# Patient Record
Sex: Male | Born: 1935 | Race: Black or African American | Hispanic: No | Marital: Married | Smoking: Former smoker
Health system: Southern US, Community
[De-identification: ages and names within clinical notes are randomized; demographics above are authoritative.]

## PROBLEM LIST (undated history)

## (undated) ENCOUNTER — Emergency Department (HOSPITAL_COMMUNITY): Admission: EM | Payer: No Typology Code available for payment source | Source: Home / Self Care

## (undated) DIAGNOSIS — A281 Cat-scratch disease: Secondary | ICD-10-CM

## (undated) DIAGNOSIS — R0600 Dyspnea, unspecified: Secondary | ICD-10-CM

## (undated) DIAGNOSIS — I4891 Unspecified atrial fibrillation: Secondary | ICD-10-CM

## (undated) DIAGNOSIS — I10 Essential (primary) hypertension: Secondary | ICD-10-CM

## (undated) DIAGNOSIS — N200 Calculus of kidney: Secondary | ICD-10-CM

## (undated) DIAGNOSIS — I480 Paroxysmal atrial fibrillation: Secondary | ICD-10-CM

## (undated) DIAGNOSIS — H269 Unspecified cataract: Secondary | ICD-10-CM

## (undated) DIAGNOSIS — L304 Erythema intertrigo: Secondary | ICD-10-CM

## (undated) DIAGNOSIS — I509 Heart failure, unspecified: Secondary | ICD-10-CM

## (undated) DIAGNOSIS — J9600 Acute respiratory failure, unspecified whether with hypoxia or hypercapnia: Secondary | ICD-10-CM

## (undated) DIAGNOSIS — E669 Obesity, unspecified: Secondary | ICD-10-CM

## (undated) DIAGNOSIS — N179 Acute kidney failure, unspecified: Secondary | ICD-10-CM

## (undated) DIAGNOSIS — L84 Corns and callosities: Secondary | ICD-10-CM

## (undated) DIAGNOSIS — N189 Chronic kidney disease, unspecified: Secondary | ICD-10-CM

## (undated) DIAGNOSIS — M1990 Unspecified osteoarthritis, unspecified site: Secondary | ICD-10-CM

## (undated) DIAGNOSIS — Z87448 Personal history of other diseases of urinary system: Secondary | ICD-10-CM

## (undated) DIAGNOSIS — E7800 Pure hypercholesterolemia, unspecified: Secondary | ICD-10-CM

## (undated) DIAGNOSIS — M2140 Flat foot [pes planus] (acquired), unspecified foot: Secondary | ICD-10-CM

## (undated) DIAGNOSIS — N289 Disorder of kidney and ureter, unspecified: Secondary | ICD-10-CM

## (undated) DIAGNOSIS — I429 Cardiomyopathy, unspecified: Secondary | ICD-10-CM

## (undated) DIAGNOSIS — M109 Gout, unspecified: Secondary | ICD-10-CM

## (undated) DIAGNOSIS — E119 Type 2 diabetes mellitus without complications: Secondary | ICD-10-CM

## (undated) DIAGNOSIS — I428 Other cardiomyopathies: Secondary | ICD-10-CM

## (undated) HISTORY — PX: CIRCUMCISION: SUR203

## (undated) HISTORY — DX: Cardiomyopathy, unspecified: I42.9

## (undated) HISTORY — DX: Erythema intertrigo: L30.4

## (undated) HISTORY — DX: Personal history of other diseases of urinary system: Z87.448

## (undated) HISTORY — DX: Obesity, unspecified: E66.9

## (undated) HISTORY — DX: Gout, unspecified: M10.9

## (undated) HISTORY — PX: INGUINAL HERNIA REPAIR: SUR1180

## (undated) HISTORY — PX: INCISION AND DRAINAGE OF WOUND: SHX1803

## (undated) HISTORY — PX: KIDNEY STONE SURGERY: SHX686

## (undated) HISTORY — PX: TONSILLECTOMY AND ADENOIDECTOMY: SUR1326

## (undated) HISTORY — DX: Unspecified atrial fibrillation: I48.91

## (undated) HISTORY — DX: Flat foot (pes planus) (acquired), unspecified foot: M21.40

## (undated) HISTORY — DX: Corns and callosities: L84

## (undated) HISTORY — PX: CARDIAC CATHETERIZATION: SHX172

## (undated) NOTE — *Deleted (*Deleted)
Transition of Care (TOC) - Progression Note    Patient Details  Name: Ryan Macdonald MRN: [id number] Date of Birth: 04/29/1934  Transition of Care (TOC) CM/SW Contact  Ryan Ditommaso Hudson, RN Phone Number: 10/06/2020, 2:22 PM  Clinical Narrative:    NCM received call from [facility] admission liaison , Allison. Allison explained to NCM pt with limited VA benefits, 40% service connected. Pt needs @ least 70% service connection in order to receive SNF benefit. Allison explained pt can used his Medicare part A for SNF placement.  Information explained to wife. Wife stated pt recently d/c to [facility] and is now owning $ 1900.00. Pt can't return unless $ 1900.00 is paid prior. Wife states can't afford. Wife give permission for [facility] admission to use Medicare for admit. NCM made [facility] aware. [facility] admission is now reviewing for potential admission under pt's Medicare    Ryan Macdonald (Spouse)      [phone]      TOC team will continue to monitor.....  10/07/2020 1400 NCM called and spoke with [facility] admission liaison, Allison , regarding pt's admission to [facility]. Allison stated business office manager to speak with wife about applying for Medicaid ... hoping pt can potentially qualify before extending acceptance to center.  10/07/2020 1637 NCM received call from [facility] admission. SNF bed will be available for pt tomorrow after 2pm. Wife is aware.  Expected Discharge Plan: Skilled Nursing Facility Barriers to Discharge: No SNF bed  Expected Discharge Plan and Services Expected Discharge Plan: Skilled Nursing Facility       Living arrangements for the past 2 months: Single Family Home                                       Social Determinants of Health (SDOH) Interventions    Readmission Risk Interventions Readmission Risk Prevention Plan 09/25/2020  Transportation Screening Complete  HRI or Home Care Consult  Complete  Social Work Consult for Recovery Care Planning/Counseling Complete  Palliative Care Screening Not Applicable  Medication Review (RN Care Manager) Complete  Some recent data might be hidden   

---

## 2013-10-11 ENCOUNTER — Encounter (HOSPITAL_COMMUNITY): Payer: Self-pay | Admitting: Pharmacy Technician

## 2013-10-17 ENCOUNTER — Encounter (HOSPITAL_COMMUNITY): Payer: Self-pay

## 2013-10-17 ENCOUNTER — Ambulatory Visit (HOSPITAL_COMMUNITY)
Admission: RE | Admit: 2013-10-17 | Discharge: 2013-10-17 | Disposition: A | Discharge status: Home / Self Care | Payer: Non-veteran care | Source: Ambulatory Visit | Attending: Orthopaedic Surgery | Admitting: Orthopaedic Surgery

## 2013-10-17 ENCOUNTER — Encounter (HOSPITAL_COMMUNITY)
Admission: RE | Admit: 2013-10-17 | Discharge: 2013-10-17 | Disposition: A | Discharge status: Home / Self Care | Payer: Non-veteran care | Source: Ambulatory Visit | Attending: Orthopaedic Surgery | Admitting: Orthopaedic Surgery

## 2013-10-17 DIAGNOSIS — Z01818 Encounter for other preprocedural examination: Secondary | ICD-10-CM | POA: Insufficient documentation

## 2013-10-17 DIAGNOSIS — I129 Hypertensive chronic kidney disease with stage 1 through stage 4 chronic kidney disease, or unspecified chronic kidney disease: Secondary | ICD-10-CM | POA: Insufficient documentation

## 2013-10-17 DIAGNOSIS — Z87891 Personal history of nicotine dependence: Secondary | ICD-10-CM | POA: Insufficient documentation

## 2013-10-17 DIAGNOSIS — N189 Chronic kidney disease, unspecified: Secondary | ICD-10-CM | POA: Insufficient documentation

## 2013-10-17 DIAGNOSIS — I509 Heart failure, unspecified: Secondary | ICD-10-CM | POA: Insufficient documentation

## 2013-10-17 HISTORY — DX: Essential (primary) hypertension: I10

## 2013-10-17 HISTORY — DX: Cat-scratch disease: A28.1

## 2013-10-17 HISTORY — DX: Chronic kidney disease, unspecified: N18.9

## 2013-10-17 HISTORY — DX: Unspecified cataract: H26.9

## 2013-10-17 HISTORY — DX: Heart failure, unspecified: I50.9

## 2013-10-17 LAB — COMPREHENSIVE METABOLIC PANEL
AST: 23 U/L (ref 0–37)
Albumin: 3.6 g/dL (ref 3.5–5.2)
Alkaline Phosphatase: 60 U/L (ref 39–117)
CO2: 27 mEq/L (ref 19–32)
Calcium: 9.7 mg/dL (ref 8.4–10.5)
Chloride: 100 mEq/L (ref 96–112)
Creatinine, Ser: 1.25 mg/dL (ref 0.50–1.35)
GFR calc non Af Amer: 54 mL/min — ABNORMAL LOW (ref >90)
Potassium: 4.1 mEq/L (ref 3.5–5.1)
Sodium: 141 mEq/L (ref 135–145)
Total Protein: 8.3 g/dL (ref 6.0–8.3)

## 2013-10-17 LAB — URINALYSIS, ROUTINE W REFLEX MICROSCOPIC
Specific Gravity, Urine: 1.014 (ref 1.005–1.030)
Urobilinogen, UA: 0.2 mg/dL (ref 0.0–1.0)

## 2013-10-17 LAB — TYPE AND SCREEN
ABO/RH(D): O POS
Antibody Screen: NEGATIVE

## 2013-10-17 LAB — CBC
MCH: 32 pg (ref 26.0–34.0)
MCV: 93.5 fL (ref 78.0–100.0)
Platelets: 264 10*3/uL (ref 150–400)
RDW: 13.4 % (ref 11.5–15.5)
WBC: 8.2 10*3/uL (ref 4.0–10.5)

## 2013-10-17 LAB — URINE MICROSCOPIC-ADD ON

## 2013-10-17 LAB — SURGICAL PCR SCREEN
MRSA, PCR: NEGATIVE
Staphylococcus aureus: NEGATIVE

## 2013-10-17 LAB — PROTIME-INR: INR: 0.99 (ref 0.00–1.49)

## 2013-10-17 LAB — ABO/RH: ABO/RH(D): O POS

## 2013-10-17 NOTE — Progress Notes (Signed)
Have requested records from the [place] in [place]...da

## 2013-10-17 NOTE — Pre-Procedure Instructions (Signed)
Ryan Macdonald  10/17/2013   Your procedure is scheduled on: Tuesday, November 25th   Report to [facility]  2 * 3 at 5:30 AM.             (FREE Valet Parking)   Call this number if you have problems the morning of surgery: [phone]   Remember:   Do not eat food or drink liquids after midnight Tuesday.   Take these medicines the morning of surgery with A SIP OF WATER: Carvedilol (Coreg), Isosorbide, Tramadol   Do not wear jewelry, (it will NOT be worn into surgery)  Do not wear lotions, powders, or colognes. You may wear deodorant.   Men may shave face and neck.   Do not bring valuables to the hospital.  [facility] is not responsible for any belongings or valuables.               Contacts, dentures or bridgework may not be worn into surgery.  Leave suitcase in the car. After surgery it may be brought to your room.  For patients admitted to the hospital, discharge time is determined by your treatment team.                 Name and phone number of your driver:    Special Instructions: Shower using CHG 2 nights before surgery and the night before surgery.  If you shower the day of surgery use CHG.  Use special wash - you have one bottle of CHG for all showers.  You should use approximately 1/3 of the bottle for each shower.   Please read over the following fact sheets that you were given: Pain Booklet, Blood Transfusion Information, MRSA Information and Surgical Site Infection Prevention

## 2013-10-17 NOTE — Progress Notes (Signed)
10/17/13 1342  OBSTRUCTIVE SLEEP APNEA  Have you ever been diagnosed with sleep apnea through a sleep study? No  Do you snore loudly (loud enough to be heard through closed doors)?  0  Do you often feel tired, fatigued, or sleepy during the daytime? 0  Has anyone observed you stop breathing during your sleep? 0  Do you have, or are you being treated for high blood pressure? 1  BMI more than 35 kg/m2? 1  Age over 77 years old? 1  Neck circumference greater than 40 cm/18 inches? 1  Gender: 1  Obstructive Sleep Apnea Score 5  Score 4 or greater  Results sent to PCP  He is at risk for OSA

## 2013-10-18 LAB — URINE CULTURE

## 2013-10-18 NOTE — Progress Notes (Signed)
Anesthesia Chart Review:  Patient is a 79 year old male scheduled for left THR on 10/23/13 by Dr. Cleophas Dunker.  History includes morbid obesity, former smoker, CHF with EF 25% by 10/2011 echo, HTN, CKD, nephrolithiasis, Cat Scratch Fever, bilateral cataracts, tonsillectomy, right inguinal hernia repair.  He denies CAD history and reports no CAD by cath > 5 years ago.  OSA screening score was 5. He receives care thru the [facility] PCP is Dr. Randa Ngo.  Patient had a preoperative cardiology evaluation at [facility] on 08/31/13 by Dr. Eliezer Lofts [place]/Dr. Lynnea Ferrier. Patient was NYHA Class II, functional status > 4 METS, and refused an AICD so no further testing was recommended.  By notes, "...he is MODERATE risk for peri-operative cardiac complications for a MODERATE risk surgery, he may proceed to surgery understanding that his risk of events is higher than average."  EKG on 10/17/13 showed NSR with first degree AVB, nonspecific T wave abnormality.  Echo on 11/01/11 showed LV function is severely reduced, EF 25%. RV systolic function is mildly reduced.  Trace AR. Mild to moderate MR. Trace PR. Mild TR.   He reports he became acutely SOB > 5 years ago and was hospitalized and diagnosed with CHF at that time.  Cardiac cath was done then and reportedly showed no CAD.  He has not had a recent stress test.  He denies any new symptoms since his preoperative cardiology visit last month.  CXR on 10/17/13 showed: Heart size is normal. The aorta is unfolded. The pulmonary vascularity is normal. The lungs are clear. No effusions. Chronic degenerative changes affect the spine. No active disease.  Preoperative labs noted.  Urine culture showed no growth.    History and labs reviewed with anesthesiologist Dr. Katrinka Blazing.  Defer any additional lab orders to surgeon.  Patient has been cleared with moderate risk by cardiology.  Further evaluation by his assigned anesthesiologist on the day of surgery, but if no acute  changes or CHF symptoms then it is anticipated that he can proceed as planned.  Of note, patient has concerns about Rehab options. He is also on ASA 81 mg.  I have left a message with Selena Batten at Dr. Hoy Register office to call him if ASA should be held and also to have someone provide follow-up regarding post-operative rehab.   Velna Ochs [facility]/Anesthesiology Phone [phone] 10/18/2013 4:55 PM

## 2013-10-18 NOTE — H&P (Signed)
CHIEF COMPLAINT:  Painful left hip.   HISTORY OF PRESENT ILLNESS:  Ryan Macdonald is a very pleasant 79 year old African American male who was seen today for evaluation of his left hip.  He has had problems with his left hip in regard to pain probably for over the last 15-18 years.  He was initially treated in the [facility] in [place] and also [place], [place].  At one point he actually suggested that he undergo a replacement, but he was not ready any of those times.  He was just having so much pain and inconvenience now that he returns for further evaluation.  He has pain in the left groin and some referred to the left knee just proximal to it.  He is not really having much in the way of numbness and tingling.  He has to walk with a cane now.  He has pain with every step.  He has markedly limited range of motion secondary to pain.  He denies any neurovascular compromise at this time.  Seen today for re-evaluation.  Health history has been reviewed.    PAST SURGICAL HISTORY:   In 1955 right inguinal herniorrhaphy, 1975 for renal calculi, 1983 renal calculi, 1989 for a cat scratch mass on the right neck with excision.   CURRENT MEDICATIONS:   Tylenol 500 mg 1 tablet twice a day, Carvedilol 25 mg 1/2 tab b.i.d., B12, 1000 mg 2 daily, furosemide 40 mg 1 b.i.d., hydralazine 10 mg 3 times a day, isosorbide 40 mg 1/2 tablet 3 times a day, tramadol 50 mg 4 times a day, tamsulosin 0.4 mg HS, spironolactone 25 mg HS, simvastatin 80 mg 1/2 tablet nightly, Niacin-Slo at 1 tablet at night.   ALLERGIES:  Lisinopril.   REVIEW OF SYSTEMS:   A 14-point review of systems is positive for bilateral cataracts without excision at this time.  He does wear glasses and has a full set of dentures.  He has had congestive heart failure for 12 years, they state 75%.  Hypertension over the past 12 years which apparently is now controlled.  Previous history of whooping cough.  He did have renal calculi x2.  Bladder infection 2 months  ago.  Nocturia about 4 times per evening.  He also has some mild depression.   FAMILY HISTORY:   Positive for a mother who died age 74 from a cerebral hemorrhage.  Father is deceased at 20+ with pneumothorax on the left.  Brother who died at 63 months of age, crib death.  He has 2 sisters, 1 who died at age 73 from lupus and renal failure and the other one died at the age of 60+ from diabetes, blindness, congestive heart failure and then bled to death apparently after amputation.   SOCIAL HISTORY:   Ryan Macdonald is a very pleasant 79 year old African American male who is married.  He drinks beer about 12 a month, but does not use tobacco.   PHYSICAL EXAMINATION:  A 79 year old [place] American male, well-developed, well-nourished, obese, alert, pleasant, cooperative in moderate distress secondary to left hip and groin pain.     Height is 65 inches, weight 266 pounds, BMI is 44.3.    Vital signs reveals a temperature of 98, pulse 80, respirations 16, blood pressure 130/80.   Head is normocephalic.   Eyes:  Pupils equal, round and reactive to light and accommodation with extraocular movements intact.   Ears, nose and throat were benign. Neck was supple, no thyromegaly.  No bruits were noted. Chest had  good expansion. Lungs had distant breath sounds, but were clear.   Cardiac had a regular rhythm and rate, distant heart sounds. Pulses were trace in the lower extremities at best. Abdomen is obese, soft, nontender, no mass is palpable.  Normal bowel sounds were present.  Genital, rectal and breast exam was not indicated for a orthopedic evaluation. CNS:  He appears to be oriented x3 and cranial nerves II-XII grossly intact.    Musculoskeletal:  At this time he has very limited internal and external rotation of the left hip.  I can flex him to about 90 degrees.  He walks with the left foot externally rotated.     CLINICAL IMPRESSION:   1.  Advanced DJD of the left hip. 2.  History of congestive  heart failure. 3.  History of hypertension. 4.  Recent bladder infection. 5.  History of depression.   RECOMMENDATIONS:  At this time it is felt that he is a candidate for a total hip replacement on the left.  Procedure, risks and benefits were fully explained to him.  He is understanding.  I spent 45 minutes with him going over the entire procedure using appropriate models to show him what would be done and the prosthesis that would be used.  All questions were answered.  He would like to proceed with this in the very near future.   Ryan Macdonald [facility] [phone]  10/18/2013 4:02 PM

## 2013-10-22 MED ORDER — SODIUM CHLORIDE 0.9 % IV SOLN
INTRAVENOUS | Status: DC
  Administered 2013-10-23: 75 mL/h via INTRAVENOUS

## 2013-10-22 MED ORDER — DEXTROSE 5 % IV SOLN
3.0000 g | INTRAVENOUS | Status: AC
  Administered 2013-10-23: 3 g via INTRAVENOUS
  Filled 2013-10-22: qty 3000

## 2013-10-22 MED ORDER — CHLORHEXIDINE GLUCONATE 4 % EX LIQD: 60.0000 mL | Freq: Once | CUTANEOUS | Status: DC

## 2013-10-22 MED ORDER — CHLORHEXIDINE GLUCONATE 4 % EX LIQD: 60.0000 mL | Freq: Every day | CUTANEOUS | Status: DC

## 2013-10-23 ENCOUNTER — Encounter (HOSPITAL_COMMUNITY)
Admission: RE | Disposition: A | Discharge status: Home Health | Payer: Self-pay | Source: Ambulatory Visit | Attending: Orthopaedic Surgery

## 2013-10-23 ENCOUNTER — Inpatient Hospital Stay (HOSPITAL_COMMUNITY)
Admission: RE | Admit: 2013-10-23 | Discharge: 2013-10-26 | DRG: 470 | Disposition: A | Discharge status: Home Health | Payer: Medicare Other | Source: Ambulatory Visit | Attending: Orthopaedic Surgery | Admitting: Orthopaedic Surgery

## 2013-10-23 ENCOUNTER — Encounter (HOSPITAL_COMMUNITY): Payer: Self-pay | Admitting: Certified Registered Nurse Anesthetist

## 2013-10-23 ENCOUNTER — Inpatient Hospital Stay (HOSPITAL_COMMUNITY): Payer: Medicare Other | Admitting: Certified Registered Nurse Anesthetist

## 2013-10-23 ENCOUNTER — Inpatient Hospital Stay (HOSPITAL_COMMUNITY): Payer: Medicare Other

## 2013-10-23 ENCOUNTER — Encounter (HOSPITAL_COMMUNITY): Payer: Medicare Other | Admitting: Vascular Surgery

## 2013-10-23 DIAGNOSIS — H269 Unspecified cataract: Secondary | ICD-10-CM | POA: Diagnosis present

## 2013-10-23 DIAGNOSIS — Z6841 Body Mass Index (BMI) 40.0 and over, adult: Secondary | ICD-10-CM

## 2013-10-23 DIAGNOSIS — Z87442 Personal history of urinary calculi: Secondary | ICD-10-CM

## 2013-10-23 DIAGNOSIS — F329 Major depressive disorder, single episode, unspecified: Secondary | ICD-10-CM | POA: Diagnosis present

## 2013-10-23 DIAGNOSIS — E669 Obesity, unspecified: Secondary | ICD-10-CM | POA: Diagnosis present

## 2013-10-23 DIAGNOSIS — I480 Paroxysmal atrial fibrillation: Secondary | ICD-10-CM | POA: Diagnosis present

## 2013-10-23 DIAGNOSIS — I509 Heart failure, unspecified: Secondary | ICD-10-CM | POA: Diagnosis present

## 2013-10-23 DIAGNOSIS — E7800 Pure hypercholesterolemia, unspecified: Secondary | ICD-10-CM | POA: Diagnosis present

## 2013-10-23 DIAGNOSIS — N401 Enlarged prostate with lower urinary tract symptoms: Secondary | ICD-10-CM | POA: Diagnosis present

## 2013-10-23 DIAGNOSIS — Z96649 Presence of unspecified artificial hip joint: Secondary | ICD-10-CM

## 2013-10-23 DIAGNOSIS — M1612 Unilateral primary osteoarthritis, left hip: Secondary | ICD-10-CM | POA: Diagnosis present

## 2013-10-23 DIAGNOSIS — I4891 Unspecified atrial fibrillation: Secondary | ICD-10-CM | POA: Diagnosis present

## 2013-10-23 DIAGNOSIS — I10 Essential (primary) hypertension: Secondary | ICD-10-CM | POA: Diagnosis present

## 2013-10-23 DIAGNOSIS — I428 Other cardiomyopathies: Secondary | ICD-10-CM | POA: Diagnosis present

## 2013-10-23 DIAGNOSIS — N138 Other obstructive and reflux uropathy: Secondary | ICD-10-CM | POA: Diagnosis present

## 2013-10-23 DIAGNOSIS — M1610 Unilateral primary osteoarthritis, unspecified hip: Principal | ICD-10-CM | POA: Diagnosis present

## 2013-10-23 DIAGNOSIS — Z79899 Other long term (current) drug therapy: Secondary | ICD-10-CM

## 2013-10-23 DIAGNOSIS — IMO0002 Reserved for concepts with insufficient information to code with codable children: Secondary | ICD-10-CM | POA: Diagnosis present

## 2013-10-23 DIAGNOSIS — F3289 Other specified depressive episodes: Secondary | ICD-10-CM | POA: Diagnosis present

## 2013-10-23 DIAGNOSIS — Z87891 Personal history of nicotine dependence: Secondary | ICD-10-CM

## 2013-10-23 DIAGNOSIS — Z7901 Long term (current) use of anticoagulants: Secondary | ICD-10-CM

## 2013-10-23 DIAGNOSIS — D62 Acute posthemorrhagic anemia: Secondary | ICD-10-CM | POA: Diagnosis not present

## 2013-10-23 DIAGNOSIS — M169 Osteoarthritis of hip, unspecified: Principal | ICD-10-CM | POA: Diagnosis present

## 2013-10-23 DIAGNOSIS — Z833 Family history of diabetes mellitus: Secondary | ICD-10-CM

## 2013-10-23 HISTORY — DX: Calculus of kidney: N20.0

## 2013-10-23 HISTORY — PX: TOTAL HIP ARTHROPLASTY: SHX124

## 2013-10-23 HISTORY — PX: CYSTOSCOPY WITH URETHRAL DILATATION: SHX5125

## 2013-10-23 HISTORY — DX: Other cardiomyopathies: I42.8

## 2013-10-23 HISTORY — DX: Unspecified osteoarthritis, unspecified site: M19.90

## 2013-10-23 HISTORY — DX: Pure hypercholesterolemia, unspecified: E78.00

## 2013-10-23 SURGERY — ARTHROPLASTY, HIP, TOTAL,POSTERIOR APPROACH
Anesthesia: General | Site: Penis

## 2013-10-23 MED ORDER — SPIRONOLACTONE 25 MG PO TABS
25.0000 mg | ORAL_TABLET | Freq: Every day | ORAL | Status: DC
  Administered 2013-10-23 – 2013-10-25 (×3): 25 mg via ORAL
  Filled 2013-10-23 (×4): qty 1

## 2013-10-23 MED ORDER — ONDANSETRON HCL 4 MG/2ML IJ SOLN
INTRAMUSCULAR | Status: DC | PRN
  Administered 2013-10-23: 4 mg via INTRAVENOUS

## 2013-10-23 MED ORDER — METHOCARBAMOL 100 MG/ML IJ SOLN
500.0000 mg | Freq: Four times a day (QID) | INTRAVENOUS | Status: DC | PRN
  Administered 2013-10-23: 500 mg via INTRAVENOUS
  Filled 2013-10-23: qty 5

## 2013-10-23 MED ORDER — PROMETHAZINE HCL 25 MG/ML IJ SOLN: 6.2500 mg | INTRAMUSCULAR | Status: DC | PRN

## 2013-10-23 MED ORDER — ALBUMIN HUMAN 5 % IV SOLN
INTRAVENOUS | Status: DC | PRN
  Administered 2013-10-23: 09:00:00 via INTRAVENOUS

## 2013-10-23 MED ORDER — BISACODYL 10 MG RE SUPP: 10.0000 mg | Freq: Every day | RECTAL | Status: DC | PRN

## 2013-10-23 MED ORDER — RIVAROXABAN 10 MG PO TABS
10.0000 mg | ORAL_TABLET | ORAL | Status: DC
  Administered 2013-10-23: 10 mg via ORAL
  Filled 2013-10-23 (×2): qty 1

## 2013-10-23 MED ORDER — OXYCODONE HCL 5 MG PO TABS
5.0000 mg | ORAL_TABLET | ORAL | Status: DC | PRN
  Administered 2013-10-23 – 2013-10-26 (×9): 10 mg via ORAL
  Filled 2013-10-23 (×4): qty 2
  Filled 2013-10-23: qty 1
  Filled 2013-10-23: qty 2
  Filled 2013-10-23: qty 1
  Filled 2013-10-23 (×3): qty 2

## 2013-10-23 MED ORDER — BUPIVACAINE-EPINEPHRINE (PF) 0.25% -1:200000 IJ SOLN
INTRAMUSCULAR | Status: AC
  Filled 2013-10-23: qty 30

## 2013-10-23 MED ORDER — ALUM & MAG HYDROXIDE-SIMETH 200-200-20 MG/5ML PO SUSP: 30.0000 mL | ORAL | Status: DC | PRN

## 2013-10-23 MED ORDER — ACETAMINOPHEN 325 MG PO TABS: 650.0000 mg | ORAL_TABLET | Freq: Four times a day (QID) | ORAL | Status: DC | PRN

## 2013-10-23 MED ORDER — ONDANSETRON HCL 4 MG/2ML IJ SOLN: 4.0000 mg | Freq: Four times a day (QID) | INTRAMUSCULAR | Status: DC | PRN

## 2013-10-23 MED ORDER — CEFAZOLIN SODIUM-DEXTROSE 2-3 GM-% IV SOLR
2.0000 g | Freq: Four times a day (QID) | INTRAVENOUS | Status: AC
  Administered 2013-10-23 (×2): 2 g via INTRAVENOUS
  Filled 2013-10-23 (×2): qty 50

## 2013-10-23 MED ORDER — CARVEDILOL 25 MG PO TABS
25.0000 mg | ORAL_TABLET | Freq: Two times a day (BID) | ORAL | Status: DC
  Administered 2013-10-23 – 2013-10-26 (×6): 25 mg via ORAL
  Filled 2013-10-23 (×8): qty 1

## 2013-10-23 MED ORDER — OXYCODONE HCL 5 MG/5ML PO SOLN: 5.0000 mg | Freq: Once | ORAL | Status: DC | PRN

## 2013-10-23 MED ORDER — DOCUSATE SODIUM 100 MG PO CAPS
100.0000 mg | ORAL_CAPSULE | Freq: Two times a day (BID) | ORAL | Status: DC
  Administered 2013-10-23 – 2013-10-26 (×7): 100 mg via ORAL
  Filled 2013-10-23 (×8): qty 1

## 2013-10-23 MED ORDER — GLYCOPYRROLATE 0.2 MG/ML IJ SOLN
INTRAMUSCULAR | Status: DC | PRN
  Administered 2013-10-23: .8 mg via INTRAVENOUS

## 2013-10-23 MED ORDER — STERILE WATER FOR IRRIGATION IR SOLN
Status: DC | PRN
  Administered 2013-10-23: 1000 mL

## 2013-10-23 MED ORDER — MENTHOL 3 MG MT LOZG: 1.0000 | LOZENGE | OROMUCOSAL | Status: DC | PRN

## 2013-10-23 MED ORDER — LIDOCAINE HCL (CARDIAC) 20 MG/ML IV SOLN
INTRAVENOUS | Status: DC | PRN
  Administered 2013-10-23: 80 mg via INTRAVENOUS

## 2013-10-23 MED ORDER — ISOSORBIDE DINITRATE ER 40 MG PO TBCR
40.0000 mg | EXTENDED_RELEASE_TABLET | Freq: Three times a day (TID) | ORAL | Status: DC
  Administered 2013-10-23: 40 mg via ORAL
  Filled 2013-10-23 (×6): qty 1

## 2013-10-23 MED ORDER — NIACIN ER (ANTIHYPERLIPIDEMIC) 500 MG PO TBCR: 500.0000 mg | EXTENDED_RELEASE_TABLET | Freq: Every day | ORAL | Status: DC

## 2013-10-23 MED ORDER — TAMSULOSIN HCL 0.4 MG PO CAPS
0.4000 mg | ORAL_CAPSULE | Freq: Every day | ORAL | Status: DC
  Administered 2013-10-23 – 2013-10-25 (×3): 0.4 mg via ORAL
  Filled 2013-10-23 (×4): qty 1

## 2013-10-23 MED ORDER — PHENYLEPHRINE HCL 10 MG/ML IJ SOLN
INTRAMUSCULAR | Status: DC | PRN
  Administered 2013-10-23 (×3): 80 ug via INTRAVENOUS
  Administered 2013-10-23 (×2): 40 ug via INTRAVENOUS
  Administered 2013-10-23: 80 ug via INTRAVENOUS

## 2013-10-23 MED ORDER — PHENYLEPHRINE HCL 10 MG/ML IJ SOLN
10.0000 mg | INTRAVENOUS | Status: DC | PRN
  Administered 2013-10-23: 25 ug/min via INTRAVENOUS

## 2013-10-23 MED ORDER — SODIUM CHLORIDE 0.9 % IV SOLN
75.0000 mL/h | INTRAVENOUS | Status: DC
  Administered 2013-10-23 – 2013-10-24 (×2): 75 mL/h via INTRAVENOUS

## 2013-10-23 MED ORDER — METOCLOPRAMIDE HCL 5 MG/ML IJ SOLN: 5.0000 mg | Freq: Three times a day (TID) | INTRAMUSCULAR | Status: DC | PRN

## 2013-10-23 MED ORDER — 0.9 % SODIUM CHLORIDE (POUR BTL) OPTIME
TOPICAL | Status: DC | PRN
  Administered 2013-10-23 (×2): 1000 mL

## 2013-10-23 MED ORDER — NIACIN ER 500 MG PO CPCR
500.0000 mg | ORAL_CAPSULE | Freq: Every day | ORAL | Status: DC
  Administered 2013-10-23 – 2013-10-25 (×3): 500 mg via ORAL
  Filled 2013-10-23 (×4): qty 1

## 2013-10-23 MED ORDER — NEOSTIGMINE METHYLSULFATE 1 MG/ML IJ SOLN
INTRAMUSCULAR | Status: DC | PRN
  Administered 2013-10-23: 5 mg via INTRAVENOUS

## 2013-10-23 MED ORDER — HYDROMORPHONE HCL PF 1 MG/ML IJ SOLN: 0.5000 mg | INTRAMUSCULAR | Status: DC | PRN

## 2013-10-23 MED ORDER — ONDANSETRON HCL 4 MG PO TABS: 4.0000 mg | ORAL_TABLET | Freq: Four times a day (QID) | ORAL | Status: DC | PRN

## 2013-10-23 MED ORDER — KETOROLAC TROMETHAMINE 15 MG/ML IJ SOLN
7.5000 mg | Freq: Four times a day (QID) | INTRAMUSCULAR | Status: AC
  Administered 2013-10-23 – 2013-10-24 (×4): 7.5 mg via INTRAVENOUS
  Filled 2013-10-23 (×3): qty 1

## 2013-10-23 MED ORDER — ACETAMINOPHEN 650 MG RE SUPP: 650.0000 mg | Freq: Four times a day (QID) | RECTAL | Status: DC | PRN

## 2013-10-23 MED ORDER — METHOCARBAMOL 500 MG PO TABS
500.0000 mg | ORAL_TABLET | Freq: Four times a day (QID) | ORAL | Status: DC | PRN
  Administered 2013-10-26: 500 mg via ORAL
  Filled 2013-10-23 (×2): qty 1

## 2013-10-23 MED ORDER — HYDROMORPHONE HCL PF 1 MG/ML IJ SOLN
0.2500 mg | INTRAMUSCULAR | Status: DC | PRN
  Administered 2013-10-23 (×2): 0.5 mg via INTRAVENOUS

## 2013-10-23 MED ORDER — KETOROLAC TROMETHAMINE 30 MG/ML IJ SOLN
INTRAMUSCULAR | Status: AC
  Filled 2013-10-23: qty 1

## 2013-10-23 MED ORDER — FLEET ENEMA 7-19 GM/118ML RE ENEM: 1.0000 | ENEMA | Freq: Once | RECTAL | Status: AC | PRN

## 2013-10-23 MED ORDER — FUROSEMIDE 40 MG PO TABS
40.0000 mg | ORAL_TABLET | Freq: Two times a day (BID) | ORAL | Status: DC
  Administered 2013-10-23 – 2013-10-26 (×6): 40 mg via ORAL
  Filled 2013-10-23 (×8): qty 1

## 2013-10-23 MED ORDER — MAGNESIUM HYDROXIDE 400 MG/5ML PO SUSP
30.0000 mL | Freq: Every day | ORAL | Status: DC | PRN
  Administered 2013-10-25: 30 mL via ORAL
  Filled 2013-10-23: qty 30

## 2013-10-23 MED ORDER — PHENOL 1.4 % MT LIQD: 1.0000 | OROMUCOSAL | Status: DC | PRN

## 2013-10-23 MED ORDER — FENTANYL CITRATE 0.05 MG/ML IJ SOLN
INTRAMUSCULAR | Status: DC | PRN
  Administered 2013-10-23 (×2): 50 ug via INTRAVENOUS
  Administered 2013-10-23: 150 ug via INTRAVENOUS
  Administered 2013-10-23 (×3): 50 ug via INTRAVENOUS

## 2013-10-23 MED ORDER — ROCURONIUM BROMIDE 100 MG/10ML IV SOLN
INTRAVENOUS | Status: DC | PRN
  Administered 2013-10-23: 10 mg via INTRAVENOUS
  Administered 2013-10-23: 20 mg via INTRAVENOUS
  Administered 2013-10-23: 50 mg via INTRAVENOUS
  Administered 2013-10-23: 20 mg via INTRAVENOUS

## 2013-10-23 MED ORDER — PROPOFOL 10 MG/ML IV BOLUS
INTRAVENOUS | Status: DC | PRN
  Administered 2013-10-23: 150 mg via INTRAVENOUS

## 2013-10-23 MED ORDER — OXYCODONE HCL 5 MG PO TABS
5.0000 mg | ORAL_TABLET | Freq: Once | ORAL | Status: DC | PRN
Start: 2013-10-23 — End: 2013-10-23

## 2013-10-23 MED ORDER — HYDROMORPHONE HCL PF 1 MG/ML IJ SOLN
INTRAMUSCULAR | Status: AC
  Filled 2013-10-23: qty 1

## 2013-10-23 MED ORDER — BUPIVACAINE-EPINEPHRINE PF 0.25-1:200000 % IJ SOLN
INTRAMUSCULAR | Status: DC | PRN
  Administered 2013-10-23: 10 mL

## 2013-10-23 MED ORDER — LACTATED RINGERS IV SOLN
INTRAVENOUS | Status: DC | PRN
  Administered 2013-10-23: 07:00:00 via INTRAVENOUS

## 2013-10-23 MED ORDER — MIDAZOLAM HCL 5 MG/5ML IJ SOLN
INTRAMUSCULAR | Status: DC | PRN
  Administered 2013-10-23: 1 mg via INTRAVENOUS

## 2013-10-23 MED ORDER — METOCLOPRAMIDE HCL 10 MG PO TABS: 5.0000 mg | ORAL_TABLET | Freq: Three times a day (TID) | ORAL | Status: DC | PRN

## 2013-10-23 MED ORDER — HYDRALAZINE HCL 10 MG PO TABS
20.0000 mg | ORAL_TABLET | Freq: Two times a day (BID) | ORAL | Status: DC
  Administered 2013-10-23 – 2013-10-26 (×7): 20 mg via ORAL
  Filled 2013-10-23 (×8): qty 2

## 2013-10-23 SURGICAL SUPPLY — 68 items
BLADE SAW SAG 73X25 THK (BLADE) ×1
BLADE SAW SGTL 73X25 THK (BLADE) ×2 IMPLANT
BNDG COHESIVE 6X5 TAN STRL LF (GAUZE/BANDAGES/DRESSINGS) ×3 IMPLANT
BRUSH FEMORAL CANAL (MISCELLANEOUS) IMPLANT
CAPT HIP PF COP ×3 IMPLANT
CATH FOLEY 2W COUNCIL 5CC 18FR (CATHETERS) ×3 IMPLANT
CLOTH BEACON ORANGE TIMEOUT ST (SAFETY) ×3 IMPLANT
COVER BACK TABLE 24X17X13 BIG (DRAPES) IMPLANT
COVER SURGICAL LIGHT HANDLE (MISCELLANEOUS) ×3 IMPLANT
DRAPE INCISE IOBAN 66X45 STRL (DRAPES) ×3 IMPLANT
DRAPE ORTHO SPLIT 77X108 STRL (DRAPES) ×2
DRAPE SURG ORHT 6 SPLT 77X108 (DRAPES) ×4 IMPLANT
DRSG MEPILEX BORDER 4X12 (GAUZE/BANDAGES/DRESSINGS) ×3 IMPLANT
DURAPREP 26ML APPLICATOR (WOUND CARE) ×6 IMPLANT
ELECT BLADE 6.5 EXT (BLADE) ×3 IMPLANT
ELECT REM PT RETURN 9FT ADLT (ELECTROSURGICAL) ×3
ELECTRODE REM PT RTRN 9FT ADLT (ELECTROSURGICAL) ×2 IMPLANT
EVACUATOR 1/8 PVC DRAIN (DRAIN) IMPLANT
FACESHIELD LNG OPTICON STERILE (SAFETY) ×9 IMPLANT
GLOVE BIO SURGEON STRL SZ 6.5 (GLOVE) ×3 IMPLANT
GLOVE BIO SURGEON STRL SZ7 (GLOVE) ×3 IMPLANT
GLOVE BIOGEL PI IND STRL 7.0 (GLOVE) ×4 IMPLANT
GLOVE BIOGEL PI IND STRL 8 (GLOVE) ×4 IMPLANT
GLOVE BIOGEL PI IND STRL 8.5 (GLOVE) ×2 IMPLANT
GLOVE BIOGEL PI INDICATOR 7.0 (GLOVE) ×2
GLOVE BIOGEL PI INDICATOR 8 (GLOVE) ×2
GLOVE BIOGEL PI INDICATOR 8.5 (GLOVE) ×1
GLOVE BIOGEL PI ORTHO PRO SZ8 (GLOVE) ×1
GLOVE ECLIPSE 8.0 STRL XLNG CF (GLOVE) ×6 IMPLANT
GLOVE PI ORTHO PRO STRL SZ8 (GLOVE) ×2 IMPLANT
GLOVE SURG ORTHO 8.5 STRL (GLOVE) ×6 IMPLANT
GOWN PREVENTION PLUS XXLARGE (GOWN DISPOSABLE) ×6 IMPLANT
GOWN STRL NON-REIN LRG LVL3 (GOWN DISPOSABLE) ×6 IMPLANT
GOWN STRL REIN XL XLG (GOWN DISPOSABLE) ×6 IMPLANT
GUIDEWIRE STR DUAL SENSOR (WIRE) ×3 IMPLANT
HANDPIECE INTERPULSE COAX TIP (DISPOSABLE)
IMMOBILIZER KNEE 20 (SOFTGOODS)
IMMOBILIZER KNEE 20 THIGH 36 (SOFTGOODS) IMPLANT
IMMOBILIZER KNEE 22 UNIV (SOFTGOODS) ×3 IMPLANT
IMMOBILIZER KNEE 24 THIGH 36 (MISCELLANEOUS) IMPLANT
IMMOBILIZER KNEE 24 UNIV (MISCELLANEOUS)
KIT BASIN OR (CUSTOM PROCEDURE TRAY) ×3 IMPLANT
KIT ROOM TURNOVER OR (KITS) ×3 IMPLANT
MANIFOLD NEPTUNE II (INSTRUMENTS) ×3 IMPLANT
NEEDLE 22X1 1/2 (OR ONLY) (NEEDLE) ×3 IMPLANT
NS IRRIG 1000ML POUR BTL (IV SOLUTION) ×3 IMPLANT
PACK TOTAL JOINT (CUSTOM PROCEDURE TRAY) ×3 IMPLANT
PAD ARMBOARD 7.5X6 YLW CONV (MISCELLANEOUS) ×9 IMPLANT
PRESSURIZER FEMORAL UNIV (MISCELLANEOUS) IMPLANT
SET HNDPC FAN SPRY TIP SCT (DISPOSABLE) IMPLANT
STAPLER VISISTAT 35W (STAPLE) ×3 IMPLANT
SUCTION FRAZIER TIP 10 FR DISP (SUCTIONS) ×3 IMPLANT
SUT BONE WAX W31G (SUTURE) ×3 IMPLANT
SUT ETHIBOND NAB CT1 #1 30IN (SUTURE) ×9 IMPLANT
SUT MNCRL AB 3-0 PS2 18 (SUTURE) ×3 IMPLANT
SUT VIC AB 0 CT1 27 (SUTURE) ×1
SUT VIC AB 0 CT1 27XBRD ANBCTR (SUTURE) ×2 IMPLANT
SUT VIC AB 1 CT1 27 (SUTURE) ×1
SUT VIC AB 1 CT1 27XBRD ANBCTR (SUTURE) ×2 IMPLANT
SUT VIC AB 2-0 CT1 27 (SUTURE) ×1
SUT VIC AB 2-0 CT1 TAPERPNT 27 (SUTURE) ×2 IMPLANT
SYR CONTROL 10ML LL (SYRINGE) ×3 IMPLANT
TOWEL OR 17X24 6PK STRL BLUE (TOWEL DISPOSABLE) ×6 IMPLANT
TOWEL OR 17X26 10 PK STRL BLUE (TOWEL DISPOSABLE) ×3 IMPLANT
TOWER CARTRIDGE SMART MIX (DISPOSABLE) IMPLANT
TRAY FOLEY CATH 14FR (SET/KITS/TRAYS/PACK) ×3 IMPLANT
TRAY FOLEY CATH 16FRSI W/METER (SET/KITS/TRAYS/PACK) ×3 IMPLANT
WATER STERILE IRR 1000ML POUR (IV SOLUTION) ×6 IMPLANT

## 2013-10-23 NOTE — Anesthesia Procedure Notes (Addendum)
Procedure Name: Intubation Date/Time: 10/23/2013 7:39 AM Performed by: Valeria Batman Pre-anesthesia Checklist: Patient identified, Emergency Drugs available, Suction available and Patient being monitored Patient Re-evaluated:Patient Re-evaluated prior to inductionOxygen Delivery Method: Circle system utilized Preoxygenation: Pre-oxygenation with 100% oxygen Intubation Type: IV induction Ventilation: Mask ventilation without difficulty and Oral airway inserted - appropriate to patient size Laryngoscope Size: Hyacinth Meeker and 2 Grade View: Grade I Tube type: Oral Tube size: 7.5 mm Number of attempts: 1 Airway Equipment and Method: Stylet and Oral airway Placement Confirmation: ETT inserted through vocal cords under direct vision,  positive ETCO2 and breath sounds checked- equal and bilateral Secured at: 23 cm Tube secured with: Tape Dental Injury: Teeth and Oropharynx as per pre-operative assessment  Comments: Intubation performed by Mayford Knife, CRNA

## 2013-10-23 NOTE — Preoperative (Signed)
Beta Blockers   Reason not to administer Beta Blockers:Not Applicable, patient took Carvedilol 10/23/13 in am.

## 2013-10-23 NOTE — Anesthesia Postprocedure Evaluation (Signed)
Anesthesia Post Note  Patient: Ryan Macdonald  Procedure(s) Performed: Procedure(s) (LRB): TOTAL HIP ARTHROPLASTY (Left) CYSTOSCOPY WITH URETHRAL DILATATION (N/A)  Anesthesia type: general  Patient location: PACU  Post pain: Pain level controlled  Post assessment: Patient's Cardiovascular Status Stable  Post vital signs: Reviewed and stable  Level of consciousness: sedated  Complications: No apparent anesthesia complications

## 2013-10-23 NOTE — H&P (Signed)
  The recent History & Physical has been reviewed. I have personally examined the patient today. There is no interval change to the documented History & Physical. The patient would like to proceed with the procedure.  Norlene Campbell W 10/23/2013,  7:19 AM

## 2013-10-23 NOTE — Consult Note (Signed)
Urology Consult  Referring physician:  Cleophas Dunker Reason for referral:   Inability to pass foley catheter  Chief Complaint: BPH  History of Present Illness:   79 yo hypertensive AA male seen as intraoperative consultation for Dr. Norlene Campbell because of inability to pass elective foley catheter prior to knee surgery. Mr Rafferty has a significant urologic hx of BPH Rx Flo45max, and currently complains of nocturia x 4, with recent UTi 2 months ago. He has had renal calculi x 2, in 1975, and 1983, both treated at the [place], [place] [facility]    In the OR this AM, multiple size foley catheters were passed, but could not be passed into the bladder, making neccessary an emergency Urologic consultation.   Past Medical History  Diagnosis Date  . CHF (congestive heart failure)     goes to [place] in [place]  . Hypertension   . Chronic kidney disease     h/o kidney stones  only  . Cat scratch fever     removed mass on right side neck  . Cataracts, bilateral    Past Surgical History  Procedure Laterality Date  . Kidney stones-- right     . Hernia repair      right inguinal  . Tonsillectomy      Medications: I have reviewed the patient's current medications. Allergies:  Allergies  Allergen Reactions  . Lisinopril Swelling    History reviewed. No pertinent family history. Social History:  reports that he has quit smoking. His smoking use included Cigarettes. He has a 40 pack-year smoking history. He quit smokeless tobacco use about 50 years ago. He reports that he does not drink alcohol or use illicit drugs.  ROS: All systems are reviewed and negative except as noted. Nocturia x 4. No hx of GC. No hx of cancer prostate.   Physical Exam:  Vital signs in last 24 hours: Temp:  [97.1 F (36.2 C)] 97.1 F (36.2 C) (11/25 0635) Pulse Rate:  [83] 83 (11/25 0635) Resp:  [18] 18 (11/25 0635) BP: (170)/(96) 170/96 mmHg (11/25 0635) SpO2:  [97 %] 97 % (11/25 1610)  Cardiovascular: Skin warm; not  flushed Respiratory: Breaths quiet; no shortness of breath Abdomen: No masses Neurological: Normal sensation to touch Musculoskeletal: Normal motor function arms and legs Lymphatics: No inguinal adenopathy Skin: No rashes Genitourinary: Normal meatus and glans.   Laboratory Data:  No results found for this or any previous visit (from the past 72 hour(s)). Recent Results (from the past 240 hour(s))  URINE CULTURE     Status: None   Collection Time    10/17/13  2:02 PM      Result Value Range Status   Specimen Description URINE, CLEAN CATCH   Final   Special Requests NONE   Final   Culture  Setup Time     Final   Value: 10/17/2013 16:18     Performed at [facility] Count     Final   Value: NO GROWTH     Performed at [facility]   Culture     Final   Value: NO GROWTH     Performed at [facility]   Report Status 10/18/2013 FINAL   Final  SURGICAL PCR SCREEN     Status: None   Collection Time    10/17/13  2:13 PM      Result Value Range Status   MRSA, PCR NEGATIVE  NEGATIVE Final   Staphylococcus aureus NEGATIVE  NEGATIVE  Final   Comment:            The Xpert SA Assay (FDA     approved for NASAL specimens     in patients over 19 years of age),     is one component of     a comprehensive surveillance     program.  Test performance has     been validated by [employer] for patients greater     than or equal to 35 year old.     It is not intended     to diagnose infection nor to     guide or monitor treatment.   Creatinine:  Recent Labs  10/17/13 1403  CREATININE 1.25    Xrays: See report/chart   Impression/Assessment:  Urethral stricture. Plan for flexible cysto and catheter placement.   Plan:  Bedside cysto and catheter placement.   Caydon Feasel I 10/23/2013, 8:50 AM

## 2013-10-23 NOTE — Op Note (Signed)
PATIENT ID:      Ryan Macdonald  MRN:     [id number] DOB/AGE:    [birth year] / 79 y.o.       OPERATIVE REPORT    DATE OF PROCEDURE:  10/23/2013       PREOPERATIVE DIAGNOSIS:   END STAGE OSTEOARTHRITIS LEFT HIP                                                       bmi 44     POSTOPERATIVE DIAGNOSIS:   LEFT HIP OSTEOARTHRITIS-SAME                                                                     There is no weight on file to calculate BMI.     PROCEDURE:  Procedure(s): TOTAL HIP ARTHROPLASTY-LEFT CYSTOSCOPY WITH URETHRAL DILATATION (Dr Patsi Sears)     SURGEON:  Norlene Campbell, MD    ASSISTANT:   Jacqualine Code, PA-C   (Present and scrubbed throughout the case, critical for assistance with exposure, retraction, instrumentation, and closure.)          ANESTHESIA: general     DRAINS: none :      TOURNIQUET TIME: * No tourniquets in log *    COMPLICATIONS:  None   CONDITION:  stable  PROCEDURE IN DETAIL: [id number]   Ryan Macdonald 10/23/2013, 10:44 AM  PATIENT ID:      Ryan Macdonald  MRN:     [id number] DOB/AGE:    1934/02/07 / 79 y.o.       OPERATIVE REPORT    DATE OF PROCEDURE:  10/23/2013       PREOPERATIVE DIAGNOSIS:   LEFT HIP OSTEOARTHRITIS-END STAGE                                                       BMI 44                                                        URETHRAL STRICTURE                                                                          POSTOPERATIVE DIAGNOSIS:   LEFT HIP OSTEOARTHRITIS-SAME  There is no weight on file to calculate BMI.     PROCEDURE:  Procedure(s): TOTAL HIP ARTHROPLASTY-LEFT CYSTOSCOPY WITH URETHRAL DILATATION (DR Patsi Sears)     SURGEON:  Norlene Campbell, MD    ASSISTANT:   Jacqualine Code, PA-C   (Present and scrubbed throughout the case, critical for assistance with exposure, retraction, instrumentation, and closure.)          ANESTHESIA: general     DRAINS: none :      TOURNIQUET TIME: * No tourniquets in log *    COMPLICATIONS:  None   CONDITION:  stable  PROCEDURE IN DETAIL: [id number]   Ryan Macdonald 10/23/2013, 10:44 AM

## 2013-10-23 NOTE — Transfer of Care (Signed)
Immediate Anesthesia Transfer of Care Note  Patient: Ryan Macdonald  Procedure(s) Performed: Procedure(s): TOTAL HIP ARTHROPLASTY (Left) CYSTOSCOPY WITH URETHRAL DILATATION (N/A)  Patient Location: PACU  Anesthesia Type:General  Level of Consciousness: awake and alert   Airway & Oxygen Therapy: Patient Spontanous Breathing and Patient connected to nasal cannula oxygen  Post-op Assessment: Report given to PACU RN, Post -op Vital signs reviewed and stable and Patient moving all extremities X 4  Post vital signs: Reviewed and stable  Complications: No apparent anesthesia complications

## 2013-10-23 NOTE — Op Note (Signed)
NAME:  BRITTEN, SEYFRIED              ACCOUNT NO.:  [id number]  MEDICAL RECORD NO.:  [id number]  LOCATION:  [facility]                         FACILITY:  [facility]  PHYSICIAN:  Claude Manges. Whitfield, M.D.DATE OF BIRTH:  03-Nov-1934  DATE OF PROCEDURE:  10/23/2013 DATE OF DISCHARGE:                              OPERATIVE REPORT   PREOPERATIVE DIAGNOSIS:  End-stage osteoarthritis, left hip.  POSTOPERATIVE DIAGNOSIS:  End-stage osteoarthritis, left hip with urethral stricture.  PROCEDURE: 1. Left total hip replacement. 2. Urethral dilatation and insertion of catheter (Dr. Patsi Sears, who     will dictate a separate note).  ANESTHESIA:  General.  COMPLICATIONS:  None.  COMPONENTS:  DePuy AML large stature 10.5 mm femoral component, 54 mm outer diameter Sector 3 Gription metallic acetabular component with a +4 10 degree Marathon polyethylene liner, a 36 mm outer diameter hip ball with a -2 neck length.  Components were press-fit.  DESCRIPTION OF PROCEDURE:  Mr. Gallery was met in the holding area, identified the left hip as the appropriate operative site and marked it accordingly.  He was then transported to room #7 and placed under general anesthesia.  Nursing staff attempted to insert a Foley catheter, but on several attempts, it was obvious that he had a significant urethral stricture.  Dr. Patsi Sears was then consulted and he performed a cystoscopy and insertion of catheter and he will follow him postoperatively.  The patient was then placed in a lateral decubitus position with the left side up and secured to the operating room table with the Innomed hip system.  The left hip was then prepped from the iliac crest below the knee with chlorhexidine prep and then DuraPrep sterile draping was performed.  Routine Southern incision was utilized and via sharp dissection, carried down to subcutaneous tissue.  The patient weighed 277 pounds with a BMI of 44 and there was abundant adipose tissue  which made the procedure technically difficult.  We used the Bovie to incise the adipose tissue to the level of the IT band.  This was then incised along the length of the skin incision.  Self-retaining retractors were inserted.  Abundant adipose tissue was then again encountered and then bluntly dissected from the short external rotators.  Tendinous structures were then tagged with 0 Ethibond suture.  Then they were incised from the posterior aspect of the greater trochanter with the Bovie.  Capsule was encountered and then carefully incised along the femoral neck and head. There was considerable overgrowth of the acetabulum with circumferential osteophyte formation that made dislocation of the hip posteriorly difficult.  I did a capsular strip and then removed some of the osteophytes posteriorly at which point, I was able to dislocate the hip. The hip was misshapen.  It was devoid of articular cartilage and lot of pitting.  Using the calcar guide, the head was osteotomized at the femoral neck and head junction.  Retractors were inserted.  The femoral canal was then prepared for the femoral component.  Starter hole was then made in the piriformis fossa. Hand reaming was then performed to 8 mm and then power reaming was performed to 10 mm to accept a 10.5 mm component.  The small stature 10.5  femoral component was then impacted on the calcar.  I used a calcar reamer to obtain the appropriate calcar angle.  The cut was approximately a cm proximal to the lesser trochanter.  Retractors were then placed above the acetabulum.  Any loose osteophytes were removed.  The labrum was also excised.  Reaming was then performed sequentially to 53 mm to accept a 54 component, but had excellent circumferential bleeding and a nice deep acetabulum.  I trialed a 52 and a 54 mm outer diameter components.  The 52 would completely seat with good rim fit, 54 could not completely seat.  Therefore, I  inserted the Gription Sector 3 acetabular metallic component 54 mm outer diameter, impacted in position using the external guide.  The trial polyethylene liner was then inserted followed by the femoral rasp.  We initially had tried a small stature and then felt that there was enough cancellous bone left in the calcar that the large stature would be more appropriate and this was carefully impacted into place.  We then trialed several head sizes and we could not reduce the head into the acetabulum.  Capsule appeared to be quite tight, so I stripped the capsule around the femur and still could not even insert the -2 hip ball.  The femoral components, trial components were then removed.  I osteotomized the neck about 3-4 mm, reinserted the hand ream and then carefully inserted the large stature femoral rasp.  I then cleaned out the acetabulum, rearranged the polyethylene liner so that the 10-degree posterior lip was placed more superiorly and then again using the -2 neck length with a 36 mm outer diameter hip ball, I was able to reduce the hip.  The +1 hip ball with would not reduce.  Through a full range of motion, I had perfect stability in flexion, extension, internal-external rotation.  There was no toggling.  The patient was above 1/2 to 3/4 of an inch short preoperatively and I felt that I had made up most of that leg length discrepancy.  The hip was then dislocated.  The trial components were removed.  The hip was copiously irrigated with saline solution.  The apex hole eliminator inserted followed by the Marathon polyethylene liner.  Wound was again irrigated with saline solution.  I checked the acetabulum.  It was nice and tight.  The femoral canal was irrigated with saline solution, then impacted the large stature femoral component, flush on the calcar.  Again cleaned the [facility] taper neck and inserted the 36 mm outer diameter hip ball with the -2 neck length, and we  carefully reduced the hip with some difficulty as the capsule was still felt to be tight, but at that point where the hip was reduced, there was no toggling.  He had excellent range of motion with no instability.  The wound was again irrigated with saline solution.  I did check the sciatic nerve on multiple occasions.  I did not visualize it, but I could palpate it deep in the soft tissue.  At that point, the deep capsule was closed with interrupted #1 Ethibond, superficial capsule closed the same material.  The wound was again irrigated.  The iliotibial band was closed with a running #1 Ethibond, Vicryl with subcu in several layers with 0 Vicryl and then #3 Monocryl.  Skin was closed with skin clips. Sterile bulky dressing was applied followed by Mepilex dressing.  The patient was then placed in the supine position and knee immobilizer applied to the left  lower extremity.  The patient tolerated the procedure well without any surgical complications and returned to the postanesthesia recovery room in satisfactory condition.     Claude Manges. Cleophas Dunker, M.D.     PWW/MEDQ  D:  10/23/2013  T:  10/23/2013  Job:  [id number]

## 2013-10-23 NOTE — Plan of Care (Signed)
Problem: Consults Goal: Diagnosis- Total Joint Replacement Primary Total Hip     

## 2013-10-23 NOTE — Op Note (Signed)
Pre-operative diagnosis :  Urethral Stricture Postoperative diagnosis: Same  Operation:  Flexible cystoscopy, Urethral dilation with Geraldo Pitter sounds, passage of 74F Councol Catheter  Surgeon:  S. Patsi Sears, MD  First assistant:  None  Anesthesia:  general  Preparation:  After appropriate pre-med, the patient was placed upon the OR table, where GET anesthesia was accomplished.       Review history:  Inability to pass foley.   Statement of  Likelihood of Success: Excellent. TIME-OUT observed.:  Procedure:Foley catheter was attempted to be passed, and, when this failed, Urologic consultation ws accomplished, Flexible cystoscope was passed into the distal urethra, and , under direct vision, the scope was passed in to the mid urethra, but resistance, and could not be passed into the proximal urethra, through a ong bulbar urethral stricture. Therefore, a 0.038 guidewire was passed through the stricture into the bladder, and,using the [facility] dilators, the stricture was dilated to a size 74F. After that, an 74F Council Catheter was passed with little difficulty, and 10cc water placed in the balloon

## 2013-10-23 NOTE — Anesthesia Preprocedure Evaluation (Addendum)
Anesthesia Evaluation  Patient identified by MRN, date of birth, ID band Patient awake    Reviewed: Allergy & Precautions, H&P , NPO status , Patient's Chart, lab work & pertinent test results, reviewed documented beta blocker date and time   Airway Mallampati: I TM Distance: >3 FB Neck ROM: Full    Dental  (+) Upper Dentures, Lower Dentures, Edentulous Upper and Edentulous Lower   Pulmonary former smoker,    Pulmonary exam normal       Cardiovascular hypertension, Pt. on medications and Pt. on home beta blockers +CHF  Echo on 11/01/11 showed LV function is severely reduced, EF 25%. RV systolic function is mildly reduced.  Trace AR. Mild to moderate MR. Trace PR. Mild TR.     Neuro/Psych    GI/Hepatic negative GI ROS,   Endo/Other  negative endocrine ROS  Renal/GU Renal Insufficiencynegative Renal ROS     Musculoskeletal   Abdominal   Peds  Hematology   Anesthesia Other Findings   Reproductive/Obstetrics                       Anesthesia Physical Anesthesia Plan  ASA: III  Anesthesia Plan: General   Post-op Pain Management:    Induction: Intravenous  Airway Management Planned: Oral ETT  Additional Equipment:   Intra-op Plan:   Post-operative Plan: Extubation in OR  Informed Consent: I have reviewed the patients History and Physical, chart, labs and discussed the procedure including the risks, benefits and alternatives for the proposed anesthesia with the patient or authorized representative who has indicated his/her understanding and acceptance.   Dental advisory given  Plan Discussed with: CRNA, Anesthesiologist and Surgeon  Anesthesia Plan Comments:         Anesthesia Quick Evaluation

## 2013-10-24 ENCOUNTER — Encounter (HOSPITAL_COMMUNITY): Payer: Self-pay | Admitting: Orthopaedic Surgery

## 2013-10-24 DIAGNOSIS — I428 Other cardiomyopathies: Secondary | ICD-10-CM | POA: Diagnosis present

## 2013-10-24 DIAGNOSIS — E669 Obesity, unspecified: Secondary | ICD-10-CM | POA: Insufficient documentation

## 2013-10-24 DIAGNOSIS — Z96649 Presence of unspecified artificial hip joint: Secondary | ICD-10-CM

## 2013-10-24 DIAGNOSIS — E785 Hyperlipidemia, unspecified: Secondary | ICD-10-CM | POA: Insufficient documentation

## 2013-10-24 DIAGNOSIS — I10 Essential (primary) hypertension: Secondary | ICD-10-CM | POA: Insufficient documentation

## 2013-10-24 LAB — BASIC METABOLIC PANEL
BUN: 16 mg/dL (ref 6–23)
Calcium: 8.3 mg/dL — ABNORMAL LOW (ref 8.4–10.5)
Creatinine, Ser: 1.27 mg/dL (ref 0.50–1.35)
GFR calc Af Amer: 61 mL/min — ABNORMAL LOW (ref >90)
GFR calc non Af Amer: 53 mL/min — ABNORMAL LOW (ref >90)
Glucose, Bld: 138 mg/dL — ABNORMAL HIGH (ref 70–99)
Potassium: 3.8 mEq/L (ref 3.5–5.1)
Sodium: 139 mEq/L (ref 135–145)

## 2013-10-24 LAB — CBC
HCT: 29.1 % — ABNORMAL LOW (ref 39.0–52.0)
Hemoglobin: 9.7 g/dL — ABNORMAL LOW (ref 13.0–17.0)
MCH: 32 pg (ref 26.0–34.0)
MCHC: 33.3 g/dL (ref 30.0–36.0)
MCV: 96 fL (ref 78.0–100.0)
RDW: 13.8 % (ref 11.5–15.5)

## 2013-10-24 MED ORDER — RIVAROXABAN 20 MG PO TABS
20.0000 mg | ORAL_TABLET | Freq: Every day | ORAL | Status: DC
  Administered 2013-10-24 – 2013-10-25 (×2): 20 mg via ORAL
  Filled 2013-10-24 (×3): qty 1

## 2013-10-24 MED ORDER — ISOSORBIDE DINITRATE ER 40 MG PO CPCR
40.0000 mg | ORAL_CAPSULE | Freq: Three times a day (TID) | ORAL | Status: DC
  Administered 2013-10-24 – 2013-10-26 (×7): 40 mg via ORAL
  Filled 2013-10-24 (×9): qty 1

## 2013-10-24 NOTE — Progress Notes (Signed)
Pt was placed on tele monitor this evening per MD order. Pt has been exhibiting a fib, irregular heart rhythm and occasional multifocal PVCs. Pt is asymptomatic with no complaints of chest pain. HR steady in the high 70s- low 80s. On call physician notified. No orders received. Will continue to monitor.

## 2013-10-24 NOTE — Progress Notes (Signed)
ANTICOAGULATION CONSULT NOTE - Initial Consult  Pharmacy Consult for Xarelto Indication: atrial fibrillation and VTE prophylaxis  Allergies  Allergen Reactions  . Lisinopril Swelling    Patient Measurements: Height: 5' 6.93" (170 cm) Weight: 261 lb 14.5 oz (118.8 kg) IBW/kg (Calculated) : 65.94  Vital Signs: Temp: 98.7 F (37.1 C) (11/26 0459) Temp src: Oral (11/26 0459) BP: 125/65 mmHg (11/26 0459) Pulse Rate: 88 (11/26 0459)  Labs:  Recent Labs  10/24/13 0515  HGB 9.7*  HCT 29.1*  PLT 188  CREATININE 1.27    Estimated Creatinine Clearance: 60 ml/min (by C-G formula based on Cr of 1.27).   Medical History: Past Medical History  Diagnosis Date  . Nonischemic cardiomyopathy     EF 25% by echo in 2012, catheterization 5 years ago in [place] reportedly showed no significant coronary disease  . Hypertension   . Cat scratch fever     removed mass on right side neck  . Cataracts, bilateral   . High cholesterol   . Osteoarthritis   . Kidney stones     Medications:  Xarelto 10 mg PO daily (started 11/25 PM)  Assessment: 79 yo M s/p L THA and was initially started on Xarelto 10mg  for post-op VTE prophylaxis.  Pt was found to have PAF post-op and cardiology would like to continue Xarelto for that indication as well.    Goal of Therapy:  Therapeutic anticoagulation Monitor platelets by anticoagulation protocol: Yes   Plan:  Increase Xarelto to 20mg  daily with dinner  Xarelto education  Follow up any signs or symptoms of bleeding and CBC trend  [employer], Pharm.D., BCPS Clinical Pharmacist Pager [phone] 10/24/2013 2:35 PM

## 2013-10-24 NOTE — Care Management Utilization Note (Signed)
Utilization review completed. Sareena Odeh, RN BSN 

## 2013-10-24 NOTE — Consult Note (Signed)
Cardiology Consult Note  Admit date: 10/23/2013 Name: Ryan Macdonald 79 y.o.  male DOB:  03/30/1934 MRN:  [id number]  Today's date:  10/24/2013  Referring Physician:    Dr. Norlene Campbell  Primary Physician:    [facility]  Reason for Consultation:    Postoperative cardiac evaluation, possible atrial fibrillation  IMPRESSIONS: 1. Paroxysmal atrial fibrillation following hip replacement 2. Nonischemic cardiomyopathy by history 3. Hypertension 4. Obesity  RECOMMENDATION: 1. Obtain echocardiogram to assess left atrial size and LV function. According to the records the last echocardiogram was in 2012 2. Continue beta blockers for rate control and on telemetry 3. Anticoagulation either with warfarin or XARELTO in light of recent hip surgery and atrial fibrillation 4. Early followup with the [facility] cardiologist for atrial fibrillation and anticoagulation.  HISTORY: This 79 year old black male had a hip replacement electively. He has a history of a nonischemic cardiomyopathy and is followed at the [facility]. He evidently had a catheterization in [place] around 5 years ago was told he did not have significant coronary disease. He was felt to be moderate risk for surgery but was in sinus rhythm on the preoperative EKG. Following surgery he has done well with this but is had episodes of what appears to be paroxysmal atrial fibrillation on monitoring. He has been asymptomatic from this. He denies angina and has no PND, orthopnea or significant edema. He evidently had an echocardiogram in 2012 showing an ejection fraction of 25-30%. He has evidently refused to have an AICD put in in the past.  Past Medical History  Diagnosis Date  . Nonischemic cardiomyopathy     EF 25% by echo in 2012, catheterization 5 years ago in [place] reportedly showed no significant coronary disease  . Hypertension   . Cat scratch fever     removed mass on right side neck  . Cataracts, bilateral   .  High cholesterol   . Osteoarthritis   . Kidney stones       Past Surgical History  Procedure Laterality Date  . Kidney stone surgery Right 1970's; 1983    "twice"  . Tonsillectomy and adenoidectomy  1940's  . Cystoscopy with urethral dilatation  10/23/2013  . Total hip arthroplasty Left 10/23/2013  . Inguinal hernia repair Right 1950's  . Circumcision  1940's  . Incision and drainage of wound Right 1980's    "cat scratch" (10/23/2013)  . Cardiac catheterization  1980's  . Total hip arthroplasty Left 10/23/2013    Procedure: TOTAL HIP ARTHROPLASTY;  Surgeon: Valeria Batman, MD;  Location: [facility] OR;  Service: Orthopedics;  Laterality: Left;  . Cystoscopy with urethral dilatation N/A 10/23/2013    Procedure: CYSTOSCOPY WITH URETHRAL DILATATION;  Surgeon: Kathi Ludwig, MD;  Location: [facility] OR;  Service: Urology;  Laterality: N/A;     Allergies:  is allergic to lisinopril.   Medications: Prior to Admission medications   Medication Sig Start Date End Date Taking? Authorizing Provider  aspirin EC 81 MG tablet Take 81 mg by mouth daily.   Yes Historical Provider, MD  carvedilol (COREG) 25 MG tablet Take 25 mg by mouth 2 (two) times daily with a meal.   Yes Historical Provider, MD  ciprofloxacin (CIPRO) 500 MG tablet Take 500 mg by mouth 2 (two) times daily.   Yes Historical Provider, MD  cyanocobalamin 1000 MCG tablet Take 2,000 mcg by mouth daily.   Yes Historical Provider, MD  furosemide (LASIX) 40 MG tablet Take 40 mg by mouth 2 (two) times daily.  Yes Historical Provider, MD  hydrALAZINE (APRESOLINE) 10 MG tablet Take 20 mg by mouth 2 (two) times daily.   Yes Historical Provider, MD  isosorbide dinitrate (ISOCHRON) 40 MG CR tablet Take 40 mg by mouth 3 (three) times daily.   Yes Historical Provider, MD  Multiple Vitamin (MULTIVITAMIN WITH MINERALS) TABS tablet Take 1 tablet by mouth daily.   Yes Historical Provider, MD  niacin (NIASPAN) 500 MG CR tablet Take 500 mg by mouth at  bedtime.   Yes Historical Provider, MD  simvastatin (ZOCOR) 80 MG tablet Take 40 mg by mouth every evening.   Yes Historical Provider, MD  spironolactone (ALDACTONE) 25 MG tablet Take 25 mg by mouth at bedtime.   Yes Historical Provider, MD  tamsulosin (FLOMAX) 0.4 MG CAPS capsule Take 0.4 mg by mouth at bedtime.   Yes Historical Provider, MD  traMADol (ULTRAM) 50 MG tablet Take 50 mg by mouth 4 (four) times daily as needed for moderate pain.   Yes Historical Provider, MD    Family History: Family Status  Relation Status Death Age  . Father Deceased 17's    pulmonary comps  . Mother Deceased 54    cerebral hemmorhage  . Brother Deceased 3 months    SIDS  . Sister Deceased 76    lupus  . Sister Deceased 61's    comps of diabetes    Social History:   reports that he has quit smoking. His smoking use included Cigarettes and Cigars. He has a 15 pack-year smoking history. He has never used smokeless tobacco. He reports that he drinks about 1.2 ounces of alcohol per week. He reports that he does not use illicit drugs.   History   Social History Narrative  . No narrative on file    Review of Systems: He has had a prior history of urethral stricture. He has been obese for many years. No significant history of angina. He does complain of nocturia as well as some urinary hesitancy at times. Significant hip pain that limits his activity. Other than as noted above the remainder of the review of systems is unremarkable.  Physical Exam: BP 125/65  Pulse 88  Temp(Src) 98.7 F (37.1 C) (Oral)  Resp 18  Ht 5' 6.93" (1.7 m)  Wt 118.8 kg (261 lb 14.5 oz)  BMI 41.11 kg/m2  SpO2 95%  General appearance: Pleasant friendly black male in no acute distress Eyes: conjunctivae/corneas clear. PERRL, EOM's intact. Fundi benign. Neck: no adenopathy, no carotid bruit, no JVD and supple, symmetrical, trachea midline Lungs: clear to auscultation bilaterally Heart: regular rate and rhythm, S1, S2  normal, no murmur, click, rub or gallop Abdomen: Obese, soft nontender Rectal: deferred Extremities: No edema noted Pulses: 2+ and symmetric Neurologic: Grossly normal  Labs: CBC  Recent Labs  10/24/13 0515  WBC 7.8  RBC 3.03*  HGB 9.7*  HCT 29.1*  PLT 188  MCV 96.0  MCH 32.0  MCHC 33.3  RDW 13.8   CMP   Recent Labs  10/24/13 0515  NA 139  K 3.8  CL 103  CO2 26  GLUCOSE 138*  BUN 16  CREATININE 1.27  CALCIUM 8.3*  GFRNONAA 53*  GFRAA 61*    Radiology: No active disease, unfolded aorta  EKG: None from this admission. Previous EKG shows normal sinus rhythm with first degree AV block and nonspecific ST changes  Signed:  W. Ashley Royalty MD [facility]   Cardiology Consultant  10/24/2013, 1:51 PM

## 2013-10-24 NOTE — Progress Notes (Signed)
Patient ID: Ryan Macdonald, male   DOB: [birth year], 79 y.o.   MRN: [id number] PATIENT ID: Ryan Macdonald        MRN:  [id number]          DOB/AGE: May 22, 1934 / 79 y.o.    Norlene Campbell, MD   Jacqualine Code, PA-C [address] [place], [place]  [address]                             [phone]   PROGRESS NOTE  Subjective:  negative for Chest Pain  negative for Shortness of Breath  negative for Nausea/Vomiting   negative for Calf Pain    Tolerating Diet: yes         Patient reports pain as mild.       Objective: Vital signs in last 24 hours:   Patient Vitals for the past 24 hrs:  BP Temp Temp src Pulse Resp SpO2 Height Weight  10/24/13 0459 125/65 mmHg 98.7 F (37.1 C) Oral 88 16 95 % - -  10/24/13 0133 121/66 mmHg 98.7 F (37.1 C) Oral 85 16 96 % - -  10/23/13 2144 131/72 mmHg - - - - - - -  10/23/13 2043 131/66 mmHg 98.4 F (36.9 C) Oral 78 16 98 % - -  10/23/13 1500 - - - - - - 5' 6.93" (1.7 m) 118.8 kg (261 lb 14.5 oz)  10/23/13 1325 125/65 mmHg 97.8 F (36.6 C) - 63 16 96 % - -  10/23/13 1247 - - - 62 20 100 % - -  10/23/13 1243 149/71 mmHg 97.7 F (36.5 C) - 59 15 99 % - -  10/23/13 1234 149/71 mmHg - - 69 21 100 % - -  10/23/13 1230 150/77 mmHg - - 66 17 99 % - -  10/23/13 1219 150/77 mmHg - - 73 17 100 % - -  10/23/13 1215 - - - 64 16 100 % - -  10/23/13 1204 151/86 mmHg - - 73 21 100 % - -  10/23/13 1200 - - - 73 20 100 % - -  10/23/13 1150 161/93 mmHg - - 68 14 100 % - -  10/23/13 1145 - - - 73 15 100 % - -  10/23/13 1134 149/84 mmHg - - 82 18 100 % - -  10/23/13 1130 - - - 83 23 99 % - -  10/23/13 1119 141/75 mmHg 97.7 F (36.5 C) - 96 16 97 % - -      Intake/Output from previous day:   11/25 0701 - 11/26 0700 In: 1877.5 [P.O.:360; I.V.:1142.5] Out: 1275 [Urine:875]   Intake/Output this shift:       Intake/Output     11/25 0701 - 11/26 0700 11/26 0701 - 11/27 0700   P.O. 360    I.V. (mL/kg) 1142.5 (9.6)    Other 75    IV Piggyback 300    Total Intake(mL/kg) 1877.5 (15.8)    Urine (mL/kg/hr) 875 (0.3)    Blood 400 (0.1)    Total Output 1275     Net +602.5             LABORATORY DATA:  Recent Labs  10/17/13 1403 10/24/13 0515  WBC 8.2 7.8  HGB 12.9* 9.7*  HCT 37.7* 29.1*  PLT 264 188    Recent Labs  10/17/13 1403 10/24/13 0515  NA 141 139  K 4.1 3.8  CL 100 103  CO2 27  26  BUN 18 16  CREATININE 1.25 1.27  GLUCOSE 145* 138*  CALCIUM 9.7 8.3*   Lab Results  Component Value Date   INR 0.99 10/17/2013    Recent Radiographic Studies :  Chest 2 View  10/17/2013   CLINICAL DATA:  Preoperative Versed prior to exam for hip surgery.  EXAM: CHEST  2 VIEW  COMPARISON:  None.  FINDINGS: Heart size is normal. The aorta is unfolded. The pulmonary vascularity is normal. The lungs are clear. No effusions. Chronic degenerative changes affect the spine.  IMPRESSION: No active disease.  Unfolded aorta .   Electronically Signed   By: Paulina Fusi M.D.   On: 10/17/2013 15:34   Dg Pelvis Portable  10/23/2013   CLINICAL DATA:  Status post left hip arthroplasty.  EXAM: PORTABLE PELVIS 1-2 VIEWS  COMPARISON:  None.  FINDINGS: Total left hip arthroplasty is identified. Hardware is grossly intact. The native osseous structures. Grossly unremarkable. Skin staples identified about the soft tissues. Foley catheter is appreciated. Degenerative changes identified within the lower lumbar spine as well as within the sacroiliac regions. Degenerative changes identified involving the right hip.  IMPRESSION: Patient is status post total left hip arthroplasty.   Electronically Signed   By: Salome Holmes M.D.   On: 10/23/2013 12:27   Dg Hip Portable 1 View Left  10/23/2013   CLINICAL DATA:  Left hip arthroplasty  EXAM: PORTABLE LEFT HIP - 1 VIEW  COMPARISON:  None.  FINDINGS: New left hip prosthesis is well-seated and aligned. There is no acute fracture or evidence of an operative complication.  IMPRESSION: Well-positioned left hip prosthesis.    Electronically Signed   By: Amie Portland M.D.   On: 10/23/2013 13:30     Examination:  General appearance: alert, cooperative and no distress  Wound Exam: clean, dry, intact   Drainage:  None: wound tissue dry  Motor Exam: EHL, FHL, Anterior Tibial and Posterior Tibial Intact  Sensory Exam: Superficial Peroneal, Deep Peroneal and Tibial normal  Vascular Exam: Normal  Assessment:    1 Day Post-Op  Procedure(s) (LRB): TOTAL HIP ARTHROPLASTY (Left) CYSTOSCOPY WITH URETHRAL DILATATION (N/A)  ADDITIONAL DIAGNOSIS:  Active Problems:   * No active hospital problems. *  Acute Blood Loss Anemia-asymptomatic Urethral stricture-per Dr Patsi Sears  Plan: Physical Therapy as ordered Weight Bearing as Tolerated (WBAT)  DVT Prophylaxis:  Xarelto  DISCHARGE PLAN: Home  DISCHARGE NEEDS: HHPT, Walker and 3-in-1 comode seat Good night,comfortable this am, OOB with PT,being followed by Dr Patsi Sears for foley care and cardiology service with hx of CHF        Valeria Batman 10/24/2013 7:57 AM

## 2013-10-24 NOTE — Progress Notes (Signed)
  Echocardiogram 2D Echocardiogram has been performed.  Cathie Beams 10/24/2013, 3:42 PM

## 2013-10-24 NOTE — Evaluation (Signed)
Physical Therapy Evaluation Patient Details Name: Ryan Macdonald MRN: [id number] DOB: 1934-08-10 Today's Date: 10/24/2013 Time: 4098-1191 PT Time Calculation (min): 39 min  PT Assessment / Plan / Recommendation History of Present Illness  Left total hip replacement and Urethral dilatation and insertion of catheter   Clinical Impression  This patient presents with acute pain and decreased functional independence following the above mentioned procedure. At the time of PT eval, pt required assist with LLE movement to EOB, and for trunk assist to come to full sit. Frequent VC's to maintain hip precautions especially during turns. This patient is appropriate for skilled PT interventions to address functional limitations, improve safety and independence with functional mobility, and return to PLOF.     PT Assessment  Patient needs continued PT services    Follow Up Recommendations  Home health PT    Does the patient have the potential to tolerate intense rehabilitation      Barriers to Discharge        Equipment Recommendations  None recommended by PT    Recommendations for Other Services     Frequency 7X/week    Precautions / Restrictions Precautions Precautions: Posterior Hip;Fall Precaution Comments: Reviewed posterior hip precautions with pt, but handout is needed for next session Restrictions Weight Bearing Restrictions: Yes LLE Weight Bearing: Weight bearing as tolerated Other Position/Activity Restrictions: WBAT   Pertinent Vitals/Pain Pt reports minimal pain at end of session.      Mobility  Bed Mobility Bed Mobility: Supine to Sit;Sitting - Scoot to Edge of Bed Supine to Sit: 3: Mod assist Supine to Sit: Patient Percentage: 60% Sitting - Scoot to Edge of Bed: 4: Min assist;With rail Details for Bed Mobility Assistance: Assist for LE support and movement. Trunk assist to come to full sit initially Transfers Transfers: Sit to Stand;Stand to Sit Sit to Stand: 4:  Min assist Sit to Stand: Patient Percentage: 80% Stand to Sit: 4: Min assist Details for Transfer Assistance: VC's for safe hand placement and extending LLE out to maintain hip precautions Ambulation/Gait Ambulation/Gait Assistance: 4: Min guard Ambulation Distance (Feet): 60 Feet Assistive device: Rolling walker Ambulation/Gait Assistance Details: VC's for sequencing and safety awareness with the RW. Cueing to maintain hip precautions during turns Gait Pattern: Step-to pattern;Decreased stride length Gait velocity: Decreased    Exercises     PT Diagnosis: Difficulty walking;Acute pain  PT Problem List: Decreased strength;Decreased range of motion;Decreased activity tolerance;Decreased balance;Decreased mobility;Decreased knowledge of use of DME;Decreased safety awareness;Decreased knowledge of precautions;Pain PT Treatment Interventions: DME instruction;Gait training;Stair training;Functional mobility training;Therapeutic activities;Therapeutic exercise;Neuromuscular re-education;Patient/family education     PT Goals(Current goals can be found in the care plan section) Acute Rehab PT Goals Patient Stated Goal: To return home with wife PT Goal Formulation: With patient Time For Goal Achievement: 10/31/13 Potential to Achieve Goals: Good  Visit Information  Last PT Received On: 10/24/13 Assistance Needed: +1 History of Present Illness: Left total hip replacement and Urethral dilatation and insertion of catheter        Prior Functioning  Home Living Family/patient expects to be discharged to:: Private residence Living Arrangements: Spouse/significant other Available Help at Discharge: Family;Available 24 hours/day Type of Home: Apartment Home Access: Stairs to enter Entrance Stairs-Number of Steps: 4 Entrance Stairs-Rails: Right;Left Home Layout: One level Home Equipment: Walker - 2 wheels;Bedside commode;Grab bars - tub/shower Prior Function Level of Independence:  Independent Communication Communication: No difficulties Dominant Hand: Right    Cognition  Cognition Arousal/Alertness: Awake/alert Behavior During Therapy: WFL for tasks  assessed/performed Overall Cognitive Status: Within Functional Limits for tasks assessed    Extremity/Trunk Assessment Upper Extremity Assessment Upper Extremity Assessment: Defer to OT evaluation Lower Extremity Assessment Lower Extremity Assessment: LLE deficits/detail LLE Deficits / Details: Decreased strength and AROM consistent with L THA LLE: Unable to fully assess due to pain Cervical / Trunk Assessment Cervical / Trunk Assessment: Normal   Balance    End of Session PT - End of Session Equipment Utilized During Treatment: Gait belt Activity Tolerance: Patient tolerated treatment well Patient left: in chair;with call bell/phone within reach Nurse Communication: Mobility status  GP     Ruthann Cancer 10/24/2013, 12:06 PM  Ruthann Cancer, PT, DPT [phone]

## 2013-10-24 NOTE — Progress Notes (Signed)
Physical Therapy Treatment Patient Details Name: Ryan Macdonald MRN: [id number] DOB: 21-Apr-1934 Today's Date: 10/24/2013 Time: 1334-1400 PT Time Calculation (min): 26 min  PT Assessment / Plan / Recommendation  History of Present Illness Left total hip replacement and Urethral dilatation and insertion of catheter    PT Comments   Pt making steady progress with mobility.  Increased ambulation distance & began stair training this session.  Cont with current POC to maximize functional mobility prior to d/c home.     Follow Up Recommendations  Home health PT     Does the patient have the potential to tolerate intense rehabilitation     Barriers to Discharge        Equipment Recommendations  None recommended by PT    Recommendations for Other Services    Frequency 7X/week   Progress towards PT Goals Progress towards PT goals: Progressing toward goals  Plan Current plan remains appropriate    Precautions / Restrictions Precautions Precautions: Posterior Hip;Fall Precaution Comments: reviewed hip precautions Restrictions Weight Bearing Restrictions: Yes LLE Weight Bearing: Weight bearing as tolerated Other Position/Activity Restrictions: WBAT   Pertinent Vitals/Pain States Lt hip sore/stiff     Mobility   Transfers Transfers: Sit to Stand;Stand to Sit Sit to Stand: With upper extremity assist;With armrests;From chair/3-in-1;3: Mod assist Sit to Stand: Patient Percentage: 80% Stand to Sit: 4: Min guard;With upper extremity assist;With armrests;To chair/3-in-1 Details for Transfer Assistance: cues for hand placement & LLE positioning.  (A) to shift weight anteriorly with sit>stand from recliner Ambulation/Gait Ambulation/Gait Assistance: 4: Min guard Ambulation Distance (Feet): 100 Feet Assistive device: Rolling walker Ambulation/Gait Assistance Details: cues for sequencing & RW advancement Gait Pattern: Step-to pattern;Decreased hip/knee flexion - left;Decreased weight  shift to left;Decreased step length - right Gait velocity: Decreased Stairs: Yes Stairs Assistance: 4: Min guard Stairs Assistance Details (indicate cue type and reason): cues for sequencing & technique.  Practiced with 2 rails vs 1 rail to simulate getting into apartment.   Stair Management Technique: One rail Right;Two rails;Step to pattern;Forwards Number of Stairs: 5 (3 + 2) Wheelchair Mobility Wheelchair Mobility: No      PT Goals (current goals can now be found in the care plan section) Acute Rehab PT Goals Patient Stated Goal: To return home with wife PT Goal Formulation: With patient Time For Goal Achievement: 10/31/13 Potential to Achieve Goals: Good  Visit Information  Last PT Received On: 10/24/13 Assistance Needed: +1 History of Present Illness: Left total hip replacement and Urethral dilatation and insertion of catheter     Subjective Data  Patient Stated Goal: To return home with wife   Cognition  Cognition Arousal/Alertness: Awake/alert Behavior During Therapy: WFL for tasks assessed/performed Overall Cognitive Status: Within Functional Limits for tasks assessed    Balance     End of Session PT - End of Session Equipment Utilized During Treatment: Gait belt Activity Tolerance: Patient tolerated treatment well Patient left: in chair;with call bell/phone within reach;with family/visitor present Nurse Communication: Mobility status   GP     Lara Mulch 10/24/2013, 2:05 PM  Verdell Face, PTA [phone] 10/24/2013

## 2013-10-24 NOTE — Evaluation (Signed)
Occupational Therapy Evaluation Patient Details Name: Ryan Macdonald MRN: [id number] DOB: 1934/10/18 Today's Date: 10/24/2013 Time: 8295-6213 OT Time Calculation (min): 40 min  OT Assessment / Plan / Recommendation History of present illness Left total hip replacement and Urethral dilatation and insertion of catheter    Clinical Impression   [name] admitted with above presents to acute OT with deficits below. Will benefit from one more session of OT to address LBADLs.    OT Assessment  Patient needs continued OT Services    Follow Up Recommendations  No OT follow up       Equipment Recommendations  3 in 1 bedside comode (if he finds out they do not already have one)       Frequency  Min 2X/week    Precautions / Restrictions Precautions Precautions: Posterior Hip;Fall Restrictions Weight Bearing Restrictions: No Other Position/Activity Restrictions: WBAT       ADL  Eating/Feeding: Independent Where Assessed - Eating/Feeding: Chair Grooming: Set up Where Assessed - Grooming: Supported sitting Upper Body Bathing: Set up Where Assessed - Upper Body Bathing: Supported sitting Lower Body Bathing: +1 Total assistance Where Assessed - Lower Body Bathing: Supported sit to stand Upper Body Dressing: Set up Where Assessed - Upper Body Dressing: Supported sitting Lower Body Dressing: +1 Total assistance Where Assessed - Lower Body Dressing: Supported sit to Pharmacist, hospital: Minimal Dentist Method: Sit to Barista: Bedside commode (over toilet) Toileting - Clothing Manipulation and Hygiene: Min guard Where Assessed - Glass blower/designer Manipulation and Hygiene: Standing Equipment Used: Gait belt;Rolling walker Transfers/Ambulation Related to ADLs: total A +2 (80%) for first sit>stand from bed (raised); min A stand>sit recliner    OT Diagnosis: Generalized weakness  OT Problem List: Impaired balance (sitting and/or  standing);Decreased range of motion;Obesity;Decreased knowledge of precautions;Decreased knowledge of use of DME or AE OT Treatment Interventions: Self-care/ADL training;Balance training;Patient/family education;DME and/or AE instruction;Therapeutic activities   OT Goals(Current goals can be found in the care plan section) Acute Rehab OT Goals OT Goal Formulation: With patient Time For Goal Achievement: 10/31/13 Potential to Achieve Goals: Good  Visit Information  Last OT Received On: 10/24/13 Assistance Needed: +2 PT/OT Co-Evaluation/Treatment: Yes History of Present Illness: Left total hip replacement and Urethral dilatation and insertion of catheter        Prior Functioning     Home Living Family/patient expects to be discharged to:: Private residence Living Arrangements: Spouse/significant other Available Help at Discharge: Family;Available 24 hours/day Type of Home: Apartment Home Access: Stairs to enter Entrance Stairs-Number of Steps: 4 Entrance Stairs-Rails: Right;Left Home Layout: One level Home Equipment: Walker - 2 wheels;Bedside commode;Grab bars - tub/shower (Reacher and sock aid, tub shower) Prior Function Level of Independence: Independent Communication Communication: No difficulties Dominant Hand: Right         Vision/Perception Vision - History Patient Visual Report: No change from baseline   Cognition  Cognition Arousal/Alertness: Awake/alert Behavior During Therapy: WFL for tasks assessed/performed Overall Cognitive Status: Within Functional Limits for tasks assessed    Extremity/Trunk Assessment Upper Extremity Assessment Upper Extremity Assessment: Overall WFL for tasks assessed Lower Extremity Assessment Lower Extremity Assessment: Defer to PT evaluation     Mobility Bed Mobility Bed Mobility: Supine to Sit;Sitting - Scoot to Edge of Bed Supine to Sit: 1: +2 Total assist;With rails;HOB elevated Supine to Sit: Patient Percentage:  60% Sitting - Scoot to Edge of Bed: 4: Min assist;With rail (LE only) Details for Bed Mobility Assistance: VCs for  sequencing Transfers Transfers: Sit to Stand;Stand to Sit Sit to Stand: 1: +2 Total assist;From elevated surface;With upper extremity assist;From bed Sit to Stand: Patient Percentage: 80% Stand to Sit: 4: Min assist;With upper extremity assist;With armrests;To chair/3-in-1 Details for Transfer Assistance: VCs for safe hand placement           End of Session OT - End of Session Equipment Utilized During Treatment: Gait belt;Rolling walker Activity Tolerance: Patient tolerated treatment well Patient left: in chair;with call bell/phone within reach Nurse Communication: Mobility status       Ryan Macdonald [phone] 10/24/2013, 11:53 AM

## 2013-10-24 NOTE — Progress Notes (Signed)
10/24/13 Set up with HHPT with Gordy Clement by MD office.Spoke with patient no chane in discharge plan. Spoke with patient about DME, he goes to Dr. Randa Ngo at the [facility].Contacted Dr. Arlyss Gandy office to get approval for DME. Spoke with Ms Cliffton Asters who instructed me to fax the orders for rolling walker and 3N1 to [phone]. They will have the equipment sent to the patient's home. Faxed orders and received confirmation. Will continue to  follow for d/c needs. Jacquelynn Cree RN, BSN, CCM

## 2013-10-24 NOTE — Progress Notes (Signed)
1 Day Post-Op Subjective: Patient reports hx of urethral stricture in the distant past, in [place]. He was employed as International aid/development worker at [employer] and has had 2 kidney stones in the remote past with instrumentation ( ? Source of stricture). He has remained on flomax. He is followed by [facility]. Now post cystoscopy and dilation of long, bulbar urethral stricture, and council catheter placement yesterday.   Objective: Vital signs in last 24 hours: Temp:  [97.7 F (36.5 C)-98.7 F (37.1 C)] 98.7 F (37.1 C) (11/26 0459) Pulse Rate:  [59-96] 88 (11/26 0459) Resp:  [14-23] 16 (11/26 0459) BP: (121-161)/(65-93) 125/65 mmHg (11/26 0459) SpO2:  [95 %-100 %] 95 % (11/26 0459) Weight:  [118.8 kg (261 lb 14.5 oz)] 118.8 kg (261 lb 14.5 oz) (11/25 1500)  Intake/Output from previous day: 11/25 0701 - 11/26 0700 In: 1877.5 [P.O.:360; I.V.:1142.5; IV Piggyback:300] Out: 1275 [Urine:875; Blood:400] Intake/Output this shift:    Physical Exam:  General:alert and cooperative GI: not done and soft, non tender, normal bowel sounds, no palpable masses, no organomegaly, no inguinal hernia Male genitalia: not done no penile lesions or discharge    Lab Results:  Recent Labs  10/24/13 0515  HGB 9.7*  HCT 29.1*   BMET  Recent Labs  10/24/13 0515  NA 139  K 3.8  CL 103  CO2 26  GLUCOSE 138*  BUN 16  CREATININE 1.27  CALCIUM 8.3*   No results found for this basename: LABPT, INR,  in the last 72 hours No results found for this basename: LABURIN,  in the last 72 hours Results for orders placed during the hospital encounter of 10/17/13  URINE CULTURE     Status: None   Collection Time    10/17/13  2:02 PM      Result Value Range Status   Specimen Description URINE, CLEAN CATCH   Final   Special Requests NONE   Final   Culture  Setup Time     Final   Value: 10/17/2013 16:18     Performed at [facility] Count     Final   Value: NO GROWTH     Performed at  [facility]   Culture     Final   Value: NO GROWTH     Performed at [facility]   Report Status 10/18/2013 FINAL   Final  SURGICAL PCR SCREEN     Status: None   Collection Time    10/17/13  2:13 PM      Result Value Range Status   MRSA, PCR NEGATIVE  NEGATIVE Final   Staphylococcus aureus NEGATIVE  NEGATIVE Final   Comment:            The Xpert SA Assay (FDA     approved for NASAL specimens     in patients over 73 years of age),     is one component of     a comprehensive surveillance     program.  Test performance has     been validated by [employer] for patients greater     than or equal to 50 year old.     It is not intended     to diagnose infection nor to     guide or monitor treatment.    Studies/Results: Dg Pelvis Portable  10/23/2013   CLINICAL DATA:  Status post left hip arthroplasty.  EXAM: PORTABLE PELVIS 1-2 VIEWS  COMPARISON:  None.  FINDINGS: Total left hip arthroplasty is identified. Hardware is grossly intact. The native osseous structures. Grossly unremarkable. Skin staples identified about the soft tissues. Foley catheter is appreciated. Degenerative changes identified within the lower lumbar spine as well as within the sacroiliac regions. Degenerative changes identified involving the right hip.  IMPRESSION: Patient is status post total left hip arthroplasty.   Electronically Signed   By: Salome Holmes M.D.   On: 10/23/2013 12:27   Dg Hip Portable 1 View Left  10/23/2013   CLINICAL DATA:  Left hip arthroplasty  EXAM: PORTABLE LEFT HIP - 1 VIEW  COMPARISON:  None.  FINDINGS: New left hip prosthesis is well-seated and aligned. There is no acute fracture or evidence of an operative complication.  IMPRESSION: Well-positioned left hip prosthesis.   Electronically Signed   By: Amie Portland M.D.   On: 10/23/2013 13:30    Assessment/Plan: Continue foley due to urethral dilation.  Plan: Leave foley catheter today, while patient begins to  mobilize. Will plan to d/c foley in Am, with plans for possible d/c. Anticipte that he will be able to void with improved stream.  Pt notes he is followed at the [facility], for all but his orthopedics, but may be followed in my office if he would prefer. [phone]).    LOS: 1 day   Jasiah Elsen I 10/24/2013, 8:24 AM

## 2013-10-25 DIAGNOSIS — I4891 Unspecified atrial fibrillation: Secondary | ICD-10-CM | POA: Diagnosis not present

## 2013-10-25 DIAGNOSIS — I10 Essential (primary) hypertension: Secondary | ICD-10-CM

## 2013-10-25 DIAGNOSIS — M1612 Unilateral primary osteoarthritis, left hip: Secondary | ICD-10-CM | POA: Diagnosis present

## 2013-10-25 DIAGNOSIS — I428 Other cardiomyopathies: Secondary | ICD-10-CM | POA: Diagnosis not present

## 2013-10-25 LAB — BASIC METABOLIC PANEL
BUN: 18 mg/dL (ref 6–23)
CO2: 26 mEq/L (ref 19–32)
Calcium: 8.4 mg/dL (ref 8.4–10.5)
Chloride: 103 mEq/L (ref 96–112)
Creatinine, Ser: 1.25 mg/dL (ref 0.50–1.35)
GFR calc Af Amer: 62 mL/min — ABNORMAL LOW (ref >90)
GFR calc non Af Amer: 54 mL/min — ABNORMAL LOW (ref >90)
Glucose, Bld: 132 mg/dL — ABNORMAL HIGH (ref 70–99)
Potassium: 3.7 mEq/L (ref 3.5–5.1)
Sodium: 138 mEq/L (ref 135–145)

## 2013-10-25 LAB — CBC
HCT: 25.9 % — ABNORMAL LOW (ref 39.0–52.0)
Hemoglobin: 8.8 g/dL — ABNORMAL LOW (ref 13.0–17.0)
MCH: 32.6 pg (ref 26.0–34.0)
MCHC: 34 g/dL (ref 30.0–36.0)
MCV: 95.9 fL (ref 78.0–100.0)
Platelets: 185 10*3/uL (ref 150–400)
RBC: 2.7 MIL/uL — ABNORMAL LOW (ref 4.22–5.81)
RDW: 13.7 % (ref 11.5–15.5)
WBC: 9.9 10*3/uL (ref 4.0–10.5)

## 2013-10-25 MED ORDER — OXYCODONE HCL 5 MG PO TABS: 5.0000 mg | ORAL_TABLET | ORAL | Status: DC | PRN

## 2013-10-25 MED ORDER — METHOCARBAMOL 500 MG PO TABS: 500.0000 mg | ORAL_TABLET | Freq: Three times a day (TID) | ORAL | Status: DC | PRN

## 2013-10-25 MED ORDER — RIVAROXABAN 20 MG PO TABS: 20.0000 mg | ORAL_TABLET | Freq: Every day | ORAL | Status: DC

## 2013-10-25 NOTE — Progress Notes (Signed)
Urology Progress Note : foley out: pt without void yet.  He has not had urge yet.   Subjective:     No acute urologic events overnight. Ambulation:   negative Flatus:    positive Bowel movement  positive  Pain: some relief  L-3  Objective:  Blood pressure 130/80, pulse 94, temperature 98.5 F (36.9 C), temperature source Oral, resp. rate 18, height 5' 6.93" (1.7 m), weight 118.8 kg (261 lb 14.5 oz), SpO2 94.00%.  Physical Exam:  General:  No acute distress, awake  Genitourinary:  wnl Foley: out    I/O last 3 completed shifts: In: 1625 [P.O.:1550; Other:75] Out: 2250 [Urine:2250]  Recent Labs     10/24/13  0515  10/25/13  0340  HGB  9.7*  8.8*  WBC  7.8  9.9  PLT  188  185    Recent Labs     10/24/13  0515  10/25/13  0340  NA  139  138  K  3.8  3.7  CL  103  103  CO2  26  26  BUN  16  18  CREATININE  1.27  1.25  CALCIUM  8.3*  8.4  GFRNONAA  53*  54*  GFRAA  61*  62*     No results found for this basename: PT, INR, APTT,  in the last 72 hours   No components found with this basename: ABG,   Assessment/Plan: Awaiting voiding trial. Flomax.

## 2013-10-25 NOTE — Progress Notes (Signed)
SUBJECTIVE:  No complaints  OBJECTIVE:   Vitals:   Filed Vitals:   10/24/13 1600 10/24/13 2219 10/25/13 0002 10/25/13 0647  BP:  124/74  130/80  Pulse:   90 94  Temp:   98.4 F (36.9 C) 98.5 F (36.9 C)  TempSrc:   Oral Oral  Resp: 18  18 18   Height:      Weight:      SpO2:   96% 94%   I&O's:   Intake/Output Summary (Last 24 hours) at 10/25/13 0901 Last data filed at 10/25/13 0700  Gross per 24 hour  Intake   1040 ml  Output   1750 ml  Net   -710 ml   TELEMETRY: Reviewed telemetry pt in afib     PHYSICAL EXAM General: Well developed, well nourished, in no acute distress Head: Eyes PERRLA, No xanthomas.   Normal cephalic and atramatic  Lungs:   Clear bilaterally to auscultation and percussion. Heart:  Irregularly irregular S1 S2 Pulses are 2+ & equal. Abdomen: Bowel sounds are positive, abdomen soft and non-tender without masses  Extremities:   No clubbing, cyanosis or edema.  DP +1 Neuro: Alert and oriented X 3. Psych:  Good affect, responds appropriately   LABS: Basic Metabolic Panel:  Recent Labs  16/10/96 0515 10/25/13 0340  NA 139 138  K 3.8 3.7  CL 103 103  CO2 26 26  GLUCOSE 138* 132*  BUN 16 18  CREATININE 1.27 1.25  CALCIUM 8.3* 8.4   Liver Function Tests: No results found for this basename: AST, ALT, ALKPHOS, BILITOT, PROT, ALBUMIN,  in the last 72 hours No results found for this basename: LIPASE, AMYLASE,  in the last 72 hours CBC:  Recent Labs  10/24/13 0515 10/25/13 0340  WBC 7.8 9.9  HGB 9.7* 8.8*  HCT 29.1* 25.9*  MCV 96.0 95.9  PLT 188 185   Cardiac Enzymes: No results found for this basename: CKTOTAL, CKMB, CKMBINDEX, TROPONINI,  in the last 72 hours BNP: No components found with this basename: POCBNP,  D-Dimer: No results found for this basename: DDIMER,  in the last 72 hours Hemoglobin A1C: No results found for this basename: HGBA1C,  in the last 72 hours Fasting Lipid Panel: No results found for this basename: CHOL,  HDL, LDLCALC, TRIG, CHOLHDL, LDLDIRECT,  in the last 72 hours Thyroid Function Tests: No results found for this basename: TSH, T4TOTAL, FREET3, T3FREE, THYROIDAB,  in the last 72 hours Anemia Panel: No results found for this basename: VITAMINB12, FOLATE, FERRITIN, TIBC, IRON, RETICCTPCT,  in the last 72 hours Coag Panel:   Lab Results  Component Value Date   INR 0.99 10/17/2013    RADIOLOGY: Chest 2 View  10/17/2013   CLINICAL DATA:  Preoperative Versed prior to exam for hip surgery.  EXAM: CHEST  2 VIEW  COMPARISON:  None.  FINDINGS: Heart size is normal. The aorta is unfolded. The pulmonary vascularity is normal. The lungs are clear. No effusions. Chronic degenerative changes affect the spine.  IMPRESSION: No active disease.  Unfolded aorta .   Electronically Signed   By: Paulina Fusi M.D.   On: 10/17/2013 15:34   Dg Pelvis Portable  10/23/2013   CLINICAL DATA:  Status post left hip arthroplasty.  EXAM: PORTABLE PELVIS 1-2 VIEWS  COMPARISON:  None.  FINDINGS: Total left hip arthroplasty is identified. Hardware is grossly intact. The native osseous structures. Grossly unremarkable. Skin staples identified about the soft tissues. Foley catheter is appreciated. Degenerative changes identified within  the lower lumbar spine as well as within the sacroiliac regions. Degenerative changes identified involving the right hip.  IMPRESSION: Patient is status post total left hip arthroplasty.   Electronically Signed   By: Salome Holmes M.D.   On: 10/23/2013 12:27   Dg Hip Portable 1 View Left  10/23/2013   CLINICAL DATA:  Left hip arthroplasty  EXAM: PORTABLE LEFT HIP - 1 VIEW  COMPARISON:  None.  FINDINGS: New left hip prosthesis is well-seated and aligned. There is no acute fracture or evidence of an operative complication.  IMPRESSION: Well-positioned left hip prosthesis.   Electronically Signed   By: Amie Portland M.D.   On: 10/23/2013 13:30    IMPRESSIONS:  1. Paroxysmal atrial fibrillation  following hip replacement - rate controlled on coreg 2. Nonischemic cardiomyopathy by history - echo with EF 25-30% (this is what his baseline has been in the past and he has refused AICD)  on beta blockers/Imdur/hydralazine/aldactone/lasix 3. Hypertension - well controlled 4. Obesity  RECOMMENDATION:  1. Continue beta blockers for rate control  2. Anticoagulation  with XARELTO in light of recent hip surgery and atrial fibrillation  3. Early followup with the [facility] cardiologist for atrial fibrillation and anticoagulation.    Quintella Reichert, MD  10/25/2013  9:01 AM

## 2013-10-25 NOTE — Progress Notes (Signed)
Physical Therapy Treatment Patient Details Name: Ryan Macdonald MRN: [id number] DOB: 06-14-1934 Today's Date: 10/25/2013 Time: 4098-1191 PT Time Calculation (min): 31 min  PT Assessment / Plan / Recommendation  History of Present Illness Left total hip replacement and Urethral dilatation and insertion of catheter    PT Comments   Pt cont's to progress with mobility.  Initiated LE exercises today.  Cont with current POC to maximize functional recovery prior to d/c home.     Follow Up Recommendations  Home health PT     Does the patient have the potential to tolerate intense rehabilitation     Barriers to Discharge        Equipment Recommendations  None recommended by PT    Recommendations for Other Services    Frequency 7X/week   Progress towards PT Goals Progress towards PT goals: Progressing toward goals  Plan Current plan remains appropriate    Precautions / Restrictions Precautions Precautions: Posterior Hip;Fall Precaution Comments: reviewed hip precautions Restrictions LLE Weight Bearing: Weight bearing as tolerated   Pertinent Vitals/Pain 4/10 Lt hip.  Repositioned for comfort.  RN notified for pain medication at end of session.       Mobility  Bed Mobility Bed Mobility: Supine to Sit;Sitting - Scoot to Edge of Bed Supine to Sit: 4: Min assist;HOB flat Sitting - Scoot to [place] of Bed: 4: Min assist Details for Bed Mobility Assistance: (A) for LLE management & prevent Internal rotation of LLE.  Cues for technique.  Incr time & effortful but did not need assist to lift shoulders/trunk to sitting upright Transfers Transfers: Sit to Stand;Stand to Sit Sit to Stand: 4: Min assist;With upper extremity assist;From bed Stand to Sit: 4: Min guard;With upper extremity assist;With armrests;To chair/3-in-1 Details for Transfer Assistance: cues to reinforce hadn placement & LLE positioning.    Ambulation/Gait Ambulation/Gait Assistance: 4: Min guard Ambulation Distance  (Feet): 130 Feet Assistive device: Rolling walker Ambulation/Gait Assistance Details: Pt relies heavily on RW with UE's.  Step through but small stride.  Standing rest breaks after ~4-5 steps due to fatigue/pain/mild dizziness.     Gait Pattern: Step-through pattern;Decreased stride length;Decreased step length - right;Decreased step length - left;Decreased weight shift to left;Antalgic Gait velocity: Decreased Stairs: No    Exercises Total Joint Exercises Ankle Circles/Pumps: AROM;Both;10 reps Gluteal Sets: AROM;Both;10 reps Heel Slides: AAROM;Strengthening;Left;10 reps Hip ABduction/ADduction: AAROM;Strengthening;Left;10 reps Straight Leg Raises: AAROM;Strengthening;Left;10 reps Marching in Standing: AROM;Strengthening;Left;10 reps;Standing     PT Goals (current goals can now be found in the care plan section) Acute Rehab PT Goals PT Goal Formulation: With patient Time For Goal Achievement: 10/31/13 Potential to Achieve Goals: Good  Visit Information  Last PT Received On: 10/25/13 Assistance Needed: +1 History of Present Illness: Left total hip replacement and Urethral dilatation and insertion of catheter     Subjective Data      Cognition  Cognition Arousal/Alertness: Awake/alert Behavior During Therapy: [facility] for tasks assessed/performed    Balance     End of Session PT - End of Session Activity Tolerance: Patient tolerated treatment well Patient left: in chair;with call bell/phone within reach Nurse Communication: Mobility status   GP     Lara Mulch 10/25/2013, 8:14 AM   Verdell Face, PTA [phone] 10/25/2013

## 2013-10-25 NOTE — Progress Notes (Signed)
Patient ID: Ryan Macdonald, male   DOB: Nov 13, 1934, 79 y.o.   MRN: [id number] Patient progressing well with therapy. Hemoglobin 8.8. Plan for discharge to home tomorrow.

## 2013-10-25 NOTE — Discharge Summary (Signed)
Norlene Campbell, MD   Jacqualine Code, PA-C [address], [place], [place]  [address]                             [phone]  PATIENT ID: Ryan Macdonald        MRN:  [id number]          DOB/AGE: 03-29-1934 / 79 y.o.    DISCHARGE SUMMARY  ADMISSION DATE:    10/23/2013 DISCHARGE DATE:   10/26/2013   ADMISSION DIAGNOSIS: LEFT HIP OSTEOARTHRITIS    DISCHARGE DIAGNOSIS:  LEFT HIP OSTEOARTHRITIS/ urethral stricture    ADDITIONAL DIAGNOSIS: Principal Problem:   Osteoarthritis of left hip Active Problems:   Nonischemic cardiomyopathy   S/P hip replacement   Atrial fibrillation  Past Medical History  Diagnosis Date  . Nonischemic cardiomyopathy     EF 25% by echo in 2012, catheterization 5 years ago in [place] reportedly showed no significant coronary disease  . Hypertension   . Cat scratch fever     removed mass on right side neck  . Cataracts, bilateral   . High cholesterol   . Osteoarthritis   . Kidney stones     PROCEDURE: Procedure(s): TOTAL HIP ARTHROPLASTY LEFT CYSTOSCOPY WITH URETHRAL DILATATION  on 10/23/2013  CONSULTS:  Treatment Team:  Kathi Ludwig, MD Othella Boyer, MD   HISTORY: [name] is a very pleasant 79 year old African American male who was seen today for evaluation of his left hip. He has had problems with his left hip in regard to pain probably for over the last 15-18 years. He was initially treated in the [facility] in [place] and also [place], [place]. At one point he actually suggested that he undergo a replacement, but he was not ready any of those times. He was just having so much pain and inconvenience now that he returns for further evaluation. He has pain in the left groin and some referred to the left knee just proximal to it. He is not really having much in the way of numbness and tingling. He has to walk with a cane now. He has pain with every step. He has markedly limited range of motion secondary to pain. He denies any  neurovascular compromise at this time. Seen today for re-evaluation.    HOSPITAL COURSE:  Ryan Macdonald is a 79 y.o. admitted on 10/23/2013 and found to have a diagnosis of LEFT HIP OSTEOARTHRITIS/ urethral stricture.  After appropriate laboratory studies were obtained  they were taken to the operating room on 10/23/2013 and underwent  Procedure(s): TOTAL HIP ARTHROPLASTY LEFT CYSTOSCOPY WITH URETHRAL DILATATION     They were given perioperative antibiotics:  Anti-infectives   Start     Dose/Rate Route Frequency Ordered Stop   10/23/13 1500  ceFAZolin (ANCEF) IVPB 2 g/50 mL premix     2 g 100 mL/hr over 30 Minutes Intravenous Every 6 hours 10/23/13 1328 10/23/13 2214   10/23/13 0600  ceFAZolin (ANCEF) 3 g in dextrose 5 % 50 mL IVPB     3 g 160 mL/hr over 30 Minutes Intravenous On call to O.R. 10/22/13 1406 10/23/13 0806    .  Tolerated the procedure well.  Placed with a foley intraoperatively after dilatation/cystoscopy by Dr Patsi Sears.    Toradol was given post op.  POD #1, allowed out of bed to a chair.  PT for ambulation and exercise program.  IV saline locked.  O2 discontionued. Patient consulted and evaluated  by Dr Donnie Aho and Echocardiogram was performed revealing EF 25-30% and unchanged.  POD #2, continued PT and ambulation with some mild lightheadedness. BP & pulse stable.  Foley D/C'd in morning per Dr Imelda Pillow request.  POD #3, continued PT and ambulation.  The remainder of the hospital course was dedicated to ambulation and strengthening.   The patient was discharged on 3 Days Post-Op in  Stable condition.  Blood products given:none  DIAGNOSTIC STUDIES: Recent vital signs: Patient Vitals for the past 24 hrs:  BP Temp Temp src Pulse Resp SpO2  10/25/13 0647 130/80 mmHg 98.5 F (36.9 C) Oral 94 18 94 %  10/25/13 0002 - 98.4 F (36.9 C) Oral 90 18 96 %  10/24/13 2219 124/74 mmHg - - - - -  10/24/13 1600 - - - - 18 -  10/24/13 1400 108/63 mmHg 98.8 F (37.1 C) - 88  17 95 %  10/24/13 1151 - - - - 18 -       Recent laboratory studies:  Recent Labs  10/24/13 0515 10/25/13 0340  WBC 7.8 9.9  HGB 9.7* 8.8*  HCT 29.1* 25.9*  PLT 188 185    Recent Labs  10/24/13 0515 10/25/13 0340  NA 139 138  K 3.8 3.7  CL 103 103  CO2 26 26  BUN 16 18  CREATININE 1.27 1.25  GLUCOSE 138* 132*  CALCIUM 8.3* 8.4   Lab Results  Component Value Date   INR 0.99 10/17/2013     Recent Radiographic Studies :  Chest 2 View  10/17/2013   CLINICAL DATA:  Preoperative Versed prior to exam for hip surgery.  EXAM: CHEST  2 VIEW  COMPARISON:  None.  FINDINGS: Heart size is normal. The aorta is unfolded. The pulmonary vascularity is normal. The lungs are clear. No effusions. Chronic degenerative changes affect the spine.  IMPRESSION: No active disease.  Unfolded aorta .   Electronically Signed   By: Paulina Fusi M.D.   On: 10/17/2013 15:34   Dg Pelvis Portable  10/23/2013   CLINICAL DATA:  Status post left hip arthroplasty.  EXAM: PORTABLE PELVIS 1-2 VIEWS  COMPARISON:  None.  FINDINGS: Total left hip arthroplasty is identified. Hardware is grossly intact. The native osseous structures. Grossly unremarkable. Skin staples identified about the soft tissues. Foley catheter is appreciated. Degenerative changes identified within the lower lumbar spine as well as within the sacroiliac regions. Degenerative changes identified involving the right hip.  IMPRESSION: Patient is status post total left hip arthroplasty.   Electronically Signed   By: Salome Holmes M.D.   On: 10/23/2013 12:27   Dg Hip Portable 1 View Left  10/23/2013   CLINICAL DATA:  Left hip arthroplasty  EXAM: PORTABLE LEFT HIP - 1 VIEW  COMPARISON:  None.  FINDINGS: New left hip prosthesis is well-seated and aligned. There is no acute fracture or evidence of an operative complication.  IMPRESSION: Well-positioned left hip prosthesis.   Electronically Signed   By: Amie Portland M.D.   On: 10/23/2013 13:30     DISCHARGE INSTRUCTIONS: Discharge Orders   Future Orders Complete By Expires   Call MD / Call 911  As directed    Comments:     If you experience chest pain or shortness of breath, CALL 911 and be transported to the hospital emergency room.  If you develope a fever above 101 F, pus (white drainage) or increased drainage or redness at the wound, or calf pain, call your surgeon's office.  Change dressing  As directed    Comments:     You may change your dressing on Sunday, then change the dressing daily with sterile 4 x 4 inch gauze dressing and paper tape.  You may clean the incision with alcohol prior to redressing   Constipation Prevention  As directed    Comments:     Drink plenty of fluids.  Prune juice and/or coffee may be helpful.  You may use a stool softener, such as Colace (over the counter) 100 mg twice a day.  Use MiraLax (over the counter) for constipation as needed but this may take several days to work.  Mag Citrate --OR-- Milk of Magnesia may also be used but follow directions on the label.   Diet - low sodium heart healthy  As directed    Driving restrictions  As directed    Comments:     No driving for 6 weeks   Follow the hip precautions as taught in Physical Therapy  As directed    Increase activity slowly as tolerated  As directed    Lifting restrictions  As directed    Comments:     No lifting for 6 weeks   Patient may shower  As directed    Comments:     You may shower over the brown dressing.  Once the dressing is removed you may shower without a dressing once there is no drainage.  Do not wash over the wound.  If drainage remains, cover wound with plastic wrap and then shower.   TED hose  As directed    Comments:     Use stockings (TED hose) for 1-2 weeks on operative leg(s).  You may remove them at night for sleeping. May stop the NON-operative leg stocking when you go home.   Weight bearing as tolerated  As directed    Questions:     Laterality:      Extremity:        DISCHARGE MEDICATIONS:     Medication List    STOP taking these medications       aspirin EC 81 MG tablet     ciprofloxacin 500 MG tablet  Commonly known as:  CIPRO     traMADol 50 MG tablet  Commonly known as:  ULTRAM      TAKE these medications       carvedilol 25 MG tablet  Commonly known as:  COREG  Take 25 mg by mouth 2 (two) times daily with a meal.     cyanocobalamin 1000 MCG tablet  Take 2,000 mcg by mouth daily.     furosemide 40 MG tablet  Commonly known as:  LASIX  Take 40 mg by mouth 2 (two) times daily.     hydrALAZINE 10 MG tablet  Commonly known as:  APRESOLINE  Take 20 mg by mouth 2 (two) times daily.     isosorbide dinitrate 40 MG CR tablet  Commonly known as:  ISOCHRON  Take 40 mg by mouth 3 (three) times daily.     methocarbamol 500 MG tablet  Commonly known as:  ROBAXIN  Take 1 tablet (500 mg total) by mouth every 8 (eight) hours as needed for muscle spasms.     multivitamin with minerals Tabs tablet  Take 1 tablet by mouth daily.     niacin 500 MG CR tablet  Commonly known as:  NIASPAN  Take 500 mg by mouth at bedtime.     oxyCODONE 5 MG immediate release tablet  Commonly known as:  Oxy IR/ROXICODONE  Take 1-2 tablets (5-10 mg total) by mouth every 4 (four) hours as needed for moderate pain, severe pain or breakthrough pain.     Rivaroxaban 20 MG Tabs tablet  Commonly known as:  XARELTO  Take 1 tablet (20 mg total) by mouth daily with supper.     simvastatin 80 MG tablet  Commonly known as:  ZOCOR  Take 40 mg by mouth every evening.     spironolactone 25 MG tablet  Commonly known as:  ALDACTONE  Take 25 mg by mouth at bedtime.     tamsulosin 0.4 MG Caps capsule  Commonly known as:  FLOMAX  Take 0.4 mg by mouth at bedtime.        FOLLOW UP VISIT:       Follow-up Information   Follow up with Ryan Fanguy, PA-C. Schedule an appointment as soon as possible for a visit on 11/07/2013.   Specialty:   Orthopedic Surgery   Contact information:   [address] [place] [address] [phone]       DISPOSITION:   Home  CONDITION:  Stable   Ryan Macdonald 10/25/2013, 9:13 AM

## 2013-10-25 NOTE — Care Management Note (Signed)
Cm spoke with patient concerning discharge disposition home on Xarelto. [facility]. Pt provided with Xarelto 30 day free trial card. Cm activated card online. Pt informed pt card accepted at any pharmacy.   Roxy Manns Veleta Yamamoto,RN,MSN [phone]

## 2013-10-26 LAB — CBC
HCT: 25.9 % — ABNORMAL LOW (ref 39.0–52.0)
Hemoglobin: 8.8 g/dL — ABNORMAL LOW (ref 13.0–17.0)
MCV: 94.9 fL (ref 78.0–100.0)
RDW: 13.6 % (ref 11.5–15.5)
WBC: 8.6 10*3/uL (ref 4.0–10.5)

## 2013-10-26 LAB — BASIC METABOLIC PANEL
BUN: 22 mg/dL (ref 6–23)
CO2: 27 mEq/L (ref 19–32)
Chloride: 100 mEq/L (ref 96–112)
Creatinine, Ser: 1.17 mg/dL (ref 0.50–1.35)
GFR calc Af Amer: 68 mL/min — ABNORMAL LOW (ref >90)
Sodium: 137 mEq/L (ref 135–145)

## 2013-10-26 NOTE — Progress Notes (Signed)
10/26/13 OT recommending HHOT. Contacted Debbie with [facility] and set up HHOT. Patient states that he has an 80lb package at home so he thinks that his equipment has been delivered to his home. Jacquelynn Cree RN, BSN, CCM   Sharmon Leyden, RN Registered Nurse Signed CASE MANAGEMENT Care Management Note Service date: 10/25/2013 1:45 PM Cm spoke with patient concerning discharge disposition home on Xarelto. [facility]. Pt provided with Xarelto 30 day free trial card. Cm activated card online. Pt informed pt card accepted at any pharmacy.    10/24/13 Set up with HHPT with [facility] Clement by MD office.Spoke with patient no change in discharge plan. Spoke with patient about DME, he [facility] to [facility].Contacted Dr. Arlyss Gandy office to get approval for DME. Spoke with Ms Cliffton Asters who instructed me to fax the orders for rolling walker and 3N1 to [phone]. They will have the equipment sent to the patient's home. Faxed orders and received confirmation. Will continue to follow for d/c needs. Jacquelynn Cree RN, BSN, CCM

## 2013-10-26 NOTE — Progress Notes (Signed)
Occupational Therapy Treatment Patient Details Name: Ryan Macdonald MRN: [id number] DOB: 1934-07-22 Today's Date: 10/26/2013 Time: 6213-0865 OT Time Calculation (min): 9 min  OT Assessment / Plan / Recommendation  History of present illness Left total hip replacement and Urethral dilatation and insertion of catheter    OT comments  [name] presenting to acute OT slowly so will now recommend HHOT. Pt did report that wife will A him with LBADLs until he can do them for himself without hip precautions.  Follow Up Recommendations  Home health OT       Equipment Recommendations  3 in 1 bedside comode       Frequency Min 2X/week   Progress towards OT Goals Progress towards OT goals: Progressing toward goals  Plan Discharge plan needs to be updated    Precautions / Restrictions Precautions Precautions: Posterior Hip;Fall Precaution Comments: VCs to remember not to turn left knee in as he stands up Restrictions LLE Weight Bearing: Weight bearing as tolerated       ADL  Transfers/Ambulation Related to ADLs: Only partially performed tub transfer with tub bench (as this is what pt thinks he has at home). He sat down on tub bench with min guard A with therapist steadying tub bench as he reached back with his RUE for back rest of tub bench and kept LUE up on RW, he then turned to the point where he would start to bring legs up and over the tub when he said, I probably won't be doing this right away at home; however he did actually then "walk himself through the next steps to the transfer verbally"      OT Goals(current goals can now be found in the care plan section) ADL Goals Pt Will Perform Tub/Shower Transfer: with min assist;ambulating;tub bench  Visit Information  Last OT Received On: 10/26/13 Assistance Needed: +1 History of Present Illness: Left total hip replacement and Urethral dilatation and insertion of catheter           Cognition  Cognition Arousal/Alertness:  Awake/alert Behavior During Therapy: [facility] for tasks assessed/performed Memory: Decreased recall of precautions    Mobility  Transfers Transfers: Sit to Stand;Stand to Sit Sit to Stand: 4: Min assist;With upper extremity assist (from tub bench) Stand to Sit: 4: Min guard;With upper extremity assist (to tub bench) Details for Transfer Assistance: VCs for safe hand placement and positioning of LLE          End of Session OT - End of Session Equipment Utilized During Treatment: Rolling walker Activity Tolerance: Patient tolerated treatment well Patient left:  (walking with PT back to room) Nurse Communication:  (need to add HHOT for pt)       Evette Georges [phone] 10/26/2013, 9:05 AM

## 2013-10-26 NOTE — Discharge Summary (Signed)
Physician Discharge Summary  Patient ID: Ryan Macdonald MRN: [id number] DOB/AGE: 1934-05-07 79 y.o.  Admit date: 10/23/2013 Discharge date: 10/26/2013  Admission Diagnoses: Osteoarthritis left hip  Discharge Diagnoses:  Principal Problem:   Osteoarthritis of left hip Active Problems:   Nonischemic cardiomyopathy   S/P hip replacement   Atrial fibrillation   Discharged Condition: stable  Hospital Course: Patient's hospital course was essentially unremarkable. He underwent total hip arthroplasty. Postoperatively he progressed well with physical therapy and was discharged to home in stable condition  Consults: None  Significant Diagnostic Studies: labs: Routine labs  Treatments: surgery: See operative note  Discharge Exam: Blood pressure 112/61, pulse 78, temperature 98.3 F (36.8 C), temperature source Oral, resp. rate 16, height 5' 6.93" (1.7 m), weight 118.8 kg (261 lb 14.5 oz), SpO2 93.00%. Incision/Wound: dressing clean dry and intact  Disposition: Final discharge disposition not confirmed  Discharge Orders   Future Orders Complete By Expires   Call MD / Call 911  As directed    Comments:     If you experience chest pain or shortness of breath, CALL 911 and be transported to the hospital emergency room.  If you develope a fever above 101 F, pus (white drainage) or increased drainage or redness at the wound, or calf pain, call your surgeon's office.   Change dressing  As directed    Comments:     You may change your dressing on Sunday, then change the dressing daily with sterile 4 x 4 inch gauze dressing and paper tape.  You may clean the incision with alcohol prior to redressing   Constipation Prevention  As directed    Comments:     Drink plenty of fluids.  Prune juice and/or coffee may be helpful.  You may use a stool softener, such as Colace (over the counter) 100 mg twice a day.  Use MiraLax (over the counter) for constipation as needed but this may take several  days to work.  Mag Citrate --OR-- Milk of Magnesia may also be used but follow directions on the label.   Diet - low sodium heart healthy  As directed    Driving restrictions  As directed    Comments:     No driving for 6 weeks   Follow the hip precautions as taught in Physical Therapy  As directed    Increase activity slowly as tolerated  As directed    Lifting restrictions  As directed    Comments:     No lifting for 6 weeks   Patient may shower  As directed    Comments:     You may shower over the brown dressing.  Once the dressing is removed you may shower without a dressing once there is no drainage.  Do not wash over the wound.  If drainage remains, cover wound with plastic wrap and then shower.   TED hose  As directed    Comments:     Use stockings (TED hose) for 1-2 weeks on operative leg(s).  You may remove them at night for sleeping. May stop the NON-operative leg stocking when you go home.   Weight bearing as tolerated  As directed    Questions:     Laterality:     Extremity:         Medication List    STOP taking these medications       aspirin EC 81 MG tablet     ciprofloxacin 500 MG tablet  Commonly known as:  CIPRO  traMADol 50 MG tablet  Commonly known as:  ULTRAM      TAKE these medications       carvedilol 25 MG tablet  Commonly known as:  COREG  Take 25 mg by mouth 2 (two) times daily with a meal.     cyanocobalamin 1000 MCG tablet  Take 2,000 mcg by mouth daily.     furosemide 40 MG tablet  Commonly known as:  LASIX  Take 40 mg by mouth 2 (two) times daily.     hydrALAZINE 10 MG tablet  Commonly known as:  APRESOLINE  Take 20 mg by mouth 2 (two) times daily.     isosorbide dinitrate 40 MG CR tablet  Commonly known as:  ISOCHRON  Take 40 mg by mouth 3 (three) times daily.     methocarbamol 500 MG tablet  Commonly known as:  ROBAXIN  Take 1 tablet (500 mg total) by mouth every 8 (eight) hours as needed for muscle spasms.      multivitamin with minerals Tabs tablet  Take 1 tablet by mouth daily.     niacin 500 MG CR tablet  Commonly known as:  NIASPAN  Take 500 mg by mouth at bedtime.     oxyCODONE 5 MG immediate release tablet  Commonly known as:  Oxy IR/ROXICODONE  Take 1-2 tablets (5-10 mg total) by mouth every 4 (four) hours as needed for moderate pain, severe pain or breakthrough pain.     Rivaroxaban 20 MG Tabs tablet  Commonly known as:  XARELTO  Take 1 tablet (20 mg total) by mouth daily with supper.     simvastatin 80 MG tablet  Commonly known as:  ZOCOR  Take 40 mg by mouth every evening.     spironolactone 25 MG tablet  Commonly known as:  ALDACTONE  Take 25 mg by mouth at bedtime.     tamsulosin 0.4 MG Caps capsule  Commonly known as:  FLOMAX  Take 0.4 mg by mouth at bedtime.           Follow-up Information   Follow up with [facility], PA-C. Schedule an appointment as soon as possible for a visit on 11/07/2013.   Specialty:  Orthopedic Surgery   Contact information:   [address] [place] [address] [phone]       Signed: Nadara Mustard 10/26/2013, 6:47 AM

## 2013-10-26 NOTE — Progress Notes (Signed)
Physical Therapy Treatment Patient Details Name: Ryan Macdonald MRN: [id number] DOB: 10/16/1934 Today's Date: 10/26/2013 Time: 1308-6578 PT Time Calculation (min): 44 min  PT Assessment / Plan / Recommendation  History of Present Illness Left total hip replacement and Urethral dilatation and insertion of catheter    PT Comments   Pt cont's to struggle with bed mobility & sit<>stand transfers but moves well once he is up.  Requires verbal & tactile cues to reinforce "no internal rotation" with functional mobility.     Follow Up Recommendations  Home health PT     Does the patient have the potential to tolerate intense rehabilitation     Barriers to Discharge        Equipment Recommendations  None recommended by PT    Recommendations for Other Services    Frequency 7X/week   Progress towards PT Goals Progress towards PT goals: Progressing toward goals  Plan Current plan remains appropriate    Precautions / Restrictions Precautions Precautions: Posterior Hip;Fall Precaution Comments: VCs to remember not to turn left knee in as he stands up (Cues for no "Internal rotation" with mobility) Restrictions LLE Weight Bearing: Weight bearing as tolerated       Mobility  Bed Mobility Bed Mobility: Supine to Sit;Sitting - Scoot to Edge of Bed Supine to Sit: 4: Min guard;With rails;HOB flat Sitting - Scoot to Edge of Bed: 4: Min guard Details for Bed Mobility Assistance: Cues to not rotate LLE inwards.   Incr time & efforful Transfers Transfers: Sit to Stand;Stand to Sit Sit to Stand: 4: Min assist;With upper extremity assist;From bed;With armrests;From chair/3-in-1 Stand to Sit: With upper extremity assist;With armrests;To chair/3-in-1;4: Min guard Details for Transfer Assistance: cues for hand placement & compliance with no internal rotation of LLE.   Ambulation/Gait Ambulation/Gait Assistance: 4: Min guard Ambulation Distance (Feet): 100 Feet Assistive device: Rolling  walker Ambulation/Gait Assistance Details: cues to decrease reliance of UE's on RW & increase WBing LLE, cues to roll RW rather than pick it up when advancing it.   Gait Pattern: Step-through pattern;Decreased stride length;Decreased weight shift to left;Antalgic Gait velocity: Decreased General Gait Details: Requires multiple short standing rest breaks Stairs: Yes Stairs Assistance: 4: Min guard Stairs Assistance Details (indicate cue type and reason): Pt able to return demonstration of proper sequencing.  Encouragement for increase hip/.knee fleixon LLE with stepping up Stair Management Technique: Two rails;Step to pattern;Forwards Number of Stairs: 3 Wheelchair Mobility Wheelchair Mobility: No      PT Goals (current goals can now be found in the care plan section) Acute Rehab PT Goals PT Goal Formulation: With patient Time For Goal Achievement: 10/31/13 Potential to Achieve Goals: Good  Visit Information  Last PT Received On: 10/26/13 Assistance Needed: +1 History of Present Illness: Left total hip replacement and Urethral dilatation and insertion of catheter     Subjective Data      Cognition  Cognition Arousal/Alertness: Awake/alert Behavior During Therapy: [facility] for tasks assessed/performed Memory: Decreased recall of precautions    Balance     End of Session PT - End of Session Activity Tolerance: Patient tolerated treatment well Patient left: in chair;with call bell/phone within reach Nurse Communication: Mobility status   GP     Lara Mulch 10/26/2013, 2:51 PM   Verdell Face, PTA [phone] 10/26/2013

## 2013-10-30 ENCOUNTER — Encounter (HOSPITAL_COMMUNITY): Payer: Self-pay | Admitting: Emergency Medicine

## 2013-10-30 ENCOUNTER — Emergency Department (HOSPITAL_COMMUNITY)
Admission: EM | Admit: 2013-10-30 | Discharge: 2013-10-30 | Disposition: A | Discharge status: Home / Self Care | Payer: Non-veteran care | Attending: Emergency Medicine | Admitting: Emergency Medicine

## 2013-10-30 DIAGNOSIS — Z8619 Personal history of other infectious and parasitic diseases: Secondary | ICD-10-CM | POA: Insufficient documentation

## 2013-10-30 DIAGNOSIS — Z87891 Personal history of nicotine dependence: Secondary | ICD-10-CM | POA: Insufficient documentation

## 2013-10-30 DIAGNOSIS — I480 Paroxysmal atrial fibrillation: Secondary | ICD-10-CM | POA: Diagnosis present

## 2013-10-30 DIAGNOSIS — Z87442 Personal history of urinary calculi: Secondary | ICD-10-CM | POA: Insufficient documentation

## 2013-10-30 DIAGNOSIS — Z8739 Personal history of other diseases of the musculoskeletal system and connective tissue: Secondary | ICD-10-CM | POA: Insufficient documentation

## 2013-10-30 DIAGNOSIS — I4891 Unspecified atrial fibrillation: Secondary | ICD-10-CM | POA: Diagnosis present

## 2013-10-30 DIAGNOSIS — E7800 Pure hypercholesterolemia, unspecified: Secondary | ICD-10-CM | POA: Insufficient documentation

## 2013-10-30 DIAGNOSIS — Z79899 Other long term (current) drug therapy: Secondary | ICD-10-CM | POA: Insufficient documentation

## 2013-10-30 DIAGNOSIS — Z9861 Coronary angioplasty status: Secondary | ICD-10-CM | POA: Insufficient documentation

## 2013-10-30 DIAGNOSIS — Z8669 Personal history of other diseases of the nervous system and sense organs: Secondary | ICD-10-CM | POA: Insufficient documentation

## 2013-10-30 DIAGNOSIS — I10 Essential (primary) hypertension: Secondary | ICD-10-CM | POA: Diagnosis present

## 2013-10-30 DIAGNOSIS — I428 Other cardiomyopathies: Secondary | ICD-10-CM

## 2013-10-30 DIAGNOSIS — R319 Hematuria, unspecified: Secondary | ICD-10-CM | POA: Insufficient documentation

## 2013-10-30 LAB — BASIC METABOLIC PANEL
BUN: 20 mg/dL (ref 6–23)
Calcium: 9 mg/dL (ref 8.4–10.5)
Chloride: 100 mEq/L (ref 96–112)
Creatinine, Ser: 1.16 mg/dL (ref 0.50–1.35)
GFR calc Af Amer: 68 mL/min — ABNORMAL LOW (ref >90)
GFR calc non Af Amer: 59 mL/min — ABNORMAL LOW (ref >90)
Potassium: 4.5 mEq/L (ref 3.5–5.1)
Sodium: 138 mEq/L (ref 135–145)

## 2013-10-30 LAB — CBC
HCT: 28.2 % — ABNORMAL LOW (ref 39.0–52.0)
MCH: 32.3 pg (ref 26.0–34.0)
MCHC: 33.7 g/dL (ref 30.0–36.0)
MCV: 95.9 fL (ref 78.0–100.0)
RDW: 13.6 % (ref 11.5–15.5)

## 2013-10-30 LAB — PROTIME-INR: Prothrombin Time: 20.2 seconds — ABNORMAL HIGH (ref 11.6–15.2)

## 2013-10-30 NOTE — Discharge Instructions (Signed)
Your hemoglobin (measure a red blood cells) is rising since it was last taken. You may continue to bleed, however you should not stop taking your xarelto. [facility] up with [facility] and your orthopedic surgeon as soon as possible.  Return immediately to the nearest ED if you develop chest pain, shortness of breath, dizziness or fainting, severe bleeding, or other concerns. Or if your bleeding does not resolve after 20 minutes of firm, constant pressure.   Hematuria, Adult Hematuria (blood in your urine) can be caused by a bladder infection (cystitis), kidney infection (pyelonephritis), prostate infection (prostatitis), or kidney stone. Infections will usually respond to antibiotics (medications which kill germs), and a kidney stone will usually pass through your urine without further treatment. If you were put on antibiotics, take all the medicine until gone. You may feel better in a few days, but take all of your medicine or the infection may not respond and become more difficult to treat. If antibiotics were not given, an infection did not cause the blood in the urine. A further work up to find out the reason may be needed. HOME CARE INSTRUCTIONS   Drink lots of fluid, 3 to 4 quarts a day. If you have been diagnosed with an infection, cranberry juice is especially recommended, in addition to large amounts of water.  Avoid caffeine, tea, and carbonated beverages, because they tend to irritate the bladder.  Avoid alcohol as it may irritate the prostate.  Only take over-the-counter or prescription medicines for pain, discomfort, or fever as directed by your caregiver.  If you have been diagnosed with a kidney stone follow your caregivers instructions regarding straining your urine to catch the stone. TO PREVENT FURTHER INFECTIONS:  Empty the bladder often. Avoid holding urine for long periods of time.  After a bowel movement, women should cleanse front to back. Use each tissue only  once.  Empty the bladder before and after sexual intercourse if you are a male.  Return to your caregiver if you develop back pain, fever, nausea (feeling sick to your stomach), vomiting, or your symptoms (problems) are not better in 3 days. Return sooner if you are getting worse. If you have been requested to return for further testing make sure to keep your appointments. If an infection is not the cause of blood in your urine, X-rays may be required. Your caregiver will discuss this with you. SEEK IMMEDIATE MEDICAL CARE IF:   You have a persistent fever over 102 F (38.9 C).  You develop severe vomiting and are unable to keep the medication down.  You develop severe back or abdominal pain despite taking your medications.  You begin passing a large amount of blood or clots in your urine.  You feel extremely weak or faint, or pass out. MAKE SURE YOU:   Understand these instructions.  Will watch your condition.  Will get help right away if you are not doing well or get worse. Document Released: 11/15/2005 Document Revised: 02/07/2012 Document Reviewed: 07/04/2008 [facility] Patient Information 2014 [place], [place].

## 2013-10-30 NOTE — ED Notes (Signed)
Pt from home, c/o light bleeding from utrethral opening. Pt states trickling of blood started around 0230 am. Pt recently but on xaralto post hip surgery. Pt states hx of kidney stones and recent urinary cath insertion by urologist prior to surgery. Pt denies pain. Bleeding controled pta. NSD VS stable

## 2013-10-30 NOTE — ED Provider Notes (Signed)
CSN: [id number]     Arrival date & time 10/30/13  0919 History   First MD Initiated Contact with Patient 10/30/13 [phone]     Chief Complaint  Patient presents with  . Bleeding/Bruising   (Consider location/radiation/quality/duration/timing/severity/associated sxs/prior Treatment) HPI Male presents emergency chief complaint of hematuria.  He is status post left total hip replacement on 10/23/2013.  Patient was discharged on 10/26/2013.  He's been taking several times since his discharge.  Patient states that last night while he was sleeping he felt something warm over his groin area he looked down and saw blood all over his sheets.  Patient went to the restroom to urinate and had a stream of bright red blood with urination.  He denies any pain.  Patient states that during his hospitalization, had great difficulty inserting his catheter.  Finally urology was called in.  Patient had catheter until he was discharged.  He states he did not notice any blood on the catheter when it was removed.  Patient has had no difficulty urinating, flank pain, dysuria, frequency or urgency.  He denies any other symptoms of anemia.  Past Medical History  Diagnosis Date  . Nonischemic cardiomyopathy     EF 25% by echo in 2012, catheterization 5 years ago in [place] reportedly showed no significant coronary disease  . Hypertension   . Cat scratch fever     removed mass on right side neck  . Cataracts, bilateral   . High cholesterol   . Osteoarthritis   . Kidney stones    Past Surgical History  Procedure Laterality Date  . Kidney stone surgery Right 1970's; 1983    "twice"  . Tonsillectomy and adenoidectomy  1940's  . Cystoscopy with urethral dilatation  10/23/2013  . Total hip arthroplasty Left 10/23/2013  . Inguinal hernia repair Right 1950's  . Circumcision  1940's  . Incision and drainage of wound Right 1980's    "cat scratch" (10/23/2013)  . Cardiac catheterization  1980's  . Total hip arthroplasty  Left 10/23/2013    Procedure: TOTAL HIP ARTHROPLASTY;  Surgeon: Valeria Batman, MD;  Location: [facility] OR;  Service: Orthopedics;  Laterality: Left;  . Cystoscopy with urethral dilatation N/A 10/23/2013    Procedure: CYSTOSCOPY WITH URETHRAL DILATATION;  Surgeon: Kathi Ludwig, MD;  Location: [facility] OR;  Service: Urology;  Laterality: N/A;   History reviewed. No pertinent family history. History  Substance Use Topics  . Smoking status: Former Smoker -- 1.00 packs/day for 15 years    Types: Cigarettes, Cigars  . Smokeless tobacco: Never Used     Comment: 10/23/2013 "quit smoking ~ 40 yr ago; have a cigar maybe q 4-5 months"  . Alcohol Use: 1.2 oz/week    2 Cans of beer per week     Comment: 10/23/2013 "maybe a 12 pack in 1 1/2 months"    Review of Systems Ten systems reviewed and are negative for acute change, except as noted in the HPI.   Allergies  Lisinopril  Home Medications   Current Outpatient Rx  Name  Route  Sig  Dispense  Refill  . carvedilol (COREG) 25 MG tablet   Oral   Take 25 mg by mouth 2 (two) times daily with a meal.         . cyanocobalamin 1000 MCG tablet   Oral   Take 2,000 mcg by mouth daily.         . furosemide (LASIX) 40 MG tablet   Oral   Take 40  mg by mouth 2 (two) times daily.         . hydrALAZINE (APRESOLINE) 10 MG tablet   Oral   Take 20 mg by mouth 2 (two) times daily.         . isosorbide dinitrate (ISOCHRON) 40 MG CR tablet   Oral   Take 40 mg by mouth 3 (three) times daily.         . methocarbamol (ROBAXIN) 500 MG tablet   Oral   Take 1 tablet (500 mg total) by mouth every 8 (eight) hours as needed for muscle spasms.   30 tablet   0   . Multiple Vitamin (MULTIVITAMIN WITH MINERALS) TABS tablet   Oral   Take 1 tablet by mouth daily.         . niacin (NIASPAN) 500 MG CR tablet   Oral   Take 500 mg by mouth at bedtime.         Marland Kitchen oxyCODONE (OXY IR/ROXICODONE) 5 MG immediate release tablet   Oral   Take 1-2  tablets (5-10 mg total) by mouth every 4 (four) hours as needed for moderate pain, severe pain or breakthrough pain.   90 tablet   0   . Rivaroxaban (XARELTO) 20 MG TABS tablet   Oral   Take 1 tablet (20 mg total) by mouth daily with supper.   30 tablet   0   . simvastatin (ZOCOR) 80 MG tablet   Oral   Take 40 mg by mouth every evening.         Marland Kitchen spironolactone (ALDACTONE) 25 MG tablet   Oral   Take 25 mg by mouth at bedtime.         . tamsulosin (FLOMAX) 0.4 MG CAPS capsule   Oral   Take 0.4 mg by mouth at bedtime.          BP 148/63  Pulse 90  Temp(Src) 98.8 F (37.1 C) (Oral)  Resp 18  SpO2 99% Physical Exam  Nursing note and vitals reviewed. Constitutional: He appears well-developed and well-nourished. No distress.  HENT:  Head: Normocephalic and atraumatic.  Eyes: Conjunctivae are normal. No scleral icterus.  Neck: Normal range of motion. Neck supple.  Cardiovascular: Normal rate, regular rhythm and normal heart sounds.   Pulmonary/Chest: Effort normal and breath sounds normal. No respiratory distress.  Abdominal: Soft. There is no tenderness.  Genitourinary:  Penis retracted with Larger clot at the meatus. NO active bleeding at this time.   Musculoskeletal: He exhibits no edema.  Neurological: He is alert.  Skin: Skin is warm and dry. He is not diaphoretic.  Psychiatric: His behavior is normal.    ED Course  Procedures (including critical care time) Labs Review Labs Reviewed  CBC - Abnormal; Notable for the following:    RBC 2.94 (*)    Hemoglobin 9.5 (*)    HCT 28.2 (*)    All other components within normal limits  BASIC METABOLIC PANEL - Abnormal; Notable for the following:    Glucose, Bld 140 (*)    GFR calc non Af Amer 59 (*)    GFR calc Af Amer 68 (*)    All other components within normal limits  PROTIME-INR - Abnormal; Notable for the following:    Prothrombin Time 20.2 (*)    INR 1.78 (*)    All other components within normal limits   URINALYSIS, ROUTINE W REFLEX MICROSCOPIC   Imaging Review No results found.  EKG Interpretation   None  MDM   1. Hematuria   . 11:36 AM Patient with gross, painless hematuria. Likely secondary to urethral trauma. His pt INR is slightly elevated. Hgb rising form previous by 1 gm Patient will be discharged home to follow up with Urology.     Arthor Captain, PA-C 10/30/13 [phone]

## 2013-10-30 NOTE — ED Notes (Signed)
ABD pad Changes. Est blood loss 10 cc,bright red, blood is clotting when exiting urethra. Pt denies pain.

## 2013-10-31 NOTE — ED Provider Notes (Signed)
Medical screening examination/treatment/procedure(s) were conducted as a shared visit with non-physician practitioner(s) or resident and myself. I personally evaluated the patient during the encounter and agree with the findings and plan unless otherwise indicated.  I have personally reviewed any xrays and/ or EKG's with the provider and I agree with interpretation.  Mild hematuria, pt on Xarelto, no lightheaded or syncope or cp. Pt feels okay otherwise. Mild blood at meatus. No retention. ABd soft/ NT, non distended. Strict fup with urology and pcp discussed.   Hematuria  Enid Skeens, MD 10/31/13 [phone]

## 2015-01-22 ENCOUNTER — Emergency Department (HOSPITAL_COMMUNITY): Payer: Non-veteran care

## 2015-01-22 ENCOUNTER — Other Ambulatory Visit: Payer: Self-pay

## 2015-01-22 ENCOUNTER — Emergency Department (HOSPITAL_COMMUNITY)
Admission: EM | Admit: 2015-01-22 | Discharge: 2015-01-22 | Disposition: A | Discharge status: Home / Self Care | Payer: Non-veteran care | Attending: Emergency Medicine | Admitting: Emergency Medicine

## 2015-01-22 ENCOUNTER — Encounter (HOSPITAL_COMMUNITY): Payer: Self-pay | Admitting: Family Medicine

## 2015-01-22 DIAGNOSIS — R0682 Tachypnea, not elsewhere classified: Secondary | ICD-10-CM | POA: Insufficient documentation

## 2015-01-22 DIAGNOSIS — M1990 Unspecified osteoarthritis, unspecified site: Secondary | ICD-10-CM | POA: Diagnosis not present

## 2015-01-22 DIAGNOSIS — Z87442 Personal history of urinary calculi: Secondary | ICD-10-CM | POA: Insufficient documentation

## 2015-01-22 DIAGNOSIS — Z87891 Personal history of nicotine dependence: Secondary | ICD-10-CM | POA: Insufficient documentation

## 2015-01-22 DIAGNOSIS — Z8669 Personal history of other diseases of the nervous system and sense organs: Secondary | ICD-10-CM | POA: Insufficient documentation

## 2015-01-22 DIAGNOSIS — Z79899 Other long term (current) drug therapy: Secondary | ICD-10-CM | POA: Diagnosis not present

## 2015-01-22 DIAGNOSIS — E119 Type 2 diabetes mellitus without complications: Secondary | ICD-10-CM | POA: Insufficient documentation

## 2015-01-22 DIAGNOSIS — Z8619 Personal history of other infectious and parasitic diseases: Secondary | ICD-10-CM | POA: Insufficient documentation

## 2015-01-22 DIAGNOSIS — Z7902 Long term (current) use of antithrombotics/antiplatelets: Secondary | ICD-10-CM | POA: Diagnosis not present

## 2015-01-22 DIAGNOSIS — E780 Pure hypercholesterolemia: Secondary | ICD-10-CM | POA: Diagnosis not present

## 2015-01-22 DIAGNOSIS — I10 Essential (primary) hypertension: Secondary | ICD-10-CM | POA: Diagnosis not present

## 2015-01-22 DIAGNOSIS — Z7982 Long term (current) use of aspirin: Secondary | ICD-10-CM | POA: Diagnosis not present

## 2015-01-22 DIAGNOSIS — Z9889 Other specified postprocedural states: Secondary | ICD-10-CM | POA: Diagnosis not present

## 2015-01-22 DIAGNOSIS — I5033 Acute on chronic diastolic (congestive) heart failure: Secondary | ICD-10-CM | POA: Insufficient documentation

## 2015-01-22 DIAGNOSIS — I509 Heart failure, unspecified: Secondary | ICD-10-CM

## 2015-01-22 DIAGNOSIS — R0602 Shortness of breath: Secondary | ICD-10-CM

## 2015-01-22 DIAGNOSIS — I5023 Acute on chronic systolic (congestive) heart failure: Secondary | ICD-10-CM

## 2015-01-22 HISTORY — DX: Paroxysmal atrial fibrillation: I48.0

## 2015-01-22 HISTORY — DX: Morbid (severe) obesity due to excess calories: E66.01

## 2015-01-22 HISTORY — DX: Type 2 diabetes mellitus without complications: E11.9

## 2015-01-22 LAB — BASIC METABOLIC PANEL
Anion gap: 9 (ref 5–15)
BUN: 12 mg/dL (ref 6–23)
CALCIUM: 9.1 mg/dL (ref 8.4–10.5)
CO2: 27 mmol/L (ref 19–32)
Chloride: 102 mmol/L (ref 96–112)
Creatinine, Ser: 1.18 mg/dL (ref 0.50–1.35)
GFR calc Af Amer: 66 mL/min — ABNORMAL LOW (ref >90)
GFR, EST NON AFRICAN AMERICAN: 57 mL/min — AB (ref >90)
GLUCOSE: 168 mg/dL — AB (ref 70–99)
Potassium: 3.9 mmol/L (ref 3.5–5.1)
Sodium: 138 mmol/L (ref 135–145)

## 2015-01-22 LAB — HEPATIC FUNCTION PANEL
ALBUMIN: 3.2 g/dL — AB (ref 3.5–5.2)
ALT: 17 U/L (ref 0–53)
AST: 24 U/L (ref 0–37)
Alkaline Phosphatase: 61 U/L (ref 39–117)
Bilirubin, Direct: <0.1 mg/dL (ref 0.0–0.5)
TOTAL PROTEIN: 7.4 g/dL (ref 6.0–8.3)
Total Bilirubin: 0.8 mg/dL (ref 0.3–1.2)

## 2015-01-22 LAB — CBC
HEMATOCRIT: 35.7 % — AB (ref 39.0–52.0)
HEMOGLOBIN: 11.7 g/dL — AB (ref 13.0–17.0)
MCH: 30.6 pg (ref 26.0–34.0)
MCHC: 32.8 g/dL (ref 30.0–36.0)
MCV: 93.5 fL (ref 78.0–100.0)
Platelets: 230 10*3/uL (ref 150–400)
RBC: 3.82 MIL/uL — ABNORMAL LOW (ref 4.22–5.81)
RDW: 13.9 % (ref 11.5–15.5)
WBC: 8 10*3/uL (ref 4.0–10.5)

## 2015-01-22 LAB — I-STAT TROPONIN, ED: Troponin i, poc: 0.03 ng/mL (ref 0.00–0.08)

## 2015-01-22 LAB — BRAIN NATRIURETIC PEPTIDE: B Natriuretic Peptide: 206.4 pg/mL — ABNORMAL HIGH (ref 0.0–100.0)

## 2015-01-22 MED ORDER — FUROSEMIDE 10 MG/ML IJ SOLN
40.0000 mg | Freq: Once | INTRAMUSCULAR | Status: AC
  Administered 2015-01-22: 40 mg via INTRAVENOUS
  Filled 2015-01-22: qty 4

## 2015-01-22 NOTE — ED Provider Notes (Signed)
6:03 PM cardiology has seen patient, they recommend discharge and f/u with primary cardiologist.  Recommend decreasing coreg dose until followup with cardiology.    Ethelda Chick, MD 01/22/15 [phone]

## 2015-01-22 NOTE — Discharge Instructions (Signed)
Return to the ED with any concerns including chest pain, difficulty breathing, fainting, or any other alarming symptoms  Cardiology recommends reducing your coreg dose to 12.5 mg bid for the time being- until you followup with your cardiologist

## 2015-01-22 NOTE — ED Notes (Signed)
Patient transported to X-ray 

## 2015-01-22 NOTE — Consult Note (Signed)
Patient ID: Ryan Macdonald MRN: [id number], DOB/AGE: [birth year]   Admit date: 01/22/2015  Primary Physician: [facility] Primary Cardiologist: [facility]  Pt. Profile:  81 year old male with known prior cardiac history, including NICM with an EF of 25%, who presented to ED with SOB on exertion.   Problem List  Past Medical History  Diagnosis Date  . Nonischemic cardiomyopathy     a. ? 2009 Cath in [place] - nl cors per pt;  b. 09/2013 Echo: EF 25-30%, sev diff HK.  Marland Kitchen Hypertension   . Cat scratch fever     removed mass on right side neck  . Cataracts, bilateral   . High cholesterol   . Osteoarthritis   . Kidney stones   . Diabetes mellitus without complication        PAF - post-op afib in 2014 - prev on xarelto  Past Surgical History  Procedure Laterality Date  . Kidney stone surgery Right 1970's; 1983    "twice"  . Tonsillectomy and adenoidectomy  1940's  . Cystoscopy with urethral dilatation  10/23/2013  . Total hip arthroplasty Left 10/23/2013  . Inguinal hernia repair Right 1950's  . Circumcision  1940's  . Incision and drainage of wound Right 1980's    "cat scratch" (10/23/2013)  . Cardiac catheterization  1980's  . Total hip arthroplasty Left 10/23/2013    Procedure: TOTAL HIP ARTHROPLASTY;  Surgeon: Valeria Batman, MD;  Location: [facility] OR;  Service: Orthopedics;  Laterality: Left;  . Cystoscopy with urethral dilatation N/A 10/23/2013    Procedure: CYSTOSCOPY WITH URETHRAL DILATATION;  Surgeon: Kathi Ludwig, MD;  Location: [facility] OR;  Service: Urology;  Laterality: N/A;     Allergies  Allergies  Allergen Reactions  . Lisinopril Swelling    HPI  81 year old male with the above past medical history, with known prior cardiac history of hypertension, DM, NICM,  EF of 25-30% shown in echo in 09/2013, and high cholesterol. He is a retired Public house manager and stays on top of his daily weights, monitoring his BP at home, and taking his medications. His normal  weight is between 258 to 261 lbs with his normal BP of 120s/80s. A few days ago he decided to stop taking his meds - doesn't give a reason.  He was in his usual state of health until yesterday morning when he started noting some DOE.  No orthopnea, edema, change in wt or abd girth.  This AM after he ate breakfast and took his morning medications, he felt SOB while walking in his house, felt like he was carrying some extra fluid in his legs. He called the [facility] to see if he could be seen there but they advised him to come to the local ED d/t his EF being so low, so his wife brought him in.    He denies any CP. When he presented to the ED his EKG revealed sinus with 1st deg avb and pvc's/pac's.  Troponin 0.03, BNP 206.4. Chest x-ray without cardiomegaly or chf. On admission, BP was stable at 133/86 with a heart rate of 95 and O2 sats 96% on room air.   He received IV lasix and has diuresed well thus far.  Home Medications  Prior to Admission medications   Medication Sig Start Date End Date Taking? Authorizing Provider  aspirin 81 MG tablet Take 81 mg by mouth daily.   Yes Historical Provider, MD  carvedilol (COREG) 25 MG tablet Take 25 mg  by mouth 2 (two) times daily with a meal.   Yes Historical Provider, MD  cyanocobalamin 1000 MCG tablet Take 2,000 mcg by mouth daily.   Yes Historical Provider, MD  furosemide (LASIX) 40 MG tablet Take 40 mg by mouth 2 (two) times daily.   Yes Historical Provider, MD  hydrALAZINE (APRESOLINE) 10 MG tablet Take 20 mg by mouth 2 (two) times daily.   Yes Historical Provider, MD  isosorbide dinitrate (ISOCHRON) 40 MG CR tablet Take 40 mg by mouth 3 (three) times daily.   Yes Historical Provider, MD  niacin 500 MG tablet Take 500 mg by mouth at bedtime as needed (Per dr orders).   Yes Historical Provider, MD  simvastatin (ZOCOR) 80 MG tablet Take 40 mg by mouth daily as needed (Per Dr orders).    Yes Historical Provider, MD  spironolactone (ALDACTONE) 25 MG tablet  Take 25 mg by mouth daily.    Yes Historical Provider, MD  tamsulosin (FLOMAX) 0.4 MG CAPS capsule Take 0.4 mg by mouth at bedtime.   Yes Historical Provider, MD  traMADol (ULTRAM) 50 MG tablet Take 50 mg by mouth every 6 (six) hours as needed.   Yes Historical Provider, MD  methocarbamol (ROBAXIN) 500 MG tablet Take 1 tablet (500 mg total) by mouth every 8 (eight) hours as needed for muscle spasms. Patient not taking: Reported on 01/22/2015 10/25/13   Jacqualine Code, PA-C  oxyCODONE (OXY IR/ROXICODONE) 5 MG immediate release tablet Take 1-2 tablets (5-10 mg total) by mouth every 4 (four) hours as needed for moderate pain, severe pain or breakthrough pain. Patient not taking: Reported on 01/22/2015 10/25/13   Jacqualine Code, PA-C  Rivaroxaban (XARELTO) 20 MG TABS tablet Take 1 tablet (20 mg total) by mouth daily with supper. Patient not taking: Reported on 01/22/2015 10/25/13   Jacqualine Code, PA-C   Family History  Family History  Problem Relation Age of Onset  . Other      negative for premature CAD   Social History  History   Social History  . Marital Status: Married    Spouse Name: N/A  . Number of Children: N/A  . Years of Education: N/A   Occupational History  . Not on file.   Social History Main Topics  . Smoking status: Former Smoker -- 1.00 packs/day for 15 years    Types: Cigarettes, Cigars  . Smokeless tobacco: Never Used     Comment: quit smoking ~ 40 yr ago  . Alcohol Use: No     Comment: rare beer.  . Drug Use: No  . Sexual Activity: Not on file   Other Topics Concern  . Not on file   Social History Narrative   Lives in [place] with wife.  Does not routinely exercise.     Review of Systems General:  No chills, fever, night sweats or weight changes.  Cardiovascular:  +++ dyspnea on exertion, edema. No chest pain, orthopnea, palpitations, paroxysmal nocturnal dyspnea. Dermatological: No rash, lesions/masses Respiratory: +++dyspnea on exertion. No  cough. Urologic: No hematuria, dysuria Abdominal:   No nausea, vomiting, diarrhea, bright red blood per rectum, melena, or hematemesis Neurologic:  No visual changes, wkns, changes in mental status. All other systems reviewed and are otherwise negative except as noted above.  Physical Exam  Blood pressure 135/104, pulse 91, temperature 98.4 F (36.9 C), resp. rate 20, weight 260 lb (117.935 kg), SpO2 97 %.  General: Pleasant, NAD Psych: Normal affect. Neuro: Alert and oriented X 3. Moves all extremities  spontaneously. HEENT: Normal  Neck: Supple without bruits or difficult to assess JVD 2/2 neck girth. Lungs:  Resp regular and unlabored, diminished breath sounds bilat bases. Heart: Irregular, no s3, s4, or murmurs. Abdomen: Soft, non-tender, non-distended, BS + x 4.  Extremities: +1 pitting to bilat lower extremities, nontender. No clubbing or cyanosis. DP/PT/Radials 2+ and equal bilaterally.   Labs  Troponin [facility] Care Test)  Recent Labs  01/22/15 1329  TROPIPOC 0.03    Lab Results  Component Value Date   WBC 8.0 01/22/2015   HGB 11.7* 01/22/2015   HCT 35.7* 01/22/2015   MCV 93.5 01/22/2015   PLT 230 01/22/2015    Recent Labs Lab 01/22/15 1244  NA 138  K 3.9  CL 102  CO2 27  BUN 12  CREATININE 1.18  CALCIUM 9.1  PROT 7.4  BILITOT 0.8  ALKPHOS 61  ALT 17  AST 24  GLUCOSE 168*    Radiology/Studies  Dg Chest 2 View  01/22/2015   CLINICAL DATA:  Shortness of breath for 2 days  EXAM: CHEST  2 VIEW  COMPARISON:  10/17/2013  FINDINGS: The heart size and mediastinal contours are within normal limits. Both lungs are clear. The visualized skeletal structures are unremarkable.  IMPRESSION: No active cardiopulmonary disease.   Electronically Signed   By: Alcide Clever M.D.   On: 01/22/2015 14:35   ECG  Rsr, 1st deg avb, pac's, pvc's, inflat twi - similar to older ecg's.  ASSESSMENT AND PLAN  1. Acute on chronic systolic chf/NICM:  Pt presented with a 2 day  h/o of DOE in the setting of medication noncompliance.  Wt has not changed @ home and he has not been having edema, orthopnea, pnd, or change in abd girth.  He's received IV lasix here in the ED with good output and clinical improvement.  Labs relatively unrevealing and in sinus on ecg.  Recommend d/c from ED with plan for early f/u with primary cardiologist in [place].  As BP's have been soft following IV diuresis, rec reducing coreg to 12.5 mg bid for the time being (f/u with primary cards).  Otw resume all previous home doses of meds including hydralazine, nitrate, spironolactone, and lasix.  2.  HTN:  Stable.  BP soft.  Reduce coreg to 12.5 bid as above.   Signed, Nicolasa Ducking, NP 01/22/2015, 3:33 PM

## 2015-01-22 NOTE — ED Notes (Signed)
Pt having SOB since last night. sts worsening over night. Pt hx of CHF. Pt having chest pain.

## 2015-01-22 NOTE — ED Notes (Signed)
Pt ambulated down 75-100 ft oxygen sats noted to be between 89-91% pt denies any abnormal sob or weakness. Pt returned to room and placed on monitor sats noted to increase to 96% RA at rest.

## 2015-01-22 NOTE — ED Provider Notes (Signed)
CSN: [id number]     Arrival date & time 01/22/15  1232 History   First MD Initiated Contact with Patient 01/22/15 1320     Chief Complaint  Patient presents with  . Shortness of Breath     (Consider location/radiation/quality/duration/timing/severity/associated sxs/prior Treatment) The history is provided by the patient.  Ryan Macdonald is a 81 y.o. male hx of HTN, nonischemic cardiomyopathy with EF 25%, A. fib on xarelto here presenting with shortness of breath. Shortness breath since last night. Also some leg swelling this morning. [facility] and has no stents in the heart. Has some vague chest pain that resolved.    Past Medical History  Diagnosis Date  . Nonischemic cardiomyopathy     EF 25% by echo in 2012, catheterization 5 years ago in [place] reportedly showed no significant coronary disease  . Hypertension   . Cat scratch fever     removed mass on right side neck  . Cataracts, bilateral   . High cholesterol   . Osteoarthritis   . Kidney stones   . Diabetes mellitus without complication    Past Surgical History  Procedure Laterality Date  . Kidney stone surgery Right 1970's; 1983    "twice"  . Tonsillectomy and adenoidectomy  1940's  . Cystoscopy with urethral dilatation  10/23/2013  . Total hip arthroplasty Left 10/23/2013  . Inguinal hernia repair Right 1950's  . Circumcision  1940's  . Incision and drainage of wound Right 1980's    "cat scratch" (10/23/2013)  . Cardiac catheterization  1980's  . Total hip arthroplasty Left 10/23/2013    Procedure: TOTAL HIP ARTHROPLASTY;  Surgeon: Valeria Batman, MD;  Location: [facility] OR;  Service: Orthopedics;  Laterality: Left;  . Cystoscopy with urethral dilatation N/A 10/23/2013    Procedure: CYSTOSCOPY WITH URETHRAL DILATATION;  Surgeon: Kathi Ludwig, MD;  Location: [facility] OR;  Service: Urology;  Laterality: N/A;   History reviewed. No pertinent family history. History   Substance Use Topics  . Smoking status: Former Smoker -- 1.00 packs/day for 15 years    Types: Cigarettes, Cigars  . Smokeless tobacco: Never Used     Comment: 10/23/2013 "quit smoking ~ 40 yr ago; have a cigar maybe q 4-5 months"  . Alcohol Use: 1.2 oz/week    2 Cans of beer per week     Comment: 10/23/2013 "maybe a 12 pack in 1 1/2 months"    Review of Systems  Respiratory: Positive for shortness of breath.   All other systems reviewed and are negative.     Allergies  Lisinopril  Home Medications   Prior to Admission medications   Medication Sig Start Date End Date Taking? Authorizing Provider  aspirin 81 MG tablet Take 81 mg by mouth daily.   Yes Historical Provider, MD  carvedilol (COREG) 25 MG tablet Take 25 mg by mouth 2 (two) times daily with a meal.   Yes Historical Provider, MD  cyanocobalamin 1000 MCG tablet Take 2,000 mcg by mouth daily.   Yes Historical Provider, MD  furosemide (LASIX) 40 MG tablet Take 40 mg by mouth 2 (two) times daily.   Yes Historical Provider, MD  hydrALAZINE (APRESOLINE) 10 MG tablet Take 20 mg by mouth 2 (two) times daily.   Yes Historical Provider, MD  isosorbide dinitrate (ISOCHRON) 40 MG CR tablet Take 40 mg by mouth 3 (three) times daily.   Yes Historical Provider, MD  niacin 500 MG tablet Take 500 mg  by mouth at bedtime as needed (Per dr orders).   Yes Historical Provider, MD  simvastatin (ZOCOR) 80 MG tablet Take 40 mg by mouth daily as needed (Per Dr orders).    Yes Historical Provider, MD  spironolactone (ALDACTONE) 25 MG tablet Take 25 mg by mouth daily.    Yes Historical Provider, MD  tamsulosin (FLOMAX) 0.4 MG CAPS capsule Take 0.4 mg by mouth at bedtime.   Yes Historical Provider, MD  traMADol (ULTRAM) 50 MG tablet Take 50 mg by mouth every 6 (six) hours as needed.   Yes Historical Provider, MD  methocarbamol (ROBAXIN) 500 MG tablet Take 1 tablet (500 mg total) by mouth every 8 (eight) hours as needed for muscle spasms. Patient not  taking: Reported on 01/22/2015 10/25/13   Jacqualine Code, PA-C  oxyCODONE (OXY IR/ROXICODONE) 5 MG immediate release tablet Take 1-2 tablets (5-10 mg total) by mouth every 4 (four) hours as needed for moderate pain, severe pain or breakthrough pain. Patient not taking: Reported on 01/22/2015 10/25/13   Jacqualine Code, PA-C  Rivaroxaban (XARELTO) 20 MG TABS tablet Take 1 tablet (20 mg total) by mouth daily with supper. Patient not taking: Reported on 01/22/2015 10/25/13   Jacqualine Code, PA-C   BP 133/86 mmHg  Pulse 95  Temp(Src) 98.4 F (36.9 C)  Resp 18  Wt 260 lb (117.935 kg)  SpO2 96% Physical Exam  Constitutional: He is oriented to person, place, and time.  Slightly tachypneic,   HENT:  Head: Normocephalic.  Mouth/Throat: Oropharynx is clear and moist.  Eyes: Conjunctivae are normal. Pupils are equal, round, and reactive to light.  Neck: Normal range of motion. Neck supple.  Cardiovascular: Normal rate, regular rhythm and normal heart sounds.   Pulmonary/Chest: Effort normal.  Crackles bilateral bases   Abdominal: Soft. Bowel sounds are normal. He exhibits no distension. There is no tenderness. There is no rebound and no guarding.  Musculoskeletal:  1+ edema bilaterally   Neurological: He is alert and oriented to person, place, and time. No cranial nerve deficit. Coordination normal.  Skin: Skin is warm and dry.  Psychiatric: He has a normal mood and affect. His behavior is normal. Judgment and thought content normal.  Nursing note and vitals reviewed.   ED Course  Procedures (including critical care time) Labs Review Labs Reviewed  CBC - Abnormal; Notable for the following:    RBC 3.82 (*)    Hemoglobin 11.7 (*)    HCT 35.7 (*)    All other components within normal limits  BASIC METABOLIC PANEL - Abnormal; Notable for the following:    Glucose, Bld 168 (*)    GFR calc non Af Amer 57 (*)    GFR calc Af Amer 66 (*)    All other components within normal limits  BRAIN  NATRIURETIC PEPTIDE  HEPATIC FUNCTION PANEL  I-STAT TROPOININ, ED    Imaging Review No results found.   EKG Interpretation   Date/Time:  Wednesday January 22 2015 13:37:34 EST Ventricular Rate:  87 PR Interval:    QRS Duration: 98 QT Interval:  353 QTC Calculation: 425 R Axis:   39 Text Interpretation:  Ectopic atrial rhythm Low voltage, precordial leads  Borderline repolarization abnormality No significant change since last  tracing Reconfirmed by Alli Jasmer  MD, Lavoris Canizales ([id number]) on 01/22/2015 1:47:24 PM      MDM   Final diagnoses:  SOB (shortness of breath)    Ryan Macdonald is a 81 y.o. male here with SOB. Likely CHF exacerbation. On  xarelto for afib so I doubt PE. Will get labs, BNP, CXR.   3:36 PM BNP slightly elevated. O2 89-91% on RA. Ordered lasix 40 mg IV. Will consult cardiology for eval.      Richardean Canal, MD 01/22/15 1537

## 2015-01-22 NOTE — ED Notes (Signed)
Cardiology at bedside.

## 2015-05-22 ENCOUNTER — Inpatient Hospital Stay (HOSPITAL_COMMUNITY)
Admission: EM | Admit: 2015-05-22 | Discharge: 2015-05-27 | DRG: 291 | Disposition: A | Discharge status: Skilled Nursing Facility | Payer: Medicare Other | Attending: Internal Medicine | Admitting: Internal Medicine

## 2015-05-22 ENCOUNTER — Emergency Department (HOSPITAL_COMMUNITY): Payer: Medicare Other

## 2015-05-22 ENCOUNTER — Encounter (HOSPITAL_COMMUNITY): Payer: Self-pay | Admitting: Emergency Medicine

## 2015-05-22 DIAGNOSIS — Z7901 Long term (current) use of anticoagulants: Secondary | ICD-10-CM | POA: Diagnosis not present

## 2015-05-22 DIAGNOSIS — T41295A Adverse effect of other general anesthetics, initial encounter: Secondary | ICD-10-CM | POA: Diagnosis present

## 2015-05-22 DIAGNOSIS — G9340 Encephalopathy, unspecified: Secondary | ICD-10-CM | POA: Diagnosis present

## 2015-05-22 DIAGNOSIS — Z7982 Long term (current) use of aspirin: Secondary | ICD-10-CM | POA: Diagnosis not present

## 2015-05-22 DIAGNOSIS — R0902 Hypoxemia: Secondary | ICD-10-CM | POA: Diagnosis not present

## 2015-05-22 DIAGNOSIS — E785 Hyperlipidemia, unspecified: Secondary | ICD-10-CM | POA: Diagnosis present

## 2015-05-22 DIAGNOSIS — E1165 Type 2 diabetes mellitus with hyperglycemia: Secondary | ICD-10-CM | POA: Diagnosis present

## 2015-05-22 DIAGNOSIS — M10479 Other secondary gout, unspecified ankle and foot: Secondary | ICD-10-CM | POA: Diagnosis not present

## 2015-05-22 DIAGNOSIS — Z87891 Personal history of nicotine dependence: Secondary | ICD-10-CM

## 2015-05-22 DIAGNOSIS — I10 Essential (primary) hypertension: Secondary | ICD-10-CM | POA: Diagnosis present

## 2015-05-22 DIAGNOSIS — J9600 Acute respiratory failure, unspecified whether with hypoxia or hypercapnia: Secondary | ICD-10-CM | POA: Insufficient documentation

## 2015-05-22 DIAGNOSIS — N179 Acute kidney failure, unspecified: Secondary | ICD-10-CM | POA: Diagnosis present

## 2015-05-22 DIAGNOSIS — E780 Pure hypercholesterolemia: Secondary | ICD-10-CM | POA: Diagnosis present

## 2015-05-22 DIAGNOSIS — I959 Hypotension, unspecified: Secondary | ICD-10-CM | POA: Diagnosis not present

## 2015-05-22 DIAGNOSIS — J9601 Acute respiratory failure with hypoxia: Secondary | ICD-10-CM | POA: Diagnosis present

## 2015-05-22 DIAGNOSIS — M1990 Unspecified osteoarthritis, unspecified site: Secondary | ICD-10-CM | POA: Diagnosis present

## 2015-05-22 DIAGNOSIS — I429 Cardiomyopathy, unspecified: Secondary | ICD-10-CM | POA: Diagnosis present

## 2015-05-22 DIAGNOSIS — I2699 Other pulmonary embolism without acute cor pulmonale: Secondary | ICD-10-CM | POA: Diagnosis not present

## 2015-05-22 DIAGNOSIS — Z6841 Body Mass Index (BMI) 40.0 and over, adult: Secondary | ICD-10-CM

## 2015-05-22 DIAGNOSIS — E669 Obesity, unspecified: Secondary | ICD-10-CM | POA: Diagnosis not present

## 2015-05-22 DIAGNOSIS — I5023 Acute on chronic systolic (congestive) heart failure: Principal | ICD-10-CM | POA: Diagnosis present

## 2015-05-22 DIAGNOSIS — J9602 Acute respiratory failure with hypercapnia: Secondary | ICD-10-CM | POA: Diagnosis present

## 2015-05-22 DIAGNOSIS — D649 Anemia, unspecified: Secondary | ICD-10-CM | POA: Diagnosis present

## 2015-05-22 DIAGNOSIS — N289 Disorder of kidney and ureter, unspecified: Secondary | ICD-10-CM | POA: Diagnosis not present

## 2015-05-22 DIAGNOSIS — Z96642 Presence of left artificial hip joint: Secondary | ICD-10-CM | POA: Diagnosis present

## 2015-05-22 DIAGNOSIS — I509 Heart failure, unspecified: Secondary | ICD-10-CM | POA: Diagnosis not present

## 2015-05-22 DIAGNOSIS — R092 Respiratory arrest: Secondary | ICD-10-CM | POA: Diagnosis present

## 2015-05-22 LAB — I-STAT CHEM 8, ED
BUN: 26 mg/dL — ABNORMAL HIGH (ref 6–20)
Calcium, Ion: 1.2 mmol/L (ref 1.13–1.30)
Chloride: 106 mmol/L (ref 101–111)
Creatinine, Ser: 1.3 mg/dL — ABNORMAL HIGH (ref 0.61–1.24)
Glucose, Bld: 252 mg/dL — ABNORMAL HIGH (ref 65–99)
HEMATOCRIT: 42 % (ref 39.0–52.0)
Hemoglobin: 14.3 g/dL (ref 13.0–17.0)
Potassium: 4.5 mmol/L (ref 3.5–5.1)
SODIUM: 140 mmol/L (ref 135–145)
TCO2: 21 mmol/L (ref 0–100)

## 2015-05-22 LAB — I-STAT ARTERIAL BLOOD GAS, ED
Acid-base deficit: 3 mmol/L — ABNORMAL HIGH (ref 0.0–2.0)
Bicarbonate: 24.7 mEq/L — ABNORMAL HIGH (ref 20.0–24.0)
O2 SAT: 100 %
PCO2 ART: 54.9 mmHg — AB (ref 35.0–45.0)
PH ART: 7.261 — AB (ref 7.350–7.450)
PO2 ART: 237 mmHg — AB (ref 80.0–100.0)
TCO2: 26 mmol/L (ref 0–100)

## 2015-05-22 LAB — CBC
HCT: 33.8 % — ABNORMAL LOW (ref 39.0–52.0)
HEMOGLOBIN: 11.2 g/dL — AB (ref 13.0–17.0)
MCH: 31.7 pg (ref 26.0–34.0)
MCHC: 33.1 g/dL (ref 30.0–36.0)
MCV: 95.8 fL (ref 78.0–100.0)
PLATELETS: 215 10*3/uL (ref 150–400)
RBC: 3.53 MIL/uL — ABNORMAL LOW (ref 4.22–5.81)
RDW: 13.8 % (ref 11.5–15.5)
WBC: 9.6 10*3/uL (ref 4.0–10.5)

## 2015-05-22 LAB — CBC WITH DIFFERENTIAL/PLATELET
BASOS ABS: 0 10*3/uL (ref 0.0–0.1)
Basophils Relative: 0 % (ref 0–1)
Eosinophils Absolute: 0.3 10*3/uL (ref 0.0–0.7)
Eosinophils Relative: 3 % (ref 0–5)
HCT: 38.3 % — ABNORMAL LOW (ref 39.0–52.0)
Hemoglobin: 12.5 g/dL — ABNORMAL LOW (ref 13.0–17.0)
LYMPHS PCT: 39 % (ref 12–46)
Lymphs Abs: 4.4 10*3/uL — ABNORMAL HIGH (ref 0.7–4.0)
MCH: 31.6 pg (ref 26.0–34.0)
MCHC: 32.6 g/dL (ref 30.0–36.0)
MCV: 97 fL (ref 78.0–100.0)
MONOS PCT: 8 % (ref 3–12)
Monocytes Absolute: 0.9 10*3/uL (ref 0.1–1.0)
NEUTROS ABS: 5.6 10*3/uL (ref 1.7–7.7)
Neutrophils Relative %: 50 % (ref 43–77)
Platelets: 251 10*3/uL (ref 150–400)
RBC: 3.95 MIL/uL — ABNORMAL LOW (ref 4.22–5.81)
RDW: 13.6 % (ref 11.5–15.5)
WBC: 11.1 10*3/uL — AB (ref 4.0–10.5)

## 2015-05-22 LAB — TYPE AND SCREEN
ABO/RH(D): O POS
Antibody Screen: NEGATIVE

## 2015-05-22 LAB — CREATININE, SERUM
Creatinine, Ser: 1.43 mg/dL — ABNORMAL HIGH (ref 0.61–1.24)
GFR calc Af Amer: 53 mL/min — ABNORMAL LOW (ref >60)
GFR calc non Af Amer: 45 mL/min — ABNORMAL LOW (ref >60)

## 2015-05-22 LAB — MRSA PCR SCREENING: MRSA by PCR: NEGATIVE

## 2015-05-22 LAB — COMPREHENSIVE METABOLIC PANEL
ALBUMIN: 3.2 g/dL — AB (ref 3.5–5.0)
ALK PHOS: 55 U/L (ref 38–126)
ALT: 24 U/L (ref 17–63)
ANION GAP: 11 (ref 5–15)
AST: 33 U/L (ref 15–41)
BUN: 19 mg/dL (ref 6–20)
CALCIUM: 9.2 mg/dL (ref 8.9–10.3)
CO2: 23 mmol/L (ref 22–32)
CREATININE: 1.5 mg/dL — AB (ref 0.61–1.24)
Chloride: 106 mmol/L (ref 101–111)
GFR calc non Af Amer: 43 mL/min — ABNORMAL LOW (ref >60)
GFR, EST AFRICAN AMERICAN: 50 mL/min — AB (ref >60)
GLUCOSE: 254 mg/dL — AB (ref 65–99)
Potassium: 4.4 mmol/L (ref 3.5–5.1)
Sodium: 140 mmol/L (ref 135–145)
TOTAL PROTEIN: 7.6 g/dL (ref 6.5–8.1)
Total Bilirubin: 0.6 mg/dL (ref 0.3–1.2)

## 2015-05-22 LAB — GLUCOSE, CAPILLARY: GLUCOSE-CAPILLARY: 125 mg/dL — AB (ref 65–99)

## 2015-05-22 LAB — TROPONIN I
TROPONIN I: 0.08 ng/mL — AB (ref <0.031)
Troponin I: 0.09 ng/mL — ABNORMAL HIGH (ref <0.031)

## 2015-05-22 LAB — I-STAT TROPONIN, ED: Troponin i, poc: 0.03 ng/mL (ref 0.00–0.08)

## 2015-05-22 LAB — PROTIME-INR
INR: 1.01 (ref 0.00–1.49)
PROTHROMBIN TIME: 13.5 s (ref 11.6–15.2)

## 2015-05-22 MED ORDER — SIMVASTATIN 40 MG PO TABS
40.0000 mg | ORAL_TABLET | Freq: Every day | ORAL | Status: DC
  Administered 2015-05-22 – 2015-05-23 (×2): 40 mg
  Filled 2015-05-22 (×3): qty 1

## 2015-05-22 MED ORDER — SODIUM CHLORIDE 0.9 % IV SOLN
25.0000 ug/h | INTRAVENOUS | Status: DC
  Administered 2015-05-22: 50 ug/h via INTRAVENOUS
  Administered 2015-05-24: 100 ug/h via INTRAVENOUS
  Filled 2015-05-22 (×2): qty 50

## 2015-05-22 MED ORDER — MIDAZOLAM HCL 5 MG/ML IJ SOLN
1.0000 mg/h | INTRAMUSCULAR | Status: DC
  Administered 2015-05-22 – 2015-05-24 (×3): 2 mg/h via INTRAVENOUS
  Filled 2015-05-22 (×3): qty 10

## 2015-05-22 MED ORDER — RIVAROXABAN 20 MG PO TABS
20.0000 mg | ORAL_TABLET | Freq: Every day | ORAL | Status: DC
  Administered 2015-05-22 – 2015-05-23 (×2): 20 mg
  Filled 2015-05-22 (×4): qty 1

## 2015-05-22 MED ORDER — FUROSEMIDE 10 MG/ML IJ SOLN
40.0000 mg | Freq: Three times a day (TID) | INTRAMUSCULAR | Status: AC
  Administered 2015-05-22 – 2015-05-23 (×3): 40 mg via INTRAVENOUS
  Filled 2015-05-22 (×2): qty 4

## 2015-05-22 MED ORDER — FUROSEMIDE 10 MG/ML IJ SOLN: 40.0000 mg | Freq: Once | INTRAMUSCULAR | Status: DC

## 2015-05-22 MED ORDER — CHLORHEXIDINE GLUCONATE 0.12 % MT SOLN
15.0000 mL | Freq: Two times a day (BID) | OROMUCOSAL | Status: DC
  Administered 2015-05-22 – 2015-05-23 (×3): 15 mL via OROMUCOSAL
  Filled 2015-05-22 (×3): qty 15

## 2015-05-22 MED ORDER — SODIUM CHLORIDE 0.9 % IV SOLN: 250.0000 mL | INTRAVENOUS | Status: DC | PRN

## 2015-05-22 MED ORDER — MIDAZOLAM HCL 2 MG/2ML IJ SOLN
2.0000 mg | Freq: Once | INTRAMUSCULAR | Status: AC
  Administered 2015-05-22: 2 mg via INTRAVENOUS

## 2015-05-22 MED ORDER — ASPIRIN 81 MG PO CHEW
81.0000 mg | CHEWABLE_TABLET | Freq: Every day | ORAL | Status: DC
  Administered 2015-05-22 – 2015-05-24 (×3): 81 mg
  Filled 2015-05-22 (×3): qty 1

## 2015-05-22 MED ORDER — PROPOFOL 1000 MG/100ML IV EMUL
5.0000 ug/kg/min | Freq: Once | INTRAVENOUS | Status: AC
  Administered 2015-05-22: 10 ug/kg/min via INTRAVENOUS

## 2015-05-22 MED ORDER — MIDAZOLAM HCL 2 MG/2ML IJ SOLN
INTRAMUSCULAR | Status: AC
Start: 2015-05-22 — End: 2015-05-22
  Filled 2015-05-22: qty 2

## 2015-05-22 MED ORDER — VITAL HIGH PROTEIN PO LIQD
1000.0000 mL | ORAL | Status: DC
  Administered 2015-05-22 (×3): 1000 mL
  Administered 2015-05-23 (×2)
  Administered 2015-05-23: 1000 mL
  Administered 2015-05-23: 10:00:00
  Filled 2015-05-22 (×3): qty 1000

## 2015-05-22 MED ORDER — FENTANYL CITRATE (PF) 100 MCG/2ML IJ SOLN
25.0000 ug | Freq: Once | INTRAMUSCULAR | Status: DC
  Filled 2015-05-22: qty 2

## 2015-05-22 MED ORDER — INSULIN ASPART 100 UNIT/ML ~~LOC~~ SOLN
2.0000 [IU] | SUBCUTANEOUS | Status: DC
  Administered 2015-05-22: 2 [IU] via SUBCUTANEOUS
  Administered 2015-05-23: 4 [IU] via SUBCUTANEOUS
  Administered 2015-05-23 (×2): 2 [IU] via SUBCUTANEOUS
  Administered 2015-05-23 – 2015-05-24 (×2): 4 [IU] via SUBCUTANEOUS
  Administered 2015-05-24: 2 [IU] via SUBCUTANEOUS
  Administered 2015-05-24 (×2): 4 [IU] via SUBCUTANEOUS
  Administered 2015-05-25: 2 [IU] via SUBCUTANEOUS

## 2015-05-22 MED ORDER — RIVAROXABAN 20 MG PO TABS: 20.0000 mg | ORAL_TABLET | Freq: Every day | ORAL | Status: DC

## 2015-05-22 MED ORDER — MIDAZOLAM HCL 2 MG/2ML IJ SOLN
1.0000 mg | Freq: Once | INTRAMUSCULAR | Status: DC
Start: 2015-05-22 — End: 2015-05-24
  Filled 2015-05-22: qty 2

## 2015-05-22 MED ORDER — FENTANYL CITRATE (PF) 100 MCG/2ML IJ SOLN
INTRAMUSCULAR | Status: AC
  Administered 2015-05-22: 100 ug
  Filled 2015-05-22: qty 2

## 2015-05-22 MED ORDER — FENTANYL CITRATE (PF) 100 MCG/2ML IJ SOLN
INTRAMUSCULAR | Status: AC
  Filled 2015-05-22: qty 2

## 2015-05-22 MED ORDER — HEPARIN SODIUM (PORCINE) 5000 UNIT/ML IJ SOLN: 5000.0000 [IU] | Freq: Three times a day (TID) | INTRAMUSCULAR | Status: DC

## 2015-05-22 MED ORDER — ASPIRIN 300 MG RE SUPP: 300.0000 mg | RECTAL | Status: DC

## 2015-05-22 MED ORDER — FAMOTIDINE 40 MG/5ML PO SUSR
20.0000 mg | Freq: Two times a day (BID) | ORAL | Status: DC
  Administered 2015-05-22 – 2015-05-24 (×4): 20 mg
  Filled 2015-05-22 (×7): qty 2.5

## 2015-05-22 MED ORDER — ASPIRIN 81 MG PO CHEW: 324.0000 mg | CHEWABLE_TABLET | ORAL | Status: DC

## 2015-05-22 MED ORDER — FENTANYL CITRATE (PF) 100 MCG/2ML IJ SOLN
100.0000 ug | Freq: Once | INTRAMUSCULAR | Status: AC
  Administered 2015-05-22: 100 ug via INTRAVENOUS

## 2015-05-22 MED ORDER — CETYLPYRIDINIUM CHLORIDE 0.05 % MT LIQD
7.0000 mL | Freq: Four times a day (QID) | OROMUCOSAL | Status: DC
  Administered 2015-05-23 – 2015-05-27 (×18): 7 mL via OROMUCOSAL

## 2015-05-22 MED ORDER — PROPOFOL 1000 MG/100ML IV EMUL
INTRAVENOUS | Status: AC
  Filled 2015-05-22: qty 100

## 2015-05-22 NOTE — H&P (Signed)
PULMONARY / CRITICAL CARE MEDICINE   Name: Ryan Macdonald MRN: [id number] DOB: Mar 31, 1934    ADMISSION DATE:  05/22/2015 CONSULTATION DATE:  6/23  REFERRING MD :  Madilyn Hook  CHIEF COMPLAINT:  Unresponsive/hypoxia  INITIAL PRESENTATION: Ryan Macdonald is a 81 yo male admitted to ED on 6/23. He was apparently home alone when he became SOB and called EMS. He became hypoxic (sats in 60s), bradycardic, and unresponsive in route to ED and upon arrival was immediately orotracheally intubated. He has a history of HFrEF noted to be 25-30% on 2014 echo. Chest radiograph concerning for pulmonary edema. Admitted to PCCM svc.   STUDIES:    SIGNIFICANT EVENTS: 6/23: Unresponsive and acute respiratory failure requiring intubation  HISTORY OF PRESENT ILLNESS:  Ryan Macdonald is a 81 yo male admitted to the ED via EMS 6/23 with a previous medical history significant for HFrEF with ejection fraction of 25-30% estimated in 2014. No family is available and pt is mechanically ventilated what we know at this point is Ryan Macdonald became SOB and notified EMS. In route to [facility] ED patient became increasingly hypoxic, bradycardic and unresponsive. He was immediately intubated on arrival to ED. PCCM was consulted to manage this patients care. On arrival propofol drip was turned off and patient woke up within 5 minutes and was able to follow commands. However, due to vent dyssynchrony he was sedated for comfort. His chest radiograph reveals diffuse patchy infiltrates concerning for pulmonary edema. We continue to work up patient for myocardial ischemia and treat apparent volume overload in the setting of his reduced cardiac function.   PAST MEDICAL HISTORY :   has a past medical history of Nonischemic cardiomyopathy; Hypertension; Cat scratch fever; Cataracts, bilateral; High cholesterol; Osteoarthritis; Kidney stones; Diabetes mellitus without complication; Morbid obesity; and PAF (paroxysmal atrial fibrillation).  has past surgical history that includes Kidney stone surgery (Right, 1970's; 1983); Tonsillectomy and adenoidectomy (1940's); Cystoscopy with urethral dilatation (10/23/2013); Total hip arthroplasty (Left, 10/23/2013); Inguinal hernia repair (Right, 1950's); Circumcision (1940's); Incision and drainage of wound (Right, 1980's); Cardiac catheterization (1980's); Total hip arthroplasty (Left, 10/23/2013); and Cystoscopy with urethral dilatation (N/A, 10/23/2013). Prior to Admission medications   Medication Sig Start Date End Date Taking? Authorizing Provider  aspirin 81 MG tablet Take 81 mg by mouth daily.    Historical Provider, MD  carvedilol (COREG) 25 MG tablet Take 25 mg by mouth 2 (two) times daily with a meal.    Historical Provider, MD  cyanocobalamin 1000 MCG tablet Take 2,000 mcg by mouth daily.    Historical Provider, MD  furosemide (LASIX) 40 MG tablet Take 40 mg by mouth 2 (two) times daily.    Historical Provider, MD  hydrALAZINE (APRESOLINE) 10 MG tablet Take 20 mg by mouth 2 (two) times daily.    Historical Provider, MD  isosorbide dinitrate (ISOCHRON) 40 MG CR tablet Take 40 mg by mouth 3 (three) times daily.    Historical Provider, MD  methocarbamol (ROBAXIN) 500 MG tablet Take 1 tablet (500 mg total) by mouth every 8 (eight) hours as needed for muscle spasms. Patient not taking: Reported on 01/22/2015 10/25/13   Jetty Peeks, PA-C  niacin 500 MG tablet Take 500 mg by mouth at bedtime as needed (Per dr orders).    Historical Provider, MD  oxyCODONE (OXY IR/ROXICODONE) 5 MG immediate release tablet Take 1-2 tablets (5-10 mg total) by mouth every 4 (four) hours as needed for moderate pain, severe pain or breakthrough pain. Patient not taking: Reported on  01/22/2015 10/25/13   Jetty Peeks, PA-C  Rivaroxaban (XARELTO) 20 MG TABS tablet Take 1 tablet (20 mg total) by mouth daily with supper. Patient not taking: Reported on 01/22/2015 10/25/13   Oris Drone Petrarca, PA-C  simvastatin  (ZOCOR) 80 MG tablet Take 40 mg by mouth daily as needed (Per Dr orders).     Historical Provider, MD  spironolactone (ALDACTONE) 25 MG tablet Take 25 mg by mouth daily.     Historical Provider, MD  tamsulosin (FLOMAX) 0.4 MG CAPS capsule Take 0.4 mg by mouth at bedtime.    Historical Provider, MD  traMADol (ULTRAM) 50 MG tablet Take 50 mg by mouth every 6 (six) hours as needed.    Historical Provider, MD   Allergies  Allergen Reactions  . Lisinopril Swelling    FAMILY HISTORY:  indicated that his mother is deceased. He indicated that his father is deceased. He indicated that both of his sisters are deceased. He indicated that his brother is deceased.  SOCIAL HISTORY:  reports that he has quit smoking. His smoking use included Cigarettes and Cigars. He has a 15 pack-year smoking history. He has never used smokeless tobacco. He reports that he does not drink alcohol or use illicit drugs.  REVIEW OF SYSTEMS:  Unable to obtain.    VITAL SIGNS: Pulse Rate:  [83-107] 85 (06/23 1345) Resp:  [16-31] 17 (06/23 1345) BP: (127-160)/(81-102) 127/81 mmHg (06/23 1336) SpO2:  [88 %-98 %] 98 % (06/23 1345) FiO2 (%):  [100 %] 100 % (06/23 1322) Weight:  [121.5 kg (267 lb 13.7 oz)] 121.5 kg (267 lb 13.7 oz) (06/23 1300) HEMODYNAMICS:   VENTILATOR SETTINGS: Vent Mode:  [-] PRVC FiO2 (%):  [100 %] 100 % Set Rate:  [16 bmp] 16 bmp Vt Set:  [600 mL] 600 mL PEEP:  [5 cmH20] 5 cmH20 Plateau Pressure:  [27 cmH20] 27 cmH20 INTAKE / OUTPUT: No intake or output data in the 24 hours ending 05/22/15 1407  PHYSICAL EXAMINATION: General: African american male lying in intubated on stretcher Neuro:  Off sedation will wake up and follow commands. Moves all ex HEENT:  PERRL Ryan Macdonald Cardiovascular: RRR, no rubs gallops or murmurs Lungs:  Course BS bilaterally anteriorly Abdomen:  Soft, nontender, nondistened Musculoskeletal:  intact Skin:  Warm and dry  LABS:  CBC  Recent Labs Lab 05/22/15 1327  05/22/15 1354  WBC 11.1*  --   HGB 12.5* 14.3  HCT 38.3* 42.0  PLT 251  --    Coag's No results for input(s): APTT, INR in the last 168 hours. BMET  Recent Labs Lab 05/22/15 1327 05/22/15 1354  NA 140 140  K 4.4 4.5  CL 106 106  CO2 23  --   BUN 19 26*  CREATININE 1.50* 1.30*  GLUCOSE 254* 252*   Electrolytes  Recent Labs Lab 05/22/15 1327  CALCIUM 9.2   Sepsis Markers No results for input(s): LATICACIDVEN, PROCALCITON, O2SATVEN in the last 168 hours. ABG No results for input(s): PHART, PCO2ART, PO2ART in the last 168 hours. Liver Enzymes  Recent Labs Lab 05/22/15 1327  AST 33  ALT 24  ALKPHOS 55  BILITOT 0.6  ALBUMIN 3.2*   Cardiac Enzymes No results for input(s): TROPONINI, PROBNP in the last 168 hours. Glucose No results for input(s): GLUCAP in the last 168 hours.  Imaging No results found.   ASSESSMENT / PLAN:  PULMONARY OETT>>>6/23 A: Acute hypoxic and hypercarbic respiratory failure in setting of pulmonary edema/decomepsated HF P:   -Full vent  support -ABG now -CXR in AM -diuresis -PAD protocol   CARDIOVASCULAR CVL A: Acute decompensated systolic heart failure R/O MI  H/o afib   P:  -Tele -12 lead now- completed -Cycle CE -Consider echo;  will defer for now - cont xarelto   RENAL A:   AKI P:   -Diuresis as BP and SCr tolerate -Strict I&O -KVO fluids -monitor bmet  GASTROINTESTINAL A:  No acute issue P:   -NPO for now -Place OG -PPI for SUP  HEMATOLOGIC A:   Anemia  P:  -CBC in am -restart home xerelto  INFECTIOUS A:  No acute issue P:   -Monitor fever trend  ENDOCRINE A:  Hyperglycemia P:   -SSI per protocol -Q 4 hour blood glucose checks  NEUROLOGIC A:   Acute encephalopathy- likely due to hypoxia in route- now follows commands P:   -PAD protocol RASS goal: -2    FAMILY  - Updates: Family called to find patient location. On [facility].  - Inter-disciplinary family meet or  Palliative Care meeting due by:      TODAY'S SUMMARY: Ryan Macdonald is a 81 yo male who was delivered to ED via EMS and en route became increasingly hypoxic, bradycardic, and unresponsive. He was intubated for hypoxic respiratory failure in the ED. He is now responsive on the vent and able to follow commands. Sedated for comfort on vent. He has a history for HFrEF and cxr is concerning for pulmonary edema. We will treat with diuresis and r/o ischemia.    Simonne Martinet ACNP-BC [facility] Pulmonary/Critical Care Pager # [phone] OR # [phone] if no answer  05/22/2015, 2:07 PM   STAFF NOTE: I, Rory Percy, MD FACP have personally reviewed patient's available data, including medical history, events of note, physical examination and test results as part of my evaluation. I have discussed with resident/NP and other care providers such as pharmacist, RN and RRT. In addition, I personally evaluated patient and elicited key findings of: Now waking up agitated frmo vent in ED, re sedate with fent, crackles on examination, lasix start to neg balance goals, ABg reviewed, increase MV , repeat abg in am, r/o with trop, thus far neg, ecg unchanged frmo baseline, chem in am The patient is critically ill with multiple organ systems failure and requires high complexity decision making for assessment and support, frequent evaluation and titration of therapies, application of advanced monitoring technologies and extensive interpretation of multiple databases.   Critical Care Time devoted to patient care services described in this note is 30 Minutes. This time reflects time of care of this signee: Rory Percy, MD FACP. This critical care time does not reflect procedure time, or teaching time or supervisory time of PA/NP/Med student/Med Resident etc but could involve care discussion time. Rest per NP/medical resident whose note is outlined above and that I agree with   Mcarthur Rossetti. Tyson Alias, MD, FACP Pgr:  [phone] [facility] 05/22/2015 5:41 PM

## 2015-05-22 NOTE — ED Notes (Signed)
intensivist at bedside.

## 2015-05-22 NOTE — ED Notes (Signed)
Attempted temp foley size 16 fr. Attempt unsuccessful.

## 2015-05-22 NOTE — ED Notes (Signed)
Home, sob, wheezing, underlying rales, rhonchs, unable to tolerate albuterol, diaphoretic.  Went to CPAP, pt kept getting anxious, bagged after brady, desatted 60s, broguht up to 80s with bagging. Hx of chf.

## 2015-05-22 NOTE — ED Provider Notes (Signed)
CSN: [id number]     Arrival date & time 05/22/15  1317 History   First MD Initiated Contact with Patient 05/22/15 1320     Chief Complaint  Patient presents with  . Respiratory Arrest     The history is provided by the EMS personnel. No language interpreter was used.   Ryan Macdonald presents for evaluation of shortness of breath. Level V caveat due to respiratory distress. Hx provided by EMS. Patient called EMS for shortness of breath, at home alone. Per report he had increased shortness of breath, wheezing and rales bilaterally. Albuterol was provided and he became diaphoretic with increased anxiety. CPAP was then attempted but the patient became bradycardic and less responsive and oxygen sats dropped to the 60s.  Sats improved to 80s with BVM.    Past Medical History  Diagnosis Date  . Nonischemic cardiomyopathy     a. ? 2009 Cath in [place] - nl cors per pt;  b. 09/2013 Echo: EF 25-30%, sev diff HK.  Marland Kitchen Hypertension   . Cat scratch fever     removed mass on right side neck  . Cataracts, bilateral   . High cholesterol   . Osteoarthritis   . Kidney stones   . Diabetes mellitus without complication   . Morbid obesity   . PAF (paroxysmal atrial fibrillation)     a. post-op hip in 2014 - prev on xarelto.   Past Surgical History  Procedure Laterality Date  . Kidney stone surgery Right 1970's; 1983    "twice"  . Tonsillectomy and adenoidectomy  1940's  . Cystoscopy with urethral dilatation  10/23/2013  . Total hip arthroplasty Left 10/23/2013  . Inguinal hernia repair Right 1950's  . Circumcision  1940's  . Incision and drainage of wound Right 1980's    "cat scratch" (10/23/2013)  . Cardiac catheterization  1980's  . Total hip arthroplasty Left 10/23/2013    Procedure: TOTAL HIP ARTHROPLASTY;  Surgeon: Valeria Batman, MD;  Location: [facility] OR;  Service: Orthopedics;  Laterality: Left;  . Cystoscopy with urethral dilatation N/A 10/23/2013    Procedure: CYSTOSCOPY WITH URETHRAL  DILATATION;  Surgeon: Kathi Ludwig, MD;  Location[facility] OR;  Service: Urology;  Laterality: N/A;   Family History  Problem Relation Age of Onset  . Other      negative for premature CAD   History  Substance Use Topics  . Smoking status: Former Smoker -- 1.00 packs/day for 15 years    Types: Cigarettes, Cigars  . Smokeless tobacco: Never Used     Comment: quit smoking ~ 40 yr ago  . Alcohol Use: No     Comment: rare beer.    Review of Systems  Unable to perform ROS     Allergies  Lisinopril  Home Medications   Prior to Admission medications   Medication Sig Start Date End Date Taking? Authorizing Provider  aspirin 81 MG tablet Take 81 mg by mouth daily.    Historical Provider, MD  carvedilol (COREG) 25 MG tablet Take 25 mg by mouth 2 (two) times daily with a meal.    Historical Provider, MD  cyanocobalamin 1000 MCG tablet Take 2,000 mcg by mouth daily.    Historical Provider, MD  furosemide (LASIX) 40 MG tablet Take 40 mg by mouth 2 (two) times daily.    Historical Provider, MD  hydrALAZINE (APRESOLINE) 10 MG tablet Take 20 mg by mouth 2 (two) times daily.    Historical Provider, MD  isosorbide dinitrate (ISOCHRON) 40 MG  CR tablet Take 40 mg by mouth 3 (three) times daily.    Historical Provider, MD  methocarbamol (ROBAXIN) 500 MG tablet Take 1 tablet (500 mg total) by mouth every 8 (eight) hours as needed for muscle spasms. Patient not taking: Reported on 01/22/2015 10/25/13   Jetty Peeks, PA-C  niacin 500 MG tablet Take 500 mg by mouth at bedtime as needed (Per dr orders).    Historical Provider, MD  oxyCODONE (OXY IR/ROXICODONE) 5 MG immediate release tablet Take 1-2 tablets (5-10 mg total) by mouth every 4 (four) hours as needed for moderate pain, severe pain or breakthrough pain. Patient not taking: Reported on 01/22/2015 10/25/13   Jetty Peeks, PA-C  Rivaroxaban (XARELTO) 20 MG TABS tablet Take 1 tablet (20 mg total) by mouth daily with supper. Patient not  taking: Reported on 01/22/2015 10/25/13   Oris Drone Petrarca, PA-C  simvastatin (ZOCOR) 80 MG tablet Take 40 mg by mouth daily as needed (Per Dr orders).     Historical Provider, MD  spironolactone (ALDACTONE) 25 MG tablet Take 25 mg by mouth daily.     Historical Provider, MD  tamsulosin (FLOMAX) 0.4 MG CAPS capsule Take 0.4 mg by mouth at bedtime.    Historical Provider, MD  traMADol (ULTRAM) 50 MG tablet Take 50 mg by mouth every 6 (six) hours as needed.    Historical Provider, MD   BP 107/77 mmHg  Pulse 73  Temp(Src) 97.5 F (36.4 C) (Axillary)  Resp 18  Ht  (1.803 m)  Wt 267 lb 13.7 oz (121.5 kg)  BMI 37.38 kg/m2  SpO2 97% Physical Exam  Constitutional: He appears well-developed and well-nourished. He appears distressed.  HENT:  Head: Normocephalic and atraumatic.  Cardiovascular: Normal rate and regular rhythm.   No murmur heard. Pulmonary/Chest: He is in respiratory distress.  Decreased air movement bilaterally with bibasilar rales  Abdominal: Soft. There is no tenderness. There is no rebound and no guarding.  Musculoskeletal: He exhibits no tenderness.  Neurological:  Lethargic and minimally responsive, opens eyes spontaneously, moves all extremities  Skin: Skin is warm. He is diaphoretic.  Psychiatric:  Unable to assess  Nursing note and vitals reviewed.   ED Course  Procedures (including critical care time) INTUBATION Performed by: Tilden Fossa  Required items: required blood products, implants, devices, and special equipment available Patient identity confirmed: provided demographic data and hospital-assigned identification number Time out: Immediately prior to procedure a "time out" was called to verify the correct patient, procedure, equipment, support staff and site/side marked as required.  Indications: respiratory failure  Intubation method: Glidescope Laryngoscopy   Preoxygenation: BVM  Sedatives: Etomidate Paralytic: Succinylcholine  Tube  Size: 7-5 cuffed  Post-procedure assessment: chest rise and ETCO2 monitor Breath sounds: equal and absent over the epigastrium Tube secured with: ETT holder Chest x-ray interpreted by radiologist and me.  Chest x-ray findings: ETT 1.5 cm above carina, recommend retraction.   Patient tolerated the procedure well with no immediate complications.  CRITICAL CARE Performed by: Tilden Fossa   Total critical care time: 30 minutes  Critical care time was exclusive of separately billable procedures and treating other patients.  Critical care was necessary to treat or prevent imminent or life-threatening deterioration.  Critical care was time spent personally by me on the following activities: development of treatment plan with patient and/or surrogate as well as nursing, discussions with consultants, evaluation of patient's response to treatment, examination of patient, obtaining history from patient or surrogate, ordering and performing treatments  and interventions, ordering and review of laboratory studies, ordering and review of radiographic studies, pulse oximetry and re-evaluation of patient's condition.   Labs Review Labs Reviewed  COMPREHENSIVE METABOLIC PANEL - Abnormal; Notable for the following:    Glucose, Bld 254 (*)    Creatinine, Ser 1.50 (*)    Albumin 3.2 (*)    GFR calc non Af Amer 43 (*)    GFR calc Af Amer 50 (*)    All other components within normal limits  CBC WITH DIFFERENTIAL/PLATELET - Abnormal; Notable for the following:    WBC 11.1 (*)    RBC 3.95 (*)    Hemoglobin 12.5 (*)    HCT 38.3 (*)    Lymphs Abs 4.4 (*)    All other components within normal limits  I-STAT ARTERIAL BLOOD GAS, ED - Abnormal; Notable for the following:    pH, Arterial 7.261 (*)    pCO2 arterial 54.9 (*)    pO2, Arterial 237.0 (*)    Bicarbonate 24.7 (*)    Acid-base deficit 3.0 (*)    All other components within normal limits  I-STAT CHEM 8, ED - Abnormal; Notable for the  following:    BUN 26 (*)    Creatinine, Ser 1.30 (*)    Glucose, Bld 252 (*)    All other components within normal limits  MRSA PCR SCREENING  PROTIME-INR  CBC  CREATININE, SERUM  BLOOD GAS, ARTERIAL  TROPONIN I  TROPONIN I  TROPONIN I  I-STAT TROPOININ, ED  TYPE AND SCREEN    Imaging Review Dg Chest Portable 1 View  05/22/2015   CLINICAL DATA:  Intubation.  Shortness of breath.  EXAM: PORTABLE CHEST - 1 VIEW  COMPARISON:  01/22/2015  FINDINGS: An endotracheal tube terminates approximately 1.5 cm above the carina. Cardiac silhouette appears mildly enlarged. There is mild pulmonary vascular congestion. There is abnormal airspace opacity throughout much of the left lung. No pleural effusion or pneumothorax is identified. No acute osseous abnormality is seen.  IMPRESSION: 1. Endotracheal tube approximately 1.5 cm above the carina. Consider slight retraction. 2. Left lung airspace opacities, possibly representing pneumonia.   Electronically Signed   By: Sebastian Ache   On: 05/22/2015 14:09     EKG Interpretation   Date/Time:  Thursday May 22 2015 13:26:46 EDT Ventricular Rate:  112 PR Interval:    QRS Duration: 99 QT Interval:  437 QTC Calculation: 597 R Axis:   67 Text Interpretation:  Junctional tachycardia Borderline T abnormalities,  lateral leads Prolonged QT interval Confirmed by Lincoln Brigham [phone]) on  05/22/2015 1:45:31 PM      MDM   Final diagnoses:  Acute respiratory failure, unspecified whether with hypoxia or hypercapnia    Patient with history of COPD and CHF here for acute respiratory failure. Patient in respiratory distress and respiratory failure on initial emergency Department evaluation. RSI was performed per procedure night. History and presentation is consistent with acute respiratory failure with CHF exacerbation. Discussed with intensivist regarding admission for further resuscitation. Lasix ordered for volume overload but the patient ddeveloped hypotension  with propofol. Lasix held and propofol held until patient's blood pressure improved.  Tilden Fossa, MD 05/22/15 [phone]

## 2015-05-22 NOTE — Care Management Note (Signed)
Case Management Note  Patient Details  Name: Alvis Luba MRN: [id number] Date of Birth: 08-01-1934  Subjective/Objective:     Adm w resp distress, vent               Action/Plan:lives at home , pcp listed as [facility]   Expected Discharge Date:                  Expected Discharge Plan:     In-House Referral:     Discharge planning Services     Post Acute Care Choice:    Choice offered to:     DME Arranged:    DME Agency:     HH Arranged:    HH Agency:     Status of Service:     Medicare Important Message Given:    Date Medicare IM Given:    Medicare IM give by:    Date Additional Medicare IM Given:    Additional Medicare Important Message give by:     If discussed at Long Length of Stay Meetings, dates discussed:    Additional Comments: ur review done.  Hanley Hays, RN 05/22/2015, 3:57 PM

## 2015-05-22 NOTE — ED Notes (Signed)
Attempted temp foley size 14 fr. Attempt unsuccessful

## 2015-05-23 ENCOUNTER — Inpatient Hospital Stay (HOSPITAL_COMMUNITY): Payer: Medicare Other

## 2015-05-23 LAB — CBC
HEMATOCRIT: 34.2 % — AB (ref 39.0–52.0)
Hemoglobin: 11.4 g/dL — ABNORMAL LOW (ref 13.0–17.0)
MCH: 31.8 pg (ref 26.0–34.0)
MCHC: 33.3 g/dL (ref 30.0–36.0)
MCV: 95.5 fL (ref 78.0–100.0)
Platelets: 229 10*3/uL (ref 150–400)
RBC: 3.58 MIL/uL — AB (ref 4.22–5.81)
RDW: 13.5 % (ref 11.5–15.5)
WBC: 9.7 10*3/uL (ref 4.0–10.5)

## 2015-05-23 LAB — BASIC METABOLIC PANEL
ANION GAP: 13 (ref 5–15)
BUN: 19 mg/dL (ref 6–20)
CALCIUM: 9.5 mg/dL (ref 8.9–10.3)
CHLORIDE: 103 mmol/L (ref 101–111)
CO2: 25 mmol/L (ref 22–32)
CREATININE: 1.23 mg/dL (ref 0.61–1.24)
GFR calc non Af Amer: 54 mL/min — ABNORMAL LOW (ref >60)
Glucose, Bld: 127 mg/dL — ABNORMAL HIGH (ref 65–99)
POTASSIUM: 3.5 mmol/L (ref 3.5–5.1)
Sodium: 141 mmol/L (ref 135–145)

## 2015-05-23 LAB — TROPONIN I: TROPONIN I: 0.09 ng/mL — AB (ref <0.031)

## 2015-05-23 LAB — GLUCOSE, CAPILLARY
GLUCOSE-CAPILLARY: 102 mg/dL — AB (ref 65–99)
GLUCOSE-CAPILLARY: 146 mg/dL — AB (ref 65–99)
GLUCOSE-CAPILLARY: 87 mg/dL (ref 65–99)
Glucose-Capillary: 138 mg/dL — ABNORMAL HIGH (ref 65–99)
Glucose-Capillary: 164 mg/dL — ABNORMAL HIGH (ref 65–99)
Glucose-Capillary: 158 mg/dL — ABNORMAL HIGH (ref 65–99)
Glucose-Capillary: 108 mg/dL — ABNORMAL HIGH (ref 65–99)

## 2015-05-23 LAB — PHOSPHORUS: PHOSPHORUS: 2.2 mg/dL — AB (ref 2.5–4.6)

## 2015-05-23 LAB — MAGNESIUM: Magnesium: 1.6 mg/dL — ABNORMAL LOW (ref 1.7–2.4)

## 2015-05-23 LAB — PROCALCITONIN: Procalcitonin: 0.14 ng/mL

## 2015-05-23 MED ORDER — FUROSEMIDE 10 MG/ML IJ SOLN
40.0000 mg | Freq: Three times a day (TID) | INTRAMUSCULAR | Status: AC
Start: 2015-05-23 — End: 2015-05-24
  Administered 2015-05-23 – 2015-05-24 (×3): 40 mg via INTRAVENOUS
  Filled 2015-05-23 (×3): qty 4

## 2015-05-23 MED ORDER — AMPICILLIN-SULBACTAM SODIUM 3 (2-1) G IJ SOLR
3.0000 g | Freq: Four times a day (QID) | INTRAMUSCULAR | Status: DC
  Administered 2015-05-23 – 2015-05-24 (×3): 3 g via INTRAVENOUS
  Filled 2015-05-23 (×5): qty 3

## 2015-05-23 MED ORDER — MAGNESIUM SULFATE 2 GM/50ML IV SOLN
2.0000 g | Freq: Once | INTRAVENOUS | Status: AC
  Administered 2015-05-23: 2 g via INTRAVENOUS
  Filled 2015-05-23: qty 50

## 2015-05-23 MED ORDER — POTASSIUM CHLORIDE 20 MEQ/15ML (10%) PO SOLN
20.0000 meq | ORAL | Status: AC
  Administered 2015-05-23 (×2): 20 meq
  Filled 2015-05-23 (×2): qty 15

## 2015-05-23 MED ORDER — VITAL HIGH PROTEIN PO LIQD
1000.0000 mL | ORAL | Status: DC
  Administered 2015-05-23: 1000 mL
  Filled 2015-05-23 (×4): qty 1000

## 2015-05-23 MED ORDER — SODIUM CHLORIDE 0.9 % IV SOLN
10.0000 mmol | Freq: Once | INTRAVENOUS | Status: AC
Start: 2015-05-23 — End: 2015-05-23
  Administered 2015-05-23: 10 mmol via INTRAVENOUS
  Filled 2015-05-23: qty 3.33

## 2015-05-23 NOTE — Progress Notes (Signed)
Pt temp 1600 was 100.3 axillary. Temperature has trended upwards since admission. Elink notified. No new orders at this time. Will continue to monitor.

## 2015-05-23 NOTE — Progress Notes (Signed)
PULMONARY / CRITICAL CARE MEDICINE   Name: Ryan Macdonald MRN: [id number] DOB: 1934/10/25    ADMISSION DATE:  05/22/2015 CONSULTATION DATE:  6/23  REFERRING MD :  Madilyn Hook  CHIEF COMPLAINT:  Unresponsive/hypoxia  INITIAL PRESENTATION: Ryan Macdonald is a 81 yo male admitted to ED on 6/23. He was apparently home alone when he became SOB and called EMS. He became hypoxic (sats in 60s), bradycardic, and unresponsive in route to ED and upon arrival was immediately orotracheally intubated. He has a history of HFrEF noted to be 25-30% on 2014 echo. Chest radiograph concerning for pulmonary edema. Admitted to PCCM svc.   STUDIES:   SIGNIFICANT EVENTS: 6/23: Unresponsive and acute respiratory failure requiring intubation  HISTORY OF PRESENT ILLNESS:  Ryan Macdonald is a 81 yo male admitted to the ED via EMS 6/23 with a previous medical history significant for HFrEF with ejection fraction of 25-30% estimated in 2014. No family is available and pt is mechanically ventilated what we know at this point is Mr. Vermeer became SOB and notified EMS. In route to [facility] ED patient became increasingly hypoxic, bradycardic and unresponsive. He was immediately intubated on arrival to ED. PCCM was consulted to manage this patients care. On arrival propofol drip was turned off and patient woke up within 5 minutes and was able to follow commands. However, due to vent dyssynchrony he was sedated for comfort. His chest radiograph reveals diffuse patchy infiltrates concerning for pulmonary edema. We continue to work up patient for myocardial ischemia and treat apparent volume overload in the setting of his reduced cardiac function.   INTERVAL EVENTS:  Diuresing  Awake on low dose sedation   VITAL SIGNS: Temp:  [97.5 F (36.4 C)-98.2 F (36.8 C)] 98.2 F (36.8 C) (06/24 0748) Pulse Rate:  [66-107] 89 (06/24 1112) Resp:  [15-31] 24 (06/24 1100) BP: (71-160)/(20-102) 96/62 mmHg (06/24 1100) SpO2:  [88 %-100 %]  96 % (06/24 1112) FiO2 (%):  [50 %-100 %] 50 % (06/24 1112) Weight:  [119.3 kg (263 lb 0.1 oz)-122.2 kg (269 lb 6.4 oz)] 119.3 kg (263 lb 0.1 oz) (06/24 0500) HEMODYNAMICS:   VENTILATOR SETTINGS: Vent Mode:  [-] PSV;CPAP FiO2 (%):  [50 %-100 %] 50 % Set Rate:  [16 bmp-18 bmp] 18 bmp Vt Set:  [600 mL] 600 mL PEEP:  [5 cmH20] 5 cmH20 Pressure Support:  [5 cmH20] 5 cmH20 Plateau Pressure:  [24 cmH20-29 cmH20] 24 cmH20 INTAKE / OUTPUT:  Intake/Output Summary (Last 24 hours) at 05/23/15 1243 Last data filed at 05/23/15 1030  Gross per 24 hour  Intake 1031.39 ml  Output   3165 ml  Net -2133.61 ml    PHYSICAL EXAMINATION: General: awake, intubated, NAD Neuro:  will wake up and follow commands. Moves all ex HEENT:  PERRL [place]/AT Cardiovascular: RRR, no rubs gallops or murmurs Lungs:  Course BS bilaterally anteriorly Abdomen:  Soft, nontender, nondistened Musculoskeletal:  intact Skin:  Warm and dry  LABS:  CBC  Recent Labs Lab 05/22/15 1327 05/22/15 1354 05/22/15 1640 05/23/15 0240  WBC 11.1*  --  9.6 9.7  HGB 12.5* 14.3 11.2* 11.4*  HCT 38.3* 42.0 33.8* 34.2*  PLT 251  --  215 229   Coag's  Recent Labs Lab 05/22/15 1401  INR 1.01   BMET  Recent Labs Lab 05/22/15 1327 05/22/15 1354 05/22/15 1640 05/23/15 0240  NA 140 140  --  141  K 4.4 4.5  --  3.5  CL 106 106  --  103  CO2 23  --   --  25  BUN 19 26*  --  19  CREATININE 1.50* 1.30* 1.43* 1.23  GLUCOSE 254* 252*  --  127*   Electrolytes  Recent Labs Lab 05/22/15 1327 05/23/15 0240  CALCIUM 9.2 9.5  MG  --  1.6*  PHOS  --  2.2*   Sepsis Markers No results for input(s): LATICACIDVEN, PROCALCITON, O2SATVEN in the last 168 hours. ABG  Recent Labs Lab 05/22/15 1456  PHART 7.261*  PCO2ART 54.9*  PO2ART 237.0*   Liver Enzymes  Recent Labs Lab 05/22/15 1327  AST 33  ALT 24  ALKPHOS 55  BILITOT 0.6  ALBUMIN 3.2*   Cardiac Enzymes  Recent Labs Lab 05/22/15 1640 05/22/15 2055  05/23/15 0240  TROPONINI 0.08* 0.09* 0.09*   Glucose  Recent Labs Lab 05/22/15 1646 05/22/15 2001 05/23/15 0048 05/23/15 0409 05/23/15 0750  GLUCAP 125* 87 102* 138* 164*    Imaging Dg Chest Port 1 View  05/23/2015   CLINICAL DATA:  Intubation.  EXAM: PORTABLE CHEST - 1 VIEW  COMPARISON:  None.  FINDINGS: Endotracheal tube tip 2.3 cm above the carina. Stable cardiomegaly with normal pulmonary vascularity . Left lower lobe infiltrate consistent pneumonia again noted. No pleural effusion or pneumothorax.  IMPRESSION: 1. Endotracheal tube and NG tube in good anatomic position. 2. Persistent left lung infiltrate consistent pneumonia. 3. Stable cardiomegaly   Electronically Signed   By: Maisie Fus  Register   On: 05/23/2015 07:25   Dg Chest Portable 1 View  05/22/2015   CLINICAL DATA:  Intubation.  Shortness of breath.  EXAM: PORTABLE CHEST - 1 VIEW  COMPARISON:  01/22/2015  FINDINGS: An endotracheal tube terminates approximately 1.5 cm above the carina. Cardiac silhouette appears mildly enlarged. There is mild pulmonary vascular congestion. There is abnormal airspace opacity throughout much of the left lung. No pleural effusion or pneumothorax is identified. No acute osseous abnormality is seen.  IMPRESSION: 1. Endotracheal tube approximately 1.5 cm above the carina. Consider slight retraction. 2. Left lung airspace opacities, possibly representing pneumonia.   Electronically Signed   By: Sebastian Ache   On: 05/22/2015 14:09     ASSESSMENT / PLAN:  PULMONARY OETT 6/23 >>  A: Acute hypoxic and hypercarbic respiratory failure in setting of pulmonary edema/decomepsated HF P:   -Full vent support -follow CXR -diuresis -PAD protocol   CARDIOVASCULAR CVL A: Acute decompensated systolic heart failure R/O MI  H/o afib   P:  -Tele -Cycle CE -Consider echo;  will defer for now -cont xarelto   RENAL A:   AKI P:   -Diuresis as BP and SCr tolerate -Strict I&O -KVO fluids -monitor  bmet  GASTROINTESTINAL A:  No acute issue P:   -NPO for now -Place OG -PPI for SUP  HEMATOLOGIC A:   Anemia  P:  -CBC in am -restart home xerelto  INFECTIOUS A:  Low suspicion for CAP, suspect CHF  P:   -Monitor fever trend, clinical changes, CXR -off abx for now  ENDOCRINE A:  Hyperglycemia P:   -SSI per protocol -Q 4 hour blood glucose checks  NEUROLOGIC A:   Acute encephalopathy- likely due to hypoxia in route- now follows commands P:   -PAD protocol RASS goal: -1 to -2    FAMILY  - Updates:  Spoke with pt's wife and son 6/24  - Inter-disciplinary family meet or Palliative Care meeting due by:  6/30   TODAY'S SUMMARY: Ryan Macdonald is a 81 yo male who was  delivered to ED via EMS and en route became increasingly hypoxic, bradycardic, and unresponsive. He was intubated for hypoxic respiratory failure in the ED.  He has a history for HFrEF and cxr is shows pulmonary edema. We will treat with diuresis, goal extubate as soon as 6/25   Independent CC time 40 minutes  Levy Pupa, MD, PhD 05/23/2015, 12:56 PM [facility] [phone] or if no answer [phone]

## 2015-05-23 NOTE — Progress Notes (Signed)
eLink Physician-Brief Progress Note Patient Name: Ryan Macdonald DOB: 1934/04/03 MRN: [id number]   Date of Service  05/23/2015  HPI/Events of Note  Pt admitted with respiratory failure, altered mental status, and required intubation.  ?of CHF vs PNA on CXR.  He has LLL infiltrate and now has fever >> more concerning for aspiration PNA.   eICU Interventions  Will add unasyn, check blood culture and procalcitonin.      Intervention Category Major Interventions: Other:  Saladin Petrelli 05/23/2015, 6:02 PM

## 2015-05-23 NOTE — Progress Notes (Signed)
[facility] ADULT ICU REPLACEMENT PROTOCOL FOR AM LAB REPLACEMENT ONLY  The patient does apply for the [facility] Adult ICU Electrolyte Replacment Protocol based on the criteria listed below:   1. Is GFR >/= 40 ml/min? Yes.    Patient's GFR today is 54 2. Is urine output >/= 0.5 ml/kg/hr for the last 6 hours? Yes.   Patient's UOP is 1.5 ml/kg/hr 3. Is BUN < 60 mg/dL? Yes.    Patient's BUN today is 19 4. Abnormal electrolyte(s): K3.5,Phos2.2,Mg1.6 5. Ordered repletion with: per protocol 6. If a panic level lab has been reported, has the CCM MD in charge been notified? Yes.  .   Physician:  E Deterding,MD  Melrose Nakayama 05/23/2015 6:24 AM

## 2015-05-23 NOTE — Progress Notes (Signed)
ANTIBIOTIC CONSULT NOTE - INITIAL  Pharmacy Consult for unasyn Indication: pneumonia  Allergies  Allergen Reactions  . Lisinopril Swelling    Patient Measurements: Height: 5\' 7"  (170.2 cm) Weight: 263 lb 0.1 oz (119.3 kg) IBW/kg (Calculated) : 66.1 Adjusted Body Weight:   Vital Signs: Temp: 100.3 F (37.9 C) (06/24 1600) Temp Source: Axillary (06/24 1600) BP: 108/63 mmHg (06/24 1600) Pulse Rate: 95 (06/24 1600) Intake/Output from previous day: 06/23 0701 - 06/24 0700 In: 558.4 [I.V.:119.9; NG/GT:438.5] Out: 2490 [Urine:2490] Intake/Output from this shift: Total I/O In: 950.8 [I.V.:420.8; NG/GT:530] Out: 1725 [Urine:1725]  Labs:  Recent Labs  05/22/15 1327 05/22/15 1354 05/22/15 1640 05/23/15 0240  WBC 11.1*  --  9.6 9.7  HGB 12.5* 14.3 11.2* 11.4*  PLT 251  --  215 229  CREATININE 1.50* 1.30* 1.43* 1.23   Estimated Creatinine Clearance: 61.2 mL/min (by C-G formula based on Cr of 1.23). No results for input(s): VANCOTROUGH, VANCOPEAK, VANCORANDOM, GENTTROUGH, GENTPEAK, GENTRANDOM, TOBRATROUGH, TOBRAPEAK, TOBRARND, AMIKACINPEAK, AMIKACINTROU, AMIKACIN in the last 72 hours.   Microbiology: Recent Results (from the past 720 hour(s))  MRSA PCR Screening     Status: None   Collection Time: 05/22/15  4:02 PM  Result Value Ref Range Status   MRSA by PCR NEGATIVE NEGATIVE Final    Comment:        The GeneXpert MRSA Assay (FDA approved for NASAL specimens only), is one component of a comprehensive MRSA colonization surveillance program. It is not intended to diagnose MRSA infection nor to guide or monitor treatment for MRSA infections.     Medical History: Past Medical History  Diagnosis Date  . Nonischemic cardiomyopathy     a. ? 2009 Cath in [place] - nl cors per pt;  b. 09/2013 Echo: EF 25-30%, sev diff HK.  Marland Kitchen Hypertension   . Cat scratch fever     removed mass on right side neck  . Cataracts, bilateral   . High cholesterol   . Osteoarthritis   .  Kidney stones   . Diabetes mellitus without complication   . Morbid obesity   . PAF (paroxysmal atrial fibrillation)     a. post-op hip in 2014 - prev on xarelto.    Medications:  Scheduled:  . antiseptic oral rinse  7 mL Mouth Rinse QID  . aspirin  81 mg Per Tube Daily  . chlorhexidine  15 mL Mouth Rinse BID  . famotidine  20 mg Per Tube BID  . fentaNYL (SUBLIMAZE) injection  25 mcg Intravenous Once  . furosemide  40 mg Intravenous 3 times per day  . insulin aspart  2-6 Units Subcutaneous 6 times per day  . midazolam  1 mg Intravenous Once  . rivaroxaban  20 mg Per Tube Q supper  . simvastatin  40 mg Per Tube q1800   Infusions:  . feeding supplement (VITAL HIGH PROTEIN) 1,000 mL (05/23/15 1700)  . fentaNYL infusion INTRAVENOUS 25 mcg/hr (05/23/15 1700)  . midazolam (VERSED) infusion 2 mg/hr (05/23/15 1700)   Assessment: 81 yo who was admitted for resp failure. Currently on full vent support now. Worrying for asp PNA so adding unasyn tonight. Scr has trended down to 1.23.   Plan:   Unasyn 3g IV q6 F/u with scr  Ulyses Southward, PharmD Pager: [phone] 05/23/2015 6:07 PM

## 2015-05-23 NOTE — Progress Notes (Signed)
Initial Nutrition Assessment  DOCUMENTATION CODES:  Morbid obesity  INTERVENTION:  Tube feeding: Increase rate of Vital High protein via OGT by 10 ml/hr until goal of 65 ml/hr is met. Vital High Protein@ 65 ml/hr will provide 1560 kcal (13 kcal/kg), 137 grams of protein and 1310 ml of water.   NUTRITION DIAGNOSIS:  Inadequate oral intake related to inability to eat as evidenced by NPO status.  GOAL:  Provide needs based on ASPEN/SCCM guidelines  MONITOR:  Vent status, TF tolerance, Weight trends, Labs  REASON FOR ASSESSMENT:  Consult Enteral/tube feeding initiation and management  ASSESSMENT: 81 yo male admitted to ED on 6/23. He was apparently home alone when he became SOB. He became hypoxic (sats in 60s), bradycardic, and unresponsive in route to ED and upon arrival was immediately orotracheally intubated.   Per wife at bedside, pt was eating well PTA and has been maintaining his weight. Pt appears well-nourished. Vital high protein infusing at 40 ml/hr via OGT at time of visit. Per nursing notes, pt is tolerating with 0 ml residual this AM.  Labs: low hemoglobin, low magnesium, low phosphorus  Patient is currently intubated on ventilator support MV: 9.4 L/min Temp (24hrs), Avg:97.7 F (36.5 C), Min:97.5 F (36.4 C), Max:98.2 F (36.8 C)  Propofol: none   Height:  Ht Readings from Last 1 Encounters:  05/22/15 5' 7" (1.702 m)    Weight:  Wt Readings from Last 1 Encounters:  05/23/15 263 lb 0.1 oz (119.3 kg)    Ideal Body Weight:  67.3 kg  Wt Readings from Last 10 Encounters:  05/23/15 263 lb 0.1 oz (119.3 kg)  01/22/15 267 lb 12.8 oz (121.473 kg)  10/23/13 261 lb 14.5 oz (118.8 kg)  10/17/13 262 lb (118.842 kg)    BMI:  Body mass index is 41.18 kg/(m^2).  Estimated Nutritional Needs:  Kcal:  9528-4132  Protein:  135-150 grams  Fluid:  2.8-3 L/day  Skin:  Reviewed, no issues  Diet Order:  Diet NPO time specified  EDUCATION NEEDS:  No  education needs identified at this time   Intake/Output Summary (Last 24 hours) at 05/23/15 1035 Last data filed at 05/23/15 1030  Gross per 24 hour  Intake 1031.39 ml  Output   3165 ml  Net -2133.61 ml    Last BM:  PTA  Pryor Ochoa RD, LDN Inpatient Clinical Dietitian Pager: [phone] After Hours Pager: [phone]

## 2015-05-24 ENCOUNTER — Inpatient Hospital Stay (HOSPITAL_COMMUNITY): Payer: Medicare Other

## 2015-05-24 DIAGNOSIS — J9600 Acute respiratory failure, unspecified whether with hypoxia or hypercapnia: Secondary | ICD-10-CM | POA: Insufficient documentation

## 2015-05-24 LAB — GLUCOSE, CAPILLARY
GLUCOSE-CAPILLARY: 166 mg/dL — AB (ref 65–99)
GLUCOSE-CAPILLARY: 142 mg/dL — AB (ref 65–99)
Glucose-Capillary: 153 mg/dL — ABNORMAL HIGH (ref 65–99)
Glucose-Capillary: 157 mg/dL — ABNORMAL HIGH (ref 65–99)

## 2015-05-24 LAB — BASIC METABOLIC PANEL
ANION GAP: 13 (ref 5–15)
BUN: 26 mg/dL — AB (ref 6–20)
CO2: 27 mmol/L (ref 22–32)
CREATININE: 1.5 mg/dL — AB (ref 0.61–1.24)
Calcium: 9.6 mg/dL (ref 8.9–10.3)
Chloride: 102 mmol/L (ref 101–111)
GFR calc non Af Amer: 43 mL/min — ABNORMAL LOW (ref >60)
GFR, EST AFRICAN AMERICAN: 50 mL/min — AB (ref >60)
Glucose, Bld: 131 mg/dL — ABNORMAL HIGH (ref 65–99)
Potassium: 3.7 mmol/L (ref 3.5–5.1)
Sodium: 142 mmol/L (ref 135–145)

## 2015-05-24 LAB — PHOSPHORUS: PHOSPHORUS: 2.5 mg/dL (ref 2.5–4.6)

## 2015-05-24 LAB — PROCALCITONIN: PROCALCITONIN: 0.16 ng/mL

## 2015-05-24 LAB — MAGNESIUM: Magnesium: 1.7 mg/dL (ref 1.7–2.4)

## 2015-05-24 MED ORDER — POTASSIUM CHLORIDE 20 MEQ/15ML (10%) PO SOLN
20.0000 meq | ORAL | Status: AC
  Administered 2015-05-24 (×2): 20 meq
  Filled 2015-05-24 (×2): qty 15

## 2015-05-24 MED ORDER — SIMVASTATIN 40 MG PO TABS
40.0000 mg | ORAL_TABLET | Freq: Every day | ORAL | Status: DC
  Administered 2015-05-24 – 2015-05-26 (×3): 40 mg via ORAL
  Filled 2015-05-24 (×4): qty 1

## 2015-05-24 MED ORDER — MAGNESIUM SULFATE 2 GM/50ML IV SOLN
2.0000 g | Freq: Once | INTRAVENOUS | Status: AC
  Administered 2015-05-24: 2 g via INTRAVENOUS
  Filled 2015-05-24: qty 50

## 2015-05-24 MED ORDER — FAMOTIDINE 40 MG/5ML PO SUSR
20.0000 mg | Freq: Two times a day (BID) | ORAL | Status: DC
  Administered 2015-05-24: 20 mg via ORAL
  Filled 2015-05-24 (×3): qty 2.5

## 2015-05-24 MED ORDER — ASPIRIN 81 MG PO CHEW
81.0000 mg | CHEWABLE_TABLET | Freq: Every day | ORAL | Status: DC
  Filled 2015-05-24: qty 1

## 2015-05-24 NOTE — Progress Notes (Signed)
[facility] ADULT ICU REPLACEMENT PROTOCOL FOR AM LAB REPLACEMENT ONLY  The patient does apply for the [facility] Adult ICU Electrolyte Replacment Protocol based on the criteria listed below:   1. Is GFR >/= 40 ml/min? Yes.    Patient's GFR today is 43 2. Is urine output >/= 0.5 ml/kg/hr for the last 6 hours? Yes.   Patient's UOP is 1.5 ml/kg/hr 3. Is BUN < 60 mg/dL? Yes.    Patient's BUN today is 26 4. Abnormal electrolyte(s): K3.7,Mg1.7 5. Ordered repletion with: per protocol 6. If a panic level lab has been reported, has the CCM MD in charge been notified? Yes.  .   Physician:  Thea Gist 05/24/2015 5:49 AM

## 2015-05-24 NOTE — Procedures (Signed)
Extubation Procedure Note  Patient Details:   Name: Ryan Macdonald DOB: January 12, 1934 MRN: [id number]   Airway Documentation:  Airway 7.5 mm (Active)  Secured at (cm) 24 cm 05/24/2015  7:14 AM  Measured From Lips 05/24/2015  7:14 AM  Secured Location Left 05/24/2015  7:14 AM  Secured By [employer] 05/24/2015  7:14 AM  Tube Holder Repositioned Yes 05/24/2015  7:14 AM  Cuff Pressure (cm H2O) 26 cm H2O 05/23/2015 11:20 PM  Site Condition Dry 05/24/2015  7:14 AM    Evaluation  O2 sats: stable throughout Complications: No apparent complications Patient did tolerate procedure well. Bilateral Breath Sounds: Clear Suctioning: Airway Yes. Pt placed on 5L [place].  Sats remaining around 96%  Jacquelyn Antony, [place] 05/24/2015, 10:19 AM 2

## 2015-05-24 NOTE — Progress Notes (Addendum)
PULMONARY / CRITICAL CARE MEDICINE   Name: Harce Volden MRN: [id number] DOB: 1933/12/03    ADMISSION DATE:  05/22/2015 CONSULTATION DATE:  6/23  REFERRING MD :  Madilyn Hook  CHIEF COMPLAINT:  Unresponsive/hypoxia  INITIAL PRESENTATION: Ryan Macdonald is a 81 yo male admitted to ED on 6/23. He was apparently home alone when he became SOB and called EMS. He became hypoxic (sats in 60s), bradycardic, and unresponsive in route to ED and upon arrival was immediately orotracheally intubated. He has a history of HFrEF noted to be 25-30% on 2014 echo. Chest radiograph concerning for pulmonary edema. Admitted to PCCM svc.   STUDIES:   SIGNIFICANT EVENTS: 6/23: Unresponsive and acute respiratory failure requiring intubation 6/24 febrile abx started   INTERVAL EVENTS: weaning well   VITAL SIGNS: Temp:  [98 F (36.7 C)-100.3 F (37.9 C)] 98.2 F (36.8 C) (06/25 0714) Pulse Rate:  [42-100] 85 (06/25 0900) Resp:  [17-27] 18 (06/25 0900) BP: (86-119)/(44-94) 110/79 mmHg (06/25 0900) SpO2:  [88 %-100 %] 92 % (06/25 0900) FiO2 (%):  [50 %] 50 % (06/25 0900) Weight:  [117.5 kg (259 lb 0.7 oz)] 117.5 kg (259 lb 0.7 oz) (06/25 0349) HEMODYNAMICS:   VENTILATOR SETTINGS: Vent Mode:  [-] PSV;CPAP FiO2 (%):  [50 %] 50 % Set Rate:  [18 bmp] 18 bmp Vt Set:  [600 mL] 600 mL PEEP:  [5 cmH20] 5 cmH20 Pressure Support:  [5 cmH20-10 cmH20] 10 cmH20 Plateau Pressure:  [20 cmH20-23 cmH20] 22 cmH20 INTAKE / OUTPUT:  Intake/Output Summary (Last 24 hours) at 05/24/15 0944 Last data filed at 05/24/15 0800  Gross per 24 hour  Intake   2104 ml  Output   3775 ml  Net  -1671 ml    PHYSICAL EXAMINATION: General: awake, intubated, NAD Neuro:  will wake up and follow commands. Moves all ex HEENT:  PERRL [place]/AT Cardiovascular: RRR, no rubs gallops or murmurs Lungs:  Course BS bilaterally anteriorly Abdomen:  Soft, nontender, nondistened Musculoskeletal:  intact Skin:  Warm and  dry  LABS:  CBC  Recent Labs Lab 05/22/15 1327 05/22/15 1354 05/22/15 1640 05/23/15 0240  WBC 11.1*  --  9.6 9.7  HGB 12.5* 14.3 11.2* 11.4*  HCT 38.3* 42.0 33.8* 34.2*  PLT 251  --  215 229   Coag's  Recent Labs Lab 05/22/15 1401  INR 1.01   BMET  Recent Labs Lab 05/22/15 1327 05/22/15 1354 05/22/15 1640 05/23/15 0240 05/24/15 0235  NA 140 140  --  141 142  K 4.4 4.5  --  3.5 3.7  CL 106 106  --  103 102  CO2 23  --   --  25 27  BUN 19 26*  --  19 26*  CREATININE 1.50* 1.30* 1.43* 1.23 1.50*  GLUCOSE 254* 252*  --  127* 131*   Electrolytes  Recent Labs Lab 05/22/15 1327 05/23/15 0240 05/24/15 0235  CALCIUM 9.2 9.5 9.6  MG  --  1.6* 1.7  PHOS  --  2.2* 2.5   Sepsis Markers  Recent Labs Lab 05/23/15 1944 05/24/15 0235  PROCALCITON 0.14 0.16   ABG  Recent Labs Lab 05/22/15 1456  PHART 7.261*  PCO2ART 54.9*  PO2ART 237.0*   Liver Enzymes  Recent Labs Lab 05/22/15 1327  AST 33  ALT 24  ALKPHOS 55  BILITOT 0.6  ALBUMIN 3.2*   Cardiac Enzymes  Recent Labs Lab 05/22/15 1640 05/22/15 2055 05/23/15 0240  TROPONINI 0.08* 0.09* 0.09*   Glucose  Recent Labs Lab  05/23/15 0750 05/23/15 1158 05/23/15 1719 05/23/15 1934 05/24/15 0020 05/24/15 0348  GLUCAP 164* 146* 158* 108* 157* 166*    Imaging Dg Chest Port 1 View  05/24/2015   CLINICAL DATA:  Acute respiratory failure  EXAM: PORTABLE CHEST - 1 VIEW  COMPARISON:  05/23/2015  FINDINGS: Endotracheal tube and nasogastric catheter are again identified and stable in appearance. There is improved aeration in the left lung when compare with the prior study. No acute bony abnormality is noted. No new focal infiltrate is seen.  IMPRESSION: Improved aeration the left lung when compared with previous exam.  Tubes and lines as described.   Electronically Signed   By: Alcide Clever M.D.   On: 05/24/2015 08:16  CXR w/ marked improvement specifically LLL    ASSESSMENT /  PLAN:  PULMONARY OETT 6/23 >> 6/25 A: Acute hypoxic and hypercarbic respiratory failure in setting of pulmonary edema/decomepsated HF Possible aspiration PNA (unimpressed) LLL airspace disease improved.  1.6L neg volume status over last 24hrs.  P:   Wean SBT done Extubate Wean FIO2,mobilize  Re-assess for diuresis 6/26  CARDIOVASCULAR CVL A: Acute decompensated systolic heart failure R/O MI  H/o afib  Mild trop rise c/w decomp HF.  P:  -Tele -cont xarelto  Cont tele; even vol status goal 6/25  RENAL A:   AKI P:   -Diuresis as BP and SCr tolerate (will hold 6/25 w/ scr rise) -Strict I&O -KVO fluids -monitor bmet -repeat Mg and K in am  GASTROINTESTINAL A:  No acute issue P:   -tubefeeding protocol -PPI for SUP  HEMATOLOGIC A:   Anemia  P:  -CBC in am -cont xerelto  INFECTIOUS A:  Possible aspiration PNA (unimpressed) P:   unasyn 6/24>>> BCX2 6/24>>  ENDOCRINE A:  Hyperglycemia P:   -SSI per protocol -Q 4 hour blood glucose checks  NEUROLOGIC A:   Acute encephalopathy- likely due to hypoxia in route- now follows commands P:   -PAD protocol RASS goal: -1 to -2    FAMILY  - Updates:  Spoke with pt's wife and son 6/24  - Inter-disciplinary family meet or Palliative Care meeting due by:  6/30   TODAY'S SUMMARY: Ryan Macdonald is a 81 yo male who was delivered to ED via EMS and en route became increasingly hypoxic, bradycardic, and unresponsive. He was intubated for hypoxic respiratory failure in setting of decompensated HF +/- PNA. Passed SBT, ready for extubation. Hold lasix for today. Reassess 6/26.   Simonne Martinet ACNP-BC [facility] Pulmonary/Critical Care Pager # [phone] OR # [phone] if no answer     STAFF NOTE: I, Rory Percy, MD FACP have personally reviewed patient's available data, including medical history, events of note, physical examination and test results as part of my evaluation. I have discussed with resident/NP  and other care providers such as pharmacist, RN and RRT. In addition, I personally evaluated patient and elicited key findings of: weaning well, more awake, neg 700 cc, lung improved examination, pcxr resolving edema, PCR neg and low clinical suspsion infection, dc abx, consider extubation, assessed RSBI, may need lasix hold with crt trend and pcxr drying out, tele to remain The patient is critically ill with multiple organ systems failure and requires high complexity decision making for assessment and support, frequent evaluation and titration of therapies, application of advanced monitoring technologies and extensive interpretation of multiple databases.   Critical Care Time devoted to patient care services described in this note is30 Minutes. This time reflects time of care  of this signee: Rory Percy, MD FACP. This critical care time does not reflect procedure time, or teaching time or supervisory time of PA/NP/Med student/Med Resident etc but could involve care discussion time. Rest per NP/medical resident whose note is outlined above and that I agree with   Mcarthur Rossetti. Tyson Alias, MD, FACP Pgr: [phone] [facility] 05/24/2015 11:51 AM

## 2015-05-25 ENCOUNTER — Inpatient Hospital Stay (HOSPITAL_COMMUNITY): Payer: Medicare Other

## 2015-05-25 ENCOUNTER — Encounter (HOSPITAL_COMMUNITY): Payer: Self-pay

## 2015-05-25 DIAGNOSIS — E669 Obesity, unspecified: Secondary | ICD-10-CM

## 2015-05-25 DIAGNOSIS — N289 Disorder of kidney and ureter, unspecified: Secondary | ICD-10-CM

## 2015-05-25 DIAGNOSIS — I5023 Acute on chronic systolic (congestive) heart failure: Principal | ICD-10-CM

## 2015-05-25 LAB — GLUCOSE, CAPILLARY
GLUCOSE-CAPILLARY: 120 mg/dL — AB (ref 65–99)
GLUCOSE-CAPILLARY: 132 mg/dL — AB (ref 65–99)
GLUCOSE-CAPILLARY: 143 mg/dL — AB (ref 65–99)
Glucose-Capillary: 111 mg/dL — ABNORMAL HIGH (ref 65–99)
Glucose-Capillary: 114 mg/dL — ABNORMAL HIGH (ref 65–99)
Glucose-Capillary: 119 mg/dL — ABNORMAL HIGH (ref 65–99)
Glucose-Capillary: 149 mg/dL — ABNORMAL HIGH (ref 65–99)
Glucose-Capillary: 153 mg/dL — ABNORMAL HIGH (ref 65–99)

## 2015-05-25 LAB — BASIC METABOLIC PANEL
Anion gap: 11 (ref 5–15)
BUN: 27 mg/dL — ABNORMAL HIGH (ref 6–20)
CO2: 29 mmol/L (ref 22–32)
CREATININE: 1.33 mg/dL — AB (ref 0.61–1.24)
Calcium: 9 mg/dL (ref 8.9–10.3)
Chloride: 99 mmol/L — ABNORMAL LOW (ref 101–111)
GFR calc Af Amer: 57 mL/min — ABNORMAL LOW (ref >60)
GFR calc non Af Amer: 50 mL/min — ABNORMAL LOW (ref >60)
Glucose, Bld: 118 mg/dL — ABNORMAL HIGH (ref 65–99)
Potassium: 4.1 mmol/L (ref 3.5–5.1)
SODIUM: 139 mmol/L (ref 135–145)

## 2015-05-25 LAB — PROCALCITONIN: Procalcitonin: <0.1 ng/mL

## 2015-05-25 LAB — D-DIMER, QUANTITATIVE (NOT AT ARMC): D DIMER QUANT: 1.05 ug{FEU}/mL — AB (ref 0.00–0.48)

## 2015-05-25 LAB — MAGNESIUM: Magnesium: 1.9 mg/dL (ref 1.7–2.4)

## 2015-05-25 MED ORDER — NIACIN 500 MG PO TABS: 500.0000 mg | ORAL_TABLET | Freq: Every evening | ORAL | Status: DC | PRN

## 2015-05-25 MED ORDER — INSULIN ASPART 100 UNIT/ML ~~LOC~~ SOLN
0.0000 [IU] | Freq: Three times a day (TID) | SUBCUTANEOUS | Status: DC
  Administered 2015-05-25: 1 [IU] via SUBCUTANEOUS
  Administered 2015-05-25: 2 [IU] via SUBCUTANEOUS
  Administered 2015-05-26 (×2): 1 [IU] via SUBCUTANEOUS
  Administered 2015-05-26: 2 [IU] via SUBCUTANEOUS
  Administered 2015-05-27 (×2): 1 [IU] via SUBCUTANEOUS

## 2015-05-25 MED ORDER — INSULIN ASPART 100 UNIT/ML ~~LOC~~ SOLN: 0.0000 [IU] | Freq: Every day | SUBCUTANEOUS | Status: DC

## 2015-05-25 MED ORDER — ISOSORBIDE DINITRATE ER 40 MG PO CPCR
40.0000 mg | ORAL_CAPSULE | Freq: Three times a day (TID) | ORAL | Status: DC
  Administered 2015-05-25 – 2015-05-27 (×7): 40 mg via ORAL
  Filled 2015-05-25 (×9): qty 1

## 2015-05-25 MED ORDER — INSULIN ASPART 100 UNIT/ML ~~LOC~~ SOLN: 2.0000 [IU] | Freq: Three times a day (TID) | SUBCUTANEOUS | Status: DC

## 2015-05-25 MED ORDER — ASPIRIN 81 MG PO CHEW
81.0000 mg | CHEWABLE_TABLET | Freq: Every day | ORAL | Status: DC
  Administered 2015-05-25 – 2015-05-27 (×3): 81 mg via ORAL
  Filled 2015-05-25 (×2): qty 1

## 2015-05-25 MED ORDER — ISOSORBIDE DINITRATE ER 40 MG PO TBCR
40.0000 mg | EXTENDED_RELEASE_TABLET | Freq: Three times a day (TID) | ORAL | Status: DC
  Filled 2015-05-25 (×3): qty 1

## 2015-05-25 MED ORDER — HYDROCODONE-ACETAMINOPHEN 5-325 MG PO TABS
1.0000 | ORAL_TABLET | Freq: Three times a day (TID) | ORAL | Status: DC | PRN
  Administered 2015-05-25 – 2015-05-26 (×3): 1 via ORAL
  Filled 2015-05-25 (×3): qty 1

## 2015-05-25 MED ORDER — NIACIN ER 500 MG PO CPCR
500.0000 mg | ORAL_CAPSULE | Freq: Every evening | ORAL | Status: DC | PRN
  Filled 2015-05-25: qty 1

## 2015-05-25 MED ORDER — ATORVASTATIN CALCIUM 40 MG PO TABS: 40.0000 mg | ORAL_TABLET | Freq: Every day | ORAL | Status: DC

## 2015-05-25 MED ORDER — TAMSULOSIN HCL 0.4 MG PO CAPS
0.4000 mg | ORAL_CAPSULE | Freq: Every day | ORAL | Status: DC
  Administered 2015-05-25 – 2015-05-26 (×2): 0.4 mg via ORAL
  Filled 2015-05-25 (×3): qty 1

## 2015-05-25 MED ORDER — IOHEXOL 350 MG/ML SOLN
80.0000 mL | Freq: Once | INTRAVENOUS | Status: AC | PRN
  Administered 2015-05-25: 80 mL via INTRAVENOUS

## 2015-05-25 MED ORDER — HYDROCODONE-ACETAMINOPHEN 5-325 MG PO TABS: 1.0000 | ORAL_TABLET | Freq: Three times a day (TID) | ORAL | Status: DC | PRN

## 2015-05-25 MED ORDER — ACETAMINOPHEN 325 MG PO TABS
650.0000 mg | ORAL_TABLET | Freq: Four times a day (QID) | ORAL | Status: DC | PRN
  Administered 2015-05-25: 650 mg via ORAL
  Filled 2015-05-25: qty 2

## 2015-05-25 MED ORDER — FUROSEMIDE 40 MG PO TABS
40.0000 mg | ORAL_TABLET | Freq: Two times a day (BID) | ORAL | Status: DC
  Administered 2015-05-25 – 2015-05-27 (×5): 40 mg via ORAL
  Filled 2015-05-25 (×7): qty 1

## 2015-05-25 MED ORDER — PREDNISOLONE ACETATE 1 % OP SUSP
1.0000 [drp] | Freq: Two times a day (BID) | OPHTHALMIC | Status: DC
  Administered 2015-05-25 – 2015-05-27 (×5): 1 [drp] via OPHTHALMIC
  Filled 2015-05-25: qty 1

## 2015-05-25 MED ORDER — SPIRONOLACTONE 25 MG PO TABS
25.0000 mg | ORAL_TABLET | Freq: Every day | ORAL | Status: DC
  Administered 2015-05-25 – 2015-05-27 (×3): 25 mg via ORAL
  Filled 2015-05-25 (×3): qty 1

## 2015-05-25 MED ORDER — CARVEDILOL 25 MG PO TABS: 25.0000 mg | ORAL_TABLET | Freq: Two times a day (BID) | ORAL | Status: DC

## 2015-05-25 MED ORDER — PREDNISOLONE ACETATE 0.12 % OP SUSP
1.0000 [drp] | Freq: Two times a day (BID) | OPHTHALMIC | Status: DC
  Filled 2015-05-25: qty 5

## 2015-05-25 MED ORDER — CARVEDILOL 6.25 MG PO TABS
6.2500 mg | ORAL_TABLET | Freq: Two times a day (BID) | ORAL | Status: DC
  Administered 2015-05-25 – 2015-05-27 (×5): 6.25 mg via ORAL
  Filled 2015-05-25 (×7): qty 1

## 2015-05-25 MED ORDER — TRAMADOL HCL 50 MG PO TABS
50.0000 mg | ORAL_TABLET | Freq: Four times a day (QID) | ORAL | Status: DC | PRN
  Administered 2015-05-25: 50 mg via ORAL
  Filled 2015-05-25: qty 1

## 2015-05-25 MED ORDER — VITAMIN B-12 1000 MCG PO TABS
2000.0000 ug | ORAL_TABLET | Freq: Every day | ORAL | Status: DC
  Administered 2015-05-25 – 2015-05-27 (×3): 2000 ug via ORAL
  Filled 2015-05-25 (×3): qty 2

## 2015-05-25 NOTE — Progress Notes (Signed)
Pt arrived to the unit via bed. Oriented to the room, VSS on O2 2L.

## 2015-05-25 NOTE — Progress Notes (Signed)
[place] TEAM 1 - Stepdown/ICU TEAM Progress Note  Ryan Macdonald ZOX:[id number] DOB: 1934-02-29 DOA: 05/22/2015 PCP: [facility]  Admit HPI / Brief Narrative: 81 yo male presented to ED on 6/23. He was home alone when he became SOB and called EMS. He was hypoxic (sats in 60s), bradycardic, and unresponsive en route to ED, and upon arrival was immediately intubated. He has a history of systolic CHF w/ EF noted to be 25-30% on a 2014 TTE. Chest radiograph concerning for pulmonary edema.  Significant Events: 6/23 - unresponsive - acute respiratory failure requiring intubation 6/24 - febrile - abx started  HPI/Subjective: The patient states he feels much better this morning.  He denies current shortness of breath nausea vomiting chest pain or abdominal pain.  He confirms that he had been feeling fine and that his shortness of breath occurred very suddenly.  He states he has not had an echocardiogram since 2014.  He also informs me he has been off rivaroxaban since 2014 due to an episode of gross hematuria.  Assessment/Plan:  Acute hypoxic and hypercarbic respiratory failure Felt to have been due to pulmonary edema - essentially resolved - wean to room air and follow  Pulmonary edema - acute decompensated systolic CHF Baseline wgt per Cards records ~117kg - current wgt 119 kg (same as at admission) though reportedly net negative approximately 4 L since admission - no severe edema on exam - TTE ordered - resume home meds and follow   LLL airspace disease - ?aspiration  Pro calcitonin and clinical exam not convincing - now off antibiotics - follow clinically  Hx Afib postop 2014 (THA) patient refuses anticoagulation other than aspirin at this time due to a prior episode of gross hematuria - recheck EKG as I'm presently unsure of his rhythm  Acute kidney injury  Baseline creatinine appears to be approximately 1.2 - follow with resumption of home medical therapy   Normocytic  Anemia  Baseline actually appears to be approximately 10 - hemoglobin presently higher than baseline - check anemia panel - no obvious blood loss - follow  DM Check A1c - is not on diabetes medicine at home  HLD Check lipid panel in am - cont home dose of Zocor   Obesity - Body mass index is 41.11 kg/(m^2).  Code Status: FULL Family Communication: no family present at time of exam Disposition Plan: stable for transfer to tele bed - investigate acute resp failure further - PT/OT - wean O2  Consultants: PCCM > TRH  Procedures: none  Antibiotics: Unasyn 6/24  DVT prophylaxis: lovenox  Objective: Blood pressure 117/83, pulse 87, temperature 97.4 F (36.3 C), temperature source Oral, resp. rate 22, height  (1.702 m), weight 119.1 kg (262 lb 9.1 oz), SpO2 97 %.  Intake/Output Summary (Last 24 hours) at 05/25/15 0912 Last data filed at 05/25/15 0800  Gross per 24 hour  Intake    720 ml  Output   1675 ml  Net   -955 ml   Exam: General: No acute respiratory distress up in chair  Lungs: Clear to auscultation bilaterally without wheezes or crackles Cardiovascular: Regular rate and rhythm without murmur gallop or rub normal S1 and S2 Abdomen: Nontender, nondistended, soft, bowel sounds positive, no rebound, no ascites, no appreciable mass Extremities: No significant cyanosis, clubbing, trace edema bilateral lower extremities  Data Reviewed: Basic Metabolic Panel:  Recent Labs Lab 05/22/15 1327 05/22/15 1354 05/22/15 1640 05/23/15 0240 05/24/15 0235 05/25/15 0228  NA 140 140  --  141 142 139  K 4.4 4.5  --  3.5 3.7 4.1  CL 106 106  --  103 102 99*  CO2 23  --   --  25 27 29   GLUCOSE 254* 252*  --  127* 131* 118*  BUN 19 26*  --  19 26* 27*  CREATININE 1.50* 1.30* 1.43* 1.23 1.50* 1.33*  CALCIUM 9.2  --   --  9.5 9.6 9.0  MG  --   --   --  1.6* 1.7 1.9  PHOS  --   --   --  2.2* 2.5  --     CBC:  Recent Labs Lab 05/22/15 1327 05/22/15 1354 05/22/15 1640  05/23/15 0240  WBC 11.1*  --  9.6 9.7  NEUTROABS 5.6  --   --   --   HGB 12.5* 14.3 11.2* 11.4*  HCT 38.3* 42.0 33.8* 34.2*  MCV 97.0  --  95.8 95.5  PLT 251  --  215 229    Liver Function Tests:  Recent Labs Lab 05/22/15 1327  AST 33  ALT 24  ALKPHOS 55  BILITOT 0.6  PROT 7.6  ALBUMIN 3.2*   Coags:  Recent Labs Lab 05/22/15 1401  INR 1.01   Cardiac Enzymes:  Recent Labs Lab 05/22/15 1640 05/22/15 2055 05/23/15 0240  TROPONINI 0.08* 0.09* 0.09*    CBG:  Recent Labs Lab 05/24/15 1600 05/24/15 1943 05/24/15 2348 05/25/15 0406 05/25/15 0728  GLUCAP 142* 120* 111* 119* 132*    Recent Results (from the past 240 hour(s))  MRSA PCR Screening     Status: None   Collection Time: 05/22/15  4:02 PM  Result Value Ref Range Status   MRSA by PCR NEGATIVE NEGATIVE Final    Comment:        The GeneXpert MRSA Assay (FDA approved for NASAL specimens only), is one component of a comprehensive MRSA colonization surveillance program. It is not intended to diagnose MRSA infection nor to guide or monitor treatment for MRSA infections.   Culture, blood (routine x 2)     Status: None (Preliminary result)   Collection Time: 05/23/15  7:44 PM  Result Value Ref Range Status   Specimen Description BLOOD RIGHT HAND  Final   Special Requests   Final    BOTTLES DRAWN AEROBIC AND ANAEROBIC BLUE 10CC RED 9CC   Culture NO GROWTH < 24 HOURS  Final   Report Status PENDING  Incomplete  Culture, blood (routine x 2)     Status: None (Preliminary result)   Collection Time: 05/23/15  7:50 PM  Result Value Ref Range Status   Specimen Description BLOOD LEFT HAND  Final   Special Requests BOTTLES DRAWN AEROBIC AND ANAEROBIC 10CC  Final   Culture NO GROWTH < 24 HOURS  Final   Report Status PENDING  Incomplete     Studies:   Recent x-ray studies have been reviewed in detail by the Attending Physician  Scheduled Meds:  Scheduled Meds: . antiseptic oral rinse  7 mL Mouth  Rinse QID  . aspirin  81 mg Oral Daily  . famotidine  20 mg Oral BID  . insulin aspart  2-6 Units Subcutaneous 6 times per day  . rivaroxaban  20 mg Per Tube Q supper  . simvastatin  40 mg Oral q1800    Time spent on care of this patient: 35 mins   Kiandre Spagnolo T , MD   [facility] Office  [phone] Pager - Text Page per Loretha Stapler as  per below:  On-Call/Text Page:      [email or web address]      password [email or web address]  If 7PM-7AM, please contact night-coverage [email or web address] Password [email or web address] 05/25/2015, 9:12 AM   LOS: 3 days

## 2015-05-25 NOTE — Evaluation (Signed)
Physical Therapy Evaluation Patient Details Name: Ryan Macdonald MRN: [id number] DOB: 30-Sep-1934 Today's Date: 05/25/2015   History of Present Illness  Patient is a 81 yo male admitted 05/22/15 with acute respiratory failure with hypoxia, unresponsive and intubated in ED.  Patient extubated 05/24/15.  PMH:  CHF, NICM, EF 25-30%, PAF, HTN, DM, obesity    Clinical Impression  Patient presents with problems listed below.  Will benefit from acute PT to maximize functional mobility prior to discharge.  Patient will need to function at supervision level to return home with wife.  If unable to achieve this level, may need to consider SNF.    Follow Up Recommendations Home health PT;Supervision/Assistance - 24 hour (may need to consider SNF if unable to reach supervision level)    Equipment Recommendations  None recommended by PT    Recommendations for Other Services       Precautions / Restrictions Precautions Precautions: Fall Restrictions Weight Bearing Restrictions: No      Mobility  Bed Mobility Overal bed mobility: Needs Assistance;+2 for physical assistance Bed Mobility: Supine to Sit;Sit to Supine     Supine to sit: Mod assist;+2 for physical assistance Sit to supine: Mod assist;+2 for physical assistance   General bed mobility comments: Verbal cues for technique.  Assist to raise trunk to upright position.  Once sitting EOB, patient able to maintain balance with min guard assist.  Assist to lower trunk and bring LE's onto bed to return to sidelying>supine.  Required +2 assist to move to [facility].  Increased time required for all bed mobility.  Transfers                 General transfer comment: Patient declined to attempt standing today due to Rt ankle pain and fatigue.  Ambulation/Gait                Stairs            Wheelchair Mobility    Modified Rankin (Stroke Patients Only)       Balance Overall balance assessment: Needs  assistance Sitting-balance support: Single extremity supported;Feet supported Sitting balance-Leahy Scale: Poor Sitting balance - Comments: Required UE support to maintain balance. Postural control: Posterior lean;Right lateral lean                                   Pertinent Vitals/Pain Pain Assessment: 0-10 Pain Score: 7  Pain Location: Lt shoulder and Rt ankle Pain Descriptors / Indicators: Aching;Sore Pain Intervention(s): Limited activity within patient's tolerance;Repositioned;Patient requesting pain meds-RN notified    Home Living Family/patient expects to be discharged to:: Private residence Living Arrangements: Spouse/significant other Available Help at Discharge: Family;Available 24 hours/day Type of Home: House Home Access: Stairs to enter   [employer] of Steps: 3 Home Layout: One level (3 steps inside house between rooms) Home Equipment: Dan Humphreys - 2 wheels;Cane - single point      Prior Function Level of Independence: Independent         Comments: Drives     Hand Dominance        Extremity/Trunk Assessment   Upper Extremity Assessment: Generalized weakness (Decreased shoulder ROM bil.)           Lower Extremity Assessment: Generalized weakness (Strength grossly 3/5)         Communication   Communication: No difficulties  Cognition Arousal/Alertness: Awake/alert Behavior During Therapy: WFL for tasks assessed/performed Overall Cognitive Status: Within  Functional Limits for tasks assessed                      General Comments      Exercises        Assessment/Plan    PT Assessment Patient needs continued PT services  PT Diagnosis Difficulty walking;Generalized weakness;Acute pain   PT Problem List Decreased strength;Decreased range of motion;Decreased activity tolerance;Decreased balance;Decreased mobility;Decreased knowledge of use of DME;Cardiopulmonary status limiting activity;Obesity;Pain  PT  Treatment Interventions DME instruction;Gait training;Functional mobility training;Therapeutic activities;Therapeutic exercise;Balance training;Patient/family education   PT Goals (Current goals can be found in the Care Plan section) Acute Rehab PT Goals Patient Stated Goal: To get stronger PT Goal Formulation: With patient/family Time For Goal Achievement: 06/01/15 Potential to Achieve Goals: Good    Frequency Min 3X/week   Barriers to discharge        Co-evaluation               End of Session Equipment Utilized During Treatment: Oxygen Activity Tolerance: Patient limited by fatigue;Patient limited by pain Patient left: in bed;with call bell/phone within reach;with family/visitor present Nurse Communication: Mobility status;Patient requests pain meds         Time: 1419-1435 PT Time Calculation (min) (ACUTE ONLY): 16 min   Charges:   PT Evaluation $Initial PT Evaluation Tier I: 1 Procedure     PT G CodesVena Austria 06-03-15, 3:55 PM Durenda Hurt. Renaldo Fiddler, [facility] Acute Rehab Services Pager [phone]

## 2015-05-26 ENCOUNTER — Inpatient Hospital Stay (HOSPITAL_COMMUNITY): Payer: Medicare Other

## 2015-05-26 DIAGNOSIS — J9600 Acute respiratory failure, unspecified whether with hypoxia or hypercapnia: Secondary | ICD-10-CM

## 2015-05-26 DIAGNOSIS — I509 Heart failure, unspecified: Secondary | ICD-10-CM

## 2015-05-26 DIAGNOSIS — J9601 Acute respiratory failure with hypoxia: Secondary | ICD-10-CM

## 2015-05-26 DIAGNOSIS — M10479 Other secondary gout, unspecified ankle and foot: Secondary | ICD-10-CM

## 2015-05-26 LAB — COMPREHENSIVE METABOLIC PANEL WITH GFR
ALT: 18 U/L (ref 17–63)
AST: 23 U/L (ref 15–41)
Albumin: 3.1 g/dL — ABNORMAL LOW (ref 3.5–5.0)
Alkaline Phosphatase: 56 U/L (ref 38–126)
Anion gap: 10 (ref 5–15)
BUN: 32 mg/dL — ABNORMAL HIGH (ref 6–20)
CO2: 31 mmol/L (ref 22–32)
Calcium: 9.5 mg/dL (ref 8.9–10.3)
Chloride: 96 mmol/L — ABNORMAL LOW (ref 101–111)
Creatinine, Ser: 1.58 mg/dL — ABNORMAL HIGH (ref 0.61–1.24)
GFR calc Af Amer: 47 mL/min — ABNORMAL LOW
GFR calc non Af Amer: 40 mL/min — ABNORMAL LOW
Glucose, Bld: 136 mg/dL — ABNORMAL HIGH (ref 65–99)
Potassium: 4.5 mmol/L (ref 3.5–5.1)
Sodium: 137 mmol/L (ref 135–145)
Total Bilirubin: 0.9 mg/dL (ref 0.3–1.2)
Total Protein: 7.9 g/dL (ref 6.5–8.1)

## 2015-05-26 LAB — URINALYSIS, ROUTINE W REFLEX MICROSCOPIC
Bilirubin Urine: NEGATIVE
GLUCOSE, UA: NEGATIVE mg/dL
KETONES UR: NEGATIVE mg/dL
NITRITE: NEGATIVE
Protein, ur: NEGATIVE mg/dL
Specific Gravity, Urine: 1.013 (ref 1.005–1.030)
Urobilinogen, UA: 0.2 mg/dL (ref 0.0–1.0)
pH: 5 (ref 5.0–8.0)

## 2015-05-26 LAB — GLUCOSE, CAPILLARY
GLUCOSE-CAPILLARY: 160 mg/dL — AB (ref 65–99)
Glucose-Capillary: 142 mg/dL — ABNORMAL HIGH (ref 65–99)
Glucose-Capillary: 154 mg/dL — ABNORMAL HIGH (ref 65–99)
Glucose-Capillary: 143 mg/dL — ABNORMAL HIGH (ref 65–99)
Glucose-Capillary: 129 mg/dL — ABNORMAL HIGH (ref 65–99)

## 2015-05-26 LAB — IRON AND TIBC
Iron: 21 ug/dL — ABNORMAL LOW (ref 45–182)
Saturation Ratios: 7 % — ABNORMAL LOW (ref 17.9–39.5)
TIBC: 297 ug/dL (ref 250–450)
UIBC: 276 ug/dL

## 2015-05-26 LAB — LIPID PANEL
CHOLESTEROL: 159 mg/dL (ref 0–200)
HDL: 35 mg/dL — ABNORMAL LOW (ref >40)
LDL Cholesterol: 111 mg/dL — ABNORMAL HIGH (ref 0–99)
TRIGLYCERIDES: 66 mg/dL (ref <150)
Total CHOL/HDL Ratio: 4.5 RATIO
VLDL: 13 mg/dL (ref 0–40)

## 2015-05-26 LAB — RETICULOCYTES
RBC.: 3.59 MIL/uL — ABNORMAL LOW (ref 4.22–5.81)
RETIC CT PCT: 2.1 % (ref 0.4–3.1)
Retic Count, Absolute: 75.4 10*3/uL (ref 19.0–186.0)

## 2015-05-26 LAB — CBC
HCT: 34.2 % — ABNORMAL LOW (ref 39.0–52.0)
Hemoglobin: 11.1 g/dL — ABNORMAL LOW (ref 13.0–17.0)
MCH: 30.9 pg (ref 26.0–34.0)
MCHC: 32.5 g/dL (ref 30.0–36.0)
MCV: 95.3 fL (ref 78.0–100.0)
Platelets: 269 K/uL (ref 150–400)
RBC: 3.59 MIL/uL — ABNORMAL LOW (ref 4.22–5.81)
RDW: 13.5 % (ref 11.5–15.5)
WBC: 11.8 K/uL — ABNORMAL HIGH (ref 4.0–10.5)

## 2015-05-26 LAB — URINE MICROSCOPIC-ADD ON

## 2015-05-26 LAB — FOLATE: Folate: 8.8 ng/mL

## 2015-05-26 LAB — FERRITIN: Ferritin: 197 ng/mL (ref 24–336)

## 2015-05-26 LAB — VITAMIN B12: VITAMIN B 12: 1096 pg/mL — AB (ref 180–914)

## 2015-05-26 MED ORDER — GUAIFENESIN-DM 100-10 MG/5ML PO SYRP
5.0000 mL | ORAL_SOLUTION | ORAL | Status: DC | PRN
  Administered 2015-05-26 – 2015-05-27 (×3): 5 mL via ORAL
  Filled 2015-05-26 (×3): qty 5

## 2015-05-26 NOTE — Progress Notes (Signed)
Progress Note  Ryan Macdonald QQI:[id number] DOB: 05-28-1934 DOA: 05/22/2015 PCP: [facility]  Admit HPI / Brief Narrative: 81 yo male presented to ED on 6/23. He was home alone when he became SOB and called EMS. He was hypoxic (sats in 60s), bradycardic, and unresponsive en route to ED, and upon arrival was immediately intubated. He has a history of systolic CHF w/ EF noted to be 25-30% on a 2014 TTE. Chest radiograph concerning for pulmonary edema.  Significant Events: 6/23 - unresponsive - acute respiratory failure requiring intubation 6/24 - febrile - abx started  HPI/Subjective: The patient states he feels much better this morning.  He denies current shortness of breath nausea vomiting chest pain or abdominal pain.  He confirms that he had been feeling fine and that his shortness of breath occurred very suddenly.  He states he has not had an echocardiogram since 2014.  He also informs me he has been off rivaroxaban since 2014 due to an episode of gross hematuria.  Assessment/Plan:  Acute hypoxic and hypercarbic respiratory failure Felt to have been due to pulmonary edema - essentially resolved - wean to room air and follow. Still on 2 L of oxygen, does not use oxygen at home.  Pulmonary edema - acute decompensated systolic CHF Baseline wgt per Cards records weight is 117.8 Kg, down from 121.5 Kg on admission.  approximately 4 L since admission - no severe edema on exam - TTE ordered - resume home meds and follow   LLL airspace disease - ?aspiration  Pro calcitonin and clinical exam not convincing, antibiotics discontinued, continues to be afebrile.  Hx Afib postop 2014 (THA) patient refuses anticoagulation other than aspirin at this time due to a prior episode of gross hematuria This patients CHA2DS2-VASc Score and unadjusted Ischemic Stroke Rate (% per year) is equal to 9.7 % stroke rate/year from a score of 6 for CHF, HTN, DM, vascular and age >82. Above score  calculated as 1 point each if present [CHF, HTN, DM, Vascular=MI/PAD/Aortic Plaque, Age if 65-74, or Male] Above score calculated as 2 points each if present [Age > 75, or Stroke/TIA/TE]  Acute kidney injury  Baseline creatinine appears to be approximately 1.2 - follow with resumption of home medical therapy   Normocytic Anemia  Baseline actually appears to be approximately 10 - hemoglobin presently higher than baseline - check anemia panel - no obvious blood loss - follow  DM Check A1c - is not on diabetes medicine at home  HLD Check lipid panel in am - cont home dose of Zocor   Obesity - Body mass index is 40.69 kg/(m^2).  Code Status: FULL Family Communication: no family present at time of exam Disposition Plan: stable for transfer to tele bed - investigate acute resp failure further - PT/OT - wean O2  Consultants: PCCM > TRH  Procedures: none  Antibiotics: Unasyn 6/24  DVT prophylaxis: lovenox  Objective: Blood pressure 110/64, pulse 80, temperature 98 F (36.7 C), temperature source Oral, resp. rate 16, height 5\' 7"  (1.702 m), weight 117.885 kg (259 lb 14.2 oz), SpO2 100 %.  Intake/Output Summary (Last 24 hours) at 05/26/15 1253 Last data filed at 05/26/15 0902  Gross per 24 hour  Intake    840 ml  Output   1475 ml  Net   -635 ml   Exam: General: No acute respiratory distress up in chair  Lungs: Clear to auscultation bilaterally without wheezes or crackles Cardiovascular: Regular rate and rhythm without murmur gallop  or rub normal S1 and S2 Abdomen: Nontender, nondistended, soft, bowel sounds positive, no rebound, no ascites, no appreciable mass Extremities: No significant cyanosis, clubbing, trace edema bilateral lower extremities  Data Reviewed: Basic Metabolic Panel:  Recent Labs Lab 05/22/15 1327 05/22/15 1354 05/22/15 1640 05/23/15 0240 05/24/15 0235 05/25/15 0228 05/26/15 0409  NA 140 140  --  141 142 139 137  K 4.4 4.5  --  3.5 3.7 4.1 4.5   CL 106 106  --  103 102 99* 96*  CO2 23  --   --  25 27 29 31   GLUCOSE 254* 252*  --  127* 131* 118* 136*  BUN 19 26*  --  19 26* 27* 32*  CREATININE 1.50* 1.30* 1.43* 1.23 1.50* 1.33* 1.58*  CALCIUM 9.2  --   --  9.5 9.6 9.0 9.5  MG  --   --   --  1.6* 1.7 1.9  --   PHOS  --   --   --  2.2* 2.5  --   --     CBC:  Recent Labs Lab 05/22/15 1327 05/22/15 1354 05/22/15 1640 05/23/15 0240 05/26/15 0409  WBC 11.1*  --  9.6 9.7 11.8*  NEUTROABS 5.6  --   --   --   --   HGB 12.5* 14.3 11.2* 11.4* 11.1*  HCT 38.3* 42.0 33.8* 34.2* 34.2*  MCV 97.0  --  95.8 95.5 95.3  PLT 251  --  215 229 269    Liver Function Tests:  Recent Labs Lab 05/22/15 1327 05/26/15 0409  AST 33 23  ALT 24 18  ALKPHOS 55 56  BILITOT 0.6 0.9  PROT 7.6 7.9  ALBUMIN 3.2* 3.1*   Coags:  Recent Labs Lab 05/22/15 1401  INR 1.01   Cardiac Enzymes:  Recent Labs Lab 05/22/15 1640 05/22/15 2055 05/23/15 0240  TROPONINI 0.08* 0.09* 0.09*    CBG:  Recent Labs Lab 05/25/15 1612 05/25/15 2108 05/26/15 0531 05/26/15 1100 05/26/15 1127  GLUCAP 153* 143* 142* 160* 154*    Recent Results (from the past 240 hour(s))  MRSA PCR Screening     Status: None   Collection Time: 05/22/15  4:02 PM  Result Value Ref Range Status   MRSA by PCR NEGATIVE NEGATIVE Final    Comment:        The GeneXpert MRSA Assay (FDA approved for NASAL specimens only), is one component of a comprehensive MRSA colonization surveillance program. It is not intended to diagnose MRSA infection nor to guide or monitor treatment for MRSA infections.   Culture, blood (routine x 2)     Status: None (Preliminary result)   Collection Time: 05/23/15  7:44 PM  Result Value Ref Range Status   Specimen Description BLOOD RIGHT HAND  Final   Special Requests   Final    BOTTLES DRAWN AEROBIC AND ANAEROBIC BLUE 10CC RED 9CC   Culture NO GROWTH 2 DAYS  Final   Report Status PENDING  Incomplete  Culture, blood (routine x 2)      Status: None (Preliminary result)   Collection Time: 05/23/15  7:50 PM  Result Value Ref Range Status   Specimen Description BLOOD LEFT HAND  Final   Special Requests BOTTLES DRAWN AEROBIC AND ANAEROBIC 10CC  Final   Culture NO GROWTH 2 DAYS  Final   Report Status PENDING  Incomplete     Studies:   Recent x-ray studies have been reviewed in detail by the Attending Physician  Scheduled Meds:  Scheduled Meds: . antiseptic oral rinse  7 mL Mouth Rinse QID  . aspirin  81 mg Oral Daily  . carvedilol  6.25 mg Oral BID WC  . furosemide  40 mg Oral BID  . insulin aspart  0-5 Units Subcutaneous QHS  . insulin aspart  0-9 Units Subcutaneous TID WC  . isosorbide dinitrate  40 mg Oral 3 times per day  . prednisoLONE acetate  1 drop Right Eye BID  . simvastatin  40 mg Oral q1800  . spironolactone  25 mg Oral Daily  . tamsulosin  0.4 mg Oral QHS  . cyanocobalamin  2,000 mcg Oral Daily    Time spent on care of this patient: 35 mins   Clint Lipps , MD   [facility] Office  [phone] Pager - Text Page per Loretha Stapler as per below:  On-Call/Text Page:      [email or web address]      password [email or web address]  If 7PM-7AM, please contact night-coverage [email or web address] Password [facility] 05/26/2015, 12:53 PM   LOS: 4 days

## 2015-05-26 NOTE — Progress Notes (Signed)
*  PRELIMINARY RESULTS* Echocardiogram 2D Echocardiogram has been performed.  Ryan Macdonald 05/26/2015, 12:17 PM

## 2015-05-26 NOTE — Progress Notes (Signed)
I met with pt at bedside. We discussed the possibility of an inpt rehab admission. He has Medicare part A only and is [facility] 30% connected. He would like to receive rehab where the [place] would cover it 100%. I will verify VA coverage as secondary of what Medicare part A only does not cover and follow up in the morning. [phone]

## 2015-05-26 NOTE — Consult Note (Signed)
Physical Medicine and Rehabilitation Consult  Reason for Consult: Deconditioning in setting of CHF exacerbation with respiratory failure, NICM and obesity.  Referring Physician: Dr. Arthor Captain.    HPI: Ryan Macdonald is a 81 y.o. male with history of DM type 2, NICM, OA, morbid obesity who was admitted on 05/22/15 with SOB progressing to bradycardia and unresponsiveness in ED. He was intubated for acute respiratory failure due to fluid overload and started on IV diuresis. He developed fever after admission on 06/24 with LLL infiltrate concerning for aspiration PNA. Unasyn added with defervescence.  He tolerated extubation on 06/25 and respiratiory status stable. 2D echo done today.  PT/OT evaluations done and patient limited by bilateral ankle pain and left shoulder pain as well as weakness.  CIR recommended by MD and Rehab team.    Review of Systems  HENT: Negative for hearing loss.   Eyes: Negative for blurred vision.  Respiratory: Negative for cough and shortness of breath.   Cardiovascular: Negative for chest pain and palpitations.  Musculoskeletal: Positive for back pain (chronic).       Bilateral ankle pain  Neurological: Negative for sensory change and focal weakness.  Psychiatric/Behavioral: The patient is not nervous/anxious.       Past Medical History  Diagnosis Date  . Nonischemic cardiomyopathy     a. ? 2009 Cath in [place] - nl cors per pt;  b. 09/2013 Echo: EF 25-30%, sev diff HK.  Marland Kitchen Hypertension   . Cat scratch fever     removed mass on right side neck  . Cataracts, bilateral   . High cholesterol   . Osteoarthritis   . Kidney stones   . Diabetes mellitus without complication   . Morbid obesity   . PAF (paroxysmal atrial fibrillation)     a. post-op hip in 2014 - prev on xarelto.  . CHF (congestive heart failure)     Past Surgical History  Procedure Laterality Date  . Kidney stone surgery Right 1970's; 1983    "twice"  . Tonsillectomy and adenoidectomy   1940's  . Cystoscopy with urethral dilatation  10/23/2013  . Total hip arthroplasty Left 10/23/2013  . Inguinal hernia repair Right 1950's  . Circumcision  1940's  . Incision and drainage of wound Right 1980's    "cat scratch" (10/23/2013)  . Cardiac catheterization  1980's  . Total hip arthroplasty Left 10/23/2013    Procedure: TOTAL HIP ARTHROPLASTY;  Surgeon: Valeria Batman, MD;  Location: [facility] OR;  Service: Orthopedics;  Laterality: Left;  . Cystoscopy with urethral dilatation N/A 10/23/2013    Procedure: CYSTOSCOPY WITH URETHRAL DILATATION;  Surgeon: Kathi Ludwig, MD;  Location: [facility] OR;  Service: Urology;  Laterality: N/A;    Family History  Problem Relation Age of Onset  . Other      negative for premature CAD    Social History:  Married. Retired from [employer]. He reports that he has quit smoking. His smoking use included Cigarettes and Cigars. He has a 15 pack-year smoking history. He has never used smokeless tobacco. He reports that he does not drink alcohol or use illicit drugs.    Allergies  Allergen Reactions  . Lisinopril Swelling    Medications Prior to Admission  Medication Sig Dispense Refill  . HYDROcodone-acetaminophen (NORCO/VICODIN) 5-325 MG per tablet Take 1 tablet by mouth every 8 (eight) hours as needed for moderate pain.    . prednisoLONE acetate (PRED MILD) 0.12 % ophthalmic suspension Place 1 drop into the  right eye 2 (two) times daily.    Marland Kitchen PRESCRIPTION MEDICATION Place 1 drop into the right eye 2 (two) times daily.    . simvastatin (ZOCOR) 80 MG tablet Take 40 mg by mouth daily as needed (Per Dr orders).     . traMADol (ULTRAM) 50 MG tablet Take 50 mg by mouth every 6 (six) hours as needed for moderate pain.     Marland Kitchen aspirin 81 MG tablet Take 81 mg by mouth daily.    . carvedilol (COREG) 25 MG tablet Take 25 mg by mouth 2 (two) times daily with a meal.    . cyanocobalamin 1000 MCG tablet Take 2,000 mcg by mouth daily.    . furosemide  (LASIX) 40 MG tablet Take 40 mg by mouth 2 (two) times daily.    . hydrALAZINE (APRESOLINE) 10 MG tablet Take 20 mg by mouth 2 (two) times daily.    . isosorbide dinitrate (ISOCHRON) 40 MG CR tablet Take 40 mg by mouth 3 (three) times daily.    . niacin 500 MG tablet Take 500 mg by mouth at bedtime as needed (Per dr orders).    Marland Kitchen spironolactone (ALDACTONE) 25 MG tablet Take 25 mg by mouth daily.     . tamsulosin (FLOMAX) 0.4 MG CAPS capsule Take 0.4 mg by mouth at bedtime.    . [DISCONTINUED] methocarbamol (ROBAXIN) 500 MG tablet Take 1 tablet (500 mg total) by mouth every 8 (eight) hours as needed for muscle spasms. (Patient not taking: Reported on 01/22/2015) 30 tablet 0  . [DISCONTINUED] oxyCODONE (OXY IR/ROXICODONE) 5 MG immediate release tablet Take 1-2 tablets (5-10 mg total) by mouth every 4 (four) hours as needed for moderate pain, severe pain or breakthrough pain. (Patient not taking: Reported on 01/22/2015) 90 tablet 0  . [DISCONTINUED] Rivaroxaban (XARELTO) 20 MG TABS tablet Take 1 tablet (20 mg total) by mouth daily with supper. (Patient not taking: Reported on 01/22/2015) 30 tablet 0    Home: Home Living Family/patient expects to be discharged to:: Private residence Living Arrangements: Spouse/significant other Available Help at Discharge: Family, Available 24 hours/day Type of Home: Apartment Home Access: Stairs to enter [employer] of Steps: 4+4 Entrance Stairs-Rails: Right Home Layout: One level Home Equipment: Environmental consultant - 2 wheels, Cane - single point, Tub bench  Functional History: Prior Function Level of Independence: Independent Comments: Drives Functional Status:  Mobility: Bed Mobility Overal bed mobility: Needs Assistance Bed Mobility: Supine to Sit Supine to sit: Min assist Sit to supine: Mod assist, +2 for physical assistance General bed mobility comments: pt required rail and HOB elevated as well as min A to trunk to elevate self from bed. Increased  time needed as well Transfers Overall transfer level: Needs assistance Equipment used: Rolling walker (2 wheeled) Transfers: Sit to/from Stand, Stand Pivot Transfers Sit to Stand: +2 physical assistance, Mod assist Stand pivot transfers: +2 physical assistance, Min assist General transfer comment: Verbal cues for hand placement, assist to rise and to control descent to chair. Pt required vc's for stepping feet within RW, had great difficulty lifting right foot due to increased left ankle pain. Heavy reliance through UE's on RW Ambulation/Gait General Gait Details: pt unable to due to bilateral ankle pain, L>R    ADL: ADL Overall ADL's : Needs assistance/impaired Eating/Feeding: Independent, Sitting Grooming: Set up, Sitting Upper Body Bathing: Minimal assitance, Sitting Lower Body Bathing: Total assistance, Sit to/from stand, +2 for physical assistance Upper Body Dressing : Set up, Sitting Lower Body Dressing: +2 for  physical assistance, Total assistance, Sit to/from stand Toilet Transfer: +2 for safety/equipment, Minimal assistance, Stand-pivot, RW Toilet Transfer Details (indicate cue type and reason): simulated bed to chair with RW Toileting- Clothing Manipulation and Hygiene: +2 for physical assistance, Total assistance, Sit to/from stand  Cognition: Cognition Overall Cognitive Status: Within Functional Limits for tasks assessed Orientation Level: Oriented X4 Cognition Arousal/Alertness: Awake/alert Behavior During Therapy: WFL for tasks assessed/performed Overall Cognitive Status: Within Functional Limits for tasks assessed   Blood pressure 94/49, pulse 86, temperature 98.2 F (36.8 C), temperature source Oral, resp. rate 18, height 5\' 7"  (1.702 m), weight 117.885 kg (259 lb 14.2 oz), SpO2 96 %. Physical Exam  Nursing note and vitals reviewed. Constitutional: He is oriented to person, place, and time. He appears well-developed and well-nourished.  HENT:  Head:  Normocephalic.  Eyes: Conjunctivae are normal. Pupils are equal, round, and reactive to light.  Neck: Normal range of motion. Neck supple.  Cardiovascular: Normal rate and regular rhythm.   Respiratory: Effort normal. He has wheezes (audible).  GI: Soft. Bowel sounds are normal. He exhibits no distension. There is no tenderness.  Musculoskeletal: He exhibits edema (1+ left pedal edema.) and tenderness.  Right foot warm to touch--no erythema but tenderness dorsal area. Bilateral ankle pain Left > right.   Neurological: He is alert and oriented to person, place, and time.  Speech clear.  Tangential but able to follow basic commands. Needs redirection at times. STM deficits? UE 4/5 delt,bic,tricep, wrist, hand. LE: 2+HF, 3+KE and 3-4/5 ankles (pain at both ankles). Sensed gross pain/LT in both LE's.    Skin: Skin is warm and dry. No rash noted. No erythema.  Psychiatric: He has a normal mood and affect. His speech is normal and behavior is normal.    Results for orders placed or performed during the hospital encounter of 05/22/15 (from the past 24 hour(s))  Glucose, capillary     Status: Abnormal   Collection Time: 05/25/15  4:12 PM  Result Value Ref Range   Glucose-Capillary 153 (H) 65 - 99 mg/dL   Comment 1 Notify RN   Glucose, capillary     Status: Abnormal   Collection Time: 05/25/15  9:08 PM  Result Value Ref Range   Glucose-Capillary 143 (H) 65 - 99 mg/dL  Comprehensive metabolic panel     Status: Abnormal   Collection Time: 05/26/15  4:09 AM  Result Value Ref Range   Sodium 137 135 - 145 mmol/L   Potassium 4.5 3.5 - 5.1 mmol/L   Chloride 96 (L) 101 - 111 mmol/L   CO2 31 22 - 32 mmol/L   Glucose, Bld 136 (H) 65 - 99 mg/dL   BUN 32 (H) 6 - 20 mg/dL   Creatinine, Ser 2.44 (H) 0.61 - 1.24 mg/dL   Calcium 9.5 8.9 - 01.0 mg/dL   Total Protein 7.9 6.5 - 8.1 g/dL   Albumin 3.1 (L) 3.5 - 5.0 g/dL   AST 23 15 - 41 U/L   ALT 18 17 - 63 U/L   Alkaline Phosphatase 56 38 - 126 U/L    Total Bilirubin 0.9 0.3 - 1.2 mg/dL   GFR calc non Af Amer 40 (L) >60 mL/min   GFR calc Af Amer 47 (L) >60 mL/min   Anion gap 10 5 - 15  Vitamin B12     Status: Abnormal   Collection Time: 05/26/15  4:09 AM  Result Value Ref Range   Vitamin B-12 1096 (H) 180 - 914 pg/mL  Folate  Status: None   Collection Time: 05/26/15  4:09 AM  Result Value Ref Range   Folate 8.8 >5.9 ng/mL  Iron and TIBC     Status: Abnormal   Collection Time: 05/26/15  4:09 AM  Result Value Ref Range   Iron 21 (L) 45 - 182 ug/dL   TIBC 161 096 - 045 ug/dL   Saturation Ratios 7 (L) 17.9 - 39.5 %   UIBC 276 ug/dL  Ferritin     Status: None   Collection Time: 05/26/15  4:09 AM  Result Value Ref Range   Ferritin 197 24 - 336 ng/mL  Reticulocytes     Status: Abnormal   Collection Time: 05/26/15  4:09 AM  Result Value Ref Range   Retic Ct Pct 2.1 0.4 - 3.1 %   RBC. 3.59 (L) 4.22 - 5.81 MIL/uL   Retic Count, Manual 75.4 19.0 - 186.0 K/uL  CBC     Status: Abnormal   Collection Time: 05/26/15  4:09 AM  Result Value Ref Range   WBC 11.8 (H) 4.0 - 10.5 K/uL   RBC 3.59 (L) 4.22 - 5.81 MIL/uL   Hemoglobin 11.1 (L) 13.0 - 17.0 g/dL   HCT 40.9 (L) 81.1 - 91.4 %   MCV 95.3 78.0 - 100.0 fL   MCH 30.9 26.0 - 34.0 pg   MCHC 32.5 30.0 - 36.0 g/dL   RDW 78.2 95.6 - 21.3 %   Platelets 269 150 - 400 K/uL  Lipid panel     Status: Abnormal   Collection Time: 05/26/15  4:09 AM  Result Value Ref Range   Cholesterol 159 0 - 200 mg/dL   Triglycerides 66 <086 mg/dL   HDL 35 (L) >57 mg/dL   Total CHOL/HDL Ratio 4.5 RATIO   VLDL 13 0 - 40 mg/dL   LDL Cholesterol 846 (H) 0 - 99 mg/dL  Glucose, capillary     Status: Abnormal   Collection Time: 05/26/15  5:31 AM  Result Value Ref Range   Glucose-Capillary 142 (H) 65 - 99 mg/dL  Glucose, capillary     Status: Abnormal   Collection Time: 05/26/15 11:00 AM  Result Value Ref Range   Glucose-Capillary 160 (H) 65 - 99 mg/dL  Glucose, capillary     Status: Abnormal    Collection Time: 05/26/15 11:27 AM  Result Value Ref Range   Glucose-Capillary 154 (H) 65 - 99 mg/dL   Ct Angio Chest Pe W/cm &/or Wo Cm  05/25/2015   CLINICAL DATA:  Known shortness of breath with hypoxia, known history of CHF  EXAM: CT ANGIOGRAPHY CHEST WITH CONTRAST  TECHNIQUE: Multidetector CT imaging of the chest was performed using the standard protocol during bolus administration of intravenous contrast. Multiplanar CT image reconstructions and MIPs were obtained to evaluate the vascular anatomy.  CONTRAST:  80mL OMNIPAQUE IOHEXOL 350 MG/ML SOLN  COMPARISON:  None.  FINDINGS: The lungs are well aerated with some very minimal atelectatic changes in the left lower lobe. No focal confluent infiltrate or sizable effusion is noted.  The thoracic inlet is within normal limits. The thoracic aorta demonstrates calcific changes without aneurysmal dilatation or dissection. The pulmonary artery is well visualized and demonstrates a normal branching pattern. No intraluminal filling defect to suggest pulmonary embolism is identified. No hilar or mediastinal adenopathy is seen. Heavy coronary calcifications are noted. The larger of left ventricle is noted with diffuse ventricular wall thinning consistent with the patient's given clinical history of reduced ejection fraction and chronic CHF.  The  upper abdomen shows evidence of right-sided staghorn calculus. No other focal abnormality is noted in the upper abdomen. The osseous structures show degenerative change of the thoracic spine.  Review of the MIP images confirms the above findings.  IMPRESSION: No evidence of pulmonary emboli.  Changes consistent with the patient's given clinical history of cardiomyopathy  Minimal left basilar atelectasis.  Staghorn calculus within the right kidney.  Atherosclerotic changes within the thoracic aorta.   Electronically Signed   By: Alcide Clever M.D.   On: 05/25/2015 18:26    Assessment/Plan: Diagnosis: debility after acute  respiratory failure, ?gouty arthritis in ankles? 1. Does the need for close, 24 hr/day medical supervision in concert with the patient's rehab needs make it unreasonable for this patient to be served in a less intensive setting? Yes 2. Co-Morbidities requiring supervision/potential complications: htn, afib, morbid obesity 3. Due to bladder management, bowel management, safety, skin/wound care, disease management, medication administration, pain management and patient education, does the patient require 24 hr/day rehab nursing? Yes 4. Does the patient require coordinated care of a physician, rehab nurse, PT (1-2 hrs/day, 5 days/week) and OT (1-2 hrs/day, 5 days/week) to address physical and functional deficits in the context of the above medical diagnosis(es)? Yes Addressing deficits in the following areas: balance, endurance, locomotion, strength, transferring, bowel/bladder control, bathing, dressing, feeding, grooming, toileting, cognition and psychosocial support 5. Can the patient actively participate in an intensive therapy program of at least 3 hrs of therapy per day at least 5 days per week? Yes 6. The potential for patient to make measurable gains while on inpatient rehab is good 7. Anticipated functional outcomes upon discharge from inpatient rehab are supervision  with PT, supervision and min assist with OT, n/a with SLP. 8. Estimated rehab length of stay to reach the above functional goals is: 7-10 days 9. Does the patient have adequate social supports and living environment to accommodate these discharge functional goals? Yes 10. Anticipated D/C setting: Home 11. Anticipated post D/C treatments: HH therapy and Outpatient therapy 12. Overall Rehab/Functional Prognosis: good  RECOMMENDATIONS: This patient's condition is appropriate for continued rehabilitative care in the following setting: CIR Patient has agreed to participate in recommended program. Yes Note that insurance prior  authorization may be required for reimbursement for recommended care.  Comment: Rehab Admissions Coordinator to follow up.  Thanks,  Ranelle Oyster, MD, [place] [facility]     05/26/2015

## 2015-05-26 NOTE — Progress Notes (Signed)
Physical Therapy Treatment Patient Details Name: Ryan Macdonald MRN: [id number] DOB: [birth year] Today's Date: 05/26/2015    History of Present Illness Patient is a 81 yo male admitted 05/22/15 with acute respiratory failure with hypoxia, unresponsive and intubated in ED.  Patient extubated 05/24/15.  PMH:  CHF, NICM, EF 25-30%, PAF, HTN, DM, obesity    PT Comments    Pt progressing with mobility but still significantly limited by pain in ankles. Of note, pt reports that he takes daily med for gout that he has not been taking while hospitalized. Required +2 mod A for sit to stand and +2 min to transfer to chair, unable to ambulate due to pain. Pt very motivated and would likely progress well in inpt rehab setting, request screen.     Follow Up Recommendations  CIR;Supervision/Assistance - 24 hour     Equipment Recommendations  None recommended by PT    Recommendations for Other Services Rehab consult     Precautions / Restrictions Precautions Precautions: Fall Restrictions Weight Bearing Restrictions: No    Mobility  Bed Mobility Overal bed mobility: Needs Assistance Bed Mobility: Supine to Sit     Supine to sit: Min assist     General bed mobility comments: pt required rail and HOB elevated as well as min A to trunk to elevate self from bed. Increased time needed as well  Transfers Overall transfer level: Needs assistance Equipment used: Rolling walker (2 wheeled) Transfers: Sit to/from [employer] Sit to Stand: +2 physical assistance;Mod assist Stand pivot transfers: +2 physical assistance;Min assist       General transfer comment: Verbal cues for hand placement, assist to rise and to control descent to chair. Pt required vc's for stepping feet within RW, had great difficulty lifting right foot due to increased left ankle pain. Heavy reliance through UE's on RW  Ambulation/Gait             General Gait Details: pt unable to due to bilateral  ankle pain, L>R   Stairs            Wheelchair Mobility    Modified Rankin (Stroke Patients Only)       Balance Overall balance assessment: Needs assistance Sitting-balance support: Single extremity supported;Feet supported Sitting balance-Leahy Scale: Fair Sitting balance - Comments: able to sit without support   Standing balance support: Bilateral upper extremity supported Standing balance-Leahy Scale: Poor Standing balance comment: unable to maintain standing without UE assist                    Cognition Arousal/Alertness: Awake/alert Behavior During Therapy: WFL for tasks assessed/performed Overall Cognitive Status: Within Functional Limits for tasks assessed                      Exercises General Exercises - Lower Extremity Ankle Circles/Pumps: AROM;Both;10 reps;Supine;Seated    General Comments General comments (skin integrity, edema, etc.): HR and O2 sats stable throughout.       Pertinent Vitals/Pain Pain Assessment: Faces Pain Score: 6  Faces Pain Scale: Hurts even more Pain Location: bilateral ankles and left shoulder Pain Descriptors / Indicators: Aching Pain Intervention(s): Patient requesting pain meds-RN notified;Limited activity within patient's tolerance;Monitored during session  HR 95 bpm  O2 sats 95% on RA    Home Living Family/patient expects to be discharged to:: Private residence Living Arrangements: Spouse/significant other Available Help at Discharge: Family;Available 24 hours/day Type of Home: Apartment Home Access: Stairs to enter Entrance Stairs-Rails: Right Home  Layout: One level Home Equipment: Walker - 2 wheels;Cane - single point;Tub bench      Prior Function Level of Independence: Independent      Comments: Drives   PT Goals (current goals can now be found in the care plan section) Acute Rehab PT Goals Patient Stated Goal: To get stronger PT Goal Formulation: With patient/family Time For Goal  Achievement: 06/01/15 Potential to Achieve Goals: Good Progress towards PT goals: Progressing toward goals    Frequency  Min 3X/week    PT Plan Discharge plan needs to be updated    Co-evaluation PT/OT/SLP Co-Evaluation/Treatment: Yes Reason for Co-Treatment: For patient/therapist safety;Complexity of the patient's impairments (multi-system involvement) PT goals addressed during session: Mobility/safety with mobility;Proper use of DME;Strengthening/ROM;Balance OT goals addressed during session: ADL's and self-care     End of Session Equipment Utilized During Treatment: Gait belt Activity Tolerance: Patient limited by pain Patient left: in chair;with call bell/phone within reach;with family/visitor present     Time: 1022-1058 PT Time Calculation (min) (ACUTE ONLY): 36 min  Charges:  $Therapeutic Activity: 8-22 mins                    G Codes:      Lyanne Co, PT  Acute Rehab Services  [phone]  Lyanne Co 05/26/2015, 12:07 PM

## 2015-05-26 NOTE — Progress Notes (Signed)
*  PRELIMINARY RESULTS* Echocardiogram 2D Echocardiogram has been performed.  Jeryl Columbia 05/26/2015, 12:21 PM

## 2015-05-26 NOTE — Evaluation (Signed)
Occupational Therapy Evaluation Patient Details Name: Ryan Macdonald MRN: [id number] DOB: 03-Feb-1934 Today's Date: 05/26/2015    History of Present Illness Patient is a 81 yo male admitted 05/22/15 with acute respiratory failure with hypoxia, unresponsive and intubated in ED.  Patient extubated 05/24/15.  PMH:  CHF, NICM, EF 25-30%, PAF, HTN, DM, obesity   Clinical Impression   Pt was functioning independently prior to admission. Presents with painful feet and L shoulder.  Pt with a hx of gout per report and regularly takes medication when at home.  Pt currently requires +2 assist for OOB and was only able to transfer to chair, unable to ambulate due to pain and weakness.  He is dependent in all standing ADL. 02 sats were 95% on RA. Will follow acutely.   Follow Up Recommendations  CIR;Supervision/Assistance - 24 hour    Equipment Recommendations  3 in 1 bedside comode    Recommendations for Other Services Rehab consult     Precautions / Restrictions Precautions Precautions: Fall Restrictions Weight Bearing Restrictions: No      Mobility Bed Mobility Overal bed mobility: Needs Assistance Bed Mobility: Supine to Sit     Supine to sit: Min assist     General bed mobility comments: heavy reliance on rail, painful R hand interfering, assist to raise trunk  Transfers Overall transfer level: Needs assistance Equipment used: Rolling walker (2 wheeled) Transfers: Sit to/from [employer] Sit to Stand: +2 physical assistance;Mod assist Stand pivot transfers: +2 physical assistance;Min assist       General transfer comment: Verbal cues for hand placement, assist to rise and to control descent to chair    Balance Overall balance assessment: Needs assistance   Sitting balance-Leahy Scale: Fair Sitting balance - Comments: able to sit without support   Standing balance support: Bilateral upper extremity supported Standing balance-Leahy Scale: Poor Standing  balance comment: unable to release walker                            ADL Overall ADL's : Needs assistance/impaired Eating/Feeding: Independent;Sitting   Grooming: Set up;Sitting   Upper Body Bathing: Minimal assitance;Sitting   Lower Body Bathing: Total assistance;Sit to/from stand;+2 for physical assistance   Upper Body Dressing : Set up;Sitting   Lower Body Dressing: +2 for physical assistance;Total assistance;Sit to/from stand   Toilet Transfer: +2 for safety/equipment;Minimal assistance;Stand-pivot;RW Toilet Transfer Details (indicate cue type and reason): simulated bed to chair with RW Toileting- Clothing Manipulation and Hygiene: +2 for physical assistance;Total assistance;Sit to/from stand               Vision     Perception     Praxis      Pertinent Vitals/Pain Pain Assessment: 0-10 Pain Score: 6  Pain Location: B feet Pain Descriptors / Indicators: Aching Pain Intervention(s): Limited activity within patient's tolerance;Monitored during session;Repositioned;RN gave pain meds during session;Patient requesting pain meds-RN notified     Hand Dominance Right   Extremity/Trunk Assessment Upper Extremity Assessment Upper Extremity Assessment: RUE deficits/detail;LUE deficits/detail RUE Deficits / Details: 100 degrees shoulder FF, painful hand due to IV  LUE Deficits / Details: c/o pain in shoulder, but ROM WFL   Lower Extremity Assessment Lower Extremity Assessment: Defer to PT evaluation       Communication Communication Communication: No difficulties   Cognition Arousal/Alertness: Awake/alert Behavior During Therapy: WFL for tasks assessed/performed Overall Cognitive Status: Within Functional Limits for tasks assessed  General Comments       Exercises       Shoulder Instructions      Home Living Family/patient expects to be discharged to:: Private residence Living Arrangements: Spouse/significant  other Available Help at Discharge: Family;Available 24 hours/day Type of Home: Apartment Home Access: Stairs to enter Entrance Stairs-Number of Steps: 4+4 Entrance Stairs-Rails: Right Home Layout: One level     Bathroom Shower/Tub: Chief Strategy Officer: Standard     Home Equipment: Environmental consultant - 2 wheels;Cane - single point;Tub bench          Prior Functioning/Environment Level of Independence: Independent        Comments: Drives    OT Diagnosis: Generalized weakness;Acute pain   OT Problem List: Decreased strength;Decreased activity tolerance;Impaired balance (sitting and/or standing);Decreased knowledge of use of DME or AE;Obesity;Pain   OT Treatment/Interventions: Self-care/ADL training;Therapeutic activities;Patient/family education;Balance training;DME and/or AE instruction    OT Goals(Current goals can be found in the care plan section) Acute Rehab OT Goals Patient Stated Goal: To get stronger OT Goal Formulation: With patient Time For Goal Achievement: 06/09/15 Potential to Achieve Goals: Good ADL Goals Pt Will Perform Grooming: with supervision;standing Pt Will Perform Lower Body Bathing: with supervision;sit to/from stand Pt Will Perform Lower Body Dressing: with supervision;sit to/from stand Pt Will Transfer to Toilet: with supervision;ambulating;bedside commode Pt Will Perform Toileting - Clothing Manipulation and hygiene: with supervision;sit to/from stand Pt Will Perform Tub/Shower Transfer: Tub transfer;with supervision;tub bench;rolling walker  OT Frequency: Min 2X/week   Barriers to D/C:            Co-evaluation PT/OT/SLP Co-Evaluation/Treatment: Yes Reason for Co-Treatment: For patient/therapist safety   OT goals addressed during session: ADL's and self-care      End of Session Equipment Utilized During Treatment: Gait belt;Rolling walker Nurse Communication: Mobility status  Activity Tolerance: Patient limited by pain Patient  left: in chair;with call bell/phone within reach   Time: 1021-1056 OT Time Calculation (min): 35 min Charges:  OT General Charges $OT Visit: 1 Procedure OT Evaluation $Initial OT Evaluation Tier I: 1 Procedure G-Codes:    Evern Bio 05/26/2015, 11:22 AM  [phone]

## 2015-05-26 NOTE — Progress Notes (Signed)
Rehab Admissions Coordinator Note:  Patient was screened by Clyde Upshaw L for appropriateness for an Inpatient Acute Rehab Consult.  At this time, we are recommending Inpatient Rehab consult.  Ady Heimann L 05/26/2015, 11:38 AM  I can be reached at [phone].

## 2015-05-26 NOTE — Progress Notes (Signed)
Received message to contact the [facility] Care in the [facility] tele # [phone] ask for KG; number called, information given as requested. [facility] called, message left with Selinda Michaels tele # [phone] ext [phone] of patient's hospitalization stay. [name] [facility] [phone]

## 2015-05-27 DIAGNOSIS — I2699 Other pulmonary embolism without acute cor pulmonale: Secondary | ICD-10-CM

## 2015-05-27 LAB — BASIC METABOLIC PANEL
Anion gap: 9 (ref 5–15)
BUN: 42 mg/dL — ABNORMAL HIGH (ref 6–20)
CHLORIDE: 95 mmol/L — AB (ref 101–111)
CO2: 30 mmol/L (ref 22–32)
CREATININE: 1.63 mg/dL — AB (ref 0.61–1.24)
Calcium: 9.2 mg/dL (ref 8.9–10.3)
GFR calc Af Amer: 45 mL/min — ABNORMAL LOW (ref >60)
GFR calc non Af Amer: 39 mL/min — ABNORMAL LOW (ref >60)
Glucose, Bld: 131 mg/dL — ABNORMAL HIGH (ref 65–99)
Potassium: 4.2 mmol/L (ref 3.5–5.1)
Sodium: 134 mmol/L — ABNORMAL LOW (ref 135–145)

## 2015-05-27 LAB — HEMOGLOBIN A1C
Hgb A1c MFr Bld: 6.9 % — ABNORMAL HIGH (ref 4.8–5.6)
MEAN PLASMA GLUCOSE: 151 mg/dL

## 2015-05-27 LAB — GLUCOSE, CAPILLARY
GLUCOSE-CAPILLARY: 150 mg/dL — AB (ref 65–99)
Glucose-Capillary: 123 mg/dL — ABNORMAL HIGH (ref 65–99)

## 2015-05-27 MED ORDER — FUROSEMIDE 40 MG PO TABS: 40.0000 mg | ORAL_TABLET | Freq: Every day | ORAL | Status: DC

## 2015-05-27 MED ORDER — SPIRONOLACTONE 25 MG PO TABS: 12.5000 mg | ORAL_TABLET | Freq: Every day | ORAL | Status: DC

## 2015-05-27 NOTE — Progress Notes (Signed)
Pt. Pulling at IV tubing with IV abx. Running. Pt. D/c'd IV. IV RN notified to have IV reinserted. Pt. Also removing telemetry leads and refusing to let staff replace them. Pt. Educated and encouraged to have leads replaced and still refusing. CCMD notified. RN will attempt to replace leads at later time.

## 2015-05-27 NOTE — Clinical Social Work Note (Signed)
Clinical Social Work Assessment  Patient Details  Name: Ryan Macdonald MRN: [id number] Date of Birth: 10/23/1934  Date of referral:  05/27/15               Reason for consult:  Facility Placement              Housing/Transportation Living arrangements for the past 2 months:  Apartment Source of Information:  Patient, Spouse Patient Interpreter Needed:  None Criminal Activity/Legal Involvement Pertinent to Current Situation/Hospitalization:  No - Comment as needed Significant Relationships:  Adult Children, Spouse Lives with:  Spouse Do you feel safe going back to the place where you live?  Yes Need for family participation in patient care:  Yes (Comment)  Care giving concerns:  Pt's wife Florian Buff reported that she can not provided the care in which the pt will need.    Social Worker assessment / plan:  CSW met the pt and the pt's wife [name] at bedside. CSW introduced self and purpose of the visit. CSW discussed SNF rehab. CSW explained the SNF process. CSW explained insurance and its relation to SNF placement. CSW provided the pt with a SNF list. CSW and pt discussed geographic location in which the she would like to receive rehab. The pt reported that he will like to remain in [facility]. Pt inquired about [place] SNFs. CSW also provided the pt with a list for [facility] SNF. Pt and [name] reviewed the list. [name] explained to that she would like him to go to [facility]. The pt agreed. CSW answered all questions in which the pt inquired about. CSW will continue to follow this pt and assist with discharge as needed.   Employment status:  Retired Forensic scientist:  Medicare PT Recommendations:  Inpatient [place] / Referral to community resources:  [place]  Patient/Family's Response to care:  Pt reported that the staff has been professional and nice.   Patient/Family's Understanding of and Emotional Response to Diagnosis, Current Treatment, and Prognosis:   Pt acknowledged his current condition. Pt reported that he would like to give thanks to God for keeping him alive. Pt reported that he was scared because he did not know if he was going to die.    Emotional Assessment Appearance:  Appears stated age Attitude/Demeanor/Rapport:   (Pleasant ) Affect (typically observed):  Appropriate, Accepting Orientation:  Oriented to Self, Oriented to Place, Oriented to  Time, Oriented to Situation Alcohol / Substance use:  Not Applicable Psych involvement (Current and /or in the community):  No (Comment)  Discharge Needs  Concerns to be addressed:  Denies Needs/Concerns at this time Readmission within the last 30 days:  No Current discharge risk:  None Barriers to Discharge:  No Barriers Identified   Takeru Bose, LCSW 05/27/2015, 12:57 PM

## 2015-05-27 NOTE — Progress Notes (Signed)
Able to get pt's wife Obada Sheils on the phone , informing her that her husband had left his personal items in security, that need to be picked and had tried earlier no one had answered.  Instructed that she will pick them up tomorrow and notified tonite charge nurse and gave him personal items belongings paper.  Amanda Pea, Charity fundraiser.

## 2015-05-27 NOTE — Care Management (Signed)
Important Message  Patient Details  Name: Ryan Macdonald MRN: [id number] Date of Birth: 1934/10/02   Medicare Important Message Given:  Yes-second notification given    Yvonna Alanis 05/27/2015, 1:24 PM

## 2015-05-27 NOTE — Progress Notes (Signed)
I met with pt to discuss his Medicare and VA coverage for CIR vs SNF. I recommend SNF rehab due to [facility] coverage issues. Pt is in agreement. RN CM and SW are aware. We will sign off. [phone]

## 2015-05-27 NOTE — Progress Notes (Signed)
Pt. Refusing to go for CXR. Time rescheduled.

## 2015-05-27 NOTE — Clinical Social Work Placement (Signed)
   CLINICAL SOCIAL WORK PLACEMENT  NOTE  Date:  05/27/2015  Patient Details  Name: Ryan Macdonald MRN: [id number] Date of Birth: [birth year]  Clinical Social Work is seeking post-discharge placement for this patient at the Skilled  Nursing Facility level of care (*CSW will initial, date and re-position this form in  chart as items are completed):  Yes   Patient/family provided with [place] Clinical Social Work Department's list of facilities offering this level of care within the geographic area requested by the patient (or if unable, by the patient's family).  Yes   Patient/family informed of their freedom to choose among providers that offer the needed level of care, that participate in Medicare, Medicaid or managed care program needed by the patient, have an available bed and are willing to accept the patient.  Yes   Patient/family informed of [facility]'s ownership interest in [facility] and [facility], as well as of the fact that they are under no obligation to receive care at these facilities.  PASRR submitted to EDS on 05/27/15     PASRR number received on 05/27/15     Existing PASRR number confirmed on       FL2 transmitted to all facilities in geographic area requested by pt/family on 05/27/15     FL2 transmitted to all facilities within larger geographic area on       Patient informed that his/her managed care company has contracts with or will negotiate with certain facilities, including the following:        Yes   Patient/family informed of bed offers received.  Patient chooses bed at [facility]     Physician recommends and patient chooses bed at      Patient to be transferred to [facility] on 05/27/15.  Patient to be transferred to facility by Gwynneth Macleod, wife      Patient family notified on 05/27/15 of transfer.  Name of family member notified:  Ryan Macdonald, wife      PHYSICIAN       Additional Comment:     _______________________________________________ Gwynne Edinger, LCSW 05/27/2015, 1:01 PM

## 2015-05-27 NOTE — Progress Notes (Signed)
Report called to [facility] place. Spoke with Alinda Money.

## 2015-05-27 NOTE — Clinical Social Work Note (Signed)
CSW informed by bedside RN that the pt's is now requesting [facility] transport. Transport has been arranged. The pt, family and bedside RN is aware.   Ryan Macdonald, MSW, LCSWA [phone]

## 2015-05-27 NOTE — Progress Notes (Signed)
Pt d/c off floor via stretcher to ambulance to [facility]. Pt's wife stated she will meet them there.  Amanda Pea, Charity fundraiser.

## 2015-05-27 NOTE — Discharge Summary (Addendum)
Physician Discharge Summary  Tullio Chausse ION:[id number] DOB: 1934-10-17 DOA: 05/22/2015  PCP: [facility]  Admit date: 05/22/2015 Discharge date: 05/27/2015  Time spent: 40 minutes  Recommendations for Outpatient Follow-up:  1. Follow-up with nursing home M.D. 2. CBC and BMP in 3-4 days, follow creatinine. 3. Lasix and Aldactone dose decreased in half, please follow closely if patient developed fluid overload, restart previous home dose of Lasix 40 mg twice a day and Aldactone 25 mg.  Discharge Diagnoses:  Active Problems:   Hypoxia   Acute respiratory failure, unspecified whether with hypoxia or hypercapnia   Acute respiratory failure   Discharge Condition: Stable  Diet recommendation: Heart healthy  Filed Weights   05/25/15 1134 05/26/15 0526 05/27/15 0547  Weight: 118.5 kg (261 lb 3.9 oz) 117.885 kg (259 lb 14.2 oz) 116.5 kg (256 lb 13.4 oz)    History of present illness:  Ryan Macdonald is a 81 yo male admitted to the ED via EMS 6/23 with a previous medical history significant for HFrEF with ejection fraction of 25-30% estimated in 2014. No family is available and pt is mechanically ventilated what we know at this point is Mr. Nicholson became SOB and notified EMS. In route to [facility] ED patient became increasingly hypoxic, bradycardic and unresponsive. He was immediately intubated on arrival to ED. PCCM was consulted to manage this patients care. On arrival propofol drip was turned off and patient woke up within 5 minutes and was able to follow commands. However, due to vent dyssynchrony he was sedated for comfort. His chest radiograph reveals diffuse patchy infiltrates concerning for pulmonary edema. We continue to work up patient for myocardial ischemia and treat apparent volume overload in the setting of his reduced cardiac function  Hospital Course:    Acute hypoxic and hypercarbic respiratory failure Felt to have been due to pulmonary edema - essentially  resolved - wean to room air and follow. Still on 2 L of oxygen, does not use oxygen at home.  Pulmonary edema - acute decompensated systolic CHF Improvement of the LVEF of 35-40% with diffuse hypokinesis. Previous LVEF was 25-30% in 2014. Patient presented to the hospital with shortness of breath, hypoxia intubated. Successful diuresis with IV Lasix, successful removal of 5.4 L. Weight was 267 on admission, on discharge is 256. Discharged on same home medications, instructed to avoid salt and restrict fluids. Patient is on Coreg, Lasix, Aldactone, hydralazine/Isochron. No ACEI or ARB because of acute kidney injury.  LLL airspace disease - ?aspiration  Pro calcitonin and clinical exam not convincing, antibiotics discontinued, continues to be afebrile.  Hx Afib postop 2014 (THA) patient refuses anticoagulation other than aspirin at this time due to a prior episode of gross hematuria This patients CHA2DS2-VASc Score and unadjusted Ischemic Stroke Rate (% per year) is equal to 9.7 % stroke rate/year from a score of 6 for CHF, HTN, DM, vascular and age >64. Above score calculated as 1 point each if present [CHF, HTN, DM, Vascular=MI/PAD/Aortic Plaque, Age if 65-74, or Male] Above score calculated as 2 points each if present [Age > 75, or Stroke/TIA/TE]  Acute kidney injury Baseline creatinine appears to be approximately 1.2 from February 2016. This is could be secondary to diuresis, Lasix was 40 mg twice a day decreased to 40 mg once a day. Aldactone decrease to 12.5 mg daily, follow renal function closely.   Normocytic Anemia  Baseline actually appears to be approximately 10 - hemoglobin presently higher than baseline - check anemia panel -  no obvious blood loss - follow  DM Hemoglobin A1c 6.9, patient was controlling his diabetes with diet. Continue carbohydrate modified diet.  HLD Check lipid panel in am - cont home dose of Zocor   Obesity - Body mass index is 40.69  kg/(m^2).    Procedures:  Intubation on fields and mechanical ventilation.  Next patient on 05/24/15.  Consultations:  None  Discharge Exam: Filed Vitals:   05/27/15 1032  BP: 116/88  Pulse: 88  Temp: 97.7 F (36.5 C)  Resp: 18   General: Alert and awake, oriented x3, not in any acute distress. HEENT: anicteric sclera, pupils reactive to light and accommodation, EOMI CVS: S1-S2 clear, no murmur rubs or gallops Chest: clear to auscultation bilaterally, no wheezing, rales or rhonchi Abdomen: soft nontender, nondistended, normal bowel sounds, no organomegaly Extremities: no cyanosis, clubbing or edema noted bilaterally Neuro: Cranial nerves II-XII intact, no focal neurological deficits  Discharge Instructions   Discharge Instructions    Diet - low sodium heart healthy    Complete by:  As directed      Increase activity slowly    Complete by:  As directed           Current Discharge Medication List    CONTINUE these medications which have CHANGED   Details  furosemide (LASIX) 40 MG tablet Take 1 tablet (40 mg total) by mouth daily.    spironolactone (ALDACTONE) 25 MG tablet Take 0.5 tablets (12.5 mg total) by mouth daily.      CONTINUE these medications which have NOT CHANGED   Details  HYDROcodone-acetaminophen (NORCO/VICODIN) 5-325 MG per tablet Take 1 tablet by mouth every 8 (eight) hours as needed for moderate pain.    prednisoLONE acetate (PRED MILD) 0.12 % ophthalmic suspension Place 1 drop into the right eye 2 (two) times daily.    PRESCRIPTION MEDICATION Place 1 drop into the right eye 2 (two) times daily.    simvastatin (ZOCOR) 80 MG tablet Take 40 mg by mouth daily as needed (Per Dr orders).     traMADol (ULTRAM) 50 MG tablet Take 50 mg by mouth every 6 (six) hours as needed for moderate pain.     aspirin 81 MG tablet Take 81 mg by mouth daily.    carvedilol (COREG) 25 MG tablet Take 25 mg by mouth 2 (two) times daily with a meal.     cyanocobalamin 1000 MCG tablet Take 2,000 mcg by mouth daily.    hydrALAZINE (APRESOLINE) 10 MG tablet Take 20 mg by mouth 2 (two) times daily.    isosorbide dinitrate (ISOCHRON) 40 MG CR tablet Take 40 mg by mouth 3 (three) times daily.    niacin 500 MG tablet Take 500 mg by mouth at bedtime as needed (Per dr orders).    tamsulosin (FLOMAX) 0.4 MG CAPS capsule Take 0.4 mg by mouth at bedtime.       Allergies  Allergen Reactions  . Lisinopril Swelling      The results of significant diagnostics from this hospitalization (including imaging, microbiology, ancillary and laboratory) are listed below for reference.    Significant Diagnostic Studies: Ct Angio Chest Pe W/cm &/or Wo Cm  05/25/2015   CLINICAL DATA:  Known shortness of breath with hypoxia, known history of CHF  EXAM: CT ANGIOGRAPHY CHEST WITH CONTRAST  TECHNIQUE: Multidetector CT imaging of the chest was performed using the standard protocol during bolus administration of intravenous contrast. Multiplanar CT image reconstructions and MIPs were obtained to evaluate the vascular anatomy.  CONTRAST:  80mL OMNIPAQUE IOHEXOL 350 MG/ML SOLN  COMPARISON:  None.  FINDINGS: The lungs are well aerated with some very minimal atelectatic changes in the left lower lobe. No focal confluent infiltrate or sizable effusion is noted.  The thoracic inlet is within normal limits. The thoracic aorta demonstrates calcific changes without aneurysmal dilatation or dissection. The pulmonary artery is well visualized and demonstrates a normal branching pattern. No intraluminal filling defect to suggest pulmonary embolism is identified. No hilar or mediastinal adenopathy is seen. Heavy coronary calcifications are noted. The larger of left ventricle is noted with diffuse ventricular wall thinning consistent with the patient's given clinical history of reduced ejection fraction and chronic CHF.  The upper abdomen shows evidence of right-sided staghorn calculus.  No other focal abnormality is noted in the upper abdomen. The osseous structures show degenerative change of the thoracic spine.  Review of the MIP images confirms the above findings.  IMPRESSION: No evidence of pulmonary emboli.  Changes consistent with the patient's given clinical history of cardiomyopathy  Minimal left basilar atelectasis.  Staghorn calculus within the right kidney.  Atherosclerotic changes within the thoracic aorta.   Electronically Signed   By: Alcide Clever M.D.   On: 05/25/2015 18:26   Dg Chest Port 1 View  05/24/2015   CLINICAL DATA:  Acute respiratory failure  EXAM: PORTABLE CHEST - 1 VIEW  COMPARISON:  05/23/2015  FINDINGS: Endotracheal tube and nasogastric catheter are again identified and stable in appearance. There is improved aeration in the left lung when compare with the prior study. No acute bony abnormality is noted. No new focal infiltrate is seen.  IMPRESSION: Improved aeration the left lung when compared with previous exam.  Tubes and lines as described.   Electronically Signed   By: Alcide Clever M.D.   On: 05/24/2015 08:16   Dg Chest Port 1 View  05/23/2015   CLINICAL DATA:  Intubation.  EXAM: PORTABLE CHEST - 1 VIEW  COMPARISON:  None.  FINDINGS: Endotracheal tube tip 2.3 cm above the carina. Stable cardiomegaly with normal pulmonary vascularity . Left lower lobe infiltrate consistent pneumonia again noted. No pleural effusion or pneumothorax.  IMPRESSION: 1. Endotracheal tube and NG tube in good anatomic position. 2. Persistent left lung infiltrate consistent pneumonia. 3. Stable cardiomegaly   Electronically Signed   By: Maisie Fus  Register   On: 05/23/2015 07:25   Dg Chest Portable 1 View  05/22/2015   CLINICAL DATA:  Intubation.  Shortness of breath.  EXAM: PORTABLE CHEST - 1 VIEW  COMPARISON:  01/22/2015  FINDINGS: An endotracheal tube terminates approximately 1.5 cm above the carina. Cardiac silhouette appears mildly enlarged. There is mild pulmonary vascular  congestion. There is abnormal airspace opacity throughout much of the left lung. No pleural effusion or pneumothorax is identified. No acute osseous abnormality is seen.  IMPRESSION: 1. Endotracheal tube approximately 1.5 cm above the carina. Consider slight retraction. 2. Left lung airspace opacities, possibly representing pneumonia.   Electronically Signed   By: Sebastian Ache   On: 05/22/2015 14:09    Microbiology: Recent Results (from the past 240 hour(s))  MRSA PCR Screening     Status: None   Collection Time: 05/22/15  4:02 PM  Result Value Ref Range Status   MRSA by PCR NEGATIVE NEGATIVE Final    Comment:        The GeneXpert MRSA Assay (FDA approved for NASAL specimens only), is one component of a comprehensive MRSA colonization surveillance program. It is not intended  to diagnose MRSA infection nor to guide or monitor treatment for MRSA infections.   Culture, blood (routine x 2)     Status: None (Preliminary result)   Collection Time: 05/23/15  7:44 PM  Result Value Ref Range Status   Specimen Description BLOOD RIGHT HAND  Final   Special Requests   Final    BOTTLES DRAWN AEROBIC AND ANAEROBIC BLUE 10CC RED 9CC   Culture NO GROWTH 4 DAYS  Final   Report Status PENDING  Incomplete  Culture, blood (routine x 2)     Status: None (Preliminary result)   Collection Time: 05/23/15  7:50 PM  Result Value Ref Range Status   Specimen Description BLOOD LEFT HAND  Final   Special Requests BOTTLES DRAWN AEROBIC AND ANAEROBIC 10CC  Final   Culture NO GROWTH 4 DAYS  Final   Report Status PENDING  Incomplete     Labs: Basic Metabolic Panel:  Recent Labs Lab 05/23/15 0240 05/24/15 0235 05/25/15 0228 05/26/15 0409 05/27/15 0548  NA 141 142 139 137 134*  K 3.5 3.7 4.1 4.5 4.2  CL 103 102 99* 96* 95*  CO2 GLUCOSE 127* 131* 118* 136* 131*  BUN 19 26* 27* 32* 42*  CREATININE 1.23 1.50* 1.33* 1.58* 1.63*  CALCIUM 9.5 9.6 9.0 9.5 9.2  MG 1.6* 1.7 1.9  --   --    PHOS 2.2* 2.5  --   --   --    Liver Function Tests:  Recent Labs Lab 05/22/15 1327 05/26/15 0409  AST 33 23  ALT 24 18  ALKPHOS 55 56  BILITOT 0.6 0.9  PROT 7.6 7.9  ALBUMIN 3.2* 3.1*   No results for input(s): LIPASE, AMYLASE in the last 168 hours. No results for input(s): AMMONIA in the last 168 hours. CBC:  Recent Labs Lab 05/22/15 1327 05/22/15 1354 05/22/15 1640 05/23/15 0240 05/26/15 0409  WBC 11.1*  --  9.6 9.7 11.8*  NEUTROABS 5.6  --   --   --   --   HGB 12.5* 14.3 11.2* 11.4* 11.1*  HCT 38.3* 42.0 33.8* 34.2* 34.2*  MCV 97.0  --  95.8 95.5 95.3  PLT 251  --  215 229 269   Cardiac Enzymes:  Recent Labs Lab 05/22/15 1640 05/22/15 2055 05/23/15 0240  TROPONINI 0.08* 0.09* 0.09*   BNP: BNP (last 3 results)  Recent Labs  01/22/15 1244  BNP 206.4*    ProBNP (last 3 results) No results for input(s): PROBNP in the last 8760 hours.  CBG:  Recent Labs Lab 05/26/15 1127 05/26/15 1612 05/26/15 2125 05/27/15 0610 05/27/15 1132  GLUCAP 154* 143* 129* 123* 150*       Signed:  Makael Stein A  [facility] 05/27/2015, 1:12 PM

## 2015-05-28 LAB — CULTURE, BLOOD (ROUTINE X 2)
CULTURE: NO GROWTH
Culture: NO GROWTH

## 2015-05-29 ENCOUNTER — Non-Acute Institutional Stay (SKILLED_NURSING_FACILITY): Payer: Self-pay | Admitting: Internal Medicine

## 2015-05-29 DIAGNOSIS — R059 Cough, unspecified: Secondary | ICD-10-CM

## 2015-05-29 DIAGNOSIS — M159 Polyosteoarthritis, unspecified: Secondary | ICD-10-CM

## 2015-05-29 DIAGNOSIS — I5023 Acute on chronic systolic (congestive) heart failure: Secondary | ICD-10-CM

## 2015-05-29 DIAGNOSIS — E785 Hyperlipidemia, unspecified: Secondary | ICD-10-CM

## 2015-05-29 DIAGNOSIS — R5381 Other malaise: Secondary | ICD-10-CM

## 2015-05-29 DIAGNOSIS — E669 Obesity, unspecified: Secondary | ICD-10-CM

## 2015-05-29 DIAGNOSIS — I10 Essential (primary) hypertension: Secondary | ICD-10-CM

## 2015-05-29 DIAGNOSIS — J029 Acute pharyngitis, unspecified: Secondary | ICD-10-CM

## 2015-05-29 DIAGNOSIS — I480 Paroxysmal atrial fibrillation: Secondary | ICD-10-CM

## 2015-05-29 DIAGNOSIS — N400 Enlarged prostate without lower urinary tract symptoms: Secondary | ICD-10-CM

## 2015-05-29 DIAGNOSIS — K5900 Constipation, unspecified: Secondary | ICD-10-CM

## 2015-05-29 DIAGNOSIS — R05 Cough: Secondary | ICD-10-CM

## 2015-05-29 NOTE — Progress Notes (Signed)
Patient ID: Ryan Macdonald, male   DOB: [birth year], 81 y.o.   MRN: [id number]     Facility: [facility]    PCP: [facility]  Code Status: full code  Allergies  Allergen Reactions  . Lisinopril Swelling    Chief Complaint  Patient presents with  . New Admit To SNF     HPI:  81 year old patient is here for short term rehabilitation post hospital admission from 05/22/15-05/27/15 with acute respiratory failure in setting of pulmonary edema. He required intubation and iv diuresis. He has PMH of afib, HTN, chronic systolic heart failure. He is seen in his room today. He complaints of some discomfort in his throat. He has been constipated and has some cough with stuffiness in his nose. Denies any other concerns. Has been working with therapy team and getting around the facility with a walker.  Review of Systems:  Constitutional: Negative for fever, chills, diaphoresis.  HENT: Negative for headache, congestion, nasal discharge Eyes: Negative for eye pain, blurred vision, double vision and discharge.  Respiratory: Negative for shortness of breath and wheezing.   Cardiovascular: Negative for chest pain, palpitations, leg swelling.  Gastrointestinal: Negative for heartburn, nausea, vomiting, abdominal pain Genitourinary: Negative for dysuria Musculoskeletal: Negative for back pain, falls. Uses walker Skin: Negative for itching, rash.  Neurological: Negative for dizziness, tingling, focal weakness Psychiatric/Behavioral: Negative for depression   Past Medical History  Diagnosis Date  . Nonischemic cardiomyopathy     a. ? 2009 Cath in [place] - nl cors per pt;  b. 09/2013 Echo: EF 25-30%, sev diff HK.  Marland Kitchen Hypertension   . Cat scratch fever     removed mass on right side neck  . Cataracts, bilateral   . High cholesterol   . Osteoarthritis   . Kidney stones   . Diabetes mellitus without complication   . Morbid obesity   . PAF (paroxysmal atrial  fibrillation)     a. post-op hip in 2014 - prev on xarelto.  . CHF (congestive heart failure)    Past Surgical History  Procedure Laterality Date  . Kidney stone surgery Right 1970's; 1983    "twice"  . Tonsillectomy and adenoidectomy  1940's  . Cystoscopy with urethral dilatation  10/23/2013  . Total hip arthroplasty Left 10/23/2013  . Inguinal hernia repair Right 1950's  . Circumcision  1940's  . Incision and drainage of wound Right 1980's    "cat scratch" (10/23/2013)  . Cardiac catheterization  1980's  . Total hip arthroplasty Left 10/23/2013    Procedure: TOTAL HIP ARTHROPLASTY;  Surgeon: Valeria Batman, MD;  Location: [facility] OR;  Service: Orthopedics;  Laterality: Left;  . Cystoscopy with urethral dilatation N/A 10/23/2013    Procedure: CYSTOSCOPY WITH URETHRAL DILATATION;  Surgeon: Kathi Ludwig, MD;  Location: [facility] OR;  Service: Urology;  Laterality: N/A;   Social History:   reports that he has quit smoking. His smoking use included Cigarettes and Cigars. He has a 15 pack-year smoking history. He has never used smokeless tobacco. He reports that he does not drink alcohol or use illicit drugs.  Family History  Problem Relation Age of Onset  . Other      negative for premature CAD    Medications: Patient's Medications  New Prescriptions   No medications on file  Previous Medications   ASPIRIN 81 MG TABLET    Take 81 mg by mouth daily.   CARVEDILOL (COREG) 25 MG TABLET  Take 25 mg by mouth 2 (two) times daily with a meal.   CYANOCOBALAMIN 1000 MCG TABLET    Take 2,000 mcg by mouth daily.   FUROSEMIDE (LASIX) 40 MG TABLET    Take 1 tablet (40 mg total) by mouth daily.   HYDRALAZINE (APRESOLINE) 10 MG TABLET    Take 20 mg by mouth 2 (two) times daily.   HYDROCODONE-ACETAMINOPHEN (NORCO/VICODIN) 5-325 MG PER TABLET    Take 1 tablet by mouth every 8 (eight) hours as needed for moderate pain.   ISOSORBIDE DINITRATE (ISOCHRON) 40 MG CR TABLET    Take 40 mg by mouth 3  (three) times daily.   NIACIN 500 MG TABLET    Take 500 mg by mouth at bedtime as needed (Per dr orders).   PREDNISOLONE ACETATE (PRED MILD) 0.12 % OPHTHALMIC SUSPENSION    Place 1 drop into the right eye 2 (two) times daily.   PRESCRIPTION MEDICATION    Place 1 drop into the right eye 2 (two) times daily.   SIMVASTATIN (ZOCOR) 80 MG TABLET    Take 40 mg by mouth daily as needed (Per Dr orders).    SPIRONOLACTONE (ALDACTONE) 25 MG TABLET    Take 0.5 tablets (12.5 mg total) by mouth daily.   TAMSULOSIN (FLOMAX) 0.4 MG CAPS CAPSULE    Take 0.4 mg by mouth at bedtime.   TRAMADOL (ULTRAM) 50 MG TABLET    Take 50 mg by mouth every 6 (six) hours as needed for moderate pain.   Modified Medications   No medications on file  Discontinued Medications   No medications on file     Physical Exam: Filed Vitals:   05/29/15 1804  BP: 136/76  Pulse: 87  Temp: 97.5 F (36.4 C)  Resp: 18  SpO2: 98%    General- elderly male, obese, in no acute distress Head- normocephalic, atraumatic Nose- no maxillary or frontal sinus tenderness, no nasal discharge Throat- moist mucus membrane, no oropharyngeal erythema Eyes- PERRLA, EOMI, no pallor, no icterus, no discharge, normal conjunctiva, normal sclera Neck- no cervical lymphadenopathy, no jugular vein distension Cardiovascular- normal s1,s2, no murmurs, palpable dorsalis pedis, trace ankle edema Respiratory- bilateral clear to auscultation, no wheeze, no rhonchi, no crackles, no use of accessory muscles Abdomen- bowel sounds present, soft, non tender Musculoskeletal- able to move all 4 extremities, generalized weakness, using walker Neurological- no focal deficit Skin- warm and dry Psychiatry- alert and oriented to person, place and time, normal mood and affect    Labs reviewed: Basic Metabolic Panel:  Recent Labs  45/40/98 0240 05/24/15 0235 05/25/15 0228 05/26/15 0409 05/27/15 0548  NA 141 142 139 137 134*  K 3.5 3.7 4.1 4.5 4.2  CL 103  102 99* 96* 95*  CO2 25 27 29 31 30   GLUCOSE 127* 131* 118* 136* 131*  BUN 19 26* 27* 32* 42*  CREATININE 1.23 1.50* 1.33* 1.58* 1.63*  CALCIUM 9.5 9.6 9.0 9.5 9.2  MG 1.6* 1.7 1.9  --   --   PHOS 2.2* 2.5  --   --   --    Liver Function Tests:  Recent Labs  01/22/15 1244 05/22/15 1327 05/26/15 0409  AST 24 33 23  ALT 17 24 18   ALKPHOS 61 55 56  BILITOT 0.8 0.6 0.9  PROT 7.4 7.6 7.9  ALBUMIN 3.2* 3.2* 3.1*   No results for input(s): LIPASE, AMYLASE in the last 8760 hours. No results for input(s): AMMONIA in the last 8760 hours. CBC:  Recent Labs  05/22/15 1327  05/22/15 1640 05/23/15 0240 05/26/15 0409  WBC 11.1*  --  9.6 9.7 11.8*  NEUTROABS 5.6  --   --   --   --   HGB 12.5*  < > 11.2* 11.4* 11.1*  HCT 38.3*  < > 33.8* 34.2* 34.2*  MCV 97.0  --  95.8 95.5 95.3  PLT 251  --  215 229 269  < > = values in this interval not displayed. Cardiac Enzymes:  Recent Labs  05/22/15 1640 05/22/15 2055 05/23/15 0240  TROPONINI 0.08* 0.09* 0.09*   BNP: Invalid input(s): POCBNP CBG:  Recent Labs  05/26/15 2125 05/27/15 0610 05/27/15 1132  GLUCAP 129* 123* 150*     Assessment/Plan  Physical deconditioning Will have him work with physical therapy and occupational therapy team to help with gait training and muscle strengthening exercises.fall precautions. Skin care. Encourage to be out of bed.   Acute on chronic systolic heart failure euvolemic on exam, will need daily weight monitoring. Breathing currently stable. Continue Coreg 25 mg bid, hydralazine 20 mg bid, isosorbide dinitrate 40 mg tid, aldactone 12.5 mg daily and lasix 40 mg daily. Check bmp  Cough With nasal stuffiness, no nasal drainage, start robitussin dm 10 cc tid for 5 days and reassess  Throat soreness Post extubation, start chlorspetic spray tid prn for now and reassess.   Constipation Start colace 100 mg bid with daily miralax for now and reassess  Leukocytosis Afebrile, no signs of  infection, monitor temperature and wbc curve  afib Rate controlled. Continue coreg 25 mg bid and aspirin  HTN Controlled bp reading, continue coreg, hydralazine and isosorbide dinitrate for now, monitor bp  HLD Continue zocor 80 mg daily  BPH Stable, continue flomax 0.4 mg daily  OA Stable pain, continue norco 5-325 1 tab q8h prn with tramadol 50 mg q6h prn for pain and monitor   Goals of care: short term rehabilitation   Labs/tests ordered: cbc, bmp  Family/ staff Communication: reviewed care plan with patient and nursing supervisor    Oneal Grout, MD  [facility] [phone] (Monday-Friday 8 am - 5 pm) [phone] (afterhours)

## 2015-05-30 LAB — CBC AND DIFFERENTIAL
HCT: 33 % — AB (ref 41–53)
HEMOGLOBIN: 11 g/dL — AB (ref 13.5–17.5)
Platelets: 332 10*3/uL (ref 150–399)
WBC: 8.3 10^3/mL

## 2015-05-30 LAB — BASIC METABOLIC PANEL
BUN: 52 mg/dL — AB (ref 4–21)
CREATININE: 1.4 mg/dL — AB (ref 0.6–1.3)
Glucose: 100 mg/dL
POTASSIUM: 4.6 mmol/L (ref 3.4–5.3)
Sodium: 138 mmol/L (ref 137–147)

## 2015-06-06 ENCOUNTER — Other Ambulatory Visit: Payer: Self-pay

## 2015-06-06 MED ORDER — HYDROCODONE-ACETAMINOPHEN 5-325 MG PO TABS: 1.0000 | ORAL_TABLET | Freq: Three times a day (TID) | ORAL | Status: DC | PRN

## 2015-06-06 NOTE — Telephone Encounter (Signed)
Rx faxed to [facility] @ [phone], phone number [phone]  

## 2015-06-09 ENCOUNTER — Encounter: Payer: Self-pay | Admitting: Nurse Practitioner

## 2015-06-09 ENCOUNTER — Non-Acute Institutional Stay (SKILLED_NURSING_FACILITY): Payer: Self-pay | Admitting: Nurse Practitioner

## 2015-06-09 DIAGNOSIS — K5900 Constipation, unspecified: Secondary | ICD-10-CM

## 2015-06-09 DIAGNOSIS — M159 Polyosteoarthritis, unspecified: Secondary | ICD-10-CM

## 2015-06-09 DIAGNOSIS — E785 Hyperlipidemia, unspecified: Secondary | ICD-10-CM

## 2015-06-09 DIAGNOSIS — I480 Paroxysmal atrial fibrillation: Secondary | ICD-10-CM

## 2015-06-09 DIAGNOSIS — N400 Enlarged prostate without lower urinary tract symptoms: Secondary | ICD-10-CM

## 2015-06-09 DIAGNOSIS — R5381 Other malaise: Secondary | ICD-10-CM

## 2015-06-09 DIAGNOSIS — I5023 Acute on chronic systolic (congestive) heart failure: Secondary | ICD-10-CM

## 2015-06-09 DIAGNOSIS — I10 Essential (primary) hypertension: Secondary | ICD-10-CM

## 2015-06-09 NOTE — Progress Notes (Signed)
Patient ID: Ryan Macdonald, male   DOB: 1934-07-18, 81 y.o.   MRN: [id number]    Nursing Home Location:  [facility]   Place of Service: SNF (31)  PCP: [facility]  Allergies  Allergen Reactions  . Lisinopril Swelling    Chief Complaint  Patient presents with  . Discharge Note    Discharge from SNF     HPI:  Patient is a 81 y.o. male seen today at [facility] for discharge home. Pt with a pmh of a fib, CHF, HTN, DM, constipation. Pt is at [facility] place for short term rehabilitation post hospital admission from 05/22/15-05/27/15 with acute respiratory failure in setting of pulmonary edema and required intubation and IV diuresis. Pt reports he is back to baseline. Patient currently doing well with therapy, now stable to discharge home with home health. No shortness of breath, chest pain, or edema.   Review of Systems:  Review of Systems  Constitutional: Negative for activity change, appetite change, fatigue and unexpected weight change.  HENT: Negative for congestion and hearing loss.   Eyes: Negative.   Respiratory: Negative for cough and shortness of breath.   Cardiovascular: Negative for chest pain, palpitations and leg swelling.  Gastrointestinal: Negative for abdominal pain, diarrhea and constipation.  Genitourinary: Negative for dysuria and difficulty urinating.  Musculoskeletal: Negative for myalgias and arthralgias.  Skin: Negative for color change and wound.  Neurological: Negative for dizziness and weakness.  Psychiatric/Behavioral: Negative for behavioral problems, confusion and agitation.    Past Medical History  Diagnosis Date  . Nonischemic cardiomyopathy     a. ? 2009 Cath in [place] - nl cors per pt;  b. 09/2013 Echo: EF 25-30%, sev diff HK.  Marland Kitchen Hypertension   . Cat scratch fever     removed mass on right side neck  . Cataracts, bilateral   . High cholesterol   . Osteoarthritis   . Kidney stones   . Diabetes  mellitus without complication   . Morbid obesity   . PAF (paroxysmal atrial fibrillation)     a. post-op hip in 2014 - prev on xarelto.  . CHF (congestive heart failure)    Past Surgical History  Procedure Laterality Date  . Kidney stone surgery Right 1970's; 1983    "twice"  . Tonsillectomy and adenoidectomy  1940's  . Cystoscopy with urethral dilatation  10/23/2013  . Total hip arthroplasty Left 10/23/2013  . Inguinal hernia repair Right 1950's  . Circumcision  1940's  . Incision and drainage of wound Right 1980's    "cat scratch" (10/23/2013)  . Cardiac catheterization  1980's  . Total hip arthroplasty Left 10/23/2013    Procedure: TOTAL HIP ARTHROPLASTY;  Surgeon: Valeria Batman, MD;  Location: [facility] OR;  Service: Orthopedics;  Laterality: Left;  . Cystoscopy with urethral dilatation N/A 10/23/2013    Procedure: CYSTOSCOPY WITH URETHRAL DILATATION;  Surgeon: Kathi Ludwig, MD;  Location: [facility] OR;  Service: Urology;  Laterality: N/A;   Social History:   reports that he has quit smoking. His smoking use included Cigarettes and Cigars. He has a 15 pack-year smoking history. He has never used smokeless tobacco. He reports that he does not drink alcohol or use illicit drugs.  Family History  Problem Relation Age of Onset  . Other      negative for premature CAD    Medications: Patient's Medications  New Prescriptions   No medications on file  Previous Medications  ACETAMINOPHEN (CHLORASEPTIC SORE THROAT) 167 MG/5ML LIQD    1 spray every 8 hours as needed for sore throat   ASPIRIN 81 MG TABLET    Take 81 mg by mouth daily.   CARVEDILOL (COREG) 25 MG TABLET    Take 25 mg by mouth 2 (two) times daily with a meal.   CYANOCOBALAMIN 1000 MCG TABLET    Take 2,000 mcg by mouth daily.   DOCUSATE SODIUM (COLACE) 100 MG CAPSULE    Take 100 mg by mouth 2 (two) times daily. For constipation   FUROSEMIDE (LASIX) 40 MG TABLET    Take 1 tablet (40 mg total) by mouth daily.    HYDRALAZINE (APRESOLINE) 10 MG TABLET    Take 20 mg by mouth 2 (two) times daily.   HYDROCODONE-ACETAMINOPHEN (NORCO/VICODIN) 5-325 MG PER TABLET    Take 1 tablet by mouth every 8 (eight) hours as needed for moderate pain (do not exceed 4 gm of tylenol in 24 ours).   ISOSORBIDE DINITRATE (ISOCHRON) 40 MG CR TABLET    Take 40 mg by mouth 3 (three) times daily.   NIACIN 500 MG TABLET    Take 500 mg by mouth at bedtime as needed (Per dr orders).   POLYETHYLENE GLYCOL (MIRALAX / GLYCOLAX) PACKET    Take 17 g by mouth daily. For constipation   PREDNISOLONE ACETATE (PRED MILD) 0.12 % OPHTHALMIC SUSPENSION    Place 1 drop into the right eye 2 (two) times daily.   SIMVASTATIN (ZOCOR) 40 MG TABLET    Take 40 mg by mouth daily. For high cholesterol   SPIRONOLACTONE (ALDACTONE) 25 MG TABLET    Take 0.5 tablets (12.5 mg total) by mouth daily.   TAMSULOSIN (FLOMAX) 0.4 MG CAPS CAPSULE    Take 0.4 mg by mouth at bedtime.   TRAMADOL (ULTRAM) 50 MG TABLET    Take 50 mg by mouth every 6 (six) hours as needed for moderate pain.   Modified Medications   No medications on file  Discontinued Medications   PRESCRIPTION MEDICATION    Place 1 drop into the right eye 2 (two) times daily.   SIMVASTATIN (ZOCOR) 80 MG TABLET    Take 40 mg by mouth daily as needed (Per Dr orders).      Physical Exam: Filed Vitals:   06/09/15 1115  BP: 108/51  Pulse: 80  Temp: 98.1 F (36.7 C)  TempSrc: Oral  Resp: 20  Height:  (1.702 m)  Weight: 256 lb (116.121 kg)    Physical Exam  Constitutional: He is oriented to person, place, and time. He appears well-developed and well-nourished. No distress.  HENT:  Head: Normocephalic and atraumatic.  Mouth/Throat: Oropharynx is clear and moist. No oropharyngeal exudate.  Eyes: Conjunctivae and EOM are normal. Pupils are equal, round, and reactive to light.  Neck: Normal range of motion. Neck supple.  Cardiovascular: Normal rate, regular rhythm and normal heart sounds.     Pulmonary/Chest: Effort normal and breath sounds normal.  Abdominal: Soft. Bowel sounds are normal.  Musculoskeletal: He exhibits no edema or tenderness.  Neurological: He is alert and oriented to person, place, and time.  Skin: Skin is warm and dry. He is not diaphoretic.  Psychiatric: He has a normal mood and affect.    Labs reviewed: Basic Metabolic Panel:  Recent Labs  16/10/96 0240 05/24/15 0235 05/25/15 0228 05/26/15 0409 05/27/15 0548 05/30/15  NA 141 142 139 137 134* 138  K 3.5 3.7 4.1 4.5 4.2 4.6  CL 103  102 99* 96* 95*  --   CO2 25 27 29 31 30   --   GLUCOSE 127* 131* 118* 136* 131*  --   BUN 19 26* 27* 32* 42* 52*  CREATININE 1.23 1.50* 1.33* 1.58* 1.63* 1.4*  CALCIUM 9.5 9.6 9.0 9.5 9.2  --   MG 1.6* 1.7 1.9  --   --   --   PHOS 2.2* 2.5  --   --   --   --    Liver Function Tests:  Recent Labs  01/22/15 1244 05/22/15 1327 05/26/15 0409  AST 24 33 23  ALT 17 24 18   ALKPHOS 61 55 56  BILITOT 0.8 0.6 0.9  PROT 7.4 7.6 7.9  ALBUMIN 3.2* 3.2* 3.1*   No results for input(s): LIPASE, AMYLASE in the last 8760 hours. No results for input(s): AMMONIA in the last 8760 hours. CBC:  Recent Labs  05/22/15 1327  05/22/15 1640 05/23/15 0240 05/26/15 0409 05/30/15  WBC 11.1*  --  9.6 9.7 11.8* 8.3  NEUTROABS 5.6  --   --   --   --   --   HGB 12.5*  < > 11.2* 11.4* 11.1* 11.0*  HCT 38.3*  < > 33.8* 34.2* 34.2* 33*  MCV 97.0  --  95.8 95.5 95.3  --   PLT 251  --  215 229 269 332  < > = values in this interval not displayed. TSH: No results for input(s): TSH in the last 8760 hours. A1C: Lab Results  Component Value Date   HGBA1C 6.9* 05/26/2015   Lipid Panel:  Recent Labs  05/26/15 0409  CHOL 159  HDL 35*  LDLCALC 111*  TRIG 66  CHOLHDL 4.5     Assessment/Plan 1. Acute on chronic systolic CHF (congestive heart failure) Acute exacerbation has improved, current stable. euvolemic on exam, conts on Coreg 25 mg bid, hydralazine 20 mg bid,  isosorbide dinitrate 40 mg tid, aldactone 12.5 mg daily and lasix 40 mg daily. Discussed dietary compliance and lifestyle modifications. Pt reports he is aware however states he does not want diet ruling his life.  -home health nursing for ongoing education   2. Paroxysmal atrial fibrillation -rate controlled, conts on coreg 35 mg BID  3. Essential hypertension -blood pressure controlled on current regimen, to continue   4. Hyperlipidemia conts on zocor 80 mg daily   5. Osteoarthritis, generalized Pain is well controlled on norco 5-325 1 tab q8h prn with tramadol 50 mg q6h prn   6. BPH (benign prostatic hyperplasia) Stable, conts on flomax 0.4 mg daily   7. Constipation, unspecified constipation type Improved, conts on colace 100 mg and miralax  8. Physical deconditioning - has improved with therapy. pt is stable for discharge-will need PT/OT/Nursing per home health. No DME needed. Rx written.  will need to follow up with PCP within 2 weeks.      Janene Harvey. Biagio Borg  [facility] [phone] 8 am - 5 pm) [phone] (after hours)

## 2015-06-11 DIAGNOSIS — I10 Essential (primary) hypertension: Secondary | ICD-10-CM

## 2015-06-11 DIAGNOSIS — I509 Heart failure, unspecified: Secondary | ICD-10-CM

## 2015-06-11 DIAGNOSIS — Z7982 Long term (current) use of aspirin: Secondary | ICD-10-CM

## 2015-06-11 DIAGNOSIS — M6281 Muscle weakness (generalized): Secondary | ICD-10-CM

## 2015-06-11 DIAGNOSIS — I4891 Unspecified atrial fibrillation: Secondary | ICD-10-CM

## 2015-06-11 DIAGNOSIS — I2510 Atherosclerotic heart disease of native coronary artery without angina pectoris: Secondary | ICD-10-CM

## 2015-07-17 ENCOUNTER — Other Ambulatory Visit (HOSPITAL_COMMUNITY): Payer: Self-pay

## 2015-07-17 DIAGNOSIS — I509 Heart failure, unspecified: Secondary | ICD-10-CM

## 2015-07-23 ENCOUNTER — Ambulatory Visit (HOSPITAL_COMMUNITY)
Admission: RE | Admit: 2015-07-23 | Discharge: 2015-07-23 | Disposition: A | Discharge status: Home / Self Care | Payer: No Typology Code available for payment source | Source: Ambulatory Visit | Attending: Psychiatry | Admitting: Psychiatry

## 2015-07-23 DIAGNOSIS — Z6841 Body Mass Index (BMI) 40.0 and over, adult: Secondary | ICD-10-CM | POA: Diagnosis not present

## 2015-07-23 DIAGNOSIS — I351 Nonrheumatic aortic (valve) insufficiency: Secondary | ICD-10-CM | POA: Diagnosis not present

## 2015-07-23 DIAGNOSIS — E785 Hyperlipidemia, unspecified: Secondary | ICD-10-CM | POA: Insufficient documentation

## 2015-07-23 DIAGNOSIS — I10 Essential (primary) hypertension: Secondary | ICD-10-CM | POA: Diagnosis not present

## 2015-07-23 DIAGNOSIS — Z87891 Personal history of nicotine dependence: Secondary | ICD-10-CM | POA: Insufficient documentation

## 2015-07-23 DIAGNOSIS — I340 Nonrheumatic mitral (valve) insufficiency: Secondary | ICD-10-CM | POA: Insufficient documentation

## 2015-07-23 DIAGNOSIS — I429 Cardiomyopathy, unspecified: Secondary | ICD-10-CM | POA: Insufficient documentation

## 2015-07-23 DIAGNOSIS — I509 Heart failure, unspecified: Secondary | ICD-10-CM | POA: Insufficient documentation

## 2015-07-23 DIAGNOSIS — I77819 Aortic ectasia, unspecified site: Secondary | ICD-10-CM | POA: Insufficient documentation

## 2015-07-23 NOTE — Progress Notes (Signed)
  Echocardiogram 2D Echocardiogram has been performed.  Doristine Section 07/23/2015, 10:02 AM

## 2015-08-21 ENCOUNTER — Inpatient Hospital Stay (HOSPITAL_COMMUNITY)
Admission: EM | Admit: 2015-08-21 | Discharge: 2015-08-23 | DRG: 291 | Disposition: A | Discharge status: Home / Self Care | Payer: Medicare Other | Attending: Internal Medicine | Admitting: Internal Medicine

## 2015-08-21 ENCOUNTER — Encounter (HOSPITAL_COMMUNITY): Payer: Self-pay | Admitting: Neurology

## 2015-08-21 ENCOUNTER — Emergency Department (HOSPITAL_COMMUNITY): Payer: Medicare Other

## 2015-08-21 ENCOUNTER — Inpatient Hospital Stay (HOSPITAL_COMMUNITY): Payer: Medicare Other

## 2015-08-21 DIAGNOSIS — Y95 Nosocomial condition: Secondary | ICD-10-CM | POA: Diagnosis present

## 2015-08-21 DIAGNOSIS — E780 Pure hypercholesterolemia: Secondary | ICD-10-CM | POA: Diagnosis present

## 2015-08-21 DIAGNOSIS — Z9114 Patient's other noncompliance with medication regimen: Secondary | ICD-10-CM | POA: Diagnosis present

## 2015-08-21 DIAGNOSIS — J189 Pneumonia, unspecified organism: Secondary | ICD-10-CM | POA: Diagnosis present

## 2015-08-21 DIAGNOSIS — N183 Chronic kidney disease, stage 3 unspecified: Secondary | ICD-10-CM | POA: Insufficient documentation

## 2015-08-21 DIAGNOSIS — Z9111 Patient's noncompliance with dietary regimen: Secondary | ICD-10-CM | POA: Diagnosis present

## 2015-08-21 DIAGNOSIS — Z87891 Personal history of nicotine dependence: Secondary | ICD-10-CM

## 2015-08-21 DIAGNOSIS — M1990 Unspecified osteoarthritis, unspecified site: Secondary | ICD-10-CM | POA: Diagnosis present

## 2015-08-21 DIAGNOSIS — Z96642 Presence of left artificial hip joint: Secondary | ICD-10-CM | POA: Diagnosis present

## 2015-08-21 DIAGNOSIS — Z7982 Long term (current) use of aspirin: Secondary | ICD-10-CM | POA: Diagnosis not present

## 2015-08-21 DIAGNOSIS — I5023 Acute on chronic systolic (congestive) heart failure: Secondary | ICD-10-CM | POA: Diagnosis present

## 2015-08-21 DIAGNOSIS — Z6841 Body Mass Index (BMI) 40.0 and over, adult: Secondary | ICD-10-CM

## 2015-08-21 DIAGNOSIS — Z888 Allergy status to other drugs, medicaments and biological substances status: Secondary | ICD-10-CM

## 2015-08-21 DIAGNOSIS — I129 Hypertensive chronic kidney disease with stage 1 through stage 4 chronic kidney disease, or unspecified chronic kidney disease: Secondary | ICD-10-CM | POA: Diagnosis present

## 2015-08-21 DIAGNOSIS — I509 Heart failure, unspecified: Secondary | ICD-10-CM | POA: Diagnosis not present

## 2015-08-21 DIAGNOSIS — Z79891 Long term (current) use of opiate analgesic: Secondary | ICD-10-CM | POA: Diagnosis not present

## 2015-08-21 DIAGNOSIS — I472 Ventricular tachycardia: Secondary | ICD-10-CM | POA: Diagnosis present

## 2015-08-21 DIAGNOSIS — E875 Hyperkalemia: Secondary | ICD-10-CM | POA: Diagnosis present

## 2015-08-21 DIAGNOSIS — J9600 Acute respiratory failure, unspecified whether with hypoxia or hypercapnia: Secondary | ICD-10-CM | POA: Diagnosis present

## 2015-08-21 DIAGNOSIS — Z87442 Personal history of urinary calculi: Secondary | ICD-10-CM | POA: Diagnosis not present

## 2015-08-21 DIAGNOSIS — I480 Paroxysmal atrial fibrillation: Secondary | ICD-10-CM | POA: Diagnosis present

## 2015-08-21 DIAGNOSIS — I4891 Unspecified atrial fibrillation: Secondary | ICD-10-CM | POA: Diagnosis present

## 2015-08-21 DIAGNOSIS — Z79899 Other long term (current) drug therapy: Secondary | ICD-10-CM | POA: Diagnosis not present

## 2015-08-21 DIAGNOSIS — I4729 Other ventricular tachycardia: Secondary | ICD-10-CM | POA: Insufficient documentation

## 2015-08-21 DIAGNOSIS — I428 Other cardiomyopathies: Secondary | ICD-10-CM | POA: Diagnosis present

## 2015-08-21 LAB — COMPREHENSIVE METABOLIC PANEL
ALBUMIN: 3 g/dL — AB (ref 3.5–5.0)
ALBUMIN: 3 g/dL — AB (ref 3.5–5.0)
ALT: 25 U/L (ref 17–63)
ALT: 15 U/L — AB (ref 17–63)
ANION GAP: 9 (ref 5–15)
AST: 54 U/L — ABNORMAL HIGH (ref 15–41)
AST: 24 U/L (ref 15–41)
Alkaline Phosphatase: 47 U/L (ref 38–126)
Alkaline Phosphatase: 44 U/L (ref 38–126)
Anion gap: 7 (ref 5–15)
BILIRUBIN TOTAL: 1.7 mg/dL — AB (ref 0.3–1.2)
BUN: 25 mg/dL — ABNORMAL HIGH (ref 6–20)
BUN: 25 mg/dL — AB (ref 6–20)
CHLORIDE: 105 mmol/L (ref 101–111)
CO2: 23 mmol/L (ref 22–32)
CO2: 29 mmol/L (ref 22–32)
CREATININE: 1.25 mg/dL — AB (ref 0.61–1.24)
Calcium: 8.3 mg/dL — ABNORMAL LOW (ref 8.9–10.3)
Calcium: 9 mg/dL (ref 8.9–10.3)
Chloride: 100 mmol/L — ABNORMAL LOW (ref 101–111)
Creatinine, Ser: 1.24 mg/dL (ref 0.61–1.24)
GFR calc Af Amer: >60 mL/min (ref >60)
GFR calc non Af Amer: 53 mL/min — ABNORMAL LOW (ref >60)
GFR, EST NON AFRICAN AMERICAN: 54 mL/min — AB (ref >60)
Glucose, Bld: 145 mg/dL — ABNORMAL HIGH (ref 65–99)
Glucose, Bld: 163 mg/dL — ABNORMAL HIGH (ref 65–99)
Potassium: 5.1 mmol/L (ref 3.5–5.1)
SODIUM: 141 mmol/L (ref 135–145)
Sodium: 132 mmol/L — ABNORMAL LOW (ref 135–145)
TOTAL PROTEIN: 6.6 g/dL (ref 6.5–8.1)
Total Bilirubin: 0.4 mg/dL (ref 0.3–1.2)
Total Protein: 6.5 g/dL (ref 6.5–8.1)

## 2015-08-21 LAB — I-STAT ARTERIAL BLOOD GAS, ED
Acid-Base Excess: 3 mmol/L — ABNORMAL HIGH (ref 0.0–2.0)
Bicarbonate: 29.1 mEq/L — ABNORMAL HIGH (ref 20.0–24.0)
O2 Saturation: 100 %
PCO2 ART: 51.4 mmHg — AB (ref 35.0–45.0)
PH ART: 7.361 (ref 7.350–7.450)
TCO2: 31 mmol/L (ref 0–100)
pO2, Arterial: 409 mmHg — ABNORMAL HIGH (ref 80.0–100.0)

## 2015-08-21 LAB — STREP PNEUMONIAE URINARY ANTIGEN: Strep Pneumo Urinary Antigen: NEGATIVE

## 2015-08-21 LAB — CBC WITH DIFFERENTIAL/PLATELET
Basophils Absolute: 0 10*3/uL (ref 0.0–0.1)
Basophils Relative: 0 %
EOS ABS: 0.4 10*3/uL (ref 0.0–0.7)
Eosinophils Relative: 5 %
HCT: 36 % — ABNORMAL LOW (ref 39.0–52.0)
HEMOGLOBIN: 12 g/dL — AB (ref 13.0–17.0)
LYMPHS ABS: 2.2 10*3/uL (ref 0.7–4.0)
Lymphocytes Relative: 28 %
MCH: 32 pg (ref 26.0–34.0)
MCHC: 33.3 g/dL (ref 30.0–36.0)
MCV: 96 fL (ref 78.0–100.0)
Monocytes Absolute: 0.8 10*3/uL (ref 0.1–1.0)
Monocytes Relative: 11 %
NEUTROS PCT: 56 %
Neutro Abs: 4.5 10*3/uL (ref 1.7–7.7)
Platelets: 245 10*3/uL (ref 150–400)
RBC: 3.75 MIL/uL — AB (ref 4.22–5.81)
RDW: 14.5 % (ref 11.5–15.5)
WBC: 7.8 10*3/uL (ref 4.0–10.5)

## 2015-08-21 LAB — GLUCOSE, CAPILLARY
GLUCOSE-CAPILLARY: 97 mg/dL (ref 65–99)
GLUCOSE-CAPILLARY: 98 mg/dL (ref 65–99)

## 2015-08-21 LAB — TROPONIN I
TROPONIN I: 0.06 ng/mL — AB (ref <0.031)
Troponin I: 0.05 ng/mL — ABNORMAL HIGH (ref <0.031)
Troponin I: 0.05 ng/mL — ABNORMAL HIGH (ref <0.031)

## 2015-08-21 LAB — BRAIN NATRIURETIC PEPTIDE: B NATRIURETIC PEPTIDE 5: 219.6 pg/mL — AB (ref 0.0–100.0)

## 2015-08-21 LAB — PROCALCITONIN

## 2015-08-21 LAB — I-STAT TROPONIN, ED: Troponin i, poc: 0.03 ng/mL (ref 0.00–0.08)

## 2015-08-21 MED ORDER — SODIUM CHLORIDE 0.9 % IJ SOLN: 3.0000 mL | INTRAMUSCULAR | Status: DC | PRN

## 2015-08-21 MED ORDER — PREDNISOLONE ACETATE 0.12 % OP SUSP: 1.0000 [drp] | Freq: Two times a day (BID) | OPHTHALMIC | Status: DC

## 2015-08-21 MED ORDER — FUROSEMIDE 10 MG/ML IJ SOLN
40.0000 mg | Freq: Once | INTRAMUSCULAR | Status: AC
  Administered 2015-08-21: 40 mg via INTRAVENOUS
  Filled 2015-08-21: qty 4

## 2015-08-21 MED ORDER — ENOXAPARIN SODIUM 40 MG/0.4ML ~~LOC~~ SOLN
40.0000 mg | SUBCUTANEOUS | Status: DC
  Administered 2015-08-21 – 2015-08-22 (×2): 40 mg via SUBCUTANEOUS
  Filled 2015-08-21 (×3): qty 0.4

## 2015-08-21 MED ORDER — FUROSEMIDE 10 MG/ML IJ SOLN: 40.0000 mg | Freq: Two times a day (BID) | INTRAMUSCULAR | Status: DC

## 2015-08-21 MED ORDER — HYDRALAZINE HCL 10 MG PO TABS
20.0000 mg | ORAL_TABLET | Freq: Two times a day (BID) | ORAL | Status: DC
  Administered 2015-08-21 – 2015-08-23 (×4): 20 mg via ORAL
  Filled 2015-08-21 (×4): qty 2

## 2015-08-21 MED ORDER — CARVEDILOL 12.5 MG PO TABS
12.5000 mg | ORAL_TABLET | Freq: Two times a day (BID) | ORAL | Status: DC
  Administered 2015-08-21 – 2015-08-23 (×4): 12.5 mg via ORAL
  Filled 2015-08-21 (×4): qty 1

## 2015-08-21 MED ORDER — NIACIN 500 MG PO TABS
500.0000 mg | ORAL_TABLET | Freq: Every evening | ORAL | Status: DC | PRN
  Filled 2015-08-21: qty 1

## 2015-08-21 MED ORDER — SODIUM CHLORIDE 0.9 % IV SOLN: 250.0000 mL | INTRAVENOUS | Status: DC | PRN

## 2015-08-21 MED ORDER — SODIUM CHLORIDE 0.9 % IJ SOLN
3.0000 mL | Freq: Two times a day (BID) | INTRAMUSCULAR | Status: DC
  Administered 2015-08-21 – 2015-08-23 (×4): 3 mL via INTRAVENOUS

## 2015-08-21 MED ORDER — DOCUSATE SODIUM 100 MG PO CAPS
100.0000 mg | ORAL_CAPSULE | Freq: Two times a day (BID) | ORAL | Status: DC
  Administered 2015-08-21 – 2015-08-23 (×3): 100 mg via ORAL
  Filled 2015-08-21 (×4): qty 1

## 2015-08-21 MED ORDER — TAMSULOSIN HCL 0.4 MG PO CAPS
0.4000 mg | ORAL_CAPSULE | Freq: Every day | ORAL | Status: DC
  Administered 2015-08-22: 0.4 mg via ORAL
  Filled 2015-08-21: qty 1

## 2015-08-21 MED ORDER — ALLOPURINOL 100 MG PO TABS
200.0000 mg | ORAL_TABLET | Freq: Every day | ORAL | Status: DC
  Administered 2015-08-22 – 2015-08-23 (×2): 200 mg via ORAL
  Filled 2015-08-21 (×3): qty 2

## 2015-08-21 MED ORDER — PIPERACILLIN-TAZOBACTAM 3.375 G IVPB 30 MIN
3.3750 g | Freq: Once | INTRAVENOUS | Status: AC
  Administered 2015-08-21: 3.375 g via INTRAVENOUS
  Filled 2015-08-21: qty 50

## 2015-08-21 MED ORDER — ACETAMINOPHEN 325 MG PO TABS
650.0000 mg | ORAL_TABLET | ORAL | Status: DC | PRN
  Administered 2015-08-22: 650 mg via ORAL
  Filled 2015-08-21: qty 2

## 2015-08-21 MED ORDER — SPIRONOLACTONE 25 MG PO TABS
12.5000 mg | ORAL_TABLET | Freq: Every day | ORAL | Status: DC
  Administered 2015-08-22 – 2015-08-23 (×2): 12.5 mg via ORAL
  Filled 2015-08-21 (×2): qty 1

## 2015-08-21 MED ORDER — ASPIRIN EC 81 MG PO TBEC
81.0000 mg | DELAYED_RELEASE_TABLET | Freq: Every day | ORAL | Status: DC
  Administered 2015-08-22 – 2015-08-23 (×2): 81 mg via ORAL
  Filled 2015-08-21 (×3): qty 1

## 2015-08-21 MED ORDER — SIMVASTATIN 40 MG PO TABS
40.0000 mg | ORAL_TABLET | Freq: Every day | ORAL | Status: DC
  Administered 2015-08-22 – 2015-08-23 (×2): 40 mg via ORAL
  Filled 2015-08-21 (×2): qty 1

## 2015-08-21 MED ORDER — PIPERACILLIN-TAZOBACTAM 3.375 G IVPB
3.3750 g | Freq: Three times a day (TID) | INTRAVENOUS | Status: DC
  Administered 2015-08-21 – 2015-08-23 (×6): 3.375 g via INTRAVENOUS
  Filled 2015-08-21 (×10): qty 50

## 2015-08-21 MED ORDER — VANCOMYCIN HCL IN DEXTROSE 750-5 MG/150ML-% IV SOLN
750.0000 mg | Freq: Two times a day (BID) | INTRAVENOUS | Status: DC
  Administered 2015-08-21: 750 mg via INTRAVENOUS
  Filled 2015-08-21 (×3): qty 150

## 2015-08-21 MED ORDER — POLYETHYLENE GLYCOL 3350 17 G PO PACK
17.0000 g | PACK | Freq: Every day | ORAL | Status: DC
  Filled 2015-08-21 (×2): qty 1

## 2015-08-21 MED ORDER — TRAMADOL HCL 50 MG PO TABS
50.0000 mg | ORAL_TABLET | Freq: Three times a day (TID) | ORAL | Status: DC | PRN
  Administered 2015-08-22: 50 mg via ORAL
  Filled 2015-08-21: qty 1

## 2015-08-21 MED ORDER — VANCOMYCIN HCL 10 G IV SOLR
2000.0000 mg | Freq: Once | INTRAVENOUS | Status: AC
  Administered 2015-08-21: 2000 mg via INTRAVENOUS
  Filled 2015-08-21: qty 2000

## 2015-08-21 MED ORDER — ONDANSETRON HCL 4 MG/2ML IJ SOLN: 4.0000 mg | Freq: Four times a day (QID) | INTRAMUSCULAR | Status: DC | PRN

## 2015-08-21 MED ORDER — ISOSORBIDE DINITRATE ER 40 MG PO TBCR
40.0000 mg | EXTENDED_RELEASE_TABLET | Freq: Three times a day (TID) | ORAL | Status: DC
  Administered 2015-08-21: 40 mg via ORAL
  Filled 2015-08-21 (×7): qty 1

## 2015-08-21 MED ORDER — FUROSEMIDE 10 MG/ML IJ SOLN
40.0000 mg | Freq: Two times a day (BID) | INTRAMUSCULAR | Status: DC
  Administered 2015-08-21 – 2015-08-23 (×4): 40 mg via INTRAVENOUS
  Filled 2015-08-21 (×4): qty 4

## 2015-08-21 MED ORDER — TAMSULOSIN HCL 0.4 MG PO CAPS
0.4000 mg | ORAL_CAPSULE | Freq: Every day | ORAL | Status: DC
Start: 2015-08-21 — End: 2015-08-21

## 2015-08-21 NOTE — ED Notes (Signed)
Per ems- pt comes from home, c/o sob since 0645 this morning. Pt met EMS at the door, was able to walk to the truck. Diminished lung sounds, rales heard with hx of chf. Pt was placed on CPAP, given 5 mg neb treatment. EKG unremarkable, question AFIB? Hx chf, copd.

## 2015-08-21 NOTE — H&P (Signed)
History and Physical  Ryan Macdonald ZOX:[id number] DOB: 1934/01/20 DOA: 08/21/2015  Referring physician: Dr Corlis Leak, ED physician PCP: [facility]   Chief Complaint: Shortness of breath  HPI: Ryan Macdonald is a 81 y.o. male  With a history of nonischemic cardiomyopathy with a left ventricular ejection fraction of 15% on echocardiogram done 07/23/15, hypertension, hyperlipidemia, paroxysmal atrial fibrillation postoperatively in 2014 - currently not on anticoagulation. The patient was hospitalized in June due to acute on chronic heart failure - he was briefly intubated, and has since recuperated. He had no difficulties since that time. He became acutely short of breath this 81 year old 6 AM, worsened with ambulation and improved with rest. EMS was called and patient was placed on C Pap while transported here to the hospital. He rapidly stabilized and was taken off and is currently on 2 L nasal cannula. His symptoms are improving slightly. He does report eating [place] food a few days ago. He has missed his evening blood pressure medications (hydralazine) a couple days in a row.  He denies fevers, chills, cough, sputum production.  The patient had been on Xarelto in the past, but had hematuria. To his knowledge, he has not had itchy fibrillation since being postoperative in 2014.   Review of Systems:   Pt complains of mild peripheral edema.  Pt denies any fevers, chills, nausea, vomiting, diarrhea, constipation, abdominal pain, orthopnea, cough, wheezing, palpitations, headache, vision changes, lightheadedness, dizziness, melena, rectal bleeding.  Review of systems are otherwise negative  Past Medical History  Diagnosis Date  . Nonischemic cardiomyopathy     a. ? 2009 Cath in [place] - nl cors per pt;  b. 09/2013 Echo: EF 25-30%, sev diff HK.  Marland Kitchen Hypertension   . Cat scratch fever     removed mass on right side neck  . Cataracts, bilateral   . High cholesterol   . Osteoarthritis    . Kidney stones   . Diabetes mellitus without complication   . Morbid obesity   . PAF (paroxysmal atrial fibrillation)     a. post-op hip in 2014 - prev on xarelto.  . CHF (congestive heart failure)    Past Surgical History  Procedure Laterality Date  . Kidney stone surgery Right 1970's; 1983    "twice"  . Tonsillectomy and adenoidectomy  1940's  . Cystoscopy with urethral dilatation  10/23/2013  . Total hip arthroplasty Left 10/23/2013  . Inguinal hernia repair Right 1950's  . Circumcision  1940's  . Incision and drainage of wound Right 1980's    "cat scratch" (10/23/2013)  . Cardiac catheterization  1980's  . Total hip arthroplasty Left 10/23/2013    Procedure: TOTAL HIP ARTHROPLASTY;  Surgeon: Valeria Batman, MD;  Location: [facility] OR;  Service: Orthopedics;  Laterality: Left;  . Cystoscopy with urethral dilatation N/A 10/23/2013    Procedure: CYSTOSCOPY WITH URETHRAL DILATATION;  Surgeon: Kathi Ludwig, MD;  Location: [facility] OR;  Service: Urology;  Laterality: N/A;   Social History:  reports that he has quit smoking. His smoking use included Cigarettes and Cigars. He has a 15 pack-year smoking history. He has never used smokeless tobacco. He reports that he does not drink alcohol or use illicit drugs. Patient lives at home & is able to participate in activities of daily living  Allergies  Allergen Reactions  . Lisinopril Swelling    Family History  Problem Relation Age of Onset  . Other      negative for premature CAD  Prior to Admission medications   Medication Sig Start Date End Date Taking? Authorizing Provider  allopurinol (ZYLOPRIM) 100 MG tablet Take 200 mg by mouth daily.   Yes Historical Provider, MD  aspirin EC 81 MG tablet Take 81 mg by mouth daily.   Yes Historical Provider, MD  carvedilol (COREG) 12.5 MG tablet Take 12.5 mg by mouth 2 (two) times daily with a meal.   Yes Historical Provider, MD  cyanocobalamin 1000 MCG tablet Take 2,000 mcg by mouth  daily.   Yes Historical Provider, MD  docusate sodium (COLACE) 100 MG capsule Take 100 mg by mouth 2 (two) times daily. For constipation   Yes Historical Provider, MD  furosemide (LASIX) 40 MG tablet Take 1 tablet (40 mg total) by mouth daily. 05/27/15  Yes Clydia Llano, MD  hydrALAZINE (APRESOLINE) 10 MG tablet Take 20 mg by mouth 2 (two) times daily.   Yes Historical Provider, MD  HYDROcodone-acetaminophen (NORCO/VICODIN) 5-325 MG per tablet Take 1 tablet by mouth every 8 (eight) hours as needed for moderate pain (do not exceed 4 gm of tylenol in 24 ours). 06/06/15  Yes Kirt Boys, DO  isosorbide dinitrate (ISOCHRON) 40 MG CR tablet Take 40 mg by mouth 3 (three) times daily.   Yes Historical Provider, MD  lactobacillus acidophilus (BACID) TABS tablet Take 1 tablet by mouth 2 (two) times daily.   Yes Historical Provider, MD  niacin 500 MG tablet Take 500 mg by mouth at bedtime as needed (Per dr orders).   Yes Historical Provider, MD  polyethylene glycol (MIRALAX / GLYCOLAX) packet Take 17 g by mouth daily. For constipation   Yes Historical Provider, MD  prednisoLONE acetate (PRED MILD) 0.12 % ophthalmic suspension Place 1 drop into the right eye 2 (two) times daily.   Yes Historical Provider, MD  simvastatin (ZOCOR) 40 MG tablet Take 40 mg by mouth daily. For high cholesterol   Yes Historical Provider, MD  spironolactone (ALDACTONE) 25 MG tablet Take 0.5 tablets (12.5 mg total) by mouth daily. 05/27/15  Yes Clydia Llano, MD  tamsulosin (FLOMAX) 0.4 MG CAPS capsule Take 0.4 mg by mouth at bedtime.   Yes Historical Provider, MD  traMADol (ULTRAM) 50 MG tablet Take 50 mg by mouth 3 (three) times daily as needed for moderate pain.    Yes Historical Provider, MD    Physical Exam: BP 124/75 mmHg  Pulse 75  Temp(Src) 98.2 F (36.8 C) (Oral)  Resp 18  SpO2 98%  General: Older black male. Awake and alert and oriented x3. No acute cardiopulmonary distress.  Eyes: Pupils equal, round, reactive to  light. Extraocular muscles are intact. Sclerae anicteric and noninjected.  ENT: Moist mucosal membranes. No mucosal lesions. Teeth in moderate repair  Neck: Neck supple without lymphadenopathy. No carotid bruits. No masses palpated.  Cardiovascular: Regular rate with normal S1-S2 sounds. No murmurs, rubs, gallops auscultated. JVD to 8 cm. 1+ pitting edema in ankles bilaterally. Respiratory: Diminished breath sounds. No wheezes. Rales in bases bilaterally to mid lung field. No rhonchi  Abdomen: Soft, nontender, nondistended. Active bowel sounds. No masses or hepatosplenomegaly  Skin: Dry, warm to touch. 2+ dorsalis pedis and radial pulses. Musculoskeletal: No calf or leg pain. All major joints not erythematous nontender.  Psychiatric: Intact judgment and insight.  Neurologic: No focal neurological deficits. Cranial nerves II through XII are grossly intact.           Labs on Admission:  Basic Metabolic Panel:  Recent Labs Lab 08/21/15 0722  NA 132*  K >7.5*  CL 100*  CO2 23  GLUCOSE 145*  BUN 25*  CREATININE 1.24  CALCIUM 8.3*   Liver Function Tests:  Recent Labs Lab 08/21/15 0722  AST 54*  ALT 25  ALKPHOS 47  BILITOT 1.7*  PROT 6.6  ALBUMIN 3.0*   No results for input(s): LIPASE, AMYLASE in the last 168 hours. No results for input(s): AMMONIA in the last 168 hours. CBC:  Recent Labs Lab 08/21/15 0722  WBC 7.8  NEUTROABS 4.5  HGB 12.0*  HCT 36.0*  MCV 96.0  PLT 245   Cardiac Enzymes: No results for input(s): CKTOTAL, CKMB, CKMBINDEX, TROPONINI in the last 168 hours.  BNP (last 3 results)  Recent Labs  01/22/15 1244 08/21/15 0722  BNP 206.4* 219.6*    ProBNP (last 3 results) No results for input(s): PROBNP in the last 8760 hours.  CBG: No results for input(s): GLUCAP in the last 168 hours.  Radiological Exams on Admission: Dg Chest Portable 1 View  08/21/2015   CLINICAL DATA:  Short of breath  EXAM: PORTABLE CHEST - 1 VIEW  COMPARISON:  Chest CT  05/25/2015  FINDINGS: Normal cardiac silhouette. There is increased opacity projecting over the LEFT lower hemi thorax concerning for airspace disease. No pleural fluid. No pneumothorax. Ectatic aorta.  IMPRESSION: Concern for LEFT lower lobe pneumonia versus pulmonary edema.   Electronically Signed   By: Genevive Bi M.D.   On: 08/21/2015 07:52    EKG: Independently reviewed. Sinus rhythm with a rate of 75. Normal PR and QTC intervals. PR interval slightly prolonged at 0.220.  No acute ST elevation or depression. No acute STEMI  Assessment/Plan Present on Admission:  . HCAP (healthcare-associated pneumonia) . Acute respiratory failure, unspecified whether with hypoxia or hypercapnia . Atrial fibrillation . Acute on chronic systolic heart failure . Hyperkalemia  This patient was discussed with the ED physician, including pertinent vitals, physical exam findings, labs, and imaging.  We also discussed care given by the ED provider.  #1 acute respiratory failure  Admit to telemetry  Continue nasal cannula #2 acute on chronic systolic heart failure  Decompensated  Possibly due to this blood pressure doses and increase salt intake  Continue Lasix 40 mg twice a day  Fluid restrict  Strict I's and O's  Check metabolic panel in the morning  Continue spironolactone  Although the patient had an echocardiogram 3 weeks ago, due to poor EF and acute decompensation will obtain Echocardiogram  No ACE inhibitor secondary to angioedema #3 possible healthcare associated pneumonia  Continue Vanco and Zosyn  Blood cultures obtained in the emergency department  Pro-calcitonin - different calcitonin is normal, will discontinue antibiotics as chest x-ray and symptomatology is more consistent with acute on chronic heart failure  Strep antigen and Legionella antigen by urine #4 question of atrial fibrillation  Patient's EKGs do not show atrial fibrillation  Continue  telemetry  Patient not currently on anticoagulation - had systematic bleeding on Xarelto  Chads 2 Vasc score - 6 #5 hyperkalemia   Sample was hemolyzed, although patient is on spironolactone which may be increasing his potassium  Recheck potassium  Hold potassium replacement #6 hypertension  Continue home medications  DVT prophylaxis: Lovenox  Consultants: None  Code Status: Full code  Family Communication: Wife in the room   Disposition Plan: Admit to telemetry   Levie Heritage, DO [facility] Pager [phone]

## 2015-08-21 NOTE — ED Notes (Addendum)
Respiratory at bedside taking patient off bipap. Patient placed on 4L [place].

## 2015-08-21 NOTE — Progress Notes (Signed)
ANTIBIOTIC CONSULT NOTE - INITIAL  Pharmacy Consult for vancomycin + zosyn Indication: rule out pneumonia  Allergies  Allergen Reactions  . Lisinopril Swelling    Patient Measurements:   Adjusted Body Weight:   Vital Signs: BP: 135/80 mmHg (09/22 0830) Pulse Rate: 78 (09/22 0830) Intake/Output from previous day:   Intake/Output from this shift: Total I/O In: -  Out: 200 [Urine:200]  Labs:  Recent Labs  08/21/15 0722  WBC 7.8  HGB 12.0*  PLT 245  CREATININE 1.24   CrCl cannot be calculated (Unknown ideal weight.). No results for input(s): VANCOTROUGH, VANCOPEAK, VANCORANDOM, GENTTROUGH, GENTPEAK, GENTRANDOM, TOBRATROUGH, TOBRAPEAK, TOBRARND, AMIKACINPEAK, AMIKACINTROU, AMIKACIN in the last 72 hours.   Microbiology: No results found for this or any previous visit (from the past 720 hour(s)).  Medical History: Past Medical History  Diagnosis Date  . Nonischemic cardiomyopathy     a. ? 2009 Cath in [place] - nl cors per pt;  b. 09/2013 Echo: EF 25-30%, sev diff HK.  Marland Kitchen Hypertension   . Cat scratch fever     removed mass on right side neck  . Cataracts, bilateral   . High cholesterol   . Osteoarthritis   . Kidney stones   . Diabetes mellitus without complication   . Morbid obesity   . PAF (paroxysmal atrial fibrillation)     a. post-op hip in 2014 - prev on xarelto.  . CHF (congestive heart failure)     Medications:  Anti-infectives    Start     Dose/Rate Route Frequency Ordered Stop   08/21/15 2200  vancomycin (VANCOCIN) IVPB 750 mg/150 ml premix     750 mg 150 mL/hr over 60 Minutes Intravenous Every 12 hours 08/21/15 0855     08/21/15 1500  piperacillin-tazobactam (ZOSYN) IVPB 3.375 g     3.375 g 12.5 mL/hr over 240 Minutes Intravenous Every 8 hours 08/21/15 0855     08/21/15 0845  piperacillin-tazobactam (ZOSYN) IVPB 3.375 g     3.375 g 100 mL/hr over 30 Minutes Intravenous  Once 08/21/15 0842     08/21/15 0845  vancomycin (VANCOCIN) 2,000 mg in sodium  chloride 0.9 % 500 mL IVPB     2,000 mg 250 mL/hr over 120 Minutes Intravenous  Once 08/21/15 0842       Assessment: 81 yom presented to the ED with SOB. To start empiric vancomycin + zosyn for possible pneumonia. Pt is afebrile and WBC is WNL. Scr is 1.24.   Vanc 9/22>> Zosyn 9/22>>  Goal of Therapy:  Vancomycin trough level 15-20 mcg/ml  Plan:  - Vancomycin 2gm IV x 1 then 750mg  IV Q12H - Zosyn 3.375gm IV Q8H (4 hr inf) - F/u renal fxn, C&S, clinical status and trough at [facility]  Rumbarger, Drake Leach 08/21/2015,8:56 AM

## 2015-08-21 NOTE — ED Provider Notes (Signed)
CSN: [id number]     Arrival date & time 08/21/15  4142 History   First MD Initiated Contact with Patient 08/21/15 [phone]     Chief Complaint  Patient presents with  . Shortness of Breath     (Consider location/radiation/quality/duration/timing/severity/associated sxs/prior Treatment) HPI   Patient is a 81 year old male presenting with shortness of breath. Patient developed shortness breath when he woke this morning. Patient was placed on CPAP by EMS. On arrival he is using accessory muscles but satting well and in no acute distress on CPAP.  Patient is extensive history of both COPD and CHF. He was admitted and intubated in June for fluid overload.  He denies chest pain. Denies any change in his medications. Denies any dietary indiscretions.  Past Medical History  Diagnosis Date  . Nonischemic cardiomyopathy     a. ? 2009 Cath in [place] - nl cors per pt;  b. 09/2013 Echo: EF 25-30%, sev diff HK.  Marland Kitchen Hypertension   . Cat scratch fever     removed mass on right side neck  . Cataracts, bilateral   . High cholesterol   . Osteoarthritis   . Kidney stones   . Diabetes mellitus without complication   . Morbid obesity   . PAF (paroxysmal atrial fibrillation)     a. post-op hip in 2014 - prev on xarelto.  . CHF (congestive heart failure)    Past Surgical History  Procedure Laterality Date  . Kidney stone surgery Right 1970's; 1983    "twice"  . Tonsillectomy and adenoidectomy  1940's  . Cystoscopy with urethral dilatation  10/23/2013  . Total hip arthroplasty Left 10/23/2013  . Inguinal hernia repair Right 1950's  . Circumcision  1940's  . Incision and drainage of wound Right 1980's    "cat scratch" (10/23/2013)  . Cardiac catheterization  1980's  . Total hip arthroplasty Left 10/23/2013    Procedure: TOTAL HIP ARTHROPLASTY;  Surgeon: Valeria Batman, MD;  Location: [facility] OR;  Service: Orthopedics;  Laterality: Left;  . Cystoscopy with urethral dilatation N/A 10/23/2013    Procedure:  CYSTOSCOPY WITH URETHRAL DILATATION;  Surgeon: Kathi Ludwig, MD;  Location: [facility] OR;  Service: Urology;  Laterality: N/A;   Family History  Problem Relation Age of Onset  . Other      negative for premature CAD   Social History  Substance Use Topics  . Smoking status: Former Smoker -- 1.00 packs/day for 15 years    Types: Cigarettes, Cigars  . Smokeless tobacco: Never Used     Comment: quit smoking ~ 40 yr ago  . Alcohol Use: No     Comment: rare beer.    Review of Systems  Constitutional: Negative for fever and activity change.  HENT: Negative for drooling and hearing loss.   Eyes: Negative for discharge and redness.  Respiratory: Positive for cough and shortness of breath.   Cardiovascular: Negative for chest pain.  Gastrointestinal: Negative for abdominal pain.  Genitourinary: Negative for dysuria and urgency.  Musculoskeletal: Negative for arthralgias.  Allergic/Immunologic: Negative for immunocompromised state.  Neurological: Negative for seizures and speech difficulty.  Psychiatric/Behavioral: Negative for behavioral problems and agitation.  All other systems reviewed and are negative.     Allergies  Lisinopril  Home Medications   Prior to Admission medications   Medication Sig Start Date End Date Taking? Authorizing Walida Cajas  Acetaminophen (CHLORASEPTIC SORE THROAT) 167 MG/5ML LIQD 1 spray every 8 hours as needed for sore throat    Historical Harvel Meskill, MD  aspirin 81 MG tablet Take 81 mg by mouth daily.    Historical Deaysia Grigoryan, MD  carvedilol (COREG) 25 MG tablet Take 25 mg by mouth 2 (two) times daily with a meal.    Historical Tiyah Zelenak, MD  cyanocobalamin 1000 MCG tablet Take 2,000 mcg by mouth daily.    Historical Kyron Schlitt, MD  docusate sodium (COLACE) 100 MG capsule Take 100 mg by mouth 2 (two) times daily. For constipation    Historical Frankey Botting, MD  furosemide (LASIX) 40 MG tablet Take 1 tablet (40 mg total) by mouth daily. 05/27/15   Clydia Llano, MD   hydrALAZINE (APRESOLINE) 10 MG tablet Take 20 mg by mouth 2 (two) times daily.    Historical Timiyah Romito, MD  HYDROcodone-acetaminophen (NORCO/VICODIN) 5-325 MG per tablet Take 1 tablet by mouth every 8 (eight) hours as needed for moderate pain (do not exceed 4 gm of tylenol in 24 ours). 06/06/15   Kirt Boys, DO  isosorbide dinitrate (ISOCHRON) 40 MG CR tablet Take 40 mg by mouth 3 (three) times daily.    Historical Ebelyn Bohnet, MD  niacin 500 MG tablet Take 500 mg by mouth at bedtime as needed (Per dr orders).    Historical Kalib Bhagat, MD  polyethylene glycol (MIRALAX / GLYCOLAX) packet Take 17 g by mouth daily. For constipation    Historical Hosey Burmester, MD  prednisoLONE acetate (PRED MILD) 0.12 % ophthalmic suspension Place 1 drop into the right eye 2 (two) times daily.    Historical Carmino Ocain, MD  simvastatin (ZOCOR) 40 MG tablet Take 40 mg by mouth daily. For high cholesterol    Historical Omesha Bowerman, MD  spironolactone (ALDACTONE) 25 MG tablet Take 0.5 tablets (12.5 mg total) by mouth daily. 05/27/15   Clydia Llano, MD  tamsulosin (FLOMAX) 0.4 MG CAPS capsule Take 0.4 mg by mouth at bedtime.    Historical Rosi Secrist, MD  traMADol (ULTRAM) 50 MG tablet Take 50 mg by mouth every 6 (six) hours as needed for moderate pain.     Historical Laquan Beier, MD   BP 104/56 mmHg  Pulse 74  Resp 22  SpO2 100% Physical Exam  Constitutional: He is oriented to person, place, and time. He appears well-nourished.  Obese 81 year old male  HENT:  Head: Normocephalic.  Mouth/Throat: Oropharynx is clear and moist.  Eyes: Conjunctivae are normal.  Neck: No tracheal deviation present.  Cardiovascular: Normal rate.   Pulmonary/Chest: No stridor. No respiratory distress. He has no wheezes. He has rales.  Using accessory muscles to breathe.  Abdominal: Soft. There is no tenderness. There is no guarding.  Musculoskeletal: Normal range of motion. He exhibits no edema.  Neurological: He is oriented to person, place, and time. No  cranial nerve deficit.  Skin: Skin is warm and dry. No rash noted. He is not diaphoretic.  Psychiatric: He has a normal mood and affect. His behavior is normal.  Nursing note and vitals reviewed.   ED Course  Procedures (including critical care time) Labs Review Labs Reviewed  CBC WITH DIFFERENTIAL/PLATELET  COMPREHENSIVE METABOLIC PANEL  BRAIN NATRIURETIC PEPTIDE  I-STAT TROPOININ, ED    Imaging Review No results found. I have personally reviewed and evaluated these images and lab results as part of my medical decision-making.   EKG Interpretation   Date/Time:  Thursday August 21 2015 07:16:32 EDT Ventricular Rate:  75 PR Interval:  220 QRS Duration: 96 QT Interval:  350 QTC Calculation: 391 R Axis:   54 Text Interpretation:  Sinus rhythm Prolonged PR interval Low voltage,  precordial leads Borderline repolarization  abnormality no acute ischemia  No significant change since last tracing Confirmed by Corlis Leak, COURTNEY  [phone]) on 08/21/2015 8:03:28 AM      MDM   Final diagnoses:  None    Patient is a 81 year old male presented with shortness of breath. Patient has a sensitive history of CHF and COPD. Patient has rales on exam. We'll get chest x-ray, continue BiPAP. Get blood gas. Patient may require admission for diuresis.  12:39 PM Pna vs fluid overload, will treat both and trial off bipap as tolerated.   CRITICAL CARE Performed by: Arlana Hove   Total critical care time: 15  Critical care time was exclusive of separately billable procedures and treating other patients.  Critical care was necessary to treat or prevent imminent or life-threatening deterioration.  Critical care was time spent personally by me on the following activities: development of treatment plan with patient and/or surrogate as well as nursing, discussions with consultants, evaluation of patient's response to treatment, examination of patient, obtaining history from patient or  surrogate, ordering and performing treatments and interventions, ordering and review of laboratory studies, ordering and review of radiographic studies, pulse oximetry and re-evaluation of patient's condition.   Courteney Randall An, MD 08/21/15 1239

## 2015-08-21 NOTE — ED Notes (Signed)
Spoke with lab, CMP was hemolyzed but reading high potassium. MD made aware, will order recollect.

## 2015-08-22 ENCOUNTER — Inpatient Hospital Stay (HOSPITAL_COMMUNITY): Payer: Medicare Other

## 2015-08-22 DIAGNOSIS — I509 Heart failure, unspecified: Secondary | ICD-10-CM

## 2015-08-22 DIAGNOSIS — I480 Paroxysmal atrial fibrillation: Secondary | ICD-10-CM

## 2015-08-22 DIAGNOSIS — I5023 Acute on chronic systolic (congestive) heart failure: Principal | ICD-10-CM

## 2015-08-22 DIAGNOSIS — J9600 Acute respiratory failure, unspecified whether with hypoxia or hypercapnia: Secondary | ICD-10-CM

## 2015-08-22 DIAGNOSIS — J189 Pneumonia, unspecified organism: Secondary | ICD-10-CM

## 2015-08-22 DIAGNOSIS — E875 Hyperkalemia: Secondary | ICD-10-CM

## 2015-08-22 LAB — BASIC METABOLIC PANEL
Anion gap: 9 (ref 5–15)
BUN: 23 mg/dL — AB (ref 6–20)
CALCIUM: 9.2 mg/dL (ref 8.9–10.3)
CO2: 31 mmol/L (ref 22–32)
CREATININE: 1.4 mg/dL — AB (ref 0.61–1.24)
Chloride: 100 mmol/L — ABNORMAL LOW (ref 101–111)
GFR calc Af Amer: 54 mL/min — ABNORMAL LOW (ref >60)
GFR, EST NON AFRICAN AMERICAN: 46 mL/min — AB (ref >60)
GLUCOSE: 111 mg/dL — AB (ref 65–99)
Potassium: 3.8 mmol/L (ref 3.5–5.1)
SODIUM: 140 mmol/L (ref 135–145)

## 2015-08-22 LAB — GLUCOSE, CAPILLARY
GLUCOSE-CAPILLARY: 168 mg/dL — AB (ref 65–99)
GLUCOSE-CAPILLARY: 138 mg/dL — AB (ref 65–99)
Glucose-Capillary: 138 mg/dL — ABNORMAL HIGH (ref 65–99)
Glucose-Capillary: 106 mg/dL — ABNORMAL HIGH (ref 65–99)

## 2015-08-22 LAB — MAGNESIUM: Magnesium: 1.4 mg/dL — ABNORMAL LOW (ref 1.7–2.4)

## 2015-08-22 MED ORDER — ISOSORBIDE DINITRATE ER 40 MG PO CPCR
40.0000 mg | ORAL_CAPSULE | Freq: Two times a day (BID) | ORAL | Status: DC
  Administered 2015-08-22 – 2015-08-23 (×3): 40 mg via ORAL
  Filled 2015-08-22 (×4): qty 1

## 2015-08-22 MED ORDER — SODIUM CHLORIDE 0.9 % IV SOLN
1500.0000 mg | INTRAVENOUS | Status: DC
  Administered 2015-08-22: 1500 mg via INTRAVENOUS
  Filled 2015-08-22 (×2): qty 1500

## 2015-08-22 MED ORDER — ISOSORBIDE DINITRATE ER 40 MG PO TBCR
40.0000 mg | EXTENDED_RELEASE_TABLET | Freq: Two times a day (BID) | ORAL | Status: DC
  Filled 2015-08-22 (×2): qty 1

## 2015-08-22 MED ORDER — PERFLUTREN LIPID MICROSPHERE
1.0000 mL | INTRAVENOUS | Status: AC | PRN
  Administered 2015-08-22: 2 mL via INTRAVENOUS
  Filled 2015-08-22: qty 10

## 2015-08-22 NOTE — Progress Notes (Signed)
[facility]                                                                              Patient Demographics  Ryan Macdonald, is a 81 y.o. male, DOB - 1934-08-03, ZOX:[id number]  Admit date - 08/21/2015   Admitting Physician Levie Heritage, DO  Outpatient Primary MD for the patient is [facility]  LOS - 1   Chief Complaint  Patient presents with  . Shortness of Breath      HPI on 08/21/2015 by Dr. Candelaria Celeste Ryan Macdonald is a 82 y.o. male  With a history of nonischemic cardiomyopathy with a left ventricular ejection fraction of 15% on echocardiogram done 07/23/15, hypertension, hyperlipidemia, paroxysmal atrial fibrillation postoperatively in 2014 - currently not on anticoagulation. The patient was hospitalized in June due to acute on chronic heart failure - he was briefly intubated, and has since recuperated. He had no difficulties since that time. He became acutely short of breath this 81 year old 6 AM, worsened with ambulation and improved with rest. EMS was called and patient was placed on C Pap while transported here to the hospital. He rapidly stabilized and was taken off and is currently on 2 L nasal cannula. His symptoms are improving slightly. He does report eating [place] food a few days ago. He has missed his evening blood pressure medications (hydralazine) a couple days in a row. He denies fevers, chills, cough, sputum production. The patient had been on Xarelto in the past, but had hematuria. To his knowledge, he has not had itchy fibrillation since being postoperative in 2014.  Assessment & Plan   Acute respiratory failure -Secondary to CHF exacerbation versus pneumonia -Improving, will attempt to wean off of supplemental oxygen  Acute systolic heart failure exacerbation -Likely secondary to noncompliance with medications as well as diet -Appears to be improving -Continue fluid restriction, daily weights, monitor strict intake and  output -Continue IV Lasix, spironolactone -Echocardiogram 07/23/2015 shows an EF of 15% -Patient currently not on ACE inhibitor or arm secondary to angioedema -Continue aspirin, Coreg, spironolactone, statin  ?HCAP -Chest x-ray show concern for left lower lobe pneumonia versus pulmonary edema -Will continue to diuresis and obtain repeat chest x-ray -Continue vancomycin and Zosyn -Strep pneumonia urine antigen negative, Legionella urine antigen pending -Blood cultures pending  ? Atrial fibrillation  -Patient currently appears to be in sinus rhythm, telemetry strips show sinus rhythm however EKG questionable atrial fibrillation  -Patient was on Xarelto in the past however had systemic bleeding  -CHADSVASC 6  Hyperkalemia  -Upon admission potassium 7.5, this has resolved currently 3.8  -Continue to monitor BMP  -Suspect sample was hemolyzed  Hypertension -Continue Coreg, Lasix, hydralazine, isosorbide dinitrate, spironolactone  Noncompliance -Had long discussion with patient regarding compliance of medications as well as diet and fluid.  Code Status: Full  Family Communication: None at bedside  Disposition Plan: Admitted, continue diuresis.  Expect discharge within 24-48 hours  Time Spent in minutes   30 minutes  Procedures  None  Consults   None  DVT Prophylaxis  Lovenox  Lab Results  Component Value Date   PLT 245 08/21/2015    Medications  Scheduled Meds: . allopurinol  200 mg Oral Daily  . aspirin EC  81 mg Oral Daily  . carvedilol  12.5 mg Oral BID WC  . docusate sodium  100 mg Oral BID  . enoxaparin (LOVENOX) injection  40 mg Subcutaneous Q24H  . furosemide  40 mg Intravenous BID  . hydrALAZINE  20 mg Oral BID  . isosorbide dinitrate  40 mg Oral Q12H  . piperacillin-tazobactam (ZOSYN)  IV  3.375 g Intravenous Q8H  . polyethylene glycol  17 g Oral Daily  . simvastatin  40 mg Oral Daily  . sodium chloride  3 mL Intravenous Q12H  . spironolactone  12.5  mg Oral Daily  . tamsulosin  0.4 mg Oral QHS  . vancomycin  1,500 mg Intravenous Q24H   Continuous Infusions:  PRN Meds:.sodium chloride, acetaminophen, niacin, ondansetron (ZOFRAN) IV, sodium chloride, traMADol  Antibiotics    Anti-infectives    Start     Dose/Rate Route Frequency Ordered Stop   08/22/15 1400  vancomycin (VANCOCIN) 1,500 mg in sodium chloride 0.9 % 500 mL IVPB     1,500 mg 250 mL/hr over 120 Minutes Intravenous Every 24 hours 08/22/15 0839     08/21/15 2200  vancomycin (VANCOCIN) IVPB 750 mg/150 ml premix  Status:  Discontinued     750 mg 150 mL/hr over 60 Minutes Intravenous Every 12 hours 08/21/15 0855 08/22/15 0838   08/21/15 1500  piperacillin-tazobactam (ZOSYN) IVPB 3.375 g     3.375 g 12.5 mL/hr over 240 Minutes Intravenous Every 8 hours 08/21/15 0855     08/21/15 0845  piperacillin-tazobactam (ZOSYN) IVPB 3.375 g     3.375 g 100 mL/hr over 30 Minutes Intravenous  Once 08/21/15 0842 08/21/15 0926   08/21/15 0845  vancomycin (VANCOCIN) 2,000 mg in sodium chloride 0.9 % 500 mL IVPB     2,000 mg 250 mL/hr over 120 Minutes Intravenous  Once 08/21/15 1610 08/21/15 1158      Subjective:   Ryan Macdonald seen and examined today.  Patient states he is feeling better as compared to yesterday. He does admit to being noncompliant with his medications as well as diet. Patient denies any chest pain, abdominal pain, cough, dizziness, headache at this time.  Objective:   Filed Vitals:   08/21/15 1342 08/21/15 1948 08/22/15 0436 08/22/15 0925  BP: 144/80 93/48 92/40  112/52  Pulse: 80 88 73 78  Temp: 98.3 F (36.8 C) 98.8 F (37.1 C) 98 F (36.7 C) 97.6 F (36.4 C)  TempSrc: Oral Oral Oral Oral  Resp: Height:  (1.702 m)     Weight: 122.879 kg (270 lb 14.4 oz)  121.745 kg (268 lb 6.4 oz)   SpO2: 100% 98% 98% 95%    Wt Readings from Last 3 Encounters:  08/22/15 121.745 kg (268 lb 6.4 oz)  06/09/15 116.121 kg (256 lb)  05/27/15 116.5 kg (256  lb 13.4 oz)     Intake/Output Summary (Last 24 hours) at 08/22/15 1046 Last data filed at 08/22/15 0925  Gross per 24 hour  Intake    460 ml  Output   4100 ml  Net  -3640 ml    Exam  General: Well developed, well nourished, NAD, appears stated age  HEENT: NCAT, mucous membranes moist.   Cardiovascular: S1 S2 auscultated, no rubs, murmurs or gallops. Regular rate and rhythm.  Respiratory: Clear to auscultation   Abdomen: Soft, obese, nontender, nondistended, + bowel sounds  Extremities: warm dry without cyanosis clubbing. Trace edema LE B/L  Neuro: AAOx3, nonfocal  Psych: Normal affect and demeanor with intact judgement and insight  Data Review   Micro Results No results found for this or any previous visit (from the past 240 hour(s)).  Radiology Reports Dg Chest Portable 1 View  08/21/2015   CLINICAL DATA:  Short of breath  EXAM: PORTABLE CHEST - 1 VIEW  COMPARISON:  Chest CT 05/25/2015  FINDINGS: Normal cardiac silhouette. There is increased opacity projecting over the LEFT lower hemi thorax concerning for airspace disease. No pleural fluid. No pneumothorax. Ectatic aorta.  IMPRESSION: Concern for LEFT lower lobe pneumonia versus pulmonary edema.   Electronically Signed   By: Genevive Bi M.D.   On: 08/21/2015 07:52    CBC  Recent Labs Lab 08/21/15 0722  WBC 7.8  HGB 12.0*  HCT 36.0*  PLT 245  MCV 96.0  MCH 32.0  MCHC 33.3  RDW 14.5  LYMPHSABS 2.2  MONOABS 0.8  EOSABS 0.4  BASOSABS 0.0    Chemistries   Recent Labs Lab 08/21/15 0722 08/21/15 0909 08/22/15 0335  NA 132* 141 140  K >7.5* 5.1 3.8  CL 100* 105 100*  CO2 23 29 31   GLUCOSE 145* 163* 111*  BUN 25* 25* 23*  CREATININE 1.24 1.25* 1.40*  CALCIUM 8.3* 9.0 9.2  AST 54* 24  --   ALT 25 15*  --   ALKPHOS 47 44  --   BILITOT 1.7* 0.4  --    ------------------------------------------------------------------------------------------------------------------ estimated creatinine  clearance is 53.4 mL/min (by C-G formula based on Cr of 1.4). ------------------------------------------------------------------------------------------------------------------ No results for input(s): HGBA1C in the last 72 hours. ------------------------------------------------------------------------------------------------------------------ No results for input(s): CHOL, HDL, LDLCALC, TRIG, CHOLHDL, LDLDIRECT in the last 72 hours. ------------------------------------------------------------------------------------------------------------------ No results for input(s): TSH, T4TOTAL, T3FREE, THYROIDAB in the last 72 hours.  Invalid input(s): FREET3 ------------------------------------------------------------------------------------------------------------------ No results for input(s): VITAMINB12, FOLATE, FERRITIN, TIBC, IRON, RETICCTPCT in the last 72 hours.  Coagulation profile No results for input(s): INR, PROTIME in the last 168 hours.  No results for input(s): DDIMER in the last 72 hours.  Cardiac Enzymes  Recent Labs Lab 08/21/15 1100 08/21/15 1628 08/21/15 2137  TROPONINI 0.06* 0.05* 0.05*   ------------------------------------------------------------------------------------------------------------------ Invalid input(s): POCBNP    Ryan Macdonald D.O. on 08/22/2015 at 10:46 AM  Between 7am to 7pm - Pager - [phone]  After 7pm go to [email or web address] - password [email or web address]  And look for the night coverage person covering for me after hours  [facility] Office  [phone]

## 2015-08-22 NOTE — Progress Notes (Signed)
ANTIBIOTIC CONSULT NOTE - INITIAL  Pharmacy Consult for vancomycin + zosyn Indication: rule out pneumonia  Allergies  Allergen Reactions  . Lisinopril Swelling    Patient Measurements: Height: 5\' 7"  (170.2 cm) Weight: 268 lb 6.4 oz (121.745 kg) (scale a) IBW/kg (Calculated) : 66.1 Adjusted Body Weight:   Vital Signs: Temp: 98 F (36.7 C) (09/23 0436) Temp Source: Oral (09/23 0436) BP: 92/40 mmHg (09/23 0436) Pulse Rate: 73 (09/23 0436) Intake/Output from previous day: 09/22 0701 - 09/23 0700 In: -  Out: 4425 [Urine:4425] Intake/Output from this shift: Total I/O In: -  Out: 150 [Urine:150]  Labs:  Recent Labs  08/21/15 0722 08/21/15 0909 08/22/15 0335  WBC 7.8  --   --   HGB 12.0*  --   --   PLT 245  --   --   CREATININE 1.24 1.25* 1.40*   Estimated Creatinine Clearance: 53.4 mL/min (by C-G formula based on Cr of 1.4). No results for input(s): VANCOTROUGH, VANCOPEAK, VANCORANDOM, GENTTROUGH, GENTPEAK, GENTRANDOM, TOBRATROUGH, TOBRAPEAK, TOBRARND, AMIKACINPEAK, AMIKACINTROU, AMIKACIN in the last 72 hours.   Microbiology: No results found for this or any previous visit (from the past 720 hour(s)).  Medical History: Past Medical History  Diagnosis Date  . Nonischemic cardiomyopathy     a. ? 2009 Cath in [place] - nl cors per pt;  b. 09/2013 Echo: EF 25-30%, sev diff HK.  Marland Kitchen Hypertension   . Cat scratch fever     removed mass on right side neck  . Cataracts, bilateral   . High cholesterol   . Osteoarthritis   . Kidney stones   . Morbid obesity   . PAF (paroxysmal atrial fibrillation)     a. post-op hip in 2014 - prev on xarelto.  . CHF (congestive heart failure)   . Shortness of breath dyspnea   . Diabetes mellitus without complication     TYPE 2    Medications:  Anti-infectives    Start     Dose/Rate Route Frequency Ordered Stop   08/22/15 1400  vancomycin (VANCOCIN) 1,500 mg in sodium chloride 0.9 % 500 mL IVPB     1,500 mg 250 mL/hr over 120 Minutes  Intravenous Every 24 hours 08/22/15 0839     08/21/15 2200  vancomycin (VANCOCIN) IVPB 750 mg/150 ml premix  Status:  Discontinued     750 mg 150 mL/hr over 60 Minutes Intravenous Every 12 hours 08/21/15 0855 08/22/15 0838   08/21/15 1500  piperacillin-tazobactam (ZOSYN) IVPB 3.375 g     3.375 g 12.5 mL/hr over 240 Minutes Intravenous Every 8 hours 08/21/15 0855     08/21/15 0845  piperacillin-tazobactam (ZOSYN) IVPB 3.375 g     3.375 g 100 mL/hr over 30 Minutes Intravenous  Once 08/21/15 0842 08/21/15 0926   08/21/15 0845  vancomycin (VANCOCIN) 2,000 mg in sodium chloride 0.9 % 500 mL IVPB     2,000 mg 250 mL/hr over 120 Minutes Intravenous  Once 08/21/15 0842 08/21/15 1158     Assessment: 81 yom presented to the ED with SOB. To start empiric vancomycin + zosyn for possible pneumonia. Scr scr has trended up a little bit. Going to adjust dose slightly.   Vanc 9/22>> Zosyn 9/22>>  Goal of Therapy:  Vancomycin trough level 15-20 mcg/ml  Plan:   - Change vanc to 1.5g IV q24 - Zosyn 3.375gm IV Q8H (4 hr inf) - Level if needed  Ulyses Southward, PharmD Pager: [phone] 08/22/2015 8:40 AM

## 2015-08-22 NOTE — Progress Notes (Signed)
  Echocardiogram 2D Echocardiogram has been performed.  Cathie Beams 08/22/2015, 12:04 PM

## 2015-08-22 NOTE — Progress Notes (Signed)
Cm

## 2015-08-22 NOTE — Progress Notes (Signed)
Dr. Catha Gosselin notified of pt 11 bt vtach run at 1723. Pt stable and laying in bed, no complaints. Will continue to monitor.

## 2015-08-23 ENCOUNTER — Inpatient Hospital Stay (HOSPITAL_COMMUNITY): Payer: Medicare Other

## 2015-08-23 DIAGNOSIS — J189 Pneumonia, unspecified organism: Secondary | ICD-10-CM | POA: Insufficient documentation

## 2015-08-23 DIAGNOSIS — I4729 Other ventricular tachycardia: Secondary | ICD-10-CM | POA: Insufficient documentation

## 2015-08-23 DIAGNOSIS — N183 Chronic kidney disease, stage 3 unspecified: Secondary | ICD-10-CM | POA: Insufficient documentation

## 2015-08-23 DIAGNOSIS — I472 Ventricular tachycardia: Secondary | ICD-10-CM

## 2015-08-23 LAB — CBC
HCT: 34.6 % — ABNORMAL LOW (ref 39.0–52.0)
HEMOGLOBIN: 11.4 g/dL — AB (ref 13.0–17.0)
MCH: 31.6 pg (ref 26.0–34.0)
MCHC: 32.9 g/dL (ref 30.0–36.0)
MCV: 95.8 fL (ref 78.0–100.0)
Platelets: 218 10*3/uL (ref 150–400)
RBC: 3.61 MIL/uL — AB (ref 4.22–5.81)
RDW: 14.1 % (ref 11.5–15.5)
WBC: 8.1 10*3/uL (ref 4.0–10.5)

## 2015-08-23 LAB — BASIC METABOLIC PANEL
ANION GAP: 11 (ref 5–15)
BUN: 27 mg/dL — AB (ref 6–20)
CO2: 31 mmol/L (ref 22–32)
Calcium: 9.1 mg/dL (ref 8.9–10.3)
Chloride: 96 mmol/L — ABNORMAL LOW (ref 101–111)
Creatinine, Ser: 1.61 mg/dL — ABNORMAL HIGH (ref 0.61–1.24)
GFR, EST AFRICAN AMERICAN: 45 mL/min — AB (ref >60)
GFR, EST NON AFRICAN AMERICAN: 39 mL/min — AB (ref >60)
Glucose, Bld: 131 mg/dL — ABNORMAL HIGH (ref 65–99)
POTASSIUM: 3.8 mmol/L (ref 3.5–5.1)
SODIUM: 138 mmol/L (ref 135–145)

## 2015-08-23 LAB — GLUCOSE, CAPILLARY
Glucose-Capillary: 122 mg/dL — ABNORMAL HIGH (ref 65–99)
Glucose-Capillary: 184 mg/dL — ABNORMAL HIGH (ref 65–99)

## 2015-08-23 LAB — MAGNESIUM: MAGNESIUM: 1.7 mg/dL (ref 1.7–2.4)

## 2015-08-23 LAB — PROCALCITONIN

## 2015-08-23 MED ORDER — ISOSORBIDE DINITRATE ER 40 MG PO CPCR: 40.0000 mg | ORAL_CAPSULE | Freq: Two times a day (BID) | ORAL | Status: DC

## 2015-08-23 MED ORDER — MAGNESIUM SULFATE 2 GM/50ML IV SOLN
2.0000 g | Freq: Once | INTRAVENOUS | Status: AC
  Administered 2015-08-23: 2 g via INTRAVENOUS
  Filled 2015-08-23: qty 50

## 2015-08-23 MED ORDER — POTASSIUM CHLORIDE CRYS ER 20 MEQ PO TBCR
40.0000 meq | EXTENDED_RELEASE_TABLET | Freq: Once | ORAL | Status: AC
  Administered 2015-08-23: 40 meq via ORAL
  Filled 2015-08-23: qty 2

## 2015-08-23 NOTE — Discharge Instructions (Signed)

## 2015-08-23 NOTE — Discharge Summary (Addendum)
Physician Discharge Summary  Ryan Macdonald HKV:[id number] DOB: [birth year] DOA: 08/21/2015  PCP: [facility]  Admit date: 08/21/2015 Discharge date: 08/23/2015  Time spent: 45 minutes  Recommendations for Outpatient Follow-up:  Patient will be discharged to home.  Patient will need to follow up with primary care provider within one week of discharge.  Follow up with cardiology one week, repeat BMP.  Patient should continue medications as prescribed.  Patient should follow a heart healthy diet with fluid restriction per day.  Resume activity as tolerated.  Discharge Diagnoses:  Acute risk for a failure Acute systolic heart failure exacerbation Questionable pneumonia Question mitral for ablation NSVT Chronic kidney disease, stage III Hyperkalemia Hypertension Noncompliance  Discharge Condition: Stable  Diet recommendation: heart healthy diet with fluid restriction per day  Filed Weights   08/21/15 1342 08/22/15 0436 08/23/15 0400  Weight: 122.879 kg (270 lb 14.4 oz) 121.745 kg (268 lb 6.4 oz) 121.065 kg (266 lb 14.4 oz)    History of present illness:  on 08/21/2015 by Dr. Candelaria Celeste Brilyn Macdonald is a 81 y.o. male  With a history of nonischemic cardiomyopathy with a left ventricular ejection fraction of 15% on echocardiogram done 07/23/15, hypertension, hyperlipidemia, paroxysmal atrial fibrillation postoperatively in 2014 - currently not on anticoagulation. The patient was hospitalized in June due to acute on chronic heart failure - he was briefly intubated, and has since recuperated. He had no difficulties since that time. He became acutely short of breath this 81 year old 6 AM, worsened with ambulation and improved with rest. EMS was called and patient was placed on C Pap while transported here to the hospital. He rapidly stabilized and was taken off and is currently on 2 L nasal cannula. His symptoms are improving slightly. He does report eating  [place] food a few days ago. He has missed his evening blood pressure medications (hydralazine) a couple days in a row. He denies fevers, chills, cough, sputum production. The patient had been on Xarelto in the past, but had hematuria. To his knowledge, he has not had itchy fibrillation since being postoperative in 2014.  Hospital Course:  Acute respiratory failure -Secondary to CHF exacerbation versus pneumonia -Resolved, weaned off of supplemental oxygen  Acute systolic heart failure exacerbation -Likely secondary to noncompliance with medications as well as diet -Continue fluid restriction, daily weights, monitor strict intake and output -Was placed on IV lasix -Echocardiogram 07/23/2015 shows an EF of 15% -Repeat echo 9/23:  EF 15-20% -Patient currently not on ACE inhibitor or arm secondary to angioedema -Continue aspirin, Coreg, spironolactone, statin, lasix -UOP over past 24 hours 3L (weight down 4lbs since admission) -Follow up with CHF clinic  ?HCAP -patient currently afebrile, no leukocytosis  -Chest x-ray 9/22 show concern for left lower lobe pneumonia versus pulmonary edema -Initially placed on vancomycin and Zosyn -Strep pneumonia urine antigen negative, Legionella urine antigen pending -Blood cultures pending -Repeat CXR: improved aeration of the LLL -Given that patient is afebrile and has no leukocytosis, will not discharge patient with antibiotics  ? Atrial fibrillation  -Patient currently appears to be in sinus rhythm, telemetry strips show sinus rhythm however EKG questionable atrial fibrillation  -Patient was on Xarelto in the past however had systemic bleeding  -CHADSVASC 6  NSVT -Patient had an 11 beat run of VT yesterday -Magnesium checked, 1.4, replaced -Goal: Mg>2, K>4  Chronic kidney disease, stage 3 -Bump in Cr 1.6 (baseline ~1.3), GFR stable- still in Stage 3 -Repeat BMP in one week  Hyperkalemia  -  Upon admission potassium 7.5, this has resolved  currently 3.8  -Potassium replaced. -Repeat BMP in one week  Hypertension -Continue Coreg, Lasix, hydralazine, isosorbide dinitrate, spironolactone  Noncompliance -Had long discussion with patient regarding compliance of medications as well as diet and fluid.  Procedures: Echocardiogram  Consultations: None  Discharge Exam: Filed Vitals:   08/23/15 0400  BP: 103/71  Pulse: 71  Temp: 98.2 F (36.8 C)  Resp: 17   Exam  General: Well developed, well nourished, NAD  HEENT: NCAT, mucous membranes moist.   Cardiovascular: S1 S2 auscultated, RRR, no murmurs  Respiratory: Clear to auscultation bilaterally  Abdomen: Soft, obese, nontender, nondistended, + bowel sounds  Extremities: warm dry without cyanosis clubbing, or edema  Neuro: AAOx3, nonfocal  Psych: Appropriate mood and affect, pleasant  Discharge Instructions     Medication List    ASK your doctor about these medications        allopurinol 100 MG tablet  Commonly known as:  ZYLOPRIM  Take 200 mg by mouth daily.     aspirin EC 81 MG tablet  Take 81 mg by mouth daily.     carvedilol 12.5 MG tablet  Commonly known as:  COREG  Take 12.5 mg by mouth 2 (two) times daily with a meal.     cyanocobalamin 1000 MCG tablet  Take 2,000 mcg by mouth daily.     docusate sodium 100 MG capsule  Commonly known as:  COLACE  Take 100 mg by mouth 2 (two) times daily. For constipation     furosemide 40 MG tablet  Commonly known as:  LASIX  Take 1 tablet (40 mg total) by mouth daily.     glipiZIDE 5 MG tablet  Commonly known as:  GLUCOTROL  Take 5 mg by mouth daily before breakfast.     hydrALAZINE 10 MG tablet  Commonly known as:  APRESOLINE  Take 20 mg by mouth 2 (two) times daily.     HYDROcodone-acetaminophen 5-325 MG per tablet  Commonly known as:  NORCO/VICODIN  Take 1 tablet by mouth every 8 (eight) hours as needed for moderate pain (do not exceed 4 gm of tylenol in 24 ours).     isosorbide  dinitrate 40 MG CR tablet  Commonly known as:  ISOCHRON  Take 40 mg by mouth 3 (three) times daily.     lactobacillus acidophilus Tabs tablet  Take 1 tablet by mouth 2 (two) times daily.     niacin 500 MG tablet  Take 500 mg by mouth at bedtime as needed (Per dr orders).     polyethylene glycol packet  Commonly known as:  MIRALAX / GLYCOLAX  Take 17 g by mouth daily. For constipation     prednisoLONE acetate 0.12 % ophthalmic suspension  Commonly known as:  PRED MILD  Place 1 drop into the right eye 2 (two) times daily.     simvastatin 40 MG tablet  Commonly known as:  ZOCOR  Take 40 mg by mouth daily. For high cholesterol     spironolactone 25 MG tablet  Commonly known as:  ALDACTONE  Take 0.5 tablets (12.5 mg total) by mouth daily.     tamsulosin 0.4 MG Caps capsule  Commonly known as:  FLOMAX  Take 0.4 mg by mouth at bedtime.     traMADol 50 MG tablet  Commonly known as:  ULTRAM  Take 50 mg by mouth 3 (three) times daily as needed for moderate pain.       Allergies  Allergen  Reactions  . Lisinopril Swelling      The results of significant diagnostics from this hospitalization (including imaging, microbiology, ancillary and laboratory) are listed below for reference.    Significant Diagnostic Studies: Dg Chest Portable 1 View  08/21/2015   CLINICAL DATA:  Short of breath  EXAM: PORTABLE CHEST - 1 VIEW  COMPARISON:  Chest CT 05/25/2015  FINDINGS: Normal cardiac silhouette. There is increased opacity projecting over the LEFT lower hemi thorax concerning for airspace disease. No pleural fluid. No pneumothorax. Ectatic aorta.  IMPRESSION: Concern for LEFT lower lobe pneumonia versus pulmonary edema.   Electronically Signed   By: Genevive Bi M.D.   On: 08/21/2015 07:52    Microbiology: No results found for this or any previous visit (from the past 240 hour(s)).   Labs: Basic Metabolic Panel:  Recent Labs Lab 08/21/15 0722 08/21/15 0909 08/22/15 0335  08/22/15 2053 08/23/15 0421  NA 132* 141 140  --  138  K >7.5* 5.1 3.8  --  3.8  CL 100* 105 100*  --  96*  CO2 --  31  GLUCOSE 145* 163* 111*  --  131*  BUN 25* 25* 23*  --  27*  CREATININE 1.24 1.25* 1.40*  --  1.61*  CALCIUM 8.3* 9.0 9.2  --  9.1  MG  --   --   --  1.4* 1.7   Liver Function Tests:  Recent Labs Lab 08/21/15 0722 08/21/15 0909  AST 54* 24  ALT 25 15*  ALKPHOS 47 44  BILITOT 1.7* 0.4  PROT 6.6 6.5  ALBUMIN 3.0* 3.0*   No results for input(s): LIPASE, AMYLASE in the last 168 hours. No results for input(s): AMMONIA in the last 168 hours. CBC:  Recent Labs Lab 08/21/15 0722 08/23/15 0421  WBC 7.8 8.1  NEUTROABS 4.5  --   HGB 12.0* 11.4*  HCT 36.0* 34.6*  MCV 96.0 95.8  PLT 245 218   Cardiac Enzymes:  Recent Labs Lab 08/21/15 1100 08/21/15 1628 08/21/15 2137  TROPONINI 0.06* 0.05* 0.05*   BNP: BNP (last 3 results)  Recent Labs  01/22/15 1244 08/21/15 0722  BNP 206.4* 219.6*    ProBNP (last 3 results) No results for input(s): PROBNP in the last 8760 hours.  CBG:  Recent Labs Lab 08/22/15 0603 08/22/15 1145 08/22/15 1653 08/22/15 2117 08/23/15 0600  GLUCAP 168* 138* 106* 138* 122*       Signed:  MIKHAIL, MARYANN  [facility] 08/23/2015, 9:43 AM  Addendum Patient was discharged to home 08/24/2015.  Legionella urine antigen finally resulted today 08/25/2015, and was found to be positive.  [facility] I was not able to call in a prescription for him.  Contacted patient's PCP, Dr. Tereasa Coop, who graciously agreed to call in Azithromycin  for 10 days.  Will fax discharge summary [phone].    MIKHAIL, MARYANN D.O. [facility] Pager [phone]  If 7PM-7AM, please contact night-coverage [email or web address] Password [facility] 08/25/2015, 12:55 PM

## 2015-08-23 NOTE — Progress Notes (Signed)
Discharge instructions given. Pt verbalized understanding and all questions were answered.  

## 2015-08-25 LAB — LEGIONELLA PNEUMOPHILA SEROGP 1 UR AG: L. PNEUMOPHILA SEROGP 1 UR AG: POSITIVE — AB

## 2015-08-25 NOTE — Progress Notes (Signed)
Follow up call from attending, Dr Catha Gosselin. Requested pt dc summary and H&P faxed to [facility] PCP. Faxed discharge summary and H&P to [facility], Dr. Tereasa Coop fax [phone] , phone [phone]. Isidoro Donning RN CCM Case Mgmt phone [phone]

## 2015-08-25 NOTE — Progress Notes (Signed)
Lab called with critical: urine legionella positive.  Dr. Catha Gosselin made aware.

## 2015-08-26 NOTE — Progress Notes (Signed)
Cardiology Office Note   Date:  08/27/2015   ID:  Ryan Macdonald, DOB February 10, 1934, MRN [id number]  PCP:  [facility]  Cardiologist:   Madilyn Hook, MD   Chief Complaint  Patient presents with  . New Evaluation    Post hospital. Patient has no compliants.      History of Present Illness: Ryan Macdonald is a 81 y.o. male with chronic systolic heart failure (LVEF 15%, nonischemic), NSVT, paroxysmal atrial fibrillation, prior PE, hypertension, hyperlipidemia, and CKD III who presents for follow up after a recent hospitalization for decompensated heart failure.  [name] was admitted 9/22-9/24.  He had a heart failure exacerbation in the setting of eating [place] food and missing his PM.  During that hospitalization he had an 11 beat run of NSVT.  At the time his magnesium level was 1.4.  Discharge weight was 266 lb.  He was also hospitalized for heart failure in June, at which time he was also intubated.  Since discharge [name] is been doing well. He denies any chest pain or shortness of breath. He is sleeping on one pillow and denies any PND. His lower extremity edema has been under good control. He weighs himself daily. On the day of discharge she weighed 263. Yesterday he was 261.4 and today he was 267. He has been limiting his fluid intake and watching how much sodium he takes in. His white counts his sodium intake daily and does not cook with added salt.  Mr. Lovern had an episode of atrial fibrillation postoperatively in 2014.  He was initially on Xarelto but had hematuria, so it was discontinued.  To his knowledge, he has not had any recurrent atrial fibrillation.   Past Medical History  Diagnosis Date  . Nonischemic cardiomyopathy     a. ? 2009 Cath in [place] - nl cors per pt;  b. 09/2013 Echo: EF 25-30%, sev diff HK.  Marland Kitchen Hypertension   . Cat scratch fever     removed mass on right side neck  . Cataracts, bilateral   . High cholesterol   .  Osteoarthritis   . Kidney stones   . Morbid obesity   . PAF (paroxysmal atrial fibrillation)     a. post-op hip in 2014 - prev on xarelto.  . CHF (congestive heart failure)   . Shortness of breath dyspnea   . Diabetes mellitus without complication     TYPE 2    Past Surgical History  Procedure Laterality Date  . Kidney stone surgery Right 1970's; 1983    "twice"  . Tonsillectomy and adenoidectomy  1940's  . Cystoscopy with urethral dilatation  10/23/2013  . Total hip arthroplasty Left 10/23/2013  . Inguinal hernia repair Right 1950's  . Circumcision  1940's  . Incision and drainage of wound Right 1980's    "cat scratch" (10/23/2013)  . Cardiac catheterization  1980's  . Total hip arthroplasty Left 10/23/2013    Procedure: TOTAL HIP ARTHROPLASTY;  Surgeon: Valeria Batman, MD;  Location: [facility] OR;  Service: Orthopedics;  Laterality: Left;  . Cystoscopy with urethral dilatation N/A 10/23/2013    Procedure: CYSTOSCOPY WITH URETHRAL DILATATION;  Surgeon: Kathi Ludwig, MD;  Location: [facility] OR;  Service: Urology;  Laterality: N/A;     Current Outpatient Prescriptions  Medication Sig Dispense Refill  . allopurinol (ZYLOPRIM) 100 MG tablet Take 200 mg by mouth daily.    Marland Kitchen aspirin EC 81 MG tablet Take 81 mg by mouth daily.    Marland Kitchen  carvedilol (COREG) 12.5 MG tablet Take 12.5 mg by mouth 2 (two) times daily with a meal.    . cyanocobalamin 1000 MCG tablet Take 2,000 mcg by mouth daily.    Marland Kitchen docusate sodium (COLACE) 100 MG capsule Take 100 mg by mouth 2 (two) times daily. For constipation    . furosemide (LASIX) 40 MG tablet Take 1 tablet (40 mg total) by mouth daily.    Marland Kitchen glipiZIDE (GLUCOTROL) 5 MG tablet Take 5 mg by mouth daily before breakfast.    . hydrALAZINE (APRESOLINE) 10 MG tablet Take 20 mg by mouth 2 (two) times daily.    Marland Kitchen HYDROcodone-acetaminophen (NORCO/VICODIN) 5-325 MG per tablet Take 1 tablet by mouth every 8 (eight) hours as needed for moderate pain (do not exceed 4 gm  of tylenol in 24 ours). 90 tablet 0  . isosorbide dinitrate (DILATRATE-SR) 40 MG CR capsule Take 1 capsule (40 mg total) by mouth every 12 (twelve) hours. 60 capsule 11  . lactobacillus acidophilus (BACID) TABS tablet Take 1 tablet by mouth 2 (two) times daily.    . niacin 500 MG tablet Take 500 mg by mouth at bedtime as needed (Per dr orders).    . polyethylene glycol (MIRALAX / GLYCOLAX) packet Take 17 g by mouth daily. For constipation    . simvastatin (ZOCOR) 40 MG tablet Take 40 mg by mouth daily. For high cholesterol    . spironolactone (ALDACTONE) 25 MG tablet Take 0.5 tablets (12.5 mg total) by mouth daily.    . tamsulosin (FLOMAX) 0.4 MG CAPS capsule Take 0.4 mg by mouth at bedtime.    . traMADol (ULTRAM) 50 MG tablet Take 50 mg by mouth 3 (three) times daily as needed for moderate pain.      No current facility-administered medications for this visit.    Allergies:   Lisinopril    Social History:  The patient  reports that he has quit smoking. His smoking use included Cigarettes and Cigars. He has a 15 pack-year smoking history. He has never used smokeless tobacco. He reports that he does not drink alcohol or use illicit drugs.   Family History:  The patient's family history includes Other in an other family member.    ROS:  Please see the history of present illness.   Otherwise, review of systems are positive for none.   All other systems are reviewed and negative.    PHYSICAL EXAM: VS:  BP 144/82 mmHg  Pulse 82  Ht  (1.702 m)  Wt 120.203 kg (265 lb)  BMI 41.50 kg/m2 , BMI Body mass index is 41.5 kg/(m^2). GENERAL:  Well appearing HEENT:  Pupils equal round and reactive, fundi not visualized, oral mucosa unremarkable NECK:  JVP 1cm above clavicle at 45 degrees, waveform within normal limits, carotid upstroke brisk and symmetric, no bruits, no thyromegaly LYMPHATICS:  No cervical adenopathy LUNGS:  Clear to auscultation bilaterally HEART:  RRR.  PMI not displaced or  sustained,S1 and S2 within normal limits, no S3, no S4, no clicks, no rubs, no murmurs ABD:  Flat, positive bowel sounds normal in frequency in pitch, no bruits, no rebound, no guarding, no midline pulsatile mass, no hepatomegaly, no splenomegaly EXT:  2 plus pulses throughout, no edema, no cyanosis no clubbing SKIN:  No rashes no nodules NEURO:  Cranial nerves II through XII grossly intact, motor grossly intact throughout PSYCH:  Cognitively intact, oriented to person place and time    EKG:  EKG is ordered today. The ekg ordered  today demonstrates sinus rhythm with sinus arrhythmia.  Non-specific t wave abnormalities.   Recent Labs: 08/21/2015: ALT 15*; B Natriuretic Peptide 219.6* 08/23/2015: BUN 27*; Creatinine, Ser 1.61*; Hemoglobin 11.4*; Magnesium 1.7; Platelets 218; Potassium 3.8; Sodium 138    Lipid Panel    Component Value Date/Time   CHOL 159 05/26/2015 0409   TRIG 66 05/26/2015 0409   HDL 35* 05/26/2015 0409   CHOLHDL 4.5 05/26/2015 0409   VLDL 13 05/26/2015 0409   LDLCALC 111* 05/26/2015 0409      Wt Readings from Last 3 Encounters:  08/27/15 120.203 kg (265 lb)  08/23/15 121.065 kg (266 lb 14.4 oz)  06/09/15 116.121 kg (256 lb)    Echo 08/22/15: Study Conclusions  - Left ventricle: The cavity size was severely dilated. Systolic function was severely reduced. The estimated ejection fraction was in the range of 15-20%. Diffuse hypokinesis. - Aortic valve: There was trivial regurgitation. - Mitral valve: There was mild regurgitation. - Right ventricle: Systolic function was normal. - Right atrium: The atrium was normal in size. - Tricuspid valve: There was trivial regurgitation.   Other studies Reviewed: Additional studies/ records that were reviewed today include: . Review of the above records demonstrates:  Please see elsewhere in the note.     ASSESSMENT AND PLAN:  # Chronic systolic heart failure: Mr. Kiss has been hospitalized multiple times  for decompensated heart failure. His ejection fraction continues to decline. Unfortunately he has not a candidate for ace inhibitor, ARB, or Entresto given that he had angioedema with lisinopril. We discussed the fact that he is a candidate for ICD placement. He has declined this in the past because several of his friends died despite having a defibrillator placed. We discussed the fact that a defibrillator will not prevent all types of death, it only works for malignant ventricular arrhythmias. He will continue to think about this and we will discuss it at his follow-up appointment. Given that his weight has increased we recommend that he double his Lasix dose for today and recheck his weight tomorrow. He understands that should his weight increases by more than 2 pounds in one day or 5 pounds in a week, he should increase his Lasix. If his weight is not down within the next day or 2 he should give [place] a call for additional recommendations or to be seen in clinic.  He does not have any new symptoms that warrant a new ischemia evaluation. - Continue carvedilol 12.5mg  q12h, spironolactone 12.5mg , hydralazine  bid and isosorbide dinitrate  bid - Consider ICD at follow up appointment - Continue lasix  po bid.  Increase to  bid when weight is up more than 2 pounds in a day or 5 pounds in a week - Check BMP and Mg  # Hyperlipidemia: Continue simvastatin.  Will likely switch to high dose atorvastatin or rosuvastatin at next appointment given ASCVD 10 year risk of 30.9%  # Obesity: Patient wonders whether he can start an exercise regimen by walking.  This was encouraged.  Current medicines are reviewed at length with the patient today.  The patient does not have concerns regarding medicines.  The following changes have been made:  no change  Labs/ tests ordered today include:   Orders Placed This Encounter  Procedures  . Basic metabolic panel  . Magnesium  . EKG 12-Lead      Disposition:   FU with Tiffany C. Duke Salvia, MD in 3 months.    Signed, Madilyn Hook, MD  08/27/2015 9:41 AM    [facility]

## 2015-08-27 ENCOUNTER — Ambulatory Visit (INDEPENDENT_AMBULATORY_CARE_PROVIDER_SITE_OTHER): Payer: Non-veteran care | Admitting: Cardiovascular Disease

## 2015-08-27 ENCOUNTER — Encounter: Payer: Self-pay | Admitting: Cardiovascular Disease

## 2015-08-27 ENCOUNTER — Telehealth: Payer: Self-pay | Admitting: Cardiovascular Disease

## 2015-08-27 VITALS — BP 144/82 | HR 82 | Ht 67.0 in | Wt 265.0 lb

## 2015-08-27 DIAGNOSIS — Z79899 Other long term (current) drug therapy: Secondary | ICD-10-CM | POA: Diagnosis not present

## 2015-08-27 DIAGNOSIS — I5022 Chronic systolic (congestive) heart failure: Secondary | ICD-10-CM | POA: Diagnosis not present

## 2015-08-27 DIAGNOSIS — I10 Essential (primary) hypertension: Secondary | ICD-10-CM

## 2015-08-27 LAB — CULTURE, BLOOD (ROUTINE X 2)
Culture: NO GROWTH
Culture: NO GROWTH

## 2015-08-27 LAB — BASIC METABOLIC PANEL
BUN: 33 mg/dL — ABNORMAL HIGH (ref 7–25)
CALCIUM: 9.8 mg/dL (ref 8.6–10.3)
CO2: 29 mmol/L (ref 20–31)
CREATININE: 1.41 mg/dL — AB (ref 0.70–1.18)
Chloride: 98 mmol/L (ref 98–110)
Glucose, Bld: 138 mg/dL — ABNORMAL HIGH (ref 65–99)
Potassium: 4.6 mmol/L (ref 3.5–5.3)
Sodium: 138 mmol/L (ref 135–146)

## 2015-08-27 LAB — MAGNESIUM: Magnesium: 1.9 mg/dL (ref 1.5–2.5)

## 2015-08-27 MED ORDER — ISOSORBIDE DINITRATE ER 40 MG PO CPCR: 40.0000 mg | ORAL_CAPSULE | Freq: Two times a day (BID) | ORAL | Status: DC

## 2015-08-27 NOTE — Telephone Encounter (Signed)
Returned patient's call.   Told patient (during office visit and over the phone) that I could not fax his prescription to the [place] - the fax would be void. As far as an explanation, I consulted with Dr Duke Salvia.  Per Dr Duke Salvia, patient increased Isosorbide to 40 mg BID on his own. Dr Duke Salvia was okay with this increase due to his uncontrolled hypertension and heart failure.   Patient states he has a visit at the [facility] on 09/15/2015. Patient has enough medication to last him until this appointment and that if Dr Cleon Dew had any questions or need for explanation, he'd have Dr Cleon Dew call our office at that particular time.

## 2015-08-27 NOTE — Patient Instructions (Signed)
Your physician recommends that you return for lab work TODAY.  Dr Duke Salvia recommends that you schedule a follow-up appointment in 3 months.

## 2015-08-27 NOTE — Telephone Encounter (Signed)
Pt called in requesting that his prescription for Isosorbide be faxed to the [facility] in [place]([phone]). He also said to include an explanation for Dr. Cleon Dew( at the [facility]) as to why she is increasing the prescription from 20mg  to 40mg . A call back number at the [place] is [phone]  Thanks

## 2015-08-28 ENCOUNTER — Telehealth: Payer: Self-pay | Admitting: *Deleted

## 2015-08-28 NOTE — Telephone Encounter (Signed)
-----   Message from [name], MD sent at 08/28/2015  9:16 AM EDT ----- Labs stable.  Kidney function improving.  Continue current plan.

## 2015-08-28 NOTE — Telephone Encounter (Signed)
Spoke to patient. Result given . Verbalized understanding Patient states  He has loss 3 lbs . Patient wanted Dr Ryan Macdonald to know, and appreciate her care towards him

## 2015-10-20 ENCOUNTER — Encounter (HOSPITAL_COMMUNITY): Payer: Self-pay | Admitting: *Deleted

## 2015-10-20 ENCOUNTER — Emergency Department (HOSPITAL_COMMUNITY)
Admission: EM | Admit: 2015-10-20 | Discharge: 2015-10-20 | Disposition: A | Discharge status: Home / Self Care | Payer: Non-veteran care | Attending: Emergency Medicine | Admitting: Emergency Medicine

## 2015-10-20 DIAGNOSIS — M1990 Unspecified osteoarthritis, unspecified site: Secondary | ICD-10-CM | POA: Diagnosis not present

## 2015-10-20 DIAGNOSIS — Z7982 Long term (current) use of aspirin: Secondary | ICD-10-CM | POA: Diagnosis not present

## 2015-10-20 DIAGNOSIS — Z87442 Personal history of urinary calculi: Secondary | ICD-10-CM | POA: Insufficient documentation

## 2015-10-20 DIAGNOSIS — Z87891 Personal history of nicotine dependence: Secondary | ICD-10-CM | POA: Insufficient documentation

## 2015-10-20 DIAGNOSIS — I480 Paroxysmal atrial fibrillation: Secondary | ICD-10-CM | POA: Insufficient documentation

## 2015-10-20 DIAGNOSIS — I509 Heart failure, unspecified: Secondary | ICD-10-CM | POA: Diagnosis not present

## 2015-10-20 DIAGNOSIS — E119 Type 2 diabetes mellitus without complications: Secondary | ICD-10-CM | POA: Diagnosis not present

## 2015-10-20 DIAGNOSIS — R040 Epistaxis: Secondary | ICD-10-CM | POA: Insufficient documentation

## 2015-10-20 DIAGNOSIS — Z79899 Other long term (current) drug therapy: Secondary | ICD-10-CM | POA: Insufficient documentation

## 2015-10-20 DIAGNOSIS — I10 Essential (primary) hypertension: Secondary | ICD-10-CM | POA: Diagnosis not present

## 2015-10-20 DIAGNOSIS — E7800 Pure hypercholesterolemia, unspecified: Secondary | ICD-10-CM | POA: Diagnosis not present

## 2015-10-20 DIAGNOSIS — Z8669 Personal history of other diseases of the nervous system and sense organs: Secondary | ICD-10-CM | POA: Diagnosis not present

## 2015-10-20 LAB — CBG MONITORING, ED: Glucose-Capillary: 121 mg/dL — ABNORMAL HIGH (ref 65–99)

## 2015-10-20 MED ORDER — OXYMETAZOLINE HCL 0.05 % NA SOLN
1.0000 | Freq: Once | NASAL | Status: AC
  Administered 2015-10-20: 1 via NASAL
  Filled 2015-10-20: qty 15

## 2015-10-20 NOTE — ED Provider Notes (Signed)
CSN: [id number]     Arrival date & time 10/20/15  1032 History   First MD Initiated Contact with Patient 10/20/15 1218     Chief Complaint  Patient presents with  . Epistaxis     (Consider location/radiation/quality/duration/timing/severity/associated sxs/prior Treatment) Patient is a 81 y.o. male presenting with nosebleeds. The history is provided by the patient.  Epistaxis Location:  R nare Severity:  Mild Duration:  3 hours Timing:  Constant Progression:  Resolved Chronicity:  New Context: aspirin use (81 mg daily)   Context: not anticoagulants and not home oxygen   Relieved by:  Applying pressure Associated symptoms: no congestion, no cough, no fever and no headaches   Risk factors: no alcohol use and no allergies     Past Medical History  Diagnosis Date  . Nonischemic cardiomyopathy (HCC)     a. ? 2009 Cath in [place] - nl cors per pt;  b. 09/2013 Echo: EF 25-30%, sev diff HK.  Marland Kitchen Hypertension   . Cat scratch fever     removed mass on right side neck  . Cataracts, bilateral   . High cholesterol   . Osteoarthritis   . Kidney stones   . Morbid obesity (HCC)   . PAF (paroxysmal atrial fibrillation) (HCC)     a. post-op hip in 2014 - prev on xarelto.  . CHF (congestive heart failure) (HCC)   . Shortness of breath dyspnea   . Diabetes mellitus without complication (HCC)     TYPE 2   Past Surgical History  Procedure Laterality Date  . Kidney stone surgery Right 1970's; 1983    "twice"  . Tonsillectomy and adenoidectomy  1940's  . Cystoscopy with urethral dilatation  10/23/2013  . Total hip arthroplasty Left 10/23/2013  . Inguinal hernia repair Right 1950's  . Circumcision  1940's  . Incision and drainage of wound Right 1980's    "cat scratch" (10/23/2013)  . Cardiac catheterization  1980's  . Total hip arthroplasty Left 10/23/2013    Procedure: TOTAL HIP ARTHROPLASTY;  Surgeon: Valeria Batman, MD;  Location: [facility] OR;  Service: Orthopedics;  Laterality: Left;  .  Cystoscopy with urethral dilatation N/A 10/23/2013    Procedure: CYSTOSCOPY WITH URETHRAL DILATATION;  Surgeon: Kathi Ludwig, MD;  Location: [facility] OR;  Service: Urology;  Laterality: N/A;   Family History  Problem Relation Age of Onset  . Other      negative for premature CAD  . Heart Problems Maternal Grandmother    Social History  Substance Use Topics  . Smoking status: Former Smoker -- 1.00 packs/day for 15 years    Types: Cigarettes, Cigars  . Smokeless tobacco: Never Used     Comment: quit smoking ~ 40 yr ago  . Alcohol Use: No     Comment: rare beer.    Review of Systems  Constitutional: Negative for fever.  HENT: Positive for nosebleeds. Negative for congestion.   Respiratory: Negative for cough.   Neurological: Negative for headaches.  All other systems reviewed and are negative.     Allergies  Lisinopril  Home Medications   Prior to Admission medications   Medication Sig Start Date End Date Taking? Authorizing Provider  allopurinol (ZYLOPRIM) 100 MG tablet Take 200 mg by mouth daily.   Yes Historical Provider, MD  aspirin EC 81 MG tablet Take 81 mg by mouth daily.   Yes Historical Provider, MD  carvedilol (COREG) 12.5 MG tablet Take 12.5 mg by mouth 2 (two) times daily with a meal.  Yes Historical Provider, MD  cyanocobalamin 1000 MCG tablet Take 2,000 mcg by mouth daily.   Yes Historical Provider, MD  docusate sodium (COLACE) 100 MG capsule Take 100 mg by mouth 2 (two) times daily. For constipation   Yes Historical Provider, MD  furosemide (LASIX) 40 MG tablet Take 1 tablet (40 mg total) by mouth daily. 05/27/15  Yes Clydia Llano, MD  glipiZIDE (GLUCOTROL) 5 MG tablet Take 5 mg by mouth daily before breakfast.   Yes Historical Provider, MD  hydrALAZINE (APRESOLINE) 10 MG tablet Take 20 mg by mouth 2 (two) times daily.   Yes Historical Provider, MD  isosorbide dinitrate (DILATRATE-SR) 40 MG CR capsule Take 1 capsule (40 mg total) by mouth every 12 (twelve)  hours. 08/27/15  Yes [name], MD  lactobacillus acidophilus (BACID) TABS tablet Take 1 tablet by mouth 2 (two) times daily.   Yes Historical Provider, MD  niacin 500 MG tablet Take 500 mg by mouth at bedtime as needed. For pain   Yes Historical Provider, MD  polyethylene glycol (MIRALAX / GLYCOLAX) packet Take 17 g by mouth daily. For constipation   Yes Historical Provider, MD  simvastatin (ZOCOR) 40 MG tablet Take 40 mg by mouth daily. For high cholesterol   Yes Historical Provider, MD  spironolactone (ALDACTONE) 25 MG tablet Take 0.5 tablets (12.5 mg total) by mouth daily. 05/27/15  Yes Clydia Llano, MD  tamsulosin (FLOMAX) 0.4 MG CAPS capsule Take 0.4 mg by mouth at bedtime.   Yes Historical Provider, MD  traMADol (ULTRAM) 50 MG tablet Take 50 mg by mouth 3 (three) times daily as needed for moderate pain.    Yes Historical Provider, MD  HYDROcodone-acetaminophen (NORCO/VICODIN) 5-325 MG per tablet Take 1 tablet by mouth every 8 (eight) hours as needed for moderate pain (do not exceed 4 gm of tylenol in 24 ours). 06/06/15   Monica Carter, DO   BP 135/119 mmHg  Pulse 75  Temp(Src) 97.7 F (36.5 C) (Oral)  Resp 18  SpO2 95% Physical Exam  Constitutional: He is oriented to person, place, and time. He appears well-developed and well-nourished. No distress.  HENT:  Head: Normocephalic and atraumatic.  Nose: No nasal septal hematoma. Epistaxis: dried blood in right nare.  Eyes: Conjunctivae are normal.  Neck: Neck supple. No tracheal deviation present.  Cardiovascular: Normal rate and regular rhythm.   Pulmonary/Chest: Effort normal. No respiratory distress.  Abdominal: Soft. He exhibits no distension.  Neurological: He is alert and oriented to person, place, and time.  Skin: Skin is warm and dry.  Psychiatric: He has a normal mood and affect.    ED Course  Procedures (including critical care time) Labs Review Labs Reviewed  CBG MONITORING, ED - Abnormal; Notable for the  following:    Glucose-Capillary 121 (*)    All other components within normal limits    Imaging Review No results found. I have personally reviewed and evaluated these images and lab results as part of my medical decision-making.   EKG Interpretation None      MDM   Final diagnoses:  Anterior epistaxis    81 y.o. male presents with right nare epistaxis. Self limited with direct pressure. No evidence of coagulopathy or complication of epistaxis. Recommended supportive therapy and monitoring. Plan to follow up with PCP as needed and return precautions discussed for worsening or new concerning symptoms.     Lyndal Pulley, MD 10/22/15 [phone]

## 2015-10-20 NOTE — Discharge Instructions (Signed)

## 2015-10-20 NOTE — ED Notes (Signed)
Pt reports onset approx 30 mins ago of right side nosebleed. Bleeding controlled at this time.

## 2015-11-17 NOTE — Progress Notes (Signed)
This encounter was created in error - please disregard.

## 2015-11-18 ENCOUNTER — Encounter: Payer: Medicare Other | Admitting: Cardiovascular Disease

## 2015-11-18 DIAGNOSIS — R0989 Other specified symptoms and signs involving the circulatory and respiratory systems: Secondary | ICD-10-CM

## 2015-11-19 ENCOUNTER — Ambulatory Visit: Payer: Medicare Other | Admitting: Podiatry

## 2015-11-26 ENCOUNTER — Encounter: Payer: Self-pay | Admitting: *Deleted

## 2015-11-28 ENCOUNTER — Encounter: Payer: Self-pay | Admitting: Podiatry

## 2015-11-28 ENCOUNTER — Ambulatory Visit (INDEPENDENT_AMBULATORY_CARE_PROVIDER_SITE_OTHER): Payer: No Typology Code available for payment source | Admitting: Podiatry

## 2015-11-28 VITALS — BP 107/60 | HR 77 | Resp 12

## 2015-11-28 DIAGNOSIS — M79675 Pain in left toe(s): Secondary | ICD-10-CM | POA: Diagnosis not present

## 2015-11-28 DIAGNOSIS — B351 Tinea unguium: Secondary | ICD-10-CM | POA: Diagnosis not present

## 2015-11-28 DIAGNOSIS — M79674 Pain in right toe(s): Secondary | ICD-10-CM | POA: Diagnosis not present

## 2015-11-28 DIAGNOSIS — L84 Corns and callosities: Secondary | ICD-10-CM | POA: Diagnosis not present

## 2015-11-28 DIAGNOSIS — B353 Tinea pedis: Secondary | ICD-10-CM | POA: Diagnosis not present

## 2015-11-28 NOTE — Progress Notes (Signed)
   Subjective:    Patient ID: Ryan Macdonald, male    DOB: 1934-11-12, 81 y.o.   MRN: [id number]  HPI 81 year old male presents the office today for concerns of pain between his fourth and fifth toes on the right foot which has been ongoing for several years. He has a states that he has a fungus infection between his toes which is not resolving. He is using prescribed nystatin and urea cream without any relief. He is also asking for his nails be trimmed today as they are thickened elongated he cannot trim them himself. His nails are painful particular shoe gear pressure. No redness or drainage. No other complaints at this time.   Review of Systems  All other systems reviewed and are negative.      Objective:   Physical Exam General: AAO x3, NAD  Dermatological: Nails appear to be hypertrophic, dystrophic, brittle, discolored, elongated 10. No surrounding erythema or drainage. Tenderness in nails 1-5 bilaterally. On the fourth interspace of the right foot there is a hyperkeratotic lesion present. There does appear to be dry, scaling, erythematous skin the interspaces although mild. No drainage or pus. Upon debridement no underlying ulceration, drainage or other signs of infection.  Vascular: Dorsalis Pedis artery and Posterior Tibial artery pedal pulses are 2/4 bilateral with immedate capillary fill time. Pedal hair growth present. No varicosities and no lower extremity edema present bilateral. There is no pain with calf compression, swelling, warmth, erythema.   Neruologic: Grossly intact via light touch bilateral. Vibratory intact via tuning fork bilateral. Protective threshold with Semmes Wienstein monofilament intact to all pedal sites bilateral. Patellar and Achilles deep tendon reflexes 2+ bilateral. No Babinski or clonus noted bilateral.   Musculoskeletal: Hammertoes are present No gross boney pedal deformities bilateral. No pain, crepitus, or limitation noted with foot and ankle range  of motion bilateral. Muscular strength 5/5 in all groups tested bilateral.  Gait: Unassisted, Nonantalgic.      Assessment & Plan:  81 year old male with symptomatic onychomycosis, heloma molle, tinea pedis -Treatment options discussed including all alternatives, risks, and complications -Etiology of symptoms were discussed -Nail sharply debrided 10 without complication/bleeding -Prescribed ketoconazole cream -Debrided hyperkeratotic lesion right fourth interspace without complication/bleeding -Follow-up in 3 months or sooner if any problems arise. In the meantime, encouraged to call the office with any questions, concerns, change in symptoms.   Ovid Curd, DPM

## 2015-12-02 ENCOUNTER — Encounter: Payer: Self-pay | Admitting: Podiatry

## 2015-12-17 ENCOUNTER — Encounter: Payer: Self-pay | Admitting: Cardiovascular Disease

## 2015-12-17 ENCOUNTER — Ambulatory Visit (INDEPENDENT_AMBULATORY_CARE_PROVIDER_SITE_OTHER): Payer: No Typology Code available for payment source | Admitting: Cardiovascular Disease

## 2015-12-17 VITALS — BP 106/76 | HR 88 | Ht 67.0 in | Wt 269.4 lb

## 2015-12-17 DIAGNOSIS — Z95810 Presence of automatic (implantable) cardiac defibrillator: Secondary | ICD-10-CM | POA: Insufficient documentation

## 2015-12-17 DIAGNOSIS — I5022 Chronic systolic (congestive) heart failure: Secondary | ICD-10-CM

## 2015-12-17 DIAGNOSIS — I480 Paroxysmal atrial fibrillation: Secondary | ICD-10-CM | POA: Diagnosis not present

## 2015-12-17 DIAGNOSIS — E785 Hyperlipidemia, unspecified: Secondary | ICD-10-CM

## 2015-12-17 NOTE — Patient Instructions (Signed)
Dr Duke Salvia has made no changes today in your current medications or treatment plan.  Your physician recommends that you schedule a follow-up appointment in 6 months. You will receive a reminder letter in the mail two months in advance. If you don't receive a letter, please call our office to schedule the follow-up appointment.  If you need a refill on your cardiac medications before your next appointment, please call your pharmacy.

## 2015-12-17 NOTE — Progress Notes (Signed)
Cardiology Office Note   Date:  12/17/2015   ID:  Ryan Macdonald, DOB Nov 04, 1934, MRN [id number]  PCP:  [facility]  Cardiologist:   Madilyn Hook, MD   Chief Complaint  Patient presents with  . 3 month visit    defibrillator//pt states he is having trouble sleeping at night--sleeps during the day  . Dizziness    occasionally when he gets up too fast--pt states just this morning      Patient ID: Ryan Macdonald is a 82 y.o. male with chronic systolic heart failure (LVEF 15%, nonischemic), NSVT, paroxysmal atrial fibrillation, prior PE, hypertension, hyperlipidemia, and CKD III who presents for follow on chronic systolic heart failure.    Interval History 12/17/15: Since his last appointment [name] had a Biotronik ICD implanted at the [facility] on 12/21.  There were no complications and he tolerated the procedure well. Since that time he denies any ICD firing. He is scheduled to follow-up at the [place] next month. He would like to have his device checks here in our clinic once he is cleared by them.  [name] has been doing well. He denies any chest pain or shortness of breath. He has not noted any lower extremity edema, orthopnea, or PND.  He weighs himself daily and his weight has been stable.  At his last appointment [name] was encouraged to increase his exercise.   He has been doing exercises while sitting in a chair.  He also does calf raises and other calisthenics.  He does this 5 days per week.  [name]  Noted some dizziness when he first got up this morning.  It is worse first thing in the morning and improves after eating and taking his medications.    History of Present Illness 08/26/15: [name] was admitted 9/22-9/24.  He had a heart failure exacerbation in the setting of eating Chinese food and missing his PM.  During that hospitalization he had an 11 beat run of NSVT.  At the time his magnesium level was 1.4.   Discharge weight was 266 lb.  He was also hospitalized for heart failure in June, at which time he was also intubated.  Since discharge [name] is been doing well. He denies any chest pain or shortness of breath. He is sleeping on one pillow and denies any PND. His lower extremity edema has been under good control. He weighs himself daily. On the day of discharge she weighed 263. Yesterday he was 261.4 and today he was 267. He has been limiting his fluid intake and watching how much sodium he takes in. His white counts his sodium intake daily and does not cook with added salt.  [name] had an episode of atrial fibrillation postoperatively in 2014.  He was initially on Xarelto but had hematuria, so it was discontinued.  To his knowledge, he has not had any recurrent atrial fibrillation.   Past Medical History  Diagnosis Date  . Nonischemic cardiomyopathy (HCC)     a. ? 2009 Cath in [place] - nl cors per pt;  b. 09/2013 Echo: EF 25-30%, sev diff HK.  Marland Kitchen Hypertension   . Cat scratch fever     removed mass on right side neck  . Cataracts, bilateral   . High cholesterol   . Osteoarthritis   . Kidney stones   . Morbid obesity (HCC)   . PAF (paroxysmal atrial fibrillation) (HCC)     a. post-op hip  in 2014 - prev on xarelto.  . CHF (congestive heart failure) (HCC)   . Shortness of breath dyspnea   . Diabetes mellitus without complication (HCC)     TYPE 2    Past Surgical History  Procedure Laterality Date  . Kidney stone surgery Right 1970's; 1983    "twice"  . Tonsillectomy and adenoidectomy  1940's  . Cystoscopy with urethral dilatation  10/23/2013  . Total hip arthroplasty Left 10/23/2013  . Inguinal hernia repair Right 1950's  . Circumcision  1940's  . Incision and drainage of wound Right 1980's    "cat scratch" (10/23/2013)  . Cardiac catheterization  1980's  . Total hip arthroplasty Left 10/23/2013    Procedure: TOTAL HIP ARTHROPLASTY;  Surgeon: Valeria Batman, MD;   Location: [facility] OR;  Service: Orthopedics;  Laterality: Left;  . Cystoscopy with urethral dilatation N/A 10/23/2013    Procedure: CYSTOSCOPY WITH URETHRAL DILATATION;  Surgeon: Kathi Ludwig, MD;  Location: [facility] OR;  Service: Urology;  Laterality: N/A;     Current Outpatient Prescriptions  Medication Sig Dispense Refill  . allopurinol (ZYLOPRIM) 100 MG tablet Take 200 mg by mouth daily.    Marland Kitchen aspirin EC 81 MG tablet Take 81 mg by mouth daily.    . carvedilol (COREG) 12.5 MG tablet Take 12.5 mg by mouth 2 (two) times daily with a meal.    . cyanocobalamin 1000 MCG tablet Take 2,000 mcg by mouth daily.    Marland Kitchen docusate sodium (COLACE) 100 MG capsule Take 100 mg by mouth 2 (two) times daily. For constipation    . furosemide (LASIX) 40 MG tablet Take 1 tablet (40 mg total) by mouth daily.    Marland Kitchen glipiZIDE (GLUCOTROL) 5 MG tablet Take 5 mg by mouth daily before breakfast.    . hydrALAZINE (APRESOLINE) 10 MG tablet Take 20 mg by mouth 2 (two) times daily.    Marland Kitchen HYDROcodone-acetaminophen (NORCO/VICODIN) 5-325 MG per tablet Take 1 tablet by mouth every 8 (eight) hours as needed for moderate pain (do not exceed 4 gm of tylenol in 24 ours). 90 tablet 0  . isosorbide dinitrate (DILATRATE-SR) 40 MG CR capsule Take 1 capsule (40 mg total) by mouth every 12 (twelve) hours. 60 capsule 11  . lactobacillus acidophilus (BACID) TABS tablet Take 1 tablet by mouth 2 (two) times daily.    . niacin 500 MG tablet Take 500 mg by mouth at bedtime as needed. For pain    . polyethylene glycol (MIRALAX / GLYCOLAX) packet Take 17 g by mouth daily. For constipation    . simvastatin (ZOCOR) 40 MG tablet Take 40 mg by mouth daily. For high cholesterol    . spironolactone (ALDACTONE) 25 MG tablet Take 0.5 tablets (12.5 mg total) by mouth daily.    . tamsulosin (FLOMAX) 0.4 MG CAPS capsule Take 0.4 mg by mouth at bedtime.    . traMADol (ULTRAM) 50 MG tablet Take 50 mg by mouth 3 (three) times daily as needed for moderate pain.        No current facility-administered medications for this visit.    Allergies:   Lisinopril    Social History:  The patient  reports that he has quit smoking. His smoking use included Cigarettes and Cigars. He has a 15 pack-year smoking history. He has never used smokeless tobacco. He reports that he does not drink alcohol or use illicit drugs.   Family History:  The patient's family history includes Heart Problems in his maternal grandmother.  ROS:  Please see the history of present illness.   Otherwise, review of systems are positive for none.   All other systems are reviewed and negative.    PHYSICAL EXAM: VS:  BP 106/76 mmHg  Pulse 88  Ht 5\' 7"  (1.702 m)  Wt 122.199 kg (269 lb 6.4 oz)  BMI 42.18 kg/m2 , BMI Body mass index is 42.18 kg/(m^2). GENERAL:  Well appearing HEENT:  Pupils equal round and reactive, fundi not visualized, oral mucosa unremarkable NECK:  No JVD, waveform within normal limits, carotid upstroke brisk and symmetric, no bruits, no thyromegaly CHEST: Healing ICD incision in the L infraclavicular fossa.  Two steri-strips remain.  Incision clean, dry and in tact. LYMPHATICS:  No cervical adenopathy LUNGS:  Clear to auscultation bilaterally HEART:  RRR.  PMI not displaced or sustained,S1 and S2 within normal limits, no S3, no S4, no clicks, no rubs, no murmurs ABD:  Flat, positive bowel sounds normal in frequency in pitch, no bruits, no rebound, no guarding, no midline pulsatile mass, no hepatomegaly, no splenomegaly EXT:  2 plus pulses throughout, no edema, no cyanosis no clubbing SKIN:  No rashes no nodules NEURO:  Cranial nerves II through XII grossly intact, motor grossly intact throughout PSYCH:  Cognitively intact, oriented to person place and time    EKG:  EKG is not ordered today.   Recent Labs: 08/21/2015: ALT 15*; B Natriuretic Peptide 219.6* 08/23/2015: Hemoglobin 11.4*; Platelets 218 08/27/2015: BUN 33*; Creat 1.41*; Magnesium 1.9; Potassium 4.6;  Sodium 138    Lipid Panel    Component Value Date/Time   CHOL 159 05/26/2015 0409   TRIG 66 05/26/2015 0409   HDL 35* 05/26/2015 0409   CHOLHDL 4.5 05/26/2015 0409   VLDL 13 05/26/2015 0409   LDLCALC 111* 05/26/2015 0409      Wt Readings from Last 3 Encounters:  12/17/15 122.199 kg (269 lb 6.4 oz)  08/27/15 120.203 kg (265 lb)  08/23/15 121.065 kg (266 lb 14.4 oz)    Echo 08/22/15: Study Conclusions  - Left ventricle: The cavity size was severely dilated. Systolic function was severely reduced. The estimated ejection fraction was in the range of 15-20%. Diffuse hypokinesis. - Aortic valve: There was trivial regurgitation. - Mitral valve: There was mild regurgitation. - Right ventricle: Systolic function was normal. - Right atrium: The atrium was normal in size. - Tricuspid valve: There was trivial regurgitation.   Other studies Reviewed: Additional studies/ records that were reviewed today include: . Review of the above records demonstrates:  Please see elsewhere in the note.     ASSESSMENT AND PLAN:  # Chronic systolic heart failure: Mr. Gaillard is doing well since his last appointment.  Since I last saw him he had a single-chamber ICD implanted. He has follow-up scheduled with the [facility] next month and would like to have this monitored here after that appointment.  He is euvolemic on exam and has not heart failure symptoms at this time.  Continue carvedilol, prolactin, hydralazine,and isosorbide dinitrate.If he continues to have dizziness we will reduce his isosorbide and hydralazine.  # Hyperlipidemia: Continue simvastatin. Consider switching to high dose atorvastatin or rosuvastatin at next appointment given ASCVD 10 year risk of 30.9%.  # Obesity: Mr. Eimers was encouraged to increase his physical activity to include more walking and aerobic activity.  # ICD: No ICD firing.  He would like to have his device management her clinic once he is cleared by the  [place].  # Paroxysmal atrial fibrillation: Hematuria  on Xarelto.  No recurrent atrial fibrillation has been noted since his initial episode (post-op, 2014).  Given that this only occurred post-operatively, it is reasonable to remain off anticoagulation.  This patients CHA2DS2-VASc Score and unadjusted Ischemic Stroke Rate (% per year) is equal to 7.2 % stroke rate/year from a score of 5  Above score calculated as 1 point each if present [CHF, HTN, DM, Vascular=MI/PAD/Aortic Plaque, Age if 65-74, or Male] Above score calculated as 2 points each if present [Age > 75, or Stroke/TIA/TE]    Current medicines are reviewed at length with the patient today.  The patient does not have concerns regarding medicines.  The following changes have been made:  no change  Labs/ tests ordered today include:   No orders of the defined types were placed in this encounter.     Disposition:   FU with Joanette Silveria C. Duke Salvia, MD in 6 months.    Signed, Madilyn Hook, MD  12/17/2015 2:51 PM    [facility]

## 2016-01-22 ENCOUNTER — Emergency Department (HOSPITAL_COMMUNITY)
Admission: EM | Admit: 2016-01-22 | Discharge: 2016-01-23 | Disposition: A | Discharge status: Home / Self Care | Payer: Medicare Other | Attending: Emergency Medicine | Admitting: Emergency Medicine

## 2016-01-22 ENCOUNTER — Emergency Department (HOSPITAL_COMMUNITY): Payer: Medicare Other

## 2016-01-22 ENCOUNTER — Encounter (HOSPITAL_COMMUNITY): Payer: Self-pay | Admitting: Emergency Medicine

## 2016-01-22 DIAGNOSIS — Z87891 Personal history of nicotine dependence: Secondary | ICD-10-CM | POA: Diagnosis not present

## 2016-01-22 DIAGNOSIS — E7800 Pure hypercholesterolemia, unspecified: Secondary | ICD-10-CM | POA: Insufficient documentation

## 2016-01-22 DIAGNOSIS — Z7982 Long term (current) use of aspirin: Secondary | ICD-10-CM | POA: Diagnosis not present

## 2016-01-22 DIAGNOSIS — Z7984 Long term (current) use of oral hypoglycemic drugs: Secondary | ICD-10-CM | POA: Diagnosis not present

## 2016-01-22 DIAGNOSIS — M1990 Unspecified osteoarthritis, unspecified site: Secondary | ICD-10-CM | POA: Diagnosis not present

## 2016-01-22 DIAGNOSIS — I480 Paroxysmal atrial fibrillation: Secondary | ICD-10-CM | POA: Diagnosis not present

## 2016-01-22 DIAGNOSIS — Z79899 Other long term (current) drug therapy: Secondary | ICD-10-CM | POA: Diagnosis not present

## 2016-01-22 DIAGNOSIS — I10 Essential (primary) hypertension: Secondary | ICD-10-CM | POA: Diagnosis not present

## 2016-01-22 DIAGNOSIS — Z8619 Personal history of other infectious and parasitic diseases: Secondary | ICD-10-CM | POA: Diagnosis not present

## 2016-01-22 DIAGNOSIS — H269 Unspecified cataract: Secondary | ICD-10-CM | POA: Insufficient documentation

## 2016-01-22 DIAGNOSIS — E119 Type 2 diabetes mellitus without complications: Secondary | ICD-10-CM | POA: Insufficient documentation

## 2016-01-22 DIAGNOSIS — Z87442 Personal history of urinary calculi: Secondary | ICD-10-CM | POA: Insufficient documentation

## 2016-01-22 DIAGNOSIS — R600 Localized edema: Secondary | ICD-10-CM | POA: Diagnosis not present

## 2016-01-22 DIAGNOSIS — Z9889 Other specified postprocedural states: Secondary | ICD-10-CM | POA: Insufficient documentation

## 2016-01-22 DIAGNOSIS — R0602 Shortness of breath: Secondary | ICD-10-CM | POA: Diagnosis present

## 2016-01-22 DIAGNOSIS — I509 Heart failure, unspecified: Secondary | ICD-10-CM | POA: Insufficient documentation

## 2016-01-22 NOTE — ED Notes (Signed)
Pt brought to ED by [facility] from home for SOB, pt has a hx  CHF, SOB increasing for 2 hours getting worse when he is lying down. Pt denies any CP, nausea or vomiting at this time. VSS BP 120/86, HR 95, R 25, pt is 99% on 2L [place].

## 2016-01-22 NOTE — ED Provider Notes (Signed)
CSN: [id number]     Arrival date & time 01/22/16  2243 History  By signing my name below, I, Iona Beard, attest that this documentation has been prepared under the direction and in the presence of Gwyneth Sprout, MD.   Electronically Signed: Iona Beard, ED Scribe. 01/23/2016. 1:22 AM     Chief Complaint  Patient presents with  . Shortness of Breath     The history is provided by the patient. No language interpreter was used.   HPI Comments: Ryan Macdonald is a 82 y.o. male who presents to the Emergency Department complaining of gradual onset, constant, difficulty breathing, onset earlier tonight. Pt states that he has had a non-productive cough and cold-like symptoms ongoing for three days, but felt especially uncomfortable tonight, prompting him to come to the ED. No worsening or alleviating factors noted. Pt denies rhinorrhea, fever, generalized swelling, weight fluctuations, chest tightness, chest pain, back pain, or palpitations. Pt's wife states that he gets worried about his health due to his defibrillator and he reports he has occasional bouts with A-fib.    Past Medical History  Diagnosis Date  . Nonischemic cardiomyopathy (HCC)     a. ? 2009 Cath in [place] - nl cors per pt;  b. 09/2013 Echo: EF 25-30%, sev diff HK.  Marland Kitchen Hypertension   . Cat scratch fever     removed mass on right side neck  . Cataracts, bilateral   . High cholesterol   . Osteoarthritis   . Kidney stones   . Morbid obesity (HCC)   . PAF (paroxysmal atrial fibrillation) (HCC)     a. post-op hip in 2014 - prev on xarelto.  . CHF (congestive heart failure) (HCC)   . Shortness of breath dyspnea   . Diabetes mellitus without complication (HCC)     TYPE 2   Past Surgical History  Procedure Laterality Date  . Kidney stone surgery Right 1970's; 1983    "twice"  . Tonsillectomy and adenoidectomy  1940's  . Cystoscopy with urethral dilatation  10/23/2013  . Total hip arthroplasty Left 10/23/2013  .  Inguinal hernia repair Right 1950's  . Circumcision  1940's  . Incision and drainage of wound Right 1980's    "cat scratch" (10/23/2013)  . Cardiac catheterization  1980's  . Total hip arthroplasty Left 10/23/2013    Procedure: TOTAL HIP ARTHROPLASTY;  Surgeon: Valeria Batman, MD;  Location: [facility] OR;  Service: Orthopedics;  Laterality: Left;  . Cystoscopy with urethral dilatation N/A 10/23/2013    Procedure: CYSTOSCOPY WITH URETHRAL DILATATION;  Surgeon: Kathi Ludwig, MD;  Location: [facility] OR;  Service: Urology;  Laterality: N/A;   Family History  Problem Relation Age of Onset  . Other      negative for premature CAD  . Heart Problems Maternal Grandmother    Social History  Substance Use Topics  . Smoking status: Former Smoker -- 1.00 packs/day for 15 years    Types: Cigarettes, Cigars  . Smokeless tobacco: Former Neurosurgeon     Comment: quit smoking ~ 40 yr ago  . Alcohol Use: No     Comment: rare beer.    Review of Systems A complete 10 system review of systems was obtained and all systems are negative except as noted in the HPI and PMH.     Allergies  Lisinopril  Home Medications   Prior to Admission medications   Medication Sig Start Date End Date Taking? Authorizing Provider  allopurinol (ZYLOPRIM) 100 MG tablet Take 200 mg  by mouth daily.    Historical Provider, MD  aspirin EC 81 MG tablet Take 81 mg by mouth daily.    Historical Provider, MD  carvedilol (COREG) 12.5 MG tablet Take 12.5 mg by mouth 2 (two) times daily with a meal.    Historical Provider, MD  cyanocobalamin 1000 MCG tablet Take 2,000 mcg by mouth daily.    Historical Provider, MD  docusate sodium (COLACE) 100 MG capsule Take 100 mg by mouth 2 (two) times daily. For constipation    Historical Provider, MD  furosemide (LASIX) 40 MG tablet Take 1 tablet (40 mg total) by mouth daily. 05/27/15   Clydia Llano, MD  glipiZIDE (GLUCOTROL) 5 MG tablet Take 5 mg by mouth daily before breakfast.    Historical  Provider, MD  hydrALAZINE (APRESOLINE) 10 MG tablet Take 20 mg by mouth 2 (two) times daily.    Historical Provider, MD  HYDROcodone-acetaminophen (NORCO/VICODIN) 5-325 MG per tablet Take 1 tablet by mouth every 8 (eight) hours as needed for moderate pain (do not exceed 4 gm of tylenol in 24 ours). 06/06/15   Kirt Boys, DO  isosorbide dinitrate (DILATRATE-SR) 40 MG CR capsule Take 1 capsule (40 mg total) by mouth every 12 (twelve) hours. 08/27/15   [name], MD  lactobacillus acidophilus (BACID) TABS tablet Take 1 tablet by mouth 2 (two) times daily.    Historical Provider, MD  niacin 500 MG tablet Take 500 mg by mouth at bedtime as needed. For pain    Historical Provider, MD  polyethylene glycol (MIRALAX / GLYCOLAX) packet Take 17 g by mouth daily. For constipation    Historical Provider, MD  simvastatin (ZOCOR) 40 MG tablet Take 40 mg by mouth daily. For high cholesterol    Historical Provider, MD  spironolactone (ALDACTONE) 25 MG tablet Take 0.5 tablets (12.5 mg total) by mouth daily. 05/27/15   Clydia Llano, MD  tamsulosin (FLOMAX) 0.4 MG CAPS capsule Take 0.4 mg by mouth at bedtime.    Historical Provider, MD  traMADol (ULTRAM) 50 MG tablet Take 50 mg by mouth 3 (three) times daily as needed for moderate pain.     Historical Provider, MD   BP 108/91 mmHg  Pulse 87  Temp(Src) 97.7 F (36.5 C) (Oral)  Resp 23  Ht  (1.702 m)  Wt 260 lb (117.935 kg)  BMI 40.71 kg/m2  SpO2 97% Physical Exam  Constitutional: He is oriented to person, place, and time. He appears well-developed and well-nourished.  HENT:  Head: Normocephalic and atraumatic.  Eyes: EOM are normal.  Neck: Normal range of motion.  Cardiovascular: Normal rate, regular rhythm, normal heart sounds and intact distal pulses.   Pulmonary/Chest: Effort normal and breath sounds normal. No respiratory distress. He has no wheezes.  Abdominal: Soft. Bowel sounds are normal. He exhibits no distension. There is no tenderness.  There is no guarding.  Musculoskeletal: Normal range of motion. He exhibits edema.  Trace edema in bilateral lower extremities.   Neurological: He is alert and oriented to person, place, and time.  Skin: Skin is warm and dry.  Psychiatric: He has a normal mood and affect. Judgment normal.  Nursing note and vitals reviewed.   ED Course  Procedures (including critical care time) DIAGNOSTIC STUDIES: Oxygen Saturation is 97% on RA, normal by my interpretation.    COORDINATION OF CARE: 11:17 PM-Discussed treatment plan which includes EKG, CXR, CBC with differential, CMP, and troponin with pt at bedside and pt agreed to plan.    Labs  Review Labs Reviewed  CBC WITH DIFFERENTIAL/PLATELET  COMPREHENSIVE METABOLIC PANEL  BRAIN NATRIURETIC PEPTIDE  I-STAT TROPOININ, ED    Imaging Review Dg Chest 2 View  01/22/2016  CLINICAL DATA:  Shortness of breath. Symptoms for 2 hours, worse when lying down. EXAM: CHEST  2 VIEW COMPARISON:  08/23/2015 FINDINGS: Single lead left-sided pacemaker, tip projecting over the right ventricle. Mild cardiomegaly and tortuous thoracic aorta are unchanged. Minimal atelectasis at the left lung base. There is no pulmonary edema, consolidation, pleural effusion or pneumothorax. Degenerative change in the spine. IMPRESSION: Stable mild cardiomegaly. No evidence of congestive failure or acute process Electronically Signed   By: Rubye Oaks M.D.   On: 01/22/2016 23:45   I have personally reviewed and evaluated these images and lab results as part of my medical decision-making.   EKG Interpretation   Date/Time:  Thursday January 22 2016 22:53:03 EST Ventricular Rate:  85 PR Interval:    QRS Duration: 98 QT Interval:  383 QTC Calculation: 455 R Axis:   35 Text Interpretation:  recurrent Atrial fibrillation Nonspecific T  abnormalities, lateral leads Baseline wander in lead(s) II III aVF  Confirmed by Anitra Lauth  MD, Alphonzo Lemmings ([id number]) on 01/22/2016 10:58:18 PM       MDM   Final diagnoses:  Paroxysmal atrial fibrillation [facility])   Patient is a 82 year old male with a history of nonischemic cardiomyopathy status post a fibular knee replacement, paroxysmal atrial fibrillation, CHF, diabetes who presents today for 2 hours of feeling uncomfortable at home. He had mild shortness of breath during that time. He states now he is starting to feel better. He denies any history of chest tightness, discomfort or pain during this episode. He has noted a dry cough for the last 3-4 days which he attributes to getting a cold that his wife had last week. He has been taking plain Mucinex for this. He denies fever, sputum, rhinorrhea. He takes his medications daily and has not missed any doses. He denies any evidence of new edema or weight gain.  On exam patient is well-appearing without acute findings at this time. Initial EKG showed atrial fibrillation without other changes. Currently when I am in the room his pulse is regular in feels more like he went back into sinus rhythm however repeat EKG confirms that patient is still in atrial fibrillation. This may be the cause of his discomfort. He denied any palpitations or irregular feelings of his heart during his episode.  Patient only takes an 81 mg aspirin at this time and is not on no other anticoagulation. He denies any strokelike symptoms.  Possibility that patient's event tonight was ACS versus CHF versus atrial fibrillation. Lower suspicion for pneumonia.  Pt has not been in a.fib since 2014 and was on xarelto but was d/ced for hematuria and since he had no further episodes initially cardiology did not think he needed anticoagulation.  CBC, CMP, BMP, troponin, chest x-ray pending. Will repeat EKG.  7:16 AM CBC, CMP without acute findings.  BNP improved.  CXR wnl and trop neg.  patient cannot take blood thinners because of severe hematuria requiring hospitalization. He is not interested in being on blood thinners despite being  in atrial fibrillation. Spoke with Dr. Shirlee Latch who recommended that patient just continue his 81 mg aspirin at this time. Patient and his wife are comfortable with discharge home and they will call her cardiologist for follow-up. He also has an appointment to be seen at the [place] on Tuesday. I personally performed the  services described in this documentation, which was scribed in my presence.  The recorded information has been reviewed and considered.      Gwyneth Sprout, MD 01/23/16 [phone]

## 2016-01-23 ENCOUNTER — Ambulatory Visit: Payer: Medicare Other | Admitting: Podiatry

## 2016-01-23 LAB — CBC WITH DIFFERENTIAL/PLATELET
BASOS ABS: 0 10*3/uL (ref 0.0–0.1)
Basophils Relative: 0 %
EOS ABS: 0.1 10*3/uL (ref 0.0–0.7)
EOS PCT: 1 %
HCT: 37.1 % — ABNORMAL LOW (ref 39.0–52.0)
Hemoglobin: 12.4 g/dL — ABNORMAL LOW (ref 13.0–17.0)
LYMPHS PCT: 24 %
Lymphs Abs: 1.9 10*3/uL (ref 0.7–4.0)
MCH: 31.3 pg (ref 26.0–34.0)
MCHC: 33.4 g/dL (ref 30.0–36.0)
MCV: 93.7 fL (ref 78.0–100.0)
MONO ABS: 0.4 10*3/uL (ref 0.1–1.0)
Monocytes Relative: 6 %
Neutro Abs: 5.3 10*3/uL (ref 1.7–7.7)
Neutrophils Relative %: 69 %
PLATELETS: 238 10*3/uL (ref 150–400)
RBC: 3.96 MIL/uL — ABNORMAL LOW (ref 4.22–5.81)
RDW: 13.6 % (ref 11.5–15.5)
WBC: 7.7 10*3/uL (ref 4.0–10.5)

## 2016-01-23 LAB — BRAIN NATRIURETIC PEPTIDE: B Natriuretic Peptide: 119.2 pg/mL — ABNORMAL HIGH (ref 0.0–100.0)

## 2016-01-23 LAB — I-STAT TROPONIN, ED: Troponin i, poc: 0.03 ng/mL (ref 0.00–0.08)

## 2016-01-23 LAB — COMPREHENSIVE METABOLIC PANEL
ALT: 14 U/L — ABNORMAL LOW (ref 17–63)
AST: 26 U/L (ref 15–41)
Albumin: 3.3 g/dL — ABNORMAL LOW (ref 3.5–5.0)
Alkaline Phosphatase: 51 U/L (ref 38–126)
Anion gap: 13 (ref 5–15)
BUN: 23 mg/dL — ABNORMAL HIGH (ref 6–20)
CHLORIDE: 102 mmol/L (ref 101–111)
CO2: 24 mmol/L (ref 22–32)
Calcium: 9.3 mg/dL (ref 8.9–10.3)
Creatinine, Ser: 1.36 mg/dL — ABNORMAL HIGH (ref 0.61–1.24)
GFR, EST AFRICAN AMERICAN: 55 mL/min — AB (ref >60)
GFR, EST NON AFRICAN AMERICAN: 48 mL/min — AB (ref >60)
GLUCOSE: 144 mg/dL — AB (ref 65–99)
POTASSIUM: 4.4 mmol/L (ref 3.5–5.1)
SODIUM: 139 mmol/L (ref 135–145)
TOTAL PROTEIN: 7.2 g/dL (ref 6.5–8.1)
Total Bilirubin: 0.4 mg/dL (ref 0.3–1.2)

## 2016-01-23 NOTE — Discharge Instructions (Signed)
Atrial Fibrillation °Atrial fibrillation is a type of heartbeat that is irregular or fast (rapid). If you have this condition, your heart keeps quivering in a weird (chaotic) way. This condition can make it so your heart cannot pump blood normally. Having this condition gives a person more risk for stroke, heart failure, and other heart problems. There are different types of atrial fibrillation. Talk with your doctor to learn about the type that you have. °HOME CARE °· Take over-the-counter and prescription medicines only as told by your doctor. °· If your doctor prescribed a blood-thinning medicine, take it exactly as told. Taking too much of it can cause bleeding. If you do not take enough of it, you will not have the protection that you need against stroke and other problems. °· Do not use any tobacco products. These include cigarettes, chewing tobacco, and e-cigarettes. If you need help quitting, ask your doctor. °· If you have apnea (obstructive sleep apnea), manage it as told by your doctor. °· Do not drink alcohol. °· Do not drink beverages that have caffeine. These include coffee, soda, and tea. °· Maintain a healthy weight. Do not use diet pills unless your doctor says they are safe for you. Diet pills may make heart problems worse. °· Follow diet instructions as told by your doctor. °· Exercise regularly as told by your doctor. °· Keep all follow-up visits as told by your doctor. This is important. °GET HELP IF: °· You notice a change in the speed, rhythm, or strength of your heartbeat. °· You are taking a blood-thinning medicine and you notice more bruising. °· You get tired more easily when you move or exercise. °GET HELP RIGHT AWAY IF: °· You have pain in your chest or your belly (abdomen). °· You have sweating or weakness. °· You feel sick to your stomach (nauseous). °· You notice blood in your throw up (vomit), poop (stool), or pee (urine). °· You are short of breath. °· You suddenly have swollen feet  and ankles. °· You feel dizzy. °· Your suddenly get weak or numb in your face, arms, or legs, especially if it happens on one side of your body. °· You have trouble talking, trouble understanding, or both. °· Your face or your eyelid droops on one side. °These symptoms may be an emergency. Do not wait to see if the symptoms will go away. Get medical help right away. Call your local emergency services (911 in the [place] Do not drive yourself to the hospital. °  °This information is not intended to replace advice given to you by your health care provider. Make sure you discuss any questions you have with your health care provider. °  °Document Released: 08/24/2008 Document Revised: 08/06/2015 Document Reviewed: 03/12/2015 °Elsevier Interactive Patient Education ©2016 [employer]. ° °

## 2016-04-09 ENCOUNTER — Encounter (HOSPITAL_COMMUNITY): Payer: Self-pay

## 2016-04-09 ENCOUNTER — Emergency Department (HOSPITAL_COMMUNITY)
Admission: EM | Admit: 2016-04-09 | Discharge: 2016-04-10 | Disposition: A | Discharge status: Home / Self Care | Payer: Non-veteran care | Attending: Emergency Medicine | Admitting: Emergency Medicine

## 2016-04-09 ENCOUNTER — Other Ambulatory Visit: Payer: Self-pay

## 2016-04-09 ENCOUNTER — Emergency Department (HOSPITAL_COMMUNITY): Payer: Non-veteran care

## 2016-04-09 DIAGNOSIS — I10 Essential (primary) hypertension: Secondary | ICD-10-CM | POA: Diagnosis not present

## 2016-04-09 DIAGNOSIS — I480 Paroxysmal atrial fibrillation: Secondary | ICD-10-CM | POA: Diagnosis not present

## 2016-04-09 DIAGNOSIS — Z79899 Other long term (current) drug therapy: Secondary | ICD-10-CM | POA: Insufficient documentation

## 2016-04-09 DIAGNOSIS — Z87891 Personal history of nicotine dependence: Secondary | ICD-10-CM | POA: Insufficient documentation

## 2016-04-09 DIAGNOSIS — Z87442 Personal history of urinary calculi: Secondary | ICD-10-CM | POA: Diagnosis not present

## 2016-04-09 DIAGNOSIS — Z9889 Other specified postprocedural states: Secondary | ICD-10-CM | POA: Diagnosis not present

## 2016-04-09 DIAGNOSIS — E119 Type 2 diabetes mellitus without complications: Secondary | ICD-10-CM | POA: Insufficient documentation

## 2016-04-09 DIAGNOSIS — M1990 Unspecified osteoarthritis, unspecified site: Secondary | ICD-10-CM | POA: Insufficient documentation

## 2016-04-09 DIAGNOSIS — Z8619 Personal history of other infectious and parasitic diseases: Secondary | ICD-10-CM | POA: Diagnosis not present

## 2016-04-09 DIAGNOSIS — M25531 Pain in right wrist: Secondary | ICD-10-CM | POA: Diagnosis present

## 2016-04-09 DIAGNOSIS — E7800 Pure hypercholesterolemia, unspecified: Secondary | ICD-10-CM | POA: Diagnosis not present

## 2016-04-09 DIAGNOSIS — R05 Cough: Secondary | ICD-10-CM | POA: Insufficient documentation

## 2016-04-09 DIAGNOSIS — M10031 Idiopathic gout, right wrist: Secondary | ICD-10-CM | POA: Insufficient documentation

## 2016-04-09 DIAGNOSIS — Z7982 Long term (current) use of aspirin: Secondary | ICD-10-CM | POA: Diagnosis not present

## 2016-04-09 DIAGNOSIS — M109 Gout, unspecified: Secondary | ICD-10-CM

## 2016-04-09 DIAGNOSIS — I509 Heart failure, unspecified: Secondary | ICD-10-CM | POA: Insufficient documentation

## 2016-04-09 MED ORDER — PREDNISONE 10 MG PO TABS: 10.0000 mg | ORAL_TABLET | Freq: Every day | ORAL | Status: DC

## 2016-04-09 MED ORDER — COLCHICINE 0.6 MG PO TABS
1.2000 mg | ORAL_TABLET | Freq: Once | ORAL | Status: AC
  Administered 2016-04-09: 1.2 mg via ORAL
  Filled 2016-04-09: qty 2

## 2016-04-09 MED ORDER — LIDOCAINE HCL (PF) 1 % IJ SOLN
5.0000 mL | Freq: Once | INTRAMUSCULAR | Status: AC
  Administered 2016-04-09: 5 mL via INTRADERMAL
  Filled 2016-04-09: qty 5

## 2016-04-09 MED ORDER — COLCHICINE 0.6 MG PO TABS: 0.6000 mg | ORAL_TABLET | Freq: Every day | ORAL | Status: DC

## 2016-04-09 MED ORDER — MORPHINE SULFATE (PF) 4 MG/ML IV SOLN
4.0000 mg | Freq: Once | INTRAVENOUS | Status: AC
  Administered 2016-04-09: 4 mg via INTRAMUSCULAR
  Filled 2016-04-09: qty 1

## 2016-04-09 MED ORDER — HYDROCODONE-ACETAMINOPHEN 5-325 MG PO TABS: 2.0000 | ORAL_TABLET | ORAL | Status: DC | PRN

## 2016-04-09 MED ORDER — PREDNISONE 20 MG PO TABS
50.0000 mg | ORAL_TABLET | Freq: Once | ORAL | Status: AC
  Administered 2016-04-09: 50 mg via ORAL
  Filled 2016-04-09: qty 3

## 2016-04-09 MED ORDER — COLCHICINE 0.6 MG PO TABS
0.6000 mg | ORAL_TABLET | Freq: Once | ORAL | Status: AC
  Administered 2016-04-09: 0.6 mg via ORAL
  Filled 2016-04-09: qty 1

## 2016-04-09 NOTE — Discharge Instructions (Signed)
Return to the emergency department for fevers, chills, worsening pain, swelling or spreading redness up your right arm.

## 2016-04-09 NOTE — ED Notes (Signed)
Pt complaining of R hand swelling. States he also has productive cough, white sputum. Pt hx: gout.

## 2016-04-09 NOTE — ED Notes (Signed)
Pt denies any SOB or chest pain.  

## 2016-04-09 NOTE — ED Notes (Signed)
Pt unable to remove his ring

## 2016-04-09 NOTE — ED Provider Notes (Signed)
CSN: [id number]     Arrival date & time 04/09/16  1821 History   First MD Initiated Contact with Patient 04/09/16 2143     Chief Complaint  Patient presents with  . Hand Injury  . Cough   The history is provided by the patient, the spouse and medical records. No language interpreter was used.   Patient is a 82 year old male with past medical history of heart failure, diabetes, gout who presents with right wrist redness swelling and pain. Patient reports he normally gets his gout has bilateral great toes but has not had it in his wrist previously. Pain is 10 out of 10 at worst and is exacerbated by movement of the wrist or palpation of the wrist. He denies fevers or chills. Denies recent falls or injury to the wrist. Reports he took Norco at home without any relief. He has not tried taking any NSAIDs.   Past Medical History  Diagnosis Date  . Nonischemic cardiomyopathy (HCC)     a. ? 2009 Cath in [place] - nl cors per pt;  b. 09/2013 Echo: EF 25-30%, sev diff HK.  Marland Kitchen Hypertension   . Cat scratch fever     removed mass on right side neck  . Cataracts, bilateral   . High cholesterol   . Osteoarthritis   . Kidney stones   . Morbid obesity (HCC)   . PAF (paroxysmal atrial fibrillation) (HCC)     a. post-op hip in 2014 - prev on xarelto.  . CHF (congestive heart failure) (HCC)   . Shortness of breath dyspnea   . Diabetes mellitus without complication (HCC)     TYPE 2   Past Surgical History  Procedure Laterality Date  . Kidney stone surgery Right 1970's; 1983    "twice"  . Tonsillectomy and adenoidectomy  1940's  . Cystoscopy with urethral dilatation  10/23/2013  . Total hip arthroplasty Left 10/23/2013  . Inguinal hernia repair Right 1950's  . Circumcision  1940's  . Incision and drainage of wound Right 1980's    "cat scratch" (10/23/2013)  . Cardiac catheterization  1980's  . Total hip arthroplasty Left 10/23/2013    Procedure: TOTAL HIP ARTHROPLASTY;  Surgeon: Valeria Batman, MD;   Location: [facility] OR;  Service: Orthopedics;  Laterality: Left;  . Cystoscopy with urethral dilatation N/A 10/23/2013    Procedure: CYSTOSCOPY WITH URETHRAL DILATATION;  Surgeon: Kathi Ludwig, MD;  Location: [facility] OR;  Service: Urology;  Laterality: N/A;   Family History  Problem Relation Age of Onset  . Other      negative for premature CAD  . Heart Problems Maternal Grandmother    Social History  Substance Use Topics  . Smoking status: Former Smoker -- 1.00 packs/day for 15 years    Types: Cigarettes, Cigars  . Smokeless tobacco: Former Neurosurgeon     Comment: quit smoking ~ 40 yr ago  . Alcohol Use: No     Comment: rare beer.    Review of Systems  Constitutional: Negative for fever and chills.  HENT: Negative for congestion and rhinorrhea.   Respiratory: Negative for cough and shortness of breath.   Cardiovascular: Negative for chest pain and palpitations.  Gastrointestinal: Negative for nausea, vomiting, abdominal pain and diarrhea.  Genitourinary: Negative for dysuria and difficulty urinating.  Musculoskeletal: Positive for joint swelling and arthralgias. Negative for back pain and neck pain.  Neurological: Negative for dizziness and headaches.  All other systems reviewed and are negative.     Allergies  Lisinopril  Home Medications   Prior to Admission medications   Medication Sig Start Date End Date Taking? Authorizing Provider  allopurinol (ZYLOPRIM) 100 MG tablet Take 200 mg by mouth daily.    Historical Provider, MD  aspirin EC 81 MG tablet Take 81 mg by mouth daily.    Historical Provider, MD  carvedilol (COREG) 12.5 MG tablet Take 12.5 mg by mouth 2 (two) times daily with a meal.    Historical Provider, MD  cyanocobalamin 1000 MCG tablet Take 1,000 mcg by mouth daily.     Historical Provider, MD  furosemide (LASIX) 40 MG tablet Take 1 tablet (40 mg total) by mouth daily. Patient not taking: Reported on 01/23/2016 05/27/15   Clydia Llano, MD  hydrALAZINE  (APRESOLINE) 25 MG tablet Take 25 mg by mouth 3 (three) times daily.    Historical Provider, MD  HYDROcodone-acetaminophen (NORCO/VICODIN) 5-325 MG per tablet Take 1 tablet by mouth every 8 (eight) hours as needed for moderate pain (do not exceed 4 gm of tylenol in 24 ours). Patient not taking: Reported on 01/23/2016 06/06/15   Kirt Boys, DO  isosorbide dinitrate (DILATRATE-SR) 40 MG CR capsule Take 1 capsule (40 mg total) by mouth every 12 (twelve) hours. 08/27/15   [name], MD  simvastatin (ZOCOR) 80 MG tablet Take 40 mg by mouth daily.    Historical Provider, MD  spironolactone (ALDACTONE) 25 MG tablet Take 0.5 tablets (12.5 mg total) by mouth daily. 05/27/15   Clydia Llano, MD  tamsulosin (FLOMAX) 0.4 MG CAPS capsule Take 0.4 mg by mouth at bedtime.    Historical Provider, MD   BP 117/80 mmHg  Pulse 72  Temp(Src) 98.3 F (36.8 C) (Oral)  Resp 14  SpO2 99% Physical Exam  Constitutional: He is oriented to person, place, and time. He appears well-developed and well-nourished.  HENT:  Head: Normocephalic and atraumatic.  Eyes: EOM are normal. Pupils are equal, round, and reactive to light.  Neck: Normal range of motion. Neck supple.  Cardiovascular: Normal rate, regular rhythm and intact distal pulses.   Pulmonary/Chest: Effort normal and breath sounds normal. No respiratory distress.  Abdominal: Soft. He exhibits no distension. There is no tenderness.  Musculoskeletal: Normal range of motion. He exhibits no edema or tenderness.  Right wrist mildly swollen, diffusely TTP and erythematous over dorsal surface. ROM of R wrist limited 2/2 pain.   Neurological: He is alert and oriented to person, place, and time.  Skin: Skin is warm and dry.  Psychiatric: He has a normal mood and affect.  Nursing note and vitals reviewed.   ED Course  .Joint Aspiration/Arthrocentesis Date/Time: 04/10/2016 1:50 AM Performed by: Katrinka Blazing, Konstance Happel B Authorized by: Lake Bells B Consent: Verbal consent  obtained. Written consent obtained. Risks and benefits: risks, benefits and alternatives were discussed Consent given by: patient Required items: required blood products, implants, devices, and special equipment available Patient identity confirmed: verbally with patient Time out: Immediately prior to procedure a "time out" was called to verify the correct patient, procedure, equipment, support staff and site/side marked as required. Indications: joint swelling,  pain and diagnostic evaluation  Body area: wrist Joint: right wrist Local anesthesia used: yes Local anesthetic: lidocaine 1% with epinephrine Patient sedated: no Preparation: Patient was prepped and draped in the usual sterile fashion. Needle gauge: 22 G Ultrasound guidance: no Patient tolerance: Patient tolerated the procedure well with no immediate complications Comments: Unable to obtain any fluid sample.    (including critical care time) Labs Review Labs Reviewed -  No data to display  Imaging Review Dg Wrist Complete Right  04/09/2016  CLINICAL DATA:  82 year old male with pain and swelling in the right wrist for the past 3 days. No known injury. Possible gout. EXAM: RIGHT WRIST - COMPLETE 3+ VIEW COMPARISON:  No priors. FINDINGS: Four views of the right wrist demonstrate no acute displaced fracture, subluxation or dislocation. Old ulnar styloid avulsion fracture incidentally noted. No definite osseous erosions or unusual soft tissue calcifications are identified. Mild degenerative changes of osteoarthritis. IMPRESSION: 1. No acute abnormality of the right wrist. 2. Old ulnar styloid avulsion fracture. Electronically Signed   By: Trudie Reed M.D.   On: 04/09/2016 19:24   I have personally reviewed and evaluated these images and lab results as part of my medical decision-making.   EKG Interpretation None      MDM   Final diagnoses:  Acute gout of right wrist, unspecified cause    82 year old male presents with  right wrist pain swelling and erythema concerning for acute gout flare. Patient is afebrile, other infectious signs or symptoms. On exam his right wrist is tender mildly swollen and erythematous. He is unable to perform full range of motion secondary to pain. Imaging of the right wrist shows no signs of injury, effusion or osteomyelitis. Does have some mild arthritic changes. No other infectious s/s. Doubt septic joint. Likely gout flare. Treated with prednisone and colchicine as patient has chronic kidney disease. As the patient has not had gout flares in his wrist prior prior to this, he was consented and right wrist arthrocentesis is performed. See procedure note above. Unfortunately was a dry tap so new samples were obtained. Patient was discharged with prescriptions for colchicine, prednisone taper, and small amount of Norco. Advised to follow-up with his primary doctor Monday for reevaluation. Strict return precautions discussed. Patient and wife in agreement with plan.  Discussed with Dr. Adela Lank.     Isa Rankin, MD 04/10/16 0153  Melene Plan, DO 04/10/16 [phone]

## 2016-05-19 ENCOUNTER — Inpatient Hospital Stay (HOSPITAL_COMMUNITY)
Admission: EM | Admit: 2016-05-19 | Discharge: 2016-05-21 | DRG: 291 | Disposition: A | Discharge status: Home / Self Care | Payer: Medicare Other | Attending: Internal Medicine | Admitting: Internal Medicine

## 2016-05-19 ENCOUNTER — Emergency Department (HOSPITAL_COMMUNITY): Payer: Medicare Other

## 2016-05-19 ENCOUNTER — Other Ambulatory Visit: Payer: Self-pay

## 2016-05-19 DIAGNOSIS — Z6841 Body Mass Index (BMI) 40.0 and over, adult: Secondary | ICD-10-CM

## 2016-05-19 DIAGNOSIS — I5023 Acute on chronic systolic (congestive) heart failure: Secondary | ICD-10-CM | POA: Diagnosis present

## 2016-05-19 DIAGNOSIS — J9621 Acute and chronic respiratory failure with hypoxia: Secondary | ICD-10-CM | POA: Diagnosis present

## 2016-05-19 DIAGNOSIS — F32A Depression, unspecified: Secondary | ICD-10-CM

## 2016-05-19 DIAGNOSIS — Z96642 Presence of left artificial hip joint: Secondary | ICD-10-CM | POA: Diagnosis present

## 2016-05-19 DIAGNOSIS — Z7984 Long term (current) use of oral hypoglycemic drugs: Secondary | ICD-10-CM

## 2016-05-19 DIAGNOSIS — I509 Heart failure, unspecified: Secondary | ICD-10-CM

## 2016-05-19 DIAGNOSIS — M109 Gout, unspecified: Secondary | ICD-10-CM | POA: Diagnosis present

## 2016-05-19 DIAGNOSIS — E1122 Type 2 diabetes mellitus with diabetic chronic kidney disease: Secondary | ICD-10-CM | POA: Diagnosis present

## 2016-05-19 DIAGNOSIS — N183 Chronic kidney disease, stage 3 unspecified: Secondary | ICD-10-CM | POA: Diagnosis present

## 2016-05-19 DIAGNOSIS — R9431 Abnormal electrocardiogram [ECG] [EKG]: Secondary | ICD-10-CM

## 2016-05-19 DIAGNOSIS — E785 Hyperlipidemia, unspecified: Secondary | ICD-10-CM

## 2016-05-19 DIAGNOSIS — R0603 Acute respiratory distress: Secondary | ICD-10-CM

## 2016-05-19 DIAGNOSIS — I5043 Acute on chronic combined systolic (congestive) and diastolic (congestive) heart failure: Secondary | ICD-10-CM

## 2016-05-19 DIAGNOSIS — E7800 Pure hypercholesterolemia, unspecified: Secondary | ICD-10-CM | POA: Diagnosis present

## 2016-05-19 DIAGNOSIS — I130 Hypertensive heart and chronic kidney disease with heart failure and stage 1 through stage 4 chronic kidney disease, or unspecified chronic kidney disease: Principal | ICD-10-CM | POA: Diagnosis present

## 2016-05-19 DIAGNOSIS — I248 Other forms of acute ischemic heart disease: Secondary | ICD-10-CM | POA: Diagnosis present

## 2016-05-19 DIAGNOSIS — F329 Major depressive disorder, single episode, unspecified: Secondary | ICD-10-CM | POA: Diagnosis present

## 2016-05-19 DIAGNOSIS — I428 Other cardiomyopathies: Secondary | ICD-10-CM | POA: Diagnosis present

## 2016-05-19 DIAGNOSIS — R0600 Dyspnea, unspecified: Secondary | ICD-10-CM | POA: Diagnosis not present

## 2016-05-19 DIAGNOSIS — Z95810 Presence of automatic (implantable) cardiac defibrillator: Secondary | ICD-10-CM

## 2016-05-19 DIAGNOSIS — Z7982 Long term (current) use of aspirin: Secondary | ICD-10-CM

## 2016-05-19 DIAGNOSIS — Z86711 Personal history of pulmonary embolism: Secondary | ICD-10-CM

## 2016-05-19 DIAGNOSIS — E1159 Type 2 diabetes mellitus with other circulatory complications: Secondary | ICD-10-CM | POA: Diagnosis present

## 2016-05-19 DIAGNOSIS — I480 Paroxysmal atrial fibrillation: Secondary | ICD-10-CM | POA: Diagnosis present

## 2016-05-19 DIAGNOSIS — Z87891 Personal history of nicotine dependence: Secondary | ICD-10-CM

## 2016-05-19 DIAGNOSIS — I4581 Long QT syndrome: Secondary | ICD-10-CM | POA: Diagnosis present

## 2016-05-19 DIAGNOSIS — Z888 Allergy status to other drugs, medicaments and biological substances status: Secondary | ICD-10-CM

## 2016-05-19 LAB — CBC WITH DIFFERENTIAL/PLATELET
BASOS ABS: 0 10*3/uL (ref 0.0–0.1)
Basophils Relative: 0 %
EOS ABS: 0.4 10*3/uL (ref 0.0–0.7)
EOS PCT: 2 %
HEMATOCRIT: 41.3 % (ref 39.0–52.0)
Hemoglobin: 13.3 g/dL (ref 13.0–17.0)
Lymphocytes Relative: 33 %
Lymphs Abs: 4.9 10*3/uL — ABNORMAL HIGH (ref 0.7–4.0)
MCH: 31.9 pg (ref 26.0–34.0)
MCHC: 32.2 g/dL (ref 30.0–36.0)
MCV: 99 fL (ref 78.0–100.0)
Monocytes Absolute: 0.8 10*3/uL (ref 0.1–1.0)
Monocytes Relative: 5 %
Neutro Abs: 8.9 10*3/uL — ABNORMAL HIGH (ref 1.7–7.7)
Neutrophils Relative %: 60 %
Platelets: 214 10*3/uL (ref 150–400)
RBC: 4.17 MIL/uL — AB (ref 4.22–5.81)
RDW: 15.7 % — AB (ref 11.5–15.5)
WBC: 15.3 10*3/uL — AB (ref 4.0–10.5)

## 2016-05-19 LAB — BRAIN NATRIURETIC PEPTIDE: B Natriuretic Peptide: 243.9 pg/mL — ABNORMAL HIGH (ref 0.0–100.0)

## 2016-05-19 LAB — COMPREHENSIVE METABOLIC PANEL WITH GFR
ALT: 54 U/L (ref 17–63)
AST: 53 U/L — ABNORMAL HIGH (ref 15–41)
Albumin: 3.5 g/dL (ref 3.5–5.0)
Alkaline Phosphatase: 52 U/L (ref 38–126)
Anion gap: 9 (ref 5–15)
BUN: 18 mg/dL (ref 6–20)
CO2: 25 mmol/L (ref 22–32)
Calcium: 9.3 mg/dL (ref 8.9–10.3)
Chloride: 106 mmol/L (ref 101–111)
Creatinine, Ser: 1.21 mg/dL (ref 0.61–1.24)
GFR calc Af Amer: >60 mL/min
GFR calc non Af Amer: 55 mL/min — ABNORMAL LOW
Glucose, Bld: 158 mg/dL — ABNORMAL HIGH (ref 65–99)
Potassium: 4.4 mmol/L (ref 3.5–5.1)
Sodium: 140 mmol/L (ref 135–145)
Total Bilirubin: 1.1 mg/dL (ref 0.3–1.2)
Total Protein: 7.5 g/dL (ref 6.5–8.1)

## 2016-05-19 LAB — GLUCOSE, CAPILLARY
GLUCOSE-CAPILLARY: 239 mg/dL — AB (ref 65–99)
GLUCOSE-CAPILLARY: 270 mg/dL — AB (ref 65–99)

## 2016-05-19 LAB — TROPONIN I
TROPONIN I: 0.12 ng/mL — AB (ref <0.031)
Troponin I: 0.08 ng/mL — ABNORMAL HIGH
Troponin I: 0.11 ng/mL — ABNORMAL HIGH (ref <0.031)

## 2016-05-19 LAB — TSH: TSH: 1.508 u[IU]/mL (ref 0.350–4.500)

## 2016-05-19 LAB — MAGNESIUM: MAGNESIUM: 1.5 mg/dL — AB (ref 1.7–2.4)

## 2016-05-19 MED ORDER — HYDROCODONE-ACETAMINOPHEN 5-325 MG PO TABS
1.0000 | ORAL_TABLET | Freq: Four times a day (QID) | ORAL | Status: DC | PRN
  Administered 2016-05-19 – 2016-05-20 (×3): 1 via ORAL
  Filled 2016-05-19 (×3): qty 1

## 2016-05-19 MED ORDER — ACETAMINOPHEN 325 MG PO TABS: 650.0000 mg | ORAL_TABLET | Freq: Four times a day (QID) | ORAL | Status: DC | PRN

## 2016-05-19 MED ORDER — BISACODYL 5 MG PO TBEC: 5.0000 mg | DELAYED_RELEASE_TABLET | Freq: Every day | ORAL | Status: DC | PRN

## 2016-05-19 MED ORDER — ACETAMINOPHEN 650 MG RE SUPP: 650.0000 mg | Freq: Four times a day (QID) | RECTAL | Status: DC | PRN

## 2016-05-19 MED ORDER — COLCHICINE 0.6 MG PO TABS
0.6000 mg | ORAL_TABLET | Freq: Every day | ORAL | Status: DC
  Administered 2016-05-20 – 2016-05-21 (×2): 0.6 mg via ORAL
  Filled 2016-05-19 (×2): qty 1

## 2016-05-19 MED ORDER — SPIRONOLACTONE 25 MG PO TABS
25.0000 mg | ORAL_TABLET | Freq: Every day | ORAL | Status: DC
  Administered 2016-05-20 – 2016-05-21 (×2): 25 mg via ORAL
  Filled 2016-05-19 (×2): qty 1

## 2016-05-19 MED ORDER — HYDRALAZINE HCL 20 MG/ML IJ SOLN: 10.0000 mg | Freq: Four times a day (QID) | INTRAMUSCULAR | Status: DC | PRN

## 2016-05-19 MED ORDER — ENOXAPARIN SODIUM 40 MG/0.4ML ~~LOC~~ SOLN
40.0000 mg | SUBCUTANEOUS | Status: DC
  Administered 2016-05-19 – 2016-05-20 (×2): 40 mg via SUBCUTANEOUS
  Filled 2016-05-19 (×2): qty 0.4

## 2016-05-19 MED ORDER — DIPHENHYDRAMINE HCL 25 MG PO TABS: 25.0000 mg | ORAL_TABLET | Freq: Three times a day (TID) | ORAL | Status: DC | PRN

## 2016-05-19 MED ORDER — ASPIRIN EC 81 MG PO TBEC
81.0000 mg | DELAYED_RELEASE_TABLET | Freq: Every day | ORAL | Status: DC
  Administered 2016-05-20 – 2016-05-21 (×2): 81 mg via ORAL
  Filled 2016-05-19 (×2): qty 1

## 2016-05-19 MED ORDER — FINASTERIDE 5 MG PO TABS
5.0000 mg | ORAL_TABLET | Freq: Every day | ORAL | Status: DC
  Administered 2016-05-20 – 2016-05-21 (×2): 5 mg via ORAL
  Filled 2016-05-19 (×2): qty 1

## 2016-05-19 MED ORDER — ZOLPIDEM TARTRATE 5 MG PO TABS
5.0000 mg | ORAL_TABLET | Freq: Every evening | ORAL | Status: DC | PRN
  Filled 2016-05-19: qty 1

## 2016-05-19 MED ORDER — NITROGLYCERIN 2 % TD OINT
1.0000 [in_us] | TOPICAL_OINTMENT | Freq: Once | TRANSDERMAL | Status: AC
  Administered 2016-05-19: 1 [in_us] via TOPICAL
  Filled 2016-05-19: qty 1

## 2016-05-19 MED ORDER — HYDRALAZINE HCL 20 MG/ML IJ SOLN: 10.0000 mg | Freq: Once | INTRAMUSCULAR | Status: DC

## 2016-05-19 MED ORDER — FUROSEMIDE 10 MG/ML IJ SOLN
80.0000 mg | Freq: Two times a day (BID) | INTRAMUSCULAR | Status: DC
  Administered 2016-05-19 – 2016-05-21 (×4): 80 mg via INTRAVENOUS
  Filled 2016-05-19 (×4): qty 8

## 2016-05-19 MED ORDER — SERTRALINE HCL 50 MG PO TABS
50.0000 mg | ORAL_TABLET | Freq: Every day | ORAL | Status: DC
  Administered 2016-05-21: 50 mg via ORAL
  Filled 2016-05-19 (×2): qty 1

## 2016-05-19 MED ORDER — ATORVASTATIN CALCIUM 40 MG PO TABS
40.0000 mg | ORAL_TABLET | Freq: Every day | ORAL | Status: DC
  Administered 2016-05-20: 40 mg via ORAL
  Filled 2016-05-19: qty 1

## 2016-05-19 MED ORDER — CARVEDILOL 12.5 MG PO TABS
12.5000 mg | ORAL_TABLET | Freq: Two times a day (BID) | ORAL | Status: DC
  Administered 2016-05-20 – 2016-05-21 (×3): 12.5 mg via ORAL
  Filled 2016-05-19 (×3): qty 1

## 2016-05-19 MED ORDER — ALBUTEROL SULFATE (2.5 MG/3ML) 0.083% IN NEBU
5.0000 mg | INHALATION_SOLUTION | Freq: Once | RESPIRATORY_TRACT | Status: AC
  Administered 2016-05-19: 5 mg via RESPIRATORY_TRACT
  Filled 2016-05-19: qty 6

## 2016-05-19 MED ORDER — INSULIN ASPART 100 UNIT/ML ~~LOC~~ SOLN
0.0000 [IU] | Freq: Three times a day (TID) | SUBCUTANEOUS | Status: DC
  Administered 2016-05-20: 3 [IU] via SUBCUTANEOUS
  Administered 2016-05-20: 5 [IU] via SUBCUTANEOUS
  Administered 2016-05-21 (×2): 2 [IU] via SUBCUTANEOUS

## 2016-05-19 MED ORDER — FUROSEMIDE 10 MG/ML IJ SOLN
60.0000 mg | Freq: Once | INTRAMUSCULAR | Status: AC
  Administered 2016-05-19: 60 mg via INTRAVENOUS
  Filled 2016-05-19: qty 6

## 2016-05-19 MED ORDER — HYDRALAZINE HCL 25 MG PO TABS
25.0000 mg | ORAL_TABLET | Freq: Three times a day (TID) | ORAL | Status: DC
  Administered 2016-05-19 – 2016-05-21 (×5): 25 mg via ORAL
  Filled 2016-05-19 (×5): qty 1

## 2016-05-19 MED ORDER — POLYETHYLENE GLYCOL 3350 17 G PO PACK: 17.0000 g | PACK | Freq: Every day | ORAL | Status: DC | PRN

## 2016-05-19 MED ORDER — ALLOPURINOL 100 MG PO TABS
200.0000 mg | ORAL_TABLET | Freq: Every day | ORAL | Status: DC
  Administered 2016-05-20 – 2016-05-21 (×2): 200 mg via ORAL
  Filled 2016-05-19 (×2): qty 2

## 2016-05-19 MED ORDER — SODIUM CHLORIDE 0.9% FLUSH
3.0000 mL | Freq: Two times a day (BID) | INTRAVENOUS | Status: DC
  Administered 2016-05-19 – 2016-05-21 (×4): 3 mL via INTRAVENOUS

## 2016-05-19 MED ORDER — METHYLPREDNISOLONE SODIUM SUCC 125 MG IJ SOLR
125.0000 mg | Freq: Once | INTRAMUSCULAR | Status: AC
  Administered 2016-05-19: 125 mg via INTRAVENOUS
  Filled 2016-05-19: qty 2

## 2016-05-19 MED ORDER — TAMSULOSIN HCL 0.4 MG PO CAPS
0.4000 mg | ORAL_CAPSULE | Freq: Every day | ORAL | Status: DC
  Administered 2016-05-19 – 2016-05-20 (×2): 0.4 mg via ORAL
  Filled 2016-05-19 (×2): qty 1

## 2016-05-19 NOTE — ED Notes (Signed)
Pt in via [facility], per report pt had increased SOB while sitting on sofa today, hx of the same, pt denies CP, pt takes Lasix daily, denies CP, pt speaks in short sentences, tachypneic

## 2016-05-19 NOTE — H&P (Signed)
History and Physical    Ryan Macdonald ZOX:[id number] DOB: [birth year] DOA: 05/19/2016  PCP: [facility]  Patient coming from:   home    Chief Complaint: Shortness of breath  HPI: Ryan Macdonald is a 82 y.o. male with medical history significant for  limited to,  congestive heart failure, diabetes, and hypertension. Patient presented to the emergency department for shortness of breath which started this a.m. while watching television. Patient was tachypneic on arrival, he was tachycardic.  Symptoms improved on BiPAP, breathing comfortably on O2 per nasal cannula now.  Approximately one week ago patient noticed lower extremity edema, he added an extra Lasix with his morning dose x3 days and swelling improved. No weight gain, weight actually down from 250-247 pounds. He has been urinating normal amounts . Patient denies chest pain. No cough. He has been compliant with diuretics and low salt diet. Patient was last hospitalized for CHF in September 2016. Since then he has led a fairly active life with daily finishing . He can walk around without limitations Followed at the [facility]. Patient has a defibrillator.    ED Course:  60 mg Lasix IV Solu-Medrol  Review of Systems: As per HPI, otherwise 10 point review of systems negative.    Past Medical History  Diagnosis Date  . Nonischemic cardiomyopathy (HCC)     a. ? 2009 Cath in [place] - nl cors per pt;  b. 09/2013 Echo: EF 25-30%, sev diff HK.  Marland Kitchen Hypertension   . Cat scratch fever     removed mass on right side neck  . Cataracts, bilateral   . High cholesterol   . Osteoarthritis   . Kidney stones   . Morbid obesity (HCC)   . PAF (paroxysmal atrial fibrillation) (HCC)     a. post-op hip in 2014 - prev on xarelto.  . CHF (congestive heart failure) (HCC)   . Shortness of breath dyspnea   . Diabetes mellitus without complication (HCC)     TYPE 2    Past Surgical History  Procedure Laterality Date  . Kidney stone  surgery Right 1970's; 1983    "twice"  . Tonsillectomy and adenoidectomy  1940's  . Cystoscopy with urethral dilatation  10/23/2013  . Total hip arthroplasty Left 10/23/2013  . Inguinal hernia repair Right 1950's  . Circumcision  1940's  . Incision and drainage of wound Right 1980's    "cat scratch" (10/23/2013)  . Cardiac catheterization  1980's  . Total hip arthroplasty Left 10/23/2013    Procedure: TOTAL HIP ARTHROPLASTY;  Surgeon: Valeria Batman, MD;  Location: [facility] OR;  Service: Orthopedics;  Laterality: Left;  . Cystoscopy with urethral dilatation N/A 10/23/2013    Procedure: CYSTOSCOPY WITH URETHRAL DILATATION;  Surgeon: Kathi Ludwig, MD;  Location: [facility] OR;  Service: Urology;  Laterality: N/A;    Social History   Social History  . Marital Status: Married    Spouse Name: N/A  . Number of Children: N/A  . Years of Education: N/A   Occupational History  . Not on file.   Social History Main Topics  . Smoking status: Former Smoker -- 1.00 packs/day for 15 years    Types: Cigarettes, Cigars  . Smokeless tobacco: Former Neurosurgeon     Comment: quit smoking ~ 40 yr ago  . Alcohol Use: No     Comment: rare beer.  . Drug Use: No  . Sexual Activity: Not on file   Other Topics Concern  . Not on  file   Social History Narrative   Lives in [place] with wife.  Does not routinely exercise.    Allergies  Allergen Reactions  . Lisinopril Swelling    Family History  Problem Relation Age of Onset  . Other      negative for premature CAD  . Heart Problems Maternal Grandmother     Prior to Admission medications   Medication Sig Start Date End Date Taking? Authorizing Provider  allopurinol (ZYLOPRIM) 100 MG tablet Take 200 mg by mouth daily.   Yes Historical Provider, MD  aspirin EC 81 MG tablet Take 81 mg by mouth daily.   Yes Historical Provider, MD  carvedilol (COREG) 12.5 MG tablet Take 12.5 mg by mouth 2 (two) times daily with a meal.   Yes Historical Provider, MD    colchicine 0.6 MG tablet Take 1 tablet (0.6 mg total) by mouth daily. 04/09/16 05/19/16 Yes Isa Rankin, MD  cyanocobalamin 1000 MCG tablet Take 1,000 mcg by mouth daily.    Yes Historical Provider, MD  diphenhydrAMINE (BENADRYL) 25 MG tablet Take 25 mg by mouth every 8 (eight) hours as needed for sleep.   Yes Historical Provider, MD  finasteride (PROSCAR) 5 MG tablet Take 5 mg by mouth daily.   Yes Historical Provider, MD  furosemide (LASIX) 40 MG tablet Take 1 tablet (40 mg total) by mouth daily. Patient taking differently: Take 40 mg by mouth 2 (two) times daily as needed for fluid.  05/27/15  Yes Clydia Llano, MD  glipiZIDE (GLUCOTROL) 5 MG tablet Take 5 mg by mouth daily before breakfast.   Yes Historical Provider, MD  hydrALAZINE (APRESOLINE) 25 MG tablet Take 25 mg by mouth 3 (three) times daily.   Yes Historical Provider, MD  HYDROcodone-acetaminophen (NORCO/VICODIN) 5-325 MG tablet Take 2 tablets by mouth every 4 (four) hours as needed. 04/09/16  Yes Isa Rankin, MD  isosorbide dinitrate (DILATRATE-SR) 40 MG CR capsule Take 1 capsule (40 mg total) by mouth every 12 (twelve) hours. Patient taking differently: Take 40 mg by mouth 3 (three) times daily.  08/27/15  Yes [name], MD  ISOSORBIDE DINITRATE PO Take 40 mg by mouth 3 (three) times daily.   Yes Historical Provider, MD  ketoconazole (NIZORAL) 2 % shampoo Apply 1 application topically 2 (two) times a week.   Yes Historical Provider, MD  sertraline (ZOLOFT) 25 MG tablet Take 50 mg by mouth daily.   Yes Historical Provider, MD  simvastatin (ZOCOR) 80 MG tablet Take 40 mg by mouth daily.   Yes Historical Provider, MD  spironolactone (ALDACTONE) 25 MG tablet Take 0.5 tablets (12.5 mg total) by mouth daily. Patient taking differently: Take 25 mg by mouth daily.  05/27/15  Yes Clydia Llano, MD  tamsulosin (FLOMAX) 0.4 MG CAPS capsule Take 0.4 mg by mouth at bedtime.   Yes Historical Provider, MD  predniSONE (DELTASONE) 10 MG tablet Take  1 tablet (10 mg total) by mouth daily with breakfast. Take 40mg  daily for the next 3 days, then 20mg  daily for 3 days, then 10mg  daily for 4 days. 04/09/16   Isa Rankin, MD    Physical Exam: Filed Vitals:   05/19/16 1600 05/19/16 1630 05/19/16 1700 05/19/16 1730  BP: 127/91 138/87 142/80 147/95  Pulse: 86 91 84 88  Temp:      TempSrc:      Resp: 25 25 20 20   Height:      Weight:      SpO2: 96% 97% 98% 99%  Constitutional:  Pleasant, obese black male in NAD, calm, comfortable Filed Vitals:   05/19/16 1600 05/19/16 1630 05/19/16 1700 05/19/16 1730  BP: 127/91 138/87 142/80 147/95  Pulse: 86 91 84 88  Temp:      TempSrc:      Resp: Height:      Weight:      SpO2: 96% 97% 98% 99%   Eyes: PER, lids and conjunctivae normal ENMT: Mucous membranes are moist. Posterior pharynx clear of any exudate or lesions.Normal dentition.  Neck: normal, supple, no masses Respiratory: Decreased breath sounds at bilateral bases , no wheezing, no crackles. Normal respiratory effort. No accessory muscle use.  Cardiovascular: sinus tachy, 2+ BLE edema. 1+ pedal pulses.   Abdomen: soft, obese, no tenderness, no masses palpated. Bowel sounds positive.  Musculoskeletal: no clubbing / cyanosis. No joint deformity upper and lower extremities. Good ROM, no contractures. Normal muscle tone.  Skin: no rashes, lesions, ulcers.  Neurologic: CN 2-12 grossly intact. Sensation intact, Strength 5/5 in all 4.  Psychiatric: Normal judgment and insight. Alert and oriented x 3. Normal mood.   Labs on Admission: I have personally reviewed following labs and imaging studies  Urine analysis:    Component Value Date/Time   COLORURINE YELLOW 05/26/2015 1521   APPEARANCEUR CLEAR 05/26/2015 1521   LABSPEC 1.013 05/26/2015 1521   PHURINE 5.0 05/26/2015 1521   GLUCOSEU NEGATIVE 05/26/2015 1521   HGBUR MODERATE* 05/26/2015 1521   BILIRUBINUR NEGATIVE 05/26/2015 1521   KETONESUR NEGATIVE 05/26/2015 1521    PROTEINUR NEGATIVE 05/26/2015 1521   UROBILINOGEN 0.2 05/26/2015 1521   NITRITE NEGATIVE 05/26/2015 1521   LEUKOCYTESUR MODERATE* 05/26/2015 1521    Radiological Exams on Admission: Dg Chest Portable 1 View  05/19/2016  CLINICAL DATA:  Shortness of breath EXAM: PORTABLE CHEST 1 VIEW COMPARISON:  01/22/2016 FINDINGS: Left AICD remains in place, unchanged. There is cardiomegaly. Vascular congestion noted diffuse interstitial prominence may reflect interstitial edema. No effusions or acute bony abnormality. IMPRESSION: Cardiomegaly with interstitial opacities, likely mild edema. Electronically Signed   By: Charlett Nose M.D.   On: 05/19/2016 11:55    EKG: Independently reviewed.   EKG Interpretation  Date/Time:  Wednesday May 19 2016 11:30:29 EDT Ventricular Rate:  120 PR Interval:    QRS Duration: 94 QT Interval:  406 QTC Calculation: 574 R Axis:   61 Text Interpretation:  Sinus tachycardia Low voltage, precordial leads Prolonged QT interval Artifact Non-specific intra-ventricular conduction delay Abnormal ekg Confirmed by Gerhard Munch  MD ([id number]) on 05/19/2016 11:40:53 AM      Assessment/Plan   Acute on chronic systolic heart failure (HCC) with respiratory distress. Followed at [facility].  Echo Sept 2017 admission at [facility] - EF of 20-25%. BNP 243,  volume overload on cxr. -Place in observation-telemetry -Heart failure order set utilized -follow up on echocardiogram -IV Lasix 80 mg twice a day -continue home aldactone and coreg -Monitor weight, intake and output -When necessary oxygen per nasal cannula  Elevated troponin. No acute EKG changes -Monitor on telemetry -Cycle troponins, if continues to rise will need cardiology evaluation.   Prolonged Q-T interval on ECG -Avoid QT prolonging medications -K+ ok, check Mg+ level -A.m. EKG  Hx of PAF, presented today with sinus tachycardia. Resolving -monitor on telemetry  Hyperlipidemia -Continue home statin  CKD (chronic  kidney disease) stage III. Renal function actually normal today -Avoid nephrotoxic medications -Follow renal function  Depression, stable. -continue home zoloft    Gout, stable.  -  continue home gout medications    DVT prophylaxis:   Lovenox Code Status:   Full code  Family Communication:   Spoke with wife in room. She understands and agrees with plan of care.  Disposition Plan:  Discharge home in 24-48 hours              Consults called:   none Admission status:  Observation - telemetry    Willette Cluster NP [facility] Pager [phone]  If 7PM-7AM, please contact night-coverage [email or web address] Password [facility]  05/19/2016, 5:54 PM

## 2016-05-19 NOTE — ED Notes (Signed)
Per Jeraldine Loots, MD pt to have Nitro paste taken from 1 inch to 0.5 inch nitro paste, this RN removed paste per MD request

## 2016-05-19 NOTE — Progress Notes (Signed)
Pt refuse PRN NIV for the night. Pt is stable at this time, family at the bedside.

## 2016-05-19 NOTE — ED Notes (Signed)
Ok for pt to eat per Dr. Konrad Dolores.

## 2016-05-19 NOTE — ED Provider Notes (Signed)
CSN: [id number]     Arrival date & time    History   First MD Initiated Contact with Patient 05/19/16 1123     Chief Complaint  Patient presents with  . Shortness of Breath     (Consider location/radiation/quality/duration/timing/severity/associated sxs/prior Treatment) HPI Patient presents in respiratory distress via EMS. Level V caveat secondary to condition. Per report the patient was well until about one hour prior to calling EMS. About that time he developed dyspnea. EMS reports that the patient was diaphoretic, tachypneic, tachycardic, incapable of sustaining CPAP in route.  Patient himself can only speak normal amount states that he feels like he is"going down"but denies pain.  Past Medical History  Diagnosis Date  . Nonischemic cardiomyopathy (HCC)     a. ? 2009 Cath in [place] - nl cors per pt;  b. 09/2013 Echo: EF 25-30%, sev diff HK.  Marland Kitchen Hypertension   . Cat scratch fever     removed mass on right side neck  . Cataracts, bilateral   . High cholesterol   . Osteoarthritis   . Kidney stones   . Morbid obesity (HCC)   . PAF (paroxysmal atrial fibrillation) (HCC)     a. post-op hip in 2014 - prev on xarelto.  . CHF (congestive heart failure) (HCC)   . Shortness of breath dyspnea   . Diabetes mellitus without complication (HCC)     TYPE 2   Past Surgical History  Procedure Laterality Date  . Kidney stone surgery Right 1970's; 1983    "twice"  . Tonsillectomy and adenoidectomy  1940's  . Cystoscopy with urethral dilatation  10/23/2013  . Total hip arthroplasty Left 10/23/2013  . Inguinal hernia repair Right 1950's  . Circumcision  1940's  . Incision and drainage of wound Right 1980's    "cat scratch" (10/23/2013)  . Cardiac catheterization  1980's  . Total hip arthroplasty Left 10/23/2013    Procedure: TOTAL HIP ARTHROPLASTY;  Surgeon: Valeria Batman, MD;  Location: [facility] OR;  Service: Orthopedics;  Laterality: Left;  . Cystoscopy with urethral dilatation N/A  10/23/2013    Procedure: CYSTOSCOPY WITH URETHRAL DILATATION;  Surgeon: Kathi Ludwig, MD;  Location: [facility] OR;  Service: Urology;  Laterality: N/A;   Family History  Problem Relation Age of Onset  . Other      negative for premature CAD  . Heart Problems Maternal Grandmother    Social History  Substance Use Topics  . Smoking status: Former Smoker -- 1.00 packs/day for 15 years    Types: Cigarettes, Cigars  . Smokeless tobacco: Former Neurosurgeon     Comment: quit smoking ~ 40 yr ago  . Alcohol Use: No     Comment: rare beer.    Review of Systems  Unable to perform ROS: Acuity of condition      Allergies  Lisinopril  Home Medications   Prior to Admission medications   Medication Sig Start Date End Date Taking? Authorizing Provider  allopurinol (ZYLOPRIM) 100 MG tablet Take 200 mg by mouth daily.   Yes Historical Provider, MD  aspirin EC 81 MG tablet Take 81 mg by mouth daily.   Yes Historical Provider, MD  carvedilol (COREG) 12.5 MG tablet Take 12.5 mg by mouth 2 (two) times daily with a meal.   Yes Historical Provider, MD  colchicine 0.6 MG tablet Take 1 tablet (0.6 mg total) by mouth daily. 04/09/16 05/19/16 Yes Isa Rankin, MD  cyanocobalamin 1000 MCG tablet Take 1,000 mcg by mouth daily.  Yes Historical Provider, MD  diphenhydrAMINE (BENADRYL) 25 MG tablet Take 25 mg by mouth every 8 (eight) hours as needed for sleep.   Yes Historical Provider, MD  finasteride (PROSCAR) 5 MG tablet Take 5 mg by mouth daily.   Yes Historical Provider, MD  furosemide (LASIX) 40 MG tablet Take 1 tablet (40 mg total) by mouth daily. Patient taking differently: Take 40 mg by mouth 2 (two) times daily as needed for fluid.  05/27/15  Yes Clydia Llano, MD  glipiZIDE (GLUCOTROL) 5 MG tablet Take 5 mg by mouth daily before breakfast.   Yes Historical Provider, MD  hydrALAZINE (APRESOLINE) 25 MG tablet Take 25 mg by mouth 3 (three) times daily.   Yes Historical Provider, MD  HYDROcodone-acetaminophen  (NORCO/VICODIN) 5-325 MG tablet Take 2 tablets by mouth every 4 (four) hours as needed. 04/09/16  Yes Isa Rankin, MD  isosorbide dinitrate (DILATRATE-SR) 40 MG CR capsule Take 1 capsule (40 mg total) by mouth every 12 (twelve) hours. Patient taking differently: Take 40 mg by mouth 3 (three) times daily.  08/27/15  Yes [name], MD  ISOSORBIDE DINITRATE PO Take 40 mg by mouth 3 (three) times daily.   Yes Historical Provider, MD  ketoconazole (NIZORAL) 2 % shampoo Apply 1 application topically 2 (two) times a week.   Yes Historical Provider, MD  sertraline (ZOLOFT) 25 MG tablet Take 50 mg by mouth daily.   Yes Historical Provider, MD  simvastatin (ZOCOR) 80 MG tablet Take 40 mg by mouth daily.   Yes Historical Provider, MD  spironolactone (ALDACTONE) 25 MG tablet Take 0.5 tablets (12.5 mg total) by mouth daily. Patient taking differently: Take 25 mg by mouth daily.  05/27/15  Yes Clydia Llano, MD  tamsulosin (FLOMAX) 0.4 MG CAPS capsule Take 0.4 mg by mouth at bedtime.   Yes Historical Provider, MD  predniSONE (DELTASONE) 10 MG tablet Take 1 tablet (10 mg total) by mouth daily with breakfast. Take  daily for the next 3 days, then  daily for 3 days, then  daily for 4 days. 04/09/16   Isa Rankin, MD   BP 123/84 mmHg  Pulse 96  Temp(Src) 98.4 F (36.9 C) (Axillary)  Resp 22  Ht  (1.702 m)  Wt 247 lb (112.038 kg)  BMI 38.68 kg/m2  SpO2 94% Physical Exam  Constitutional: He is oriented to person, place, and time. He appears well-developed. He appears distressed.  Obese elderly appearing male respiratory distress  HENT:  Head: Normocephalic and atraumatic.  Eyes: Conjunctivae and EOM are normal.  Cardiovascular: Regular rhythm.  Tachycardia present.   Pulmonary/Chest: Accessory muscle usage present. No stridor. He is in respiratory distress. He has decreased breath sounds. He has wheezes.  Abdominal: He exhibits no distension.  Musculoskeletal: He exhibits no edema.   Neurological: He is alert and oriented to person, place, and time.  Skin: Skin is warm. He is diaphoretic.  Psychiatric: He has a normal mood and affect.  Nursing note and vitals reviewed.   ED Course  Procedures (including critical care time)   Immediately after the initial interventions the patient received noninvasive ventilatory support via B IPAP. Patient had nitroglycerin paste applied to his chest, hydralazine, Lasix administered IV. Patient's initial oxygen saturation was 94% with nonrebreather mask, this improved to 100% with noninvasive ventilatory support.   Update: Now, 10 minutes after BiPAP started, the patient has decreased tachypnea.    Labs Review Labs Reviewed  COMPREHENSIVE METABOLIC PANEL - Abnormal; Notable for the following:  Glucose, Bld 158 (*)    AST 53 (*)    GFR calc non Af Amer 55 (*)    All other components within normal limits  CBC WITH DIFFERENTIAL/PLATELET - Abnormal; Notable for the following:    WBC 15.3 (*)    RBC 4.17 (*)    RDW 15.7 (*)    Neutro Abs 8.9 (*)    Lymphs Abs 4.9 (*)    All other components within normal limits  TROPONIN I - Abnormal; Notable for the following:    Troponin I 0.08 (*)    All other components within normal limits  BRAIN NATRIURETIC PEPTIDE - Abnormal; Notable for the following:    B Natriuretic Peptide 243.9 (*)    All other components within normal limits  TROPONIN I - Abnormal; Notable for the following:    Troponin I 0.11 (*)    All other components within normal limits  TROPONIN I  TROPONIN I  TSH  URINALYSIS, ROUTINE W REFLEX MICROSCOPIC (NOT AT [facility])  HEMOGLOBIN A1C    Imaging Review Dg Chest Portable 1 View  05/19/2016  CLINICAL DATA:  Shortness of breath EXAM: PORTABLE CHEST 1 VIEW COMPARISON:  01/22/2016 FINDINGS: Left AICD remains in place, unchanged. There is cardiomegaly. Vascular congestion noted diffuse interstitial prominence may reflect interstitial edema. No effusions or acute bony  abnormality. IMPRESSION: Cardiomegaly with interstitial opacities, likely mild edema. Electronically Signed   By: Charlett Nose M.D.   On: 05/19/2016 11:55   I have personally reviewed and evaluated these images and lab results as part of my medical decision-making.   EKG Interpretation   Date/Time:  Wednesday May 19 2016 11:30:29 EDT Ventricular Rate:  120 PR Interval:    QRS Duration: 94 QT Interval:  406 QTC Calculation: 574 R Axis:   61 Text Interpretation:  Sinus tachycardia Low voltage, precordial leads  Prolonged QT interval Artifact Non-specific intra-ventricular conduction  delay Abnormal ekg Confirmed by Gerhard Munch  MD [id number]) on 05/19/2016  11:40:53 AM     Update: Now approximately 20 minutes after initiation of BiPAP patient is substantially better.  Update:, Patient appropriate for removal of BiPAP. Patient tolerating nasal cannula well, states that he feels generally better.   MDM   Final diagnoses:  Respiratory distress  Acute on chronic congestive heart failure, unspecified congestive heart failure type [facility])   Patient with multiple medical issues including heart failure presents in respiratory distress. Given the patient's elevated blood pressure, his tachypnea, tachycardia, restlessness, there is concern for flash pulmonary edema. Patient improved substantially with noninvasive ventilatory support, topical nitroglycerin, IV Lasix. Following this improvement the patient still required admission given his multiple medical issues, respiratory distress.  CRITICAL CARE Performed by: Gerhard Munch Total critical care time: 45 minutes Critical care time was exclusive of separately billable procedures and treating other patients. Critical care was necessary to treat or prevent imminent or life-threatening deterioration. Critical care was time spent personally by me on the following activities: development of treatment plan with patient and/or surrogate as  well as nursing, discussions with consultants, evaluation of patient's response to treatment, examination of patient, obtaining history from patient or surrogate, ordering and performing treatments and interventions, ordering and review of laboratory studies, ordering and review of radiographic studies, pulse oximetry and re-evaluation of patient's condition.   Gerhard Munch, MD 05/19/16 [phone]

## 2016-05-20 ENCOUNTER — Encounter (HOSPITAL_COMMUNITY): Payer: Self-pay

## 2016-05-20 ENCOUNTER — Observation Stay (HOSPITAL_BASED_OUTPATIENT_CLINIC_OR_DEPARTMENT_OTHER): Payer: Medicare Other

## 2016-05-20 DIAGNOSIS — E7800 Pure hypercholesterolemia, unspecified: Secondary | ICD-10-CM | POA: Diagnosis present

## 2016-05-20 DIAGNOSIS — I4581 Long QT syndrome: Secondary | ICD-10-CM | POA: Diagnosis present

## 2016-05-20 DIAGNOSIS — I130 Hypertensive heart and chronic kidney disease with heart failure and stage 1 through stage 4 chronic kidney disease, or unspecified chronic kidney disease: Secondary | ICD-10-CM | POA: Diagnosis present

## 2016-05-20 DIAGNOSIS — I5043 Acute on chronic combined systolic (congestive) and diastolic (congestive) heart failure: Secondary | ICD-10-CM | POA: Diagnosis present

## 2016-05-20 DIAGNOSIS — Z7982 Long term (current) use of aspirin: Secondary | ICD-10-CM | POA: Diagnosis not present

## 2016-05-20 DIAGNOSIS — M109 Gout, unspecified: Secondary | ICD-10-CM | POA: Diagnosis present

## 2016-05-20 DIAGNOSIS — Z87891 Personal history of nicotine dependence: Secondary | ICD-10-CM | POA: Diagnosis not present

## 2016-05-20 DIAGNOSIS — Z7984 Long term (current) use of oral hypoglycemic drugs: Secondary | ICD-10-CM | POA: Diagnosis not present

## 2016-05-20 DIAGNOSIS — I480 Paroxysmal atrial fibrillation: Secondary | ICD-10-CM

## 2016-05-20 DIAGNOSIS — R7989 Other specified abnormal findings of blood chemistry: Secondary | ICD-10-CM | POA: Diagnosis not present

## 2016-05-20 DIAGNOSIS — E1159 Type 2 diabetes mellitus with other circulatory complications: Secondary | ICD-10-CM | POA: Diagnosis present

## 2016-05-20 DIAGNOSIS — N183 Chronic kidney disease, stage 3 (moderate): Secondary | ICD-10-CM | POA: Diagnosis present

## 2016-05-20 DIAGNOSIS — J9621 Acute and chronic respiratory failure with hypoxia: Secondary | ICD-10-CM | POA: Insufficient documentation

## 2016-05-20 DIAGNOSIS — Z95810 Presence of automatic (implantable) cardiac defibrillator: Secondary | ICD-10-CM | POA: Diagnosis not present

## 2016-05-20 DIAGNOSIS — I428 Other cardiomyopathies: Secondary | ICD-10-CM | POA: Diagnosis present

## 2016-05-20 DIAGNOSIS — I248 Other forms of acute ischemic heart disease: Secondary | ICD-10-CM | POA: Diagnosis present

## 2016-05-20 DIAGNOSIS — I5023 Acute on chronic systolic (congestive) heart failure: Secondary | ICD-10-CM

## 2016-05-20 DIAGNOSIS — E1122 Type 2 diabetes mellitus with diabetic chronic kidney disease: Secondary | ICD-10-CM | POA: Diagnosis present

## 2016-05-20 DIAGNOSIS — R0600 Dyspnea, unspecified: Secondary | ICD-10-CM

## 2016-05-20 DIAGNOSIS — Z888 Allergy status to other drugs, medicaments and biological substances status: Secondary | ICD-10-CM | POA: Diagnosis not present

## 2016-05-20 DIAGNOSIS — Z6841 Body Mass Index (BMI) 40.0 and over, adult: Secondary | ICD-10-CM | POA: Diagnosis not present

## 2016-05-20 DIAGNOSIS — E785 Hyperlipidemia, unspecified: Secondary | ICD-10-CM | POA: Diagnosis not present

## 2016-05-20 DIAGNOSIS — Z96642 Presence of left artificial hip joint: Secondary | ICD-10-CM | POA: Diagnosis present

## 2016-05-20 DIAGNOSIS — F329 Major depressive disorder, single episode, unspecified: Secondary | ICD-10-CM

## 2016-05-20 DIAGNOSIS — I10 Essential (primary) hypertension: Secondary | ICD-10-CM

## 2016-05-20 DIAGNOSIS — Z86711 Personal history of pulmonary embolism: Secondary | ICD-10-CM | POA: Diagnosis not present

## 2016-05-20 LAB — CBC
HEMATOCRIT: 36.7 % — AB (ref 39.0–52.0)
HEMOGLOBIN: 11.8 g/dL — AB (ref 13.0–17.0)
MCH: 30.3 pg (ref 26.0–34.0)
MCHC: 32.2 g/dL (ref 30.0–36.0)
MCV: 94.3 fL (ref 78.0–100.0)
Platelets: 206 10*3/uL (ref 150–400)
RBC: 3.89 MIL/uL — AB (ref 4.22–5.81)
RDW: 15.2 % (ref 11.5–15.5)
WBC: 10.4 10*3/uL (ref 4.0–10.5)

## 2016-05-20 LAB — ECHOCARDIOGRAM COMPLETE
CHL CUP MV DEC (S): 198
E/e' ratio: 15.59
EWDT: 198 ms
FS: 12 % — AB (ref 28–44)
HEIGHTINCHES: 67 in
IVS/LV PW RATIO, ED: 1.08
LA ID, A-P, ES: 39 mm
LA diam end sys: 39 mm
LA vol index: 30.4 mL/m2
LADIAMINDEX: 1.72 cm/m2
LAVOL: 69 mL
LAVOLA4C: 60 mL
LDCA: 4.52 cm2
LV E/e' medial: 15.59
LV E/e'average: 15.59
LV PW d: 10.4 mm — AB (ref 0.6–1.1)
LVELAT: 5.92 cm/s
LVOTD: 24 mm
MV Peak grad: 3 mmHg
MV pk A vel: 66.1 m/s
MVPKEVEL: 92.3 m/s
RV LATERAL S' VELOCITY: 12.1 cm/s
TDI e' lateral: 5.92
TDI e' medial: 6.69
WEIGHTICAEL: 4200 [oz_av]

## 2016-05-20 LAB — URINALYSIS, ROUTINE W REFLEX MICROSCOPIC
Bilirubin Urine: NEGATIVE
GLUCOSE, UA: NEGATIVE mg/dL
Ketones, ur: NEGATIVE mg/dL
Nitrite: POSITIVE — AB
PH: 5 (ref 5.0–8.0)
PROTEIN: NEGATIVE mg/dL
SPECIFIC GRAVITY, URINE: 1.012 (ref 1.005–1.030)

## 2016-05-20 LAB — HEMOGLOBIN A1C
Hgb A1c MFr Bld: 6.4 % — ABNORMAL HIGH (ref 4.8–5.6)
Mean Plasma Glucose: 137 mg/dL

## 2016-05-20 LAB — BASIC METABOLIC PANEL
ANION GAP: 10 (ref 5–15)
BUN: 24 mg/dL — ABNORMAL HIGH (ref 6–20)
CHLORIDE: 102 mmol/L (ref 101–111)
CO2: 26 mmol/L (ref 22–32)
Calcium: 9.2 mg/dL (ref 8.9–10.3)
Creatinine, Ser: 1.24 mg/dL (ref 0.61–1.24)
GFR calc non Af Amer: 54 mL/min — ABNORMAL LOW (ref >60)
Glucose, Bld: 146 mg/dL — ABNORMAL HIGH (ref 65–99)
POTASSIUM: 4.2 mmol/L (ref 3.5–5.1)
Sodium: 138 mmol/L (ref 135–145)

## 2016-05-20 LAB — GLUCOSE, CAPILLARY
GLUCOSE-CAPILLARY: 225 mg/dL — AB (ref 65–99)
GLUCOSE-CAPILLARY: 129 mg/dL — AB (ref 65–99)
Glucose-Capillary: 118 mg/dL — ABNORMAL HIGH (ref 65–99)
Glucose-Capillary: 153 mg/dL — ABNORMAL HIGH (ref 65–99)

## 2016-05-20 LAB — URINE MICROSCOPIC-ADD ON

## 2016-05-20 LAB — MAGNESIUM: Magnesium: 1.5 mg/dL — ABNORMAL LOW (ref 1.7–2.4)

## 2016-05-20 LAB — TROPONIN I: TROPONIN I: 0.14 ng/mL — AB (ref <0.031)

## 2016-05-20 MED ORDER — ISOSORBIDE DINITRATE ER 40 MG PO TBCR
40.0000 mg | EXTENDED_RELEASE_TABLET | Freq: Three times a day (TID) | ORAL | Status: DC
  Filled 2016-05-20 (×2): qty 1

## 2016-05-20 MED ORDER — MAGNESIUM SULFATE IN D5W 1-5 GM/100ML-% IV SOLN
1.0000 g | Freq: Once | INTRAVENOUS | Status: AC
  Administered 2016-05-20: 1 g via INTRAVENOUS
  Filled 2016-05-20: qty 100

## 2016-05-20 MED ORDER — ISOSORBIDE DINITRATE 10 MG PO TABS
40.0000 mg | ORAL_TABLET | Freq: Three times a day (TID) | ORAL | Status: DC
  Administered 2016-05-20 – 2016-05-21 (×3): 40 mg via ORAL
  Filled 2016-05-20 (×3): qty 4

## 2016-05-20 MED ORDER — VITAMIN B-12 1000 MCG PO TABS
1000.0000 ug | ORAL_TABLET | Freq: Every day | ORAL | Status: DC
  Administered 2016-05-20 – 2016-05-21 (×2): 1000 ug via ORAL
  Filled 2016-05-20 (×2): qty 1

## 2016-05-20 NOTE — Progress Notes (Addendum)
Patient ID: Ryan Macdonald, male   DOB: 08-15-1934, 82 y.o.   MRN: [id number]  PROGRESS NOTE    Ryan Macdonald  WGN:[id number] DOB: [birth year] DOA: 05/19/2016  PCP: [facility]   Brief Narrative:  82 y.o. male with past medical history significant for chronic systolic and diastolic congestive heart failure with last 2-D echo in 2016 with ejection fraction of 20%, defibrillator placement, further history of essential hypertension, diabetes on glipizide, depression, dyslipidemia who presented to [facility] with worsening shortness of breath started the morning prior to this admission, at rest. Patient reports associated lower extremity swelling about week prior to this admission and he took extra dose of Lasix but no significant symptomatic relief. He doesn't think he gained any weight. He follows in [facility].  Patient was hemodynamically stable on the admission. His BNP was slightly elevated, 243. Blood work showed slight leukocytosis of 15.3. Troponin level was 0.11, 0.12 and 0.14. Chest x-ray showed cardiomegaly with interstitial opacities likely mild edema. He was started on IV Lasix.  Assessment & Plan:   Principal Problem:   Acute on chronic systolic and diastolic heart failure, NYHA IV (HCC) / ICD (implantable cardioverter-defibrillator) in place - Based on severity of the symptoms he is acutely decompensated CHF is in NYHA IV category - Last 2-D echo in 2016 with ejection fraction of 20% - 2-D echo on this admission is pending - Continue Lasix 80 mg IV every 12 hours - Continue carvedilol and spironolactone - Continue daily weight and strict intake and output - Appreciate cardiology consult and recommendations  Active Problems:   Dyslipidemia associated with Dr. diabetes mellitus - Continue Lipitor 40 mg at bedtime    Elevated troponin - Likely demand ischemia from acute decompensated CHF - No reports of chest pain - Follow-up on 2-D echo results       Controlled type 2 diabetes mellitus without complications without long-term insulin use - Currently on sliding scale insulin but at home he takes glipizide - A1c on this admission at goal    Essential hypertension - Continue carvedilol, hydralazine     Depression - Continue Zoloft - Patient is stable, not depressed  DVT prophylaxis: Lovenox subcutaneous Code Status: full code  Family Communication: No family at the bedside Disposition Plan: 05/21/2016   Consultants:   Cardiology  Procedures:   None  Antimicrobials:   None   Subjective: No respiratory distress.  Objective: Filed Vitals:   05/19/16 2128 05/20/16 0052 05/20/16 0514 05/20/16 0943  BP: 142/78 127/77 152/84 126/64  Pulse: 96 91 93 72  Temp: 98.6 F (37 C) 98.5 F (36.9 C) 97.8 F (36.6 C)   TempSrc: Oral Oral Oral   Resp: Height:      Weight:   119.069 kg (262 lb 8 oz)   SpO2: 97% 94% 97% 98%    Intake/Output Summary (Last 24 hours) at 05/20/16 1112 Last data filed at 05/20/16 0944  Gross per 24 hour  Intake    480 ml  Output   5100 ml  Net  -4620 ml   Filed Weights   05/19/16 1139 05/19/16 1856 05/20/16 0514  Weight: 112.038 kg (247 lb) 120.249 kg (265 lb 1.6 oz) 119.069 kg (262 lb 8 oz)    Examination:  General exam: Appears calm and comfortable  Respiratory system: Slight wheezing in upper lung lobes, no rhonchi Cardiovascular system: S1 & S2 heard, RRR.  Gastrointestinal system: Abdomen is nondistended, soft and nontender.  No organomegaly or masses felt. Normal bowel sounds heard. Central nervous system: Alert and oriented. No focal neurological deficits. Extremities: Symmetric 5 x 5 power. Skin: No rashes, lesions or ulcers Psychiatry: Judgement and insight appear normal. Mood & affect appropriate.   Data Reviewed: I have personally reviewed following labs and imaging studies  CBC:  Recent Labs Lab 05/19/16 1131 05/20/16 0308  WBC 15.3* 10.4  NEUTROABS  8.9*  --   HGB 13.3 11.8*  HCT 41.3 36.7*  MCV 99.0 94.3  PLT 214 206   Basic Metabolic Panel:  Recent Labs Lab 05/19/16 1131 05/19/16 2037 05/20/16 0308 05/20/16 0842  NA 140  --  138  --   K 4.4  --  4.2  --   CL 106  --  102  --   CO2 25  --  26  --   GLUCOSE 158*  --  146*  --   BUN 18  --  24*  --   CREATININE 1.21  --  1.24  --   CALCIUM 9.3  --  9.2  --   MG  --  1.5*  --  1.5*   GFR: Estimated Creatinine Clearance: 59.6 mL/min (by C-G formula based on Cr of 1.24). Liver Function Tests:  Recent Labs Lab 05/19/16 1131  AST 53*  ALT 54  ALKPHOS 52  BILITOT 1.1  PROT 7.5  ALBUMIN 3.5   No results for input(s): LIPASE, AMYLASE in the last 168 hours. No results for input(s): AMMONIA in the last 168 hours. Coagulation Profile: No results for input(s): INR, PROTIME in the last 168 hours. Cardiac Enzymes:  Recent Labs Lab 05/19/16 1131 05/19/16 1502 05/19/16 2037 05/20/16 0308  TROPONINI 0.08* 0.11* 0.12* 0.14*   BNP (last 3 results) No results for input(s): PROBNP in the last 8760 hours. HbA1C:  Recent Labs  05/19/16 1618  HGBA1C 6.4*   CBG:  Recent Labs Lab 05/19/16 1846 05/19/16 2127 05/20/16 0608  GLUCAP 239* 270* 225*   Lipid Profile: No results for input(s): CHOL, HDL, LDLCALC, TRIG, CHOLHDL, LDLDIRECT in the last 72 hours. Thyroid Function Tests:  Recent Labs  05/19/16 1618  TSH 1.508   Anemia Panel: No results for input(s): VITAMINB12, FOLATE, FERRITIN, TIBC, IRON, RETICCTPCT in the last 72 hours. Urine analysis:    Component Value Date/Time   COLORURINE YELLOW 05/20/2016 0301   APPEARANCEUR CLEAR 05/20/2016 0301   LABSPEC 1.012 05/20/2016 0301   PHURINE 5.0 05/20/2016 0301   GLUCOSEU NEGATIVE 05/20/2016 0301   HGBUR TRACE* 05/20/2016 0301   BILIRUBINUR NEGATIVE 05/20/2016 0301   KETONESUR NEGATIVE 05/20/2016 0301   PROTEINUR NEGATIVE 05/20/2016 0301   UROBILINOGEN 0.2 05/26/2015 1521   NITRITE POSITIVE* 05/20/2016  0301   LEUKOCYTESUR MODERATE* 05/20/2016 0301   Sepsis Labs: @LABRCNTIP (procalcitonin:4,lacticidven:4)   )No results found for this or any previous visit (from the past 240 hour(s)).    Radiology Studies: Dg Chest Portable 1 View 05/19/2016  Cardiomegaly with interstitial opacities, likely mild edema.    Scheduled Meds: . allopurinol  200 mg Oral Daily  . aspirin EC  81 mg Oral Daily  . atorvastatin  40 mg Oral q1800  . carvedilol  12.5 mg Oral BID WC  . colchicine  0.6 mg Oral Daily  . enoxaparin (LOVENOX) injection  40 mg Subcutaneous Q24H  . finasteride  5 mg Oral Daily  . furosemide  80 mg Intravenous Q12H  . hydrALAZINE  10 mg Intravenous Once  . hydrALAZINE  25 mg Oral TID  .  insulin aspart  0-15 Units Subcutaneous TID WC  . sertraline  50 mg Oral Daily  . sodium chloride flush  3 mL Intravenous Q12H  . spironolactone  25 mg Oral Daily  . tamsulosin  0.4 mg Oral QHS   Continuous Infusions:      Time spent: 25 minutes  Greater than 50% of the time spent on counseling and coordinating the care.   Manson Passey, MD [facility] Pager [phone]  If 7PM-7AM, please contact night-coverage [email or web address] Password [facility] 05/20/2016, 11:12 AM

## 2016-05-20 NOTE — Progress Notes (Signed)
Notified Dr.K.Schorr that Mag level 1.5 and Troponin 0.12 and now 0.14. Awaiting any orders.

## 2016-05-20 NOTE — Consult Note (Signed)
CARDIOLOGY CONSULT NOTE   Patient ID: Ryan Macdonald MRN: [id number], DOB/AGE: [birth year]   Admit date: 05/19/2016 Date of Consult: 05/20/2016   Primary Physician: [facility] Primary Cardiologist: Dr. Duke Salvia  Referred by Dr. Elisabeth Pigeon  Pt. Profile  Ryan Macdonald is a pleasant 82 year old African-American male with past medical history of chronic systolic and diastolic heart failure with baseline EF 15-20%, NICM s/p Biotronik ICD 10/2015, h/o PAF no longer on Xarelto due to severe hematuria, h/o PE, HTN, HLD, CKD stage III and NSVT presented with SOB and treated for acute on chronic combined HF and found to have elevated trop  Problem List  Past Medical History  Diagnosis Date  . Nonischemic cardiomyopathy (HCC)     a. ? 2009 Cath in [place] - nl cors per pt;  b. 09/2013 Echo: EF 25-30%, sev diff HK.  Marland Kitchen Hypertension   . Cat scratch fever     removed mass on right side neck  . Cataracts, bilateral   . High cholesterol   . Osteoarthritis   . Kidney stones   . Morbid obesity (HCC)   . PAF (paroxysmal atrial fibrillation) (HCC)     a. post-op hip in 2014 - prev on xarelto.  . CHF (congestive heart failure) (HCC)   . Shortness of breath dyspnea   . Diabetes mellitus without complication (HCC)     TYPE 2    Past Surgical History  Procedure Laterality Date  . Kidney stone surgery Right 1970's; 1983    "twice"  . Tonsillectomy and adenoidectomy  1940's  . Cystoscopy with urethral dilatation  10/23/2013  . Total hip arthroplasty Left 10/23/2013  . Inguinal hernia repair Right 1950's  . Circumcision  1940's  . Incision and drainage of wound Right 1980's    "cat scratch" (10/23/2013)  . Cardiac catheterization  1980's  . Total hip arthroplasty Left 10/23/2013    Procedure: TOTAL HIP ARTHROPLASTY;  Surgeon: Valeria Batman, MD;  Location: [facility] OR;  Service: Orthopedics;  Laterality: Left;  . Cystoscopy with urethral dilatation N/A 10/23/2013    Procedure: CYSTOSCOPY  WITH URETHRAL DILATATION;  Surgeon: Kathi Ludwig, MD;  Location: [facility] OR;  Service: Urology;  Laterality: N/A;     Allergies  Allergies  Allergen Reactions  . Lisinopril Swelling    HPI   Ryan Macdonald is a pleasant 82 year old African-American male with past medical history of chronic systolic and diastolic heart failure with baseline EF 15-20%, NICM s/p Biotronik ICD 10/2015, h/o PAF no longer on Xarelto due to severe hematuria, h/o PE, HTN, HLD, CKD stage III and NSVT. Patient had history of cardiac catheterization in 2009 in [place] which showed clean coronaries. Record is not available at this time, and the patient cannot recall which hospital it was but states it was not [facility] [place]. He had episodes of paroxysmal atrial fibrillation in the past, however due to severe severe hematuria, he is no longer taking Xarelto. He is currently on aspirin only. CTA of the chest on 05/25/2015 showed no evidence of PE, staghorn calculus in the right kidney. His last echocardiogram obtained in December 2016 showed EF 15-20%. [facility] Since his Biotronik ICD implantation, he has been doing well. He denies any recent fever, chill, cough. He does have occasional lower extremity edema and a taken additional dose of Lasix for it. He is baseline dry weight is between 247 pounds - 255 pounds. He says he measured his  weight daily, avoid salt, and limit to less than 2 L fluid intake per day.   Patient also enjoys fishing and goes out frequently to finish. He makes his own meals with deferring ingredients but very little salt due to his history of heart failure. He denies any recent exertional chest discomfort. He was in his usual state of health until 05/19/2016, when he started having increasing shortness of breath. He was eventually admitted by hospitalist service. Initial chest x-ray shows mild edema with interstitial opacitiy. Urinalysis shows rare  bacteria and positive nitrite.   Inpatient Medications  . allopurinol  200 mg Oral Daily  . aspirin EC  81 mg Oral Daily  . atorvastatin  40 mg Oral q1800  . carvedilol  12.5 mg Oral BID WC  . colchicine  0.6 mg Oral Daily  . enoxaparin (LOVENOX) injection  40 mg Subcutaneous Q24H  . finasteride  5 mg Oral Daily  . furosemide  80 mg Intravenous Q12H  . hydrALAZINE  10 mg Intravenous Once  . hydrALAZINE  25 mg Oral TID  . insulin aspart  0-15 Units Subcutaneous TID WC  . sertraline  50 mg Oral Daily  . sodium chloride flush  3 mL Intravenous Q12H  . spironolactone  25 mg Oral Daily  . tamsulosin  0.4 mg Oral QHS  . vitamin B-12  1,000 mcg Oral Daily    Family History Family History  Problem Relation Age of Onset  . Other      negative for premature CAD  . Heart Problems Maternal Grandmother      Social History Social History   Social History  . Marital Status: Married    Spouse Name: N/A  . Number of Children: N/A  . Years of Education: N/A   Occupational History  . Not on file.   Social History Main Topics  . Smoking status: Former Smoker -- 1.00 packs/day for 15 years    Types: Cigarettes, Cigars  . Smokeless tobacco: Former Neurosurgeon     Comment: quit smoking ~ 40 yr ago  . Alcohol Use: No     Comment: rare beer.  . Drug Use: No  . Sexual Activity: Yes   Other Topics Concern  . Not on file   Social History Narrative   Lives in [place] with wife.  Does not routinely exercise.     Review of Systems  General:  No chills, fever, night sweats or weight changes.  Cardiovascular:  No chest pain, orthopnea, palpitations, paroxysmal nocturnal dyspnea. +dyspnea on exertion, edema Dermatological: No rash, lesions/masses Respiratory: No cough, dyspnea Urologic: No hematuria, dysuria Abdominal:   No nausea, vomiting, diarrhea, bright red blood per rectum, melena, or hematemesis Neurologic:  No visual changes, wkns, changes in mental status. All other systems reviewed  and are otherwise negative except as noted above.  Physical Exam  Blood pressure 120/79, pulse 78, temperature 97.5 F (36.4 C), temperature source Oral, resp. rate 20, height 5\' 7"  (1.702 m), weight 262 lb 8 oz (119.069 kg), SpO2 98 %.  General: Pleasant, NAD Psych: Normal affect. Neuro: Alert and oriented X 3. Moves all extremities spontaneously. HEENT: Normal  Neck: Supple without bruits or JVD. Lungs:  Resp regular and unlabored, CTA. Heart: RRR no s3, s4, or murmurs. Abdomen: Soft, non-tender, non-distended, BS + x 4.  Extremities: No clubbing, cyanosis. DP/PT/Radials 2+ and equal bilaterally. 2+ pitting edema  Labs   Recent Labs  05/19/16 1131 05/19/16 1502 05/19/16 2037 05/20/16 0308  TROPONINI 0.08* 0.11*  0.12* 0.14*   Lab Results  Component Value Date   WBC 10.4 05/20/2016   HGB 11.8* 05/20/2016   HCT 36.7* 05/20/2016   MCV 94.3 05/20/2016   PLT 206 05/20/2016    Recent Labs Lab 05/19/16 1131 05/20/16 0308  NA 140 138  K 4.4 4.2  CL 106 102  CO2 25 26  BUN 18 24*  CREATININE 1.21 1.24  CALCIUM 9.3 9.2  PROT 7.5  --   BILITOT 1.1  --   ALKPHOS 52  --   ALT 54  --   AST 53*  --   GLUCOSE 158* 146*   Lab Results  Component Value Date   CHOL 159 05/26/2015   HDL 35* 05/26/2015   LDLCALC 111* 05/26/2015   TRIG 66 05/26/2015   Lab Results  Component Value Date   DDIMER 1.05* 05/25/2015    Radiology/Studies  Dg Chest Portable 1 View  05/19/2016  CLINICAL DATA:  Shortness of breath EXAM: PORTABLE CHEST 1 VIEW COMPARISON:  01/22/2016 FINDINGS: Left AICD remains in place, unchanged. There is cardiomegaly. Vascular congestion noted diffuse interstitial prominence may reflect interstitial edema. No effusions or acute bony abnormality. IMPRESSION: Cardiomegaly with interstitial opacities, likely mild edema. Electronically Signed   By: Charlett Nose M.D.   On: 05/19/2016 11:55    ECG  Sinus tach with   ASSESSMENT AND PLAN  1. Acute on chronic  combined systolic and diastolic heart failure  - I/O -4.6L Weight down from 265 to 262. His lung is clear, he still has 2+ pitting edema on exam.  - he is only mildly fluid overloaded at this point, will consider IV lasix for another 24-48 hours, expect discharge after that  - despite mildly elevated trop, he has no CP, given his h/o NICM and lack of symptom, I do not feel strongly about myoview  - pending echo. Restart home long acting nitrate  2. Recurrent atrial fibrillation on ASA  - had severe hematuria on Xarelto.   - This patients CHA2DS2-VASc Score and unadjusted Ischemic Stroke Rate (% per year) is equal to 7.2 % stroke rate/year from a score of 5  Above score calculated as 1 point each if present [CHF, HTN, DM, Vascular=MI/PAD/Aortic Plaque, Age if 65-74, or Male] Above score calculated as 2 points each if present [Age > 75, or Stroke/TIA/TE]  - recurrence of afib in Feb 2017  3. ICM s/p single lead Biotronik ICD at [facility] 10/2015  4. h/o PE: none on repeat CTA in 2016 5. HTN: not on ACEI due to angioedema 6. HLD 7. CKD stage III 8. Positive nitrite on UA: per primary team  Signed, Azalee Course, PA-C 05/20/2016, 2:12 PM As above, patient seen and examined. Briefly he is a 82 year old male with past medical history of nonischemic cardiomyopathy, paroxysmal atrial fibrillation, prior ICD, hypertension, hyperlipidemia for evaluation of acute on chronic systolic congestive heart failure. Patient developed pedal edema 3 days prior to admission. He developed dyspnea on the morning of admission. He denies chest pain or syncope. Chest x-ray shows mild edema. Troponin 0.14. Electrocardiogram shows sinus tachycardia with nonspecific ST changes. 1 acute on chronic systolic congestive heart failure-agree with Lasix 80 mg IV twice a day and follow renal function. Continue spironolactone. Continue carvedilol. Continue hydralazine and add nitrates. He has had angioedema with ACE inhibitor  previously and we will avoid.  2 paroxysmal atrial fibrillation-noted in the past and an electrocardiogram from May appears to show this as well. Presently in sinus  rhythm. Continue beta blocker. We discussed anticoagulation but he declined. He apparently had severe hematuria with xarelto in the past. He understands the higher risk of stroke off of anticoagulation. Continue aspirin. CHADSvasc - 5 3 elevated troponin- minimally elevated but no clear trend and not consistent with an acute coronary syndrome. No further ischemia evaluation. Olga Millers

## 2016-05-20 NOTE — Progress Notes (Signed)
  Echocardiogram 2D Echocardiogram has been performed.  Ryan Macdonald 05/20/2016, 3:50 PM

## 2016-05-21 ENCOUNTER — Other Ambulatory Visit: Payer: Self-pay

## 2016-05-21 ENCOUNTER — Other Ambulatory Visit: Payer: Self-pay | Admitting: Physician Assistant

## 2016-05-21 DIAGNOSIS — N183 Chronic kidney disease, stage 3 unspecified: Secondary | ICD-10-CM

## 2016-05-21 LAB — GLUCOSE, CAPILLARY
GLUCOSE-CAPILLARY: 123 mg/dL — AB (ref 65–99)
Glucose-Capillary: 121 mg/dL — ABNORMAL HIGH (ref 65–99)

## 2016-05-21 LAB — BASIC METABOLIC PANEL
ANION GAP: 8 (ref 5–15)
BUN: 27 mg/dL — ABNORMAL HIGH (ref 6–20)
CALCIUM: 9.3 mg/dL (ref 8.9–10.3)
CHLORIDE: 97 mmol/L — AB (ref 101–111)
CO2: 33 mmol/L — AB (ref 22–32)
Creatinine, Ser: 1.37 mg/dL — ABNORMAL HIGH (ref 0.61–1.24)
GFR calc non Af Amer: 47 mL/min — ABNORMAL LOW (ref >60)
GFR, EST AFRICAN AMERICAN: 55 mL/min — AB (ref >60)
Glucose, Bld: 136 mg/dL — ABNORMAL HIGH (ref 65–99)
Potassium: 3.5 mmol/L (ref 3.5–5.1)
SODIUM: 138 mmol/L (ref 135–145)

## 2016-05-21 LAB — MAGNESIUM: MAGNESIUM: 1.9 mg/dL (ref 1.7–2.4)

## 2016-05-21 MED ORDER — FUROSEMIDE 40 MG PO TABS: 60.0000 mg | ORAL_TABLET | Freq: Two times a day (BID) | ORAL | Status: DC

## 2016-05-21 MED ORDER — MAGNESIUM SULFATE IN D5W 1-5 GM/100ML-% IV SOLN
1.0000 g | Freq: Once | INTRAVENOUS | Status: DC
  Filled 2016-05-21: qty 100

## 2016-05-21 MED ORDER — FUROSEMIDE 80 MG PO TABS
80.0000 mg | ORAL_TABLET | Freq: Two times a day (BID) | ORAL | Status: DC
Start: 2016-05-22 — End: 2016-05-21

## 2016-05-21 NOTE — Progress Notes (Signed)
Patient Name: Ryan Macdonald Date of Encounter: 05/21/2016  Primary Cardiologist: Dr. Duke Salvia   Pt. Profile  Mr. Grewe is a pleasant 82 year old African-American male with past medical history of chronic systolic and diastolic heart failure with baseline EF 15-20%, NICM s/p Biotronik ICD 10/2015, h/o PAF no longer on Xarelto due to severe hematuria, h/o PE, HTN, HLD, CKD stage III and NSVT presented with SOB and treated for acute on chronic combined HF and found to have elevated trop   Principal Problem:   Acute on chronic systolic heart failure (HCC) Active Problems:   Hyperlipidemia   CKD (chronic kidney disease)   ICD (implantable cardioverter-defibrillator) in place   Respiratory distress   Heart failure (HCC)   Depression   Prolonged Q-T interval on ECG   Gout   Acute on chronic respiratory failure with hypoxia (HCC)    SUBJECTIVE  Feeling good overnight. Denies any CP or SOB.    CURRENT MEDS . allopurinol  200 mg Oral Daily  . aspirin EC  81 mg Oral Daily  . atorvastatin  40 mg Oral q1800  . carvedilol  12.5 mg Oral BID WC  . colchicine  0.6 mg Oral Daily  . enoxaparin (LOVENOX) injection  40 mg Subcutaneous Q24H  . finasteride  5 mg Oral Daily  . furosemide  80 mg Intravenous Q12H  . hydrALAZINE  10 mg Intravenous Once  . hydrALAZINE  25 mg Oral TID  . insulin aspart  0-15 Units Subcutaneous TID WC  . isosorbide dinitrate  40 mg Oral TID  . sertraline  50 mg Oral Daily  . sodium chloride flush  3 mL Intravenous Q12H  . spironolactone  25 mg Oral Daily  . tamsulosin  0.4 mg Oral QHS  . vitamin B-12  1,000 mcg Oral Daily    OBJECTIVE  Filed Vitals:   05/21/16 0107 05/21/16 0550 05/21/16 0555 05/21/16 0636  BP: 109/54 100/58  116/61  Pulse: 89 85  79  Temp:  98.6 F (37 C)    TempSrc:  Oral    Resp: 16 18    Height:      Weight:   256 lb 12.8 oz (116.484 kg)   SpO2: 95% 95%      Intake/Output Summary (Last 24 hours) at 05/21/16 0946 Last  data filed at 05/21/16 0841  Gross per 24 hour  Intake    640 ml  Output   3225 ml  Net  -2585 ml   Filed Weights   05/19/16 1856 05/20/16 0514 05/21/16 0555  Weight: 265 lb 1.6 oz (120.249 kg) 262 lb 8 oz (119.069 kg) 256 lb 12.8 oz (116.484 kg)    PHYSICAL EXAM  General: Pleasant, NAD. Neuro: Alert and oriented X 3. Moves all extremities spontaneously. Psych: Normal affect. HEENT:  Normal  Neck: Supple without bruits or JVD. Lungs:  Resp regular and unlabored, CTA. Heart: no s3, s4, or murmurs. Difficult to hear heart beat, irregular.  Abdomen: Soft, non-tender, non-distended, BS + x 4.  Extremities: No clubbing, cyanosis. DP/PT/Radials 2+ and equal bilaterally. 1-2+ pitting edema in LLE  Accessory Clinical Findings  CBC  Recent Labs  05/19/16 1131 05/20/16 0308  WBC 15.3* 10.4  NEUTROABS 8.9*  --   HGB 13.3 11.8*  HCT 41.3 36.7*  MCV 99.0 94.3  PLT 214 206   Basic Metabolic Panel  Recent Labs  05/20/16 0308 05/20/16 0842 05/21/16 0148  NA 138  --  138  K 4.2  --  3.5  CL 102  --  97*  CO2 26  --  33*  GLUCOSE 146*  --  136*  BUN 24*  --  27*  CREATININE 1.24  --  1.37*  CALCIUM 9.2  --  9.3  MG  --  1.5* 1.9   Liver Function Tests  Recent Labs  05/19/16 1131  AST 53*  ALT 54  ALKPHOS 52  BILITOT 1.1  PROT 7.5  ALBUMIN 3.5   Cardiac Enzymes  Recent Labs  05/19/16 1502 05/19/16 2037 05/20/16 0308  TROPONINI 0.11* 0.12* 0.14*   Hemoglobin A1C  Recent Labs  05/19/16 1618  HGBA1C 6.4*   Thyroid Function Tests  Recent Labs  05/19/16 1618  TSH 1.508    TELE Unclear if current in afib, does have irregular RR interval    ECG  pending  Echocardiogram 05/20/2016  LV EF: 25% - 30%  ------------------------------------------------------------------- Indications: Dyspnea 786.09.  ------------------------------------------------------------------- History: Risk factors: Paroxysmal A-fib. Chronic kidney  disease. ICD in place. Hypertension. Dyslipidemia.  ------------------------------------------------------------------- Study Conclusions  - Left ventricle: The cavity size was moderately dilated. Wall  thickness was increased in a pattern of mild LVH. Systolic  function was severely reduced. The estimated ejection fraction  was in the range of 25% to 30%. Diffuse hypokinesis. Doppler  parameters are consistent with both elevated ventricular  end-diastolic filling pressure and elevated left atrial filling  pressure. - Left atrium: The atrium was mildly dilated. - Atrial septum: No defect or patent foramen ovale was identified.    Radiology/Studies  Dg Chest Portable 1 View  05/19/2016  CLINICAL DATA:  Shortness of breath EXAM: PORTABLE CHEST 1 VIEW COMPARISON:  01/22/2016 FINDINGS: Left AICD remains in place, unchanged. There is cardiomegaly. Vascular congestion noted diffuse interstitial prominence may reflect interstitial edema. No effusions or acute bony abnormality. IMPRESSION: Cardiomegaly with interstitial opacities, likely mild edema. Electronically Signed   By: Charlett Nose M.D.   On: 05/19/2016 11:55    ASSESSMENT AND PLAN  1. Acute on chronic combined systolic and diastolic heart failure - continue nitrate/hydralazine, coreg, and spironoloactone. Had h/o angioedema on ACEI  - Echo 05/20/2016 EF 25-30%. MIldly improved compare to previous Echo showing EF 15-20%  2. Recurrent atrial fibrillation on ASA - had severe hematuria on Xarelto.  - This patients CHA2DS2-VASc Score and unadjusted Ischemic Stroke Rate (% per year) is equal to 7.2 % stroke rate/year from a score of 5  Above score calculated as 1 point each if present [CHF, HTN, DM, Vascular=MI/PAD/Aortic Plaque, Age if 65-74, or Male] Above score calculated as 2 points each if present [Age > 75, or Stroke/TIA/TE] - recurrence of afib in Feb 2017  - telemetry  looks like afib, will recheck EKG  3. ICM s/p single lead Biotronik ICD at [facility] hospital 10/2015  4. h/o PE: none on repeat CTA in 2016 5. HTN: not on ACEI due to angioedema 6. HLD 7. CKD stage III: Cr trended up slightly 8. Positive nitrite on UA: per primary team  Signed, Azalee Course PA-C Pager: [phone] As above, patient seen and examined. He denies dyspnea and no chest pain. His edema has resolved. Will change Lasix to 60 mg twice a day. There is question atrial fibrillation intermittently on telemetry. Continue beta blocker. His rate is controlled and no symptoms. He refuses anticoagulation because of history of hematuria.He understands the higher risk of stroke.Continue aspirin. He can be discharged from a cardiac standpoint. Check potassium and renal function early next week. Schedule transition of care  appointment with assistant in 1 week. Follow-up with Dr. Duke Salvia 4-6 weeks. Olga Millers

## 2016-05-21 NOTE — Discharge Instructions (Signed)
Furosemide tablets °What is this medicine? °FUROSEMIDE (fyoor [place] se mide) is a diuretic. It helps you make more urine and to lose salt and excess water from your body. This medicine is used to treat high blood pressure, and edema or swelling from heart, kidney, or liver disease. °This medicine may be used for other purposes; ask your health care provider or pharmacist if you have questions. °What should I tell my health care provider before I take this medicine? °They need to know if you have any of these conditions: °-abnormal blood electrolytes °-diarrhea or vomiting °-gout °-heart disease °-kidney disease, small amounts of urine, or difficulty passing urine °-liver disease °-thyroid disease °-an unusual or allergic reaction to furosemide, sulfa drugs, other medicines, foods, dyes, or preservatives °-pregnant or trying to get pregnant °-breast-feeding °How should I use this medicine? °Take this medicine by mouth with a glass of water. Follow the directions on the prescription label. You may take this medicine with or without food. If it upsets your stomach, take it with food or milk. Do not take your medicine more often than directed. Remember that you will need to pass more urine after taking this medicine. Do not take your medicine at a time of day that will cause you problems. Do not take at bedtime. °Talk to your pediatrician regarding the use of this medicine in children. While this drug may be prescribed for selected conditions, precautions do apply. °Overdosage: If you think you have taken too much of this medicine contact a poison control center or emergency room at once. °NOTE: This medicine is only for you. Do not share this medicine with others. °What if I miss a dose? °If you miss a dose, take it as soon as you can. If it is almost time for your next dose, take only that dose. Do not take double or extra doses. °What may interact with this medicine? °-aspirin and aspirin-like medicines °-certain  antibiotics °-chloral hydrate °-cisplatin °-cyclosporine °-digoxin °-diuretics °-laxatives °-lithium °-medicines for blood pressure °-medicines that relax muscles for surgery °-methotrexate °-NSAIDs, medicines for pain and inflammation like ibuprofen, naproxen, or indomethacin °-phenytoin °-steroid medicines like prednisone or cortisone °-sucralfate °-thyroid hormones °This list may not describe all possible interactions. Give your health care provider a list of all the medicines, herbs, non-prescription drugs, or dietary supplements you use. Also tell them if you smoke, drink alcohol, or use illegal drugs. Some items may interact with your medicine. °What should I watch for while using this medicine? °Visit your doctor or health care professional for regular checks on your progress. Check your blood pressure regularly. Ask your doctor or health care professional what your blood pressure should be, and when you should contact him or her. If you are a diabetic, check your blood sugar as directed. °You may need to be on a special diet while taking this medicine. Check with your doctor. Also, ask how many glasses of fluid you need to drink a day. You must not get dehydrated. °You may get drowsy or dizzy. Do not drive, use machinery, or do anything that needs mental alertness until you know how this drug affects you. Do not stand or sit up quickly, especially if you are an older patient. This reduces the risk of dizzy or fainting spells. Alcohol can make you more drowsy and dizzy. Avoid alcoholic drinks. °This medicine can make you more sensitive to the sun. Keep out of the sun. If you cannot avoid being in the sun, wear protective clothing and   use sunscreen. Do not use sun lamps or tanning beds/booths. °What side effects may I notice from receiving this medicine? °Side effects that you should report to your doctor or health care professional as soon as possible: °-blood in urine or stools °-dry mouth °-fever or  chills °-hearing loss or ringing in the ears °-irregular heartbeat °-muscle pain or weakness, cramps °-skin rash °-stomach upset, pain, or nausea °-tingling or numbness in the hands or feet °-unusually weak or tired °-vomiting or diarrhea °-yellowing of the eyes or skin °Side effects that usually do not require medical attention (report to your doctor or health care professional if they continue or are bothersome): °-headache °-loss of appetite °-unusual bleeding or bruising °This list may not describe all possible side effects. Call your doctor for medical advice about side effects. You may report side effects to FDA at 1-800-FDA-1088. °Where should I keep my medicine? °Keep out of the reach of children. °Store at room temperature between 15 and 30 degrees C (59 and 86 degrees F). Protect from light. Throw away any unused medicine after the expiration date. °NOTE: This sheet is a summary. It may not cover all possible information. If you have questions about this medicine, talk to your doctor, pharmacist, or health care provider. °  °© 2016, [employer]. (2015-02-05 13:49:50) ° °

## 2016-05-21 NOTE — Care Management Note (Signed)
Case Management Note  Patient Details  Name: Ryan Macdonald MRN: [id number] Date of Birth: 02-14-1934  Subjective/Objective:   Admitted with CHF                 Action/Plan: Patient is independent prior to admission, lives with spouse. Has private insurance with the [place] in [place]; at discharge he request that his medication be faxed to the [place] and they will mail order his medication to him the next day. PCP is Dr Loma Sender at the [facility]; No DME at this time; No needs identified at this time.  Expected Discharge Date:    05/21/2016              Expected Discharge Plan:  Home/Self Care  Discharge planning Services  CM Consult Choice offered to:  Patient    Status of Service:  Completed, signed off  [name] [phone] 05/21/2016, 10:19 AM

## 2016-05-21 NOTE — Progress Notes (Signed)
Pt has orders to be discharged. Discharge instructions given and pt has no additional questions at this time. Medication regimen reviewed and pt educated. Pt verbalized understanding and has no additional questions. Discharge summary and prescription faxed to [facility]. Telemetry box removed. IV removed and site in good condition. Pt stable and waiting for transportation.

## 2016-05-21 NOTE — Discharge Summary (Addendum)
Physician Discharge Summary  Ryan Macdonald ZOX:[id number] DOB: 07-23-1934 DOA: 05/19/2016  PCP: [facility]  Admit date: 05/19/2016 Discharge date: 05/21/2016  Recommendations for Outpatient Follow-up:  1. Please recheck kidney function early next week to make sure it is stable. Creatinine is 1.37 prior to discharge.  D/C faxed to [facility] at [phone] , pt PCP at [facility]  Discharge Diagnoses:  Principal Problem:   Acute on chronic systolic heart failure (HCC) Active Problems:   Hyperlipidemia   CKD (chronic kidney disease)   ICD (implantable cardioverter-defibrillator) in place   Respiratory distress   Heart failure (HCC)   Depression   Prolonged Q-T interval on ECG   Gout   Acute on chronic respiratory failure with hypoxia (HCC)    Discharge Condition: stable   Diet recommendation: as tolerated   History of present illness:  82 y.o. male with past medical history significant for chronic systolic and diastolic congestive heart failure with last 2-D echo in 2016 with ejection fraction of 20%, defibrillator placement, further history of essential hypertension, diabetes on glipizide, depression, dyslipidemia who presented to [facility] with worsening shortness of breath started the morning prior to this admission, at rest. Patient reports associated lower extremity swelling about week prior to this admission and he took extra dose of Lasix but no significant symptomatic relief. He doesn't think he gained any weight. He follows in [facility].  Patient was hemodynamically stable on the admission. His BNP was slightly elevated, 243. Blood work showed slight leukocytosis of 15.3. Troponin level was 0.11, 0.12 and 0.14. Chest x-ray showed cardiomegaly with interstitial opacities likely mild edema. He was started on IV Lasix.   Hospital Course:    Assessment & Plan:  Principal Problem:  Acute on chronic systolic and diastolic heart failure,  NYHA IV (HCC) / ICD (implantable cardioverter-defibrillator) in place - Based on severity of the symptoms he is acutely decompensated CHF is in NYHA IV category - Last 2-D echo in 2016 with ejection fraction of 20%. 2-D echo on this admission with ejection fraction 25-30%, overall stable - Patient was seen by cardiology, recommend continuing Lasix on discharge - Patient declined to be on anticoagulation because of history of hematuria - Continue carvedilol and spironolactone  Active Problems:  Dyslipidemia associated with Dr. diabetes mellitus - Continue Lipitor 40 mg at bedtime   Elevated troponin - Likely demand ischemia from acute decompensated CHF - No reports of chest pain - As noted above, patient declined to be on anticoagulation because of previous history of hematuria    Controlled type 2 diabetes mellitus without complications without long-term insulin use - Continue glipizide - A1c on this admission at goal   Essential hypertension - Continue carvedilol, hydralazine    Depression - Continue Zoloft   DVT prophylaxis: Lovenox subcutaneous Code Status: full code  Family Communication: No family at the bedside    Consultants:   Cardiology  Procedures:   2  D ECHO 05/20/2016 - EF 25%  Antimicrobials:   None   Signed:  Manson Passey, MD  [facility] 05/21/2016, 11:42 AM  Pager #: [phone]  Time spent in minutes: less than 30 minutes    Discharge Exam: Filed Vitals:   05/21/16 0550 05/21/16 0636  BP: 100/58 116/61  Pulse: 85 79  Temp: 98.6 F (37 C)   Resp: 18    Filed Vitals:   05/21/16 0107 05/21/16 0550 05/21/16 0555 05/21/16 0636  BP: 109/54 100/58  116/61  Pulse: 89 85  79  Temp:  98.6 F (37 C)    TempSrc:  Oral    Resp: 16 18    Height:      Weight:   116.484 kg (256 lb 12.8 oz)   SpO2: 95% 95%      General: Pt is alert, follows commands appropriately, not in acute distress Cardiovascular: Regular rate and  rhythm, S1/S2 +, no murmurs Respiratory: Clear to auscultation bilaterally, no wheezing, no crackles, no rhonchi Abdominal: Soft, non tender, non distended, bowel sounds +, no guarding Extremities: no edema, no cyanosis, pulses palpable bilaterally DP and PT Neuro: Grossly nonfocal  Discharge Instructions  Discharge Instructions    Call MD for:  difficulty breathing, headache or visual disturbances    Complete by:  As directed      Call MD for:  persistant dizziness or light-headedness    Complete by:  As directed      Call MD for:  persistant nausea and vomiting    Complete by:  As directed      Call MD for:  severe uncontrolled pain    Complete by:  As directed      Diet - low sodium heart healthy    Complete by:  As directed      Increase activity slowly    Complete by:  As directed             Medication List    STOP taking these medications        predniSONE 10 MG tablet  Commonly known as:  DELTASONE      TAKE these medications        allopurinol 100 MG tablet  Commonly known as:  ZYLOPRIM  Take 200 mg by mouth daily.     aspirin EC 81 MG tablet  Take 81 mg by mouth daily.     carvedilol 12.5 MG tablet  Commonly known as:  COREG  Take 12.5 mg by mouth 2 (two) times daily with a meal.     colchicine 0.6 MG tablet  Take 1 tablet (0.6 mg total) by mouth daily.     cyanocobalamin 1000 MCG tablet  Take 1,000 mcg by mouth daily.     diphenhydrAMINE 25 MG tablet  Commonly known as:  BENADRYL  Take 25 mg by mouth every 8 (eight) hours as needed for sleep.     finasteride 5 MG tablet  Commonly known as:  PROSCAR  Take 5 mg by mouth daily.     furosemide 40 MG tablet  Commonly known as:  LASIX  Take 1 tablet (40 mg total) by mouth daily.     glipiZIDE 5 MG tablet  Commonly known as:  GLUCOTROL  Take 5 mg by mouth daily before breakfast.     hydrALAZINE 25 MG tablet  Commonly known as:  APRESOLINE  Take 25 mg by mouth 3 (three) times daily.      HYDROcodone-acetaminophen 5-325 MG tablet  Commonly known as:  NORCO/VICODIN  Take 2 tablets by mouth every 4 (four) hours as needed.     isosorbide dinitrate 40 MG CR capsule  Commonly known as:  DILATRATE-SR  Take 1 capsule (40 mg total) by mouth every 12 (twelve) hours.     ketoconazole 2 % shampoo  Commonly known as:  NIZORAL  Apply 1 application topically 2 (two) times a week.     sertraline 25 MG tablet  Commonly known as:  ZOLOFT  Take 50 mg by mouth daily.  simvastatin 80 MG tablet  Commonly known as:  ZOCOR  Take 40 mg by mouth daily.     spironolactone 25 MG tablet  Commonly known as:  ALDACTONE  Take 0.5 tablets (12.5 mg total) by mouth daily.     tamsulosin 0.4 MG Caps capsule  Commonly known as:  FLOMAX  Take 0.4 mg by mouth at bedtime.           Follow-up Information    Follow up with [facility]. Schedule an appointment as soon as possible for a visit in 2 weeks.   Specialty:  General Practice   Why:  Follow up appt after recent hospitalization   Contact information:   [address]  [facility] [address] [phone]        The results of significant diagnostics from this hospitalization (including imaging, microbiology, ancillary and laboratory) are listed below for reference.    Significant Diagnostic Studies: Dg Chest Portable 1 View  05/19/2016  CLINICAL DATA:  Shortness of breath EXAM: PORTABLE CHEST 1 VIEW COMPARISON:  01/22/2016 FINDINGS: Left AICD remains in place, unchanged. There is cardiomegaly. Vascular congestion noted diffuse interstitial prominence may reflect interstitial edema. No effusions or acute bony abnormality. IMPRESSION: Cardiomegaly with interstitial opacities, likely mild edema. Electronically Signed   By: Charlett Nose M.D.   On: 05/19/2016 11:55    Microbiology: No results found for this or any previous visit (from the past 240 hour(s)).   Labs: Basic Metabolic Panel:  Recent Labs Lab 05/19/16 1131  05/19/16 2037 05/20/16 0308 05/20/16 0842 05/21/16 0148  NA 140  --  138  --  138  K 4.4  --  4.2  --  3.5  CL 106  --  102  --  97*  CO2 25  --  26  --  33*  GLUCOSE 158*  --  146*  --  136*  BUN 18  --  24*  --  27*  CREATININE 1.21  --  1.24  --  1.37*  CALCIUM 9.3  --  9.2  --  9.3  MG  --  1.5*  --  1.5* 1.9   Liver Function Tests:  Recent Labs Lab 05/19/16 1131  AST 53*  ALT 54  ALKPHOS 52  BILITOT 1.1  PROT 7.5  ALBUMIN 3.5   No results for input(s): LIPASE, AMYLASE in the last 168 hours. No results for input(s): AMMONIA in the last 168 hours. CBC:  Recent Labs Lab 05/19/16 1131 05/20/16 0308  WBC 15.3* 10.4  NEUTROABS 8.9*  --   HGB 13.3 11.8*  HCT 41.3 36.7*  MCV 99.0 94.3  PLT 214 206   Cardiac Enzymes:  Recent Labs Lab 05/19/16 1131 05/19/16 1502 05/19/16 2037 05/20/16 0308  TROPONINI 0.08* 0.11* 0.12* 0.14*   BNP: BNP (last 3 results)  Recent Labs  08/21/15 0722 01/22/16 2358 05/19/16 1131  BNP 219.6* 119.2* 243.9*    ProBNP (last 3 results) No results for input(s): PROBNP in the last 8760 hours.  CBG:  Recent Labs Lab 05/20/16 0608 05/20/16 1204 05/20/16 1651 05/20/16 2130 05/21/16 0552  GLUCAP 225* 118* 153* 129* 123*

## 2016-05-27 NOTE — Progress Notes (Signed)
Cardiology Office Note    Date:  05/28/2016   ID:  Ryan Macdonald, DOB 1934/11/21, MRN [id number]  PCP:  [facility]  Cardiologist:  Dr. Duke Salvia  CC: post hosp f/u  History of Present Illness:  Ryan Macdonald is a 82 y.o. male with a history of chronic systolic and diastolic heart failure with baseline EF 15-20%, NICM s/p Biotronik ICD 10/2015, h/o PAF no longer on Xarelto due to severe hematuria, h/o PE, HTN, HLD, CKD stage III and NSVT who presents to clinic for post hospital f/u after a recent admission for A/C CHF.   Patient had history of cardiac catheterization in 2009 in [place] which showed clean coronaries. Record is not available at this time, and the patient cannot recall which hospital it was but states it was not [facility] [place]. He had episodes of paroxysmal atrial fibrillation in the past, however due to severe severe hematuria, he is no longer taking Xarelto. He is currently on aspirin only. CTA of the chest on 05/25/2015 showed no evidence of PE, staghorn calculus in the right kidney. His last echocardiogram obtained in December 2016 showed EF 15-20%. [facility] Since his Biotronik ICD implantation, he has been doing well. He denies any recent fever, chill, cough. He does have occasional lower extremity edema and a taken additional dose of Lasix for it. He is baseline dry weight is between 247 pounds - 255 pounds. He says he measured his weight daily, avoid salt, and limit to less than 2 L fluid intake per day.   Recently admitted from 6/21-6/23/17 for acute on chronic CHF and UTI. His troponin as mildly elevated with flat trend which was felt to be c/w demand ischemia. Echo 05/20/2016 EF 25-30% which was mIldly improved compare to previous Echo showing EF 15-20%  He was diuresed with IV lasix and then transitioned to PO lasix 60mg  BID (increased from home lasix 40mg  BID). Discharge weight 256 lbs.   Today he  presents to clinic for follow up. He has been feeling great since leaving the hospital. No CP or SOB. No LE edema, orthopnea or PND. No dizziness or syncope. No blood in his stool or urine. Ready to start fishing again.     Past Medical History  Diagnosis Date  . Nonischemic cardiomyopathy (HCC)     a. ? 2009 Cath in [place] - nl cors per pt;  b. 09/2013 Echo: EF 25-30%, sev diff HK.  Marland Kitchen Hypertension   . Cat scratch fever     removed mass on right side neck  . Cataracts, bilateral   . High cholesterol   . Osteoarthritis   . Kidney stones   . Morbid obesity (HCC)   . PAF (paroxysmal atrial fibrillation) (HCC)     a. post-op hip in 2014 - prev on xarelto.  . CHF (congestive heart failure) (HCC)   . Shortness of breath dyspnea   . Diabetes mellitus without complication (HCC)     TYPE 2    Past Surgical History  Procedure Laterality Date  . Kidney stone surgery Right 1970's; 1983    "twice"  . Tonsillectomy and adenoidectomy  1940's  . Cystoscopy with urethral dilatation  10/23/2013  . Total hip arthroplasty Left 10/23/2013  . Inguinal hernia repair Right 1950's  . Circumcision  1940's  . Incision and drainage of wound Right 1980's    "cat scratch" (10/23/2013)  . Cardiac catheterization  1980's  . Total  hip arthroplasty Left 10/23/2013    Procedure: TOTAL HIP ARTHROPLASTY;  Surgeon: Ryan Batman, MD;  Location: [facility] OR;  Service: Orthopedics;  Laterality: Left;  . Cystoscopy with urethral dilatation N/A 10/23/2013    Procedure: CYSTOSCOPY WITH URETHRAL DILATATION;  Surgeon: Kathi Ludwig, MD;  Location: [facility] OR;  Service: Urology;  Laterality: N/A;    Current Medications: Outpatient Prescriptions Prior to Visit  Medication Sig Dispense Refill  . allopurinol (ZYLOPRIM) 100 MG tablet Take 200 mg by mouth daily.    Marland Kitchen aspirin EC 81 MG tablet Take 81 mg by mouth daily.    . carvedilol (COREG) 12.5 MG tablet Take 12.5 mg by mouth 2 (two) times daily with a meal.    .  cyanocobalamin 1000 MCG tablet Take 1,000 mcg by mouth daily.     . diphenhydrAMINE (BENADRYL) 25 mg capsule Take 25 mg by mouth at bedtime as needed for sleep.    . finasteride (PROSCAR) 5 MG tablet Take 5 mg by mouth daily.    . furosemide (LASIX) 40 MG tablet Take 1.5 tablets (60 mg total) by mouth 2 (two) times daily. 90 tablet 5  . glipiZIDE (GLUCOTROL) 5 MG tablet Take 5 mg by mouth daily before breakfast.    . hydrALAZINE (APRESOLINE) 25 MG tablet Take 25 mg by mouth 3 (three) times daily.    Marland Kitchen HYDROcodone-acetaminophen (NORCO/VICODIN) 5-325 MG tablet Take 2 tablets by mouth every 4 (four) hours as needed. 6 tablet 0  . ketoconazole (NIZORAL) 2 % shampoo Apply 1 application topically 2 (two) times a week.    . sertraline (ZOLOFT) 25 MG tablet Take 50 mg by mouth daily.    . simvastatin (ZOCOR) 80 MG tablet Take 40 mg by mouth daily.    . tamsulosin (FLOMAX) 0.4 MG CAPS capsule Take 0.4 mg by mouth at bedtime.    . diphenhydrAMINE (BENADRYL) 25 MG tablet Take 25 mg by mouth every 8 (eight) hours as needed for sleep.    . colchicine 0.6 MG tablet Take 1 tablet (0.6 mg total) by mouth daily. 10 tablet 0  . isosorbide dinitrate (DILATRATE-SR) 40 MG CR capsule Take 1 capsule (40 mg total) by mouth every 12 (twelve) hours. (Patient not taking: Reported on 05/28/2016) 60 capsule 11  . spironolactone (ALDACTONE) 25 MG tablet Take 0.5 tablets (12.5 mg total) by mouth daily. (Patient not taking: Reported on 05/28/2016)     No facility-administered medications prior to visit.     Allergies:   Lisinopril   Social History   Social History  . Marital Status: Married    Spouse Name: N/A  . Number of Children: N/A  . Years of Education: N/A   Social History Main Topics  . Smoking status: Former Smoker -- 1.00 packs/day for 15 years    Types: Cigarettes, Cigars  . Smokeless tobacco: Former Neurosurgeon     Comment: quit smoking ~ 40 yr ago  . Alcohol Use: No     Comment: rare beer.  . Drug Use: No    . Sexual Activity: Yes   Other Topics Concern  . None   Social History Narrative   Lives in [place] with wife.  Does not routinely exercise.     Family History:  The patient's family history includes Heart Problems in his maternal grandmother.     ROS:   Please see the history of present illness.    ROS All other systems reviewed and are negative.   PHYSICAL EXAM:   VS:  BP 98/60 mmHg  Pulse 94  Ht  (1.702 m)  Wt 258 lb 6.4 oz (117.209 kg)  BMI 40.46 kg/m2  SpO2 98%   GEN: Well nourished, well developed, in no acute distressobese HEENT: normal Neck: no JVD, carotid bruits, or masses Cardiac: RRR; no murmurs, rubs, or gallops,no edema  Respiratory:  clear to auscultation bilaterally, normal work of breathing GI: soft, nontender, nondistended, + BS MS: no deformity or atrophy Skin: warm and dry, no rash Neuro:  Alert and Oriented x 3, Strength and sensation are intact Psych: euthymic mood, full affect  Wt Readings from Last 3 Encounters:  05/28/16 258 lb 6.4 oz (117.209 kg)  05/21/16 256 lb 12.8 oz (116.484 kg)  01/22/16 260 lb (117.935 kg)      Studies/Labs Reviewed:   EKG:  EKG is ordered today.  The ekg ordered today demonstrates sinus with PAC vs afib? HR 95  Recent Labs: 05/19/2016: ALT 54; B Natriuretic Peptide 243.9*; TSH 1.508 05/20/2016: Hemoglobin 11.8*; Platelets 206 05/21/2016: BUN 27*; Creatinine, Ser 1.37*; Magnesium 1.9; Potassium 3.5; Sodium 138   Lipid Panel    Component Value Date/Time   CHOL 159 05/26/2015 0409   TRIG 66 05/26/2015 0409   HDL 35* 05/26/2015 0409   CHOLHDL 4.5 05/26/2015 0409   VLDL 13 05/26/2015 0409   LDLCALC 111* 05/26/2015 0409    Additional studies/ records that were reviewed today include:  Echocardiogram 05/20/2016  LV EF: 25% - 30%   Study Conclusions - Left ventricle: The cavity size was moderately dilated. Wall  thickness was increased in a pattern of mild LVH. Systolic  function was severely  reduced. The estimated ejection fraction  was in the range of 25% to 30%. Diffuse hypokinesis. Doppler  parameters are consistent with both elevated ventricular  end-diastolic filling pressure and elevated left atrial filling  pressure. - Left atrium: The atrium was mildly dilated. - Atrial septum: No defect or patent foramen ovale was identified.          ASSESSMENT & PLAN:   Chronic combined systolic and diastolic heart failure - continue nitrate/hydralazine, coreg, and spironoloactone. Had h/o angioedema on ACEI - Echo 05/20/2016 EF 25-30%. MIldly improved compare to previous Echo showing EF 15-20% - discharged on lasix  BID. Will repeat BMET today, if creat elevated with decrease lasix back down to  BID  Recurrent atrial fibrillation on ASA - patient refuses OAC because he had severe hematuria on Xarelto.  - This patients CHA2DS2-VASc Score of 5. He understands his stoke risk and is willing to accept this.  - recurrence of afib in Feb 2017 and on most recent admission. ECG with possible Afib vs sinus with PACs today. Review with Dr. Sanjuana Kava. HR 95 and patient refuses Xarelto.   Hx of PE: none on repeat CTA in 2016   HTN: not on ACEI due to angioedema. BP well controlled on current regiemn  HLD: continue statin   CKD stage III: Cr at discharge 1.37. Will repeat BMET today, if creat elevated with decrease lasix back down to  BID    Medication Adjustments/Labs and Tests Ordered: Current medicines are reviewed at length with the patient today.  Concerns regarding medicines are outlined above.  Medication changes, Labs and Tests ordered today are listed in the Patient Instructions below. Patient Instructions  Medication Instructions:  Your physician recommends that you continue on your current medications as directed. Please refer to the Current Medication list given to you today.   Labwork: TODAY:  BMET  Testing/Procedures: None  ordered  Follow-Up: Your physician recommends that you schedule a follow-up appointment in: 3 MONTHS WITH DR.     Any Other Special Instructions Will Be Listed Below (If Applicable).   If you need a refill on your cardiac medications before your next appointment, please call your pharmacy.       Signed, Cline Crock, PA-C  05/28/2016 10:12 AM    [facility] [address] [place], [place], [place]  [address] Phone: [phone]; Fax: [phone]

## 2016-05-28 ENCOUNTER — Ambulatory Visit (INDEPENDENT_AMBULATORY_CARE_PROVIDER_SITE_OTHER): Payer: Non-veteran care | Admitting: Pharmacist

## 2016-05-28 ENCOUNTER — Ambulatory Visit (INDEPENDENT_AMBULATORY_CARE_PROVIDER_SITE_OTHER): Payer: No Typology Code available for payment source | Admitting: Physician Assistant

## 2016-05-28 ENCOUNTER — Encounter: Payer: Self-pay | Admitting: Physician Assistant

## 2016-05-28 VITALS — BP 98/60 | HR 94

## 2016-05-28 VITALS — BP 98/60 | HR 94 | Ht 67.0 in | Wt 258.4 lb

## 2016-05-28 DIAGNOSIS — I429 Cardiomyopathy, unspecified: Secondary | ICD-10-CM

## 2016-05-28 DIAGNOSIS — I428 Other cardiomyopathies: Secondary | ICD-10-CM

## 2016-05-28 DIAGNOSIS — I5023 Acute on chronic systolic (congestive) heart failure: Secondary | ICD-10-CM

## 2016-05-28 DIAGNOSIS — I4891 Unspecified atrial fibrillation: Secondary | ICD-10-CM

## 2016-05-28 DIAGNOSIS — I10 Essential (primary) hypertension: Secondary | ICD-10-CM

## 2016-05-28 DIAGNOSIS — I5043 Acute on chronic combined systolic (congestive) and diastolic (congestive) heart failure: Secondary | ICD-10-CM | POA: Diagnosis not present

## 2016-05-28 LAB — BASIC METABOLIC PANEL
BUN: 31 mg/dL — ABNORMAL HIGH (ref 7–25)
CALCIUM: 9.3 mg/dL (ref 8.6–10.3)
CO2: 25 mmol/L (ref 20–31)
Chloride: 101 mmol/L (ref 98–110)
Creat: 1.68 mg/dL — ABNORMAL HIGH (ref 0.70–1.18)
Glucose, Bld: 127 mg/dL — ABNORMAL HIGH (ref 65–99)
POTASSIUM: 3.5 mmol/L (ref 3.5–5.3)
SODIUM: 138 mmol/L (ref 135–146)

## 2016-05-28 NOTE — Progress Notes (Signed)
Patient ID: [name]                 DOB: Mar 31, 1934                      MRN: [id number]    Pharmacy Transitions of Care Visit  HPI: Ryan Macdonald is a 82 y.o. male admitted to [facility] from 6/21-6/23 with primary diagnosis of CHF exacerbation. PMH is significant for chronic systolic and diastolic CHF (EF 10%), HTN, DM, HLD, hx of PAF (no anticoagulation due to hematuria) and depression.  He presented to the hospital with increased SOB at rest.  He took extra lasix at home with no relief.  He was admitted for IV diuresis.  Patient presents to clinic for pharmacy transitions of care medication reconciliation after hospital discharge.  Pt has been doing well since discharge.  He is doing his normal activities such as fishing and running errands.  He does not have any SOB at rest.  Weight stable.  He is a retired Public house manager.    All medications have been reviewed with patient.  Labs reviewed.  SCr- 1.37 at discharge (1.21 on admission), K-3.5, A1c- 6.4  Issues/concerns noted are as follows: 1.  Pt has a history of Afib and has refused anticoagulation due to history of severe hematuria on Xarelto.  2.  Pt not on ACE-I or ARB due to angioedema with lisinopril   Past Medical History  Diagnosis Date  . Nonischemic cardiomyopathy (HCC)     a. ? 2009 Cath in [place] - nl cors per pt;  b. 09/2013 Echo: EF 25-30%, sev diff HK.  Marland Kitchen Hypertension   . Cat scratch fever     removed mass on right side neck  . Cataracts, bilateral   . High cholesterol   . Osteoarthritis   . Kidney stones   . Morbid obesity (HCC)   . PAF (paroxysmal atrial fibrillation) (HCC)     a. post-op hip in 2014 - prev on xarelto.  . CHF (congestive heart failure) (HCC)   . Shortness of breath dyspnea   . Diabetes mellitus without complication (HCC)     TYPE 2    Current Outpatient Prescriptions on File Prior to Visit  Medication Sig Dispense Refill  . allopurinol (ZYLOPRIM) 100 MG tablet Take 200 mg by mouth daily.      Marland Kitchen aspirin EC 81 MG tablet Take 81 mg by mouth daily.    . carvedilol (COREG) 12.5 MG tablet Take 12.5 mg by mouth 2 (two) times daily with a meal.    . colchicine 0.6 MG tablet Take 1 tablet (0.6 mg total) by mouth daily. 10 tablet 0  . cyanocobalamin 1000 MCG tablet Take 1,000 mcg by mouth daily.     . diphenhydrAMINE (BENADRYL) 25 MG tablet Take 25 mg by mouth every 8 (eight) hours as needed for sleep.    . finasteride (PROSCAR) 5 MG tablet Take 5 mg by mouth daily.    . furosemide (LASIX) 40 MG tablet Take 1.5 tablets (60 mg total) by mouth 2 (two) times daily. 90 tablet 5  . glipiZIDE (GLUCOTROL) 5 MG tablet Take 5 mg by mouth daily before breakfast.    . hydrALAZINE (APRESOLINE) 25 MG tablet Take 25 mg by mouth 3 (three) times daily.    Marland Kitchen HYDROcodone-acetaminophen (NORCO/VICODIN) 5-325 MG tablet Take 2 tablets by mouth every 4 (four) hours as needed. 6 tablet 0  . isosorbide dinitrate (DILATRATE-SR) 40 MG CR capsule  Take 1 capsule (40 mg total) by mouth every 12 (twelve) hours. (Patient taking differently: Take 40 mg by mouth 3 (three) times daily. ) 60 capsule 11  . ketoconazole (NIZORAL) 2 % shampoo Apply 1 application topically 2 (two) times a week.    . sertraline (ZOLOFT) 25 MG tablet Take 50 mg by mouth daily.    . simvastatin (ZOCOR) 80 MG tablet Take 40 mg by mouth daily.    Marland Kitchen spironolactone (ALDACTONE) 25 MG tablet Take 0.5 tablets (12.5 mg total) by mouth daily. (Patient taking differently: Take 25 mg by mouth daily. )    . tamsulosin (FLOMAX) 0.4 MG CAPS capsule Take 0.4 mg by mouth at bedtime.     No current facility-administered medications on file prior to visit.    Allergies  Allergen Reactions  . Lisinopril Swelling    Assessment/Plan:  1. CHF- Pt doing well since discharge.  He is on guideline recommended therapy of beta-blocker, hydralazine/isosorbide combo (angioedema with ACE-I), spironolactone, and furosemide.  His symptoms have improved.  Needs BMET today to  recheck SCr but otherwise stable with medications.

## 2016-05-28 NOTE — Patient Instructions (Signed)
Medication Instructions:  Your physician recommends that you continue on your current medications as directed. Please refer to the Current Medication list given to you today.   Labwork: TODAY:  BMET  Testing/Procedures: None ordered  Follow-Up: Your physician recommends that you schedule a follow-up appointment in: 3 MONTHS WITH DR. [place]    Any Other Special Instructions Will Be Listed Below (If Applicable).   If you need a refill on your cardiac medications before your next appointment, please call your pharmacy.

## 2016-05-31 ENCOUNTER — Other Ambulatory Visit: Payer: Self-pay | Admitting: *Deleted

## 2016-05-31 MED ORDER — FUROSEMIDE 40 MG PO TABS: 40.0000 mg | ORAL_TABLET | Freq: Two times a day (BID) | ORAL | Status: DC

## 2016-08-05 ENCOUNTER — Emergency Department (HOSPITAL_COMMUNITY): Payer: Non-veteran care

## 2016-08-05 ENCOUNTER — Encounter (HOSPITAL_COMMUNITY): Payer: Self-pay

## 2016-08-05 ENCOUNTER — Emergency Department (HOSPITAL_COMMUNITY)
Admission: EM | Admit: 2016-08-05 | Discharge: 2016-08-05 | Disposition: A | Discharge status: Home / Self Care | Payer: Non-veteran care | Attending: Emergency Medicine | Admitting: Emergency Medicine

## 2016-08-05 DIAGNOSIS — Z95810 Presence of automatic (implantable) cardiac defibrillator: Secondary | ICD-10-CM | POA: Insufficient documentation

## 2016-08-05 DIAGNOSIS — Z7984 Long term (current) use of oral hypoglycemic drugs: Secondary | ICD-10-CM | POA: Diagnosis not present

## 2016-08-05 DIAGNOSIS — I130 Hypertensive heart and chronic kidney disease with heart failure and stage 1 through stage 4 chronic kidney disease, or unspecified chronic kidney disease: Secondary | ICD-10-CM | POA: Insufficient documentation

## 2016-08-05 DIAGNOSIS — R0600 Dyspnea, unspecified: Secondary | ICD-10-CM | POA: Diagnosis not present

## 2016-08-05 DIAGNOSIS — Z87891 Personal history of nicotine dependence: Secondary | ICD-10-CM | POA: Diagnosis not present

## 2016-08-05 DIAGNOSIS — I5023 Acute on chronic systolic (congestive) heart failure: Secondary | ICD-10-CM | POA: Insufficient documentation

## 2016-08-05 DIAGNOSIS — Z7982 Long term (current) use of aspirin: Secondary | ICD-10-CM | POA: Diagnosis not present

## 2016-08-05 DIAGNOSIS — E1122 Type 2 diabetes mellitus with diabetic chronic kidney disease: Secondary | ICD-10-CM | POA: Insufficient documentation

## 2016-08-05 DIAGNOSIS — R0602 Shortness of breath: Secondary | ICD-10-CM | POA: Diagnosis present

## 2016-08-05 DIAGNOSIS — N189 Chronic kidney disease, unspecified: Secondary | ICD-10-CM | POA: Diagnosis not present

## 2016-08-05 DIAGNOSIS — Z96642 Presence of left artificial hip joint: Secondary | ICD-10-CM | POA: Diagnosis not present

## 2016-08-05 LAB — COMPREHENSIVE METABOLIC PANEL
ALT: 32 U/L (ref 17–63)
AST: 36 U/L (ref 15–41)
Albumin: 3.4 g/dL — ABNORMAL LOW (ref 3.5–5.0)
Alkaline Phosphatase: 50 U/L (ref 38–126)
Anion gap: 11 (ref 5–15)
BUN: 26 mg/dL — ABNORMAL HIGH (ref 6–20)
CHLORIDE: 101 mmol/L (ref 101–111)
CO2: 26 mmol/L (ref 22–32)
Calcium: 10.3 mg/dL (ref 8.9–10.3)
Creatinine, Ser: 1.69 mg/dL — ABNORMAL HIGH (ref 0.61–1.24)
GFR, EST AFRICAN AMERICAN: 42 mL/min — AB (ref >60)
GFR, EST NON AFRICAN AMERICAN: 37 mL/min — AB (ref >60)
Glucose, Bld: 180 mg/dL — ABNORMAL HIGH (ref 65–99)
POTASSIUM: 4.4 mmol/L (ref 3.5–5.1)
SODIUM: 138 mmol/L (ref 135–145)
Total Bilirubin: 0.7 mg/dL (ref 0.3–1.2)
Total Protein: 7.3 g/dL (ref 6.5–8.1)

## 2016-08-05 LAB — CBC
HCT: 40.4 % (ref 39.0–52.0)
HEMOGLOBIN: 13 g/dL (ref 13.0–17.0)
MCH: 31.4 pg (ref 26.0–34.0)
MCHC: 32.2 g/dL (ref 30.0–36.0)
MCV: 97.6 fL (ref 78.0–100.0)
Platelets: 250 10*3/uL (ref 150–400)
RBC: 4.14 MIL/uL — ABNORMAL LOW (ref 4.22–5.81)
RDW: 14.1 % (ref 11.5–15.5)
WBC: 8.1 10*3/uL (ref 4.0–10.5)

## 2016-08-05 LAB — I-STAT TROPONIN, ED
TROPONIN I, POC: 0.06 ng/mL (ref 0.00–0.08)
Troponin i, poc: 0.06 ng/mL (ref 0.00–0.08)

## 2016-08-05 LAB — BRAIN NATRIURETIC PEPTIDE: B NATRIURETIC PEPTIDE 5: 84.3 pg/mL (ref 0.0–100.0)

## 2016-08-05 LAB — D-DIMER, QUANTITATIVE (NOT AT ARMC): D DIMER QUANT: 0.78 ug{FEU}/mL — AB (ref 0.00–0.50)

## 2016-08-05 MED ORDER — IOPAMIDOL (ISOVUE-370) INJECTION 76%
INTRAVENOUS | Status: AC
  Administered 2016-08-05: 100 mL
  Filled 2016-08-05: qty 100

## 2016-08-05 MED ORDER — HYDROCODONE-ACETAMINOPHEN 5-325 MG PO TABS
1.0000 | ORAL_TABLET | ORAL | Status: AC
  Administered 2016-08-05: 1 via ORAL
  Filled 2016-08-05: qty 1

## 2016-08-05 NOTE — ED Provider Notes (Signed)
[facility]-EMERGENCY DEPT Provider Note   CSN: [id number] Arrival date & time: 08/05/16  0907     History   Chief Complaint Chief Complaint  Patient presents with  . Shortness of Breath    HPI Ryan Macdonald is a 82 y.o. male.  The history is provided by the patient.  Shortness of Breath  This is a new problem. Episode onset: this am. Progression since onset: Pt states just a "little bit" Associated symptoms include cough (mild). Pertinent negatives include no fever, no rhinorrhea, no PND, no orthopnea, no chest pain, no vomiting and no leg swelling. Associated symptoms comments: He also noticed that his BP was low this am when he took it . He has tried nothing for the symptoms. The treatment provided no relief. Associated medical issues include heart failure. Associated medical issues do not include COPD or PE.  No recent medication changes  Past Medical History:  Diagnosis Date  . Cat scratch fever    removed mass on right side neck  . Cataracts, bilateral   . CHF (congestive heart failure) (HCC)   . Diabetes mellitus without complication (HCC)    TYPE 2  . High cholesterol   . Hypertension   . Kidney stones   . Morbid obesity (HCC)   . Nonischemic cardiomyopathy (HCC)    a. ? 2009 Cath in [place] - nl cors per pt;  b. 09/2013 Echo: EF 25-30%, sev diff HK.  . Osteoarthritis   . PAF (paroxysmal atrial fibrillation) (HCC)    a. post-op hip in 2014 - prev on xarelto.  . Shortness of breath dyspnea     Patient Active Problem List   Diagnosis Date Noted  . Acute on chronic respiratory failure with hypoxia (HCC)   . Respiratory distress 05/19/2016  . Heart failure (HCC) 05/19/2016  . Depression 05/19/2016  . Prolonged Q-T interval on ECG 05/19/2016  . Gout 05/19/2016  . 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 Pneumonia   . NSVT (nonsustained ventricular tachycardia) (HCC)   . CKD (chronic kidney disease)   . HCAP (healthcare-associated pneumonia)  08/21/2015  . Acute on chronic systolic heart failure (HCC) 08/21/2015  . Hyperkalemia 08/21/2015  . PE (pulmonary embolism)   . Acute respiratory failure (HCC)   . Hypoxia 05/22/2015  . Acute respiratory failure, unspecified whether with hypoxia or hypercapnia (HCC)   . Osteoarthritis of left hip 10/25/2013  . Atrial fibrillation (HCC) 10/25/2013  . S/P hip replacement 10/24/2013  . Nonischemic cardiomyopathy (HCC)   . Obesity (BMI 30-39.9)   . Essential hypertension   . Hyperlipidemia     Past Surgical History:  Procedure Laterality Date  . CARDIAC CATHETERIZATION  1980's  . CIRCUMCISION  1940's  . CYSTOSCOPY WITH URETHRAL DILATATION  10/23/2013  . CYSTOSCOPY WITH URETHRAL DILATATION N/A 10/23/2013   Procedure: CYSTOSCOPY WITH URETHRAL DILATATION;  Surgeon: Kathi Ludwig, MD;  Location: [facility] OR;  Service: Urology;  Laterality: N/A;  . INCISION AND DRAINAGE OF WOUND Right 1980's   "cat scratch" (10/23/2013)  . INGUINAL HERNIA REPAIR Right 1950's  . KIDNEY STONE SURGERY Right 1970's; 1983   "twice"  . TONSILLECTOMY AND ADENOIDECTOMY  1940's  . TOTAL HIP ARTHROPLASTY Left 10/23/2013  . TOTAL HIP ARTHROPLASTY Left 10/23/2013   Procedure: TOTAL HIP ARTHROPLASTY;  Surgeon: Valeria Batman, MD;  Location[facility] OR;  Service: Orthopedics;  Laterality: Left;       Home Medications    Prior to Admission medications   Medication Sig Start Date  End Date Taking? Authorizing Provider  allopurinol (ZYLOPRIM) 100 MG tablet Take 200 mg by mouth daily.   Yes Historical Provider, MD  aspirin EC 81 MG tablet Take 81 mg by mouth daily.   Yes Historical Provider, MD  carvedilol (COREG) 12.5 MG tablet Take 12.5 mg by mouth 2 (two) times daily with a meal.   Yes Historical Provider, MD  cyanocobalamin 1000 MCG tablet Take 1,000 mcg by mouth daily.    Yes Historical Provider, MD  diphenhydrAMINE (BENADRYL) 25 mg capsule Take 25 mg by mouth at bedtime as needed for sleep.   Yes Historical  Provider, MD  finasteride (PROSCAR) 5 MG tablet Take 5 mg by mouth daily.   Yes Historical Provider, MD  furosemide (LASIX) 40 MG tablet Take 1 tablet (40 mg total) by mouth 2 (two) times daily. 05/31/16  Yes Janetta Hora, PA-C  glipiZIDE (GLUCOTROL) 5 MG tablet Take 5 mg by mouth daily before breakfast.   Yes Historical Provider, MD  hydrALAZINE (APRESOLINE) 25 MG tablet Take 25 mg by mouth 3 (three) times daily.   Yes Historical Provider, MD  HYDROcodone-acetaminophen (NORCO/VICODIN) 5-325 MG tablet Take 2 tablets by mouth every 4 (four) hours as needed. 04/09/16  Yes Isa Rankin, MD  isosorbide dinitrate (DILATRATE-SR) 40 MG CR capsule Take 40 mg by mouth 3 (three) times daily.   Yes Historical Provider, MD  ketoconazole (NIZORAL) 2 % shampoo Apply 1 application topically 2 (two) times a week.   Yes Historical Provider, MD  sertraline (ZOLOFT) 25 MG tablet Take 50 mg by mouth daily.   Yes Historical Provider, MD  simvastatin (ZOCOR) 80 MG tablet Take 40 mg by mouth daily.   Yes Historical Provider, MD  spironolactone (ALDACTONE) 25 MG tablet Take 25 mg by mouth daily.   Yes Historical Provider, MD  tamsulosin (FLOMAX) 0.4 MG CAPS capsule Take 0.4 mg by mouth at bedtime.   Yes Historical Provider, MD  colchicine 0.6 MG tablet Take 1 tablet (0.6 mg total) by mouth daily. 04/09/16 05/19/16  Isa Rankin, MD    Family History Family History  Problem Relation Age of Onset  . Heart Problems Maternal Grandmother   . Other      negative for premature CAD    Social History Social History  Substance Use Topics  . Smoking status: Former Smoker    Packs/day: 1.00    Years: 15.00    Types: Cigarettes, Cigars  . Smokeless tobacco: Former Neurosurgeon     Comment: quit smoking ~ 40 yr ago  . Alcohol use No     Comment: rare beer.     Allergies   Lisinopril   Review of Systems Review of Systems  Constitutional: Negative for fever.  HENT: Negative for rhinorrhea.   Respiratory: Positive for  cough (mild) and shortness of breath.   Cardiovascular: Negative for chest pain, orthopnea, leg swelling and PND.  Gastrointestinal: Negative for blood in stool, diarrhea and vomiting.     Physical Exam Updated Vital Signs BP 101/72   Pulse 80   Temp 98 F (36.7 C) (Oral)   Resp 23   SpO2 94%   Physical Exam  Constitutional: No distress.  HENT:  Head: Normocephalic and atraumatic.  Right Ear: External ear normal.  Left Ear: External ear normal.  Eyes: Conjunctivae are normal. Right eye exhibits no discharge. Left eye exhibits no discharge. No scleral icterus.  Neck: Neck supple. No tracheal deviation present.  Cardiovascular: Normal rate, regular rhythm and intact  distal pulses.   Pulmonary/Chest: Effort normal and breath sounds normal. No stridor. No respiratory distress. He has no wheezes. He has no rales.  Abdominal: Soft. Bowel sounds are normal. He exhibits no distension. There is no tenderness. There is no rebound and no guarding.  Musculoskeletal: He exhibits edema (mild bilateral lower extremity). He exhibits no tenderness.  Neurological: He is alert. He has normal strength. No cranial nerve deficit (no facial droop, extraocular movements intact, no slurred speech) or sensory deficit. He exhibits normal muscle tone. He displays no seizure activity. Coordination normal.  Skin: Skin is warm and dry. No rash noted. He is not diaphoretic.  Psychiatric: He has a normal mood and affect.  Nursing note and vitals reviewed.    ED Treatments / Results  Labs (all labs ordered are listed, but only abnormal results are displayed) Labs Reviewed  CBC - Abnormal; Notable for the following:       Result Value   RBC 4.14 (*)    All other components within normal limits  COMPREHENSIVE METABOLIC PANEL - Abnormal; Notable for the following:    Glucose, Bld 180 (*)    BUN 26 (*)    Creatinine, Ser 1.69 (*)    Albumin 3.4 (*)    GFR calc non Af Amer 37 (*)    GFR calc Af Amer 42 (*)     All other components within normal limits  D-DIMER, QUANTITATIVE (NOT AT [facility]) - Abnormal; Notable for the following:    D-Dimer, Quant 0.78 (*)    All other components within normal limits  BRAIN NATRIURETIC PEPTIDE  I-STAT TROPOININ, ED  I-STAT TROPOININ, ED    EKG  EKG Interpretation  Date/Time:  Thursday August 05 2016 09:16:07 EDT Ventricular Rate:  89 PR Interval:    QRS Duration: 94 QT Interval:  372 QTC Calculation: 452 R Axis:   13 Text Interpretation:  Sinus rhythm with sinus arrhythmia with 1st degree A-V block T wave abnormality, consider lateral ischemia Abnormal ECG anterolateral t wave changes on prior ECG not present today Confirmed by Layna Roeper  MD-J, Falicia Lizotte ([id number]) on 08/05/2016 11:06:22 AM       Radiology Dg Chest 2 View  Result Date: 08/05/2016 CLINICAL DATA:  Short of breath EXAM: CHEST  2 VIEW COMPARISON:  05/19/2016 FINDINGS: AICD lead in the right ventricle unchanged. Negative for heart failure. Lungs are clear without infiltrate or effusion. IMPRESSION: No active cardiopulmonary disease. Electronically Signed   By: Marlan Palauharles  Clark M.D.   On: 08/05/2016 10:48   Ct Angio Chest Pe W And/or Wo Contrast  Result Date: 08/05/2016 CLINICAL DATA:  Pt. Reports having sob developed this am. Denies any chest pain. Skin is warm and dry. No distress noted in Triage. Pt. Has a cardiac hx. H/o HTN, diabetes, CHF EXAM: CT ANGIOGRAPHY CHEST WITH CONTRAST TECHNIQUE: Multidetector CT imaging of the chest was performed using the standard protocol during bolus administration of intravenous contrast. Multiplanar CT image reconstructions and MIPs were obtained to evaluate the vascular anatomy. CONTRAST:  100 mL of Isovue 370 intravenous contrast COMPARISON:  Current chest radiographs.  Chest CTA, 05/25/2015 FINDINGS: Angiographic study: No evidence of a pulmonary embolus. The great vessels are normal in caliber. No aortic dissection. Mild partly calcified atherosclerotic plaque noted along  the thoracic aorta. Neck base and axilla:  No mass or adenopathy. Mediastinum and hila: Heart is normal in size and configuration. There are moderate coronary artery calcifications. No mediastinal or hilar masses. Stable 12 mm short axis right  subcarinal lymph node. No other enlarged lymph nodes. Lungs and pleura: Clear lungs. No pleural effusion. No pneumothorax. Limited upper abdomen: No acute findings. Partly imaged calcification in the upper pole the right kidney. Musculoskeletal: Degenerative changes noted throughout the visualized spine. No osteoblastic or osteolytic lesions. Review of the MIP images confirms the above findings. IMPRESSION: 1. No evidence of a pulmonary embolism. 2. No acute findings.  Lungs are clear. Electronically Signed   By: Amie Portland M.D.   On: 08/05/2016 14:44    Procedures Procedures (including critical care time)  Medications Ordered in ED Medications  HYDROcodone-acetaminophen (NORCO/VICODIN) 5-325 MG per tablet 1 tablet (1 tablet Oral Given 08/05/16 1319)  iopamidol (ISOVUE-370) 76 % injection (100 mLs  Contrast Given 08/05/16 1417)     Initial Impression / Assessment and Plan / ED Course  I have reviewed the triage vital signs and the nursing notes.  Pertinent labs & imaging results that were available during my care of the patient were reviewed by me and considered in my medical decision making (see chart for details).  Clinical Course    Patient presents to the emergency room with complaints of dyspnea earlier. The symptoms have improved without any intervention. Patient does not have any history of coronary artery disease but does have history of nonischemic cardiomyopathy. The patient's evaluation in the emergency room is reassuring. There is no evidence of pulmonary edema. Chest x-ray does not show pneumonia. Cardiac enzymes are reassuring. His symptoms may be related to his chronic medical conditions however do not see any signs of an acute exacerbation.  Plan on discharge home with close outpatient follow-up.  Final Clinical Impressions(s) / ED Diagnoses   Final diagnoses:  Dyspnea    New Prescriptions New Prescriptions   No medications on file     Linwood Dibbles, MD 08/05/16 1517

## 2016-08-05 NOTE — ED Notes (Signed)
Pt given sprite zero and crackers.  

## 2016-08-05 NOTE — ED Notes (Signed)
Pt pulled up in bed and repositioned/ co of back pain

## 2016-08-05 NOTE — ED Notes (Signed)
Back from ct angio placed back on monitor

## 2016-08-05 NOTE — Discharge Instructions (Signed)
Please follow up with year doctor at the [place]. Return as needed for worsening symptoms.

## 2016-08-05 NOTE — ED Notes (Signed)
Wife back in room, explained we are wainting on 3 more labs that dr ordered and that I had already drawn them and he did not have to be restuck

## 2016-08-05 NOTE — ED Notes (Signed)
To x-ray

## 2016-08-05 NOTE — ED Triage Notes (Signed)
Pt. Reports having sob developed this am.   Denies any chest pain.  Skin is warm and dry.  No distress noted in Triage.   Pt. Has a cardiac hx.

## 2016-08-10 ENCOUNTER — Ambulatory Visit (INDEPENDENT_AMBULATORY_CARE_PROVIDER_SITE_OTHER): Payer: No Typology Code available for payment source | Admitting: Podiatry

## 2016-08-10 ENCOUNTER — Encounter: Payer: Self-pay | Admitting: Podiatry

## 2016-08-10 DIAGNOSIS — L84 Corns and callosities: Secondary | ICD-10-CM

## 2016-08-10 DIAGNOSIS — B351 Tinea unguium: Secondary | ICD-10-CM | POA: Diagnosis not present

## 2016-08-10 DIAGNOSIS — M79675 Pain in left toe(s): Secondary | ICD-10-CM | POA: Diagnosis not present

## 2016-08-10 DIAGNOSIS — M79674 Pain in right toe(s): Secondary | ICD-10-CM | POA: Diagnosis not present

## 2016-08-10 NOTE — Patient Instructions (Signed)
Diabetes and Foot Care Diabetes may cause you to have problems because of poor blood supply (circulation) to your feet and legs. This may cause the skin on your feet to become thinner, break easier, and heal more slowly. Your skin may become dry, and the skin may peel and crack. You may also have nerve damage in your legs and feet causing decreased feeling in them. You may not notice minor injuries to your feet that could lead to infections or more serious problems. Taking care of your feet is one of the most important things you can do for yourself.  HOME CARE INSTRUCTIONS  Wear shoes at all times, even in the house. Do not go barefoot. Bare feet are easily injured.  Check your feet daily for blisters, cuts, and redness. If you cannot see the bottom of your feet, use a mirror or ask someone for help.  Wash your feet with warm water (do not use hot water) and mild soap. Then pat your feet and the areas between your toes until they are completely dry. Do not soak your feet as this can dry your skin.  Apply a moisturizing lotion or petroleum jelly (that does not contain alcohol and is unscented) to the skin on your feet and to dry, brittle toenails. Do not apply lotion between your toes.  Trim your toenails straight across. Do not dig under them or around the cuticle. File the edges of your nails with an emery board or nail file.  Do not cut corns or calluses or try to remove them with medicine.  Wear clean socks or stockings every day. Make sure they are not too tight. Do not wear knee-high stockings since they may decrease blood flow to your legs.  Wear shoes that fit properly and have enough cushioning. To break in new shoes, wear them for just a few hours a day. This prevents you from injuring your feet. Always look in your shoes before you put them on to be sure there are no objects inside.  Do not cross your legs. This may decrease the blood flow to your feet.  If you find a minor scrape,  cut, or break in the skin on your feet, keep it and the skin around it clean and dry. These areas may be cleansed with mild soap and water. Do not cleanse the area with peroxide, alcohol, or iodine.  When you remove an adhesive bandage, be sure not to damage the skin around it.  If you have a wound, look at it several times a day to make sure it is healing.  Do not use heating pads or hot water bottles. They may burn your skin. If you have lost feeling in your feet or legs, you may not know it is happening until it is too late.  Make sure your health care provider performs a complete foot exam at least annually or more often if you have foot problems. Report any cuts, sores, or bruises to your health care provider immediately. SEEK MEDICAL CARE IF:   You have an injury that is not healing.  You have cuts or breaks in the skin.  You have an ingrown nail.  You notice redness on your legs or feet.  You feel burning or tingling in your legs or feet.  You have pain or cramps in your legs and feet.  Your legs or feet are numb.  Your feet always feel cold. SEEK IMMEDIATE MEDICAL CARE IF:   There is increasing redness,   swelling, or pain in or around a wound.  There is a red line that goes up your leg.  Pus is coming from a wound.  You develop a fever or as directed by your health care provider.  You notice a bad smell coming from an ulcer or wound.   This information is not intended to replace advice given to you by your health care provider. Make sure you discuss any questions you have with your health care provider.   Document Released: 11/12/2000 Document Revised: 07/18/2013 Document Reviewed: 04/24/2013 Elsevier Interactive Patient Education 2016 [employer]  

## 2016-08-10 NOTE — Progress Notes (Signed)
Patient ID: Ryan Macdonald, male   DOB: [birth year], 82 y.o.   MRN: [id number]   Subjective: This patient presents today for follow-up visit of 11/28/2015 complaining of nose and cough walking wearing shoes and a painful corn on the fifth right toe and requests treatment for both these areas. Patient was to return for follow-up visit of 11/28/2015, however, does not present today  Objective: DP and PT pulses 2/4 bilaterally Capillary reflex immediate bilaterally Vibratory sensation 24 intact bilaterally No foot skin lesions bilaterally Keratoses fourth right webspace Keratoses lateral fifth right toe The toenails are elongated, brittle, deformed 6-10 No deformities noted bilaterally  Assessment: Diabetic with satisfactory neurovascular status Symptomatic onychomycoses 6-10 Keratoses 1  Plan: Debrided toenails 6-10 mechanically and legs without anybleeding Debrided keratoses 2 without any bleeding Apply protective pad lateral fifth right toe  Reappoint 3 months

## 2016-09-10 ENCOUNTER — Encounter (HOSPITAL_COMMUNITY): Payer: Self-pay

## 2016-09-10 ENCOUNTER — Emergency Department (HOSPITAL_COMMUNITY)
Admission: EM | Admit: 2016-09-10 | Discharge: 2016-09-10 | Disposition: A | Discharge status: Home / Self Care | Payer: Non-veteran care | Attending: Emergency Medicine | Admitting: Emergency Medicine

## 2016-09-10 DIAGNOSIS — I130 Hypertensive heart and chronic kidney disease with heart failure and stage 1 through stage 4 chronic kidney disease, or unspecified chronic kidney disease: Secondary | ICD-10-CM | POA: Diagnosis not present

## 2016-09-10 DIAGNOSIS — N189 Chronic kidney disease, unspecified: Secondary | ICD-10-CM | POA: Insufficient documentation

## 2016-09-10 DIAGNOSIS — Z87891 Personal history of nicotine dependence: Secondary | ICD-10-CM | POA: Diagnosis not present

## 2016-09-10 DIAGNOSIS — Z7982 Long term (current) use of aspirin: Secondary | ICD-10-CM | POA: Insufficient documentation

## 2016-09-10 DIAGNOSIS — Z7984 Long term (current) use of oral hypoglycemic drugs: Secondary | ICD-10-CM | POA: Insufficient documentation

## 2016-09-10 DIAGNOSIS — Z96642 Presence of left artificial hip joint: Secondary | ICD-10-CM | POA: Diagnosis not present

## 2016-09-10 DIAGNOSIS — Z95810 Presence of automatic (implantable) cardiac defibrillator: Secondary | ICD-10-CM | POA: Insufficient documentation

## 2016-09-10 DIAGNOSIS — M545 Low back pain, unspecified: Secondary | ICD-10-CM

## 2016-09-10 DIAGNOSIS — I5023 Acute on chronic systolic (congestive) heart failure: Secondary | ICD-10-CM | POA: Diagnosis not present

## 2016-09-10 DIAGNOSIS — E1122 Type 2 diabetes mellitus with diabetic chronic kidney disease: Secondary | ICD-10-CM | POA: Diagnosis not present

## 2016-09-10 MED ORDER — BUPIVACAINE HCL (PF) 0.5 % IJ SOLN
10.0000 mL | Freq: Once | INTRAMUSCULAR | Status: AC
  Administered 2016-09-10: 10 mL
  Filled 2016-09-10: qty 10

## 2016-09-10 MED ORDER — OXYCODONE HCL 5 MG PO TABS: 5.0000 mg | ORAL_TABLET | Freq: Four times a day (QID) | ORAL | 0 refills | Status: DC | PRN

## 2016-09-10 NOTE — ED Notes (Signed)
Patient Alert and oriented X4. Stable and ambulatory. Patient verbalized understanding of the discharge instructions.  Patient belongings were taken by the patient.  

## 2016-09-10 NOTE — ED Triage Notes (Signed)
Pt brought in by EMS due to having back pain after bending over in the shower. Per EMS did ambulate to stretcher. Pt a&ox4.

## 2016-09-10 NOTE — ED Notes (Signed)
Hx chronic back pain. Reports worsening back pain yesterday after bending over in shower. Took prescribed percocet at home w/o relief of symptoms.

## 2016-09-10 NOTE — ED Provider Notes (Signed)
[facility]ENCY DEPT Provider Note   CSN: [id number] Arrival date & time: 09/10/16  1132     History   Chief Complaint Chief Complaint  Patient presents with  . Back Pain    HPI Ryan Macdonald is a 82 y.o. male.  The history is provided by the patient.  Back Pain   Chronicity: acute on chronic. The current episode started yesterday. The problem occurs constantly. The problem has not changed since onset.Associated with: sitting up from bent over position. The pain is present in the lumbar spine and gluteal region. The quality of the pain is described as aching. The pain does not radiate. The pain is moderate. The symptoms are aggravated by bending. Pertinent negatives include no fever, no numbness, no bowel incontinence, no bladder incontinence and no weakness. Treatments tried: hydrocodone. The treatment provided no relief. Risk factors include obesity and lack of exercise (multiple joint replacements and arthritis).    Past Medical History:  Diagnosis Date  . Cat scratch fever    removed mass on right side neck  . Cataracts, bilateral   . CHF (congestive heart failure) (HCC)   . Diabetes mellitus without complication (HCC)    TYPE 2  . High cholesterol   . Hypertension   . Kidney stones   . Morbid obesity (HCC)   . Nonischemic cardiomyopathy (HCC)    a. ? 2009 Cath in [place] - nl cors per pt;  b. 09/2013 Echo: EF 25-30%, sev diff HK.  . Osteoarthritis   . PAF (paroxysmal atrial fibrillation) (HCC)    a. post-op hip in 2014 - prev on xarelto.  . Shortness of breath dyspnea     Patient Active Problem List   Diagnosis Date Noted  . Acute on chronic respiratory failure with hypoxia (HCC)   . Respiratory distress 05/19/2016  . Heart failure (HCC) 05/19/2016  . Depression 05/19/2016  . Prolonged Q-T interval on ECG 05/19/2016  . Gout 05/19/2016  . 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 Pneumonia   . NSVT (nonsustained ventricular tachycardia)  (HCC)   . CKD (chronic kidney disease)   . HCAP (healthcare-associated pneumonia) 08/21/2015  . Acute on chronic systolic heart failure (HCC) 08/21/2015  . Hyperkalemia 08/21/2015  . PE (pulmonary embolism)   . Acute respiratory failure (HCC)   . Hypoxia 05/22/2015  . Acute respiratory failure, unspecified whether with hypoxia or hypercapnia (HCC)   . Osteoarthritis of left hip 10/25/2013  . Atrial fibrillation (HCC) 10/25/2013  . S/P hip replacement 10/24/2013  . Nonischemic cardiomyopathy (HCC)   . Obesity (BMI 30-39.9)   . Essential hypertension   . Hyperlipidemia     Past Surgical History:  Procedure Laterality Date  . CARDIAC CATHETERIZATION  1980's  . CIRCUMCISION  1940's  . CYSTOSCOPY WITH URETHRAL DILATATION  10/23/2013  . CYSTOSCOPY WITH URETHRAL DILATATION N/A 10/23/2013   Procedure: CYSTOSCOPY WITH URETHRAL DILATATION;  Surgeon: Kathi Ludwig, MD;  Location: [facility] OR;  Service: Urology;  Laterality: N/A;  . INCISION AND DRAINAGE OF WOUND Right 1980's   "cat scratch" (10/23/2013)  . INGUINAL HERNIA REPAIR Right 1950's  . KIDNEY STONE SURGERY Right 1970's; 1983   "twice"  . TONSILLECTOMY AND ADENOIDECTOMY  1940's  . TOTAL HIP ARTHROPLASTY Left 10/23/2013  . TOTAL HIP ARTHROPLASTY Left 10/23/2013   Procedure: TOTAL HIP ARTHROPLASTY;  Surgeon: Valeria Batman, MD;  Location: [facility] OR;  Service: Orthopedics;  Laterality: Left;       Home Medications    Prior to Admission  medications   Medication Sig Start Date End Date Taking? Authorizing Provider  allopurinol (ZYLOPRIM) 100 MG tablet Take 200 mg by mouth daily.   Yes Historical Provider, MD  aspirin EC 81 MG tablet Take 81 mg by mouth daily.   Yes Historical Provider, MD  carvedilol (COREG) 12.5 MG tablet Take 12.5 mg by mouth 2 (two) times daily with a meal.   Yes Historical Provider, MD  colchicine 0.6 MG tablet Take 1 tablet (0.6 mg total) by mouth daily. 04/09/16 09/10/16 Yes Isa RankinAnn B Smith, MD  cyanocobalamin  1000 MCG tablet Take 1,000 mcg by mouth daily.    Yes Historical Provider, MD  diphenhydrAMINE (BENADRYL) 25 mg capsule Take 25 mg by mouth at bedtime as needed for sleep.   Yes Historical Provider, MD  finasteride (PROSCAR) 5 MG tablet Take 5 mg by mouth daily.   Yes Historical Provider, MD  furosemide (LASIX) 40 MG tablet Take 1 tablet (40 mg total) by mouth 2 (two) times daily. 05/31/16  Yes Janetta HoraKathryn R Thompson, PA-C  glipiZIDE (GLUCOTROL) 5 MG tablet Take 5 mg by mouth daily before breakfast.   Yes Historical Provider, MD  hydrALAZINE (APRESOLINE) 25 MG tablet Take 25 mg by mouth 3 (three) times daily.   Yes Historical Provider, MD  HYDROcodone-acetaminophen (NORCO/VICODIN) 5-325 MG tablet Take 2 tablets by mouth every 4 (four) hours as needed. 04/09/16  Yes Isa RankinAnn B Smith, MD  isosorbide dinitrate (DILATRATE-SR) 40 MG CR capsule Take 40 mg by mouth 3 (three) times daily.   Yes Historical Provider, MD  ketoconazole (NIZORAL) 2 % shampoo Apply 1 application topically 2 (two) times a week.   Yes Historical Provider, MD  sertraline (ZOLOFT) 25 MG tablet Take 50 mg by mouth daily.   Yes Historical Provider, MD  simvastatin (ZOCOR) 80 MG tablet Take 40 mg by mouth daily.   Yes Historical Provider, MD  spironolactone (ALDACTONE) 25 MG tablet Take 25 mg by mouth daily.   Yes Historical Provider, MD  tamsulosin (FLOMAX) 0.4 MG CAPS capsule Take 0.4 mg by mouth at bedtime.   Yes Historical Provider, MD    Family History Family History  Problem Relation Age of Onset  . Heart Problems Maternal Grandmother   . Other      negative for premature CAD    Social History Social History  Substance Use Topics  . Smoking status: Former Smoker    Packs/day: 1.00    Years: 15.00    Types: Cigarettes, Cigars  . Smokeless tobacco: Former NeurosurgeonUser     Comment: quit smoking ~ 40 yr ago  . Alcohol use No     Comment: rare beer.     Allergies   Lisinopril   Review of Systems Review of Systems  Constitutional:  Negative for fever.  Gastrointestinal: Negative for bowel incontinence.  Genitourinary: Negative for bladder incontinence.  Musculoskeletal: Positive for back pain.  Neurological: Negative for weakness and numbness.  All other systems reviewed and are negative.    Physical Exam Updated Vital Signs BP 111/86   Pulse 84   Temp 98.1 F (36.7 C) (Oral)   Resp 16   SpO2 91%   Physical Exam  Constitutional: He is oriented to person, place, and time. He appears well-developed and well-nourished. No distress.  HENT:  Head: Normocephalic and atraumatic.  Nose: Nose normal.  Eyes: Conjunctivae are normal.  Neck: Neck supple. No tracheal deviation present.  Cardiovascular: Normal rate and regular rhythm.   Pulmonary/Chest: Effort normal. No respiratory distress.  Abdominal: Soft. He exhibits no distension.  Musculoskeletal:       Lumbar back: He exhibits tenderness and pain. He exhibits no deformity.       Back:  Neurological: He is alert and oriented to person, place, and time. He has normal strength. No sensory deficit. He exhibits normal muscle tone. Gait (antalgic gait) abnormal. Coordination normal.  Skin: Skin is warm and dry.  Psychiatric: He has a normal mood and affect.     ED Treatments / Results  Labs (all labs ordered are listed, but only abnormal results are displayed) Labs Reviewed - No data to display  EKG  EKG Interpretation None       Radiology No results found.  Procedures Procedures (including critical care time)  Procedure Note: Trigger Point Injection for Myofascial pain  Performed by Dr. Clydene Pugh Indication: muscle/myofascial pain Muscle body and tendon sheath of the left lumbar paraspinal muscle(s) were injected with 0.5% bupivacaine under sterile technique for release of muscle spasm/pain. Patient tolerated well with immediate improvement of symptoms and no immediate complications following procedure.  CPT Code:   1 or 2 muscle bodies: 20552    Medications Ordered in ED Medications  bupivacaine (MARCAINE) 0.5 % injection 10 mL (10 mLs Infiltration Given by Other 09/10/16 1451)     Initial Impression / Assessment and Plan / ED Course  I have reviewed the triage vital signs and the nursing notes.  Pertinent labs & imaging results that were available during my care of the patient were reviewed by me and considered in my medical decision making (see chart for details).  Clinical Course    82 y.o. male presents with left low back pain that flared when he went from bending forward to standing up straight. No radicular symptoms. No acute traumatic onset. No red flag symptoms of fever, weight loss, saddle anesthesia, weakness, fecal/urinary incontinence or urinary retention. Offered local trigger point injection for symptomatic treatment. Suspect MSK etiology. No indication for imaging emergently. Patient was recommended to engage in early mobility as definitive treatment. Provided short course of oxycodone fror breakthrough pain to help with mobility until he can obtain PCP f/u on Monday at the [place]. Return precautions discussed for worsening or new concerning symptoms.    Final Clinical Impressions(s) / ED Diagnoses   Final diagnoses:  Acute left-sided low back pain without sciatica    New Prescriptions Discharge Medication List as of 09/10/2016  3:23 PM    START taking these medications   Details  oxyCODONE (ROXICODONE) 5 MG immediate release tablet Take 1 tablet (5 mg total) by mouth every 6 (six) hours as needed for breakthrough pain., Starting Fri 09/10/2016, Print         Lyndal Pulley, MD 09/10/16 1739

## 2016-10-06 ENCOUNTER — Ambulatory Visit (INDEPENDENT_AMBULATORY_CARE_PROVIDER_SITE_OTHER): Payer: Non-veteran care | Admitting: Podiatry

## 2016-10-06 ENCOUNTER — Encounter: Payer: Self-pay | Admitting: Podiatry

## 2016-10-06 VITALS — BP 123/76 | HR 94 | Resp 18

## 2016-10-06 DIAGNOSIS — L84 Corns and callosities: Secondary | ICD-10-CM | POA: Diagnosis not present

## 2016-10-06 DIAGNOSIS — M79674 Pain in right toe(s): Secondary | ICD-10-CM | POA: Diagnosis not present

## 2016-10-06 DIAGNOSIS — M79675 Pain in left toe(s): Secondary | ICD-10-CM

## 2016-10-06 DIAGNOSIS — B351 Tinea unguium: Secondary | ICD-10-CM

## 2016-10-06 NOTE — Progress Notes (Signed)
Patient ID: Ryan Macdonald, male   DOB: February 05, 1934, 82 y.o.   MRSubjective: This patient presents today for follow-up visit of 11/28/2015 complaining of nose and cough walking wearing shoes and a painful corn on the fifth right toe and requests treatment for both these areas. Patient was to return for follow-up visit of 11/28/2015, however, does not present today  Objective: DP and PT pulses 2/4 bilaterally Capillary reflex immediate bilaterally Vibratory sensation 24 intact bilaterally Sensation to 10 g monofilament wire intact 5/5 bilaterally Ankle reflex equal and reactive bilaterally No open skin lesions bilaterally Keratoses lateral fifth right toe The toenails are elongated, brittle, deformed 6-10 HAV left  Assessment: Diabetic with satisfactory neurovascular status Symptomatic onychomycoses 6-10 Keratoses 1  Plan: Debrided toenails 6-10 mechanically and legs without anybleeding Debrided keratoses 1 without any bleeding   Reappoint 3 months

## 2016-10-06 NOTE — Patient Instructions (Signed)
Diabetes and Foot Care Diabetes may cause you to have problems because of poor blood supply (circulation) to your feet and legs. This may cause the skin on your feet to become thinner, break easier, and heal more slowly. Your skin may become dry, and the skin may peel and crack. You may also have nerve damage in your legs and feet causing decreased feeling in them. You may not notice minor injuries to your feet that could lead to infections or more serious problems. Taking care of your feet is one of the most important things you can do for yourself.  HOME CARE INSTRUCTIONS  Wear shoes at all times, even in the house. Do not go barefoot. Bare feet are easily injured.  Check your feet daily for blisters, cuts, and redness. If you cannot see the bottom of your feet, use a mirror or ask someone for help.  Wash your feet with warm water (do not use hot water) and mild soap. Then pat your feet and the areas between your toes until they are completely dry. Do not soak your feet as this can dry your skin.  Apply a moisturizing lotion or petroleum jelly (that does not contain alcohol and is unscented) to the skin on your feet and to dry, brittle toenails. Do not apply lotion between your toes.  Trim your toenails straight across. Do not dig under them or around the cuticle. File the edges of your nails with an emery board or nail file.  Do not cut corns or calluses or try to remove them with medicine.  Wear clean socks or stockings every day. Make sure they are not too tight. Do not wear knee-high stockings since they may decrease blood flow to your legs.  Wear shoes that fit properly and have enough cushioning. To break in new shoes, wear them for just a few hours a day. This prevents you from injuring your feet. Always look in your shoes before you put them on to be sure there are no objects inside.  Do not cross your legs. This may decrease the blood flow to your feet.  If you find a minor scrape,  cut, or break in the skin on your feet, keep it and the skin around it clean and dry. These areas may be cleansed with mild soap and water. Do not cleanse the area with peroxide, alcohol, or iodine.  When you remove an adhesive bandage, be sure not to damage the skin around it.  If you have a wound, look at it several times a day to make sure it is healing.  Do not use heating pads or hot water bottles. They may burn your skin. If you have lost feeling in your feet or legs, you may not know it is happening until it is too late.  Make sure your health care provider performs a complete foot exam at least annually or more often if you have foot problems. Report any cuts, sores, or bruises to your health care provider immediately. SEEK MEDICAL CARE IF:   You have an injury that is not healing.  You have cuts or breaks in the skin.  You have an ingrown nail.  You notice redness on your legs or feet.  You feel burning or tingling in your legs or feet.  You have pain or cramps in your legs and feet.  Your legs or feet are numb.  Your feet always feel cold. SEEK IMMEDIATE MEDICAL CARE IF:   There is increasing redness,   swelling, or pain in or around a wound.  There is a red line that goes up your leg.  Pus is coming from a wound.  You develop a fever or as directed by your health care provider.  You notice a bad smell coming from an ulcer or wound.   This information is not intended to replace advice given to you by your health care provider. Make sure you discuss any questions you have with your health care provider.   Document Released: 11/12/2000 Document Revised: 07/18/2013 Document Reviewed: 04/24/2013 Elsevier Interactive Patient Education 2016 [employer]  

## 2016-11-30 ENCOUNTER — Emergency Department (HOSPITAL_COMMUNITY)
Admission: EM | Admit: 2016-11-30 | Discharge: 2016-11-30 | Disposition: A | Discharge status: Home / Self Care | Payer: Non-veteran care | Attending: Emergency Medicine | Admitting: Emergency Medicine

## 2016-11-30 ENCOUNTER — Encounter (HOSPITAL_COMMUNITY): Payer: Self-pay

## 2016-11-30 ENCOUNTER — Emergency Department (HOSPITAL_COMMUNITY): Payer: Non-veteran care

## 2016-11-30 DIAGNOSIS — I5023 Acute on chronic systolic (congestive) heart failure: Secondary | ICD-10-CM | POA: Diagnosis not present

## 2016-11-30 DIAGNOSIS — Z7982 Long term (current) use of aspirin: Secondary | ICD-10-CM | POA: Insufficient documentation

## 2016-11-30 DIAGNOSIS — N189 Chronic kidney disease, unspecified: Secondary | ICD-10-CM | POA: Insufficient documentation

## 2016-11-30 DIAGNOSIS — E1122 Type 2 diabetes mellitus with diabetic chronic kidney disease: Secondary | ICD-10-CM | POA: Insufficient documentation

## 2016-11-30 DIAGNOSIS — Z87891 Personal history of nicotine dependence: Secondary | ICD-10-CM | POA: Diagnosis not present

## 2016-11-30 DIAGNOSIS — Z96642 Presence of left artificial hip joint: Secondary | ICD-10-CM | POA: Insufficient documentation

## 2016-11-30 DIAGNOSIS — I130 Hypertensive heart and chronic kidney disease with heart failure and stage 1 through stage 4 chronic kidney disease, or unspecified chronic kidney disease: Secondary | ICD-10-CM | POA: Diagnosis not present

## 2016-11-30 DIAGNOSIS — Z79899 Other long term (current) drug therapy: Secondary | ICD-10-CM | POA: Diagnosis not present

## 2016-11-30 DIAGNOSIS — R0602 Shortness of breath: Secondary | ICD-10-CM

## 2016-11-30 DIAGNOSIS — Z7984 Long term (current) use of oral hypoglycemic drugs: Secondary | ICD-10-CM | POA: Insufficient documentation

## 2016-11-30 LAB — PROTIME-INR
INR: 0.96
Prothrombin Time: 12.8 seconds (ref 11.4–15.2)

## 2016-11-30 LAB — CBC WITH DIFFERENTIAL/PLATELET
BASOS ABS: 0 10*3/uL (ref 0.0–0.1)
Basophils Relative: 0 %
EOS PCT: 3 %
Eosinophils Absolute: 0.3 10*3/uL (ref 0.0–0.7)
HEMATOCRIT: 34.6 % — AB (ref 39.0–52.0)
Hemoglobin: 11.4 g/dL — ABNORMAL LOW (ref 13.0–17.0)
LYMPHS PCT: 18 %
Lymphs Abs: 1.4 10*3/uL (ref 0.7–4.0)
MCH: 30.8 pg (ref 26.0–34.0)
MCHC: 32.9 g/dL (ref 30.0–36.0)
MCV: 93.5 fL (ref 78.0–100.0)
MONO ABS: 0.5 10*3/uL (ref 0.1–1.0)
MONOS PCT: 7 %
Neutro Abs: 5.4 10*3/uL (ref 1.7–7.7)
Neutrophils Relative %: 72 %
PLATELETS: 235 10*3/uL (ref 150–400)
RBC: 3.7 MIL/uL — ABNORMAL LOW (ref 4.22–5.81)
RDW: 13.6 % (ref 11.5–15.5)
WBC: 7.6 10*3/uL (ref 4.0–10.5)

## 2016-11-30 LAB — D-DIMER, QUANTITATIVE: D-Dimer, Quant: 1.12 ug/mL-FEU — ABNORMAL HIGH (ref 0.00–0.50)

## 2016-11-30 LAB — COMPREHENSIVE METABOLIC PANEL
ALK PHOS: 50 U/L (ref 38–126)
ALT: 16 U/L — ABNORMAL LOW (ref 17–63)
AST: 25 U/L (ref 15–41)
Albumin: 3 g/dL — ABNORMAL LOW (ref 3.5–5.0)
Anion gap: 9 (ref 5–15)
BILIRUBIN TOTAL: 0.6 mg/dL (ref 0.3–1.2)
BUN: 19 mg/dL (ref 6–20)
CALCIUM: 9.1 mg/dL (ref 8.9–10.3)
CO2: 24 mmol/L (ref 22–32)
Chloride: 108 mmol/L (ref 101–111)
Creatinine, Ser: 1.13 mg/dL (ref 0.61–1.24)
GFR calc Af Amer: >60 mL/min (ref >60)
GFR calc non Af Amer: 59 mL/min — ABNORMAL LOW (ref >60)
GLUCOSE: 129 mg/dL — AB (ref 65–99)
POTASSIUM: 4 mmol/L (ref 3.5–5.1)
SODIUM: 141 mmol/L (ref 135–145)
TOTAL PROTEIN: 7.4 g/dL (ref 6.5–8.1)

## 2016-11-30 LAB — I-STAT CHEM 8, ED
BUN: 24 mg/dL — AB (ref 6–20)
CHLORIDE: 108 mmol/L (ref 101–111)
CREATININE: 1.1 mg/dL (ref 0.61–1.24)
Calcium, Ion: 1.08 mmol/L — ABNORMAL LOW (ref 1.15–1.40)
GLUCOSE: 124 mg/dL — AB (ref 65–99)
HEMATOCRIT: 35 % — AB (ref 39.0–52.0)
Hemoglobin: 11.9 g/dL — ABNORMAL LOW (ref 13.0–17.0)
POTASSIUM: 4 mmol/L (ref 3.5–5.1)
Sodium: 142 mmol/L (ref 135–145)
TCO2: 24 mmol/L (ref 0–100)

## 2016-11-30 LAB — BRAIN NATRIURETIC PEPTIDE: B Natriuretic Peptide: 196.7 pg/mL — ABNORMAL HIGH (ref 0.0–100.0)

## 2016-11-30 LAB — I-STAT TROPONIN, ED: Troponin i, poc: 0.08 ng/mL (ref 0.00–0.08)

## 2016-11-30 MED ORDER — IOPAMIDOL (ISOVUE-370) INJECTION 76%
INTRAVENOUS | Status: AC
  Administered 2016-11-30: 100 mL
  Filled 2016-11-30: qty 100

## 2016-11-30 MED ORDER — SODIUM CHLORIDE 0.9 % IV SOLN
INTRAVENOUS | Status: DC
  Administered 2016-11-30: 18:00:00 via INTRAVENOUS

## 2016-11-30 NOTE — ED Triage Notes (Signed)
Pt brought in by [facility]. Pt c/o sudden onset shortness of breath while at rest for about an hour. Pt hx of CHF, Afib, and a defibrillator. Pt states he is having a little trouble breathing denies any pain or any other symptoms.

## 2016-11-30 NOTE — ED Notes (Signed)
Pt stable, understands discharge instructions, and reasons for return.   

## 2016-11-30 NOTE — Discharge Instructions (Signed)
Workup to include a CT angiogram of the chest without any snigficant findings other than bilateral small pleural effusions. No evidence of any pulmonary embolus no evidence of any significant congestive heart failure. No evidence of pneumonia. Again pointed to follow-up with your cardiology folks in [place]. Return for any new or worse symptoms.
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
## 2017-01-05 ENCOUNTER — Encounter: Payer: Self-pay | Admitting: Podiatry

## 2017-01-05 ENCOUNTER — Ambulatory Visit (INDEPENDENT_AMBULATORY_CARE_PROVIDER_SITE_OTHER): Payer: Non-veteran care | Admitting: Podiatry

## 2017-01-05 VITALS — BP 112/50 | HR 83 | Resp 18

## 2017-01-05 DIAGNOSIS — L84 Corns and callosities: Secondary | ICD-10-CM | POA: Diagnosis not present

## 2017-01-05 DIAGNOSIS — B351 Tinea unguium: Secondary | ICD-10-CM | POA: Diagnosis not present

## 2017-01-05 DIAGNOSIS — M79675 Pain in left toe(s): Secondary | ICD-10-CM | POA: Diagnosis not present

## 2017-01-05 DIAGNOSIS — M79674 Pain in right toe(s): Secondary | ICD-10-CM | POA: Diagnosis not present

## 2017-01-05 DIAGNOSIS — E119 Type 2 diabetes mellitus without complications: Secondary | ICD-10-CM

## 2017-01-05 NOTE — Progress Notes (Signed)
Patient ID: Ryan Macdonald, male   DOB: July 08, 1934, 83 y.o.   MRN: [id number]   This patient presents today for follow-up visit of 11/28/2015 complaining of nose and cough walking wearing shoes and a painful corn on the fifth right toe and requests treatment for both these areas. Patient was to return for follow-up visit of 11/28/2015, however, does not present today  Objective: DP and PT pulses 2/4 bilaterally Capillary reflex immediate bilaterally Vibratory sensation 24 intact bilaterally Sensation to 10 g monofilament wire intact 5/5 bilaterally Ankle reflex equal and reactive bilaterally No open skin lesions bilaterally Keratoses lateral fifth right toe The toenails are elongated, brittle, deformed 6-10 HAV left  Assessment: Diabetic with satisfactory neurovascular status Symptomatic onychomycoses 6-10 Keratoses 1  Plan: Debrided toenails 6-10 mechanically and legs without anybleeding Debrided keratoses 1 without anybleeding   Reappoint 3 months

## 2017-01-05 NOTE — Patient Instructions (Signed)

## 2017-01-15 ENCOUNTER — Encounter (HOSPITAL_COMMUNITY): Payer: Self-pay

## 2017-01-15 ENCOUNTER — Emergency Department (HOSPITAL_COMMUNITY): Payer: Non-veteran care

## 2017-01-15 ENCOUNTER — Emergency Department (HOSPITAL_COMMUNITY)
Admission: EM | Admit: 2017-01-15 | Discharge: 2017-01-15 | Disposition: A | Discharge status: Home / Self Care | Payer: Non-veteran care | Attending: Emergency Medicine | Admitting: Emergency Medicine

## 2017-01-15 DIAGNOSIS — Z7982 Long term (current) use of aspirin: Secondary | ICD-10-CM | POA: Diagnosis not present

## 2017-01-15 DIAGNOSIS — I130 Hypertensive heart and chronic kidney disease with heart failure and stage 1 through stage 4 chronic kidney disease, or unspecified chronic kidney disease: Secondary | ICD-10-CM | POA: Diagnosis not present

## 2017-01-15 DIAGNOSIS — E1122 Type 2 diabetes mellitus with diabetic chronic kidney disease: Secondary | ICD-10-CM | POA: Diagnosis not present

## 2017-01-15 DIAGNOSIS — I5023 Acute on chronic systolic (congestive) heart failure: Secondary | ICD-10-CM | POA: Insufficient documentation

## 2017-01-15 DIAGNOSIS — N189 Chronic kidney disease, unspecified: Secondary | ICD-10-CM | POA: Diagnosis not present

## 2017-01-15 DIAGNOSIS — Z87891 Personal history of nicotine dependence: Secondary | ICD-10-CM | POA: Diagnosis not present

## 2017-01-15 DIAGNOSIS — Z79899 Other long term (current) drug therapy: Secondary | ICD-10-CM | POA: Insufficient documentation

## 2017-01-15 DIAGNOSIS — Z7984 Long term (current) use of oral hypoglycemic drugs: Secondary | ICD-10-CM | POA: Diagnosis not present

## 2017-01-15 DIAGNOSIS — Z95810 Presence of automatic (implantable) cardiac defibrillator: Secondary | ICD-10-CM | POA: Diagnosis not present

## 2017-01-15 DIAGNOSIS — Z96642 Presence of left artificial hip joint: Secondary | ICD-10-CM | POA: Insufficient documentation

## 2017-01-15 DIAGNOSIS — R0602 Shortness of breath: Secondary | ICD-10-CM | POA: Diagnosis not present

## 2017-01-15 LAB — CBC WITH DIFFERENTIAL/PLATELET
BASOS PCT: 0 %
Basophils Absolute: 0 10*3/uL (ref 0.0–0.1)
Eosinophils Absolute: 0.2 10*3/uL (ref 0.0–0.7)
Eosinophils Relative: 3 %
HEMATOCRIT: 36.5 % — AB (ref 39.0–52.0)
Hemoglobin: 12.1 g/dL — ABNORMAL LOW (ref 13.0–17.0)
Lymphocytes Relative: 20 %
Lymphs Abs: 1.3 10*3/uL (ref 0.7–4.0)
MCH: 30.9 pg (ref 26.0–34.0)
MCHC: 33.2 g/dL (ref 30.0–36.0)
MCV: 93.1 fL (ref 78.0–100.0)
MONOS PCT: 6 %
Monocytes Absolute: 0.4 10*3/uL (ref 0.1–1.0)
Neutro Abs: 4.9 10*3/uL (ref 1.7–7.7)
Neutrophils Relative %: 71 %
Platelets: 192 10*3/uL (ref 150–400)
RBC: 3.92 MIL/uL — ABNORMAL LOW (ref 4.22–5.81)
RDW: 14.1 % (ref 11.5–15.5)
WBC: 6.8 10*3/uL (ref 4.0–10.5)

## 2017-01-15 LAB — BASIC METABOLIC PANEL
Anion gap: 9 (ref 5–15)
BUN: 16 mg/dL (ref 6–20)
CALCIUM: 9 mg/dL (ref 8.9–10.3)
CO2: 25 mmol/L (ref 22–32)
CREATININE: 1.06 mg/dL (ref 0.61–1.24)
Chloride: 105 mmol/L (ref 101–111)
GFR calc non Af Amer: >60 mL/min (ref >60)
Glucose, Bld: 118 mg/dL — ABNORMAL HIGH (ref 65–99)
Potassium: 3.9 mmol/L (ref 3.5–5.1)
SODIUM: 139 mmol/L (ref 135–145)

## 2017-01-15 LAB — I-STAT TROPONIN, ED: Troponin i, poc: 0.02 ng/mL (ref 0.00–0.08)

## 2017-01-15 NOTE — Discharge Instructions (Signed)
Follow up with your PCP, call them on Monday, return for worsening symptoms.  Continue your lasix as prescribed.

## 2017-01-15 NOTE — ED Provider Notes (Signed)
[facility]-EMER[facility]Y DEPT Provider Note   CSN: [id number] Arrival date & time: 01/15/17  1306     History   Chief Complaint Chief Complaint  Patient presents with  . Shortness of Breath    HPI Ryan Macdonald is a 83 y.o. male.  83 yo M with a chief complaint of shortness of breath. This occurred spontaneously while the patient was at rest. He also has resolved spontaneously. Patient did take an extra Lasix which he is told to by his cardiologist. He thinks it is significantly better after urinating about a liter and a half. He denied any worsening lower extremity edema. Denied orthopnea or PND. Denied chest pain diaphoresis nausea or vomiting.   The history is provided by the patient.  Shortness of Breath  This is a new problem. The average episode lasts 2 days. The problem occurs continuously.The current episode started yesterday. The problem has not changed since onset.Pertinent negatives include no fever, no headaches, no chest pain, no vomiting, no abdominal pain and no rash. He has tried nothing for the symptoms. The treatment provided no relief. Associated medical issues include heart failure.    Past Medical History:  Diagnosis Date  . Cat scratch fever    removed mass on right side neck  . Cataracts, bilateral   . CHF (congestive heart failure) (HCC)   . Diabetes mellitus without complication (HCC)    TYPE 2  . High cholesterol   . Hypertension   . Kidney stones   . Morbid obesity (HCC)   . Nonischemic cardiomyopathy (HCC)    a. ? 2009 Cath in [place] - nl cors per pt;  b. 09/2013 Echo: EF 25-30%, sev diff HK.  . Osteoarthritis   . PAF (paroxysmal atrial fibrillation) (HCC)    a. post-op hip in 2014 - prev on xarelto.  . Shortness of breath dyspnea     Patient Active Problem List   Diagnosis Date Noted  . Acute on chronic respiratory failure with hypoxia (HCC)   . Respiratory distress 05/19/2016  . Heart failure (HCC) 05/19/2016  . Depression 05/19/2016  . Prolonged  Q-T interval on ECG 05/19/2016  . Gout 05/19/2016  . 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 Pneumonia   . NSVT (nonsustained ventricular tachycardia) (HCC)   . CKD (chronic kidney disease)   . HCAP (healthcare-associated pneumonia) 08/21/2015  . Acute on chronic systolic heart failure (HCC) 08/21/2015  . Hyperkalemia 08/21/2015  . PE (pulmonary embolism)   . Acute respiratory failure (HCC)   . Hypoxia 05/22/2015  . Acute respiratory failure, unspecified whether with hypoxia or hypercapnia (HCC)   . Osteoarthritis of left hip 10/25/2013  . Atrial fibrillation (HCC) 10/25/2013  . S/P hip replacement 10/24/2013  . Nonischemic cardiomyopathy (HCC)   . Obesity (BMI 30-39.9)   . Essential hypertension   . Hyperlipidemia     Past Surgical History:  Procedure Laterality Date  . CARDIAC CATHETERIZATION  1980's  . CIRCUMCISION  1940's  . CYSTOSCOPY WITH URETHRAL DILATATION  10/23/2013  . CYSTOSCOPY WITH URETHRAL DILATATION N/A 10/23/2013   Procedure: CYSTOSCOPY WITH URETHRAL DILATATION;  Surgeon: Kathi Ludwig, MD;  Location: [facility] OR;  Service: Urology;  Laterality: N/A;  . INCISION AND DRAINAGE OF WOUND Right 1980's   "cat scratch" (10/23/2013)  . INGUINAL HERNIA REPAIR Right 1950's  . KIDNEY STONE SURGERY Right 1970's; 1983   "twice"  . TONSILLECTOMY AND ADENOIDECTOMY  1940's  . TOTAL HIP ARTHROPLASTY Left 10/23/2013  . TOTAL HIP ARTHROPLASTY Left 10/23/2013  Procedure: TOTAL HIP ARTHROPLASTY;  Surgeon: Valeria Batman, MD;  Location: [facility] OR;  Service: Orthopedics;  Laterality: Left;       Home Medications    Prior to Admission medications   Medication Sig Start Date End Date Taking? Authorizing Provider  allopurinol (ZYLOPRIM) 100 MG tablet Take 200 mg by mouth daily.   Yes Historical Provider, MD  aspirin EC 81 MG tablet Take 81 mg by mouth daily.   Yes Historical Provider, MD  carvedilol (COREG) 12.5 MG tablet Take 12.5 mg by mouth 2  (two) times daily with a meal.   Yes Historical Provider, MD  cyanocobalamin 1000 MCG tablet Take 1,000 mcg by mouth daily.    Yes Historical Provider, MD  diphenhydrAMINE (BENADRYL) 25 mg capsule Take 25 mg by mouth at bedtime as needed for sleep.   Yes Historical Provider, MD  finasteride (PROSCAR) 5 MG tablet Take 5 mg by mouth daily.   Yes Historical Provider, MD  furosemide (LASIX) 40 MG tablet Take 1 tablet (40 mg total) by mouth 2 (two) times daily. 05/31/16  Yes Janetta Hora, PA-C  glipiZIDE (GLUCOTROL) 5 MG tablet Take 5 mg by mouth daily before breakfast.   Yes Historical Provider, MD  hydrALAZINE (APRESOLINE) 25 MG tablet Take 25 mg by mouth 3 (three) times daily.   Yes Historical Provider, MD  HYDROcodone-acetaminophen (NORCO/VICODIN) 5-325 MG tablet Take 2 tablets by mouth every 4 (four) hours as needed. Patient taking differently: Take 1 tablet by mouth 2 (two) times daily as needed (for back pain).  04/09/16  Yes Isa Rankin, MD  isosorbide dinitrate (DILATRATE-SR) 40 MG CR capsule Take 40 mg by mouth 3 (three) times daily.   Yes Historical Provider, MD  ketoconazole (NIZORAL) 2 % shampoo Apply 1 application topically 2 (two) times a week.   Yes Historical Provider, MD  sertraline (ZOLOFT) 25 MG tablet Take 50 mg by mouth daily as needed (for anxiety).    Yes Historical Provider, MD  simvastatin (ZOCOR) 80 MG tablet Take 40 mg by mouth daily.   Yes Historical Provider, MD  spironolactone (ALDACTONE) 25 MG tablet Take 25 mg by mouth daily.   Yes Historical Provider, MD  tamsulosin (FLOMAX) 0.4 MG CAPS capsule Take 0.4 mg by mouth at bedtime.   Yes Historical Provider, MD  colchicine 0.6 MG tablet Take 1 tablet (0.6 mg total) by mouth daily. Patient not taking: Reported on 11/30/2016 04/09/16 11/30/16  Isa Rankin, MD  oxyCODONE (ROXICODONE) 5 MG immediate release tablet Take 1 tablet (5 mg total) by mouth every 6 (six) hours as needed for breakthrough pain. Patient not taking: Reported  on 11/30/2016 09/10/16   Lyndal Pulley, MD    Family History Family History  Problem Relation Age of Onset  . Heart Problems Maternal Grandmother   . Other      negative for premature CAD    Social History Social History  Substance Use Topics  . Smoking status: Former Smoker    Packs/day: 1.00    Years: 15.00    Types: Cigarettes, Cigars  . Smokeless tobacco: Former Neurosurgeon     Comment: quit smoking ~ 40 yr ago  . Alcohol use No     Comment: rare beer.     Allergies   Lisinopril   Review of Systems Review of Systems  Constitutional: Negative for chills and fever.  HENT: Negative for congestion and facial swelling.   Eyes: Negative for discharge and visual disturbance.  Respiratory: Positive for  shortness of breath.   Cardiovascular: Negative for chest pain and palpitations.  Gastrointestinal: Negative for abdominal pain, diarrhea and vomiting.  Musculoskeletal: Negative for arthralgias and myalgias.  Skin: Negative for color change and rash.  Neurological: Negative for tremors, syncope and headaches.  Psychiatric/Behavioral: Negative for confusion and dysphoric mood.     Physical Exam Updated Vital Signs BP 105/84 (BP Location: Right Arm)   Pulse 76   Temp 97.9 F (36.6 C) (Oral)   Resp 18   Ht 5\' 8"  (1.727 m)   Wt 251 lb (113.9 kg)   SpO2 97%   BMI 38.16 kg/m   Physical Exam  Constitutional: He is oriented to person, place, and time. He appears well-developed and well-nourished.  HENT:  Head: Normocephalic and atraumatic.  Eyes: EOM are normal. Pupils are equal, round, and reactive to light.  Neck: Normal range of motion. Neck supple. No JVD present.  Cardiovascular: Normal rate and regular rhythm.  Exam reveals no gallop and no friction rub.   No murmur heard. Pulmonary/Chest: No respiratory distress. He has no wheezes.  Abdominal: He exhibits no distension and no mass. There is no tenderness. There is no rebound and no guarding.  Musculoskeletal: Normal  range of motion.  Neurological: He is alert and oriented to person, place, and time.  Skin: No rash noted. No pallor.  Psychiatric: He has a normal mood and affect. His behavior is normal.  Nursing note and vitals reviewed.    ED Treatments / Results  Labs (all labs ordered are listed, but only abnormal results are displayed) Labs Reviewed  CBC WITH DIFFERENTIAL/PLATELET - Abnormal; Notable for the following:       Result Value   RBC 3.92 (*)    Hemoglobin 12.1 (*)    HCT 36.5 (*)    All other components within normal limits  BASIC METABOLIC PANEL - Abnormal; Notable for the following:    Glucose, Bld 118 (*)    All other components within normal limits  I-STAT TROPOININ, ED    EKG  EKG Interpretation  Date/Time:  Saturday January 15 2017 13:13:56 EST Ventricular Rate:  84 PR Interval:    QRS Duration: 101 QT Interval:  377 QTC Calculation: 446 R Axis:   49 Text Interpretation:  Atrial fibrillation Low voltage, precordial leads Nonspecific T abnormalities, lateral leads No significant change since last tracing Confirmed by Texie Tupou MD, DANIEL [phone]) on 01/15/2017 1:17:25 PM       Radiology Dg Chest 2 View  Result Date: 01/15/2017 CLINICAL DATA:  Shortness of breath.  Atrial fibrillation EXAM: CHEST  2 VIEW COMPARISON:  November 30, 2016 chest radiograph and chest CT November 30, 2016 FINDINGS: There is no edema or consolidation. Heart is upper normal in size with pulmonary vascularity within normal limits. Pacemaker lead tip is attached to the right ventricle. There is aortic atherosclerosis. No adenopathy. There is degenerative change in the thoracic spine. IMPRESSION: No edema or consolidation.  Aortic atherosclerosis. Electronically Signed   By: Bretta Bang III M.D.   On: 01/15/2017 14:31    Procedures Procedures (including critical care time)  Medications Ordered in ED Medications - No data to display   Initial Impression / Assessment and Plan / ED Course  I  have reviewed the triage vital signs and the nursing notes.  Pertinent labs & imaging results that were available during my care of the patient were reviewed by me and considered in my medical decision making (see chart for details).  83 yo M with cc of sob.  I'm unsure of etiology of his symptoms. Patient has had similar events in the past that he had negative workups 4. Patient has had a prior PE though I doubt this is the cause of his acute onset and resolution of symptoms. This may be worsening heart failure that improved with his increased diuretic use at home.  Evaluation here with no significant finding. Troponin is negative. Do not feel like I need to do a delta as this was completely atypical. We'll have the patient follow with his family physician and cardiologist. Have given him a call on Monday. Strict return precautions.  3:11 PM:  I have discussed the diagnosis/risks/treatment options with the patient and family and believe the pt to be eligible for discharge home to follow-up with PCP, cards. We also discussed returning to the ED immediately if new or worsening sx occur. We discussed the sx which are most concerning (e.g., sudden worsening pain, fever, inability to tolerate by mouth) that necessitate immediate return. Medications administered to the patient during their visit and any new prescriptions provided to the patient are listed below.  Medications given during this visit Medications - No data to display   The patient appears reasonably screen and/or stabilized for discharge and I doubt any other medical condition or other [facility] requiring further screening, evaluation, or treatment in the ED at this time prior to discharge.    Final Clinical Impressions(s) / ED Diagnoses   Final diagnoses:  SOB (shortness of breath)    New Prescriptions New Prescriptions   No medications on file     Melene Plan, DO 01/15/17 1511

## 2017-01-15 NOTE — ED Notes (Signed)
ED Provider at bedside. 

## 2017-01-15 NOTE — ED Triage Notes (Signed)
Pt presents to the ed with complaints of shortness of breath, he was just diagnosed with atrial fib and is not on blood thinners.  He took an extra lasix this morning per his doctor if he felt short of breath and has not noticed a difference, denies any pain

## 2017-01-15 NOTE — ED Notes (Signed)
Patient transported to X-ray 

## 2017-02-03 ENCOUNTER — Inpatient Hospital Stay (HOSPITAL_COMMUNITY)
Admission: EM | Admit: 2017-02-03 | Discharge: 2017-02-06 | DRG: 291 | Disposition: A | Discharge status: Home / Self Care | Payer: Medicare Other | Attending: Internal Medicine | Admitting: Internal Medicine

## 2017-02-03 ENCOUNTER — Encounter (HOSPITAL_COMMUNITY): Payer: Self-pay | Admitting: *Deleted

## 2017-02-03 ENCOUNTER — Emergency Department (HOSPITAL_COMMUNITY): Payer: Medicare Other

## 2017-02-03 DIAGNOSIS — E669 Obesity, unspecified: Secondary | ICD-10-CM | POA: Diagnosis present

## 2017-02-03 DIAGNOSIS — Z888 Allergy status to other drugs, medicaments and biological substances status: Secondary | ICD-10-CM | POA: Diagnosis not present

## 2017-02-03 DIAGNOSIS — I480 Paroxysmal atrial fibrillation: Secondary | ICD-10-CM | POA: Diagnosis present

## 2017-02-03 DIAGNOSIS — Z87442 Personal history of urinary calculi: Secondary | ICD-10-CM | POA: Diagnosis not present

## 2017-02-03 DIAGNOSIS — J181 Lobar pneumonia, unspecified organism: Secondary | ICD-10-CM | POA: Insufficient documentation

## 2017-02-03 DIAGNOSIS — M109 Gout, unspecified: Secondary | ICD-10-CM | POA: Diagnosis present

## 2017-02-03 DIAGNOSIS — E1169 Type 2 diabetes mellitus with other specified complication: Secondary | ICD-10-CM | POA: Diagnosis present

## 2017-02-03 DIAGNOSIS — I110 Hypertensive heart disease with heart failure: Secondary | ICD-10-CM | POA: Diagnosis present

## 2017-02-03 DIAGNOSIS — E118 Type 2 diabetes mellitus with unspecified complications: Secondary | ICD-10-CM | POA: Diagnosis not present

## 2017-02-03 DIAGNOSIS — E785 Hyperlipidemia, unspecified: Secondary | ICD-10-CM | POA: Diagnosis present

## 2017-02-03 DIAGNOSIS — F329 Major depressive disorder, single episode, unspecified: Secondary | ICD-10-CM | POA: Diagnosis present

## 2017-02-03 DIAGNOSIS — Z79899 Other long term (current) drug therapy: Secondary | ICD-10-CM | POA: Diagnosis not present

## 2017-02-03 DIAGNOSIS — Z96642 Presence of left artificial hip joint: Secondary | ICD-10-CM | POA: Diagnosis present

## 2017-02-03 DIAGNOSIS — Z8249 Family history of ischemic heart disease and other diseases of the circulatory system: Secondary | ICD-10-CM

## 2017-02-03 DIAGNOSIS — M1990 Unspecified osteoarthritis, unspecified site: Secondary | ICD-10-CM | POA: Diagnosis present

## 2017-02-03 DIAGNOSIS — I420 Dilated cardiomyopathy: Secondary | ICD-10-CM | POA: Diagnosis present

## 2017-02-03 DIAGNOSIS — J189 Pneumonia, unspecified organism: Secondary | ICD-10-CM | POA: Diagnosis present

## 2017-02-03 DIAGNOSIS — Z95810 Presence of automatic (implantable) cardiac defibrillator: Secondary | ICD-10-CM | POA: Diagnosis present

## 2017-02-03 DIAGNOSIS — Z87891 Personal history of nicotine dependence: Secondary | ICD-10-CM | POA: Diagnosis not present

## 2017-02-03 DIAGNOSIS — E119 Type 2 diabetes mellitus without complications: Secondary | ICD-10-CM | POA: Diagnosis present

## 2017-02-03 DIAGNOSIS — I5023 Acute on chronic systolic (congestive) heart failure: Secondary | ICD-10-CM | POA: Diagnosis present

## 2017-02-03 DIAGNOSIS — Z9889 Other specified postprocedural states: Secondary | ICD-10-CM | POA: Diagnosis not present

## 2017-02-03 DIAGNOSIS — J9601 Acute respiratory failure with hypoxia: Secondary | ICD-10-CM | POA: Diagnosis not present

## 2017-02-03 DIAGNOSIS — I10 Essential (primary) hypertension: Secondary | ICD-10-CM | POA: Diagnosis present

## 2017-02-03 DIAGNOSIS — F32A Depression, unspecified: Secondary | ICD-10-CM | POA: Diagnosis present

## 2017-02-03 DIAGNOSIS — J9600 Acute respiratory failure, unspecified whether with hypoxia or hypercapnia: Secondary | ICD-10-CM | POA: Diagnosis present

## 2017-02-03 DIAGNOSIS — Z6839 Body mass index (BMI) 39.0-39.9, adult: Secondary | ICD-10-CM | POA: Diagnosis not present

## 2017-02-03 DIAGNOSIS — I4891 Unspecified atrial fibrillation: Secondary | ICD-10-CM | POA: Diagnosis present

## 2017-02-03 DIAGNOSIS — I509 Heart failure, unspecified: Secondary | ICD-10-CM | POA: Diagnosis not present

## 2017-02-03 DIAGNOSIS — Z7982 Long term (current) use of aspirin: Secondary | ICD-10-CM | POA: Diagnosis not present

## 2017-02-03 DIAGNOSIS — I428 Other cardiomyopathies: Secondary | ICD-10-CM

## 2017-02-03 HISTORY — DX: Pneumonia, unspecified organism: J18.9

## 2017-02-03 LAB — CBG MONITORING, ED: GLUCOSE-CAPILLARY: 110 mg/dL — AB (ref 65–99)

## 2017-02-03 LAB — I-STAT CG4 LACTIC ACID, ED
LACTIC ACID, VENOUS: 2.8 mmol/L — AB (ref 0.5–1.9)
Lactic Acid, Venous: 1.64 mmol/L (ref 0.5–1.9)

## 2017-02-03 LAB — I-STAT CHEM 8, ED
BUN: 29 mg/dL — ABNORMAL HIGH (ref 6–20)
CALCIUM ION: 1.07 mmol/L — AB (ref 1.15–1.40)
CREATININE: 1.2 mg/dL (ref 0.61–1.24)
Chloride: 105 mmol/L (ref 101–111)
GLUCOSE: 243 mg/dL — AB (ref 65–99)
HCT: 41 % (ref 39.0–52.0)
HEMOGLOBIN: 13.9 g/dL (ref 13.0–17.0)
Potassium: 4.7 mmol/L (ref 3.5–5.1)
Sodium: 140 mmol/L (ref 135–145)
TCO2: 29 mmol/L (ref 0–100)

## 2017-02-03 LAB — I-STAT ARTERIAL BLOOD GAS, ED
Acid-base deficit: 3 mmol/L — ABNORMAL HIGH (ref 0.0–2.0)
BICARBONATE: 23 mmol/L (ref 20.0–28.0)
O2 SAT: 89 %
TCO2: 24 mmol/L (ref 0–100)
pCO2 arterial: 44.7 mmHg (ref 32.0–48.0)
pH, Arterial: 7.319 — ABNORMAL LOW (ref 7.350–7.450)
pO2, Arterial: 60 mmHg — ABNORMAL LOW (ref 83.0–108.0)

## 2017-02-03 LAB — I-STAT TROPONIN, ED
TROPONIN I, POC: 0.05 ng/mL (ref 0.00–0.08)
Troponin i, poc: 0.04 ng/mL (ref 0.00–0.08)

## 2017-02-03 LAB — CBC WITH DIFFERENTIAL/PLATELET
Basophils Absolute: 0 10*3/uL (ref 0.0–0.1)
Basophils Relative: 0 %
Eosinophils Absolute: 0.2 10*3/uL (ref 0.0–0.7)
Eosinophils Relative: 3 %
HEMATOCRIT: 40.1 % (ref 39.0–52.0)
Hemoglobin: 13.3 g/dL (ref 13.0–17.0)
LYMPHS ABS: 2.9 10*3/uL (ref 0.7–4.0)
LYMPHS PCT: 30 %
MCH: 31.4 pg (ref 26.0–34.0)
MCHC: 33.2 g/dL (ref 30.0–36.0)
MCV: 94.6 fL (ref 78.0–100.0)
MONO ABS: 0.8 10*3/uL (ref 0.1–1.0)
MONOS PCT: 8 %
NEUTROS ABS: 5.7 10*3/uL (ref 1.7–7.7)
Neutrophils Relative %: 59 %
PLATELETS: 248 10*3/uL (ref 150–400)
RBC: 4.24 MIL/uL (ref 4.22–5.81)
RDW: 14.3 % (ref 11.5–15.5)
WBC: 9.7 10*3/uL (ref 4.0–10.5)

## 2017-02-03 LAB — BRAIN NATRIURETIC PEPTIDE: B NATRIURETIC PEPTIDE 5: 375.3 pg/mL — AB (ref 0.0–100.0)

## 2017-02-03 LAB — MRSA PCR SCREENING: MRSA BY PCR: NEGATIVE

## 2017-02-03 LAB — PROCALCITONIN: PROCALCITONIN: 0.12 ng/mL

## 2017-02-03 LAB — INFLUENZA PANEL BY PCR (TYPE A & B)
INFLBPCR: NEGATIVE
Influenza A By PCR: NEGATIVE

## 2017-02-03 LAB — STREP PNEUMONIAE URINARY ANTIGEN: Strep Pneumo Urinary Antigen: NEGATIVE

## 2017-02-03 LAB — GLUCOSE, CAPILLARY: Glucose-Capillary: 116 mg/dL — ABNORMAL HIGH (ref 65–99)

## 2017-02-03 MED ORDER — INSULIN ASPART 100 UNIT/ML ~~LOC~~ SOLN
0.0000 [IU] | Freq: Three times a day (TID) | SUBCUTANEOUS | Status: DC
  Administered 2017-02-05: 3 [IU] via SUBCUTANEOUS

## 2017-02-03 MED ORDER — APIXABAN 5 MG PO TABS
5.0000 mg | ORAL_TABLET | Freq: Two times a day (BID) | ORAL | Status: DC
  Administered 2017-02-03 – 2017-02-06 (×7): 5 mg via ORAL
  Filled 2017-02-03 (×9): qty 1

## 2017-02-03 MED ORDER — SODIUM CHLORIDE 0.9% FLUSH: 3.0000 mL | INTRAVENOUS | Status: DC | PRN

## 2017-02-03 MED ORDER — HYDROCODONE-ACETAMINOPHEN 5-325 MG PO TABS
1.0000 | ORAL_TABLET | Freq: Two times a day (BID) | ORAL | Status: DC | PRN
  Administered 2017-02-03 – 2017-02-05 (×4): 1 via ORAL
  Filled 2017-02-03 (×5): qty 1

## 2017-02-03 MED ORDER — DEXTROSE 5 % IV SOLN
500.0000 mg | INTRAVENOUS | Status: DC
  Administered 2017-02-04 – 2017-02-06 (×3): 500 mg via INTRAVENOUS
  Filled 2017-02-03 (×3): qty 500

## 2017-02-03 MED ORDER — DEXTROSE 5 % IV SOLN
1.0000 g | Freq: Once | INTRAVENOUS | Status: DC
  Filled 2017-02-03: qty 10

## 2017-02-03 MED ORDER — ONDANSETRON HCL 4 MG/2ML IJ SOLN
4.0000 mg | Freq: Four times a day (QID) | INTRAMUSCULAR | Status: DC | PRN
  Administered 2017-02-05: 4 mg via INTRAVENOUS
  Filled 2017-02-03: qty 2

## 2017-02-03 MED ORDER — FUROSEMIDE 10 MG/ML IJ SOLN
80.0000 mg | Freq: Two times a day (BID) | INTRAMUSCULAR | Status: DC
  Administered 2017-02-03 – 2017-02-05 (×4): 80 mg via INTRAVENOUS
  Filled 2017-02-03 (×4): qty 8

## 2017-02-03 MED ORDER — FUROSEMIDE 10 MG/ML IJ SOLN
80.0000 mg | Freq: Once | INTRAMUSCULAR | Status: AC
  Administered 2017-02-03: 80 mg via INTRAVENOUS
  Filled 2017-02-03: qty 8

## 2017-02-03 MED ORDER — ISOSORBIDE DINITRATE ER 40 MG PO CPCR
40.0000 mg | ORAL_CAPSULE | Freq: Three times a day (TID) | ORAL | Status: DC
  Administered 2017-02-03 – 2017-02-06 (×7): 40 mg via ORAL
  Filled 2017-02-03 (×12): qty 1

## 2017-02-03 MED ORDER — ALLOPURINOL 100 MG PO TABS
200.0000 mg | ORAL_TABLET | Freq: Every day | ORAL | Status: DC
  Administered 2017-02-04 – 2017-02-06 (×3): 200 mg via ORAL
  Filled 2017-02-03 (×3): qty 2

## 2017-02-03 MED ORDER — DEXTROSE 5 % IV SOLN
500.0000 mg | Freq: Once | INTRAVENOUS | Status: AC
  Administered 2017-02-03: 500 mg via INTRAVENOUS
  Filled 2017-02-03: qty 500

## 2017-02-03 MED ORDER — POTASSIUM CHLORIDE CRYS ER 20 MEQ PO TBCR
20.0000 meq | EXTENDED_RELEASE_TABLET | Freq: Two times a day (BID) | ORAL | Status: DC
  Administered 2017-02-03 – 2017-02-06 (×6): 20 meq via ORAL
  Filled 2017-02-03 (×6): qty 1

## 2017-02-03 MED ORDER — CEFTRIAXONE SODIUM 1 G IJ SOLR
1.0000 g | INTRAMUSCULAR | Status: DC
  Administered 2017-02-04 – 2017-02-05 (×2): 1 g via INTRAVENOUS
  Filled 2017-02-03 (×3): qty 10

## 2017-02-03 MED ORDER — ACETAMINOPHEN 325 MG PO TABS: 650.0000 mg | ORAL_TABLET | ORAL | Status: DC | PRN

## 2017-02-03 MED ORDER — ASPIRIN 81 MG PO CHEW
324.0000 mg | CHEWABLE_TABLET | Freq: Once | ORAL | Status: AC
  Administered 2017-02-03: 324 mg via ORAL
  Filled 2017-02-03: qty 4

## 2017-02-03 MED ORDER — SODIUM CHLORIDE 0.9 % IV SOLN: 250.0000 mL | INTRAVENOUS | Status: DC | PRN

## 2017-02-03 MED ORDER — FINASTERIDE 5 MG PO TABS
5.0000 mg | ORAL_TABLET | Freq: Every day | ORAL | Status: DC
  Administered 2017-02-04 – 2017-02-06 (×3): 5 mg via ORAL
  Filled 2017-02-03 (×3): qty 1

## 2017-02-03 MED ORDER — TAMSULOSIN HCL 0.4 MG PO CAPS
0.4000 mg | ORAL_CAPSULE | Freq: Every day | ORAL | Status: DC
  Administered 2017-02-03 – 2017-02-05 (×3): 0.4 mg via ORAL
  Filled 2017-02-03 (×3): qty 1

## 2017-02-03 MED ORDER — INSULIN ASPART 100 UNIT/ML ~~LOC~~ SOLN: 0.0000 [IU] | SUBCUTANEOUS | Status: DC

## 2017-02-03 MED ORDER — VITAMIN B-12 1000 MCG PO TABS
1000.0000 ug | ORAL_TABLET | Freq: Every day | ORAL | Status: DC
  Administered 2017-02-04 – 2017-02-06 (×3): 1000 ug via ORAL
  Filled 2017-02-03 (×3): qty 1

## 2017-02-03 MED ORDER — ORAL CARE MOUTH RINSE
15.0000 mL | Freq: Two times a day (BID) | OROMUCOSAL | Status: DC
  Administered 2017-02-03 – 2017-02-06 (×5): 15 mL via OROMUCOSAL

## 2017-02-03 MED ORDER — SODIUM CHLORIDE 0.9% FLUSH
3.0000 mL | Freq: Two times a day (BID) | INTRAVENOUS | Status: DC
  Administered 2017-02-03 – 2017-02-06 (×6): 3 mL via INTRAVENOUS

## 2017-02-03 MED ORDER — SERTRALINE HCL 50 MG PO TABS
50.0000 mg | ORAL_TABLET | Freq: Every day | ORAL | Status: DC | PRN
  Administered 2017-02-05: 50 mg via ORAL
  Filled 2017-02-03: qty 1

## 2017-02-03 MED ORDER — CARVEDILOL 12.5 MG PO TABS
12.5000 mg | ORAL_TABLET | Freq: Two times a day (BID) | ORAL | Status: DC
  Administered 2017-02-03 – 2017-02-06 (×6): 12.5 mg via ORAL
  Filled 2017-02-03 (×6): qty 1

## 2017-02-03 MED ORDER — ATORVASTATIN CALCIUM 40 MG PO TABS
40.0000 mg | ORAL_TABLET | Freq: Every day | ORAL | Status: DC
  Administered 2017-02-03 – 2017-02-05 (×3): 40 mg via ORAL
  Filled 2017-02-03 (×3): qty 1

## 2017-02-03 MED ORDER — ASPIRIN EC 81 MG PO TBEC
81.0000 mg | DELAYED_RELEASE_TABLET | Freq: Every day | ORAL | Status: DC
  Administered 2017-02-04 – 2017-02-06 (×3): 81 mg via ORAL
  Filled 2017-02-03 (×3): qty 1

## 2017-02-03 MED ORDER — HYDRALAZINE HCL 25 MG PO TABS
25.0000 mg | ORAL_TABLET | Freq: Three times a day (TID) | ORAL | Status: DC
  Administered 2017-02-03 – 2017-02-05 (×7): 25 mg via ORAL
  Filled 2017-02-03 (×9): qty 1

## 2017-02-03 NOTE — Progress Notes (Signed)
Patient is on 3L [place] with SP02 96% at this time. No respiratory distress noted. BIPAP is not needed at this time. RT will monitor as needed.

## 2017-02-03 NOTE — ED Notes (Signed)
Pt placed on venturi mask, O2 sats 97% and pt tolerating well at this time. Will reassess.

## 2017-02-03 NOTE — ED Provider Notes (Signed)
[facility] DEPT Provider Note   CSN: [id number] Arrival date & time:        History   Chief Complaint Chief Complaint  Patient presents with  . Shortness of Breath    HPI Ryan Macdonald is a 83 y.o. male.  HPI Patient arrives EMS secondary to acute onset of shortness of breath over one day When EMS arrived, patient was satting in the 60s patient is known to have significant CHF and takes Lasix, spironolactone, with no new medication adjustments He states that he has not had any infectious symptoms including a cough, fever He denies any chest pain, back pain He states he feels short of breath but has never had a clot and denies using any blood thinners He does have atrial fibrillation but is not anticoagulated Patient was placed on the BiPAP in route and transported without event His initial blood pressure was in the 150 systolic upon EMS arrival He was given one breathing treatment in route Patient denies a history of COPD  Past Medical History:  Diagnosis Date  . Cat scratch fever    removed mass on right side neck  . Cataracts, bilateral   . CHF (congestive heart failure) (HCC)   . Diabetes mellitus without complication (HCC)    TYPE 2  . High cholesterol   . Hypertension   . Kidney stones   . Morbid obesity (HCC)   . Nonischemic cardiomyopathy (HCC)    a. ? 2009 Cath in [place] - nl cors per pt;  b. 09/2013 Echo: EF 25-30%, sev diff HK.  . Osteoarthritis   . PAF (paroxysmal atrial fibrillation) (HCC)    a. post-op hip in 2014 - prev on xarelto.  . Shortness of breath dyspnea     Patient Active Problem List   Diagnosis Date Noted  . Acute on chronic respiratory failure with hypoxia (HCC)   . Respiratory distress 05/19/2016  . Heart failure (HCC) 05/19/2016  . Depression 05/19/2016  . Prolonged Q-T interval on ECG 05/19/2016  . Gout 05/19/2016  . 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 Pneumonia   . NSVT (nonsustained  ventricular tachycardia) (HCC)   . CKD (chronic kidney disease)   . HCAP (healthcare-associated pneumonia) 08/21/2015  . Acute on chronic systolic heart failure (HCC) 08/21/2015  . Hyperkalemia 08/21/2015  . PE (pulmonary embolism)   . Acute respiratory failure (HCC)   . Hypoxia 05/22/2015  . Acute respiratory failure, unspecified whether with hypoxia or hypercapnia (HCC)   . Osteoarthritis of left hip 10/25/2013  . Atrial fibrillation (HCC) 10/25/2013  . S/P hip replacement 10/24/2013  . Nonischemic cardiomyopathy (HCC)   . Obesity (BMI 30-39.9)   . Essential hypertension   . Hyperlipidemia     Past Surgical History:  Procedure Laterality Date  . CARDIAC CATHETERIZATION  1980's  . CIRCUMCISION  1940's  . CYSTOSCOPY WITH URETHRAL DILATATION  10/23/2013  . CYSTOSCOPY WITH URETHRAL DILATATION N/A 10/23/2013   Procedure: CYSTOSCOPY WITH URETHRAL DILATATION;  Surgeon: Kathi Ludwig, MD;  Location: [facility] OR;  Service: Urology;  Laterality: N/A;  . INCISION AND DRAINAGE OF WOUND Right 1980's   "cat scratch" (10/23/2013)  . INGUINAL HERNIA REPAIR Right 1950's  . KIDNEY STONE SURGERY Right 1970's; 1983   "twice"  . TONSILLECTOMY AND ADENOIDECTOMY  1940's  . TOTAL HIP ARTHROPLASTY Left 10/23/2013  . TOTAL HIP ARTHROPLASTY Left 10/23/2013   Procedure: TOTAL HIP ARTHROPLASTY;  Surgeon: Valeria Batman, MD;  Location: [facility] OR;  Service: Orthopedics;  Laterality:  Left;       Home Medications    Prior to Admission medications   Medication Sig Start Date End Date Taking? Authorizing Provider  allopurinol (ZYLOPRIM) 100 MG tablet Take 200 mg by mouth daily.   Yes Historical Provider, MD  aspirin EC 81 MG tablet Take 81 mg by mouth daily.   Yes Historical Provider, MD  carvedilol (COREG) 12.5 MG tablet Take 12.5 mg by mouth 2 (two) times daily with a meal.   Yes Historical Provider, MD  cyanocobalamin 1000 MCG tablet Take 1,000 mcg by mouth daily.    Yes Historical Provider, MD    diphenhydrAMINE (BENADRYL) 25 mg capsule Take 25 mg by mouth at bedtime as needed for sleep.   Yes Historical Provider, MD  finasteride (PROSCAR) 5 MG tablet Take 5 mg by mouth daily.   Yes Historical Provider, MD  furosemide (LASIX) 40 MG tablet Take 1 tablet (40 mg total) by mouth 2 (two) times daily. 05/31/16  Yes Janetta Hora, PA-C  glipiZIDE (GLUCOTROL) 5 MG tablet Take 5 mg by mouth daily before breakfast.   Yes Historical Provider, MD  hydrALAZINE (APRESOLINE) 25 MG tablet Take 25 mg by mouth 3 (three) times daily.   Yes Historical Provider, MD  HYDROcodone-acetaminophen (NORCO/VICODIN) 5-325 MG tablet Take 2 tablets by mouth every 4 (four) hours as needed. Patient taking differently: Take 1 tablet by mouth 2 (two) times daily as needed (for back pain).  04/09/16  Yes Isa Rankin, MD  isosorbide dinitrate (DILATRATE-SR) 40 MG CR capsule Take 40 mg by mouth 3 (three) times daily.   Yes Historical Provider, MD  ketoconazole (NIZORAL) 2 % shampoo Apply 1 application topically 2 (two) times a week.   Yes Historical Provider, MD  sertraline (ZOLOFT) 25 MG tablet Take 50 mg by mouth daily as needed (for anxiety).    Yes Historical Provider, MD  simvastatin (ZOCOR) 80 MG tablet Take 40 mg by mouth daily.   Yes Historical Provider, MD  spironolactone (ALDACTONE) 25 MG tablet Take 25 mg by mouth daily.   Yes Historical Provider, MD  tamsulosin (FLOMAX) 0.4 MG CAPS capsule Take 0.4 mg by mouth at bedtime.   Yes Historical Provider, MD  colchicine 0.6 MG tablet Take 1 tablet (0.6 mg total) by mouth daily. Patient not taking: Reported on 11/30/2016 04/09/16 11/30/16  Isa Rankin, MD  oxyCODONE (ROXICODONE) 5 MG immediate release tablet Take 1 tablet (5 mg total) by mouth every 6 (six) hours as needed for breakthrough pain. Patient not taking: Reported on 11/30/2016 09/10/16   Lyndal Pulley, MD    Family History Family History  Problem Relation Age of Onset  . Heart Problems Maternal Grandmother   .  Other      negative for premature CAD    Social History Social History  Substance Use Topics  . Smoking status: Former Smoker    Packs/day: 1.00    Years: 15.00    Types: Cigarettes, Cigars  . Smokeless tobacco: Former Neurosurgeon     Comment: quit smoking ~ 40 yr ago  . Alcohol use No     Comment: rare beer.     Allergies   Lisinopril   Review of Systems Review of Systems  Constitutional: Negative for fever.  Respiratory: Positive for shortness of breath. Negative for cough.   Cardiovascular: Positive for leg swelling. Negative for chest pain.  Allergic/Immunologic: Negative for immunocompromised state.  All other systems reviewed and are negative.  Physical Exam Updated Vital Signs  BP 110/67   Pulse 86   Resp 21   SpO2 98%   Physical Exam  Constitutional: He appears well-developed and well-nourished. No distress.  HENT:  Head: Normocephalic and atraumatic.  Eyes: Conjunctivae are normal. Pupils are equal, round, and reactive to light. Right eye exhibits no discharge. Left eye exhibits no discharge.  Neck: Normal range of motion. Neck supple.  Cardiovascular: Normal rate and regular rhythm.   No murmur heard. ICD scar without tenderness, device feels in place  Pulmonary/Chest: No respiratory distress. He has no wheezes. He has rales. He exhibits no tenderness.  tachypnic On BiPAP  Abdominal: Soft. Bowel sounds are normal. He exhibits no distension and no mass. There is no tenderness. There is no rebound and no guarding. No hernia.  Musculoskeletal: He exhibits edema (1+ pitting bilaterally, no asymmetry, no calf tenderness).  Neurological: He is alert.  Skin: Skin is warm. He is not diaphoretic.  Psychiatric: He has a normal mood and affect.  Nursing note and vitals reviewed.  ED Treatments / Results  Labs (all labs ordered are listed, but only abnormal results are displayed) Labs Reviewed  BRAIN NATRIURETIC PEPTIDE - Abnormal; Notable for the following:        Result Value   B Natriuretic Peptide 375.3 (*)    All other components within normal limits  I-STAT ARTERIAL BLOOD GAS, ED - Abnormal; Notable for the following:    pH, Arterial 7.319 (*)    pO2, Arterial 60.0 (*)    Acid-base deficit 3.0 (*)    All other components within normal limits  I-STAT CHEM 8, ED - Abnormal; Notable for the following:    BUN 29 (*)    Glucose, Bld 243 (*)    Calcium, Ion 1.07 (*)    All other components within normal limits  CULTURE, BLOOD (ROUTINE X 2)  CULTURE, BLOOD (ROUTINE X 2)  CBC WITH DIFFERENTIAL/PLATELET  INFLUENZA PANEL BY PCR (TYPE A & B)  I-STAT TROPOININ, ED  I-STAT CG4 LACTIC ACID, ED    EKG  EKG Interpretation  Date/Time:  Thursday February 03 2017 10:17:38 EST Ventricular Rate:  108 PR Interval:    QRS Duration: 93 QT Interval:  335 QTC Calculation: 449 R Axis:   53 Text Interpretation:  Ectopic atrial tachycardia, unifocal Repol abnrm, severe global ischemia (LM/MVD) No significant change since last tracing Confirmed by YAO  MD, DAVID ([id number]) on 02/03/2017 10:36:39 AM       Radiology Dg Chest Portable 1 View  Result Date: 02/03/2017 CLINICAL DATA:  Shortness of breath for the past 30 minutes. EXAM: PORTABLE CHEST 1 VIEW COMPARISON:  PA and lateral chest 01/15/2017.  CT chest 11/30/2016. FINDINGS: Dense airspace opacity is seen in the left mid lung zone. Right lung is clear. There is cardiomegaly without edema. No pneumothorax. Atherosclerosis is seen. AICD is in place. IMPRESSION: Dense airspace opacity left mid lung zone worrisome for pneumonia. Cardiomegaly. Atherosclerosis. Electronically Signed   By: Drusilla Kanner M.D.   On: 02/03/2017 10:51    Procedures Procedures (including critical care time)  Medications Ordered in ED Medications  cefTRIAXone (ROCEPHIN) 1 g in dextrose 5 % 50 mL IVPB (1 g Intravenous Restarted 02/03/17 1209)  azithromycin (ZITHROMAX) 500 mg in dextrose 5 % 250 mL IVPB (not administered)  aspirin chewable  tablet 324 mg (324 mg Oral Given 02/03/17 1145)  furosemide (LASIX) injection 80 mg (80 mg Intravenous Given 02/03/17 1054)     Initial Impression / Assessment and Plan / ED  Course  I have reviewed the triage vital signs and the nursing notes.  Pertinent labs & imaging results that were available during my care of the patient were reviewed by me and considered in my medical decision making (see chart for details).     Patient appears to have a CHF exacerbation given elevated BNP, hypertension at the scene, chest x-ray However, also possible component of pneumonia given chest x-ray If pneumonia, early and community-acquired; treated with azithromycin and Rocephin Lasix given EKG reassuring, trop negative Continues on BiPAP - will admit  Final Clinical Impressions(s) / ED Diagnoses   Final diagnoses:  Acute on chronic systolic congestive heart failure [facility])    New Prescriptions New Prescriptions   No medications on file     Sidney Ace, MD 02/03/17 1221    Charlynne Pander, MD 02/05/17 2032

## 2017-02-03 NOTE — ED Notes (Signed)
Patient denies pain and is resting comfortably.  

## 2017-02-03 NOTE — Progress Notes (Signed)
ANTICOAGULATION CONSULT NOTE  Pharmacy Consult for apixaban Indication: atrial fibrillation   Assessment: 82 yom presents with SOB. Patient was to be apixaban new start outpatient. Per [place] notes, [facility] ordered but pt not to start until after education at their anticoagulation clinic (Eliquis 5 mg PO BID per clinic report). Pharmacy consulted to start medication here. SCr 1.2 on admit, CBC wnl. No bleed documented.  Goal of Therapy:  Stroke prevention Monitor platelets by anticoagulation protocol: Yes   Plan:  Apixaban 5mg  PO BID Monitor CBC, s/sx bleeding  Babs Bertin, PharmD, BCPS Clinical Pharmacist 02/03/2017 2:17 PM

## 2017-02-03 NOTE — ED Notes (Signed)
Pt tolerating venturi mask well. Venturi mask changed from 6L to 4L; sats 95%

## 2017-02-03 NOTE — ED Notes (Signed)
5mg  Albuterol given enroute

## 2017-02-03 NOTE — ED Triage Notes (Signed)
To ED via [facility] for eval of sob for past 30 min. RA sat was 80% up to 88% on oxygen. Pt placed on CPAP and breathing better enroute. BiPAP placed on arrival to ED trauma room and able to given hx to staff. States he feels better on BiPap

## 2017-02-03 NOTE — H&P (Signed)
History and Physical    Ryan Macdonald KGM:[id number] DOB: 04-21-1934 DOA: 02/03/2017   PCP: [facility]   Patient coming from/Resides with: Private residence/wife  Admission status: Inpatient/SDU-medically necessary to stay a minimum 2 midnights to rule out impending and/or unexpected changes in physiologic status that may differ from initial evaluation performed in the ER and/or at time of admission. Patient presents with acute hypoxemic respiratory failure in setting of acute systolic heart failure exacerbation with possible superimposed pneumonia. Patient critically ill at time of presentation and requiring BiPAP, telemetry monitoring, IV Lasix to promote aggressive diuresis, frequent monitoring of hemodynamic status, strict intake/output and frequent monitoring of laboratory data while diuresing.  Chief Complaint: Shortness of breath  HPI: Ryan Macdonald is a 83 y.o. male with medical history significant for dilated nonischemic cardiomyopathy with chronic systolic heart failure EF 5-30% based on echocardiogram June 2017, diabetes on oral agents, hypertension, obesity, ICD, dyslipidemia, gout, depression, new diagnosis of atrial fibrillation with outpatient initiation of anticoagulation pending patient was evaluated in this ER on 2/17 after similar presentation. He had taken an extra dose of Lasix as instructed by his [place] cardiologist about a time he arrived to the ER his symptoms had improved and he noted increased urine output. Patient had stabilized enough and was able to be discharged from the ER at that time. Today after awakening patient developed abrupt onset of shortness of breath with associated hypertension. Symptoms persisted for greater than 30 minutes so EMS was called to the home. Room-air saturations were 80% with improvement up to 88% on oxygen but with increased work of breathing so was placed on CPAP by EMS as transitioned to BiPAP after arrival to the ER. Has been given  Lasix IV with patient reporting subjective improvement in symptoms.  ED Course:  Vital Signs: BP 102/66   Pulse 81   Resp 20   SpO2 100%  PCXR: Diffuse bilateral markings consistent with edema as well as a dense airspace opacity in the left mid lung zone worrisome for pneumonia. Lab data: Sodium 140, potassium 4.7, chloride 105, glucose 243, BUN 29, creatinine 1.2, BNP 375, POC troponin within normal limits 2 collections, lactic acid 2.8, white count 9700 with normal differential, hemoglobin 13.3, platelets 248,000, blood cultures obtained in the ER Medications and treatments: Aspirin 324 mg by mouth 1, Lasix 80 mg IV 1, Rocephin 1 g IV 1, Zithromax 500 mg IV 1 ordered but not yet infused  Review of Systems:  In addition to the HPI above,  No Fever-chills, myalgias or other constitutional symptoms No Headache, changes with Vision or hearing, new weakness, tingling, numbness in any extremity, dizziness, dysarthria or word finding difficulty, gait disturbance or imbalance, tremors or seizure activity No problems swallowing food or Liquids, indigestion/reflux, choking or coughing while eating, abdominal pain with or after eating No Chest pain, Cough, palpitations No Abdominal pain, N/V, melena,hematochezia, dark tarry stools, constipation No dysuria, malodorous urine, hematuria or flank pain No new skin rashes, lesions, masses or bruises, No new joint pains, aches, swelling or redness No recent unintentional weight gain or loss No polyuria, polydypsia or polyphagia   Past Medical History:  Diagnosis Date  . Cat scratch fever    removed mass on right side neck  . Cataracts, bilateral   . CHF (congestive heart failure) (HCC)   . Diabetes mellitus without complication (HCC)    TYPE 2  . High cholesterol   . Hypertension   . Kidney stones   . Morbid obesity (  HCC)   . Nonischemic cardiomyopathy (HCC)    a. ? 2009 Cath in [place] - nl cors per pt;  b. 09/2013 Echo: EF 25-30%, sev diff  HK.  . Osteoarthritis   . PAF (paroxysmal atrial fibrillation) (HCC)    a. post-op hip in 2014 - prev on xarelto.  . Shortness of breath dyspnea     Past Surgical History:  Procedure Laterality Date  . CARDIAC CATHETERIZATION  1980's  . CIRCUMCISION  1940's  . CYSTOSCOPY WITH URETHRAL DILATATION  10/23/2013  . CYSTOSCOPY WITH URETHRAL DILATATION N/A 10/23/2013   Procedure: CYSTOSCOPY WITH URETHRAL DILATATION;  Surgeon: Kathi Ludwig, MD;  Location: [facility] OR;  Service: Urology;  Laterality: N/A;  . INCISION AND DRAINAGE OF WOUND Right 1980's   "cat scratch" (10/23/2013)  . INGUINAL HERNIA REPAIR Right 1950's  . KIDNEY STONE SURGERY Right 1970's; 1983   "twice"  . TONSILLECTOMY AND ADENOIDECTOMY  1940's  . TOTAL HIP ARTHROPLASTY Left 10/23/2013  . TOTAL HIP ARTHROPLASTY Left 10/23/2013   Procedure: TOTAL HIP ARTHROPLASTY;  Surgeon: Valeria Batman, MD;  Location: [facility] OR;  Service: Orthopedics;  Laterality: Left;    Social History   Social History  . Marital status: Married    Spouse name: N/A  . Number of children: N/A  . Years of education: N/A   Occupational History  . Not on file.   Social History Main Topics  . Smoking status: Former Smoker    Packs/day: 1.00    Years: 15.00    Types: Cigarettes, Cigars  . Smokeless tobacco: Former Neurosurgeon     Comment: quit smoking ~ 40 yr ago  . Alcohol use No     Comment: rare beer.  . Drug use: No  . Sexual activity: Yes   Other Topics Concern  . Not on file   Social History Narrative   Lives in [place] with wife.  Does not routinely exercise.    Mobility: Without assistive devices Work history: Not obtained   Allergies  Allergen Reactions  . Lisinopril Swelling    Family History  Problem Relation Age of Onset  . Heart Problems Maternal Grandmother   . Other      negative for premature CAD     Prior to Admission medications   Medication Sig Start Date End Date Taking? Authorizing Provider  allopurinol  (ZYLOPRIM) 100 MG tablet Take 200 mg by mouth daily.   Yes Historical Provider, MD  aspirin EC 81 MG tablet Take 81 mg by mouth daily.   Yes Historical Provider, MD  carvedilol (COREG) 12.5 MG tablet Take 12.5 mg by mouth 2 (two) times daily with a meal.   Yes Historical Provider, MD  cyanocobalamin 1000 MCG tablet Take 1,000 mcg by mouth daily.    Yes Historical Provider, MD  diphenhydrAMINE (BENADRYL) 25 mg capsule Take 25 mg by mouth at bedtime as needed for sleep.   Yes Historical Provider, MD  finasteride (PROSCAR) 5 MG tablet Take 5 mg by mouth daily.   Yes Historical Provider, MD  furosemide (LASIX) 40 MG tablet Take 1 tablet (40 mg total) by mouth 2 (two) times daily. 05/31/16  Yes Janetta Hora, PA-C  glipiZIDE (GLUCOTROL) 5 MG tablet Take 5 mg by mouth daily before breakfast.   Yes Historical Provider, MD  hydrALAZINE (APRESOLINE) 25 MG tablet Take 25 mg by mouth 3 (three) times daily.   Yes Historical Provider, MD  HYDROcodone-acetaminophen (NORCO/VICODIN) 5-325 MG tablet Take 2 tablets by mouth every 4 (  four) hours as needed. Patient taking differently: Take 1 tablet by mouth 2 (two) times daily as needed (for back pain).  04/09/16  Yes Isa Rankin, MD  isosorbide dinitrate (DILATRATE-SR) 40 MG CR capsule Take 40 mg by mouth 3 (three) times daily.   Yes Historical Provider, MD  ketoconazole (NIZORAL) 2 % shampoo Apply 1 application topically 2 (two) times a week.   Yes Historical Provider, MD  sertraline (ZOLOFT) 25 MG tablet Take 50 mg by mouth daily as needed (for anxiety).    Yes Historical Provider, MD  simvastatin (ZOCOR) 80 MG tablet Take 40 mg by mouth daily.   Yes Historical Provider, MD  spironolactone (ALDACTONE) 25 MG tablet Take 25 mg by mouth daily.   Yes Historical Provider, MD  tamsulosin (FLOMAX) 0.4 MG CAPS capsule Take 0.4 mg by mouth at bedtime.   Yes Historical Provider, MD  colchicine 0.6 MG tablet Take 1 tablet (0.6 mg total) by mouth daily. Patient not taking:  Reported on 11/30/2016 04/09/16 11/30/16  Isa Rankin, MD  oxyCODONE (ROXICODONE) 5 MG immediate release tablet Take 1 tablet (5 mg total) by mouth every 6 (six) hours as needed for breakthrough pain. Patient not taking: Reported on 11/30/2016 09/10/16   Lyndal Pulley, MD    Physical Exam: Vitals:   02/03/17 1046 02/03/17 1100 02/03/17 1115 02/03/17 1200  BP: 158/75 105/63 110/67 102/66  Pulse: 103 85 86 81  Resp: (!) 35 21 21 20   SpO2: 97% 97% 98% 100%      Constitutional: NAD, calm, comfortable Eyes: PERRL, lids and conjunctivae normal ENMT: Mucous membranes are moist. Posterior pharynx clear of any exudate or lesions. Neck: normal, supple, no masses, no thyromegaly Respiratory: Diminished throughout with bilateral expiratory crackles, Normal respiratory effort without accessory muscle use at rest. BiPAP: FiO2 70%  Cardiovascular: Regular rate and rhythm, no murmurs / rubs / gallops. No extremity edema. 2+ pedal pulses. No carotid bruits. 6-7 cm bilateral JVD Abdomen: no tenderness, no masses palpated. No hepatosplenomegaly. Bowel sounds positive.  Musculoskeletal: no clubbing / cyanosis. No joint deformity upper and lower extremities. Good ROM, no contractures. Normal muscle tone.  Skin: no rashes, lesions, ulcers. No induration Neurologic: CN 2-12 grossly intact. Sensation intact, DTR normal. Strength 5/5 x all 4 extremities.  Psychiatric: Normal judgment and insight. Alert and oriented x 3. Normal mood.    Labs on Admission: I have personally reviewed following labs and imaging studies  CBC:  Recent Labs Lab 02/03/17 1024 02/03/17 1044  WBC 9.7  --   NEUTROABS 5.7  --   HGB 13.3 13.9  HCT 40.1 41.0  MCV 94.6  --   PLT 248  --    Basic Metabolic Panel:  Recent Labs Lab 02/03/17 1044  NA 140  K 4.7  CL 105  GLUCOSE 243*  BUN 29*  CREATININE 1.20   GFR: CrCl cannot be calculated (Unknown ideal weight.). Liver Function Tests: No results for input(s): AST, ALT,  ALKPHOS, BILITOT, PROT, ALBUMIN in the last 168 hours. No results for input(s): LIPASE, AMYLASE in the last 168 hours. No results for input(s): AMMONIA in the last 168 hours. Coagulation Profile: No results for input(s): INR, PROTIME in the last 168 hours. Cardiac Enzymes: No results for input(s): CKTOTAL, CKMB, CKMBINDEX, TROPONINI in the last 168 hours. BNP (last 3 results) No results for input(s): PROBNP in the last 8760 hours. HbA1C: No results for input(s): HGBA1C in the last 72 hours. CBG: No results for input(s): GLUCAP in  the last 168 hours. Lipid Profile: No results for input(s): CHOL, HDL, LDLCALC, TRIG, CHOLHDL, LDLDIRECT in the last 72 hours. Thyroid Function Tests: No results for input(s): TSH, T4TOTAL, FREET4, T3FREE, THYROIDAB in the last 72 hours. Anemia Panel: No results for input(s): VITAMINB12, FOLATE, FERRITIN, TIBC, IRON, RETICCTPCT in the last 72 hours. Urine analysis:    Component Value Date/Time   COLORURINE YELLOW 05/20/2016 0301   APPEARANCEUR CLEAR 05/20/2016 0301   LABSPEC 1.012 05/20/2016 0301   PHURINE 5.0 05/20/2016 0301   GLUCOSEU NEGATIVE 05/20/2016 0301   HGBUR TRACE (A) 05/20/2016 0301   BILIRUBINUR NEGATIVE 05/20/2016 0301   KETONESUR NEGATIVE 05/20/2016 0301   PROTEINUR NEGATIVE 05/20/2016 0301   UROBILINOGEN 0.2 05/26/2015 1521   NITRITE POSITIVE (A) 05/20/2016 0301   LEUKOCYTESUR MODERATE (A) 05/20/2016 0301   Sepsis Labs: @LABRCNTIP (procalcitonin:4,lacticidven:4) )No results found for this or any previous visit (from the past 240 hour(s)).   Radiological Exams on Admission: Dg Chest Portable 1 View  Result Date: 02/03/2017 CLINICAL DATA:  Shortness of breath for the past 30 minutes. EXAM: PORTABLE CHEST 1 VIEW COMPARISON:  PA and lateral chest 01/15/2017.  CT chest 11/30/2016. FINDINGS: Dense airspace opacity is seen in the left mid lung zone. Right lung is clear. There is cardiomegaly without edema. No pneumothorax. Atherosclerosis is  seen. AICD is in place. IMPRESSION: Dense airspace opacity left mid lung zone worrisome for pneumonia. Cardiomegaly. Atherosclerosis. Electronically Signed   By: Drusilla Kanner M.D.   On: 02/03/2017 10:51    EKG: (Independently reviewed) Atrial fibrillation with nonspecific IVCD, ventricular rate 108 bpm, QTC 449 ms, no definitive ischemic changes  Assessment/Plan Principal Problem:   Acute on chronic systolic heart failure, NYHA class 2/ Nonischemic cardiomyopathy  -Patient presents with abrupt onset of shortness of breath with hypoxemia at home room air saturations 80% and mild hypertension demonstrated chest x-ray findings consistent with heart failure as well as possible superimposed pneumonia and elevated BNP 375 -Denies dietary indiscretion or recent changes in cardiac medications- last evaluated by [facility]ologist at [facility] 1 week ago -Lasix 80 mg IV q 12hrs -Not on ARB readmission secondary to allergy -Continue carvedilol -Continue hydralazine and isosorbide dinitrate -Echocardiogram June 2017: EF 25-30% with increased LVEDP -Daily weights, strict I/O  Active Problems:   Acute respiratory failure with hypoxia  -Appears to be multifactorial: Heart failure and possible pneumonia -Treat underlying causes -Continue supportive care -Wean oxygen as tolerated    CAP (community acquired pneumonia) -Dense opacity mid left lung zone concerning for possible pneumonia -Empiric Zithromax/Rocephin -Repeat x-ray in a.m.-if demonstrates clearing less likely airspace opacities related to pneumonia and more likely related to asymmetric edema -Blood cultures and sputum culture -Urinary strep -HIV antibody per protocol    Essential hypertension -Moderate control -Attending medications as listed above     Atrial fibrillation  -New diagnosis according to patient -[facility] planned on starting a twice a day blood thinner (not Xarelto-bled into knee?) but patient has not started yet since he  has not attended appropriate educational classes through the [place] -Plan is to start anticoagulation with Eliquis with pharmacy dosing and performing initial education -Currently rate controlled-denies palpitations    Diabetes mellitus type 2 in obese  -Holding home OHA while NPO on BiPAP -Follow CBGs -SSRI -HgbA1c    Obesity (BMI 30-39.9)    Hyperlipidemia -Continue Lipitor    ICD (implantable cardioverter-defibrillator) in place    Depression -Zoloft    Gout -Allopurinol      DVT prophylaxis: Eliquis Code  Status: Full Family Communication: Wife Disposition Plan: Home Consults called: None    Martena Emanuele L. ANP-BC [facility] Pager [phone]   If 7PM-7AM, please contact night-coverage [email or web address] Password [email or web address]  02/03/2017, 1:11 PM

## 2017-02-03 NOTE — Progress Notes (Signed)
Pt taken off bipap and placed on venti mask at 45%, SAT 97%, breathing comfortably at this time. RT will continue to monitor.

## 2017-02-04 ENCOUNTER — Inpatient Hospital Stay (HOSPITAL_COMMUNITY): Payer: Medicare Other

## 2017-02-04 LAB — BLOOD CULTURE ID PANEL (REFLEXED)
Acinetobacter baumannii: NOT DETECTED
CANDIDA GLABRATA: NOT DETECTED
CANDIDA TROPICALIS: NOT DETECTED
Candida albicans: NOT DETECTED
Candida krusei: NOT DETECTED
Candida parapsilosis: NOT DETECTED
ENTEROBACTER CLOACAE COMPLEX: NOT DETECTED
ENTEROBACTERIACEAE SPECIES: NOT DETECTED
Enterococcus species: NOT DETECTED
Escherichia coli: NOT DETECTED
HAEMOPHILUS INFLUENZAE: NOT DETECTED
KLEBSIELLA PNEUMONIAE: NOT DETECTED
Klebsiella oxytoca: NOT DETECTED
LISTERIA MONOCYTOGENES: NOT DETECTED
METHICILLIN RESISTANCE: NOT DETECTED
NEISSERIA MENINGITIDIS: NOT DETECTED
PROTEUS SPECIES: NOT DETECTED
Pseudomonas aeruginosa: NOT DETECTED
STAPHYLOCOCCUS SPECIES: DETECTED — AB
STREPTOCOCCUS SPECIES: NOT DETECTED
Serratia marcescens: NOT DETECTED
Staphylococcus aureus (BCID): NOT DETECTED
Streptococcus agalactiae: NOT DETECTED
Streptococcus pneumoniae: NOT DETECTED
Streptococcus pyogenes: NOT DETECTED

## 2017-02-04 LAB — BASIC METABOLIC PANEL
Anion gap: 10 (ref 5–15)
BUN: 19 mg/dL (ref 6–20)
CALCIUM: 8.8 mg/dL — AB (ref 8.9–10.3)
CO2: 29 mmol/L (ref 22–32)
CREATININE: 1.22 mg/dL (ref 0.61–1.24)
Chloride: 100 mmol/L — ABNORMAL LOW (ref 101–111)
GFR calc Af Amer: >60 mL/min (ref >60)
GFR, EST NON AFRICAN AMERICAN: 54 mL/min — AB (ref >60)
GLUCOSE: 124 mg/dL — AB (ref 65–99)
Potassium: 3.4 mmol/L — ABNORMAL LOW (ref 3.5–5.1)
Sodium: 139 mmol/L (ref 135–145)

## 2017-02-04 LAB — GLUCOSE, CAPILLARY
GLUCOSE-CAPILLARY: 117 mg/dL — AB (ref 65–99)
Glucose-Capillary: 110 mg/dL — ABNORMAL HIGH (ref 65–99)
Glucose-Capillary: 140 mg/dL — ABNORMAL HIGH (ref 65–99)
Glucose-Capillary: 180 mg/dL — ABNORMAL HIGH (ref 65–99)
Glucose-Capillary: 110 mg/dL — ABNORMAL HIGH (ref 65–99)

## 2017-02-04 LAB — HIV ANTIBODY (ROUTINE TESTING W REFLEX): HIV Screen 4th Generation wRfx: NONREACTIVE

## 2017-02-04 LAB — HEMOGLOBIN A1C
HEMOGLOBIN A1C: 6.3 % — AB (ref 4.8–5.6)
Mean Plasma Glucose: 134 mg/dL

## 2017-02-04 NOTE — Progress Notes (Signed)
PROGRESS NOTE    Ryan Macdonald  ZOX:[id number] DOB: [birth year] DOA: 02/03/2017 PCP: [facility]   Brief Narrative:  83 year old African-American male with history of dilated nonischemic cardiomyopathy with congestive heart failure ejection fraction 25-30% came to the ED with complaints of shortness of breath. He was found to be in acute hypoxic respiratory failure likely due to acute systolic CHF with superimposed pneumonia. He was started on IV diuretics, placed on BiPAP and started on IV antibiotics.   Assessment & Plan:   Principal Problem:   Acute on chronic systolic heart failure, NYHA class 2 (HCC) Active Problems:   Nonischemic cardiomyopathy (HCC)   Obesity (BMI 30-39.9)   Essential hypertension   Hyperlipidemia   Atrial fibrillation (HCC)   Acute respiratory failure with hypoxia (HCC)   ICD (implantable cardioverter-defibrillator) in place   Depression   Gout   Diabetes mellitus type 2 in obese (HCC)   CAP (community acquired pneumonia)      Acute respiratory failure with hypoxia -improved -Appears to be multifactorial: Heart failure and possible pneumonia -Treat underlying causes -Continue supportive care -Wean oxygen as tolerated -Continue diuretics and IV antibiotics as stated below.   Acute on chronic systolic heart failure, NYHA class 2/ Nonischemic cardiomyopathy  -Patient presents with abrupt onset of shortness of breath with hypoxemia at home room air saturations 80% and mild hypertension demonstrated chest x-ray findings consistent with heart failure as well as possible superimposed pneumonia and elevated BNP 375 -Denies dietary indiscretion or recent changes in cardiac medications- last evaluated by cardiologist at [facility] 1 week ago -Lasix 80 mg IV q 12hrs -Not on ARB readmission secondary to allergy -Continue carvedilol -Continue hydralazine and isosorbide dinitrate -Echocardiogram June 2017: EF 25-30% with increased LVEDP. Repeat echocardiogram  ordered -Daily weights, strict I/O     CAP (community acquired pneumonia) -Dense opacity mid left lung zone concerning for possible pneumonia -Empiric Zithromax/Rocephin -Blood cultures and sputum culture -Urinary strep-negative -HIV antibody per protocol-negative    Essential hypertension -Moderate control -Attending medications as listed above     Atrial fibrillation -on pelvic was -New diagnosis according to patient -[facility] planned on starting a twice a day blood thinner (not Xarelto-bled into knee?) but patient has not started yet since he has not attended appropriate educational classes through the [place] -Eliquis started, case management to given him samples. -Currently rate controlled-denies palpitations    Diabetes mellitus type 2 in obese  -Currently his A1c is 6.3 -Continue sliding scale    Obesity (BMI 30-39.9)    Hyperlipidemia -Continue Lipitor    ICD (implantable cardioverter-defibrillator) in place    Depression -Zoloft    Gout -Allopurinol      DVT prophylaxis: Eliquis Code Status: Full Family Communication: Wife Disposition Plan:  continue to wean off oxygen at this time. If his oxygen requirements decreased tomorrow he'll be transferred to telemetry floor for further care. Likely discharge in next 48 hours. Consults called: None   Antimicrobials:   Rocephin and azithromycin 3/9   Subjective: Patient states he feels a whole lot better than yesterday but still little short of breath with movement. Denies any chest pain, abdominal pain, nausea and vomiting. No other complaints.  Objective: Vitals:   02/04/17 1129 02/04/17 1130 02/04/17 1140 02/04/17 1200  BP: 125/81 125/81 125/81   Pulse: 89  84 89  Resp:   (!) 25 15  Temp:   97.7 F (36.5 C)   TempSrc:   Oral   SpO2:   96% 95%  Weight:      Height:        Intake/Output Summary (Last 24 hours) at 02/04/17 1312 Last data filed at 02/04/17 1134  Gross per 24 hour    Intake              590 ml  Output             3575 ml  Net            -2985 ml   Filed Weights   02/03/17 1700  Weight: 72.6 kg (160 lb)    Examination:  General exam: Appears calm and comfortable  Respiratory system: Bibasilar crackles.  Cardiovascular system: S1 & S2 heard, RRR. No JVD, murmurs, rubs, gallops or clicks. No pedal edema. Gastrointestinal system: Abdomen is nondistended, soft and nontender. No organomegaly or masses felt. Normal bowel sounds heard. Central nervous system: Alert and oriented. No focal neurological deficits. Extremities: Symmetric 5 x 5 power. Skin: No rashes, lesions or ulcers Psychiatry: Judgement and insight appear normal. Mood & affect appropriate.     Data Reviewed:   CBC:  Recent Labs Lab 02/03/17 1024 02/03/17 1044  WBC 9.7  --   NEUTROABS 5.7  --   HGB 13.3 13.9  HCT 40.1 41.0  MCV 94.6  --   PLT 248  --    Basic Metabolic Panel:  Recent Labs Lab 02/03/17 1044 02/04/17 0229  NA 140 139  K 4.7 3.4*  CL 105 100*  CO2  --  29  GLUCOSE 243* 124*  BUN 29* 19  CREATININE 1.20 1.22  CALCIUM  --  8.8*   GFR: Estimated Creatinine Clearance: 46.7 mL/min (by C-G formula based on SCr of 1.22 mg/dL). Liver Function Tests: No results for input(s): AST, ALT, ALKPHOS, BILITOT, PROT, ALBUMIN in the last 168 hours. No results for input(s): LIPASE, AMYLASE in the last 168 hours. No results for input(s): AMMONIA in the last 168 hours. Coagulation Profile: No results for input(s): INR, PROTIME in the last 168 hours. Cardiac Enzymes: No results for input(s): CKTOTAL, CKMB, CKMBINDEX, TROPONINI in the last 168 hours. BNP (last 3 results) No results for input(s): PROBNP in the last 8760 hours. HbA1C:  Recent Labs  02/03/17 1509  HGBA1C 6.3*   CBG:  Recent Labs Lab 02/03/17 1610 02/03/17 1949 02/04/17 0351  GLUCAP 110* 116* 117*   Lipid Profile: No results for input(s): CHOL, HDL, LDLCALC, TRIG, CHOLHDL, LDLDIRECT in  the last 72 hours. Thyroid Function Tests: No results for input(s): TSH, T4TOTAL, FREET4, T3FREE, THYROIDAB in the last 72 hours. Anemia Panel: No results for input(s): VITAMINB12, FOLATE, FERRITIN, TIBC, IRON, RETICCTPCT in the last 72 hours. Sepsis Labs:  Recent Labs Lab 02/03/17 1238 02/03/17 1509 02/03/17 1549  PROCALCITON  --  0.12  --   LATICACIDVEN 2.80*  --  1.64    Recent Results (from the past 240 hour(s))  Blood culture (routine x 2)     Status: None (Preliminary result)   Collection Time: 02/03/17 11:55 AM  Result Value Ref Range Status   Specimen Description BLOOD LEFT ANTECUBITAL  Final   Special Requests BOTTLES DRAWN AEROBIC AND ANAEROBIC  3CC  Final   Culture  Setup Time   Final    GRAM POSITIVE COCCI IN CLUSTERS AEROBIC BOTTLE ONLY Organism ID to follow CRITICAL RESULT CALLED TO, READ BACK BY AND VERIFIED WITH: MMayford Knife Pharm.D. 10:45 02/04/17 ([email or web address])    Culture GRAM POSITIVE COCCI  Final   Report Status PENDING  Incomplete  Blood Culture ID Panel (Reflexed)     Status: Abnormal   Collection Time: 02/03/17 11:55 AM  Result Value Ref Range Status   Enterococcus species NOT DETECTED NOT DETECTED Final   Listeria monocytogenes NOT DETECTED NOT DETECTED Final   Staphylococcus species DETECTED (A) NOT DETECTED Final    Comment: Methicillin (oxacillin) susceptible coagulase negative staphylococcus. Possible blood culture contaminant (unless isolated from more than one blood culture draw or clinical case suggests pathogenicity). No antibiotic treatment is indicated for blood  culture contaminants. CRITICAL RESULT CALLED TO, READ BACK BY AND VERIFIED WITH: M. Mayford Knife Pharm.D. 10:45 02/04/17 ([email or web address])    Staphylococcus aureus NOT DETECTED NOT DETECTED Final   Methicillin resistance NOT DETECTED NOT DETECTED Final   Streptococcus species NOT DETECTED NOT DETECTED Final   Streptococcus agalactiae NOT DETECTED NOT DETECTED Final   Streptococcus pneumoniae NOT  DETECTED NOT DETECTED Final   Streptococcus pyogenes NOT DETECTED NOT DETECTED Final   Acinetobacter baumannii NOT DETECTED NOT DETECTED Final   Enterobacteriaceae species NOT DETECTED NOT DETECTED Final   Enterobacter cloacae complex NOT DETECTED NOT DETECTED Final   Escherichia coli NOT DETECTED NOT DETECTED Final   Klebsiella oxytoca NOT DETECTED NOT DETECTED Final   Klebsiella pneumoniae NOT DETECTED NOT DETECTED Final   Proteus species NOT DETECTED NOT DETECTED Final   Serratia marcescens NOT DETECTED NOT DETECTED Final   Haemophilus influenzae NOT DETECTED NOT DETECTED Final   Neisseria meningitidis NOT DETECTED NOT DETECTED Final   Pseudomonas aeruginosa NOT DETECTED NOT DETECTED Final   Candida albicans NOT DETECTED NOT DETECTED Final   Candida glabrata NOT DETECTED NOT DETECTED Final   Candida krusei NOT DETECTED NOT DETECTED Final   Candida parapsilosis NOT DETECTED NOT DETECTED Final   Candida tropicalis NOT DETECTED NOT DETECTED Final  Blood culture (routine x 2)     Status: None (Preliminary result)   Collection Time: 02/03/17 12:00 PM  Result Value Ref Range Status   Specimen Description BLOOD LEFT WRIST  Final   Special Requests BOTTLES DRAWN AEROBIC AND ANAEROBIC  3CC  Final   Culture NO GROWTH < 24 HOURS  Final   Report Status PENDING  Incomplete  MRSA PCR Screening     Status: None   Collection Time: 02/03/17  5:22 PM  Result Value Ref Range Status   MRSA by PCR NEGATIVE NEGATIVE Final    Comment:        The GeneXpert MRSA Assay (FDA approved for NASAL specimens only), is one component of a comprehensive MRSA colonization surveillance program. It is not intended to diagnose MRSA infection nor to guide or monitor treatment for MRSA infections.          Radiology Studies: Dg Chest 2 View  Result Date: 02/04/2017 CLINICAL DATA:  Acute respiratory failure EXAM: CHEST  2 VIEW COMPARISON:  02/03/2017 FINDINGS: Cardiac shadow is enlarged. Defibrillator is  again noted and stable. The lungs are well aerated bilaterally. The previously seen density on the left has resolved and was likely related to overlying soft tissue density. No new focal abnormality is seen. IMPRESSION: No active cardiopulmonary disease. Electronically Signed   By: Alcide Clever M.D.   On: 02/04/2017 09:39   Dg Chest Portable 1 View  Result Date: 02/03/2017 CLINICAL DATA:  Shortness of breath for the past 30 minutes. EXAM: PORTABLE CHEST 1 VIEW COMPARISON:  PA and lateral chest 01/15/2017.  CT chest 11/30/2016. FINDINGS: Dense airspace opacity is seen in the left mid lung zone. Right lung  is clear. There is cardiomegaly without edema. No pneumothorax. Atherosclerosis is seen. AICD is in place. IMPRESSION: Dense airspace opacity left mid lung zone worrisome for pneumonia. Cardiomegaly. Atherosclerosis. Electronically Signed   By: Drusilla Kanner M.D.   On: 02/03/2017 10:51        Scheduled Meds: . allopurinol  200 mg Oral Daily  . apixaban  5 mg Oral BID  . aspirin EC  81 mg Oral Daily  . atorvastatin  40 mg Oral q1800  . azithromycin  500 mg Intravenous Q24H  . carvedilol  12.5 mg Oral BID WC  . cefTRIAXone (ROCEPHIN)  IV  1 g Intravenous Once  . cefTRIAXone (ROCEPHIN)  IV  1 g Intravenous Q24H  . finasteride  5 mg Oral Daily  . furosemide  80 mg Intravenous Q12H  . hydrALAZINE  25 mg Oral TID  . insulin aspart  0-20 Units Subcutaneous TID AC & HS  . isosorbide dinitrate  40 mg Oral TID  . mouth rinse  15 mL Mouth Rinse BID  . potassium chloride  20 mEq Oral BID  . sodium chloride flush  3 mL Intravenous Q12H  . tamsulosin  0.4 mg Oral QHS  . cyanocobalamin  1,000 mcg Oral Daily   Continuous Infusions:   LOS: 1 day    Time spent: 35 mins     Nargis Abrams Joline Maxcy, MD [facility] Pager [phone]   If 7PM-7AM, please contact night-coverage [email or web address] Password [email or web address] 02/04/2017, 1:12 PM

## 2017-02-04 NOTE — Discharge Instructions (Signed)

## 2017-02-04 NOTE — Progress Notes (Signed)
Notified Infection Prevention regarding Blood Culture ID Panel (Staphyloccus species detected).  Per Tammy, IP there is no need to place patient on precautions.

## 2017-02-04 NOTE — Care Management Note (Signed)
Case Management Note  Patient Details  Name: Ryan Macdonald MRN: [id number] Date of Birth: Feb 15, 1934  Subjective/Objective:           Pt admitted with resp failure and HCAP         Action/Plan:   PTA from home independent with wife.  [facility] [place] - [address] [place].  CM provided free 30 day card to pt - Pt stated his preferred pharmacy outside of the [place] is [facility] on [address] - CM verified pharmacy can fill prescription.   and informed pt that he is to call the [place] upon discharge and inform of admit and request follow up appt ASAP so he can be seen prior to free 30 day card supply being depleted.  CM will continue to follow for discharge   Expected Discharge Date:                  Expected Discharge Plan:  Home/Self Care  In-House Referral:     Discharge planning Services  CM Consult, Medication Assistance  Post Acute Care Choice:    Choice offered to:     DME Arranged:    DME Agency:     HH Arranged:    HH Agency:     Status of Service:  In process, will continue to follow  If discussed at Long Length of Stay Meetings, dates discussed:    Additional Comments:  Cherylann Parr, RN 02/04/2017, 11:06 AM

## 2017-02-04 NOTE — Progress Notes (Signed)
PHARMACY - PHYSICIAN COMMUNICATION CRITICAL VALUE ALERT - BLOOD CULTURE IDENTIFICATION (BCID)  Results for orders placed or performed during the hospital encounter of 02/03/17  Blood Culture ID Panel (Reflexed) (Collected: 02/03/2017 11:55 AM)  Result Value Ref Range   Enterococcus species NOT DETECTED NOT DETECTED   Listeria monocytogenes NOT DETECTED NOT DETECTED   Staphylococcus species DETECTED (A) NOT DETECTED   Staphylococcus aureus NOT DETECTED NOT DETECTED   Methicillin resistance NOT DETECTED NOT DETECTED   Streptococcus species NOT DETECTED NOT DETECTED   Streptococcus agalactiae NOT DETECTED NOT DETECTED   Streptococcus pneumoniae NOT DETECTED NOT DETECTED   Streptococcus pyogenes NOT DETECTED NOT DETECTED   Acinetobacter baumannii NOT DETECTED NOT DETECTED   Enterobacteriaceae species NOT DETECTED NOT DETECTED   Enterobacter cloacae complex NOT DETECTED NOT DETECTED   Escherichia coli NOT DETECTED NOT DETECTED   Klebsiella oxytoca NOT DETECTED NOT DETECTED   Klebsiella pneumoniae NOT DETECTED NOT DETECTED   Proteus species NOT DETECTED NOT DETECTED   Serratia marcescens NOT DETECTED NOT DETECTED   Haemophilus influenzae NOT DETECTED NOT DETECTED   Neisseria meningitidis NOT DETECTED NOT DETECTED   Pseudomonas aeruginosa NOT DETECTED NOT DETECTED   Candida albicans NOT DETECTED NOT DETECTED   Candida glabrata NOT DETECTED NOT DETECTED   Candida krusei NOT DETECTED NOT DETECTED   Candida parapsilosis NOT DETECTED NOT DETECTED   Candida tropicalis NOT DETECTED NOT DETECTED    83 year old male admitted with CHF exacerbation and possible CAP. He is on empiric therapy with Ceftriaxone and Azithromycin. He has 1/2 Blood cultures with gram positive cocci in clusters and Staphylococcus species is detected on BCID. This is likely a contaminant.  It is not methicillin-resistant, so it would be covered by Ceftriaxone if it happens to be a true infection.  Name of physician (or  Provider) Contacted: Dr. Nelson Chimes via [facility] text page  Changes to prescribed antibiotics required: Continue Ceftriaxone and Azithromycin for pneumonia.  Sallee Provencal 02/04/2017  10:48 AM

## 2017-02-05 ENCOUNTER — Other Ambulatory Visit (HOSPITAL_COMMUNITY): Payer: Medicare Other

## 2017-02-05 ENCOUNTER — Inpatient Hospital Stay (HOSPITAL_COMMUNITY): Payer: Medicare Other

## 2017-02-05 DIAGNOSIS — I509 Heart failure, unspecified: Secondary | ICD-10-CM

## 2017-02-05 LAB — BASIC METABOLIC PANEL
Anion gap: 9 (ref 5–15)
BUN: 23 mg/dL — AB (ref 6–20)
CHLORIDE: 101 mmol/L (ref 101–111)
CO2: 30 mmol/L (ref 22–32)
Calcium: 9.3 mg/dL (ref 8.9–10.3)
Creatinine, Ser: 1.34 mg/dL — ABNORMAL HIGH (ref 0.61–1.24)
GFR calc Af Amer: 56 mL/min — ABNORMAL LOW (ref >60)
GFR calc non Af Amer: 48 mL/min — ABNORMAL LOW (ref >60)
GLUCOSE: 116 mg/dL — AB (ref 65–99)
POTASSIUM: 3.8 mmol/L (ref 3.5–5.1)
Sodium: 140 mmol/L (ref 135–145)

## 2017-02-05 LAB — GLUCOSE, CAPILLARY
GLUCOSE-CAPILLARY: 109 mg/dL — AB (ref 65–99)
GLUCOSE-CAPILLARY: 143 mg/dL — AB (ref 65–99)
GLUCOSE-CAPILLARY: 110 mg/dL — AB (ref 65–99)
GLUCOSE-CAPILLARY: 159 mg/dL — AB (ref 65–99)
GLUCOSE-CAPILLARY: 125 mg/dL — AB (ref 65–99)
Glucose-Capillary: 113 mg/dL — ABNORMAL HIGH (ref 65–99)
Glucose-Capillary: 126 mg/dL — ABNORMAL HIGH (ref 65–99)

## 2017-02-05 LAB — ECHOCARDIOGRAM COMPLETE
Height: 68 in
WEIGHTICAEL: 4131.2 [oz_av]

## 2017-02-05 LAB — PROCALCITONIN: Procalcitonin: 0.15 ng/mL

## 2017-02-05 MED ORDER — SENNA 8.6 MG PO TABS
1.0000 | ORAL_TABLET | Freq: Every day | ORAL | Status: DC | PRN
  Administered 2017-02-05: 8.6 mg via ORAL
  Filled 2017-02-05: qty 1

## 2017-02-05 MED ORDER — MAGNESIUM HYDROXIDE 400 MG/5ML PO SUSP: 30.0000 mL | Freq: Every day | ORAL | Status: DC | PRN

## 2017-02-05 MED ORDER — FUROSEMIDE 40 MG PO TABS
40.0000 mg | ORAL_TABLET | Freq: Two times a day (BID) | ORAL | Status: DC
  Administered 2017-02-05 – 2017-02-06 (×2): 40 mg via ORAL
  Filled 2017-02-05 (×2): qty 1

## 2017-02-05 MED ORDER — DOCUSATE SODIUM 100 MG PO CAPS: 100.0000 mg | ORAL_CAPSULE | Freq: Every day | ORAL | Status: DC | PRN

## 2017-02-05 NOTE — Progress Notes (Signed)
PROGRESS NOTE    Ryan Macdonald  ZOX:[id number] DOB: [birth year] DOA: 02/03/2017 PCP: [facility]   Brief Narrative:  83 year old African-American male with history of dilated nonischemic cardiomyopathy with congestive heart failure ejection fraction 25-30% came to the ED with complaints of shortness of breath. He was found to be in acute hypoxic respiratory failure likely due to acute systolic CHF with superimposed pneumonia. He was started on IV diuretics, placed on BiPAP and started on IV antibiotics.   Assessment & Plan:   Principal Problem:   Acute on chronic systolic heart failure, NYHA class 2 (HCC) Active Problems:   Nonischemic cardiomyopathy (HCC)   Obesity (BMI 30-39.9)   Essential hypertension   Hyperlipidemia   Atrial fibrillation (HCC)   Acute respiratory failure with hypoxia (HCC)   ICD (implantable cardioverter-defibrillator) in place   Depression   Gout   Diabetes mellitus type 2 in obese (HCC)   CAP (community acquired pneumonia)      Acute respiratory failure with hypoxia -improved -Appears to be multifactorial: Heart failure and possible pneumonia -Treat underlying causes -Continue supportive care -Wean oxygen as tolerated -Continue diuretics and IV antibiotics as stated below.   Acute on chronic systolic heart failure, NYHA class 2/ Nonischemic cardiomyopathy  -Patient presents with abrupt onset of shortness of breath with hypoxemia at home room air saturations 80% and mild hypertension demonstrated chest x-ray findings consistent with heart failure as well as possible superimposed pneumonia and elevated BNP 375 -Denies dietary indiscretion or recent changes in cardiac medications- last evaluated by cardiologist at [facility] 1 week ago -Lasix 80 mg IV q 12hrs discontinued. Started back on home Po regimen.  -Not on ARB readmission secondary to allergy -Continue carvedilol -Continue hydralazine and isosorbide dinitrate -Echocardiogram June 2017: EF  25-30% with increased LVEDP. Repeat echocardiogram ordered - still pending.  -Daily weights, strict I/O    CAP (community acquired pneumonia) -Dense opacity mid left lung zone concerning for possible pneumonia -Empiric Zithromax/Rocephin -Blood cultures and sputum culture -Urinary strep-negative -HIV antibody per protocol-negative    Essential hypertension -Moderate control -Attending medications as listed above    Atrial fibrillation -on pelvic was -New diagnosis according to patient -[facility] planned on starting a twice a day blood thinner (not Xarelto-bled into knee?) but patient has not started yet since he has not attended appropriate educational classes through the [place] -Eliquis started, case management to given him samples. -Currently rate controlled-denies palpitations    Diabetes mellitus type 2 in obese  -Currently his A1c is 6.3 -Continue sliding scale    Obesity (BMI 30-39.9)    Hyperlipidemia -Continue Lipitor    ICD (implantable cardioverter-defibrillator) in place    Depression -Zoloft    Gout -Allopurinol   DVT prophylaxis: Eliquis Code Status: Full Family Communication: Wife Disposition Plan:  echocardiogram and physical therapy eval is pending at this time. If continues to do well tomorrow he could possibly be discharged. Transfer to telemetry floor. Consults called: None   Antimicrobials:   Rocephin and azithromycin 3/9   Subjective: Patient doesn't have any complaints this morning. He's tolerating oral diet. He has not walked around much therefore does not know if he will get short of breath with exertion. Waiting on physical therapy see the patient.  Objective: Vitals:   02/04/17 2355 02/05/17 0353 02/05/17 0700 02/05/17 1100  BP: 121/76 (!) 157/110 (!) 96/57 100/73  Pulse: 91 89 91 89  Resp:  18 20 (!) 28  Temp: 98.4 F (36.9 C) 97.8 F (36.6  C) 98.2 F (36.8 C) 98.2 F (36.8 C)  TempSrc: Oral Oral Oral Oral  SpO2:  93% 97% 96% 95%  Weight:  117.1 kg (258 lb 3.2 oz)    Height:        Intake/Output Summary (Last 24 hours) at 02/05/17 1245 Last data filed at 02/05/17 1100  Gross per 24 hour  Intake              410 ml  Output             3151 ml  Net            -2741 ml   Filed Weights   02/03/17 1700 02/05/17 0353  Weight: 72.6 kg (160 lb) 117.1 kg (258 lb 3.2 oz)    Examination:  General exam: Appears calm and comfortable  Respiratory system: Bibasilar crackles.  Cardiovascular system: S1 & S2 heard, RRR. No JVD, murmurs, rubs, gallops or clicks. No pedal edema. Gastrointestinal system: Abdomen is nondistended, soft and nontender. No organomegaly or masses felt. Normal bowel sounds heard. Central nervous system: Alert and oriented. No focal neurological deficits. Extremities: Symmetric 5 x 5 power. Skin: No rashes, lesions or ulcers Psychiatry: Judgement and insight appear normal. Mood & affect appropriate.     Data Reviewed:   CBC:  Recent Labs Lab 02/03/17 1024 02/03/17 1044  WBC 9.7  --   NEUTROABS 5.7  --   HGB 13.3 13.9  HCT 40.1 41.0  MCV 94.6  --   PLT 248  --    Basic Metabolic Panel:  Recent Labs Lab 02/03/17 1044 02/04/17 0229 02/05/17 0702  NA 140 139 140  K 4.7 3.4* 3.8  CL 105 100* 101  CO2  --  29 30  GLUCOSE 243* 124* 116*  BUN 29* 19 23*  CREATININE 1.20 1.22 1.34*  CALCIUM  --  8.8* 9.3   GFR: Estimated Creatinine Clearance: 54.7 mL/min (by C-G formula based on SCr of 1.34 mg/dL (H)). Liver Function Tests: No results for input(s): AST, ALT, ALKPHOS, BILITOT, PROT, ALBUMIN in the last 168 hours. No results for input(s): LIPASE, AMYLASE in the last 168 hours. No results for input(s): AMMONIA in the last 168 hours. Coagulation Profile: No results for input(s): INR, PROTIME in the last 168 hours. Cardiac Enzymes: No results for input(s): CKTOTAL, CKMB, CKMBINDEX, TROPONINI in the last 168 hours. BNP (last 3 results) No results for input(s):  PROBNP in the last 8760 hours. HbA1C:  Recent Labs  02/03/17 1509  HGBA1C 6.3*   CBG:  Recent Labs Lab 02/04/17 2101 02/05/17 0006 02/05/17 0356 02/05/17 0752 02/05/17 1133  GLUCAP 110* 126* 113* 109* 143*   Lipid Profile: No results for input(s): CHOL, HDL, LDLCALC, TRIG, CHOLHDL, LDLDIRECT in the last 72 hours. Thyroid Function Tests: No results for input(s): TSH, T4TOTAL, FREET4, T3FREE, THYROIDAB in the last 72 hours. Anemia Panel: No results for input(s): VITAMINB12, FOLATE, FERRITIN, TIBC, IRON, RETICCTPCT in the last 72 hours. Sepsis Labs:  Recent Labs Lab 02/03/17 1238 02/03/17 1509 02/03/17 1549 02/05/17 0702  PROCALCITON  --  0.12  --  0.15  LATICACIDVEN 2.80*  --  1.64  --     Recent Results (from the past 240 hour(s))  Blood culture (routine x 2)     Status: Abnormal (Preliminary result)   Collection Time: 02/03/17 11:55 AM  Result Value Ref Range Status   Specimen Description BLOOD LEFT ANTECUBITAL  Final   Special Requests BOTTLES DRAWN AEROBIC AND ANAEROBIC  3CC  Final   Culture  Setup Time   Final    GRAM POSITIVE COCCI IN CLUSTERS AEROBIC BOTTLE ONLY CRITICAL RESULT CALLED TO, READ BACK BY AND VERIFIED WITH: M. Mayford Knife Pharm.D. 10:45 02/04/17 ([email or web address])    Culture (A)  Final    STAPHYLOCOCCUS SPECIES (COAGULASE NEGATIVE) THE SIGNIFICANCE OF ISOLATING THIS ORGANISM FROM A SINGLE SET OF BLOOD CULTURES WHEN MULTIPLE SETS ARE DRAWN IS UNCERTAIN. PLEASE NOTIFY THE MICROBIOLOGY DEPARTMENT WITHIN ONE WEEK IF SPECIATION AND SENSITIVITIES ARE REQUIRED.    Report Status PENDING  Incomplete  Blood Culture ID Panel (Reflexed)     Status: Abnormal   Collection Time: 02/03/17 11:55 AM  Result Value Ref Range Status   Enterococcus species NOT DETECTED NOT DETECTED Final   Listeria monocytogenes NOT DETECTED NOT DETECTED Final   Staphylococcus species DETECTED (A) NOT DETECTED Final    Comment: Methicillin (oxacillin) susceptible coagulase negative  staphylococcus. Possible blood culture contaminant (unless isolated from more than one blood culture draw or clinical case suggests pathogenicity). No antibiotic treatment is indicated for blood  culture contaminants. CRITICAL RESULT CALLED TO, READ BACK BY AND VERIFIED WITH: M. Mayford Knife Pharm.D. 10:45 02/04/17 ([email or web address])    Staphylococcus aureus NOT DETECTED NOT DETECTED Final   Methicillin resistance NOT DETECTED NOT DETECTED Final   Streptococcus species NOT DETECTED NOT DETECTED Final   Streptococcus agalactiae NOT DETECTED NOT DETECTED Final   Streptococcus pneumoniae NOT DETECTED NOT DETECTED Final   Streptococcus pyogenes NOT DETECTED NOT DETECTED Final   Acinetobacter baumannii NOT DETECTED NOT DETECTED Final   Enterobacteriaceae species NOT DETECTED NOT DETECTED Final   Enterobacter cloacae complex NOT DETECTED NOT DETECTED Final   Escherichia coli NOT DETECTED NOT DETECTED Final   Klebsiella oxytoca NOT DETECTED NOT DETECTED Final   Klebsiella pneumoniae NOT DETECTED NOT DETECTED Final   Proteus species NOT DETECTED NOT DETECTED Final   Serratia marcescens NOT DETECTED NOT DETECTED Final   Haemophilus influenzae NOT DETECTED NOT DETECTED Final   Neisseria meningitidis NOT DETECTED NOT DETECTED Final   Pseudomonas aeruginosa NOT DETECTED NOT DETECTED Final   Candida albicans NOT DETECTED NOT DETECTED Final   Candida glabrata NOT DETECTED NOT DETECTED Final   Candida krusei NOT DETECTED NOT DETECTED Final   Candida parapsilosis NOT DETECTED NOT DETECTED Final   Candida tropicalis NOT DETECTED NOT DETECTED Final  Blood culture (routine x 2)     Status: None (Preliminary result)   Collection Time: 02/03/17 12:00 PM  Result Value Ref Range Status   Specimen Description BLOOD LEFT WRIST  Final   Special Requests BOTTLES DRAWN AEROBIC AND ANAEROBIC  3CC  Final   Culture NO GROWTH 2 DAYS  Final   Report Status PENDING  Incomplete  MRSA PCR Screening     Status: None   Collection Time:  02/03/17  5:22 PM  Result Value Ref Range Status   MRSA by PCR NEGATIVE NEGATIVE Final    Comment:        The GeneXpert MRSA Assay (FDA approved for NASAL specimens only), is one component of a comprehensive MRSA colonization surveillance program. It is not intended to diagnose MRSA infection nor to guide or monitor treatment for MRSA infections.          Radiology Studies: Dg Chest 2 View  Result Date: 02/04/2017 CLINICAL DATA:  Acute respiratory failure EXAM: CHEST  2 VIEW COMPARISON:  02/03/2017 FINDINGS: Cardiac shadow is enlarged. Defibrillator is again noted and stable. The lungs are well aerated bilaterally. The previously seen density  on the left has resolved and was likely related to overlying soft tissue density. No new focal abnormality is seen. IMPRESSION: No active cardiopulmonary disease. Electronically Signed   By: Alcide Clever M.D.   On: 02/04/2017 09:39        Scheduled Meds: . allopurinol  200 mg Oral Daily  . apixaban  5 mg Oral BID  . aspirin EC  81 mg Oral Daily  . atorvastatin  40 mg Oral q1800  . azithromycin  500 mg Intravenous Q24H  . carvedilol  12.5 mg Oral BID WC  . cefTRIAXone (ROCEPHIN)  IV  1 g Intravenous Once  . cefTRIAXone (ROCEPHIN)  IV  1 g Intravenous Q24H  . finasteride  5 mg Oral Daily  . furosemide  80 mg Intravenous Q12H  . hydrALAZINE  25 mg Oral TID  . insulin aspart  0-20 Units Subcutaneous TID AC & HS  . isosorbide dinitrate  40 mg Oral TID  . mouth rinse  15 mL Mouth Rinse BID  . potassium chloride  20 mEq Oral BID  . sodium chloride flush  3 mL Intravenous Q12H  . tamsulosin  0.4 mg Oral QHS  . cyanocobalamin  1,000 mcg Oral Daily   Continuous Infusions:   LOS: 2 days    Time spent: 35 mins     Annslee Tercero Joline Maxcy, MD [facility] Pager [phone]   If 7PM-7AM, please contact night-coverage [email or web address] Password [email or web address] 02/05/2017, 12:45 PM

## 2017-02-05 NOTE — Progress Notes (Signed)
*  PRELIMINARY RESULTS* Echocardiogram 2D Echocardiogram has been performed.  Ryan Macdonald 02/05/2017, 4:10 PM

## 2017-02-05 NOTE — Progress Notes (Signed)
Patient arrived to [facility] at 1630 today. Pt has settled in nicely, no complaints or concerns expressed.  Patient's tray did not arrive with floor delivery, a call was placed to dietary requesting pt's tray be delivered. They stated they would bring the tray.

## 2017-02-06 LAB — BASIC METABOLIC PANEL
ANION GAP: 12 (ref 5–15)
BUN: 27 mg/dL — AB (ref 6–20)
CALCIUM: 9.5 mg/dL (ref 8.9–10.3)
CO2: 29 mmol/L (ref 22–32)
Chloride: 98 mmol/L — ABNORMAL LOW (ref 101–111)
Creatinine, Ser: 1.56 mg/dL — ABNORMAL HIGH (ref 0.61–1.24)
GFR calc Af Amer: 47 mL/min — ABNORMAL LOW (ref >60)
GFR calc non Af Amer: 40 mL/min — ABNORMAL LOW (ref >60)
GLUCOSE: 109 mg/dL — AB (ref 65–99)
Potassium: 4.2 mmol/L (ref 3.5–5.1)
Sodium: 139 mmol/L (ref 135–145)

## 2017-02-06 LAB — GLUCOSE, CAPILLARY
Glucose-Capillary: 125 mg/dL — ABNORMAL HIGH (ref 65–99)
Glucose-Capillary: 127 mg/dL — ABNORMAL HIGH (ref 65–99)

## 2017-02-06 LAB — CULTURE, BLOOD (ROUTINE X 2)

## 2017-02-06 LAB — MAGNESIUM: Magnesium: 1.6 mg/dL — ABNORMAL LOW (ref 1.7–2.4)

## 2017-02-06 MED ORDER — AZITHROMYCIN 500 MG PO TABS: 500.0000 mg | ORAL_TABLET | Freq: Every day | ORAL | 0 refills | Status: AC

## 2017-02-06 MED ORDER — AMOXICILLIN-POT CLAVULANATE 875-125 MG PO TABS: 1.0000 | ORAL_TABLET | Freq: Two times a day (BID) | ORAL | 0 refills | Status: AC

## 2017-02-06 MED ORDER — APIXABAN 5 MG PO TABS: 5.0000 mg | ORAL_TABLET | Freq: Two times a day (BID) | ORAL | 0 refills | Status: DC

## 2017-02-06 NOTE — Progress Notes (Signed)
Patient was sleeping during report.  

## 2017-02-06 NOTE — Progress Notes (Signed)
Pt called back to [facility] stating that his pharmacy was requesting further information regarding the eliquis discount card he was provided during this stay.  Upon calling the pharmacy [phone] the pharmacist states that the card he handed them is expired, and upon verifying if the card they have was received today, the pt stated that he did leave the new card in the car.  Pharmacist stated that he would have the patient call back if there were any further issues.   Suzan Slick Tobias-Diakun,RN  02/06/17 4:46 PM

## 2017-02-06 NOTE — Discharge Summary (Signed)
Physician Discharge Summary  Diontay Rosencrans ZOX:[id number] DOB: 09-16-1934 DOA: 02/03/2017  PCP: [facility]  Admit date: 02/03/2017 Discharge date: 02/06/2017  Admitted From: Home Disposition:  Home  Recommendations for Outpatient Follow-up:  1. Follow up with PCP in 1-2 weeks; at the [place] 2. Take Eliquis as prescribed  3. Take Azithromycin orally for 5 days   Home Health: No Equipment/Devices: None Discharge Condition:stable CODE STATUS: Full Diet recommendation: Heart Healthy / Carb Modified   Brief/Interim Summary: 83 year old African-American male past medical history of nonischemic cardiomyopathy, obesity, essential hypertension, hyperlipidemia, atrial fibrillation, congestive systolic CHF with ICD in place, diabetes type 2 came to the hospital with complaints of severe shortness of breath. Initially was thought to be in acute systolic CHF with signs of fluid overload and hypoxia with possible super imposed pneumonia. He was started on IV diuretics and placed on BiPAP. Overnight he diuresed well and his symptoms significantly improved. His antibiotics were continued and he did fairly well. He was transferred out of the stepdown unit and monitored on telemetry floor. Was tolerating his diet well and was ambulating well as well in the hallway. During this time he was also noted to be in atrial fibrillation which she has been recently evaluated outpatient by his cardiologist. We have provided them with Eliquis and his rate is fairly well controlled echocardiogram was done which showed ejection fraction of 15-20% which is a slight drop from 25-30% from last year he remains asymptomatic. No further medication changes as he is on cardiac appropriate medicine and he's advised to follow-up with outpatient cardiologist for further management. Today he has reached maximum benefit from in hospital stay and will be discharged in stable condition. I've advised him to follow-up with [facility] in next 2 weeks tender.  Discharge Diagnoses:  Principal Problem:   Acute on chronic systolic heart failure, NYHA class 2 (HCC) Active Problems:   Nonischemic cardiomyopathy (HCC)   Obesity (BMI 30-39.9)   Essential hypertension   Hyperlipidemia   Atrial fibrillation (HCC)   Acute respiratory failure with hypoxia (HCC)   ICD (implantable cardioverter-defibrillator) in place   Depression   Gout   Diabetes mellitus type 2 in obese (HCC)   CAP (community acquired pneumonia)  Acute respiratory failure with hypoxia -improved -Appears to be multifactorial: Heart failure and possible pneumonia -Treat underlying causes -Continue supportive care -Wean oxygen as tolerated -Continue diuretics, will change his Abx to PO for 3 more days   Acute on chronic systolic heart failure, NYHA class 2/Nonischemic cardiomyopathy - resolved  -Patient presents with abrupt onset of shortness of breath with hypoxemia at home room air saturations 80% and mild hypertension demonstrated chest x-ray findings consistent with heart failure as well as possible superimposed pneumonia and elevated BNP 375 -Denies dietary indiscretion or recent changes in cardiac medications-last evaluated by [facility]ologist at [facility] 1 week ago -Lasix 80 mg IV q 12hrs discontinued. Started back on home Po regimen.  -Not on ARB readmission secondary to allergy -Continue carvedilol -Continue hydralazine and isosorbide dinitrate -Echocardiogram June 2017: EF 25-30% with increased LVEDP. Repeat echocardiogram  shows ef 15-20% but patient doesn't have chest pain. [facility], he is already on cardiac appropriate meds  -Daily weights, strict I/O  CAP (community acquired pneumonia) -Dense opacity mid left lung zone concerning for possible pneumonia -Empiric Zithromax/Rocephin -Blood cultures - shows staph but its likely a contaminate. and sputum culture -Urinary  strep-negative -HIV antibody per  protocol-negative  Essential hypertension -Moderate control -Attending medications as listed above  Atrial fibrillation  -New diagnosis according to patient -Eliquis started, case management to given him samples. -Currently rate controlled-denies palpitations  Diabetes mellitus type 2 in obese  -Currently his A1c is 6.3 -Continue sliding scale -resume home medications.   Obesity (BMI 30-39.9)  Hyperlipidemia -Continue Lipitor  ICD (implantable cardioverter-defibrillator) in place  Depression -Zoloft  Gout -Allopurinol  Discharge Instructions   Allergies as of 02/06/2017      Reactions   Lisinopril Swelling      Medication List    TAKE these medications   allopurinol 100 MG tablet Commonly known as:  ZYLOPRIM Take 200 mg by mouth daily.   amoxicillin-clavulanate 875-125 MG tablet Commonly known as:  AUGMENTIN Take 1 tablet by mouth 2 (two) times daily.   apixaban 5 MG Tabs tablet Commonly known as:  ELIQUIS Take 1 tablet (5 mg total) by mouth 2 (two) times daily.   aspirin EC 81 MG tablet Take 81 mg by mouth daily.   azithromycin 500 MG tablet Commonly known as:  ZITHROMAX Take 1 tablet (500 mg total) by mouth daily.   carvedilol 12.5 MG tablet Commonly known as:  COREG Take 12.5 mg by mouth 2 (two) times daily with a meal.   colchicine 0.6 MG tablet Take 1 tablet (0.6 mg total) by mouth daily.   cyanocobalamin 1000 MCG tablet Take 1,000 mcg by mouth daily.   diphenhydrAMINE 25 mg capsule Commonly known as:  BENADRYL Take 25 mg by mouth at bedtime as needed for sleep.   finasteride 5 MG tablet Commonly known as:  PROSCAR Take 5 mg by mouth daily.   furosemide 40 MG tablet Commonly known as:  LASIX Take 1 tablet (40 mg total) by mouth 2 (two) times daily.   glipiZIDE 5 MG tablet Commonly known as:  GLUCOTROL Take 5 mg by mouth daily before breakfast.   hydrALAZINE 25 MG  tablet Commonly known as:  APRESOLINE Take 25 mg by mouth 3 (three) times daily.   HYDROcodone-acetaminophen 5-325 MG tablet Commonly known as:  NORCO/VICODIN Take 2 tablets by mouth every 4 (four) hours as needed. What changed:  how much to take  when to take this  reasons to take this   isosorbide dinitrate 40 MG CR capsule Commonly known as:  DILATRATE-SR Take 40 mg by mouth 3 (three) times daily.   ketoconazole 2 % shampoo Commonly known as:  NIZORAL Apply 1 application topically 2 (two) times a week.   oxyCODONE 5 MG immediate release tablet Commonly known as:  ROXICODONE Take 1 tablet (5 mg total) by mouth every 6 (six) hours as needed for breakthrough pain.   sertraline 25 MG tablet Commonly known as:  ZOLOFT Take 50 mg by mouth daily as needed (for anxiety).   simvastatin 80 MG tablet Commonly known as:  ZOCOR Take 40 mg by mouth daily.   spironolactone 25 MG tablet Commonly known as:  ALDACTONE Take 25 mg by mouth daily.   tamsulosin 0.4 MG Caps capsule Commonly known as:  FLOMAX Take 0.4 mg by mouth at bedtime.       Allergies  Allergen Reactions  . Lisinopril Swelling    Consultations:  None   Procedures/Studies: Dg Chest 2 View  Result Date: 02/04/2017 CLINICAL DATA:  Acute respiratory failure EXAM: CHEST  2 VIEW COMPARISON:  02/03/2017 FINDINGS: Cardiac shadow is enlarged. Defibrillator is again noted and stable. The lungs are well aerated bilaterally. The previously seen  density on the left has resolved and was likely related to overlying soft tissue density. No new focal abnormality is seen. IMPRESSION: No active cardiopulmonary disease. Electronically Signed   By: Alcide Clever M.D.   On: 02/04/2017 09:39   Dg Chest 2 View  Result Date: 01/15/2017 CLINICAL DATA:  Shortness of breath.  Atrial fibrillation EXAM: CHEST  2 VIEW COMPARISON:  November 30, 2016 chest radiograph and chest CT November 30, 2016 FINDINGS: There is no edema or  consolidation. Heart is upper normal in size with pulmonary vascularity within normal limits. Pacemaker lead tip is attached to the right ventricle. There is aortic atherosclerosis. No adenopathy. There is degenerative change in the thoracic spine. IMPRESSION: No edema or consolidation.  Aortic atherosclerosis. Electronically Signed   By: Bretta Bang III M.D.   On: 01/15/2017 14:31   Dg Chest Portable 1 View  Result Date: 02/03/2017 CLINICAL DATA:  Shortness of breath for the past 30 minutes. EXAM: PORTABLE CHEST 1 VIEW COMPARISON:  PA and lateral chest 01/15/2017.  CT chest 11/30/2016. FINDINGS: Dense airspace opacity is seen in the left mid lung zone. Right lung is clear. There is cardiomegaly without edema. No pneumothorax. Atherosclerosis is seen. AICD is in place. IMPRESSION: Dense airspace opacity left mid lung zone worrisome for pneumonia. Cardiomegaly. Atherosclerosis. Electronically Signed   By: Drusilla Kanner M.D.   On: 02/03/2017 10:51       Subjective:   Discharge Exam: Vitals:   02/06/17 0558 02/06/17 0900  BP: 121/62 114/64  Pulse: 91 84  Resp: 16   Temp: 98.4 F (36.9 C)    Vitals:   02/05/17 2207 02/05/17 2300 02/06/17 0558 02/06/17 0900  BP: (!) 97/58 108/88 121/62 114/64  Pulse:  88 91 84  Resp:  20 16   Temp:  97.1 F (36.2 C) 98.4 F (36.9 C)   TempSrc:  Oral Oral   SpO2:  96% 93%   Weight:   113.7 kg (250 lb 9.6 oz)   Height:        General: Pt is alert, awake, not in acute distress Cardiovascular: RRR, S1/S2 +, no rubs, no gallops Respiratory: CTA bilaterally, no wheezing, no rhonchi Abdominal: Soft, NT, ND, bowel sounds + Extremities: no edema, no cyanosis    The results of significant diagnostics from this hospitalization (including imaging, microbiology, ancillary and laboratory) are listed below for reference.     Microbiology: Recent Results (from the past 240 hour(s))  Blood culture (routine x 2)     Status: Abnormal   Collection  Time: 02/03/17 11:55 AM  Result Value Ref Range Status   Specimen Description BLOOD LEFT ANTECUBITAL  Final   Special Requests BOTTLES DRAWN AEROBIC AND ANAEROBIC  3CC  Final   Culture  Setup Time   Final    GRAM POSITIVE COCCI IN CLUSTERS AEROBIC BOTTLE ONLY CRITICAL RESULT CALLED TO, READ BACK BY AND VERIFIED WITH: Artelia Laroche Pharm.D. 10:45 02/04/17 ([email or web address])    Culture (A)  Final    STAPHYLOCOCCUS SPECIES (COAGULASE NEGATIVE) THE SIGNIFICANCE OF ISOLATING THIS ORGANISM FROM A SINGLE SET OF BLOOD CULTURES WHEN MULTIPLE SETS ARE DRAWN IS UNCERTAIN. PLEASE NOTIFY THE MICROBIOLOGY DEPARTMENT WITHIN ONE WEEK IF SPECIATION AND SENSITIVITIES ARE REQUIRED.    Report Status 02/06/2017 FINAL  Final  Blood Culture ID Panel (Reflexed)     Status: Abnormal   Collection Time: 02/03/17 11:55 AM  Result Value Ref Range Status   Enterococcus species NOT DETECTED NOT DETECTED Final   Listeria monocytogenes  NOT DETECTED NOT DETECTED Final   Staphylococcus species DETECTED (A) NOT DETECTED Final    Comment: Methicillin (oxacillin) susceptible coagulase negative staphylococcus. Possible blood culture contaminant (unless isolated from more than one blood culture draw or clinical case suggests pathogenicity). No antibiotic treatment is indicated for blood  culture contaminants. CRITICAL RESULT CALLED TO, READ BACK BY AND VERIFIED WITH: M. Mayford Knife Pharm.D. 10:45 02/04/17 ([email or web address])    Staphylococcus aureus NOT DETECTED NOT DETECTED Final   Methicillin resistance NOT DETECTED NOT DETECTED Final   Streptococcus species NOT DETECTED NOT DETECTED Final   Streptococcus agalactiae NOT DETECTED NOT DETECTED Final   Streptococcus pneumoniae NOT DETECTED NOT DETECTED Final   Streptococcus pyogenes NOT DETECTED NOT DETECTED Final   Acinetobacter baumannii NOT DETECTED NOT DETECTED Final   Enterobacteriaceae species NOT DETECTED NOT DETECTED Final   Enterobacter cloacae complex NOT DETECTED NOT DETECTED Final    Escherichia coli NOT DETECTED NOT DETECTED Final   Klebsiella oxytoca NOT DETECTED NOT DETECTED Final   Klebsiella pneumoniae NOT DETECTED NOT DETECTED Final   Proteus species NOT DETECTED NOT DETECTED Final   Serratia marcescens NOT DETECTED NOT DETECTED Final   Haemophilus influenzae NOT DETECTED NOT DETECTED Final   Neisseria meningitidis NOT DETECTED NOT DETECTED Final   Pseudomonas aeruginosa NOT DETECTED NOT DETECTED Final   Candida albicans NOT DETECTED NOT DETECTED Final   Candida glabrata NOT DETECTED NOT DETECTED Final   Candida krusei NOT DETECTED NOT DETECTED Final   Candida parapsilosis NOT DETECTED NOT DETECTED Final   Candida tropicalis NOT DETECTED NOT DETECTED Final  Blood culture (routine x 2)     Status: None (Preliminary result)   Collection Time: 02/03/17 12:00 PM  Result Value Ref Range Status   Specimen Description BLOOD LEFT WRIST  Final   Special Requests BOTTLES DRAWN AEROBIC AND ANAEROBIC  3CC  Final   Culture NO GROWTH 2 DAYS  Final   Report Status PENDING  Incomplete  MRSA PCR Screening     Status: None   Collection Time: 02/03/17  5:22 PM  Result Value Ref Range Status   MRSA by PCR NEGATIVE NEGATIVE Final    Comment:        The GeneXpert MRSA Assay (FDA approved for NASAL specimens only), is one component of a comprehensive MRSA colonization surveillance program. It is not intended to diagnose MRSA infection nor to guide or monitor treatment for MRSA infections.      Labs: BNP (last 3 results)  Recent Labs  08/05/16 1006 11/30/16 1719 02/03/17 1024  BNP 84.3 196.7* 375.3*   Basic Metabolic Panel:  Recent Labs Lab 02/03/17 1044 02/04/17 0229 02/05/17 0702 02/06/17 0548  NA 140 139 140 139  K 4.7 3.4* 3.8 4.2  CL 105 100* 101 98*  CO2  --  29 30 29   GLUCOSE 243* 124* 116* 109*  BUN 29* 19 23* 27*  CREATININE 1.20 1.22 1.34* 1.56*  CALCIUM  --  8.8* 9.3 9.5  MG  --   --   --  1.6*   Liver Function Tests: No results for  input(s): AST, ALT, ALKPHOS, BILITOT, PROT, ALBUMIN in the last 168 hours. No results for input(s): LIPASE, AMYLASE in the last 168 hours. No results for input(s): AMMONIA in the last 168 hours. CBC:  Recent Labs Lab 02/03/17 1024 02/03/17 1044  WBC 9.7  --   NEUTROABS 5.7  --   HGB 13.3 13.9  HCT 40.1 41.0  MCV 94.6  --   PLT 248  --  Cardiac Enzymes: No results for input(s): CKTOTAL, CKMB, CKMBINDEX, TROPONINI in the last 168 hours. BNP: Invalid input(s): POCBNP CBG:  Recent Labs Lab 02/05/17 1133 02/05/17 1608 02/05/17 2030 02/05/17 2251 02/06/17 0738  GLUCAP 143* 110* 159* 125* 125*   D-Dimer No results for input(s): DDIMER in the last 72 hours. Hgb A1c  Recent Labs  02/03/17 1509  HGBA1C 6.3*   Lipid Profile No results for input(s): CHOL, HDL, LDLCALC, TRIG, CHOLHDL, LDLDIRECT in the last 72 hours. Thyroid function studies No results for input(s): TSH, T4TOTAL, T3FREE, THYROIDAB in the last 72 hours.  Invalid input(s): FREET3 Anemia work up No results for input(s): VITAMINB12, FOLATE, FERRITIN, TIBC, IRON, RETICCTPCT in the last 72 hours. Urinalysis    Component Value Date/Time   COLORURINE YELLOW 05/20/2016 0301   APPEARANCEUR CLEAR 05/20/2016 0301   LABSPEC 1.012 05/20/2016 0301   PHURINE 5.0 05/20/2016 0301   GLUCOSEU NEGATIVE 05/20/2016 0301   HGBUR TRACE (A) 05/20/2016 0301   BILIRUBINUR NEGATIVE 05/20/2016 0301   KETONESUR NEGATIVE 05/20/2016 0301   PROTEINUR NEGATIVE 05/20/2016 0301   UROBILINOGEN 0.2 05/26/2015 1521   NITRITE POSITIVE (A) 05/20/2016 0301   LEUKOCYTESUR MODERATE (A) 05/20/2016 0301   Sepsis Labs Invalid input(s): PROCALCITONIN,  WBC,  LACTICIDVEN Microbiology Recent Results (from the past 240 hour(s))  Blood culture (routine x 2)     Status: Abnormal   Collection Time: 02/03/17 11:55 AM  Result Value Ref Range Status   Specimen Description BLOOD LEFT ANTECUBITAL  Final   Special Requests BOTTLES DRAWN AEROBIC AND  ANAEROBIC  3CC  Final   Culture  Setup Time   Final    GRAM POSITIVE COCCI IN CLUSTERS AEROBIC BOTTLE ONLY CRITICAL RESULT CALLED TO, READ BACK BY AND VERIFIED WITH: Artelia Laroche Pharm.D. 10:45 02/04/17 ([email or web address])    Culture (A)  Final    STAPHYLOCOCCUS SPECIES (COAGULASE NEGATIVE) THE SIGNIFICANCE OF ISOLATING THIS ORGANISM FROM A SINGLE SET OF BLOOD CULTURES WHEN MULTIPLE SETS ARE DRAWN IS UNCERTAIN. PLEASE NOTIFY THE MICROBIOLOGY DEPARTMENT WITHIN ONE WEEK IF SPECIATION AND SENSITIVITIES ARE REQUIRED.    Report Status 02/06/2017 FINAL  Final  Blood Culture ID Panel (Reflexed)     Status: Abnormal   Collection Time: 02/03/17 11:55 AM  Result Value Ref Range Status   Enterococcus species NOT DETECTED NOT DETECTED Final   Listeria monocytogenes NOT DETECTED NOT DETECTED Final   Staphylococcus species DETECTED (A) NOT DETECTED Final    Comment: Methicillin (oxacillin) susceptible coagulase negative staphylococcus. Possible blood culture contaminant (unless isolated from more than one blood culture draw or clinical case suggests pathogenicity). No antibiotic treatment is indicated for blood  culture contaminants. CRITICAL RESULT CALLED TO, READ BACK BY AND VERIFIED WITH: M. Mayford Knife Pharm.D. 10:45 02/04/17 ([email or web address])    Staphylococcus aureus NOT DETECTED NOT DETECTED Final   Methicillin resistance NOT DETECTED NOT DETECTED Final   Streptococcus species NOT DETECTED NOT DETECTED Final   Streptococcus agalactiae NOT DETECTED NOT DETECTED Final   Streptococcus pneumoniae NOT DETECTED NOT DETECTED Final   Streptococcus pyogenes NOT DETECTED NOT DETECTED Final   Acinetobacter baumannii NOT DETECTED NOT DETECTED Final   Enterobacteriaceae species NOT DETECTED NOT DETECTED Final   Enterobacter cloacae complex NOT DETECTED NOT DETECTED Final   Escherichia coli NOT DETECTED NOT DETECTED Final   Klebsiella oxytoca NOT DETECTED NOT DETECTED Final   Klebsiella pneumoniae NOT DETECTED NOT DETECTED Final    Proteus species NOT DETECTED NOT DETECTED Final   Serratia marcescens NOT DETECTED NOT DETECTED Final  Haemophilus influenzae NOT DETECTED NOT DETECTED Final   Neisseria meningitidis NOT DETECTED NOT DETECTED Final   Pseudomonas aeruginosa NOT DETECTED NOT DETECTED Final   Candida albicans NOT DETECTED NOT DETECTED Final   Candida glabrata NOT DETECTED NOT DETECTED Final   Candida krusei NOT DETECTED NOT DETECTED Final   Candida parapsilosis NOT DETECTED NOT DETECTED Final   Candida tropicalis NOT DETECTED NOT DETECTED Final  Blood culture (routine x 2)     Status: None (Preliminary result)   Collection Time: 02/03/17 12:00 PM  Result Value Ref Range Status   Specimen Description BLOOD LEFT WRIST  Final   Special Requests BOTTLES DRAWN AEROBIC AND ANAEROBIC  3CC  Final   Culture NO GROWTH 2 DAYS  Final   Report Status PENDING  Incomplete  MRSA PCR Screening     Status: None   Collection Time: 02/03/17  5:22 PM  Result Value Ref Range Status   MRSA by PCR NEGATIVE NEGATIVE Final    Comment:        The GeneXpert MRSA Assay (FDA approved for NASAL specimens only), is one component of a comprehensive MRSA colonization surveillance program. It is not intended to diagnose MRSA infection nor to guide or monitor treatment for MRSA infections.      Time coordinating discharge: Over 30 minutes  SIGNED:   Dimple Nanas, MD  [facility] 02/06/2017, 11:08 AM Pager   If 7PM-7AM, please contact night-coverage [email or web address] Password [email or web address]

## 2017-02-06 NOTE — Progress Notes (Signed)
PROGRESS NOTE    Ryan Macdonald  RUE:[id number] DOB: August 18, 1934 DOA: 02/03/2017 PCP: [facility]   Brief Narrative:  83 year old African-American male with history of dilated nonischemic cardiomyopathy with congestive heart failure ejection fraction 25-30% came to the ED with complaints of shortness of breath. He was found to be in acute hypoxic respiratory failure likely due to acute systolic CHF with superimposed pneumonia. He was started on IV diuretics, placed on BiPAP and started on IV antibiotics.   Assessment & Plan:   Principal Problem:   Acute on chronic systolic heart failure, NYHA class 2 (HCC) Active Problems:   Nonischemic cardiomyopathy (HCC)   Obesity (BMI 30-39.9)   Essential hypertension   Hyperlipidemia   Atrial fibrillation (HCC)   Acute respiratory failure with hypoxia (HCC)   ICD (implantable cardioverter-defibrillator) in place   Depression   Gout   Diabetes mellitus type 2 in obese (HCC)   CAP (community acquired pneumonia)      Acute respiratory failure with hypoxia -improved -Appears to be multifactorial: Heart failure and possible pneumonia -Treat underlying causes -Continue supportive care -Wean oxygen as tolerated -Continue diuretics, will change his Abx to PO for 3 more days    Acute on chronic systolic heart failure, NYHA class 2/ Nonischemic cardiomyopathy  -Patient presents with abrupt onset of shortness of breath with hypoxemia at home room air saturations 80% and mild hypertension demonstrated chest x-ray findings consistent with heart failure as well as possible superimposed pneumonia and elevated BNP 375 -Denies dietary indiscretion or recent changes in cardiac medications- last evaluated by cardiologist at [facility] 1 week ago -Lasix 80 mg IV q 12hrs discontinued. Started back on home Po regimen.  -Not on ARB readmission secondary to allergy -Continue carvedilol -Continue hydralazine and isosorbide dinitrate -Echocardiogram June  2017: EF 25-30% with increased LVEDP. Repeat echocardiogram ordered - shows ef 15-20% but patient doesn't have chest pain. I [facility], he is already on cardiac appropriate meds  -Daily weights, strict I/O    CAP (community acquired pneumonia) -Dense opacity mid left lung zone concerning for possible pneumonia -Empiric Zithromax/Rocephin -Blood cultures - shows staph but its likely a contaminate. and sputum culture -Urinary strep-negative -HIV antibody per protocol-negative    Essential hypertension -Moderate control -Attending medications as listed above    Atrial fibrillation  -New diagnosis according to patient -Eliquis started, case management to given him samples. -Currently rate controlled-denies palpitations    Diabetes mellitus type 2 in obese  -Currently his A1c is 6.3 -Continue sliding scale    Obesity (BMI 30-39.9)    Hyperlipidemia -Continue Lipitor    ICD (implantable cardioverter-defibrillator) in place    Depression -Zoloft    Gout -Allopurinol   DVT prophylaxis: Eliquis Code Status: Full Family Communication: Patient comprehends well  Disposition Plan:  Discharge today.  Consults called: None   Antimicrobials:   Rocephin and azithromycin 3/9     Subjective: Patient doesn't have any complaints this morning. He states he feels great and wants to go home.   Objective: Vitals:   02/05/17 2207 02/05/17 2300 02/06/17 0558 02/06/17 0900  BP: (!) 97/58 108/88 121/62 114/64  Pulse:  88 91 84  Resp:  20 16   Temp:  97.1 F (36.2 C) 98.4 F (36.9 C)   TempSrc:  Oral Oral   SpO2:  96% 93%   Weight:   113.7 kg (250 lb 9.6 oz)   Height:  Intake/Output Summary (Last 24 hours) at 02/06/17 1054 Last data filed at 02/06/17 1000  Gross per 24 hour  Intake             1833 ml  Output              970 ml  Net              863 ml   Filed Weights   02/05/17 0353 02/05/17 1658 02/06/17  0558  Weight: 117.1 kg (258 lb 3.2 oz) 104.3 kg (230 lb) 113.7 kg (250 lb 9.6 oz)    Examination:  General exam: Appears calm and comfortable  Respiratory system: very mild Bibasilar crackles.  Cardiovascular system: S1 & S2 heard, RRR. No JVD, murmurs, rubs, gallops or clicks. No pedal edema. Gastrointestinal system: Abdomen is nondistended, soft and nontender. No organomegaly or masses felt. Normal bowel sounds heard. Central nervous system: Alert and oriented. No focal neurological deficits. Extremities: Symmetric 5 x 5 power. Skin: No rashes, lesions or ulcers Psychiatry: Judgement and insight appear normal. Mood & affect appropriate.     Data Reviewed:   CBC:  Recent Labs Lab 02/03/17 1024 02/03/17 1044  WBC 9.7  --   NEUTROABS 5.7  --   HGB 13.3 13.9  HCT 40.1 41.0  MCV 94.6  --   PLT 248  --    Basic Metabolic Panel:  Recent Labs Lab 02/03/17 1044 02/04/17 0229 02/05/17 0702 02/06/17 0548  NA 140 139 140 139  K 4.7 3.4* 3.8 4.2  CL 105 100* 101 98*  CO2  --  29 30 29   GLUCOSE 243* 124* 116* 109*  BUN 29* 19 23* 27*  CREATININE 1.20 1.22 1.34* 1.56*  CALCIUM  --  8.8* 9.3 9.5  MG  --   --   --  1.6*   GFR: Estimated Creatinine Clearance: 46.2 mL/min (by C-G formula based on SCr of 1.56 mg/dL (H)). Liver Function Tests: No results for input(s): AST, ALT, ALKPHOS, BILITOT, PROT, ALBUMIN in the last 168 hours. No results for input(s): LIPASE, AMYLASE in the last 168 hours. No results for input(s): AMMONIA in the last 168 hours. Coagulation Profile: No results for input(s): INR, PROTIME in the last 168 hours. Cardiac Enzymes: No results for input(s): CKTOTAL, CKMB, CKMBINDEX, TROPONINI in the last 168 hours. BNP (last 3 results) No results for input(s): PROBNP in the last 8760 hours. HbA1C:  Recent Labs  02/03/17 1509  HGBA1C 6.3*   CBG:  Recent Labs Lab 02/05/17 1133 02/05/17 1608 02/05/17 2030 02/05/17 2251 02/06/17 0738  GLUCAP 143*  110* 159* 125* 125*   Lipid Profile: No results for input(s): CHOL, HDL, LDLCALC, TRIG, CHOLHDL, LDLDIRECT in the last 72 hours. Thyroid Function Tests: No results for input(s): TSH, T4TOTAL, FREET4, T3FREE, THYROIDAB in the last 72 hours. Anemia Panel: No results for input(s): VITAMINB12, FOLATE, FERRITIN, TIBC, IRON, RETICCTPCT in the last 72 hours. Sepsis Labs:  Recent Labs Lab 02/03/17 1238 02/03/17 1509 02/03/17 1549 02/05/17 0702  PROCALCITON  --  0.12  --  0.15  LATICACIDVEN 2.80*  --  1.64  --     Recent Results (from the past 240 hour(s))  Blood culture (routine x 2)     Status: Abnormal   Collection Time: 02/03/17 11:55 AM  Result Value Ref Range Status   Specimen Description BLOOD LEFT ANTECUBITAL  Final   Special Requests BOTTLES DRAWN AEROBIC AND ANAEROBIC  3CC  Final   Culture  Setup Time  Final    GRAM POSITIVE COCCI IN CLUSTERS AEROBIC BOTTLE ONLY CRITICAL RESULT CALLED TO, READ BACK BY AND VERIFIED WITH: M. Mayford Knife Pharm.D. 10:45 02/04/17 ([email or web address])    Culture (A)  Final    STAPHYLOCOCCUS SPECIES (COAGULASE NEGATIVE) THE SIGNIFICANCE OF ISOLATING THIS ORGANISM FROM A SINGLE SET OF BLOOD CULTURES WHEN MULTIPLE SETS ARE DRAWN IS UNCERTAIN. PLEASE NOTIFY THE MICROBIOLOGY DEPARTMENT WITHIN ONE WEEK IF SPECIATION AND SENSITIVITIES ARE REQUIRED.    Report Status 02/06/2017 FINAL  Final  Blood Culture ID Panel (Reflexed)     Status: Abnormal   Collection Time: 02/03/17 11:55 AM  Result Value Ref Range Status   Enterococcus species NOT DETECTED NOT DETECTED Final   Listeria monocytogenes NOT DETECTED NOT DETECTED Final   Staphylococcus species DETECTED (A) NOT DETECTED Final    Comment: Methicillin (oxacillin) susceptible coagulase negative staphylococcus. Possible blood culture contaminant (unless isolated from more than one blood culture draw or clinical case suggests pathogenicity). No antibiotic treatment is indicated for blood  culture contaminants. CRITICAL  RESULT CALLED TO, READ BACK BY AND VERIFIED WITH: M. Mayford Knife Pharm.D. 10:45 02/04/17 ([email or web address])    Staphylococcus aureus NOT DETECTED NOT DETECTED Final   Methicillin resistance NOT DETECTED NOT DETECTED Final   Streptococcus species NOT DETECTED NOT DETECTED Final   Streptococcus agalactiae NOT DETECTED NOT DETECTED Final   Streptococcus pneumoniae NOT DETECTED NOT DETECTED Final   Streptococcus pyogenes NOT DETECTED NOT DETECTED Final   Acinetobacter baumannii NOT DETECTED NOT DETECTED Final   Enterobacteriaceae species NOT DETECTED NOT DETECTED Final   Enterobacter cloacae complex NOT DETECTED NOT DETECTED Final   Escherichia coli NOT DETECTED NOT DETECTED Final   Klebsiella oxytoca NOT DETECTED NOT DETECTED Final   Klebsiella pneumoniae NOT DETECTED NOT DETECTED Final   Proteus species NOT DETECTED NOT DETECTED Final   Serratia marcescens NOT DETECTED NOT DETECTED Final   Haemophilus influenzae NOT DETECTED NOT DETECTED Final   Neisseria meningitidis NOT DETECTED NOT DETECTED Final   Pseudomonas aeruginosa NOT DETECTED NOT DETECTED Final   Candida albicans NOT DETECTED NOT DETECTED Final   Candida glabrata NOT DETECTED NOT DETECTED Final   Candida krusei NOT DETECTED NOT DETECTED Final   Candida parapsilosis NOT DETECTED NOT DETECTED Final   Candida tropicalis NOT DETECTED NOT DETECTED Final  Blood culture (routine x 2)     Status: None (Preliminary result)   Collection Time: 02/03/17 12:00 PM  Result Value Ref Range Status   Specimen Description BLOOD LEFT WRIST  Final   Special Requests BOTTLES DRAWN AEROBIC AND ANAEROBIC  3CC  Final   Culture NO GROWTH 2 DAYS  Final   Report Status PENDING  Incomplete  MRSA PCR Screening     Status: None   Collection Time: 02/03/17  5:22 PM  Result Value Ref Range Status   MRSA by PCR NEGATIVE NEGATIVE Final    Comment:        The GeneXpert MRSA Assay (FDA approved for NASAL specimens only), is one component of a comprehensive MRSA  colonization surveillance program. It is not intended to diagnose MRSA infection nor to guide or monitor treatment for MRSA infections.          Radiology Studies: No results found.      Scheduled Meds: . allopurinol  200 mg Oral Daily  . apixaban  5 mg Oral BID  . aspirin EC  81 mg Oral Daily  . atorvastatin  40 mg Oral q1800  . azithromycin  500 mg Intravenous Q24H  .  carvedilol  12.5 mg Oral BID WC  . cefTRIAXone (ROCEPHIN)  IV  1 g Intravenous Once  . cefTRIAXone (ROCEPHIN)  IV  1 g Intravenous Q24H  . finasteride  5 mg Oral Daily  . furosemide  40 mg Oral BID  . hydrALAZINE  25 mg Oral TID  . insulin aspart  0-20 Units Subcutaneous TID AC & HS  . isosorbide dinitrate  40 mg Oral TID  . mouth rinse  15 mL Mouth Rinse BID  . potassium chloride  20 mEq Oral BID  . sodium chloride flush  3 mL Intravenous Q12H  . tamsulosin  0.4 mg Oral QHS  . cyanocobalamin  1,000 mcg Oral Daily   Continuous Infusions:   LOS: 3 days    Time spent: 35 mins     Ankit Joline Maxcy, MD [facility] Pager [phone]   If 7PM-7AM, please contact night-coverage [email or web address] Password [facility] 02/06/2017, 10:54 AM

## 2017-02-08 LAB — CULTURE, BLOOD (ROUTINE X 2): CULTURE: NO GROWTH

## 2017-03-23 ENCOUNTER — Encounter (HOSPITAL_COMMUNITY): Payer: Self-pay | Admitting: Family Medicine

## 2017-03-23 ENCOUNTER — Emergency Department (HOSPITAL_COMMUNITY): Payer: Medicare Other

## 2017-03-23 ENCOUNTER — Inpatient Hospital Stay (HOSPITAL_COMMUNITY)
Admission: EM | Admit: 2017-03-23 | Discharge: 2017-03-25 | DRG: 291 | Disposition: A | Discharge status: Home / Self Care | Payer: Medicare Other | Attending: Family Medicine | Admitting: Family Medicine

## 2017-03-23 DIAGNOSIS — Z6841 Body Mass Index (BMI) 40.0 and over, adult: Secondary | ICD-10-CM | POA: Diagnosis not present

## 2017-03-23 DIAGNOSIS — E785 Hyperlipidemia, unspecified: Secondary | ICD-10-CM | POA: Diagnosis present

## 2017-03-23 DIAGNOSIS — E872 Acidosis, unspecified: Secondary | ICD-10-CM

## 2017-03-23 DIAGNOSIS — I160 Hypertensive urgency: Secondary | ICD-10-CM | POA: Diagnosis present

## 2017-03-23 DIAGNOSIS — J9601 Acute respiratory failure with hypoxia: Secondary | ICD-10-CM | POA: Diagnosis present

## 2017-03-23 DIAGNOSIS — Z7901 Long term (current) use of anticoagulants: Secondary | ICD-10-CM | POA: Diagnosis not present

## 2017-03-23 DIAGNOSIS — E1169 Type 2 diabetes mellitus with other specified complication: Secondary | ICD-10-CM | POA: Diagnosis not present

## 2017-03-23 DIAGNOSIS — I429 Cardiomyopathy, unspecified: Secondary | ICD-10-CM | POA: Diagnosis present

## 2017-03-23 DIAGNOSIS — E1122 Type 2 diabetes mellitus with diabetic chronic kidney disease: Secondary | ICD-10-CM | POA: Diagnosis present

## 2017-03-23 DIAGNOSIS — Z96642 Presence of left artificial hip joint: Secondary | ICD-10-CM | POA: Diagnosis present

## 2017-03-23 DIAGNOSIS — F32A Depression, unspecified: Secondary | ICD-10-CM | POA: Diagnosis present

## 2017-03-23 DIAGNOSIS — Z95810 Presence of automatic (implantable) cardiac defibrillator: Secondary | ICD-10-CM | POA: Diagnosis not present

## 2017-03-23 DIAGNOSIS — Z7982 Long term (current) use of aspirin: Secondary | ICD-10-CM | POA: Diagnosis not present

## 2017-03-23 DIAGNOSIS — I472 Ventricular tachycardia: Secondary | ICD-10-CM | POA: Diagnosis present

## 2017-03-23 DIAGNOSIS — I428 Other cardiomyopathies: Secondary | ICD-10-CM | POA: Diagnosis not present

## 2017-03-23 DIAGNOSIS — Z7984 Long term (current) use of oral hypoglycemic drugs: Secondary | ICD-10-CM | POA: Diagnosis not present

## 2017-03-23 DIAGNOSIS — Z79899 Other long term (current) drug therapy: Secondary | ICD-10-CM | POA: Diagnosis not present

## 2017-03-23 DIAGNOSIS — I161 Hypertensive emergency: Secondary | ICD-10-CM | POA: Diagnosis present

## 2017-03-23 DIAGNOSIS — I10 Essential (primary) hypertension: Secondary | ICD-10-CM | POA: Diagnosis not present

## 2017-03-23 DIAGNOSIS — M109 Gout, unspecified: Secondary | ICD-10-CM | POA: Diagnosis present

## 2017-03-23 DIAGNOSIS — I480 Paroxysmal atrial fibrillation: Secondary | ICD-10-CM | POA: Diagnosis present

## 2017-03-23 DIAGNOSIS — F329 Major depressive disorder, single episode, unspecified: Secondary | ICD-10-CM | POA: Diagnosis present

## 2017-03-23 DIAGNOSIS — Z888 Allergy status to other drugs, medicaments and biological substances status: Secondary | ICD-10-CM

## 2017-03-23 DIAGNOSIS — N183 Chronic kidney disease, stage 3 (moderate): Secondary | ICD-10-CM | POA: Diagnosis present

## 2017-03-23 DIAGNOSIS — Z87891 Personal history of nicotine dependence: Secondary | ICD-10-CM

## 2017-03-23 DIAGNOSIS — J9600 Acute respiratory failure, unspecified whether with hypoxia or hypercapnia: Secondary | ICD-10-CM | POA: Diagnosis present

## 2017-03-23 DIAGNOSIS — I5043 Acute on chronic combined systolic (congestive) and diastolic (congestive) heart failure: Secondary | ICD-10-CM | POA: Diagnosis present

## 2017-03-23 DIAGNOSIS — E669 Obesity, unspecified: Secondary | ICD-10-CM | POA: Diagnosis not present

## 2017-03-23 DIAGNOSIS — I130 Hypertensive heart and chronic kidney disease with heart failure and stage 1 through stage 4 chronic kidney disease, or unspecified chronic kidney disease: Principal | ICD-10-CM | POA: Diagnosis present

## 2017-03-23 DIAGNOSIS — E784 Other hyperlipidemia: Secondary | ICD-10-CM | POA: Diagnosis not present

## 2017-03-23 LAB — COMPREHENSIVE METABOLIC PANEL
ALK PHOS: 50 U/L (ref 38–126)
ALT: 23 U/L (ref 17–63)
ANION GAP: 10 (ref 5–15)
AST: 32 U/L (ref 15–41)
Albumin: 3.3 g/dL — ABNORMAL LOW (ref 3.5–5.0)
BUN: 19 mg/dL (ref 6–20)
CALCIUM: 8.9 mg/dL (ref 8.9–10.3)
CO2: 25 mmol/L (ref 22–32)
Chloride: 107 mmol/L (ref 101–111)
Creatinine, Ser: 1.24 mg/dL (ref 0.61–1.24)
GFR calc non Af Amer: 53 mL/min — ABNORMAL LOW (ref >60)
Glucose, Bld: 204 mg/dL — ABNORMAL HIGH (ref 65–99)
Potassium: 3.4 mmol/L — ABNORMAL LOW (ref 3.5–5.1)
SODIUM: 142 mmol/L (ref 135–145)
TOTAL PROTEIN: 7.2 g/dL (ref 6.5–8.1)
Total Bilirubin: 0.6 mg/dL (ref 0.3–1.2)

## 2017-03-23 LAB — CBC
HCT: 39.7 % (ref 39.0–52.0)
Hemoglobin: 13.2 g/dL (ref 13.0–17.0)
MCH: 31.1 pg (ref 26.0–34.0)
MCHC: 33.2 g/dL (ref 30.0–36.0)
MCV: 93.4 fL (ref 78.0–100.0)
PLATELETS: 274 10*3/uL (ref 150–400)
RBC: 4.25 MIL/uL (ref 4.22–5.81)
RDW: 14.2 % (ref 11.5–15.5)
WBC: 11.9 10*3/uL — AB (ref 4.0–10.5)

## 2017-03-23 LAB — I-STAT TROPONIN, ED: Troponin i, poc: 0.04 ng/mL (ref 0.00–0.08)

## 2017-03-23 LAB — I-STAT ARTERIAL BLOOD GAS, ED
ACID-BASE DEFICIT: 2 mmol/L (ref 0.0–2.0)
BICARBONATE: 24.8 mmol/L (ref 20.0–28.0)
O2 SAT: 98 %
PO2 ART: 118 mmHg — AB (ref 83.0–108.0)
Patient temperature: 98.1
TCO2: 26 mmol/L (ref 0–100)
pCO2 arterial: 46.6 mmHg (ref 32.0–48.0)
pH, Arterial: 7.332 — ABNORMAL LOW (ref 7.350–7.450)

## 2017-03-23 LAB — MAGNESIUM: MAGNESIUM: 1.5 mg/dL — AB (ref 1.7–2.4)

## 2017-03-23 LAB — CBG MONITORING, ED: GLUCOSE-CAPILLARY: 165 mg/dL — AB (ref 65–99)

## 2017-03-23 LAB — I-STAT CG4 LACTIC ACID, ED
LACTIC ACID, VENOUS: 2.79 mmol/L — AB (ref 0.5–1.9)
Lactic Acid, Venous: 1.96 mmol/L (ref 0.5–1.9)

## 2017-03-23 LAB — PROCALCITONIN: Procalcitonin: <0.1 ng/mL

## 2017-03-23 LAB — GLUCOSE, CAPILLARY: GLUCOSE-CAPILLARY: 304 mg/dL — AB (ref 65–99)

## 2017-03-23 LAB — BRAIN NATRIURETIC PEPTIDE: B NATRIURETIC PEPTIDE 5: 377.3 pg/mL — AB (ref 0.0–100.0)

## 2017-03-23 MED ORDER — OXYCODONE HCL 5 MG PO TABS
5.0000 mg | ORAL_TABLET | Freq: Four times a day (QID) | ORAL | Status: DC | PRN
  Administered 2017-03-23 – 2017-03-24 (×2): 5 mg via ORAL
  Filled 2017-03-23 (×2): qty 1

## 2017-03-23 MED ORDER — MAGNESIUM SULFATE 2 GM/50ML IV SOLN
2.0000 g | Freq: Once | INTRAVENOUS | Status: AC
  Administered 2017-03-23: 2 g via INTRAVENOUS
  Filled 2017-03-23: qty 50

## 2017-03-23 MED ORDER — METHYLPREDNISOLONE SODIUM SUCC 125 MG IJ SOLR
125.0000 mg | Freq: Once | INTRAMUSCULAR | Status: AC
  Administered 2017-03-23: 125 mg via INTRAVENOUS
  Filled 2017-03-23: qty 2

## 2017-03-23 MED ORDER — INSULIN ASPART 100 UNIT/ML ~~LOC~~ SOLN
0.0000 [IU] | Freq: Every day | SUBCUTANEOUS | Status: DC
  Administered 2017-03-23: 4 [IU] via SUBCUTANEOUS

## 2017-03-23 MED ORDER — FUROSEMIDE 10 MG/ML IJ SOLN: 60.0000 mg | Freq: Two times a day (BID) | INTRAMUSCULAR | Status: DC

## 2017-03-23 MED ORDER — ALBUTEROL SULFATE (2.5 MG/3ML) 0.083% IN NEBU
INHALATION_SOLUTION | RESPIRATORY_TRACT | Status: AC
  Administered 2017-03-23: 5 mg
  Filled 2017-03-23: qty 6

## 2017-03-23 MED ORDER — CARVEDILOL 12.5 MG PO TABS
12.5000 mg | ORAL_TABLET | Freq: Two times a day (BID) | ORAL | Status: DC
  Administered 2017-03-23 – 2017-03-25 (×4): 12.5 mg via ORAL
  Filled 2017-03-23 (×4): qty 1

## 2017-03-23 MED ORDER — FINASTERIDE 5 MG PO TABS
5.0000 mg | ORAL_TABLET | Freq: Every day | ORAL | Status: DC
  Administered 2017-03-23 – 2017-03-25 (×3): 5 mg via ORAL
  Filled 2017-03-23 (×3): qty 1

## 2017-03-23 MED ORDER — ACETAMINOPHEN 650 MG RE SUPP: 650.0000 mg | Freq: Four times a day (QID) | RECTAL | Status: DC | PRN

## 2017-03-23 MED ORDER — APIXABAN 5 MG PO TABS
5.0000 mg | ORAL_TABLET | Freq: Two times a day (BID) | ORAL | Status: DC
Start: 2017-03-23 — End: 2017-03-25
  Administered 2017-03-23 – 2017-03-25 (×4): 5 mg via ORAL
  Filled 2017-03-23 (×4): qty 1

## 2017-03-23 MED ORDER — NITROGLYCERIN IN D5W 200-5 MCG/ML-% IV SOLN
INTRAVENOUS | Status: AC
  Filled 2017-03-23: qty 250

## 2017-03-23 MED ORDER — SPIRONOLACTONE 25 MG PO TABS
25.0000 mg | ORAL_TABLET | Freq: Every day | ORAL | Status: DC
  Administered 2017-03-23 – 2017-03-25 (×3): 25 mg via ORAL
  Filled 2017-03-23 (×3): qty 1

## 2017-03-23 MED ORDER — FENTANYL CITRATE (PF) 100 MCG/2ML IJ SOLN
25.0000 ug | Freq: Once | INTRAMUSCULAR | Status: AC
  Administered 2017-03-23: 25 ug via INTRAVENOUS
  Filled 2017-03-23: qty 2

## 2017-03-23 MED ORDER — FUROSEMIDE 10 MG/ML IJ SOLN
40.0000 mg | INTRAMUSCULAR | Status: AC
  Administered 2017-03-23: 40 mg via INTRAVENOUS
  Filled 2017-03-23: qty 4

## 2017-03-23 MED ORDER — FUROSEMIDE 10 MG/ML IJ SOLN
60.0000 mg | Freq: Two times a day (BID) | INTRAMUSCULAR | Status: DC
  Administered 2017-03-24: 60 mg via INTRAVENOUS
  Filled 2017-03-23: qty 6

## 2017-03-23 MED ORDER — NITROGLYCERIN IN D5W 200-5 MCG/ML-% IV SOLN
0.0000 ug/min | Freq: Once | INTRAVENOUS | Status: AC
  Administered 2017-03-23: 20 ug/min via INTRAVENOUS

## 2017-03-23 MED ORDER — NITROGLYCERIN 2 % TD OINT
1.0000 [in_us] | TOPICAL_OINTMENT | Freq: Once | TRANSDERMAL | Status: AC
  Administered 2017-03-23: 1 [in_us] via TOPICAL
  Filled 2017-03-23: qty 1

## 2017-03-23 MED ORDER — POLYETHYLENE GLYCOL 3350 17 G PO PACK: 17.0000 g | PACK | Freq: Every day | ORAL | Status: DC | PRN

## 2017-03-23 MED ORDER — SERTRALINE HCL 50 MG PO TABS
50.0000 mg | ORAL_TABLET | Freq: Every day | ORAL | Status: DC | PRN
  Filled 2017-03-23: qty 1

## 2017-03-23 MED ORDER — ISOSORBIDE DINITRATE ER 40 MG PO CPCR
40.0000 mg | ORAL_CAPSULE | Freq: Three times a day (TID) | ORAL | Status: DC
  Administered 2017-03-23 – 2017-03-25 (×6): 40 mg via ORAL
  Filled 2017-03-23 (×8): qty 1

## 2017-03-23 MED ORDER — HYDRALAZINE HCL 25 MG PO TABS
25.0000 mg | ORAL_TABLET | Freq: Three times a day (TID) | ORAL | Status: DC
  Administered 2017-03-23 – 2017-03-25 (×6): 25 mg via ORAL
  Filled 2017-03-23 (×6): qty 1

## 2017-03-23 MED ORDER — INSULIN ASPART 100 UNIT/ML ~~LOC~~ SOLN
0.0000 [IU] | Freq: Three times a day (TID) | SUBCUTANEOUS | Status: DC
  Administered 2017-03-23 – 2017-03-24 (×2): 2 [IU] via SUBCUTANEOUS
  Administered 2017-03-24: 1 [IU] via SUBCUTANEOUS
  Administered 2017-03-24: 5 [IU] via SUBCUTANEOUS
  Administered 2017-03-25: 1 [IU] via SUBCUTANEOUS
  Filled 2017-03-23: qty 1

## 2017-03-23 MED ORDER — ACETAMINOPHEN 325 MG PO TABS: 650.0000 mg | ORAL_TABLET | Freq: Four times a day (QID) | ORAL | Status: DC | PRN

## 2017-03-23 MED ORDER — ATORVASTATIN CALCIUM 20 MG PO TABS
20.0000 mg | ORAL_TABLET | Freq: Every day | ORAL | Status: DC
  Administered 2017-03-23 – 2017-03-24 (×2): 20 mg via ORAL
  Filled 2017-03-23 (×2): qty 1

## 2017-03-23 MED ORDER — FUROSEMIDE 10 MG/ML IJ SOLN
20.0000 mg | Freq: Once | INTRAMUSCULAR | Status: AC
  Administered 2017-03-23: 20 mg via INTRAVENOUS
  Filled 2017-03-23: qty 2

## 2017-03-23 MED ORDER — TRAZODONE HCL 50 MG PO TABS: 25.0000 mg | ORAL_TABLET | Freq: Every evening | ORAL | Status: DC | PRN

## 2017-03-23 NOTE — ED Notes (Signed)
Pt received dinner tray. Will check CBG

## 2017-03-23 NOTE — ED Provider Notes (Signed)
[facility]-EMERGENCY DEPT Provider Note   CSN: [id number] Arrival date & time: 03/23/17  1245     History   Chief Complaint Chief Complaint  Patient presents with  . Shortness of Breath    HPI Ryan Macdonald is a 83 y.o. male.  83yo M w/ extensive PMH including HTN, CHF w/ EF 15-20%, T2DM, A fib who p/w respiratory distress. Patient called EMS for sudden onset of shortness of breath around 11 AM today. EMS found him to be diaphoretic, tachypneic, and in respiratory distress. He was given nitroglycerin and placed on a nonrebreather. He reported a history of COPD and CHF. He reports mild cough but no fever. No chest pain.  LEVEL 5 CAVEAT DUE TO RESPIRATORY DISTRESS   The history is provided by the EMS personnel.    Past Medical History:  Diagnosis Date  . Cat scratch fever    removed mass on right side neck  . Cataracts, bilateral   . CHF (congestive heart failure) (HCC)   . Diabetes mellitus without complication (HCC)    TYPE 2  . High cholesterol   . Hypertension   . Kidney stones   . Morbid obesity (HCC)   . Nonischemic cardiomyopathy (HCC)    a. ? 2009 Cath in [place] - nl cors per pt;  b. 09/2013 Echo: EF 25-30%, sev diff HK.  . Osteoarthritis   . PAF (paroxysmal atrial fibrillation) (HCC)    a. post-op hip in 2014 - prev on xarelto.  . Shortness of breath dyspnea     Patient Active Problem List   Diagnosis Date Noted  . Acute on chronic systolic heart failure, NYHA class 2 (HCC) 02/03/2017  . Diabetes mellitus type 2 in obese (HCC) 02/03/2017  . CAP (community acquired pneumonia) 02/03/2017  . Lobar pneumonia (HCC)   . Diabetes mellitus with complication (HCC)   . Acute on chronic respiratory failure with hypoxia (HCC)   . Depression 05/19/2016  . Gout 05/19/2016  . 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 NSVT (nonsustained ventricular tachycardia) (HCC)   . PE (pulmonary embolism)   . Acute respiratory failure with hypoxia (HCC)   .  Osteoarthritis of left hip 10/25/2013  . Atrial fibrillation (HCC) 10/25/2013  . S/P hip replacement 10/24/2013  . Nonischemic cardiomyopathy (HCC)   . Obesity (BMI 30-39.9)   . Essential hypertension   . Hyperlipidemia     Past Surgical History:  Procedure Laterality Date  . CARDIAC CATHETERIZATION  1980's  . CIRCUMCISION  1940's  . CYSTOSCOPY WITH URETHRAL DILATATION  10/23/2013  . CYSTOSCOPY WITH URETHRAL DILATATION N/A 10/23/2013   Procedure: CYSTOSCOPY WITH URETHRAL DILATATION;  Surgeon: Kathi Ludwig, MD;  Location: [facility] OR;  Service: Urology;  Laterality: N/A;  . INCISION AND DRAINAGE OF WOUND Right 1980's   "cat scratch" (10/23/2013)  . INGUINAL HERNIA REPAIR Right 1950's  . KIDNEY STONE SURGERY Right 1970's; 1983   "twice"  . TONSILLECTOMY AND ADENOIDECTOMY  1940's  . TOTAL HIP ARTHROPLASTY Left 10/23/2013  . TOTAL HIP ARTHROPLASTY Left 10/23/2013   Procedure: TOTAL HIP ARTHROPLASTY;  Surgeon: Valeria Batman, MD;  Location: [facility] OR;  Service: Orthopedics;  Laterality: Left;       Home Medications    Prior to Admission medications   Medication Sig Start Date End Date Taking? Authorizing Provider  allopurinol (ZYLOPRIM) 100 MG tablet Take 200 mg by mouth daily.    Historical Provider, MD  apixaban (ELIQUIS) 5 MG TABS tablet Take 1 tablet (5 mg total)  by mouth 2 (two) times daily. 02/06/17   Ankit Joline Maxcy, MD  aspirin EC 81 MG tablet Take 81 mg by mouth daily.    Historical Provider, MD  carvedilol (COREG) 12.5 MG tablet Take 12.5 mg by mouth 2 (two) times daily with a meal.    Historical Provider, MD  colchicine 0.6 MG tablet Take 1 tablet (0.6 mg total) by mouth daily. Patient not taking: Reported on 11/30/2016 04/09/16 11/30/16  Isa Rankin, MD  cyanocobalamin 1000 MCG tablet Take 1,000 mcg by mouth daily.     Historical Provider, MD  diphenhydrAMINE (BENADRYL) 25 mg capsule Take 25 mg by mouth at bedtime as needed for sleep.    Historical Provider, MD    finasteride (PROSCAR) 5 MG tablet Take 5 mg by mouth daily.    Historical Provider, MD  furosemide (LASIX) 40 MG tablet Take 1 tablet (40 mg total) by mouth 2 (two) times daily. 05/31/16   Janetta Hora, PA-C  glipiZIDE (GLUCOTROL) 5 MG tablet Take 5 mg by mouth daily before breakfast.    Historical Provider, MD  hydrALAZINE (APRESOLINE) 25 MG tablet Take 25 mg by mouth 3 (three) times daily.    Historical Provider, MD  HYDROcodone-acetaminophen (NORCO/VICODIN) 5-325 MG tablet Take 2 tablets by mouth every 4 (four) hours as needed. Patient taking differently: Take 1 tablet by mouth 2 (two) times daily as needed (for back pain).  04/09/16   Isa Rankin, MD  isosorbide dinitrate (DILATRATE-SR) 40 MG CR capsule Take 40 mg by mouth 3 (three) times daily.    Historical Provider, MD  ketoconazole (NIZORAL) 2 % shampoo Apply 1 application topically 2 (two) times a week.    Historical Provider, MD  oxyCODONE (ROXICODONE) 5 MG immediate release tablet Take 1 tablet (5 mg total) by mouth every 6 (six) hours as needed for breakthrough pain. Patient not taking: Reported on 11/30/2016 09/10/16   Lyndal Pulley, MD  sertraline (ZOLOFT) 25 MG tablet Take 50 mg by mouth daily as needed (for anxiety).     Historical Provider, MD  simvastatin (ZOCOR) 80 MG tablet Take 40 mg by mouth daily.    Historical Provider, MD  spironolactone (ALDACTONE) 25 MG tablet Take 25 mg by mouth daily.    Historical Provider, MD  tamsulosin (FLOMAX) 0.4 MG CAPS capsule Take 0.4 mg by mouth at bedtime.    Historical Provider, MD    Family History Family History  Problem Relation Age of Onset  . Heart Problems Maternal Grandmother   . Other      negative for premature CAD    Social History Social History  Substance Use Topics  . Smoking status: Former Smoker    Packs/day: 1.00    Years: 15.00    Types: Cigarettes, Cigars  . Smokeless tobacco: Former Neurosurgeon     Comment: quit smoking ~ 40 yr ago  . Alcohol use No     Comment:  rare beer.     Allergies   Lisinopril   Review of Systems Review of Systems Unable to obtain ROS 2/2 respiratory distress  Physical Exam Updated Vital Signs BP 134/66   Pulse 85   Temp 98.1 F (36.7 C) (Axillary)   Resp (!) 21   Wt 254 lb (115.2 kg)   SpO2 98%   BMI 38.62 kg/m   Physical Exam  Constitutional: He is oriented to person, place, and time. He appears well-developed and well-nourished. He appears distressed.  Severely dyspneic  HENT:  Head: Normocephalic  and atraumatic.  Eyes: Conjunctivae are normal. Pupils are equal, round, and reactive to light.  Neck: Neck supple.  Cardiovascular: Normal heart sounds.  An irregularly irregular rhythm present. Tachycardia present.   No murmur heard. Pulmonary/Chest: He is in respiratory distress. He has rales.  Crackles in bases, accessory muscle use  Abdominal: Soft. Bowel sounds are normal. He exhibits no distension. There is no tenderness.  Musculoskeletal: He exhibits edema (mild BLE).  Neurological: He is alert and oriented to person, place, and time.  Fluent speech  Skin: Skin is warm. He is diaphoretic.  Nursing note and vitals reviewed.    ED Treatments / Results  Labs (all labs ordered are listed, but only abnormal results are displayed) Labs Reviewed  CBC - Abnormal; Notable for the following:       Result Value   WBC 11.9 (*)    All other components within normal limits  BRAIN NATRIURETIC PEPTIDE - Abnormal; Notable for the following:    B Natriuretic Peptide 377.3 (*)    All other components within normal limits  COMPREHENSIVE METABOLIC PANEL - Abnormal; Notable for the following:    Potassium 3.4 (*)    Glucose, Bld 204 (*)    Albumin 3.3 (*)    GFR calc non Af Amer 53 (*)    All other components within normal limits  I-STAT CG4 LACTIC ACID, ED - Abnormal; Notable for the following:    Lactic Acid, Venous 2.79 (*)    All other components within normal limits  I-STAT ARTERIAL BLOOD GAS, ED -  Abnormal; Notable for the following:    pH, Arterial 7.332 (*)    pO2, Arterial 118.0 (*)    All other components within normal limits  CULTURE, BLOOD (ROUTINE X 2)  CULTURE, BLOOD (ROUTINE X 2)  I-STAT TROPOININ, ED    EKG  EKG Interpretation  Date/Time:  Wednesday March 23 2017 13:13:11 EDT Ventricular Rate:  94 PR Interval:    QRS Duration: 103 QT Interval:  354 QTC Calculation: 443 R Axis:   42 Text Interpretation:  Sinus tachycardia Ventricular bigeminy Short PR interval Low voltage, precordial leads Abnormal R-wave progression, early transition Nonspecific T abnrm, anterolateral leads tachycardia with diffuse T wave flattening compared to old EKG Confirmed by Yohana Bartha MD, Hoyt Leanos [phone]) on 03/23/2017 1:46:28 PM       Radiology Dg Chest Portable 1 View  Result Date: 03/23/2017 CLINICAL DATA:  Severe shortness of breath.  History of CHF. EXAM: PORTABLE CHEST 1 VIEW COMPARISON:  02/04/2017. FINDINGS: Single lead pacemaker appears unchanged, LEFT subclavian approach, tip in RIGHT ventricle. Moderate vascular congestion, early CHF, with cardiomegaly, increased from priors. Increased cardiomediastinal silhouette. Negative osseous structures. IMPRESSION: Early CHF. Electronically Signed   By: Elsie Stain M.D.   On: 03/23/2017 13:14    Procedures .Critical Care Performed by: Laurence Spates Authorized by: Laurence Spates   Critical care provider statement:    Critical care time (minutes):  40   Critical care time was exclusive of:  Separately billable procedures and treating other patients   Critical care was necessary to treat or prevent imminent or life-threatening deterioration of the following conditions:  Respiratory failure   Critical care was time spent personally by me on the following activities:  Development of treatment plan with patient or surrogate, evaluation of patient's response to treatment, examination of patient, obtaining history from patient or  surrogate, ordering and performing treatments and interventions, ordering and review of laboratory studies, ordering and review of  radiographic studies, re-evaluation of patient's condition and review of old charts    (including critical care time)  Medications Ordered in ED Medications  nitroGLYCERIN 0.2 mg/mL in dextrose 5 % infusion (not administered)  nitroGLYCERIN (NITROGLYN) 2 % ointment 1 inch (1 inch Topical Given 03/23/17 1300)  methylPREDNISolone sodium succinate (SOLU-MEDROL) 125 mg/2 mL injection 125 mg (125 mg Intravenous Given 03/23/17 1306)  nitroGLYCERIN 50 mg in dextrose 5 % 250 mL (0.2 mg/mL) infusion (5 mcg/min Intravenous Rate/Dose Change 03/23/17 1345)  albuterol (PROVENTIL) (2.5 MG/3ML) 0.083% nebulizer solution (5 mg  Given 03/23/17 1250)  fentaNYL (SUBLIMAZE) injection 25 mcg (25 mcg Intravenous Given 03/23/17 1426)     Initial Impression / Assessment and Plan / ED Course  I have reviewed the triage vital signs and the nursing notes.  Pertinent labs & imaging results that were available during my care of the patient were reviewed by me and considered in my medical decision making (see chart for details).     PT w/ h/o COPD and CHF EF 15-20% p/w Sudden onset of shortness of breath this morning. He was diaphoretic, severely dyspneic, and in respiratory distress on arrival. He had crackles in bilateral bases. Immediately placed on BiPAP and gave nitroglycerin paste. Also gave albuterol and DuoNeb. Initiated a nitro drip for suspicion of CHF given severe hypertension on initial exam. Patient quickly improved on BiPAP and nitroglycerin.  CXR shows CHF changes. Initial lactate 2.8, initial troponin negative, ABG reassuring at 7.33 with CO2 of 47. Normal creatinine. BNP is 377 although clinically and based on chest x-ray he appears to be having pulmonary edema secondary to CHF. The patient has improved since arriving to the ED. I feel PE is unlikely explanation for symptoms given  that he is anticoagulated with eliquis. His chest admission with [facility], PA [facility], and pt admitted to step down for further care.  Final Clinical Impressions(s) / ED Diagnoses   Final diagnoses:  None    New Prescriptions New Prescriptions   No medications on file     Laurence Spates, MD 03/23/17 1635

## 2017-03-23 NOTE — H&P (Signed)
History and Physical    Ryan Macdonald:[id number] DOB: 02-Dec-1933 DOA: 03/23/2017  PCP: [facility]  Patient coming from: home  Chief Complaint:Progressive SOB  HPI: Ryan Macdonald is a 83 y.o. 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 Patient said that today around 11:00 in the morning And was started feeling short of breath. Dyspnea was worsening fast despite him taking extra dose of Lasix. Patient called EMS and was delivered to she is a [facility] ED. In the Route he was ordered to be placed on BiPAP but refused, nevertheless on arrival to the ED he needed BiPAP that significantly improved the work of breathing  ED Course: Upon arrival he was tachycardic and tachypnea, hypertension initially was 208/156 mmHg, but improved after he was given nitroglycerin IV. Patient was given 40 mg Lasix  IV, but didn't have significant urinary output yet. Vital signs demonstrated white blood cells count 11,900, potassium 3.4 glucose 204, lactic acid 2.7-9 BNP was elevated at 377.3 and first troponin was normal 0.04. EKG revealed unclear rhythm A. fib versus small with some ischemia, no acute ST-T wave changes.  X-ray demonstrated pacemaker lead in place, moderate vascular congestion consistent with early CHF,  cardiomegaly; comparison with the prior x-ray demonstrated increased cardiomediastinal silhouette from priors  Review of Systems: As per HPI otherwise 10 point review of systems negative.   Ambulatory Status: Independent  Past Medical History:  Diagnosis Date  . Cat scratch fever    removed mass on right side neck  . Cataracts, bilateral   . CHF (congestive heart  failure) (HCC)   . Diabetes mellitus without complication (HCC)    TYPE 2  . High cholesterol   . Hypertension   . Kidney stones   . Morbid obesity (HCC)   . Nonischemic cardiomyopathy (HCC)    a. ? 2009 Cath in [place] - nl cors per pt;  b. 09/2013 Echo: EF 25-30%, sev diff HK.  . Osteoarthritis   . PAF (paroxysmal atrial fibrillation) (HCC)    a. post-op hip in 2014 - prev on xarelto.  . Shortness of breath dyspnea     Past Surgical History:  Procedure Laterality Date  . CARDIAC CATHETERIZATION  1980's  . CIRCUMCISION  1940's  . CYSTOSCOPY WITH URETHRAL DILATATION  10/23/2013  . CYSTOSCOPY WITH URETHRAL DILATATION N/A 10/23/2013   Procedure: CYSTOSCOPY WITH URETHRAL DILATATION;  Surgeon: Kathi Ludwig, MD;  Location: [facility] OR;  Service: Urology;  Laterality: N/A;  . INCISION AND DRAINAGE OF WOUND Right 1980's   "cat scratch" (10/23/2013)  . INGUINAL HERNIA REPAIR Right 1950's  . KIDNEY STONE SURGERY Right 1970's; 1983   "twice"  . TONSILLECTOMY AND ADENOIDECTOMY  1940's  . TOTAL HIP ARTHROPLASTY Left 10/23/2013  . TOTAL HIP ARTHROPLASTY Left 10/23/2013   Procedure: TOTAL HIP ARTHROPLASTY;  Surgeon: Valeria Batman, MD;  Location: [facility] OR;  Service: Orthopedics;  Laterality: Left;    Social History   Social History  . Marital status: Married    Spouse name: N/A  . Number of children: N/A  . Years of education: N/A   Occupational History  . Not on file.   Social History Main Topics  . Smoking status: Former Smoker    Packs/day: 1.00  Years: 15.00    Types: Cigarettes, Cigars  . Smokeless tobacco: Former Neurosurgeon     Comment: quit smoking ~ 40 yr ago  . Alcohol use No     Comment: rare beer.  . Drug use: No  . Sexual activity: Yes   Other Topics Concern  . Not on file   Social History Narrative   Lives in [place] with wife.  Does not routinely exercise.    Allergies  Allergen Reactions  . Lisinopril Swelling    Family History  Problem Relation Age of Onset   . Heart Problems Maternal Grandmother   . Other      negative for premature CAD    Prior to Admission medications   Medication Sig Start Date End Date Taking? Authorizing Provider  allopurinol (ZYLOPRIM) 100 MG tablet Take 200 mg by mouth daily.    Historical Provider, MD  apixaban (ELIQUIS) 5 MG TABS tablet Take 1 tablet (5 mg total) by mouth 2 (two) times daily. 02/06/17   Ankit Joline Maxcy, MD  aspirin EC 81 MG tablet Take 81 mg by mouth daily.    Historical Provider, MD  carvedilol (COREG) 12.5 MG tablet Take 12.5 mg by mouth 2 (two) times daily with a meal.    Historical Provider, MD  colchicine 0.6 MG tablet Take 1 tablet (0.6 mg total) by mouth daily. Patient not taking: Reported on 11/30/2016 04/09/16 11/30/16  Isa Rankin, MD  cyanocobalamin 1000 MCG tablet Take 1,000 mcg by mouth daily.     Historical Provider, MD  diphenhydrAMINE (BENADRYL) 25 mg capsule Take 25 mg by mouth at bedtime as needed for sleep.    Historical Provider, MD  finasteride (PROSCAR) 5 MG tablet Take 5 mg by mouth daily.    Historical Provider, MD  furosemide (LASIX) 40 MG tablet Take 1 tablet (40 mg total) by mouth 2 (two) times daily. 05/31/16   Janetta Hora, PA-C  glipiZIDE (GLUCOTROL) 5 MG tablet Take 5 mg by mouth daily before breakfast.    Historical Provider, MD  hydrALAZINE (APRESOLINE) 25 MG tablet Take 25 mg by mouth 3 (three) times daily.    Historical Provider, MD  HYDROcodone-acetaminophen (NORCO/VICODIN) 5-325 MG tablet Take 2 tablets by mouth every 4 (four) hours as needed. Patient taking differently: Take 1 tablet by mouth 2 (two) times daily as needed (for back pain).  04/09/16   Isa Rankin, MD  isosorbide dinitrate (DILATRATE-SR) 40 MG CR capsule Take 40 mg by mouth 3 (three) times daily.    Historical Provider, MD  ketoconazole (NIZORAL) 2 % shampoo Apply 1 application topically 2 (two) times a week.    Historical Provider, MD  oxyCODONE (ROXICODONE) 5 MG immediate release tablet Take 1 tablet  (5 mg total) by mouth every 6 (six) hours as needed for breakthrough pain. Patient not taking: Reported on 11/30/2016 09/10/16   Lyndal Pulley, MD  sertraline (ZOLOFT) 25 MG tablet Take 50 mg by mouth daily as needed (for anxiety).     Historical Provider, MD  simvastatin (ZOCOR) 80 MG tablet Take 40 mg by mouth daily.    Historical Provider, MD  spironolactone (ALDACTONE) 25 MG tablet Take 25 mg by mouth daily.    Historical Provider, MD  tamsulosin (FLOMAX) 0.4 MG CAPS capsule Take 0.4 mg by mouth at bedtime.    Historical Provider, MD    Physical Exam: Vitals:   03/23/17 1445 03/23/17 1515 03/23/17 1530 03/23/17 1545  BP: 129/84 126/76 123/80 116/70  Pulse: 87  85 76 83  Resp: 16 19 18  (!) 21  Temp:      TempSrc:      SpO2: 98% 99% 99% 100%  Weight:         General: Appears calm and comfortable Eyes: PERRLA, EOMI, normal lids, iris ENT:  grossly normal hearing, lips & tongue, mucous membranes moist and intact Neck: no lymphoadenopathy, masses or thyromegaly Cardiovascular: RRR, no m/r/g. No JVD, carotid bruits. 2+ large LE edema from pretibial region extending to thighs.  Respiratory: bilateral wheezes, rales, rhonchi. Normal respiratory effort. No accessory muscle use observed Abdomen: soft, non-tender, non-distended, no organomegaly or masses appreciated. BS present in all quadrants Skin: no rash, ulcers or induration seen on limited exam Musculoskeletal: grossly normal tone BUE/BLE, good ROM, no bony abnormality or joint deformities observed Psychiatric: grossly normal mood and affect, speech fluent and appropriate, alert and oriented x3 Neurologic: CN II-XII grossly intact, moves all extremities in coordinated fashion, sensation intact  Labs on Admission: I have personally reviewed following labs and imaging studies  CBC, BMP  GFR: Estimated Creatinine Clearance: 58.5 mL/min (by C-G formula based on SCr of 1.24 mg/dL).   Creatinine Clearance: Estimated Creatinine  Clearance: 58.5 mL/min (by C-G formula based on SCr of 1.24 mg/dL).    Radiological Exams on Admission: Dg Chest Portable 1 View  Result Date: 03/23/2017 CLINICAL DATA:  Severe shortness of breath.  History of CHF. EXAM: PORTABLE CHEST 1 VIEW COMPARISON:  02/04/2017. FINDINGS: Single lead pacemaker appears unchanged, LEFT subclavian approach, tip in RIGHT ventricle. Moderate vascular congestion, early CHF, with cardiomegaly, increased from priors. Increased cardiomediastinal silhouette. Negative osseous structures. IMPRESSION: Early CHF. Electronically Signed   By: Elsie Stain M.D.   On: 03/23/2017 13:14    EKG: Independently reviewed -  unclear rhythm A. fib versus small with some ischemia, no acute ST-T wave changes  Assessment/Plan Principal Problem:   Acute respiratory failure with hypoxia (HCC) Active Problems:   Nonischemic cardiomyopathy (HCC)   Essential hypertension   Hyperlipidemia   ICD (implantable cardioverter-defibrillator) in place   Acute on chronic combined systolic and diastolic CHF (congestive heart failure) (HCC)   Depression   Gout   Diabetes mellitus type 2 in obese (HCC)    Acute respiratory failure with hypoxia - on arrival patient was in respiratory distress and required placement on BiPAP After Solu-Medrol was given and wheezing subsided patient condition improved. We'll try to wean off BiPAP as tolerated.  Acute on chronic combined systolic and diastolic CHF We'll continue IV diuresis and increased doses-Lasix 60 mg twice a day Monitor urinary output and daily weight Continue fluid restrictions and low salt diet, beta blocker, hydralazine and oral nitrate, but Aldactone will be on hold for now while he is on IV Lasix His Lactic acid was elevated, but it is doubtful that patient has sepsis. Will check Procalcitonin level  Nonischemic cardiomyopathy with low EF - status post AICD placement Patient is currently pain free. Continue to monitory  Diabetes  mellitus type II - glipizide will be on hold and will start sliding scale insulin, continue carb controlled diet and monitor CBGs every before meals and at bedtime  Hypertension - currently stable Continue home medication and adjust the doses if needed depending on the BP readings  Hyperlipidemia  Continue statin therapy  Depression - continue Zoloft  Gout - continue Zyloprim   DVT prophylaxis: Eliquis Code Status: full Family Communication: at bedside Disposition Plan: stepdown Consults called: none Admission status: inpatient   [facility]  Kinnie Feil Pager: [phone] [facility]  If 7PM-7AM, please contact night-coverage [email or web address] Password [email or web address]  03/23/2017, 4:11 PM

## 2017-03-23 NOTE — ED Triage Notes (Signed)
Per [facility], pt from home, called out for sudden onset SOB at 11, pt is diaphoretic, tachypnic, refused to be placed on CPAP. Pt on NRB, rales, audible wheezing. Pt given 1 nitro. Hx of COPD, CHF. BP 188/112.

## 2017-03-23 NOTE — ED Notes (Signed)
Attempted report 

## 2017-03-23 NOTE — ED Notes (Signed)
Repeat EKG given to Dr. Clarene Duke, Dr. Clarene Duke notified of lactic acid.

## 2017-03-23 NOTE — ED Notes (Signed)
Pt breathing much better at this time.

## 2017-03-23 NOTE — Progress Notes (Signed)
Patient arrived via EMS on non-rebreather mask.  Patient was noted to be in respiratory distress with audible wheezing.  Patient also noted to be diaphoretic.  Placed patient on bipap and gave patient 5mg  dose of Albuterol.  Sats were reading at 100% on bipap and patient work of breathing began to improve once placed on bipap.  ABG was obtained and results were given to MD.  Will continue to monitor.   Ref. Range 03/23/2017 13:08  Sample type Unknown ARTERIAL  pH, Arterial Latest Ref Range: 7.350 - 7.450  7.332 (L)  pCO2 arterial Latest Ref Range: 32.0 - 48.0 mmHg 46.6  pO2, Arterial Latest Ref Range: 83.0 - 108.0 mmHg 118.0 (H)  TCO2 Latest Ref Range: 0 - 100 mmol/L 26  Acid-base deficit Latest Ref Range: 0.0 - 2.0 mmol/L 2.0  Bicarbonate Latest Ref Range: 20.0 - 28.0 mmol/L 24.8  O2 Saturation Latest Units: % 98.0  Patient temperature Unknown 98.1 F  Collection site Unknown RADIAL, ALLEN'S T.Marland KitchenMarland Kitchen

## 2017-03-23 NOTE — Progress Notes (Signed)
Patient taken off of bipap and placed on 4L nasal cannula.  Currently tolerating well.  Will continue to monitor.  

## 2017-03-23 NOTE — ED Notes (Signed)
CBG-165 

## 2017-03-23 NOTE — ED Notes (Signed)
Pt breathing not as labored

## 2017-03-23 NOTE — ED Notes (Signed)
Pt eating diet tray

## 2017-03-23 NOTE — ED Notes (Signed)
Pt's CBG 165.  Informed Inetta Fermo, RN.

## 2017-03-23 NOTE — ED Notes (Signed)
Per Dr. Konrad Dolores, to stop the nitro drip

## 2017-03-24 DIAGNOSIS — E669 Obesity, unspecified: Secondary | ICD-10-CM

## 2017-03-24 DIAGNOSIS — I428 Other cardiomyopathies: Secondary | ICD-10-CM

## 2017-03-24 DIAGNOSIS — J9601 Acute respiratory failure with hypoxia: Secondary | ICD-10-CM

## 2017-03-24 DIAGNOSIS — E784 Other hyperlipidemia: Secondary | ICD-10-CM

## 2017-03-24 DIAGNOSIS — Z95810 Presence of automatic (implantable) cardiac defibrillator: Secondary | ICD-10-CM

## 2017-03-24 DIAGNOSIS — N183 Chronic kidney disease, stage 3 (moderate): Secondary | ICD-10-CM

## 2017-03-24 DIAGNOSIS — E1122 Type 2 diabetes mellitus with diabetic chronic kidney disease: Secondary | ICD-10-CM

## 2017-03-24 DIAGNOSIS — E1169 Type 2 diabetes mellitus with other specified complication: Secondary | ICD-10-CM

## 2017-03-24 DIAGNOSIS — E872 Acidosis: Secondary | ICD-10-CM

## 2017-03-24 DIAGNOSIS — I10 Essential (primary) hypertension: Secondary | ICD-10-CM

## 2017-03-24 DIAGNOSIS — I5043 Acute on chronic combined systolic (congestive) and diastolic (congestive) heart failure: Secondary | ICD-10-CM

## 2017-03-24 LAB — COMPREHENSIVE METABOLIC PANEL
ALK PHOS: 54 U/L (ref 38–126)
ALT: 20 U/L (ref 17–63)
ANION GAP: 9 (ref 5–15)
AST: 29 U/L (ref 15–41)
Albumin: 3.2 g/dL — ABNORMAL LOW (ref 3.5–5.0)
BUN: 25 mg/dL — ABNORMAL HIGH (ref 6–20)
CALCIUM: 9 mg/dL (ref 8.9–10.3)
CO2: 26 mmol/L (ref 22–32)
Chloride: 105 mmol/L (ref 101–111)
Creatinine, Ser: 1.37 mg/dL — ABNORMAL HIGH (ref 0.61–1.24)
GFR calc non Af Amer: 47 mL/min — ABNORMAL LOW (ref >60)
GFR, EST AFRICAN AMERICAN: 55 mL/min — AB (ref >60)
Glucose, Bld: 177 mg/dL — ABNORMAL HIGH (ref 65–99)
POTASSIUM: 3.5 mmol/L (ref 3.5–5.1)
SODIUM: 140 mmol/L (ref 135–145)
TOTAL PROTEIN: 6.9 g/dL (ref 6.5–8.1)
Total Bilirubin: 0.7 mg/dL (ref 0.3–1.2)

## 2017-03-24 LAB — CBC
HEMATOCRIT: 36.2 % — AB (ref 39.0–52.0)
HEMOGLOBIN: 11.9 g/dL — AB (ref 13.0–17.0)
MCH: 30.4 pg (ref 26.0–34.0)
MCHC: 32.9 g/dL (ref 30.0–36.0)
MCV: 92.6 fL (ref 78.0–100.0)
Platelets: 263 10*3/uL (ref 150–400)
RBC: 3.91 MIL/uL — AB (ref 4.22–5.81)
RDW: 14.4 % (ref 11.5–15.5)
WBC: 10.8 10*3/uL — ABNORMAL HIGH (ref 4.0–10.5)

## 2017-03-24 LAB — MRSA PCR SCREENING: MRSA BY PCR: NEGATIVE

## 2017-03-24 LAB — GLUCOSE, CAPILLARY
GLUCOSE-CAPILLARY: 252 mg/dL — AB (ref 65–99)
GLUCOSE-CAPILLARY: 149 mg/dL — AB (ref 65–99)
Glucose-Capillary: 167 mg/dL — ABNORMAL HIGH (ref 65–99)
Glucose-Capillary: 162 mg/dL — ABNORMAL HIGH (ref 65–99)

## 2017-03-24 LAB — MAGNESIUM: MAGNESIUM: 1.9 mg/dL (ref 1.7–2.4)

## 2017-03-24 MED ORDER — FUROSEMIDE 40 MG PO TABS
40.0000 mg | ORAL_TABLET | Freq: Every day | ORAL | Status: DC
  Administered 2017-03-24: 40 mg via ORAL
  Filled 2017-03-24: qty 1

## 2017-03-24 MED ORDER — FUROSEMIDE 40 MG PO TABS: 40.0000 mg | ORAL_TABLET | Freq: Two times a day (BID) | ORAL | Status: DC

## 2017-03-24 MED ORDER — FUROSEMIDE 40 MG PO TABS
60.0000 mg | ORAL_TABLET | Freq: Every day | ORAL | Status: DC
  Administered 2017-03-25: 09:00:00 60 mg via ORAL
  Filled 2017-03-24: qty 1

## 2017-03-24 MED ORDER — SENNOSIDES-DOCUSATE SODIUM 8.6-50 MG PO TABS
1.0000 | ORAL_TABLET | Freq: Two times a day (BID) | ORAL | Status: DC
  Administered 2017-03-24 – 2017-03-25 (×3): 1 via ORAL
  Filled 2017-03-24 (×3): qty 1

## 2017-03-24 MED ORDER — INSULIN ASPART 100 UNIT/ML ~~LOC~~ SOLN
3.0000 [IU] | Freq: Three times a day (TID) | SUBCUTANEOUS | Status: DC
Start: 2017-03-24 — End: 2017-03-25
  Administered 2017-03-24 – 2017-03-25 (×3): 3 [IU] via SUBCUTANEOUS

## 2017-03-24 NOTE — Evaluation (Signed)
Physical Therapy Evaluation Patient Details Name: Ryan Macdonald MRN: [id number] DOB: [birth year] Today's Date: 03/24/2017   History of Present Illness  Patient is a 83 y/o male with hx of CHF, NICM, EF 25-35%, PAF, HTN, DM, NSVT with defibrillator in place, PE, Obestiy presents with progressive SOB. Found to have hypertensive emergency and acute hypoxic respiratory failure secondary to fluid overload from CHF w/ elevated lactic acid likely from hypoxia.  Clinical Impression  Patient presents with dyspnea on exertion, decreased endurance, decreased activity tolerance and impaired mobility s/p above. Tolerated gait training with supervision for safety and to manage 02 tank and lines. Sp02 ranged from 87-100% on 2-3 L/min 02. Resolved quickly with standing rest breaks. Pt independent PTA and uses SPC PRN. Has support of wife. Plan for stair training tomorrow as tolerated. Will follow acutely to maximize independence and mobility prior to return home. Will continue to wean as tolerated.    Follow Up Recommendations No PT follow up;Supervision - Intermittent    Equipment Recommendations  None recommended by PT    Recommendations for Other Services       Precautions / Restrictions Precautions Precaution Comments: watch 02 Restrictions Weight Bearing Restrictions: No      Mobility  Bed Mobility Overal bed mobility: Modified Independent             General bed mobility comments: Increased time to get to EOB with heavy use of rail for support.  Transfers Overall transfer level: Needs assistance Equipment used: None Transfers: Sit to/from Stand Sit to Stand: Supervision         General transfer comment: Supervision for safety. Transferred to chair post ambulation bout.  Ambulation/Gait Ambulation/Gait assistance: Supervision Ambulation Distance (Feet): 150 Feet Assistive device: None Gait Pattern/deviations: Step-through pattern;Decreased stride length;Wide base of  support Gait velocity: decreased   General Gait Details: Slow, mostly steady gait with 2-3/4 DOE. Sp02 started on 3L- down to 2L/min 02 and ranged from 87-100%. Resolved quickly with cues for pursed lip breating. 2 standing rest breaks.  Stairs            Wheelchair Mobility    Modified Rankin (Stroke Patients Only)       Balance Overall balance assessment: Needs assistance Sitting-balance support: Feet supported;No upper extremity supported Sitting balance-Leahy Scale: Good     Standing balance support: During functional activity Standing balance-Leahy Scale: Fair Standing balance comment: Able to stand without UE support.                             Pertinent Vitals/Pain Pain Assessment: Faces Faces Pain Scale: Hurts little more Pain Location: chronic back pain Pain Descriptors / Indicators: Sore;Aching Pain Intervention(s): Monitored during session;Repositioned    Home Living Family/patient expects to be discharged to:: Private residence Living Arrangements: Spouse/significant other Available Help at Discharge: Family;Available 24 hours/day Type of Home: Apartment Home Access: Stairs to enter Entrance Stairs-Rails: Right Entrance Stairs-Number of Steps: 4 + 4 Home Layout: One level Home Equipment: Walker - 2 wheels;Cane - single point;Tub bench      Prior Function Level of Independence: Independent with assistive device(s)         Comments: Uses SPC PRN. Drives, cooks, cleans. No falls reported.      Hand Dominance   Dominant Hand: Right    Extremity/Trunk Assessment   Upper Extremity Assessment Upper Extremity Assessment: Defer to OT evaluation    Lower Extremity Assessment Lower Extremity Assessment: Overall WFL for  tasks assessed    Cervical / Trunk Assessment Cervical / Trunk Assessment: Normal  Communication   Communication: No difficulties  Cognition Arousal/Alertness: Awake/alert Behavior During Therapy: WFL for tasks  assessed/performed Overall Cognitive Status: Within Functional Limits for tasks assessed                                        General Comments General comments (skin integrity, edema, etc.): No family present during session.    Exercises     Assessment/Plan    PT Assessment Patient needs continued PT services  PT Problem List Decreased mobility;Cardiopulmonary status limiting activity;Pain;Decreased balance;Decreased activity tolerance       PT Treatment Interventions Therapeutic activities;Gait training;Therapeutic exercise;Patient/family education;Balance training;Functional mobility training    PT Goals (Current goals can be found in the Care Plan section)  Acute Rehab PT Goals Patient Stated Goal: to feel better and go home PT Goal Formulation: With patient Time For Goal Achievement: 04/07/17 Potential to Achieve Goals: Good    Frequency Min 3X/week   Barriers to discharge Inaccessible home environment stairs to enter home    Co-evaluation               End of Session Equipment Utilized During Treatment: Oxygen Activity Tolerance: Treatment limited secondary to medical complications (Comment) (drop in Sp02 with mobility.) Patient left: in chair;with call bell/phone within reach Nurse Communication: Mobility status PT Visit Diagnosis: Other (comment);Unsteadiness on feet (R26.81) (dyspnea)    Time: 1610-9604 PT Time Calculation (min) (ACUTE ONLY): 32 min   Charges:   PT Evaluation $PT Eval Moderate Complexity: 1 Procedure PT Treatments $Gait Training: 8-22 mins   PT G Codes:        Mylo Red, PT, DPT [phone]    Blake Divine A Ryan Macdonald 03/24/2017, 1:33 PM

## 2017-03-24 NOTE — Care Management Note (Addendum)
Case Management Note  Patient Details  Name: Ryan Macdonald MRN: [id number] Date of Birth: [birth year]  Subjective/Objective: Pt presented for Acute Respiratory Failure. Initiated on BIPAP, IV solumedrol and IV Lasix. Pt is from home with the support of wife. Wife at bedside during the time of conversation. Pt has PCP Dr. Cleon Dew at the [facility]. Fax number to office is [phone]. Pt does not use a local pharmacy-gets all medications from the [place]. CM did make [facility] April Lyn [place] [phone] Ext [phone]  transfer coordinator aware that pt is hospitalized. Voicemail left. Pt has DME cane and Rollator at home.                     Action/Plan: No needs identified at the time of visit. CM will continue to monitor. 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   Expected Discharge Date:                  Expected Discharge Plan:  Home/Self Care  In-House Referral:  NA  Discharge planning Services  CM Consult  Post Acute Care Choice:    Choice offered to:     DME Arranged:    DME Agency:     HH Arranged:    HH Agency:     Status of Service:  In process, will continue to follow  If discussed at Long Length of Stay Meetings, dates discussed:    Additional Comments:  Gala Lewandowsky, RN 03/24/2017, 3:40 PM

## 2017-03-24 NOTE — Consult Note (Signed)
Reason for Consult:   CHF  Requesting Physician: Dr Laural Benes Primary Cardiologist Dr Duke Salvia  HPI:   Ryan Macdonald is a 83 y.o. male who is being seen today for the evaluation of CHF at the request of Dr Laural Benes.  82 y.o.morbidly obese AA male with a history of chronic systolic and diastolic heart failure. He is followed at the [facility] and has seen Dr Duke Salvia in the office, LOV June 2017.  His last EF March 2018 was 15-20% by echo. He is s/p Biotronik ICD on 10/2015. He has h/o PAF, CHA2Ds2 VASc=5, pt declined anticoagulation secondary to a history of severe hematuria in the past but was placed on Eliquis in March 2018. Other problems include h/o HTN, HLD, CKD stage III and NSVT. Patient had history of cardiac catheterization in 2009 in [place] which showed clean coronaries. He tells me he had a sleep study in the past and doesn't have sleep apnea.   He is admitted now with acute respiratory failure secondary to acute on chronic systolic CHF. His B/P was elevated and the pt was in significant respiratory distress but declined Bi pap. The pt states he developed SOB rather suddenly 48 hours prior to admission. He denies LE edema or wgt gain. He is symptomatically improved after 700 cc diuresis. He denies any problems with his medications and states he had been doing well until 48 hrs ago.    PMHx:  Past Medical History:  Diagnosis Date  . Cat scratch fever    removed mass on right side neck  . Cataracts, bilateral   . CHF (congestive heart failure) (HCC)   . Diabetes mellitus without complication (HCC)    TYPE 2  . High cholesterol   . Hypertension   . Kidney stones   . Morbid obesity (HCC)   . Nonischemic cardiomyopathy (HCC)    a. ? 2009 Cath in [place] - nl cors per pt;  b. 09/2013 Echo: EF 25-30%, sev diff HK.  . Osteoarthritis   . PAF (paroxysmal atrial fibrillation) (HCC)    a. post-op hip in 2014 - prev on xarelto.  . Shortness of breath dyspnea     Past Surgical  History:  Procedure Laterality Date  . CARDIAC CATHETERIZATION  1980's  . CIRCUMCISION  1940's  . CYSTOSCOPY WITH URETHRAL DILATATION  10/23/2013  . CYSTOSCOPY WITH URETHRAL DILATATION N/A 10/23/2013   Procedure: CYSTOSCOPY WITH URETHRAL DILATATION;  Surgeon: Kathi Ludwig, MD;  Location: [facility] OR;  Service: Urology;  Laterality: N/A;  . INCISION AND DRAINAGE OF WOUND Right 1980's   "cat scratch" (10/23/2013)  . INGUINAL HERNIA REPAIR Right 1950's  . KIDNEY STONE SURGERY Right 1970's; 1983   "twice"  . TONSILLECTOMY AND ADENOIDECTOMY  1940's  . TOTAL HIP ARTHROPLASTY Left 10/23/2013  . TOTAL HIP ARTHROPLASTY Left 10/23/2013   Procedure: TOTAL HIP ARTHROPLASTY;  Surgeon: Valeria Batman, MD;  Location: [facility] OR;  Service: Orthopedics;  Laterality: Left;    SOCHx:  reports that he has quit smoking. His smoking use included Cigarettes and Cigars. He has a 15.00 pack-year smoking history. He has quit using smokeless tobacco. He reports that he does not drink alcohol or use drugs.  FAMHx: Family History  Problem Relation Age of Onset  . Heart Problems Maternal Grandmother   . Other      negative for premature CAD    ALLERGIES: Allergies  Allergen Reactions  . Lisinopril Swelling    ROS: Review of Systems:  General: negative for chills, fever, night sweats or weight changes.  Cardiovascular: negative for chest pain, edema, palpitations, paroxysmal nocturnal dyspnea  HEENT: negative for any visual disturbances, blindness, glaucoma Dermatological: negative for rash Respiratory: negative for cough, hemoptysis, or wheezing Urologic: negative for hematuria or dysuria Abdominal: negative for nausea, vomiting, diarrhea, bright red blood per rectum, melena, or hematemesis Neurologic: negative for visual changes, syncope, or dizziness Musculoskeletal: negative for back pain, joint pain, or swelling Psych: cooperative and appropriate All other systems reviewed and are otherwise  negative except as noted above.   HOME MEDICATIONS: Prior to Admission medications   Medication Sig Start Date End Date Taking? Authorizing Provider  allopurinol (ZYLOPRIM) 100 MG tablet Take 200 mg by mouth daily.   Yes Historical Provider, MD  apixaban (ELIQUIS) 5 MG TABS tablet Take 1 tablet (5 mg total) by mouth 2 (two) times daily. 02/06/17  Yes Ankit Joline Maxcy, MD  carvedilol (COREG) 12.5 MG tablet Take 12.5 mg by mouth 2 (two) times daily with a meal.   Yes Historical Provider, MD  cyanocobalamin 1000 MCG tablet Take 1,000 mcg by mouth daily.    Yes Historical Provider, MD  diphenhydrAMINE (BENADRYL) 25 mg capsule Take 25 mg by mouth at bedtime as needed for sleep.   Yes Historical Provider, MD  finasteride (PROSCAR) 5 MG tablet Take 5 mg by mouth daily.   Yes Historical Provider, MD  furosemide (LASIX) 40 MG tablet Take 1 tablet (40 mg total) by mouth 2 (two) times daily. 05/31/16  Yes Janetta Hora, PA-C  glipiZIDE (GLUCOTROL) 5 MG tablet Take 5 mg by mouth daily before breakfast.   Yes Historical Provider, MD  hydrALAZINE (APRESOLINE) 25 MG tablet Take 25 mg by mouth 3 (three) times daily.   Yes Historical Provider, MD  HYDROcodone-acetaminophen (NORCO/VICODIN) 5-325 MG tablet Take 2 tablets by mouth every 4 (four) hours as needed. Patient taking differently: Take 1 tablet by mouth 2 (two) times daily as needed (for back pain).  04/09/16  Yes Isa Rankin, MD  isosorbide dinitrate (DILATRATE-SR) 40 MG CR capsule Take 40 mg by mouth 3 (three) times daily.   Yes Historical Provider, MD  ketoconazole (NIZORAL) 2 % shampoo Apply 1 application topically 2 (two) times a week.   Yes Historical Provider, MD  oxyCODONE (ROXICODONE) 5 MG immediate release tablet Take 1 tablet (5 mg total) by mouth every 6 (six) hours as needed for breakthrough pain. 09/10/16  Yes Lyndal Pulley, MD  sertraline (ZOLOFT) 25 MG tablet Take 50 mg by mouth daily as needed (for anxiety).    Yes Historical Provider, MD    simvastatin (ZOCOR) 80 MG tablet Take 40 mg by mouth daily.   Yes Historical Provider, MD  spironolactone (ALDACTONE) 25 MG tablet Take 25 mg by mouth daily.   Yes Historical Provider, MD  tamsulosin (FLOMAX) 0.4 MG CAPS capsule Take 0.4 mg by mouth at bedtime.   Yes Historical Provider, MD  colchicine 0.6 MG tablet Take 1 tablet (0.6 mg total) by mouth daily. Patient not taking: Reported on 11/30/2016 04/09/16 11/30/16  Isa Rankin, MD    HOSPITAL MEDICATIONS: I have reviewed the patient's current medications.  VITALS: Blood pressure (!) 105/53, pulse 80, temperature 98 F (36.7 C), temperature source Oral, resp. rate 20, height 5\' 7"  (1.702 m), weight 257 lb 3.2 oz (116.7 kg), SpO2 96 %.  PHYSICAL EXAM: General appearance: alert, cooperative, no distress and morbidly obese Neck: no carotid bruit and no JVD Lungs: clear to  auscultation bilaterally Heart: irregularly irregular rhythm Abdomen: obese, non tender Extremities: no edema Pulses: 2+ and symmetric Skin: Skin color, texture, turgor normal. No rashes or lesions Neurologic: Grossly normal  LABS: Results for orders placed or performed during the hospital encounter of 03/23/17 (from the past 24 hour(s))  Magnesium     Status: Abnormal   Collection Time: 03/23/17  3:56 PM  Result Value Ref Range   Magnesium 1.5 (L) 1.7 - 2.4 mg/dL  Procalcitonin - Baseline     Status: None   Collection Time: 03/23/17  3:56 PM  Result Value Ref Range   Procalcitonin <0.10 ng/mL  I-Stat CG4 Lactic Acid, ED     Status: Abnormal   Collection Time: 03/23/17  4:04 PM  Result Value Ref Range   Lactic Acid, Venous 1.96 (HH) 0.5 - 1.9 mmol/L  CBG monitoring, ED     Status: Abnormal   Collection Time: 03/23/17  5:43 PM  Result Value Ref Range   Glucose-Capillary 165 (H) 65 - 99 mg/dL   Comment 1 Notify RN    Comment 2 Document in Chart   Glucose, capillary     Status: Abnormal   Collection Time: 03/23/17  9:32 PM  Result Value Ref Range    Glucose-Capillary 304 (H) 65 - 99 mg/dL  MRSA PCR Screening     Status: None   Collection Time: 03/23/17 11:01 PM  Result Value Ref Range   MRSA by PCR NEGATIVE NEGATIVE  Comprehensive metabolic panel     Status: Abnormal   Collection Time: 03/24/17  5:00 AM  Result Value Ref Range   Sodium 140 135 - 145 mmol/L   Potassium 3.5 3.5 - 5.1 mmol/L   Chloride 105 101 - 111 mmol/L   CO2 26 22 - 32 mmol/L   Glucose, Bld 177 (H) 65 - 99 mg/dL   BUN 25 (H) 6 - 20 mg/dL   Creatinine, Ser 1.61 (H) 0.61 - 1.24 mg/dL   Calcium 9.0 8.9 - 09.6 mg/dL   Total Protein 6.9 6.5 - 8.1 g/dL   Albumin 3.2 (L) 3.5 - 5.0 g/dL   AST 29 15 - 41 U/L   ALT 20 17 - 63 U/L   Alkaline Phosphatase 54 38 - 126 U/L   Total Bilirubin 0.7 0.3 - 1.2 mg/dL   GFR calc non Af Amer 47 (L) >60 mL/min   GFR calc Af Amer 55 (L) >60 mL/min   Anion gap 9 5 - 15  CBC     Status: Abnormal   Collection Time: 03/24/17  5:00 AM  Result Value Ref Range   WBC 10.8 (H) 4.0 - 10.5 K/uL   RBC 3.91 (L) 4.22 - 5.81 MIL/uL   Hemoglobin 11.9 (L) 13.0 - 17.0 g/dL   HCT 04.5 (L) 40.9 - 81.1 %   MCV 92.6 78.0 - 100.0 fL   MCH 30.4 26.0 - 34.0 pg   MCHC 32.9 30.0 - 36.0 g/dL   RDW 91.4 78.2 - 95.6 %   Platelets 263 150 - 400 K/uL  Magnesium     Status: None   Collection Time: 03/24/17  5:00 AM  Result Value Ref Range   Magnesium 1.9 1.7 - 2.4 mg/dL  Glucose, capillary     Status: Abnormal   Collection Time: 03/24/17  7:32 AM  Result Value Ref Range   Glucose-Capillary 167 (H) 65 - 99 mg/dL  Glucose, capillary     Status: Abnormal   Collection Time: 03/24/17 11:10 AM  Result Value  Ref Range   Glucose-Capillary 252 (H) 65 - 99 mg/dL    EKG: AF with PVCs  IMAGING: Dg Chest Portable 1 View  Result Date: 03/23/2017 CLINICAL DATA:  Severe shortness of breath.  History of CHF. EXAM: PORTABLE CHEST 1 VIEW COMPARISON:  02/04/2017. FINDINGS: Single lead pacemaker appears unchanged, LEFT subclavian approach, tip in RIGHT ventricle.  Moderate vascular congestion, early CHF, with cardiomegaly, increased from priors. Increased cardiomediastinal silhouette. Negative osseous structures. IMPRESSION: Early CHF. Electronically Signed   By: Elsie Stain M.D.   On: 03/23/2017 13:14    IMPRESSION:  1. Acute on chronic respiratory distress- by description of his admission symptoms the pt was close to being intubated  2. NICM- Last EF March 2018 was 15-20% by echo  3. HTN- markedly elevated B/P on admission but that was in the setting of acute respiratory distress, B/P stable now  4. CRI- SCr appears to be at bas[place]  5. Morbid obesity- BMI 40, pt denies sleep apnea, his wgt in the office June 2017 was 258 lbs.   6. DM-per primary team  7. CAF- he is now on Eliquis as of March 2018.     RECOMMENDATION: BNP only 377. Interestingly no history of wgt change. Pulmonary embolism not likely.... pt is now on Eliquis and recent CTA for an elevated D-dimer in Jan 2018 was negative for PE. Contniue diuresis, will follow  Time Spent Directly with Patient:  50 minutes  Corine Shelter, [place]  [phone] beeper 03/24/2017, 3:16 PM

## 2017-03-24 NOTE — Progress Notes (Signed)
Inpatient Diabetes Program Recommendations  AACE/ADA: New Consensus Statement on Inpatient Glycemic Control (2015)  Target Ranges:  Prepandial:   less than 140 mg/dL      Peak postprandial:   less than 180 mg/dL (1-2 hours)      Critically ill patients:  140 - 180 mg/dL   Results for KALEIL, HANSELL (MRN [id number]) as of 03/24/2017 11:27  Ref. Range 03/23/2017 17:43 03/23/2017 21:32 03/24/2017 07:32 03/24/2017 11:10  Glucose-Capillary Latest Ref Range: 65 - 99 mg/dL 128 (H) 786 (H) 767 (H) 252 (H)  Results for DELMUS, SCHLENDER (MRN [id number]) as of 03/24/2017 11:27  Ref. Range 02/03/2017 15:09  Hemoglobin A1C Latest Ref Range: 4.8 - 5.6 % 6.3 (H)   Review of Glycemic Control  Diabetes history: DM2 Outpatient Diabetes medications: Glipizide 5 mg QAM Current orders for Inpatient glycemic control: Novolog 0-9 units TID with meals, Novolog 0-5 units QHS  Inpatient Diabetes Program Recommendations:  Insulin-Correction: May want to consider increasing Novolog correction to moderate scale. Diet: Added Carb Modified to low sodium diet.  A1C: A1C 6.3% on 02/03/17 indicating good DM control.  NOTE: Noted patient received one dose of Solumedrol 125 mg on 03/23/17 which is contributing to hyperglycemia. No other steroids ordered.  Thanks, Orlando Penner, RN, MSN, CDE Diabetes Coordinator Inpatient Diabetes Program [phone] (Team Pager from 8am to 5pm)

## 2017-03-24 NOTE — Progress Notes (Signed)
PROGRESS NOTE    Ryan Macdonald  ZOX:[id number]  DOB: [birth year]  DOA: 03/23/2017 PCP: [facility] Cardiology:  [facility] course: Ryan Macdonald is a 83 y.o. male with medical history significant of combined (systolic and diastolic) CHF, nonischemic cardiomyopathy with with EF 20%, PAF- on anticoagulation therapy - Eliquis, NSVT with defibrillator in place, PE, diabetes mellitus type 2, hypertension, hyperlipidemia, morbid obesity who presented to the emergency department in acute respiratory distress and hypertensive urgency.    Assessment & Plan:   Acute respiratory failure with hypoxia - on arrival patient was in respiratory distress and required placement on BiPAP After Solu-Medrol was given and wheezing subsided patient condition improved. We'll try to wean off BiPAP as tolerated.  Acute on chronic combined systolic and diastolic CHF Resume home oral lasix today.   Monitor urinary output and daily weight Continue fluid restrictions and low salt diet, beta blocker, hydralazine and oral nitrate, but Aldactone will be on hold for now while he is on IV Lasix His Lactic acid was elevated, but it is doubtful that patient has sepsis. Procalcitonin level <0.10.    Nonischemic cardiomyopathy with low EF - status post AICD placement Patient is currently pain free. Continue to monitor, I asked for cardiology consult.    Diabetes mellitus type II - glipizide will be on hold and will start sliding scale insulin, continue carb controlled diet and monitor CBGs every before meals and at bedtime  CBG (last 3)   Recent Labs  03/23/17 2132 03/24/17 0732 03/24/17 1110  GLUCAP 304* 167* 252*   Hypertension - currently stable and much better controlled.  Continue home medication and adjust the doses if needed depending on the BP readings  Hyperlipidemia  Continue statin therapy  Depression - continue Zoloft  Gout - continue Zyloprim   DVT  prophylaxis: Eliquis Code Status: full Family Communication: at bedside Disposition Plan: stepdown Consults called: none Admission status: inpatient  Consultants:  cardiology  Subjective: Pt says that he is slowly starting to feel better but still has been SOB.   Objective: Vitals:   03/23/17 2028 03/23/17 2030 03/24/17 0013 03/24/17 0439  BP: 118/79  120/60 113/78  Pulse: 84 88 86 87  Resp: (!) 23 (!) 22 (!) 23 20  Temp: 98.1 F (36.7 C)  98.5 F (36.9 C) 98.3 F (36.8 C)  TempSrc: Oral  Oral Oral  SpO2: 95% 96% 95% 93%  Weight:  115.2 kg (254 lb)  116.7 kg (257 lb 3.2 oz)  Height:  5\' 7"  (1.702 m)  5\' 7"  (1.702 m)    Intake/Output Summary (Last 24 hours) at 03/24/17 0755 Last data filed at 03/24/17 0542  Gross per 24 hour  Intake              360 ml  Output             1000 ml  Net             -640 ml   Filed Weights   03/23/17 1251 03/23/17 2030 03/24/17 0439  Weight: 115.2 kg (254 lb) 115.2 kg (254 lb) 116.7 kg (257 lb 3.2 oz)    Exam:  General exam: awake, alert, NAD.  Respiratory system:  No increased work of breathing. Cardiovascular system: S1 & S2 heard.  Gastrointestinal system: Abdomen is nondistended, soft and nontender. Normal bowel sounds heard. Central nervous system: Alert and oriented. No focal neurological deficits. Extremities: no cyanosis.  Data Reviewed: Basic Metabolic Panel:  Recent  Labs Lab 03/23/17 1355 03/23/17 1556 03/24/17 0500  NA 142  --  140  K 3.4*  --  3.5  CL 107  --  105  CO2 25  --  26  GLUCOSE 204*  --  177*  BUN 19  --  25*  CREATININE 1.24  --  1.37*  CALCIUM 8.9  --  9.0  MG  --  1.5* 1.9   Liver Function Tests:  Recent Labs Lab 03/23/17 1355 03/24/17 0500  AST 32 29  ALT 23 20  ALKPHOS 50 54  BILITOT 0.6 0.7  PROT 7.2 6.9  ALBUMIN 3.3* 3.2*   No results for input(s): LIPASE, AMYLASE in the last 168 hours. No results for input(s): AMMONIA in the last 168 hours. CBC:  Recent Labs Lab  03/23/17 1300 03/24/17 0500  WBC 11.9* 10.8*  HGB 13.2 11.9*  HCT 39.7 36.2*  MCV 93.4 92.6  PLT 274 263   Cardiac Enzymes: No results for input(s): CKTOTAL, CKMB, CKMBINDEX, TROPONINI in the last 168 hours. CBG (last 3)   Recent Labs  03/23/17 1743 03/23/17 2132 03/24/17 0732  GLUCAP 165* 304* 167*   Recent Results (from the past 240 hour(s))  MRSA PCR Screening     Status: None   Collection Time: 03/23/17 11:01 PM  Result Value Ref Range Status   MRSA by PCR NEGATIVE NEGATIVE Final    Comment:        The GeneXpert MRSA Assay (FDA approved for NASAL specimens only), is one component of a comprehensive MRSA colonization surveillance program. It is not intended to diagnose MRSA infection nor to guide or monitor treatment for MRSA infections.      Studies: Dg Chest Portable 1 View  Result Date: 03/23/2017 CLINICAL DATA:  Severe shortness of breath.  History of CHF. EXAM: PORTABLE CHEST 1 VIEW COMPARISON:  02/04/2017. FINDINGS: Single lead pacemaker appears unchanged, LEFT subclavian approach, tip in RIGHT ventricle. Moderate vascular congestion, early CHF, with cardiomegaly, increased from priors. Increased cardiomediastinal silhouette. Negative osseous structures. IMPRESSION: Early CHF. Electronically Signed   By: Elsie Stain M.D.   On: 03/23/2017 13:14     Scheduled Meds: . apixaban  5 mg Oral BID  . atorvastatin  20 mg Oral q1800  . carvedilol  12.5 mg Oral BID WC  . finasteride  5 mg Oral Daily  . furosemide  40 mg Oral BID  . hydrALAZINE  25 mg Oral TID  . insulin aspart  0-5 Units Subcutaneous QHS  . insulin aspart  0-9 Units Subcutaneous TID WC  . isosorbide dinitrate  40 mg Oral TID  . spironolactone  25 mg Oral Daily   Continuous Infusions:  Principal Problem:   Acute respiratory failure with hypoxia (HCC) Active Problems:   Nonischemic cardiomyopathy (HCC)   Essential hypertension   Hyperlipidemia   ICD (implantable  cardioverter-defibrillator) in place   Acute on chronic combined systolic and diastolic CHF (congestive heart failure) (HCC)   Depression   Gout   Diabetes mellitus type 2 in obese (HCC)   Lactic acidosis   Time spent:   Standley Dakins, MD, FAAFP [facility] Pager [phone]  If 7PM-7AM, please contact night-coverage [email or web address] Password [email or web address] 03/24/2017, 7:55 AM    LOS: 1 day

## 2017-03-25 ENCOUNTER — Encounter (HOSPITAL_COMMUNITY): Payer: Self-pay | Admitting: *Deleted

## 2017-03-25 DIAGNOSIS — M109 Gout, unspecified: Secondary | ICD-10-CM

## 2017-03-25 LAB — BASIC METABOLIC PANEL
Anion gap: 10 (ref 5–15)
BUN: 31 mg/dL — AB (ref 6–20)
CO2: 28 mmol/L (ref 22–32)
CREATININE: 1.37 mg/dL — AB (ref 0.61–1.24)
Calcium: 9.2 mg/dL (ref 8.9–10.3)
Chloride: 103 mmol/L (ref 101–111)
GFR calc Af Amer: 55 mL/min — ABNORMAL LOW (ref >60)
GFR, EST NON AFRICAN AMERICAN: 47 mL/min — AB (ref >60)
Glucose, Bld: 123 mg/dL — ABNORMAL HIGH (ref 65–99)
POTASSIUM: 3.7 mmol/L (ref 3.5–5.1)
Sodium: 141 mmol/L (ref 135–145)

## 2017-03-25 LAB — GLUCOSE, CAPILLARY
Glucose-Capillary: 111 mg/dL — ABNORMAL HIGH (ref 65–99)
Glucose-Capillary: 145 mg/dL — ABNORMAL HIGH (ref 65–99)

## 2017-03-25 MED ORDER — FUROSEMIDE 20 MG PO TABS: ORAL_TABLET | ORAL | 0 refills | Status: DC

## 2017-03-25 NOTE — Progress Notes (Signed)
PT Note  Pt to DC home today. Pt feels he is mobilizing at baseline and amb earlier today with nursing. Offered to practice stairs but pt feels comfortable doing stairs at home as he has always done. Pt ready for dc home.  [employer] PT [phone]

## 2017-03-25 NOTE — Progress Notes (Signed)
Patient ambulated up and down the hall. On room air. Oxygen saturation dropped to 89% but quickly came up to 92-94% and has maintained. Denied any chest pain or shortness of breath while ambulating. Currently in bed, room air, sats 91-95%. Will continue to monitor.

## 2017-03-25 NOTE — Discharge Summary (Signed)
Physician Discharge Summary  Ryan Macdonald MSX:[id number] DOB: [birth year] DOA: 03/23/2017  PCP: [facility]   Dr. Keturah Barre [facility] Fax number to office is [phone]  Admit date: 03/23/2017 Discharge date: 03/25/2017  Admitted From: Home  Disposition:  Home   Recommendations for Outpatient Follow-up:  1. Follow up with PCP in 1 weeks 2. Please see cardiologist in 2 weeks 3. Please obtain BMP in 1 week  Discharge Condition: STABLE  CODE STATUS: FULL  Diet recommendation: Heart Healthy / Carb Modified   Brief/Interim Summary: HPI: Ryan Macdonald is a 83 y.o. 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 Patient said that today around 11:00 in the morning And was started feeling short of breath. Dyspnea was worsening fast despite him taking extra dose of Lasix. Patient called EMS and was delivered to she is a [facility]. In the Route he was ordered to be placed on BiPAP but refused, nevertheless on arrival to the ED he needed BiPAP that significantly improved the work of breathing  ED Course: Upon arrival he was tachycardic and tachypnea, hypertension initially was 208/156 mmHg, but improved after he was given nitroglycerin IV. Patient was given 40 mg Lasix  IV, but didn't have significant urinary output yet. Vital signs demonstrated white blood cells count 11,900, potassium 3.4 glucose 204, lactic acid 2.7-9 BNP was elevated at 377.3 and first troponin was normal 0.04. EKG revealed unclear rhythm A. fib versus small with some ischemia, no acute ST-T wave changes.  X-ray demonstrated pacemaker lead in place, moderate vascular congestion  consistent with early CHF,  cardiomegaly; comparison with the prior x-ray demonstrated increased cardiomediastinal silhouette from priors  Hospital course: Ryan Singletonis a 83 y.o.malewith medical history significant of combined (systolic and diastolic) CHF, nonischemic cardiomyopathy with with EF 20%, PAF- on anticoagulation therapy - Eliquis, NSVT withdefibrillator inplace, PE, diabetes mellitus type 2, hypertension, hyperlipidemia, morbid obesity who presented to the emergency department in acute respiratory distress and hypertensive urgency.    Assessment & Plan:   Acute respiratory failure with hypoxia - on arrival patient was in respiratory distress and required placement on BiPAP After Solu-Medrol was given and wheezing subsided patient condition improved. He was successfully weaned off bipap rapidly  Acute on chronic combined systolic and diastolic CHF Cardiology was consulted and recommended changing lasix dose to 60 mg AM and 40 mg PM   Pt did diurese about 1 liter and felt much better His Lactic acid was elevated, but it is doubtful that patient has sepsis. Procalcitonin level <0.10.    Nonischemic cardiomyopathy with low EF - status post AICD placement Patient is currently pain free. Continue to monitor, I asked for cardiology consult.  They recommended above changes and signed off.  Recommend patient follow up with outpatient cardiologist and PCP after discharge.   Diabetes mellitus type II -glipizide on hold in hospital but can resume at discharge   CBG (last 3)   Recent Labs  03/24/17 2110 03/25/17 0722 03/25/17 1113  GLUCAP 162* 111* 145*    Hypertension - currently stable and much better controlled.  Continue home medication and adjust the doses if needed depending on the BP readings  Hyperlipidemia  Continue statin therapy  Depression - continue Zoloft  Gout -  continue Zyloprim   DVT prophylaxis:Eliquis Code Status:full Family  Communication:at bedside Disposition Plan:Home  Consults called:cardiology Admission status:inpatient  Consultants:  cardiology  Discharge Diagnoses:  Principal Problem:   Acute respiratory failure with hypoxia (HCC) Active Problems:   Nonischemic cardiomyopathy (HCC)   Essential hypertension   Hyperlipidemia   ICD (implantable cardioverter-defibrillator) in place   Acute on chronic combined systolic and diastolic CHF (congestive heart failure) (HCC)   Depression   Gout   Diabetes mellitus type 2 in obese (HCC)   Lactic acidosis  Discharge Instructions  Discharge Instructions    Diet - low sodium heart healthy    Complete by:  As directed    Increase activity slowly    Complete by:  As directed      Allergies as of 03/25/2017      Reactions   Lisinopril Swelling      Medication List    STOP taking these medications   colchicine 0.6 MG tablet   oxyCODONE 5 MG immediate release tablet Commonly known as:  ROXICODONE     TAKE these medications   allopurinol 100 MG tablet Commonly known as:  ZYLOPRIM Take 200 mg by mouth daily.   apixaban 5 MG Tabs tablet Commonly known as:  ELIQUIS Take 1 tablet (5 mg total) by mouth 2 (two) times daily.   carvedilol 12.5 MG tablet Commonly known as:  COREG Take 12.5 mg by mouth 2 (two) times daily with a meal.   cyanocobalamin 1000 MCG tablet Take 1,000 mcg by mouth daily.   diphenhydrAMINE 25 mg capsule Commonly known as:  BENADRYL Take 25 mg by mouth at bedtime as needed for sleep.   finasteride 5 MG tablet Commonly known as:  PROSCAR Take 5 mg by mouth daily.   furosemide 20 MG tablet Commonly known as:  LASIX Take 3 tabs every morning and take 2 tabs every evening What changed:  medication strength  how much to take  how to take this  when to take this  additional instructions   glipiZIDE 5 MG tablet Commonly known as:  GLUCOTROL Take 5 mg by mouth daily before breakfast.   hydrALAZINE 25  MG tablet Commonly known as:  APRESOLINE Take 25 mg by mouth 3 (three) times daily.   HYDROcodone-acetaminophen 5-325 MG tablet Commonly known as:  NORCO/VICODIN Take 2 tablets by mouth every 4 (four) hours as needed. What changed:  how much to take  when to take this  reasons to take this   isosorbide dinitrate 40 MG CR capsule Commonly known as:  DILATRATE-SR Take 40 mg by mouth 3 (three) times daily.   ketoconazole 2 % shampoo Commonly known as:  NIZORAL Apply 1 application topically 2 (two) times a week.   sertraline 25 MG tablet Commonly known as:  ZOLOFT Take 50 mg by mouth daily as needed (for anxiety).   simvastatin 80 MG tablet Commonly known as:  ZOCOR Take 40 mg by mouth daily.   spironolactone 25 MG tablet Commonly known as:  ALDACTONE Take 25 mg by mouth daily.   tamsulosin 0.4 MG Caps capsule Commonly known as:  FLOMAX Take 0.4 mg by mouth at bedtime.      Follow-up Information    [facility]. Schedule an appointment as soon as possible for a visit in 1 week(s).   Specialty:  General Practice Contact information: [address] [place] [address] [phone]          Allergies  Allergen Reactions  . Lisinopril  Swelling   Procedures/Studies: Dg Chest Portable 1 View  Result Date: 03/23/2017 CLINICAL DATA:  Severe shortness of breath.  History of CHF. EXAM: PORTABLE CHEST 1 VIEW COMPARISON:  02/04/2017. FINDINGS: Single lead pacemaker appears unchanged, LEFT subclavian approach, tip in RIGHT ventricle. Moderate vascular congestion, early CHF, with cardiomegaly, increased from priors. Increased cardiomediastinal silhouette. Negative osseous structures. IMPRESSION: Early CHF. Electronically Signed   By: Elsie Stain M.D.   On: 03/23/2017 13:14    (Echo, Carotid, EGD, Colonoscopy, ERCP)    Subjective: Pt has been feeling much better and wanting to go home.  BPs much better. No CP, No SOB.   Discharge Exam: Vitals:   03/25/17  0446 03/25/17 0847  BP: (!) 122/101 124/75  Pulse: 77   Resp: 14   Temp: 97.9 F (36.6 C) 97.7 F (36.5 C)   Vitals:   03/25/17 0102 03/25/17 0152 03/25/17 0446 03/25/17 0847  BP:  107/68 (!) 122/101 124/75  Pulse: 73  77   Resp: 17  14   Temp:   97.9 F (36.6 C) 97.7 F (36.5 C)  TempSrc:   Oral Oral  SpO2:   94% 97%  Weight:   117.5 kg (259 lb)   Height:   5\' 7"  (1.702 m)     General: Pt is alert, awake, not in acute distress Cardiovascular: RRR, S1/S2 +, no rubs, no gallops Respiratory: CTA bilaterally, no wheezing, no rhonchi Abdominal: Soft, NT, ND, bowel sounds + Extremities: no edema, no cyanosis    The results of significant diagnostics from this hospitalization (including imaging, microbiology, ancillary and laboratory) are listed below for reference.     Microbiology: Recent Results (from the past 240 hour(s))  Blood culture (routine x 2)     Status: Abnormal (Preliminary result)   Collection Time: 03/23/17  1:06 PM  Result Value Ref Range Status   Specimen Description BLOOD BLOOD LEFT FOREARM  Final   Special Requests AEROCOCCUS SPECIES Blood Culture adequate volume (A)  Final   Culture NO GROWTH 2 DAYS  Final   Report Status PENDING  Incomplete  Blood culture (routine x 2)     Status: None (Preliminary result)   Collection Time: 03/23/17  2:00 PM  Result Value Ref Range Status   Specimen Description BLOOD RIGHT HAND  Final   Special Requests   Final    BOTTLES DRAWN AEROBIC AND ANAEROBIC Blood Culture adequate volume   Culture NO GROWTH 2 DAYS  Final   Report Status PENDING  Incomplete  MRSA PCR Screening     Status: None   Collection Time: 03/23/17 11:01 PM  Result Value Ref Range Status   MRSA by PCR NEGATIVE NEGATIVE Final    Comment:        The GeneXpert MRSA Assay (FDA approved for NASAL specimens only), is one component of a comprehensive MRSA colonization surveillance program. It is not intended to diagnose MRSA infection nor to guide  or monitor treatment for MRSA infections.      Labs: BNP (last 3 results)  Recent Labs  11/30/16 1719 02/03/17 1024 03/23/17 1300  BNP 196.7* 375.3* 377.3*   Basic Metabolic Panel:  Recent Labs Lab 03/23/17 1355 03/23/17 1556 03/24/17 0500 03/25/17 0527  NA 142  --  140 141  K 3.4*  --  3.5 3.7  CL 107  --  105 103  CO2 25  --  26 28  GLUCOSE 204*  --  177* 123*  BUN 19  --  25* 31*  CREATININE 1.24  --  1.37* 1.37*  CALCIUM 8.9  --  9.0 9.2  MG  --  1.5* 1.9  --    Liver Function Tests:  Recent Labs Lab 03/23/17 1355 03/24/17 0500  AST 32 29  ALT 23 20  ALKPHOS 50 54  BILITOT 0.6 0.7  PROT 7.2 6.9  ALBUMIN 3.3* 3.2*   No results for input(s): LIPASE, AMYLASE in the last 168 hours. No results for input(s): AMMONIA in the last 168 hours. CBC:  Recent Labs Lab 03/23/17 1300 03/24/17 0500  WBC 11.9* 10.8*  HGB 13.2 11.9*  HCT 39.7 36.2*  MCV 93.4 92.6  PLT 274 263   Cardiac Enzymes: No results for input(s): CKTOTAL, CKMB, CKMBINDEX, TROPONINI in the last 168 hours. BNP: Invalid input(s): POCBNP CBG:  Recent Labs Lab 03/24/17 1110 03/24/17 1616 03/24/17 2110 03/25/17 0722 03/25/17 1113  GLUCAP 252* 149* 162* 111* 145*   D-Dimer No results for input(s): DDIMER in the last 72 hours. Hgb A1c No results for input(s): HGBA1C in the last 72 hours. Lipid Profile No results for input(s): CHOL, HDL, LDLCALC, TRIG, CHOLHDL, LDLDIRECT in the last 72 hours. Thyroid function studies No results for input(s): TSH, T4TOTAL, T3FREE, THYROIDAB in the last 72 hours.  Invalid input(s): FREET3 Anemia work up No results for input(s): VITAMINB12, FOLATE, FERRITIN, TIBC, IRON, RETICCTPCT in the last 72 hours. Urinalysis    Component Value Date/Time   COLORURINE YELLOW 05/20/2016 0301   APPEARANCEUR CLEAR 05/20/2016 0301   LABSPEC 1.012 05/20/2016 0301   PHURINE 5.0 05/20/2016 0301   GLUCOSEU NEGATIVE 05/20/2016 0301   HGBUR TRACE (A) 05/20/2016 0301    BILIRUBINUR NEGATIVE 05/20/2016 0301   KETONESUR NEGATIVE 05/20/2016 0301   PROTEINUR NEGATIVE 05/20/2016 0301   UROBILINOGEN 0.2 05/26/2015 1521   NITRITE POSITIVE (A) 05/20/2016 0301   LEUKOCYTESUR MODERATE (A) 05/20/2016 0301   Sepsis Labs Invalid input(s): PROCALCITONIN,  WBC,  LACTICIDVEN Microbiology Recent Results (from the past 240 hour(s))  Blood culture (routine x 2)     Status: Abnormal (Preliminary result)   Collection Time: 03/23/17  1:06 PM  Result Value Ref Range Status   Specimen Description BLOOD BLOOD LEFT FOREARM  Final   Special Requests AEROCOCCUS SPECIES Blood Culture adequate volume (A)  Final   Culture NO GROWTH 2 DAYS  Final   Report Status PENDING  Incomplete  Blood culture (routine x 2)     Status: None (Preliminary result)   Collection Time: 03/23/17  2:00 PM  Result Value Ref Range Status   Specimen Description BLOOD RIGHT HAND  Final   Special Requests   Final    BOTTLES DRAWN AEROBIC AND ANAEROBIC Blood Culture adequate volume   Culture NO GROWTH 2 DAYS  Final   Report Status PENDING  Incomplete  MRSA PCR Screening     Status: None   Collection Time: 03/23/17 11:01 PM  Result Value Ref Range Status   MRSA by PCR NEGATIVE NEGATIVE Final    Comment:        The GeneXpert MRSA Assay (FDA approved for NASAL specimens only), is one component of a comprehensive MRSA colonization surveillance program. It is not intended to diagnose MRSA infection nor to guide or monitor treatment for MRSA infections.    Time coordinating discharge: 32 minutes  SIGNED:  Standley Dakins, MD  [facility] 03/25/2017, 11:45 AM Pager [phone]  If 7PM-7AM, please contact night-coverage [email or web address] Password [email or web address]

## 2017-03-25 NOTE — Progress Notes (Signed)
Faxed Rx for Lasix to [place] office at [phone]. Spoke w Ms Ryan Macdonald at [facility] [phone] she stated she would get provider to sign off on them today and get them out to patient. Spoke w patient. He states he has 40 mg scored tablets at home. Discussed how his Rx changed from 40 BID to 60 in the am and 40 in the pm. Patient who is a retired LPN verbalized understanding that if he does not get his medications today, he can take 1.5 tablets of his lasix he has at home in the morning, and 1 tablet in the evening. [facility]. However, he states he has never had a problem getting his medication from them same day.

## 2017-03-28 LAB — CULTURE, BLOOD (ROUTINE X 2)
Culture: NO GROWTH
Culture: NO GROWTH
Special Requests: ADEQUATE — AB
Special Requests: ADEQUATE

## 2017-04-05 ENCOUNTER — Ambulatory Visit (INDEPENDENT_AMBULATORY_CARE_PROVIDER_SITE_OTHER): Payer: Non-veteran care | Admitting: Podiatry

## 2017-04-05 ENCOUNTER — Encounter: Payer: Self-pay | Admitting: Podiatry

## 2017-04-05 DIAGNOSIS — E119 Type 2 diabetes mellitus without complications: Secondary | ICD-10-CM

## 2017-04-05 DIAGNOSIS — B351 Tinea unguium: Secondary | ICD-10-CM

## 2017-04-05 DIAGNOSIS — L84 Corns and callosities: Secondary | ICD-10-CM | POA: Diagnosis not present

## 2017-04-05 DIAGNOSIS — E118 Type 2 diabetes mellitus with unspecified complications: Secondary | ICD-10-CM

## 2017-04-05 NOTE — Patient Instructions (Signed)

## 2017-04-05 NOTE — Progress Notes (Signed)
Patient ID: Ryan Macdonald, male   DOB: Oct 03, 1934, 83 y.o.   MRN: [id number]    This patient presents today for follow-up visit of 11/28/2015 complaining of nose and cough walking wearing shoes and a painful corn on the fifth right toe and requests treatment for both these areas. Patient was to return for follow-up visit of 11/28/2015, however, does not present today  Objective: DP and PT pulses 2/4 bilaterally Capillary reflex immediate bilaterally Vibratory sensation intact bilaterally Sensation to 10 g monofilament wire intact 5/5 bilaterally Ankle reflex equal and reactive bilaterally No openskin lesions bilaterally Keratoses lateral fifth right toe The toenails are elongated, brittle, deformed 6-10 HAV left  Assessment: Diabetic with satisfactory neurovascular status Symptomatic onychomycoses 6-10 Keratoses 1  Plan: Debrided toenails 6-10 mechanically and legs without anybleeding Debrided keratoses 1without anybleeding   Reappoint 3 months

## 2017-04-14 ENCOUNTER — Inpatient Hospital Stay (HOSPITAL_COMMUNITY)
Admission: EM | Admit: 2017-04-14 | Discharge: 2017-04-15 | DRG: 291 | Disposition: A | Discharge status: Home / Self Care | Payer: Medicare Other | Attending: Internal Medicine | Admitting: Internal Medicine

## 2017-04-14 ENCOUNTER — Encounter (HOSPITAL_COMMUNITY): Payer: Self-pay | Admitting: Emergency Medicine

## 2017-04-14 ENCOUNTER — Emergency Department (HOSPITAL_COMMUNITY): Payer: Medicare Other

## 2017-04-14 DIAGNOSIS — I2699 Other pulmonary embolism without acute cor pulmonale: Secondary | ICD-10-CM | POA: Diagnosis present

## 2017-04-14 DIAGNOSIS — R0902 Hypoxemia: Secondary | ICD-10-CM | POA: Diagnosis not present

## 2017-04-14 DIAGNOSIS — J9600 Acute respiratory failure, unspecified whether with hypoxia or hypercapnia: Secondary | ICD-10-CM | POA: Diagnosis present

## 2017-04-14 DIAGNOSIS — N183 Chronic kidney disease, stage 3 unspecified: Secondary | ICD-10-CM | POA: Diagnosis present

## 2017-04-14 DIAGNOSIS — K7460 Unspecified cirrhosis of liver: Secondary | ICD-10-CM | POA: Diagnosis present

## 2017-04-14 DIAGNOSIS — Z95810 Presence of automatic (implantable) cardiac defibrillator: Secondary | ICD-10-CM | POA: Diagnosis not present

## 2017-04-14 DIAGNOSIS — I4891 Unspecified atrial fibrillation: Secondary | ICD-10-CM | POA: Diagnosis present

## 2017-04-14 DIAGNOSIS — M109 Gout, unspecified: Secondary | ICD-10-CM | POA: Diagnosis present

## 2017-04-14 DIAGNOSIS — J9601 Acute respiratory failure with hypoxia: Secondary | ICD-10-CM | POA: Diagnosis not present

## 2017-04-14 DIAGNOSIS — Z86711 Personal history of pulmonary embolism: Secondary | ICD-10-CM

## 2017-04-14 DIAGNOSIS — E118 Type 2 diabetes mellitus with unspecified complications: Secondary | ICD-10-CM | POA: Diagnosis not present

## 2017-04-14 DIAGNOSIS — E785 Hyperlipidemia, unspecified: Secondary | ICD-10-CM | POA: Diagnosis present

## 2017-04-14 DIAGNOSIS — N400 Enlarged prostate without lower urinary tract symptoms: Secondary | ICD-10-CM | POA: Diagnosis present

## 2017-04-14 DIAGNOSIS — F329 Major depressive disorder, single episode, unspecified: Secondary | ICD-10-CM | POA: Diagnosis present

## 2017-04-14 DIAGNOSIS — I130 Hypertensive heart and chronic kidney disease with heart failure and stage 1 through stage 4 chronic kidney disease, or unspecified chronic kidney disease: Principal | ICD-10-CM | POA: Diagnosis present

## 2017-04-14 DIAGNOSIS — R7989 Other specified abnormal findings of blood chemistry: Secondary | ICD-10-CM | POA: Diagnosis present

## 2017-04-14 DIAGNOSIS — I5043 Acute on chronic combined systolic (congestive) and diastolic (congestive) heart failure: Secondary | ICD-10-CM | POA: Diagnosis present

## 2017-04-14 DIAGNOSIS — E669 Obesity, unspecified: Secondary | ICD-10-CM | POA: Diagnosis present

## 2017-04-14 DIAGNOSIS — I480 Paroxysmal atrial fibrillation: Secondary | ICD-10-CM | POA: Diagnosis present

## 2017-04-14 DIAGNOSIS — R748 Abnormal levels of other serum enzymes: Secondary | ICD-10-CM | POA: Diagnosis not present

## 2017-04-14 DIAGNOSIS — I482 Chronic atrial fibrillation: Secondary | ICD-10-CM | POA: Diagnosis not present

## 2017-04-14 DIAGNOSIS — N2889 Other specified disorders of kidney and ureter: Secondary | ICD-10-CM | POA: Diagnosis present

## 2017-04-14 DIAGNOSIS — Z7901 Long term (current) use of anticoagulants: Secondary | ICD-10-CM

## 2017-04-14 DIAGNOSIS — F32A Depression, unspecified: Secondary | ICD-10-CM | POA: Diagnosis present

## 2017-04-14 DIAGNOSIS — R778 Other specified abnormalities of plasma proteins: Secondary | ICD-10-CM | POA: Diagnosis present

## 2017-04-14 DIAGNOSIS — I10 Essential (primary) hypertension: Secondary | ICD-10-CM

## 2017-04-14 DIAGNOSIS — I509 Heart failure, unspecified: Secondary | ICD-10-CM

## 2017-04-14 LAB — CBC WITH DIFFERENTIAL/PLATELET
BASOS ABS: 0 10*3/uL (ref 0.0–0.1)
Basophils Relative: 0 %
EOS PCT: 2 %
Eosinophils Absolute: 0.1 10*3/uL (ref 0.0–0.7)
HCT: 34.7 % — ABNORMAL LOW (ref 39.0–52.0)
Hemoglobin: 11.1 g/dL — ABNORMAL LOW (ref 13.0–17.0)
LYMPHS PCT: 18 %
Lymphs Abs: 1.6 10*3/uL (ref 0.7–4.0)
MCH: 30.2 pg (ref 26.0–34.0)
MCHC: 32 g/dL (ref 30.0–36.0)
MCV: 94.3 fL (ref 78.0–100.0)
MONO ABS: 0.7 10*3/uL (ref 0.1–1.0)
MONOS PCT: 7 %
Neutro Abs: 6.6 10*3/uL (ref 1.7–7.7)
Neutrophils Relative %: 73 %
PLATELETS: 196 10*3/uL (ref 150–400)
RBC: 3.68 MIL/uL — ABNORMAL LOW (ref 4.22–5.81)
RDW: 14.2 % (ref 11.5–15.5)
WBC: 8.9 10*3/uL (ref 4.0–10.5)

## 2017-04-14 LAB — COMPREHENSIVE METABOLIC PANEL
ALK PHOS: 48 U/L (ref 38–126)
ALT: 18 U/L (ref 17–63)
AST: 26 U/L (ref 15–41)
Albumin: 3.2 g/dL — ABNORMAL LOW (ref 3.5–5.0)
Anion gap: 12 (ref 5–15)
BUN: 22 mg/dL — ABNORMAL HIGH (ref 6–20)
CALCIUM: 8.6 mg/dL — AB (ref 8.9–10.3)
CO2: 24 mmol/L (ref 22–32)
CREATININE: 1.28 mg/dL — AB (ref 0.61–1.24)
Chloride: 105 mmol/L (ref 101–111)
GFR, EST AFRICAN AMERICAN: 59 mL/min — AB (ref >60)
GFR, EST NON AFRICAN AMERICAN: 51 mL/min — AB (ref >60)
Glucose, Bld: 131 mg/dL — ABNORMAL HIGH (ref 65–99)
Potassium: 3.7 mmol/L (ref 3.5–5.1)
Sodium: 141 mmol/L (ref 135–145)
Total Bilirubin: 1 mg/dL (ref 0.3–1.2)
Total Protein: 6.7 g/dL (ref 6.5–8.1)

## 2017-04-14 LAB — TROPONIN I
TROPONIN I: 0.05 ng/mL — AB (ref <0.03)
TROPONIN I: 0.05 ng/mL — AB (ref <0.03)
Troponin I: 0.06 ng/mL (ref <0.03)
Troponin I: 0.05 ng/mL (ref <0.03)
Troponin I: 0.05 ng/mL (ref <0.03)

## 2017-04-14 LAB — GLUCOSE, CAPILLARY
GLUCOSE-CAPILLARY: 130 mg/dL — AB (ref 65–99)
GLUCOSE-CAPILLARY: 113 mg/dL — AB (ref 65–99)
GLUCOSE-CAPILLARY: 123 mg/dL — AB (ref 65–99)

## 2017-04-14 LAB — BRAIN NATRIURETIC PEPTIDE: B Natriuretic Peptide: 243 pg/mL — ABNORMAL HIGH (ref 0.0–100.0)

## 2017-04-14 MED ORDER — FUROSEMIDE 10 MG/ML IJ SOLN
60.0000 mg | Freq: Once | INTRAMUSCULAR | Status: AC
  Administered 2017-04-14: 60 mg via INTRAVENOUS
  Filled 2017-04-14: qty 6

## 2017-04-14 MED ORDER — SODIUM CHLORIDE 0.9 % IV SOLN: 250.0000 mL | INTRAVENOUS | Status: DC | PRN

## 2017-04-14 MED ORDER — SODIUM CHLORIDE 0.9% FLUSH
3.0000 mL | Freq: Two times a day (BID) | INTRAVENOUS | Status: DC
  Administered 2017-04-14 (×2): 3 mL via INTRAVENOUS

## 2017-04-14 MED ORDER — TAMSULOSIN HCL 0.4 MG PO CAPS
0.4000 mg | ORAL_CAPSULE | Freq: Every day | ORAL | Status: DC
  Administered 2017-04-14: 0.4 mg via ORAL
  Filled 2017-04-14: qty 1

## 2017-04-14 MED ORDER — DIPHENHYDRAMINE HCL 25 MG PO CAPS: 25.0000 mg | ORAL_CAPSULE | Freq: Every evening | ORAL | Status: DC | PRN

## 2017-04-14 MED ORDER — ONDANSETRON HCL 4 MG/2ML IJ SOLN: 4.0000 mg | Freq: Four times a day (QID) | INTRAMUSCULAR | Status: DC | PRN

## 2017-04-14 MED ORDER — VITAMIN B-12 1000 MCG PO TABS
1000.0000 ug | ORAL_TABLET | Freq: Every day | ORAL | Status: DC
Start: 2017-04-14 — End: 2017-04-15
  Administered 2017-04-14 – 2017-04-15 (×2): 1000 ug via ORAL
  Filled 2017-04-14 (×2): qty 1

## 2017-04-14 MED ORDER — CARVEDILOL 12.5 MG PO TABS: 12.5000 mg | ORAL_TABLET | Freq: Two times a day (BID) | ORAL | Status: DC

## 2017-04-14 MED ORDER — FUROSEMIDE 10 MG/ML IJ SOLN
60.0000 mg | Freq: Two times a day (BID) | INTRAMUSCULAR | Status: DC
  Administered 2017-04-14 – 2017-04-15 (×2): 60 mg via INTRAVENOUS
  Filled 2017-04-14 (×2): qty 6

## 2017-04-14 MED ORDER — SPIRONOLACTONE 25 MG PO TABS
25.0000 mg | ORAL_TABLET | Freq: Every day | ORAL | Status: DC
  Administered 2017-04-14 – 2017-04-15 (×2): 25 mg via ORAL
  Filled 2017-04-14 (×2): qty 1

## 2017-04-14 MED ORDER — FINASTERIDE 5 MG PO TABS
5.0000 mg | ORAL_TABLET | Freq: Every day | ORAL | Status: DC
  Administered 2017-04-14 – 2017-04-15 (×2): 5 mg via ORAL
  Filled 2017-04-14 (×2): qty 1

## 2017-04-14 MED ORDER — INSULIN ASPART 100 UNIT/ML ~~LOC~~ SOLN
0.0000 [IU] | Freq: Three times a day (TID) | SUBCUTANEOUS | Status: DC
  Administered 2017-04-14 (×2): 1 [IU] via SUBCUTANEOUS
  Administered 2017-04-15: 2 [IU] via SUBCUTANEOUS

## 2017-04-14 MED ORDER — APIXABAN 5 MG PO TABS
5.0000 mg | ORAL_TABLET | Freq: Two times a day (BID) | ORAL | Status: DC
Start: 2017-04-14 — End: 2017-04-15
  Administered 2017-04-14 – 2017-04-15 (×3): 5 mg via ORAL
  Filled 2017-04-14 (×3): qty 1

## 2017-04-14 MED ORDER — INSULIN ASPART 100 UNIT/ML ~~LOC~~ SOLN: 0.0000 [IU] | Freq: Every day | SUBCUTANEOUS | Status: DC

## 2017-04-14 MED ORDER — ALLOPURINOL 100 MG PO TABS
200.0000 mg | ORAL_TABLET | Freq: Every day | ORAL | Status: DC
  Administered 2017-04-14 – 2017-04-15 (×2): 200 mg via ORAL
  Filled 2017-04-14 (×2): qty 2

## 2017-04-14 MED ORDER — HYDROCODONE-ACETAMINOPHEN 5-325 MG PO TABS
1.0000 | ORAL_TABLET | Freq: Two times a day (BID) | ORAL | Status: DC | PRN
  Administered 2017-04-14: 1 via ORAL
  Filled 2017-04-14: qty 1

## 2017-04-14 MED ORDER — CARVEDILOL 12.5 MG PO TABS
6.2500 mg | ORAL_TABLET | Freq: Two times a day (BID) | ORAL | Status: DC
  Administered 2017-04-14 – 2017-04-15 (×4): 6.25 mg via ORAL
  Filled 2017-04-14 (×4): qty 1

## 2017-04-14 MED ORDER — ATORVASTATIN CALCIUM 40 MG PO TABS
40.0000 mg | ORAL_TABLET | Freq: Every day | ORAL | Status: DC
  Administered 2017-04-14 – 2017-04-15 (×2): 40 mg via ORAL
  Filled 2017-04-14 (×2): qty 1

## 2017-04-14 MED ORDER — ACETAMINOPHEN 325 MG PO TABS: 650.0000 mg | ORAL_TABLET | ORAL | Status: DC | PRN

## 2017-04-14 MED ORDER — KETOCONAZOLE 2 % EX SHAM
1.0000 "application " | MEDICATED_SHAMPOO | CUTANEOUS | Status: DC
  Filled 2017-04-14: qty 120

## 2017-04-14 MED ORDER — GUAIFENESIN 100 MG/5ML PO SYRP
100.0000 mg | ORAL_SOLUTION | Freq: Three times a day (TID) | ORAL | Status: DC | PRN
  Filled 2017-04-14: qty 5

## 2017-04-14 MED ORDER — SODIUM CHLORIDE 0.9% FLUSH: 3.0000 mL | INTRAVENOUS | Status: DC | PRN

## 2017-04-14 MED ORDER — ZOLPIDEM TARTRATE 5 MG PO TABS: 5.0000 mg | ORAL_TABLET | Freq: Every evening | ORAL | Status: DC | PRN

## 2017-04-14 MED ORDER — HYDROCODONE-ACETAMINOPHEN 5-325 MG PO TABS
1.0000 | ORAL_TABLET | Freq: Once | ORAL | Status: AC
  Administered 2017-04-14: 1 via ORAL
  Filled 2017-04-14: qty 1

## 2017-04-14 MED ORDER — SERTRALINE HCL 50 MG PO TABS: 50.0000 mg | ORAL_TABLET | Freq: Every day | ORAL | Status: DC | PRN

## 2017-04-14 MED ORDER — ISOSORBIDE DINITRATE ER 40 MG PO CPCR
40.0000 mg | ORAL_CAPSULE | Freq: Three times a day (TID) | ORAL | Status: DC
  Administered 2017-04-14 – 2017-04-15 (×5): 40 mg via ORAL
  Filled 2017-04-14 (×6): qty 1

## 2017-04-14 MED ORDER — ALBUTEROL SULFATE (2.5 MG/3ML) 0.083% IN NEBU: 2.5000 mg | INHALATION_SOLUTION | RESPIRATORY_TRACT | Status: DC | PRN

## 2017-04-14 MED ORDER — HYDRALAZINE HCL 25 MG PO TABS
25.0000 mg | ORAL_TABLET | Freq: Three times a day (TID) | ORAL | Status: DC
  Administered 2017-04-14 – 2017-04-15 (×4): 25 mg via ORAL
  Filled 2017-04-14 (×5): qty 1

## 2017-04-14 NOTE — Progress Notes (Signed)
Patient admitted earlier this morning. H&P reviewed. Patient seen and examined.  Patient is feeling much better after he has received Lasix. Denies any chest pain, nausea, vomiting. Denies noncompliance with medications. Denies noncompliance with diet.  Vital signs reviewed. Only a few crackles identified at the bases. Mostly clear to auscultation. S1, S2 normal, regular. Systolic murmur appreciated over the precordium. No S3, S4. Pedal edema noted. Abdomen soft, nontender, nondistended. Bowel sounds are present. No masses or organomegaly. He is awake, alert. Oriented 3. No focal neurologic deficits.  EKG reviewed and no changes compared to previous EKG. Troponin noted to be mildly elevated, but has plateaued.  This is a 83 year old African-American male with a past medical history of systolic congestive heart failure, status post ICD placement with ejection fraction of 15-20% based echocardiogram done in March of this year. This is his third hospitalization in the last 3 months. Patient was seen by cardiology when he was last hospitalized in April. Patient is followed by cardiology at the [place] in [place]. He was seen by their office on Tuesday. Patient has been compliant with his medications. Considering that he has improved after he was given IV Lasix, his presentation is most likely due to CHF. He is noted to be anticoagulated hence a venous thromboembolism is less likely. Continue IV Lasix for today. Renal function is stable. He's on beta blocker. Not on ACE inhibitor or ARB due to history of angioedema. Elevated troponin, most likely due to CHF. Denies any chest pain. No further workup anticipated. Considering rapid improvement, it is possible he might be ready for discharge tomorrow.  Ryan Macdonald 04/14/2017

## 2017-04-14 NOTE — Discharge Instructions (Signed)

## 2017-04-14 NOTE — Progress Notes (Signed)
Patient arrived to the unit via stretcher. Patient able to transfer from stretcher to bedside unassisted. Patient alert and oriented with no acute distress noted. Patient's plan of care and orders reviewed at bedside. Patient provide heart failure booklet and teaching reinforced.

## 2017-04-14 NOTE — ED Triage Notes (Signed)
Per EMS, pt from home with shortness of breath x 2 hours. Hx of CHF, pt on furosemide, states he has not missed any doses. Wheezing noted in bilateral upper lobes, 10 mg albuterol, and 0.5 mg atrovent given PTA. EMS vitals: BP-150/80, P-72, RR-18, SpO2-95% room air, CBG_145

## 2017-04-14 NOTE — Progress Notes (Signed)
Patient had 9 Beats of vtach, patient asymptomatic, has pacemaker. Will continue to monitor.

## 2017-04-14 NOTE — Progress Notes (Signed)
Mr Ryan Macdonald admitted to room 3E12 from ED via stretcher transport.  Patient oriented to room and equipment and call bell within reach.  Patient updated on orders and plan of care by [facility], RN who welcomed patient to room.  Patient has no voiced complaints.

## 2017-04-14 NOTE — Care Management Note (Addendum)
Case Management Note  Patient Details  Name: Ryan Macdonald MRN: [id number] Date of Birth: 20-Oct-1934  Subjective/Objective:    Admitted with  Acute Respiratory Failure with Hypoxia               Action/Plan: Patient recently discharged home; CM will continue to follow for DCP. Ryan Macdonald with [facility] and [facility] with the [place] [phone];Ryan Derrick RN,MHA,BSN  03/24/2017 - Pt presented for Acute Respiratory Failure. Initiated on BIPAP, IV solumedrol and IV Lasix. Pt is from home with the support of wife. Wife at bedside during the time of conversation. Pt has PCP Dr. Cleon Macdonald at the [facility]. Fax number to office is [phone]. Pt does not use a local pharmacy-gets all medications from the [place]. CM did make Ryan Macdonald [phone] Ext [phone]  transfer coordinator aware that pt is hospitalized. Voicemail left. Pt has DME cane and Rollator at home. Ryan Payment RN CM  Expected Discharge Date:    possibly 04/17/2017             Expected Discharge Plan:  Home/Self Care  Discharge planning Services  CM Consult    Status of Service:  In process, will continue to follow  Ryan Macdonald [phone] 04/14/2017, 10:59 AM

## 2017-04-14 NOTE — ED Provider Notes (Signed)
[facility]-EMERGENCY DEPT Provider Note   CSN: [id number] Arrival date & time: 04/14/17  0149  By signing my name below, I, Ryan Macdonald, attest that this documentation has been prepared under the direction and in the presence of [name], MD. Electronically Signed: Karren Macdonald, ED Scribe. 04/14/17. 3:38 AM.  Time seen 01:59 AM  History   Chief Complaint Chief Complaint  Patient presents with  . Shortness of Breath   The history is provided by the patient and the EMS personnel. No language interpreter was used.    HPI Comments: Ryan Macdonald is a 83 y.o. male  with a PMHx of CHF, DMII, HLD, HTN, and PAF, brought in by ambulance, who presents to the Emergency Department complaining of sudden onset, gradually worseneing of shortness of breath tthat started tonight at 12:22AM. Pt notes having a cough with no phlegm production. He states he was unable to sleep when this episode occurred. EMS was called and pt was transported to the ED. At this time pt notes his breathing has improved with the breathing treatment that was administered by EMS. Denies wheezing, chest pain, or bilaterally lower extremity edema.  He states he's had this before with a flareup of his congestive heart failure.  PCP Center, [facility]   Past Medical History:  Diagnosis Date  . Cat scratch fever    removed mass on right side neck  . Cataracts, bilateral   . CHF (congestive heart failure) (HCC)   . Diabetes mellitus without complication (HCC)    TYPE 2  . High cholesterol   . Hypertension   . Kidney stones   . Morbid obesity (HCC)   . Nonischemic cardiomyopathy (HCC)    a. ? 2009 Cath in [place] - nl cors per pt;  b. 09/2013 Echo: EF 25-30%, sev diff HK.  . Osteoarthritis   . PAF (paroxysmal atrial fibrillation) (HCC)    a. post-op hip in 2014 - prev on xarelto.  . Shortness of breath dyspnea     Patient Active Problem List   Diagnosis Date Noted  . Lactic acidosis   . Acute on chronic systolic heart  failure, NYHA class 2 (HCC) 02/03/2017  . Diabetes mellitus type 2 in obese (HCC) 02/03/2017  . CAP (community acquired pneumonia) 02/03/2017  . Lobar pneumonia (HCC)   . Diabetes mellitus with complication (HCC)   . Acute on chronic respiratory failure with hypoxia (HCC)   . Acute on chronic combined systolic and diastolic CHF (congestive heart failure) (HCC) 05/19/2016  . Depression 05/19/2016  . Gout 05/19/2016  . 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 NSVT (nonsustained ventricular tachycardia) (HCC)   . PE (pulmonary embolism)   . Acute respiratory failure with hypoxia (HCC)   . Osteoarthritis of left hip 10/25/2013  . Atrial fibrillation (HCC) 10/25/2013  . S/P hip replacement 10/24/2013  . Nonischemic cardiomyopathy (HCC)   . Obesity (BMI 30-39.9)   . Essential hypertension   . Hyperlipidemia     Past Surgical History:  Procedure Laterality Date  . CARDIAC CATHETERIZATION  1980's  . CIRCUMCISION  1940's  . CYSTOSCOPY WITH URETHRAL DILATATION  10/23/2013  . CYSTOSCOPY WITH URETHRAL DILATATION N/A 10/23/2013   Procedure: CYSTOSCOPY WITH URETHRAL DILATATION;  Surgeon: Kathi Ludwig, MD;  Location: [facility] OR;  Service: Urology;  Laterality: N/A;  . INCISION AND DRAINAGE OF WOUND Right 1980's   "cat scratch" (10/23/2013)  . INGUINAL HERNIA REPAIR Right 1950's  . KIDNEY STONE SURGERY Right 1970's; 1983   "  twice"  . TONSILLECTOMY AND ADENOIDECTOMY  1940's  . TOTAL HIP ARTHROPLASTY Left 10/23/2013  . TOTAL HIP ARTHROPLASTY Left 10/23/2013   Procedure: TOTAL HIP ARTHROPLASTY;  Surgeon: Valeria Batman, MD;  Location: [facility] OR;  Service: Orthopedics;  Laterality: Left;       Home Medications    Prior to Admission medications   Medication Sig Start Date End Date Taking? Authorizing Provider  allopurinol (ZYLOPRIM) 100 MG tablet Take 200 mg by mouth daily.   Yes [provider]  apixaban (ELIQUIS) 5 MG TABS tablet Take 1 tablet (5 mg  total) by mouth 2 (two) times daily. 02/06/17  Yes Amin, Ankit Chirag, MD  carvedilol (COREG) 12.5 MG tablet Take 12.5 mg by mouth 2 (two) times daily with a meal.   Yes [provider]  cyanocobalamin 1000 MCG tablet Take 1,000 mcg by mouth daily.    Yes [provider]  diphenhydrAMINE (BENADRYL) 25 mg capsule Take 25 mg by mouth at bedtime as needed for sleep.   Yes [provider]  finasteride (PROSCAR) 5 MG tablet Take 5 mg by mouth daily.   Yes [provider]  furosemide (LASIX) 20 MG tablet Take 3 tabs every morning and take 2 tabs every evening Patient taking differently: Take 40-60 mg by mouth See admin instructions. Take 3 tabs every morning and take 2 tabs every evening 03/25/17  Yes Johnson, Clanford L, MD  glipiZIDE (GLUCOTROL) 5 MG tablet Take 5 mg by mouth daily before breakfast.   Yes [provider]  guaifenesin (ROBITUSSIN) 100 MG/5ML syrup Take 100 mg by mouth 3 (three) times daily as needed for cough.   Yes [provider]  hydrALAZINE (APRESOLINE) 25 MG tablet Take 25 mg by mouth 3 (three) times daily.   Yes [provider]  HYDROcodone-acetaminophen (NORCO/VICODIN) 5-325 MG tablet Take 2 tablets by mouth every 4 (four) hours as needed. Patient taking differently: Take 1 tablet by mouth 2 (two) times daily as needed (for back pain).  04/09/16  Yes Isa Rankin, MD  isosorbide dinitrate (DILATRATE-SR) 40 MG CR capsule Take 40 mg by mouth 3 (three) times daily.   Yes [provider]  ketoconazole (NIZORAL) 2 % shampoo Apply 1 application topically 2 (two) times a week.   Yes [provider]  sertraline (ZOLOFT) 25 MG tablet Take 50 mg by mouth daily as needed (for anxiety).    Yes [provider]  simvastatin (ZOCOR) 80 MG tablet Take 40 mg by mouth daily.   Yes [provider]  spironolactone (ALDACTONE) 25 MG tablet Take 25 mg by mouth daily.   Yes [provider]    tamsulosin (FLOMAX) 0.4 MG CAPS capsule Take 0.4 mg by mouth at bedtime.   Yes [provider]    Family History Family History  Problem Relation Age of Onset  . Heart Problems Maternal Grandmother   . Other Unknown        negative for premature CAD    Social History Social History  Substance Use Topics  . Smoking status: Former Smoker    Packs/day: 1.00    Years: 15.00    Types: Cigarettes, Cigars  . Smokeless tobacco: Former Neurosurgeon     Comment: quit smoking ~ 40 yr ago  . Alcohol use No     Comment: rare beer.  lives at home Lives with spouse   Allergies   Lisinopril   Review of Systems Review of Systems  Respiratory: Positive for cough  and shortness of breath. Negative for wheezing.   Cardiovascular: Negative for leg swelling.  All other systems reviewed and are negative.    Physical Exam Updated Vital Signs Ht 5\' 7"  (1.702 m)   Wt 254 lb (115.2 kg)   BMI 39.78 kg/m   Physical Exam  Constitutional: He is oriented to person, place, and time. He appears well-developed and well-nourished.  Non-toxic appearance. He does not appear ill. No distress.  HENT:  Head: Normocephalic and atraumatic.  Right Ear: External ear normal.  Left Ear: External ear normal.  Nose: Nose normal. No mucosal edema or rhinorrhea.  Mouth/Throat: Oropharynx is clear and moist and mucous membranes are normal. No dental abscesses or uvula swelling.  Eyes: Conjunctivae and EOM are normal. Pupils are equal, round, and reactive to light.  Neck: Normal range of motion and full passive range of motion without pain. Neck supple.  Cardiovascular: Normal rate, regular rhythm and normal heart sounds.  Exam reveals no gallop and no friction rub.   No murmur heard. 1+ edema up to the knees to the BLE.   Pulmonary/Chest: Effort normal. No respiratory distress. He has wheezes. He has no rhonchi. He has no rales. He exhibits no tenderness and no crepitus.  Abdominal: Soft. Normal appearance  and bowel sounds are normal. He exhibits no distension. There is no tenderness. There is no rebound and no guarding.  Musculoskeletal: Normal range of motion. He exhibits edema. He exhibits no tenderness.  Moves all extremities well. 1+ edema up to the knees to the BLE.   Neurological: He is alert and oriented to person, place, and time. He has normal strength. No cranial nerve deficit.  Skin: Skin is warm, dry and intact. No rash noted. No erythema. No pallor.  Psychiatric: He has a normal mood and affect. His speech is normal and behavior is normal. His mood appears not anxious.  Nursing note and vitals reviewed.   ED Treatments / Results  Labs (all labs ordered are listed, but only abnormal results are displayed)  Results for orders placed or performed during the hospital encounter of 04/14/17  Comprehensive metabolic panel  Result Value Ref Range   Sodium 141 135 - 145 mmol/L   Potassium 3.7 3.5 - 5.1 mmol/L   Chloride 105 101 - 111 mmol/L   CO2 24 22 - 32 mmol/L   Glucose, Bld 131 (H) 65 - 99 mg/dL   BUN 22 (H) 6 - 20 mg/dL   Creatinine, Ser 1.61 (H) 0.61 - 1.24 mg/dL   Calcium 8.6 (L) 8.9 - 10.3 mg/dL   Total Protein 6.7 6.5 - 8.1 g/dL   Albumin 3.2 (L) 3.5 - 5.0 g/dL   AST 26 15 - 41 U/L   ALT 18 17 - 63 U/L   Alkaline Phosphatase 48 38 - 126 U/L   Total Bilirubin 1.0 0.3 - 1.2 mg/dL   GFR calc non Af Amer 51 (L) >60 mL/min   GFR calc Af Amer 59 (L) >60 mL/min   Anion gap 12 5 - 15  Brain natriuretic peptide  Result Value Ref Range   B Natriuretic Peptide 243.0 (H) 0.0 - 100.0 pg/mL  Troponin I  Result Value Ref Range   Troponin I 0.05 (HH) <0.03 ng/mL  CBC with Differential  Result Value Ref Range   WBC 8.9 4.0 - 10.5 K/uL   RBC 3.68 (L) 4.22 - 5.81 MIL/uL   Hemoglobin 11.1 (L) 13.0 - 17.0 g/dL   HCT 09.6 (L) 04.5 - 40.9 %  MCV 94.3 78.0 - 100.0 fL   MCH 30.2 26.0 - 34.0 pg   MCHC 32.0 30.0 - 36.0 g/dL   RDW 83.6 62.9 - 47.6 %   Platelets 196 150 - 400 K/uL     Neutrophils Relative % 73 %   Neutro Abs 6.6 1.7 - 7.7 K/uL   Lymphocytes Relative 18 %   Lymphs Abs 1.6 0.7 - 4.0 K/uL   Monocytes Relative 7 %   Monocytes Absolute 0.7 0.1 - 1.0 K/uL   Eosinophils Relative 2 %   Eosinophils Absolute 0.1 0.0 - 0.7 K/uL   Basophils Relative 0 %   Basophils Absolute 0.0 0.0 - 0.1 K/uL  Troponin I  Result Value Ref Range   Troponin I 0.06 (HH) <0.03 ng/mL   Laboratory interpretation all normal except Mildly positive troponin however the delta troponin is unchanged, renal insufficiency, hyperglycemia, Mild elevation of BNP, stable anemia   EKG  EKG Interpretation  Date/Time:  Thursday Apr 14 2017 01:54:59 EDT Ventricular Rate:  73 PR Interval:    QRS Duration: 99 QT Interval:  456 QTC Calculation: 503 R Axis:   48 Text Interpretation:  Sinus rhythm Prolonged PR interval Low voltage, precordial leads Nonspecific T abnormalities, lateral leads Prolonged QT interval Since last tracing rate slower 23 Mar 2017 Confirmed by Balthazar Dooly  MD-I, Amiliah Campisi ([id number]) on 04/14/2017 3:36:10 AM       Radiology Dg Chest Port 1 View  Result Date: 04/14/2017 CLINICAL DATA:  Shortness of breath EXAM: PORTABLE CHEST 1 VIEW COMPARISON:  None. FINDINGS: Cardiomediastinal silhouette is enlarged. There is a left chest wall single lead AICD with tip overlying the left ventricle. There is pulmonary vascular congestion without overt edema. No focal airspace consolidation. No pneumothorax or pleural effusion. IMPRESSION: Cardiomegaly and pulmonary vascular congestion without overt pulmonary edema. Electronically Signed   By: Deatra Robinson M.D.   On: 04/14/2017 02:15    Procedures Procedures (including critical care time)  Medications Ordered in ED Medications  furosemide (LASIX) injection 60 mg (60 mg Intravenous Given 04/14/17 0228)  HYDROcodone-acetaminophen (NORCO/VICODIN) 5-325 MG per tablet 1 tablet (1 tablet Oral Given 04/14/17 0228)     Initial Impression / Assessment and  Plan / ED Course  I have reviewed the triage vital signs and the nursing notes.  Pertinent labs & imaging results that were available during my care of the patient were reviewed by me and considered in my medical decision making (see chart for details).     DIAGNOSTIC STUDIES: Oxygen Saturation is 97% on RA, normal by my interpretation.   COORDINATION OF CARE: 2:04 AM-Discussed next steps with pt. Pt verbalized understanding and is agreeable with the plan. Chest x-ray laboratory testing was ordered. Patient was given Lasix IV.  4:14 AM  Pt notes he is feeling tremendously better at this time. His lung exam is now clear. He reports good urinary output. I am going to have nursing staff emulate patient and see how he does with ambulation. Also his troponin was mildly positive which in retrospect he's had before. A delta troponin was ordered.  Stark Bray, EMT  04/14/2017 04:56    Pt ambulated well pt saturations started out at 91% and decreased the further he walked to 82% no complaints of SOB while ambulating no other complaint noted at this time       5:35 AM I talked to patient about his ambulation results. He said he felt fine got ambulating however we discussed that his oxygen becomes  very low. His delta troponin has returned and is basically the same so is probably his baseline elevation due to his congestive heart failure. We discussed today he becomes very hypoxic with ambulation. At this point it feel like he should be admitted to the hospital and get some more of the fluid diuresed out of his body and he may need home oxygen.  We discussed admission and he is agreeable.  05:57 AM Dr Clyde Lundborg, hospitalist will admit.    Final Clinical Impressions(s) / ED Diagnoses   Final diagnoses:  Acute on chronic combined systolic and diastolic congestive heart failure (HCC)  Hypoxia    Plan admission  [name], MD, FACEP   I personally performed the services described in this  documentation, which was scribed in my presence. The recorded information has been reviewed and considered.  [name], MD, Concha Pyo, MD 04/14/17 [phone]

## 2017-04-14 NOTE — H&P (Signed)
History and Physical    Ryan Macdonald ZOX:[id number] DOB: [birth year] DOA: 04/14/2017  Referring MD/NP/PA:   PCP: [facility]   Patient coming from:  The patient is coming from home.  At baseline, pt is independent for most of ADL.  Chief Complaint: SOB  HPI: Ryan Macdonald is a 83 y.o. 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  Patient states that she started having shortness of breath since last night, which has been progressively getting worse. He denies chest pain, cough, fever or chills. He is currently taking Lasix 60 mg in the morning and 40 mg in the evening. He states that his body weight has been stable. No significant leg edema. He speaks in full sentence. Patient does not have nausea, vomiting, diarrhea or abdominal pain. No symptoms of UTI or unilateral weakness.  ED Course: pt was found to have troponin 0.05, 0.06, BNP 243, stable renal function, temperature normal, oxygen saturation 93% at rest, but decreased to 83% on ambulation. Chest x-ray showed vascular congestion. Patient is admitted to telemetry bed as inpatient.  Review of Systems:   General: no fevers, chills, no changes in body weight, has fatigue HEENT: no blurry vision, hearing changes or sore throat Respiratory: has dyspnea, no coughing, wheezing CV: no chest pain, no palpitations GI: no nausea, vomiting, abdominal pain, diarrhea, constipation GU: no dysuria, burning on urination, increased urinary frequency, hematuria  Ext: mild leg edema Neuro: no unilateral weakness, numbness, or tingling, no vision change or hearing loss Skin: no rash, no skin tear. MSK: No muscle spasm, no deformity, no limitation of range of movement in spin Heme: No easy bruising.  Travel history:  No recent long distant travel.  Allergy:  Allergies  Allergen Reactions  . Lisinopril Swelling    Past Medical History:  Diagnosis Date  . Cat scratch fever    removed mass on right side neck  . Cataracts, bilateral   . CHF (congestive heart failure) (HCC)   . Diabetes mellitus without complication (HCC)    TYPE 2  . High cholesterol   . Hypertension   . Kidney stones   . Morbid obesity (HCC)   . Nonischemic cardiomyopathy (HCC)    a. ? 2009 Cath in [place] - nl cors per pt;  b. 09/2013 Echo: EF 25-30%, sev diff HK.  . Osteoarthritis   . PAF (paroxysmal atrial fibrillation) (HCC)    a. post-op hip in 2014 - prev on xarelto.  . Shortness of breath dyspnea     Past Surgical History:  Procedure Laterality Date  . CARDIAC CATHETERIZATION  1980's  . CIRCUMCISION  1940's  . CYSTOSCOPY WITH URETHRAL DILATATION  10/23/2013  . CYSTOSCOPY WITH URETHRAL DILATATION N/A 10/23/2013   Procedure: CYSTOSCOPY WITH URETHRAL DILATATION;  Surgeon: Kathi Ludwig, MD;  Location: [facility] OR;  Service: Urology;  Laterality: N/A;  . INCISION AND DRAINAGE OF WOUND Right 1980's   "cat scratch" (10/23/2013)  . INGUINAL HERNIA REPAIR Right 1950's  . KIDNEY STONE SURGERY Right 1970's; 1983   "twice"  . TONSILLECTOMY AND ADENOIDECTOMY  1940's  . TOTAL HIP ARTHROPLASTY Left 10/23/2013  . TOTAL HIP ARTHROPLASTY Left 10/23/2013   Procedure: TOTAL HIP ARTHROPLASTY;  Surgeon: Valeria Batman, MD;  Location: [facility] OR;  Service: Orthopedics;  Laterality: Left;    Social History:  reports that  he has quit smoking. His smoking use included Cigarettes and Cigars. He has a 15.00 pack-year smoking history. He has quit using smokeless tobacco. He reports that he does not drink alcohol or use drugs.  Family History:  Family History  Problem Relation Age of Onset  . Heart Problems Maternal Grandmother   . Other Unknown        negative for premature CAD     Prior to Admission medications   Medication Sig Start  Date End Date Taking? Authorizing Provider  allopurinol (ZYLOPRIM) 100 MG tablet Take 200 mg by mouth daily.   Yes [provider]  apixaban (ELIQUIS) 5 MG TABS tablet Take 1 tablet (5 mg total) by mouth 2 (two) times daily. 02/06/17  Yes Amin, Ankit Chirag, MD  carvedilol (COREG) 12.5 MG tablet Take 12.5 mg by mouth 2 (two) times daily with a meal.   Yes [provider]  cyanocobalamin 1000 MCG tablet Take 1,000 mcg by mouth daily.    Yes [provider]  diphenhydrAMINE (BENADRYL) 25 mg capsule Take 25 mg by mouth at bedtime as needed for sleep.   Yes [provider]  finasteride (PROSCAR) 5 MG tablet Take 5 mg by mouth daily.   Yes [provider]  furosemide (LASIX) 20 MG tablet Take 3 tabs every morning and take 2 tabs every evening Patient taking differently: Take 40-60 mg by mouth See admin instructions. Take 3 tabs every morning and take 2 tabs every evening 03/25/17  Yes Johnson, Clanford L, MD  glipiZIDE (GLUCOTROL) 5 MG tablet Take 5 mg by mouth daily before breakfast.   Yes [provider]  guaifenesin (ROBITUSSIN) 100 MG/5ML syrup Take 100 mg by mouth 3 (three) times daily as needed for cough.   Yes [provider]  hydrALAZINE (APRESOLINE) 25 MG tablet Take 25 mg by mouth 3 (three) times daily.   Yes [provider]  HYDROcodone-acetaminophen (NORCO/VICODIN) 5-325 MG tablet Take 2 tablets by mouth every 4 (four) hours as needed. Patient taking differently: Take 1 tablet by mouth 2 (two) times daily as needed (for back pain).  04/09/16  Yes Isa Rankin, MD  isosorbide dinitrate (DILATRATE-SR) 40 MG CR capsule Take 40 mg by mouth 3 (three) times daily.   Yes [provider]  ketoconazole (NIZORAL) 2 % shampoo Apply 1 application topically 2 (two) times a week.   Yes [provider]  sertraline (ZOLOFT) 25 MG tablet Take 50 mg by mouth daily as needed (for anxiety).    Yes [provider]    simvastatin (ZOCOR) 80 MG tablet Take 40 mg by mouth daily.   Yes [provider]  spironolactone (ALDACTONE) 25 MG tablet Take 25 mg by mouth daily.   Yes [provider]  tamsulosin (FLOMAX) 0.4 MG CAPS capsule Take 0.4 mg by mouth at bedtime.   Yes [provider]    Physical Exam: Vitals:   04/14/17 0330 04/14/17 0345 04/14/17 0400 04/14/17 0415  BP: 124/74 117/76 125/88 122/74  Pulse: 70 70 71 71  Resp: (!) 25 18 (!) 26 16  Temp:      TempSrc:      SpO2: 94% 93% 93% 98%  Weight:      Height:       General: Not in acute distress HEENT:       Eyes: PERRL, EOMI, no scleral icterus.       ENT: No discharge from the ears and nose, no pharynx injection, no tonsillar  enlargement.        Neck: No JVD, no bruit, no mass felt. Heme: No neck lymph node enlargement. Cardiac: S1/S2, RRR, No murmurs, No gallops or rubs. Respiratory:  No rales, wheezing, rhonchi or rubs. GI: Soft, nondistended, nontender, no rebound pain, no organomegaly, BS present. GU: No hematuria Ext: mild pitting leg edema bilaterally. 2+DP/PT pulse bilaterally. Musculoskeletal: No joint deformities, No joint redness or warmth, no limitation of ROM in spin. Skin: No rashes.  Neuro: Alert, oriented X3, cranial nerves II-XII grossly intact, moves all extremities normally.  Psych: Patient is not psychotic, no suicidal or hemocidal ideation.  Labs on Admission: I have personally reviewed following labs and imaging studies  CBC:  Recent Labs Lab 04/14/17 0217  WBC 8.9  NEUTROABS 6.6  HGB 11.1*  HCT 34.7*  MCV 94.3  PLT 196   Basic Metabolic Panel:  Recent Labs Lab 04/14/17 0217  NA 141  K 3.7  CL 105  CO2 24  GLUCOSE 131*  BUN 22*  CREATININE 1.28*  CALCIUM 8.6*   GFR: Estimated Creatinine Clearance: 55.8 mL/min (A) (by C-G formula based on SCr of 1.28 mg/dL (H)). Liver Function Tests:  Recent Labs Lab 04/14/17 0217  AST 26  ALT 18  ALKPHOS 48  BILITOT 1.0   PROT 6.7  ALBUMIN 3.2*   No results for input(s): LIPASE, AMYLASE in the last 168 hours. No results for input(s): AMMONIA in the last 168 hours. Coagulation Profile: No results for input(s): INR, PROTIME in the last 168 hours. Cardiac Enzymes:  Recent Labs Lab 04/14/17 0217 04/14/17 0425  TROPONINI 0.05* 0.06*   BNP (last 3 results) No results for input(s): PROBNP in the last 8760 hours. HbA1C: No results for input(s): HGBA1C in the last 72 hours. CBG: No results for input(s): GLUCAP in the last 168 hours. Lipid Profile: No results for input(s): CHOL, HDL, LDLCALC, TRIG, CHOLHDL, LDLDIRECT in the last 72 hours. Thyroid Function Tests: No results for input(s): TSH, T4TOTAL, FREET4, T3FREE, THYROIDAB in the last 72 hours. Anemia Panel: No results for input(s): VITAMINB12, FOLATE, FERRITIN, TIBC, IRON, RETICCTPCT in the last 72 hours. Urine analysis:    Component Value Date/Time   COLORURINE YELLOW 05/20/2016 0301   APPEARANCEUR CLEAR 05/20/2016 0301   LABSPEC 1.012 05/20/2016 0301   PHURINE 5.0 05/20/2016 0301   GLUCOSEU NEGATIVE 05/20/2016 0301   HGBUR TRACE (A) 05/20/2016 0301   BILIRUBINUR NEGATIVE 05/20/2016 0301   KETONESUR NEGATIVE 05/20/2016 0301   PROTEINUR NEGATIVE 05/20/2016 0301   UROBILINOGEN 0.2 05/26/2015 1521   NITRITE POSITIVE (A) 05/20/2016 0301   LEUKOCYTESUR MODERATE (A) 05/20/2016 0301   Sepsis Labs: @LABRCNTIP (procalcitonin:4,lacticidven:4) )No results found for this or any previous visit (from the past 240 hour(s)).   Radiological Exams on Admission: Dg Chest Port 1 View  Result Date: 04/14/2017 CLINICAL DATA:  Shortness of breath EXAM: PORTABLE CHEST 1 VIEW COMPARISON:  None. FINDINGS: Cardiomediastinal silhouette is enlarged. There is a left chest wall single lead AICD with tip overlying the left ventricle. There is pulmonary vascular congestion without overt edema. No focal airspace consolidation. No pneumothorax or pleural effusion.  IMPRESSION: Cardiomegaly and pulmonary vascular congestion without overt pulmonary edema. Electronically Signed   By: Deatra Robinson M.D.   On: 04/14/2017 02:15     EKG: Independently reviewed. Regular rhythm, QTC 503, T-wave flattening, ? Second-degree AV block.  Assessment/Plan Principal Problem:   Acute respiratory failure with hypoxia (HCC) Active Problems:   Obesity (BMI 30-39.9)   Essential hypertension  Hyperlipidemia   Atrial fibrillation (HCC)   Pulmonary embolism (HCC)   CKD (chronic kidney disease), stage III   ICD (implantable cardioverter-defibrillator) in place   Acute on chronic combined systolic and diastolic CHF (congestive heart failure) (HCC)   Depression   Diabetes mellitus with complication (HCC)   CHF exacerbation (HCC)   Elevated troponin   Acute respiratory failure with hypoxia: most likely due to acute on chronic combined systolic and diastolic CHF. No fever or leukocytosis, no infiltration on chest x-ray for pneumonia. Patient is on Eliquis, less likely to have new PE. 2-D echo on 02/05/17 showed EF of 15-20 percent with grade 1 diastolic dysfunction.Though patient does not have significant leg edema and weight gain, his BNP is elevated at 243,  consistent with CHF exacerbation. He has very low EF, it is likely that the patient cannot tolerate even mild fluid overload.   -will admit to tele bed as inpt -Lasix 60 mg bid by IV -continue isosorbide dinitrate -Continue spironolactone -will continue home coreg, but will decrease Coreg dose from 12.5 to 6.125 mg twice a day -Daily weights -strict I/O's -Low salt diet -will not repeat 2d echo since pt had one on 02/05/17  Acute on chronic combined systolic and diastolic CHF: s/p of ICD -see above  HTN: -continue Coreg, hydralazine -On IV Lasix and  Chronically elevated troponin: Baseline troponin 0.05-0.14. His troponin is 0.05, 0.06 on admission, no chest pain  -trend trop x 3 -continue lipitor and  coreg  HLD: -lipitor  Atrial Fibrillation: CHA2DS2-VASc Score is 5, needs oral anticoagulation. Patient is on Eliquis at home. Heart rate is well controlled. -continue Eliquis and coreg  Hx of PE; -on Eliquis   CKD-III: stable. Baseline creatinine 1.2-1.3. His creatinine is 1.28, BUN 22. -Follow-up renal function by BMP  Depression: Stable, no suicidal or homicidal ideations. -Continue home medications: Zoloft,  Gout: -continue home allopurinol  BPH: stable - Continue Flomax and Proscar  DM-II: Last A1c 6.3 on 02/03/18, well controled. Patient is taking glipizide at home -SSI   DVT ppx: on eliquis Code Status: Full code Family Communication: Yes, patient's wife at bed side Disposition Plan:  Anticipate discharge back to previous home environment Consults called:  none Admission status: Inpatient/tele    Date of Service 04/14/2017    Lorretta Harp [facility] Pager [phone]  If 7PM-7AM, please contact night-coverage [email or web address] Password [email or web address] 04/14/2017, 6:20 AM

## 2017-04-14 NOTE — ED Notes (Signed)
Pt ambulated well pt saturations started out at 91% and decreased the further he walked to 82% no complaints of SOB while ambulating no other complaint noted at this time

## 2017-04-15 LAB — BASIC METABOLIC PANEL
Anion gap: 10 (ref 5–15)
Anion gap: 9 (ref 5–15)
BUN: 18 mg/dL (ref 6–20)
BUN: 20 mg/dL (ref 6–20)
CO2: 28 mmol/L (ref 22–32)
CO2: 27 mmol/L (ref 22–32)
CREATININE: 1.31 mg/dL — AB (ref 0.61–1.24)
CREATININE: 1.21 mg/dL (ref 0.61–1.24)
Calcium: 8.8 mg/dL — ABNORMAL LOW (ref 8.9–10.3)
Calcium: 9 mg/dL (ref 8.9–10.3)
Chloride: 102 mmol/L (ref 101–111)
Chloride: 102 mmol/L (ref 101–111)
GFR calc Af Amer: 58 mL/min — ABNORMAL LOW (ref >60)
GFR calc Af Amer: >60 mL/min (ref >60)
GFR, EST NON AFRICAN AMERICAN: 50 mL/min — AB (ref >60)
GFR, EST NON AFRICAN AMERICAN: 55 mL/min — AB (ref >60)
Glucose, Bld: 140 mg/dL — ABNORMAL HIGH (ref 65–99)
Glucose, Bld: 143 mg/dL — ABNORMAL HIGH (ref 65–99)
Potassium: 2.9 mmol/L — ABNORMAL LOW (ref 3.5–5.1)
Potassium: 3.6 mmol/L (ref 3.5–5.1)
SODIUM: 140 mmol/L (ref 135–145)
SODIUM: 138 mmol/L (ref 135–145)

## 2017-04-15 LAB — GLUCOSE, CAPILLARY
GLUCOSE-CAPILLARY: 119 mg/dL — AB (ref 65–99)
GLUCOSE-CAPILLARY: 135 mg/dL — AB (ref 65–99)
Glucose-Capillary: 147 mg/dL — ABNORMAL HIGH (ref 65–99)
Glucose-Capillary: 119 mg/dL — ABNORMAL HIGH (ref 65–99)

## 2017-04-15 MED ORDER — FUROSEMIDE 20 MG PO TABS
60.0000 mg | ORAL_TABLET | Freq: Two times a day (BID) | ORAL | Status: DC
  Administered 2017-04-15: 60 mg via ORAL
  Filled 2017-04-15: qty 1

## 2017-04-15 MED ORDER — POTASSIUM CHLORIDE CRYS ER 20 MEQ PO TBCR
40.0000 meq | EXTENDED_RELEASE_TABLET | ORAL | Status: AC
  Administered 2017-04-15 (×2): 40 meq via ORAL
  Filled 2017-04-15 (×2): qty 2

## 2017-04-15 MED ORDER — FUROSEMIDE 20 MG PO TABS: 60.0000 mg | ORAL_TABLET | Freq: Two times a day (BID) | ORAL | 0 refills | Status: DC

## 2017-04-15 MED ORDER — POTASSIUM CHLORIDE ER 10 MEQ PO TBCR: 20.0000 meq | EXTENDED_RELEASE_TABLET | Freq: Every day | ORAL | 0 refills | Status: DC

## 2017-04-15 MED ORDER — CARVEDILOL 12.5 MG PO TABS: 6.2500 mg | ORAL_TABLET | Freq: Two times a day (BID) | ORAL | 0 refills | Status: DC

## 2017-04-15 NOTE — Progress Notes (Signed)
Patient progressing in care, no concerns or complaints from patient. Uses oxygen via nasal cannula PRN. Did have runs of vtach throughout the night (MD aware).

## 2017-04-15 NOTE — Discharge Summary (Signed)
[facility]  Physician Discharge Summary   Patient ID: Ryan Macdonald MRN: [id number] DOB/AGE: December 26, 1933 83 y.o.  Admit date: 04/14/2017 Discharge date: 04/15/2017  PCP: [facility]  DISCHARGE DIAGNOSES:  Principal Problem:   Acute respiratory failure with hypoxia (HCC) Active Problems:   Obesity (BMI 30-39.9)   Essential hypertension   Hyperlipidemia   Atrial fibrillation (HCC)   Pulmonary embolism (HCC)   CKD (chronic kidney disease), stage III   ICD (implantable cardioverter-defibrillator) in place   Acute on chronic combined systolic and diastolic CHF (congestive heart failure) (HCC)   Depression   Diabetes mellitus with complication (HCC)   CHF exacerbation (HCC)   Elevated troponin   RECOMMENDATIONS FOR OUTPATIENT FOLLOW UP: 1. Close outpatient follow-up with physicians at the [facility] 2. His providers at the [facility] to consider referral to nephrology   DISCHARGE CONDITION: fair  Diet recommendation: Heart healthy  Filed Weights   04/14/17 0155 04/14/17 0804 04/15/17 0348  Weight: 115.2 kg (254 lb) 115.7 kg (255 lb 1.6 oz) 114.4 kg (252 lb 3.2 oz)    INITIAL HISTORY: 83 year old male with a past medical history of hypertension, hyperlipidemia, diabetes, history of paroxysmal atrial fibrillation on anticoagulation, history of combined systolic and diastolic CHF with an EF of 15-20% status post ICD placement, presented with shortness of breath. Found to be in acute CHF. He was hospitalized.  Consultations:  None  Procedures:  None   HOSPITAL COURSE:   Acute respiratory failure with hypoxia: Cirrhosis, most likely secondary to acute on chronic. My systolic and asked CHF. Patient was diuresed. He has lost weight. Oxygenation has improved.   Acute on chronic combined systolic and diastolic CHF: s/p of ICD Patient was aggressively diuresed. He has lost weight. He will be discharged on a higher dose of diuretics. He has been asked to talk to his  physicians about referring him to a nephrologist. This is patient's third admission this year for fluid overload. He tells me that he has been compliant with his medications. Lasix dose was increased to 60mg  in am and 40mg  in pm during his previous hospitalization. Will be increased again to 60mg  twice daily this time. He is on a beta blocker. Carvedilol dose will be reduced due to borderline low blood pressures. Cannot do Ace inhibitors or ARB due to history of angioedema. Patient noted to be hypokalemic this morning. Potassium was aggressively repeated. Repeat blood work shows improved levels.  Essential hypertension  As mentioned above. Blood pressure is noted to be of somewhat on the lower side. Dose of beta blocker reduced.  Chronically elevated troponin Baseline troponin 0.05-0.14. His troponin is 0.05, 0.06 on admission, no chest pain. No significant rise in troponin. No further workup at this time.  Hyperlipidemia  Continue lipitor  Atrial Fibrillation CHA2DS2-VASc Score is 5. Patient is on Eliquis at home. Heart rate is well controlled. Continue Eliquis and coreg.  Hx of PE On Eliquis   CKD-III Stable. Baseline creatinine 1.2-1.3. Even though his creatinine is at baseline is quite possible that his GFR has reduced further. This could be another reason for his episodes of fluid overload. He has been asked to talk to his primary providers regarding referral to nephrology.  Depression Continue home medications: Zoloft,  Gout: Continue home allopurinol  BPH Continue Flomax and Proscar  DM-II: Last A1c 6.3 on 02/03/18, well controled. Patient is taking glipizide at home.   Overall, stable. Much improved. Okay for discharge   PERTINENT LABS:  The results of significant  diagnostics from this hospitalization (including imaging, microbiology, ancillary and laboratory) are listed below for reference.      Labs: Basic Metabolic Panel:  Recent Labs Lab  04/14/17 0217 04/15/17 0544 04/15/17 1522  NA 141 140 138  K 3.7 2.9* 3.6  CL 105 102 102  CO2 24 28 27   GLUCOSE 131* 140* 143*  BUN 22* 18 20  CREATININE 1.28* 1.31* 1.21  CALCIUM 8.6* 8.8* 9.0   Liver Function Tests:  Recent Labs Lab 04/14/17 0217  AST 26  ALT 18  ALKPHOS 48  BILITOT 1.0  PROT 6.7  ALBUMIN 3.2*   CBC:  Recent Labs Lab 04/14/17 0217  WBC 8.9  NEUTROABS 6.6  HGB 11.1*  HCT 34.7*  MCV 94.3  PLT 196   Cardiac Enzymes:  Recent Labs Lab 04/14/17 0217 04/14/17 0425 04/14/17 0629 04/14/17 1147 04/14/17 1808  TROPONINI 0.05* 0.06* 0.05* 0.05* 0.05*   BNP: BNP (last 3 results)  Recent Labs  02/03/17 1024 03/23/17 1300 04/14/17 0217  BNP 375.3* 377.3* 243.0*    CBG:  Recent Labs Lab 04/14/17 1655 04/14/17 2045 04/15/17 0713 04/15/17 1149 04/15/17 1556  GLUCAP 147* 123* 119* 119* 135*     IMAGING STUDIES Dg Chest Port 1 View  Result Date: 04/14/2017 CLINICAL DATA:  Shortness of breath EXAM: PORTABLE CHEST 1 VIEW COMPARISON:  None. FINDINGS: Cardiomediastinal silhouette is enlarged. There is a left chest wall single lead AICD with tip overlying the left ventricle. There is pulmonary vascular congestion without overt edema. No focal airspace consolidation. No pneumothorax or pleural effusion. IMPRESSION: Cardiomegaly and pulmonary vascular congestion without overt pulmonary edema. Electronically Signed   By: Deatra Robinson M.D.   On: 04/14/2017 02:15   Dg Chest Portable 1 View  Result Date: 03/23/2017 CLINICAL DATA:  Severe shortness of breath.  History of CHF. EXAM: PORTABLE CHEST 1 VIEW COMPARISON:  02/04/2017. FINDINGS: Single lead pacemaker appears unchanged, LEFT subclavian approach, tip in RIGHT ventricle. Moderate vascular congestion, early CHF, with cardiomegaly, increased from priors. Increased cardiomediastinal silhouette. Negative osseous structures. IMPRESSION: Early CHF. Electronically Signed   By: Elsie Stain M.D.    On: 03/23/2017 13:14    DISCHARGE EXAMINATION: Vitals:   04/15/17 0847 04/15/17 0900 04/15/17 1100 04/15/17 1200  BP: (!) 99/58 (!) 113/58 98/66 109/64  Pulse:  80  72  Resp:  18  18  Temp:  98.8 F (37.1 C)  97.7 F (36.5 C)  TempSrc:  Oral  Oral  SpO2:  96%  99%  Weight:      Height:       General appearance: alert, cooperative, appears stated age and no distress Resp: clear to auscultation bilaterally Cardio: regular rate and rhythm, S1, S2 normal, no murmur, click, rub or gallop GI: soft, non-tender; bowel sounds normal; no masses,  no organomegaly   DISPOSITION: home  Discharge Instructions    (HEART FAILURE PATIENTS) Call MD:  Anytime you have any of the following symptoms: 1) 3 pound weight gain in 24 hours or 5 pounds in 1 week 2) shortness of breath, with or without a dry hacking cough 3) swelling in the hands, feet or stomach 4) if you have to sleep on extra pillows at night in order to breathe.    Complete by:  As directed    Call MD for:  extreme fatigue    Complete by:  As directed    Call MD for:  persistant dizziness or light-headedness    Complete by:  As directed  Call MD for:  persistant nausea and vomiting    Complete by:  As directed    Call MD for:  severe uncontrolled pain    Complete by:  As directed    Call MD for:  temperature >100.4    Complete by:  As directed    Diet - low sodium heart healthy    Complete by:  As directed    Discharge instructions    Complete by:  As directed    Please be sure to follow-up with her providers at the [place]. Take your medications as prescribed. Check your weight daily. Talk to your doctors to see if you needs to be seen by a kidney doctor.  You were cared for by a hospitalist during your hospital stay. If you have any questions about your discharge medications or the care you received while you were in the hospital after you are discharged, you can call the unit and asked to speak with the hospitalist on call if the  hospitalist that took care of you is not available. Once you are discharged, your primary care physician will handle any further medical issues. Please note that NO REFILLS for any discharge medications will be authorized once you are discharged, as it is imperative that you return to your primary care physician (or establish a relationship with a primary care physician if you do not have one) for your aftercare needs so that they can reassess your need for medications and monitor your lab values. If you do not have a primary care physician, you can call [phone] for a physician referral.   Increase activity slowly    Complete by:  As directed       ALLERGIES:  Allergies  Allergen Reactions  . Lisinopril Swelling     Current Discharge Medication List    START taking these medications   Details  potassium chloride (K-DUR) 10 MEQ tablet Take 2 tablets (20 mEq total) by mouth daily. Qty: 10 tablet, Refills: 0      CONTINUE these medications which have CHANGED   Details  carvedilol (COREG) 12.5 MG tablet Take 0.5 tablets (6.25 mg total) by mouth 2 (two) times daily with a meal. Qty: 30 tablet, Refills: 0    furosemide (LASIX) 20 MG tablet Take 3 tablets (60 mg total) by mouth 2 (two) times daily. Qty: 180 tablet, Refills: 0      CONTINUE these medications which have NOT CHANGED   Details  allopurinol (ZYLOPRIM) 100 MG tablet Take 200 mg by mouth daily.    apixaban (ELIQUIS) 5 MG TABS tablet Take 1 tablet (5 mg total) by mouth 2 (two) times daily. Qty: 60 tablet, Refills: 0    cyanocobalamin 1000 MCG tablet Take 1,000 mcg by mouth daily.     diphenhydrAMINE (BENADRYL) 25 mg capsule Take 25 mg by mouth at bedtime as needed for sleep.    finasteride (PROSCAR) 5 MG tablet Take 5 mg by mouth daily.    glipiZIDE (GLUCOTROL) 5 MG tablet Take 5 mg by mouth daily before breakfast.    guaifenesin (ROBITUSSIN) 100 MG/5ML syrup Take 100 mg by mouth 3 (three) times daily as needed for  cough.    hydrALAZINE (APRESOLINE) 25 MG tablet Take 25 mg by mouth 3 (three) times daily.    HYDROcodone-acetaminophen (NORCO/VICODIN) 5-325 MG tablet Take 2 tablets by mouth every 4 (four) hours as needed. Qty: 6 tablet, Refills: 0    isosorbide dinitrate (DILATRATE-SR) 40 MG CR capsule Take 40 mg by  mouth 3 (three) times daily.    ketoconazole (NIZORAL) 2 % shampoo Apply 1 application topically 2 (two) times a week.    sertraline (ZOLOFT) 25 MG tablet Take 50 mg by mouth daily as needed (for anxiety).     simvastatin (ZOCOR) 80 MG tablet Take 40 mg by mouth daily.    spironolactone (ALDACTONE) 25 MG tablet Take 25 mg by mouth daily.    tamsulosin (FLOMAX) 0.4 MG CAPS capsule Take 0.4 mg by mouth at bedtime.        Follow-up Information    [facility].   Specialty:  General Practice Contact information: [address] [place] [address] [phone]           TOTAL DISCHARGE TIME: 35 mins  [facility]  [facility] Pager [phone]  04/15/2017, 4:55 PM

## 2017-04-15 NOTE — Progress Notes (Signed)
Phlebotomy at bedside.

## 2017-04-15 NOTE — Progress Notes (Signed)
Pt is c/a/ox4, VS are WNL. Pt is accompanied by family; who will be driving him home.   Pt d/c from tele, IV d/c, no complications/complaints prior to departure.

## 2017-04-15 NOTE — Progress Notes (Signed)
Per Dr Rito Ehrlich call placed to phlebotomy to request BMP re-draw for K check. Phlebotomist not available.   Upon callback, lab staff reported phlebotomist is on lunch, should return to floor in about 15 minutes.

## 2017-04-15 NOTE — Progress Notes (Signed)
Call placed to [facility] to notify of telemetry monitoring d/c.   

## 2017-04-15 NOTE — Progress Notes (Signed)
Pharmacist Heart Failure Core Measure Documentation  Assessment: Antares Loy has an EF documented as 15-20% on 02/05/17 by ECHO.  Rationale: Heart failure patients with left ventricular systolic dysfunction (LVSD) and an EF < 40% should be prescribed an angiotensin converting enzyme inhibitor (ACEI) or angiotensin receptor blocker (ARB) at discharge unless a contraindication is documented in the medical record.  This patient is not currently on an ACEI or ARB for HF.  This note is being placed in the record in order to provide documentation that a contraindication to the use of these agents is present for this encounter.  ACE Inhibitor or Angiotensin Receptor Blocker is contraindicated (specify all that apply)  [x]   Angioedema  Fayne Norrie 04/15/2017 10:27 AM

## 2017-05-10 ENCOUNTER — Inpatient Hospital Stay (HOSPITAL_COMMUNITY)
Admission: EM | Admit: 2017-05-10 | Discharge: 2017-05-14 | DRG: 291 | Disposition: A | Discharge status: Home / Self Care | Payer: Medicare Other | Attending: Internal Medicine | Admitting: Internal Medicine

## 2017-05-10 ENCOUNTER — Emergency Department (HOSPITAL_COMMUNITY): Payer: Medicare Other

## 2017-05-10 ENCOUNTER — Encounter (HOSPITAL_COMMUNITY): Payer: Self-pay | Admitting: Emergency Medicine

## 2017-05-10 DIAGNOSIS — N183 Chronic kidney disease, stage 3 unspecified: Secondary | ICD-10-CM | POA: Diagnosis present

## 2017-05-10 DIAGNOSIS — Z79899 Other long term (current) drug therapy: Secondary | ICD-10-CM

## 2017-05-10 DIAGNOSIS — Z86711 Personal history of pulmonary embolism: Secondary | ICD-10-CM

## 2017-05-10 DIAGNOSIS — J9601 Acute respiratory failure with hypoxia: Secondary | ICD-10-CM | POA: Diagnosis present

## 2017-05-10 DIAGNOSIS — N400 Enlarged prostate without lower urinary tract symptoms: Secondary | ICD-10-CM | POA: Diagnosis present

## 2017-05-10 DIAGNOSIS — Z6839 Body mass index (BMI) 39.0-39.9, adult: Secondary | ICD-10-CM

## 2017-05-10 DIAGNOSIS — I130 Hypertensive heart and chronic kidney disease with heart failure and stage 1 through stage 4 chronic kidney disease, or unspecified chronic kidney disease: Principal | ICD-10-CM | POA: Diagnosis present

## 2017-05-10 DIAGNOSIS — R0602 Shortness of breath: Secondary | ICD-10-CM | POA: Diagnosis not present

## 2017-05-10 DIAGNOSIS — I4891 Unspecified atrial fibrillation: Secondary | ICD-10-CM | POA: Diagnosis present

## 2017-05-10 DIAGNOSIS — I5043 Acute on chronic combined systolic (congestive) and diastolic (congestive) heart failure: Secondary | ICD-10-CM

## 2017-05-10 DIAGNOSIS — Z87891 Personal history of nicotine dependence: Secondary | ICD-10-CM

## 2017-05-10 DIAGNOSIS — Z7901 Long term (current) use of anticoagulants: Secondary | ICD-10-CM

## 2017-05-10 DIAGNOSIS — Z95810 Presence of automatic (implantable) cardiac defibrillator: Secondary | ICD-10-CM

## 2017-05-10 DIAGNOSIS — I10 Essential (primary) hypertension: Secondary | ICD-10-CM

## 2017-05-10 DIAGNOSIS — E118 Type 2 diabetes mellitus with unspecified complications: Secondary | ICD-10-CM | POA: Diagnosis present

## 2017-05-10 DIAGNOSIS — E785 Hyperlipidemia, unspecified: Secondary | ICD-10-CM | POA: Diagnosis present

## 2017-05-10 DIAGNOSIS — Z7984 Long term (current) use of oral hypoglycemic drugs: Secondary | ICD-10-CM

## 2017-05-10 DIAGNOSIS — J209 Acute bronchitis, unspecified: Secondary | ICD-10-CM | POA: Diagnosis present

## 2017-05-10 DIAGNOSIS — I509 Heart failure, unspecified: Secondary | ICD-10-CM

## 2017-05-10 DIAGNOSIS — Z888 Allergy status to other drugs, medicaments and biological substances status: Secondary | ICD-10-CM

## 2017-05-10 DIAGNOSIS — M109 Gout, unspecified: Secondary | ICD-10-CM | POA: Diagnosis present

## 2017-05-10 DIAGNOSIS — E876 Hypokalemia: Secondary | ICD-10-CM | POA: Diagnosis not present

## 2017-05-10 DIAGNOSIS — I428 Other cardiomyopathies: Secondary | ICD-10-CM | POA: Diagnosis present

## 2017-05-10 DIAGNOSIS — I472 Ventricular tachycardia: Secondary | ICD-10-CM | POA: Diagnosis not present

## 2017-05-10 DIAGNOSIS — E7800 Pure hypercholesterolemia, unspecified: Secondary | ICD-10-CM | POA: Diagnosis present

## 2017-05-10 DIAGNOSIS — I482 Chronic atrial fibrillation: Secondary | ICD-10-CM

## 2017-05-10 DIAGNOSIS — E669 Obesity, unspecified: Secondary | ICD-10-CM | POA: Diagnosis present

## 2017-05-10 DIAGNOSIS — Z96642 Presence of left artificial hip joint: Secondary | ICD-10-CM | POA: Diagnosis present

## 2017-05-10 DIAGNOSIS — E1122 Type 2 diabetes mellitus with diabetic chronic kidney disease: Secondary | ICD-10-CM | POA: Diagnosis present

## 2017-05-10 DIAGNOSIS — J9600 Acute respiratory failure, unspecified whether with hypoxia or hypercapnia: Secondary | ICD-10-CM | POA: Diagnosis present

## 2017-05-10 DIAGNOSIS — I480 Paroxysmal atrial fibrillation: Secondary | ICD-10-CM | POA: Diagnosis present

## 2017-05-10 DIAGNOSIS — R062 Wheezing: Secondary | ICD-10-CM

## 2017-05-10 HISTORY — DX: Acute bronchitis, unspecified: J20.9

## 2017-05-10 LAB — GLUCOSE, CAPILLARY: GLUCOSE-CAPILLARY: 163 mg/dL — AB (ref 65–99)

## 2017-05-10 LAB — CBC WITH DIFFERENTIAL/PLATELET
BASOS ABS: 0 10*3/uL (ref 0.0–0.1)
Basophils Relative: 0 %
Eosinophils Absolute: 0.2 10*3/uL (ref 0.0–0.7)
Eosinophils Relative: 2 %
HEMATOCRIT: 38 % — AB (ref 39.0–52.0)
HEMOGLOBIN: 12.2 g/dL — AB (ref 13.0–17.0)
LYMPHS PCT: 10 %
Lymphs Abs: 1.1 10*3/uL (ref 0.7–4.0)
MCH: 30.3 pg (ref 26.0–34.0)
MCHC: 32.1 g/dL (ref 30.0–36.0)
MCV: 94.3 fL (ref 78.0–100.0)
MONOS PCT: 6 %
Monocytes Absolute: 0.7 10*3/uL (ref 0.1–1.0)
NEUTROS PCT: 82 %
Neutro Abs: 9 10*3/uL — ABNORMAL HIGH (ref 1.7–7.7)
Platelets: 223 10*3/uL (ref 150–400)
RBC: 4.03 MIL/uL — ABNORMAL LOW (ref 4.22–5.81)
RDW: 13.8 % (ref 11.5–15.5)
WBC: 11 10*3/uL — ABNORMAL HIGH (ref 4.0–10.5)

## 2017-05-10 LAB — BASIC METABOLIC PANEL
ANION GAP: 8 (ref 5–15)
BUN: 19 mg/dL (ref 6–20)
CALCIUM: 9 mg/dL (ref 8.9–10.3)
CHLORIDE: 105 mmol/L (ref 101–111)
CO2: 29 mmol/L (ref 22–32)
Creatinine, Ser: 1.19 mg/dL (ref 0.61–1.24)
GFR calc Af Amer: >60 mL/min (ref >60)
GFR calc non Af Amer: 56 mL/min — ABNORMAL LOW (ref >60)
GLUCOSE: 192 mg/dL — AB (ref 65–99)
Potassium: 4.1 mmol/L (ref 3.5–5.1)
Sodium: 142 mmol/L (ref 135–145)

## 2017-05-10 LAB — I-STAT ARTERIAL BLOOD GAS, ED
ACID-BASE EXCESS: 1 mmol/L (ref 0.0–2.0)
BICARBONATE: 26.7 mmol/L (ref 20.0–28.0)
O2 Saturation: 92 %
PH ART: 7.397 (ref 7.350–7.450)
PO2 ART: 64 mmHg — AB (ref 83.0–108.0)
TCO2: 28 mmol/L (ref 0–100)
pCO2 arterial: 43.3 mmHg (ref 32.0–48.0)

## 2017-05-10 LAB — I-STAT TROPONIN, ED: Troponin i, poc: 0.04 ng/mL (ref 0.00–0.08)

## 2017-05-10 LAB — BRAIN NATRIURETIC PEPTIDE: B Natriuretic Peptide: 282.1 pg/mL — ABNORMAL HIGH (ref 0.0–100.0)

## 2017-05-10 MED ORDER — FINASTERIDE 5 MG PO TABS
5.0000 mg | ORAL_TABLET | Freq: Every day | ORAL | Status: DC
  Administered 2017-05-11 – 2017-05-14 (×4): 5 mg via ORAL
  Filled 2017-05-10 (×4): qty 1

## 2017-05-10 MED ORDER — INSULIN ASPART 100 UNIT/ML ~~LOC~~ SOLN
0.0000 [IU] | Freq: Every day | SUBCUTANEOUS | Status: DC
  Administered 2017-05-13: 2 [IU] via SUBCUTANEOUS

## 2017-05-10 MED ORDER — IPRATROPIUM-ALBUTEROL 0.5-2.5 (3) MG/3ML IN SOLN
3.0000 mL | Freq: Three times a day (TID) | RESPIRATORY_TRACT | Status: DC
  Administered 2017-05-10: 3 mL via RESPIRATORY_TRACT
  Filled 2017-05-10: qty 3

## 2017-05-10 MED ORDER — PREDNISONE 20 MG PO TABS
20.0000 mg | ORAL_TABLET | Freq: Every day | ORAL | Status: DC
  Administered 2017-05-11 – 2017-05-14 (×4): 20 mg via ORAL
  Filled 2017-05-10 (×4): qty 1

## 2017-05-10 MED ORDER — TAMSULOSIN HCL 0.4 MG PO CAPS
0.4000 mg | ORAL_CAPSULE | Freq: Every day | ORAL | Status: DC
  Administered 2017-05-10 – 2017-05-13 (×4): 0.4 mg via ORAL
  Filled 2017-05-10 (×4): qty 1

## 2017-05-10 MED ORDER — ACETAMINOPHEN 650 MG RE SUPP: 650.0000 mg | Freq: Four times a day (QID) | RECTAL | Status: DC | PRN

## 2017-05-10 MED ORDER — DIPHENHYDRAMINE HCL 25 MG PO CAPS: 25.0000 mg | ORAL_CAPSULE | Freq: Every evening | ORAL | Status: DC | PRN

## 2017-05-10 MED ORDER — IPRATROPIUM-ALBUTEROL 0.5-2.5 (3) MG/3ML IN SOLN
3.0000 mL | Freq: Once | RESPIRATORY_TRACT | Status: AC
  Administered 2017-05-10: 3 mL via RESPIRATORY_TRACT
  Filled 2017-05-10: qty 3

## 2017-05-10 MED ORDER — NITROGLYCERIN IN D5W 200-5 MCG/ML-% IV SOLN
0.0000 ug/min | Freq: Once | INTRAVENOUS | Status: DC
  Filled 2017-05-10: qty 250

## 2017-05-10 MED ORDER — GLIPIZIDE 5 MG PO TABS: 5.0000 mg | ORAL_TABLET | Freq: Every day | ORAL | Status: DC

## 2017-05-10 MED ORDER — GUAIFENESIN 100 MG/5ML PO SYRP
100.0000 mg | ORAL_SOLUTION | Freq: Three times a day (TID) | ORAL | Status: DC | PRN
  Administered 2017-05-13 (×2): 100 mg via ORAL
  Filled 2017-05-10 (×3): qty 5

## 2017-05-10 MED ORDER — METHYLPREDNISOLONE SODIUM SUCC 125 MG IJ SOLR
125.0000 mg | Freq: Once | INTRAMUSCULAR | Status: AC
  Administered 2017-05-10: 125 mg via INTRAVENOUS
  Filled 2017-05-10: qty 2

## 2017-05-10 MED ORDER — ONDANSETRON HCL 4 MG PO TABS: 4.0000 mg | ORAL_TABLET | Freq: Four times a day (QID) | ORAL | Status: DC | PRN

## 2017-05-10 MED ORDER — SPIRONOLACTONE 25 MG PO TABS
25.0000 mg | ORAL_TABLET | Freq: Every day | ORAL | Status: DC
  Administered 2017-05-11 – 2017-05-14 (×4): 25 mg via ORAL
  Filled 2017-05-10 (×4): qty 1

## 2017-05-10 MED ORDER — HYDRALAZINE HCL 25 MG PO TABS
25.0000 mg | ORAL_TABLET | Freq: Three times a day (TID) | ORAL | Status: DC
  Administered 2017-05-10 – 2017-05-14 (×11): 25 mg via ORAL
  Filled 2017-05-10 (×11): qty 1

## 2017-05-10 MED ORDER — APIXABAN 5 MG PO TABS
5.0000 mg | ORAL_TABLET | Freq: Two times a day (BID) | ORAL | Status: DC
  Administered 2017-05-10 – 2017-05-14 (×8): 5 mg via ORAL
  Filled 2017-05-10 (×8): qty 1

## 2017-05-10 MED ORDER — FUROSEMIDE 10 MG/ML IJ SOLN
40.0000 mg | Freq: Two times a day (BID) | INTRAMUSCULAR | Status: DC
  Administered 2017-05-10 – 2017-05-11 (×2): 40 mg via INTRAVENOUS
  Filled 2017-05-10 (×2): qty 4

## 2017-05-10 MED ORDER — ISOSORBIDE DINITRATE 20 MG PO TABS
40.0000 mg | ORAL_TABLET | Freq: Three times a day (TID) | ORAL | Status: DC
  Administered 2017-05-10 – 2017-05-14 (×11): 40 mg via ORAL
  Filled 2017-05-10: qty 4
  Filled 2017-05-10 (×3): qty 2
  Filled 2017-05-10: qty 4
  Filled 2017-05-10: qty 2
  Filled 2017-05-10 (×2): qty 4
  Filled 2017-05-10 (×4): qty 2
  Filled 2017-05-10: qty 4
  Filled 2017-05-10 (×4): qty 2

## 2017-05-10 MED ORDER — HYDROCODONE-ACETAMINOPHEN 5-325 MG PO TABS
1.0000 | ORAL_TABLET | Freq: Two times a day (BID) | ORAL | Status: DC | PRN
  Administered 2017-05-10 – 2017-05-11 (×2): 1 via ORAL
  Filled 2017-05-10 (×2): qty 1

## 2017-05-10 MED ORDER — POTASSIUM CHLORIDE CRYS ER 20 MEQ PO TBCR
20.0000 meq | EXTENDED_RELEASE_TABLET | Freq: Every day | ORAL | Status: DC
  Administered 2017-05-10 – 2017-05-14 (×5): 20 meq via ORAL
  Filled 2017-05-10 (×5): qty 1

## 2017-05-10 MED ORDER — IPRATROPIUM-ALBUTEROL 0.5-2.5 (3) MG/3ML IN SOLN
3.0000 mL | Freq: Three times a day (TID) | RESPIRATORY_TRACT | Status: DC
  Administered 2017-05-11: 3 mL via RESPIRATORY_TRACT
  Filled 2017-05-10 (×2): qty 3

## 2017-05-10 MED ORDER — SODIUM CHLORIDE 0.9% FLUSH
3.0000 mL | Freq: Two times a day (BID) | INTRAVENOUS | Status: DC
  Administered 2017-05-10 – 2017-05-14 (×8): 3 mL via INTRAVENOUS

## 2017-05-10 MED ORDER — FUROSEMIDE 10 MG/ML IJ SOLN
60.0000 mg | Freq: Once | INTRAMUSCULAR | Status: AC
  Administered 2017-05-10: 60 mg via INTRAVENOUS
  Filled 2017-05-10: qty 6

## 2017-05-10 MED ORDER — ACETAMINOPHEN 325 MG PO TABS: 650.0000 mg | ORAL_TABLET | Freq: Four times a day (QID) | ORAL | Status: DC | PRN

## 2017-05-10 MED ORDER — INSULIN ASPART 100 UNIT/ML ~~LOC~~ SOLN
0.0000 [IU] | Freq: Three times a day (TID) | SUBCUTANEOUS | Status: DC
  Administered 2017-05-11 (×2): 4 [IU] via SUBCUTANEOUS
  Administered 2017-05-11: 7 [IU] via SUBCUTANEOUS
  Administered 2017-05-12 – 2017-05-13 (×3): 4 [IU] via SUBCUTANEOUS
  Administered 2017-05-13: 3 [IU] via SUBCUTANEOUS
  Administered 2017-05-14: 4 [IU] via SUBCUTANEOUS

## 2017-05-10 MED ORDER — ALLOPURINOL 100 MG PO TABS
200.0000 mg | ORAL_TABLET | Freq: Every day | ORAL | Status: DC
  Administered 2017-05-10 – 2017-05-14 (×5): 200 mg via ORAL
  Filled 2017-05-10 (×5): qty 2

## 2017-05-10 MED ORDER — SERTRALINE HCL 50 MG PO TABS: 50.0000 mg | ORAL_TABLET | Freq: Every day | ORAL | Status: DC | PRN

## 2017-05-10 MED ORDER — ONDANSETRON HCL 4 MG/2ML IJ SOLN: 4.0000 mg | Freq: Four times a day (QID) | INTRAMUSCULAR | Status: DC | PRN

## 2017-05-10 MED ORDER — CARVEDILOL 6.25 MG PO TABS
6.2500 mg | ORAL_TABLET | Freq: Two times a day (BID) | ORAL | Status: DC
  Administered 2017-05-11 – 2017-05-14 (×7): 6.25 mg via ORAL
  Filled 2017-05-10 (×7): qty 1

## 2017-05-10 MED ORDER — ATORVASTATIN CALCIUM 40 MG PO TABS
40.0000 mg | ORAL_TABLET | Freq: Every day | ORAL | Status: DC
  Administered 2017-05-11 – 2017-05-13 (×3): 40 mg via ORAL
  Filled 2017-05-10 (×3): qty 1

## 2017-05-10 NOTE — ED Provider Notes (Signed)
.   I saw and evaluated the patient, reviewed the resident's note and I agree with the findings and plan.   EKG Interpretation None     83 year old male presents immersed 3 distress. Does have a history of CHF on exam he has rales bilateral bases. Was placed on c-pap by EMS. Patient also hypertensive and placed on nitroglycerin drip. Limited to the hospital pending labs   Lorre Nick, MD 05/10/17 [phone]

## 2017-05-10 NOTE — ED Provider Notes (Signed)
[facility]-EMERGENCY DEPT Provider Note   CSN: [id number] Arrival date & time: 05/10/17  1601     History   Chief Complaint Chief Complaint  Patient presents with  . Shortness of Breath  . Respiratory Distress    HPI Ryan Macdonald is a 83 y.o. male.  The history is provided by the EMS personnel and medical records.  Shortness of Breath  This is a recurrent problem. The average episode lasts 1 hour. The problem occurs continuously.The current episode started 1 to 2 hours ago. The problem has not changed since onset.Treatments tried: CPAP by EMS. The treatment provided mild relief. He has had prior hospitalizations. He has had prior ED visits. Associated medical issues include heart failure.    Past Medical History:  Diagnosis Date  . Cat scratch fever    removed mass on right side neck  . Cataracts, bilateral   . CHF (congestive heart failure) (HCC)   . Diabetes mellitus without complication (HCC)    TYPE 2  . High cholesterol   . Hypertension   . Kidney stones   . Morbid obesity (HCC)   . Nonischemic cardiomyopathy (HCC)    a. ? 2009 Cath in [place] - nl cors per pt;  b. 09/2013 Echo: EF 25-30%, sev diff HK.  . Osteoarthritis   . PAF (paroxysmal atrial fibrillation) (HCC)    a. post-op hip in 2014 - prev on xarelto.  . Shortness of breath dyspnea     Patient Active Problem List   Diagnosis Date Noted  . Acute bronchitis 05/10/2017  . CHF exacerbation (HCC) 04/14/2017  . Elevated troponin 04/14/2017  . Lactic acidosis   . Acute on chronic systolic heart failure, NYHA class 2 (HCC) 02/03/2017  . Diabetes mellitus type 2 in obese (HCC) 02/03/2017  . CAP (community acquired pneumonia) 02/03/2017  . Lobar pneumonia (HCC)   . Diabetes mellitus with complication (HCC)   . Acute on chronic respiratory failure with hypoxia (HCC)   . Acute on chronic combined systolic and diastolic CHF (congestive heart failure) (HCC) 05/19/2016  . Depression 05/19/2016  . Gout 05/19/2016  .  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 NSVT (nonsustained ventricular tachycardia) (HCC)   . CKD (chronic kidney disease), stage III   . Pulmonary embolism (HCC)   . Acute respiratory failure with hypoxia (HCC)   . Osteoarthritis of left hip 10/25/2013  . Atrial fibrillation (HCC) 10/25/2013  . S/P hip replacement 10/24/2013  . Nonischemic cardiomyopathy (HCC)   . Obesity (BMI 30-39.9)   . Essential hypertension   . Hyperlipidemia     Past Surgical History:  Procedure Laterality Date  . CARDIAC CATHETERIZATION  1980's  . CIRCUMCISION  1940's  . CYSTOSCOPY WITH URETHRAL DILATATION  10/23/2013  . CYSTOSCOPY WITH URETHRAL DILATATION N/A 10/23/2013   Procedure: CYSTOSCOPY WITH URETHRAL DILATATION;  Surgeon: Kathi Ludwig, MD;  Location: [facility] OR;  Service: Urology;  Laterality: N/A;  . INCISION AND DRAINAGE OF WOUND Right 1980's   "cat scratch" (10/23/2013)  . INGUINAL HERNIA REPAIR Right 1950's  . KIDNEY STONE SURGERY Right 1970's; 1983   "twice"  . TONSILLECTOMY AND ADENOIDECTOMY  1940's  . TOTAL HIP ARTHROPLASTY Left 10/23/2013  . TOTAL HIP ARTHROPLASTY Left 10/23/2013   Procedure: TOTAL HIP ARTHROPLASTY;  Surgeon: Valeria Batman, MD;  Location: [facility] OR;  Service: Orthopedics;  Laterality: Left;       Home Medications    Prior to Admission medications   Medication Sig Start Date End Date Taking?  Authorizing Provider  allopurinol (ZYLOPRIM) 100 MG tablet Take 200 mg by mouth daily.   Yes [provider]  apixaban (ELIQUIS) 5 MG TABS tablet Take 1 tablet (5 mg total) by mouth 2 (two) times daily. 02/06/17  Yes Amin, Loura Halt, MD  carvedilol (COREG) 12.5 MG tablet Take 0.5 tablets (6.25 mg total) by mouth 2 (two) times daily with a meal. 04/15/17  Yes Osvaldo Shipper, MD  cyanocobalamin 1000 MCG tablet Take 1,000 mcg by mouth daily.    Yes [provider]  diphenhydrAMINE (BENADRYL) 25 mg capsule Take 25 mg by mouth at  bedtime as needed for sleep.   Yes [provider]  finasteride (PROSCAR) 5 MG tablet Take 5 mg by mouth daily.   Yes [provider]  furosemide (LASIX) 20 MG tablet Take 3 tablets (60 mg total) by mouth 2 (two) times daily. 04/15/17  Yes Osvaldo Shipper, MD  glipiZIDE (GLUCOTROL) 5 MG tablet Take 5 mg by mouth daily before breakfast.   Yes [provider]  guaifenesin (ROBITUSSIN) 100 MG/5ML syrup Take 100 mg by mouth 3 (three) times daily as needed for cough.   Yes [provider]  hydrALAZINE (APRESOLINE) 25 MG tablet Take 25 mg by mouth 3 (three) times daily.   Yes [provider]  HYDROcodone-acetaminophen (NORCO/VICODIN) 5-325 MG tablet Take 2 tablets by mouth every 4 (four) hours as needed. Patient taking differently: Take 1 tablet by mouth 2 (two) times daily.  04/09/16  Yes Isa Rankin, MD  isosorbide dinitrate (DILATRATE-SR) 40 MG CR capsule Take 40 mg by mouth 3 (three) times daily.   Yes [provider]  ketoconazole (NIZORAL) 2 % shampoo Apply 1 application topically 2 (two) times a week.   Yes [provider]  MAGNESIUM PO Take 0.5 tablets by mouth 2 (two) times daily.   Yes [provider]  potassium chloride (K-DUR) 10 MEQ tablet Take 2 tablets (20 mEq total) by mouth daily. 04/15/17 05/10/17 Yes Osvaldo Shipper, MD  sertraline (ZOLOFT) 25 MG tablet Take 50 mg by mouth daily as needed (for anxiety).    Yes [provider]  simvastatin (ZOCOR) 80 MG tablet Take 40 mg by mouth daily.   Yes [provider]  spironolactone (ALDACTONE) 25 MG tablet Take 25 mg by mouth daily.   Yes [provider]  tamsulosin (FLOMAX) 0.4 MG CAPS capsule Take 0.4 mg by mouth at bedtime.   Yes [provider]    Family History Family History  Problem Relation Age of Onset  . Heart Problems Maternal Grandmother   . Other Unknown        negative for premature CAD    Social History Social History   Substance Use Topics  . Smoking status: Former Smoker    Packs/day: 1.00    Years: 15.00    Types: Cigarettes, Cigars  . Smokeless tobacco: Former Neurosurgeon     Comment: quit smoking ~ 40 yr ago  . Alcohol use No     Comment: rare beer.     Allergies   Lisinopril   Review of Systems Review of Systems  Unable to perform ROS: Severe respiratory distress  Respiratory: Positive for shortness of breath.      Physical Exam Updated Vital Signs BP 110/66 (BP Location: Left Arm)   Pulse 94   Temp 98.9 F (37.2 C) (Oral)   Resp 20   Ht 5\' 7"  (1.702 m)   Wt 114.3 kg (252 lb)  SpO2 96%   BMI 39.47 kg/m   Physical Exam  Constitutional: He appears well-developed. He appears ill.  HENT:  Head: Normocephalic and atraumatic.  Eyes: Conjunctivae are normal.  Neck: Neck supple.  Cardiovascular: Normal rate and regular rhythm.   No murmur heard. Pulmonary/Chest: Accessory muscle usage present. Tachypnea noted. He is in respiratory distress. He has rales (diffuse crackles).  Abdominal: Soft. He exhibits no distension. There is no tenderness. There is no guarding.  Musculoskeletal: He exhibits edema (1+ BLE pitting edema).  Neurological: He is alert. No cranial nerve deficit. Coordination normal.  5/5 motor strength and intact sensation in all extremities. Intact bilateral finger-to-nose coordination  Skin: Skin is warm and dry.  Nursing note and vitals reviewed.    ED Treatments / Results  Labs (all labs ordered are listed, but only abnormal results are displayed) Labs Reviewed  CBC WITH DIFFERENTIAL/PLATELET - Abnormal; Notable for the following:       Result Value   WBC 11.0 (*)    RBC 4.03 (*)    Hemoglobin 12.2 (*)    HCT 38.0 (*)    Neutro Abs 9.0 (*)    All other components within normal limits  BASIC METABOLIC PANEL - Abnormal; Notable for the following:    Glucose, Bld 192 (*)    GFR calc non Af Amer 56 (*)    All other components within normal limits  BRAIN  NATRIURETIC PEPTIDE - Abnormal; Notable for the following:    B Natriuretic Peptide 282.1 (*)    All other components within normal limits  BASIC METABOLIC PANEL - Abnormal; Notable for the following:    Potassium 3.0 (*)    Glucose, Bld 339 (*)    BUN 22 (*)    Creatinine, Ser 1.52 (*)    Calcium 8.4 (*)    GFR calc non Af Amer 42 (*)    GFR calc Af Amer 48 (*)    All other components within normal limits  GLUCOSE, CAPILLARY - Abnormal; Notable for the following:    Glucose-Capillary 163 (*)    All other components within normal limits  GLUCOSE, CAPILLARY - Abnormal; Notable for the following:    Glucose-Capillary 212 (*)    All other components within normal limits  I-STAT ARTERIAL BLOOD GAS, ED - Abnormal; Notable for the following:    pO2, Arterial 64.0 (*)    All other components within normal limits  I-STAT TROPOININ, ED    EKG  EKG Interpretation  Date/Time:  Tuesday May 10 2017 16:07:38 EDT Ventricular Rate:  106 PR Interval:    QRS Duration: 102 QT Interval:  355 QTC Calculation: 444 R Axis:   55 Text Interpretation:  Sinus rhythm Artifact Paired ventricular premature complexes Baseline wander Poor data quality in current ECG precludes serial comparison Confirmed by Freida Busman  MD, ANTHONY ([id number]) on 05/10/2017 4:16:43 PM       Radiology Dg Chest Portable 1 View  Result Date: 05/10/2017 CLINICAL DATA:  Acute onset shortness of breath. Currently undergoing CPAP. Current history of CHF, diabetes and atrial fibrillation. Former smoker. EXAM: PORTABLE CHEST 1 VIEW COMPARISON:  04/14/2017, 03/23/2017 and earlier, including CTA chest 11/30/2016 and earlier. FINDINGS: Suboptimal inspiration accounts for crowded bronchovascular markings, especially in the bases, and accentuates the cardiac silhouette. Taking this into account, cardiac silhouette moderately enlarged, unchanged. Left subclavian single lead transvenous pacemaker intact in its visualized portion. Prominent  bronchovascular markings diffusely and moderate central peribronchial thickening, more so than on the examination 04/14/2017. Pulmonary  vascularity normal without evidence pulmonary edema. No visible pleural effusions. No confluent airspace consolidation. IMPRESSION: Moderate changes of acute bronchitis and/or asthma without focal airspace pneumonia. Stable cardiomegaly without pulmonary edema. Electronically Signed   By: Hulan Saas M.D.   On: 05/10/2017 16:34    Procedures Procedures (including critical care time)  Medications Ordered in ED Medications  carvedilol (COREG) tablet 6.25 mg (6.25 mg Oral Given 05/11/17 0817)  guaifenesin (ROBITUSSIN) 100 MG/5ML syrup 100 mg (not administered)  apixaban (ELIQUIS) tablet 5 mg (5 mg Oral Given 05/10/17 2207)  diphenhydrAMINE (BENADRYL) capsule 25 mg (not administered)  isosorbide dinitrate (ISORDIL) tablet 40 mg (40 mg Oral Given 05/10/17 2312)  spironolactone (ALDACTONE) tablet 25 mg (not administered)  finasteride (PROSCAR) tablet 5 mg (not administered)  HYDROcodone-acetaminophen (NORCO/VICODIN) 5-325 MG per tablet 1 tablet (1 tablet Oral Given 05/10/17 2312)  hydrALAZINE (APRESOLINE) tablet 25 mg (25 mg Oral Given 05/10/17 2312)  atorvastatin (LIPITOR) tablet 40 mg (not administered)  allopurinol (ZYLOPRIM) tablet 200 mg (200 mg Oral Given 05/10/17 2312)  tamsulosin (FLOMAX) capsule 0.4 mg (0.4 mg Oral Given 05/10/17 2311)  sodium chloride flush (NS) 0.9 % injection 3 mL (3 mLs Intravenous Given 05/10/17 2313)  acetaminophen (TYLENOL) tablet 650 mg (not administered)    Or  acetaminophen (TYLENOL) suppository 650 mg (not administered)  ondansetron (ZOFRAN) tablet 4 mg (not administered)    Or  ondansetron (ZOFRAN) injection 4 mg (not administered)  insulin aspart (novoLOG) injection 0-20 Units (7 Units Subcutaneous Given 05/11/17 0817)  insulin aspart (novoLOG) injection 0-5 Units (0 Units Subcutaneous Not Given 05/10/17 2248)  furosemide  (LASIX) injection 40 mg (40 mg Intravenous Given 05/10/17 2312)  potassium chloride SA (K-DUR,KLOR-CON) CR tablet 20 mEq (20 mEq Oral Given 05/10/17 2317)  predniSONE (DELTASONE) tablet 20 mg (20 mg Oral Given 05/11/17 0817)  ipratropium-albuterol (DUONEB) 0.5-2.5 (3) MG/3ML nebulizer solution 3 mL (3 mLs Nebulization Given 05/11/17 0832)  furosemide (LASIX) injection 60 mg (60 mg Intravenous Given 05/10/17 1711)  methylPREDNISolone sodium succinate (SOLU-MEDROL) 125 mg/2 mL injection 125 mg (125 mg Intravenous Given 05/10/17 1755)  ipratropium-albuterol (DUONEB) 0.5-2.5 (3) MG/3ML nebulizer solution 3 mL (3 mLs Nebulization Given 05/10/17 1859)  potassium chloride (K-DUR,KLOR-CON) CR tablet 30 mEq (30 mEq Oral Given 05/11/17 0502)     Initial Impression / Assessment and Plan / ED Course  I have reviewed the triage vital signs and the nursing notes.  Pertinent labs & imaging results that were available during my care of the patient were reviewed by me and considered in my medical decision making (see chart for details).     83 year old male history of recurrent admissions for acute on chronic CHF exacerbation patient, PE, A. fib on Eliquis, obesity, diabetes, hypertension who presents EMS for acute onset dyspnea several hours before arrival.  Patient reports being compliant with home medications of Lasix, spironolactone, and hydralazine.  He reports symptoms are similar to previous admissions for CHF exacerbation. Initially on CPAP by EMS, tachypneic and hypoxic when transferred to ED BiPAP machine. Denies other new changes. Hypertensive by EMS to 150s/90s.  AF, VSS. Lungs with diffuse crackles. Abdomen soft, benign throughout. Pt desats to low 80% when transferred from EMS CPAP to ED BiPAP machine.   BP on arrival 180s/100s. Initial clinical picture consistent with recurrent CHF exacerbation. Nitro gtt started.  CXR showing moderate changes of acute bronchitis and/or asthma without focal airspace  opacity. Pt denies history of COPD/asthma, not current tobacco user. Solumedrol and duonebs given.  Labs showing  WBC 11k, BNP 282 (consistent with previous admissions for CHF exacerbation). ABG showing pO2 of 64.   Suspect respiratory failure 2/2 mixed etiology of bronchitis and CHF exacerbation. Pt admitted for further management and evaluation. Pt stable at time of transfer.  Final Clinical Impressions(s) / ED Diagnoses   Final diagnoses:  Shortness of breath  Acute congestive heart failure, unspecified heart failure type (HCC)  Acute bronchitis, unspecified organism    New Prescriptions Current Discharge Medication List       Hebert Soho, MD 05/11/17 [phone]

## 2017-05-10 NOTE — ED Notes (Signed)
Pt taken off Bi pap by resp. Tech per MD order.  Pt tolerating well at this time

## 2017-05-10 NOTE — ED Notes (Signed)
Admitting doctor into assess pt for admission

## 2017-05-10 NOTE — ED Triage Notes (Signed)
Pt to ED via [facility] from home with c/o resp distress.  Pt resp rate on EMS arrival was 48.  Pt with rales and crackles through out.  Pt placed on C-pap and tolerated well.

## 2017-05-10 NOTE — H&P (Signed)
History and Physical    Ryan Macdonald MBT:[id number] DOB: [birth year] DOA: 05/10/2017  PCP: [facility]   Patient coming from: Home via EMS  Chief Complaint: Shortness of breath  HPI: Ryan Macdonald is a 83 y.o. 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  The patient reports progressive shortness of breat over a period of 30 minutes or so while at rest (he was watching TV).  He denies associated chest pain.  He has a cough, but it is nonproductive.  He denies sick contacts.  No fever.  No nausea and vomiting.  No new swelling. He checks his weights daily.  He was admitted in May for CHF exacerbation, and he feels that this presentation is similar.  ED Course: Patient was brought in by EMS.  He required CPAP for O2 sas in the low 80's en route  He was transitioned to BiPAP upon arrival here.  He was on a nitroglycerin drip briefly.  He received lasix 60mg  IV x one, duoneb, and solumerol 125mg  IV x one.  Chest xray shows moderate changes consistent with bronchitis and/or asthma; no pneumonia; stable cardiomegaly without pulmonary edema.  ABG showed pH 7.397, pCO2 43, pO2 64.  BNP 282.  Troponin 0.04.  Respiratory failure thought to be multifactorial.  Patient weaned from BiPAP in the ED.  Hospitalist asked to admit.  Review of Systems: As per HPI otherwise 10 systems reviewed and negative.   Past Medical History:  Diagnosis Date  . Cat scratch fever    removed mass on right side neck  . Cataracts, bilateral   . CHF (congestive heart failure) (HCC)   . Diabetes mellitus without complication (HCC)    TYPE 2  . High cholesterol   . Hypertension   . Kidney stones   . Morbid obesity (HCC)   . Nonischemic cardiomyopathy (HCC)    a. ? 2009 Cath in [place] - nl cors per  pt;  b. 09/2013 Echo: EF 25-30%, sev diff HK.  . Osteoarthritis   . PAF (paroxysmal atrial fibrillation) (HCC)    a. post-op hip in 2014 - prev on xarelto.  . Shortness of breath dyspnea     Past Surgical History:  Procedure Laterality Date  . CARDIAC CATHETERIZATION  1980's  . CIRCUMCISION  1940's  . CYSTOSCOPY WITH URETHRAL DILATATION  10/23/2013  . CYSTOSCOPY WITH URETHRAL DILATATION N/A 10/23/2013   Procedure: CYSTOSCOPY WITH URETHRAL DILATATION;  Surgeon: Kathi Ludwig, MD;  Location: [facility] OR;  Service: Urology;  Laterality: N/A;  . INCISION AND DRAINAGE OF WOUND Right 1980's   "cat scratch" (10/23/2013)  . INGUINAL HERNIA REPAIR Right 1950's  . KIDNEY STONE SURGERY Right 1970's; 1983   "twice"  . TONSILLECTOMY AND ADENOIDECTOMY  1940's  . TOTAL HIP ARTHROPLASTY Left 10/23/2013  . TOTAL HIP ARTHROPLASTY Left 10/23/2013   Procedure: TOTAL HIP ARTHROPLASTY;  Surgeon: Valeria Batman, MD;  Location: [facility] OR;  Service: Orthopedics;  Laterality: Left;     reports that he has quit smoking. His smoking use included Cigarettes and Cigars. He has a 15.00 pack-year smoking history. He has quit using smokeless tobacco. He reports that he does not drink alcohol or use drugs.  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  Allergies  Allergen Reactions  .  Lisinopril Swelling    Family History  Problem Relation Age of Onset  . Heart Problems Maternal Grandmother   . Other Unknown        negative for premature CAD     Prior to Admission medications   Medication Sig Start Date End Date Taking? Authorizing Provider  allopurinol (ZYLOPRIM) 100 MG tablet Take 200 mg by mouth daily.    [provider]  apixaban (ELIQUIS) 5 MG TABS tablet Take 1 tablet (5 mg total) by mouth 2 (two) times daily. 02/06/17   Amin, Loura Halt, MD  carvedilol (COREG) 12.5 MG tablet Take 0.5 tablets (6.25 mg total) by mouth 2 (two) times daily with a meal. 04/15/17   Osvaldo Shipper, MD    cyanocobalamin 1000 MCG tablet Take 1,000 mcg by mouth daily.     [provider]  diphenhydrAMINE (BENADRYL) 25 mg capsule Take 25 mg by mouth at bedtime as needed for sleep.    [provider]  finasteride (PROSCAR) 5 MG tablet Take 5 mg by mouth daily.    [provider]  furosemide (LASIX) 20 MG tablet Take 3 tablets (60 mg total) by mouth 2 (two) times daily. 04/15/17   Osvaldo Shipper, MD  glipiZIDE (GLUCOTROL) 5 MG tablet Take 5 mg by mouth daily before breakfast.    [provider]  guaifenesin (ROBITUSSIN) 100 MG/5ML syrup Take 100 mg by mouth 3 (three) times daily as needed for cough.    [provider]  hydrALAZINE (APRESOLINE) 25 MG tablet Take 25 mg by mouth 3 (three) times daily.    [provider]  HYDROcodone-acetaminophen (NORCO/VICODIN) 5-325 MG tablet Take 2 tablets by mouth every 4 (four) hours as needed. Patient taking differently: Take 1 tablet by mouth 2 (two) times daily as needed (for back pain).  04/09/16   Isa Rankin, MD  isosorbide dinitrate (DILATRATE-SR) 40 MG CR capsule Take 40 mg by mouth 3 (three) times daily.    [provider]  ketoconazole (NIZORAL) 2 % shampoo Apply 1 application topically 2 (two) times a week.    [provider]  potassium chloride (K-DUR) 10 MEQ tablet Take 2 tablets (20 mEq total) by mouth daily. 04/15/17 04/20/17  Osvaldo Shipper, MD  sertraline (ZOLOFT) 25 MG tablet Take 50 mg by mouth daily as needed (for anxiety).     [provider]  simvastatin (ZOCOR) 80 MG tablet Take 40 mg by mouth daily.    [provider]  spironolactone (ALDACTONE) 25 MG tablet Take 25 mg by mouth daily.    [provider]  tamsulosin (FLOMAX) 0.4 MG CAPS capsule Take 0.4 mg by mouth at bedtime.    [provider]    Physical Exam: Vitals:   05/10/17 2015 05/10/17 2030 05/10/17 2045 05/10/17 2052  BP: 126/78 124/72 130/69   Pulse: (!) 58 68 71   Resp:  20 18 20    Temp:      TempSrc:      SpO2: 96% 99% 98% 98%  Weight:      Height:          Constitutional: NAD, calm, comfortable on BiPAP.  Asking for something to drink. Vitals:   05/10/17 2015 05/10/17 2030 05/10/17 2045 05/10/17 2052  BP: 126/78 124/72 130/69   Pulse: (!) 58 68 71   Resp: 20 18 20    Temp:      TempSrc:      SpO2: 96% 99% 98% 98%  Weight:  Height:       Eyes: PERRL, lids and conjunctivae normal ENMT: Deferred because the patient is on BiPAP. Neck: normal appearance, supple, no masses Respiratory: Diminished bilaterally.  I do not hear wheezing or ronchi.  Normal respiratory effort. No accessory muscle use.  Cardiovascular: Normal rate, regular rhythm, no murmurs / rubs / gallops. Trace ankle edema. 2+ pedal pulses. GI: abdomen is super obese but soft and compressible.  No tenderness.  Bowel sounds are present. Musculoskeletal:  No joint deformity in upper and lower extremities. Good ROM, no contractures. Normal muscle tone.  Skin: no rashes, warm and dry Neurologic: No apparent focal deficits.   Psychiatric: Normal judgment and insight. Alert and oriented x 3. Normal mood.     Labs on Admission: I have personally reviewed following labs and imaging studies  CBC:  Recent Labs Lab 05/10/17 1653  WBC 11.0*  NEUTROABS 9.0*  HGB 12.2*  HCT 38.0*  MCV 94.3  PLT 223   Basic Metabolic Panel:  Recent Labs Lab 05/10/17 1653  NA 142  K 4.1  CL 105  CO2 29  GLUCOSE 192*  BUN 19  CREATININE 1.19  CALCIUM 9.0   GFR: Estimated Creatinine Clearance: 59.8 mL/min (by C-G formula based on SCr of 1.19 mg/dL).  Urine analysis:    Component Value Date/Time   COLORURINE YELLOW 05/20/2016 0301   APPEARANCEUR CLEAR 05/20/2016 0301   LABSPEC 1.012 05/20/2016 0301   PHURINE 5.0 05/20/2016 0301   GLUCOSEU NEGATIVE 05/20/2016 0301   HGBUR TRACE (A) 05/20/2016 0301   BILIRUBINUR NEGATIVE 05/20/2016 0301   KETONESUR NEGATIVE 05/20/2016 0301    PROTEINUR NEGATIVE 05/20/2016 0301   UROBILINOGEN 0.2 05/26/2015 1521   NITRITE POSITIVE (A) 05/20/2016 0301   LEUKOCYTESUR MODERATE (A) 05/20/2016 0301    Radiological Exams on Admission: Dg Chest Portable 1 View  Result Date: 05/10/2017 CLINICAL DATA:  Acute onset shortness of breath. Currently undergoing CPAP. Current history of CHF, diabetes and atrial fibrillation. Former smoker. EXAM: PORTABLE CHEST 1 VIEW COMPARISON:  04/14/2017, 03/23/2017 and earlier, including CTA chest 11/30/2016 and earlier. FINDINGS: Suboptimal inspiration accounts for crowded bronchovascular markings, especially in the bases, and accentuates the cardiac silhouette. Taking this into account, cardiac silhouette moderately enlarged, unchanged. Left subclavian single lead transvenous pacemaker intact in its visualized portion. Prominent bronchovascular markings diffusely and moderate central peribronchial thickening, more so than on the examination 04/14/2017. Pulmonary vascularity normal without evidence pulmonary edema. No visible pleural effusions. No confluent airspace consolidation. IMPRESSION: Moderate changes of acute bronchitis and/or asthma without focal airspace pneumonia. Stable cardiomegaly without pulmonary edema. Electronically Signed   By: Hulan Saas M.D.   On: 05/10/2017 16:34    EKG: Independently reviewed. NSR.  No acute ST segment changes.  Assessment/Plan Principal Problem:   Acute respiratory failure with hypoxia (HCC) Active Problems:   Nonischemic cardiomyopathy (HCC)   Obesity (BMI 30-39.9)   Essential hypertension   Hyperlipidemia   Atrial fibrillation (HCC)   CKD (chronic kidney disease), stage III   ICD (implantable cardioverter-defibrillator) in place   Acute on chronic combined systolic and diastolic CHF (congestive heart failure) (HCC)   Diabetes mellitus with complication (HCC)   Acute bronchitis      Acute hypoxic respiratory failure secondary to CHF exacerbation and  acute bronchitis --Wean oxygen as tolerated --lasix 40mg  IV q12h --Prednisone 20mg  daily --duoneb TID --Incentive spirometry  Acute on chronic combined CHF --Continue coreg, isordil, hydralazine, spironolactone  PAF --Xarelto  BPH --Proscar, flomax  DM --SSI AC/HS (hold  oral meds)     DVT prophylaxis: Anticoagulated with Xarelto Code Status: FULL Family Communication: Wife present in the ED at time of admission. Disposition Plan: Expect he will go home at discharge. Consults called: NONE Admission status: Place in observation with telemetry monitoring.   TIME SPENT: 60 minutes   Jerene Bears MD [facility] Pager [phone]  If 7PM-7AM, please contact night-coverage [email or web address] Password [facility]  05/10/2017, 9:34 PM

## 2017-05-10 NOTE — ED Notes (Signed)
Wife at bedside.  2 yellow colored necklaces one with a charm and 1 watch, 1 denture given to pt's wife.

## 2017-05-10 NOTE — ED Notes (Signed)
Pt resting with no complaints,. Tolerating Bi-pap without any problems

## 2017-05-11 DIAGNOSIS — N183 Chronic kidney disease, stage 3 (moderate): Secondary | ICD-10-CM

## 2017-05-11 DIAGNOSIS — Z96642 Presence of left artificial hip joint: Secondary | ICD-10-CM | POA: Diagnosis present

## 2017-05-11 DIAGNOSIS — E118 Type 2 diabetes mellitus with unspecified complications: Secondary | ICD-10-CM

## 2017-05-11 DIAGNOSIS — I428 Other cardiomyopathies: Secondary | ICD-10-CM | POA: Diagnosis present

## 2017-05-11 DIAGNOSIS — J209 Acute bronchitis, unspecified: Secondary | ICD-10-CM | POA: Diagnosis present

## 2017-05-11 DIAGNOSIS — Z95810 Presence of automatic (implantable) cardiac defibrillator: Secondary | ICD-10-CM | POA: Diagnosis not present

## 2017-05-11 DIAGNOSIS — I480 Paroxysmal atrial fibrillation: Secondary | ICD-10-CM | POA: Diagnosis present

## 2017-05-11 DIAGNOSIS — I472 Ventricular tachycardia: Secondary | ICD-10-CM | POA: Diagnosis not present

## 2017-05-11 DIAGNOSIS — E785 Hyperlipidemia, unspecified: Secondary | ICD-10-CM | POA: Diagnosis present

## 2017-05-11 DIAGNOSIS — I509 Heart failure, unspecified: Secondary | ICD-10-CM | POA: Diagnosis not present

## 2017-05-11 DIAGNOSIS — Z7984 Long term (current) use of oral hypoglycemic drugs: Secondary | ICD-10-CM | POA: Diagnosis not present

## 2017-05-11 DIAGNOSIS — Z87891 Personal history of nicotine dependence: Secondary | ICD-10-CM | POA: Diagnosis not present

## 2017-05-11 DIAGNOSIS — I5043 Acute on chronic combined systolic (congestive) and diastolic (congestive) heart failure: Secondary | ICD-10-CM | POA: Diagnosis present

## 2017-05-11 DIAGNOSIS — E876 Hypokalemia: Secondary | ICD-10-CM | POA: Diagnosis not present

## 2017-05-11 DIAGNOSIS — J9601 Acute respiratory failure with hypoxia: Secondary | ICD-10-CM | POA: Diagnosis present

## 2017-05-11 DIAGNOSIS — I130 Hypertensive heart and chronic kidney disease with heart failure and stage 1 through stage 4 chronic kidney disease, or unspecified chronic kidney disease: Secondary | ICD-10-CM | POA: Diagnosis present

## 2017-05-11 DIAGNOSIS — R0602 Shortness of breath: Secondary | ICD-10-CM | POA: Diagnosis present

## 2017-05-11 DIAGNOSIS — Z7901 Long term (current) use of anticoagulants: Secondary | ICD-10-CM | POA: Diagnosis not present

## 2017-05-11 DIAGNOSIS — Z79899 Other long term (current) drug therapy: Secondary | ICD-10-CM | POA: Diagnosis not present

## 2017-05-11 DIAGNOSIS — Z86711 Personal history of pulmonary embolism: Secondary | ICD-10-CM | POA: Diagnosis not present

## 2017-05-11 DIAGNOSIS — N400 Enlarged prostate without lower urinary tract symptoms: Secondary | ICD-10-CM | POA: Diagnosis present

## 2017-05-11 DIAGNOSIS — M109 Gout, unspecified: Secondary | ICD-10-CM | POA: Diagnosis present

## 2017-05-11 DIAGNOSIS — Z6839 Body mass index (BMI) 39.0-39.9, adult: Secondary | ICD-10-CM | POA: Diagnosis not present

## 2017-05-11 DIAGNOSIS — E7800 Pure hypercholesterolemia, unspecified: Secondary | ICD-10-CM | POA: Diagnosis present

## 2017-05-11 DIAGNOSIS — E1122 Type 2 diabetes mellitus with diabetic chronic kidney disease: Secondary | ICD-10-CM | POA: Diagnosis present

## 2017-05-11 LAB — GLUCOSE, CAPILLARY
GLUCOSE-CAPILLARY: 212 mg/dL — AB (ref 65–99)
GLUCOSE-CAPILLARY: 133 mg/dL — AB (ref 65–99)
Glucose-Capillary: 198 mg/dL — ABNORMAL HIGH (ref 65–99)
Glucose-Capillary: 180 mg/dL — ABNORMAL HIGH (ref 65–99)

## 2017-05-11 LAB — BASIC METABOLIC PANEL
Anion gap: 13 (ref 5–15)
BUN: 22 mg/dL — AB (ref 6–20)
CO2: 25 mmol/L (ref 22–32)
Calcium: 8.4 mg/dL — ABNORMAL LOW (ref 8.9–10.3)
Chloride: 101 mmol/L (ref 101–111)
Creatinine, Ser: 1.52 mg/dL — ABNORMAL HIGH (ref 0.61–1.24)
GFR calc Af Amer: 48 mL/min — ABNORMAL LOW (ref >60)
GFR, EST NON AFRICAN AMERICAN: 42 mL/min — AB (ref >60)
GLUCOSE: 339 mg/dL — AB (ref 65–99)
POTASSIUM: 3 mmol/L — AB (ref 3.5–5.1)
SODIUM: 139 mmol/L (ref 135–145)

## 2017-05-11 LAB — MAGNESIUM: Magnesium: 1.4 mg/dL — ABNORMAL LOW (ref 1.7–2.4)

## 2017-05-11 MED ORDER — FUROSEMIDE 40 MG PO TABS
60.0000 mg | ORAL_TABLET | Freq: Two times a day (BID) | ORAL | Status: DC
  Administered 2017-05-11 – 2017-05-14 (×6): 60 mg via ORAL
  Filled 2017-05-11 (×6): qty 1

## 2017-05-11 MED ORDER — POTASSIUM CHLORIDE CRYS ER 20 MEQ PO TBCR
30.0000 meq | EXTENDED_RELEASE_TABLET | Freq: Once | ORAL | Status: AC
  Administered 2017-05-11: 30 meq via ORAL
  Filled 2017-05-11: qty 1

## 2017-05-11 MED ORDER — INSULIN GLARGINE 100 UNIT/ML ~~LOC~~ SOLN
5.0000 [IU] | Freq: Every day | SUBCUTANEOUS | Status: DC
  Administered 2017-05-11 – 2017-05-13 (×3): 5 [IU] via SUBCUTANEOUS
  Filled 2017-05-11 (×4): qty 0.05

## 2017-05-11 MED ORDER — IPRATROPIUM-ALBUTEROL 0.5-2.5 (3) MG/3ML IN SOLN
3.0000 mL | Freq: Two times a day (BID) | RESPIRATORY_TRACT | Status: DC
  Administered 2017-05-11 – 2017-05-12 (×3): 3 mL via RESPIRATORY_TRACT
  Filled 2017-05-11 (×3): qty 3

## 2017-05-11 MED ORDER — POTASSIUM CHLORIDE ER 10 MEQ PO TBCR: 20.0000 meq | EXTENDED_RELEASE_TABLET | Freq: Every day | ORAL | Status: DC

## 2017-05-11 NOTE — Progress Notes (Signed)
Pt's K=3.0 this am, Schorr, NP notified and ordered additional K this morning. When in the room administering K, pt had a 8 beat run of VTACH. Pt denied any CP, and VSS. Schorr notified. Will continue to monitor pt.

## 2017-05-11 NOTE — Progress Notes (Signed)
Pt arrived to unit via stretcher escorted by ED, RN. Pt transferred himself from stretcher to bed. Pt arrived with 2 intact PIV, [place] with 4L of O2 and paper chart that was given to front desk. Pt denied any CP, HA, nausea or dizziness. Pt did have some DOE but O2 sat was >96%. Pt was informed of the safety policy and verbalized understanding to call for any assistance and correctly exhibited how to use the call light system. Pt's VSS and will continue to monitor.

## 2017-05-11 NOTE — Progress Notes (Addendum)
PROGRESS NOTE    Ryan Macdonald  WUJ:[id number] DOB: 1934-09-27 DOA: 05/10/2017 PCP: [facility]   Outpatient Specialists:     Brief Narrative:  Ryan Macdonald is a 83 y.o. 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  The patient reports progressive shortness of breat over a period of 30 minutes or so while at rest (he was watching TV).  He denies associated chest pain.  He has a cough, but it is nonproductive.  He denies sick contacts.  No fever.  No nausea and vomiting.  No new swelling. He checks his weights daily.  He was admitted in May for CHF exacerbation, and he feels that this presentation is similar.   Assessment & Plan:   Principal Problem:   Acute respiratory failure with hypoxia (HCC) Active Problems:   Nonischemic cardiomyopathy (HCC)   Obesity (BMI 30-39.9)   Essential hypertension   Hyperlipidemia   Atrial fibrillation (HCC)   CKD (chronic kidney disease), stage III   ICD (implantable cardioverter-defibrillator) in place   Acute on chronic combined systolic and diastolic CHF (congestive heart failure) (HCC)   Diabetes mellitus with complication (HCC)   Acute bronchitis   Acute hypoxic respiratory failure secondary to acute bronchitis --still on 2L-- wean O2 and check while ambulating --lasix 40mg  IV q12h-- d/c as Cr increasing --Prednisone 20mg  daily --duoneb TID --Incentive spirometry  Hypokalemia --repleat and check Mg -BMP in AM  Acute on chronic combined CHF --Continue coreg, isordil, hydralazine, spironolactone  PAF --eliquis  BPH --Proscar, flomax  CKD stage III -baseline  Is 1.3  DM --SSI AC/HS (hold oral meds)   DVT prophylaxis:  Fully anticoagulated  Code Status: Full Code   Family  Communication:   Disposition Plan:     Consultants:      Subjective: Still with SOB and on 2L O2  Objective: Vitals:   05/10/17 2242 05/10/17 2257 05/11/17 0500 05/11/17 0833  BP: (!) 146/93  110/66   Pulse: 92  94   Resp:      Temp: 98.6 F (37 C)  98.9 F (37.2 C)   TempSrc: Oral  Oral   SpO2: 95% 95% 97% 96%  Weight:      Height:        Intake/Output Summary (Last 24 hours) at 05/11/17 1411 Last data filed at 05/11/17 1327  Gross per 24 hour  Intake             1110 ml  Output             1750 ml  Net             -640 ml   Filed Weights   05/10/17 1610  Weight: 114.3 kg (252 lb)    Examination:  General exam: Appears calm and comfortable  Respiratory system: diminished, no wheezing Cardiovascular system: S1 & S2 heard, RRR. No JVD, murmurs, rubs, gallops or clicks. No pedal edema. Gastrointestinal system: Abdomen is nondistended, soft and nontender. No organomegaly or masses felt. Normal bowel sounds heard. Central nervous system: Alert and oriented. No focal neurological deficits. Extremities: Symmetric 5 x 5 power. Skin: No rashes, lesions or ulcers Psychiatry: Judgement and insight appear normal. Mood & affect appropriate.     Data Reviewed: I have personally reviewed following labs and imaging  studies  CBC:  Recent Labs Lab 05/10/17 1653  WBC 11.0*  NEUTROABS 9.0*  HGB 12.2*  HCT 38.0*  MCV 94.3  PLT 223   Basic Metabolic Panel:  Recent Labs Lab 05/10/17 1653 05/11/17 0257  NA 142 139  K 4.1 3.0*  CL 105 101  CO2 29 25  GLUCOSE 192* 339*  BUN 19 22*  CREATININE 1.19 1.52*  CALCIUM 9.0 8.4*   GFR: Estimated Creatinine Clearance: 46.8 mL/min (A) (by C-G formula based on SCr of 1.52 mg/dL (H)). Liver Function Tests: No results for input(s): AST, ALT, ALKPHOS, BILITOT, PROT, ALBUMIN in the last 168 hours. No results for input(s): LIPASE, AMYLASE in the last 168 hours. No results for input(s): AMMONIA in the last 168  hours. Coagulation Profile: No results for input(s): INR, PROTIME in the last 168 hours. Cardiac Enzymes: No results for input(s): CKTOTAL, CKMB, CKMBINDEX, TROPONINI in the last 168 hours. BNP (last 3 results) No results for input(s): PROBNP in the last 8760 hours. HbA1C: No results for input(s): HGBA1C in the last 72 hours. CBG:  Recent Labs Lab 05/10/17 2236 05/11/17 0730 05/11/17 1130  GLUCAP 163* 212* 198*   Lipid Profile: No results for input(s): CHOL, HDL, LDLCALC, TRIG, CHOLHDL, LDLDIRECT in the last 72 hours. Thyroid Function Tests: No results for input(s): TSH, T4TOTAL, FREET4, T3FREE, THYROIDAB in the last 72 hours. Anemia Panel: No results for input(s): VITAMINB12, FOLATE, FERRITIN, TIBC, IRON, RETICCTPCT in the last 72 hours. Urine analysis:    Component Value Date/Time   COLORURINE YELLOW 05/20/2016 0301   APPEARANCEUR CLEAR 05/20/2016 0301   LABSPEC 1.012 05/20/2016 0301   PHURINE 5.0 05/20/2016 0301   GLUCOSEU NEGATIVE 05/20/2016 0301   HGBUR TRACE (A) 05/20/2016 0301   BILIRUBINUR NEGATIVE 05/20/2016 0301   KETONESUR NEGATIVE 05/20/2016 0301   PROTEINUR NEGATIVE 05/20/2016 0301   UROBILINOGEN 0.2 05/26/2015 1521   NITRITE POSITIVE (A) 05/20/2016 0301   LEUKOCYTESUR MODERATE (A) 05/20/2016 0301     )No results found for this or any previous visit (from the past 240 hour(s)).    Anti-infectives    None       Radiology Studies: Dg Chest Portable 1 View  Result Date: 05/10/2017 CLINICAL DATA:  Acute onset shortness of breath. Currently undergoing CPAP. Current history of CHF, diabetes and atrial fibrillation. Former smoker. EXAM: PORTABLE CHEST 1 VIEW COMPARISON:  04/14/2017, 03/23/2017 and earlier, including CTA chest 11/30/2016 and earlier. FINDINGS: Suboptimal inspiration accounts for crowded bronchovascular markings, especially in the bases, and accentuates the cardiac silhouette. Taking this into account, cardiac silhouette moderately enlarged,  unchanged. Left subclavian single lead transvenous pacemaker intact in its visualized portion. Prominent bronchovascular markings diffusely and moderate central peribronchial thickening, more so than on the examination 04/14/2017. Pulmonary vascularity normal without evidence pulmonary edema. No visible pleural effusions. No confluent airspace consolidation. IMPRESSION: Moderate changes of acute bronchitis and/or asthma without focal airspace pneumonia. Stable cardiomegaly without pulmonary edema. Electronically Signed   By: Hulan Saas M.D.   On: 05/10/2017 16:34        Scheduled Meds: . allopurinol  200 mg Oral Daily  . apixaban  5 mg Oral BID  . atorvastatin  40 mg Oral q1800  . carvedilol  6.25 mg Oral BID WC  . finasteride  5 mg Oral Daily  . furosemide  40 mg Intravenous Q12H  . hydrALAZINE  25 mg Oral TID  . insulin aspart  0-20 Units Subcutaneous TID WC  . insulin aspart  0-5 Units Subcutaneous  QHS  . ipratropium-albuterol  3 mL Nebulization BID  . isosorbide dinitrate  40 mg Oral TID  . potassium chloride  20 mEq Oral Daily  . predniSONE  20 mg Oral Q breakfast  . sodium chloride flush  3 mL Intravenous Q12H  . spironolactone  25 mg Oral Daily  . tamsulosin  0.4 mg Oral QHS   Continuous Infusions:   LOS: 0 days    Time spent: 25 min    Ryan Didion U Raylen Tangonan, DO [facility] Pager [phone]  If 7PM-7AM, please contact night-coverage [email or web address] Password [facility] 05/11/2017, 2:11 PM

## 2017-05-11 NOTE — Plan of Care (Signed)
Problem: Education: Goal: Knowledge of [place] [facility] Education information/materials will improve Outcome: Progressing Reviewed medications with pt today.

## 2017-05-11 NOTE — Care Management Note (Signed)
Case Management Note  Patient Details  Name: Isabella Leathem MRN: [id number] Date of Birth: 1933/12/19  Subjective/Objective: Pt presented for Acute Respiratory Failure. Pt is from home with support of wife. CM will assess to see if need home 02.  Pt has PCP Dr. Cleon Dew at the [facility]. Fax number to office is [phone]. Pt does not use a local pharmacy-gets all medications from the [place].                    Action/Plan: Rx's will need to be faxed once pt is stable for d/c. CM will continue to monitor for additional needs.   Expected Discharge Date:                  Expected Discharge Plan:  Home/Self Care  In-House Referral:  NA  Discharge planning Services  CM Consult  Post Acute Care Choice:  NA Choice offered to:  NA  DME Arranged:  N/A DME Agency:  NA  HH Arranged:  NA HH Agency:  NA  Status of Service:  Completed, signed off  If discussed at Long Length of Stay Meetings, dates discussed:    Additional Comments:  Gala Lewandowsky, RN 05/11/2017, 5:01 PM

## 2017-05-11 NOTE — Progress Notes (Signed)
SATURATION QUALIFICATIONS: (This note is used to comply with regulatory documentation for home oxygen)  Patient Saturations on Room Air at Rest = 78%  Patient Saturations on Room Air while Ambulating = %  Patient Saturations on 1 Liters of oxygen while Ambulating = 92%  Please briefly explain why patient needs home oxygen:  Mobility tech, Ryan Macdonald ambulated pt. He removed pt's O2 before walking, he reported his sats dropped into the 70's, 78%, he reapplied O2 at 1L before going to walk to get sats back up so pt could tolerate ambulating.

## 2017-05-11 NOTE — Care Management Obs Status (Signed)
MEDICARE OBSERVATION STATUS NOTIFICATION   Patient Details  Name: Ryan Macdonald MRN: [id number] Date of Birth: [birth year]   Medicare Observation Status Notification Given:  Yes    Gala Lewandowsky, RN 05/11/2017, 4:58 PM

## 2017-05-12 ENCOUNTER — Inpatient Hospital Stay (HOSPITAL_COMMUNITY): Payer: Medicare Other

## 2017-05-12 LAB — CBC
HEMATOCRIT: 32.5 % — AB (ref 39.0–52.0)
Hemoglobin: 10.3 g/dL — ABNORMAL LOW (ref 13.0–17.0)
MCH: 30 pg (ref 26.0–34.0)
MCHC: 31.7 g/dL (ref 30.0–36.0)
MCV: 94.8 fL (ref 78.0–100.0)
PLATELETS: 225 10*3/uL (ref 150–400)
RBC: 3.43 MIL/uL — ABNORMAL LOW (ref 4.22–5.81)
RDW: 14.2 % (ref 11.5–15.5)
WBC: 12.4 10*3/uL — AB (ref 4.0–10.5)

## 2017-05-12 LAB — BASIC METABOLIC PANEL
Anion gap: 8 (ref 5–15)
BUN: 27 mg/dL — AB (ref 6–20)
CALCIUM: 8.9 mg/dL (ref 8.9–10.3)
CO2: 29 mmol/L (ref 22–32)
CREATININE: 1.36 mg/dL — AB (ref 0.61–1.24)
Chloride: 102 mmol/L (ref 101–111)
GFR calc Af Amer: 55 mL/min — ABNORMAL LOW (ref >60)
GFR, EST NON AFRICAN AMERICAN: 48 mL/min — AB (ref >60)
Glucose, Bld: 132 mg/dL — ABNORMAL HIGH (ref 65–99)
POTASSIUM: 3.5 mmol/L (ref 3.5–5.1)
SODIUM: 139 mmol/L (ref 135–145)

## 2017-05-12 LAB — GLUCOSE, CAPILLARY
GLUCOSE-CAPILLARY: 112 mg/dL — AB (ref 65–99)
Glucose-Capillary: 154 mg/dL — ABNORMAL HIGH (ref 65–99)
Glucose-Capillary: 193 mg/dL — ABNORMAL HIGH (ref 65–99)
Glucose-Capillary: 172 mg/dL — ABNORMAL HIGH (ref 65–99)

## 2017-05-12 MED ORDER — MAGNESIUM SULFATE 2 GM/50ML IV SOLN
2.0000 g | Freq: Once | INTRAVENOUS | Status: AC
  Administered 2017-05-12: 2 g via INTRAVENOUS
  Filled 2017-05-12: qty 50

## 2017-05-12 NOTE — Discharge Instructions (Signed)

## 2017-05-12 NOTE — Progress Notes (Addendum)
PROGRESS NOTE    Ryan Macdonald  ZOX:[id number] DOB: 1934/07/01 DOA: 05/10/2017 PCP: [facility]   Outpatient Specialists:     Brief Narrative:  Ryan Macdonald is a 83 y.o. 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  The patient reports progressive shortness of breat over a period of 30 minutes or so while at rest (he was watching TV).  He denies associated chest pain.  He has a cough, but it is nonproductive.  He denies sick contacts.  No fever.  No nausea and vomiting.  No new swelling. He checks his weights daily.  He was admitted in May for CHF exacerbation, and he feels that this presentation is similar.   Assessment & Plan:   Principal Problem:   Acute respiratory failure with hypoxia (HCC) Active Problems:   Nonischemic cardiomyopathy (HCC)   Obesity (BMI 30-39.9)   Essential hypertension   Hyperlipidemia   Atrial fibrillation (HCC)   CKD (chronic kidney disease), stage III   ICD (implantable cardioverter-defibrillator) in place   Diabetes mellitus with complication (HCC)   Acute bronchitis   Acute hypoxic respiratory failure secondary to acute bronchitis +/- acute CHF exacerbation --02 weaned off at rest but still drops while ambulating --lasix changed to PO as CR was increasing on IV --Prednisone 20mg  daily --duoneb BID --Incentive spirometry -will check dg chest as more right sided wheezing this AM  Hypokalemia --repleated -Mg low and this too has been replaced -BMP in AM  Acute on chronic combined CHF --Continue coreg, isordil, hydralazine, spironolactone  PAF --eliquis  BPH --Proscar, flomax  CKD stage III -baseline  Is 1.3  DM --SSI AC/HS (hold oral meds)  Short run of v tach -Mg low-- replace -on BB  Patient  gets meds/DME supplies through [place]-- fax number for d/c summary and PCP is [phone] (Dr. Cleon Dew at [facility])  DVT prophylaxis:  Fully anticoagulated  Code Status: Full Code   Family Communication:   Disposition Plan:     Consultants:      Subjective: Breathing continues to improve but not back to baseline but able to talk with out interruptions   Objective: Vitals:   05/11/17 2150 05/12/17 0427 05/12/17 0753 05/12/17 0835  BP: 124/76 112/71 108/75   Pulse:  64 (!) 54   Resp:  19    Temp:  97.9 F (36.6 C)    TempSrc:  Oral    SpO2:   92% 93%  Weight:  117.5 kg (259 lb 0.7 oz)    Height:        Intake/Output Summary (Last 24 hours) at 05/12/17 1221 Last data filed at 05/12/17 0842  Gross per 24 hour  Intake              890 ml  Output             1650 ml  Net             -760 ml   Filed Weights   05/10/17 1610 05/11/17 2100 05/12/17 0427  Weight: 114.3 kg (252 lb) 118.9 kg (262 lb 1.6 oz) 117.5 kg (259 lb 0.7 oz)    Examination:  General exam: off O2, in bed, No increased work of breathing Respiratory system: right sided wheezing Cardiovascular system: rrr, min LE edema Gastrointestinal  system: +BS, soft Central nervous system: A+Ox3, NAD Extremities: Symmetric 5 x 5 power. Skin: No rashes, lesions or ulcers Psychiatry: Judgement and insight appear normal. Mood & affect appropriate.     Data Reviewed: I have personally reviewed following labs and imaging studies  CBC:  Recent Labs Lab 05/10/17 1653 05/12/17 0408  WBC 11.0* 12.4*  NEUTROABS 9.0*  --   HGB 12.2* 10.3*  HCT 38.0* 32.5*  MCV 94.3 94.8  PLT 223 225   Basic Metabolic Panel:  Recent Labs Lab 05/10/17 1653 05/11/17 0257 05/11/17 1444 05/12/17 0408  NA 142 139  --  139  K 4.1 3.0*  --  3.5  CL 105 101  --  102  CO2 29 25  --  29  GLUCOSE 192* 339*  --  132*  BUN 19 22*  --  27*  CREATININE 1.19 1.52*  --  1.36*  CALCIUM 9.0 8.4*  --  8.9  MG  --   --   1.4*  --    GFR: Estimated Creatinine Clearance: 53.1 mL/min (A) (by C-G formula based on SCr of 1.36 mg/dL (H)). Liver Function Tests: No results for input(s): AST, ALT, ALKPHOS, BILITOT, PROT, ALBUMIN in the last 168 hours. No results for input(s): LIPASE, AMYLASE in the last 168 hours. No results for input(s): AMMONIA in the last 168 hours. Coagulation Profile: No results for input(s): INR, PROTIME in the last 168 hours. Cardiac Enzymes: No results for input(s): CKTOTAL, CKMB, CKMBINDEX, TROPONINI in the last 168 hours. BNP (last 3 results) No results for input(s): PROBNP in the last 8760 hours. HbA1C: No results for input(s): HGBA1C in the last 72 hours. CBG:  Recent Labs Lab 05/11/17 1130 05/11/17 1628 05/11/17 2140 05/12/17 0739 05/12/17 1122  GLUCAP 198* 180* 133* 112* 154*   Lipid Profile: No results for input(s): CHOL, HDL, LDLCALC, TRIG, CHOLHDL, LDLDIRECT in the last 72 hours. Thyroid Function Tests: No results for input(s): TSH, T4TOTAL, FREET4, T3FREE, THYROIDAB in the last 72 hours. Anemia Panel: No results for input(s): VITAMINB12, FOLATE, FERRITIN, TIBC, IRON, RETICCTPCT in the last 72 hours. Urine analysis:    Component Value Date/Time   COLORURINE YELLOW 05/20/2016 0301   APPEARANCEUR CLEAR 05/20/2016 0301   LABSPEC 1.012 05/20/2016 0301   PHURINE 5.0 05/20/2016 0301   GLUCOSEU NEGATIVE 05/20/2016 0301   HGBUR TRACE (A) 05/20/2016 0301   BILIRUBINUR NEGATIVE 05/20/2016 0301   KETONESUR NEGATIVE 05/20/2016 0301   PROTEINUR NEGATIVE 05/20/2016 0301   UROBILINOGEN 0.2 05/26/2015 1521   NITRITE POSITIVE (A) 05/20/2016 0301   LEUKOCYTESUR MODERATE (A) 05/20/2016 0301     )No results found for this or any previous visit (from the past 240 hour(s)).    Anti-infectives    None       Radiology Studies: Dg Chest Portable 1 View  Result Date: 05/10/2017 CLINICAL DATA:  Acute onset shortness of breath. Currently undergoing CPAP. Current history of  CHF, diabetes and atrial fibrillation. Former smoker. EXAM: PORTABLE CHEST 1 VIEW COMPARISON:  04/14/2017, 03/23/2017 and earlier, including CTA chest 11/30/2016 and earlier. FINDINGS: Suboptimal inspiration accounts for crowded bronchovascular markings, especially in the bases, and accentuates the cardiac silhouette. Taking this into account, cardiac silhouette moderately enlarged, unchanged. Left subclavian single lead transvenous pacemaker intact in its visualized portion. Prominent bronchovascular markings diffusely and moderate central peribronchial thickening, more so than on the examination 04/14/2017. Pulmonary vascularity normal without evidence pulmonary edema. No visible pleural effusions. No confluent airspace consolidation. IMPRESSION: Moderate changes of acute bronchitis  and/or asthma without focal airspace pneumonia. Stable cardiomegaly without pulmonary edema. Electronically Signed   By: Hulan Saas M.D.   On: 05/10/2017 16:34        Scheduled Meds: . allopurinol  200 mg Oral Daily  . apixaban  5 mg Oral BID  . atorvastatin  40 mg Oral q1800  . carvedilol  6.25 mg Oral BID WC  . finasteride  5 mg Oral Daily  . furosemide  60 mg Oral BID  . hydrALAZINE  25 mg Oral TID  . insulin aspart  0-20 Units Subcutaneous TID WC  . insulin aspart  0-5 Units Subcutaneous QHS  . insulin glargine  5 Units Subcutaneous QHS  . ipratropium-albuterol  3 mL Nebulization BID  . isosorbide dinitrate  40 mg Oral TID  . potassium chloride  20 mEq Oral Daily  . predniSONE  20 mg Oral Q breakfast  . sodium chloride flush  3 mL Intravenous Q12H  . spironolactone  25 mg Oral Daily  . tamsulosin  0.4 mg Oral QHS   Continuous Infusions:   LOS: 1 day    Time spent: 25 min    JESSICA U VANN, DO [facility] Pager [phone]  If 7PM-7AM, please contact night-coverage [email or web address] Password [facility] 05/12/2017, 12:21 PM

## 2017-05-13 DIAGNOSIS — I509 Heart failure, unspecified: Secondary | ICD-10-CM

## 2017-05-13 LAB — CBC
HEMATOCRIT: 31.2 % — AB (ref 39.0–52.0)
HEMOGLOBIN: 10.4 g/dL — AB (ref 13.0–17.0)
MCH: 30.7 pg (ref 26.0–34.0)
MCHC: 33.3 g/dL (ref 30.0–36.0)
MCV: 92 fL (ref 78.0–100.0)
Platelets: 212 10*3/uL (ref 150–400)
RBC: 3.39 MIL/uL — ABNORMAL LOW (ref 4.22–5.81)
RDW: 13.7 % (ref 11.5–15.5)
WBC: 11.1 10*3/uL — AB (ref 4.0–10.5)

## 2017-05-13 LAB — BASIC METABOLIC PANEL
Anion gap: 7 (ref 5–15)
BUN: 29 mg/dL — ABNORMAL HIGH (ref 6–20)
CO2: 30 mmol/L (ref 22–32)
Calcium: 8.9 mg/dL (ref 8.9–10.3)
Chloride: 101 mmol/L (ref 101–111)
Creatinine, Ser: 1.29 mg/dL — ABNORMAL HIGH (ref 0.61–1.24)
GFR calc Af Amer: 59 mL/min — ABNORMAL LOW (ref >60)
GFR calc non Af Amer: 51 mL/min — ABNORMAL LOW (ref >60)
Glucose, Bld: 146 mg/dL — ABNORMAL HIGH (ref 65–99)
Potassium: 3.6 mmol/L (ref 3.5–5.1)
Sodium: 138 mmol/L (ref 135–145)

## 2017-05-13 LAB — GLUCOSE, CAPILLARY
GLUCOSE-CAPILLARY: 103 mg/dL — AB (ref 65–99)
GLUCOSE-CAPILLARY: 146 mg/dL — AB (ref 65–99)
GLUCOSE-CAPILLARY: 205 mg/dL — AB (ref 65–99)
Glucose-Capillary: 187 mg/dL — ABNORMAL HIGH (ref 65–99)

## 2017-05-13 LAB — MAGNESIUM: Magnesium: 1.9 mg/dL (ref 1.7–2.4)

## 2017-05-13 MED ORDER — IPRATROPIUM-ALBUTEROL 0.5-2.5 (3) MG/3ML IN SOLN: 3.0000 mL | RESPIRATORY_TRACT | Status: DC | PRN

## 2017-05-13 MED ORDER — HYDROCOD POLST-CPM POLST ER 10-8 MG/5ML PO SUER
5.0000 mL | Freq: Two times a day (BID) | ORAL | Status: DC | PRN
  Administered 2017-05-14: 5 mL via ORAL
  Filled 2017-05-13: qty 5

## 2017-05-13 NOTE — Care Management Note (Signed)
Case Management Note Original note created by Mable Fill   Patient Details  Name: Farhan Lehn MRN: [id number] Date of Birth: 1934-04-10  Subjective/Objective: Pt presented for Acute Respiratory Failure. Pt is from home with support of wife. CM will assess to see if need home 02.  Pt has PCP Dr. Cleon Dew at the [facility]. Fax number to office is [phone]. Pt does not use a local pharmacy-gets all medications from the [place].                    Action/Plan: Rx's will need to be faxed once pt is stable for d/c. CM will continue to monitor for additional needs.   Expected Discharge Date:                  Expected Discharge Plan:  Home/Self Care  In-House Referral:  NA  Discharge planning Services  CM Consult  Post Acute Care Choice:  NA Choice offered to:  NA  DME Arranged:  N/A DME Agency:  NA  HH Arranged:  NA HH Agency:  NA  Status of Service:  Completed, signed off  If discussed at Long Length of Stay Meetings, dates discussed:    Additional Comments: 05/13/2017 Pt from home with wife. Pt is completely independent - no assistance needed.  Pt weighs daily and states he adheres to low salt diet.   Cherylann Parr, RN 05/13/2017, 9:31 AM

## 2017-05-13 NOTE — Progress Notes (Signed)
Pt having frequent ectopy: 14 beat and 23 beat run of Vtach. Pt asymptomatic. Order requested for BMET with Mag. Lab results WNL. Will continue to monitor.

## 2017-05-13 NOTE — Progress Notes (Signed)
Patient ID: Ryan Macdonald, male   DOB: 12-25-1933, 83 y.o.   MRN: [id number]                                                                PROGRESS NOTE                                                                                                                                                                                                             Patient Demographics:    Ryan Macdonald, is a 83 y.o. male, DOB - 1934-04-09, VHQ:[id number]  Admit date - 05/10/2017   Admitting Physician Michael Litter, MD  Outpatient Primary MD for the patient is [facility]  LOS - 2  Outpatient Specialists:    Chief Complaint  Patient presents with  . Shortness of Breath  . 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 The patient reports progressive shortness of breat over a period of 30 minutes or so while at rest (he was watching TV). He denies associated chest pain. He has a cough, but it is nonproductive. He denies sick contacts. No fever. No nausea and vomiting. No new swelling. He checks his weights daily. He was admitted in May for CHF exacerbation, and he feels that this presentation is similar.   Subjective:    Ryan Macdonald today states that dyspnea improved,  Only about 75% normal.   No headache, No chest pain, No abdominal pain - No Nausea, No new weakness tingling or numbness, No Cough -   Assessment  & Plan :    Principal Problem:   Acute respiratory failure with hypoxia (HCC) Active Problems:   Nonischemic cardiomyopathy (HCC)   Obesity (BMI 30-39.9)   Essential hypertension   Hyperlipidemia   Atrial fibrillation (HCC)   CKD (chronic kidney disease), stage III   ICD (implantable  cardioverter-defibrillator) in place   Diabetes mellitus with complication (HCC)   Acute bronchitis    Acute hypoxic respiratory failure secondary to acute bronchitis +/- acute CHF exacerbation --02 weaned off  at rest but still drops while ambulating --lasix changed to PO as CR was increasing on IV --Prednisone 20mg  daily --duoneb BID --Incentive spirometry Monitor today, and if breathing stable, home tomorrow.    Cough tussionex prn  Hypokalemia --repleated -Mg low and this too has been replaced -BMP in AM  Acute on chronic combined CHF --Continue coreg, isordil, hydralazine, spironolactone  PAF --eliquis  BPH --Proscar, flomax  CKD stage III -baseline  Is 1.3  DM --SSI AC/HS (hold oral meds)  Short run of v tach -Mg low-- replace -on BB  Patient gets meds/DME supplies through [place]-- fax number for d/c summary and PCP is [phone] (Dr. Cleon Dew at [facility])  DVT prophylaxis:  Fully anticoagulated  Code Status: Full Code    Lab Results  Component Value Date   PLT 212 05/13/2017    Antibiotics  :  none  Anti-infectives    None        Objective:   Vitals:   05/12/17 1635 05/12/17 1900 05/12/17 2031 05/13/17 0425  BP: (!) 131/91 (!) 106/54  112/78  Pulse:  83  85  Resp: 14 17  16   Temp:    98.6 F (37 C)  TempSrc:    Oral  SpO2: 98% 100% 97% 98%  Weight:    116.4 kg (256 lb 11.2 oz)  Height:        Wt Readings from Last 3 Encounters:  05/13/17 116.4 kg (256 lb 11.2 oz)  04/15/17 114.4 kg (252 lb 3.2 oz)  03/25/17 117.5 kg (259 lb)     Intake/Output Summary (Last 24 hours) at 05/13/17 0819 Last data filed at 05/13/17 0721  Gross per 24 hour  Intake              320 ml  Output             1150 ml  Net             -830 ml     Physical Exam  Awake Alert, Oriented X 3, No new F.N deficits, Normal affect [place].AT,PERRAL Supple Neck,No JVD, No cervical lymphadenopathy appriciated.  Symmetrical Chest wall  movement, Good air movement bilaterally, CTAB RRR,No Gallops,Rubs or new Murmurs, No Parasternal Heave +ve B.Sounds, Abd Soft, No tenderness, No organomegaly appriciated, No rebound - guarding or rigidity. No Cyanosis, Clubbing or edema, No new Rash or bruise     Data Review:    CBC  Recent Labs Lab 05/10/17 1653 05/12/17 0408 05/13/17 0042  WBC 11.0* 12.4* 11.1*  HGB 12.2* 10.3* 10.4*  HCT 38.0* 32.5* 31.2*  PLT 223 225 212  MCV 94.3 94.8 92.0  MCH 30.3 30.0 30.7  MCHC 32.1 31.7 33.3  RDW 13.8 14.2 13.7  LYMPHSABS 1.1  --   --   MONOABS 0.7  --   --   EOSABS 0.2  --   --   BASOSABS 0.0  --   --     Chemistries   Recent Labs Lab 05/10/17 1653 05/11/17 0257 05/11/17 1444 05/12/17 0408 05/13/17 0042  NA 142 139  --  139 138  K 4.1 3.0*  --  3.5 3.6  CL 105 101  --  102 101  CO2 29 25  --  29 30  GLUCOSE 192* 339*  --  132* 146*  BUN 19 22*  --  27* 29*  CREATININE 1.19 1.52*  --  1.36* 1.29*  CALCIUM 9.0 8.4*  --  8.9 8.9  MG  --   --  1.4*  --  1.9   ------------------------------------------------------------------------------------------------------------------ No results for input(s): CHOL, HDL, LDLCALC, TRIG, CHOLHDL, LDLDIRECT in the last 72 hours.  Lab Results  Component Value Date   HGBA1C 6.3 (H) 02/03/2017   ------------------------------------------------------------------------------------------------------------------ No results for input(s): TSH, T4TOTAL, T3FREE, THYROIDAB in the last 72 hours.  Invalid input(s): FREET3 ------------------------------------------------------------------------------------------------------------------ No results for input(s): VITAMINB12, FOLATE, FERRITIN, TIBC, IRON, RETICCTPCT in the last 72 hours.  Coagulation profile No results for input(s): INR, PROTIME in the last 168 hours.  No results for input(s): DDIMER in the last 72 hours.  Cardiac Enzymes No results for input(s): CKMB, TROPONINI, MYOGLOBIN in  the last 168 hours.  Invalid input(s): CK ------------------------------------------------------------------------------------------------------------------    Component Value Date/Time   BNP 282.1 (H) 05/10/2017 1653    Inpatient Medications  Scheduled Meds: . allopurinol  200 mg Oral Daily  . apixaban  5 mg Oral BID  . atorvastatin  40 mg Oral q1800  . carvedilol  6.25 mg Oral BID WC  . finasteride  5 mg Oral Daily  . furosemide  60 mg Oral BID  . hydrALAZINE  25 mg Oral TID  . insulin aspart  0-20 Units Subcutaneous TID WC  . insulin aspart  0-5 Units Subcutaneous QHS  . insulin glargine  5 Units Subcutaneous QHS  . isosorbide dinitrate  40 mg Oral TID  . potassium chloride  20 mEq Oral Daily  . predniSONE  20 mg Oral Q breakfast  . sodium chloride flush  3 mL Intravenous Q12H  . spironolactone  25 mg Oral Daily  . tamsulosin  0.4 mg Oral QHS   Continuous Infusions: PRN Meds:.acetaminophen **OR** acetaminophen, diphenhydrAMINE, guaifenesin, HYDROcodone-acetaminophen, ipratropium-albuterol, ondansetron **OR** ondansetron (ZOFRAN) IV  Micro Results No results found for this or any previous visit (from the past 240 hour(s)).  Radiology Reports Dg Chest 2 View  Result Date: 05/12/2017 CLINICAL DATA:  Patient with history of wheezing. EXAM: CHEST  2 VIEW COMPARISON:  Chest radiograph 05/10/2017. FINDINGS: Single lead AICD device overlies the left hemithorax. Monitoring leads overlie the patient. Stable cardiomegaly. No consolidative pulmonary opacities. No pleural effusion or pneumothorax. Thoracic spine degenerative changes. IMPRESSION: Cardiomegaly.  No acute cardiopulmonary process. Electronically Signed   By: Annia Belt M.D.   On: 05/12/2017 12:24   Dg Chest Portable 1 View  Result Date: 05/10/2017 CLINICAL DATA:  Acute onset shortness of breath. Currently undergoing CPAP. Current history of CHF, diabetes and atrial fibrillation. Former smoker. EXAM: PORTABLE CHEST 1 VIEW  COMPARISON:  04/14/2017, 03/23/2017 and earlier, including CTA chest 11/30/2016 and earlier. FINDINGS: Suboptimal inspiration accounts for crowded bronchovascular markings, especially in the bases, and accentuates the cardiac silhouette. Taking this into account, cardiac silhouette moderately enlarged, unchanged. Left subclavian single lead transvenous pacemaker intact in its visualized portion. Prominent bronchovascular markings diffusely and moderate central peribronchial thickening, more so than on the examination 04/14/2017. Pulmonary vascularity normal without evidence pulmonary edema. No visible pleural effusions. No confluent airspace consolidation. IMPRESSION: Moderate changes of acute bronchitis and/or asthma without focal airspace pneumonia. Stable cardiomegaly without pulmonary edema. Electronically Signed   By: Hulan Saas M.D.   On: 05/10/2017 16:34   Dg Chest Port 1 View  Result Date: 04/14/2017 CLINICAL DATA:  Shortness of breath EXAM: PORTABLE CHEST 1 VIEW COMPARISON:  None. FINDINGS: Cardiomediastinal silhouette is enlarged. There is a left chest wall single lead AICD with tip overlying the left ventricle. There is pulmonary vascular congestion without overt edema. No focal airspace consolidation. No pneumothorax or pleural effusion.  IMPRESSION: Cardiomegaly and pulmonary vascular congestion without overt pulmonary edema. Electronically Signed   By: Deatra Robinson M.D.   On: 04/14/2017 02:15    Time Spent in minutes  30   Pearson Grippe M.D on 05/13/2017 at 8:19 AM  Between 7am to 7pm - Pager - [phone]  After 7pm go to [email or web address] - password [facility]  [facility] -  Office  [phone]

## 2017-05-14 LAB — CBC
HEMATOCRIT: 34.1 % — AB (ref 39.0–52.0)
HEMOGLOBIN: 11 g/dL — AB (ref 13.0–17.0)
MCH: 30 pg (ref 26.0–34.0)
MCHC: 32.3 g/dL (ref 30.0–36.0)
MCV: 92.9 fL (ref 78.0–100.0)
Platelets: 216 10*3/uL (ref 150–400)
RBC: 3.67 MIL/uL — AB (ref 4.22–5.81)
RDW: 13.6 % (ref 11.5–15.5)
WBC: 10.3 10*3/uL (ref 4.0–10.5)

## 2017-05-14 LAB — BASIC METABOLIC PANEL
Anion gap: 11 (ref 5–15)
BUN: 29 mg/dL — ABNORMAL HIGH (ref 6–20)
CO2: 31 mmol/L (ref 22–32)
Calcium: 9 mg/dL (ref 8.9–10.3)
Chloride: 97 mmol/L — ABNORMAL LOW (ref 101–111)
Creatinine, Ser: 1.31 mg/dL — ABNORMAL HIGH (ref 0.61–1.24)
GFR calc Af Amer: 58 mL/min — ABNORMAL LOW (ref >60)
GFR calc non Af Amer: 50 mL/min — ABNORMAL LOW (ref >60)
Glucose, Bld: 116 mg/dL — ABNORMAL HIGH (ref 65–99)
Potassium: 3.2 mmol/L — ABNORMAL LOW (ref 3.5–5.1)
Sodium: 139 mmol/L (ref 135–145)

## 2017-05-14 LAB — GLUCOSE, CAPILLARY
GLUCOSE-CAPILLARY: 119 mg/dL — AB (ref 65–99)
GLUCOSE-CAPILLARY: 166 mg/dL — AB (ref 65–99)

## 2017-05-14 MED ORDER — POTASSIUM CHLORIDE CRYS ER 20 MEQ PO TBCR
30.0000 meq | EXTENDED_RELEASE_TABLET | Freq: Once | ORAL | Status: AC
  Administered 2017-05-14: 30 meq via ORAL
  Filled 2017-05-14: qty 1

## 2017-05-14 MED ORDER — PREDNISONE 10 MG PO TABS: ORAL_TABLET | ORAL | 0 refills | Status: DC

## 2017-05-14 MED ORDER — IPRATROPIUM-ALBUTEROL 0.5-2.5 (3) MG/3ML IN SOLN: 3.0000 mL | RESPIRATORY_TRACT | 0 refills | Status: DC | PRN

## 2017-05-14 NOTE — Discharge Summary (Signed)
Ryan Macdonald, is a 83 y.o. male  DOB [birth year]  MRN [id number].  Admission date:  05/10/2017  Admitting Physician  Michael Litter, MD  Discharge Date:  05/14/2017   Primary MD  Center, [facility]  Recommendations for primary care physician for things to follow:      Acute hypoxic respiratory failure secondary to acute bronchitis +/- acute CHF exacerbation Cont lasix Prednisone 20mg  po qday x1 day then 10mg  po qday x 2 days then stop Duoneb prn  Cough Resume robitussin  Hypokalemia repleted Check cmp in 2 weeks  Hypomagnesemia  repleted Check magnesium in 2 weeks  Acute on chronic combined CHF Continue coreg, isordil, hydralazine, spironolactone, lasix 60mg  po bid Check daily weight  PAF Cont eliquis  BPH Cont Proscar, flomax  CKD stage III baseline Is 1.3  DM Resume home medication  Short run of v tach -Mg low-- replaced Continue carvedilol  Patient gets meds/DME supplies through [place]-- fax number for d/c summary and PCP is [phone] (Dr. Cleon Dew at [facility])   Admission Diagnosis  Acute respiratory failure with hypoxia (HCC) [J96.01]   Discharge Diagnosis  Acute respiratory failure with hypoxia (HCC) [J96.01]    Principal Problem:   Acute respiratory failure with hypoxia (HCC) Active Problems:   Nonischemic cardiomyopathy (HCC)   Obesity (BMI 30-39.9)   Essential hypertension   Hyperlipidemia   Atrial fibrillation (HCC)   CKD (chronic kidney disease), stage III   ICD (implantable cardioverter-defibrillator) in place   Diabetes mellitus with complication (HCC)   Acute bronchitis      Past Medical History:  Diagnosis Date  . Cat scratch fever    removed mass on right side neck  . Cataracts, bilateral   . CHF (congestive heart failure) (HCC)   . Diabetes mellitus without complication (HCC)    TYPE 2  . High cholesterol   .  Hypertension   . Kidney stones   . Morbid obesity (HCC)   . Nonischemic cardiomyopathy (HCC)    a. ? 2009 Cath in [place] - nl cors per pt;  b. 09/2013 Echo: EF 25-30%, sev diff HK.  . Osteoarthritis   . PAF (paroxysmal atrial fibrillation) (HCC)    a. post-op hip in 2014 - prev on xarelto.  . Shortness of breath dyspnea     Past Surgical History:  Procedure Laterality Date  . CARDIAC CATHETERIZATION  1980's  . CIRCUMCISION  1940's  . CYSTOSCOPY WITH URETHRAL DILATATION  10/23/2013  . CYSTOSCOPY WITH URETHRAL DILATATION N/A 10/23/2013   Procedure: CYSTOSCOPY WITH URETHRAL DILATATION;  Surgeon: Kathi Ludwig, MD;  Location: [facility] OR;  Service: Urology;  Laterality: N/A;  . INCISION AND DRAINAGE OF WOUND Right 1980's   "cat scratch" (10/23/2013)  . INGUINAL HERNIA REPAIR Right 1950's  . KIDNEY STONE SURGERY Right 1970's; 1983   "twice"  . TONSILLECTOMY AND ADENOIDECTOMY  1940's  . TOTAL HIP ARTHROPLASTY Left 10/23/2013  . TOTAL HIP ARTHROPLASTY Left 10/23/2013   Procedure: TOTAL HIP ARTHROPLASTY;  Surgeon: Valeria Batman, MD;  Location: [facility] OR;  Service: Orthopedics;  Laterality: Left;       HPI  from the history and physical done on the day of admission:      83 y.o. 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  The patient reports progressive shortness of breat over a period of 30 minutes or so while at rest (he was watching TV).  He denies associated chest pain.  He has a cough, but it is nonproductive.  He denies sick contacts.  No fever.  No nausea and vomiting.  No new swelling. He checks his weights daily.  He was admitted in May for CHF exacerbation, and he feels that this presentation is similar.  ED Course: Patient was brought in by EMS.  He required CPAP  for O2 sas in the low 80's en route  He was transitioned to BiPAP upon arrival here.  He was on a nitroglycerin drip briefly.  He received lasix 60mg  IV x one, duoneb, and solumerol 125mg  IV x one.  Chest xray shows moderate changes consistent with bronchitis and/or asthma; no pneumonia; stable cardiomegaly without pulmonary edema.  ABG showed pH 7.397, pCO2 43, pO2 64.  BNP 282.  Troponin 0.04.  Respiratory failure thought to be multifactorial.  Patient weaned from BiPAP in the ED.  Hospitalist asked to admit.    Hospital Course:        Pt was admitted for Acute respiratory failure secondary to bronchitis and CHF.  Pt was diuresed with iv lasix and then transitioned to po lasix.  -2.9L during stay.  Pt started on duoneb and  low dose prednisone and this is being tapered to off.  Pt feels like he is at his baseline after diuresis and will be discharge to home    Follow UP  Follow-up Information    Center, [facility] Follow up in 1 week(s).   Specialty:  General Practice Contact information: [address] [place] [address] [phone]            Consults obtained -  none Discharge Condition: stable  Diet and Activity recommendation: See Discharge Instructions below  Discharge Instructions         Discharge Medications     Allergies as of 05/14/2017      Reactions   Lisinopril Swelling      Medication List    TAKE these medications   allopurinol 100 MG tablet Commonly known as:  ZYLOPRIM Take 200 mg by mouth daily.   apixaban 5 MG Tabs tablet Commonly known as:  ELIQUIS Take 1 tablet (5 mg total) by mouth 2 (two) times daily.   carvedilol 12.5 MG tablet Commonly known as:  COREG Take 0.5 tablets (6.25 mg total) by mouth 2 (two) times daily with a meal.   cyanocobalamin 1000 MCG tablet Take 1,000 mcg by mouth daily.   diphenhydrAMINE 25 mg capsule Commonly known as:  BENADRYL Take 25 mg by mouth at bedtime as needed for sleep.   finasteride 5  MG tablet Commonly known as:  PROSCAR Take 5 mg by mouth daily.   furosemide 20 MG tablet Commonly known as:  LASIX Take 3 tablets (60 mg total) by mouth 2 (two) times daily.   glipiZIDE 5 MG tablet Commonly known as:  GLUCOTROL Take 5  mg by mouth daily before breakfast.   guaifenesin 100 MG/5ML syrup Commonly known as:  ROBITUSSIN Take 100 mg by mouth 3 (three) times daily as needed for cough.   hydrALAZINE 25 MG tablet Commonly known as:  APRESOLINE Take 25 mg by mouth 3 (three) times daily.   HYDROcodone-acetaminophen 5-325 MG tablet Commonly known as:  NORCO/VICODIN Take 2 tablets by mouth every 4 (four) hours as needed. What changed:  how much to take  when to take this   ipratropium-albuterol 0.5-2.5 (3) MG/3ML Soln Commonly known as:  DUONEB Take 3 mLs by nebulization every 4 (four) hours as needed.   isosorbide dinitrate 40 MG CR capsule Commonly known as:  DILATRATE-SR Take 40 mg by mouth 3 (three) times daily.   ketoconazole 2 % shampoo Commonly known as:  NIZORAL Apply 1 application topically 2 (two) times a week.   MAGNESIUM PO Take 0.5 tablets by mouth 2 (two) times daily.   potassium chloride 10 MEQ tablet Commonly known as:  K-DUR Take 2 tablets (20 mEq total) by mouth daily.   predniSONE 10 MG tablet Commonly known as:  DELTASONE 20mg  po qday x1 day then 10mg  po qday x 2 days   sertraline 25 MG tablet Commonly known as:  ZOLOFT Take 50 mg by mouth daily as needed (for anxiety).   simvastatin 80 MG tablet Commonly known as:  ZOCOR Take 40 mg by mouth daily.   spironolactone 25 MG tablet Commonly known as:  ALDACTONE Take 25 mg by mouth daily.   tamsulosin 0.4 MG Caps capsule Commonly known as:  FLOMAX Take 0.4 mg by mouth at bedtime.       Major procedures and Radiology Reports - PLEASE review detailed and final reports for all details, in brief -      Dg Chest 2 View  Result Date: 05/12/2017 CLINICAL DATA:  Patient with  history of wheezing. EXAM: CHEST  2 VIEW COMPARISON:  Chest radiograph 05/10/2017. FINDINGS: Single lead AICD device overlies the left hemithorax. Monitoring leads overlie the patient. Stable cardiomegaly. No consolidative pulmonary opacities. No pleural effusion or pneumothorax. Thoracic spine degenerative changes. IMPRESSION: Cardiomegaly.  No acute cardiopulmonary process. Electronically Signed   By: Annia Belt M.D.   On: 05/12/2017 12:24   Dg Chest Portable 1 View  Result Date: 05/10/2017 CLINICAL DATA:  Acute onset shortness of breath. Currently undergoing CPAP. Current history of CHF, diabetes and atrial fibrillation. Former smoker. EXAM: PORTABLE CHEST 1 VIEW COMPARISON:  04/14/2017, 03/23/2017 and earlier, including CTA chest 11/30/2016 and earlier. FINDINGS: Suboptimal inspiration accounts for crowded bronchovascular markings, especially in the bases, and accentuates the cardiac silhouette. Taking this into account, cardiac silhouette moderately enlarged, unchanged. Left subclavian single lead transvenous pacemaker intact in its visualized portion. Prominent bronchovascular markings diffusely and moderate central peribronchial thickening, more so than on the examination 04/14/2017. Pulmonary vascularity normal without evidence pulmonary edema. No visible pleural effusions. No confluent airspace consolidation. IMPRESSION: Moderate changes of acute bronchitis and/or asthma without focal airspace pneumonia. Stable cardiomegaly without pulmonary edema. Electronically Signed   By: Hulan Saas M.D.   On: 05/10/2017 16:34    Micro Results     No results found for this or any previous visit (from the past 240 hour(s)).     Today   Subjective    [name] today feels like his breathing is at baseline.   no headache,no chest abdominal pain,no new weakness tingling or numbness, feels much better wants to go home today. *  Objective   Blood pressure 103/78, pulse (!) 104,  temperature 98.5 F (36.9 C), temperature source Oral, resp. rate 16, height 5\' 7"  (1.702 m), weight 104.8 kg (231 lb 1.6 oz), SpO2 95 %.   Intake/Output Summary (Last 24 hours) at 05/14/17 1105 Last data filed at 05/14/17 0415  Gross per 24 hour  Intake               30 ml  Output             2665 ml  Net            -2635 ml    Exam Awake Alert, Oriented x 3, No new F.N deficits, Normal affect [place].AT,PERRAL Supple Neck,No JVD, No cervical lymphadenopathy appriciated.  Symmetrical Chest wall movement, Good air movement bilaterally, CTAB RRR,No Gallops,Rubs or new Murmurs, No Parasternal Heave +ve B.Sounds, Abd Soft, Non tender, No organomegaly appriciated, No rebound -guarding or rigidity. No Cyanosis, Clubbing or edema, No new Rash or bruise   Data Review   CBC w Diff: Lab Results  Component Value Date   WBC 10.3 05/14/2017   HGB 11.0 (L) 05/14/2017   HCT 34.1 (L) 05/14/2017   PLT 216 05/14/2017   LYMPHOPCT 10 05/10/2017   MONOPCT 6 05/10/2017   EOSPCT 2 05/10/2017   BASOPCT 0 05/10/2017    CMP: Lab Results  Component Value Date   NA 139 05/14/2017   NA 138 05/30/2015   K 3.2 (L) 05/14/2017   CL 97 (L) 05/14/2017   CO2 31 05/14/2017   BUN 29 (H) 05/14/2017   BUN 52 (A) 05/30/2015   CREATININE 1.31 (H) 05/14/2017   CREATININE 1.68 (H) 05/28/2016   GLU 100 05/30/2015   PROT 6.7 04/14/2017   ALBUMIN 3.2 (L) 04/14/2017   BILITOT 1.0 04/14/2017   ALKPHOS 48 04/14/2017   AST 26 04/14/2017   ALT 18 04/14/2017  .   Total Time in preparing paper work, data evaluation and todays exam - 35 minutes  Pearson Grippe M.D on 05/14/2017 at 11:05 AM  [facility]   Office  [phone]

## 2017-06-13 ENCOUNTER — Emergency Department (HOSPITAL_COMMUNITY): Payer: Non-veteran care

## 2017-06-13 ENCOUNTER — Emergency Department (HOSPITAL_COMMUNITY)
Admission: EM | Admit: 2017-06-13 | Discharge: 2017-06-13 | Disposition: A | Discharge status: Home / Self Care | Payer: Non-veteran care | Attending: Physician Assistant | Admitting: Physician Assistant

## 2017-06-13 ENCOUNTER — Encounter (HOSPITAL_COMMUNITY): Payer: Self-pay

## 2017-06-13 DIAGNOSIS — N183 Chronic kidney disease, stage 3 (moderate): Secondary | ICD-10-CM | POA: Diagnosis not present

## 2017-06-13 DIAGNOSIS — I5032 Chronic diastolic (congestive) heart failure: Secondary | ICD-10-CM | POA: Insufficient documentation

## 2017-06-13 DIAGNOSIS — Z7984 Long term (current) use of oral hypoglycemic drugs: Secondary | ICD-10-CM | POA: Diagnosis not present

## 2017-06-13 DIAGNOSIS — Z79899 Other long term (current) drug therapy: Secondary | ICD-10-CM | POA: Insufficient documentation

## 2017-06-13 DIAGNOSIS — E119 Type 2 diabetes mellitus without complications: Secondary | ICD-10-CM | POA: Insufficient documentation

## 2017-06-13 DIAGNOSIS — Z7901 Long term (current) use of anticoagulants: Secondary | ICD-10-CM | POA: Insufficient documentation

## 2017-06-13 DIAGNOSIS — I130 Hypertensive heart and chronic kidney disease with heart failure and stage 1 through stage 4 chronic kidney disease, or unspecified chronic kidney disease: Secondary | ICD-10-CM | POA: Insufficient documentation

## 2017-06-13 DIAGNOSIS — R0602 Shortness of breath: Secondary | ICD-10-CM | POA: Diagnosis present

## 2017-06-13 LAB — I-STAT TROPONIN, ED: TROPONIN I, POC: 0.04 ng/mL (ref 0.00–0.08)

## 2017-06-13 LAB — COMPREHENSIVE METABOLIC PANEL
ALBUMIN: 3.4 g/dL — AB (ref 3.5–5.0)
ALK PHOS: 59 U/L (ref 38–126)
ALT: 15 U/L — AB (ref 17–63)
ANION GAP: 10 (ref 5–15)
AST: 22 U/L (ref 15–41)
BILIRUBIN TOTAL: 0.4 mg/dL (ref 0.3–1.2)
BUN: 18 mg/dL (ref 6–20)
CALCIUM: 9.1 mg/dL (ref 8.9–10.3)
CO2: 29 mmol/L (ref 22–32)
Chloride: 102 mmol/L (ref 101–111)
Creatinine, Ser: 1.22 mg/dL (ref 0.61–1.24)
GFR calc Af Amer: >60 mL/min (ref >60)
GFR calc non Af Amer: 54 mL/min — ABNORMAL LOW (ref >60)
GLUCOSE: 102 mg/dL — AB (ref 65–99)
Potassium: 3.5 mmol/L (ref 3.5–5.1)
SODIUM: 141 mmol/L (ref 135–145)
Total Protein: 7.3 g/dL (ref 6.5–8.1)

## 2017-06-13 LAB — CBC WITH DIFFERENTIAL/PLATELET
BASOS PCT: 0 %
Basophils Absolute: 0 10*3/uL (ref 0.0–0.1)
EOS PCT: 3 %
Eosinophils Absolute: 0.2 10*3/uL (ref 0.0–0.7)
HEMATOCRIT: 38 % — AB (ref 39.0–52.0)
Hemoglobin: 12.4 g/dL — ABNORMAL LOW (ref 13.0–17.0)
Lymphocytes Relative: 20 %
Lymphs Abs: 1.8 10*3/uL (ref 0.7–4.0)
MCH: 30.5 pg (ref 26.0–34.0)
MCHC: 32.6 g/dL (ref 30.0–36.0)
MCV: 93.4 fL (ref 78.0–100.0)
MONO ABS: 0.9 10*3/uL (ref 0.1–1.0)
Monocytes Relative: 10 %
NEUTROS ABS: 6 10*3/uL (ref 1.7–7.7)
Neutrophils Relative %: 67 %
Platelets: 258 10*3/uL (ref 150–400)
RBC: 4.07 MIL/uL — ABNORMAL LOW (ref 4.22–5.81)
RDW: 13.8 % (ref 11.5–15.5)
WBC: 8.9 10*3/uL (ref 4.0–10.5)

## 2017-06-13 LAB — BRAIN NATRIURETIC PEPTIDE: B Natriuretic Peptide: 338.5 pg/mL — ABNORMAL HIGH (ref 0.0–100.0)

## 2017-06-13 MED ORDER — FUROSEMIDE 10 MG/ML IJ SOLN
40.0000 mg | Freq: Once | INTRAMUSCULAR | Status: AC
  Administered 2017-06-13: 40 mg via INTRAVENOUS
  Filled 2017-06-13: qty 4

## 2017-06-13 NOTE — ED Notes (Signed)
Patient transported to X-ray 

## 2017-06-13 NOTE — ED Notes (Signed)
Patient returned from X-ray 

## 2017-06-13 NOTE — ED Notes (Signed)
Pt O2 sat decreased to approx 91-92% on RA. Pt placed on 1L O2 for comfort, O2 increased to 95%.

## 2017-06-13 NOTE — ED Notes (Signed)
ED Provider at bedside. 

## 2017-06-13 NOTE — ED Provider Notes (Signed)
[facility]-EMERGENCY DEPT Provider Note   CSN: [id number] Arrival date & time: 06/13/17  1743     History   Chief Complaint Chief Complaint  Patient presents with  . Shortness of Breath  . Congestive Heart Failure    HPI Ryan Macdonald is a 83 y.o. male.  HPI   Patient is an 83 year old male with past medical history significant for A. Fib, CHF with recent admission a couple weeks ago.  Here today because he was worried that he was getting SOB. HE has no SOB now, weighs himself daily, no weight increase. No swelling. 98% on RA. No chest pain    Past Medical History:  Diagnosis Date  . Cat scratch fever    removed mass on right side neck  . Cataracts, bilateral   . CHF (congestive heart failure) (HCC)   . Diabetes mellitus without complication (HCC)    TYPE 2  . High cholesterol   . Hypertension   . Kidney stones   . Morbid obesity (HCC)   . Nonischemic cardiomyopathy (HCC)    a. ? 2009 Cath in [place] - nl cors per pt;  b. 09/2013 Echo: EF 25-30%, sev diff HK.  . Osteoarthritis   . PAF (paroxysmal atrial fibrillation) (HCC)    a. post-op hip in 2014 - prev on xarelto.  . Shortness of breath dyspnea     Patient Active Problem List   Diagnosis Date Noted  . Acute bronchitis 05/10/2017  . CHF exacerbation (HCC) 04/14/2017  . Elevated troponin 04/14/2017  . Lactic acidosis   . Acute on chronic systolic heart failure, NYHA class 2 (HCC) 02/03/2017  . Diabetes mellitus type 2 in obese (HCC) 02/03/2017  . CAP (community acquired pneumonia) 02/03/2017  . Lobar pneumonia (HCC)   . Diabetes mellitus with complication (HCC)   . Acute on chronic respiratory failure with hypoxia (HCC)   . Acute on chronic combined systolic and diastolic CHF (congestive heart failure) (HCC) 05/19/2016  . Depression 05/19/2016  . Gout 05/19/2016  . 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 NSVT (nonsustained ventricular tachycardia) (HCC)   . CKD (chronic kidney disease),  stage III   . Pulmonary embolism (HCC)   . Acute respiratory failure with hypoxia (HCC)   . Osteoarthritis of left hip 10/25/2013  . Atrial fibrillation (HCC) 10/25/2013  . S/P hip replacement 10/24/2013  . Nonischemic cardiomyopathy (HCC)   . Obesity (BMI 30-39.9)   . Essential hypertension   . Hyperlipidemia     Past Surgical History:  Procedure Laterality Date  . CARDIAC CATHETERIZATION  1980's  . CIRCUMCISION  1940's  . CYSTOSCOPY WITH URETHRAL DILATATION  10/23/2013  . CYSTOSCOPY WITH URETHRAL DILATATION N/A 10/23/2013   Procedure: CYSTOSCOPY WITH URETHRAL DILATATION;  Surgeon: Kathi Ludwig, MD;  Location: [facility] OR;  Service: Urology;  Laterality: N/A;  . INCISION AND DRAINAGE OF WOUND Right 1980's   "cat scratch" (10/23/2013)  . INGUINAL HERNIA REPAIR Right 1950's  . KIDNEY STONE SURGERY Right 1970's; 1983   "twice"  . TONSILLECTOMY AND ADENOIDECTOMY  1940's  . TOTAL HIP ARTHROPLASTY Left 10/23/2013  . TOTAL HIP ARTHROPLASTY Left 10/23/2013   Procedure: TOTAL HIP ARTHROPLASTY;  Surgeon: Valeria Batman, MD;  Location: [facility] OR;  Service: Orthopedics;  Laterality: Left;       Home Medications    Prior to Admission medications   Medication Sig Start Date End Date Taking? Authorizing Provider  allopurinol (ZYLOPRIM) 100 MG tablet Take 200 mg by mouth daily.  [provider]  apixaban (ELIQUIS) 5 MG TABS tablet Take 1 tablet (5 mg total) by mouth 2 (two) times daily. 02/06/17   Amin, Loura Halt, MD  carvedilol (COREG) 12.5 MG tablet Take 0.5 tablets (6.25 mg total) by mouth 2 (two) times daily with a meal. 04/15/17   Osvaldo Shipper, MD  cyanocobalamin 1000 MCG tablet Take 1,000 mcg by mouth daily.     [provider]  diphenhydrAMINE (BENADRYL) 25 mg capsule Take 25 mg by mouth at bedtime as needed for sleep.    [provider]  finasteride (PROSCAR) 5 MG tablet Take 5 mg by mouth daily.    [provider]  furosemide (LASIX) 20  MG tablet Take 3 tablets (60 mg total) by mouth 2 (two) times daily. 04/15/17   Osvaldo Shipper, MD  glipiZIDE (GLUCOTROL) 5 MG tablet Take 5 mg by mouth daily before breakfast.    [provider]  guaifenesin (ROBITUSSIN) 100 MG/5ML syrup Take 100 mg by mouth 3 (three) times daily as needed for cough.    [provider]  hydrALAZINE (APRESOLINE) 25 MG tablet Take 25 mg by mouth 3 (three) times daily.    [provider]  HYDROcodone-acetaminophen (NORCO/VICODIN) 5-325 MG tablet Take 2 tablets by mouth every 4 (four) hours as needed. Patient taking differently: Take 1 tablet by mouth 2 (two) times daily.  04/09/16   Isa Rankin, MD  ipratropium-albuterol (DUONEB) 0.5-2.5 (3) MG/3ML SOLN Take 3 mLs by nebulization every 4 (four) hours as needed. 05/14/17   Pearson Grippe, MD  isosorbide dinitrate (DILATRATE-SR) 40 MG CR capsule Take 40 mg by mouth 3 (three) times daily.    [provider]  ketoconazole (NIZORAL) 2 % shampoo Apply 1 application topically 2 (two) times a week.    [provider]  MAGNESIUM PO Take 0.5 tablets by mouth 2 (two) times daily.    [provider]  potassium chloride (K-DUR) 10 MEQ tablet Take 2 tablets (20 mEq total) by mouth daily. 04/15/17 05/10/17  Osvaldo Shipper, MD  predniSONE (DELTASONE) 10 MG tablet 20mg  po qday x1 day then 10mg  po qday x 2 days 05/14/17   Pearson Grippe, MD  sertraline (ZOLOFT) 25 MG tablet Take 50 mg by mouth daily as needed (for anxiety).     [provider]  simvastatin (ZOCOR) 80 MG tablet Take 40 mg by mouth daily.    [provider]  spironolactone (ALDACTONE) 25 MG tablet Take 25 mg by mouth daily.    [provider]  tamsulosin (FLOMAX) 0.4 MG CAPS capsule Take 0.4 mg by mouth at bedtime.    [provider]    Family History Family History  Problem Relation Age of Onset  . Heart Problems Maternal Grandmother   . Other Unknown        negative for premature CAD      Social History Social History  Substance Use Topics  . Smoking status: Former Smoker    Packs/day: 1.00    Years: 15.00    Types: Cigarettes, Cigars  . Smokeless tobacco: Former Neurosurgeon     Comment: quit smoking ~ 40 yr ago  . Alcohol use No     Comment: rare beer.     Allergies   Lisinopril   Review of Systems Review of Systems  Constitutional: Negative for activity change.  Respiratory: Negative for cough, chest tightness and shortness of breath.   Cardiovascular: Negative for chest pain.  Gastrointestinal: Negative for abdominal pain.  Physical Exam Updated Vital Signs BP 126/86   Pulse (!) 103   Temp 99.3 F (37.4 C) (Oral)   Resp (!) 21   Ht 5\' 7"  (1.702 m)   Wt 108.9 kg (240 lb)   SpO2 93%   BMI 37.59 kg/m   Physical Exam  Constitutional: He is oriented to person, place, and time. He appears well-nourished.  HENT:  Head: Normocephalic.  Eyes: Conjunctivae are normal.  Cardiovascular: Normal rate and regular rhythm.   Pulmonary/Chest: Effort normal and breath sounds normal. No respiratory distress.  Abdominal: Soft. He exhibits no distension. There is no tenderness.  Neurological: He is oriented to person, place, and time.  Skin: Skin is warm and dry. He is not diaphoretic.  Psychiatric: He has a normal mood and affect. His behavior is normal.     ED Treatments / Results  Labs (all labs ordered are listed, but only abnormal results are displayed) Labs Reviewed  COMPREHENSIVE METABOLIC PANEL  CBC WITH DIFFERENTIAL/PLATELET  BRAIN NATRIURETIC PEPTIDE  I-STAT TROPOININ, ED    EKG  EKG Interpretation None       Radiology No results found.  Procedures Procedures (including critical care time)  Medications Ordered in ED Medications - No data to display   Initial Impression / Assessment and Plan / ED Course  I have reviewed the triage vital signs and the nursing notes.  Pertinent labs & imaging results that were available during  my care of the patient were reviewed by me and considered in my medical decision making (see chart for details).     Well-appearing 83 year old male presenting with shortness of breath. Patient does not actually have shortness breath, he is worried that he might develop shortness of breath soon. Patient's had no weight gain no oxygen requirements no swelling. Patient is 98% on room air. We will check for signs of fluid overload. However physical exam is very reassuring as well as vital signs. Patient did not have any chest pain.  8:41 PM Labs, vitals signs nad xray reassuring. Gave one dose of lasix IV and will have him follow up as outpatient. Feels baseline.   Final Clinical Impressions(s) / ED Diagnoses   Final diagnoses:  None    New Prescriptions New Prescriptions   No medications on file     [name], MD 06/13/17 2042

## 2017-06-13 NOTE — ED Triage Notes (Signed)
Pt with hx of CHF and A fib from home via EMS with SOB. Pt reports he was watching TV when he felt as though "his breathing was 10-15% worse than normal". Pt reports he has been to the hospital multiple times for similar and "usually ends up on O2". 140/90, 96% on RA, 90-105 bpm A fib. Denies N/V, dipahoresis, CP. A&Ox4, ambulatory to bed. Diminished all fields, tachypneic. NAD noted.

## 2017-06-13 NOTE — Discharge Instructions (Signed)
Follow up with your providers as planned as an outaptient.

## 2017-06-21 ENCOUNTER — Emergency Department (HOSPITAL_COMMUNITY)
Admission: EM | Admit: 2017-06-21 | Discharge: 2017-06-21 | Disposition: A | Discharge status: Home / Self Care | Payer: Non-veteran care | Attending: Emergency Medicine | Admitting: Emergency Medicine

## 2017-06-21 ENCOUNTER — Encounter (HOSPITAL_COMMUNITY): Payer: Self-pay | Admitting: Emergency Medicine

## 2017-06-21 ENCOUNTER — Emergency Department (HOSPITAL_COMMUNITY): Payer: Non-veteran care

## 2017-06-21 DIAGNOSIS — I110 Hypertensive heart disease with heart failure: Secondary | ICD-10-CM | POA: Diagnosis not present

## 2017-06-21 DIAGNOSIS — N183 Chronic kidney disease, stage 3 (moderate): Secondary | ICD-10-CM | POA: Diagnosis not present

## 2017-06-21 DIAGNOSIS — R0602 Shortness of breath: Secondary | ICD-10-CM | POA: Diagnosis not present

## 2017-06-21 DIAGNOSIS — E7800 Pure hypercholesterolemia, unspecified: Secondary | ICD-10-CM | POA: Diagnosis not present

## 2017-06-21 DIAGNOSIS — I129 Hypertensive chronic kidney disease with stage 1 through stage 4 chronic kidney disease, or unspecified chronic kidney disease: Secondary | ICD-10-CM | POA: Insufficient documentation

## 2017-06-21 DIAGNOSIS — E119 Type 2 diabetes mellitus without complications: Secondary | ICD-10-CM | POA: Insufficient documentation

## 2017-06-21 DIAGNOSIS — Z79899 Other long term (current) drug therapy: Secondary | ICD-10-CM | POA: Diagnosis not present

## 2017-06-21 DIAGNOSIS — Z87891 Personal history of nicotine dependence: Secondary | ICD-10-CM | POA: Insufficient documentation

## 2017-06-21 DIAGNOSIS — I5042 Chronic combined systolic (congestive) and diastolic (congestive) heart failure: Secondary | ICD-10-CM | POA: Insufficient documentation

## 2017-06-21 DIAGNOSIS — I480 Paroxysmal atrial fibrillation: Secondary | ICD-10-CM | POA: Insufficient documentation

## 2017-06-21 LAB — CBC WITH DIFFERENTIAL/PLATELET
BASOS ABS: 0 10*3/uL (ref 0.0–0.1)
BASOS PCT: 0 %
Eosinophils Absolute: 0.2 10*3/uL (ref 0.0–0.7)
Eosinophils Relative: 2 %
HCT: 37 % — ABNORMAL LOW (ref 39.0–52.0)
Hemoglobin: 12.1 g/dL — ABNORMAL LOW (ref 13.0–17.0)
LYMPHS PCT: 21 %
Lymphs Abs: 1.6 10*3/uL (ref 0.7–4.0)
MCH: 30 pg (ref 26.0–34.0)
MCHC: 32.7 g/dL (ref 30.0–36.0)
MCV: 91.6 fL (ref 78.0–100.0)
MONO ABS: 0.8 10*3/uL (ref 0.1–1.0)
Monocytes Relative: 10 %
NEUTROS ABS: 5.2 10*3/uL (ref 1.7–7.7)
Neutrophils Relative %: 67 %
Platelets: 251 10*3/uL (ref 150–400)
RBC: 4.04 MIL/uL — ABNORMAL LOW (ref 4.22–5.81)
RDW: 14 % (ref 11.5–15.5)
WBC: 7.8 10*3/uL (ref 4.0–10.5)

## 2017-06-21 LAB — COMPREHENSIVE METABOLIC PANEL
ALBUMIN: 3.2 g/dL — AB (ref 3.5–5.0)
ALT: 17 U/L (ref 17–63)
AST: 28 U/L (ref 15–41)
Alkaline Phosphatase: 55 U/L (ref 38–126)
Anion gap: 8 (ref 5–15)
BILIRUBIN TOTAL: 0.8 mg/dL (ref 0.3–1.2)
BUN: 25 mg/dL — AB (ref 6–20)
CO2: 28 mmol/L (ref 22–32)
CREATININE: 1.51 mg/dL — AB (ref 0.61–1.24)
Calcium: 9.3 mg/dL (ref 8.9–10.3)
Chloride: 105 mmol/L (ref 101–111)
GFR calc Af Amer: 49 mL/min — ABNORMAL LOW (ref >60)
GFR, EST NON AFRICAN AMERICAN: 42 mL/min — AB (ref >60)
GLUCOSE: 104 mg/dL — AB (ref 65–99)
Potassium: 4.1 mmol/L (ref 3.5–5.1)
Sodium: 141 mmol/L (ref 135–145)
TOTAL PROTEIN: 6.9 g/dL (ref 6.5–8.1)

## 2017-06-21 LAB — TROPONIN I
Troponin I: 0.06 ng/mL (ref <0.03)
Troponin I: 0.06 ng/mL (ref <0.03)

## 2017-06-21 LAB — BRAIN NATRIURETIC PEPTIDE: B NATRIURETIC PEPTIDE 5: 301.9 pg/mL — AB (ref 0.0–100.0)

## 2017-06-21 MED ORDER — HYDROCODONE-ACETAMINOPHEN 5-325 MG PO TABS
1.0000 | ORAL_TABLET | Freq: Once | ORAL | Status: AC
  Administered 2017-06-21: 1 via ORAL
  Filled 2017-06-21: qty 1

## 2017-06-21 MED ORDER — ASPIRIN 81 MG PO CHEW
324.0000 mg | CHEWABLE_TABLET | Freq: Once | ORAL | Status: DC
  Filled 2017-06-21: qty 4

## 2017-06-21 NOTE — ED Provider Notes (Signed)
[facility]EMER[facility]ENCY DEPT Provider Note   CSN: [id number] Arrival date & time: 06/21/17  1548     History   Chief Complaint Chief Complaint  Patient presents with  . Shortness of Breath   HPI   Temperature 98.1 F (36.7 C), temperature source Oral, SpO2 98 %.  Ryan Macdonald is a 83 y.o. male with past medical history significant for diabetes, A. fib (chronically takes eloquence), CHF complaining of shortness of breath onset this afternoon at 1:30 PM while he was watching television. He can't really describe his shortness of breath but he states that his breathing doesn't feel right. There is no associated chest pain, peripheral edema, it's not exacerbated by walking or climbing steps. He states that he called 911 himself of the annulus without issue. He states he feels much better after 2 L of oxygen were administered by EMS. He is not chronically on oxygen, he doesn't have a history of COPD, there's been no wheezing. He denies any palpitations, syncope, abdominal pain. Patient states he weighs himself readily. Take 60 mg of Lasix daily. He took 80 mg today and has been urinating significantly. Last weight was 241 pounds. [facility]  Past Medical History:  Diagnosis Date  . Cat scratch fever    removed mass on right side neck  . Cataracts, bilateral   . CHF (congestive heart failure) (HCC)   . Diabetes mellitus without complication (HCC)    TYPE 2  . High cholesterol   . Hypertension   . Kidney stones   . Morbid obesity (HCC)   . Nonischemic cardiomyopathy (HCC)    a. ? 2009 Cath in [place] - nl cors per pt;  b. 09/2013 Echo: EF 25-30%, sev diff HK.  . Osteoarthritis   . PAF (paroxysmal atrial fibrillation) (HCC)    a. post-op hip in 2014 - prev on xarelto.  . Shortness of breath dyspnea     Patient Active Problem List   Diagnosis Date Noted  . Acute bronchitis 05/10/2017  . Acute congestive heart failure (HCC) 04/14/2017  . Elevated troponin  04/14/2017  . Lactic acidosis   . Acute on chronic systolic heart failure, NYHA class 2 (HCC) 02/03/2017  . Diabetes mellitus type 2 in obese (HCC) 02/03/2017  . CAP (community acquired pneumonia) 02/03/2017  . Lobar pneumonia (HCC)   . Diabetes mellitus with complication (HCC)   . Acute on chronic respiratory failure with hypoxia (HCC)   . Acute on chronic combined systolic and diastolic CHF (congestive heart failure) (HCC) 05/19/2016  . Depression 05/19/2016  . Gout 05/19/2016  . 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 NSVT (nonsustained ventricular tachycardia) (HCC)   . CKD (chronic kidney disease), stage III   . Pulmonary embolism (HCC)   . Acute respiratory failure with hypoxia (HCC)   . Osteoarthritis of left hip 10/25/2013  . Atrial fibrillation (HCC) 10/25/2013  . S/P hip replacement 10/24/2013  . Nonischemic cardiomyopathy (HCC)   . Obesity (BMI 30-39.9)   . Essential hypertension   . Hyperlipidemia     Past Surgical History:  Procedure Laterality Date  . CARDIAC CATHETERIZATION  1980's  . CIRCUMCISION  1940's  . CYSTOSCOPY WITH URETHRAL DILATATION  10/23/2013  . CYSTOSCOPY WITH URETHRAL DILATATION N/A 10/23/2013   Procedure: CYSTOSCOPY WITH URETHRAL DILATATION;  Surgeon: Kathi Ludwig, MD;  Location: [facility] OR;  Service: Urology;  Laterality: N/A;  . INCISION AND DRAINAGE OF WOUND Right 1980's   "cat scratch" (10/23/2013)  .  INGUINAL HERNIA REPAIR Right 1950's  . KIDNEY STONE SURGERY Right 1970's; 1983   "twice"  . TONSILLECTOMY AND ADENOIDECTOMY  1940's  . TOTAL HIP ARTHROPLASTY Left 10/23/2013  . TOTAL HIP ARTHROPLASTY Left 10/23/2013   Procedure: TOTAL HIP ARTHROPLASTY;  Surgeon: Valeria Batman, MD;  Location: [facility] OR;  Service: Orthopedics;  Laterality: Left;       Home Medications    Prior to Admission medications   Medication Sig Start Date End Date Taking? Authorizing Provider  allopurinol (ZYLOPRIM) 100 MG tablet  Take 200 mg by mouth daily.   Yes [provider]  apixaban (ELIQUIS) 5 MG TABS tablet Take 1 tablet (5 mg total) by mouth 2 (two) times daily. 02/06/17  Yes Amin, Loura Halt, MD  carvedilol (COREG) 12.5 MG tablet Take 0.5 tablets (6.25 mg total) by mouth 2 (two) times daily with a meal. 04/15/17  Yes Osvaldo Shipper, MD  cyanocobalamin 1000 MCG tablet Take 1,000 mcg by mouth daily.    Yes [provider]  diphenhydrAMINE (BENADRYL) 25 mg capsule Take 25 mg by mouth at bedtime as needed for sleep.   Yes [provider]  finasteride (PROSCAR) 5 MG tablet Take 5 mg by mouth daily.   Yes [provider]  furosemide (LASIX) 20 MG tablet Take 3 tablets (60 mg total) by mouth 2 (two) times daily. 04/15/17  Yes Osvaldo Shipper, MD  glipiZIDE (GLUCOTROL) 5 MG tablet Take 5 mg by mouth daily before breakfast.   Yes [provider]  guaifenesin (ROBITUSSIN) 100 MG/5ML syrup Take 100 mg by mouth 3 (three) times daily as needed for cough.   Yes [provider]  hydrALAZINE (APRESOLINE) 25 MG tablet Take 25 mg by mouth 3 (three) times daily.   Yes [provider]  HYDROcodone-acetaminophen (NORCO/VICODIN) 5-325 MG tablet Take 2 tablets by mouth every 4 (four) hours as needed. Patient taking differently: Take 1 tablet by mouth 2 (two) times daily.  04/09/16  Yes Isa Rankin, MD  ipratropium-albuterol (DUONEB) 0.5-2.5 (3) MG/3ML SOLN Take 3 mLs by nebulization every 4 (four) hours as needed. 05/14/17  Yes Pearson Grippe, MD  isosorbide dinitrate (DILATRATE-SR) 40 MG CR capsule Take 40 mg by mouth 3 (three) times daily.   Yes [provider]  Isosorbide Mononitrate (IMDUR PO) Take 0.5 mg by mouth daily. Doesn't know the dose of medication. [facility]   Yes [provider]  ketoconazole (NIZORAL) 2 % shampoo Apply 1 application topically 2 (two) times a week.   Yes [provider]  MAGNESIUM PO Take 0.5 tablets by mouth 2 (two) times  daily.   Yes [provider]  potassium chloride (K-DUR) 10 MEQ tablet Take 2 tablets (20 mEq total) by mouth daily. 04/15/17 06/21/17 Yes Osvaldo Shipper, MD  sertraline (ZOLOFT) 25 MG tablet Take 50 mg by mouth daily as needed (for anxiety).    Yes [provider]  simvastatin (ZOCOR) 80 MG tablet Take 40 mg by mouth daily.   Yes [provider]  spironolactone (ALDACTONE) 25 MG tablet Take 25 mg by mouth daily.   Yes [provider]  tamsulosin (FLOMAX) 0.4 MG CAPS capsule Take 0.4 mg by mouth at bedtime.   Yes [provider]  predniSONE (DELTASONE) 10 MG tablet 20mg  po qday x1 day then 10mg  po qday x 2 days Patient not taking: Reported on 06/21/2017 05/14/17   Pearson Grippe, MD    Family History Family History  Problem Relation Age of Onset  .  Heart Problems Maternal Grandmother   . Other Unknown        negative for premature CAD    Social History Social History  Substance Use Topics  . Smoking status: Former Smoker    Packs/day: 1.00    Years: 15.00    Types: Cigarettes, Cigars  . Smokeless tobacco: Former Neurosurgeon     Comment: quit smoking ~ 40 yr ago  . Alcohol use No     Comment: rare beer.     Allergies   Lisinopril   Review of Systems Review of Systems  A complete review of systems was obtained and all systems are negative except as noted in the HPI and PMH.    Physical Exam Updated Vital Signs BP 131/70 (BP Location: Right Arm)   Pulse 79   Temp 98.1 F (36.7 C) (Oral)   Resp 16   SpO2 97%   Physical Exam  Constitutional: He is oriented to person, place, and time. He appears well-developed and well-nourished. No distress.  Obese  HENT:  Head: Normocephalic.  Mouth/Throat: Oropharynx is clear and moist.  Eyes: Conjunctivae are normal.  Neck: Normal range of motion. No JVD present. No tracheal deviation present.  Cardiovascular: Normal rate, regular rhythm and intact distal pulses.   Radial pulse equal bilaterally   Pulmonary/Chest: Effort normal and breath sounds normal. No stridor. No respiratory distress. He has no wheezes. He has no rales. He exhibits no tenderness.  On 2 L via nasal cannula, patient reclining comfortably and speaking in complete sentences.  Abdominal: Soft. He exhibits no distension and no mass. There is no tenderness. There is no rebound and no guarding.  Musculoskeletal: Normal range of motion. He exhibits no edema or tenderness.  No calf asymmetry, superficial collaterals, palpable cords, edema, Homans sign negative bilaterally.    Neurological: He is alert and oriented to person, place, and time.  Skin: Skin is warm. He is not diaphoretic.  Psychiatric: He has a normal mood and affect.  Nursing note and vitals reviewed.    ED Treatments / Results  Labs (all labs ordered are listed, but only abnormal results are displayed) Labs Reviewed  CBC WITH DIFFERENTIAL/PLATELET - Abnormal; Notable for the following:       Result Value   RBC 4.04 (*)    Hemoglobin 12.1 (*)    HCT 37.0 (*)    All other components within normal limits  COMPREHENSIVE METABOLIC PANEL - Abnormal; Notable for the following:    Glucose, Bld 104 (*)    BUN 25 (*)    Creatinine, Ser 1.51 (*)    Albumin 3.2 (*)    GFR calc non Af Amer 42 (*)    GFR calc Af Amer 49 (*)    All other components within normal limits  BRAIN NATRIURETIC PEPTIDE - Abnormal; Notable for the following:    B Natriuretic Peptide 301.9 (*)    All other components within normal limits  TROPONIN I - Abnormal; Notable for the following:    Troponin I 0.06 (*)    All other components within normal limits  TROPONIN I - Abnormal; Notable for the following:    Troponin I 0.06 (*)    All other components within normal limits    EKG  EKG Interpretation  Date/Time:  Tuesday June 21 2017 16:06:04 EDT Ventricular Rate:  85 PR Interval:    QRS Duration: 100 QT Interval:  369 QTC Calculation: 439 R Axis:   28 Text  Interpretation:  Atrial fibrillation  Ventricular premature complex Nonspecific T abnormalities, lateral leads No significant change since last tracing Confirmed by Drema Pry [phone]) on 06/21/2017 4:20:24 PM       Radiology Dg Chest 2 View  Result Date: 06/21/2017 CLINICAL DATA:  Short of breath EXAM: CHEST  2 VIEW COMPARISON:  Chest x-ray of 06/13/2017 FINDINGS: No active infiltrate or effusion is seen. Mediastinal and hilar contours are unremarkable. Mild cardiomegaly is stable. AICD lead remains. There are degenerative changes throughout the mid to lower thoracic spine. IMPRESSION: Stable mild cardiomegaly with AICD lead.  No active process. Electronically Signed   By: Dwyane Dee M.D.   On: 06/21/2017 16:59    Procedures Procedures (including critical care time)  Medications Ordered in ED Medications  HYDROcodone-acetaminophen (NORCO/VICODIN) 5-325 MG per tablet 1 tablet (1 tablet Oral Given 06/21/17 1837)     Initial Impression / Assessment and Plan / ED Course  I have reviewed the triage vital signs and the nursing notes.  Pertinent labs & imaging results that were available during my care of the patient were reviewed by me and considered in my medical decision making (see chart for details).     Vitals:   06/21/17 1900 06/21/17 2035 06/21/17 2045 06/21/17 2140  BP: 114/79 105/79 130/69 131/70  Pulse: 79  79 79  Resp: (!) 22 18 (!) 21 16  Temp:      TempSrc:      SpO2: 100% 96% 99% 97%    Medications  HYDROcodone-acetaminophen (NORCO/VICODIN) 5-325 MG per tablet 1 tablet (1 tablet Oral Given 06/21/17 1837)    Ryan Macdonald is 83 y.o. male presenting with Alteration in breathing pattern which she can best describe as shortness of breath, no dyspnea on exertion, increasing peripheral edema. Lung sounds clear, patient with no peripheral edema. Will check chest x-ray, EKG, Pro BNP and troponin in addition to basic blood work.  EKG with atrial fibrillation and  nonspecific T-wave abnormalities, unchanged from prior.  Lab troponin 0.06, had similar levels in the past, patient is not dialysis but he does have chronic renal insufficiency with a creatinine of 1.5. Patient seen and evaluated in the room, given full dose aspirin. He is complaining about his low back pain which she takes Vicodin for. Patient given his chronic dose. He reports no chest pain he also reports complete resolution of his shortness of breath. He denies any nausea, diaphoresis.  Patient ambulated on room air with oxygen saturation remaining in the high 90s throughout, he felt comfortable and awaiting independently. Heart rate increased from 90s to 110 without discomfort or palpitations.  Delta troponin with no changes. He reports he feels improved from initial intake. Shared visit with the attending physician who agrees he is stable for discharge to home. He has an appointment with his cardiology some primary care physician at [facility] in the next several weeks. We've had an extensive discussion of return precautions and patient verbalizes understanding and teach back technique.  Evaluation does not show pathology that would require ongoing emergent intervention or inpatient treatment. Pt is hemodynamically stable and mentating appropriately. Discussed findings and plan with patient/guardian, who agrees with care plan. All questions answered. Return precautions discussed and outpatient follow up given.      Final Clinical Impressions(s) / ED Diagnoses   Final diagnoses:  SOB (shortness of breath)    New Prescriptions Discharge Medication List as of 06/21/2017  9:19 PM       Reid Nawrot, [name], PA-C 06/21/17 2320

## 2017-06-21 NOTE — ED Notes (Signed)
Pt ambulated with no assistance. SPO2 remained in high 90s throughout. HR increased to 110 from high 90s but pt maintained. Pt had no complaints. Tolerated well.

## 2017-06-21 NOTE — ED Provider Notes (Signed)
Medical screening examination/treatment/procedure(s) were conducted as a shared visit with non-physician practitioner(s) and myself.  I personally evaluated the patient during the encounter. Briefly, the patient is a 83 y.o. male with past medical history diabetes, A. fib on Eliquis, CHF who presents with change in his breathing. Patient states that he notices that he is "breathing differently." Denies any overt shortness of breath, chest pain, fatigue, recent fevers, cough, URI symptoms. Reports improving peripheral edema. Breathing is not exacerbated or relieved by anything. No other associated symptoms. Denies any other physical complaints. Workup not concerning for ACS or CHF exacerbation. Low suspicion for pulmonary embolism. Chest x-ray without acute process. He remained hemodynamically stable, without hypoxia on room air. No acute/emergent processes identified. So he is safe for discharge with strict return precautions.   EKG Interpretation  Date/Time:  Tuesday June 21 2017 16:06:04 EDT Ventricular Rate:  85 PR Interval:    QRS Duration: 100 QT Interval:  369 QTC Calculation: 439 R Axis:   28 Text Interpretation:  Atrial fibrillation Ventricular premature complex Nonspecific T abnormalities, lateral leads No significant change since last tracing Confirmed by Drema Pry [id number]) on 06/21/2017 4:20:24 PM           Eudelia Bunch Amadeo Garnet, MD 06/22/17 0100

## 2017-06-21 NOTE — ED Notes (Signed)
Patient verbalized understanding of discharge instructions and denies any further needs or questions at this time. VS stable. Patient ambulatory with steady gait. Escorted to ED entrance in wheelchair.   

## 2017-06-21 NOTE — ED Notes (Signed)
Patient transported to x-ray. ?

## 2017-06-21 NOTE — ED Triage Notes (Signed)
Per [facility]: Pt to ED from home c/o SOB - pt states that he feels like it's his CHF acting up again, has been here twice in the last couple months for same. Pt is not on O2 at home, reports that at 1:20pm, he felt like his breathing was a little off. He states he usually just needs extra Lasix and some oxygen. Pt denies CP/dizziness/N/V. EMS VS: 133/76, HR 80, R 22, 98% on 2L, CBG 125. 20g. LFA inserted PTA. Patient A&Ox4, resp e/u, skin warm/dry.

## 2017-06-21 NOTE — Discharge Instructions (Signed)
Please follow with your primary care doctor in the next 2 days for a check-up. They must obtain records for further management.  ° °Do not hesitate to return to the Emergency Department for any new, worsening or concerning symptoms.  ° °

## 2017-07-06 ENCOUNTER — Ambulatory Visit (INDEPENDENT_AMBULATORY_CARE_PROVIDER_SITE_OTHER): Payer: Non-veteran care | Admitting: Podiatry

## 2017-07-06 ENCOUNTER — Encounter: Payer: Self-pay | Admitting: Podiatry

## 2017-07-06 DIAGNOSIS — M79674 Pain in right toe(s): Secondary | ICD-10-CM

## 2017-07-06 DIAGNOSIS — E119 Type 2 diabetes mellitus without complications: Secondary | ICD-10-CM | POA: Diagnosis not present

## 2017-07-06 DIAGNOSIS — B351 Tinea unguium: Secondary | ICD-10-CM | POA: Diagnosis not present

## 2017-07-06 DIAGNOSIS — L84 Corns and callosities: Secondary | ICD-10-CM | POA: Diagnosis not present

## 2017-07-06 DIAGNOSIS — M79675 Pain in left toe(s): Secondary | ICD-10-CM | POA: Diagnosis not present

## 2017-07-06 DIAGNOSIS — E118 Type 2 diabetes mellitus with unspecified complications: Secondary | ICD-10-CM | POA: Diagnosis not present

## 2017-07-06 NOTE — Progress Notes (Signed)
Patient ID: Ryan Macdonald, male   DOB: 1934-03-04, 83 y.o.   MRN: [id number]   Subjective This patient presents today for follow-up visit  complaining of uncomfortable toenails when walking and wearing shoes, and a painful corn on the fifth right toe and requests treatment for both these areas.    Objective: Orientated 3 DP and PT pulses 2/4 bilaterally Capillary reflex immediate bilaterally Vibratory sensation intact bilaterally Sensation to 10 g monofilament wire intact 5/5 bilaterally Ankle reflex equal and reactive bilaterally No openskin lesions bilaterally Keratoses lateral fifth right toe The toenails are elongated, brittle, deformed 6-10 HAV left Manual motor testing dorsi flexion, plantar flexion 5/5 bilaterally  Assessment: Diabetic with satisfactory neurovascular status Symptomatic onychomycoses 6-10 Keratoses 1  Plan: Debrided toenails 6-10 mechanically and legs without anybleeding Debrided keratoses 1without anybleeding   Reappoint 3 months

## 2017-07-06 NOTE — Patient Instructions (Signed)

## 2017-07-08 ENCOUNTER — Emergency Department (HOSPITAL_COMMUNITY): Payer: Medicare Other

## 2017-07-08 ENCOUNTER — Inpatient Hospital Stay (HOSPITAL_COMMUNITY)
Admission: EM | Admit: 2017-07-08 | Discharge: 2017-07-10 | DRG: 291 | Disposition: A | Discharge status: Home / Self Care | Payer: Medicare Other | Attending: Internal Medicine | Admitting: Internal Medicine

## 2017-07-08 ENCOUNTER — Encounter (HOSPITAL_COMMUNITY): Payer: Self-pay | Admitting: Emergency Medicine

## 2017-07-08 DIAGNOSIS — R748 Abnormal levels of other serum enzymes: Secondary | ICD-10-CM | POA: Diagnosis not present

## 2017-07-08 DIAGNOSIS — Z79899 Other long term (current) drug therapy: Secondary | ICD-10-CM | POA: Diagnosis not present

## 2017-07-08 DIAGNOSIS — I429 Cardiomyopathy, unspecified: Secondary | ICD-10-CM | POA: Diagnosis present

## 2017-07-08 DIAGNOSIS — I428 Other cardiomyopathies: Secondary | ICD-10-CM

## 2017-07-08 DIAGNOSIS — I5023 Acute on chronic systolic (congestive) heart failure: Secondary | ICD-10-CM | POA: Diagnosis present

## 2017-07-08 DIAGNOSIS — Z888 Allergy status to other drugs, medicaments and biological substances status: Secondary | ICD-10-CM

## 2017-07-08 DIAGNOSIS — E1122 Type 2 diabetes mellitus with diabetic chronic kidney disease: Secondary | ICD-10-CM | POA: Diagnosis present

## 2017-07-08 DIAGNOSIS — I130 Hypertensive heart and chronic kidney disease with heart failure and stage 1 through stage 4 chronic kidney disease, or unspecified chronic kidney disease: Principal | ICD-10-CM | POA: Diagnosis present

## 2017-07-08 DIAGNOSIS — E669 Obesity, unspecified: Secondary | ICD-10-CM | POA: Diagnosis present

## 2017-07-08 DIAGNOSIS — I10 Essential (primary) hypertension: Secondary | ICD-10-CM | POA: Diagnosis present

## 2017-07-08 DIAGNOSIS — I4891 Unspecified atrial fibrillation: Secondary | ICD-10-CM | POA: Diagnosis present

## 2017-07-08 DIAGNOSIS — H269 Unspecified cataract: Secondary | ICD-10-CM | POA: Diagnosis present

## 2017-07-08 DIAGNOSIS — I480 Paroxysmal atrial fibrillation: Secondary | ICD-10-CM | POA: Diagnosis present

## 2017-07-08 DIAGNOSIS — I447 Left bundle-branch block, unspecified: Secondary | ICD-10-CM | POA: Diagnosis not present

## 2017-07-08 DIAGNOSIS — E119 Type 2 diabetes mellitus without complications: Secondary | ICD-10-CM | POA: Diagnosis present

## 2017-07-08 DIAGNOSIS — Z87891 Personal history of nicotine dependence: Secondary | ICD-10-CM | POA: Diagnosis not present

## 2017-07-08 DIAGNOSIS — M109 Gout, unspecified: Secondary | ICD-10-CM | POA: Diagnosis present

## 2017-07-08 DIAGNOSIS — E038 Other specified hypothyroidism: Secondary | ICD-10-CM | POA: Diagnosis not present

## 2017-07-08 DIAGNOSIS — I472 Ventricular tachycardia: Secondary | ICD-10-CM | POA: Diagnosis not present

## 2017-07-08 DIAGNOSIS — N183 Chronic kidney disease, stage 3 unspecified: Secondary | ICD-10-CM | POA: Diagnosis present

## 2017-07-08 DIAGNOSIS — Z6839 Body mass index (BMI) 39.0-39.9, adult: Secondary | ICD-10-CM | POA: Diagnosis not present

## 2017-07-08 DIAGNOSIS — Z7901 Long term (current) use of anticoagulants: Secondary | ICD-10-CM | POA: Diagnosis not present

## 2017-07-08 DIAGNOSIS — E039 Hypothyroidism, unspecified: Secondary | ICD-10-CM | POA: Diagnosis present

## 2017-07-08 DIAGNOSIS — M25562 Pain in left knee: Secondary | ICD-10-CM | POA: Diagnosis present

## 2017-07-08 DIAGNOSIS — J9601 Acute respiratory failure with hypoxia: Secondary | ICD-10-CM | POA: Diagnosis present

## 2017-07-08 DIAGNOSIS — D539 Nutritional anemia, unspecified: Secondary | ICD-10-CM | POA: Diagnosis present

## 2017-07-08 DIAGNOSIS — E785 Hyperlipidemia, unspecified: Secondary | ICD-10-CM | POA: Diagnosis present

## 2017-07-08 DIAGNOSIS — I5043 Acute on chronic combined systolic (congestive) and diastolic (congestive) heart failure: Secondary | ICD-10-CM | POA: Diagnosis present

## 2017-07-08 DIAGNOSIS — E1169 Type 2 diabetes mellitus with other specified complication: Secondary | ICD-10-CM | POA: Diagnosis present

## 2017-07-08 DIAGNOSIS — Z95810 Presence of automatic (implantable) cardiac defibrillator: Secondary | ICD-10-CM | POA: Diagnosis not present

## 2017-07-08 DIAGNOSIS — M25552 Pain in left hip: Secondary | ICD-10-CM | POA: Diagnosis present

## 2017-07-08 DIAGNOSIS — G8929 Other chronic pain: Secondary | ICD-10-CM | POA: Diagnosis present

## 2017-07-08 DIAGNOSIS — D649 Anemia, unspecified: Secondary | ICD-10-CM | POA: Diagnosis present

## 2017-07-08 DIAGNOSIS — N2889 Other specified disorders of kidney and ureter: Secondary | ICD-10-CM | POA: Diagnosis present

## 2017-07-08 DIAGNOSIS — N289 Disorder of kidney and ureter, unspecified: Secondary | ICD-10-CM

## 2017-07-08 DIAGNOSIS — M1991 Primary osteoarthritis, unspecified site: Secondary | ICD-10-CM | POA: Diagnosis present

## 2017-07-08 DIAGNOSIS — Z96642 Presence of left artificial hip joint: Secondary | ICD-10-CM | POA: Diagnosis present

## 2017-07-08 HISTORY — DX: Disorder of kidney and ureter, unspecified: N28.9

## 2017-07-08 LAB — I-STAT ARTERIAL BLOOD GAS, ED
ACID-BASE EXCESS: 2 mmol/L (ref 0.0–2.0)
BICARBONATE: 27 mmol/L (ref 20.0–28.0)
O2 SAT: 95 %
PCO2 ART: 40.8 mmHg (ref 32.0–48.0)
PO2 ART: 73 mmHg — AB (ref 83.0–108.0)
Patient temperature: 98.1
TCO2: 28 mmol/L (ref 0–100)
pH, Arterial: 7.427 (ref 7.350–7.450)

## 2017-07-08 LAB — COMPREHENSIVE METABOLIC PANEL
ALT: 24 U/L (ref 17–63)
AST: 36 U/L (ref 15–41)
Albumin: 3.4 g/dL — ABNORMAL LOW (ref 3.5–5.0)
Alkaline Phosphatase: 59 U/L (ref 38–126)
Anion gap: 12 (ref 5–15)
BUN: 20 mg/dL (ref 6–20)
CHLORIDE: 101 mmol/L (ref 101–111)
CO2: 27 mmol/L (ref 22–32)
CREATININE: 1.44 mg/dL — AB (ref 0.61–1.24)
Calcium: 9.2 mg/dL (ref 8.9–10.3)
GFR calc non Af Amer: 44 mL/min — ABNORMAL LOW (ref >60)
GFR, EST AFRICAN AMERICAN: 51 mL/min — AB (ref >60)
Glucose, Bld: 223 mg/dL — ABNORMAL HIGH (ref 65–99)
POTASSIUM: 3.6 mmol/L (ref 3.5–5.1)
SODIUM: 140 mmol/L (ref 135–145)
Total Bilirubin: 0.6 mg/dL (ref 0.3–1.2)
Total Protein: 7.5 g/dL (ref 6.5–8.1)

## 2017-07-08 LAB — CBC WITH DIFFERENTIAL/PLATELET
Basophils Absolute: 0 10*3/uL (ref 0.0–0.1)
Basophils Relative: 0 %
EOS ABS: 0.3 10*3/uL (ref 0.0–0.7)
Eosinophils Relative: 3 %
HEMATOCRIT: 37.6 % — AB (ref 39.0–52.0)
HEMOGLOBIN: 12.5 g/dL — AB (ref 13.0–17.0)
LYMPHS ABS: 2.2 10*3/uL (ref 0.7–4.0)
LYMPHS PCT: 28 %
MCH: 30.9 pg (ref 26.0–34.0)
MCHC: 33.2 g/dL (ref 30.0–36.0)
MCV: 93.1 fL (ref 78.0–100.0)
MONOS PCT: 6 %
Monocytes Absolute: 0.5 10*3/uL (ref 0.1–1.0)
NEUTROS ABS: 5.1 10*3/uL (ref 1.7–7.7)
NEUTROS PCT: 63 %
Platelets: 254 10*3/uL (ref 150–400)
RBC: 4.04 MIL/uL — ABNORMAL LOW (ref 4.22–5.81)
RDW: 14.6 % (ref 11.5–15.5)
WBC: 8.1 10*3/uL (ref 4.0–10.5)

## 2017-07-08 LAB — I-STAT TROPONIN, ED: Troponin i, poc: 0.05 ng/mL (ref 0.00–0.08)

## 2017-07-08 LAB — BRAIN NATRIURETIC PEPTIDE: B NATRIURETIC PEPTIDE 5: 416.3 pg/mL — AB (ref 0.0–100.0)

## 2017-07-08 LAB — TROPONIN I
Troponin I: 0.06 ng/mL (ref <0.03)
Troponin I: 0.06 ng/mL (ref <0.03)
Troponin I: 0.06 ng/mL (ref <0.03)

## 2017-07-08 LAB — TSH: TSH: 6.935 u[IU]/mL — ABNORMAL HIGH (ref 0.350–4.500)

## 2017-07-08 LAB — CBG MONITORING, ED: Glucose-Capillary: 110 mg/dL — ABNORMAL HIGH (ref 65–99)

## 2017-07-08 LAB — GLUCOSE, CAPILLARY
GLUCOSE-CAPILLARY: 92 mg/dL (ref 65–99)
Glucose-Capillary: 139 mg/dL — ABNORMAL HIGH (ref 65–99)

## 2017-07-08 MED ORDER — POTASSIUM CHLORIDE CRYS ER 20 MEQ PO TBCR
40.0000 meq | EXTENDED_RELEASE_TABLET | Freq: Every day | ORAL | Status: DC
  Administered 2017-07-08 – 2017-07-10 (×3): 40 meq via ORAL
  Filled 2017-07-08 (×3): qty 2

## 2017-07-08 MED ORDER — ALLOPURINOL 100 MG PO TABS
200.0000 mg | ORAL_TABLET | Freq: Every day | ORAL | Status: DC
  Administered 2017-07-08 – 2017-07-10 (×3): 200 mg via ORAL
  Filled 2017-07-08 (×4): qty 2

## 2017-07-08 MED ORDER — INSULIN ASPART 100 UNIT/ML ~~LOC~~ SOLN
0.0000 [IU] | Freq: Three times a day (TID) | SUBCUTANEOUS | Status: DC
  Administered 2017-07-09: 1 [IU] via SUBCUTANEOUS

## 2017-07-08 MED ORDER — NITROGLYCERIN 2 % TD OINT
1.0000 [in_us] | TOPICAL_OINTMENT | Freq: Once | TRANSDERMAL | Status: AC
  Administered 2017-07-08: 1 [in_us] via TOPICAL
  Filled 2017-07-08: qty 1

## 2017-07-08 MED ORDER — FINASTERIDE 5 MG PO TABS
5.0000 mg | ORAL_TABLET | Freq: Every day | ORAL | Status: DC
  Administered 2017-07-08 – 2017-07-10 (×3): 5 mg via ORAL
  Filled 2017-07-08 (×3): qty 1

## 2017-07-08 MED ORDER — ACETAMINOPHEN 325 MG PO TABS
650.0000 mg | ORAL_TABLET | Freq: Four times a day (QID) | ORAL | Status: DC | PRN
  Administered 2017-07-08 – 2017-07-09 (×2): 650 mg via ORAL
  Filled 2017-07-08 (×2): qty 2

## 2017-07-08 MED ORDER — ACETAMINOPHEN 650 MG RE SUPP: 650.0000 mg | Freq: Four times a day (QID) | RECTAL | Status: DC | PRN

## 2017-07-08 MED ORDER — SPIRONOLACTONE 25 MG PO TABS
25.0000 mg | ORAL_TABLET | Freq: Every day | ORAL | Status: DC
  Administered 2017-07-08 – 2017-07-10 (×3): 25 mg via ORAL
  Filled 2017-07-08 (×3): qty 1

## 2017-07-08 MED ORDER — MORPHINE SULFATE (PF) 4 MG/ML IV SOLN
4.0000 mg | Freq: Once | INTRAVENOUS | Status: AC
  Administered 2017-07-08: 4 mg via INTRAVENOUS
  Filled 2017-07-08: qty 1

## 2017-07-08 MED ORDER — FUROSEMIDE 10 MG/ML IJ SOLN
40.0000 mg | Freq: Once | INTRAMUSCULAR | Status: AC
  Administered 2017-07-08: 40 mg via INTRAVENOUS
  Filled 2017-07-08: qty 4

## 2017-07-08 MED ORDER — FUROSEMIDE 10 MG/ML IJ SOLN
40.0000 mg | Freq: Once | INTRAMUSCULAR | Status: AC
  Administered 2017-07-08: 40 mg via INTRAVENOUS

## 2017-07-08 MED ORDER — FUROSEMIDE 10 MG/ML IJ SOLN
80.0000 mg | Freq: Two times a day (BID) | INTRAMUSCULAR | Status: DC
  Administered 2017-07-08 – 2017-07-09 (×3): 80 mg via INTRAVENOUS
  Filled 2017-07-08 (×3): qty 8

## 2017-07-08 MED ORDER — APIXABAN 5 MG PO TABS
5.0000 mg | ORAL_TABLET | Freq: Two times a day (BID) | ORAL | Status: DC
  Administered 2017-07-08 – 2017-07-10 (×5): 5 mg via ORAL
  Filled 2017-07-08 (×5): qty 1

## 2017-07-08 MED ORDER — FUROSEMIDE 10 MG/ML IJ SOLN: 40.0000 mg | Freq: Two times a day (BID) | INTRAMUSCULAR | Status: DC

## 2017-07-08 MED ORDER — ASPIRIN EC 81 MG PO TBEC
81.0000 mg | DELAYED_RELEASE_TABLET | Freq: Every day | ORAL | Status: DC
  Administered 2017-07-08 – 2017-07-10 (×3): 81 mg via ORAL
  Filled 2017-07-08 (×3): qty 1

## 2017-07-08 MED ORDER — DICLOFENAC SODIUM 1 % TD GEL
4.0000 g | Freq: Four times a day (QID) | TRANSDERMAL | Status: DC
  Administered 2017-07-08 – 2017-07-10 (×7): 4 g via TOPICAL
  Filled 2017-07-08: qty 100

## 2017-07-08 MED ORDER — FUROSEMIDE 10 MG/ML IJ SOLN
40.0000 mg | Freq: Once | INTRAMUSCULAR | Status: DC
  Filled 2017-07-08: qty 4

## 2017-07-08 MED ORDER — HYDRALAZINE HCL 25 MG PO TABS
25.0000 mg | ORAL_TABLET | Freq: Three times a day (TID) | ORAL | Status: DC
  Administered 2017-07-08 – 2017-07-10 (×7): 25 mg via ORAL
  Filled 2017-07-08 (×7): qty 1

## 2017-07-08 MED ORDER — ATORVASTATIN CALCIUM 40 MG PO TABS
40.0000 mg | ORAL_TABLET | Freq: Every day | ORAL | Status: DC
  Administered 2017-07-08 – 2017-07-09 (×2): 40 mg via ORAL
  Filled 2017-07-08 (×3): qty 1

## 2017-07-08 MED ORDER — TAMSULOSIN HCL 0.4 MG PO CAPS
0.4000 mg | ORAL_CAPSULE | Freq: Every day | ORAL | Status: DC
  Administered 2017-07-08 – 2017-07-09 (×2): 0.4 mg via ORAL
  Filled 2017-07-08 (×2): qty 1

## 2017-07-08 MED ORDER — CARVEDILOL 6.25 MG PO TABS
6.2500 mg | ORAL_TABLET | Freq: Two times a day (BID) | ORAL | Status: DC
  Administered 2017-07-08 – 2017-07-10 (×5): 6.25 mg via ORAL
  Filled 2017-07-08: qty 1
  Filled 2017-07-08: qty 2
  Filled 2017-07-08 (×2): qty 1
  Filled 2017-07-08: qty 2

## 2017-07-08 MED ORDER — SODIUM CHLORIDE 0.9% FLUSH
3.0000 mL | Freq: Two times a day (BID) | INTRAVENOUS | Status: DC
  Administered 2017-07-08 – 2017-07-10 (×5): 3 mL via INTRAVENOUS

## 2017-07-08 NOTE — H&P (Signed)
Date: 07/08/2017               Patient Name:  Ryan Macdonald MRN: [id number]  DOB: 1934/11/03 Age / Sex: 83 y.o., male   PCP: [facility]         Medical Service: Internal Medicine Teaching Service         Attending Physician: Dr. Cephas Darby MD    First Contact: Dr. Renaldo Reel Pager: [phone]  Second Contact: Dr. Karma Greaser Pager: [phone]       After Hours (After 5p/  First Contact Pager: [phone]  weekends / holidays): Second Contact Pager: [phone]   Chief Complaint: Heart failure exacerbation  History of Present Illness:  [name] is a 83 year old gentleman with PMH of NICM, HFrEF (EF 15-20% by TTE 01/2017), ICD placement, Paroxysmal Atrial Fibrillation on Eliquis, HTN, CKD Stage 2/3, HLD, Osteoarthritis s/p Left Total Hip Arthroplasty, Gout, and Obesity who presents to the ED with complaint of dyspnea. He is followed by the [facility] for his primary care and cardiology management. When seen in the ED, patient is on Bipap however provides full history on his own.  Patient states he was in his usual state of health until this morning around 5:30 am when he felt short of breath while awake and resting in bed. He normally sleeps with his head elevated on 2 pillows, but does not report any change in his orthopnea. He quantifies his ability to walk before becoming short of breath as "to the back of [employer]." He says he does not think his ability to ambulate has changed from his baseline and reports being able to walk without any assistive devices. He checks his weights daily, with usual weights between 249-252 lbs. His weight at home this morning was 255 lbs per patient report. He reports normally taking Lasix 20 mg tablets (3.5 tabs AM and 2 tabs PM) without any missed doses. He took an extra Lasix without significant relief and decided to present to the ED due to concern for exacerbation of his heart failure. He reports adherence to his other medications. He has had  multiple admissions and ED visits for the same. He has an ICD in place and says this has not gone off.  He also reports left knee pain that feels different than his usual gout flare ups (which usually occur in his feet). He reports adherence to his Eliquis twice daily and denies any known history of blood clots. He otherwise denies any fevers, chills, diaphoresis, headache, lightheadedness, dizziness, change in vision, chest pain, palpitations, cough, abdominal pain, nausea, vomiting, diarrhea, constipation, dysuria, myalgias, falls, injuries, epistaxis, hemoptysis, hematemesis, hematochezia, melena, hematuria, or recent illness.  Prior to arrival, he was noted to be in respiratory distress. SpO2 was 81% by EMS on RA and he was placed on CPAP (per documentation) and given 2 sublingual nitroglycerin en route. In the ED, initial vitals were: BP 154/94, Pulse 104, Temp 98.3 F, RR 34, SpO2 95% (on Bipap).  Initial lab work was notable for: BNP 416 (prior values 240 - 370) Point-of-care Troponin 0.05 WBC 8.1, Hgb 12.5 (baseline 11-12), MCV 93, Platelets 254 Creatinine 1.44 (baseline ~ 1.3) ABG pH 7.427; pCO2 40.8; pO2 73.   CXR was consistent with interstitial pulmonary edema. He was given 40 mg IV lasix, topical NTG ointment, and 4 mg IV Morphine. IMTS were then called for admission.   Meds:  Current Meds  Medication Sig  . allopurinol (ZYLOPRIM) 100 MG  tablet Take 200 mg by mouth daily.  Marland Kitchen apixaban (ELIQUIS) 5 MG TABS tablet Take 1 tablet (5 mg total) by mouth 2 (two) times daily.  . carvedilol (COREG) 12.5 MG tablet Take 0.5 tablets (6.25 mg total) by mouth 2 (two) times daily with a meal.     Allergies: Allergies as of 07/08/2017 - Review Complete 07/08/2017  Allergen Reaction Noted  . Lisinopril Swelling 10/11/2013   Past Medical History:  Diagnosis Date  . Cat scratch fever    removed mass on right side neck  . Cataracts, bilateral   . CHF (congestive heart failure) (HCC)   .  Diabetes mellitus without complication (HCC)    TYPE 2  . High cholesterol   . Hypertension   . Kidney stones   . Morbid obesity (HCC)   . Nonischemic cardiomyopathy (HCC)    a. ? 2009 Cath in [place] - nl cors per pt;  b. 09/2013 Echo: EF 25-30%, sev diff HK.  . Osteoarthritis   . PAF (paroxysmal atrial fibrillation) (HCC)    a. post-op hip in 2014 - prev on xarelto.  . Shortness of breath dyspnea     Family History:  Family History  Problem Relation Age of Onset  . Heart disease Mother   . Heart Problems Maternal Grandmother   . Other Unknown        negative for premature CAD    Social History:  Lives in [place] with wife. Moved from [place] about 5 years ago. Social History   Social History  . Marital status: Married    Spouse name: N/A  . Number of children: N/A  . Years of education: N/A   Occupational History  . Retired     Former Public house manager   Social History Main Topics  . Smoking status: Former Smoker    Packs/day: 1.00    Years: 15.00    Types: Cigarettes, Cigars  . Smokeless tobacco: Former Neurosurgeon     Comment: quit smoking ~ 50 yr ago  . Alcohol use No     Comment: rare beer.  . Drug use: No  . Sexual activity: Yes   Other Topics Concern  . None   Social History Narrative   Lives in [place] with wife. Moved from [place] (2013). Does not routinely exercise.     Review of Systems: A complete ROS was negative except as per HPI.   Physical Exam: Blood pressure 115/79, pulse 75, temperature 98.3 F (36.8 C), temperature source Axillary, resp. rate 18, height 5\' 7"  (1.702 m), weight 240 lb (108.9 kg), SpO2 97 %. Physical Exam  Constitutional: He is oriented to person, place, and time. He appears well-developed and well-nourished. No distress.  Obese male  HENT:  Head: Normocephalic and atraumatic.  Wearing Bipap on my exam.  Eyes: EOM are normal.  Neck: Neck supple.  JVD not adequately assessed due to body habitus.  Cardiovascular: Normal rate  and regular rhythm.  Exam reveals no friction rub.   No murmur heard. Distant heart sounds  Pulmonary/Chest: Effort normal. No respiratory distress. He has no wheezes.  Distant breath sounds, no obvious rales on auscultation. Speaking clearly in complete sentences without issue.  Abdominal: Soft. Bowel sounds are normal. He exhibits no distension. There is no tenderness. There is no guarding.  Obese abdomen, no ascites.  Musculoskeletal:  +1 pitting edema up to tibial tuberosities bilaterally. Lower extremities non-tender. Left knee with mild soft tissue swelling, no erythema, tenderness, or rash. RLE strength 5/5  with hip flexion and knee extension. LLE 4/5 with hip flexion and knee extension.  Neurological: He is alert and oriented to person, place, and time.  Skin: Skin is warm. He is not diaphoretic. No erythema.  Psychiatric: He has a normal mood and affect.     EKG: personally reviewed my interpretation is Sinus rhythm, rate 90 bpm, low voltage, prolonged QT, PVCs.   CXR: personally reviewed my interpretation is cardiomegaly, interstitial pulmonary edema, ICD left chest wall, no consolidation.  Assessment & Plan by Problem: Principal Problem:   Acute on chronic systolic CHF (congestive heart failure) (HCC) Active Problems:   Nonischemic cardiomyopathy (HCC)   Obesity (BMI 30-39.9)   Essential hypertension   Hyperlipidemia   Atrial fibrillation (HCC)   CKD (chronic kidney disease), stage III   ICD (implantable cardioverter-defibrillator) in place   Diabetes mellitus type 2 in obese [facility])   Left knee pain  Ryan Macdonald is a 83 year old gentleman with PMH of NICM, HFrEF (EF 15-20% by TTE 01/2017), ICD placement, Paroxysmal Atrial Fibrillation on Eliquis, HTN, CKD Stage 2/3, HLD, Osteoarthritis, Gout, and Obesity who presents to the ED with acute on chronic HFrEF exacerbation.  Acute on Chronic Systolic CHF Exacerbation NICM S/p ICD Placement: Patient with acute onset of dyspnea and 3  lb weight gain in 1 day with associated hypoxia. He initially required Bipap which did provide relief. His presentation is consistent with and similar to his prior heart failure exacerbations, although he does not describe change in orthopnea or dyspnea on exertion. His BNP was mildly elevated at 416 which is higher than his prior values. It is unclear what the inciting factor for his symptoms are as he reports adherence to his medications, no change in diet, or recent illness. PE is also in the differential, although he is already on anticoagulation with Eliquis and reports adherence. He follows with [facility] cardiology; we are not able to view those records in [facility], however he reportedly has a non-ischemic cardiomyopaty. Would consider amyloidosis in this patient with NICM although his hypertension and obesity may explain his cardiomyopathy. When seen in the ED, he reported symptomatic improvement and was in no respiratory distress. He has subsequently been taken off Bipap and is saturating well on 1 L O2 via . Will admit to telemetry for further management. - Admit to telemetry - Repeat Lasix 40 mg IV now, then IV 80 mg beginning this evening - Supplement Potassium 40 mEq daily - monitor electrolytes, check Mag in AM - Strict I/Os, Daily standing weights - Repeat EKG in AM - Trend Troponins - Continue home Carvedilol 6.25 mg BID, Spironolactone 25 mg qd - No ACE-I due to history of facial swelling - ASA 81 mg daily  OA s/p Left Total Hip Arthroplasty with Left Knee Pain: Patient reports left knee pain which seems to be occurring over several days. Exam and history does not seem consistent with previous gouty arthritis flare ups. This may be secondary from gait change associated with his chronic left hip pain. Will continue to monitor and manage pain. - Tylenol prn - Voltaren gel  Paroxysmal Atrial Fibrillation: Currently in sinus rhythm. He reports adherence to home Eliquis without missed  doses or obvious bleeding. His CHA2DS2-VASc score is at least 5 with a yearly stroke risk of 6.7%. - Continue Eliquis 5 mg BID  T2DM: Last A1c on file 6.3% on 02/03/17 - SSI-S  HTN: BP stable. - Continue home Carvedilol 6.25 mg BID, Hydralazine 25 mg TID, Spironolactone  25 mg daily, Lasix as above  HLD: - Continue home Atorvastatin 40 mg daily  Gout: Left knee exam not consistent with acute gout flare.  - Continue home Allopurinol 200 mg daily  Code Status: FULL CODE confirmed with patient   Dispo: Admit patient to Inpatient with expected length of stay greater than 2 midnights.  Signed: Darreld Mclean, MD 07/08/2017, 11:48 AM

## 2017-07-08 NOTE — ED Notes (Signed)
Pt taken off bipap , placed on 2 liters O2 n/c

## 2017-07-08 NOTE — ED Triage Notes (Signed)
Pt brought to ED by [facility] from home for respiratory distress since he woke up today, hx of CHF. SPO2 81 on RA, placed on CPAP by EMS SPO2 up to 90% 2 Nitro sl given by ems pta, BP 176/96, HR 110, R-30.

## 2017-07-08 NOTE — ED Notes (Signed)
Dr. Vicente Masson paged with critical troponin result.

## 2017-07-08 NOTE — ED Notes (Signed)
Pt tolerating 2L [place] well. Able to speak in complete sentences and in NAD. VSS

## 2017-07-08 NOTE — ED Provider Notes (Signed)
[facility]-EMERGENCY DEPT Provider Note   CSN: [id number] Arrival date & time: 07/08/17  4098     History   Chief Complaint Chief Complaint  Patient presents with  . Shortness of Breath    HPI Ryan Macdonald is a 83 y.o. male.  The history is provided by the patient and the EMS personnel. The history is limited by the condition of the patient (Severity of condition, currently on BiPAP).  He woke up at 0530 complaining of severe respiratory distress. He denies chest pain, heaviness, tightness, pressure. He denies cough. Denies fever or chills. He has had similar difficulties before. He does have a history of combined systolic and diastolic heart failure. He states he had no difficulty with breathing yesterday or last night. He was brought in by ambulance. EMS noted oxygen saturation of 81% on room air. He was placed on CPAP which brought his oxygen saturation up to 90%. He also received 2 sublingual nitroglycerin tablets.  Past Medical History:  Diagnosis Date  . Cat scratch fever    removed mass on right side neck  . Cataracts, bilateral   . CHF (congestive heart failure) (HCC)   . Diabetes mellitus without complication (HCC)    TYPE 2  . High cholesterol   . Hypertension   . Kidney stones   . Morbid obesity (HCC)   . Nonischemic cardiomyopathy (HCC)    a. ? 2009 Cath in [place] - nl cors per pt;  b. 09/2013 Echo: EF 25-30%, sev diff HK.  . Osteoarthritis   . PAF (paroxysmal atrial fibrillation) (HCC)    a. post-op hip in 2014 - prev on xarelto.  . Shortness of breath dyspnea     Patient Active Problem List   Diagnosis Date Noted  . Acute bronchitis 05/10/2017  . Acute congestive heart failure (HCC) 04/14/2017  . Elevated troponin 04/14/2017  . Lactic acidosis   . Acute on chronic systolic heart failure, NYHA class 2 (HCC) 02/03/2017  . Diabetes mellitus type 2 in obese (HCC) 02/03/2017  . CAP (community acquired pneumonia) 02/03/2017  . Lobar pneumonia (HCC)   . Diabetes  mellitus with complication (HCC)   . Acute on chronic respiratory failure with hypoxia (HCC)   . Acute on chronic combined systolic and diastolic CHF (congestive heart failure) (HCC) 05/19/2016  . Depression 05/19/2016  . Gout 05/19/2016  . 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 NSVT (nonsustained ventricular tachycardia) (HCC)   . CKD (chronic kidney disease), stage III   . Pulmonary embolism (HCC)   . Acute respiratory failure with hypoxia (HCC)   . Osteoarthritis of left hip 10/25/2013  . Atrial fibrillation (HCC) 10/25/2013  . S/P hip replacement 10/24/2013  . Nonischemic cardiomyopathy (HCC)   . Obesity (BMI 30-39.9)   . Essential hypertension   . Hyperlipidemia     Past Surgical History:  Procedure Laterality Date  . CARDIAC CATHETERIZATION  1980's  . CIRCUMCISION  1940's  . CYSTOSCOPY WITH URETHRAL DILATATION  10/23/2013  . CYSTOSCOPY WITH URETHRAL DILATATION N/A 10/23/2013   Procedure: CYSTOSCOPY WITH URETHRAL DILATATION;  Surgeon: Kathi Ludwig, MD;  Location: [facility] OR;  Service: Urology;  Laterality: N/A;  . INCISION AND DRAINAGE OF WOUND Right 1980's   "cat scratch" (10/23/2013)  . INGUINAL HERNIA REPAIR Right 1950's  . KIDNEY STONE SURGERY Right 1970's; 1983   "twice"  . TONSILLECTOMY AND ADENOIDECTOMY  1940's  . TOTAL HIP ARTHROPLASTY Left 10/23/2013  . TOTAL HIP ARTHROPLASTY Left 10/23/2013   Procedure: TOTAL HIP  ARTHROPLASTY;  Surgeon: Valeria Batman, MD;  Location: [facility] OR;  Service: Orthopedics;  Laterality: Left;       Home Medications    Prior to Admission medications   Medication Sig Start Date End Date Taking? Authorizing Provider  allopurinol (ZYLOPRIM) 100 MG tablet Take 200 mg by mouth daily.    [provider]  apixaban (ELIQUIS) 5 MG TABS tablet Take 1 tablet (5 mg total) by mouth 2 (two) times daily. 02/06/17   Amin, Loura Halt, MD  carvedilol (COREG) 12.5 MG tablet Take 0.5 tablets (6.25 mg total) by  mouth 2 (two) times daily with a meal. 04/15/17   Osvaldo Shipper, MD  cyanocobalamin 1000 MCG tablet Take 1,000 mcg by mouth daily.     [provider]  diphenhydrAMINE (BENADRYL) 25 mg capsule Take 25 mg by mouth at bedtime as needed for sleep.    [provider]  finasteride (PROSCAR) 5 MG tablet Take 5 mg by mouth daily.    [provider]  furosemide (LASIX) 20 MG tablet Take 3 tablets (60 mg total) by mouth 2 (two) times daily. 04/15/17   Osvaldo Shipper, MD  glipiZIDE (GLUCOTROL) 5 MG tablet Take 5 mg by mouth daily before breakfast.    [provider]  guaifenesin (ROBITUSSIN) 100 MG/5ML syrup Take 100 mg by mouth 3 (three) times daily as needed for cough.    [provider]  hydrALAZINE (APRESOLINE) 25 MG tablet Take 25 mg by mouth 3 (three) times daily.    [provider]  HYDROcodone-acetaminophen (NORCO/VICODIN) 5-325 MG tablet Take 2 tablets by mouth every 4 (four) hours as needed. Patient taking differently: Take 1 tablet by mouth 2 (two) times daily.  04/09/16   Isa Rankin, MD  ipratropium-albuterol (DUONEB) 0.5-2.5 (3) MG/3ML SOLN Take 3 mLs by nebulization every 4 (four) hours as needed. 05/14/17   Pearson Grippe, MD  isosorbide dinitrate (DILATRATE-SR) 40 MG CR capsule Take 40 mg by mouth 3 (three) times daily.    [provider]  Isosorbide Mononitrate (IMDUR PO) Take 0.5 mg by mouth daily. Doesn't know the dose of medication. [facility].    [provider]  ketoconazole (NIZORAL) 2 % shampoo Apply 1 application topically 2 (two) times a week.    [provider]  MAGNESIUM PO Take 0.5 tablets by mouth 2 (two) times daily.    [provider]  potassium chloride (K-DUR) 10 MEQ tablet Take 2 tablets (20 mEq total) by mouth daily. 04/15/17 06/21/17  Osvaldo Shipper, MD  predniSONE (DELTASONE) 10 MG tablet 20mg  po qday x1 day then 10mg  po qday x 2 days Patient not taking: Reported on 06/21/2017 05/14/17    Pearson Grippe, MD  sertraline (ZOLOFT) 25 MG tablet Take 50 mg by mouth daily as needed (for anxiety).     [provider]  simvastatin (ZOCOR) 80 MG tablet Take 40 mg by mouth daily.    [provider]  spironolactone (ALDACTONE) 25 MG tablet Take 25 mg by mouth daily.    [provider]  tamsulosin (FLOMAX) 0.4 MG CAPS capsule Take 0.4 mg by mouth at bedtime.    [provider]    Family History Family History  Problem Relation Age of Onset  . Heart Problems Maternal Grandmother   . Other Unknown        negative for premature CAD    Social History Social History  Substance Use Topics  . Smoking status: Former Smoker  Packs/day: 1.00    Years: 15.00    Types: Cigarettes, Cigars  . Smokeless tobacco: Former Neurosurgeon     Comment: quit smoking ~ 40 yr ago  . Alcohol use No     Comment: rare beer.     Allergies   Lisinopril   Review of Systems Review of Systems  Unable to perform ROS: Severe respiratory distress     Physical Exam Updated Vital Signs BP (!) 154/94 (BP Location: Right Arm)   Pulse 98   Temp 98.3 F (36.8 C) (Axillary)   Resp (!) 34   Ht 5\' 7"  (1.702 m)   Wt 108.9 kg (240 lb)   SpO2 95%   BMI 37.59 kg/m   Physical Exam  Nursing note and vitals reviewed.  83 year old male on BiPAP. Vital signs are significant for tachypnea and hypertension. Oxygen saturation is 95%, which is normal, but only because he is maintained on BiPAP. Head is normocephalic and atraumatic. PERRLA, EOMI. Oropharynx is clear. Neck is nontender and supple without adenopathy or JVD. Back is nontender and there is no CVA tenderness. Lungs have coarse rhonchi diffusely, and bibasilar rales. Chest is nontender. Heart has regular rate and rhythm without murmur. Abdomen is soft, flat, nontender without masses or hepatosplenomegaly and peristalsis is normoactive. Extremities have 2+ edema, full range of motion is present. Skin is warm and dry  without rash. Neurologic: Mental status is normal, cranial nerves are intact, there are no motor or sensory deficits.  ED Treatments / Results  Labs (all labs ordered are listed, but only abnormal results are displayed) Labs Reviewed  BRAIN NATRIURETIC PEPTIDE - Abnormal; Notable for the following:       Result Value   B Natriuretic Peptide 416.3 (*)    All other components within normal limits  COMPREHENSIVE METABOLIC PANEL - Abnormal; Notable for the following:    Glucose, Bld 223 (*)    Creatinine, Ser 1.44 (*)    Albumin 3.4 (*)    GFR calc non Af Amer 44 (*)    GFR calc Af Amer 51 (*)    All other components within normal limits  CBC WITH DIFFERENTIAL/PLATELET - Abnormal; Notable for the following:    RBC 4.04 (*)    Hemoglobin 12.5 (*)    HCT 37.6 (*)    All other components within normal limits  I-STAT ARTERIAL BLOOD GAS, ED - Abnormal; Notable for the following:    pO2, Arterial 73.0 (*)    All other components within normal limits  I-STAT TROPONIN, ED  I-STAT TROPONIN, ED    EKG  EKG Interpretation  Date/Time:  Friday July 08 2017 06:33:07 EDT Ventricular Rate:  92 PR Interval:    QRS Duration: 101 QT Interval:  525 QTC Calculation: 643 R Axis:   48 Text Interpretation:  Sinus rhythm Multiform ventricular premature complexes Short PR interval Low voltage, precordial leads Borderline repolarization abnormality Prolonged QT interval When compared with ECG of 06/21/2017, Sinus rhythm has replaced Atrial fibrillation QT has lengthened Confirmed by Dione Booze ([id number]) on 07/08/2017 6:44:01 AM Also confirmed by Dione Booze ([id number]), editor Elita Quick [phone])  on 07/08/2017 7:28:30 AM       Radiology Dg Chest Port 1 View  Result Date: 07/08/2017 CLINICAL DATA:  Acute onset of shortness of breath. Initial encounter. EXAM: PORTABLE CHEST 1 VIEW COMPARISON:  Chest radiograph performed 06/21/2017 FINDINGS: The lungs are well-aerated. Mild vascular congestion is  noted. Mildly increased interstitial markings raise concern for mild pulmonary  edema. There is no evidence of pleural effusion or pneumothorax. The cardiomediastinal silhouette is mildly enlarged. An AICD is noted overlying the left chest wall, with a single lead ending overlying the right ventricle. No acute osseous abnormalities are seen. IMPRESSION: Mild vascular congestion and mild cardiomegaly. Increased interstitial markings raise concern for mild interstitial edema. Electronically Signed   By: Roanna Raider M.D.   On: 07/08/2017 06:45    Procedures Procedures (including critical care time) CRITICAL CARE Performed by: BJYNW,GNFAO Total critical care time: 60 minutes Critical care time was exclusive of separately billable procedures and treating other patients. Critical care was necessary to treat or prevent imminent or life-threatening deterioration. Critical care was time spent personally by me on the following activities: development of treatment plan with patient and/or surrogate as well as nursing, discussions with consultants, evaluation of patient's response to treatment, examination of patient, obtaining history from patient or surrogate, ordering and performing treatments and interventions, ordering and review of laboratory studies, ordering and review of radiographic studies, pulse oximetry and re-evaluation of patient's condition.   Medications Ordered in ED Medications  morphine 4 MG/ML injection 4 mg (not administered)  furosemide (LASIX) injection 40 mg (40 mg Intravenous Given 07/08/17 0631)  nitroGLYCERIN (NITROGLYN) 2 % ointment 1 inch (1 inch Topical Given 07/08/17 0631)     Initial Impression / Assessment and Plan / ED Course  I have reviewed the triage vital signs and the nursing notes.  Pertinent labs & imaging results that were available during my care of the patient were reviewed by me and considered in my medical decision making (see chart for details).  Acute  dyspnea which appears to be exacerbation of combined systolic and diastolic heart failure. He is given a dose of furosemide and placed on topical nitrates. Old records are reviewed, and he did have several hospital admissions for heart failure-most recently in June.  He has had modest diuresis. However, he is clearly breathing much better. Is complaining of some joint pains and is given morphine which did help his pulmonary edema and his pain. Laboratory workup shows modest elevation of BNP over baseline. He has a chronic anemia which is unchanged from baseline, chronic renal insufficiency which is also unchanged from baseline. Case is discussed with Dr. Karma Greaser of internal medicine teaching service, who agrees to admit the patient.  Final Clinical Impressions(s) / ED Diagnoses   Final diagnoses:  Acute on chronic combined systolic (congestive) and diastolic (congestive) heart failure (HCC)  Renal insufficiency  Normochromic normocytic anemia    New Prescriptions New Prescriptions   No medications on file     Dione Booze, MD 07/08/17 [phone]

## 2017-07-09 ENCOUNTER — Encounter (HOSPITAL_COMMUNITY): Payer: Self-pay | Admitting: Oncology

## 2017-07-09 DIAGNOSIS — M109 Gout, unspecified: Secondary | ICD-10-CM

## 2017-07-09 DIAGNOSIS — I480 Paroxysmal atrial fibrillation: Secondary | ICD-10-CM

## 2017-07-09 DIAGNOSIS — Z7901 Long term (current) use of anticoagulants: Secondary | ICD-10-CM

## 2017-07-09 DIAGNOSIS — E1122 Type 2 diabetes mellitus with diabetic chronic kidney disease: Secondary | ICD-10-CM

## 2017-07-09 DIAGNOSIS — Z95810 Presence of automatic (implantable) cardiac defibrillator: Secondary | ICD-10-CM

## 2017-07-09 DIAGNOSIS — I130 Hypertensive heart and chronic kidney disease with heart failure and stage 1 through stage 4 chronic kidney disease, or unspecified chronic kidney disease: Principal | ICD-10-CM

## 2017-07-09 DIAGNOSIS — Z87891 Personal history of nicotine dependence: Secondary | ICD-10-CM

## 2017-07-09 DIAGNOSIS — M25562 Pain in left knee: Secondary | ICD-10-CM

## 2017-07-09 DIAGNOSIS — N289 Disorder of kidney and ureter, unspecified: Secondary | ICD-10-CM

## 2017-07-09 DIAGNOSIS — Z888 Allergy status to other drugs, medicaments and biological substances status: Secondary | ICD-10-CM

## 2017-07-09 DIAGNOSIS — J9601 Acute respiratory failure with hypoxia: Secondary | ICD-10-CM

## 2017-07-09 DIAGNOSIS — Z8249 Family history of ischemic heart disease and other diseases of the circulatory system: Secondary | ICD-10-CM

## 2017-07-09 DIAGNOSIS — I5023 Acute on chronic systolic (congestive) heart failure: Secondary | ICD-10-CM

## 2017-07-09 DIAGNOSIS — N183 Chronic kidney disease, stage 3 (moderate): Secondary | ICD-10-CM

## 2017-07-09 DIAGNOSIS — I428 Other cardiomyopathies: Secondary | ICD-10-CM

## 2017-07-09 DIAGNOSIS — Z79899 Other long term (current) drug therapy: Secondary | ICD-10-CM

## 2017-07-09 DIAGNOSIS — I447 Left bundle-branch block, unspecified: Secondary | ICD-10-CM

## 2017-07-09 DIAGNOSIS — Z96642 Presence of left artificial hip joint: Secondary | ICD-10-CM

## 2017-07-09 DIAGNOSIS — E785 Hyperlipidemia, unspecified: Secondary | ICD-10-CM

## 2017-07-09 DIAGNOSIS — Z7982 Long term (current) use of aspirin: Secondary | ICD-10-CM

## 2017-07-09 DIAGNOSIS — R748 Abnormal levels of other serum enzymes: Secondary | ICD-10-CM

## 2017-07-09 LAB — BASIC METABOLIC PANEL
ANION GAP: 10 (ref 5–15)
BUN: 19 mg/dL (ref 6–20)
CALCIUM: 9.1 mg/dL (ref 8.9–10.3)
CO2: 28 mmol/L (ref 22–32)
CREATININE: 1.32 mg/dL — AB (ref 0.61–1.24)
Chloride: 100 mmol/L — ABNORMAL LOW (ref 101–111)
GFR calc Af Amer: 57 mL/min — ABNORMAL LOW (ref >60)
GFR, EST NON AFRICAN AMERICAN: 49 mL/min — AB (ref >60)
GLUCOSE: 148 mg/dL — AB (ref 65–99)
Potassium: 3.6 mmol/L (ref 3.5–5.1)
SODIUM: 138 mmol/L (ref 135–145)

## 2017-07-09 LAB — T4, FREE: Free T4: 0.85 ng/dL (ref 0.61–1.12)

## 2017-07-09 LAB — GLUCOSE, CAPILLARY
GLUCOSE-CAPILLARY: 121 mg/dL — AB (ref 65–99)
GLUCOSE-CAPILLARY: 119 mg/dL — AB (ref 65–99)
GLUCOSE-CAPILLARY: 134 mg/dL — AB (ref 65–99)
Glucose-Capillary: 95 mg/dL (ref 65–99)

## 2017-07-09 LAB — MAGNESIUM: Magnesium: 1.6 mg/dL — ABNORMAL LOW (ref 1.7–2.4)

## 2017-07-09 MED ORDER — MAGNESIUM SULFATE 2 GM/50ML IV SOLN
2.0000 g | Freq: Once | INTRAVENOUS | Status: AC
  Administered 2017-07-09: 2 g via INTRAVENOUS
  Filled 2017-07-09 (×2): qty 50

## 2017-07-09 MED ORDER — MAGNESIUM OXIDE 400 (241.3 MG) MG PO TABS
200.0000 mg | ORAL_TABLET | Freq: Every day | ORAL | Status: DC
  Administered 2017-07-10: 200 mg via ORAL
  Filled 2017-07-09: qty 1

## 2017-07-09 MED ORDER — HYDROCODONE-ACETAMINOPHEN 5-325 MG PO TABS
1.0000 | ORAL_TABLET | ORAL | Status: DC | PRN
  Administered 2017-07-09 (×2): 1 via ORAL
  Filled 2017-07-09 (×2): qty 1

## 2017-07-09 MED ORDER — DOCUSATE SODIUM 100 MG PO CAPS: 100.0000 mg | ORAL_CAPSULE | Freq: Every day | ORAL | Status: DC | PRN

## 2017-07-09 NOTE — Evaluation (Signed)
Physical Therapy Evaluation Patient Details Name: Ryan Macdonald MRN: [id number] DOB: 1934/01/14 Today's Date: 07/09/2017   History of Present Illness  Pt is an 83 y/o male admitted secondary to SOB due to an acute CHF exacerbation. PMH including but not limited to CKD, NICM, ICD placement, PAF and HTN.  Clinical Impression  Pt presented supine in bed with HOB elevated, awake and willing to participate in therapy session. Prior to admission, pt reported that he was independent with functional mobility and ADLs. He stated that he uses a SPC PRN. Pt ambulated within his room with supervision, moderately antalgic gait pattern secondary to chronic L knee pain. Pt with no SOB or complaints throughout session. PT will continue to f/u with pt acutely to ensure a safe d/c home.    Follow Up Recommendations No PT follow up    Equipment Recommendations  None recommended by PT    Recommendations for Other Services       Precautions / Restrictions Restrictions Weight Bearing Restrictions: No      Mobility  Bed Mobility Overal bed mobility: Modified Independent                Transfers Overall transfer level: Modified independent Equipment used: None                Ambulation/Gait Ambulation/Gait assistance: Supervision Ambulation Distance (Feet): 25 Feet Assistive device: None Gait Pattern/deviations: Step-to pattern;Step-through pattern;Decreased step length - right;Decreased step length - left;Decreased stride length;Antalgic Gait velocity: decreased   General Gait Details: did not use an AD, however, reaching for support surfaces frequently. No instability, just slightly antalgic secondary to L knee and hip pain  Stairs            Wheelchair Mobility    Modified Rankin (Stroke Patients Only)       Balance Overall balance assessment: Needs assistance Sitting-balance support: Feet supported Sitting balance-Leahy Scale: Good     Standing balance support:  During functional activity;No upper extremity supported Standing balance-Leahy Scale: Fair                               Pertinent Vitals/Pain Pain Assessment: No/denies pain    Home Living Family/patient expects to be discharged to:: Private residence Living Arrangements: Spouse/significant other Available Help at Discharge: Family;Available 24 hours/day Type of Home: Apartment Home Access: Stairs to enter Entrance Stairs-Rails: Right Entrance Stairs-Number of Steps: 4 + 4 Home Layout: One level Home Equipment: Walker - 2 wheels;Cane - single point      Prior Function Level of Independence: Independent with assistive device(s)         Comments: Uses SPC PRN. Drives, cooks, cleans. No falls reported.      Hand Dominance        Extremity/Trunk Assessment   Upper Extremity Assessment Upper Extremity Assessment: Overall WFL for tasks assessed    Lower Extremity Assessment Lower Extremity Assessment: LLE deficits/detail LLE Deficits / Details: pt with reports of chronic, intermittent L knee and hip pain       Communication   Communication: No difficulties  Cognition Arousal/Alertness: Awake/alert Behavior During Therapy: WFL for tasks assessed/performed Overall Cognitive Status: Within Functional Limits for tasks assessed                                        General Comments  Exercises     Assessment/Plan    PT Assessment Patient needs continued PT services  PT Problem List Decreased strength;Decreased balance;Decreased mobility;Decreased coordination;Decreased knowledge of use of DME;Decreased safety awareness;Pain       PT Treatment Interventions DME instruction;Gait training;Stair training;Therapeutic activities;Functional mobility training;Therapeutic exercise;Balance training;Neuromuscular re-education;Patient/family education    PT Goals (Current goals can be found in the Care Plan section)  Acute Rehab PT  Goals Patient Stated Goal: to get out of the hospital and do something he enjoys such as fishing PT Goal Formulation: With patient Time For Goal Achievement: 07/23/17 Potential to Achieve Goals: Good    Frequency Min 3X/week   Barriers to discharge        Co-evaluation               AM-PAC PT "6 Clicks" Daily Activity  Outcome Measure Difficulty turning over in bed (including adjusting bedclothes, sheets and blankets)?: None Difficulty moving from lying on back to sitting on the side of the bed? : None Difficulty sitting down on and standing up from a chair with arms (e.g., wheelchair, bedside commode, etc,.)?: None Help needed moving to and from a bed to chair (including a wheelchair)?: A Little Help needed walking in hospital room?: A Little Help needed climbing 3-5 steps with a railing? : A Little 6 Click Score: 21    End of Session   Activity Tolerance: Patient tolerated treatment well Patient left: in chair;with call bell/phone within reach;Other (comment) (RN in room) Nurse Communication: Mobility status PT Visit Diagnosis: Other abnormalities of gait and mobility (R26.89)    Time: 7672-0947 PT Time Calculation (min) (ACUTE ONLY): 51 min   Charges:   PT Evaluation $PT Eval Moderate Complexity: 1 Mod PT Treatments $Gait Training: 8-22 mins $Therapeutic Activity: 8-22 mins   PT G Codes:        Elsie, PT, DPT [phone]   Alessandra Bevels Savier Trickett 07/09/2017, 1:02 PM

## 2017-07-09 NOTE — Progress Notes (Addendum)
Subjective:  No acute events overnight. Complaining of some L knee pain. Restarted home Norco/Vicodin. Net negative 2.8L since yesterday. Weight 257 yesterday -> 254 lbs today. On IV lasix 80mg  BID. 8 beats of vtach overnight. Has an ICD.  Objective:  Vital signs in last 24 hours: Vitals:   07/08/17 1949 07/09/17 0031 07/09/17 0411 07/09/17 0754  BP: (!) 117/57 (!) 108/55 112/60 109/65  Pulse: 84 77 89 78  Resp: 20 20 20 18   Temp: 98.5 F (36.9 C) 98.3 F (36.8 C) 99 F (37.2 C) (!) 97.5 F (36.4 C)  TempSrc: Oral Oral Oral Oral  SpO2: 97% 97% 97% 100%  Weight:   254 lb 4.8 oz (115.3 kg)   Height:       GEN: Well-appearing, lying in bed comfortably in NAD.  RESP: Clear to auscultation bilaterally. No wheezes, rales, or rhonchi. No increased work of breathing or respiratory distress on RA. CV: Distant heart sounds. Normal rate and regular rhythm. No murmurs, gallops, or rubs. No JVD elevation appreciated. 1+ LE edema. ABD: Soft. Non-tender. Non-distended. Normoactive bowel sounds. EXT: 1+ LE edema to the knees. Warm and well perfused. NEURO: Cranial nerves II-XII grossly intact. Able to lift all four extremities against gravity. No apparent audiovisual hallucinations. Speech fluent and appropriate. PSYCH: Patient is calm and pleasant. Appropriate affect. Well-groomed; speech is appropriate and on-subject.  Assessment/Plan:  Principal Problem:   Acute on chronic systolic CHF (congestive heart failure) (HCC) Active Problems:   Nonischemic cardiomyopathy (HCC)   Obesity (BMI 30-39.9)   Essential hypertension   Hyperlipidemia   Atrial fibrillation (HCC)   CKD (chronic kidney disease), stage III   ICD (implantable cardioverter-defibrillator) in place   Diabetes mellitus type 2 in obese [facility])   Left knee pain  This is an 83 year old gentleman with PMH of NICM, HFrEF (EF 15-20% by TTE 01/2017), ICD placement, Paroxysmal Atrial Fibrillation on Eliquis, HTN, CKD Stage 2/3,  HLD, Osteoarthritis, Gout, and Obesity who presents to the ED with acute on chronic HFrEF exacerbation, diuresing well on 80mg  IV lasix BID.  Acute on Chronic Systolic CHF Exacerbation NICM (EF 15-20%) s/p ICD Placement: Saturating well on 1 L O2 via [place]. No crackles on lung exam. Turned O2 down to RA and he was still able to speak to [place] in full sentences. Weight down 3lbs from yesterday. Net negative 2.8L on IV Lasix 80mg  BID. Diuresing well. - D/c telemetry - Continue IV Lasix 80mg  BID - Potassium 40 mEq daily - Mg sulfate - Strict I/Os, Daily standing weights - Continue home Carvedilol 6.25 mg BID, Spironolactone 25 mg qd - No ACE-I due to history of facial swelling - ASA 81 mg daily - up and out of bed as able - PT eval - Will likely discharge home on PO Lasix 60mg  BID. (Patient was taking 60 qAM, 40 qPM)  OA s/p Left Total Hip Arthroplasty with Left Knee Pain: Exam and history does not seem consistent with previous gouty arthritis flare ups. This may be secondary from gait change associated with his chronic left hip pain. Continues to have pain despite Tylenol & Voltaren gel. - Tylenol prn - Voltaren gel - Restarted home Norco/Vicodin - Continue to monitor  Paroxysmal Atrial Fibrillation: CHA2DS2-VASc score is at least 5 with a yearly stroke risk of 6.7%. - Continue Eliquis 5 mg BID  T2DM: Last A1c on file 6.3% on 02/03/17 - SSI-S  HTN: BP stable. - Continue home Carvedilol 6.25 mg BID, Hydralazine 25 mg  TID, Spironolactone 25 mg daily, Lasix as above  HLD: - Continue home Atorvastatin 40 mg daily  Gout: Left knee exam not consistent with acute gout flare.  - Continue home Allopurinol 200 mg daily  Subclinical hypothyroidism: TSH 6.9 (slightly elevated). Free T4 normal. - continue to monitor  Code Status: FULL CODE confirmed with patient  Dispo: Anticipated discharge in approximately 1 day(s).   Scherrie Gerlach, MD  Internal Medicine, PGY-1 07/09/2017,  7:57 AM P [phone]

## 2017-07-09 NOTE — Discharge Summary (Signed)
Name: Ryan Macdonald MRN: [id number] DOB: 1934/02/29 83 y.o. PCP: [facility]  Date of Admission: 07/08/2017  6:16 AM Date of Discharge: 07/10/2017 Attending Physician: Levert Feinstein, MD  Discharge Diagnosis: 1. Acute exacerbation on chronic systolic CHF  Principal Problem:   Acute on chronic systolic CHF (congestive heart failure) (HCC) Active Problems:   Nonischemic cardiomyopathy (HCC)   Obesity (BMI 30-39.9)   Essential hypertension   Hyperlipidemia   Atrial fibrillation (HCC)   Chronic renal insufficiency, stage III (moderate)   ICD (implantable cardioverter-defibrillator) in place   Diabetes mellitus type 2 in obese (HCC)   Left knee pain   Renal insufficiency   Discharge Medications: Allergies as of 07/10/2017      Reactions   Lisinopril Swelling   Facial swelling      Medication List    TAKE these medications   allopurinol 100 MG tablet Commonly known as:  ZYLOPRIM Take 200 mg by mouth daily.   apixaban 5 MG Tabs tablet Commonly known as:  ELIQUIS Take 1 tablet (5 mg total) by mouth 2 (two) times daily.   carvedilol 12.5 MG tablet Commonly known as:  COREG Take 0.5 tablets (6.25 mg total) by mouth 2 (two) times daily with a meal.   cyanocobalamin 1000 MCG tablet Take 1,000 mcg by mouth daily.   diclofenac sodium 1 % Gel Commonly known as:  VOLTAREN Apply 4 g topically 4 (four) times daily.   diphenhydrAMINE 25 mg capsule Commonly known as:  BENADRYL Take 25 mg by mouth at bedtime as needed for sleep.   finasteride 5 MG tablet Commonly known as:  PROSCAR Take 5 mg by mouth daily.   furosemide 20 MG tablet Commonly known as:  LASIX Take 3 tablets (60 mg total) by mouth 2 (two) times daily.   glipiZIDE 5 MG tablet Commonly known as:  GLUCOTROL Take 5 mg by mouth daily before breakfast.   hydrALAZINE 25 MG tablet Commonly known as:  APRESOLINE Take 25 mg by mouth 3 (three) times daily.   HYDROcodone-acetaminophen  5-325 MG tablet Commonly known as:  NORCO/VICODIN Take 2 tablets by mouth every 4 (four) hours as needed. What changed:  how much to take  when to take this   ipratropium-albuterol 0.5-2.5 (3) MG/3ML Soln Commonly known as:  DUONEB Take 3 mLs by nebulization every 4 (four) hours as needed.   isosorbide dinitrate 40 MG CR capsule Commonly known as:  DILATRATE-SR Take 40 mg by mouth 3 (three) times daily.   ketoconazole 2 % shampoo Commonly known as:  NIZORAL Apply 1 application topically 2 (two) times a week.   MAGNESIUM PO Take 0.5 tablets by mouth 2 (two) times daily.   potassium chloride 10 MEQ tablet Commonly known as:  K-DUR Take 2 tablets (20 mEq total) by mouth daily.   sertraline 25 MG tablet Commonly known as:  ZOLOFT Take 50 mg by mouth daily as needed (for anxiety). Takes 1-2 times per week   simvastatin 80 MG tablet Commonly known as:  ZOCOR Take 40 mg by mouth daily.   spironolactone 25 MG tablet Commonly known as:  ALDACTONE Take 25 mg by mouth daily.   tamsulosin 0.4 MG Caps capsule Commonly known as:  FLOMAX Take 0.4 mg by mouth at bedtime.       Disposition and follow-up:   Ryan Macdonald was discharged from [facility] in Good condition.  At the hospital follow up visit please address:  1. HFrEF exacerbation: Increased his  lasix to 60 mg bid. Is he adhering to Lasix 60mg  BID? Have his weights been stable? How is his breathing? Please adjust Lasix dose as needed.   2.  Labs / imaging needed at time of follow-up: BMET  3.  Pending labs/ test needing follow-up: None  Follow-up Appointments: Follow-up Information    [facility]. Schedule an appointment as soon as possible for a visit in 1 week(s).   Specialty:  General Practice Contact information: [address] [place] [address] [phone]           Hospital Course by problem list:   1. Acute exacerbation on chronic systolic CHF Patient presented  on 8/10 with acute episode of dyspnea. Has been seen frequently in ED for acute exacerbations. Most recently was discharged on Lasix 60mg  BID. He was recently told to decrease his evening Lasix dose to 40mg . Upon arrival to the hospital, O2 sats dropped as low as 81% on RA. He was placed on CPAP, then BiPAP with improvement in O2 saturations to >95%. BNP 416, recent baseline was 302 on July 24. Troponin 0.06 x3 with CKD stage 3 (baseline Cr 1.4). ABG with 7.427/40.8/73. EKG with sinus rhythm. CXR with cardiomegaly and mild vascular congestion. He was started on IV Lasix 80mg  BID and diuresed well. Net negative 3.2L  throughout his admission and weight 257 lbs > 252 lbs. Discharged on Lasix 60 mg bid; to follow up with PCP in 1 week. Instructed to continue checking daily weights.   2. Subclinical hypothyroidism TSH 6.935 (minimally elevated), free T4 0.85 (normal) in the hospital. No treatment, continue to monitor.  3. Paroxysmal Atrial Fibrillation, on Eliquis In sinus rhythm in the hospital. Reports adherence to Eliquis. CHA2DS2-VASc score at least 5 with yearly stroke risk of 6.7% Discharged on home Eliquis 5mg  BID.  4. OA s/p left total hip arthroplasty with left knee pain Left knee pain that was not consistent with previous gouty arthritis flare-ups. Provided tylenol and Voltaren gel as needed, as well as home Norco/Vicodin.  Discharge Vitals:   BP 105/67 (BP Location: Left Arm)   Pulse 79   Temp (!) 97.5 F (36.4 C) (Oral)   Resp 17   Ht 5\' 7"  (1.702 m)   Wt 252 lb 8 oz (114.5 kg) Comment: b scale  SpO2 98%   BMI 39.55 kg/m   Pertinent Labs, Studies, and Procedures:  CBC    Component Value Date/Time   WBC 8.1 07/08/2017 0640   RBC 4.04 (L) 07/08/2017 0640   HGB 12.5 (L) 07/08/2017 0640   HCT 37.6 (L) 07/08/2017 0640   PLT 254 07/08/2017 0640   MCV 93.1 07/08/2017 0640   MCH 30.9 07/08/2017 0640   MCHC 33.2 07/08/2017 0640   RDW 14.6 07/08/2017 0640   LYMPHSABS 2.2 07/08/2017  0640   MONOABS 0.5 07/08/2017 0640   EOSABS 0.3 07/08/2017 0640   BASOSABS 0.0 07/08/2017 0640   CMP Latest Ref Rng & Units 07/10/2017 07/09/2017 07/08/2017  Glucose 65 - 99 mg/dL 914(N) 829(F) 621(H)  BUN 6 - 20 mg/dL 08(M) 19 20  Creatinine 0.61 - 1.24 mg/dL 5.78(I) 6.96(E) 9.52(W)  Sodium 135 - 145 mmol/L 139 138 140  Potassium 3.5 - 5.1 mmol/L 3.6 3.6 3.6  Chloride 101 - 111 mmol/L 99(L) 100(L) 101  CO2 22 - 32 mmol/L 31 28 27   Calcium 8.9 - 10.3 mg/dL 9.3 9.1 9.2  Total Protein 6.5 - 8.1 g/dL - - 7.5  Total Bilirubin 0.3 - 1.2  mg/dL - - 0.6  Alkaline Phos 38 - 126 U/L - - 59  AST 15 - 41 U/L - - 36  ALT 17 - 63 U/L - - 24   ABG 7.427/40.8/73 BNP 416 Troponin 0.05 -> 0.06 -> 0.06 -> 0.06 TSH 6.935, free T4 0.85 Mg 1.6  CXR 8/10 Mild vascular congestion and mild cardiomegaly. Increased interstitial markings raise concern for mild interstitial edema.  Signed: Valentino Nose, MD 07/10/2017, 9:54 AM   Pager: Demetrius Charity [phone]

## 2017-07-09 NOTE — Progress Notes (Signed)
Medicine attending: I examined this patient today together with resident physician Dr. Jennifer Huang and I concur with her evaluation and management plan which we discussed together.  Please see separate attending admission note for complete details. 

## 2017-07-09 NOTE — Discharge Instructions (Addendum)
Ryan Macdonald, Please go up on your lasix dose to 60 mg (3 pills) twice a day. Continue to weigh yourself daily. If you see an increase in your weight increases by 2-3 pounds in a day or 5 pounds in a week or you experience worsened shortness of breath, please take an additional dose of lasix and contact your primary care provider. I would like for you to follow up with your primary care doctor in the next week.    Heart Failure Heart failure means your heart has trouble pumping blood. This makes it hard for your body to work well. Heart failure is usually a long-term (chronic) condition. You must take good care of yourself and follow your doctor's treatment plan. Follow these instructions at home:  Take your heart medicine as told by your doctor. ? Do not stop taking medicine unless your doctor tells you to. ? Do not skip any dose of medicine. ? Refill your medicines before they run out. ? Take other medicines only as told by your doctor or pharmacist.  Stay active if told by your doctor. The elderly and people with severe heart failure should talk with a doctor about physical activity.  Eat heart-healthy foods. Choose foods that are without trans fat and are low in saturated fat, cholesterol, and salt (sodium). This includes fresh or frozen fruits and vegetables, fish, lean meats, fat-free or low-fat dairy foods, whole grains, and high-fiber foods. Lentils and dried peas and beans (legumes) are also good choices.  Limit salt if told by your doctor.  Cook in a healthy way. Roast, grill, broil, bake, poach, steam, or stir-fry foods.  Limit fluids as told by your doctor.  Weigh yourself every morning. Do this after you pee (urinate) and before you eat breakfast. Write down your weight to give to your doctor.  Take your blood pressure and write it down if your doctor tells you to.  Ask your doctor how to check your pulse. Check your pulse as told.  Lose weight if told by your  doctor.  Stop smoking or chewing tobacco. Do not use gum or patches that help you quit without your doctor's approval.  Schedule and go to doctor visits as told.  Nonpregnant women should have no more than 1 drink a day. Men should have no more than 2 drinks a day. Talk to your doctor about drinking alcohol.  Stop illegal drug use.  Stay current with shots (immunizations).  Manage your health conditions as told by your doctor.  Learn to manage your stress.  Rest when you are tired.  If it is really hot outside: ? Avoid intense activities. ? Use air conditioning or fans, or get in a cooler place. ? Avoid caffeine and alcohol. ? Wear loose-fitting, lightweight, and light-colored clothing.  If it is really cold outside: ? Avoid intense activities. ? Layer your clothing. ? Wear mittens or gloves, a hat, and a scarf when going outside. ? Avoid alcohol.  Learn about heart failure and get support as needed.  Get help to maintain or improve your quality of life and your ability to care for yourself as needed. Contact a doctor if:  You gain weight quickly.  You are more short of breath than usual.  You cannot do your normal activities.  You tire easily.  You cough more than normal, especially with activity.  You have any or more puffiness (swelling) in areas such as your hands, feet, ankles, or belly (abdomen).  You cannot sleep because  it is hard to breathe.  You feel like your heart is beating fast (palpitations).  You get dizzy or light-headed when you stand up. Get help right away if:  You have trouble breathing.  There is a change in mental status, such as becoming less alert or not being able to focus.  You have chest pain or discomfort.  You faint. This information is not intended to replace advice given to you by your health care provider. Make sure you discuss any questions you have with your health care provider. Document Released: 08/24/2008 Document  Revised: 04/22/2016 Document Reviewed: 01/01/2013 Elsevier Interactive Patient Education  2017 [employer].    Information on my medicine - ELIQUIS (apixaban)  Why was Eliquis prescribed for you? Eliquis was prescribed for you to reduce the risk of a blood clot forming that can cause a stroke if you have a medical condition called atrial fibrillation (a type of irregular heartbeat).  What do You need to know about Eliquis ? Take your Eliquis TWICE DAILY - one tablet in the morning and one tablet in the evening with or without food. If you have difficulty swallowing the tablet whole please discuss with your pharmacist how to take the medication safely.  Take Eliquis exactly as prescribed by your doctor and DO NOT stop taking Eliquis without talking to the doctor who prescribed the medication.  Stopping may increase your risk of developing a stroke.  Refill your prescription before you run out.  After discharge, you should have regular check-up appointments with your healthcare provider that is prescribing your Eliquis.  In the future your dose may need to be changed if your kidney function or weight changes by a significant amount or as you get older.  What do you do if you miss a dose? If you miss a dose, take it as soon as you remember on the same day and resume taking twice daily.  Do not take more than one dose of ELIQUIS at the same time to make up a missed dose.  Important Safety Information A possible side effect of Eliquis is bleeding. You should call your healthcare provider right away if you experience any of the following: ? Bleeding from an injury or your nose that does not stop. ? Unusual colored urine (red or dark brown) or unusual colored stools (red or black). ? Unusual bruising for unknown reasons. ? A serious fall or if you hit your head (even if there is no bleeding).  Some medicines may interact with Eliquis and might increase your risk of bleeding or  clotting while on Eliquis. To help avoid this, consult your healthcare provider or pharmacist prior to using any new prescription or non-prescription medications, including herbals, vitamins, non-steroidal anti-inflammatory drugs (NSAIDs) and supplements.  This website has more information on Eliquis (apixaban): http://www.eliquis.com/eliquis/home

## 2017-07-09 NOTE — Progress Notes (Signed)
Patient alarmed 9 beats v-tach.  Patient asymptomatic.  MD rounding on patient at time and made aware.  Will continue to monitor.

## 2017-07-09 NOTE — Progress Notes (Signed)
Pt c/o Left knee pain.  Voltaren applied and tylenol given, but did not fully relieve pain.  Pt requested addition medication.  Paged MD.  Call returned and awaiting new orders.

## 2017-07-10 DIAGNOSIS — E038 Other specified hypothyroidism: Secondary | ICD-10-CM

## 2017-07-10 LAB — BASIC METABOLIC PANEL
ANION GAP: 9 (ref 5–15)
BUN: 23 mg/dL — ABNORMAL HIGH (ref 6–20)
CHLORIDE: 99 mmol/L — AB (ref 101–111)
CO2: 31 mmol/L (ref 22–32)
Calcium: 9.3 mg/dL (ref 8.9–10.3)
Creatinine, Ser: 1.43 mg/dL — ABNORMAL HIGH (ref 0.61–1.24)
GFR calc non Af Amer: 44 mL/min — ABNORMAL LOW (ref >60)
GFR, EST AFRICAN AMERICAN: 51 mL/min — AB (ref >60)
Glucose, Bld: 135 mg/dL — ABNORMAL HIGH (ref 65–99)
POTASSIUM: 3.6 mmol/L (ref 3.5–5.1)
SODIUM: 139 mmol/L (ref 135–145)

## 2017-07-10 LAB — GLUCOSE, CAPILLARY
GLUCOSE-CAPILLARY: 113 mg/dL — AB (ref 65–99)
GLUCOSE-CAPILLARY: 113 mg/dL — AB (ref 65–99)

## 2017-07-10 MED ORDER — POTASSIUM CHLORIDE ER 10 MEQ PO TBCR: 20.0000 meq | EXTENDED_RELEASE_TABLET | Freq: Every day | ORAL | 0 refills | Status: DC

## 2017-07-10 MED ORDER — FUROSEMIDE 20 MG PO TABS: 60.0000 mg | ORAL_TABLET | Freq: Two times a day (BID) | ORAL | 0 refills | Status: DC

## 2017-07-10 MED ORDER — FUROSEMIDE 40 MG PO TABS
60.0000 mg | ORAL_TABLET | Freq: Two times a day (BID) | ORAL | Status: DC
Start: 2017-07-10 — End: 2017-07-10
  Administered 2017-07-10: 10:00:00 60 mg via ORAL
  Filled 2017-07-10: qty 1

## 2017-07-10 MED ORDER — DICLOFENAC SODIUM 1 % TD GEL: 4.0000 g | Freq: Four times a day (QID) | TRANSDERMAL | 0 refills | Status: DC

## 2017-07-10 NOTE — Progress Notes (Signed)
Pt has orders to be discharged. Discharge instructions given and pt has no additional questions at this time. Medication regimen reviewed and pt educated. Pt verbalized understanding and has no additional questions. IV removed and site in good condition. Pt stable and waiting for transportation. 

## 2017-07-10 NOTE — Progress Notes (Signed)
   Subjective:  [name] is doing well this morning. He denies any shortness of breath or chest pain. He believes he is back at his baseline. He was able to work with PT yesterday with no SOB during session. Continues to diurese well. Reports he follows at the [place] and can arrange a follow up appointment in the next 1-2 weeks.   Objective:  Vital signs in last 24 hours: Vitals:   07/09/17 1700 07/09/17 2101 07/10/17 0049 07/10/17 0529  BP: 112/62 114/69 115/62 105/67  Pulse: 72 88 73 79  Resp: 17 18  17   Temp: 98.6 F (37 C) 97.8 F (36.6 C)  (!) 97.5 F (36.4 C)  TempSrc: Oral Oral  Oral  SpO2: 100% 98% 95% 98%  Weight:    252 lb 8 oz (114.5 kg)  Height:       GENERAL- alert, co-operative, appears as stated age, not in any distress. CARDIAC- RRR, no murmurs, rubs or gallops. No JVD. Trace peripheral edema.  RESP- CTAB, no rales or crackles present  ABDOMEN- Soft, nontender, bowel sounds present.  Assessment/Plan:  Acute on Chronic Systolic CHF Exacerbation NICM (EF 15-20%) s/p ICD Placement: Only net -427 mL yesterday. Net -3.2 L since admission. Weight 257 lbs on admission to 252 lbs today. Has been 96+% on room air though appears to keep being put back on O2 (no flow documented) despite not being hypoxic. Cr bumped from 1.32 to 1.43 today. Likely at his dry weight. He reports taking Lasix 60 mg qam and 40 mg qhs at home. This was recently changed from 60 bid. Suspect this may have contributed to his exacerbation. Will resume his previous 60 mg bid at discharge.  - Potassium 40 mEq daily - Continue home Carvedilol 6.25 mg BID, Spironolactone 25 mg qd - No ACE-I due to history of facial swelling - ASA 81 mg daily - Will discharge home on PO Lasix 60mg  BID. (Patient was taking 60 qAM, 40 qPM)  OA s/p Left Total Hip Arthroplasty with Left Knee Pain: Continue home Norco as well as tylenol prn and voltaren gel.   Paroxysmal Atrial Fibrillation: CHA2DS2-VASc score is at  least 5 with a yearly stroke risk of 6.7%. - Continue Eliquis 5 mg BID  T2DM: Last A1c on file 6.3% on 02/03/17. CBGs stable 119-134. - SSI-S  HTN: BP stable. - Continue home Carvedilol 6.25 mg BID, Hydralazine 25 mg TID, Spironolactone 25 mg daily, Lasix as above  Dispo: Anticipated discharge today  Ryan Nose, MD 07/10/2017, 7:02 AM Pager: [phone]

## 2017-07-28 ENCOUNTER — Emergency Department (HOSPITAL_COMMUNITY): Payer: Non-veteran care

## 2017-07-28 ENCOUNTER — Emergency Department (HOSPITAL_COMMUNITY)
Admission: EM | Admit: 2017-07-28 | Discharge: 2017-07-28 | Disposition: A | Discharge status: Home / Self Care | Payer: Non-veteran care | Attending: Emergency Medicine | Admitting: Emergency Medicine

## 2017-07-28 ENCOUNTER — Encounter (HOSPITAL_COMMUNITY): Payer: Self-pay

## 2017-07-28 DIAGNOSIS — Z96642 Presence of left artificial hip joint: Secondary | ICD-10-CM | POA: Diagnosis not present

## 2017-07-28 DIAGNOSIS — R5383 Other fatigue: Secondary | ICD-10-CM

## 2017-07-28 DIAGNOSIS — Z87891 Personal history of nicotine dependence: Secondary | ICD-10-CM | POA: Insufficient documentation

## 2017-07-28 DIAGNOSIS — Z7901 Long term (current) use of anticoagulants: Secondary | ICD-10-CM | POA: Insufficient documentation

## 2017-07-28 DIAGNOSIS — I5023 Acute on chronic systolic (congestive) heart failure: Secondary | ICD-10-CM | POA: Insufficient documentation

## 2017-07-28 DIAGNOSIS — M109 Gout, unspecified: Secondary | ICD-10-CM | POA: Insufficient documentation

## 2017-07-28 DIAGNOSIS — N390 Urinary tract infection, site not specified: Secondary | ICD-10-CM | POA: Insufficient documentation

## 2017-07-28 DIAGNOSIS — E119 Type 2 diabetes mellitus without complications: Secondary | ICD-10-CM | POA: Diagnosis not present

## 2017-07-28 DIAGNOSIS — Z79899 Other long term (current) drug therapy: Secondary | ICD-10-CM | POA: Insufficient documentation

## 2017-07-28 DIAGNOSIS — Z7984 Long term (current) use of oral hypoglycemic drugs: Secondary | ICD-10-CM | POA: Diagnosis not present

## 2017-07-28 DIAGNOSIS — I110 Hypertensive heart disease with heart failure: Secondary | ICD-10-CM | POA: Diagnosis not present

## 2017-07-28 LAB — COMPREHENSIVE METABOLIC PANEL
ALBUMIN: 3.5 g/dL (ref 3.5–5.0)
ALT: 18 U/L (ref 17–63)
AST: 27 U/L (ref 15–41)
Alkaline Phosphatase: 58 U/L (ref 38–126)
Anion gap: 11 (ref 5–15)
BILIRUBIN TOTAL: 0.8 mg/dL (ref 0.3–1.2)
BUN: 17 mg/dL (ref 6–20)
CO2: 30 mmol/L (ref 22–32)
Calcium: 9.5 mg/dL (ref 8.9–10.3)
Chloride: 101 mmol/L (ref 101–111)
Creatinine, Ser: 1.34 mg/dL — ABNORMAL HIGH (ref 0.61–1.24)
GFR calc Af Amer: 56 mL/min — ABNORMAL LOW (ref >60)
GFR calc non Af Amer: 48 mL/min — ABNORMAL LOW (ref >60)
GLUCOSE: 126 mg/dL — AB (ref 65–99)
POTASSIUM: 4.2 mmol/L (ref 3.5–5.1)
SODIUM: 142 mmol/L (ref 135–145)
TOTAL PROTEIN: 7.6 g/dL (ref 6.5–8.1)

## 2017-07-28 LAB — CBC WITH DIFFERENTIAL/PLATELET
BASOS ABS: 0 10*3/uL (ref 0.0–0.1)
BASOS PCT: 0 %
EOS ABS: 0.2 10*3/uL (ref 0.0–0.7)
Eosinophils Relative: 2 %
HEMATOCRIT: 38.6 % — AB (ref 39.0–52.0)
HEMOGLOBIN: 12.6 g/dL — AB (ref 13.0–17.0)
Lymphocytes Relative: 26 %
Lymphs Abs: 2.4 10*3/uL (ref 0.7–4.0)
MCH: 30.4 pg (ref 26.0–34.0)
MCHC: 32.6 g/dL (ref 30.0–36.0)
MCV: 93 fL (ref 78.0–100.0)
MONO ABS: 0.8 10*3/uL (ref 0.1–1.0)
Monocytes Relative: 9 %
NEUTROS ABS: 5.9 10*3/uL (ref 1.7–7.7)
NEUTROS PCT: 63 %
Platelets: 260 10*3/uL (ref 150–400)
RBC: 4.15 MIL/uL — ABNORMAL LOW (ref 4.22–5.81)
RDW: 14.8 % (ref 11.5–15.5)
WBC: 9.3 10*3/uL (ref 4.0–10.5)

## 2017-07-28 LAB — URINALYSIS, ROUTINE W REFLEX MICROSCOPIC
BILIRUBIN URINE: NEGATIVE
GLUCOSE, UA: NEGATIVE mg/dL
KETONES UR: NEGATIVE mg/dL
NITRITE: POSITIVE — AB
PH: 5 (ref 5.0–8.0)
PROTEIN: 30 mg/dL — AB
SPECIFIC GRAVITY, URINE: 1.012 (ref 1.005–1.030)

## 2017-07-28 LAB — I-STAT TROPONIN, ED: Troponin i, poc: 0.03 ng/mL (ref 0.00–0.08)

## 2017-07-28 MED ORDER — ONDANSETRON 4 MG PO TBDP
4.0000 mg | ORAL_TABLET | Freq: Once | ORAL | Status: AC
  Administered 2017-07-28: 4 mg via ORAL
  Filled 2017-07-28: qty 1

## 2017-07-28 MED ORDER — CEPHALEXIN 500 MG PO CAPS: ORAL_CAPSULE | ORAL | 0 refills | Status: DC

## 2017-07-28 MED ORDER — CEFTRIAXONE SODIUM 1 G IJ SOLR
1.0000 g | Freq: Once | INTRAMUSCULAR | Status: AC
  Administered 2017-07-28: 1 g via INTRAMUSCULAR
  Filled 2017-07-28: qty 10

## 2017-07-28 MED ORDER — ONDANSETRON HCL 4 MG/2ML IJ SOLN: 4.0000 mg | Freq: Once | INTRAMUSCULAR | Status: DC

## 2017-07-28 MED ORDER — OXYCODONE-ACETAMINOPHEN 5-325 MG PO TABS
2.0000 | ORAL_TABLET | Freq: Once | ORAL | Status: AC
  Administered 2017-07-28: 2 via ORAL
  Filled 2017-07-28: qty 2

## 2017-07-28 NOTE — Discharge Instructions (Signed)
Please seek immediate care if you develop the following: ?There is back pain.  ?Your symptoms are no better or worse in 3 days. ?There is severe back pain or lower abdominal pain.  ?You develop chills.  ?You have a fever.  ?There is nausea or vomiting.  ?There is continued burning or discomfort with urination.  ? ?

## 2017-07-28 NOTE — ED Provider Notes (Signed)
[facility] DEPT Provider Note   CSN: [id number] Arrival date & time: 07/28/17  0105     History   Chief Complaint Chief Complaint  Patient presents with  . Fatigue    HPI Ryan Macdonald is a 83 y.o. malepresents emergency Department with chief complaint of fatigue. He has a past medical history of cardiac disease, CHF, gout, chronic A. Fib on anticoagulation. Patient states that over the past several days he has felt malaise and fatigue. He is unsure of the cause. He has noticed more frequent urination. He denies dysuria or hematuria. patient denies chest pain, shortness of breath, exertional dyspnea or pain. HPI  Past Medical History:  Diagnosis Date  . Cat scratch fever    removed mass on right side neck  . Cataracts, bilateral   . CHF (congestive heart failure) (HCC)   . Diabetes mellitus without complication (HCC)    TYPE 2  . High cholesterol   . Hypertension   . Kidney stones   . Morbid obesity (HCC)   . Nonischemic cardiomyopathy (HCC)    a. ? 2009 Cath in [place] - nl cors per pt;  b. 09/2013 Echo: EF 25-30%, sev diff HK.  . Osteoarthritis   . PAF (paroxysmal atrial fibrillation) (HCC)    a. post-op hip in 2014 - prev on xarelto.  . Renal insufficiency   . Shortness of breath dyspnea     Patient Active Problem List   Diagnosis Date Noted  . Renal insufficiency   . Acute on chronic systolic CHF (congestive heart failure) (HCC) 07/08/2017  . Left knee pain 07/08/2017  . Acute bronchitis 05/10/2017  . Acute congestive heart failure (HCC) 04/14/2017  . Elevated troponin 04/14/2017  . Lactic acidosis   . Acute on chronic systolic heart failure, NYHA class 2 (HCC) 02/03/2017  . Diabetes mellitus type 2 in obese (HCC) 02/03/2017  . CAP (community acquired pneumonia) 02/03/2017  . Lobar pneumonia (HCC)   . Diabetes mellitus with complication (HCC)   . Acute on chronic respiratory failure with hypoxia (HCC)   . Acute on chronic combined systolic (congestive) and  diastolic (congestive) heart failure (HCC) 05/19/2016  . Depression 05/19/2016  . Gout 05/19/2016  . 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 NSVT (nonsustained ventricular tachycardia) (HCC)   . Chronic renal insufficiency, stage III (moderate)   . Pulmonary embolism (HCC)   . Acute respiratory failure with hypoxia (HCC)   . Osteoarthritis of left hip 10/25/2013  . Atrial fibrillation (HCC) 10/25/2013  . S/P hip replacement 10/24/2013  . Nonischemic cardiomyopathy (HCC)   . Obesity (BMI 30-39.9)   . Essential hypertension   . Hyperlipidemia     Past Surgical History:  Procedure Laterality Date  . CARDIAC CATHETERIZATION  1980's  . CIRCUMCISION  1940's  . CYSTOSCOPY WITH URETHRAL DILATATION  10/23/2013  . CYSTOSCOPY WITH URETHRAL DILATATION N/A 10/23/2013   Procedure: CYSTOSCOPY WITH URETHRAL DILATATION;  Surgeon: Kathi Ludwig, MD;  Location: [facility] OR;  Service: Urology;  Laterality: N/A;  . INCISION AND DRAINAGE OF WOUND Right 1980's   "cat scratch" (10/23/2013)  . INGUINAL HERNIA REPAIR Right 1950's  . KIDNEY STONE SURGERY Right 1970's; 1983   "twice"  . TONSILLECTOMY AND ADENOIDECTOMY  1940's  . TOTAL HIP ARTHROPLASTY Left 10/23/2013  . TOTAL HIP ARTHROPLASTY Left 10/23/2013   Procedure: TOTAL HIP ARTHROPLASTY;  Surgeon: Valeria Batman, MD;  Location: [facility] OR;  Service: Orthopedics;  Laterality: Left;       Home Medications  Prior to Admission medications   Medication Sig Start Date End Date Taking? Authorizing Provider  allopurinol (ZYLOPRIM) 100 MG tablet Take 200 mg by mouth daily.    [provider]  apixaban (ELIQUIS) 5 MG TABS tablet Take 1 tablet (5 mg total) by mouth 2 (two) times daily. 02/06/17   Amin, Loura Halt, MD  carvedilol (COREG) 12.5 MG tablet Take 0.5 tablets (6.25 mg total) by mouth 2 (two) times daily with a meal. 04/15/17   Osvaldo Shipper, MD  cyanocobalamin 1000 MCG tablet Take 1,000 mcg by mouth  daily.     [provider]  diclofenac sodium (VOLTAREN) 1 % GEL Apply 4 g topically 4 (four) times daily. 07/10/17   Valentino Nose, MD  diphenhydrAMINE (BENADRYL) 25 mg capsule Take 25 mg by mouth at bedtime as needed for sleep.    [provider]  finasteride (PROSCAR) 5 MG tablet Take 5 mg by mouth daily.    [provider]  furosemide (LASIX) 20 MG tablet Take 3 tablets (60 mg total) by mouth 2 (two) times daily. 07/10/17   Valentino Nose, MD  glipiZIDE (GLUCOTROL) 5 MG tablet Take 5 mg by mouth daily before breakfast.    [provider]  hydrALAZINE (APRESOLINE) 25 MG tablet Take 25 mg by mouth 3 (three) times daily.    [provider]  HYDROcodone-acetaminophen (NORCO/VICODIN) 5-325 MG tablet Take 2 tablets by mouth every 4 (four) hours as needed. Patient taking differently: Take 1 tablet by mouth 2 (two) times daily.  04/09/16   Isa Rankin, MD  ipratropium-albuterol (DUONEB) 0.5-2.5 (3) MG/3ML SOLN Take 3 mLs by nebulization every 4 (four) hours as needed. Patient not taking: Reported on 07/08/2017 05/14/17   Pearson Grippe, MD  isosorbide dinitrate (DILATRATE-SR) 40 MG CR capsule Take 40 mg by mouth 3 (three) times daily.    [provider]  ketoconazole (NIZORAL) 2 % shampoo Apply 1 application topically 2 (two) times a week.    [provider]  MAGNESIUM PO Take 0.5 tablets by mouth 2 (two) times daily.    [provider]  potassium chloride (K-DUR) 10 MEQ tablet Take 2 tablets (20 mEq total) by mouth daily. 07/10/17 07/15/17  Valentino Nose, MD  sertraline (ZOLOFT) 25 MG tablet Take 50 mg by mouth daily as needed (for anxiety). Takes 1-2 times per week    [provider]  simvastatin (ZOCOR) 80 MG tablet Take 40 mg by mouth daily.    [provider]  spironolactone (ALDACTONE) 25 MG tablet Take 25 mg by mouth daily.    [provider]  tamsulosin (FLOMAX) 0.4 MG CAPS capsule Take 0.4 mg by mouth  at bedtime.    [provider]    Family History Family History  Problem Relation Age of Onset  . Heart disease Mother   . Heart Problems Maternal Grandmother   . Other Unknown        negative for premature CAD    Social History Social History  Substance Use Topics  . Smoking status: Former Smoker    Packs/day: 1.00    Years: 15.00    Types: Cigarettes, Cigars  . Smokeless tobacco: Former Neurosurgeon     Comment: quit smoking ~ 50 yr ago  . Alcohol use No     Comment: rare beer.     Allergies   Lisinopril   Review of Systems Review of Systems  Ten systems reviewed and are negative for acute change, except as noted in  the HPI.   Physical Exam Updated Vital Signs BP 121/78   Pulse 83   Temp 97.8 F (36.6 C) (Oral)   Resp (!) 23   SpO2 91%   Physical Exam  Constitutional: He is oriented to person, place, and time. He appears well-developed and well-nourished. No distress.  HENT:  Head: Normocephalic and atraumatic.  Eyes: Conjunctivae are normal. No scleral icterus.  Neck: Normal range of motion. Neck supple.  Cardiovascular: Normal rate, regular rhythm and normal heart sounds.   Pulmonary/Chest: Effort normal and breath sounds normal. No respiratory distress.  Abdominal: Soft. There is no tenderness.  Musculoskeletal: He exhibits no edema.  Right Foot warm, red, tender over the proximal metatarsals.Consistent with gout flare full range of motion of toes and ankle  Neurological: He is alert and oriented to person, place, and time.  Skin: Skin is warm and dry. He is not diaphoretic.  Psychiatric: His behavior is normal.  Nursing note and vitals reviewed.    ED Treatments / Results  Labs (all labs ordered are listed, but only abnormal results are displayed) Labs Reviewed  CBC WITH DIFFERENTIAL/PLATELET - Abnormal; Notable for the following:       Result Value   RBC 4.15 (*)    Hemoglobin 12.6 (*)    HCT 38.6 (*)    All other components within normal  limits  COMPREHENSIVE METABOLIC PANEL - Abnormal; Notable for the following:    Glucose, Bld 126 (*)    Creatinine, Ser 1.34 (*)    GFR calc non Af Amer 48 (*)    GFR calc Af Amer 56 (*)    All other components within normal limits  URINALYSIS, ROUTINE W REFLEX MICROSCOPIC - Abnormal; Notable for the following:    APPearance CLOUDY (*)    Hgb urine dipstick SMALL (*)    Protein, ur 30 (*)    Nitrite POSITIVE (*)    Leukocytes, UA LARGE (*)    Bacteria, UA FEW (*)    Squamous Epithelial / LPF 0-5 (*)    Non Squamous Epithelial 0-5 (*)    All other components within normal limits  URINE CULTURE  I-STAT TROPONIN, ED    EKG  EKG Interpretation None       Radiology Dg Chest 2 View  Result Date: 07/28/2017 CLINICAL DATA:  Melissa of several hours duration. EXAM: CHEST  2 VIEW COMPARISON:  07/08/2017 FINDINGS: The lungs are clear. Unchanged cardiomegaly, mild. Grossly intact transvenous cardiac lead. Pulmonary vasculature is normal. No large effusion. IMPRESSION: Stable mild cardiomegaly.  No acute cardiopulmonary finding. Electronically Signed   By: Ellery Plunk M.D.   On: 07/28/2017 05:02    Procedures Procedures (including critical care time)  Medications Ordered in ED Medications  oxyCODONE-acetaminophen (PERCOCET/ROXICET) 5-325 MG per tablet 2 tablet (not administered)  cefTRIAXone (ROCEPHIN) injection 1 g (not administered)  ondansetron (ZOFRAN-ODT) disintegrating tablet 4 mg (not administered)     Initial Impression / Assessment and Plan / ED Course  I have reviewed the triage vital signs and the nursing notes.  Pertinent labs & imaging results that were available during my care of the patient were reviewed by me and considered in my medical decision making (see chart for details).     Patient with urinary tract infection. He also has gout. This does not appear to be a septic joint. No evidence of trauma. Patient will be treated with IM Rocephin, pain  medications. He has Norco at home. Urine sent for culture and patient will  be discharged on Keflex to follow up with his primary care physician. No evidence of pyelonephritis or systemic infection. He appears well and safe for discharge at this time.  Final Clinical Impressions(s) / ED Diagnoses   Final diagnoses:  None    New Prescriptions New Prescriptions   No medications on file     Arthor Captain, PA-C 07/28/17 9811    Raeford Razor, MD 08/03/17 [phone]

## 2017-07-28 NOTE — ED Triage Notes (Signed)
Pt comes via [facility] c/o of malaise for the past several house. No other complaints. afib on 12 lead

## 2017-07-29 LAB — URINE CULTURE

## 2017-08-25 ENCOUNTER — Encounter (HOSPITAL_COMMUNITY): Payer: Self-pay | Admitting: Emergency Medicine

## 2017-08-25 ENCOUNTER — Emergency Department (HOSPITAL_COMMUNITY)
Admission: EM | Admit: 2017-08-25 | Discharge: 2017-08-25 | Disposition: A | Discharge status: Home / Self Care | Payer: Non-veteran care | Attending: Emergency Medicine | Admitting: Emergency Medicine

## 2017-08-25 ENCOUNTER — Emergency Department (HOSPITAL_COMMUNITY): Payer: Non-veteran care

## 2017-08-25 DIAGNOSIS — Z7901 Long term (current) use of anticoagulants: Secondary | ICD-10-CM | POA: Insufficient documentation

## 2017-08-25 DIAGNOSIS — E119 Type 2 diabetes mellitus without complications: Secondary | ICD-10-CM | POA: Diagnosis not present

## 2017-08-25 DIAGNOSIS — I110 Hypertensive heart disease with heart failure: Secondary | ICD-10-CM | POA: Insufficient documentation

## 2017-08-25 DIAGNOSIS — R0602 Shortness of breath: Secondary | ICD-10-CM | POA: Diagnosis present

## 2017-08-25 DIAGNOSIS — Z87891 Personal history of nicotine dependence: Secondary | ICD-10-CM | POA: Diagnosis not present

## 2017-08-25 DIAGNOSIS — Z96642 Presence of left artificial hip joint: Secondary | ICD-10-CM | POA: Insufficient documentation

## 2017-08-25 DIAGNOSIS — I5023 Acute on chronic systolic (congestive) heart failure: Secondary | ICD-10-CM | POA: Diagnosis not present

## 2017-08-25 DIAGNOSIS — Z79899 Other long term (current) drug therapy: Secondary | ICD-10-CM | POA: Diagnosis not present

## 2017-08-25 DIAGNOSIS — Z7984 Long term (current) use of oral hypoglycemic drugs: Secondary | ICD-10-CM | POA: Diagnosis not present

## 2017-08-25 LAB — URINALYSIS, ROUTINE W REFLEX MICROSCOPIC
Bilirubin Urine: NEGATIVE
Glucose, UA: NEGATIVE mg/dL
KETONES UR: NEGATIVE mg/dL
Nitrite: POSITIVE — AB
PH: 6 (ref 5.0–8.0)
PROTEIN: NEGATIVE mg/dL
SPECIFIC GRAVITY, URINE: 1.006 (ref 1.005–1.030)

## 2017-08-25 LAB — CBC WITH DIFFERENTIAL/PLATELET
BASOS PCT: 0 %
Basophils Absolute: 0 10*3/uL (ref 0.0–0.1)
EOS PCT: 2 %
Eosinophils Absolute: 0.2 10*3/uL (ref 0.0–0.7)
HEMATOCRIT: 35.8 % — AB (ref 39.0–52.0)
Hemoglobin: 12.2 g/dL — ABNORMAL LOW (ref 13.0–17.0)
LYMPHS PCT: 20 %
Lymphs Abs: 1.7 10*3/uL (ref 0.7–4.0)
MCH: 31.3 pg (ref 26.0–34.0)
MCHC: 34.1 g/dL (ref 30.0–36.0)
MCV: 91.8 fL (ref 78.0–100.0)
MONO ABS: 0.6 10*3/uL (ref 0.1–1.0)
MONOS PCT: 7 %
NEUTROS ABS: 6.1 10*3/uL (ref 1.7–7.7)
Neutrophils Relative %: 71 %
Platelets: 247 10*3/uL (ref 150–400)
RBC: 3.9 MIL/uL — ABNORMAL LOW (ref 4.22–5.81)
RDW: 15.1 % (ref 11.5–15.5)
WBC: 8.5 10*3/uL (ref 4.0–10.5)

## 2017-08-25 LAB — I-STAT TROPONIN, ED
TROPONIN I, POC: 0.04 ng/mL (ref 0.00–0.08)
Troponin i, poc: 0.05 ng/mL (ref 0.00–0.08)

## 2017-08-25 LAB — COMPREHENSIVE METABOLIC PANEL
ALT: 13 U/L — ABNORMAL LOW (ref 17–63)
ANION GAP: 11 (ref 5–15)
AST: 23 U/L (ref 15–41)
Albumin: 3.2 g/dL — ABNORMAL LOW (ref 3.5–5.0)
Alkaline Phosphatase: 60 U/L (ref 38–126)
BILIRUBIN TOTAL: 0.6 mg/dL (ref 0.3–1.2)
BUN: 18 mg/dL (ref 6–20)
CO2: 28 mmol/L (ref 22–32)
Calcium: 8.9 mg/dL (ref 8.9–10.3)
Chloride: 102 mmol/L (ref 101–111)
Creatinine, Ser: 1.32 mg/dL — ABNORMAL HIGH (ref 0.61–1.24)
GFR calc Af Amer: 57 mL/min — ABNORMAL LOW (ref >60)
GFR, EST NON AFRICAN AMERICAN: 49 mL/min — AB (ref >60)
Glucose, Bld: 139 mg/dL — ABNORMAL HIGH (ref 65–99)
POTASSIUM: 3.7 mmol/L (ref 3.5–5.1)
Sodium: 141 mmol/L (ref 135–145)
TOTAL PROTEIN: 7.1 g/dL (ref 6.5–8.1)

## 2017-08-25 LAB — BRAIN NATRIURETIC PEPTIDE: B Natriuretic Peptide: 313.8 pg/mL — ABNORMAL HIGH (ref 0.0–100.0)

## 2017-08-25 MED ORDER — FUROSEMIDE 10 MG/ML IJ SOLN
40.0000 mg | Freq: Once | INTRAMUSCULAR | Status: AC
  Administered 2017-08-25: 40 mg via INTRAVENOUS
  Filled 2017-08-25: qty 4

## 2017-08-25 NOTE — Discharge Instructions (Addendum)
Increase your lasix to 80 mg twice daily for 3 days.    See your cardiologist at the [facility] for follow up   Return to ER if you have worse shortness of breath, chest pain, leg swelling, trouble breathing

## 2017-08-25 NOTE — ED Notes (Signed)
Pt with a 15 run of VTACH, denies cp or sob

## 2017-08-25 NOTE — ED Notes (Signed)
Ambulated Pt in Hallway without O2. Pt started at 96% on room air. Pt walked from D35 to the bathroom on Pod D. Pt stayed around 96% while walking.

## 2017-08-25 NOTE — ED Provider Notes (Addendum)
[facility] DEPT Provider Note   CSN: [id number] Arrival date & time: 08/25/17  1630     History   Chief Complaint Chief Complaint  Patient presents with  . Shortness of Breath    HPI Ryan Macdonald is a 83 y.o. male history of diabetes, CHF, hypertension, A. fib on eliquis, here presenting with shortness of breath. Patient sates that about 2 hours prior to arrival, he started developing some shortness of breath. Patient has some chest pressure that improved. Patient states that he has some leg swelling that is chronic. He has been taking his weight daily and has been stable. Patient arrived by EMS. His oxygen was about 94% on room air per EMS. He has been taking liquids as prescribed   The history is provided by the patient.    Past Medical History:  Diagnosis Date  . Cat scratch fever    removed mass on right side neck  . Cataracts, bilateral   . CHF (congestive heart failure) (HCC)   . Diabetes mellitus without complication (HCC)    TYPE 2  . High cholesterol   . Hypertension   . Kidney stones   . Morbid obesity (HCC)   . Nonischemic cardiomyopathy (HCC)    a. ? 2009 Cath in [place] - nl cors per pt;  b. 09/2013 Echo: EF 25-30%, sev diff HK.  . Osteoarthritis   . PAF (paroxysmal atrial fibrillation) (HCC)    a. post-op hip in 2014 - prev on xarelto.  . Renal insufficiency   . Shortness of breath dyspnea     Patient Active Problem List   Diagnosis Date Noted  . Renal insufficiency   . Acute on chronic systolic CHF (congestive heart failure) (HCC) 07/08/2017  . Left knee pain 07/08/2017  . Acute bronchitis 05/10/2017  . Acute congestive heart failure (HCC) 04/14/2017  . Elevated troponin 04/14/2017  . Lactic acidosis   . Acute on chronic systolic heart failure, NYHA class 2 (HCC) 02/03/2017  . Diabetes mellitus type 2 in obese (HCC) 02/03/2017  . CAP (community acquired pneumonia) 02/03/2017  . Lobar pneumonia (HCC)   . Diabetes mellitus with complication (HCC)     . Acute on chronic respiratory failure with hypoxia (HCC)   . Acute on chronic combined systolic (congestive) and diastolic (congestive) heart failure (HCC) 05/19/2016  . Depression 05/19/2016  . Gout 05/19/2016  . 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 NSVT (nonsustained ventricular tachycardia) (HCC)   . Chronic renal insufficiency, stage III (moderate)   . Pulmonary embolism (HCC)   . Acute respiratory failure with hypoxia (HCC)   . Osteoarthritis of left hip 10/25/2013  . Atrial fibrillation (HCC) 10/25/2013  . S/P hip replacement 10/24/2013  . Nonischemic cardiomyopathy (HCC)   . Obesity (BMI 30-39.9)   . Essential hypertension   . Hyperlipidemia     Past Surgical History:  Procedure Laterality Date  . CARDIAC CATHETERIZATION  1980's  . CIRCUMCISION  1940's  . CYSTOSCOPY WITH URETHRAL DILATATION  10/23/2013  . CYSTOSCOPY WITH URETHRAL DILATATION N/A 10/23/2013   Procedure: CYSTOSCOPY WITH URETHRAL DILATATION;  Surgeon: Kathi Ludwig, MD;  Location: [facility] OR;  Service: Urology;  Laterality: N/A;  . INCISION AND DRAINAGE OF WOUND Right 1980's   "cat scratch" (10/23/2013)  . INGUINAL HERNIA REPAIR Right 1950's  . KIDNEY STONE SURGERY Right 1970's; 1983   "twice"  . TONSILLECTOMY AND ADENOIDECTOMY  1940's  . TOTAL HIP ARTHROPLASTY Left 10/23/2013  . TOTAL HIP ARTHROPLASTY Left 10/23/2013  Procedure: TOTAL HIP ARTHROPLASTY;  Surgeon: Valeria Batman, MD;  Location: [facility] OR;  Service: Orthopedics;  Laterality: Left;       Home Medications    Prior to Admission medications   Medication Sig Start Date End Date Taking? Authorizing Provider  allopurinol (ZYLOPRIM) 100 MG tablet Take 200 mg by mouth daily.   Yes [provider]  apixaban (ELIQUIS) 5 MG TABS tablet Take 1 tablet (5 mg total) by mouth 2 (two) times daily. 02/06/17  Yes Amin, Loura Halt, MD  carvedilol (COREG) 12.5 MG tablet Take 0.5 tablets (6.25 mg total) by mouth 2  (two) times daily with a meal. 04/15/17  Yes Osvaldo Shipper, MD  cyanocobalamin 1000 MCG tablet Take 1,000 mcg by mouth daily.    Yes [provider]  diclofenac sodium (VOLTAREN) 1 % GEL Apply 4 g topically 4 (four) times daily. 07/10/17  Yes Valentino Nose, MD  diphenhydrAMINE (BENADRYL) 25 mg capsule Take 25 mg by mouth at bedtime as needed for sleep.   Yes [provider]  finasteride (PROSCAR) 5 MG tablet Take 5 mg by mouth daily.   Yes [provider]  furosemide (LASIX) 20 MG tablet Take 3 tablets (60 mg total) by mouth 2 (two) times daily. 07/10/17  Yes Valentino Nose, MD  glipiZIDE (GLUCOTROL) 5 MG tablet Take 5 mg by mouth daily before breakfast.   Yes [provider]  hydrALAZINE (APRESOLINE) 25 MG tablet Take 25 mg by mouth 3 (three) times daily.   Yes [provider]  HYDROcodone-acetaminophen (NORCO/VICODIN) 5-325 MG tablet Take 2 tablets by mouth every 4 (four) hours as needed. Patient taking differently: Take 1 tablet by mouth 2 (two) times daily.  04/09/16  Yes Isa Rankin, MD  ipratropium-albuterol (DUONEB) 0.5-2.5 (3) MG/3ML SOLN Take 3 mLs by nebulization every 4 (four) hours as needed. 05/14/17  Yes Pearson Grippe, MD  isosorbide dinitrate (DILATRATE-SR) 40 MG CR capsule Take 40 mg by mouth 3 (three) times daily.   Yes [provider]  ketoconazole (NIZORAL) 2 % shampoo Apply 1 application topically 2 (two) times a week.   Yes [provider]  MAGNESIUM PO Take 0.5 tablets by mouth 2 (two) times daily.   Yes [provider]  potassium chloride (K-DUR) 10 MEQ tablet Take 2 tablets (20 mEq total) by mouth daily. 07/10/17 08/25/17 Yes Valentino Nose, MD  sertraline (ZOLOFT) 25 MG tablet Take 50 mg by mouth daily as needed (for anxiety). Takes 1-2 times per week   Yes [provider]  simvastatin (ZOCOR) 80 MG tablet Take 40 mg by mouth daily.   Yes [provider]  spironolactone (ALDACTONE) 25 MG  tablet Take 25 mg by mouth daily.   Yes [provider]  tamsulosin (FLOMAX) 0.4 MG CAPS capsule Take 0.4 mg by mouth at bedtime.   Yes [provider]  cephALEXin (KEFLEX) 500 MG capsule 2 caps po bid x 7 days Patient not taking: Reported on 08/25/2017 07/28/17   Arthor Captain, PA-C    Family History Family History  Problem Relation Age of Onset  . Heart disease Mother   . Heart Problems Maternal Grandmother   . Other Unknown        negative for premature CAD    Social History Social History  Substance Use Topics  . Smoking status: Former Smoker    Packs/day: 1.00    Years: 15.00    Types: Cigarettes, Cigars  . Smokeless tobacco: Former Neurosurgeon  Comment: quit smoking ~ 50 yr ago  . Alcohol use No     Comment: rare beer.     Allergies   Lisinopril   Review of Systems Review of Systems  Respiratory: Positive for shortness of breath.   All other systems reviewed and are negative.    Physical Exam Updated Vital Signs BP 130/84   Pulse 85   Temp 98 F (36.7 C) (Oral)   Resp (!) 21   SpO2 100%   Physical Exam  Constitutional: He is oriented to person, place, and time. He appears well-developed.  HENT:  Head: Normocephalic.  Mouth/Throat: Oropharynx is clear and moist.  Eyes: Pupils are equal, round, and reactive to light. Conjunctivae and EOM are normal.  Neck: Normal range of motion. Neck supple.  Cardiovascular: Normal rate, regular rhythm and normal heart sounds.   Pulmonary/Chest: Effort normal.  Diminished bilateral bases   Abdominal: Soft. Bowel sounds are normal. He exhibits no distension. There is no tenderness. There is no guarding.  Musculoskeletal: Normal range of motion.  Trace edema bilaterally   Neurological: He is alert and oriented to person, place, and time. No cranial nerve deficit. Coordination normal.  Skin: Skin is warm.  Psychiatric: He has a normal mood and affect.  Nursing note and vitals reviewed.    ED  Treatments / Results  Labs (all labs ordered are listed, but only abnormal results are displayed) Labs Reviewed  CBC WITH DIFFERENTIAL/PLATELET - Abnormal; Notable for the following:       Result Value   RBC 3.90 (*)    Hemoglobin 12.2 (*)    HCT 35.8 (*)    All other components within normal limits  COMPREHENSIVE METABOLIC PANEL - Abnormal; Notable for the following:    Glucose, Bld 139 (*)    Creatinine, Ser 1.32 (*)    Albumin 3.2 (*)    ALT 13 (*)    GFR calc non Af Amer 49 (*)    GFR calc Af Amer 57 (*)    All other components within normal limits  BRAIN NATRIURETIC PEPTIDE - Abnormal; Notable for the following:    B Natriuretic Peptide 313.8 (*)    All other components within normal limits  URINALYSIS, ROUTINE W REFLEX MICROSCOPIC  I-STAT TROPONIN, ED  I-STAT TROPONIN, ED    EKG  EKG Interpretation  Date/Time:  Thursday August 25 2017 16:34:46 EDT Ventricular Rate:  89 PR Interval:    QRS Duration: 100 QT Interval:  374 QTC Calculation: 456 R Axis:   19 Text Interpretation:  Atrial fibrillation Borderline low voltage, extremity leads RSR' in V1 or V2, probably normal variant Nonspecific T abnormalities, lateral leads No significant change since last tracing Confirmed by Richardean Canal [phone]) on 08/25/2017 4:41:14 PM       Radiology Dg Chest 2 View  Result Date: 08/25/2017 CLINICAL DATA:  Shortness of breath EXAM: CHEST  2 VIEW COMPARISON:  07/29/2007 FINDINGS: Left-sided single lead pacing device as before. Mild cardiomegaly. No acute consolidation or pleural effusion. Negative for a pneumothorax. Degenerative changes of the spine. IMPRESSION: Stable cardiomegaly.  Negative for edema or infiltrate. Electronically Signed   By: Jasmine Pang M.D.   On: 08/25/2017 17:34    Procedures Procedures (including critical care time)  Medications Ordered in ED Medications  furosemide (LASIX) injection 40 mg (40 mg Intravenous Given 08/25/17 1908)     Initial  Impression / Assessment and Plan / ED Course  I have reviewed the triage vital signs and  the nursing notes.  Pertinent labs & imaging results that were available during my care of the patient were reviewed by me and considered in my medical decision making (see chart for details).    Madden Garron is a 83 y.o. male here with shortness of breath. Has hx of CHF. Consider CHF exacerbation vs ACS. Will get labs, BNP, CXR, trop x 2.   9 pm Trop neg x 2, BNP 300. However, he had some palpitations and chest pressure and the monitor showed Vtach about 4-5 beats. Upon review of his tracing in the ED, he has nonsustained Vtach. I called Dr. Antoine Poche.  10:59 PM Dr. Antoine Poche reviewed tracing. He states that patient has EF 15% at baseline and is expected to have ectopy on EKG. He has defibrillator as well that didn't go off. He recommend continue medical management. He is not hypoxic, felt better with lasix. Will increase lasix to 80 mg BID x 3 days, encourage him to follow up with his cardiologist at [facility].    Final Clinical Impressions(s) / ED Diagnoses   Final diagnoses:  None    New Prescriptions New Prescriptions   No medications on file     Charlynne Pander, MD 08/25/17 2302    Charlynne Pander, MD 08/25/17 [phone]

## 2017-08-25 NOTE — ED Triage Notes (Signed)
Patient arrived to ED from home via [facility]. EMS reports:   Patient called EMS for increased shortness of breath this afternoon. Patient has hx of CHF & Atrial fibrillation.  Clear lungs. 94% on room air, 99% on 2 LPM via nasal cannula.  BP 130/96, Pulse 84. CBG 124.  Patient takes Eliquis.  Defibrillator.

## 2017-08-25 NOTE — ED Notes (Signed)
ED Provider at bedside. 

## 2017-09-17 ENCOUNTER — Emergency Department (HOSPITAL_COMMUNITY)
Admission: EM | Admit: 2017-09-17 | Discharge: 2017-09-17 | Disposition: A | Discharge status: Home / Self Care | Payer: Non-veteran care | Attending: Emergency Medicine | Admitting: Emergency Medicine

## 2017-09-17 ENCOUNTER — Encounter (HOSPITAL_COMMUNITY): Payer: Self-pay | Admitting: *Deleted

## 2017-09-17 ENCOUNTER — Emergency Department (HOSPITAL_COMMUNITY): Payer: Non-veteran care

## 2017-09-17 DIAGNOSIS — E119 Type 2 diabetes mellitus without complications: Secondary | ICD-10-CM | POA: Insufficient documentation

## 2017-09-17 DIAGNOSIS — I5033 Acute on chronic diastolic (congestive) heart failure: Secondary | ICD-10-CM | POA: Insufficient documentation

## 2017-09-17 DIAGNOSIS — Z79899 Other long term (current) drug therapy: Secondary | ICD-10-CM | POA: Diagnosis not present

## 2017-09-17 DIAGNOSIS — I110 Hypertensive heart disease with heart failure: Secondary | ICD-10-CM | POA: Insufficient documentation

## 2017-09-17 DIAGNOSIS — Z87891 Personal history of nicotine dependence: Secondary | ICD-10-CM | POA: Insufficient documentation

## 2017-09-17 DIAGNOSIS — Z96642 Presence of left artificial hip joint: Secondary | ICD-10-CM | POA: Insufficient documentation

## 2017-09-17 DIAGNOSIS — Z7901 Long term (current) use of anticoagulants: Secondary | ICD-10-CM | POA: Insufficient documentation

## 2017-09-17 DIAGNOSIS — R0602 Shortness of breath: Secondary | ICD-10-CM | POA: Diagnosis not present

## 2017-09-17 LAB — I-STAT TROPONIN, ED
TROPONIN I, POC: 0.06 ng/mL (ref 0.00–0.08)
Troponin i, poc: 0.05 ng/mL (ref 0.00–0.08)

## 2017-09-17 LAB — BASIC METABOLIC PANEL
ANION GAP: 12 (ref 5–15)
BUN: 21 mg/dL — ABNORMAL HIGH (ref 6–20)
CALCIUM: 8.5 mg/dL — AB (ref 8.9–10.3)
CO2: 26 mmol/L (ref 22–32)
Chloride: 101 mmol/L (ref 101–111)
Creatinine, Ser: 1.19 mg/dL (ref 0.61–1.24)
GFR, EST NON AFRICAN AMERICAN: 55 mL/min — AB (ref >60)
GLUCOSE: 115 mg/dL — AB (ref 65–99)
Potassium: 3.3 mmol/L — ABNORMAL LOW (ref 3.5–5.1)
Sodium: 139 mmol/L (ref 135–145)

## 2017-09-17 LAB — CBC WITH DIFFERENTIAL/PLATELET
BASOS ABS: 0 10*3/uL (ref 0.0–0.1)
BASOS PCT: 0 %
EOS ABS: 0.2 10*3/uL (ref 0.0–0.7)
Eosinophils Relative: 2 %
HCT: 34.8 % — ABNORMAL LOW (ref 39.0–52.0)
Hemoglobin: 11.4 g/dL — ABNORMAL LOW (ref 13.0–17.0)
Lymphocytes Relative: 15 %
Lymphs Abs: 1.4 10*3/uL (ref 0.7–4.0)
MCH: 30.2 pg (ref 26.0–34.0)
MCHC: 32.8 g/dL (ref 30.0–36.0)
MCV: 92.3 fL (ref 78.0–100.0)
MONO ABS: 0.8 10*3/uL (ref 0.1–1.0)
MONOS PCT: 9 %
NEUTROS PCT: 74 %
Neutro Abs: 6.7 10*3/uL (ref 1.7–7.7)
Platelets: 235 10*3/uL (ref 150–400)
RBC: 3.77 MIL/uL — ABNORMAL LOW (ref 4.22–5.81)
RDW: 14.5 % (ref 11.5–15.5)
WBC: 9.1 10*3/uL (ref 4.0–10.5)

## 2017-09-17 LAB — BRAIN NATRIURETIC PEPTIDE: B NATRIURETIC PEPTIDE 5: 251.4 pg/mL — AB (ref 0.0–100.0)

## 2017-09-17 MED ORDER — HYDROCODONE-ACETAMINOPHEN 5-325 MG PO TABS
2.0000 | ORAL_TABLET | Freq: Once | ORAL | Status: AC
  Administered 2017-09-17: 2 via ORAL
  Filled 2017-09-17: qty 2

## 2017-09-17 MED ORDER — POTASSIUM CHLORIDE CRYS ER 20 MEQ PO TBCR
40.0000 meq | EXTENDED_RELEASE_TABLET | Freq: Once | ORAL | Status: AC
  Administered 2017-09-17: 40 meq via ORAL
  Filled 2017-09-17: qty 2

## 2017-09-17 NOTE — ED Provider Notes (Signed)
[name] EMERGENCY DEPARTMENT Provider Note   CSN: [id number] Arrival date & time: 09/17/17  1632     History   Chief Complaint Chief Complaint  Patient presents with  . Shortness of Breath    HPI Collan Schoenfeld is a 83 y.o. male.  83 year old male history of CHF (EF 15-20% in 01/2017) s/p AICD, A. fib on Eliquis, PE, T2DM who presents with generalized malaise and intermittent shortness of breath.  He describes onset of symptoms this evening while watching TV.  Difficulty elaboratin on symptoms but he felt off with malaise. He was seen 3 weeks ago for similar symptoms.  Has been compliant with his medications including Lasix.  Has noted several pound weight gain over the past several weeks.  Denies any fevers or cough.  Currently without chest pain or shortness of breath.  The history is provided by the patient. No language interpreter was used.    Past Medical History:  Diagnosis Date  . Cat scratch fever    removed mass on right side neck  . Cataracts, bilateral   . CHF (congestive heart failure) (HCC)   . Diabetes mellitus without complication (HCC)    TYPE 2  . High cholesterol   . Hypertension   . Kidney stones   . Morbid obesity (HCC)   . Nonischemic cardiomyopathy (HCC)    a. ? 2009 Cath in [place] - nl cors per pt;  b. 09/2013 Echo: EF 25-30%, sev diff HK.  . Osteoarthritis   . PAF (paroxysmal atrial fibrillation) (HCC)    a. post-op hip in 2014 - prev on xarelto.  . Renal insufficiency     Patient Active Problem List   Diagnosis Date Noted  . Renal insufficiency   . Acute on chronic systolic CHF (congestive heart failure) (HCC) 07/08/2017  . Left knee pain 07/08/2017  . Acute bronchitis 05/10/2017  . Acute congestive heart failure (HCC) 04/14/2017  . Elevated troponin 04/14/2017  . Lactic acidosis   . Acute on chronic systolic heart failure, NYHA class 2 (HCC) 02/03/2017  . Diabetes mellitus type 2 in obese (HCC) 02/03/2017  . CAP  (community acquired pneumonia) 02/03/2017  . Lobar pneumonia (HCC)   . Diabetes mellitus with complication (HCC)   . Acute on chronic respiratory failure with hypoxia (HCC)   . Acute on chronic combined systolic (congestive) and diastolic (congestive) heart failure (HCC) 05/19/2016  . Depression 05/19/2016  . Gout 05/19/2016  . 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 NSVT (nonsustained ventricular tachycardia) (HCC)   . Chronic renal insufficiency, stage III (moderate) (HCC)   . Pulmonary embolism (HCC)   . Acute respiratory failure with hypoxia (HCC)   . Osteoarthritis of left hip 10/25/2013  . Atrial fibrillation (HCC) 10/25/2013  . S/P hip replacement 10/24/2013  . Nonischemic cardiomyopathy (HCC)   . Obesity (BMI 30-39.9)   . Essential hypertension   . Hyperlipidemia     Past Surgical History:  Procedure Laterality Date  . CARDIAC CATHETERIZATION  1980's  . CIRCUMCISION  1940's  . CYSTOSCOPY WITH URETHRAL DILATATION  10/23/2013  . CYSTOSCOPY WITH URETHRAL DILATATION N/A 10/23/2013   Procedure: CYSTOSCOPY WITH URETHRAL DILATATION;  Surgeon: Kathi Ludwig, MD;  Location: [facility] OR;  Service: Urology;  Laterality: N/A;  . INCISION AND DRAINAGE OF WOUND Right 1980's   "cat scratch" (10/23/2013)  . INGUINAL HERNIA REPAIR Right 1950's  . KIDNEY STONE SURGERY Right 1970's; 1983   "twice"  . TONSILLECTOMY AND ADENOIDECTOMY  1940's  .  TOTAL HIP ARTHROPLASTY Left 10/23/2013  . TOTAL HIP ARTHROPLASTY Left 10/23/2013   Procedure: TOTAL HIP ARTHROPLASTY;  Surgeon: Valeria Batman, MD;  Location: [facility] OR;  Service: Orthopedics;  Laterality: Left;       Home Medications    Prior to Admission medications   Medication Sig Start Date End Date Taking? Authorizing Provider  allopurinol (ZYLOPRIM) 100 MG tablet Take 200 mg by mouth daily.   Yes [provider]  apixaban (ELIQUIS) 5 MG TABS tablet Take 1 tablet (5 mg total) by mouth 2 (two) times  daily. 02/06/17  Yes Amin, Loura Halt, MD  carvedilol (COREG) 12.5 MG tablet Take 0.5 tablets (6.25 mg total) by mouth 2 (two) times daily with a meal. 04/15/17  Yes Osvaldo Shipper, MD  cyanocobalamin 1000 MCG tablet Take 1,000 mcg by mouth daily.    Yes [provider]  diclofenac sodium (VOLTAREN) 1 % GEL Apply 4 g topically 4 (four) times daily. 07/10/17  Yes Valentino Nose, MD  diphenhydrAMINE (BENADRYL) 25 mg capsule Take 25 mg by mouth at bedtime as needed for sleep.   Yes [provider]  finasteride (PROSCAR) 5 MG tablet Take 5 mg by mouth daily.   Yes [provider]  furosemide (LASIX) 20 MG tablet Take 3 tablets (60 mg total) by mouth 2 (two) times daily. 07/10/17  Yes Valentino Nose, MD  glipiZIDE (GLUCOTROL) 5 MG tablet Take 5 mg by mouth daily before breakfast.   Yes [provider]  hydrALAZINE (APRESOLINE) 25 MG tablet Take 25 mg by mouth 3 (three) times daily.   Yes [provider]  HYDROcodone-acetaminophen (NORCO/VICODIN) 5-325 MG tablet Take 2 tablets by mouth every 4 (four) hours as needed. Patient taking differently: Take 1 tablet by mouth 2 (two) times daily.  04/09/16  Yes Isa Rankin, MD  ipratropium-albuterol (DUONEB) 0.5-2.5 (3) MG/3ML SOLN Take 3 mLs by nebulization every 4 (four) hours as needed. 05/14/17  Yes Pearson Grippe, MD  isosorbide dinitrate (DILATRATE-SR) 40 MG CR capsule Take 40 mg by mouth 3 (three) times daily.   Yes [provider]  ketoconazole (NIZORAL) 2 % shampoo Apply 1 application topically 2 (two) times a week.   Yes [provider]  MAGNESIUM PO Take 0.5 tablets by mouth 2 (two) times daily.   Yes [provider]  potassium chloride (K-DUR) 10 MEQ tablet Take 2 tablets (20 mEq total) by mouth daily. 07/10/17 09/17/17 Yes Valentino Nose, MD  sertraline (ZOLOFT) 25 MG tablet Take 50 mg by mouth daily as needed (for anxiety). Takes 1-2 times per week   Yes [provider]    simvastatin (ZOCOR) 80 MG tablet Take 40 mg by mouth daily.   Yes [provider]  spironolactone (ALDACTONE) 25 MG tablet Take 25 mg by mouth daily.   Yes [provider]  tamsulosin (FLOMAX) 0.4 MG CAPS capsule Take 0.4 mg by mouth at bedtime.   Yes [provider]  cephALEXin (KEFLEX) 500 MG capsule 2 caps po bid x 7 days Patient not taking: Reported on 08/25/2017 07/28/17   Arthor Captain, PA-C    Family History Family History  Problem Relation Age of Onset  . Heart disease Mother   . Heart Problems Maternal Grandmother   . Other Unknown        negative for premature CAD    Social History Social History  Substance Use Topics  . Smoking status: Former Smoker    Packs/day: 1.00    Years: 15.00  Types: Cigarettes, Cigars  . Smokeless tobacco: Former Neurosurgeon     Comment: quit smoking ~ 50 yr ago  . Alcohol use No     Comment: rare beer.     Allergies   Lisinopril   Review of Systems Review of Systems  Constitutional: Negative for chills and fever.  HENT: Negative for ear pain and sore throat.   Eyes: Negative for pain and visual disturbance.  Respiratory: Positive for shortness of breath. Negative for cough.   Cardiovascular: Negative for chest pain and palpitations.  Gastrointestinal: Negative for abdominal pain and vomiting.  Genitourinary: Negative for dysuria and hematuria.  Musculoskeletal: Negative for arthralgias and back pain.  Skin: Negative for color change and rash.  Neurological: Negative for seizures and syncope.  All other systems reviewed and are negative.    Physical Exam Updated Vital Signs BP 114/79   Pulse 84   Temp 98.1 F (36.7 C) (Oral)   Resp 20   SpO2 98%   Physical Exam  Constitutional: He appears well-developed and well-nourished.  HENT:  Head: Normocephalic and atraumatic.  Eyes: Conjunctivae are normal.  Neck: Neck supple.  Cardiovascular: Normal rate and regular rhythm.   No murmur  heard. Pulmonary/Chest: Effort normal and breath sounds normal. No respiratory distress.  Abdominal: Soft. There is no tenderness.  Musculoskeletal: He exhibits no edema.  Neurological: He is alert. No cranial nerve deficit. Coordination normal.  5/5 motor strength and intact sensation in all extremities. Intact bilateral finger-to-nose coordination  Skin: Skin is warm and dry.  Nursing note and vitals reviewed.    ED Treatments / Results  Labs (all labs ordered are listed, but only abnormal results are displayed) Labs Reviewed  BASIC METABOLIC PANEL - Abnormal; Notable for the following:       Result Value   Potassium 3.3 (*)    Glucose, Bld 115 (*)    BUN 21 (*)    Calcium 8.5 (*)    GFR calc non Af Amer 55 (*)    All other components within normal limits  CBC WITH DIFFERENTIAL/PLATELET - Abnormal; Notable for the following:    RBC 3.77 (*)    Hemoglobin 11.4 (*)    HCT 34.8 (*)    All other components within normal limits  BRAIN NATRIURETIC PEPTIDE - Abnormal; Notable for the following:    B Natriuretic Peptide 251.4 (*)    All other components within normal limits  I-STAT TROPONIN, ED  I-STAT TROPONIN, ED    EKG  EKG Interpretation None       Radiology Dg Chest 2 View  Result Date: 09/17/2017 CLINICAL DATA:  Shortness of breath.  Atrial fibrillation. EXAM: CHEST  2 VIEW COMPARISON:  08/25/2017 FINDINGS: Heart size remains stable. Transvenous pacemaker remains in appropriate position. Aortic atherosclerosis. Both lungs are clear. IMPRESSION: No active cardiopulmonary disease. Electronically Signed   By: Myles Rosenthal M.D.   On: 09/17/2017 17:45    Procedures Procedures (including critical care time)  Medications Ordered in ED Medications  HYDROcodone-acetaminophen (NORCO/VICODIN) 5-325 MG per tablet 2 tablet (2 tablets Oral Given 09/17/17 1917)  potassium chloride SA (K-DUR,KLOR-CON) CR tablet 40 mEq (40 mEq Oral Given 09/17/17 2208)     Initial Impression  / Assessment and Plan / ED Course  I have reviewed the triage vital signs and the nursing notes.  Pertinent labs & imaging results that were available during my care of the patient were reviewed by me and considered in my medical decision making (see chart for  details).     [age] yoM h/o CHF (EF 15-20% in 01/2017) s/p AICD, A. fib on Eliquis, PE, T2DM who p/w vague shortness of breath. Resolved on arrival. Lungs CTAB. Abdomen soft, benign throughout. Appears euvolemic.  Potassium replaced. Labs otherwise unremarkable. CXR showing NACPA. Serial troponins downtrending and wnl. Pt remains asymptomatic. No evidence of CHF exacerbation. Doubt PE on eliquis. Pt stable for continued outpatient followup.  Return precautions provided for worsening symptoms. Pt will f/u with PCP at first availability. Pt verbalized agreement with plan.  Pt care d/w Dr. Erma HeritageIsaacs  Final Clinical Impressions(s) / ED Diagnoses   Final diagnoses:  Shortness of breath    New Prescriptions Discharge Medication List as of 09/17/2017  9:50 PM       Jettie Lazare, Homero FellersFrank, MD 09/18/17 [id number]    Shaune PollackIsaacs, Cameron, MD 09/19/17 [phone]

## 2017-09-17 NOTE — ED Notes (Signed)
Pt back from x-ray.

## 2017-09-17 NOTE — ED Triage Notes (Signed)
Pt with acute onset sob while 137/90, hr 89,rr20, O2 100% 2L cbg 124, EKG unremarkable per [facility].  Placed on 2L [place] for sob and pt stated some relief of sob with O2.

## 2017-09-17 NOTE — ED Notes (Signed)
Pt ambulated with steady gate independently, 02 sat between 91-95 while walking.

## 2017-09-17 NOTE — ED Notes (Signed)
EDP explained tests results and plan of care to pt. and family . 

## 2017-09-17 NOTE — Discharge Instructions (Signed)
Return to ED for worsening symptoms. Please continue fluid hydration and medications as scheduled. Return to ED for worsening symptoms.

## 2017-09-18 NOTE — ED Provider Notes (Signed)
I saw and evaluated the patient, reviewed the resident's note and I agree with the findings and plan. I was present and provided direct supervision for all procedures. Please see associated encounter note. Briefly, the patient is a 83 yo M with extensive PMHx including known AFib s/p AICD, severe CHF here with transient feeling of mild SOB. This occurs frequently per his report, but he is always concerned re: his fluid status so he presented to the ED. No CP. EKG non-ischemic and trop neg x 2. CXR without edema. He is satting well on RA. He is not overtly hypervolemic and BNP is below/at baseline. He has mild hypokalemia which is repleted. Pt has h/o chronic sx similar to this 2/2 his CHF and he feels improved in ED, requesting to go home. He is a retired Engineer, civil (consulting) and states he is well aware of sx of ACS/CHF, states he would like to return home as he just wanted to make sure his CXR, BNP were at baseline. I feel this is reasonable. Discussed reasons for return. No CP, trop neg x 2.    EKG Interpretation  Date/Time:  Saturday September 17 2017 16:42:52 EDT Ventricular Rate:  85 PR Interval:    QRS Duration: 102 QT Interval:  387 QTC Calculation: 461 R Axis:   36 Text Interpretation:  Atrial fibrillation Borderline repolarization abnormality Baseline wander in lead(s) V6 No significant change since last tracing Confirmed by Shaune Pollack [phone]) on 09/18/2017 12:06:11 PM         Shaune Pollack, MD 09/18/17 1208

## 2017-09-27 ENCOUNTER — Emergency Department (HOSPITAL_COMMUNITY): Payer: Non-veteran care

## 2017-09-27 ENCOUNTER — Encounter (HOSPITAL_COMMUNITY): Payer: Self-pay | Admitting: Emergency Medicine

## 2017-09-27 ENCOUNTER — Observation Stay (HOSPITAL_COMMUNITY)
Admission: EM | Admit: 2017-09-27 | Discharge: 2017-09-28 | Disposition: A | Discharge status: Home / Self Care | Payer: Non-veteran care | Attending: Student in an Organized Health Care Education/Training Program | Admitting: Student in an Organized Health Care Education/Training Program

## 2017-09-27 DIAGNOSIS — Z8249 Family history of ischemic heart disease and other diseases of the circulatory system: Secondary | ICD-10-CM | POA: Diagnosis not present

## 2017-09-27 DIAGNOSIS — Z833 Family history of diabetes mellitus: Secondary | ICD-10-CM | POA: Diagnosis not present

## 2017-09-27 DIAGNOSIS — N400 Enlarged prostate without lower urinary tract symptoms: Secondary | ICD-10-CM | POA: Insufficient documentation

## 2017-09-27 DIAGNOSIS — I480 Paroxysmal atrial fibrillation: Secondary | ICD-10-CM | POA: Diagnosis present

## 2017-09-27 DIAGNOSIS — M109 Gout, unspecified: Secondary | ICD-10-CM | POA: Diagnosis not present

## 2017-09-27 DIAGNOSIS — R0603 Acute respiratory distress: Secondary | ICD-10-CM

## 2017-09-27 DIAGNOSIS — F419 Anxiety disorder, unspecified: Secondary | ICD-10-CM | POA: Insufficient documentation

## 2017-09-27 DIAGNOSIS — D631 Anemia in chronic kidney disease: Secondary | ICD-10-CM | POA: Insufficient documentation

## 2017-09-27 DIAGNOSIS — J189 Pneumonia, unspecified organism: Secondary | ICD-10-CM | POA: Diagnosis present

## 2017-09-27 DIAGNOSIS — Z87442 Personal history of urinary calculi: Secondary | ICD-10-CM | POA: Diagnosis not present

## 2017-09-27 DIAGNOSIS — E7800 Pure hypercholesterolemia, unspecified: Secondary | ICD-10-CM | POA: Insufficient documentation

## 2017-09-27 DIAGNOSIS — I130 Hypertensive heart and chronic kidney disease with heart failure and stage 1 through stage 4 chronic kidney disease, or unspecified chronic kidney disease: Secondary | ICD-10-CM | POA: Diagnosis not present

## 2017-09-27 DIAGNOSIS — Z79899 Other long term (current) drug therapy: Secondary | ICD-10-CM | POA: Insufficient documentation

## 2017-09-27 DIAGNOSIS — Z9841 Cataract extraction status, right eye: Secondary | ICD-10-CM | POA: Insufficient documentation

## 2017-09-27 DIAGNOSIS — Z23 Encounter for immunization: Secondary | ICD-10-CM | POA: Insufficient documentation

## 2017-09-27 DIAGNOSIS — I509 Heart failure, unspecified: Secondary | ICD-10-CM

## 2017-09-27 DIAGNOSIS — J9601 Acute respiratory failure with hypoxia: Secondary | ICD-10-CM

## 2017-09-27 DIAGNOSIS — Z8489 Family history of other specified conditions: Secondary | ICD-10-CM | POA: Diagnosis not present

## 2017-09-27 DIAGNOSIS — Z888 Allergy status to other drugs, medicaments and biological substances status: Secondary | ICD-10-CM | POA: Diagnosis not present

## 2017-09-27 DIAGNOSIS — N183 Chronic kidney disease, stage 3 (moderate): Secondary | ICD-10-CM | POA: Insufficient documentation

## 2017-09-27 DIAGNOSIS — Z9842 Cataract extraction status, left eye: Secondary | ICD-10-CM | POA: Insufficient documentation

## 2017-09-27 DIAGNOSIS — Z87891 Personal history of nicotine dependence: Secondary | ICD-10-CM | POA: Diagnosis not present

## 2017-09-27 DIAGNOSIS — E1122 Type 2 diabetes mellitus with diabetic chronic kidney disease: Secondary | ICD-10-CM | POA: Insufficient documentation

## 2017-09-27 DIAGNOSIS — Z6841 Body Mass Index (BMI) 40.0 and over, adult: Secondary | ICD-10-CM | POA: Diagnosis not present

## 2017-09-27 DIAGNOSIS — M1990 Unspecified osteoarthritis, unspecified site: Secondary | ICD-10-CM | POA: Insufficient documentation

## 2017-09-27 DIAGNOSIS — I428 Other cardiomyopathies: Secondary | ICD-10-CM | POA: Insufficient documentation

## 2017-09-27 DIAGNOSIS — Z95810 Presence of automatic (implantable) cardiac defibrillator: Secondary | ICD-10-CM | POA: Insufficient documentation

## 2017-09-27 DIAGNOSIS — E785 Hyperlipidemia, unspecified: Secondary | ICD-10-CM | POA: Insufficient documentation

## 2017-09-27 DIAGNOSIS — Z7901 Long term (current) use of anticoagulants: Secondary | ICD-10-CM | POA: Insufficient documentation

## 2017-09-27 DIAGNOSIS — I4891 Unspecified atrial fibrillation: Secondary | ICD-10-CM | POA: Diagnosis present

## 2017-09-27 HISTORY — DX: Acute respiratory failure with hypoxia: J96.01

## 2017-09-27 HISTORY — DX: Heart failure, unspecified: I50.9

## 2017-09-27 LAB — CBC WITH DIFFERENTIAL/PLATELET
BASOS PCT: 0 %
Basophils Absolute: 0 10*3/uL (ref 0.0–0.1)
Eosinophils Absolute: 0.3 10*3/uL (ref 0.0–0.7)
Eosinophils Relative: 2 %
HEMATOCRIT: 38.2 % — AB (ref 39.0–52.0)
HEMOGLOBIN: 12.4 g/dL — AB (ref 13.0–17.0)
Lymphocytes Relative: 25 %
Lymphs Abs: 2.6 10*3/uL (ref 0.7–4.0)
MCH: 30.5 pg (ref 26.0–34.0)
MCHC: 32.5 g/dL (ref 30.0–36.0)
MCV: 94.1 fL (ref 78.0–100.0)
Monocytes Absolute: 0.7 10*3/uL (ref 0.1–1.0)
Monocytes Relative: 6 %
NEUTROS ABS: 6.7 10*3/uL (ref 1.7–7.7)
NEUTROS PCT: 67 %
Platelets: 312 10*3/uL (ref 150–400)
RBC: 4.06 MIL/uL — ABNORMAL LOW (ref 4.22–5.81)
RDW: 14.4 % (ref 11.5–15.5)
WBC: 10.2 10*3/uL (ref 4.0–10.5)

## 2017-09-27 LAB — CBG MONITORING, ED: GLUCOSE-CAPILLARY: 161 mg/dL — AB (ref 65–99)

## 2017-09-27 LAB — COMPREHENSIVE METABOLIC PANEL
ALT: 15 U/L — ABNORMAL LOW (ref 17–63)
ANION GAP: 12 (ref 5–15)
AST: 24 U/L (ref 15–41)
Albumin: 3.2 g/dL — ABNORMAL LOW (ref 3.5–5.0)
Alkaline Phosphatase: 71 U/L (ref 38–126)
BILIRUBIN TOTAL: 0.6 mg/dL (ref 0.3–1.2)
BUN: 20 mg/dL (ref 6–20)
CALCIUM: 8.8 mg/dL — AB (ref 8.9–10.3)
CHLORIDE: 100 mmol/L — AB (ref 101–111)
CO2: 26 mmol/L (ref 22–32)
Creatinine, Ser: 1.4 mg/dL — ABNORMAL HIGH (ref 0.61–1.24)
GFR calc Af Amer: 53 mL/min — ABNORMAL LOW (ref >60)
GFR calc non Af Amer: 46 mL/min — ABNORMAL LOW (ref >60)
Glucose, Bld: 263 mg/dL — ABNORMAL HIGH (ref 65–99)
POTASSIUM: 3.5 mmol/L (ref 3.5–5.1)
Sodium: 138 mmol/L (ref 135–145)
Total Protein: 7.4 g/dL (ref 6.5–8.1)

## 2017-09-27 LAB — I-STAT CHEM 8, ED
BUN: 24 mg/dL — ABNORMAL HIGH (ref 6–20)
CHLORIDE: 100 mmol/L — AB (ref 101–111)
Calcium, Ion: 1.08 mmol/L — ABNORMAL LOW (ref 1.15–1.40)
Creatinine, Ser: 1.3 mg/dL — ABNORMAL HIGH (ref 0.61–1.24)
Glucose, Bld: 266 mg/dL — ABNORMAL HIGH (ref 65–99)
HEMATOCRIT: 40 % (ref 39.0–52.0)
Hemoglobin: 13.6 g/dL (ref 13.0–17.0)
POTASSIUM: 3.6 mmol/L (ref 3.5–5.1)
SODIUM: 141 mmol/L (ref 135–145)
TCO2: 28 mmol/L (ref 22–32)

## 2017-09-27 LAB — MRSA PCR SCREENING: MRSA BY PCR: NEGATIVE

## 2017-09-27 LAB — I-STAT TROPONIN, ED: TROPONIN I, POC: 0.03 ng/mL (ref 0.00–0.08)

## 2017-09-27 LAB — TROPONIN I: Troponin I: 0.06 ng/mL (ref <0.03)

## 2017-09-27 LAB — GLUCOSE, CAPILLARY: GLUCOSE-CAPILLARY: 113 mg/dL — AB (ref 65–99)

## 2017-09-27 LAB — BRAIN NATRIURETIC PEPTIDE: B NATRIURETIC PEPTIDE 5: 192.7 pg/mL — AB (ref 0.0–100.0)

## 2017-09-27 LAB — PROCALCITONIN

## 2017-09-27 MED ORDER — DEXTROSE 5 % IV SOLN: 500.0000 mg | INTRAVENOUS | Status: DC

## 2017-09-27 MED ORDER — FUROSEMIDE 10 MG/ML IJ SOLN
30.0000 mg | Freq: Once | INTRAMUSCULAR | Status: AC
  Administered 2017-09-27: 30 mg via INTRAVENOUS
  Filled 2017-09-27: qty 4

## 2017-09-27 MED ORDER — HYDROCODONE-ACETAMINOPHEN 5-325 MG PO TABS
1.0000 | ORAL_TABLET | Freq: Two times a day (BID) | ORAL | Status: DC | PRN
  Administered 2017-09-27 – 2017-09-28 (×2): 1 via ORAL
  Filled 2017-09-27 (×2): qty 1

## 2017-09-27 MED ORDER — TAMSULOSIN HCL 0.4 MG PO CAPS
0.4000 mg | ORAL_CAPSULE | Freq: Every day | ORAL | Status: DC
  Administered 2017-09-27: 0.4 mg via ORAL
  Filled 2017-09-27: qty 1

## 2017-09-27 MED ORDER — SERTRALINE HCL 50 MG PO TABS
50.0000 mg | ORAL_TABLET | Freq: Every day | ORAL | Status: DC | PRN
  Filled 2017-09-27: qty 1

## 2017-09-27 MED ORDER — ORAL CARE MOUTH RINSE
15.0000 mL | Freq: Two times a day (BID) | OROMUCOSAL | Status: DC
  Administered 2017-09-28: 15 mL via OROMUCOSAL

## 2017-09-27 MED ORDER — CEFEPIME HCL 2 G IJ SOLR
2.0000 g | Freq: Once | INTRAMUSCULAR | Status: AC
  Administered 2017-09-27: 2 g via INTRAVENOUS
  Filled 2017-09-27: qty 2

## 2017-09-27 MED ORDER — VITAMIN B-12 1000 MCG PO TABS
1000.0000 ug | ORAL_TABLET | Freq: Every day | ORAL | Status: DC
  Administered 2017-09-27 – 2017-09-28 (×2): 1000 ug via ORAL
  Filled 2017-09-27 (×2): qty 1

## 2017-09-27 MED ORDER — FUROSEMIDE 10 MG/ML IJ SOLN
60.0000 mg | Freq: Once | INTRAMUSCULAR | Status: AC
  Administered 2017-09-27: 60 mg via INTRAVENOUS
  Filled 2017-09-27: qty 6

## 2017-09-27 MED ORDER — DIPHENHYDRAMINE HCL 25 MG PO CAPS: 25.0000 mg | ORAL_CAPSULE | Freq: Every evening | ORAL | Status: DC | PRN

## 2017-09-27 MED ORDER — ALLOPURINOL 100 MG PO TABS
200.0000 mg | ORAL_TABLET | Freq: Every day | ORAL | Status: DC
  Administered 2017-09-27 – 2017-09-28 (×2): 200 mg via ORAL
  Filled 2017-09-27 (×2): qty 2

## 2017-09-27 MED ORDER — CARVEDILOL 6.25 MG PO TABS
6.2500 mg | ORAL_TABLET | Freq: Two times a day (BID) | ORAL | Status: DC
  Administered 2017-09-27 – 2017-09-28 (×2): 6.25 mg via ORAL
  Filled 2017-09-27 (×2): qty 1

## 2017-09-27 MED ORDER — ATORVASTATIN CALCIUM 40 MG PO TABS
40.0000 mg | ORAL_TABLET | Freq: Every day | ORAL | Status: DC
  Administered 2017-09-27: 40 mg via ORAL
  Filled 2017-09-27: qty 1

## 2017-09-27 MED ORDER — INSULIN ASPART 100 UNIT/ML ~~LOC~~ SOLN
0.0000 [IU] | Freq: Three times a day (TID) | SUBCUTANEOUS | Status: DC
  Administered 2017-09-27 – 2017-09-28 (×2): 2 [IU] via SUBCUTANEOUS
  Administered 2017-09-28: 1 [IU] via SUBCUTANEOUS
  Filled 2017-09-27: qty 1

## 2017-09-27 MED ORDER — NITROGLYCERIN 2 % TD OINT: 0.5000 [in_us] | TOPICAL_OINTMENT | Freq: Once | TRANSDERMAL | Status: DC

## 2017-09-27 MED ORDER — FINASTERIDE 5 MG PO TABS
5.0000 mg | ORAL_TABLET | Freq: Every day | ORAL | Status: DC
  Administered 2017-09-27 – 2017-09-28 (×2): 5 mg via ORAL
  Filled 2017-09-27 (×2): qty 1

## 2017-09-27 MED ORDER — HYDROCODONE-ACETAMINOPHEN 5-325 MG PO TABS
1.0000 | ORAL_TABLET | Freq: Four times a day (QID) | ORAL | Status: DC | PRN
  Administered 2017-09-27: 1 via ORAL
  Filled 2017-09-27 (×2): qty 1

## 2017-09-27 MED ORDER — APIXABAN 5 MG PO TABS
5.0000 mg | ORAL_TABLET | Freq: Two times a day (BID) | ORAL | Status: DC
  Administered 2017-09-27 – 2017-09-28 (×2): 5 mg via ORAL
  Filled 2017-09-27 (×3): qty 1

## 2017-09-27 MED ORDER — INFLUENZA VAC SPLIT HIGH-DOSE 0.5 ML IM SUSY
0.5000 mL | PREFILLED_SYRINGE | INTRAMUSCULAR | Status: AC | PRN
  Administered 2017-09-28: 0.5 mL via INTRAMUSCULAR
  Filled 2017-09-27: qty 0.5

## 2017-09-27 MED ORDER — DEXTROSE 5 % IV SOLN: 1.0000 g | INTRAVENOUS | Status: DC

## 2017-09-27 MED ORDER — HYDRALAZINE HCL 50 MG PO TABS
25.0000 mg | ORAL_TABLET | Freq: Three times a day (TID) | ORAL | Status: DC
  Administered 2017-09-27 – 2017-09-28 (×3): 25 mg via ORAL
  Filled 2017-09-27 (×3): qty 1

## 2017-09-27 MED ORDER — ISOSORBIDE DINITRATE ER 40 MG PO CPCR
40.0000 mg | ORAL_CAPSULE | Freq: Three times a day (TID) | ORAL | Status: DC
  Administered 2017-09-27 – 2017-09-28 (×3): 40 mg via ORAL
  Filled 2017-09-27 (×6): qty 1

## 2017-09-27 MED ORDER — DICLOFENAC SODIUM 1 % TD GEL
4.0000 g | Freq: Four times a day (QID) | TRANSDERMAL | Status: DC
  Administered 2017-09-27 – 2017-09-28 (×3): 4 g via TOPICAL
  Filled 2017-09-27: qty 100

## 2017-09-27 MED ORDER — ALBUTEROL SULFATE (2.5 MG/3ML) 0.083% IN NEBU
5.0000 mg | INHALATION_SOLUTION | Freq: Once | RESPIRATORY_TRACT | Status: AC
  Administered 2017-09-27: 5 mg via RESPIRATORY_TRACT
  Filled 2017-09-27: qty 6

## 2017-09-27 MED ORDER — VANCOMYCIN HCL 10 G IV SOLR: 1250.0000 mg | INTRAVENOUS | Status: DC

## 2017-09-27 MED ORDER — VANCOMYCIN HCL 10 G IV SOLR
2000.0000 mg | Freq: Once | INTRAVENOUS | Status: AC
  Administered 2017-09-27: 2000 mg via INTRAVENOUS
  Filled 2017-09-27: qty 2000

## 2017-09-27 NOTE — ED Notes (Signed)
Admitting MD at bedside.

## 2017-09-27 NOTE — ED Notes (Signed)
Pt received a meal trey and is eating

## 2017-09-27 NOTE — ED Notes (Signed)
Prior to my arrival the nurse before this nurse had not gotten medication verified by pharmacy and had not gotten any of the medication released. Called pharmacy to release medication.

## 2017-09-27 NOTE — H&P (Signed)
Date: 09/27/2017               Patient Name:  Ryan Macdonald MRN: [id number]  DOB: [birth year] Age / Sex: 83 y.o., male   [facility]P: [facility]         Medical Service: Internal Medicine Teaching Service         Attending Physician: Dr. Oswaldo Done, Marquita Palms, *    First Contact: Dr. Caron Presume Pager: [phone]  Second Contact: Dr. Mikey Bussing Pager: [phone]       After Hours (After 5p/  First Contact Pager: [phone]  weekends / holidays): Second Contact Pager: [phone]   Chief Complaint: SOB  History of Present Illness: Patient is an 83 year old male with a past medical history of hypertension, hyperlipidemia, type 2 diabetes, nonischemic cardiomyopathy (EF 15-20% status post Biotronik ICD), paroxysmal atrial fibrillation, CKD 3 presenting to the hospital with a chief complaint of shortness of breath. States he experienced acute onset shortness of breath at 5 AM this morning he was already awake at that time. Denies having any cough weight change, orthopnea, paroxysmal nocturnal dyspnea, or lower extremity edema. States he is compliant with his home Lasix and watches his fluid and salt intake. Denies having any chest pain.denies having any fevers or chills. Denies any recent travel or calf pain/swelling. He reports feeling well otherwise and has no other complaints. He is a patient at the [facility].  Per EMS, initial SPO2 92% on room air, improved to 96% on 2 L oxygen via nasal cannula. Patient received 5 mg albuterol and reported increased work of breathing plus noted to have a drop in his SPO2. Upon arrival, patient was tachycardic, tachypneic, and SPO2 79% on 8 L oxygen. He was initially placed on a nonrebreather in the ED and then transition to BiPAP. He received 30 mg IV Lasix, vancomycin, and cefepime in the ED.   Meds:  Current Meds  Medication Sig  . allopurinol (ZYLOPRIM) 100 MG tablet Take 200 mg by mouth daily.  Marland Kitchen apixaban (ELIQUIS) 5 MG TABS tablet Take 1 tablet (5 mg  total) by mouth 2 (two) times daily.  . carvedilol (COREG) 12.5 MG tablet Take 0.5 tablets (6.25 mg total) by mouth 2 (two) times daily with a meal.  . cyanocobalamin 1000 MCG tablet Take 1,000 mcg by mouth daily.   . diclofenac sodium (VOLTAREN) 1 % GEL Apply 4 g topically 4 (four) times daily.  . diphenhydrAMINE (BENADRYL) 25 mg capsule Take 25 mg by mouth at bedtime as needed for sleep.  . finasteride (PROSCAR) 5 MG tablet Take 5 mg by mouth daily.  . hydrALAZINE (APRESOLINE) 25 MG tablet Take 25 mg by mouth 3 (three) times daily.  Marland Kitchen HYDROcodone-acetaminophen (NORCO/VICODIN) 5-325 MG tablet Take 2 tablets by mouth every 4 (four) hours as needed. (Patient taking differently: Take 1 tablet by mouth 2 (two) times daily. )  . isosorbide dinitrate (DILATRATE-SR) 40 MG CR capsule Take 40 mg by mouth 3 (three) times daily.  Marland Kitchen ketoconazole (NIZORAL) 2 % shampoo Apply 1 application topically 2 (two) times a week.  Marland Kitchen MAGNESIUM PO Take 0.5 tablets by mouth 2 (two) times daily.  . sertraline (ZOLOFT) 25 MG tablet Take 50 mg by mouth daily as needed (for anxiety). Takes 1-2 times per week  . simvastatin (ZOCOR) 80 MG tablet Take 40 mg by mouth daily.  Marland Kitchen spironolactone (ALDACTONE) 25 MG tablet Take 25 mg by mouth daily.  . tamsulosin (FLOMAX) 0.4 MG CAPS capsule Take  0.4 mg by mouth at bedtime.     Allergies: Allergies as of 09/27/2017 - Review Complete 09/27/2017  Allergen Reaction Noted  . Lisinopril Swelling 10/11/2013   Past Medical History:  Diagnosis Date  . Cat scratch fever    removed mass on right side neck  . Cataracts, bilateral   . CHF (congestive heart failure) (HCC)   . Diabetes mellitus without complication (HCC)    TYPE 2  . High cholesterol   . Hypertension   . Kidney stones   . Morbid obesity (HCC)   . Nonischemic cardiomyopathy (HCC)    a. ? 2009 Cath in [place] - nl cors per pt;  b. 09/2013 Echo: EF 25-30%, sev diff HK.  . Osteoarthritis   . PAF (paroxysmal atrial  fibrillation) (HCC)    a. post-op hip in 2014 - prev on xarelto.  . Renal insufficiency     Family History: Mother had cerebral hemorrhage; now deceased. Sister has lupus. Another sister has diabetes.  Social History: Former smoker; quit 50 years ago. Drinks beer on occasion; once every 1-2 months. No current illicit drug use; used marijuana in the 1950s.  Review of Systems: A complete ROS was negative except as per HPI.  Physical Exam: Blood pressure (!) 151/96, pulse 89, resp. rate 19, height 5\' 7"  (1.702 m), weight 251 lb (113.9 kg), SpO2 91 %. Physical Exam  Constitutional: He is oriented to person, place, and time. He appears well-developed and well-nourished. No distress.  HENT:  Head: Normocephalic and atraumatic.  Mouth/Throat: Oropharynx is clear and moist.  Eyes: Right eye exhibits no discharge. Left eye exhibits no discharge.  Neck: No JVD present.  Cardiovascular: Normal rate and intact distal pulses.  Exam reveals no gallop and no friction rub.   Irregularly irregular rhythm  Pulmonary/Chest: Effort normal and breath sounds normal. He has no wheezes.  On supplemental oxygen via nasal cannula Rales appreciated at the left lung base.  Abdominal: Soft. Bowel sounds are normal. He exhibits no distension. There is no tenderness.  Musculoskeletal:  +1 pretibial edema  Neurological: He is alert and oriented to person, place, and time.  Skin: Skin is warm and dry.    EKG: personally reviewed my interpretation is atrial fibrillation (heart rate 99), PVCs, and nonspecific T wave changes.  CXR: personally reviewed my interpretation is pulmonary vascular congestion and questionable left upper lobe consolidation.  Assessment & Plan by Problem: Active Problems:   Acute respiratory failure with hypoxia [facility](HCC)  83 year old male with a past medical history of hypertension, hyperlipidemia, type 2 diabetes, nonischemic cardiomyopathy (EF 15-20% status post Biotronik ICD), paroxysmal  atrial fibrillation, CKD 3 presenting to the hospital with a chief complaint of shortness of breath.  Acute hypoxic respiratory failure: Likely a combination of acute decompensated heart failure and community-acquired pneumonia. He has a history of nonischemic cardiomyopathy with EF 15-20%. Although he does not endorse any symptoms of heart failure and BNP 192.7 (at baseline), chest x-ray showing pulmonary vascular congestion and he had rales on exam. Chest x-also showing a questionable left upper lobe consolidation. However, patient is afebrile and does not have leukocytosis. He received a dose of IV Lasix 30 mg along with Vanc and cefepime in the ED. Currently off of BiPAP and satting in the low 90s on 7L O2 via [place].  No concern for ACS at this time. EKG with nonspecific T-wave changes, however, patient has no CP and troponins flat (0.06>0.03). Wells score low risk - 1.5 based on tachycardia only.  -  Admit to stepdown -Supplemental oxygen as needed -BiPAP as needed -Give another dose of IV Lasix 30 mg once now, followed by IV Lasix 60 mg once at 5 PM -Discontinue Vanco and cefepime -Start ceftriaxone and azithromycin for coverage of possible CAP -Consider CTA to r/o PE if no improvement with IV diuresis and antibiotics. D-dimer may be falsely elevated in the setting of chronic kidney disease. -Daily weights -Intake and output  -BMP in am  -Repeat EKG in am   Nonischemic cardiomyopathy: Status post Biotronik AICD in [place] in December 2016. Echo done in March 2018 showing left ventricular ejection fraction 15-20% and grade 1 diastolic dysfunction. Per cardiology documentation, patient had cardiac catheterization in 2009 in [place] which showed clean coronaries. He is not on an ACE inhibitor, ARB, or Entresto due to history of lisinopril-induced angioedema. Chest x-ray showing pulmonary vascular congestion. -Continue IV diuresis as mentioned above -continue home Coreg 6.25 mg twice daily -Hold  home spironolactone 25 mg by mouth daily -Daily weights -Intake and output   Paroxysmal A. Fib: CHA2DS2VASc 5. Currently in A. Fib but rate controlled.  -Continue home Coreg 6.25 mg twice daily -Continue home Eliquis 5 mg twice daily  Hypertension  -continue home Coreg 6.25 mg twice daily -continue home hydralazine 25 mg 3 times a day -continue home isosorbide dinitrate 40 mg 3 times a day -Hold home spironolactone 25 mg by mouth daily  CKD 3: Creatinine 1.3, at baseline. BUN 24. GFR 53.   -BMP in a.m. to assess renal function in the setting of IV diuresis  Chronic normocytic anemia: likely in the setting of CKD. Hemoglobin stable at 12.4. -No acute intervention needed at this time  Hyperlipidemia -Home Zocor not on formulary. Give Lipitor 40 mg daily.   Gout: No acute flare.  -Continue home allopurinol 200 mg daily  BPH -Continue home finasteride 5 mg daily -continue home tamsulosin 0.4 mg at bedtime  Type 2 diabetes: Hgb A1c 6.3 in 01/2017. CBG 263.  -Hold home glipizide -Sensitive sliding scale -A1c pending   Osteoarthritis -Continue home hydrocodone-acetaminophen 5-325 mg 1 tablets twice daily as needed -Continue home Voltaren gel 4 times a day  Anxiety -Continue home sertraline  Diet: NPO/ sips with meds DVT ppx: Eliquis  Code: Full (confirmed with patient)  Dispo: Admit patient to Inpatient with expected length of stay greater than 2 midnights.  Signed: John Giovanni, MD 09/27/2017, 11:10 AM  Pager: [phone]

## 2017-09-27 NOTE — Progress Notes (Signed)
Pharmacy Antibiotic Note  Ryan Macdonald is a 83 y.o. male admitted on 09/27/2017 with pneumonia.  Pharmacy has been consulted for vancomycin dosing. Temperature not yet documented. WBC is WNL and SCr is 1.4.   Plan: Vanc 2gm IV x 1 then 1250mg  IV Q24H F/u renal fxn, C&S, clinical status and trough at [facility] F/u continuation of cefepime or other gram neg coverage     No data recorded.   Recent Labs Lab 09/27/17 0756 09/27/17 0812  WBC 10.2  --   CREATININE  --  1.30*    CrCl cannot be calculated (Unknown ideal weight.).    Allergies  Allergen Reactions  . Lisinopril Swelling    Facial swelling    Antimicrobials this admission: Vanc 10/30>> Cefepime x 1 10/30  Dose adjustments this admission: N/A  Microbiology results: Pending  Thank you for allowing pharmacy to be a part of this patient's care.  Ryan Macdonald, Drake Leach 09/27/2017 8:47 AM

## 2017-09-27 NOTE — ED Triage Notes (Signed)
Pt arrives from hom via [facility] c/o SOB, denies CP.  EMS reports initial O2 sats 92%$, improved to 96% on 2L O2 [place]. EMS reports giving 5 mg albuterol, reports pt WOB increased, O2 sats dropped.  On arrival pt labored resp, audible rhonchi, initial sats on 8L O2 79%.  Dr. Jeraldine Loots at bedside. Pt placed on NRB, RT called.

## 2017-09-27 NOTE — ED Provider Notes (Signed)
[name] EMERGENCY DEPARTMENT Provider Note   CSN: [id number] Arrival date & time: 09/27/17  4010     History   Chief Complaint No chief complaint on file.   HPI Ryan Macdonald is a 83 y.o. male.  HPI  Patient presents in respiratory distress via EMS. The and he notes that he has become short of breath over the past few hours. He has a history of congestive heart failure, states that he has been taking all medication as directed, including Lasix. Patient also has history of atrial fibrillation. Patient denies any pain, cannot identify a clear precipitant, but notes that since onset he has had especially difficulty breathing. EMS notes that after provision of breathing treatment in route he had some improvement, but not resolution. Patient was hypoxic in route. Patient does not wear home oxygen.   Past Medical History:  Diagnosis Date  . Cat scratch fever    removed mass on right side neck  . Cataracts, bilateral   . CHF (congestive heart failure) (HCC)   . Diabetes mellitus without complication (HCC)    TYPE 2  . High cholesterol   . Hypertension   . Kidney stones   . Morbid obesity (HCC)   . Nonischemic cardiomyopathy (HCC)    a. ? 2009 Cath in [place] - nl cors per pt;  b. 09/2013 Echo: EF 25-30%, sev diff HK.  . Osteoarthritis   . PAF (paroxysmal atrial fibrillation) (HCC)    a. post-op hip in 2014 - prev on xarelto.  . Renal insufficiency     Patient Active Problem List   Diagnosis Date Noted  . Renal insufficiency   . Acute on chronic systolic CHF (congestive heart failure) (HCC) 07/08/2017  . Left knee pain 07/08/2017  . Acute bronchitis 05/10/2017  . Acute congestive heart failure (HCC) 04/14/2017  . Elevated troponin 04/14/2017  . Lactic acidosis   . Acute on chronic systolic heart failure, NYHA class 2 (HCC) 02/03/2017  . Diabetes mellitus type 2 in obese (HCC) 02/03/2017  . CAP (community acquired pneumonia) 02/03/2017  . Lobar  pneumonia (HCC)   . Diabetes mellitus with complication (HCC)   . Acute on chronic respiratory failure with hypoxia (HCC)   . Acute on chronic combined systolic (congestive) and diastolic (congestive) heart failure (HCC) 05/19/2016  . Depression 05/19/2016  . Gout 05/19/2016  . 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 NSVT (nonsustained ventricular tachycardia) (HCC)   . Chronic renal insufficiency, stage III (moderate) (HCC)   . Pulmonary embolism (HCC)   . Acute respiratory failure with hypoxia (HCC)   . Osteoarthritis of left hip 10/25/2013  . Atrial fibrillation (HCC) 10/25/2013  . S/P hip replacement 10/24/2013  . Nonischemic cardiomyopathy (HCC)   . Obesity (BMI 30-39.9)   . Essential hypertension   . Hyperlipidemia     Past Surgical History:  Procedure Laterality Date  . CARDIAC CATHETERIZATION  1980's  . CIRCUMCISION  1940's  . CYSTOSCOPY WITH URETHRAL DILATATION  10/23/2013  . CYSTOSCOPY WITH URETHRAL DILATATION N/A 10/23/2013   Procedure: CYSTOSCOPY WITH URETHRAL DILATATION;  Surgeon: Kathi Ludwig, MD;  Location[facility] OR;  Service: Urology;  Laterality: N/A;  . INCISION AND DRAINAGE OF WOUND Right 1980's   "cat scratch" (10/23/2013)  . INGUINAL HERNIA REPAIR Right 1950's  . KIDNEY STONE SURGERY Right 1970's; 1983   "twice"  . TONSILLECTOMY AND ADENOIDECTOMY  1940's  . TOTAL HIP ARTHROPLASTY Left 10/23/2013  . TOTAL HIP ARTHROPLASTY Left 10/23/2013  Procedure: TOTAL HIP ARTHROPLASTY;  Surgeon: Valeria BatmanPeter W Whitfield, MD;  Location[facility] OR;  Service: Orthopedics;  Laterality: Left;       Home Medications    Prior to Admission medications   Medication Sig Start Date End Date Taking? Authorizing Provider  allopurinol (ZYLOPRIM) 100 MG tablet Take 200 mg by mouth daily.    [provider]  apixaban (ELIQUIS) 5 MG TABS tablet Take 1 tablet (5 mg total) by mouth 2 (two) times daily. 02/06/17   Amin, Loura HaltAnkit Chirag, MD  carvedilol  (COREG) 12.5 MG tablet Take 0.5 tablets (6.25 mg total) by mouth 2 (two) times daily with a meal. 04/15/17   Osvaldo ShipperKrishnan, Gokul, MD  cephALEXin (KEFLEX) 500 MG capsule 2 caps po bid x 7 days Patient not taking: Reported on 08/25/2017 07/28/17   Arthor CaptainHarris, Abigail, PA-C  cyanocobalamin 1000 MCG tablet Take 1,000 mcg by mouth daily.     [provider]  diclofenac sodium (VOLTAREN) 1 % GEL Apply 4 g topically 4 (four) times daily. 07/10/17   Valentino NoseBoswell, Nathan, MD  diphenhydrAMINE (BENADRYL) 25 mg capsule Take 25 mg by mouth at bedtime as needed for sleep.    [provider]  finasteride (PROSCAR) 5 MG tablet Take 5 mg by mouth daily.    [provider]  furosemide (LASIX) 20 MG tablet Take 3 tablets (60 mg total) by mouth 2 (two) times daily. 07/10/17   Valentino NoseBoswell, Nathan, MD  glipiZIDE (GLUCOTROL) 5 MG tablet Take 5 mg by mouth daily before breakfast.    [provider]  hydrALAZINE (APRESOLINE) 25 MG tablet Take 25 mg by mouth 3 (three) times daily.    [provider]  HYDROcodone-acetaminophen (NORCO/VICODIN) 5-325 MG tablet Take 2 tablets by mouth every 4 (four) hours as needed. Patient taking differently: Take 1 tablet by mouth 2 (two) times daily.  04/09/16   Isa RankinSmith, Ann B, MD  ipratropium-albuterol (DUONEB) 0.5-2.5 (3) MG/3ML SOLN Take 3 mLs by nebulization every 4 (four) hours as needed. 05/14/17   Pearson GrippeKim, James, MD  isosorbide dinitrate (DILATRATE-SR) 40 MG CR capsule Take 40 mg by mouth 3 (three) times daily.    [provider]  ketoconazole (NIZORAL) 2 % shampoo Apply 1 application topically 2 (two) times a week.    [provider]  MAGNESIUM PO Take 0.5 tablets by mouth 2 (two) times daily.    [provider]  potassium chloride (K-DUR) 10 MEQ tablet Take 2 tablets (20 mEq total) by mouth daily. 07/10/17 09/17/17  Valentino NoseBoswell, Nathan, MD  sertraline (ZOLOFT) 25 MG tablet Take 50 mg by mouth daily as needed (for anxiety). Takes 1-2 times per week     [provider]  simvastatin (ZOCOR) 80 MG tablet Take 40 mg by mouth daily.    [provider]  spironolactone (ALDACTONE) 25 MG tablet Take 25 mg by mouth daily.    [provider]  tamsulosin (FLOMAX) 0.4 MG CAPS capsule Take 0.4 mg by mouth at bedtime.    [provider]    Family History Family History  Problem Relation Age of Onset  . Heart disease Mother   . Heart Problems Maternal Grandmother   . Other Unknown        negative for premature CAD    Social History Social History  Substance Use Topics  . Smoking status: Former Smoker    Packs/day: 1.00    Years: 15.00    Types: Cigarettes, Cigars  . Smokeless tobacco: Former NeurosurgeonUser  Comment: quit smoking ~ 50 yr ago  . Alcohol use No     Comment: rare beer.     Allergies   Lisinopril   Review of Systems Review of Systems  Constitutional:       Per HPI, otherwise negative  HENT:       Per HPI, otherwise negative  Respiratory:       Per HPI, otherwise negative  Cardiovascular:       Per HPI, otherwise negative  Gastrointestinal: Negative for vomiting.  Endocrine:       Negative aside from HPI  Genitourinary:       Neg aside from HPI   Musculoskeletal:       Per HPI, otherwise negative  Skin: Negative.   Neurological: Negative for syncope.     Physical Exam Updated Vital Signs BP (!) 154/91   Pulse (!) 106   Resp (!) 31   SpO2 97%   Physical Exam  Constitutional: He is oriented to person, place, and time. He appears well-developed. He appears distressed.  HENT:  Head: Normocephalic and atraumatic.  Eyes: Conjunctivae and EOM are normal.  Cardiovascular: An irregularly irregular rhythm present. Tachycardia present.   Pulmonary/Chest: No stridor. He is in respiratory distress. He has wheezes.  Abdominal: He exhibits no distension.  Musculoskeletal: He exhibits no edema.  Neurological: He is alert and oriented to person, place, and time.  Skin: Skin is warm.  He is diaphoretic.  Psychiatric: He has a normal mood and affect.  Nursing note and vitals reviewed.  Patient is hypoxic, 80% on nasal cannula, 2 L, abnormal   ED Treatments / Results  Labs (all labs ordered are listed, but only abnormal results are displayed) Labs Reviewed  COMPREHENSIVE METABOLIC PANEL - Abnormal; Notable for the following:       Result Value   Chloride 100 (*)    Glucose, Bld 263 (*)    Creatinine, Ser 1.40 (*)    Calcium 8.8 (*)    Albumin 3.2 (*)    ALT 15 (*)    GFR calc non Af Amer 46 (*)    GFR calc Af Amer 53 (*)    All other components within normal limits  CBC WITH DIFFERENTIAL/PLATELET - Abnormal; Notable for the following:    RBC 4.06 (*)    Hemoglobin 12.4 (*)    HCT 38.2 (*)    All other components within normal limits  TROPONIN I - Abnormal; Notable for the following:    Troponin I 0.06 (*)    All other components within normal limits  BRAIN NATRIURETIC PEPTIDE - Abnormal; Notable for the following:    B Natriuretic Peptide 192.7 (*)    All other components within normal limits  I-STAT CHEM 8, ED - Abnormal; Notable for the following:    Chloride 100 (*)    BUN 24 (*)    Creatinine, Ser 1.30 (*)    Glucose, Bld 266 (*)    Calcium, Ion 1.08 (*)    All other components within normal limits  I-STAT TROPONIN, ED    EKG  EKG Interpretation  Date/Time:  Tuesday September 27 2017 07:47:21 EDT Ventricular Rate:  99 PR Interval:    QRS Duration: 103 QT Interval:  338 QTC Calculation: 434 R Axis:   64 Text Interpretation:  Atrial fibrillation Premature ventricular complexes Artifact T wave abnormality Abnormal ekg Confirmed by Gerhard Munch [phone]) on 09/27/2017 7:50:18 AM       Radiology Dg Chest [facility]  Result Date: 09/27/2017 CLINICAL DATA:  Respiratory distress EXAM: PORTABLE CHEST 1 VIEW COMPARISON:  September 17, 2017 FINDINGS: There is airspace consolidation throughout much of the left upper lobe. Right lung is clear.  There is cardiomegaly pulmonary venous hypertension. There is aortic atherosclerosis. Pacemaker lead is attached to the right ventricle. No bone lesions evident. IMPRESSION: Airspace consolidation throughout much of left upper lobe, felt to represent pneumonia. Lungs elsewhere clear. Underlying pulmonary vascular congestion. There is aortic atherosclerosis. Aortic Atherosclerosis (ICD10-I70.0). Followup PA and lateral chest radiographs recommended in 3-4 weeks following trial of antibiotic therapy to ensure resolution and exclude underlying malignancy. Electronically Signed   By: Bretta Bang III M.D.   On: 09/27/2017 08:21    Procedures Procedures (including critical care time)  Medications Ordered in ED Medications  vancomycin (VANCOCIN) 2,000 mg in sodium chloride 0.9 % 500 mL IVPB (not administered)  HYDROcodone-acetaminophen (NORCO/VICODIN) 5-325 MG per tablet 1 tablet (1 tablet Oral Given 09/27/17 1006)  vancomycin (VANCOCIN) 1,250 mg in sodium chloride 0.9 % 250 mL IVPB (not administered)  albuterol (PROVENTIL) (2.5 MG/3ML) 0.083% nebulizer solution 5 mg (5 mg Nebulization Given 09/27/17 0811)  furosemide (LASIX) injection 30 mg (30 mg Intravenous Given 09/27/17 0941)  ceFEPIme (MAXIPIME) 2 g in dextrose 5 % 50 mL IVPB (0 g Intravenous Stopped 09/27/17 1017)     Initial Impression / Assessment and Plan / ED Course  I have reviewed the triage vital signs and the nursing notes.  Pertinent labs & imaging results that were available during my care of the patient were reviewed by me and considered in my medical decision making (see chart for details).    After the initial evaluation with concern for hypoxia, fluid overloaded status the patient was started on noninvasive ventilatory support, BiPAP.  Chart notable for 8 prior ED visits in the past 6 months, typically for respiratory difficulty.  Update: After initiation of BiPAP patient has substantial improvement after only 20  minutes.  8:43 AM Patient ready to transition from BiPAP. Initial findings consistent with pneumonia given left-sided opacification. Patient aware.  Given the patient's hypoxia on arrival, evidence for both congestive heart failure exacerbation and pneumonia, the patient has received antibiotics, IV diuretics, and will require admission for further evaluation and management.  10:38 AM Patient now on nasal cannula, oxygen saturation 90/91%. Final Clinical Impressions(s) / ED Diagnoses  Respiratory distress Healthcare acquired pneumonia  CRITICAL CARE Performed by: Gerhard Munch  ?  Total critical care time: 35 minutes  Critical care time was exclusive of separately billable procedures and treating other patients.  Critical care was necessary to treat or prevent imminent or life-threatening deterioration.  Critical care was time spent personally by me on the following activities: development of treatment plan with patient and/or surrogate as well as nursing, discussions with consultants, evaluation of patient's response to treatment, examination of patient, obtaining history from patient or surrogate, ordering and performing treatments and interventions, ordering and review of laboratory studies, ordering and review of radiographic studies, pulse oximetry and re-evaluation of patient's condition.       Gerhard Munch, MD 09/27/17 [phone]

## 2017-09-28 DIAGNOSIS — I480 Paroxysmal atrial fibrillation: Secondary | ICD-10-CM | POA: Diagnosis not present

## 2017-09-28 DIAGNOSIS — Z888 Allergy status to other drugs, medicaments and biological substances status: Secondary | ICD-10-CM

## 2017-09-28 DIAGNOSIS — Z23 Encounter for immunization: Secondary | ICD-10-CM | POA: Diagnosis not present

## 2017-09-28 DIAGNOSIS — J9601 Acute respiratory failure with hypoxia: Secondary | ICD-10-CM

## 2017-09-28 DIAGNOSIS — E1122 Type 2 diabetes mellitus with diabetic chronic kidney disease: Secondary | ICD-10-CM | POA: Diagnosis not present

## 2017-09-28 DIAGNOSIS — N183 Chronic kidney disease, stage 3 (moderate): Secondary | ICD-10-CM

## 2017-09-28 DIAGNOSIS — Z95810 Presence of automatic (implantable) cardiac defibrillator: Secondary | ICD-10-CM

## 2017-09-28 DIAGNOSIS — I5023 Acute on chronic systolic (congestive) heart failure: Secondary | ICD-10-CM

## 2017-09-28 LAB — BASIC METABOLIC PANEL
Anion gap: 8 (ref 5–15)
BUN: 17 mg/dL (ref 6–20)
CALCIUM: 8.6 mg/dL — AB (ref 8.9–10.3)
CO2: 31 mmol/L (ref 22–32)
CREATININE: 1.32 mg/dL — AB (ref 0.61–1.24)
Chloride: 101 mmol/L (ref 101–111)
GFR calc Af Amer: 57 mL/min — ABNORMAL LOW (ref >60)
GFR, EST NON AFRICAN AMERICAN: 49 mL/min — AB (ref >60)
GLUCOSE: 121 mg/dL — AB (ref 65–99)
Potassium: 3.5 mmol/L (ref 3.5–5.1)
SODIUM: 140 mmol/L (ref 135–145)

## 2017-09-28 LAB — PROCALCITONIN: PROCALCITONIN: 0.15 ng/mL

## 2017-09-28 LAB — GLUCOSE, CAPILLARY
Glucose-Capillary: 126 mg/dL — ABNORMAL HIGH (ref 65–99)
Glucose-Capillary: 181 mg/dL — ABNORMAL HIGH (ref 65–99)

## 2017-09-28 MED ORDER — FUROSEMIDE 40 MG PO TABS
60.0000 mg | ORAL_TABLET | Freq: Two times a day (BID) | ORAL | Status: DC
  Administered 2017-09-28: 60 mg via ORAL
  Filled 2017-09-28: qty 1

## 2017-09-28 NOTE — Care Management CC44 (Signed)
Condition Code 44 Documentation Completed  Patient Details  Name: Ryan Macdonald MRN: [id number] Date of Birth: 05-17-1934   Condition Code 44 given:  Yes Patient signature on Condition Code 44 notice:  Yes Documentation of 2 MD's agreement:  Yes Code 44 added to claim:  Yes    Glennon Mac, RN 09/28/2017, 3:28 PM

## 2017-09-28 NOTE — Progress Notes (Signed)
Patient discharged home with wife- given discharge instructions and belongings, IV removed, and taken to car in wheelchair. Discharge summary will be faxed to [facility] clinic by [facility] following discharge.

## 2017-09-28 NOTE — Discharge Instructions (Signed)

## 2017-09-28 NOTE — Care Management Note (Addendum)
Case Management Note  Patient Details  Name: Kedron Nor MRN: [id number] Date of Birth: 1933/12/18  Subjective/Objective:    Pt admitted with SOB                Action/Plan:   PTA independent from home with wife.  Pt goes to the [place] clinic at the [facility] - PCP Dr Cleon Dew, he gets his prescriptions filled at clinic  Pt states he does not have DME nor HH in the home. Pt is not interested in transferring to [place] facility - form signed and faxed to [place].  [place] clinic informed of admit   Expected Discharge Date:                  Expected Discharge Plan:  Home/Self Care  In-House Referral:     Discharge planning Services  CM Consult  Post Acute Care Choice:    Choice offered to:     DME Arranged:    DME Agency:     HH Arranged:    HH Agency:     Status of Service:  In process, will continue to follow  If discussed at Long Length of Stay Meetings, dates discussed:    Additional Comments:  Cherylann Parr, RN 09/28/2017, 9:56 AM

## 2017-09-28 NOTE — Progress Notes (Signed)
   Subjective: Doing better this AM. Would like [place] to coordinate his care with his doctors at the [place]. Came off BiPAP last night and able to tolerate PO intake. States that this is a recurrent issue for him and two months ago they increased his furosemide from 40 mg BID to 60 mg BID which helped. He reports following a strict fluid and salt restriction, and taking all his medications. Denies any fevers, chills, cough, sputum production, sick contact, chest pain, palpitations. All questions/concerns addressed.   Objective: Vital signs in last 24 hours: Vitals:   09/27/17 1845 09/27/17 2047 09/27/17 2247 09/28/17 0329  BP: (!) 152/84 122/77 128/77 124/81  Pulse: 86     Resp: (!) 25 20 (!) 25 16  Temp:  98.2 F (36.8 C) 98.2 F (36.8 C) 98.5 F (36.9 C)  TempSrc:  Oral Oral Oral  SpO2: 95% 92% 97% 97%  Weight:  261 lb 7.5 oz (118.6 kg)  262 lb 2 oz (118.9 kg)  Height:  5\' 7"  (1.702 m)     General: Obese male resting comfortably Pulm: Currently on 3L/min [place], good air movement, no wheezing or crackles CV: RRR, no murmurs, no rubs Abdomen: Active bowel sounds, soft, no tenderness to palpation  Extremities: Trace pitting edema of the RLE  POCUS: Difficult due to body habitus. Minimal B lines when looking at the lung parenchyma. IVC and cardiac images difficult to visualize.   Assessment/Plan:  1. Acute Hypoxic Respiratory Failure - Acute onset hypoxia that initially required BiPAP. Now off BiPAP - CXR was read as airspace consolidation of the left upper lobe felt to represent pneumonia, and increased pulmonary vascular congestion.  - No signs of ischemia with normal EKG and flat troponins  - Treated with a extra dose of furosemide in the ED. Net output - Started on Ceftriaxone and Azythromycin for possible pneumonia, will stop antibiotic therapy today  - Based on the acute onset of his episodes it is possible the patient is flipping from sinus to atrial fibrillation causing acute  decompensation. He may benefit from rhythm control rather than rate control. Unlikely to be 2/2 an MI with unchanged EKG and negative troponins or flash pulmonary edema although labs are consistent with hyperaldo picture (borderline hypokalemia and metabolic alkalosis) - Patient would like [place] to coordinate his care with the [place] in [place]. Awaiting call back.   2. Nonischemic Cardiomyopathy  - Echocardiogram in March 2018 illustrating EF of 15-20% with diffuse hypokinesis and G1DD, Also valvular abnormalities including sclerotic aortic, calcified mitral, and tricuspid regurgitation.  - S/p Biotronik ICD - Unable to tolerate ACE-I or ARB - Currently diuresising with PO furosemide 60 mg BID  - Continue home Coreg and Isosorbide dinitrate. Hold home spironolactone   3. Atrial Fibrillation  - Rate controlled with Coreg 6.25 mg BID  - Anticoagulation with Eliquis 5 mg QD - May have better symptom control with rhythm vs rate.  4. Hypertension - Continue Coreg, Hydralazine, and Isosorbide dinitrate  - Currently holding spironolactone  5. CKD Stage 3 - Creatinine at baseline of 1.3  - Pertinent labs: K 3.5, CO 2 31, Ca 8.6, Hgb 13.6  6. Type 2 DM, controlled - SSI  Dispo: Anticipated discharge in approximately 0-1 day(s).   Levora Dredge, MD 09/28/2017, 4:54 AM My Pager: [phone]

## 2017-09-28 NOTE — Care Management Obs Status (Signed)
MEDICARE OBSERVATION STATUS NOTIFICATION   Patient Details  Name: Ryan Macdonald MRN: [id number] Date of Birth: 1934/06/25   Medicare Observation Status Notification Given:  Yes    Glennon Mac, RN 09/28/2017, 3:28 PM

## 2017-09-28 NOTE — Discharge Summary (Signed)
Name: Ryan Macdonald MRN: [id number] DOB: [birth year] 83 y.o. PCP: [facility]  Date of Admission: 09/27/2017  7:42 AM Date of Discharge: 09/28/2017 Attending Physician: No att. providers found  Discharge Diagnosis: 1. Acute Hypoxic Respiratory Failure 2. Paroxysmal Atrial Fibrillation   Active Problems:   Atrial fibrillation (HCC)   Acute respiratory failure with hypoxia (HCC)   CHF exacerbation (HCC)  Discharge Medications: Allergies as of 09/28/2017      Reactions   Lisinopril Swelling   Facial swelling      Medication List    TAKE these medications   allopurinol 100 MG tablet Commonly known as:  ZYLOPRIM Take 200 mg by mouth daily.   apixaban 5 MG Tabs tablet Commonly known as:  ELIQUIS Take 1 tablet (5 mg total) by mouth 2 (two) times daily.   carvedilol 12.5 MG tablet Commonly known as:  COREG Take 0.5 tablets (6.25 mg total) by mouth 2 (two) times daily with a meal.   cyanocobalamin 1000 MCG tablet Take 1,000 mcg by mouth daily.   diclofenac sodium 1 % Gel Commonly known as:  VOLTAREN Apply 4 g topically 4 (four) times daily.   diphenhydrAMINE 25 mg capsule Commonly known as:  BENADRYL Take 25 mg by mouth at bedtime as needed for sleep.   finasteride 5 MG tablet Commonly known as:  PROSCAR Take 5 mg by mouth daily.   furosemide 20 MG tablet Commonly known as:  LASIX Take 3 tablets (60 mg total) by mouth 2 (two) times daily.   glipiZIDE 5 MG tablet Commonly known as:  GLUCOTROL Take 5 mg by mouth daily before breakfast.   hydrALAZINE 25 MG tablet Commonly known as:  APRESOLINE Take 25 mg by mouth 3 (three) times daily.   HYDROcodone-acetaminophen 5-325 MG tablet Commonly known as:  NORCO/VICODIN Take 2 tablets by mouth every 4 (four) hours as needed. What changed:  how much to take  when to take this   ipratropium-albuterol 0.5-2.5 (3) MG/3ML Soln Commonly known as:  DUONEB Take 3 mLs by nebulization every 4 (four)  hours as needed.   isosorbide dinitrate 40 MG CR capsule Commonly known as:  DILATRATE-SR Take 40 mg by mouth 3 (three) times daily.   ketoconazole 2 % shampoo Commonly known as:  NIZORAL Apply 1 application topically 2 (two) times a week. Notes to patient:  Two x weekly   MAGNESIUM PO Take 0.5 tablets by mouth 2 (two) times daily.   potassium chloride 10 MEQ tablet Commonly known as:  K-DUR Take 2 tablets (20 mEq total) by mouth daily.   sertraline 25 MG tablet Commonly known as:  ZOLOFT Take 50 mg by mouth daily as needed (for anxiety). Takes 1-2 times per week   simvastatin 80 MG tablet Commonly known as:  ZOCOR Take 40 mg by mouth daily.   spironolactone 25 MG tablet Commonly known as:  ALDACTONE Take 25 mg by mouth daily.   tamsulosin 0.4 MG Caps capsule Commonly known as:  FLOMAX Take 0.4 mg by mouth at bedtime.      Disposition and follow-up:   [name] was discharged from [facility] in Stable condition.  At the hospital follow up visit please address:  1.  PAF. Consider whether he would be an appropriate candidate for rhythm control of his A-fib versus rate control. It appears these recurrent ED visits/hospitalizations are a result of him converting into A-fib.   2.  Labs / imaging needed at time of follow-up: None  3.  Pending labs/ test needing follow-up: None  Hospital Course by problem list: Active Problems:   Atrial fibrillation (HCC)   Acute respiratory failure with hypoxia (HCC)   CHF exacerbation (HCC)   1. Acute Hypoxic Respiratory Failure. [name] is a 83 y.o. Male with a PMHx significant for Non-ischemic cardiomyopathy with a EF of 15-20% s/p Biotronik ICD placement, CKD Stage 3, and paroxysmal atrial fibrillation who presented to the ED with acute hypoxic respiratory failure secondary to conversion into atrial fibrillation. He has had multiple ED visits for similar presentations. Exam and labs were not indicative  of volume overload. Initially he was in A-fib however, converted back into sinus rhythm overnight. We discussed with him getting cardiology involved while here in the hospital but he declined. He would like to follow-up with his cardiologist at the [facility]. We recommend considering rhythm control in this patient who has paroxysmal atrial fibrillation and is frequently brought to the hospital for acute hypoxic respiratory failure when he converts from sinus to atrial fibrillation. No changes were made to his medical management.   Discharge Vitals:   BP 116/77 (BP Location: Right Arm)   Pulse 86   Temp 98.1 F (36.7 C) (Oral)   Resp 16   Ht 5\' 7"  (1.702 m)   Wt 262 lb 2 oz (118.9 kg)   SpO2 98%   BMI 41.05 kg/m   Discharge Instructions: Discharge Instructions    (HEART FAILURE PATIENTS) Call MD:  Anytime you have any of the following symptoms: 1) 3 pound weight gain in 24 hours or 5 pounds in 1 week 2) shortness of breath, with or without a dry hacking cough 3) swelling in the hands, feet or stomach 4) if you have to sleep on extra pillows at night in order to breathe.    Complete by:  As directed    Call MD for:  difficulty breathing, headache or visual disturbances    Complete by:  As directed    Call MD for:  persistant dizziness or light-headedness    Complete by:  As directed    Diet - low sodium heart healthy    Complete by:  As directed    Discharge instructions    Complete by:  As directed    [name],  It was a pleasure to meet you. I am glad you are feeling better. Please continue to take all of your medications as previously prescribed. Please follow up with your primary care doctor and your cardiologist in the next 2-4 weeks. Thank you!   Increase activity slowly    Complete by:  As directed      Signed: Levora DredgeHelberg, Felissa Blouch, MD 09/28/2017, 6:10 PM   Pager: [phone]

## 2017-10-04 ENCOUNTER — Encounter: Payer: Self-pay | Admitting: Podiatry

## 2017-10-04 ENCOUNTER — Ambulatory Visit (INDEPENDENT_AMBULATORY_CARE_PROVIDER_SITE_OTHER): Payer: Non-veteran care | Admitting: Podiatry

## 2017-10-04 DIAGNOSIS — B351 Tinea unguium: Secondary | ICD-10-CM

## 2017-10-04 DIAGNOSIS — M79674 Pain in right toe(s): Secondary | ICD-10-CM

## 2017-10-04 DIAGNOSIS — E119 Type 2 diabetes mellitus without complications: Secondary | ICD-10-CM | POA: Diagnosis not present

## 2017-10-04 DIAGNOSIS — M79675 Pain in left toe(s): Secondary | ICD-10-CM

## 2017-10-04 NOTE — Patient Instructions (Signed)

## 2017-10-04 NOTE — Progress Notes (Signed)
Patient ID: Ryan Macdonald, male   DOB: 05-Oct-1934, 83 y.o.   MRN: [id number]    Subjective This patient presents today for follow-up visit  complaining of uncomfortable toenails when walking and wearing shoes, and a painful corn on the fifth right toe and requests treatment for both these areas.    Objective: Orientated 3 DP and PT pulses 2/4 bilaterally Capillary reflex immediate bilaterally Vibratory sensation intact bilaterally Sensation to 10 g monofilament wire intact 5/5 bilaterally Ankle reflex equal and reactive bilaterally No openskin lesions bilaterally Keratoses lateral fifth right toe The toenails are elongated, brittle, deformed 6-10 HAV left Manual motor testing dorsi flexion, plantar flexion 5/5 bilaterally  Assessment: Diabetic with satisfactory neurovascular status Symptomatic onychomycoses 6-10 Keratoses 1  Plan: Debrided toenails 6-10 mechanically and legs without anybleeding Debrided keratoses 1 (minimal)without anybleeding   Reappoint 3 months

## 2017-11-06 ENCOUNTER — Emergency Department (HOSPITAL_COMMUNITY): Payer: Non-veteran care

## 2017-11-06 ENCOUNTER — Emergency Department (HOSPITAL_COMMUNITY)
Admission: EM | Admit: 2017-11-06 | Discharge: 2017-11-06 | Disposition: A | Discharge status: Home / Self Care | Payer: Non-veteran care | Attending: Emergency Medicine | Admitting: Emergency Medicine

## 2017-11-06 ENCOUNTER — Other Ambulatory Visit: Payer: Self-pay

## 2017-11-06 DIAGNOSIS — Z79899 Other long term (current) drug therapy: Secondary | ICD-10-CM | POA: Insufficient documentation

## 2017-11-06 DIAGNOSIS — Z87891 Personal history of nicotine dependence: Secondary | ICD-10-CM | POA: Insufficient documentation

## 2017-11-06 DIAGNOSIS — Z7984 Long term (current) use of oral hypoglycemic drugs: Secondary | ICD-10-CM | POA: Diagnosis not present

## 2017-11-06 DIAGNOSIS — I110 Hypertensive heart disease with heart failure: Secondary | ICD-10-CM | POA: Diagnosis not present

## 2017-11-06 DIAGNOSIS — E119 Type 2 diabetes mellitus without complications: Secondary | ICD-10-CM | POA: Diagnosis not present

## 2017-11-06 DIAGNOSIS — R0600 Dyspnea, unspecified: Secondary | ICD-10-CM | POA: Insufficient documentation

## 2017-11-06 DIAGNOSIS — Z96642 Presence of left artificial hip joint: Secondary | ICD-10-CM | POA: Diagnosis not present

## 2017-11-06 DIAGNOSIS — Z7901 Long term (current) use of anticoagulants: Secondary | ICD-10-CM | POA: Diagnosis not present

## 2017-11-06 DIAGNOSIS — I5022 Chronic systolic (congestive) heart failure: Secondary | ICD-10-CM | POA: Diagnosis not present

## 2017-11-06 LAB — COMPREHENSIVE METABOLIC PANEL
ALK PHOS: 57 U/L (ref 38–126)
ALT: 17 U/L (ref 17–63)
AST: 27 U/L (ref 15–41)
Albumin: 3.4 g/dL — ABNORMAL LOW (ref 3.5–5.0)
Anion gap: 11 (ref 5–15)
BUN: 24 mg/dL — ABNORMAL HIGH (ref 6–20)
CALCIUM: 8.9 mg/dL (ref 8.9–10.3)
CO2: 26 mmol/L (ref 22–32)
CREATININE: 1.39 mg/dL — AB (ref 0.61–1.24)
Chloride: 103 mmol/L (ref 101–111)
GFR, EST AFRICAN AMERICAN: 53 mL/min — AB (ref >60)
GFR, EST NON AFRICAN AMERICAN: 46 mL/min — AB (ref >60)
Glucose, Bld: 149 mg/dL — ABNORMAL HIGH (ref 65–99)
Potassium: 3.6 mmol/L (ref 3.5–5.1)
SODIUM: 140 mmol/L (ref 135–145)
Total Bilirubin: 0.5 mg/dL (ref 0.3–1.2)
Total Protein: 7.2 g/dL (ref 6.5–8.1)

## 2017-11-06 LAB — CBC WITH DIFFERENTIAL/PLATELET
Basophils Absolute: 0 10*3/uL (ref 0.0–0.1)
Basophils Relative: 0 %
Eosinophils Absolute: 0.1 10*3/uL (ref 0.0–0.7)
Eosinophils Relative: 2 %
HCT: 35.8 % — ABNORMAL LOW (ref 39.0–52.0)
HEMOGLOBIN: 11.7 g/dL — AB (ref 13.0–17.0)
Lymphocytes Relative: 25 %
Lymphs Abs: 2.2 10*3/uL (ref 0.7–4.0)
MCH: 30.3 pg (ref 26.0–34.0)
MCHC: 32.7 g/dL (ref 30.0–36.0)
MCV: 92.7 fL (ref 78.0–100.0)
Monocytes Absolute: 0.4 10*3/uL (ref 0.1–1.0)
Monocytes Relative: 5 %
NEUTROS ABS: 5.8 10*3/uL (ref 1.7–7.7)
NEUTROS PCT: 68 %
Platelets: 258 10*3/uL (ref 150–400)
RBC: 3.86 MIL/uL — AB (ref 4.22–5.81)
RDW: 14.3 % (ref 11.5–15.5)
WBC: 8.5 10*3/uL (ref 4.0–10.5)

## 2017-11-06 LAB — TROPONIN I: Troponin I: 0.06 ng/mL (ref <0.03)

## 2017-11-06 LAB — CBG MONITORING, ED: GLUCOSE-CAPILLARY: 151 mg/dL — AB (ref 65–99)

## 2017-11-06 MED ORDER — DOXYCYCLINE HYCLATE 100 MG PO TABS
100.0000 mg | ORAL_TABLET | Freq: Once | ORAL | Status: AC
  Administered 2017-11-06: 100 mg via ORAL
  Filled 2017-11-06: qty 1

## 2017-11-06 MED ORDER — ALBUTEROL SULFATE (2.5 MG/3ML) 0.083% IN NEBU
2.5000 mg | INHALATION_SOLUTION | Freq: Once | RESPIRATORY_TRACT | Status: AC
  Administered 2017-11-06: 2.5 mg via RESPIRATORY_TRACT
  Filled 2017-11-06: qty 3

## 2017-11-06 MED ORDER — DOXYCYCLINE HYCLATE 100 MG PO CAPS
100.0000 mg | ORAL_CAPSULE | Freq: Two times a day (BID) | ORAL | 0 refills | Status: DC
Start: 2017-11-06 — End: 2018-02-13

## 2017-11-06 MED ORDER — FUROSEMIDE 10 MG/ML IJ SOLN
40.0000 mg | Freq: Once | INTRAMUSCULAR | Status: AC
  Administered 2017-11-06: 40 mg via INTRAVENOUS
  Filled 2017-11-06: qty 4

## 2017-11-06 NOTE — ED Triage Notes (Signed)
Pt reports he used his Nebulizer treatment at home and he felt [facility] after the treatment. Pt reports  he has CHF and takes Lasix daily. Pt reports he took his does this AM . Pt reports his weight this AM to be 260 lbs. Pt does not use O2 at home but was trans ported on 4 liters of O2 by EMS because O2 % was 90 on room air.

## 2017-11-06 NOTE — Discharge Instructions (Signed)
Your x-ray shows the possibility of a pneumonia. Do not have fluid in your lungs today from your congestive heart failure. New prescription for doxycycline, antibiotic, for pneumonia. Continue your other medications including Lasix at your current dosage. Routine follow-up with her primary care physicians. Return to the ER with worsening

## 2017-11-06 NOTE — ED Notes (Signed)
CBG: 151; RN notified.

## 2017-11-06 NOTE — ED Provider Notes (Signed)
[name] EMERGENCY DEPARTMENT Provider Note   CSN: [id number] Arrival date & time: 11/06/17  4098     History   Chief Complaint Chief Complaint  Patient presents with  . Shortness of Breath    HPI Ryan Macdonald is a 83 y.o. male. Chief complaint is shortness of breath.  HPI: 83 year old male. Frequent ER visits with dyspnea. History of CHF with EF of 15-20%. His AICD. Dry weight is 253. He is to 61.8 this morning. He took 3 Lasix tablets. He urinated several 100 mL. His weight was 260. He tried a nebulizer. States he still felt dyspneic. Called 911. Transferred here. Not hypoxemic.  History of flash pulmonary edema with A. fib in the past per no palpitations today. No chest pain no diaphoresis. Edema in his legs per his report is "the same".  Past Medical History:  Diagnosis Date  . Cat scratch fever    removed mass on right side neck  . Cataracts, bilateral   . CHF (congestive heart failure) (HCC)   . Diabetes mellitus without complication (HCC)    TYPE 2  . High cholesterol   . Hypertension   . Kidney stones   . Morbid obesity (HCC)   . Nonischemic cardiomyopathy (HCC)    a. ? 2009 Cath in [place] - nl cors per pt;  b. 09/2013 Echo: EF 25-30%, sev diff HK.  . Osteoarthritis   . PAF (paroxysmal atrial fibrillation) (HCC)    a. post-op hip in 2014 - prev on xarelto.  . Renal insufficiency     Patient Active Problem List   Diagnosis Date Noted  . Acute respiratory failure with hypoxia (HCC) 09/27/2017  . CHF exacerbation (HCC) 09/27/2017  . Renal insufficiency   . Acute on chronic systolic CHF (congestive heart failure) (HCC) 07/08/2017  . Left knee pain 07/08/2017  . Acute bronchitis 05/10/2017  . Acute congestive heart failure (HCC) 04/14/2017  . Elevated troponin 04/14/2017  . Lactic acidosis   . Acute on chronic systolic heart failure, NYHA class 2 (HCC) 02/03/2017  . Diabetes mellitus type 2 in obese (HCC) 02/03/2017  . CAP (community  acquired pneumonia) 02/03/2017  . Lobar pneumonia (HCC)   . Diabetes mellitus with complication (HCC)   . Acute on chronic respiratory failure with hypoxia (HCC)   . Acute on chronic combined systolic (congestive) and diastolic (congestive) heart failure (HCC) 05/19/2016  . Depression 05/19/2016  . Gout 05/19/2016  . 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 NSVT (nonsustained ventricular tachycardia) (HCC)   . Chronic renal insufficiency, stage III (moderate) (HCC)   . Pulmonary embolism (HCC)   . Acute respiratory failure (HCC)   . Osteoarthritis of left hip 10/25/2013  . Atrial fibrillation (HCC) 10/25/2013  . S/P hip replacement 10/24/2013  . Nonischemic cardiomyopathy (HCC)   . Obesity (BMI 30-39.9)   . Essential hypertension   . Hyperlipidemia     Past Surgical History:  Procedure Laterality Date  . CARDIAC CATHETERIZATION  1980's  . CIRCUMCISION  1940's  . CYSTOSCOPY WITH URETHRAL DILATATION  10/23/2013  . CYSTOSCOPY WITH URETHRAL DILATATION N/A 10/23/2013   Procedure: CYSTOSCOPY WITH URETHRAL DILATATION;  Surgeon: Kathi Ludwig, MD;  Location: [facility] OR;  Service: Urology;  Laterality: N/A;  . INCISION AND DRAINAGE OF WOUND Right 1980's   "cat scratch" (10/23/2013)  . INGUINAL HERNIA REPAIR Right 1950's  . KIDNEY STONE SURGERY Right 1970's; 1983   "twice"  . TONSILLECTOMY AND ADENOIDECTOMY  1940's  . TOTAL HIP  ARTHROPLASTY Left 10/23/2013  . TOTAL HIP ARTHROPLASTY Left 10/23/2013   Procedure: TOTAL HIP ARTHROPLASTY;  Surgeon: Valeria Batman, MD;  Location: [facility] OR;  Service: Orthopedics;  Laterality: Left;       Home Medications    Prior to Admission medications   Medication Sig Start Date End Date Taking? Authorizing Provider  allopurinol (ZYLOPRIM) 100 MG tablet Take 200 mg by mouth daily.    [provider]  apixaban (ELIQUIS) 5 MG TABS tablet Take 1 tablet (5 mg total) by mouth 2 (two) times daily. 02/06/17   Amin, Loura Halt, MD  carvedilol (COREG) 12.5 MG tablet Take 0.5 tablets (6.25 mg total) by mouth 2 (two) times daily with a meal. 04/15/17   Osvaldo Shipper, MD  cyanocobalamin 1000 MCG tablet Take 1,000 mcg by mouth daily.     [provider]  diclofenac sodium (VOLTAREN) 1 % GEL Apply 4 g topically 4 (four) times daily. 07/10/17   Valentino Nose, MD  diphenhydrAMINE (BENADRYL) 25 mg capsule Take 25 mg by mouth at bedtime as needed for sleep.    [provider]  doxycycline (VIBRAMYCIN) 100 MG capsule Take 1 capsule (100 mg total) by mouth 2 (two) times daily. 11/06/17   Rolland Porter, MD  finasteride (PROSCAR) 5 MG tablet Take 5 mg by mouth daily.    [provider]  furosemide (LASIX) 20 MG tablet Take 3 tablets (60 mg total) by mouth 2 (two) times daily. 07/10/17   Valentino Nose, MD  glipiZIDE (GLUCOTROL) 5 MG tablet Take 5 mg by mouth daily before breakfast.    [provider]  hydrALAZINE (APRESOLINE) 25 MG tablet Take 25 mg by mouth 3 (three) times daily.    [provider]  HYDROcodone-acetaminophen (NORCO/VICODIN) 5-325 MG tablet Take 2 tablets by mouth every 4 (four) hours as needed. Patient taking differently: Take 1 tablet by mouth 2 (two) times daily.  04/09/16   Isa Rankin, MD  ipratropium-albuterol (DUONEB) 0.5-2.5 (3) MG/3ML SOLN Take 3 mLs by nebulization every 4 (four) hours as needed. 05/14/17   Pearson Grippe, MD  isosorbide dinitrate (DILATRATE-SR) 40 MG CR capsule Take 40 mg by mouth 3 (three) times daily.    [provider]  ketoconazole (NIZORAL) 2 % shampoo Apply 1 application topically 2 (two) times a week.    [provider]  MAGNESIUM PO Take 0.5 tablets by mouth 2 (two) times daily.    [provider]  potassium chloride (K-DUR) 10 MEQ tablet Take 2 tablets (20 mEq total) by mouth daily. 07/10/17 09/17/17  Valentino Nose, MD  sertraline (ZOLOFT) 25 MG tablet Take 50 mg by mouth daily as needed (for anxiety). Takes  1-2 times per week    [provider]  simvastatin (ZOCOR) 80 MG tablet Take 40 mg by mouth daily.    [provider]  spironolactone (ALDACTONE) 25 MG tablet Take 25 mg by mouth daily.    [provider]  tamsulosin (FLOMAX) 0.4 MG CAPS capsule Take 0.4 mg by mouth at bedtime.    [provider]    Family History Family History  Problem Relation Age of Onset  . Heart disease Mother   . Heart Problems Maternal Grandmother   . Other Unknown        negative for premature CAD    Social History Social History   Tobacco Use  . Smoking status: Former Smoker    Packs/day: 1.00    Years: 15.00    Pack  years: 15.00    Types: Cigarettes, Cigars  . Smokeless tobacco: Former Neurosurgeon  . Tobacco comment: quit smoking ~ 50 yr ago  Substance Use Topics  . Alcohol use: No    Alcohol/week: 1.2 oz    Types: 2 Cans of beer per week    Comment: rare beer.  . Drug use: No     Allergies   Lisinopril   Review of Systems Review of Systems  Constitutional: Negative for appetite change, chills, diaphoresis, fatigue and fever.  HENT: Negative for mouth sores, sore throat and trouble swallowing.   Eyes: Negative for visual disturbance.  Respiratory: Positive for shortness of breath. Negative for cough, chest tightness and wheezing.   Cardiovascular: Negative for chest pain.  Gastrointestinal: Negative for abdominal distention, abdominal pain, diarrhea, nausea and vomiting.  Endocrine: Negative for polydipsia, polyphagia and polyuria.  Genitourinary: Negative for dysuria, frequency and hematuria.  Musculoskeletal: Negative for gait problem.  Skin: Negative for color change, pallor and rash.  Neurological: Negative for dizziness, syncope, light-headedness and headaches.  Hematological: Does not bruise/bleed easily.  Psychiatric/Behavioral: Negative for behavioral problems and confusion.     Physical Exam Updated Vital Signs BP (!) 118/104   Pulse 84    Temp 98.1 F (36.7 C)   Resp (!) 23   Ht 5\' 7"  (1.702 m)   Wt 117.9 kg (260 lb)   SpO2 98%   BMI 40.72 kg/m   Physical Exam  Constitutional: He is oriented to person, place, and time. He appears well-developed and well-nourished. No distress.  HENT:  Head: Normocephalic.  Eyes: Conjunctivae are normal. Pupils are equal, round, and reactive to light. No scleral icterus.  Neck: Normal range of motion. Neck supple. No thyromegaly present.  Cardiovascular: Normal rate and regular rhythm. Exam reveals no gallop and no friction rub.  No murmur heard. No gallop. Symmetric 1-2+ bilateral lower extremity edema  Pulmonary/Chest: Effort normal and breath sounds normal. No respiratory distress. He has no wheezes. He has no rales.  Globally diminished breath sounds. No prolongation. No crackles.  Abdominal: Soft. Bowel sounds are normal. He exhibits no distension. There is no tenderness. There is no rebound.  Musculoskeletal: Normal range of motion.  Neurological: He is alert and oriented to person, place, and time.  Skin: Skin is warm and dry. No rash noted.  Psychiatric: He has a normal mood and affect. His behavior is normal.     ED Treatments / Results  Labs (all labs ordered are listed, but only abnormal results are displayed) Labs Reviewed  CBC WITH DIFFERENTIAL/PLATELET - Abnormal; Notable for the following components:      Result Value   RBC 3.86 (*)    Hemoglobin 11.7 (*)    HCT 35.8 (*)    All other components within normal limits  COMPREHENSIVE METABOLIC PANEL - Abnormal; Notable for the following components:   Glucose, Bld 149 (*)    BUN 24 (*)    Creatinine, Ser 1.39 (*)    Albumin 3.4 (*)    GFR calc non Af Amer 46 (*)    GFR calc Af Amer 53 (*)    All other components within normal limits  TROPONIN I - Abnormal; Notable for the following components:   Troponin I 0.06 (*)    All other components within normal limits  CBG MONITORING, ED - Abnormal; Notable for the  following components:   Glucose-Capillary 151 (*)    All other components within normal limits  BRAIN NATRIURETIC PEPTIDE  EKG  EKG Interpretation  Date/Time:  /09/18 1329       Rolland Porter, MD 11/06/17 1331

## 2017-11-16 ENCOUNTER — Emergency Department (HOSPITAL_COMMUNITY): Payer: Non-veteran care

## 2017-11-16 ENCOUNTER — Emergency Department (HOSPITAL_COMMUNITY)
Admission: EM | Admit: 2017-11-16 | Discharge: 2017-11-16 | Disposition: A | Discharge status: Home / Self Care | Payer: Non-veteran care | Attending: Emergency Medicine | Admitting: Emergency Medicine

## 2017-11-16 ENCOUNTER — Encounter (HOSPITAL_COMMUNITY): Payer: Self-pay

## 2017-11-16 DIAGNOSIS — N3001 Acute cystitis with hematuria: Secondary | ICD-10-CM | POA: Diagnosis not present

## 2017-11-16 DIAGNOSIS — Z96642 Presence of left artificial hip joint: Secondary | ICD-10-CM | POA: Diagnosis not present

## 2017-11-16 DIAGNOSIS — Z87891 Personal history of nicotine dependence: Secondary | ICD-10-CM | POA: Diagnosis not present

## 2017-11-16 DIAGNOSIS — E119 Type 2 diabetes mellitus without complications: Secondary | ICD-10-CM | POA: Diagnosis not present

## 2017-11-16 DIAGNOSIS — R0602 Shortness of breath: Secondary | ICD-10-CM | POA: Diagnosis not present

## 2017-11-16 DIAGNOSIS — Z7901 Long term (current) use of anticoagulants: Secondary | ICD-10-CM | POA: Diagnosis not present

## 2017-11-16 DIAGNOSIS — I5042 Chronic combined systolic (congestive) and diastolic (congestive) heart failure: Secondary | ICD-10-CM | POA: Insufficient documentation

## 2017-11-16 DIAGNOSIS — Z79899 Other long term (current) drug therapy: Secondary | ICD-10-CM | POA: Insufficient documentation

## 2017-11-16 DIAGNOSIS — Z7984 Long term (current) use of oral hypoglycemic drugs: Secondary | ICD-10-CM | POA: Insufficient documentation

## 2017-11-16 DIAGNOSIS — I110 Hypertensive heart disease with heart failure: Secondary | ICD-10-CM | POA: Diagnosis not present

## 2017-11-16 LAB — COMPREHENSIVE METABOLIC PANEL
ALK PHOS: 57 U/L (ref 38–126)
ALT: 28 U/L (ref 17–63)
ANION GAP: 10 (ref 5–15)
AST: 34 U/L (ref 15–41)
Albumin: 3.4 g/dL — ABNORMAL LOW (ref 3.5–5.0)
BILIRUBIN TOTAL: 0.8 mg/dL (ref 0.3–1.2)
BUN: 24 mg/dL — ABNORMAL HIGH (ref 6–20)
CALCIUM: 9.1 mg/dL (ref 8.9–10.3)
CO2: 28 mmol/L (ref 22–32)
Chloride: 103 mmol/L (ref 101–111)
Creatinine, Ser: 1.23 mg/dL (ref 0.61–1.24)
GFR calc non Af Amer: 53 mL/min — ABNORMAL LOW (ref >60)
Glucose, Bld: 93 mg/dL (ref 65–99)
Potassium: 3.7 mmol/L (ref 3.5–5.1)
SODIUM: 141 mmol/L (ref 135–145)
TOTAL PROTEIN: 7.3 g/dL (ref 6.5–8.1)

## 2017-11-16 LAB — CBC WITH DIFFERENTIAL/PLATELET
BASOS ABS: 0 10*3/uL (ref 0.0–0.1)
BASOS PCT: 0 %
Eosinophils Absolute: 0.2 10*3/uL (ref 0.0–0.7)
Eosinophils Relative: 2 %
HEMATOCRIT: 34.5 % — AB (ref 39.0–52.0)
HEMOGLOBIN: 11.2 g/dL — AB (ref 13.0–17.0)
Lymphocytes Relative: 19 %
Lymphs Abs: 1.6 10*3/uL (ref 0.7–4.0)
MCH: 30.4 pg (ref 26.0–34.0)
MCHC: 32.5 g/dL (ref 30.0–36.0)
MCV: 93.5 fL (ref 78.0–100.0)
Monocytes Absolute: 0.5 10*3/uL (ref 0.1–1.0)
Monocytes Relative: 5 %
NEUTROS ABS: 6.3 10*3/uL (ref 1.7–7.7)
NEUTROS PCT: 74 %
Platelets: 241 10*3/uL (ref 150–400)
RBC: 3.69 MIL/uL — AB (ref 4.22–5.81)
RDW: 14.5 % (ref 11.5–15.5)
WBC: 8.5 10*3/uL (ref 4.0–10.5)

## 2017-11-16 LAB — URINALYSIS, ROUTINE W REFLEX MICROSCOPIC
Bilirubin Urine: NEGATIVE
Glucose, UA: NEGATIVE mg/dL
Ketones, ur: NEGATIVE mg/dL
NITRITE: POSITIVE — AB
Protein, ur: NEGATIVE mg/dL
SPECIFIC GRAVITY, URINE: 1.01 (ref 1.005–1.030)
pH: 5 (ref 5.0–8.0)

## 2017-11-16 LAB — I-STAT TROPONIN, ED
TROPONIN I, POC: 0.03 ng/mL (ref 0.00–0.08)
TROPONIN I, POC: 0.05 ng/mL (ref 0.00–0.08)

## 2017-11-16 LAB — BRAIN NATRIURETIC PEPTIDE: B Natriuretic Peptide: 345.6 pg/mL — ABNORMAL HIGH (ref 0.0–100.0)

## 2017-11-16 MED ORDER — DEXTROSE 5 % IV SOLN
1.0000 g | Freq: Once | INTRAVENOUS | Status: AC
  Administered 2017-11-16: 1 g via INTRAVENOUS
  Filled 2017-11-16: qty 10

## 2017-11-16 MED ORDER — IOPAMIDOL (ISOVUE-370) INJECTION 76%
INTRAVENOUS | Status: AC
  Administered 2017-11-16: 100 mL
  Filled 2017-11-16: qty 100

## 2017-11-16 MED ORDER — SODIUM CHLORIDE 0.9 % IV SOLN
Freq: Once | INTRAVENOUS | Status: AC
  Administered 2017-11-16: 18:00:00 via INTRAVENOUS

## 2017-11-16 MED ORDER — CEPHALEXIN 250 MG PO CAPS: 500.0000 mg | ORAL_CAPSULE | Freq: Once | ORAL | Status: DC

## 2017-11-16 NOTE — ED Notes (Signed)
ED Provider at bedside. 

## 2017-11-16 NOTE — ED Triage Notes (Signed)
Pt from home with EMS for follow up of penumonia dx. Pt was here about 2 weeks ago and was dx with pneumonia and discharged with prednisone and doxycycline. Pt has not finished his course of abx yet but feels like his pneumonia is not getting any better. Pt a.o, VSS, nad

## 2017-11-16 NOTE — ED Provider Notes (Signed)
[name] EMERGENCY DEPARTMENT Provider Note   CSN: [id number] Arrival date & time: 11/16/17  1605     History   Chief Complaint Chief Complaint  Patient presents with  . Pneumonia    HPI Ryan Macdonald is a 83 y.o. male.  HPI   Ryan Macdonald is a 83 y.o. male, with a history of CHF, DM, PAF obesity, HTN, nonischemic cardiomyopathy, presenting to the ED with shortness of breath and generalized weakness beginning today around 7AM. States he "felt restless" while lying down.  He is afraid his pneumonia has recurred. He took a dose of his PRN lasix (around 9AM this morning) and an albuterol nebulizer (around 7AM this morning). He improved following these medications and his shortness of breath has not recurred.   Was seen 12/9 with cough and SOB, dx with PNA, Rx doxy and prednisone. His cough has resolved.  Denies fever/chills, diaphoresis, dizziness, N/V/D, chest pain, abdominal pain, peripheral edema, cough, or any other complaints.   Past Medical History:  Diagnosis Date  . Cat scratch fever    removed mass on right side neck  . Cataracts, bilateral   . CHF (congestive heart failure) (HCC)   . Diabetes mellitus without complication (HCC)    TYPE 2  . High cholesterol   . Hypertension   . Kidney stones   . Morbid obesity (HCC)   . Nonischemic cardiomyopathy (HCC)    a. ? 2009 Cath in [place] - nl cors per pt;  b. 09/2013 Echo: EF 25-30%, sev diff HK.  . Osteoarthritis   . PAF (paroxysmal atrial fibrillation) (HCC)    a. post-op hip in 2014 - prev on xarelto.  . Renal insufficiency     Patient Active Problem List   Diagnosis Date Noted  . Acute respiratory failure with hypoxia (HCC) 09/27/2017  . CHF exacerbation (HCC) 09/27/2017  . Renal insufficiency   . Acute on chronic systolic CHF (congestive heart failure) (HCC) 07/08/2017  . Left knee pain 07/08/2017  . Acute bronchitis 05/10/2017  . Acute congestive heart failure (HCC) 04/14/2017  .  Elevated troponin 04/14/2017  . Lactic acidosis   . Acute on chronic systolic heart failure, NYHA class 2 (HCC) 02/03/2017  . Diabetes mellitus type 2 in obese (HCC) 02/03/2017  . CAP (community acquired pneumonia) 02/03/2017  . Lobar pneumonia (HCC)   . Diabetes mellitus with complication (HCC)   . Acute on chronic respiratory failure with hypoxia (HCC)   . Acute on chronic combined systolic (congestive) and diastolic (congestive) heart failure (HCC) 05/19/2016  . Depression 05/19/2016  . Gout 05/19/2016  . 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 NSVT (nonsustained ventricular tachycardia) (HCC)   . Chronic renal insufficiency, stage III (moderate) (HCC)   . Pulmonary embolism (HCC)   . Acute respiratory failure (HCC)   . Osteoarthritis of left hip 10/25/2013  . Atrial fibrillation (HCC) 10/25/2013  . S/P hip replacement 10/24/2013  . Nonischemic cardiomyopathy (HCC)   . Obesity (BMI 30-39.9)   . Essential hypertension   . Hyperlipidemia     Past Surgical History:  Procedure Laterality Date  . CARDIAC CATHETERIZATION  1980's  . CIRCUMCISION  1940's  . CYSTOSCOPY WITH URETHRAL DILATATION  10/23/2013  . CYSTOSCOPY WITH URETHRAL DILATATION N/A 10/23/2013   Procedure: CYSTOSCOPY WITH URETHRAL DILATATION;  Surgeon: Kathi Ludwig, MD;  Location: [facility] OR;  Service: Urology;  Laterality: N/A;  . INCISION AND DRAINAGE OF WOUND Right 1980's   "cat scratch" (10/23/2013)  .  INGUINAL HERNIA REPAIR Right 1950's  . KIDNEY STONE SURGERY Right 1970's; 1983   "twice"  . TONSILLECTOMY AND ADENOIDECTOMY  1940's  . TOTAL HIP ARTHROPLASTY Left 10/23/2013  . TOTAL HIP ARTHROPLASTY Left 10/23/2013   Procedure: TOTAL HIP ARTHROPLASTY;  Surgeon: Valeria Batman, MD;  Location: [facility] OR;  Service: Orthopedics;  Laterality: Left;       Home Medications    Prior to Admission medications   Medication Sig Start Date End Date Taking? Authorizing Provider    allopurinol (ZYLOPRIM) 100 MG tablet Take 200 mg by mouth daily.    [provider]  apixaban (ELIQUIS) 5 MG TABS tablet Take 1 tablet (5 mg total) by mouth 2 (two) times daily. 02/06/17   Amin, Loura Halt, MD  carvedilol (COREG) 12.5 MG tablet Take 0.5 tablets (6.25 mg total) by mouth 2 (two) times daily with a meal. 04/15/17   Osvaldo Shipper, MD  cephALEXin (KEFLEX) 500 MG capsule Take 1 capsule (500 mg total) by mouth 2 (two) times daily for 5 days. 11/17/17 11/22/17  Yohana Bartha, Hillard Danker, PA-C  cyanocobalamin 1000 MCG tablet Take 1,000 mcg by mouth daily.     [provider]  diclofenac sodium (VOLTAREN) 1 % GEL Apply 4 g topically 4 (four) times daily. 07/10/17   Valentino Nose, MD  diphenhydrAMINE (BENADRYL) 25 mg capsule Take 25 mg by mouth at bedtime as needed for sleep.    [provider]  doxycycline (VIBRAMYCIN) 100 MG capsule Take 1 capsule (100 mg total) by mouth 2 (two) times daily. 11/06/17   Rolland Porter, MD  finasteride (PROSCAR) 5 MG tablet Take 5 mg by mouth daily.    [provider]  furosemide (LASIX) 20 MG tablet Take 3 tablets (60 mg total) by mouth 2 (two) times daily. 07/10/17   Valentino Nose, MD  glipiZIDE (GLUCOTROL) 5 MG tablet Take 5 mg by mouth daily before breakfast.    [provider]  hydrALAZINE (APRESOLINE) 25 MG tablet Take 25 mg by mouth 3 (three) times daily.    [provider]  HYDROcodone-acetaminophen (NORCO/VICODIN) 5-325 MG tablet Take 2 tablets by mouth every 4 (four) hours as needed. Patient taking differently: Take 1 tablet by mouth 2 (two) times daily.  04/09/16   Isa Rankin, MD  ipratropium-albuterol (DUONEB) 0.5-2.5 (3) MG/3ML SOLN Take 3 mLs by nebulization every 4 (four) hours as needed. 05/14/17   Pearson Grippe, MD  isosorbide dinitrate (DILATRATE-SR) 40 MG CR capsule Take 40 mg by mouth 3 (three) times daily.    [provider]  ketoconazole (NIZORAL) 2 % shampoo Apply 1 application topically 2  (two) times a week.    [provider]  MAGNESIUM PO Take 0.5 tablets by mouth 2 (two) times daily.    [provider]  potassium chloride (K-DUR) 10 MEQ tablet Take 2 tablets (20 mEq total) by mouth daily. 07/10/17 09/17/17  Valentino Nose, MD  sertraline (ZOLOFT) 25 MG tablet Take 50 mg by mouth daily as needed (for anxiety). Takes 1-2 times per week    [provider]  simvastatin (ZOCOR) 80 MG tablet Take 40 mg by mouth daily.    [provider]  spironolactone (ALDACTONE) 25 MG tablet Take 25 mg by mouth daily.    [provider]  tamsulosin (FLOMAX) 0.4 MG CAPS capsule Take 0.4 mg by mouth at bedtime.    [provider]    Family History Family History  Problem Relation Age of Onset  . Heart  disease Mother   . Heart Problems Maternal Grandmother   . Other Unknown        negative for premature CAD    Social History Social History   Tobacco Use  . Smoking status: Former Smoker    Packs/day: 1.00    Years: 15.00    Pack years: 15.00    Types: Cigarettes, Cigars  . Smokeless tobacco: Former NeurosurgeonUser  . Tobacco comment: quit smoking ~ 50 yr ago  Substance Use Topics  . Alcohol use: No    Alcohol/week: 1.2 oz    Types: 2 Cans of beer per week    Comment: rare beer.  . Drug use: No     Allergies   Lisinopril   Review of Systems Review of Systems  Constitutional: Negative for chills, diaphoresis and fever.  Respiratory: Positive for shortness of breath. Negative for cough.   Cardiovascular: Negative for chest pain.  Gastrointestinal: Negative for abdominal pain, diarrhea, nausea and vomiting.  Genitourinary: Negative for flank pain.  Neurological: Positive for weakness (generalized). Negative for dizziness, syncope, light-headedness and headaches.  All other systems reviewed and are negative.    Physical Exam Updated Vital Signs BP 126/78 (BP Location: Right Arm)   Pulse 93   Temp 98.3 F (36.8 C) (Oral)    Resp 18   Ht 5\' 7"  (1.702 m)   Wt 117.9 kg (260 lb)   SpO2 97%   BMI 40.72 kg/m   Physical Exam  Constitutional: He appears well-developed and well-nourished. No distress.  HENT:  Head: Normocephalic and atraumatic.  Mouth/Throat: Mucous membranes are dry.  Eyes: Conjunctivae are normal.  Neck: Neck supple.  Cardiovascular: Normal rate, regular rhythm, normal heart sounds and intact distal pulses.  Pulmonary/Chest: Breath sounds normal.  Speaks in full sentences without difficulty. Initially, showed signs of increased work of breathing with change in position, however, this was noted to have resolved on subsequent evaluations.  Abdominal: Soft. There is no tenderness. There is no guarding.  Musculoskeletal: He exhibits no edema.  Lymphadenopathy:    He has no cervical adenopathy.  Neurological: He is alert.  Skin: Skin is warm and dry. Capillary refill takes less than 2 seconds. He is not diaphoretic.  Psychiatric: He has a normal mood and affect. His behavior is normal.  Nursing note and vitals reviewed.    ED Treatments / Results  Labs (all labs ordered are listed, but only abnormal results are displayed) Labs Reviewed  COMPREHENSIVE METABOLIC PANEL - Abnormal; Notable for the following components:      Result Value   BUN 24 (*)    Albumin 3.4 (*)    GFR calc non Af Amer 53 (*)    All other components within normal limits  CBC WITH DIFFERENTIAL/PLATELET - Abnormal; Notable for the following components:   RBC 3.69 (*)    Hemoglobin 11.2 (*)    HCT 34.5 (*)    All other components within normal limits  BRAIN NATRIURETIC PEPTIDE - Abnormal; Notable for the following components:   B Natriuretic Peptide 345.6 (*)    All other components within normal limits  URINALYSIS, ROUTINE W REFLEX MICROSCOPIC - Abnormal; Notable for the following components:   APPearance CLOUDY (*)    Hgb urine dipstick MODERATE (*)    Nitrite POSITIVE (*)    Leukocytes, UA LARGE (*)    Bacteria,  UA FEW (*)    Squamous Epithelial / LPF 0-5 (*)    All other components within normal limits  URINE  CULTURE  I-STAT TROPONIN, ED  I-STAT TROPONIN, ED    EKG  EKG Interpretation  Date/Time:  Wednesday November 16 2017 17:45:11 EST Ventricular Rate:  78 PR Interval:    QRS Duration: 94 QT Interval:  365 QTC Calculation: 416 R Axis:   54 Text Interpretation:  Atrial fibrillation Low voltage, precordial leads Nonspecific T abnormalities, lateral leads When compared to prior, no significant changes seen.  No STEMI Confirmed by Theda Belfast ([id number]) on 11/16/2017 8:06:42 PM       Radiology Dg Chest 2 View  Result Date: 11/16/2017 CLINICAL DATA:  83 year old male EXAM: CHEST  2 VIEW COMPARISON:  11/06/2017, 09/27/2017 FINDINGS: Cardiomediastinal silhouette unchanged in size and contour. No evidence of central vascular congestion. No interlobular septal thickening. Unchanged position of left chest wall cardiac pacing device/defibrillator. No confluent airspace disease.  No pleural effusion or pneumothorax. IMPRESSION: Similar appearance of the lungs with likely chronic changes and no evidence of superimposed acute cardiopulmonary disease. Unchanged position of cardiac pacing device/ defibrillator. Electronically Signed   By: Gilmer Mor D.O.   On: 11/16/2017 17:12   Ct Angio Chest Pe W And/or Wo Contrast  Result Date: 11/16/2017 CLINICAL DATA:  83 year old male with a history of recent diagnosis pneumonia and shortness of breath. EXAM: CT ANGIOGRAPHY CHEST WITH CONTRAST TECHNIQUE: Multidetector CT imaging of the chest was performed using the standard protocol during bolus administration of intravenous contrast. Multiplanar CT image reconstructions and MIPs were obtained to evaluate the vascular anatomy. CONTRAST:  ISOVUE-370 IOPAMIDOL (ISOVUE-370) INJECTION 76% COMPARISON:  CT chest 11/30/2016, plain film 11/16/2017 FINDINGS: Cardiovascular: Heart: Mild cardiomegaly. Single pacing  lead terminates at the apex of the right ventricle. No pericardial fluid/ thickening. Calcifications of left main, left anterior descending, circumflex, right coronary arteries. Minimal calcifications of the aortic valve Aorta: Unremarkable course, caliber, contour of the thoracic aorta. No aneurysm or dissection flap. No periaortic fluid. Mild calcifications of the aortic arch. Pulmonary arteries: No filling defects of the main pulmonary artery, lobar arteries, segmental, or proximal subsegmental pulmonary arteries. Respiratory motion somewhat limits evaluation of the subsegmental arteries. Mediastinum/Nodes: Unremarkable thoracic inlet. No adenopathy. Unremarkable course of the thoracic esophagus. Lungs/Pleura: No endotracheal or endobronchial debris. No pleural effusions. No interlobular septal thickening or thickening of the fissures. Minimal atelectasis at the dependent lungs. No confluent airspace disease. No significant bronchial wall thickening. No pneumothorax. Upper Abdomen: Diffusely decreased attenuation of liver parenchyma. Incomplete imaging of right-sided nephrolithiasis Musculoskeletal: No displaced fracture. Degenerative changes of the spine. Review of the MIP images confirms the above findings. IMPRESSION: Study is negative for pulmonary emboli. No acute CT finding. Minimal aortic atherosclerosis with left main and 3 vessel coronary artery disease. Aortic Atherosclerosis (ICD10-I70.0). Unchanged single lead cardiac pacing device, with the lead terminating at the apex of the right ventricle. Liver steatosis. Incomplete imaging of right-sided nephrolithiasis, present on the comparison study. Electronically Signed   By: Gilmer Mor D.O.   On: 11/16/2017 19:36    Procedures Procedures (including critical care time)  Medications Ordered in ED Medications  0.9 %  sodium chloride infusion ( Intravenous Stopped 11/16/17 2217)  iopamidol (ISOVUE-370) 76 % injection (100 mLs  Contrast Given  11/16/17 1901)  cefTRIAXone (ROCEPHIN) 1 g in dextrose 5 % 50 mL IVPB (0 g Intravenous Stopped 11/16/17 2217)     Initial Impression / Assessment and Plan / ED Course  I have reviewed the triage vital signs and the nursing notes.  Pertinent labs & imaging results that were available  during my care of the patient were reviewed by me and considered in my medical decision making (see chart for details).  Clinical Course as of Nov 17 118  Wed Nov 16, 2017  1856 Appears to be consistent with previous findings.  BUN: (!) 24 [SJ]  2051 Patient denies shortness of breath or any other symptoms.   [SJ]  2207 Patient looks well, not tachypneic, and symptom-free at discharge. Resp: (!) 31 [SJ]    Clinical Course User Index [SJ] Arasely Akkerman C, PA-C    Patient presents with shortness of breath and generalized weakness this morning. Initial working diagnoses of CHF, persistent pneumonia, PE.  Wells score is 4.5 due to recent immobilization from patient's pneumonia and clinical suspicion. No evidence of PE on CT.  Delta troponins negative.  Other lab results reassuring. No shortness of breath or chest pain during ED course. Patient ambulated without onset of shortness of breath, chest pain, or any other complaints, while maintaining SPO2 of 99% on room air.  PCP and cardiology follow up. The patient was given instructions for home care as well as return precautions. Patient voices understanding of these instructions, accepts the plan, and is comfortable with discharge.    Echo on February 05, 2017: EF 15-20%.  Diastolic dysfunction.  Reduced right ventricular contraction. Chest x-ray on 11/06/17: Slightly increased opacity at the left lung base.  Improvement of patchy airspace consolidation in left upper lobe.  Findings and plan of care discussed with Theda Belfast, MD. Dr. Rush Landmark personally evaluated and examined this patient.  Vitals:   11/16/17 1800 11/16/17 1830 11/16/17 1900 11/16/17 1930  BP:  (!) 142/76 (!) 137/93  135/79  Pulse: 66 74 82 90  Resp: 16 18 16    Temp:      TempSrc:      SpO2: 97% 97% 97% 95%  Weight:      Height:         Final Clinical Impressions(s) / ED Diagnoses   Final diagnoses:  Shortness of breath  Acute cystitis with hematuria    ED Discharge Orders        Ordered    cephALEXin (KEFLEX) 500 MG capsule  2 times daily     11/17/17 0118       Anselm Pancoast, PA-C 11/17/17 0119    Tegeler, Canary Brim, MD 11/17/17 1622

## 2017-11-16 NOTE — ED Notes (Signed)
Patient transported to CT 

## 2017-11-16 NOTE — ED Notes (Signed)
Pt provided with urinal

## 2017-11-16 NOTE — ED Notes (Signed)
Pt. Maintained 99% o2 sats, while ambulating down hallway.  No issues noted.

## 2017-11-16 NOTE — Discharge Instructions (Addendum)
There was some evidence of a urinary tract infection.  The other labs were reassuring.  There is no signs of pneumonia, fluid in the lungs Please take all of your antibiotics until finished!   You may develop abdominal discomfort or diarrhea from the antibiotic.  You may help offset this with probiotics which you can buy or get in yogurt. Do not eat or take the probiotics until 2 hours after your antibiotic.   Follow-up with your primary care provider for repeat assessment on this matter.  Return to the ED for any worsening symptoms.

## 2017-11-17 MED ORDER — CEPHALEXIN 500 MG PO CAPS: 500.0000 mg | ORAL_CAPSULE | Freq: Two times a day (BID) | ORAL | 0 refills | Status: AC

## 2017-12-05 LAB — URINE CULTURE: Culture: >=100000 — AB

## 2017-12-05 LAB — SUSCEPTIBILITY, AER + ANAEROB

## 2017-12-23 LAB — SUSCEPTIBILITY RESULT

## 2017-12-24 ENCOUNTER — Encounter (HOSPITAL_COMMUNITY): Payer: Self-pay | Admitting: Emergency Medicine

## 2017-12-24 ENCOUNTER — Other Ambulatory Visit: Payer: Self-pay

## 2017-12-24 ENCOUNTER — Emergency Department (HOSPITAL_COMMUNITY)
Admission: EM | Admit: 2017-12-24 | Discharge: 2017-12-24 | Disposition: A | Discharge status: Home / Self Care | Payer: Non-veteran care | Attending: Emergency Medicine | Admitting: Emergency Medicine

## 2017-12-24 ENCOUNTER — Emergency Department (HOSPITAL_COMMUNITY): Payer: Non-veteran care

## 2017-12-24 DIAGNOSIS — R531 Weakness: Secondary | ICD-10-CM | POA: Diagnosis present

## 2017-12-24 DIAGNOSIS — E119 Type 2 diabetes mellitus without complications: Secondary | ICD-10-CM | POA: Insufficient documentation

## 2017-12-24 DIAGNOSIS — Z79899 Other long term (current) drug therapy: Secondary | ICD-10-CM | POA: Insufficient documentation

## 2017-12-24 DIAGNOSIS — I499 Cardiac arrhythmia, unspecified: Secondary | ICD-10-CM | POA: Insufficient documentation

## 2017-12-24 DIAGNOSIS — I110 Hypertensive heart disease with heart failure: Secondary | ICD-10-CM | POA: Diagnosis not present

## 2017-12-24 DIAGNOSIS — R0602 Shortness of breath: Secondary | ICD-10-CM | POA: Insufficient documentation

## 2017-12-24 DIAGNOSIS — Z7984 Long term (current) use of oral hypoglycemic drugs: Secondary | ICD-10-CM | POA: Diagnosis not present

## 2017-12-24 DIAGNOSIS — Z8679 Personal history of other diseases of the circulatory system: Secondary | ICD-10-CM | POA: Insufficient documentation

## 2017-12-24 DIAGNOSIS — Z87891 Personal history of nicotine dependence: Secondary | ICD-10-CM | POA: Insufficient documentation

## 2017-12-24 DIAGNOSIS — Z7901 Long term (current) use of anticoagulants: Secondary | ICD-10-CM | POA: Diagnosis not present

## 2017-12-24 DIAGNOSIS — I5022 Chronic systolic (congestive) heart failure: Secondary | ICD-10-CM | POA: Insufficient documentation

## 2017-12-24 LAB — COMPREHENSIVE METABOLIC PANEL
ALT: 18 U/L (ref 17–63)
AST: 31 U/L (ref 15–41)
Albumin: 3.3 g/dL — ABNORMAL LOW (ref 3.5–5.0)
Alkaline Phosphatase: 58 U/L (ref 38–126)
Anion gap: 13 (ref 5–15)
BUN: 19 mg/dL (ref 6–20)
CHLORIDE: 103 mmol/L (ref 101–111)
CO2: 25 mmol/L (ref 22–32)
Calcium: 8.9 mg/dL (ref 8.9–10.3)
Creatinine, Ser: 1.24 mg/dL (ref 0.61–1.24)
GFR, EST NON AFRICAN AMERICAN: 53 mL/min — AB (ref >60)
Glucose, Bld: 115 mg/dL — ABNORMAL HIGH (ref 65–99)
POTASSIUM: 3.8 mmol/L (ref 3.5–5.1)
Sodium: 141 mmol/L (ref 135–145)
Total Bilirubin: 0.5 mg/dL (ref 0.3–1.2)
Total Protein: 7.2 g/dL (ref 6.5–8.1)

## 2017-12-24 LAB — CBC WITH DIFFERENTIAL/PLATELET
Basophils Absolute: 0 10*3/uL (ref 0.0–0.1)
Basophils Relative: 0 %
Eosinophils Absolute: 0.2 10*3/uL (ref 0.0–0.7)
Eosinophils Relative: 3 %
HCT: 36 % — ABNORMAL LOW (ref 39.0–52.0)
HEMOGLOBIN: 11.7 g/dL — AB (ref 13.0–17.0)
LYMPHS ABS: 1.9 10*3/uL (ref 0.7–4.0)
LYMPHS PCT: 25 %
MCH: 30 pg (ref 26.0–34.0)
MCHC: 32.5 g/dL (ref 30.0–36.0)
MCV: 92.3 fL (ref 78.0–100.0)
Monocytes Absolute: 0.6 10*3/uL (ref 0.1–1.0)
Monocytes Relative: 8 %
NEUTROS ABS: 4.9 10*3/uL (ref 1.7–7.7)
NEUTROS PCT: 64 %
Platelets: 242 10*3/uL (ref 150–400)
RBC: 3.9 MIL/uL — AB (ref 4.22–5.81)
RDW: 14.4 % (ref 11.5–15.5)
WBC: 7.6 10*3/uL (ref 4.0–10.5)

## 2017-12-24 LAB — TROPONIN I: Troponin I: 0.05 ng/mL (ref <0.03)

## 2017-12-24 LAB — BRAIN NATRIURETIC PEPTIDE: B Natriuretic Peptide: 266.5 pg/mL — ABNORMAL HIGH (ref 0.0–100.0)

## 2017-12-24 MED ORDER — FUROSEMIDE 10 MG/ML IJ SOLN
60.0000 mg | Freq: Once | INTRAMUSCULAR | Status: AC
  Administered 2017-12-24: 60 mg via INTRAVENOUS
  Filled 2017-12-24: qty 6

## 2017-12-24 NOTE — ED Triage Notes (Signed)
Pt BIB EMS from home for weakness the past couple hours. Pt Sob, denies CP, or fevers. No neuro deficits. Resp e/u.Pt with PVCs pta. EDP at bedside.

## 2017-12-24 NOTE — ED Notes (Signed)
Patient transported to X-ray 

## 2017-12-24 NOTE — ED Provider Notes (Signed)
[name] EMERGENCY DEPARTMENT Provider Note   CSN: [id number] Arrival date & time:        History   Chief Complaint Chief Complaint  Patient presents with  . Weakness    HPI Ryan Macdonald is a 84 y.o. male.  HPI  Patient presents from home with concern of weakness, dyspnea. He has multiple medical issues, but states that he was in his usual state of health until soon after awakening, about 4 hours ago. Since that time he has had persistent generalized weakness, as well as persistent dyspnea. He has had some improvement with albuterol provided by EMS.  No chest pain, no headache, no fever, chills.  He notes that he was recently seen in the cardiology clinic at the [place].  There was an unremarkable evaluation, and he has had no changes in his medication regimen. Acknowledges a history of congestive heart failure, with implanted defibrillator, chronic diuretic use. He denies any recent weight changes.  Past Medical History:  Diagnosis Date  . Cat scratch fever    removed mass on right side neck  . Cataracts, bilateral   . CHF (congestive heart failure) (HCC)   . Diabetes mellitus without complication (HCC)    TYPE 2  . High cholesterol   . Hypertension   . Kidney stones   . Morbid obesity (HCC)   . Nonischemic cardiomyopathy (HCC)    a. ? 2009 Cath in [place] - nl cors per pt;  b. 09/2013 Echo: EF 25-30%, sev diff HK.  . Osteoarthritis   . PAF (paroxysmal atrial fibrillation) (HCC)    a. post-op hip in 2014 - prev on xarelto.  . Renal insufficiency     Patient Active Problem List   Diagnosis Date Noted  . Acute respiratory failure with hypoxia (HCC) 09/27/2017  . CHF exacerbation (HCC) 09/27/2017  . Renal insufficiency   . Acute on chronic systolic CHF (congestive heart failure) (HCC) 07/08/2017  . Left knee pain 07/08/2017  . Acute bronchitis 05/10/2017  . Acute congestive heart failure (HCC) 04/14/2017  . Elevated troponin 04/14/2017  . Lactic  acidosis   . Acute on chronic systolic heart failure, NYHA class 2 (HCC) 02/03/2017  . Diabetes mellitus type 2 in obese (HCC) 02/03/2017  . CAP (community acquired pneumonia) 02/03/2017  . Lobar pneumonia (HCC)   . Diabetes mellitus with complication (HCC)   . Acute on chronic respiratory failure with hypoxia (HCC)   . Acute on chronic combined systolic (congestive) and diastolic (congestive) heart failure (HCC) 05/19/2016  . Depression 05/19/2016  . Gout 05/19/2016  . 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 NSVT (nonsustained ventricular tachycardia) (HCC)   . Chronic renal insufficiency, stage III (moderate) (HCC)   . Pulmonary embolism (HCC)   . Acute respiratory failure (HCC)   . Osteoarthritis of left hip 10/25/2013  . Atrial fibrillation (HCC) 10/25/2013  . S/P hip replacement 10/24/2013  . Nonischemic cardiomyopathy (HCC)   . Obesity (BMI 30-39.9)   . Essential hypertension   . Hyperlipidemia     Past Surgical History:  Procedure Laterality Date  . CARDIAC CATHETERIZATION  1980's  . CIRCUMCISION  1940's  . CYSTOSCOPY WITH URETHRAL DILATATION  10/23/2013  . CYSTOSCOPY WITH URETHRAL DILATATION N/A 10/23/2013   Procedure: CYSTOSCOPY WITH URETHRAL DILATATION;  Surgeon: Kathi Ludwig, MD;  Location: [facility] OR;  Service: Urology;  Laterality: N/A;  . INCISION AND DRAINAGE OF WOUND Right 1980's   "cat scratch" (10/23/2013)  . INGUINAL HERNIA REPAIR Right  1950's  . KIDNEY STONE SURGERY Right 1970's; 1983   "twice"  . TONSILLECTOMY AND ADENOIDECTOMY  1940's  . TOTAL HIP ARTHROPLASTY Left 10/23/2013  . TOTAL HIP ARTHROPLASTY Left 10/23/2013   Procedure: TOTAL HIP ARTHROPLASTY;  Surgeon: Valeria Batman, MD;  Location: [facility] OR;  Service: Orthopedics;  Laterality: Left;       Home Medications    Prior to Admission medications   Medication Sig Start Date End Date Taking? Authorizing Provider  allopurinol (ZYLOPRIM) 100 MG tablet Take 200 mg  by mouth daily.    [provider]  apixaban (ELIQUIS) 5 MG TABS tablet Take 1 tablet (5 mg total) by mouth 2 (two) times daily. 02/06/17   Amin, Loura Halt, MD  carvedilol (COREG) 12.5 MG tablet Take 0.5 tablets (6.25 mg total) by mouth 2 (two) times daily with a meal. 04/15/17   Osvaldo Shipper, MD  cyanocobalamin 1000 MCG tablet Take 1,000 mcg by mouth daily.     [provider]  diclofenac sodium (VOLTAREN) 1 % GEL Apply 4 g topically 4 (four) times daily. 07/10/17   Valentino Nose, MD  diphenhydrAMINE (BENADRYL) 25 mg capsule Take 25 mg by mouth at bedtime as needed for sleep.    [provider]  doxycycline (VIBRAMYCIN) 100 MG capsule Take 1 capsule (100 mg total) by mouth 2 (two) times daily. 11/06/17   Rolland Porter, MD  finasteride (PROSCAR) 5 MG tablet Take 5 mg by mouth daily.    [provider]  furosemide (LASIX) 20 MG tablet Take 3 tablets (60 mg total) by mouth 2 (two) times daily. 07/10/17   Valentino Nose, MD  glipiZIDE (GLUCOTROL) 5 MG tablet Take 5 mg by mouth daily before breakfast.    [provider]  hydrALAZINE (APRESOLINE) 25 MG tablet Take 25 mg by mouth 3 (three) times daily.    [provider]  HYDROcodone-acetaminophen (NORCO/VICODIN) 5-325 MG tablet Take 2 tablets by mouth every 4 (four) hours as needed. Patient taking differently: Take 1 tablet by mouth 2 (two) times daily.  04/09/16   Isa Rankin, MD  ipratropium-albuterol (DUONEB) 0.5-2.5 (3) MG/3ML SOLN Take 3 mLs by nebulization every 4 (four) hours as needed. 05/14/17   Pearson Grippe, MD  isosorbide dinitrate (DILATRATE-SR) 40 MG CR capsule Take 40 mg by mouth 3 (three) times daily.    [provider]  ketoconazole (NIZORAL) 2 % shampoo Apply 1 application topically 2 (two) times a week.    [provider]  MAGNESIUM PO Take 0.5 tablets by mouth 2 (two) times daily.    [provider]  potassium chloride (K-DUR) 10 MEQ tablet Take 2 tablets  (20 mEq total) by mouth daily. 07/10/17 09/17/17  Valentino Nose, MD  sertraline (ZOLOFT) 25 MG tablet Take 50 mg by mouth daily as needed (for anxiety). Takes 1-2 times per week    [provider]  simvastatin (ZOCOR) 80 MG tablet Take 40 mg by mouth daily.    [provider]  spironolactone (ALDACTONE) 25 MG tablet Take 25 mg by mouth daily.    [provider]  tamsulosin (FLOMAX) 0.4 MG CAPS capsule Take 0.4 mg by mouth at bedtime.    [provider]    Family History Family History  Problem Relation Age of Onset  . Heart disease Mother   . Heart Problems Maternal Grandmother   . Other Unknown        negative for premature CAD    Social History Social History  Tobacco Use  . Smoking status: Former Smoker    Packs/day: 1.00    Years: 15.00    Pack years: 15.00    Types: Cigarettes, Cigars  . Smokeless tobacco: Former Neurosurgeon  . Tobacco comment: quit smoking ~ 50 yr ago  Substance Use Topics  . Alcohol use: No    Alcohol/week: 1.2 oz    Types: 2 Cans of beer per week    Comment: rare beer.  . Drug use: No     Allergies   Lisinopril   Review of Systems Review of Systems  Constitutional:       Per HPI, otherwise negative  HENT:       Per HPI, otherwise negative  Respiratory:       Per HPI, otherwise negative  Cardiovascular:       Per HPI, otherwise negative  Gastrointestinal: Negative for vomiting.  Endocrine:       Negative aside from HPI  Genitourinary:       Neg aside from HPI   Musculoskeletal:       Per HPI, otherwise negative  Skin: Negative.   Neurological: Negative for syncope.     Physical Exam Updated Vital Signs BP 137/89   Pulse 82   Temp 98.1 F (36.7 C) (Oral)   Resp 17   Ht 5\' 7"  (1.702 m)   Wt 114.8 kg (253 lb)   SpO2 97%   BMI 39.63 kg/m   Physical Exam  Constitutional: He is oriented to person, place, and time. He appears well-developed. No distress.  HENT:  Head: Normocephalic and  atraumatic.  Eyes: Conjunctivae and EOM are normal.  Cardiovascular: An irregularly irregular rhythm present.  Pulmonary/Chest: No stridor. He has decreased breath sounds.  Abdominal: He exhibits no distension.  Musculoskeletal: He exhibits no edema.  Neurological: He is alert and oriented to person, place, and time.  Skin: Skin is warm and dry.  Psychiatric: He has a normal mood and affect.  Nursing note and vitals reviewed.    ED Treatments / Results  Labs (all labs ordered are listed, but only abnormal results are displayed) Labs Reviewed  COMPREHENSIVE METABOLIC PANEL - Abnormal; Notable for the following components:      Result Value   Glucose, Bld 115 (*)    Albumin 3.3 (*)    GFR calc non Af Amer 53 (*)    All other components within normal limits  CBC WITH DIFFERENTIAL/PLATELET - Abnormal; Notable for the following components:   RBC 3.90 (*)    Hemoglobin 11.7 (*)    HCT 36.0 (*)    All other components within normal limits  TROPONIN I - Abnormal; Notable for the following components:   Troponin I 0.05 (*)    All other components within normal limits  BRAIN NATRIURETIC PEPTIDE - Abnormal; Notable for the following components:   B Natriuretic Peptide 266.5 (*)    All other components within normal limits    EKG  EKG Interpretation  Date/Time:  Saturday December 24 2017 11:58:39 EST Ventricular Rate:  87 PR Interval:    QRS Duration: 105 QT Interval:  397 QTC Calculation: 478 R Axis:   45 Text Interpretation:  Atrial fibrillation Nonspecific T abnrm, anterolateral leads Borderline prolonged QT interval Baseline wander in lead(s) II III aVL aVF No significant change since last tracing Abnormal ekg Confirmed by Gerhard Munch [phone]) on 12/24/2017 12:26:44 PM       Radiology Dg Chest 2 View  Result Date: 12/24/2017 CLINICAL DATA:  Cough.  Possible pneumonia. EXAM: CHEST  2 VIEW COMPARISON:  11/16/2017 FINDINGS: Chronic cardiomegaly. Chronic aortic  atherosclerosis. Pacemaker/AICD in place, unchanged. The pulmonary vascularity is normal. I think there is mild atelectasis at the right lung base which could indicate mild pneumonia. No dense consolidation or lobar collapse. No effusions. Bony structures are unremarkable. IMPRESSION: Mild patchy volume loss/pneumonia at the right base. Chronic cardiomegaly, aortic atherosclerosis and pacemaker/AIC Electronically Signed   By: Paulina Fusi M.D.   On: 12/24/2017 12:53    Procedures Procedures (including critical care time)  Medications Ordered in ED Medications  furosemide (LASIX) injection 60 mg (60 mg Intravenous Given 12/24/17 1407)     Initial Impression / Assessment and Plan / ED Course  I have reviewed the triage vital signs and the nursing notes.  Pertinent labs & imaging results that were available during my care of the patient were reviewed by me and considered in my medical decision making (see chart for details).   Notable for 7 prior ED visits in 6 months, including one recently with similar presentation. Patient has multiple medical issues including CHF, defibrillator.  Update: Patient appears calm, vital signs unremarkable.    3:34 PM Patient producing substantial urine. Results similar to multiple prior studies, and his complaints are gone. We discussed findings thus far, including reassuring, similar labs, though with persistent mild borderline troponin elevation, persistent heart failure symptoms, with no evidence for decompensation. We discussed his x-ray, EKG, need for increased Lasix after IV bolus dose here. Patient will follow up with congestive heart clinic in 4 days as a schedule and in the interim will use increased dose of Lasix. Absent ongoing pain, respiratory compromise, fever, complaints, patient appropriate for discharge with close outpatient follow-up.  Final Clinical Impressions(s) / ED Diagnoses   Final diagnoses:  SOB (shortness of breath)       Gerhard Munch, MD 12/24/17 1536

## 2017-12-24 NOTE — Discharge Instructions (Signed)
As discussed, it is important to take all of your medication as directed, with the exception of your Lasix, which it is important that you take one additional tablet for each of the next 3 days, prior to following up with your physician.  If you develop new, or concerning changes return here.

## 2018-01-03 ENCOUNTER — Ambulatory Visit: Payer: Non-veteran care | Admitting: Podiatry

## 2018-01-16 ENCOUNTER — Ambulatory Visit (INDEPENDENT_AMBULATORY_CARE_PROVIDER_SITE_OTHER): Payer: Non-veteran care | Admitting: Podiatry

## 2018-01-16 DIAGNOSIS — M79676 Pain in unspecified toe(s): Secondary | ICD-10-CM | POA: Diagnosis not present

## 2018-01-16 DIAGNOSIS — B351 Tinea unguium: Secondary | ICD-10-CM

## 2018-01-16 DIAGNOSIS — E0843 Diabetes mellitus due to underlying condition with diabetic autonomic (poly)neuropathy: Secondary | ICD-10-CM

## 2018-01-18 NOTE — Progress Notes (Signed)
   SUBJECTIVE Patient with a history of diabetes mellitus presents to office today complaining of elongated, thickened nails. Pain while ambulating in shoes. Patient is unable to trim their own nails.   Past Medical History:  Diagnosis Date  . Cat scratch fever    removed mass on right side neck  . Cataracts, bilateral   . CHF (congestive heart failure) (HCC)   . Diabetes mellitus without complication (HCC)    TYPE 2  . High cholesterol   . Hypertension   . Kidney stones   . Morbid obesity (HCC)   . Nonischemic cardiomyopathy (HCC)    a. ? 2009 Cath in [place] - nl cors per pt;  b. 09/2013 Echo: EF 25-30%, sev diff HK.  . Osteoarthritis   . PAF (paroxysmal atrial fibrillation) (HCC)    a. post-op hip in 2014 - prev on xarelto.  . Renal insufficiency     OBJECTIVE General Patient is awake, alert, and oriented x 3 and in no acute distress. Derm Skin is dry and supple bilateral. Negative open lesions or macerations. Remaining integument unremarkable. Nails are tender, long, thickened and dystrophic with subungual debris, consistent with onychomycosis, 1-5 bilateral. No signs of infection noted. Vasc  DP and PT pedal pulses palpable bilaterally. Temperature gradient within normal limits.  Neuro Epicritic and protective threshold sensation diminished bilaterally.  Musculoskeletal Exam No symptomatic pedal deformities noted bilateral. Muscular strength within normal limits.  ASSESSMENT 1. Diabetes Mellitus w/ peripheral neuropathy 2. Onychomycosis of nail due to dermatophyte bilateral 3. Pain in foot bilateral  PLAN OF CARE 1. Patient evaluated today. 2. Instructed to maintain good pedal hygiene and foot care. Stressed importance of controlling blood sugar.  3. Mechanical debridement of nails 1-5 bilaterally performed using a nail nipper. Filed with dremel without incident.  4. Return to clinic in 3 mos.     Felecia Shelling, DPM [facility]  Dr. Felecia Shelling, DPM    [address]                                        [place], [place] [address]                Office [phone]  Fax [phone]

## 2018-02-04 ENCOUNTER — Emergency Department (HOSPITAL_COMMUNITY)
Admission: EM | Admit: 2018-02-04 | Discharge: 2018-02-04 | Disposition: A | Discharge status: Home / Self Care | Payer: Non-veteran care | Attending: Emergency Medicine | Admitting: Emergency Medicine

## 2018-02-04 ENCOUNTER — Emergency Department (HOSPITAL_COMMUNITY): Payer: Non-veteran care

## 2018-02-04 ENCOUNTER — Encounter (HOSPITAL_COMMUNITY): Payer: Self-pay | Admitting: *Deleted

## 2018-02-04 DIAGNOSIS — Z96642 Presence of left artificial hip joint: Secondary | ICD-10-CM | POA: Insufficient documentation

## 2018-02-04 DIAGNOSIS — J449 Chronic obstructive pulmonary disease, unspecified: Secondary | ICD-10-CM | POA: Insufficient documentation

## 2018-02-04 DIAGNOSIS — N183 Chronic kidney disease, stage 3 (moderate): Secondary | ICD-10-CM | POA: Diagnosis not present

## 2018-02-04 DIAGNOSIS — Z79899 Other long term (current) drug therapy: Secondary | ICD-10-CM | POA: Insufficient documentation

## 2018-02-04 DIAGNOSIS — I480 Paroxysmal atrial fibrillation: Secondary | ICD-10-CM | POA: Insufficient documentation

## 2018-02-04 DIAGNOSIS — Z87891 Personal history of nicotine dependence: Secondary | ICD-10-CM | POA: Diagnosis not present

## 2018-02-04 DIAGNOSIS — R0602 Shortness of breath: Secondary | ICD-10-CM | POA: Diagnosis present

## 2018-02-04 DIAGNOSIS — E1122 Type 2 diabetes mellitus with diabetic chronic kidney disease: Secondary | ICD-10-CM | POA: Insufficient documentation

## 2018-02-04 DIAGNOSIS — Z7901 Long term (current) use of anticoagulants: Secondary | ICD-10-CM | POA: Diagnosis not present

## 2018-02-04 DIAGNOSIS — Z7984 Long term (current) use of oral hypoglycemic drugs: Secondary | ICD-10-CM | POA: Diagnosis not present

## 2018-02-04 DIAGNOSIS — I509 Heart failure, unspecified: Secondary | ICD-10-CM

## 2018-02-04 DIAGNOSIS — I130 Hypertensive heart and chronic kidney disease with heart failure and stage 1 through stage 4 chronic kidney disease, or unspecified chronic kidney disease: Secondary | ICD-10-CM | POA: Insufficient documentation

## 2018-02-04 DIAGNOSIS — I5043 Acute on chronic combined systolic (congestive) and diastolic (congestive) heart failure: Secondary | ICD-10-CM | POA: Insufficient documentation

## 2018-02-04 LAB — COMPREHENSIVE METABOLIC PANEL
ALK PHOS: 56 U/L (ref 38–126)
ALT: 19 U/L (ref 17–63)
ANION GAP: 12 (ref 5–15)
AST: 23 U/L (ref 15–41)
Albumin: 3.2 g/dL — ABNORMAL LOW (ref 3.5–5.0)
BUN: 19 mg/dL (ref 6–20)
CALCIUM: 9 mg/dL (ref 8.9–10.3)
CO2: 25 mmol/L (ref 22–32)
CREATININE: 1.22 mg/dL (ref 0.61–1.24)
Chloride: 103 mmol/L (ref 101–111)
GFR calc Af Amer: >60 mL/min (ref >60)
GFR, EST NON AFRICAN AMERICAN: 54 mL/min — AB (ref >60)
Glucose, Bld: 140 mg/dL — ABNORMAL HIGH (ref 65–99)
Potassium: 3.4 mmol/L — ABNORMAL LOW (ref 3.5–5.1)
SODIUM: 140 mmol/L (ref 135–145)
Total Bilirubin: 0.8 mg/dL (ref 0.3–1.2)
Total Protein: 7 g/dL (ref 6.5–8.1)

## 2018-02-04 LAB — URINALYSIS, ROUTINE W REFLEX MICROSCOPIC
BILIRUBIN URINE: NEGATIVE
Glucose, UA: NEGATIVE mg/dL
Ketones, ur: NEGATIVE mg/dL
Nitrite: POSITIVE — AB
PH: 5 (ref 5.0–8.0)
Protein, ur: NEGATIVE mg/dL
SPECIFIC GRAVITY, URINE: 1.013 (ref 1.005–1.030)

## 2018-02-04 LAB — PROTIME-INR
INR: 1.21
PROTHROMBIN TIME: 15.2 s (ref 11.4–15.2)

## 2018-02-04 LAB — CBC WITH DIFFERENTIAL/PLATELET
BASOS ABS: 0 10*3/uL (ref 0.0–0.1)
BASOS PCT: 0 %
EOS ABS: 0.2 10*3/uL (ref 0.0–0.7)
Eosinophils Relative: 2 %
HEMATOCRIT: 35.5 % — AB (ref 39.0–52.0)
HEMOGLOBIN: 11.4 g/dL — AB (ref 13.0–17.0)
Lymphocytes Relative: 19 %
Lymphs Abs: 1.5 10*3/uL (ref 0.7–4.0)
MCH: 29.4 pg (ref 26.0–34.0)
MCHC: 32.1 g/dL (ref 30.0–36.0)
MCV: 91.5 fL (ref 78.0–100.0)
MONOS PCT: 7 %
Monocytes Absolute: 0.6 10*3/uL (ref 0.1–1.0)
NEUTROS ABS: 5.9 10*3/uL (ref 1.7–7.7)
NEUTROS PCT: 72 %
Platelets: 240 10*3/uL (ref 150–400)
RBC: 3.88 MIL/uL — AB (ref 4.22–5.81)
RDW: 14.4 % (ref 11.5–15.5)
WBC: 8.1 10*3/uL (ref 4.0–10.5)

## 2018-02-04 LAB — I-STAT TROPONIN, ED: TROPONIN I, POC: 0.04 ng/mL (ref 0.00–0.08)

## 2018-02-04 LAB — BRAIN NATRIURETIC PEPTIDE: B Natriuretic Peptide: 368.6 pg/mL — ABNORMAL HIGH (ref 0.0–100.0)

## 2018-02-04 MED ORDER — APIXABAN 5 MG PO TABS
5.0000 mg | ORAL_TABLET | Freq: Two times a day (BID) | ORAL | Status: DC
  Administered 2018-02-04: 5 mg via ORAL
  Filled 2018-02-04: qty 1

## 2018-02-04 MED ORDER — HYDROCODONE-ACETAMINOPHEN 5-325 MG PO TABS
1.0000 | ORAL_TABLET | Freq: Once | ORAL | Status: AC
  Administered 2018-02-04: 1 via ORAL
  Filled 2018-02-04: qty 1

## 2018-02-04 MED ORDER — CARVEDILOL 12.5 MG PO TABS
12.5000 mg | ORAL_TABLET | Freq: Two times a day (BID) | ORAL | Status: DC
  Administered 2018-02-04: 12.5 mg via ORAL
  Filled 2018-02-04: qty 1

## 2018-02-04 NOTE — ED Notes (Signed)
Ambulated pt on SpO2 monitor, pt was at 93-95%. Notified Dr. Donnald Garre.

## 2018-02-04 NOTE — ED Triage Notes (Signed)
To ED from home [facility] for eval of SOB. Pt states he used his treatment last pm and just didn't feel as well this am so come to the hospital before sob increased. Denies fevers. Min cough over the past few days. Min to no lower extremity swelling. RA sats 93% - placed on 2L [place] at up to 97%

## 2018-02-04 NOTE — ED Provider Notes (Signed)
[name] EMERGENCY DEPARTMENT Provider Note   CSN: [id number] Arrival date & time: 02/04/18  4098     History   Chief Complaint Chief Complaint  Patient presents with  . Shortness of Breath    HPI Ryan Macdonald is a 84 y.o. male.  HPI Past medical history significant for CHF, diabetes, paroxysmal atrial fibrillation, hypertension, nonischemic cardiomyopathy.  Patient reports last night he started to feel moderately short of breath.  He tried a nebulizer treatment which seemed to improve things somewhat.  This morning however in the early morning hours he felt short of breath and had to get up and down from bed.  He went outside to try to improve his breathing.  He continued to feel short of breath.  He called EMS.  Symptoms improved significantly on supplemental oxygen.  No chest pain, no nausea, no diaphoresis.  Patient reports he is vigilant in monitoring his weight and has not had weight gain.  He has been staying at baseline of approximately 252.  No increased lower extremity edema.  Patient reports he is compliant with his medications.  He reports he was having more problems with shortness of breath a couple months ago but his Lasix dose was increased which helped that problem.  Patient reports he feels like he needs supplemental home oxygen but his provider at the [place] has not approved and arranged for him.  Patient reports he has diagnosis of COPD but rarely needs his nebulizer.  He reports he does not use it more than a few times a month if that.  He reports he only has it, per his instructions, for shortness of breath as needed. Past Medical History:  Diagnosis Date  . Cat scratch fever    removed mass on right side neck  . Cataracts, bilateral   . CHF (congestive heart failure) (HCC)   . Diabetes mellitus without complication (HCC)    TYPE 2  . High cholesterol   . Hypertension   . Kidney stones   . Morbid obesity (HCC)   . Nonischemic cardiomyopathy  (HCC)    a. ? 2009 Cath in [place] - nl cors per pt;  b. 09/2013 Echo: EF 25-30%, sev diff HK.  . Osteoarthritis   . PAF (paroxysmal atrial fibrillation) (HCC)    a. post-op hip in 2014 - prev on xarelto.  . Renal insufficiency     Patient Active Problem List   Diagnosis Date Noted  . Acute respiratory failure with hypoxia (HCC) 09/27/2017  . CHF exacerbation (HCC) 09/27/2017  . Renal insufficiency   . Acute on chronic systolic CHF (congestive heart failure) (HCC) 07/08/2017  . Left knee pain 07/08/2017  . Acute bronchitis 05/10/2017  . Acute congestive heart failure (HCC) 04/14/2017  . Elevated troponin 04/14/2017  . Lactic acidosis   . Acute on chronic systolic heart failure, NYHA class 2 (HCC) 02/03/2017  . Diabetes mellitus type 2 in obese (HCC) 02/03/2017  . CAP (community acquired pneumonia) 02/03/2017  . Lobar pneumonia (HCC)   . Diabetes mellitus with complication (HCC)   . Acute on chronic respiratory failure with hypoxia (HCC)   . Acute on chronic combined systolic (congestive) and diastolic (congestive) heart failure (HCC) 05/19/2016  . Depression 05/19/2016  . Gout 05/19/2016  . 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 NSVT (nonsustained ventricular tachycardia) (HCC)   . Chronic renal insufficiency, stage III (moderate) (HCC)   . Pulmonary embolism (HCC)   . Acute respiratory failure (HCC)   .  Osteoarthritis of left hip 10/25/2013  . Atrial fibrillation (HCC) 10/25/2013  . S/P hip replacement 10/24/2013  . Nonischemic cardiomyopathy (HCC)   . Obesity (BMI 30-39.9)   . Essential hypertension   . Hyperlipidemia     Past Surgical History:  Procedure Laterality Date  . CARDIAC CATHETERIZATION  1980's  . CIRCUMCISION  1940's  . CYSTOSCOPY WITH URETHRAL DILATATION  10/23/2013  . CYSTOSCOPY WITH URETHRAL DILATATION N/A 10/23/2013   Procedure: CYSTOSCOPY WITH URETHRAL DILATATION;  Surgeon: Kathi Ludwig, MD;  Location: [facility] OR;  Service:  Urology;  Laterality: N/A;  . INCISION AND DRAINAGE OF WOUND Right 1980's   "cat scratch" (10/23/2013)  . INGUINAL HERNIA REPAIR Right 1950's  . KIDNEY STONE SURGERY Right 1970's; 1983   "twice"  . TONSILLECTOMY AND ADENOIDECTOMY  1940's  . TOTAL HIP ARTHROPLASTY Left 10/23/2013  . TOTAL HIP ARTHROPLASTY Left 10/23/2013   Procedure: TOTAL HIP ARTHROPLASTY;  Surgeon: Valeria Batman, MD;  Location: [facility] OR;  Service: Orthopedics;  Laterality: Left;       Home Medications    Prior to Admission medications   Medication Sig Start Date End Date Taking? Authorizing Provider  allopurinol (ZYLOPRIM) 100 MG tablet Take 200 mg by mouth daily.    [provider]  apixaban (ELIQUIS) 5 MG TABS tablet Take 1 tablet (5 mg total) by mouth 2 (two) times daily. 02/06/17   Amin, Loura Halt, MD  carvedilol (COREG) 12.5 MG tablet Take 0.5 tablets (6.25 mg total) by mouth 2 (two) times daily with a meal. 04/15/17   Osvaldo Shipper, MD  cyanocobalamin 1000 MCG tablet Take 1,000 mcg by mouth daily.     [provider]  diclofenac sodium (VOLTAREN) 1 % GEL Apply 4 g topically 4 (four) times daily. 07/10/17   Valentino Nose, MD  diphenhydrAMINE (BENADRYL) 25 mg capsule Take 25 mg by mouth at bedtime as needed for sleep.    [provider]  doxycycline (VIBRAMYCIN) 100 MG capsule Take 1 capsule (100 mg total) by mouth 2 (two) times daily. Patient not taking: Reported on 12/24/2017 11/06/17   Rolland Porter, MD  finasteride (PROSCAR) 5 MG tablet Take 5 mg by mouth daily.    [provider]  furosemide (LASIX) 20 MG tablet Take 3 tablets (60 mg total) by mouth 2 (two) times daily. 07/10/17   Valentino Nose, MD  glipiZIDE (GLUCOTROL) 5 MG tablet Take 5 mg by mouth daily before breakfast.    [provider]  hydrALAZINE (APRESOLINE) 25 MG tablet Take 25 mg by mouth 3 (three) times daily.    [provider]  HYDROcodone-acetaminophen (NORCO/VICODIN) 5-325 MG tablet  Take 2 tablets by mouth every 4 (four) hours as needed. Patient taking differently: Take 1 tablet by mouth 2 (two) times daily.  04/09/16   Isa Rankin, MD  ipratropium-albuterol (DUONEB) 0.5-2.5 (3) MG/3ML SOLN Take 3 mLs by nebulization every 4 (four) hours as needed. 05/14/17   Pearson Grippe, MD  isosorbide dinitrate (DILATRATE-SR) 40 MG CR capsule Take 40 mg by mouth 3 (three) times daily.    [provider]  ketoconazole (NIZORAL) 2 % shampoo Apply 1 application topically 2 (two) times a week.    [provider]  MAGNESIUM PO Take 0.5 tablets by mouth 2 (two) times daily.    [provider]  potassium chloride (K-DUR) 10 MEQ tablet Take 2 tablets (20 mEq total) by mouth daily. 07/10/17 12/24/17  Valentino Nose, MD  sertraline (ZOLOFT) 25 MG tablet Take 50  mg by mouth daily as needed (for anxiety). Takes 1-2 times per week    [provider]  spironolactone (ALDACTONE) 25 MG tablet Take 25 mg by mouth daily.    [provider]  tamsulosin (FLOMAX) 0.4 MG CAPS capsule Take 0.4 mg by mouth at bedtime.    [provider]    Family History Family History  Problem Relation Age of Onset  . Heart disease Mother   . Heart Problems Maternal Grandmother   . Other Unknown        negative for premature CAD    Social History Social History   Tobacco Use  . Smoking status: Former Smoker    Packs/day: 1.00    Years: 15.00    Pack years: 15.00    Types: Cigarettes, Cigars  . Smokeless tobacco: Former Neurosurgeon  . Tobacco comment: quit smoking ~ 50 yr ago  Substance Use Topics  . Alcohol use: No    Alcohol/week: 1.2 oz    Types: 2 Cans of beer per week    Comment: rare beer.  . Drug use: No     Allergies   Lisinopril   Review of Systems Review of Systems 10 Systems reviewed and are negative for acute change except as noted in the HPI.   Physical Exam Updated Vital Signs BP 117/78   Pulse 89   Temp 98.5 F (36.9 C) (Oral)   Resp  (!) 21   SpO2 92%   Physical Exam  Constitutional:  Patient is alert and nontoxic.  Mental status clear.  No respite tori distress at rest.  He is on supplemental nasal cannula oxygen.  Moderate obesity.  HENT:  Head: Normocephalic and atraumatic.  Mouth/Throat: Oropharynx is clear and moist.  Eyes: EOM are normal.  Neck: Neck supple.  Cardiovascular:  Irregularly irregular.  Rate controlled in the 90s on the monitor atrial fibrillation.  No gross rub murmur gallop.  Pulmonary/Chest:  No significant increased work of breathing.  Breath sounds are soft but no gross wheeze rhonchi or rale.  Abdominal: Soft. He exhibits no distension. There is no tenderness. There is no guarding.  Musculoskeletal: Normal range of motion. He exhibits no tenderness.  Trace pitting edema.  Feet and legs are in excellent condition.  No wounds, no cellulitis  Skin: Skin is warm and dry.  Psychiatric: He has a normal mood and affect.     ED Treatments / Results  Labs (all labs ordered are listed, but only abnormal results are displayed) Labs Reviewed  COMPREHENSIVE METABOLIC PANEL - Abnormal; Notable for the following components:      Result Value   Potassium 3.4 (*)    Glucose, Bld 140 (*)    Albumin 3.2 (*)    GFR calc non Af Amer 54 (*)    All other components within normal limits  CBC WITH DIFFERENTIAL/PLATELET - Abnormal; Notable for the following components:   RBC 3.88 (*)    Hemoglobin 11.4 (*)    HCT 35.5 (*)    All other components within normal limits  BRAIN NATRIURETIC PEPTIDE - Abnormal; Notable for the following components:   B Natriuretic Peptide 368.6 (*)    All other components within normal limits  URINALYSIS, ROUTINE W REFLEX MICROSCOPIC - Abnormal; Notable for the following components:   APPearance CLOUDY (*)    Hgb urine dipstick MODERATE (*)    Nitrite POSITIVE (*)    Leukocytes, UA LARGE (*)    Bacteria, UA FEW (*)  Squamous Epithelial / LPF 0-5 (*)    All other  components within normal limits  PROTIME-INR  I-STAT TROPONIN, ED    EKG  EKG Interpretation  Date/Time:  Saturday February 04 2018 08:48:56 EST Ventricular Rate:  97 PR Interval:    QRS Duration: 103 QT Interval:  368 QTC Calculation: 473 R Axis:   41 Text Interpretation:  Borderline low voltage, extremity leads Abnormal T, consider ischemia, lateral leads probable a fib. no sig change from previous Confirmed by Arby Barrette [phone]) on 02/04/2018 8:59:35 AM       Radiology Dg Chest 2 View  Result Date: 02/04/2018 CLINICAL DATA:  Shortness of breath. EXAM: CHEST - 2 VIEW COMPARISON:  December 24, 2017 FINDINGS: Cardiomegaly. Tortuous thoracic aorta. Single lead AICD device, unchanged. No pneumothorax. No nodules or masses. No focal infiltrates or edema. IMPRESSION: No active cardiopulmonary disease. Electronically Signed   By: Gerome Sam III M.D   On: 02/04/2018 09:52    Procedures Procedures (including critical care time)  Medications Ordered in ED Medications  HYDROcodone-acetaminophen (NORCO/VICODIN) 5-325 MG per tablet 1 tablet (1 tablet Oral Given 02/04/18 1027)     Initial Impression / Assessment and Plan / ED Course  I have reviewed the triage vital signs and the nursing notes.  Pertinent labs & imaging results that were available during my care of the patient were reviewed by me and considered in my medical decision making (see chart for details).      Final Clinical Impressions(s) / ED Diagnoses   Final diagnoses:  Shortness of breath  Acute on chronic congestive heart failure, unspecified heart failure type (HCC)  Paroxysmal atrial fibrillation (HCC)  Chronic obstructive pulmonary disease, unspecified COPD type (HCC)  Patient has known history of paroxysmal atrial fibrillation.  He is in atrial fibrillation at this time with rate control.  Very mild symptoms of congestive heart failure.  Diagnostic evaluation does not suggest acute ischemic event nor  decompensated CHF.  Patient ambulated pulse ox without desaturation and heart rate remaining less than 100.  Patient's mental status is very clear and he is compliant and vigilant with his medical management.  At this time feel patient is stable to increase his Coreg based on blood pressure parameters.  Ideally keeping heart rate less than 80.  Also patient will increase his Lasix dose for the next day and a half.  He is aware of signs and symptoms worsen to return.  He will [facility] this [facility].  ED Discharge Orders    None       Arby Barrette, MD 02/04/18 [phone]

## 2018-02-04 NOTE — ED Notes (Signed)
Dr. Donnald Garre in to speak with patient.  Instructions are to take Lasix 80 mg when he gets home today and 80 mg tonight and 80 mg in the morning and 80 mg tomorrow night.  Explained Coreg dosing.  Pt verbalized understanding.

## 2018-02-04 NOTE — ED Notes (Signed)
Returned from xray

## 2018-02-04 NOTE — Discharge Instructions (Signed)
1.  Take an additional dose of Coreg 12.5 mg when you get home, take Coreg your regular 12.5 mg tonight.  Tomorrow morning take Coreg 25 mg (2 tablets) and your usual 12.5 mg evening dose.  Monitor your blood pressures 3 times a day.  If your blood pressures are remaining at 120 systolic (upper number or greater) and heart rate is remaining 60 or greater, continue taking 25 mg in the morning and 12.5 mg at night.  If blood pressures or heart rate are lower, then resume your 12.5 mg twice daily. 2.  Take Lasix 40 mg when you get home.  Then take her normal nighttime dose.  Tomorrow, again take an additional 40 mg at lunchtime.  Then return to your normal schedule dosing. 3.  Follow-up with your cardiologist or [facility] provider this week for re-evaluation of paroxysmal atrial fibrillation and potential criteria for HOME OXYGEN therapy.

## 2018-02-13 ENCOUNTER — Inpatient Hospital Stay (HOSPITAL_COMMUNITY)
Admission: EM | Admit: 2018-02-13 | Discharge: 2018-02-16 | DRG: 292 | Disposition: A | Discharge status: Home / Self Care | Payer: Medicare Other | Attending: Internal Medicine | Admitting: Internal Medicine

## 2018-02-13 ENCOUNTER — Emergency Department (HOSPITAL_COMMUNITY): Payer: Medicare Other

## 2018-02-13 ENCOUNTER — Encounter (HOSPITAL_COMMUNITY): Payer: Self-pay | Admitting: Emergency Medicine

## 2018-02-13 DIAGNOSIS — I481 Persistent atrial fibrillation: Secondary | ICD-10-CM | POA: Diagnosis present

## 2018-02-13 DIAGNOSIS — Z7901 Long term (current) use of anticoagulants: Secondary | ICD-10-CM | POA: Diagnosis not present

## 2018-02-13 DIAGNOSIS — I110 Hypertensive heart disease with heart failure: Secondary | ICD-10-CM | POA: Diagnosis present

## 2018-02-13 DIAGNOSIS — F32A Depression, unspecified: Secondary | ICD-10-CM | POA: Diagnosis present

## 2018-02-13 DIAGNOSIS — Z87891 Personal history of nicotine dependence: Secondary | ICD-10-CM | POA: Diagnosis not present

## 2018-02-13 DIAGNOSIS — Z95810 Presence of automatic (implantable) cardiac defibrillator: Secondary | ICD-10-CM

## 2018-02-13 DIAGNOSIS — I5043 Acute on chronic combined systolic (congestive) and diastolic (congestive) heart failure: Secondary | ICD-10-CM | POA: Diagnosis present

## 2018-02-13 DIAGNOSIS — Z7984 Long term (current) use of oral hypoglycemic drugs: Secondary | ICD-10-CM

## 2018-02-13 DIAGNOSIS — E118 Type 2 diabetes mellitus with unspecified complications: Secondary | ICD-10-CM | POA: Diagnosis present

## 2018-02-13 DIAGNOSIS — Z79899 Other long term (current) drug therapy: Secondary | ICD-10-CM | POA: Diagnosis not present

## 2018-02-13 DIAGNOSIS — I428 Other cardiomyopathies: Secondary | ICD-10-CM | POA: Diagnosis present

## 2018-02-13 DIAGNOSIS — E119 Type 2 diabetes mellitus without complications: Secondary | ICD-10-CM | POA: Diagnosis present

## 2018-02-13 DIAGNOSIS — I4891 Unspecified atrial fibrillation: Secondary | ICD-10-CM | POA: Diagnosis present

## 2018-02-13 DIAGNOSIS — I509 Heart failure, unspecified: Secondary | ICD-10-CM | POA: Diagnosis present

## 2018-02-13 DIAGNOSIS — F339 Major depressive disorder, recurrent, unspecified: Secondary | ICD-10-CM

## 2018-02-13 DIAGNOSIS — I340 Nonrheumatic mitral (valve) insufficiency: Secondary | ICD-10-CM | POA: Diagnosis not present

## 2018-02-13 DIAGNOSIS — I480 Paroxysmal atrial fibrillation: Secondary | ICD-10-CM | POA: Diagnosis not present

## 2018-02-13 DIAGNOSIS — I10 Essential (primary) hypertension: Secondary | ICD-10-CM | POA: Diagnosis present

## 2018-02-13 DIAGNOSIS — I878 Other specified disorders of veins: Secondary | ICD-10-CM

## 2018-02-13 DIAGNOSIS — Z9111 Patient's noncompliance with dietary regimen: Secondary | ICD-10-CM | POA: Diagnosis not present

## 2018-02-13 DIAGNOSIS — Z6839 Body mass index (BMI) 39.0-39.9, adult: Secondary | ICD-10-CM | POA: Diagnosis not present

## 2018-02-13 DIAGNOSIS — Z8249 Family history of ischemic heart disease and other diseases of the circulatory system: Secondary | ICD-10-CM

## 2018-02-13 DIAGNOSIS — E876 Hypokalemia: Secondary | ICD-10-CM | POA: Diagnosis present

## 2018-02-13 DIAGNOSIS — E7800 Pure hypercholesterolemia, unspecified: Secondary | ICD-10-CM | POA: Diagnosis present

## 2018-02-13 DIAGNOSIS — Z888 Allergy status to other drugs, medicaments and biological substances status: Secondary | ICD-10-CM

## 2018-02-13 DIAGNOSIS — I5023 Acute on chronic systolic (congestive) heart failure: Secondary | ICD-10-CM

## 2018-02-13 DIAGNOSIS — Z8679 Personal history of other diseases of the circulatory system: Secondary | ICD-10-CM

## 2018-02-13 DIAGNOSIS — F329 Major depressive disorder, single episode, unspecified: Secondary | ICD-10-CM | POA: Diagnosis present

## 2018-02-13 LAB — I-STAT CHEM 8, ED
BUN: 22 mg/dL — ABNORMAL HIGH (ref 6–20)
CHLORIDE: 105 mmol/L (ref 101–111)
CREATININE: 1.2 mg/dL (ref 0.61–1.24)
Calcium, Ion: 1.05 mmol/L — ABNORMAL LOW (ref 1.15–1.40)
Glucose, Bld: 141 mg/dL — ABNORMAL HIGH (ref 65–99)
HEMATOCRIT: 38 % — AB (ref 39.0–52.0)
HEMOGLOBIN: 12.9 g/dL — AB (ref 13.0–17.0)
POTASSIUM: 5.4 mmol/L — AB (ref 3.5–5.1)
Sodium: 142 mmol/L (ref 135–145)
TCO2: 30 mmol/L (ref 22–32)

## 2018-02-13 LAB — I-STAT ARTERIAL BLOOD GAS, ED
Acid-Base Excess: 1 mmol/L (ref 0.0–2.0)
Bicarbonate: 25.6 mmol/L (ref 20.0–28.0)
O2 Saturation: 98 %
PH ART: 7.397 (ref 7.350–7.450)
PO2 ART: 103 mmHg (ref 83.0–108.0)
TCO2: 27 mmol/L (ref 22–32)
pCO2 arterial: 41.5 mmHg (ref 32.0–48.0)

## 2018-02-13 LAB — CBC WITH DIFFERENTIAL/PLATELET
Basophils Absolute: 0 10*3/uL (ref 0.0–0.1)
Basophils Relative: 0 %
EOS ABS: 0.2 10*3/uL (ref 0.0–0.7)
Eosinophils Relative: 2 %
HEMATOCRIT: 37.4 % — AB (ref 39.0–52.0)
HEMOGLOBIN: 12 g/dL — AB (ref 13.0–17.0)
LYMPHS ABS: 2.2 10*3/uL (ref 0.7–4.0)
Lymphocytes Relative: 24 %
MCH: 29.8 pg (ref 26.0–34.0)
MCHC: 32.1 g/dL (ref 30.0–36.0)
MCV: 92.8 fL (ref 78.0–100.0)
Monocytes Absolute: 0.8 10*3/uL (ref 0.1–1.0)
Monocytes Relative: 8 %
NEUTROS ABS: 6.2 10*3/uL (ref 1.7–7.7)
NEUTROS PCT: 66 %
Platelets: 261 10*3/uL (ref 150–400)
RBC: 4.03 MIL/uL — AB (ref 4.22–5.81)
RDW: 14.8 % (ref 11.5–15.5)
WBC: 9.4 10*3/uL (ref 4.0–10.5)

## 2018-02-13 LAB — GLUCOSE, CAPILLARY: Glucose-Capillary: 150 mg/dL — ABNORMAL HIGH (ref 65–99)

## 2018-02-13 LAB — I-STAT TROPONIN, ED: TROPONIN I, POC: 0.03 ng/mL (ref 0.00–0.08)

## 2018-02-13 LAB — BRAIN NATRIURETIC PEPTIDE: B Natriuretic Peptide: 381.7 pg/mL — ABNORMAL HIGH (ref 0.0–100.0)

## 2018-02-13 LAB — D-DIMER, QUANTITATIVE: D-Dimer, Quant: 0.98 ug/mL-FEU — ABNORMAL HIGH (ref 0.00–0.50)

## 2018-02-13 MED ORDER — IPRATROPIUM-ALBUTEROL 0.5-2.5 (3) MG/3ML IN SOLN: 3.0000 mL | RESPIRATORY_TRACT | Status: DC | PRN

## 2018-02-13 MED ORDER — HYDROCODONE-ACETAMINOPHEN 5-325 MG PO TABS
1.0000 | ORAL_TABLET | Freq: Once | ORAL | Status: AC
  Administered 2018-02-13: 1 via ORAL
  Filled 2018-02-13: qty 1

## 2018-02-13 MED ORDER — APIXABAN 5 MG PO TABS
5.0000 mg | ORAL_TABLET | Freq: Two times a day (BID) | ORAL | Status: DC
  Administered 2018-02-13 – 2018-02-16 (×6): 5 mg via ORAL
  Filled 2018-02-13 (×6): qty 1

## 2018-02-13 MED ORDER — INSULIN ASPART 100 UNIT/ML ~~LOC~~ SOLN
0.0000 [IU] | Freq: Three times a day (TID) | SUBCUTANEOUS | Status: DC
  Administered 2018-02-14 – 2018-02-16 (×6): 2 [IU] via SUBCUTANEOUS

## 2018-02-13 MED ORDER — FINASTERIDE 5 MG PO TABS
5.0000 mg | ORAL_TABLET | Freq: Every day | ORAL | Status: DC
  Administered 2018-02-15 – 2018-02-16 (×2): 5 mg via ORAL
  Filled 2018-02-13 (×3): qty 1

## 2018-02-13 MED ORDER — ACETAMINOPHEN 650 MG RE SUPP: 650.0000 mg | Freq: Four times a day (QID) | RECTAL | Status: DC | PRN

## 2018-02-13 MED ORDER — HYDROCODONE-ACETAMINOPHEN 5-325 MG PO TABS
1.0000 | ORAL_TABLET | Freq: Two times a day (BID) | ORAL | Status: DC | PRN
  Administered 2018-02-13 – 2018-02-15 (×3): 1 via ORAL
  Filled 2018-02-13 (×3): qty 1

## 2018-02-13 MED ORDER — ISOSORBIDE DINITRATE ER 40 MG PO CPCR
40.0000 mg | ORAL_CAPSULE | Freq: Three times a day (TID) | ORAL | Status: DC
  Administered 2018-02-13 – 2018-02-16 (×9): 40 mg via ORAL
  Filled 2018-02-13 (×10): qty 1

## 2018-02-13 MED ORDER — VITAMIN B-12 1000 MCG PO TABS
1000.0000 ug | ORAL_TABLET | Freq: Every day | ORAL | Status: DC
  Administered 2018-02-14 – 2018-02-16 (×3): 1000 ug via ORAL
  Filled 2018-02-13 (×2): qty 1

## 2018-02-13 MED ORDER — SPIRONOLACTONE 25 MG PO TABS
25.0000 mg | ORAL_TABLET | Freq: Every day | ORAL | Status: DC
  Administered 2018-02-14 – 2018-02-16 (×3): 25 mg via ORAL
  Filled 2018-02-13 (×4): qty 1

## 2018-02-13 MED ORDER — CARVEDILOL 6.25 MG PO TABS
6.2500 mg | ORAL_TABLET | Freq: Two times a day (BID) | ORAL | Status: DC
  Administered 2018-02-14 – 2018-02-16 (×6): 6.25 mg via ORAL
  Filled 2018-02-13 (×6): qty 1

## 2018-02-13 MED ORDER — ALLOPURINOL 100 MG PO TABS
200.0000 mg | ORAL_TABLET | Freq: Every day | ORAL | Status: DC
  Administered 2018-02-14 – 2018-02-16 (×3): 200 mg via ORAL
  Filled 2018-02-13 (×3): qty 2

## 2018-02-13 MED ORDER — ACETAMINOPHEN 325 MG PO TABS: 650.0000 mg | ORAL_TABLET | Freq: Four times a day (QID) | ORAL | Status: DC | PRN

## 2018-02-13 MED ORDER — SERTRALINE HCL 50 MG PO TABS
50.0000 mg | ORAL_TABLET | Freq: Every day | ORAL | Status: DC
  Administered 2018-02-13 – 2018-02-15 (×3): 50 mg via ORAL
  Filled 2018-02-13 (×3): qty 1

## 2018-02-13 MED ORDER — TAMSULOSIN HCL 0.4 MG PO CAPS
0.4000 mg | ORAL_CAPSULE | Freq: Every day | ORAL | Status: DC
  Administered 2018-02-13 – 2018-02-15 (×3): 0.4 mg via ORAL
  Filled 2018-02-13 (×3): qty 1

## 2018-02-13 MED ORDER — FUROSEMIDE 10 MG/ML IJ SOLN
80.0000 mg | Freq: Once | INTRAMUSCULAR | Status: AC
  Administered 2018-02-13: 80 mg via INTRAVENOUS
  Filled 2018-02-13: qty 8

## 2018-02-13 MED ORDER — FUROSEMIDE 10 MG/ML IJ SOLN
60.0000 mg | Freq: Two times a day (BID) | INTRAMUSCULAR | Status: DC
  Administered 2018-02-13 – 2018-02-16 (×6): 60 mg via INTRAVENOUS
  Filled 2018-02-13 (×6): qty 6

## 2018-02-13 MED ORDER — IOPAMIDOL (ISOVUE-370) INJECTION 76%
INTRAVENOUS | Status: AC
  Administered 2018-02-13: 100 mL
  Filled 2018-02-13: qty 100

## 2018-02-13 NOTE — ED Notes (Signed)
Lab called D-dimmer hemolyzed needs to reorder and recollected.

## 2018-02-13 NOTE — Plan of Care (Signed)
  Progressing Activity: Risk for activity intolerance will decrease 02/13/2018 2335 - Progressing by Carolanne Grumbling, RN 02/13/2018 2334 - Progressing by Carolanne Grumbling, RN Elimination: Will not experience complications related to bowel motility 02/13/2018 2334 - Progressing by Carolanne Grumbling, RN Pain Managment: General experience of comfort will improve 02/13/2018 2335 - Progressing by Carolanne Grumbling, RN Safety: Ability to remain free from injury will improve 02/13/2018 2335 - Progressing by Carolanne Grumbling, RN 02/13/2018 2334 - Progressing by Carolanne Grumbling, RN

## 2018-02-13 NOTE — ED Notes (Signed)
Attempted report. Floor nurse unable to take report.

## 2018-02-13 NOTE — Progress Notes (Signed)
Pt placed on Bipap at this time per MD order, pt tolerating well. RT will monitor  

## 2018-02-13 NOTE — Progress Notes (Signed)
Pt transported to CT and back to E46 on Bipap, no complications

## 2018-02-13 NOTE — ED Notes (Signed)
Got patient on the monitor into a gown patient is resting with call bell in reach  

## 2018-02-13 NOTE — Progress Notes (Signed)
Pt taken off Bipap at this time, tolerating well.  No distress noted at this time.  RT will monitor

## 2018-02-13 NOTE — ED Triage Notes (Signed)
Per [facility]: Pt to ED from home c/o worsening SOB - has hx of CHF, A-Fib. Reports his breathing became worse around 10:15 this morning. Takes 60mg  Lasix bid. Denies chest pain or dizziness. Respirations equal, but shallow and labored on arrival. Breath sounds diminished lower lobes, some expiratory wheezing and rales auscultated.

## 2018-02-13 NOTE — ED Notes (Signed)
Patient complains of pain related to chronic back issue. EDP aware.

## 2018-02-13 NOTE — ED Notes (Signed)
Respiratory at bedside. Patient placed on BiPAP.

## 2018-02-13 NOTE — ED Notes (Signed)
MD at bedside. 

## 2018-02-13 NOTE — ED Notes (Signed)
Patient weaned off BIPAP by respiratory. No distress or shortness of breath observed.

## 2018-02-13 NOTE — ED Provider Notes (Signed)
[name] EMERGENCY DEPARTMENT Provider Note   CSN: [id number] Arrival date & time: 02/13/18  1212     History   Chief Complaint Chief Complaint  Patient presents with  . Shortness of Breath    HPI Ryan Macdonald is a 84 y.o. male.  Patient presents by EMS for evaluation of shortness of breath.  Initially during transport he was being maintained on nasal cannula oxygen, for a slightly low oxygen saturation at 93% on room air.  Report from paramedics is that he suddenly worsen as he was being wheeled into the examination room.  He was recently treated for atrial fibrillation and congestive heart failure.  Patient states he took 60 mg of Lasix this morning.  He is unable to give additional history because of significant shortness of breath.  Level 5 caveat-unable to give history  HPI  Past Medical History:  Diagnosis Date  . Cat scratch fever    removed mass on right side neck  . Cataracts, bilateral   . CHF (congestive heart failure) (HCC)   . Diabetes mellitus without complication (HCC)    TYPE 2  . High cholesterol   . Hypertension   . Kidney stones   . Morbid obesity (HCC)   . Nonischemic cardiomyopathy (HCC)    a. ? 2009 Cath in [place] - nl cors per pt;  b. 09/2013 Echo: EF 25-30%, sev diff HK.  . Osteoarthritis   . PAF (paroxysmal atrial fibrillation) (HCC)    a. post-op hip in 2014 - prev on xarelto.  . Renal insufficiency     Patient Active Problem List   Diagnosis Date Noted  . Acute respiratory failure with hypoxia (HCC) 09/27/2017  . CHF exacerbation (HCC) 09/27/2017  . Renal insufficiency   . Acute on chronic systolic CHF (congestive heart failure) (HCC) 07/08/2017  . Left knee pain 07/08/2017  . Acute bronchitis 05/10/2017  . Acute congestive heart failure (HCC) 04/14/2017  . Elevated troponin 04/14/2017  . Lactic acidosis   . Acute on chronic systolic heart failure, NYHA class 2 (HCC) 02/03/2017  . Diabetes mellitus type 2 in obese  (HCC) 02/03/2017  . CAP (community acquired pneumonia) 02/03/2017  . Lobar pneumonia (HCC)   . Diabetes mellitus with complication (HCC)   . Acute on chronic respiratory failure with hypoxia (HCC)   . Acute on chronic combined systolic (congestive) and diastolic (congestive) heart failure (HCC) 05/19/2016  . Depression 05/19/2016  . Gout 05/19/2016  . 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 NSVT (nonsustained ventricular tachycardia) (HCC)   . Chronic renal insufficiency, stage III (moderate) (HCC)   . Pulmonary embolism (HCC)   . Acute respiratory failure (HCC)   . Osteoarthritis of left hip 10/25/2013  . Atrial fibrillation (HCC) 10/25/2013  . S/P hip replacement 10/24/2013  . Nonischemic cardiomyopathy (HCC)   . Obesity (BMI 30-39.9)   . Essential hypertension   . Hyperlipidemia     Past Surgical History:  Procedure Laterality Date  . CARDIAC CATHETERIZATION  1980's  . CIRCUMCISION  1940's  . CYSTOSCOPY WITH URETHRAL DILATATION  10/23/2013  . CYSTOSCOPY WITH URETHRAL DILATATION N/A 10/23/2013   Procedure: CYSTOSCOPY WITH URETHRAL DILATATION;  Surgeon: Kathi Ludwig, MD;  Location: [facility] OR;  Service: Urology;  Laterality: N/A;  . INCISION AND DRAINAGE OF WOUND Right 1980's   "cat scratch" (10/23/2013)  . INGUINAL HERNIA REPAIR Right 1950's  . KIDNEY STONE SURGERY Right 1970's; 1983   "twice"  . TONSILLECTOMY AND ADENOIDECTOMY  1940's  .  TOTAL HIP ARTHROPLASTY Left 10/23/2013  . TOTAL HIP ARTHROPLASTY Left 10/23/2013   Procedure: TOTAL HIP ARTHROPLASTY;  Surgeon: Valeria Batman, MD;  Location: [facility] OR;  Service: Orthopedics;  Laterality: Left;       Home Medications    Prior to Admission medications   Medication Sig Start Date End Date Taking? Authorizing Provider  allopurinol (ZYLOPRIM) 100 MG tablet Take 200 mg by mouth daily.   Yes [provider]  apixaban (ELIQUIS) 5 MG TABS tablet Take 1 tablet (5 mg total) by mouth 2  (two) times daily. 02/06/17  Yes Amin, Loura Halt, MD  carvedilol (COREG) 12.5 MG tablet Take 0.5 tablets (6.25 mg total) by mouth 2 (two) times daily with a meal. 04/15/17  Yes Osvaldo Shipper, MD  cyanocobalamin 1000 MCG tablet Take 1,000 mcg by mouth daily.    Yes [provider]  diclofenac sodium (VOLTAREN) 1 % GEL Apply 4 g topically 4 (four) times daily. 07/10/17  Yes Valentino Nose, MD  finasteride (PROSCAR) 5 MG tablet Take 5 mg by mouth daily.   Yes [provider]  furosemide (LASIX) 20 MG tablet Take 3 tablets (60 mg total) by mouth 2 (two) times daily. 07/10/17  Yes Valentino Nose, MD  glipiZIDE (GLUCOTROL) 5 MG tablet Take 5 mg by mouth daily before breakfast.   Yes [provider]  hydrALAZINE (APRESOLINE) 25 MG tablet Take 25 mg by mouth 3 (three) times daily.   Yes [provider]  HYDROcodone-acetaminophen (NORCO/VICODIN) 5-325 MG tablet Take 2 tablets by mouth every 4 (four) hours as needed. Patient taking differently: Take 1 tablet by mouth 2 (two) times daily.  04/09/16  Yes Isa Rankin, MD  ipratropium-albuterol (DUONEB) 0.5-2.5 (3) MG/3ML SOLN Take 3 mLs by nebulization every 4 (four) hours as needed. 05/14/17  Yes Pearson Grippe, MD  isosorbide dinitrate (DILATRATE-SR) 40 MG CR capsule Take 40 mg by mouth 3 (three) times daily.   Yes [provider]  ketoconazole (NIZORAL) 2 % shampoo Apply 1 application topically 2 (two) times a week.   Yes [provider]  MAGNESIUM PO Take 0.5 tablets by mouth 2 (two) times daily.   Yes [provider]  potassium chloride (K-DUR) 10 MEQ tablet Take 2 tablets (20 mEq total) by mouth daily. 07/10/17 02/13/18 Yes Valentino Nose, MD  sertraline (ZOLOFT) 25 MG tablet Take 50 mg by mouth daily as needed (for anxiety). Takes 1-2 times per week   Yes [provider]  spironolactone (ALDACTONE) 25 MG tablet Take 25 mg by mouth daily.   Yes [provider]  tamsulosin  (FLOMAX) 0.4 MG CAPS capsule Take 0.4 mg by mouth at bedtime.   Yes [provider]    Family History Family History  Problem Relation Age of Onset  . Heart disease Mother   . Heart Problems Maternal Grandmother   . Other Unknown        negative for premature CAD    Social History Social History   Tobacco Use  . Smoking status: Former Smoker    Packs/day: 1.00    Years: 15.00    Pack years: 15.00    Types: Cigarettes, Cigars  . Smokeless tobacco: Former Neurosurgeon  . Tobacco comment: quit smoking ~ 50 yr ago  Substance Use Topics  . Alcohol use: No    Alcohol/week: 1.2 oz    Types: 2 Cans of beer per week    Comment: rare beer.  . Drug use: No  Allergies   Lisinopril and Xarelto [rivaroxaban]   Review of Systems Review of Systems  Unable to perform ROS: Acuity of condition     Physical Exam Updated Vital Signs BP 134/74   Pulse 71   Temp 97.7 F (36.5 C) (Axillary)   Resp 19   SpO2 97%   Physical Exam  Constitutional: He is oriented to person, place, and time. He appears well-developed. He appears distressed (Uncomfortable).  Overweight elderly male.  HENT:  Head: Normocephalic and atraumatic.  Right Ear: External ear normal.  Left Ear: External ear normal.  Eyes: Conjunctivae and EOM are normal. Pupils are equal, round, and reactive to light.  Neck: Normal range of motion and phonation normal. Neck supple.  Cardiovascular: Normal rate, regular rhythm and normal heart sounds.  Pulmonary/Chest: No stridor. He is in respiratory distress. He exhibits no bony tenderness.  Increased work of breathing with tachypnea.  Poor air movement bilaterally.  Respirations are shallow.  Abdominal: Soft. There is no tenderness.  Musculoskeletal: Normal range of motion. He exhibits edema (To 3+ lower extremity, bilaterally.).  Neurological: He is alert and oriented to person, place, and time. No cranial nerve deficit or sensory deficit. He exhibits normal muscle  tone. Coordination normal.  Skin: Skin is warm, dry and intact.  Psychiatric: He has a normal mood and affect. His behavior is normal.  Nursing note and vitals reviewed.    ED Treatments / Results  Labs (all labs ordered are listed, but only abnormal results are displayed) Labs Reviewed  BRAIN NATRIURETIC PEPTIDE - Abnormal; Notable for the following components:      Result Value   B Natriuretic Peptide 381.7 (*)    All other components within normal limits  CBC WITH DIFFERENTIAL/PLATELET - Abnormal; Notable for the following components:   RBC 4.03 (*)    Hemoglobin 12.0 (*)    HCT 37.4 (*)    All other components within normal limits  D-DIMER, QUANTITATIVE (NOT AT [facility]) - Abnormal; Notable for the following components:   D-Dimer, Quant 0.98 (*)    All other components within normal limits  I-STAT CHEM 8, ED - Abnormal; Notable for the following components:   Potassium 5.4 (*)    BUN 22 (*)    Glucose, Bld 141 (*)    Calcium, Ion 1.05 (*)    Hemoglobin 12.9 (*)    HCT 38.0 (*)    All other components within normal limits  I-STAT TROPONIN, ED  I-STAT ARTERIAL BLOOD GAS, ED    EKG  EKG Interpretation None        Radiology Ct Angio Chest Pe W/cm &/or Wo Cm  Result Date: 02/13/2018 CLINICAL DATA:  Shortness of breath. Positive D-dimer. PE suspected with intermediate probability. New interstitial edema. EXAM: CT ANGIOGRAPHY CHEST WITH CONTRAST TECHNIQUE: Multidetector CT imaging of the chest was performed using the standard protocol during bolus administration of intravenous contrast. Multiplanar CT image reconstructions and MIPs were obtained to evaluate the vascular anatomy. CONTRAST:  ISOVUE-370 IOPAMIDOL (ISOVUE-370) INJECTION 76% COMPARISON:  One-view chest x-ray 02/13/2018 FINDINGS: Cardiovascular: Heart is enlarged. AICD extends into the right ventricle. Coronary artery calcifications are present in the LAD and left circumflex. No pericardial effusion is present.  Atherosclerotic calcifications are present in the aorta without aneurysm. Great vessel origins are unremarkable. Pulmonary artery opacification is excellent. No focal filling defects are present to suggest pulmonary emboli. Mediastinum/Nodes: No significant mediastinal, hilar, or axillary adenopathy is present. The thoracic inlet is within normal limits. The esophagus  is mildly dilated throughout the mediastinum without a mass lesion or obstruction. Lungs/Pleura: Diffuse ground-glass attenuation is compatible with edema. Small effusions are present, right greater than left. No significant airspace consolidation is present. There is no nodule or mass lesion. There is no pneumothorax. Upper Abdomen: Limited imaging of the abdomen is unremarkable. Musculoskeletal: The 12 rib-bearing thoracic type vertebral bodies are present. A vacuum disc is present at T9-10. The vertebral body heights are maintained. No focal lytic or blastic lesions are present. The ribs are unremarkable. Review of the MIP images confirms the above findings. IMPRESSION: 1. Cardiomegaly with mild edema and bilateral pleural effusions compatible with congestive heart failure. 2. No pulmonary embolus. 3.  Aortic Atherosclerosis (ICD10-I70.0). 4. Coronary artery disease. Electronically Signed   By: Marin Roberts M.D.   On: 02/13/2018 16:46   Dg Chest Portable 1 View  Result Date: 02/13/2018 CLINICAL DATA:  Respiratory distress. EXAM: PORTABLE CHEST 1 VIEW COMPARISON:  Chest x-ray dated February 04, 2018. FINDINGS: Unchanged single lead left chest wall pacemaker. Stable cardiomegaly. New pulmonary vascular congestion and diffuse interstitial thickening. Hazy opacity over the left mid to lower lung. No focal consolidation, pleural effusion, or pneumothorax. No acute osseous abnormality. IMPRESSION: New pulmonary interstitial edema. Hazy opacity of the left mid to lower lung may reflect asymmetric alveolar edema. Electronically Signed   By: Obie Dredge M.D.   On: 02/13/2018 12:42    Procedures .Critical Care Performed by: Mancel Bale, MD Authorized by: Mancel Bale, MD   Critical care provider statement:    Critical care time (minutes):  50   Critical care start time:  02/13/2018 12:20 PM   Critical care end time:  02/13/2018 5:15 PM   Critical care time was exclusive of:  Separately billable procedures and treating other patients   Critical care was time spent personally by me on the following activities:  Blood draw for specimens, development of treatment plan with patient or surrogate, discussions with consultants, evaluation of patient's response to treatment, examination of patient, obtaining history from patient or surrogate, ordering and performing treatments and interventions, ordering and review of laboratory studies, pulse oximetry, re-evaluation of patient's condition, review of old charts and ordering and review of radiographic studies   (including critical care time)  Medications Ordered in ED Medications  furosemide (LASIX) injection 80 mg (80 mg Intravenous Given 02/13/18 1333)  iopamidol (ISOVUE-370) 76 % injection (100 mLs  Contrast Given 02/13/18 1553)  HYDROcodone-acetaminophen (NORCO/VICODIN) 5-325 MG per tablet 1 tablet (1 tablet Oral Given 02/13/18 1629)     Initial Impression / Assessment and Plan / ED Course  I have reviewed the triage vital signs and the nursing notes.  Pertinent labs & imaging results that were available during my care of the patient were reviewed by me and considered in my medical decision making (see chart for details).  Clinical Course as of Feb 13 1718  Mon Feb 13, 2018  1227 BiPAP ordered for respiratory distress.  [EW]  1309 He is able to speak in full sentences.On BiPAP, now, he states he feels much better.  [EW]  1310 Consistent with pulmonary edema. IV Lasix ordered. DG Chest Portable 1 View [EW]  1426 At this time the patient feels much better since starting on BiPAP.   D-dimer elevated, CT angiogram ordered to evaluate for pulmonary embolus.  [EW]  1718 This time, he has voided 500 cc of urine, and states he feels much better.  Will try off BiPAP, to see if he  can be maintained that way.  [EW]    Clinical Course User Index [EW] Mancel Bale, MD     Patient Vitals for the past 24 hrs:  BP Temp Temp src Pulse Resp SpO2  02/13/18 1700 134/74 - - 71 19 97 %  02/13/18 1630 (!) 121/94 - - 74 19 99 %  02/13/18 1615 126/83 - - 72 - 97 %  02/13/18 1610 122/80 - - 80 20 100 %  02/13/18 1545 123/79 - - 75 - 98 %  02/13/18 1515 (!) 145/78 - - 84 - 99 %  02/13/18 1502 124/80 - - 82 - 100 %  02/13/18 1400 118/79 - - - 20 100 %  02/13/18 1345 123/65 - - 78 (!) 21 100 %  02/13/18 1330 111/61 - - 79 (!) 25 100 %  02/13/18 1315 103/71 - - 65 18 98 %  02/13/18 1245 128/77 - - - (!) 26 98 %  02/13/18 1230 (!) 145/89 - - 89 (!) 27 100 %  02/13/18 1218 (!) 151/90 97.7 F (36.5 C) Axillary 93 (!) 28 100 %     MDM-shortness of breath with clinical findings consistent with acute congestive heart failure.  CT angiogram of the chest ordered to evaluate for PE with concern for tachypnea and elevated d-dimer.  Potassium elevated on i-STAT testing.  Will likely need to be repeated.  Patient is stabilizing and improving on BiPAP, and weaning from BiPAP accomplished in the emergency department.  Doubt PE, pneumonia, ACS, serious bacterial infection or impending vascular collapse.  Patient will require admission for further management and stabilization.   Nursing Notes Reviewed/ Care Coordinated Applicable Imaging Reviewed Interpretation of Laboratory Data incorporated into ED treatment   Case discussed with hospitalist to arrange admission     Final Clinical Impressions(s) / ED Diagnoses   Final diagnoses:  Acute on chronic congestive heart failure, unspecified heart failure type [facility])    ED Discharge Orders    None       Mancel Bale, MD 02/16/18 1146

## 2018-02-13 NOTE — H&P (Signed)
Date: 02/13/2018               Patient Name:  Ryan Macdonald MRN: [id number]  DOB: 23-Oct-1934 Age / Sex: 85 y.o., male   [facility]P: [facility]         Medical Service: Internal Medicine Teaching Service         Attending Physician: Dr. Mikey Bussing, Marthenia Rolling, DO    First Contact: Dr. Evelene Croon  Pager: [phone]  Second Contact: Dr. Samuella Cota  Pager: [phone]       After Hours (After 5p/  First Contact Pager: [phone]  weekends / holidays): Second Contact Pager: [phone]   Chief Complaint: Not feeling normal   History of Present Illness:  Ryan Macdonald is an 84 yo male with history of NICM with EF 15% s/p PPM, Afib on Eliquis, non-insulin dependent T2DM, HTN, and depression who presented to the ED due to not feeling normal. Wife present at bedside. Patient states he was in his usual state of health until this morning when he started feeling not like his usual self. He is unable to describe this sensation and denies shortness of breath, fatigue, or tiredness. He took his home Lasix 60 mg and called EMS. He weights himself every morning and states his dry weight is 248-251 lbs. He weight 249 this morning and noticed his weight continues to increase up to 252 lbs later in the morning. He reports compliance with a low sodium diet, but wife states he eats high amounts of pork rinds. He follows up with cardiology at the [facility]. He denies recent illness, HA, fever, chills, SOB, cough, abdominal pain, N/V, orthopnea, PND, urinary symptoms, and changes in bowel movements. He does have bilateral extremity edema that is chronic and unchanged. At baseline, he is able to perform ADLs and go to the grocery store without difficulties.   ED course: Patient was afebrile and hemodynamically stable on presentation. He was on NRB on presentation and was escalated to BiPAP which he was on for ~ 2 hours. There is no documented hypoxia. Workup remarkable for BNP 381.7, Hgb 12, K 5.4, Cr 1.05, and D-dimer 0.98. ABG  7.39/41.5/103/25.6 (unclear if patient on BiPAP when this was obtained). EKG showed rate controlled Afib and no signs of acute ischemia. I-stat troponin negative. CXR consistent with pulmonary edema. CTA chest negative for PE. He received IV Lasix 80 mg x1, and Norco 5 mg x1.    Review of Systems: A complete ROS was negative except as per HPI.   Meds:  Current Meds  Medication Sig  . allopurinol (ZYLOPRIM) 100 MG tablet Take 200 mg by mouth daily.  Marland Kitchen apixaban (ELIQUIS) 5 MG TABS tablet Take 1 tablet (5 mg total) by mouth 2 (two) times daily.  . carvedilol (COREG) 12.5 MG tablet Take 0.5 tablets (6.25 mg total) by mouth 2 (two) times daily with a meal.  . cyanocobalamin 1000 MCG tablet Take 1,000 mcg by mouth daily.   . diclofenac sodium (VOLTAREN) 1 % GEL Apply 4 g topically 4 (four) times daily.  . finasteride (PROSCAR) 5 MG tablet Take 5 mg by mouth daily.  . furosemide (LASIX) 20 MG tablet Take 3 tablets (60 mg total) by mouth 2 (two) times daily.  Marland Kitchen glipiZIDE (GLUCOTROL) 5 MG tablet Take 5 mg by mouth daily before breakfast.  . hydrALAZINE (APRESOLINE) 25 MG tablet Take 25 mg by mouth 3 (three) times daily.  Marland Kitchen HYDROcodone-acetaminophen (NORCO/VICODIN) 5-325 MG tablet Take 2 tablets by mouth  every 4 (four) hours as needed. (Patient taking differently: Take 1 tablet by mouth 2 (two) times daily. )  . ipratropium-albuterol (DUONEB) 0.5-2.5 (3) MG/3ML SOLN Take 3 mLs by nebulization every 4 (four) hours as needed.  . isosorbide dinitrate (DILATRATE-SR) 40 MG CR capsule Take 40 mg by mouth 3 (three) times daily.  Marland Kitchen ketoconazole (NIZORAL) 2 % shampoo Apply 1 application topically 2 (two) times a week.  Marland Kitchen MAGNESIUM PO Take 0.5 tablets by mouth 2 (two) times daily.  . potassium chloride (K-DUR) 10 MEQ tablet Take 2 tablets (20 mEq total) by mouth daily.  . sertraline (ZOLOFT) 25 MG tablet Take 50 mg by mouth daily as needed (for anxiety). Takes 1-2 times per week  . spironolactone (ALDACTONE) 25  MG tablet Take 25 mg by mouth daily.  . tamsulosin (FLOMAX) 0.4 MG CAPS capsule Take 0.4 mg by mouth at bedtime.    Allergies: Allergies as of 02/13/2018 - Review Complete 02/13/2018  Allergen Reaction Noted  . Lisinopril Swelling 10/11/2013  . Xarelto [rivaroxaban]  02/13/2018   Past Medical History:  Diagnosis Date  . Cat scratch fever    removed mass on right side neck  . Cataracts, bilateral   . CHF (congestive heart failure) (HCC)   . Diabetes mellitus without complication (HCC)    TYPE 2  . High cholesterol   . Hypertension   . Kidney stones   . Morbid obesity (HCC)   . Nonischemic cardiomyopathy (HCC)    a. ? 2009 Cath in [place] - nl cors per pt;  b. 09/2013 Echo: EF 25-30%, sev diff HK.  . Osteoarthritis   . PAF (paroxysmal atrial fibrillation) (HCC)    a. post-op hip in 2014 - prev on xarelto.  . Renal insufficiency     Family History:  Family History  Problem Relation Age of Onset  . Heart disease Mother   . Heart Problems Maternal Grandmother   . Other Unknown        negative for premature CAD    Social History:  Social History   Tobacco Use  . Smoking status: Former Smoker    Packs/day: 1.00    Years: 15.00    Pack years: 15.00    Types: Cigarettes, Cigars  . Smokeless tobacco: Former Neurosurgeon  . Tobacco comment: quit smoking ~ 50 yr ago  Substance Use Topics  . Alcohol use: No    Alcohol/week: 1.2 oz    Types: 2 Cans of beer per week    Comment: rare beer.  . Drug use: No    Physical Exam: Blood pressure (!) 164/92, pulse 82, temperature 97.7 F (36.5 C), temperature source Axillary, resp. rate 19, SpO2 92 %.  Physical Exam  Constitutional: He is oriented to person, place, and time. He appears well-developed. No distress.  Patient is obese and appears younger than stated age  Neck: Normal range of motion. Neck supple. JVD (JVD up to jaw) present.  Cardiovascular: Normal rate, regular rhythm, normal heart sounds and intact distal pulses. Exam  reveals no gallop and no friction rub.  No murmur heard. Pulmonary/Chest: Breath sounds normal. No respiratory distress. He has no wheezes. He has no rales.  Appears comfortable on room air and able to speak in full sentences   Abdominal: Soft. Bowel sounds are normal. He exhibits no distension. There is no tenderness.  Musculoskeletal: He exhibits edema (Trace bilateral LE edema ).  Neurological: He is alert and oriented to person, place, and time.  Skin: Skin is  warm. No rash noted. No erythema.    EKG: personally reviewed my interpretation is Afib, rate controlled, no signs of acute ischemia   CXR: personally reviewed my interpretation is diffuse interstitial edema, no pleural effusions   Assessment & Plan by Problem: Principal Problem:   CHF exacerbation (HCC) Active Problems:   Essential hypertension   Atrial fibrillation (HCC)   Depression   Diabetes mellitus with complication (HCC)  Ryan Macdonald is an 84 yo male with history of NICM with EF 15% s/p PPM, Afib on Eliquis, non-insulin dependent T2DM, HTN, and depression who presented to the ED due to not feeling normal.   # Acute on chronic HFrEF: Likely secondary to dietary indiscretion. Patient presents with complaint of not feeling normal but denies dyspnea, orthopnea, and worsening LE swelling. BNP 300s. EKG without signs of ischemia and I-stat troponin negative. Initially presented on NRB and transitioned to BiPAP x 2 hours. On room air and comfortable whe- Continue home Coreg as above n seen. JVD and trace bilateral LE edema (chronic and unchanged) on exam. Lung CTAB. Last TTE on 3/10 with worsening EF of 15%, diffuse hypokinesis, G1DD, and moderately reduced RV contraction. Will continue IV diuresis as below.  - IV Lasix 60 mg BID  - Strict I/Os and daily weights  - Continue home Coreg 6.25 mg BID  - Continue home isosorbide dinitrate 40 mg TID  - Continue home spironolactone 25 mg BID   # Non-insulin dependent T2DM: A1c  6.3 on 02/03/2017. On glipizide at home.  - SSI-M + CBG monitoring   # Afib: Currently rate controlled.  - HF medications as above  - Continue home Eliquis 5 mg BID   # HTN: Currently normotensive  - Holding home hydralazine  - Continue HF meds as above   # Depression   - Continue home Zoloft 50 mg QD   F: none  E: monitoring  N: HH/CM diet   VTE ppx: home Eliquis   Code status: Full code, confirmed on admission   Dispo: Admit patient to Observation with expected length of stay less than 2 midnights.  SignedBurna Cash, MD 02/13/2018, 9:33 PM  Pager: [phone]

## 2018-02-14 ENCOUNTER — Other Ambulatory Visit: Payer: Self-pay

## 2018-02-14 ENCOUNTER — Encounter (HOSPITAL_COMMUNITY): Payer: Self-pay | Admitting: General Practice

## 2018-02-14 LAB — CBC
HEMATOCRIT: 33.3 % — AB (ref 39.0–52.0)
HEMOGLOBIN: 10.6 g/dL — AB (ref 13.0–17.0)
MCH: 29.4 pg (ref 26.0–34.0)
MCHC: 31.8 g/dL (ref 30.0–36.0)
MCV: 92.5 fL (ref 78.0–100.0)
PLATELETS: 228 10*3/uL (ref 150–400)
RBC: 3.6 MIL/uL — AB (ref 4.22–5.81)
RDW: 15.1 % (ref 11.5–15.5)
WBC: 7.7 10*3/uL (ref 4.0–10.5)

## 2018-02-14 LAB — BASIC METABOLIC PANEL
ANION GAP: 13 (ref 5–15)
BUN: 17 mg/dL (ref 6–20)
CALCIUM: 8.8 mg/dL — AB (ref 8.9–10.3)
CHLORIDE: 101 mmol/L (ref 101–111)
CO2: 27 mmol/L (ref 22–32)
Creatinine, Ser: 1.2 mg/dL (ref 0.61–1.24)
GFR calc Af Amer: >60 mL/min (ref >60)
GFR calc non Af Amer: 55 mL/min — ABNORMAL LOW (ref >60)
GLUCOSE: 106 mg/dL — AB (ref 65–99)
POTASSIUM: 3.1 mmol/L — AB (ref 3.5–5.1)
Sodium: 141 mmol/L (ref 135–145)

## 2018-02-14 LAB — GLUCOSE, CAPILLARY
Glucose-Capillary: 114 mg/dL — ABNORMAL HIGH (ref 65–99)
Glucose-Capillary: 143 mg/dL — ABNORMAL HIGH (ref 65–99)
Glucose-Capillary: 117 mg/dL — ABNORMAL HIGH (ref 65–99)
Glucose-Capillary: 132 mg/dL — ABNORMAL HIGH (ref 65–99)

## 2018-02-14 LAB — MAGNESIUM: Magnesium: 1.6 mg/dL — ABNORMAL LOW (ref 1.7–2.4)

## 2018-02-14 MED ORDER — POTASSIUM CHLORIDE CRYS ER 20 MEQ PO TBCR
40.0000 meq | EXTENDED_RELEASE_TABLET | Freq: Two times a day (BID) | ORAL | Status: AC
  Administered 2018-02-14 (×2): 40 meq via ORAL
  Filled 2018-02-14 (×2): qty 2

## 2018-02-14 MED ORDER — MAGNESIUM SULFATE 2 GM/50ML IV SOLN
2.0000 g | Freq: Once | INTRAVENOUS | Status: AC
  Administered 2018-02-14: 2 g via INTRAVENOUS
  Filled 2018-02-14: qty 50

## 2018-02-14 NOTE — Evaluation (Signed)
Physical Therapy Evaluation Patient Details Name: Ryan Macdonald MRN: [id number] DOB: 1934/04/03 Today's Date: 02/14/2018   History of Present Illness  84yo male presenting with general malaise, weight gain; imaging negative for PE. Diagnosed with acute on chronic HFrEF. PMH CHF, DM, HTN, obesity, cardiomyopathy, OA, PAF  Clinical Impression   Patient received in bed, very pleasant and cheerful, willing to participate in PT this afternoon. He is generally Mod(I) to I in functional bed mobility, functional transfers, and gait; able to ambulate approximately 119ft with no device, "toddling" gait pattern, no significant unsteadiness noted and patient reports he really is generally at his baseline and does not feel in need of skilled PT services. SpO2 ranged from 90-93% on room air during gait. RN aware of patient status and PT recommendation for mobility tech. He is not currently in need of skilled PT services at this time or moving forward due to being at his baseline level of function, but will benefit from participating in activity with mobility tech. Please feel free to reorder PT if patient status or mobility changes; PT signing off for now, thank you for the referral.     Follow Up Recommendations No PT follow up;Other (comment)(mobility tech program )    Equipment Recommendations  None recommended by PT    Recommendations for Other Services       Precautions / Restrictions Precautions Precautions: None Restrictions Weight Bearing Restrictions: No      Mobility  Bed Mobility Overal bed mobility: Modified Independent             General bed mobility comments: extended time   Transfers Overall transfer level: Modified independent                  Ambulation/Gait Ambulation/Gait assistance: Modified independent (Device/Increase time) Ambulation Distance (Feet): 100 Feet Assistive device: None Gait Pattern/deviations: Step-through pattern;Decreased step length -  right;Decreased step length - left;Decreased stride length     General Gait Details: gait generally appears WFL, has mild "toddling" pattern but is steady with no significant loss of balance noted   Stairs            Wheelchair Mobility    Modified Rankin (Stroke Patients Only)       Balance Overall balance assessment: No apparent balance deficits (not formally assessed)                                           Pertinent Vitals/Pain Pain Assessment: No/denies pain    Home Living Family/patient expects to be discharged to:: Private residence Living Arrangements: Spouse/significant other Available Help at Discharge: Family;Available 24 hours/day Type of Home: House Home Access: Stairs to enter Entrance Stairs-Rails: Can reach both Entrance Stairs-Number of Steps: 3 in garage, 4 in front  Home Layout: One level Home Equipment: Walker - 2 wheels;Cane - single point      Prior Function Level of Independence: Independent with assistive device(s)         Comments: Uses [employer] PRN. Drives, cooks, cleans. No falls reported.      Hand Dominance        Extremity/Trunk Assessment        Lower Extremity Assessment Lower Extremity Assessment: Overall WFL for tasks assessed    Cervical / Trunk Assessment Cervical / Trunk Assessment: Normal  Communication   Communication: No difficulties  Cognition Arousal/Alertness: Awake/alert Behavior During Therapy: WFL for  tasks assessed/performed Overall Cognitive Status: Within Functional Limits for tasks assessed                                        General Comments      Exercises     Assessment/Plan    PT Assessment Patent does not need any further PT services  PT Problem List         PT Treatment Interventions      PT Goals (Current goals can be found in the Care Plan section)  Acute Rehab PT Goals Patient Stated Goal: to go home  PT Goal Formulation: With  patient Time For Goal Achievement: 02/28/18 Potential to Achieve Goals: Good    Frequency     Barriers to discharge        Co-evaluation               AM-PAC PT "6 Clicks" Daily Activity  Outcome Measure Difficulty turning over in bed (including adjusting bedclothes, sheets and blankets)?: None Difficulty moving from lying on back to sitting on the side of the bed? : None Difficulty sitting down on and standing up from a chair with arms (e.g., wheelchair, bedside commode, etc,.)?: None Help needed moving to and from a bed to chair (including a wheelchair)?: None Help needed walking in hospital room?: None Help needed climbing 3-5 steps with a railing? : A Little 6 Click Score: 23    End of Session Equipment Utilized During Treatment: Gait belt Activity Tolerance: Patient tolerated treatment well Patient left: in bed;with call bell/phone within reach;with family/visitor present Nurse Communication: Mobility status;Other (comment)(mobility tech recommendation ) PT Visit Diagnosis: Muscle weakness (generalized) (M62.81)    Time: 1610-9604 PT Time Calculation (min) (ACUTE ONLY): 15 min   Charges:   PT Evaluation $PT Eval Low Complexity: 1 Low     PT G Codes:        Nedra Hai PT, DPT, CBIS  Supplemental Physical Therapist [facility]   Pager [phone]

## 2018-02-14 NOTE — Progress Notes (Signed)
   Subjective:  Ryan Macdonald was seen resting comfortably in bed this morning. He reports feeling better overall. Feels his shortness of breath and lower extremity swelling have improved. Discussed avoidance of dietary intake of salt, particularly pork rinds. He reports that he really does not consume that much of the pork rinds. Discussed will continue to diurese.  Patient verbalized understanding and is in agreement with plan. All questions answered.   Objective:  Vital signs in last 24 hours: Vitals:   02/13/18 2110 02/14/18 0532 02/14/18 0842 02/14/18 1125  BP: 113/68 (!) 99/49 (!) 105/56 101/73  Pulse: 83 91 82 83  Resp: (!) 22 20 18    Temp: 98 F (36.7 C) 98.4 F (36.9 C) 97.7 F (36.5 C) 97.7 F (36.5 C)  TempSrc: Oral Oral Oral Oral  SpO2: 91% 94% 94% 97%  Weight: 254 lb 6.4 oz (115.4 kg) 253 lb (114.8 kg)     Physical Exam  Constitutional: He is oriented to person, place, and time.  Elderly male, obese, appears younger than stated age.  Lying in bed in no acute distress.  Neck: JVD (JVD 5 cm above clavicle) present.  Cardiovascular: Exam reveals no gallop and no friction rub.  No murmur heard. Irregularly irregular rhythm  Pulmonary/Chest: Effort normal and breath sounds normal. No respiratory distress. He has no wheezes. He has no rales.  Abdominal: Soft. Bowel sounds are normal. He exhibits no distension. There is no tenderness.  Musculoskeletal: He exhibits edema (Trace pitting edema on LLE and 1+ pitting edema in RLE).  Neurological: He is alert and oriented to person, place, and time.    Assessment/Plan:  Principal Problem:   CHF exacerbation (HCC) Active Problems:   Essential hypertension   Atrial fibrillation (HCC)   Depression   Diabetes mellitus with complication (HCC)  # Acute on chronic HFrEF: Likely secondary to dietary indiscretion. Diuresing well with IV Lasix 60 mg BID.  JVD and bilateral lower extremity edema improving.  Continues to oxygenate  well on room air.  UOP 1.9L and net -1.5L. Weight is down 1 lbs.  We will continue diuresis as below with close monitoring of vital signs as soft blood pressures throughout the day today. - IV Lasix 60 mg BID  - Strict I/Os and daily weights  - Continue home Coreg 6.25 mg BID  - Continue home isosorbide dinitrate 40 mg TID  - Continue home spironolactone 25 mg BID   # Non-insulin dependent T2DM: A1c 6.3 on 02/03/2017. On glipizide at home. CBGs at goal.  - SSI-M + CBG monitoring   # Afib: Currently rate controlled.  - HF medications as above  - Continue home Eliquis 5 mg BID   # HTN: Soft BP in the setting of diuresis. Will continue to monitor.  - Holding home hydralazine  - Continue HF meds as above   # Depression   - Continue home Zoloft 50 mg QD     Dispo: Anticipated discharge 1-2 days.    Burna Cash, MD 02/14/2018, 3:52 PM Pager: Demetrius Charity [phone]

## 2018-02-14 NOTE — Plan of Care (Signed)
  Nutrition: Adequate nutrition will be maintained 02/14/2018 1101 - Progressing by Leonette Monarch, RN   Education: Ability to verbalize understanding of medication therapies will improve 02/14/2018 1101 - Progressing by Leonette Monarch, RN   Education: Ability to demonstrate management of disease process will improve 02/14/2018 1101 - Progressing by Leonette Monarch, RN

## 2018-02-15 LAB — GLUCOSE, CAPILLARY
GLUCOSE-CAPILLARY: 121 mg/dL — AB (ref 65–99)
GLUCOSE-CAPILLARY: 147 mg/dL — AB (ref 65–99)
Glucose-Capillary: 129 mg/dL — ABNORMAL HIGH (ref 65–99)
Glucose-Capillary: 120 mg/dL — ABNORMAL HIGH (ref 65–99)

## 2018-02-15 LAB — BASIC METABOLIC PANEL
Anion gap: 8 (ref 5–15)
BUN: 18 mg/dL (ref 6–20)
CALCIUM: 9.1 mg/dL (ref 8.9–10.3)
CO2: 30 mmol/L (ref 22–32)
CREATININE: 1.19 mg/dL (ref 0.61–1.24)
Chloride: 102 mmol/L (ref 101–111)
GFR calc Af Amer: >60 mL/min (ref >60)
GFR calc non Af Amer: 55 mL/min — ABNORMAL LOW (ref >60)
Glucose, Bld: 114 mg/dL — ABNORMAL HIGH (ref 65–99)
Potassium: 4.1 mmol/L (ref 3.5–5.1)
SODIUM: 140 mmol/L (ref 135–145)

## 2018-02-15 LAB — MAGNESIUM: MAGNESIUM: 1.8 mg/dL (ref 1.7–2.4)

## 2018-02-15 MED ORDER — MAGNESIUM SULFATE 2 GM/50ML IV SOLN
2.0000 g | Freq: Once | INTRAVENOUS | Status: AC
  Administered 2018-02-15: 2 g via INTRAVENOUS
  Filled 2018-02-15: qty 50

## 2018-02-15 NOTE — Progress Notes (Addendum)
   Subjective:  Patient reports he was told his ICD fired overnight, but states he was sleeping and did not feel anything. This morning patient reports he is doing well and his breathing is at baseline. Discussed with patient will consult the heart failure team for further recommendations and to establish care with HF here in [place]. Patient verbalized understanding and is in agreement with plan. All questions answered.   Objective:  Vital signs in last 24 hours: Vitals:   02/14/18 1125 02/14/18 2003 02/15/18 0022 02/15/18 0617  BP: 101/73 (!) 102/57 (!) 116/52 110/65  Pulse: 83 81 77 89  Resp:  18 18 18   Temp: 97.7 F (36.5 C) 97.7 F (36.5 C) 98.7 F (37.1 C) 98.6 F (37 C)  TempSrc: Oral Oral Oral Oral  SpO2: 97% 96% 96% 97%  Weight:    251 lb 4.8 oz (114 kg)   Physical Exam  Constitutional: He is oriented to person, place, and time.  Obese male lying in bed in no acute distress   Neck: No JVD present.  Cardiovascular: Normal rate. Exam reveals no gallop.  No murmur heard. Irregularly irregular rhythm   Pulmonary/Chest: Effort normal and breath sounds normal. No respiratory distress. He has no wheezes. He has no rales.  Abdominal: Soft. Bowel sounds are normal. He exhibits no distension. There is no tenderness.  Musculoskeletal: He exhibits edema (1+ LE pitting edema bilaterally unchanged from yesterday ).  Neurological: He is alert and oriented to person, place, and time.   Assessment/Plan:  Principal Problem:   CHF exacerbation (HCC) Active Problems:   Essential hypertension   Atrial fibrillation (HCC)   Depression   Diabetes mellitus with complication (HCC)  # Acute on chronic HFrEF: Likely secondary to dietary indiscretion. Diuresing well with IV Lasix 60 mg BID. Respiratory status remains stable. UOP 2L with net -1L. Weight down 2 lbs. Will continue IV diuresis today as below. Will also consult HF for further recommendations and to establish care. He follows up  with cardiology at [facility], but would prefer to establish with a HF provider in [place].  - IV Lasix 60 mg BID  - Strict I/Os and daily weights  - Continue home Coreg 6.25 mg BID  - Continue home isosorbide dinitrate 40 mg TID  - Continue home spironolactone 25 mg BID   # Non-insulin dependent T2DM: A1c 6.3 on 02/03/2017. On glipizide at home. CBGs at goal.  - SSI-M + CBG monitoring   # Afib: Currently rate controlled.  - HF medications as above  - Continue home Eliquis 5 mg BID   # HTN: normotensive  - Holding home hydralazine  - Continue HF meds as above   # Depression  - Continue home Zoloft 50 mg QD    Dispo: Anticipated discharge in approximately 1-2 day(s).   Burna Cash, MD 02/15/2018, 9:26 AM Pager: [phone]

## 2018-02-15 NOTE — Progress Notes (Signed)
Pt is alert oriented very talkative, and pleasant, Education officer, environmental. Sitting up and eating with no distress. Magnesium running in left forarm and left hand flushed with lasix. Belongs in reach

## 2018-02-15 NOTE — Plan of Care (Signed)
  Progressing Activity: Risk for activity intolerance will decrease 02/15/2018 2122 - Progressing by Carolanne Grumbling, RN Elimination: Will not experience complications related to urinary retention 02/15/2018 2122 - Progressing by Carolanne Grumbling, RN Safety: Ability to remain free from injury will improve 02/15/2018 2122 - Progressing by Carolanne Grumbling, RN Activity: Capacity to carry out activities will improve 02/15/2018 2122 - Progressing by Carolanne Grumbling, RN

## 2018-02-16 ENCOUNTER — Inpatient Hospital Stay (HOSPITAL_COMMUNITY): Payer: Medicare Other

## 2018-02-16 DIAGNOSIS — I480 Paroxysmal atrial fibrillation: Secondary | ICD-10-CM

## 2018-02-16 DIAGNOSIS — I5043 Acute on chronic combined systolic (congestive) and diastolic (congestive) heart failure: Secondary | ICD-10-CM

## 2018-02-16 DIAGNOSIS — I481 Persistent atrial fibrillation: Secondary | ICD-10-CM

## 2018-02-16 DIAGNOSIS — I340 Nonrheumatic mitral (valve) insufficiency: Secondary | ICD-10-CM

## 2018-02-16 LAB — ECHOCARDIOGRAM COMPLETE: Weight: 3996.8 oz

## 2018-02-16 LAB — BASIC METABOLIC PANEL
Anion gap: 12 (ref 5–15)
BUN: 20 mg/dL (ref 6–20)
CALCIUM: 9.1 mg/dL (ref 8.9–10.3)
CO2: 28 mmol/L (ref 22–32)
Chloride: 98 mmol/L — ABNORMAL LOW (ref 101–111)
Creatinine, Ser: 1.29 mg/dL — ABNORMAL HIGH (ref 0.61–1.24)
GFR calc non Af Amer: 50 mL/min — ABNORMAL LOW (ref >60)
GFR, EST AFRICAN AMERICAN: 58 mL/min — AB (ref >60)
Glucose, Bld: 148 mg/dL — ABNORMAL HIGH (ref 65–99)
Potassium: 3.5 mmol/L (ref 3.5–5.1)
SODIUM: 138 mmol/L (ref 135–145)

## 2018-02-16 LAB — MAGNESIUM: MAGNESIUM: 1.8 mg/dL (ref 1.7–2.4)

## 2018-02-16 LAB — GLUCOSE, CAPILLARY
GLUCOSE-CAPILLARY: 131 mg/dL — AB (ref 65–99)
Glucose-Capillary: 136 mg/dL — ABNORMAL HIGH (ref 65–99)

## 2018-02-16 MED ORDER — DIGOXIN 125 MCG PO TABS: 0.1250 mg | ORAL_TABLET | Freq: Every day | ORAL | 0 refills | Status: DC

## 2018-02-16 MED ORDER — AMIODARONE HCL 200 MG PO TABS: 200.0000 mg | ORAL_TABLET | Freq: Two times a day (BID) | ORAL | 0 refills | Status: DC

## 2018-02-16 MED ORDER — TORSEMIDE 20 MG PO TABS: 40.0000 mg | ORAL_TABLET | Freq: Two times a day (BID) | ORAL | 0 refills | Status: DC

## 2018-02-16 MED ORDER — DIGOXIN 125 MCG PO TABS
0.1250 mg | ORAL_TABLET | Freq: Every day | ORAL | Status: DC
  Administered 2018-02-16: 0.125 mg via ORAL
  Filled 2018-02-16: qty 1

## 2018-02-16 MED ORDER — HYDRALAZINE HCL 25 MG PO TABS: 12.5000 mg | ORAL_TABLET | Freq: Three times a day (TID) | ORAL | 0 refills | Status: DC

## 2018-02-16 MED ORDER — PERFLUTREN LIPID MICROSPHERE
1.0000 mL | INTRAVENOUS | Status: AC | PRN
  Administered 2018-02-16: 2 mL via INTRAVENOUS
  Filled 2018-02-16: qty 10

## 2018-02-16 MED ORDER — AMIODARONE HCL 200 MG PO TABS
200.0000 mg | ORAL_TABLET | Freq: Two times a day (BID) | ORAL | Status: DC
  Administered 2018-02-16: 200 mg via ORAL
  Filled 2018-02-16: qty 1

## 2018-02-16 MED ORDER — HYDRALAZINE HCL 25 MG PO TABS
12.5000 mg | ORAL_TABLET | Freq: Three times a day (TID) | ORAL | Status: DC
  Administered 2018-02-16: 12.5 mg via ORAL
  Filled 2018-02-16: qty 1

## 2018-02-16 MED ORDER — POTASSIUM CHLORIDE CRYS ER 20 MEQ PO TBCR
40.0000 meq | EXTENDED_RELEASE_TABLET | Freq: Once | ORAL | Status: AC
  Administered 2018-02-16: 40 meq via ORAL
  Filled 2018-02-16: qty 2

## 2018-02-16 MED ORDER — MAGNESIUM SULFATE 2 GM/50ML IV SOLN
2.0000 g | Freq: Once | INTRAVENOUS | Status: AC
  Administered 2018-02-16: 2 g via INTRAVENOUS
  Filled 2018-02-16: qty 50

## 2018-02-16 MED ORDER — TORSEMIDE 20 MG PO TABS
40.0000 mg | ORAL_TABLET | Freq: Two times a day (BID) | ORAL | Status: DC
  Administered 2018-02-16: 40 mg via ORAL
  Filled 2018-02-16: qty 2

## 2018-02-16 NOTE — Discharge Summary (Signed)
Name: Ryan Macdonald MRN: [id number] DOB: February 14, 1934 84 y.o. PCP: [facility]  Date of Admission: 02/13/2018 12:12 PM Date of Discharge: 02/16/2018 Attending Physician: Gust Rung, DO  Discharge Diagnosis: 1. CHF exacerbation  2. Atrial fibrillation  3. Essential HTN  4. T2DM   Principal Problem:   CHF exacerbation (HCC) Active Problems:   Essential hypertension   Atrial fibrillation (HCC)   Acute on chronic combined systolic and diastolic CHF (congestive heart failure) (HCC)   Depression   Diabetes mellitus with complication [facility])   Discharge Medications:  Allergies as of 02/16/2018      Reactions   Lisinopril Swelling   Facial swelling   Xarelto [rivaroxaban]    bleeding      Medication List    STOP taking these medications   furosemide 20 MG tablet Commonly known as:  LASIX     TAKE these medications   allopurinol 100 MG tablet Commonly known as:  ZYLOPRIM Take 200 mg by mouth daily.   amiodarone 200 MG tablet Commonly known as:  PACERONE Take 1 tablet (200 mg total) by mouth 2 (two) times daily.   apixaban 5 MG Tabs tablet Commonly known as:  ELIQUIS Take 1 tablet (5 mg total) by mouth 2 (two) times daily.   carvedilol 12.5 MG tablet Commonly known as:  COREG Take 0.5 tablets (6.25 mg total) by mouth 2 (two) times daily with a meal.   cyanocobalamin 1000 MCG tablet Take 1,000 mcg by mouth daily.   diclofenac sodium 1 % Gel Commonly known as:  VOLTAREN Apply 4 g topically 4 (four) times daily.   digoxin 0.125 MG tablet Commonly known as:  LANOXIN Take 1 tablet (0.125 mg total) by mouth daily.   finasteride 5 MG tablet Commonly known as:  PROSCAR Take 5 mg by mouth daily.   glipiZIDE 5 MG tablet Commonly known as:  GLUCOTROL Take 5 mg by mouth daily before breakfast.   hydrALAZINE 25 MG tablet Commonly known as:  APRESOLINE Take 0.5 tablets (12.5 mg total) by mouth every 8 (eight) hours. What changed:    how much  to take  when to take this   HYDROcodone-acetaminophen 5-325 MG tablet Commonly known as:  NORCO/VICODIN Take 2 tablets by mouth every 4 (four) hours as needed. What changed:    how much to take  when to take this   ipratropium-albuterol 0.5-2.5 (3) MG/3ML Soln Commonly known as:  DUONEB Take 3 mLs by nebulization every 4 (four) hours as needed.   isosorbide dinitrate 40 MG CR capsule Commonly known as:  DILATRATE-SR Take 40 mg by mouth 3 (three) times daily.   ketoconazole 2 % shampoo Commonly known as:  NIZORAL Apply 1 application topically 2 (two) times a week.   MAGNESIUM PO Take 0.5 tablets by mouth 2 (two) times daily.   potassium chloride 10 MEQ tablet Commonly known as:  K-DUR Take 2 tablets (20 mEq total) by mouth daily.   sertraline 25 MG tablet Commonly known as:  ZOLOFT Take 50 mg by mouth daily as needed (for anxiety). Takes 1-2 times per week   spironolactone 25 MG tablet Commonly known as:  ALDACTONE Take 25 mg by mouth daily.   tamsulosin 0.4 MG Caps capsule Commonly known as:  FLOMAX Take 0.4 mg by mouth at bedtime.   torsemide 20 MG tablet Commonly known as:  DEMADEX Take 2 tablets (40 mg total) by mouth 2 (two) times daily.        Disposition  and follow-up:   [name] was discharged from [facility] in Stable condition.  At the hospital follow up visit please address:  1.  Please assess for ongoing respiratory symptoms as well as signs of volume overload on exam.  Please review medication list with patient and ensure he is currently taking torsemide 40 mg daily not Lasix. Please assess compliance with digoxin and amiodarone.  Please ensure patient has been seen or has a follow-up appointment scheduled with Dr. Shirlee Latch in the heart failure clinic.  Continue to counsel patient on importance of low-sodium diet. Please assess BP patient may need increase in hydralazine dose to home dose of 25 mg TID.   2.  Labs / imaging  needed at time of follow-up: CBC to check Hgb and BMP to check potassium and renal function   3.  Pending labs/ test needing follow-up: None   Follow-up Appointments: Follow-up Information    [facility] Follow up on 02/27/2018.   Specialty:  Cardiology Why:  12 Noon.  Located in [place]. Parking in ER parking lot (blue awning "[employer]ed forces training and education officer"), and underneath [facility] on [facility]. (Come through construction entrance, Garage Code: 1100, elevator to 1st floor).  Check-in glass lobby. Contact information: [address] [id number] mc [place] [address] [phone]          Hospital Course by problem list: Principal Problem:   CHF exacerbation [facility]) Active Problems:   Essential hypertension   Atrial fibrillation (HCC)   Acute on chronic combined systolic and diastolic CHF (congestive heart failure) (HCC)   Depression   Diabetes mellitus with complication (HCC)   1. CHF exacerbation: Patient presented with signs and symptoms of heart failure including dyspnea, JVD, and lower extremity edema.  He required BiPAP on presentation.  His respiratory status markedly improved after 1 dose of IV Lasix in the ED and he was admitted for further diuresis with IV Lasix.  This episode was thought to be secondary to dietary indiscretion as his wife reports he constantly eats pork rind (patient denies this).  Troponin negative and EKG without signs of acute ischemia.  [facility] and he is on appropriate medication for his heart failure.  However he has had 5 recent admissions for HF exacerbations in the past 3 months and HF consulted in order to maximize resources as it would be easier for patient to follow-up with a HF specialist in the area.  He diuresed well with IV Lasix and was transitioned to torsemide 40 mg daily per HF recommendations. He was discharged on home dose of Coreg,  hydralazine, isordil, and spironolactone listed above. Digoxin 0.125 mg QD was added to his regimen. His weight was 249 lbs on day of discharge.  Repeat TTE on day of discharge with improved EF of 30% (report below).  He will be followed closely in heart failure clinic with Dr. Shirlee Latch.  Of note, patient reported he was told he is ICD fire during HD#2.  His eyes was interrogated and did not show firing of ICD.  2. Atrial fibrillation: Patient reported a history of persistent atrial fibrillation that is rate controlled with Coreg.  He is on Eliquis 5 mg BID for anticoagulation therapy.  However, [employer] ICD device interrogation by HF team reveal his last episode of atrial fibrillation was in 11/2017.  He has never been cardioverted in the past.  Heart failure team recommended adding  amiodarone to maintain normal sinus rhythm.  He was discharged with amiodarone 200 mg BID x 10 days followed by amiodarone 200 mg QD. He will continue to follow closely with Dr. Shirlee Latch at heart failure clinic.  3. Essential HTN: Patient's antihypertensive regimen was held during his admission in the setting of aggressive IV diuresis and soft blood pressures.  Home antihypertensive medications were resumed on day of discharge.  Hydralazine was started at a dose of 12.5 mg TID.  May increase to home dose of 25 mg TID at follow up if indicated.   4. T2DM: Patient has a history of well-controlled diabetes on oral hypoglycemic agents.  He was managed with sliding scale insulin and CBGs remained at goal during this admission.  Home medications were resumed on day of discharge.   Discharge Vitals:   BP 109/66 (BP Location: Right Arm)   Pulse 72   Temp 98 F (36.7 C) (Oral)   Resp 18   Wt 249 lb 12.8 oz (113.3 kg)   SpO2 98%   BMI 39.12 kg/m   Pertinent Labs, Studies, and Procedures:  CBC Latest Ref Rng & Units 02/14/2018 02/13/2018 02/13/2018  WBC 4.0 - 10.5 K/uL 7.7 - 9.4  Hemoglobin 13.0 - 17.0 g/dL 10.6(L) 12.9(L) 12.0(L)    Hematocrit 39.0 - 52.0 % 33.3(L) 38.0(L) 37.4(L)  Platelets 150 - 400 K/uL 228 - 261   BMP Latest Ref Rng & Units 02/16/2018 02/15/2018 02/14/2018  Glucose 65 - 99 mg/dL 161(W) 960(A) 540(J)  BUN 6 - 20 mg/dL 20 18 17   Creatinine 0.61 - 1.24 mg/dL 8.11(B) 1.47 8.29  Sodium 135 - 145 mmol/L 138 140 141  Potassium 3.5 - 5.1 mmol/L 3.5 4.1 3.1(L)  Chloride 101 - 111 mmol/L 98(L) 102 101  CO2 22 - 32 mmol/L 28 30 27   Calcium 8.9 - 10.3 mg/dL 9.1 9.1 5.6(O)   BNP 130.8 D-dimer 0.98  CXR 3/18: FINDINGS: Unchanged single lead left chest wall pacemaker. Stable cardiomegaly. New pulmonary vascular congestion and diffuse interstitial thickening. Hazy opacity over the left mid to lower lung. No focal consolidation, pleural effusion, or pneumothorax. No acute osseous abnormality.  CTA chest 3/18: IMPRESSION: 1. Cardiomegaly with mild edema and bilateral pleural effusions compatible with congestive heart failure. 2. No pulmonary embolus. 3.  Aortic Atherosclerosis (ICD10-I70.0). 4. Coronary artery disease.  TTE 3/21: Study Conclusions - Left ventricle: The cavity size was severely dilated. Wall   thickness was increased in a pattern of mild LVH. Systolic   function was moderately to severely reduced. The estimated   ejection fraction was in the range of 30% to 35%. - Aortic valve: There was trivial regurgitation. - Mitral valve: There was mild to moderate regurgitation. - Right ventricle: The cavity size was mildly dilated. - Poor acoustic windows limit study.   Discharge Instructions: Discharge Instructions    (HEART FAILURE PATIENTS) Call MD:  Anytime you have any of the following symptoms: 1) 3 pound weight gain in 24 hours or 5 pounds in 1 week 2) shortness of breath, with or without a dry hacking cough 3) swelling in the hands, feet or stomach 4) if you have to sleep on extra pillows at night in order to breathe.   Complete by:  As directed    Diet - low sodium heart healthy    Complete by:  As directed    Discharge instructions   Complete by:  As directed    [name], Ryan Macdonald were admitted to the hospital due  to a heart failure exacerbation. We made several changes to your medications.   1- You will stop taking the fluid pill Lasix.  2- You will start taking a new fluid pill called Torsemide. You will take 40 mg (2 tablets) two times a day.  3- You will also start taking Digoxin 0.125 mg (1 tablet) every day. This will make your heart beat stronger.  4- You will also start taking Amiodarone 200 mg (1 tablet) two times a day.  5- We changed the dose of your home Hydralazine (blood pressure medication). You were taking 25 mg three times a day. Stop this dose.  You will now start taking hydralazine 12.5 mg (half a tablet) three times a day.   [facility] I also gave you a copy of the prescription for you to take to your nearest pharmacy as you will need to start taking all of these medications tomorrow. Please let us know if you have any issues getting these medications.   It will very important that you follow up with the heart failure doctors in the heart failure clinic here at [facility]. They have scheduled an appointment for you on 4/1 at noon.   Continue to weigh yourself at home every morning and keep track of your weight. If you notice a 3lbs increase in your weight in 1 day or a 5lbs increase in 1 week, call your heart failure doctor as you might need adjustments in your fluid pills.   Make sure to schedule a follow up appointment with your primary care doctor as well.   Increase activity slowly   Complete by:  As directed       Signed: Burna Cash, MD 02/18/2018, 5:54 PM   Pager: [phone]

## 2018-02-16 NOTE — Progress Notes (Signed)
Reviewed all discharge instructions with patient and his wife.  Both stated understanding.  No voiced complaints.

## 2018-02-16 NOTE — Consult Note (Addendum)
Advanced Heart Failure Team Consult Note   Primary Physician:  Dr Ryan Macdonald [facility] PCP-Cardiologist:  Dr Ryan Macdonald [facility]  Reason for Consultation: A/ C Systolic Heart Failure   HPI:    Ryan Macdonald is seen today for evaluation of heart failure at the request of Dr Ryan Macdonald.   Ryan Macdonald is an 84 year old with a history of NICM, [employer] ICD, A Fib, DMII, HTN, and depression admitted with increased dyspnea and fatigue. He has had a sleep study in the past and this was negative.   Prior to admit his weight went up from 249 to 251 pounds.  He is compliant for with his medications and has regular follow up at the [facility]. He has not had chest pain. Says he follows a low salt diet. Tries to limit fluid intake. Rarely drinks alcohol.   He was evaluated in the ED 11/06/18, 11/16/2018, 12/24/2017, 02/04/2018 with mild volume overload. He was given IV lasix and discharged. Says he   Presented to [facility] with increased dyspnea. CXR with pulmonary edema. CT of chest was negative for PE. Pertinent admission labs included: K 5.4 , creatinine 1.2, hgb 12.9, BNP 381, troponin 0.0.3. He has been diuresing with IV lasix. Weight has gone down 5 pounds.   He was told his ICD fired last night. Denies CP/SOB.  .   Echo  01/2017  Mildly dilated left ventricle with mild LVH and LVEF   approximately 15-20%. There is diffuse hypokinesis with akinesis   of the mid to basal inferior wall. Probable grade 1 diastolic   dysfunction. Mildly calcified mitral annulus with mild mitral   regurgitation. Sclerotic aortic valve without stenosis.   Moderately reduced right ventricular contraction, device wire   present. Trivial tricuspid regurgitation.  ECHO 04/2016  Left ventricle: The cavity size was moderately dilated. Wall   thickness was increased in a pattern of mild LVH. Systolic   function was severely reduced. The estimated ejection fraction   was in the range of 25% to 30%. Diffuse hypokinesis.  Doppler   parameters are consistent with both elevated ventricular   end-diastolic filling pressure and elevated left atrial filling   pressure. - Left atrium: The atrium was mildly dilated. - Atrial septum: No defect or patent foramen ovale was identified.   Review of Systems: [y] = yes, [ ]  = no   General: Weight gain [Y ]; Weight loss [ ] ; Anorexia [ ] ; Fatigue [ ] ; Fever [ ] ; Chills [ ] ; Weakness [ ]   Cardiac: Chest pain/pressure [ ] ; Resting SOB [ ] ; Exertional SOB [ ] ; Orthopnea [ ] ; Pedal Edema [ ] ; Palpitations [ ] ; Syncope [ ] ; Presyncope [ ] ; Paroxysmal nocturnal dyspnea[ ]   Pulmonary: Cough [ ] ; Wheezing[ ] ; Hemoptysis[ ] ; Sputum [ ] ; Snoring [ ]   GI: Vomiting[ ] ; Dysphagia[ ] ; Melena[ ] ; Hematochezia [ ] ; Heartburn[ ] ; Abdominal pain [ ] ; Constipation [ ] ; Diarrhea [ ] ; BRBPR [ ]   GU: Hematuria[ ] ; Dysuria [ ] ; Nocturia[ ]   Vascular: Pain in legs with walking [ ] ; Pain in feet with lying flat [ ] ; Non-healing sores [ ] ; Stroke [ ] ; TIA [ ] ; Slurred speech [ ] ;  Neuro: Headaches[ ] ; Vertigo[ ] ; Seizures[ ] ; Paresthesias[ ] ;Blurred vision [ ] ; Diplopia [ ] ; Vision changes [ ]   Ortho/Skin: Arthritis [ ] ; Joint pain [Y ]; Muscle pain [ ] ; Joint swelling [ ] ; Back Pain [ ] ; Rash [ ]   Psych: Depression[ ] ; Anxiety[ ]   Heme:  Bleeding problems [ ] ; Clotting disorders [ ] ; Anemia [ ]   Endocrine: Diabetes [Y ]; Thyroid dysfunction[ ]   Home Medications Prior to Admission medications   Medication Sig Start Date End Date Taking? Authorizing Provider  allopurinol (ZYLOPRIM) 100 MG tablet Take 200 mg by mouth daily.   Yes [provider]  apixaban (ELIQUIS) 5 MG TABS tablet Take 1 tablet (5 mg total) by mouth 2 (two) times daily. 02/06/17  Yes Amin, Loura Halt, MD  carvedilol (COREG) 12.5 MG tablet Take 0.5 tablets (6.25 mg total) by mouth 2 (two) times daily with a meal. 04/15/17  Yes Osvaldo Shipper, MD  cyanocobalamin 1000 MCG tablet Take 1,000 mcg by mouth daily.    Yes  [provider]  diclofenac sodium (VOLTAREN) 1 % GEL Apply 4 g topically 4 (four) times daily. 07/10/17  Yes Valentino Nose, MD  finasteride (PROSCAR) 5 MG tablet Take 5 mg by mouth daily.   Yes [provider]  furosemide (LASIX) 20 MG tablet Take 3 tablets (60 mg total) by mouth 2 (two) times daily. 07/10/17  Yes Valentino Nose, MD  glipiZIDE (GLUCOTROL) 5 MG tablet Take 5 mg by mouth daily before breakfast.   Yes [provider]  hydrALAZINE (APRESOLINE) 25 MG tablet Take 25 mg by mouth 3 (three) times daily.   Yes [provider]  HYDROcodone-acetaminophen (NORCO/VICODIN) 5-325 MG tablet Take 2 tablets by mouth every 4 (four) hours as needed. Patient taking differently: Take 1 tablet by mouth 2 (two) times daily.  04/09/16  Yes Isa Rankin, MD  ipratropium-albuterol (DUONEB) 0.5-2.5 (3) MG/3ML SOLN Take 3 mLs by nebulization every 4 (four) hours as needed. 05/14/17  Yes Pearson Grippe, MD  isosorbide dinitrate (DILATRATE-SR) 40 MG CR capsule Take 40 mg by mouth 3 (three) times daily.   Yes [provider]  ketoconazole (NIZORAL) 2 % shampoo Apply 1 application topically 2 (two) times a week.   Yes [provider]  MAGNESIUM PO Take 0.5 tablets by mouth 2 (two) times daily.   Yes [provider]  potassium chloride (K-DUR) 10 MEQ tablet Take 2 tablets (20 mEq total) by mouth daily. 07/10/17 02/13/18 Yes Valentino Nose, MD  sertraline (ZOLOFT) 25 MG tablet Take 50 mg by mouth daily as needed (for anxiety). Takes 1-2 times per week   Yes [provider]  spironolactone (ALDACTONE) 25 MG tablet Take 25 mg by mouth daily.   Yes [provider]  tamsulosin (FLOMAX) 0.4 MG CAPS capsule Take 0.4 mg by mouth at bedtime.   Yes [provider]    Past Medical History: Past Medical History:  Diagnosis Date  . Cat scratch fever    removed mass on right side neck  . Cataracts, bilateral   . CHF (congestive heart  failure) (HCC)   . Diabetes mellitus without complication (HCC)    TYPE 2  . High cholesterol   . Hypertension   . Kidney stones   . Morbid obesity (HCC)   . Nonischemic cardiomyopathy (HCC)    a. ? 2009 Cath in [place] - nl cors per pt;  b. 09/2013 Echo: EF 25-30%, sev diff HK.  . Osteoarthritis   . PAF (paroxysmal atrial fibrillation) (HCC)    a. post-op hip in 2014 - prev on xarelto.  . Renal insufficiency     Past Surgical History: Past Surgical History:  Procedure Laterality Date  . CARDIAC CATHETERIZATION  1980's  . CIRCUMCISION  1940's  . CYSTOSCOPY WITH URETHRAL DILATATION  10/23/2013  . CYSTOSCOPY WITH URETHRAL DILATATION N/A 10/23/2013   Procedure: CYSTOSCOPY WITH URETHRAL DILATATION;  Surgeon: Kathi Ludwig, MD;  Location: [facility] OR;  Service: Urology;  Laterality: N/A;  . INCISION AND DRAINAGE OF WOUND Right 1980's   "cat scratch" (10/23/2013)  . INGUINAL HERNIA REPAIR Right 1950's  . KIDNEY STONE SURGERY Right 1970's; 1983   "twice"  . TONSILLECTOMY AND ADENOIDECTOMY  1940's  . TOTAL HIP ARTHROPLASTY Left 10/23/2013   Procedure: TOTAL HIP ARTHROPLASTY;  Surgeon: Valeria Batman, MD;  Location: [facility] OR;  Service: Orthopedics;  Laterality: Left;    Family History: Family History  Problem Relation Age of Onset  . Heart disease Mother   . Heart Problems Maternal Grandmother   . Other Unknown        negative for premature CAD    Social History: Social History   Socioeconomic History  . Marital status: Married    Spouse name: Not on file  . Number of children: Not on file  . Years of education: Not on file  . Highest education level: Not on file  Occupational History  . Occupation: Retired    Comment: Former Patent examiner  . Financial resource strain: Not on file  . Food insecurity:    Worry: Not on file    Inability: Not on file  . Transportation needs:    Medical: Not on file    Non-medical: Not on file  Tobacco Use  . Smoking status: Former  Smoker    Packs/day: 1.00    Years: 15.00    Pack years: 15.00    Types: Cigarettes, Cigars  . Smokeless tobacco: Former Neurosurgeon  . Tobacco comment: quit smoking ~ 50 yr ago  Substance and Sexual Activity  . Alcohol use: No    Alcohol/week: 1.2 oz    Types: 2 Cans of beer per week    Comment: rare beer.  . Drug use: No  . Sexual activity: Yes  Lifestyle  . Physical activity:    Days per week: Not on file    Minutes per session: Not on file  . Stress: Not on file  Relationships  . Social connections:    Talks on phone: Not on file    Gets together: Not on file    Attends religious service: Not on file    Active member of club or organization: Not on file    Attends meetings of clubs or organizations: Not on file    Relationship status: Not on file  Other Topics Concern  . Not on file  Social History Narrative   Lives in [place] with wife. Moved from [place] (2013). Does not routinely exercise.    Allergies:  Allergies  Allergen Reactions  . Lisinopril Swelling    Facial swelling  . Xarelto [Rivaroxaban]     bleeding    Objective:    Vital Signs:   Temp:  [97.8 F (36.6 C)-98 F (36.7 C)] 98 F (36.7 C) (03/21 0546) Pulse Rate:  [72-89] 72 (03/21 0546) Resp:  [18] 18 (03/21 0546) BP: (104-109)/(57-75) 106/59 (03/21 0546) SpO2:  [93 %-98 %] 98 % (03/21 0546) Weight:  [249 lb 12.8 oz (113.3 kg)] 249 lb 12.8 oz (113.3 kg) (03/21 0546) Last BM Date: 02/15/18  Weight change: Filed Weights   02/14/18 0532 02/15/18 0617 02/16/18 0546  Weight: 253 lb (114.8 kg) 251 lb 4.8 oz (114 kg) 249 lb 12.8 oz (113.3 kg)    Intake/Output:   Intake/Output  Summary (Last 24 hours) at 02/16/2018 0935 Last data filed at 02/16/2018 0900 Gross per 24 hour  Intake 1190 ml  Output 1950 ml  Net -760 ml      Physical Exam    General:  Well appearing. No resp difficulty. In bed.  HEENT: normal Neck: supple. JVP 7-8 . Carotids 2+ bilat; no bruits. No lymphadenopathy or  thyromegaly appreciated. Cor: PMI nondisplaced. Irregular rate & rhythm. No rubs, gallops or murmurs. Lungs: clear Abdomen: soft, nontender, nondistended. No hepatosplenomegaly. No bruits or masses. Good bowel sounds. Extremities: no cyanosis, clubbing, rash, edema Neuro: alert & orientedx3, cranial nerves grossly intact. moves all 4 extremities w/o difficulty. Affect pleasant   Telemetry  Af igb with PVCs and one episode of NSVT.  EKG    A fib 78 personally reviewed   Labs   Basic Metabolic Panel: Recent Labs  Lab 02/13/18 1235 02/14/18 0431 02/15/18 0516 02/16/18 0347  NA 142 141 140 138  K 5.4* 3.1* 4.1 3.5  CL 105 101 102 98*  CO2  --  27 30 28   GLUCOSE 141* 106* 114* 148*  BUN 22* 17 18 20   CREATININE 1.20 1.20 1.19 1.29*  CALCIUM  --  8.8* 9.1 9.1  MG  --  1.6* 1.8 1.8    Liver Function Tests: No results for input(s): AST, ALT, ALKPHOS, BILITOT, PROT, ALBUMIN in the last 168 hours. No results for input(s): LIPASE, AMYLASE in the last 168 hours. No results for input(s): AMMONIA in the last 168 hours.  CBC: Recent Labs  Lab 02/13/18 1118 02/13/18 1235 02/14/18 0431  WBC 9.4  --  7.7  NEUTROABS 6.2  --   --   HGB 12.0* 12.9* 10.6*  HCT 37.4* 38.0* 33.3*  MCV 92.8  --  92.5  PLT 261  --  228    Cardiac Enzymes: No results for input(s): CKTOTAL, CKMB, CKMBINDEX, TROPONINI in the last 168 hours.  BNP: BNP (last 3 results) Recent Labs    12/24/17 1211 02/04/18 0927 02/13/18 1224  BNP 266.5* 368.6* 381.7*    ProBNP (last 3 results) No results for input(s): PROBNP in the last 8760 hours.   CBG: Recent Labs  Lab 02/15/18 0729 02/15/18 1147 02/15/18 1635 02/15/18 2105 02/16/18 0741  GLUCAP 129* 121* 147* 120* 131*    Coagulation Studies: No results for input(s): LABPROT, INR in the last 72 hours.   Imaging    No results found.   Medications:     Current Medications: . allopurinol  200 mg Oral Daily  . apixaban  5 mg Oral BID    . carvedilol  6.25 mg Oral BID WC  . finasteride  5 mg Oral Daily  . furosemide  60 mg Intravenous BID  . insulin aspart  0-15 Units Subcutaneous TID WC  . isosorbide dinitrate  40 mg Oral TID  . sertraline  50 mg Oral QHS  . spironolactone  25 mg Oral Daily  . tamsulosin  0.4 mg Oral QHS  . cyanocobalamin  1,000 mcg Oral Daily     Infusions:     Patient Profile   Ryan Fonder is an 84 year old with a history of NICM, Biotronic ICD, PAF, DMII, HTN, and depression admitted with increased dyspnea and fatigue. He has had a sleep study in the past and this was negative.   He had 5 visits to [facility] ED since December for volume overload.   Admitted with A/C Systolic Heart Failure    Assessment/Plan  1. A/C Systolic Heart Failure Has had a cath years ago. NICM.  ECHO 2018 EF 15%.  He has single chamber [employer] ICD. Narrow QRS.  Admitted with class IV symptoms but now improved.  -Mild volume overload. Give one more dose of IV lasix. I will place him on torsemide 40 mg twice a day. Prior to admit he was taking lasix 60 mg twice a day. Renal function stable.  -Continue dose of carvedilol 6.25 mg twice a day. Add digoxin 0.125 mg daily.  -Will not use ace or arni with history of angioedema.  - Restart hydralazine 12.5 mg and continue isordil dinitrate 40 mg three times a day.  -Repeat ECHO  -Consider cardiomems. We will be happy to see him in the HF clinic. He needs to be seen frequently for the next month.   2.  A fib  Looks like he was in A fib on admit.  Device interrogated by [employer] today. Back in A fib today. Last episiode was in January. He has never had cardioversion.  Continue eliquis 5 mg twice a day.   3. DMII On sliding scale per primary team  4. HTN Stable.   5. NSVT Mag 1.8. Give 2 grams mag. Supp K.   Over the last 4 months he has had been to [facility] ED 5 times. I am not sure what has triggered recent decompensation. We will plug him into the clinic and follow  weekly for the next few weeks. He will also benefit from HF Paramedicine.     Device interrogation today did not show ICD fire. Back in Afib around 0200 today.   Medication concerns reviewed with patient and pharmacy team. Barriers identified: None   Length of Stay: 3  Amy Clegg, NP  02/16/2018, 9:35 AM  Advanced Heart Failure Team Pager [phone] (M-F; 7a - 4p)  Please contact [facility] for night-coverage after hours (4p -7a ) and weekends on [email or web address]  Patient seen with NP, agree with the above note.  84 yo with history of nonischemic cardiomyopathy and paroxysmal atrial fibrillation has been seen in the ER multiple times over the last few months with dyspnea/volume overload.  He was admitted 3/18 with acute/chronic systolic CHF and has been diuresed with IV Lasix.  He is feeling better.    He was concerned that his ICD had fired, but interrogation showed runs of atrial fibrillation but no discharge.  He went into atrial fibrillation again early this morning, but actually appears to be in NSR currently (telemetry difficult).   On exam, JVP 8 cm.  Reg S1S2 w/o murmur. Trace ankle edema.   1. Acute on chronic systolic CHF: Nonischemic cardiomyopathy x years, last echo with EF 15-20% in 3/18.  He has a [employer] ICD. He has diuresed well this admission and no longer appears significantly volume overloaded.  - No ACEI or ARNI given history of angioedema on ACEI.  Probably safe to start on ARB at some point in the future.  - Continue current hydralazine and isordil, can probably increase back to 25 mg tid hydralazine at discharge (home dose).  - Agree with adding digoxin 0.125 daily.  - Continue Coreg 6.25 mg bid and spironolactone 25 daily.  - Stop IV lasix, transition to torsemide 40 mg po bid for home.  - Echo today.  2. Atrial fibrillation: Paroxysmal.  Has been in and out of atrial fibrillation this admission.  Would like to keep him in NSR. - Add amiodarone 200 mg bid x 10 days  then 200 mg daily to try to maintain NSR.  - Difficult to tell current rhythm, may be back in NSR.  Will get ECG.  - Continue apixaban.   He should be close to discharge.  Can followup in CHF clinic with me.   [name] 02/16/2018 1:01 PM

## 2018-02-16 NOTE — Progress Notes (Signed)
Patient's fall score is 11, so bed and chair alarm part of protocol.  This was explained to patient and he declined.  He is alert and oriented.  Denies dizziness.  Encouraged to call for assistance if he feels dizzy when he stands and to sit back down and wait. Stated understanding and said he will call if needed.

## 2018-02-16 NOTE — Progress Notes (Signed)
Representative from [employer] performed a interrogation of Ryan Macdonald ICD and confirmed he has not had ICD fire since he was admitted to hospital.  Patient updated.

## 2018-02-16 NOTE — Progress Notes (Signed)
Heart Failure Navigator Consult Note  Presentation: Per Tonye Becket NP: [name] is an 84 year old with a history of NICM, Biotronic ICD, A Fib, DMII, HTN, and depression admitted with increased dyspnea and fatigue. He has had a sleep study in the past and this was negative.   Prior to admit his weight went up from 249 to 251 pounds.  He is compliant for with his medications and has regular follow up at the [facility]. He has not had chest pain. Says he follows a low salt diet. Tries to limit fluid intake. Rarely drinks alcohol.   He was evaluated in the ED 11/06/18, 11/16/2018, 12/24/2017, 02/04/2018 with mild volume overload. He was given IV lasix and discharged.   Presented to [facility] with increased dyspnea. CXR with pulmonary edema. CT of chest was negative for PE. Pertinent admission labs included: K 5.4 , creatinine 1.2, hgb 12.9, BNP 381, troponin 0.0.3. He has been diuresing with IV lasix. Weight has gone down 5 pounds.   He was told his ICD fired last night. Denies CP/SOB.  .  Past Medical History:  Diagnosis Date  . Cat scratch fever    removed mass on right side neck  . Cataracts, bilateral   . CHF (congestive heart failure) (HCC)   . Diabetes mellitus without complication (HCC)    TYPE 2  . High cholesterol   . Hypertension   . Kidney stones   . Morbid obesity (HCC)   . Nonischemic cardiomyopathy (HCC)    a. ? 2009 Cath in [place] - nl cors per pt;  b. 09/2013 Echo: EF 25-30%, sev diff HK.  . Osteoarthritis   . PAF (paroxysmal atrial fibrillation) (HCC)    a. post-op hip in 2014 - prev on xarelto.  . Renal insufficiency     Social History   Socioeconomic History  . Marital status: Married    Spouse name: Not on file  . Number of children: Not on file  . Years of education: Not on file  . Highest education level: Not on file  Occupational History  . Occupation: Retired    Comment: Former Patent examiner  . Financial resource strain: Not on file  . Food  insecurity:    Worry: Not on file    Inability: Not on file  . Transportation needs:    Medical: Not on file    Non-medical: Not on file  Tobacco Use  . Smoking status: Former Smoker    Packs/day: 1.00    Years: 15.00    Pack years: 15.00    Types: Cigarettes, Cigars  . Smokeless tobacco: Former Neurosurgeon  . Tobacco comment: quit smoking ~ 50 yr ago  Substance and Sexual Activity  . Alcohol use: No    Alcohol/week: 1.2 oz    Types: 2 Cans of beer per week    Comment: rare beer.  . Drug use: No  . Sexual activity: Yes  Lifestyle  . Physical activity:    Days per week: Not on file    Minutes per session: Not on file  . Stress: Not on file  Relationships  . Social connections:    Talks on phone: Not on file    Gets together: Not on file    Attends religious service: Not on file    Active member of club or organization: Not on file    Attends meetings of clubs or organizations: Not on file    Relationship status: Not on file  Other  Topics Concern  . Not on file  Social History Narrative   Lives in [place] with wife. Moved from [place] (2013). Does not routinely exercise.    ECHO:Study Conclusions-02/05/18  - Left ventricle: The cavity size was mildly dilated. Wall   thickness was increased in a pattern of mild LVH. Systolic   function was severely reduced. The estimated ejection fraction   was in the range of 15% to 20%. Diffuse hypokinesis. There is   akinesis of the basal-midinferior myocardium. Doppler parameters   are consistent with abnormal left ventricular relaxation (grade 1   diastolic dysfunction). - Aortic valve: Mildly calcified annulus. Trileaflet; mildly   calcified leaflets. Moderate calcification involving the right   coronary and noncoronary cusp. - Mitral valve: Calcified annulus. There was mild regurgitation. - Right ventricle: Pacer wire or catheter noted in right ventricle.   Systolic function was moderately reduced. - Right atrium: Central venous  pressure (est): 8 mm Hg. - Tricuspid valve: There was trivial regurgitation. - Pulmonary arteries: Systolic pressure could not be accurately   estimated. - Pericardium, extracardiac: There was no pericardial effusion.  Impressions:  - Mildly dilated left ventricle with mild LVH and LVEF   approximately 15-20%. There is diffuse hypokinesis with akinesis   of the mid to basal inferior wall. Probable grade 1 diastolic   dysfunction. Mildly calcified mitral annulus with mild mitral   regurgitation. Sclerotic aortic valve without stenosis.   Moderately reduced right ventricular contraction, device wire   present. Trivial tricuspid regurgitation.  BNP    Component Value Date/Time   BNP 381.7 (H) 02/13/2018 1224    ProBNP No results found for: PROBNP   Education Assessment and Provision:  Detailed education and instructions provided on heart failure disease management including the following:  Signs and symptoms of Heart Failure When to call the physician Importance of daily weights Low sodium diet Fluid restriction Medication management Anticipated future follow-up appointments  Patient education given on each of the above topics.  Patient acknowledges understanding and acceptance of all instructions.  I spoke with [name] regarding his current admission. He tells me that he sees cardiologist at the [place], yet comes to [facility] often when in distress.  He says that he has a scale and weighs daily.  I reviewed the importance of daily weights and when to contact the physician.  He also says that he eats low sodium and has for "quite some time".  I reviewed high sodium foods to avoid.  He denies any issues with getting or taking prescribed medications--says he gets them all through the [place].  He will follow in the AHF clinic after discharge.    Education Materials:  "Living Better With Heart Failure" Booklet, Daily Weight Tracker Tool    High Risk Criteria for Readmission and/or Poor  Patient Outcomes:  (Recommend Follow-up with Advanced Heart Failure Clinic)--yes   EF <30%- yes 15-20%   2 or more admissions in 6 months-6/62mo  Difficult social situation- denies  Demonstrates medication noncompliance- denies   Barriers of Care:  Knowledge and compliance  Discharge Planning:   Patient plans to return to home with wife in [place].  He is open to and will benefit from Paramedicine referral.  I have sent all appropriate paperwork via secure email to Paramedic team.

## 2018-02-16 NOTE — Progress Notes (Signed)
Patient goes to the [facility] for primary care and for his medication.  [phone]- follow the prompts #1 [facility], Dr Tereasa Coop; fax # [phone].  Patient stated that if his prescriptions are sent today, they will send him the medication the next day; Attending MD, please put the patient last 4 numbers of his social security number on the prescription for the [facility] to identify the patient; ( [id number]). [name] [facility] [phone]

## 2018-02-16 NOTE — Progress Notes (Signed)
   Subjective:  No acute events overnight.  Patient continues to do well and has no complaints this morning.  Discussed with patient we will wait heart failure recommendations.  Patient verbalized understanding and is in agreement with plan.  All questions answered.  Objective:  Vital signs in last 24 hours: Vitals:   02/15/18 1120 02/15/18 1415 02/15/18 2026 02/16/18 0546  BP: (!) 104/57  109/75 (!) 106/59  Pulse: 78  85 72  Resp: 18  18 18   Temp: 97.8 F (36.6 C)  97.8 F (36.6 C) 98 F (36.7 C)  TempSrc: Oral  Oral Oral  SpO2: 96% 93% 98% 98%  Weight:    249 lb 12.8 oz (113.3 kg)   Physical Exam  Constitutional: He is oriented to person, place, and time. He appears well-developed and well-nourished. No distress.  Obese male that appears younger than stated age resting in bed in no acute distress  Neck: JVD (JVD 5 cm above clavicle ) present.  Cardiovascular: Normal rate. An irregularly irregular rhythm present. Exam reveals no gallop.  No murmur heard. Pulmonary/Chest: Effort normal. No respiratory distress.  Abdominal: Soft. Bowel sounds are normal. He exhibits no distension. There is no tenderness.  Musculoskeletal: He exhibits edema (Trace pitting edema bilaterally).  Neurological: He is alert and oriented to person, place, and time.    Assessment/Plan:  Principal Problem:   CHF exacerbation (HCC) Active Problems:   Essential hypertension   Atrial fibrillation (HCC)   Depression   Diabetes mellitus with complication (HCC)  # Acute on chronic HFrEF: Likely secondary to dietary indiscretion despite his report of compliance with low sodium diet.Diuresing well with IV Lasix 60 mg BID. Respiratory status remains stable.UOP 1.7L and net -500cc. Weight down 2 lbs. Heart failure team consulted given multiple visits to the ED for heart failure exacerbation in a high risk patient with an EF of 15%.  - Heart failure following, appreciate assistance and recommendations -  Strict I/Os and daily weights  - Continue home Coreg 6.25 mg BID + isosorbide dinitrate 40 mg TID + spironolactone 25 mg BID  - Resume home hydralazine 12.5 mg TID  - Start digoxin 0.125 mg daily - IV Lasix 60 IV x1 followed by torsemide 40 mg BID   # Non-insulin dependent T2DM: A1c 6.3 on 02/03/2017. On glipizide at home.CBGs at goal. - SSI-M + CBG mo nitoring   # Afib: Currently rate controlled.  - HF medications as above  - Continue home Eliquis 5 mg BID   # NBV:APOLIDCVUDTH - Resume home hydralazine 12.5 mg TID  - Continue HF meds as above   # Depression  - Continue home Zoloft 50 mg QD   Dispo: Anticipated discharge in approximately 1-2 day(s) pending improvement in volume status and heart failure recommendations.   Burna Cash, MD 02/16/2018, 6:04 AM Pager: [phone]

## 2018-02-16 NOTE — Progress Notes (Signed)
  Echocardiogram 2D Echocardiogram has been performed.  Ryan Macdonald 02/16/2018, 12:37 PM

## 2018-02-23 ENCOUNTER — Telehealth (HOSPITAL_COMMUNITY): Payer: Self-pay

## 2018-02-23 NOTE — Telephone Encounter (Signed)
I called Ryan Macdonald to schedule an appointment. He did not answer his home phone number so I called his mobile phone. He did not answer that number either so I left a voicemail stating my name, purpose for calling and requesting he call me back.

## 2018-02-24 ENCOUNTER — Other Ambulatory Visit (HOSPITAL_COMMUNITY): Payer: Self-pay

## 2018-02-24 NOTE — Progress Notes (Signed)
Paramedicine Encounter    Patient ID: Ryan Macdonald, male    DOB: [birth year], 84 y.o.   MRN: [id number]   Patient Care Team: [facility], [facility] - General (General Practice)  Patient Active Problem List   Diagnosis Date Noted  . Acute respiratory failure with hypoxia (HCC) 09/27/2017  . CHF exacerbation (HCC) 09/27/2017  . Renal insufficiency   . Acute on chronic systolic CHF (congestive heart failure) (HCC) 07/08/2017  . Left knee pain 07/08/2017  . Acute bronchitis 05/10/2017  . Acute congestive heart failure (HCC) 04/14/2017  . Elevated troponin 04/14/2017  . Lactic acidosis   . Acute on chronic systolic heart failure, NYHA class 2 (HCC) 02/03/2017  . Diabetes mellitus type 2 in obese (HCC) 02/03/2017  . CAP (community acquired pneumonia) 02/03/2017  . Lobar pneumonia (HCC)   . Diabetes mellitus with complication (HCC)   . Acute on chronic respiratory failure with hypoxia (HCC)   . Acute on chronic combined systolic and diastolic CHF (congestive heart failure) (HCC) 05/19/2016  . Depression 05/19/2016  . Gout 05/19/2016  . 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 NSVT (nonsustained ventricular tachycardia) (HCC)   . Chronic renal insufficiency, stage III (moderate) (HCC)   . Pulmonary embolism (HCC)   . Acute respiratory failure (HCC)   . Osteoarthritis of left hip 10/25/2013  . Atrial fibrillation (HCC) 10/25/2013  . S/P hip replacement 10/24/2013  . Nonischemic cardiomyopathy (HCC)   . Obesity (BMI 30-39.9)   . Essential hypertension   . Hyperlipidemia     Current Outpatient Medications:  .  allopurinol (ZYLOPRIM) 100 MG tablet, Take 200 mg by mouth daily., Disp: , Rfl:  .  amiodarone (PACERONE) 200 MG tablet, Take 1 tablet (200 mg total) by mouth 2 (two) times daily., Disp: 60 tablet, Rfl: 0 .  apixaban (ELIQUIS) 5 MG TABS tablet, Take 1 tablet (5 mg total) by mouth 2 (two) times daily., Disp: 60 tablet, Rfl: 0 .  carvedilol  (COREG) 12.5 MG tablet, Take 0.5 tablets (6.25 mg total) by mouth 2 (two) times daily with a meal., Disp: 30 tablet, Rfl: 0 .  cyanocobalamin 1000 MCG tablet, Take 1,000 mcg by mouth daily. , Disp: , Rfl:  .  diclofenac sodium (VOLTAREN) 1 % GEL, Apply 4 g topically 4 (four) times daily., Disp: 1 Tube, Rfl: 0 .  glipiZIDE (GLUCOTROL) 5 MG tablet, Take 5 mg by mouth daily before breakfast., Disp: , Rfl:  .  hydrALAZINE (APRESOLINE) 25 MG tablet, Take 0.5 tablets (12.5 mg total) by mouth every 8 (eight) hours., Disp: 45 tablet, Rfl: 0 .  HYDROcodone-acetaminophen (NORCO/VICODIN) 5-325 MG tablet, Take 2 tablets by mouth every 4 (four) hours as needed. (Patient taking differently: Take 1 tablet by mouth 2 (two) times daily. ), Disp: 6 tablet, Rfl: 0 .  ipratropium-albuterol (DUONEB) 0.5-2.5 (3) MG/3ML SOLN, Take 3 mLs by nebulization every 4 (four) hours as needed., Disp: 360 mL, Rfl: 0 .  isosorbide dinitrate (DILATRATE-SR) 40 MG CR capsule, Take 40 mg by mouth 3 (three) times daily., Disp: , Rfl:  .  ketoconazole (NIZORAL) 2 % shampoo, Apply 1 application topically 2 (two) times a week., Disp: , Rfl:  .  MAGNESIUM PO, Take 0.5 tablets by mouth 2 (two) times daily., Disp: , Rfl:  .  potassium chloride (K-DUR) 10 MEQ tablet, Take 2 tablets (20 mEq total) by mouth daily., Disp: 20 tablet, Rfl: 0 .  sertraline (ZOLOFT) 25 MG tablet, Take 50 mg by mouth daily  as needed (for anxiety). Takes 1-2 times per week, Disp: , Rfl:  .  spironolactone (ALDACTONE) 25 MG tablet, Take 25 mg by mouth daily., Disp: , Rfl:  .  torsemide (DEMADEX) 20 MG tablet, Take 2 tablets (40 mg total) by mouth 2 (two) times daily., Disp: 120 tablet, Rfl: 0 .  digoxin (LANOXIN) 0.125 MG tablet, Take 1 tablet (0.125 mg total) by mouth daily. (Patient not taking: Reported on 02/24/2018), Disp: 30 tablet, Rfl: 0 .  finasteride (PROSCAR) 5 MG tablet, Take 5 mg by mouth daily., Disp: , Rfl:  .  tamsulosin (FLOMAX) 0.4 MG CAPS capsule, Take 0.4  mg by mouth at bedtime., Disp: , Rfl:  Allergies  Allergen Reactions  . Lisinopril Swelling    Facial swelling  . Xarelto [Rivaroxaban]     bleeding      Social History   Socioeconomic History  . Marital status: Married    Spouse name: Not on file  . Number of children: Not on file  . Years of education: Not on file  . Highest education level: Not on file  Occupational History  . Occupation: Retired    Comment: Former Patent examiner  . Financial resource strain: Not on file  . Food insecurity:    Worry: Not on file    Inability: Not on file  . Transportation needs:    Medical: Not on file    Non-medical: Not on file  Tobacco Use  . Smoking status: Former Smoker    Packs/day: 1.00    Years: 15.00    Pack years: 15.00    Types: Cigarettes, Cigars  . Smokeless tobacco: Former Neurosurgeon  . Tobacco comment: quit smoking ~ 50 yr ago  Substance and Sexual Activity  . Alcohol use: No    Alcohol/week: 1.2 oz    Types: 2 Cans of beer per week    Comment: rare beer.  . Drug use: No  . Sexual activity: Yes  Lifestyle  . Physical activity:    Days per week: Not on file    Minutes per session: Not on file  . Stress: Not on file  Relationships  . Social connections:    Talks on phone: Not on file    Gets together: Not on file    Attends religious service: Not on file    Active member of club or organization: Not on file    Attends meetings of clubs or organizations: Not on file    Relationship status: Not on file  . Intimate partner violence:    Fear of current or ex partner: Not on file    Emotionally abused: Not on file    Physically abused: Not on file    Forced sexual activity: Not on file  Other Topics Concern  . Not on file  Social History Narrative   Lives in [place] with wife. Moved from [place] (2013). Does not routinely exercise.    Physical Exam  Constitutional: He is oriented to person, place, and time.  Cardiovascular: Normal rate and regular  rhythm.  Pulmonary/Chest: Effort normal and breath sounds normal. No respiratory distress. He has no wheezes. He has no rales.  Abdominal: Soft.  Musculoskeletal: Normal range of motion. He exhibits no edema.  Neurological: He is alert and oriented to person, place, and time.  Skin: Skin is warm and dry.  Psychiatric: He has a normal mood and affect.    SAFE - 02/24/18 1400      Situation  Admitting diagnosis  CHF    Heart failure history  Exisiting    Comorbidities  Atrial fibillation    Readmitted within 30 days  No      Assessment   Lives alone  No    Primary support person  Wife    Mode of transportation  personal car    Home equipement  Cane;Scale      Weight   Weighs self daily  Yes    Scale provided  No    Records on weight chart  No      Resources   Has "Living better w/heart failure" book  Yes    Has HF Zone tool  Yes    Able to identify yellow zone signs/when to call MD  Yes    Records zone daily  Yes      Medications   Uses a pill box  No    Difficulty obtaining medications  No    Missed one or more doses of medications per week  No      Nutrition   Patient receives meals on wheels  No    Patient follows low sodium diet  Yes    Has foods at home that meet the current recommended diet  Yes    Nutritional concerns/issues  no      Activity Level   ADL's/Mobility  Independent    How many feet can patient ambulate  200      Urine   Difficulty urinating  No    Changes in urine  None      Community Paramedic Living Environment - 02/24/18 1400      Outside of House   Sidewalk and pathway to house is level and free from any hazards  Yes    Driveway is free from debris/snow/ice  Yes    Outside stairs are stable and have sturdy handrail  Yes    Porch lights are working and provide adequate lighting  Yes      Living Room   Furniture is of adequate height and offers arm rests that assist in getting up and down  Yes    Floor is free from any clutter that  would create tripping hazards  Yes    All cords are either behind furniture or secured in a manner that does not cause trip hazards  Yes    All rugs are secured to floor with double-sided tape  Yes    Lighting is adequate to light room  Yes    All lighting has an easily accessible on/off switch  Yes    Phone is readily accessible near favorite seating areas  Yes    Emergency numbers are printed near all phones in house  Yes      Kitchen   Items used most often are within easy reach on low shelves  Yes    Floor mats are non-slip tread and secured to floor  N/A    Oven controls are within easy reach  Yes    Kitchen lighting is adequate and easy to reach switches  Yes      Stairs   Carpet is properly secured to stairs and/or all wood is properly secured  Yes    Handrail is present and sturdy  Yes    Stairs are free from any clutter  Yes    Stairway is adequately lit  Yes      Bedroom   Floor is free from clutter  Yes    Light  is near bed and is easy to turn on  Yes    Phone is next to bed and within easy reach  Yes    Flashlight is near bed in case of emergency  Yes      General   Smoke detectors in all areas of the house (each floor) and tested  Yes    CO detectors on each floor of house and tested  Yes    Flashlights are handy throughout the home  Yes    Resident has all medical information readily available and in an area emergency providers will easily find  Yes    All heaters are away from any type of flammable material  Yes      Overall Tips   Homeowner ha good non-skid shoes to move around house  Yes    All assisted walking devices are readily accessible and in good condition  Yes    There is a phone near the floor for ease of reach in case of a fall  YES    All O2 tubing is less than 50 ft. and is not a trip hazard  N/A    Resident has had an annual hearing and vision check by a physician  Yes    Resident has the proper hearing and visual aids prescribed and are in good  working order  Yes    All medications are properly stored and labeled to avoid confusion on dosage, time to take, and avoidance of missed doses  Yes        Future Appointments  Date Time Provider Department Center  04/17/2018 11:30 AM Felecia Shelling, DPM [facility]    BP 110/70 (BP Location: Left Arm, Patient Position: Sitting, Cuff Size: Large)   Pulse 65   Resp 16   Wt 248 lb (112.5 kg)   SpO2 94%   BMI 38.84 kg/m   Weight yesterday- 246 lb Last visit weight- n/a  Mr Poehler was seen at home today and reported feeling well. He stated he has been losing weight since leaving the hospital and is happy with the torsemide which he has been started on. He is not taking digoxin because he reported that his cardiologist at the [facility] advised him to to until he is seen on May 8. Mr Mayabb also told me that he does not intend on going to the AHF clinic unless the [place] agrees to pay for the visits because he does not have Medicare Part B and cannot afford the cost of a visit. He gave me the information regarding his PCP and cardiologist at the [facility] and said there is a "[employer]" program that would allow him to be seen at the clinic and the [place] would pay. He said that in order for this to be approved, the clinic would have to make the request to the [place] directly. I will provide this information to [facility] and see if she is able to figure out what the next move should be. He does not use a pillbox at present and does not feel like it is necessary at this time but said if he feels like he is "slipping up" he is not opposed to using one. His medications were verified and notes were made in his chart.   Time spent with patient: 54 minutes  Jacqualine Code, EMT 02/24/18  ACTION: Home visit completed Next visit planned for 1 week

## 2018-02-27 ENCOUNTER — Encounter (HOSPITAL_COMMUNITY): Payer: Non-veteran care

## 2018-03-03 ENCOUNTER — Other Ambulatory Visit (HOSPITAL_COMMUNITY): Payer: Self-pay

## 2018-03-03 NOTE — Progress Notes (Signed)
Paramedicine Encounter    Patient ID: Ryan Macdonald, male    DOB: 04-Oct-1934, 84 y.o.   MRN: [id number]   Patient Care Team: [facility], [facility] - General (General Practice)  Patient Active Problem List   Diagnosis Date Noted  . Acute respiratory failure with hypoxia (HCC) 09/27/2017  . CHF exacerbation (HCC) 09/27/2017  . Renal insufficiency   . Acute on chronic systolic CHF (congestive heart failure) (HCC) 07/08/2017  . Left knee pain 07/08/2017  . Acute bronchitis 05/10/2017  . Acute congestive heart failure (HCC) 04/14/2017  . Elevated troponin 04/14/2017  . Lactic acidosis   . Acute on chronic systolic heart failure, NYHA class 2 (HCC) 02/03/2017  . Diabetes mellitus type 2 in obese (HCC) 02/03/2017  . CAP (community acquired pneumonia) 02/03/2017  . Lobar pneumonia (HCC)   . Diabetes mellitus with complication (HCC)   . Acute on chronic respiratory failure with hypoxia (HCC)   . Acute on chronic combined systolic and diastolic CHF (congestive heart failure) (HCC) 05/19/2016  . Depression 05/19/2016  . Gout 05/19/2016  . 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 NSVT (nonsustained ventricular tachycardia) (HCC)   . Chronic renal insufficiency, stage III (moderate) (HCC)   . Pulmonary embolism (HCC)   . Acute respiratory failure (HCC)   . Osteoarthritis of left hip 10/25/2013  . Atrial fibrillation (HCC) 10/25/2013  . S/P hip replacement 10/24/2013  . Nonischemic cardiomyopathy (HCC)   . Obesity (BMI 30-39.9)   . Essential hypertension   . Hyperlipidemia     Current Outpatient Medications:  .  allopurinol (ZYLOPRIM) 100 MG tablet, Take 200 mg by mouth daily., Disp: , Rfl:  .  amiodarone (PACERONE) 200 MG tablet, Take 1 tablet (200 mg total) by mouth 2 (two) times daily., Disp: 60 tablet, Rfl: 0 .  apixaban (ELIQUIS) 5 MG TABS tablet, Take 1 tablet (5 mg total) by mouth 2 (two) times daily., Disp: 60 tablet, Rfl: 0 .  carvedilol  (COREG) 12.5 MG tablet, Take 0.5 tablets (6.25 mg total) by mouth 2 (two) times daily with a meal., Disp: 30 tablet, Rfl: 0 .  cyanocobalamin 1000 MCG tablet, Take 1,000 mcg by mouth daily. , Disp: , Rfl:  .  diclofenac sodium (VOLTAREN) 1 % GEL, Apply 4 g topically 4 (four) times daily., Disp: 1 Tube, Rfl: 0 .  finasteride (PROSCAR) 5 MG tablet, Take 5 mg by mouth daily., Disp: , Rfl:  .  glipiZIDE (GLUCOTROL) 5 MG tablet, Take 5 mg by mouth daily before breakfast., Disp: , Rfl:  .  hydrALAZINE (APRESOLINE) 25 MG tablet, Take 0.5 tablets (12.5 mg total) by mouth every 8 (eight) hours., Disp: 45 tablet, Rfl: 0 .  HYDROcodone-acetaminophen (NORCO/VICODIN) 5-325 MG tablet, Take 2 tablets by mouth every 4 (four) hours as needed. (Patient taking differently: Take 1 tablet by mouth 2 (two) times daily. ), Disp: 6 tablet, Rfl: 0 .  ipratropium-albuterol (DUONEB) 0.5-2.5 (3) MG/3ML SOLN, Take 3 mLs by nebulization every 4 (four) hours as needed., Disp: 360 mL, Rfl: 0 .  isosorbide dinitrate (DILATRATE-SR) 40 MG CR capsule, Take 40 mg by mouth 3 (three) times daily., Disp: , Rfl:  .  ketoconazole (NIZORAL) 2 % shampoo, Apply 1 application topically 2 (two) times a week., Disp: , Rfl:  .  MAGNESIUM PO, Take 0.5 tablets by mouth 2 (two) times daily., Disp: , Rfl:  .  potassium chloride (K-DUR) 10 MEQ tablet, Take 2 tablets (20 mEq total) by mouth daily., Disp:  20 tablet, Rfl: 0 .  sertraline (ZOLOFT) 25 MG tablet, Take 50 mg by mouth daily as needed (for anxiety). Takes 1-2 times per week, Disp: , Rfl:  .  spironolactone (ALDACTONE) 25 MG tablet, Take 25 mg by mouth daily., Disp: , Rfl:  .  tamsulosin (FLOMAX) 0.4 MG CAPS capsule, Take 0.4 mg by mouth at bedtime., Disp: , Rfl:  .  torsemide (DEMADEX) 20 MG tablet, Take 2 tablets (40 mg total) by mouth 2 (two) times daily., Disp: 120 tablet, Rfl: 0 .  digoxin (LANOXIN) 0.125 MG tablet, Take 1 tablet (0.125 mg total) by mouth daily. (Patient not taking: Reported  on 03/03/2018), Disp: 30 tablet, Rfl: 0 Allergies  Allergen Reactions  . Lisinopril Swelling    Facial swelling  . Xarelto [Rivaroxaban]     bleeding      Social History   Socioeconomic History  . Marital status: Married    Spouse name: Not on file  . Number of children: Not on file  . Years of education: Not on file  . Highest education level: Not on file  Occupational History  . Occupation: Retired    Comment: Former Patent examiner  . Financial resource strain: Not on file  . Food insecurity:    Worry: Not on file    Inability: Not on file  . Transportation needs:    Medical: Not on file    Non-medical: Not on file  Tobacco Use  . Smoking status: Former Smoker    Packs/day: 1.00    Years: 15.00    Pack years: 15.00    Types: Cigarettes, Cigars  . Smokeless tobacco: Former Neurosurgeon  . Tobacco comment: quit smoking ~ 50 yr ago  Substance and Sexual Activity  . Alcohol use: No    Alcohol/week: 1.2 oz    Types: 2 Cans of beer per week    Comment: rare beer.  . Drug use: No  . Sexual activity: Yes  Lifestyle  . Physical activity:    Days per week: Not on file    Minutes per session: Not on file  . Stress: Not on file  Relationships  . Social connections:    Talks on phone: Not on file    Gets together: Not on file    Attends religious service: Not on file    Active member of club or organization: Not on file    Attends meetings of clubs or organizations: Not on file    Relationship status: Not on file  . Intimate partner violence:    Fear of current or ex partner: Not on file    Emotionally abused: Not on file    Physically abused: Not on file    Forced sexual activity: Not on file  Other Topics Concern  . Not on file  Social History Narrative   Lives in [place] with wife. Moved from [place] (2013). Does not routinely exercise.    Physical Exam  Constitutional: He is oriented to person, place, and time.  Cardiovascular: Normal rate and regular  rhythm.  Pulmonary/Chest: Effort normal and breath sounds normal. No respiratory distress. He has no wheezes. He has no rales.  Abdominal: Soft. He exhibits no distension.  Musculoskeletal: Normal range of motion. He exhibits no edema.  Neurological: He is alert and oriented to person, place, and time.  Skin: Skin is warm and dry.  Psychiatric: He has a normal mood and affect.    SAFE - 02/24/18 1400  Situation   Admitting diagnosis  CHF    Heart failure history  Exisiting    Comorbidities  Atrial fibillation    Readmitted within 30 days  No      Assessment   Lives alone  No    Primary support person  Wife    Mode of transportation  personal car    Home equipement  Cane;Scale      Weight   Weighs self daily  Yes    Scale provided  No    Records on weight chart  No      Resources   Has "Living better w/heart failure" book  Yes    Has HF Zone tool  Yes    Able to identify yellow zone signs/when to call MD  Yes    Records zone daily  Yes      Medications   Uses a pill box  No    Difficulty obtaining medications  No    Missed one or more doses of medications per week  No      Nutrition   Patient receives meals on wheels  No    Patient follows low sodium diet  Yes    Has foods at home that meet the current recommended diet  Yes    Nutritional concerns/issues  no      Activity Level   ADL's/Mobility  Independent    How many feet can patient ambulate  200      Urine   Difficulty urinating  No    Changes in urine  None         Future Appointments  Date Time Provider Department Center  04/17/2018 11:30 AM Felecia Shelling, DPM [facility]    BP 112/64 (BP Location: Left Arm, Patient Position: Sitting, Cuff Size: Large)   Pulse 68   Resp 16   Wt 244 lb (110.7 kg)   SpO2 96%   BMI 38.22 kg/m   Weight yesterday- 243 lb  Last visit weight- 248 lb  Ryan Macdonald was seen at home today and reported feeling well. He stated that he has been taking his  medications at prescribed and does not miss any doses. He denied SOB, headache, dizziness or orthopnea. He reported having some constipation earlier this week so I suggested getting OTC stool softeners and he agreed. He is still not taking Digoxin because his cardiologist at the [facility] has advised him against it until his next appointment there. I still need to find out more about the "[employer]" program so that he can be see at the AHF clinic. No other issues at this time.  Time spent with patient: 29 minutes  Jacqualine Code, EMT 03/03/18  ACTION: Home visit completed Next visit planned for 1 week

## 2018-03-08 ENCOUNTER — Telehealth (HOSPITAL_COMMUNITY): Payer: Self-pay

## 2018-03-08 NOTE — Telephone Encounter (Signed)
I called Mr Ryan Macdonald to advised him of the information I obtained from the [place]. I called the [facility] on [facility] of [facility] Ryan Macdonald to inquire about the "[employer]" program which he said was required for him to be seen at the HF clinic. The [facility] representative advised that he would need his PCP to give him a "community care consult" in order for the [facility] to pay the bill. Upon giving him this information, Mr Ryan Macdonald said he understood and would call his PCP as soon as he hung up with me.

## 2018-03-10 ENCOUNTER — Telehealth (HOSPITAL_COMMUNITY): Payer: Self-pay

## 2018-03-10 NOTE — Telephone Encounter (Signed)
Ryan Macdonald called me to let me know he had spoken to the [place] and they would be calling me regarding his community care consult. Before I hung up with him, he said he had put a battery in his pulse oximeter but it wasn't working and wanted to know what to do. I asked if the battery was in the correct was and he answered in the affirmative. I asked if he was sure the battery was good and he said he didn't know. I advised him to get a new battery try that. He was agreeable to this solution.

## 2018-03-16 ENCOUNTER — Telehealth (HOSPITAL_COMMUNITY): Payer: Self-pay

## 2018-03-16 NOTE — Telephone Encounter (Signed)
I called Mr Douds to ask if he had received any information from the [place] regarding being seen at the HF clinic. He did nit answer so I left a voicemail requesting he call me back.

## 2018-03-22 ENCOUNTER — Telehealth (HOSPITAL_COMMUNITY): Payer: Self-pay

## 2018-03-22 NOTE — Telephone Encounter (Signed)
I contacted Mr Kleinfeld to see if he had spoken to the [place] to determine if they would pay for his visit at the HF clinic. He stated that he called them but they would not tell him anything and requested to speak to me directly. He gave me the number and I [facility] however the social worker was not in the office so I left a message to have her call me.

## 2018-03-23 ENCOUNTER — Telehealth (HOSPITAL_COMMUNITY): Payer: Self-pay

## 2018-03-24 NOTE — Telephone Encounter (Signed)
I called Mr Jamerson to inform him of the conversation I had with the [place]. He did not answer so I left a message requesting he call me back.

## 2018-03-29 ENCOUNTER — Telehealth (HOSPITAL_COMMUNITY): Payer: Self-pay | Admitting: Surgery

## 2018-03-29 NOTE — Telephone Encounter (Signed)
Mr Drinkard will be discharged at this time from the HF Community Paramedicine program due to the fact that he is unable to be seen in the HF Clinic.  He has contacted the [place] in order to get approval for his clinic visits and has yet to get those approved.  He does not want to come and incur additional costs that may apply.  He will contact us back if he is able to get approval and possibly become re-enrolled in the HF Paramedicine program.

## 2018-04-17 ENCOUNTER — Ambulatory Visit: Payer: Non-veteran care | Admitting: Podiatry

## 2018-04-17 ENCOUNTER — Ambulatory Visit (INDEPENDENT_AMBULATORY_CARE_PROVIDER_SITE_OTHER): Payer: Non-veteran care | Admitting: Podiatry

## 2018-04-17 DIAGNOSIS — B351 Tinea unguium: Secondary | ICD-10-CM

## 2018-04-17 DIAGNOSIS — E0843 Diabetes mellitus due to underlying condition with diabetic autonomic (poly)neuropathy: Secondary | ICD-10-CM

## 2018-04-17 DIAGNOSIS — M79676 Pain in unspecified toe(s): Secondary | ICD-10-CM | POA: Diagnosis not present

## 2018-04-19 NOTE — Progress Notes (Signed)
   SUBJECTIVE Patient with a history of diabetes mellitus presents to office today complaining of elongated, thickened nails that cause pain while ambulating in shoes. He is unable to trim his own nails. Patient is here for further evaluation and treatment.   Past Medical History:  Diagnosis Date  . Cat scratch fever    removed mass on right side neck  . Cataracts, bilateral   . CHF (congestive heart failure) (HCC)   . Diabetes mellitus without complication (HCC)    TYPE 2  . High cholesterol   . Hypertension   . Kidney stones   . Morbid obesity (HCC)   . Nonischemic cardiomyopathy (HCC)    a. ? 2009 Cath in [place] - nl cors per pt;  b. 09/2013 Echo: EF 25-30%, sev diff HK.  . Osteoarthritis   . PAF (paroxysmal atrial fibrillation) (HCC)    a. post-op hip in 2014 - prev on xarelto.  . Renal insufficiency     OBJECTIVE General Patient is awake, alert, and oriented x 3 and in no acute distress. Derm Skin is dry and supple bilateral. Negative open lesions or macerations. Remaining integument unremarkable. Nails are tender, long, thickened and dystrophic with subungual debris, consistent with onychomycosis, 1-5 bilateral. No signs of infection noted. Vasc  DP and PT pedal pulses palpable bilaterally. Temperature gradient within normal limits.  Neuro Epicritic and protective threshold sensation diminished bilaterally.  Musculoskeletal Exam No symptomatic pedal deformities noted bilateral. Muscular strength within normal limits.  ASSESSMENT 1. Diabetes Mellitus w/ peripheral neuropathy 2. Onychomycosis of nail due to dermatophyte bilateral 3. Pain in foot bilateral  PLAN OF CARE 1. Patient evaluated today. 2. Instructed to maintain good pedal hygiene and foot care. Stressed importance of controlling blood sugar.  3. Mechanical debridement of nails 1-5 bilaterally performed using a nail nipper. Filed with dremel without incident.  4. Return to clinic in 3 mos.     Felecia Shelling,  DPM [facility]  Dr. Felecia Shelling, DPM    [address]                                        [place], [place] [address]                Office [phone]  Fax [phone]

## 2018-04-25 IMAGING — CT CT ANGIO CHEST
1 of 8 series · 17 of 36 positions shown · IV contrast (Iohexol (Omnipaque 350))
Comparison: Current chest radiographs.  Chest CTA, 05/25/2015

CLINICAL DATA: Pt. Reports having sob developed this am. Denies any
chest pain. Skin is warm and dry. No distress noted in Triage. Pt.
Has a cardiac hx. H/o HTN, diabetes, CHF

EXAM:
CT ANGIOGRAPHY CHEST WITH CONTRAST
TECHNIQUE: Multidetector CT imaging of the chest was performed using the
standard protocol during bolus administration of intravenous
contrast. Multiplanar CT image reconstructions and MIPs were
obtained to evaluate the vascular anatomy.
CONTRAST:  100 mL of Isovue 370 intravenous contrast

[Series 406: thins pacs · axial · 0.69mm/px · z∈[+678,+915]mm · 17 of 267 slices shown]
[im 15/267  lung]
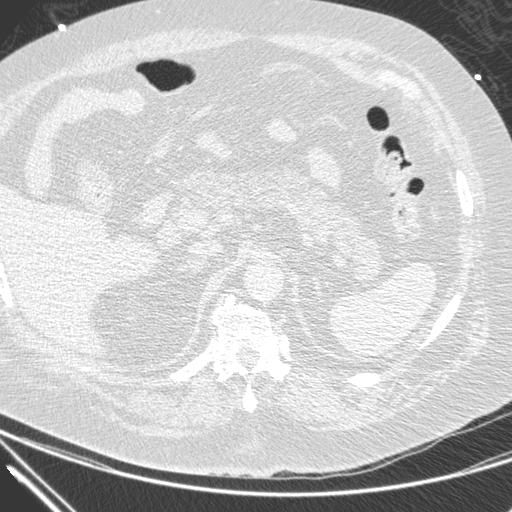
[im 30/267  mediastinal]
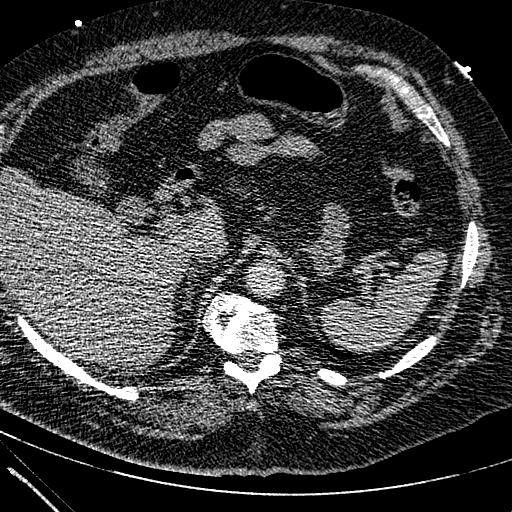
[im 45/267  lung]
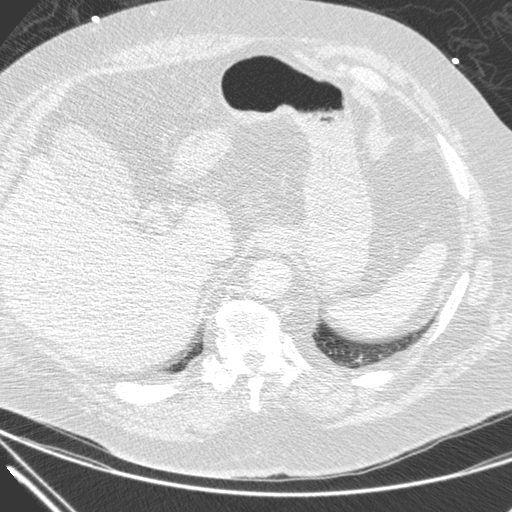
[im 60/267  mediastinal]
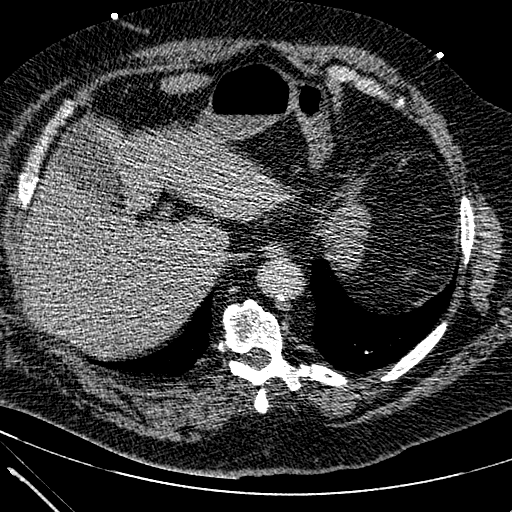
[im 74/267  lung]
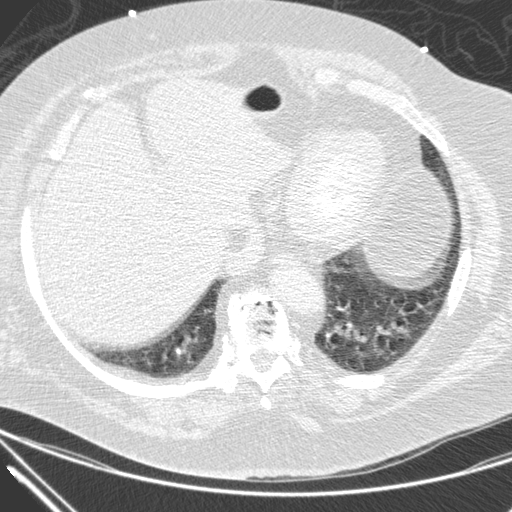
[im 89/267  mediastinal]
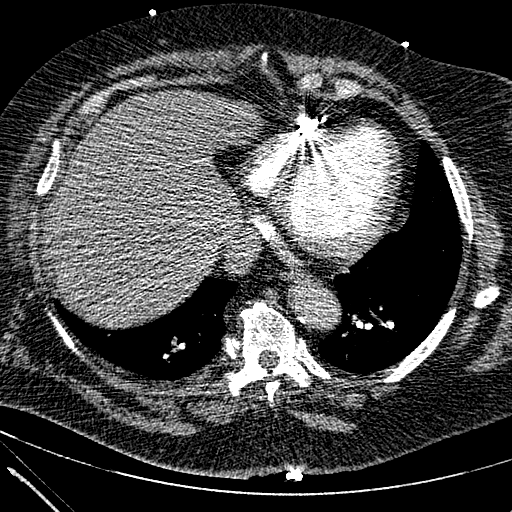
[im 104/267  lung]
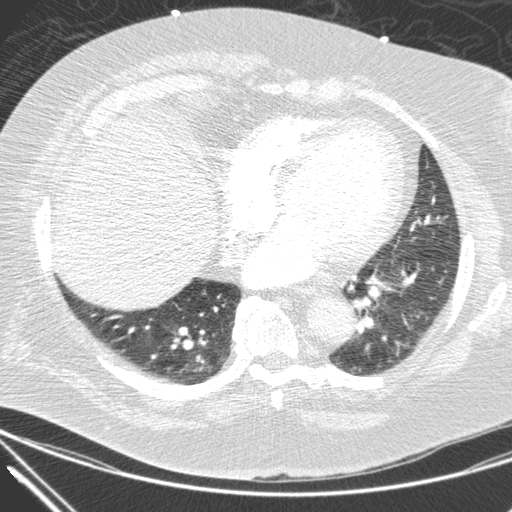
[im 119/267  mediastinal]
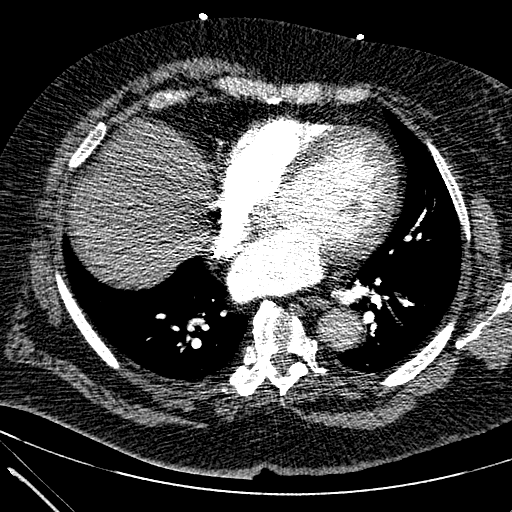
[im 134/267  lung]
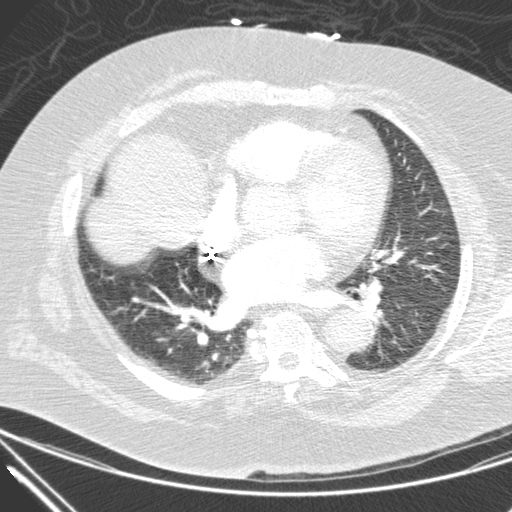
[im 148/267  mediastinal]
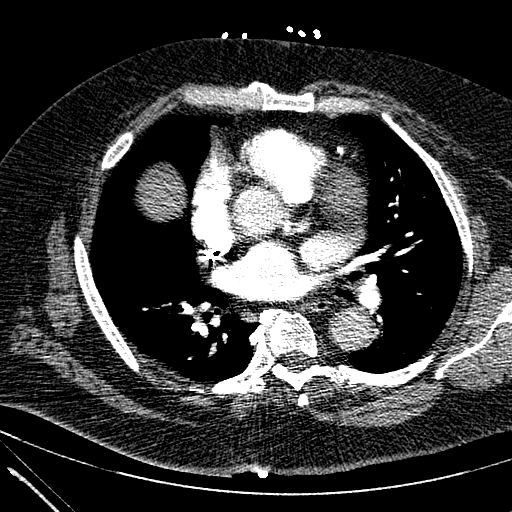
[im 163/267  lung]
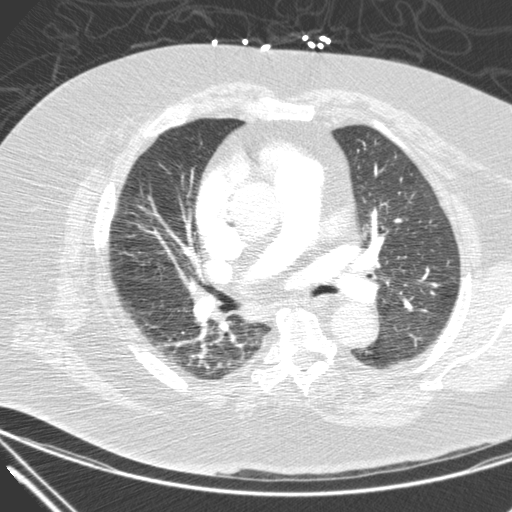
[im 178/267  mediastinal]
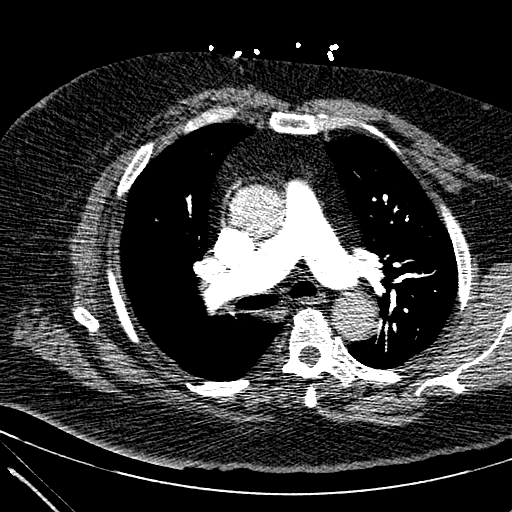
[im 193/267  lung]
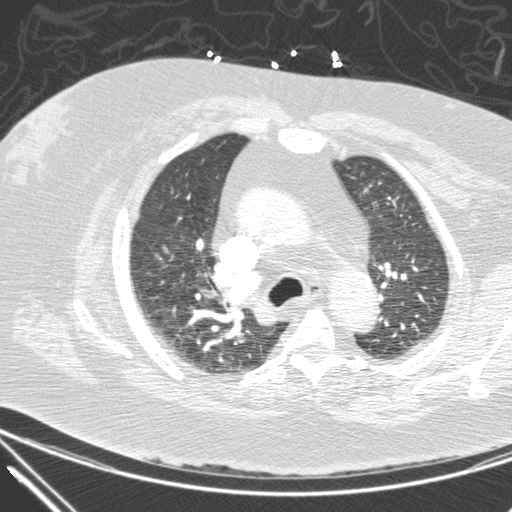
[im 207/267  mediastinal]
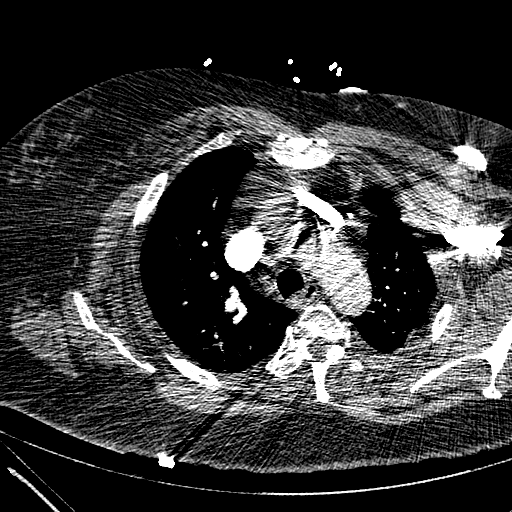
[im 222/267  lung]
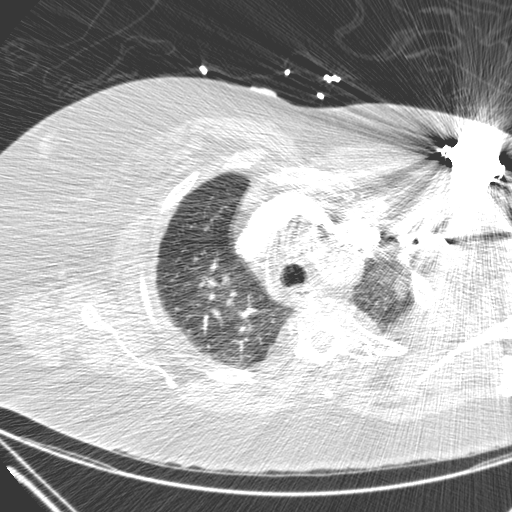
[im 237/267  mediastinal]
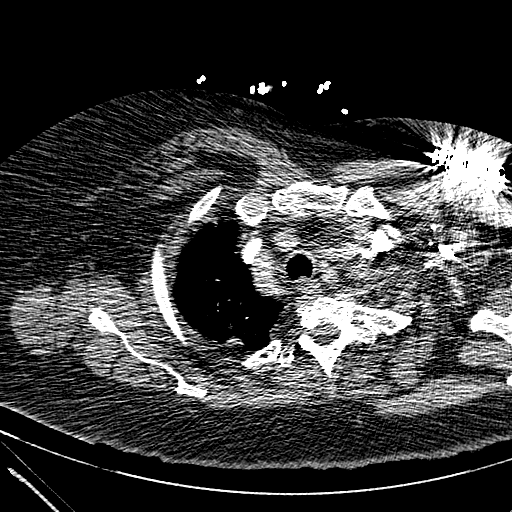
[im 252/267  lung]
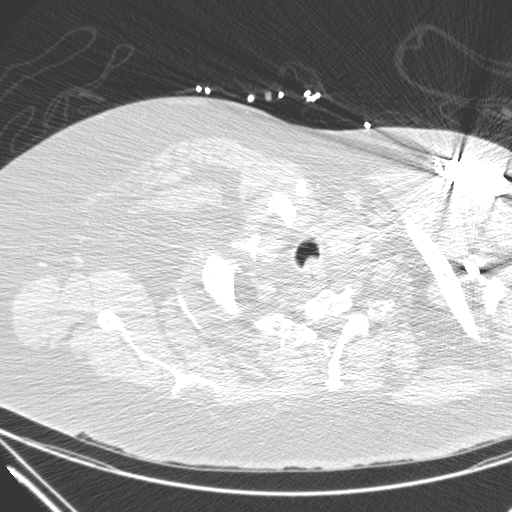

[17 of 36 positions shown; findings below may reference images not displayed]

FINDINGS: Angiographic study: No evidence of a pulmonary embolus. The great
vessels are normal in caliber. No aortic dissection. Mild partly
calcified atherosclerotic plaque noted along the thoracic aorta.

Neck base and axilla:  No mass or adenopathy.

Mediastinum and hila: Heart is normal in size and configuration.
There are moderate coronary artery calcifications. No mediastinal or
hilar masses. Stable 12 mm short axis right subcarinal lymph node.
No other enlarged lymph nodes.

Lungs and pleura: Clear lungs. No pleural effusion. No pneumothorax.

Limited upper abdomen: No acute findings. Partly imaged
calcification in the upper pole the right kidney.

Musculoskeletal: Degenerative changes noted throughout the
visualized spine. No osteoblastic or osteolytic lesions.

Review of the MIP images confirms the above findings.
IMPRESSION: 1. No evidence of a pulmonary embolism.
2. No acute findings.  Lungs are clear.

## 2018-04-29 ENCOUNTER — Encounter (HOSPITAL_COMMUNITY): Payer: Self-pay | Admitting: Emergency Medicine

## 2018-04-29 ENCOUNTER — Inpatient Hospital Stay (HOSPITAL_COMMUNITY)
Admission: EM | Admit: 2018-04-29 | Discharge: 2018-05-04 | DRG: 291 | Disposition: A | Discharge status: Home / Self Care | Payer: Non-veteran care | Attending: Oncology | Admitting: Oncology

## 2018-04-29 ENCOUNTER — Emergency Department (HOSPITAL_COMMUNITY): Payer: Non-veteran care

## 2018-04-29 DIAGNOSIS — I5021 Acute systolic (congestive) heart failure: Secondary | ICD-10-CM | POA: Diagnosis present

## 2018-04-29 DIAGNOSIS — I480 Paroxysmal atrial fibrillation: Secondary | ICD-10-CM | POA: Diagnosis present

## 2018-04-29 DIAGNOSIS — E872 Acidosis: Secondary | ICD-10-CM | POA: Diagnosis present

## 2018-04-29 DIAGNOSIS — R0989 Other specified symptoms and signs involving the circulatory and respiratory systems: Secondary | ICD-10-CM | POA: Diagnosis not present

## 2018-04-29 DIAGNOSIS — I428 Other cardiomyopathies: Secondary | ICD-10-CM | POA: Diagnosis present

## 2018-04-29 DIAGNOSIS — R0689 Other abnormalities of breathing: Secondary | ICD-10-CM | POA: Diagnosis not present

## 2018-04-29 DIAGNOSIS — R0600 Dyspnea, unspecified: Secondary | ICD-10-CM | POA: Diagnosis not present

## 2018-04-29 DIAGNOSIS — Z86711 Personal history of pulmonary embolism: Secondary | ICD-10-CM

## 2018-04-29 DIAGNOSIS — I440 Atrioventricular block, first degree: Secondary | ICD-10-CM

## 2018-04-29 DIAGNOSIS — N183 Chronic kidney disease, stage 3 unspecified: Secondary | ICD-10-CM

## 2018-04-29 DIAGNOSIS — M109 Gout, unspecified: Secondary | ICD-10-CM | POA: Diagnosis present

## 2018-04-29 DIAGNOSIS — Z87891 Personal history of nicotine dependence: Secondary | ICD-10-CM | POA: Diagnosis not present

## 2018-04-29 DIAGNOSIS — Z8249 Family history of ischemic heart disease and other diseases of the circulatory system: Secondary | ICD-10-CM | POA: Diagnosis not present

## 2018-04-29 DIAGNOSIS — I340 Nonrheumatic mitral (valve) insufficiency: Secondary | ICD-10-CM | POA: Diagnosis not present

## 2018-04-29 DIAGNOSIS — I5043 Acute on chronic combined systolic (congestive) and diastolic (congestive) heart failure: Secondary | ICD-10-CM | POA: Diagnosis present

## 2018-04-29 DIAGNOSIS — R0902 Hypoxemia: Secondary | ICD-10-CM | POA: Diagnosis present

## 2018-04-29 DIAGNOSIS — Z79899 Other long term (current) drug therapy: Secondary | ICD-10-CM | POA: Diagnosis not present

## 2018-04-29 DIAGNOSIS — J9602 Acute respiratory failure with hypercapnia: Secondary | ICD-10-CM

## 2018-04-29 DIAGNOSIS — M10072 Idiopathic gout, left ankle and foot: Secondary | ICD-10-CM | POA: Diagnosis not present

## 2018-04-29 DIAGNOSIS — N400 Enlarged prostate without lower urinary tract symptoms: Secondary | ICD-10-CM | POA: Diagnosis present

## 2018-04-29 DIAGNOSIS — Z7984 Long term (current) use of oral hypoglycemic drugs: Secondary | ICD-10-CM

## 2018-04-29 DIAGNOSIS — M549 Dorsalgia, unspecified: Secondary | ICD-10-CM | POA: Diagnosis not present

## 2018-04-29 DIAGNOSIS — I130 Hypertensive heart and chronic kidney disease with heart failure and stage 1 through stage 4 chronic kidney disease, or unspecified chronic kidney disease: Secondary | ICD-10-CM | POA: Diagnosis present

## 2018-04-29 DIAGNOSIS — R0602 Shortness of breath: Secondary | ICD-10-CM

## 2018-04-29 DIAGNOSIS — J9601 Acute respiratory failure with hypoxia: Secondary | ICD-10-CM | POA: Diagnosis present

## 2018-04-29 DIAGNOSIS — Z6838 Body mass index (BMI) 38.0-38.9, adult: Secondary | ICD-10-CM | POA: Diagnosis not present

## 2018-04-29 DIAGNOSIS — E1122 Type 2 diabetes mellitus with diabetic chronic kidney disease: Secondary | ICD-10-CM | POA: Diagnosis present

## 2018-04-29 DIAGNOSIS — G4733 Obstructive sleep apnea (adult) (pediatric): Secondary | ICD-10-CM | POA: Diagnosis present

## 2018-04-29 DIAGNOSIS — J988 Other specified respiratory disorders: Secondary | ICD-10-CM | POA: Diagnosis not present

## 2018-04-29 DIAGNOSIS — Z888 Allergy status to other drugs, medicaments and biological substances status: Secondary | ICD-10-CM | POA: Diagnosis not present

## 2018-04-29 DIAGNOSIS — E7800 Pure hypercholesterolemia, unspecified: Secondary | ICD-10-CM | POA: Diagnosis present

## 2018-04-29 DIAGNOSIS — M10061 Idiopathic gout, right knee: Secondary | ICD-10-CM | POA: Diagnosis not present

## 2018-04-29 DIAGNOSIS — I5023 Acute on chronic systolic (congestive) heart failure: Secondary | ICD-10-CM | POA: Diagnosis not present

## 2018-04-29 DIAGNOSIS — Z7901 Long term (current) use of anticoagulants: Secondary | ICD-10-CM | POA: Diagnosis not present

## 2018-04-29 DIAGNOSIS — Z9111 Patient's noncompliance with dietary regimen: Secondary | ICD-10-CM | POA: Diagnosis not present

## 2018-04-29 LAB — I-STAT VENOUS BLOOD GAS, ED
Acid-base deficit: 9 mmol/L — ABNORMAL HIGH (ref 0.0–2.0)
Bicarbonate: 21.7 mmol/L (ref 20.0–28.0)
O2 Saturation: 77 %
PH VEN: 7.156 — AB (ref 7.250–7.430)
TCO2: 24 mmol/L (ref 22–32)
pCO2, Ven: 61.4 mmHg — ABNORMAL HIGH (ref 44.0–60.0)
pO2, Ven: 54 mmHg — ABNORMAL HIGH (ref 32.0–45.0)

## 2018-04-29 LAB — BASIC METABOLIC PANEL
Anion gap: 11 (ref 5–15)
BUN: 24 mg/dL — ABNORMAL HIGH (ref 6–20)
CO2: 25 mmol/L (ref 22–32)
Calcium: 9 mg/dL (ref 8.9–10.3)
Chloride: 105 mmol/L (ref 101–111)
Creatinine, Ser: 1.6 mg/dL — ABNORMAL HIGH (ref 0.61–1.24)
GFR calc non Af Amer: 39 mL/min — ABNORMAL LOW (ref >60)
GFR, EST AFRICAN AMERICAN: 45 mL/min — AB (ref >60)
Glucose, Bld: 203 mg/dL — ABNORMAL HIGH (ref 65–99)
POTASSIUM: 3.8 mmol/L (ref 3.5–5.1)
SODIUM: 141 mmol/L (ref 135–145)

## 2018-04-29 LAB — CBC WITH DIFFERENTIAL/PLATELET
Abs Immature Granulocytes: 0 10*3/uL (ref 0.0–0.1)
Basophils Absolute: 0.1 10*3/uL (ref 0.0–0.1)
Basophils Relative: 1 %
EOS PCT: 3 %
Eosinophils Absolute: 0.3 10*3/uL (ref 0.0–0.7)
HEMATOCRIT: 45.2 % (ref 39.0–52.0)
HEMOGLOBIN: 14.3 g/dL (ref 13.0–17.0)
IMMATURE GRANULOCYTES: 0 %
LYMPHS ABS: 4.4 10*3/uL — AB (ref 0.7–4.0)
LYMPHS PCT: 41 %
MCH: 30 pg (ref 26.0–34.0)
MCHC: 31.6 g/dL (ref 30.0–36.0)
MCV: 94.8 fL (ref 78.0–100.0)
MONOS PCT: 7 %
Monocytes Absolute: 0.7 10*3/uL (ref 0.1–1.0)
Neutro Abs: 5.4 10*3/uL (ref 1.7–7.7)
Neutrophils Relative %: 48 %
Platelets: 265 10*3/uL (ref 150–400)
RBC: 4.77 MIL/uL (ref 4.22–5.81)
RDW: 15 % (ref 11.5–15.5)
WBC: 11 10*3/uL — ABNORMAL HIGH (ref 4.0–10.5)

## 2018-04-29 LAB — I-STAT TROPONIN, ED: TROPONIN I, POC: 0.06 ng/mL (ref 0.00–0.08)

## 2018-04-29 LAB — CBG MONITORING, ED
Glucose-Capillary: 96 mg/dL (ref 65–99)
Glucose-Capillary: 141 mg/dL — ABNORMAL HIGH (ref 65–99)

## 2018-04-29 LAB — DIGOXIN LEVEL

## 2018-04-29 LAB — TROPONIN I: Troponin I: 0.18 ng/mL (ref <0.03)

## 2018-04-29 LAB — BRAIN NATRIURETIC PEPTIDE: B Natriuretic Peptide: 447.2 pg/mL — ABNORMAL HIGH (ref 0.0–100.0)

## 2018-04-29 MED ORDER — BISACODYL 5 MG PO TBEC
5.0000 mg | DELAYED_RELEASE_TABLET | Freq: Every day | ORAL | Status: DC | PRN
  Administered 2018-05-04: 5 mg via ORAL
  Filled 2018-04-29: qty 1

## 2018-04-29 MED ORDER — AMIODARONE HCL 200 MG PO TABS
200.0000 mg | ORAL_TABLET | Freq: Two times a day (BID) | ORAL | Status: DC
  Administered 2018-04-29: 200 mg via ORAL
  Filled 2018-04-29: qty 1

## 2018-04-29 MED ORDER — ALLOPURINOL 100 MG PO TABS
200.0000 mg | ORAL_TABLET | Freq: Every day | ORAL | Status: DC
  Administered 2018-04-30 – 2018-05-04 (×5): 200 mg via ORAL
  Filled 2018-04-29 (×6): qty 2

## 2018-04-29 MED ORDER — CARVEDILOL 6.25 MG PO TABS
6.2500 mg | ORAL_TABLET | Freq: Two times a day (BID) | ORAL | Status: DC
  Administered 2018-04-29 – 2018-05-04 (×11): 6.25 mg via ORAL
  Filled 2018-04-29 (×11): qty 1

## 2018-04-29 MED ORDER — APIXABAN 5 MG PO TABS
5.0000 mg | ORAL_TABLET | Freq: Two times a day (BID) | ORAL | Status: DC
  Administered 2018-04-30 – 2018-05-02 (×5): 5 mg via ORAL
  Filled 2018-04-29 (×5): qty 1

## 2018-04-29 MED ORDER — ISOSORBIDE DINITRATE ER 40 MG PO CPCR
40.0000 mg | ORAL_CAPSULE | Freq: Three times a day (TID) | ORAL | Status: DC
  Administered 2018-04-30 – 2018-05-04 (×14): 40 mg via ORAL
  Filled 2018-04-29 (×15): qty 1

## 2018-04-29 MED ORDER — HYDRALAZINE HCL 25 MG PO TABS
12.5000 mg | ORAL_TABLET | Freq: Three times a day (TID) | ORAL | Status: DC
  Administered 2018-04-30 – 2018-05-04 (×12): 12.5 mg via ORAL
  Filled 2018-04-29 (×14): qty 1

## 2018-04-29 MED ORDER — HYDRALAZINE HCL 25 MG PO TABS: 12.5000 mg | ORAL_TABLET | Freq: Three times a day (TID) | ORAL | Status: DC

## 2018-04-29 MED ORDER — NITROGLYCERIN IN D5W 200-5 MCG/ML-% IV SOLN
INTRAVENOUS | Status: AC
  Administered 2018-04-29: 10 ug/min via INTRAVENOUS
  Filled 2018-04-29: qty 250

## 2018-04-29 MED ORDER — AMIODARONE HCL 200 MG PO TABS
200.0000 mg | ORAL_TABLET | Freq: Two times a day (BID) | ORAL | Status: DC
  Administered 2018-04-30 – 2018-05-04 (×9): 200 mg via ORAL
  Filled 2018-04-29 (×9): qty 1

## 2018-04-29 MED ORDER — APIXABAN 5 MG PO TABS
5.0000 mg | ORAL_TABLET | Freq: Two times a day (BID) | ORAL | Status: DC
  Administered 2018-04-29: 5 mg via ORAL
  Filled 2018-04-29 (×2): qty 1

## 2018-04-29 MED ORDER — POTASSIUM CHLORIDE CRYS ER 10 MEQ PO TBCR
20.0000 meq | EXTENDED_RELEASE_TABLET | Freq: Every day | ORAL | Status: DC
  Administered 2018-04-29 – 2018-05-04 (×6): 20 meq via ORAL
  Filled 2018-04-29 (×8): qty 2

## 2018-04-29 MED ORDER — IPRATROPIUM-ALBUTEROL 0.5-2.5 (3) MG/3ML IN SOLN: 3.0000 mL | RESPIRATORY_TRACT | Status: DC | PRN

## 2018-04-29 MED ORDER — TAMSULOSIN HCL 0.4 MG PO CAPS
0.4000 mg | ORAL_CAPSULE | Freq: Every day | ORAL | Status: DC
  Administered 2018-04-29 – 2018-05-03 (×5): 0.4 mg via ORAL
  Filled 2018-04-29 (×6): qty 1

## 2018-04-29 MED ORDER — FINASTERIDE 5 MG PO TABS
5.0000 mg | ORAL_TABLET | Freq: Every day | ORAL | Status: DC
  Administered 2018-04-29 – 2018-05-04 (×6): 5 mg via ORAL
  Filled 2018-04-29 (×6): qty 1

## 2018-04-29 MED ORDER — HYDROCODONE-ACETAMINOPHEN 5-325 MG PO TABS
1.0000 | ORAL_TABLET | Freq: Two times a day (BID) | ORAL | Status: DC | PRN
  Administered 2018-04-29 – 2018-05-02 (×5): 1 via ORAL
  Filled 2018-04-29 (×5): qty 1

## 2018-04-29 MED ORDER — ENOXAPARIN SODIUM 40 MG/0.4ML ~~LOC~~ SOLN: 40.0000 mg | SUBCUTANEOUS | Status: DC

## 2018-04-29 MED ORDER — INSULIN ASPART 100 UNIT/ML ~~LOC~~ SOLN
0.0000 [IU] | Freq: Three times a day (TID) | SUBCUTANEOUS | Status: DC
  Administered 2018-05-01 – 2018-05-02 (×2): 2 [IU] via SUBCUTANEOUS
  Administered 2018-05-02: 3 [IU] via SUBCUTANEOUS
  Administered 2018-05-02 – 2018-05-03 (×2): 2 [IU] via SUBCUTANEOUS
  Administered 2018-05-03 (×2): 3 [IU] via SUBCUTANEOUS
  Administered 2018-05-04 (×2): 2 [IU] via SUBCUTANEOUS
  Administered 2018-05-04: 5 [IU] via SUBCUTANEOUS

## 2018-04-29 MED ORDER — ISOSORBIDE DINITRATE ER 40 MG PO CPCR: 40.0000 mg | ORAL_CAPSULE | Freq: Three times a day (TID) | ORAL | Status: DC

## 2018-04-29 MED ORDER — FUROSEMIDE 10 MG/ML IJ SOLN
80.0000 mg | Freq: Two times a day (BID) | INTRAMUSCULAR | Status: DC
  Administered 2018-04-29 – 2018-05-01 (×5): 80 mg via INTRAVENOUS
  Filled 2018-04-29 (×6): qty 8

## 2018-04-29 MED ORDER — INSULIN ASPART 100 UNIT/ML ~~LOC~~ SOLN
0.0000 [IU] | Freq: Every day | SUBCUTANEOUS | Status: DC
  Administered 2018-05-02 – 2018-05-03 (×2): 2 [IU] via SUBCUTANEOUS

## 2018-04-29 MED ORDER — NITROGLYCERIN IN D5W 200-5 MCG/ML-% IV SOLN
0.0000 ug/min | INTRAVENOUS | Status: DC
Start: 2018-04-29 — End: 2018-04-29
  Administered 2018-04-29 (×2): 10 ug/min via INTRAVENOUS

## 2018-04-29 MED ORDER — FUROSEMIDE 10 MG/ML IJ SOLN
80.0000 mg | Freq: Once | INTRAMUSCULAR | Status: AC
  Administered 2018-04-29: 80 mg via INTRAVENOUS
  Filled 2018-04-29: qty 8

## 2018-04-29 MED ORDER — POLYETHYLENE GLYCOL 3350 17 G PO PACK
17.0000 g | PACK | Freq: Every day | ORAL | Status: DC | PRN
  Administered 2018-05-03: 17 g via ORAL
  Filled 2018-04-29: qty 1

## 2018-04-29 NOTE — ED Provider Notes (Signed)
Seen on arrival. Patient complains of shortness of breath onset upon awakening this morning. He states he went to bed feeling well last night. EMS treated patient with supplemental oxygen. EMS reports his pulse oximetry on room air was 96%.symptoms feel like congestive heart failure that he's had in the past On exam patient is inwrist or distress. Speaks in short phrases. Lungs with rales all the way up bilaterally heart tachycardic regular rhythm abdomen obese, nontender extremities with 1+ pretibial pitting edema bilaterally.skin diaphoretic BiPAP placed on patient. Lasix and intravenous nitroglycerin ordered by me.   8:55 a.m. Patient states his breathing is improved since treatment with BiPAP ED ECG REPORT   Date: 04/29/2018  Rate: 95  Rhythm: normal sinus rhythm  QRS Axis: normal  Intervals: PR prolonged  ST/T Wave abnormalities: nonspecific T wave changes  Conduction Disutrbances:nonspecific intraventricular conduction delay  Narrative Interpretation:   Old EKG Reviewed: unchanged No significant change from 02/16/2018 I have personally reviewed the EKG tracing and agree with the computerized printout as noted.   Doug Sou, MD 04/29/18 [phone]

## 2018-04-29 NOTE — ED Notes (Signed)
Admitting at bedside 

## 2018-04-29 NOTE — ED Provider Notes (Signed)
[name] EMERGENCY DEPARTMENT Provider Note   CSN: [id number] Arrival date & time: 04/29/18  4098     History   Chief Complaint Chief Complaint  Patient presents with  . Respiratory Distress    HPI Ryan Macdonald is a 84 y.o. male with history of A. fib on Coreg, Eliquis, amiodarone, CHF with EF 30% on hydralazine, spironolactone, digoxin, torsemide, HTN, DM is brought to the ER by EMS for sudden onset shortness of breath.  Onset this morning soon after he woke up.  Associated with cough, dry.  States it feels like his heart failure.  States he has been compliant with his medications.  He has been watching his diet closely and denies recent large amounts of salt intake.  States his weight has been around 248.  He denies any fevers, chills, chest pain, palpitations, nausea, vomiting, abdominal pain.  He has lower extremity edema that is chronic, right greater than left.  Denies calf tenderness.    HPI  Past Medical History:  Diagnosis Date  . Cat scratch fever    removed mass on right side neck  . Cataracts, bilateral   . CHF (congestive heart failure) (HCC)   . Diabetes mellitus without complication (HCC)    TYPE 2  . High cholesterol   . Hypertension   . Kidney stones   . Morbid obesity (HCC)   . Nonischemic cardiomyopathy (HCC)    a. ? 2009 Cath in [place] - nl cors per pt;  b. 09/2013 Echo: EF 25-30%, sev diff HK.  . Osteoarthritis   . PAF (paroxysmal atrial fibrillation) (HCC)    a. post-op hip in 2014 - prev on xarelto.  . Renal insufficiency     Patient Active Problem List   Diagnosis Date Noted  . Shortness of breath 04/29/2018  . Acute HFrEF (heart failure with reduced ejection fraction) (HCC) 04/29/2018  . Acute respiratory failure with hypoxia (HCC) 09/27/2017  . CHF exacerbation (HCC) 09/27/2017  . Renal insufficiency   . Acute on chronic systolic CHF (congestive heart failure) (HCC) 07/08/2017  . Left knee pain 07/08/2017  . Acute  bronchitis 05/10/2017  . Acute congestive heart failure (HCC) 04/14/2017  . Elevated troponin 04/14/2017  . Lactic acidosis   . Acute on chronic systolic heart failure, NYHA class 2 (HCC) 02/03/2017  . Diabetes mellitus type 2 in obese (HCC) 02/03/2017  . CAP (community acquired pneumonia) 02/03/2017  . Lobar pneumonia (HCC)   . Diabetes mellitus with complication (HCC)   . Acute on chronic respiratory failure with hypoxia (HCC)   . Acute on chronic combined systolic and diastolic CHF (congestive heart failure) (HCC) 05/19/2016  . Depression 05/19/2016  . Gout 05/19/2016  . 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 NSVT (nonsustained ventricular tachycardia) (HCC)   . Chronic renal insufficiency, stage III (moderate) (HCC)   . Pulmonary embolism (HCC)   . Acute respiratory failure (HCC)   . Osteoarthritis of left hip 10/25/2013  . Atrial fibrillation (HCC) 10/25/2013  . S/P hip replacement 10/24/2013  . Nonischemic cardiomyopathy (HCC)   . Obesity (BMI 30-39.9)   . Essential hypertension   . Hyperlipidemia     Past Surgical History:  Procedure Laterality Date  . CARDIAC CATHETERIZATION  1980's  . CIRCUMCISION  1940's  . CYSTOSCOPY WITH URETHRAL DILATATION  10/23/2013  . CYSTOSCOPY WITH URETHRAL DILATATION N/A 10/23/2013   Procedure: CYSTOSCOPY WITH URETHRAL DILATATION;  Surgeon: Kathi Ludwig, MD;  Location: [facility] OR;  Service: Urology;  Laterality: N/A;  . INCISION AND DRAINAGE OF WOUND Right 1980's   "cat scratch" (10/23/2013)  . INGUINAL HERNIA REPAIR Right 1950's  . KIDNEY STONE SURGERY Right 1970's; 1983   "twice"  . TONSILLECTOMY AND ADENOIDECTOMY  1940's  . TOTAL HIP ARTHROPLASTY Left 10/23/2013   Procedure: TOTAL HIP ARTHROPLASTY;  Surgeon: Valeria Batman, MD;  Location: [facility] OR;  Service: Orthopedics;  Laterality: Left;        Home Medications    Prior to Admission medications   Medication Sig Start Date End Date Taking?  Authorizing Provider  allopurinol (ZYLOPRIM) 100 MG tablet Take 200 mg by mouth daily.   Yes [provider]  amiodarone (PACERONE) 200 MG tablet Take 1 tablet (200 mg total) by mouth 2 (two) times daily. 02/16/18  Yes Santos-Sanchez, Chelsea Primus, MD  apixaban (ELIQUIS) 5 MG TABS tablet Take 1 tablet (5 mg total) by mouth 2 (two) times daily. 02/06/17  Yes Amin, Loura Halt, MD  carvedilol (COREG) 12.5 MG tablet Take 0.5 tablets (6.25 mg total) by mouth 2 (two) times daily with a meal. 04/15/17  Yes Osvaldo Shipper, MD  cyanocobalamin 1000 MCG tablet Take 1,000 mcg by mouth daily.    Yes [provider]  diclofenac sodium (VOLTAREN) 1 % GEL Apply 4 g topically 4 (four) times daily. 07/10/17  Yes Valentino Nose, MD  finasteride (PROSCAR) 5 MG tablet Take 5 mg by mouth daily.   Yes [provider]  glipiZIDE (GLUCOTROL) 5 MG tablet Take 5 mg by mouth daily before breakfast.   Yes [provider]  hydrALAZINE (APRESOLINE) 25 MG tablet Take 0.5 tablets (12.5 mg total) by mouth every 8 (eight) hours. 02/16/18  Yes Burna Cash, MD  HYDROcodone-acetaminophen (NORCO/VICODIN) 5-325 MG tablet Take 2 tablets by mouth every 4 (four) hours as needed. Patient taking differently: Take 1 tablet by mouth 2 (two) times daily as needed for moderate pain.  04/09/16  Yes Isa Rankin, MD  ipratropium-albuterol (DUONEB) 0.5-2.5 (3) MG/3ML SOLN Take 3 mLs by nebulization every 4 (four) hours as needed. 05/14/17  Yes Pearson Grippe, MD  isosorbide dinitrate (DILATRATE-SR) 40 MG CR capsule Take 40 mg by mouth 3 (three) times daily.   Yes [provider]  ketoconazole (NIZORAL) 2 % shampoo Apply 1 application topically 2 (two) times a week.   Yes [provider]  MAGNESIUM PO Take 0.5 tablets by mouth 2 (two) times daily.   Yes [provider]  potassium chloride (K-DUR) 10 MEQ tablet Take 2 tablets (20 mEq total) by mouth daily. 07/10/17 04/29/18 Yes Valentino Nose,  MD  sertraline (ZOLOFT) 25 MG tablet Take 50 mg by mouth daily as needed (for anxiety). Takes 1-2 times per week   Yes [provider]  tamsulosin (FLOMAX) 0.4 MG CAPS capsule Take 0.4 mg by mouth at bedtime.   Yes [provider]  torsemide (DEMADEX) 20 MG tablet Take 2 tablets (40 mg total) by mouth 2 (two) times daily. 02/16/18  Yes Santos-Sanchez, Chelsea Primus, MD  digoxin (LANOXIN) 0.125 MG tablet Take 1 tablet (0.125 mg total) by mouth daily. Patient not taking: Reported on 03/03/2018 02/17/18   Burna Cash, MD    Family History Family History  Problem Relation Age of Onset  . Heart disease Mother   . Heart Problems Maternal Grandmother   . Other Unknown        negative for premature CAD    Social History Social History   Tobacco Use  . Smoking status: Former  Smoker    Packs/day: 1.00    Years: 15.00    Pack years: 15.00    Types: Cigarettes, Cigars  . Smokeless tobacco: Former Neurosurgeon  . Tobacco comment: quit smoking ~ 50 yr ago  Substance Use Topics  . Alcohol use: No    Alcohol/week: 1.2 oz    Types: 2 Cans of beer per week    Comment: rare beer.  . Drug use: No     Allergies   Lisinopril and Xarelto [rivaroxaban]   Review of Systems Review of Systems  Constitutional: Positive for diaphoresis.  Respiratory: Positive for cough and shortness of breath.   Cardiovascular: Positive for leg swelling.  All other systems reviewed and are negative.    Physical Exam Updated Vital Signs BP 116/77   Pulse 73   Temp (S) 99.9 F (37.7 C) (Rectal)   Resp (!) 21   Ht 5\' 7"  (1.702 m)   Wt 111.1 kg (245 lb)   SpO2 98%   BMI 38.37 kg/m   Physical Exam  Constitutional: He is oriented to person, place, and time. He appears well-developed and well-nourished. No distress.  Uncomfortable. Sitting up on bed, diaphoretic, on NRB.   HENT:  Head: Normocephalic and atraumatic.  Nose: Nose normal.  Moist mucous membranes   Eyes: Pupils are equal,  round, and reactive to light. Conjunctivae and EOM are normal.  Neck: Normal range of motion.  Cardiovascular: Normal rate, regular rhythm, normal heart sounds and intact distal pulses.  2+ pitting edema up to knee on RLE. 1+ pitting edema up to knee on LLE. No calf tenderness.   Pulmonary/Chest: He is in respiratory distress. He has wheezes. He has rales.  Moderate respiratory distress on NRB, speaking in 2-3 word sentences. Tachypnic, hypoxic to 85-88% on NRB. Diffuse inspiratory and expiratory crackles and wheezing throughout, decreased breath sounds in lower lobes.   Abdominal: Soft. Bowel sounds are normal. There is no tenderness.  No G/R/R. No suprapubic or CVA tenderness. Negative Murphy's and McBurney's  Musculoskeletal: Normal range of motion.  Neurological: He is alert and oriented to person, place, and time.  Moving all four extremities in coordinated fashion. Speech normal without dysarthria or aphasia.   Skin: Skin is warm and dry. Capillary refill takes less than 2 seconds.  Psychiatric: He has a normal mood and affect. His behavior is normal. Judgment and thought content normal.  Nursing note and vitals reviewed.    ED Treatments / Results  Labs (all labs ordered are listed, but only abnormal results are displayed) Labs Reviewed  CBC WITH DIFFERENTIAL/PLATELET - Abnormal; Notable for the following components:      Result Value   WBC 11.0 (*)    Lymphs Abs 4.4 (*)    All other components within normal limits  BRAIN NATRIURETIC PEPTIDE - Abnormal; Notable for the following components:   B Natriuretic Peptide 447.2 (*)    All other components within normal limits  BASIC METABOLIC PANEL - Abnormal; Notable for the following components:   Glucose, Bld 203 (*)    BUN 24 (*)    Creatinine, Ser 1.60 (*)    GFR calc non Af Amer 39 (*)    GFR calc Af Amer 45 (*)    All other components within normal limits  DIGOXIN LEVEL - Abnormal; Notable for the following components:    Digoxin Level <0.2 (*)    All other components within normal limits  TROPONIN I - Abnormal; Notable for the following components:  Troponin I 0.18 (*)    All other components within normal limits  I-STAT VENOUS BLOOD GAS, ED - Abnormal; Notable for the following components:   pH, Ven 7.156 (*)    pCO2, Ven 61.4 (*)    pO2, Ven 54.0 (*)    Acid-base deficit 9.0 (*)    All other components within normal limits  COMPREHENSIVE METABOLIC PANEL  CBC  PROCALCITONIN  LIPID PANEL  TROPONIN I  I-STAT TROPONIN, ED  CBG MONITORING, ED    EKG None  Radiology Dg Chest Portable 1 View  Result Date: 04/29/2018 CLINICAL DATA:  Respiratory distress and cough for 3 days. EXAM: PORTABLE CHEST 1 VIEW COMPARISON:  02/13/2018. FINDINGS: Airspace opacity is noted throughout most of the left lung, most confluent in the base, increased when compared the prior chest radiograph. There are prominent bronchovascular markings in the right lung base. No convincing pleural effusion.  No pneumothorax. Cardiac silhouette is mildly enlarged. No mediastinal or hilar masses. Left anterior chest wall pacemaker is stable. IMPRESSION: 1. Hazy increased opacity noted throughout most of the left lung, increased from the most recent prior chest radiograph. This may reflect asymmetric edema or pneumonia. Electronically Signed   By: Amie Portland M.D.   On: 04/29/2018 09:11    Procedures .Critical Care Performed by: Liberty Handy, PA-C Authorized by: Liberty Handy, PA-C   Critical care provider statement:    Critical care time (minutes):  30   Critical care was necessary to treat or prevent imminent or life-threatening deterioration of the following conditions:  Respiratory failure   Critical care was time spent personally by me on the following activities:  Examination of patient, evaluation of patient's response to treatment, discussions with consultants, development of treatment plan with patient or surrogate,  obtaining history from patient or surrogate, review of old charts, re-evaluation of patient's condition, pulse oximetry, ordering and review of radiographic studies and ordering and review of laboratory studies   I assumed direction of critical care for this patient from another provider in my specialty: no     (including critical care time)  Medications Ordered in ED Medications  nitroGLYCERIN 50 mg in dextrose 5 % 250 mL (0.2 mg/mL) infusion (10 mcg/min Intravenous Rate/Dose Verify 04/29/18 1818)  polyethylene glycol (MIRALAX / GLYCOLAX) packet 17 g (has no administration in time range)  bisacodyl (DULCOLAX) EC tablet 5 mg (has no administration in time range)  furosemide (LASIX) injection 80 mg (80 mg Intravenous Given 04/29/18 1818)  allopurinol (ZYLOPRIM) tablet 200 mg (has no administration in time range)  amiodarone (PACERONE) tablet 200 mg (has no administration in time range)  apixaban (ELIQUIS) tablet 5 mg (has no administration in time range)  carvedilol (COREG) tablet 6.25 mg (has no administration in time range)  finasteride (PROSCAR) tablet 5 mg (has no administration in time range)  HYDROcodone-acetaminophen (NORCO/VICODIN) 5-325 MG per tablet 1 tablet (has no administration in time range)  ipratropium-albuterol (DUONEB) 0.5-2.5 (3) MG/3ML nebulizer solution 3 mL (has no administration in time range)  tamsulosin (FLOMAX) capsule 0.4 mg (has no administration in time range)  potassium chloride (K-DUR) CR tablet 20 mEq (has no administration in time range)  hydrALAZINE (APRESOLINE) tablet 12.5 mg (has no administration in time range)  isosorbide dinitrate (DILATRATE-SR) capsule 40 mg (has no administration in time range)  insulin aspart (novoLOG) injection 0-15 Units (0 Units Subcutaneous Not Given 04/29/18 1812)  insulin aspart (novoLOG) injection 0-5 Units (has no administration in time range)  furosemide (LASIX) injection  80 mg (80 mg Intravenous Given 04/29/18 0852)     Initial  Impression / Assessment and Plan / ED Course  I have reviewed the triage vital signs and the nursing notes.  Pertinent labs & imaging results that were available during my care of the patient were reviewed by me and considered in my medical decision making (see chart for details).  Clinical Course as of Apr 29 1956  Sat Apr 29, 2018  0916 IMPRESSION: 1. Hazy increased opacity noted throughout most of the left lung, increased from the most recent prior chest radiograph. This may reflect asymmetric edema or pneumonia.    DG Chest Portable 1 View [CG]  0943 WBC(!): 11.0 [CG]  0943 Creatinine(!): 1.60 [CG]  1016 pH, Ven(!!): 7.156 [CG]  1016 pCO2, Ven(!): 61.4 [CG]  1016 pO2, Ven(!): 54.0 [CG]    Clinical Course User Index [CG] Jerrell Mylar    3119: 84 year old here in respiratory distress.  History of heart failure, A. fib, diabetes.  He has had frequent CHF exacerbations in the last 3 months.  Required BiPAP upon arrival.  Reevaluated patient and work of breathing has improved.  Highest on differential is CHF exacerbation versus COPD.  He looks fluid overloaded on exam.  He has no chest pain.  PE lower on differential, he is on Eliquis. No infectious symptoms to suggest PNA.  Final Clinical Impressions(s) / ED Diagnoses   1015: Work-up remarkable for creatinine 1.60, GFR 45, WBC 11.0, venous pH 7.15, PCO2 61.4, PO2 54.0.  Chest x-ray shows increased opacity to left lung increased from previous, this is favored to be edema as he has no infectious symptoms.  Will consult medicine service for admission for acute CHF exacerbation requiring BiPAP.  BNP is pending but I anticipate this will be elevated.  Patient shared with Dr. Ethelda Chick.  Final diagnoses:  Acute respiratory failure with hypercapnia [facility])  Hypoxia    ED Discharge Orders    None       Jerrell Mylar 04/29/18 Brooke Pace    Doug Sou, MD 04/30/18 [phone]

## 2018-04-29 NOTE — ED Notes (Signed)
Respiratory at bedside placing pt on bipap

## 2018-04-29 NOTE — ED Notes (Signed)
Admitting MD made aware of trop of .18. Was told to keep  Monitoring.

## 2018-04-29 NOTE — Progress Notes (Signed)
Called STAT for bipap, noted pt in severe respiratory distress.  Placed pt on bipap and noted immediate relief.  Pt WOB improving. VSS, ED RT aware.

## 2018-04-29 NOTE — H&P (Addendum)
Date: 04/29/2018               Patient Name:  Ryan Macdonald MRN: [id number]  DOB: 08/07/1934 Age / Sex: 84 y.o., male   PCP: [facility]         Medical Service: Internal Medicine Teaching Service         Attending Physician: Dr. Inez Catalina, MD    First Contact: Dr. Frances Furbish Pager: [phone]  Second Contact: Dr. Obie Dredge Pager: [phone]       After Hours (After 5p/  First Contact Pager: [phone]  weekends / holidays): Second Contact Pager: [phone]       Chief Complaint: dyspnea  History of Present Illness: 84 year old male with past medical history of PAF, HFrEF, type 2 diabetes.  Patient arrived in acute heart failure exacerbation, short of breath and hypoxemic.  According to patient he is taking all meds as prescribed.  He denies any dietary indiscretion, but admits to not keeping track of his fluid intake.  He claims that he weighs himself daily and is only up 1 pound.  He denies any orthopnea he says he has the usual amount of swelling in his lower extremities.  Says his blood pressure has been excellent and so is his diabetes.  He does mention a new cough for the last 3 days and feeling congested.  He does not endorse any fever or chills or sick contacts. No nausea, vomiting or diarrhea.  He denies any chest pain.  He never followed up with the advanced heart failure team after his last discharge as he was directed.     ED course: pt given lasix IV 80mg , was placed on bipap,   Pertinent studies:  VBG pH 7.156, PCO2 61.4 Troponin neg Dig level <0.2 BNP 447.2 CXR and ECG interpretation below   Meds:  Current Meds  Medication Sig  . allopurinol (ZYLOPRIM) 100 MG tablet Take 200 mg by mouth daily.  Marland Kitchen amiodarone (PACERONE) 200 MG tablet Take 1 tablet (200 mg total) by mouth 2 (two) times daily.  Marland Kitchen apixaban (ELIQUIS) 5 MG TABS tablet Take 1 tablet (5 mg total) by mouth 2 (two) times daily.  . carvedilol (COREG) 12.5 MG tablet Take 0.5 tablets (6.25 mg total)  by mouth 2 (two) times daily with a meal.  . cyanocobalamin 1000 MCG tablet Take 1,000 mcg by mouth daily.   . diclofenac sodium (VOLTAREN) 1 % GEL Apply 4 g topically 4 (four) times daily.  . finasteride (PROSCAR) 5 MG tablet Take 5 mg by mouth daily.  Marland Kitchen glipiZIDE (GLUCOTROL) 5 MG tablet Take 5 mg by mouth daily before breakfast.  . hydrALAZINE (APRESOLINE) 25 MG tablet Take 0.5 tablets (12.5 mg total) by mouth every 8 (eight) hours.  Marland Kitchen HYDROcodone-acetaminophen (NORCO/VICODIN) 5-325 MG tablet Take 2 tablets by mouth every 4 (four) hours as needed. (Patient taking differently: Take 1 tablet by mouth 2 (two) times daily as needed for moderate pain. )  . ipratropium-albuterol (DUONEB) 0.5-2.5 (3) MG/3ML SOLN Take 3 mLs by nebulization every 4 (four) hours as needed.  . isosorbide dinitrate (DILATRATE-SR) 40 MG CR capsule Take 40 mg by mouth 3 (three) times daily.  Marland Kitchen ketoconazole (NIZORAL) 2 % shampoo Apply 1 application topically 2 (two) times a week.  Marland Kitchen MAGNESIUM PO Take 0.5 tablets by mouth 2 (two) times daily.  . potassium chloride (K-DUR) 10 MEQ tablet Take 2 tablets (20 mEq total) by mouth daily.  . sertraline (ZOLOFT) 25 MG  tablet Take 50 mg by mouth daily as needed (for anxiety). Takes 1-2 times per week  . tamsulosin (FLOMAX) 0.4 MG CAPS capsule Take 0.4 mg by mouth at bedtime.  . torsemide (DEMADEX) 20 MG tablet Take 2 tablets (40 mg total) by mouth 2 (two) times daily.     Allergies: Allergies as of 04/29/2018 - Review Complete 04/29/2018  Allergen Reaction Noted  . Lisinopril Swelling 10/11/2013  . Xarelto [rivaroxaban]  02/13/2018   Past Medical History:  Diagnosis Date  . Cat scratch fever    removed mass on right side neck  . Cataracts, bilateral   . CHF (congestive heart failure) (HCC)   . Diabetes mellitus without complication (HCC)    TYPE 2  . High cholesterol   . Hypertension   . Kidney stones   . Morbid obesity (HCC)   . Nonischemic cardiomyopathy (HCC)    a. ?  2009 Cath in [place] - nl cors per pt;  b. 09/2013 Echo: EF 25-30%, sev diff HK.  . Osteoarthritis   . PAF (paroxysmal atrial fibrillation) (HCC)    a. post-op hip in 2014 - prev on xarelto.  . Renal insufficiency     Family History:  Family History  Problem Relation Age of Onset  . Heart disease Mother   . Heart Problems Maternal Grandmother   . Other Unknown        negative for premature CAD    Social History:  Social History   Socioeconomic History  . Marital status: Married    Spouse name: Not on file  . Number of children: Not on file  . Years of education: Not on file  . Highest education level: Not on file  Occupational History  . Occupation: Retired    Comment: Former Patent examiner  . Financial resource strain: Not on file  . Food insecurity:    Worry: Not on file    Inability: Not on file  . Transportation needs:    Medical: Not on file    Non-medical: Not on file  Tobacco Use  . Smoking status: Former Smoker    Packs/day: 1.00    Years: 15.00    Pack years: 15.00    Types: Cigarettes, Cigars  . Smokeless tobacco: Former Neurosurgeon  . Tobacco comment: quit smoking ~ 50 yr ago  Substance and Sexual Activity  . Alcohol use: No    Alcohol/week: 1.2 oz    Types: 2 Cans of beer per week    Comment: rare beer.  . Drug use: No  . Sexual activity: Yes  Lifestyle  . Physical activity:    Days per week: Not on file    Minutes per session: Not on file  . Stress: Not on file  Relationships  . Social connections:    Talks on phone: Not on file    Gets together: Not on file    Attends religious service: Not on file    Active member of club or organization: Not on file    Attends meetings of clubs or organizations: Not on file    Relationship status: Not on file  . Intimate partner violence:    Fear of current or ex partner: Not on file    Emotionally abused: Not on file    Physically abused: Not on file    Forced sexual activity: Not on file  Other Topics  Concern  . Not on file  Social History Narrative   Lives in [place] with wife. Moved  from [place] (2013). Does not routinely exercise.    Review of Systems: A complete ROS was negative except as per HPI.   Physical Exam: Blood pressure 123/79, pulse 73, temperature (S) 99.9 F (37.7 C), temperature source (S) Rectal, resp. rate (!) 22, height 5\' 7"  (1.702 m), weight 245 lb (111.1 kg), SpO2 100 %. Physical Exam  Constitutional: He appears well-developed and well-nourished.  HENT:  Head: Normocephalic and atraumatic.  Eyes: Right eye exhibits no discharge. Left eye exhibits no discharge. No scleral icterus.  Cardiovascular: Normal rate, regular rhythm and intact distal pulses. Exam reveals distant heart sounds. Exam reveals no gallop and no friction rub.  No murmur heard. Pulmonary/Chest: Effort normal. No respiratory distress. He has no wheezes. He has rales (RLL).  Pt on bpap, coarse breath sounds heard in LLF, LMF an RMF  Abdominal: Soft. Bowel sounds are normal. He exhibits no distension and no mass. There is no tenderness. There is no guarding.  Musculoskeletal: He exhibits edema (2+ pittinge LE edema bilaterally).  Neurological: He is alert.  Skin: Skin is warm and dry.  Psychiatric: He has a normal mood and affect.    EKG: personally reviewed my interpretation is none available  CXR: personally reviewed my interpretation is left lung opacification > in Lower lobe  Assessment & Plan by Problem: Active Problems:   Shortness of breath  Acute on chronic HFrEF: pt denies any changes, however per chart review  wife corroborated last admission that there is indeed dietary indiscretion and non compliance.  Weight does appear to be stable on review.  Chest x-ray and exam concerning for pulm edema however breath sounds suggest possibility of lung disease and recent productive cough.    -lasix 80mg  BID -bpap PRN -initial cardiac monitoring -procalcitonin to rule out a L sided  pneumonia -continue hydral 12.5mg  TID and isosorbide dinitrate 40mg  TID -ECHO  PAF: pt on amiodarone 200mg  BID, carvedilol 6.25mg  BID, digoxin was d/ced  -continue home meds  T2DM: pt only on glipizide 5mg  daily  -SSI moderate  CKD 3: pts creatinine mildly elevated from baseline  -continue to monitor  BPH: on finasteride and tamsulosin at home  -continue finasteride and tamsulosin Dispo: Admit patient to Inpatient with expected length of stay greater than 2 midnights.  Signed: Angelita Ingles, MD 04/29/2018, 11:11 AM  Thornell Mule MD PGY-1 Internal Medicine Pager # [phone]

## 2018-04-29 NOTE — ED Triage Notes (Signed)
Per [facility] pt from home with respiratory distress and cough x3 days. Hx of CHF and HTN. No meds taken today. Pt labored, cold, clammy and on nonrebreather. MD notified and brought to bedside. Respiratory called for bipap.

## 2018-04-30 ENCOUNTER — Other Ambulatory Visit: Payer: Self-pay

## 2018-04-30 ENCOUNTER — Inpatient Hospital Stay (HOSPITAL_COMMUNITY): Payer: Non-veteran care

## 2018-04-30 DIAGNOSIS — Z87891 Personal history of nicotine dependence: Secondary | ICD-10-CM

## 2018-04-30 DIAGNOSIS — I340 Nonrheumatic mitral (valve) insufficiency: Secondary | ICD-10-CM

## 2018-04-30 DIAGNOSIS — I480 Paroxysmal atrial fibrillation: Secondary | ICD-10-CM

## 2018-04-30 DIAGNOSIS — I5023 Acute on chronic systolic (congestive) heart failure: Secondary | ICD-10-CM

## 2018-04-30 DIAGNOSIS — E1122 Type 2 diabetes mellitus with diabetic chronic kidney disease: Secondary | ICD-10-CM

## 2018-04-30 DIAGNOSIS — Z8249 Family history of ischemic heart disease and other diseases of the circulatory system: Secondary | ICD-10-CM

## 2018-04-30 DIAGNOSIS — N400 Enlarged prostate without lower urinary tract symptoms: Secondary | ICD-10-CM

## 2018-04-30 DIAGNOSIS — R0600 Dyspnea, unspecified: Secondary | ICD-10-CM

## 2018-04-30 DIAGNOSIS — Z888 Allergy status to other drugs, medicaments and biological substances status: Secondary | ICD-10-CM

## 2018-04-30 DIAGNOSIS — Z79899 Other long term (current) drug therapy: Secondary | ICD-10-CM

## 2018-04-30 DIAGNOSIS — Z7984 Long term (current) use of oral hypoglycemic drugs: Secondary | ICD-10-CM

## 2018-04-30 DIAGNOSIS — N183 Chronic kidney disease, stage 3 (moderate): Secondary | ICD-10-CM

## 2018-04-30 DIAGNOSIS — Z9111 Patient's noncompliance with dietary regimen: Secondary | ICD-10-CM

## 2018-04-30 LAB — CBC
HCT: 39.7 % (ref 39.0–52.0)
Hemoglobin: 12.4 g/dL — ABNORMAL LOW (ref 13.0–17.0)
MCH: 29.5 pg (ref 26.0–34.0)
MCHC: 31.2 g/dL (ref 30.0–36.0)
MCV: 94.5 fL (ref 78.0–100.0)
Platelets: 221 10*3/uL (ref 150–400)
RBC: 4.2 MIL/uL — ABNORMAL LOW (ref 4.22–5.81)
RDW: 14.9 % (ref 11.5–15.5)
WBC: 9.9 10*3/uL (ref 4.0–10.5)

## 2018-04-30 LAB — LIPID PANEL
CHOLESTEROL: 196 mg/dL (ref 0–200)
HDL: 32 mg/dL — ABNORMAL LOW (ref >40)
LDL Cholesterol: 141 mg/dL — ABNORMAL HIGH (ref 0–99)
TRIGLYCERIDES: 116 mg/dL (ref <150)
Total CHOL/HDL Ratio: 6.1 RATIO
VLDL: 23 mg/dL (ref 0–40)

## 2018-04-30 LAB — COMPREHENSIVE METABOLIC PANEL
ALBUMIN: 3 g/dL — AB (ref 3.5–5.0)
ALK PHOS: 44 U/L (ref 38–126)
ALT: 25 U/L (ref 17–63)
AST: 33 U/L (ref 15–41)
Anion gap: 9 (ref 5–15)
BILIRUBIN TOTAL: 0.7 mg/dL (ref 0.3–1.2)
BUN: 21 mg/dL — ABNORMAL HIGH (ref 6–20)
CALCIUM: 8.8 mg/dL — AB (ref 8.9–10.3)
CO2: 31 mmol/L (ref 22–32)
Chloride: 103 mmol/L (ref 101–111)
Creatinine, Ser: 1.55 mg/dL — ABNORMAL HIGH (ref 0.61–1.24)
GFR calc Af Amer: 47 mL/min — ABNORMAL LOW (ref >60)
GFR, EST NON AFRICAN AMERICAN: 40 mL/min — AB (ref >60)
GLUCOSE: 159 mg/dL — AB (ref 65–99)
Potassium: 3.7 mmol/L (ref 3.5–5.1)
Sodium: 143 mmol/L (ref 135–145)
TOTAL PROTEIN: 6.9 g/dL (ref 6.5–8.1)

## 2018-04-30 LAB — ECHOCARDIOGRAM LIMITED
HEIGHTINCHES: 67 in
WEIGHTICAEL: 3968 [oz_av]

## 2018-04-30 LAB — GLUCOSE, CAPILLARY
GLUCOSE-CAPILLARY: 121 mg/dL — AB (ref 65–99)
GLUCOSE-CAPILLARY: 112 mg/dL — AB (ref 65–99)
GLUCOSE-CAPILLARY: 116 mg/dL — AB (ref 65–99)
Glucose-Capillary: 124 mg/dL — ABNORMAL HIGH (ref 65–99)

## 2018-04-30 LAB — TROPONIN I: Troponin I: 0.17 ng/mL (ref <0.03)

## 2018-04-30 LAB — PROCALCITONIN: PROCALCITONIN: 0.32 ng/mL

## 2018-04-30 MED ORDER — LIDOCAINE 5 % EX PTCH
1.0000 | MEDICATED_PATCH | CUTANEOUS | Status: DC
  Administered 2018-04-30 – 2018-05-03 (×4): 1 via TRANSDERMAL
  Filled 2018-04-30 (×4): qty 1

## 2018-04-30 MED ORDER — OXYCODONE HCL 5 MG PO TABS
5.0000 mg | ORAL_TABLET | Freq: Once | ORAL | Status: AC
  Administered 2018-04-30: 5 mg via ORAL
  Filled 2018-04-30: qty 1

## 2018-04-30 MED ORDER — DICLOFENAC SODIUM 1 % TD GEL
2.0000 g | Freq: Two times a day (BID) | TRANSDERMAL | Status: DC | PRN
  Administered 2018-04-30 – 2018-05-03 (×5): 2 g via TOPICAL
  Filled 2018-04-30: qty 100

## 2018-04-30 MED ORDER — DOXYCYCLINE HYCLATE 100 MG PO TABS
200.0000 mg | ORAL_TABLET | Freq: Every day | ORAL | Status: AC
  Administered 2018-04-30 – 2018-05-04 (×5): 200 mg via ORAL
  Filled 2018-04-30 (×5): qty 2

## 2018-04-30 MED ORDER — ACETAMINOPHEN 325 MG PO TABS: 650.0000 mg | ORAL_TABLET | Freq: Four times a day (QID) | ORAL | Status: DC | PRN

## 2018-04-30 MED ORDER — ROSUVASTATIN CALCIUM 20 MG PO TABS
20.0000 mg | ORAL_TABLET | Freq: Every day | ORAL | Status: DC
  Administered 2018-04-30 – 2018-05-04 (×5): 20 mg via ORAL
  Filled 2018-04-30 (×5): qty 1

## 2018-04-30 NOTE — Progress Notes (Signed)
  Echocardiogram  has been performed.  Belva Chimes 04/30/2018, 3:15 PM

## 2018-04-30 NOTE — Discharge Summary (Signed)
Name: Ryan Macdonald MRN: [id number] DOB: 1934/04/16 84 y.o. PCP: [facility]  Date of Admission: 04/29/2018  8:10 AM Date of Discharge:  Attending Physician: Levert Feinstein, MD  Discharge Diagnosis: 1.  Active Problems:   Chronic renal insufficiency, stage 3 (moderate) (HCC)   Acute on chronic combined systolic and diastolic heart failure (HCC)   Shortness of breath   Acute HFrEF (heart failure with reduced ejection fraction) (HCC)   First degree AV block   Discharge Medications: Allergies as of 05/04/2018      Reactions   Lisinopril Swelling   Facial swelling   Xarelto [rivaroxaban]    bleeding      Medication List    STOP taking these medications   digoxin 0.125 MG tablet Commonly known as:  LANOXIN   potassium chloride 10 MEQ tablet Commonly known as:  K-DUR     TAKE these medications   allopurinol 100 MG tablet Commonly known as:  ZYLOPRIM Take 200 mg by mouth daily.   amiodarone 200 MG tablet Commonly known as:  PACERONE Take 1 tablet (200 mg total) by mouth 2 (two) times daily.   apixaban 2.5 MG Tabs tablet Commonly known as:  ELIQUIS Take 1 tablet (2.5 mg total) by mouth 2 (two) times daily. What changed:    medication strength  how much to take   carvedilol 12.5 MG tablet Commonly known as:  COREG Take 0.5 tablets (6.25 mg total) by mouth 2 (two) times daily with a meal.   cyanocobalamin 1000 MCG tablet Take 1,000 mcg by mouth daily.   diclofenac sodium 1 % Gel Commonly known as:  VOLTAREN Apply 4 g topically 4 (four) times daily.   finasteride 5 MG tablet Commonly known as:  PROSCAR Take 5 mg by mouth daily.   fluticasone furoate-vilanterol 100-25 MCG/INH Aepb Commonly known as:  BREO ELLIPTA Inhale 1 puff into the lungs daily. Start taking on:  05/05/2018   glipiZIDE 5 MG tablet Commonly known as:  GLUCOTROL Take 5 mg by mouth daily before breakfast.   hydrALAZINE 25 MG tablet Commonly known as:   APRESOLINE Take 0.5 tablets (12.5 mg total) by mouth every 8 (eight) hours.   HYDROcodone-acetaminophen 5-325 MG tablet Commonly known as:  NORCO/VICODIN Take 2 tablets by mouth every 4 (four) hours as needed. What changed:    how much to take  when to take this  reasons to take this   ipratropium-albuterol 0.5-2.5 (3) MG/3ML Soln Commonly known as:  DUONEB Take 3 mLs by nebulization every 4 (four) hours as needed.   isosorbide dinitrate 40 MG CR capsule Commonly known as:  DILATRATE-SR Take 40 mg by mouth 3 (three) times daily.   ketoconazole 2 % shampoo Commonly known as:  NIZORAL Apply 1 application topically 2 (two) times a week.   MAGNESIUM PO Take 0.5 tablets by mouth 2 (two) times daily.   rosuvastatin 20 MG tablet Commonly known as:  CRESTOR Take 1 tablet (20 mg total) by mouth daily at 6 PM.   sertraline 25 MG tablet Commonly known as:  ZOLOFT Take 50 mg by mouth daily as needed (for anxiety). Takes 1-2 times per week   tamsulosin 0.4 MG Caps capsule Commonly known as:  FLOMAX Take 0.4 mg by mouth at bedtime.   torsemide 20 MG tablet Commonly known as:  DEMADEX Take one 20mg  tablet daily.  However, if weight up 3lbs in 24 hours take an extra dose that day.  If weight is below your  dry weight skip your dose that day. What changed:    how much to take  how to take this  when to take this  additional instructions       Disposition and follow-up:   Ryan Macdonald was discharged from [facility] in stable condition.  At the hospital follow up visit please address:  HFrEF  -Lisinopril allergy unsure about ARB tolerance pt was not certain would prefer arb vs bidil if able -continue to adjust diuretic dose -digoxin d/ced before his arrival consider restarting -EF now lower, G2DD, see ECHO below  Obstructive vs mixed Lung disease  -wheezing and hypoxemia appreciated during stay reveresed with bronchodilator therapy -majority of  hypoxemia was from HFrEF but also an element of OLD -consider pfts and if indicated HRCT for amiodarone toxicity  ?OSA  -could benefit from sleep study and cpap  CKD  -monitor renal function seems to be declining with new decline in EF  Gout  -pt benefited from steroid injection could consider additional in left ankle  2.  Labs / imaging needed at time of follow-up: bmp, uric acid level  3.  Pending labs/ test needing follow-up: none  Follow-up Appointments: Follow-up Information    [facility] Follow up in 1 week(s).   Specialty:  General Practice Why:  Please follow up with Dr. Tereasa Coop first thing Monday morning Contact information: [address] [place] [address] [phone]        Laurey Morale, MD. Schedule an appointment as soon as possible for a visit.   Specialty:  Cardiology Why:  make appointment as soon as approval from [place] granted Contact information: [address] [place] [address] [phone]           Hospital Course by problem list: Active Problems:   Chronic renal insufficiency, stage 3 (moderate) (HCC)   Acute on chronic combined systolic and diastolic heart failure (HCC)   Shortness of breath   Acute HFrEF (heart failure with reduced ejection fraction) (HCC)   First degree AV block   Patient arrived in acute heart failure exacerbation his weight to be about 8 pounds up from his baseline around 242.  He initially had an increased oxygen requirement as well as some respiratory acidosis with mild hypercarbia.  He required BiPAP initially his chest x-ray showed pulmonary edema asymmetrically worse on the left side.Marland Kitchen  He was started on IV Lasix 80 mg twice daily he diuresed very well.  His chest x-ray repeat was clear he was able to wean first to nasal cannula oxygen and then to room air successfully.  He was switched to oral torsemide initially 40 mg daily but he was diuresing too well on this dose which was only half of his  home dose.  Therefore he was given a diuretic holiday the following day and he was discharged on 20 mg of torsemide daily close follow-up for repeat labs.  In addition he received benefit from bronchodilator therapy while in the hospital he initially had wheezing and his VBG was concerning for CO2 retention.  His gout flared with diuresis and initially was unable to work with physical therapy.  We tested the inflamed right knee with arthrocentesis and confirmed it to be gouty arthritis, we simultaneously gave a Kenalog lidocaine injection into the knee with good result.  The following day he was able to work with physical therapy and no longer had an oxygen requirement.  At this point the patient was stable for discharge  to follow-up with his primary care provider.   Discharge Vitals:   BP 126/83 (BP Location: Right Arm)   Pulse 63   Temp 98.6 F (37 C) (Oral)   Resp 12   Ht 5\' 7"  (1.702 m)   Wt 246 lb 0.5 oz (111.6 kg)   SpO2 94%   BMI 38.53 kg/m   Pertinent Labs, Studies, and Procedures:  CBC Latest Ref Rng & Units 04/30/2018 04/29/2018 02/14/2018  WBC 4.0 - 10.5 K/uL 9.9 11.0(H) 7.7  Hemoglobin 13.0 - 17.0 g/dL 12.4(L) 14.3 10.6(L)  Hematocrit 39.0 - 52.0 % 39.7 45.2 33.3(L)  Platelets 150 - 400 K/uL 221 265 228   BMP Latest Ref Rng & Units 05/04/2018 05/03/2018 05/02/2018  Glucose 65 - 99 mg/dL 161(W) 960(A) 540(J)  BUN 6 - 20 mg/dL 81(X) 91(Y) 78(G)  Creatinine 0.61 - 1.24 mg/dL 9.56(O) 1.30(Q) 6.57(Q)  Sodium 135 - 145 mmol/L 136 136 139  Potassium 3.5 - 5.1 mmol/L 4.6 4.2 4.0  Chloride 101 - 111 mmol/L 100(L) 98(L) 97(L)  CO2 22 - 32 mmol/L 28 27 30   Calcium 8.9 - 10.3 mg/dL 9.0 4.6(N) 8.9   Study Result   Result status: Final result                               *[place]*                   *[name]*                         [address]                        [place], [place] [address]                            [phone]    ------------------------------------------------------------------- Transthoracic Echocardiography   Patient:    Ryan Macdonald, Ryan Macdonald MR #:       [id number] Study Date: 04/30/2018 Gender:     M Age:        83 Height:     170.2 cm Weight:     112.5 kg BSA:        2.36 m^2 Pt. Status: Room:       3E13C    ATTENDING    Doug Sou [id number]  ADMITTING    Ellan Lambert  PERFORMING   [facility], Inpatient  REFERRING    Chana Bode  SONOGRAPHER  Toniann Fail Stanford   cc:   ------------------------------------------------------------------- LV EF: 20% -   25%   ------------------------------------------------------------------- History:   PMH:  Acute Respiratory Failure with hypercapnea, Hypoxia, Shortness of Breath, Non-Ischemic Cardiomyopathy, Obesity, Pulmonary Embolism, NSVT, Chronic Renal Insufficiency, Diabetes II, Increased Troponins. Congestive Heart Failure.   ------------------------------------------------------------------- Study Conclusions   - Left ventricle: The cavity size was moderately dilated. Wall   thickness was normal. Systolic function was severely reduced. The   estimated ejection fraction was in the range of 20% to 25%.   Diffuse hypokinesis. Features are consistent with a pseudonormal   left ventricular filling pattern, with concomitant abnormal   relaxation and increased filling pressure (grade 2 diastolic   dysfunction). Doppler parameters are consistent with high   ventricular filling pressure. -  Aortic valve: There was trivial regurgitation. - Mitral valve: There was mild regurgitation. Valve area by   pressure half-time: 1.09 cm^2. - Left atrium: The atrium was mildly dilated. - Right ventricle: Pacer wire or catheter noted in right ventricle.   Systolic function was reduced.   ------------------------------------------------------------------- Study data:  Comparison was made to the study  of 02/16/2018. Procedure:  Transthoracic echocardiography. Image quality was adequate.  Study completion:  There were no complications. Transthoracic echocardiography.  M-mode, limited 2D, limited spectral Doppler, and color Doppler.  Birthdate:  Patient birthdate: 17-Aug-1934.  Age:  Patient is 84 yr old.  Sex:  Gender: male.    BMI: 38.8 kg/m^2.  Blood pressure:     117/69  Patient status:  Inpatient.  Study date:  Study date: 04/30/2018. Study time: 03:00 PM.  Location:  Bedside.   -------------------------------------------------------------------   ------------------------------------------------------------------- Left ventricle:  The cavity size was moderately dilated. Wall thickness was normal. Systolic function was severely reduced. The estimated ejection fraction was in the range of 20% to 25%. Diffuse hypokinesis. Features are consistent with a pseudonormal left ventricular filling pattern, with concomitant abnormal relaxation and increased filling pressure (grade 2 diastolic dysfunction). Doppler parameters are consistent with high ventricular filling pressure.   ------------------------------------------------------------------- Aortic valve:  Not visualized.  Doppler:  There was trivial regurgitation.   ------------------------------------------------------------------- Aorta:  Aortic root: The aortic root was normal in size.   ------------------------------------------------------------------- Mitral valve:   Normal thickness leaflets .  Doppler:  There was mild regurgitation.    Valve area by pressure half-time: 1.09 cm^2. Indexed valve area by pressure half-time: 0.46 cm^2/m^2.    Peak gradient (D): 4 mm Hg.   ------------------------------------------------------------------- Left atrium:  The atrium was mildly dilated.   ------------------------------------------------------------------- Right ventricle:  The cavity size was normal. Pacer wire or catheter  noted in right ventricle. Systolic function was reduced.   ------------------------------------------------------------------- Pulmonic valve:   Not visualized.   ------------------------------------------------------------------- Tricuspid valve:   The valve appears to be grossly normal.   ------------------------------------------------------------------- Right atrium:  The atrium was normal in size.   ------------------------------------------------------------------- Pericardium:  There was no pericardial effusion.   ------------------------------------------------------------------- Measurements    Left ventricle                            Value          Reference  LV ID, ED, PLAX chordal         (H)       64    mm       43 - 52  LV ID, ES, PLAX chordal         (H)       55    mm       23 - 38  LV fx shortening, PLAX chordal  (L)       14    %        >=29  LV PW thickness, ED                       8     mm       ---------  IVS/LV PW ratio, ED                       1.25           <=1.3  LV ejection fraction, 1-p A4C  23    %        ---------  LV end-diastolic volume, 2-p              222   ml       ---------  LV end-systolic volume, 2-p               161   ml       ---------  LV ejection fraction, 2-p                 28    %        ---------  Stroke volume, 2-p                        61    ml       ---------  LV end-diastolic volume/bsa,              94    ml/m^2   ---------  2-p  LV end-systolic volume/bsa, 2-p           68    ml/m^2   ---------  Stroke volume/bsa, 2-p                    25.9  ml/m^2   ---------  LV e&', lateral                            6.2   cm/s     ---------  LV E/e&', lateral                          15.26          ---------    Ventricular septum                        Value          Reference  IVS thickness, ED                         10    mm       ---------    LVOT                                      Value          Reference  LVOT ID,  S                                23    mm       ---------  LVOT area                                 4.15  cm^2     ---------    Aorta                                     Value          Reference  Aortic root ID, ED  34    mm       ---------    Left atrium                               Value          Reference  LA ID, A-P, ES                            32    mm       ---------  LA ID/bsa, A-P                            1.36  cm/m^2   <=2.2    Mitral valve                              Value          Reference  Mitral E-wave peak velocity               94.6  cm/s     ---------  Mitral A-wave peak velocity               47.1  cm/s     ---------  Mitral deceleration time        (L)       141   ms       150 - 230  Mitral pressure half-time                 201   ms       ---------  Mitral peak gradient, D                   4     mm Hg    ---------  Mitral E/A ratio, peak                    2              ---------  Mitral valve area, PHT, DP                1.09  cm^2     ---------  Mitral valve area/bsa, PHT, DP            0.46  cm^2/m^2 ---------   Legend: (L)  and  (H)  mark values outside specified reference range.   ------------------------------------------------------------------- Prepared and Electronically Authenticated by   Prentice Docker, MD 2019-06-02T17:21:51    Discharge Instructions: Discharge Instructions    (HEART FAILURE PATIENTS) Call MD:  Anytime you have any of the following symptoms: 1) 3 pound weight gain in 24 hours or 5 pounds in 1 week 2) shortness of breath, with or without a dry hacking cough 3) swelling in the hands, feet or stomach 4) if you have to sleep on extra pillows at night in order to breathe.   Complete by:  As directed    Call MD for:   Complete by:  As directed    Call MD for:  difficulty breathing, headache or visual disturbances   Complete by:  As directed    Call MD for:  extreme fatigue   Complete by:  As directed      Call MD for:  hives   Complete by:  As directed  Call MD for:  persistant dizziness or light-headedness   Complete by:  As directed    Call MD for:  persistant nausea and vomiting   Complete by:  As directed    Call MD for:  redness, tenderness, or signs of infection (pain, swelling, redness, odor or green/yellow discharge around incision site)   Complete by:  As directed    Call MD for:  severe uncontrolled pain   Complete by:  As directed    Call MD for:  temperature >100.4   Complete by:  As directed    Diet - low sodium heart healthy   Complete by:  As directed    Discharge instructions   Complete by:  As directed    Ryan Macdonald, when you wake up tomorrow take your weight on your scale and write this number down this will be your "dry weight".  Tomorrow you can start the lower dose 20mg  of torsemide daily.  If you see your weight go up by 3lbs the following day take an extra dose of your torsemide as written on your Rx.  Please continue to use your duonebs for wheezing until you get your symbicort inhaler.  You have an appointment with Dr. Tereasa Coop on the 14th that will be important to make.  You will need to have your renal function checked at that time and discuss with her the results of your Echocardiogram, your EF is now 20-25% with grade 2 diastolic dysfunction.  Please discuss this with your doctor.  We have also decreased your dose of eliquis to 2.5mg  twice daily due to your decline in renal function so take it this way from now on.  I will not put instructions for your diet and fluid intake as we have been over this extensively.  Your doctor will need to evaluate your lungs for possible obstructive airway disease and you will need an evaluation for obstructive sleep apnea.  Also discuss whether your doctor would like you to restart digoxin or not.   Increase activity slowly   Complete by:  As directed       Signed: Angelita Ingles, MD 05/04/2018, 4:19 PM

## 2018-04-30 NOTE — Progress Notes (Signed)
   Subjective: pt feeling much better today, not on bipap, not feeling SOB.  No chest pain or discomfort.  Objective:  Vital signs in last 24 hours: Vitals:   04/29/18 2234 04/29/18 2300 04/29/18 2354 04/30/18 0340  BP: 138/76  110/67 (!) 99/52  Pulse: 74  70 70  Resp:   (!) 21 18  Temp: 98.2 F (36.8 C)  98.1 F (36.7 C) 98 F (36.7 C)  TempSrc: Oral  Oral Oral  SpO2: 98% 98% 95% 98%  Weight:      Height:       Physical Exam  Constitutional: He appears well-developed and well-nourished.  HENT:  Head: Normocephalic and atraumatic.  Eyes: Right eye exhibits no discharge. Left eye exhibits no discharge. No scleral icterus.  Cardiovascular: Normal rate, regular rhythm and intact distal pulses. Exam reveals distant heart sounds. Exam reveals no gallop and no friction rub.  No murmur heard. Pulmonary/Chest: Effort normal. No respiratory distress. He has no wheezes. He no longer has rales (RLL have now resolved).  coarse breath sounds and expiratory wheeze heard in LLF, LMF an RMF  Abdominal: Soft. Bowel sounds are normal. He exhibits no distension and no mass. There is no tenderness. There is no guarding.  Musculoskeletal: He exhibits edema (1+ pitting LE edema bilaterally improved from day prior).  Neurological: He is alert.  Skin: Skin is warm and dry.  Psychiatric: He has a normal mood and affect   Assessment/Plan:  Active Problems:   Shortness of breath   Acute HFrEF (heart failure with reduced ejection fraction) (HCC)  Acute on chronic HFrEF: pt denies any changes, however per chart review  wife corroborated last admission that there is indeed dietary indiscretion and non compliance.  Weight does appear to be stable on review.  Chest x-ray and exam concerning for pulm edema however breath sounds suggest possibility of lung disease and recent productive cough.  Procalcitonin returned with possibility of infection exacerbating CHF   -lasix 80mg  BID -transitioned to nasal  cannula, will transfer to cardiac floor,  -continue hydral 12.5mg  TID and isosorbide dinitrate 40mg  TID -ECHO pending  ?COPD: pts exam and studies are concerning for underlying obstructive lung disease, pt will deny but has Duoneb on home med list.  I suspect that there may be exacerbation contributing to his dyspnea  -duoneb Q4PRN ordered -will cover pt with doxycyline in lieu of azithro to avoid QT prolongation   PAF: pt on amiodarone 200mg  BID, carvedilol 6.25mg  BID, digoxin was d/ced   -continue home meds   T2DM: pt only on glipizide 5mg  daily   -SSI moderate  CKD 3: pts creatinine mildly elevated from baseline   -creatinine improving with diuresis   BPH: on finasteride and tamsulosin at home   -continue finasteride and tamsulosin   Dispo: Anticipated discharge in approximately 1-2 day(s).   Angelita Ingles, MD 04/30/2018, 4:47 AM Thornell Mule MD PGY-1 Internal Medicine Pager # [phone]

## 2018-04-30 NOTE — Progress Notes (Addendum)
New Admission Note:   Assessment: Completed Skin: Intact Pain:0/10 Tubes: None Safety Measures: Safety Fall Prevention Plan has been discussed  Admission:  [facility] Orientation: Patient has been oriented to the room, unit and staff.  Family: none at bedside  Orders to be reviewed and implemented. Will continue to monitor the patient. Call light has been placed within reach and bed alarm has been activated.   Patient has home medications with him(not narcotic). Educated about P&P however patient refused sending medications to the pharmacy, will send medications home with wife.   Vasiliy Mccarry, RN Phone: [phone]

## 2018-05-01 DIAGNOSIS — I130 Hypertensive heart and chronic kidney disease with heart failure and stage 1 through stage 4 chronic kidney disease, or unspecified chronic kidney disease: Principal | ICD-10-CM

## 2018-05-01 DIAGNOSIS — I440 Atrioventricular block, first degree: Secondary | ICD-10-CM

## 2018-05-01 DIAGNOSIS — R0989 Other specified symptoms and signs involving the circulatory and respiratory systems: Secondary | ICD-10-CM

## 2018-05-01 DIAGNOSIS — M109 Gout, unspecified: Secondary | ICD-10-CM

## 2018-05-01 DIAGNOSIS — Z9119 Patient's noncompliance with other medical treatment and regimen: Secondary | ICD-10-CM

## 2018-05-01 DIAGNOSIS — I428 Other cardiomyopathies: Secondary | ICD-10-CM

## 2018-05-01 DIAGNOSIS — J988 Other specified respiratory disorders: Secondary | ICD-10-CM

## 2018-05-01 DIAGNOSIS — M549 Dorsalgia, unspecified: Secondary | ICD-10-CM

## 2018-05-01 LAB — BASIC METABOLIC PANEL
Anion gap: 7 (ref 5–15)
BUN: 21 mg/dL — AB (ref 6–20)
CALCIUM: 8.5 mg/dL — AB (ref 8.9–10.3)
CO2: 30 mmol/L (ref 22–32)
Chloride: 102 mmol/L (ref 101–111)
Creatinine, Ser: 1.45 mg/dL — ABNORMAL HIGH (ref 0.61–1.24)
GFR calc Af Amer: 51 mL/min — ABNORMAL LOW (ref >60)
GFR, EST NON AFRICAN AMERICAN: 44 mL/min — AB (ref >60)
GLUCOSE: 128 mg/dL — AB (ref 65–99)
Potassium: 4 mmol/L (ref 3.5–5.1)
Sodium: 139 mmol/L (ref 135–145)

## 2018-05-01 LAB — GLUCOSE, CAPILLARY
GLUCOSE-CAPILLARY: 130 mg/dL — AB (ref 65–99)
GLUCOSE-CAPILLARY: 117 mg/dL — AB (ref 65–99)
GLUCOSE-CAPILLARY: 143 mg/dL — AB (ref 65–99)
Glucose-Capillary: 120 mg/dL — ABNORMAL HIGH (ref 65–99)

## 2018-05-01 MED ORDER — IPRATROPIUM-ALBUTEROL 0.5-2.5 (3) MG/3ML IN SOLN
3.0000 mL | Freq: Three times a day (TID) | RESPIRATORY_TRACT | Status: DC
  Filled 2018-05-01: qty 3

## 2018-05-01 MED ORDER — IPRATROPIUM-ALBUTEROL 0.5-2.5 (3) MG/3ML IN SOLN
3.0000 mL | Freq: Three times a day (TID) | RESPIRATORY_TRACT | Status: DC
  Administered 2018-05-01 – 2018-05-02 (×2): 3 mL via RESPIRATORY_TRACT
  Filled 2018-05-01: qty 3

## 2018-05-01 MED ORDER — IPRATROPIUM-ALBUTEROL 0.5-2.5 (3) MG/3ML IN SOLN
3.0000 mL | Freq: Three times a day (TID) | RESPIRATORY_TRACT | Status: DC
  Administered 2018-05-01: 3 mL via RESPIRATORY_TRACT
  Filled 2018-05-01: qty 3

## 2018-05-01 MED ORDER — OXYCODONE HCL 5 MG PO TABS
5.0000 mg | ORAL_TABLET | Freq: Every day | ORAL | Status: DC | PRN
  Administered 2018-05-01: 5 mg via ORAL
  Filled 2018-05-01: qty 1

## 2018-05-01 NOTE — Progress Notes (Signed)
Pharmacist Heart Failure Core Measure Documentation  Assessment: Ryan Macdonald has an EF documented as 20-25% on 04/30/18 by ECHO.  Rationale: Heart failure patients with left ventricular systolic dysfunction (LVSD) and an EF < 40% should be prescribed an angiotensin converting enzyme inhibitor (ACEI) or angiotensin receptor blocker (ARB) at discharge unless a contraindication is documented in the medical record.  This patient is not currently on an ACEI or ARB for HF.  This note is being placed in the record in order to provide documentation that a contraindication to the use of these agents is present for this encounter.  ACE Inhibitor or Angiotensin Receptor Blocker is contraindicated (specify all that apply)  [x]   ACEI allergy AND ARB allergy []   Angioedema []   Moderate or severe aortic stenosis []   Hyperkalemia []   Hypotension []   Renal artery stenosis [x]   Worsening renal function, preexisting renal disease or dysfunction  Pt reports facial swelling with ACEi.  Has not been challenged with ARB.  Brandon Melnick 05/01/2018 10:35 AM

## 2018-05-01 NOTE — Progress Notes (Signed)
Medicine attending: I personally examined this patient today together with resident physician Dr. Chana Bode and reviewed all pertinent clinical, laboratory, and imaging data, and I concur with his evaluation and management plan which we discussed together.  84 year old man with known chronic systolic heart failure ejection fraction 30-35% on 8 February 16, 2018 echocardiogram, paroxysmal atrial fibrillation, nonischemic cardiomyopathy, hypertension, diabetes, and stage III chronic renal insufficiency admitted with an acute exacerbation of systolic heart failure on June 1.  He stated he was compliant with his medications.  Only known recent change was discontinuation of digoxin by his cardiologist.  There may have been some dietary indiscretions.  He did not appear to be actively infected.  He did have an elevation of his troponins above his recent baseline with peak value 0.18.  EKG with sinus rhythm, first-degree AV block, nonspecific intraventricular conduction delay, no acute ST-T wave changes.  He is responding nicely to parenteral diuretic therapy.  Repeat echocardiogram showing further reduction in systolic function down to 20-25% and grade 2 diastolic dysfunction.  These changes are progressive compared with the March study. There is a clinical suspicion for additional underlying obstructive airway disease.  We will initiate a trial of bronchodilators. Patient subjectively improved.  Minimal rales left base on exam.  Weight 247 pounds.  No significant change compared with admission weight.  However, he is diuresing well.  -2.5 L.  Renal function improving with diuresis creatinine down from 1.6 to 1.45. We will continue current management.  Taper him off oxygen.  Transition to oral diuretics.

## 2018-05-01 NOTE — Progress Notes (Signed)
   Subjective: pt reports not feeling short of breath today, continued improvement.  No chest pain or discomfort.  Feels that his gout is flaring up.  Objective:  Vital signs in last 24 hours: Vitals:   04/30/18 2318 05/01/18 0414 05/01/18 0418 05/01/18 0842  BP: 120/74  109/62 (!) 100/59  Pulse: 68  73 77  Resp: 20  18   Temp: 98.1 F (36.7 C)  98 F (36.7 C)   TempSrc: Oral  Oral   SpO2: 97%  96% 94%  Weight:  247 lb 5.7 oz (112.2 kg)    Height:       Physical Exam  Constitutional: He appears well-developed and well-nourished.  HENT:  Head: Normocephalic and atraumatic.  Eyes: Right eye exhibits no discharge. Left eye exhibits no discharge. No scleral icterus.  Cardiovascular: Normal rate, regular rhythm and intact distal pulses. Exam reveals distant heart sounds. Exam reveals no gallop and no friction rub.  No murmur heard. Pulmonary/Chest: Effort normal. No respiratory distress. He has no wheezes. He no longer has rales (RLL have now resolved). Rales in LLL heard  coarse breath sounds and expiratory wheeze heard in LLF, LMF an RMF  Abdominal: Soft. Bowel sounds are normal. He exhibits no distension and no mass. There is no tenderness. There is no guarding.  Musculoskeletal: He exhibits edema (LE edema resolved).  Neurological: He is alert.  Skin: Skin is warm and dry.  Psychiatric: He has a normal mood and affect   Assessment/Plan:  Active Problems:   Shortness of breath   Acute HFrEF (heart failure with reduced ejection fraction) (HCC)  Acute on chronic HFrEF: pt denies any changes, however per chart review  wife corroborated last admission that there is indeed dietary indiscretion and non compliance.  Weight does appear to be stable on review.  Chest x-ray and exam concerning for pulm edema however breath sounds suggest possibility of lung disease and recent productive cough.  Procalcitonin returned with possibility of infection exacerbating CHF   -lasix 80mg   BID -wean O2 as tolerated as pt did not require O2 before admission -continue hydral 12.5mg  TID and isosorbide dinitrate 40mg  TID -ECHO shows worsened EF 20-25%, G2DD was taken off digoxin in April -will get repeat chest x-ray in am -PT  ?COPD: pts exam and studies are concerning for underlying obstructive lung disease, pt will deny but has Duoneb on home med list.  I suspect that there may be exacerbation contributing to his dyspnea  -duoneb will schedule for today see response -will cover pt with doxycyline in lieu of azithro to avoid QT prolongation   PAF: pt on amiodarone 200mg  BID, carvedilol 6.25mg  BID, digoxin was d/ced   -continue home meds   T2DM: pt only on glipizide 5mg  daily   -SSI moderate  CKD 3: pts creatinine mildly elevated from baseline   -creatinine improving with diuresis   BPH: on finasteride and tamsulosin at home   -continue finasteride and tamsulosin  Gout: on allopurinol 200mg  daily, seems to be flaring with diuresis  -continue voltaren gel -add on oxy 5mg  for breakthrough on top of regular home pain regimen for back pain   Dispo: Anticipated discharge in approximately 1-2 day(s).   Angelita Ingles, MD 05/01/2018, 11:15 AM Thornell Mule MD PGY-1 Internal Medicine Pager # [phone]

## 2018-05-01 NOTE — Progress Notes (Signed)
Pt refused to stand to get his weight c/o 8/10 right knee pain. Bed weight done.

## 2018-05-02 ENCOUNTER — Inpatient Hospital Stay (HOSPITAL_COMMUNITY): Payer: Non-veteran care

## 2018-05-02 DIAGNOSIS — M10072 Idiopathic gout, left ankle and foot: Secondary | ICD-10-CM

## 2018-05-02 DIAGNOSIS — Z7901 Long term (current) use of anticoagulants: Secondary | ICD-10-CM

## 2018-05-02 DIAGNOSIS — M10061 Idiopathic gout, right knee: Secondary | ICD-10-CM

## 2018-05-02 LAB — BASIC METABOLIC PANEL
Anion gap: 12 (ref 5–15)
BUN: 27 mg/dL — AB (ref 6–20)
CALCIUM: 8.9 mg/dL (ref 8.9–10.3)
CO2: 30 mmol/L (ref 22–32)
CREATININE: 1.56 mg/dL — AB (ref 0.61–1.24)
Chloride: 97 mmol/L — ABNORMAL LOW (ref 101–111)
GFR calc Af Amer: 46 mL/min — ABNORMAL LOW (ref >60)
GFR, EST NON AFRICAN AMERICAN: 40 mL/min — AB (ref >60)
GLUCOSE: 141 mg/dL — AB (ref 65–99)
POTASSIUM: 4 mmol/L (ref 3.5–5.1)
SODIUM: 139 mmol/L (ref 135–145)

## 2018-05-02 LAB — GLUCOSE, CAPILLARY
Glucose-Capillary: 132 mg/dL — ABNORMAL HIGH (ref 65–99)
Glucose-Capillary: 128 mg/dL — ABNORMAL HIGH (ref 65–99)
Glucose-Capillary: 181 mg/dL — ABNORMAL HIGH (ref 65–99)
Glucose-Capillary: 215 mg/dL — ABNORMAL HIGH (ref 65–99)

## 2018-05-02 LAB — SYNOVIAL CELL COUNT + DIFF, W/ CRYSTALS
Eosinophils-Synovial: 0 % (ref 0–1)
Lymphocytes-Synovial Fld: 0 % (ref 0–20)
Monocyte-Macrophage-Synovial Fluid: 5 % — ABNORMAL LOW (ref 50–90)
Neutrophil, Synovial: 95 % — ABNORMAL HIGH (ref 0–25)
WBC, Synovial: 10725 /mm3 — ABNORMAL HIGH (ref 0–200)

## 2018-05-02 MED ORDER — LIDOCAINE HCL 1 % IJ SOLN
5.0000 mL | Freq: Once | INTRAMUSCULAR | Status: DC
  Administered 2018-05-02: 5 mL
  Filled 2018-05-02 (×2): qty 5

## 2018-05-02 MED ORDER — LIDOCAINE HCL (PF) 1 % IJ SOLN
5.0000 mL | Freq: Once | INTRAMUSCULAR | Status: DC
  Filled 2018-05-02 (×2): qty 5

## 2018-05-02 MED ORDER — IPRATROPIUM-ALBUTEROL 0.5-2.5 (3) MG/3ML IN SOLN
3.0000 mL | RESPIRATORY_TRACT | Status: DC | PRN
  Administered 2018-05-03: 3 mL via RESPIRATORY_TRACT
  Filled 2018-05-02: qty 3

## 2018-05-02 MED ORDER — TRIAMCINOLONE ACETONIDE 40 MG/ML IJ SUSP
40.0000 mg | Freq: Once | INTRAMUSCULAR | Status: DC
  Filled 2018-05-02: qty 1

## 2018-05-02 MED ORDER — TRIAMCINOLONE ACETONIDE 40 MG/ML IJ SUSP
40.0000 mg | Freq: Once | INTRAMUSCULAR | Status: AC
  Administered 2018-05-02: 40 mg via INTRA_ARTICULAR
  Filled 2018-05-02: qty 1

## 2018-05-02 MED ORDER — MENTHOL 3 MG MT LOZG
1.0000 | LOZENGE | OROMUCOSAL | Status: DC | PRN
  Administered 2018-05-02: 3 mg via ORAL
  Filled 2018-05-02 (×2): qty 9

## 2018-05-02 MED ORDER — APIXABAN 2.5 MG PO TABS
2.5000 mg | ORAL_TABLET | Freq: Two times a day (BID) | ORAL | Status: DC
  Administered 2018-05-02 – 2018-05-04 (×4): 2.5 mg via ORAL
  Filled 2018-05-02 (×4): qty 1

## 2018-05-02 MED ORDER — TORSEMIDE 20 MG PO TABS
40.0000 mg | ORAL_TABLET | Freq: Every day | ORAL | Status: DC
  Administered 2018-05-02 – 2018-05-03 (×2): 40 mg via ORAL
  Filled 2018-05-02 (×2): qty 2

## 2018-05-02 NOTE — Progress Notes (Signed)
Subjective: pt reports not feeling short of breath today, continued improvement.  No chest pain or discomfort.  Gout is continuing to bother him, seems refractory to our current limited options for therapy, discussed option of articular injections with kenolog/lidocaine for relief and patient is agreeable.  Objective:  Vital signs in last 24 hours: Vitals:   05/02/18 0610 05/02/18 0737 05/02/18 0751 05/02/18 1042  BP:   117/74   Pulse:   81   Resp:   18   Temp:   98.5 F (36.9 C)   TempSrc:   Oral   SpO2: 95% 94% 95% 94%  Weight:      Height:       Physical Exam  Constitutional: He appears well-developed and well-nourished.  HENT:  Head: Normocephalic and atraumatic.  Eyes: Right eye exhibits no discharge. Left eye exhibits no discharge. No scleral icterus.  Cardiovascular: Normal rate, regular rhythm and intact distal pulses. Exam reveals distant heart sounds. Exam reveals no gallop and no friction rub.  No murmur heard. Pulmonary/Chest: Effort normal. No respiratory distress. He has no wheezes. He no longer has rales have resolved bilaterally Coarse breath sounds have improved from day prior, no wheezing today  Abdominal: Soft. Bowel sounds are normal. He exhibits no distension and no mass. There is no tenderness. There is no guarding.  Musculoskeletal: He exhibits edema (LE edema resolved).  Neurological: He is alert.  Skin: Skin is warm and dry.  Psychiatric: He has a normal mood and affect  BMP Latest Ref Rng & Units 05/02/2018 05/01/2018 04/30/2018  Glucose 65 - 99 mg/dL 443(X) 540(G) 867(Y)  BUN 6 - 20 mg/dL 19(J) 09(T) 26(Z)  Creatinine 0.61 - 1.24 mg/dL 1.24(P) 8.09(X) 8.33(A)  Sodium 135 - 145 mmol/L 139 139 143  Potassium 3.5 - 5.1 mmol/L 4.0 4.0 3.7  Chloride 101 - 111 mmol/L 97(L) 102 103  CO2 22 - 32 mmol/L 30 30 31   Calcium 8.9 - 10.3 mg/dL 8.9 2.5(K) 5.3(Z)    Assessment/Plan:  Active Problems:   Acute on chronic combined systolic and diastolic heart  failure (HCC)   Shortness of breath   Acute HFrEF (heart failure with reduced ejection fraction) (HCC)   First degree AV block  Acute on chronic HFrEF: pt denies any changes, however per chart review  wife corroborated last admission that there is indeed dietary indiscretion and non compliance.  Weight does appear to be stable on review.  Chest x-ray and exam concerning for pulm edema however breath sounds suggest possibility of lung disease and recent productive cough.  Procalcitonin returned with possibility of infection exacerbating CHF   -chest x-ray reviewed and shows resolution of pulm edema consistent with physical exam -will give 40mg  torsemide this am and reassess this afternoon may add 20mg  vs give a break.  Goal is to keep pt even. -wean O2 as tolerated pt was off O2 this morning at 95%  -continue hydral 12.5mg  TID and isosorbide dinitrate 40mg  TID -ECHO shows worsened EF 20-25%, G2DD was taken off digoxin in April -PT  ?COPD: pts exam and studies are concerning for underlying obstructive lung disease, pt will deny but has Duoneb on home med list.  I suspect that there may be exacerbation contributing to his dyspnea  -duoneb therapy given and wheezes resolved today -will continue doxycyline in lieu of azithro to avoid QT prolongation   PAF: pt on eliquis 5mg  BID, amiodarone 200mg  BID, carvedilol 6.25mg  BID, digoxin was d/ced   -Pt with increase in creatinine  now meets criteria for lower dose of eliquis will discuss risk vs benefit and alter if necessary.     T2DM: pt only on glipizide 5mg  daily   -SSI moderate  CKD 3: pts creatinine mildly elevated from baseline   -creatinine bumped up some today see above will shoot for even or very slightly net positive for the day   BPH: on finasteride and tamsulosin at home   -continue finasteride and tamsulosin  Gout: on allopurinol 200mg  daily, seems to be flaring with diuresis  -will give pt steroid injections for relief as our  oral therapeutic options are limited given cardiac and renal disease. -continue voltaren gel -additional oxy 5mg  available for breakthrough on top of regular home pain regimen for back pain   Dispo: Anticipated discharge in approximately later today vs tomorrow.   Angelita Ingles, MD 05/02/2018, 10:54 AM Thornell Mule MD PGY-1 Internal Medicine Pager # [phone]

## 2018-05-02 NOTE — Procedures (Addendum)
PROCEDURE NOTE  PROCEDURE: right knee joint steroid injection.  PREOPERATIVE DIAGNOSIS: gout of the right knee  POSTOPERATIVE DIAGNOSIS: gout of the right knee  PROCEDURE: The patient was apprised of the risks and the benefits of the procedure and informed consent was obtained. Time-out procedure was performed, with confirmation of the patient's name, date of birth, and correct identification of the Right knee to be injected. The patient's knee was then marked at the appropriate site for injection placement. The knee was sterilely prepped with Betadine. A 23 gauge needle was inserted into the joint space by ultrasound guidance and a total of about 12cc of straw colored cloudy fluid was drawn off with 5cc being sent to the lab for analysis.  Immediately after a solution of Kenalog was drawn up into a 5 mL syringe with a 2 mL of 1% lidocaine. The patient was injected with a 23-gauge needle at the medial aspect of his right flexed knee. There were no complications.   The procedure was supervised by attending physician, Dr. Harmon Dun.

## 2018-05-02 NOTE — Progress Notes (Signed)
Medicine attending: I examined this patient today together with resident physician Dr. Chana Bode.  I concur with his evaluation and management plan which we discussed together. With respect to his heart failure he has made continued progress.  Chest x-ray with resolved pulmonary edema.  We are resuming oral diuretics. He has active gout currently affecting his right knee and left ankle.  He is agreeable to steroid injections to relieve pain.  He has had minimal relief with topical nonsteroidals.  Not a candidate for oral nonsteroidals in view of his renal dysfunction. We will attempt to mobilize the patient.  He has been nonambulatory.  Anticipate this will be a slow recovery in view of pain in both lower extremities from gouty arthritis.  We will get physical therapy involved. His main health care resources the [facility]  We need to have our care managers research possibilities for short-term rehabilitation.

## 2018-05-02 NOTE — Evaluation (Signed)
Physical Therapy Evaluation Patient Details Name: Ryan Macdonald MRN: [id number] DOB: 01/06/1934 Today's Date: 05/02/2018   History of Present Illness     84 year old male presenting with SOB and hypoxemia with PMHx: gout, PAF, HFrEF, type 2 diabetes, HTN, HLD, Renal Insufficiency. clinical suspicion for additional underlying obstructive airway disease     Clinical Impression  Pt c/o 9/10 pain in R knee and L ankle secondary to gout. Pt reluctantly participated with PT to attempt to sit EOB. Pt demonstrated difficulty an increased pain with bed mobility as documented below and was unable to sit EOB. Pt has strong BUE and was able to pull himself towards HOB in supine with railing. Pt states that without gout flare up he would be able to ambulate and transfer easily. Pt would benefit from acute PT to improve functional mobility. Increased time required due to pain for all mobility.    Follow Up Recommendations SNF    Equipment Recommendations  None recommended by PT    Recommendations for Other Services       Precautions / Restrictions Precautions Precautions: Fall Precaution Comments: gout with pain and limited mobility Restrictions Weight Bearing Restrictions: No      Mobility  Bed Mobility Overal bed mobility: Needs Assistance Bed Mobility: Supine to Sit;Sit to Supine     Supine to sit: Mod assist;+2 for physical assistance Sit to supine: Mod assist;+2 for physical assistance   General bed mobility comments: Pt c/o 9/10 pain due to gout and requires mod A to manage BLE. Once BLE were off bed, pt asked PT to support RLE to avoid bending the knee. Pt attempted to bring trunk upright using bilateral bed rails but unable secondary to pain. Significantly increased time required for pt to perform all bed mobility  Transfers                 General transfer comment: unable due to pain  Ambulation/Gait                Stairs            Wheelchair Mobility     Modified Rankin (Stroke Patients Only)       Balance Overall balance assessment: Mild deficits observed, not formally tested     Sitting balance - Comments: Difficult to assess due to pt not sitting fully EOB but pt heavily reliant on UE on bedrails                                     Pertinent Vitals/Pain Pain Assessment: 0-10 Pain Score: 9  Pain Location: R knee and L ankle Pain Intervention(s): Monitored during session;Limited activity within patient's tolerance;Patient requesting pain meds-RN notified;Heat applied    Home Living Family/patient expects to be discharged to:: Private residence Living Arrangements: Spouse/significant other Available Help at Discharge: Family;Available 24 hours/day Type of Home: Apartment Home Access: Stairs to enter Entrance Stairs-Rails: Can reach both Entrance Stairs-Number of Steps: 7 Home Layout: One level Home Equipment: Bedside commode;Shower seat;Cane - single point;Other (comment);Grab bars - toilet(Rollator)      Prior Function Level of Independence: Independent with assistive device(s)         Comments: Uses SPC and RW as needed; Pt drives, cooks, Conservation officer, historic buildings Dominance        Extremity/Trunk Assessment   Upper Extremity Assessment Upper Extremity Assessment: Overall WFL for tasks assessed    Lower  Extremity Assessment Lower Extremity Assessment: LLE deficits/detail;RLE deficits/detail RLE Deficits / Details: limited by pain with assist to move legs onto and off of bed with very limited ROM RLE: Unable to fully assess due to pain LLE Deficits / Details: limited by pain with assist to move legs onto and off of bed with very limited ROM LLE: Unable to fully assess due to pain       Communication   Communication: No difficulties  Cognition Arousal/Alertness: Awake/alert Behavior During Therapy: WFL for tasks assessed/performed Overall Cognitive Status: Within Functional Limits for tasks  assessed                                 General Comments: Pt tangential in conversation      General Comments      Exercises     Assessment/Plan    PT Assessment Patient needs continued PT services  PT Problem List Decreased strength;Decreased activity tolerance;Decreased mobility;Decreased knowledge of use of DME;Cardiopulmonary status limiting activity;Obesity;Pain       PT Treatment Interventions DME instruction;Gait training;Stair training;Functional mobility training;Therapeutic activities;Therapeutic exercise;Balance training;Neuromuscular re-education;Patient/family education    PT Goals (Current goals can be found in the Care Plan section)  Acute Rehab PT Goals Patient Stated Goal: To decrease pain  PT Goal Formulation: With patient Time For Goal Achievement: 05/16/18 Potential to Achieve Goals: Fair    Frequency Min 2X/week   Barriers to discharge Other (comment) Current mobility level secondary to pain. Pt reports that without gout flare up, he has no difficulty ambulating or transferring    Co-evaluation               AM-PAC PT "6 Clicks" Daily Activity  Outcome Measure Difficulty turning over in bed (including adjusting bedclothes, sheets and blankets)?: A Lot Difficulty moving from lying on back to sitting on the side of the bed? : Unable Difficulty sitting down on and standing up from a chair with arms (e.g., wheelchair, bedside commode, etc,.)?: Unable Help needed moving to and from a bed to chair (including a wheelchair)?: Total Help needed walking in hospital room?: Total Help needed climbing 3-5 steps with a railing? : Total 6 Click Score: 7    End of Session Equipment Utilized During Treatment: Oxygen Activity Tolerance: Patient limited by pain Patient left: in bed;with call bell/phone within reach Nurse Communication: Mobility status;Patient requests pain meds PT Visit Diagnosis: Pain;Other abnormalities of gait and mobility  (R26.89) Pain - Right/Left: Right Pain - part of body: Knee    Time: 1004-1036 PT Time Calculation (min) (ACUTE ONLY): 32 min   Charges:   PT Evaluation $PT Eval Moderate Complexity: 1 Mod PT Treatments $Therapeutic Activity: 8-22 mins   PT G Codes:        Gabe Luticia Tadros, SPT  [employer] 05/02/2018, 12:23 PM

## 2018-05-02 NOTE — Care Management Note (Addendum)
Case Management Note  Patient Details  Name: Castor Press MRN: [id number] Date of Birth: 06-18-1934  Subjective/Objective:    SOB               Action/Plan: Patient is known to me from previous admission; lives at home with his spouse; goes to the [place] for medical Care.  Patient goes to the [facility] for primary care and for his medication. [phone]- follow the prompts #1 [facility], Dr Tereasa Coop; fax # [phone]. Patient stated that if his prescriptions are sent today, they will send him the medication the next day; Attending MD, please put the patient last 4 numbers of his social security number on the prescription for the [facility] to identify the patient; ( [id number]). [name] [phone]  Expected Discharge Date:   possibly 05/06/2018               Expected Discharge Plan:  Home/Self Care  In-House Referral:    Spiritual Consult, recent death of grandchild  Discharge planning Services  CM Consult  Status of Service:  In process, will continue to follow  [name] [phone] 05/02/2018, 3:08 PM

## 2018-05-03 LAB — BASIC METABOLIC PANEL
ANION GAP: 11 (ref 5–15)
BUN: 31 mg/dL — ABNORMAL HIGH (ref 6–20)
CALCIUM: 8.8 mg/dL — AB (ref 8.9–10.3)
CO2: 27 mmol/L (ref 22–32)
Chloride: 98 mmol/L — ABNORMAL LOW (ref 101–111)
Creatinine, Ser: 1.61 mg/dL — ABNORMAL HIGH (ref 0.61–1.24)
GFR, EST AFRICAN AMERICAN: 45 mL/min — AB (ref >60)
GFR, EST NON AFRICAN AMERICAN: 38 mL/min — AB (ref >60)
GLUCOSE: 162 mg/dL — AB (ref 65–99)
Potassium: 4.2 mmol/L (ref 3.5–5.1)
SODIUM: 136 mmol/L (ref 135–145)

## 2018-05-03 LAB — GLUCOSE, CAPILLARY
GLUCOSE-CAPILLARY: 184 mg/dL — AB (ref 65–99)
GLUCOSE-CAPILLARY: 189 mg/dL — AB (ref 65–99)
Glucose-Capillary: 133 mg/dL — ABNORMAL HIGH (ref 65–99)
Glucose-Capillary: 227 mg/dL — ABNORMAL HIGH (ref 65–99)

## 2018-05-03 MED ORDER — FLUTICASONE FUROATE-VILANTEROL 100-25 MCG/INH IN AEPB
1.0000 | INHALATION_SPRAY | Freq: Every day | RESPIRATORY_TRACT | Status: DC
  Administered 2018-05-04: 1 via RESPIRATORY_TRACT
  Filled 2018-05-03: qty 28

## 2018-05-03 MED ORDER — TIOTROPIUM BROMIDE MONOHYDRATE 18 MCG IN CAPS: 18.0000 ug | ORAL_CAPSULE | Freq: Every day | RESPIRATORY_TRACT | Status: DC

## 2018-05-03 NOTE — Progress Notes (Signed)
Requesting something for dry cough. Paged MD on call, per MD on call try breathing treatment first. Will continue to monitor.  Kashtyn Jankowski, RN

## 2018-05-03 NOTE — Progress Notes (Addendum)
Subjective: pt reports not feeling short of breath today, gout in knee is much improved.  Anxious to try and work with physical therapy.    Objective:  Vital signs in last 24 hours: Vitals:   05/02/18 2140 05/03/18 0516 05/03/18 0537 05/03/18 1224  BP: 97/68 115/66  121/66  Pulse:  76  74  Resp:  16  18  Temp:  99.3 F (37.4 C)    TempSrc:  Oral    SpO2:  94%  97%  Weight:   240 lb 8.4 oz (109.1 kg)   Height:       Physical Exam  Constitutional: He appears well-developed and well-nourished.  HENT:  Head: Normocephalic and atraumatic.  Eyes: Right eye exhibits no discharge. Left eye exhibits no discharge. No scleral icterus.  Cardiovascular: Normal rate, regular rhythm and intact distal pulses. Exam reveals distant heart sounds. Exam reveals no gallop and no friction rub.  No murmur heard. Pulmonary/Chest: Effort normal. No respiratory distress. He has no wheezes. He no longer has rales have resolved bilaterally Mild wheezing appreciated  Abdominal: Soft. Bowel sounds are normal. He exhibits no distension and no mass. There is no tenderness. There is no guarding.  Musculoskeletal: He exhibits edema (LE edema resolved).  Neurological: He is alert.  Skin: Skin is warm and dry.  Psychiatric: He has a normal mood and affect  BMP Latest Ref Rng & Units 05/03/2018 05/02/2018 05/01/2018  Glucose 65 - 99 mg/dL 956(O) 130(Q) 657(Q)  BUN 6 - 20 mg/dL 46(N) 62(X) 52(W)  Creatinine 0.61 - 1.24 mg/dL 4.13(K) 4.40(N) 0.27(O)  Sodium 135 - 145 mmol/L 136 139 139  Potassium 3.5 - 5.1 mmol/L 4.2 4.0 4.0  Chloride 101 - 111 mmol/L 98(L) 97(L) 102  CO2 22 - 32 mmol/L 27 30 30   Calcium 8.9 - 10.3 mg/dL 5.3(G) 8.9 6.4(Q)    Assessment/Plan:  Active Problems:   Chronic renal insufficiency, stage 3 (moderate) (HCC)   Acute on chronic combined systolic and diastolic heart failure (HCC)   Shortness of breath   Acute HFrEF (heart failure with reduced ejection fraction) (HCC)   First degree AV  block  Acute on chronic HFrEF: pt denies any changes, however per chart review  wife corroborated last admission that there is indeed dietary indiscretion and non compliance.  Weight does appear to be stable on review.  Chest x-ray and exam concerning for pulm edema however breath sounds suggest possibility of lung disease and recent productive cough.  Procalcitonin returned with possibility of infection exacerbating CHF. ECHO shows worsened EF 20-25%, G2DD was taken off digoxin in April    -pt given another 40mg  of torsemide today this is still half his home dose will hold for tomorrow seemed to keep him slightly negative yesterday likely needs just a small amount of fluid back asked pt to drink about 500cc more than day prior -wean O2 as tolerated  -continue hydral 12.5mg  TID and isosorbide dinitrate 40mg  TID -PT  ?COPD: pts exam and studies are concerning for underlying obstructive lung disease, pt will deny but has Duoneb on home med list.  I suspect that there may be exacerbation contributing to his dyspnea  -will start breo today as pt benefited from duoneb therapy -will continue doxycyline in lieu of azithro to avoid QT prolongation 5 day course   PAF: pt on eliquis 5mg  BID, amiodarone 200mg  BID, carvedilol 6.25mg  BID, digoxin was d/ced   -eliquis decreased to 2.5mg  BID    T2DM: pt only on glipizide  5mg  daily   -SSI moderate  CKD 3: pts creatinine mildly elevated from baseline   -creatinine bumped up some more today will  Adjusting diureses   BPH: on finasteride and tamsulosin at home   -continue finasteride and tamsulosin  Gout: on allopurinol 200mg  daily, seems to be flaring with diuresis  -steroid injection of right knee very helpful for pt, labs from arthrocentesis positive for gout no signs of infection,  -continue voltaren gel for left ankle -additional oxy 5mg  available for breakthrough on top of regular home pain regimen for back pain PRN   Dispo: Anticipated  discharge home with home health as pt is refusing SNF will see how he does with PT today and check renal function in am, possible discharge tomorrow.   Angelita Ingles, MD 05/03/2018, 2:28 PM Thornell Mule MD PGY-1 Internal Medicine Pager # [phone]

## 2018-05-03 NOTE — Clinical Social Work Note (Signed)
Clinical Social Work Assessment  Patient Details  Name: Ryan Macdonald MRN: [id number] Date of Birth: [birth year]  Date of referral:  05/03/18               Reason for consult:  Facility Placement, Discharge Planning                Permission sought to share information with:    Permission granted to share information::  No  Name::        Agency::     Relationship::     Contact Information:     Housing/Transportation Living arrangements for the past 2 months:  Apartment Source of Information:  Patient, Medical Team Patient Interpreter Needed:  None Criminal Activity/Legal Involvement Pertinent to Current Situation/Hospitalization:  No - Comment as needed Significant Relationships:  Spouse Lives with:  Spouse Do you feel safe going back to the place where you live?  Yes Need for family participation in patient care:  Yes (Comment)  Care giving concerns:  PT recommending SNF once medically stable for discharge.   Social Worker assessment / plan:  CSW met with patient. No supports at bedside. CSW introduced role and explained that PT recommendations would be discussed. Patient does not want SNF placement but is agreeable to home health. He is not currently set up with a home health agency. Will notify RNCM. No further concerns. CSW signing off as social work intervention is no longer needed.  Employment status:  Retired Forensic scientist:  [facility] [employer] PT Recommendations:  [place] / Referral to community resources:  [place]  Patient/Family's Response to care:  Patient does not want SNF placement. Patient's wife supportive and involved in patient's care. Patient appreciated social work intervention.  Patient/Family's Understanding of and Emotional Response to Diagnosis, Current Treatment, and Prognosis:  Patient has a good understanding of the reason for admission and his need for therapy after discharge. Patient appears pleased with  hospital care.  Emotional Assessment Appearance:  Appears stated age Attitude/Demeanor/Rapport:  Engaged Affect (typically observed):  Appropriate, Calm, Pleasant Orientation:  Oriented to Self, Oriented to Place, Oriented to  Time, Oriented to Situation Alcohol / Substance use:  Never Used Psych involvement (Current and /or in the community):  No (Comment)  Discharge Needs  Concerns to be addressed:  Care Coordination Readmission within the last 30 days:  No Current discharge risk:  Dependent with Mobility Barriers to Discharge:  Continued Medical Work up   Candie Chroman, LCSW 05/03/2018, 11:44 AM

## 2018-05-03 NOTE — Progress Notes (Signed)
Medicine attending: I personally examined this patient today together with resident physician Dr. Chana Bode and I concur with his evaluation and management plan which we discussed together. He remains clinically stable from cardiac standpoint.  Lungs are clear except for scattered expiratory wheezes.  No rales.  Regular cardiac rhythm.  Resolved peripheral edema. Creatinine now trending up.  He has probably reached maximum benefit from parenteral diuretics.  He was just transition to oral torsemide. Significant improvement following a steroid injection to the right knee joint.  Fluid analysis confirms crystals consistent with uric acid and compatible with his history of gout.  He is still using topical nonsteroidals on his left ankle. Discharge planning in progress.  He initially agreed to short-term skilled nursing facility but he changed his mind and would prefer home health services.  He is a Cytogeneticist.

## 2018-05-03 NOTE — Progress Notes (Signed)
   05/03/18 1059  Clinical Encounter Type  Visited With Patient;Health care provider  Visit Type Initial  Referral From Patient;Nurse  Consult/Referral To Chaplain  Spiritual Encounters  Spiritual Needs Grief support;Prayer  Stress Factors  Patient Stress Factors Loss (recent loss of grandson)   Responded to a [facility] for prayer and support.  Patient stated his grandson (48) died 2 days ago and had a wife and 2 daughters.  Patient shared much about his life story and was seeking ways to help his granddaughter in law.  Stated he really wants to be at the funeral in [place] on the 10th of this month.  We prayed together.  Will follow and support as needed. Chaplain Agustin Cree

## 2018-05-04 DIAGNOSIS — E872 Acidosis: Secondary | ICD-10-CM

## 2018-05-04 DIAGNOSIS — Z8619 Personal history of other infectious and parasitic diseases: Secondary | ICD-10-CM

## 2018-05-04 DIAGNOSIS — R0689 Other abnormalities of breathing: Secondary | ICD-10-CM

## 2018-05-04 DIAGNOSIS — L905 Scar conditions and fibrosis of skin: Secondary | ICD-10-CM

## 2018-05-04 LAB — BASIC METABOLIC PANEL
Anion gap: 8 (ref 5–15)
BUN: 49 mg/dL — ABNORMAL HIGH (ref 6–20)
CHLORIDE: 100 mmol/L — AB (ref 101–111)
CO2: 28 mmol/L (ref 22–32)
Calcium: 9 mg/dL (ref 8.9–10.3)
Creatinine, Ser: 1.62 mg/dL — ABNORMAL HIGH (ref 0.61–1.24)
GFR calc non Af Amer: 38 mL/min — ABNORMAL LOW (ref >60)
GFR, EST AFRICAN AMERICAN: 44 mL/min — AB (ref >60)
Glucose, Bld: 154 mg/dL — ABNORMAL HIGH (ref 65–99)
POTASSIUM: 4.6 mmol/L (ref 3.5–5.1)
SODIUM: 136 mmol/L (ref 135–145)

## 2018-05-04 LAB — GLUCOSE, CAPILLARY
GLUCOSE-CAPILLARY: 217 mg/dL — AB (ref 65–99)
GLUCOSE-CAPILLARY: 134 mg/dL — AB (ref 65–99)
Glucose-Capillary: 144 mg/dL — ABNORMAL HIGH (ref 65–99)

## 2018-05-04 MED ORDER — APIXABAN 2.5 MG PO TABS: 2.5000 mg | ORAL_TABLET | Freq: Two times a day (BID) | ORAL | 0 refills | Status: DC

## 2018-05-04 MED ORDER — FLUTICASONE FUROATE-VILANTEROL 100-25 MCG/INH IN AEPB: 1.0000 | INHALATION_SPRAY | Freq: Every day | RESPIRATORY_TRACT | 3 refills | Status: DC

## 2018-05-04 MED ORDER — ROSUVASTATIN CALCIUM 20 MG PO TABS: 20.0000 mg | ORAL_TABLET | Freq: Every day | ORAL | 2 refills | Status: DC

## 2018-05-04 MED ORDER — TORSEMIDE 20 MG PO TABS
ORAL_TABLET | ORAL | 0 refills | Status: DC
Start: 2018-05-04 — End: 2018-07-20

## 2018-05-04 NOTE — Progress Notes (Addendum)
Physical Therapy Treatment Patient Details Name: Ryan Macdonald MRN: [id number] DOB: Apr 11, 1934 Today's Date: 05/04/2018    History of Present Illness 84 year old male presenting with SOB and hypoxemia with PMHx: gout, PAF, HFrEF, type 2 diabetes, HTN, HLD, Renal Insufficiency. clinical suspicion for additional underlying obstructive airway disease    PT Comments    PT with markedly improved function with decreased gout pain. Pt able to stand, walk and perform stairs today with increased time. Pt reports continued ankle pain but able to perform mobility. Pt encouraged to get up and move with nursing to improve strength and function. Pt required assist to don briefs in bed prior to gait and maintains wide BOS with flexed trunk which pt reports is baseline and even with cues unable to achieve trunk extension in standing. Will continue to follow with pt declining SNF, demonstrating improved function and wife home to assist.   HR 79 with activity   Follow Up Recommendations  Home health PT;Supervision/Assistance - 24 hour     Equipment Recommendations  None recommended by PT    Recommendations for Other Services       Precautions / Restrictions Precautions Precautions: Fall Restrictions Weight Bearing Restrictions: No    Mobility  Bed Mobility Overal bed mobility: Needs Assistance Bed Mobility: Supine to Sit     Supine to sit: Supervision;HOB elevated     General bed mobility comments: HOB 25degrees with use of rail and increased time to pivot to EOB  Transfers Overall transfer level: Needs assistance   Transfers: Sit to/from Stand Sit to Stand: Min guard         General transfer comment: pt pulling up on RW despite cues with increased time to achieve standing, cues to control descent  Ambulation/Gait Ambulation/Gait assistance: Min guard Ambulation Distance (Feet): 160 Feet Assistive device: Rolling walker (2 wheeled) Gait Pattern/deviations: Step-through  pattern;Decreased stride length;Trunk flexed;Wide base of support   Gait velocity interpretation: <1.8 ft/sec, indicate of risk for recurrent falls General Gait Details: pt remains flexed over RW with wide BOS and reliant on bil UE for support in standing attempted gait without RW 10' with increased postural sway and unsteadiness with education to maintain RW with gait  Stairs Stairs: Yes Stairs assistance: Min guard Stair Management: Step to pattern;Forwards;Two rails Number of Stairs: 6 General stair comments: up with RLE and down with LLE. Pt with slow cautious performance on stairs with guarding for safety and reliant on bil rails even though he states he can use only one rail at home   Wheelchair Mobility    Modified Rankin (Stroke Patients Only)       Balance Overall balance assessment: Needs assistance   Sitting balance-Leahy Scale: Good Sitting balance - Comments: able to sit EOB without UE support     Standing balance-Leahy Scale: Fair Standing balance comment: able to stand without use of bil UE but reaching out for RW and more stable with bil UE support                            Cognition Arousal/Alertness: Awake/alert Behavior During Therapy: WFL for tasks assessed/performed Overall Cognitive Status: No family/caregiver present to determine baseline cognitive functioning                                 General Comments: Pt tangential in conversation, joking and at time hard to discern whether confused  or joking      Exercises      General Comments        Pertinent Vitals/Pain Pain Assessment: 0-10 Pain Score: 4  Pain Location: left ankle Pain Descriptors / Indicators: Aching;Sore Pain Intervention(s): Limited activity within patient's tolerance;Repositioned;Monitored during session    Home Living                      Prior Function            PT Goals (current goals can now be found in the care plan section)  Progress towards PT goals: Progressing toward goals    Frequency    Min 3X/week      PT Plan Discharge plan needs to be updated    Co-evaluation              AM-PAC PT "6 Clicks" Daily Activity  Outcome Measure  Difficulty turning over in bed (including adjusting bedclothes, sheets and blankets)?: A Little Difficulty moving from lying on back to sitting on the side of the bed? : A Little Difficulty sitting down on and standing up from a chair with arms (e.g., wheelchair, bedside commode, etc,.)?: A Little Help needed moving to and from a bed to chair (including a wheelchair)?: A Little Help needed walking in hospital room?: A Little Help needed climbing 3-5 steps with a railing? : A Little 6 Click Score: 18    End of Session Equipment Utilized During Treatment: Gait belt Activity Tolerance: Patient tolerated treatment well Patient left: in chair;with call bell/phone within reach;with chair alarm set Nurse Communication: Mobility status PT Visit Diagnosis: Other abnormalities of gait and mobility (R26.89);Muscle weakness (generalized) (M62.81);Pain Pain - Right/Left: Left Pain - part of body: Ankle and joints of foot     Time: 1031-1110 PT Time Calculation (min) (ACUTE ONLY): 39 min  Charges:  $Gait Training: 23-37 mins $Therapeutic Activity: 8-22 mins                    G Codes:       Delaney Meigs, PT [phone]    Olufemi Mofield B Lenyx Boody 05/04/2018, 11:23 AM

## 2018-05-04 NOTE — Progress Notes (Signed)
Message left with [name] CHF Navigator concerning follow up by the Paramedicine program as requested. [name] [phone]

## 2018-05-04 NOTE — Progress Notes (Signed)
Medicine attending: I examined this patient today together with resident physician Dr. Chana Bode and I concur with his evaluation and management plan. Respiratory status is stable.  Lungs remain clear.  Regular cardiac rhythm.  No S3 gallop.  No JVD.  No peripheral edema.  Scar in the right neck from previous infection and drainage of a lymph node related to cat scratch fever.  Weight has plateaued at 246 pounds little change from admission and likely reflects bed scale variability. BUN and creatinine inching up and we will hold diuretics today. With control of his gout pain status post steroid injection to the right knee joint, he is now ambulatory with assistance and made significant progress with physical therapy. Anticipate discharge later today if home health arrangements are in place.

## 2018-05-04 NOTE — Progress Notes (Signed)
Subjective: pt reports not feeling short of breath today, he is off supplemental O2, gout in knee is much improved, he was able to stand today beside the bed.  Discussed getting PT to work with him today.    Objective:  Vital signs in last 24 hours: Vitals:   05/04/18 0517 05/04/18 0624 05/04/18 0805 05/04/18 0842  BP: 126/72  128/77   Pulse: 66  69   Resp:      Temp:      TempSrc:      SpO2: 94%  95% 100%  Weight:  246 lb 0.5 oz (111.6 kg)    Height:       Physical Exam  Constitutional: He appears well-developed and well-nourished.  HENT:  Head: Normocephalic and atraumatic.  Eyes: Right eye exhibits no discharge. Left eye exhibits no discharge. No scleral icterus.  Cardiovascular: Normal rate, regular rhythm and intact distal pulses. Exam reveals distant heart sounds. Exam reveals no gallop and no friction rub.  No murmur heard. Pulmonary/Chest: Effort normal. No respiratory distress. He has no wheezes. He no longer has rales have resolved bilaterally Mild wheezing appreciated  Abdominal: Soft. Bowel sounds are normal. He exhibits no distension and no mass. There is no tenderness. There is no guarding.  Musculoskeletal: He exhibits edema (LE edema resolved).  Neurological: He is alert.  Skin: Skin is warm and dry.  Psychiatric: He has a normal mood and affect  BMP Latest Ref Rng & Units 05/04/2018 05/03/2018 05/02/2018  Glucose 65 - 99 mg/dL 388(E) 280(K) 349(Z)  BUN 6 - 20 mg/dL 79(X) 50(V) 69(V)  Creatinine 0.61 - 1.24 mg/dL 9.48(A) 1.65(V) 3.74(M)  Sodium 135 - 145 mmol/L 136 136 139  Potassium 3.5 - 5.1 mmol/L 4.6 4.2 4.0  Chloride 101 - 111 mmol/L 100(L) 98(L) 97(L)  CO2 22 - 32 mmol/L 28 27 30   Calcium 8.9 - 10.3 mg/dL 9.0 2.7(M) 8.9    Assessment/Plan:  Active Problems:   Chronic renal insufficiency, stage 3 (moderate) (HCC)   Acute on chronic combined systolic and diastolic heart failure (HCC)   Shortness of breath   Acute HFrEF (heart failure with reduced  ejection fraction) (HCC)   First degree AV block  Acute on chronic HFrEF: pt denies any changes, however per chart review  wife corroborated last admission that there is indeed dietary indiscretion and non compliance.  Weight does appear to be stable on review.  Chest x-ray and exam concerning for pulm edema however breath sounds suggest possibility of lung disease and recent productive cough.  Procalcitonin returned with possibility of infection exacerbating CHF. ECHO shows worsened EF 20-25%, G2DD was taken off digoxin in April    -will give pt a diuretic holiday today.  Would opt to start him at a lower torsemide dosage on discharge with plans for him to keep a close eye on his weight and adjust diuresis further outpatient. -95% on room air today -continue hydral 12.5mg  TID and isosorbide dinitrate 40mg  TID -PT, ambualte with pulse ox  ?COPD: pts exam and studies are concerning for underlying obstructive lung disease, pt will deny but has Duone[facility] on home med list.  I suspect that there may be exacerbation contributing to his dyspnea  -benefiting from breo will continue -will continue doxycyline in lieu of azithro to avoid QT prolongation 5 day course   PAF: pt on eliquis 5mg  BID, amiodarone 200mg  BID, carvedilol 6.25mg  BID, digoxin was d/ced   -eliquis decreased to 2.5mg  BID    T2DM: pt  only on glipizide 5mg  daily   -SSI moderate  CKD 3: pts creatinine mildly elevated from baseline   -creatinine stable   BPH: on finasteride and tamsulosin at home   -continue finasteride and tamsulosin  Gout: on allopurinol 200mg  daily, seems to be flaring with diuresis steroid injection of right knee 6/4 very helpful for pt, labs from arthrocentesis positive for gout no signs of infection,   -continue voltaren gel for left ankle -additional oxy 5mg  available for breakthrough on top of regular home pain regimen for back pain PRN   Dispo: Anticipated discharge home with home health after working  with physical therapy today vs tomorrow  Angelita Ingles, MD 05/04/2018, 10:09 AM Thornell Mule MD PGY-1 Internal Medicine Pager # [phone]

## 2018-05-05 ENCOUNTER — Telehealth (HOSPITAL_COMMUNITY): Payer: Self-pay | Admitting: Surgery

## 2018-05-05 NOTE — Telephone Encounter (Signed)
Call placed to request referral to the [facility] and [facility] from Dr. Tereasa Coop at [place].  Patient has refused appts in the past due to the fact that he has been told it was not approved and [facility] would not [facility].  Nurse will return call on Monday to advise if referral is approved.

## 2018-05-06 LAB — BODY FLUID CULTURE
CULTURE: NO GROWTH
Special Requests: NORMAL

## 2018-05-16 ENCOUNTER — Telehealth (HOSPITAL_COMMUNITY): Payer: Self-pay | Admitting: Surgery

## 2018-05-16 NOTE — Telephone Encounter (Signed)
I attempted to call Mr. Hangartner back to let him know that after I spoke with the [facility] they indicated that he would need to call and request a referral to the [facility] himself.  I was unable to leave a message and will try to call back later.

## 2018-07-13 ENCOUNTER — Encounter: Payer: Self-pay | Admitting: Cardiology

## 2018-07-17 ENCOUNTER — Ambulatory Visit (INDEPENDENT_AMBULATORY_CARE_PROVIDER_SITE_OTHER): Payer: Non-veteran care | Admitting: Podiatry

## 2018-07-17 DIAGNOSIS — M79676 Pain in unspecified toe(s): Secondary | ICD-10-CM | POA: Diagnosis not present

## 2018-07-17 DIAGNOSIS — E0843 Diabetes mellitus due to underlying condition with diabetic autonomic (poly)neuropathy: Secondary | ICD-10-CM

## 2018-07-17 DIAGNOSIS — B351 Tinea unguium: Secondary | ICD-10-CM

## 2018-07-18 ENCOUNTER — Encounter (HOSPITAL_COMMUNITY): Payer: Self-pay | Admitting: Emergency Medicine

## 2018-07-18 ENCOUNTER — Other Ambulatory Visit: Payer: Self-pay

## 2018-07-18 ENCOUNTER — Emergency Department (HOSPITAL_COMMUNITY): Payer: No Typology Code available for payment source

## 2018-07-18 ENCOUNTER — Inpatient Hospital Stay (HOSPITAL_COMMUNITY)
Admission: EM | Admit: 2018-07-18 | Discharge: 2018-07-20 | DRG: 291 | Disposition: A | Discharge status: Home / Self Care | Payer: No Typology Code available for payment source | Attending: Cardiology | Admitting: Cardiology

## 2018-07-18 DIAGNOSIS — R7989 Other specified abnormal findings of blood chemistry: Secondary | ICD-10-CM

## 2018-07-18 DIAGNOSIS — Z888 Allergy status to other drugs, medicaments and biological substances status: Secondary | ICD-10-CM

## 2018-07-18 DIAGNOSIS — I480 Paroxysmal atrial fibrillation: Secondary | ICD-10-CM | POA: Diagnosis present

## 2018-07-18 DIAGNOSIS — E785 Hyperlipidemia, unspecified: Secondary | ICD-10-CM | POA: Diagnosis present

## 2018-07-18 DIAGNOSIS — R778 Other specified abnormalities of plasma proteins: Secondary | ICD-10-CM

## 2018-07-18 DIAGNOSIS — N183 Chronic kidney disease, stage 3 (moderate): Secondary | ICD-10-CM | POA: Diagnosis present

## 2018-07-18 DIAGNOSIS — Z96641 Presence of right artificial hip joint: Secondary | ICD-10-CM | POA: Diagnosis present

## 2018-07-18 DIAGNOSIS — I130 Hypertensive heart and chronic kidney disease with heart failure and stage 1 through stage 4 chronic kidney disease, or unspecified chronic kidney disease: Principal | ICD-10-CM | POA: Diagnosis present

## 2018-07-18 DIAGNOSIS — Z6839 Body mass index (BMI) 39.0-39.9, adult: Secondary | ICD-10-CM

## 2018-07-18 DIAGNOSIS — Z86711 Personal history of pulmonary embolism: Secondary | ICD-10-CM

## 2018-07-18 DIAGNOSIS — Z7901 Long term (current) use of anticoagulants: Secondary | ICD-10-CM | POA: Diagnosis not present

## 2018-07-18 DIAGNOSIS — Z7984 Long term (current) use of oral hypoglycemic drugs: Secondary | ICD-10-CM | POA: Diagnosis not present

## 2018-07-18 DIAGNOSIS — I5043 Acute on chronic combined systolic (congestive) and diastolic (congestive) heart failure: Secondary | ICD-10-CM | POA: Diagnosis present

## 2018-07-18 DIAGNOSIS — Z87891 Personal history of nicotine dependence: Secondary | ICD-10-CM | POA: Diagnosis not present

## 2018-07-18 DIAGNOSIS — Z8249 Family history of ischemic heart disease and other diseases of the circulatory system: Secondary | ICD-10-CM | POA: Diagnosis not present

## 2018-07-18 DIAGNOSIS — I509 Heart failure, unspecified: Secondary | ICD-10-CM

## 2018-07-18 DIAGNOSIS — Z87442 Personal history of urinary calculi: Secondary | ICD-10-CM | POA: Diagnosis not present

## 2018-07-18 DIAGNOSIS — R748 Abnormal levels of other serum enzymes: Secondary | ICD-10-CM | POA: Diagnosis present

## 2018-07-18 DIAGNOSIS — I429 Cardiomyopathy, unspecified: Secondary | ICD-10-CM | POA: Diagnosis present

## 2018-07-18 DIAGNOSIS — Z79899 Other long term (current) drug therapy: Secondary | ICD-10-CM

## 2018-07-18 DIAGNOSIS — Z95810 Presence of automatic (implantable) cardiac defibrillator: Secondary | ICD-10-CM | POA: Diagnosis not present

## 2018-07-18 HISTORY — DX: Dyspnea, unspecified: R06.00

## 2018-07-18 LAB — BASIC METABOLIC PANEL
ANION GAP: 8 (ref 5–15)
ANION GAP: 11 (ref 5–15)
BUN: 21 mg/dL (ref 8–23)
BUN: 20 mg/dL (ref 8–23)
CHLORIDE: 110 mmol/L (ref 98–111)
CHLORIDE: 106 mmol/L (ref 98–111)
CO2: 21 mmol/L — ABNORMAL LOW (ref 22–32)
CO2: 24 mmol/L (ref 22–32)
Calcium: 8.5 mg/dL — ABNORMAL LOW (ref 8.9–10.3)
Calcium: 9.1 mg/dL (ref 8.9–10.3)
Creatinine, Ser: 1.64 mg/dL — ABNORMAL HIGH (ref 0.61–1.24)
Creatinine, Ser: 1.46 mg/dL — ABNORMAL HIGH (ref 0.61–1.24)
GFR calc Af Amer: 43 mL/min — ABNORMAL LOW (ref >60)
GFR calc Af Amer: 50 mL/min — ABNORMAL LOW (ref >60)
GFR, EST NON AFRICAN AMERICAN: 37 mL/min — AB (ref >60)
GFR, EST NON AFRICAN AMERICAN: 43 mL/min — AB (ref >60)
Glucose, Bld: 172 mg/dL — ABNORMAL HIGH (ref 70–99)
Glucose, Bld: 114 mg/dL — ABNORMAL HIGH (ref 70–99)
POTASSIUM: 4 mmol/L (ref 3.5–5.1)
POTASSIUM: 4.6 mmol/L (ref 3.5–5.1)
SODIUM: 139 mmol/L (ref 135–145)
SODIUM: 141 mmol/L (ref 135–145)

## 2018-07-18 LAB — BRAIN NATRIURETIC PEPTIDE: B NATRIURETIC PEPTIDE 5: 295.5 pg/mL — AB (ref 0.0–100.0)

## 2018-07-18 LAB — CBC
HEMATOCRIT: 36.9 % — AB (ref 39.0–52.0)
Hemoglobin: 11.4 g/dL — ABNORMAL LOW (ref 13.0–17.0)
MCH: 30.3 pg (ref 26.0–34.0)
MCHC: 30.9 g/dL (ref 30.0–36.0)
MCV: 98.1 fL (ref 78.0–100.0)
Platelets: 248 10*3/uL (ref 150–400)
RBC: 3.76 MIL/uL — ABNORMAL LOW (ref 4.22–5.81)
RDW: 14.8 % (ref 11.5–15.5)
WBC: 9.3 10*3/uL (ref 4.0–10.5)

## 2018-07-18 LAB — TROPONIN I
TROPONIN I: 0.09 ng/mL — AB (ref <0.03)
TROPONIN I: 0.1 ng/mL — AB (ref <0.03)

## 2018-07-18 LAB — GLUCOSE, CAPILLARY
GLUCOSE-CAPILLARY: 172 mg/dL — AB (ref 70–99)
Glucose-Capillary: 151 mg/dL — ABNORMAL HIGH (ref 70–99)

## 2018-07-18 LAB — I-STAT TROPONIN, ED: Troponin i, poc: 0.07 ng/mL (ref 0.00–0.08)

## 2018-07-18 MED ORDER — SERTRALINE HCL 50 MG PO TABS
50.0000 mg | ORAL_TABLET | ORAL | Status: DC
  Administered 2018-07-20: 50 mg via ORAL
  Filled 2018-07-18: qty 1

## 2018-07-18 MED ORDER — ALLOPURINOL 100 MG PO TABS
200.0000 mg | ORAL_TABLET | Freq: Every day | ORAL | Status: DC
  Administered 2018-07-19 – 2018-07-20 (×2): 200 mg via ORAL
  Filled 2018-07-18 (×2): qty 2

## 2018-07-18 MED ORDER — FUROSEMIDE 10 MG/ML IJ SOLN
80.0000 mg | Freq: Once | INTRAMUSCULAR | Status: AC
  Administered 2018-07-18: 80 mg via INTRAVENOUS
  Filled 2018-07-18: qty 8

## 2018-07-18 MED ORDER — SODIUM CHLORIDE 0.9 % IV SOLN: 250.0000 mL | INTRAVENOUS | Status: DC | PRN

## 2018-07-18 MED ORDER — CARVEDILOL 6.25 MG PO TABS
6.2500 mg | ORAL_TABLET | Freq: Two times a day (BID) | ORAL | Status: DC
  Administered 2018-07-18: 6.25 mg via ORAL
  Filled 2018-07-18: qty 1

## 2018-07-18 MED ORDER — ISOSORBIDE DINITRATE ER 40 MG PO CPCR: 40.0000 mg | ORAL_CAPSULE | Freq: Three times a day (TID) | ORAL | Status: DC

## 2018-07-18 MED ORDER — SODIUM CHLORIDE 0.9% FLUSH: 3.0000 mL | INTRAVENOUS | Status: DC | PRN

## 2018-07-18 MED ORDER — ACETAMINOPHEN 325 MG PO TABS: 650.0000 mg | ORAL_TABLET | ORAL | Status: DC | PRN

## 2018-07-18 MED ORDER — HYDROCODONE-ACETAMINOPHEN 5-325 MG PO TABS
2.0000 | ORAL_TABLET | ORAL | Status: DC | PRN
  Administered 2018-07-18: 2 via ORAL
  Filled 2018-07-18: qty 2

## 2018-07-18 MED ORDER — SODIUM CHLORIDE 0.9% FLUSH
3.0000 mL | Freq: Two times a day (BID) | INTRAVENOUS | Status: DC
  Administered 2018-07-18 – 2018-07-20 (×3): 3 mL via INTRAVENOUS

## 2018-07-18 MED ORDER — GLIPIZIDE 5 MG PO TABS
5.0000 mg | ORAL_TABLET | Freq: Every day | ORAL | Status: DC
  Filled 2018-07-18: qty 1

## 2018-07-18 MED ORDER — FINASTERIDE 5 MG PO TABS
5.0000 mg | ORAL_TABLET | Freq: Every day | ORAL | Status: DC
  Administered 2018-07-19 – 2018-07-20 (×2): 5 mg via ORAL
  Filled 2018-07-18 (×2): qty 1

## 2018-07-18 MED ORDER — TAMSULOSIN HCL 0.4 MG PO CAPS
0.4000 mg | ORAL_CAPSULE | Freq: Every day | ORAL | Status: DC
  Administered 2018-07-18 – 2018-07-19 (×2): 0.4 mg via ORAL
  Filled 2018-07-18 (×2): qty 1

## 2018-07-18 MED ORDER — AMIODARONE HCL 200 MG PO TABS
200.0000 mg | ORAL_TABLET | Freq: Two times a day (BID) | ORAL | Status: DC
  Administered 2018-07-18 – 2018-07-20 (×4): 200 mg via ORAL
  Filled 2018-07-18 (×4): qty 1

## 2018-07-18 MED ORDER — APIXABAN 2.5 MG PO TABS
2.5000 mg | ORAL_TABLET | Freq: Two times a day (BID) | ORAL | Status: DC
  Administered 2018-07-18 – 2018-07-20 (×4): 2.5 mg via ORAL
  Filled 2018-07-18 (×4): qty 1

## 2018-07-18 MED ORDER — FUROSEMIDE 10 MG/ML IJ SOLN
40.0000 mg | Freq: Two times a day (BID) | INTRAMUSCULAR | Status: DC
  Administered 2018-07-18: 40 mg via INTRAVENOUS
  Filled 2018-07-18: qty 4

## 2018-07-18 MED ORDER — ISOSORBIDE DINITRATE ER 40 MG PO CPCR
40.0000 mg | ORAL_CAPSULE | Freq: Three times a day (TID) | ORAL | Status: DC
  Administered 2018-07-18 – 2018-07-20 (×5): 40 mg via ORAL
  Filled 2018-07-18 (×7): qty 1

## 2018-07-18 MED ORDER — ROSUVASTATIN CALCIUM 20 MG PO TABS
20.0000 mg | ORAL_TABLET | Freq: Every day | ORAL | Status: DC
  Administered 2018-07-18 – 2018-07-19 (×2): 20 mg via ORAL
  Filled 2018-07-18 (×2): qty 1

## 2018-07-18 MED ORDER — HYDRALAZINE HCL 25 MG PO TABS
12.5000 mg | ORAL_TABLET | Freq: Three times a day (TID) | ORAL | Status: DC
  Administered 2018-07-18 – 2018-07-19 (×3): 12.5 mg via ORAL
  Filled 2018-07-18 (×3): qty 1

## 2018-07-18 NOTE — ED Triage Notes (Signed)
Patient presents to the ED with complaints of sudden onset of Shortness of breathe. Patient reports he has been taking his medications and has not missed a dose. EMS states they gave 1 nitro due to initial BP 190/110. Patient was placed on bipap  With EMS due to RA stats 88% and rales. Patient alert and oriented.

## 2018-07-18 NOTE — H&P (Addendum)
Cardiology Admission History and Physical:   Patient ID: Ryan Macdonald; MRN: [id number]; DOB: May 23, 1934   Admission date: 07/18/2018  Primary Care Provider: [facility] Primary Cardiologist: [name], MD  Primary Electrophysiologist:  [facility]  Chief Complaint:  Dyspnea   Patient Profile:   Ryan Macdonald is a 84 y.o. male with a history of chronic systolic and diastolic HF, NICM, ICD, PAF on chronic anticoagulation w/ Eliquis, h/o PE, HTN, HLD and CKD, presenting to the ED with dyspnea with w/u c/w acute on chronic systolic HF. Pt also with mildly elevated troponin at 0.09.   History of Present Illness:    Ryan Macdonald is a 84 y.o. male with a history of chronic systolic and diastolic heart failure with baseline EF of 15-20%, NICM s/p Biotronik ICD 10/2015 (implanted at [facility]), h/o PAF on chronic anticoagulation w/ Eliquis, h/o PE, HTN, HLD, CKD stage III and NSVT. Per records, he apparently had a LHC done in [place] in 2009 that showed normal coronaries.  [facility] [place] but has been seen by [facility], but not since 2017. He was being followed, around that time, by Dr. Duke Salvia, but lost to f/u. He was admitted to [facility] in June for acute on chronic CHF but managed by IM. Cardiology not consulted. Per notes, he responded well to IV diuretics and was transitioned back to PO diuretics with discharge weight of 246 lb. He had mild troponin elevation that admission but no chest pain and no ischemic w/u pursued. Troponin trend that admit was 0.18>>0.17. Over the years, he has had chronically elevated troponins, ranging from 0.05>>0.18.   He now presents to the [facility] ED with CC of sudden onset of dyspnea. He initially called EMS. Per report, he was noted by EMS to be markedly hypertensive with BP of 190/110 and was dyspneic and hypoxic with O2 sats at 88% on RA. He was started on BiPap and given SL NTG  and transported to the ED.   W/u in the ED suggest acute on chronic CHF. BNP elevated at 295. CXR shows mild cardiomegaly with vascular congestion. BP on arrival to the ED was 149/90. Initial SCr was 1.64. Repeat BMP showed improved SCr at 1.46. K WNL at 4.6. WBC WNL at 9.3. Hgb mildly low at 11.4. Initial troponin abnormal at 0.09. Cyclic troponin's pending. EKG shows NSR vs ectopic rhythm (some p wave morphology variation). Cardiology asked to evaluate.    Pt notes he is feeling better after dose of IV lasix. He reports good UOP in the ED. Breathing improved. No longer on bipap. He is now on [place] @ 2L/min. His only recent symptoms were dyspnea, orthopnea and PND. He denies chest pain, palpitations, dizziness, syncope, near syncope. He has noticed some weight gain, but no significant edema. His abdomen is obese, but he denies any increased abdominal distension. He does however note some weight gain. He is compliant with daily weight and notes that his usual dry weight is ~250 lb. Today he weighed 254 lb at home. He reports full compliance with home diuretics and claims that he has avoided high sodium foods. Hasn't eaten out recently. Does not cook with salt and he rinses all of his canned vegetables. He denies any recent use of ibuprofen. He has been compliant with all of his meds, except for today. BP is elevated in the ED with systolic pressures in the 110s.  Past Medical History:  Diagnosis Date  . Atrial fibrillation (HCC)   . Cardiomyopathy (HCC)   . Cat scratch fever    removed mass on right side neck  . Cataracts, bilateral   . CHF (congestive heart failure) (HCC)   . Chronic kidney disease   . Corns and callosities   . Diabetes mellitus without complication (HCC)    TYPE 2  . Erythema intertrigo   . Flat foot   . Gout   . High cholesterol   . Hx of urethral stricture   . Hypertension   . Kidney stones   . Morbid obesity (HCC)   . Nonischemic cardiomyopathy (HCC)    a. ? 2009  Cath in [place] - nl cors per pt;  b. 09/2013 Echo: EF 25-30%, sev diff HK.  . Obesity   . Osteoarthritis   . PAF (paroxysmal atrial fibrillation) (HCC)    a. post-op hip in 2014 - prev on xarelto.  . Renal insufficiency     Past Surgical History:  Procedure Laterality Date  . CARDIAC CATHETERIZATION  1980's  . CIRCUMCISION  1940's  . CYSTOSCOPY WITH URETHRAL DILATATION  10/23/2013  . CYSTOSCOPY WITH URETHRAL DILATATION N/A 10/23/2013   Procedure: CYSTOSCOPY WITH URETHRAL DILATATION;  Surgeon: Kathi Ludwig, MD;  Location: [facility] OR;  Service: Urology;  Laterality: N/A;  . INCISION AND DRAINAGE OF WOUND Right 1980's   "cat scratch" (10/23/2013)  . INGUINAL HERNIA REPAIR Right 1950's  . KIDNEY STONE SURGERY Right 1970's; 1983   "twice"  . TONSILLECTOMY AND ADENOIDECTOMY  1940's  . TOTAL HIP ARTHROPLASTY Left 10/23/2013   Procedure: TOTAL HIP ARTHROPLASTY;  Surgeon: Valeria Batman, MD;  Location: [facility] OR;  Service: Orthopedics;  Laterality: Left;     Medications Prior to Admission: Prior to Admission medications   Medication Sig Start Date End Date Taking? Authorizing Provider  allopurinol (ZYLOPRIM) 100 MG tablet Take 200 mg by mouth daily.   Yes [provider]  amiodarone (PACERONE) 200 MG tablet Take 1 tablet (200 mg total) by mouth 2 (two) times daily. 02/16/18  Yes Santos-Sanchez, Chelsea Primus, MD  apixaban (ELIQUIS) 2.5 MG TABS tablet Take 1 tablet (2.5 mg total) by mouth 2 (two) times daily. 05/04/18  Yes Angelita Ingles, MD  carvedilol (COREG) 12.5 MG tablet Take 0.5 tablets (6.25 mg total) by mouth 2 (two) times daily with a meal. 04/15/17  Yes Osvaldo Shipper, MD  cyanocobalamin 1000 MCG tablet Take 1,000 mcg by mouth daily.    Yes [provider]  diclofenac sodium (VOLTAREN) 1 % GEL Apply 4 g topically 4 (four) times daily. 07/10/17  Yes Valentino Nose, MD  finasteride (PROSCAR) 5 MG tablet Take 5 mg by mouth daily.   Yes [provider]  fluticasone  furoate-vilanterol (BREO ELLIPTA) 100-25 MCG/INH AEPB Inhale 1 puff into the lungs daily. 05/05/18  Yes Angelita Ingles, MD  glipiZIDE (GLUCOTROL) 5 MG tablet Take 5 mg by mouth daily before breakfast.   Yes [provider]  hydrALAZINE (APRESOLINE) 25 MG tablet Take 0.5 tablets (12.5 mg total) by mouth every 8 (eight) hours. 02/16/18  Yes Burna Cash, MD  HYDROcodone-acetaminophen (NORCO/VICODIN) 5-325 MG tablet Take 2 tablets by mouth every 4 (four) hours as needed. Patient taking differently: Take 1 tablet by mouth 2 (two) times daily as needed for moderate pain.  04/09/16  Yes Isa Rankin, MD  ipratropium-albuterol (DUONEB) 0.5-2.5 (3) MG/3ML SOLN Take 3 mLs by nebulization every 4 (four) hours as needed.  Patient taking differently: Take 3 mLs by nebulization every 4 (four) hours as needed (shortness of breath).  05/14/17  Yes Pearson Grippe, MD  isosorbide dinitrate (DILATRATE-SR) 40 MG CR capsule Take 40 mg by mouth 3 (three) times daily.   Yes [provider]  ketoconazole (NIZORAL) 2 % shampoo Apply 1 application topically 2 (two) times a week.   Yes [provider]  MAGNESIUM PO Take 0.5 tablets by mouth 2 (two) times daily.   Yes [provider]  rosuvastatin (CRESTOR) 20 MG tablet Take 1 tablet (20 mg total) by mouth daily at 6 PM. 05/04/18  Yes Winfrey, Kimberlee Nearing, MD  sertraline (ZOLOFT) 25 MG tablet Take 50 mg by mouth 2 (two) times a week.    Yes [provider]  tamsulosin (FLOMAX) 0.4 MG CAPS capsule Take 0.4 mg by mouth at bedtime.   Yes [provider]  torsemide (DEMADEX) 20 MG tablet Take one 20mg  tablet daily.  However, if weight up 3lbs in 24 hours take an extra dose that day.  If weight is below your dry weight skip your dose that day. 05/04/18  Yes Angelita Ingles, MD     Allergies:    Allergies  Allergen Reactions  . Lisinopril Swelling    Facial swelling  . Xarelto [Rivaroxaban]     bleeding    Social  History:   Social History   Socioeconomic History  . Marital status: Married    Spouse name: Not on file  . Number of children: Not on file  . Years of education: Not on file  . Highest education level: Not on file  Occupational History  . Occupation: Retired    Comment: Former Patent examiner  . Financial resource strain: Not on file  . Food insecurity:    Worry: Not on file    Inability: Not on file  . Transportation needs:    Medical: Not on file    Non-medical: Not on file  Tobacco Use  . Smoking status: Former Smoker    Packs/day: 1.00    Years: 15.00    Pack years: 15.00    Types: Cigarettes, Cigars  . Smokeless tobacco: Former Neurosurgeon  . Tobacco comment: quit smoking ~ 50 yr ago  Substance and Sexual Activity  . Alcohol use: No    Alcohol/week: 2.0 standard drinks    Types: 2 Cans of beer per week    Comment: rare beer.  . Drug use: No  . Sexual activity: Yes  Lifestyle  . Physical activity:    Days per week: Not on file    Minutes per session: Not on file  . Stress: Not on file  Relationships  . Social connections:    Talks on phone: Not on file    Gets together: Not on file    Attends religious service: Not on file    Active member of club or organization: Not on file    Attends meetings of clubs or organizations: Not on file    Relationship status: Not on file  . Intimate partner violence:    Fear of current or ex partner: Not on file    Emotionally abused: Not on file    Physically abused: Not on file    Forced sexual activity: Not on file  Other Topics Concern  . Not on file  Social History Narrative   Lives in [place] with wife. Moved from [place] (2013). Does not routinely exercise.    Family  History:   The patient's family history includes Heart Problems in his maternal grandmother; Heart disease in his mother; Other in his unknown relative.    ROS:  Please see the history of present illness.  All other ROS reviewed and negative.      Physical Exam/Data:   Vitals:   07/18/18 1130 07/18/18 1145 07/18/18 1200 07/18/18 1215  BP: 137/82 132/81 (!) 147/77 138/78  Pulse: 72 71 74 72  Resp: (!) 22 (!) 24 18 (!) 23  SpO2: 96% 97% 98% 97%  Weight:      Height:        Intake/Output Summary (Last 24 hours) at 07/18/2018 1334 Last data filed at 07/18/2018 1232 Gross per 24 hour  Intake -  Output 1750 ml  Net -1750 ml   Filed Weights   07/18/18 0732  Weight: 113.4 kg   Body mass index is 39.16 kg/m.  General:  Obese AAM, in NAD HEENT: normal Lymph: no adenopathy Neck: mild JVD Endocrine:  No thryomegaly Vascular: No carotid bruits; FA pulses 2+ bilaterally without bruits  Cardiac:  normal S1, S2; RRR; no murmur  Lungs:  clear to auscultation bilaterally, no wheezing, rhonchi or rales  Abd: obese, mildly distended,soft, nontender, no hepatomegaly  Ext: 1 + pretibial edema Musculoskeletal:  No deformities, BUE and BLE strength normal and equal Skin: warm and dry  Neuro:  CNs 2-12 intact, no focal abnormalities noted Psych:  Normal affect    EKG:  The ECG that was done 07/18/2018 was personally reviewed and demonstrates SR ? Ectopic rhythm (variable P wave morphology). 1st degree AVB  Relevant CV Studies: 2D Echo 04/30/18 Study Conclusions  - Left ventricle: The cavity size was moderately dilated. Wall   thickness was normal. Systolic function was severely reduced. The   estimated ejection fraction was in the range of 20% to 25%.   Diffuse hypokinesis. Features are consistent with a pseudonormal   left ventricular filling pattern, with concomitant abnormal   relaxation and increased filling pressure (grade 2 diastolic   dysfunction). Doppler parameters are consistent with high   ventricular filling pressure. - Aortic valve: There was trivial regurgitation. - Mitral valve: There was mild regurgitation. Valve area by   pressure half-time: 1.09 cm^2. - Left atrium: The atrium was mildly dilated. - Right  ventricle: Pacer wire or catheter noted in right ventricle.   Systolic function was reduced.  Laboratory Data:  Chemistry Recent Labs  Lab 07/18/18 0824 07/18/18 0832  NA 139 141  K 4.0 4.6  CL 110 106  CO2 21* 24  GLUCOSE 172* 114*  BUN 21 20  CREATININE 1.64* 1.46*  CALCIUM 8.5* 9.1  GFRNONAA 37* 43*  GFRAA 43* 50*  ANIONGAP 8 11    No results for input(s): PROT, ALBUMIN, AST, ALT, ALKPHOS, BILITOT in the last 168 hours. Hematology Recent Labs  Lab 07/18/18 0743  WBC 9.3  RBC 3.76*  HGB 11.4*  HCT 36.9*  MCV 98.1  MCH 30.3  MCHC 30.9  RDW 14.8  PLT 248   Cardiac Enzymes Recent Labs  Lab 07/18/18 1025  TROPONINI 0.09*    Recent Labs  Lab 07/18/18 0745  TROPIPOC 0.07    BNP Recent Labs  Lab 07/18/18 0744  BNP 295.5*    DDimer No results for input(s): DDIMER in the last 168 hours.  Radiology/Studies:  Dg Chest Port 1 View  Result Date: 07/18/2018 CLINICAL DATA:  Shortness of breath EXAM: PORTABLE CHEST 1 VIEW COMPARISON:  05/02/2018 FINDINGS: Left  AICD remains in place, unchanged. Mild cardiomegaly. No confluent airspace opacities or effusions. Mild vascular congestion. No acute bony abnormality. IMPRESSION: Mild cardiomegaly, vascular congestion. Electronically Signed   By: Charlett Nose M.D.   On: 07/18/2018 07:56    Assessment and Plan:   Byrd Rushlow is a 84 y.o. male with a history of chronic systolic and diastolic HF, NICM, ICD, PAF chronic anticoagulation w/ Eliquis, h/o PE, HTN, HLD and CKD, presenting to the ED with dyspnea with w/u c/w acute on chronic systolic HF. Pt also with mildly elevated troponin at 0.09.   1. Acute on Chronic CHF: home weight this morning was up 4 lb above normal dry weight (250 lb). He weighed 254 lb today. BNP elevated at 295 lb. CXR with vascular congestion. Respiratory status and symptoms improved with IV Lasix. Bipap has been discontinued and now on [place] @2L /min. He is diuresing well with IV Lasix. Good UOP in the  ED. No crackles on exam but 1+ bilateral pretibial edema noted. BP is elevated and needs better control. We will admit to telemetry and will continue IV Lasix. Strict I/Os, daily weights and f/u BMP in the morning to monitor renal function and electrolytes.   2. NICM: baseline EF of 20-25%. Per records, he apparently had a LHC done in [place] in 2009 that showed normal coronaries. He has an ICD for primary prevention. Continue BB, hydralazine and Imdur. No ACE/ARB due to h/o angioedema.   3. ICD: s/p Biotronik ICD 10/2015 (implanted at [facility]).   4.  PAF:  NSR on EKG. Continue amiodarone and Eliquis.   5. CKD: Baseline SCr since June has been in the 1.4-1.6 range. SCr today 1.46. Will monitor closely while diuresing.   6. Elevated Troponin: 0.09 today. He denies CP.  He had mild troponin elevation previous admission in June for CHF but no chest pain and no ischemic w/u pursued. Troponin trend that admit was 0.18>>0.17. Over the years, he has had chronically elevated troponins, ranging from 0.05>>0.18. This is likely demand ischemia from acute CHF and HTN.  7. HTN: elevated in the ED currently. Pt missed his morning meds due to acute condition. Will restart meds. Can push hydralazine dose for better control. No ACE/ARB due to h/o angioedema. Continue Coreg and Imdur.   Severity of Illness: The appropriate patient status for this patient is INPATIENT. Inpatient status is judged to be reasonable and necessary in order to provide the required intensity of service to ensure the patient's safety. The patient's presenting symptoms, physical exam findings, and initial radiographic and laboratory data in the context of their chronic comorbidities is felt to place them at high risk for further clinical deterioration. Furthermore, it is not anticipated that the patient will be medically stable for discharge from the hospital within 2 midnights of admission. The following factors support the patient  status of inpatient.   " The patient's presenting symptoms include dyspnea. " The worrisome physical exam findings include elevated BP, edema " The initial radiographic and laboratory data are worrisome because of abnormal CXR and elevated BNP. " The chronic co-morbidities include Systolic HF, HTN, CKD, HLD.   * I certify that at the point of admission it is my clinical judgment that the patient will require inpatient hospital care spanning beyond 2 midnights from the point of admission due to high intensity of service, high risk for further deterioration and high frequency of surveillance required.*    For questions or updates, please contact [facility] Please  consult [email or web address] for contact info under Cardiology/STEMI.    Signed, Robbie Lis, PA-C  07/18/2018 1:34 PM  As above, patient seen and examined.  Briefly he is an 84 year old male with past medical history of nonischemic cardiomyopathy, chronic combined systolic/diastolic congestive heart failure, paroxysmal atrial fibrillation, prior ICD, hypertension, hyperlipidemia, chronic stage III kidney disease with acute on chronic combined systolic/diastolic congestive heart failure.  Patient states he typically does not have dyspnea on exertion, orthopnea, chest pain or syncope.  He does have chronic mild pedal edema.  He began developing increased dyspnea last evening which worsened today.  He presented to the emergency room and cardiology asked to evaluate.  He denies fevers, chills or productive cough.  Creatinine 1.46.  BNP 295.  Troponin 0 0.09.  Electrocardiogram shows sinus rhythm, first 3 AV block, intermittent ventricular pacing, nonspecific ST changes.  1 acute on chronic combined systolic/diastolic congestive heart failure-patient is volume overloaded.  We will treat with Lasix 40 mg IV twice daily.  Follow renal function.  Continue fluid restriction, low-sodium diet.  Patient will weigh daily at home and take additional  Demadex for weight gain of 2 to 3 pounds.  2 paroxysmal atrial fibrillation-patient is in sinus rhythm.  Continue amiodarone. CHADSvasc 5.  Continue apixaban.  3 nonischemic cardiomyopathy-continue beta-blocker, hydralazine and nitrates.  Increase as tolerated.  Patient has a history of angioedema to ACE inhibitors.  4 hypertension-follow blood pressure and increase medications as needed.  5 chronic stage III kidney disease-follow renal function closely with diuresis.  6 minimally elevated troponin-no chest pain.  No plans for further ischemia evaluation.  Olga Millers, MD

## 2018-07-18 NOTE — ED Notes (Signed)
EKG given to Dr Belfi 

## 2018-07-18 NOTE — Progress Notes (Signed)
Patient is off of bipap and currently on a 2L [place].

## 2018-07-18 NOTE — ED Provider Notes (Addendum)
[name] EMERGENCY DEPARTMENT Provider Note   CSN: [id number] Arrival date & time: 07/18/18  4132     History   Chief Complaint Chief Complaint  Patient presents with  . Shortness of Breath    HPI Ryan Macdonald is a 84 y.o. male.  Patient is a 84 year old male with a history of diabetes, hypertension, hyperlipidemia, chronic kidney disease, CHF and atrial fibrillation on Eliquis who presents with shortness of breath.  He states that started last night and he was unable to sleep much last night due to shortness of breath.  It got worse this morning.  He reports about a 2 to 3 pound weight gain and feels that he is fluid overloaded.  He denies any increase in lower extremity swelling.  No fevers.  No productive cough.  No associated chest pain.  No vomiting.  He was started on BiPAP by EMS and given 1 sublingual nitro and he is feeling better following this treatment.     Past Medical History:  Diagnosis Date  . Atrial fibrillation (HCC)   . Cardiomyopathy (HCC)   . Cat scratch fever    removed mass on right side neck  . Cataracts, bilateral   . CHF (congestive heart failure) (HCC)   . Chronic kidney disease   . Corns and callosities   . Diabetes mellitus without complication (HCC)    TYPE 2  . Erythema intertrigo   . Flat foot   . Gout   . High cholesterol   . Hx of urethral stricture   . Hypertension   . Kidney stones   . Morbid obesity (HCC)   . Nonischemic cardiomyopathy (HCC)    a. ? 2009 Cath in [place] - nl cors per pt;  b. 09/2013 Echo: EF 25-30%, sev diff HK.  . Obesity   . Osteoarthritis   . PAF (paroxysmal atrial fibrillation) (HCC)    a. post-op hip in 2014 - prev on xarelto.  . Renal insufficiency     Patient Active Problem List   Diagnosis Date Noted  . Acute on chronic combined systolic and diastolic congestive heart failure (HCC) 07/18/2018  . First degree AV block   . Shortness of breath 04/29/2018  . Acute HFrEF (heart failure  with reduced ejection fraction) (HCC) 04/29/2018  . Acute respiratory failure with hypoxia (HCC) 09/27/2017  . CHF exacerbation (HCC) 09/27/2017  . Renal insufficiency   . Acute on chronic systolic CHF (congestive heart failure) (HCC) 07/08/2017  . Left knee pain 07/08/2017  . Acute bronchitis 05/10/2017  . Acute congestive heart failure (HCC) 04/14/2017  . Elevated troponin 04/14/2017  . Lactic acidosis   . Acute on chronic systolic heart failure, NYHA class 2 (HCC) 02/03/2017  . Diabetes mellitus type 2 in obese (HCC) 02/03/2017  . CAP (community acquired pneumonia) 02/03/2017  . Lobar pneumonia (HCC)   . Diabetes mellitus with complication (HCC)   . Acute on chronic respiratory failure with hypoxia (HCC)   . Acute on chronic combined systolic and diastolic heart failure (HCC) 05/19/2016  . Depression 05/19/2016  . Gout 05/19/2016  . 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 NSVT (nonsustained ventricular tachycardia) (HCC)   . Chronic renal insufficiency, stage 3 (moderate) (HCC)   . Pulmonary embolism (HCC)   . Acute respiratory failure (HCC)   . Osteoarthritis of left hip 10/25/2013  . Atrial fibrillation (HCC) 10/25/2013  . S/P hip replacement 10/24/2013  . Nonischemic cardiomyopathy (HCC)   . Obesity (BMI 30-39.9)   .  Essential hypertension   . Hyperlipidemia     Past Surgical History:  Procedure Laterality Date  . CARDIAC CATHETERIZATION  1980's  . CIRCUMCISION  1940's  . CYSTOSCOPY WITH URETHRAL DILATATION  10/23/2013  . CYSTOSCOPY WITH URETHRAL DILATATION N/A 10/23/2013   Procedure: CYSTOSCOPY WITH URETHRAL DILATATION;  Surgeon: Kathi Ludwig, MD;  Location: [facility] OR;  Service: Urology;  Laterality: N/A;  . INCISION AND DRAINAGE OF WOUND Right 1980's   "cat scratch" (10/23/2013)  . INGUINAL HERNIA REPAIR Right 1950's  . KIDNEY STONE SURGERY Right 1970's; 1983   "twice"  . TONSILLECTOMY AND ADENOIDECTOMY  1940's  . TOTAL HIP  ARTHROPLASTY Left 10/23/2013   Procedure: TOTAL HIP ARTHROPLASTY;  Surgeon: Valeria Batman, MD;  Location: [facility] OR;  Service: Orthopedics;  Laterality: Left;        Home Medications    Prior to Admission medications   Medication Sig Start Date End Date Taking? Authorizing Provider  allopurinol (ZYLOPRIM) 100 MG tablet Take 200 mg by mouth daily.   Yes [provider]  amiodarone (PACERONE) 200 MG tablet Take 1 tablet (200 mg total) by mouth 2 (two) times daily. 02/16/18  Yes Santos-Sanchez, Chelsea Primus, MD  apixaban (ELIQUIS) 2.5 MG TABS tablet Take 1 tablet (2.5 mg total) by mouth 2 (two) times daily. 05/04/18  Yes Angelita Ingles, MD  carvedilol (COREG) 12.5 MG tablet Take 0.5 tablets (6.25 mg total) by mouth 2 (two) times daily with a meal. 04/15/17  Yes Osvaldo Shipper, MD  cyanocobalamin 1000 MCG tablet Take 1,000 mcg by mouth daily.    Yes [provider]  diclofenac sodium (VOLTAREN) 1 % GEL Apply 4 g topically 4 (four) times daily. 07/10/17  Yes Valentino Nose, MD  finasteride (PROSCAR) 5 MG tablet Take 5 mg by mouth daily.   Yes [provider]  fluticasone furoate-vilanterol (BREO ELLIPTA) 100-25 MCG/INH AEPB Inhale 1 puff into the lungs daily. 05/05/18  Yes Angelita Ingles, MD  glipiZIDE (GLUCOTROL) 5 MG tablet Take 5 mg by mouth daily before breakfast.   Yes [provider]  hydrALAZINE (APRESOLINE) 25 MG tablet Take 0.5 tablets (12.5 mg total) by mouth every 8 (eight) hours. 02/16/18  Yes Burna Cash, MD  HYDROcodone-acetaminophen (NORCO/VICODIN) 5-325 MG tablet Take 2 tablets by mouth every 4 (four) hours as needed. Patient taking differently: Take 1 tablet by mouth 2 (two) times daily as needed for moderate pain.  04/09/16  Yes Isa Rankin, MD  ipratropium-albuterol (DUONEB) 0.5-2.5 (3) MG/3ML SOLN Take 3 mLs by nebulization every 4 (four) hours as needed. Patient taking differently: Take 3 mLs by nebulization every 4 (four) hours as  needed (shortness of breath).  05/14/17  Yes Pearson Grippe, MD  isosorbide dinitrate (DILATRATE-SR) 40 MG CR capsule Take 40 mg by mouth 3 (three) times daily.   Yes [provider]  ketoconazole (NIZORAL) 2 % shampoo Apply 1 application topically 2 (two) times a week.   Yes [provider]  MAGNESIUM PO Take 0.5 tablets by mouth 2 (two) times daily.   Yes [provider]  rosuvastatin (CRESTOR) 20 MG tablet Take 1 tablet (20 mg total) by mouth daily at 6 PM. 05/04/18  Yes Winfrey, Kimberlee Nearing, MD  sertraline (ZOLOFT) 25 MG tablet Take 50 mg by mouth 2 (two) times a week.    Yes [provider]  tamsulosin (FLOMAX) 0.4 MG CAPS capsule Take 0.4 mg by mouth at bedtime.   Yes [provider]  torsemide (DEMADEX) 20  MG tablet Take one 20mg  tablet daily.  However, if weight up 3lbs in 24 hours take an extra dose that day.  If weight is below your dry weight skip your dose that day. 05/04/18  Yes Angelita Ingles, MD    Family History Family History  Problem Relation Age of Onset  . Heart disease Mother   . Heart Problems Maternal Grandmother   . Other Unknown        negative for premature CAD    Social History Social History   Tobacco Use  . Smoking status: Former Smoker    Packs/day: 1.00    Years: 15.00    Pack years: 15.00    Types: Cigarettes, Cigars  . Smokeless tobacco: Former Neurosurgeon  . Tobacco comment: quit smoking ~ 50 yr ago  Substance Use Topics  . Alcohol use: No    Alcohol/week: 2.0 standard drinks    Types: 2 Cans of beer per week    Comment: rare beer.  . Drug use: No     Allergies   Lisinopril and Xarelto [rivaroxaban]   Review of Systems Review of Systems  Constitutional: Positive for unexpected weight change. Negative for chills, diaphoresis, fatigue and fever.  HENT: Negative for congestion, rhinorrhea and sneezing.   Eyes: Negative.   Respiratory: Positive for shortness of breath. Negative for cough and chest tightness.    Cardiovascular: Negative for chest pain and leg swelling.  Gastrointestinal: Negative for abdominal pain, blood in stool, diarrhea, nausea and vomiting.  Genitourinary: Negative for difficulty urinating, flank pain, frequency and hematuria.  Musculoskeletal: Negative for arthralgias and back pain.  Skin: Negative for rash.  Neurological: Negative for dizziness, speech difficulty, weakness, numbness and headaches.     Physical Exam Updated Vital Signs BP 120/70   Pulse 78   Resp (!) 23   Ht 5\' 7"  (1.702 m)   Wt 113.4 kg   SpO2 97%   BMI 39.16 kg/m   Physical Exam  Constitutional: He is oriented to person, place, and time. He appears well-developed and well-nourished.  HENT:  Head: Normocephalic and atraumatic.  Eyes: Pupils are equal, round, and reactive to light.  Neck: Normal range of motion. Neck supple.  Cardiovascular: Normal rate, regular rhythm and normal heart sounds.  Pulmonary/Chest: Effort normal. Tachypnea noted. No respiratory distress. He has no wheezes. He has rales. He exhibits no tenderness.  Abdominal: Soft. Bowel sounds are normal. There is no tenderness. There is no rebound and no guarding.  Musculoskeletal: Normal range of motion.       Right lower leg: He exhibits edema.       Left lower leg: He exhibits edema.  2+ edema bilaterally  Lymphadenopathy:    He has no cervical adenopathy.  Neurological: He is alert and oriented to person, place, and time.  Skin: Skin is warm and dry. No rash noted.  Psychiatric: He has a normal mood and affect.     ED Treatments / Results  Labs (all labs ordered are listed, but only abnormal results are displayed) Labs Reviewed  CBC - Abnormal; Notable for the following components:      Result Value   RBC 3.76 (*)    Hemoglobin 11.4 (*)    HCT 36.9 (*)    All other components within normal limits  BRAIN NATRIURETIC PEPTIDE - Abnormal; Notable for the following components:   B Natriuretic Peptide 295.5 (*)    All  other components within normal limits  BASIC METABOLIC PANEL - Abnormal;  Notable for the following components:   Glucose, Bld 114 (*)    Creatinine, Ser 1.46 (*)    GFR calc non Af Amer 43 (*)    GFR calc Af Amer 50 (*)    All other components within normal limits  BASIC METABOLIC PANEL - Abnormal; Notable for the following components:   CO2 21 (*)    Glucose, Bld 172 (*)    Creatinine, Ser 1.64 (*)    Calcium 8.5 (*)    GFR calc non Af Amer 37 (*)    GFR calc Af Amer 43 (*)    All other components within normal limits  TROPONIN I - Abnormal; Notable for the following components:   Troponin I 0.09 (*)    All other components within normal limits  I-STAT TROPONIN, ED    EKG EKG Interpretation  Date/Time:  Tuesday July 18 2018 12:35:58 EDT Ventricular Rate:  73 PR Interval:    QRS Duration: 111 QT Interval:  408 QTC Calculation: 450 R Axis:   42 Text Interpretation:  Sinus or ectopic atrial rhythm Prolonged PR interval Low voltage, precordial leads Borderline repolarization abnormality since last tracing no significant change Confirmed by Rolan Bucco [phone]) on 07/18/2018 3:34:04 PM   Radiology Dg Chest Port 1 View  Result Date: 07/18/2018 CLINICAL DATA:  Shortness of breath EXAM: PORTABLE CHEST 1 VIEW COMPARISON:  05/02/2018 FINDINGS: Left AICD remains in place, unchanged. Mild cardiomegaly. No confluent airspace opacities or effusions. Mild vascular congestion. No acute bony abnormality. IMPRESSION: Mild cardiomegaly, vascular congestion. Electronically Signed   By: Charlett Nose M.D.   On: 07/18/2018 07:56    Procedures Procedures (including critical care time)  Medications Ordered in ED Medications  furosemide (LASIX) injection 80 mg (80 mg Intravenous Given 07/18/18 0953)     Initial Impression / Assessment and Plan / ED Course  I have reviewed the triage vital signs and the nursing notes.  Pertinent labs & imaging results that were available during my care of  the patient were reviewed by me and considered in my medical decision making (see chart for details).     Patient is a 84 year old male who presents with shortness of breath.  He has evidence of vascular congestion.  He was given a dose of Lasix in the ED.  He was initially on BiPAP but has been transitioned to a nasal cannula.  He is doing well on nasal cannula.  His EKG does not show any ischemic changes.  His troponin is mildly elevated on the second value.  I spoke with cardiology who will admit the patient for further treatment.  CRITICAL CARE Performed by: Rolan Bucco Total critical care time: 60 minutes Critical care time was exclusive of separately billable procedures and treating other patients. Critical care was necessary to treat or prevent imminent or life-threatening deterioration. Critical care was time spent personally by me on the following activities: development of treatment plan with patient and/or surrogate as well as nursing, discussions with consultants, evaluation of patient's response to treatment, examination of patient, obtaining history from patient or surrogate, ordering and performing treatments and interventions, ordering and review of laboratory studies, ordering and review of radiographic studies, pulse oximetry and re-evaluation of patient's condition.   Final Clinical Impressions(s) / ED Diagnoses   Final diagnoses:  Acute on chronic congestive heart failure, unspecified heart failure type (HCC)  Elevated troponin    ED Discharge Orders    None       Rolan Bucco, MD 07/18/18  1537    Rolan Bucco, MD 08/02/18 1158

## 2018-07-19 LAB — BASIC METABOLIC PANEL
Anion gap: 7 (ref 5–15)
BUN: 24 mg/dL — ABNORMAL HIGH (ref 8–23)
CHLORIDE: 106 mmol/L (ref 98–111)
CO2: 26 mmol/L (ref 22–32)
Calcium: 8.9 mg/dL (ref 8.9–10.3)
Creatinine, Ser: 1.6 mg/dL — ABNORMAL HIGH (ref 0.61–1.24)
GFR calc non Af Amer: 38 mL/min — ABNORMAL LOW (ref >60)
GFR, EST AFRICAN AMERICAN: 45 mL/min — AB (ref >60)
GLUCOSE: 126 mg/dL — AB (ref 70–99)
POTASSIUM: 4.8 mmol/L (ref 3.5–5.1)
Sodium: 139 mmol/L (ref 135–145)

## 2018-07-19 LAB — GLUCOSE, CAPILLARY
GLUCOSE-CAPILLARY: 82 mg/dL (ref 70–99)
GLUCOSE-CAPILLARY: 107 mg/dL — AB (ref 70–99)
GLUCOSE-CAPILLARY: 132 mg/dL — AB (ref 70–99)
Glucose-Capillary: 110 mg/dL — ABNORMAL HIGH (ref 70–99)

## 2018-07-19 LAB — TROPONIN I
TROPONIN I: 0.11 ng/mL — AB (ref <0.03)
Troponin I: 0.1 ng/mL (ref <0.03)

## 2018-07-19 MED ORDER — FUROSEMIDE 10 MG/ML IJ SOLN
40.0000 mg | Freq: Two times a day (BID) | INTRAMUSCULAR | Status: DC
  Administered 2018-07-19 – 2018-07-20 (×3): 40 mg via INTRAVENOUS
  Filled 2018-07-19 (×3): qty 4

## 2018-07-19 MED ORDER — GLIPIZIDE 5 MG PO TABS
5.0000 mg | ORAL_TABLET | Freq: Every day | ORAL | Status: DC
  Administered 2018-07-19 – 2018-07-20 (×2): 5 mg via ORAL
  Filled 2018-07-19: qty 1

## 2018-07-19 MED ORDER — DOCUSATE SODIUM 100 MG PO CAPS
100.0000 mg | ORAL_CAPSULE | Freq: Two times a day (BID) | ORAL | Status: DC | PRN
  Administered 2018-07-19 (×3): 100 mg via ORAL
  Filled 2018-07-19 (×3): qty 1

## 2018-07-19 MED ORDER — CARVEDILOL 6.25 MG PO TABS
6.2500 mg | ORAL_TABLET | Freq: Two times a day (BID) | ORAL | Status: DC
  Administered 2018-07-19 – 2018-07-20 (×3): 6.25 mg via ORAL
  Filled 2018-07-19 (×3): qty 1

## 2018-07-19 NOTE — Progress Notes (Signed)
Pt reports he was told not to take Torsemide anymore prior to admission.

## 2018-07-19 NOTE — Progress Notes (Signed)
   SUBJECTIVE Patient with a history of diabetes mellitus presents to office today complaining of elongated, thickened nails that cause pain while ambulating in shoes. He is unable to trim his own nails. Patient is here for further evaluation and treatment.   Past Medical History:  Diagnosis Date  . Atrial fibrillation (HCC)   . Cardiomyopathy (HCC)   . Cat scratch fever    removed mass on right side neck  . Cataracts, bilateral   . CHF (congestive heart failure) (HCC)   . Chronic kidney disease   . Corns and callosities   . Diabetes mellitus without complication (HCC)    TYPE 2  . Dyspnea   . Erythema intertrigo   . Flat foot   . Gout   . High cholesterol   . Hx of urethral stricture   . Hypertension   . Kidney stones   . Morbid obesity (HCC)   . Nonischemic cardiomyopathy (HCC)    a. ? 2009 Cath in [place] - nl cors per pt;  b. 09/2013 Echo: EF 25-30%, sev diff HK.  . Obesity   . Osteoarthritis   . PAF (paroxysmal atrial fibrillation) (HCC)    a. post-op hip in 2014 - prev on xarelto.  . Renal insufficiency     OBJECTIVE General Patient is awake, alert, and oriented x 3 and in no acute distress. Derm Skin is dry and supple bilateral. Negative open lesions or macerations. Remaining integument unremarkable. Nails are tender, long, thickened and dystrophic with subungual debris, consistent with onychomycosis, 1-5 bilateral. No signs of infection noted. Vasc  DP and PT pedal pulses palpable bilaterally. Temperature gradient within normal limits.  Neuro Epicritic and protective threshold sensation diminished bilaterally.  Musculoskeletal Exam No symptomatic pedal deformities noted bilateral. Muscular strength within normal limits.  ASSESSMENT 1. Diabetes Mellitus w/ peripheral neuropathy 2. Onychomycosis of nail due to dermatophyte bilateral 3. Pain in foot bilateral  PLAN OF CARE 1. Patient evaluated today. 2. Instructed to maintain good pedal hygiene and foot care. Stressed  importance of controlling blood sugar.  3. Mechanical debridement of nails 1-5 bilaterally performed using a nail nipper. Filed with dremel without incident.  4. Return to clinic in 3 mos.     Rosangela Fehrenbach M. Jaheim Canino, DPM [facility]  Dr. Anaiya Wisinski M. Taneia Mealor, DPM    [address]                                        [place], [place] [address]                Office [phone]  Fax [phone]      

## 2018-07-19 NOTE — Care Management Note (Addendum)
Case Management Note  Patient Details  Name: Ryan Macdonald MRN: [id number] Date of Birth: November 19, 1934  Subjective/Objective:  CHF                 Action/Plan: 07/19/2018- Pt known to me from previous admission, noted admitted 3 times in 6 months;Patient asked CM to call his PCP at the [facility] to make him aware of his admission ( tel # [phone]); message left as requested. CM will continue to follow for progression of care. Ryan Marthann Abshier RN,MHA,BSN  2:15 pm - Received call from Dr Tereasa Coop at the [facility]; she wants to be called at discharge and fax dc summary. Ryan Derrick RN,MHA,BSN   05/02/2018 -Llves at home with his spouse; goes to the [facility] for medical Care. Patient goes to the [facility] for primary care and for his medication. [phone]- follow the prompts #1 [facility], Dr Harrie Jeans # [phone]. Patient stated that if his prescriptions are sent today, they will send him the medication the next day; Attending MD, please put the patient last 4 numbers of his social security number on the prescription for the [facility] to identify the patient; ( [id number]).Ryan Macdonald [facility] [phone]  Expected Discharge Date:  Possibly 07/23/2018             Expected Discharge Plan:   Home  Status of Service:   In progress  Ryan Macdonald [phone] 07/19/2018, 10:41 AM

## 2018-07-19 NOTE — Progress Notes (Signed)
The documentation by T. Corliss Blacker, nursing student at [employer], is accurate to the best of my  Knowledge.

## 2018-07-19 NOTE — Discharge Instructions (Signed)

## 2018-07-19 NOTE — Progress Notes (Signed)
Progress Note  Patient Name: Ryan Macdonald Date of Encounter: 07/19/2018  Primary Cardiologist: [name], MD   Subjective   No CP; dyspnea improving.  Inpatient Medications    Scheduled Meds: . allopurinol  200 mg Oral Daily  . amiodarone  200 mg Oral BID  . apixaban  2.5 mg Oral BID  . carvedilol  6.25 mg Oral BID WC  . finasteride  5 mg Oral Daily  . furosemide  40 mg Intravenous BID  . glipiZIDE  5 mg Oral QAC breakfast  . hydrALAZINE  12.5 mg Oral Q8H  . isosorbide dinitrate  40 mg Oral TID  . rosuvastatin  20 mg Oral q1800  . [START ON 07/20/2018] sertraline  50 mg Oral Once per day on Mon Thu  . sodium chloride flush  3 mL Intravenous Q12H  . tamsulosin  0.4 mg Oral QHS   Continuous Infusions: . sodium chloride     PRN Meds: sodium chloride, acetaminophen, docusate sodium, HYDROcodone-acetaminophen, sodium chloride flush   Vital Signs    Vitals:   07/19/18 0005 07/19/18 0358 07/19/18 0800 07/19/18 0908  BP: 123/73 94/63 117/82 120/67  Pulse: 70 68  69  Resp: 18 20 20    Temp: 97.9 F (36.6 C) 97.9 F (36.6 C) 97.8 F (36.6 C) 98.3 F (36.8 C)  TempSrc: Oral Oral Oral Oral  SpO2: 100% 97% 99% 93%  Weight:  115.2 kg    Height:        Intake/Output Summary (Last 24 hours) at 07/19/2018 1012 Last data filed at 07/19/2018 0800 Gross per 24 hour  Intake 560 ml  Output 3235 ml  Net -2675 ml   Filed Weights   07/18/18 0732 07/18/18 1626 07/19/18 0358  Weight: 113.4 kg 115.3 kg 115.2 kg    Telemetry    Sinus with occasional Vpacing- Personally Reviewed  Physical Exam   GEN: No acute distress.   Neck: supple Cardiac: RRR, no murmurs, rubs, or gallops.  Respiratory: Clear to auscultation bilaterally. GI: Soft, nontender, non-distended  MS: Trace edema Neuro:  Nonfocal  Psych: Normal affect   Labs    Chemistry Recent Labs  Lab 07/18/18 0824 07/18/18 0832 07/19/18 0427  NA 139 141 139  K 4.0 4.6 4.8  CL 110 106 106  CO2 21* 24  26  GLUCOSE 172* 114* 126*  BUN 21 20 24*  CREATININE 1.64* 1.46* 1.60*  CALCIUM 8.5* 9.1 8.9  GFRNONAA 37* 43* 38*  GFRAA 43* 50* 45*  ANIONGAP 8 11 7      Hematology Recent Labs  Lab 07/18/18 0743  WBC 9.3  RBC 3.76*  HGB 11.4*  HCT 36.9*  MCV 98.1  MCH 30.3  MCHC 30.9  RDW 14.8  PLT 248    Cardiac Enzymes Recent Labs  Lab 07/18/18 1025 07/18/18 1630 07/18/18 2241 07/19/18 0427  TROPONINI 0.09* 0.10* 0.11* 0.10*    Recent Labs  Lab 07/18/18 0745  TROPIPOC 0.07     BNP Recent Labs  Lab 07/18/18 0744  BNP 295.5*       Radiology    Dg Chest Port 1 View  Result Date: 07/18/2018 CLINICAL DATA:  Shortness of breath EXAM: PORTABLE CHEST 1 VIEW COMPARISON:  05/02/2018 FINDINGS: Left AICD remains in place, unchanged. Mild cardiomegaly. No confluent airspace opacities or effusions. Mild vascular congestion. No acute bony abnormality. IMPRESSION: Mild cardiomegaly, vascular congestion. Electronically Signed   By: Charlett Nose M.D.   On: 07/18/2018 07:56    Patient Profile  84 year old male with past medical history of nonischemic cardiomyopathy, chronic combined systolic/diastolic congestive heart failure, paroxysmal atrial fibrillation, prior ICD, hypertension, hyperlipidemia, chronic stage III kidney disease with acute on chronic combined systolic/diastolic congestive heart failure.    Assessment & Plan    1 acute on chronic combined systolic/diastolic congestive heart failure-I/O - 2715.  Patient's symptoms are improving but not back to baseline.  Continue Lasix 40 mg IV twice daily.  Follow renal function.  Continue fluid restriction, low-sodium diet.  Patient will weigh daily at home and take additional Demadex for weight gain of 2 to 3 pounds.  2 paroxysmal atrial fibrillation-patient is in sinus rhythm.  Continue amiodarone. CHADSvasc 5.  Continue apixaban.  3 nonischemic cardiomyopathy-continue beta-blocker, hydralazine and nitrates.  Increase as  tolerated.  Patient has a history of angioedema to ACE inhibitors.  4 hypertension-blood pressure is improved today.  Continue present medications and follow.  5 chronic stage III kidney disease-repeat laboratories tomorrow morning.  6 minimally elevated troponin-no chest pain.  No plans for further ischemia evaluation.  7 normocytic anemia-likely related to renal insufficiency.  For questions or updates, please contact [facility] Please consult [email or web address] for contact info under Cardiology/STEMI.      Signed, Olga Millers, MD  07/19/2018, 10:12 AM

## 2018-07-20 LAB — GLUCOSE, CAPILLARY
GLUCOSE-CAPILLARY: 154 mg/dL — AB (ref 70–99)
Glucose-Capillary: 91 mg/dL (ref 70–99)

## 2018-07-20 LAB — BASIC METABOLIC PANEL
Anion gap: 6 (ref 5–15)
BUN: 30 mg/dL — ABNORMAL HIGH (ref 8–23)
CALCIUM: 8.8 mg/dL — AB (ref 8.9–10.3)
CO2: 29 mmol/L (ref 22–32)
Chloride: 104 mmol/L (ref 98–111)
Creatinine, Ser: 1.63 mg/dL — ABNORMAL HIGH (ref 0.61–1.24)
GFR, EST AFRICAN AMERICAN: 44 mL/min — AB (ref >60)
GFR, EST NON AFRICAN AMERICAN: 38 mL/min — AB (ref >60)
Glucose, Bld: 148 mg/dL — ABNORMAL HIGH (ref 70–99)
Potassium: 4 mmol/L (ref 3.5–5.1)
SODIUM: 139 mmol/L (ref 135–145)

## 2018-07-20 MED ORDER — DOCUSATE SODIUM 100 MG PO CAPS: 100.0000 mg | ORAL_CAPSULE | Freq: Two times a day (BID) | ORAL | 0 refills | Status: DC | PRN

## 2018-07-20 MED ORDER — TORSEMIDE 20 MG PO TABS: 20.0000 mg | ORAL_TABLET | Freq: Every day | ORAL | Status: DC

## 2018-07-20 MED ORDER — TORSEMIDE 20 MG PO TABS: ORAL_TABLET | ORAL | 3 refills | Status: DC

## 2018-07-20 NOTE — Discharge Summary (Signed)
Discharge Summary    Patient ID: Ryan Macdonald,  MRN: [id number], DOB/AGE: [birth year] 84 y.o.  Admit date: 07/18/2018 Discharge date: 07/20/2018  Primary Care Provider: [facility]  Dr Janine Ores, [facility] [phone] Primary Cardiologist: Dr Duke Salvia  Discharge Diagnoses    Active Problems:   Acute on chronic combined systolic and diastolic congestive heart failure (HCC)   Allergies Allergies  Allergen Reactions  . Lisinopril Swelling    Facial swelling  . Xarelto [Rivaroxaban]     bleeding    Diagnostic Studies/Procedures    Dg Chest Port 1 View  Result Date: 07/18/2018 CLINICAL DATA:  Shortness of breath EXAM: PORTABLE CHEST 1 VIEW COMPARISON:  05/02/2018 FINDINGS: Left AICD remains in place, unchanged. Mild cardiomegaly. No confluent airspace opacities or effusions. Mild vascular congestion. No acute bony abnormality. IMPRESSION: Mild cardiomegaly, vascular congestion. Electronically Signed   By: Charlett Nose M.D.   On: 07/18/2018 07:56    _____________   History of Present Illness     84 year old male with past medical history of nonischemic cardiomyopathy, chronic combined systolic/diastolic congestive heart failure, paroxysmal atrial fibrillation, prior ICD, hypertension, hyperlipidemia, chronic stage III kidney disease with acute on chronic combined systolic/diastolic congestive heart failure with admitted 8/20 with acute on chronic CHF.   Hospital Course     Consultants: None  He was diuresed with IV Lasix and transitioned to p.o. Demadex once his volume status improved.  He is encouraged to continue daily weights and low-sodium diet. His weight at d/c is 253.7 pounds.  His blood pressure was soft, Imdur is discontinued for now.  Continue beta-blocker and hydralazine.  No ACE inhibitor or ARB secondary to angioedema with ACE inhibitors.  He has chronic kidney disease, and is to recheck on 8/26.  However, no significant  change in his BUN or creatinine with diuresis.  His troponin was mildly elevated, but he had no chest pain.  No ischemic evaluation is indicated.  He was anemic, but this is felt likely related to renal insufficiency.  On 8/22, he was seen by Dr. Jens Som and all data were reviewed.  No further inpatient work-up is indicated and he is considered stable for discharge, to follow-up as an outpatient. _____________  Discharge Vitals Blood pressure (!) 107/59, pulse 70, temperature 98.3 F (36.8 C), temperature source Oral, resp. rate 20, height 5\' 7"  (1.702 m), weight 115.1 kg, SpO2 96 %.  Filed Weights   07/18/18 1626 07/19/18 0358 07/20/18 0430  Weight: 115.3 kg 115.2 kg 115.1 kg    Labs & Radiologic Studies    CBC Recent Labs    07/18/18 0743  WBC 9.3  HGB 11.4*  HCT 36.9*  MCV 98.1  PLT 248   Basic Metabolic Panel Recent Labs    29/02/11 0427 07/20/18 0500  NA 139 139  K 4.8 4.0  CL 106 104  CO2 26 29  GLUCOSE 126* 148*  BUN 24* 30*  CREATININE 1.60* 1.63*  CALCIUM 8.9 8.8*   Cardiac Enzymes  Recent Labs    07/18/18 1630 07/18/18 2241 07/19/18 0427  TROPONINI 0.10* 0.11* 0.10*    BNP    Component Value Date/Time   BNP 295.5 (H) 07/18/2018 0744  _____________  Dg Chest Port 1 View  Result Date: 07/18/2018 CLINICAL DATA:  Shortness of breath EXAM: PORTABLE CHEST 1 VIEW COMPARISON:  05/02/2018 FINDINGS: Left AICD remains in place, unchanged. Mild cardiomegaly. No confluent airspace opacities or effusions. Mild vascular congestion. No acute bony abnormality.  IMPRESSION: Mild cardiomegaly, vascular congestion. Electronically Signed   By: Charlett Nose M.D.   On: 07/18/2018 07:56   Disposition   Pt is being discharged home today in good condition.  Follow-up Plans & Appointments    Follow-up Information    [facility].   Specialty:  General Practice Why:  Please call for appointment Contact information: [address] [place]  [address] [phone]        [name], MD Follow up on 07/24/2018.   Specialty:  Cardiology Why:  Come by the office any time Monday morning for blood work. Do not have to be fasting, take all usual morning medications.  Contact information: [address] [place] [address] [phone]          Discharge Instructions    Diet - low sodium heart healthy   Complete by:  As directed    Increase activity slowly   Complete by:  As directed       Discharge Medications   Allergies as of 07/20/2018      Reactions   Lisinopril Swelling   Facial swelling   Xarelto [rivaroxaban]    bleeding      Medication List    STOP taking these medications   hydrALAZINE 25 MG tablet Commonly known as:  APRESOLINE   isosorbide dinitrate 40 MG CR capsule Commonly known as:  DILATRATE-SR   MAGNESIUM PO     TAKE these medications   allopurinol 100 MG tablet Commonly known as:  ZYLOPRIM Take 200 mg by mouth daily.   amiodarone 200 MG tablet Commonly known as:  PACERONE Take 1 tablet (200 mg total) by mouth 2 (two) times daily.   apixaban 2.5 MG Tabs tablet Commonly known as:  ELIQUIS Take 1 tablet (2.5 mg total) by mouth 2 (two) times daily.   carvedilol 12.5 MG tablet Commonly known as:  COREG Take 0.5 tablets (6.25 mg total) by mouth 2 (two) times daily with a meal.   cyanocobalamin 1000 MCG tablet Take 1,000 mcg by mouth daily.   diclofenac sodium 1 % Gel Commonly known as:  VOLTAREN Apply 4 g topically 4 (four) times daily.   docusate sodium 100 MG capsule Commonly known as:  COLACE Take 1 capsule (100 mg total) by mouth 2 (two) times daily as needed for mild constipation.   finasteride 5 MG tablet Commonly known as:  PROSCAR Take 5 mg by mouth daily.   fluticasone furoate-vilanterol 100-25 MCG/INH Aepb Commonly known as:  BREO ELLIPTA Inhale 1 puff into the lungs daily.   glipiZIDE 5 MG tablet Commonly known as:  GLUCOTROL Take 5 mg by  mouth daily before breakfast.   HYDROcodone-acetaminophen 5-325 MG tablet Commonly known as:  NORCO/VICODIN Take 2 tablets by mouth every 4 (four) hours as needed. What changed:    how much to take  when to take this  reasons to take this   ipratropium-albuterol 0.5-2.5 (3) MG/3ML Soln Commonly known as:  DUONEB Take 3 mLs by nebulization every 4 (four) hours as needed. What changed:  reasons to take this   ketoconazole 2 % shampoo Commonly known as:  NIZORAL Apply 1 application topically 2 (two) times a week.   rosuvastatin 20 MG tablet Commonly known as:  CRESTOR Take 1 tablet (20 mg total) by mouth daily at 6 PM.   sertraline 25 MG tablet Commonly known as:  ZOLOFT Take 50 mg by mouth 2 (two) times a week.   tamsulosin 0.4 MG  Caps capsule Commonly known as:  FLOMAX Take 0.4 mg by mouth at bedtime.   torsemide 20 MG tablet Commonly known as:  DEMADEX Take one 20mg  tablet daily.  However, if weight up 3lbs in 24 hours take an extra dose that day.  If weight is below your dry weight skip your dose that day.         Outstanding Labs/Studies   None  Duration of Discharge Encounter   Greater than 30 minutes including physician time.  Bobbye Riggs Latona Krichbaum NP 07/20/2018, 2:58 PM

## 2018-07-20 NOTE — Progress Notes (Signed)
Pt and wife inquiring about discharge, paged PA on call to advise

## 2018-07-20 NOTE — Progress Notes (Signed)
Pt and wife given AVS, reviewed all meds and reinforced his meds to stop due to soft bp , pt also thought he was to take torsemide as needed per wt. Pt was instructed and reinforced on daily daily with as needed if wt gain 3lbs in 24 hours and to hold only for that day if he is at dry weight 254lbs this am, pt verbvalized understanding, dc home via wheelchair in stable condition, pt was given written rx

## 2018-07-20 NOTE — Progress Notes (Signed)
Progress Note  Patient Name: Ryan Macdonald Date of Encounter: 07/20/2018  Primary Cardiologist: [name], MD   Subjective   No CP or dyspnea  Inpatient Medications    Scheduled Meds: . allopurinol  200 mg Oral Daily  . amiodarone  200 mg Oral BID  . apixaban  2.5 mg Oral BID  . carvedilol  6.25 mg Oral BID WC  . finasteride  5 mg Oral Daily  . furosemide  40 mg Intravenous BID  . glipiZIDE  5 mg Oral QAC breakfast  . hydrALAZINE  12.5 mg Oral Q8H  . isosorbide dinitrate  40 mg Oral TID  . rosuvastatin  20 mg Oral q1800  . sertraline  50 mg Oral Once per day on Mon Thu  . sodium chloride flush  3 mL Intravenous Q12H  . tamsulosin  0.4 mg Oral QHS   Continuous Infusions: . sodium chloride     PRN Meds: sodium chloride, acetaminophen, docusate sodium, HYDROcodone-acetaminophen, sodium chloride flush   Vital Signs    Vitals:   07/19/18 2258 07/20/18 0055 07/20/18 0430 07/20/18 0600  BP: 118/74 (!) 100/55 (!) 90/45 105/63  Pulse: 73 68 70   Resp:  17 16   Temp:  97.8 F (36.6 C) 97.8 F (36.6 C)   TempSrc:  Oral Oral   SpO2:  99% 99%   Weight:   115.1 kg   Height:        Intake/Output Summary (Last 24 hours) at 07/20/2018 0825 Last data filed at 07/20/2018 7322 Gross per 24 hour  Intake 733 ml  Output 2550 ml  Net -1817 ml   Filed Weights   07/18/18 1626 07/19/18 0358 07/20/18 0430  Weight: 115.3 kg 115.2 kg 115.1 kg    Telemetry    Sinus with occasional Vpacing- Personally Reviewed  Physical Exam   GEN: WD obese No acute distress.   Neck: no JVD Cardiac: RRR Respiratory: Clear to auscultation bilaterally; no wheeze. GI: Soft, nontender, non-distended, no masses MS: No edema Neuro:  Grossly intact  Labs    Chemistry Recent Labs  Lab 07/18/18 0832 07/19/18 0427 07/20/18 0500  NA 141 139 139  K 4.6 4.8 4.0  CL 106 106 104  CO2 24 26 29   GLUCOSE 114* 126* 148*  BUN 20 24* 30*  CREATININE 1.46* 1.60* 1.63*  CALCIUM 9.1 8.9  8.8*  GFRNONAA 43* 38* 38*  GFRAA 50* 45* 44*  ANIONGAP 11 7 6      Hematology Recent Labs  Lab 07/18/18 0743  WBC 9.3  RBC 3.76*  HGB 11.4*  HCT 36.9*  MCV 98.1  MCH 30.3  MCHC 30.9  RDW 14.8  PLT 248    Cardiac Enzymes Recent Labs  Lab 07/18/18 1025 07/18/18 1630 07/18/18 2241 07/19/18 0427  TROPONINI 0.09* 0.10* 0.11* 0.10*    Recent Labs  Lab 07/18/18 0745  TROPIPOC 0.07     BNP Recent Labs  Lab 07/18/18 0744  BNP 295.5*       Patient Profile     84 year old male with past medical history of nonischemic cardiomyopathy, chronic combined systolic/diastolic congestive heart failure, paroxysmal atrial fibrillation, prior ICD, hypertension, hyperlipidemia, chronic stage III kidney disease with acute on chronic combined systolic/diastolic congestive heart failure.    Assessment & Plan    1 acute on chronic combined systolic/diastolic congestive heart failure-I/O - 4395.  Patient symptoms have improved.  Discontinue IV Lasix.  Treat with Demadex 20 mg daily.  He will weigh daily and take an  additional 20 mg for weight gain of 2 to 3 pounds.  Continue fluid restriction, low-sodium diet.    2 paroxysmal atrial fibrillation-patient remains in sinus rhythm.  Continue amiodarone. CHADSvasc 5.  Continue apixaban.  3 nonischemic cardiomyopathy-continue beta-blocker and hydralazine.  Blood pressure borderline.  Discontinue nitrates for now.  Can resume as an outpatient if blood pressure improves. Patient has a history of angioedema to ACE inhibitors.  4 hypertension-blood pressure is borderline.  Discontinue nitrates as outlined above.  5 chronic stage III kidney disease-recheck potassium and renal function on Monday, August 26.  6 minimally elevated troponin-no chest pain.  No plans for further ischemia evaluation.  7 normocytic anemia-likely related to renal insufficiency.  Discharge today and schedule transition of care appointment in 1 week.  He will be  followed at the [place] as well.  Follow-up Dr. Duke Salvia 3 months.  As outlined above check potassium and renal function on Monday, August 26.  Greater than 30 minutes PA and physician time. D2  For questions or updates, please contact [facility] Please consult [email or web address] for contact info under Cardiology/STEMI.      Signed, Olga Millers, MD  07/20/2018, 8:25 AM

## 2018-07-24 ENCOUNTER — Other Ambulatory Visit: Payer: Self-pay

## 2018-07-24 DIAGNOSIS — I10 Essential (primary) hypertension: Secondary | ICD-10-CM

## 2018-07-25 LAB — BASIC METABOLIC PANEL
BUN/Creatinine Ratio: 16 (ref 10–24)
BUN: 29 mg/dL — ABNORMAL HIGH (ref 8–27)
CHLORIDE: 98 mmol/L (ref 96–106)
CO2: 26 mmol/L (ref 20–29)
Calcium: 9.5 mg/dL (ref 8.6–10.2)
Creatinine, Ser: 1.8 mg/dL — ABNORMAL HIGH (ref 0.76–1.27)
GFR, EST AFRICAN AMERICAN: 40 mL/min/{1.73_m2} — AB (ref >59)
GFR, EST NON AFRICAN AMERICAN: 34 mL/min/{1.73_m2} — AB (ref >59)
Glucose: 116 mg/dL — ABNORMAL HIGH (ref 65–99)
POTASSIUM: 4.6 mmol/L (ref 3.5–5.2)
SODIUM: 141 mmol/L (ref 134–144)

## 2018-07-27 ENCOUNTER — Telehealth: Payer: Self-pay | Admitting: *Deleted

## 2018-07-27 DIAGNOSIS — N289 Disorder of kidney and ureter, unspecified: Secondary | ICD-10-CM

## 2018-07-27 NOTE — Telephone Encounter (Signed)
Follow up ° °Pt returning call for nurse °

## 2018-07-27 NOTE — Telephone Encounter (Signed)
Left message to call back  

## 2018-07-27 NOTE — Telephone Encounter (Signed)
-----   Message from [name], MD sent at 07/27/2018  6:09 AM EDT ----- Kidney function is worsening which may mean he is getting too dry.  Hold torsemide for 1 day and repeat BMP in 1 week.

## 2018-07-27 NOTE — Telephone Encounter (Signed)
Advised patient, verbalized understanding. Will get labs next week Orders in Epic

## 2018-07-27 NOTE — Telephone Encounter (Signed)
-----   Message from Tiffany Metzger, MD sent at 07/27/2018  6:09 AM EDT ----- Kidney function is worsening which may mean he is getting too dry.  Hold torsemide for 1 day and repeat BMP in 1 week. 

## 2018-07-28 ENCOUNTER — Emergency Department (HOSPITAL_COMMUNITY)
Admission: EM | Admit: 2018-07-28 | Discharge: 2018-07-28 | Disposition: A | Discharge status: Home / Self Care | Payer: Non-veteran care | Attending: Emergency Medicine | Admitting: Emergency Medicine

## 2018-07-28 ENCOUNTER — Encounter (HOSPITAL_COMMUNITY): Payer: Self-pay

## 2018-07-28 ENCOUNTER — Emergency Department (HOSPITAL_COMMUNITY): Payer: Non-veteran care

## 2018-07-28 DIAGNOSIS — I5023 Acute on chronic systolic (congestive) heart failure: Secondary | ICD-10-CM | POA: Diagnosis not present

## 2018-07-28 DIAGNOSIS — Z7901 Long term (current) use of anticoagulants: Secondary | ICD-10-CM | POA: Insufficient documentation

## 2018-07-28 DIAGNOSIS — Z7984 Long term (current) use of oral hypoglycemic drugs: Secondary | ICD-10-CM | POA: Diagnosis not present

## 2018-07-28 DIAGNOSIS — N183 Chronic kidney disease, stage 3 (moderate): Secondary | ICD-10-CM | POA: Diagnosis not present

## 2018-07-28 DIAGNOSIS — Z87891 Personal history of nicotine dependence: Secondary | ICD-10-CM | POA: Insufficient documentation

## 2018-07-28 DIAGNOSIS — I130 Hypertensive heart and chronic kidney disease with heart failure and stage 1 through stage 4 chronic kidney disease, or unspecified chronic kidney disease: Secondary | ICD-10-CM | POA: Insufficient documentation

## 2018-07-28 DIAGNOSIS — E1122 Type 2 diabetes mellitus with diabetic chronic kidney disease: Secondary | ICD-10-CM | POA: Diagnosis not present

## 2018-07-28 DIAGNOSIS — Z79899 Other long term (current) drug therapy: Secondary | ICD-10-CM | POA: Insufficient documentation

## 2018-07-28 DIAGNOSIS — R0603 Acute respiratory distress: Secondary | ICD-10-CM | POA: Diagnosis present

## 2018-07-28 LAB — CBC
HCT: 38 % — ABNORMAL LOW (ref 39.0–52.0)
Hemoglobin: 12.1 g/dL — ABNORMAL LOW (ref 13.0–17.0)
MCH: 30.9 pg (ref 26.0–34.0)
MCHC: 31.8 g/dL (ref 30.0–36.0)
MCV: 96.9 fL (ref 78.0–100.0)
PLATELETS: 245 10*3/uL (ref 150–400)
RBC: 3.92 MIL/uL — AB (ref 4.22–5.81)
RDW: 14.6 % (ref 11.5–15.5)
WBC: 9 10*3/uL (ref 4.0–10.5)

## 2018-07-28 LAB — BASIC METABOLIC PANEL
Anion gap: 9 (ref 5–15)
BUN: 29 mg/dL — AB (ref 8–23)
CO2: 25 mmol/L (ref 22–32)
Calcium: 9.2 mg/dL (ref 8.9–10.3)
Chloride: 103 mmol/L (ref 98–111)
Creatinine, Ser: 1.7 mg/dL — ABNORMAL HIGH (ref 0.61–1.24)
GFR, EST AFRICAN AMERICAN: 41 mL/min — AB (ref >60)
GFR, EST NON AFRICAN AMERICAN: 36 mL/min — AB (ref >60)
Glucose, Bld: 197 mg/dL — ABNORMAL HIGH (ref 70–99)
POTASSIUM: 4.5 mmol/L (ref 3.5–5.1)
SODIUM: 137 mmol/L (ref 135–145)

## 2018-07-28 LAB — I-STAT TROPONIN, ED: Troponin i, poc: 0.03 ng/mL (ref 0.00–0.08)

## 2018-07-28 LAB — BRAIN NATRIURETIC PEPTIDE: B NATRIURETIC PEPTIDE 5: 683.1 pg/mL — AB (ref 0.0–100.0)

## 2018-07-28 MED ORDER — FUROSEMIDE 10 MG/ML IJ SOLN
80.0000 mg | Freq: Once | INTRAMUSCULAR | Status: AC
  Administered 2018-07-28: 80 mg via INTRAVENOUS
  Filled 2018-07-28: qty 8

## 2018-07-28 NOTE — Progress Notes (Signed)
Patient appeared to be breathing better at this time. Removed patient from bipap and placed him on 2lpm nasal cannula. Will continue to monitor patient.

## 2018-07-28 NOTE — Progress Notes (Signed)
Patient arrived on CPAP via EMS with c/o being short of breath. Placed patient on BIPAP with IPAP set at 16cm and EPAP set at 8cm. Will continue to monitor patient.

## 2018-07-28 NOTE — ED Notes (Signed)
Pt ambulated in hallway, O2 stat remained above 92% on RA

## 2018-07-28 NOTE — ED Triage Notes (Signed)
Pt comes via [facility] for resp distress that started 3 hours ago, hx of CHF, rales all fields, stats in the low 90's, on CPAP, given 1 nitro PTA, initial pressure 220 systolic

## 2018-07-28 NOTE — ED Provider Notes (Signed)
[name] EMERGENCY DEPARTMENT Provider Note   CSN: [id number] Arrival date & time: 07/28/18  4098     History   Chief Complaint Chief Complaint  Patient presents with  . Respiratory Distress   Level 5 caveat due to acuity of condition HPI Ryan Macdonald is a 84 y.o. male.  The history is provided by the patient.  Shortness of Breath  This is a new problem. The problem occurs frequently.The problem has been rapidly worsening. Pertinent negatives include no chest pain. Associated medical issues include heart failure.  Patient presents for acute shortness of breath.  He reports just prior to arrival he began feeling very short of breath.  EMS was called he was noted to have crackles bilaterally and was hypoxic.  He was placed on CPAP with improvement.  He is also given nitroglycerin.  No other details known on arrival.  Past Medical History:  Diagnosis Date  . Atrial fibrillation (HCC)   . Cardiomyopathy (HCC)   . Cat scratch fever    removed mass on right side neck  . Cataracts, bilateral   . CHF (congestive heart failure) (HCC)   . Chronic kidney disease   . Corns and callosities   . Diabetes mellitus without complication (HCC)    TYPE 2  . Dyspnea   . Erythema intertrigo   . Flat foot   . Gout   . High cholesterol   . Hx of urethral stricture   . Hypertension   . Kidney stones   . Morbid obesity (HCC)   . Nonischemic cardiomyopathy (HCC)    a. ? 2009 Cath in [place] - nl cors per pt;  b. 09/2013 Echo: EF 25-30%, sev diff HK.  . Obesity   . Osteoarthritis   . PAF (paroxysmal atrial fibrillation) (HCC)    a. post-op hip in 2014 - prev on xarelto.  . Renal insufficiency     Patient Active Problem List   Diagnosis Date Noted  . Acute on chronic combined systolic and diastolic congestive heart failure (HCC) 07/18/2018  . First degree AV block   . Shortness of breath 04/29/2018  . Acute HFrEF (heart failure with reduced ejection fraction) (HCC)  04/29/2018  . Acute respiratory failure with hypoxia (HCC) 09/27/2017  . CHF exacerbation (HCC) 09/27/2017  . Renal insufficiency   . Acute on chronic systolic CHF (congestive heart failure) (HCC) 07/08/2017  . Left knee pain 07/08/2017  . Acute bronchitis 05/10/2017  . Acute congestive heart failure (HCC) 04/14/2017  . Elevated troponin 04/14/2017  . Lactic acidosis   . Acute on chronic systolic heart failure, NYHA class 2 (HCC) 02/03/2017  . Diabetes mellitus type 2 in obese (HCC) 02/03/2017  . CAP (community acquired pneumonia) 02/03/2017  . Lobar pneumonia (HCC)   . Diabetes mellitus with complication (HCC)   . Acute on chronic respiratory failure with hypoxia (HCC)   . Acute on chronic combined systolic and diastolic heart failure (HCC) 05/19/2016  . Depression 05/19/2016  . Gout 05/19/2016  . 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 NSVT (nonsustained ventricular tachycardia) (HCC)   . Chronic renal insufficiency, stage 3 (moderate) (HCC)   . Pulmonary embolism (HCC)   . Acute respiratory failure (HCC)   . Osteoarthritis of left hip 10/25/2013  . Atrial fibrillation (HCC) 10/25/2013  . S/P hip replacement 10/24/2013  . Nonischemic cardiomyopathy (HCC)   . Obesity (BMI 30-39.9)   . Essential hypertension   . Hyperlipidemia     Past Surgical History:  Procedure Laterality Date  . CARDIAC CATHETERIZATION  1980's  . CIRCUMCISION  1940's  . CYSTOSCOPY WITH URETHRAL DILATATION  10/23/2013  . CYSTOSCOPY WITH URETHRAL DILATATION N/A 10/23/2013   Procedure: CYSTOSCOPY WITH URETHRAL DILATATION;  Surgeon: Kathi Ludwig, MD;  Location: [facility] OR;  Service: Urology;  Laterality: N/A;  . INCISION AND DRAINAGE OF WOUND Right 1980's   "cat scratch" (10/23/2013)  . INGUINAL HERNIA REPAIR Right 1950's  . KIDNEY STONE SURGERY Right 1970's; 1983   "twice"  . TONSILLECTOMY AND ADENOIDECTOMY  1940's  . TOTAL HIP ARTHROPLASTY Left 10/23/2013   Procedure:  TOTAL HIP ARTHROPLASTY;  Surgeon: Valeria Batman, MD;  Location: [facility] OR;  Service: Orthopedics;  Laterality: Left;        Home Medications    Prior to Admission medications   Medication Sig Start Date End Date Taking? Authorizing Provider  allopurinol (ZYLOPRIM) 100 MG tablet Take 200 mg by mouth daily.    [provider]  amiodarone (PACERONE) 200 MG tablet Take 1 tablet (200 mg total) by mouth 2 (two) times daily. 02/16/18   Santos-Sanchez, Chelsea Primus, MD  apixaban (ELIQUIS) 2.5 MG TABS tablet Take 1 tablet (2.5 mg total) by mouth 2 (two) times daily. 05/04/18   Angelita Ingles, MD  carvedilol (COREG) 12.5 MG tablet Take 0.5 tablets (6.25 mg total) by mouth 2 (two) times daily with a meal. 04/15/17   Osvaldo Shipper, MD  cyanocobalamin 1000 MCG tablet Take 1,000 mcg by mouth daily.     [provider]  diclofenac sodium (VOLTAREN) 1 % GEL Apply 4 g topically 4 (four) times daily. 07/10/17   Valentino Nose, MD  docusate sodium (COLACE) 100 MG capsule Take 1 capsule (100 mg total) by mouth 2 (two) times daily as needed for mild constipation. 07/20/18   Barrett, Joline Salt, PA-C  finasteride (PROSCAR) 5 MG tablet Take 5 mg by mouth daily.    [provider]  fluticasone furoate-vilanterol (BREO ELLIPTA) 100-25 MCG/INH AEPB Inhale 1 puff into the lungs daily. 05/05/18   Angelita Ingles, MD  glipiZIDE (GLUCOTROL) 5 MG tablet Take 5 mg by mouth daily before breakfast.    [provider]  HYDROcodone-acetaminophen (NORCO/VICODIN) 5-325 MG tablet Take 2 tablets by mouth every 4 (four) hours as needed. Patient taking differently: Take 1 tablet by mouth 2 (two) times daily as needed for moderate pain.  04/09/16   Isa Rankin, MD  ipratropium-albuterol (DUONEB) 0.5-2.5 (3) MG/3ML SOLN Take 3 mLs by nebulization every 4 (four) hours as needed. Patient taking differently: Take 3 mLs by nebulization every 4 (four) hours as needed (shortness of breath).  05/14/17   Pearson Grippe,  MD  ketoconazole (NIZORAL) 2 % shampoo Apply 1 application topically 2 (two) times a week.    [provider]  rosuvastatin (CRESTOR) 20 MG tablet Take 1 tablet (20 mg total) by mouth daily at 6 PM. 05/04/18   Angelita Ingles, MD  sertraline (ZOLOFT) 25 MG tablet Take 50 mg by mouth 2 (two) times a week.     [provider]  tamsulosin (FLOMAX) 0.4 MG CAPS capsule Take 0.4 mg by mouth at bedtime.    [provider]  torsemide (DEMADEX) 20 MG tablet Take one 20mg  tablet daily.  However, if weight up 3lbs in 24 hours take an extra dose that day.  If weight is below your dry weight skip your dose that day. 07/20/18   Barrett, Joline Salt, PA-C    Family History Family  History  Problem Relation Age of Onset  . Heart disease Mother   . Heart Problems Maternal Grandmother   . Other Unknown        negative for premature CAD    Social History Social History   Tobacco Use  . Smoking status: Former Smoker    Packs/day: 1.00    Years: 15.00    Pack years: 15.00    Types: Cigarettes, Cigars  . Smokeless tobacco: Former Neurosurgeon  . Tobacco comment: quit smoking ~ 50 yr ago  Substance Use Topics  . Alcohol use: No    Alcohol/week: 2.0 standard drinks    Types: 2 Cans of beer per week    Comment: rare beer.  . Drug use: No     Allergies   Lisinopril and Xarelto [rivaroxaban]   Review of Systems Review of Systems  Unable to perform ROS: Acuity of condition  Respiratory: Positive for shortness of breath.   Cardiovascular: Negative for chest pain.     Physical Exam Updated Vital Signs BP 133/81   Pulse 70   Resp (!) 21   SpO2 100%   Physical Exam  CONSTITUTIONAL: Elderly, mild distress noted HEAD: Normocephalic/atraumatic EYES: EOMI/PERRL ENMT: Mucous membranes moist, CPAP mask in place NECK: supple no meningeal signs SPINE/BACK:entire spine nontender CV: S1/S2 noted, no murmurs/rubs/gallops noted LUNGS: Crackles bilaterally ABDOMEN: soft,  nontender, no rebound or guarding, bowel sounds noted throughout abdomen GU:no cva tenderness NEURO: Pt is awake/alert/appropriate, moves all extremitiesx4.  No facial droop.   EXTREMITIES: pulses normal/equal, full ROM SKIN: warm, color normal PSYCH: Mildly anxious  ED Treatments / Results  Labs (all labs ordered are listed, but only abnormal results are displayed) Labs Reviewed  CBC - Abnormal; Notable for the following components:      Result Value   RBC 3.92 (*)    Hemoglobin 12.1 (*)    HCT 38.0 (*)    All other components within normal limits  BASIC METABOLIC PANEL - Abnormal; Notable for the following components:   Glucose, Bld 197 (*)    BUN 29 (*)    Creatinine, Ser 1.70 (*)    GFR calc non Af Amer 36 (*)    GFR calc Af Amer 41 (*)    All other components within normal limits  BRAIN NATRIURETIC PEPTIDE - Abnormal; Notable for the following components:   B Natriuretic Peptide 683.1 (*)    All other components within normal limits  I-STAT TROPONIN, ED    EKG EKG Interpretation  Date/Time:  Friday July 28 2018 03:53:21 EDT Ventricular Rate:  70 PR Interval:    QRS Duration: 188 QT Interval:  508 QTC Calculation: 548 R Axis:   -60 Text Interpretation:  Ventricular-paced rhythm Abnormal ECG Confirmed by Zadie Rhine ([id number]) on 07/28/2018 4:35:22 AM   Radiology Dg Chest Portable 1 View  Result Date: 07/28/2018 CLINICAL DATA:  84 y/o  M; respiratory distress 3 hours ago. EXAM: PORTABLE CHEST 1 VIEW COMPARISON:  07/18/2018 chest radiograph FINDINGS: Stable cardiomegaly given projection and technique. Single lead AICD. Aortic atherosclerosis with calcification. Diffuse reticular and hazy opacities of the lungs. No pleural effusion or pneumothorax. No acute osseous abnormality is evident. IMPRESSION: Stable mild cardiomegaly.  Interstitial pulmonary edema. Electronically Signed   By: Mitzi Hansen M.D.   On: 07/28/2018 04:13    Procedures Procedures    CRITICAL CARE Performed by: Joya Gaskins Total critical care time: 31 minutes Critical care time was exclusive of separately billable procedures and treating other  patients. Critical care was necessary to treat or prevent imminent or life-threatening deterioration. Critical care was time spent personally by me on the following activities: development of treatment plan with patient and/or surrogate as well as nursing, discussions with consultants, evaluation of patient's response to treatment, examination of patient, obtaining history from patient or surrogate, ordering and performing treatments and interventions, ordering and review of laboratory studies, ordering and review of radiographic studies, pulse oximetry and re-evaluation of patient's condition. Patient arrived on CPAP with concerns for acute CHF.  Medications Ordered in ED Medications  furosemide (LASIX) injection 80 mg (80 mg Intravenous Given 07/28/18 0452)     Initial Impression / Assessment and Plan / ED Course  I have reviewed the triage vital signs and the nursing notes.  Pertinent labs & imaging results that were available during my care of the patient were reviewed by me and considered in my medical decision making (see chart for details).     Patient seen on arrival for acute shortness of breath, probable pulmonary edema.  He was transitioned to BiPAP with good improvement.  X-ray labs reviewed.  He was just recently admitted for these issues.  He already received nitro glycerin, he was also given Lasix in the emergency department  Denies chest pain, denies ICD shocks 7:31 AM He has been monitored for several hours with improvement.  He is afebrile.  His blood pressure has normalized.  There is no hypoxia on room air.  When he ambulated his O2 sats stayed in the 90s Reports feeling at baseline But he recently skipped his torsemide.  I encouraged him to restart this. I gave the patient the option of observation  versus discharge home.  After aggressive diuresis in the emergency department he feels much improved.  He would prefer to go home since he was just in the hospital.  He has follow-up on September 10 with his cardiologist. No signs of PE/ACS. Patient feels comfortable for discharge.  We discussed strict return precautions Final Clinical Impressions(s) / ED Diagnoses   Final diagnoses:  Acute on chronic systolic congestive heart failure [facility])    ED Discharge Orders    None       Zadie Rhine, MD 07/28/18 [phone]

## 2018-07-31 ENCOUNTER — Emergency Department (HOSPITAL_COMMUNITY): Payer: Non-veteran care

## 2018-07-31 ENCOUNTER — Other Ambulatory Visit: Payer: Self-pay

## 2018-07-31 ENCOUNTER — Emergency Department (HOSPITAL_COMMUNITY)
Admission: EM | Admit: 2018-07-31 | Discharge: 2018-08-01 | Disposition: A | Discharge status: Home / Self Care | Payer: Non-veteran care | Attending: Emergency Medicine | Admitting: Emergency Medicine

## 2018-07-31 ENCOUNTER — Encounter (HOSPITAL_COMMUNITY): Payer: Self-pay | Admitting: Emergency Medicine

## 2018-07-31 DIAGNOSIS — I130 Hypertensive heart and chronic kidney disease with heart failure and stage 1 through stage 4 chronic kidney disease, or unspecified chronic kidney disease: Secondary | ICD-10-CM | POA: Insufficient documentation

## 2018-07-31 DIAGNOSIS — Z96642 Presence of left artificial hip joint: Secondary | ICD-10-CM | POA: Insufficient documentation

## 2018-07-31 DIAGNOSIS — Z87891 Personal history of nicotine dependence: Secondary | ICD-10-CM | POA: Insufficient documentation

## 2018-07-31 DIAGNOSIS — R0602 Shortness of breath: Secondary | ICD-10-CM | POA: Diagnosis present

## 2018-07-31 DIAGNOSIS — N183 Chronic kidney disease, stage 3 (moderate): Secondary | ICD-10-CM | POA: Insufficient documentation

## 2018-07-31 DIAGNOSIS — Z79899 Other long term (current) drug therapy: Secondary | ICD-10-CM | POA: Insufficient documentation

## 2018-07-31 DIAGNOSIS — E119 Type 2 diabetes mellitus without complications: Secondary | ICD-10-CM | POA: Diagnosis not present

## 2018-07-31 DIAGNOSIS — I5023 Acute on chronic systolic (congestive) heart failure: Secondary | ICD-10-CM | POA: Diagnosis not present

## 2018-07-31 LAB — CBC WITH DIFFERENTIAL/PLATELET
Abs Immature Granulocytes: 0 10*3/uL (ref 0.0–0.1)
BASOS ABS: 0 10*3/uL (ref 0.0–0.1)
Basophils Relative: 1 %
Eosinophils Absolute: 0.2 10*3/uL (ref 0.0–0.7)
Eosinophils Relative: 2 %
HCT: 35.9 % — ABNORMAL LOW (ref 39.0–52.0)
HEMOGLOBIN: 11.2 g/dL — AB (ref 13.0–17.0)
Immature Granulocytes: 0 %
LYMPHS ABS: 1.3 10*3/uL (ref 0.7–4.0)
LYMPHS PCT: 17 %
MCH: 30.7 pg (ref 26.0–34.0)
MCHC: 31.2 g/dL (ref 30.0–36.0)
MCV: 98.4 fL (ref 78.0–100.0)
MONO ABS: 0.7 10*3/uL (ref 0.1–1.0)
MONOS PCT: 9 %
NEUTROS ABS: 5.5 10*3/uL (ref 1.7–7.7)
Neutrophils Relative %: 71 %
Platelets: 215 10*3/uL (ref 150–400)
RBC: 3.65 MIL/uL — AB (ref 4.22–5.81)
RDW: 14.6 % (ref 11.5–15.5)
WBC: 7.7 10*3/uL (ref 4.0–10.5)

## 2018-07-31 LAB — I-STAT TROPONIN, ED: TROPONIN I, POC: 0.07 ng/mL (ref 0.00–0.08)

## 2018-07-31 MED ORDER — BENZONATATE 100 MG PO CAPS
100.0000 mg | ORAL_CAPSULE | Freq: Once | ORAL | Status: AC
  Administered 2018-08-01: 100 mg via ORAL
  Filled 2018-07-31: qty 1

## 2018-07-31 NOTE — ED Triage Notes (Signed)
Pt to ED via [facility] with c/o shortness of breath, onset approx 2-3 hours ago.  Pt denies any chest pain or other complaints

## 2018-07-31 NOTE — ED Provider Notes (Signed)
[name] EMERGENCY DEPARTMENT Provider Note   CSN: [id number] Arrival date & time: 07/31/18  2232     History   Chief Complaint Chief Complaint  Patient presents with  . Shortness of Breath    HPI Kinkade Jaco is a 84 y.o. male.  HPI  This is an 84 year old male with a history of atrial fibrillation, cardiomyopathy, systolic heart failure, hypertension who presents with shortness of breath.  Patient reports 3 to 4-hour history of shortness of breath.  He states he has had a cough that has been productive at times with some clear sputum.  He denies any fevers or infectious symptoms.  He denies any changes in weight or lower extremity swelling.  No recent changes in medication and he reports compliance with medications.  He is unable to tell me whether shortness of breath is worse laying flat or sitting up.  Denies any asymmetric lower extremity swelling or history of blood clots.  He states he feels much better after given oxygen.  He does not wear oxygen at home.  Denies chest pain.  Past Medical History:  Diagnosis Date  . Atrial fibrillation (HCC)   . Cardiomyopathy (HCC)   . Cat scratch fever    removed mass on right side neck  . Cataracts, bilateral   . CHF (congestive heart failure) (HCC)   . Chronic kidney disease   . Corns and callosities   . Diabetes mellitus without complication (HCC)    TYPE 2  . Dyspnea   . Erythema intertrigo   . Flat foot   . Gout   . High cholesterol   . Hx of urethral stricture   . Hypertension   . Kidney stones   . Morbid obesity (HCC)   . Nonischemic cardiomyopathy (HCC)    a. ? 2009 Cath in [place] - nl cors per pt;  b. 09/2013 Echo: EF 25-30%, sev diff HK.  . Obesity   . Osteoarthritis   . PAF (paroxysmal atrial fibrillation) (HCC)    a. post-op hip in 2014 - prev on xarelto.  . Renal insufficiency     Patient Active Problem List   Diagnosis Date Noted  . Acute on chronic combined systolic and diastolic  congestive heart failure (HCC) 07/18/2018  . First degree AV block   . Shortness of breath 04/29/2018  . Acute HFrEF (heart failure with reduced ejection fraction) (HCC) 04/29/2018  . Acute respiratory failure with hypoxia (HCC) 09/27/2017  . CHF exacerbation (HCC) 09/27/2017  . Renal insufficiency   . Acute on chronic systolic CHF (congestive heart failure) (HCC) 07/08/2017  . Left knee pain 07/08/2017  . Acute bronchitis 05/10/2017  . Acute congestive heart failure (HCC) 04/14/2017  . Elevated troponin 04/14/2017  . Lactic acidosis   . Acute on chronic systolic heart failure, NYHA class 2 (HCC) 02/03/2017  . Diabetes mellitus type 2 in obese (HCC) 02/03/2017  . CAP (community acquired pneumonia) 02/03/2017  . Lobar pneumonia (HCC)   . Diabetes mellitus with complication (HCC)   . Acute on chronic respiratory failure with hypoxia (HCC)   . Acute on chronic combined systolic and diastolic heart failure (HCC) 05/19/2016  . Depression 05/19/2016  . Gout 05/19/2016  . 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 NSVT (nonsustained ventricular tachycardia) (HCC)   . Chronic renal insufficiency, stage 3 (moderate) (HCC)   . Pulmonary embolism (HCC)   . Acute respiratory failure (HCC)   . Osteoarthritis of left hip 10/25/2013  . Atrial fibrillation (  HCC) 10/25/2013  . S/P hip replacement 10/24/2013  . Nonischemic cardiomyopathy (HCC)   . Obesity (BMI 30-39.9)   . Essential hypertension   . Hyperlipidemia     Past Surgical History:  Procedure Laterality Date  . CARDIAC CATHETERIZATION  1980's  . CIRCUMCISION  1940's  . CYSTOSCOPY WITH URETHRAL DILATATION  10/23/2013  . CYSTOSCOPY WITH URETHRAL DILATATION N/A 10/23/2013   Procedure: CYSTOSCOPY WITH URETHRAL DILATATION;  Surgeon: Kathi Ludwig, MD;  Location: [facility] OR;  Service: Urology;  Laterality: N/A;  . INCISION AND DRAINAGE OF WOUND Right 1980's   "cat scratch" (10/23/2013)  . INGUINAL HERNIA  REPAIR Right 1950's  . KIDNEY STONE SURGERY Right 1970's; 1983   "twice"  . TONSILLECTOMY AND ADENOIDECTOMY  1940's  . TOTAL HIP ARTHROPLASTY Left 10/23/2013   Procedure: TOTAL HIP ARTHROPLASTY;  Surgeon: Valeria Batman, MD;  Location: [facility] OR;  Service: Orthopedics;  Laterality: Left;        Home Medications    Prior to Admission medications   Medication Sig Start Date End Date Taking? Authorizing Provider  allopurinol (ZYLOPRIM) 100 MG tablet Take 200 mg by mouth daily.    [provider]  amiodarone (PACERONE) 200 MG tablet Take 1 tablet (200 mg total) by mouth 2 (two) times daily. 02/16/18   Santos-Sanchez, Chelsea Primus, MD  apixaban (ELIQUIS) 2.5 MG TABS tablet Take 1 tablet (2.5 mg total) by mouth 2 (two) times daily. 05/04/18   Angelita Ingles, MD  carvedilol (COREG) 12.5 MG tablet Take 0.5 tablets (6.25 mg total) by mouth 2 (two) times daily with a meal. 04/15/17   Osvaldo Shipper, MD  cyanocobalamin 1000 MCG tablet Take 1,000 mcg by mouth daily.     [provider]  diclofenac sodium (VOLTAREN) 1 % GEL Apply 4 g topically 4 (four) times daily. Patient taking differently: Apply 4 g topically 4 (four) times daily as needed (pain).  07/10/17   Valentino Nose, MD  docusate sodium (COLACE) 100 MG capsule Take 1 capsule (100 mg total) by mouth 2 (two) times daily as needed for mild constipation. 07/20/18   Barrett, Joline Salt, PA-C  finasteride (PROSCAR) 5 MG tablet Take 5 mg by mouth daily.    [provider]  fluticasone furoate-vilanterol (BREO ELLIPTA) 100-25 MCG/INH AEPB Inhale 1 puff into the lungs daily. 05/05/18   Angelita Ingles, MD  glipiZIDE (GLUCOTROL) 5 MG tablet Take 5 mg by mouth daily before breakfast.    [provider]  HYDROcodone-acetaminophen (NORCO/VICODIN) 5-325 MG tablet Take 2 tablets by mouth every 4 (four) hours as needed. Patient taking differently: Take 1 tablet by mouth 2 (two) times daily as needed for moderate pain.  04/09/16    Isa Rankin, MD  HYDROXYZINE HCL PO Take 1 tablet by mouth at bedtime as needed (sleep).    [provider]  ipratropium-albuterol (DUONEB) 0.5-2.5 (3) MG/3ML SOLN Take 3 mLs by nebulization every 4 (four) hours as needed. Patient taking differently: Take 3 mLs by nebulization every 4 (four) hours as needed (shortness of breath).  05/14/17   Pearson Grippe, MD  ketoconazole (NIZORAL) 2 % shampoo Apply 1 application topically 2 (two) times a week.    [provider]  PRESCRIPTION MEDICATION Apply 1 application topically daily as needed (pain). ThermaGel    [provider]  rosuvastatin (CRESTOR) 20 MG tablet Take 1 tablet (20 mg total) by mouth daily at 6 PM. 05/04/18   Angelita Ingles, MD  sertraline (ZOLOFT) 25 MG tablet  Take 25-50 mg by mouth daily as needed (for anxiety).     [provider]  tamsulosin (FLOMAX) 0.4 MG CAPS capsule Take 0.4 mg by mouth at bedtime.    [provider]  torsemide (DEMADEX) 20 MG tablet Take one 20mg  tablet daily.  However, if weight up 3lbs in 24 hours take an extra dose that day.  If weight is below your dry weight skip your dose that day. 07/20/18   Barrett, Joline Salt, PA-C    Family History Family History  Problem Relation Age of Onset  . Heart disease Mother   . Heart Problems Maternal Grandmother   . Other Unknown        negative for premature CAD    Social History Social History   Tobacco Use  . Smoking status: Former Smoker    Packs/day: 1.00    Years: 15.00    Pack years: 15.00    Types: Cigarettes, Cigars  . Smokeless tobacco: Former Neurosurgeon  . Tobacco comment: quit smoking ~ 50 yr ago  Substance Use Topics  . Alcohol use: No    Alcohol/week: 2.0 standard drinks    Types: 2 Cans of beer per week    Comment: rare beer.  . Drug use: No     Allergies   Lisinopril and Xarelto [rivaroxaban]   Review of Systems Review of Systems  Constitutional: Negative for fever.  Respiratory: Positive for  cough and shortness of breath.   Cardiovascular: Negative for chest pain and leg swelling.  Gastrointestinal: Negative for abdominal distention, nausea and vomiting.  Genitourinary: Negative for dysuria.  All other systems reviewed and are negative.    Physical Exam Updated Vital Signs BP 125/72   Pulse 70   Ht 5\' 7"  (1.702 m)   Wt 113.9 kg   SpO2 100%   BMI 39.31 kg/m   Physical Exam  Constitutional: He is oriented to person, place, and time. He appears well-developed and well-nourished.  Overweight  HENT:  Head: Normocephalic and atraumatic.  Eyes: Pupils are equal, round, and reactive to light.  Cardiovascular: Normal rate, regular rhythm and normal heart sounds.  No murmur heard. Pulmonary/Chest: Effort normal. No accessory muscle usage. No tachypnea. No respiratory distress. He has wheezes. He has rales.  Rales and wheezing most prominently in the right middle and lower lobes  Abdominal: Soft. Bowel sounds are normal. There is no tenderness. There is no rebound.  Musculoskeletal:       Right lower leg: He exhibits edema.       Left lower leg: He exhibits edema.  Trace bilateral lower extremity edema  Lymphadenopathy:    He has no cervical adenopathy.  Neurological: He is alert and oriented to person, place, and time.  Skin: Skin is warm and dry.  Psychiatric: He has a normal mood and affect.  Nursing note and vitals reviewed.    ED Treatments / Results  Labs (all labs ordered are listed, but only abnormal results are displayed) Labs Reviewed  CBC WITH DIFFERENTIAL/PLATELET - Abnormal; Notable for the following components:      Result Value   RBC 3.65 (*)    Hemoglobin 11.2 (*)    HCT 35.9 (*)    All other components within normal limits  BASIC METABOLIC PANEL - Abnormal; Notable for the following components:   Glucose, Bld 123 (*)    Creatinine, Ser 1.77 (*)    GFR calc non Af Amer 34 (*)    GFR calc Af Amer 39 (*)  All other components within normal  limits  BRAIN NATRIURETIC PEPTIDE - Abnormal; Notable for the following components:   B Natriuretic Peptide 612.3 (*)    All other components within normal limits  I-STAT TROPONIN, ED    EKG EKG Interpretation  Date/Time:  Tuesday August 01 2018 00:10:49 EDT Ventricular Rate:  70 PR Interval:    QRS Duration: 189 QT Interval:  511 QTC Calculation: 552 R Axis:   -69 Text Interpretation:  VENTRICULAR PACED RHYTHM Confirmed by Ross Marcus ([id number]) on 08/01/2018 12:23:18 AM   Radiology Dg Chest 2 View  Result Date: 07/31/2018 CLINICAL DATA:  Shortness of breath.  History of CHF. EXAM: CHEST - 2 VIEW COMPARISON:  July 28, 2018 FINDINGS: Stable cardiomegaly and AICD device. The hila and mediastinum are normal. No pneumothorax. No pulmonary nodules, masses, focal infiltrates, or overt edema. IMPRESSION: Cardiomegaly.  No overt pulmonary edema identified. Electronically Signed   By: Gerome Sam III M.D   On: 07/31/2018 23:59    Procedures Procedures (including critical care time)  Medications Ordered in ED Medications  benzonatate (TESSALON) capsule 100 mg (100 mg Oral Given 08/01/18 0038)  furosemide (LASIX) injection 40 mg (40 mg Intravenous Given 08/01/18 0113)     Initial Impression / Assessment and Plan / ED Course  I have reviewed the triage vital signs and the nursing notes.  Pertinent labs & imaging results that were available during my care of the patient were reviewed by me and considered in my medical decision making (see chart for details).     Patient presents with shortness of breath.  He is overall nontoxic-appearing.  Reports improvement with O2.  No respiratory distress.  He does have some wheezing and rales most prominently right lung.  He is afebrile.  He has several recent visits for similar symptoms.  He has not required hospitalization.  Patient reports that he has not had any weight gain and has thus not taken any increased dose of torsemide.  He does  not overtly appear volume overloaded.  EKG shows ventricularly paced rhythm.  Lab work-up is largely reassuring.  BNP is stable.  Chest x-ray shows stable cardiomegaly.  Given his clinical exam, patient may have mild pulmonary edema with the wheezing and rales on the right side.  Lower suspicion for pneumonia as patient denies infectious etiology.  He is able to ambulate and maintain his pulse ox greater than 94%.  He was given 1 dose of IV Lasix.  I discussed with him that he may need to follow-up closely with his primary physician and/or cardiologist regarding adjustment in his torsemide.  Given his recent visits, he may benefit from slight increase in diuretic.  Patient stated understanding.  After history, exam, and medical workup I feel the patient has been appropriately medically screened and is safe for discharge home. Pertinent diagnoses were discussed with the patient. Patient was given return precautions.   Final Clinical Impressions(s) / ED Diagnoses   Final diagnoses:  Shortness of breath  Acute on chronic systolic congestive heart failure [facility])    ED Discharge Orders    None       Wilkie Aye, Mayer Masker, MD 08/01/18 [phone]

## 2018-08-01 ENCOUNTER — Ambulatory Visit: Payer: Non-veteran care | Admitting: Adult Health

## 2018-08-01 LAB — BASIC METABOLIC PANEL
ANION GAP: 9 (ref 5–15)
BUN: 22 mg/dL (ref 8–23)
CHLORIDE: 106 mmol/L (ref 98–111)
CO2: 27 mmol/L (ref 22–32)
CREATININE: 1.77 mg/dL — AB (ref 0.61–1.24)
Calcium: 8.9 mg/dL (ref 8.9–10.3)
GFR calc non Af Amer: 34 mL/min — ABNORMAL LOW (ref >60)
GFR, EST AFRICAN AMERICAN: 39 mL/min — AB (ref >60)
Glucose, Bld: 123 mg/dL — ABNORMAL HIGH (ref 70–99)
Potassium: 4 mmol/L (ref 3.5–5.1)
SODIUM: 142 mmol/L (ref 135–145)

## 2018-08-01 LAB — BRAIN NATRIURETIC PEPTIDE: B NATRIURETIC PEPTIDE 5: 612.3 pg/mL — AB (ref 0.0–100.0)

## 2018-08-01 MED ORDER — FUROSEMIDE 10 MG/ML IJ SOLN
40.0000 mg | Freq: Once | INTRAMUSCULAR | Status: AC
  Administered 2018-08-01: 40 mg via INTRAVENOUS
  Filled 2018-08-01: qty 4

## 2018-08-01 NOTE — ED Notes (Signed)
Pt. accompanied by tech for walk throughout hallway. Sats were between 95-98% during duration of walk. No complaints of dizziness/SOB, gait was normal. O2 therapy in room discontinued at this time.

## 2018-08-01 NOTE — Discharge Instructions (Addendum)
He was seen today for shortness of breath.  Your work-up is largely reassuring.  You may have some mild volume overload.  Follow-up closely with your primary doctor and/or your cardiologist.  You may need to adjust her torsemide given several recent visits for shortness of breath.

## 2018-08-02 LAB — BASIC METABOLIC PANEL
BUN/Creatinine Ratio: 14 (ref 10–24)
BUN: 24 mg/dL (ref 8–27)
CALCIUM: 9.3 mg/dL (ref 8.6–10.2)
CHLORIDE: 102 mmol/L (ref 96–106)
CO2: 22 mmol/L (ref 20–29)
Creatinine, Ser: 1.76 mg/dL — ABNORMAL HIGH (ref 0.76–1.27)
GFR, EST AFRICAN AMERICAN: 41 mL/min/{1.73_m2} — AB (ref >59)
GFR, EST NON AFRICAN AMERICAN: 35 mL/min/{1.73_m2} — AB (ref >59)
Glucose: 153 mg/dL — ABNORMAL HIGH (ref 65–99)
POTASSIUM: 4 mmol/L (ref 3.5–5.2)
Sodium: 142 mmol/L (ref 134–144)

## 2018-08-03 ENCOUNTER — Telehealth: Payer: Self-pay | Admitting: *Deleted

## 2018-08-03 NOTE — Telephone Encounter (Signed)
NOTES FAXED TO [facility] FROM [facility].

## 2018-08-04 ENCOUNTER — Ambulatory Visit: Payer: Non-veteran care | Admitting: Cardiology

## 2018-08-07 NOTE — Progress Notes (Signed)
Cardiology Office Note   Date:  08/08/2018   ID:  Ryan Macdonald, DOB 1933-12-13, MRN [id number]  PCP:  [facility]  Cardiologist:  Dr.[place]  Chief Complaint  Patient presents with  . Follow-up  . Congestive Heart Failure  . Cardiomyopathy  . Hospitalization Follow-up     History of Present Illness: Ryan Macdonald is a 84 y.o. male who presents for post hospital follow up after admission for acute on chronic combined systolic and diastolic heart failure on 07/20/2018, with history of NICM, PAF, prior ICD, HTN, hyperlipidemia, and CKD Stage III. During admission he was diuresed on IV lasix and transitioned to po torsemide with dry weight of 253.7 lbs. Isosorbide was discontinued due to soft BP, and continued on BB and hydralazine. No ACE or ARB secondary to angioedema.   He comes today without further cardiac complaints. He has been changed to torsemide from lasix and is finding it easier to keep his weight and fluid under control.   [facility] needs. Medications are provided via the [place]. His ICD is checked remotely.   Past Medical History:  Diagnosis Date  . Atrial fibrillation (HCC)   . Cardiomyopathy (HCC)   . Cat scratch fever    removed mass on right side neck  . Cataracts, bilateral   . CHF (congestive heart failure) (HCC)   . Chronic kidney disease   . Corns and callosities   . Diabetes mellitus without complication (HCC)    TYPE 2  . Dyspnea   . Erythema intertrigo   . Flat foot   . Gout   . High cholesterol   . Hx of urethral stricture   . Hypertension   . Kidney stones   . Morbid obesity (HCC)   . Nonischemic cardiomyopathy (HCC)    a. ? 2009 Cath in [place] - nl cors per pt;  b. 09/2013 Echo: EF 25-30%, sev diff HK.  . Obesity   . Osteoarthritis   . PAF (paroxysmal atrial fibrillation) (HCC)    a. post-op hip in 2014 - prev on xarelto.  . Renal insufficiency     Past Surgical History:  Procedure  Laterality Date  . CARDIAC CATHETERIZATION  1980's  . CIRCUMCISION  1940's  . CYSTOSCOPY WITH URETHRAL DILATATION  10/23/2013  . CYSTOSCOPY WITH URETHRAL DILATATION N/A 10/23/2013   Procedure: CYSTOSCOPY WITH URETHRAL DILATATION;  Surgeon: Kathi Ludwig, MD;  Location[facility] OR;  Service: Urology;  Laterality: N/A;  . INCISION AND DRAINAGE OF WOUND Right 1980's   "cat scratch" (10/23/2013)  . INGUINAL HERNIA REPAIR Right 1950's  . KIDNEY STONE SURGERY Right 1970's; 1983   "twice"  . TONSILLECTOMY AND ADENOIDECTOMY  1940's  . TOTAL HIP ARTHROPLASTY Left 10/23/2013   Procedure: TOTAL HIP ARTHROPLASTY;  Surgeon: Valeria Batman, MD;  Location: [facility] OR;  Service: Orthopedics;  Laterality: Left;     Current Outpatient Medications  Medication Sig Dispense Refill  . allopurinol (ZYLOPRIM) 100 MG tablet Take 200 mg by mouth daily.    Marland Kitchen amiodarone (PACERONE) 200 MG tablet Take 1 tablet (200 mg total) by mouth 2 (two) times daily. 60 tablet 0  . apixaban (ELIQUIS) 2.5 MG TABS tablet Take 1 tablet (2.5 mg total) by mouth 2 (two) times daily. 90 tablet 0  . carvedilol (COREG) 12.5 MG tablet Take 0.5 tablets (6.25 mg total) by mouth 2 (two) times daily with a meal. 30 tablet 0  . cyanocobalamin 1000  MCG tablet Take 1,000 mcg by mouth daily.     . diclofenac sodium (VOLTAREN) 1 % GEL Apply 4 g topically 4 (four) times daily. (Patient taking differently: Apply 4 g topically 4 (four) times daily as needed (pain). ) 1 Tube 0  . docusate sodium (COLACE) 100 MG capsule Take 1 capsule (100 mg total) by mouth 2 (two) times daily as needed for mild constipation. 10 capsule 0  . finasteride (PROSCAR) 5 MG tablet Take 5 mg by mouth daily.    . fluticasone furoate-vilanterol (BREO ELLIPTA) 100-25 MCG/INH AEPB Inhale 1 puff into the lungs daily. 60 each 3  . glipiZIDE (GLUCOTROL) 5 MG tablet Take 5 mg by mouth daily before breakfast.    . HYDROcodone-acetaminophen (NORCO/VICODIN) 5-325 MG tablet Take 2  tablets by mouth every 4 (four) hours as needed. (Patient taking differently: Take 1 tablet by mouth 2 (two) times daily as needed for moderate pain. ) 6 tablet 0  . HYDROXYZINE HCL PO Take 1 tablet by mouth at bedtime as needed (sleep).    Marland Kitchen ipratropium-albuterol (DUONEB) 0.5-2.5 (3) MG/3ML SOLN Take 3 mLs by nebulization every 4 (four) hours as needed. (Patient taking differently: Take 3 mLs by nebulization every 4 (four) hours as needed (shortness of breath). ) 360 mL 0  . ketoconazole (NIZORAL) 2 % shampoo Apply 1 application topically 2 (two) times a week.    Marland Kitchen PRESCRIPTION MEDICATION Apply 1 application topically daily as needed (pain). ThermaGel    . rosuvastatin (CRESTOR) 20 MG tablet Take 1 tablet (20 mg total) by mouth daily at 6 PM. 90 tablet 2  . sertraline (ZOLOFT) 25 MG tablet Take 25-50 mg by mouth daily as needed (for anxiety).     . tamsulosin (FLOMAX) 0.4 MG CAPS capsule Take 0.4 mg by mouth at bedtime.    . torsemide (DEMADEX) 20 MG tablet Take one 20mg  tablet daily.  However, if weight up 3lbs in 24 hours take an extra dose that day.  If weight is below your dry weight skip your dose that day. 120 tablet 3   No current facility-administered medications for this visit.     Allergies:   Lisinopril and Xarelto [rivaroxaban]    Social History:  The patient  reports that he has quit smoking. His smoking use included cigarettes and cigars. He has a 15.00 pack-year smoking history. He has quit using smokeless tobacco. He reports that he does not drink alcohol or use drugs.   Family History:  The patient's family history includes Heart Problems in his maternal grandmother; Heart disease in his mother; Other in his unknown relative.    ROS: All other systems are reviewed and negative. Unless otherwise mentioned in H&P    PHYSICAL EXAM: VS:  BP 136/77   Pulse 71   Ht 5\' 7"  (1.702 m)   Wt 249 lb 12.8 oz (113.3 kg)   BMI 39.12 kg/m  , BMI Body mass index is 39.12 kg/m. GEN:  Well nourished, well developed, in no acute distress  Morbidly obese  HEENT: normal  Neck: no JVD, carotid bruits, or masses Cardiac: RRR; no murmurs, rubs, or gallops,no edema  Respiratory:  clear to auscultation bilaterally, normal work of breathing GI: soft, nontender, nondistended, + BS MS: no deformity or atrophy  Skin: warm and dry, no rash Neuro:  Strength and sensation are intact Psych: euthymic mood, full affect   EKG:  Demand pacing 65 bpm.   Recent Labs: 02/16/2018: Magnesium 1.8 04/30/2018: ALT 25 07/31/2018: B  Natriuretic Peptide 612.3; Hemoglobin 11.2; Platelets 215 08/01/2018: BUN 24; Creatinine, Ser 1.76; Potassium 4.0; Sodium 142    Lipid Panel    Component Value Date/Time   CHOL 196 05/19/2018 0013   TRIG 116 May 19, 2018 0013   HDL 32 (L) 05/19/2018 0013   CHOLHDL 6.1 May 19, 2018 0013   VLDL 23 05-19-18 0013   LDLCALC 141 (H) 05-19-2018 0013      Wt Readings from Last 3 Encounters:  08/08/18 249 lb 12.8 oz (113.3 kg)  07/31/18 251 lb (113.9 kg)  07/20/18 253 lb 11.2 oz (115.1 kg)      Other studies Reviewed: Echocardiogram 2018/05/19  Left ventricle: The cavity size was moderately dilated. Wall   thickness was normal. Systolic function was severely reduced. The   estimated ejection fraction was in the range of 20% to 25%.   Diffuse hypokinesis. Features are consistent with a pseudonormal   left ventricular filling pattern, with concomitant abnormal   relaxation and increased filling pressure (grade 2 diastolic   dysfunction). Doppler parameters are consistent with high   ventricular filling pressure. - Aortic valve: There was trivial regurgitation. - Mitral valve: There was mild regurgitation. Valve area by   pressure half-time: 1.09 cm^2. - Left atrium: The atrium was mildly dilated. - Right ventricle: Pacer wire or catheter noted in right ventricle.   Systolic function was reduced.  ASSESSMENT AND PLAN:  1. Chronic combined CHF: Recent admission  07/20/2018. He was diuresed with IV lasix and transitioned to torsemide 20 mg daily. He has been weighing daily and not always avoiding salt, but doing his best. Weight is down to 249 lbs from discharge weight of 253 lbs. He is without complaints of dyspnea and is without fluid overload on exam. He will continue current medication regimen. [facility]   2 NICM: Most recent echocardiogram 2018/05/19 revealed EF of 20%-25%. He is continued on coreg 12.5 mg BID. He is not a candidate for ARB or ACE due to CKD.   3. Hypertension: BP is elevated today. He came very early and had to wait for his appointment time. This caused significant aggravation to him. Will follow up on BPs. He may need to be placed on hydralazine on next appointment if BP is not adequately controlled for decreased EF.   4. Hx of Atrial fib: He remains on amiodarone and renal dose of Eliquis.    Current medicines are reviewed at length with the patient today.    Labs/ tests ordered today include: None. Labs being done by [place] this week.   Bettey Mare. Liborio Nixon, ANP, AACC   08/08/2018 5:19 PM    [facility] [address], [place], [place] [address] Phone: [phone]; Fax: [phone]

## 2018-08-08 ENCOUNTER — Encounter: Payer: Self-pay | Admitting: Adult Health

## 2018-08-08 ENCOUNTER — Ambulatory Visit (INDEPENDENT_AMBULATORY_CARE_PROVIDER_SITE_OTHER): Payer: No Typology Code available for payment source | Admitting: Adult Health

## 2018-08-08 VITALS — BP 136/77 | HR 71 | Ht 67.0 in | Wt 249.8 lb

## 2018-08-08 DIAGNOSIS — I10 Essential (primary) hypertension: Secondary | ICD-10-CM | POA: Diagnosis not present

## 2018-08-08 DIAGNOSIS — I428 Other cardiomyopathies: Secondary | ICD-10-CM | POA: Diagnosis not present

## 2018-08-08 DIAGNOSIS — I480 Paroxysmal atrial fibrillation: Secondary | ICD-10-CM

## 2018-08-08 DIAGNOSIS — I5042 Chronic combined systolic (congestive) and diastolic (congestive) heart failure: Secondary | ICD-10-CM | POA: Diagnosis not present

## 2018-08-08 NOTE — Patient Instructions (Signed)
Samara Deist NP recommends that you take torsemide on empty stomach   Please schedule a follow-up appointment in THREE MONTHS with Dr. Duke Salvia

## 2018-08-14 ENCOUNTER — Encounter (HOSPITAL_COMMUNITY): Payer: Self-pay | Admitting: Emergency Medicine

## 2018-08-14 ENCOUNTER — Emergency Department (HOSPITAL_COMMUNITY): Payer: No Typology Code available for payment source

## 2018-08-14 ENCOUNTER — Inpatient Hospital Stay (HOSPITAL_COMMUNITY)
Admission: EM | Admit: 2018-08-14 | Discharge: 2018-08-19 | DRG: 246 | Disposition: A | Discharge status: Home / Self Care | Payer: No Typology Code available for payment source | Attending: Internal Medicine | Admitting: Internal Medicine

## 2018-08-14 ENCOUNTER — Other Ambulatory Visit: Payer: Self-pay

## 2018-08-14 DIAGNOSIS — Z888 Allergy status to other drugs, medicaments and biological substances status: Secondary | ICD-10-CM

## 2018-08-14 DIAGNOSIS — I130 Hypertensive heart and chronic kidney disease with heart failure and stage 1 through stage 4 chronic kidney disease, or unspecified chronic kidney disease: Principal | ICD-10-CM | POA: Diagnosis present

## 2018-08-14 DIAGNOSIS — M1990 Unspecified osteoarthritis, unspecified site: Secondary | ICD-10-CM | POA: Diagnosis present

## 2018-08-14 DIAGNOSIS — I4891 Unspecified atrial fibrillation: Secondary | ICD-10-CM | POA: Diagnosis present

## 2018-08-14 DIAGNOSIS — M109 Gout, unspecified: Secondary | ICD-10-CM | POA: Diagnosis present

## 2018-08-14 DIAGNOSIS — Z6839 Body mass index (BMI) 39.0-39.9, adult: Secondary | ICD-10-CM

## 2018-08-14 DIAGNOSIS — R0602 Shortness of breath: Secondary | ICD-10-CM | POA: Diagnosis not present

## 2018-08-14 DIAGNOSIS — I480 Paroxysmal atrial fibrillation: Secondary | ICD-10-CM | POA: Diagnosis present

## 2018-08-14 DIAGNOSIS — M2140 Flat foot [pes planus] (acquired), unspecified foot: Secondary | ICD-10-CM | POA: Diagnosis present

## 2018-08-14 DIAGNOSIS — E785 Hyperlipidemia, unspecified: Secondary | ICD-10-CM | POA: Diagnosis present

## 2018-08-14 DIAGNOSIS — I080 Rheumatic disorders of both mitral and aortic valves: Secondary | ICD-10-CM | POA: Diagnosis present

## 2018-08-14 DIAGNOSIS — Z7901 Long term (current) use of anticoagulants: Secondary | ICD-10-CM

## 2018-08-14 DIAGNOSIS — Z95810 Presence of automatic (implantable) cardiac defibrillator: Secondary | ICD-10-CM | POA: Diagnosis present

## 2018-08-14 DIAGNOSIS — Z87442 Personal history of urinary calculi: Secondary | ICD-10-CM

## 2018-08-14 DIAGNOSIS — I10 Essential (primary) hypertension: Secondary | ICD-10-CM | POA: Diagnosis present

## 2018-08-14 DIAGNOSIS — I2119 ST elevation (STEMI) myocardial infarction involving other coronary artery of inferior wall: Secondary | ICD-10-CM | POA: Diagnosis present

## 2018-08-14 DIAGNOSIS — I2510 Atherosclerotic heart disease of native coronary artery without angina pectoris: Secondary | ICD-10-CM | POA: Diagnosis present

## 2018-08-14 DIAGNOSIS — E669 Obesity, unspecified: Secondary | ICD-10-CM | POA: Diagnosis present

## 2018-08-14 DIAGNOSIS — N183 Chronic kidney disease, stage 3 unspecified: Secondary | ICD-10-CM | POA: Diagnosis present

## 2018-08-14 DIAGNOSIS — I428 Other cardiomyopathies: Secondary | ICD-10-CM

## 2018-08-14 DIAGNOSIS — I255 Ischemic cardiomyopathy: Secondary | ICD-10-CM | POA: Diagnosis present

## 2018-08-14 DIAGNOSIS — Z8249 Family history of ischemic heart disease and other diseases of the circulatory system: Secondary | ICD-10-CM

## 2018-08-14 DIAGNOSIS — J810 Acute pulmonary edema: Secondary | ICD-10-CM

## 2018-08-14 DIAGNOSIS — Z7984 Long term (current) use of oral hypoglycemic drugs: Secondary | ICD-10-CM

## 2018-08-14 DIAGNOSIS — I509 Heart failure, unspecified: Secondary | ICD-10-CM

## 2018-08-14 DIAGNOSIS — R0603 Acute respiratory distress: Secondary | ICD-10-CM

## 2018-08-14 DIAGNOSIS — Z87891 Personal history of nicotine dependence: Secondary | ICD-10-CM

## 2018-08-14 DIAGNOSIS — Z79899 Other long term (current) drug therapy: Secondary | ICD-10-CM

## 2018-08-14 DIAGNOSIS — I5043 Acute on chronic combined systolic (congestive) and diastolic (congestive) heart failure: Secondary | ICD-10-CM | POA: Diagnosis present

## 2018-08-14 DIAGNOSIS — I5023 Acute on chronic systolic (congestive) heart failure: Secondary | ICD-10-CM | POA: Diagnosis not present

## 2018-08-14 DIAGNOSIS — Z833 Family history of diabetes mellitus: Secondary | ICD-10-CM

## 2018-08-14 DIAGNOSIS — Z955 Presence of coronary angioplasty implant and graft: Secondary | ICD-10-CM

## 2018-08-14 DIAGNOSIS — I2129 ST elevation (STEMI) myocardial infarction involving other sites: Secondary | ICD-10-CM | POA: Diagnosis present

## 2018-08-14 DIAGNOSIS — E1122 Type 2 diabetes mellitus with diabetic chronic kidney disease: Secondary | ICD-10-CM | POA: Diagnosis present

## 2018-08-14 DIAGNOSIS — Z23 Encounter for immunization: Secondary | ICD-10-CM

## 2018-08-14 DIAGNOSIS — N2889 Other specified disorders of kidney and ureter: Secondary | ICD-10-CM | POA: Diagnosis present

## 2018-08-14 LAB — CBC WITH DIFFERENTIAL/PLATELET
ABS IMMATURE GRANULOCYTES: 0 10*3/uL (ref 0.0–0.1)
BASOS PCT: 0 %
Basophils Absolute: 0 10*3/uL (ref 0.0–0.1)
Eosinophils Absolute: 0.1 10*3/uL (ref 0.0–0.7)
Eosinophils Relative: 1 %
HEMATOCRIT: 39.3 % (ref 39.0–52.0)
HEMOGLOBIN: 12.3 g/dL — AB (ref 13.0–17.0)
IMMATURE GRANULOCYTES: 0 %
LYMPHS ABS: 2.4 10*3/uL (ref 0.7–4.0)
LYMPHS PCT: 26 %
MCH: 30.8 pg (ref 26.0–34.0)
MCHC: 31.3 g/dL (ref 30.0–36.0)
MCV: 98.3 fL (ref 78.0–100.0)
MONOS PCT: 6 %
Monocytes Absolute: 0.6 10*3/uL (ref 0.1–1.0)
NEUTROS ABS: 6.2 10*3/uL (ref 1.7–7.7)
NEUTROS PCT: 67 %
PLATELETS: 250 10*3/uL (ref 150–400)
RBC: 4 MIL/uL — ABNORMAL LOW (ref 4.22–5.81)
RDW: 14.7 % (ref 11.5–15.5)
WBC: 9.3 10*3/uL (ref 4.0–10.5)

## 2018-08-14 LAB — GLUCOSE, CAPILLARY
GLUCOSE-CAPILLARY: 147 mg/dL — AB (ref 70–99)
GLUCOSE-CAPILLARY: 120 mg/dL — AB (ref 70–99)

## 2018-08-14 LAB — TROPONIN I: Troponin I: 0.07 ng/mL (ref <0.03)

## 2018-08-14 LAB — BASIC METABOLIC PANEL
ANION GAP: 13 (ref 5–15)
BUN: 19 mg/dL (ref 8–23)
CHLORIDE: 102 mmol/L (ref 98–111)
CO2: 25 mmol/L (ref 22–32)
Calcium: 9.1 mg/dL (ref 8.9–10.3)
Creatinine, Ser: 1.69 mg/dL — ABNORMAL HIGH (ref 0.61–1.24)
GFR calc non Af Amer: 36 mL/min — ABNORMAL LOW (ref >60)
GFR, EST AFRICAN AMERICAN: 42 mL/min — AB (ref >60)
Glucose, Bld: 253 mg/dL — ABNORMAL HIGH (ref 70–99)
POTASSIUM: 4 mmol/L (ref 3.5–5.1)
Sodium: 140 mmol/L (ref 135–145)

## 2018-08-14 LAB — I-STAT CHEM 8, ED
BUN: 24 mg/dL — ABNORMAL HIGH (ref 8–23)
CALCIUM ION: 1.15 mmol/L (ref 1.15–1.40)
CHLORIDE: 104 mmol/L (ref 98–111)
Creatinine, Ser: 1.7 mg/dL — ABNORMAL HIGH (ref 0.61–1.24)
Glucose, Bld: 253 mg/dL — ABNORMAL HIGH (ref 70–99)
HCT: 38 % — ABNORMAL LOW (ref 39.0–52.0)
Hemoglobin: 12.9 g/dL — ABNORMAL LOW (ref 13.0–17.0)
Potassium: 4 mmol/L (ref 3.5–5.1)
SODIUM: 142 mmol/L (ref 135–145)
TCO2: 29 mmol/L (ref 22–32)

## 2018-08-14 LAB — BRAIN NATRIURETIC PEPTIDE: B NATRIURETIC PEPTIDE 5: 497 pg/mL — AB (ref 0.0–100.0)

## 2018-08-14 LAB — I-STAT TROPONIN, ED: Troponin i, poc: 0.04 ng/mL (ref 0.00–0.08)

## 2018-08-14 MED ORDER — FUROSEMIDE 10 MG/ML IJ SOLN: 40.0000 mg | Freq: Once | INTRAMUSCULAR | Status: DC

## 2018-08-14 MED ORDER — ROSUVASTATIN CALCIUM 20 MG PO TABS
20.0000 mg | ORAL_TABLET | Freq: Every day | ORAL | Status: DC
  Administered 2018-08-14 – 2018-08-19 (×6): 20 mg via ORAL
  Filled 2018-08-14 (×6): qty 1

## 2018-08-14 MED ORDER — INFLUENZA VAC SPLIT HIGH-DOSE 0.5 ML IM SUSY
0.5000 mL | PREFILLED_SYRINGE | INTRAMUSCULAR | Status: AC
  Administered 2018-08-15: 0.5 mL via INTRAMUSCULAR
  Filled 2018-08-14: qty 0.5

## 2018-08-14 MED ORDER — APIXABAN 2.5 MG PO TABS
2.5000 mg | ORAL_TABLET | Freq: Two times a day (BID) | ORAL | Status: DC
  Administered 2018-08-14 – 2018-08-16 (×5): 2.5 mg via ORAL
  Filled 2018-08-14 (×5): qty 1

## 2018-08-14 MED ORDER — GUAIFENESIN ER 600 MG PO TB12
600.0000 mg | ORAL_TABLET | Freq: Two times a day (BID) | ORAL | Status: DC | PRN
  Administered 2018-08-14 – 2018-08-19 (×9): 600 mg via ORAL
  Filled 2018-08-14 (×10): qty 1

## 2018-08-14 MED ORDER — NITROGLYCERIN 0.4 MG SL SUBL: 0.4000 mg | SUBLINGUAL_TABLET | SUBLINGUAL | Status: DC | PRN

## 2018-08-14 MED ORDER — VITAMIN B-12 1000 MCG PO TABS
1000.0000 ug | ORAL_TABLET | Freq: Every day | ORAL | Status: DC
  Administered 2018-08-15 – 2018-08-19 (×4): 1000 ug via ORAL
  Filled 2018-08-14 (×4): qty 1

## 2018-08-14 MED ORDER — POTASSIUM CHLORIDE CRYS ER 20 MEQ PO TBCR: 20.0000 meq | EXTENDED_RELEASE_TABLET | Freq: Once | ORAL | Status: DC

## 2018-08-14 MED ORDER — PREDNISOLONE ACETATE 1 % OP SUSP: 1.0000 [drp] | Freq: Four times a day (QID) | OPHTHALMIC | Status: DC

## 2018-08-14 MED ORDER — CARVEDILOL 6.25 MG PO TABS
6.2500 mg | ORAL_TABLET | Freq: Two times a day (BID) | ORAL | Status: DC
  Administered 2018-08-14 – 2018-08-19 (×11): 6.25 mg via ORAL
  Filled 2018-08-14 (×8): qty 1
  Filled 2018-08-14: qty 2
  Filled 2018-08-14 (×2): qty 1

## 2018-08-14 MED ORDER — PREDNISOLONE ACETATE 1 % OP SUSP
1.0000 [drp] | Freq: Four times a day (QID) | OPHTHALMIC | Status: DC
  Administered 2018-08-14 – 2018-08-19 (×20): 1 [drp] via OPHTHALMIC
  Filled 2018-08-14: qty 5

## 2018-08-14 MED ORDER — ALLOPURINOL 100 MG PO TABS
200.0000 mg | ORAL_TABLET | Freq: Every day | ORAL | Status: DC
  Administered 2018-08-15 – 2018-08-19 (×4): 200 mg via ORAL
  Filled 2018-08-14 (×4): qty 2

## 2018-08-14 MED ORDER — AMIODARONE HCL 200 MG PO TABS
200.0000 mg | ORAL_TABLET | Freq: Two times a day (BID) | ORAL | Status: DC
  Administered 2018-08-14 – 2018-08-19 (×9): 200 mg via ORAL
  Filled 2018-08-14 (×9): qty 1

## 2018-08-14 MED ORDER — FUROSEMIDE 10 MG/ML IJ SOLN
40.0000 mg | Freq: Two times a day (BID) | INTRAMUSCULAR | Status: DC
  Administered 2018-08-14 – 2018-08-17 (×6): 40 mg via INTRAVENOUS
  Filled 2018-08-14 (×6): qty 4

## 2018-08-14 MED ORDER — INSULIN ASPART 100 UNIT/ML ~~LOC~~ SOLN
0.0000 [IU] | Freq: Three times a day (TID) | SUBCUTANEOUS | Status: DC
  Administered 2018-08-14 – 2018-08-19 (×9): 2 [IU] via SUBCUTANEOUS

## 2018-08-14 MED ORDER — ASPIRIN 81 MG PO CHEW: 324.0000 mg | CHEWABLE_TABLET | ORAL | Status: AC

## 2018-08-14 MED ORDER — HEPARIN SODIUM (PORCINE) 5000 UNIT/ML IJ SOLN: 5000.0000 [IU] | Freq: Three times a day (TID) | INTRAMUSCULAR | Status: DC

## 2018-08-14 MED ORDER — CIPROFLOXACIN HCL 0.3 % OP SOLN
1.0000 [drp] | Freq: Four times a day (QID) | OPHTHALMIC | Status: DC
  Administered 2018-08-14 – 2018-08-19 (×19): 1 [drp] via OPHTHALMIC
  Filled 2018-08-14: qty 2.5

## 2018-08-14 MED ORDER — CIPROFLOXACIN HCL 0.3 % OP SOLN: 1.0000 [drp] | Freq: Four times a day (QID) | OPHTHALMIC | Status: DC

## 2018-08-14 MED ORDER — NITROGLYCERIN IN D5W 200-5 MCG/ML-% IV SOLN: 0.0000 ug/min | Freq: Once | INTRAVENOUS | Status: DC

## 2018-08-14 MED ORDER — SERTRALINE HCL 25 MG PO TABS
25.0000 mg | ORAL_TABLET | Freq: Every day | ORAL | Status: DC | PRN
  Administered 2018-08-19: 25 mg via ORAL
  Filled 2018-08-14 (×3): qty 2

## 2018-08-14 MED ORDER — DOCUSATE SODIUM 100 MG PO CAPS
100.0000 mg | ORAL_CAPSULE | Freq: Two times a day (BID) | ORAL | Status: DC | PRN
  Administered 2018-08-15 – 2018-08-19 (×2): 100 mg via ORAL
  Filled 2018-08-14 (×2): qty 1

## 2018-08-14 MED ORDER — ACETAMINOPHEN 325 MG PO TABS
650.0000 mg | ORAL_TABLET | ORAL | Status: DC | PRN
  Administered 2018-08-17 – 2018-08-19 (×4): 650 mg via ORAL
  Filled 2018-08-14 (×4): qty 2

## 2018-08-14 MED ORDER — ONDANSETRON HCL 4 MG/2ML IJ SOLN: 4.0000 mg | Freq: Four times a day (QID) | INTRAMUSCULAR | Status: DC | PRN

## 2018-08-14 MED ORDER — ASPIRIN 300 MG RE SUPP: 300.0000 mg | RECTAL | Status: AC

## 2018-08-14 NOTE — ED Triage Notes (Signed)
To ED via [facility] from home, with initial c/o shortness of breath- arrived on cpap- pt is alert/oriented, w/d, hx CHF

## 2018-08-14 NOTE — ED Notes (Signed)
Report called to [facility]- pt received Guiafenisen for cough- states that he normally takes it at home.,

## 2018-08-14 NOTE — H&P (Addendum)
Cardiology Consultation:   Patient ID: Ryan Macdonald; [id number]; December 13, 1933   Admit date: 08/14/2018 Date of Consult: 08/14/2018  Primary Care Provider: [facility], [facility] Primary Cardiologist: Dr. [name], MD    Patient Profile:   Ryan Macdonald is a 84 y.o. male with a hx of an nonischemic cardiomyopathy (LVEF 20 to 25% per echocardiogram 04/2018) s/p AICD, paroxysmal atrial fibrillation on Eliquis, hypertension, DM2,  hyperlipidemia and CKD stage III who is being seen today for the evaluation of acute on chronic CHF exacerbation.   History of Present Illness:   Ryan Macdonald is an 84 year old male with a history stated above who presented to [facility] on 08/14/2018 after waking this morning "not feeling well" and was mildly SOB. Pt states that he is well in-tuned to his health and can tell when he needs to go to the emergency department. He states that he woke this morning and took his BP and weight (which were noted to be normal per pt) and took his medications as usual, however became mildly short of breath and felt that he would need to be seen in the ED as this was his exact presentation as his last hospitalization several weeks ago. Upon EMS arrival, pt was severely SOB per chart review and required Bipap for transport to [facility]. His BP was markedly elevated with a SBP in the 200's. He was given SL NTG and his BP normalized by arrival. He was also noted to be in atrial fibrillation in the ED with rate control.   During our encounter patient is comfortable on nasal cannula without respiratory distress. He reports complete diet and medication compliance since his last follow up appointment on 08/08/18.  Denies chest pain, palpitations, dizziness, orthopnea, change in LE swelling or weight, PND, presyncope or syncopal episodes. He denies recent illness however, reports ongoing cough for approximately 2 weeks in duration.  In the ED, EKG revealed NSR with rate control and no acute  ischemic changes. BNP was elevated at 497. I-STAT troponin mildly elevated at 0.04 with no anginal symptoms. Creatinine elevated at 1.70. CXR with cardiomegaly and vascular congestion.  Of note patient was last seen in the office on 08/08/2018 for post hospital follow-up after an admission for acute on chronic combined systolic and diastolic heart failure on 07/20/2018.  During that admission, he was diuresed on IV Lasix and transition to PO torsemide with a dry weight of 253.7lbs.  His isosorbide was discontinued secondary to soft BP however, he was continued on his beta-blocker and hydralazine.  He has a known allergy to ACE inhibitors.  During this visit, he had no further cardiac complaints and found it much easier to keep his weight and fluid balance under control with the transition to torsemide from Lasix.  It was noted that he is followed at the [facility] by Dr. Len Childs for PCP needs.  His medications are provided by the [place].  Past Medical History:  Diagnosis Date  . Atrial fibrillation (HCC)   . Cardiomyopathy (HCC)   . Cat scratch fever    removed mass on right side neck  . Cataracts, bilateral   . CHF (congestive heart failure) (HCC)   . Chronic kidney disease   . Corns and callosities   . Diabetes mellitus without complication (HCC)    TYPE 2  . Dyspnea   . Erythema intertrigo   . Flat foot   . Gout   . High cholesterol   . Hx of urethral stricture   .  Hypertension   . Kidney stones   . Morbid obesity (HCC)   . Nonischemic cardiomyopathy (HCC)    a. ? 2009 Cath in [place] - nl cors per pt;  b. 09/2013 Echo: EF 25-30%, sev diff HK.  . Obesity   . Osteoarthritis   . PAF (paroxysmal atrial fibrillation) (HCC)    a. post-op hip in 2014 - prev on xarelto.  . Renal insufficiency     Past Surgical History:  Procedure Laterality Date  . CARDIAC CATHETERIZATION  1980's  . CIRCUMCISION  1940's  . CYSTOSCOPY WITH URETHRAL DILATATION  10/23/2013  . CYSTOSCOPY WITH URETHRAL DILATATION N/A  10/23/2013   Procedure: CYSTOSCOPY WITH URETHRAL DILATATION;  Surgeon: Kathi Ludwig, MD;  Location: [facility] OR;  Service: Urology;  Laterality: N/A;  . INCISION AND DRAINAGE OF WOUND Right 1980's   "cat scratch" (10/23/2013)  . INGUINAL HERNIA REPAIR Right 1950's  . KIDNEY STONE SURGERY Right 1970's; 1983   "twice"  . TONSILLECTOMY AND ADENOIDECTOMY  1940's  . TOTAL HIP ARTHROPLASTY Left 10/23/2013   Procedure: TOTAL HIP ARTHROPLASTY;  Surgeon: Valeria Batman, MD;  Location: [facility] OR;  Service: Orthopedics;  Laterality: Left;     Prior to Admission medications   Medication Sig Start Date End Date Taking? Authorizing Provider  allopurinol (ZYLOPRIM) 100 MG tablet Take 200 mg by mouth daily.   Yes [provider]  amiodarone (PACERONE) 200 MG tablet Take 1 tablet (200 mg total) by mouth 2 (two) times daily. 02/16/18  Yes Santos-Sanchez, Chelsea Primus, MD  apixaban (ELIQUIS) 2.5 MG TABS tablet Take 1 tablet (2.5 mg total) by mouth 2 (two) times daily. 05/04/18  Yes Angelita Ingles, MD  carvedilol (COREG) 12.5 MG tablet Take 0.5 tablets (6.25 mg total) by mouth 2 (two) times daily with a meal. 04/15/17  Yes Osvaldo Shipper, MD  cyanocobalamin 1000 MCG tablet Take 1,000 mcg by mouth daily.    Yes [provider]  diclofenac sodium (VOLTAREN) 1 % GEL Apply 4 g topically 4 (four) times daily. Patient taking differently: Apply 4 g topically 4 (four) times daily as needed (pain).  07/10/17  Yes Valentino Nose, MD  docusate sodium (COLACE) 100 MG capsule Take 1 capsule (100 mg total) by mouth 2 (two) times daily as needed for mild constipation. 07/20/18  Yes Barrett, Joline Salt, PA-C  finasteride (PROSCAR) 5 MG tablet Take 5 mg by mouth daily as needed (unk).    Yes [provider]  glipiZIDE (GLUCOTROL) 5 MG tablet Take 5 mg by mouth daily before breakfast.   Yes [provider]  HYDROcodone-acetaminophen (NORCO/VICODIN) 5-325 MG tablet Take 2 tablets by mouth every 4  (four) hours as needed. Patient taking differently: Take 1 tablet by mouth 2 (two) times daily as needed for moderate pain.  04/09/16  Yes Isa Rankin, MD  HYDROXYZINE HCL PO Take 1 tablet by mouth at bedtime as needed (sleep).   Yes [provider]  ipratropium-albuterol (DUONEB) 0.5-2.5 (3) MG/3ML SOLN Take 3 mLs by nebulization every 4 (four) hours as needed. Patient taking differently: Take 3 mLs by nebulization every 4 (four) hours as needed (shortness of breath).  05/14/17  Yes Pearson Grippe, MD  ketoconazole (NIZORAL) 2 % shampoo Apply 1 application topically 2 (two) times a week.   Yes [provider]  PRESCRIPTION MEDICATION Apply 1 application topically daily as needed (pain). ThermaGel   Yes [provider]  sertraline (ZOLOFT) 25 MG tablet Take 25-50 mg by mouth daily as  needed (for anxiety).    Yes [provider]  tamsulosin (FLOMAX) 0.4 MG CAPS capsule Take 0.4 mg by mouth at bedtime.   Yes [provider]  torsemide (DEMADEX) 20 MG tablet Take one 20mg  tablet daily.  However, if weight up 3lbs in 24 hours take an extra dose that day.  If weight is below your dry weight skip your dose that day. 07/20/18  Yes Barrett, Joline Salt, PA-C  fluticasone furoate-vilanterol (BREO ELLIPTA) 100-25 MCG/INH AEPB Inhale 1 puff into the lungs daily. Patient not taking: Reported on 08/14/2018 05/05/18   Angelita Ingles, MD  rosuvastatin (CRESTOR) 20 MG tablet Take 1 tablet (20 mg total) by mouth daily at 6 PM. Patient not taking: Reported on 08/14/2018 05/04/18   Angelita Ingles, MD    Inpatient Medications: Scheduled Meds: . furosemide  40 mg Intravenous Once  . potassium chloride  20 mEq Oral Once   Continuous Infusions: . nitroGLYCERIN     PRN Meds:   Allergies:    Allergies  Allergen Reactions  . Lisinopril Swelling    Facial swelling  . Xarelto [Rivaroxaban]     bleeding    Social History:   Social History   Socioeconomic History  .  Marital status: Married    Spouse name: Not on file  . Number of children: Not on file  . Years of education: Not on file  . Highest education level: Not on file  Occupational History  . Occupation: Retired    Comment: Former Patent examiner  . Financial resource strain: Not on file  . Food insecurity:    Worry: Not on file    Inability: Not on file  . Transportation needs:    Medical: Not on file    Non-medical: Not on file  Tobacco Use  . Smoking status: Former Smoker    Packs/day: 1.00    Years: 15.00    Pack years: 15.00    Types: Cigarettes, Cigars  . Smokeless tobacco: Former Neurosurgeon  . Tobacco comment: quit smoking ~ 50 yr ago  Substance and Sexual Activity  . Alcohol use: No    Alcohol/week: 2.0 standard drinks    Types: 2 Cans of beer per week    Comment: rare beer.  . Drug use: No  . Sexual activity: Yes  Lifestyle  . Physical activity:    Days per week: Not on file    Minutes per session: Not on file  . Stress: Not on file  Relationships  . Social connections:    Talks on phone: Not on file    Gets together: Not on file    Attends religious service: Not on file    Active member of club or organization: Not on file    Attends meetings of clubs or organizations: Not on file    Relationship status: Not on file  . Intimate partner violence:    Fear of current or ex partner: Not on file    Emotionally abused: Not on file    Physically abused: Not on file    Forced sexual activity: Not on file  Other Topics Concern  . Not on file  Social History Narrative   Lives in [place] with wife. Moved from [place] (2013). Does not routinely exercise.    Family History:   Family History  Problem Relation Age of Onset  . Heart disease Mother   . Heart Problems Maternal Grandmother   . Other Unknown  negative for premature CAD   Family Status:  Family Status  Relation Name Status  . Father  Deceased at age 65's       pulmonary comps  . Mother  Deceased  at age 8       cerebral hemmorhage  . Brother  Deceased at age 23 months       SIDS  . Sister  Deceased at age 76       lupus  . Sister  Deceased at age 82's       comps of diabetes  . MGM  Deceased at age 63  . MGF  Deceased at age 58  . Unknown  (Not Specified)  . PGM  Deceased  . PGF  Deceased    ROS:  Please see the history of present illness.  All other ROS reviewed and negative.     Physical Exam/Data:   Vitals:   08/14/18 0730 08/14/18 0827 08/14/18 0930 08/14/18 1000  BP: 117/74  (!) 125/92 116/75  Pulse: 79 80 73 80  Resp: (!) 27 (!) 22 (!) 22 20  Temp:      TempSrc:      SpO2: 96% 99% 98% 98%  Weight:      Height:        Intake/Output Summary (Last 24 hours) at 08/14/2018 1137 Last data filed at 08/14/2018 1006 Gross per 24 hour  Intake -  Output 700 ml  Net -700 ml   Filed Weights   08/14/18 0720  Weight: 112.9 kg   Body mass index is 39 kg/m.   General: Obese, NAD Skin: Warm, dry, intact  Head: Normocephalic, atraumatic, clear, moist mucus membranes. Neck: Negative for carotid bruits. No JVD Lungs: Diminished in bilateral bases. No wheezes, rales, or rhonchi. Breathing is unlabored. Cardiovascular: RRR with S1 S2. No murmurs, rubs, gallops, or LV heave appreciated. Abdomen: Soft, non-tender, non-distended with normoactive bowel sounds. No obvious abdominal masses. MSK: Strength and tone appear normal for age. 5/5 in all extremities Extremities: Mild 1+ BLE edema, unchanged. No clubbing or cyanosis. DP/PT pulses 1+ bilaterally Neuro: Alert and oriented. No focal deficits. No facial asymmetry. MAE spontaneously. Psych: Responds to questions appropriately with normal affect.     EKG: The EKG was personally reviewed and demonstrates: 08/14/2018 NSR with rate control, there is artifact present  Telemetry:  Telemetry was personally reviewed and demonstrates: V-paced, HR 70  Relevant CV Studies:  Echocardiogram 04/30/2018  Left ventricle: The cavity  size was moderately dilated. Wall thickness was normal. Systolic function was severely reduced. The estimated ejection fraction was in the range of 20% to 25%. Diffuse hypokinesis. Features are consistent with a pseudonormal left ventricular filling pattern, with concomitant abnormal relaxation and increased filling pressure (grade 2 diastolic dysfunction). Doppler parameters are consistent with high ventricular filling pressure. - Aortic valve: There was trivial regurgitation. - Mitral valve: There was mild regurgitation. Valve area by pressure half-time: 1.09 cm^2. - Left atrium: The atrium was mildly dilated. - Right ventricle: Pacer wire or catheter noted in right ventricle. Systolic function was reduced.  CATH: None   Laboratory Data:  Chemistry Recent Labs  Lab 08/14/18 0723 08/14/18 0740  NA 140 142  K 4.0 4.0  CL 102 104  CO2 25  --   GLUCOSE 253* 253*  BUN 19 24*  CREATININE 1.69* 1.70*  CALCIUM 9.1  --   GFRNONAA 36*  --   GFRAA 42*  --   ANIONGAP 13  --  Total Protein  Date Value Ref Range Status  04/30/2018 6.9 6.5 - 8.1 g/dL Final   Albumin  Date Value Ref Range Status  04/30/2018 3.0 (L) 3.5 - 5.0 g/dL Final   AST  Date Value Ref Range Status  04/30/2018 33 15 - 41 U/L Final   ALT  Date Value Ref Range Status  04/30/2018 25 17 - 63 U/L Final   Alkaline Phosphatase  Date Value Ref Range Status  04/30/2018 44 38 - 126 U/L Final   Total Bilirubin  Date Value Ref Range Status  04/30/2018 0.7 0.3 - 1.2 mg/dL Final   Hematology Recent Labs  Lab 08/14/18 0723 08/14/18 0740  WBC 9.3  --   RBC 4.00*  --   HGB 12.3* 12.9*  HCT 39.3 38.0*  MCV 98.3  --   MCH 30.8  --   MCHC 31.3  --   RDW 14.7  --   PLT 250  --    Cardiac EnzymesNo results for input(s): TROPONINI in the last 168 hours.  Recent Labs  Lab 08/14/18 0732  TROPIPOC 0.04    BNP Recent Labs  Lab 08/14/18 0723  BNP 497.0*    DDimer No results for  input(s): DDIMER in the last 168 hours. TSH:  Lab Results  Component Value Date   TSH 6.935 (H) 07/08/2017   Lipids: Lab Results  Component Value Date   CHOL 196 04/30/2018   HDL 32 (L) 04/30/2018   LDLCALC 141 (H) 04/30/2018   TRIG 116 04/30/2018   CHOLHDL 6.1 04/30/2018   HgbA1c: Lab Results  Component Value Date   HGBA1C 6.3 (H) 02/03/2017    Radiology/Studies:  Dg Chest Port 1 View  Result Date: 08/14/2018 CLINICAL DATA:  Respiratory distress EXAM: PORTABLE CHEST 1 VIEW COMPARISON:  07/31/2018 FINDINGS: Single chamber ICD lead into the right ventricle. There is cardiomegaly and aortic tortuosity. Cardiomediastinal contours are distorted by rightward rotation. Vascular congestion without Kerley lines or air bronchogram. No pneumothorax. IMPRESSION: Cardiomegaly and vascular congestion. Electronically Signed   By: Marnee Spring M.D.   On: 08/14/2018 07:18   Assessment and Plan:   1.  Acute on chronic combined systolic and diastolic CHF exacerbation: -Recently hospitalized 07/20/2018 and was last seen in follow-up on 08/08/2018 and doing well with no complaints. During that hospital admission his Lasix was transitioned to torsemide 20 mg daily. Pt woke this AM not feeling well, found to be in respiratory distress with s/s of acute on chronic CHF exacerbation requiring Bipap ventilation -BNP 497 today>>was in the 600's last admission  -EKG with NSR with artifact present  -Troponin, istat 0.04 with no anginal symptoms  -BP 136/78>116/75>125/92 -Weight, 249lb today, consistent with OP weight on 08/08/2018 249lbs -I&O, net negative today  -Creatinine, 1.70 today>>>baseline appears to be in the 1.5 range  -It is possible that the pt is paroxysmally going into AF which is acutely  exacerbating his CHF versus true volume overload -We will admit him and diurese him further with IV Lasix and follow renal function and diuresis closely  -Strict I&O, daily weights -Daily BMET    2.  Nonischemic cardiomyopathy: -Last echocardiogram 04/30/2018 with LVEF of 20 to 25% with diffuse hypokinesis and G2 DD -Continue carvedilol 12.5 mg twice daily, statin -Not a candidate for ACE or ARB secondary to allergy  3.  Hypertension: -Stable, 136/78>116/75>125/92 -Consider renal doppler to r/o renal artery stenosis given abrupt elevations in BP  -Continue low dose BB   3.  History of  paroxysmal atrial fibrillation: -EKG with NSR with artifact today, rate controlled mid 90s -Continue amiodarone 200 mg twice daily and renal dose Eliquis 2.5 mg twice daily  -Monitor telemetry closely for s/s of AF  -CHA2DS2VASc =5 ( age, CHF, HTN, DM)  4. DM2: -Last Hb A1c, 6.3 -On home glipizide -Will start SSI for glucose control while inpatient    For questions or updates, please contact [facility] Please consult [email or web address] for contact info under Cardiology/STEMI.   SignedGeorgie Chard NP-C [facility] Pager: [phone] 08/14/2018 11:37 AM As above, patient seen and examined.  Briefly he is an 85 year old male with past medical history of nonischemic cardiomyopathy, prior ICD, diabetes mellitus, hypertension, paroxysmal atrial fibrillation, chronic stage III kidney disease with acute on chronic systolic congestive heart failure. Pt recently discharged following admission for acute heart failure.  Improved with diuretic.  Echocardiogram showed ejection fraction 20 to 25%; trace aortic insufficiency, mild mitral regurgitation and mild left atrial enlargement..  Patient states that he awoke this morning acutely short of breath.  No symptoms at bedtime last evening.  He denies chest pain, palpitations or syncope.  No recent weight gain or pedal edema.  Denies increased sodium or increased fluid intake.  Compliant with diuretic.  Cardiology now asked to evaluate. Electrocardiogram shows probable sinus rhythm at a rate of 98.  Nonspecific ST changes. Chest x-ray shows cardiac enlargement  and vascular congestion.  BUN 24 and creatinine 1.70.  BNP 497.  Hemoglobin 12.9.  1 acute on chronic systolic congestive heart failure-patient presents with dyspnea and worsening congestive heart failure.  We will treat with Lasix 40 mg IV twice daily.  He needs to continue with fluid restriction and low-sodium diet.  He has not gained weight and there is no pedal edema.  Question contribution from ischemia patient not diabetic.  We will arrange to scan nuclear study for risk stratification prior to discharge.  We will also arrange renal Dopplers to exclude renal artery stenosis as episodes occur abruptly.  Given abrupt onset also cycle enzymes.  2 paroxysmal atrial fibrillation-plan to continue amiodarone.  He is in sinus rhythm today. CHADSvasc 5.  Continue apixaban.    3 nonischemic cardiomyopathy-Continue beta-blocker, hydralazine and nitrates.  He is not on an ACE inhibitor or ARB given history of angioedema.  4 hypertension-continue preadmission medications and follow.  5 chronic stage III kidney disease-follow renal function closely with diuresis.  Olga Millers, MD

## 2018-08-14 NOTE — Progress Notes (Signed)
Switched patient to 2lpm [place]

## 2018-08-14 NOTE — Progress Notes (Addendum)
CRITICAL VALUE ALERT  Critical Value:  Troponin 0.07  Date & Time Notied:  08/14/2018 2152  Provider Notified: Dr Shirlee Latch Cardiologist on Call  Orders Received/Actions taken: Awaiting callback from provider No new orders at this time.

## 2018-08-14 NOTE — ED Notes (Signed)
Cardiology at bedside.

## 2018-08-14 NOTE — ED Notes (Signed)
Pt has [employer] pacemaker -

## 2018-08-14 NOTE — ED Provider Notes (Signed)
[name] EMERGENCY DEPARTMENT Provider Note   CSN: [id number] Arrival date & time: 08/14/18  4098     History   Chief Complaint Chief Complaint  Patient presents with  . Shortness of Breath    HPI Ryan Macdonald is a 84 y.o. male who presents in respiratory distress.   has a past medical history of Atrial fibrillation,Nonischemic Cardiomyopathy , CHF, Chronic kidney disease, Diabetes mellitus without complication, Gout, High cholesterol, urethral stricture, Hypertension, Kidney stones, Morbid obesity,Nonischemic cardiomyopathy Obesity, Osteoarthritis, PAF, PE and Renal insufficiency.There is a level 5 caveat due to acuity of condition.  History is gathered by EMS report and review of EMR.  According to the paramedic the patient called out for respiratory distress this morning around 6 AM.  He was in obvious distress upon their arrival with labored breathing however this appeared to rapidly worsen requiring him to be started on BiPAP.  His initial systolic pressure was 200.  He was given nitroglycerin prior to arrival.  EMS reports that the patient's pulse ox was 100%.  They auscultated rales. Review of EMR shows that the patient's last echocardiogram showed an EF of 20 to 25%.  He has an AICD in place.  EMS reports that they ran him on a previous call and he had nearly the same presentation as today. HPI  Past Medical History:  Diagnosis Date  . Atrial fibrillation (HCC)   . Cardiomyopathy (HCC)   . Cat scratch fever    removed mass on right side neck  . Cataracts, bilateral   . CHF (congestive heart failure) (HCC)   . Chronic kidney disease   . Corns and callosities   . Diabetes mellitus without complication (HCC)    TYPE 2  . Dyspnea   . Erythema intertrigo   . Flat foot   . Gout   . High cholesterol   . Hx of urethral stricture   . Hypertension   . Kidney stones   . Morbid obesity (HCC)   . Nonischemic cardiomyopathy (HCC)    a. ? 2009 Cath in [place] - nl  cors per pt;  b. 09/2013 Echo: EF 25-30%, sev diff HK.  . Obesity   . Osteoarthritis   . PAF (paroxysmal atrial fibrillation) (HCC)    a. post-op hip in 2014 - prev on xarelto.  . Renal insufficiency     Patient Active Problem List   Diagnosis Date Noted  . CHF (congestive heart failure) (HCC) 08/14/2018  . Acute on chronic combined systolic and diastolic congestive heart failure (HCC) 07/18/2018  . First degree AV block   . Shortness of breath 04/29/2018  . Acute HFrEF (heart failure with reduced ejection fraction) (HCC) 04/29/2018  . Acute respiratory failure with hypoxia (HCC) 09/27/2017  . CHF exacerbation (HCC) 09/27/2017  . Renal insufficiency   . Acute on chronic systolic CHF (congestive heart failure) (HCC) 07/08/2017  . Left knee pain 07/08/2017  . Acute bronchitis 05/10/2017  . Acute congestive heart failure (HCC) 04/14/2017  . Elevated troponin 04/14/2017  . Lactic acidosis   . Acute on chronic systolic heart failure, NYHA class 2 (HCC) 02/03/2017  . Diabetes mellitus type 2 in obese (HCC) 02/03/2017  . CAP (community acquired pneumonia) 02/03/2017  . Lobar pneumonia (HCC)   . Diabetes mellitus with complication (HCC)   . Acute on chronic respiratory failure with hypoxia (HCC)   . Acute on chronic combined systolic and diastolic heart failure (HCC) 05/19/2016  . Depression 05/19/2016  . Gout 05/19/2016  .  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 NSVT (nonsustained ventricular tachycardia) (HCC)   . Chronic renal insufficiency, stage 3 (moderate) (HCC)   . Pulmonary embolism (HCC)   . Acute respiratory failure (HCC)   . Osteoarthritis of left hip 10/25/2013  . Atrial fibrillation (HCC) 10/25/2013  . S/P hip replacement 10/24/2013  . Nonischemic cardiomyopathy (HCC)   . Obesity (BMI 30-39.9)   . Essential hypertension   . Hyperlipidemia     Past Surgical History:  Procedure Laterality Date  . CARDIAC CATHETERIZATION  1980's  .  CIRCUMCISION  1940's  . CYSTOSCOPY WITH URETHRAL DILATATION  10/23/2013  . CYSTOSCOPY WITH URETHRAL DILATATION N/A 10/23/2013   Procedure: CYSTOSCOPY WITH URETHRAL DILATATION;  Surgeon: Kathi Ludwig, MD;  Location: [facility] OR;  Service: Urology;  Laterality: N/A;  . INCISION AND DRAINAGE OF WOUND Right 1980's   "cat scratch" (10/23/2013)  . INGUINAL HERNIA REPAIR Right 1950's  . KIDNEY STONE SURGERY Right 1970's; 1983   "twice"  . TONSILLECTOMY AND ADENOIDECTOMY  1940's  . TOTAL HIP ARTHROPLASTY Left 10/23/2013   Procedure: TOTAL HIP ARTHROPLASTY;  Surgeon: Valeria Batman, MD;  Location: [facility] OR;  Service: Orthopedics;  Laterality: Left;        Home Medications    Prior to Admission medications   Medication Sig Start Date End Date Taking? Authorizing Provider  allopurinol (ZYLOPRIM) 100 MG tablet Take 200 mg by mouth daily.   Yes [provider]  amiodarone (PACERONE) 200 MG tablet Take 1 tablet (200 mg total) by mouth 2 (two) times daily. 02/16/18  Yes Santos-Sanchez, Chelsea Primus, MD  apixaban (ELIQUIS) 2.5 MG TABS tablet Take 1 tablet (2.5 mg total) by mouth 2 (two) times daily. 05/04/18  Yes Angelita Ingles, MD  carvedilol (COREG) 12.5 MG tablet Take 0.5 tablets (6.25 mg total) by mouth 2 (two) times daily with a meal. 04/15/17  Yes Osvaldo Shipper, MD  ciprofloxacin (CILOXAN) 0.3 % ophthalmic solution Place 1 drop into the left eye 4 (four) times daily. Administer 1 drop, every 2 hours, while awake, for 2 days. Then 1 drop, every 4 hours, while awake, for the next 5 days.   Yes [provider]  cyanocobalamin 1000 MCG tablet Take 1,000 mcg by mouth daily.    Yes [provider]  diclofenac sodium (VOLTAREN) 1 % GEL Apply 4 g topically 4 (four) times daily. Patient taking differently: Apply 4 g topically 4 (four) times daily as needed (pain).  07/10/17  Yes Valentino Nose, MD  docusate sodium (COLACE) 100 MG capsule Take 1 capsule (100 mg total) by mouth 2  (two) times daily as needed for mild constipation. 07/20/18  Yes Barrett, Joline Salt, PA-C  finasteride (PROSCAR) 5 MG tablet Take 5 mg by mouth daily as needed (unk).    Yes [provider]  glipiZIDE (GLUCOTROL) 5 MG tablet Take 5 mg by mouth daily before breakfast.   Yes [provider]  HYDROcodone-acetaminophen (NORCO/VICODIN) 5-325 MG tablet Take 2 tablets by mouth every 4 (four) hours as needed. Patient taking differently: Take 1 tablet by mouth 2 (two) times daily as needed for moderate pain.  04/09/16  Yes Isa Rankin, MD  HYDROXYZINE HCL PO Take 1 tablet by mouth at bedtime as needed (sleep).   Yes [provider]  ipratropium-albuterol (DUONEB) 0.5-2.5 (3) MG/3ML SOLN Take 3 mLs by nebulization every 4 (four) hours as needed. Patient taking differently: Take 3 mLs by nebulization every 4 (four) hours as needed (shortness of  breath).  05/14/17  Yes Pearson Grippe, MD  ketoconazole (NIZORAL) 2 % shampoo Apply 1 application topically 2 (two) times a week.   Yes [provider]  prednisoLONE acetate (PRED FORTE) 1 % ophthalmic suspension Place 1 drop into the left eye 4 (four) times daily.   Yes [provider]  PRESCRIPTION MEDICATION Apply 1 application topically daily as needed (pain). ThermaGel   Yes [provider]  sertraline (ZOLOFT) 25 MG tablet Take 25-50 mg by mouth daily as needed (for anxiety).    Yes [provider]  tamsulosin (FLOMAX) 0.4 MG CAPS capsule Take 0.4 mg by mouth at bedtime.   Yes [provider]  torsemide (DEMADEX) 20 MG tablet Take one 20mg  tablet daily.  However, if weight up 3lbs in 24 hours take an extra dose that day.  If weight is below your dry weight skip your dose that day. 07/20/18  Yes Barrett, Joline Salt, PA-C  fluticasone furoate-vilanterol (BREO ELLIPTA) 100-25 MCG/INH AEPB Inhale 1 puff into the lungs daily. Patient not taking: Reported on 08/14/2018 05/05/18   Angelita Ingles, MD    rosuvastatin (CRESTOR) 20 MG tablet Take 1 tablet (20 mg total) by mouth daily at 6 PM. Patient not taking: Reported on 08/14/2018 05/04/18   Angelita Ingles, MD    Family History Family History  Problem Relation Age of Onset  . Heart disease Mother   . Heart Problems Maternal Grandmother   . Other Unknown        negative for premature CAD    Social History Social History   Tobacco Use  . Smoking status: Former Smoker    Packs/day: 1.00    Years: 15.00    Pack years: 15.00    Types: Cigarettes, Cigars  . Smokeless tobacco: Former Neurosurgeon  . Tobacco comment: quit smoking ~ 50 yr ago  Substance Use Topics  . Alcohol use: No    Alcohol/week: 2.0 standard drinks    Types: 2 Cans of beer per week    Comment: rare beer.  . Drug use: No     Allergies   Lisinopril and Xarelto [rivaroxaban]   Review of Systems Review of Systems  Ten systems reviewed and are negative for acute change, except as noted in the HPI.   Physical Exam Updated Vital Signs BP (!) 142/91   Pulse 75   Temp 97.8 F (36.6 C) (Oral)   Resp 18   Ht 5\' 7"  (1.702 m)   Wt 112.9 kg   SpO2 96%   BMI 39.00 kg/m   Physical Exam  Constitutional: He appears well-developed and well-nourished. He appears distressed.  Arrives on Bipap  HENT:  Head: Normocephalic and atraumatic.  Eyes: Pupils are equal, round, and reactive to light. EOM are normal.  Neck: No tracheal deviation present.  Unable to assess JVD secondary to body habitus  Cardiovascular: Normal rate and regular rhythm.  Distant heart sounds, difficult to hear over rales  Pulmonary/Chest: He is in respiratory distress. He has no wheezes. He has rales.  Abdominal: Soft. He exhibits distension.  Musculoskeletal:       Right lower leg: He exhibits edema.       Left lower leg: He exhibits edema.  Equal trace edema BL LEs  Neurological: He is alert.  Skin: Skin is warm and dry.  Nursing note and vitals reviewed.    ED Treatments / Results   Labs (all labs ordered are listed, but only abnormal results are displayed) Labs Reviewed  BASIC METABOLIC PANEL - Abnormal; Notable for the following components:      Result Value   Glucose, Bld 253 (*)    Creatinine, Ser 1.69 (*)    GFR calc non Af Amer 36 (*)    GFR calc Af Amer 42 (*)    All other components within normal limits  CBC WITH DIFFERENTIAL/PLATELET - Abnormal; Notable for the following components:   RBC 4.00 (*)    Hemoglobin 12.3 (*)    All other components within normal limits  BRAIN NATRIURETIC PEPTIDE - Abnormal; Notable for the following components:   B Natriuretic Peptide 497.0 (*)    All other components within normal limits  GLUCOSE, CAPILLARY - Abnormal; Notable for the following components:   Glucose-Capillary 147 (*)    All other components within normal limits  I-STAT CHEM 8, ED - Abnormal; Notable for the following components:   BUN 24 (*)    Creatinine, Ser 1.70 (*)    Glucose, Bld 253 (*)    Hemoglobin 12.9 (*)    HCT 38.0 (*)    All other components within normal limits  TROPONIN I  TROPONIN I  HEMOGLOBIN A1C  I-STAT TROPONIN, ED    EKG EKG Interpretation  Date/Time:  Monday August 14 2018 07:01:46 EDT Ventricular Rate:  98 PR Interval:    QRS Duration: 116 QT Interval:  372 QTC Calculation: 473 R Axis:   64 Text Interpretation:  Atrial fibrillation Nonspecific intraventricular conduction delay Abnormal T, consider ischemia, diffuse leads Confirmed by Benjiman Core [phone]) on 08/14/2018 7:07:21 AM   Radiology Dg Chest Port 1 View  Result Date: 08/14/2018 CLINICAL DATA:  Respiratory distress EXAM: PORTABLE CHEST 1 VIEW COMPARISON:  07/31/2018 FINDINGS: Single chamber ICD lead into the right ventricle. There is cardiomegaly and aortic tortuosity. Cardiomediastinal contours are distorted by rightward rotation. Vascular congestion without Kerley lines or air bronchogram. No pneumothorax. IMPRESSION: Cardiomegaly and vascular  congestion. Electronically Signed   By: Marnee Spring M.D.   On: 08/14/2018 07:18    Procedures .Critical Care Performed by: Arthor Captain, PA-C Authorized by: Arthor Captain, PA-C   Critical care provider statement:    Critical care time (minutes):  45   Critical care was necessary to treat or prevent imminent or life-threatening deterioration of the following conditions:  Respiratory failure   Critical care was time spent personally by me on the following activities:  Discussions with consultants, evaluation of patient's response to treatment, examination of patient, ordering and performing treatments and interventions, ordering and review of laboratory studies, ordering and review of radiographic studies, pulse oximetry, re-evaluation of patient's condition, obtaining history from patient or surrogate and review of old charts   (including critical care time)  Medications Ordered in ED Medications  allopurinol (ZYLOPRIM) tablet 200 mg (has no administration in time range)  amiodarone (PACERONE) tablet 200 mg (has no administration in time range)  carvedilol (COREG) tablet 6.25 mg (has no administration in time range)  sertraline (ZOLOFT) tablet 25-50 mg (has no administration in time range)  docusate sodium (COLACE) capsule 100 mg (has no administration in time range)  apixaban (ELIQUIS) tablet 2.5 mg (has no administration in time range)  vitamin B-12 (CYANOCOBALAMIN) tablet 1,000 mcg (has no administration in time range)  rosuvastatin (CRESTOR) tablet 20 mg (has no administration in time range)  aspirin chewable tablet 324 mg (has no administration in time range)    Or  aspirin suppository 300 mg (has no administration in time range)  nitroGLYCERIN (NITROSTAT) SL  tablet 0.4 mg (has no administration in time range)  acetaminophen (TYLENOL) tablet 650 mg (has no administration in time range)  ondansetron (ZOFRAN) injection 4 mg (has no administration in time range)  insulin  aspart (novoLOG) injection 0-15 Units (has no administration in time range)  furosemide (LASIX) injection 40 mg (has no administration in time range)  guaiFENesin (MUCINEX) 12 hr tablet 600 mg (600 mg Oral Given 08/14/18 1410)  Influenza vac split quadrivalent PF (FLUZONE HIGH-DOSE) injection 0.5 mL (has no administration in time range)  prednisoLONE acetate (PRED FORTE) 1 % ophthalmic suspension 1 drop (has no administration in time range)  ciprofloxacin (CILOXAN) 0.3 % ophthalmic solution 1 drop (has no administration in time range)     Initial Impression / Assessment and Plan / ED Course  I have reviewed the triage vital signs and the nursing notes.  Pertinent labs & imaging results that were available during my care of the patient were reviewed by me and considered in my medical decision making (see chart for details).  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 He is still on Bipap. Patient Has not received NTG, Potassium. He was given NTG PTA by EMS . His pressures are not elevated currently.   [AH]  0804 Troponin i, poc: 0.04 [AH]  0805 Patient CXR shows Pulmonary edema   DG Chest Port 1 View [AH]  0805 EKG shows rate controlled AFib at 98 BPM.    [AH]    Clinical Course User Index [AH] Arthor Captain, PA-C    Patient seen by cardiology who will admit the patient.  He is improved significantly with out being given the Lasix or nitroglycerin drip.  Final Clinical Impressions(s) / ED Diagnoses   Final diagnoses:  Respiratory distress  Acute pulmonary edema [facility])    ED Discharge Orders    None       Arthor Captain, PA-C 08/14/18 1656    Benjiman Core, MD 08/15/18 2025

## 2018-08-15 DIAGNOSIS — R079 Chest pain, unspecified: Secondary | ICD-10-CM | POA: Diagnosis not present

## 2018-08-15 DIAGNOSIS — I255 Ischemic cardiomyopathy: Secondary | ICD-10-CM | POA: Diagnosis present

## 2018-08-15 DIAGNOSIS — Z87442 Personal history of urinary calculi: Secondary | ICD-10-CM | POA: Diagnosis not present

## 2018-08-15 DIAGNOSIS — E669 Obesity, unspecified: Secondary | ICD-10-CM | POA: Diagnosis present

## 2018-08-15 DIAGNOSIS — I2129 ST elevation (STEMI) myocardial infarction involving other sites: Secondary | ICD-10-CM | POA: Diagnosis present

## 2018-08-15 DIAGNOSIS — I480 Paroxysmal atrial fibrillation: Secondary | ICD-10-CM | POA: Diagnosis present

## 2018-08-15 DIAGNOSIS — Z6839 Body mass index (BMI) 39.0-39.9, adult: Secondary | ICD-10-CM | POA: Diagnosis not present

## 2018-08-15 DIAGNOSIS — I2119 ST elevation (STEMI) myocardial infarction involving other coronary artery of inferior wall: Secondary | ICD-10-CM | POA: Diagnosis present

## 2018-08-15 DIAGNOSIS — I5023 Acute on chronic systolic (congestive) heart failure: Secondary | ICD-10-CM | POA: Diagnosis not present

## 2018-08-15 DIAGNOSIS — R0602 Shortness of breath: Secondary | ICD-10-CM | POA: Diagnosis present

## 2018-08-15 DIAGNOSIS — Z8249 Family history of ischemic heart disease and other diseases of the circulatory system: Secondary | ICD-10-CM | POA: Diagnosis not present

## 2018-08-15 DIAGNOSIS — M2140 Flat foot [pes planus] (acquired), unspecified foot: Secondary | ICD-10-CM | POA: Diagnosis present

## 2018-08-15 DIAGNOSIS — Z95810 Presence of automatic (implantable) cardiac defibrillator: Secondary | ICD-10-CM | POA: Diagnosis not present

## 2018-08-15 DIAGNOSIS — Z888 Allergy status to other drugs, medicaments and biological substances status: Secondary | ICD-10-CM | POA: Diagnosis not present

## 2018-08-15 DIAGNOSIS — I080 Rheumatic disorders of both mitral and aortic valves: Secondary | ICD-10-CM | POA: Diagnosis present

## 2018-08-15 DIAGNOSIS — Z87891 Personal history of nicotine dependence: Secondary | ICD-10-CM | POA: Diagnosis not present

## 2018-08-15 DIAGNOSIS — E1122 Type 2 diabetes mellitus with diabetic chronic kidney disease: Secondary | ICD-10-CM | POA: Diagnosis present

## 2018-08-15 DIAGNOSIS — Z833 Family history of diabetes mellitus: Secondary | ICD-10-CM | POA: Diagnosis not present

## 2018-08-15 DIAGNOSIS — I160 Hypertensive urgency: Secondary | ICD-10-CM | POA: Diagnosis not present

## 2018-08-15 DIAGNOSIS — E785 Hyperlipidemia, unspecified: Secondary | ICD-10-CM | POA: Diagnosis present

## 2018-08-15 DIAGNOSIS — I130 Hypertensive heart and chronic kidney disease with heart failure and stage 1 through stage 4 chronic kidney disease, or unspecified chronic kidney disease: Secondary | ICD-10-CM | POA: Diagnosis present

## 2018-08-15 DIAGNOSIS — M109 Gout, unspecified: Secondary | ICD-10-CM | POA: Diagnosis present

## 2018-08-15 DIAGNOSIS — M1990 Unspecified osteoarthritis, unspecified site: Secondary | ICD-10-CM | POA: Diagnosis present

## 2018-08-15 DIAGNOSIS — I5043 Acute on chronic combined systolic (congestive) and diastolic (congestive) heart failure: Secondary | ICD-10-CM | POA: Diagnosis present

## 2018-08-15 DIAGNOSIS — Z23 Encounter for immunization: Secondary | ICD-10-CM | POA: Diagnosis not present

## 2018-08-15 DIAGNOSIS — Z7901 Long term (current) use of anticoagulants: Secondary | ICD-10-CM | POA: Diagnosis not present

## 2018-08-15 DIAGNOSIS — I2510 Atherosclerotic heart disease of native coronary artery without angina pectoris: Secondary | ICD-10-CM | POA: Diagnosis present

## 2018-08-15 DIAGNOSIS — N183 Chronic kidney disease, stage 3 (moderate): Secondary | ICD-10-CM | POA: Diagnosis present

## 2018-08-15 LAB — BASIC METABOLIC PANEL
ANION GAP: 12 (ref 5–15)
BUN: 21 mg/dL (ref 8–23)
CHLORIDE: 104 mmol/L (ref 98–111)
CO2: 27 mmol/L (ref 22–32)
Calcium: 8.8 mg/dL — ABNORMAL LOW (ref 8.9–10.3)
Creatinine, Ser: 1.63 mg/dL — ABNORMAL HIGH (ref 0.61–1.24)
GFR calc non Af Amer: 38 mL/min — ABNORMAL LOW (ref >60)
GFR, EST AFRICAN AMERICAN: 44 mL/min — AB (ref >60)
GLUCOSE: 121 mg/dL — AB (ref 70–99)
POTASSIUM: 3.7 mmol/L (ref 3.5–5.1)
Sodium: 143 mmol/L (ref 135–145)

## 2018-08-15 LAB — LIPID PANEL
CHOL/HDL RATIO: 5 ratio
Cholesterol: 159 mg/dL (ref 0–200)
HDL: 32 mg/dL — ABNORMAL LOW (ref >40)
LDL CALC: 109 mg/dL — AB (ref 0–99)
Triglycerides: 89 mg/dL (ref <150)
VLDL: 18 mg/dL (ref 0–40)

## 2018-08-15 LAB — GLUCOSE, CAPILLARY
GLUCOSE-CAPILLARY: 111 mg/dL — AB (ref 70–99)
GLUCOSE-CAPILLARY: 110 mg/dL — AB (ref 70–99)
Glucose-Capillary: 131 mg/dL — ABNORMAL HIGH (ref 70–99)
Glucose-Capillary: 129 mg/dL — ABNORMAL HIGH (ref 70–99)

## 2018-08-15 LAB — CBC
HEMATOCRIT: 33.3 % — AB (ref 39.0–52.0)
HEMOGLOBIN: 10.5 g/dL — AB (ref 13.0–17.0)
MCH: 30.9 pg (ref 26.0–34.0)
MCHC: 31.5 g/dL (ref 30.0–36.0)
MCV: 97.9 fL (ref 78.0–100.0)
Platelets: 211 10*3/uL (ref 150–400)
RBC: 3.4 MIL/uL — AB (ref 4.22–5.81)
RDW: 14.7 % (ref 11.5–15.5)
WBC: 6.9 10*3/uL (ref 4.0–10.5)

## 2018-08-15 LAB — TROPONIN I: TROPONIN I: 0.06 ng/mL — AB (ref <0.03)

## 2018-08-15 MED ORDER — ISOSORBIDE MONONITRATE ER 30 MG PO TB24
30.0000 mg | ORAL_TABLET | Freq: Every day | ORAL | Status: DC
  Administered 2018-08-15 – 2018-08-19 (×4): 30 mg via ORAL
  Filled 2018-08-15 (×4): qty 1

## 2018-08-15 MED ORDER — HYDRALAZINE HCL 10 MG PO TABS
10.0000 mg | ORAL_TABLET | Freq: Three times a day (TID) | ORAL | Status: DC
  Administered 2018-08-15 – 2018-08-19 (×12): 10 mg via ORAL
  Filled 2018-08-15 (×14): qty 1

## 2018-08-15 NOTE — Progress Notes (Signed)
Progress Note  Patient Name: Ryan Macdonald Date of Encounter: 08/15/2018  Primary Cardiologist: [name], MD   Subjective   No chest pain; dyspnea resolved  Inpatient Medications    Scheduled Meds: . allopurinol  200 mg Oral Daily  . amiodarone  200 mg Oral BID  . apixaban  2.5 mg Oral BID  . aspirin  324 mg Oral NOW   Or  . aspirin  300 mg Rectal NOW  . carvedilol  6.25 mg Oral BID WC  . ciprofloxacin  1 drop Left Eye QID  . furosemide  40 mg Intravenous BID  . Influenza vac split quadrivalent PF  0.5 mL Intramuscular Tomorrow-1000  . insulin aspart  0-15 Units Subcutaneous TID WC  . prednisoLONE acetate  1 drop Left Eye QID  . rosuvastatin  20 mg Oral q1800  . cyanocobalamin  1,000 mcg Oral Daily   Continuous Infusions:  PRN Meds: acetaminophen, docusate sodium, guaiFENesin, nitroGLYCERIN, ondansetron (ZOFRAN) IV, sertraline   Vital Signs    Vitals:   08/14/18 2112 08/15/18 0022 08/15/18 0532 08/15/18 0906  BP: 106/74 (!) 105/54 108/74 113/76  Pulse: 69 69 70 70  Resp:  20 19 20   Temp:  98 F (36.7 C) 98.9 F (37.2 C) 98.3 F (36.8 C)  TempSrc:  Oral Oral Oral  SpO2:  96% 98% 98%  Weight:   114.7 kg   Height:        Intake/Output Summary (Last 24 hours) at 08/15/2018 1019 Last data filed at 08/15/2018 0907 Gross per 24 hour  Intake 720 ml  Output 1500 ml  Net -780 ml   Filed Weights   08/14/18 0720 08/15/18 0532  Weight: 112.9 kg 114.7 kg    Telemetry    Sinus with intermittent V pacing- Personally Reviewed  Physical Exam   GEN: No acute distress.   Neck: No JVD Cardiac: RRR, no murmurs, rubs, or gallops.  Respiratory: Clear to auscultation bilaterally. GI: Soft, nontender, non-distended  MS: trace edema Neuro:  Nonfocal  Psych: Normal affect   Labs    Chemistry Recent Labs  Lab 08/14/18 0723 08/14/18 0740 08/15/18 0207  NA 140 142 143  K 4.0 4.0 3.7  CL 102 104 104  CO2 25  --  27  GLUCOSE 253* 253* 121*  BUN 19  24* 21  CREATININE 1.69* 1.70* 1.63*  CALCIUM 9.1  --  8.8*  GFRNONAA 36*  --  38*  GFRAA 42*  --  44*  ANIONGAP 13  --  12     Hematology Recent Labs  Lab 08/14/18 0723 08/14/18 0740 08/15/18 0207  WBC 9.3  --  6.9  RBC 4.00*  --  3.40*  HGB 12.3* 12.9* 10.5*  HCT 39.3 38.0* 33.3*  MCV 98.3  --  97.9  MCH 30.8  --  30.9  MCHC 31.3  --  31.5  RDW 14.7  --  14.7  PLT 250  --  211    Cardiac Enzymes Recent Labs  Lab 08/14/18 1937 08/15/18 0207  TROPONINI 0.07* 0.06*    Recent Labs  Lab 08/14/18 0732  TROPIPOC 0.04     BNP Recent Labs  Lab 08/14/18 0723  BNP 497.0*     Radiology    Dg Chest Port 1 View  Result Date: 08/14/2018 CLINICAL DATA:  Respiratory distress EXAM: PORTABLE CHEST 1 VIEW COMPARISON:  07/31/2018 FINDINGS: Single chamber ICD lead into the right ventricle. There is cardiomegaly and aortic tortuosity. Cardiomediastinal contours are distorted by  rightward rotation. Vascular congestion without Kerley lines or air bronchogram. No pneumothorax. IMPRESSION: Cardiomegaly and vascular congestion. Electronically Signed   By: Marnee Spring M.D.   On: 08/14/2018 07:18    Patient Profile     84 year old male with past medical history of nonischemic cardiomyopathy, prior ICD, diabetes mellitus, hypertension, paroxysmal atrial fibrillation, chronic stage III kidney disease with acute on chronic systolic congestive heart failure. Echocardiogram 6/19 showed ejection fraction 20 to 25%; trace aortic insufficiency, mild mitral regurgitation and mild left atrial enlargement..    Assessment & Plan    1 acute on chronic systolic congestive heart failure-failure status improving.  I/O -1520.  Continue present dose of Lasix.  Continue fluid restriction and low-sodium diet.  He will need to continue to weigh daily at home and if he gains 2 to 3 pounds take an additional 40 mg of Lasix.  We are awaiting renal Dopplers to exclude renal artery stenosis.  Given acute  nature of edema I will arrange a stress nuclear study tomorrow to exclude ischemia.  Enzymes are not consistent with acute coronary syndrome.   2 paroxysmal atrial fibrillation-continue amiodarone and apixaban.  3 nonischemic cardiomyopathy-Add hydralazine 10 mg po TID and imdur 30 mg daily; titrate as tolerated. No ACE inhibitor or ARB given history of angioedema.  4 hypertension-blood pressure controlled.  Continue present medications.  5 chronic stage III kidney disease-follow renal function closely with diuresis.  For questions or updates, please contact [facility] Please consult [email or web address] for contact info under        Signed, Olga Millers, MD  08/15/2018, 10:19 AM

## 2018-08-15 NOTE — Discharge Instructions (Signed)

## 2018-08-16 ENCOUNTER — Inpatient Hospital Stay (HOSPITAL_COMMUNITY): Payer: No Typology Code available for payment source

## 2018-08-16 DIAGNOSIS — I160 Hypertensive urgency: Secondary | ICD-10-CM

## 2018-08-16 DIAGNOSIS — R079 Chest pain, unspecified: Secondary | ICD-10-CM

## 2018-08-16 LAB — BASIC METABOLIC PANEL
ANION GAP: 9 (ref 5–15)
BUN: 21 mg/dL (ref 8–23)
CALCIUM: 8.8 mg/dL — AB (ref 8.9–10.3)
CO2: 31 mmol/L (ref 22–32)
Chloride: 102 mmol/L (ref 98–111)
Creatinine, Ser: 1.51 mg/dL — ABNORMAL HIGH (ref 0.61–1.24)
GFR calc Af Amer: 48 mL/min — ABNORMAL LOW (ref >60)
GFR, EST NON AFRICAN AMERICAN: 41 mL/min — AB (ref >60)
GLUCOSE: 113 mg/dL — AB (ref 70–99)
Potassium: 3.6 mmol/L (ref 3.5–5.1)
Sodium: 142 mmol/L (ref 135–145)

## 2018-08-16 LAB — HEMOGLOBIN A1C
HEMOGLOBIN A1C: 6 % — AB (ref 4.8–5.6)
MEAN PLASMA GLUCOSE: 126 mg/dL

## 2018-08-16 LAB — GLUCOSE, CAPILLARY
GLUCOSE-CAPILLARY: 117 mg/dL — AB (ref 70–99)
GLUCOSE-CAPILLARY: 114 mg/dL — AB (ref 70–99)
Glucose-Capillary: 136 mg/dL — ABNORMAL HIGH (ref 70–99)
Glucose-Capillary: 129 mg/dL — ABNORMAL HIGH (ref 70–99)

## 2018-08-16 LAB — NM MYOCAR MULTI W/SPECT W/WALL MOTION / EF
Peak HR: 82 {beats}/min
Rest HR: 70 {beats}/min

## 2018-08-16 MED ORDER — REGADENOSON 0.4 MG/5ML IV SOLN
0.4000 mg | Freq: Once | INTRAVENOUS | Status: AC
  Administered 2018-08-16: 0.4 mg via INTRAVENOUS

## 2018-08-16 MED ORDER — REGADENOSON 0.4 MG/5ML IV SOLN
INTRAVENOUS | Status: AC
  Filled 2018-08-16: qty 5

## 2018-08-16 MED ORDER — TECHNETIUM TC 99M TETROFOSMIN IV KIT
30.0000 | PACK | Freq: Once | INTRAVENOUS | Status: AC | PRN
  Administered 2018-08-16: 30 via INTRAVENOUS

## 2018-08-16 MED ORDER — TECHNETIUM TC 99M TETROFOSMIN IV KIT
10.0000 | PACK | Freq: Once | INTRAVENOUS | Status: AC | PRN
  Administered 2018-08-16: 10 via INTRAVENOUS

## 2018-08-16 NOTE — Progress Notes (Signed)
    Patient presented for Lexiscan nuclear stress test. Tolerated procedure well. Pending final stress imaging result.  Berton Bon, AGNP-C 08/16/2018  12:19 PM Pager: [phone]

## 2018-08-16 NOTE — Progress Notes (Signed)
    Nuclear stress test is abnormal but may be consistent with his prior cardiac disease. He is not having any chest pain at all. I will make him NPO after midnight and have Dr. Jens Som review that study in the morning.   Berton Bon, AGNP-C [facility] 08/16/2018  4:41 PM Pager: [phone]

## 2018-08-16 NOTE — Progress Notes (Signed)
*  PRELIMINARY RESULTS* Vascular Ultrasound Renal Artery Duplex has been completed. Study was technically limited due to patient body habitus. Due to technical limitations, unable to adequately evaluate for right renal artery stenosis. There is no obvious evidence of renal artery stenosis involving visualized segments of the left renal artery.  08/16/2018 10:23 AM Gertie Fey, MHA, RVT, RDCS, RDMS

## 2018-08-16 NOTE — Progress Notes (Signed)
To the best of my knowledge, documentation by G Wilson [employer] nursing student is correct. 

## 2018-08-16 NOTE — Progress Notes (Signed)
Progress Note  Patient Name: Ryan Macdonald Date of Encounter: 08/16/2018  Primary Cardiologist: [name], MD   Subjective   Denies CP or dyspnea  Inpatient Medications    Scheduled Meds: . allopurinol  200 mg Oral Daily  . amiodarone  200 mg Oral BID  . apixaban  2.5 mg Oral BID  . carvedilol  6.25 mg Oral BID WC  . ciprofloxacin  1 drop Left Eye QID  . furosemide  40 mg Intravenous BID  . hydrALAZINE  10 mg Oral Q8H  . insulin aspart  0-15 Units Subcutaneous TID WC  . isosorbide mononitrate  30 mg Oral Daily  . prednisoLONE acetate  1 drop Left Eye QID  . rosuvastatin  20 mg Oral q1800  . cyanocobalamin  1,000 mcg Oral Daily   Continuous Infusions:  PRN Meds: acetaminophen, docusate sodium, guaiFENesin, nitroGLYCERIN, ondansetron (ZOFRAN) IV, sertraline   Vital Signs    Vitals:   08/15/18 0906 08/15/18 1542 08/15/18 1958 08/16/18 0638  BP: 113/76 109/75 109/62 (!) 100/59  Pulse: 70 71 70 70  Resp: 20 20 18 17   Temp: 98.3 F (36.8 C) 98.3 F (36.8 C) 98.2 F (36.8 C) 98.6 F (37 C)  TempSrc: Oral Oral Oral   SpO2: 98% 98% 95% 96%  Weight:    112.2 kg  Height:        Intake/Output Summary (Last 24 hours) at 08/16/2018 0728 Last data filed at 08/16/2018 0641 Gross per 24 hour  Intake 900 ml  Output 2375 ml  Net -1475 ml   Filed Weights   08/14/18 0720 08/15/18 0532 08/16/18 5300  Weight: 112.9 kg 114.7 kg 112.2 kg    Telemetry    Sinus with intermittent V pacing- Personally Reviewed  Physical Exam   GEN: WD obese NAD Neck: No JVD, supple Cardiac: RRR Respiratory: CTA GI: Soft, NT/ND MS: Trace edema Neuro:  Grossly intact   Labs    Chemistry Recent Labs  Lab 08/14/18 0723 08/14/18 0740 08/15/18 0207  NA 140 142 143  K 4.0 4.0 3.7  CL 102 104 104  CO2 25  --  27  GLUCOSE 253* 253* 121*  BUN 19 24* 21  CREATININE 1.69* 1.70* 1.63*  CALCIUM 9.1  --  8.8*  GFRNONAA 36*  --  38*  GFRAA 42*  --  44*  ANIONGAP 13  --  12       Hematology Recent Labs  Lab 08/14/18 0723 08/14/18 0740 08/15/18 0207  WBC 9.3  --  6.9  RBC 4.00*  --  3.40*  HGB 12.3* 12.9* 10.5*  HCT 39.3 38.0* 33.3*  MCV 98.3  --  97.9  MCH 30.8  --  30.9  MCHC 31.3  --  31.5  RDW 14.7  --  14.7  PLT 250  --  211    Cardiac Enzymes Recent Labs  Lab 08/14/18 1937 08/15/18 0207  TROPONINI 0.07* 0.06*    Recent Labs  Lab 08/14/18 0732  TROPIPOC 0.04     BNP Recent Labs  Lab 08/14/18 0723  BNP 497.0*     Patient Profile     84 year old male with past medical history of nonischemic cardiomyopathy, prior ICD, diabetes mellitus, hypertension, paroxysmal atrial fibrillation, chronic stage III kidney disease with acute on chronic systolic congestive heart failure. Echocardiogram 6/19 showed ejection fraction 20 to 25%; trace aortic insufficiency, mild mitral regurgitation and mild left atrial enlargement..    Assessment & Plan    1 acute on chronic  systolic congestive heart failure-wt 247.4. I/O -[id number].  Continue present dose of Lasix.  Continue fluid restriction and low-sodium diet.  He will need to continue to weigh daily at home and if he gains 2 to 3 pounds take an additional 40 mg of Lasix.  BMET pending this AM.  Patient is for nuclear study this a.m. to screen for ischemia as episode was relatively sudden in onset.  We have also ordered renal Dopplers to exclude RAS that are pending.  2 paroxysmal atrial fibrillation-continue amiodarone and apixaban.  3 nonischemic cardiomyopathy-blood pressure is borderline.  Will continue present dose of carvedilol, low-dose hydralazine and nitrates.  No ACE inhibitor, ARB or Entresto given history of angioedema.  4 hypertension-as outlined above blood pressure is controlled.  Continue present medications.  5 chronic stage III kidney disease-follow renal function closely with diuresis.  For questions or updates, please contact [facility] Please consult [email or web address] for  contact info under        Signed, Olga Millers, MD  08/16/2018, 7:28 AM

## 2018-08-17 LAB — GLUCOSE, CAPILLARY
GLUCOSE-CAPILLARY: 139 mg/dL — AB (ref 70–99)
Glucose-Capillary: 127 mg/dL — ABNORMAL HIGH (ref 70–99)
Glucose-Capillary: 128 mg/dL — ABNORMAL HIGH (ref 70–99)
Glucose-Capillary: 94 mg/dL (ref 70–99)

## 2018-08-17 LAB — BASIC METABOLIC PANEL
ANION GAP: 10 (ref 5–15)
BUN: 22 mg/dL (ref 8–23)
CALCIUM: 8.9 mg/dL (ref 8.9–10.3)
CO2: 30 mmol/L (ref 22–32)
Chloride: 102 mmol/L (ref 98–111)
Creatinine, Ser: 1.6 mg/dL — ABNORMAL HIGH (ref 0.61–1.24)
GFR, EST AFRICAN AMERICAN: 45 mL/min — AB (ref >60)
GFR, EST NON AFRICAN AMERICAN: 38 mL/min — AB (ref >60)
Glucose, Bld: 105 mg/dL — ABNORMAL HIGH (ref 70–99)
Potassium: 3.7 mmol/L (ref 3.5–5.1)
Sodium: 142 mmol/L (ref 135–145)

## 2018-08-17 MED ORDER — ASPIRIN EC 81 MG PO TBEC
81.0000 mg | DELAYED_RELEASE_TABLET | Freq: Every day | ORAL | Status: DC
  Administered 2018-08-17 – 2018-08-19 (×3): 81 mg via ORAL
  Filled 2018-08-17 (×3): qty 1

## 2018-08-17 NOTE — Progress Notes (Signed)
Patient's BP was soft this morning 96/71. MD was paged and gave orders to hold Hydralazine this morning. Will continue to monitor.   Elsie Lincoln, RN

## 2018-08-17 NOTE — Progress Notes (Signed)
Progress Note  Patient Name: Ryan Macdonald Date of Encounter: 08/17/2018  Primary Cardiologist: [name], MD   Subjective   No CP or dyspnea  Inpatient Medications    Scheduled Meds: . allopurinol  200 mg Oral Daily  . amiodarone  200 mg Oral BID  . apixaban  2.5 mg Oral BID  . carvedilol  6.25 mg Oral BID WC  . ciprofloxacin  1 drop Left Eye QID  . furosemide  40 mg Intravenous BID  . hydrALAZINE  10 mg Oral Q8H  . insulin aspart  0-15 Units Subcutaneous TID WC  . isosorbide mononitrate  30 mg Oral Daily  . prednisoLONE acetate  1 drop Left Eye QID  . rosuvastatin  20 mg Oral q1800  . cyanocobalamin  1,000 mcg Oral Daily   Continuous Infusions:  PRN Meds: acetaminophen, docusate sodium, guaiFENesin, nitroGLYCERIN, ondansetron (ZOFRAN) IV, sertraline   Vital Signs    Vitals:   08/16/18 1956 08/17/18 0552 08/17/18 0616 08/17/18 0800  BP: 111/72 96/71 (!) 110/59 117/64  Pulse: 70 70 70 67  Resp: 18 16 16 18   Temp: 98.1 F (36.7 C) 98.1 F (36.7 C) 97.9 F (36.6 C) 98.6 F (37 C)  TempSrc: Oral Oral Oral Oral  SpO2: 97% 98% 99% 98%  Weight:   110.9 kg   Height:        Intake/Output Summary (Last 24 hours) at 08/17/2018 0924 Last data filed at 08/17/2018 0800 Gross per 24 hour  Intake 520 ml  Output 1725 ml  Net -1205 ml   Filed Weights   08/15/18 0532 08/16/18 0638 08/17/18 0616  Weight: 114.7 kg 112.2 kg 110.9 kg    Telemetry    Sinus with intermittent V pacing- Personally Reviewed  Physical Exam   GEN: WD NAD Neck: No JVD Cardiac: RRR Respiratory: CTA; no wheeze GI: Soft, NT/ND, no masses MS: Noe edema Neuro:  No focal findings   Labs    Chemistry Recent Labs  Lab 08/15/18 0207 08/16/18 0611 08/17/18 0525  NA 143 142 142  K 3.7 3.6 3.7  CL 104 102 102  CO2 27 31 30   GLUCOSE 121* 113* 105*  BUN 21 21 22   CREATININE 1.63* 1.51* 1.60*  CALCIUM 8.8* 8.8* 8.9  GFRNONAA 38* 41* 38*  GFRAA 44* 48* 45*  ANIONGAP 12 9 10        Hematology Recent Labs  Lab 08/14/18 0723 08/14/18 0740 08/15/18 0207  WBC 9.3  --  6.9  RBC 4.00*  --  3.40*  HGB 12.3* 12.9* 10.5*  HCT 39.3 38.0* 33.3*  MCV 98.3  --  97.9  MCH 30.8  --  30.9  MCHC 31.3  --  31.5  RDW 14.7  --  14.7  PLT 250  --  211    Cardiac Enzymes Recent Labs  Lab 08/14/18 1937 08/15/18 0207  TROPONINI 0.07* 0.06*    Recent Labs  Lab 08/14/18 0732  TROPIPOC 0.04     BNP Recent Labs  Lab 08/14/18 0723  BNP 497.0*     Patient Profile     84 year old male with past medical history of nonischemic cardiomyopathy, prior ICD, diabetes mellitus, hypertension, paroxysmal atrial fibrillation, chronic stage III kidney disease with acute on chronic systolic congestive heart failure. Echocardiogram 6/19 showed ejection fraction 20 to 25%; trace aortic insufficiency, mild mitral regurgitation and mild left atrial enlargement..    Assessment & Plan    1 acute on chronic systolic congestive heart failure-wt 244. I/O -1355.  Patient is improving.  Nuclear study is abnormal with ejection fraction 23%, prior inferior lateral infarct and ischemia in the distal anteroseptal and apical walls.  I am concerned that ischemia could be contributing to his presentations and also to his ischemic cardiomyopathy.  We will hold Lasix and proceed with cardiac catheterization tomorrow.  The risks and benefits including myocardial infarction, CVA and death discussed and he agrees to proceed.  We also discussed risk of contrast nephropathy.  No ventriculogram and limit dye.  Also note renal Dopplers do not suggest renal artery stenosis.  Add aspirin 81 mg daily prior to catheterization.  2 paroxysmal atrial fibrillation-continue amiodarone; hold apixaban in anticipation of catheterization.  3 nonischemic cardiomyopathy-as outlined above nuclear study significantly abnormal.  Question of coronary disease has contributed to his cardiomyopathy.  Plan catheterization to  further assess. Will continue present dose of carvedilol, low-dose hydralazine and nitrates.  No ACE inhibitor, ARB or Entresto given history of angioedema.  4 hypertension-blood pressure controlled.  Continue present medications.  5 chronic stage III kidney disease-hold diuretics prior to catheterization.  Follow renal function afterwards.  For questions or updates, please contact [facility] Please consult [email or web address] for contact info under        Signed, Olga Millers, MD  08/17/2018, 9:24 AM

## 2018-08-18 ENCOUNTER — Encounter (HOSPITAL_COMMUNITY): Payer: Self-pay | Admitting: Interventional Cardiology

## 2018-08-18 ENCOUNTER — Inpatient Hospital Stay (HOSPITAL_COMMUNITY)
Admission: EM | Disposition: A | Discharge status: Home / Self Care | Payer: Self-pay | Source: Home / Self Care | Attending: Cardiology

## 2018-08-18 DIAGNOSIS — I2510 Atherosclerotic heart disease of native coronary artery without angina pectoris: Secondary | ICD-10-CM

## 2018-08-18 DIAGNOSIS — I5023 Acute on chronic systolic (congestive) heart failure: Secondary | ICD-10-CM

## 2018-08-18 HISTORY — PX: CORONARY STENT INTERVENTION: CATH118234

## 2018-08-18 HISTORY — PX: INTRAVASCULAR ULTRASOUND/IVUS: CATH118244

## 2018-08-18 HISTORY — PX: LEFT HEART CATH AND CORONARY ANGIOGRAPHY: CATH118249

## 2018-08-18 LAB — BASIC METABOLIC PANEL
Anion gap: 14 (ref 5–15)
BUN: 24 mg/dL — ABNORMAL HIGH (ref 8–23)
CHLORIDE: 98 mmol/L (ref 98–111)
CO2: 30 mmol/L (ref 22–32)
CREATININE: 1.55 mg/dL — AB (ref 0.61–1.24)
Calcium: 9.2 mg/dL (ref 8.9–10.3)
GFR calc non Af Amer: 40 mL/min — ABNORMAL LOW (ref >60)
GFR, EST AFRICAN AMERICAN: 46 mL/min — AB (ref >60)
Glucose, Bld: 128 mg/dL — ABNORMAL HIGH (ref 70–99)
POTASSIUM: 3.9 mmol/L (ref 3.5–5.1)
Sodium: 142 mmol/L (ref 135–145)

## 2018-08-18 LAB — GLUCOSE, CAPILLARY
GLUCOSE-CAPILLARY: 100 mg/dL — AB (ref 70–99)
Glucose-Capillary: 122 mg/dL — ABNORMAL HIGH (ref 70–99)
Glucose-Capillary: 122 mg/dL — ABNORMAL HIGH (ref 70–99)

## 2018-08-18 LAB — POCT ACTIVATED CLOTTING TIME
ACTIVATED CLOTTING TIME: 290 s
ACTIVATED CLOTTING TIME: 428 s
Activated Clotting Time: 290 seconds
Activated Clotting Time: 312 seconds
Activated Clotting Time: 351 seconds

## 2018-08-18 SURGERY — LEFT HEART CATH AND CORONARY ANGIOGRAPHY
Anesthesia: LOCAL

## 2018-08-18 MED ORDER — TIROFIBAN HCL IV 12.5 MG/250 ML
INTRAVENOUS | Status: AC
  Filled 2018-08-18: qty 250

## 2018-08-18 MED ORDER — ACETAMINOPHEN 325 MG PO TABS: 650.0000 mg | ORAL_TABLET | ORAL | Status: DC | PRN

## 2018-08-18 MED ORDER — HYDRALAZINE HCL 20 MG/ML IJ SOLN: 5.0000 mg | INTRAMUSCULAR | Status: AC | PRN

## 2018-08-18 MED ORDER — ASPIRIN 81 MG PO CHEW
81.0000 mg | CHEWABLE_TABLET | ORAL | Status: AC
  Administered 2018-08-18: 81 mg via ORAL
  Filled 2018-08-18: qty 1

## 2018-08-18 MED ORDER — TICAGRELOR 90 MG PO TABS
ORAL_TABLET | ORAL | Status: DC | PRN
  Administered 2018-08-18: 180 mg via ORAL

## 2018-08-18 MED ORDER — SODIUM CHLORIDE 0.9% FLUSH: 3.0000 mL | INTRAVENOUS | Status: DC | PRN

## 2018-08-18 MED ORDER — FENTANYL CITRATE (PF) 100 MCG/2ML IJ SOLN
INTRAMUSCULAR | Status: AC
  Filled 2018-08-18: qty 2

## 2018-08-18 MED ORDER — CLOPIDOGREL BISULFATE 75 MG PO TABS
300.0000 mg | ORAL_TABLET | Freq: Once | ORAL | Status: AC
  Administered 2018-08-18: 300 mg via ORAL
  Filled 2018-08-18: qty 4

## 2018-08-18 MED ORDER — CLOPIDOGREL BISULFATE 75 MG PO TABS
75.0000 mg | ORAL_TABLET | Freq: Every day | ORAL | Status: DC
  Administered 2018-08-19: 75 mg via ORAL
  Filled 2018-08-18: qty 1

## 2018-08-18 MED ORDER — SODIUM CHLORIDE 0.9 % IV SOLN
INTRAVENOUS | Status: DC
  Administered 2018-08-18: 14:00:00 via INTRAVENOUS

## 2018-08-18 MED ORDER — DICLOFENAC SODIUM 1 % TD GEL
4.0000 g | Freq: Four times a day (QID) | TRANSDERMAL | Status: DC
  Administered 2018-08-18 – 2018-08-19 (×4): 4 g via TOPICAL
  Filled 2018-08-18 (×2): qty 100

## 2018-08-18 MED ORDER — LIDOCAINE HCL (PF) 1 % IJ SOLN
INTRAMUSCULAR | Status: DC | PRN
  Administered 2018-08-18: 2 mL via INTRADERMAL

## 2018-08-18 MED ORDER — SODIUM CHLORIDE 0.9 % IV SOLN: INTRAVENOUS | Status: AC

## 2018-08-18 MED ORDER — TIROFIBAN (AGGRASTAT) BOLUS VIA INFUSION
INTRAVENOUS | Status: DC | PRN
  Administered 2018-08-18: 2765 ug via INTRAVENOUS

## 2018-08-18 MED ORDER — ASPIRIN 81 MG PO CHEW: 81.0000 mg | CHEWABLE_TABLET | Freq: Every day | ORAL | Status: DC

## 2018-08-18 MED ORDER — IOHEXOL 350 MG/ML SOLN
INTRAVENOUS | Status: DC | PRN
  Administered 2018-08-18: 220 mL via INTRAVENOUS

## 2018-08-18 MED ORDER — SODIUM CHLORIDE 0.9 % IV SOLN: 250.0000 mL | INTRAVENOUS | Status: DC | PRN

## 2018-08-18 MED ORDER — FENTANYL CITRATE (PF) 100 MCG/2ML IJ SOLN
INTRAMUSCULAR | Status: DC | PRN
  Administered 2018-08-18: 25 ug via INTRAVENOUS

## 2018-08-18 MED ORDER — ONDANSETRON HCL 4 MG/2ML IJ SOLN: 4.0000 mg | Freq: Four times a day (QID) | INTRAMUSCULAR | Status: DC | PRN

## 2018-08-18 MED ORDER — SODIUM CHLORIDE 0.9% FLUSH
3.0000 mL | Freq: Two times a day (BID) | INTRAVENOUS | Status: DC
  Administered 2018-08-18 – 2018-08-19 (×2): 3 mL via INTRAVENOUS

## 2018-08-18 MED ORDER — MIDAZOLAM HCL 2 MG/2ML IJ SOLN
INTRAMUSCULAR | Status: AC
  Filled 2018-08-18: qty 2

## 2018-08-18 MED ORDER — HEPARIN SODIUM (PORCINE) 1000 UNIT/ML IJ SOLN
INTRAMUSCULAR | Status: AC
  Filled 2018-08-18: qty 1

## 2018-08-18 MED ORDER — TICAGRELOR 90 MG PO TABS
ORAL_TABLET | ORAL | Status: AC
  Filled 2018-08-18: qty 2

## 2018-08-18 MED ORDER — LABETALOL HCL 5 MG/ML IV SOLN: 10.0000 mg | INTRAVENOUS | Status: AC | PRN

## 2018-08-18 MED ORDER — VERAPAMIL HCL 2.5 MG/ML IV SOLN
INTRAVENOUS | Status: DC | PRN
  Administered 2018-08-18: 10 mL via INTRA_ARTERIAL

## 2018-08-18 MED ORDER — ASPIRIN 81 MG PO CHEW: 81.0000 mg | CHEWABLE_TABLET | ORAL | Status: DC

## 2018-08-18 MED ORDER — MIDAZOLAM HCL 2 MG/2ML IJ SOLN
INTRAMUSCULAR | Status: DC | PRN
  Administered 2018-08-18: 1 mg via INTRAVENOUS

## 2018-08-18 MED ORDER — SODIUM CHLORIDE 0.9 % IV SOLN: INTRAVENOUS | Status: DC

## 2018-08-18 MED ORDER — LIDOCAINE HCL (PF) 1 % IJ SOLN
INTRAMUSCULAR | Status: AC
  Filled 2018-08-18: qty 30

## 2018-08-18 MED ORDER — HEPARIN SODIUM (PORCINE) 1000 UNIT/ML IJ SOLN
INTRAMUSCULAR | Status: DC | PRN
  Administered 2018-08-18: 5500 [IU] via INTRAVENOUS
  Administered 2018-08-18: 4000 [IU] via INTRAVENOUS
  Administered 2018-08-18: 3000 [IU] via INTRAVENOUS
  Administered 2018-08-18: 6000 [IU] via INTRAVENOUS
  Administered 2018-08-18: 3000 [IU] via INTRAVENOUS
  Administered 2018-08-18: 4000 [IU] via INTRAVENOUS

## 2018-08-18 MED ORDER — VERAPAMIL HCL 2.5 MG/ML IV SOLN
INTRAVENOUS | Status: AC
  Filled 2018-08-18: qty 2

## 2018-08-18 MED ORDER — SODIUM CHLORIDE 0.9% FLUSH
3.0000 mL | Freq: Two times a day (BID) | INTRAVENOUS | Status: DC
  Administered 2018-08-18: 3 mL via INTRAVENOUS

## 2018-08-18 MED ORDER — HEPARIN (PORCINE) IN NACL 1000-0.9 UT/500ML-% IV SOLN
INTRAVENOUS | Status: DC | PRN
  Administered 2018-08-18 (×2): 500 mL

## 2018-08-18 MED ORDER — HEPARIN (PORCINE) IN NACL 1000-0.9 UT/500ML-% IV SOLN
INTRAVENOUS | Status: AC
  Filled 2018-08-18: qty 1000

## 2018-08-18 SURGICAL SUPPLY — 27 items
BALLN EMERGE MR 2.5X15 (BALLOONS) ×2
BALLN WOLVERINE 2.00X10 (BALLOONS) ×2
BALLN WOLVERINE 2.75X6 (BALLOONS) ×2
BALLN ~~LOC~~ EMERGE MR 3.25X8 (BALLOONS) ×2
BALLOON EMERGE MR 2.5X15 (BALLOONS) ×1 IMPLANT
BALLOON WOLVERINE 2.00X10 (BALLOONS) ×1 IMPLANT
BALLOON WOLVERINE 2.75X6 (BALLOONS) ×1 IMPLANT
BALLOON ~~LOC~~ EMERGE MR 3.25X8 (BALLOONS) ×1 IMPLANT
CATH INFINITI 5 FR JL3.5 (CATHETERS) ×2 IMPLANT
CATH INFINITI JR4 5F (CATHETERS) ×2 IMPLANT
CATH LAUNCHER 6FR EBU 3 (CATHETERS) ×4 IMPLANT
CATH OPTICROSS 40MHZ (CATHETERS) ×2 IMPLANT
DEVICE RAD COMP TR BAND LRG (VASCULAR PRODUCTS) ×2 IMPLANT
GLIDESHEATH SLEND SS 6F .021 (SHEATH) ×2 IMPLANT
GUIDEWIRE INQWIRE 1.5J.035X260 (WIRE) ×1 IMPLANT
INQWIRE 1.5J .035X260CM (WIRE) ×2
KIT ENCORE 26 ADVANTAGE (KITS) ×2 IMPLANT
KIT HEART LEFT (KITS) ×2 IMPLANT
KIT HEMO VALVE WATCHDOG (MISCELLANEOUS) ×2 IMPLANT
PACK CARDIAC CATHETERIZATION (CUSTOM PROCEDURE TRAY) ×2 IMPLANT
SHEATH PROBE COVER 6X72 (BAG) ×2 IMPLANT
SLED PULL BACK IVUS (MISCELLANEOUS) ×2 IMPLANT
STENT SYNERGY DES 3X12 (Permanent Stent) ×2 IMPLANT
TRANSDUCER W/STOPCOCK (MISCELLANEOUS) ×2 IMPLANT
TUBING CIL FLEX 10 FLL-RA (TUBING) ×2 IMPLANT
WIRE ASAHI PROWATER 180CM (WIRE) ×2 IMPLANT
WIRE MARVEL STR TIP 190CM (WIRE) ×2 IMPLANT

## 2018-08-18 NOTE — Progress Notes (Signed)
Pt having hematoma above radial site. Pressure held, hematoma softened. Wrapped with coban- Cont to monitor. Emelda Brothers RN

## 2018-08-18 NOTE — Progress Notes (Addendum)
Pt reports sob getting worse. He reports that he is in ED often with needs for lasix and Bipap. Pt requested bipap. Lung sounds clear other than crackles in L lower base. Bhagat notified. No new orders given. Will continue to monitor.

## 2018-08-18 NOTE — Progress Notes (Signed)
Patient reports mild SOB after catheterization. Patient placed on 2L O2 [place], Dr. Eldridge Dace notified.

## 2018-08-18 NOTE — Care Management Note (Addendum)
Case Management Note  Patient Details  Name: Ryan Macdonald MRN: [id number] Date of Birth: 01/03/1934  Subjective/Objective:     CHF              Action/Plan: Noted admitted 4 times in 6 months. 08/18/2018 - CM talked to patient at the bedside about the benefit of HHC/ RN for Disease Management and HHPT; patient is in agreement; [name] with the [facility] called [phone] ext [phone]); CM talked to [facility] for [facility], she is in agreement and plans to have the [place] [facility] contact the CM; clinical information/ orders faxed to [phone] as requested; CM will continue to follow for progression of care; B Zeniah Briney RN,MHA,BSN   Action/Plan: 07/19/2018- Pt known to me from previous admission, noted admitted 3 times in 6 months;Patient asked CM to call his PCP at the [facility] to make him aware of his admission ( tel # [phone]); message left as requested. CM will continue to follow for progression of care. B Adeana Grilliot RN,MHA,BSN  2:15 pm - Received call from Dr Tereasa Coop at the [facility]; she wants to be called at discharge and fax dc summary. [name] RN,MHA,BSN   05/02/2018 -Llves at home with his spouse; goes to the [facility] for medical Care. Patient goes to the [facility] for primary care and for his medication. [phone]- follow the prompts #1 [facility], Dr Harrie Jeans # [phone]. Patient stated that if his prescriptions are sent today, they will send him the medication the next day; Attending MD, please put the patient last 4 numbers of his social security number on the prescription for the [facility] to identify the patient; ( [id number]).[name] [facility] [phone]  Expected Discharge Date:   possibly 08/22/2018               Expected Discharge Plan:   Home  Status of Service:   In progress  [name] [phone] 08/18/2018, 11:25 AM

## 2018-08-18 NOTE — Interval H&P Note (Signed)
Cath Lab Visit (complete for each Cath Lab visit)  Clinical Evaluation Leading to the Procedure:   ACS: Yes.    Non-ACS:    Anginal Classification: CCS IV  Anti-ischemic medical therapy: Maximal Therapy (2 or more classes of medications)  Non-Invasive Test Results: High-risk stress test findings: cardiac mortality >3%/year  Prior CABG: No previous CABG      History and Physical Interval Note:  08/18/2018 2:03 PM  Ryan Macdonald  has presented today for surgery, with the diagnosis of abnormal stress test  The various methods of treatment have been discussed with the patient and family. After consideration of risks, benefits and other options for treatment, the patient has consented to  Procedure(s): LEFT HEART CATH AND CORONARY ANGIOGRAPHY (N/A) as a surgical intervention .  The patient's history has been reviewed, patient examined, no change in status, stable for surgery.  I have reviewed the patient's chart and labs.  Questions were answered to the patient's satisfaction.     Ryan Macdonald

## 2018-08-18 NOTE — H&P (View-Only) (Signed)
Progress Note  Patient Name: Ryan Macdonald Date of Encounter: 08/18/2018  Primary Cardiologist: [name], MD   Subjective   No CP or dyspnea; complains of knee pain  Inpatient Medications    Scheduled Meds: . allopurinol  200 mg Oral Daily  . amiodarone  200 mg Oral BID  . aspirin  81 mg Oral Pre-Cath  . aspirin EC  81 mg Oral Daily  . carvedilol  6.25 mg Oral BID WC  . ciprofloxacin  1 drop Left Eye QID  . diclofenac sodium  4 g Topical QID  . hydrALAZINE  10 mg Oral Q8H  . insulin aspart  0-15 Units Subcutaneous TID WC  . isosorbide mononitrate  30 mg Oral Daily  . prednisoLONE acetate  1 drop Left Eye QID  . rosuvastatin  20 mg Oral q1800  . sodium chloride flush  3 mL Intravenous Q12H  . cyanocobalamin  1,000 mcg Oral Daily   Continuous Infusions: . sodium chloride    . sodium chloride     PRN Meds: sodium chloride, acetaminophen, docusate sodium, guaiFENesin, nitroGLYCERIN, ondansetron (ZOFRAN) IV, sertraline, sodium chloride flush   Vital Signs    Vitals:   08/17/18 1600 08/17/18 1951 08/18/18 0501 08/18/18 0802  BP: 114/65 112/75 113/75 104/65  Pulse: 70 70 70 69  Resp: 18 18 18    Temp: 98 F (36.7 C) 98.6 F (37 C) 97.9 F (36.6 C)   TempSrc: Oral Oral Oral   SpO2: 98% 97% 98% 99%  Weight:   110.6 kg   Height:        Intake/Output Summary (Last 24 hours) at 08/18/2018 1012 Last data filed at 08/18/2018 2395 Gross per 24 hour  Intake 760 ml  Output 800 ml  Net -40 ml   Filed Weights   08/16/18 0638 08/17/18 0616 08/18/18 0501  Weight: 112.2 kg 110.9 kg 110.6 kg    Telemetry    Sinus with intermittent V pacing- Personally Reviewed  Physical Exam   GEN: WD NAD Neck: supple Cardiac: RRR, no murmur Respiratory: CTA; no wheeze GI: Soft, NT/ND, no masses MS: No edema Neuro:  No focal findings   Labs    Chemistry Recent Labs  Lab 08/16/18 0611 08/17/18 0525 08/18/18 0534  NA 142 142 142  K 3.6 3.7 3.9  CL 102 102 98    CO2 31 30 30   GLUCOSE 113* 105* 128*  BUN 21 22 24*  CREATININE 1.51* 1.60* 1.55*  CALCIUM 8.8* 8.9 9.2  GFRNONAA 41* 38* 40*  GFRAA 48* 45* 46*  ANIONGAP 9 10 14      Hematology Recent Labs  Lab 08/14/18 0723 08/14/18 0740 08/15/18 0207  WBC 9.3  --  6.9  RBC 4.00*  --  3.40*  HGB 12.3* 12.9* 10.5*  HCT 39.3 38.0* 33.3*  MCV 98.3  --  97.9  MCH 30.8  --  30.9  MCHC 31.3  --  31.5  RDW 14.7  --  14.7  PLT 250  --  211    Cardiac Enzymes Recent Labs  Lab 08/14/18 1937 08/15/18 0207  TROPONINI 0.07* 0.06*    Recent Labs  Lab 08/14/18 0732  TROPIPOC 0.04     BNP Recent Labs  Lab 08/14/18 0723  BNP 497.0*     Patient Profile     84 year old male with past medical history of nonischemic cardiomyopathy, prior ICD, diabetes mellitus, hypertension, paroxysmal atrial fibrillation, chronic stage III kidney disease with acute on chronic systolic congestive heart failure. Echocardiogram  6/19 showed ejection fraction 20 to 25%; trace aortic insufficiency, mild mitral regurgitation and mild left atrial enlargement. Nuclear study is abnormal with ejection fraction 23%, prior inferior lateral infarct and ischemia in the distal anteroseptal and apical walls.    Assessment & Plan    1 acute on chronic systolic congestive heart failure-symptoms have improved since admission.  Diuretics are on hold prior to catheterization.  Will resume following procedure.  Nuclear study is abnormal with ejection fraction 23%, prior inferior lateral infarct and ischemia in the distal anteroseptal and apical walls.  For cardiac catheterization today to see if coronary artery disease is contributing to his CHF/cardiomyopathy.  The risks and benefits including myocardial infarction, CVA and death discussed were previously discussed and he agreed to proceed.  We also discussed risk of contrast nephropathy.  No ventriculogram and limit dye.  Also note renal Dopplers do not suggest renal artery stenosis.   Continue aspirin.  2 paroxysmal atrial fibrillation-continue amiodarone; apixaban on hold for cardiac catheterization.  Will resume following procedure.  3 nonischemic cardiomyopathy-as outlined above nuclear study significantly abnormal.  Question of coronary disease contributing to his cardiomyopathy.  Plan catheterization today to further assess. Will continue present dose of carvedilol, low-dose hydralazine and nitrates.  No ACE inhibitor, ARB or Entresto given history of angioedema.  4 hypertension-blood pressure controlled.  Continue present medications.  5 chronic stage III kidney disease-hold diuretics prior to catheterization.  Follow renal function afterwards.  For questions or updates, please contact [facility] Please consult [email or web address] for contact info under        Signed, Olga Millers, MD  08/18/2018, 10:12 AM

## 2018-08-18 NOTE — Progress Notes (Signed)
Progress Note  Patient Name: Ryan Macdonald Date of Encounter: 08/18/2018  Primary Cardiologist: [name], MD   Subjective   No CP or dyspnea; complains of knee pain  Inpatient Medications    Scheduled Meds: . allopurinol  200 mg Oral Daily  . amiodarone  200 mg Oral BID  . aspirin  81 mg Oral Pre-Cath  . aspirin EC  81 mg Oral Daily  . carvedilol  6.25 mg Oral BID WC  . ciprofloxacin  1 drop Left Eye QID  . diclofenac sodium  4 g Topical QID  . hydrALAZINE  10 mg Oral Q8H  . insulin aspart  0-15 Units Subcutaneous TID WC  . isosorbide mononitrate  30 mg Oral Daily  . prednisoLONE acetate  1 drop Left Eye QID  . rosuvastatin  20 mg Oral q1800  . sodium chloride flush  3 mL Intravenous Q12H  . cyanocobalamin  1,000 mcg Oral Daily   Continuous Infusions: . sodium chloride    . sodium chloride     PRN Meds: sodium chloride, acetaminophen, docusate sodium, guaiFENesin, nitroGLYCERIN, ondansetron (ZOFRAN) IV, sertraline, sodium chloride flush   Vital Signs    Vitals:   08/17/18 1600 08/17/18 1951 08/18/18 0501 08/18/18 0802  BP: 114/65 112/75 113/75 104/65  Pulse: 70 70 70 69  Resp: 18 18 18    Temp: 98 F (36.7 C) 98.6 F (37 C) 97.9 F (36.6 C)   TempSrc: Oral Oral Oral   SpO2: 98% 97% 98% 99%  Weight:   110.6 kg   Height:        Intake/Output Summary (Last 24 hours) at 08/18/2018 1012 Last data filed at 08/18/2018 2395 Gross per 24 hour  Intake 760 ml  Output 800 ml  Net -40 ml   Filed Weights   08/16/18 0638 08/17/18 0616 08/18/18 0501  Weight: 112.2 kg 110.9 kg 110.6 kg    Telemetry    Sinus with intermittent V pacing- Personally Reviewed  Physical Exam   GEN: WD NAD Neck: supple Cardiac: RRR, no murmur Respiratory: CTA; no wheeze GI: Soft, NT/ND, no masses MS: No edema Neuro:  No focal findings   Labs    Chemistry Recent Labs  Lab 08/16/18 0611 08/17/18 0525 08/18/18 0534  NA 142 142 142  K 3.6 3.7 3.9  CL 102 102 98    CO2 31 30 30   GLUCOSE 113* 105* 128*  BUN 21 22 24*  CREATININE 1.51* 1.60* 1.55*  CALCIUM 8.8* 8.9 9.2  GFRNONAA 41* 38* 40*  GFRAA 48* 45* 46*  ANIONGAP 9 10 14      Hematology Recent Labs  Lab 08/14/18 0723 08/14/18 0740 08/15/18 0207  WBC 9.3  --  6.9  RBC 4.00*  --  3.40*  HGB 12.3* 12.9* 10.5*  HCT 39.3 38.0* 33.3*  MCV 98.3  --  97.9  MCH 30.8  --  30.9  MCHC 31.3  --  31.5  RDW 14.7  --  14.7  PLT 250  --  211    Cardiac Enzymes Recent Labs  Lab 08/14/18 1937 08/15/18 0207  TROPONINI 0.07* 0.06*    Recent Labs  Lab 08/14/18 0732  TROPIPOC 0.04     BNP Recent Labs  Lab 08/14/18 0723  BNP 497.0*     Patient Profile     84 year old male with past medical history of nonischemic cardiomyopathy, prior ICD, diabetes mellitus, hypertension, paroxysmal atrial fibrillation, chronic stage III kidney disease with acute on chronic systolic congestive heart failure. Echocardiogram  6/19 showed ejection fraction 20 to 25%; trace aortic insufficiency, mild mitral regurgitation and mild left atrial enlargement. Nuclear study is abnormal with ejection fraction 23%, prior inferior lateral infarct and ischemia in the distal anteroseptal and apical walls.    Assessment & Plan    1 acute on chronic systolic congestive heart failure-symptoms have improved since admission.  Diuretics are on hold prior to catheterization.  Will resume following procedure.  Nuclear study is abnormal with ejection fraction 23%, prior inferior lateral infarct and ischemia in the distal anteroseptal and apical walls.  For cardiac catheterization today to see if coronary artery disease is contributing to his CHF/cardiomyopathy.  The risks and benefits including myocardial infarction, CVA and death discussed were previously discussed and he agreed to proceed.  We also discussed risk of contrast nephropathy.  No ventriculogram and limit dye.  Also note renal Dopplers do not suggest renal artery stenosis.   Continue aspirin.  2 paroxysmal atrial fibrillation-continue amiodarone; apixaban on hold for cardiac catheterization.  Will resume following procedure.  3 nonischemic cardiomyopathy-as outlined above nuclear study significantly abnormal.  Question of coronary disease contributing to his cardiomyopathy.  Plan catheterization today to further assess. Will continue present dose of carvedilol, low-dose hydralazine and nitrates.  No ACE inhibitor, ARB or Entresto given history of angioedema.  4 hypertension-blood pressure controlled.  Continue present medications.  5 chronic stage III kidney disease-hold diuretics prior to catheterization.  Follow renal function afterwards.  For questions or updates, please contact [facility] Please consult [email or web address] for contact info under        Signed, Olga Millers, MD  08/18/2018, 10:12 AM

## 2018-08-19 DIAGNOSIS — I5043 Acute on chronic combined systolic (congestive) and diastolic (congestive) heart failure: Secondary | ICD-10-CM

## 2018-08-19 LAB — URINALYSIS, ROUTINE W REFLEX MICROSCOPIC
BILIRUBIN URINE: NEGATIVE
Glucose, UA: NEGATIVE mg/dL
Ketones, ur: NEGATIVE mg/dL
NITRITE: NEGATIVE
PH: 5 (ref 5.0–8.0)
Protein, ur: 30 mg/dL — AB
RBC / HPF: >50 RBC/hpf — ABNORMAL HIGH (ref 0–5)
SPECIFIC GRAVITY, URINE: 1.033 — AB (ref 1.005–1.030)

## 2018-08-19 LAB — BASIC METABOLIC PANEL
ANION GAP: 10 (ref 5–15)
BUN: 22 mg/dL (ref 8–23)
CALCIUM: 8.9 mg/dL (ref 8.9–10.3)
CO2: 29 mmol/L (ref 22–32)
Chloride: 100 mmol/L (ref 98–111)
Creatinine, Ser: 1.43 mg/dL — ABNORMAL HIGH (ref 0.61–1.24)
GFR calc non Af Amer: 44 mL/min — ABNORMAL LOW (ref >60)
GFR, EST AFRICAN AMERICAN: 51 mL/min — AB (ref >60)
Glucose, Bld: 127 mg/dL — ABNORMAL HIGH (ref 70–99)
Potassium: 4 mmol/L (ref 3.5–5.1)
SODIUM: 139 mmol/L (ref 135–145)

## 2018-08-19 LAB — CBC
HCT: 34 % — ABNORMAL LOW (ref 39.0–52.0)
HEMOGLOBIN: 10.9 g/dL — AB (ref 13.0–17.0)
MCH: 30.9 pg (ref 26.0–34.0)
MCHC: 32.1 g/dL (ref 30.0–36.0)
MCV: 96.3 fL (ref 78.0–100.0)
Platelets: 236 10*3/uL (ref 150–400)
RBC: 3.53 MIL/uL — ABNORMAL LOW (ref 4.22–5.81)
RDW: 14.3 % (ref 11.5–15.5)
WBC: 8.5 10*3/uL (ref 4.0–10.5)

## 2018-08-19 LAB — GLUCOSE, CAPILLARY
GLUCOSE-CAPILLARY: 134 mg/dL — AB (ref 70–99)
GLUCOSE-CAPILLARY: 124 mg/dL — AB (ref 70–99)
Glucose-Capillary: 124 mg/dL — ABNORMAL HIGH (ref 70–99)

## 2018-08-19 MED ORDER — NITROGLYCERIN 0.4 MG SL SUBL: 0.4000 mg | SUBLINGUAL_TABLET | SUBLINGUAL | 0 refills | Status: DC | PRN

## 2018-08-19 MED ORDER — HYDRALAZINE HCL 10 MG PO TABS: 10.0000 mg | ORAL_TABLET | Freq: Three times a day (TID) | ORAL | 0 refills | Status: DC

## 2018-08-19 MED ORDER — ANGIOPLASTY BOOK
Freq: Once | Status: AC
  Administered 2018-08-19: 06:00:00
  Filled 2018-08-19: qty 1

## 2018-08-19 MED ORDER — ISOSORBIDE MONONITRATE ER 30 MG PO TB24: 30.0000 mg | ORAL_TABLET | Freq: Every day | ORAL | 0 refills | Status: DC

## 2018-08-19 MED ORDER — CLOPIDOGREL BISULFATE 75 MG PO TABS: 75.0000 mg | ORAL_TABLET | Freq: Every day | ORAL | 0 refills | Status: DC

## 2018-08-19 MED ORDER — ASPIRIN 81 MG PO TBEC: 81.0000 mg | DELAYED_RELEASE_TABLET | Freq: Every day | ORAL | Status: DC

## 2018-08-19 MED ORDER — GUAIFENESIN-DM 100-10 MG/5ML PO SYRP
5.0000 mL | ORAL_SOLUTION | ORAL | Status: DC | PRN
  Administered 2018-08-19: 5 mL via ORAL
  Filled 2018-08-19: qty 5

## 2018-08-19 NOTE — Progress Notes (Signed)
Progress Note  Patient Name: Ryan Macdonald Date of Encounter: 08/19/2018  Primary Cardiologist: [name], MD   Subjective   No CP or dyspnea; complains of knee pain  Inpatient Medications    Scheduled Meds: . allopurinol  200 mg Oral Daily  . amiodarone  200 mg Oral BID  . aspirin EC  81 mg Oral Daily  . carvedilol  6.25 mg Oral BID WC  . ciprofloxacin  1 drop Left Eye QID  . clopidogrel  75 mg Oral Q breakfast  . diclofenac sodium  4 g Topical QID  . hydrALAZINE  10 mg Oral Q8H  . insulin aspart  0-15 Units Subcutaneous TID WC  . isosorbide mononitrate  30 mg Oral Daily  . prednisoLONE acetate  1 drop Left Eye QID  . rosuvastatin  20 mg Oral q1800  . sodium chloride flush  3 mL Intravenous Q12H  . cyanocobalamin  1,000 mcg Oral Daily   Continuous Infusions: . sodium chloride     PRN Meds: sodium chloride, acetaminophen, docusate sodium, guaiFENesin, nitroGLYCERIN, ondansetron (ZOFRAN) IV, sertraline, sodium chloride flush   Vital Signs    Vitals:   08/18/18 2036 08/18/18 2138 08/18/18 2354 08/19/18 0609  BP:  105/78 106/83 125/70  Pulse: 69  71 70  Resp:   (!) 22 20  Temp: 99.6 F (37.6 C)  98.1 F (36.7 C) 98 F (36.7 C)  TempSrc: Oral  Oral Oral  SpO2: 95%  96% 100%  Weight:    113.9 kg  Height:        Intake/Output Summary (Last 24 hours) at 08/19/2018 0800 Last data filed at 08/19/2018 0427 Gross per 24 hour  Intake 255.3 ml  Output 500 ml  Net -244.7 ml   Filed Weights   08/17/18 0616 08/18/18 0501 08/19/18 0609  Weight: 110.9 kg 110.6 kg 113.9 kg    Telemetry    SR   V pacing  Personally Reviewed  Physical Exam   GEN: WD NAD Neck: supple Cardiac: RRR, no murmur Respiratory: CTA; no wheeze GI: Soft, NT/ND, no masses MS: No LE  edema Neuro:  No focal findings   Labs    Chemistry Recent Labs  Lab 08/17/18 0525 08/18/18 0534 08/19/18 0451  NA 142 142 139  K 3.7 3.9 4.0  CL 102 98 100  CO2 30 30 29   GLUCOSE 105*  128* 127*  BUN 22 24* 22  CREATININE 1.60* 1.55* 1.43*  CALCIUM 8.9 9.2 8.9  GFRNONAA 38* 40* 44*  GFRAA 45* 46* 51*  ANIONGAP 10 14 10      Hematology Recent Labs  Lab 08/14/18 0723 08/14/18 0740 08/15/18 0207 08/19/18 0451  WBC 9.3  --  6.9 8.5  RBC 4.00*  --  3.40* 3.53*  HGB 12.3* 12.9* 10.5* 10.9*  HCT 39.3 38.0* 33.3* 34.0*  MCV 98.3  --  97.9 96.3  MCH 30.8  --  30.9 30.9  MCHC 31.3  --  31.5 32.1  RDW 14.7  --  14.7 14.3  PLT 250  --  211 236    Cardiac Enzymes Recent Labs  Lab 08/14/18 1937 08/15/18 0207  TROPONINI 0.07* 0.06*    Recent Labs  Lab 08/14/18 0732  TROPIPOC 0.04     BNP Recent Labs  Lab 08/14/18 0723  BNP 497.0*     Cath    08/18/18   Mid LM to Dist LM lesion is 40% stenosed. Cross sectional area 6.7 cm2.  Ost Cx lesion is 50% stenosed.  Cross sectional area 5.5 cm2.  Mid LAD lesion is 50% stenosed.  LV end diastolic pressure is normal. LVEDP 12 mm Hg.  There is no aortic valve stenosis.  Prox LAD lesion is 75% stenosed. Severely calcified. Cross sectional area 3.2 and heavily calcified.  A drug-eluting stent was successfully placed using a STENT SYNERGY DES 3X12, postdilated to 3.3 mm.  Post intervention, there is a 0% residual stenosis.     Recommend to resume Apixaban, at currently prescribed dose and frequency, on 08/19/18.  Recommend concurrent antiplatelet therapy of Aspirin 81mg  daily for 1 month and Clopidogrel 75mg  daily for 6 months.    Initially given Brilinta for quick onset but he had some nausea and vomiting post cath.  IV tirofiban given.  Will give clopidogrel 300 mg x1 followed by 75 mg daily.    Patient Profile     84 year old male with past medical history of nonischemic cardiomyopathy, prior ICD, diabetes mellitus, hypertension, paroxysmal atrial fibrillation, chronic stage III kidney disease with acute on chronic systolic congestive heart failure. Echocardiogram 6/19 showed ejection fraction 20 to  25%; trace aortic insufficiency, mild mitral regurgitation and mild left atrial enlargement. Nuclear study is abnormal with ejection fraction 23%, prior inferior lateral infarct and ischemia in the distal anteroseptal and apical walls.    Assessment & Plan    1  CAD   Pt had abnormal myovue   LVEF 23%   Wetn on to have cath yesterday   Findings as above Plan for ASA and Plavix for 71m moth then plavix       2  Chronic systolic CHF   Continue coreg, hydralzine and NTG  3 paroxysmal atrial fibrillation-continue amiodarone and apixiban     4 hypertension-blood pressure controlled.  Continue present medications.  5 chronic stage III kidney disease-hold diuretics prior to catheterization.  Follow renal function afterwards.  6   PT/OT   Will have pt evaluated   He is right now a 2 person assist to move around   This is limiting him going home.      For questions or updates, please contact [facility] Please consult [email or web address] for contact info under        Signed, Dietrich Pates, MD  08/19/2018, 8:00 AM

## 2018-08-19 NOTE — Progress Notes (Signed)
[facility] BAND REMOVAL  LOCATION:    right radial  DEFLATED PER PROTOCOL:    Yes.    TIME BAND OFF / DRESSING APPLIED:    2140   SITE UPON ARRIVAL:    Level 1  SITE AFTER BAND REMOVAL:    Level 1  CIRCULATION SENSATION AND MOVEMENT:    Within Normal Limits   Yes.    COMMENTS:   Pt.tolerated well

## 2018-08-19 NOTE — Discharge Summary (Addendum)
Discharge Summary    Patient ID: Ryan Macdonald MRN: [id number]; DOB: [birth year]  Admit date: 08/14/2018 Discharge date: 08/19/2018  Primary Care Provider: [facility]  Primary Cardiologist: [name], MD  Primary Electrophysiologist:  None   Discharge Diagnoses    Principal Problem:   Acute on chronic combined systolic and diastolic heart failure [facility]) Active Problems:   Nonischemic cardiomyopathy (HCC)   Obesity (BMI 30-39.9)   Essential hypertension   Hyperlipidemia   Atrial fibrillation (HCC)   Chronic renal insufficiency, stage 3 (moderate) (HCC)   ICD (implantable cardioverter-defibrillator) in place   CHF (congestive heart failure) (HCC)   Allergies Allergies  Allergen Reactions  . Lisinopril Swelling    Facial swelling  . Xarelto [Rivaroxaban]     bleeding    Diagnostic Studies/Procedures    Left heart cath 08/19/18:  Mid LM to Dist LM lesion is 40% stenosed. Cross sectional area 6.7 cm2.  Ost Cx lesion is 50% stenosed. Cross sectional area 5.5 cm2.  Mid LAD lesion is 50% stenosed.  LV end diastolic pressure is normal. LVEDP 12 mm Hg.  There is no aortic valve stenosis.  Prox LAD lesion is 75% stenosed. Severely calcified. Cross sectional area 3.2 and heavily calcified.  A drug-eluting stent was successfully placed using a STENT SYNERGY DES 3X12, postdilated to 3.3 mm.  Post intervention, there is a 0% residual stenosis.   Recommend to resume Apixaban, at currently prescribed dose and frequency, on 08/19/18.  Recommend concurrent antiplatelet therapy of Aspirin 81mg  daily for 1 month and Clopidogrel 75mg  daily for 6 months.    Initially given Brilinta for quick onset but he had some nausea and vomiting post cath.  IV tirofiban given.  Will give clopidogrel 300 mg x1 followed by 75 mg daily.   Hydration due to baseline renal insufficiency.     Echo 04/30/18: Study Conclusions - Left ventricle: The cavity size was moderately  dilated. Wall   thickness was normal. Systolic function was severely reduced. The   estimated ejection fraction was in the range of 20% to 25%.   Diffuse hypokinesis. Features are consistent with a pseudonormal   left ventricular filling pattern, with concomitant abnormal   relaxation and increased filling pressure (grade 2 diastolic   dysfunction). Doppler parameters are consistent with high   ventricular filling pressure. - Aortic valve: There was trivial regurgitation. - Mitral valve: There was mild regurgitation. Valve area by   pressure half-time: 1.09 cm^2. - Left atrium: The atrium was mildly dilated. - Right ventricle: Pacer wire or catheter noted in right ventricle.   Systolic function was reduced.   History of Present Illness     [name] is an 84 year old male with a history stated above who presented to [facility] on 08/14/2018 after waking this morning "not feeling well" and was mildly SOB. Pt states that he is well in-tuned to his health and can tell when he needs to go to the emergency department. He states that he woke this morning and took his BP and weight (which were noted to be normal per pt) and took his medications as usual, however became mildly short of breath and felt that he would need to be seen in the ED as this was his exact presentation as his last hospitalization several weeks ago. Upon EMS arrival, pt was severely SOB per chart review and required Bipap for transport to [facility]. His BP was markedly elevated with a SBP in the 200's. He was given SL NTG and his  BP normalized by arrival. He was also noted to be in atrial fibrillation in the ED with rate control.   During initial interview, patient is comfortable on nasal cannula without respiratory distress. He reports complete diet and medication compliance since his last follow up appointment on 08/08/18.  Denies chest pain, palpitations, dizziness, orthopnea, change in LE swelling or weight, PND, presyncope or syncopal  episodes. He denies recent illness however, reports ongoing cough for approximately 2 weeks in duration.  In the ED, EKG revealed NSR with rate control and no acute ischemic changes. BNP was elevated at 497. I-STAT troponin mildly elevated at 0.04 with no anginal symptoms. Creatinine elevated at 1.70. CXR with cardiomegaly and vascular congestion.  Of note patient was last seen in the office on 08/08/2018 for post hospital follow-up after an admission for acute on chronic combined systolic and diastolic heart failure on 07/20/2018.  During that admission, he was diuresed on IV Lasix and transition to PO torsemide with a dry weight of 253.7lbs.  His isosorbide was discontinued secondary to soft BP however, he was continued on his beta-blocker and hydralazine.  He has a known allergy to ACE inhibitors.  During this visit, he had no further cardiac complaints and found it much easier to keep his weight and fluid balance under control with the transition to torsemide from Lasix.  It was noted that he is followed at the [facility] by Dr. Len Childs for PCP needs.  His medications are provided by the [place].  Hospital Course     Consultants: none  Acute on chronic combined systolic and diastolic heart failure Nonischemic cardiomyopathy Recently hospitalized 07/20/18 for the same, seen in follow up on 08/08/18 without complaints. Lasix had been transitioned to 20 mg torsemide. He presented back to [facility] 08/14/18 with "not feeling well," mild shortness of breath, and hypertension. He did not have pedal edema or weight gain. BNP was mildly elevated.    CAD s/p DES to proximal LAD Myoview stress test was performed and revealed moderate reversible perfusion defect in the distal anteroseptal and apical walls with large myocardial infarct involving the lateral and inferior walls. Results were discussed with patient and the decision was made to proceed with heart cath. Heart cath revealed 75% stenosis in the proximal LAD treated  with DES. There is residual 40% stenosis in the LM and 50% stenosis in the ostial Cx. He tolerated the procedure well. Right radial access site C/D/I without hematoma. ASA x 1 month and plavix x 6 months in the setting of eliquis.    HTN Renal duplex was negative for stenosis, but limited views on the right.  Continue coreg, hydralazine, and imdur   Paroxysmal atrial fibrillation Continue amiodarone 200 mg BID and eliquis 2.5 mg BID for age and renal dosing. This patients CHA2DS2-VASc Score and unadjusted Ischemic Stroke Rate (% per year) is equal to 9.7 % stroke rate/year from a score of 6 (CHF, age, male, CAD, DM).     _____________  Discharge Vitals Blood pressure 113/86, pulse 69, temperature 98.6 F (37 C), temperature source Oral, resp. rate 20, height 5\' 7"  (1.702 m), weight 113.9 kg, SpO2 91 %.  Filed Weights   08/17/18 0616 08/18/18 0501 08/19/18 0609  Weight: 110.9 kg 110.6 kg 113.9 kg    Labs & Radiologic Studies    CBC Recent Labs    08/19/18 0451  WBC 8.5  HGB 10.9*  HCT 34.0*  MCV 96.3  PLT 236   Basic Metabolic Panel Recent Labs  08/18/18 0534 08/19/18 0451  NA 142 139  K 3.9 4.0  CL 98 100  CO2 30 29  GLUCOSE 128* 127*  BUN 24* 22  CREATININE 1.55* 1.43*  CALCIUM 9.2 8.9   Liver Function Tests No results for input(s): AST, ALT, ALKPHOS, BILITOT, PROT, ALBUMIN in the last 72 hours. No results for input(s): LIPASE, AMYLASE in the last 72 hours. Cardiac Enzymes No results for input(s): CKTOTAL, CKMB, CKMBINDEX, TROPONINI in the last 72 hours. BNP Invalid input(s): POCBNP D-Dimer No results for input(s): DDIMER in the last 72 hours. Hemoglobin A1C No results for input(s): HGBA1C in the last 72 hours. Fasting Lipid Panel No results for input(s): CHOL, HDL, LDLCALC, TRIG, CHOLHDL, LDLDIRECT in the last 72 hours. Thyroid Function Tests No results for input(s): TSH, T4TOTAL, T3FREE, THYROIDAB in the last 72 hours.  Invalid input(s):  FREET3 _____________  Dg Chest 2 View  Result Date: 07/31/2018 CLINICAL DATA:  Shortness of breath.  History of CHF. EXAM: CHEST - 2 VIEW COMPARISON:  July 28, 2018 FINDINGS: Stable cardiomegaly and AICD device. The hila and mediastinum are normal. No pneumothorax. No pulmonary nodules, masses, focal infiltrates, or overt edema. IMPRESSION: Cardiomegaly.  No overt pulmonary edema identified. Electronically Signed   By: Gerome Sam III M.D   On: 07/31/2018 23:59   Nm Myocar Multi W/spect Izetta Dakin Motion / Ef  Result Date: 08/16/2018 CLINICAL DATA:  Shortness of breath. Congestive heart failure. Atrial fibrillation. Diabetes, hypertension, and hypercholesterolemia. EXAM: MYOCARDIAL IMAGING WITH SPECT (REST AND PHARMACOLOGIC-STRESS) GATED LEFT VENTRICULAR WALL MOTION STUDY LEFT VENTRICULAR EJECTION FRACTION TECHNIQUE: Standard myocardial SPECT imaging was performed after resting intravenous injection of 10 mCi Tc-33m tetrofosmin. Subsequently, intravenous infusion of Lexiscan was performed under the supervision of the Cardiology staff. At peak effect of the drug, 30 mCi Tc-60m tetrofosmin was injected intravenously and standard myocardial SPECT imaging was performed. Quantitative gated imaging was also performed to evaluate left ventricular wall motion, and estimate left ventricular ejection fraction. COMPARISON:  None. FINDINGS: Perfusion: Large fixed myocardial perfusion defect is seen on both the stress and resting images involving the lateral and inferior walls of the left ventricle. A moderate reversible myocardial perfusion defect is seen involving the distal anteroseptal and apical walls, suspicious for inducible myocardial ischemia. Wall Motion: Mild left ventricular dilatation. Hypokinesis involving the inferior and lateral walls. Left Ventricular Ejection Fraction: 23 % End diastolic volume 185 ml End systolic volume 54 ml IMPRESSION: 1. Moderate reversible perfusion defect involving the distal  anteroseptal and apical walls, suspicious for inducible myocardial ischemia. Large myocardial infarct involving the lateral an inferior walls. 2. Mild left ventricular dilatation, with inferior and lateral wall hypokinesis. 3. Left ventricular ejection fraction 23% 4. Non invasive risk stratification*: High *2012 Appropriate Use Criteria for Coronary Revascularization Focused Update: J Am Coll Cardiol. 2012;59(9):857-881. http://content.[email or web address].aspx?articleid=[email or web address] Electronically Signed   By: Myles Rosenthal M.D.   On: 08/16/2018 15:17   Dg Chest Port 1 View  Result Date: 08/14/2018 CLINICAL DATA:  Respiratory distress EXAM: PORTABLE CHEST 1 VIEW COMPARISON:  07/31/2018 FINDINGS: Single chamber ICD lead into the right ventricle. There is cardiomegaly and aortic tortuosity. Cardiomediastinal contours are distorted by rightward rotation. Vascular congestion without Kerley lines or air bronchogram. No pneumothorax. IMPRESSION: Cardiomegaly and vascular congestion. Electronically Signed   By: Marnee Spring M.D.   On: 08/14/2018 07:18   Dg Chest Portable 1 View  Result Date: 07/28/2018 CLINICAL DATA:  84 y/o  M; respiratory distress 3 hours ago. EXAM: PORTABLE CHEST 1 VIEW  COMPARISON:  07/18/2018 chest radiograph FINDINGS: Stable cardiomegaly given projection and technique. Single lead AICD. Aortic atherosclerosis with calcification. Diffuse reticular and hazy opacities of the lungs. No pleural effusion or pneumothorax. No acute osseous abnormality is evident. IMPRESSION: Stable mild cardiomegaly.  Interstitial pulmonary edema. Electronically Signed   By: Mitzi Hansen M.D.   On: 07/28/2018 04:13   Disposition   Pt is being discharged home today in good condition.  Follow-up Plans & Appointments    Follow-up Information    [name], MD Follow up.   Specialty:  Cardiology Why:  Office will call you with a follow up appt.  Contact information: [address] [place] [address] [phone]          Discharge Instructions    Amb Referral to Cardiac Rehabilitation   Complete by:  As directed    Diagnosis:  Coronary Stents   Call MD for:  redness, tenderness, or signs of infection (pain, swelling, redness, odor or green/yellow discharge around incision site)   Complete by:  As directed    Diet - low sodium heart healthy   Complete by:  As directed    Discharge instructions   Complete by:  As directed    Radial Site Care Refer to this sheet in the next few weeks. These instructions provide you with information on caring for yourself after your procedure. Your caregiver may also give you more specific instructions. Your treatment has been planned according to current medical practices, but problems sometimes occur. Call your caregiver if you have any problems or questions after your procedure. HOME CARE INSTRUCTIONS You may shower the day after the procedure.Remove the bandage (dressing) and gently wash the site with plain soap and water.Gently pat the site dry.  Do not apply powder or lotion to the site.  Do not submerge the affected site in water for 3 to 5 days.  Inspect the site at least twice daily.  Do not flex or bend the affected arm for 24 hours.  No lifting over 5 pounds (2.3 kg) for 5 days after your procedure.  Do not drive home if you are discharged the same day of the procedure. Have someone else drive you.  You may drive 24 hours after the procedure unless otherwise instructed by your caregiver.  What to expect: Any bruising will usually fade within 1 to 2 weeks.  Blood that collects in the tissue (hematoma) may be painful to the touch. It should usually decrease in size and tenderness within 1 to 2 weeks.  SEEK IMMEDIATE MEDICAL CARE IF: You have unusual pain at the radial site.  You have redness, warmth, swelling, or pain at the radial site.  You have drainage (other than a small amount of blood on the dressing).    You have chills.  You have a fever or persistent symptoms for more than 72 hours.  You have a fever and your symptoms suddenly get worse.  Your arm becomes pale, cool, tingly, or numb.  You have heavy bleeding from the site. Hold pressure on the site.   You will be on triple therapy with aspirin, plavix and eliquis for one month, then plan to stop aspirin. Please monitor for signs/symptoms of bleeding while on these medications. Please call with any questions.   Increase activity slowly   Complete by:  As directed       Discharge Medications   Allergies as of 08/19/2018      Reactions   Lisinopril  Swelling   Facial swelling   Xarelto [rivaroxaban]    bleeding      Medication List    TAKE these medications   allopurinol 100 MG tablet Commonly known as:  ZYLOPRIM Take 200 mg by mouth daily.   amiodarone 200 MG tablet Commonly known as:  PACERONE Take 1 tablet (200 mg total) by mouth 2 (two) times daily.   apixaban 2.5 MG Tabs tablet Commonly known as:  ELIQUIS Take 1 tablet (2.5 mg total) by mouth 2 (two) times daily.   aspirin 81 MG EC tablet Take 1 tablet (81 mg total) by mouth daily. Start taking on:  08/20/2018   carvedilol 12.5 MG tablet Commonly known as:  COREG Take 0.5 tablets (6.25 mg total) by mouth 2 (two) times daily with a meal.   ciprofloxacin 0.3 % ophthalmic solution Commonly known as:  CILOXAN Place 1 drop into the left eye 4 (four) times daily. Administer 1 drop, every 2 hours, while awake, for 2 days. Then 1 drop, every 4 hours, while awake, for the next 5 days.   clopidogrel 75 MG tablet Commonly known as:  PLAVIX Take 1 tablet (75 mg total) by mouth daily with breakfast. Start taking on:  08/20/2018   cyanocobalamin 1000 MCG tablet Take 1,000 mcg by mouth daily.   diclofenac sodium 1 % Gel Commonly known as:  VOLTAREN Apply 4 g topically 4 (four) times daily. What changed:    when to take this  reasons to take this   docusate sodium  100 MG capsule Commonly known as:  COLACE Take 1 capsule (100 mg total) by mouth 2 (two) times daily as needed for mild constipation.   finasteride 5 MG tablet Commonly known as:  PROSCAR Take 5 mg by mouth daily as needed (unk).   fluticasone furoate-vilanterol 100-25 MCG/INH Aepb Commonly known as:  BREO ELLIPTA Inhale 1 puff into the lungs daily.   glipiZIDE 5 MG tablet Commonly known as:  GLUCOTROL Take 5 mg by mouth daily before breakfast.   hydrALAZINE 10 MG tablet Commonly known as:  APRESOLINE Take 1 tablet (10 mg total) by mouth every 8 (eight) hours.   HYDROcodone-acetaminophen 5-325 MG tablet Commonly known as:  NORCO/VICODIN Take 2 tablets by mouth every 4 (four) hours as needed. What changed:    how much to take  when to take this  reasons to take this   HYDROXYZINE HCL PO Take 1 tablet by mouth at bedtime as needed (sleep).   ipratropium-albuterol 0.5-2.5 (3) MG/3ML Soln Commonly known as:  DUONEB Take 3 mLs by nebulization every 4 (four) hours as needed. What changed:  reasons to take this   isosorbide mononitrate 30 MG 24 hr tablet Commonly known as:  IMDUR Take 1 tablet (30 mg total) by mouth daily. Start taking on:  08/20/2018   ketoconazole 2 % shampoo Commonly known as:  NIZORAL Apply 1 application topically 2 (two) times a week.   nitroGLYCERIN 0.4 MG SL tablet Commonly known as:  NITROSTAT Place 1 tablet (0.4 mg total) under the tongue every 5 (five) minutes x 3 doses as needed for chest pain.   prednisoLONE acetate 1 % ophthalmic suspension Commonly known as:  PRED FORTE Place 1 drop into the left eye 4 (four) times daily.   PRESCRIPTION MEDICATION Apply 1 application topically daily as needed (pain). ThermaGel   rosuvastatin 20 MG tablet Commonly known as:  CRESTOR Take 1 tablet (20 mg total) by mouth daily at 6 PM.   sertraline  25 MG tablet Commonly known as:  ZOLOFT Take 25-50 mg by mouth daily as needed (for anxiety).     tamsulosin 0.4 MG Caps capsule Commonly known as:  FLOMAX Take 0.4 mg by mouth at bedtime.   torsemide 20 MG tablet Commonly known as:  DEMADEX Take one 20mg  tablet daily.  However, if weight up 3lbs in 24 hours take an extra dose that day.  If weight is below your dry weight skip your dose that day.       Acute coronary syndrome (MI, NSTEMI, STEMI, etc) this admission?: Yes.     AHA/ACC Clinical Performance & Quality Measures: 1. Aspirin prescribed? - Yes 2. ADP Receptor Inhibitor (Plavix/Clopidogrel, Brilinta/Ticagrelor or Effient/Prasugrel) prescribed (includes medically managed patients)? - Yes 3. Beta Blocker prescribed? - Yes 4. High Intensity Statin (Lipitor 40-80mg  or Crestor 20-40mg ) prescribed? - Yes 5. EF assessed during THIS hospitalization? - Yes 6. For EF <40%, was ACEI/ARB prescribed? - No - Reason:  allergic 7. For EF <40%, Aldosterone Antagonist (Spironolactone or Eplerenone) prescribed? - No - Reason:  consider as an outpatient 8. Cardiac Rehab Phase II ordered (Included Medically managed Patients)? - Yes     Outstanding Labs/Studies   N/a  Duration of Discharge Encounter   Greater than 30 minutes including physician time.  Signed, Laverda Page, NP 08/19/2018, 6:09 PM  Patient seen and examined earlier.   Agree with plans as noted   Stable for discharge  Dietrich Pates

## 2018-08-19 NOTE — Evaluation (Signed)
Physical Therapy Evaluation/ Discharge Patient Details Name: Ryan Macdonald MRN: [id number] DOB: 04/06/1934 Today's Date: 08/19/2018   History of Present Illness  84 y/o male presenting with worsening heart failure. Underwent cardiac cath 9/20. PMHx: gout, PAF, HFrEF, type 2 DM, HTN, HLD, Renal insufficiency.   Clinical Impression  Pt pleasant and familiar from prior admissions. Pt able to perform all transfers with increased time without assist, walked with walker in hall with self posterior to walker and flexed trunk which is baseline for pt. He even ascended 4 stairs with rails with cues despite right knee pain. Pt at baseline functional mobility, encouraged continued gait and educated for sequence of stairs with knee pain. No further therapy needs at this time with pt reporting he feels better after having walked.     Follow Up Recommendations No PT follow up    Equipment Recommendations  None recommended by PT    Recommendations for Other Services       Precautions / Restrictions Restrictions RUE Weight Bearing: (RUE )      Mobility  Bed Mobility Overal bed mobility: Modified Independent             General bed mobility comments: use of rail, increased time.   Transfers Overall transfer level: Modified independent Equipment used: Rolling walker (2 wheeled)             General transfer comment: increased time, momentum to power up.   Ambulation/Gait Ambulation/Gait assistance: Modified independent (Device/Increase time) Gait Distance (Feet): 200 Feet Assistive device: Rolling walker (2 wheeled) Gait Pattern/deviations: Step-through pattern;Decreased stride length;Antalgic;Trunk flexed Gait velocity: decreased  Gait velocity interpretation: 1.31 - 2.62 ft/sec, indicative of limited community ambulator General Gait Details: walks with RW anterior, trunk flexed. Slow, no walk and talk, needs to stop. overall steady, complaining of knee pain secondary to chronic  gout.   Stairs Stairs: Yes Stairs assistance: Supervision Stair Management: Two rails Number of Stairs: 4 General stair comments: overall steady, heavy reliance on bil UE to ascend. difficulty with mobility of R knee for step clearance. Cues to go up with the left and down with the right. Pt reports this is opposite of what he does at home, denied further attempts.   Wheelchair Mobility    Modified Rankin (Stroke Patients Only)       Balance Overall balance assessment: Modified Independent(sitting balance UE support, use of RW for standing balance, able to remove hands. )                                           Pertinent Vitals/Pain Pain Assessment: Faces Faces Pain Scale: Hurts even more Pain Location: R knee  Pain Descriptors / Indicators: Constant;Discomfort;Guarding;Grimacing Pain Intervention(s): Patient requesting pain meds-RN notified;Limited activity within patient's tolerance;Monitored during session;Repositioned    Home Living Family/patient expects to be discharged to:: Private residence Living Arrangements: Spouse/significant other Available Help at Discharge: Family;Available 24 hours/day Type of Home: Apartment Home Access: Stairs to enter Entrance Stairs-Rails: Can reach both Entrance Stairs-Number of Steps: 5 Home Layout: One level Home Equipment: Bedside commode;Shower seat;Cane - single point;Grab bars - toilet;Walker - 2 wheels;Wheelchair - manual      Prior Function Level of Independence: Independent with assistive device(s)         Comments: Uses SPC and RW as needed; Pt drives, cooks, Best boy  Extremity/Trunk Assessment   Upper Extremity Assessment Upper Extremity Assessment: Overall WFL for tasks assessed    Lower Extremity Assessment Lower Extremity Assessment: Generalized weakness    Cervical / Trunk Assessment Cervical / Trunk Assessment: Kyphotic  Communication   Communication: No  difficulties  Cognition Arousal/Alertness: Awake/alert Behavior During Therapy: WFL for tasks assessed/performed Overall Cognitive Status: Within Functional Limits for tasks assessed                                        General Comments      Exercises     Assessment/Plan    PT Assessment Patent does not need any further PT services  PT Problem List         PT Treatment Interventions      PT Goals (Current goals can be found in the Care Plan section)  Acute Rehab PT Goals PT Goal Formulation: All assessment and education complete, DC therapy    Frequency     Barriers to discharge        Co-evaluation               AM-PAC PT "6 Clicks" Daily Activity  Outcome Measure Difficulty turning over in bed (including adjusting bedclothes, sheets and blankets)?: A Little Difficulty moving from lying on back to sitting on the side of the bed? : A Little Difficulty sitting down on and standing up from a chair with arms (e.g., wheelchair, bedside commode, etc,.)?: A Little Help needed moving to and from a bed to chair (including a wheelchair)?: None Help needed walking in hospital room?: None Help needed climbing 3-5 steps with a railing? : A Little 6 Click Score: 20    End of Session Equipment Utilized During Treatment: Gait belt Activity Tolerance: Patient tolerated treatment well Patient left: in bed;with call bell/phone within reach;with family/visitor present Nurse Communication: Mobility status PT Visit Diagnosis: Other abnormalities of gait and mobility (R26.89);Muscle weakness (generalized) (M62.81)    Time: 5409-8119 PT Time Calculation (min) (ACUTE ONLY): 37 min   Charges:   PT Evaluation $PT Eval Moderate Complexity: 1 Mod PT Treatments $Gait Training: 8-22 mins        Mccormick Macon Abner Greenspan, PT Acute Rehabilitation Services Pager: [phone] Office: [phone]   Alylah Blakney B Starlyn Droge 08/19/2018, 3:15 PM

## 2018-08-19 NOTE — Progress Notes (Signed)
Pt has been  Seen by PT   They feel he is OK to d/c home   WIll plan on this PM Pt to pick up plavix at pharmacy   Will get future supplies from [facility]

## 2018-08-19 NOTE — Progress Notes (Signed)
CARDIAC REHAB PHASE I   PRE:  Rate/Rhythm: V paced 75  BP:    Sitting: 95/71  Standing: 113/75   SaO2: 98%  MODE:  Ambulation: 15 ft   POST:  Rate/Rhythem: 70  BP:  Supine: 119/71     SaO2: 100% 2lmi  0935-1100 Patient sitting in chair complaining of foot pain, right knee pain and chronic back pain. Patient assisted to the bathroom with rolling walker to the bathroom to have a bowel movement. Patient had dark colored urine upon voiding, possibly blood tinged. Dr Tenny Craw and [facility] RN notified. Naheem was unable to walk any further and was assisted back to bed with call bell within reach. Patient would benefit from home physical therapy due to limited mobility. Reviewed discharge education with patient. Dicussed low na diabetic diet education with the patient. Patient has stent card. Dicussed the importance of taking his clopidogrel daily use of sublingual nitroglycerin and when to call 911.   Thayer Headings RN BSN

## 2018-08-21 ENCOUNTER — Telehealth (HOSPITAL_COMMUNITY): Payer: Self-pay

## 2018-08-21 LAB — GLUCOSE, CAPILLARY: Glucose-Capillary: 104 mg/dL — ABNORMAL HIGH (ref 70–99)

## 2018-08-21 MED FILL — Tirofiban HCl in NaCl 0.9% IV Soln 12.5 MG/250ML (Base Eq): INTRAVENOUS | Qty: 250 | Status: AC

## 2018-08-21 NOTE — Telephone Encounter (Signed)
Referral received and verified for md signature. Patient attends f/u appts through [place]. Attempted to contact patient in regards to interest in participating - lm on vm

## 2018-08-22 ENCOUNTER — Telehealth: Payer: Self-pay | Admitting: Cardiovascular Disease

## 2018-08-22 NOTE — Telephone Encounter (Signed)
Returned call to patient he stated his urine is dark,no burning.Stated he was discharged from [facility] hospital on Friday 9/20.Stated he had a urinalysis but has not received results.Stated if [facility] needs [phone][facility] press [facility] for [facility].Stated he gets medication free from [place].Advised I will send message to Dr.[place]'s nurse.

## 2018-08-22 NOTE — Telephone Encounter (Signed)
Discussed with Dr Duke Salvia and will Rx Cipro 500 mg every 12 hours for 7 days Advised patient, verbalized understanding.  Ask to send overnight

## 2018-08-22 NOTE — Telephone Encounter (Signed)
New Message         Patient is calling today to let Dr Duke Salvia know that he had some procedures at [facility]. He also stated that his urine is dark. Patient would like a call back concerning this matter.

## 2018-08-23 ENCOUNTER — Telehealth: Payer: Self-pay | Admitting: Cardiovascular Disease

## 2018-08-23 MED ORDER — CIPROFLOXACIN HCL 500 MG PO TABS: 500.0000 mg | ORAL_TABLET | Freq: Two times a day (BID) | ORAL | 0 refills | Status: DC

## 2018-08-23 NOTE — Telephone Encounter (Signed)
Follow Up:    Patient is requesting the Rx Cipro 500 mg be faxed to the [place]    Patient stated he has a cough and the mucous is clear

## 2018-08-23 NOTE — Telephone Encounter (Signed)
I will provide him with a prescription for Ciprofloxacin 500mg  bid for the UTI that was discovered while he was in the hospital on our service.  You can use the signature stamp on my cart if you need to.  Coincidentally, this should also work if he has an upper respiratory infection that is bacterial (sounds like it is not).  [facility] without his [place] doctor writing the same prescription.  Any follow up for either his upper respiratory infection or UTI should occur with his PCP, as these are not illness we typically treat in cardiology clinic.  He may want to look into getting a PCP at the [place] [facility] or other nearby [place] if [facility] is not convenient for him to follow up for primary care issues.

## 2018-08-23 NOTE — Telephone Encounter (Signed)
Advised patient, verbalized understanding  

## 2018-08-23 NOTE — Telephone Encounter (Signed)
[facility] and got [facility], faxed to [phone] confirmation received

## 2018-08-23 NOTE — Telephone Encounter (Signed)
[facility] and got [facility], faxed to [phone] confirmation received  

## 2018-08-23 NOTE — Telephone Encounter (Signed)
Pt sts that he has a productive cough with clear mucous. He denies sob, weight gain, orthopnea and reports cold like symptoms. Pt denies any cardiac symptoms. He is wanting Dr.Randoplh to write a script for Cipro and fax it to [facility] [phone]. ([place] clinic #1)  Pt sees Dr.Lambordor @ the [place].  Adv pt several times that he needs to see his pcp for non-cardiac symptoms. He doesn't want to drive to [facility].  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 Adv pt that Dr.Loris is not in the office, I will fwd the message to Dr.Randolp as he requested and we will call back with her response.

## 2018-08-24 ENCOUNTER — Telehealth: Payer: Self-pay | Admitting: Cardiovascular Disease

## 2018-08-24 NOTE — Telephone Encounter (Signed)
Follow up  Pt c/o medication issue:  1. Name of Medication: Ciprofloxacin  2. How are you currently taking this medication (dosage and times per day)?  500 mg  3. Are you having a reaction (difficulty breathing--STAT)? no  4. What is your medication issue? [facility] and he wants it sent before they close today

## 2018-08-24 NOTE — Telephone Encounter (Signed)
Spoke with patient and advised refaxed but did get confirmation yesterday and today, call back if any further issues

## 2018-08-28 ENCOUNTER — Inpatient Hospital Stay (HOSPITAL_COMMUNITY)
Admission: EM | Admit: 2018-08-28 | Discharge: 2018-08-31 | DRG: 291 | Disposition: A | Discharge status: Home Health | Payer: Medicare Other | Attending: Internal Medicine | Admitting: Internal Medicine

## 2018-08-28 ENCOUNTER — Emergency Department (HOSPITAL_COMMUNITY): Payer: Medicare Other

## 2018-08-28 DIAGNOSIS — Z87442 Personal history of urinary calculi: Secondary | ICD-10-CM | POA: Diagnosis not present

## 2018-08-28 DIAGNOSIS — I509 Heart failure, unspecified: Secondary | ICD-10-CM

## 2018-08-28 DIAGNOSIS — I480 Paroxysmal atrial fibrillation: Secondary | ICD-10-CM | POA: Diagnosis present

## 2018-08-28 DIAGNOSIS — Z79899 Other long term (current) drug therapy: Secondary | ICD-10-CM

## 2018-08-28 DIAGNOSIS — Z6838 Body mass index (BMI) 38.0-38.9, adult: Secondary | ICD-10-CM | POA: Diagnosis not present

## 2018-08-28 DIAGNOSIS — I2510 Atherosclerotic heart disease of native coronary artery without angina pectoris: Secondary | ICD-10-CM | POA: Diagnosis present

## 2018-08-28 DIAGNOSIS — N390 Urinary tract infection, site not specified: Secondary | ICD-10-CM | POA: Diagnosis present

## 2018-08-28 DIAGNOSIS — Z8249 Family history of ischemic heart disease and other diseases of the circulatory system: Secondary | ICD-10-CM

## 2018-08-28 DIAGNOSIS — N189 Chronic kidney disease, unspecified: Secondary | ICD-10-CM | POA: Diagnosis present

## 2018-08-28 DIAGNOSIS — N179 Acute kidney failure, unspecified: Secondary | ICD-10-CM | POA: Diagnosis present

## 2018-08-28 DIAGNOSIS — I4891 Unspecified atrial fibrillation: Secondary | ICD-10-CM | POA: Diagnosis present

## 2018-08-28 DIAGNOSIS — E7800 Pure hypercholesterolemia, unspecified: Secondary | ICD-10-CM | POA: Diagnosis present

## 2018-08-28 DIAGNOSIS — Z9861 Coronary angioplasty status: Secondary | ICD-10-CM

## 2018-08-28 DIAGNOSIS — Z95810 Presence of automatic (implantable) cardiac defibrillator: Secondary | ICD-10-CM | POA: Diagnosis not present

## 2018-08-28 DIAGNOSIS — J069 Acute upper respiratory infection, unspecified: Secondary | ICD-10-CM | POA: Diagnosis present

## 2018-08-28 DIAGNOSIS — I5023 Acute on chronic systolic (congestive) heart failure: Secondary | ICD-10-CM | POA: Diagnosis present

## 2018-08-28 DIAGNOSIS — I428 Other cardiomyopathies: Secondary | ICD-10-CM | POA: Diagnosis present

## 2018-08-28 DIAGNOSIS — Z7902 Long term (current) use of antithrombotics/antiplatelets: Secondary | ICD-10-CM | POA: Diagnosis not present

## 2018-08-28 DIAGNOSIS — E1122 Type 2 diabetes mellitus with diabetic chronic kidney disease: Secondary | ICD-10-CM | POA: Diagnosis present

## 2018-08-28 DIAGNOSIS — I130 Hypertensive heart and chronic kidney disease with heart failure and stage 1 through stage 4 chronic kidney disease, or unspecified chronic kidney disease: Principal | ICD-10-CM | POA: Diagnosis present

## 2018-08-28 DIAGNOSIS — Z7982 Long term (current) use of aspirin: Secondary | ICD-10-CM | POA: Diagnosis not present

## 2018-08-28 DIAGNOSIS — I255 Ischemic cardiomyopathy: Secondary | ICD-10-CM | POA: Diagnosis present

## 2018-08-28 DIAGNOSIS — E785 Hyperlipidemia, unspecified: Secondary | ICD-10-CM | POA: Diagnosis present

## 2018-08-28 DIAGNOSIS — R531 Weakness: Secondary | ICD-10-CM | POA: Diagnosis not present

## 2018-08-28 DIAGNOSIS — I5022 Chronic systolic (congestive) heart failure: Secondary | ICD-10-CM | POA: Diagnosis present

## 2018-08-28 DIAGNOSIS — Z7901 Long term (current) use of anticoagulants: Secondary | ICD-10-CM

## 2018-08-28 DIAGNOSIS — E118 Type 2 diabetes mellitus with unspecified complications: Secondary | ICD-10-CM | POA: Diagnosis present

## 2018-08-28 LAB — URINALYSIS, ROUTINE W REFLEX MICROSCOPIC
BILIRUBIN URINE: NEGATIVE
Glucose, UA: NEGATIVE mg/dL
KETONES UR: NEGATIVE mg/dL
NITRITE: NEGATIVE
PH: 5 (ref 5.0–8.0)
Protein, ur: NEGATIVE mg/dL
SPECIFIC GRAVITY, URINE: 1.009 (ref 1.005–1.030)

## 2018-08-28 LAB — BRAIN NATRIURETIC PEPTIDE: B NATRIURETIC PEPTIDE 5: 823 pg/mL — AB (ref 0.0–100.0)

## 2018-08-28 LAB — TROPONIN I
Troponin I: 0.05 ng/mL (ref <0.03)
Troponin I: 0.04 ng/mL (ref <0.03)

## 2018-08-28 LAB — CBC
HEMATOCRIT: 37.7 % — AB (ref 39.0–52.0)
HEMOGLOBIN: 11.6 g/dL — AB (ref 13.0–17.0)
MCH: 30.4 pg (ref 26.0–34.0)
MCHC: 30.8 g/dL (ref 30.0–36.0)
MCV: 99 fL (ref 78.0–100.0)
Platelets: 300 10*3/uL (ref 150–400)
RBC: 3.81 MIL/uL — AB (ref 4.22–5.81)
RDW: 14.6 % (ref 11.5–15.5)
WBC: 9.6 10*3/uL (ref 4.0–10.5)

## 2018-08-28 LAB — BASIC METABOLIC PANEL
Anion gap: 12 (ref 5–15)
BUN: 23 mg/dL (ref 8–23)
CALCIUM: 8.9 mg/dL (ref 8.9–10.3)
CO2: 23 mmol/L (ref 22–32)
Chloride: 105 mmol/L (ref 98–111)
Creatinine, Ser: 1.77 mg/dL — ABNORMAL HIGH (ref 0.61–1.24)
GFR calc Af Amer: 39 mL/min — ABNORMAL LOW (ref >60)
GFR calc non Af Amer: 34 mL/min — ABNORMAL LOW (ref >60)
Glucose, Bld: 136 mg/dL — ABNORMAL HIGH (ref 70–99)
POTASSIUM: 4.5 mmol/L (ref 3.5–5.1)
SODIUM: 140 mmol/L (ref 135–145)

## 2018-08-28 LAB — GLUCOSE, CAPILLARY: Glucose-Capillary: 119 mg/dL — ABNORMAL HIGH (ref 70–99)

## 2018-08-28 LAB — CBG MONITORING, ED: Glucose-Capillary: 126 mg/dL — ABNORMAL HIGH (ref 70–99)

## 2018-08-28 MED ORDER — SODIUM CHLORIDE 0.9% FLUSH: 3.0000 mL | INTRAVENOUS | Status: DC | PRN

## 2018-08-28 MED ORDER — SODIUM CHLORIDE 0.9% FLUSH
3.0000 mL | Freq: Two times a day (BID) | INTRAVENOUS | Status: DC
  Administered 2018-08-29 – 2018-08-31 (×6): 3 mL via INTRAVENOUS

## 2018-08-28 MED ORDER — ACETAMINOPHEN 325 MG PO TABS: 650.0000 mg | ORAL_TABLET | ORAL | Status: DC | PRN

## 2018-08-28 MED ORDER — HYDRALAZINE HCL 10 MG PO TABS
10.0000 mg | ORAL_TABLET | Freq: Three times a day (TID) | ORAL | Status: DC
  Administered 2018-08-29 – 2018-08-31 (×9): 10 mg via ORAL
  Filled 2018-08-28 (×9): qty 1

## 2018-08-28 MED ORDER — CARVEDILOL 12.5 MG PO TABS
6.2500 mg | ORAL_TABLET | Freq: Two times a day (BID) | ORAL | Status: DC
  Administered 2018-08-29 – 2018-08-31 (×5): 6.25 mg via ORAL
  Filled 2018-08-28 (×5): qty 1

## 2018-08-28 MED ORDER — ISOSORBIDE DINITRATE 20 MG PO TABS
20.0000 mg | ORAL_TABLET | Freq: Three times a day (TID) | ORAL | Status: DC
  Administered 2018-08-29 – 2018-08-31 (×8): 20 mg via ORAL
  Filled 2018-08-28: qty 2
  Filled 2018-08-28: qty 1
  Filled 2018-08-28 (×3): qty 2
  Filled 2018-08-28 (×2): qty 1
  Filled 2018-08-28: qty 2
  Filled 2018-08-28: qty 1
  Filled 2018-08-28: qty 2
  Filled 2018-08-28 (×2): qty 1
  Filled 2018-08-28: qty 2
  Filled 2018-08-28: qty 1
  Filled 2018-08-28: qty 2
  Filled 2018-08-28 (×2): qty 1

## 2018-08-28 MED ORDER — AMIODARONE HCL 200 MG PO TABS
200.0000 mg | ORAL_TABLET | Freq: Two times a day (BID) | ORAL | Status: DC
  Administered 2018-08-29 – 2018-08-31 (×6): 200 mg via ORAL
  Filled 2018-08-28 (×6): qty 1

## 2018-08-28 MED ORDER — CIPROFLOXACIN HCL 500 MG PO TABS
500.0000 mg | ORAL_TABLET | Freq: Two times a day (BID) | ORAL | Status: DC
  Administered 2018-08-29 – 2018-08-30 (×5): 500 mg via ORAL
  Filled 2018-08-28 (×5): qty 1

## 2018-08-28 MED ORDER — FUROSEMIDE 10 MG/ML IJ SOLN
40.0000 mg | Freq: Once | INTRAMUSCULAR | Status: AC
  Administered 2018-08-28: 40 mg via INTRAVENOUS
  Filled 2018-08-28: qty 4

## 2018-08-28 MED ORDER — ASPIRIN EC 81 MG PO TBEC
81.0000 mg | DELAYED_RELEASE_TABLET | Freq: Every day | ORAL | Status: DC
  Administered 2018-08-29 – 2018-08-31 (×3): 81 mg via ORAL
  Filled 2018-08-28 (×3): qty 1

## 2018-08-28 MED ORDER — HYDROCODONE-ACETAMINOPHEN 5-325 MG PO TABS: 1.0000 | ORAL_TABLET | Freq: Two times a day (BID) | ORAL | Status: DC | PRN

## 2018-08-28 MED ORDER — SODIUM CHLORIDE 0.9 % IV SOLN: 250.0000 mL | INTRAVENOUS | Status: DC | PRN

## 2018-08-28 MED ORDER — APIXABAN 2.5 MG PO TABS
2.5000 mg | ORAL_TABLET | Freq: Two times a day (BID) | ORAL | Status: DC
  Administered 2018-08-29 (×2): 2.5 mg via ORAL
  Filled 2018-08-28 (×2): qty 1

## 2018-08-28 MED ORDER — ONDANSETRON HCL 4 MG/2ML IJ SOLN: 4.0000 mg | Freq: Four times a day (QID) | INTRAMUSCULAR | Status: DC | PRN

## 2018-08-28 MED ORDER — ALLOPURINOL 100 MG PO TABS
200.0000 mg | ORAL_TABLET | Freq: Every day | ORAL | Status: DC
  Administered 2018-08-29 – 2018-08-31 (×3): 200 mg via ORAL
  Filled 2018-08-28 (×3): qty 2

## 2018-08-28 MED ORDER — FUROSEMIDE 10 MG/ML IJ SOLN
40.0000 mg | Freq: Two times a day (BID) | INTRAMUSCULAR | Status: DC
  Administered 2018-08-29 – 2018-08-30 (×3): 40 mg via INTRAVENOUS
  Filled 2018-08-28 (×3): qty 4

## 2018-08-28 NOTE — ED Notes (Signed)
Admitting MD in to assess pt for admission 

## 2018-08-28 NOTE — Progress Notes (Signed)
ANTICOAGULATION CONSULT NOTE - Initial Consult  Pharmacy Consult for eliquis Indication: atrial fibrillation  Allergies  Allergen Reactions  . Lisinopril Swelling    Facial swelling  . Xarelto [Rivaroxaban] Other (See Comments)    bleeding    Patient Measurements: Height: 5\' 7"  (170.2 cm) Weight: 252 lb 14.4 oz (114.7 kg) IBW/kg (Calculated) : 66.1   Vital Signs: Temp: 98.2 F (36.8 C) (09/30 1440) Temp Source: Oral (09/30 1440) BP: 138/83 (09/30 2300) Pulse Rate: 73 (09/30 2300)  Labs: Recent Labs    08/28/18 1434 08/28/18 2004  HGB 11.6*  --   HCT 37.7*  --   PLT 300  --   CREATININE 1.77*  --   TROPONINI 0.05* 0.04*    Estimated Creatinine Clearance: 38.9 mL/min (A) (by C-G formula based on SCr of 1.77 mg/dL (H)).   Medical History: Past Medical History:  Diagnosis Date  . Atrial fibrillation (HCC)   . Cardiomyopathy (HCC)   . Cat scratch fever    removed mass on right side neck  . Cataracts, bilateral   . CHF (congestive heart failure) (HCC)   . Chronic kidney disease   . Corns and callosities   . Diabetes mellitus without complication (HCC)    TYPE 2  . Dyspnea   . Erythema intertrigo   . Flat foot   . Gout   . High cholesterol   . Hx of urethral stricture   . Hypertension   . Kidney stones   . Morbid obesity (HCC)   . Nonischemic cardiomyopathy (HCC)    a. ? 2009 Cath in [place] - nl cors per pt;  b. 09/2013 Echo: EF 25-30%, sev diff HK.  . Obesity   . Osteoarthritis   . PAF (paroxysmal atrial fibrillation) (HCC)    a. post-op hip in 2014 - prev on xarelto.  . Renal insufficiency     Medications:  Medications Prior to Admission  Medication Sig Dispense Refill Last Dose  . allopurinol (ZYLOPRIM) 100 MG tablet Take 200 mg by mouth daily. To prevent gout   08/28/2018 at am  . amiodarone (PACERONE) 200 MG tablet Take 1 tablet (200 mg total) by mouth 2 (two) times daily. (Patient taking differently: Take 200 mg by mouth 2 (two) times daily. For  heart rhythm control) 60 tablet 0 08/28/2018 at am  . apixaban (ELIQUIS) 2.5 MG TABS tablet Take 1 tablet (2.5 mg total) by mouth 2 (two) times daily. (Patient taking differently: Take 2.5 mg by mouth 2 (two) times daily. Caution - blood thinner) 90 tablet 0 08/28/2018 at 700  . aspirin EC 81 MG EC tablet Take 1 tablet (81 mg total) by mouth daily.   08/28/2018 at am  . carvedilol (COREG) 12.5 MG tablet Take 0.5 tablets (6.25 mg total) by mouth 2 (two) times daily with a meal. (Patient taking differently: Take 12.5 mg by mouth 2 (two) times daily with a meal. ) 30 tablet 0 08/28/2018 at 700  . ciprofloxacin (CILOXAN) 0.3 % ophthalmic solution Place 1 drop into the left eye 4 (four) times daily. Antibiotic eye drops   08/28/2018 at noon  . ciprofloxacin (CIPRO) 500 MG tablet Take 1 tablet (500 mg total) by mouth every 12 (twelve) hours. 14 tablet 0 08/28/2018 at am  . clopidogrel (PLAVIX) 75 MG tablet Take 1 tablet (75 mg total) by mouth daily with breakfast. 30 tablet 0 08/28/2018 at am  . cyanocobalamin 1000 MCG tablet Take 1,000 mcg by mouth daily.    08/28/2018 at  am  . diclofenac sodium (VOLTAREN) 1 % GEL Apply 4 g topically 4 (four) times daily. (Patient taking differently: Apply 4 g topically 4 (four) times daily as needed (pain). ) 1 Tube 0 week ago  . docusate sodium (COLACE) 100 MG capsule Take 1 capsule (100 mg total) by mouth 2 (two) times daily as needed for mild constipation. (Patient taking differently: Take 100 mg by mouth daily. ) 10 capsule 0 08/28/2018 at am  . finasteride (PROSCAR) 5 MG tablet Take 5 mg by mouth daily.    9/30? at am  . glipiZIDE (GLUCOTROL) 5 MG tablet Take 5 mg by mouth daily before breakfast.   08/28/2018 at am  . hydrALAZINE (APRESOLINE) 10 MG tablet Take 1 tablet (10 mg total) by mouth every 8 (eight) hours. (Patient taking differently: Take 10 mg by mouth every 8 (eight) hours. For blood pressure) 90 tablet 0 08/28/2018 at am  . HYDROcodone-acetaminophen (NORCO/VICODIN)  5-325 MG tablet Take 2 tablets by mouth every 4 (four) hours as needed. (Patient taking differently: Take 1 tablet by mouth 2 (two) times daily as needed for moderate pain. ) 6 tablet 0 week ago  . ipratropium-albuterol (DUONEB) 0.5-2.5 (3) MG/3ML SOLN Take 3 mLs by nebulization every 4 (four) hours as needed. (Patient taking differently: Take 3 mLs by nebulization every 4 (four) hours as needed (shortness of breath). ) 360 mL 0 08/27/2018 at Unknown time  . isosorbide dinitrate (ISORDIL) 20 MG tablet Take 20 mg by mouth 3 (three) times daily.   08/28/2018 at am  . ketoconazole (NIZORAL) 2 % shampoo Apply 1 application topically 2 (two) times a week.   few days ago  . magnesium oxide (MAG-OX) 400 MG tablet Take 400 mg by mouth daily.   08/28/2018 at am  . nitroGLYCERIN (NITROSTAT) 0.4 MG SL tablet Place 1 tablet (0.4 mg total) under the tongue every 5 (five) minutes x 3 doses as needed for chest pain. 25 tablet 0 never taken  . potassium chloride SA (K-DUR,KLOR-CON) 20 MEQ tablet Take 20 mEq by mouth daily.   08/28/2018 at am  . prednisoLONE acetate (PRED FORTE) 1 % ophthalmic suspension Place 1 drop into the left eye 4 (four) times daily.   08/28/2018 at noon  . PRESCRIPTION MEDICATION Apply 1 application topically daily as needed (pain). ThermaGel   week ago  . sertraline (ZOLOFT) 25 MG tablet Take 25-50 mg by mouth daily as needed (for anxiety).    week ago?  . tamsulosin (FLOMAX) 0.4 MG CAPS capsule Take 0.4 mg by mouth at bedtime.   9/29? at pm  . torsemide (DEMADEX) 20 MG tablet Take one 20mg  tablet daily.  However, if weight up 3lbs in 24 hours take an extra dose that day.  If weight is below your dry weight skip your dose that day. (Patient taking differently: Take 20 mg by mouth See admin instructions. Take one 20mg  tablet daily.  However, if weight up 3lbs in 24 hours take an extra dose that day.  If weight is below your dry weight skip your dose that day.) 120 tablet 3 08/28/2018 at am  .  fluticasone furoate-vilanterol (BREO ELLIPTA) 100-25 MCG/INH AEPB Inhale 1 puff into the lungs daily. (Patient not taking: Reported on 08/14/2018) 60 each 3 Not Taking at Unknown time  . isosorbide mononitrate (IMDUR) 30 MG 24 hr tablet Take 1 tablet (30 mg total) by mouth daily. (Patient not taking: Reported on 08/28/2018) 30 tablet 0 Not Taking at Unknown time  .  rosuvastatin (CRESTOR) 20 MG tablet Take 1 tablet (20 mg total) by mouth daily at 6 PM. (Patient not taking: Reported on 08/14/2018) 90 tablet 2 Not Taking at Unknown time    Assessment: 84 yo man to continue eliquis treatment for afib.  Last dose on eliquis 2.5 mg this morning. Goal of Therapy:  Therapeutic anticoagulation Monitor platelets by anticoagulation protocol: Yes   Plan:  Eliquis 2.5 mg po bid Monitor for bleeding complications  Talbert Cage Poteet 08/28/2018,11:44 PM

## 2018-08-28 NOTE — H&P (Addendum)
Cardiology History & Physical    Patient ID: Ryan Macdonald MRN: [id number], DOB: [birth year] Date of Encounter: 08/28/2018, 10:00 PM Primary Physician: [facility] Primary Cardiologist: [name], MD Primary Electrophysiologist:  None  Chief Complaint: Cough Reason for Admission: Acute on chronic systolic heart failure  Requesting MD: Virgina Norfolk MD   HPI: Ryan Macdonald is a 84 y.o. male with history of non-ischemic cardiomyopathy (LVEF 20%) status-post Biotronik single-chamber ICD (with floating atrial electrodes on Linox Smart DX ventricular lead), paroxysmal atrial fibrillation on apixiban, type 2 diabetes mellitus, hypertension, hyperlipidemia, who presents with acute on chronic systolic heart failure. He presented to the emergency department with approximately 24 hours of significant cough productive of clear sputum and generalized malaise in addition to lower extremity edema. No recent dietary indiscretion. He continues to take torsemide in the morning after weighing himself and has not increased his dose due to stable weight at 248 pounds. No fevers, chills, nausea, vomiting, diarrhea, constipation.  The cough is intermittent. It is accompanied by sneezing after he coughs. There are no significant alleviating factors. He is currently on ciprofloxacin for urinary tract infection. Serum creatinine is 1.77 from 1.43.   Past Medical History:  Diagnosis Date  . Atrial fibrillation (HCC)   . Cardiomyopathy (HCC)   . Cat scratch fever    removed mass on right side neck  . Cataracts, bilateral   . CHF (congestive heart failure) (HCC)   . Chronic kidney disease   . Corns and callosities   . Diabetes mellitus without complication (HCC)    TYPE 2  . Dyspnea   . Erythema intertrigo   . Flat foot   . Gout   . High cholesterol   . Hx of urethral stricture   . Hypertension   . Kidney stones   . Morbid obesity (HCC)   . Nonischemic cardiomyopathy (HCC)    a. ?  2009 Cath in [place] - nl cors per pt;  b. 09/2013 Echo: EF 25-30%, sev diff HK.  . Obesity   . Osteoarthritis   . PAF (paroxysmal atrial fibrillation) (HCC)    a. post-op hip in 2014 - prev on xarelto.  . Renal insufficiency      Surgical History:  Past Surgical History:  Procedure Laterality Date  . CARDIAC CATHETERIZATION  1980's  . CIRCUMCISION  1940's  . CORONARY STENT INTERVENTION N/A 08/18/2018   Procedure: CORONARY STENT INTERVENTION;  Surgeon: Corky Crafts, MD;  [facility] INVASIVE CV LAB;  Service: Cardiovascular;  Laterality: N/A;  . CYSTOSCOPY WITH URETHRAL DILATATION  10/23/2013  . CYSTOSCOPY WITH URETHRAL DILATATION N/A 10/23/2013   Procedure: CYSTOSCOPY WITH URETHRAL DILATATION;  Surgeon: Kathi Ludwig, MD;  Location: [facility] OR;  Service: Urology;  Laterality: N/A;  . INCISION AND DRAINAGE OF WOUND Right 1980's   "cat scratch" (10/23/2013)  . INGUINAL HERNIA REPAIR Right 1950's  . INTRAVASCULAR ULTRASOUND/IVUS N/A 08/18/2018   Procedure: Intravascular Ultrasound/IVUS;  Surgeon: Corky Crafts, MD;  Location: [facility] INVASIVE CV LAB;  Service: Cardiovascular;  Laterality: N/A;  . KIDNEY STONE SURGERY Right 1970's; 1983   "twice"  . LEFT HEART CATH AND CORONARY ANGIOGRAPHY N/A 08/18/2018   Procedure: LEFT HEART CATH AND CORONARY ANGIOGRAPHY;  Surgeon: Corky Crafts, MD;  Location: [facility] INVASIVE CV LAB;  Service: Cardiovascular;  Laterality: N/A;  . TONSILLECTOMY AND ADENOIDECTOMY  1940's  . TOTAL HIP ARTHROPLASTY Left 10/23/2013   Procedure: TOTAL HIP ARTHROPLASTY;  Surgeon: Valeria Batman, MD;  Location:  [facility] OR;  Service: Orthopedics;  Laterality: Left;     Home Meds: Prior to Admission medications   Medication Sig Start Date End Date Taking? Authorizing Provider  allopurinol (ZYLOPRIM) 100 MG tablet Take 200 mg by mouth daily.   Yes [provider]  amiodarone (PACERONE) 200 MG tablet Take 1 tablet (200 mg total) by mouth 2 (two) times daily.  02/16/18  Yes Santos-Sanchez, Chelsea Primus, MD  apixaban (ELIQUIS) 2.5 MG TABS tablet Take 1 tablet (2.5 mg total) by mouth 2 (two) times daily. 05/04/18  Yes Angelita Ingles, MD  aspirin EC 81 MG EC tablet Take 1 tablet (81 mg total) by mouth daily. 08/20/18  Yes Arty Baumgartner, NP  carvedilol (COREG) 12.5 MG tablet Take 0.5 tablets (6.25 mg total) by mouth 2 (two) times daily with a meal. 04/15/17  Yes Osvaldo Shipper, MD  ciprofloxacin (CILOXAN) 0.3 % ophthalmic solution Place 1 drop into the left eye 4 (four) times daily.    Yes [provider]  ciprofloxacin (CIPRO) 500 MG tablet Take 1 tablet (500 mg total) by mouth every 12 (twelve) hours. 08/23/18  Yes [name], MD  clopidogrel (PLAVIX) 75 MG tablet Take 1 tablet (75 mg total) by mouth daily with breakfast. 08/20/18  Yes Laverda Page B, NP  cyanocobalamin 1000 MCG tablet Take 1,000 mcg by mouth daily.    Yes [provider]  diclofenac sodium (VOLTAREN) 1 % GEL Apply 4 g topically 4 (four) times daily. Patient taking differently: Apply 4 g topically 4 (four) times daily as needed (pain).  07/10/17  Yes Valentino Nose, MD  docusate sodium (COLACE) 100 MG capsule Take 1 capsule (100 mg total) by mouth 2 (two) times daily as needed for mild constipation. Patient taking differently: Take 100 mg by mouth daily.  07/20/18  Yes Barrett, Joline Salt, PA-C  finasteride (PROSCAR) 5 MG tablet Take 5 mg by mouth daily.    Yes [provider]  glipiZIDE (GLUCOTROL) 5 MG tablet Take 5 mg by mouth daily before breakfast.   Yes [provider]  fluticasone furoate-vilanterol (BREO ELLIPTA) 100-25 MCG/INH AEPB Inhale 1 puff into the lungs daily. Patient not taking: Reported on 08/14/2018 05/05/18   Angelita Ingles, MD  hydrALAZINE (APRESOLINE) 10 MG tablet Take 1 tablet (10 mg total) by mouth every 8 (eight) hours. 08/19/18   Arty Baumgartner, NP  HYDROcodone-acetaminophen (NORCO/VICODIN) 5-325 MG tablet Take 2 tablets  by mouth every 4 (four) hours as needed. Patient taking differently: Take 1 tablet by mouth 2 (two) times daily as needed for moderate pain.  04/09/16   Isa Rankin, MD  HYDROXYZINE HCL PO Take 1 tablet by mouth at bedtime as needed (sleep).    [provider]  ipratropium-albuterol (DUONEB) 0.5-2.5 (3) MG/3ML SOLN Take 3 mLs by nebulization every 4 (four) hours as needed. Patient taking differently: Take 3 mLs by nebulization every 4 (four) hours as needed (shortness of breath).  05/14/17   Pearson Grippe, MD  isosorbide mononitrate (IMDUR) 30 MG 24 hr tablet Take 1 tablet (30 mg total) by mouth daily. 08/20/18   Arty Baumgartner, NP  ketoconazole (NIZORAL) 2 % shampoo Apply 1 application topically 2 (two) times a week.    [provider]  nitroGLYCERIN (NITROSTAT) 0.4 MG SL tablet Place 1 tablet (0.4 mg total) under the tongue every 5 (five) minutes x 3 doses as needed for chest pain. 08/19/18   Arty Baumgartner, NP  prednisoLONE acetate (PRED FORTE) 1 %  ophthalmic suspension Place 1 drop into the left eye 4 (four) times daily.    [provider]  PRESCRIPTION MEDICATION Apply 1 application topically daily as needed (pain). ThermaGel    [provider]  rosuvastatin (CRESTOR) 20 MG tablet Take 1 tablet (20 mg total) by mouth daily at 6 PM. Patient not taking: Reported on 08/14/2018 05/04/18   Angelita Ingles, MD  sertraline (ZOLOFT) 25 MG tablet Take 25-50 mg by mouth daily as needed (for anxiety).     [provider]  tamsulosin (FLOMAX) 0.4 MG CAPS capsule Take 0.4 mg by mouth at bedtime.    [provider]  torsemide (DEMADEX) 20 MG tablet Take one 20mg  tablet daily.  However, if weight up 3lbs in 24 hours take an extra dose that day.  If weight is below your dry weight skip your dose that day. 07/20/18   Barrett, Joline Salt, PA-C    Allergies:  Allergies  Allergen Reactions  . Lisinopril Swelling    Facial swelling  . Xarelto [Rivaroxaban]  Other (See Comments)    bleeding    Social History   Socioeconomic History  . Marital status: Married    Spouse name: Not on file  . Number of children: Not on file  . Years of education: Not on file  . Highest education level: Not on file  Occupational History  . Occupation: Retired    Comment: Former Patent examiner  . Financial resource strain: Not on file  . Food insecurity:    Worry: Not on file    Inability: Not on file  . Transportation needs:    Medical: Not on file    Non-medical: Not on file  Tobacco Use  . Smoking status: Former Smoker    Packs/day: 1.00    Years: 15.00    Pack years: 15.00    Types: Cigarettes, Cigars  . Smokeless tobacco: Former Neurosurgeon  . Tobacco comment: quit smoking ~ 50 yr ago  Substance and Sexual Activity  . Alcohol use: No    Alcohol/week: 2.0 standard drinks    Types: 2 Cans of beer per week    Comment: rare beer.  . Drug use: No  . Sexual activity: Yes  Lifestyle  . Physical activity:    Days per week: Not on file    Minutes per session: Not on file  . Stress: Not on file  Relationships  . Social connections:    Talks on phone: Not on file    Gets together: Not on file    Attends religious service: Not on file    Active member of club or organization: Not on file    Attends meetings of clubs or organizations: Not on file    Relationship status: Not on file  . Intimate partner violence:    Fear of current or ex partner: Not on file    Emotionally abused: Not on file    Physically abused: Not on file    Forced sexual activity: Not on file  Other Topics Concern  . Not on file  Social History Narrative   Lives in [place] with wife. Moved from [place] (2013). Does not routinely exercise.     Family History  Problem Relation Age of Onset  . Heart disease Mother   . Heart Problems Maternal Grandmother   . Other Unknown        negative for premature CAD    Review of Systems: General: negative for chills, fever,  night sweats or weight changes.  Cardiovascular: negative for chest pain, edema, orthopnea, palpitations, paroxysmal nocturnal dyspnea, shortness of breath or dyspnea on exertion Dermatological: negative for rash Respiratory: negative for cough or wheezing Urologic: negative for hematuria, dysuria, frequency Abdominal: negative for nausea, vomiting, diarrhea, bright red blood per rectum, melena, or hematemesis Neurologic: negative for visual changes, syncope, or dizziness All other systems reviewed and are otherwise negative except as noted above.  Labs:   Lab Results  Component Value Date   WBC 9.6 08/28/2018   HGB 11.6 (L) 08/28/2018   HCT 37.7 (L) 08/28/2018   MCV 99.0 08/28/2018   PLT 300 08/28/2018    Recent Labs  Lab 08/28/18 1434  NA 140  K 4.5  CL 105  CO2 23  BUN 23  CREATININE 1.77*  CALCIUM 8.9  GLUCOSE 136*   Recent Labs    08/28/18 1434 08/28/18 2004  TROPONINI 0.05* 0.04*   Lab Results  Component Value Date   CHOL 159 08/15/2018   HDL 32 (L) 08/15/2018   LDLCALC 109 (H) 08/15/2018   TRIG 89 08/15/2018   Lab Results  Component Value Date   DDIMER 0.98 (H) 02/13/2018    Radiology/Studies:  Dg Chest 2 View  Result Date: 07/31/2018 CLINICAL DATA:  Shortness of breath.  History of CHF. EXAM: CHEST - 2 VIEW COMPARISON:  July 28, 2018 FINDINGS: Stable cardiomegaly and AICD device. The hila and mediastinum are normal. No pneumothorax. No pulmonary nodules, masses, focal infiltrates, or overt edema. IMPRESSION: Cardiomegaly.  No overt pulmonary edema identified. Electronically Signed   By: Gerome Sam III M.D   On: 07/31/2018 23:59   Nm Myocar Multi W/spect Izetta Dakin Motion / Ef  Result Date: 08/16/2018 CLINICAL DATA:  Shortness of breath. Congestive heart failure. Atrial fibrillation. Diabetes, hypertension, and hypercholesterolemia. EXAM: MYOCARDIAL IMAGING WITH SPECT (REST AND PHARMACOLOGIC-STRESS) GATED LEFT VENTRICULAR WALL MOTION STUDY LEFT  VENTRICULAR EJECTION FRACTION TECHNIQUE: Standard myocardial SPECT imaging was performed after resting intravenous injection of 10 mCi Tc-67m tetrofosmin. Subsequently, intravenous infusion of Lexiscan was performed under the supervision of the Cardiology staff. At peak effect of the drug, 30 mCi Tc-67m tetrofosmin was injected intravenously and standard myocardial SPECT imaging was performed. Quantitative gated imaging was also performed to evaluate left ventricular wall motion, and estimate left ventricular ejection fraction. COMPARISON:  None. FINDINGS: Perfusion: Large fixed myocardial perfusion defect is seen on both the stress and resting images involving the lateral and inferior walls of the left ventricle. A moderate reversible myocardial perfusion defect is seen involving the distal anteroseptal and apical walls, suspicious for inducible myocardial ischemia. Wall Motion: Mild left ventricular dilatation. Hypokinesis involving the inferior and lateral walls. Left Ventricular Ejection Fraction: 23 % End diastolic volume 185 ml End systolic volume 54 ml IMPRESSION: 1. Moderate reversible perfusion defect involving the distal anteroseptal and apical walls, suspicious for inducible myocardial ischemia. Large myocardial infarct involving the lateral an inferior walls. 2. Mild left ventricular dilatation, with inferior and lateral wall hypokinesis. 3. Left ventricular ejection fraction 23% 4. Non invasive risk stratification*: High *2012 Appropriate Use Criteria for Coronary Revascularization Focused Update: J Am Coll Cardiol. 2012;59(9):857-881. http://content.dementiazones.com.aspx?articleid=[email or web address] Electronically Signed   By: Myles Rosenthal M.D.   On: 08/16/2018 15:17   Dg Chest Port 1 View  Result Date: 08/28/2018 CLINICAL DATA:  Patient which generalized weakness. EXAM: PORTABLE CHEST 1 VIEW COMPARISON:  Chest radiograph 08/14/2018 FINDINGS: Single lead AICD device overlies the left hemithorax. Stable  cardiomegaly. Bilateral interstitial pulmonary opacities, left-greater-than-right.  No pleural effusion. Thoracic spine degenerative changes. IMPRESSION: Cardiomegaly with mild interstitial edema. Electronically Signed   By: Annia Belt M.D.   On: 08/28/2018 15:03   Dg Chest Port 1 View  Result Date: 08/14/2018 CLINICAL DATA:  Respiratory distress EXAM: PORTABLE CHEST 1 VIEW COMPARISON:  07/31/2018 FINDINGS: Single chamber ICD lead into the right ventricle. There is cardiomegaly and aortic tortuosity. Cardiomediastinal contours are distorted by rightward rotation. Vascular congestion without Kerley lines or air bronchogram. No pneumothorax. IMPRESSION: Cardiomegaly and vascular congestion. Electronically Signed   By: Marnee Spring M.D.   On: 08/14/2018 07:18   Wt Readings from Last 3 Encounters:  08/19/18 113.9 kg  08/08/18 113.3 kg  07/31/18 113.9 kg    EKG: A-sensed RV paced at 70 bpm with wide QRSd  Physical Exam: Blood pressure 116/83, pulse 68, temperature 98.2 F (36.8 C), temperature source Oral, resp. rate (!) 23, SpO2 100 %. There is no height or weight on file to calculate BMI. General: Well developed, well nourished, in no acute distress. Head: Normocephalic, atraumatic, sclera non-icteric, no xanthomas, nares are without discharge.  Neck: Negative for carotid bruits. JVD not visible on right side due to scar. JVP 8 cm on left side.  Lungs: Bibasilar rales. Breathing is mildly labored. The patient is on supplemental oxygen.  Heart: RRR with S1 S2. Grade 3/6 holosysiolic apical murmur.  Abdomen: Soft, non-tender, non-distended with normoactive bowel sounds. No hepatomegaly. No rebound/guarding. No obvious abdominal masses. Msk:  Strength and tone appear normal for age. Extremities: No clubbing or cyanosis. 2+ symmetric edema.  Distal pedal pulses are 2+ and equal bilaterally. Neuro: Alert and oriented X 3. No focal deficit. No facial asymmetry. Moves all extremities  spontaneously. Psych:  Responds to questions appropriately with a normal affect.    Assessment and Plan  Ryan Macdonald is a 84 y.o. male with history of non-ischemic cardiomyopathy (LVEF 20%) status-post Biotronik single-chamber ICD (with floating atrial electrodes on Linox Smart DX ventricular lead), paroxysmal atrial fibrillation on apixiban, type 2 diabetes mellitus, hypertension, hyperlipidemia, who presents with acute on chronic systolic heart failure.  1. Acute on chronic systolic heart failure due to non-ischemic cardiomyopathy. Symptoms include worsening fatigue, cough, and dyspnea. He presents with brain natriuretic peptic, elevated from prior baseline, chest x-ray with pulmonary edema. JVP and crackles on examination. This all seems consistent with an exacerbation of his chronic systolic heart failure.   - Continue furosemide 40 mg IV BID, goal net negative 1-2L -  Considering his CHF I would interrogate his device tomorrow; if he is RV pacing quite frequently (>20%) with a wide QRSd, he definitely should be considered for a CRT upgrade. His device [facility].  -  Continue carvedilol 6.25 mg twice daily -  Continue isosorbide dinitrate/hydralazine -  Not a candidate for ACE or ARB secondary to allergy  2. Atrial fibrillation, paroxysmal. CHA2DS2VASC = 5. - Continue apixiban  - Continue amiodarone 200 mg po bid   3. DM2 - Hold home glipizide - SSI + accuchecks  4. UTI - Continue ciprofloxacin   5. Acute on chronic kidney injury - sCr 1.7 from baseline 1.4-1.6, renal venous congestion is suspected and will monitor response to diuresis    Severity of Illness: The appropriate patient status for this patient is INPATIENT. Inpatient status is judged to be reasonable and necessary in order to provide the required intensity of service to ensure the patient's safety. The patient's presenting symptoms, physical exam findings, and initial radiographic  and laboratory data  in the context of their chronic comorbidities is felt to place them at high risk for further clinical deterioration. Furthermore, it is not anticipated that the patient will be medically stable for discharge from the hospital within 2 midnights of admission. The following factors support the patient status of inpatient.   " The patient's presenting symptoms include lower extremity swelling, cough. " The worrisome physical exam findings include bibasilar rales. " The initial radiographic and laboratory data are worrisome because of elevated brain natriuretic peptide. " The chronic co-morbidities include atrial fibrillation, non-ischemic cardiomyopathy, type 2 diabetes mellitus.   * I certify that at the point of admission it is my clinical judgment that the patient will require inpatient hospital care spanning beyond 2 midnights from the point of admission due to high intensity of service, high risk for further deterioration and high frequency of surveillance required.*    For questions or updates, please contact [facility] Please consult [email or web address] for contact info under Cardiology/STEMI.  Signed, Laverda Page, MD 08/28/2018, 10:00 PM

## 2018-08-28 NOTE — ED Provider Notes (Signed)
[name] EMERGENCY DEPARTMENT Provider Note   CSN: [id number] Arrival date & time: 08/28/18  1424     History   Chief Complaint Chief Complaint  Patient presents with  . Weakness    HPI Ryan Macdonald is a 84 y.o. male.  84 year old male brought in by EMS from home with complaint of cough.  Patient states that he has had a cough for about 3 weeks, states he has coughing and sneezing spells.  Patient reports feeling slightly short of breath, denies difficulty breathing.  Patient has a history of congestive heart failure, states that he weighs himself daily and denies any changes in his weight recently, manages with additional fluid pills as needed.  Also reports history of diabetes, A. fib, takes Plavix, Eliquis, aspirin.  Patient states he was in the hospital on September 21 for a heart cath with stent placement, while in the hospital was told he had a urinary tract infection has been taking antibiotics.  Cough is productive- clear/white mucous. Denies chest pain, fevers, chills, sweats, denies sinus congestion.  No other complaints or concerns.     Past Medical History:  Diagnosis Date  . Atrial fibrillation (HCC)   . Cardiomyopathy (HCC)   . Cat scratch fever    removed mass on right side neck  . Cataracts, bilateral   . CHF (congestive heart failure) (HCC)   . Chronic kidney disease   . Corns and callosities   . Diabetes mellitus without complication (HCC)    TYPE 2  . Dyspnea   . Erythema intertrigo   . Flat foot   . Gout   . High cholesterol   . Hx of urethral stricture   . Hypertension   . Kidney stones   . Morbid obesity (HCC)   . Nonischemic cardiomyopathy (HCC)    a. ? 2009 Cath in [place] - nl cors per pt;  b. 09/2013 Echo: EF 25-30%, sev diff HK.  . Obesity   . Osteoarthritis   . PAF (paroxysmal atrial fibrillation) (HCC)    a. post-op hip in 2014 - prev on xarelto.  . Renal insufficiency     Patient Active Problem List   Diagnosis  Date Noted  . CHF (congestive heart failure) (HCC) 08/14/2018  . Acute on chronic combined systolic and diastolic congestive heart failure (HCC) 07/18/2018  . First degree AV block   . Shortness of breath 04/29/2018  . Acute HFrEF (heart failure with reduced ejection fraction) (HCC) 04/29/2018  . Acute respiratory failure with hypoxia (HCC) 09/27/2017  . CHF exacerbation (HCC) 09/27/2017  . Renal insufficiency   . Acute on chronic systolic CHF (congestive heart failure) (HCC) 07/08/2017  . Left knee pain 07/08/2017  . Acute bronchitis 05/10/2017  . Acute congestive heart failure (HCC) 04/14/2017  . Elevated troponin 04/14/2017  . Lactic acidosis   . Acute on chronic systolic heart failure, NYHA class 2 (HCC) 02/03/2017  . Diabetes mellitus type 2 in obese (HCC) 02/03/2017  . CAP (community acquired pneumonia) 02/03/2017  . Lobar pneumonia (HCC)   . Diabetes mellitus with complication (HCC)   . Acute on chronic respiratory failure with hypoxia (HCC)   . Acute on chronic combined systolic and diastolic heart failure (HCC) 05/19/2016  . Depression 05/19/2016  . Gout 05/19/2016  . 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 NSVT (nonsustained ventricular tachycardia) (HCC)   . Chronic renal insufficiency, stage 3 (moderate) (HCC)   . Pulmonary embolism (HCC)   . Acute respiratory failure (  HCC)   . Osteoarthritis of left hip 10/25/2013  . Atrial fibrillation (HCC) 10/25/2013  . S/P hip replacement 10/24/2013  . Nonischemic cardiomyopathy (HCC)   . Obesity (BMI 30-39.9)   . Essential hypertension   . Hyperlipidemia     Past Surgical History:  Procedure Laterality Date  . CARDIAC CATHETERIZATION  1980's  . CIRCUMCISION  1940's  . CORONARY STENT INTERVENTION N/A 08/18/2018   Procedure: CORONARY STENT INTERVENTION;  Surgeon: Corky Crafts, MD;  Location: [facility] INVASIVE CV LAB;  Service: Cardiovascular;  Laterality: N/A;  . CYSTOSCOPY WITH URETHRAL  DILATATION  10/23/2013  . CYSTOSCOPY WITH URETHRAL DILATATION N/A 10/23/2013   Procedure: CYSTOSCOPY WITH URETHRAL DILATATION;  Surgeon: Kathi Ludwig, MD;  Location: [facility] OR;  Service: Urology;  Laterality: N/A;  . INCISION AND DRAINAGE OF WOUND Right 1980's   "cat scratch" (10/23/2013)  . INGUINAL HERNIA REPAIR Right 1950's  . INTRAVASCULAR ULTRASOUND/IVUS N/A 08/18/2018   Procedure: Intravascular Ultrasound/IVUS;  Surgeon: Corky Crafts, MD;  Location: [facility] INVASIVE CV LAB;  Service: Cardiovascular;  Laterality: N/A;  . KIDNEY STONE SURGERY Right 1970's; 1983   "twice"  . LEFT HEART CATH AND CORONARY ANGIOGRAPHY N/A 08/18/2018   Procedure: LEFT HEART CATH AND CORONARY ANGIOGRAPHY;  Surgeon: Corky Crafts, MD;  Location: [facility] INVASIVE CV LAB;  Service: Cardiovascular;  Laterality: N/A;  . TONSILLECTOMY AND ADENOIDECTOMY  1940's  . TOTAL HIP ARTHROPLASTY Left 10/23/2013   Procedure: TOTAL HIP ARTHROPLASTY;  Surgeon: Valeria Batman, MD;  Location: [facility] OR;  Service: Orthopedics;  Laterality: Left;        Home Medications    Prior to Admission medications   Medication Sig Start Date End Date Taking? Authorizing Provider  allopurinol (ZYLOPRIM) 100 MG tablet Take 200 mg by mouth daily.    [provider]  amiodarone (PACERONE) 200 MG tablet Take 1 tablet (200 mg total) by mouth 2 (two) times daily. 02/16/18   Santos-Sanchez, Chelsea Primus, MD  apixaban (ELIQUIS) 2.5 MG TABS tablet Take 1 tablet (2.5 mg total) by mouth 2 (two) times daily. 05/04/18   Angelita Ingles, MD  aspirin EC 81 MG EC tablet Take 1 tablet (81 mg total) by mouth daily. 08/20/18   Arty Baumgartner, NP  carvedilol (COREG) 12.5 MG tablet Take 0.5 tablets (6.25 mg total) by mouth 2 (two) times daily with a meal. 04/15/17   Osvaldo Shipper, MD  ciprofloxacin (CILOXAN) 0.3 % ophthalmic solution Place 1 drop into the left eye 4 (four) times daily. Administer 1 drop, every 2 hours, while awake, for 2 days. Then  1 drop, every 4 hours, while awake, for the next 5 days.    [provider]  ciprofloxacin (CIPRO) 500 MG tablet Take 1 tablet (500 mg total) by mouth every 12 (twelve) hours. 08/23/18   [name], MD  clopidogrel (PLAVIX) 75 MG tablet Take 1 tablet (75 mg total) by mouth daily with breakfast. 08/20/18   Laverda Page B, NP  cyanocobalamin 1000 MCG tablet Take 1,000 mcg by mouth daily.     [provider]  diclofenac sodium (VOLTAREN) 1 % GEL Apply 4 g topically 4 (four) times daily. Patient taking differently: Apply 4 g topically 4 (four) times daily as needed (pain).  07/10/17   Valentino Nose, MD  docusate sodium (COLACE) 100 MG capsule Take 1 capsule (100 mg total) by mouth 2 (two) times daily as needed for mild constipation. 07/20/18   Barrett, Joline Salt, PA-C  finasteride (PROSCAR) 5 MG  tablet Take 5 mg by mouth daily as needed (unk).     [provider]  fluticasone furoate-vilanterol (BREO ELLIPTA) 100-25 MCG/INH AEPB Inhale 1 puff into the lungs daily. Patient not taking: Reported on 08/14/2018 05/05/18   Angelita Ingles, MD  glipiZIDE (GLUCOTROL) 5 MG tablet Take 5 mg by mouth daily before breakfast.    [provider]  hydrALAZINE (APRESOLINE) 10 MG tablet Take 1 tablet (10 mg total) by mouth every 8 (eight) hours. 08/19/18   Arty Baumgartner, NP  HYDROcodone-acetaminophen (NORCO/VICODIN) 5-325 MG tablet Take 2 tablets by mouth every 4 (four) hours as needed. Patient taking differently: Take 1 tablet by mouth 2 (two) times daily as needed for moderate pain.  04/09/16   Isa Rankin, MD  HYDROXYZINE HCL PO Take 1 tablet by mouth at bedtime as needed (sleep).    [provider]  ipratropium-albuterol (DUONEB) 0.5-2.5 (3) MG/3ML SOLN Take 3 mLs by nebulization every 4 (four) hours as needed. Patient taking differently: Take 3 mLs by nebulization every 4 (four) hours as needed (shortness of breath).  05/14/17   Pearson Grippe, MD  isosorbide  mononitrate (IMDUR) 30 MG 24 hr tablet Take 1 tablet (30 mg total) by mouth daily. 08/20/18   Arty Baumgartner, NP  ketoconazole (NIZORAL) 2 % shampoo Apply 1 application topically 2 (two) times a week.    [provider]  nitroGLYCERIN (NITROSTAT) 0.4 MG SL tablet Place 1 tablet (0.4 mg total) under the tongue every 5 (five) minutes x 3 doses as needed for chest pain. 08/19/18   Arty Baumgartner, NP  prednisoLONE acetate (PRED FORTE) 1 % ophthalmic suspension Place 1 drop into the left eye 4 (four) times daily.    [provider]  PRESCRIPTION MEDICATION Apply 1 application topically daily as needed (pain). ThermaGel    [provider]  rosuvastatin (CRESTOR) 20 MG tablet Take 1 tablet (20 mg total) by mouth daily at 6 PM. Patient not taking: Reported on 08/14/2018 05/04/18   Angelita Ingles, MD  sertraline (ZOLOFT) 25 MG tablet Take 25-50 mg by mouth daily as needed (for anxiety).     [provider]  tamsulosin (FLOMAX) 0.4 MG CAPS capsule Take 0.4 mg by mouth at bedtime.    [provider]  torsemide (DEMADEX) 20 MG tablet Take one 20mg  tablet daily.  However, if weight up 3lbs in 24 hours take an extra dose that day.  If weight is below your dry weight skip your dose that day. 07/20/18   Barrett, Joline Salt, PA-C    Family History Family History  Problem Relation Age of Onset  . Heart disease Mother   . Heart Problems Maternal Grandmother   . Other Unknown        negative for premature CAD    Social History Social History   Tobacco Use  . Smoking status: Former Smoker    Packs/day: 1.00    Years: 15.00    Pack years: 15.00    Types: Cigarettes, Cigars  . Smokeless tobacco: Former Neurosurgeon  . Tobacco comment: quit smoking ~ 50 yr ago  Substance Use Topics  . Alcohol use: No    Alcohol/week: 2.0 standard drinks    Types: 2 Cans of beer per week    Comment: rare beer.  . Drug use: No     Allergies   Lisinopril and Xarelto  [rivaroxaban]   Review of Systems Review of Systems  Constitutional: Negative for chills, diaphoresis, fever  and unexpected weight change.  Respiratory: Positive for cough, shortness of breath and wheezing. Negative for chest tightness.   Cardiovascular: Positive for leg swelling. Negative for chest pain.       States leg swelling is baseline   Genitourinary: Negative for decreased urine volume, difficulty urinating, dysuria and frequency.  Musculoskeletal: Negative for arthralgias and myalgias.  Skin: Negative for rash and wound.  Allergic/Immunologic: Positive for immunocompromised state.  Neurological: Negative for dizziness and headaches.  Hematological: Bruises/bleeds easily.  Psychiatric/Behavioral: Negative for confusion.  All other systems reviewed and are negative.    Physical Exam Updated Vital Signs BP 116/83   Pulse 68   Temp 98.2 F (36.8 C) (Oral)   Resp (!) 23   SpO2 100%   Physical Exam  Constitutional: He is oriented to person, place, and time. He appears well-developed and well-nourished. No distress.  HENT:  Head: Normocephalic and atraumatic.  Cardiovascular: Normal rate, regular rhythm, normal heart sounds and intact distal pulses.  No murmur heard. Mild edema bilateral lower extremities   Pulmonary/Chest: Effort normal. No respiratory distress. He has decreased breath sounds in the right lower field and the left lower field. He has wheezes in the right upper field and the left upper field. He has rales. He exhibits no tenderness.  Abdominal: Soft. He exhibits no distension. There is no tenderness.  Neurological: He is alert and oriented to person, place, and time.  Skin: Skin is warm and dry. No rash noted. He is not diaphoretic.  Psychiatric: He has a normal mood and affect. His behavior is normal.  Nursing note and vitals reviewed.    ED Treatments / Results  Labs (all labs ordered are listed, but only abnormal results are displayed) Labs  Reviewed  BASIC METABOLIC PANEL - Abnormal; Notable for the following components:      Result Value   Glucose, Bld 136 (*)    Creatinine, Ser 1.77 (*)    GFR calc non Af Amer 34 (*)    GFR calc Af Amer 39 (*)    All other components within normal limits  CBC - Abnormal; Notable for the following components:   RBC 3.81 (*)    Hemoglobin 11.6 (*)    HCT 37.7 (*)    All other components within normal limits  URINALYSIS, ROUTINE W REFLEX MICROSCOPIC - Abnormal; Notable for the following components:   APPearance HAZY (*)    Hgb urine dipstick MODERATE (*)    Leukocytes, UA LARGE (*)    WBC, UA >50 (*)    Bacteria, UA FEW (*)    All other components within normal limits  TROPONIN I - Abnormal; Notable for the following components:   Troponin I 0.05 (*)    All other components within normal limits  BRAIN NATRIURETIC PEPTIDE - Abnormal; Notable for the following components:   B Natriuretic Peptide 823.0 (*)    All other components within normal limits  CBG MONITORING, ED - Abnormal; Notable for the following components:   Glucose-Capillary 126 (*)    All other components within normal limits  URINE CULTURE  TROPONIN I    EKG EKG Interpretation  Date/Time:  Monday August 28 2018 14:58:24 EDT Ventricular Rate:  70 PR Interval:    QRS Duration: 191 QT Interval:  513 QTC Calculation: 554 R Axis:   -78 Text Interpretation:  Accelerated junctional rhythm Left bundle branch block No significant change since Confirmed by Virgina Norfolk [phone]) on 08/28/2018 3:06:51 PM   Radiology Dg Chest  Port 1 View  Result Date: 08/28/2018 CLINICAL DATA:  Patient which generalized weakness. EXAM: PORTABLE CHEST 1 VIEW COMPARISON:  Chest radiograph 08/14/2018 FINDINGS: Single lead AICD device overlies the left hemithorax. Stable cardiomegaly. Bilateral interstitial pulmonary opacities, left-greater-than-right. No pleural effusion. Thoracic spine degenerative changes. IMPRESSION: Cardiomegaly with  mild interstitial edema. Electronically Signed   By: Annia Belt M.D.   On: 08/28/2018 15:03    Procedures Procedures (including critical care time)  Medications Ordered in ED Medications  furosemide (LASIX) injection 40 mg (has no administration in time range)     Initial Impression / Assessment and Plan / ED Course  I have reviewed the triage vital signs and the nursing notes.  Pertinent labs & imaging results that were available during my care of the patient were reviewed by me and considered in my medical decision making (see chart for details).  Clinical Course as of Aug 28 2118  Mon Aug 28, 2018  2049 84yo male presents with complaint of cough x 3 weeks, productive- white/clear sputum. Denies Cp, fevers, chills. On exam, mild lower extremity edema. Patient has a history of CHF, states he weighs himself daily and takes an extra fluid pill as needed, states his weight has not changed. Recently had cardiac cath with stent placed on 08/19/18. Also taking Cipro for UTI, delay in starting his abx, started 4 days ago and reports improvement in the color of his urine.  Had albuterol neb just PTA which he felt helped with his cough. Denies difficulty breathing. On exam, diminished in the bases, rales in bases, mild exp wheezes in upper fields.    [LM]  2051 Trop increased, increased previously as well, likely due to CKD. CBC without significant changes, BMP with Cr 1.77. BNP elevated at 832, increased from previous. UA with UTI although appears to be improving compared to previous. Discussed with Dr. Lockie Mola, ER attending, has seen the patient, recommends consult with cardiology for possible admission.   [LM]  2119 Case discussed with Cardiology who will come and see the patient.    [LM]    Clinical Course User Index [LM] Jeannie Fend, PA-C   Final Clinical Impressions(s) / ED Diagnoses   Final diagnoses:  Acute on chronic congestive heart failure, unspecified heart failure type  [facility])    ED Discharge Orders    None       Alden Hipp 08/28/18 2119    Virgina Norfolk, DO 08/28/18 2342

## 2018-08-28 NOTE — ED Triage Notes (Signed)
Pt reports generalized weakness since the discharged on 9/21 s/p cardiac stent placement.  Pt reports increased cough and sputum production for 2 weeks.  Denies pain, increased SOB or dyspnea.  Pt is Alert and Oriented x4 from home with wife.

## 2018-08-29 ENCOUNTER — Other Ambulatory Visit: Payer: Self-pay

## 2018-08-29 ENCOUNTER — Encounter (HOSPITAL_COMMUNITY): Payer: Self-pay

## 2018-08-29 DIAGNOSIS — I4891 Unspecified atrial fibrillation: Secondary | ICD-10-CM

## 2018-08-29 LAB — BASIC METABOLIC PANEL
ANION GAP: 8 (ref 5–15)
BUN: 25 mg/dL — ABNORMAL HIGH (ref 8–23)
CALCIUM: 8.4 mg/dL — AB (ref 8.9–10.3)
CO2: 24 mmol/L (ref 22–32)
CREATININE: 1.6 mg/dL — AB (ref 0.61–1.24)
Chloride: 107 mmol/L (ref 98–111)
GFR calc Af Amer: 45 mL/min — ABNORMAL LOW (ref >60)
GFR, EST NON AFRICAN AMERICAN: 38 mL/min — AB (ref >60)
GLUCOSE: 111 mg/dL — AB (ref 70–99)
Potassium: 3.9 mmol/L (ref 3.5–5.1)
Sodium: 139 mmol/L (ref 135–145)

## 2018-08-29 LAB — GLUCOSE, CAPILLARY
GLUCOSE-CAPILLARY: 114 mg/dL — AB (ref 70–99)
Glucose-Capillary: 77 mg/dL (ref 70–99)
Glucose-Capillary: 129 mg/dL — ABNORMAL HIGH (ref 70–99)
Glucose-Capillary: 146 mg/dL — ABNORMAL HIGH (ref 70–99)

## 2018-08-29 MED ORDER — GUAIFENESIN ER 600 MG PO TB12
600.0000 mg | ORAL_TABLET | Freq: Two times a day (BID) | ORAL | Status: DC | PRN
  Administered 2018-08-29 – 2018-08-30 (×3): 600 mg via ORAL
  Filled 2018-08-29 (×3): qty 1

## 2018-08-29 MED ORDER — DOCUSATE SODIUM 100 MG PO CAPS
100.0000 mg | ORAL_CAPSULE | Freq: Every day | ORAL | Status: DC
  Administered 2018-08-29 – 2018-08-31 (×3): 100 mg via ORAL
  Filled 2018-08-29 (×3): qty 1

## 2018-08-29 MED ORDER — CLOPIDOGREL BISULFATE 75 MG PO TABS
75.0000 mg | ORAL_TABLET | Freq: Every day | ORAL | Status: DC
  Administered 2018-08-29 – 2018-08-31 (×3): 75 mg via ORAL
  Filled 2018-08-29 (×3): qty 1

## 2018-08-29 MED ORDER — CLOPIDOGREL BISULFATE 75 MG PO TABS: 75.0000 mg | ORAL_TABLET | Freq: Every day | ORAL | Status: DC

## 2018-08-29 MED ORDER — APIXABAN 2.5 MG PO TABS
2.5000 mg | ORAL_TABLET | Freq: Two times a day (BID) | ORAL | Status: DC
  Administered 2018-08-29 – 2018-08-31 (×4): 2.5 mg via ORAL
  Filled 2018-08-29 (×4): qty 1

## 2018-08-29 NOTE — Plan of Care (Signed)

## 2018-08-29 NOTE — Progress Notes (Addendum)
Progress Note  Patient Name: Ryan Macdonald Date of Encounter: 08/29/2018  Primary Cardiologist: Dr. [name], MD   Subjective   Pt feeling great today. Biggest complaint seems to be an ongoing URI which has been lingering for the last 3 weeks   Inpatient Medications    Scheduled Meds: . allopurinol  200 mg Oral Daily  . amiodarone  200 mg Oral BID  . apixaban  2.5 mg Oral BID  . aspirin EC  81 mg Oral Daily  . carvedilol  6.25 mg Oral BID WC  . ciprofloxacin  500 mg Oral Q12H  . furosemide  40 mg Intravenous BID  . hydrALAZINE  10 mg Oral Q8H  . isosorbide dinitrate  20 mg Oral TID  . sodium chloride flush  3 mL Intravenous Q12H   Continuous Infusions: . sodium chloride     PRN Meds: sodium chloride, acetaminophen, HYDROcodone-acetaminophen, ondansetron (ZOFRAN) IV, sodium chloride flush   Vital Signs    Vitals:   08/29/18 0554 08/29/18 0601 08/29/18 0747 08/29/18 0845  BP:  105/71 (!) 91/50 113/70  Pulse:  69 71 76  Resp:   20   Temp:  98.8 F (37.1 C) 98 F (36.7 C)   TempSrc:  Oral Oral   SpO2:  95% 99%   Weight: 112.7 kg     Height:        Intake/Output Summary (Last 24 hours) at 08/29/2018 0956 Last data filed at 08/29/2018 0945 Gross per 24 hour  Intake 470 ml  Output 1300 ml  Net -830 ml   Filed Weights   08/28/18 2340 08/29/18 0554  Weight: 114.7 kg 112.7 kg    Physical Exam   General: Obese, NAD Skin: Warm, dry, intact  Head: Normocephalic, atraumatic, clear, moist mucus membranes. Neck: Negative for carotid bruits. No JVD Lungs:Clear to ausculation bilaterally. No wheezes, rales, or rhonchi. Breathing is unlabored. Cardiovascular: RRR with S1 S2. No murmurs, rubs, gallops, or LV heave appreciated. Abdomen: Soft, non-tender, non-distended with normoactive bowel sounds. No obvious abdominal masses. MSK: Strength and tone appear normal for age. 5/5 in all extremities Extremities: Mild 2+ edema, baseline. No clubbing or  cyanosis. DP/PT pulses 2+ bilaterally Neuro: Alert and oriented. No focal deficits. No facial asymmetry. MAE spontaneously. Psych: Responds to questions appropriately with normal affect.    Labs    Chemistry Recent Labs  Lab 08/28/18 1434 08/29/18 0358  NA 140 139  K 4.5 3.9  CL 105 107  CO2 23 24  GLUCOSE 136* 111*  BUN 23 25*  CREATININE 1.77* 1.60*  CALCIUM 8.9 8.4*  GFRNONAA 34* 38*  GFRAA 39* 45*  ANIONGAP 12 8     Hematology Recent Labs  Lab 08/28/18 1434  WBC 9.6  RBC 3.81*  HGB 11.6*  HCT 37.7*  MCV 99.0  MCH 30.4  MCHC 30.8  RDW 14.6  PLT 300    Cardiac Enzymes Recent Labs  Lab 08/28/18 1434 08/28/18 2004  TROPONINI 0.05* 0.04*   No results for input(s): TROPIPOC in the last 168 hours.   BNP Recent Labs  Lab 08/28/18 1443  BNP 823.0*    DDimer No results for input(s): DDIMER in the last 168 hours.   Radiology    Dg Chest Port 1 View  Result Date: 08/28/2018 CLINICAL DATA:  Patient which generalized weakness. EXAM: PORTABLE CHEST 1 VIEW COMPARISON:  Chest radiograph 08/14/2018 FINDINGS: Single lead AICD device overlies the left hemithorax. Stable cardiomegaly. Bilateral interstitial pulmonary opacities, left-greater-than-right. No  pleural effusion. Thoracic spine degenerative changes. IMPRESSION: Cardiomegaly with mild interstitial edema. Electronically Signed   By: Annia Belt M.D.   On: 08/28/2018 15:03   Telemetry    08/29/18 V paced  - Personally Reviewed  ECG    No new tracing as of 08/29/18 - Personally Reviewed  Cardiac Studies   Cardiac catheterization 08/18/18:  Mid LM to Dist LM lesion is 40% stenosed. Cross sectional area 6.7 cm2.  Ost Cx lesion is 50% stenosed. Cross sectional area 5.5 cm2.  Mid LAD lesion is 50% stenosed.  LV end diastolic pressure is normal. LVEDP 12 mm Hg.  There is no aortic valve stenosis.  Prox LAD lesion is 75% stenosed. Severely calcified. Cross sectional area 3.2 and heavily  calcified.  A drug-eluting stent was successfully placed using a STENT SYNERGY DES 3X12, postdilated to 3.3 mm.  Post intervention, there is a 0% residual stenosis.     Recommend to resume Apixaban, at currently prescribed dose and frequency, on 08/19/18.  Recommend concurrent antiplatelet therapy of Aspirin 81mg  daily for 1 month and Clopidogrel 75mg  daily for 6 months.    Initially given Brilinta for quick onset but he had some nausea and vomiting post cath.  IV tirofiban given.  Will give clopidogrel 300 mg x1 followed by 75 mg daily.   Hydration due to baseline renal insufficiency.    Echocardiogram 04/30/18: Study Conclusions  - Left ventricle: The cavity size was moderately dilated. Wall   thickness was normal. Systolic function was severely reduced. The   estimated ejection fraction was in the range of 20% to 25%.   Diffuse hypokinesis. Features are consistent with a pseudonormal   left ventricular filling pattern, with concomitant abnormal   relaxation and increased filling pressure (grade 2 diastolic   dysfunction). Doppler parameters are consistent with high   ventricular filling pressure. - Aortic valve: There was trivial regurgitation. - Mitral valve: There was mild regurgitation. Valve area by   pressure half-time: 1.09 cm^2. - Left atrium: The atrium was mildly dilated. - Right ventricle: Pacer wire or catheter noted in right ventricle.   Systolic function was reduced.  Patient Profile     84 y.o. male with history of non-ischemic cardiomyopathy (LVEF 20%) status-post Biotronik single-chamber ICD (with floating atrial electrodes on Linox Smart DX ventricular lead), recent PCI to paroxysmal atrial fibrillation on apixiban, type 2 diabetes mellitus, hypertension, hyperlipidemia, who presented to [facility] on 08/28/18 with acute on chronic systolic heart failure. Followed at [facility]   Assessment & Plan    1. Acute on chronic systolic heart failure s/p single chamber ICD  secondary to ischemic cardiomyopathy with EF 20-25%: -Ischemic cardiomyopathy with LVEF 20-25% on 04/30/18 -BNP elevated at 823  -Continue IV Lasix 40mg  BID -Trop mildly elevated but flat at 0.05>0.04 with no ACS symptoms  -Weight, 248lb today>>252lb yesterday   -I&O, net negative sicne admission  -Continue carvedilol, isosorbide dinitrate/hydralazine -Not a candidate for ACE/ARB secondary to known allergy  2. Atrial fibrillation, paroxysmal : -HR currently controlled, V paced  -Continue Eliquis, amiodarone 200mg  BID  -CHA2DS2VASc =5  3. DM2: -Stable, HbA1c, 6.0 on 08/14/18 -SSI for glucose control while inpatient status -Glipizide on hold   4. UTI/URI: -Continue Cipro -Consider adding antitussive   5. Acute on chronic kidney injury: -Creatinine, 1.60 today, down from 1.77 yesterday  -Baseline appears to be in the 1.4-1.6 range -Monitor response with daily BMET and avioid nephrotoxic medications   6. Recent PCI/DES to pLAD: -On 08/18/18 PCI to  pLAD with recommendations for DAPT with ASA and Plavix  -Denies chest pain or other ACS symptoms   Signed, Georgie Chard NP-C [facility] Pager: [phone] 08/29/2018, 9:56 AM     For questions or updates, please contact   Please consult [email or web address] for contact info under Cardiology/STEMI. -------------------------------------------------------------------------------------   History and all data above reviewed.  Patient examined.  I agree with the findings as above.  Ryan Macdonald is doing well, coughing but feels better than admission.  Constitutional: No acute distress Eyes: pupils equally round and reactive to light,  ENMT: moist mucous membranes Cardiovascular: regular rhythm, normal rate, no significant  murmurs. S1 and S2 normal.  Respiratory: decreased in the lung bases bilaterally GI : normal bowel sounds, soft and nontender. No distention.   MSK: extremities warm, well perfused. 1+ pitting edema  bilaterally.  NEURO: grossly nonfocal exam, moves all extremities. PSYCH: alert and oriented x 3, normal mood and affect.   All available labs, radiology testing, previous records reviewed. Agree with documented assessment and plan of my colleague as stated above with the following additions or changes:  Active Problems:   Atrial fibrillation (HCC)   Diabetes mellitus with complication (HCC)   Acute on chronic systolic (congestive) heart failure (HCC)   URI (upper respiratory infection)   UTI (urinary tract infection)   Plan: We will continue IV diuresis with Lasix.  We will also continue ciprofloxacin for treatment of UTI/URI.  Patient is coughing and requests antitussive and expectorant, we are happy to provide this as well.  Remainder per APP note above.  Parke Poisson, MD [facility] 6:19 PM  08/29/2018

## 2018-08-30 LAB — URINE CULTURE: Culture: NO GROWTH

## 2018-08-30 LAB — GLUCOSE, CAPILLARY
GLUCOSE-CAPILLARY: 108 mg/dL — AB (ref 70–99)
GLUCOSE-CAPILLARY: 126 mg/dL — AB (ref 70–99)
Glucose-Capillary: 108 mg/dL — ABNORMAL HIGH (ref 70–99)
Glucose-Capillary: 111 mg/dL — ABNORMAL HIGH (ref 70–99)

## 2018-08-30 LAB — BASIC METABOLIC PANEL
Anion gap: 6 (ref 5–15)
BUN: 27 mg/dL — AB (ref 8–23)
CALCIUM: 8.4 mg/dL — AB (ref 8.9–10.3)
CHLORIDE: 104 mmol/L (ref 98–111)
CO2: 27 mmol/L (ref 22–32)
CREATININE: 1.77 mg/dL — AB (ref 0.61–1.24)
GFR calc Af Amer: 39 mL/min — ABNORMAL LOW (ref >60)
GFR calc non Af Amer: 34 mL/min — ABNORMAL LOW (ref >60)
Glucose, Bld: 108 mg/dL — ABNORMAL HIGH (ref 70–99)
Potassium: 4 mmol/L (ref 3.5–5.1)
Sodium: 137 mmol/L (ref 135–145)

## 2018-08-30 MED ORDER — TORSEMIDE 20 MG PO TABS
10.0000 mg | ORAL_TABLET | Freq: Once | ORAL | Status: AC
  Administered 2018-08-30: 10 mg via ORAL
  Filled 2018-08-30: qty 1

## 2018-08-30 MED ORDER — TORSEMIDE 20 MG PO TABS
20.0000 mg | ORAL_TABLET | Freq: Every day | ORAL | Status: DC
  Administered 2018-08-31: 20 mg via ORAL
  Filled 2018-08-30: qty 1

## 2018-08-30 NOTE — Progress Notes (Addendum)
Progress Note  Patient Name: Ryan Macdonald Date of Encounter: 08/30/2018  Primary Cardiologist: Dr. [name], MD   Subjective   Pt sleeping this morning. RN reports that cough has improved overnight with no additional complaints.   Inpatient Medications    Scheduled Meds: . allopurinol  200 mg Oral Daily  . amiodarone  200 mg Oral BID  . apixaban  2.5 mg Oral BID  . aspirin EC  81 mg Oral Daily  . carvedilol  6.25 mg Oral BID WC  . ciprofloxacin  500 mg Oral Q12H  . clopidogrel  75 mg Oral Q breakfast  . docusate sodium  100 mg Oral Daily  . furosemide  40 mg Intravenous BID  . hydrALAZINE  10 mg Oral Q8H  . isosorbide dinitrate  20 mg Oral TID  . sodium chloride flush  3 mL Intravenous Q12H   Continuous Infusions: . sodium chloride     PRN Meds: sodium chloride, acetaminophen, guaiFENesin, HYDROcodone-acetaminophen, ondansetron (ZOFRAN) IV, sodium chloride flush   Vital Signs    Vitals:   08/30/18 0046 08/30/18 0422 08/30/18 0543 08/30/18 0808  BP:  126/70 122/84   Pulse: 70   68  Resp: 18 18 19  (!) 22  Temp: 98 F (36.7 C) 98.8 F (37.1 C)  97.8 F (36.6 C)  TempSrc: Oral Oral  Oral  SpO2: 98% 100%  97%  Weight:  112.3 kg    Height:        Intake/Output Summary (Last 24 hours) at 08/30/2018 1108 Last data filed at 08/30/2018 1000 Gross per 24 hour  Intake 360 ml  Output 1425 ml  Net -1065 ml   Filed Weights   08/28/18 2340 08/29/18 0554 08/30/18 0422  Weight: 114.7 kg 112.7 kg 112.3 kg    Physical Exam   General: Obese, NAD Skin: Warm, dry, intact  Head: Normocephalic, atraumatic, clear, moist mucus membranes. Neck: Negative for carotid bruits. No JVD Lungs:Clear to ausculation bilaterally. No wheezes, rales, or rhonchi. Breathing is unlabored. Cardiovascular: RRR with S1 S2. No murmurs, rubs, gallops, or LV heave appreciated. Abdomen: Soft, non-tender, non-distended with normoactive bowel sounds. No obvious abdominal masses. MSK:  Strength and tone appear normal for age. 5/5 in all extremities Extremities: 2+ BLE edema. No clubbing or cyanosis. DP/PT pulses 2+ bilaterally Neuro: Alert and oriented. No focal deficits. No facial asymmetry. MAE spontaneously. Psych: Responds to questions appropriately with normal affect.    Labs    Chemistry Recent Labs  Lab 08/28/18 1434 08/29/18 0358 08/30/18 0410  NA 140 139 137  K 4.5 3.9 4.0  CL 105 107 104  CO2 23 24 27   GLUCOSE 136* 111* 108*  BUN 23 25* 27*  CREATININE 1.77* 1.60* 1.77*  CALCIUM 8.9 8.4* 8.4*  GFRNONAA 34* 38* 34*  GFRAA 39* 45* 39*  ANIONGAP 12 8 6      Hematology Recent Labs  Lab 08/28/18 1434  WBC 9.6  RBC 3.81*  HGB 11.6*  HCT 37.7*  MCV 99.0  MCH 30.4  MCHC 30.8  RDW 14.6  PLT 300    Cardiac Enzymes Recent Labs  Lab 08/28/18 1434 08/28/18 2004  TROPONINI 0.05* 0.04*   No results for input(s): TROPIPOC in the last 168 hours.   BNP Recent Labs  Lab 08/28/18 1443  BNP 823.0*     DDimer No results for input(s): DDIMER in the last 168 hours.   Radiology    Dg Chest Port 1 View  Result Date: 08/28/2018 CLINICAL  DATA:  Patient which generalized weakness. EXAM: PORTABLE CHEST 1 VIEW COMPARISON:  Chest radiograph 08/14/2018 FINDINGS: Single lead AICD device overlies the left hemithorax. Stable cardiomegaly. Bilateral interstitial pulmonary opacities, left-greater-than-right. No pleural effusion. Thoracic spine degenerative changes. IMPRESSION: Cardiomegaly with mild interstitial edema. Electronically Signed   By: Annia Belt M.D.   On: 08/28/2018 15:03    Telemetry    08/30/18 V paced - Personally Reviewed  ECG    No new tracing as of 08/30/18- Personally Reviewed  Cardiac Studies   Cardiac catheterization 08/18/18:  Mid LM to Dist LM lesion is 40% stenosed. Cross sectional area 6.7 cm2.  Ost Cx lesion is 50% stenosed. Cross sectional area 5.5 cm2.  Mid LAD lesion is 50% stenosed.  LV end diastolic pressure is  normal. LVEDP 12 mm Hg.  There is no aortic valve stenosis.  Prox LAD lesion is 75% stenosed. Severely calcified. Cross sectional area 3.2 and heavily calcified.  A drug-eluting stent was successfully placed using a STENT SYNERGY DES 3X12, postdilated to 3.3 mm.  Post intervention, there is a 0% residual stenosis.    Recommend to resume Apixaban, at currently prescribed dose and frequency, on 08/19/18. Recommend concurrent antiplatelet therapy of Aspirin 81mg  daily for 1 monthand Clopidogrel 75mg  daily for 6 months.  Initially given Brilinta for quick onset but he had some nausea and vomiting post cath. IV tirofiban given. Will give clopidogrel 300 mg x1 followed by 75 mg daily.   Hydration due to baseline renal insufficiency.    Echocardiogram 04/30/18: Study Conclusions  - Left ventricle: The cavity size was moderately dilated. Wall thickness was normal. Systolic function was severely reduced. The estimated ejection fraction was in the range of 20% to 25%. Diffuse hypokinesis. Features are consistent with a pseudonormal left ventricular filling pattern, with concomitant abnormal relaxation and increased filling pressure (grade 2 diastolic dysfunction). Doppler parameters are consistent with high ventricular filling pressure. - Aortic valve: There was trivial regurgitation. - Mitral valve: There was mild regurgitation. Valve area by pressure half-time: 1.09 cm^2. - Left atrium: The atrium was mildly dilated. - Right ventricle: Pacer wire or catheter noted in right ventricle. Systolic function was reduced.  Patient Profile     84 y.o. male with history ofnon-ischemic cardiomyopathy (LVEF 20%) status-post Biotronik single-chamber ICD (with floating atrial electrodes onLinox Smart DXventricular lead), recent PCI to paroxysmal atrial fibrillation on apixiban, type 2 diabetes mellitus, hypertension, hyperlipidemia, who presented to [facility] on 08/28/18  with acute on chronic systolic heart failure. Followed at [facility]   Assessment & Plan    1. Acute on chronic systolic heart failure s/p single chamber ICD secondary to ischemic cardiomyopathy with EF 20-25%: -Ischemic cardiomyopathy with LVEF 20-25% on 04/30/18 -BNP elevated at 823  -Transition Lasix to 40mg  PO twice daily? >>Pt was on home Torsemide 20mg  daily with PRN instructions for acute weight gain -Trop mildly elevated but flat at 0.05>0.04 with no ACS symptoms  -Weight, 247lb today>>252lb yesterday   -I&O, net negative 1.8L since admission  -Continue carvedilol, isosorbide dinitrate/hydralazine -Not a candidate for ACE/ARB secondary to known allergy  2. Atrial fibrillation, paroxysmal : -HR currently controlled, V paced  -Continue Eliquis, amiodarone 200mg  BID  -CHA2DS2VASc =5  3. DM2: -Stable, HbA1c, 6.0 on 08/14/18 -SSI for glucose control while inpatient status -Glipizide on hold   4. UTI/URI: -Continue Cipro -Consider adding antitussive   5. Acute on chronic kidney injury: -Creatinine, 1.77 today, up from 1.60 yesterday  -Baseline appears to be in the 1.4-1.6 range -  Monitor response with daily BMET and avioid nephrotoxic medications   6. Recent PCI/DES to pLAD: -On 08/18/18 PCI to pLAD with recommendations for DAPT with ASA and Plavix  -Denies chest pain or other ACS symptoms    Signed, Georgie Chard NP-C [facility] Pager: [phone] 08/30/2018, 11:08 AM     For questions or updates, please contact   Please consult [email or web address] for contact info under Cardiology/STEMI.  --------------------------------------------------------------------------------------   History and all data above reviewed.  Patient examined.  I agree with the findings as above.  Ryan Macdonald is doing well today, cough is improved, and feels that he is ready to go home.  Nursing has expressed a concern about his strength, requiring further assessment while in hospital with  physical therapy.  Constitutional: No acute distress Eyes: pupils equally round and reactive to light,  ENMT: moist mucous membranes Cardiovascular: regular rhythm, normal rate, no significant  murmurs. S1 and S2 normal.  Respiratory: decreased in the lung bases bilaterally GI : normal bowel sounds, soft and nontender. No distention.   MSK: extremities warm, well perfused. 1+ pitting edema bilaterally.  NEURO: grossly nonfocal exam, moves all extremities. PSYCH: alert and oriented x 3, normal mood and affect.  All available labs, radiology testing, previous records reviewed. Agree with documented assessment and plan of my colleague as stated above with the following additions or changes:  Active Problems:   Atrial fibrillation (HCC)   ICD (implantable cardioverter-defibrillator) in place   Diabetes mellitus with complication (HCC)   Acute on chronic systolic (congestive) heart failure (HCC)   URI (upper respiratory infection)   UTI (urinary tract infection)    Plan: The bulk of our conversation today involved medication reconciliation and clarification of which medicines he should be taking after this hospital dismissal.  First and foremost his last dose of ciprofloxacin will be today for treatment of UTI/URI.  We will continue the remainder of his home medications, and will continue isosorbide dinitrate as well as hydralazine.  He was instructed not to take these medications at the end of his last hospital stay verbally, however in his dismissal instructions that are documented in the medical record, these medications are to be continued.  I do not see a contraindication at this time to continuing these heart failure/antihypertensive medications.  This evening we will transition from intravenous Lasix to his home torsemide.  I will give half dose of his home torsemide this evening and resume full home-going dose tomorrow.  We discussed that he is welcome to take an expectorant and  antitussives so long as it does not include a stimulant decongestant.  He demonstrated understanding.  His wife was present for the entirety of our discussion.  Pending physical therapy evaluation, we can likely dismiss home tomorrow.  Parke Poisson, MD [facility] 6:31 PM  08/30/2018

## 2018-08-30 NOTE — Evaluation (Signed)
Physical Therapy Evaluation Patient Details Name: Ryan Macdonald MRN: [id number] DOB: July 17, 1934 Today's Date: 08/30/2018   History of Present Illness  Pt is an 84 y/o male admitted secondary to progressive weakness and cough, likely secondary to CHF exacerbation. PMH includes a fib, L THA, DM, CHF, nonischemic cardiomyopathy s/p ICD.   Clinical Impression  Pt admitted secondary to problem above with deficits below. Mild unsteadiness noted, and "waddle" type gait secondary to pain. Min guard for mobility without use of AD. Pt reports wife will be able to assist at d/c and has all necessary equipment at home. Will continue to follow acutely to maximize functional mobility independence and safety.     Follow Up Recommendations Home health PT    Equipment Recommendations  None recommended by PT    Recommendations for Other Services       Precautions / Restrictions Precautions Precautions: None Restrictions Weight Bearing Restrictions: No      Mobility  Bed Mobility               General bed mobility comments: Sitting EOB upon entry.   Transfers Overall transfer level: Needs assistance Equipment used: None Transfers: Sit to/from Stand Sit to Stand: Min guard         General transfer comment: Min guard for safety. Increased time required to perform.   Ambulation/Gait Ambulation/Gait assistance: Supervision;Min guard Gait Distance (Feet): 100 Feet Assistive device: None Gait Pattern/deviations: Step-through pattern;Decreased stride length;Antalgic;Trunk flexed Gait velocity: decreased    General Gait Details: Slow, antalgic gait, however, pt reports this is baseline. Mild unsteadiness, however, no overt LOB noted. Pt reaching for rail in hall towards end of gait for support.   Stairs Stairs: Yes       General stair comments: Verbally reviewed safe stair navigation and sequencing with pt.   Wheelchair Mobility    Modified Rankin (Stroke Patients Only)        Balance Overall balance assessment: Needs assistance Sitting-balance support: Feet supported;No upper extremity supported Sitting balance-Leahy Scale: Good     Standing balance support: No upper extremity supported;During functional activity;Single extremity supported Standing balance-Leahy Scale: Fair                               Pertinent Vitals/Pain Pain Assessment: Faces Faces Pain Scale: Hurts even more Pain Location: LLE  Pain Descriptors / Indicators: Constant;Discomfort;Guarding;Grimacing Pain Intervention(s): Limited activity within patient's tolerance;Monitored during session;Repositioned    Home Living Family/patient expects to be discharged to:: Private residence Living Arrangements: Spouse/significant other Available Help at Discharge: Family;Available 24 hours/day Type of Home: Apartment Home Access: Stairs to enter Entrance Stairs-Rails: Can reach both Entrance Stairs-Number of Steps: 4 Home Layout: One level Home Equipment: Bedside commode;Shower seat;Cane - single point;Grab bars - toilet;Walker - 2 wheels;Wheelchair - manual      Prior Function Level of Independence: Independent with assistive device(s)         Comments: Uses SPC and RW as needed; Pt drives, cooks, Best boy        Extremity/Trunk Assessment   Upper Extremity Assessment Upper Extremity Assessment: Overall WFL for tasks assessed    Lower Extremity Assessment Lower Extremity Assessment: LLE deficits/detail LLE Deficits / Details: LLE pain at baseline     Cervical / Trunk Assessment Cervical / Trunk Assessment: Kyphotic  Communication   Communication: No difficulties  Cognition Arousal/Alertness: Awake/alert Behavior During Therapy: WFL for tasks assessed/performed Overall Cognitive  Status: Within Functional Limits for tasks assessed                                        General Comments General comments (skin  integrity, edema, etc.): Pt's wife present throughout session     Exercises     Assessment/Plan    PT Assessment Patient needs continued PT services  PT Problem List Decreased strength;Decreased mobility;Decreased knowledge of use of DME;Decreased knowledge of precautions;Pain       PT Treatment Interventions DME instruction;Gait training;Functional mobility training;Stair training;Therapeutic activities;Therapeutic exercise;Balance training;Patient/family education    PT Goals (Current goals can be found in the Care Plan section)  Acute Rehab PT Goals Patient Stated Goal: to go home  PT Goal Formulation: With patient Time For Goal Achievement: 09/13/18 Potential to Achieve Goals: Good    Frequency Min 3X/week   Barriers to discharge        Co-evaluation               AM-PAC PT "6 Clicks" Daily Activity  Outcome Measure Difficulty turning over in bed (including adjusting bedclothes, sheets and blankets)?: A Little Difficulty moving from lying on back to sitting on the side of the bed? : A Little Difficulty sitting down on and standing up from a chair with arms (e.g., wheelchair, bedside commode, etc,.)?: Unable Help needed moving to and from a bed to chair (including a wheelchair)?: A Little Help needed walking in hospital room?: A Little Help needed climbing 3-5 steps with a railing? : A Lot 6 Click Score: 15    End of Session Equipment Utilized During Treatment: Gait belt Activity Tolerance: Patient limited by pain Patient left: in bed;with call bell/phone within reach;with family/visitor present(sitting EOB ) Nurse Communication: Mobility status PT Visit Diagnosis: Muscle weakness (generalized) (M62.81);Other abnormalities of gait and mobility (R26.89)    Time: 9494-4739 PT Time Calculation (min) (ACUTE ONLY): 16 min   Charges:   PT Evaluation $PT Eval Low Complexity: 1 Low          Gladys Damme, PT, DPT  Acute Rehabilitation Services   Pager: [phone] Office: [phone]   Lehman Prom 08/30/2018, 5:50 PM

## 2018-08-30 NOTE — Plan of Care (Signed)
  Problem: Education: Goal: Knowledge of General Education information will improve Description Including pain rating scale, medication(s)/side effects and non-pharmacologic comfort measures Outcome: Progressing Note:  POC reviewed with pt.   

## 2018-08-30 NOTE — Discharge Instructions (Addendum)
Heart Failure °Heart failure means your heart has trouble pumping blood. This makes it hard for your body to work well. Heart failure is usually a long-term (chronic) condition. You must take good care of yourself and follow your doctor's treatment plan. °Follow these instructions at home: °· Take your heart medicine as told by your doctor. °? Do not stop taking medicine unless your doctor tells you to. °? Do not skip any dose of medicine. °? Refill your medicines before they run out. °? Take other medicines only as told by your doctor or pharmacist. °· Stay active if told by your doctor. The elderly and people with severe heart failure should talk with a doctor about physical activity. °· Eat heart-healthy foods. Choose foods that are without trans fat and are low in saturated fat, cholesterol, and salt (sodium). This includes fresh or frozen fruits and vegetables, fish, lean meats, fat-free or low-fat dairy foods, whole grains, and high-fiber foods. Lentils and dried peas and beans (legumes) are also good choices. °· Limit salt if told by your doctor. °· Cook in a healthy way. Roast, grill, broil, bake, poach, steam, or stir-fry foods. °· Limit fluids as told by your doctor. °· Weigh yourself every morning. Do this after you pee (urinate) and before you eat breakfast. Write down your weight to give to your doctor. °· Take your blood pressure and write it down if your doctor tells you to. °· Ask your doctor how to check your pulse. Check your pulse as told. °· Lose weight if told by your doctor. °· Stop smoking or chewing tobacco. Do not use gum or patches that help you quit without your doctor's approval. °· Schedule and go to doctor visits as told. °· Nonpregnant women should have no more than 1 drink a day. Men should have no more than 2 drinks a day. Talk to your doctor about drinking alcohol. °· Stop illegal drug use. °· Stay current with shots (immunizations). °· Manage your health conditions as told by your  doctor. °· Learn to manage your stress. °· Rest when you are tired. °· If it is really hot outside: °? Avoid intense activities. °? Use air conditioning or fans, or get in a cooler place. °? Avoid caffeine and alcohol. °? Wear loose-fitting, lightweight, and light-colored clothing. °· If it is really cold outside: °? Avoid intense activities. °? Layer your clothing. °? Wear mittens or gloves, a hat, and a scarf when going outside. °? Avoid alcohol. °· Learn about heart failure and get support as needed. °· Get help to maintain or improve your quality of life and your ability to care for yourself as needed. °Contact a doctor if: °· You gain weight quickly. °· You are more short of breath than usual. °· You cannot do your normal activities. °· You tire easily. °· You cough more than normal, especially with activity. °· You have any or more puffiness (swelling) in areas such as your hands, feet, ankles, or belly (abdomen). °· You cannot sleep because it is hard to breathe. °· You feel like your heart is beating fast (palpitations). °· You get dizzy or light-headed when you stand up. °Get help right away if: °· You have trouble breathing. °· There is a change in mental status, such as becoming less alert or not being able to focus. °· You have chest pain or discomfort. °· You faint. °This information is not intended to replace advice given to you by your health care provider. Make sure you   discuss any questions you have with your health care provider. °Document Released: 08/24/2008 Document Revised: 04/22/2016 Document Reviewed: 01/01/2013 °Elsevier Interactive Patient Education © 2017 [employer] ° ° ° °Cooking With Less Salt °Cooking with less salt is one way to reduce the amount of sodium you get from food. Depending on your condition and overall health, your health care provider or diet and nutrition specialist (dietitian) may recommend that you reduce your sodium intake. Most people should have less than 2,300  milligrams (mg) of sodium each day. If you have high blood pressure (hypertension), you may need to limit your sodium to 1,500 mg each day. Follow the tips below to help reduce your sodium intake. °What do I need to know about cooking with less salt? °Shopping °· Buy sodium-free or low-sodium products. Look for the following words on food labels: °? Low-sodium. °? Sodium-free. °? Reduced-sodium. °? No salt added. °? Unsalted. °· Buy fresh or frozen vegetables. Avoid canned vegetables. °· Avoid buying meats or protein foods that have been injected with broth or saline solution. °· Avoid cured or smoked meats, such as hot dogs, bacon, salami, ham, and bologna. °Reading food labels °· Check the food label before buying or using packaged ingredients. °· Look for products with no more than 140 mg of sodium in one serving. °· Do not choose foods with salt as one of the first three ingredients on the ingredients list. If salt is one of the first three ingredients, it usually means the item is high in sodium, because ingredients are listed in order of amount in the food item. °Cooking °· Use herbs, seasonings without salt, and spices as substitutes for salt in foods. °· Use sodium-free baking soda when baking. °· Grill, braise, or roast foods to add flavor with less salt. °· Avoid adding salt to pasta, rice, or hot cereals while cooking. °· Drain and rinse canned vegetables before use. °· Avoid adding salt when cooking sweets and desserts. °· Cook with low-sodium ingredients. °What are some salt alternatives? °The following are herbs, seasonings, and spices that can be used instead of salt to give taste to your food. Herbs should be fresh or dried. Do not choose packaged mixes. Next to the name of the herb, spice, or seasoning are some examples of foods you can pair it with. °Herbs °· Bay leaves - Soups, meat and vegetable dishes, and spaghetti sauce. °· Basil - Italian dishes, soups, pasta, and fish dishes. °· Cilantro -  Meat, poultry, and vegetable dishes. °· Chili powder - Marinades and Mexican dishes. °· Chives - Salad dressings and potato dishes. °· Cumin - Mexican dishes, couscous, and meat dishes. °· Dill - Fish dishes, sauces, and salads. °· Fennel - Meat and vegetable dishes, breads, and cookies. °· Garlic (do not use garlic salt) - Italian dishes, meat dishes, salad dressings, and sauces. °· Marjoram - Soups, potato dishes, and meat dishes. °· Oregano - Pizza and spaghetti sauce. °· Parsley - Salads, soups, pasta, and meat dishes. °· Rosemary - Italian dishes, salad dressings, soups, and red meats. °· Saffron - Fish dishes, pasta, and some poultry dishes. °· Sage - Stuffings and sauces. °· Tarragon - Fish and poultry dishes. °· Thyme - Stuffing, meat, and fish dishes. °Seasonings °· Lemon juice - Fish dishes, poultry dishes, vegetables, and salads. °· Vinegar - Salad dressings, vegetables, and fish dishes. °Spices °· Cinnamon - Sweet dishes, such as cakes, cookies, and puddings. °· Cloves - Gingerbread, puddings, and marinades for meats. °· Curry -   Vegetable dishes, fish and poultry dishes, and stir-fry dishes.  Ginger - Vegetables dishes, fish dishes, and stir-fry dishes.  Nutmeg - Pasta, vegetables, poultry, fish dishes, and custard. What are some low-sodium ingredients and foods?  Fresh or frozen fruits and vegetables with no sauce added.  Fresh or frozen whole meats, poultry, and fish with no sauce added.  Eggs.  Noodles, pasta, quinoa, rice.  Shredded or puffed wheat or puffed rice.  Regular or quick oats.  Milk, yogurt, hard cheeses, and low-sodium cheeses. Good cheese choices include Swiss, [employer], and [address]. Always check the label for the serving size and sodium content.  Unsalted butter or margarine.  Unsalted nuts.  Sherbet or ice cream (keep to  cup per serving).  Homemade pudding.  Sodium-free baking soda and baking powder. This is not a complete list of low-sodium  ingredients and foods. Contact your dietitian for more options. Summary  Cooking with less salt is one way to reduce the amount of sodium that you get from food.  Buy sodium-free or low-sodium products.  Check the food label before using or buying packaged ingredients.  Use herbs, seasonings without salt, and spices as substitutes for salt in foods. This information is not intended to replace advice given to you by your health care provider. Make sure you discuss any questions you have with your health care provider. Document Released: 11/15/2005 Document Revised: 11/23/2016 Document Reviewed: 11/23/2016 Elsevier Interactive Patient Education  2017 [employer].    Information on my medicine - ELIQUIS (apixaban)  This medication education was reviewed with me or my healthcare representative as part of my discharge preparation.    Why was Eliquis prescribed for you? Eliquis was prescribed for you to reduce the risk of a blood clot forming that can cause a stroke if you have a medical condition called atrial fibrillation (a type of irregular heartbeat).  What do You need to know about Eliquis ? Take your Eliquis TWICE DAILY - one tablet in the morning and one tablet in the evening with or without food. If you have difficulty swallowing the tablet whole please discuss with your pharmacist how to take the medication safely.  Take Eliquis exactly as prescribed by your doctor and DO NOT stop taking Eliquis without talking to the doctor who prescribed the medication.  Stopping may increase your risk of developing a stroke.  Refill your prescription before you run out.  After discharge, you should have regular check-up appointments with your healthcare provider that is prescribing your Eliquis.  In the future your dose may need to be changed if your kidney function or weight changes by a significant amount or as you get older.  What do you do if you miss a dose? If you miss a dose,  take it as soon as you remember on the same day and resume taking twice daily.  Do not take more than one dose of ELIQUIS at the same time to make up a missed dose.  Important Safety Information A possible side effect of Eliquis is bleeding. You should call your healthcare provider right away if you experience any of the following: ? Bleeding from an injury or your nose that does not stop. ? Unusual colored urine (red or dark brown) or unusual colored stools (red or black). ? Unusual bruising for unknown reasons. ? A serious fall or if you hit your head (even if there is no bleeding).  Some medicines may interact with Eliquis and might increase your risk of  bleeding or clotting while on Eliquis. To help avoid this, consult your healthcare provider or pharmacist prior to using any new prescription or non-prescription medications, including herbals, vitamins, non-steroidal anti-inflammatory drugs (NSAIDs) and supplements.  This website has more information on Eliquis (apixaban): [email or web address].[email or web address]

## 2018-08-31 ENCOUNTER — Telehealth: Payer: Self-pay | Admitting: Adult Health

## 2018-08-31 DIAGNOSIS — J069 Acute upper respiratory infection, unspecified: Secondary | ICD-10-CM | POA: Diagnosis present

## 2018-08-31 DIAGNOSIS — N390 Urinary tract infection, site not specified: Secondary | ICD-10-CM | POA: Diagnosis present

## 2018-08-31 LAB — BASIC METABOLIC PANEL
Anion gap: 6 (ref 5–15)
BUN: 29 mg/dL — ABNORMAL HIGH (ref 8–23)
CALCIUM: 8.5 mg/dL — AB (ref 8.9–10.3)
CHLORIDE: 103 mmol/L (ref 98–111)
CO2: 27 mmol/L (ref 22–32)
Creatinine, Ser: 1.79 mg/dL — ABNORMAL HIGH (ref 0.61–1.24)
GFR, EST AFRICAN AMERICAN: 39 mL/min — AB (ref >60)
GFR, EST NON AFRICAN AMERICAN: 34 mL/min — AB (ref >60)
GLUCOSE: 93 mg/dL (ref 70–99)
POTASSIUM: 3.6 mmol/L (ref 3.5–5.1)
SODIUM: 136 mmol/L (ref 135–145)

## 2018-08-31 LAB — GLUCOSE, CAPILLARY
GLUCOSE-CAPILLARY: 103 mg/dL — AB (ref 70–99)
GLUCOSE-CAPILLARY: 146 mg/dL — AB (ref 70–99)

## 2018-08-31 MED ORDER — SERTRALINE HCL 50 MG PO TABS
25.0000 mg | ORAL_TABLET | Freq: Every day | ORAL | Status: DC
  Administered 2018-08-31: 25 mg via ORAL
  Filled 2018-08-31 (×2): qty 1

## 2018-08-31 NOTE — Discharge Summary (Addendum)
Discharge Summary    Patient ID: Ryan Macdonald MRN: [id number]; DOB: [birth year]  Admit date: 08/28/2018 Discharge date: 08/31/2018  Primary Care Provider: [facility]  Primary Cardiologist: [name], MD  Primary Electrophysiologist:  None   Discharge Diagnoses    Active Problems:   Atrial fibrillation [facility])   ICD (implantable cardioverter-defibrillator) in place   Diabetes mellitus with complication (HCC)   Acute on chronic systolic (congestive) heart failure (HCC)   URI (upper respiratory infection)   UTI (urinary tract infection)   Allergies Allergies  Allergen Reactions  . Lisinopril Swelling    Facial swelling  . Xarelto [Rivaroxaban] Other (See Comments)    bleeding    Diagnostic Studies/Procedures    No diagnostics done on this admission _____________   History of Present Illness     Ryan Macdonald is a 84 y.o. male with history of non-ischemic cardiomyopathy (LVEF 20%) status-post Biotronik single-chamber ICD (with floating atrial electrodes on Linox Smart DX ventricular lead), paroxysmal atrial fibrillation on apixiban, type 2 diabetes mellitus, hypertension, hyperlipidemia, who presented on 08/28/18 with acute on chronic systolic heart failure.  He had been recently admitted for acute on chronic combined heart failure and a Myoview revealed moderate reversible perfusion defect.  Was taken for heart cath on 08/18/2018 that revealed 75% stenosis in the proximal LAD treated with DES. There is residual 40% stenosis in the LM and 50% stenosis in the ostial Cx.  Cardiogram on 04/30/2018 showed EF 20-25% with diffuse hypokinesis and grade 2 diastolic dysfunction.  He presented to the emergency department with approximately 24 hours of significant cough productive of clear sputum and generalized malaise in addition to lower extremity edema. No recent dietary indiscretion. He continues to take torsemide in the morning after weighing himself and has not  increased his dose due to stable weight at 248 pounds. No fevers, chills, nausea, vomiting, diarrhea, constipation.  The cough is intermittent. It is accompanied by sneezing after he coughs. There are no significant alleviating factors. He is currently on ciprofloxacin for urinary tract infection. Serum creatinine is 1.77 from 1.43.   Hospital Course     Consultants: None  BNP was elevated at 823.  Patient with prior known ischemic cardiomyopathy with EF 20-25% on 04/30/2018.  He has been diuresed Lasix 40 mg IV twice daily.  Weight is down from 114.7kg to 111.6 kg.  Discharge weight from 08/19/2018 had been 113.9 kg.  He was net -2 L fluid balance for this admission.  Serum creatinine on admission was 1.77 and was 1.79 today - day of discharge.  Avoiding nephrotoxic occasions.  We will follow-up renal function in a week in the office.  He was continued on carvedilol, hydralazine, Isordil.  Not on ACE/ARB secondary to known allergy.  He was transitioned to his home torsemide 20 mg.  His breathing is much better and his lower extremity very mild non pitting edema is at baseline per pt. He feels that he is back to his baseline and ready for discharge.  He was treated for UTI/URI with Cipro which is now complete.  He was advised that he can continue his expectorants and antitussives but avoid stimulant decongestants.  He continues on statin and Plavix for CAD.  No aspirin due to need for anticoagulation.  For his A. fib he continues on amiodarone and apixaban CHA2DS2VASc 5, decreased dose for renal function and age.  He is currently ventricular paced.  Diabetes is well controlled with hemoglobin A1c of 6 on 08/14/2018.  Was seen by physical therapy yesterday who recommended home physical therapy. Arrangements made.  _____________  Discharge Vitals Blood pressure (!) 116/54, pulse 78, temperature 98.1 F (36.7 C), temperature source Oral, resp. rate (!) 27, height 5\' 7"  (1.702 m), weight 111.6 kg,  SpO2 98 %.  Filed Weights   08/29/18 0554 08/30/18 0422 08/31/18 0540  Weight: 112.7 kg 112.3 kg 111.6 kg   Physical Exam  Constitutional: He is oriented to person, place, and time. He appears well-developed and well-nourished. No distress.  HENT:  Head: Normocephalic and atraumatic.  Neck: Normal range of motion. Neck supple. No JVD present.  Cardiovascular: Normal rate, regular rhythm, normal heart sounds and intact distal pulses. Exam reveals no gallop and no friction rub.  No murmur heard. Pulmonary/Chest: Effort normal and breath sounds normal.  Abdominal: Soft. Bowel sounds are normal.  Musculoskeletal: Normal range of motion. He exhibits edema.  Trace lower leg non pitting, chronic edema  Neurological: He is alert and oriented to person, place, and time.  Skin: Skin is warm and dry.  Psychiatric: He has a normal mood and affect. His behavior is normal. Judgment and thought content normal.  Vitals reviewed.   Labs & Radiologic Studies    CBC Recent Labs    08/28/18 1434  WBC 9.6  HGB 11.6*  HCT 37.7*  MCV 99.0  PLT 300   Basic Metabolic Panel Recent Labs    16/10/96 0410 08/31/18 0451  NA 137 136  K 4.0 3.6  CL 104 103  CO2 27 27  GLUCOSE 108* 93  BUN 27* 29*  CREATININE 1.77* 1.79*  CALCIUM 8.4* 8.5*   Liver Function Tests No results for input(s): AST, ALT, ALKPHOS, BILITOT, PROT, ALBUMIN in the last 72 hours. No results for input(s): LIPASE, AMYLASE in the last 72 hours. Cardiac Enzymes Recent Labs    08/28/18 1434 08/28/18 2004  TROPONINI 0.05* 0.04*   BNP Invalid input(s): POCBNP D-Dimer No results for input(s): DDIMER in the last 72 hours. Hemoglobin A1C No results for input(s): HGBA1C in the last 72 hours. Fasting Lipid Panel No results for input(s): CHOL, HDL, LDLCALC, TRIG, CHOLHDL, LDLDIRECT in the last 72 hours. Thyroid Function Tests No results for input(s): TSH, T4TOTAL, T3FREE, THYROIDAB in the last 72 hours.  Invalid input(s):  FREET3 _____________  Nm Myocar Multi W/spect W/wall Motion / Ef  Result Date: 08/16/2018 CLINICAL DATA:  Shortness of breath. Congestive heart failure. Atrial fibrillation. Diabetes, hypertension, and hypercholesterolemia. EXAM: MYOCARDIAL IMAGING WITH SPECT (REST AND PHARMACOLOGIC-STRESS) GATED LEFT VENTRICULAR WALL MOTION STUDY LEFT VENTRICULAR EJECTION FRACTION TECHNIQUE: Standard myocardial SPECT imaging was performed after resting intravenous injection of 10 mCi Tc-49m tetrofosmin. Subsequently, intravenous infusion of Lexiscan was performed under the supervision of the Cardiology staff. At peak effect of the drug, 30 mCi Tc-56m tetrofosmin was injected intravenously and standard myocardial SPECT imaging was performed. Quantitative gated imaging was also performed to evaluate left ventricular wall motion, and estimate left ventricular ejection fraction. COMPARISON:  None. FINDINGS: Perfusion: Large fixed myocardial perfusion defect is seen on both the stress and resting images involving the lateral and inferior walls of the left ventricle. A moderate reversible myocardial perfusion defect is seen involving the distal anteroseptal and apical walls, suspicious for inducible myocardial ischemia. Wall Motion: Mild left ventricular dilatation. Hypokinesis involving the inferior and lateral walls. Left Ventricular Ejection Fraction: 23 % End diastolic volume 185 ml End systolic volume 54 ml IMPRESSION: 1. Moderate reversible perfusion defect involving the distal anteroseptal and apical walls,  suspicious for inducible myocardial ischemia. Large myocardial infarct involving the lateral an inferior walls. 2. Mild left ventricular dilatation, with inferior and lateral wall hypokinesis. 3. Left ventricular ejection fraction 23% 4. Non invasive risk stratification*: High *2012 Appropriate Use Criteria for Coronary Revascularization Focused Update: J Am Coll Cardiol. 2012;59(9):857-881.  http://content.dementiazones.com.aspx?articleid=[email or web address] Electronically Signed   By: Myles Rosenthal M.D.   On: 08/16/2018 15:17   Dg Chest Port 1 View  Result Date: 08/28/2018 CLINICAL DATA:  Patient which generalized weakness. EXAM: PORTABLE CHEST 1 VIEW COMPARISON:  Chest radiograph 08/14/2018 FINDINGS: Single lead AICD device overlies the left hemithorax. Stable cardiomegaly. Bilateral interstitial pulmonary opacities, left-greater-than-right. No pleural effusion. Thoracic spine degenerative changes. IMPRESSION: Cardiomegaly with mild interstitial edema. Electronically Signed   By: Annia Belt M.D.   On: 08/28/2018 15:03   Dg Chest Port 1 View  Result Date: 08/14/2018 CLINICAL DATA:  Respiratory distress EXAM: PORTABLE CHEST 1 VIEW COMPARISON:  07/31/2018 FINDINGS: Single chamber ICD lead into the right ventricle. There is cardiomegaly and aortic tortuosity. Cardiomediastinal contours are distorted by rightward rotation. Vascular congestion without Kerley lines or air bronchogram. No pneumothorax. IMPRESSION: Cardiomegaly and vascular congestion. Electronically Signed   By: Marnee Spring M.D.   On: 08/14/2018 07:18   Disposition   Pt is being discharged home today in good condition.  Follow-up Plans & Appointments    Follow-up Information    Jodelle Gross, NP Follow up.   Specialties:  Nurse Practitioner, Radiology, Cardiology Why:  Cardiology hospital follow-up on Wednesday, October 9 at 9:30 AM.  Please arrive 15 minutes early for check-in.  He will have labs done on this day. Contact information: [address] [place] [address] [phone]        Jobe Gibbon, [facility] Follow up.   Specialty:  Home Health Services Why:  Physical Therapy Contact information: [address] [place] [address] [phone]          Discharge Instructions    (HEART FAILURE PATIENTS) Call MD:  Anytime you have any of the following  symptoms: 1) 3 pound weight gain in 24 hours or 5 pounds in 1 week 2) shortness of breath, with or without a dry hacking cough 3) swelling in the hands, feet or stomach 4) if you have to sleep on extra pillows at night in order to breathe.   Complete by:  As directed    Diet - low sodium heart healthy   Complete by:  As directed    Increase activity slowly   Complete by:  As directed       Discharge Medications   Allergies as of 08/31/2018      Reactions   Lisinopril Swelling   Facial swelling   Xarelto [rivaroxaban] Other (See Comments)   bleeding      Medication List    STOP taking these medications   ciprofloxacin 500 MG tablet Commonly known as:  CIPRO   isosorbide mononitrate 30 MG 24 hr tablet Commonly known as:  IMDUR     TAKE these medications   allopurinol 100 MG tablet Commonly known as:  ZYLOPRIM Take 200 mg by mouth daily. To prevent gout   amiodarone 200 MG tablet Commonly known as:  PACERONE Take 1 tablet (200 mg total) by mouth 2 (two) times daily. What changed:  additional instructions   apixaban 2.5 MG Tabs tablet Commonly known as:  ELIQUIS Take 1 tablet (2.5 mg total) by mouth 2 (two) times  daily. What changed:  additional instructions   aspirin 81 MG EC tablet Take 1 tablet (81 mg total) by mouth daily.   carvedilol 12.5 MG tablet Commonly known as:  COREG Take 0.5 tablets (6.25 mg total) by mouth 2 (two) times daily with a meal. What changed:  how much to take   ciprofloxacin 0.3 % ophthalmic solution Commonly known as:  CILOXAN Place 1 drop into the left eye 4 (four) times daily. Antibiotic eye drops   clopidogrel 75 MG tablet Commonly known as:  PLAVIX Take 1 tablet (75 mg total) by mouth daily with breakfast.   cyanocobalamin 1000 MCG tablet Take 1,000 mcg by mouth daily.   diclofenac sodium 1 % Gel Commonly known as:  VOLTAREN Apply 4 g topically 4 (four) times daily. What changed:    when to take this  reasons to take  this   docusate sodium 100 MG capsule Commonly known as:  COLACE Take 1 capsule (100 mg total) by mouth 2 (two) times daily as needed for mild constipation. What changed:  when to take this   finasteride 5 MG tablet Commonly known as:  PROSCAR Take 5 mg by mouth daily.   fluticasone furoate-vilanterol 100-25 MCG/INH Aepb Commonly known as:  BREO ELLIPTA Inhale 1 puff into the lungs daily.   glipiZIDE 5 MG tablet Commonly known as:  GLUCOTROL Take 5 mg by mouth daily before breakfast.   hydrALAZINE 10 MG tablet Commonly known as:  APRESOLINE Take 1 tablet (10 mg total) by mouth every 8 (eight) hours. What changed:  additional instructions   HYDROcodone-acetaminophen 5-325 MG tablet Commonly known as:  NORCO/VICODIN Take 2 tablets by mouth every 4 (four) hours as needed. What changed:    how much to take  when to take this  reasons to take this   ipratropium-albuterol 0.5-2.5 (3) MG/3ML Soln Commonly known as:  DUONEB Take 3 mLs by nebulization every 4 (four) hours as needed. What changed:  reasons to take this   isosorbide dinitrate 20 MG tablet Commonly known as:  ISORDIL Take 20 mg by mouth 3 (three) times daily.   ketoconazole 2 % shampoo Commonly known as:  NIZORAL Apply 1 application topically 2 (two) times a week.   magnesium oxide 400 MG tablet Commonly known as:  MAG-OX Take 400 mg by mouth daily.   nitroGLYCERIN 0.4 MG SL tablet Commonly known as:  NITROSTAT Place 1 tablet (0.4 mg total) under the tongue every 5 (five) minutes x 3 doses as needed for chest pain.   potassium chloride SA 20 MEQ tablet Commonly known as:  K-DUR,KLOR-CON Take 20 mEq by mouth daily.   prednisoLONE acetate 1 % ophthalmic suspension Commonly known as:  PRED FORTE Place 1 drop into the left eye 4 (four) times daily.   PRESCRIPTION MEDICATION Apply 1 application topically daily as needed (pain). ThermaGel   rosuvastatin 20 MG tablet Commonly known as:  CRESTOR Take  1 tablet (20 mg total) by mouth daily at 6 PM.   sertraline 25 MG tablet Commonly known as:  ZOLOFT Take 25-50 mg by mouth daily as needed (for anxiety).   tamsulosin 0.4 MG Caps capsule Commonly known as:  FLOMAX Take 0.4 mg by mouth at bedtime.   torsemide 20 MG tablet Commonly known as:  DEMADEX Take one 20mg  tablet daily.  However, if weight up 3lbs in 24 hours take an extra dose that day.  If weight is below your dry weight skip your dose that day. What changed:  how much to take  how to take this  when to take this        Acute coronary syndrome (MI, NSTEMI, STEMI, etc) this admission?: No.    Outstanding Labs/Studies   Bmet at follow up  Duration of Discharge Encounter   Greater than 30 minutes including physician time.  Signed, Berton Bon, NP 08/31/2018, 11:54 AM --------------------------------------------   History and all data above reviewed.  Patient examined.  I agree with the findings as above.  Ryan Macdonald is doing well, and ready to be home with his wife.  Exam:  Gen: NAD CV: regular rhythm, normal rate.  Lungs: clear bilaterally MSK: mild bilateral edema Neuro: seen ambulating well without gait aid, nonfocal Psych: normal mood and affect  All available labs, radiology testing, previous records reviewed. Agree with documented assessment and plan of my colleague as stated above with the following additions or changes:  Active Problems:   Atrial fibrillation (HCC)   ICD (implantable cardioverter-defibrillator) in place   Diabetes mellitus with complication (HCC)   Acute on chronic systolic (congestive) heart failure (HCC)   URI (upper respiratory infection)   UTI (urinary tract infection)  Plan: Patient has completed Cipro for UTI/URI, and is feeling better. He will go home on his home medication regimen, and it is ok if he takes an expectorant with antitussive, so long as there is no stimulant decongestant in it.   F/u with  cardiology as an outpatient.  Parke Poisson, MD [facility] 12:44 PM  08/31/2018

## 2018-08-31 NOTE — Telephone Encounter (Signed)
TOC Patient-  Please call Patient-  Pt has ana appointment on 09-07-18 with [name].

## 2018-08-31 NOTE — Care Management Note (Signed)
Case Management Note  Patient Details  Name: Ryan Macdonald MRN: [id number] Date of Birth: [birth year]  Subjective/Objective: Pt presented for CHF- PTA from home with wife. Plan will be transition back home with [facility] PT once stable. 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 Pt has PCP Dr. Tommy Rainwater @ the [facility]. (t) [phone]; (f) [phone]. Agency List offered to patient and he chose [facility].                     Action/Plan: Referral for [facility] made to Eugenio Hoes with [facility] to begin within 24-48 hours post transition home. If prescriptions are new for home and faxed on day of d/c, the [facility] will [facility] him [facility] the next day. CM did call the [facility] fees Coordinator Peyton Najjar to make them aware that patient has been hospitalized. CM will fax Medications to [place]- Per pt he has transportation home. No further needs from CM at this time.   Expected Discharge Date:                  Expected Discharge Plan:  Home w Home Health Services  In-House Referral:  NA  Discharge planning Services  CM Consult  Post Acute Care Choice:  Home Health Choice offered to:  Patient  DME Arranged:  N/A DME Agency:  NA  HH Arranged:  PT HH Agency:  [facility]  Status of Service:  Completed, signed off  If discussed at Long Length of Stay Meetings, dates discussed:    Additional Comments:  Gala Lewandowsky, RN 08/31/2018, 10:58 AM

## 2018-08-31 NOTE — Care Management Important Message (Signed)
Important Message  Patient Details  Name: Ryan Macdonald MRN: [id number] Date of Birth: [birth year]   Medicare Important Message Given:  Yes    Katherine Syme P [name] 08/31/2018, 1:30 PM

## 2018-08-31 NOTE — Progress Notes (Signed)
Physical Therapy Treatment Patient Details Name: Ryan Macdonald MRN: [id number] DOB: Apr 05, 1934 Today's Date: 08/31/2018    History of Present Illness Pt is an 84 y/o male admitted secondary to progressive weakness and cough, likely secondary to CHF exacerbation. PMH includes a fib, L THA, DM, CHF, nonischemic cardiomyopathy s/p ICD.     PT Comments    Mr. Cost very pleasant and motivated to return home today. Ambulating hallway distances ~250 feet without AD at general supervision level assist with no LOB or overt instability. Will continue to recommend HHPT at discharge to continue to progress safe and independent functional mobility.     Follow Up Recommendations  Home health PT     Equipment Recommendations  None recommended by PT    Recommendations for Other Services       Precautions / Restrictions Precautions Precautions: None Restrictions Weight Bearing Restrictions: No    Mobility  Bed Mobility Overal bed mobility: Modified Independent                Transfers Overall transfer level: Needs assistance Equipment used: None Transfers: Sit to/from Stand Sit to Stand: Min guard;Supervision         General transfer comment: for safety and immediate standing balance  Ambulation/Gait Ambulation/Gait assistance: Supervision;Min guard Gait Distance (Feet): 250 Feet Assistive device: None Gait Pattern/deviations: Step-through pattern;Decreased stride length;Trunk flexed;Shuffle Gait velocity: decreased    General Gait Details: some shuffle steps; states "I'm 83 this is just how I walk"   Stairs             Wheelchair Mobility    Modified Rankin (Stroke Patients Only)       Balance Overall balance assessment: Needs assistance Sitting-balance support: Feet supported;No upper extremity supported Sitting balance-Leahy Scale: Good     Standing balance support: No upper extremity supported;During functional activity;Single extremity  supported Standing balance-Leahy Scale: Fair                              Cognition Arousal/Alertness: Awake/alert Behavior During Therapy: WFL for tasks assessed/performed Overall Cognitive Status: Within Functional Limits for tasks assessed                                        Exercises      General Comments        Pertinent Vitals/Pain Pain Assessment: No/denies pain    Home Living                      Prior Function            PT Goals (current goals can now be found in the care plan section) Acute Rehab PT Goals Patient Stated Goal: to go home  PT Goal Formulation: With patient Time For Goal Achievement: 09/13/18 Potential to Achieve Goals: Good Progress towards PT goals: Progressing toward goals    Frequency    Min 3X/week      PT Plan Current plan remains appropriate    Co-evaluation              AM-PAC PT "6 Clicks" Daily Activity  Outcome Measure  Difficulty turning over in bed (including adjusting bedclothes, sheets and blankets)?: A Little Difficulty moving from lying on back to sitting on the side of the bed? : A Little Difficulty sitting down on and standing up  from a chair with arms (e.g., wheelchair, bedside commode, etc,.)?: Unable Help needed moving to and from a bed to chair (including a wheelchair)?: A Little Help needed walking in hospital room?: A Little Help needed climbing 3-5 steps with a railing? : A Little 6 Click Score: 16    End of Session Equipment Utilized During Treatment: Gait belt Activity Tolerance: Patient tolerated treatment well Patient left: in bed;with call bell/phone within reach Nurse Communication: Mobility status PT Visit Diagnosis: Muscle weakness (generalized) (M62.81);Other abnormalities of gait and mobility (R26.89)     Time: 4098-1191 PT Time Calculation (min) (ACUTE ONLY): 28 min  Charges:  $Gait Training: 8-22 mins $Therapeutic Activity: 8-22 mins                      Kipp Laurence, PT, DPT Supplemental Physical Therapist 08/31/18 12:07 PM Pager: [phone] Office: [phone]

## 2018-09-01 ENCOUNTER — Other Ambulatory Visit: Payer: Self-pay

## 2018-09-01 ENCOUNTER — Telehealth (HOSPITAL_COMMUNITY): Payer: Self-pay

## 2018-09-01 NOTE — Telephone Encounter (Signed)
Called patient to see if he is interested in the Cardiac Rehab Program. Patient expressed interest. Explained scheduling process, pt has [facility] for insurance. I did adv pt he would have to contact the [place] for authorization, pt stated he understood. And will give them a call Monday.

## 2018-09-04 ENCOUNTER — Telehealth: Payer: Self-pay | Admitting: Cardiovascular Disease

## 2018-09-04 NOTE — Telephone Encounter (Signed)
Patient contacted regarding discharge from [facility] on Thursday, 08/31/18.  Patient understands to follow up with provider [name], NP on 09/06/18 at 9:30 at [facility]. Patient understands discharge instructions? yes Patient understands medications and regiment? yes Patient understands to bring all medications to this visit? yes

## 2018-09-04 NOTE — Telephone Encounter (Signed)
New Message:      [name] with [facility] is requesting a verbal order for Physical Therapy   [place]: [phone]

## 2018-09-05 NOTE — Progress Notes (Signed)
Cardiology Office Note   Date:  09/06/2018   ID:  Ryan Macdonald, DOB 20-Oct-1934, MRN [id number]  PCP:  [facility]-Dr. Tereasa Coop   Cardiologist: Dr. Duke Salvia  Chief Complaint  Patient presents with  . Hemoptysis  . Coronary Artery Disease  . Atrial Fibrillation     History of Present Illness: Ryan Macdonald is a 84 y.o. male who presents for post hospitalization after admission for coughing, malaise, and LEE. He has a history of CAD, with cardiac cath 07/2018 revealing 75% stenosis of the proximal LAD, treated with DES. There was residual stenosis in the LM and 50% stenosis in the ostial Cx. Echo revealed EF of 20%-25% in 04/2018.   Other history includes Type II diabetes, ICD in situ, atrial fib, and chronic systolic CHF.  He was treated with IV diuretics with 6 lbs weight loss. He was found to have serum creatinine of 1.79 on discharge. He will need repeat BMET today, He was continued on carvedilol, torsemide, hydralizine, and isordil. He is allergic to ACE/ARB.    He was placed on antibiotic therapy for URI and UTI. He continues a hacking cough, unable to sleep at night, due to worsening symptoms. He has not seen his PCP on follow up yet.   Past Medical History:  Diagnosis Date  . Atrial fibrillation (HCC)   . Cardiomyopathy (HCC)   . Cat scratch fever    removed mass on right side neck  . Cataracts, bilateral   . CHF (congestive heart failure) (HCC)   . Chronic kidney disease   . Corns and callosities   . Diabetes mellitus without complication (HCC)    TYPE 2  . Dyspnea   . Erythema intertrigo   . Flat foot   . Gout   . High cholesterol   . Hx of urethral stricture   . Hypertension   . Kidney stones   . Morbid obesity (HCC)   . Nonischemic cardiomyopathy (HCC)    a. ? 2009 Cath in [place] - nl cors per pt;  b. 09/2013 Echo: EF 25-30%, sev diff HK.  . Obesity   . Osteoarthritis   . PAF (paroxysmal atrial fibrillation) (HCC)    a. post-op hip in 2014 - prev on  xarelto.  . Renal insufficiency     Past Surgical History:  Procedure Laterality Date  . CARDIAC CATHETERIZATION  1980's  . CIRCUMCISION  1940's  . CORONARY STENT INTERVENTION N/A 08/18/2018   Procedure: CORONARY STENT INTERVENTION;  Surgeon: Corky Crafts, MD;  Location: [facility] INVASIVE CV LAB;  Service: Cardiovascular;  Laterality: N/A;  . CYSTOSCOPY WITH URETHRAL DILATATION  10/23/2013  . CYSTOSCOPY WITH URETHRAL DILATATION N/A 10/23/2013   Procedure: CYSTOSCOPY WITH URETHRAL DILATATION;  Surgeon: Kathi Ludwig, MD;  Location: [facility] OR;  Service: Urology;  Laterality: N/A;  . INCISION AND DRAINAGE OF WOUND Right 1980's   "cat scratch" (10/23/2013)  . INGUINAL HERNIA REPAIR Right 1950's  . INTRAVASCULAR ULTRASOUND/IVUS N/A 08/18/2018   Procedure: Intravascular Ultrasound/IVUS;  Surgeon: Corky Crafts, MD;  Location: [facility] INVASIVE CV LAB;  Service: Cardiovascular;  Laterality: N/A;  . KIDNEY STONE SURGERY Right 1970's; 1983   "twice"  . LEFT HEART CATH AND CORONARY ANGIOGRAPHY N/A 08/18/2018   Procedure: LEFT HEART CATH AND CORONARY ANGIOGRAPHY;  Surgeon: Corky Crafts, MD;  Location: [facility] INVASIVE CV LAB;  Service: Cardiovascular;  Laterality: N/A;  . TONSILLECTOMY AND ADENOIDECTOMY  1940's  . TOTAL HIP ARTHROPLASTY Left 10/23/2013   Procedure: TOTAL HIP ARTHROPLASTY;  Surgeon: Valeria Batman, MD;  Location: [facility] OR;  Service: Orthopedics;  Laterality: Left;     Current Outpatient Medications  Medication Sig Dispense Refill  . allopurinol (ZYLOPRIM) 100 MG tablet Take 200 mg by mouth daily. To prevent gout    . amiodarone (PACERONE) 200 MG tablet Take 1 tablet (200 mg total) by mouth 2 (two) times daily. (Patient taking differently: Take 200 mg by mouth 2 (two) times daily. For heart rhythm control) 60 tablet 0  . apixaban (ELIQUIS) 2.5 MG TABS tablet Take 1 tablet (2.5 mg total) by mouth 2 (two) times daily. (Patient taking differently: Take 2.5 mg by mouth 2 (two)  times daily. Caution - blood thinner) 90 tablet 0  . aspirin EC 81 MG EC tablet Take 1 tablet (81 mg total) by mouth daily.    . carvedilol (COREG) 12.5 MG tablet Take 0.5 tablets (6.25 mg total) by mouth 2 (two) times daily with a meal. (Patient taking differently: Take 12.5 mg by mouth 2 (two) times daily with a meal. ) 30 tablet 0  . ciprofloxacin (CILOXAN) 0.3 % ophthalmic solution Place 1 drop into the left eye 4 (four) times daily. Antibiotic eye drops    . clopidogrel (PLAVIX) 75 MG tablet Take 1 tablet (75 mg total) by mouth daily with breakfast. 30 tablet 0  . cyanocobalamin 1000 MCG tablet Take 1,000 mcg by mouth daily.     . diclofenac sodium (VOLTAREN) 1 % GEL Apply 4 g topically 4 (four) times daily. (Patient taking differently: Apply 4 g topically 4 (four) times daily as needed (pain). ) 1 Tube 0  . docusate sodium (COLACE) 100 MG capsule Take 1 capsule (100 mg total) by mouth 2 (two) times daily as needed for mild constipation. (Patient taking differently: Take 100 mg by mouth daily. ) 10 capsule 0  . finasteride (PROSCAR) 5 MG tablet Take 5 mg by mouth daily.     . fluticasone furoate-vilanterol (BREO ELLIPTA) 100-25 MCG/INH AEPB Inhale 1 puff into the lungs daily. 60 each 3  . glipiZIDE (GLUCOTROL) 5 MG tablet Take 5 mg by mouth daily before breakfast.    . hydrALAZINE (APRESOLINE) 10 MG tablet Take 1 tablet (10 mg total) by mouth every 8 (eight) hours. (Patient taking differently: Take 10 mg by mouth every 8 (eight) hours. For blood pressure) 90 tablet 0  . HYDROcodone-acetaminophen (NORCO/VICODIN) 5-325 MG tablet Take 2 tablets by mouth every 4 (four) hours as needed. (Patient taking differently: Take 1 tablet by mouth 2 (two) times daily as needed for moderate pain. ) 6 tablet 0  . ipratropium-albuterol (DUONEB) 0.5-2.5 (3) MG/3ML SOLN Take 3 mLs by nebulization every 4 (four) hours as needed. (Patient taking differently: Take 3 mLs by nebulization every 4 (four) hours as needed  (shortness of breath). ) 360 mL 0  . isosorbide dinitrate (ISORDIL) 20 MG tablet Take 20 mg by mouth 3 (three) times daily.    Marland Kitchen ketoconazole (NIZORAL) 2 % shampoo Apply 1 application topically 2 (two) times a week.    . magnesium oxide (MAG-OX) 400 MG tablet Take 400 mg by mouth daily.    . nitroGLYCERIN (NITROSTAT) 0.4 MG SL tablet Place 1 tablet (0.4 mg total) under the tongue every 5 (five) minutes x 3 doses as needed for chest pain. 25 tablet 0  . potassium chloride SA (K-DUR,KLOR-CON) 20 MEQ tablet Take 20 mEq by mouth daily.    . prednisoLONE acetate (PRED FORTE) 1 % ophthalmic suspension Place 1 drop into  the left eye 4 (four) times daily.    Marland Kitchen PRESCRIPTION MEDICATION Apply 1 application topically daily as needed (pain). ThermaGel    . rosuvastatin (CRESTOR) 20 MG tablet Take 1 tablet (20 mg total) by mouth daily at 6 PM. 90 tablet 2  . sertraline (ZOLOFT) 25 MG tablet Take 25-50 mg by mouth daily as needed (for anxiety).     . tamsulosin (FLOMAX) 0.4 MG CAPS capsule Take 0.4 mg by mouth at bedtime.    . torsemide (DEMADEX) 20 MG tablet Take one 20mg  tablet daily.  However, if weight up 3lbs in 24 hours take an extra dose that day.  If weight is below your dry weight skip your dose that day. (Patient taking differently: Take 20 mg by mouth See admin instructions. Take one 20mg  tablet daily.  However, if weight up 3lbs in 24 hours take an extra dose that day.  If weight is below your dry weight skip your dose that day.) 120 tablet 3  . benzonatate (TESSALON) 100 MG capsule Take 1 capsule (100 mg total) by mouth 3 (three) times daily as needed for cough. 60 capsule 0  . chlorpheniramine-HYDROcodone (TUSSIONEX PENNKINETIC ER) 10-8 MG/5ML SUER Take 5 mLs by mouth every 12 (twelve) hours as needed for cough. 140 mL 0   No current facility-administered medications for this visit.     Allergies:   Lisinopril and Xarelto [rivaroxaban]    Social History:  The patient  reports that he has quit  smoking. His smoking use included cigarettes and cigars. He has a 15.00 pack-year smoking history. He has quit using smokeless tobacco. He reports that he does not drink alcohol or use drugs.   Family History:  The patient's family history includes Heart Problems in his maternal grandmother; Heart disease in his mother; Other in his unknown relative.    ROS: All other systems are reviewed and negative. Unless otherwise mentioned in H&P    PHYSICAL EXAM: VS:  BP 115/70   Pulse 69   Ht 5\' 7"  (1.702 m)   Wt 253 lb 9.6 oz (115 kg)   BMI 39.72 kg/m  , BMI Body mass index is 39.72 kg/m. GEN: Well nourished, well developed, in no acute distress HEENT: normal Neck: no JVD, carotid bruits, or masses Cardiac: RRR; no murmurs, rubs, or gallops,no edema  Respiratory:  Clear to auscultation bilaterally, normal work of breathing,  Frequent coughing. Some upper airway wheezing. Cleared with coughing.  GI: soft, nontender, nondistended, + BS MS: no deformity or atrophy Skin: warm and dry, no rash Neuro:  Strength and sensation are intact Psych: euthymic mood, full affect   EKG:  Not completed this office visit.   Recent Labs: 02/16/2018: Magnesium 1.8 2018/05/10: ALT 25 08/28/2018: B Natriuretic Peptide 823.0; Hemoglobin 11.6; Platelets 300 08/31/2018: BUN 29; Creatinine, Ser 1.79; Potassium 3.6; Sodium 136    Lipid Panel    Component Value Date/Time   CHOL 159 08/15/2018 0207   TRIG 89 08/15/2018 0207   HDL 32 (L) 08/15/2018 0207   CHOLHDL 5.0 08/15/2018 0207   VLDL 18 08/15/2018 0207   LDLCALC 109 (H) 08/15/2018 0207      Wt Readings from Last 3 Encounters:  09/06/18 253 lb 9.6 oz (115 kg)  08/31/18 246 lb 1.6 oz (111.6 kg)  08/19/18 251 lb 1.7 oz (113.9 kg)      Other studies Reviewed: Echocardiogram 2018/05/10 - Left ventricle: The cavity size was moderately dilated. Wall   thickness was normal. Systolic function was  severely reduced. The   estimated ejection fraction was in  the range of 20% to 25%.   Diffuse hypokinesis. Features are consistent with a pseudonormal   left ventricular filling pattern, with concomitant abnormal   relaxation and increased filling pressure (grade 2 diastolic   dysfunction). Doppler parameters are consistent with high   ventricular filling pressure. - Aortic valve: There was trivial regurgitation. - Mitral valve: There was mild regurgitation. Valve area by   pressure half-time: 1.09 cm^2. - Left atrium: The atrium was mildly dilated. - Right ventricle: Pacer wire or catheter noted in right ventricle.   Systolic function was reduced.  Cardiac Cath 08/18/2018 Conclusion     Mid LM to Dist LM lesion is 40% stenosed. Cross sectional area 6.7 cm2.  Ost Cx lesion is 50% stenosed. Cross sectional area 5.5 cm2.  Mid LAD lesion is 50% stenosed.  LV end diastolic pressure is normal. LVEDP 12 mm Hg.  There is no aortic valve stenosis.  Prox LAD lesion is 75% stenosed. Severely calcified. Cross sectional area 3.2 and heavily calcified.  A drug-eluting stent was successfully placed using a STENT SYNERGY DES 3X12, postdilated to 3.3 mm.  Post intervention, there is a 0% residual stenosis.     Recommend to resume Apixaban, at currently prescribed dose and frequency, on 08/19/18.  Recommend concurrent antiplatelet therapy of Aspirin 81mg  daily for 1 month and Clopidogrel 75mg  daily for 6 months.       ASSESSMENT AND PLAN:  1. CAD: S/P cardiac cath on 08/18/2018 demonstrating proximal LAD stenosis of 75%, with DES placed reducing this to 0%. He has had no further symptoms of chest pressure. He is to continue ASA for one month then take only Plavix and Eliquis. Plavix for 6 months, Continue carvedilol.  2. ICM: EF of 25%. He has ICD in situ. He remains on hydralazine for BP control, torsemide for volume stability. He continues to weigh daily, but is still struggling with salt. His weight is up 4 lbs since discharge, but scales are  different. He is to continue his home weights as directed.   3. Upper Respiratory Infection: He continues to have a cough, which is causing him to lose sleep and be fatigued during the day due to frequency. I have given him Tessalon Perles 100 mg TID prn, and Tussinex Q 12 hours for symptomatic relief. . No refills on these medications as he will see his PCP on follow up for ongoing management and pulmonary referral if necessary. He is advised NOT to take pain medication with Tussinex. He verbalizes understanding. 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   4.Atrial fib: Heart rate is controlled. Continue apixaban at renal dose along with amiodarone. Can consider stopping amiodarone if coughing persists.    Current medicines are reviewed at length with the patient today.    Labs/ tests ordered today include: none  Bettey Mare. Liborio Nixon, ANP, AACC   09/06/2018 10:09 AM    [facility] [address] Office [phone] Fax [phone]

## 2018-09-06 ENCOUNTER — Ambulatory Visit (INDEPENDENT_AMBULATORY_CARE_PROVIDER_SITE_OTHER): Payer: Non-veteran care | Admitting: Adult Health

## 2018-09-06 ENCOUNTER — Encounter: Payer: Self-pay | Admitting: Adult Health

## 2018-09-06 VITALS — BP 115/70 | HR 69 | Ht 67.0 in | Wt 253.6 lb

## 2018-09-06 DIAGNOSIS — I480 Paroxysmal atrial fibrillation: Secondary | ICD-10-CM

## 2018-09-06 DIAGNOSIS — I43 Cardiomyopathy in diseases classified elsewhere: Secondary | ICD-10-CM

## 2018-09-06 DIAGNOSIS — R05 Cough: Secondary | ICD-10-CM | POA: Diagnosis not present

## 2018-09-06 DIAGNOSIS — R059 Cough, unspecified: Secondary | ICD-10-CM

## 2018-09-06 DIAGNOSIS — I2510 Atherosclerotic heart disease of native coronary artery without angina pectoris: Secondary | ICD-10-CM

## 2018-09-06 MED ORDER — HYDROCOD POLST-CPM POLST ER 10-8 MG/5ML PO SUER: 5.0000 mL | Freq: Two times a day (BID) | ORAL | 0 refills | Status: DC | PRN

## 2018-09-06 MED ORDER — BENZONATATE 100 MG PO CAPS: 100.0000 mg | ORAL_CAPSULE | Freq: Three times a day (TID) | ORAL | 0 refills | Status: DC | PRN

## 2018-09-06 NOTE — Patient Instructions (Signed)
Medication Instructions:  TAKE TESSALON THREE TIMES DAILY AS NEEDED ONLY  TAKE TUSSIONEX EVERY 12 HOURS AS NEEDED ONLY  If you need a refill on your cardiac medications before your next appointment, please call your pharmacy.  Labwork: If you have labs (blood work) drawn today and your tests are completely normal, you will receive your results only by: Marland Kitchen MyChart Message (if you have MyChart) OR . A paper copy in the mail If you have any lab test that is abnormal or we need to change your treatment, we will call you to review the results.  Follow-Up: [facility]  At [facility], you and your health needs are our priority.  As part of our continuing mission to provide you with exceptional heart care, we have created designated Provider Care Teams.  These Care Teams include your primary Cardiologist (physician) and Advanced Practice Providers (APPs -  Physician Assistants and Nurse Practitioners) who all work together to provide you with the care you need, when you need it. You will need a follow up appointment in KEEP SCHEDULED APPOINTMENT.  You may see  [facility] or one of the following Advanced Practice Providers on your designated Care Team.  Thank you for choosing [facility] at [facility]!!

## 2018-09-06 NOTE — Telephone Encounter (Signed)
Spoke with [name] and ok to evaluate and treat. She stated she thought patient could benefit from [facility] as well and PT. Per Dr Duke Salvia ok for PT and nursing, verbal ok given

## 2018-09-08 ENCOUNTER — Encounter (HOSPITAL_COMMUNITY): Payer: Self-pay | Admitting: *Deleted

## 2018-09-08 ENCOUNTER — Other Ambulatory Visit: Payer: Self-pay

## 2018-09-08 ENCOUNTER — Emergency Department (HOSPITAL_COMMUNITY): Payer: No Typology Code available for payment source

## 2018-09-08 ENCOUNTER — Encounter (HOSPITAL_COMMUNITY): Payer: Self-pay | Admitting: Emergency Medicine

## 2018-09-08 ENCOUNTER — Emergency Department (HOSPITAL_COMMUNITY)
Admission: EM | Admit: 2018-09-08 | Discharge: 2018-09-08 | Disposition: A | Discharge status: Home / Self Care | Payer: No Typology Code available for payment source | Attending: Emergency Medicine | Admitting: Emergency Medicine

## 2018-09-08 ENCOUNTER — Observation Stay (HOSPITAL_COMMUNITY)
Admission: EM | Admit: 2018-09-08 | Discharge: 2018-09-10 | Disposition: A | Discharge status: Home / Self Care | Payer: No Typology Code available for payment source | Attending: Internal Medicine | Admitting: Internal Medicine

## 2018-09-08 DIAGNOSIS — G8929 Other chronic pain: Secondary | ICD-10-CM | POA: Insufficient documentation

## 2018-09-08 DIAGNOSIS — I509 Heart failure, unspecified: Secondary | ICD-10-CM

## 2018-09-08 DIAGNOSIS — D649 Anemia, unspecified: Secondary | ICD-10-CM | POA: Diagnosis not present

## 2018-09-08 DIAGNOSIS — I472 Ventricular tachycardia: Secondary | ICD-10-CM | POA: Diagnosis not present

## 2018-09-08 DIAGNOSIS — Z6838 Body mass index (BMI) 38.0-38.9, adult: Secondary | ICD-10-CM | POA: Diagnosis not present

## 2018-09-08 DIAGNOSIS — I429 Cardiomyopathy, unspecified: Secondary | ICD-10-CM | POA: Diagnosis not present

## 2018-09-08 DIAGNOSIS — M549 Dorsalgia, unspecified: Secondary | ICD-10-CM | POA: Insufficient documentation

## 2018-09-08 DIAGNOSIS — I5043 Acute on chronic combined systolic (congestive) and diastolic (congestive) heart failure: Secondary | ICD-10-CM | POA: Diagnosis present

## 2018-09-08 DIAGNOSIS — Z794 Long term (current) use of insulin: Secondary | ICD-10-CM

## 2018-09-08 DIAGNOSIS — E8770 Fluid overload, unspecified: Secondary | ICD-10-CM | POA: Diagnosis present

## 2018-09-08 DIAGNOSIS — R8271 Bacteriuria: Secondary | ICD-10-CM

## 2018-09-08 DIAGNOSIS — M109 Gout, unspecified: Secondary | ICD-10-CM | POA: Diagnosis not present

## 2018-09-08 DIAGNOSIS — I2510 Atherosclerotic heart disease of native coronary artery without angina pectoris: Secondary | ICD-10-CM | POA: Insufficient documentation

## 2018-09-08 DIAGNOSIS — E119 Type 2 diabetes mellitus without complications: Secondary | ICD-10-CM | POA: Insufficient documentation

## 2018-09-08 DIAGNOSIS — N400 Enlarged prostate without lower urinary tract symptoms: Secondary | ICD-10-CM | POA: Insufficient documentation

## 2018-09-08 DIAGNOSIS — Z79899 Other long term (current) drug therapy: Secondary | ICD-10-CM | POA: Insufficient documentation

## 2018-09-08 DIAGNOSIS — M1612 Unilateral primary osteoarthritis, left hip: Secondary | ICD-10-CM | POA: Insufficient documentation

## 2018-09-08 DIAGNOSIS — I480 Paroxysmal atrial fibrillation: Secondary | ICD-10-CM | POA: Diagnosis present

## 2018-09-08 DIAGNOSIS — E1122 Type 2 diabetes mellitus with diabetic chronic kidney disease: Principal | ICD-10-CM | POA: Insufficient documentation

## 2018-09-08 DIAGNOSIS — Z87442 Personal history of urinary calculi: Secondary | ICD-10-CM | POA: Insufficient documentation

## 2018-09-08 DIAGNOSIS — Z7982 Long term (current) use of aspirin: Secondary | ICD-10-CM | POA: Diagnosis not present

## 2018-09-08 DIAGNOSIS — I5023 Acute on chronic systolic (congestive) heart failure: Secondary | ICD-10-CM | POA: Diagnosis not present

## 2018-09-08 DIAGNOSIS — F329 Major depressive disorder, single episode, unspecified: Secondary | ICD-10-CM | POA: Insufficient documentation

## 2018-09-08 DIAGNOSIS — Z87440 Personal history of urinary (tract) infections: Secondary | ICD-10-CM | POA: Diagnosis not present

## 2018-09-08 DIAGNOSIS — Z87891 Personal history of nicotine dependence: Secondary | ICD-10-CM | POA: Diagnosis not present

## 2018-09-08 DIAGNOSIS — R531 Weakness: Secondary | ICD-10-CM | POA: Diagnosis present

## 2018-09-08 DIAGNOSIS — Z79891 Long term (current) use of opiate analgesic: Secondary | ICD-10-CM

## 2018-09-08 DIAGNOSIS — I130 Hypertensive heart and chronic kidney disease with heart failure and stage 1 through stage 4 chronic kidney disease, or unspecified chronic kidney disease: Secondary | ICD-10-CM | POA: Insufficient documentation

## 2018-09-08 DIAGNOSIS — N183 Chronic kidney disease, stage 3 (moderate): Secondary | ICD-10-CM | POA: Diagnosis not present

## 2018-09-08 DIAGNOSIS — E7800 Pure hypercholesterolemia, unspecified: Secondary | ICD-10-CM | POA: Diagnosis not present

## 2018-09-08 DIAGNOSIS — Z7951 Long term (current) use of inhaled steroids: Secondary | ICD-10-CM

## 2018-09-08 DIAGNOSIS — Z7902 Long term (current) use of antithrombotics/antiplatelets: Secondary | ICD-10-CM | POA: Insufficient documentation

## 2018-09-08 DIAGNOSIS — I110 Hypertensive heart disease with heart failure: Secondary | ICD-10-CM | POA: Diagnosis not present

## 2018-09-08 DIAGNOSIS — J449 Chronic obstructive pulmonary disease, unspecified: Secondary | ICD-10-CM | POA: Insufficient documentation

## 2018-09-08 DIAGNOSIS — E872 Acidosis: Secondary | ICD-10-CM | POA: Insufficient documentation

## 2018-09-08 DIAGNOSIS — Z7901 Long term (current) use of anticoagulants: Secondary | ICD-10-CM | POA: Insufficient documentation

## 2018-09-08 DIAGNOSIS — N35919 Unspecified urethral stricture, male, unspecified site: Secondary | ICD-10-CM | POA: Insufficient documentation

## 2018-09-08 DIAGNOSIS — Z955 Presence of coronary angioplasty implant and graft: Secondary | ICD-10-CM | POA: Insufficient documentation

## 2018-09-08 DIAGNOSIS — J069 Acute upper respiratory infection, unspecified: Secondary | ICD-10-CM | POA: Insufficient documentation

## 2018-09-08 DIAGNOSIS — Z8249 Family history of ischemic heart disease and other diseases of the circulatory system: Secondary | ICD-10-CM | POA: Insufficient documentation

## 2018-09-08 DIAGNOSIS — F419 Anxiety disorder, unspecified: Secondary | ICD-10-CM

## 2018-09-08 DIAGNOSIS — Z950 Presence of cardiac pacemaker: Secondary | ICD-10-CM

## 2018-09-08 DIAGNOSIS — Z95810 Presence of automatic (implantable) cardiac defibrillator: Secondary | ICD-10-CM | POA: Insufficient documentation

## 2018-09-08 DIAGNOSIS — Z888 Allergy status to other drugs, medicaments and biological substances status: Secondary | ICD-10-CM | POA: Insufficient documentation

## 2018-09-08 DIAGNOSIS — I4891 Unspecified atrial fibrillation: Secondary | ICD-10-CM | POA: Diagnosis present

## 2018-09-08 DIAGNOSIS — E785 Hyperlipidemia, unspecified: Secondary | ICD-10-CM

## 2018-09-08 DIAGNOSIS — Z7984 Long term (current) use of oral hypoglycemic drugs: Secondary | ICD-10-CM | POA: Insufficient documentation

## 2018-09-08 LAB — BASIC METABOLIC PANEL
ANION GAP: 10 (ref 5–15)
Anion gap: 11 (ref 5–15)
BUN: 31 mg/dL — ABNORMAL HIGH (ref 8–23)
BUN: 26 mg/dL — AB (ref 8–23)
CALCIUM: 8.6 mg/dL — AB (ref 8.9–10.3)
CALCIUM: 8.7 mg/dL — AB (ref 8.9–10.3)
CHLORIDE: 106 mmol/L (ref 98–111)
CHLORIDE: 103 mmol/L (ref 98–111)
CO2: 23 mmol/L (ref 22–32)
CO2: 25 mmol/L (ref 22–32)
CREATININE: 1.59 mg/dL — AB (ref 0.61–1.24)
Creatinine, Ser: 1.77 mg/dL — ABNORMAL HIGH (ref 0.61–1.24)
GFR calc non Af Amer: 34 mL/min — ABNORMAL LOW (ref >60)
GFR calc non Af Amer: 39 mL/min — ABNORMAL LOW (ref >60)
GFR, EST AFRICAN AMERICAN: 39 mL/min — AB (ref >60)
GFR, EST AFRICAN AMERICAN: 45 mL/min — AB (ref >60)
GLUCOSE: 100 mg/dL — AB (ref 70–99)
Glucose, Bld: 170 mg/dL — ABNORMAL HIGH (ref 70–99)
Potassium: 4.7 mmol/L (ref 3.5–5.1)
Potassium: 3.9 mmol/L (ref 3.5–5.1)
SODIUM: 139 mmol/L (ref 135–145)
Sodium: 139 mmol/L (ref 135–145)

## 2018-09-08 LAB — URINALYSIS, ROUTINE W REFLEX MICROSCOPIC
Bilirubin Urine: NEGATIVE
Glucose, UA: NEGATIVE mg/dL
Ketones, ur: NEGATIVE mg/dL
NITRITE: POSITIVE — AB
PROTEIN: NEGATIVE mg/dL
Specific Gravity, Urine: 1.009 (ref 1.005–1.030)
WBC, UA: >50 WBC/hpf — ABNORMAL HIGH (ref 0–5)
pH: 5 (ref 5.0–8.0)

## 2018-09-08 LAB — CBC
HEMATOCRIT: 35.3 % — AB (ref 39.0–52.0)
Hemoglobin: 10.7 g/dL — ABNORMAL LOW (ref 13.0–17.0)
MCH: 29.7 pg (ref 26.0–34.0)
MCHC: 30.3 g/dL (ref 30.0–36.0)
MCV: 98.1 fL (ref 80.0–100.0)
NRBC: 0 % (ref 0.0–0.2)
PLATELETS: 228 10*3/uL (ref 150–400)
RBC: 3.6 MIL/uL — AB (ref 4.22–5.81)
RDW: 15.1 % (ref 11.5–15.5)
WBC: 7.9 10*3/uL (ref 4.0–10.5)

## 2018-09-08 LAB — CBC WITH DIFFERENTIAL/PLATELET
Abs Immature Granulocytes: 0.02 10*3/uL (ref 0.00–0.07)
BASOS ABS: 0 10*3/uL (ref 0.0–0.1)
Basophils Relative: 1 %
EOS ABS: 0.2 10*3/uL (ref 0.0–0.5)
EOS PCT: 2 %
HEMATOCRIT: 32.8 % — AB (ref 39.0–52.0)
Hemoglobin: 10.3 g/dL — ABNORMAL LOW (ref 13.0–17.0)
Immature Granulocytes: 0 %
LYMPHS ABS: 1 10*3/uL (ref 0.7–4.0)
Lymphocytes Relative: 14 %
MCH: 30.3 pg (ref 26.0–34.0)
MCHC: 31.4 g/dL (ref 30.0–36.0)
MCV: 96.5 fL (ref 80.0–100.0)
MONO ABS: 0.6 10*3/uL (ref 0.1–1.0)
Monocytes Relative: 8 %
NRBC: 0 % (ref 0.0–0.2)
Neutro Abs: 4.9 10*3/uL (ref 1.7–7.7)
Neutrophils Relative %: 75 %
Platelets: 247 10*3/uL (ref 150–400)
RBC: 3.4 MIL/uL — ABNORMAL LOW (ref 4.22–5.81)
RDW: 15.2 % (ref 11.5–15.5)
WBC: 6.6 10*3/uL (ref 4.0–10.5)

## 2018-09-08 LAB — I-STAT TROPONIN, ED
TROPONIN I, POC: 0.02 ng/mL (ref 0.00–0.08)
Troponin i, poc: 0.03 ng/mL (ref 0.00–0.08)
Troponin i, poc: 0.03 ng/mL (ref 0.00–0.08)

## 2018-09-08 LAB — BRAIN NATRIURETIC PEPTIDE: B NATRIURETIC PEPTIDE 5: 627.6 pg/mL — AB (ref 0.0–100.0)

## 2018-09-08 MED ORDER — ACETAMINOPHEN 325 MG PO TABS: 650.0000 mg | ORAL_TABLET | Freq: Four times a day (QID) | ORAL | Status: DC | PRN

## 2018-09-08 MED ORDER — TAMSULOSIN HCL 0.4 MG PO CAPS
0.4000 mg | ORAL_CAPSULE | Freq: Every day | ORAL | Status: DC
  Administered 2018-09-08 – 2018-09-09 (×2): 0.4 mg via ORAL
  Filled 2018-09-08 (×2): qty 1

## 2018-09-08 MED ORDER — ALLOPURINOL 100 MG PO TABS
200.0000 mg | ORAL_TABLET | Freq: Every day | ORAL | Status: DC
  Administered 2018-09-09 – 2018-09-10 (×2): 200 mg via ORAL
  Filled 2018-09-08 (×2): qty 2

## 2018-09-08 MED ORDER — FUROSEMIDE 10 MG/ML IJ SOLN
40.0000 mg | Freq: Once | INTRAMUSCULAR | Status: AC
  Administered 2018-09-08: 40 mg via INTRAVENOUS
  Filled 2018-09-08: qty 4

## 2018-09-08 MED ORDER — AMIODARONE HCL 200 MG PO TABS
200.0000 mg | ORAL_TABLET | Freq: Two times a day (BID) | ORAL | Status: DC
  Administered 2018-09-08 – 2018-09-10 (×4): 200 mg via ORAL
  Filled 2018-09-08 (×4): qty 1

## 2018-09-08 MED ORDER — CLOPIDOGREL BISULFATE 75 MG PO TABS
75.0000 mg | ORAL_TABLET | Freq: Every day | ORAL | Status: DC
  Administered 2018-09-09 – 2018-09-10 (×2): 75 mg via ORAL
  Filled 2018-09-08 (×2): qty 1

## 2018-09-08 MED ORDER — FLUTICASONE FUROATE-VILANTEROL 100-25 MCG/INH IN AEPB
1.0000 | INHALATION_SPRAY | Freq: Every day | RESPIRATORY_TRACT | Status: DC
  Filled 2018-09-08 (×2): qty 28

## 2018-09-08 MED ORDER — ASPIRIN EC 81 MG PO TBEC
81.0000 mg | DELAYED_RELEASE_TABLET | Freq: Every day | ORAL | Status: DC
  Administered 2018-09-09 – 2018-09-10 (×2): 81 mg via ORAL
  Filled 2018-09-08 (×2): qty 1

## 2018-09-08 MED ORDER — BENZONATATE 100 MG PO CAPS
100.0000 mg | ORAL_CAPSULE | Freq: Three times a day (TID) | ORAL | Status: DC | PRN
  Administered 2018-09-08: 100 mg via ORAL
  Filled 2018-09-08: qty 1

## 2018-09-08 MED ORDER — DOCUSATE SODIUM 100 MG PO CAPS
100.0000 mg | ORAL_CAPSULE | Freq: Two times a day (BID) | ORAL | Status: DC
  Administered 2018-09-08 – 2018-09-10 (×4): 100 mg via ORAL
  Filled 2018-09-08 (×4): qty 1

## 2018-09-08 MED ORDER — ISOSORBIDE DINITRATE 20 MG PO TABS
20.0000 mg | ORAL_TABLET | Freq: Three times a day (TID) | ORAL | Status: DC
  Administered 2018-09-09 – 2018-09-10 (×3): 20 mg via ORAL
  Filled 2018-09-08 (×5): qty 1

## 2018-09-08 MED ORDER — APIXABAN 2.5 MG PO TABS
2.5000 mg | ORAL_TABLET | Freq: Two times a day (BID) | ORAL | Status: DC
  Administered 2018-09-08 – 2018-09-10 (×4): 2.5 mg via ORAL
  Filled 2018-09-08 (×4): qty 1

## 2018-09-08 MED ORDER — ACETAMINOPHEN 650 MG RE SUPP: 650.0000 mg | Freq: Four times a day (QID) | RECTAL | Status: DC | PRN

## 2018-09-08 MED ORDER — CARVEDILOL 12.5 MG PO TABS
6.2500 mg | ORAL_TABLET | Freq: Two times a day (BID) | ORAL | Status: DC
  Administered 2018-09-09 – 2018-09-10 (×3): 6.25 mg via ORAL
  Filled 2018-09-08 (×3): qty 1

## 2018-09-08 MED ORDER — IPRATROPIUM-ALBUTEROL 0.5-2.5 (3) MG/3ML IN SOLN: 3.0000 mL | RESPIRATORY_TRACT | Status: DC | PRN

## 2018-09-08 MED ORDER — HYDRALAZINE HCL 10 MG PO TABS: 10.0000 mg | ORAL_TABLET | Freq: Three times a day (TID) | ORAL | Status: DC

## 2018-09-08 MED ORDER — HYDROCOD POLST-CPM POLST ER 10-8 MG/5ML PO SUER
5.0000 mL | Freq: Two times a day (BID) | ORAL | Status: DC | PRN
  Administered 2018-09-09 (×2): 5 mL via ORAL
  Filled 2018-09-08 (×3): qty 5

## 2018-09-08 MED ORDER — ROSUVASTATIN CALCIUM 20 MG PO TABS
20.0000 mg | ORAL_TABLET | Freq: Every day | ORAL | Status: DC
  Administered 2018-09-09: 20 mg via ORAL
  Filled 2018-09-08: qty 1

## 2018-09-08 MED ORDER — HYDROCODONE-ACETAMINOPHEN 5-325 MG PO TABS
1.0000 | ORAL_TABLET | Freq: Two times a day (BID) | ORAL | Status: DC | PRN
  Administered 2018-09-09: 1 via ORAL
  Filled 2018-09-08: qty 1

## 2018-09-08 MED ORDER — POLYETHYLENE GLYCOL 3350 17 G PO PACK
17.0000 g | PACK | Freq: Every day | ORAL | Status: DC | PRN
  Administered 2018-09-10: 17 g via ORAL
  Filled 2018-09-08: qty 1

## 2018-09-08 NOTE — ED Notes (Signed)
Admitting doctor at the bedside 

## 2018-09-08 NOTE — ED Notes (Signed)
Report given to [facility] on 5c

## 2018-09-08 NOTE — ED Notes (Signed)
Patient verbalizes understanding of discharge instructions. Opportunity for questioning and answers were provided. Armband removed by staff, pt discharged from ED via wheelchair with wife.  

## 2018-09-08 NOTE — ED Triage Notes (Signed)
  Patient BIB [facility] for weakness that has been getting progressively worse for the last few weeks.  Patient had a stent placed around a month ago and was diagnosed with a URI.  Patient has had a productive cough for the last few weeks and has been taking hydrocodone syrup for the cough.  Patient felt more weak and SOB tonight and called EMS.  Patient has hx of CHF and MI.  Patient is A&O x4.  Pain 5/10.

## 2018-09-08 NOTE — H&P (Addendum)
Date: 09/08/2018               Patient Name:  Ryan Macdonald MRN: [id number]  DOB: [birth year] Age / Sex: 84 y.o., male   PCP: [facility]         Medical Service: Internal Medicine Teaching Service         Attending Physician: Dr. Carlynn Purl    First Contact: Dr. Teola Bradley Pager: [phone]  Second Contact: Dr. Levora Dredge Pager: [phone]       After Hours (After 5p/  First Contact Pager: [phone]  weekends / holidays): Second Contact Pager: [phone]   Chief Complaint: shortness of breath  History of Present Illness: [name] is a 84 yo M w/ PMH of HFrEF (LVEF 20-25% 04/2018) s/p pacemaker, Paroxysmal A.fib on Eliquis, T2DM, HTN, HLD who presents with shortness of breath. He was discharged from [facility] yesterday morning after being found to be fluid overloaded and receiving 1 dose of IV Lasix 40mg  in the ED. When he returned home, he was feeling fine and he took all of his home medications as prescribed. In the early afternoon he began to feel that 'something was off.' He states that he started to experience dyspnea but he has a pulse ox at home and was saturating WNL on RA. He took his anxiety medication to see if he was having a panic attack but he did not have any significant improvement. He also used his nebulizer machine but had no improvement as well so he returned to the ED. He states his usual dry weight is 248 lbs and he weighed himself today and found his weigh to be 251lbs. He states he keeps his diet low in sodium (last meal was unspecified noodles) and ensure his fluid intake is limited. He states that he took all of his medications including his torsemide 20mg . He denies any F/N/V/DC. Denies any chest pain, palpitations, headache, dizziness, cough, significant worsening of edema.. Denies any dysuria, incontinence, frequency.  Of note, he was admitted in from 9/30 - 10/3 for CHF exacerbation with malaise, edema, and cough. He was diuresed  with Lasix 40mg  IV BID and discharged once back to dry weight with close follow up with cardiology.  He also had a cath on 08/18/2018 and underwent a left heart cath that showed 75% stenosis of proximal LAD and was treated with DES. He was instructed to continue aspirin for 1 month (end date 09/18/18) and clopidogrel for 6 months (end date 02/17/19) and continue his eliquis  In the ED, he was found to have his weight increased back up to 253lb. No acute pulmonary edema on Chest X-ray. Internal medicine was consulted for admission for CHF exacerbation.  Meds:  Allopurinol 200mg  daily Amiodraone 200mg  BID Eliquis 2.5mg  BID Aspirin 81mg  daily Carvedilol 6.25mg  BID  Clopidogrel 75mg  daily Cyanocobalamin daily Voltaren gel 1% Colace 100mg  BID Finasteride 5mg  daily Breo Ellipta 1 puff daily Glipizide 5mg  daily Hydralazine 10mg  q8hrs Norco/Vicodin 10-650 q4hr PRN Duoneb q4hr PRN Isosorbide dinitrate 20mg  BID Magnesium oxide 500mg  daily K-dur daily Rosuvastatin 20mg  daily Sertraline 25mg  daily Tamsulosin 0.4mg  qhs Torsemide 20mg  daily  Allergies: Allergies as of 09/08/2018 - Review Complete 09/08/2018  Allergen Reaction Noted  . Lisinopril Swelling 10/11/2013  . Xarelto [rivaroxaban] Other (See Comments) 02/13/2018   Past Medical History:  Diagnosis Date  . Atrial fibrillation (HCC)   . Cardiomyopathy (HCC)   . Cat scratch fever    removed mass  on right side neck  . Cataracts, bilateral   . CHF (congestive heart failure) (HCC)   . Chronic kidney disease   . Corns and callosities   . Diabetes mellitus without complication (HCC)    TYPE 2  . Dyspnea   . Erythema intertrigo   . Flat foot   . Gout   . High cholesterol   . Hx of urethral stricture   . Hypertension   . Kidney stones   . Morbid obesity (HCC)   . Nonischemic cardiomyopathy (HCC)    a. ? 2009 Cath in [place] - nl cors per pt;  b. 09/2013 Echo: EF 25-30%, sev diff HK.  . Obesity   .  Osteoarthritis   . PAF (paroxysmal atrial fibrillation) (HCC)    a. post-op hip in 2014 - prev on xarelto.  . Renal insufficiency     Family History:  Family History  Problem Relation Age of Onset  . Heart disease Mother   . Heart Problems Maternal Grandmother   . Other Unknown        negative for premature CAD    Social History:  Lives at home with his wife. Currently retired. Used to work as Emergency planning/management officer. Denies any EtOH, tobacco, IVDU.  Review of Systems: A complete ROS was negative except as per HPI.   Physical Exam: Blood pressure 98/65, pulse 70, temperature 97.6 F (36.4 C), temperature source Oral, resp. rate 16, weight 115 kg, SpO2 99 %. Physical Exam  Constitutional: He is oriented to person, place, and time and well-developed, well-nourished, and in no distress. No distress.  Morbidly obese  HENT:  Head: Normocephalic and atraumatic.  Mouth/Throat: Oropharynx is clear and moist. No oropharyngeal exudate.  Eyes: Pupils are equal, round, and reactive to light. Conjunctivae and EOM are normal. No scleral icterus.  Neck: Normal range of motion. Neck supple. JVD present.  Cardiovascular: Normal rate, regular rhythm, normal heart sounds and intact distal pulses.  Pulmonary/Chest: Effort normal. He has no wheezes. He has rales (faint bibasilar rales).  Decreased breath sounds  Abdominal: Soft. Bowel sounds are normal. He exhibits distension. There is no tenderness. There is no guarding.  Musculoskeletal: Normal range of motion. He exhibits edema (2+ pitting edema up to 2/3 upper shin).  Neurological: He is alert and oriented to person, place, and time. No cranial nerve deficit. GCS score is 15.  Skin: Skin is warm and dry. He is not diaphoretic.  Psychiatric: Mood, memory, affect and judgment normal.   EKG: personally reviewed my interpretation is pacemaker rhythm, left axis deviation, ?significant artifacts, wide QRS.  CXR: none from this admission. ED visit  yesterday X-ray was performed: poor penetration, enlarged cardiac silhouette, no lobar consolidation, no obvious pleural effusion. +pulmonary edema. Charyl Dancer B lines  Assessment & Plan by Problem: Mr.Plude is a 84 yo M w/ complicated cardiac history presenting with dyspnea. Increased weight and edema indicative of acute on chronic systolic heart failure. He has had recent admissions for similar events in the past. Part of his dyspnea may be complicated by COPD as he does not appear to be using his Breo daily as instructed. However denies any improvement with Duonebs. Will admit to observation for IV diuretic therapy.  Acute on Chronic Systolic&Diastolic Heart Failure BNP 627.6,  hypervolemic on exam. Weight (253.5lb) increased from baseline (248lb) Known hx of recurrent CHF exacerbations. Last Echo in 04/2018 show EF of 20-25% & grade 2 diastolic dysfunction. 1 time IV Lasix 40mg  received during ED visit  yesterday. Creatinine 1.59 at baseline (1.6) - Start IV Furosemide 40mg  BID - Trend BMP - Mag level - Strict I&Os - Daily Weights - Fluid restriction - Keep O2 sat >88 - Replenish K as needed >4.0 - C/w home meds: amiodarone 200mg  BID, carvedilol 6.25mg  BID, hydralazine 10mg  q8hr, isosorbide dinitrate 20mg  q8hrs, rosuvastatin 20mg  daily  CAD s/p DES I-stat trop negative, No ST elevation on EKG, no complaints of chest pain. LAD stent placed on 08/18/18 - No need to trend trops - C/w home meds: Eliquis 2.5mg  bid, asa 81mg  daily, clopidogrel 75mg  daily  COPD Satting 95 on room air. Distant breath sounds on exam - c/w home meds: Breo Ellipta 1 puff daily, Duoneb 3mL q4hr PRN, Tessalon 100mg  TID PRN  Bacteuria  UA: + nitrite, large leukocytes, +WBC. UTI during hospitalization in 9/20 s/p ciprofloxacin for 2 weeks. Urine culture collected during ED visit yesterday. No urinary sxs at this time. - No abx treatment at this time - F/u urine culture  T2DM Last a1c on 08/14/18 6.0 BG on admit  100 - C/w home med: Novolog 3units TID qc - SSI - Glucose checks  Chronic Back pain - C/w home meds: Hydrocodone-acetaminophen 5-325mg  BID PRN  Hx of Gout - C/w home med: allopurinol 200mg  daily  Hx of BPH - c/w home med: tamsulosin 0.4mg  daily  DVT prophx: Eliquis Diet: Cardiac/diabetic w/ fluid restriction Bowel: Miralax Code: Full  Dispo: Admit patient to Observation with expected length of stay less than 2 midnights.  Signed: Theotis Barrio, MD 09/08/2018, 8:47 PM  Pager: [phone]

## 2018-09-08 NOTE — Plan of Care (Signed)
Pt admitted and oriented to room/unit. Pt in NAD and progressing positively towards discharge.

## 2018-09-08 NOTE — ED Notes (Signed)
Patient transported to X-ray 

## 2018-09-08 NOTE — ED Triage Notes (Signed)
THE PT IS C/O SOB  HE JUST LEFT THIS HOSPITAL YESTERDAY FOR THE SAME HE STARTED HAVING SOB AROUND 1530  WHEN EMS ARRIVED TO HIS HOUSE HIS P0 WAS 90% ON ROOM AIR  ON 2 LITERS OF NASAL 02 HIS SATS ARE 96-99 ON 3 LITERS.  AT PRESENT THE PT IS NOT SOB  HE REPORTS THAT HE HAS ANXIETY AND HE TOOK HIS MEDS

## 2018-09-08 NOTE — ED Provider Notes (Signed)
[name] EMERGENCY DEPARTMENT Provider Note   CSN: [id number] Arrival date & time: 09/08/18  0205     History   Chief Complaint Chief Complaint  Patient presents with  . Weakness  . Shortness of Breath    HPI Ryan Macdonald is a 84 y.o. male.  Patient presents for evaluation of generalized weakness.  Patient reports that he has had the symptoms ongoing for a couple of days.  He was recently hospitalized for congestive heart failure and had a stent placed, a little more than a month ago.  Around that time he developed a cough and chest congestion and reports that this has been persistent since then.  He has felt some increased shortness of breath.  He is not experiencing any chest pain.     Past Medical History:  Diagnosis Date  . Atrial fibrillation (HCC)   . Cardiomyopathy (HCC)   . Cat scratch fever    removed mass on right side neck  . Cataracts, bilateral   . CHF (congestive heart failure) (HCC)   . Chronic kidney disease   . Corns and callosities   . Diabetes mellitus without complication (HCC)    TYPE 2  . Dyspnea   . Erythema intertrigo   . Flat foot   . Gout   . High cholesterol   . Hx of urethral stricture   . Hypertension   . Kidney stones   . Morbid obesity (HCC)   . Nonischemic cardiomyopathy (HCC)    a. ? 2009 Cath in [place] - nl cors per pt;  b. 09/2013 Echo: EF 25-30%, sev diff HK.  . Obesity   . Osteoarthritis   . PAF (paroxysmal atrial fibrillation) (HCC)    a. post-op hip in 2014 - prev on xarelto.  . Renal insufficiency     Patient Active Problem List   Diagnosis Date Noted  . URI (upper respiratory infection) 08/31/2018  . UTI (urinary tract infection) 08/31/2018  . Acute on chronic systolic (congestive) heart failure (HCC) 08/28/2018  . CHF (congestive heart failure) (HCC) 08/14/2018  . Acute on chronic combined systolic and diastolic congestive heart failure (HCC) 07/18/2018  . First degree AV block   . Shortness  of breath 04/29/2018  . Acute HFrEF (heart failure with reduced ejection fraction) (HCC) 04/29/2018  . Acute respiratory failure with hypoxia (HCC) 09/27/2017  . CHF exacerbation (HCC) 09/27/2017  . Renal insufficiency   . Acute on chronic systolic CHF (congestive heart failure) (HCC) 07/08/2017  . Left knee pain 07/08/2017  . Acute bronchitis 05/10/2017  . Acute congestive heart failure (HCC) 04/14/2017  . Elevated troponin 04/14/2017  . Lactic acidosis   . Acute on chronic systolic heart failure, NYHA class 2 (HCC) 02/03/2017  . Diabetes mellitus type 2 in obese (HCC) 02/03/2017  . CAP (community acquired pneumonia) 02/03/2017  . Lobar pneumonia (HCC)   . Diabetes mellitus with complication (HCC)   . Acute on chronic respiratory failure with hypoxia (HCC)   . Acute on chronic combined systolic and diastolic heart failure (HCC) 05/19/2016  . Depression 05/19/2016  . Gout 05/19/2016  . 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 NSVT (nonsustained ventricular tachycardia) (HCC)   . Chronic renal insufficiency, stage 3 (moderate) (HCC)   . Pulmonary embolism (HCC)   . Acute respiratory failure (HCC)   . Osteoarthritis of left hip 10/25/2013  . Atrial fibrillation (HCC) 10/25/2013  . S/P hip replacement 10/24/2013  . Nonischemic cardiomyopathy (HCC)   . Obesity (  BMI 30-39.9)   . Essential hypertension   . Hyperlipidemia     Past Surgical History:  Procedure Laterality Date  . CARDIAC CATHETERIZATION  1980's  . CIRCUMCISION  1940's  . CORONARY STENT INTERVENTION N/A 08/18/2018   Procedure: CORONARY STENT INTERVENTION;  Surgeon: Corky Crafts, MD;  Location: [facility] INVASIVE CV LAB;  Service: Cardiovascular;  Laterality: N/A;  . CYSTOSCOPY WITH URETHRAL DILATATION  10/23/2013  . CYSTOSCOPY WITH URETHRAL DILATATION N/A 10/23/2013   Procedure: CYSTOSCOPY WITH URETHRAL DILATATION;  Surgeon: Kathi Ludwig, MD;  Location: [facility] OR;  Service: Urology;   Laterality: N/A;  . INCISION AND DRAINAGE OF WOUND Right 1980's   "cat scratch" (10/23/2013)  . INGUINAL HERNIA REPAIR Right 1950's  . INTRAVASCULAR ULTRASOUND/IVUS N/A 08/18/2018   Procedure: Intravascular Ultrasound/IVUS;  Surgeon: Corky Crafts, MD;  Location: [facility] INVASIVE CV LAB;  Service: Cardiovascular;  Laterality: N/A;  . KIDNEY STONE SURGERY Right 1970's; 1983   "twice"  . LEFT HEART CATH AND CORONARY ANGIOGRAPHY N/A 08/18/2018   Procedure: LEFT HEART CATH AND CORONARY ANGIOGRAPHY;  Surgeon: Corky Crafts, MD;  Location: [facility] INVASIVE CV LAB;  Service: Cardiovascular;  Laterality: N/A;  . TONSILLECTOMY AND ADENOIDECTOMY  1940's  . TOTAL HIP ARTHROPLASTY Left 10/23/2013   Procedure: TOTAL HIP ARTHROPLASTY;  Surgeon: Valeria Batman, MD;  Location: [facility] OR;  Service: Orthopedics;  Laterality: Left;        Home Medications    Prior to Admission medications   Medication Sig Start Date End Date Taking? Authorizing Provider  allopurinol (ZYLOPRIM) 100 MG tablet Take 200 mg by mouth daily. To prevent gout    [provider]  amiodarone (PACERONE) 200 MG tablet Take 1 tablet (200 mg total) by mouth 2 (two) times daily. Patient taking differently: Take 200 mg by mouth 2 (two) times daily. For heart rhythm control 02/16/18   Burna Cash, MD  apixaban (ELIQUIS) 2.5 MG TABS tablet Take 1 tablet (2.5 mg total) by mouth 2 (two) times daily. Patient taking differently: Take 2.5 mg by mouth 2 (two) times daily. Caution - blood thinner 05/04/18   Angelita Ingles, MD  aspirin EC 81 MG EC tablet Take 1 tablet (81 mg total) by mouth daily. 08/20/18   Arty Baumgartner, NP  benzonatate (TESSALON) 100 MG capsule Take 1 capsule (100 mg total) by mouth 3 (three) times daily as needed for cough. 09/06/18   Jodelle Gross, NP  carvedilol (COREG) 12.5 MG tablet Take 0.5 tablets (6.25 mg total) by mouth 2 (two) times daily with a meal. Patient taking differently: Take 12.5  mg by mouth 2 (two) times daily with a meal.  04/15/17   Osvaldo Shipper, MD  chlorpheniramine-HYDROcodone [facility] PENNKINETIC ER) 10-8 MG/5ML SUER Take 5 mLs by mouth every 12 (twelve) hours as needed for cough. 09/06/18   Jodelle Gross, NP  ciprofloxacin (CILOXAN) 0.3 % ophthalmic solution Place 1 drop into the left eye 4 (four) times daily. Antibiotic eye drops    [provider]  clopidogrel (PLAVIX) 75 MG tablet Take 1 tablet (75 mg total) by mouth daily with breakfast. 08/20/18   Laverda Page B, NP  cyanocobalamin 1000 MCG tablet Take 1,000 mcg by mouth daily.     [provider]  diclofenac sodium (VOLTAREN) 1 % GEL Apply 4 g topically 4 (four) times daily. Patient taking differently: Apply 4 g topically 4 (four) times daily as needed (pain).  07/10/17   Valentino Nose, MD  docusate sodium (  COLACE) 100 MG capsule Take 1 capsule (100 mg total) by mouth 2 (two) times daily as needed for mild constipation. Patient taking differently: Take 100 mg by mouth daily.  07/20/18   Barrett, Joline Salt, PA-C  finasteride (PROSCAR) 5 MG tablet Take 5 mg by mouth daily.     [provider]  fluticasone furoate-vilanterol (BREO ELLIPTA) 100-25 MCG/INH AEPB Inhale 1 puff into the lungs daily. 05/05/18   Angelita Ingles, MD  glipiZIDE (GLUCOTROL) 5 MG tablet Take 5 mg by mouth daily before breakfast.    [provider]  hydrALAZINE (APRESOLINE) 10 MG tablet Take 1 tablet (10 mg total) by mouth every 8 (eight) hours. Patient taking differently: Take 10 mg by mouth every 8 (eight) hours. For blood pressure 08/19/18   Laverda Page B, NP  HYDROcodone-acetaminophen (NORCO/VICODIN) 5-325 MG tablet Take 2 tablets by mouth every 4 (four) hours as needed. Patient taking differently: Take 1 tablet by mouth 2 (two) times daily as needed for moderate pain.  04/09/16   Isa Rankin, MD  ipratropium-albuterol (DUONEB) 0.5-2.5 (3) MG/3ML SOLN Take 3 mLs by nebulization every 4  (four) hours as needed. Patient taking differently: Take 3 mLs by nebulization every 4 (four) hours as needed (shortness of breath).  05/14/17   Pearson Grippe, MD  isosorbide dinitrate (ISORDIL) 20 MG tablet Take 20 mg by mouth 3 (three) times daily.    [provider]  ketoconazole (NIZORAL) 2 % shampoo Apply 1 application topically 2 (two) times a week.    [provider]  magnesium oxide (MAG-OX) 400 MG tablet Take 400 mg by mouth daily.    [provider]  nitroGLYCERIN (NITROSTAT) 0.4 MG SL tablet Place 1 tablet (0.4 mg total) under the tongue every 5 (five) minutes x 3 doses as needed for chest pain. 08/19/18   Arty Baumgartner, NP  potassium chloride SA (K-DUR,KLOR-CON) 20 MEQ tablet Take 20 mEq by mouth daily.    [provider]  prednisoLONE acetate (PRED FORTE) 1 % ophthalmic suspension Place 1 drop into the left eye 4 (four) times daily.    [provider]  PRESCRIPTION MEDICATION Apply 1 application topically daily as needed (pain). ThermaGel    [provider]  rosuvastatin (CRESTOR) 20 MG tablet Take 1 tablet (20 mg total) by mouth daily at 6 PM. 05/04/18   Angelita Ingles, MD  sertraline (ZOLOFT) 25 MG tablet Take 25-50 mg by mouth daily as needed (for anxiety).     [provider]  tamsulosin (FLOMAX) 0.4 MG CAPS capsule Take 0.4 mg by mouth at bedtime.    [provider]  torsemide (DEMADEX) 20 MG tablet Take one 20mg  tablet daily.  However, if weight up 3lbs in 24 hours take an extra dose that day.  If weight is below your dry weight skip your dose that day. Patient taking differently: Take 20 mg by mouth See admin instructions. Take one 20mg  tablet daily.  However, if weight up 3lbs in 24 hours take an extra dose that day.  If weight is below your dry weight skip your dose that day. 07/20/18   Barrett, Joline Salt, PA-C    Family History Family History  Problem Relation Age of Onset  . Heart disease Mother   .  Heart Problems Maternal Grandmother   . Other Unknown        negative for premature CAD    Social History Social History   Tobacco Use  . Smoking status:  Former Smoker    Packs/day: 1.00    Years: 15.00    Pack years: 15.00    Types: Cigarettes, Cigars  . Smokeless tobacco: Former Neurosurgeon  . Tobacco comment: quit smoking ~ 50 yr ago  Substance Use Topics  . Alcohol use: No    Alcohol/week: 2.0 standard drinks    Types: 2 Cans of beer per week    Comment: rare beer.  . Drug use: No     Allergies   Lisinopril and Xarelto [rivaroxaban]   Review of Systems Review of Systems  Constitutional: Positive for fatigue.  Respiratory: Positive for cough and shortness of breath.   All other systems reviewed and are negative.    Physical Exam Updated Vital Signs BP 126/83   Pulse 67   Temp 98.1 F (36.7 C) (Oral)   Resp (!) 24   Ht 5\' 7"  (1.702 m)   Wt 115 kg   SpO2 95%   BMI 39.71 kg/m   Physical Exam  Constitutional: He is oriented to person, place, and time. He appears well-developed and well-nourished. No distress.  HENT:  Head: Normocephalic and atraumatic.  Right Ear: Hearing normal.  Left Ear: Hearing normal.  Nose: Nose normal.  Mouth/Throat: Oropharynx is clear and moist and mucous membranes are normal.  Eyes: Pupils are equal, round, and reactive to light. Conjunctivae and EOM are normal.  Neck: Normal range of motion. Neck supple.  Cardiovascular: Regular rhythm, S1 normal and S2 normal. Exam reveals no gallop and no friction rub.  No murmur heard. Pulmonary/Chest: Effort normal and breath sounds normal. No respiratory distress. He exhibits no tenderness.  Abdominal: Soft. Normal appearance and bowel sounds are normal. There is no hepatosplenomegaly. There is no tenderness. There is no rebound, no guarding, no tenderness at McBurney's point and negative Murphy's sign. No hernia.  Musculoskeletal: Normal range of motion.       Right lower leg: He exhibits  edema.       Left lower leg: He exhibits edema.  Neurological: He is alert and oriented to person, place, and time. He has normal strength. No cranial nerve deficit or sensory deficit. Coordination normal. GCS eye subscore is 4. GCS verbal subscore is 5. GCS motor subscore is 6.  Skin: Skin is warm, dry and intact. No rash noted. No cyanosis.  Psychiatric: He has a normal mood and affect. His speech is normal and behavior is normal. Thought content normal.  Nursing note and vitals reviewed.    ED Treatments / Results  Labs (all labs ordered are listed, but only abnormal results are displayed) Labs Reviewed  BASIC METABOLIC PANEL - Abnormal; Notable for the following components:      Result Value   Glucose, Bld 170 (*)    BUN 31 (*)    Creatinine, Ser 1.77 (*)    Calcium 8.6 (*)    GFR calc non Af Amer 34 (*)    GFR calc Af Amer 39 (*)    All other components within normal limits  CBC - Abnormal; Notable for the following components:   RBC 3.60 (*)    Hemoglobin 10.7 (*)    HCT 35.3 (*)    All other components within normal limits  BRAIN NATRIURETIC PEPTIDE - Abnormal; Notable for the following components:   B Natriuretic Peptide 627.6 (*)    All other components within normal limits  URINE CULTURE  URINALYSIS, ROUTINE W REFLEX MICROSCOPIC  CBG MONITORING, ED  I-STAT TROPONIN, ED    EKG  EKG Interpretation  Date/Time:  Friday September 08 2018 02:27:12 EDT Ventricular Rate:  70 PR Interval:    QRS Duration: 194 QT Interval:  502 QTC Calculation: 542 R Axis:   -51 Text Interpretation:  Ventricular-paced rhythm with occasional supraventricular complexes Abnormal ECG Confirmed by Gilda Crease [phone]) on 09/08/2018 2:59:28 AM   Radiology Dg Chest 2 View  Result Date: 09/08/2018 CLINICAL DATA:  Progressive weakness over the last few weeks. Stent placement 1 month ago. Upper respiratory infection. Productive cough. Shortness of breath tonight. EXAM: CHEST - 2 VIEW  COMPARISON:  08/28/2018 FINDINGS: Shallow inspiration. Cardiac pacemaker. Cardiac enlargement without vascular congestion, edema, or consolidation. No blunting of costophrenic angles. No pneumothorax. Mediastinal contours appear intact. Calcification of the aorta. IMPRESSION: Cardiac enlargement. No evidence of active pulmonary disease. Electronically Signed   By: Burman Nieves M.D.   On: 09/08/2018 03:27    Procedures Procedures (including critical care time)  Medications Ordered in ED Medications  furosemide (LASIX) injection 40 mg (40 mg Intravenous Given 09/08/18 0530)     Initial Impression / Assessment and Plan / ED Course  I have reviewed the triage vital signs and the nursing notes.  Pertinent labs & imaging results that were available during my care of the patient were reviewed by me and considered in my medical decision making (see chart for details).     Patient presents to the emergency department for evaluation of generalized weakness and shortness of breath.  Generalized weakness has occurred over the last couple of days.  He reports that for at least a month he has had cough, chest congestion.  He was placed on antibiotics for upper respiratory infection and urinary tract infection 4 weeks ago.  His cough never improved.  He has moderate swelling of both lower extremities which he feels is at his baseline.  Patient does have a history of congestive heart failure, had recent hospitalization for diuresis.  His work-up today is looks like everything is at about his normal baseline.  He has been ambulatory here in the ER.  He is not hypoxic off oxygen with exertion.  He likely has somewhat mild volume overload.  Patient administered IV Lasix here.  He does not wish to be admitted.  As he is not hypoxic, not having chest pain, it is reasonable for him to diurese here in the ER and then increase his diuresis at home for 3 days, follow-up with his primary doctor.  Final Clinical  Impressions(s) / ED Diagnoses   Final diagnoses:  Acute on chronic congestive heart failure, unspecified heart failure type [facility])    ED Discharge Orders    None       Gilda Crease, MD 09/08/18 [phone]

## 2018-09-08 NOTE — ED Provider Notes (Signed)
[name] EMERGENCY DEPARTMENT Provider Note   CSN: [id number] Arrival date & time: 09/08/18  1924     History   Chief Complaint Chief Complaint  Patient presents with  . Shortness of Breath    HPI Ryan Macdonald is a 84 y.o. male.  The history is provided by the patient.  Shortness of Breath  This is a recurrent problem. The problem occurs continuously.The current episode started more than 2 days ago. The problem has been gradually worsening. Associated symptoms include orthopnea and leg swelling. Pertinent negatives include no fever, no headaches, no coryza, no rhinorrhea, no sore throat, no swollen glands, no ear pain, no neck pain, no cough, no sputum production, no hemoptysis, no wheezing, no chest pain, no vomiting, no abdominal pain and no rash. It is unknown what precipitated the problem. Treatments tried: lasix  The treatment provided no relief. He has had prior hospitalizations. Associated medical issues include CAD and heart failure.    Past Medical History:  Diagnosis Date  . Atrial fibrillation (HCC)   . Cardiomyopathy (HCC)   . Cat scratch fever    removed mass on right side neck  . Cataracts, bilateral   . CHF (congestive heart failure) (HCC)   . Chronic kidney disease   . Corns and callosities   . Diabetes mellitus without complication (HCC)    TYPE 2  . Dyspnea   . Erythema intertrigo   . Flat foot   . Gout   . High cholesterol   . Hx of urethral stricture   . Hypertension   . Kidney stones   . Morbid obesity (HCC)   . Nonischemic cardiomyopathy (HCC)    a. ? 2009 Cath in [place] - nl cors per pt;  b. 09/2013 Echo: EF 25-30%, sev diff HK.  . Obesity   . Osteoarthritis   . PAF (paroxysmal atrial fibrillation) (HCC)    a. post-op hip in 2014 - prev on xarelto.  . Renal insufficiency     Patient Active Problem List   Diagnosis Date Noted  . Volume overload 09/08/2018  . URI (upper respiratory infection) 08/31/2018  . UTI (urinary  tract infection) 08/31/2018  . Acute on chronic systolic (congestive) heart failure (HCC) 08/28/2018  . CHF (congestive heart failure) (HCC) 08/14/2018  . Acute on chronic combined systolic and diastolic congestive heart failure (HCC) 07/18/2018  . First degree AV block   . Shortness of breath 04/29/2018  . Acute HFrEF (heart failure with reduced ejection fraction) (HCC) 04/29/2018  . Acute respiratory failure with hypoxia (HCC) 09/27/2017  . CHF exacerbation (HCC) 09/27/2017  . Renal insufficiency   . Acute on chronic systolic CHF (congestive heart failure) (HCC) 07/08/2017  . Left knee pain 07/08/2017  . Acute bronchitis 05/10/2017  . Acute congestive heart failure (HCC) 04/14/2017  . Elevated troponin 04/14/2017  . Lactic acidosis   . Acute on chronic systolic heart failure, NYHA class 2 (HCC) 02/03/2017  . Diabetes mellitus type 2 in obese (HCC) 02/03/2017  . CAP (community acquired pneumonia) 02/03/2017  . Lobar pneumonia (HCC)   . Diabetes mellitus with complication (HCC)   . Acute on chronic respiratory failure with hypoxia (HCC)   . Acute on chronic combined systolic and diastolic heart failure (HCC) 05/19/2016  . Depression 05/19/2016  . Gout 05/19/2016  . 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 NSVT (nonsustained ventricular tachycardia) (HCC)   . Chronic renal insufficiency, stage 3 (moderate) (HCC)   . Pulmonary embolism (HCC)   .  Acute respiratory failure (HCC)   . Osteoarthritis of left hip 10/25/2013  . Atrial fibrillation (HCC) 10/25/2013  . S/P hip replacement 10/24/2013  . Nonischemic cardiomyopathy (HCC)   . Obesity (BMI 30-39.9)   . Essential hypertension   . Hyperlipidemia     Past Surgical History:  Procedure Laterality Date  . CARDIAC CATHETERIZATION  1980's  . CIRCUMCISION  1940's  . CORONARY STENT INTERVENTION N/A 08/18/2018   Procedure: CORONARY STENT INTERVENTION;  Surgeon: Corky Crafts, MD;  Location: [facility]  INVASIVE CV LAB;  Service: Cardiovascular;  Laterality: N/A;  . CYSTOSCOPY WITH URETHRAL DILATATION  10/23/2013  . CYSTOSCOPY WITH URETHRAL DILATATION N/A 10/23/2013   Procedure: CYSTOSCOPY WITH URETHRAL DILATATION;  Surgeon: Kathi Ludwig, MD;  Location: [facility] OR;  Service: Urology;  Laterality: N/A;  . INCISION AND DRAINAGE OF WOUND Right 1980's   "cat scratch" (10/23/2013)  . INGUINAL HERNIA REPAIR Right 1950's  . INTRAVASCULAR ULTRASOUND/IVUS N/A 08/18/2018   Procedure: Intravascular Ultrasound/IVUS;  Surgeon: Corky Crafts, MD;  Location: [facility] INVASIVE CV LAB;  Service: Cardiovascular;  Laterality: N/A;  . KIDNEY STONE SURGERY Right 1970's; 1983   "twice"  . LEFT HEART CATH AND CORONARY ANGIOGRAPHY N/A 08/18/2018   Procedure: LEFT HEART CATH AND CORONARY ANGIOGRAPHY;  Surgeon: Corky Crafts, MD;  Location: [facility] INVASIVE CV LAB;  Service: Cardiovascular;  Laterality: N/A;  . TONSILLECTOMY AND ADENOIDECTOMY  1940's  . TOTAL HIP ARTHROPLASTY Left 10/23/2013   Procedure: TOTAL HIP ARTHROPLASTY;  Surgeon: Valeria Batman, MD;  Location: [facility] OR;  Service: Orthopedics;  Laterality: Left;        Home Medications    Prior to Admission medications   Medication Sig Start Date End Date Taking? Authorizing Provider  allopurinol (ZYLOPRIM) 100 MG tablet Take 200 mg by mouth daily. To prevent gout    [provider]  amiodarone (PACERONE) 200 MG tablet Take 1 tablet (200 mg total) by mouth 2 (two) times daily. Patient taking differently: Take 200 mg by mouth 2 (two) times daily. For heart rhythm control 02/16/18   Burna Cash, MD  apixaban (ELIQUIS) 2.5 MG TABS tablet Take 1 tablet (2.5 mg total) by mouth 2 (two) times daily. Patient taking differently: Take 2.5 mg by mouth 2 (two) times daily. Caution - blood thinner 05/04/18   Angelita Ingles, MD  aspirin EC 81 MG EC tablet Take 1 tablet (81 mg total) by mouth daily. 08/20/18   Arty Baumgartner, NP    benzonatate (TESSALON) 100 MG capsule Take 1 capsule (100 mg total) by mouth 3 (three) times daily as needed for cough. 09/06/18   Jodelle Gross, NP  carvedilol (COREG) 12.5 MG tablet Take 0.5 tablets (6.25 mg total) by mouth 2 (two) times daily with a meal. Patient taking differently: Take 12.5 mg by mouth 2 (two) times daily with a meal.  04/15/17   Osvaldo Shipper, MD  chlorpheniramine-HYDROcodone [facility] PENNKINETIC ER) 10-8 MG/5ML SUER Take 5 mLs by mouth every 12 (twelve) hours as needed for cough. 09/06/18   Jodelle Gross, NP  clopidogrel (PLAVIX) 75 MG tablet Take 1 tablet (75 mg total) by mouth daily with breakfast. 08/20/18   Laverda Page B, NP  cyanocobalamin 1000 MCG tablet Take 1,000 mcg by mouth daily.     [provider]  diclofenac sodium (VOLTAREN) 1 % GEL Apply 4 g topically 4 (four) times daily. Patient taking differently: Apply 4 g topically 4 (four) times daily as needed (pain).  07/10/17  Valentino Nose, MD  docusate sodium (COLACE) 100 MG capsule Take 1 capsule (100 mg total) by mouth 2 (two) times daily as needed for mild constipation. Patient taking differently: Take 100 mg by mouth daily.  07/20/18   Barrett, Joline Salt, PA-C  finasteride (PROSCAR) 5 MG tablet Take 5 mg by mouth daily.     [provider]  fluticasone furoate-vilanterol (BREO ELLIPTA) 100-25 MCG/INH AEPB Inhale 1 puff into the lungs daily. Patient not taking: Reported on 09/08/2018 05/05/18   Angelita Ingles, MD  glipiZIDE (GLUCOTROL) 5 MG tablet Take 5 mg by mouth daily before breakfast.    [provider]  hydrALAZINE (APRESOLINE) 10 MG tablet Take 1 tablet (10 mg total) by mouth every 8 (eight) hours. Patient taking differently: Take 10 mg by mouth every 8 (eight) hours. For blood pressure 08/19/18   Laverda Page B, NP  HYDROcodone-acetaminophen (NORCO/VICODIN) 5-325 MG tablet Take 2 tablets by mouth every 4 (four) hours as needed. Patient taking differently:  Take 1 tablet by mouth 2 (two) times daily as needed for moderate pain.  04/09/16   Isa Rankin, MD  ipratropium-albuterol (DUONEB) 0.5-2.5 (3) MG/3ML SOLN Take 3 mLs by nebulization every 4 (four) hours as needed. Patient taking differently: Take 3 mLs by nebulization every 4 (four) hours as needed (shortness of breath).  05/14/17   Pearson Grippe, MD  isosorbide dinitrate (ISORDIL) 20 MG tablet Take 20 mg by mouth 3 (three) times daily.    [provider]  ketoconazole (NIZORAL) 2 % shampoo Apply 1 application topically 2 (two) times a week.    [provider]  nitroGLYCERIN (NITROSTAT) 0.4 MG SL tablet Place 1 tablet (0.4 mg total) under the tongue every 5 (five) minutes x 3 doses as needed for chest pain. 08/19/18   Arty Baumgartner, NP  PRESCRIPTION MEDICATION Apply 1 application topically daily as needed (pain). ThermaGel    [provider]  rosuvastatin (CRESTOR) 20 MG tablet Take 1 tablet (20 mg total) by mouth daily at 6 PM. Patient not taking: Reported on 09/08/2018 05/04/18   Angelita Ingles, MD  sertraline (ZOLOFT) 25 MG tablet Take 25-50 mg by mouth daily as needed (for anxiety).     [provider]  tamsulosin (FLOMAX) 0.4 MG CAPS capsule Take 0.4 mg by mouth at bedtime.    [provider]  torsemide (DEMADEX) 20 MG tablet Take one 20mg  tablet daily.  However, if weight up 3lbs in 24 hours take an extra dose that day.  If weight is below your dry weight skip your dose that day. Patient taking differently: Take 20 mg by mouth See admin instructions. Take one 20mg  tablet daily.  However, if weight up 3lbs in 24 hours take an extra dose that day.  If weight is below your dry weight skip your dose that day. 07/20/18   Barrett, Joline Salt, PA-C    Family History Family History  Problem Relation Age of Onset  . Heart disease Mother   . Heart Problems Maternal Grandmother   . Other Unknown        negative for premature CAD    Social  History Social History   Tobacco Use  . Smoking status: Former Smoker    Packs/day: 1.00    Years: 15.00    Pack years: 15.00    Types: Cigarettes, Cigars  . Smokeless tobacco: Former Neurosurgeon  . Tobacco comment: quit smoking ~ 50 yr ago  Substance Use Topics  . Alcohol  use: No    Alcohol/week: 2.0 standard drinks    Types: 2 Cans of beer per week    Comment: rare beer.  . Drug use: No     Allergies   Lisinopril and Xarelto [rivaroxaban]   Review of Systems Review of Systems  Constitutional: Negative for chills and fever.  HENT: Negative for ear pain, rhinorrhea and sore throat.   Eyes: Negative for pain and visual disturbance.  Respiratory: Positive for shortness of breath. Negative for cough, hemoptysis, sputum production and wheezing.   Cardiovascular: Positive for orthopnea and leg swelling. Negative for chest pain and palpitations.  Gastrointestinal: Negative for abdominal pain and vomiting.  Genitourinary: Negative for dysuria and hematuria.  Musculoskeletal: Negative for arthralgias, back pain and neck pain.  Skin: Negative for color change and rash.  Neurological: Negative for seizures, syncope and headaches.  All other systems reviewed and are negative.    Physical Exam Updated Vital Signs  ED Triage Vitals  Enc Vitals Group     BP 09/08/18 1926 98/65     Pulse Rate 09/08/18 1926 70     Resp 09/08/18 1926 16     Temp 09/08/18 1926 97.6 F (36.4 C)     Temp Source 09/08/18 1926 Oral     SpO2 09/08/18 1926 99 %     Weight 09/08/18 1928 253 lb 8.5 oz (115 kg)     Height --      Head Circumference --      Peak Flow --      Pain Score 09/08/18 1928 0     Pain Loc --      Pain Edu? --      Excl. in GC? --     Physical Exam  Constitutional: He appears well-developed and well-nourished.  HENT:  Head: Normocephalic and atraumatic.  Mouth/Throat: Oropharynx is clear and moist. No oropharyngeal exudate.  Eyes: Pupils are equal, round, and reactive to  light. Conjunctivae and EOM are normal.  Neck: Normal range of motion. Neck supple.  Cardiovascular: Normal rate, regular rhythm, normal heart sounds and intact distal pulses.  No murmur heard. Pulmonary/Chest: No respiratory distress. He has decreased breath sounds. He has no wheezes. He has rales.  Abdominal: Soft. Bowel sounds are normal. There is no tenderness.  Musculoskeletal: Normal range of motion.       Right lower leg: He exhibits edema.       Left lower leg: He exhibits edema.  Neurological: He is alert.  Skin: Skin is warm and dry. Capillary refill takes less than 2 seconds.  Psychiatric: He has a normal mood and affect.  Nursing note and vitals reviewed.    ED Treatments / Results  Labs (all labs ordered are listed, but only abnormal results are displayed) Labs Reviewed  CBC WITH DIFFERENTIAL/PLATELET - Abnormal; Notable for the following components:      Result Value   RBC 3.40 (*)    Hemoglobin 10.3 (*)    HCT 32.8 (*)    All other components within normal limits  BASIC METABOLIC PANEL - Abnormal; Notable for the following components:   Glucose, Bld 100 (*)    BUN 26 (*)    Creatinine, Ser 1.59 (*)    Calcium 8.7 (*)    GFR calc non Af Amer 39 (*)    GFR calc Af Amer 45 (*)    All other components within normal limits  BASIC METABOLIC PANEL - Abnormal; Notable for the following components:   Glucose, Bld  101 (*)    BUN 24 (*)    Creatinine, Ser 1.50 (*)    Calcium 8.6 (*)    GFR calc non Af Amer 42 (*)    GFR calc Af Amer 48 (*)    All other components within normal limits  MAGNESIUM  GLUCOSE, CAPILLARY  I-STAT TROPONIN, ED    EKG EKG Interpretation  Date/Time:  Friday September 08 2018 19:25:04 EDT Ventricular Rate:  70 PR Interval:    QRS Duration: 193 QT Interval:  528 QTC Calculation: 570 R Axis:   -78 Text Interpretation:  Ventricular-paced rhythm No further analysis attempted due to paced rhythm Confirmed by Virgina Norfolk [phone]) on  09/08/2018 8:29:43 PM   Radiology Dg Chest 2 View  Result Date: 09/08/2018 CLINICAL DATA:  Progressive weakness over the last few weeks. Stent placement 1 month ago. Upper respiratory infection. Productive cough. Shortness of breath tonight. EXAM: CHEST - 2 VIEW COMPARISON:  08/28/2018 FINDINGS: Shallow inspiration. Cardiac pacemaker. Cardiac enlargement without vascular congestion, edema, or consolidation. No blunting of costophrenic angles. No pneumothorax. Mediastinal contours appear intact. Calcification of the aorta. IMPRESSION: Cardiac enlargement. No evidence of active pulmonary disease. Electronically Signed   By: Burman Nieves M.D.   On: 09/08/2018 03:27    Procedures Procedures (including critical care time)  Medications Ordered in ED Medications  allopurinol (ZYLOPRIM) tablet 200 mg (has no administration in time range)  aspirin EC tablet 81 mg (has no administration in time range)  HYDROcodone-acetaminophen (NORCO/VICODIN) 5-325 MG per tablet 1 tablet (has no administration in time range)  amiodarone (PACERONE) tablet 200 mg (200 mg Oral Given 09/08/18 2334)  carvedilol (COREG) tablet 6.25 mg (6.25 mg Oral Given 09/09/18 0840)  hydrALAZINE (APRESOLINE) tablet 10 mg (has no administration in time range)  isosorbide dinitrate (ISORDIL) tablet 20 mg (has no administration in time range)  rosuvastatin (CRESTOR) tablet 20 mg (has no administration in time range)  tamsulosin (FLOMAX) capsule 0.4 mg (0.4 mg Oral Given 09/08/18 2334)  apixaban (ELIQUIS) tablet 2.5 mg (2.5 mg Oral Given 09/08/18 2334)  clopidogrel (PLAVIX) tablet 75 mg (75 mg Oral Given 09/09/18 0840)  benzonatate (TESSALON) capsule 100 mg (100 mg Oral Given 09/08/18 2334)  chlorpheniramine-HYDROcodone (TUSSIONEX) 10-8 MG/5ML suspension 5 mL (5 mLs Oral Given 09/09/18 0046)  fluticasone furoate-vilanterol (BREO ELLIPTA) 100-25 MCG/INH 1 puff (1 puff Inhalation Given 09/09/18 0842)  ipratropium-albuterol (DUONEB)  0.5-2.5 (3) MG/3ML nebulizer solution 3 mL (has no administration in time range)  acetaminophen (TYLENOL) tablet 650 mg (has no administration in time range)    Or  acetaminophen (TYLENOL) suppository 650 mg (has no administration in time range)  docusate sodium (COLACE) capsule 100 mg (100 mg Oral Given 09/08/18 2334)  polyethylene glycol (MIRALAX / GLYCOLAX) packet 17 g (has no administration in time range)  furosemide (LASIX) injection 40 mg (40 mg Intravenous Given 09/09/18 0841)  insulin aspart (novoLOG) injection 0-9 Units (0 Units Subcutaneous Not Given 09/09/18 0726)  insulin aspart (novoLOG) injection 3 Units (3 Units Subcutaneous Not Given 09/09/18 0747)     Initial Impression / Assessment and Plan / ED Course  I have reviewed the triage vital signs and the nursing notes.  Pertinent labs & imaging results that were available during my care of the patient were reviewed by me and considered in my medical decision making (see chart for details).     Dejay Kronk is an 84 year old male with history of cardiomyopathy, heart failure, CKD, atrial fibrillation on blood thinners who presents to the  ED with shortness of breath.  Patient with unremarkable vitals.  No fever.  However does need 2 L of oxygen to maintain O2 sats greater than 92%.  Does not typically wear oxygen at baseline.  Patient was here earlier this morning and had work-up that was consistent with some volume overload likely from heart failure exacerbation.  At that time he was given IV Lasix.  Patient states that he wanted to go home this morning and try to increase his home Lasix to see if he would feel improved.  He did not want to be admitted.  He continues to state progressive shortness of breath and volume overload including worsening shortness of breath with exertion, difficulty lying down flat.  Has rales on exam, peripheral edema.  BNP and chest x-ray earlier in the day was consistent with volume overload.  EKG remains  unremarkable with no ischemic changes.  Troponin within normal limits.  Basic labs were reordered the patient to be admitted to medicine service for further diuresis.  No concern for PE given that likely volume overload from heart failure.  He is already anticoagulated.  Hemodynamically stable throughout my care and admitted.  This chart was dictated using voice recognition software.  Despite best efforts to proofread,  errors can occur which can change the documentation meaning.   Final Clinical Impressions(s) / ED Diagnoses   Final diagnoses:  Acute on chronic congestive heart failure, unspecified heart failure type [facility])    ED Discharge Orders    None       Virgina Norfolk, DO 09/09/18 1610

## 2018-09-08 NOTE — Discharge Instructions (Addendum)
Double your torsemide dose for the next 3 days, then go back to your normal dosing.  Return to the ER for chest pain or worsening difficulty breathing.

## 2018-09-09 ENCOUNTER — Encounter (HOSPITAL_COMMUNITY): Payer: Self-pay | Admitting: Emergency Medicine

## 2018-09-09 DIAGNOSIS — Z95810 Presence of automatic (implantable) cardiac defibrillator: Secondary | ICD-10-CM

## 2018-09-09 DIAGNOSIS — I480 Paroxysmal atrial fibrillation: Secondary | ICD-10-CM | POA: Diagnosis not present

## 2018-09-09 DIAGNOSIS — I2510 Atherosclerotic heart disease of native coronary artery without angina pectoris: Secondary | ICD-10-CM | POA: Diagnosis not present

## 2018-09-09 DIAGNOSIS — D649 Anemia, unspecified: Secondary | ICD-10-CM

## 2018-09-09 DIAGNOSIS — I110 Hypertensive heart disease with heart failure: Secondary | ICD-10-CM | POA: Diagnosis not present

## 2018-09-09 DIAGNOSIS — I5043 Acute on chronic combined systolic (congestive) and diastolic (congestive) heart failure: Secondary | ICD-10-CM | POA: Diagnosis not present

## 2018-09-09 LAB — URINE CULTURE: Culture: <10000 — AB

## 2018-09-09 LAB — MAGNESIUM: Magnesium: 1.8 mg/dL (ref 1.7–2.4)

## 2018-09-09 LAB — GLUCOSE, CAPILLARY
GLUCOSE-CAPILLARY: 93 mg/dL (ref 70–99)
GLUCOSE-CAPILLARY: 116 mg/dL — AB (ref 70–99)
Glucose-Capillary: 117 mg/dL — ABNORMAL HIGH (ref 70–99)

## 2018-09-09 LAB — BASIC METABOLIC PANEL
ANION GAP: 9 (ref 5–15)
BUN: 24 mg/dL — ABNORMAL HIGH (ref 8–23)
CO2: 26 mmol/L (ref 22–32)
Calcium: 8.6 mg/dL — ABNORMAL LOW (ref 8.9–10.3)
Chloride: 105 mmol/L (ref 98–111)
Creatinine, Ser: 1.5 mg/dL — ABNORMAL HIGH (ref 0.61–1.24)
GFR calc Af Amer: 48 mL/min — ABNORMAL LOW (ref >60)
GFR, EST NON AFRICAN AMERICAN: 42 mL/min — AB (ref >60)
GLUCOSE: 101 mg/dL — AB (ref 70–99)
POTASSIUM: 4 mmol/L (ref 3.5–5.1)
Sodium: 140 mmol/L (ref 135–145)

## 2018-09-09 MED ORDER — INSULIN ASPART 100 UNIT/ML ~~LOC~~ SOLN
3.0000 [IU] | Freq: Three times a day (TID) | SUBCUTANEOUS | Status: DC
  Administered 2018-09-10 (×2): 3 [IU] via SUBCUTANEOUS
  Filled 2018-09-09 (×2): qty 1

## 2018-09-09 MED ORDER — FUROSEMIDE 10 MG/ML IJ SOLN
40.0000 mg | Freq: Two times a day (BID) | INTRAMUSCULAR | Status: DC
  Administered 2018-09-09 (×3): 40 mg via INTRAVENOUS
  Filled 2018-09-09 (×3): qty 4

## 2018-09-09 MED ORDER — INSULIN ASPART 100 UNIT/ML ~~LOC~~ SOLN
0.0000 [IU] | Freq: Three times a day (TID) | SUBCUTANEOUS | Status: DC
  Administered 2018-09-10: 1 [IU] via SUBCUTANEOUS
  Administered 2018-09-10: 2 [IU] via SUBCUTANEOUS
  Filled 2018-09-09 (×2): qty 1

## 2018-09-09 MED ORDER — SERTRALINE HCL 50 MG PO TABS
25.0000 mg | ORAL_TABLET | Freq: Every day | ORAL | Status: DC | PRN
  Administered 2018-09-09 – 2018-09-10 (×2): 25 mg via ORAL
  Filled 2018-09-09 (×2): qty 1

## 2018-09-09 NOTE — Progress Notes (Signed)
   Subjective: Ryan Macdonald is doing well today.  He mentions that his shortness of breath is better after Lasix. Denies any chest pain, does not report any overnight events and has no complaints.  Objective:  Vital signs in last 24 hours: Vitals:   09/09/18 0449 09/09/18 0645 09/09/18 0838 09/09/18 1034  BP:  (!) 128/51 (!) 117/58 (!) 94/54  Pulse:  70 69 72  Resp:  17    Temp:  (!) 97.4 F (36.3 C)    TempSrc:  Oral    SpO2:  96% 99%   Weight: 115 kg      Physical Exam  Constitutional: He is oriented to person, place, and time and well-developed, well-nourished, and in no distress. No distress.  HENT:  Head: Normocephalic and atraumatic.  Cardiovascular: Normal rate, regular rhythm and normal heart sounds.  No murmur heard. Pulmonary/Chest: Effort normal. No respiratory distress. Mild bibasilar rales.  Musculoskeletal: He exhibits 1+ bilateral pitting edema.  Neurological: He is alert and oriented to person, place, and time.    Mg: Nl I stat Trop: 0.03, 0.03, 0.03 BNP 627  CBC: No leucocytosis U/A: Bacteria. ---> U/c:<10,000 insignificant growth    Assessment/Plan:  Active Problems:   Volume overload  Ryan Macdonald, is an 84 year old male with past medical  CAD status post stent in LAD 9/19, COPD, A. fib, type 2 diabetes, hypertension presented with SOB, orthopnea.  CHF exacerbation: With history of HFrEF (EF 20-25%) with ICD/pacer. With recent hospitalization for volume overload. SOB, worsening of chronic cough, orthopnea and  3 pounds weight gain in 1 day.on 20 mg torsemide daily at home, not compliant to diet.  BNP 627 (at baseline) has been diuresed with 40 mg Lasix twice daily since admission. Is not significantly volume overload on exam, has Hx of but will target to reach his baseline weight I/O: Net - 650 since admission yesterday.  K: 4 this AM.    -Continue IV lasix 40 mg BID -Continue home dose of Isosorbide dinitrate 20 mg every 8 hours, Coreg 6.25 mg  BID and Hydralazine 10 mg TID -Strict I/O -Weight daily -BMP QD  Afib: Been on Eliquis, amiodarone at home.  EKG shows paced rhythm. -Continue home dose of Amiodarone 200 twice daily, Coreg 6.25 twice daily  CAD s/p LAD stent 9/19: Currently no chest pain.  Shortness of breath seems to be secondary to CHF and not ischemic. Trop trend -. EKG with no acute ST-T changes.  -Continue home dose of aspirin 80 mg daily and Plavix 75 mg daily, -Continue rosuvastatin 20 mg daily  DM: BG this AM: 93 and 101 -Continue sensitive SSI -Continue 3 units NovoLog 3 times daily with meals -Blood glucose  COPD:  Is not on O2 sat at home and now. Nl O2sat at room air.  -Continue home meds  Gout: Stable -C/w home dose of Allopurinol BPH: -C/w home dose of Tamsulosin   Bacteriuria: U/c--> no significant growth (has ,10.000 colonies) -No management required unless symptomatic   Anemia: normocytic.  Hb 10.7 yesterday. has been stable at baseline. Has been on Cyanocobalamin at home. Likely 2/2 chronic disease. -CBC daily and further study as out patient   Diet: Low carb and Cardiac IVF: none (fluid restriction) Code: Full VTE ppx: Eliquies  Dispo: Anticipated discharge in approximately 2-3 days when clinically improved   Ryan Pretty, MD 09/09/2018, 11:08 AM Pager: [phone]

## 2018-09-10 DIAGNOSIS — I2510 Atherosclerotic heart disease of native coronary artery without angina pectoris: Secondary | ICD-10-CM | POA: Diagnosis not present

## 2018-09-10 DIAGNOSIS — I110 Hypertensive heart disease with heart failure: Secondary | ICD-10-CM | POA: Diagnosis not present

## 2018-09-10 DIAGNOSIS — I5043 Acute on chronic combined systolic (congestive) and diastolic (congestive) heart failure: Secondary | ICD-10-CM | POA: Diagnosis not present

## 2018-09-10 DIAGNOSIS — I480 Paroxysmal atrial fibrillation: Secondary | ICD-10-CM | POA: Diagnosis not present

## 2018-09-10 LAB — BASIC METABOLIC PANEL
Anion gap: 12 (ref 5–15)
BUN: 23 mg/dL (ref 8–23)
CO2: 24 mmol/L (ref 22–32)
CREATININE: 1.49 mg/dL — AB (ref 0.61–1.24)
Calcium: 8.8 mg/dL — ABNORMAL LOW (ref 8.9–10.3)
Chloride: 102 mmol/L (ref 98–111)
GFR, EST AFRICAN AMERICAN: 49 mL/min — AB (ref >60)
GFR, EST NON AFRICAN AMERICAN: 42 mL/min — AB (ref >60)
Glucose, Bld: 95 mg/dL (ref 70–99)
Potassium: 3.9 mmol/L (ref 3.5–5.1)
SODIUM: 138 mmol/L (ref 135–145)

## 2018-09-10 LAB — IRON AND TIBC
Iron: 50 ug/dL (ref 45–182)
SATURATION RATIOS: 15 % — AB (ref 17.9–39.5)
TIBC: 336 ug/dL (ref 250–450)
UIBC: 286 ug/dL

## 2018-09-10 LAB — GLUCOSE, CAPILLARY
GLUCOSE-CAPILLARY: 135 mg/dL — AB (ref 70–99)
GLUCOSE-CAPILLARY: 135 mg/dL — AB (ref 70–99)
Glucose-Capillary: 157 mg/dL — ABNORMAL HIGH (ref 70–99)

## 2018-09-10 LAB — FERRITIN: Ferritin: 61 ng/mL (ref 24–336)

## 2018-09-10 MED ORDER — SODIUM CHLORIDE 0.9 % IV SOLN
510.0000 mg | Freq: Once | INTRAVENOUS | Status: AC
  Administered 2018-09-10: 510 mg via INTRAVENOUS
  Filled 2018-09-10: qty 17

## 2018-09-10 MED ORDER — FUROSEMIDE 10 MG/ML IJ SOLN
40.0000 mg | Freq: Two times a day (BID) | INTRAMUSCULAR | Status: DC
  Administered 2018-09-10 (×2): 40 mg via INTRAVENOUS
  Filled 2018-09-10 (×2): qty 4

## 2018-09-10 MED ORDER — SENNOSIDES-DOCUSATE SODIUM 8.6-50 MG PO TABS: 1.0000 | ORAL_TABLET | Freq: Two times a day (BID) | ORAL | 0 refills | Status: DC

## 2018-09-10 NOTE — Discharge Instructions (Signed)
Thank you for allowing [place] to provide your care. Please continue to take all your medications as prescribed. It is important that you continue to have close follow-up with your primary doctor and cardiologist. Weigh yourself daily and if you notice your weight increases by 3lbs in 1 day or >5 lbs in 2 days take an extra dose of your torsemide. Continue to limit your fluid intake to <2L per day and salt intake to <2g per day.   Heart Failure Action Plan A heart failure action plan helps you understand what to do when you have symptoms of heart failure. Follow the plan that was created by you and your health care provider. Review your plan each time you visit your health care provider. Red zone These signs and symptoms mean you should get medical help right away:  You have trouble breathing when resting.  You have a dry cough that is getting worse.  You have swelling or pain in your legs or abdomen that is getting worse.  You suddenly gain more than 2-3 lb (0.9-1.4 kg) in a day, or more than 5 lb (2.3 kg) in one week. This amount may be more or less depending on your condition.  You have trouble staying awake or you feel confused.  You have chest pain.  You do not have an appetite.  You pass out.  If you experience any of these symptoms:  Call your local emergency services (911 in the [place] right away or seek help at the emergency department of the nearest hospital.  Yellow zone These signs and symptoms mean your condition may be getting worse and you should make some changes:  You have trouble breathing when you are active or you need to sleep with extra pillows.  You have swelling in your legs or abdomen.  You gain 2-3 lb (0.9-1.4 kg) in one day, or 5 lb (2.3 kg) in one week. This amount may be more or less depending on your condition.  You get tired easily.  You have trouble sleeping.  You have a dry cough.  If you experience any of these symptoms:  Contact your health  care provider within the next day.  Your health care provider may adjust your medicines.  Green zone These signs mean you are doing well and can continue what you are doing:  You do not have shortness of breath.  You have very little swelling or no new swelling.  Your weight is stable (no gain or loss).  You have a normal activity level.  You do not have chest pain or any other new symptoms.  Follow these instructions at home:  Take over-the-counter and prescription medicines only as told by your health care provider.  Weigh yourself daily. Your target weight is __________ lb (__________ kg). ? Call your health care provider if you gain more than __________ lb (__________ kg) in a day, or more than __________ lb (__________ kg) in one week.  Eat a heart-healthy diet. Work with a diet and nutrition specialist (dietitian) to create an eating plan that is best for you.  Keep all follow-up visits as told by your health care provider. This is important. Where to find more information:  American Heart Association: www.heart.org Summary  Follow the action plan that was created by you and your health care provider.  Get help right away if you have any symptoms in the Red zone. This information is not intended to replace advice given to you by your health care provider.  Make sure you discuss any questions you have with your health care provider. Document Released: 12/25/2016 Document Revised: 12/25/2016 Document Reviewed: 12/25/2016 Elsevier Interactive Patient Education  [employer].

## 2018-09-10 NOTE — Progress Notes (Signed)
PT CBG 157. Pt sitting in chair and reported he wanted to talk with His DR. Pt reported he does not feel good but will not give exact problems. Pt A/o . Pt reoted his wife is sitting out side the hospital in her car because the parking lot is to full and it is raining.

## 2018-09-10 NOTE — Progress Notes (Signed)
   Subjective: Patient is feeling well this AM. He is back to his "dry" weight. He did not sleep well last night but states that is normal. Tolerating PO intake. No issues with ambulation. We discussed general heart failure educations and he again re-iterates that he does everything the proper way. We will plan for discharge this evening.   Objective: Vital signs in last 24 hours: Vitals:   09/09/18 1500 09/09/18 1730 09/09/18 2123 09/10/18 0608  BP: 106/60 (!) 104/57 (!) 116/53 106/62  Pulse: 70 70 69 69  Resp:   15 16  Temp:      TempSrc:      SpO2:   97% 94%  Weight:    112.8 kg   General: Obese male in no acute distress Pulm: Good air movement with no wheezing or crackles  CV: RRR, no murmurs, no rubs  Abdomen: Soft, non-distended, no tenderness to palpation  Extremities: Moderate LE edema   Assessment/Plan:  Mr. Dibert is a 84 y.o male with combined systolic and diastolic heart failure, CAD s/p PCI, DM, HTN, and PAF who presented to the ED with acute onset dyspnea. He was subsequently admitted for an acute heart failure exacerbation. Of note the patient has had frequent admissions for heart failure exacerbations and been admitted 5 times in the last 6 months. Further management as outlined below.  Acute on Chronic Combined Systolic and Diastolic Heart Failure  - Patient's dyspnea has significantly improved - Net negative 1.5L since admission - Weight is currently 246lbs which is his reported dry weight  - Continue 1 more day of IV furosemdie 40 mg BID with plans to discharge this afternoon - Will follow-up iron studies to determine if patient would benefit from IV iron  - Will need close outpatient follow-up to prevent readmission  - Heart failure education provided  - Continue carvedilol, hydralazine, isosorbide dinitrate, and rosuvastatin.  - Restart Torsemide on discharge   Paroxysmal Atrial Fibrillation  - Continue Eliquis  - Continue Amiodarone and Carvedilol    CAD s/p LAD stent  - Continue ASA and Plavix  - Continue rosuvastatin   Dispo: Anticipated discharge today after afternoon dose of IV furosemide.   Levora Dredge, MD 09/10/2018, 7:45 AM

## 2018-09-10 NOTE — Discharge Summary (Signed)
Name: Ryan Macdonald MRN: [id number] DOB: Feb 17, 1934 84 y.o. PCP: [facility]  Date of Admission: 09/08/2018  7:24 PM Date of Discharge:  Attending Physician: Gust Rung, DO  Discharge Diagnosis: 1. Acute on Chronic Combined Systolic and Diastolic Heart Failure.  Discharge Medications: Allergies as of 09/10/2018      Reactions   Lisinopril Swelling   Facial swelling   Xarelto [rivaroxaban] Other (See Comments)   "Blood came out of my penis"      Medication List    STOP taking these medications   docusate sodium 100 MG capsule Commonly known as:  COLACE     TAKE these medications   allopurinol 100 MG tablet Commonly known as:  ZYLOPRIM Take 200 mg by mouth daily. To prevent gout   amiodarone 200 MG tablet Commonly known as:  PACERONE Take 1 tablet (200 mg total) by mouth 2 (two) times daily. What changed:  additional instructions   apixaban 2.5 MG Tabs tablet Commonly known as:  ELIQUIS Take 1 tablet (2.5 mg total) by mouth 2 (two) times daily. What changed:  additional instructions   aspirin 81 MG EC tablet Take 1 tablet (81 mg total) by mouth daily.   benzonatate 100 MG capsule Commonly known as:  TESSALON Take 1 capsule (100 mg total) by mouth 3 (three) times daily as needed for cough.   carvedilol 12.5 MG tablet Commonly known as:  COREG Take 0.5 tablets (6.25 mg total) by mouth 2 (two) times daily with a meal. What changed:  how much to take   chlorpheniramine-HYDROcodone 10-8 MG/5ML Suer Commonly known as:  TUSSIONEX Take 5 mLs by mouth every 12 (twelve) hours as needed for cough.   clopidogrel 75 MG tablet Commonly known as:  PLAVIX Take 1 tablet (75 mg total) by mouth daily with breakfast.   cyanocobalamin 1000 MCG tablet Take 1,000 mcg by mouth daily.   diclofenac sodium 1 % Gel Commonly known as:  VOLTAREN Apply 4 g topically 4 (four) times daily. What changed:    when to take this  reasons to take this     finasteride 5 MG tablet Commonly known as:  PROSCAR Take 5 mg by mouth daily.   fluticasone furoate-vilanterol 100-25 MCG/INH Aepb Commonly known as:  BREO ELLIPTA Inhale 1 puff into the lungs daily.   glipiZIDE 5 MG tablet Commonly known as:  GLUCOTROL Take 5 mg by mouth daily before breakfast.   hydrALAZINE 10 MG tablet Commonly known as:  APRESOLINE Take 1 tablet (10 mg total) by mouth every 8 (eight) hours. What changed:  additional instructions   HYDROcodone-acetaminophen 5-325 MG tablet Commonly known as:  NORCO/VICODIN Take 2 tablets by mouth every 4 (four) hours as needed. What changed:    how much to take  when to take this  reasons to take this   ipratropium-albuterol 0.5-2.5 (3) MG/3ML Soln Commonly known as:  DUONEB Take 3 mLs by nebulization every 4 (four) hours as needed. What changed:  reasons to take this   isosorbide dinitrate 20 MG tablet Commonly known as:  ISORDIL Take 20 mg by mouth 3 (three) times daily.   ketoconazole 2 % shampoo Commonly known as:  NIZORAL Apply 1 application topically 2 (two) times a week.   nitroGLYCERIN 0.4 MG SL tablet Commonly known as:  NITROSTAT Place 1 tablet (0.4 mg total) under the tongue every 5 (five) minutes x 3 doses as needed for chest pain.   PRESCRIPTION MEDICATION Apply 1 application topically daily  as needed (pain). ThermaGel   rosuvastatin 20 MG tablet Commonly known as:  CRESTOR Take 1 tablet (20 mg total) by mouth daily at 6 PM.   senna-docusate 8.6-50 MG tablet Commonly known as:  Senokot-S Take 1 tablet by mouth 2 (two) times daily.   sertraline 25 MG tablet Commonly known as:  ZOLOFT Take 25-50 mg by mouth daily as needed (for anxiety).   tamsulosin 0.4 MG Caps capsule Commonly known as:  FLOMAX Take 0.4 mg by mouth at bedtime.   torsemide 20 MG tablet Commonly known as:  DEMADEX Take one 20mg  tablet daily.  However, if weight up 3lbs in 24 hours take an extra dose that day.  If  weight is below your dry weight skip your dose that day. What changed:    how much to take  how to take this  when to take this      Disposition and follow-up:   RyanRyan Macdonald was discharged from [facility] in Stable condition.  At the hospital follow up visit please address:  1.  HFrEF. Patient has had 5 admissions in the past 6 months for HF. Discuss and action plan with the patient and ensure he understands how to execute the plan. Ensure close follow-up with cardiology.   2.  Labs / imaging needed at time of follow-up: None  3.  Pending labs/ test needing follow-up: None  Follow-up Appointments: Follow-up Information    [facility]. Schedule an appointment as soon as possible for a visit.   Specialty:  General Practice Contact information: [address] [place] [address] [phone]        [name], MD .   Specialty:  Cardiology Contact information: [address] [place] [address] [phone]          Hospital Course by problem list:  Acute on Chronic Combined Systolic and Diastolic Heart Failure. Ryan Macdonald is a 84 y.o male with combined systolic and diastolic heart failure, CAD s/p PCI, DM, HTN, and PAF who presented to the ED with acute onset dyspnea. He was subsequently admitted for an acute heart failure exacerbation. Of note the patient has had frequent admissions for heart failure exacerbations and been admitted 5 times in the last 6 months. He was aggressively diuresis with IV furosemide 40 mg BID. He achieved adequate diuresis with over 2 L negative urine output. His weight decreased from 115 kg on admission 112 kg on discharge. We discussed a heart failure action plan and I encouraged him to maintain close follow-up with his cardiologist and his primary care physician. Iron studies were checked and he was found to have an iron saturation of 15% and ferritin of 68. He was given a one-time dose  of IV iron. He was discharged in stable condition and no changes were made to his medications.  Discharge Vitals:   BP 106/62 (BP Location: Right Arm)   Pulse 69   Temp 97.8 F (36.6 C) (Oral)   Resp 16   Wt 112.8 kg   SpO2 94%   BMI 38.95 kg/m   Discharge Instructions: Discharge Instructions    (HEART FAILURE PATIENTS) Call MD:  Anytime you have any of the following symptoms: 1) 3 pound weight gain in 24 hours or 5 pounds in 1 week 2) shortness of breath, with or without a dry hacking cough 3) swelling in the hands, feet or stomach 4) if you have to sleep on extra pillows at night in order  to breathe.   Complete by:  As directed    Diet - low sodium heart healthy   Complete by:  As directed    Increase activity slowly   Complete by:  As directed      Signed: Levora Dredge, MD 09/10/2018, 9:16 AM

## 2018-09-11 ENCOUNTER — Telehealth: Payer: Self-pay | Admitting: Cardiovascular Disease

## 2018-09-11 LAB — GLUCOSE, CAPILLARY: Glucose-Capillary: 137 mg/dL — ABNORMAL HIGH (ref 70–99)

## 2018-09-11 NOTE — Telephone Encounter (Signed)
Left message, verbal order given.

## 2018-09-11 NOTE — Telephone Encounter (Signed)
New message:      Ryan Macdonald is requesting for some verbal orders to continue seeing the pt 1x a week and 2 prn visits for medication/education/CHF.

## 2018-09-14 ENCOUNTER — Telehealth: Payer: Self-pay | Admitting: Cardiovascular Disease

## 2018-09-14 ENCOUNTER — Encounter (HOSPITAL_COMMUNITY): Payer: Self-pay

## 2018-09-14 ENCOUNTER — Emergency Department (HOSPITAL_COMMUNITY)
Admission: EM | Admit: 2018-09-14 | Discharge: 2018-09-14 | Disposition: A | Discharge status: Home / Self Care | Payer: No Typology Code available for payment source | Attending: Emergency Medicine | Admitting: Emergency Medicine

## 2018-09-14 ENCOUNTER — Emergency Department (HOSPITAL_COMMUNITY): Payer: No Typology Code available for payment source

## 2018-09-14 DIAGNOSIS — Z7982 Long term (current) use of aspirin: Secondary | ICD-10-CM | POA: Insufficient documentation

## 2018-09-14 DIAGNOSIS — E1122 Type 2 diabetes mellitus with diabetic chronic kidney disease: Secondary | ICD-10-CM | POA: Insufficient documentation

## 2018-09-14 DIAGNOSIS — N189 Chronic kidney disease, unspecified: Secondary | ICD-10-CM | POA: Diagnosis not present

## 2018-09-14 DIAGNOSIS — I130 Hypertensive heart and chronic kidney disease with heart failure and stage 1 through stage 4 chronic kidney disease, or unspecified chronic kidney disease: Secondary | ICD-10-CM | POA: Insufficient documentation

## 2018-09-14 DIAGNOSIS — Z87891 Personal history of nicotine dependence: Secondary | ICD-10-CM | POA: Insufficient documentation

## 2018-09-14 DIAGNOSIS — Z7902 Long term (current) use of antithrombotics/antiplatelets: Secondary | ICD-10-CM | POA: Insufficient documentation

## 2018-09-14 DIAGNOSIS — Z79899 Other long term (current) drug therapy: Secondary | ICD-10-CM | POA: Insufficient documentation

## 2018-09-14 DIAGNOSIS — Z96642 Presence of left artificial hip joint: Secondary | ICD-10-CM | POA: Diagnosis not present

## 2018-09-14 DIAGNOSIS — I509 Heart failure, unspecified: Secondary | ICD-10-CM | POA: Diagnosis not present

## 2018-09-14 DIAGNOSIS — R0602 Shortness of breath: Secondary | ICD-10-CM | POA: Insufficient documentation

## 2018-09-14 LAB — COMPREHENSIVE METABOLIC PANEL
ALBUMIN: 3.2 g/dL — AB (ref 3.5–5.0)
ALK PHOS: 68 U/L (ref 38–126)
ALT: 45 U/L — AB (ref 0–44)
ANION GAP: 9 (ref 5–15)
AST: 47 U/L — ABNORMAL HIGH (ref 15–41)
BUN: 19 mg/dL (ref 8–23)
CHLORIDE: 102 mmol/L (ref 98–111)
CO2: 30 mmol/L (ref 22–32)
Calcium: 9.1 mg/dL (ref 8.9–10.3)
Creatinine, Ser: 1.83 mg/dL — ABNORMAL HIGH (ref 0.61–1.24)
GFR calc Af Amer: 38 mL/min — ABNORMAL LOW (ref >60)
GFR calc non Af Amer: 33 mL/min — ABNORMAL LOW (ref >60)
GLUCOSE: 136 mg/dL — AB (ref 70–99)
Potassium: 4.4 mmol/L (ref 3.5–5.1)
SODIUM: 141 mmol/L (ref 135–145)
Total Bilirubin: 0.7 mg/dL (ref 0.3–1.2)
Total Protein: 7.1 g/dL (ref 6.5–8.1)

## 2018-09-14 LAB — CBC WITH DIFFERENTIAL/PLATELET
ABS IMMATURE GRANULOCYTES: 0.03 10*3/uL (ref 0.00–0.07)
BASOS ABS: 0 10*3/uL (ref 0.0–0.1)
BASOS PCT: 0 %
Eosinophils Absolute: 0.2 10*3/uL (ref 0.0–0.5)
Eosinophils Relative: 3 %
HCT: 35.1 % — ABNORMAL LOW (ref 39.0–52.0)
HEMOGLOBIN: 10.5 g/dL — AB (ref 13.0–17.0)
Immature Granulocytes: 1 %
LYMPHS ABS: 0.9 10*3/uL (ref 0.7–4.0)
Lymphocytes Relative: 14 %
MCH: 29.5 pg (ref 26.0–34.0)
MCHC: 29.9 g/dL — AB (ref 30.0–36.0)
MCV: 98.6 fL (ref 80.0–100.0)
MONOS PCT: 10 %
Monocytes Absolute: 0.6 10*3/uL (ref 0.1–1.0)
NRBC: 0.3 % — AB (ref 0.0–0.2)
Neutro Abs: 4.6 10*3/uL (ref 1.7–7.7)
Neutrophils Relative %: 72 %
PLATELETS: 233 10*3/uL (ref 150–400)
RBC: 3.56 MIL/uL — ABNORMAL LOW (ref 4.22–5.81)
RDW: 15.8 % — ABNORMAL HIGH (ref 11.5–15.5)
WBC: 6.4 10*3/uL (ref 4.0–10.5)

## 2018-09-14 LAB — BRAIN NATRIURETIC PEPTIDE: B Natriuretic Peptide: 706.6 pg/mL — ABNORMAL HIGH (ref 0.0–100.0)

## 2018-09-14 NOTE — ED Notes (Signed)
Pt. Ambulated throughout the halls. Sats stayed between 99-100%. No SOB stated. Pt. Felt slightly dizzy upon arrival back in room.

## 2018-09-14 NOTE — Telephone Encounter (Signed)
New Message          [facility] is needing the verbal fax order signed copy by Dr Sharyne Richters faxed to [phone]/ [phone] use if other # is busy Office # [phone].

## 2018-09-14 NOTE — ED Triage Notes (Signed)
Pt comes via [facility] for anxiety and unable to sleep, denies pain and SOB. Pt states that he slept all day and now he cant sleep

## 2018-09-14 NOTE — Telephone Encounter (Signed)
Spoke with pt. Pt sts that he was seen yesterday in the ED for sob. He was told to contact our office to update Dr.[place]. Pt sts that he was told that his SOB was related to anxiety. He was was given Sertraline and will need to be seen at the [place] in [place] to have that continued. He is not scheduled to see Dr.Randolp until December but was told he needs to be seen soon. He sts that the last couple of times he was seen by an APP, he would like to see Dr.Stafford this time. He also needs help coordinating his [facility], he sts that Juliette Alcide will know how to help him.  Adv pt that I will send a message to Ardelle Balls f/u with him about an earlier appt with Dr.Vincent. He voiced appreciation.

## 2018-09-14 NOTE — Telephone Encounter (Signed)
Left message to call back, appointments available tomorrow and next

## 2018-09-14 NOTE — Discharge Instructions (Addendum)
Call your cardiologist for an appointment in office for recheck of your shortness of breath. Return here with any worsening symptoms or new concerns.

## 2018-09-14 NOTE — Telephone Encounter (Signed)
Spoke with [employer] and she faxed over 2 orders needing signatures. Advised received one today and will have it signed tomorrow. One she signed today and will fax back before 5

## 2018-09-14 NOTE — Telephone Encounter (Signed)
New Message   Patient is calling because he was at the ER last night and was advised to call and let the Doctor know.

## 2018-09-14 NOTE — Telephone Encounter (Signed)
Spoke to patient and offered appointment for tomorrow, he will be at [place]. First available that worked for patient was Wednesday 10/23, scheduled visit with Dr Duke Salvia

## 2018-09-14 NOTE — ED Provider Notes (Signed)
[name] EMERGENCY DEPARTMENT Provider Note   CSN: [id number] Arrival date & time: 09/14/18  0303     History   Chief Complaint Chief Complaint  Patient presents with  . Anxiety    HPI Ryan Macdonald is a 85 y.o. male.  Patient with a history of CHF, HTN, AV block s/p pacemaker, DM, CAP, nonischemic cardiomyopathy, HLD presents via EMS after waking this evening and developing mild SOB. He was unable to go back to sleep and became concerned that his SOB represented a CHF exacerbation. He took an extra Lasix as he has been instructed to do without significant relief. No chest pain. He reports LE edema but not significantly worse than usual. He was seen here a couple of days ago and told he had "some fluid" in his lungs and felt he would not be comfortable until he had it checked tonight, prompting ED visit. No cough, fever, nausea, vomiting, abdominal pain.  The history is provided by the patient and the EMS personnel. No language interpreter was used.  Anxiety  Associated symptoms include shortness of breath. Pertinent negatives include no chest pain.    Past Medical History:  Diagnosis Date  . Atrial fibrillation (HCC)   . Cardiomyopathy (HCC)   . Cat scratch fever    removed mass on right side neck  . Cataracts, bilateral   . CHF (congestive heart failure) (HCC)   . Chronic kidney disease   . Corns and callosities   . Diabetes mellitus without complication (HCC)    TYPE 2  . Dyspnea   . Erythema intertrigo   . Flat foot   . Gout   . High cholesterol   . Hx of urethral stricture   . Hypertension   . Kidney stones   . Morbid obesity (HCC)   . Nonischemic cardiomyopathy (HCC)    a. ? 2009 Cath in [place] - nl cors per pt;  b. 09/2013 Echo: EF 25-30%, sev diff HK.  . Obesity   . Osteoarthritis   . PAF (paroxysmal atrial fibrillation) (HCC)    a. post-op hip in 2014 - prev on xarelto.  . Renal insufficiency     Patient Active Problem List   Diagnosis Date Noted  . Morbid obesity (HCC) 09/09/2018  . URI (upper respiratory infection) 08/31/2018  . UTI (urinary tract infection) 08/31/2018  . Acute on chronic systolic (congestive) heart failure (HCC) 08/28/2018  . CHF (congestive heart failure) (HCC) 08/14/2018  . Acute on chronic combined systolic and diastolic congestive heart failure (HCC) 07/18/2018  . First degree AV block   . Shortness of breath 04/29/2018  . Acute HFrEF (heart failure with reduced ejection fraction) (HCC) 04/29/2018  . Acute respiratory failure with hypoxia (HCC) 09/27/2017  . CHF exacerbation (HCC) 09/27/2017  . Renal insufficiency   . Acute on chronic systolic CHF (congestive heart failure) (HCC) 07/08/2017  . Left knee pain 07/08/2017  . Acute bronchitis 05/10/2017  . Acute congestive heart failure (HCC) 04/14/2017  . Elevated troponin 04/14/2017  . Lactic acidosis   . Acute on chronic systolic heart failure, NYHA class 2 (HCC) 02/03/2017  . Diabetes mellitus type 2 in obese (HCC) 02/03/2017  . CAP (community acquired pneumonia) 02/03/2017  . Lobar pneumonia (HCC)   . Diabetes mellitus with complication (HCC)   . Acute on chronic respiratory failure with hypoxia (HCC)   . Acute on chronic combined systolic and diastolic heart failure (HCC) 05/19/2016  . Depression 05/19/2016  . Gout 05/19/2016  .  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 NSVT (nonsustained ventricular tachycardia) (HCC)   . Chronic renal insufficiency, stage 3 (moderate) (HCC)   . Pulmonary embolism (HCC)   . Acute respiratory failure (HCC)   . Osteoarthritis of left hip 10/25/2013  . Atrial fibrillation (HCC) 10/25/2013  . S/P hip replacement 10/24/2013  . Nonischemic cardiomyopathy (HCC)   . Obesity (BMI 30-39.9)   . Essential hypertension   . Hyperlipidemia     Past Surgical History:  Procedure Laterality Date  . CARDIAC CATHETERIZATION  1980's  . CIRCUMCISION  1940's  . CORONARY STENT  INTERVENTION N/A 08/18/2018   Procedure: CORONARY STENT INTERVENTION;  Surgeon: Corky Crafts, MD;  Location: [facility] INVASIVE CV LAB;  Service: Cardiovascular;  Laterality: N/A;  . CYSTOSCOPY WITH URETHRAL DILATATION  10/23/2013  . CYSTOSCOPY WITH URETHRAL DILATATION N/A 10/23/2013   Procedure: CYSTOSCOPY WITH URETHRAL DILATATION;  Surgeon: Kathi Ludwig, MD;  Location: [facility] OR;  Service: Urology;  Laterality: N/A;  . INCISION AND DRAINAGE OF WOUND Right 1980's   "cat scratch" (10/23/2013)  . INGUINAL HERNIA REPAIR Right 1950's  . INTRAVASCULAR ULTRASOUND/IVUS N/A 08/18/2018   Procedure: Intravascular Ultrasound/IVUS;  Surgeon: Corky Crafts, MD;  Location: [facility] INVASIVE CV LAB;  Service: Cardiovascular;  Laterality: N/A;  . KIDNEY STONE SURGERY Right 1970's; 1983   "twice"  . LEFT HEART CATH AND CORONARY ANGIOGRAPHY N/A 08/18/2018   Procedure: LEFT HEART CATH AND CORONARY ANGIOGRAPHY;  Surgeon: Corky Crafts, MD;  Location: [facility] INVASIVE CV LAB;  Service: Cardiovascular;  Laterality: N/A;  . TONSILLECTOMY AND ADENOIDECTOMY  1940's  . TOTAL HIP ARTHROPLASTY Left 10/23/2013   Procedure: TOTAL HIP ARTHROPLASTY;  Surgeon: Valeria Batman, MD;  Location: [facility] OR;  Service: Orthopedics;  Laterality: Left;        Home Medications    Prior to Admission medications   Medication Sig Start Date End Date Taking? Authorizing Provider  allopurinol (ZYLOPRIM) 100 MG tablet Take 200 mg by mouth daily. To prevent gout    [provider]  amiodarone (PACERONE) 200 MG tablet Take 1 tablet (200 mg total) by mouth 2 (two) times daily. Patient taking differently: Take 200 mg by mouth 2 (two) times daily. For heart rhythm control 02/16/18   Burna Cash, MD  apixaban (ELIQUIS) 2.5 MG TABS tablet Take 1 tablet (2.5 mg total) by mouth 2 (two) times daily. Patient taking differently: Take 2.5 mg by mouth 2 (two) times daily. Caution - blood thinner 05/04/18   Angelita Ingles,  MD  aspirin EC 81 MG EC tablet Take 1 tablet (81 mg total) by mouth daily. 08/20/18   Arty Baumgartner, NP  benzonatate (TESSALON) 100 MG capsule Take 1 capsule (100 mg total) by mouth 3 (three) times daily as needed for cough. 09/06/18   Jodelle Gross, NP  carvedilol (COREG) 12.5 MG tablet Take 0.5 tablets (6.25 mg total) by mouth 2 (two) times daily with a meal. Patient taking differently: Take 12.5 mg by mouth 2 (two) times daily with a meal.  04/15/17   Osvaldo Shipper, MD  chlorpheniramine-HYDROcodone [facility] PENNKINETIC ER) 10-8 MG/5ML SUER Take 5 mLs by mouth every 12 (twelve) hours as needed for cough. 09/06/18   Jodelle Gross, NP  clopidogrel (PLAVIX) 75 MG tablet Take 1 tablet (75 mg total) by mouth daily with breakfast. 08/20/18   Laverda Page B, NP  cyanocobalamin 1000 MCG tablet Take 1,000 mcg by mouth daily.     [provider]  diclofenac sodium (VOLTAREN) 1 % GEL Apply 4 g topically 4 (four) times daily. Patient taking differently: Apply 4 g topically 4 (four) times daily as needed (pain).  07/10/17   Valentino Nose, MD  finasteride (PROSCAR) 5 MG tablet Take 5 mg by mouth daily.     [provider]  fluticasone furoate-vilanterol (BREO ELLIPTA) 100-25 MCG/INH AEPB Inhale 1 puff into the lungs daily. Patient not taking: Reported on 09/08/2018 05/05/18   Angelita Ingles, MD  glipiZIDE (GLUCOTROL) 5 MG tablet Take 5 mg by mouth daily before breakfast.    [provider]  hydrALAZINE (APRESOLINE) 10 MG tablet Take 1 tablet (10 mg total) by mouth every 8 (eight) hours. Patient taking differently: Take 10 mg by mouth every 8 (eight) hours. For blood pressure 08/19/18   Laverda Page B, NP  HYDROcodone-acetaminophen (NORCO/VICODIN) 5-325 MG tablet Take 2 tablets by mouth every 4 (four) hours as needed. Patient taking differently: Take 1 tablet by mouth 2 (two) times daily as needed for moderate pain.  04/09/16   Isa Rankin, MD    ipratropium-albuterol (DUONEB) 0.5-2.5 (3) MG/3ML SOLN Take 3 mLs by nebulization every 4 (four) hours as needed. Patient taking differently: Take 3 mLs by nebulization every 4 (four) hours as needed (shortness of breath).  05/14/17   Pearson Grippe, MD  isosorbide dinitrate (ISORDIL) 20 MG tablet Take 20 mg by mouth 3 (three) times daily.    [provider]  ketoconazole (NIZORAL) 2 % shampoo Apply 1 application topically 2 (two) times a week.    [provider]  nitroGLYCERIN (NITROSTAT) 0.4 MG SL tablet Place 1 tablet (0.4 mg total) under the tongue every 5 (five) minutes x 3 doses as needed for chest pain. 08/19/18   Arty Baumgartner, NP  PRESCRIPTION MEDICATION Apply 1 application topically daily as needed (pain). ThermaGel    [provider]  rosuvastatin (CRESTOR) 20 MG tablet Take 1 tablet (20 mg total) by mouth daily at 6 PM. Patient not taking: Reported on 09/08/2018 05/04/18   Angelita Ingles, MD  senna-docusate (SENOKOT S) 8.6-50 MG tablet Take 1 tablet by mouth 2 (two) times daily. 09/10/18   Levora Dredge, MD  sertraline (ZOLOFT) 25 MG tablet Take 25-50 mg by mouth daily as needed (for anxiety).     [provider]  tamsulosin (FLOMAX) 0.4 MG CAPS capsule Take 0.4 mg by mouth at bedtime.    [provider]  torsemide (DEMADEX) 20 MG tablet Take one 20mg  tablet daily.  However, if weight up 3lbs in 24 hours take an extra dose that day.  If weight is below your dry weight skip your dose that day. Patient taking differently: Take 20 mg by mouth See admin instructions. Take one 20mg  tablet daily.  However, if weight up 3lbs in 24 hours take an extra dose that day.  If weight is below your dry weight skip your dose that day. 07/20/18   Barrett, Joline Salt, PA-C    Family History Family History  Problem Relation Age of Onset  . Heart disease Mother   . Heart Problems Maternal Grandmother   . Other Unknown        negative for premature CAD     Social History Social History   Tobacco Use  . Smoking status: Former Smoker    Packs/day: 1.00    Years: 15.00    Pack years: 15.00    Types: Cigarettes, Cigars  . Smokeless tobacco: Former Neurosurgeon  . Tobacco  comment: quit smoking ~ 50 yr ago  Substance Use Topics  . Alcohol use: No    Alcohol/week: 2.0 standard drinks    Types: 2 Cans of beer per week    Comment: rare beer.  . Drug use: No     Allergies   Lisinopril and Xarelto [rivaroxaban]   Review of Systems Review of Systems  Constitutional: Negative for chills and fever.  HENT: Negative.   Respiratory: Positive for shortness of breath. Negative for cough.   Cardiovascular: Negative.  Negative for chest pain.  Gastrointestinal: Negative.  Negative for nausea and vomiting.  Musculoskeletal: Negative.   Skin: Negative.   Neurological: Negative.      Physical Exam Updated Vital Signs BP 110/87   Pulse 70   Temp 98.7 F (37.1 C) (Oral)   Resp 20   SpO2 97%   Physical Exam  Constitutional: He is oriented to person, place, and time. He appears well-developed and well-nourished.  HENT:  Head: Normocephalic.  Neck: Normal range of motion. Neck supple.  Cardiovascular: Normal rate and regular rhythm.  No murmur heard. Pulmonary/Chest: Effort normal and breath sounds normal. He exhibits no tenderness.  Decreased air movement. Diffuse lower lobe rales bilaterally. No wheezing.  Abdominal: Soft. Bowel sounds are normal. There is no tenderness. There is no rebound and no guarding.  Musculoskeletal: Normal range of motion. He exhibits edema. He exhibits no tenderness.  Neurological: He is alert and oriented to person, place, and time.  Skin: Skin is warm and dry. No rash noted.  Psychiatric: He has a normal mood and affect.     ED Treatments / Results  Labs (all labs ordered are listed, but only abnormal results are displayed) Labs Reviewed  CBC WITH DIFFERENTIAL/PLATELET  COMPREHENSIVE METABOLIC PANEL   BRAIN NATRIURETIC PEPTIDE    EKG None  Radiology No results found.  Procedures Procedures (including critical care time)  Medications Ordered in ED Medications - No data to display   Initial Impression / Assessment and Plan / ED Course  I have reviewed the triage vital signs and the nursing notes.  Pertinent labs & imaging results that were available during my care of the patient were reviewed by me and considered in my medical decision making (see chart for details).     Patient woke this evening from sleep and became concerned that he felt short of breath. No fever, cough, pain.  He has a history of CHF and was recently told he had accumulated fluid in his lungs and was afraid it was worsening.   CXR shows CM with central congestion, no pleural effusions. BNP is essentially c/w previous. No hypoxia. He is ambulated and does not drop O2 saturations. He is in NAD.  He is felt appropriate for discharge home. Recommended he follow up with Cardiology this week for recheck.   Final Clinical Impressions(s) / ED Diagnoses   Final diagnoses:  None   1. Shortness of breath  ED Discharge Orders    None       Danne Harbor 09/14/18 2956    Dione Booze, MD 09/14/18 0700

## 2018-09-15 ENCOUNTER — Telehealth: Payer: Self-pay | Admitting: Cardiovascular Disease

## 2018-09-15 NOTE — Telephone Encounter (Signed)
Patient had questions regarding his Torsemide and when he should take an extra, I explained to patient to take one pill daily, and to weigh himself, if he gains 3lbs in 24 hours he would take an extra. Patient verbalized understanding, he does have appointment with Dr. on 09/20/18, next week. Patient had no other questions at this time.

## 2018-09-15 NOTE — Telephone Encounter (Signed)
New Message:     Please call, been having some anxiety spells, his [facility] told him to call Dr Duke Salvia. He also have some question about his Torsemide,

## 2018-09-17 ENCOUNTER — Encounter (HOSPITAL_COMMUNITY): Payer: Self-pay

## 2018-09-17 ENCOUNTER — Emergency Department (HOSPITAL_COMMUNITY)
Admission: EM | Admit: 2018-09-17 | Discharge: 2018-09-17 | Disposition: A | Discharge status: Home / Self Care | Payer: No Typology Code available for payment source | Attending: Emergency Medicine | Admitting: Emergency Medicine

## 2018-09-17 ENCOUNTER — Other Ambulatory Visit: Payer: Self-pay

## 2018-09-17 ENCOUNTER — Emergency Department (HOSPITAL_COMMUNITY): Payer: No Typology Code available for payment source

## 2018-09-17 DIAGNOSIS — I130 Hypertensive heart and chronic kidney disease with heart failure and stage 1 through stage 4 chronic kidney disease, or unspecified chronic kidney disease: Secondary | ICD-10-CM | POA: Diagnosis not present

## 2018-09-17 DIAGNOSIS — Z87891 Personal history of nicotine dependence: Secondary | ICD-10-CM | POA: Insufficient documentation

## 2018-09-17 DIAGNOSIS — Z79899 Other long term (current) drug therapy: Secondary | ICD-10-CM | POA: Diagnosis not present

## 2018-09-17 DIAGNOSIS — N183 Chronic kidney disease, stage 3 (moderate): Secondary | ICD-10-CM | POA: Diagnosis not present

## 2018-09-17 DIAGNOSIS — Z7901 Long term (current) use of anticoagulants: Secondary | ICD-10-CM | POA: Diagnosis not present

## 2018-09-17 DIAGNOSIS — R0602 Shortness of breath: Secondary | ICD-10-CM | POA: Insufficient documentation

## 2018-09-17 DIAGNOSIS — I5040 Unspecified combined systolic (congestive) and diastolic (congestive) heart failure: Secondary | ICD-10-CM | POA: Diagnosis not present

## 2018-09-17 DIAGNOSIS — Z95810 Presence of automatic (implantable) cardiac defibrillator: Secondary | ICD-10-CM | POA: Insufficient documentation

## 2018-09-17 DIAGNOSIS — Z7984 Long term (current) use of oral hypoglycemic drugs: Secondary | ICD-10-CM | POA: Insufficient documentation

## 2018-09-17 DIAGNOSIS — E1122 Type 2 diabetes mellitus with diabetic chronic kidney disease: Secondary | ICD-10-CM | POA: Diagnosis not present

## 2018-09-17 DIAGNOSIS — Z7982 Long term (current) use of aspirin: Secondary | ICD-10-CM | POA: Insufficient documentation

## 2018-09-17 LAB — CBC WITH DIFFERENTIAL/PLATELET
ABS IMMATURE GRANULOCYTES: 0.06 10*3/uL (ref 0.00–0.07)
BASOS ABS: 0 10*3/uL (ref 0.0–0.1)
BASOS PCT: 0 %
Eosinophils Absolute: 0.3 10*3/uL (ref 0.0–0.5)
Eosinophils Relative: 4 %
HCT: 36.7 % — ABNORMAL LOW (ref 39.0–52.0)
Hemoglobin: 11 g/dL — ABNORMAL LOW (ref 13.0–17.0)
IMMATURE GRANULOCYTES: 1 %
Lymphocytes Relative: 14 %
Lymphs Abs: 1.1 10*3/uL (ref 0.7–4.0)
MCH: 29.7 pg (ref 26.0–34.0)
MCHC: 30 g/dL (ref 30.0–36.0)
MCV: 99.2 fL (ref 80.0–100.0)
Monocytes Absolute: 0.8 10*3/uL (ref 0.1–1.0)
Monocytes Relative: 10 %
NEUTROS ABS: 5.9 10*3/uL (ref 1.7–7.7)
NEUTROS PCT: 71 %
PLATELETS: 229 10*3/uL (ref 150–400)
RBC: 3.7 MIL/uL — AB (ref 4.22–5.81)
RDW: 16.7 % — ABNORMAL HIGH (ref 11.5–15.5)
WBC: 8.2 10*3/uL (ref 4.0–10.5)
nRBC: 0 % (ref 0.0–0.2)

## 2018-09-17 LAB — COMPREHENSIVE METABOLIC PANEL
ALBUMIN: 3.3 g/dL — AB (ref 3.5–5.0)
ALT: 38 U/L (ref 0–44)
AST: 41 U/L (ref 15–41)
Alkaline Phosphatase: 67 U/L (ref 38–126)
Anion gap: 7 (ref 5–15)
BUN: 21 mg/dL (ref 8–23)
CHLORIDE: 103 mmol/L (ref 98–111)
CO2: 28 mmol/L (ref 22–32)
CREATININE: 1.77 mg/dL — AB (ref 0.61–1.24)
Calcium: 9 mg/dL (ref 8.9–10.3)
GFR calc non Af Amer: 34 mL/min — ABNORMAL LOW (ref >60)
GFR, EST AFRICAN AMERICAN: 39 mL/min — AB (ref >60)
Glucose, Bld: 99 mg/dL (ref 70–99)
Potassium: 3.9 mmol/L (ref 3.5–5.1)
SODIUM: 138 mmol/L (ref 135–145)
Total Bilirubin: 0.8 mg/dL (ref 0.3–1.2)
Total Protein: 8 g/dL (ref 6.5–8.1)

## 2018-09-17 LAB — I-STAT TROPONIN, ED: Troponin i, poc: 0.02 ng/mL (ref 0.00–0.08)

## 2018-09-17 LAB — BRAIN NATRIURETIC PEPTIDE: B NATRIURETIC PEPTIDE 5: 673 pg/mL — AB (ref 0.0–100.0)

## 2018-09-17 MED ORDER — FUROSEMIDE 10 MG/ML IJ SOLN
40.0000 mg | Freq: Once | INTRAMUSCULAR | Status: AC
  Administered 2018-09-17: 40 mg via INTRAVENOUS
  Filled 2018-09-17: qty 4

## 2018-09-17 NOTE — ED Provider Notes (Signed)
[name] EMERGENCY DEPARTMENT Provider Note   CSN: [id number] Arrival date & time: 09/17/18  1736     History   Chief Complaint Chief Complaint  Patient presents with  . Shortness of Breath    HPI Ryan Macdonald is a 84 y.o. male.  Patient is an 84 year old male with a history of cardiomyopathy, CHF, diabetes, recent stent within the last month and discharge 3 to 4 days ago after CHF exacerbation presenting today with shortness of breath.  Patient states while he was just sitting on the couch watching football today he started feeling more short of breath.  He states that shortness of breath has persisted and he was concerned.  He has not had any change in his weight and has been taking his medications as prescribed.  He denies eating anything salty.  He has had no chest pain, cough, fever, abdominal pain, nausea or vomiting.  He does not wear oxygen at home.  He denies any recent medication changes.  Patient was seen in the emergency room 3 days ago for shortness of breath and at that time CHF appeared to be unchanged and felt that his issues were due to anxiety.  Patient states he has been okay at home until today  The history is provided by the patient.  Shortness of Breath  This is a recurrent problem. The average episode lasts 6 hours. The problem occurs continuously.The current episode started 6 to 12 hours ago. The problem has been gradually worsening. Pertinent negatives include no fever, no headaches, no neck pain, no cough and no chest pain. It is unknown what precipitated the problem. He has tried nothing for the symptoms. The treatment provided no relief. He has had prior hospitalizations. He has had prior ED visits. Associated medical issues include CAD and heart failure.    Past Medical History:  Diagnosis Date  . Atrial fibrillation (HCC)   . Cardiomyopathy (HCC)   . Cat scratch fever    removed mass on right side neck  . Cataracts, bilateral   .  CHF (congestive heart failure) (HCC)   . Chronic kidney disease   . Corns and callosities   . Diabetes mellitus without complication (HCC)    TYPE 2  . Dyspnea   . Erythema intertrigo   . Flat foot   . Gout   . High cholesterol   . Hx of urethral stricture   . Hypertension   . Kidney stones   . Morbid obesity (HCC)   . Nonischemic cardiomyopathy (HCC)    a. ? 2009 Cath in [place] - nl cors per pt;  b. 09/2013 Echo: EF 25-30%, sev diff HK.  . Obesity   . Osteoarthritis   . PAF (paroxysmal atrial fibrillation) (HCC)    a. post-op hip in 2014 - prev on xarelto.  . Renal insufficiency     Patient Active Problem List   Diagnosis Date Noted  . Morbid obesity (HCC) 09/09/2018  . URI (upper respiratory infection) 08/31/2018  . UTI (urinary tract infection) 08/31/2018  . Acute on chronic systolic (congestive) heart failure (HCC) 08/28/2018  . CHF (congestive heart failure) (HCC) 08/14/2018  . Acute on chronic combined systolic and diastolic congestive heart failure (HCC) 07/18/2018  . First degree AV block   . Shortness of breath 04/29/2018  . Acute HFrEF (heart failure with reduced ejection fraction) (HCC) 04/29/2018  . Acute respiratory failure with hypoxia (HCC) 09/27/2017  . CHF exacerbation (HCC) 09/27/2017  . Renal insufficiency   .  Acute on chronic systolic CHF (congestive heart failure) (HCC) 07/08/2017  . Left knee pain 07/08/2017  . Acute bronchitis 05/10/2017  . Acute congestive heart failure (HCC) 04/14/2017  . Elevated troponin 04/14/2017  . Lactic acidosis   . Acute on chronic systolic heart failure, NYHA class 2 (HCC) 02/03/2017  . Diabetes mellitus type 2 in obese (HCC) 02/03/2017  . CAP (community acquired pneumonia) 02/03/2017  . Lobar pneumonia (HCC)   . Diabetes mellitus with complication (HCC)   . Acute on chronic respiratory failure with hypoxia (HCC)   . Acute on chronic combined systolic and diastolic heart failure (HCC) 05/19/2016  . Depression 05/19/2016    . Gout 05/19/2016  . 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 NSVT (nonsustained ventricular tachycardia) (HCC)   . Chronic renal insufficiency, stage 3 (moderate) (HCC)   . Pulmonary embolism (HCC)   . Acute respiratory failure (HCC)   . Osteoarthritis of left hip 10/25/2013  . Atrial fibrillation (HCC) 10/25/2013  . S/P hip replacement 10/24/2013  . Nonischemic cardiomyopathy (HCC)   . Obesity (BMI 30-39.9)   . Essential hypertension   . Hyperlipidemia     Past Surgical History:  Procedure Laterality Date  . CARDIAC CATHETERIZATION  1980's  . CIRCUMCISION  1940's  . CORONARY STENT INTERVENTION N/A 08/18/2018   Procedure: CORONARY STENT INTERVENTION;  Surgeon: Corky Crafts, MD;  Location: [facility] INVASIVE CV LAB;  Service: Cardiovascular;  Laterality: N/A;  . CYSTOSCOPY WITH URETHRAL DILATATION  10/23/2013  . CYSTOSCOPY WITH URETHRAL DILATATION N/A 10/23/2013   Procedure: CYSTOSCOPY WITH URETHRAL DILATATION;  Surgeon: Kathi Ludwig, MD;  Location: [facility] OR;  Service: Urology;  Laterality: N/A;  . INCISION AND DRAINAGE OF WOUND Right 1980's   "cat scratch" (10/23/2013)  . INGUINAL HERNIA REPAIR Right 1950's  . INTRAVASCULAR ULTRASOUND/IVUS N/A 08/18/2018   Procedure: Intravascular Ultrasound/IVUS;  Surgeon: Corky Crafts, MD;  Location: [facility] INVASIVE CV LAB;  Service: Cardiovascular;  Laterality: N/A;  . KIDNEY STONE SURGERY Right 1970's; 1983   "twice"  . LEFT HEART CATH AND CORONARY ANGIOGRAPHY N/A 08/18/2018   Procedure: LEFT HEART CATH AND CORONARY ANGIOGRAPHY;  Surgeon: Corky Crafts, MD;  Location: [facility] INVASIVE CV LAB;  Service: Cardiovascular;  Laterality: N/A;  . TONSILLECTOMY AND ADENOIDECTOMY  1940's  . TOTAL HIP ARTHROPLASTY Left 10/23/2013   Procedure: TOTAL HIP ARTHROPLASTY;  Surgeon: Valeria Batman, MD;  Location: [facility] OR;  Service: Orthopedics;  Laterality: Left;        Home Medications    Prior to Admission  medications   Medication Sig Start Date End Date Taking? Authorizing Provider  allopurinol (ZYLOPRIM) 100 MG tablet Take 200 mg by mouth daily. To prevent gout    [provider]  amiodarone (PACERONE) 200 MG tablet Take 1 tablet (200 mg total) by mouth 2 (two) times daily. Patient taking differently: Take 200 mg by mouth 2 (two) times daily. For heart rhythm control 02/16/18   Burna Cash, MD  apixaban (ELIQUIS) 2.5 MG TABS tablet Take 1 tablet (2.5 mg total) by mouth 2 (two) times daily. Patient taking differently: Take 2.5 mg by mouth 2 (two) times daily. Caution - blood thinner 05/04/18   Angelita Ingles, MD  aspirin EC 81 MG EC tablet Take 1 tablet (81 mg total) by mouth daily. 08/20/18   Arty Baumgartner, NP  benzonatate (TESSALON) 100 MG capsule Take 1 capsule (100 mg total) by mouth 3 (three) times daily as needed for cough. 09/06/18  Jodelle Gross, NP  carvedilol (COREG) 12.5 MG tablet Take 0.5 tablets (6.25 mg total) by mouth 2 (two) times daily with a meal. Patient taking differently: Take 12.5 mg by mouth 2 (two) times daily with a meal.  04/15/17   Osvaldo Shipper, MD  chlorpheniramine-HYDROcodone [facility] PENNKINETIC ER) 10-8 MG/5ML SUER Take 5 mLs by mouth every 12 (twelve) hours as needed for cough. 09/06/18   Jodelle Gross, NP  clopidogrel (PLAVIX) 75 MG tablet Take 1 tablet (75 mg total) by mouth daily with breakfast. 08/20/18   Laverda Page B, NP  cyanocobalamin 1000 MCG tablet Take 1,000 mcg by mouth daily.     [provider]  diclofenac sodium (VOLTAREN) 1 % GEL Apply 4 g topically 4 (four) times daily. Patient taking differently: Apply 4 g topically 4 (four) times daily as needed (pain).  07/10/17   Valentino Nose, MD  finasteride (PROSCAR) 5 MG tablet Take 5 mg by mouth daily.     [provider]  fluticasone furoate-vilanterol (BREO ELLIPTA) 100-25 MCG/INH AEPB Inhale 1 puff into the lungs daily. Patient not taking:  Reported on 09/08/2018 05/05/18   Angelita Ingles, MD  glipiZIDE (GLUCOTROL) 5 MG tablet Take 5 mg by mouth daily before breakfast.    [provider]  hydrALAZINE (APRESOLINE) 10 MG tablet Take 1 tablet (10 mg total) by mouth every 8 (eight) hours. Patient taking differently: Take 10 mg by mouth every 8 (eight) hours. For blood pressure 08/19/18   Laverda Page B, NP  HYDROcodone-acetaminophen (NORCO/VICODIN) 5-325 MG tablet Take 2 tablets by mouth every 4 (four) hours as needed. Patient taking differently: Take 1 tablet by mouth 2 (two) times daily as needed for moderate pain.  04/09/16   Isa Rankin, MD  ipratropium-albuterol (DUONEB) 0.5-2.5 (3) MG/3ML SOLN Take 3 mLs by nebulization every 4 (four) hours as needed. Patient taking differently: Take 3 mLs by nebulization every 4 (four) hours as needed (shortness of breath).  05/14/17   Pearson Grippe, MD  isosorbide dinitrate (ISORDIL) 20 MG tablet Take 20 mg by mouth 3 (three) times daily.    [provider]  ketoconazole (NIZORAL) 2 % shampoo Apply 1 application topically 2 (two) times a week.    [provider]  nitroGLYCERIN (NITROSTAT) 0.4 MG SL tablet Place 1 tablet (0.4 mg total) under the tongue every 5 (five) minutes x 3 doses as needed for chest pain. 08/19/18   Arty Baumgartner, NP  PRESCRIPTION MEDICATION Apply 1 application topically daily as needed (pain). ThermaGel    [provider]  rosuvastatin (CRESTOR) 20 MG tablet Take 1 tablet (20 mg total) by mouth daily at 6 PM. Patient not taking: Reported on 09/08/2018 05/04/18   Angelita Ingles, MD  senna-docusate (SENOKOT S) 8.6-50 MG tablet Take 1 tablet by mouth 2 (two) times daily. 09/10/18   Levora Dredge, MD  sertraline (ZOLOFT) 25 MG tablet Take 25-50 mg by mouth daily as needed (for anxiety).     [provider]  tamsulosin (FLOMAX) 0.4 MG CAPS capsule Take 0.4 mg by mouth at bedtime.    [provider]  torsemide (DEMADEX)  20 MG tablet Take one 20mg  tablet daily.  However, if weight up 3lbs in 24 hours take an extra dose that day.  If weight is below your dry weight skip your dose that day. Patient taking differently: Take 20 mg by mouth See admin instructions. Take one 20mg  tablet daily.  However, if weight up 3lbs in  24 hours take an extra dose that day.  If weight is below your dry weight skip your dose that day. 07/20/18   Barrett, Joline Salt, PA-C    Family History Family History  Problem Relation Age of Onset  . Heart disease Mother   . Heart Problems Maternal Grandmother   . Other Unknown        negative for premature CAD    Social History Social History   Tobacco Use  . Smoking status: Former Smoker    Packs/day: 1.00    Years: 15.00    Pack years: 15.00    Types: Cigarettes, Cigars  . Smokeless tobacco: Former Neurosurgeon  . Tobacco comment: quit smoking ~ 50 yr ago  Substance Use Topics  . Alcohol use: No    Alcohol/week: 2.0 standard drinks    Types: 2 Cans of beer per week    Comment: rare beer.  . Drug use: No     Allergies   Lisinopril and Xarelto [rivaroxaban]   Review of Systems Review of Systems  Constitutional: Negative for fever.  Respiratory: Positive for shortness of breath. Negative for cough.   Cardiovascular: Negative for chest pain.  Musculoskeletal: Negative for neck pain.  Neurological: Negative for headaches.  All other systems reviewed and are negative.    Physical Exam Updated Vital Signs BP 119/81 (BP Location: Left Arm)   Pulse 70   Temp 97.8 F (36.6 C) (Oral)   Resp (!) 24   Ht 5\' 7"  (1.702 m)   SpO2 91%   BMI 38.95 kg/m   Physical Exam  Constitutional: He is oriented to person, place, and time. He appears well-developed and well-nourished. No distress.  HENT:  Head: Normocephalic and atraumatic.  Mouth/Throat: Oropharynx is clear and moist.  Eyes: Pupils are equal, round, and reactive to light. Conjunctivae and EOM are normal.  Neck: Normal  range of motion. Neck supple.  Cardiovascular: Normal rate, regular rhythm and intact distal pulses.  No murmur heard. Pulmonary/Chest: Effort normal. Tachypnea noted. No respiratory distress. He has no wheezes. He has rales in the right lower field and the left lower field.  Abdominal: Soft. He exhibits distension. There is no tenderness. There is no rebound and no guarding.  Musculoskeletal: Normal range of motion. He exhibits no tenderness.       Right lower leg: He exhibits edema.       Left lower leg: He exhibits edema.  1+ pitting edema bilaterally  Neurological: He is alert and oriented to person, place, and time.  Skin: Skin is warm and dry. No rash noted. No erythema.  Psychiatric: He has a normal mood and affect. His behavior is normal.  Nursing note and vitals reviewed.    ED Treatments / Results  Labs (all labs ordered are listed, but only abnormal results are displayed) Labs Reviewed  CBC WITH DIFFERENTIAL/PLATELET - Abnormal; Notable for the following components:      Result Value   RBC 3.70 (*)    Hemoglobin 11.0 (*)    HCT 36.7 (*)    RDW 16.7 (*)    All other components within normal limits  COMPREHENSIVE METABOLIC PANEL - Abnormal; Notable for the following components:   Creatinine, Ser 1.77 (*)    Albumin 3.3 (*)    GFR calc non Af Amer 34 (*)    GFR calc Af Amer 39 (*)    All other components within normal limits  BRAIN NATRIURETIC PEPTIDE - Abnormal; Notable for the following components:  B Natriuretic Peptide 673.0 (*)    All other components within normal limits  I-STAT TROPONIN, ED    EKG EKG Interpretation  Date/Time:  Sunday September 17 2018 17:55:51 EDT Ventricular Rate:  70 PR Interval:    QRS Duration: 204 QT Interval:  522 QTC Calculation: 564 R Axis:   -78 Text Interpretation:  VENTRICULAR PACED RHYTHM Nonspecific IVCD with LAD LVH with secondary repolarization abnormality No significant change since last tracing Confirmed by Gwyneth Sprout ([id number]) on 09/17/2018 6:00:01 PM   Radiology Dg Chest Port 1 View  Result Date: 09/17/2018 CLINICAL DATA:  Shortness of breath. EXAM: PORTABLE CHEST 1 VIEW COMPARISON:  09/14/2018 FINDINGS: Stable cardiac pacemaker. Enlarged cardiac silhouette.  Mediastinal contours appear intact. There is no evidence of focal airspace consolidation, pleural effusion or pneumothorax. Osseous structures are without acute abnormality. Soft tissues are grossly normal. IMPRESSION: Enlarged cardiac silhouette. No evidence of pulmonary edema or consolidation. Electronically Signed   By: Ted Mcalpine M.D.   On: 09/17/2018 18:33    Procedures Procedures (including critical care time)  Medications Ordered in ED Medications - No data to display   Initial Impression / Assessment and Plan / ED Course  I have reviewed the triage vital signs and the nursing notes.  Pertinent labs & imaging results that were available during my care of the patient were reviewed by me and considered in my medical decision making (see chart for details).     Elderly gentleman with a significant history of heart disease with an EF between 25 and 30% presenting today with shortness of breath.  Patient does have rales distally in both lung fields and tachypnea on exam.  Oxygen saturation is 91% which improves with 2 L of oxygen.  Patient denies any weight changes and is still taking his medications.  He denies any infectious symptoms or chest pain concerning for ACS or pneumonia.  Concern for CHF exacerbation.  Low suspicion for PE.  However patient was also recently seen and diagnosed with anxiety. CBC, CMP, troponin, BNP,chest x-ray pending  7:51 PM Labs are all at baseline with a BNP in the 600s which seems to be where he normally is.  Troponin is negative.  EKG is unchanged with a paced rhythm.  Chest x-ray without signs of overt failure.  Renal function at baseline at 1.77.  Will give patient 1 dose of IV Lasix.   Discussed the findings with the patient.  Will ambulate to ensure no hypoxia but feel he will most likely be able to go home tonight.  9:31 PM Pt ambulated without difficulty.  He is ready for discharge home.  No desaturation noted with ambulating.  He will continue to take his normal medication and follow-up with his doctor as planned  Final Clinical Impressions(s) / ED Diagnoses   Final diagnoses:  Shortness of breath    ED Discharge Orders    None       Gwyneth Sprout, MD 09/17/18 2132

## 2018-09-17 NOTE — Discharge Instructions (Signed)
Continue taking your fluid pill as prescribed.  Avoid drinking excessive amounts of fluid or eating salt.

## 2018-09-17 NOTE — ED Notes (Signed)
ED Provider at bedside. 

## 2018-09-17 NOTE — ED Notes (Signed)
Pt ambulated with this RN and maintained at 93-94% on RA. Pt denied any increased difficulty breathing. Anitra Lauth, MD notified.

## 2018-09-17 NOTE — ED Triage Notes (Addendum)
Per [facility], pt c/o SOB and anxiety with hx of, but worsening today. Pt denies pain. Pt was 93% on RA  And 90% on RA after ambulating to EMS truck. Pt has hx of stents and heart failure. Pt is alert and oriented x4. Pt 91% on RA, pt placed on 2L [place]

## 2018-09-17 NOTE — ED Notes (Signed)
Patient verbalizes understanding of discharge instructions. Opportunity for questioning and answers were provided. Armband removed by staff, pt discharged from ED. Pt wheeled to lobby. 

## 2018-09-20 ENCOUNTER — Encounter: Payer: Self-pay | Admitting: Cardiovascular Disease

## 2018-09-20 ENCOUNTER — Ambulatory Visit (INDEPENDENT_AMBULATORY_CARE_PROVIDER_SITE_OTHER): Payer: No Typology Code available for payment source | Admitting: Cardiovascular Disease

## 2018-09-20 ENCOUNTER — Other Ambulatory Visit: Payer: Self-pay | Admitting: *Deleted

## 2018-09-20 VITALS — BP 111/61 | HR 72 | Ht 67.0 in | Wt 253.2 lb

## 2018-09-20 DIAGNOSIS — E7800 Pure hypercholesterolemia, unspecified: Secondary | ICD-10-CM

## 2018-09-20 DIAGNOSIS — Z5181 Encounter for therapeutic drug level monitoring: Secondary | ICD-10-CM | POA: Diagnosis not present

## 2018-09-20 DIAGNOSIS — R05 Cough: Secondary | ICD-10-CM

## 2018-09-20 DIAGNOSIS — R059 Cough, unspecified: Secondary | ICD-10-CM

## 2018-09-20 DIAGNOSIS — I10 Essential (primary) hypertension: Secondary | ICD-10-CM

## 2018-09-20 DIAGNOSIS — I2510 Atherosclerotic heart disease of native coronary artery without angina pectoris: Secondary | ICD-10-CM

## 2018-09-20 DIAGNOSIS — I480 Paroxysmal atrial fibrillation: Secondary | ICD-10-CM

## 2018-09-20 MED ORDER — TORSEMIDE 20 MG PO TABS: 20.0000 mg | ORAL_TABLET | ORAL | 3 refills | Status: DC

## 2018-09-20 MED ORDER — HYDROCOD POLST-CPM POLST ER 10-8 MG/5ML PO SUER: 5.0000 mL | Freq: Two times a day (BID) | ORAL | 0 refills | Status: DC | PRN

## 2018-09-20 MED ORDER — TORSEMIDE 20 MG PO TABS: ORAL_TABLET | ORAL | 3 refills | Status: DC

## 2018-09-20 NOTE — Patient Instructions (Addendum)
Medication Instructions:  INCREASE YOUR TORSEMIDE TO 40 MG DAILY   If you need a refill on your cardiac medications before your next appointment, please call your pharmacy.   Lab work: BMET IN 1 WEEK  If you have labs (blood work) drawn today and your tests are completely normal, you will receive your results only by: Marland Kitchen MyChart Message (if you have MyChart) OR . A paper copy in the mail If you have any lab test that is abnormal or we need to change your treatment, we will call you to review the results.  Testing/Procedures: NONE  Follow-Up: At [facility], you and your health needs are our priority.  As part of our continuing mission to provide you with exceptional heart care, we have created designated Provider Care Teams.  These Care Teams include your primary Cardiologist (physician) and Advanced Practice Providers (APPs -  Physician Assistants and Nurse Practitioners) who all work together to provide you with the care you need, when you need it. You will need a follow up appointment in 2 months.You may see [name], MD or one of the following Advanced Practice Providers on your designated Care Team:   Corine Shelter, PA-C Judy Pimple, [place] . Marjie Skiff, PA-C   WILL GET FASTING LABS WHEN YOU RETURN IN 2 MONTHS OR OK TO COME FEW DAYS PRIOR TO APPOINTMENT

## 2018-09-20 NOTE — Addendum Note (Signed)
Addended by: Regis Bill B on: 09/20/2018 11:32 AM   Modules accepted: Orders

## 2018-09-20 NOTE — Progress Notes (Signed)
Cardiology Office Note   Date:  09/20/2018   ID:  Ryan Macdonald, DOB [birth year], MRN [id number]  PCP:  [facility]  Cardiologist:   [name], MD   Chief Complaint  Patient presents with  . Follow-up  . Shortness of Breath    constantly      Patient ID: Ryan Macdonald is a 84 y.o. male with chronic systolic heart failure (LVEF 15%, nonischemic), NSVT, paroxysmal atrial fibrillation, prior PE, hypertension, hyperlipidemia, and CKD III who presents for follow on chronic systolic heart failure.  Ryan Macdonald was admitted to the hospital 07/2015 with a heart failure exacerbation in the setting of eating Chinese food and missing his evening dose of Lasix.   During that hospitalization he had an 11 beat run of NSVT.  At the time his magnesium level was 1.4.  Discharge weight was 266 lb.  He was also hospitalized for heart failure earlier that year at which time he was also intubated.  Ryan Macdonald had an episode of atrial fibrillation postoperatively in 2014.  He was initially on Xarelto but had hematuria, so it was discontinued.  Ryan Macdonald had a Biotronik ICD implanted at the [facility] on 10/2017.  Since I last saw him 11/2015 he was admitted to the hospital 07/2018 with CAD.  He had a catheter revealed 75% proximal LAD stenosis that was treated with a drug-eluting stent.  He had residual 50% left main and ostial left circumflex lesions.  He followed up with [name], DNP on 09/06/2018.  Ryan Macdonald has been struggling with volume overload.   He is avoiding [place] food and limiting his fluid intake.  He was seen in the ED 10/20 with volume overload.  He was feeling anxious and short of breath.  BNP was 673 and troponin was negative.  When this happens he takes an extra torsemide.  Sometimes it helps and sometimes it does not.  He reports that he has been seen in the emergency department approximately once per month with volume overload.  Since  having his PCI his breathing has been much better.  He has not had any chest pain or pressure.  He has no lower extremity edema, orthopnea, or PND.  His ICD is monitored at the [place].  He reports that his ICD fired once but it was an inappropriate therapy for atrial fibrillation.  He continues to have a cough from his recent upper respiratory infection.   Past Medical History:  Diagnosis Date  . Atrial fibrillation (HCC)   . Cardiomyopathy (HCC)   . Cat scratch fever    removed mass on right side neck  . Cataracts, bilateral   . CHF (congestive heart failure) (HCC)   . Chronic kidney disease   . Corns and callosities   . Diabetes mellitus without complication (HCC)    TYPE 2  . Dyspnea   . Erythema intertrigo   . Flat foot   . Gout   . High cholesterol   . Hx of urethral stricture   . Hypertension   . Kidney stones   . Morbid obesity (HCC)   . Nonischemic cardiomyopathy (HCC)    a. ? 2009 Cath in [place] - nl cors per pt;  b. 09/2013 Echo: EF 25-30%, sev diff HK.  . Obesity   . Osteoarthritis   . PAF (paroxysmal atrial fibrillation) (HCC)    a. post-op hip in 2014 - prev on xarelto.  . Renal insufficiency  Past Surgical History:  Procedure Laterality Date  . CARDIAC CATHETERIZATION  1980's  . CIRCUMCISION  1940's  . CORONARY STENT INTERVENTION N/A 08/18/2018   Procedure: CORONARY STENT INTERVENTION;  Surgeon: Corky Crafts, MD;  Location: [facility] INVASIVE CV LAB;  Service: Cardiovascular;  Laterality: N/A;  . CYSTOSCOPY WITH URETHRAL DILATATION  10/23/2013  . CYSTOSCOPY WITH URETHRAL DILATATION N/A 10/23/2013   Procedure: CYSTOSCOPY WITH URETHRAL DILATATION;  Surgeon: Kathi Ludwig, MD;  Location: [facility] OR;  Service: Urology;  Laterality: N/A;  . INCISION AND DRAINAGE OF WOUND Right 1980's   "cat scratch" (10/23/2013)  . INGUINAL HERNIA REPAIR Right 1950's  . INTRAVASCULAR ULTRASOUND/IVUS N/A 08/18/2018   Procedure: Intravascular Ultrasound/IVUS;  Surgeon: Corky Crafts, MD;  Location: [facility] INVASIVE CV LAB;  Service: Cardiovascular;  Laterality: N/A;  . KIDNEY STONE SURGERY Right 1970's; 1983   "twice"  . LEFT HEART CATH AND CORONARY ANGIOGRAPHY N/A 08/18/2018   Procedure: LEFT HEART CATH AND CORONARY ANGIOGRAPHY;  Surgeon: Corky Crafts, MD;  Location: [facility] INVASIVE CV LAB;  Service: Cardiovascular;  Laterality: N/A;  . TONSILLECTOMY AND ADENOIDECTOMY  1940's  . TOTAL HIP ARTHROPLASTY Left 10/23/2013   Procedure: TOTAL HIP ARTHROPLASTY;  Surgeon: Valeria Batman, MD;  Location: [facility] OR;  Service: Orthopedics;  Laterality: Left;     Current Outpatient Medications  Medication Sig Dispense Refill  . allopurinol (ZYLOPRIM) 100 MG tablet Take 200 mg by mouth daily. To prevent gout    . amiodarone (PACERONE) 200 MG tablet Take 1 tablet (200 mg total) by mouth 2 (two) times daily. (Patient taking differently: Take 200 mg by mouth 2 (two) times daily. For heart rhythm control) 60 tablet 0  . apixaban (ELIQUIS) 2.5 MG TABS tablet Take 1 tablet (2.5 mg total) by mouth 2 (two) times daily. (Patient taking differently: Take 2.5 mg by mouth 2 (two) times daily. Caution - blood thinner) 90 tablet 0  . benzonatate (TESSALON) 100 MG capsule Take 1 capsule (100 mg total) by mouth 3 (three) times daily as needed for cough. 60 capsule 0  . carvedilol (COREG) 12.5 MG tablet Take 0.5 tablets (6.25 mg total) by mouth 2 (two) times daily with a meal. (Patient taking differently: Take 12.5 mg by mouth 2 (two) times daily with a meal. ) 30 tablet 0  . chlorpheniramine-HYDROcodone (TUSSIONEX PENNKINETIC ER) 10-8 MG/5ML SUER Take 5 mLs by mouth every 12 (twelve) hours as needed for cough. 140 mL 0  . clopidogrel (PLAVIX) 75 MG tablet Take 1 tablet (75 mg total) by mouth daily with breakfast. 30 tablet 0  . cyanocobalamin 1000 MCG tablet Take 1,000 mcg by mouth daily.     . diclofenac sodium (VOLTAREN) 1 % GEL Apply 4 g topically 4 (four) times daily. (Patient taking  differently: Apply 4 g topically 4 (four) times daily as needed (pain). ) 1 Tube 0  . finasteride (PROSCAR) 5 MG tablet Take 5 mg by mouth daily.     . fluticasone furoate-vilanterol (BREO ELLIPTA) 100-25 MCG/INH AEPB Inhale 1 puff into the lungs daily. 60 each 3  . glipiZIDE (GLUCOTROL) 5 MG tablet Take 5 mg by mouth daily before breakfast.    . hydrALAZINE (APRESOLINE) 10 MG tablet Take 1 tablet (10 mg total) by mouth every 8 (eight) hours. (Patient taking differently: Take 10 mg by mouth every 8 (eight) hours. For blood pressure) 90 tablet 0  . HYDROcodone-acetaminophen (NORCO/VICODIN) 5-325 MG tablet Take 2 tablets by mouth every 4 (four) hours as needed. (  Patient taking differently: Take 1 tablet by mouth 2 (two) times daily as needed for moderate pain. ) 6 tablet 0  . ipratropium-albuterol (DUONEB) 0.5-2.5 (3) MG/3ML SOLN Take 3 mLs by nebulization every 4 (four) hours as needed. (Patient taking differently: Take 3 mLs by nebulization every 4 (four) hours as needed (shortness of breath). ) 360 mL 0  . isosorbide dinitrate (ISORDIL) 20 MG tablet Take 20 mg by mouth 3 (three) times daily.    Marland Kitchen ketoconazole (NIZORAL) 2 % shampoo Apply 1 application topically 2 (two) times a week.    . nitroGLYCERIN (NITROSTAT) 0.4 MG SL tablet Place 1 tablet (0.4 mg total) under the tongue every 5 (five) minutes x 3 doses as needed for chest pain. 25 tablet 0  . PRESCRIPTION MEDICATION Apply 1 application topically daily as needed (pain). ThermaGel    . rosuvastatin (CRESTOR) 20 MG tablet Take 1 tablet (20 mg total) by mouth daily at 6 PM. 90 tablet 2  . senna-docusate (SENOKOT S) 8.6-50 MG tablet Take 1 tablet by mouth 2 (two) times daily. 60 tablet 0  . sertraline (ZOLOFT) 25 MG tablet Take 25-50 mg by mouth daily as needed (for anxiety).     . tamsulosin (FLOMAX) 0.4 MG CAPS capsule Take 0.4 mg by mouth at bedtime.    . torsemide (DEMADEX) 20 MG tablet Take 20 mg by mouth as directed. Take 2 tablets by mouth  daily     No current facility-administered medications for this visit.     Allergies:   Lisinopril and Xarelto [rivaroxaban]    Social History:  The patient  reports that he has quit smoking. His smoking use included cigarettes and cigars. He has a 15.00 pack-year smoking history. He has quit using smokeless tobacco. He reports that he does not drink alcohol or use drugs.   Family History:  The patient's family history includes Heart Problems in his maternal grandmother; Heart disease in his mother; Other in his unknown relative.    ROS:  Please see the history of present illness.   Otherwise, review of systems are positive for none.   All other systems are reviewed and negative.    PHYSICAL EXAM: VS:  BP 111/61   Pulse 72   Ht 5\' 7"  (1.702 m)   Wt 253 lb 3.2 oz (114.9 kg)   BMI 39.66 kg/m  , BMI Body mass index is 39.66 kg/m. GENERAL:  Well appearing HEENT: Pupils equal round and reactive, fundi not visualized, oral mucosa unremarkable NECK:  No jugular venous distention, waveform within normal limits, carotid upstroke brisk and symmetric, no bruits LUNGS:  Clear to auscultation bilaterally HEART:  RRR.  PMI not displaced or sustained,S1 and S2 within normal limits, no S3, no S4, no clicks, no rubs, no murmurs ABD:  Flat, positive bowel sounds normal in frequency in pitch, no bruits, no rebound, no guarding, no midline pulsatile mass, no hepatomegaly, no splenomegaly EXT:  2 plus pulses throughout, no edema, no cyanosis no clubbing SKIN:  No rashes no nodules NEURO:  Cranial nerves II through XII grossly intact, motor grossly intact throughout PSYCH:  Cognitively intact, oriented to person place and time   EKG:  EKG is not ordered today.  Echocardiogram 04/30/2018 - Left ventricle: The cavity size was moderately dilated. Wall thickness was normal. Systolic function was severely reduced. The estimated ejection fraction was in the range of 20% to 25%. Diffuse hypokinesis.  Features are consistent with a pseudonormal left ventricular filling pattern, with concomitant abnormal  relaxation and increased filling pressure (grade 2 diastolic dysfunction). Doppler parameters are consistent with high ventricular filling pressure. - Aortic valve: There was trivial regurgitation. - Mitral valve: There was mild regurgitation. Valve area by pressure half-time: 1.09 cm^2. - Left atrium: The atrium was mildly dilated. - Right ventricle: Pacer wire or catheter noted in right ventricle. Systolic function was reduced.  Cardiac Cath 08/18/2018 Conclusion     Mid LM to Dist LM lesion is 40% stenosed. Cross sectional area 6.7 cm2.  Ost Cx lesion is 50% stenosed. Cross sectional area 5.5 cm2.  Mid LAD lesion is 50% stenosed.  LV end diastolic pressure is normal. LVEDP 12 mm Hg.  There is no aortic valve stenosis.  Prox LAD lesion is 75% stenosed. Severely calcified. Cross sectional area 3.2 and heavily calcified.  A drug-eluting stent was successfully placed using a STENT SYNERGY DES 3X12, postdilated to 3.3 mm.  Post intervention, there is a 0% residual stenosis.    Recommend to resume Apixaban, at currently prescribed dose and frequency, on 08/19/18. Recommend concurrent antiplatelet therapy of Aspirin 81mg  daily for 1 monthand Clopidogrel 75mg  daily for 6 months.    Recent Labs: 09/08/2018: Magnesium 1.8 09/17/2018: ALT 38; B Natriuretic Peptide 673.0; BUN 21; Creatinine, Ser 1.77; Hemoglobin 11.0; Platelets 229; Potassium 3.9; Sodium 138    Lipid Panel    Component Value Date/Time   CHOL 159 08/15/2018 0207   TRIG 89 08/15/2018 0207   HDL 32 (L) 08/15/2018 0207   CHOLHDL 5.0 08/15/2018 0207   VLDL 18 08/15/2018 0207   LDLCALC 109 (H) 08/15/2018 0207      Wt Readings from Last 3 Encounters:  09/20/18 253 lb 3.2 oz (114.9 kg)  09/10/18 248 lb 10.9 oz (112.8 kg)  09/08/18 253 lb 8.5 oz (115 kg)    Echo 08/22/15: Study  Conclusions  - Left ventricle: The cavity size was severely dilated. Systolic function was severely reduced. The estimated ejection fraction was in the range of 15-20%. Diffuse hypokinesis. - Aortic valve: There was trivial regurgitation. - Mitral valve: There was mild regurgitation. - Right ventricle: Systolic function was normal. - Right atrium: The atrium was normal in size. - Tricuspid valve: There was trivial regurgitation.   Other studies Reviewed: Additional studies/ records that were reviewed today include: . Review of the above records demonstrates:  Please see elsewhere in the note.     ASSESSMENT AND PLAN:  # Chronic systolic and diastolic heart failure: Ryan Macdonald is doing well now.  However he has had recurrent hospitalizations and ED visits for volume overload.  We discussed the possibility of either taking torsemide 40 mg when he has an exacerbation or just switching to 40 mill grams daily.  We will try 40 mg daily.  Check a basic metabolic panel in 1 week.  ICD is followed by the [place].  Continue carvedilol, hydralazine  # CAD s/p PCI: Ryan Macdonald had a drug-eluting stent placed 07/2018.  He is no longer on aspirin.  He will continue clopidogrel for 5 more months.  Continue carvedilol and rosuvastatin.    # Hyperlipidemia: Resume statin was started in the hospital.  He will get lipids and a CMP in 1 week.  Goal LDL is less than 70.  # ICD: No ICD firing.  He would like to have his device management her clinic once he is cleared by the [place].  # Paroxysmal atrial fibrillation: Hematuria on Xarelto.  He is doing well on Eliquis.  He has had recurrent  atrial fibrillation since his probe operative episode.  Continue carvedilol and amiodarone. This patients CHA2DS2-VASc Score and unadjusted Ischemic Stroke Rate (% per year) is equal to 7.2 % stroke rate/year from a score of 5  Above score calculated as 1 point each if present [CHF, HTN, DM, Vascular=MI/PAD/Aortic Plaque,  Age if 65-74, or Male] Above score calculated as 2 points each if present [Age > 75, or Stroke/TIA/TE]    Current medicines are reviewed at length with the patient today.  The patient does not have concerns regarding medicines.  The following changes have been made:  no change  Labs/ tests ordered today include:   Orders Placed This Encounter  Procedures  . Lipid panel  . Comprehensive metabolic panel  . Basic metabolic panel     Disposition:   FU with Lasean Rahming C. Duke Salvia, MD in 6 months.    Signed, [name], MD  09/20/2018 8:42 AM    [facility]

## 2018-09-22 ENCOUNTER — Other Ambulatory Visit: Payer: Self-pay

## 2018-09-22 ENCOUNTER — Encounter (HOSPITAL_COMMUNITY): Payer: Self-pay

## 2018-09-22 ENCOUNTER — Inpatient Hospital Stay (HOSPITAL_COMMUNITY)
Admission: EM | Admit: 2018-09-22 | Discharge: 2018-09-27 | DRG: 291 | Disposition: A | Discharge status: Home Health | Payer: Medicare Other | Attending: Internal Medicine | Admitting: Internal Medicine

## 2018-09-22 ENCOUNTER — Emergency Department (HOSPITAL_COMMUNITY): Payer: Medicare Other

## 2018-09-22 DIAGNOSIS — Z86711 Personal history of pulmonary embolism: Secondary | ICD-10-CM

## 2018-09-22 DIAGNOSIS — N183 Chronic kidney disease, stage 3 (moderate): Secondary | ICD-10-CM | POA: Diagnosis present

## 2018-09-22 DIAGNOSIS — I5043 Acute on chronic combined systolic (congestive) and diastolic (congestive) heart failure: Secondary | ICD-10-CM | POA: Diagnosis not present

## 2018-09-22 DIAGNOSIS — E1122 Type 2 diabetes mellitus with diabetic chronic kidney disease: Secondary | ICD-10-CM | POA: Diagnosis present

## 2018-09-22 DIAGNOSIS — Z7984 Long term (current) use of oral hypoglycemic drugs: Secondary | ICD-10-CM

## 2018-09-22 DIAGNOSIS — Z955 Presence of coronary angioplasty implant and graft: Secondary | ICD-10-CM

## 2018-09-22 DIAGNOSIS — I4891 Unspecified atrial fibrillation: Secondary | ICD-10-CM | POA: Diagnosis present

## 2018-09-22 DIAGNOSIS — Z95810 Presence of automatic (implantable) cardiac defibrillator: Secondary | ICD-10-CM

## 2018-09-22 DIAGNOSIS — E669 Obesity, unspecified: Secondary | ICD-10-CM

## 2018-09-22 DIAGNOSIS — E11649 Type 2 diabetes mellitus with hypoglycemia without coma: Secondary | ICD-10-CM | POA: Diagnosis present

## 2018-09-22 DIAGNOSIS — E785 Hyperlipidemia, unspecified: Secondary | ICD-10-CM | POA: Diagnosis present

## 2018-09-22 DIAGNOSIS — Z79899 Other long term (current) drug therapy: Secondary | ICD-10-CM

## 2018-09-22 DIAGNOSIS — I509 Heart failure, unspecified: Secondary | ICD-10-CM

## 2018-09-22 DIAGNOSIS — I428 Other cardiomyopathies: Secondary | ICD-10-CM | POA: Diagnosis present

## 2018-09-22 DIAGNOSIS — E7800 Pure hypercholesterolemia, unspecified: Secondary | ICD-10-CM | POA: Diagnosis present

## 2018-09-22 DIAGNOSIS — Z7902 Long term (current) use of antithrombotics/antiplatelets: Secondary | ICD-10-CM

## 2018-09-22 DIAGNOSIS — M109 Gout, unspecified: Secondary | ICD-10-CM | POA: Diagnosis present

## 2018-09-22 DIAGNOSIS — J439 Emphysema, unspecified: Secondary | ICD-10-CM | POA: Diagnosis present

## 2018-09-22 DIAGNOSIS — Z7901 Long term (current) use of anticoagulants: Secondary | ICD-10-CM

## 2018-09-22 DIAGNOSIS — Z6838 Body mass index (BMI) 38.0-38.9, adult: Secondary | ICD-10-CM

## 2018-09-22 DIAGNOSIS — I5023 Acute on chronic systolic (congestive) heart failure: Secondary | ICD-10-CM

## 2018-09-22 DIAGNOSIS — I130 Hypertensive heart and chronic kidney disease with heart failure and stage 1 through stage 4 chronic kidney disease, or unspecified chronic kidney disease: Principal | ICD-10-CM | POA: Diagnosis present

## 2018-09-22 DIAGNOSIS — J9611 Chronic respiratory failure with hypoxia: Secondary | ICD-10-CM

## 2018-09-22 DIAGNOSIS — I472 Ventricular tachycardia: Secondary | ICD-10-CM | POA: Diagnosis present

## 2018-09-22 DIAGNOSIS — I4729 Other ventricular tachycardia: Secondary | ICD-10-CM

## 2018-09-22 DIAGNOSIS — J441 Chronic obstructive pulmonary disease with (acute) exacerbation: Secondary | ICD-10-CM

## 2018-09-22 DIAGNOSIS — J9600 Acute respiratory failure, unspecified whether with hypoxia or hypercapnia: Secondary | ICD-10-CM | POA: Diagnosis present

## 2018-09-22 DIAGNOSIS — I10 Essential (primary) hypertension: Secondary | ICD-10-CM | POA: Diagnosis present

## 2018-09-22 DIAGNOSIS — I480 Paroxysmal atrial fibrillation: Secondary | ICD-10-CM | POA: Diagnosis present

## 2018-09-22 DIAGNOSIS — J9601 Acute respiratory failure with hypoxia: Secondary | ICD-10-CM | POA: Diagnosis present

## 2018-09-22 DIAGNOSIS — Z888 Allergy status to other drugs, medicaments and biological substances status: Secondary | ICD-10-CM

## 2018-09-22 DIAGNOSIS — I2510 Atherosclerotic heart disease of native coronary artery without angina pectoris: Secondary | ICD-10-CM | POA: Diagnosis present

## 2018-09-22 DIAGNOSIS — E1169 Type 2 diabetes mellitus with other specified complication: Secondary | ICD-10-CM | POA: Diagnosis present

## 2018-09-22 DIAGNOSIS — Z87891 Personal history of nicotine dependence: Secondary | ICD-10-CM

## 2018-09-22 DIAGNOSIS — Z96642 Presence of left artificial hip joint: Secondary | ICD-10-CM | POA: Diagnosis present

## 2018-09-22 DIAGNOSIS — I959 Hypotension, unspecified: Secondary | ICD-10-CM | POA: Diagnosis present

## 2018-09-22 LAB — I-STAT TROPONIN, ED: Troponin i, poc: 0.05 ng/mL (ref 0.00–0.08)

## 2018-09-22 LAB — BASIC METABOLIC PANEL
Anion gap: 9 (ref 5–15)
BUN: 16 mg/dL (ref 8–23)
CHLORIDE: 101 mmol/L (ref 98–111)
CO2: 29 mmol/L (ref 22–32)
CREATININE: 1.61 mg/dL — AB (ref 0.61–1.24)
Calcium: 8.8 mg/dL — ABNORMAL LOW (ref 8.9–10.3)
GFR calc non Af Amer: 38 mL/min — ABNORMAL LOW (ref >60)
GFR, EST AFRICAN AMERICAN: 44 mL/min — AB (ref >60)
GLUCOSE: 101 mg/dL — AB (ref 70–99)
Potassium: 4.3 mmol/L (ref 3.5–5.1)
Sodium: 139 mmol/L (ref 135–145)

## 2018-09-22 LAB — CBC WITH DIFFERENTIAL/PLATELET
Abs Immature Granulocytes: 0.03 10*3/uL (ref 0.00–0.07)
BASOS ABS: 0 10*3/uL (ref 0.0–0.1)
Basophils Relative: 0 %
Eosinophils Absolute: 0.1 10*3/uL (ref 0.0–0.5)
Eosinophils Relative: 2 %
HEMATOCRIT: 34 % — AB (ref 39.0–52.0)
HEMOGLOBIN: 10.2 g/dL — AB (ref 13.0–17.0)
IMMATURE GRANULOCYTES: 0 %
LYMPHS ABS: 0.9 10*3/uL (ref 0.7–4.0)
LYMPHS PCT: 12 %
MCH: 29.3 pg (ref 26.0–34.0)
MCHC: 30 g/dL (ref 30.0–36.0)
MCV: 97.7 fL (ref 80.0–100.0)
Monocytes Absolute: 0.7 10*3/uL (ref 0.1–1.0)
Monocytes Relative: 10 %
NRBC: 0 % (ref 0.0–0.2)
Neutro Abs: 5.2 10*3/uL (ref 1.7–7.7)
Neutrophils Relative %: 76 %
Platelets: 222 10*3/uL (ref 150–400)
RBC: 3.48 MIL/uL — ABNORMAL LOW (ref 4.22–5.81)
RDW: 16.2 % — ABNORMAL HIGH (ref 11.5–15.5)
WBC: 6.9 10*3/uL (ref 4.0–10.5)

## 2018-09-22 LAB — I-STAT ARTERIAL BLOOD GAS, ED
Acid-Base Excess: 2 mmol/L (ref 0.0–2.0)
Bicarbonate: 26.7 mmol/L (ref 20.0–28.0)
O2 Saturation: 97 %
PH ART: 7.407 (ref 7.350–7.450)
TCO2: 28 mmol/L (ref 22–32)
pCO2 arterial: 42.2 mmHg (ref 32.0–48.0)
pO2, Arterial: 90 mmHg (ref 83.0–108.0)

## 2018-09-22 LAB — BRAIN NATRIURETIC PEPTIDE: B NATRIURETIC PEPTIDE 5: 1184.7 pg/mL — AB (ref 0.0–100.0)

## 2018-09-22 LAB — GLUCOSE, CAPILLARY: Glucose-Capillary: 84 mg/dL (ref 70–99)

## 2018-09-22 LAB — TROPONIN I: TROPONIN I: 0.03 ng/mL — AB (ref <0.03)

## 2018-09-22 MED ORDER — DICLOFENAC SODIUM 1 % TD GEL
4.0000 g | Freq: Four times a day (QID) | TRANSDERMAL | Status: DC | PRN
  Filled 2018-09-22: qty 100

## 2018-09-22 MED ORDER — ONDANSETRON HCL 4 MG/2ML IJ SOLN: 4.0000 mg | Freq: Four times a day (QID) | INTRAMUSCULAR | Status: DC | PRN

## 2018-09-22 MED ORDER — ACETAMINOPHEN 325 MG PO TABS
650.0000 mg | ORAL_TABLET | Freq: Four times a day (QID) | ORAL | Status: DC | PRN
  Administered 2018-09-23: 650 mg via ORAL
  Filled 2018-09-22: qty 2

## 2018-09-22 MED ORDER — TAMSULOSIN HCL 0.4 MG PO CAPS
0.4000 mg | ORAL_CAPSULE | Freq: Every day | ORAL | Status: DC
  Administered 2018-09-23 – 2018-09-27 (×5): 0.4 mg via ORAL
  Filled 2018-09-22 (×5): qty 1

## 2018-09-22 MED ORDER — BENZONATATE 100 MG PO CAPS
100.0000 mg | ORAL_CAPSULE | Freq: Three times a day (TID) | ORAL | Status: DC | PRN
  Administered 2018-09-22 – 2018-09-27 (×7): 100 mg via ORAL
  Filled 2018-09-22 (×7): qty 1

## 2018-09-22 MED ORDER — KETOCONAZOLE 2 % EX SHAM
1.0000 "application " | MEDICATED_SHAMPOO | CUTANEOUS | Status: DC
  Administered 2018-09-25: 1 via TOPICAL
  Filled 2018-09-22: qty 120

## 2018-09-22 MED ORDER — VITAMIN B-12 1000 MCG PO TABS
1000.0000 ug | ORAL_TABLET | Freq: Every day | ORAL | Status: DC
  Administered 2018-09-23 – 2018-09-27 (×5): 1000 ug via ORAL
  Filled 2018-09-22 (×5): qty 1

## 2018-09-22 MED ORDER — ACETAMINOPHEN 650 MG RE SUPP: 650.0000 mg | Freq: Four times a day (QID) | RECTAL | Status: DC | PRN

## 2018-09-22 MED ORDER — AMIODARONE HCL 200 MG PO TABS
200.0000 mg | ORAL_TABLET | Freq: Two times a day (BID) | ORAL | Status: DC
  Administered 2018-09-22 – 2018-09-27 (×10): 200 mg via ORAL
  Filled 2018-09-22 (×10): qty 1

## 2018-09-22 MED ORDER — APIXABAN 2.5 MG PO TABS
2.5000 mg | ORAL_TABLET | Freq: Two times a day (BID) | ORAL | Status: DC
  Administered 2018-09-22 – 2018-09-27 (×10): 2.5 mg via ORAL
  Filled 2018-09-22 (×10): qty 1

## 2018-09-22 MED ORDER — HYDROCODONE-ACETAMINOPHEN 5-325 MG PO TABS
1.0000 | ORAL_TABLET | Freq: Two times a day (BID) | ORAL | Status: DC | PRN
  Administered 2018-09-24 – 2018-09-26 (×6): 1 via ORAL
  Filled 2018-09-22 (×7): qty 1

## 2018-09-22 MED ORDER — IPRATROPIUM-ALBUTEROL 0.5-2.5 (3) MG/3ML IN SOLN
3.0000 mL | RESPIRATORY_TRACT | Status: DC | PRN
  Administered 2018-09-25: 3 mL via RESPIRATORY_TRACT
  Filled 2018-09-22: qty 3

## 2018-09-22 MED ORDER — GLIPIZIDE 5 MG PO TABS
5.0000 mg | ORAL_TABLET | Freq: Every day | ORAL | Status: DC
  Administered 2018-09-23 – 2018-09-24 (×2): 5 mg via ORAL
  Filled 2018-09-22 (×2): qty 1

## 2018-09-22 MED ORDER — ISOSORBIDE DINITRATE 20 MG PO TABS
20.0000 mg | ORAL_TABLET | Freq: Three times a day (TID) | ORAL | Status: DC
  Administered 2018-09-22 – 2018-09-23 (×4): 20 mg via ORAL
  Filled 2018-09-22 (×5): qty 1

## 2018-09-22 MED ORDER — ONDANSETRON HCL 4 MG PO TABS: 4.0000 mg | ORAL_TABLET | Freq: Four times a day (QID) | ORAL | Status: DC | PRN

## 2018-09-22 MED ORDER — ALLOPURINOL 100 MG PO TABS
200.0000 mg | ORAL_TABLET | Freq: Every day | ORAL | Status: DC
  Administered 2018-09-23 – 2018-09-27 (×5): 200 mg via ORAL
  Filled 2018-09-22 (×5): qty 2

## 2018-09-22 MED ORDER — FUROSEMIDE 10 MG/ML IJ SOLN
40.0000 mg | Freq: Two times a day (BID) | INTRAMUSCULAR | Status: DC
  Administered 2018-09-22 – 2018-09-23 (×3): 40 mg via INTRAVENOUS
  Filled 2018-09-22 (×3): qty 4

## 2018-09-22 MED ORDER — FUROSEMIDE 10 MG/ML IJ SOLN
40.0000 mg | Freq: Once | INTRAMUSCULAR | Status: AC
  Administered 2018-09-22: 40 mg via INTRAVENOUS
  Filled 2018-09-22: qty 4

## 2018-09-22 MED ORDER — CLOPIDOGREL BISULFATE 75 MG PO TABS
75.0000 mg | ORAL_TABLET | Freq: Every day | ORAL | Status: DC
  Administered 2018-09-23 – 2018-09-27 (×5): 75 mg via ORAL
  Filled 2018-09-22 (×5): qty 1

## 2018-09-22 MED ORDER — HYDROCOD POLST-CPM POLST ER 10-8 MG/5ML PO SUER
5.0000 mL | ORAL | Status: AC
  Administered 2018-09-22: 5 mL via ORAL
  Filled 2018-09-22: qty 5

## 2018-09-22 MED ORDER — FINASTERIDE 5 MG PO TABS
5.0000 mg | ORAL_TABLET | Freq: Every day | ORAL | Status: DC
  Administered 2018-09-23 – 2018-09-27 (×5): 5 mg via ORAL
  Filled 2018-09-22 (×5): qty 1

## 2018-09-22 MED ORDER — CARVEDILOL 12.5 MG PO TABS
12.5000 mg | ORAL_TABLET | Freq: Two times a day (BID) | ORAL | Status: DC
  Administered 2018-09-23 – 2018-09-27 (×9): 12.5 mg via ORAL
  Filled 2018-09-22 (×9): qty 1

## 2018-09-22 MED ORDER — HYDRALAZINE HCL 10 MG PO TABS
10.0000 mg | ORAL_TABLET | Freq: Three times a day (TID) | ORAL | Status: DC
  Administered 2018-09-22 – 2018-09-24 (×5): 10 mg via ORAL
  Filled 2018-09-22 (×5): qty 1

## 2018-09-22 MED ORDER — SENNOSIDES-DOCUSATE SODIUM 8.6-50 MG PO TABS
1.0000 | ORAL_TABLET | Freq: Two times a day (BID) | ORAL | Status: DC
  Administered 2018-09-22 – 2018-09-27 (×10): 1 via ORAL
  Filled 2018-09-22 (×10): qty 1

## 2018-09-22 MED ORDER — HYDROCOD POLST-CPM POLST ER 10-8 MG/5ML PO SUER
5.0000 mL | Freq: Two times a day (BID) | ORAL | Status: DC | PRN
  Administered 2018-09-22 – 2018-09-23 (×3): 5 mL via ORAL
  Filled 2018-09-22 (×3): qty 5

## 2018-09-22 NOTE — ED Triage Notes (Signed)
Pt brought in by EMS due to having SOB. Pt was told by [facility] cardiologist to come to hospital due oxygen level being 86% on room air and SBP was in the 80's. Pt a&ox4.

## 2018-09-22 NOTE — H&P (Signed)
History and Physical    Ryan Macdonald ZOX:[id number] DOB: Mar 10, 1934 DOA: 09/22/2018  PCP: [facility]   Patient coming from: Home.  I have personally briefly reviewed patient's old medical records in [facility]  Chief Complaint: Shortness of breath.  HPI: Ryan Macdonald is a 84 y.o. male with medical history significant of atrial fibrillation, unspecified cardiomyopathy, cat scratch fever, bilateral cataracts, chronic kidney disease, type 2 diabetes, gout, hyperlipidemia, history of ureteral stricture, hypertension, urolithiasis, morbid obesity, nonischemic cardiomyopathy, osteoarthritis, paroxysmal atrial fibrillation, chronic combined systolic and diastolic heart failure who is coming to the emergency department due to being called by the [facility] after they remotely read his SBP as being 80 mm per mercury.  He was told to call 911 immediately.  However, he states that his blood pressure readings at home were normal.  When EMS arrived though they found his O2 sats in the 80s.  He denies worsening dyspnea or lower extremity edema, since he states this is his baseline for the past 5 years.  However he states that his torsemide was increased recently due to persistent edema.  He denies fever, chills, but feels frequently fatigued.  Denies chest pain, palpitations, dizziness, diaphoresis, PND, abdominal pain, nausea, emesis, diarrhea, constipation, melena or hematochezia.  No dysuria frequency or hematuria.  Denies polyuria, polydipsia, polyphagia or blurred vision.  ED Course: Initial vital signs temperature 97.8 F, pulse 70, respirations 24, blood pressure 116/71 mmHg and O2 sat 100% on room air.  Patient received 40 mg of furosemide in the emergency department.  ABG was normal.  Troponin was normal.  BNP was at dialysis and 184.7 pg/mL.  EKG showed ventricular paced rhythm.  White count 6.9, hemoglobin 10.2 g platelets 222.  Electrolytes were normal, except for calcium of  8.8 mg/dL, no albumin yet available.  Glucose 101, BUN 16 creatinine 1.61 mg/dL.  His chest radiograph showed stable cardiomegaly with low lung volumes, but no acute pulmonary disease.  Review of Systems: As per HPI otherwise 10 point review of systems negative.   Past Medical History:  Diagnosis Date  . Atrial fibrillation (HCC)   . Cardiomyopathy (HCC)   . Cat scratch fever    removed mass on right side neck  . Cataracts, bilateral   . CHF (congestive heart failure) (HCC)   . Chronic kidney disease   . Corns and callosities   . Diabetes mellitus without complication (HCC)    TYPE 2  . Dyspnea   . Erythema intertrigo   . Flat foot   . Gout   . High cholesterol   . Hx of urethral stricture   . Hypertension   . Kidney stones   . Morbid obesity (HCC)   . Nonischemic cardiomyopathy (HCC)    a. ? 2009 Cath in [place] - nl cors per pt;  b. 09/2013 Echo: EF 25-30%, sev diff HK.  . Obesity   . Osteoarthritis   . PAF (paroxysmal atrial fibrillation) (HCC)    a. post-op hip in 2014 - prev on xarelto.  . Renal insufficiency     Past Surgical History:  Procedure Laterality Date  . CARDIAC CATHETERIZATION  1980's  . CIRCUMCISION  1940's  . CORONARY STENT INTERVENTION N/A 08/18/2018   Procedure: CORONARY STENT INTERVENTION;  Surgeon: Corky Crafts, MD;  Location: [facility] INVASIVE CV LAB;  Service: Cardiovascular;  Laterality: N/A;  . CYSTOSCOPY WITH URETHRAL DILATATION  10/23/2013  . CYSTOSCOPY WITH URETHRAL DILATATION N/A 10/23/2013   Procedure: CYSTOSCOPY WITH  URETHRAL DILATATION;  Surgeon: Kathi Ludwig, MD;  Location: [facility] OR;  Service: Urology;  Laterality: N/A;  . INCISION AND DRAINAGE OF WOUND Right 1980's   "cat scratch" (10/23/2013)  . INGUINAL HERNIA REPAIR Right 1950's  . INTRAVASCULAR ULTRASOUND/IVUS N/A 08/18/2018   Procedure: Intravascular Ultrasound/IVUS;  Surgeon: Corky Crafts, MD;  Location: [facility] INVASIVE CV LAB;  Service: Cardiovascular;  Laterality: N/A;  .  KIDNEY STONE SURGERY Right 1970's; 1983   "twice"  . LEFT HEART CATH AND CORONARY ANGIOGRAPHY N/A 08/18/2018   Procedure: LEFT HEART CATH AND CORONARY ANGIOGRAPHY;  Surgeon: Corky Crafts, MD;  Location: [facility] INVASIVE CV LAB;  Service: Cardiovascular;  Laterality: N/A;  . TONSILLECTOMY AND ADENOIDECTOMY  1940's  . TOTAL HIP ARTHROPLASTY Left 10/23/2013   Procedure: TOTAL HIP ARTHROPLASTY;  Surgeon: Valeria Batman, MD;  Location: [facility] OR;  Service: Orthopedics;  Laterality: Left;     reports that he has quit smoking. His smoking use included cigarettes and cigars. He has a 15.00 pack-year smoking history. He has quit using smokeless tobacco. He reports that he does not drink alcohol or use drugs.  Allergies  Allergen Reactions  . Lisinopril Swelling    Facial swelling  . Xarelto [Rivaroxaban] Other (See Comments)    "Blood came out of my penis"    Family History  Problem Relation Age of Onset  . Heart disease Mother   . Heart Problems Maternal Grandmother   . Other Unknown        negative for premature CAD   Prior to Admission medications   Medication Sig Start Date End Date Taking? Authorizing Provider  allopurinol (ZYLOPRIM) 100 MG tablet Take 200 mg by mouth daily. To prevent gout    [provider]  amiodarone (PACERONE) 200 MG tablet Take 1 tablet (200 mg total) by mouth 2 (two) times daily. Patient taking differently: Take 200 mg by mouth 2 (two) times daily. For heart rhythm control 02/16/18   Burna Cash, MD  apixaban (ELIQUIS) 2.5 MG TABS tablet Take 1 tablet (2.5 mg total) by mouth 2 (two) times daily. Patient taking differently: Take 2.5 mg by mouth 2 (two) times daily. Caution - blood thinner 05/04/18   Angelita Ingles, MD  benzonatate (TESSALON) 100 MG capsule Take 1 capsule (100 mg total) by mouth 3 (three) times daily as needed for cough. 09/06/18   Jodelle Gross, NP  carvedilol (COREG) 12.5 MG tablet Take 0.5 tablets (6.25 mg total) by  mouth 2 (two) times daily with a meal. Patient taking differently: Take 12.5 mg by mouth 2 (two) times daily with a meal.  04/15/17   Osvaldo Shipper, MD  chlorpheniramine-HYDROcodone [facility] PENNKINETIC ER) 10-8 MG/5ML SUER Take 5 mLs by mouth every 12 (twelve) hours as needed for cough. 09/20/18   [name], MD  clopidogrel (PLAVIX) 75 MG tablet Take 1 tablet (75 mg total) by mouth daily with breakfast. 08/20/18   Laverda Page B, NP  cyanocobalamin 1000 MCG tablet Take 1,000 mcg by mouth daily.     [provider]  diclofenac sodium (VOLTAREN) 1 % GEL Apply 4 g topically 4 (four) times daily. Patient taking differently: Apply 4 g topically 4 (four) times daily as needed (pain).  07/10/17   Valentino Nose, MD  finasteride (PROSCAR) 5 MG tablet Take 5 mg by mouth daily.     [provider]  fluticasone furoate-vilanterol (BREO ELLIPTA) 100-25 MCG/INH AEPB Inhale 1 puff into the lungs daily. 05/05/18   Chana Bode  B, MD  glipiZIDE (GLUCOTROL) 5 MG tablet Take 5 mg by mouth daily before breakfast.    [provider]  hydrALAZINE (APRESOLINE) 10 MG tablet Take 1 tablet (10 mg total) by mouth every 8 (eight) hours. Patient taking differently: Take 10 mg by mouth every 8 (eight) hours. For blood pressure 08/19/18   Laverda Page B, NP  HYDROcodone-acetaminophen (NORCO/VICODIN) 5-325 MG tablet Take 2 tablets by mouth every 4 (four) hours as needed. Patient taking differently: Take 1 tablet by mouth 2 (two) times daily as needed for moderate pain.  04/09/16   Isa Rankin, MD  ipratropium-albuterol (DUONEB) 0.5-2.5 (3) MG/3ML SOLN Take 3 mLs by nebulization every 4 (four) hours as needed. Patient taking differently: Take 3 mLs by nebulization every 4 (four) hours as needed (shortness of breath).  05/14/17   Pearson Grippe, MD  isosorbide dinitrate (ISORDIL) 20 MG tablet Take 20 mg by mouth 3 (three) times daily.    [provider]  ketoconazole (NIZORAL) 2 %  shampoo Apply 1 application topically 2 (two) times a week.    [provider]  nitroGLYCERIN (NITROSTAT) 0.4 MG SL tablet Place 1 tablet (0.4 mg total) under the tongue every 5 (five) minutes x 3 doses as needed for chest pain. 08/19/18   Arty Baumgartner, NP  PRESCRIPTION MEDICATION Apply 1 application topically daily as needed (pain). ThermaGel    [provider]  rosuvastatin (CRESTOR) 20 MG tablet Take 1 tablet (20 mg total) by mouth daily at 6 PM. 05/04/18   Winfrey, Kimberlee Nearing, MD  senna-docusate (SENOKOT S) 8.6-50 MG tablet Take 1 tablet by mouth 2 (two) times daily. 09/10/18   Levora Dredge, MD  sertraline (ZOLOFT) 25 MG tablet Take 25-50 mg by mouth daily as needed (for anxiety).     [provider]  tamsulosin (FLOMAX) 0.4 MG CAPS capsule Take 0.4 mg by mouth at bedtime.    [provider]  torsemide (DEMADEX) 20 MG tablet Take 2 tablets by mouth daily 09/20/18   [name], MD    Physical Exam: Vitals:   09/22/18 1230 09/22/18 1300 09/22/18 1407 09/22/18 1415  BP: 114/65 (!) 114/57 102/65 103/65  Pulse: 70 71 70 69  Resp: (!) 26 (!) 25 (!) 28 (!) 27  Temp:      TempSrc:      SpO2: 100% 100% 100% 98%  Weight:      Height:        Constitutional: NAD, calm, comfortable Eyes: PERRL, lids and conjunctivae normal ENMT: Mucous membranes are moist. Posterior pharynx clear of any exudate or lesions. Neck: Normal, supple, no masses, no thyromegaly Respiratory: Mildly tachypneic at 23 breaths/min.  Decreased breath sounds on bases with bibasilar rales.  No crackles. No accessory muscle use.  Cardiovascular: Regular rate and rhythm, no murmurs / rubs / gallops.  2+ lower extremities pitting edema. 2+ pedal pulses. No carotid bruits.  Abdomen: Obese, soft, no tenderness, no masses palpated. No hepatosplenomegaly. Bowel sounds positive.  Musculoskeletal: no clubbing / cyanosis.  Good ROM, no contractures. Normal muscle tone.  Skin: no rashes,  lesions, ulcers. No induration on very limited dermatological examination. Neurologic: CN 2-12 grossly intact. Sensation intact, DTR normal. Strength 5/5 in all 4.  Psychiatric: Normal judgment and insight. Alert and oriented x 3. Normal mood.   Labs on Admission: I have personally reviewed following labs and imaging studies  CBC: Recent Labs  Lab 09/17/18 1758 09/22/18 1216  WBC 8.2 6.9  NEUTROABS 5.9 5.2  HGB 11.0* 10.2*  HCT 36.7* 34.0*  MCV 99.2 97.7  PLT 229 222   Basic Metabolic Panel: Recent Labs  Lab 09/17/18 1758 09/22/18 1216  NA 138 139  K 3.9 4.3  CL 103 101  CO2 28 29  GLUCOSE 99 101*  BUN 21 16  CREATININE 1.77* 1.61*  CALCIUM 9.0 8.8*   GFR: Estimated Creatinine Clearance: 42.8 mL/min (A) (by C-G formula based on SCr of 1.61 mg/dL (H)). Liver Function Tests: Recent Labs  Lab 09/17/18 1758  AST 41  ALT 38  ALKPHOS 67  BILITOT 0.8  PROT 8.0  ALBUMIN 3.3*   No results for input(s): LIPASE, AMYLASE in the last 168 hours. No results for input(s): AMMONIA in the last 168 hours. Coagulation Profile: No results for input(s): INR, PROTIME in the last 168 hours. Cardiac Enzymes: No results for input(s): CKTOTAL, CKMB, CKMBINDEX, TROPONINI in the last 168 hours. BNP (last 3 results) No results for input(s): PROBNP in the last 8760 hours. HbA1C: No results for input(s): HGBA1C in the last 72 hours. CBG: No results for input(s): GLUCAP in the last 168 hours. Lipid Profile: No results for input(s): CHOL, HDL, LDLCALC, TRIG, CHOLHDL, LDLDIRECT in the last 72 hours. Thyroid Function Tests: No results for input(s): TSH, T4TOTAL, FREET4, T3FREE, THYROIDAB in the last 72 hours. Anemia Panel: No results for input(s): VITAMINB12, FOLATE, FERRITIN, TIBC, IRON, RETICCTPCT in the last 72 hours. Urine analysis:    Component Value Date/Time   COLORURINE YELLOW 09/08/2018 0646   APPEARANCEUR CLOUDY (A) 09/08/2018 0646   LABSPEC 1.009 09/08/2018 0646   PHURINE  5.0 09/08/2018 0646   GLUCOSEU NEGATIVE 09/08/2018 0646   HGBUR LARGE (A) 09/08/2018 0646   BILIRUBINUR NEGATIVE 09/08/2018 0646   KETONESUR NEGATIVE 09/08/2018 0646   PROTEINUR NEGATIVE 09/08/2018 0646   UROBILINOGEN 0.2 05/26/2015 1521   NITRITE POSITIVE (A) 09/08/2018 0646   LEUKOCYTESUR LARGE (A) 09/08/2018 0646    Radiological Exams on Admission: Dg Chest 2 View  Result Date: 09/22/2018 CLINICAL DATA:  Shortness of breath. EXAM: CHEST - 2 VIEW COMPARISON:  09/17/2018 FINDINGS: AICD noted with tip in stable position. Cardiomegaly with normal pulmonary vascularity. Heart size unchanged. Low lung volumes. No acute pulmonary infiltrate. No pleural effusion or pneumothorax. Degenerative changes scoliosis thoracic spine. IMPRESSION: 1.  AICD in stable position.  Stable cardiomegaly. 2.  Low lung volumes.  No acute pulmonary disease. Electronically Signed   By: Maisie Fus  Register   On: 09/22/2018 12:20   04/30/2018 echocardiogram complete ------------------------------------------------------------------- LV EF: 20% -   25%  ------------------------------------------------------------------- History:   PMH:  Acute Respiratory Failure with hypercapnea, Hypoxia, Shortness of Breath, Non-Ischemic Cardiomyopathy, Obesity, Pulmonary Embolism, NSVT, Chronic Renal Insufficiency, Diabetes II, Increased Troponins. Congestive Heart Failure.  ------------------------------------------------------------------- Study Conclusions  - Left ventricle: The cavity size was moderately dilated. Wall   thickness was normal. Systolic function was severely reduced. The   estimated ejection fraction was in the range of 20% to 25%.   Diffuse hypokinesis. Features are consistent with a pseudonormal   left ventricular filling pattern, with concomitant abnormal   relaxation and increased filling pressure (grade 2 diastolic   dysfunction). Doppler parameters are consistent with high   ventricular filling  pressure. - Aortic valve: There was trivial regurgitation. - Mitral valve: There was mild regurgitation. Valve area by   pressure half-time: 1.09 cm^2. - Left atrium: The atrium was mildly dilated. - Right ventricle: Pacer wire or catheter noted in right ventricle.   Systolic function was reduced.  08/18/2018 left heart cath and coronary angiography   Mid LM to Dist LM lesion is 40% stenosed. Cross sectional area 6.7 cm2.  Ost Cx lesion is 50% stenosed. Cross sectional area 5.5 cm2.  Mid LAD lesion is 50% stenosed.  LV end diastolic pressure is normal. LVEDP 12 mm Hg.  There is no aortic valve stenosis.  Prox LAD lesion is 75% stenosed. Severely calcified. Cross sectional area 3.2 and heavily calcified.  A drug-eluting stent was successfully placed using a STENT SYNERGY DES 3X12, postdilated to 3.3 mm.  Post intervention, there is a 0% residual stenosis.   Recommend to resume Apixaban, at currently prescribed dose and frequency, on 08/19/18.  Recommend concurrent antiplatelet therapy of Aspirin 81mg  daily for 1 month and Clopidogrel 75mg  daily for 6 months.    EKG: Independently reviewed.  Vent. rate 70 BPM PR interval * ms QRS duration 187 ms QT/QTc 505/545 ms P-R-T axes 219 -85 102 Ventricular paced rhythm No further analysis attempted due to paced rhythm  Assessment/Plan Principal Problem:   Acute on chronic combined systolic and diastolic heart failure (HCC) Observation/telemetry. Continue supplemental oxygen. Fluid restriction. Daily weights. Monitor intake and output. Furosemide 40 mg IVP twice daily. Allergic to ACE/ARB. Supplement potassium. Follow-up renal function electrolytes.  Active Problems:   Essential hypertension Carvedilol 12.5 mg p.o. twice daily. Her blood pressure.    Hyperlipidemia No longer taking Crestor.    Atrial fibrillation (HCC) CHA?DS?-VASc Score of at least X. Continue Eliquis 2.5 mg p.o. twice daily. Continue amiodarone 200  mg p.o. twice daily. Continue carvedilol 12.5 mg p.o. twice daily.    NSVT (nonsustained ventricular tachycardia) (HCC) Continue amiodarone and beta-blocker.    Diabetes mellitus type 2 in obese (HCC) Carbohydrate modified diet. Continue glipizide 5 mg p.o. daily before breakfast. CBG monitoring with regular insulin sliding scale in the hospital.    DVT prophylaxis: On apixaban. Code Status: Full code. Family Communication: His wife was present in the ED room. Disposition Plan: Observation for 24 to 48 hours for CHF exacerbation treatment. Consults called: Admission status: Observation/telemetry.   Bobette Mo MD [facility] Pager [phone].  If 7PM-7AM, please contact night-coverage [email or web address] Password [email or web address]  09/22/2018, 2:32 PM

## 2018-09-22 NOTE — ED Provider Notes (Signed)
Medical screening examination/treatment/procedure(s) were conducted as a shared visit with non-physician practitioner(s) and myself.  I personally evaluated the patient during the encounter.  Clinical Impression:   Final diagnoses:  Acute on chronic congestive heart failure, unspecified heart failure type [facility])    The patient is a 84 year old male, he has a known history of congestive heart failure, was hypoxic this morning and told by the [facility] hospital team that follows his fluid status to come to the hospital because of his hypoxia and fluid retention.  He denies specifically gaining weight but states that today he has had increased amounts of coughing up frothy white sputum.  On my exam the patient does have some scattered rhonchi but is able to speak in full sentences, he is mildly tachypneic and coughs frequently.  He has bilateral symmetrical pitting edema to the legs, he has no obvious JVD but the patient is morbidly obese and it is difficult to tell.  He has a very soft heart murmur but no arrhythmias of concern.  He has a pacemaker in the left upper chest which is nontender and nonerythematous.  EKG shows paced rhythm, chest x-ray, labs reviewed and appreciated, BNP is elevated consistent with worsening heart failure.  The patient does have a history of requiring BiPAP in the past per additional history obtained from prior providers evaluation and medical record.  Patient will need to be admitted to the hospital for IV diuresis.   EKG Interpretation  Date/Time:  Friday September 22 2018 11:34:19 EDT Ventricular Rate:  70 PR Interval:    QRS Duration: 187 QT Interval:  505 QTC Calculation: 545 R Axis:   -85 Text Interpretation:  Ventricular-paced rhythm No further analysis attempted due to paced rhythm since last tracing no significant change Confirmed by Eber Hong ([id number]) on 09/22/2018 1:16:45 PM         Eber Hong, MD 09/23/18 [phone]

## 2018-09-22 NOTE — ED Provider Notes (Signed)
[name] EMERGENCY DEPARTMENT Provider Note   CSN: [id number] Arrival date & time: 09/22/18  1102     History   Chief Complaint Chief Complaint  Patient presents with  . Shortness of Breath    HPI Ryan Macdonald is a very pleasant 84 y.o. male with an extensive past medical history including A. fib, CHF, chronic kidney disease, type 2 diabetes, hypertension obesity who presents the emergency department for shortness of breath. Patient has excellent insight into his disease and is a very good historian.  Patient states that today he was not feeling very well and feeling a bit more short of breath than normal.  He weighs himself daily and has very good insight into his disease.  His weight, blood pressure and oxygen saturations are also monitored electronically by the [facility] system.  The patient states that his weight is down some per his scale after being transitioned to twice a day torsemide from once a day.  He got a call from the [place] system because his blood pressure was read on their system as being a systolic pressure of 80 and he was told to call 911 immediately.  The patient says that the blood pressure reading at his house was normal.  He was however found to have low oxygen saturations in the 80s by EMS and the patient is not normally oxygen dependent.  He denies chest pain.  He states that the swelling in his legs is at baseline.  He says that his urinary output has not increased significantly after his increase in the torsemide dose.  He denies feeling of fever, chills, abdominal pain, nausea, vomiting, diaphoresis, urinary symptoms.  Patient is currently taking both Plavix and Eliquis and states that he is compliant with his medications.   HPI  Past Medical History:  Diagnosis Date  . Atrial fibrillation (HCC)   . Cardiomyopathy (HCC)   . Cat scratch fever    removed mass on right side neck  . Cataracts, bilateral   . CHF (congestive heart failure) (HCC)   .  Chronic kidney disease   . Corns and callosities   . Diabetes mellitus without complication (HCC)    TYPE 2  . Dyspnea   . Erythema intertrigo   . Flat foot   . Gout   . High cholesterol   . Hx of urethral stricture   . Hypertension   . Kidney stones   . Morbid obesity (HCC)   . Nonischemic cardiomyopathy (HCC)    a. ? 2009 Cath in [place] - nl cors per pt;  b. 09/2013 Echo: EF 25-30%, sev diff HK.  . Obesity   . Osteoarthritis   . PAF (paroxysmal atrial fibrillation) (HCC)    a. post-op hip in 2014 - prev on xarelto.  . Renal insufficiency     Patient Active Problem List   Diagnosis Date Noted  . Morbid obesity (HCC) 09/09/2018  . URI (upper respiratory infection) 08/31/2018  . UTI (urinary tract infection) 08/31/2018  . Acute on chronic systolic (congestive) heart failure (HCC) 08/28/2018  . CHF (congestive heart failure) (HCC) 08/14/2018  . Acute on chronic combined systolic and diastolic congestive heart failure (HCC) 07/18/2018  . First degree AV block   . Shortness of breath 04/29/2018  . Acute HFrEF (heart failure with reduced ejection fraction) (HCC) 04/29/2018  . Acute respiratory failure with hypoxia (HCC) 09/27/2017  . CHF exacerbation (HCC) 09/27/2017  . Renal insufficiency   . Acute on chronic systolic CHF (congestive  heart failure) (HCC) 07/08/2017  . Left knee pain 07/08/2017  . Acute bronchitis 05/10/2017  . Acute congestive heart failure (HCC) 04/14/2017  . Elevated troponin 04/14/2017  . Lactic acidosis   . Acute on chronic systolic heart failure, NYHA class 2 (HCC) 02/03/2017  . Diabetes mellitus type 2 in obese (HCC) 02/03/2017  . CAP (community acquired pneumonia) 02/03/2017  . Lobar pneumonia (HCC)   . Diabetes mellitus with complication (HCC)   . Acute on chronic respiratory failure with hypoxia (HCC)   . Acute on chronic combined systolic and diastolic heart failure (HCC) 05/19/2016  . Depression 05/19/2016  . Gout 05/19/2016  . ICD (implantable  cardioverter-defibrillator) in place 12/17/2015  . NSVT (nonsustained ventricular tachycardia) (HCC)   . Chronic renal insufficiency, stage 3 (moderate) (HCC)   . Pulmonary embolism (HCC)   . Acute respiratory failure (HCC)   . Osteoarthritis of left hip 10/25/2013  . Atrial fibrillation (HCC) 10/25/2013  . S/P hip replacement 10/24/2013  . Nonischemic cardiomyopathy (HCC)   . Obesity (BMI 30-39.9)   . Essential hypertension   . Hyperlipidemia     Past Surgical History:  Procedure Laterality Date  . CARDIAC CATHETERIZATION  1980's  . CIRCUMCISION  1940's  . CORONARY STENT INTERVENTION N/A 08/18/2018   Procedure: CORONARY STENT INTERVENTION;  Surgeon: Corky Crafts, MD;  Location: [facility] INVASIVE CV LAB;  Service: Cardiovascular;  Laterality: N/A;  . CYSTOSCOPY WITH URETHRAL DILATATION  10/23/2013  . CYSTOSCOPY WITH URETHRAL DILATATION N/A 10/23/2013   Procedure: CYSTOSCOPY WITH URETHRAL DILATATION;  Surgeon: Kathi Ludwig, MD;  Location: [facility] OR;  Service: Urology;  Laterality: N/A;  . INCISION AND DRAINAGE OF WOUND Right 1980's   "cat scratch" (10/23/2013)  . INGUINAL HERNIA REPAIR Right 1950's  . INTRAVASCULAR ULTRASOUND/IVUS N/A 08/18/2018   Procedure: Intravascular Ultrasound/IVUS;  Surgeon: Corky Crafts, MD;  Location: [facility] INVASIVE CV LAB;  Service: Cardiovascular;  Laterality: N/A;  . KIDNEY STONE SURGERY Right 1970's; 1983   "twice"  . LEFT HEART CATH AND CORONARY ANGIOGRAPHY N/A 08/18/2018   Procedure: LEFT HEART CATH AND CORONARY ANGIOGRAPHY;  Surgeon: Corky Crafts, MD;  Location: [facility] INVASIVE CV LAB;  Service: Cardiovascular;  Laterality: N/A;  . TONSILLECTOMY AND ADENOIDECTOMY  1940's  . TOTAL HIP ARTHROPLASTY Left 10/23/2013   Procedure: TOTAL HIP ARTHROPLASTY;  Surgeon: Valeria Batman, MD;  Location: [facility] OR;  Service: Orthopedics;  Laterality: Left;        Home Medications    Prior to Admission medications   Medication Sig Start Date End  Date Taking? Authorizing Provider  allopurinol (ZYLOPRIM) 100 MG tablet Take 200 mg by mouth daily. To prevent gout    [provider]  amiodarone (PACERONE) 200 MG tablet Take 1 tablet (200 mg total) by mouth 2 (two) times daily. Patient taking differently: Take 200 mg by mouth 2 (two) times daily. For heart rhythm control 02/16/18   Burna Cash, MD  apixaban (ELIQUIS) 2.5 MG TABS tablet Take 1 tablet (2.5 mg total) by mouth 2 (two) times daily. Patient taking differently: Take 2.5 mg by mouth 2 (two) times daily. Caution - blood thinner 05/04/18   Angelita Ingles, MD  benzonatate (TESSALON) 100 MG capsule Take 1 capsule (100 mg total) by mouth 3 (three) times daily as needed for cough. 09/06/18   Jodelle Gross, NP  carvedilol (COREG) 12.5 MG tablet Take 0.5 tablets (6.25 mg total) by mouth 2 (two) times daily with a meal. Patient taking differently: Take 12.5  mg by mouth 2 (two) times daily with a meal.  04/15/17   Osvaldo Shipper, MD  chlorpheniramine-HYDROcodone [facility]) 10-8 MG/5ML SUER Take 5 mLs by mouth every 12 (twelve) hours as needed for cough. 09/20/18   [name], MD  clopidogrel (PLAVIX) 75 MG tablet Take 1 tablet (75 mg total) by mouth daily with breakfast. 08/20/18   Laverda Page B, NP  cyanocobalamin 1000 MCG tablet Take 1,000 mcg by mouth daily.     [provider]  diclofenac sodium (VOLTAREN) 1 % GEL Apply 4 g topically 4 (four) times daily. Patient taking differently: Apply 4 g topically 4 (four) times daily as needed (pain).  07/10/17   Valentino Nose, MD  finasteride (PROSCAR) 5 MG tablet Take 5 mg by mouth daily.     [provider]  fluticasone furoate-vilanterol (BREO ELLIPTA) 100-25 MCG/INH AEPB Inhale 1 puff into the lungs daily. 05/05/18   Angelita Ingles, MD  glipiZIDE (GLUCOTROL) 5 MG tablet Take 5 mg by mouth daily before breakfast.    [provider]  hydrALAZINE (APRESOLINE) 10 MG  tablet Take 1 tablet (10 mg total) by mouth every 8 (eight) hours. Patient taking differently: Take 10 mg by mouth every 8 (eight) hours. For blood pressure 08/19/18   Laverda Page B, NP  HYDROcodone-acetaminophen (NORCO/VICODIN) 5-325 MG tablet Take 2 tablets by mouth every 4 (four) hours as needed. Patient taking differently: Take 1 tablet by mouth 2 (two) times daily as needed for moderate pain.  04/09/16   Isa Rankin, MD  ipratropium-albuterol (DUONEB) 0.5-2.5 (3) MG/3ML SOLN Take 3 mLs by nebulization every 4 (four) hours as needed. Patient taking differently: Take 3 mLs by nebulization every 4 (four) hours as needed (shortness of breath).  05/14/17   Pearson Grippe, MD  isosorbide dinitrate (ISORDIL) 20 MG tablet Take 20 mg by mouth 3 (three) times daily.    [provider]  ketoconazole (NIZORAL) 2 % shampoo Apply 1 application topically 2 (two) times a week.    [provider]  nitroGLYCERIN (NITROSTAT) 0.4 MG SL tablet Place 1 tablet (0.4 mg total) under the tongue every 5 (five) minutes x 3 doses as needed for chest pain. 08/19/18   Arty Baumgartner, NP  PRESCRIPTION MEDICATION Apply 1 application topically daily as needed (pain). ThermaGel    [provider]  rosuvastatin (CRESTOR) 20 MG tablet Take 1 tablet (20 mg total) by mouth daily at 6 PM. 05/04/18   Winfrey, Kimberlee Nearing, MD  senna-docusate (SENOKOT S) 8.6-50 MG tablet Take 1 tablet by mouth 2 (two) times daily. 09/10/18   Levora Dredge, MD  sertraline (ZOLOFT) 25 MG tablet Take 25-50 mg by mouth daily as needed (for anxiety).     [provider]  tamsulosin (FLOMAX) 0.4 MG CAPS capsule Take 0.4 mg by mouth at bedtime.    [provider]  torsemide (DEMADEX) 20 MG tablet Take 2 tablets by mouth daily 09/20/18   [name], MD    Family History Family History  Problem Relation Age of Onset  . Heart disease Mother   . Heart Problems Maternal Grandmother   . Other Unknown         negative for premature CAD    Social History Social History   Tobacco Use  . Smoking status: Former Smoker    Packs/day: 1.00    Years: 15.00    Pack years: 15.00    Types: Cigarettes, Cigars  . Smokeless tobacco: Former Neurosurgeon  .  Tobacco comment: quit smoking ~ 50 yr ago  Substance Use Topics  . Alcohol use: No    Alcohol/week: 2.0 standard drinks    Types: 2 Cans of beer per week    Comment: rare beer.  . Drug use: No     Allergies   Lisinopril and Xarelto [rivaroxaban]   Review of Systems Review of Systems Ten systems reviewed and are negative for acute change, except as noted in the HPI.    Physical Exam Updated Vital Signs Temp 97.8 F (36.6 C) (Oral)   Ht 5\' 7"  (1.702 m)   Wt 114.8 kg   BMI 39.64 kg/m   Physical Exam  Constitutional: He appears well-developed and well-nourished. No distress.  HENT:  Head: Normocephalic and atraumatic.  Eyes: Conjunctivae are normal. No scleral icterus.  Neck: Normal range of motion. Neck supple.  Cardiovascular: Normal rate, regular rhythm and normal heart sounds.  Pulmonary/Chest: Tachypnea noted. He has rales in the right lower field and the left lower field.  Patient breathing is moderately labored, he is tachypneic.  Nasal cannula in place with oxygen saturations above 90% on 2 L  Abdominal: Soft. There is no tenderness.  Musculoskeletal:       Right lower leg: He exhibits edema.       Left lower leg: He exhibits edema.  2+ pitting edema in the bilateral lower extremities  Neurological: He is alert.  Skin: Skin is warm and dry. He is not diaphoretic.  Psychiatric: His behavior is normal.  Nursing note and vitals reviewed.     ED Treatments / Results  Labs (all labs ordered are listed, but only abnormal results are displayed) Labs Reviewed - No data to display  EKG EKG Interpretation  Date/Time:  Friday September 22 2018 11:34:19 EDT Ventricular Rate:  70 PR Interval:    QRS Duration: 187 QT  Interval:  505 QTC Calculation: 545 R Axis:   -85 Text Interpretation:  Ventricular-paced rhythm No further analysis attempted due to paced rhythm since last tracing no significant change Confirmed by Eber Hong ([id number]) on 09/22/2018 1:16:45 PM   Radiology No results found.  Procedures Procedures (including critical care time)  Medications Ordered in ED Medications - No data to display   Initial Impression / Assessment and Plan / ED Course  I have reviewed the triage vital signs and the nursing notes.  Pertinent labs & imaging results that were available during my care of the patient were reviewed by me and considered in my medical decision making (see chart for details).     84 year old male with a history of fragile CHF.The emergent differential diagnosis for shortness of breath includes, but is not limited to, Pulmonary edema, bronchoconstriction, Pneumonia, Pulmonary embolism, Pneumotherax/ Hemothorax, Dysrythmia, ACS.  Clinically appears volume overloaded with labored breathing, peripheral edema and hypoxia.  His lab results were reviewed by me and show elevating BNP consistent with acute CHF exacerbation.  he has a normal blood gas.  His hemoglobin is slightly low.  Creatinine is elevated but baseline for the patient there is no evidence of acute renal insufficiency.  I personally reviewed the patient's PA and lateral chest film however I feel the patient does have pulmonary edema.  The EKG is reviewed by me and shows a paced rhythm without acute ischemic change.  Patient will be admitted by the hospitalist service.  He is stable throughout his ER visit here. Final Clinical Impressions(s) / ED Diagnoses   Final diagnoses:  None    ED  Discharge Orders    None       Arthor Captain, PA-C 09/22/18 1635    Eber Hong, MD 09/23/18 [phone]

## 2018-09-23 DIAGNOSIS — J9601 Acute respiratory failure with hypoxia: Secondary | ICD-10-CM | POA: Diagnosis not present

## 2018-09-23 DIAGNOSIS — I5043 Acute on chronic combined systolic (congestive) and diastolic (congestive) heart failure: Secondary | ICD-10-CM | POA: Diagnosis not present

## 2018-09-23 DIAGNOSIS — E669 Obesity, unspecified: Secondary | ICD-10-CM

## 2018-09-23 DIAGNOSIS — I10 Essential (primary) hypertension: Secondary | ICD-10-CM

## 2018-09-23 DIAGNOSIS — E1169 Type 2 diabetes mellitus with other specified complication: Secondary | ICD-10-CM | POA: Diagnosis not present

## 2018-09-23 LAB — CBC
HCT: 31.8 % — ABNORMAL LOW (ref 39.0–52.0)
Hemoglobin: 9.5 g/dL — ABNORMAL LOW (ref 13.0–17.0)
MCH: 29 pg (ref 26.0–34.0)
MCHC: 29.9 g/dL — ABNORMAL LOW (ref 30.0–36.0)
MCV: 97 fL (ref 80.0–100.0)
NRBC: 0 % (ref 0.0–0.2)
PLATELETS: 212 10*3/uL (ref 150–400)
RBC: 3.28 MIL/uL — ABNORMAL LOW (ref 4.22–5.81)
RDW: 15.9 % — AB (ref 11.5–15.5)
WBC: 7.1 10*3/uL (ref 4.0–10.5)

## 2018-09-23 LAB — BASIC METABOLIC PANEL
ANION GAP: 9 (ref 5–15)
BUN: 17 mg/dL (ref 8–23)
CO2: 29 mmol/L (ref 22–32)
Calcium: 8.6 mg/dL — ABNORMAL LOW (ref 8.9–10.3)
Chloride: 100 mmol/L (ref 98–111)
Creatinine, Ser: 1.7 mg/dL — ABNORMAL HIGH (ref 0.61–1.24)
GFR calc Af Amer: 41 mL/min — ABNORMAL LOW (ref >60)
GFR calc non Af Amer: 36 mL/min — ABNORMAL LOW (ref >60)
GLUCOSE: 85 mg/dL (ref 70–99)
POTASSIUM: 4.2 mmol/L (ref 3.5–5.1)
Sodium: 138 mmol/L (ref 135–145)

## 2018-09-23 LAB — GLUCOSE, CAPILLARY
GLUCOSE-CAPILLARY: 68 mg/dL — AB (ref 70–99)
GLUCOSE-CAPILLARY: 65 mg/dL — AB (ref 70–99)
Glucose-Capillary: 101 mg/dL — ABNORMAL HIGH (ref 70–99)
Glucose-Capillary: 129 mg/dL — ABNORMAL HIGH (ref 70–99)
Glucose-Capillary: 104 mg/dL — ABNORMAL HIGH (ref 70–99)

## 2018-09-23 LAB — TROPONIN I: Troponin I: 0.03 ng/mL (ref <0.03)

## 2018-09-23 MED ORDER — INSULIN ASPART 100 UNIT/ML ~~LOC~~ SOLN: 0.0000 [IU] | Freq: Three times a day (TID) | SUBCUTANEOUS | Status: DC

## 2018-09-23 MED ORDER — INSULIN ASPART 100 UNIT/ML ~~LOC~~ SOLN
0.0000 [IU] | Freq: Three times a day (TID) | SUBCUTANEOUS | Status: DC
  Administered 2018-09-24 – 2018-09-25 (×3): 1 [IU] via SUBCUTANEOUS
  Administered 2018-09-26: 2 [IU] via SUBCUTANEOUS
  Administered 2018-09-26: 1 [IU] via SUBCUTANEOUS

## 2018-09-23 MED ORDER — INSULIN ASPART 100 UNIT/ML ~~LOC~~ SOLN: 0.0000 [IU] | Freq: Every day | SUBCUTANEOUS | Status: DC

## 2018-09-23 NOTE — Progress Notes (Signed)
Provider paged with text post of troponin level

## 2018-09-23 NOTE — Progress Notes (Addendum)
Progress Note    Ryan Macdonald  WUJ:[id number] DOB: [birth year]  DOA: 09/22/2018 PCP: [facility]    Brief Narrative:    Medical records reviewed and are as summarized below:  Ryan Macdonald is an 84 y.o. male with medical history significant of atrial fibrillation, unspecified cardiomyopathy, cat scratch fever, bilateral cataracts, chronic kidney disease, type 2 diabetes, gout, hyperlipidemia, history of ureteral stricture, hypertension, urolithiasis, morbid obesity, nonischemic cardiomyopathy, osteoarthritis, paroxysmal atrial fibrillation, chronic combined systolic and diastolic heart failure who is coming to the emergency department due to being called by the [facility] after they remotely read his SBP as being 80 mm per mercury.  He was told to call 911 immediately.  However, he states that his blood pressure readings at home were normal.  When EMS arrived though they found his O2 sats in the 80s.  Assessment/Plan:   Principal Problem:   Acute on chronic combined systolic and diastolic heart failure (HCC) Active Problems:   Essential hypertension   Hyperlipidemia   Atrial fibrillation (HCC)   Acute respiratory failure (HCC)   NSVT (nonsustained ventricular tachycardia) (HCC)   Diabetes mellitus type 2 in obese (HCC)  Acute respiratory failure -wean O2 as tolerated -does not wear O2 at home -on RA O2 sat dropped to low 80 at rest and into 70s when talking  Acute on chronic combined systolic and diastolic heart failure (HCC) Daily weights. Monitor intake and output. Furosemide 40 mg IVP twice daily. -denies missing diuretic and 2 days ago his diuretic was increased by Dr. Duke Salvia from 20 mg to 40 mg -denies dietary indiscretions Allergic to ACE/ARB. -daily labs while diuresing -this is the 11th ER visit/hospitalization in 6 months -BNP markedly different from 1 week ago 6.73---> 1184    Essential hypertension Carvedilol 12.5 mg p.o. twice  daily. -imdur/hydralazine    Hyperlipidemia No longer taking Crestor.    Atrial fibrillation (HCC) Continue Eliquis 2.5 mg p.o. twice daily. Continue amiodarone 200 mg p.o. twice daily. Continue carvedilol 12.5 mg p.o. twice daily.    h/o NSVT (nonsustained ventricular tachycardia) (HCC) Continue amiodarone and beta-blocker.  CKD stage III -monitor closely with diuresis    Diabetes mellitus type 2 in obese (HCC) Carbohydrate modified diet. Continue glipizide 5 mg p.o. daily before breakfast. -SSI  obesity Body mass index is 38.02 kg/m.   Family Communication/Anticipated D/C date and plan/Code Status   DVT prophylaxis: eliquis Code Status: Full Code.  Family Communication:  Disposition Plan: change to inpt -- spoke with Dr. Mikey Bussing, IMTS to take over in the AM as he was just d/c'd   Medical Consultants:    None.    Subjective:   Says he has been taking his medications and has not eaten anything he should not have  Objective:    Vitals:   09/23/18 0500 09/23/18 0645 09/23/18 0656 09/23/18 0800  BP:  114/73 114/73   Pulse:   75   Resp:   16   Temp:   99.2 F (37.3 C)   TempSrc:   Oral   SpO2:   98% 90%  Weight: 110.1 kg     Height:        Intake/Output Summary (Last 24 hours) at 09/23/2018 1104 Last data filed at 09/23/2018 0600 Gross per 24 hour  Intake 350 ml  Output 550 ml  Net -200 ml   Filed Weights   09/22/18 1109 09/23/18 0500  Weight: 114.8 kg 110.1 kg    Exam: In bed, dry cough  B/l rales with +LE edema, mind increased work of breathing On 2LO2 A+OX3, pleasant and cooperative   Data Reviewed:   I have personally reviewed following labs and imaging studies:  Labs: Labs show the following:   Basic Metabolic Panel: Recent Labs  Lab 09/17/18 1758 09/22/18 1216 09/23/18 0059  NA 138 139 138  K 3.9 4.3 4.2  CL 103 101 100  CO2 28 29 29   GLUCOSE 99 101* 85  BUN 21 16 17   CREATININE 1.77* 1.61* 1.70*  CALCIUM 9.0  8.8* 8.6*   GFR Estimated Creatinine Clearance: 39.7 mL/min (A) (by C-G formula based on SCr of 1.7 mg/dL (H)). Liver Function Tests: Recent Labs  Lab 09/17/18 1758  AST 41  ALT 38  ALKPHOS 67  BILITOT 0.8  PROT 8.0  ALBUMIN 3.3*   No results for input(s): LIPASE, AMYLASE in the last 168 hours. No results for input(s): AMMONIA in the last 168 hours. Coagulation profile No results for input(s): INR, PROTIME in the last 168 hours.  CBC: Recent Labs  Lab 09/17/18 1758 09/22/18 1216 09/23/18 0059  WBC 8.2 6.9 7.1  NEUTROABS 5.9 5.2  --   HGB 11.0* 10.2* 9.5*  HCT 36.7* 34.0* 31.8*  MCV 99.2 97.7 97.0  PLT 229 222 212   Cardiac Enzymes: Recent Labs  Lab 09/22/18 1835 09/23/18 0059  TROPONINI 0.03* 0.03*   BNP (last 3 results) No results for input(s): PROBNP in the last 8760 hours. CBG: Recent Labs  Lab 09/22/18 2144 09/23/18 0751  GLUCAP 84 101*   D-Dimer: No results for input(s): DDIMER in the last 72 hours. Hgb A1c: No results for input(s): HGBA1C in the last 72 hours. Lipid Profile: No results for input(s): CHOL, HDL, LDLCALC, TRIG, CHOLHDL, LDLDIRECT in the last 72 hours. Thyroid function studies: No results for input(s): TSH, T4TOTAL, T3FREE, THYROIDAB in the last 72 hours.  Invalid input(s): FREET3 Anemia work up: No results for input(s): VITAMINB12, FOLATE, FERRITIN, TIBC, IRON, RETICCTPCT in the last 72 hours. Sepsis Labs: Recent Labs  Lab 09/17/18 1758 09/22/18 1216 09/23/18 0059  WBC 8.2 6.9 7.1    Microbiology No results found for this or any previous visit (from the past 240 hour(s)).  Procedures and diagnostic studies:  Dg Chest 2 View  Result Date: 09/22/2018 CLINICAL DATA:  Shortness of breath. EXAM: CHEST - 2 VIEW COMPARISON:  09/17/2018 FINDINGS: AICD noted with tip in stable position. Cardiomegaly with normal pulmonary vascularity. Heart size unchanged. Low lung volumes. No acute pulmonary infiltrate. No pleural effusion or  pneumothorax. Degenerative changes scoliosis thoracic spine. IMPRESSION: 1.  AICD in stable position.  Stable cardiomegaly. 2.  Low lung volumes.  No acute pulmonary disease. Electronically Signed   By: Maisie Fus  Register   On: 09/22/2018 12:20    Medications:   . allopurinol  200 mg Oral Daily  . amiodarone  200 mg Oral BID  . apixaban  2.5 mg Oral BID  . carvedilol  12.5 mg Oral BID WC  . clopidogrel  75 mg Oral Q breakfast  . finasteride  5 mg Oral Daily  . furosemide  40 mg Intravenous BID  . glipiZIDE  5 mg Oral QAC breakfast  . hydrALAZINE  10 mg Oral Q8H  . insulin aspart  0-5 Units Subcutaneous QHS  . insulin aspart  0-9 Units Subcutaneous TID WC  . isosorbide dinitrate  20 mg Oral TID  . [START ON 09/25/2018] ketoconazole  1 application Topical Once per day on Mon Thu  .  senna-docusate  1 tablet Oral BID  . tamsulosin  0.4 mg Oral Daily  . cyanocobalamin  1,000 mcg Oral Daily   Continuous Infusions:   LOS: 0 days   Joseph Art  [facility]   *Please refer to [email or web address], password [email or web address] to get updated schedule on who will round on this patient, as hospitalists switch teams weekly. If 7PM-7AM, please contact night-coverage at [email or web address], password [email or web address] for any overnight needs.  09/23/2018, 11:04 AM

## 2018-09-23 NOTE — Plan of Care (Signed)
Pt alert, oriented, and in no apparent distress at this time. Progressing positively.

## 2018-09-23 NOTE — Discharge Planning (Signed)
EDCM spoke with pt at bedside regarding home health services.  Pt is currently active with [facility] home health that has been set up through his [employer] Benefits.  Pt with resume services with [facility] at this time.  No further EDCM needs at this time.

## 2018-09-24 DIAGNOSIS — E1122 Type 2 diabetes mellitus with diabetic chronic kidney disease: Secondary | ICD-10-CM | POA: Diagnosis present

## 2018-09-24 DIAGNOSIS — I130 Hypertensive heart and chronic kidney disease with heart failure and stage 1 through stage 4 chronic kidney disease, or unspecified chronic kidney disease: Secondary | ICD-10-CM | POA: Diagnosis present

## 2018-09-24 DIAGNOSIS — M109 Gout, unspecified: Secondary | ICD-10-CM | POA: Diagnosis present

## 2018-09-24 DIAGNOSIS — I2510 Atherosclerotic heart disease of native coronary artery without angina pectoris: Secondary | ICD-10-CM | POA: Diagnosis present

## 2018-09-24 DIAGNOSIS — E785 Hyperlipidemia, unspecified: Secondary | ICD-10-CM

## 2018-09-24 DIAGNOSIS — Z87891 Personal history of nicotine dependence: Secondary | ICD-10-CM | POA: Diagnosis not present

## 2018-09-24 DIAGNOSIS — Z955 Presence of coronary angioplasty implant and graft: Secondary | ICD-10-CM | POA: Diagnosis not present

## 2018-09-24 DIAGNOSIS — Z95810 Presence of automatic (implantable) cardiac defibrillator: Secondary | ICD-10-CM

## 2018-09-24 DIAGNOSIS — J439 Emphysema, unspecified: Secondary | ICD-10-CM | POA: Diagnosis present

## 2018-09-24 DIAGNOSIS — I472 Ventricular tachycardia: Secondary | ICD-10-CM

## 2018-09-24 DIAGNOSIS — I480 Paroxysmal atrial fibrillation: Secondary | ICD-10-CM

## 2018-09-24 DIAGNOSIS — Z6838 Body mass index (BMI) 38.0-38.9, adult: Secondary | ICD-10-CM | POA: Diagnosis not present

## 2018-09-24 DIAGNOSIS — Z7901 Long term (current) use of anticoagulants: Secondary | ICD-10-CM | POA: Diagnosis not present

## 2018-09-24 DIAGNOSIS — I5043 Acute on chronic combined systolic (congestive) and diastolic (congestive) heart failure: Secondary | ICD-10-CM | POA: Diagnosis present

## 2018-09-24 DIAGNOSIS — E11649 Type 2 diabetes mellitus with hypoglycemia without coma: Secondary | ICD-10-CM | POA: Diagnosis present

## 2018-09-24 DIAGNOSIS — Z79899 Other long term (current) drug therapy: Secondary | ICD-10-CM

## 2018-09-24 DIAGNOSIS — Z96642 Presence of left artificial hip joint: Secondary | ICD-10-CM | POA: Diagnosis present

## 2018-09-24 DIAGNOSIS — Z7902 Long term (current) use of antithrombotics/antiplatelets: Secondary | ICD-10-CM | POA: Diagnosis not present

## 2018-09-24 DIAGNOSIS — I959 Hypotension, unspecified: Secondary | ICD-10-CM | POA: Diagnosis present

## 2018-09-24 DIAGNOSIS — I428 Other cardiomyopathies: Secondary | ICD-10-CM | POA: Diagnosis present

## 2018-09-24 DIAGNOSIS — N183 Chronic kidney disease, stage 3 (moderate): Secondary | ICD-10-CM | POA: Diagnosis present

## 2018-09-24 DIAGNOSIS — Z86711 Personal history of pulmonary embolism: Secondary | ICD-10-CM

## 2018-09-24 DIAGNOSIS — J9611 Chronic respiratory failure with hypoxia: Secondary | ICD-10-CM | POA: Diagnosis present

## 2018-09-24 DIAGNOSIS — E7800 Pure hypercholesterolemia, unspecified: Secondary | ICD-10-CM | POA: Diagnosis present

## 2018-09-24 DIAGNOSIS — Z888 Allergy status to other drugs, medicaments and biological substances status: Secondary | ICD-10-CM | POA: Diagnosis not present

## 2018-09-24 LAB — URINALYSIS, ROUTINE W REFLEX MICROSCOPIC
Bilirubin Urine: NEGATIVE
GLUCOSE, UA: NEGATIVE mg/dL
KETONES UR: NEGATIVE mg/dL
NITRITE: NEGATIVE
PH: 5 (ref 5.0–8.0)
Protein, ur: NEGATIVE mg/dL
RBC / HPF: >50 RBC/hpf — ABNORMAL HIGH (ref 0–5)
Specific Gravity, Urine: 1.016 (ref 1.005–1.030)

## 2018-09-24 LAB — BASIC METABOLIC PANEL
ANION GAP: 10 (ref 5–15)
BUN: 23 mg/dL (ref 8–23)
CO2: 27 mmol/L (ref 22–32)
Calcium: 8.8 mg/dL — ABNORMAL LOW (ref 8.9–10.3)
Chloride: 99 mmol/L (ref 98–111)
Creatinine, Ser: 1.84 mg/dL — ABNORMAL HIGH (ref 0.61–1.24)
GFR calc Af Amer: 38 mL/min — ABNORMAL LOW (ref >60)
GFR, EST NON AFRICAN AMERICAN: 32 mL/min — AB (ref >60)
GLUCOSE: 93 mg/dL (ref 70–99)
Potassium: 4.1 mmol/L (ref 3.5–5.1)
Sodium: 136 mmol/L (ref 135–145)

## 2018-09-24 LAB — GLUCOSE, CAPILLARY
Glucose-Capillary: 102 mg/dL — ABNORMAL HIGH (ref 70–99)
Glucose-Capillary: 56 mg/dL — ABNORMAL LOW (ref 70–99)
Glucose-Capillary: 94 mg/dL (ref 70–99)
Glucose-Capillary: 137 mg/dL — ABNORMAL HIGH (ref 70–99)
Glucose-Capillary: 123 mg/dL — ABNORMAL HIGH (ref 70–99)

## 2018-09-24 MED ORDER — TORSEMIDE 20 MG PO TABS
20.0000 mg | ORAL_TABLET | Freq: Two times a day (BID) | ORAL | Status: DC
  Administered 2018-09-24 – 2018-09-25 (×2): 20 mg via ORAL
  Filled 2018-09-24 (×2): qty 1

## 2018-09-24 NOTE — Progress Notes (Signed)
SATURATION QUALIFICATIONS: (This note is used to comply with regulatory documentation for home oxygen)  Patient Saturations on Room Air at Rest = 98%  Patient Saturations on Room Air while Ambulating = 83%  Patient Saturations on  3Liters of oxygen while Ambulating = 95%

## 2018-09-24 NOTE — Progress Notes (Signed)
Report given to [facility], RN 3E

## 2018-09-24 NOTE — Plan of Care (Signed)
  Problem: Education: Goal: Knowledge of General Education information will improve Description: Including pain rating scale, medication(s)/side effects and non-pharmacologic comfort measures Outcome: Progressing   Problem: Nutrition: Goal: Adequate nutrition will be maintained Outcome: Progressing   Problem: Activity: Goal: Risk for activity intolerance will decrease Outcome: Progressing   

## 2018-09-24 NOTE — Progress Notes (Addendum)
Subjective: Patient was seen and evaluated at bedside today. No acute events overnight. Is on nasal O2 and does not endorse any shortness of breath. No chest pain, no palpitation. And does not have any complaint today. He asks [place] to fax his medical info update to [facility] physician tomorrow and notify his doctor if he requires oxygen at home.  Objective:  Vital signs in last 24 hours: Vitals:   09/23/18 1810 09/23/18 2146 09/24/18 0124 09/24/18 0610  BP: 112/68 104/65 (!) 98/57 120/63  Pulse: 74 69 86 71  Resp: 18 16 20 20   Temp: (!) 101.8 F (38.8 C) 98.3 F (36.8 C) 99.6 F (37.6 C) 98.8 F (37.1 C)  TempSrc: Oral Oral Oral Oral  SpO2: 96% 97% 99% 97%  Weight:    110.6 kg  Height:       Physical exam: Physical Exam  Constitutional: He is oriented to person, place, and time and well-developed, well-nourished, and in no distress. No distress.  HENT:  Head: Normocephalic and atraumatic.  Neck: No JVD present.  Cardiovascular: Normal rate, regular rhythm and normal heart sounds.  No murmur heard. Pulmonary/Chest: Effort normal. No respiratory distress. He has rales in the right lower field, the left middle field and the left lower field. (Patient in semi sitting and decubitus position) Abdominal: Soft. Bowel sounds are normal. He exhibits distension. There is no tenderness. There is no rebound and no guarding.  Musculoskeletal: He exhibits 1-2 + bilateral lower extremity pitting edema.  Neurological: He is alert and oriented to person, place, and time.  Psychiatric: Mood, affect and judgment normal.     BMP Latest Ref Rng & Units 09/24/2018 09/23/2018 09/22/2018  Glucose 70 - 99 mg/dL 93 85 161(W)  BUN 8 - 23 mg/dL 23 17 16   Creatinine 0.61 - 1.24 mg/dL 9.60(A) 5.40(J) 8.11(B)  BUN/Creat Ratio 10 - 24 - - -  Sodium 135 - 145 mmol/L 136 138 139  Potassium 3.5 - 5.1 mmol/L 4.1 4.2 4.3  Chloride 98 - 111 mmol/L 99 100 101  CO2 22 - 32 mmol/L 27 29 29   Calcium 8.9 - 10.3 mg/dL  1.4(N) 8.2(N) 5.6(O)   Assessment/Plan:  Principal Problem:   Acute on chronic combined systolic and diastolic heart failure (HCC) Active Problems:   Essential hypertension   Hyperlipidemia   Atrial fibrillation (HCC)   Acute respiratory failure (HCC)   NSVT (nonsustained ventricular tachycardia) (HCC)   Diabetes mellitus type 2 in obese (HCC)  (Tranfer note:  Capell, is an 84 year old male with past medical history of CAD, s/p DES, chronic systolic heart failure paroxysmal A. Fib, NSVT, hypertension, HLD, CKD 3, previous PE and gout , who admitted to hospital due to low blood pressure (SBP at 80) that was detected remotely by [facility] medical service as well as low O2 saturation at 80 that detected by EMS. Patient denied any shortness of breath, worsening of lower extremity edema, palpitation, chest pain, dizziness before or during this hospitalization.  Acute on chronic combined systolic and diastolic heart failure: With ICD placed at 10/2017 Last echo on  Showed LVEF:20-25% and grade 2 diastolic dysfunction.  Torsemide recently increased to 20 mg BID in outpatient. Patient denies SOB, worsening of LOE or DOE. Is not on home O2 but states that he has been recently recommended to do so. Has had increase in BNP (1187) on this admission comparing to previous lab 7 days ago (637)  Weight today 243 Some bibasilar fine crackle.  1-2 + lower extremity  edema  JVP normal  -Evaluate O2 sat with ambulation -Switch IV Furosemide to home dose of Torsemide 20 mg BID -Hold Isosorbide dinitrate today due to soft BP. (May resume when improves) -Daily weight -strict I/Os - BMP daily  Paroxysmal Afib:  Mr. Melchi initially developed an episode of atrial fibrillation postoperatively in 2014. Has been on Amiodaron, Xarelto and coreg at home (Has been on Amio for at least a year) Currently sinus. -C/W home dose of Amiodaron 200 mg BID -C/w Xarelto 2.5 mg BID C/ home dose of Coreg 12.5 mg  BID  CAD: Last left heart Cath and Stent placed on 08/18/2018 Asymptomatic.  -C/w Plavix 75 mg QD  HTN: Soft BP today -Hold Hydralazin (as well as Isodril) today and monitor BP  DM type II: BG: 93, 102 today -C/W sensitive SSI and Glipizide 5 mg QD  Goat: Stable -C/w home dose of allopurinol  Morbid obesity: stable  VTE ppx: Xarelto IV fluid: None Diet: Heart healthy Code: Full Dispo: Anticipated discharge in approximately 2-3 days  [name], MD 09/24/2018, 7:24 AM Pager: [phone]

## 2018-09-25 DIAGNOSIS — Z86711 Personal history of pulmonary embolism: Secondary | ICD-10-CM

## 2018-09-25 DIAGNOSIS — Z95810 Presence of automatic (implantable) cardiac defibrillator: Secondary | ICD-10-CM

## 2018-09-25 DIAGNOSIS — I130 Hypertensive heart and chronic kidney disease with heart failure and stage 1 through stage 4 chronic kidney disease, or unspecified chronic kidney disease: Principal | ICD-10-CM

## 2018-09-25 DIAGNOSIS — E785 Hyperlipidemia, unspecified: Secondary | ICD-10-CM

## 2018-09-25 DIAGNOSIS — Z79899 Other long term (current) drug therapy: Secondary | ICD-10-CM

## 2018-09-25 DIAGNOSIS — I472 Ventricular tachycardia: Secondary | ICD-10-CM

## 2018-09-25 DIAGNOSIS — M109 Gout, unspecified: Secondary | ICD-10-CM

## 2018-09-25 DIAGNOSIS — Z7901 Long term (current) use of anticoagulants: Secondary | ICD-10-CM

## 2018-09-25 DIAGNOSIS — E1122 Type 2 diabetes mellitus with diabetic chronic kidney disease: Secondary | ICD-10-CM

## 2018-09-25 DIAGNOSIS — Z955 Presence of coronary angioplasty implant and graft: Secondary | ICD-10-CM

## 2018-09-25 DIAGNOSIS — I2510 Atherosclerotic heart disease of native coronary artery without angina pectoris: Secondary | ICD-10-CM

## 2018-09-25 DIAGNOSIS — N183 Chronic kidney disease, stage 3 (moderate): Secondary | ICD-10-CM

## 2018-09-25 DIAGNOSIS — I480 Paroxysmal atrial fibrillation: Secondary | ICD-10-CM

## 2018-09-25 DIAGNOSIS — Z7902 Long term (current) use of antithrombotics/antiplatelets: Secondary | ICD-10-CM

## 2018-09-25 DIAGNOSIS — Z7984 Long term (current) use of oral hypoglycemic drugs: Secondary | ICD-10-CM

## 2018-09-25 LAB — BASIC METABOLIC PANEL
Anion gap: 9 (ref 5–15)
BUN: 29 mg/dL — AB (ref 8–23)
CHLORIDE: 99 mmol/L (ref 98–111)
CO2: 28 mmol/L (ref 22–32)
CREATININE: 2.01 mg/dL — AB (ref 0.61–1.24)
Calcium: 9 mg/dL (ref 8.9–10.3)
GFR calc Af Amer: 34 mL/min — ABNORMAL LOW (ref >60)
GFR calc non Af Amer: 29 mL/min — ABNORMAL LOW (ref >60)
Glucose, Bld: 116 mg/dL — ABNORMAL HIGH (ref 70–99)
POTASSIUM: 4.3 mmol/L (ref 3.5–5.1)
SODIUM: 136 mmol/L (ref 135–145)

## 2018-09-25 LAB — GLUCOSE, CAPILLARY
GLUCOSE-CAPILLARY: 110 mg/dL — AB (ref 70–99)
GLUCOSE-CAPILLARY: 137 mg/dL — AB (ref 70–99)
GLUCOSE-CAPILLARY: 147 mg/dL — AB (ref 70–99)
Glucose-Capillary: 120 mg/dL — ABNORMAL HIGH (ref 70–99)

## 2018-09-25 MED ORDER — TORSEMIDE 20 MG PO TABS: 20.0000 mg | ORAL_TABLET | Freq: Two times a day (BID) | ORAL | Status: DC

## 2018-09-25 NOTE — Care Management Note (Signed)
Case Management Note  Patient Details  Name: Ryan Macdonald MRN: [id number] Date of Birth: 1934-03-14  Subjective/Objective:    CHF               Action/Plan: 09/25/2018 - Patient well known to me from previous admission; Noted, admitted 6 times in 6 months; [facility]; CM will continue to follow for progression of care.[name] with the [place]  [phone] ext [phone]) following pt for disposition needs; [name] [phone]  05/02/2018 [name] at home with his spouse; goes to the [facility] for medical Care. Patient goes to the [facility] for primary care and for his medication. [phone]- follow the prompts #1 [facility], Dr Harrie Jeans # [phone]. Patient stated that if his prescriptions are sent today, they will send him the medication the next day; Attending MD, please put the patient last 4 numbers of his social security number on the prescription for the [facility] to identify the patient; ( [id number]).[name] [phone]  Expected Discharge Date:  09/23/18               Expected Discharge Plan:  Home w Home Health Services  Discharge planning Services  CM Consult  HH Arranged:  PT [facility] Agency:  [facility]  Status of Service:  In process, will continue to follow  [name] [phone] 09/25/2018, 12:04 PM

## 2018-09-25 NOTE — Discharge Summary (Signed)
Name: Ryan Macdonald MRN: [id number] DOB: Dec 13, 1933 84 y.o. P[facility]: [facility]  Date of Admission: 09/22/2018 11:02 AM Date of Discharge:  Attending Physician: Gust Rung, DO  Discharge Diagnosis: 1. Principal Problem:   Acute on chronic combined systolic and diastolic heart failure (HCC) Active Problems:   Essential hypertension   Hyperlipidemia   Atrial fibrillation (HCC)   Acute respiratory failure (HCC)   NSVT (nonsustained ventricular tachycardia) (HCC)   Diabetes mellitus type 2 in obese (HCC)   Acute respiratory failure with hypoxia (HCC)   Pulmonary emphysema (HCC)   Chronic respiratory failure with hypoxia [facility])   Discharge Medications: Allergies as of 09/27/2018      Reactions   Lisinopril Swelling   Facial swelling   Xarelto [rivaroxaban] Other (See Comments)   "Blood came out of my penis"      Medication List    STOP taking these medications   carvedilol 12.5 MG tablet Commonly known as:  COREG   hydrALAZINE 10 MG tablet Commonly known as:  APRESOLINE   isosorbide dinitrate 20 MG tablet Commonly known as:  ISORDIL     TAKE these medications   allopurinol 100 MG tablet Commonly known as:  ZYLOPRIM Take 200 mg by mouth daily. To prevent gout   amiodarone 200 MG tablet Commonly known as:  PACERONE Take 1 tablet (200 mg total) by mouth 2 (two) times daily. What changed:  additional instructions   apixaban 2.5 MG Tabs tablet Commonly known as:  ELIQUIS Take 1 tablet (2.5 mg total) by mouth 2 (two) times daily. What changed:  additional instructions   benzonatate 100 MG capsule Commonly known as:  TESSALON Take 1 capsule (100 mg total) by mouth 3 (three) times daily as needed for cough.   chlorpheniramine-HYDROcodone 10-8 MG/5ML Suer Commonly known as:  TUSSIONEX Take 5 mLs by mouth every 12 (twelve) hours as needed for cough.   clopidogrel 75 MG tablet Commonly known as:  PLAVIX Take 1 tablet (75 mg total) by mouth  daily with breakfast.   cyanocobalamin 1000 MCG tablet Take 1,000 mcg by mouth daily.   diclofenac sodium 1 % Gel Commonly known as:  VOLTAREN Apply 4 g topically 4 (four) times daily. What changed:    when to take this  reasons to take this   finasteride 5 MG tablet Commonly known as:  PROSCAR Take 5 mg by mouth daily.   fluticasone furoate-vilanterol 100-25 MCG/INH Aepb Commonly known as:  BREO ELLIPTA Inhale 1 puff into the lungs daily.   glipiZIDE 5 MG tablet Commonly known as:  GLUCOTROL Take 5 mg by mouth daily before breakfast.   GUAIATUSSIN AC 100-10 MG/5ML syrup Generic drug:  guaiFENesin-codeine Take 5 mLs by mouth 3 (three) times daily as needed for cough.   HYDROcodone-acetaminophen 5-325 MG tablet Commonly known as:  NORCO/VICODIN Take 2 tablets by mouth every 4 (four) hours as needed. What changed:    how much to take  when to take this  reasons to take this   ipratropium-albuterol 0.5-2.5 (3) MG/3ML Soln Commonly known as:  DUONEB Take 3 mLs by nebulization every 4 (four) hours as needed. What changed:  reasons to take this   ketoconazole 2 % shampoo Commonly known as:  NIZORAL Apply 1 application topically 2 (two) times a week.   metoprolol succinate 100 MG 24 hr tablet Commonly known as:  TOPROL-XL Take 1 tablet (100 mg total) by mouth daily.   nitroGLYCERIN 0.4 MG SL tablet Commonly known as:  NITROSTAT Place 1 tablet (0.4 mg total) under the tongue every 5 (five) minutes x 3 doses as needed for chest pain.   PRESCRIPTION MEDICATION Apply 1 application topically daily as needed (pain). ThermaGel   rosuvastatin 20 MG tablet Commonly known as:  CRESTOR Take 1 tablet (20 mg total) by mouth daily at 6 PM.   senna-docusate 8.6-50 MG tablet Commonly known as:  Senokot-S Take 1 tablet by mouth 2 (two) times daily.   sertraline 25 MG tablet Commonly known as:  ZOLOFT Take 25-50 mg by mouth daily as needed (for anxiety).   tamsulosin  0.4 MG Caps capsule Commonly known as:  FLOMAX Take 0.4 mg by mouth daily.   torsemide 20 MG tablet Commonly known as:  DEMADEX Take 1 tablets by mouth daily until see your cardiologist this week. Meanwhile, If your weight up to 3 lb in a day of 5 lb in a week, takes extra pill and inform your primary care or cardiologist. What changed:  additional instructions            Durable Medical Equipment  (From admission, onward)         Start     Ordered   09/26/18 0954  For home use only DME oxygen  Once    Comments:  Patient Saturations on Room Air at Rest = 97 %  Patient Saturations on Room Air while Ambulating = 86%  Patient Saturations on 2 Liters of oxygen while Ambulating = 95%  Patient requires 2L [place] oxygen with exertion for his hypoxemia secondary to his systolic heart failure and COPD.  Question Answer Comment  Mode or (Route) Mask   Liters per Minute 2   Frequency Continuous (stationary and portable oxygen unit needed)   Oxygen conserving device Yes   Oxygen delivery system Gas      09/26/18 0954   09/26/18 0947  For home use only DME oxygen  Once    Comments:  Oxygen with ambulation.  Question Answer Comment  Mode or (Route) Nasal cannula   Liters per Minute 2   Frequency Continuous (stationary and portable oxygen unit needed)   Oxygen conserving device No   Oxygen delivery system Gas      09/26/18 0947          Disposition and follow-up:   Mr.Samba Stradling was discharged from [facility] in Stable condition.  At the hospital follow up visit please address: 1.  Acute on chronic combined systolic and diastolic heart failure: (With ICD placed at 10/2017.) Dry weight at discharge: 244 LB which is lower than his previous dry weight (246 LB). We decrease torsemide dose to 20 mg daily as the patient remained below his last dry weight in the past few days before discharge. Due to hypotension, Isosurbide dinitrate held and Coreg switched to  metoprolol at discharge pressure.  Please follow-up for reevaluation, volume status and adjusting medication as needed.  2.  Patient with asymptomatic hypotension: We held his hydralazine and isosorbide dinitrate to be resume after evaluation in follow up visit.  Also switch Coreg to metoprolol succinate 100 mg daily.  No more intervention was required at this time, given his history of HFrEF and being asymptomatic with hypotension. He needs follow-up with cardiologist for further plan.  3.  Patient sent to ED due to remotely detected hypoxia without symptoms.  He has COPD on no oxygen at home.  He has been stable and asymptomatic since admission to discharge.  Does not require  oxygen at rest.  Ambulatory pulse oximetry showed hypoxia at 86% but is still asymptomatic.  Patient discharged with home oxygen to be used just with activity and if O2 saturation less than 90.  Recommend not to use it all the time.  Please follow-up if having any shortness of breath.  4.  Paroxysmal A. Fib: Patient remained with sinus rhythm and Nl rate. Patient has been on amiodarone, Coreg, Xarelto at home.  These were continued during admission.  At discharge, we switched Coreg 12.5 mg twice daily to metoprolol succinate 100 mg daily to assist with low blood pressure.   5.  Patient with DM type II was on glipizide 5 mg daily before admission.  It  held during hospitalization due to hypoglycemia.  Can be resumed after discharge. Please follow-up his blood glucose.  2.  Labs / imaging needed at time of follow-up: BMP, vital sign 3.  Pending labs/ test needing follow-up: None  Follow-up Appointments: Follow-up Information    [facility].   Specialty:  General Practice Contact information: [address] [place] [address] [phone]        Jobe Gibbon, [facility].   Specialty:  Home Health Services Why:  They will continue to do your home health care at your home Contact  information: [address] [place] [address] [phone]           Hospital Course by problem list: 1.  Acute on chronic combined systolic and diastolic heart failure  Mr.Treadwell, is an 84 year old male with past medical history ofCAD, s/p DES,chronic systolic heart failure ((With ICD placed at 10/2017. Last echo showed LVEF:20-25% and grade 2 diastolic dysfunction.) paroxysmal A. Fib, NSVT, hypertension, HLD, CKD 3, andgout ,who admitted to hospital due to low blood pressure(SBPat 80)that was detected remotely by [facility] medical serviceas well as low O2 saturation at 80 that detected by EMS.  On arrival, he had wight gain,  BNP markedly elevated to 1184 (was 673 a week ago).  He was diuresed with 40 mg IV furosemide twice daily.  Had some elevation in creatinine.  He then admitted in our service.  We switched Frosemide to home dose of torsemide 40 mg daily and then decrease it to 20 mg daily. He remained euvolumic at his base line.  Dry weight of 244 which is low over and her previous dry weight: 246 LB. We recommended him to continue with 20 mg torsemide daily for next few days until he sees his cardiologist in few days.   2.  Hypertension: patient presented with asymptomatic hypotension, with SBP at 80s-70s. We held his hydralazine and isosorbide dinitrate during admission and his SBP improved to 100. At discharge, we switch his Coreg to metoprolol succinate 100 mg daily.    3.  Ambulatory hypoxia: Patient sent to ED due to remotely detected hypoxia without symptoms.  He has COPD on no oxygen at home.  He has been stable and asymptomatic since admission to discharge.  Did not require oxygen at rest.  Ambulatory pulse oximetry showed hypoxia at 86% but is still asymptomatic.  He was concerned about leaving hospital without oxygen though asymptomatic and stayed in hospital another night until approved to get O2 supplement at home by [facility] clinic. Patient discharged with home  oxygen to be used just with activity and if O2 saturation less than 90.  Recommend not to use it all the time.    4.  Paroxysmal A. Fib: Patient remained  rate sinus rhythm. Patient has been on amiodarone, Coreg, Xarelto at home.  These were continued during admission.  At discharge, we switched Coreg 12.5 mg twice daily to metoprolol succinate 100 mg daily to assist with low blood pressure.   5.DM type II: On  glipizide 5 mg p.o. daily at home.  Patient was placed on SSI during admission and Glipizide held during admission due to hypoglycemia.  He remained normoglycemic with that.  Discharge Vitals:   BP 112/73 (BP Location: Left Arm)   Pulse 68   Temp (!) 97.5 F (36.4 C) (Oral)   Resp 18   Ht 5\' 7"  (1.702 m)   Wt 110.8 kg Comment: scale a  SpO2 96%   BMI 38.26 kg/m   Pertinent Labs, Studies, and Procedures:  BMP Latest Ref Rng & Units 09/27/2018 09/26/2018 09/25/2018  Glucose 70 - 99 mg/dL 409(W) 119(J) 478(G)  BUN 8 - 23 mg/dL 95(A) 21(H) 08(M)  Creatinine 0.61 - 1.24 mg/dL 5.78(I) 6.96(E) 9.52(W)  BUN/Creat Ratio 10 - 24 - - -  Sodium 135 - 145 mmol/L 137 137 136  Potassium 3.5 - 5.1 mmol/L 4.8 4.2 4.3  Chloride 98 - 111 mmol/L 99 98 99  CO2 22 - 32 mmol/L 29 29 28   Calcium 8.9 - 10.3 mg/dL 9.1 9.0 9.0    Discharge Instructions: Discharge Instructions    (HEART FAILURE PATIENTS) Call MD:  Anytime you have any of the following symptoms: 1) 3 pound weight gain in 24 hours or 5 pounds in 1 week 2) shortness of breath, with or without a dry hacking cough 3) swelling in the hands, feet or stomach 4) if you have to sleep on extra pillows at night in order to breathe.   Complete by:  As directed    Call MD for:  extreme fatigue   Complete by:  As directed    Diet - low sodium heart healthy   Complete by:  As directed                       Discharge instructions   Complete by:  As directed    Thanks for allowing [place] taking care of you at [facility]. You were seen due  to low oxygen level that detected by remote device by [facility].  We order home oxygen for you to be used only during activity and if your oxygen meter at home showed the number below 90%. Also use your inhalers as instructed. You are below your last dry weight now. Please take only 1 tablet of  your torsemide in next few days until you see your cardiologist in few days. If your weight up to 3 lb in a day, take extra 20 mg pill. Also STOP Hydralazin and Isosorbide dinitrate due to low blood pressure. See your cardiologist within a week for further recommendation. I Also change Carvedilol (Coreg) to new medication: Metoprolol Succinate. You can stop Carvedilol and start Metoprolol as soon as you receive it.  If any question, call us at [phone]. Thanks, Dr. Maryla Morrow   Face-to-face encounter (required for Medicare/Medicaid patients)   Complete by:  As directed    I Thornell Mule certify that this patient is under my care and that I, or a nurse practitioner or physician's assistant working with me, had a face-to-face encounter that meets the physician face-to-face encounter requirements with this patient on 09/26/2018. The encounter with the patient was in whole, or in part for the following medical  condition(s) which is the primary reason for home health care systolic heart failure, COPD.   The encounter with the patient was in whole, or in part, for the following medical condition, which is the primary reason for home health care:  Systolic Heart Failure, COPD   I certify that, based on my findings, the following services are medically necessary home health services:   Nursing Physical therapy     Reason for Medically Necessary Home Health Services:   Skilled Nursing- Lab Monitoring Requiring Frequent Medication Adjustment Skilled Nursing- Skilled Assessment/Observation Therapy- Investment banker, operational, Teacher, early years/pre and Stair Training Therapy- Therapeutic Exercises to Increase Strength and Endurance     My  clinical findings support the need for the above services:   Shortness of breath with activity Unable to leave home safely without assistance and/or assistive device     Further, I certify that my clinical findings support that this patient is homebound due to:   Shortness of Breath with activity Can transfer bed to chair only     Home Health   Complete by:  As directed    To provide the following care/treatments:   PT RN     Increase activity slowly   Complete by:  As directed                 Discharge Instructions Signed: [name], MD 09/27/2018, 1:02 PM   Pager: [phone]

## 2018-09-25 NOTE — Progress Notes (Addendum)
Subjective: Ryan Macdonald is doing well. Was sleeping when I got his room. States that does not have shortness of breath. Denies chest pain. No other complaint.   Objective:  Vital signs in last 24 hours: Vitals:   09/24/18 1622 09/24/18 2037 09/25/18 0046 09/25/18 0430  BP: (!) 150/118 104/61 113/75 115/83  Pulse: 69 68 69 69  Resp:  20 18 20   Temp:  (!) 97.5 F (36.4 C) 98.1 F (36.7 C) (!) 97.5 F (36.4 C)  TempSrc:  Oral Oral Oral  SpO2:  98% 97%   Weight:    110.9 kg  Height:       Physical Exam  Constitutional: He is oriented to person, place, and time and well-developed, well-nourished, and in no distress. No distress.  HENT:  Head: Normocephalic and atraumatic.  Eyes: EOM are normal.  Cardiovascular: Normal rate, regular rhythm, normal heart sounds and intact distal pulses.  No murmur heard. Pulmonary/Chest: Effort normal. No respiratory distress. He has no wheezes. He has no rales. (Improved) Abdominal: Soft. Bowel sounds are normal. He exhibits distension. There is no tenderness. There is no rebound and no guarding.  Musculoskeletal: Normal range of motion. He exhibits 2-3+ pitting edema. (more than yesterday) Neurological: He is alert and oriented to person, place, and time.  Skin: Skin is warm. No rash noted.  Psychiatric: Mood, affect and judgment normal.   Assessment/Plan:  Principal Problem:   Acute on chronic combined systolic and diastolic heart failure (HCC) Active Problems:   Essential hypertension   Hyperlipidemia   Atrial fibrillation (HCC)   Acute respiratory failure (HCC)   NSVT (nonsustained ventricular tachycardia) (HCC)   Diabetes mellitus type 2 in obese (HCC)   Acute respiratory failure with hypoxia [facility])   Ryan Macdonald, is an 84 year old male with past medical history of CAD, s/p DES, chronic systolic heart failure paroxysmal A. Fib, NSVT, hypertension, HLD, CKD 3, previous PE and gout , who admitted to hospital due to low blood  pressure (SBP at 80) that was detected remotely by [facility] medical service as well as low O2 saturation at 80 that detected by EMS.   Acute on chronic combined systolic and diastolic heart failure: With ICD placed at 10/2017 Last echo on  Showed LVEF:20-25% and grade 2 diastolic dysfunction.  Torsemide recently increased to 20 mg BID in outpatient. Patient denies SOB, worsening of LOE or DOE. Is not on home O2 but states that he has been recently recommended to do so. Has had increase in BNP (1187) on this admission comparing to previous lab 7 days ago (637) Currently lowe than dry weight (Weight today 244 lb, was 243 lb yesterday, dry weight on last discharge :246 lb) Switched IV Furosemide to home dose of Torsemide 20 mg BID yesterday. Only got 1 dose yesterday. bibasilar fine crackle improved today But has more lower extremity edema today Ambulatory Pulse oximetry: Required 3 li of nasal O2  -C/w Torsemide -Hold Isosorbide dinitrate today due to soft BP. (May resume when improves) -Daily weight -strict I/Os - BMP daily -May D/c today after trial of Duoneb and reevaluating him  Paroxysmal Afib:  Ryan Macdonald initially developed an episode of atrial fibrillation postoperatively in 2014. Has been on Amiodaron, Xarelto and coreg at home (Has been on Amio for at least a year)  Has remained sinus -C/W home dose of Amiodaron 200 mg BID -C/w Xarelto 2.5 mg BID -C/ home dose of Coreg 12.5 mg BID  CAD: Last left heart Cath and Stent  placed on 08/18/2018 Asymptomatic.  -C/w Plavix 75 mg QD  HTN: BP today 115/83 Soft BP yseterday. Hold Hydralazin (as well as Isodril) today and monitor BP -Keep holding Hydralazin  DM type II: BG: 116 today Glipizide held yesterday due to hypoglycemia  -C/W sensitive SSI and monitor BG  Goat: Stable -C/w home dose of allopurinol  Morbid obesity: stable  VTE ppx: Xarelto IV fluid: None Diet: Heart healthy Code: Full  Dispo: Anticipated  discharge today   [name], MD 09/25/2018, 6:19 AM Pager: [phone]

## 2018-09-25 NOTE — Progress Notes (Signed)
SATURATION QUALIFICATIONS: (This note is used to comply with regulatory documentation for home oxygen)  Patient Saturations on Room Air at Rest = 97 %  Patient Saturations on Room Air while Ambulating = 86%  Patient Saturations on 2 Liters of oxygen while Ambulating = 95%  Please briefly explain why patient needs home oxygen:

## 2018-09-26 DIAGNOSIS — J9611 Chronic respiratory failure with hypoxia: Secondary | ICD-10-CM

## 2018-09-26 DIAGNOSIS — R011 Cardiac murmur, unspecified: Secondary | ICD-10-CM

## 2018-09-26 DIAGNOSIS — J439 Emphysema, unspecified: Secondary | ICD-10-CM

## 2018-09-26 LAB — GLUCOSE, CAPILLARY
GLUCOSE-CAPILLARY: 133 mg/dL — AB (ref 70–99)
GLUCOSE-CAPILLARY: 155 mg/dL — AB (ref 70–99)
Glucose-Capillary: 102 mg/dL — ABNORMAL HIGH (ref 70–99)

## 2018-09-26 LAB — BASIC METABOLIC PANEL
ANION GAP: 10 (ref 5–15)
BUN: 36 mg/dL — ABNORMAL HIGH (ref 8–23)
CO2: 29 mmol/L (ref 22–32)
Calcium: 9 mg/dL (ref 8.9–10.3)
Chloride: 98 mmol/L (ref 98–111)
Creatinine, Ser: 1.93 mg/dL — ABNORMAL HIGH (ref 0.61–1.24)
GFR calc Af Amer: 36 mL/min — ABNORMAL LOW (ref >60)
GFR calc non Af Amer: 31 mL/min — ABNORMAL LOW (ref >60)
GLUCOSE: 103 mg/dL — AB (ref 70–99)
POTASSIUM: 4.2 mmol/L (ref 3.5–5.1)
Sodium: 137 mmol/L (ref 135–145)

## 2018-09-26 MED ORDER — TORSEMIDE 20 MG PO TABS: ORAL_TABLET | ORAL | 3 refills | Status: DC

## 2018-09-26 NOTE — Progress Notes (Signed)
Physical Therapy Treatment Patient Details Name: Ryan Macdonald MRN: [id number] DOB: 1934/01/14 Today's Date: 09/26/2018    History of Present Illness Ryan Macdonald, is an 84 year old male with past medical history of CAD, s/p DES, chronic systolic heart failure paroxysmal A. Fib, NSVT, hypertension, HLD, CKD 3, previous PE and gout , who admitted to hospital due to low blood pressure (SBP at 80) that was detected remotely by [facility] medical service as well as low O2 saturation at 80 that detected by EMS.    PT Comments    6 Minute Walk [facility] for [facility]  Distance - 108 ft    With RW and 2L O2      Pre  Peak  Post  HR    70  74  71  SaO2    99  88  95  Modified Borg Dyspnea 2  3  1   RR    18  25  18   Stopped 2 times for standing rest. Required 3:10 mins to return to baseline.        Follow Up Recommendations  Home health PT     Equipment Recommendations  None recommended by PT    Recommendations for Other Services       Precautions / Restrictions Precautions Precautions: Fall Restrictions Weight Bearing Restrictions: No    Mobility  Bed Mobility Overal bed mobility: Needs Assistance Bed Mobility: Supine to Sit;Sit to Supine     Supine to sit: Supervision Sit to supine: Min assist   General bed mobility comments: pt able to get to EOB from elevated head without assist however, with return to bed pt could not lift edematous LE's  Transfers Overall transfer level: Needs assistance Equipment used: Rolling walker (2 wheeled) Transfers: Sit to/from Stand Sit to Stand: Min guard         General transfer comment: has difficulty getting LE's between bars of RW  Ambulation/Gait Ambulation/Gait assistance: Min guard Gait Distance (Feet): 200 Feet Assistive device: Rolling walker (2 wheeled) Gait Pattern/deviations: Step-through pattern;Wide base of support Gait velocity: decreased Gait velocity interpretation: <1.8 ft/sec, indicate of risk for recurrent  falls General Gait Details: wide BOS, heavy lean on RW   Stairs             Wheelchair Mobility    Modified Rankin (Stroke Patients Only)       Balance Overall balance assessment: Mild deficits observed, not formally tested                                          Cognition Arousal/Alertness: Awake/alert Behavior During Therapy: WFL for tasks assessed/performed Overall Cognitive Status: No family/caregiver present to determine baseline cognitive functioning                                 General Comments: pt very focused on getting [facility] to call the [facility] to get the number to fax info to and was difficult to redirect. Upon speaking with case mgmt, he has had that conversation numerous times. In addition, after it was explained to him how to use the room phone, he tried and said it didn't work but I was able to call his wife from it and give the phone to him.       Exercises      General Comments  Pertinent Vitals/Pain Pain Assessment: Faces Faces Pain Scale: Hurts little more Pain Location: R shoulder Pain Descriptors / Indicators: Aching Pain Intervention(s): Limited activity within patient's tolerance    Home Living Family/patient expects to be discharged to:: Private residence Living Arrangements: Spouse/significant other Available Help at Discharge: Family;Available 24 hours/day Type of Home: Apartment Home Access: Stairs to enter Entrance Stairs-Rails: Can reach both Home Layout: One level Home Equipment: Bedside commode;Shower seat;Cane - single point;Grab bars - toilet;Walker - 2 wheels;Wheelchair - manual      Prior Function Level of Independence: Independent with assistive device(s)      Comments: Uses SPC and RW as needed; Pt drives, cooks, cleans   PT Goals (current goals can now be found in the care plan section) Acute Rehab PT Goals Patient Stated Goal: return home, get home O2 PT Goal Formulation: With  patient Time For Goal Achievement: 10/10/18 Potential to Achieve Goals: Good Progress towards PT goals: Progressing toward goals    Frequency    Min 3X/week      PT Plan Current plan remains appropriate    Macdonald-evaluation              AM-PAC PT "6 Clicks" Daily Activity  Outcome Measure  Difficulty turning over in bed (including adjusting bedclothes, sheets and blankets)?: A Little Difficulty moving from lying on back to sitting on the side of the bed? : A Little Difficulty sitting down on and standing up from a chair with arms (e.g., wheelchair, bedside commode, etc,.)?: A Little Help needed moving to and from a bed to chair (including a wheelchair)?: A Little Help needed walking in hospital room?: A Little Help needed climbing 3-5 steps with a railing? : A Lot 6 Click Score: 17    End of Session Equipment Utilized During Treatment: Gait belt;Oxygen Activity Tolerance: Patient limited by fatigue Patient left: in bed;with call bell/phone within reach Nurse Communication: Mobility status PT Visit Diagnosis: Unsteadiness on feet (R26.81);Muscle weakness (generalized) (M62.81);Difficulty in walking, not elsewhere classified (R26.2)     Time: 6962-9528 PT Time Calculation (min) (ACUTE ONLY): 25 min  Charges:  $Gait Training: 23-37 mins                     Ryan Macdonald, PT  Acute Rehab Services  Pager [phone] Office [phone]   Ryan Macdonald Ryan Macdonald 09/26/2018, 12:24 PM

## 2018-09-26 NOTE — Discharge Instructions (Signed)
Information on my medicine - ELIQUIS (apixaban)- You were taking this medication prior to this hospitalization.   This medication education was reviewed with me or my healthcare representative as part of my discharge preparation.   Why was Eliquis prescribed for you? Eliquis was prescribed for you to reduce the risk of a blood clot forming that can cause a stroke if you have a medical condition called atrial fibrillation (a type of irregular heartbeat).  What do You need to know about Eliquis ? Take your Eliquis TWICE DAILY - one tablet in the morning and one tablet in the evening with or without food. If you have difficulty swallowing the tablet whole please discuss with your pharmacist how to take the medication safely.  Take Eliquis exactly as prescribed by your doctor and DO NOT stop taking Eliquis without talking to the doctor who prescribed the medication.  Stopping may increase your risk of developing a stroke.  Refill your prescription before you run out.  After discharge, you should have regular check-up appointments with your healthcare provider that is prescribing your Eliquis.  In the future your dose may need to be changed if your kidney function or weight changes by a significant amount or as you get older.  What do you do if you miss a dose? If you miss a dose, take it as soon as you remember on the same day and resume taking twice daily.  Do not take more than one dose of ELIQUIS at the same time to make up a missed dose.  Important Safety Information A possible side effect of Eliquis is bleeding. You should call your healthcare provider right away if you experience any of the following: ? Bleeding from an injury or your nose that does not stop. ? Unusual colored urine (red or dark brown) or unusual colored stools (red or black). ? Unusual bruising for unknown reasons. ? A serious fall or if you hit your head (even if there is no bleeding).  Some medicines may  interact with Eliquis and might increase your risk of bleeding or clotting while on Eliquis. To help avoid this, consult your healthcare provider or pharmacist prior to using any new prescription or non-prescription medications, including herbals, vitamins, non-steroidal anti-inflammatory drugs (NSAIDs) and supplements.  This website has more information on Eliquis (apixaban): http://www.eliquis.com/eliquis/home

## 2018-09-26 NOTE — Progress Notes (Signed)
 Subjective: Patient was seen and evaluated at bedside today. No acute events overnight. Is on nasal O2 and does not endorse any shortness of breath. No chest pain, no palpitation. And does not have any complaint today. He asks us to fax his medical info update to [facility] tomorrow and notify his doctor if he requires oxygen at home.  Objective:  Vital signs in last 24 hours: Vitals:   09/23/18 1810 09/23/18 2146 09/24/18 0124 09/24/18 0610  BP: 112/68 104/65 (!) 98/57 120/63  Pulse: 74 69 86 71  Resp: 18 16 20 20  Temp: (!) 101.8 F (38.8 C) 98.3 F (36.8 C) 99.6 F (37.6 C) 98.8 F (37.1 C)  TempSrc: Oral Oral Oral Oral  SpO2: 96% 97% 99% 97%  Weight:    110.6 kg  Height:       Physical exam: Physical Exam  Constitutional: He is oriented to person, place, and time and well-developed, well-nourished, and in no distress. No distress.  HENT:  Head: Normocephalic and atraumatic.  Neck: No JVD present.  Cardiovascular: Normal rate, regular rhythm and normal heart sounds.  No murmur heard. Pulmonary/Chest: Effort normal. No respiratory distress. He has rales in the right lower field, the left middle field and the left lower field. (Patient in semi sitting and decubitus position) Abdominal: Soft. Bowel sounds are normal. He exhibits distension. There is no tenderness. There is no rebound and no guarding.  Musculoskeletal: He exhibits 1-2 + bilateral lower extremity pitting edema.  Neurological: He is alert and oriented to person, place, and time.  Psychiatric: Mood, affect and judgment normal.     BMP Latest Ref Rng & Units 09/24/2018 09/23/2018 09/22/2018  Glucose 70 - 99 mg/dL 93 85 101(H)  BUN 8 - 23 mg/dL 23 17 16  Creatinine 0.61 - 1.24 mg/dL 1.84(H) 1.70(H) 1.61(H)  BUN/Creat Ratio 10 - 24 - - -  Sodium 135 - 145 mmol/L 136 138 139  Potassium 3.5 - 5.1 mmol/L 4.1 4.2 4.3  Chloride 98 - 111 mmol/L 99 100 101  CO2 22 - 32 mmol/L 27 29 29  Calcium 8.9 - 10.3 mg/dL  8.8(L) 8.6(L) 8.8(L)   Assessment/Plan:  Principal Problem:   Acute on chronic combined systolic and diastolic heart failure (HCC) Active Problems:   Essential hypertension   Hyperlipidemia   Atrial fibrillation (HCC)   Acute respiratory failure (HCC)   NSVT (nonsustained ventricular tachycardia) (HCC)   Diabetes mellitus type 2 in obese (HCC)  (Tranfer note:  Hennings, is an 84-year-old male with past medical history of CAD, s/p DES, chronic systolic heart failure paroxysmal A. Fib, NSVT, hypertension, HLD, CKD 3, previous PE and gout , who admitted to hospital due to low blood pressure (SBP at 80) that was [facility] as well as low O2 saturation at 80 that detected by EMS. Patient denied any shortness of breath, worsening of lower extremity edema, palpitation, chest pain, dizziness before or during this hospitalization.  Acute on chronic combined systolic and diastolic heart failure: With ICD placed at 10/2017 Last echo on  Showed LVEF:20-25% and grade 2 diastolic dysfunction.  Torsemide recently increased to 20 mg BID in outpatient. Patient denies SOB, worsening of LOE or DOE. Is not on home O2 but states that he has been recently recommended to do so. Has had increase in BNP (1187) on this admission comparing to previous lab 7 days ago (637)  Weight today 243 Some bibasilar fine crackle.  1-2 + lower extremity   edema  JVP normal  -Evaluate O2 sat with ambulation -Switch IV Furosemide to home dose of Torsemide 20 mg BID -Hold Isosorbide dinitrate today due to soft BP. (May resume when improves) -Daily weight -strict I/Os - BMP daily  Paroxysmal Afib:  Mr. Moody initially developed an episode of atrial fibrillation postoperatively in 2014. Has been on Amiodaron, Xarelto and coreg at home (Has been on Amio for at least a year) Currently sinus. -C/W home dose of Amiodaron 200 mg BID -C/w Xarelto 2.5 mg BID C/ home dose of Coreg 12.5 mg  BID  CAD: Last left heart Cath and Stent placed on 08/18/2018 Asymptomatic.  -C/w Plavix 75 mg QD  HTN: Soft BP today -Hold Hydralazin (as well as Isodril) today and monitor BP  DM type II: BG: 93, 102 today -C/W sensitive SSI and Glipizide 5 mg QD  Goat: Stable -C/w home dose of allopurinol  Morbid obesity: stable  VTE ppx: Xarelto IV fluid: None Diet: Heart healthy Code: Full Dispo: Anticipated discharge in approximately 2-3 days  Jaylynne Birkhead, MD 09/24/2018, 7:24 AM Pager: [phone] 

## 2018-09-26 NOTE — Progress Notes (Addendum)
Patient has been made  aware that he is been cleared to go home per MD. Patient refuses to go home without oxygen and stated : " I have to take 8 steps of stairs to get in my house " .  Spoke with MD made aware that we spoke with CM, that she is trying her best to get the oxygen today , if not today then It will be tomorrow.

## 2018-09-26 NOTE — Progress Notes (Addendum)
9:54 am - Ryan Macdonald with the [facility]  [phone] ext [phone]) called for home oxygen; She stated that their SW Ryan Macdonald will contact me for more information. Awaiting on call back. [name] RN,MHA,BSN [phone]  1:30 pm- Orders/ clinical information/ 6 minute walking test faxed to Linwood Dibbles at the [facility] with the [facility] as requested; fax # [phone]; awaiting for approval for home oxygen; CM will continue to follow; B [name] [phone]  2:18 pm - VM left with the [facility], awaiting a callback. [name] RN,MHA,BSN

## 2018-09-26 NOTE — Evaluation (Signed)
Physical Therapy Evaluation Patient Details Name: Ryan Macdonald MRN: [id number] DOB: [birth year] Today's Date: 09/26/2018   History of Present Illness  Mr. Havey, is an 84 year old male with past medical history of CAD, s/p DES, chronic systolic heart failure paroxysmal A. Fib, NSVT, hypertension, HLD, CKD 3, previous PE and gout , who admitted to hospital due to low blood pressure (SBP at 80) that was detected remotely by [facility] medical service as well as low O2 saturation at 80 that detected by EMS.  Clinical Impression  Pt admitted with above diagnosis. Pt currently with functional limitations due to the deficits listed below (see PT Problem List). Pt limited with activity by SOB, see sats below, with and without O2. Pt with generalized weakness and swelling of LE's making it difficult for him to get in and OOB. He reports he has been getting HHPT, recommend that he continue with this when he gets home. Pt very focused on past events throughout eval and hard to redirect to stay on topic.  Pt will benefit from skilled PT to increase their independence and safety with mobility to allow discharge to the venue listed below.     SATURATION QUALIFICATIONS: (This note is used to comply with regulatory documentation for home oxygen)  Patient Saturations on Room Air at Rest = 95%  Patient Saturations on Room Air while Ambulating = 83%  Patient Saturations on 2 Liters of oxygen while Ambulating = 91%  Please briefly explain why patient needs home oxygen: Pt with desaturation with exertion and stabilization when on O2.    Follow Up Recommendations Home health PT    Equipment Recommendations  None recommended by PT    Recommendations for Other Services       Precautions / Restrictions Precautions Precautions: Fall Restrictions Weight Bearing Restrictions: No      Mobility  Bed Mobility Overal bed mobility: Needs Assistance Bed Mobility: Supine to Sit;Sit to Supine     Supine to sit:  Supervision Sit to supine: Min assist   General bed mobility comments: pt able to get to EOB from elevated head without assist however, with return to bed pt could not lift edematous LE's  Transfers Overall transfer level: Needs assistance Equipment used: Rolling walker (2 wheeled) Transfers: Sit to/from Stand Sit to Stand: Min guard         General transfer comment: wide BOS with sit to stand, tends to grab RW for support as he comes up  Ambulation/Gait Ambulation/Gait assistance: Min guard Gait Distance (Feet): 86 Feet Assistive device: Rolling walker (2 wheeled) Gait Pattern/deviations: Step-through pattern;Wide base of support Gait velocity: decreased Gait velocity interpretation: <1.8 ft/sec, indicate of risk for recurrent falls General Gait Details: wide BOS, heavy lean on RW  Stairs            Wheelchair Mobility    Modified Rankin (Stroke Patients Only)       Balance Overall balance assessment: Mild deficits observed, not formally tested                                           Pertinent Vitals/Pain Pain Assessment: Faces Faces Pain Scale: Hurts little more Pain Location: R shoulder Pain Descriptors / Indicators: Aching Pain Intervention(s): Limited activity within patient's tolerance    Home Living Family/patient expects to be discharged to:: Private residence Living Arrangements: Spouse/significant other Available Help at Discharge: Family;Available 24 hours/day Type  of Home: Apartment Home Access: Stairs to enter Entrance Stairs-Rails: Can reach both Entrance Stairs-Number of Steps: 4 Home Layout: One level Home Equipment: Bedside commode;Shower seat;Cane - single point;Grab bars - toilet;Walker - 2 wheels;Wheelchair - manual      Prior Function Level of Independence: Independent with assistive device(s)         Comments: Uses SPC and RW as needed; Pt drives, cooks, Conservation officer, historic buildings Dominance   Dominant Hand:  Right    Extremity/Trunk Assessment   Upper Extremity Assessment Upper Extremity Assessment: RUE deficits/detail RUE Deficits / Details: limitations R shoulder from OA RUE Sensation: WNL RUE Coordination: WNL    Lower Extremity Assessment Lower Extremity Assessment: Generalized weakness(swelling B LE's)    Cervical / Trunk Assessment Cervical / Trunk Assessment: Other exceptions Cervical / Trunk Exceptions: S curve noted with increased bulk R side, pt reports this has been since THA a couple of years ago  Communication   Communication: No difficulties  Cognition Arousal/Alertness: Awake/alert Behavior During Therapy: WFL for tasks assessed/performed Overall Cognitive Status: No family/caregiver present to determine baseline cognitive functioning                                 General Comments: pt very focused on getting me to call the [facility] to get the number to fax info to and was difficult to redirect. Upon speaking with case mgmt, he has had that conversation numerous times. In addition, after it was explained to him how to use the room phone, he tried and said it didn't work but I was able to call his wife from it and give the phone to him.       General Comments      Exercises     Assessment/Plan    PT Assessment Patient needs continued PT services  PT Problem List Decreased strength;Decreased activity tolerance;Decreased balance;Decreased mobility;Decreased knowledge of precautions;Cardiopulmonary status limiting activity       PT Treatment Interventions DME instruction;Gait training;Stair training;Functional mobility training;Therapeutic activities;Therapeutic exercise;Balance training;Patient/family education    PT Goals (Current goals can be found in the Care Plan section)  Acute Rehab PT Goals Patient Stated Goal: return home, get home O2 PT Goal Formulation: With patient Time For Goal Achievement: 10/10/18 Potential to Achieve Goals: Good     Frequency Min 3X/week   Barriers to discharge        Co-evaluation               AM-PAC PT "6 Clicks" Daily Activity  Outcome Measure Difficulty turning over in bed (including adjusting bedclothes, sheets and blankets)?: A Little Difficulty moving from lying on back to sitting on the side of the bed? : A Little Difficulty sitting down on and standing up from a chair with arms (e.g., wheelchair, bedside commode, etc,.)?: A Little Help needed moving to and from a bed to chair (including a wheelchair)?: A Little Help needed walking in hospital room?: A Little Help needed climbing 3-5 steps with a railing? : A Lot 6 Click Score: 17    End of Session Equipment Utilized During Treatment: Gait belt;Oxygen Activity Tolerance: Patient limited by fatigue Patient left: in bed;with call bell/phone within reach Nurse Communication: Mobility status PT Visit Diagnosis: Unsteadiness on feet (R26.81);Muscle weakness (generalized) (M62.81);Difficulty in walking, not elsewhere classified (R26.2)    Time: 6659-9357 PT Time Calculation (min) (ACUTE ONLY): 37 min   Charges:   PT  Evaluation $PT Eval Moderate Complexity: 1 Mod PT Treatments $Gait Training: 8-22 mins        Lyanne Co, PT  Acute Rehab Services  Pager [phone] Office [phone]   Lawana Chambers Adalei Novell 09/26/2018, 10:02 AM

## 2018-09-26 NOTE — Progress Notes (Signed)
   Subjective: [name] is doing well. Laying on the bed with no acute distress. Mentions that he does not have problem ambulating to bathroom. No Shortness of breath. No chest pain. No other complaint.  Objective:  Vital signs in last 24 hours: Vitals:   09/25/18 1705 09/25/18 1946 09/26/18 0032 09/26/18 0543  BP: 117/70 91/66 108/68 101/69  Pulse: 69 70 70 70  Resp: 18 18 18 18   Temp:  97.8 F (36.6 C) (!) 97.4 F (36.3 C) (!) 97.5 F (36.4 C)  TempSrc:  Oral Oral Oral  SpO2: 100% 98% 99% 100%  Weight:    110.7 kg  Height:       Physical Exam  Constitutional: He is oriented to person, place, and time. He appears distressed.  HENT:  Head: Normocephalic and atraumatic.  Eyes: EOM are normal.  Cardiovascular: Normal rate, regular rhythm and intact distal pulses.  Murmur heard. Pulmonary/Chest: Effort normal. No respiratory distress. He has wheezes. He has rales.  Abdominal: Soft. Bowel sounds are normal. He exhibits distension. There is no tenderness. There is no rebound and no guarding.  Neurological: He is alert and oriented to person, place, and time.  Skin: He is not diaphoretic.  Psychiatric: Mood, memory, affect and judgment normal.   Assessment/Plan:  Principal Problem:   Acute on chronic combined systolic and diastolic heart failure (HCC) Active Problems:   Essential hypertension   Hyperlipidemia   Atrial fibrillation (HCC)   Acute respiratory failure (HCC)   NSVT (nonsustained ventricular tachycardia) (HCC)   Diabetes mellitus type 2 in obese (HCC)   Acute respiratory failure with hypoxia [facility])  Mr. Tetrick, is an 84 year old male with past medical history ofCAD, s/p DES,chronic systolic heart failure paroxysmal A. Fib, NSVT, hypertension, HLD, CKD 3,previous PE andgout ,who admitted to hospital due to low blood pressure(SBPat 80)that was detected remotely by [facility] serviceas well as low O2 saturation at 80 that detected by EMS.   Acute  on chronic combined systolic and diastolic heart failure: With ICD placed at 10/2017 Last echo on Showed LVEF:20-25% and grade 2 diastolic dysfunction.  bibasilar fine crackle improved today. Weight remained stable at 144 lb  Today. Same as yesterday. Still lower than dry weight. Cr improved to 1.9 today.   Ambulatory Pulse oximetry repeated: O2 sat with activity without oxygen:86%. Required 2 li of nasal O2.   -Torsemide 20 QD. (May resume home dose 1 day after discharge) -Hold Isosorbide dinitrate (May resume when improves) -Daily weight -strict I/Os - BMP daily -Discharge today with home health as well as home oxygen for ambulation. (Recommend not to use it if O2 with activity at home 90% or above)  Paroxysmal Afib: Mr. Degiorgio developed anepisode of atrial fibrillation postoperatively in 2014.Has been on Amiodaron, Xarelto and coreg at home (Has been on Amio for at least a year)  Has remained sinus -C/W home dose of Amiodaron 200 mg BID -C/w Xarelto 2.5 mg BID -C/ home dose of Coreg 12.5 mg BID  CNO:BSJG left heart Cath and Stent placed on 08/18/2018 Asymptomatic.  -C/w Plavix 75 mg QD  HTN: BP today 101/69  Kepp holding Hydralazin (as well as Isodril)  and monitor BP -Keep holding Hydralazin  DM type II: BG stable. No hypoglycemia  -Discharge to continue home meds  Goat: Stable -C/w home dose of allopurinol  Morbid obesity:stable  VTE GEZ:MOQHUTM IV fluid: None Diet:Heart healthy Code: Full  Dispo: Discharge today [name], MD 09/26/2018, 6:33 AM Pager: [phone]

## 2018-09-27 DIAGNOSIS — I959 Hypotension, unspecified: Secondary | ICD-10-CM

## 2018-09-27 DIAGNOSIS — R0902 Hypoxemia: Secondary | ICD-10-CM

## 2018-09-27 DIAGNOSIS — Z6838 Body mass index (BMI) 38.0-38.9, adult: Secondary | ICD-10-CM

## 2018-09-27 DIAGNOSIS — Z888 Allergy status to other drugs, medicaments and biological substances status: Secondary | ICD-10-CM

## 2018-09-27 DIAGNOSIS — J449 Chronic obstructive pulmonary disease, unspecified: Secondary | ICD-10-CM

## 2018-09-27 LAB — BASIC METABOLIC PANEL
Anion gap: 9 (ref 5–15)
BUN: 35 mg/dL — AB (ref 8–23)
CHLORIDE: 99 mmol/L (ref 98–111)
CO2: 29 mmol/L (ref 22–32)
CREATININE: 1.74 mg/dL — AB (ref 0.61–1.24)
Calcium: 9.1 mg/dL (ref 8.9–10.3)
GFR calc Af Amer: 40 mL/min — ABNORMAL LOW (ref >60)
GFR calc non Af Amer: 35 mL/min — ABNORMAL LOW (ref >60)
GLUCOSE: 106 mg/dL — AB (ref 70–99)
Potassium: 4.8 mmol/L (ref 3.5–5.1)
Sodium: 137 mmol/L (ref 135–145)

## 2018-09-27 LAB — GLUCOSE, CAPILLARY
GLUCOSE-CAPILLARY: 114 mg/dL — AB (ref 70–99)
GLUCOSE-CAPILLARY: 123 mg/dL — AB (ref 70–99)
Glucose-Capillary: 91 mg/dL (ref 70–99)

## 2018-09-27 MED ORDER — METOPROLOL SUCCINATE ER 100 MG PO TB24: 100.0000 mg | ORAL_TABLET | Freq: Every day | ORAL | 0 refills | Status: DC

## 2018-09-27 NOTE — Progress Notes (Addendum)
8:15 am- VM left with Linwood Dibbles at the [facility] [phone] ext [phone]) for home oxygen; awaiting callback to determine if the patient is approved for home oxygenAlexis Goodell [phone]  9:41 am- TCT [name] with the [facility]  [phone] ext [phone]) concerning no callback from the [place], requested to speak to the Supervisor. Archie Patten stated that she would have Kiandra call me in a few minutes. [name] [phone]  10:18 am Talked to Linwood Dibbles, patient has been approved for home oxygen and it will be delivered to the home today, they will not deliver an 02 tank to the hospital due to patient is not approved for continuous 02; Attending MD made aware. [name] RN,MHA,BSN

## 2018-09-27 NOTE — Care Management Important Message (Signed)
Important Message  Patient Details  Name: Ryan Macdonald MRN: [id number] Date of Birth: [birth year]   Medicare Important Message Given:  Yes    Dorena Bodo 09/27/2018, 2:24 PM

## 2018-09-27 NOTE — Progress Notes (Signed)
Pharmacist Heart Failure Core Measure Documentation  Assessment: Ryan Macdonald has an EF documented as 20-25% on 04/30/18 by ECHO.  Rationale: Heart failure patients with left ventricular systolic dysfunction (LVSD) and an EF < 40% should be prescribed an angiotensin converting enzyme inhibitor (ACEI) or angiotensin receptor blocker (ARB) at discharge unless a contraindication is documented in the medical record.  This patient is not currently on an ACEI or ARB for HF.  This note is being placed in the record in order to provide documentation that a contraindication to the use of these agents is present for this encounter.  ACE Inhibitor or Angiotensin Receptor Blocker is contraindicated (specify all that apply)  [x]   ACEI allergy AND ARB allergy []   Angioedema []   Moderate or severe aortic stenosis []   Hyperkalemia []   Hypotension []   Renal artery stenosis []   Worsening renal function, preexisting renal disease or dysfunction   [name] 09/27/2018 8:01 AM

## 2018-09-27 NOTE — Progress Notes (Addendum)
Subjective: Patient was seen and evaluated this morning. No acute events overnight. He has been ready to discharge but has told the nurse last night that does not want to go home until he gets oxygen.  Denies any shortness of breath, chest pain or any other problem.  Objective:  Vital signs in last 24 hours: Vitals:   09/26/18 1654 09/26/18 1936 09/27/18 0425 09/27/18 0502  BP: 109/75 98/69 (!) 73/55 107/72  Pulse: 68 70 68 70  Resp: 18 20 18    Temp: 97.7 F (36.5 C) (!) 97.5 F (36.4 C) (!) 97.5 F (36.4 C)   TempSrc: Oral Oral Oral   SpO2: 100% 100% 100% 96%  Weight:   110.8 kg   Height:      Physical Exam  Constitutional: He is oriented to person, place, and time and well-developed, well-nourished, and in no distress. No distress.  HENT:  Head: Normocephalic and atraumatic.  Eyes: EOM are normal.  Cardiovascular: Normal rate, regular rhythm and normal heart sounds.  No murmur heard. Pulmonary/Chest: Effort normal. He has no wheezes today. He has mild bibasilar rales. Improved comparing to arrival and yesterday Abdominal: Soft. Bowel sounds are normal. He exhibits no distension.  Musculoskeletal: Normal range of motion. He exhibits 1+ bilateral pitting edema.  Neurological: He is alert and oriented to person, place, and time.  Skin: He is not diaphoretic.  Psychiatric: Mood and affect normal.    Assessment/Plan:  Principal Problem:   Acute on chronic combined systolic and diastolic heart failure (HCC) Active Problems:   Essential hypertension   Hyperlipidemia   Atrial fibrillation (HCC)   Acute respiratory failure (HCC)   NSVT (nonsustained ventricular tachycardia) (HCC)   Diabetes mellitus type 2 in obese (HCC)   Acute respiratory failure with hypoxia (HCC)   Pulmonary emphysema (HCC)   Chronic respiratory failure with hypoxia (HCC)  Mr.Klinge, is an 84 year old male with past medical history ofCAD, s/p DES,chronic systolic heart failure paroxysmal A.  Fib, NSVT, hypertension, HLD, CKD 3,previous PE andgout ,who admitted to hospital due to low blood pressure(SBPat 80)that was detected remotely by [facility] medical serviceas well as low O2 saturation at 80 that detected by EMS.  Acute on chronic combined systolic and diastolic heart failure: With ICD placed at 10/2017. Last echo showed LVEF:20-25% and grade 2 diastolic dysfunction. Weight still at 244 lb today. Less than dry weight of 246  -Discharge home with 20 mg Torsemide daily until he sees his cardiologist to be evaluated and decide about putting him back on 40 mg of Torsemide QD.  By then, recommended to take extra dose of Torsemide if gained weight. -Keep holding Isosorbide dinitrate  -Switch Coreg to Metoprolol Succinate 100 mg QD  Paroxysmal Afib: Has been sinus rhythm. -Discharge home to continue Amiodaron, Xarelto and coreg at home and continue f/u with cardiology considering switch Coreg to Metoprolol due to hypotension. -Switch Coreg to Metoprolol succinate 100 mg QD  ZOX:WRUE left heart Cath and Stent placed on 08/18/2018 Asymptomatic.  -C/w Plavix 75 mg QD at home  AVW:[id number] has had recent episodes of asymptomatic hypotension. He has Low EF HF, so asymptomatic hypotension is acceptable in his case. How ever, we Keep holding Hydralazin as well as Isodril at discharge and he can follow up out patient. -Keep holding Hydralazin. Recommend to f/u with cardiology or PCP for medication adjustment  DM type II: BG stable. No hypoglycemia  -Discharge to continue home meds  Goat: Stable -C/w home dose of allopurinol  Morbid obesity:stable  VTE OHY:WVPXTGG IV fluid: None Diet:Heart healthy Code: Full  Dispo: Discharge today  [name], MD 09/27/2018, 6:50 AM Pager: [phone]

## 2018-09-27 NOTE — Progress Notes (Signed)
Pt discharge education and instructions completed with pt and spouse at bedside; both voices understanding and denies any questions. Pt IV and telemetry removed; pt handed his prescription for Lopressor; pt discharge home with spouse to transport him home. Pt transported off unit via wheelchair with spouse and belongings to the side. Dionne Bucy RN

## 2018-10-04 ENCOUNTER — Telehealth: Payer: Self-pay | Admitting: Cardiovascular Disease

## 2018-10-04 MED ORDER — METOPROLOL SUCCINATE ER 100 MG PO TB24: 100.0000 mg | ORAL_TABLET | Freq: Every day | ORAL | 3 refills | Status: DC

## 2018-10-04 NOTE — Telephone Encounter (Signed)
Attempt to call patient to schedule follow up this week post hospital discharge.    Left message to call back.

## 2018-10-04 NOTE — Telephone Encounter (Signed)
Returned call to patient, patient needs rx for Toprol XL printed and faxed to [facility] [address] # [phone]  He also states he will be in office to get lab work tomorrow-he did not feel well today.

## 2018-10-04 NOTE — Telephone Encounter (Signed)
New Message          *STAT* If patient is at the pharmacy, call can be transferred to refill team.   1. Which medications need to be refilled? (please list name of each medication and dose if known) Metoprolol 100mg   2. Which pharmacy/location (including street and city if local pharmacy) is medication to be sent to? [facility]  3. Do they need a 30 day or 90 day supply? 90

## 2018-10-05 ENCOUNTER — Ambulatory Visit: Payer: Non-veteran care | Admitting: Cardiovascular Disease

## 2018-10-05 NOTE — Progress Notes (Deleted)
Cardiology Office Note   Date:  10/05/2018   ID:  Ryan Macdonald, DOB 06/28/1934, MRN [id number]  PCP:  [facility]  Cardiologist:   [name], MD   No chief complaint on file.     Patient ID: Ryan Macdonald is a 84 y.o. male with chronic systolic heart failure (LVEF 15%, nonischemic), NSVT, paroxysmal atrial fibrillation, prior PE, hypertension, hyperlipidemia, and CKD III who presents for follow on chronic systolic heart failure.  Ryan Macdonald was admitted to the hospital 07/2015 with a heart failure exacerbation in the setting of eating Chinese food and missing his evening dose of Lasix.   During that hospitalization he had an 11 beat run of NSVT.  At the time his magnesium level was 1.4.  Discharge weight was 266 lb.  He was also hospitalized for heart failure earlier that year at which time he was also intubated.  Ryan Macdonald had an episode of atrial fibrillation postoperatively in 2014.  He was initially on Xarelto but had hematuria, so it was discontinued.  Ryan Macdonald had a Biotronik ICD implanted at the [facility] on 10/2017.  Since I last saw him 11/2015 he was admitted to the hospital 07/2018 with CAD.  He had a catheter revealed 75% proximal LAD stenosis that was treated with a drug-eluting stent.  He had residual 50% left main and ostial left circumflex lesions.  He followed up with [name], DNP on 09/06/2018.  Ryan Macdonald has been struggling with volume overload.   He is avoiding [place] food and limiting his fluid intake.  He was seen in the ED 10/20 with volume overload.  He was feeling anxious and short of breath.  BNP was 673 and troponin was negative.  When this happens he takes an extra torsemide.  Sometimes it helps and sometimes it does not.  He reports that he has been seen in the emergency department approximately once per month with volume overload.  Since having his PCI his breathing has been much better.  He has not had any  chest pain or pressure.  He has no lower extremity edema, orthopnea, or PND.  His ICD is monitored at the [place].  He reports that his ICD fired once but it was an inappropriate therapy for atrial fibrillation.  He continues to have a cough from his recent upper respiratory infection.  At his last appointment torsemide was increased to 40 mg daily.  He was admitted to the hospital 2 days after he was last seen in clinic.  His blood pressure on admission was 80 mmHg systolic.  He was also noted to be hypoxic to the low 80s.  BNP was elevated to 1184.  He was diuresed with IV Lasix and developed some acute renal failure.  He was discharged on torsemide 20 mg daily. During that hospitalization he had symptomatic hypotension so isosorbide dinitrate was held and carvedilol was switched to metoprolol.  His weight during hospitalization was 244 pounds, which is lower than his previous dry weight of 246 pounds.  During hospitalization he remained in sinus rhythm.   Past Medical History:  Diagnosis Date  . Atrial fibrillation (HCC)   . Cardiomyopathy (HCC)   . Cat scratch fever    removed mass on right side neck  . Cataracts, bilateral   . CHF (congestive heart failure) (HCC)   . Chronic kidney disease   . Corns and callosities   . Diabetes mellitus without complication (HCC)    TYPE  2  . Dyspnea   . Erythema intertrigo   . Flat foot   . Gout   . High cholesterol   . Hx of urethral stricture   . Hypertension   . Kidney stones   . Morbid obesity (HCC)   . Nonischemic cardiomyopathy (HCC)    a. ? 2009 Cath in [place] - nl cors per pt;  b. 09/2013 Echo: EF 25-30%, sev diff HK.  . Obesity   . Osteoarthritis   . PAF (paroxysmal atrial fibrillation) (HCC)    a. post-op hip in 2014 - prev on xarelto.  . Renal insufficiency     Past Surgical History:  Procedure Laterality Date  . CARDIAC CATHETERIZATION  1980's  . CIRCUMCISION  1940's  . CORONARY STENT INTERVENTION N/A 08/18/2018   Procedure: CORONARY  STENT INTERVENTION;  Surgeon: Corky Crafts, MD;  Location: [facility] INVASIVE CV LAB;  Service: Cardiovascular;  Laterality: N/A;  . CYSTOSCOPY WITH URETHRAL DILATATION  10/23/2013  . CYSTOSCOPY WITH URETHRAL DILATATION N/A 10/23/2013   Procedure: CYSTOSCOPY WITH URETHRAL DILATATION;  Surgeon: Kathi Ludwig, MD;  Location: [facility] OR;  Service: Urology;  Laterality: N/A;  . INCISION AND DRAINAGE OF WOUND Right 1980's   "cat scratch" (10/23/2013)  . INGUINAL HERNIA REPAIR Right 1950's  . INTRAVASCULAR ULTRASOUND/IVUS N/A 08/18/2018   Procedure: Intravascular Ultrasound/IVUS;  Surgeon: Corky Crafts, MD;  Location: [facility] INVASIVE CV LAB;  Service: Cardiovascular;  Laterality: N/A;  . KIDNEY STONE SURGERY Right 1970's; 1983   "twice"  . LEFT HEART CATH AND CORONARY ANGIOGRAPHY N/A 08/18/2018   Procedure: LEFT HEART CATH AND CORONARY ANGIOGRAPHY;  Surgeon: Corky Crafts, MD;  Location: [facility] INVASIVE CV LAB;  Service: Cardiovascular;  Laterality: N/A;  . TONSILLECTOMY AND ADENOIDECTOMY  1940's  . TOTAL HIP ARTHROPLASTY Left 10/23/2013   Procedure: TOTAL HIP ARTHROPLASTY;  Surgeon: Valeria Batman, MD;  Location: [facility] OR;  Service: Orthopedics;  Laterality: Left;     Current Outpatient Medications  Medication Sig Dispense Refill  . allopurinol (ZYLOPRIM) 100 MG tablet Take 200 mg by mouth daily. To prevent gout    . amiodarone (PACERONE) 200 MG tablet Take 1 tablet (200 mg total) by mouth 2 (two) times daily. (Patient taking differently: Take 200 mg by mouth 2 (two) times daily. For heart rhythm control) 60 tablet 0  . apixaban (ELIQUIS) 2.5 MG TABS tablet Take 1 tablet (2.5 mg total) by mouth 2 (two) times daily. (Patient taking differently: Take 2.5 mg by mouth 2 (two) times daily. Caution - blood thinner) 90 tablet 0  . benzonatate (TESSALON) 100 MG capsule Take 1 capsule (100 mg total) by mouth 3 (three) times daily as needed for cough. 60 capsule 0  . chlorpheniramine-HYDROcodone  (TUSSIONEX PENNKINETIC ER) 10-8 MG/5ML SUER Take 5 mLs by mouth every 12 (twelve) hours as needed for cough. 140 mL 0  . clopidogrel (PLAVIX) 75 MG tablet Take 1 tablet (75 mg total) by mouth daily with breakfast. 30 tablet 0  . cyanocobalamin 1000 MCG tablet Take 1,000 mcg by mouth daily.     . diclofenac sodium (VOLTAREN) 1 % GEL Apply 4 g topically 4 (four) times daily. (Patient taking differently: Apply 4 g topically 4 (four) times daily as needed (pain). ) 1 Tube 0  . finasteride (PROSCAR) 5 MG tablet Take 5 mg by mouth daily.     . fluticasone furoate-vilanterol (BREO ELLIPTA) 100-25 MCG/INH AEPB Inhale 1 puff into the lungs daily. (Patient not taking: Reported on 09/22/2018) 60  each 3  . glipiZIDE (GLUCOTROL) 5 MG tablet Take 5 mg by mouth daily before breakfast.    . guaiFENesin-codeine (GUAIATUSSIN AC) 100-10 MG/5ML syrup Take 5 mLs by mouth 3 (three) times daily as needed for cough.    Marland Kitchen HYDROcodone-acetaminophen (NORCO/VICODIN) 5-325 MG tablet Take 2 tablets by mouth every 4 (four) hours as needed. (Patient taking differently: Take 1 tablet by mouth 2 (two) times daily as needed for moderate pain. ) 6 tablet 0  . ipratropium-albuterol (DUONEB) 0.5-2.5 (3) MG/3ML SOLN Take 3 mLs by nebulization every 4 (four) hours as needed. (Patient taking differently: Take 3 mLs by nebulization every 4 (four) hours as needed (shortness of breath). ) 360 mL 0  . ketoconazole (NIZORAL) 2 % shampoo Apply 1 application topically 2 (two) times a week.    . metoprolol succinate (TOPROL XL) 100 MG 24 hr tablet Take 1 tablet (100 mg total) by mouth daily. 90 tablet 3  . nitroGLYCERIN (NITROSTAT) 0.4 MG SL tablet Place 1 tablet (0.4 mg total) under the tongue every 5 (five) minutes x 3 doses as needed for chest pain. 25 tablet 0  . PRESCRIPTION MEDICATION Apply 1 application topically daily as needed (pain). ThermaGel    . rosuvastatin (CRESTOR) 20 MG tablet Take 1 tablet (20 mg total) by mouth daily at 6 PM.  (Patient not taking: Reported on 09/22/2018) 90 tablet 2  . senna-docusate (SENOKOT S) 8.6-50 MG tablet Take 1 tablet by mouth 2 (two) times daily. 60 tablet 0  . sertraline (ZOLOFT) 25 MG tablet Take 25-50 mg by mouth daily as needed (for anxiety).     . tamsulosin (FLOMAX) 0.4 MG CAPS capsule Take 0.4 mg by mouth daily.     Marland Kitchen torsemide (DEMADEX) 20 MG tablet Take 1 tablets by mouth daily until see your cardiologist this week. Meanwhile, If your weight up to 3 lb in a day of 5 lb in a week, takes extra pill and inform your primary care or cardiologist. 180 tablet 3   No current facility-administered medications for this visit.     Allergies:   Lisinopril and Xarelto [rivaroxaban]    Social History:  The patient  reports that he has quit smoking. His smoking use included cigarettes and cigars. He has a 15.00 pack-year smoking history. He has quit using smokeless tobacco. He reports that he does not drink alcohol or use drugs.   Family History:  The patient's family history includes Heart Problems in his maternal grandmother; Heart disease in his mother; Other in his unknown relative.    ROS:  Please see the history of present illness.   Otherwise, review of systems are positive for none.   All other systems are reviewed and negative.    PHYSICAL EXAM: VS:  There were no vitals taken for this visit. , BMI There is no height or weight on file to calculate BMI. GENERAL:  Well appearing HEENT: Pupils equal round and reactive, fundi not visualized, oral mucosa unremarkable NECK:  No jugular venous distention, waveform within normal limits, carotid upstroke brisk and symmetric, no bruits LUNGS:  Clear to auscultation bilaterally HEART:  RRR.  PMI not displaced or sustained,S1 and S2 within normal limits, no S3, no S4, no clicks, no rubs, no murmurs ABD:  Flat, positive bowel sounds normal in frequency in pitch, no bruits, no rebound, no guarding, no midline pulsatile mass, no hepatomegaly, no  splenomegaly EXT:  2 plus pulses throughout, no edema, no cyanosis no clubbing SKIN:  No rashes  no nodules NEURO:  Cranial nerves II through XII grossly intact, motor grossly intact throughout PSYCH:  Cognitively intact, oriented to person place and time   EKG:  EKG is not ordered today.  Echocardiogram 04/30/2018 - Left ventricle: The cavity size was moderately dilated. Wall thickness was normal. Systolic function was severely reduced. The estimated ejection fraction was in the range of 20% to 25%. Diffuse hypokinesis. Features are consistent with a pseudonormal left ventricular filling pattern, with concomitant abnormal relaxation and increased filling pressure (grade 2 diastolic dysfunction). Doppler parameters are consistent with high ventricular filling pressure. - Aortic valve: There was trivial regurgitation. - Mitral valve: There was mild regurgitation. Valve area by pressure half-time: 1.09 cm^2. - Left atrium: The atrium was mildly dilated. - Right ventricle: Pacer wire or catheter noted in right ventricle. Systolic function was reduced.  Cardiac Cath 08/18/2018 Conclusion     Mid LM to Dist LM lesion is 40% stenosed. Cross sectional area 6.7 cm2.  Ost Cx lesion is 50% stenosed. Cross sectional area 5.5 cm2.  Mid LAD lesion is 50% stenosed.  LV end diastolic pressure is normal. LVEDP 12 mm Hg.  There is no aortic valve stenosis.  Prox LAD lesion is 75% stenosed. Severely calcified. Cross sectional area 3.2 and heavily calcified.  A drug-eluting stent was successfully placed using a STENT SYNERGY DES 3X12, postdilated to 3.3 mm.  Post intervention, there is a 0% residual stenosis.    Recommend to resume Apixaban, at currently prescribed dose and frequency, on 08/19/18. Recommend concurrent antiplatelet therapy of Aspirin 81mg  daily for 1 monthand Clopidogrel 75mg  daily for 6 months.    Recent Labs: 09/08/2018: Magnesium  1.8 09/17/2018: ALT 38 09/22/2018: B Natriuretic Peptide 1,184.7 09/23/2018: Hemoglobin 9.5; Platelets 212 09/27/2018: BUN 35; Creatinine, Ser 1.74; Potassium 4.8; Sodium 137    Lipid Panel    Component Value Date/Time   CHOL 159 08/15/2018 0207   TRIG 89 08/15/2018 0207   HDL 32 (L) 08/15/2018 0207   CHOLHDL 5.0 08/15/2018 0207   VLDL 18 08/15/2018 0207   LDLCALC 109 (H) 08/15/2018 0207      Wt Readings from Last 3 Encounters:  09/27/18 244 lb 4.8 oz (110.8 kg)  09/20/18 253 lb 3.2 oz (114.9 kg)  09/10/18 248 lb 10.9 oz (112.8 kg)    Echo 08/22/15: Study Conclusions  - Left ventricle: The cavity size was severely dilated. Systolic function was severely reduced. The estimated ejection fraction was in the range of 15-20%. Diffuse hypokinesis. - Aortic valve: There was trivial regurgitation. - Mitral valve: There was mild regurgitation. - Right ventricle: Systolic function was normal. - Right atrium: The atrium was normal in size. - Tricuspid valve: There was trivial regurgitation.   Other studies Reviewed: Additional studies/ records that were reviewed today include: . Review of the above records demonstrates:  Please see elsewhere in the note.     ASSESSMENT AND PLAN:  # Chronic systolic and diastolic heart failure: Mr. Martis is doing well now.  However he has had recurrent hospitalizations and ED visits for volume overload.  We discussed the possibility of either taking torsemide 40 mg when he has an exacerbation or just switching to 40 mill grams daily.  We will try 40 mg daily.  Check a basic metabolic panel in 1 week.  ICD is followed by the [place].  Continue carvedilol, hydralazine  # CAD s/p PCI: Mr. Najjar had a drug-eluting stent placed 07/2018.  He is no longer on aspirin.  He will  continue clopidogrel for 5 more months.  Continue carvedilol and rosuvastatin.    # Hyperlipidemia: Resume statin was started in the hospital.  He will get lipids and a CMP in  1 week.  Goal LDL is less than 70.  # ICD: No ICD firing.  He would like to have his device management her clinic once he is cleared by the [place].  # Paroxysmal atrial fibrillation: Hematuria on Xarelto.  He is doing well on Eliquis.  He has had recurrent atrial fibrillation since his probe operative episode.  Continue carvedilol and amiodarone. This patients CHA2DS2-VASc Score and unadjusted Ischemic Stroke Rate (% per year) is equal to 7.2 % stroke rate/year from a score of 5  Above score calculated as 1 point each if present [CHF, HTN, DM, Vascular=MI/PAD/Aortic Plaque, Age if 65-74, or Male] Above score calculated as 2 points each if present [Age > 75, or Stroke/TIA/TE]    Current medicines are reviewed at length with the patient today.  The patient does not have concerns regarding medicines.  The following changes have been made:  no change  Labs/ tests ordered today include:   No orders of the defined types were placed in this encounter.    Disposition:   FU with Kyngston Pickelsimer C. Duke Salvia, MD in 6 months.    Signed, [name], MD  10/05/2018 1:34 PM    [facility]

## 2018-10-16 ENCOUNTER — Ambulatory Visit (INDEPENDENT_AMBULATORY_CARE_PROVIDER_SITE_OTHER): Payer: Non-veteran care | Admitting: Podiatry

## 2018-10-16 ENCOUNTER — Encounter: Payer: Self-pay | Admitting: Podiatry

## 2018-10-16 DIAGNOSIS — B351 Tinea unguium: Secondary | ICD-10-CM

## 2018-10-16 DIAGNOSIS — M79676 Pain in unspecified toe(s): Secondary | ICD-10-CM | POA: Diagnosis not present

## 2018-10-16 DIAGNOSIS — E0843 Diabetes mellitus due to underlying condition with diabetic autonomic (poly)neuropathy: Secondary | ICD-10-CM

## 2018-10-18 ENCOUNTER — Telehealth: Payer: Self-pay | Admitting: Cardiovascular Disease

## 2018-10-18 NOTE — Telephone Encounter (Signed)
Spoke with patient and he is having swelling in his legs but no weight gain. Patient needed follow up from ED 10/25 scheduled appointment for tomorrow with DNP Dalbert Mayotte

## 2018-10-18 NOTE — Telephone Encounter (Signed)
New Message   Pt c/o swelling: STAT is pt has developed SOB within 24 hours  1) How much weight have you gained and in what time span? No weight gain  2) If swelling, where is the swelling located? Feet and legs   3) Are you currently taking a fluid pill? yes  4) Are you currently SOB? No   5) Do you have a log of your daily weights (if so, list)? No  6) Have you gained 3 pounds in a day or 5 pounds in a week? none  7) Have you traveled recently? No   Patient states that he monitors his weight daily and does not show any weight gain. He is just swelling and retaining fluid. Please call to discuss.

## 2018-10-18 NOTE — Progress Notes (Signed)
   SUBJECTIVE Patient with a history of diabetes mellitus presents to office today complaining of elongated, thickened nails that cause pain while ambulating in shoes. He is unable to trim his own nails. Patient is here for further evaluation and treatment.   Past Medical History:  Diagnosis Date  . Atrial fibrillation (HCC)   . Cardiomyopathy (HCC)   . Cat scratch fever    removed mass on right side neck  . Cataracts, bilateral   . CHF (congestive heart failure) (HCC)   . Chronic kidney disease   . Corns and callosities   . Diabetes mellitus without complication (HCC)    TYPE 2  . Dyspnea   . Erythema intertrigo   . Flat foot   . Gout   . High cholesterol   . Hx of urethral stricture   . Hypertension   . Kidney stones   . Morbid obesity (HCC)   . Nonischemic cardiomyopathy (HCC)    a. ? 2009 Cath in [place] - nl cors per pt;  b. 09/2013 Echo: EF 25-30%, sev diff HK.  . Obesity   . Osteoarthritis   . PAF (paroxysmal atrial fibrillation) (HCC)    a. post-op hip in 2014 - prev on xarelto.  . Renal insufficiency     OBJECTIVE General Patient is awake, alert, and oriented x 3 and in no acute distress. Derm Skin is dry and supple bilateral. Negative open lesions or macerations. Remaining integument unremarkable. Nails are tender, long, thickened and dystrophic with subungual debris, consistent with onychomycosis, 1-5 bilateral. No signs of infection noted. Vasc  DP and PT pedal pulses palpable bilaterally. Temperature gradient within normal limits.  Neuro Epicritic and protective threshold sensation diminished bilaterally.  Musculoskeletal Exam No symptomatic pedal deformities noted bilateral. Muscular strength within normal limits.  ASSESSMENT 1. Diabetes Mellitus w/ peripheral neuropathy 2. Onychomycosis of nail due to dermatophyte bilateral 3. Pain in foot bilateral  PLAN OF CARE 1. Patient evaluated today. 2. Instructed to maintain good pedal hygiene and foot care. Stressed  importance of controlling blood sugar.  3. Mechanical debridement of nails 1-5 bilaterally performed using a nail nipper. Filed with dremel without incident.  4. Return to clinic in 3 mos.      M. , DPM [facility]  Dr.  M. , DPM    [address]                                        [place], [place] [address]                Office [phone]  Fax [phone]      

## 2018-10-18 NOTE — Telephone Encounter (Signed)
Appointment tomorrow with DNP Dalbert Mayotte

## 2018-10-18 NOTE — Telephone Encounter (Signed)
Returned call to patient no answer.LMTC. 

## 2018-10-19 ENCOUNTER — Inpatient Hospital Stay (HOSPITAL_COMMUNITY)
Admission: EM | Admit: 2018-10-19 | Discharge: 2018-10-22 | DRG: 291 | Disposition: A | Discharge status: Home / Self Care | Payer: Medicare Other | Attending: Internal Medicine | Admitting: Internal Medicine

## 2018-10-19 ENCOUNTER — Encounter: Payer: Self-pay | Admitting: Adult Health

## 2018-10-19 ENCOUNTER — Emergency Department (HOSPITAL_COMMUNITY): Payer: Medicare Other

## 2018-10-19 ENCOUNTER — Encounter (HOSPITAL_COMMUNITY): Payer: Self-pay | Admitting: Emergency Medicine

## 2018-10-19 ENCOUNTER — Ambulatory Visit (INDEPENDENT_AMBULATORY_CARE_PROVIDER_SITE_OTHER): Payer: No Typology Code available for payment source | Admitting: Adult Health

## 2018-10-19 ENCOUNTER — Other Ambulatory Visit: Payer: Self-pay

## 2018-10-19 VITALS — BP 103/70 | HR 70 | Ht 67.0 in | Wt 239.0 lb

## 2018-10-19 DIAGNOSIS — Z880 Allergy status to penicillin: Secondary | ICD-10-CM

## 2018-10-19 DIAGNOSIS — I2510 Atherosclerotic heart disease of native coronary artery without angina pectoris: Secondary | ICD-10-CM | POA: Diagnosis not present

## 2018-10-19 DIAGNOSIS — I5042 Chronic combined systolic (congestive) and diastolic (congestive) heart failure: Secondary | ICD-10-CM

## 2018-10-19 DIAGNOSIS — M25511 Pain in right shoulder: Secondary | ICD-10-CM | POA: Diagnosis present

## 2018-10-19 DIAGNOSIS — I5043 Acute on chronic combined systolic (congestive) and diastolic (congestive) heart failure: Secondary | ICD-10-CM | POA: Diagnosis present

## 2018-10-19 DIAGNOSIS — I130 Hypertensive heart and chronic kidney disease with heart failure and stage 1 through stage 4 chronic kidney disease, or unspecified chronic kidney disease: Secondary | ICD-10-CM | POA: Diagnosis not present

## 2018-10-19 DIAGNOSIS — F419 Anxiety disorder, unspecified: Secondary | ICD-10-CM | POA: Diagnosis present

## 2018-10-19 DIAGNOSIS — I480 Paroxysmal atrial fibrillation: Secondary | ICD-10-CM

## 2018-10-19 DIAGNOSIS — R05 Cough: Secondary | ICD-10-CM | POA: Diagnosis present

## 2018-10-19 DIAGNOSIS — Z7901 Long term (current) use of anticoagulants: Secondary | ICD-10-CM

## 2018-10-19 DIAGNOSIS — N183 Chronic kidney disease, stage 3 (moderate): Secondary | ICD-10-CM | POA: Diagnosis present

## 2018-10-19 DIAGNOSIS — Z79899 Other long term (current) drug therapy: Secondary | ICD-10-CM

## 2018-10-19 DIAGNOSIS — I10 Essential (primary) hypertension: Secondary | ICD-10-CM | POA: Diagnosis not present

## 2018-10-19 DIAGNOSIS — M7989 Other specified soft tissue disorders: Secondary | ICD-10-CM | POA: Diagnosis present

## 2018-10-19 DIAGNOSIS — Z7984 Long term (current) use of oral hypoglycemic drugs: Secondary | ICD-10-CM

## 2018-10-19 DIAGNOSIS — Z87442 Personal history of urinary calculi: Secondary | ICD-10-CM

## 2018-10-19 DIAGNOSIS — G8929 Other chronic pain: Secondary | ICD-10-CM | POA: Diagnosis present

## 2018-10-19 DIAGNOSIS — N179 Acute kidney failure, unspecified: Secondary | ICD-10-CM | POA: Diagnosis present

## 2018-10-19 DIAGNOSIS — M545 Low back pain: Secondary | ICD-10-CM | POA: Diagnosis present

## 2018-10-19 DIAGNOSIS — Z87891 Personal history of nicotine dependence: Secondary | ICD-10-CM

## 2018-10-19 DIAGNOSIS — E7800 Pure hypercholesterolemia, unspecified: Secondary | ICD-10-CM | POA: Diagnosis present

## 2018-10-19 DIAGNOSIS — E1122 Type 2 diabetes mellitus with diabetic chronic kidney disease: Secondary | ICD-10-CM | POA: Diagnosis present

## 2018-10-19 DIAGNOSIS — M109 Gout, unspecified: Secondary | ICD-10-CM | POA: Diagnosis present

## 2018-10-19 DIAGNOSIS — I509 Heart failure, unspecified: Secondary | ICD-10-CM

## 2018-10-19 DIAGNOSIS — R059 Cough, unspecified: Secondary | ICD-10-CM

## 2018-10-19 DIAGNOSIS — Z955 Presence of coronary angioplasty implant and graft: Secondary | ICD-10-CM

## 2018-10-19 DIAGNOSIS — Z6837 Body mass index (BMI) 37.0-37.9, adult: Secondary | ICD-10-CM

## 2018-10-19 DIAGNOSIS — I4891 Unspecified atrial fibrillation: Secondary | ICD-10-CM | POA: Diagnosis present

## 2018-10-19 DIAGNOSIS — Z8249 Family history of ischemic heart disease and other diseases of the circulatory system: Secondary | ICD-10-CM

## 2018-10-19 DIAGNOSIS — R7989 Other specified abnormal findings of blood chemistry: Secondary | ICD-10-CM

## 2018-10-19 DIAGNOSIS — Z96642 Presence of left artificial hip joint: Secondary | ICD-10-CM | POA: Diagnosis present

## 2018-10-19 DIAGNOSIS — Z7902 Long term (current) use of antithrombotics/antiplatelets: Secondary | ICD-10-CM

## 2018-10-19 LAB — CBC
HEMATOCRIT: 34.9 % — AB (ref 39.0–52.0)
Hemoglobin: 10.8 g/dL — ABNORMAL LOW (ref 13.0–17.0)
MCH: 28.9 pg (ref 26.0–34.0)
MCHC: 30.9 g/dL (ref 30.0–36.0)
MCV: 93.3 fL (ref 80.0–100.0)
NRBC: 0 % (ref 0.0–0.2)
PLATELETS: 261 10*3/uL (ref 150–400)
RBC: 3.74 MIL/uL — AB (ref 4.22–5.81)
RDW: 16.4 % — AB (ref 11.5–15.5)
WBC: 5.5 10*3/uL (ref 4.0–10.5)

## 2018-10-19 LAB — COMPREHENSIVE METABOLIC PANEL
ALBUMIN: 2.8 g/dL — AB (ref 3.5–5.0)
ALK PHOS: 64 U/L (ref 38–126)
ALT: 33 U/L (ref 0–44)
ANION GAP: 11 (ref 5–15)
AST: 36 U/L (ref 15–41)
BUN: 28 mg/dL — ABNORMAL HIGH (ref 8–23)
CHLORIDE: 103 mmol/L (ref 98–111)
CO2: 21 mmol/L — AB (ref 22–32)
Calcium: 8.6 mg/dL — ABNORMAL LOW (ref 8.9–10.3)
Creatinine, Ser: 2.25 mg/dL — ABNORMAL HIGH (ref 0.61–1.24)
GFR calc non Af Amer: 25 mL/min — ABNORMAL LOW (ref >60)
GFR, EST AFRICAN AMERICAN: 30 mL/min — AB (ref >60)
GLUCOSE: 127 mg/dL — AB (ref 70–99)
Potassium: 3.7 mmol/L (ref 3.5–5.1)
SODIUM: 135 mmol/L (ref 135–145)
Total Bilirubin: 0.7 mg/dL (ref 0.3–1.2)
Total Protein: 7.5 g/dL (ref 6.5–8.1)

## 2018-10-19 LAB — I-STAT TROPONIN, ED: Troponin i, poc: 0.02 ng/mL (ref 0.00–0.08)

## 2018-10-19 LAB — BRAIN NATRIURETIC PEPTIDE: B Natriuretic Peptide: 1480.9 pg/mL — ABNORMAL HIGH (ref 0.0–100.0)

## 2018-10-19 MED ORDER — CLOPIDOGREL BISULFATE 75 MG PO TABS
75.0000 mg | ORAL_TABLET | Freq: Every day | ORAL | Status: DC
  Administered 2018-10-20 – 2018-10-22 (×3): 75 mg via ORAL
  Filled 2018-10-19 (×3): qty 1

## 2018-10-19 MED ORDER — ACETAMINOPHEN 325 MG PO TABS: 650.0000 mg | ORAL_TABLET | Freq: Four times a day (QID) | ORAL | Status: DC | PRN

## 2018-10-19 MED ORDER — APIXABAN 2.5 MG PO TABS
2.5000 mg | ORAL_TABLET | Freq: Two times a day (BID) | ORAL | Status: DC
  Administered 2018-10-20 – 2018-10-22 (×6): 2.5 mg via ORAL
  Filled 2018-10-19 (×7): qty 1

## 2018-10-19 MED ORDER — IPRATROPIUM-ALBUTEROL 0.5-2.5 (3) MG/3ML IN SOLN: 3.0000 mL | RESPIRATORY_TRACT | Status: DC | PRN

## 2018-10-19 MED ORDER — HYDROCODONE-ACETAMINOPHEN 5-325 MG PO TABS: 1.0000 | ORAL_TABLET | Freq: Two times a day (BID) | ORAL | Status: DC | PRN

## 2018-10-19 MED ORDER — NITROGLYCERIN 0.4 MG SL SUBL: 0.4000 mg | SUBLINGUAL_TABLET | SUBLINGUAL | Status: DC | PRN

## 2018-10-19 MED ORDER — INSULIN ASPART 100 UNIT/ML ~~LOC~~ SOLN
0.0000 [IU] | Freq: Three times a day (TID) | SUBCUTANEOUS | Status: DC
  Administered 2018-10-21: 2 [IU] via SUBCUTANEOUS

## 2018-10-19 MED ORDER — FLUTICASONE FUROATE-VILANTEROL 100-25 MCG/INH IN AEPB
1.0000 | INHALATION_SPRAY | Freq: Every day | RESPIRATORY_TRACT | Status: DC
  Administered 2018-10-20 – 2018-10-22 (×3): 1 via RESPIRATORY_TRACT
  Filled 2018-10-19: qty 28

## 2018-10-19 MED ORDER — LIDOCAINE 5 % EX PTCH
1.0000 | MEDICATED_PATCH | CUTANEOUS | Status: DC
  Administered 2018-10-19 – 2018-10-21 (×3): 1 via TRANSDERMAL
  Filled 2018-10-19 (×3): qty 1

## 2018-10-19 MED ORDER — BENZONATATE 100 MG PO CAPS: 100.0000 mg | ORAL_CAPSULE | Freq: Three times a day (TID) | ORAL | 1 refills | Status: DC | PRN

## 2018-10-19 MED ORDER — BENZONATATE 100 MG PO CAPS
100.0000 mg | ORAL_CAPSULE | Freq: Three times a day (TID) | ORAL | Status: DC | PRN
  Administered 2018-10-20 – 2018-10-22 (×6): 100 mg via ORAL
  Filled 2018-10-19 (×6): qty 1

## 2018-10-19 MED ORDER — FINASTERIDE 5 MG PO TABS
5.0000 mg | ORAL_TABLET | Freq: Every day | ORAL | Status: DC
  Administered 2018-10-20 – 2018-10-22 (×3): 5 mg via ORAL
  Filled 2018-10-19 (×3): qty 1

## 2018-10-19 MED ORDER — AMIODARONE HCL 200 MG PO TABS
200.0000 mg | ORAL_TABLET | Freq: Two times a day (BID) | ORAL | Status: DC
  Administered 2018-10-20 – 2018-10-22 (×6): 200 mg via ORAL
  Filled 2018-10-19 (×6): qty 1

## 2018-10-19 MED ORDER — SENNOSIDES-DOCUSATE SODIUM 8.6-50 MG PO TABS
1.0000 | ORAL_TABLET | Freq: Two times a day (BID) | ORAL | Status: DC
  Administered 2018-10-20 – 2018-10-22 (×5): 1 via ORAL
  Filled 2018-10-19 (×5): qty 1

## 2018-10-19 MED ORDER — HYDROCODONE-ACETAMINOPHEN 5-325 MG PO TABS
1.0000 | ORAL_TABLET | Freq: Once | ORAL | Status: AC
  Administered 2018-10-19: 1 via ORAL
  Filled 2018-10-19: qty 1

## 2018-10-19 MED ORDER — TORSEMIDE 20 MG PO TABS: ORAL_TABLET | ORAL | 3 refills | Status: DC

## 2018-10-19 MED ORDER — BENZONATATE 100 MG PO CAPS
100.0000 mg | ORAL_CAPSULE | Freq: Once | ORAL | Status: AC
  Administered 2018-10-19: 100 mg via ORAL
  Filled 2018-10-19: qty 1

## 2018-10-19 MED ORDER — DICLOFENAC SODIUM 1 % TD GEL: 4.0000 g | Freq: Four times a day (QID) | TRANSDERMAL | Status: DC | PRN

## 2018-10-19 MED ORDER — SERTRALINE HCL 25 MG PO TABS
25.0000 mg | ORAL_TABLET | Freq: Every day | ORAL | Status: DC
  Administered 2018-10-20 – 2018-10-22 (×3): 25 mg via ORAL
  Filled 2018-10-19 (×3): qty 1

## 2018-10-19 MED ORDER — FUROSEMIDE 10 MG/ML IJ SOLN: 40.0000 mg | Freq: Two times a day (BID) | INTRAMUSCULAR | Status: DC

## 2018-10-19 MED ORDER — METOPROLOL SUCCINATE ER 100 MG PO TB24
100.0000 mg | ORAL_TABLET | Freq: Every day | ORAL | Status: DC
  Administered 2018-10-20 – 2018-10-22 (×3): 100 mg via ORAL
  Filled 2018-10-19 (×3): qty 1

## 2018-10-19 MED ORDER — SODIUM CHLORIDE 0.9% FLUSH
3.0000 mL | Freq: Two times a day (BID) | INTRAVENOUS | Status: DC
  Administered 2018-10-20 – 2018-10-22 (×6): 3 mL via INTRAVENOUS

## 2018-10-19 MED ORDER — FUROSEMIDE 10 MG/ML IJ SOLN
40.0000 mg | Freq: Once | INTRAMUSCULAR | Status: AC
  Administered 2018-10-19: 40 mg via INTRAVENOUS
  Filled 2018-10-19: qty 4

## 2018-10-19 MED ORDER — ACETAMINOPHEN 650 MG RE SUPP: 650.0000 mg | Freq: Four times a day (QID) | RECTAL | Status: DC | PRN

## 2018-10-19 MED ORDER — FUROSEMIDE 10 MG/ML IJ SOLN
40.0000 mg | Freq: Two times a day (BID) | INTRAMUSCULAR | Status: DC
  Administered 2018-10-20 (×2): 40 mg via INTRAVENOUS
  Filled 2018-10-19 (×2): qty 4

## 2018-10-19 NOTE — H&P (Addendum)
Date: 10/19/2018               Patient Name:  Ryan Macdonald MRN: [id number]  DOB: 14-Mar-1934 Age / Sex: 84 y.o., male   [facility]P: [facility]         Medical Service: Internal Medicine Teaching Service         Attending Physician: Dr. Earl Lagos, MD    First Contact: Dr. Criss Alvine Pager: [phone]  Second Contact: Dr. Caron Presume Pager: [phone]       After Hours (After 5p/  First Contact Pager: [phone]  weekends / holidays): Second Contact Pager: [phone]   Chief Complaint: Leg swelling  History of Present Illness: Ryan Macdonald is an 84 y.o male with a PMHx of systolic HF, DM, HTN, CAD s/p stents presenting with bilateral lower extremity swelling. He started noticing worsening LE swelling over the past 2-3 days. He went to see his cardiologist this morning who recommended increasing torsemide to 40 mg for 3 days and then decreasing to 20 mg alternating with 40 mg daily. He went home and took an additional pill but felt that he wasn't having an appropriate response. He said in the past lasix has worked well for him. Denies any SOB, fevers, chest pain, orthopnea, abdominal pain or distension, or difficulty urinating. He complained of a dry cough he has had for the past couple of months and chronic right shoulder and back pain.    Meds:  Current Meds  Medication Sig  . allopurinol (ZYLOPRIM) 100 MG tablet Take 200 mg by mouth daily as needed (for gout symptoms). To prevent gout  . amiodarone (PACERONE) 200 MG tablet Take 1 tablet (200 mg total) by mouth 2 (two) times daily. (Patient taking differently: Take 200 mg by mouth 2 (two) times daily. For heart rhythm control)  . apixaban (ELIQUIS) 2.5 MG TABS tablet Take 1 tablet (2.5 mg total) by mouth 2 (two) times daily. (Patient taking differently: Take 2.5 mg by mouth 2 (two) times daily. Caution - blood thinner)  . benzonatate (TESSALON) 100 MG capsule Take 1 capsule (100 mg total) by mouth 3 (three) times daily as needed for  cough.  . clopidogrel (PLAVIX) 75 MG tablet Take 1 tablet (75 mg total) by mouth daily with breakfast.  . cyanocobalamin 1000 MCG tablet Take 1,000 mcg by mouth daily.   . diclofenac sodium (VOLTAREN) 1 % GEL Apply 4 g topically 4 (four) times daily. (Patient taking differently: Apply 4 g topically 4 (four) times daily as needed (for pain in the back and both knees). )  . finasteride (PROSCAR) 5 MG tablet Take 5 mg by mouth daily.   . fluticasone furoate-vilanterol (BREO ELLIPTA) 100-25 MCG/INH AEPB Inhale 1 puff into the lungs daily.  Marland Kitchen glipiZIDE (GLUCOTROL) 5 MG tablet Take 5 mg by mouth daily before breakfast.  . guaiFENesin-codeine (GUAIATUSSIN AC) 100-10 MG/5ML syrup Take 5 mLs by mouth 3 (three) times daily as needed for cough.  Marland Kitchen HYDROcodone-acetaminophen (NORCO/VICODIN) 5-325 MG tablet Take 2 tablets by mouth every 4 (four) hours as needed. (Patient taking differently: Take 1 tablet by mouth 2 (two) times daily as needed (for pain). )  . ipratropium-albuterol (DUONEB) 0.5-2.5 (3) MG/3ML SOLN Take 3 mLs by nebulization every 4 (four) hours as needed. (Patient taking differently: Take 3 mLs by nebulization every 4 (four) hours as needed (shortness of breath). )  . ketoconazole (NIZORAL) 2 % shampoo Apply 1 application topically 2 (two) times a week.  . metoprolol  succinate (TOPROL XL) 100 MG 24 hr tablet Take 1 tablet (100 mg total) by mouth daily.  . nitroGLYCERIN (NITROSTAT) 0.4 MG SL tablet Place 1 tablet (0.4 mg total) under the tongue every 5 (five) minutes x 3 doses as needed for chest pain.  Marland Kitchen PRESCRIPTION MEDICATION Apply 1 application topically daily as needed (pain). ThermaGel  . senna-docusate (SENOKOT S) 8.6-50 MG tablet Take 1 tablet by mouth 2 (two) times daily.  . sertraline (ZOLOFT) 25 MG tablet Take 25 mg by mouth daily.   Marland Kitchen torsemide (DEMADEX) 20 MG tablet Take 1 tablets by mouth daily until see your cardiologist this week. Meanwhile, If your weight up to 3 lb in a day of 5 lb  in a week, takes extra pill and inform your primary care or cardiologist.     Allergies: Allergies as of 10/19/2018 - Review Complete 10/19/2018  Allergen Reaction Noted  . Lisinopril Swelling 10/11/2013  . Xarelto [rivaroxaban] Other (See Comments) 02/13/2018   Past Medical History:  Diagnosis Date  . Atrial fibrillation (HCC)   . Cardiomyopathy (HCC)   . Cat scratch fever    removed mass on right side neck  . Cataracts, bilateral   . CHF (congestive heart failure) (HCC)   . Chronic kidney disease   . Corns and callosities   . Diabetes mellitus without complication (HCC)    TYPE 2  . Dyspnea   . Erythema intertrigo   . Flat foot   . Gout   . High cholesterol   . Hx of urethral stricture   . Hypertension   . Kidney stones   . Morbid obesity (HCC)   . Nonischemic cardiomyopathy (HCC)    a. ? 2009 Cath in [place] - nl cors per pt;  b. 09/2013 Echo: EF 25-30%, sev diff HK.  . Obesity   . Osteoarthritis   . PAF (paroxysmal atrial fibrillation) (HCC)    a. post-op hip in 2014 - prev on xarelto.  . Renal insufficiency     Family History:  Mother- cerebral hemorrhage Father- Lung disease, chronic smoker  Family History  Problem Relation Age of Onset  . Heart disease Mother   . Heart Problems Maternal Grandmother   . Other Unknown        negative for premature CAD    Social History:  Denies any current tobacco use. Distant history of marijuana use. Drinks wine and beer socially/occassionally.   Social History   Socioeconomic History  . Marital status: Married    Spouse name: Not on file  . Number of children: Not on file  . Years of education: Not on file  . Highest education level: Not on file  Occupational History  . Occupation: Retired    Comment: Former Patent examiner  . Financial resource strain: Not on file  . Food insecurity:    Worry: Not on file    Inability: Not on file  . Transportation needs:    Medical: Not on file    Non-medical: Not on  file  Tobacco Use  . Smoking status: Former Smoker    Packs/day: 1.00    Years: 15.00    Pack years: 15.00    Types: Cigarettes, Cigars  . Smokeless tobacco: Former Neurosurgeon  . Tobacco comment: quit smoking ~ 50 yr ago  Substance and Sexual Activity  . Alcohol use: No    Alcohol/week: 2.0 standard drinks    Types: 2 Cans of beer per week    Comment: rare beer.  Marland Kitchen  Drug use: No  . Sexual activity: Yes  Lifestyle  . Physical activity:    Days per week: Not on file    Minutes per session: Not on file  . Stress: Not on file  Relationships  . Social connections:    Talks on phone: Not on file    Gets together: Not on file    Attends religious service: Not on file    Active member of club or organization: Not on file    Attends meetings of clubs or organizations: Not on file    Relationship status: Not on file  . Intimate partner violence:    Fear of current or ex partner: No    Emotionally abused: No    Physically abused: No    Forced sexual activity: No  Other Topics Concern  . Not on file  Social History Narrative   Lives in [place] with wife. Moved from [place] (2013). Does not routinely exercise.    Review of Systems: A complete ROS was negative except as per HPI.   Physical Exam: Blood pressure 124/64, pulse 70, temperature 98.3 F (36.8 C), temperature source Oral, resp. rate 14, height 5\' 7"  (1.702 m), weight 108.9 kg, SpO2 94 %.  Physical Exam  Constitutional: He is oriented to person, place, and time and well-developed, well-nourished, and in no distress.  Eyes: Conjunctivae and EOM are normal. Right eye exhibits no discharge. Left eye exhibits no discharge.  Neck: No JVD present.  Cardiovascular: Normal rate, regular rhythm and normal heart sounds.  No murmur heard. Pulmonary/Chest: Effort normal. No respiratory distress. He has no wheezes.  Bibasilar crackles  Abdominal: Soft. Bowel sounds are normal. He exhibits no distension. There is no tenderness.    Musculoskeletal: He exhibits edema.  2+ pitting edema  Neurological: He is alert and oriented to person, place, and time.  Skin: Skin is warm and dry.    EKG: personally reviewed my interpretation is unchanged from prior EKGs  CXR: personally reviewed my interpretation is no acute cardiopulmonary disease, stable AICD   Assessment & Plan by Problem: Active Problems:   Acute on chronic heart failure [facility])  Mr. Mikelson is an 84 y.o male with a PMHx of systolic HF, DM, HTN, CAD s/p stents presenting with bilateral lower extremity swelling.  Acute on Chronic Systolic CHF Patient presented with bilateral LE 2+ pitting edema for the past 2-3 days. Last echo in 6/19 showed LV  EF 20-25%, grade 2 diastolic dysfunction, mild mitral valve regurgitation and mildly dilated left atrium. He takes torsemide 20 mg qd. He saw cardiology this morning and they recommended increasing torsemide to 40 mg for three days and then decreasing to 20 mg alternating with 40 mg daily. He felt that he was not responding well to the diuretics so came to the ED. His dry weight from last admission was 244 lbs; weight on admission 240 lbs. Reported he has lost 40 lbs this year from diet. BNP 1480. Given 40 mg IV in the ED, will continue this BID.  -continue furosemide 40 mg IV bid -strict ins and outs -daily weights -f/u bmp -telemetry   AKI Cr on admission 2.25; 1.74 3 weeks ago. BUN 28. Most likely in the setting of acute on chronic CHF. -f/u bmp  CAD  Patient had a left heart catheterization and coronary angiogram on 08/18/18 with 75% stenosis of proximal LAD with DES, residual stenosis in the LM and 50 % stenosis in the ostial Cx of 20-25%.  -home  medications clopidogrel 75 mg, metoprolol 100 mg qd, nitroglycerin prn  Atrial Fibrillation -home medication amiodarone 200 mg bid, eliquis 2.5 mg bid   HTN Currently normotensive, on beta blockers and diuretics.  DM Home medication glipizide 5 mg qd -SSI  moderate  Anxiety -home medication sertraline 25 mg qd   Chronic back pain -home medication hydrocodone-acetaminophen 5-325 mg bid   Diet: heart healthy/carb modified  DVT Prophylaxis: eliquis  Full code  Dispo: Admit patient to Observation with expected length of stay less than 2 midnights.  SignedJaci Standard, DO 10/19/2018, 8:47 PM  Pager: [phone]

## 2018-10-19 NOTE — Patient Instructions (Signed)
Medication Instructions:  TAKE TORSEMIDE 40MG  x3DAYS, Sunday 20MG , Monday 40MG  ALTERNATE 20MG  THEN 40MG  DAILY  If you need a refill on your cardiac medications before your next appointment, please call your pharmacy.  Labwork: BMET 3 DAYS BEFORE DR [place]land FOLLOW-UP APPT (11-06-2018). HERE IN OUR OFFICE AT [facility]  Take the provided lab slips with you to the lab for your blood draw.   You will NOT need to fast   If you have labs (blood work) drawn today and your tests are completely normal, you will receive your results only by: Marland Kitchen MyChart Message (if you have MyChart) OR . A paper copy in the mail If you have any lab test that is abnormal or we need to change your treatment, we will call you to review the results.  Special Instructions: MAKE SURE TO TAKE YOU ZOLOFT EVERY DAY AT BEDTIME  Follow-Up: You will need a follow up appointment in KEEP SCHEDULED APPT WITH DR [place],  [name], DNP, AACC or one of the following Advanced Practice Providers on your designated Care Team:    Corine Shelter, [place]  At [facility], you and your health needs are our priority.  As part of our continuing mission to provide you with exceptional heart care, we have created designated Provider Care Teams.  These Care Teams include your primary Cardiologist (physician) and Advanced Practice Providers (APPs -  Physician Assistants and Nurse Practitioners) who all work together to provide you with the care you need, when you need it.  Thank you for choosing [facility] at [facility]!!

## 2018-10-19 NOTE — ED Triage Notes (Signed)
Per pt he has been having more swelling in his legs today starting at 10:00 am. Pt takes lasix and it seems not to be working. Pt has hx of chf,pacemaker, stents, edema. 135/57, 64 p, 18 rr, cbg 137, 99% ra.

## 2018-10-19 NOTE — ED Notes (Signed)
Attempted to call report

## 2018-10-19 NOTE — ED Provider Notes (Signed)
[name] EMERGENCY DEPARTMENT Provider Note   CSN: [id number] Arrival date & time: 10/19/18  1530     History   Chief Complaint Chief Complaint  Patient presents with  . Leg Swelling    HPI Ryan Macdonald is a 84 y.o. male.  Patient is a 84 y.o. Male with PMHx of CHF, DM, HTN, CAD presenting for bilateral lower extremity swelling. Patient states that he was seen at his cardiologist this morning, went home and took his diuretic (1 pill, 20mg ), felt like he wasn't urinating enough so came to the ED. Patient states that he has too much swelling on his lower extremities and is concerned that the diuretics were not working. Patient states that he has no shortness of breath, is not using his supplemental O2 at home, has no difficulty getting around or increase in orthopnea. Patient denies chest pain, nausea/vomiting. Complaining of a cough which is says is chronic and R shoulder pain/lower back pain.   The history is provided by the patient. No language interpreter was used.    Past Medical History:  Diagnosis Date  . Atrial fibrillation (HCC)   . Cardiomyopathy (HCC)   . Cat scratch fever    removed mass on right side neck  . Cataracts, bilateral   . CHF (congestive heart failure) (HCC)   . Chronic kidney disease   . Corns and callosities   . Diabetes mellitus without complication (HCC)    TYPE 2  . Dyspnea   . Erythema intertrigo   . Flat foot   . Gout   . High cholesterol   . Hx of urethral stricture   . Hypertension   . Kidney stones   . Morbid obesity (HCC)   . Nonischemic cardiomyopathy (HCC)    a. ? 2009 Cath in [place] - nl cors per pt;  b. 09/2013 Echo: EF 25-30%, sev diff HK.  . Obesity   . Osteoarthritis   . PAF (paroxysmal atrial fibrillation) (HCC)    a. post-op hip in 2014 - prev on xarelto.  . Renal insufficiency     Patient Active Problem List   Diagnosis Date Noted  . Pulmonary emphysema (HCC)   . Chronic respiratory failure with  hypoxia (HCC)   . Morbid obesity (HCC) 09/09/2018  . URI (upper respiratory infection) 08/31/2018  . UTI (urinary tract infection) 08/31/2018  . Acute on chronic systolic (congestive) heart failure (HCC) 08/28/2018  . CHF (congestive heart failure) (HCC) 08/14/2018  . Acute on chronic combined systolic and diastolic congestive heart failure (HCC) 07/18/2018  . First degree AV block   . Shortness of breath 04/29/2018  . Acute HFrEF (heart failure with reduced ejection fraction) (HCC) 04/29/2018  . Acute respiratory failure with hypoxia (HCC) 09/27/2017  . CHF exacerbation (HCC) 09/27/2017  . Renal insufficiency   . Acute on chronic systolic CHF (congestive heart failure) (HCC) 07/08/2017  . Left knee pain 07/08/2017  . Acute bronchitis 05/10/2017  . Acute congestive heart failure (HCC) 04/14/2017  . Elevated troponin 04/14/2017  . Lactic acidosis   . Acute on chronic systolic heart failure, NYHA class 2 (HCC) 02/03/2017  . Diabetes mellitus type 2 in obese (HCC) 02/03/2017  . CAP (community acquired pneumonia) 02/03/2017  . Lobar pneumonia (HCC)   . Diabetes mellitus with complication (HCC)   . Acute on chronic respiratory failure with hypoxia (HCC)   . Acute on chronic combined systolic and diastolic heart failure (HCC) 05/19/2016  . Depression 05/19/2016  . Gout 05/19/2016  .  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 NSVT (nonsustained ventricular tachycardia) (HCC)   . Chronic renal insufficiency, stage 3 (moderate) (HCC)   . Pulmonary embolism (HCC)   . Acute respiratory failure (HCC)   . Osteoarthritis of left hip 10/25/2013  . Atrial fibrillation (HCC) 10/25/2013  . S/P hip replacement 10/24/2013  . Nonischemic cardiomyopathy (HCC)   . Obesity (BMI 30-39.9)   . Essential hypertension   . Hyperlipidemia     Past Surgical History:  Procedure Laterality Date  . CARDIAC CATHETERIZATION  1980's  . CIRCUMCISION  1940's  . CORONARY STENT  INTERVENTION N/A 08/18/2018   Procedure: CORONARY STENT INTERVENTION;  Surgeon: Corky Crafts, MD;  Location: [facility] INVASIVE CV LAB;  Service: Cardiovascular;  Laterality: N/A;  . CYSTOSCOPY WITH URETHRAL DILATATION  10/23/2013  . CYSTOSCOPY WITH URETHRAL DILATATION N/A 10/23/2013   Procedure: CYSTOSCOPY WITH URETHRAL DILATATION;  Surgeon: Kathi Ludwig, MD;  Location: [facility] OR;  Service: Urology;  Laterality: N/A;  . INCISION AND DRAINAGE OF WOUND Right 1980's   "cat scratch" (10/23/2013)  . INGUINAL HERNIA REPAIR Right 1950's  . INTRAVASCULAR ULTRASOUND/IVUS N/A 08/18/2018   Procedure: Intravascular Ultrasound/IVUS;  Surgeon: Corky Crafts, MD;  Location: [facility] INVASIVE CV LAB;  Service: Cardiovascular;  Laterality: N/A;  . KIDNEY STONE SURGERY Right 1970's; 1983   "twice"  . LEFT HEART CATH AND CORONARY ANGIOGRAPHY N/A 08/18/2018   Procedure: LEFT HEART CATH AND CORONARY ANGIOGRAPHY;  Surgeon: Corky Crafts, MD;  Location: [facility] INVASIVE CV LAB;  Service: Cardiovascular;  Laterality: N/A;  . TONSILLECTOMY AND ADENOIDECTOMY  1940's  . TOTAL HIP ARTHROPLASTY Left 10/23/2013   Procedure: TOTAL HIP ARTHROPLASTY;  Surgeon: Valeria Batman, MD;  Location: [facility] OR;  Service: Orthopedics;  Laterality: Left;        Home Medications    Prior to Admission medications   Medication Sig Start Date End Date Taking? Authorizing Provider  allopurinol (ZYLOPRIM) 100 MG tablet Take 200 mg by mouth daily. To prevent gout    [provider]  amiodarone (PACERONE) 200 MG tablet Take 1 tablet (200 mg total) by mouth 2 (two) times daily. Patient taking differently: Take 200 mg by mouth 2 (two) times daily. For heart rhythm control 02/16/18   Burna Cash, MD  apixaban (ELIQUIS) 2.5 MG TABS tablet Take 1 tablet (2.5 mg total) by mouth 2 (two) times daily. Patient taking differently: Take 2.5 mg by mouth 2 (two) times daily. Caution - blood thinner 05/04/18   Angelita Ingles,  MD  benzonatate (TESSALON) 100 MG capsule Take 1 capsule (100 mg total) by mouth 3 (three) times daily as needed for cough. 10/19/18   Jodelle Gross, NP  chlorpheniramine-HYDROcodone [facility] PENNKINETIC ER) 10-8 MG/5ML SUER Take 5 mLs by mouth every 12 (twelve) hours as needed for cough. 09/20/18   [name], MD  clopidogrel (PLAVIX) 75 MG tablet Take 1 tablet (75 mg total) by mouth daily with breakfast. 08/20/18   Laverda Page B, NP  cyanocobalamin 1000 MCG tablet Take 1,000 mcg by mouth daily.     [provider]  diclofenac sodium (VOLTAREN) 1 % GEL Apply 4 g topically 4 (four) times daily. Patient taking differently: Apply 4 g topically 4 (four) times daily as needed (pain).  07/10/17   Valentino Nose, MD  finasteride (PROSCAR) 5 MG tablet Take 5 mg by mouth daily.     [provider]  fluticasone furoate-vilanterol (BREO ELLIPTA) 100-25 MCG/INH AEPB Inhale 1 puff into the  lungs daily. 05/05/18   Angelita Ingles, MD  glipiZIDE (GLUCOTROL) 5 MG tablet Take 5 mg by mouth daily before breakfast.    [provider]  guaiFENesin-codeine (GUAIATUSSIN AC) 100-10 MG/5ML syrup Take 5 mLs by mouth 3 (three) times daily as needed for cough.    [provider]  HYDROcodone-acetaminophen (NORCO/VICODIN) 5-325 MG tablet Take 2 tablets by mouth every 4 (four) hours as needed. Patient taking differently: Take 1 tablet by mouth 2 (two) times daily as needed for moderate pain.  04/09/16   Isa Rankin, MD  ipratropium-albuterol (DUONEB) 0.5-2.5 (3) MG/3ML SOLN Take 3 mLs by nebulization every 4 (four) hours as needed. Patient taking differently: Take 3 mLs by nebulization every 4 (four) hours as needed (shortness of breath).  05/14/17   Pearson Grippe, MD  ketoconazole (NIZORAL) 2 % shampoo Apply 1 application topically 2 (two) times a week.    [provider]  metoprolol succinate (TOPROL XL) 100 MG 24 hr tablet Take 1 tablet (100 mg total) by mouth  daily. 10/04/18   [name], MD  nitroGLYCERIN (NITROSTAT) 0.4 MG SL tablet Place 1 tablet (0.4 mg total) under the tongue every 5 (five) minutes x 3 doses as needed for chest pain. 08/19/18   Arty Baumgartner, NP  PRESCRIPTION MEDICATION Apply 1 application topically daily as needed (pain). ThermaGel    [provider]  senna-docusate (SENOKOT S) 8.6-50 MG tablet Take 1 tablet by mouth 2 (two) times daily. 09/10/18   Levora Dredge, MD  sertraline (ZOLOFT) 25 MG tablet Take 25-50 mg by mouth daily as needed (for anxiety).     [provider]  torsemide (DEMADEX) 20 MG tablet Take 1 tablets by mouth daily until see your cardiologist this week. Meanwhile, If your weight up to 3 lb in a day of 5 lb in a week, takes extra pill and inform your primary care or cardiologist. 10/19/18   Jodelle Gross, NP    Family History Family History  Problem Relation Age of Onset  . Heart disease Mother   . Heart Problems Maternal Grandmother   . Other Unknown        negative for premature CAD    Social History Social History   Tobacco Use  . Smoking status: Former Smoker    Packs/day: 1.00    Years: 15.00    Pack years: 15.00    Types: Cigarettes, Cigars  . Smokeless tobacco: Former Neurosurgeon  . Tobacco comment: quit smoking ~ 50 yr ago  Substance Use Topics  . Alcohol use: No    Alcohol/week: 2.0 standard drinks    Types: 2 Cans of beer per week    Comment: rare beer.  . Drug use: No     Allergies   Lisinopril and Xarelto [rivaroxaban]   Review of Systems Review of Systems  Constitutional: Negative for chills and fever.  HENT: Negative for ear pain and sore throat.   Eyes: Negative for pain and visual disturbance.  Respiratory: Negative for cough and shortness of breath.   Cardiovascular: Positive for leg swelling. Negative for chest pain and palpitations.  Gastrointestinal: Negative for abdominal pain and vomiting.  Genitourinary: Negative for dysuria and  hematuria.  Musculoskeletal: Negative for arthralgias and back pain.  Skin: Negative for color change and rash.  Neurological: Negative for seizures and syncope.  All other systems reviewed and are negative.  Physical Exam Updated Vital Signs BP 123/61   Pulse 70   Temp 98.3 F (36.8  C) (Oral)   Resp 20   Ht 5\' 7"  (1.702 m)   Wt 108.9 kg   SpO2 95%   BMI 37.59 kg/m   Physical Exam  Constitutional: He appears well-developed and well-nourished.  HENT:  Head: Normocephalic and atraumatic.  Eyes: Pupils are equal, round, and reactive to light. Conjunctivae and EOM are normal.  Neck: Normal range of motion. Neck supple.  Cardiovascular: Normal rate and regular rhythm.  No murmur heard. Pulmonary/Chest: Effort normal and breath sounds normal. No respiratory distress.  Abdominal: Soft. He exhibits no distension. There is no tenderness. There is no rebound and no guarding.  Musculoskeletal: He exhibits edema (2+ pitting edema to mid calf).  Neurological: He is alert.  Skin: Skin is warm and dry.  Psychiatric: He has a normal mood and affect.  Nursing note and vitals reviewed.  ED Treatments / Results  Labs (all labs ordered are listed, but only abnormal results are displayed) Labs Reviewed  CBC  COMPREHENSIVE METABOLIC PANEL  BRAIN NATRIURETIC PEPTIDE  I-STAT TROPONIN, ED    EKG EKG Interpretation  Date/Time:  Thursday October 19 2018 15:35:37 EST Ventricular Rate:  70 PR Interval:    QRS Duration: 203 QT Interval:  502 QTC Calculation: 542 R Axis:   -76 Text Interpretation:  Nonspecific IVCD with LAD LVH with secondary repolarization abnormality VENTRICULAR PACED RHYTHM Abnormal ekg Confirmed by Gerhard Munch [phone]) on 10/19/2018 3:46:56 PM   Radiology Dg Chest Port 1 View  Result Date: 10/19/2018 CLINICAL DATA:  84 year old male with lower extremity swelling today. EXAM: PORTABLE CHEST 1 VIEW COMPARISON:  Chest radiographs 09/22/2018 and earlier. FINDINGS:  Portable AP semi upright view at 1543 hours. Stable left chest AICD. Stable cardiac size and mediastinal contours. Allowing for portable technique the lungs are clear. Visualized tracheal air column is within normal limits. Stable visualized osseous structures. IMPRESSION: No acute cardiopulmonary abnormality. Electronically Signed   By: Odessa Fleming M.D.   On: 10/19/2018 15:52    Procedures Procedures (including critical care time)  Medications Ordered in ED Medications  lidocaine (LIDODERM) 5 % 1 patch (1 patch Transdermal Patch Applied 10/19/18 1559)  benzonatate (TESSALON) capsule 100 mg (100 mg Oral Given 10/19/18 1559)  HYDROcodone-acetaminophen (NORCO/VICODIN) 5-325 MG per tablet 1 tablet (1 tablet Oral Given 10/19/18 1558)  furosemide (LASIX) injection 40 mg (40 mg Intravenous Given 10/19/18 1559)     Initial Impression / Assessment and Plan / ED Course  I have reviewed the triage vital signs and the nursing notes.  Pertinent labs & imaging results that were available during my care of the patient were reviewed by me and considered in my medical decision making (see chart for details).     Patient is a 84 y.o. male with PMHx of CHF, CAD s/p stent presenting for bilateral lower extremity edema which has been unresolved by PO diuretics.  Vital signs are stable. Patient is on no supplemental O2. PE shows bilateral 2+ pitting edema.  CMP shows elevation in creatinine to 2.25 from 1.74. BNP continues to be elevated but uptrending. Concerning for failed outpatient treatment of CHF exacerbation, that needs to be admitted for IV diuresis with close monitoring of elevation in creatinine. Patient was given IV 40mg  lasix while in the ED, tolerated well.  EKG was reviewed by myself and my attending physician - wide complex QRS, junctional rhythm, no new ST segment elevations or depression, unchanged from previous.   Final Clinical Impressions(s) / ED Diagnoses   Final diagnoses:  Leg swelling  Creatinine elevation    ED Discharge Orders    None       Joaquin Courts, MD 10/19/18 Tawnya Crook, MD 10/20/18 [phone]

## 2018-10-19 NOTE — Progress Notes (Signed)
Cardiology Office Note   Date:  10/19/2018   ID:  Ryan Macdonald, DOB [birth year], MRN [id number]  PCP:  [facility]  Cardiologist:  Dr. Duke Salvia  Chief Complaint  Patient presents with  . Follow-up  . Congestive Heart Failure     History of Present Illness: Ryan Macdonald is a 84 y.o. male who presents for ongoing assessment and management of CAD, cath 07/2018 with 75% stenosis of the proximal LAD with DES. There was residual stenosis in the LM and 50% stenosis in the ostial Cx EF of 20-25%; ICD in situ, atrial fib, chronic systolic CHF, with other history of type II diabetes.   He has had frequent admissions for decompensated CHF and has struggles with low sodium diet. He called our office on 10/18/2018 for complaints of LEE without weight gain. He was last seen by Dr. Duke Salvia on 09/20/2018 and was stable, no evidence of fluid overload, but was admitted again on 09/22/2018 for decompensated CHF and given IV diureses,with discharge weight of 244 lbs. Due to hypotension, isosorbide was held and coreg was switched to metoprolol 100 mg daily.  He comes today with significant LE edema 2+-3+ up to the knees bilaterally. He continues to have salt in his diet, although he is try to cut back. He adds salt to oranges that he eats daily. He has also had some significant depression with some anxiety but has not been taking sertraline daily as directed, only prn. He is losing weight because he is not hungry. Uncertain of his dry weight now.    Past Medical History:  Diagnosis Date  . Atrial fibrillation (HCC)   . Cardiomyopathy (HCC)   . Cat scratch fever    removed mass on right side neck  . Cataracts, bilateral   . CHF (congestive heart failure) (HCC)   . Chronic kidney disease   . Corns and callosities   . Diabetes mellitus without complication (HCC)    TYPE 2  . Dyspnea   . Erythema intertrigo   . Flat foot   . Gout   . High cholesterol   . Hx of urethral stricture   .  Hypertension   . Kidney stones   . Morbid obesity (HCC)   . Nonischemic cardiomyopathy (HCC)    a. ? 2009 Cath in [place] - nl cors per pt;  b. 09/2013 Echo: EF 25-30%, sev diff HK.  . Obesity   . Osteoarthritis   . PAF (paroxysmal atrial fibrillation) (HCC)    a. post-op hip in 2014 - prev on xarelto.  . Renal insufficiency     Past Surgical History:  Procedure Laterality Date  . CARDIAC CATHETERIZATION  1980's  . CIRCUMCISION  1940's  . CORONARY STENT INTERVENTION N/A 08/18/2018   Procedure: CORONARY STENT INTERVENTION;  Surgeon: Corky Crafts, MD;  Location: [facility] INVASIVE CV LAB;  Service: Cardiovascular;  Laterality: N/A;  . CYSTOSCOPY WITH URETHRAL DILATATION  10/23/2013  . CYSTOSCOPY WITH URETHRAL DILATATION N/A 10/23/2013   Procedure: CYSTOSCOPY WITH URETHRAL DILATATION;  Surgeon: Kathi Ludwig, MD;  Location: [facility] OR;  Service: Urology;  Laterality: N/A;  . INCISION AND DRAINAGE OF WOUND Right 1980's   "cat scratch" (10/23/2013)  . INGUINAL HERNIA REPAIR Right 1950's  . INTRAVASCULAR ULTRASOUND/IVUS N/A 08/18/2018   Procedure: Intravascular Ultrasound/IVUS;  Surgeon: Corky Crafts, MD;  Location: [facility] INVASIVE CV LAB;  Service: Cardiovascular;  Laterality: N/A;  . KIDNEY STONE SURGERY Right 1970's; 1983   "twice"  . LEFT HEART CATH  AND CORONARY ANGIOGRAPHY N/A 08/18/2018   Procedure: LEFT HEART CATH AND CORONARY ANGIOGRAPHY;  Surgeon: Corky Crafts, MD;  Location: [facility] INVASIVE CV LAB;  Service: Cardiovascular;  Laterality: N/A;  . TONSILLECTOMY AND ADENOIDECTOMY  1940's  . TOTAL HIP ARTHROPLASTY Left 10/23/2013   Procedure: TOTAL HIP ARTHROPLASTY;  Surgeon: Valeria Batman, MD;  Location: [facility] OR;  Service: Orthopedics;  Laterality: Left;     Current Outpatient Medications  Medication Sig Dispense Refill  . allopurinol (ZYLOPRIM) 100 MG tablet Take 200 mg by mouth daily. To prevent gout    . amiodarone (PACERONE) 200 MG tablet Take 1 tablet (200 mg total)  by mouth 2 (two) times daily. (Patient taking differently: Take 200 mg by mouth 2 (two) times daily. For heart rhythm control) 60 tablet 0  . apixaban (ELIQUIS) 2.5 MG TABS tablet Take 1 tablet (2.5 mg total) by mouth 2 (two) times daily. (Patient taking differently: Take 2.5 mg by mouth 2 (two) times daily. Caution - blood thinner) 90 tablet 0  . benzonatate (TESSALON) 100 MG capsule Take 1 capsule (100 mg total) by mouth 3 (three) times daily as needed for cough. 180 capsule 1  . chlorpheniramine-HYDROcodone (TUSSIONEX PENNKINETIC ER) 10-8 MG/5ML SUER Take 5 mLs by mouth every 12 (twelve) hours as needed for cough. 140 mL 0  . clopidogrel (PLAVIX) 75 MG tablet Take 1 tablet (75 mg total) by mouth daily with breakfast. 30 tablet 0  . cyanocobalamin 1000 MCG tablet Take 1,000 mcg by mouth daily.     . diclofenac sodium (VOLTAREN) 1 % GEL Apply 4 g topically 4 (four) times daily. (Patient taking differently: Apply 4 g topically 4 (four) times daily as needed (pain). ) 1 Tube 0  . finasteride (PROSCAR) 5 MG tablet Take 5 mg by mouth daily.     . fluticasone furoate-vilanterol (BREO ELLIPTA) 100-25 MCG/INH AEPB Inhale 1 puff into the lungs daily. 60 each 3  . glipiZIDE (GLUCOTROL) 5 MG tablet Take 5 mg by mouth daily before breakfast.    . guaiFENesin-codeine (GUAIATUSSIN AC) 100-10 MG/5ML syrup Take 5 mLs by mouth 3 (three) times daily as needed for cough.    Marland Kitchen HYDROcodone-acetaminophen (NORCO/VICODIN) 5-325 MG tablet Take 2 tablets by mouth every 4 (four) hours as needed. (Patient taking differently: Take 1 tablet by mouth 2 (two) times daily as needed for moderate pain. ) 6 tablet 0  . ipratropium-albuterol (DUONEB) 0.5-2.5 (3) MG/3ML SOLN Take 3 mLs by nebulization every 4 (four) hours as needed. (Patient taking differently: Take 3 mLs by nebulization every 4 (four) hours as needed (shortness of breath). ) 360 mL 0  . ketoconazole (NIZORAL) 2 % shampoo Apply 1 application topically 2 (two) times a  week.    . metoprolol succinate (TOPROL XL) 100 MG 24 hr tablet Take 1 tablet (100 mg total) by mouth daily. 90 tablet 3  . nitroGLYCERIN (NITROSTAT) 0.4 MG SL tablet Place 1 tablet (0.4 mg total) under the tongue every 5 (five) minutes x 3 doses as needed for chest pain. 25 tablet 0  . PRESCRIPTION MEDICATION Apply 1 application topically daily as needed (pain). ThermaGel    . senna-docusate (SENOKOT S) 8.6-50 MG tablet Take 1 tablet by mouth 2 (two) times daily. 60 tablet 0  . sertraline (ZOLOFT) 25 MG tablet Take 25-50 mg by mouth daily as needed (for anxiety).     . torsemide (DEMADEX) 20 MG tablet Take 1 tablets by mouth daily until see your cardiologist this week. Meanwhile,  If your weight up to 3 lb in a day of 5 lb in a week, takes extra pill and inform your primary care or cardiologist. 180 tablet 3   No current facility-administered medications for this visit.     Allergies:   Lisinopril and Xarelto [rivaroxaban]    Social History:  The patient  reports that he has quit smoking. His smoking use included cigarettes and cigars. He has a 15.00 pack-year smoking history. He has quit using smokeless tobacco. He reports that he does not drink alcohol or use drugs.   Family History:  The patient's family history includes Heart Problems in his maternal grandmother; Heart disease in his mother; Other in his unknown relative.    ROS: All other systems are reviewed and negative. Unless otherwise mentioned in H&P    PHYSICAL EXAM: VS:  BP 103/70   Pulse 70   Ht 5\' 7"  (1.702 m)   Wt 239 lb (108.4 kg)   SpO2 96%   BMI 37.43 kg/m  , BMI Body mass index is 37.43 kg/m. GEN: Well nourished, well developed, in no acute distress, obese  HEENT: normal Neck: no JVD, carotid bruits, or masses Cardiac: RRR; distant heart sounds no murmurs, rubs, or gallops, 2+-3+ edema  Respiratory:  Clear to auscultation bilaterally, normal work of breathing GI: soft, nontender, nondistended, + BS MS: no  deformity or atrophy Skin: warm and dry, no rash Neuro:  Strength and sensation are intact Psych: euthymic mood, full affect   EKG:  Paced rhythm 70 bpm.   Recent Labs: 09/08/2018: Magnesium 1.8 09/17/2018: ALT 38 09/22/2018: B Natriuretic Peptide 1,184.7 09/23/2018: Hemoglobin 9.5; Platelets 212 09/27/2018: BUN 35; Creatinine, Ser 1.74; Potassium 4.8; Sodium 137    Lipid Panel    Component Value Date/Time   CHOL 159 08/15/2018 0207   TRIG 89 08/15/2018 0207   HDL 32 (L) 08/15/2018 0207   CHOLHDL 5.0 08/15/2018 0207   VLDL 18 08/15/2018 0207   LDLCALC 109 (H) 08/15/2018 0207      Wt Readings from Last 3 Encounters:  10/19/18 239 lb (108.4 kg)  09/27/18 244 lb 4.8 oz (110.8 kg)  09/20/18 253 lb 3.2 oz (114.9 kg)      Other studies Reviewed: Echocardiogram 05/25/18 Left ventricle: The cavity size was moderately dilated. Wall   thickness was normal. Systolic function was severely reduced. The   estimated ejection fraction was in the range of 20% to 25%.   Diffuse hypokinesis. Features are consistent with a pseudonormal   left ventricular filling pattern, with concomitant abnormal   relaxation and increased filling pressure (grade 2 diastolic   dysfunction). Doppler parameters are consistent with high   ventricular filling pressure. - Aortic valve: There was trivial regurgitation. - Mitral valve: There was mild regurgitation. Valve area by   pressure half-time: 1.09 cm^2. - Left atrium: The atrium was mildly dilated. - Right ventricle: Pacer wire or catheter noted in right ventricle.   Systolic function was reduced.  ASSESSMENT AND PLAN:  1. Chronic Mixed CHF: He has evidence of fluid overload today with significant LEE. He has not been compliant with low sodium diet. His weight it actually lower than discharge weight but he is not eating well.      He is to increase torsemide to 40 mg daily for 3 days and then decrease to 20 mg alternating with 40 mg daily. He  will have a BMET in 10 days, just before he sees Dr. Duke Salvia on follow up. He is advised  to wear support hose for dependent edema.   2. ZOX:WRUEA I: GFR 40. He will need follow up. Concerns for cardiorenal vs CHF causing increase in creatinine. Follow up labs are ordered.   3. CAD: Most recent cardiac cath on 08/18/2018 with 75% stenosis of the prox LAD with DES, residual stenosis in the LM and 50 % stenosis in the ostial Cx of 20-25%.  Continue secondary prevention with statin, ASA, BB.   4. Hypertension: Low normal for current LV function. Will follow concerning his response to increased dosing of diuretics. May need to decrease amlodipine dose.   Current medicines are reviewed at length with the patient today.    Labs/ tests ordered today include: BMET Bettey Mare. Liborio Nixon, ANP, [facility]   10/19/2018 10:28 AM    [facility] [address] Office [phone] Fax [phone]

## 2018-10-19 NOTE — ED Notes (Signed)
Gave pt drink

## 2018-10-20 DIAGNOSIS — I2510 Atherosclerotic heart disease of native coronary artery without angina pectoris: Secondary | ICD-10-CM

## 2018-10-20 DIAGNOSIS — Z6837 Body mass index (BMI) 37.0-37.9, adult: Secondary | ICD-10-CM | POA: Diagnosis not present

## 2018-10-20 DIAGNOSIS — F419 Anxiety disorder, unspecified: Secondary | ICD-10-CM

## 2018-10-20 DIAGNOSIS — Z87891 Personal history of nicotine dependence: Secondary | ICD-10-CM

## 2018-10-20 DIAGNOSIS — M109 Gout, unspecified: Secondary | ICD-10-CM | POA: Diagnosis present

## 2018-10-20 DIAGNOSIS — Z7984 Long term (current) use of oral hypoglycemic drugs: Secondary | ICD-10-CM | POA: Diagnosis not present

## 2018-10-20 DIAGNOSIS — Z955 Presence of coronary angioplasty implant and graft: Secondary | ICD-10-CM | POA: Diagnosis not present

## 2018-10-20 DIAGNOSIS — Z79899 Other long term (current) drug therapy: Secondary | ICD-10-CM

## 2018-10-20 DIAGNOSIS — Z888 Allergy status to other drugs, medicaments and biological substances status: Secondary | ICD-10-CM

## 2018-10-20 DIAGNOSIS — G8929 Other chronic pain: Secondary | ICD-10-CM | POA: Diagnosis present

## 2018-10-20 DIAGNOSIS — Z8249 Family history of ischemic heart disease and other diseases of the circulatory system: Secondary | ICD-10-CM | POA: Diagnosis not present

## 2018-10-20 DIAGNOSIS — I5023 Acute on chronic systolic (congestive) heart failure: Secondary | ICD-10-CM

## 2018-10-20 DIAGNOSIS — I110 Hypertensive heart disease with heart failure: Secondary | ICD-10-CM | POA: Diagnosis not present

## 2018-10-20 DIAGNOSIS — Z87442 Personal history of urinary calculi: Secondary | ICD-10-CM | POA: Diagnosis not present

## 2018-10-20 DIAGNOSIS — R05 Cough: Secondary | ICD-10-CM | POA: Diagnosis present

## 2018-10-20 DIAGNOSIS — M7989 Other specified soft tissue disorders: Secondary | ICD-10-CM | POA: Diagnosis present

## 2018-10-20 DIAGNOSIS — I480 Paroxysmal atrial fibrillation: Secondary | ICD-10-CM | POA: Diagnosis present

## 2018-10-20 DIAGNOSIS — M549 Dorsalgia, unspecified: Secondary | ICD-10-CM

## 2018-10-20 DIAGNOSIS — M545 Low back pain: Secondary | ICD-10-CM | POA: Diagnosis present

## 2018-10-20 DIAGNOSIS — Z96642 Presence of left artificial hip joint: Secondary | ICD-10-CM | POA: Diagnosis present

## 2018-10-20 DIAGNOSIS — E7800 Pure hypercholesterolemia, unspecified: Secondary | ICD-10-CM | POA: Diagnosis present

## 2018-10-20 DIAGNOSIS — E119 Type 2 diabetes mellitus without complications: Secondary | ICD-10-CM

## 2018-10-20 DIAGNOSIS — M25511 Pain in right shoulder: Secondary | ICD-10-CM

## 2018-10-20 DIAGNOSIS — Z79891 Long term (current) use of opiate analgesic: Secondary | ICD-10-CM

## 2018-10-20 DIAGNOSIS — I130 Hypertensive heart and chronic kidney disease with heart failure and stage 1 through stage 4 chronic kidney disease, or unspecified chronic kidney disease: Secondary | ICD-10-CM | POA: Diagnosis present

## 2018-10-20 DIAGNOSIS — N179 Acute kidney failure, unspecified: Secondary | ICD-10-CM | POA: Diagnosis present

## 2018-10-20 DIAGNOSIS — N183 Chronic kidney disease, stage 3 (moderate): Secondary | ICD-10-CM | POA: Diagnosis present

## 2018-10-20 DIAGNOSIS — Z7901 Long term (current) use of anticoagulants: Secondary | ICD-10-CM

## 2018-10-20 DIAGNOSIS — I5043 Acute on chronic combined systolic (congestive) and diastolic (congestive) heart failure: Secondary | ICD-10-CM | POA: Diagnosis present

## 2018-10-20 DIAGNOSIS — E1122 Type 2 diabetes mellitus with diabetic chronic kidney disease: Secondary | ICD-10-CM | POA: Diagnosis present

## 2018-10-20 DIAGNOSIS — I4891 Unspecified atrial fibrillation: Secondary | ICD-10-CM

## 2018-10-20 DIAGNOSIS — Z7902 Long term (current) use of antithrombotics/antiplatelets: Secondary | ICD-10-CM

## 2018-10-20 LAB — BASIC METABOLIC PANEL
Anion gap: 10 (ref 5–15)
BUN: 27 mg/dL — AB (ref 8–23)
CALCIUM: 8.6 mg/dL — AB (ref 8.9–10.3)
CHLORIDE: 105 mmol/L (ref 98–111)
CO2: 24 mmol/L (ref 22–32)
CREATININE: 2.11 mg/dL — AB (ref 0.61–1.24)
GFR calc Af Amer: 32 mL/min — ABNORMAL LOW (ref >60)
GFR calc non Af Amer: 28 mL/min — ABNORMAL LOW (ref >60)
Glucose, Bld: 84 mg/dL (ref 70–99)
Potassium: 3.8 mmol/L (ref 3.5–5.1)
Sodium: 139 mmol/L (ref 135–145)

## 2018-10-20 LAB — GLUCOSE, CAPILLARY
GLUCOSE-CAPILLARY: 132 mg/dL — AB (ref 70–99)
Glucose-Capillary: 114 mg/dL — ABNORMAL HIGH (ref 70–99)
Glucose-Capillary: 88 mg/dL (ref 70–99)
Glucose-Capillary: 92 mg/dL (ref 70–99)
Glucose-Capillary: 94 mg/dL (ref 70–99)

## 2018-10-20 NOTE — Progress Notes (Signed)
   Subjective: Ryan Macdonald reports that his shortness of breath has improved since restarting Lasix therapy.  Denies any acute complaints.  Objective:  Vital signs in last 24 hours: Vitals:   10/20/18 0522 10/20/18 0700 10/20/18 0951 10/20/18 1204  BP: (!) 96/57  133/72 103/84  Pulse: 70  70 69  Resp: 18   (!) 22  Temp: 97.6 F (36.4 C)   98.5 F (36.9 C)  TempSrc: Oral   Oral  SpO2: 98% 98% 98% 100%  Weight: 108.9 kg     Height:       Physical Exam  Constitutional: He is oriented to person, place, and time and well-developed, well-nourished, and in no distress.  HENT:  Head: Normocephalic and atraumatic.  Eyes: Pupils are equal, round, and reactive to light. EOM are normal.  Neck: Normal range of motion.  Cardiovascular: Normal rate, regular rhythm and normal heart sounds.  Pulmonary/Chest: Effort normal and breath sounds normal.  Crackles heard at the right lung base.  Left lung clear to auscultation in all lung fields.  Abdominal: Soft. Bowel sounds are normal. He exhibits no distension. There is no tenderness.  Musculoskeletal:  1+ pitting edema up to the mid calf bilaterally.  No tenderness to palpation of the lower extremities bilaterally.  Neurological: He is alert and oriented to person, place, and time.  Skin: Skin is warm and dry.  Psychiatric: Affect and judgment normal.  Nursing note and vitals reviewed.   Assessment/Plan:  Active Problems:   Acute on chronic heart failure [facility])   Mr. Brassard is admitted for acute on chronic heart failure secondary to not taking his diuretics as instructed.  His last baseline weight was approximately 248 pounds but he has lost a significant amount of weight and it is unclear how much his new baseline weight is.  He is currently 240 pounds.  He does however have crackles on exam and significant lower extremity edema.  He is now being treated with 40 mg Lasix IV twice daily.    Acute on Chronic Systolic CHF: 1.  Continue  furosemide 40 mg IV twice daily 2.  Strict ins and outs 3.  Daily weights 4.  Continue telemetry  Acute kidney injury: Most likely secondary to congestive nephropathy in the setting of acute on chronic CHF.  Creatinine has improved since yesterday (2.25-2.11) since beginning Lasix therapy. 1.  Continue to monitor while on diuretic.  CAD : Stable 1.  Continue home medications clopidogrel 75 mg, metoprolol 100 mg qd, and nitroglycerin prn  Atrial Fibrillation: Stable 1. Continue home medication amiodarone 200 mg bid, eliquis 2.5 mg bid   HTN: Blood pressure within normal limits. 1. Continue metoprolol and furosemide per above  T2DM: Home medication glipizide 5 mg qd 1. Continue SSI moderate  Anxiety: Stable 1. Continue home medication sertraline 25 mg qd   Chronic back pain: 1. Continue home hydrocodone-acetaminophen 5-325 mg bid PRN pain  Dispo: Anticipated discharge in approximately 2-3 days.  Synetta Shadow, MD 10/20/2018, 1:35 PM Pager: [phone]

## 2018-10-21 LAB — GLUCOSE, CAPILLARY
GLUCOSE-CAPILLARY: 138 mg/dL — AB (ref 70–99)
GLUCOSE-CAPILLARY: 93 mg/dL (ref 70–99)
Glucose-Capillary: 153 mg/dL — ABNORMAL HIGH (ref 70–99)
Glucose-Capillary: 100 mg/dL — ABNORMAL HIGH (ref 70–99)
Glucose-Capillary: 102 mg/dL — ABNORMAL HIGH (ref 70–99)
Glucose-Capillary: 137 mg/dL — ABNORMAL HIGH (ref 70–99)

## 2018-10-21 LAB — BASIC METABOLIC PANEL
Anion gap: 9 (ref 5–15)
BUN: 26 mg/dL — ABNORMAL HIGH (ref 8–23)
CO2: 26 mmol/L (ref 22–32)
CREATININE: 1.76 mg/dL — AB (ref 0.61–1.24)
Calcium: 8.5 mg/dL — ABNORMAL LOW (ref 8.9–10.3)
Chloride: 101 mmol/L (ref 98–111)
GFR calc non Af Amer: 34 mL/min — ABNORMAL LOW (ref >60)
GFR, EST AFRICAN AMERICAN: 40 mL/min — AB (ref >60)
GLUCOSE: 118 mg/dL — AB (ref 70–99)
Potassium: 3.5 mmol/L (ref 3.5–5.1)
Sodium: 136 mmol/L (ref 135–145)

## 2018-10-21 LAB — MAGNESIUM: Magnesium: 1.8 mg/dL (ref 1.7–2.4)

## 2018-10-21 MED ORDER — GUAIFENESIN-DM 100-10 MG/5ML PO SYRP
5.0000 mL | ORAL_SOLUTION | ORAL | Status: DC | PRN
  Administered 2018-10-21: 5 mL via ORAL
  Filled 2018-10-21: qty 5

## 2018-10-21 MED ORDER — FUROSEMIDE 10 MG/ML IJ SOLN
40.0000 mg | Freq: Three times a day (TID) | INTRAMUSCULAR | Status: DC
  Administered 2018-10-21 – 2018-10-22 (×4): 40 mg via INTRAVENOUS
  Filled 2018-10-21 (×4): qty 4

## 2018-10-21 NOTE — Progress Notes (Signed)
Orthopedic Tech Progress Note Patient Details:  Ryan Macdonald 03-21-1934 [id number]  Ortho Devices Type of Ortho Device: Radio broadcast assistant Ortho Device/Splint Location: bi-lateral Ortho Device/Splint Interventions: Ordered, Application, Adjustment   Post Interventions Patient Tolerated: Well Instructions Provided: Care of device, Adjustment of device   [name] 10/21/2018, 3:49 PM

## 2018-10-21 NOTE — Progress Notes (Signed)
   Subjective: Patient is feeling much better today. He slept good overnight and has been able to ambulate. Reports his dry weight as 328-240lbs. Continues to have cough. Tells me at baseline he usually has mild pitting edema around the ankles. Feels he is having good urine output with the IV furosemide 40 mg. We discussed increasing his furosemide to 40 mg TID and placing unna boot. All questions and concerns addressed.   Objective: Vital signs in last 24 hours: Vitals:   10/20/18 1204 10/20/18 1953 10/21/18 0018 10/21/18 0432  BP: 103/84 (!) 90/59 95/60 101/72  Pulse: 69 66 69 70  Resp: (!) 22 20 20 18   Temp: 98.5 F (36.9 C) (!) 97.4 F (36.3 C) 98.5 F (36.9 C) 98.1 F (36.7 C)  TempSrc: Oral Oral Oral Oral  SpO2: 100% 100% 100% 98%  Weight:   108.4 kg   Height:       General: Obese male in no acute distress Pulm: Good air movement with bibasliar crackles CV: RRR, no murmurs, no rubs  Abdomen: Soft, non-distended, no tenderness to palpation  Extremities: Moderate pitting LE edema to the knees  Assessment/Plan:  Mr. Mercer is admitted for acute on chronic systolic heart failure secondary to not taking his diuretics as instructed. His last baseline weight was approximately 248 pounds but he has lost a significant amount of weight and it is unclear how much his new baseline weight is. He is currently 239 pounds.     Acute on Chronic Systolic CHF: Net negative over the past 24 hours. Weight is below dry weight. He continues to have LE edema and crackles on pulmonary exam. We will increase his diuretics to 40 mg TID.  1.  Increase Furosemide to 40 mg IV TID 2.  Strict ins and outs 3.  Daily weights 4. Continue to monitor electrolytes and renal function 5. Place unna boots   Acute kidney injury: Most likely secondary to congestive nephropathy in the setting of acute on chronic CHF. Creatinine has improved to 1.76 since beginning Lasix therapy. 1.  Continue to monitor  while on diuretic.  CAD: Stable 1.  Continue home medications clopidogrel 75 mg, metoprolol 100 mg qd, and nitroglycerin prn  Atrial Fibrillation: Stable 1. Continue home medication amiodarone 200 mg bid, eliquis 2.5 mg bid  HTN: Blood pressure within normal limits. 1. Continue metoprolol and furosemide per above  T2DM: Home medication glipizide 5 mg qd 1. Continue SSI moderate  Anxiety: Stable 1. Continue home medication sertraline 25 mg qd   Chronic back pain: 1. Continue home hydrocodone-acetaminophen 5-325 mg bidPRN pain  Dispo: Anticipated discharge in approximately 0-1 day(s). Patient is high risk for readmission as he has been admitted 7 times in the past 6 months. He is below his reported dry weight. Continue HF education and close outpatient cardiology follow-up.  Levora Dredge, MD 10/21/2018, 6:45 AM

## 2018-10-22 LAB — GLUCOSE, CAPILLARY
GLUCOSE-CAPILLARY: 119 mg/dL — AB (ref 70–99)
Glucose-Capillary: 92 mg/dL (ref 70–99)

## 2018-10-22 LAB — BASIC METABOLIC PANEL
Anion gap: 9 (ref 5–15)
BUN: 24 mg/dL — ABNORMAL HIGH (ref 8–23)
CHLORIDE: 102 mmol/L (ref 98–111)
CO2: 28 mmol/L (ref 22–32)
Calcium: 8.9 mg/dL (ref 8.9–10.3)
Creatinine, Ser: 1.59 mg/dL — ABNORMAL HIGH (ref 0.61–1.24)
GFR calc Af Amer: 45 mL/min — ABNORMAL LOW (ref >60)
GFR calc non Af Amer: 39 mL/min — ABNORMAL LOW (ref >60)
Glucose, Bld: 103 mg/dL — ABNORMAL HIGH (ref 70–99)
Potassium: 3.6 mmol/L (ref 3.5–5.1)
Sodium: 139 mmol/L (ref 135–145)

## 2018-10-22 LAB — MAGNESIUM: MAGNESIUM: 1.9 mg/dL (ref 1.7–2.4)

## 2018-10-22 MED ORDER — TORSEMIDE 20 MG PO TABS: 80.0000 mg | ORAL_TABLET | Freq: Every day | ORAL | 0 refills | Status: DC

## 2018-10-22 MED ORDER — TORSEMIDE 20 MG PO TABS: ORAL_TABLET | ORAL | 3 refills | Status: DC

## 2018-10-22 NOTE — Discharge Instructions (Signed)
Thank you for allowing [place] to provide your care. We have made some changes to your torsemide. It is important that you follow-up with cardiology as soon as possible.   Information on my medicine - ELIQUIS (apixaban)  This medication education was reviewed with me or my healthcare representative as part of my discharge preparation. Why was Eliquis prescribed for you? Eliquis was prescribed for you to reduce the risk of a blood clot forming that can cause a stroke if you have a medical condition called atrial fibrillation (a type of irregular heartbeat).  What do You need to know about Eliquis ? Take your Eliquis TWICE DAILY - one tablet in the morning and one tablet in the evening with or without food. If you have difficulty swallowing the tablet whole please discuss with your pharmacist how to take the medication safely.  Take Eliquis exactly as prescribed by your doctor and DO NOT stop taking Eliquis without talking to the doctor who prescribed the medication.  Stopping may increase your risk of developing a stroke.  Refill your prescription before you run out.  After discharge, you should have regular check-up appointments with your healthcare provider that is prescribing your Eliquis.  In the future your dose may need to be changed if your kidney function or weight changes by a significant amount or as you get older.  What do you do if you miss a dose? If you miss a dose, take it as soon as you remember on the same day and resume taking twice daily.  Do not take more than one dose of ELIQUIS at the same time to make up a missed dose.  Important Safety Information A possible side effect of Eliquis is bleeding. You should call your healthcare provider right away if you experience any of the following: ? Bleeding from an injury or your nose that does not stop. ? Unusual colored urine (red or dark brown) or unusual colored stools (red or black). ? Unusual bruising for unknown  reasons. ? A serious fall or if you hit your head (even if there is no bleeding).  Some medicines may interact with Eliquis and might increase your risk of bleeding or clotting while on Eliquis. To help avoid this, consult your healthcare provider or pharmacist prior to using any new prescription or non-prescription medications, including herbals, vitamins, non-steroidal anti-inflammatory drugs (NSAIDs) and supplements.  This website has more information on Eliquis (apixaban): http://www.eliquis.com/eliquis/home

## 2018-10-22 NOTE — Progress Notes (Signed)
Pt got discharged to home, discharge instructions provided and patient showed understanding to it, IV taken out,Telemonitor DC,pt left unit in wheelchair with all of the belongings accompanied with a family member (wife)  Ngina Royer,RN 

## 2018-10-22 NOTE — Progress Notes (Signed)
   Subjective: Ryan Macdonald reports that his shortness of breath has much improved and he is only needing oxygen at night.  He also feels that his lower extremity swelling has improved since starting IV Lasix.  He overall feels much better.  Objective:  Vital signs in last 24 hours: Vitals:   10/21/18 1148 10/21/18 1955 10/22/18 0145 10/22/18 0527  BP: 97/60 95/63  107/64  Pulse: 68 70  72  Resp: 18 18  18   Temp: 98.1 F (36.7 C) 98.6 F (37 C)  98.7 F (37.1 C)  TempSrc: Oral Oral  Oral  SpO2: 100% 98%  99%  Weight:   107.5 kg   Height:       Physical Exam  Constitutional: He appears well-developed and well-nourished.  HENT:  Head: Normocephalic and atraumatic.  Eyes: EOM are normal.  Neck: Normal range of motion.  Cardiovascular: Normal rate, regular rhythm and normal heart sounds.  Pulmonary/Chest: Effort normal. No respiratory distress.  Minimal crackles at the lower lung bases bilaterally.  Abdominal: Soft. Bowel sounds are normal. He exhibits no distension. There is no tenderness.  Musculoskeletal: Normal range of motion. He exhibits no edema or tenderness.  Unna boots on.  1+ pitting edema up to the knee bilaterally.  Neurological: He is alert.  Skin: Skin is warm and dry.  Psychiatric: He has a normal mood and affect. His behavior is normal.  Nursing note and vitals reviewed.   Assessment/Plan:  Principal Problem:   Acute on chronic combined systolic and diastolic heart failure (HCC) Active Problems:   Atrial fibrillation (HCC)   Acute on chronic heart failure [facility])  Mr. Singletonis admitted for acute on chronic systolic heart failure secondary to not taking his diuretics as instructed.  His symptoms have significantly improved with minimal shortness of breath on exertion and no oxygen requirement during the day.  He has had good response to Lasix treatment with net negative 1440 mL over the last 24 hours and he is below his dry weight.  He does continue to  have bilateral lower extremity edema but with minimal crackles on pulmonary exam.  He is overall doing much better and is stable for discharge today.  Acute on Chronic Systolic CHF: 1.1 dose of furosemide to 40 mg IV  prior to discharge. 2.  We will go home on the following diuretic regiment: Torsemide 80 mg daily for 3 days and torsemide 20 mg daily indefinitely. 3.  Continue Unna boots.  Can remove patient's discretion.  Acute kidney injury:Most likely secondary to congestive nephropathy in the setting of acute on chronic CHF. Creatinine has improved to 1.59with Lasix therapy. 1. Continue to monitor while on diuretic.  WSF:KCLEXN 1. Continue home medications clopidogrel 75 mg, metoprolol 100 mg qd,andnitroglycerin prn  Atrial Fibrillation: Stable 1. Continuehome medication amiodarone 200 mg bid and Eliquis 2.5 mg bid  TZG:YFVCB pressure withinnormallimits. 1. Continuemetoprolol and furosemide per above  T2DM: 1. Continue glipizide 5 mg daily at discharge  Anxiety: Stable 1. Continuehome medication sertraline 25 mg qd   Chronic back pain: 1. Continuehome hydrocodone-acetaminophen 5-325 mg bidPRN pain  Dispo: Anticipated discharge today.  Synetta Shadow, MD 10/22/2018, 6:39 AM Pager: [phone]

## 2018-10-22 NOTE — Discharge Summary (Signed)
Name: Ryan Macdonald MRN: [id number] DOB: [birth year] 84 y.o. PCP: [facility]  Date of Admission: 10/19/2018  3:30 PM Date of Discharge: 10/22/2018 Attending Physician: Burns Spain, MD  Discharge Diagnosis: Acute on Chronic Systolic Heart Failure  Discharge Medications: Allergies as of 10/22/2018      Reactions   Lisinopril Swelling   Facial swelling   Xarelto [rivaroxaban] Other (See Comments)   "Blood came out of my penis"      Medication List    TAKE these medications   allopurinol 100 MG tablet Commonly known as:  ZYLOPRIM Take 200 mg by mouth daily as needed (for gout symptoms). To prevent gout   amiodarone 200 MG tablet Commonly known as:  PACERONE Take 1 tablet (200 mg total) by mouth 2 (two) times daily. What changed:  additional instructions   apixaban 2.5 MG Tabs tablet Commonly known as:  ELIQUIS Take 1 tablet (2.5 mg total) by mouth 2 (two) times daily. What changed:  additional instructions   benzonatate 100 MG capsule Commonly known as:  TESSALON Take 1 capsule (100 mg total) by mouth 3 (three) times daily as needed for cough.   chlorpheniramine-HYDROcodone 10-8 MG/5ML Suer Commonly known as:  TUSSIONEX Take 5 mLs by mouth every 12 (twelve) hours as needed for cough.   clopidogrel 75 MG tablet Commonly known as:  PLAVIX Take 1 tablet (75 mg total) by mouth daily with breakfast.   cyanocobalamin 1000 MCG tablet Take 1,000 mcg by mouth daily.   diclofenac sodium 1 % Gel Commonly known as:  VOLTAREN Apply 4 g topically 4 (four) times daily. What changed:    when to take this  reasons to take this   finasteride 5 MG tablet Commonly known as:  PROSCAR Take 5 mg by mouth daily.   fluticasone furoate-vilanterol 100-25 MCG/INH Aepb Commonly known as:  BREO ELLIPTA Inhale 1 puff into the lungs daily.   glipiZIDE 5 MG tablet Commonly known as:  GLUCOTROL Take 5 mg by mouth daily before breakfast.   GUAIATUSSIN AC  100-10 MG/5ML syrup Generic drug:  guaiFENesin-codeine Take 5 mLs by mouth 3 (three) times daily as needed for cough.   HYDROcodone-acetaminophen 5-325 MG tablet Commonly known as:  NORCO/VICODIN Take 2 tablets by mouth every 4 (four) hours as needed. What changed:    how much to take  when to take this  reasons to take this   ipratropium-albuterol 0.5-2.5 (3) MG/3ML Soln Commonly known as:  DUONEB Take 3 mLs by nebulization every 4 (four) hours as needed. What changed:  reasons to take this   ketoconazole 2 % shampoo Commonly known as:  NIZORAL Apply 1 application topically 2 (two) times a week.   metoprolol succinate 100 MG 24 hr tablet Commonly known as:  TOPROL-XL Take 1 tablet (100 mg total) by mouth daily.   nitroGLYCERIN 0.4 MG SL tablet Commonly known as:  NITROSTAT Place 1 tablet (0.4 mg total) under the tongue every 5 (five) minutes x 3 doses as needed for chest pain.   PRESCRIPTION MEDICATION Apply 1 application topically daily as needed (pain). ThermaGel   senna-docusate 8.6-50 MG tablet Commonly known as:  Senokot-S Take 1 tablet by mouth 2 (two) times daily.   sertraline 25 MG tablet Commonly known as:  ZOLOFT Take 25 mg by mouth daily.   torsemide 20 MG tablet Commonly known as:  DEMADEX Take 4 tablets (80 mg total) by mouth daily for 3 days. What changed:  You were already taking  a medication with the same name, and this prescription was added. Make sure you understand how and when to take each.   torsemide 20 MG tablet Commonly known as:  DEMADEX Take 1 tablets by mouth daily until see your cardiologist this week. Meanwhile, If your weight up to 3 lb in a day of 5 lb in a week, takes extra pill and inform your primary care or cardiologist. Start taking on:  10/25/2018 What changed:  These instructions start on 10/25/2018. If you are unsure what to do until then, ask your doctor or other care provider.     Disposition and follow-up:   RyanJullien  Macdonald was discharged from [facility] in Stable condition.  At the hospital follow up visit please address:  1.  CHF. Discuss a HF action plan with the patient and consider increasing his baseline diuretic regimen to prevent recurrent hospitalizations. Weight on discharge: 107.5kg   2.  Labs / imaging needed at time of follow-up: BMP  3.  Pending labs/ test needing follow-up: None  Follow-up Appointments: Follow-up Information    [name], MD Follow up.   Specialty:  Cardiology Contact information: [address] [place] [address] [place] [address] [phone]        [facility] Follow [facility].   Specialty:  General Practice Contact information: [address] [place] [address] [phone]         Hospital Course by problem list:  Acute on Chronic Systolic Heart Failure. Ryan Macdonald is an 84 year old man with systolic heart failure, with a left ventricular ejection fraction of 20 to 25% who presented to the emergency department with dyspnea and worsening lower extremity edema. The patient has frequent admissions for acute heart failure exacerbation and has been admitted seven times in the past six months for similar presentations. He has close outpatient follow-up with his cardiologist. Upon admission he was started on IV furosemide 40 mg TID and diuresis total of 3 L and his weight decreased approximately 2.5 kg to 107.5kg. Unna boots were placed on the patient's LE and his torsemide was increased to 80 mg once daily for three days followed by a resumption of his normal prescription, torsemide 20 mg once daily. He will need close outpatient follow-up with his cardiologist an increase in his space diuretic dosage. No other medications were changed during this hospitalization.  Discharge Vitals:   BP 107/64 (BP Location: Left Arm)   Pulse 72   Temp 98.7 F (37.1 C) (Oral)   Resp 18   Ht 5\' 7"  (1.702 m)   Wt 107.5 kg   SpO2 99%   BMI  37.14 kg/m   Discharge Instructions: Discharge Instructions    (HEART FAILURE PATIENTS) Call MD:  Anytime you have any of the following symptoms: 1) 3 pound weight gain in 24 hours or 5 pounds in 1 week 2) shortness of breath, with or without a dry hacking cough 3) swelling in the hands, feet or stomach 4) if you have to sleep on extra pillows at night in order to breathe.   Complete by:  As directed    Diet - low sodium heart healthy   Complete by:  As directed    Increase activity slowly   Complete by:  As directed     Signed: Levora Dredge, MD 10/22/2018, 9:26 AM

## 2018-10-25 ENCOUNTER — Telehealth: Payer: Self-pay | Admitting: Cardiovascular Disease

## 2018-10-25 NOTE — Telephone Encounter (Signed)
Okay to resume home health for now.  In general this should be done by his primary care.

## 2018-10-25 NOTE — Telephone Encounter (Signed)
Will forward to Dr Duke Salvia not sure if does home health orders .cy

## 2018-10-25 NOTE — Telephone Encounter (Signed)
New Message            Nicolette calling from [facility]. She states patient went to the hospital and now he's back home and they need an order to resume his home health and physical needs. Pls call and advise.

## 2018-10-29 ENCOUNTER — Encounter (HOSPITAL_COMMUNITY): Payer: Self-pay | Admitting: Emergency Medicine

## 2018-10-29 ENCOUNTER — Emergency Department (HOSPITAL_COMMUNITY): Payer: Non-veteran care

## 2018-10-29 ENCOUNTER — Emergency Department (HOSPITAL_COMMUNITY)
Admission: EM | Admit: 2018-10-29 | Discharge: 2018-10-29 | Disposition: A | Discharge status: Home / Self Care | Payer: Non-veteran care | Attending: Emergency Medicine | Admitting: Emergency Medicine

## 2018-10-29 DIAGNOSIS — Z7902 Long term (current) use of antithrombotics/antiplatelets: Secondary | ICD-10-CM | POA: Diagnosis not present

## 2018-10-29 DIAGNOSIS — Z86718 Personal history of other venous thrombosis and embolism: Secondary | ICD-10-CM | POA: Diagnosis not present

## 2018-10-29 DIAGNOSIS — I480 Paroxysmal atrial fibrillation: Secondary | ICD-10-CM | POA: Insufficient documentation

## 2018-10-29 DIAGNOSIS — I509 Heart failure, unspecified: Secondary | ICD-10-CM

## 2018-10-29 DIAGNOSIS — I5042 Chronic combined systolic (congestive) and diastolic (congestive) heart failure: Secondary | ICD-10-CM | POA: Diagnosis not present

## 2018-10-29 DIAGNOSIS — E7800 Pure hypercholesterolemia, unspecified: Secondary | ICD-10-CM | POA: Insufficient documentation

## 2018-10-29 DIAGNOSIS — N183 Chronic kidney disease, stage 3 (moderate): Secondary | ICD-10-CM | POA: Diagnosis not present

## 2018-10-29 DIAGNOSIS — Z7901 Long term (current) use of anticoagulants: Secondary | ICD-10-CM | POA: Diagnosis not present

## 2018-10-29 DIAGNOSIS — R609 Edema, unspecified: Secondary | ICD-10-CM

## 2018-10-29 DIAGNOSIS — E119 Type 2 diabetes mellitus without complications: Secondary | ICD-10-CM | POA: Insufficient documentation

## 2018-10-29 DIAGNOSIS — Z95810 Presence of automatic (implantable) cardiac defibrillator: Secondary | ICD-10-CM | POA: Insufficient documentation

## 2018-10-29 DIAGNOSIS — I428 Other cardiomyopathies: Secondary | ICD-10-CM | POA: Insufficient documentation

## 2018-10-29 DIAGNOSIS — I130 Hypertensive heart and chronic kidney disease with heart failure and stage 1 through stage 4 chronic kidney disease, or unspecified chronic kidney disease: Secondary | ICD-10-CM | POA: Diagnosis not present

## 2018-10-29 DIAGNOSIS — Z87891 Personal history of nicotine dependence: Secondary | ICD-10-CM | POA: Insufficient documentation

## 2018-10-29 DIAGNOSIS — Z7984 Long term (current) use of oral hypoglycemic drugs: Secondary | ICD-10-CM | POA: Insufficient documentation

## 2018-10-29 DIAGNOSIS — R600 Localized edema: Secondary | ICD-10-CM | POA: Insufficient documentation

## 2018-10-29 DIAGNOSIS — Z79899 Other long term (current) drug therapy: Secondary | ICD-10-CM | POA: Insufficient documentation

## 2018-10-29 LAB — CBC
HCT: 33.7 % — ABNORMAL LOW (ref 39.0–52.0)
Hemoglobin: 10 g/dL — ABNORMAL LOW (ref 13.0–17.0)
MCH: 27.7 pg (ref 26.0–34.0)
MCHC: 29.7 g/dL — ABNORMAL LOW (ref 30.0–36.0)
MCV: 93.4 fL (ref 80.0–100.0)
PLATELETS: 293 10*3/uL (ref 150–400)
RBC: 3.61 MIL/uL — ABNORMAL LOW (ref 4.22–5.81)
RDW: 17.2 % — AB (ref 11.5–15.5)
WBC: 6.2 10*3/uL (ref 4.0–10.5)
nRBC: 0 % (ref 0.0–0.2)

## 2018-10-29 LAB — BASIC METABOLIC PANEL
Anion gap: 12 (ref 5–15)
BUN: 29 mg/dL — ABNORMAL HIGH (ref 8–23)
CALCIUM: 8.7 mg/dL — AB (ref 8.9–10.3)
CO2: 26 mmol/L (ref 22–32)
Chloride: 102 mmol/L (ref 98–111)
Creatinine, Ser: 1.87 mg/dL — ABNORMAL HIGH (ref 0.61–1.24)
GFR calc Af Amer: 38 mL/min — ABNORMAL LOW (ref >60)
GFR, EST NON AFRICAN AMERICAN: 33 mL/min — AB (ref >60)
Glucose, Bld: 61 mg/dL — ABNORMAL LOW (ref 70–99)
Potassium: 3.5 mmol/L (ref 3.5–5.1)
Sodium: 140 mmol/L (ref 135–145)

## 2018-10-29 LAB — BRAIN NATRIURETIC PEPTIDE: B NATRIURETIC PEPTIDE 5: 1095.3 pg/mL — AB (ref 0.0–100.0)

## 2018-10-29 MED ORDER — BENZONATATE 100 MG PO CAPS
100.0000 mg | ORAL_CAPSULE | Freq: Three times a day (TID) | ORAL | Status: DC | PRN
  Filled 2018-10-29: qty 1

## 2018-10-29 MED ORDER — FUROSEMIDE 10 MG/ML IJ SOLN
80.0000 mg | Freq: Once | INTRAMUSCULAR | Status: AC
  Administered 2018-10-29: 80 mg via INTRAVENOUS
  Filled 2018-10-29: qty 8

## 2018-10-29 NOTE — ED Provider Notes (Signed)
Pt signed out from Dr. Rosalia Hammers pending lasix administration.  Pt is feeling better after lasix.  He will be d/c and instructed to return if worse.   Jacalyn Lefevre, MD 10/29/18 [phone]

## 2018-10-29 NOTE — Discharge Instructions (Signed)
Continue your home medications as prescribed on discharge from the hospital Please follow-up with your cardiologist as scheduled Return here if you become short of breath have increased weight gain

## 2018-10-29 NOTE — ED Triage Notes (Signed)
Pt to ER for lower leg swelling, states was seen here last week for same and given dose of lasix. History of CHF. NAD at this time. Normally wears 2L O2 at night and PRN, reports increase in use.

## 2018-10-29 NOTE — ED Provider Notes (Signed)
[name] EMERGENCY DEPARTMENT Provider Note   CSN: [id number] Arrival date & time: 10/29/18  1352     History   Chief Complaint Chief Complaint  Patient presents with  . Leg Swelling    HPI Ryan Macdonald is a 84 y.o. male.  HPI 84 year old man history of CHF, chronic atrial fibrillation discharged several days ago from internal medicine teaching service after admission for acute on chronic systolic heart failure.  During his admission he was diuresed.  On discharge his weight was 107.5 and he had a una boots in place.  He presents today complaining of ongoing peripheral edema.  He denies any chest pain or dyspnea.  He reports taking his medications as prescribed.  Torsemide was increased to 90 mg once daily for 3 days followed by resumption of normal prescription of 20 mg once daily. Past Medical History:  Diagnosis Date  . Atrial fibrillation (HCC)   . Cardiomyopathy (HCC)   . Cat scratch fever    removed mass on right side neck  . Cataracts, bilateral   . CHF (congestive heart failure) (HCC)   . Chronic kidney disease   . Corns and callosities   . Diabetes mellitus without complication (HCC)    TYPE 2  . Dyspnea   . Erythema intertrigo   . Flat foot   . Gout   . High cholesterol   . Hx of urethral stricture   . Hypertension   . Kidney stones   . Morbid obesity (HCC)   . Nonischemic cardiomyopathy (HCC)    a. ? 2009 Cath in [place] - nl cors per pt;  b. 09/2013 Echo: EF 25-30%, sev diff HK.  . Obesity   . Osteoarthritis   . PAF (paroxysmal atrial fibrillation) (HCC)    a. post-op hip in 2014 - prev on xarelto.  . Renal insufficiency     Patient Active Problem List   Diagnosis Date Noted  . Acute on chronic heart failure (HCC) 10/19/2018  . Pulmonary emphysema (HCC)   . Chronic respiratory failure with hypoxia (HCC)   . Morbid obesity (HCC) 09/09/2018  . URI (upper respiratory infection) 08/31/2018  . UTI (urinary tract infection) 08/31/2018    . Acute on chronic systolic (congestive) heart failure (HCC) 08/28/2018  . CHF (congestive heart failure) (HCC) 08/14/2018  . Acute on chronic combined systolic and diastolic congestive heart failure (HCC) 07/18/2018  . First degree AV block   . Shortness of breath 04/29/2018  . Acute HFrEF (heart failure with reduced ejection fraction) (HCC) 04/29/2018  . Acute respiratory failure with hypoxia (HCC) 09/27/2017  . CHF exacerbation (HCC) 09/27/2017  . Renal insufficiency   . Acute on chronic systolic CHF (congestive heart failure) (HCC) 07/08/2017  . Left knee pain 07/08/2017  . Acute bronchitis 05/10/2017  . Acute congestive heart failure (HCC) 04/14/2017  . Elevated troponin 04/14/2017  . Lactic acidosis   . Acute on chronic systolic heart failure, NYHA class 2 (HCC) 02/03/2017  . Diabetes mellitus type 2 in obese (HCC) 02/03/2017  . CAP (community acquired pneumonia) 02/03/2017  . Lobar pneumonia (HCC)   . Diabetes mellitus with complication (HCC)   . Acute on chronic respiratory failure with hypoxia (HCC)   . Acute on chronic combined systolic and diastolic heart failure (HCC) 05/19/2016  . Depression 05/19/2016  . Gout 05/19/2016  . 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 NSVT (nonsustained ventricular tachycardia) (HCC)   . Chronic renal insufficiency, stage 3 (moderate) (HCC)   .  Pulmonary embolism (HCC)   . Acute respiratory failure (HCC)   . Osteoarthritis of left hip 10/25/2013  . Atrial fibrillation (HCC) 10/25/2013  . S/P hip replacement 10/24/2013  . Nonischemic cardiomyopathy (HCC)   . Obesity (BMI 30-39.9)   . Essential hypertension   . Hyperlipidemia     Past Surgical History:  Procedure Laterality Date  . CARDIAC CATHETERIZATION  1980's  . CIRCUMCISION  1940's  . CORONARY STENT INTERVENTION N/A 08/18/2018   Procedure: CORONARY STENT INTERVENTION;  Surgeon: Corky Crafts, MD;  Location: [facility] INVASIVE CV LAB;  Service:  Cardiovascular;  Laterality: N/A;  . CYSTOSCOPY WITH URETHRAL DILATATION  10/23/2013  . CYSTOSCOPY WITH URETHRAL DILATATION N/A 10/23/2013   Procedure: CYSTOSCOPY WITH URETHRAL DILATATION;  Surgeon: Kathi Ludwig, MD;  Location: [facility] OR;  Service: Urology;  Laterality: N/A;  . INCISION AND DRAINAGE OF WOUND Right 1980's   "cat scratch" (10/23/2013)  . INGUINAL HERNIA REPAIR Right 1950's  . INTRAVASCULAR ULTRASOUND/IVUS N/A 08/18/2018   Procedure: Intravascular Ultrasound/IVUS;  Surgeon: Corky Crafts, MD;  Location: [facility] INVASIVE CV LAB;  Service: Cardiovascular;  Laterality: N/A;  . KIDNEY STONE SURGERY Right 1970's; 1983   "twice"  . LEFT HEART CATH AND CORONARY ANGIOGRAPHY N/A 08/18/2018   Procedure: LEFT HEART CATH AND CORONARY ANGIOGRAPHY;  Surgeon: Corky Crafts, MD;  Location: [facility] INVASIVE CV LAB;  Service: Cardiovascular;  Laterality: N/A;  . TONSILLECTOMY AND ADENOIDECTOMY  1940's  . TOTAL HIP ARTHROPLASTY Left 10/23/2013   Procedure: TOTAL HIP ARTHROPLASTY;  Surgeon: Valeria Batman, MD;  Location: [facility] OR;  Service: Orthopedics;  Laterality: Left;        Home Medications    Prior to Admission medications   Medication Sig Start Date End Date Taking? Authorizing Provider  allopurinol (ZYLOPRIM) 100 MG tablet Take 200 mg by mouth daily as needed (for gout symptoms). To prevent gout    [provider]  amiodarone (PACERONE) 200 MG tablet Take 1 tablet (200 mg total) by mouth 2 (two) times daily. Patient taking differently: Take 200 mg by mouth 2 (two) times daily. For heart rhythm control 02/16/18   Burna Cash, MD  apixaban (ELIQUIS) 2.5 MG TABS tablet Take 1 tablet (2.5 mg total) by mouth 2 (two) times daily. Patient taking differently: Take 2.5 mg by mouth 2 (two) times daily. Caution - blood thinner 05/04/18   Angelita Ingles, MD  benzonatate (TESSALON) 100 MG capsule Take 1 capsule (100 mg total) by mouth 3 (three) times daily as needed for  cough. 10/19/18   Jodelle Gross, NP  chlorpheniramine-HYDROcodone [facility] PENNKINETIC ER) 10-8 MG/5ML SUER Take 5 mLs by mouth every 12 (twelve) hours as needed for cough. Patient not taking: Reported on 10/19/2018 09/20/18   [name], MD  clopidogrel (PLAVIX) 75 MG tablet Take 1 tablet (75 mg total) by mouth daily with breakfast. 08/20/18   Laverda Page B, NP  cyanocobalamin 1000 MCG tablet Take 1,000 mcg by mouth daily.     [provider]  diclofenac sodium (VOLTAREN) 1 % GEL Apply 4 g topically 4 (four) times daily. Patient taking differently: Apply 4 g topically 4 (four) times daily as needed (for pain in the back and both knees).  07/10/17   Valentino Nose, MD  finasteride (PROSCAR) 5 MG tablet Take 5 mg by mouth daily.     [provider]  fluticasone furoate-vilanterol (BREO ELLIPTA) 100-25 MCG/INH AEPB Inhale 1 puff into the lungs daily. 05/05/18   Angelita Ingles,  MD  glipiZIDE (GLUCOTROL) 5 MG tablet Take 5 mg by mouth daily before breakfast.    [provider]  guaiFENesin-codeine (GUAIATUSSIN AC) 100-10 MG/5ML syrup Take 5 mLs by mouth 3 (three) times daily as needed for cough.    [provider]  HYDROcodone-acetaminophen (NORCO/VICODIN) 5-325 MG tablet Take 2 tablets by mouth every 4 (four) hours as needed. Patient taking differently: Take 1 tablet by mouth 2 (two) times daily as needed (for pain).  04/09/16   Isa Rankin, MD  ipratropium-albuterol (DUONEB) 0.5-2.5 (3) MG/3ML SOLN Take 3 mLs by nebulization every 4 (four) hours as needed. Patient taking differently: Take 3 mLs by nebulization every 4 (four) hours as needed (shortness of breath).  05/14/17   Pearson Grippe, MD  ketoconazole (NIZORAL) 2 % shampoo Apply 1 application topically 2 (two) times a week.    [provider]  metoprolol succinate (TOPROL XL) 100 MG 24 hr tablet Take 1 tablet (100 mg total) by mouth daily. 10/04/18   [name], MD  nitroGLYCERIN  (NITROSTAT) 0.4 MG SL tablet Place 1 tablet (0.4 mg total) under the tongue every 5 (five) minutes x 3 doses as needed for chest pain. 08/19/18   Arty Baumgartner, NP  PRESCRIPTION MEDICATION Apply 1 application topically daily as needed (pain). ThermaGel    [provider]  senna-docusate (SENOKOT S) 8.6-50 MG tablet Take 1 tablet by mouth 2 (two) times daily. 09/10/18   Levora Dredge, MD  sertraline (ZOLOFT) 25 MG tablet Take 25 mg by mouth daily.     [provider]  torsemide (DEMADEX) 20 MG tablet Take 1 tablets by mouth daily until see your cardiologist this week. Meanwhile, If your weight up to 3 lb in a day of 5 lb in a week, takes extra pill and inform your primary care or cardiologist. 10/25/18   Levora Dredge, MD  torsemide (DEMADEX) 20 MG tablet Take 4 tablets (80 mg total) by mouth daily for 3 days. 10/22/18 10/25/18  Levora Dredge, MD    Family History Family History  Problem Relation Age of Onset  . Heart disease Mother   . Heart Problems Maternal Grandmother   . Other Unknown        negative for premature CAD    Social History Social History   Tobacco Use  . Smoking status: Former Smoker    Packs/day: 1.00    Years: 15.00    Pack years: 15.00    Types: Cigarettes, Cigars  . Smokeless tobacco: Former Neurosurgeon  . Tobacco comment: quit smoking ~ 50 yr ago  Substance Use Topics  . Alcohol use: No    Alcohol/week: 2.0 standard drinks    Types: 2 Cans of beer per week    Comment: rare beer.  . Drug use: No     Allergies   Lisinopril and Xarelto [rivaroxaban]   Review of Systems Review of Systems  All other systems reviewed and are negative.    Physical Exam Updated Vital Signs BP 122/84 (BP Location: Right Arm)   Pulse 69   Temp 98.5 F (36.9 C) (Oral)   Resp (!) 21   Wt 111 kg   SpO2 96%   BMI 38.33 kg/m   Physical Exam  Constitutional: He is oriented to person, place, and time. He appears well-developed and well-nourished.    HENT:  Head: Normocephalic and atraumatic.  Right Ear: External ear normal.  Left Ear: External ear normal.  Mouth/Throat: Oropharynx is clear and moist.  Eyes:  Pupils are equal, round, and reactive to light.  Neck: Normal range of motion.  Cardiovascular: Normal rate and regular rhythm.  Pulmonary/Chest: Effort normal and breath sounds normal.  Abdominal: Soft. Bowel sounds are normal.  Musculoskeletal:  Peripheral edema with Unna boots in place No wounds noted  Neurological: He is alert and oriented to person, place, and time.  Skin: Skin is warm and dry. Capillary refill takes less than 2 seconds.  Psychiatric: He has a normal mood and affect.  Nursing note and vitals reviewed.    ED Treatments / Results  Labs (all labs ordered are listed, but only abnormal results are displayed) Labs Reviewed  CBC - Abnormal; Notable for the following components:      Result Value   RBC 3.61 (*)    Hemoglobin 10.0 (*)    HCT 33.7 (*)    MCHC 29.7 (*)    RDW 17.2 (*)    All other components within normal limits  BASIC METABOLIC PANEL - Abnormal; Notable for the following components:   Glucose, Bld 61 (*)    BUN 29 (*)    Creatinine, Ser 1.87 (*)    Calcium 8.7 (*)    GFR calc non Af Amer 33 (*)    GFR calc Af Amer 38 (*)    All other components within normal limits  BRAIN NATRIURETIC PEPTIDE - Abnormal; Notable for the following components:   B Natriuretic Peptide 1,095.3 (*)    All other components within normal limits    EKG EKG Interpretation  Date/Time:  Sunday October 29 2018 14:00:41 EST Ventricular Rate:  71 PR Interval:    QRS Duration: 119 QT Interval:  440 QTC Calculation: 479 R Axis:   70 Text Interpretation:  Sinus rhythm Prolonged PR interval Nonspecific intraventricular conduction delay Borderline low voltage, extremity leads lateral t wave inversion similar to prior of 17 October 2013 II, II and aVf unchanged from first prior appears paced Confirmed by Julius Matus,  Clerance Umland ([id number]) on 10/29/2018 3:15:23 PM   Radiology Dg Chest 2 View  Result Date: 10/29/2018 CLINICAL DATA:  Lower leg swelling.  History of CHF. EXAM: CHEST - 2 VIEW COMPARISON:  09/22/2018 FINDINGS: Lordotic technique demonstrated. Left-sided pacemaker unchanged. Lungs are adequately inflated without focal airspace consolidation or effusion. There is mild prominence of the perihilar markings likely mild degree of vascular congestion. Stable cardiomegaly. Remainder of the exam is unchanged. IMPRESSION: Stable cardiomegaly with suggestion of mild vascular congestion. Electronically Signed   By: Daniel  Boyle M.D.   On: 10/29/2018 15:26    Procedures Procedures (including critical care time)  Medications Ordered in ED Medications  furosemide (LASIX) injection 80 mg (has no administration in time range)     Initial Impression / Assessment and Plan / ED Course  I have reviewed the triage vital signs and the nursing notes.  Pertinent labs & imaging results that were available during my care of the patient were reviewed by me and considered in my medical decision making (see chart for details).     80  mg of Lasix ordered Chest x-Sylvie Mifsud shows no evidence of CHF BNP is elevated at 1095 decreased from the last measured at 1480 Hemoglobin is stable  Plan discharge to home after Lasix given and Unna boots replaced Discussed with Dr. Particia Nearing and she will discharge I have discussed return precautions and need for close follow-up with patient and he voices understanding.  Final Clinical Impressions(s) / ED Diagnoses   Final diagnoses:  Peripheral edema  Chronic  congestive heart failure, unspecified heart failure type [facility])    ED Discharge Orders    None       Margarita Grizzle, MD 10/29/18 1719

## 2018-10-31 ENCOUNTER — Telehealth: Payer: Self-pay | Admitting: Cardiovascular Disease

## 2018-10-31 DIAGNOSIS — R05 Cough: Secondary | ICD-10-CM

## 2018-10-31 DIAGNOSIS — R059 Cough, unspecified: Secondary | ICD-10-CM

## 2018-10-31 NOTE — Telephone Encounter (Addendum)
Unable to leave message, mailbox full. Will try again tomorrow

## 2018-10-31 NOTE — Telephone Encounter (Signed)
New Message    *STAT* If patient is at the pharmacy, call can be transferred to refill team.   1. Which medications need to be refilled? (please list name of each medication and dose if known) chlorpheniramine-HYDROcodone (TUSSIONEX PENNKINETIC ER) 10-8 MG/5ML SUER    2. Which pharmacy/location (including street and city if local pharmacy) is medication to be sent to?[facility] - [place], [place] - [address]  3. Do they need a 30 day or 90 day supply? 30

## 2018-10-31 NOTE — Telephone Encounter (Signed)
Rx refused

## 2018-11-02 NOTE — Telephone Encounter (Signed)
Spoke with Nicolette and this has been addressed by another MD already

## 2018-11-08 ENCOUNTER — Encounter: Payer: Self-pay | Admitting: Cardiovascular Disease

## 2018-11-08 ENCOUNTER — Ambulatory Visit (INDEPENDENT_AMBULATORY_CARE_PROVIDER_SITE_OTHER): Payer: Non-veteran care | Admitting: Cardiovascular Disease

## 2018-11-08 VITALS — BP 100/62 | HR 70 | Ht 67.0 in | Wt 243.0 lb

## 2018-11-08 DIAGNOSIS — E7800 Pure hypercholesterolemia, unspecified: Secondary | ICD-10-CM | POA: Diagnosis not present

## 2018-11-08 DIAGNOSIS — I480 Paroxysmal atrial fibrillation: Secondary | ICD-10-CM | POA: Diagnosis not present

## 2018-11-08 DIAGNOSIS — I5042 Chronic combined systolic (congestive) and diastolic (congestive) heart failure: Secondary | ICD-10-CM

## 2018-11-08 DIAGNOSIS — I2510 Atherosclerotic heart disease of native coronary artery without angina pectoris: Secondary | ICD-10-CM | POA: Diagnosis not present

## 2018-11-08 MED ORDER — FUROSEMIDE 80 MG PO TABS: ORAL_TABLET | ORAL | 3 refills | Status: DC

## 2018-11-08 NOTE — Patient Instructions (Addendum)
Medication Instructions:  TAKE FUROSEMIDE 80 MG IN THE MORNING AS NEEDED IF YOUR WEIGHT IS UP 3 POUNDS IN 24 HOURS, 5 POUNDS IN 1 WEEK, SHORTNESS OF BREATH, OR INCREASED SWELLING   If you need a refill on your cardiac medications before your next appointment, please call your pharmacy.   Lab work: NONE  Testing/Procedures: NONE  Follow-Up: At [facility], you and your health needs are our priority.  As part of our continuing mission to provide you with exceptional heart care, we have created designated Provider Care Teams.  These Care Teams include your primary Cardiologist (physician) and Advanced Practice Providers (APPs -  Physician Assistants and Nurse Practitioners) who all work together to provide you with the care you need, when you need it. You will need a follow up appointment in 2 months. You may see [name], MD or one of the following Advanced Practice Providers on your designated Care Team:   Corine Shelter, PA-C Judy Pimple, [place] . Marjie Skiff, PA-C  You have been referred to HEART FAILURE CLINIC   Any Other Special Instructions Will Be Listed Below (If Applicable). TAKE YOUR SUPPORT STOCKINGS OFF EVERY EVENING

## 2018-11-08 NOTE — Progress Notes (Signed)
Cardiology Office Note   Date:  11/08/2018   ID:  Mohd. Derflinger, DOB 02-17-1934, MRN [id number]  PCP:  [facility]  Cardiologist:   [name], MD   No chief complaint on file.    Patient ID: Ryan Macdonald is a 84 y.o. male with chronic systolic heart failure (LVEF 15%, nonischemic), NSVT, paroxysmal atrial fibrillation, prior PE, hypertension, hyperlipidemia, and CKD III who presents for follow-up.  Mr. Valade was admitted to the hospital 07/2015 with a heart failure exacerbation in the setting of eating Chinese food and missing his evening dose of Lasix.   During that hospitalization he had an 11 beat run of NSVT.  At the time his magnesium level was 1.4.  Discharge weight was 266 lb.  He was also hospitalized for heart failure earlier that year at which time he was also intubated.  Mr. Rieger had an episode of atrial fibrillation postoperatively in 2014.  He was initially on Xarelto but had hematuria, so it was discontinued.  Mr. Martos had a Biotronik ICD implanted at the [facility] on 10/2017.  Since I last saw him 11/2015 he was admitted to the hospital 07/2018 with CAD.  He had a left heart catheterization that revealed 75% proximal LAD stenosis that was treated with a drug-eluting stent.  He had residual 50% left main and ostial left circumflex lesions.  He followed up with [name], DNP on 09/06/2018.  At his last appointment 09/20/18 he was evolemic but was started on daily torsemide because of his frequent need for PRN dosing.  He was admitted with volume overload 2 days later.   He was diuresed with IV lasix and discharged at 244lb.  During that admission carvedilol with switch to metoprolol and isosorbide was held due to hypotension.  He later saw [name], DNP 09/2018.  At that time he had significant lower extremity edema.  She noted that he was adding a lot of salt to his food, including adding salt to the oranges that he  ate daily.  Torsemide was increased to 40mg  x3 days.  He was again admitted 09/2018 with volume overload.  He was diuresed with IV Lasix and Unna boots were placed.   Discharge weight was 236.5 pounds.  He was seen in the ED 12/1 and received IV lasix and was discharged from the ED.  Since leaving the ED Mr. Kester has been feeling well. He got some compression stockings from [place] that have been helpful.  He can now get his house shoes on.  His weight at home has ranged from 223-239lb.  It was 238lb today.  His breathing has been mostly OK.  There are days when he is more short of breath.  He has occasional orthopnea but generally sleeps well.  He has no chest pain.  He has been mindful about salt intake.  He has a home health nurse who should be starting soon.   Past Medical History:  Diagnosis Date  . Atrial fibrillation (HCC)   . Cardiomyopathy (HCC)   . Cat scratch fever    removed mass on right side neck  . Cataracts, bilateral   . CHF (congestive heart failure) (HCC)   . Chronic kidney disease   . Corns and callosities   . Diabetes mellitus without complication (HCC)    TYPE 2  . Dyspnea   . Erythema intertrigo   . Flat foot   . Gout   . High cholesterol   .  Hx of urethral stricture   . Hypertension   . Kidney stones   . Morbid obesity (HCC)   . Nonischemic cardiomyopathy (HCC)    a. ? 2009 Cath in [place] - nl cors per pt;  b. 09/2013 Echo: EF 25-30%, sev diff HK.  . Obesity   . Osteoarthritis   . PAF (paroxysmal atrial fibrillation) (HCC)    a. post-op hip in 2014 - prev on xarelto.  . Renal insufficiency     Past Surgical History:  Procedure Laterality Date  . CARDIAC CATHETERIZATION  1980's  . CIRCUMCISION  1940's  . CORONARY STENT INTERVENTION N/A 08/18/2018   Procedure: CORONARY STENT INTERVENTION;  Surgeon: Corky Crafts, MD;  Location: [facility] INVASIVE CV LAB;  Service: Cardiovascular;  Laterality: N/A;  . CYSTOSCOPY WITH URETHRAL DILATATION  10/23/2013  .  CYSTOSCOPY WITH URETHRAL DILATATION N/A 10/23/2013   Procedure: CYSTOSCOPY WITH URETHRAL DILATATION;  Surgeon: Kathi Ludwig, MD;  Location: [facility] OR;  Service: Urology;  Laterality: N/A;  . INCISION AND DRAINAGE OF WOUND Right 1980's   "cat scratch" (10/23/2013)  . INGUINAL HERNIA REPAIR Right 1950's  . INTRAVASCULAR ULTRASOUND/IVUS N/A 08/18/2018   Procedure: Intravascular Ultrasound/IVUS;  Surgeon: Corky Crafts, MD;  Location: [facility] INVASIVE CV LAB;  Service: Cardiovascular;  Laterality: N/A;  . KIDNEY STONE SURGERY Right 1970's; 1983   "twice"  . LEFT HEART CATH AND CORONARY ANGIOGRAPHY N/A 08/18/2018   Procedure: LEFT HEART CATH AND CORONARY ANGIOGRAPHY;  Surgeon: Corky Crafts, MD;  Location: [facility] INVASIVE CV LAB;  Service: Cardiovascular;  Laterality: N/A;  . TONSILLECTOMY AND ADENOIDECTOMY  1940's  . TOTAL HIP ARTHROPLASTY Left 10/23/2013   Procedure: TOTAL HIP ARTHROPLASTY;  Surgeon: Valeria Batman, MD;  Location: [facility] OR;  Service: Orthopedics;  Laterality: Left;     Current Outpatient Medications  Medication Sig Dispense Refill  . allopurinol (ZYLOPRIM) 100 MG tablet Take 200 mg by mouth daily as needed (for gout symptoms). To prevent gout    . amiodarone (PACERONE) 200 MG tablet Take 1 tablet (200 mg total) by mouth 2 (two) times daily. (Patient taking differently: Take 200 mg by mouth 2 (two) times daily. For heart rhythm control) 60 tablet 0  . apixaban (ELIQUIS) 2.5 MG TABS tablet Take 1 tablet (2.5 mg total) by mouth 2 (two) times daily. (Patient taking differently: Take 2.5 mg by mouth 2 (two) times daily. Caution - blood thinner) 90 tablet 0  . benzonatate (TESSALON) 100 MG capsule Take 1 capsule (100 mg total) by mouth 3 (three) times daily as needed for cough. 180 capsule 1  . chlorpheniramine-HYDROcodone (TUSSIONEX PENNKINETIC ER) 10-8 MG/5ML SUER Take 5 mLs by mouth every 12 (twelve) hours as needed for cough. 140 mL 0  . clopidogrel (PLAVIX) 75 MG tablet  Take 1 tablet (75 mg total) by mouth daily with breakfast. 30 tablet 0  . cyanocobalamin 1000 MCG tablet Take 1,000 mcg by mouth daily.     . diclofenac sodium (VOLTAREN) 1 % GEL Apply 4 g topically 4 (four) times daily. (Patient taking differently: Apply 4 g topically 4 (four) times daily as needed (for pain in the back and both knees). ) 1 Tube 0  . finasteride (PROSCAR) 5 MG tablet Take 5 mg by mouth daily.     . fluticasone furoate-vilanterol (BREO ELLIPTA) 100-25 MCG/INH AEPB Inhale 1 puff into the lungs daily. 60 each 3  . glipiZIDE (GLUCOTROL) 5 MG tablet Take 5 mg by mouth daily before breakfast.    .  guaiFENesin-codeine (GUAIATUSSIN AC) 100-10 MG/5ML syrup Take 5 mLs by mouth 3 (three) times daily as needed for cough.    Marland Kitchen HYDROcodone-acetaminophen (NORCO/VICODIN) 5-325 MG tablet Take 2 tablets by mouth every 4 (four) hours as needed. (Patient taking differently: Take 1 tablet by mouth 2 (two) times daily as needed (for pain). ) 6 tablet 0  . ipratropium-albuterol (DUONEB) 0.5-2.5 (3) MG/3ML SOLN Take 3 mLs by nebulization every 4 (four) hours as needed. (Patient taking differently: Take 3 mLs by nebulization every 4 (four) hours as needed (shortness of breath). ) 360 mL 0  . ketoconazole (NIZORAL) 2 % shampoo Apply 1 application topically 2 (two) times a week.    . metoprolol succinate (TOPROL XL) 100 MG 24 hr tablet Take 1 tablet (100 mg total) by mouth daily. 90 tablet 3  . nitroGLYCERIN (NITROSTAT) 0.4 MG SL tablet Place 1 tablet (0.4 mg total) under the tongue every 5 (five) minutes x 3 doses as needed for chest pain. 25 tablet 0  . PRESCRIPTION MEDICATION Apply 1 application topically daily as needed (pain). ThermaGel    . senna-docusate (SENOKOT S) 8.6-50 MG tablet Take 1 tablet by mouth 2 (two) times daily. 60 tablet 0  . sertraline (ZOLOFT) 25 MG tablet Take 25 mg by mouth daily.     Marland Kitchen torsemide (DEMADEX) 20 MG tablet Take 20 mg by mouth as directed. Take 2 tablets in the morning and  1 in the late afternoon    . furosemide (LASIX) 80 MG tablet 1 TABLET BY MOUTH IN THE MORNING AS NEEDED FOR WT GAIN, SHORTNESS OF BREATH, OR INCREASED SWELLING 90 tablet 3   No current facility-administered medications for this visit.     Allergies:   Lisinopril and Xarelto [rivaroxaban]    Social History:  The patient  reports that he has quit smoking. His smoking use included cigarettes and cigars. He has a 15.00 pack-year smoking history. He has quit using smokeless tobacco. He reports that he does not drink alcohol or use drugs.   Family History:  The patient's family history includes Heart Problems in his maternal grandmother; Heart disease in his mother; Other in his unknown relative.    ROS:  Please see the history of present illness.   Otherwise, review of systems are positive for none.   All other systems are reviewed and negative.    PHYSICAL EXAM: VS:  BP 100/62   Pulse 70   Ht 5\' 7"  (1.702 m)   Wt 243 lb (110.2 kg)   BMI 38.06 kg/m  , BMI Body mass index is 38.06 kg/m. GENERAL:  Well appearing HEENT: Pupils equal round and reactive, fundi not visualized, oral mucosa unremarkable NECK:  No jugular venous distention, waveform within normal limits, carotid upstroke brisk and symmetric, no bruits LUNGS:  Clear to auscultation bilaterally HEART:  RRR.  PMI not displaced or sustained,S1 and S2 within normal limits, no S3, no S4, no clicks, no rubs, no murmurs ABD:  Flat, positive bowel sounds normal in frequency in pitch, no bruits, no rebound, no guarding, no midline pulsatile mass, no hepatomegaly, no splenomegaly EXT:  2 plus pulses throughout, 2+ LE edema to the foot, no cyanosis no clubbing SKIN:  No rashes no nodules NEURO:  Cranial nerves II through XII grossly intact, motor grossly intact throughout PSYCH:  Cognitively intact, oriented to person place and time   EKG:  EKG is not ordered today.  Echocardiogram 04/30/2018 - Left ventricle: The cavity size was  moderately dilated. Wall  thickness was normal. Systolic function was severely reduced. The estimated ejection fraction was in the range of 20% to 25%. Diffuse hypokinesis. Features are consistent with a pseudonormal left ventricular filling pattern, with concomitant abnormal relaxation and increased filling pressure (grade 2 diastolic dysfunction). Doppler parameters are consistent with high ventricular filling pressure. - Aortic valve: There was trivial regurgitation. - Mitral valve: There was mild regurgitation. Valve area by pressure half-time: 1.09 cm^2. - Left atrium: The atrium was mildly dilated. - Right ventricle: Pacer wire or catheter noted in right ventricle. Systolic function was reduced.   Cardiac Cath 08/18/2018 Conclusion     Mid LM to Dist LM lesion is 40% stenosed. Cross sectional area 6.7 cm2.  Ost Cx lesion is 50% stenosed. Cross sectional area 5.5 cm2.  Mid LAD lesion is 50% stenosed.  LV end diastolic pressure is normal. LVEDP 12 mm Hg.  There is no aortic valve stenosis.  Prox LAD lesion is 75% stenosed. Severely calcified. Cross sectional area 3.2 and heavily calcified.  A drug-eluting stent was successfully placed using a STENT SYNERGY DES 3X12, postdilated to 3.3 mm.  Post intervention, there is a 0% residual stenosis.    Recommend to resume Apixaban, at currently prescribed dose and frequency, on 08/19/18. Recommend concurrent antiplatelet therapy of Aspirin 81mg  daily for 1 monthand Clopidogrel 75mg  daily for 6 months.    Recent Labs: 10/19/2018: ALT 33 10/22/2018: Magnesium 1.9 10/29/2018: B Natriuretic Peptide 1,095.3; BUN 29; Creatinine, Ser 1.87; Hemoglobin 10.0; Platelets 293; Potassium 3.5; Sodium 140    Lipid Panel    Component Value Date/Time   CHOL 159 08/15/2018 0207   TRIG 89 08/15/2018 0207   HDL 32 (L) 08/15/2018 0207   CHOLHDL 5.0 08/15/2018 0207   VLDL 18 08/15/2018 0207   LDLCALC 109 (H)  08/15/2018 0207      Wt Readings from Last 3 Encounters:  11/08/18 243 lb (110.2 kg)  10/29/18 244 lb 11.4 oz (111 kg)  10/22/18 237 lb 1.6 oz (107.5 kg)    Echo 08/22/15: Study Conclusions  - Left ventricle: The cavity size was severely dilated. Systolic function was severely reduced. The estimated ejection fraction was in the range of 15-20%. Diffuse hypokinesis. - Aortic valve: There was trivial regurgitation. - Mitral valve: There was mild regurgitation. - Right ventricle: Systolic function was normal. - Right atrium: The atrium was normal in size. - Tricuspid valve: There was trivial regurgitation.   Other studies Reviewed: Additional studies/ records that were reviewed today include: . Review of the above records demonstrates:  Please see elsewhere in the note.     ASSESSMENT AND PLAN:  # Chronic systolic and diastolic heart failure: Mr. Boehl is doing OK now.  His breathing is stable and he has only pedal edema.  However he has had multiple recurrent hospitalizations and ED visits for volume overload.  He struggles to take compressions stockings off so he keeps them on for days at a time.  We discussed the importance of taking them off at night.  Carvedilol, hydralazine and Imdur have been stopped  2/2 hypotension. Continue metoprolol.  Renal function is tenuous.  We will refer him to the heart failure clinic to assist with medication management and hopefully prevent hospitalizations.  He may benefit from digoxin but his renal function is labile.   # CAD s/p PCI: # Hyperlipidemia: Mr. Hopps had a drug-eluting stent placed 07/2018.  He is no longer on aspirin.  He will continue clopidogrel through 01/2019.  Continue metoprolol.  It is unclear why rosuvastatin was discontinued.  Need to address at follow up.  # ICD: No ICD firing.  He would like to have his device management her clinic once he is cleared by the [place].  # Paroxysmal atrial fibrillation: Hematuria on  Xarelto.  He is doing well on Eliquis.  He has had recurrent atrial fibrillation since his probe operative episode.  Continue metoprolol.  He was previously on amiodarone. This patients CHA2DS2-VASc Score and unadjusted Ischemic Stroke Rate (% per year) is equal to 9.7 % stroke rate/year from a score of 6  Above score calculated as 1 point each if present [CHF, HTN, DM, Vascular=MI/PAD/Aortic Plaque, Age if 65-74, or Male] Above score calculated as 2 points each if present [Age > 75, or Stroke/TIA/TE]    Current medicines are reviewed at length with the patient today.  The patient does not have concerns regarding medicines.  The following changes have been made:  no change  Labs/ tests ordered today include:   Orders Placed This Encounter  Procedures  . AMB referral to CHF clinic     Disposition:   FU with Mellony Danziger C. Duke Salvia, MD in 2 months.  HF clinic in 1 month.   Signed, [name], MD  11/08/2018 10:19 AM    [facility]

## 2018-11-23 ENCOUNTER — Emergency Department (HOSPITAL_COMMUNITY): Payer: No Typology Code available for payment source

## 2018-11-23 ENCOUNTER — Encounter (HOSPITAL_COMMUNITY): Payer: Self-pay

## 2018-11-23 ENCOUNTER — Inpatient Hospital Stay (HOSPITAL_COMMUNITY)
Admission: EM | Admit: 2018-11-23 | Discharge: 2018-11-28 | DRG: 291 | Disposition: A | Discharge status: Home / Self Care | Payer: No Typology Code available for payment source | Attending: Cardiology | Admitting: Cardiology

## 2018-11-23 ENCOUNTER — Other Ambulatory Visit: Payer: Self-pay

## 2018-11-23 DIAGNOSIS — E785 Hyperlipidemia, unspecified: Secondary | ICD-10-CM | POA: Diagnosis present

## 2018-11-23 DIAGNOSIS — N183 Chronic kidney disease, stage 3 (moderate): Secondary | ICD-10-CM | POA: Diagnosis not present

## 2018-11-23 DIAGNOSIS — Z9981 Dependence on supplemental oxygen: Secondary | ICD-10-CM | POA: Diagnosis not present

## 2018-11-23 DIAGNOSIS — Z955 Presence of coronary angioplasty implant and graft: Secondary | ICD-10-CM | POA: Diagnosis not present

## 2018-11-23 DIAGNOSIS — E8770 Fluid overload, unspecified: Secondary | ICD-10-CM

## 2018-11-23 DIAGNOSIS — I5021 Acute systolic (congestive) heart failure: Secondary | ICD-10-CM | POA: Diagnosis present

## 2018-11-23 DIAGNOSIS — I252 Old myocardial infarction: Secondary | ICD-10-CM | POA: Diagnosis not present

## 2018-11-23 DIAGNOSIS — E1122 Type 2 diabetes mellitus with diabetic chronic kidney disease: Secondary | ICD-10-CM | POA: Diagnosis present

## 2018-11-23 DIAGNOSIS — J9611 Chronic respiratory failure with hypoxia: Secondary | ICD-10-CM | POA: Diagnosis present

## 2018-11-23 DIAGNOSIS — I428 Other cardiomyopathies: Secondary | ICD-10-CM | POA: Diagnosis not present

## 2018-11-23 DIAGNOSIS — E7800 Pure hypercholesterolemia, unspecified: Secondary | ICD-10-CM | POA: Diagnosis present

## 2018-11-23 DIAGNOSIS — Z8249 Family history of ischemic heart disease and other diseases of the circulatory system: Secondary | ICD-10-CM

## 2018-11-23 DIAGNOSIS — J449 Chronic obstructive pulmonary disease, unspecified: Secondary | ICD-10-CM | POA: Diagnosis present

## 2018-11-23 DIAGNOSIS — R0602 Shortness of breath: Secondary | ICD-10-CM

## 2018-11-23 DIAGNOSIS — R609 Edema, unspecified: Secondary | ICD-10-CM

## 2018-11-23 DIAGNOSIS — I2510 Atherosclerotic heart disease of native coronary artery without angina pectoris: Secondary | ICD-10-CM | POA: Diagnosis not present

## 2018-11-23 DIAGNOSIS — I130 Hypertensive heart and chronic kidney disease with heart failure and stage 1 through stage 4 chronic kidney disease, or unspecified chronic kidney disease: Secondary | ICD-10-CM | POA: Diagnosis present

## 2018-11-23 DIAGNOSIS — I959 Hypotension, unspecified: Secondary | ICD-10-CM | POA: Diagnosis not present

## 2018-11-23 DIAGNOSIS — I5043 Acute on chronic combined systolic (congestive) and diastolic (congestive) heart failure: Secondary | ICD-10-CM | POA: Diagnosis present

## 2018-11-23 DIAGNOSIS — F329 Major depressive disorder, single episode, unspecified: Secondary | ICD-10-CM | POA: Diagnosis present

## 2018-11-23 DIAGNOSIS — Z79899 Other long term (current) drug therapy: Secondary | ICD-10-CM

## 2018-11-23 DIAGNOSIS — M109 Gout, unspecified: Secondary | ICD-10-CM | POA: Diagnosis present

## 2018-11-23 DIAGNOSIS — I440 Atrioventricular block, first degree: Secondary | ICD-10-CM | POA: Diagnosis present

## 2018-11-23 DIAGNOSIS — Z87891 Personal history of nicotine dependence: Secondary | ICD-10-CM

## 2018-11-23 DIAGNOSIS — Z7901 Long term (current) use of anticoagulants: Secondary | ICD-10-CM

## 2018-11-23 DIAGNOSIS — Z96642 Presence of left artificial hip joint: Secondary | ICD-10-CM | POA: Diagnosis present

## 2018-11-23 DIAGNOSIS — I509 Heart failure, unspecified: Secondary | ICD-10-CM | POA: Diagnosis not present

## 2018-11-23 DIAGNOSIS — Z7984 Long term (current) use of oral hypoglycemic drugs: Secondary | ICD-10-CM

## 2018-11-23 DIAGNOSIS — Z888 Allergy status to other drugs, medicaments and biological substances status: Secondary | ICD-10-CM

## 2018-11-23 DIAGNOSIS — I480 Paroxysmal atrial fibrillation: Secondary | ICD-10-CM | POA: Diagnosis present

## 2018-11-23 DIAGNOSIS — I340 Nonrheumatic mitral (valve) insufficiency: Secondary | ICD-10-CM | POA: Diagnosis not present

## 2018-11-23 DIAGNOSIS — I2583 Coronary atherosclerosis due to lipid rich plaque: Secondary | ICD-10-CM | POA: Diagnosis not present

## 2018-11-23 DIAGNOSIS — Z95810 Presence of automatic (implantable) cardiac defibrillator: Secondary | ICD-10-CM | POA: Diagnosis not present

## 2018-11-23 LAB — COMPREHENSIVE METABOLIC PANEL
ALT: 19 U/L (ref 0–44)
AST: 30 U/L (ref 15–41)
Albumin: 2.8 g/dL — ABNORMAL LOW (ref 3.5–5.0)
Alkaline Phosphatase: 54 U/L (ref 38–126)
Anion gap: 12 (ref 5–15)
BUN: 24 mg/dL — ABNORMAL HIGH (ref 8–23)
CO2: 26 mmol/L (ref 22–32)
Calcium: 9 mg/dL (ref 8.9–10.3)
Chloride: 101 mmol/L (ref 98–111)
Creatinine, Ser: 2.01 mg/dL — ABNORMAL HIGH (ref 0.61–1.24)
GFR, EST AFRICAN AMERICAN: 35 mL/min — AB (ref >60)
GFR, EST NON AFRICAN AMERICAN: 30 mL/min — AB (ref >60)
Glucose, Bld: 113 mg/dL — ABNORMAL HIGH (ref 70–99)
Potassium: 3.7 mmol/L (ref 3.5–5.1)
Sodium: 139 mmol/L (ref 135–145)
TOTAL PROTEIN: 7.3 g/dL (ref 6.5–8.1)
Total Bilirubin: 1.1 mg/dL (ref 0.3–1.2)

## 2018-11-23 LAB — URINALYSIS, ROUTINE W REFLEX MICROSCOPIC
BILIRUBIN URINE: NEGATIVE
Glucose, UA: NEGATIVE mg/dL
Ketones, ur: NEGATIVE mg/dL
Nitrite: NEGATIVE
Protein, ur: NEGATIVE mg/dL
RBC / HPF: >50 RBC/hpf — ABNORMAL HIGH (ref 0–5)
Specific Gravity, Urine: 1.008 (ref 1.005–1.030)
WBC, UA: >50 WBC/hpf — ABNORMAL HIGH (ref 0–5)
pH: 6 (ref 5.0–8.0)

## 2018-11-23 LAB — CBC WITH DIFFERENTIAL/PLATELET
Abs Immature Granulocytes: 0.02 10*3/uL (ref 0.00–0.07)
Basophils Absolute: 0 10*3/uL (ref 0.0–0.1)
Basophils Relative: 1 %
Eosinophils Absolute: 0.1 10*3/uL (ref 0.0–0.5)
Eosinophils Relative: 2 %
HEMATOCRIT: 35.9 % — AB (ref 39.0–52.0)
Hemoglobin: 11 g/dL — ABNORMAL LOW (ref 13.0–17.0)
Immature Granulocytes: 0 %
LYMPHS ABS: 1.3 10*3/uL (ref 0.7–4.0)
Lymphocytes Relative: 22 %
MCH: 28.3 pg (ref 26.0–34.0)
MCHC: 30.6 g/dL (ref 30.0–36.0)
MCV: 92.3 fL (ref 80.0–100.0)
Monocytes Absolute: 0.6 10*3/uL (ref 0.1–1.0)
Monocytes Relative: 10 %
Neutro Abs: 3.7 10*3/uL (ref 1.7–7.7)
Neutrophils Relative %: 65 %
Platelets: 236 10*3/uL (ref 150–400)
RBC: 3.89 MIL/uL — ABNORMAL LOW (ref 4.22–5.81)
RDW: 17.9 % — ABNORMAL HIGH (ref 11.5–15.5)
WBC: 5.8 10*3/uL (ref 4.0–10.5)
nRBC: 0 % (ref 0.0–0.2)

## 2018-11-23 LAB — I-STAT TROPONIN, ED
Troponin i, poc: 0.03 ng/mL (ref 0.00–0.08)
Troponin i, poc: 0.04 ng/mL (ref 0.00–0.08)

## 2018-11-23 LAB — GLUCOSE, CAPILLARY: Glucose-Capillary: 58 mg/dL — ABNORMAL LOW (ref 70–99)

## 2018-11-23 LAB — BRAIN NATRIURETIC PEPTIDE: B Natriuretic Peptide: 1382.9 pg/mL — ABNORMAL HIGH (ref 0.0–100.0)

## 2018-11-23 LAB — I-STAT CG4 LACTIC ACID, ED
Lactic Acid, Venous: 2.44 mmol/L (ref 0.5–1.9)
Lactic Acid, Venous: 1.48 mmol/L (ref 0.5–1.9)

## 2018-11-23 LAB — MAGNESIUM: Magnesium: 1.7 mg/dL (ref 1.7–2.4)

## 2018-11-23 MED ORDER — VITAMIN B-12 1000 MCG PO TABS
1000.0000 ug | ORAL_TABLET | Freq: Every day | ORAL | Status: DC
  Administered 2018-11-24 – 2018-11-28 (×5): 1000 ug via ORAL
  Filled 2018-11-23 (×5): qty 1

## 2018-11-23 MED ORDER — FUROSEMIDE 10 MG/ML IJ SOLN
20.0000 mg/h | INTRAVENOUS | Status: DC
  Administered 2018-11-24: 20 mg/h via INTRAVENOUS
  Filled 2018-11-23 (×3): qty 25
  Filled 2018-11-23: qty 21

## 2018-11-23 MED ORDER — FUROSEMIDE 10 MG/ML IJ SOLN
40.0000 mg | Freq: Once | INTRAMUSCULAR | Status: AC
  Administered 2018-11-24: 40 mg via INTRAVENOUS
  Filled 2018-11-23: qty 4

## 2018-11-23 MED ORDER — CLOPIDOGREL BISULFATE 75 MG PO TABS: 75.0000 mg | ORAL_TABLET | Freq: Every day | ORAL | Status: DC

## 2018-11-23 MED ORDER — SENNOSIDES-DOCUSATE SODIUM 8.6-50 MG PO TABS
1.0000 | ORAL_TABLET | ORAL | Status: DC | PRN
  Administered 2018-11-26 – 2018-11-28 (×2): 1 via ORAL
  Filled 2018-11-23 (×2): qty 1

## 2018-11-23 MED ORDER — AMIODARONE HCL 200 MG PO TABS
200.0000 mg | ORAL_TABLET | Freq: Two times a day (BID) | ORAL | Status: DC
  Administered 2018-11-24 – 2018-11-27 (×8): 200 mg via ORAL
  Filled 2018-11-23 (×8): qty 1

## 2018-11-23 MED ORDER — FLUTICASONE FUROATE-VILANTEROL 100-25 MCG/INH IN AEPB
1.0000 | INHALATION_SPRAY | Freq: Every day | RESPIRATORY_TRACT | Status: DC
  Administered 2018-11-24 – 2018-11-28 (×5): 1 via RESPIRATORY_TRACT
  Filled 2018-11-23: qty 28

## 2018-11-23 MED ORDER — ALLOPURINOL 100 MG PO TABS: 200.0000 mg | ORAL_TABLET | Freq: Every day | ORAL | Status: DC | PRN

## 2018-11-23 MED ORDER — SODIUM CHLORIDE 0.9% FLUSH
3.0000 mL | Freq: Two times a day (BID) | INTRAVENOUS | Status: DC
  Administered 2018-11-24 – 2018-11-28 (×10): 3 mL via INTRAVENOUS

## 2018-11-23 MED ORDER — FINASTERIDE 5 MG PO TABS
5.0000 mg | ORAL_TABLET | Freq: Every day | ORAL | Status: DC
  Administered 2018-11-24 – 2018-11-28 (×5): 5 mg via ORAL
  Filled 2018-11-23 (×5): qty 1

## 2018-11-23 MED ORDER — MAGNESIUM SULFATE IN D5W 1-5 GM/100ML-% IV SOLN
1.0000 g | Freq: Once | INTRAVENOUS | Status: AC
  Administered 2018-11-24: 1 g via INTRAVENOUS
  Filled 2018-11-23: qty 100

## 2018-11-23 MED ORDER — SODIUM CHLORIDE 0.9% FLUSH: 3.0000 mL | INTRAVENOUS | Status: DC | PRN

## 2018-11-23 MED ORDER — METOPROLOL SUCCINATE ER 100 MG PO TB24: 100.0000 mg | ORAL_TABLET | Freq: Every day | ORAL | Status: DC

## 2018-11-23 MED ORDER — BENZONATATE 100 MG PO CAPS
100.0000 mg | ORAL_CAPSULE | Freq: Three times a day (TID) | ORAL | Status: DC | PRN
  Administered 2018-11-24 – 2018-11-28 (×7): 100 mg via ORAL
  Filled 2018-11-23 (×8): qty 1

## 2018-11-23 MED ORDER — SERTRALINE HCL 50 MG PO TABS
25.0000 mg | ORAL_TABLET | Freq: Every day | ORAL | Status: DC
  Administered 2018-11-24 – 2018-11-28 (×5): 25 mg via ORAL
  Filled 2018-11-23 (×5): qty 1

## 2018-11-23 MED ORDER — APIXABAN 2.5 MG PO TABS
2.5000 mg | ORAL_TABLET | Freq: Two times a day (BID) | ORAL | Status: DC
  Administered 2018-11-24 – 2018-11-28 (×10): 2.5 mg via ORAL
  Filled 2018-11-23 (×10): qty 1

## 2018-11-23 MED ORDER — POTASSIUM CHLORIDE CRYS ER 20 MEQ PO TBCR
40.0000 meq | EXTENDED_RELEASE_TABLET | Freq: Once | ORAL | Status: AC
  Administered 2018-11-23: 40 meq via ORAL
  Filled 2018-11-23: qty 2

## 2018-11-23 MED ORDER — IPRATROPIUM-ALBUTEROL 0.5-2.5 (3) MG/3ML IN SOLN: 3.0000 mL | RESPIRATORY_TRACT | Status: DC | PRN

## 2018-11-23 MED ORDER — NITROGLYCERIN 0.4 MG SL SUBL: 0.4000 mg | SUBLINGUAL_TABLET | SUBLINGUAL | Status: DC | PRN

## 2018-11-23 MED ORDER — FUROSEMIDE 10 MG/ML IJ SOLN
40.0000 mg | Freq: Once | INTRAMUSCULAR | Status: AC
  Administered 2018-11-23: 40 mg via INTRAVENOUS
  Filled 2018-11-23: qty 4

## 2018-11-23 MED ORDER — SODIUM CHLORIDE 0.9 % IV SOLN
250.0000 mL | INTRAVENOUS | Status: DC | PRN
  Administered 2018-11-24: 250 mL via INTRAVENOUS

## 2018-11-23 MED ORDER — HYDROCODONE-ACETAMINOPHEN 5-325 MG PO TABS
1.0000 | ORAL_TABLET | Freq: Two times a day (BID) | ORAL | Status: DC | PRN
  Administered 2018-11-26 – 2018-11-28 (×4): 1 via ORAL
  Filled 2018-11-23 (×4): qty 1

## 2018-11-23 NOTE — ED Notes (Signed)
Attempted to call report

## 2018-11-23 NOTE — ED Triage Notes (Signed)
Pt brought in by EMS due to having SOB and bilateral lower leg swelling for 3 days. Pt denies chest pain or cough.

## 2018-11-23 NOTE — ED Provider Notes (Signed)
[name] Provider [facility]   CSN: [id number] Arrival date & time: 11/23/18  1426     History   Chief Complaint Chief Complaint  Patient presents with  . Shortness of Breath  . Leg Swelling    HPI Ryan Macdonald is a 84 y.o. male.  The history is provided by the patient and medical records. No language interpreter was used.  Shortness of Breath  This is a recurrent problem. The average episode lasts 3 days. The problem occurs continuously.The current episode started more than 2 days ago. The problem has not changed since onset.Associated symptoms include leg swelling. Pertinent negatives include no fever, no headaches, no rhinorrhea, no neck pain, no cough, no sputum production, no wheezing, no chest pain, no syncope, no vomiting, no abdominal pain, no rash and no leg pain. He has tried nothing for the symptoms. Associated medical issues include chronic lung disease, PE, CAD, heart failure and past MI.    Past Medical History:  Diagnosis Date  . Atrial fibrillation (HCC)   . Cardiomyopathy (HCC)   . Cat scratch fever    removed mass on right side neck  . Cataracts, bilateral   . CHF (congestive heart failure) (HCC)   . Chronic kidney disease   . Corns and callosities   . Diabetes mellitus without complication (HCC)    TYPE 2  . Dyspnea   . Erythema intertrigo   . Flat foot   . Gout   . High cholesterol   . Hx of urethral stricture   . Hypertension   . Kidney stones   . Morbid obesity (HCC)   . Nonischemic cardiomyopathy (HCC)    a. ? 2009 Cath in [place] - nl cors per pt;  b. 09/2013 Echo: EF 25-30%, sev diff HK.  . Obesity   . Osteoarthritis   . PAF (paroxysmal atrial fibrillation) (HCC)    a. post-op hip in 2014 - prev on xarelto.  . Renal insufficiency     Patient Active Problem List   Diagnosis Date Noted  . Acute on chronic heart failure (HCC) 10/19/2018  . Pulmonary emphysema (HCC)   . Chronic respiratory failure with  hypoxia (HCC)   . Morbid obesity (HCC) 09/09/2018  . URI (upper respiratory infection) 08/31/2018  . UTI (urinary tract infection) 08/31/2018  . Acute on chronic systolic (congestive) heart failure (HCC) 08/28/2018  . CHF (congestive heart failure) (HCC) 08/14/2018  . Acute on chronic combined systolic and diastolic congestive heart failure (HCC) 07/18/2018  . First degree AV block   . Shortness of breath 04/29/2018  . Acute HFrEF (heart failure with reduced ejection fraction) (HCC) 04/29/2018  . Acute respiratory failure with hypoxia (HCC) 09/27/2017  . CHF exacerbation (HCC) 09/27/2017  . Renal insufficiency   . Acute on chronic systolic CHF (congestive heart failure) (HCC) 07/08/2017  . Left knee pain 07/08/2017  . Acute bronchitis 05/10/2017  . Acute congestive heart failure (HCC) 04/14/2017  . Elevated troponin 04/14/2017  . Lactic acidosis   . Acute on chronic systolic heart failure, NYHA class 2 (HCC) 02/03/2017  . Diabetes mellitus type 2 in obese (HCC) 02/03/2017  . CAP (community acquired pneumonia) 02/03/2017  . Lobar pneumonia (HCC)   . Diabetes mellitus with complication (HCC)   . Acute on chronic respiratory failure with hypoxia (HCC)   . Acute on chronic combined systolic and diastolic heart failure (HCC) 05/19/2016  . Depression 05/19/2016  . Gout 05/19/2016  . ICD (implantable cardioverter-defibrillator) in place  12/17/2015  . NSVT (nonsustained ventricular tachycardia) (HCC)   . Chronic renal insufficiency, stage 3 (moderate) (HCC)   . Pulmonary embolism (HCC)   . Acute respiratory failure (HCC)   . Osteoarthritis of left hip 10/25/2013  . Atrial fibrillation (HCC) 10/25/2013  . S/P hip replacement 10/24/2013  . Nonischemic cardiomyopathy (HCC)   . Obesity (BMI 30-39.9)   . Essential hypertension   . Hyperlipidemia     Past Surgical History:  Procedure Laterality Date  . CARDIAC CATHETERIZATION  1980's  . CIRCUMCISION  1940's  . CORONARY STENT  INTERVENTION N/A 08/18/2018   Procedure: CORONARY STENT INTERVENTION;  Surgeon: Corky Crafts, MD;  Location: [facility] INVASIVE CV LAB;  Service: Cardiovascular;  Laterality: N/A;  . CYSTOSCOPY WITH URETHRAL DILATATION  10/23/2013  . CYSTOSCOPY WITH URETHRAL DILATATION N/A 10/23/2013   Procedure: CYSTOSCOPY WITH URETHRAL DILATATION;  Surgeon: Kathi Ludwig, MD;  Location: [facility] OR;  Service: Urology;  Laterality: N/A;  . INCISION AND DRAINAGE OF WOUND Right 1980's   "cat scratch" (10/23/2013)  . INGUINAL HERNIA REPAIR Right 1950's  . INTRAVASCULAR ULTRASOUND/IVUS N/A 08/18/2018   Procedure: Intravascular Ultrasound/IVUS;  Surgeon: Corky Crafts, MD;  Location[facility] INVASIVE CV LAB;  Service: Cardiovascular;  Laterality: N/A;  . KIDNEY STONE SURGERY Right 1970's; 1983   "twice"  . LEFT HEART CATH AND CORONARY ANGIOGRAPHY N/A 08/18/2018   Procedure: LEFT HEART CATH AND CORONARY ANGIOGRAPHY;  Surgeon: Corky Crafts, MD;  Location: [facility] INVASIVE CV LAB;  Service: Cardiovascular;  Laterality: N/A;  . TONSILLECTOMY AND ADENOIDECTOMY  1940's  . TOTAL HIP ARTHROPLASTY Left 10/23/2013   Procedure: TOTAL HIP ARTHROPLASTY;  Surgeon: Valeria Batman, MD;  Location: [facility] OR;  Service: Orthopedics;  Laterality: Left;        Home Medications    Prior to Admission medications   Medication Sig Start Date End Date Taking? Authorizing Provider  allopurinol (ZYLOPRIM) 100 MG tablet Take 200 mg by mouth daily as needed (for gout symptoms). To prevent gout    [provider]  amiodarone (PACERONE) 200 MG tablet Take 1 tablet (200 mg total) by mouth 2 (two) times daily. Patient taking differently: Take 200 mg by mouth 2 (two) times daily. For heart rhythm control 02/16/18   Burna Cash, MD  apixaban (ELIQUIS) 2.5 MG TABS tablet Take 1 tablet (2.5 mg total) by mouth 2 (two) times daily. Patient taking differently: Take 2.5 mg by mouth 2 (two) times daily. Caution - blood thinner  05/04/18   Angelita Ingles, MD  benzonatate (TESSALON) 100 MG capsule Take 1 capsule (100 mg total) by mouth 3 (three) times daily as needed for cough. 10/19/18   Jodelle Gross, NP  chlorpheniramine-HYDROcodone [facility] PENNKINETIC ER) 10-8 MG/5ML SUER Take 5 mLs by mouth every 12 (twelve) hours as needed for cough. 09/20/18   [name], MD  clopidogrel (PLAVIX) 75 MG tablet Take 1 tablet (75 mg total) by mouth daily with breakfast. 08/20/18   Laverda Page B, NP  cyanocobalamin 1000 MCG tablet Take 1,000 mcg by mouth daily.     [provider]  diclofenac sodium (VOLTAREN) 1 % GEL Apply 4 g topically 4 (four) times daily. Patient taking differently: Apply 4 g topically 4 (four) times daily as needed (for pain in the back and both knees).  07/10/17   Valentino Nose, MD  finasteride (PROSCAR) 5 MG tablet Take 5 mg by mouth daily.     [provider]  fluticasone furoate-vilanterol (BREO ELLIPTA) 100-25  MCG/INH AEPB Inhale 1 puff into the lungs daily. 05/05/18   Angelita Ingles, MD  furosemide (LASIX) 80 MG tablet 1 TABLET BY MOUTH IN THE MORNING AS NEEDED FOR WT GAIN, SHORTNESS OF BREATH, OR INCREASED SWELLING 11/08/18   [name], MD  glipiZIDE (GLUCOTROL) 5 MG tablet Take 5 mg by mouth daily before breakfast.    [provider]  guaiFENesin-codeine (GUAIATUSSIN AC) 100-10 MG/5ML syrup Take 5 mLs by mouth 3 (three) times daily as needed for cough.    [provider]  HYDROcodone-acetaminophen (NORCO/VICODIN) 5-325 MG tablet Take 2 tablets by mouth every 4 (four) hours as needed. Patient taking differently: Take 1 tablet by mouth 2 (two) times daily as needed (for pain).  04/09/16   Isa Rankin, MD  ipratropium-albuterol (DUONEB) 0.5-2.5 (3) MG/3ML SOLN Take 3 mLs by nebulization every 4 (four) hours as needed. Patient taking differently: Take 3 mLs by nebulization every 4 (four) hours as needed (shortness of breath).  05/14/17   Pearson Grippe, MD  ketoconazole (NIZORAL) 2 % shampoo Apply 1 application topically 2 (two) times a week.    [provider]  metoprolol succinate (TOPROL XL) 100 MG 24 hr tablet Take 1 tablet (100 mg total) by mouth daily. 10/04/18   [name], MD  nitroGLYCERIN (NITROSTAT) 0.4 MG SL tablet Place 1 tablet (0.4 mg total) under the tongue every 5 (five) minutes x 3 doses as needed for chest pain. 08/19/18   Arty Baumgartner, NP  PRESCRIPTION MEDICATION Apply 1 application topically daily as needed (pain). ThermaGel    [provider]  senna-docusate (SENOKOT S) 8.6-50 MG tablet Take 1 tablet by mouth 2 (two) times daily. 09/10/18   Levora Dredge, MD  sertraline (ZOLOFT) 25 MG tablet Take 25 mg by mouth daily.     [provider]  torsemide (DEMADEX) 20 MG tablet Take 20 mg by mouth as directed. Take 2 tablets in the morning and 1 in the late afternoon    [provider]    Family History Family History  Problem Relation Age of Onset  . Heart disease Mother   . Heart Problems Maternal Grandmother   . Other Other        negative for premature CAD    Social History Social History   Tobacco Use  . Smoking status: Former Smoker    Packs/day: 1.00    Years: 15.00    Pack years: 15.00    Types: Cigarettes, Cigars  . Smokeless tobacco: Former Neurosurgeon  . Tobacco comment: quit smoking ~ 50 yr ago  Substance Use Topics  . Alcohol use: No    Alcohol/week: 2.0 standard drinks    Types: 2 Cans of beer per week    Comment: rare beer.  . Drug use: No     Allergies   Lisinopril and Xarelto [rivaroxaban]   Review of Systems Review of Systems  Constitutional: Positive for fatigue. Negative for chills, diaphoresis and fever.  HENT: Negative for congestion and rhinorrhea.   Eyes: Negative for visual disturbance.  Respiratory: Positive for chest tightness and shortness of breath. Negative for cough, sputum production, wheezing and stridor.     Cardiovascular: Positive for leg swelling. Negative for chest pain, palpitations and syncope.  Gastrointestinal: Negative for abdominal pain, constipation, diarrhea, nausea and vomiting.  Genitourinary: Negative for dysuria and flank pain.  Musculoskeletal: Negative for back pain, neck pain and neck stiffness.  Skin: Negative for rash and wound.  Neurological: Negative for headaches.  Psychiatric/Behavioral: Negative for agitation.  All other systems reviewed and are negative.    Physical Exam Updated Vital Signs BP (!) 107/93 (BP Location: Right Arm)   Pulse 71   Temp 99.2 F (37.3 C) (Oral)   Resp 16   Ht 5\' 7"  (1.702 m)   Wt 108.9 kg   SpO2 96%   BMI 37.59 kg/m   Physical Exam Vitals signs and nursing note reviewed.  Constitutional:      General: He is not in acute distress.    Appearance: He is well-developed. He is not ill-appearing, toxic-appearing or diaphoretic.  HENT:     Head: Normocephalic and atraumatic.  Eyes:     Conjunctiva/sclera: Conjunctivae normal.     Pupils: Pupils are equal, round, and reactive to light.  Neck:     Musculoskeletal: Neck supple.  Cardiovascular:     Rate and Rhythm: Normal rate and regular rhythm.     Heart sounds: Murmur present.  Pulmonary:     Effort: Pulmonary effort is normal. Tachypnea present. No respiratory distress.     Breath sounds: No stridor. Rales present. No decreased breath sounds, wheezing or rhonchi.  Abdominal:     Palpations: Abdomen is soft.     Tenderness: There is no abdominal tenderness.  Musculoskeletal:     Right lower leg: He exhibits no tenderness. Edema present.     Left lower leg: He exhibits no tenderness. Edema present.  Skin:    General: Skin is warm and dry.     Capillary Refill: Capillary refill takes less than 2 seconds.  Neurological:     Mental Status: He is alert.      ED Treatments / Results  Labs (all labs ordered are listed, but only abnormal results are displayed) Labs  Reviewed  CBC WITH DIFFERENTIAL/PLATELET - Abnormal; Notable for the following components:      Result Value   RBC 3.89 (*)    Hemoglobin 11.0 (*)    HCT 35.9 (*)    RDW 17.9 (*)    All other components within normal limits  COMPREHENSIVE METABOLIC PANEL - Abnormal; Notable for the following components:   Glucose, Bld 113 (*)    BUN 24 (*)    Creatinine, Ser 2.01 (*)    Albumin 2.8 (*)    GFR calc non Af Amer 30 (*)    GFR calc Af Amer 35 (*)    All other components within normal limits  BRAIN NATRIURETIC PEPTIDE - Abnormal; Notable for the following components:   B Natriuretic Peptide 1,382.9 (*)    All other components within normal limits  URINALYSIS, ROUTINE W REFLEX MICROSCOPIC - Abnormal; Notable for the following components:   APPearance HAZY (*)    Hgb urine dipstick LARGE (*)    Leukocytes, UA LARGE (*)    RBC / HPF >50 (*)    WBC, UA >50 (*)    Bacteria, UA FEW (*)    All other components within normal limits  GLUCOSE, CAPILLARY - Abnormal; Notable for the following components:   Glucose-Capillary 58 (*)    All other components within normal limits  I-STAT CG4 LACTIC ACID, ED - Abnormal; Notable for the following components:   Lactic Acid, Venous 2.44 (*)    All other components within normal limits  URINE CULTURE  MAGNESIUM  CBC WITH DIFFERENTIAL/PLATELET  COMPREHENSIVE METABOLIC PANEL  TSH  I-STAT TROPONIN, ED  I-STAT CG4 LACTIC ACID, ED  I-STAT TROPONIN, ED    EKG EKG Interpretation  Date/Time:  Thursday November 23 2018 15:51:22 EST Ventricular Rate:  70 PR Interval:    QRS Duration: 187 QT Interval:  528 QTC Calculation: 570 R Axis:   -90 Text Interpretation:  Ventricular-paced rhythm No further analysis attempted due to paced rhythm Paced rhyth, No STEMI Confirmed by Theda Belfast ([id number]) on 11/23/2018 3:53:03 PM   Radiology Dg Chest 2 View  Result Date: 11/23/2018 CLINICAL DATA:  Leg swelling.  History of CHF. EXAM: CHEST - 2 VIEW  COMPARISON:  October 29, 2018 FINDINGS: Stable cardiomegaly. The hila and mediastinum are unchanged. No pneumothorax. Stable pacemaker. No pulmonary nodules or masses. No focal infiltrates. Probable tiny effusions on the lateral view. Pulmonary venous congestion without overt edema. IMPRESSION: Cardiomegaly, probably tiny pleural effusions, and pulmonary venous congestion. Electronically Signed   By: Gerome Sam III M.D   On: 11/23/2018 16:32    Procedures Procedures (including critical care time)  Medications Ordered in ED Medications  sodium chloride flush (NS) 0.9 % injection 3 mL (has no administration in time range)  sodium chloride flush (NS) 0.9 % injection 3 mL (has no administration in time range)  0.9 %  sodium chloride infusion (has no administration in time range)  allopurinol (ZYLOPRIM) tablet 200 mg (has no administration in time range)  amiodarone (PACERONE) tablet 200 mg (200 mg Oral Given 11/24/18 0021)  apixaban (ELIQUIS) tablet 2.5 mg (2.5 mg Oral Given 11/24/18 0022)  benzonatate (TESSALON) capsule 100 mg (has no administration in time range)  clopidogrel (PLAVIX) tablet 75 mg (has no administration in time range)  vitamin B-12 (CYANOCOBALAMIN) tablet 1,000 mcg (has no administration in time range)  finasteride (PROSCAR) tablet 5 mg (has no administration in time range)  fluticasone furoate-vilanterol (BREO ELLIPTA) 100-25 MCG/INH 1 puff (has no administration in time range)  HYDROcodone-acetaminophen (NORCO/VICODIN) 5-325 MG per tablet 1 tablet (has no administration in time range)  metoprolol succinate (TOPROL-XL) 24 hr tablet 100 mg (has no administration in time range)  sertraline (ZOLOFT) tablet 25 mg (has no administration in time range)  senna-docusate (Senokot-S) tablet 1 tablet (has no administration in time range)  ipratropium-albuterol (DUONEB) 0.5-2.5 (3) MG/3ML nebulizer solution 3 mL (has no administration in time range)  nitroGLYCERIN (NITROSTAT) SL  tablet 0.4 mg (has no administration in time range)  furosemide (LASIX) 250 mg in dextrose 5 % 250 mL (1 mg/mL) infusion (has no administration in time range)  magnesium sulfate IVPB 1 g 100 mL (has no administration in time range)  furosemide (LASIX) injection 40 mg (40 mg Intravenous Given 11/23/18 1809)  potassium chloride SA (K-DUR,KLOR-CON) CR tablet 40 mEq (40 mEq Oral Given 11/23/18 2321)  furosemide (LASIX) injection 40 mg (40 mg Intravenous Given 11/24/18 0022)     Initial Impression / Assessment and Plan / ED Course  I have reviewed the triage vital signs and the nursing notes.  Pertinent labs & imaging results that were available during my care of the patient were reviewed by me and considered in my medical decision making (see chart for details).     Ryan Macdonald is a 84 y.o. male with a past medical history significant for CHF (EF of 20%) with ICD/pacer, hypertension, hypercholesterolemia, atrial fibrillation on Eliquis, CAD status post PCI 07/2018, diabetes, prior pulmonary embolism, and CKD who presents with worsening shortness of breath and peripheral edema.  Patient says that over the last 3 days he has had worsening fluid overload.  He says that his legs have been more swollen bilaterally causing him to  have more shortness of breath.  He reports that he is having no chest pain but feels that he is getting more fluid overloaded.  He denies any recent change in his medications and has been avoiding all salty foods over the holidays.  He denies recent trauma.  He denies fevers, chills, congestion, or significant cough.  He denies nausea, vomiting, abdominal pain, urinary symptoms or GI symptoms.  He reports that he has oxygen at home which she has been using at home 2 L.  He was told to come back to emergency department if he ever gets more short of breath or more edematous despite medications.  On exam, patient has crackles in all lung fields.  He is winded when finishing  conversation.  Chest is nontender, abdomen is nontender.  Legs are edematous bilaterally, nontender.  Abdomen nontender.  Clinical suspect patient is having fluid overload and CHF exacerbation.  Lower suspicion for pneumonia.  With lack of chest pain, have lower suspicion for ACS however given recent MI with PCI, will obtain troponin and other cardiac work-up.  Given worsening of symptoms despite home medications, anticipate admission.  5:18 PM  Patient's work-up began to return.  Initial troponin negative.  Lactic acid elevated at 2.44.  CBC shows no leukocytosis and similar anemia.  Metabolic panel shows slight increase in creatinine up to 2.01.  BNP however is more elevated at 1382.  Magnesium normal.  Chest x-ray confirms what exam was suspicious for with pulmonary venous congestion, tiny pleural effusions as well as cardiomegaly.  On patient reassessment, his blood pressure still in the 90s and low 100s systolic.  I am concerned about aggressive diuresis and discharge home as he has had in the past due to the low blood pressures.  Due to this concern with his persistent shortness of breath and worsening edema despite adherence to home medication regimen, do not feel patient is safe for discharge home.  Interrogation of pacemaker revealed no arrhythmias per pacemaker tech verbal report.  As patient is on Lasix and torsemide, will give a IV Lasix dose.  Patient's blood pressure still on the soft side, will need to gently diurese him.  Due to severe heart failure, cardiology be called.  Anticipate admission.   Patient will be admitted to cardiology for further management and diuresis.   Final Clinical Impressions(s) / ED Diagnoses   Final diagnoses:  Peripheral edema  Hypervolemia, unspecified hypervolemia type  Shortness of breath    ED Discharge Orders    None      Clinical Impression: 1. Peripheral edema   2. Hypervolemia, unspecified hypervolemia type   3. Shortness of  breath     Disposition: Admit  This note was prepared with assistance of Dragon voice recognition software. Occasional wrong-word or sound-a-like substitutions may have occurred due to the inherent limitations of voice recognition software.     Shacara Cozine, Canary Brim, MD 11/24/18 [phone]

## 2018-11-23 NOTE — ED Notes (Signed)
Called [facility] per rn

## 2018-11-24 ENCOUNTER — Inpatient Hospital Stay (HOSPITAL_COMMUNITY): Payer: No Typology Code available for payment source

## 2018-11-24 ENCOUNTER — Other Ambulatory Visit: Payer: Self-pay

## 2018-11-24 DIAGNOSIS — I340 Nonrheumatic mitral (valve) insufficiency: Secondary | ICD-10-CM

## 2018-11-24 DIAGNOSIS — Z95810 Presence of automatic (implantable) cardiac defibrillator: Secondary | ICD-10-CM

## 2018-11-24 DIAGNOSIS — I5043 Acute on chronic combined systolic (congestive) and diastolic (congestive) heart failure: Secondary | ICD-10-CM

## 2018-11-24 DIAGNOSIS — I509 Heart failure, unspecified: Secondary | ICD-10-CM

## 2018-11-24 LAB — CBC WITH DIFFERENTIAL/PLATELET
Abs Immature Granulocytes: 0.02 10*3/uL (ref 0.00–0.07)
Basophils Absolute: 0 10*3/uL (ref 0.0–0.1)
Basophils Relative: 0 %
Eosinophils Absolute: 0.1 10*3/uL (ref 0.0–0.5)
Eosinophils Relative: 2 %
HCT: 32 % — ABNORMAL LOW (ref 39.0–52.0)
HEMOGLOBIN: 10.3 g/dL — AB (ref 13.0–17.0)
Immature Granulocytes: 0 %
Lymphocytes Relative: 20 %
Lymphs Abs: 1 10*3/uL (ref 0.7–4.0)
MCH: 29.5 pg (ref 26.0–34.0)
MCHC: 32.2 g/dL (ref 30.0–36.0)
MCV: 91.7 fL (ref 80.0–100.0)
MONOS PCT: 11 %
Monocytes Absolute: 0.6 10*3/uL (ref 0.1–1.0)
Neutro Abs: 3.4 10*3/uL (ref 1.7–7.7)
Neutrophils Relative %: 67 %
Platelets: 244 10*3/uL (ref 150–400)
RBC: 3.49 MIL/uL — ABNORMAL LOW (ref 4.22–5.81)
RDW: 17.8 % — ABNORMAL HIGH (ref 11.5–15.5)
WBC: 5.1 10*3/uL (ref 4.0–10.5)
nRBC: 0 % (ref 0.0–0.2)

## 2018-11-24 LAB — TROPONIN I
Troponin I: 0.03 ng/mL (ref <0.03)
Troponin I: 0.03 ng/mL (ref <0.03)
Troponin I: 0.03 ng/mL (ref <0.03)

## 2018-11-24 LAB — COMPREHENSIVE METABOLIC PANEL
ALK PHOS: 52 U/L (ref 38–126)
ALT: 19 U/L (ref 0–44)
AST: 28 U/L (ref 15–41)
Albumin: 2.6 g/dL — ABNORMAL LOW (ref 3.5–5.0)
Anion gap: 13 (ref 5–15)
BUN: 23 mg/dL (ref 8–23)
CALCIUM: 8.8 mg/dL — AB (ref 8.9–10.3)
CO2: 26 mmol/L (ref 22–32)
CREATININE: 1.88 mg/dL — AB (ref 0.61–1.24)
Chloride: 101 mmol/L (ref 98–111)
GFR calc Af Amer: 38 mL/min — ABNORMAL LOW (ref >60)
GFR calc non Af Amer: 33 mL/min — ABNORMAL LOW (ref >60)
Glucose, Bld: 125 mg/dL — ABNORMAL HIGH (ref 70–99)
Potassium: 3.7 mmol/L (ref 3.5–5.1)
Sodium: 140 mmol/L (ref 135–145)
Total Bilirubin: 0.8 mg/dL (ref 0.3–1.2)
Total Protein: 6.8 g/dL (ref 6.5–8.1)

## 2018-11-24 LAB — GLUCOSE, CAPILLARY
Glucose-Capillary: 149 mg/dL — ABNORMAL HIGH (ref 70–99)
Glucose-Capillary: 154 mg/dL — ABNORMAL HIGH (ref 70–99)
Glucose-Capillary: 81 mg/dL (ref 70–99)

## 2018-11-24 LAB — ECHOCARDIOGRAM COMPLETE
Height: 67 in
Weight: 3712 oz

## 2018-11-24 LAB — TSH: TSH: 18.521 u[IU]/mL — ABNORMAL HIGH (ref 0.350–4.500)

## 2018-11-24 MED ORDER — POTASSIUM CHLORIDE CRYS ER 20 MEQ PO TBCR
40.0000 meq | EXTENDED_RELEASE_TABLET | Freq: Once | ORAL | Status: AC
  Administered 2018-11-24: 40 meq via ORAL
  Filled 2018-11-24: qty 2

## 2018-11-24 MED ORDER — CLOPIDOGREL BISULFATE 75 MG PO TABS
75.0000 mg | ORAL_TABLET | Freq: Every day | ORAL | Status: DC
  Administered 2018-11-24 – 2018-11-28 (×5): 75 mg via ORAL
  Filled 2018-11-24 (×5): qty 1

## 2018-11-24 MED ORDER — GUAIFENESIN-DM 100-10 MG/5ML PO SYRP
5.0000 mL | ORAL_SOLUTION | ORAL | Status: DC | PRN
  Administered 2018-11-25 – 2018-11-28 (×8): 5 mL via ORAL
  Filled 2018-11-24 (×8): qty 5

## 2018-11-24 MED ORDER — INSULIN ASPART 100 UNIT/ML ~~LOC~~ SOLN
0.0000 [IU] | Freq: Three times a day (TID) | SUBCUTANEOUS | Status: DC
  Administered 2018-11-24: 3 [IU] via SUBCUTANEOUS
  Administered 2018-11-26 – 2018-11-27 (×4): 2 [IU] via SUBCUTANEOUS
  Administered 2018-11-28: 3 [IU] via SUBCUTANEOUS

## 2018-11-24 MED ORDER — INSULIN ASPART 100 UNIT/ML ~~LOC~~ SOLN: 0.0000 [IU] | Freq: Every day | SUBCUTANEOUS | Status: DC

## 2018-11-24 MED ORDER — ROSUVASTATIN CALCIUM 5 MG PO TABS
10.0000 mg | ORAL_TABLET | Freq: Every day | ORAL | Status: DC
  Administered 2018-11-24 – 2018-11-27 (×4): 10 mg via ORAL
  Filled 2018-11-24 (×6): qty 2

## 2018-11-24 NOTE — Consult Note (Addendum)
Cardiology Consultation:   Patient ID: Ryan Macdonald MRN: [id number]; DOB: [birth year]  Admit date: 11/23/2018 Date of Consult: 11/24/2018  [facility]: [facility] Primary Cardiologist: Chilton Siiffany Driscoll, MD  Primary ElectrLance Coonophysiologist:  [facility]   Patient Profile:   Lance CoonFred Oatley is a 84 y.o. male with a hx of NICM, subsequently found to have CAD (s/p PCI to LAD w/DES 07/2018), chronic CHF (systolic), NSVT, paroxysmal AFib, h/o PE, HTN, HLD, CKD (III) who is being seen today for the evaluation of ICD upgrade to CRT device at the request of K. Korger, PA.   Device information: Biotronik ICD, single lead (VDR lead) [facility] 11[facility]19/[facility]   History of Present Illness:   [name]  has had a number of hospitalizations this year with CHF exacerbations, and in Sept as well with PCI to his LAD.  It has been difficult to get and keep him on GDMT 2/2 to hypotension in review of records.  He last saw Dr. Duke Salviaandolph 11/08/18, she noted   07/2018 hospitalized with CAD.  He had a left heart catheterization that revealed 75% proximal LAD stenosis that was treated with a drug-eluting stent.  He had residual 50% left main and ostial left circumflex lesions. 09/20/18 at an OV he was evolemic but was started on daily torsemide because of his frequent need for PRN dosing, hospitalized with decompensated HF and diuresed. 09/2018, outpatient again noted salt liberalization and marked edema, again admitted with volume overload.  He was diuresed with IV Lasix and Unna boots were placed 10/29/18 an ER  Visit with a single dose of IV lasix and discharged Re hospitalized again now with acute/chronic CHF exacerbation.  EP is asked to weigh in on device upgrade options given difficulty with CHF management  HOME meds: Amiodarone 200mg  BID Eliquis 2.5mg  BID Plavix Toprol 100mg  daily torsemide 40AM, 20PM    Past Medical History:  Diagnosis Date  . Atrial fibrillation  (HCC)   . Cardiomyopathy (HCC)   . Cat scratch fever    removed mass on right side neck  . Cataracts, bilateral   . CHF (congestive heart failure) (HCC)   . Chronic kidney disease   . Corns and callosities   . Diabetes mellitus without complication (HCC)    TYPE 2  . Dyspnea   . Erythema intertrigo   . Flat foot   . Gout   . High cholesterol   . Hx of urethral stricture   . Hypertension   . Kidney stones   . Morbid obesity (HCC)   . Nonischemic cardiomyopathy (HCC)    a. ? 2009 Cath in [place] - nl cors per pt;  b. 09/2013 Echo: EF 25-30%, sev diff HK.  . Obesity   . Osteoarthritis   . PAF (paroxysmal atrial fibrillation) (HCC)    a. post-op hip in 2014 - prev on xarelto.  . Renal insufficiency     Past Surgical History:  Procedure Laterality Date  . CARDIAC CATHETERIZATION  1980's  . CIRCUMCISION  1940's  . CORONARY STENT INTERVENTION N/A 08/18/2018   Procedure: CORONARY STENT INTERVENTION;  Surgeon: Corky CraftsVaranasi, Jayadeep S, MD;  [facility];  Service: Cardiovascular;  Laterality: N/A;  . CYSTOSCOPY WITH URETHRAL DILATATION  10/23/2013  . CYSTOSCOPY WITH URETHRAL DILATATION N/A 10/23/2013   Procedure: CYSTOSCOPY WITH URETHRAL DILATATION;  Surgeon: Kathi LudwigSigmund I Tannenbaum, MD;  [facility];  Service: Urology;  Laterality: N/A;  . INCISION AND DRAINAGE OF WOUND Right 1980's   "cat  scratch" (10/23/2013)  . INGUINAL HERNIA REPAIR Right 1950's  . INTRAVASCULAR ULTRASOUND/IVUS N/A 08/18/2018   Procedure: Intravascular Ultrasound/IVUS;  Surgeon: Corky Crafts, MD;  Location: [facility] INVASIVE CV LAB;  Service: Cardiovascular;  Laterality: N/A;  . KIDNEY STONE SURGERY Right 1970's; 1983   "twice"  . LEFT HEART CATH AND CORONARY ANGIOGRAPHY N/A 08/18/2018   Procedure: LEFT HEART CATH AND CORONARY ANGIOGRAPHY;  Surgeon: Corky Crafts, MD;  Location: [facility] INVAS[facility] CV LAB;  Service: Cardiovascular;  Laterality: N/A;  . TONSILLECTOMY AND ADENOIDECTOMY  1940's  . TOTAL  HIP ARTHROPLASTY Left 10/23/2013   Procedure: TOTAL HIP ARTHROPLASTY;  Surgeon: Valeria Batman, MD;  Location: [facility] OR;  Service: Orthopedics;  Laterality: Left;     Home Medications:  Prior to Admission medications   Medication Sig Start Date End Date Taking? Authorizing Provider  allopurinol (ZYLOPRIM) 100 MG tablet Take 200 mg by mouth daily as needed (for gout symptoms).    Yes [provider]  amiodarone (PACERONE) 200 MG tablet Take 1 tablet (200 mg total) by mouth 2 (two) times daily. Patient taking differently: Take 200 mg by mouth 2 (two) times daily. For heart rhythm control 02/16/18  Yes Santos-Sanchez, Chelsea Primus, MD  apixaban (ELIQUIS) 2.5 MG TABS tablet Take 1 tablet (2.5 mg total) by mouth 2 (two) times daily. Patient taking differently: Take 2.5 mg by mouth 2 (two) times daily. Caution - blood thinner 05/04/18  Yes Winfrey, Kimberlee Nearing, MD  benzonatate (TESSALON) 100 MG capsule Take 1 capsule (100 mg total) by mouth 3 (three) times daily as needed for cough. Patient taking differently: Take 100 mg by mouth every 12 (twelve) hours.  10/19/18  Yes Jodelle Gross, NP  chlorpheniramine-HYDROcodone [facility]) 10-8 MG/5ML SUER Take 5 mLs by mouth every 12 (twelve) hours as needed for cough. 09/20/18  Yes [name], MD  clopidogrel (PLAVIX) 75 MG tablet Take 1 tablet (75 mg total) by mouth daily with breakfast. 08/20/18   Laverda Page B, NP  cyanocobalamin 1000 MCG tablet Take 1,000 mcg by mouth daily.     [provider]  diclofenac sodium (VOLTAREN) 1 % GEL Apply 4 g topically 4 (four) times daily. Patient taking differently: Apply 4 g topically 4 (four) times daily as needed (for pain in the back and both knees).  07/10/17   Valentino Nose, MD  finasteride (PROSCAR) 5 MG tablet Take 5 mg by mouth daily.     [provider]  fluticasone furoate-vilanterol (BREO ELLIPTA) 100-25 MCG/INH AEPB Inhale 1 puff into the lungs daily. 05/05/18    Angelita Ingles, MD  furosemide (LASIX) 80 MG tablet 1 TABLET BY MOUTH IN THE MORNING AS NEEDED FOR WT GAIN, SHORTNESS OF BREATH, OR INCREASED SWELLING 11/08/18   [name], MD  glipiZIDE (GLUCOTROL) 5 MG tablet Take 5 mg by mouth daily before breakfast.    [provider]  guaiFENesin-codeine (GUAIATUSSIN AC) 100-10 MG/5ML syrup Take 5 mLs by mouth 3 (three) times daily as needed for cough.    [provider]  HYDROcodone-acetaminophen (NORCO/VICODIN) 5-325 MG tablet Take 2 tablets by mouth every 4 (four) hours as needed. Patient taking differently: Take 1 tablet by mouth 2 (two) times daily as needed (for pain).  04/09/16   Isa Rankin, MD  ipratropium-albuterol (DUONEB) 0.5-2.5 (3) MG/3ML SOLN Take 3 mLs by nebulization every 4 (four) hours as needed. Patient taking differently: Take 3 mLs by nebulization every 4 (four) hours as needed (shortness of breath).  05/14/17   Pearson Grippe, MD  ketoconazole (NIZORAL) 2 % shampoo Apply 1 application topically 2 (two) times a week.    [provider]  metoprolol succinate (TOPROL XL) 100 MG 24 hr tablet Take 1 tablet (100 mg total) by mouth daily. 10/04/18   [name], MD  nitroGLYCERIN (NITROSTAT) 0.4 MG SL tablet Place 1 tablet (0.4 mg total) under the tongue every 5 (five) minutes x 3 doses as needed for chest pain. 08/19/18   Arty Baumgartner, NP  PRESCRIPTION MEDICATION Apply 1 application topically daily as needed (pain). ThermaGel    [provider]  senna-docusate (SENOKOT S) 8.6-50 MG tablet Take 1 tablet by mouth 2 (two) times daily. 09/10/18   Levora Dredge, MD  sertraline (ZOLOFT) 25 MG tablet Take 25 mg by mouth daily.     [provider]  torsemide (DEMADEX) 20 MG tablet Take 20 mg by mouth as directed. Take 2 tablets in the morning and 1 in the late afternoon    [provider]    Inpatient Medications: Scheduled Meds: . amiodarone  200 mg Oral BID  . apixaban  2.5  mg Oral BID  . clopidogrel  75 mg Oral Q breakfast  . finasteride  5 mg Oral Daily  . fluticasone furoate-vilanterol  1 puff Inhalation Daily  . insulin aspart  0-15 Units Subcutaneous TID WC  . insulin aspart  0-5 Units Subcutaneous QHS  . potassium chloride  40 mEq Oral Once  . rosuvastatin  10 mg Oral q1800  . sertraline  25 mg Oral Daily  . sodium chloride flush  3 mL Intravenous Q12H  . cyanocobalamin  1,000 mcg Oral Daily   Continuous Infusions: . sodium chloride Stopped (11/24/18 0131)  . furosemide (LASIX) infusion 20 mg/hr (11/24/18 0254)   PRN Meds: sodium chloride, allopurinol, benzonatate, HYDROcodone-acetaminophen, ipratropium-albuterol, nitroGLYCERIN, senna-docusate, sodium chloride flush  Allergies:    Allergies  Allergen Reactions  . Lisinopril Swelling    Facial swelling  . Xarelto [Rivaroxaban] Other (See Comments)    "Blood came out of my penis"    Social History:   Social History   Socioeconomic History  . Marital status: Married    Spouse name: Not on file  . Number of children: Not on file  . Years of education: Not on file  . Highest education level: Not on file  Occupational History  . Occupation: Retired    Comment: Former Patent examiner  . Financial resource strain: Not on file  . Food insecurity:    Worry: Not on file    Inability: Not on file  . Transportation needs:    Medical: Not on file    Non-medical: Not on file  Tobacco Use  . Smoking status: Former Smoker    Packs/day: 1.00    Years: 15.00    Pack years: 15.00    Types: Cigarettes, Cigars  . Smokeless tobacco: Former Neurosurgeon  . Tobacco comment: quit smoking ~ 50 yr ago  Substance and Sexual Activity  . Alcohol use: No    Alcohol/week: 2.0 standard drinks    Types: 2 Cans of beer per week    Comment: rare beer.  . Drug use: No  . Sexual activity: Yes  Lifestyle  . Physical activity:    Days per week: Not on file    Minutes per session: Not on file  . Stress: Not on  file  Relationships  . Social connections:    Talks on phone: Not  on file    Gets together: Not on file    Attends religious service: Not on file    Active member of club or organization: Not on file    Attends meetings of clubs or organizations: Not on file    Relationship status: Not on file  . Intimate partner violence:    Fear of current or ex partner: No    Emotionally abused: No    Physically abused: No    Forced sexual activity: No  Other Topics Concern  . Not on file  Social History Narrative   Lives in [place] with wife. Moved from [place] (2013). Does not routinely exercise.    Family History:   Family History  Problem Relation Age of Onset  . Heart disease Mother   . Heart Problems Maternal Grandmother   . Other Other        negative for premature CAD     ROS:  Please see the history of present illness.  All other ROS reviewed and negative.     Physical Exam/Data:   Vitals:   11/23/18 2240 11/24/18 0603 11/24/18 0840 11/24/18 0929  BP: 111/69 102/62 120/77   Pulse: (!) 53 (!) 51 60   Resp: 20 20 18    Temp: 98.6 F (37 C) 98.6 F (37 C) (!) 97.5 F (36.4 C)   TempSrc: Oral Oral Oral   SpO2: 97% 94% 99% 98%  Weight: 105.2 kg 105.2 kg    Height: 5\' 7"  (1.702 m)       Intake/Output Summary (Last 24 hours) at 11/24/2018 0951 Last data filed at 11/24/2018 0700 Gross per 24 hour  Intake 276.66 ml  Output 852 ml  Net -575.34 ml   Filed Weights   11/23/18 1428 11/23/18 2240 11/24/18 0603  Weight: 108.9 kg 105.2 kg 105.2 kg   Body mass index is 36.34 kg/m.  General:  Well nourished, well developed, in no acute distress HEENT: normal Lymph: no adenopathy Neck: no JVD Endocrine:  No thryomegaly Vascular: No carotid bruits; FA pulses 2+ bilaterally without bruits  Cardiac:  RRR; no murmurs, gallops or rubs Lungs:  Diminished at the bases, no wheezing, rhonchi or rales  Abd: soft, nontender Ext: 2+ edema Musculoskeletal:  No deformities Skin:  warm and dry  Neuro:  no focal abnormalities noted Psych:  Normal affect   EKG:  The EKG was personally reviewed and demonstrates:   V paced, diffiult to tell baseline rhythm, looks asynchronously paced Telemetry:  Telemetry was personally reviewed and demonstrates:   SR, rare paced beat  Relevant CV Studies:  08/18/18:  LHC/PCI  Mid LM to Dist LM lesion is 40% stenosed. Cross sectional area 6.7 cm2.  Ost Cx lesion is 50% stenosed. Cross sectional area 5.5 cm2.  Mid LAD lesion is 50% stenosed.  LV end diastolic pressure is normal. LVEDP 12 mm Hg.  There is no aortic valve stenosis.  Prox LAD lesion is 75% stenosed. Severely calcified. Cross sectional area 3.2 and heavily calcified.  A drug-eluting stent was successfully placed using a STENT SYNERGY DES 3X12, postdilated to 3.3 mm.  Post intervention, there is a 0% residual stenosis.  Recommend to resume Apixaban, at currently prescribed dose and frequency, on 08/19/18.  Recommend concurrent antiplatelet therapy of Aspirin 81mg  daily for 1 month and Clopidogrel 75mg  daily for 6 months.    Initially given Brilinta for quick onset but he had some nausea and vomiting post cath.  IV tirofiban given.  Will give clopidogrel 300  mg x1 followed by 75 mg daily.   04/30/18: TTE Study Conclusions - Left ventricle: The cavity size was moderately dilated. Wall   thickness was normal. Systolic function was severely reduced. The   estimated ejection fraction was in the range of 20% to 25%.   Diffuse hypokinesis. Features are consistent with a pseudonormal   left ventricular filling pattern, with concomitant abnormal   relaxation and increased filling pressure (grade 2 diastolic   dysfunction). Doppler parameters are consistent with high   ventricular filling pressure. - Aortic valve: There was trivial regurgitation. - Mitral valve: There was mild regurgitation. Valve area by   pressure half-time: 1.09 cm^2. - Left atrium: The atrium was  mildly dilated. - Right ventricle: Pacer wire or catheter noted in right ventricle.   Systolic function was reduced.   Laboratory Data:  Chemistry Recent Labs  Lab 11/23/18 1516 11/24/18 0208  NA 139 140  K 3.7 3.7  CL 101 101  CO2 26 26  GLUCOSE 113* 125*  BUN 24* 23  CREATININE 2.01* 1.88*  CALCIUM 9.0 8.8*  GFRNONAA 30* 33*  GFRAA 35* 38*  ANIONGAP 12 13    Recent Labs  Lab 11/23/18 1516 11/24/18 0208  PROT 7.3 6.8  ALBUMIN 2.8* 2.6*  AST 30 28  ALT 19 19  ALKPHOS 54 52  BILITOT 1.1 0.8   Hematology Recent Labs  Lab 11/23/18 1516 11/24/18 0208  WBC 5.8 5.1  RBC 3.89* 3.49*  HGB 11.0* 10.3*  HCT 35.9* 32.0*  MCV 92.3 91.7  MCH 28.3 29.5  MCHC 30.6 32.2  RDW 17.9* 17.8*  PLT 236 244   Cardiac Enzymes Recent Labs  Lab 11/24/18 0208 11/24/18 0612  TROPONINI 0.03* 0.03*    Recent Labs  Lab 11/23/18 1522 11/23/18 1833  TROPIPOC 0.03 0.04    BNP Recent Labs  Lab 11/23/18 1516  BNP 1,382.9*    DDimer No results for input(s): DDIMER in the last 168 hours.  Radiology/Studies:   Dg Chest 2 View Result Date: 11/23/2018 CLINICAL DATA:  Leg swelling.  History of CHF. EXAM: CHEST - 2 VIEW COMPARISON:  October 29, 2018 FINDINGS: Stable cardiomegaly. The hila and mediastinum are unchanged. No pneumothorax. Stable pacemaker. No pulmonary nodules or masses. No focal infiltrates. Probable tiny effusions on the lateral view. Pulmonary venous congestion without overt edema. IMPRESSION: Cardiomegaly, probably tiny pleural effusions, and pulmonary venous congestion. Electronically Signed   By: Gerome Sam III M.D   On: 11/23/2018 16:32    Assessment and Plan:   1. Recurrent CHF exacerbations     Continue management with primary cardiology team 2. CAD     S/p DES Sept 2019 3. AFib (paroxysmal)     CHA2DS2Vasc is 5, on Eliquis appropriately low dose with age/creat    4.  Implanted P synchronous pacing defibrillator programmed at high ventricular  pacing rates      I have reviewed his EKGs for the last several months, all are paced His device check from yesterday (hand written report by rep) Programmed on interrogation at VVI 70, 7.5V @ 1.44ms No episodes, battery and lead measurements are good Noted that base rate programming had been changed in July at [place] to VVI 70/max outputs Was programmed yesterday to VDI 50, 2.5V/0.47ms  In communication with the device rep, who was familiar with the patient from prior checks Reports that the device appeared to have been programmed to VVI 70 with max output settings by for unclear reasons Lead thresholds are good (  reported at 0.9V/0.70ms), no pacing history until that programming (he suspects perhaps for a procedure and not returned to base programming)  His QRS currently on telemetry is quite narrow.  I suspect a CRT device will not be of help and wonder if his RV pacing (and asynchronously) may have been contributing to some degree to his difficulty with HF of late. He thinks he may have had an MRI done about the time of the prior device programming in July.  I have asked to have an EKG done, if indeed, his QRS is narrow without pacing will not benefit from CRT  Dr. Graciela Husbands will see   For questions or updates, please contact [facility] Please consult [email or web address] for contact info under     Signed, Sheilah Pigeon, PA-C  11/24/2018 9:51 AM  As above.  The patient does have significant heart failure over recent months concurrent with the pacing feature of his defibrillator programmed and high output and high rates resulting in asynchronous pacing and dyssynchronous contraction.  Device interrogation is allowed for reprogramming so that P synchronous pacing is on demand mode and he is conducting on his own with a PR interval of about 250 ms and a QRS duration about 110 ms.  Hopefully these 2 features, AV synchrony and synchronous ventricular contraction will result in improved cardiac  performance and mitigation of heart failure.  We are available going forward to follow the patient's defibrillator pacemaker system.  Currently he is followed at the [facility] but also in conjunction with Dr. Duke Salvia.  I will forward this to her and will be available if they and when he returns for follow-up, they can let us know if they would like to have device management transfer to our service  Call us if he can be of further help

## 2018-11-24 NOTE — Progress Notes (Addendum)
Progress Note  Patient Name: Ryan Macdonald Date of Encounter: 11/24/2018  Primary Cardiologist: Chilton Siiffany Cameron, MD   Subjective   Reports improvement in his breathing and LE edema today. Denies chest pain or palpitations.   Inpatient Medications    Scheduled Meds: . amiodarone  200 mg Oral BID  . apixaban  2.5 mg Oral BID  . clopidogrel  75 mg Oral Q breakfast  . finasteride  5 mg Oral Daily  . fluticasone furoate-vilanterol  1 puff Inhalation Daily  . insulin aspart  0-15 Units Subcutaneous TID WC  . insulin aspart  0-5 Units Subcutaneous QHS  . rosuvastatin  10 mg Oral q1800  . sertraline  25 mg Oral Daily  . sodium chloride flush  3 mL Intravenous Q12H  . cyanocobalamin  1,000 mcg Oral Daily   Continuous Infusions: . sodium chloride Stopped (11/24/18 0131)  . furosemide (LASIX) infusion 20 mg/hr (11/24/18 0254)   PRN Meds: sodium chloride, allopurinol, benzonatate, HYDROcodone-acetaminophen, ipratropium-albuterol, nitroGLYCERIN, senna-docusate, sodium chloride flush   Vital Signs    Vitals:   11/23/18 2100 11/23/18 2130 11/23/18 2240 11/24/18 0603  BP: 105/65 123/67 111/69 102/62  Pulse: 65 67 (!) 53 (!) 51  Resp: 17 16 20 20   Temp:   98.6 F (37 C) 98.6 F (37 C)  TempSrc:   Oral Oral  SpO2: 99% 100% 97% 94%  Weight:   105.2 kg 105.2 kg  Height:   5\' 7"  (1.702 m)     Intake/Output Summary (Last 24 hours) at 11/24/2018 0809 Last data filed at 11/24/2018 0700 Gross per 24 hour  Intake 276.66 ml  Output 852 ml  Net -575.34 ml   Filed Weights   11/23/18 1428 11/23/18 2240 11/24/18 0603  Weight: 108.9 kg 105.2 kg 105.2 kg    Telemetry    Paced rhythm - Personally Reviewed  Physical Exam   GEN: Laying in bed in no acute distress.   Neck: No JVD, no carotid bruits Cardiac: RRR, no murmurs, rubs, or gallops.  Respiratory: Clear to auscultation bilaterally, no wheezes/ rales/ rhonchi GI: NABS, Soft, nontender, non-distended  MS: 3+ LE edema;  No deformity. Neuro:  Nonfocal, moving all extremities spontaneously Psych: Normal affect   Labs    Chemistry Recent Labs  Lab 11/23/18 1516 11/24/18 0208  NA 139 140  K 3.7 3.7  CL 101 101  CO2 26 26  GLUCOSE 113* 125*  BUN 24* 23  CREATININE 2.01* 1.88*  CALCIUM 9.0 8.8*  PROT 7.3 6.8  ALBUMIN 2.8* 2.6*  AST 30 28  ALT 19 19  ALKPHOS 54 52  BILITOT 1.1 0.8  GFRNONAA 30* 33*  GFRAA 35* 38*  ANIONGAP 12 13     Hematology Recent Labs  Lab 11/23/18 1516 11/24/18 0208  WBC 5.8 5.1  RBC 3.89* 3.49*  HGB 11.0* 10.3*  HCT 35.9* 32.0*  MCV 92.3 91.7  MCH 28.3 29.5  MCHC 30.6 32.2  RDW 17.9* 17.8*  PLT 236 244    Cardiac Enzymes Recent Labs  Lab 11/24/18 0208 11/24/18 0612  TROPONINI 0.03* 0.03*    Recent Labs  Lab 11/23/18 1522 11/23/18 1833  TROPIPOC 0.03 0.04     BNP Recent Labs  Lab 11/23/18 1516  BNP 1,382.9*     DDimer No results for input(s): DDIMER in the last 168 hours.   Radiology    Dg Chest 2 View  Result Date: 11/23/2018 CLINICAL DATA:  Leg swelling.  History of CHF. EXAM: CHEST - 2 VIEW  COMPARISON:  October 29, 2018 FINDINGS: Stable cardiomegaly. The hila and mediastinum are unchanged. No pneumothorax. Stable pacemaker. No pulmonary nodules or masses. No focal infiltrates. Probable tiny effusions on the lateral view. Pulmonary venous congestion without overt edema. IMPRESSION: Cardiomegaly, probably tiny pleural effusions, and pulmonary venous congestion. Electronically Signed   By: Gerome Sam III M.D   On: 11/23/2018 16:32    Cardiac Studies   Left heart catheterization: 07/2018  Mid LM to Dist LM lesion is 40% stenosed. Cross sectional area 6.7 cm2.  Ost Cx lesion is 50% stenosed. Cross sectional area 5.5 cm2.  Mid LAD lesion is 50% stenosed.  LV end diastolic pressure is normal. LVEDP 12 mm Hg.  There is no aortic valve stenosis.  Prox LAD lesion is 75% stenosed. Severely calcified. Cross sectional area 3.2 and  heavily calcified.  A drug-eluting stent was successfully placed using a STENT SYNERGY DES 3X12, postdilated to 3.3 mm.  Post intervention, there is a 0% residual stenosis.   Recommend to resume Apixaban, at currently prescribed dose and frequency, on 08/19/18.  Recommend concurrent antiplatelet therapy of Aspirin 81mg  daily for 1 month and Clopidogrel 75mg  daily for 6 months.    Initially given Brilinta for quick onset but he had some nausea and vomiting post cath.  IV tirofiban given.  Will give clopidogrel 300 mg x1 followed by 75 mg daily.   Hydration due to baseline renal insufficiency.   Echo 04/2018: Study Conclusions  - Left ventricle: The cavity size was moderately dilated. Wall   thickness was normal. Systolic function was severely reduced. The   estimated ejection fraction was in the range of 20% to 25%.   Diffuse hypokinesis. Features are consistent with a pseudonormal   left ventricular filling pattern, with concomitant abnormal   relaxation and increased filling pressure (grade 2 diastolic   dysfunction). Doppler parameters are consistent with high   ventricular filling pressure. - Aortic valve: There was trivial regurgitation. - Mitral valve: There was mild regurgitation. Valve area by   pressure half-time: 1.09 cm^2. - Left atrium: The atrium was mildly dilated. - Right ventricle: Pacer wire or catheter noted in right ventricle.   Systolic function was reduced.   Patient Profile     84 y.o. 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   Assessment & Plan    1. Acute on chronic combined CHF: patient presented with progressively worsening LE edema and abdominal distention x1 month despite compliance with home diuretics and low sodium diet. BNP elevated to 1382.  CXR with cardiomegaly, probably tiny pleural effusions and pulmonary venous congestion. He was started on a lasix gtt with - this admission. Weight 232lbs  this morning - baseline weight appears to be 223-239lbs. Hypotension and CKD have limited goal directed medical therapy in the past.  - Will continue IV lasix gtt for now - Continue to monitor strict I&Os and daily weights.  - Continue to monitor electrolytes closely and replete to maintain K>4 - Will restart home metoprolol as BP allows - at this point will prioritize diuretics.   2. CAD s/p PCI/DES to LAD 07/2018: no anginal complaints. Not on ASA given need for anticoagulation - Continue plavix - recommended for uninterrupted therapy x6 months - Continue statin  3. S/p ICD: predominantly paced rhythm. May benefit  from device upgrade for BiV pacing. Per Dr. Leonides Sake note, patient was working to transition PPM care to [facility] - unclear what barriers are.  - EP to evaluate today.  4. CKD stage 3: Cr 1.8 this morning. Appears to be about baseline - Cr range 1.5-2.0 over the past several months.  - Continue to monitor closely with IV diuresis   For questions or updates, please contact [facility] Please consult [email or web address] for contact info under Cardiology/STEMI.      Signed, Beatriz Stallion, PA-C  11/24/2018, 8:09 AM   [phone]   Attending Note:   The patient was seen and examined.  Agree with assessment and plan as noted above.  Changes made to the above note as needed.  Patient seen and independently examined with Ryan Pimple, PA .   We discussed all aspects of the encounter. I agree with the assessment and plan as stated above.  Acute on chronic chronic combined systolic and diastolic congestive heart failure: History Ryan Macdonald has had multiple admissions over the past several months for congestive heart failure. He was seen by the EP team today and had his pacemaker reprogrammed.  Hopefully this  will with his episodes of heart failure. He is quite adamant that he stays on a good low-salt diet. We will continue to prescribe standard heart failure medications.  Follow up with dr. Duke Salvia    I have spent a total of 40 minutes with patient reviewing hospital  notes , telemetry, EKGs, labs and examining patient as well as establishing an assessment and plan that was discussed with the patient. > 50% of time was spent in direct patient care.    Vesta Mixer, Montez Hageman., MD, [facility] 11/24/2018, 2:05 PM [address],  [address] Office [phone] Pager [phone]

## 2018-11-24 NOTE — Progress Notes (Signed)
  Echocardiogram 2D Echocardiogram has been performed.  Pieter Partridge 11/24/2018, 3:00 PM

## 2018-11-24 NOTE — H&P (Addendum)
CARDIOLOGY H&P  HPI:  Primary cardiologist Dr. Chilton Siiffany   Patient is an 84 y/o M with known hx of HFrEF with EF of 20-25%, CAD s/p pLAD stent in 07/2018 with residual non-obstructive disease as described below, HLP, CKD stage III, chronic O2 2NC dependence, HLP, pAFib on anticoagulation with eliquis therapy comes to the hospital with increased lower extremity edema and abdominal distension that has been progressively worsening over the last 1 week. He claims to remain complaint with his diet (no-salt) and his diuretic therapy which includes torsemide 40mg , BID with recent intake of lasix 80mg  prn. Despite this, he continue to have progressing worsening of symptoms and hence came to the ER for further evaluation.   He denies any pnd, orthopnea or shortness of breath. He also denies chest pain, dizziness or palpitations. He denies any fevers, chills or recent cold symptoms.   Patient is not on GDMT from a HF standpoint as he becomes hypotensive easily. He is only able to tolerate toprol XL 100mg , qday. Baseline weight per chart review is around 223-239lbs and in the ER he weighed in at 239lbs.  Patient has struggled with recurrent HF exacerbations and has been to the ER with recurrent hospitalizations. There is thoughts of having him being f/u in the heart failure clinic on the most the most recent clinic visit on 11/08/2018.  Review of Systems:     Cardiac Review of Systems: {Y] = yes [ ]  = no  Chest Pain [    ]  Resting SOB [   ] Exertional SOB  [  ]  Orthopnea [  ]   Pedal Edema [ Y ]    Palpitations [  ] Syncope  [  ]   Presyncope [   ]  General Review of Systems: [Y] = yes [  ]=no Constitional: recent weight change [ Y ]; anorexia [  ]; fatigue [  ]; nausea [  ]; night sweats [  ]; fever [  ]; or chills [  ];                                                                     Dental: poor dentition[  ];   Eye : blurred vision [  ]; diplopia [   ]; vision changes [  ];  Amaurosis  fugax[  ]; Resp: cough (chronic) [ Y ];  wheezing[  ];  hemoptysis[  ]; shortness of breath[  ]; paroxysmal nocturnal dyspnea[  ]; dyspnea on exertion[  ]; or orthopnea[  ];  GI:  gallstones[  ], vomiting[  ];  dysphagia[  ]; melena[  ];  hematochezia [  ]; heartburn[  ];   GU: kidney stones [  ]; hematuria[  ];   dysuria [  ];  nocturia[  ];               Skin: rash [  ], swelling[  ];, hair loss[  ];  peripheral edema[  ];  or itching[  ]; Musculosketetal: myalgias[  ];  joint swelling[  ];  joint erythema[  ];  joint pain[  ];  back pain[  ];  Heme/Lymph: bruising[  ];  bleeding[  ];  anemia[  ];  Neuro: TIA[  ];  headaches[  ];  stroke[  ];  vertigo[  ];  seizures[  ];   paresthesias[  ];  difficulty walking[  ];  Psych:depression[  ]; anxiety[  ];  Endocrine: diabetes[Y  ];  thyroid dysfunction[  ];  Other:  Past Medical History:  Diagnosis Date  . Atrial fibrillation (HCC)   . Cardiomyopathy (HCC)   . Cat scratch fever    removed mass on right side neck  . Cataracts, bilateral   . CHF (congestive heart failure) (HCC)   . Chronic kidney disease   . Corns and callosities   . Diabetes mellitus without complication (HCC)    TYPE 2  . Dyspnea   . Erythema intertrigo   . Flat foot   . Gout   . High cholesterol   . Hx of urethral stricture   . Hypertension   . Kidney stones   . Morbid obesity (HCC)   . Nonischemic cardiomyopathy (HCC)    a. ? 2009 Cath in [place] - nl cors per pt;  b. 09/2013 Echo: EF 25-30%, sev diff HK.  . Obesity   . Osteoarthritis   . PAF (paroxysmal atrial fibrillation) (HCC)    a. post-op hip in 2014 - prev on xarelto.  . Renal insufficiency     Torsemide 40mg < BID Lasix 80mg , PRN Toprol XL 100mg , qday ASA 81mg , Plavix 75mg , qday, Amiodarone 200mg  BID  Allergies  Allergen Reactions  . Lisinopril Swelling    Facial swelling  . Xarelto [Rivaroxaban] Other (See Comments)    "Blood came out of my penis"   Social History   Socioeconomic History  .  Marital status: Married    Spouse name: Not on file  . Number of children: Not on file  . Years of education: Not on file  . Highest education level: Not on file  Occupational History  . Occupation: Retired    Comment: Former Patent examiner  . Financial resource strain: Not on file  . Food insecurity:    Worry: Not on file    Inability: Not on file  . Transportation needs:    Medical: Not on file    Non-medical: Not on file  Tobacco Use  . Smoking status: Former Smoker    Packs/day: 1.00    Years: 15.00    Pack years: 15.00    Types: Cigarettes, Cigars  . Smokeless tobacco: Former Neurosurgeon  . Tobacco comment: quit smoking ~ 50 yr ago  Substance and Sexual Activity  . Alcohol use: No    Alcohol/week: 2.0 standard drinks    Types: 2 Cans of beer per week    Comment: rare beer.  . Drug use: No  . Sexual activity: Yes  Lifestyle  . Physical activity:    Days per week: Not on file    Minutes per session: Not on file  . Stress: Not on file  Relationships  . Social connections:    Talks on phone: Not on file    Gets together: Not on file    Attends religious service: Not on file    Active member of club or organization: Not on file    Attends meetings of clubs or organizations: Not on file    Relationship status: Not on file  . Intimate partner violence:    Fear of current or ex partner: No    Emotionally abused: No    Physically abused: No    Forced sexual activity: No  Other Topics Concern  . Not  on file  Social History Narrative   Lives in [place] with wife. Moved from [place] (2013). Does not routinely exercise.   Family History  Problem Relation Age of Onset  . Heart disease Mother   . Heart Problems Maternal Grandmother   . Other Other        negative for premature CAD   PHYSICAL EXAM: Vitals:   11/23/18 2130 11/23/18 2240  BP: 123/67 111/69  Pulse: 67 (!) 53  Resp: 16 20  Temp:  98.6 F (37 C)  SpO2: 100% 97%   General:  Well appearing. No  respiratory difficulty HEENT: normal Neck: supple. Difficult to appreciate given scar from prior.  Cor: PMI nondisplaced. Regular rate and rhythm, no murmurs, bibasilar crackles. Abdomen: soft, nontender, distended. No hepatosplenomegaly. No bruits or masses. Good bowel sounds. Extremities: no cyanosis, clubbing, rash, 3+ b/l pitting edema Neuro: alert & oriented x 3, cranial nerves grossly intact. moves all 4 extremities w/o difficulty. Affect pleasant.  ECG:  Results for orders placed or performed during the hospital encounter of 11/23/18 (from the past 24 hour(s))  CBC with Differential     Status: Abnormal   Collection Time: 11/23/18  3:16 PM  Result Value Ref Range   WBC 5.8 4.0 - 10.5 K/uL   RBC 3.89 (L) 4.22 - 5.81 MIL/uL   Hemoglobin 11.0 (L) 13.0 - 17.0 g/dL   HCT 96.0 (L) 45.4 - 09.8 %   MCV 92.3 80.0 - 100.0 fL   MCH 28.3 26.0 - 34.0 pg   MCHC 30.6 30.0 - 36.0 g/dL   RDW 11.9 (H) 14.7 - 82.9 %   Platelets 236 150 - 400 K/uL   nRBC 0.0 0.0 - 0.2 %   Neutrophils Relative % 65 %   Neutro Abs 3.7 1.7 - 7.7 K/uL   Lymphocytes Relative 22 %   Lymphs Abs 1.3 0.7 - 4.0 K/uL   Monocytes Relative 10 %   Monocytes Absolute 0.6 0.1 - 1.0 K/uL   Eosinophils Relative 2 %   Eosinophils Absolute 0.1 0.0 - 0.5 K/uL   Basophils Relative 1 %   Basophils Absolute 0.0 0.0 - 0.1 K/uL   Immature Granulocytes 0 %   Abs Immature Granulocytes 0.02 0.00 - 0.07 K/uL  Comprehensive metabolic panel     Status: Abnormal   Collection Time: 11/23/18  3:16 PM  Result Value Ref Range   Sodium 139 135 - 145 mmol/L   Potassium 3.7 3.5 - 5.1 mmol/L   Chloride 101 98 - 111 mmol/L   CO2 26 22 - 32 mmol/L   Glucose, Bld 113 (H) 70 - 99 mg/dL   BUN 24 (H) 8 - 23 mg/dL   Creatinine, Ser 5.62 (H) 0.61 - 1.24 mg/dL   Calcium 9.0 8.9 - 13.0 mg/dL   Total Protein 7.3 6.5 - 8.1 g/dL   Albumin 2.8 (L) 3.5 - 5.0 g/dL   AST 30 15 - 41 U/L   ALT 19 0 - 44 U/L   Alkaline Phosphatase 54 38 - 126 U/L   Total  Bilirubin 1.1 0.3 - 1.2 mg/dL   GFR calc non Af Amer 30 (L) >60 mL/min   GFR calc Af Amer 35 (L) >60 mL/min   Anion gap 12 5 - 15  Brain natriuretic peptide     Status: Abnormal   Collection Time: 11/23/18  3:16 PM  Result Value Ref Range   B Natriuretic Peptide 1,382.9 (H) 0.0 - 100.0 pg/mL  Magnesium  Status: None   Collection Time: 11/23/18  3:16 PM  Result Value Ref Range   Magnesium 1.7 1.7 - 2.4 mg/dL  I-stat troponin, ED     Status: None   Collection Time: 11/23/18  3:22 PM  Result Value Ref Range   Troponin i, poc 0.03 0.00 - 0.08 ng/mL   Comment 3          I-Stat CG4 Lactic Acid, ED     Status: Abnormal   Collection Time: 11/23/18  3:24 PM  Result Value Ref Range   Lactic Acid, Venous 2.44 (HH) 0.5 - 1.9 mmol/L   Comment NOTIFIED PHYSICIAN   I-Stat CG4 Lactic Acid, ED     Status: None   Collection Time: 11/23/18  5:43 PM  Result Value Ref Range   Lactic Acid, Venous 1.48 0.5 - 1.9 mmol/L  I-stat troponin, ED     Status: None   Collection Time: 11/23/18  6:33 PM  Result Value Ref Range   Troponin i, poc 0.04 0.00 - 0.08 ng/mL   Comment 3          Urinalysis, Routine w reflex microscopic     Status: Abnormal   Collection Time: 11/23/18  8:35 PM  Result Value Ref Range   Color, Urine YELLOW YELLOW   APPearance HAZY (A) CLEAR   Specific Gravity, Urine 1.008 1.005 - 1.030   pH 6.0 5.0 - 8.0   Glucose, UA NEGATIVE NEGATIVE mg/dL   Hgb urine dipstick LARGE (A) NEGATIVE   Bilirubin Urine NEGATIVE NEGATIVE   Ketones, ur NEGATIVE NEGATIVE mg/dL   Protein, ur NEGATIVE NEGATIVE mg/dL   Nitrite NEGATIVE NEGATIVE   Leukocytes, UA LARGE (A) NEGATIVE   RBC / HPF >50 (H) 0 - 5 RBC/hpf   WBC, UA >50 (H) 0 - 5 WBC/hpf   Bacteria, UA FEW (A) NONE SEEN   Squamous Epithelial / LPF 0-5 0 - 5   Mucus PRESENT    Hyaline Casts, UA PRESENT   Glucose, capillary     Status: Abnormal   Collection Time: 11/23/18 11:10 PM  Result Value Ref Range   Glucose-Capillary 58 (L) 70 - 99  mg/dL   Comment 1 Notify RN    Comment 2 Document in Chart   Glucose, capillary     Status: Abnormal   Collection Time: 11/24/18 12:24 AM  Result Value Ref Range   Glucose-Capillary 149 (H) 70 - 99 mg/dL   Comment 1 Notify RN    Comment 2 Document in Chart    Dg Chest 2 View  Result Date: 11/23/2018 CLINICAL DATA:  Leg swelling.  History of CHF. EXAM: CHEST - 2 VIEW COMPARISON:  October 29, 2018 FINDINGS: Stable cardiomegaly. The hila and mediastinum are unchanged. No pneumothorax. Stable pacemaker. No pulmonary nodules or masses. No focal infiltrates. Probable tiny effusions on the lateral view. Pulmonary venous congestion without overt edema. IMPRESSION: Cardiomegaly, probably tiny pleural effusions, and pulmonary venous congestion. Electronically Signed   By: Gerome Sam III M.D   On: 11/23/2018 16:32   ASSESSMENT:  Acute HF exacerbation. HFrEF most recent echo from 04/2018 EF 20-25% with systolic RV function also reduced. Warm and wet. S/p single lead biotronik ICD implantation in 10/2017 at [facility] CAD s/p recent stent placement to pLAD in 07/2018 with non-obstructive residual disease (dLM 40%, ostial Lcx 50%, mLAD 50%) Chronic O2 dependence at 2L baseline CKD stage III baseline 1.8-2 HLP pAtrial fibrillation Hx of NSVT in the setting of abnormal electrolytes Sleep apnea  testing? Outpatient.  PLAN/DISCUSSION:  Patient given a total of 80mg  of IV lasix and placed on a lasix gtt. Initially he was placed on 10mg /hr, but was increased overnight to 20mg /hr given the poor diuresis. If this still doesn't improve can consider transitioning to bumex as albumin is 2.8 or adding a diuril. Etiology for exacerbation is unclear, however, patient is known to not be compliant with a low salt diet, but he is adamant that he is following it strictly. May benefit from HF evaluation. He may also benefit from a higher HR.  He may benefit from a BiV device given that he appears to be predominantly  paced and the dyssynchrony could be precipitating his HF failure. Device may need to be interrogated in the AM to determine the pacing burden. He is currently unable to tolerate any GDMT other than toprol due to the hypotension which may improve with the BiV device. Not on ACE due to facial swelling.  Renal function is stable and appears to be around his baseline.  Continue the amio and the eliquis (reduced dose for age and renal function) + hx of hematuria while on xarelto.  May benefit from outpatient sleep apnea testing.  Patient must remain on plavix for a minimum of 6 months post stent placement. (March 20th, 2020).  Halina Andreas (Cardiology fellow)

## 2018-11-25 DIAGNOSIS — N183 Chronic kidney disease, stage 3 (moderate): Secondary | ICD-10-CM

## 2018-11-25 DIAGNOSIS — I480 Paroxysmal atrial fibrillation: Secondary | ICD-10-CM

## 2018-11-25 DIAGNOSIS — I2510 Atherosclerotic heart disease of native coronary artery without angina pectoris: Secondary | ICD-10-CM

## 2018-11-25 LAB — GLUCOSE, CAPILLARY
Glucose-Capillary: 91 mg/dL (ref 70–99)
Glucose-Capillary: 118 mg/dL — ABNORMAL HIGH (ref 70–99)
Glucose-Capillary: 103 mg/dL — ABNORMAL HIGH (ref 70–99)
Glucose-Capillary: 143 mg/dL — ABNORMAL HIGH (ref 70–99)

## 2018-11-25 LAB — URINE CULTURE

## 2018-11-25 MED ORDER — FUROSEMIDE 80 MG PO TABS
80.0000 mg | ORAL_TABLET | Freq: Every day | ORAL | Status: DC
  Administered 2018-11-25 – 2018-11-26 (×2): 80 mg via ORAL
  Filled 2018-11-25 (×3): qty 1

## 2018-11-25 NOTE — Progress Notes (Signed)
Progress Note  Patient Name: Ryan Macdonald Date of Encounter: 11/25/2018  Primary Cardiologist: [name], MD ; EP - [facility]  Subjective   Breathing comfortably at [facility] 20 degrees. Additional 1L diuresis. Weight down 5.5 lb since admission. 226.5 b weight is much lower than average weight this year. Reported "dry weight" 223-229 lb. Creat stable at 1.88. Echo yesterday still had restrictive filling.  Inpatient Medications    Scheduled Meds: . amiodarone  200 mg Oral BID  . apixaban  2.5 mg Oral BID  . clopidogrel  75 mg Oral Q breakfast  . finasteride  5 mg Oral Daily  . fluticasone furoate-vilanterol  1 puff Inhalation Daily  . furosemide  80 mg Oral Daily  . insulin aspart  0-15 Units Subcutaneous TID WC  . insulin aspart  0-5 Units Subcutaneous QHS  . rosuvastatin  10 mg Oral q1800  . sertraline  25 mg Oral Daily  . sodium chloride flush  3 mL Intravenous Q12H  . cyanocobalamin  1,000 mcg Oral Daily   Continuous Infusions: . sodium chloride Stopped (11/24/18 0131)   PRN Meds: sodium chloride, allopurinol, benzonatate, guaiFENesin-dextromethorphan, HYDROcodone-acetaminophen, ipratropium-albuterol, nitroGLYCERIN, senna-docusate, sodium chloride flush   Vital Signs    Vitals:   11/24/18 1156 11/24/18 2050 11/25/18 0041 11/25/18 0605  BP: 112/72 121/71 99/62 111/64  Pulse: (!) 55 (!) 59 75 73  Resp: 18 20 18 20   Temp: 98.8 F (37.1 C) 98.6 F (37 C) 97.8 F (36.6 C) 98 F (36.7 C)  TempSrc: Oral Oral Oral Oral  SpO2: 94% 100% 99% 94%  Weight:    102.7 kg  Height:        Intake/Output Summary (Last 24 hours) at 11/25/2018 0815 Last data filed at 11/25/2018 0759 Gross per 24 hour  Intake 603 ml  Output 2600 ml  Net -1997 ml   Filed Weights   11/23/18 2240 11/24/18 0603 11/25/18 0605  Weight: 105.2 kg 105.2 kg 102.7 kg    Telemetry    Sinus rhythm, very rare ventricular paced beats- Personally Reviewed  ECG    No new tracing-  Personally Reviewed  Physical Exam  Comfortable almost flat in bed GEN: No acute distress.   Neck: No JVD Cardiac: RRR, no murmurs, rubs, or gallops.  Respiratory: Clear to auscultation bilaterally. GI: Soft, nontender, non-distended  MS: No edema; No deformity. Neuro:  Nonfocal  Psych: Normal affect   Labs    Chemistry Recent Labs  Lab 11/23/18 1516 11/24/18 0208  NA 139 140  K 3.7 3.7  CL 101 101  CO2 26 26  GLUCOSE 113* 125*  BUN 24* 23  CREATININE 2.01* 1.88*  CALCIUM 9.0 8.8*  PROT 7.3 6.8  ALBUMIN 2.8* 2.6*  AST 30 28  ALT 19 19  ALKPHOS 54 52  BILITOT 1.1 0.8  GFRNONAA 30* 33*  GFRAA 35* 38*  ANIONGAP 12 13     Hematology Recent Labs  Lab 11/23/18 1516 11/24/18 0208  WBC 5.8 5.1  RBC 3.89* 3.49*  HGB 11.0* 10.3*  HCT 35.9* 32.0*  MCV 92.3 91.7  MCH 28.3 29.5  MCHC 30.6 32.2  RDW 17.9* 17.8*  PLT 236 244    Cardiac Enzymes Recent Labs  Lab 11/24/18 0208 11/24/18 0612 11/24/18 1304  TROPONINI 0.03* 0.03* 0.03*    Recent Labs  Lab 11/23/18 1522 11/23/18 1833  TROPIPOC 0.03 0.04     BNP Recent Labs  Lab 11/23/18 1516  BNP 1,382.9*     DDimer No  results for input(s): DDIMER in the last 168 hours.   Radiology    Dg Chest 2 View  Result Date: 11/23/2018 CLINICAL DATA:  Leg swelling.  History of CHF. EXAM: CHEST - 2 VIEW COMPARISON:  October 29, 2018 FINDINGS: Stable cardiomegaly. The hila and mediastinum are unchanged. No pneumothorax. Stable pacemaker. No pulmonary nodules or masses. No focal infiltrates. Probable tiny effusions on the lateral view. Pulmonary venous congestion without overt edema. IMPRESSION: Cardiomegaly, probably tiny pleural effusions, and pulmonary venous congestion. Electronically Signed   By: Gerome Samavid  Williams III M.D   On: 11/23/2018 16:32    Cardiac Studies   11/24/2018 ECHO  - Left ventricle: The cavity size was normal. Wall thickness was   normal. Systolic function was severely reduced. The  estimated   ejection fraction was in the range of 20% to 25%. Diffuse   hypokinesis. Doppler parameters are consistent with a reversible   restrictive pattern, indicative of decreased left ventricular   diastolic compliance and/or increased left atrial pressure (grade   3 diastolic dysfunction). - Aortic valve: Valve mobility was mildly restricted. There was   trivial regurgitation. - Mitral valve: There was moderate regurgitation. - Left atrium: The atrium was mildly to moderately dilated. - Right ventricle: The cavity size was mildly dilated. Wall   thickness was normal. Systolic function was moderately reduced. - Right atrium: The atrium was mildly dilated. Central venous   pressure (est): 10 mm Hg. - Tricuspid valve: There was trivial regurgitation. - Pulmonary arteries: Systolic pressure was mildly increased. PA   peak pressure: 39 mm Hg (S). - Inferior vena cava: The vessel was normal in size. The   respirophasic diameter changes were blunted (< 50%). - Pericardium, extracardiac: There was no pericardial effusion.  Impressions:  - Compared to the prior exam, mitral regurgitation may have   increased slightly. Otherwise,no significant change has occured.   Side by side comparison of images performed.   Patient Profile     84 y.o. 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  HF exacerbation possibly related to increased V pacing after device reprogramming.  Assessment & Plan    1. Acute on chronic combined CHF: gradual onset despite compliance with home diuretics and low sodium diet. Improved, switch to oral diuretics. 2. CAD s/p PCI/DES to LAD 07/2018: on clopidogrel. Not on ASA given need for anticoagulation. Continue statin. No angina. 3. S/p ICD: predominantly  paced rhythm may have contributed to exacerbation. Reprogrammed VDI 50 to enhance native AV conduction and reduce pacing. 4. CKD stage 3: Cr 1.88 at baseline. 5. Parox Afib: Currently maintaining normal rhythm, long AV conduction time but very little ventricular pacing with VVI programming. 6. PPM: Pacemaker had been programmed rate of 70 bpm with high outputs, probably for an MRI that was performed a few months ago and not reprogrammed back to original settings.  Now programmed VDI 50 to allow AV synchrony with as much native AV conduction as possible.    For questions or updates, please contact [facility] Please consult [email or web address] for contact info under        Signed, Thurmon FairMihai Levita Monical, MD  11/25/2018, 8:15 AM

## 2018-11-26 LAB — GLUCOSE, CAPILLARY
Glucose-Capillary: 127 mg/dL — ABNORMAL HIGH (ref 70–99)
Glucose-Capillary: 131 mg/dL — ABNORMAL HIGH (ref 70–99)
Glucose-Capillary: 132 mg/dL — ABNORMAL HIGH (ref 70–99)
Glucose-Capillary: 114 mg/dL — ABNORMAL HIGH (ref 70–99)

## 2018-11-26 MED ORDER — FUROSEMIDE 10 MG/ML IJ SOLN
INTRAMUSCULAR | Status: AC
  Filled 2018-11-26: qty 4

## 2018-11-26 MED ORDER — FUROSEMIDE 10 MG/ML IJ SOLN
40.0000 mg | Freq: Once | INTRAMUSCULAR | Status: AC
  Administered 2018-11-26: 40 mg via INTRAVENOUS
  Filled 2018-11-26: qty 4

## 2018-11-26 NOTE — Progress Notes (Signed)
Progress Note  Patient Name: Ryan Macdonald Date of Encounter: 11/26/2018  Primary Cardiologist: [name], MD   Subjective   Walking around without dyspnea.  In/out matched on oral diuretics today, but still has substantial bilateral pedal edema. Not yet weighed today.  Inpatient Medications    Scheduled Meds: . amiodarone  200 mg Oral BID  . apixaban  2.5 mg Oral BID  . clopidogrel  75 mg Oral Q breakfast  . finasteride  5 mg Oral Daily  . fluticasone furoate-vilanterol  1 puff Inhalation Daily  . furosemide  40 mg Intravenous Once  . furosemide  80 mg Oral Daily  . insulin aspart  0-15 Units Subcutaneous TID WC  . insulin aspart  0-5 Units Subcutaneous QHS  . rosuvastatin  10 mg Oral q1800  . sertraline  25 mg Oral Daily  . sodium chloride flush  3 mL Intravenous Q12H  . cyanocobalamin  1,000 mcg Oral Daily   Continuous Infusions: . sodium chloride Stopped (11/24/18 0131)   PRN Meds: sodium chloride, allopurinol, benzonatate, guaiFENesin-dextromethorphan, HYDROcodone-acetaminophen, ipratropium-albuterol, nitroGLYCERIN, senna-docusate, sodium chloride flush   Vital Signs    Vitals:   11/25/18 1218 11/25/18 1955 11/26/18 0612 11/26/18 0700  BP: (!) 92/57 (!) 98/59 128/76   Pulse: 76 66 84 85  Resp: 18 18 18 18   Temp: (!) 97.4 F (36.3 C) 98.2 F (36.8 C) 99.3 F (37.4 C)   TempSrc: Oral Oral Oral   SpO2: 99% 100% 93% 94%  Weight:      Height:        Intake/Output Summary (Last 24 hours) at 11/26/2018 1054 Last data filed at 11/26/2018 0843 Gross per 24 hour  Intake 896 ml  Output 2000 ml  Net -1104 ml   Filed Weights   11/23/18 2240 11/24/18 0603 11/25/18 0605  Weight: 105.2 kg 105.2 kg 102.7 kg    Telemetry    Sinus rhythm- Personally Reviewed  ECG    No new tracing- Personally Reviewed  Physical Exam  Appears comfortable lying in bed GEN: No acute distress.   Neck: No JVD Cardiac: RRR, no murmurs, rubs, or gallops.    Respiratory: Clear to auscultation bilaterally. GI: Soft, nontender, non-distended  MS:  2 symmetrical pedal edema; No deformity. Neuro:  Nonfocal  Psych: Normal affect   Labs    Chemistry Recent Labs  Lab 11/23/18 1516 11/24/18 0208  NA 139 140  K 3.7 3.7  CL 101 101  CO2 26 26  GLUCOSE 113* 125*  BUN 24* 23  CREATININE 2.01* 1.88*  CALCIUM 9.0 8.8*  PROT 7.3 6.8  ALBUMIN 2.8* 2.6*  AST 30 28  ALT 19 19  ALKPHOS 54 52  BILITOT 1.1 0.8  GFRNONAA 30* 33*  GFRAA 35* 38*  ANIONGAP 12 13     Hematology Recent Labs  Lab 11/23/18 1516 11/24/18 0208  WBC 5.8 5.1  RBC 3.89* 3.49*  HGB 11.0* 10.3*  HCT 35.9* 32.0*  MCV 92.3 91.7  MCH 28.3 29.5  MCHC 30.6 32.2  RDW 17.9* 17.8*  PLT 236 244    Cardiac Enzymes Recent Labs  Lab 11/24/18 0208 11/24/18 0612 11/24/18 1304  TROPONINI 0.03* 0.03* 0.03*    Recent Labs  Lab 11/23/18 1522 11/23/18 1833  TROPIPOC 0.03 0.04     BNP Recent Labs  Lab 11/23/18 1516  BNP 1,382.9*     DDimer No results for input(s): DDIMER in the last 168 hours.   Radiology    No results found.  Cardiac Studies   11/24/2018 ECHO  - Left ventricle: The cavity size was normal. Wall thickness was normal. Systolic function was severely reduced. The estimated ejection fraction was in the range of 20% to 25%. Diffuse hypokinesis. Doppler parameters are consistent with a reversible restrictive pattern, indicative of decreased left ventricular diastolic compliance and/or increased left atrial pressure (grade 3 diastolic dysfunction). - Aortic valve: Valve mobility was mildly restricted. There was trivial regurgitation. - Mitral valve: There was moderate regurgitation. - Left atrium: The atrium was mildly to moderately dilated. - Right ventricle: The cavity size was mildly dilated. Wall thickness was normal. Systolic function was moderately reduced. - Right atrium: The atrium was mildly dilated. Central  venous pressure (est): 10 mm Hg. - Tricuspid valve: There was trivial regurgitation. - Pulmonary arteries: Systolic pressure was mildly increased. PA peak pressure: 39 mm Hg (S). - Inferior vena cava: The vessel was normal in size. The respirophasic diameter changes were blunted (<50%). - Pericardium, extracardiac: There was no pericardial effusion.  Impressions:  - Compared to the prior exam, mitral regurgitation may have increased slightly. Otherwise,no significant change has occured. Side by side comparison of images performed.   Patient Profile     84 y.o. male with a PMH of chronic systolic and diastolic CHFwith EF of 20-25%, CAD s/p pLAD stent in 07/2018 with residual non-obstructive disease, HLP, CKD stage III, COPD with chronic O2 2l [place] dependence, HLP, parox AFib on anticoagulation with eliquis therapy. HF exacerbation possibly related to increased V pacing after device reprogramming.  Assessment & Plan    1. Acute on chronic combined QIO:[id number]:gradual onset despite compliance with home diuretics and low sodium diet. Improved, but still has pedal edema, will give 1 more dose of intravenous diuretics today but anticipate discharge in the morning. 2. CAD s/p PCI/DES to LAD 07/2018:on clopidogrel. Not on ASA given need for anticoagulation. Continue statin. No angina. 3. S/p UXL:[id number]:predominantly paced rhythm may have contributed to exacerbation. Reprogrammed VDI 50 to enhance native AV conduction and reduce pacing.  Very little ventricular pacing seen on telemetry since that programming change. ICD had been programmed rate of 70 bpm with high outputs, probably for an MRI that was performed a few months ago and not reprogrammed back to original settings.   4. CKD stage 3:Cr 1.88 at baseline.  Recheck in the morning. 5. Parox Afib: Currently maintaining normal rhythm, long AV conduction time but very little ventricular pacing with VVI programming.   For questions or updates,  please contact [facility] Please consult [email or web address] for contact info under        Signed, Thurmon FairMihai Kysean Sweet, MD  11/26/2018, 10:54 AM

## 2018-11-27 DIAGNOSIS — I2583 Coronary atherosclerosis due to lipid rich plaque: Secondary | ICD-10-CM

## 2018-11-27 LAB — GLUCOSE, CAPILLARY
Glucose-Capillary: 131 mg/dL — ABNORMAL HIGH (ref 70–99)
Glucose-Capillary: 108 mg/dL — ABNORMAL HIGH (ref 70–99)
Glucose-Capillary: 138 mg/dL — ABNORMAL HIGH (ref 70–99)
Glucose-Capillary: 143 mg/dL — ABNORMAL HIGH (ref 70–99)

## 2018-11-27 LAB — BASIC METABOLIC PANEL
ANION GAP: 11 (ref 5–15)
BUN: 15 mg/dL (ref 8–23)
CALCIUM: 8.5 mg/dL — AB (ref 8.9–10.3)
CO2: 31 mmol/L (ref 22–32)
Chloride: 96 mmol/L — ABNORMAL LOW (ref 98–111)
Creatinine, Ser: 1.42 mg/dL — ABNORMAL HIGH (ref 0.61–1.24)
GFR calc Af Amer: 53 mL/min — ABNORMAL LOW (ref >60)
GFR, EST NON AFRICAN AMERICAN: 46 mL/min — AB (ref >60)
Glucose, Bld: 88 mg/dL (ref 70–99)
Potassium: 3.6 mmol/L (ref 3.5–5.1)
Sodium: 138 mmol/L (ref 135–145)

## 2018-11-27 LAB — MAGNESIUM: MAGNESIUM: 1.8 mg/dL (ref 1.7–2.4)

## 2018-11-27 MED ORDER — TORSEMIDE 20 MG PO TABS: 40.0000 mg | ORAL_TABLET | Freq: Every day | ORAL | Status: DC

## 2018-11-27 MED ORDER — TORSEMIDE 20 MG PO TABS: 40.0000 mg | ORAL_TABLET | Freq: Two times a day (BID) | ORAL | Status: DC

## 2018-11-27 MED ORDER — DIGOXIN 125 MCG PO TABS
0.1250 mg | ORAL_TABLET | Freq: Every day | ORAL | Status: DC
  Administered 2018-11-27 – 2018-11-28 (×2): 0.125 mg via ORAL
  Filled 2018-11-27 (×2): qty 1

## 2018-11-27 MED ORDER — MAGNESIUM SULFATE 2 GM/50ML IV SOLN
2.0000 g | Freq: Once | INTRAVENOUS | Status: AC
  Administered 2018-11-27: 2 g via INTRAVENOUS
  Filled 2018-11-27: qty 50

## 2018-11-27 MED ORDER — POTASSIUM CHLORIDE CRYS ER 20 MEQ PO TBCR
20.0000 meq | EXTENDED_RELEASE_TABLET | Freq: Every day | ORAL | Status: DC
  Administered 2018-11-27 – 2018-11-28 (×2): 20 meq via ORAL
  Filled 2018-11-27 (×2): qty 1

## 2018-11-27 MED ORDER — AMIODARONE HCL 200 MG PO TABS
200.0000 mg | ORAL_TABLET | Freq: Every day | ORAL | Status: DC
  Administered 2018-11-28: 200 mg via ORAL
  Filled 2018-11-27: qty 1

## 2018-11-27 MED ORDER — SPIRONOLACTONE 12.5 MG HALF TABLET
12.5000 mg | ORAL_TABLET | Freq: Every day | ORAL | Status: DC
  Administered 2018-11-27: 12.5 mg via ORAL
  Filled 2018-11-27 (×2): qty 1

## 2018-11-27 MED ORDER — TORSEMIDE 20 MG PO TABS
60.0000 mg | ORAL_TABLET | Freq: Every day | ORAL | Status: DC
  Administered 2018-11-27 – 2018-11-28 (×2): 60 mg via ORAL
  Filled 2018-11-27 (×2): qty 3

## 2018-11-27 NOTE — Consult Note (Addendum)
Advanced Heart Failure Team Consult Note   Primary Physician: [facility] PCP-Cardiologist:  [name], MD  Reason for Consultation: A/C systolic HF  HPI:    Ryan Macdonald is seen today for evaluation of A/C systolic HF at the request of Dr Delton See.   Ryan Macdonald is a 84 y.o. male with a history of systolic HF due to NICM, Biotronic ICD, A Fib on Eliquis, DMII, HTN, CAD s/p pLAD stent in 07/2018 with residual non-obstructive disease, chronic hypoxic respiratory failure on home O2 PRN, and depression. He has had a sleep study in the past and this was negative.   Admitted in March 2019 and HF team was consulted. Diuresed with IV lasix then transitioned to torsemide 40 mg BID. He was started on amiodarone to try to maintain NSR. It does not look like follow up appointment was made for HF. He was followed by HF paramedicine briefly. Pt had concerns about AHF clinic visit being paid for by [facility].   He has been admitted 7 times since that admission with A/C systolic HF. On admission 07/2018, underwent LHC and received DES to LAD. Most recent admission was in November. He was diuresed and then transitioned to torsemide 20 mg daily. DC weight 236 lbs.   He had follow up with Dr Duke Salvia on 11/08/18. He was doing okay. Medications limited with soft BP and CKD. He was only on Toprol at the time in addition to diuretics. He was taking torsemide 40 mg BID + 80 mg lasix PRN. He was referred to Advanced HF clinic at this visit. Weight was 243 lbs.   He says he "does everything he's supposed to". He limits his fluid and salt intake. Takes all medications. He says he does not urinate a lot when he takes torsemide, but does urinate some. He snores at night sometimes. Had a sleep study years ago and it was negative. He has not had any CP. Poor appetite. Eats small meals. Energy level okay. Wears 2 L O2 PRN at home. He did not take any extra lasix or torsemide at home. He has an aide that  helps at home through the [place].  He presented to [facility]18 with progressive SOB and BLE edema x3 days.  Pertinent admission labs include: lactic acid 2.44 > 1.48, mag 1.7, creatinine 2.01, BNP 1382 (prev 1,000-1,400), troponin 0.03 > 0.03, hemoglobin 11.0, WBC 5.8,  CXR with pulmonary venous congestion.  Cardiology was consulted for A/C systolic HF. He was given 80 mg IV lasix and then started on a lasix drip 20 mg/hr. EP was consulted for possible device upgrade. His device was reprogrammed to decrease RV pacing. He is not a CRT candidate with narrow QRS.  He has diuresed 18 lbs, now 222 lbs. He has been transitioned to torsemide 40 mg BID. Creatinine improved with diuresis, now 1.42. Toprol still on hold with SBP 90-100.  Today he feels well. Denies CP or SOB. Denies dizziness.  Echo 11/24/18: EF 20-25%, grade 3 DD, trivial AI, mod MR, LA mild to moderately dilated, RV moderately reduced, RA mildly dilated, PA peak pressure 39 mm Hg  LHC 07/2018:  Mid LM to Dist LM lesion is 40% stenosed. Cross sectional area 6.7 cm2.  Ost Cx lesion is 50% stenosed. Cross sectional area 5.5 cm2.  Mid LAD lesion is 50% stenosed.  LV end diastolic pressure is normal. LVEDP 12 mm Hg.  There is no aortic valve stenosis.  Prox LAD lesion is 75%  stenosed. Severely calcified. Cross sectional area 3.2 and heavily calcified.  A drug-eluting stent was successfully placed using a STENT SYNERGY DES 3X12, postdilated to 3.3 mm.  Post intervention, there is a 0% residual stenosis.   SH: He is a retired Public house manager and retired Nurse, adult. Former smoker >55 years ago. 1 beer every 3 months. No drug use. Gets meds through [place]. No transportation issues.   Review of systems complete and found to be negative unless listed in HPI.   Home Medications Prior to Admission medications   Medication Sig Start Date End Date Taking? Authorizing Provider  allopurinol (ZYLOPRIM) 100 MG tablet Take 200 mg by mouth daily as needed  (for gout symptoms).    Yes [provider]  amiodarone (PACERONE) 200 MG tablet Take 1 tablet (200 mg total) by mouth 2 (two) times daily. Patient taking differently: Take 200 mg by mouth 2 (two) times daily. For heart rhythm control 02/16/18  Yes Santos-Sanchez, Chelsea Primus, MD  apixaban (ELIQUIS) 2.5 MG TABS tablet Take 1 tablet (2.5 mg total) by mouth 2 (two) times daily. Patient taking differently: Take 2.5 mg by mouth 2 (two) times daily. Caution - blood thinner 05/04/18  Yes Winfrey, Kimberlee Nearing, MD  benzonatate (TESSALON) 100 MG capsule Take 1 capsule (100 mg total) by mouth 3 (three) times daily as needed for cough. Patient taking differently: Take 100 mg by mouth every 12 (twelve) hours.  10/19/18  Yes Jodelle Gross, NP  carvedilol (COREG) 12.5 MG tablet Take 12.5 mg by mouth 2 (two) times daily with a meal.   Yes [provider]  chlorpheniramine-HYDROcodone (TUSSIONEX PENNKINETIC ER) 10-8 MG/5ML SUER Take 5 mLs by mouth every 12 (twelve) hours as needed for cough. 09/20/18  Yes [name], MD  clopidogrel (PLAVIX) 75 MG tablet Take 1 tablet (75 mg total) by mouth daily with breakfast. 08/20/18  Yes Laverda Page B, NP  cyanocobalamin 1000 MCG tablet Take 1,000 mcg by mouth daily.    Yes [provider]  diclofenac sodium (VOLTAREN) 1 % GEL Apply 4 g topically 4 (four) times daily. Patient taking differently: Apply 4 g topically 4 (four) times daily as needed (for pain in the back and both knees).  07/10/17  Yes Valentino Nose, MD  docusate sodium (COLACE) 100 MG capsule Take 100 mg by mouth 2 (two) times daily.   Yes [provider]  furosemide (LASIX) 80 MG tablet 1 TABLET BY MOUTH IN THE MORNING AS NEEDED FOR WT GAIN, SHORTNESS OF BREATH, OR INCREASED SWELLING Patient taking differently: Take 80 mg by mouth See admin instructions. 1 TABLET BY MOUTH IN THE MORNING AS NEEDED FOR WT GAIN, SHORTNESS OF BREATH, OR INCREASED SWELLING 11/08/18  Yes  [name], MD  glipiZIDE (GLUCOTROL) 5 MG tablet Take 5 mg by mouth daily before breakfast.   Yes [provider]  guaiFENesin-codeine (GUAIATUSSIN AC) 100-10 MG/5ML syrup Take 5 mLs by mouth 3 (three) times daily as needed for cough.   Yes [provider]  hydrALAZINE (APRESOLINE) 10 MG tablet Take 10 mg by mouth 3 (three) times daily.   Yes [provider]  HYDROcodone-acetaminophen (NORCO/VICODIN) 5-325 MG tablet Take 2 tablets by mouth every 4 (four) hours as needed. Patient taking differently: Take 1 tablet by mouth 2 (two) times daily as needed (for pain).  04/09/16  Yes Isa Rankin, MD  isosorbide dinitrate (ISORDIL) 20 MG tablet Take 40 mg by mouth 3 (three) times daily.   Yes [provider]  magnesium  oxide (MAG-OX) 400 MG tablet Take 400 mg by mouth daily.   Yes [provider]  metoprolol succinate (TOPROL XL) 100 MG 24 hr tablet Take 1 tablet (100 mg total) by mouth daily. Patient taking differently: Take 150 mg by mouth daily.  10/04/18  Yes [name], MD  potassium chloride SA (K-DUR,KLOR-CON) 20 MEQ tablet Take 20 mEq by mouth daily.   Yes [provider]  PRESCRIPTION MEDICATION Apply 1 application topically daily as needed (pain). ThermaGel   Yes [provider]  sertraline (ZOLOFT) 25 MG tablet Take 25 mg by mouth daily.    Yes [provider]  torsemide (DEMADEX) 20 MG tablet Take 40 mg by mouth daily. Take 2 tablets in the morning and 1 in the late afternoon   Yes [provider]  finasteride (PROSCAR) 5 MG tablet Take 5 mg by mouth daily.     [provider]  fluticasone furoate-vilanterol (BREO ELLIPTA) 100-25 MCG/INH AEPB Inhale 1 puff into the lungs daily. 05/05/18   Angelita Ingles, MD  ipratropium-albuterol (DUONEB) 0.5-2.5 (3) MG/3ML SOLN Take 3 mLs by nebulization every 4 (four) hours as needed. Patient taking differently: Take 3 mLs by nebulization every 4 (four)  hours as needed (shortness of breath).  05/14/17   Pearson Grippe, MD  ketoconazole (NIZORAL) 2 % shampoo Apply 1 application topically 2 (two) times a week.    [provider]  nitroGLYCERIN (NITROSTAT) 0.4 MG SL tablet Place 1 tablet (0.4 mg total) under the tongue every 5 (five) minutes x 3 doses as needed for chest pain. 08/19/18   Arty Baumgartner, NP  senna-docusate (SENOKOT S) 8.6-50 MG tablet Take 1 tablet by mouth 2 (two) times daily. 09/10/18   Levora Dredge, MD    Past Medical History: Past Medical History:  Diagnosis Date  . Atrial fibrillation (HCC)   . Cardiomyopathy (HCC)   . Cat scratch fever    removed mass on right side neck  . Cataracts, bilateral   . CHF (congestive heart failure) (HCC)   . Chronic kidney disease   . Corns and callosities   . Diabetes mellitus without complication (HCC)    TYPE 2  . Dyspnea   . Erythema intertrigo   . Flat foot   . Gout   . High cholesterol   . Hx of urethral stricture   . Hypertension   . Kidney stones   . Morbid obesity (HCC)   . Nonischemic cardiomyopathy (HCC)    a. ? 2009 Cath in [place] - nl cors per pt;  b. 09/2013 Echo: EF 25-30%, sev diff HK.  . Obesity   . Osteoarthritis   . PAF (paroxysmal atrial fibrillation) (HCC)    a. post-op hip in 2014 - prev on xarelto.  . Renal insufficiency     Past Surgical History: Past Surgical History:  Procedure Laterality Date  . CARDIAC CATHETERIZATION  1980's  . CIRCUMCISION  1940's  . CORONARY STENT INTERVENTION N/A 08/18/2018   Procedure: CORONARY STENT INTERVENTION;  Surgeon: Corky Crafts, MD;  [facility] INVASIVE CV LAB;  Service: Cardiovascular;  Laterality: N/A;  . CYSTOSCOPY WITH URETHRAL DILATATION  10/23/2013  . CYSTOSCOPY WITH URETHRAL DILATATION N/A 10/23/2013   Procedure: CYSTOSCOPY WITH URETHRAL DILATATION;  Surgeon: Kathi Ludwig, MD;  Location: [facility] OR;  Service: Urology;  Laterality: N/A;  . INCISION AND DRAINAGE OF WOUND Right 1980's    "cat scratch" (10/23/2013)  . INGUINAL HERNIA REPAIR Right 1950's  . INTRAVASCULAR ULTRASOUND/IVUS  N/A 08/18/2018   Procedure: Intravascular Ultrasound/IVUS;  Surgeon: Corky Crafts, MD;  Location: [facility] INVASIVE CV LAB;  Service: Cardiovascular;  Laterality: N/A;  . KIDNEY STONE SURGERY Right 1970's; 1983   "twice"  . LEFT HEART CATH AND CORONARY ANGIOGRAPHY N/A 08/18/2018   Procedure: LEFT HEART CATH AND CORONARY ANGIOGRAPHY;  Surgeon: Corky Crafts, MD;  Location: [facility] INVASIVE CV LAB;  Service: Cardiovascular;  Laterality: N/A;  . TONSILLECTOMY AND ADENOIDECTOMY  1940's  . TOTAL HIP ARTHROPLASTY Left 10/23/2013   Procedure: TOTAL HIP ARTHROPLASTY;  Surgeon: Valeria Batman, MD;  Location: [facility] OR;  Service: Orthopedics;  Laterality: Left;    Family History: Family History  Problem Relation Age of Onset  . Heart disease Mother   . Heart Problems Maternal Grandmother   . Other Other        negative for premature CAD    Social History: Social History   Socioeconomic History  . Marital status: Married    Spouse name: Not on file  . Number of children: Not on file  . Years of education: Not on file  . Highest education level: Not on file  Occupational History  . Occupation: Retired    Comment: Former Patent examiner  . Financial resource strain: Not on file  . Food insecurity:    Worry: Not on file    Inability: Not on file  . Transportation needs:    Medical: Not on file    Non-medical: Not on file  Tobacco Use  . Smoking status: Former Smoker    Packs/day: 1.00    Years: 15.00    Pack years: 15.00    Types: Cigarettes, Cigars  . Smokeless tobacco: Former Neurosurgeon  . Tobacco comment: quit smoking ~ 50 yr ago  Substance and Sexual Activity  . Alcohol use: No    Alcohol/week: 2.0 standard drinks    Types: 2 Cans of beer per week    Comment: rare beer.  . Drug use: No  . Sexual activity: Yes  Lifestyle  . Physical activity:    Days per week: Not on file     Minutes per session: Not on file  . Stress: Not on file  Relationships  . Social connections:    Talks on phone: Not on file    Gets together: Not on file    Attends religious service: Not on file    Active member of club or organization: Not on file    Attends meetings of clubs or organizations: Not on file    Relationship status: Not on file  Other Topics Concern  . Not on file  Social History Narrative   Lives in [place] with wife. Moved from [place] (2013). Does not routinely exercise.    Allergies:  Allergies  Allergen Reactions  . Lisinopril Swelling    Facial swelling  . Xarelto [Rivaroxaban] Other (See Comments)    "Blood came out of my penis"    Objective:    Vital Signs:   Temp:  [98.5 F (36.9 C)-98.8 F (37.1 C)] 98.8 F (37.1 C) (12/30 0628) Pulse Rate:  [62-69] 62 (12/30 0827) Resp:  [18] 18 (12/30 0628) BP: (92-98)/(55-64) 92/55 (12/30 0827) SpO2:  [96 %-100 %] 98 % (12/30 0902) Weight:  [100.9 kg] 100.9 kg (12/30 0635) Last BM Date: 11/27/18  Weight change: Filed Weights   11/25/18 0605 11/26/18 1101 11/27/18 0635  Weight: 102.7 kg 102.2 kg 100.9 kg    Intake/Output:   Intake/Output Summary (  Last 24 hours) at 11/27/2018 1302 Last data filed at 11/27/2018 0800 Gross per 24 hour  Intake 480 ml  Output 1200 ml  Net -720 ml      Physical Exam    General:  Sitting on edge of bed. No resp difficulty HEENT: normal Neck: supple. JVP 6-7. Carotids 2+ bilat; no bruits. No lymphadenopathy or thyromegaly appreciated. Cor: PMI nondisplaced. Regular rate & rhythm. No rubs, gallops or murmurs. Lungs: clear Abdomen: soft, nontender, nondistended. No hepatosplenomegaly. No bruits or masses. Good bowel sounds. Extremities: no cyanosis, clubbing, rash, 1+ edema 1/2 way to knees.  Neuro: alert & orientedx3, cranial nerves grossly intact. moves all 4 extremities w/o difficulty. Affect pleasant   Telemetry   NSR 60-70s with 1st degree AV block.  Personally reviewed.   EKG    EKG 12/26: Vpaced 70. Personally reviewed.   Labs   Basic Metabolic Panel: Recent Labs  Lab 11/23/18 1516 11/24/18 0208 11/27/18 0443  NA 139 140 138  K 3.7 3.7 3.6  CL 101 101 96*  CO2 26 26 31   GLUCOSE 113* 125* 88  BUN 24* 23 15  CREATININE 2.01* 1.88* 1.42*  CALCIUM 9.0 8.8* 8.5*  MG 1.7  --  1.8    Liver Function Tests: Recent Labs  Lab 11/23/18 1516 11/24/18 0208  AST 30 28  ALT 19 19  ALKPHOS 54 52  BILITOT 1.1 0.8  PROT 7.3 6.8  ALBUMIN 2.8* 2.6*   No results for input(s): LIPASE, AMYLASE in the last 168 hours. No results for input(s): AMMONIA in the last 168 hours.  CBC: Recent Labs  Lab 11/23/18 1516 11/24/18 0208  WBC 5.8 5.1  NEUTROABS 3.7 3.4  HGB 11.0* 10.3*  HCT 35.9* 32.0*  MCV 92.3 91.7  PLT 236 244    Cardiac Enzymes: Recent Labs  Lab 11/24/18 0208 11/24/18 0612 11/24/18 1304  TROPONINI 0.03* 0.03* 0.03*    BNP: BNP (last 3 results) Recent Labs    10/19/18 1555 10/29/18 1559 11/23/18 1516  BNP 1,480.9* 1,095.3* 1,382.9*    ProBNP (last 3 results) No results for input(s): PROBNP in the last 8760 hours.   CBG: Recent Labs  Lab 11/26/18 1105 11/26/18 1711 11/26/18 2142 11/27/18 0801 11/27/18 1123  GLUCAP 131* 132* 114* 131* 108*    Coagulation Studies: No results for input(s): LABPROT, INR in the last 72 hours.   Imaging    No results found.   Medications:     Current Medications: . amiodarone  200 mg Oral BID  . apixaban  2.5 mg Oral BID  . clopidogrel  75 mg Oral Q breakfast  . finasteride  5 mg Oral Daily  . fluticasone furoate-vilanterol  1 puff Inhalation Daily  . insulin aspart  0-15 Units Subcutaneous TID WC  . insulin aspart  0-5 Units Subcutaneous QHS  . potassium chloride  20 mEq Oral Daily  . rosuvastatin  10 mg Oral q1800  . sertraline  25 mg Oral Daily  . sodium chloride flush  3 mL Intravenous Q12H  . torsemide  40 mg Oral BID  . cyanocobalamin   1,000 mcg Oral Daily     Infusions: . sodium chloride Stopped (11/24/18 0131)       Patient Profile   Lance CoonFred Bo is a 84 y.o. male with a history of systolic HF due to NICM, Biotronic ICD, A Fib on Eliquis, DMII, HTN, CAD s/p pLAD stent in 07/2018 with residual non-obstructive disease, and depression. He has had a sleep study  in the past and this was negative.   Admitted with A/C systolic HF.   Assessment/Plan   1. A/C Systolic Heart Failure. Due to NICM. EF has been low since at least 2014. - He has single chamber Biotronik ICD. Narrow QRS.  - Echo 11/24/18: EF 20-25%, grade 3 DD, trivial AI, mod MR, LA mild to moderately dilated, RV moderately reduced, RA mildly dilated, PA peak pressure 39 mm Hg - Volume stable on exam. Down 18 lbs from admit.  - Increase torsemide to 60 mg am, 40 mg pm - Toprol on hold with soft BP.  - SBP soft 90-100. Add spiro 12.5 mg qHS.  - No ACEI or ARNI with allergy to lisinopril. Hold off on ARB with CKD. - BP too soft for hydral/imdur.  - Add digoxin 0.125 mg daily. Will have to watch closely with lots of variation in renal function. Creatinine 1.42 today.  - Add TED hose.  - Consider cardiomems. Previously followed by HF paramedicine.   2. CAD - S/p PCI 07/2018 to pLAD - Continue plavix x 6 months (March 2020) + apixiban 2.5 mg BID. - Continue crestor  3. Paroxysmal A fib  - Remains in NSR. He cannot tell when he goes into afib.  - Continue eliquis 2.5 mg twice a day (age >66, creatinine >1.5) - Continue amio 200 mg daily  4. RV pacing  - He had a ICD program change in July and sounds like he has been RV pacing since then.  - Biotronic ICD reprogrammed 12/27 to prevent RV pacing. No further pacing on tele.   5. CKD - Creatinine baseline seems to be 1.6-1.8, but lots of variation - Creatinine 1.42 today.   6. Hypomagnesium  - Mag 1.8 today. Supp.   Medication concerns reviewed with patient and pharmacy team. Barriers identified:  none  Length of Stay: 4  Alford Highland, NP  11/27/2018, 1:02 PM  Advanced Heart Failure Team Pager [phone] (M-F; 7a - 4p)  Please contact [facility] for night-coverage after hours (4p -7a ) and weekends on [email or web address]  Patient seen with NP, agree with the above note.    He was admitted with CHF exacerbation.  I saw him briefly in the past, was supposed to followup in CHF clinic but never came for appt.  Has been admitted 7 more times since I last saw him. Had PCI to LAD in 9/19.  He has been diuresed since admission.  No chest pain.  Creatinine lower at 1.4 today.    He was RV pacing most of this time, device was reprogrammed to limit RV pacing, now does not appear to be RV pacing.   On exam, no JVD.  Regular S1S2, no S3.  1+ ankle edema.  Clear lungs.   1. Acute on chronic systolic CHF: NICM.  He had PCI to LAD recently, but cardiomyopathy has been out of proportion to CAD.  Echo with EF 20-25%, moderately decreased RV systolic function.  Has Biotronik ICD.  He was RV pacing at admission, suspect this may have played a role in CHF exacerbation.  Device reprogrammed, now in NSR and not pacing.  He has diuresed well and volume status improved.  - Will transition to torsemide 60 qam/40 qpm.  - Add spironolactone 12.5 daily.  - Will add back digoxin 0.125 daily, had started him on this in the past, stopped by the [place]. Watch carefully with 1st degree AVB, check ECG in am.  - No ACEI or ARNI with  angioedema to ACEI.  May be able to be on low dose ARB in future.  - Best next step if BP tolerates would be Bidil 1/2 tab tid.  - No beta blocker yet with CHF exacerbation, low BP, and long 1st degree AVB.  - Needs close followup in CHF clinic.  Cardiomems would be reasonable consideration for him.  2. Atrial fibrillation: Paroxysmal.  He is in NSR.   - Continue amiodarone, can decrease to 200 mg daily.  - Continue Eliquis 2.5 mg bid.  3. CAD: Had recent PCI to 75% stenosis proximal LAD (9/19).   No chest pain.  Cardiomyopathy is not explained by CAD.  - Continue Plavix 75 daily until 3/19, continue Eliquis.  - Continue Crestor.  4. CKD: Stage 3.  Creatinine lower at 1.42 today.   [name] 11/27/2018 3:22 PM

## 2018-11-27 NOTE — Progress Notes (Signed)
Progress Note  Patient Name: Ryan Macdonald Date of Encounter: 11/27/2018  Primary Cardiologist: Chilton Siiffany , MD   Subjective   Pt feeling well today. Denies chest pain or SOB. Continues to have LE edema   Inpatient Medications    Scheduled Meds: . amiodarone  200 mg Oral BID  . apixaban  2.5 mg Oral BID  . clopidogrel  75 mg Oral Q breakfast  . finasteride  5 mg Oral Daily  . fluticasone furoate-vilanterol  1 puff Inhalation Daily  . furosemide  80 mg Oral Daily  . insulin aspart  0-15 Units Subcutaneous TID WC  . insulin aspart  0-5 Units Subcutaneous QHS  . rosuvastatin  10 mg Oral q1800  . sertraline  25 mg Oral Daily  . sodium chloride flush  3 mL Intravenous Q12H  . cyanocobalamin  1,000 mcg Oral Daily   Continuous Infusions: . sodium chloride Stopped (11/24/18 0131)   PRN Meds: sodium chloride, allopurinol, benzonatate, guaiFENesin-dextromethorphan, HYDROcodone-acetaminophen, ipratropium-albuterol, nitroGLYCERIN, senna-docusate, sodium chloride flush   Vital Signs    Vitals:   11/27/18 0628 11/27/18 0635 11/27/18 0827 11/27/18 0902  BP: 93/64  (!) 92/55   Pulse: 69  62   Resp: 18     Temp: 98.8 F (37.1 C)     TempSrc: Oral     SpO2: 96%  100% 98%  Weight:  100.9 kg    Height:        Intake/Output Summary (Last 24 hours) at 11/27/2018 1018 Last data filed at 11/27/2018 0800 Gross per 24 hour  Intake 720 ml  Output 1375 ml  Net -655 ml   Filed Weights   11/25/18 0605 11/26/18 1101 11/27/18 0635  Weight: 102.7 kg 102.2 kg 100.9 kg    Physical Exam   General: Well developed, well nourished, NAD Skin: Warm, dry, intact  Head: Normocephalic, atraumatic, clear, moist mucus membranes. Neck: Negative for carotid bruits. No JVD Lungs:Clear to ausculation bilaterally. No wheezes, rales, or rhonchi. Breathing is unlabored. Cardiovascular: RRR with S1 S2. No murmurs, rubs, gallops, or LV heave appreciated. Abdomen: Soft, non-tender,  non-distended with normoactive bowel sounds. No obvious abdominal masses. MSK: Strength and tone appear normal for age. 5/5 in all extremities Extremities: 1-2+ BLE edema. No clubbing or cyanosis. DP/PT pulses 2+ bilaterally Neuro: Alert and oriented. No focal deficits. No facial asymmetry. MAE spontaneously. Psych: Responds to questions appropriately with normal affect.    Labs    Chemistry Recent Labs  Lab 11/23/18 1516 11/24/18 0208 11/27/18 0443  NA 139 140 138  K 3.7 3.7 3.6  CL 101 101 96*  CO2 26 26 31   GLUCOSE 113* 125* 88  BUN 24* 23 15  CREATININE 2.01* 1.88* 1.42*  CALCIUM 9.0 8.8* 8.5*  PROT 7.3 6.8  --   ALBUMIN 2.8* 2.6*  --   AST 30 28  --   ALT 19 19  --   ALKPHOS 54 52  --   BILITOT 1.1 0.8  --   GFRNONAA 30* 33* 46*  GFRAA 35* 38* 53*  ANIONGAP 12 13 11      Hematology Recent Labs  Lab 11/23/18 1516 11/24/18 0208  WBC 5.8 5.1  RBC 3.89* 3.49*  HGB 11.0* 10.3*  HCT 35.9* 32.0*  MCV 92.3 91.7  MCH 28.3 29.5  MCHC 30.6 32.2  RDW 17.9* 17.8*  PLT 236 244    Cardiac Enzymes Recent Labs  Lab 11/24/18 0208 11/24/18 0612 11/24/18 1304  TROPONINI 0.03* 0.03* 0.03*  Recent Labs  Lab 11/23/18 1522 11/23/18 1833  TROPIPOC 0.03 0.04     BNP Recent Labs  Lab 11/23/18 1516  BNP 1,382.9*     DDimer No results for input(s): DDIMER in the last 168 hours.   Radiology    No results found.  Telemetry    11/27/18 NSR with 1st degree AV block- Personally Reviewed  ECG    No new tracings as of 11/27/2018- Personally Reviewed  Cardiac Studies   Echocardiogram 11/24/2018:  - Left ventricle: The cavity size was normal. Wall thickness was normal. Systolic function was severely reduced. The estimated ejection fraction was in the range of 20% to 25%. Diffuse hypokinesis. Doppler parameters are consistent with a reversible restrictive pattern, indicative of decreased left ventricular diastolic compliance and/or increased  left atrial pressure (grade 3 diastolic dysfunction). - Aortic valve: Valve mobility was mildly restricted. There was trivial regurgitation. - Mitral valve: There was moderate regurgitation. - Left atrium: The atrium was mildly to moderately dilated. - Right ventricle: The cavity size was mildly dilated. Wall thickness was normal. Systolic function was moderately reduced. - Right atrium: The atrium was mildly dilated. Central venous pressure (est): 10 mm Hg. - Tricuspid valve: There was trivial regurgitation. - Pulmonary arteries: Systolic pressure was mildly increased. PA peak pressure: 39 mm Hg (S). - Inferior vena cava: The vessel was normal in size. The respirophasic diameter changes were blunted (<50%). - Pericardium, extracardiac: There was no pericardial effusion.  Impressions:  - Compared to the prior exam, mitral regurgitation may have increased slightly. Otherwise,no significant change has occured. Side by side comparison of images performed.  Patient Profile     84 y.o. male with a PMH of chronicsystolic and diastolicCHFwith EF of 20-25%, CAD s/p pLAD stent in 07/2018 with residual non-obstructive disease, HLP, CKD stage III,COPD withchronic O2 2Ldependence, HLP, paroxAFib on anticoagulation with eliquis therapy who presented with  HF exacerbation possibly related to increased V pacing after device reprogramming.  Assessment & Plan    1. Acute on chronic combined CHF: -Patient with known systolic and diastolic dysfunction with an LVEF of 20 to 25% who presented with CHF exacerbation despite compliance with home diuretics and low-sodium diet. -Per chart review, pedal edema has improved however continues to have substantial bilateral pedal edema  -Weight, 222lb today, down from 225lb yesterday and with a presenting weight of 240lb -I&O, net -4.4 L since hospital admission -Creatinine, 1.42 today which appears to be below his baseline of  1.5-1.7 -Currently on Lasix 80 mg PO,   -Toprol-XL 100mg  on hold secondary to mild hypotension  -On home Demadex 20mg  (40mg  AM and 20 in PM) -Consider restarting home HF regimen including hydralazine 10, isordil 20, and Toprol once BP more stable    2. CAD s/p PCI/DES to LAD 07/2018: -Denies anginal symptoms -Continue Plavix, no ASA given need for anticoagulation secondary to paroxysmal atrial fibrillation -Continue statin  3. S/p ICD: -Reprogrammed VDI 50 to enhance native AV conduction and reduce pacing.  Very little ventricular pacing seen on telemetry since that programming change. ICD had been programmed rate of 70 bpm with high outputs, probably for an MRI that was performed a few months ago and not reprogrammed back to original settings.   4. CKD stage III: -Creatinine, 1.42 today which appears to be below his baseline of 1.5-1.7  5. Parox Afib: -Maintaining NSR with minimal V-pacing per tele review  -Continue Amio 200mg  twice daily, Eliquis 2.5mg   -See #3  6. Hypotension: -Mild hypotension,  92/55>93/64>98/62 -Hold antihypertensives   Signed, Georgie Chard NP-C [facility] Pager: [phone] 11/27/2018, 10:18 AM     For questions or updates, please contact   Please consult [email or web address] for contact info under Cardiology/STEMI.

## 2018-11-27 NOTE — Care Management Note (Signed)
Case Management Note  Patient Details  Name: Ryan Macdonald MRN: [id number] Date of Birth: June 19, 1934  Subjective/Objective:     CHF              Action/Plan: 11/27/2018- Patient is very well known to me from previous admission; Noted admitted 7 times in 6 months; CM will continue to follow for progression of care; [name] [phone]  Action/Plan: 09/25/2018 - Patient well known to me from previous admission; Noted, admitted 6 times in 6 months; [facility]; CM will continue to follow for progression of care.[name] with the [place]  [phone] ext [phone]) following pt for disposition needs; [name] [phone]  05/02/2018 [name] at home with his spouse; goes to the [facility] for medical Care. Patient [facility] to the [facility] for primary care and for his medication. [phone]- follow the prompts #1 [facility], Dr Harrie Jeans # [phone]. Patient stated that if his prescriptions are sent today, they will send him the medication the next day; Attending MD, please put the patient last 4 numbers of his social security number on the prescription for the [facility] to identify the patient; ( [id number]).[name] [facility] [phone]  Expected Discharge Date:    Possibly 11/30/2018              Expected Discharge Plan:   Home  Status of Service:   In progress  [name] [phone] 11/27/2018, 1:44 PM

## 2018-11-27 NOTE — Care Management Important Message (Signed)
Important Message  Patient Details  Name: Ryan Macdonald MRN: [id number] Date of Birth: May 18, 1934   Medicare Important Message Given:  Yes    Mekai Wilkinson P Dian Minahan 11/27/2018, 3:24 PM

## 2018-11-28 LAB — BASIC METABOLIC PANEL
ANION GAP: 11 (ref 5–15)
BUN: 14 mg/dL (ref 8–23)
CALCIUM: 8.6 mg/dL — AB (ref 8.9–10.3)
CO2: 32 mmol/L (ref 22–32)
Chloride: 95 mmol/L — ABNORMAL LOW (ref 98–111)
Creatinine, Ser: 1.46 mg/dL — ABNORMAL HIGH (ref 0.61–1.24)
GFR calc non Af Amer: 44 mL/min — ABNORMAL LOW (ref >60)
GFR, EST AFRICAN AMERICAN: 51 mL/min — AB (ref >60)
Glucose, Bld: 101 mg/dL — ABNORMAL HIGH (ref 70–99)
Potassium: 4.2 mmol/L (ref 3.5–5.1)
Sodium: 138 mmol/L (ref 135–145)

## 2018-11-28 LAB — GLUCOSE, CAPILLARY
Glucose-Capillary: 82 mg/dL (ref 70–99)
Glucose-Capillary: 163 mg/dL — ABNORMAL HIGH (ref 70–99)

## 2018-11-28 LAB — MAGNESIUM: Magnesium: 2.1 mg/dL (ref 1.7–2.4)

## 2018-11-28 MED ORDER — HYDRALAZINE HCL 25 MG PO TABS: 12.5000 mg | ORAL_TABLET | Freq: Three times a day (TID) | ORAL | 5 refills | Status: DC

## 2018-11-28 MED ORDER — DIGOXIN 62.5 MCG PO TABS: 0.0625 mg | ORAL_TABLET | Freq: Every day | ORAL | 5 refills | Status: DC

## 2018-11-28 MED ORDER — ROSUVASTATIN CALCIUM 10 MG PO TABS: 10.0000 mg | ORAL_TABLET | Freq: Every day | ORAL | 5 refills | Status: DC

## 2018-11-28 MED ORDER — FINASTERIDE 5 MG PO TABS: 5.0000 mg | ORAL_TABLET | Freq: Every day | ORAL | 0 refills | Status: DC

## 2018-11-28 MED ORDER — DIGOXIN 125 MCG PO TABS: 0.0625 mg | ORAL_TABLET | Freq: Every day | ORAL | Status: DC

## 2018-11-28 MED ORDER — ISOSORB DINITRATE-HYDRALAZINE 20-37.5 MG PO TABS
0.5000 | ORAL_TABLET | Freq: Three times a day (TID) | ORAL | Status: DC
  Administered 2018-11-28: 0.5 via ORAL
  Filled 2018-11-28: qty 1

## 2018-11-28 MED ORDER — TORSEMIDE 20 MG PO TABS: ORAL_TABLET | ORAL | 5 refills | Status: DC

## 2018-11-28 MED ORDER — ISOSORBIDE MONONITRATE ER 30 MG PO TB24: 15.0000 mg | ORAL_TABLET | Freq: Every day | ORAL | Status: DC

## 2018-11-28 MED ORDER — ISOSORBIDE MONONITRATE ER 30 MG PO TB24: 15.0000 mg | ORAL_TABLET | Freq: Every day | ORAL | 5 refills | Status: DC

## 2018-11-28 MED ORDER — AMIODARONE HCL 200 MG PO TABS: 200.0000 mg | ORAL_TABLET | Freq: Every day | ORAL | 5 refills | Status: DC

## 2018-11-28 MED ORDER — HYDRALAZINE HCL 25 MG PO TABS
12.5000 mg | ORAL_TABLET | Freq: Three times a day (TID) | ORAL | Status: DC
  Administered 2018-11-28: 12.5 mg via ORAL
  Filled 2018-11-28: qty 1

## 2018-11-28 MED ORDER — SPIRONOLACTONE 25 MG PO TABS: 12.5000 mg | ORAL_TABLET | Freq: Every day | ORAL | 5 refills | Status: DC

## 2018-11-28 NOTE — Progress Notes (Signed)
Patient for discharge home today; Prescriptions faxed as requested to the [facility] fax # [phone]; Pharmacist at the Clinic called and they are aware of prescriptions ( tele # [phone] ext [phone]; [facility] office called ( tele # [phone] ext [phone]) - they are aware of the admission and discharge home; [name] [phone]

## 2018-11-28 NOTE — Discharge Summary (Signed)
Advanced Heart Failure Discharge Note  Discharge Summary   Patient ID: Ryan Macdonald MRN: [id number], DOB/AGE: 1934-11-03 84 y.o. Admit date: 11/23/2018 D/C date:     11/28/2018   Primary Discharge Diagnoses:  1. A/C systolic HF 2. CAD 3. PAF 4. RV pacing 5. CKD 6. Hypomagnesium  Hospital Course:  Ryan Macdonald is a 84 y.o. male with a history of systolic HF due to NICM, Biotronic ICD, A Fib on Eliquis, DMII, HTN, CAD s/p pLAD stent in 07/2018 with residual non-obstructive disease, chronic hypoxic respiratory failure on home O2 PRN, and depression.  He has frequent admission and ED visits for A/C systolic HF. He presented with volume overload and poor UOP. He diuresed 21 lbs with lasix drip and transitioned to torsemide 60 mg am/40 mg pm (increased from 40 mg BID). Echo showed EF 20-25%. EP was consulted for RV pacing and ICD settings were adjusted. Advanced HF team consulted. He was started on spiro, digoxin, hydral, and imdur. No ARB added with CKD.   1. A/C Systolic Heart Failure. Due to NICM. EF has been low since at least 2014. -He has single chamber Biotronik ICD. Narrow QRS.Settings adjusted to prevent RV pacing. -Echo12/27/19: EF 20-25%, grade 3 DD, trivial AI, mod MR, LA mild to moderately dilated, RV moderately reduced, RA mildly dilated, PA peak pressure 39 mm Hg -Continuetorsemide 60 mg am, 40 mg pm (increased from torsemide 40 mg BID) -Continue spiro 12.5 mg qHS.  - Continue hydral 12.5 mg TID + imdur 15 mg daily. Bidil not covered by [facility]. -Continue digoxin 0.0625 mg daily.  -Hold BB with low BP and long PR interval -No ACEI or ARNI with allergy to lisinopril.Hold off on ARB with CKD. - Consider cardiomems.  2. CAD - S/p PCI 07/2018 to pLAD - Continue plavix x 6 months(until March 2020)+ apixiban 2.5 mg BID. - Continue crestor - No s/s ischemia.  3.ParoxysmalA fib - Remained in NSR.  -Continue eliquis2.5mg  twice a day (age >40, creatinine >1.5  generally). If creatinine remains <1.5, could consider increasing Eliquis to 5 mg BID - Continue amio 200 mg daily  4. RV pacing - He had an ICD program change in July and sounds like he has been RV pacing since then. - Biotronic ICD reprogrammed12/27 by EPto prevent RV pacing and he had no further pacing.  5. CKD - Creatinine baseline seems to be 1.6-1.8, but lots of variation - Renal function monitored closely. Creatinine 1.46 on day of discharge.  6. Hypomagnesium  - Resolved with supplementation.   He will be followed closely by the HF team, with appointment as below. He will need a BMET and EKG at follow up. Prescriptions were faxed to [facility] and requested for overnight mail order.   Discharge Weight Range: 220 lbs Discharge Vitals: Blood pressure (!) 96/54, pulse 71, temperature 98.5 F (36.9 C), temperature source Oral, resp. rate 18, height 5\' 7"  (1.702 m), weight 99.7 kg, SpO2 95 %.  Labs: Lab Results  Component Value Date   WBC 5.1 11/24/2018   HGB 10.3 (L) 11/24/2018   HCT 32.0 (L) 11/24/2018   MCV 91.7 11/24/2018   PLT 244 11/24/2018    Recent Labs  Lab 11/24/18 0208  11/28/18 0451  NA 140   < > 138  K 3.7   < > 4.2  CL 101   < > 95*  CO2 26   < > 32  BUN 23   < > 14  CREATININE 1.88*   < >  1.46*  CALCIUM 8.8*   < > 8.6*  PROT 6.8  --   --   BILITOT 0.8  --   --   ALKPHOS 52  --   --   ALT 19  --   --   AST 28  --   --   GLUCOSE 125*   < > 101*   < > = values in this interval not displayed.   Lab Results  Component Value Date   CHOL 159 08/15/2018   HDL 32 (L) 08/15/2018   LDLCALC 109 (H) 08/15/2018   TRIG 89 08/15/2018   BNP (last 3 results) Recent Labs    10/19/18 1555 10/29/18 1559 11/23/18 1516  BNP 1,480.9* 1,095.3* 1,382.9*    ProBNP (last 3 results) No results for input(s): PROBNP in the last 8760 hours.   Diagnostic Studies/Procedures   Echo 11/24/18: EF 20-25%, grade 3 DD, trivial AI, mod MR, LA mild to moderately dilated,  RV moderately reduced, RA mildly dilated, PA peak pressure 39 mm Hg  Discharge Medications   Allergies as of 11/28/2018      Reactions   Lisinopril Swelling   Facial swelling   Xarelto [rivaroxaban] Other (See Comments)   "Blood came out of my penis"      Medication List    STOP taking these medications   carvedilol 12.5 MG tablet Commonly known as:  COREG   chlorpheniramine-HYDROcodone 10-8 MG/5ML Suer Commonly known as:  TUSSIONEX PENNKINETIC ER   furosemide 80 MG tablet Commonly known as:  LASIX   GUAIATUSSIN AC 100-10 MG/5ML syrup Generic drug:  guaiFENesin-codeine   isosorbide dinitrate 20 MG tablet Commonly known as:  ISORDIL   metoprolol succinate 100 MG 24 hr tablet Commonly known as:  TOPROL XL   PRESCRIPTION MEDICATION     TAKE these medications   allopurinol 100 MG tablet Commonly known as:  ZYLOPRIM Take 200 mg by mouth daily as needed (for gout symptoms).   amiodarone 200 MG tablet Commonly known as:  PACERONE Take 1 tablet (200 mg total) by mouth daily. What changed:  when to take this   apixaban 2.5 MG Tabs tablet Commonly known as:  ELIQUIS Take 1 tablet (2.5 mg total) by mouth 2 (two) times daily. What changed:  additional instructions   benzonatate 100 MG capsule Commonly known as:  TESSALON Take 1 capsule (100 mg total) by mouth 3 (three) times daily as needed for cough. What changed:  when to take this   clopidogrel 75 MG tablet Commonly known as:  PLAVIX Take 1 tablet (75 mg total) by mouth daily with breakfast.   cyanocobalamin 1000 MCG tablet Take 1,000 mcg by mouth daily.   diclofenac sodium 1 % Gel Commonly known as:  VOLTAREN Apply 4 g topically 4 (four) times daily. What changed:    when to take this  reasons to take this   Digoxin 62.5 MCG Tabs Take 0.0625 mg by mouth daily. Start taking on:  November 29, 2018   docusate sodium 100 MG capsule Commonly known as:  COLACE Take 100 mg by mouth 2 (two) times daily.     finasteride 5 MG tablet Commonly known as:  PROSCAR Take 1 tablet (5 mg total) by mouth daily. Start taking on:  November 29, 2018   fluticasone furoate-vilanterol 100-25 MCG/INH Aepb Commonly known as:  BREO ELLIPTA Inhale 1 puff into the lungs daily.   glipiZIDE 5 MG tablet Commonly known as:  GLUCOTROL Take 5 mg by mouth daily before  breakfast.   hydrALAZINE 25 MG tablet Commonly known as:  APRESOLINE Take 0.5 tablets (12.5 mg total) by mouth every 8 (eight) hours. What changed:    medication strength  how much to take  when to take this   HYDROcodone-acetaminophen 5-325 MG tablet Commonly known as:  NORCO/VICODIN Take 2 tablets by mouth every 4 (four) hours as needed. What changed:    how much to take  when to take this  reasons to take this   ipratropium-albuterol 0.5-2.5 (3) MG/3ML Soln Commonly known as:  DUONEB Take 3 mLs by nebulization every 4 (four) hours as needed. What changed:  reasons to take this   isosorbide mononitrate 30 MG 24 hr tablet Commonly known as:  IMDUR Take 0.5 tablets (15 mg total) by mouth daily. Start taking on:  November 29, 2018   ketoconazole 2 % shampoo Commonly known as:  NIZORAL Apply 1 application topically 2 (two) times a week.   magnesium oxide 400 MG tablet Commonly known as:  MAG-OX Take 400 mg by mouth daily.   nitroGLYCERIN 0.4 MG SL tablet Commonly known as:  NITROSTAT Place 1 tablet (0.4 mg total) under the tongue every 5 (five) minutes x 3 doses as needed for chest pain.   potassium chloride SA 20 MEQ tablet Commonly known as:  K-DUR,KLOR-CON Take 20 mEq by mouth daily.   rosuvastatin 10 MG tablet Commonly known as:  CRESTOR Take 1 tablet (10 mg total) by mouth daily at 6 PM.   senna-docusate 8.6-50 MG tablet Commonly known as:  SENOKOT S Take 1 tablet by mouth 2 (two) times daily. What changed:  when to take this   sertraline 25 MG tablet Commonly known as:  ZOLOFT Take 25 mg by mouth daily.    spironolactone 25 MG tablet Commonly known as:  ALDACTONE Take 0.5 tablets (12.5 mg total) by mouth at bedtime.   torsemide 20 MG tablet Commonly known as:  DEMADEX Take 60 mg (3 tabs) in am and 40 mg (2 tabs) in pm What changed:    how much to take  how to take this  when to take this  additional instructions       Disposition   The patient will be discharged in stable condition to home. Discharge Instructions    (HEART FAILURE PATIENTS) Call MD:  Anytime you have any of the following symptoms: 1) 3 pound weight gain in 24 hours or 5 pounds in 1 week 2) shortness of breath, with or without a dry hacking cough 3) swelling in the hands, feet or stomach 4) if you have to sleep on extra pillows at night in order to breathe.   Complete by:  As directed    Call MD for:  difficulty breathing, headache or visual disturbances   Complete by:  As directed    Call MD for:  persistant dizziness or light-headedness   Complete by:  As directed    Diet - low sodium heart healthy   Complete by:  As directed    Heart Failure patients record your daily weight using the same scale at the same time of day   Complete by:  As directed    Increase activity slowly   Complete by:  As directed    STOP any activity that causes chest pain, shortness of breath, dizziness, sweating, or exessive weakness   Complete by:  As directed      Follow-up Information    [facility]. Go  on 12/12/2018.   Specialty:  Cardiology Why:  at 2 pm in the Advanced Heart Failure Clinic. Please bring all medications to appt.  Gate code is [phone] for January. Contact information: [address] [id number] [place] [id number] [phone]            Duration of Discharge Encounter: Greater than 35 minutes   Signed, Alford HighlandAshley M Smith, NP 11/28/2018, 1:28 PM

## 2018-11-28 NOTE — Progress Notes (Addendum)
Advanced Heart Failure Rounding Note  PCP-Cardiologist: [name], MD   Subjective:    Transitioned to torsemide yesterday. Received 60 mg x1 (am dose held with low BP). Weight down 3 more lbs. Creatinine stable 1.46.   He was also started on spiro and digoxin. SBP 100s.  Denies CP, SOB, orthopnea, or dizziness.   Objective:   Weight Range: 99.7 kg Body mass index is 34.44 kg/m.   Vital Signs:   Temp:  [97.7 F (36.5 C)-98.6 F (37 C)] 98.6 F (37 C) (12/31 0326) Pulse Rate:  [62-75] 70 (12/31 0326) Resp:  [16-18] 18 (12/31 0326) BP: (92-106)/(55-64) 106/64 (12/31 0326) SpO2:  [95 %-100 %] 95 % (12/31 0326) Weight:  [99.7 kg] 99.7 kg (12/31 0326) Last BM Date: 11/26/18  Weight change: Filed Weights   11/26/18 1101 11/27/18 0635 11/28/18 0326  Weight: 102.2 kg 100.9 kg 99.7 kg    Intake/Output:   Intake/Output Summary (Last 24 hours) at 11/28/2018 0733 Last data filed at 11/28/2018 0328 Gross per 24 hour  Intake 890 ml  Output 2475 ml  Net -1585 ml      Physical Exam    General:  Well appearing. No resp difficulty HEENT: Normal Neck: Supple. JVP flat. Carotids 2+ bilat; no bruits. No lymphadenopathy or thyromegaly appreciated. Cor: PMI nondisplaced. Regular rate & rhythm. No rubs, gallops or murmurs. Lungs: Clear Abdomen: Soft, nontender, nondistended. No hepatosplenomegaly. No bruits or masses. Good bowel sounds. Extremities: No cyanosis, clubbing, rash, BLE no edema. BLE TED hose on.  Neuro: Alert & orientedx3, cranial nerves grossly intact. moves all 4 extremities w/o difficulty. Affect pleasant   Telemetry   NSR 60-70s with 1st degree AV block. Personally reviewed.   EKG    EKG 12/31: NSR 71 bpm, 1st degree AV block (296 ms), incomplete LBBB (116 ms), prolonged QTc (480 ms). Personally reviewed.  Labs    CBC No results for input(s): WBC, NEUTROABS, HGB, HCT, MCV, PLT in the last 72 hours. Basic Metabolic Panel Recent Labs   11/27/18 0443 11/28/18 0451  NA 138 138  K 3.6 4.2  CL 96* 95*  CO2 31 32  GLUCOSE 88 101*  BUN 15 14  CREATININE 1.42* 1.46*  CALCIUM 8.5* 8.6*  MG 1.8  --    Liver Function Tests No results for input(s): AST, ALT, ALKPHOS, BILITOT, PROT, ALBUMIN in the last 72 hours. No results for input(s): LIPASE, AMYLASE in the last 72 hours. Cardiac Enzymes No results for input(s): CKTOTAL, CKMB, CKMBINDEX, TROPONINI in the last 72 hours.  BNP: BNP (last 3 results) Recent Labs    10/19/18 1555 10/29/18 1559 11/23/18 1516  BNP 1,480.9* 1,095.3* 1,382.9*    ProBNP (last 3 results) No results for input(s): PROBNP in the last 8760 hours.   D-Dimer No results for input(s): DDIMER in the last 72 hours. Hemoglobin A1C No results for input(s): HGBA1C in the last 72 hours. Fasting Lipid Panel No results for input(s): CHOL, HDL, LDLCALC, TRIG, CHOLHDL, LDLDIRECT in the last 72 hours. Thyroid Function Tests No results for input(s): TSH, T4TOTAL, T3FREE, THYROIDAB in the last 72 hours.  Invalid input(s): FREET3  Other results:   Imaging     No results found.   Medications:     Scheduled Medications: . amiodarone  200 mg Oral Daily  . apixaban  2.5 mg Oral BID  . clopidogrel  75 mg Oral Q breakfast  . digoxin  0.125 mg Oral Daily  . finasteride  5 mg Oral  Daily  . fluticasone furoate-vilanterol  1 puff Inhalation Daily  . insulin aspart  0-15 Units Subcutaneous TID WC  . insulin aspart  0-5 Units Subcutaneous QHS  . potassium chloride  20 mEq Oral Daily  . rosuvastatin  10 mg Oral q1800  . sertraline  25 mg Oral Daily  . sodium chloride flush  3 mL Intravenous Q12H  . spironolactone  12.5 mg Oral QHS  . torsemide  40 mg Oral Daily  . torsemide  60 mg Oral Daily  . cyanocobalamin  1,000 mcg Oral Daily     Infusions: . sodium chloride Stopped (11/24/18 0131)     PRN Medications:  sodium chloride, allopurinol, benzonatate, guaiFENesin-dextromethorphan,  HYDROcodone-acetaminophen, ipratropium-albuterol, nitroGLYCERIN, senna-docusate, sodium chloride flush    Patient Profile   Ryan Macdonald is a 84 y.o. male with a history of systolic HF due to NICM, Biotronic ICD, A Fib on Eliquis, DMII, HTN, CAD s/p pLAD stent in 07/2018 with residual non-obstructive disease, chronic hypoxic respiratory failure on home O2 PRN, and depression.  Admitted with A/C systolic HF.   Assessment/Plan   1. A/C Systolic Heart Failure. Due to NICM. EF has been low since at least 2014. - He has single chamber Biotronik ICD. Narrow QRS.  - Echo 11/24/18: EF 20-25%, grade 3 DD, trivial AI, mod MR, LA mild to moderately dilated, RV moderately reduced, RA mildly dilated, PA peak pressure 39 mm Hg - Volume stable on exam. Down 21 lbs from admit.  - Continue torsemide 60 mg am, 40 mg pm - Hold BB with low BP and long PR interval  - Continue spiro 12.5 mg qHS.  - No ACEI or ARNI with allergy to lisinopril. Hold off on ARB with CKD. - SBP 100s. Add bidil 1/2 tab TID. Hold for SBP <90. Will have CM check price.  - Continue digoxin 0.125 mg daily.  - Continue TED hose.  - Consider cardiomems. Previously followed by HF paramedicine.   2. CAD - S/p PCI 07/2018 to pLAD - Continue plavix x 6 months (until March 2020) + apixiban 2.5 mg BID. - Continue crestor - No s/s ischemia.  3. ParoxysmalA fib  - Remains in NSR.  - Continue eliquis 2.5 mg twice a day (age >49, creatinine >1.5) - Continue amio 200 mg daily  4. RV pacing  - He had a ICD program change in July and sounds like he has been RV pacing since then.  - Biotronic ICD reprogrammed 12/27 to prevent RV pacing. No further pacing on tele.   5. CKD - Creatinine baseline seems to be 1.6-1.8, but lots of variation - Creatinine stable 1.46 today  6. Hypomagnesium  - Add on mag to am labs.   Medication concerns reviewed with patient and pharmacy team. Barriers identified: none  I think he can probably  go home today. He has follow up in HF clinic 1/14. [facility].  Length of Stay: 5  Alford Highland, NP  11/28/2018, 7:33 AM  Advanced Heart Failure Team Pager [phone] (M-F; 7a - 4p)  Please contact [facility] for night-coverage after hours (4p -7a ) and weekends on [email or web address]  Patient seen with NP, agree with the above note.   He feels good this today, look euvolemic on exam, BP stable.   We will let him go home today.    Meds for discharge:  Amiodarone 200 mg daily apixaban 2.5 bid Plavix 75 daily Digoxin 0.0625 daily Hydralazine 12.5  tid Imdur 15 daily KCl 20 daily Crestor 10 daily Spironolactone 12.5 daily Torsemide 60 qam/40 qpm  Marca AnconaDalton Ladon Vandenberghe 11/28/2018 1:16 PM

## 2018-11-28 NOTE — Progress Notes (Signed)
avs reviewed with wife and pt, reviewed meds with pt several times and answered questions, [facility], [facility] given [facility], pt instructed several times on isosrobide mononitrate which is once and a  Day and to STOP the iso dinatire

## 2018-11-30 ENCOUNTER — Other Ambulatory Visit (HOSPITAL_COMMUNITY): Payer: Self-pay

## 2018-12-06 ENCOUNTER — Ambulatory Visit: Payer: Non-veteran care | Admitting: Cardiovascular Disease

## 2018-12-11 NOTE — Progress Notes (Signed)
Advanced Heart Failure Clinic Note   Referring Physician: PCP: [facility] PCP-Cardiologist: [name], MD   HPI:  Ryan Macdonald is a 85 y.o. male with a history ofsystolic HF due toNICM, Biotronic ICD, A Fibon Eliquis, DMII, HTN,CAD s/p pLAD stent in 07/2018 with residual non-obstructive disease,chronic hypoxic respiratory failure on home O2 PRN,and depression.  Admitted 12/26 - 11/28/18 with A/C systolic CHF. Diuresed 21 lbs and meds adjusted as tolerated. Echo showed EF 20-25%. EP was consulted for RV pacing and ICD settings were adjusted.  He presents today for regular follow up. He is feeling great and very thankful for his care. He lives at home with his wife who has been helping him. He sleeps on one pillow and denies DOE around the house. No SOB with ADLs, orthopnea, or PND. No lightheadedness or dizziness. Taking all medications as directed. He has previously had a sleep study that was negative. He denies CP or palpitations. He is seen by the [place] in [place], and prefers to keep his EP care there.   Review of systems complete and found to be negative unless listed in HPI.    Past Medical History 1. A/C Systolic Heart Failure. Due to NICM. EF has been low since at least 2014. -He has single chamber Biotronik ICD. Narrow QRS.Settings adjusted to prevent RV pacing. -Echo12/27/19: EF 20-25%, grade 3 DD, trivial AI, mod MR, LA mild to moderately dilated, RV moderately reduced, RA mildly dilated, PA peak pressure 39 mm Hg -Hold BB with low BP and long PR interval -No ACEI or ARNI with allergy to lisinopril.Hold off on ARB with CKD. - Consider cardiomems. 2. CAD - S/p PCI 07/2018 to pLAD - Continue plavix x 6 months(untilMarch 2020)+ apixiban 2.5 mg BID. 3.ParoxysmalA fib -On eliquis2.5mg  twice a day (age >70, creatinine >1.5 generally). If creatinine remains <1.5, could consider increasing Eliquis to 5 mg BID 4. RV pacing - He had an ICD  program change in 05/2018 and sounds like he has been RV pacing since then. - Biotronic ICD reprogrammed12/27/19 by EPto prevent RV pacing and he had no further pacing. 5. CKD - Creatinine baseline seems to be 1.6-1.8, but lots of variation 6. Hypomagnesium  -Resolved with supplementation.   Past Medical History:  Diagnosis Date  . Atrial fibrillation (HCC)   . Cardiomyopathy (HCC)   . Cat scratch fever    removed mass on right side neck  . Cataracts, bilateral   . CHF (congestive heart failure) (HCC)   . Chronic kidney disease   . Corns and callosities   . Diabetes mellitus without complication (HCC)    TYPE 2  . Dyspnea   . Erythema intertrigo   . Flat foot   . Gout   . High cholesterol   . Hx of urethral stricture   . Hypertension   . Kidney stones   . Morbid obesity (HCC)   . Nonischemic cardiomyopathy (HCC)    a. ? 2009 Cath in [place] - nl cors per pt;  b. 09/2013 Echo: EF 25-30%, sev diff HK.  . Obesity   . Osteoarthritis   . PAF (paroxysmal atrial fibrillation) (HCC)    a. post-op hip in 2014 - prev on xarelto.  . Renal insufficiency    Current Outpatient Medications  Medication Sig Dispense Refill  . amiodarone (PACERONE) 200 MG tablet Take 1 tablet (200 mg total) by mouth daily. 30 tablet 5  . apixaban (ELIQUIS) 2.5 MG TABS tablet Take 1 tablet (2.5 mg total)  by mouth 2 (two) times daily. (Patient taking differently: Take 2.5 mg by mouth 2 (two) times daily. Caution - blood thinner) 90 tablet 0  . aspirin EC 81 MG tablet Take 81 mg by mouth daily.    . clopidogrel (PLAVIX) 75 MG tablet Take 1 tablet (75 mg total) by mouth daily with breakfast. 30 tablet 0  . cyanocobalamin 1000 MCG tablet Take 1,000 mcg by mouth daily.     . Digoxin 62.5 MCG TABS Take 0.0625 mg by mouth daily. 30 tablet 5  . finasteride (PROSCAR) 5 MG tablet Take 1 tablet (5 mg total) by mouth daily. 30 tablet 0  . furosemide (LASIX) 40 MG tablet Take 80 mg by mouth daily.    Marland Kitchen glipiZIDE  (GLUCOTROL) 5 MG tablet Take 5 mg by mouth daily before breakfast.    . hydrALAZINE (APRESOLINE) 25 MG tablet Take 0.5 tablets (12.5 mg total) by mouth every 8 (eight) hours. 45 tablet 5  . isosorbide mononitrate (IMDUR) 30 MG 24 hr tablet Take 0.5 tablets (15 mg total) by mouth daily. 15 tablet 5  . magnesium oxide (MAG-OX) 400 MG tablet Take 400 mg by mouth daily.    . metoprolol (TOPROL-XL) 200 MG 24 hr tablet Take 100 mg by mouth daily.    . potassium chloride SA (K-DUR,KLOR-CON) 20 MEQ tablet Take 20 mEq by mouth daily.    . rosuvastatin (CRESTOR) 10 MG tablet Take 1 tablet (10 mg total) by mouth daily at 6 PM. 30 tablet 5  . sertraline (ZOLOFT) 25 MG tablet Take 25 mg by mouth daily.     Marland Kitchen spironolactone (ALDACTONE) 25 MG tablet Take 1 tablet (25 mg total) by mouth at bedtime. 30 tablet 5  . torsemide (DEMADEX) 20 MG tablet Take 60 mg (3 tabs) in am and 40 mg (2 tabs) in pm 150 tablet 5  . allopurinol (ZYLOPRIM) 100 MG tablet Take 200 mg by mouth daily as needed (for gout symptoms).     . benzonatate (TESSALON) 100 MG capsule Take 1 capsule (100 mg total) by mouth 3 (three) times daily as needed for cough. (Patient not taking: Reported on 12/12/2018) 180 capsule 1  . diclofenac sodium (VOLTAREN) 1 % GEL Apply 4 g topically 4 (four) times daily. (Patient not taking: Reported on 12/12/2018) 1 Tube 0  . docusate sodium (COLACE) 100 MG capsule Take 100 mg by mouth 2 (two) times daily.    . fluticasone furoate-vilanterol (BREO ELLIPTA) 100-25 MCG/INH AEPB Inhale 1 puff into the lungs daily. (Patient not taking: Reported on 11/27/2018) 60 each 3  . HYDROcodone-acetaminophen (NORCO/VICODIN) 5-325 MG tablet Take 2 tablets by mouth every 4 (four) hours as needed. (Patient taking differently: Take 1 tablet by mouth 2 (two) times daily as needed (for pain). ) 6 tablet 0  . ipratropium-albuterol (DUONEB) 0.5-2.5 (3) MG/3ML SOLN Take 3 mLs by nebulization every 4 (four) hours as needed. (Patient taking  differently: Take 3 mLs by nebulization every 4 (four) hours as needed (shortness of breath). ) 360 mL 0  . ketoconazole (NIZORAL) 2 % shampoo Apply 1 application topically 2 (two) times a week.    . nitroGLYCERIN (NITROSTAT) 0.4 MG SL tablet Place 1 tablet (0.4 mg total) under the tongue every 5 (five) minutes x 3 doses as needed for chest pain. 25 tablet 0  . senna-docusate (SENOKOT S) 8.6-50 MG tablet Take 1 tablet by mouth 2 (two) times daily. (Patient taking differently: Take 1 tablet by mouth every other day. ) 60  tablet 0   No current facility-administered medications for this encounter.    Allergies  Allergen Reactions  . Lisinopril Swelling    Facial swelling  . Xarelto [Rivaroxaban] Other (See Comments)    "Blood came out of my penis"    Social History   Socioeconomic History  . Marital status: Married    Spouse name: Not on file  . Number of children: Not on file  . Years of education: Not on file  . Highest education level: Not on file  Occupational History  . Occupation: Retired    Comment: Former Patent examiner  . Financial resource strain: Not on file  . Food insecurity:    Worry: Not on file    Inability: Not on file  . Transportation needs:    Medical: Not on file    Non-medical: Not on file  Tobacco Use  . Smoking status: Former Smoker    Packs/day: 1.00    Years: 15.00    Pack years: 15.00    Types: Cigarettes, Cigars  . Smokeless tobacco: Former Neurosurgeon  . Tobacco comment: quit smoking ~ 50 yr ago  Substance and Sexual Activity  . Alcohol use: No    Alcohol/week: 2.0 standard drinks    Types: 2 Cans of beer per week    Comment: rare beer.  . Drug use: No  . Sexual activity: Yes  Lifestyle  . Physical activity:    Days per week: Not on file    Minutes per session: Not on file  . Stress: Not on file  Relationships  . Social connections:    Talks on phone: Not on file    Gets together: Not on file    Attends religious service: Not on file     Active member of club or organization: Not on file    Attends meetings of clubs or organizations: Not on file    Relationship status: Not on file  . Intimate partner violence:    Fear of current or ex partner: No    Emotionally abused: No    Physically abused: No    Forced sexual activity: No  Other Topics Concern  . Not on file  Social History Narrative   Lives in [place] with wife. Moved from [place] (2013). Does not routinely exercise.   Family History  Problem Relation Age of Onset  . Heart disease Mother   . Heart Problems Maternal Grandmother   . Other Other        negative for premature CAD   Vitals:   12/12/18 1401  BP: 118/76  Pulse: 78  SpO2: 94%  Weight: 98.7 kg (217 lb 9.6 oz)    Wt Readings from Last 3 Encounters:  12/12/18 98.7 kg (217 lb 9.6 oz)  11/28/18 99.7 kg (219 lb 14.4 oz)  11/08/18 110.2 kg (243 lb)    PHYSICAL EXAM: General:  NAD.  HEENT: normal Neck: supple. JVP does not appear elevated. Carotids 2+ bilat; no bruits. No lymphadenopathy or thyromegaly appreciated. Cor: PMI nondisplaced. Regular rate & rhythm. No rubs, gallops or murmurs. Lungs: clear Abdomen: soft, nontender, nondistended. No hepatosplenomegaly. No bruits or masses. Good bowel sounds. Extremities: no cyanosis, clubbing, rash, edema Neuro: alert & oriented x 3, cranial nerves grossly intact. moves all 4 extremities w/o difficulty. Affect pleasant.  ECG today shows NSR 72 bpm with 1st degree AV block.   ASSESSMENT & PLAN:  1. Chronic systolic Heart Failure. Due to NICM. EF has been low since  at least 2014. -He has single chamber Biotronik ICD. Narrow QRS.Settings adjusted to prevent RV pacing. -Echo12/27/19: EF 20-25%, grade 3 DD, trivial AI, mod MR, LA mild to moderately dilated, RV moderately reduced, RA mildly dilated, PA peak pressure 39 mm Hg - NYHA II-III symptoms chronically, confounded by comorbidities.  - Volume status stable on exam.   -Continuetorsemide 60  mg am, 40 mg pm (increased from torsemide 40 mg BID) - Increase spiro to 25 mg qhs. BMET today and 10 days.  - Continue hydral 12.5 mg TID + imdur 15 mg daily. [facility]il [facility]. -Continuedigoxin 0.0625 mg daily.  -Hold BB with low BP and long PR interval -No ACEI or ARNI with allergy to lisinopril.Hold off on ARB with CKD. - Consider cardiomems.  2. CAD - S/p PCI 07/2018 to pLAD - No s/s of ischemia.    - Continue plavix x 6 months(untilMarch 2020)+ apixiban 2.5 mg BID. - Continue crestor  3.ParoxysmalA fib - EKG today shows NSR 72 bpm with 1st degree AV block.  - Continue amiodarone 200 mg daily - Continue eliquis2.5mg  twice a day (age 50>80, creatinine >1.5 generally). If creatinine remains <1.5, could consider increasing Eliquis to 5 mg BID - Continue amio 200 mg daily  4. RV pacing - He had an ICD program change in July and sounds like he has been RV pacing since then. - Biotronic ICD reprogrammed12/27 by EPto prevent RV pacing and he had no further pacing. - [facility]. Unable to interrogate Biotronic device in clinic.   5. CKD III - Creatinine baseline 1.6-1.8 with high degree of lability.  - BMET today.   6. Hypomagnesium  -Continue supp.   Doing well overall. Meds as above. [facility]. RTC 4 weeks. Sooner with symptoms.  Graciella FreerMichael Andrew Tamatha Gadbois, PA-C 12/12/18   Greater than 50% of the 25 minute visit was spent in counseling/coordination of care regarding disease state education, salt/fluid restriction, sliding scale diuretics, and medication compliance.

## 2018-12-12 ENCOUNTER — Ambulatory Visit (HOSPITAL_COMMUNITY)
Admission: RE | Admit: 2018-12-12 | Discharge: 2018-12-12 | Disposition: A | Discharge status: Home / Self Care | Payer: No Typology Code available for payment source | Source: Ambulatory Visit | Attending: Internal Medicine | Admitting: Internal Medicine

## 2018-12-12 ENCOUNTER — Encounter (HOSPITAL_COMMUNITY): Payer: Self-pay

## 2018-12-12 VITALS — BP 118/76 | HR 78 | Wt 217.6 lb

## 2018-12-12 DIAGNOSIS — I2510 Atherosclerotic heart disease of native coronary artery without angina pectoris: Secondary | ICD-10-CM | POA: Insufficient documentation

## 2018-12-12 DIAGNOSIS — I5022 Chronic systolic (congestive) heart failure: Secondary | ICD-10-CM | POA: Insufficient documentation

## 2018-12-12 DIAGNOSIS — I130 Hypertensive heart and chronic kidney disease with heart failure and stage 1 through stage 4 chronic kidney disease, or unspecified chronic kidney disease: Secondary | ICD-10-CM | POA: Insufficient documentation

## 2018-12-12 DIAGNOSIS — Z7984 Long term (current) use of oral hypoglycemic drugs: Secondary | ICD-10-CM | POA: Insufficient documentation

## 2018-12-12 DIAGNOSIS — Z888 Allergy status to other drugs, medicaments and biological substances status: Secondary | ICD-10-CM | POA: Insufficient documentation

## 2018-12-12 DIAGNOSIS — I43 Cardiomyopathy in diseases classified elsewhere: Secondary | ICD-10-CM

## 2018-12-12 DIAGNOSIS — I428 Other cardiomyopathies: Secondary | ICD-10-CM | POA: Insufficient documentation

## 2018-12-12 DIAGNOSIS — E1122 Type 2 diabetes mellitus with diabetic chronic kidney disease: Secondary | ICD-10-CM | POA: Insufficient documentation

## 2018-12-12 DIAGNOSIS — M1990 Unspecified osteoarthritis, unspecified site: Secondary | ICD-10-CM | POA: Insufficient documentation

## 2018-12-12 DIAGNOSIS — Z7901 Long term (current) use of anticoagulants: Secondary | ICD-10-CM | POA: Insufficient documentation

## 2018-12-12 DIAGNOSIS — I440 Atrioventricular block, first degree: Secondary | ICD-10-CM | POA: Insufficient documentation

## 2018-12-12 DIAGNOSIS — Z87891 Personal history of nicotine dependence: Secondary | ICD-10-CM | POA: Insufficient documentation

## 2018-12-12 DIAGNOSIS — I5023 Acute on chronic systolic (congestive) heart failure: Secondary | ICD-10-CM | POA: Diagnosis not present

## 2018-12-12 DIAGNOSIS — M109 Gout, unspecified: Secondary | ICD-10-CM | POA: Insufficient documentation

## 2018-12-12 DIAGNOSIS — Z8249 Family history of ischemic heart disease and other diseases of the circulatory system: Secondary | ICD-10-CM | POA: Insufficient documentation

## 2018-12-12 DIAGNOSIS — Z79899 Other long term (current) drug therapy: Secondary | ICD-10-CM | POA: Insufficient documentation

## 2018-12-12 DIAGNOSIS — E7800 Pure hypercholesterolemia, unspecified: Secondary | ICD-10-CM | POA: Insufficient documentation

## 2018-12-12 DIAGNOSIS — N183 Chronic kidney disease, stage 3 (moderate): Secondary | ICD-10-CM | POA: Insufficient documentation

## 2018-12-12 DIAGNOSIS — Z7982 Long term (current) use of aspirin: Secondary | ICD-10-CM | POA: Insufficient documentation

## 2018-12-12 DIAGNOSIS — Z87442 Personal history of urinary calculi: Secondary | ICD-10-CM | POA: Insufficient documentation

## 2018-12-12 DIAGNOSIS — N289 Disorder of kidney and ureter, unspecified: Secondary | ICD-10-CM

## 2018-12-12 DIAGNOSIS — Z7902 Long term (current) use of antithrombotics/antiplatelets: Secondary | ICD-10-CM | POA: Insufficient documentation

## 2018-12-12 DIAGNOSIS — I480 Paroxysmal atrial fibrillation: Secondary | ICD-10-CM | POA: Insufficient documentation

## 2018-12-12 LAB — BASIC METABOLIC PANEL
Anion gap: 13 (ref 5–15)
BUN: 25 mg/dL — ABNORMAL HIGH (ref 8–23)
CO2: 24 mmol/L (ref 22–32)
Calcium: 9.3 mg/dL (ref 8.9–10.3)
Chloride: 100 mmol/L (ref 98–111)
Creatinine, Ser: 1.94 mg/dL — ABNORMAL HIGH (ref 0.61–1.24)
GFR calc Af Amer: 36 mL/min — ABNORMAL LOW (ref >60)
GFR calc non Af Amer: 31 mL/min — ABNORMAL LOW (ref >60)
Glucose, Bld: 115 mg/dL — ABNORMAL HIGH (ref 70–99)
Potassium: 4 mmol/L (ref 3.5–5.1)
Sodium: 137 mmol/L (ref 135–145)

## 2018-12-12 LAB — DIGOXIN LEVEL: Digoxin Level: 0.3 ng/mL — ABNORMAL LOW (ref 0.8–2.0)

## 2018-12-12 MED ORDER — AMIODARONE HCL 200 MG PO TABS: 200.0000 mg | ORAL_TABLET | Freq: Every day | ORAL | 5 refills | Status: DC

## 2018-12-12 MED ORDER — ISOSORBIDE MONONITRATE ER 30 MG PO TB24: 15.0000 mg | ORAL_TABLET | Freq: Every day | ORAL | 5 refills | Status: DC

## 2018-12-12 MED ORDER — DIGOXIN 62.5 MCG PO TABS: 0.0625 mg | ORAL_TABLET | Freq: Every day | ORAL | 5 refills | Status: DC

## 2018-12-12 MED ORDER — HYDRALAZINE HCL 25 MG PO TABS: 12.5000 mg | ORAL_TABLET | Freq: Three times a day (TID) | ORAL | 5 refills | Status: DC

## 2018-12-12 MED ORDER — SPIRONOLACTONE 25 MG PO TABS: 25.0000 mg | ORAL_TABLET | Freq: Every day | ORAL | 5 refills | Status: DC

## 2018-12-12 NOTE — Patient Instructions (Addendum)
INCREASE Spironolactone to 25 mg one tab daily  Labs today We will only contact you if something comes back abnormal or we need to make some changes. Otherwise no news is good news!  Labs needed in 7-10 days  Your physician has requested that you have an echocardiogram. Echocardiography is a painless test that uses sound waves to create images of your heart. It provides your doctor with information about the size and shape of your heart and how well your heart's chambers and valves are working. This procedure takes approximately one hour. There are no restrictions for this procedure.  Your physician recommends that you schedule a follow-up appointment in: 4 weeks  in the Advanced Practitioners (PA/NP) Clinic    Your physician recommends that you schedule a follow-up appointment in: 3 months with Dr Shirlee Latch and echp   Do the following things EVERYDAY: 1) Weigh yourself in the morning before breakfast. Write it down and keep it in a log. 2) Take your medicines as prescribed 3) Eat low salt foods-Limit salt (sodium) to 2000 mg per day.  4) Stay as active as you can everyday 5) Limit all fluids for the day to less than 2 liters   At the Advanced Heart Failure Clinic, you and your health needs are our priority. As part of our continuing mission to provide you with exceptional heart care, we have created designated Provider Care Teams. These Care Teams include your primary Cardiologist (physician) and Advanced Practice Providers (APPs- Physician Assistants and Nurse Practitioners) who all work together to provide you with the care you need, when you need it.   You may see any of the following providers on your designated Care Team at your next follow up: Marland Kitchen Dr Arvilla Meres . Dr [name] . Tonye Becket, NP . Duwaine Maxin, NP . Joanell Rising

## 2018-12-19 ENCOUNTER — Ambulatory Visit (HOSPITAL_COMMUNITY)
Admission: RE | Admit: 2018-12-19 | Discharge: 2018-12-19 | Disposition: A | Discharge status: Home / Self Care | Payer: Non-veteran care | Source: Ambulatory Visit | Attending: Cardiology | Admitting: Cardiology

## 2018-12-19 VITALS — BP 142/76

## 2018-12-19 DIAGNOSIS — I5023 Acute on chronic systolic (congestive) heart failure: Secondary | ICD-10-CM

## 2018-12-19 LAB — BASIC METABOLIC PANEL
Anion gap: 13 (ref 5–15)
BUN: 33 mg/dL — ABNORMAL HIGH (ref 8–23)
CO2: 25 mmol/L (ref 22–32)
Calcium: 9.3 mg/dL (ref 8.9–10.3)
Chloride: 98 mmol/L (ref 98–111)
Creatinine, Ser: 1.92 mg/dL — ABNORMAL HIGH (ref 0.61–1.24)
GFR calc Af Amer: 37 mL/min — ABNORMAL LOW (ref >60)
GFR, EST NON AFRICAN AMERICAN: 32 mL/min — AB (ref >60)
GLUCOSE: 101 mg/dL — AB (ref 70–99)
Potassium: 4.2 mmol/L (ref 3.5–5.1)
Sodium: 136 mmol/L (ref 135–145)

## 2018-12-19 NOTE — Progress Notes (Signed)
Pt c/o having a low blood pressure and wanted it rechecked. Blood pressure was within normal limits

## 2018-12-19 NOTE — Addendum Note (Signed)
Encounter addended by: Nicole Cella, RN on: 12/19/2018 10:59 AM  Actions taken: Clinical Note Signed, Vitals modified

## 2018-12-22 ENCOUNTER — Other Ambulatory Visit: Payer: Self-pay | Admitting: Cardiovascular Disease

## 2018-12-22 MED ORDER — DIGOXIN 62.5 MCG PO TABS: 0.0625 mg | ORAL_TABLET | Freq: Every day | ORAL | 5 refills | Status: DC

## 2018-12-22 MED ORDER — ROSUVASTATIN CALCIUM 10 MG PO TABS: 10.0000 mg | ORAL_TABLET | Freq: Every day | ORAL | 5 refills | Status: DC

## 2018-12-22 MED ORDER — AMIODARONE HCL 200 MG PO TABS: 200.0000 mg | ORAL_TABLET | Freq: Every day | ORAL | 5 refills | Status: DC

## 2018-12-22 NOTE — Telephone Encounter (Signed)
New Message     *STAT* If patient is at the pharmacy, call can be transferred to refill team.   1. Which medications need to be refilled? (please list name of each medication and dose if known) Amiodarone 200Mg , Digoxin 0.125mg , Rosuvastatin 10mg ,    2. Which pharmacy/location (including street and city if local pharmacy) is medication to be sent to? [facility] [phone]  3. Do they need a 30 day or 90 day supply? 30 day supply

## 2019-01-02 ENCOUNTER — Encounter (HOSPITAL_COMMUNITY): Payer: Non-veteran care | Admitting: Cardiology

## 2019-01-09 ENCOUNTER — Ambulatory Visit (HOSPITAL_COMMUNITY)
Admission: RE | Admit: 2019-01-09 | Discharge: 2019-01-09 | Disposition: A | Discharge status: Home / Self Care | Payer: Non-veteran care | Source: Ambulatory Visit | Attending: Cardiology | Admitting: Cardiology

## 2019-01-09 ENCOUNTER — Telehealth (HOSPITAL_COMMUNITY): Payer: Self-pay | Admitting: Cardiology

## 2019-01-09 VITALS — BP 120/60 | HR 50 | Wt 216.6 lb

## 2019-01-09 DIAGNOSIS — Z7902 Long term (current) use of antithrombotics/antiplatelets: Secondary | ICD-10-CM | POA: Diagnosis not present

## 2019-01-09 DIAGNOSIS — Z7984 Long term (current) use of oral hypoglycemic drugs: Secondary | ICD-10-CM | POA: Diagnosis not present

## 2019-01-09 DIAGNOSIS — I428 Other cardiomyopathies: Secondary | ICD-10-CM | POA: Diagnosis not present

## 2019-01-09 DIAGNOSIS — I2510 Atherosclerotic heart disease of native coronary artery without angina pectoris: Secondary | ICD-10-CM

## 2019-01-09 DIAGNOSIS — I5022 Chronic systolic (congestive) heart failure: Secondary | ICD-10-CM | POA: Diagnosis present

## 2019-01-09 DIAGNOSIS — M109 Gout, unspecified: Secondary | ICD-10-CM | POA: Diagnosis not present

## 2019-01-09 DIAGNOSIS — I480 Paroxysmal atrial fibrillation: Secondary | ICD-10-CM

## 2019-01-09 DIAGNOSIS — M1990 Unspecified osteoarthritis, unspecified site: Secondary | ICD-10-CM | POA: Diagnosis not present

## 2019-01-09 DIAGNOSIS — I130 Hypertensive heart and chronic kidney disease with heart failure and stage 1 through stage 4 chronic kidney disease, or unspecified chronic kidney disease: Secondary | ICD-10-CM | POA: Insufficient documentation

## 2019-01-09 DIAGNOSIS — Z79899 Other long term (current) drug therapy: Secondary | ICD-10-CM | POA: Diagnosis not present

## 2019-01-09 DIAGNOSIS — Z7901 Long term (current) use of anticoagulants: Secondary | ICD-10-CM | POA: Insufficient documentation

## 2019-01-09 DIAGNOSIS — Z8249 Family history of ischemic heart disease and other diseases of the circulatory system: Secondary | ICD-10-CM | POA: Insufficient documentation

## 2019-01-09 DIAGNOSIS — N183 Chronic kidney disease, stage 3 unspecified: Secondary | ICD-10-CM

## 2019-01-09 DIAGNOSIS — E7800 Pure hypercholesterolemia, unspecified: Secondary | ICD-10-CM | POA: Diagnosis not present

## 2019-01-09 DIAGNOSIS — Z7982 Long term (current) use of aspirin: Secondary | ICD-10-CM | POA: Insufficient documentation

## 2019-01-09 DIAGNOSIS — F17290 Nicotine dependence, other tobacco product, uncomplicated: Secondary | ICD-10-CM | POA: Diagnosis not present

## 2019-01-09 DIAGNOSIS — I959 Hypotension, unspecified: Secondary | ICD-10-CM | POA: Insufficient documentation

## 2019-01-09 DIAGNOSIS — E1122 Type 2 diabetes mellitus with diabetic chronic kidney disease: Secondary | ICD-10-CM | POA: Insufficient documentation

## 2019-01-09 LAB — BASIC METABOLIC PANEL
Anion gap: 16 — ABNORMAL HIGH (ref 5–15)
BUN: 30 mg/dL — ABNORMAL HIGH (ref 8–23)
CO2: 24 mmol/L (ref 22–32)
Calcium: 9.2 mg/dL (ref 8.9–10.3)
Chloride: 97 mmol/L — ABNORMAL LOW (ref 98–111)
Creatinine, Ser: 2.35 mg/dL — ABNORMAL HIGH (ref 0.61–1.24)
GFR calc non Af Amer: 25 mL/min — ABNORMAL LOW (ref >60)
GFR, EST AFRICAN AMERICAN: 29 mL/min — AB (ref >60)
Glucose, Bld: 131 mg/dL — ABNORMAL HIGH (ref 70–99)
Potassium: 4.6 mmol/L (ref 3.5–5.1)
Sodium: 137 mmol/L (ref 135–145)

## 2019-01-09 LAB — CBC
HCT: 41.2 % (ref 39.0–52.0)
Hemoglobin: 12.8 g/dL — ABNORMAL LOW (ref 13.0–17.0)
MCH: 28.2 pg (ref 26.0–34.0)
MCHC: 31.1 g/dL (ref 30.0–36.0)
MCV: 90.7 fL (ref 80.0–100.0)
NRBC: 0 % (ref 0.0–0.2)
Platelets: 274 10*3/uL (ref 150–400)
RBC: 4.54 MIL/uL (ref 4.22–5.81)
RDW: 16.3 % — ABNORMAL HIGH (ref 11.5–15.5)
WBC: 6 10*3/uL (ref 4.0–10.5)

## 2019-01-09 LAB — MAGNESIUM: Magnesium: 2 mg/dL (ref 1.7–2.4)

## 2019-01-09 MED ORDER — AMIODARONE HCL 200 MG PO TABS: 200.0000 mg | ORAL_TABLET | Freq: Every day | ORAL | 3 refills | Status: DC

## 2019-01-09 MED ORDER — SPIRONOLACTONE 25 MG PO TABS: 12.5000 mg | ORAL_TABLET | Freq: Every day | ORAL | 6 refills | Status: DC

## 2019-01-09 MED ORDER — TORSEMIDE 20 MG PO TABS: 40.0000 mg | ORAL_TABLET | Freq: Two times a day (BID) | ORAL | 3 refills | Status: DC

## 2019-01-09 NOTE — Patient Instructions (Addendum)
Lab work done today. We will notify you of any abnormal lab work.  INCREASE Torsemide 40mg  (2 tabs) twice daily  SPIRONOLACTONE 12.5 ( 0.5 tab) daily  DECREASE Amiodarone 200mg  (1 tab) daily  STOP Furosemide (Lasix)  You have been referred to Paramedicine. They will contact you in order to schedule an appointment.  Follow up with the Advanced Practice Providers in 3-4 weeks.

## 2019-01-09 NOTE — Progress Notes (Signed)
Advanced Heart Failure Clinic Note   Referring Physician: [facility] PCP-Cardiologist: Chilton Siiffany [place], MD   HPI:  Ryan Macdonald is a 85 y.o. male with a history ofsystolic HF due toNICM, Biotronic ICD, A Fibon Eliquis, DMII, HTN,CAD s/p pLAD stent in 07/2018 with residual non-obstructive disease,chronic hypoxic respiratory failure on home O2 PRN,and depression.  Admitted 12/26 - 11/28/18 with A/C systolic CHF. Diuresed 21 lbs and meds adjusted as tolerated. Echo showed EF 20-25%. EP was consulted for RV pacing and ICD settings were adjusted.  He presents today for regular follow up. Last visit spironolactone increased on our end, but it is not clear if that change was ever made. Multiple med discrepancies today. Including having lasix and torsemide. Lasix is an old bottle that says "take 40 mg as needed". But he says he is taking daily, along with his torsemide. Overall he is feeling OK, though had an episode of lightheadedness last week. PCP encouraged him to follow up with us.  He is getting conflicting [facility] at times [facility] and [facility]. He denies CP or palpitations. Denies SOB getting around the house. Is very thankful for how much better he feels. No ICD shocks.   Review of systems complete and found to be negative unless listed in HPI.    Past Medical History 1. A/C Systolic Heart Failure. Due to NICM. EF has been low since at least 2014. -He has single chamber Biotronik ICD. Narrow QRS.Settings adjusted to prevent RV pacing. -Echo12/27/19: EF 20-25%, grade 3 DD, trivial AI, mod MR, LA mild to moderately dilated, RV moderately reduced, RA mildly dilated, PA peak pressure 39 mm Hg -Hold BB with low BP and long PR interval -No ACEI or ARNI with allergy to lisinopril.Hold off on ARB with CKD. - Consider cardiomems. 2. CAD - S/p PCI 07/2018 to pLAD - Continue plavix x 6 months(untilMarch 2020)+ apixiban 2.5 mg BID. 3.ParoxysmalA fib -On  eliquis2.5mg  twice a day (age 79>80, creatinine >1.5 generally). If creatinine remains <1.5, could consider increasing Eliquis to 5 mg BID 4. RV pacing - He had an ICD program change in 05/2018 and sounds like he has been RV pacing since then. - Biotronic ICD reprogrammed12/27/19 by EPto prevent RV pacing and he had no further pacing. 5. CKD - Creatinine baseline seems to be 1.6-1.8, but lots of variation 6. Hypomagnesium  -Resolved with supplementation.   Past Medical History:  Diagnosis Date  . Atrial fibrillation (HCC)   . Cardiomyopathy (HCC)   . Cat scratch fever    removed mass on right side neck  . Cataracts, bilateral   . CHF (congestive heart failure) (HCC)   . Chronic kidney disease   . Corns and callosities   . Diabetes mellitus without complication (HCC)    TYPE 2  . Dyspnea   . Erythema intertrigo   . Flat foot   . Gout   . High cholesterol   . Hx of urethral stricture   . Hypertension   . Kidney stones   . Morbid obesity (HCC)   . Nonischemic cardiomyopathy (HCC)    a. ? 2009 Cath in [place] - nl cors per pt;  b. 09/2013 Echo: EF 25-30%, sev diff HK.  . Obesity   . Osteoarthritis   . PAF (paroxysmal atrial fibrillation) (HCC)    a. post-op hip in 2014 - prev on xarelto.  . Renal insufficiency    Current Outpatient Medications  Medication Sig Dispense Refill  . allopurinol (ZYLOPRIM) 100 MG tablet  Take 200 mg by mouth daily as needed (for gout symptoms).     Marland Kitchen amiodarone (PACERONE) 200 MG tablet Take 200 mg by mouth 2 (two) times daily.    Marland Kitchen apixaban (ELIQUIS) 2.5 MG TABS tablet Take 1 tablet (2.5 mg total) by mouth 2 (two) times daily. (Patient taking differently: Take 2.5 mg by mouth 2 (two) times daily. Caution - blood thinner) 90 tablet 0  . clopidogrel (PLAVIX) 75 MG tablet Take 1 tablet (75 mg total) by mouth daily with breakfast. 30 tablet 0  . cyanocobalamin 1000 MCG tablet Take 1,000 mcg by mouth daily.     . Digoxin 62.5 MCG TABS Take 0.0625 mg by  mouth daily. 30 tablet 5  . finasteride (PROSCAR) 5 MG tablet Take 1 tablet (5 mg total) by mouth daily. 30 tablet 0  . furosemide (LASIX) 40 MG tablet Take 80 mg by mouth daily.    Marland Kitchen glipiZIDE (GLUCOTROL) 5 MG tablet Take 5 mg by mouth daily before breakfast.    . hydrALAZINE (APRESOLINE) 25 MG tablet Take 0.5 tablets (12.5 mg total) by mouth every 8 (eight) hours. 45 tablet 5  . isosorbide mononitrate (IMDUR) 30 MG 24 hr tablet Take 0.5 tablets (15 mg total) by mouth daily. 15 tablet 5  . magnesium oxide (MAG-OX) 400 MG tablet Take 400 mg by mouth daily.    . metoprolol (TOPROL-XL) 200 MG 24 hr tablet Take 100 mg by mouth daily.    . potassium chloride SA (K-DUR,KLOR-CON) 20 MEQ tablet Take 20 mEq by mouth daily.    . rosuvastatin (CRESTOR) 10 MG tablet Take 1 tablet (10 mg total) by mouth daily at 6 PM. 30 tablet 5  . spironolactone (ALDACTONE) 25 MG tablet Take 12.5 mg by mouth daily.    Marland Kitchen torsemide (DEMADEX) 20 MG tablet Take 40 mg by mouth daily.    Marland Kitchen aspirin EC 81 MG tablet Take 81 mg by mouth daily.    . benzonatate (TESSALON) 100 MG capsule Take 1 capsule (100 mg total) by mouth 3 (three) times daily as needed for cough. (Patient not taking: Reported on 12/12/2018) 180 capsule 1  . diclofenac sodium (VOLTAREN) 1 % GEL Apply 4 g topically 4 (four) times daily. (Patient not taking: Reported on 12/12/2018) 1 Tube 0  . docusate sodium (COLACE) 100 MG capsule Take 100 mg by mouth 2 (two) times daily.    . fluticasone furoate-vilanterol (BREO ELLIPTA) 100-25 MCG/INH AEPB Inhale 1 puff into the lungs daily. (Patient not taking: Reported on 11/27/2018) 60 each 3  . HYDROcodone-acetaminophen (NORCO/VICODIN) 5-325 MG tablet Take 2 tablets by mouth every 4 (four) hours as needed. (Patient not taking: Reported on 01/09/2019) 6 tablet 0  . ipratropium-albuterol (DUONEB) 0.5-2.5 (3) MG/3ML SOLN Take 3 mLs by nebulization every 4 (four) hours as needed. (Patient not taking: Reported on 01/09/2019) 360 mL 0    . ketoconazole (NIZORAL) 2 % shampoo Apply 1 application topically 2 (two) times a week.    . nitroGLYCERIN (NITROSTAT) 0.4 MG SL tablet Place 1 tablet (0.4 mg total) under the tongue every 5 (five) minutes x 3 doses as needed for chest pain. (Patient not taking: Reported on 01/09/2019) 25 tablet 0  . senna-docusate (SENOKOT S) 8.6-50 MG tablet Take 1 tablet by mouth 2 (two) times daily. (Patient not taking: Reported on 01/09/2019) 60 tablet 0  . sertraline (ZOLOFT) 25 MG tablet Take 25 mg by mouth daily.      No current facility-administered medications for this encounter.  Allergies  Allergen Reactions  . Lisinopril Swelling    Facial swelling  . Xarelto [Rivaroxaban] Other (See Comments)    "Blood came out of my penis"    Social History   Socioeconomic History  . Marital status: Married    Spouse name: Not on file  . Number of children: Not on file  . Years of education: Not on file  . Highest education level: Not on file  Occupational History  . Occupation: Retired    Comment: Former Patent examiner  . Financial resource strain: Not on file  . Food insecurity:    Worry: Not on file    Inability: Not on file  . Transportation needs:    Medical: Not on file    Non-medical: Not on file  Tobacco Use  . Smoking status: Former Smoker    Packs/day: 1.00    Years: 15.00    Pack years: 15.00    Types: Cigarettes, Cigars  . Smokeless tobacco: Former Neurosurgeon  . Tobacco comment: quit smoking ~ 50 yr ago  Substance and Sexual Activity  . Alcohol use: No    Alcohol/week: 2.0 standard drinks    Types: 2 Cans of beer per week    Comment: rare beer.  . Drug use: No  . Sexual activity: Yes  Lifestyle  . Physical activity:    Days per week: Not on file    Minutes per session: Not on file  . Stress: Not on file  Relationships  . Social connections:    Talks on phone: Not on file    Gets together: Not on file    Attends religious service: Not on file    Active member of club  or organization: Not on file    Attends meetings of clubs or organizations: Not on file    Relationship status: Not on file  . Intimate partner violence:    Fear of current or ex partner: No    Emotionally abused: No    Physically abused: No    Forced sexual activity: No  Other Topics Concern  . Not on file  Social History Narrative   Lives in [place] with wife. Moved from [place] (2013). Does not routinely exercise.   Family History  Problem Relation Age of Onset  . Heart disease Mother   . Heart Problems Maternal Grandmother   . Other Other        negative for premature CAD   Vitals:   01/09/19 1135  BP: 120/60  Pulse: (!) 50  SpO2: 91%  Weight: 98.2 kg (216 lb 9.6 oz)    Wt Readings from Last 3 Encounters:  01/09/19 98.2 kg (216 lb 9.6 oz)  12/12/18 98.7 kg (217 lb 9.6 oz)  11/28/18 99.7 kg (219 lb 14.4 oz)    PHYSICAL EXAM: General: NAD HEENT: Normal Neck: Supple. JVP 5-6. Carotids 2+ bilat; no bruits. No thyromegaly or nodule noted. Cor: PMI nondisplaced. RRR, No M/G/R noted Lungs: CTAB, normal effort. Abdomen: Soft, non-tender, non-distended, no HSM. No bruits or masses. +BS  Extremities: No cyanosis, clubbing, or rash. R and LLE no edema.  Neuro: Alert & orientedx3, cranial nerves grossly intact. moves all 4 extremities w/o difficulty. Affect pleasant   ASSESSMENT & PLAN:  1. Chronic systolic Heart Failure. Due to NICM. EF has been low since at least 2014. -He has single chamber Biotronik ICD. Narrow QRS.Settings adjusted to prevent RV pacing. -Echo12/27/19: EF 20-25%, grade 3 DD, trivial AI, mod MR, LA mild  to moderately dilated, RV moderately reduced, RA mildly dilated, PA peak pressure 39 mm Hg - NYHA II-III symptoms chronically, confounded by comorbidities.  - Volume status stable on exam.   - Change torsemide to 40 mg BID. He has been taking 40 mg q am and 20 mg pm, along with LASIX.  - Stop lasix.  - Continue spiro 12.5 mg qhs. It is unclear if  he ever went up.  - Continue hydral 12.5 mg TID + imdur 15 mg daily. Bidil not covered by [facility]. -Continuedigoxin 0.0625 mg daily.  -Hold BB with low BP and long PR interval -No ACEI or ARNI with allergy to lisinopril.Hold off on ARB with CKD. - Consider cardiomems.  2. CAD - S/p PCI 07/2018 to pLAD - No s/s of ischemia.    - Continue plavix x 6 months(untilMarch 2020)+ apixiban 2.5 mg BID. - Continue crestor  3.ParoxysmalA fib - Regular on exam.   - Continue amiodarone 200 mg daily - Continue eliquis2.5mg  twice a day (age 90>80, creatinine >1.5 generally).  4. RV pacing - He had an ICD program change in July and sounds like he has been RV pacing since then. - Biotronic ICD reprogrammed12/27 by EPto prevent RV pacing and he had no further pacing. - [facility]. Unable to interrogate Biotronic device in clinic. No change  5. CKD III - Creatinine baseline 1.8 - 2.0 with high degree of lability, but he has been taking his diuretics wrong.  - BMET today.    6. Hypomagnesium - Continue supp.   [facility] to [facility]. Will ask paramedicine to see for 1-2 visits to help make sure he is taking correct doses of his meds.   Ryan FreerMichael Andrew Wenonah Milo, PA-C 01/09/19   Greater than 50% of the 25 minute visit was spent in counseling/coordination of care regarding disease state education, salt/fluid restriction, sliding scale diuretics, and medication compliance.

## 2019-01-09 NOTE — Telephone Encounter (Signed)
Notes recorded by Theresia Bough, CMA on 01/09/2019 at 4:14 PM EST Patient aware. Patient voiced understanding, repeat labs 2/21  ------  Notes recorded by Graciella Freer, PA-C on 01/09/2019 at 2:48 PM EST His creatinine is elevated, but he has been taking both torsemide and lasix. Needs repeat 10 days. Meds adjusted today.

## 2019-01-09 NOTE — Telephone Encounter (Signed)
-----   Message from Graciella Freer, PA-C sent at 01/09/2019  2:48 PM EST ----- His creatinine is elevated, but he has been taking both torsemide and lasix. Needs repeat 10 days. Meds adjusted today.

## 2019-01-10 ENCOUNTER — Telehealth (HOSPITAL_COMMUNITY): Payer: Self-pay

## 2019-01-10 NOTE — Telephone Encounter (Signed)
Called [facility] for the [facility]: Randa Ngo in order to update them on the medication changes made at last visit. Left voicemail

## 2019-01-15 ENCOUNTER — Ambulatory Visit: Payer: Medicare Other | Admitting: Podiatry

## 2019-01-17 ENCOUNTER — Ambulatory Visit: Payer: Medicare Other | Admitting: Podiatry

## 2019-01-18 ENCOUNTER — Ambulatory Visit (HOSPITAL_COMMUNITY)
Admission: RE | Admit: 2019-01-18 | Discharge: 2019-01-18 | Disposition: A | Discharge status: Home / Self Care | Payer: Non-veteran care | Source: Ambulatory Visit | Attending: Cardiology | Admitting: Cardiology

## 2019-01-18 ENCOUNTER — Other Ambulatory Visit (HOSPITAL_COMMUNITY): Payer: Self-pay

## 2019-01-18 DIAGNOSIS — I5022 Chronic systolic (congestive) heart failure: Secondary | ICD-10-CM | POA: Insufficient documentation

## 2019-01-18 LAB — BASIC METABOLIC PANEL
Anion gap: 11 (ref 5–15)
BUN: 40 mg/dL — ABNORMAL HIGH (ref 8–23)
CO2: 28 mmol/L (ref 22–32)
Calcium: 9.2 mg/dL (ref 8.9–10.3)
Chloride: 98 mmol/L (ref 98–111)
Creatinine, Ser: 2.23 mg/dL — ABNORMAL HIGH (ref 0.61–1.24)
GFR calc Af Amer: 31 mL/min — ABNORMAL LOW (ref >60)
GFR calc non Af Amer: 26 mL/min — ABNORMAL LOW (ref >60)
Glucose, Bld: 119 mg/dL — ABNORMAL HIGH (ref 70–99)
POTASSIUM: 4.4 mmol/L (ref 3.5–5.1)
Sodium: 137 mmol/L (ref 135–145)

## 2019-01-18 NOTE — Progress Notes (Signed)
Paramedicine Encounter    Patient ID: Ryan Macdonald, male    DOB: [birth year], 85 y.o.   MRN: [id number]   Patient Care Team: [facility] as PCP - General (General Practice) [name], MD as PCP - Cardiology (Cardiology)  Patient Active Problem List   Diagnosis Date Noted  . Acute exacerbation of CHF (congestive heart failure) (HCC) 11/23/2018  . Acute on chronic heart failure (HCC) 10/19/2018  . Pulmonary emphysema (HCC)   . Chronic respiratory failure with hypoxia (HCC)   . Morbid obesity (HCC) 09/09/2018  . URI (upper respiratory infection) 08/31/2018  . UTI (urinary tract infection) 08/31/2018  . Acute on chronic systolic (congestive) heart failure (HCC) 08/28/2018  . CHF (congestive heart failure) (HCC) 08/14/2018  . Acute on chronic combined systolic and diastolic CHF (congestive heart failure) (HCC) 07/18/2018  . First degree AV block   . Shortness of breath 04/29/2018  . Acute HFrEF (heart failure with reduced ejection fraction) (HCC) 04/29/2018  . Acute respiratory failure with hypoxia (HCC) 09/27/2017  . CHF exacerbation (HCC) 09/27/2017  . Renal insufficiency   . Acute on chronic systolic CHF (congestive heart failure) (HCC) 07/08/2017  . Left knee pain 07/08/2017  . Acute bronchitis 05/10/2017  . Acute congestive heart failure (HCC) 04/14/2017  . Elevated troponin 04/14/2017  . Lactic acidosis   . Acute on chronic systolic heart failure, NYHA class 2 (HCC) 02/03/2017  . Diabetes mellitus type 2 in obese (HCC) 02/03/2017  . CAP (community acquired pneumonia) 02/03/2017  . Lobar pneumonia (HCC)   . Diabetes mellitus with complication (HCC)   . Acute on chronic respiratory failure with hypoxia (HCC)   . Acute on chronic combined systolic and diastolic heart failure (HCC) 05/19/2016  . Depression 05/19/2016  . Gout 05/19/2016  . 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 NSVT (nonsustained ventricular tachycardia) (HCC)    . Chronic renal insufficiency, stage 3 (moderate) (HCC)   . Pulmonary embolism (HCC)   . Acute respiratory failure (HCC)   . Osteoarthritis of left hip 10/25/2013  . Atrial fibrillation (HCC) 10/25/2013  . S/P hip replacement 10/24/2013  . Nonischemic cardiomyopathy (HCC)   . Obesity (BMI 30-39.9)   . Essential hypertension   . Hyperlipidemia     Current Outpatient Medications:  .  allopurinol (ZYLOPRIM) 100 MG tablet, Take 200 mg by mouth daily as needed (for gout symptoms). , Disp: , Rfl:  .  amiodarone (PACERONE) 200 MG tablet, Take 1 tablet (200 mg total) by mouth daily., Disp: 30 tablet, Rfl: 3 .  apixaban (ELIQUIS) 2.5 MG TABS tablet, Take 1 tablet (2.5 mg total) by mouth 2 (two) times daily. (Patient taking differently: Take 2.5 mg by mouth 2 (two) times daily. Caution - blood thinner), Disp: 90 tablet, Rfl: 0 .  benzonatate (TESSALON) 100 MG capsule, Take 1 capsule (100 mg total) by mouth 3 (three) times daily as needed for cough., Disp: 180 capsule, Rfl: 1 .  clopidogrel (PLAVIX) 75 MG tablet, Take 1 tablet (75 mg total) by mouth daily with breakfast., Disp: 30 tablet, Rfl: 0 .  cyanocobalamin 1000 MCG tablet, Take 1,000 mcg by mouth daily. , Disp: , Rfl:  .  diclofenac sodium (VOLTAREN) 1 % GEL, Apply 4 g topically 4 (four) times daily., Disp: 1 Tube, Rfl: 0 .  Digoxin 62.5 MCG TABS, Take 0.0625 mg by mouth daily., Disp: 30 tablet, Rfl: 5 .  docusate sodium (COLACE) 100 MG capsule, Take 100 mg by mouth 2 (two) times daily.,  Disp: , Rfl:  .  finasteride (PROSCAR) 5 MG tablet, Take 1 tablet (5 mg total) by mouth daily., Disp: 30 tablet, Rfl: 0 .  glipiZIDE (GLUCOTROL) 5 MG tablet, Take 5 mg by mouth daily before breakfast., Disp: , Rfl:  .  hydrALAZINE (APRESOLINE) 25 MG tablet, Take 0.5 tablets (12.5 mg total) by mouth every 8 (eight) hours., Disp: 45 tablet, Rfl: 5 .  HYDROcodone-acetaminophen (NORCO/VICODIN) 5-325 MG tablet, Take 2 tablets by mouth every 4 (four) hours as  needed., Disp: 6 tablet, Rfl: 0 .  isosorbide mononitrate (IMDUR) 30 MG 24 hr tablet, Take 0.5 tablets (15 mg total) by mouth daily., Disp: 15 tablet, Rfl: 5 .  ketoconazole (NIZORAL) 2 % shampoo, Apply 1 application topically 2 (two) times a week., Disp: , Rfl:  .  magnesium oxide (MAG-OX) 400 MG tablet, Take 400 mg by mouth daily., Disp: , Rfl:  .  metoprolol (TOPROL-XL) 200 MG 24 hr tablet, Take 100 mg by mouth daily., Disp: , Rfl:  .  potassium chloride SA (K-DUR,KLOR-CON) 20 MEQ tablet, Take 20 mEq by mouth daily., Disp: , Rfl:  .  rosuvastatin (CRESTOR) 10 MG tablet, Take 1 tablet (10 mg total) by mouth daily at 6 PM., Disp: 30 tablet, Rfl: 5 .  senna-docusate (SENOKOT S) 8.6-50 MG tablet, Take 1 tablet by mouth 2 (two) times daily., Disp: 60 tablet, Rfl: 0 .  sertraline (ZOLOFT) 25 MG tablet, Take 25 mg by mouth daily. , Disp: , Rfl:  .  spironolactone (ALDACTONE) 25 MG tablet, Take 0.5 tablets (12.5 mg total) by mouth daily., Disp: 30 tablet, Rfl: 6 .  torsemide (DEMADEX) 20 MG tablet, Take 2 tablets (40 mg total) by mouth 2 (two) times daily., Disp: 60 tablet, Rfl: 3 .  aspirin EC 81 MG tablet, Take 81 mg by mouth daily., Disp: , Rfl:  .  fluticasone furoate-vilanterol (BREO ELLIPTA) 100-25 MCG/INH AEPB, Inhale 1 puff into the lungs daily. (Patient not taking: Reported on 11/27/2018), Disp: 60 each, Rfl: 3 .  ipratropium-albuterol (DUONEB) 0.5-2.5 (3) MG/3ML SOLN, Take 3 mLs by nebulization every 4 (four) hours as needed. (Patient not taking: Reported on 01/09/2019), Disp: 360 mL, Rfl: 0 .  nitroGLYCERIN (NITROSTAT) 0.4 MG SL tablet, Place 1 tablet (0.4 mg total) under the tongue every 5 (five) minutes x 3 doses as needed for chest pain. (Patient not taking: Reported on 01/09/2019), Disp: 25 tablet, Rfl: 0 Allergies  Allergen Reactions  . Lisinopril Swelling    Facial swelling  . Xarelto [Rivaroxaban] Other (See Comments)    "Blood came out of my penis"      Social History    Socioeconomic History  . Marital status: Married    Spouse name: Not on file  . Number of children: Not on file  . Years of education: Not on file  . Highest education level: Not on file  Occupational History  . Occupation: Retired    Comment: Former Patent examiner  . Financial resource strain: Not on file  . Food insecurity:    Worry: Not on file    Inability: Not on file  . Transportation needs:    Medical: Not on file    Non-medical: Not on file  Tobacco Use  . Smoking status: Former Smoker    Packs/day: 1.00    Years: 15.00    Pack years: 15.00    Types: Cigarettes, Cigars  . Smokeless tobacco: Former Neurosurgeon  . Tobacco comment: quit smoking ~ 50 yr ago  Substance and Sexual Activity  . Alcohol use: No    Alcohol/week: 2.0 standard drinks    Types: 2 Cans of beer per week    Comment: rare beer.  . Drug use: No  . Sexual activity: Yes  Lifestyle  . Physical activity:    Days per week: Not on file    Minutes per session: Not on file  . Stress: Not on file  Relationships  . Social connections:    Talks on phone: Not on file    Gets together: Not on file    Attends religious service: Not on file    Active member of club or organization: Not on file    Attends meetings of clubs or organizations: Not on file    Relationship status: Not on file  . Intimate partner violence:    Fear of current or ex partner: No    Emotionally abused: No    Physically abused: No    Forced sexual activity: No  Other Topics Concern  . Not on file  Social History Narrative   Lives in [place] with wife. Moved from [place] (2013). Does not routinely exercise.    Physical Exam      Future Appointments  Date Time Provider Department Center  01/24/2019  3:15 PM Felecia Shelling, [place] [facility]  01/30/2019 11:00 AM [facility] PA/NP [facility] None  03/06/2019 11:00 AM [facility] ECHO 1-BUZZ [facility]  03/06/2019 12:00 PM Laurey Morale, MD [facility] None    BP 114/62   Pulse 72    Resp 15   Wt 210 lb (95.3 kg)   SpO2 97%   BMI 32.89 kg/m   Weight yesterday-208 Last visit weight-216 @ clinic   First home visit with pt, pt lives home with his wife. He goes to [place] for PCP care and also gets most of his meds there. He denies any issues getting meds from there.  Pt reports he is doing great since he has been going to HF clinic. He normally does his own pill box however there were tablets in several different spots in pill box and unorganized. He did allow me to fill pill box today and review meds with him.  He denies any increased sob, no more dizziness since last week, no c/p. No swelling. He weighs several times a day--I advised him only once is necessary. He has home aide that comes out mon/wed/fri. No bleeding issues on eliquis.   [name], EMT-Paramedic [phone] [facility] Paramedic  01/18/19

## 2019-01-19 ENCOUNTER — Other Ambulatory Visit (HOSPITAL_COMMUNITY): Payer: Non-veteran care

## 2019-01-24 ENCOUNTER — Ambulatory Visit (INDEPENDENT_AMBULATORY_CARE_PROVIDER_SITE_OTHER): Payer: Medicare Other | Admitting: Podiatry

## 2019-01-24 DIAGNOSIS — B351 Tinea unguium: Secondary | ICD-10-CM | POA: Diagnosis not present

## 2019-01-24 DIAGNOSIS — E0843 Diabetes mellitus due to underlying condition with diabetic autonomic (poly)neuropathy: Secondary | ICD-10-CM

## 2019-01-24 DIAGNOSIS — M79676 Pain in unspecified toe(s): Secondary | ICD-10-CM

## 2019-01-28 NOTE — Progress Notes (Signed)
   SUBJECTIVE Patient with a history of diabetes mellitus presents to office today complaining of elongated, thickened nails that cause pain while ambulating in shoes. He is unable to trim his own nails. Patient is here for further evaluation and treatment.   Past Medical History:  Diagnosis Date  . Atrial fibrillation (HCC)   . Cardiomyopathy (HCC)   . Cat scratch fever    removed mass on right side neck  . Cataracts, bilateral   . CHF (congestive heart failure) (HCC)   . Chronic kidney disease   . Corns and callosities   . Diabetes mellitus without complication (HCC)    TYPE 2  . Dyspnea   . Erythema intertrigo   . Flat foot   . Gout   . High cholesterol   . Hx of urethral stricture   . Hypertension   . Kidney stones   . Morbid obesity (HCC)   . Nonischemic cardiomyopathy (HCC)    a. ? 2009 Cath in [place] - nl cors per pt;  b. 09/2013 Echo: EF 25-30%, sev diff HK.  . Obesity   . Osteoarthritis   . PAF (paroxysmal atrial fibrillation) (HCC)    a. post-op hip in 2014 - prev on xarelto.  . Renal insufficiency     OBJECTIVE General Patient is awake, alert, and oriented x 3 and in no acute distress. Derm Skin is dry and supple bilateral. Negative open lesions or macerations. Remaining integument unremarkable. Nails are tender, long, thickened and dystrophic with subungual debris, consistent with onychomycosis, 1-5 bilateral. No signs of infection noted. Vasc  DP and PT pedal pulses palpable bilaterally. Temperature gradient within normal limits.  Neuro Epicritic and protective threshold sensation diminished bilaterally.  Musculoskeletal Exam No symptomatic pedal deformities noted bilateral. Muscular strength within normal limits.  ASSESSMENT 1. Diabetes Mellitus w/ peripheral neuropathy 2. Onychomycosis of nail due to dermatophyte bilateral 3. Pain in foot bilateral  PLAN OF CARE 1. Patient evaluated today. 2. Instructed to maintain good pedal hygiene and foot care. Stressed  importance of controlling blood sugar.  3. Mechanical debridement of nails 1-5 bilaterally performed using a nail nipper. Filed with dremel without incident.  4. Return to clinic in 3 mos.     Felecia Shelling, DPM [facility]  Dr. Felecia Shelling, DPM    [address]                                        [place], [place] [address]                Office [phone]  Fax [phone]

## 2019-01-29 ENCOUNTER — Ambulatory Visit: Payer: Non-veteran care | Admitting: Cardiovascular Disease

## 2019-01-29 NOTE — Progress Notes (Signed)
[facility] Note   Referring Physician: PCP: [facility] PCP-Cardiologist: [name], MD   HPI:  Ryan Macdonald is a 85 y.o. male with a history ofsystolic HF due toNICM, Biotronic ICD, A Fibon Eliquis, DMII, HTN,CAD s/p pLAD stent in 07/2018 with residual non-obstructive disease,chronic hypoxic respiratory failure on home O2 PRN,and depression.  Admitted 12/26 - 11/28/18 with A/C systolic CHF. Diuresed 21 lbs and meds adjusted as tolerated. Echo showed EF 20-25%. EP was consulted for RV pacing and ICD settings were adjusted.  He presents today for regular follow up. Last visit torsemide changed as he had been taking both torsemide and lasix. He is doing well currently. Stable on taking just torsemide. He is getting around better. Denies lightheadedness or dizziness. He denies CP or palpitations. SOB improving getting around the house. Denies ICD shocks. Wife is present. They are about to have their 57th anniversary.   Review of systems complete and found to be negative unless listed in HPI.    Past Medical History 1. A/C Systolic Heart Failure. Due to NICM. EF has been low since at least 2014. -He has single chamber Biotronik ICD. Narrow QRS.Settings adjusted to prevent RV pacing. -Echo12/27/19: EF 20-25%, grade 3 DD, trivial AI, mod MR, LA mild to moderately dilated, RV moderately reduced, RA mildly dilated, PA peak pressure 39 mm Hg -Hold BB with low BP and long PR interval -No ACEI or ARNI with allergy to lisinopril.Hold off on ARB with CKD. - Consider cardiomems. 2. CAD - S/p PCI 07/2018 to pLAD - Continue plavix x 6 months(untilMarch 2020)+ apixiban 2.5 mg BID. 3.ParoxysmalA fib -On eliquis2.5mg  twice a day (age >51, creatinine >1.5 generally). If creatinine remains <1.5, could consider increasing Eliquis to 5 mg BID 4. RV pacing - He had an ICD program change in 05/2018 and sounds like he has been RV pacing since  then. - Biotronic ICD reprogrammed12/27/19 by EPto prevent RV pacing and he had no further pacing. 5. CKD - Creatinine baseline seems to be 1.6-1.8, but lots of variation 6. Hypomagnesium  -Resolved with supplementation.   Past Medical History:  Diagnosis Date  . Atrial fibrillation (HCC)   . Cardiomyopathy (HCC)   . Cat scratch fever    removed mass on right side neck  . Cataracts, bilateral   . CHF (congestive heart failure) (HCC)   . Chronic kidney disease   . Corns and callosities   . Diabetes mellitus without complication (HCC)    TYPE 2  . Dyspnea   . Erythema intertrigo   . Flat foot   . Gout   . High cholesterol   . Hx of urethral stricture   . Hypertension   . Kidney stones   . Morbid obesity (HCC)   . Nonischemic cardiomyopathy (HCC)    a. ? 2009 Cath in [place] - nl cors per pt;  b. 09/2013 Echo: EF 25-30%, sev diff HK.  . Obesity   . Osteoarthritis   . PAF (paroxysmal atrial fibrillation) (HCC)    a. post-op hip in 2014 - prev on xarelto.  . Renal insufficiency    Current Outpatient Medications  Medication Sig Dispense Refill  . allopurinol (ZYLOPRIM) 100 MG tablet Take 200 mg by mouth daily as needed (for gout symptoms).     Marland Kitchen amiodarone (PACERONE) 200 MG tablet Take 1 tablet (200 mg total) by mouth daily. 30 tablet 3  . apixaban (ELIQUIS) 2.5 MG TABS tablet Take 1 tablet (2.5 mg total) by mouth  2 (two) times daily. (Patient taking differently: Take 2.5 mg by mouth 2 (two) times daily. Caution - blood thinner) 90 tablet 0  . aspirin EC 81 MG tablet Take 81 mg by mouth daily.    . benzonatate (TESSALON) 100 MG capsule Take 1 capsule (100 mg total) by mouth 3 (three) times daily as needed for cough. 180 capsule 1  . clopidogrel (PLAVIX) 75 MG tablet Take 1 tablet (75 mg total) by mouth daily with breakfast. 30 tablet 0  . cyanocobalamin 1000 MCG tablet Take 1,000 mcg by mouth daily.     . diclofenac sodium (VOLTAREN) 1 % GEL Apply 4 g topically 4 (four) times  daily. 1 Tube 0  . digoxin (LANOXIN) 0.125 MG tablet TK 1/2 T PO ONCE D    . Digoxin 62.5 MCG TABS Take 0.0625 mg by mouth daily. 30 tablet 5  . docusate sodium (COLACE) 100 MG capsule Take 100 mg by mouth 2 (two) times daily.    . finasteride (PROSCAR) 5 MG tablet Take 1 tablet (5 mg total) by mouth daily. 30 tablet 0  . fluticasone furoate-vilanterol (BREO ELLIPTA) 100-25 MCG/INH AEPB Inhale 1 puff into the lungs daily. 60 each 3  . glipiZIDE (GLUCOTROL) 5 MG tablet Take 5 mg by mouth daily before breakfast.    . hydrALAZINE (APRESOLINE) 25 MG tablet Take 0.5 tablets (12.5 mg total) by mouth every 8 (eight) hours. 45 tablet 5  . HYDROcodone-acetaminophen (NORCO/VICODIN) 5-325 MG tablet Take 2 tablets by mouth every 4 (four) hours as needed. 6 tablet 0  . ipratropium-albuterol (DUONEB) 0.5-2.5 (3) MG/3ML SOLN Take 3 mLs by nebulization every 4 (four) hours as needed. 360 mL 0  . isosorbide mononitrate (IMDUR) 30 MG 24 hr tablet Take 0.5 tablets (15 mg total) by mouth daily. 15 tablet 5  . ketoconazole (NIZORAL) 2 % shampoo Apply 1 application topically 2 (two) times a week.    . magnesium oxide (MAG-OX) 400 MG tablet Take 400 mg by mouth daily.    . metoprolol (TOPROL-XL) 200 MG 24 hr tablet Take 100 mg by mouth daily.    . nitroGLYCERIN (NITROSTAT) 0.4 MG SL tablet Place 1 tablet (0.4 mg total) under the tongue every 5 (five) minutes x 3 doses as needed for chest pain. 25 tablet 0  . potassium chloride SA (K-DUR,KLOR-CON) 20 MEQ tablet Take 20 mEq by mouth daily.    . rosuvastatin (CRESTOR) 10 MG tablet Take 1 tablet (10 mg total) by mouth daily at 6 PM. 30 tablet 5  . senna-docusate (SENOKOT S) 8.6-50 MG tablet Take 1 tablet by mouth 2 (two) times daily. 60 tablet 0  . sertraline (ZOLOFT) 25 MG tablet Take 25 mg by mouth daily.     Marland Kitchen spironolactone (ALDACTONE) 25 MG tablet Take 0.5 tablets (12.5 mg total) by mouth daily. 30 tablet 6  . torsemide (DEMADEX) 20 MG tablet Take 2 tablets (40 mg  total) by mouth 2 (two) times daily. 60 tablet 3   No current facility-administered medications for this visit.    Allergies  Allergen Reactions  . Lisinopril Swelling    Facial swelling  . Xarelto [Rivaroxaban] Other (See Comments)    "Blood came out of my penis"    Social History   Socioeconomic History  . Marital status: Married    Spouse name: Not on file  . Number of children: Not on file  . Years of education: Not on file  . Highest education level: Not on file  Occupational  History  . Occupation: Retired    Comment: Former Patent examiner  . Financial resource strain: Not on file  . Food insecurity:    Worry: Not on file    Inability: Not on file  . Transportation needs:    Medical: Not on file    Non-medical: Not on file  Tobacco Use  . Smoking status: Former Smoker    Packs/day: 1.00    Years: 15.00    Pack years: 15.00    Types: Cigarettes, Cigars  . Smokeless tobacco: Former Neurosurgeon  . Tobacco comment: quit smoking ~ 50 yr ago  Substance and Sexual Activity  . Alcohol use: No    Alcohol/week: 2.0 standard drinks    Types: 2 Cans of beer per week    Comment: rare beer.  . Drug use: No  . Sexual activity: Yes  Lifestyle  . Physical activity:    Days per week: Not on file    Minutes per session: Not on file  . Stress: Not on file  Relationships  . Social connections:    Talks on phone: Not on file    Gets together: Not on file    Attends religious service: Not on file    Active member of club or organization: Not on file    Attends meetings of clubs or organizations: Not on file    Relationship status: Not on file  . Intimate partner violence:    Fear of current or ex partner: No    Emotionally abused: No    Physically abused: No    Forced sexual activity: No  Other Topics Concern  . Not on file  Social History Narrative   Lives in [place] with wife. Moved from [place] (2013). Does not routinely exercise.   Family History  Problem Relation  Age of Onset  . Heart disease Mother   . Heart Problems Maternal Grandmother   . Other Other        negative for premature CAD   Vitals:   01/30/19 1102  BP: 120/68  Pulse: (!) 54  SpO2: 93%  Weight: 98.2 kg (216 lb 6.4 oz)    Wt Readings from Last 3 Encounters:  01/18/19 95.3 kg (210 lb)  01/09/19 98.2 kg (216 lb 9.6 oz)  12/12/18 98.7 kg (217 lb 9.6 oz)    PHYSICAL EXAM: General: NAD HEENT: Normal Neck: Supple. JVP 6-7 cm. Carotids 2+ bilat; no bruits. No thyromegaly or nodule noted. Cor: PMI nondisplaced. RRR, No M/G/R noted Lungs: CTAB, normal effort. Abdomen: Soft, non-tender, non-distended, no HSM. No bruits or masses. +BS  Extremities: No cyanosis, clubbing, or rash. R and LLE no edema.  Neuro: Alert & orientedx3, cranial nerves grossly intact. moves all 4 extremities w/o difficulty. Affect pleasant   ASSESSMENT & PLAN:  1. Chronic systolic Heart Failure. Due to NICM. EF has been low since at least 2014. -He has single chamber Biotronik ICD. Narrow QRS.Settings adjusted to prevent RV pacing. -Echo12/27/19: EF 20-25%, grade 3 DD, trivial AI, mod MR, LA mild to moderately dilated, RV moderately reduced, RA mildly dilated, PA peak pressure 39 mm Hg - NYHA II-III symptoms chronically, confounded by deconditioning.  - Volume status OK on exam.   - Continue torsemide 40 mg BID.  - Continue spiro 12.5 mg qhs. It is unclear if he ever went up.  - Continue hydral 12.5 mg TID + imdur 15 mg daily. Bidil not covered by [facility]. -Continuedigoxin 0.0625 mg daily.  -Hold BB with  low BP and long PR interval -No ACEI or ARNI with allergy to lisinopril.Hold off on ARB with CKD. - Consider cardiomems.  2. CAD - S/p PCI 07/2018 to pLAD - No s/s of ischemia.    - Continue plavix x 6 months(untilMarch 2020)+ apixiban 2.5 mg BID. - Continue crestor  3.ParoxysmalA fib - Regular on exam.  - Continue amiodarone 200 mg daily - Continue eliquis2.5mg  twice a day (age  >110, creatinine >1.5 generally).  4. RV pacing - He had an ICD program change in July and sounds like he has been RV pacing since then. - Biotronic ICD reprogrammed12/27 by EPto prevent RV pacing and he had no further pacing. - [facility]. Unable to interrogate Biotronic device in clinic. No change.   5. CKD III - Creatinine baseline 1.8 - 2.0 with high degree of lability, but he has been taking his diuretics wrong.  - BMET today.   Doing well overall. Can stop Plavix on 02/17/19. Keep appt for next month with Dr. Shirlee Latch.   Graciella Freer, PA-C 01/29/19   Greater than 50% of the 25 minute visit was spent in counseling/coordination of care regarding disease state education, salt/fluid restriction, sliding scale diuretics, and medication compliance.

## 2019-01-30 ENCOUNTER — Other Ambulatory Visit: Payer: Self-pay

## 2019-01-30 ENCOUNTER — Ambulatory Visit (HOSPITAL_COMMUNITY)
Admission: RE | Admit: 2019-01-30 | Discharge: 2019-01-30 | Disposition: A | Discharge status: Home / Self Care | Payer: Non-veteran care | Source: Ambulatory Visit | Attending: Cardiology | Admitting: Cardiology

## 2019-01-30 ENCOUNTER — Encounter (HOSPITAL_COMMUNITY): Payer: Self-pay

## 2019-01-30 VITALS — BP 120/68 | HR 54 | Wt 216.4 lb

## 2019-01-30 DIAGNOSIS — Z87891 Personal history of nicotine dependence: Secondary | ICD-10-CM | POA: Insufficient documentation

## 2019-01-30 DIAGNOSIS — N183 Chronic kidney disease, stage 3 unspecified: Secondary | ICD-10-CM

## 2019-01-30 DIAGNOSIS — Z8249 Family history of ischemic heart disease and other diseases of the circulatory system: Secondary | ICD-10-CM | POA: Insufficient documentation

## 2019-01-30 DIAGNOSIS — M1990 Unspecified osteoarthritis, unspecified site: Secondary | ICD-10-CM | POA: Diagnosis not present

## 2019-01-30 DIAGNOSIS — I428 Other cardiomyopathies: Secondary | ICD-10-CM

## 2019-01-30 DIAGNOSIS — I480 Paroxysmal atrial fibrillation: Secondary | ICD-10-CM | POA: Diagnosis not present

## 2019-01-30 DIAGNOSIS — I130 Hypertensive heart and chronic kidney disease with heart failure and stage 1 through stage 4 chronic kidney disease, or unspecified chronic kidney disease: Secondary | ICD-10-CM | POA: Diagnosis not present

## 2019-01-30 DIAGNOSIS — E7800 Pure hypercholesterolemia, unspecified: Secondary | ICD-10-CM | POA: Diagnosis not present

## 2019-01-30 DIAGNOSIS — I5022 Chronic systolic (congestive) heart failure: Secondary | ICD-10-CM | POA: Diagnosis present

## 2019-01-30 DIAGNOSIS — Z7901 Long term (current) use of anticoagulants: Secondary | ICD-10-CM | POA: Diagnosis not present

## 2019-01-30 DIAGNOSIS — Z7982 Long term (current) use of aspirin: Secondary | ICD-10-CM | POA: Diagnosis not present

## 2019-01-30 DIAGNOSIS — Z79899 Other long term (current) drug therapy: Secondary | ICD-10-CM | POA: Insufficient documentation

## 2019-01-30 DIAGNOSIS — E1122 Type 2 diabetes mellitus with diabetic chronic kidney disease: Secondary | ICD-10-CM | POA: Insufficient documentation

## 2019-01-30 DIAGNOSIS — I2510 Atherosclerotic heart disease of native coronary artery without angina pectoris: Secondary | ICD-10-CM | POA: Diagnosis not present

## 2019-01-30 DIAGNOSIS — R0602 Shortness of breath: Secondary | ICD-10-CM | POA: Diagnosis not present

## 2019-01-30 DIAGNOSIS — Z7984 Long term (current) use of oral hypoglycemic drugs: Secondary | ICD-10-CM | POA: Diagnosis not present

## 2019-01-30 DIAGNOSIS — M109 Gout, unspecified: Secondary | ICD-10-CM | POA: Insufficient documentation

## 2019-01-30 DIAGNOSIS — Z7902 Long term (current) use of antithrombotics/antiplatelets: Secondary | ICD-10-CM | POA: Diagnosis not present

## 2019-01-30 LAB — BASIC METABOLIC PANEL
Anion gap: 13 (ref 5–15)
BUN: 50 mg/dL — ABNORMAL HIGH (ref 8–23)
CO2: 23 mmol/L (ref 22–32)
Calcium: 8.9 mg/dL (ref 8.9–10.3)
Chloride: 100 mmol/L (ref 98–111)
Creatinine, Ser: 2.05 mg/dL — ABNORMAL HIGH (ref 0.61–1.24)
GFR calc Af Amer: 34 mL/min — ABNORMAL LOW (ref >60)
GFR calc non Af Amer: 29 mL/min — ABNORMAL LOW (ref >60)
Glucose, Bld: 95 mg/dL (ref 70–99)
POTASSIUM: 4 mmol/L (ref 3.5–5.1)
SODIUM: 136 mmol/L (ref 135–145)

## 2019-01-30 LAB — CBC
HCT: 36.6 % — ABNORMAL LOW (ref 39.0–52.0)
Hemoglobin: 11.7 g/dL — ABNORMAL LOW (ref 13.0–17.0)
MCH: 28.9 pg (ref 26.0–34.0)
MCHC: 32 g/dL (ref 30.0–36.0)
MCV: 90.4 fL (ref 80.0–100.0)
Platelets: 255 10*3/uL (ref 150–400)
RBC: 4.05 MIL/uL — AB (ref 4.22–5.81)
RDW: 17.2 % — ABNORMAL HIGH (ref 11.5–15.5)
WBC: 5.9 10*3/uL (ref 4.0–10.5)
nRBC: 0 % (ref 0.0–0.2)

## 2019-01-30 NOTE — Patient Instructions (Addendum)
Lab work done today. We will notify you of any abnormal lab work.  No medication changes today.  Please keep follow up appointment with Dr. Shirlee Latch April 7th at 12:00p

## 2019-02-07 ENCOUNTER — Other Ambulatory Visit (HOSPITAL_COMMUNITY): Payer: Self-pay

## 2019-02-07 NOTE — Progress Notes (Signed)
Paramedicine Encounter    Patient ID: Ryan Macdonald, male    DOB: 18-Apr-1934, 85 y.o.   MRN: [id number]   Patient Care Team: [facility] as PCP - General (General Practice) [name], MD as PCP - Cardiology (Cardiology)  Patient Active Problem List   Diagnosis Date Noted  . Acute exacerbation of CHF (congestive heart failure) (HCC) 11/23/2018  . Acute on chronic heart failure (HCC) 10/19/2018  . Pulmonary emphysema (HCC)   . Chronic respiratory failure with hypoxia (HCC)   . Morbid obesity (HCC) 09/09/2018  . URI (upper respiratory infection) 08/31/2018  . UTI (urinary tract infection) 08/31/2018  . Acute on chronic systolic (congestive) heart failure (HCC) 08/28/2018  . CHF (congestive heart failure) (HCC) 08/14/2018  . Acute on chronic combined systolic and diastolic CHF (congestive heart failure) (HCC) 07/18/2018  . First degree AV block   . Shortness of breath 04/29/2018  . Acute HFrEF (heart failure with reduced ejection fraction) (HCC) 04/29/2018  . Acute respiratory failure with hypoxia (HCC) 09/27/2017  . CHF exacerbation (HCC) 09/27/2017  . Renal insufficiency   . Acute on chronic systolic CHF (congestive heart failure) (HCC) 07/08/2017  . Left knee pain 07/08/2017  . Acute bronchitis 05/10/2017  . Acute congestive heart failure (HCC) 04/14/2017  . Elevated troponin 04/14/2017  . Lactic acidosis   . Acute on chronic systolic heart failure, NYHA class 2 (HCC) 02/03/2017  . Diabetes mellitus type 2 in obese (HCC) 02/03/2017  . CAP (community acquired pneumonia) 02/03/2017  . Lobar pneumonia (HCC)   . Diabetes mellitus with complication (HCC)   . Acute on chronic respiratory failure with hypoxia (HCC)   . Acute on chronic combined systolic and diastolic heart failure (HCC) 05/19/2016  . Depression 05/19/2016  . Gout 05/19/2016  . 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 NSVT (nonsustained ventricular tachycardia) (HCC)    . Chronic renal insufficiency, stage 3 (moderate) (HCC)   . Pulmonary embolism (HCC)   . Acute respiratory failure (HCC)   . Osteoarthritis of left hip 10/25/2013  . Atrial fibrillation (HCC) 10/25/2013  . S/P hip replacement 10/24/2013  . Nonischemic cardiomyopathy (HCC)   . Obesity (BMI 30-39.9)   . Essential hypertension   . Hyperlipidemia     Current Outpatient Medications:  .  allopurinol (ZYLOPRIM) 100 MG tablet, Take 200 mg by mouth daily as needed (for gout symptoms). , Disp: , Rfl:  .  amiodarone (PACERONE) 200 MG tablet, Take 1 tablet (200 mg total) by mouth daily., Disp: 30 tablet, Rfl: 3 .  apixaban (ELIQUIS) 2.5 MG TABS tablet, Take 1 tablet (2.5 mg total) by mouth 2 (two) times daily. (Patient taking differently: Take 2.5 mg by mouth 2 (two) times daily. Caution - blood thinner), Disp: 90 tablet, Rfl: 0 .  clopidogrel (PLAVIX) 75 MG tablet, Take 1 tablet (75 mg total) by mouth daily with breakfast., Disp: 30 tablet, Rfl: 0 .  cyanocobalamin 1000 MCG tablet, Take 1,000 mcg by mouth daily. , Disp: , Rfl:  .  diclofenac sodium (VOLTAREN) 1 % GEL, Apply 4 g topically 4 (four) times daily., Disp: 1 Tube, Rfl: 0 .  Digoxin 62.5 MCG TABS, Take 0.0625 mg by mouth daily., Disp: 30 tablet, Rfl: 5 .  docusate sodium (COLACE) 100 MG capsule, Take 100 mg by mouth 2 (two) times daily., Disp: , Rfl:  .  finasteride (PROSCAR) 5 MG tablet, Take 1 tablet (5 mg total) by mouth daily., Disp: 30 tablet, Rfl: 0 .  fluticasone  furoate-vilanterol (BREO ELLIPTA) 100-25 MCG/INH AEPB, Inhale 1 puff into the lungs daily., Disp: 60 each, Rfl: 3 .  glipiZIDE (GLUCOTROL) 5 MG tablet, Take 5 mg by mouth daily before breakfast., Disp: , Rfl:  .  hydrALAZINE (APRESOLINE) 25 MG tablet, Take 0.5 tablets (12.5 mg total) by mouth every 8 (eight) hours., Disp: 45 tablet, Rfl: 5 .  HYDROcodone-acetaminophen (NORCO/VICODIN) 5-325 MG tablet, Take 2 tablets by mouth every 4 (four) hours as needed., Disp: 6 tablet,  Rfl: 0 .  ipratropium-albuterol (DUONEB) 0.5-2.5 (3) MG/3ML SOLN, Take 3 mLs by nebulization every 4 (four) hours as needed., Disp: 360 mL, Rfl: 0 .  isosorbide mononitrate (IMDUR) 30 MG 24 hr tablet, Take 0.5 tablets (15 mg total) by mouth daily., Disp: 15 tablet, Rfl: 5 .  ketoconazole (NIZORAL) 2 % shampoo, Apply 1 application topically 2 (two) times a week., Disp: , Rfl:  .  magnesium oxide (MAG-OX) 400 MG tablet, Take 400 mg by mouth daily., Disp: , Rfl:  .  metoprolol (TOPROL-XL) 200 MG 24 hr tablet, Take 100 mg by mouth daily., Disp: , Rfl:  .  potassium chloride SA (K-DUR,KLOR-CON) 20 MEQ tablet, Take 20 mEq by mouth daily., Disp: , Rfl:  .  rosuvastatin (CRESTOR) 10 MG tablet, Take 1 tablet (10 mg total) by mouth daily at 6 PM., Disp: 30 tablet, Rfl: 5 .  spironolactone (ALDACTONE) 25 MG tablet, Take 0.5 tablets (12.5 mg total) by mouth daily., Disp: 30 tablet, Rfl: 6 .  torsemide (DEMADEX) 20 MG tablet, Take 2 tablets (40 mg total) by mouth 2 (two) times daily., Disp: 60 tablet, Rfl: 3 .  aspirin EC 81 MG tablet, Take 81 mg by mouth daily., Disp: , Rfl:  .  benzonatate (TESSALON) 100 MG capsule, Take 1 capsule (100 mg total) by mouth 3 (three) times daily as needed for cough. (Patient not taking: Reported on 02/07/2019), Disp: 180 capsule, Rfl: 1 .  digoxin (LANOXIN) 0.125 MG tablet, TK 1/2 T PO ONCE D, Disp: , Rfl:  .  nitroGLYCERIN (NITROSTAT) 0.4 MG SL tablet, Place 1 tablet (0.4 mg total) under the tongue every 5 (five) minutes x 3 doses as needed for chest pain. (Patient not taking: Reported on 02/07/2019), Disp: 25 tablet, Rfl: 0 .  senna-docusate (SENOKOT S) 8.6-50 MG tablet, Take 1 tablet by mouth 2 (two) times daily. (Patient not taking: Reported on 02/07/2019), Disp: 60 tablet, Rfl: 0 .  sertraline (ZOLOFT) 25 MG tablet, Take 25 mg by mouth daily. , Disp: , Rfl:  Allergies  Allergen Reactions  . Lisinopril Swelling    Facial swelling  . Xarelto [Rivaroxaban] Other (See Comments)     "Blood came out of my penis"      Social History   Socioeconomic History  . Marital status: Married    Spouse name: Not on file  . Number of children: Not on file  . Years of education: Not on file  . Highest education level: Not on file  Occupational History  . Occupation: Retired    Comment: Former Patent examiner  . Financial resource strain: Not on file  . Food insecurity:    Worry: Not on file    Inability: Not on file  . Transportation needs:    Medical: Not on file    Non-medical: Not on file  Tobacco Use  . Smoking status: Former Smoker    Packs/day: 1.00    Years: 15.00    Pack years: 15.00    Types: Cigarettes,  Cigars  . Smokeless tobacco: Former Neurosurgeon  . Tobacco comment: quit smoking ~ 50 yr ago  Substance and Sexual Activity  . Alcohol use: No    Alcohol/week: 2.0 standard drinks    Types: 2 Cans of beer per week    Comment: rare beer.  . Drug use: No  . Sexual activity: Yes  Lifestyle  . Physical activity:    Days per week: Not on file    Minutes per session: Not on file  . Stress: Not on file  Relationships  . Social connections:    Talks on phone: Not on file    Gets together: Not on file    Attends religious service: Not on file    Active member of club or organization: Not on file    Attends meetings of clubs or organizations: Not on file    Relationship status: Not on file  . Intimate partner violence:    Fear of current or ex partner: No    Emotionally abused: No    Physically abused: No    Forced sexual activity: No  Other Topics Concern  . Not on file  Social History Narrative   Lives in [place] with wife. Moved from [place] (2013). Does not routinely exercise.    Physical Exam      Future Appointments  Date Time Provider Department Center  03/06/2019 11:00 AM [facility] ECHO 1-BU[facility]  03/06/2019 12:00 PM Laurey Morale, MD [facility] None  04/25/2019  3:45 PM Logan Bores, Larena Glassman, DPM [facility]    BP 118/64    Pulse (!) 50   Resp 15   Wt 200 lb (90.7 kg)   SpO2 98%   BMI 31.32 kg/m   Weight yesterday-200 Last visit weight-216 @ clinic  210 @ home   Pt reports he is doing well, according to clinic notes last week he can stop the plavix on 3/21. That was reminded of him as well.  Pt denies any increased sob, no c/p, no dizziness.  Pt had filled up some of his pill box but did not complete it.  Verified meds and filled up 2 pill boxes for him.  He reports he was taken off asa.  plavix placed in pill box accordingly to the stop date.  He did not have the zoloft bottle.  We ordered numerous meds from the [place] for him--isosorbide, mag, hydralzine, B12, finasteride, glipizide, potassium. Crestor, spiro,  And he called in hydrocodone earlier today.  He does have tad bit of edema to his feet.  He reported his weight down 10lbs from 2wks ago, not sure if he remembered correctly or not???  His HR was 50 today, when I was going thru his pill box of which he had done, he had whole tabs and half tabs in the days so he has been taking it incorrectly. It was all corrected in his pill box and I let clinic know as well. He had no complaints today and was very thankful for the visit.   [name], EMT-Paramedic [phone] [facility] Paramedic  02/07/19

## 2019-02-20 ENCOUNTER — Telehealth (HOSPITAL_COMMUNITY): Payer: Self-pay | Admitting: Licensed Clinical Social Worker

## 2019-02-20 NOTE — Telephone Encounter (Signed)
CSW reached out to pt to check in regarding food and medication status at this time. No concerns at this time.  CSW encouraged pt to reach out with any concerns and will continue to follow and assist as needed  Burna Sis, LCSW Clinical Social Worker [facility] [phone]

## 2019-02-21 ENCOUNTER — Other Ambulatory Visit (HOSPITAL_COMMUNITY): Payer: Self-pay

## 2019-02-21 NOTE — Progress Notes (Signed)
Paramedicine Encounter    Patient ID: Ryan Macdonald, male    DOB: January 23, 1934, 85 y.o.   MRN: [id number]   Patient Care Team: [facility], [facility] as PCP - General (General Practice) [name], MD as PCP - Cardiology (Cardiology)  Patient Active Problem List   Diagnosis Date Noted  . Acute exacerbation of CHF (congestive heart failure) (HCC) 11/23/2018  . Acute on chronic heart failure (HCC) 10/19/2018  . Pulmonary emphysema (HCC)   . Chronic respiratory failure with hypoxia (HCC)   . Morbid obesity (HCC) 09/09/2018  . URI (upper respiratory infection) 08/31/2018  . UTI (urinary tract infection) 08/31/2018  . Acute on chronic systolic (congestive) heart failure (HCC) 08/28/2018  . CHF (congestive heart failure) (HCC) 08/14/2018  . Acute on chronic combined systolic and diastolic CHF (congestive heart failure) (HCC) 07/18/2018  . First degree AV block   . Shortness of breath 04/29/2018  . Acute HFrEF (heart failure with reduced ejection fraction) (HCC) 04/29/2018  . Acute respiratory failure with hypoxia (HCC) 09/27/2017  . CHF exacerbation (HCC) 09/27/2017  . Renal insufficiency   . Acute on chronic systolic CHF (congestive heart failure) (HCC) 07/08/2017  . Left knee pain 07/08/2017  . Acute bronchitis 05/10/2017  . Acute congestive heart failure (HCC) 04/14/2017  . Elevated troponin 04/14/2017  . Lactic acidosis   . Acute on chronic systolic heart failure, NYHA class 2 (HCC) 02/03/2017  . Diabetes mellitus type 2 in obese (HCC) 02/03/2017  . CAP (community acquired pneumonia) 02/03/2017  . Lobar pneumonia (HCC)   . Diabetes mellitus with complication (HCC)   . Acute on chronic respiratory failure with hypoxia (HCC)   . Acute on chronic combined systolic and diastolic heart failure (HCC) 05/19/2016  . Depression 05/19/2016  . Gout 05/19/2016  . 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 NSVT (nonsustained ventricular tachycardia) (HCC)    . Chronic renal insufficiency, stage 3 (moderate) (HCC)   . Pulmonary embolism (HCC)   . Acute respiratory failure (HCC)   . Osteoarthritis of left hip 10/25/2013  . Atrial fibrillation (HCC) 10/25/2013  . S/P hip replacement 10/24/2013  . Nonischemic cardiomyopathy (HCC)   . Obesity (BMI 30-39.9)   . Essential hypertension   . Hyperlipidemia     Current Outpatient Medications:  .  allopurinol (ZYLOPRIM) 100 MG tablet, Take 200 mg by mouth daily as needed (for gout symptoms). , Disp: , Rfl:  .  amiodarone (PACERONE) 200 MG tablet, Take 1 tablet (200 mg total) by mouth daily., Disp: 30 tablet, Rfl: 3 .  apixaban (ELIQUIS) 2.5 MG TABS tablet, Take 1 tablet (2.5 mg total) by mouth 2 (two) times daily. (Patient taking differently: Take 2.5 mg by mouth 2 (two) times daily. Caution - blood thinner), Disp: 90 tablet, Rfl: 0 .  cyanocobalamin 1000 MCG tablet, Take 1,000 mcg by mouth daily. , Disp: , Rfl:  .  diclofenac sodium (VOLTAREN) 1 % GEL, Apply 4 g topically 4 (four) times daily., Disp: 1 Tube, Rfl: 0 .  digoxin (LANOXIN) 0.125 MG tablet, TK 1/2 T PO ONCE D, Disp: , Rfl:  .  docusate sodium (COLACE) 100 MG capsule, Take 100 mg by mouth 2 (two) times daily., Disp: , Rfl:  .  finasteride (PROSCAR) 5 MG tablet, Take 1 tablet (5 mg total) by mouth daily., Disp: 30 tablet, Rfl: 0 .  fluticasone furoate-vilanterol (BREO ELLIPTA) 100-25 MCG/INH AEPB, Inhale 1 puff into the lungs daily., Disp: 60 each, Rfl: 3 .  glipiZIDE (GLUCOTROL) 5  MG tablet, Take 5 mg by mouth daily before breakfast., Disp: , Rfl:  .  hydrALAZINE (APRESOLINE) 25 MG tablet, Take 0.5 tablets (12.5 mg total) by mouth every 8 (eight) hours., Disp: 45 tablet, Rfl: 5 .  HYDROcodone-acetaminophen (NORCO/VICODIN) 5-325 MG tablet, Take 2 tablets by mouth every 4 (four) hours as needed., Disp: 6 tablet, Rfl: 0 .  ipratropium-albuterol (DUONEB) 0.5-2.5 (3) MG/3ML SOLN, Take 3 mLs by nebulization every 4 (four) hours as needed., Disp: 360  mL, Rfl: 0 .  isosorbide mononitrate (IMDUR) 30 MG 24 hr tablet, Take 0.5 tablets (15 mg total) by mouth daily., Disp: 15 tablet, Rfl: 5 .  ketoconazole (NIZORAL) 2 % shampoo, Apply 1 application topically 2 (two) times a week., Disp: , Rfl:  .  magnesium oxide (MAG-OX) 400 MG tablet, Take 400 mg by mouth daily., Disp: , Rfl:  .  metoprolol (TOPROL-XL) 200 MG 24 hr tablet, Take 100 mg by mouth daily., Disp: , Rfl:  .  potassium chloride SA (K-DUR,KLOR-CON) 20 MEQ tablet, Take 20 mEq by mouth daily., Disp: , Rfl:  .  rosuvastatin (CRESTOR) 10 MG tablet, Take 1 tablet (10 mg total) by mouth daily at 6 PM., Disp: 30 tablet, Rfl: 5 .  sertraline (ZOLOFT) 25 MG tablet, Take 25 mg by mouth daily. , Disp: , Rfl:  .  spironolactone (ALDACTONE) 25 MG tablet, Take 0.5 tablets (12.5 mg total) by mouth daily., Disp: 30 tablet, Rfl: 6 .  torsemide (DEMADEX) 20 MG tablet, Take 2 tablets (40 mg total) by mouth 2 (two) times daily., Disp: 60 tablet, Rfl: 3 .  aspirin EC 81 MG tablet, Take 81 mg by mouth daily. , Disp: , Rfl:  .  benzonatate (TESSALON) 100 MG capsule, Take 1 capsule (100 mg total) by mouth 3 (three) times daily as needed for cough. (Patient not taking: Reported on 02/07/2019), Disp: 180 capsule, Rfl: 1 .  clopidogrel (PLAVIX) 75 MG tablet, Take 1 tablet (75 mg total) by mouth daily with breakfast. (Patient not taking: Reported on 02/21/2019), Disp: 30 tablet, Rfl: 0 .  Digoxin 62.5 MCG TABS, Take 0.0625 mg by mouth daily. (Patient not taking: Reported on 02/21/2019), Disp: 30 tablet, Rfl: 5 .  nitroGLYCERIN (NITROSTAT) 0.4 MG SL tablet, Place 1 tablet (0.4 mg total) under the tongue every 5 (five) minutes x 3 doses as needed for chest pain. (Patient not taking: Reported on 02/07/2019), Disp: 25 tablet, Rfl: 0 .  senna-docusate (SENOKOT S) 8.6-50 MG tablet, Take 1 tablet by mouth 2 (two) times daily. (Patient not taking: Reported on 02/07/2019), Disp: 60 tablet, Rfl: 0 Allergies  Allergen Reactions  .  Lisinopril Swelling    Facial swelling  . Xarelto [Rivaroxaban] Other (See Comments)    "Blood came out of my penis"      Social History   Socioeconomic History  . Marital status: Married    Spouse name: Not on file  . Number of children: Not on file  . Years of education: Not on file  . Highest education level: Not on file  Occupational History  . Occupation: Retired    Comment: Former Patent examiner  . Financial resource strain: Not on file  . Food insecurity:    Worry: Not on file    Inability: Not on file  . Transportation needs:    Medical: Not on file    Non-medical: Not on file  Tobacco Use  . Smoking status: Former Smoker    Packs/day: 1.00  Years: 15.00    Pack years: 15.00    Types: Cigarettes, Cigars  . Smokeless tobacco: Former Neurosurgeon  . Tobacco comment: quit smoking ~ 50 yr ago  Substance and Sexual Activity  . Alcohol use: No    Alcohol/week: 2.0 standard drinks    Types: 2 Cans of beer per week    Comment: rare beer.  . Drug use: No  . Sexual activity: Yes  Lifestyle  . Physical activity:    Days per week: Not on file    Minutes per session: Not on file  . Stress: Not on file  Relationships  . Social connections:    Talks on phone: Not on file    Gets together: Not on file    Attends religious service: Not on file    Active member of club or organization: Not on file    Attends meetings of clubs or organizations: Not on file    Relationship status: Not on file  . Intimate partner violence:    Fear of current or ex partner: No    Emotionally abused: No    Physically abused: No    Forced sexual activity: No  Other Topics Concern  . Not on file  Social History Narrative   Lives in [place] with wife. Moved from [place] (2013). Does not routinely exercise.    Physical Exam      Future Appointments  Date Time Provider Department Center  03/06/2019 11:00 AM [facility] ECHO 1-BU[facility]  03/06/2019 12:00 PM Laurey Morale, MD  [facility] None  04/25/2019  3:45 PM Logan Bores, Larena Glassman, DPM [facility]    BP 118/60   Pulse (!) 50   Resp 15   Wt 200 lb (90.7 kg)   SpO2 98%   BMI 31.32 kg/m   Weight yesterday-201 Last visit weight-200  Pt reprots he is feeling good, he denies increased sob, no chest pains. He states he his energy level is good, appetite is good, no dizziness. He had missed a few doses of his meds this past 2 wks. Amy wants a virtual visit next week. meds verified and pill boxes refilled.   [name], EMT-Paramedic [phone] [facility] Paramedic  02/21/19

## 2019-02-28 DIAGNOSIS — N179 Acute kidney failure, unspecified: Secondary | ICD-10-CM

## 2019-02-28 HISTORY — DX: Acute kidney failure, unspecified: N17.9

## 2019-03-06 ENCOUNTER — Inpatient Hospital Stay (HOSPITAL_COMMUNITY): Admission: RE | Admit: 2019-03-06 | Payer: Non-veteran care | Source: Ambulatory Visit | Admitting: Cardiology

## 2019-03-06 ENCOUNTER — Ambulatory Visit (HOSPITAL_COMMUNITY): Payer: Non-veteran care

## 2019-03-07 ENCOUNTER — Other Ambulatory Visit: Payer: Self-pay

## 2019-03-07 ENCOUNTER — Ambulatory Visit (HOSPITAL_BASED_OUTPATIENT_CLINIC_OR_DEPARTMENT_OTHER)
Admission: RE | Admit: 2019-03-07 | Discharge: 2019-03-07 | Disposition: A | Discharge status: Home / Self Care | Payer: Medicare Other | Source: Ambulatory Visit | Attending: Adult Health | Admitting: Adult Health

## 2019-03-07 ENCOUNTER — Encounter (HOSPITAL_COMMUNITY): Payer: Self-pay

## 2019-03-07 ENCOUNTER — Other Ambulatory Visit (HOSPITAL_COMMUNITY): Payer: Self-pay

## 2019-03-07 VITALS — BP 90/54 | HR 50 | Wt 195.0 lb

## 2019-03-07 DIAGNOSIS — I5022 Chronic systolic (congestive) heart failure: Secondary | ICD-10-CM

## 2019-03-07 DIAGNOSIS — I480 Paroxysmal atrial fibrillation: Secondary | ICD-10-CM

## 2019-03-07 DIAGNOSIS — I428 Other cardiomyopathies: Secondary | ICD-10-CM

## 2019-03-07 DIAGNOSIS — I2510 Atherosclerotic heart disease of native coronary artery without angina pectoris: Secondary | ICD-10-CM

## 2019-03-07 DIAGNOSIS — I5043 Acute on chronic combined systolic (congestive) and diastolic (congestive) heart failure: Secondary | ICD-10-CM

## 2019-03-07 NOTE — Progress Notes (Signed)
Paramedicine Encounter    Patient ID: Ryan Macdonald, male    DOB: Jan 30, 1934, 85 y.o.   MRN: [id number]   Patient Care Team: [facility], [facility] as PCP - General (General Practice) [name], MD as PCP - Cardiology (Cardiology) Laurey Morale, MD as PCP - Advanced Heart Failure (Cardiology)  Patient Active Problem List   Diagnosis Date Noted  . Acute exacerbation of CHF (congestive heart failure) (HCC) 11/23/2018  . Acute on chronic heart failure (HCC) 10/19/2018  . Pulmonary emphysema (HCC)   . Chronic respiratory failure with hypoxia (HCC)   . Morbid obesity (HCC) 09/09/2018  . URI (upper respiratory infection) 08/31/2018  . UTI (urinary tract infection) 08/31/2018  . Acute on chronic systolic (congestive) heart failure (HCC) 08/28/2018  . CHF (congestive heart failure) (HCC) 08/14/2018  . Acute on chronic combined systolic and diastolic CHF (congestive heart failure) (HCC) 07/18/2018  . First degree AV block   . Shortness of breath 04/29/2018  . Acute HFrEF (heart failure with reduced ejection fraction) (HCC) 04/29/2018  . Acute respiratory failure with hypoxia (HCC) 09/27/2017  . CHF exacerbation (HCC) 09/27/2017  . Renal insufficiency   . Acute on chronic systolic CHF (congestive heart failure) (HCC) 07/08/2017  . Left knee pain 07/08/2017  . Acute bronchitis 05/10/2017  . Acute congestive heart failure (HCC) 04/14/2017  . Elevated troponin 04/14/2017  . Lactic acidosis   . Acute on chronic systolic heart failure, NYHA class 2 (HCC) 02/03/2017  . Diabetes mellitus type 2 in obese (HCC) 02/03/2017  . CAP (community acquired pneumonia) 02/03/2017  . Lobar pneumonia (HCC)   . Diabetes mellitus with complication (HCC)   . Acute on chronic respiratory failure with hypoxia (HCC)   . Acute on chronic combined systolic and diastolic heart failure (HCC) 05/19/2016  . Depression 05/19/2016  . Gout 05/19/2016  . ICD (implantable cardioverter-defibrillator) in  place 12/17/2015  . NSVT (nonsustained ventricular tachycardia) (HCC)   . Chronic renal insufficiency, stage 3 (moderate) (HCC)   . Pulmonary embolism (HCC)   . Acute respiratory failure (HCC)   . Osteoarthritis of left hip 10/25/2013  . Atrial fibrillation (HCC) 10/25/2013  . S/P hip replacement 10/24/2013  . Nonischemic cardiomyopathy (HCC)   . Obesity (BMI 30-39.9)   . Essential hypertension   . Hyperlipidemia     Current Outpatient Medications:  .  allopurinol (ZYLOPRIM) 100 MG tablet, Take 200 mg by mouth daily as needed (for gout symptoms). , Disp: , Rfl:  .  amiodarone (PACERONE) 200 MG tablet, Take 1 tablet (200 mg total) by mouth daily., Disp: 30 tablet, Rfl: 3 .  apixaban (ELIQUIS) 2.5 MG TABS tablet, Take 1 tablet (2.5 mg total) by mouth 2 (two) times daily. (Patient taking differently: Take 2.5 mg by mouth 2 (two) times daily. Caution - blood thinner), Disp: 90 tablet, Rfl: 0 .  aspirin EC 81 MG tablet, Take 81 mg by mouth daily. , Disp: , Rfl:  .  cyanocobalamin 1000 MCG tablet, Take 1,000 mcg by mouth daily. , Disp: , Rfl:  .  diclofenac sodium (VOLTAREN) 1 % GEL, Apply 4 g topically 4 (four) times daily., Disp: 1 Tube, Rfl: 0 .  Digoxin 62.5 MCG TABS, Take 0.0625 mg by mouth daily., Disp: 30 tablet, Rfl: 5 .  docusate sodium (COLACE) 100 MG capsule, Take 100 mg by mouth 2 (two) times daily., Disp: , Rfl:  .  finasteride (PROSCAR) 5 MG tablet, Take 1 tablet (5 mg total) by mouth daily., Disp: 30  tablet, Rfl: 0 .  fluticasone furoate-vilanterol (BREO ELLIPTA) 100-25 MCG/INH AEPB, Inhale 1 puff into the lungs daily., Disp: 60 each, Rfl: 3 .  glipiZIDE (GLUCOTROL) 5 MG tablet, Take 5 mg by mouth daily before breakfast., Disp: , Rfl:  .  hydrALAZINE (APRESOLINE) 25 MG tablet, Take 0.5 tablets (12.5 mg total) by mouth every 8 (eight) hours., Disp: 45 tablet, Rfl: 5 .  HYDROcodone-acetaminophen (NORCO/VICODIN) 5-325 MG tablet, Take 2 tablets by mouth every 4 (four) hours as  needed., Disp: 6 tablet, Rfl: 0 .  ipratropium-albuterol (DUONEB) 0.5-2.5 (3) MG/3ML SOLN, Take 3 mLs by nebulization every 4 (four) hours as needed., Disp: 360 mL, Rfl: 0 .  isosorbide mononitrate (IMDUR) 30 MG 24 hr tablet, Take 0.5 tablets (15 mg total) by mouth daily., Disp: 15 tablet, Rfl: 5 .  ketoconazole (NIZORAL) 2 % shampoo, Apply 1 application topically 2 (two) times a week., Disp: , Rfl:  .  magnesium oxide (MAG-OX) 400 MG tablet, Take 400 mg by mouth daily., Disp: , Rfl:  .  metoprolol (TOPROL-XL) 200 MG 24 hr tablet, Take 100 mg by mouth daily., Disp: , Rfl:  .  potassium chloride SA (K-DUR,KLOR-CON) 20 MEQ tablet, Take 20 mEq by mouth daily., Disp: , Rfl:  .  rosuvastatin (CRESTOR) 10 MG tablet, Take 1 tablet (10 mg total) by mouth daily at 6 PM., Disp: 30 tablet, Rfl: 5 .  sertraline (ZOLOFT) 25 MG tablet, Take 25 mg by mouth daily. , Disp: , Rfl:  .  spironolactone (ALDACTONE) 25 MG tablet, Take 0.5 tablets (12.5 mg total) by mouth daily., Disp: 30 tablet, Rfl: 6 .  torsemide (DEMADEX) 20 MG tablet, Take 2 tablets (40 mg total) by mouth 2 (two) times daily., Disp: 60 tablet, Rfl: 3 .  clopidogrel (PLAVIX) 75 MG tablet, Take 1 tablet (75 mg total) by mouth daily with breakfast. (Patient not taking: Reported on 02/21/2019), Disp: 30 tablet, Rfl: 0 .  digoxin (LANOXIN) 0.125 MG tablet, TK 1/2 T PO ONCE D, Disp: , Rfl:  .  nitroGLYCERIN (NITROSTAT) 0.4 MG SL tablet, Place 1 tablet (0.4 mg total) under the tongue every 5 (five) minutes x 3 doses as needed for chest pain. (Patient not taking: Reported on 02/07/2019), Disp: 25 tablet, Rfl: 0 .  senna-docusate (SENOKOT S) 8.6-50 MG tablet, Take 1 tablet by mouth 2 (two) times daily. (Patient not taking: Reported on 02/07/2019), Disp: 60 tablet, Rfl: 0 Allergies  Allergen Reactions  . Lisinopril Swelling    Facial swelling  . Xarelto [Rivaroxaban] Other (See Comments)    "Blood came out of my penis"      Social History   Socioeconomic  History  . Marital status: Married    Spouse name: Not on file  . Number of children: Not on file  . Years of education: Not on file  . Highest education level: Not on file  Occupational History  . Occupation: Retired    Comment: Former Patent examinerLPN  Social Needs  . Financial resource strain: Not on file  . Food insecurity:    Worry: Not on file    Inability: Not on file  . Transportation needs:    Medical: Not on file    Non-medical: Not on file  Tobacco Use  . Smoking status: Former Smoker    Packs/day: 1.00    Years: 15.00    Pack years: 15.00    Types: Cigarettes, Cigars  . Smokeless tobacco: Former NeurosurgeonUser  . Tobacco comment: quit smoking ~ 50 yr  ago  Substance and Sexual Activity  . Alcohol use: No    Alcohol/week: 2.0 standard drinks    Types: 2 Cans of beer per week    Comment: rare beer.  . Drug use: No  . Sexual activity: Yes  Lifestyle  . Physical activity:    Days per week: Not on file    Minutes per session: Not on file  . Stress: Not on file  Relationships  . Social connections:    Talks on phone: Not on file    Gets together: Not on file    Attends religious service: Not on file    Active member of club or organization: Not on file    Attends meetings of clubs or organizations: Not on file    Relationship status: Not on file  . Intimate partner violence:    Fear of current or ex partner: No    Emotionally abused: No    Physically abused: No    Forced sexual activity: No  Other Topics Concern  . Not on file  Social History Narrative   Lives in [place] with wife. Moved from [place] (2013). Does not routinely exercise.    Physical Exam      Future Appointments  Date Time Provider Department Center  03/07/2019  2:30 PM [facility] PA/NP [facility] None  03/13/2019  2:00 PM Laurey Morale, MD [facility] None  04/25/2019  3:45 PM Logan Bores, Larena Glassman, DPM [facility]    BP (!) 96/52   Pulse (!) 50   Temp 98 F (36.7 C)   Resp 15   Wt 195 lb (88.5 kg)    SpO2 98%   BMI 30.54 kg/m   Weight yesterday-195 Last visit weight-200 CBG PTA-116  Pt reports feeling ok, he is having trouble with his back pain today. He attempted to fill pill box back up but he had several mistakes in there. I told him to not attempt it again. He just took his meds when I arrived for the morning doses and I arrived around 115pm today. He said he couldn't take them this morning due to his back hurting and he couldn't get up early enough to take it and he had to eat first as well.  meds verified. 3 pill boxes refilled.  Did a virtual visit with amy, due to lower HR at 50bpm, she is stopping his digoxin for right now. She is stopping the metoprolol as well, apparently it was suppose to have been taken off his list at his last d/c from hosp a few months back. He denies increased sob, no edema noted, no dizziness, no c/p. 12ld EKG was done and it showed paced rhythm. B/p rechecked and nearly same as last b/p taken. Dr. Shirlee Latch wants to do another virtual visit next Tuesday at 2pm with EKG done as well.  Those meds were removed from pill boxes.   [name], EMT-Paramedic [phone] [facility] Paramedic  03/07/19

## 2019-03-07 NOTE — Addendum Note (Signed)
Encounter addended by: Shea Stakes, RN on: 03/07/2019 3:09 PM  Actions taken: Visit diagnoses modified, Order list changed, Diagnosis association updated

## 2019-03-07 NOTE — Progress Notes (Signed)
Heart Failure TeleHealth Note  Due to national recommendations of social distancing due to COVID 19, Audio/video telehealth visit is felt to be most appropriate for this patient at this time.  See MyChart message from today for patient consent regarding telehealth [facility].  Date:  03/07/2019   ID:  Ryan CoonFred Macdonald, DOB 01-05-1934, MRN [id number]  Location: Home  Provider location: [place] [facility] Type of Visit: Established patient, etc  [facility]  [facility]  Cardiologist:  Chilton Siiffany Whitehawk, MD Primary HF: Dr Shirlee LatchMcLean  Chief Complaint:  Heart Failure    History of Present Illness: Ryan Macdonald is a 85 y.o. male with a history ofsystolic HF due toNICM, Biotronic ICD, A Fibon Eliquis, DMII, HTN,CAD s/p pLAD stent in 07/2018 with residual non-obstructive disease,chronic hypoxic respiratory failure on home O2 PRN,and depression.  Admitted 12/26 - 11/28/18 with A/C systolic CHF. Diuresed 21 lbs and meds adjusted as tolerated. Echo showed EF 20-25%. EP was consulted for RV pacing and ICD settings were adjusted. He was discharged off bb.   He presents via Programmer, applicationsvideo conferencing for a telehealth visit today with HF Paramedic. Overall feeling fine but having ongoing back pain. Denies SOB/PND/Orthopnea. Denies chest pain.  Does not move around that much. No bleeding issues. Appetite ok. No fever or chills. Weight at home 195-200 pounds. Taking all medications . He has been taking metoprolol since his hospitalization.   He denies symptoms worrisome for COVID 19.   Past Medical History:  Diagnosis Date  . Atrial fibrillation (HCC)   . Cardiomyopathy (HCC)   . Cat scratch fever    removed mass on right side neck  . Cataracts, bilateral   . CHF (congestive heart failure) (HCC)   . Chronic kidney disease   . Corns and callosities   . Diabetes mellitus without complication (HCC)    TYPE 2  . Dyspnea   . Erythema intertrigo   . Flat foot   . Gout    . High cholesterol   . Hx of urethral stricture   . Hypertension   . Kidney stones   . Morbid obesity (HCC)   . Nonischemic cardiomyopathy (HCC)    a. ? 2009 Cath in [place] - nl cors per pt;  b. 09/2013 Echo: EF 25-30%, sev diff HK.  . Obesity   . Osteoarthritis   . PAF (paroxysmal atrial fibrillation) (HCC)    a. post-op hip in 2014 - prev on xarelto.  . Renal insufficiency    Past Surgical History:  Procedure Laterality Date  . CARDIAC CATHETERIZATION  1980's  . CIRCUMCISION  1940's  . CORONARY STENT INTERVENTION N/A 08/18/2018   Procedure: CORONARY STENT INTERVENTION;  Surgeon: Corky CraftsVaranasi, Jayadeep S, MD;  [facility];  Service: Cardiovascular;  Laterality: N/A;  . CYSTOSCOPY WITH URETHRAL DILATATION  10/23/2013  . CYSTOSCOPY WITH URETHRAL DILATATION N/A 10/23/2013   Procedure: CYSTOSCOPY WITH URETHRAL DILATATION;  Surgeon: Kathi LudwigSigmund I Tannenbaum, MD;  [facility];  Service: Urology;  Laterality: N/A;  . INCISION AND DRAINAGE OF WOUND Right 1980's   "cat scratch" (10/23/2013)  . INGUINAL HERNIA REPAIR Right 1950's  . INTRAVASCULAR ULTRASOUND/IVUS N/A 08/18/2018   Procedure: Intravascular Ultrasound/IVUS;  Surgeon: Corky CraftsVaranasi, Jayadeep S, MD;  [facility];  Service: Cardiovascular;  Laterality: N/A;  . KIDNEY STONE SURGERY Right 1970's; 1983   "twice"  . LEFT HEART CATH AND CORONARY ANGIOGRAPHY N/A 08/18/2018   Procedure: LEFT HEART CATH AND CORONARY ANGIOGRAPHY;  Surgeon: Corky Crafts, MD;  Location[facility]VAS[facility] CV LAB;  Service: Cardiovascular;  Laterality: N/A;  . TONSILLECTOMY AND ADENOIDECTOMY  1940's  . TOTAL HIP ARTHROPLASTY Left 10/23/2013   Procedure: TOTAL HIP ARTHROPLASTY;  Surgeon: Valeria Batman, MD;  Location: [facility] OR;  Service: Orthopedics;  Laterality: Left;     Current Outpatient Medications  Medication Sig Dispense Refill  . allopurinol (ZYLOPRIM) 100 MG tablet Take 200 mg by mouth daily as needed (for gout symptoms).      Marland Kitchen amiodarone (PACERONE) 200 MG tablet Take 1 tablet (200 mg total) by mouth daily. 30 tablet 3  . apixaban (ELIQUIS) 2.5 MG TABS tablet Take 1 tablet (2.5 mg total) by mouth 2 (two) times daily. (Patient taking differently: Take 2.5 mg by mouth 2 (two) times daily. Caution - blood thinner) 90 tablet 0  . aspirin EC 81 MG tablet Take 81 mg by mouth daily.     . cyanocobalamin 1000 MCG tablet Take 1,000 mcg by mouth daily.     . diclofenac sodium (VOLTAREN) 1 % GEL Apply 4 g topically 4 (four) times daily. 1 Tube 0  . digoxin (LANOXIN) 0.125 MG tablet TK 1/2 T PO ONCE D    . Digoxin 62.5 MCG TABS Take 0.0625 mg by mouth daily. 30 tablet 5  . docusate sodium (COLACE) 100 MG capsule Take 100 mg by mouth 2 (two) times daily.    . finasteride (PROSCAR) 5 MG tablet Take 1 tablet (5 mg total) by mouth daily. 30 tablet 0  . fluticasone furoate-vilanterol (BREO ELLIPTA) 100-25 MCG/INH AEPB Inhale 1 puff into the lungs daily. 60 each 3  . glipiZIDE (GLUCOTROL) 5 MG tablet Take 5 mg by mouth daily before breakfast.    . hydrALAZINE (APRESOLINE) 25 MG tablet Take 0.5 tablets (12.5 mg total) by mouth every 8 (eight) hours. 45 tablet 5  . HYDROcodone-acetaminophen (NORCO/VICODIN) 5-325 MG tablet Take 2 tablets by mouth every 4 (four) hours as needed. 6 tablet 0  . ipratropium-albuterol (DUONEB) 0.5-2.5 (3) MG/3ML SOLN Take 3 mLs by nebulization every 4 (four) hours as needed. 360 mL 0  . isosorbide mononitrate (IMDUR) 30 MG 24 hr tablet Take 0.5 tablets (15 mg total) by mouth daily. 15 tablet 5  . ketoconazole (NIZORAL) 2 % shampoo Apply 1 application topically 2 (two) times a week.    . magnesium oxide (MAG-OX) 400 MG tablet Take 400 mg by mouth daily.    . metoprolol (TOPROL-XL) 200 MG 24 hr tablet Take 100 mg by mouth daily.    . potassium chloride SA (K-DUR,KLOR-CON) 20 MEQ tablet Take 20 mEq by mouth daily.    . rosuvastatin (CRESTOR) 10 MG tablet Take 1 tablet (10 mg total) by mouth daily at 6 PM. 30  tablet 5  . sertraline (ZOLOFT) 25 MG tablet Take 25 mg by mouth daily.     Marland Kitchen spironolactone (ALDACTONE) 25 MG tablet Take 0.5 tablets (12.5 mg total) by mouth daily. 30 tablet 6  . torsemide (DEMADEX) 20 MG tablet Take 2 tablets (40 mg total) by mouth 2 (two) times daily. 60 tablet 3  . clopidogrel (PLAVIX) 75 MG tablet Take 1 tablet (75 mg total) by mouth daily with breakfast. (Patient not taking: Reported on 02/21/2019) 30 tablet 0  . nitroGLYCERIN (NITROSTAT) 0.4 MG SL tablet Place 1 tablet (0.4 mg total) under the tongue every 5 (five) minutes x 3 doses as needed for chest pain. (Patient not taking: Reported on 02/07/2019) 25 tablet 0  .  senna-docusate (SENOKOT S) 8.6-50 MG tablet Take 1 tablet by mouth 2 (two) times daily. (Patient not taking: Reported on 02/07/2019) 60 tablet 0   No current facility-administered medications for this encounter.     Allergies:   Lisinopril and Xarelto [rivaroxaban]   Social History:  The patient  reports that he has quit smoking. His smoking use included cigarettes and cigars. He has a 15.00 pack-year smoking history. He has quit using smokeless tobacco. He reports that he does not drink alcohol or use drugs.   Family History:  The patient's family history includes Heart Problems in his maternal grandmother; Heart disease in his mother; Other in an other family member.   ROS:  Please see the history of present illness.   All other systems are personally reviewed and negative.   Exam:  Video Health Call; Exam is visual. With HF Paramedic.  General:  Speaks in full sentences. No resp difficulty. Lungs: Normal respiratory effort with conversation.  Abdomen: Non-distended per patient report Extremities: Pt denies edema. Neuro: Alert & oriented x 3.   EKG with HF Paramedic - VPaced 50 bpm.   Recent Labs: 11/23/2018: B Natriuretic Peptide 1,382.9 11/24/2018: ALT 19; TSH 18.521 01/09/2019: Magnesium 2.0 01/30/2019: BUN 50; Creatinine, Ser 2.05; Hemoglobin  11.7; Platelets 255; Potassium 4.0; Sodium 136  Personally reviewed   Wt Readings from Last 3 Encounters:  03/07/19 88.5 kg (195 lb)  03/07/19 88.5 kg (195 lb)  02/21/19 90.7 kg (200 lb)   Vitals:   03/07/19 1412  BP: (!) 90/54  Pulse: (!) 50      ASSESSMENT AND PLAN: 1. Chronic systolic Heart Failure. Due to NICM. EF has been low since at least 2014. -He has single chamber Biotronik ICD. Narrow QRS.Settings adjusted to prevent RV pacing. -Echo12/27/19: EF 20-25%, grade 3 DD, trivial AI, mod MR, LA mild to moderately dilated, RV moderately reduced, RA mildly dilated, PA peak pressure 39 mm Hg -NYHA II - Continue torsemide 40 mg BID.  - Continue spiro 12.5 mg qhs. - SBP soft today. He may need to stop hydralazine/imdur SBP remains low. HF Paramedic will follow up next week.   -Heart rate 49 today on EKG. Will stop digoxin for now. I am also going to stop metoprolol.  This was acutally stopped in December 2019.  -No ACEI or ARNI with allergy to lisinopril.Hold off on ARB with CKD.  2. CAD - S/p PCI 07/2018 to pLAD - No s/s of ischemia.    -Plavix was stopped in March  - Continue apixiban 2.5 mg BID. - Continue crestor  3.ParoxysmalA fib - Regular on exam. Bradycardic.  - Continue amiodarone 200 mg daily.  - Continue eliquis2.5mg  twice a day (age >74, creatinine >1.5generally).  4. RV pacing - Biotronic ICD reprogrammed12/27by EPto prevent RV pacing and he had no further pacing. - On EKG today looks like he is RV pacing again. QRS looks wide.  - Refer back to Dr Graciela Husbands for follow Up.   -needs ongoing follow up. .  5. CKD III - Creatinine baseline 1.8 - 2.0 with high degree of lability, but he has been taking his diuretics wrong.   COVID screen The patient does not have any symptoms that suggest any further testing/ screening at this time.  Social distancing reinforced today.  Recommended follow-up: Follow up with Dr Shirlee Latch next week.  Refer to EP device  clinic and Dr Graciela Husbands.    Relevant cardiac medications were reviewed at length with the patient today.  The patient does not have concerns regarding their medications at this time.   The following changes were made today:  As above  Today, I have spent 25 minutes with the patient with telehealth technology discussing the above issues . Medication changes were discussed with Katie HF Paramedic.     Waneta Martins, NP  03/07/2019 2:38 PM  [facility] [address] [facility] [place] [address] [phone] (office) [phone] (fax)

## 2019-03-07 NOTE — Addendum Note (Signed)
Encounter addended by: Shea Stakes, RN on: 03/07/2019 3:14 PM  Actions taken: Order list changed, Diagnosis association updated

## 2019-03-07 NOTE — Patient Instructions (Addendum)
   Stop digoxin and metoprolol.   Refer to Dr Graciela Husbands for follow up.   We have referred to EP to establish in the device clinic.  Follow up with Dr Shirlee Latch for a virtual visit next week with HF Paramedic

## 2019-03-08 ENCOUNTER — Observation Stay (HOSPITAL_COMMUNITY): Payer: Medicare Other

## 2019-03-08 ENCOUNTER — Telehealth: Payer: Self-pay | Admitting: Cardiology

## 2019-03-08 ENCOUNTER — Other Ambulatory Visit: Payer: Self-pay

## 2019-03-08 ENCOUNTER — Encounter (HOSPITAL_COMMUNITY): Payer: Self-pay | Admitting: Emergency Medicine

## 2019-03-08 ENCOUNTER — Inpatient Hospital Stay (HOSPITAL_COMMUNITY)
Admission: EM | Admit: 2019-03-08 | Discharge: 2019-03-11 | DRG: 683 | Disposition: A | Discharge status: Home / Self Care | Payer: Medicare Other | Attending: Internal Medicine | Admitting: Internal Medicine

## 2019-03-08 ENCOUNTER — Emergency Department (HOSPITAL_COMMUNITY): Payer: Medicare Other

## 2019-03-08 DIAGNOSIS — Z7984 Long term (current) use of oral hypoglycemic drugs: Secondary | ICD-10-CM

## 2019-03-08 DIAGNOSIS — M549 Dorsalgia, unspecified: Secondary | ICD-10-CM | POA: Diagnosis present

## 2019-03-08 DIAGNOSIS — E1122 Type 2 diabetes mellitus with diabetic chronic kidney disease: Secondary | ICD-10-CM | POA: Diagnosis present

## 2019-03-08 DIAGNOSIS — Z8249 Family history of ischemic heart disease and other diseases of the circulatory system: Secondary | ICD-10-CM

## 2019-03-08 DIAGNOSIS — Z87442 Personal history of urinary calculi: Secondary | ICD-10-CM

## 2019-03-08 DIAGNOSIS — Z955 Presence of coronary angioplasty implant and graft: Secondary | ICD-10-CM

## 2019-03-08 DIAGNOSIS — N183 Chronic kidney disease, stage 3 unspecified: Secondary | ICD-10-CM | POA: Diagnosis present

## 2019-03-08 DIAGNOSIS — N189 Chronic kidney disease, unspecified: Secondary | ICD-10-CM

## 2019-03-08 DIAGNOSIS — E860 Dehydration: Secondary | ICD-10-CM | POA: Diagnosis present

## 2019-03-08 DIAGNOSIS — I428 Other cardiomyopathies: Secondary | ICD-10-CM | POA: Diagnosis present

## 2019-03-08 DIAGNOSIS — I5022 Chronic systolic (congestive) heart failure: Secondary | ICD-10-CM | POA: Diagnosis present

## 2019-03-08 DIAGNOSIS — Z7901 Long term (current) use of anticoagulants: Secondary | ICD-10-CM

## 2019-03-08 DIAGNOSIS — R740 Nonspecific elevation of levels of transaminase and lactic acid dehydrogenase [LDH]: Secondary | ICD-10-CM

## 2019-03-08 DIAGNOSIS — Z79899 Other long term (current) drug therapy: Secondary | ICD-10-CM

## 2019-03-08 DIAGNOSIS — Z87891 Personal history of nicotine dependence: Secondary | ICD-10-CM

## 2019-03-08 DIAGNOSIS — G8929 Other chronic pain: Secondary | ICD-10-CM | POA: Diagnosis present

## 2019-03-08 DIAGNOSIS — Z8701 Personal history of pneumonia (recurrent): Secondary | ICD-10-CM

## 2019-03-08 DIAGNOSIS — E7800 Pure hypercholesterolemia, unspecified: Secondary | ICD-10-CM | POA: Diagnosis present

## 2019-03-08 DIAGNOSIS — I2510 Atherosclerotic heart disease of native coronary artery without angina pectoris: Secondary | ICD-10-CM | POA: Diagnosis present

## 2019-03-08 DIAGNOSIS — E875 Hyperkalemia: Secondary | ICD-10-CM | POA: Diagnosis present

## 2019-03-08 DIAGNOSIS — N179 Acute kidney failure, unspecified: Principal | ICD-10-CM | POA: Diagnosis present

## 2019-03-08 DIAGNOSIS — R7401 Elevation of levels of liver transaminase levels: Secondary | ICD-10-CM | POA: Diagnosis present

## 2019-03-08 DIAGNOSIS — F329 Major depressive disorder, single episode, unspecified: Secondary | ICD-10-CM | POA: Diagnosis present

## 2019-03-08 DIAGNOSIS — I130 Hypertensive heart and chronic kidney disease with heart failure and stage 1 through stage 4 chronic kidney disease, or unspecified chronic kidney disease: Secondary | ICD-10-CM | POA: Diagnosis present

## 2019-03-08 DIAGNOSIS — J9611 Chronic respiratory failure with hypoxia: Secondary | ICD-10-CM | POA: Diagnosis present

## 2019-03-08 DIAGNOSIS — Z96642 Presence of left artificial hip joint: Secondary | ICD-10-CM | POA: Diagnosis present

## 2019-03-08 DIAGNOSIS — Z95810 Presence of automatic (implantable) cardiac defibrillator: Secondary | ICD-10-CM

## 2019-03-08 DIAGNOSIS — M109 Gout, unspecified: Secondary | ICD-10-CM | POA: Diagnosis present

## 2019-03-08 DIAGNOSIS — Z9981 Dependence on supplemental oxygen: Secondary | ICD-10-CM

## 2019-03-08 DIAGNOSIS — Z9861 Coronary angioplasty status: Secondary | ICD-10-CM

## 2019-03-08 DIAGNOSIS — M1990 Unspecified osteoarthritis, unspecified site: Secondary | ICD-10-CM | POA: Diagnosis present

## 2019-03-08 DIAGNOSIS — I472 Ventricular tachycardia: Secondary | ICD-10-CM | POA: Diagnosis not present

## 2019-03-08 DIAGNOSIS — R109 Unspecified abdominal pain: Secondary | ICD-10-CM | POA: Diagnosis present

## 2019-03-08 DIAGNOSIS — I480 Paroxysmal atrial fibrillation: Secondary | ICD-10-CM

## 2019-03-08 DIAGNOSIS — Z888 Allergy status to other drugs, medicaments and biological substances status: Secondary | ICD-10-CM

## 2019-03-08 DIAGNOSIS — N400 Enlarged prostate without lower urinary tract symptoms: Secondary | ICD-10-CM | POA: Diagnosis present

## 2019-03-08 LAB — COMPREHENSIVE METABOLIC PANEL
ALT: 71 U/L — ABNORMAL HIGH (ref 0–44)
AST: 93 U/L — ABNORMAL HIGH (ref 15–41)
Albumin: 2.9 g/dL — ABNORMAL LOW (ref 3.5–5.0)
Alkaline Phosphatase: 75 U/L (ref 38–126)
Anion gap: 12 (ref 5–15)
BUN: 64 mg/dL — ABNORMAL HIGH (ref 8–23)
CO2: 23 mmol/L (ref 22–32)
Calcium: 9 mg/dL (ref 8.9–10.3)
Chloride: 102 mmol/L (ref 98–111)
Creatinine, Ser: 4.33 mg/dL — ABNORMAL HIGH (ref 0.61–1.24)
GFR calc Af Amer: 14 mL/min — ABNORMAL LOW (ref >60)
GFR calc non Af Amer: 12 mL/min — ABNORMAL LOW (ref >60)
Glucose, Bld: 50 mg/dL — ABNORMAL LOW (ref 70–99)
Potassium: 5.3 mmol/L — ABNORMAL HIGH (ref 3.5–5.1)
Sodium: 137 mmol/L (ref 135–145)
Total Bilirubin: 0.8 mg/dL (ref 0.3–1.2)
Total Protein: 7.2 g/dL (ref 6.5–8.1)

## 2019-03-08 LAB — CBC WITH DIFFERENTIAL/PLATELET
Abs Immature Granulocytes: 0.01 10*3/uL (ref 0.00–0.07)
Basophils Absolute: 0 10*3/uL (ref 0.0–0.1)
Basophils Relative: 1 %
Eosinophils Absolute: 0.2 10*3/uL (ref 0.0–0.5)
Eosinophils Relative: 3 %
HCT: 33 % — ABNORMAL LOW (ref 39.0–52.0)
Hemoglobin: 10.5 g/dL — ABNORMAL LOW (ref 13.0–17.0)
Immature Granulocytes: 0 %
Lymphocytes Relative: 26 %
Lymphs Abs: 1.4 10*3/uL (ref 0.7–4.0)
MCH: 29.7 pg (ref 26.0–34.0)
MCHC: 31.8 g/dL (ref 30.0–36.0)
MCV: 93.2 fL (ref 80.0–100.0)
Monocytes Absolute: 1 10*3/uL (ref 0.1–1.0)
Monocytes Relative: 18 %
Neutro Abs: 2.9 10*3/uL (ref 1.7–7.7)
Neutrophils Relative %: 52 %
Platelets: 244 10*3/uL (ref 150–400)
RBC: 3.54 MIL/uL — ABNORMAL LOW (ref 4.22–5.81)
RDW: 18.2 % — ABNORMAL HIGH (ref 11.5–15.5)
WBC: 5.5 10*3/uL (ref 4.0–10.5)
nRBC: 0 % (ref 0.0–0.2)

## 2019-03-08 LAB — URINALYSIS, ROUTINE W REFLEX MICROSCOPIC
Bilirubin Urine: NEGATIVE
Glucose, UA: NEGATIVE mg/dL
Hgb urine dipstick: NEGATIVE
Ketones, ur: NEGATIVE mg/dL
Nitrite: NEGATIVE
Protein, ur: NEGATIVE mg/dL
Specific Gravity, Urine: 1.01 (ref 1.005–1.030)
pH: 5 (ref 5.0–8.0)

## 2019-03-08 LAB — CBG MONITORING, ED: Glucose-Capillary: 96 mg/dL (ref 70–99)

## 2019-03-08 MED ORDER — ISOSORBIDE MONONITRATE ER 30 MG PO TB24
15.0000 mg | ORAL_TABLET | Freq: Every day | ORAL | Status: DC
  Administered 2019-03-09 – 2019-03-11 (×3): 15 mg via ORAL
  Filled 2019-03-08 (×3): qty 1

## 2019-03-08 MED ORDER — FLUTICASONE FUROATE-VILANTEROL 100-25 MCG/INH IN AEPB
1.0000 | INHALATION_SPRAY | Freq: Every day | RESPIRATORY_TRACT | Status: DC
  Filled 2019-03-08: qty 28

## 2019-03-08 MED ORDER — ACETAMINOPHEN 650 MG RE SUPP: 650.0000 mg | Freq: Four times a day (QID) | RECTAL | Status: DC | PRN

## 2019-03-08 MED ORDER — INSULIN ASPART 100 UNIT/ML ~~LOC~~ SOLN
0.0000 [IU] | Freq: Three times a day (TID) | SUBCUTANEOUS | Status: DC
  Administered 2019-03-10: 2 [IU] via SUBCUTANEOUS

## 2019-03-08 MED ORDER — FLUTICASONE FUROATE-VILANTEROL 100-25 MCG/INH IN AEPB
1.0000 | INHALATION_SPRAY | Freq: Every day | RESPIRATORY_TRACT | Status: DC
  Administered 2019-03-09 – 2019-03-10 (×2): 1 via RESPIRATORY_TRACT
  Filled 2019-03-08: qty 28

## 2019-03-08 MED ORDER — ROSUVASTATIN CALCIUM 5 MG PO TABS
10.0000 mg | ORAL_TABLET | Freq: Every day | ORAL | Status: DC
  Administered 2019-03-08 – 2019-03-10 (×3): 10 mg via ORAL
  Filled 2019-03-08 (×3): qty 2

## 2019-03-08 MED ORDER — FINASTERIDE 5 MG PO TABS
5.0000 mg | ORAL_TABLET | Freq: Every day | ORAL | Status: DC
  Administered 2019-03-09 – 2019-03-11 (×3): 5 mg via ORAL
  Filled 2019-03-08 (×3): qty 1

## 2019-03-08 MED ORDER — DEXTROSE 50 % IV SOLN
25.0000 mL | Freq: Once | INTRAVENOUS | Status: AC
  Administered 2019-03-08: 25 mL via INTRAVENOUS
  Filled 2019-03-08: qty 50

## 2019-03-08 MED ORDER — SENNOSIDES-DOCUSATE SODIUM 8.6-50 MG PO TABS: 1.0000 | ORAL_TABLET | Freq: Every evening | ORAL | Status: DC | PRN

## 2019-03-08 MED ORDER — AMIODARONE HCL 200 MG PO TABS
200.0000 mg | ORAL_TABLET | Freq: Every day | ORAL | Status: DC
  Administered 2019-03-09 – 2019-03-11 (×4): 200 mg via ORAL
  Filled 2019-03-08 (×4): qty 1

## 2019-03-08 MED ORDER — ACETAMINOPHEN 325 MG PO TABS
650.0000 mg | ORAL_TABLET | Freq: Four times a day (QID) | ORAL | Status: DC | PRN
  Administered 2019-03-09: 650 mg via ORAL
  Filled 2019-03-08: qty 2

## 2019-03-08 MED ORDER — APIXABAN 2.5 MG PO TABS
2.5000 mg | ORAL_TABLET | Freq: Two times a day (BID) | ORAL | Status: DC
  Administered 2019-03-08 – 2019-03-11 (×6): 2.5 mg via ORAL
  Filled 2019-03-08 (×6): qty 1

## 2019-03-08 MED ORDER — PROMETHAZINE HCL 25 MG PO TABS: 12.5000 mg | ORAL_TABLET | Freq: Four times a day (QID) | ORAL | Status: DC | PRN

## 2019-03-08 MED ORDER — SPIRONOLACTONE 12.5 MG HALF TABLET
12.5000 mg | ORAL_TABLET | Freq: Every day | ORAL | Status: DC
  Administered 2019-03-09 – 2019-03-11 (×3): 12.5 mg via ORAL
  Filled 2019-03-08 (×3): qty 1

## 2019-03-08 MED ORDER — ENOXAPARIN SODIUM 30 MG/0.3ML ~~LOC~~ SOLN: 30.0000 mg | SUBCUTANEOUS | Status: DC

## 2019-03-08 MED ORDER — SODIUM CHLORIDE 0.9 % IV BOLUS
500.0000 mL | Freq: Once | INTRAVENOUS | Status: AC
  Administered 2019-03-08: 18:00:00 500 mL via INTRAVENOUS

## 2019-03-08 NOTE — H&P (Signed)
Date: 03/08/2019               Patient Name:  Ryan Macdonald MRN: [id number]  DOB: [birth year] Age / Sex: 85 y.o., male   PCP: [facility]         Medical Service: Internal Medicine Teaching Service         Attending Physician: Dr. Mikey Bussing, Marthenia Rolling, DO    First Contact: Dr. Criss Alvine Pager: [phone]  Second Contact: Dr. Delma Officer Pager: [phone]       After Hours (After 5p/  First Contact Pager: [phone]  weekends / holidays): Second Contact Pager: [phone]   Chief Complaint: Left flank pain  History of Present Illness: [name] is an 85 year old male with systolic heart failure ef of 20-25% in Dec 2019 s/p single chamber biotronic ICD, atrial fibrillation on Eliquis, type 2 diabetes, HTN, CAD s/p pLAD stent in 07/2018 with residual nonobstructive disease, and chronic hypoxic respiratory failure on home oxygen who presents with left flank pain for the last 4 days.  He states that he was in his usual state of health up until 4 days ago when he began to have intermittent and nonradiating left flank pain.  The pain is brought on by movement including standing up.  He denies flank pain currently.  He also denies hematuria, dysuria, fevers, chills, changes in bowel movements.  He denies chest pain, shortness of breath, palpitations off, congestion.  He denies sick contacts and recent travel.  He was seen by his cardiologist yesterday and was noted to be bradycardic.  He was instructed to stop digoxin and metoprolol and continue torsemide and spironolactone.    Of note he has been eating better since his last hospitalization in November, 2019 and has lost approximately 30 pounds since then.  He will have 1-2 meals per day including "chicken, salads".  In the ED he was found to have soft blood pressures with SBP from 92-107 and maps of the 70s.  Vital signs were otherwise unremarkable.  Labs were significant for upper kalemia with potassium 5.3, acute renal failure with creatinine of  4.33 (baseline appears to be between 1.9-2.2; last creatinine 2.02 one month ago).  Urinalysis significant for good sites and few bacteria.  He was given IV fluids in the ED.  Meds:  Current Meds  Medication Sig  . allopurinol (ZYLOPRIM) 100 MG tablet Take 200 mg by mouth daily as needed (for gout symptoms).   Marland Kitchen amiodarone (PACERONE) 200 MG tablet Take 1 tablet (200 mg total) by mouth daily.  Marland Kitchen apixaban (ELIQUIS) 2.5 MG TABS tablet Take 1 tablet (2.5 mg total) by mouth 2 (two) times daily. (Patient taking differently: Take 2.5 mg by mouth 2 (two) times daily. Caution - blood thinner)  . cyanocobalamin 1000 MCG tablet Take 1,000 mcg by mouth daily.   . diclofenac sodium (VOLTAREN) 1 % GEL Apply 4 g topically 4 (four) times daily.  Marland Kitchen docusate sodium (COLACE) 100 MG capsule Take 100 mg by mouth 2 (two) times daily.  . finasteride (PROSCAR) 5 MG tablet Take 1 tablet (5 mg total) by mouth daily.  Marland Kitchen glipiZIDE (GLUCOTROL) 5 MG tablet Take 5 mg by mouth daily before breakfast.  . hydrALAZINE (APRESOLINE) 25 MG tablet Take 0.5 tablets (12.5 mg total) by mouth every 8 (eight) hours.  Marland Kitchen HYDROcodone-acetaminophen (NORCO/VICODIN) 5-325 MG tablet Take 2 tablets by mouth every 4 (four) hours as needed. (Patient taking differently: Take 2 tablets by mouth every 4 (four) hours  as needed for moderate pain. )  . isosorbide mononitrate (IMDUR) 30 MG 24 hr tablet Take 0.5 tablets (15 mg total) by mouth daily.  Marland Kitchen ketoconazole (NIZORAL) 2 % shampoo Apply 1 application topically 2 (two) times a week.  . magnesium oxide (MAG-OX) 400 MG tablet Take 400 mg by mouth daily.  . potassium chloride SA (K-DUR,KLOR-CON) 20 MEQ tablet Take 20 mEq by mouth daily.  . rosuvastatin (CRESTOR) 10 MG tablet Take 1 tablet (10 mg total) by mouth daily at 6 PM.  . sertraline (ZOLOFT) 25 MG tablet Take 25 mg by mouth daily.   Marland Kitchen spironolactone (ALDACTONE) 25 MG tablet Take 0.5 tablets (12.5 mg total) by mouth daily.  Marland Kitchen torsemide (DEMADEX)  20 MG tablet Take 2 tablets (40 mg total) by mouth 2 (two) times daily.     Allergies: Allergies as of 03/08/2019 - Review Complete 03/08/2019  Allergen Reaction Noted  . Lisinopril Swelling 10/11/2013  . Xarelto [rivaroxaban] Other (See Comments) 02/13/2018   Past Medical History:  Diagnosis Date  . Atrial fibrillation (HCC)   . Cardiomyopathy (HCC)   . Cat scratch fever    removed mass on right side neck  . Cataracts, bilateral   . CHF (congestive heart failure) (HCC)   . Chronic kidney disease   . Corns and callosities   . Diabetes mellitus without complication (HCC)    TYPE 2  . Dyspnea   . Erythema intertrigo   . Flat foot   . Gout   . High cholesterol   . Hx of urethral stricture   . Hypertension   . Kidney stones   . Morbid obesity (HCC)   . Nonischemic cardiomyopathy (HCC)    a. ? 2009 Cath in [place] - nl cors per pt;  b. 09/2013 Echo: EF 25-30%, sev diff HK.  . Obesity   . Osteoarthritis   . PAF (paroxysmal atrial fibrillation) (HCC)    a. post-op hip in 2014 - prev on xarelto.  . Renal insufficiency     Family History:  Family History  Problem Relation Age of Onset  . Heart disease Mother   . Heart Problems Maternal Grandmother   . Other Other        negative for premature CAD    Social History: He lives with his wife in [place]. He denies tobacco and illicit drug use.  He drinks wine and beer socially/occasionally.    Review of Systems: A complete ROS was negative except as per HPI.   Physical Exam: Blood pressure (!) 101/57, pulse (!) 50, temperature 98.4 F (36.9 C), temperature source Oral, resp. rate 12, height 5\' 7"  (1.702 m), weight 90.3 kg, SpO2 94 %. General: Lying in bed in no acute distress HEENT: Normocephalic and atraumatic.  EOMI. Cardiovascular: Bradycardic.  Normal rhythm. Respiratory: Clear to auscultation bilaterally, normal work of breathing, normal oxygen saturations on room air Abdominal: Soft, nontender  palpation, no masses appreciated GU: No CVA tenderness MSK: No tenderness to palpation of spine or paraspinal muscles; no lower extremity edema, erythema, or warmth noted bilaterally. Neuro: Alert and oriented Psych: Normal affect, mood, behavior  EKG: personally reviewed my interpretation is sinus bradycardia with heart rate of 50.  CT Renal Stone Study: IMPRESSION: 1. No acute abnormality of the abdomen or pelvis. 2. Right posterolateral abdominal wall hernia containing the ileocecal junction without evidence of obstruction or inflammation. 3. Atrophic right kidney with multiple nonobstructing calculi measuring up to 2.9 cm. 4. Coronary artery and Aortic Atherosclerosis (  ICD10-I70.0).  Assessment & Plan by Problem: Active Problems:   Acute renal failure (ARF) [facility])  Mr. Cardullo is an 85 year old male with systolic heart failure s/p ICD, afib on Eliquis, T2DM, HTN, CAD s/p pLAD stent, chronic hypoxic respiratory failure w/ home supplemental O2 PRN who presents with left flank pain and found to have acute renal failure.    Acute renal failure: Differential diagnosis for ARF occludes the pre-renal etiology following decreased p.o. intake (has lost 30 pounds in the last 4 months) and cardiorenal syndrome.  Also has a history of BPH and symptoms may be due to urinary outlet obstruction.  He denies any nephrotoxic medications including NSAIDs and has not had any recent IV contrast.  I do not believe that his left flank pain is associated with the acute renal failure. - S/p 100 mils IV fluid bolus - Bladder scan - Renal ultrasound - Follow urine output - Continue finasteride 5 mg daily  Left flank pain: He has no CVA tenderness which would suggest against a renal process.  Additionally the CT renal study showed right stones not left.  UA was unremarkable--no signs of infection. Symptoms may be due to his chronic back pain. - Tylenol 650 mg every 6 hours PRN pain  Hyperkalemia: Potassium  5.3.  Likely due to acute renal failure. - Will treat underlying acute renal failure IV fluids per above. -Repeat BMP in the morning.  If it continues to be elevated will consider potassium lowering medications.  Chronic systolic heart failure s/p ICD: Appear to be euvolemic on exam.  He has not had an echo since December, 2019. - Hold torsemide in the setting of acute renal failure - Continue spironolactone 12.5 mg daily - Continue isosorbide 10 mg daily - Continuous cardiac monitoring  Paroxysmal Atrial fibrillation: Currently has sinus bradycardia.  His patient cardiologist recommended holding metoprolol and digoxin. - Continue apixaban 2.5 mg twice daily - Continue amiodarone  CAD s/p pLAD stent in place: Stable.  Denies chest pain. - Continue to monitor 200 mg daily  Transaminitis: AST/ALT today 93/71.  He had normal LFTs in 10/2017. - Hepatitis B core antibody, surface antibody, surface antigen, - Hepatitis C antibody  FEN/GI: Heart, fluid restriction CODE STATUS: Full DVT prophylaxis: home apixaban  Dispo: Admit patient to Observation with expected length of stay less than 2 midnights.  Signed: Synetta Shadow, MD 03/08/2019, 5:48 PM  Pager: [phone]

## 2019-03-08 NOTE — ED Provider Notes (Signed)
[name] EMERGENCY DEPARTMENT Provider Note   CSN: [id number] Arrival date & time: 03/08/19  1429    History   Chief Complaint Chief Complaint  Patient presents with  . Back Pain    HPI Elven Groome is a 85 y.o. male.     HPI Patient with history of chronic back pain for which he takes hydrocodone states he is having worsening left flank pain over the last 4 days.  Pain does not radiate.  No known trauma.  Denies hematuria, dysuria, frequency or incontinence.  Denies nausea vomiting.  States he is having regular bowel movements.  No fever or chills.  No cough or shortness of breath.  Patient seen by his cardiologist yesterday and noted to be bradycardic and mildly hypotensive.  Digoxin and metoprolol was stopped.  Patient denies lightheadedness or dizziness.  No focal weakness or numbness.  States he called the nurse at the [place] and was referred to the emergency department. Past Medical History:  Diagnosis Date  . Atrial fibrillation (HCC)   . Cardiomyopathy (HCC)   . Cat scratch fever    removed mass on right side neck  . Cataracts, bilateral   . CHF (congestive heart failure) (HCC)   . Chronic kidney disease   . Corns and callosities   . Diabetes mellitus without complication (HCC)    TYPE 2  . Dyspnea   . Erythema intertrigo   . Flat foot   . Gout   . High cholesterol   . Hx of urethral stricture   . Hypertension   . Kidney stones   . Morbid obesity (HCC)   . Nonischemic cardiomyopathy (HCC)    a. ? 2009 Cath in [place] - nl cors per pt;  b. 09/2013 Echo: EF 25-30%, sev diff HK.  . Obesity   . Osteoarthritis   . PAF (paroxysmal atrial fibrillation) (HCC)    a. post-op hip in 2014 - prev on xarelto.  . Renal insufficiency     Patient Active Problem List   Diagnosis Date Noted  . Acute renal failure (ARF) (HCC) 03/08/2019  . Acute exacerbation of CHF (congestive heart failure) (HCC) 11/23/2018  . Acute on chronic heart failure (HCC) 10/19/2018   . Pulmonary emphysema (HCC)   . Chronic respiratory failure with hypoxia (HCC)   . Morbid obesity (HCC) 09/09/2018  . URI (upper respiratory infection) 08/31/2018  . UTI (urinary tract infection) 08/31/2018  . Acute on chronic systolic (congestive) heart failure (HCC) 08/28/2018  . CHF (congestive heart failure) (HCC) 08/14/2018  . Acute on chronic combined systolic and diastolic CHF (congestive heart failure) (HCC) 07/18/2018  . First degree AV block   . Shortness of breath 04/29/2018  . Acute HFrEF (heart failure with reduced ejection fraction) (HCC) 04/29/2018  . Acute respiratory failure with hypoxia (HCC) 09/27/2017  . CHF exacerbation (HCC) 09/27/2017  . Renal insufficiency   . Acute on chronic systolic CHF (congestive heart failure) (HCC) 07/08/2017  . Left knee pain 07/08/2017  . Acute bronchitis 05/10/2017  . Acute congestive heart failure (HCC) 04/14/2017  . Elevated troponin 04/14/2017  . Lactic acidosis   . Acute on chronic systolic heart failure, NYHA class 2 (HCC) 02/03/2017  . Diabetes mellitus type 2 in obese (HCC) 02/03/2017  . CAP (community acquired pneumonia) 02/03/2017  . Lobar pneumonia (HCC)   . Diabetes mellitus with complication (HCC)   . Acute on chronic respiratory failure with hypoxia (HCC)   . Acute on chronic combined systolic and diastolic  heart failure (HCC) 05/19/2016  . Depression 05/19/2016  . Gout 05/19/2016  . 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 NSVT (nonsustained ventricular tachycardia) (HCC)   . Chronic renal insufficiency, stage 3 (moderate) (HCC)   . Pulmonary embolism (HCC)   . Acute respiratory failure (HCC)   . Osteoarthritis of left hip 10/25/2013  . Atrial fibrillation (HCC) 10/25/2013  . S/P hip replacement 10/24/2013  . Nonischemic cardiomyopathy (HCC)   . Obesity (BMI 30-39.9)   . Essential hypertension   . Hyperlipidemia     Past Surgical History:  Procedure Laterality Date  . CARDIAC  CATHETERIZATION  1980's  . CIRCUMCISION  1940's  . CORONARY STENT INTERVENTION N/A 08/18/2018   Procedure: CORONARY STENT INTERVENTION;  Surgeon: Corky Crafts, MD;  Location: [facility] INVASIVE CV LAB;  Service: Cardiovascular;  Laterality: N/A;  . CYSTOSCOPY WITH URETHRAL DILATATION  10/23/2013  . CYSTOSCOPY WITH URETHRAL DILATATION N/A 10/23/2013   Procedure: CYSTOSCOPY WITH URETHRAL DILATATION;  Surgeon: Kathi Ludwig, MD;  Location: [facility] OR;  Service: Urology;  Laterality: N/A;  . INCISION AND DRAINAGE OF WOUND Right 1980's   "cat scratch" (10/23/2013)  . INGUINAL HERNIA REPAIR Right 1950's  . INTRAVASCULAR ULTRASOUND/IVUS N/A 08/18/2018   Procedure: Intravascular Ultrasound/IVUS;  Surgeon: Corky Crafts, MD;  Location: [facility] INVASIVE CV LAB;  Service: Cardiovascular;  Laterality: N/A;  . KIDNEY STONE SURGERY Right 1970's; 1983   "twice"  . LEFT HEART CATH AND CORONARY ANGIOGRAPHY N/A 08/18/2018   Procedure: LEFT HEART CATH AND CORONARY ANGIOGRAPHY;  Surgeon: Corky Crafts, MD;  Location: [facility] INVASIVE CV LAB;  Service: Cardiovascular;  Laterality: N/A;  . TONSILLECTOMY AND ADENOIDECTOMY  1940's  . TOTAL HIP ARTHROPLASTY Left 10/23/2013   Procedure: TOTAL HIP ARTHROPLASTY;  Surgeon: Valeria Batman, MD;  Location: [facility] OR;  Service: Orthopedics;  Laterality: Left;        Home Medications    Prior to Admission medications   Medication Sig Start Date End Date Taking? Authorizing Provider  allopurinol (ZYLOPRIM) 100 MG tablet Take 200 mg by mouth daily as needed (for gout symptoms).    Yes [provider]  amiodarone (PACERONE) 200 MG tablet Take 1 tablet (200 mg total) by mouth daily. 01/09/19  Yes Graciella Freer, PA-C  apixaban (ELIQUIS) 2.5 MG TABS tablet Take 1 tablet (2.5 mg total) by mouth 2 (two) times daily. Patient taking differently: Take 2.5 mg by mouth 2 (two) times daily. Caution - blood thinner 05/04/18  Yes Winfrey, Kimberlee Nearing, MD   cyanocobalamin 1000 MCG tablet Take 1,000 mcg by mouth daily.    Yes [provider]  diclofenac sodium (VOLTAREN) 1 % GEL Apply 4 g topically 4 (four) times daily. 07/10/17  Yes Valentino Nose, MD  docusate sodium (COLACE) 100 MG capsule Take 100 mg by mouth 2 (two) times daily.   Yes [provider]  finasteride (PROSCAR) 5 MG tablet Take 1 tablet (5 mg total) by mouth daily. 11/29/18  Yes Alford Highland, NP  glipiZIDE (GLUCOTROL) 5 MG tablet Take 5 mg by mouth daily before breakfast.   Yes [provider]  hydrALAZINE (APRESOLINE) 25 MG tablet Take 0.5 tablets (12.5 mg total) by mouth every 8 (eight) hours. 12/12/18  Yes Graciella Freer, PA-C  HYDROcodone-acetaminophen (NORCO/VICODIN) 5-325 MG tablet Take 2 tablets by mouth every 4 (four) hours as needed. Patient taking differently: Take 2 tablets by mouth every 4 (four) hours as needed for moderate pain.  04/09/16  Yes Katrinka Blazing,  Rudene Anda, MD  isosorbide mononitrate (IMDUR) 30 MG 24 hr tablet Take 0.5 tablets (15 mg total) by mouth daily. 12/12/18  Yes Graciella Freer, PA-C  ketoconazole (NIZORAL) 2 % shampoo Apply 1 application topically 2 (two) times a week.   Yes [provider]  magnesium oxide (MAG-OX) 400 MG tablet Take 400 mg by mouth daily.   Yes [provider]  potassium chloride SA (K-DUR,KLOR-CON) 20 MEQ tablet Take 20 mEq by mouth daily.   Yes [provider]  rosuvastatin (CRESTOR) 10 MG tablet Take 1 tablet (10 mg total) by mouth daily at 6 PM. 12/22/18  Yes [name], MD  sertraline (ZOLOFT) 25 MG tablet Take 25 mg by mouth daily.    Yes [provider]  spironolactone (ALDACTONE) 25 MG tablet Take 0.5 tablets (12.5 mg total) by mouth daily. 01/09/19  Yes Graciella Freer, PA-C  torsemide (DEMADEX) 20 MG tablet Take 2 tablets (40 mg total) by mouth 2 (two) times daily. 01/09/19  Yes Graciella Freer, PA-C  fluticasone furoate-vilanterol (BREO  ELLIPTA) 100-25 MCG/INH AEPB Inhale 1 puff into the lungs daily. Patient not taking: Reported on 03/08/2019 05/05/18   Angelita Ingles, MD  ipratropium-albuterol (DUONEB) 0.5-2.5 (3) MG/3ML SOLN Take 3 mLs by nebulization every 4 (four) hours as needed. Patient not taking: Reported on 03/08/2019 05/14/17   Pearson Grippe, MD  nitroGLYCERIN (NITROSTAT) 0.4 MG SL tablet Place 1 tablet (0.4 mg total) under the tongue every 5 (five) minutes x 3 doses as needed for chest pain. Patient not taking: Reported on 02/07/2019 08/19/18   Arty Baumgartner, NP    Family History Family History  Problem Relation Age of Onset  . Heart disease Mother   . Heart Problems Maternal Grandmother   . Other Other        negative for premature CAD    Social History Social History   Tobacco Use  . Smoking status: Former Smoker    Packs/day: 1.00    Years: 15.00    Pack years: 15.00    Types: Cigarettes, Cigars  . Smokeless tobacco: Former Neurosurgeon  . Tobacco comment: quit smoking ~ 50 yr ago  Substance Use Topics  . Alcohol use: No    Alcohol/week: 2.0 standard drinks    Types: 2 Cans of beer per week    Comment: rare beer.  . Drug use: No     Allergies   Lisinopril and Xarelto [rivaroxaban]   Review of Systems Review of Systems  Constitutional: Negative for chills and fever.  Eyes: Negative for visual disturbance.  Respiratory: Negative for cough and shortness of breath.   Cardiovascular: Negative for chest pain and leg swelling.  Gastrointestinal: Negative for abdominal pain, constipation, diarrhea, nausea and vomiting.  Genitourinary: Positive for flank pain. Negative for dysuria, frequency and hematuria.  Musculoskeletal: Positive for back pain and myalgias. Negative for neck pain.  Skin: Negative for rash and wound.  Neurological: Negative for dizziness, weakness, light-headedness, numbness and headaches.  All other systems reviewed and are negative.    Physical Exam Updated Vital Signs BP (!)  101/57   Pulse (!) 50   Temp 98.4 F (36.9 C) (Oral)   Resp 12   Ht 5\' 7"  (1.702 m)   Wt 90.3 kg   SpO2 94%   BMI 31.17 kg/m   Physical Exam Vitals signs and nursing note reviewed.  Constitutional:      Appearance: Normal appearance. He is well-developed.  HENT:  Head: Normocephalic and atraumatic.     Mouth/Throat:     Mouth: Mucous membranes are moist.  Eyes:     Pupils: Pupils are equal, round, and reactive to light.  Neck:     Musculoskeletal: Normal range of motion and neck supple.  Cardiovascular:     Rate and Rhythm: Regular rhythm. Bradycardia present.     Heart sounds: No murmur. No friction rub. No gallop.   Pulmonary:     Effort: Pulmonary effort is normal. No respiratory distress.     Breath sounds: Normal breath sounds. No stridor. No wheezing, rhonchi or rales.  Chest:     Chest wall: No tenderness.  Abdominal:     General: Bowel sounds are normal.     Palpations: Abdomen is soft. There is no mass.     Tenderness: There is no abdominal tenderness. There is left CVA tenderness. There is no right CVA tenderness, guarding or rebound.  Musculoskeletal: Normal range of motion.        General: No swelling, tenderness, deformity or signs of injury.     Right lower leg: No edema.     Left lower leg: No edema.     Comments: No midline thoracic or lumbar tenderness.  Left-sided CVA tenderness.  No lower extremity swelling or asymmetry.  Distal pulses intact.  Skin:    General: Skin is warm and dry.     Findings: No erythema or rash.  Neurological:     General: No focal deficit present.     Mental Status: He is alert and oriented to person, place, and time.     Comments: 5/5 motor in all extremities.  Sensation fully intact.  No saddle anesthesia.  Psychiatric:        Behavior: Behavior normal.      ED Treatments / Results  Labs (all labs ordered are listed, but only abnormal results are displayed) Labs Reviewed  CBC WITH DIFFERENTIAL/PLATELET -  Abnormal; Notable for the following components:      Result Value   RBC 3.54 (*)    Hemoglobin 10.5 (*)    HCT 33.0 (*)    RDW 18.2 (*)    All other components within normal limits  COMPREHENSIVE METABOLIC PANEL - Abnormal; Notable for the following components:   Potassium 5.3 (*)    Glucose, Bld 50 (*)    BUN 64 (*)    Creatinine, Ser 4.33 (*)    Albumin 2.9 (*)    AST 93 (*)    ALT 71 (*)    GFR calc non Af Amer 12 (*)    GFR calc Af Amer 14 (*)    All other components within normal limits  URINALYSIS, ROUTINE W REFLEX MICROSCOPIC  BASIC METABOLIC PANEL  CBC    EKG EKG Interpretation  Date/Time:  Thursday March 08 2019 15:22:37 EDT Ventricular Rate:  50 PR Interval:    QRS Duration: 177 QT Interval:  513 QTC Calculation: 468 R Axis:   -84 Text Interpretation:  Confirmed by Loren Racer ([id number]) on 03/08/2019 3:26:11 PM Also confirmed by Loren Racer ([id number]), editor Barbette Hair [phone])  on 03/08/2019 3:35:50 PM   Radiology Ct Renal Stone Study  Result Date: 03/08/2019 CLINICAL DATA:  Flank pain, low left back, for 4 days EXAM: CT ABDOMEN AND PELVIS WITHOUT CONTRAST TECHNIQUE: Multidetector CT imaging of the abdomen and pelvis was performed following the standard protocol without IV contrast. COMPARISON:  None. FINDINGS: LOWER CHEST: There is no basilar pleural or apical pericardial  effusion. There are coronary artery calcifications. HEPATOBILIARY: The hepatic contours and density are normal. There is no intra- or extrahepatic biliary dilatation. The gallbladder is normal. PANCREAS: The pancreatic parenchymal contours are normal and there is no ductal dilatation. There is no peripancreatic fluid collection. SPLEEN: Normal. ADRENALS/URINARY TRACT: --Adrenal glands: Normal. --Right kidney/ureter: The right kidney is atrophic. There are multiple calcifications, the largest of which measures 2.9 cm. No hydronephrosis. --Left kidney/ureter: No hydronephrosis,  nephroureterolithiasis, perinephric stranding or solid renal mass. --Urinary bladder: Diverticulum off the right dome of the bladder. STOMACH/BOWEL: --Stomach/Duodenum: There is no hiatal hernia or other gastric abnormality. The duodenal course and caliber are normal. --Small bowel: No dilatation or inflammation. --Colon: The ileocecal junction is contained in a right posterolateral abdominal wall hernia. No evidence of obstruction. No focal colonic abnormality. --Appendix: Not visualized. No right lower quadrant inflammation or free fluid. VASCULAR/LYMPHATIC: There is aortic atherosclerosis without hemodynamically significant stenosis. No abdominal or pelvic lymphadenopathy. REPRODUCTIVE: There are calcifications within the normal-sized prostate. Symmetric seminal vesicles. MUSCULOSKELETAL. Multilevel degenerative disc disease and facet arthrosis. No bony spinal canal stenosis. Left hip arthroplasty. OTHER: None. IMPRESSION: 1. No acute abnormality of the abdomen or pelvis. 2. Right posterolateral abdominal wall hernia containing the ileocecal junction without evidence of obstruction or inflammation. 3. Atrophic right kidney with multiple nonobstructing calculi measuring up to 2.9 cm. 4. Coronary artery and Aortic Atherosclerosis (ICD10-I70.0). Electronically Signed   By: Deatra RobinsonKevin  Herman M.D.   On: 03/08/2019 15:45    Procedures Procedures (including critical care time)  Medications Ordered in ED Medications  dextrose 50 % solution 25 mL (has no administration in time range)  enoxaparin (LOVENOX) injection 30 mg (has no administration in time range)  acetaminophen (TYLENOL) tablet 650 mg (has no administration in time range)    Or  acetaminophen (TYLENOL) suppository 650 mg (has no administration in time range)  senna-docusate (Senokot-S) tablet 1 tablet (has no administration in time range)  promethazine (PHENERGAN) tablet 12.5 mg (has no administration in time range)  sodium chloride 0.9 % bolus 500  mL (500 mLs Intravenous New Bag/Given 03/08/19 1749)     Initial Impression / Assessment and Plan / ED Course  I have reviewed the triage vital signs and the nursing notes.  Pertinent labs & imaging results that were available during my care of the patient were reviewed by me and considered in my medical decision making (see chart for details).        No acute findings on CT renal study.  Patient does have significant elevation in his creatinine compared to baseline of 2.  Discussed with teaching service will see patient in the emergency department and admit.  Final Clinical Impressions(s) / ED Diagnoses   Final diagnoses:  Acute renal failure, unspecified acute renal failure type [facility](HCC)    ED Discharge Orders    None       Loren RacerYelverton, Florentine Diekman, MD 03/08/19 1756

## 2019-03-08 NOTE — ED Triage Notes (Signed)
Pt arrives to ED from home with complaints of lower left back pain for the last four days.

## 2019-03-08 NOTE — ED Notes (Signed)
ED TO INPATIENT HANDOFF REPORT  ED Nurse Name and Phone #: [phone]  S Name/Age/Gender Ryan Macdonald 85 y.o. male Room/Bed: 031C/031C  Code Status   Code Status: Full Code  Home/SNF/Other Home Patient oriented to: self, place, time and situation Is this baseline? Yes   Triage Complete: Triage complete  Chief Complaint Lower back pain & AKI  Triage Note Pt arrives to ED from home with complaints of lower left back pain for the last four days.    Allergies Allergies  Allergen Reactions  . Lisinopril Swelling    Facial swelling  . Xarelto [Rivaroxaban] Other (See Comments)    "Blood came out of my penis"    Level of Care/Admitting Diagnosis ED Disposition    ED Disposition Condition Comment   Admit  Hospital Area: [name] [[id number]]  Level of Care: Telemetry Medical [104]  Diagnosis: Acute renal failure (ARF) (HCC) [680321]  Admitting Physician: Silvio [name]  Attending Physician: HOFFMAN, ERIK C [[id number]]  PT Class (Do Not Modify): Observation [104]  PT Acc Code (Do Not Modify): Observation [10022]       B Medical/Surgery History Past Medical History:  Diagnosis Date  . Atrial fibrillation (HCC)   . Cardiomyopathy (HCC)   . Cat scratch fever    removed mass on right side neck  . Cataracts, bilateral   . CHF (congestive heart failure) (HCC)   . Chronic kidney disease   . Corns and callosities   . Diabetes mellitus without complication (HCC)    TYPE 2  . Dyspnea   . Erythema intertrigo   . Flat foot   . Gout   . High cholesterol   . Hx of urethral stricture   . Hypertension   . Kidney stones   . Morbid obesity (HCC)   . Nonischemic cardiomyopathy (HCC)    a. ? 2009 Cath in [place] - nl cors per pt;  b. 09/2013 Echo: EF 25-30%, sev diff HK.  . Obesity   . Osteoarthritis   . PAF (paroxysmal atrial fibrillation) (HCC)    a. post-op hip in 2014 - prev on xarelto.  . Renal insufficiency    Past Surgical History:  Procedure  Laterality Date  . CARDIAC CATHETERIZATION  1980's  . CIRCUMCISION  1940's  . CORONARY STENT INTERVENTION N/A 08/18/2018   Procedure: CORONARY STENT INTERVENTION;  Surgeon: Corky Crafts, MD;  Location: [facility] INVASIVE CV LAB;  Service: Cardiovascular;  Laterality: N/A;  . CYSTOSCOPY WITH URETHRAL DILATATION  10/23/2013  . CYSTOSCOPY WITH URETHRAL DILATATION N/A 10/23/2013   Procedure: CYSTOSCOPY WITH URETHRAL DILATATION;  Surgeon: Kathi Ludwig, MD;  Location: [facility] OR;  Service: Urology;  Laterality: N/A;  . INCISION AND DRAINAGE OF WOUND Right 1980's   "cat scratch" (10/23/2013)  . INGUINAL HERNIA REPAIR Right 1950's  . INTRAVASCULAR ULTRASOUND/IVUS N/A 08/18/2018   Procedure: Intravascular Ultrasound/IVUS;  Surgeon: Corky Crafts, MD;  Location: [facility] INVASIVE CV LAB;  Service: Cardiovascular;  Laterality: N/A;  . KIDNEY STONE SURGERY Right 1970's; 1983   "twice"  . LEFT HEART CATH AND CORONARY ANGIOGRAPHY N/A 08/18/2018   Procedure: LEFT HEART CATH AND CORONARY ANGIOGRAPHY;  Surgeon: Corky Crafts, MD;  Location: [facility] INVASIVE CV LAB;  Service: Cardiovascular;  Laterality: N/A;  . TONSILLECTOMY AND ADENOIDECTOMY  1940's  . TOTAL HIP ARTHROPLASTY Left 10/23/2013   Procedure: TOTAL HIP ARTHROPLASTY;  Surgeon: Valeria Batman, MD;  Location: [facility] OR;  Service: Orthopedics;  Laterality: Left;     A  IV Location/Drains/Wounds Patient Lines/Drains/Airways Status   Active Line/Drains/Airways    Name:   Placement date:   Placement time:   Site:   Days:   Peripheral IV 03/08/19 Left Forearm   03/08/19    1748    Forearm   less than 1          Intake/Output Last 24 hours No intake or output data in the 24 hours ending 03/08/19 1804  Labs/Imaging Results for orders placed or performed during the hospital encounter of 03/08/19 (from the past 48 hour(s))  CBC with Differential/Platelet     Status: Abnormal   Collection Time: 03/08/19  3:59 PM  Result Value Ref Range   WBC  5.5 4.0 - 10.5 K/uL   RBC 3.54 (L) 4.22 - 5.81 MIL/uL   Hemoglobin 10.5 (L) 13.0 - 17.0 g/dL   HCT 65.7 (L) 90.3 - 83.3 %   MCV 93.2 80.0 - 100.0 fL   MCH 29.7 26.0 - 34.0 pg   MCHC 31.8 30.0 - 36.0 g/dL   RDW 38.3 (H) 29.1 - 91.6 %   Platelets 244 150 - 400 K/uL   nRBC 0.0 0.0 - 0.2 %   Neutrophils Relative % 52 %   Neutro Abs 2.9 1.7 - 7.7 K/uL   Lymphocytes Relative 26 %   Lymphs Abs 1.4 0.7 - 4.0 K/uL   Monocytes Relative 18 %   Monocytes Absolute 1.0 0.1 - 1.0 K/uL   Eosinophils Relative 3 %   Eosinophils Absolute 0.2 0.0 - 0.5 K/uL   Basophils Relative 1 %   Basophils Absolute 0.0 0.0 - 0.1 K/uL   Immature Granulocytes 0 %   Abs Immature Granulocytes 0.01 0.00 - 0.07 K/uL    Comment: Performed at [facility], [address], [place], [place] [address]  Comprehensive metabolic panel     Status: Abnormal   Collection Time: 03/08/19  3:59 PM  Result Value Ref Range   Sodium 137 135 - 145 mmol/L   Potassium 5.3 (H) 3.5 - 5.1 mmol/L   Chloride 102 98 - 111 mmol/L   CO2 23 22 - 32 mmol/L   Glucose, Bld 50 (L) 70 - 99 mg/dL   BUN 64 (H) 8 - 23 mg/dL   Creatinine, Ser 4.59 (H) 0.61 - 1.24 mg/dL   Calcium 9.0 8.9 - 97.7 mg/dL   Total Protein 7.2 6.5 - 8.1 g/dL   Albumin 2.9 (L) 3.5 - 5.0 g/dL   AST 93 (H) 15 - 41 U/L   ALT 71 (H) 0 - 44 U/L   Alkaline Phosphatase 75 38 - 126 U/L   Total Bilirubin 0.8 0.3 - 1.2 mg/dL   GFR calc non Af Amer 12 (L) >60 mL/min   GFR calc Af Amer 14 (L) >60 mL/min   Anion gap 12 5 - 15    Comment: Performed at [facility], [address] [place], [place] [address]   Ct Renal Stone Study  Result Date: 03/08/2019 CLINICAL DATA:  Flank pain, low left back, for 4 days EXAM: CT ABDOMEN AND PELVIS WITHOUT CONTRAST TECHNIQUE: Multidetector CT imaging of the abdomen and pelvis was performed following the standard protocol without IV contrast. COMPARISON:  None. FINDINGS: LOWER CHEST: There is no basilar pleural or apical pericardial  effusion. There are coronary artery calcifications. HEPATOBILIARY: The hepatic contours and density are normal. There is no intra- or extrahepatic biliary dilatation. The gallbladder is normal. PANCREAS: The pancreatic parenchymal contours are normal and there  is no ductal dilatation. There is no peripancreatic fluid collection. SPLEEN: Normal. ADRENALS/URINARY TRACT: --Adrenal glands: Normal. --Right kidney/ureter: The right kidney is atrophic. There are multiple calcifications, the largest of which measures 2.9 cm. No hydronephrosis. --Left kidney/ureter: No hydronephrosis, nephroureterolithiasis, perinephric stranding or solid renal mass. --Urinary bladder: Diverticulum off the right dome of the bladder. STOMACH/BOWEL: --Stomach/Duodenum: There is no hiatal hernia or other gastric abnormality. The duodenal course and caliber are normal. --Small bowel: No dilatation or inflammation. --Colon: The ileocecal junction is contained in a right posterolateral abdominal wall hernia. No evidence of obstruction. No focal colonic abnormality. --Appendix: Not visualized. No right lower quadrant inflammation or free fluid. VASCULAR/LYMPHATIC: There is aortic atherosclerosis without hemodynamically significant stenosis. No abdominal or pelvic lymphadenopathy. REPRODUCTIVE: There are calcifications within the normal-sized prostate. Symmetric seminal vesicles. MUSCULOSKELETAL. Multilevel degenerative disc disease and facet arthrosis. No bony spinal canal stenosis. Left hip arthroplasty. OTHER: None. IMPRESSION: 1. No acute abnormality of the abdomen or pelvis. 2. Right posterolateral abdominal wall hernia containing the ileocecal junction without evidence of obstruction or inflammation. 3. Atrophic right kidney with multiple nonobstructing calculi measuring up to 2.9 cm. 4. Coronary artery and Aortic Atherosclerosis (ICD10-I70.0). Electronically Signed   By: Deatra RobinsonKevin  Herman M.D.   On: 03/08/2019 15:45    Pending Labs Unresulted  Labs (From admission, onward)    Start     Ordered   03/09/19 0500  Basic metabolic panel  Tomorrow morning,   R     03/08/19 1755   03/09/19 0500  CBC  Tomorrow morning,   R     03/08/19 1755   03/08/19 1518  Urinalysis, Routine w reflex microscopic  Once,   R     03/08/19 1517          Vitals/Pain Today's Vitals   03/08/19 1545 03/08/19 1600 03/08/19 1630 03/08/19 1645  BP: (!) 102/54 (!) 105/54 (!) 107/53 (!) 101/57  Pulse: (!) 50 (!) 50 (!) 50 (!) 50  Resp: 12     Temp:      TempSrc:      SpO2: 91% 94% 91% 94%  Weight:      Height:      PainSc:        Isolation Precautions No active isolations  Medications Medications  enoxaparin (LOVENOX) injection 30 mg (has no administration in time range)  acetaminophen (TYLENOL) tablet 650 mg (has no administration in time range)    Or  acetaminophen (TYLENOL) suppository 650 mg (has no administration in time range)  senna-docusate (Senokot-S) tablet 1 tablet (has no administration in time range)  promethazine (PHENERGAN) tablet 12.5 mg (has no administration in time range)  sodium chloride 0.9 % bolus 500 mL (500 mLs Intravenous New Bag/Given 03/08/19 1749)  dextrose 50 % solution 25 mL (25 mLs Intravenous Given 03/08/19 1757)    Mobility walks Moderate fall risk   Focused Assessment  Renal Assessment Handoff:  Creatine 4.33 & BUN 64      R Recommendations: See Admitting Provider Note  Report given to:   Additional Notes: AKI. Hx of CHF. Minimal lower back pain.

## 2019-03-08 NOTE — Telephone Encounter (Signed)
Sent an e-mail to [employer] rep Joey and he replied saying that they they did not have patient listed in their database.  Called and spoke w/ pt about his implanted device. He believes he has a [employer] device. I will reach out to [employer] Rep [place] for device information.

## 2019-03-09 DIAGNOSIS — I472 Ventricular tachycardia: Secondary | ICD-10-CM | POA: Diagnosis not present

## 2019-03-09 DIAGNOSIS — I2510 Atherosclerotic heart disease of native coronary artery without angina pectoris: Secondary | ICD-10-CM | POA: Diagnosis present

## 2019-03-09 DIAGNOSIS — M1990 Unspecified osteoarthritis, unspecified site: Secondary | ICD-10-CM | POA: Diagnosis present

## 2019-03-09 DIAGNOSIS — E119 Type 2 diabetes mellitus without complications: Secondary | ICD-10-CM

## 2019-03-09 DIAGNOSIS — Z79899 Other long term (current) drug therapy: Secondary | ICD-10-CM | POA: Diagnosis not present

## 2019-03-09 DIAGNOSIS — E1122 Type 2 diabetes mellitus with diabetic chronic kidney disease: Secondary | ICD-10-CM | POA: Diagnosis present

## 2019-03-09 DIAGNOSIS — N183 Chronic kidney disease, stage 3 unspecified: Secondary | ICD-10-CM | POA: Diagnosis present

## 2019-03-09 DIAGNOSIS — G8929 Other chronic pain: Secondary | ICD-10-CM | POA: Diagnosis present

## 2019-03-09 DIAGNOSIS — I480 Paroxysmal atrial fibrillation: Secondary | ICD-10-CM | POA: Diagnosis present

## 2019-03-09 DIAGNOSIS — I5022 Chronic systolic (congestive) heart failure: Secondary | ICD-10-CM | POA: Diagnosis present

## 2019-03-09 DIAGNOSIS — N179 Acute kidney failure, unspecified: Secondary | ICD-10-CM | POA: Diagnosis present

## 2019-03-09 DIAGNOSIS — E7800 Pure hypercholesterolemia, unspecified: Secondary | ICD-10-CM | POA: Diagnosis present

## 2019-03-09 DIAGNOSIS — R740 Nonspecific elevation of levels of transaminase and lactic acid dehydrogenase [LDH]: Secondary | ICD-10-CM | POA: Diagnosis present

## 2019-03-09 DIAGNOSIS — F339 Major depressive disorder, recurrent, unspecified: Secondary | ICD-10-CM

## 2019-03-09 DIAGNOSIS — I130 Hypertensive heart and chronic kidney disease with heart failure and stage 1 through stage 4 chronic kidney disease, or unspecified chronic kidney disease: Secondary | ICD-10-CM | POA: Diagnosis present

## 2019-03-09 DIAGNOSIS — F329 Major depressive disorder, single episode, unspecified: Secondary | ICD-10-CM | POA: Diagnosis present

## 2019-03-09 DIAGNOSIS — Z7901 Long term (current) use of anticoagulants: Secondary | ICD-10-CM | POA: Diagnosis not present

## 2019-03-09 DIAGNOSIS — N400 Enlarged prostate without lower urinary tract symptoms: Secondary | ICD-10-CM | POA: Diagnosis present

## 2019-03-09 DIAGNOSIS — R7401 Elevation of levels of liver transaminase levels: Secondary | ICD-10-CM | POA: Diagnosis present

## 2019-03-09 DIAGNOSIS — I428 Other cardiomyopathies: Secondary | ICD-10-CM | POA: Diagnosis present

## 2019-03-09 DIAGNOSIS — Z7984 Long term (current) use of oral hypoglycemic drugs: Secondary | ICD-10-CM | POA: Diagnosis not present

## 2019-03-09 DIAGNOSIS — M109 Gout, unspecified: Secondary | ICD-10-CM | POA: Diagnosis present

## 2019-03-09 DIAGNOSIS — R109 Unspecified abdominal pain: Secondary | ICD-10-CM | POA: Diagnosis present

## 2019-03-09 DIAGNOSIS — E875 Hyperkalemia: Secondary | ICD-10-CM | POA: Diagnosis present

## 2019-03-09 DIAGNOSIS — E860 Dehydration: Secondary | ICD-10-CM | POA: Diagnosis present

## 2019-03-09 DIAGNOSIS — M549 Dorsalgia, unspecified: Secondary | ICD-10-CM | POA: Diagnosis present

## 2019-03-09 DIAGNOSIS — J9611 Chronic respiratory failure with hypoxia: Secondary | ICD-10-CM | POA: Diagnosis present

## 2019-03-09 LAB — GLUCOSE, CAPILLARY
Glucose-Capillary: 59 mg/dL — ABNORMAL LOW (ref 70–99)
Glucose-Capillary: 93 mg/dL (ref 70–99)
Glucose-Capillary: 91 mg/dL (ref 70–99)
Glucose-Capillary: 98 mg/dL (ref 70–99)
Glucose-Capillary: 134 mg/dL — ABNORMAL HIGH (ref 70–99)

## 2019-03-09 LAB — HEMOGLOBIN A1C
Hgb A1c MFr Bld: 6.1 % — ABNORMAL HIGH (ref 4.8–5.6)
Mean Plasma Glucose: 128.37 mg/dL

## 2019-03-09 LAB — BASIC METABOLIC PANEL
Anion gap: 9 (ref 5–15)
BUN: 60 mg/dL — ABNORMAL HIGH (ref 8–23)
CO2: 25 mmol/L (ref 22–32)
Calcium: 8.8 mg/dL — ABNORMAL LOW (ref 8.9–10.3)
Chloride: 102 mmol/L (ref 98–111)
Creatinine, Ser: 3.79 mg/dL — ABNORMAL HIGH (ref 0.61–1.24)
GFR calc Af Amer: 16 mL/min — ABNORMAL LOW (ref >60)
GFR calc non Af Amer: 14 mL/min — ABNORMAL LOW (ref >60)
Glucose, Bld: 79 mg/dL (ref 70–99)
Potassium: 4.7 mmol/L (ref 3.5–5.1)
Sodium: 136 mmol/L (ref 135–145)

## 2019-03-09 LAB — CBC
HCT: 30.8 % — ABNORMAL LOW (ref 39.0–52.0)
Hemoglobin: 10.1 g/dL — ABNORMAL LOW (ref 13.0–17.0)
MCH: 30.5 pg (ref 26.0–34.0)
MCHC: 32.8 g/dL (ref 30.0–36.0)
MCV: 93.1 fL (ref 80.0–100.0)
Platelets: 215 10*3/uL (ref 150–400)
RBC: 3.31 MIL/uL — ABNORMAL LOW (ref 4.22–5.81)
RDW: 18.2 % — ABNORMAL HIGH (ref 11.5–15.5)
WBC: 5.5 10*3/uL (ref 4.0–10.5)
nRBC: 0 % (ref 0.0–0.2)

## 2019-03-09 LAB — SODIUM, URINE, RANDOM: Sodium, Ur: 76 mmol/L

## 2019-03-09 LAB — OSMOLALITY, URINE: Osmolality, Ur: 322 mOsm/kg (ref 300–900)

## 2019-03-09 MED ORDER — HYDROCODONE-ACETAMINOPHEN 5-325 MG PO TABS
1.0000 | ORAL_TABLET | Freq: Four times a day (QID) | ORAL | Status: DC | PRN
  Administered 2019-03-09 – 2019-03-10 (×5): 2 via ORAL
  Filled 2019-03-09 (×6): qty 2

## 2019-03-09 MED ORDER — LACTATED RINGERS IV SOLN
INTRAVENOUS | Status: AC
  Administered 2019-03-09: 08:00:00 via INTRAVENOUS

## 2019-03-09 MED ORDER — SERTRALINE HCL 50 MG PO TABS
25.0000 mg | ORAL_TABLET | Freq: Every day | ORAL | Status: DC
  Administered 2019-03-09 – 2019-03-11 (×3): 25 mg via ORAL
  Filled 2019-03-09 (×3): qty 1

## 2019-03-09 MED ORDER — DICLOFENAC SODIUM 1 % TD GEL
2.0000 g | Freq: Four times a day (QID) | TRANSDERMAL | Status: DC
  Administered 2019-03-09 – 2019-03-11 (×5): 2 g via TOPICAL
  Filled 2019-03-09: qty 100

## 2019-03-09 NOTE — Progress Notes (Signed)
Inpatient Diabetes Program Recommendations  AACE/ADA: New Consensus Statement on Inpatient Glycemic Control (2015)  Target Ranges:  Prepandial:   less than 140 mg/dL      Peak postprandial:   less than 180 mg/dL (1-2 hours)      Critically ill patients:  140 - 180 mg/dL   Lab Results  Component Value Date   GLUCAP 91 03/09/2019   HGBA1C 6.1 (H) 03/09/2019   Results for OLGA, NORSWORTHY (MRN [id number]) as of 03/09/2019 11:51  Ref. Range 03/08/2019 18:22 03/09/2019 03:43 03/09/2019 04:33 03/09/2019 11:59  Glucose-Capillary Latest Ref Range: 70 - 99 mg/dL 96 59 (L) 93 91    Upon admit lab glucose 03/08/2019 at 1559 was 50mg /dl and treated with 05LZ of D50.    Review of Glycemic Control  Diabetes history: DM2 Outpatient Diabetes medications: Glipizide 5 mg daily  Current orders for Inpatient glycemic control: Novolog moderate correction (0-15 units) tid   Diabetes coordinator working remotely and spoke to patient via telephone. Per progress notes patient with intentional weight loss of approximately 30 lbs since last November. Discussed with patient his low CBG of 50mg /dl upon admission and inquired whether he had lows at home. He states it is usually 112-116 mg/dl and never has lows like when he was admitted. He states he wasn't feeling good and had not eaten that day. 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 He states he and his wife are both diabetic and "look out for each other". The MD that manages his diabetes is at the [place] and he is not sure when his next appointment his. Recommended patient write down what his blood glucose is every am and to call MD if he is noticing trends of blood glucose decreasing. Patient verbalized understanding.   MD please consider the following inpatient insulin adjustments:  Decrease Novolog correction scale to SENSITIVE scale (0-9 units) tid.   -- Will follow during hospitalization.--  Jamelle Rushing RN, MSN Diabetes  Coordinator Inpatient Glycemic Control Team Team Pager: [phone] (8am-5pm)

## 2019-03-09 NOTE — Progress Notes (Signed)
   Subjective: Ryan Macdonald reports that he was doing okay, he stated that he is still having some back pain and was asking for something to help with that. He denied any other complaints.  He reported that he was not taking any weight loss supplements. He was concerned about his weight loss at first but states that he had changed his diet and has not been eating as much recently. He states that after he stopped eating so much his appetite has decreased. He reports that he feels better with the weight loss.   We discussed that we think that the back pain is from his muscles and that it does not seem to be related to his kidneys. We discussed that his labs have improved today. Discussed the plan for today and he is in agreement.   Objective:  Vital signs in last 24 hours: Vitals:   03/08/19 1815 03/08/19 1846 03/08/19 2009 03/09/19 0432  BP: 140/64 125/60 116/60 124/63  Pulse: (!) 50 (!) 53 (!) 51 (!) 57  Resp:  18 19 18   Temp:  97.7 F (36.5 C) 98 F (36.7 C) 97.9 F (36.6 C)  TempSrc:  Oral Oral Oral  SpO2: 96% 98% 97% 98%  Weight:   94.3 kg   Height:        General: Well appearing male, NAD, laying comfortably in bed Cardiac: RRR, no m/r/g Pulmonary: CTAB, normal WOB, normal O2 sats on room air Abdomen:  Soft, non-tender, non-distended, normoactive BS GU: No CVA tenderness bilaterally MSK: No paraspinal muscle or spinal tenderness. Extremity:No LE edema, erythema, or warmth noted bilaterally Psychiatry: Normal mood and affect    Assessment/Plan:  Active Problems:   Acute on chronic renal failure (HCC)  Ryan Macdonald is a 85 year old male with systolic heart failure s/p ICD, A. fib on Eliquis, T2DM, CAD s/p pLAD, and chronic hypoxic respiratory failure with supplemental oxygen as needed who presented with left flank pain and found to have acute renal failure.  Acute renal failure: His creatinine has improved with IV fluids suggesting that his ARF may be due to a prerenal  etiology such as decreased p.o. intake.  Creatinine 4.33->3.79.  Renal ultrasound showed atrophic right kidney with multiple nonobstructive colliculi.  No hydronephrosis noted.  There is also 2 simple left renal cysts noted in the left kidney.  Findings are unlikely to be contributing to his ARF. - Continue IV fluids - Follow urine output - Continue finasteride 5 mg daily  Left flank pain: Likely musculoskeletal in nature. - Continue Tylenol 650 mg every 6 hours as needed pain - Heat and Voltaren gel therapy  Hyperkalemia: Resolved.  Chronic systolic heart failure s/p ICD: He remains euvolemic on exam. -Continue spironolactone 12.5 mg daily -Continue isosorbide 10 mg daily -Continuous cardiac monitoring  Paroxysmal atrial fibrillation: He remains bradycardic in the 50s.  Continue holding home metoprolol and digoxin. -Continue apixaban 2.5 mg twice daily -Continue amiodarone 200 mg daily  Transaminitis: Unclear etiology at this point. - Hepatitis serologies pending. - Will get CMP in the morning.  Major depressive disorder: I am concerned that his lack of appetite may be due to untreated MDD.  He was previously on sertraline.  Believe it is reasonable to restart this medication. - Start sertraline 25 mg daily  FEN: Heart healthy, IV fluids at 50 cc/h VTE ppx: Home apixaban Code Status: FULL   Dispo: Anticipated discharge in approximately 1 to 2 days.  Synetta Shadow, MD 03/09/2019, 8:13 AM Pager: [phone]

## 2019-03-09 NOTE — Progress Notes (Signed)
Patient states pain is still 8 of 10 after taking pain medications ordered.  Called Dr. Criss Alvine and let her know the patient was still in pain.  Shanna Cisco

## 2019-03-09 NOTE — Progress Notes (Signed)
Hypoglycemic Event  CBG: 59  Treatment: 8 oz juice/soda  Symptoms: None  Follow-up CBG: Time:0433 CBG Result:93  Possible Reasons for Event: Unknown  Comments/MD notified: NA    Rolland Porter

## 2019-03-09 NOTE — Discharge Summary (Signed)
Name: Ryan CoonFred Macdonald MRN: [id number] DOB: 11-11-1934 85 y.o. [facility]  Date of Admission: 03/08/2019  2:30 PM Date of Discharge: 03/11/19 Attending Physician: Gust RungHoffman, Erik C, DO  Discharge Diagnosis: 1. Acute renal failure secondary to dehydration 2. Transaminitis     Discharge Medications: Allergies as of 03/11/2019      Reactions   Lisinopril Swelling   Facial swelling   Xarelto [rivaroxaban] Other (See Comments)   "Blood came out of my penis"      Medication List    TAKE these medications   allopurinol 100 MG tablet Commonly known as:  ZYLOPRIM Take 200 mg by mouth daily as needed (for gout symptoms).   amiodarone 200 MG tablet Commonly known as:  PACERONE Take 1 tablet (200 mg total) by mouth daily.   apixaban 2.5 MG Tabs tablet Commonly known as:  ELIQUIS Take 1 tablet (2.5 mg total) by mouth 2 (two) times daily. What changed:  additional instructions   cyanocobalamin 1000 MCG tablet Take 1,000 mcg by mouth daily.   diclofenac sodium 1 % Gel Commonly known as:  VOLTAREN Apply 4 g topically 4 (four) times daily.   docusate sodium 100 MG capsule Commonly known as:  COLACE Take 100 mg by mouth 2 (two) times daily.   finasteride 5 MG tablet Commonly known as:  PROSCAR Take 1 tablet (5 mg total) by mouth daily.   fluticasone furoate-vilanterol 100-25 MCG/INH Aepb Commonly known as:  BREO ELLIPTA Inhale 1 puff into the lungs daily.   glipiZIDE 5 MG tablet Commonly known as:  GLUCOTROL Take 5 mg by mouth daily before breakfast.   hydrALAZINE 25 MG tablet Commonly known as:  APRESOLINE Take 0.5 tablets (12.5 mg total) by mouth every 8 (eight) hours.   HYDROcodone-acetaminophen 5-325 MG tablet Commonly known as:  NORCO/VICODIN Take 2 tablets by mouth every 4 (four) hours as needed. What changed:  reasons to take this   ipratropium-albuterol 0.5-2.5 (3) MG/3ML Soln Commonly known as:  DUONEB Take 3 mLs by nebulization every 4  (four) hours as needed.   isosorbide mononitrate 30 MG 24 hr tablet Commonly known as:  IMDUR Take 0.5 tablets (15 mg total) by mouth daily.   ketoconazole 2 % shampoo Commonly known as:  NIZORAL Apply 1 application topically 2 (two) times a week.   magnesium oxide 400 MG tablet Commonly known as:  MAG-OX Take 400 mg by mouth daily.   nitroGLYCERIN 0.4 MG SL tablet Commonly known as:  NITROSTAT Place 1 tablet (0.4 mg total) under the tongue every 5 (five) minutes x 3 doses as needed for chest pain.   potassium chloride SA 20 MEQ tablet Commonly known as:  K-DUR,KLOR-CON Take 20 mEq by mouth daily.   rosuvastatin 10 MG tablet Commonly known as:  CRESTOR Take 1 tablet (10 mg total) by mouth daily at 6 PM.   sertraline 25 MG tablet Commonly known as:  ZOLOFT Take 25 mg by mouth daily.   spironolactone 25 MG tablet Commonly known as:  ALDACTONE Take 0.5 tablets (12.5 mg total) by mouth daily.   torsemide 20 MG tablet Commonly known as:  DEMADEX Take 2 tablets (40 mg total) by mouth daily. What changed:  when to take this       Disposition and follow-up:   Ryan Macdonald was [facility] in Good [facility].  At the hospital follow up visit please address:  1.  Acute renal failure: the patient's torsemide was changed form  40mg  bid to 40mg  qd. Please assess volume status and recheck creatinine level.  2.  Labs / imaging needed at time of follow-up: CMP to follow up on transaminitis and creatinine level  3.  Pending labs/ test needing follow-up: none  Follow-up Appointments: Follow-up Information    [facility]. Schedule an appointment as soon as possible for a visit in 1 week(s).   Specialty:  General Practice Contact information: [address] [place] [address] [phone]        [name], MD .   Specialty:  Cardiology Contact information: [address] [place] [address] [place] [address] [phone]         Laurey Morale, MD. Schedule an appointment as soon as possible for a visit in 1 week(s).   Specialty:  Cardiology Contact information: [address] [place] [address] [phone]           Hospital Course by problem list:  Acute renal failure  Patient presented with left flank pain and noted to have a creatinine elevation to 4.3 which was elevated from baseline ranging 1.9-2.2. The patient has had an approximately 30-40lb weight loss over the past 4 months that he said is intentional with lifestyle modification (dietary). The patient's renal failure was thought to be due to dehydration and use of diuretics. He was given gentle diuresis during hospitalization. Patient's torsemide was changed from 40mg  bid to 40mg  qd. On day of discharge his creatinine was 2.89.   Left flank pain  No thoracic or lumbar spinal tenderness of paraspinal pain. Thought to be musculoskeletal in etiology. On day of discharge the patient mentioned that he did not have any pain.   Transaminitis  Patient's ast was 93 and alt 71 on day of admission but it improved to 72 and 57. Hepatitis serologies were ordered: hepatitis b c igm neg, hcv ab 0.1, hep b s ag 136.4, hep b s ag negative. It is possible that transaminitis was also due to non alcoholic fatty liver disease. He will need further workup on this as an outpatient.   Discharge Vitals:   BP (!) 108/57 (BP Location: Right Arm)   Pulse 63   Temp 98 F (36.7 C) (Oral)   Resp 19   Ht 5\' 7"  (1.702 m)   Wt 90.1 kg   SpO2 95%   BMI 31.11 kg/m   Pertinent Labs, Studies, and Procedures:   CMP Latest Ref Rng & Units 03/11/2019 03/10/2019 03/09/2019  Glucose 70 - 99 mg/dL 98 93 79  BUN 8 - 23 mg/dL 30(S) 92(Z) 30(Q)  Creatinine 0.61 - 1.24 mg/dL 7.62(U) 6.33(H) 5.45(G)  Sodium 135 - 145 mmol/L 137 137 136  Potassium 3.5 - 5.1 mmol/L 4.1 5.4(H) 4.7  Chloride 98 - 111 mmol/L 100 101 102  CO2 22 - 32 mmol/L 26 26 25   Calcium 8.9 - 10.3  mg/dL 2.5(W) 9.1 3.8(L)  Total Protein 6.5 - 8.1 g/dL - 6.4(L) -  Total Bilirubin 0.3 - 1.2 mg/dL - 0.8 -  Alkaline Phos 38 - 126 U/L - 69 -  AST 15 - 41 U/L - 72(H) -  ALT 0 - 44 U/L - 57(H) -    Discharge Instructions: Discharge Instructions    (HEART FAILURE PATIENTS) Call MD:  Anytime you have any of the following symptoms: 1) 3 pound weight gain in 24 hours or 5 pounds in 1 week 2) shortness of breath, with or without a dry hacking cough 3) swelling in the hands,  feet or stomach 4) if you have to sleep on extra pillows at night in order to breathe.   Complete by:  As directed    Call MD for:  persistant dizziness or light-headedness   Complete by:  As directed    Call MD for:  persistant nausea and vomiting   Complete by:  As directed    Diet - low sodium heart healthy   Complete by:  As directed    Discharge instructions   Complete by:  As directed    It was a pleasure to take care of you Ryan Macdonald. You were hospitalized for kidney failure. You were given fluids and your torsemide medication was changed from 40mg  twice dialy to 40mg  once daily. Please make sure to follow up with the heart failure team next week. Thank you!   Increase activity slowly   Complete by:  As directed       Signed: Lorenso Courier, MD 03/11/2019, 2:46 PM   Pager: [phone]

## 2019-03-09 NOTE — Discharge Instructions (Signed)
Make sure to eat food when you take your Glipizide 5mg  daily. Continue to check your CBG 1-2 times a day, write it down, and take paper with you to your next appointment at the [place]. Call your PCP if blood sugar is running low.      Information on my medicine - ELIQUIS (apixaban)---- You were taking this medication prior to this hospital admission.   This medication education was reviewed with me or my healthcare representative as part of my discharge preparation.   You were taking this medication prior to this hospital admission.  Why was Eliquis prescribed for you? Eliquis was prescribed for you to reduce the risk of a blood clot forming that can cause a stroke if you have a medical condition called atrial fibrillation (a type of irregular heartbeat).  What do You need to know about Eliquis ? Take your Eliquis TWICE DAILY - one tablet in the morning and one tablet in the evening with or without food. If you have difficulty swallowing the tablet whole please discuss with your pharmacist how to take the medication safely.  Take Eliquis exactly as prescribed by your doctor and DO NOT stop taking Eliquis without talking to the doctor who prescribed the medication.  Stopping may increase your risk of developing a stroke.  Refill your prescription before you run out.  After discharge, you should have regular check-up appointments with your healthcare provider that is prescribing your Eliquis.  In the future your dose may need to be changed if your kidney function or weight changes by a significant amount or as you get older.  What do you do if you miss a dose? If you miss a dose, take it as soon as you remember on the same day and resume taking twice daily.  Do not take more than one dose of ELIQUIS at the same time to make up a missed dose.  Important Safety Information A possible side effect of Eliquis is bleeding. You should call your healthcare provider right away if you experience any  of the following: ? Bleeding from an injury or your nose that does not stop. ? Unusual colored urine (red or dark brown) or unusual colored stools (red or black). ? Unusual bruising for unknown reasons. ? A serious fall or if you hit your head (even if there is no bleeding).  Some medicines may interact with Eliquis and might increase your risk of bleeding or clotting while on Eliquis. To help avoid this, consult your healthcare provider or pharmacist prior to using any new prescription or non-prescription medications, including herbals, vitamins, non-steroidal anti-inflammatory drugs (NSAIDs) and supplements.  This website has more information on Eliquis (apixaban): http://[email or web address].com/eliquis/home

## 2019-03-10 LAB — HCV COMMENT:

## 2019-03-10 LAB — COMPREHENSIVE METABOLIC PANEL
ALT: 57 U/L — ABNORMAL HIGH (ref 0–44)
AST: 72 U/L — ABNORMAL HIGH (ref 15–41)
Albumin: 2.5 g/dL — ABNORMAL LOW (ref 3.5–5.0)
Alkaline Phosphatase: 69 U/L (ref 38–126)
Anion gap: 10 (ref 5–15)
BUN: 47 mg/dL — ABNORMAL HIGH (ref 8–23)
CO2: 26 mmol/L (ref 22–32)
Calcium: 9.1 mg/dL (ref 8.9–10.3)
Chloride: 101 mmol/L (ref 98–111)
Creatinine, Ser: 2.89 mg/dL — ABNORMAL HIGH (ref 0.61–1.24)
GFR calc Af Amer: 22 mL/min — ABNORMAL LOW (ref >60)
GFR calc non Af Amer: 19 mL/min — ABNORMAL LOW (ref >60)
Glucose, Bld: 93 mg/dL (ref 70–99)
Potassium: 5.4 mmol/L — ABNORMAL HIGH (ref 3.5–5.1)
Sodium: 137 mmol/L (ref 135–145)
Total Bilirubin: 0.8 mg/dL (ref 0.3–1.2)
Total Protein: 6.4 g/dL — ABNORMAL LOW (ref 6.5–8.1)

## 2019-03-10 LAB — URINE CULTURE: Culture: NO GROWTH

## 2019-03-10 LAB — HEPATITIS C ANTIBODY (REFLEX): HCV Ab: 0.1 s/co ratio (ref 0.0–0.9)

## 2019-03-10 LAB — HEPATITIS B CORE ANTIBODY, IGM: Hep B C IgM: NEGATIVE

## 2019-03-10 LAB — GLUCOSE, CAPILLARY
Glucose-Capillary: 73 mg/dL (ref 70–99)
Glucose-Capillary: 73 mg/dL (ref 70–99)
Glucose-Capillary: 103 mg/dL — ABNORMAL HIGH (ref 70–99)
Glucose-Capillary: 137 mg/dL — ABNORMAL HIGH (ref 70–99)
Glucose-Capillary: 129 mg/dL — ABNORMAL HIGH (ref 70–99)

## 2019-03-10 LAB — HEPATITIS B SURFACE ANTIBODY, QUANTITATIVE: Hep B S AB Quant (Post): 136.4 m[IU]/mL (ref >9.9)

## 2019-03-10 LAB — HEPATITIS B SURFACE ANTIGEN: Hepatitis B Surface Ag: NEGATIVE

## 2019-03-10 LAB — MAGNESIUM: Magnesium: 1.6 mg/dL — ABNORMAL LOW (ref 1.7–2.4)

## 2019-03-10 MED ORDER — LACTATED RINGERS IV SOLN
INTRAVENOUS | Status: AC
  Administered 2019-03-10 – 2019-03-11 (×2): via INTRAVENOUS

## 2019-03-10 MED ORDER — SODIUM POLYSTYRENE SULFONATE 15 GM/60ML PO SUSP
30.0000 g | Freq: Once | ORAL | Status: AC
  Administered 2019-03-10: 30 g via ORAL
  Filled 2019-03-10: qty 120

## 2019-03-10 NOTE — Plan of Care (Signed)
  Problem: Clinical Measurements: Goal: Ability to maintain clinical measurements within normal limits will improve Outcome: Progressing   Problem: Clinical Measurements: Goal: Diagnostic test results will improve Outcome: Progressing   Problem: Clinical Measurements: Goal: Cardiovascular complication will be avoided Outcome: Progressing   Problem: Activity: Goal: Risk for activity intolerance will decrease Outcome: Progressing   Problem: Nutrition: Goal: Adequate nutrition will be maintained Outcome: Progressing   Problem: Pain Managment: Goal: General experience of comfort will improve Outcome: Progressing   Problem: Safety: Goal: Ability to remain free from injury will improve Outcome: Progressing   Problem: Skin Integrity: Goal: Risk for impaired skin integrity will decrease Outcome: Progressing

## 2019-03-10 NOTE — Progress Notes (Signed)
   Subjective:   Mr. Tennenbaum was seen resting comfortably in his bed this morning. He denied having any nausea, vomiting, abdominal pain. He states he still has some back pain and the medication does not last 8 hours.   Objective:  Vital signs in last 24 hours: Vitals:   03/09/19 1641 03/09/19 2010 03/10/19 0310 03/10/19 0510  BP: 125/64 136/72 123/60 113/62  Pulse: (!) 49 (!) 50  (!) 52  Resp: 18 19  19   Temp: 98.1 F (36.7 C) 97.8 F (36.6 C)  97.9 F (36.6 C)  TempSrc: Oral Oral  Oral  SpO2: 100% 97%  93%  Weight:  92.1 kg    Height:       Physical Exam  Constitutional: He is oriented to person, place, and time. He appears well-developed and well-nourished. No distress.  HENT:  Head: Normocephalic and atraumatic.  Eyes: Conjunctivae are normal.  Cardiovascular: Normal rate, regular rhythm and normal heart sounds.  Respiratory: Effort normal and breath sounds normal. No respiratory distress. He has no wheezes.  GI: Soft. Bowel sounds are normal. He exhibits no distension. There is no abdominal tenderness.  Musculoskeletal:        General: Tenderness (left lower back) present.  Neurological: He is alert and oriented to person, place, and time.  Skin: He is not diaphoretic. No erythema.  Psychiatric: He has a normal mood and affect. His behavior is normal. Judgment and thought content normal.   Assessment/Plan:  Mr. Grgurich is a 85 y.o m with systolic heart failure s/p icd, afib on eliquis, dm2, cad s/p pLAD who presented with left flank pain and acute renal failure.   Acute renal failure Thought to be pre-renal etiology. Cr has improved to 2.89 from 3.79. Will keep overnight and continue to give gentle hydration as the patient has risk for developing acute renal failure again if not adequately treated.   Hyperkalemia  Patient's k=5.4 in setting of acute renal failure.   Left flank pain  Thought to be msk etiology.  -voltaren gel  -heat to area  -continue home  norco 5-325mg  1-2 tablets q4hrs prn -physical therapy  Transaminitis Patient's ast/alt have decreased to 72 and 57 respectively from 93 and 71.  -Hepatitis serologies pending  Chronic systolic heart failure s/p ICD  He has documented uop of -2.1L. Weight not documented from today.   -continue spironolactone 12/5mg  qd  -continue imdur 15mg  qd  Paroxysmal atrial fibrillation  Patient's rates range 50-59.  -continue apixaban 2.5mg  bid  -continue amiodarone 200mg  qd  Major depressive disorder  -continue sertraline 25mg  qd   Dispo: Anticipated discharge in approximately 1-2 day(s).   Lorenso Courier, MD 03/10/2019, 7:15 AM Pager: [phone]

## 2019-03-10 NOTE — Progress Notes (Signed)
MD on call informed that patient had 3 beat run of non sustained VT patient was asymptomatic. Ilean Skill LPN

## 2019-03-11 DIAGNOSIS — E860 Dehydration: Secondary | ICD-10-CM

## 2019-03-11 LAB — BASIC METABOLIC PANEL
Anion gap: 11 (ref 5–15)
BUN: 35 mg/dL — ABNORMAL HIGH (ref 8–23)
CO2: 26 mmol/L (ref 22–32)
Calcium: 8.8 mg/dL — ABNORMAL LOW (ref 8.9–10.3)
Chloride: 100 mmol/L (ref 98–111)
Creatinine, Ser: 2.23 mg/dL — ABNORMAL HIGH (ref 0.61–1.24)
GFR calc Af Amer: 31 mL/min — ABNORMAL LOW (ref >60)
GFR calc non Af Amer: 26 mL/min — ABNORMAL LOW (ref >60)
Glucose, Bld: 98 mg/dL (ref 70–99)
Potassium: 4.1 mmol/L (ref 3.5–5.1)
Sodium: 137 mmol/L (ref 135–145)

## 2019-03-11 LAB — GLUCOSE, CAPILLARY
Glucose-Capillary: 88 mg/dL (ref 70–99)
Glucose-Capillary: 89 mg/dL (ref 70–99)

## 2019-03-11 MED ORDER — TORSEMIDE 20 MG PO TABS: 40.0000 mg | ORAL_TABLET | Freq: Every day | ORAL | 3 refills | Status: DC

## 2019-03-11 NOTE — Progress Notes (Signed)
DISCHARGE NOTE HOME Ryan Macdonald to be discharged home per MD order. Discussed prescriptions and follow up appointments with the patient. Prescriptions given to patient; medication list explained in detail. Patient verbalized understanding.  Skin clean, dry and intact without evidence of skin break down, no evidence of skin tears noted. IV catheter discontinued intact. Site without signs and symptoms of complications. Dressing and pressure applied. Pt denies pain at the site currently. No complaints noted.  Patient free of lines, drains, and wounds.   An After Visit Summary (AVS) was printed and given to the patient. Patient escorted via wheelchair, and discharged home via private auto.   Questions answered satisfactorily at the time of discharged.  [place], Kem Kays, Charity fundraiser.

## 2019-03-11 NOTE — Evaluation (Signed)
Physical Therapy Evaluation Patient Details Name: Ryan Macdonald MRN: [id number] DOB: 1934/03/29 Today's Date: 03/11/2019   History of Present Illness  Pt is a 85 y.o. M with significant PMH of systolic heart failure s/p ICD, afib, DM2, CAD who presents with left flank pain and acute renal failure  Clinical Impression  Pt admitted with above diagnosis. Pt currently with functional limitations due to the deficits listed below (see PT Problem List). Pt presenting with decreased mobility secondary to generalized weakness, decreased endurance, and back pain. Pt describes left low back pain as constant and is not tender to palpation. Ambulating in room with walker and supervision. Donning pants/socks/shoes with mod assist. Pt will benefit from skilled PT to increase their independence and safety with mobility to allow discharge to the venue listed below.       Follow Up Recommendations No PT follow up (pt declining)    Equipment Recommendations  None recommended by PT    Recommendations for Other Services       Precautions / Restrictions Precautions Precautions: Fall Restrictions Weight Bearing Restrictions: No      Mobility  Bed Mobility Overal bed mobility: Modified Independent             General bed mobility comments: increased time/effort  Transfers Overall transfer level: Needs assistance Equipment used: Rolling walker (2 wheeled) Transfers: Sit to/from Stand Sit to Stand: Supervision            Ambulation/Gait Ambulation/Gait assistance: Supervision Gait Distance (Feet): 15 Feet Assistive device: Rolling walker (2 wheeled) Gait Pattern/deviations: Step-through pattern;Decreased stride length;Shuffle   Gait velocity interpretation: <1.8 ft/sec, indicate of risk for recurrent falls General Gait Details: Pt walking in room with supervision, decreased bilateral foot clearance noted  Stairs            Wheelchair Mobility    Modified Rankin (Stroke  Patients Only)       Balance Overall balance assessment: Needs assistance Sitting-balance support: Feet supported Sitting balance-Leahy Scale: Good     Standing balance support: No upper extremity supported;During functional activity Standing balance-Leahy Scale: Fair                               Pertinent Vitals/Pain Pain Assessment: Faces Faces Pain Scale: Hurts little more Pain Location: left lower back Pain Descriptors / Indicators: Constant;Discomfort Pain Intervention(s): Monitored during session    Home Living Family/patient expects to be discharged to:: Private residence Living Arrangements: Spouse/significant other Available Help at Discharge: Family;Available 24 hours/day Type of Home: Apartment Home Access: Stairs to enter Entrance Stairs-Rails: Can reach both Entrance Stairs-Number of Steps: 4 Home Layout: One level Home Equipment: Bedside commode;Shower seat;Cane - single point;Grab bars - toilet;Walker - 2 wheels;Wheelchair - manual      Prior Function Level of Independence: Independent with assistive device(s)         Comments: Uses SPC and RW as needed; Pt drives, cooks, Conservation officer, historic buildings Dominance   Dominant Hand: Right    Extremity/Trunk Assessment   Upper Extremity Assessment Upper Extremity Assessment: Overall WFL for tasks assessed    Lower Extremity Assessment Lower Extremity Assessment: Generalized weakness    Cervical / Trunk Assessment Cervical / Trunk Assessment: Normal  Communication   Communication: No difficulties  Cognition Arousal/Alertness: Awake/alert Behavior During Therapy: WFL for tasks assessed/performed Overall Cognitive Status: Within Functional Limits for tasks assessed  General Comments      Exercises     Assessment/Plan    PT Assessment Patient needs continued PT services  PT Problem List Decreased strength;Decreased activity  tolerance;Decreased balance;Decreased mobility;Pain       PT Treatment Interventions Gait training;DME instruction;Functional mobility training;Therapeutic activities;Therapeutic exercise;Stair training;Balance training;Patient/family education    PT Goals (Current goals can be found in the Care Plan section)  Acute Rehab PT Goals Patient Stated Goal: "exercise myself." PT Goal Formulation: With patient Time For Goal Achievement: 03/25/19 Potential to Achieve Goals: Good    Frequency Min 3X/week   Barriers to discharge        Co-evaluation               AM-PAC PT "6 Clicks" Mobility  Outcome Measure Help needed turning from your back to your side while in a flat bed without using bedrails?: None Help needed moving from lying on your back to sitting on the side of a flat bed without using bedrails?: None Help needed moving to and from a bed to a chair (including a wheelchair)?: None Help needed standing up from a chair using your arms (e.g., wheelchair or bedside chair)?: None Help needed to walk in hospital room?: None Help needed climbing 3-5 steps with a railing? : A Little 6 Click Score: 23    End of Session   Activity Tolerance: Patient tolerated treatment well Patient left: in bed;with call bell/phone within reach Nurse Communication: Mobility status PT Visit Diagnosis: Muscle weakness (generalized) (M62.81);Difficulty in walking, not elsewhere classified (R26.2)    Time: 1610-9604 PT Time Calculation (min) (ACUTE ONLY): 26 min   Charges:   PT Evaluation $PT Eval Moderate Complexity: 1 Mod PT Treatments $Therapeutic Activity: 8-22 mins        Laurina Bustle, PT, DPT Acute Rehabilitation Services Pager [phone] Office [phone]  Ryan Macdonald 03/11/2019, 4:17 PM

## 2019-03-11 NOTE — Progress Notes (Addendum)
   Subjective:  Mr. Cavey reports that he was doing well today, no acute events overnight. He denies any new complaints. He was wondering when he would be going home. He reported that he weighs himself 2-3 times a day, he wants to watch for any fluid overload so that he doesn't need to come into the hospital. He reported that he is just not hungry.   He reports that his back pain is better today, still has some pain but he reports that he is able to roll over on it now. All questions were answered.   Patient had a 3beat run of nonsustained vtach last night that was not sustained.   Objective:  Vital signs in last 24 hours: Vitals:   03/10/19 0850 03/10/19 1659 03/10/19 2016 03/11/19 0403  BP: 111/65 131/65 120/72 119/72  Pulse: (!) 57 (!) 58 65 61  Resp: 19 18 (!) 21 20  Temp: (!) 97.4 F (36.3 C) 97.9 F (36.6 C) 97.8 F (36.6 C) 97.8 F (36.6 C)  TempSrc: Oral Oral Oral Oral  SpO2: 97% 97% 97% 95%  Weight:   90.1 kg   Height:       Physical Exam  Constitutional: He appears well-developed and well-nourished. No distress.  HENT:  Head: Normocephalic and atraumatic.  Cardiovascular: Normal rate, regular rhythm and normal heart sounds.  Respiratory: Effort normal and breath sounds normal. No respiratory distress. He has no wheezes.  GI: Soft. Bowel sounds are normal. He exhibits no distension. There is no abdominal tenderness.  Musculoskeletal:     Comments: No lower back pain to palpation  Skin: He is not diaphoretic.  Psychiatric: He has a normal mood and affect. His behavior is normal. Judgment and thought content normal.   Assessment/Plan:  Mr. Booth is a 85 y.o m with systolic heart failure s/p icd, afib on eliquis, dm2, cad s/p pLAD who presented with left flank pain and acute renal failure.   Acute renal failure Thought to be pre-renal etiology his cr has improved to 2.2 from 2.89. Patient's baseline cr levels appears to range 1.4-2.3.   -Patient was  counseled to continue to have oral intake and fluids to ensure that he does not go into renal failure again  -recommend home health following up with patient to help him with his pills  -changed torsemide 40mg  bid to 40mg  qd  Hyperkalemia  Resolved  Left flank pain  Thought to be msk etiology. Patient states that the pain is relieved  -voltaren gel  -heat to area  -continue home norco 5-325mg  1-2 tablets q4hrs prn -physical therapy  Transaminitis Will need repeat cmp outpatient.   Hepatitis serologies: Hep B c ab neg, hep b s ab 136.4, hep b s ag neg, hep c ab 0.1.   Chronic systolic heart failure s/p ICD  He has documented uop of -1.4L. Patient weighed 90.1kg on 4/11, not collected today.  -continue spironolactone 12/5mg  qd  -continue imdur 15mg  qd  Paroxysmal atrial fibrillation  Patient's rates range 50-60s.  -continue apixaban 2.5mg  bid  -continue amiodarone 200mg  qd  Major depressive disorder  -continue sertraline 25mg  qd   Dispo: Anticipated discharge in approximately today.  Lorenso Courier, MD 03/11/2019, 7:12 AM Pager: [phone]

## 2019-03-12 NOTE — Telephone Encounter (Signed)
I've asked Thayer Ohm with [facility] to help get patient released into our clinic.

## 2019-03-13 ENCOUNTER — Telehealth (HOSPITAL_COMMUNITY): Payer: Self-pay

## 2019-03-13 ENCOUNTER — Other Ambulatory Visit (HOSPITAL_COMMUNITY): Payer: Self-pay

## 2019-03-13 ENCOUNTER — Ambulatory Visit (HOSPITAL_COMMUNITY)
Admission: RE | Admit: 2019-03-13 | Discharge: 2019-03-13 | Disposition: A | Discharge status: Home / Self Care | Payer: Medicare Other | Source: Ambulatory Visit | Attending: Cardiology | Admitting: Cardiology

## 2019-03-13 ENCOUNTER — Other Ambulatory Visit: Payer: Self-pay

## 2019-03-13 DIAGNOSIS — I5022 Chronic systolic (congestive) heart failure: Secondary | ICD-10-CM

## 2019-03-13 DIAGNOSIS — I5023 Acute on chronic systolic (congestive) heart failure: Secondary | ICD-10-CM

## 2019-03-13 MED ORDER — HYDRALAZINE HCL 25 MG PO TABS: 25.0000 mg | ORAL_TABLET | Freq: Three times a day (TID) | ORAL | 3 refills | Status: DC

## 2019-03-13 MED ORDER — CYANOCOBALAMIN 1000 MCG PO TABS: 1000.0000 ug | ORAL_TABLET | Freq: Every day | ORAL | 0 refills | Status: DC

## 2019-03-13 NOTE — Progress Notes (Signed)
Paramedicine Encounter    Patient ID: Ryan Macdonald, male    DOB: 20-Aug-1934, 85 y.o.   MRN: [id number]   Patient Care Team: [facility] as PCP - General (General Practice) [name], MD as PCP - Cardiology (Cardiology) Laurey Morale, MD as PCP - Advanced Heart Failure (Cardiology)  Patient Active Problem List   Diagnosis Date Noted  . Acute renal failure (ARF) (HCC) 03/09/2019  . Transaminitis   . Acute on chronic renal failure (HCC) 03/08/2019  . Acute exacerbation of CHF (congestive heart failure) (HCC) 11/23/2018  . Acute on chronic heart failure (HCC) 10/19/2018  . Pulmonary emphysema (HCC)   . Chronic respiratory failure with hypoxia (HCC)   . Morbid obesity (HCC) 09/09/2018  . URI (upper respiratory infection) 08/31/2018  . UTI (urinary tract infection) 08/31/2018  . Chronic systolic (congestive) heart failure (HCC) 08/28/2018  . CHF (congestive heart failure) (HCC) 08/14/2018  . Acute on chronic combined systolic and diastolic CHF (congestive heart failure) (HCC) 07/18/2018  . First degree AV block   . Shortness of breath 04/29/2018  . Acute HFrEF (heart failure with reduced ejection fraction) (HCC) 04/29/2018  . Acute respiratory failure with hypoxia (HCC) 09/27/2017  . CHF exacerbation (HCC) 09/27/2017  . Renal insufficiency   . Acute on chronic systolic CHF (congestive heart failure) (HCC) 07/08/2017  . Left knee pain 07/08/2017  . Acute bronchitis 05/10/2017  . Acute congestive heart failure (HCC) 04/14/2017  . Elevated troponin 04/14/2017  . Lactic acidosis   . Acute on chronic systolic heart failure, NYHA class 2 (HCC) 02/03/2017  . Diabetes mellitus type 2 in obese (HCC) 02/03/2017  . CAP (community acquired pneumonia) 02/03/2017  . Lobar pneumonia (HCC)   . Diabetes mellitus with complication (HCC)   . Acute on chronic respiratory failure with hypoxia (HCC)   . Acute on chronic combined systolic and diastolic heart failure (HCC)  18/84/1660  . Depression 05/19/2016  . Gout 05/19/2016  . 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 NSVT (nonsustained ventricular tachycardia) (HCC)   . Chronic renal insufficiency, stage 3 (moderate) (HCC)   . Pulmonary embolism (HCC)   . Acute respiratory failure (HCC)   . Osteoarthritis of left hip 10/25/2013  . Atrial fibrillation (HCC) 10/25/2013  . S/P hip replacement 10/24/2013  . Nonischemic cardiomyopathy (HCC)   . Obesity (BMI 30-39.9)   . Essential hypertension   . Hyperlipidemia     Current Outpatient Medications:  .  allopurinol (ZYLOPRIM) 100 MG tablet, Take 200 mg by mouth daily as needed (for gout symptoms). , Disp: , Rfl:  .  amiodarone (PACERONE) 200 MG tablet, Take 1 tablet (200 mg total) by mouth daily., Disp: 30 tablet, Rfl: 3 .  apixaban (ELIQUIS) 2.5 MG TABS tablet, Take 1 tablet (2.5 mg total) by mouth 2 (two) times daily. (Patient taking differently: Take 2.5 mg by mouth 2 (two) times daily. Caution - blood thinner), Disp: 90 tablet, Rfl: 0 .  cyanocobalamin 1000 MCG tablet, Take 1,000 mcg by mouth daily. , Disp: , Rfl:  .  diclofenac sodium (VOLTAREN) 1 % GEL, Apply 4 g topically 4 (four) times daily., Disp: 1 Tube, Rfl: 0 .  docusate sodium (COLACE) 100 MG capsule, Take 100 mg by mouth 2 (two) times daily., Disp: , Rfl:  .  finasteride (PROSCAR) 5 MG tablet, Take 1 tablet (5 mg total) by mouth daily., Disp: 30 tablet, Rfl: 0 .  fluticasone furoate-vilanterol (BREO ELLIPTA) 100-25 MCG/INH AEPB, Inhale 1 puff into  the lungs daily. (Patient not taking: Reported on 03/08/2019), Disp: 60 each, Rfl: 3 .  glipiZIDE (GLUCOTROL) 5 MG tablet, Take 5 mg by mouth daily before breakfast., Disp: , Rfl:  .  hydrALAZINE (APRESOLINE) 25 MG tablet, Take 0.5 tablets (12.5 mg total) by mouth every 8 (eight) hours., Disp: 45 tablet, Rfl: 5 .  HYDROcodone-acetaminophen (NORCO/VICODIN) 5-325 MG tablet, Take 2 tablets by mouth every 4 (four) hours as needed.  (Patient taking differently: Take 2 tablets by mouth every 4 (four) hours as needed for moderate pain. ), Disp: 6 tablet, Rfl: 0 .  ipratropium-albuterol (DUONEB) 0.5-2.5 (3) MG/3ML SOLN, Take 3 mLs by nebulization every 4 (four) hours as needed. (Patient not taking: Reported on 03/08/2019), Disp: 360 mL, Rfl: 0 .  isosorbide mononitrate (IMDUR) 30 MG 24 hr tablet, Take 0.5 tablets (15 mg total) by mouth daily., Disp: 15 tablet, Rfl: 5 .  ketoconazole (NIZORAL) 2 % shampoo, Apply 1 application topically 2 (two) times a week., Disp: , Rfl:  .  magnesium oxide (MAG-OX) 400 MG tablet, Take 400 mg by mouth daily., Disp: , Rfl:  .  nitroGLYCERIN (NITROSTAT) 0.4 MG SL tablet, Place 1 tablet (0.4 mg total) under the tongue every 5 (five) minutes x 3 doses as needed for chest pain. (Patient not taking: Reported on 02/07/2019), Disp: 25 tablet, Rfl: 0 .  potassium chloride SA (K-DUR,KLOR-CON) 20 MEQ tablet, Take 20 mEq by mouth daily., Disp: , Rfl:  .  rosuvastatin (CRESTOR) 10 MG tablet, Take 1 tablet (10 mg total) by mouth daily at 6 PM., Disp: 30 tablet, Rfl: 5 .  sertraline (ZOLOFT) 25 MG tablet, Take 25 mg by mouth daily. , Disp: , Rfl:  .  spironolactone (ALDACTONE) 25 MG tablet, Take 0.5 tablets (12.5 mg total) by mouth daily., Disp: 30 tablet, Rfl: 6 .  torsemide (DEMADEX) 20 MG tablet, Take 2 tablets (40 mg total) by mouth daily., Disp: 60 tablet, Rfl: 3 Allergies  Allergen Reactions  . Lisinopril Swelling    Facial swelling  . Xarelto [Rivaroxaban] Other (See Comments)    "Blood came out of my penis"      Social History   Socioeconomic History  . Marital status: Married    Spouse name: Not on file  . Number of children: Not on file  . Years of education: Not on file  . Highest education level: Not on file  Occupational History  . Occupation: Retired    Comment: Former Patent examinerLPN  Social Needs  . Financial resource strain: Not on file  . Food insecurity:    Worry: Not on file    Inability:  Not on file  . Transportation needs:    Medical: Not on file    Non-medical: Not on file  Tobacco Use  . Smoking status: Former Smoker    Packs/day: 1.00    Years: 15.00    Pack years: 15.00    Types: Cigarettes, Cigars  . Smokeless tobacco: Former NeurosurgeonUser  . Tobacco comment: quit smoking ~ 50 yr ago  Substance and Sexual Activity  . Alcohol use: No    Alcohol/week: 2.0 standard drinks    Types: 2 Cans of beer per week    Comment: rare beer.  . Drug use: No  . Sexual activity: Yes  Lifestyle  . Physical activity:    Days per week: Not on file    Minutes per session: Not on file  . Stress: Not on file  Relationships  . Social connections:  Talks on phone: Not on file    Gets together: Not on file    Attends religious service: Not on file    Active member of club or organization: Not on file    Attends meetings of clubs or organizations: Not on file    Relationship status: Not on file  . Intimate partner violence:    Fear of current or ex partner: No    Emotionally abused: No    Physically abused: No    Forced sexual activity: No  Other Topics Concern  . Not on file  Social History Narrative   Lives in [place] with wife. Moved from [place] (2013). Does not routinely exercise.    Physical Exam      Future Appointments  Date Time Provider Department Center  04/25/2019  3:45 PM Felecia Shelling, DPM [facility]    BP 118/62   Pulse 60   Temp 97.7 F (36.5 C)   Resp 15   Wt 198 lb (89.8 kg)   SpO2 98%   BMI 31.01 kg/m   Weight yesterday-198 Last visit weight-195 CBG PTA-133  Pt recently home from hosp, he was having flank pain and his kidney function was very elevated-his torsemide was decreased to 40mg  daily. He got home Sunday. He has pill boxes but did not change his torsemide dose so that was done.  He has not taken his meds yet for this morning. No fevers, no coughing. No edema noted.  His hydralazine is being increased to 25mg  TID.  That  change was made in his pill box.   [name], EMT-Paramedic [phone] [facility] Paramedic  03/13/19

## 2019-03-13 NOTE — Patient Instructions (Addendum)
1. Increase hydralazine to 25 mg three times a day, this has been sent to your pharmacy.  2. Followup 1 month telehealth 29 June @ 2:20pm  3. Referred to EP for device check, this office will call you to schedule an appointment.  4. Lab work scheduled for 21 April @ 12:00 PM

## 2019-03-13 NOTE — Telephone Encounter (Signed)
Reviewed AVS/also mailed:  1. Increase hydralazine to 25 mg three times a day, this has been sent to your pharmacy.  2. Followup 1 month telehealth 29 June @ 2:20pm  3. Referred to EP for device check, this office will call you to schedule an appointment.  4. Lab work scheduled for 21 April @ 12:00 PM

## 2019-03-14 ENCOUNTER — Emergency Department (HOSPITAL_COMMUNITY): Payer: Self-pay

## 2019-03-14 ENCOUNTER — Observation Stay (HOSPITAL_COMMUNITY)
Admission: EM | Admit: 2019-03-14 | Discharge: 2019-03-15 | Disposition: A | Discharge status: Home / Self Care | Payer: Self-pay | Attending: Internal Medicine | Admitting: Internal Medicine

## 2019-03-14 ENCOUNTER — Encounter (HOSPITAL_COMMUNITY): Payer: Self-pay | Admitting: General Practice

## 2019-03-14 ENCOUNTER — Other Ambulatory Visit: Payer: Self-pay

## 2019-03-14 DIAGNOSIS — M62830 Muscle spasm of back: Principal | ICD-10-CM | POA: Insufficient documentation

## 2019-03-14 DIAGNOSIS — E1169 Type 2 diabetes mellitus with other specified complication: Secondary | ICD-10-CM | POA: Diagnosis present

## 2019-03-14 DIAGNOSIS — Z955 Presence of coronary angioplasty implant and graft: Secondary | ICD-10-CM | POA: Insufficient documentation

## 2019-03-14 DIAGNOSIS — E785 Hyperlipidemia, unspecified: Secondary | ICD-10-CM | POA: Insufficient documentation

## 2019-03-14 DIAGNOSIS — E1122 Type 2 diabetes mellitus with diabetic chronic kidney disease: Secondary | ICD-10-CM | POA: Insufficient documentation

## 2019-03-14 DIAGNOSIS — Z791 Long term (current) use of non-steroidal anti-inflammatories (NSAID): Secondary | ICD-10-CM | POA: Insufficient documentation

## 2019-03-14 DIAGNOSIS — Z7901 Long term (current) use of anticoagulants: Secondary | ICD-10-CM | POA: Insufficient documentation

## 2019-03-14 DIAGNOSIS — N183 Chronic kidney disease, stage 3 (moderate): Secondary | ICD-10-CM | POA: Insufficient documentation

## 2019-03-14 DIAGNOSIS — N179 Acute kidney failure, unspecified: Secondary | ICD-10-CM | POA: Insufficient documentation

## 2019-03-14 DIAGNOSIS — I130 Hypertensive heart and chronic kidney disease with heart failure and stage 1 through stage 4 chronic kidney disease, or unspecified chronic kidney disease: Secondary | ICD-10-CM | POA: Insufficient documentation

## 2019-03-14 DIAGNOSIS — Z888 Allergy status to other drugs, medicaments and biological substances status: Secondary | ICD-10-CM | POA: Insufficient documentation

## 2019-03-14 DIAGNOSIS — J9611 Chronic respiratory failure with hypoxia: Secondary | ICD-10-CM

## 2019-03-14 DIAGNOSIS — N400 Enlarged prostate without lower urinary tract symptoms: Secondary | ICD-10-CM | POA: Insufficient documentation

## 2019-03-14 DIAGNOSIS — E875 Hyperkalemia: Secondary | ICD-10-CM | POA: Insufficient documentation

## 2019-03-14 DIAGNOSIS — Z95810 Presence of automatic (implantable) cardiac defibrillator: Secondary | ICD-10-CM | POA: Insufficient documentation

## 2019-03-14 DIAGNOSIS — I4891 Unspecified atrial fibrillation: Secondary | ICD-10-CM | POA: Diagnosis present

## 2019-03-14 DIAGNOSIS — M7918 Myalgia, other site: Secondary | ICD-10-CM

## 2019-03-14 DIAGNOSIS — Z8249 Family history of ischemic heart disease and other diseases of the circulatory system: Secondary | ICD-10-CM | POA: Insufficient documentation

## 2019-03-14 DIAGNOSIS — R109 Unspecified abdominal pain: Secondary | ICD-10-CM | POA: Insufficient documentation

## 2019-03-14 DIAGNOSIS — M25552 Pain in left hip: Secondary | ICD-10-CM | POA: Insufficient documentation

## 2019-03-14 DIAGNOSIS — E669 Obesity, unspecified: Secondary | ICD-10-CM

## 2019-03-14 DIAGNOSIS — R2689 Other abnormalities of gait and mobility: Secondary | ICD-10-CM | POA: Insufficient documentation

## 2019-03-14 DIAGNOSIS — I2510 Atherosclerotic heart disease of native coronary artery without angina pectoris: Secondary | ICD-10-CM | POA: Insufficient documentation

## 2019-03-14 DIAGNOSIS — Z7984 Long term (current) use of oral hypoglycemic drugs: Secondary | ICD-10-CM | POA: Insufficient documentation

## 2019-03-14 DIAGNOSIS — F419 Anxiety disorder, unspecified: Secondary | ICD-10-CM | POA: Insufficient documentation

## 2019-03-14 DIAGNOSIS — N200 Calculus of kidney: Secondary | ICD-10-CM | POA: Insufficient documentation

## 2019-03-14 DIAGNOSIS — Z6832 Body mass index (BMI) 32.0-32.9, adult: Secondary | ICD-10-CM | POA: Insufficient documentation

## 2019-03-14 DIAGNOSIS — I480 Paroxysmal atrial fibrillation: Secondary | ICD-10-CM | POA: Insufficient documentation

## 2019-03-14 DIAGNOSIS — Z96642 Presence of left artificial hip joint: Secondary | ICD-10-CM | POA: Insufficient documentation

## 2019-03-14 DIAGNOSIS — M109 Gout, unspecified: Secondary | ICD-10-CM | POA: Insufficient documentation

## 2019-03-14 DIAGNOSIS — I5022 Chronic systolic (congestive) heart failure: Secondary | ICD-10-CM | POA: Insufficient documentation

## 2019-03-14 HISTORY — DX: Acute kidney failure, unspecified: N17.9

## 2019-03-14 LAB — BASIC METABOLIC PANEL
Anion gap: 15 (ref 5–15)
Anion gap: 12 (ref 5–15)
Anion gap: 11 (ref 5–15)
BUN: 41 mg/dL — ABNORMAL HIGH (ref 8–23)
BUN: 38 mg/dL — ABNORMAL HIGH (ref 8–23)
BUN: 37 mg/dL — ABNORMAL HIGH (ref 8–23)
CO2: 22 mmol/L (ref 22–32)
CO2: 25 mmol/L (ref 22–32)
CO2: 23 mmol/L (ref 22–32)
Calcium: 9.1 mg/dL (ref 8.9–10.3)
Calcium: 8.9 mg/dL (ref 8.9–10.3)
Calcium: 8.6 mg/dL — ABNORMAL LOW (ref 8.9–10.3)
Chloride: 102 mmol/L (ref 98–111)
Chloride: 103 mmol/L (ref 98–111)
Chloride: 105 mmol/L (ref 98–111)
Creatinine, Ser: 3.47 mg/dL — ABNORMAL HIGH (ref 0.61–1.24)
Creatinine, Ser: 3.24 mg/dL — ABNORMAL HIGH (ref 0.61–1.24)
Creatinine, Ser: 2.8 mg/dL — ABNORMAL HIGH (ref 0.61–1.24)
GFR calc Af Amer: 18 mL/min — ABNORMAL LOW (ref >60)
GFR calc Af Amer: 20 mL/min — ABNORMAL LOW (ref >60)
GFR calc Af Amer: 23 mL/min — ABNORMAL LOW (ref >60)
GFR calc non Af Amer: 16 mL/min — ABNORMAL LOW (ref >60)
GFR calc non Af Amer: 17 mL/min — ABNORMAL LOW (ref >60)
GFR calc non Af Amer: 20 mL/min — ABNORMAL LOW (ref >60)
Glucose, Bld: 134 mg/dL — ABNORMAL HIGH (ref 70–99)
Glucose, Bld: 94 mg/dL (ref 70–99)
Glucose, Bld: 101 mg/dL — ABNORMAL HIGH (ref 70–99)
Potassium: 5.2 mmol/L — ABNORMAL HIGH (ref 3.5–5.1)
Potassium: 5.4 mmol/L — ABNORMAL HIGH (ref 3.5–5.1)
Potassium: 5.1 mmol/L (ref 3.5–5.1)
Sodium: 139 mmol/L (ref 135–145)
Sodium: 140 mmol/L (ref 135–145)
Sodium: 139 mmol/L (ref 135–145)

## 2019-03-14 LAB — CBC WITH DIFFERENTIAL/PLATELET
Abs Immature Granulocytes: 0.02 10*3/uL (ref 0.00–0.07)
Basophils Absolute: 0 10*3/uL (ref 0.0–0.1)
Basophils Relative: 0 %
Eosinophils Absolute: 0.1 10*3/uL (ref 0.0–0.5)
Eosinophils Relative: 2 %
HCT: 33.1 % — ABNORMAL LOW (ref 39.0–52.0)
Hemoglobin: 10.5 g/dL — ABNORMAL LOW (ref 13.0–17.0)
Immature Granulocytes: 0 %
Lymphocytes Relative: 12 %
Lymphs Abs: 0.8 10*3/uL (ref 0.7–4.0)
MCH: 30.8 pg (ref 26.0–34.0)
MCHC: 31.7 g/dL (ref 30.0–36.0)
MCV: 97.1 fL (ref 80.0–100.0)
Monocytes Absolute: 0.9 10*3/uL (ref 0.1–1.0)
Monocytes Relative: 13 %
Neutro Abs: 5 10*3/uL (ref 1.7–7.7)
Neutrophils Relative %: 73 %
Platelets: 238 10*3/uL (ref 150–400)
RBC: 3.41 MIL/uL — ABNORMAL LOW (ref 4.22–5.81)
RDW: 18.9 % — ABNORMAL HIGH (ref 11.5–15.5)
WBC: 6.8 10*3/uL (ref 4.0–10.5)
nRBC: 0 % (ref 0.0–0.2)

## 2019-03-14 LAB — SEDIMENTATION RATE: Sed Rate: 73 mm/hr — ABNORMAL HIGH (ref 0–16)

## 2019-03-14 LAB — URINALYSIS, ROUTINE W REFLEX MICROSCOPIC
Bilirubin Urine: NEGATIVE
Glucose, UA: NEGATIVE mg/dL
Ketones, ur: NEGATIVE mg/dL
Nitrite: NEGATIVE
Protein, ur: NEGATIVE mg/dL
Specific Gravity, Urine: 1.012 (ref 1.005–1.030)
pH: 5 (ref 5.0–8.0)

## 2019-03-14 LAB — GLUCOSE, CAPILLARY
Glucose-Capillary: 113 mg/dL — ABNORMAL HIGH (ref 70–99)
Glucose-Capillary: 97 mg/dL (ref 70–99)

## 2019-03-14 LAB — CK: Total CK: 48 U/L — ABNORMAL LOW (ref 49–397)

## 2019-03-14 LAB — C-REACTIVE PROTEIN: CRP: 3.7 mg/dL — ABNORMAL HIGH (ref <1.0)

## 2019-03-14 MED ORDER — HYDRALAZINE HCL 25 MG PO TABS
25.0000 mg | ORAL_TABLET | Freq: Three times a day (TID) | ORAL | Status: DC
  Administered 2019-03-14 – 2019-03-15 (×4): 25 mg via ORAL
  Filled 2019-03-14 (×5): qty 1

## 2019-03-14 MED ORDER — FENTANYL CITRATE (PF) 100 MCG/2ML IJ SOLN
50.0000 ug | Freq: Once | INTRAMUSCULAR | Status: AC
  Administered 2019-03-14: 03:00:00 50 ug via INTRAVENOUS
  Filled 2019-03-14: qty 2

## 2019-03-14 MED ORDER — CYCLOBENZAPRINE HCL 5 MG PO TABS: 5.0000 mg | ORAL_TABLET | Freq: Three times a day (TID) | ORAL | Status: DC

## 2019-03-14 MED ORDER — SODIUM CHLORIDE 0.9 % IV BOLUS (SEPSIS)
500.0000 mL | Freq: Once | INTRAVENOUS | Status: AC
  Administered 2019-03-14: 04:00:00 500 mL via INTRAVENOUS

## 2019-03-14 MED ORDER — METHOCARBAMOL 500 MG PO TABS
500.0000 mg | ORAL_TABLET | Freq: Four times a day (QID) | ORAL | Status: DC | PRN
  Administered 2019-03-14 – 2019-03-15 (×3): 500 mg via ORAL
  Filled 2019-03-14 (×3): qty 1

## 2019-03-14 MED ORDER — DOCUSATE SODIUM 100 MG PO CAPS
100.0000 mg | ORAL_CAPSULE | Freq: Two times a day (BID) | ORAL | Status: DC
  Administered 2019-03-14 – 2019-03-15 (×3): 100 mg via ORAL
  Filled 2019-03-14 (×3): qty 1

## 2019-03-14 MED ORDER — HYDROCODONE-ACETAMINOPHEN 5-325 MG PO TABS
1.0000 | ORAL_TABLET | Freq: Four times a day (QID) | ORAL | Status: DC | PRN
  Administered 2019-03-14 – 2019-03-15 (×4): 2 via ORAL
  Filled 2019-03-14 (×4): qty 2

## 2019-03-14 MED ORDER — LIDOCAINE 5 % EX PTCH
1.0000 | MEDICATED_PATCH | CUTANEOUS | Status: DC
  Administered 2019-03-14 – 2019-03-15 (×2): 1 via TRANSDERMAL
  Filled 2019-03-14 (×2): qty 1

## 2019-03-14 MED ORDER — SODIUM CHLORIDE 0.9 % IV SOLN
INTRAVENOUS | Status: AC
  Administered 2019-03-14: 06:00:00 via INTRAVENOUS

## 2019-03-14 MED ORDER — ACETAMINOPHEN 650 MG RE SUPP: 650.0000 mg | Freq: Four times a day (QID) | RECTAL | Status: DC | PRN

## 2019-03-14 MED ORDER — APIXABAN 2.5 MG PO TABS
2.5000 mg | ORAL_TABLET | Freq: Two times a day (BID) | ORAL | Status: DC
  Administered 2019-03-14 – 2019-03-15 (×3): 2.5 mg via ORAL
  Filled 2019-03-14 (×3): qty 1

## 2019-03-14 MED ORDER — ALLOPURINOL 100 MG PO TABS: 200.0000 mg | ORAL_TABLET | Freq: Every day | ORAL | Status: DC | PRN

## 2019-03-14 MED ORDER — AMIODARONE HCL 200 MG PO TABS
200.0000 mg | ORAL_TABLET | Freq: Every day | ORAL | Status: DC
  Administered 2019-03-14 – 2019-03-15 (×2): 200 mg via ORAL
  Filled 2019-03-14 (×2): qty 1

## 2019-03-14 MED ORDER — MORPHINE SULFATE (PF) 4 MG/ML IV SOLN
4.0000 mg | Freq: Once | INTRAVENOUS | Status: AC
  Administered 2019-03-14: 04:00:00 4 mg via INTRAVENOUS
  Filled 2019-03-14: qty 1

## 2019-03-14 MED ORDER — METHOCARBAMOL 1000 MG/10ML IJ SOLN: 500.0000 mg | Freq: Once | INTRAMUSCULAR | Status: DC

## 2019-03-14 MED ORDER — ROSUVASTATIN CALCIUM 5 MG PO TABS
10.0000 mg | ORAL_TABLET | Freq: Every day | ORAL | Status: DC
  Administered 2019-03-14: 15:00:00 10 mg via ORAL
  Filled 2019-03-14 (×2): qty 2

## 2019-03-14 MED ORDER — SERTRALINE HCL 25 MG PO TABS
25.0000 mg | ORAL_TABLET | Freq: Every day | ORAL | Status: DC
  Administered 2019-03-14 – 2019-03-15 (×2): 25 mg via ORAL
  Filled 2019-03-14 (×2): qty 1

## 2019-03-14 MED ORDER — METHOCARBAMOL 1000 MG/10ML IJ SOLN
500.0000 mg | Freq: Once | INTRAVENOUS | Status: AC
  Administered 2019-03-14: 03:00:00 500 mg via INTRAVENOUS
  Filled 2019-03-14: qty 5

## 2019-03-14 MED ORDER — SODIUM POLYSTYRENE SULFONATE 15 GM/60ML PO SUSP
30.0000 g | Freq: Once | ORAL | Status: AC
  Administered 2019-03-14: 30 g via ORAL
  Filled 2019-03-14: qty 120

## 2019-03-14 MED ORDER — ACETAMINOPHEN 325 MG PO TABS: 650.0000 mg | ORAL_TABLET | Freq: Four times a day (QID) | ORAL | Status: DC | PRN

## 2019-03-14 MED ORDER — ONDANSETRON HCL 4 MG/2ML IJ SOLN
4.0000 mg | Freq: Once | INTRAMUSCULAR | Status: AC
  Administered 2019-03-14: 03:00:00 4 mg via INTRAVENOUS
  Filled 2019-03-14: qty 2

## 2019-03-14 MED ORDER — ISOSORBIDE MONONITRATE ER 30 MG PO TB24
15.0000 mg | ORAL_TABLET | Freq: Every day | ORAL | Status: DC
  Administered 2019-03-14 – 2019-03-15 (×2): 15 mg via ORAL
  Filled 2019-03-14 (×2): qty 1

## 2019-03-14 MED ORDER — DICLOFENAC SODIUM 1 % TD GEL
2.0000 g | Freq: Four times a day (QID) | TRANSDERMAL | Status: DC
  Administered 2019-03-14 – 2019-03-15 (×5): 2 g via TOPICAL
  Filled 2019-03-14: qty 100

## 2019-03-14 MED ORDER — FINASTERIDE 5 MG PO TABS
5.0000 mg | ORAL_TABLET | Freq: Every day | ORAL | Status: DC
  Administered 2019-03-14 – 2019-03-15 (×2): 5 mg via ORAL
  Filled 2019-03-14 (×2): qty 1

## 2019-03-14 NOTE — ED Notes (Signed)
ED TO INPATIENT HANDOFF REPORT  ED Nurse Name and Phone #:  Florentina Addison [phone]  S Name/Age/Gender Ryan Macdonald 85 y.o. male Room/Bed: 028C/028C  Code Status   Code Status: Full Code  Home/SNF/Other Home Patient oriented to: self, place, time and situation Is this baseline? Yes   Triage Complete: Triage complete  Chief Complaint hip pain  Triage Note Per EMS, Pt experienced sudden onset left sided hip pain.  He was able to slowly make it to the bed w/ a the use of a walker.  Pt was discharged for kidney failure on Sunday.  Denies fever, SOB or contact w/ COVID pt.     Allergies Allergies  Allergen Reactions  . Lisinopril Swelling    Facial swelling  . Xarelto [Rivaroxaban] Other (See Comments)    "Blood came out of my penis"    Level of Care/Admitting Diagnosis ED Disposition    ED Disposition Condition Comment   Admit  Hospital Area: [name] [[id number]]  Level of Care: Med-Surg [16]  Diagnosis: AKI (acute kidney injury) [facility]) [[id number]]  Admitting Physician: Anne Shutter [[id number]]  Attending Physician: Anne Shutter [[id number]]  Possible Covid Disease Patient Isolation: N/A  PT Class (Do Not Modify): Observation [104]  PT Acc Code (Do Not Modify): Observation [10022]       B Medical/Surgery History Past Medical History:  Diagnosis Date  . Atrial fibrillation (HCC)   . Cardiomyopathy (HCC)   . Cat scratch fever    removed mass on right side neck  . Cataracts, bilateral   . CHF (congestive heart failure) (HCC)   . Chronic kidney disease   . Corns and callosities   . Diabetes mellitus without complication (HCC)    TYPE 2  . Dyspnea   . Erythema intertrigo   . Flat foot   . Gout   . High cholesterol   . Hx of urethral stricture   . Hypertension   . Kidney stones   . Morbid obesity (HCC)   . Nonischemic cardiomyopathy (HCC)    a. ? 2009 Cath in [place] - nl cors per pt;  b. 09/2013 Echo: EF 25-30%, sev diff HK.  . Obesity   .  Osteoarthritis   . PAF (paroxysmal atrial fibrillation) (HCC)    a. post-op hip in 2014 - prev on xarelto.  . Renal insufficiency    Past Surgical History:  Procedure Laterality Date  . CARDIAC CATHETERIZATION  1980's  . CIRCUMCISION  1940's  . CORONARY STENT INTERVENTION N/A 08/18/2018   Procedure: CORONARY STENT INTERVENTION;  Surgeon: Corky Crafts, MD;  Location: [facility] INVASIVE CV LAB;  Service: Cardiovascular;  Laterality: N/A;  . CYSTOSCOPY WITH URETHRAL DILATATION  10/23/2013  . CYSTOSCOPY WITH URETHRAL DILATATION N/A 10/23/2013   Procedure: CYSTOSCOPY WITH URETHRAL DILATATION;  Surgeon: Kathi Ludwig, MD;  Location: [facility] OR;  Service: Urology;  Laterality: N/A;  . INCISION AND DRAINAGE OF WOUND Right 1980's   "cat scratch" (10/23/2013)  . INGUINAL HERNIA REPAIR Right 1950's  . INTRAVASCULAR ULTRASOUND/IVUS N/A 08/18/2018   Procedure: Intravascular Ultrasound/IVUS;  Surgeon: Corky Crafts, MD;  Location: [facility] INVASIVE CV LAB;  Service: Cardiovascular;  Laterality: N/A;  . KIDNEY STONE SURGERY Right 1970's; 1983   "twice"  . LEFT HEART CATH AND CORONARY ANGIOGRAPHY N/A 08/18/2018   Procedure: LEFT HEART CATH AND CORONARY ANGIOGRAPHY;  Surgeon: Corky Crafts, MD;  Location: [facility] INVASIVE CV LAB;  Service: Cardiovascular;  Laterality: N/A;  . TONSILLECTOMY AND ADENOIDECTOMY  1940's  .  TOTAL HIP ARTHROPLASTY Left 10/23/2013   Procedure: TOTAL HIP ARTHROPLASTY;  Surgeon: Valeria Batman, MD;  Location: [facility] OR;  Service: Orthopedics;  Laterality: Left;     A IV Location/Drains/Wounds Patient Lines/Drains/Airways Status   Active Line/Drains/Airways    Name:   Placement date:   Placement time:   Site:   Days:   Peripheral IV 03/14/19 Right Antecubital   03/14/19    0235    Antecubital   less than 1          Intake/Output Last 24 hours No intake or output data in the 24 hours ending 03/14/19 0645  Labs/Imaging Results for orders placed or performed during the  hospital encounter of 03/14/19 (from the past 48 hour(s))  CBC with Differential     Status: Abnormal   Collection Time: 03/14/19  2:03 AM  Result Value Ref Range   WBC 6.8 4.0 - 10.5 K/uL   RBC 3.41 (L) 4.22 - 5.81 MIL/uL   Hemoglobin 10.5 (L) 13.0 - 17.0 g/dL   HCT 66.0 (L) 63.0 - 16.0 %   MCV 97.1 80.0 - 100.0 fL   MCH 30.8 26.0 - 34.0 pg   MCHC 31.7 30.0 - 36.0 g/dL   RDW 10.9 (H) 32.3 - 55.7 %   Platelets 238 150 - 400 K/uL   nRBC 0.0 0.0 - 0.2 %   Neutrophils Relative % 73 %   Neutro Abs 5.0 1.7 - 7.7 K/uL   Lymphocytes Relative 12 %   Lymphs Abs 0.8 0.7 - 4.0 K/uL   Monocytes Relative 13 %   Monocytes Absolute 0.9 0.1 - 1.0 K/uL   Eosinophils Relative 2 %   Eosinophils Absolute 0.1 0.0 - 0.5 K/uL   Basophils Relative 0 %   Basophils Absolute 0.0 0.0 - 0.1 K/uL   Immature Granulocytes 0 %   Abs Immature Granulocytes 0.02 0.00 - 0.07 K/uL    Comment: Performed at [facility], [address], [place], [place] [address]  Basic metabolic panel     Status: Abnormal   Collection Time: 03/14/19  2:03 AM  Result Value Ref Range   Sodium 139 135 - 145 mmol/L   Potassium 5.2 (H) 3.5 - 5.1 mmol/L   Chloride 102 98 - 111 mmol/L   CO2 22 22 - 32 mmol/L   Glucose, Bld 134 (H) 70 - 99 mg/dL   BUN 41 (H) 8 - 23 mg/dL   Creatinine, Ser 5.42 (H) 0.61 - 1.24 mg/dL   Calcium 9.1 8.9 - 70.6 mg/dL   GFR calc non Af Amer 16 (L) >60 mL/min   GFR calc Af Amer 18 (L) >60 mL/min   Anion gap 15 5 - 15    Comment: Performed at [facility], [address], [place], [place] [address]  Sedimentation rate     Status: Abnormal   Collection Time: 03/14/19  2:03 AM  Result Value Ref Range   Sed Rate 73 (H) 0 - 16 mm/hr    Comment: Performed at [facility], [address], [place], [place] [address]  C-reactive protein     Status: Abnormal   Collection Time: 03/14/19  2:03 AM  Result Value Ref Range   CRP 3.7 (H) <1.0 mg/dL    Comment: Performed at [facility],  [address] [place], [place] [address]  Basic metabolic panel     Status: Abnormal   Collection Time: 03/14/19  4:50 AM  Result Value Ref  Range   Sodium 140 135 - 145 mmol/L   Potassium 5.4 (H) 3.5 - 5.1 mmol/L   Chloride 103 98 - 111 mmol/L   CO2 25 22 - 32 mmol/L   Glucose, Bld 94 70 - 99 mg/dL   BUN 38 (H) 8 - 23 mg/dL   Creatinine, Ser 1.613.24 (H) 0.61 - 1.24 mg/dL   Calcium 8.9 8.9 - 09.610.3 mg/dL   GFR calc non Af Amer 17 (L) >60 mL/min   GFR calc Af Amer 20 (L) >60 mL/min   Anion gap 12 5 - 15    Comment: Performed at [facility], [address] [place], [place] [address]   Dg Hip Lucienne CapersUnilat W Or Wo Pelvis 2-3 Views Left  Result Date: 03/14/2019 CLINICAL DATA:  Left-sided hip pain, no known injury, initial EN EXAM: DG HIP (WITH OR WITHOUT PELVIS) 2-3V LEFT COMPARISON:  None. FINDINGS: Changes of prior left hip replacement are noted. No acute fracture or dislocation is seen. No soft tissue abnormality is noted. Pelvic ring is intact. Degenerative changes of lumbar spine are noted. IMPRESSION: Left hip replacement. No acute abnormality noted. Electronically Signed   By: Alcide CleverMark  Lukens M.D.   On: 03/14/2019 01:58    Pending Labs Unresulted Labs (From admission, onward)   None      Vitals/Pain Today's Vitals   03/14/19 0430 03/14/19 0500 03/14/19 0530 03/14/19 0543  BP: 122/74 137/70 129/69   Pulse: 77 74 70   Resp:      Temp:      TempSrc:      SpO2: 98% 97% 95%   Weight:      Height:      PainSc:    10-Worst pain ever    Isolation Precautions No active isolations  Medications Medications  allopurinol (ZYLOPRIM) tablet 200 mg (has no administration in time range)  HYDROcodone-acetaminophen (NORCO/VICODIN) 5-325 MG per tablet 1-2 tablet (2 tablets Oral Given 03/14/19 [id number])  amiodarone (PACERONE) tablet 200 mg (has no administration in time range)  isosorbide mononitrate (IMDUR) 24 hr tablet 15 mg (has no administration in time range)  hydrALAZINE (APRESOLINE) tablet 25  mg (has no administration in time range)  rosuvastatin (CRESTOR) tablet 10 mg (has no administration in time range)  sertraline (ZOLOFT) tablet 25 mg (has no administration in time range)  docusate sodium (COLACE) capsule 100 mg (has no administration in time range)  finasteride (PROSCAR) tablet 5 mg (has no administration in time range)  apixaban (ELIQUIS) tablet 2.5 mg (has no administration in time range)  acetaminophen (TYLENOL) tablet 650 mg (has no administration in time range)    Or  acetaminophen (TYLENOL) suppository 650 mg (has no administration in time range)  diclofenac sodium (VOLTAREN) 1 % transdermal gel 2 g (has no administration in time range)  0.9 %  sodium chloride infusion ( Intravenous New Bag/Given 03/14/19 0544)  fentaNYL (SUBLIMAZE) injection 50 mcg (50 mcg Intravenous Given 03/14/19 0236)  ondansetron (ZOFRAN) injection 4 mg (4 mg Intravenous Given 03/14/19 0236)  methocarbamol (ROBAXIN) 500 mg in dextrose 5 % 50 mL IVPB (0 mg Intravenous Stopped 03/14/19 0310)  sodium chloride 0.9 % bolus 500 mL (0 mLs Intravenous Stopped 03/14/19 0538)  morphine 4 MG/ML injection 4 mg (4 mg Intravenous Given 03/14/19 0405)    Mobility walks Low fall risk   Focused Assessments Cardiac Assessment Handoff:    Lab Results  Component Value Date   TROPONINI 0.03 (HH) 11/24/2018   Lab Results  Component Value Date  DDIMER 0.98 (H) 02/13/2018   Does the Patient currently have chest pain? No     R Recommendations: See Admitting Provider Note  Report given to:   Additional Notes:

## 2019-03-14 NOTE — Discharge Instructions (Addendum)
Please hold Torsemide, Propiolactone until you see Dr. Violeta Gelinas next Thursday. As we discussed, if you developed shortness of breath meanwhile, it is ok to take Torsemide (and potassium pill with it) and contact your doctor. Make sure to follow your appointment. Take Robaxin (Methocarbamol) as instructed for you for back/flank pain. Continue rest of your medications as before. We contacted your PCP, Dr. Tereasa Coop about your hospitalization and she is update about discharge plan.  You can call her if you needed more medication for your muscle pain.  We recommend to see a nephrologist. Dr. Tereasa Coop will reffer you to one.  Please call us at [phone] if having question or concern. Thank you    Information on my medicine - ELIQUIS (apixaban)  Why was Eliquis prescribed for you? Eliquis was prescribed for you to reduce the risk of a blood clot forming that can cause a stroke if you have a medical condition called atrial fibrillation (a type of irregular heartbeat).  What do You need to know about Eliquis ? Take your Eliquis TWICE DAILY - one tablet in the morning and one tablet in the evening with or without food. If you have difficulty swallowing the tablet whole please discuss with your pharmacist how to take the medication safely.  Take Eliquis exactly as prescribed by your doctor and DO NOT stop taking Eliquis without talking to the doctor who prescribed the medication.  Stopping may increase your risk of developing a stroke.  Refill your prescription before you run out.  After discharge, you should have regular check-up appointments with your healthcare provider that is prescribing your Eliquis.  In the future your dose may need to be changed if your kidney function or weight changes by a significant amount or as you get older.  What do you do if you miss a dose? If you miss a dose, take it as soon as you remember on the same day and resume taking twice daily.  Do not take more  than one dose of ELIQUIS at the same time to make up a missed dose.  Important Safety Information A possible side effect of Eliquis is bleeding. You should call your healthcare provider right away if you experience any of the following: ? Bleeding from an injury or your nose that does not stop. ? Unusual colored urine (red or dark brown) or unusual colored stools (red or black). ? Unusual bruising for unknown reasons. ? A serious fall or if you hit your head (even if there is no bleeding).  Some medicines may interact with Eliquis and might increase your risk of bleeding or clotting while on Eliquis. To help avoid this, consult your healthcare provider or pharmacist prior to using any new prescription or non-prescription medications, including herbals, vitamins, non-steroidal anti-inflammatory drugs (NSAIDs) and supplements.  This website has more information on Eliquis (apixaban): http://www.eliquis.com/eliquis/home

## 2019-03-14 NOTE — H&P (Addendum)
Date: 03/14/2019               Patient Name:  Ryan Macdonald MRN: [id number]  DOB: 10-21-1934 Age / Sex: 85 y.o., male   PCP: [facility] [place] Service: Internal Medicine Teaching Service         Attending Physician: Dr. Rebeca Alert, Raynaldo Opitz, MD    First Contact: Dr. Linna Hoff Pager: [phone]  Second Contact: Dr. Lorella Nimrod Pager: [phone]       After Hours (After 5p/  First Contact Pager: [phone]  weekends / holidays): Second Contact Pager: [phone]   Chief Complaint: left flank pain   History of Present Illness:  Ryan Macdonald is a 85 yo M w/ PMH of HFrEF (EF 20-25%), CAD s/p ICD, A.fib on Eliquis, T2DM, HTN, CAD s/p LAD stent and chronic hypoxic respiratory failure presenting with left flank pain. He was in his usual state of health until midnight when he began to experience acute onset left sided flank pain without obvious inciting event. He states he had similar symptoms prior but not with this severity or quality. Described 12/10 cramping intermittent focal pain over left lower back. He states in prior episode the pain resolved without intervention by the morning after admission. He denies any dysuria, frequency, urgency. Denies any fevers, chills, nausea, vomiting, diarrhea. He mentions he has had prior hip replacement on left hip. Denies any edema shortness of breath, chest pain, palpitations, sick contact.  On chart review, he had recent admission for acute on chronic renal failure from 03/08/19-03/11/19 thought to be due to pre-renal azotemia with fluid resuscitation causing resolution of his AKI. He was discharged w/ recommendation to change torsemide from 58m bid to 463mdaily which he states that he has been following.  In the ED, X-ray of the hips was obtained showing no acute abnormality. He was incidentally found to have AKI w/ creatinine 3.47 from baseline of 2 and was given 500cc NS bolus w/ potassium of 5.2. IM was consulted for admission  for acute on chronic renal failure.  Meds:  Current Meds  Medication Sig  . allopurinol (ZYLOPRIM) 100 MG tablet Take 200 mg by mouth daily as needed (for gout symptoms).   . Marland Kitchenmiodarone (PACERONE) 200 MG tablet Take 1 tablet (200 mg total) by mouth daily.  . Marland Kitchenpixaban (ELIQUIS) 2.5 MG TABS tablet Take 1 tablet (2.5 mg total) by mouth 2 (two) times daily. (Patient taking differently: Take 2.5 mg by mouth 2 (two) times daily. Caution - blood thinner)  . cyanocobalamin 1000 MCG tablet Take 1 tablet (1,000 mcg total) by mouth daily.  . diclofenac sodium (VOLTAREN) 1 % GEL Apply 4 g topically 4 (four) times daily.  . Marland Kitchenocusate sodium (COLACE) 100 MG capsule Take 100 mg by mouth 2 (two) times daily.  . finasteride (PROSCAR) 5 MG tablet Take 1 tablet (5 mg total) by mouth daily.  . Marland KitchenlipiZIDE (GLUCOTROL) 5 MG tablet Take 5 mg by mouth daily before breakfast.  . hydrALAZINE (APRESOLINE) 25 MG tablet Take 1 tablet (25 mg total) by mouth 3 (three) times daily.  . Marland KitchenYDROcodone-acetaminophen (NORCO/VICODIN) 5-325 MG tablet Take 2 tablets by mouth every 4 (four) hours as needed. (Patient taking differently: Take 2 tablets by mouth every 4 (four) hours as needed for moderate pain. )  . isosorbide mononitrate (IMDUR) 30 MG 24 hr tablet Take 0.5 tablets (15 mg total) by mouth daily.  . Marland Kitchenetoconazole (NIZORAL) 2 %  shampoo Apply 1 application topically 2 (two) times a week.  . magnesium oxide (MAG-OX) 400 MG tablet Take 400 mg by mouth daily.  . nitroGLYCERIN (NITROSTAT) 0.4 MG SL tablet Place 1 tablet (0.4 mg total) under the tongue every 5 (five) minutes x 3 doses as needed for chest pain.  . potassium chloride SA (K-DUR,KLOR-CON) 20 MEQ tablet Take 20 mEq by mouth daily.  . rosuvastatin (CRESTOR) 10 MG tablet Take 1 tablet (10 mg total) by mouth daily at 6 PM.  . sertraline (ZOLOFT) 25 MG tablet Take 25 mg by mouth daily.   Marland Kitchen spironolactone (ALDACTONE) 25 MG tablet Take 0.5 tablets (12.5 mg total) by mouth daily.   Marland Kitchen torsemide (DEMADEX) 20 MG tablet Take 2 tablets (40 mg total) by mouth daily.   Allergies: Allergies as of 03/14/2019 - Review Complete 03/14/2019  Allergen Reaction Noted  . Lisinopril Swelling 10/11/2013  . Xarelto [rivaroxaban] Other (See Comments) 02/13/2018   Past Medical History:  Diagnosis Date  . Atrial fibrillation ([place])   . Cardiomyopathy ([place])   . Cat scratch fever    removed mass on right side neck  . Cataracts, bilateral   . CHF (congestive heart failure) ([place])   . Chronic kidney disease   . Corns and callosities   . Diabetes mellitus without complication ([place])    TYPE 2  . Dyspnea   . Erythema intertrigo   . Flat foot   . Gout   . High cholesterol   . Hx of urethral stricture   . Hypertension   . Kidney stones   . Morbid obesity ([place])   . Nonischemic cardiomyopathy ([place])    a. ? 2009 Cath in [place] - nl cors per pt;  b. 09/2013 Echo: EF 25-30%, sev diff HK.  . Obesity   . Osteoarthritis   . PAF (paroxysmal atrial fibrillation) ([place])    a. post-op hip in 2014 - prev on xarelto.  . Renal insufficiency    Family History:  Denies any relevant family history Family History  Problem Relation Age of Onset  . Heart disease Mother   . Heart Problems Maternal Grandmother   . Other Other        negative for premature CAD   Social History: Lives with wife. 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 Denies any tobacco, illicit substance use. Mentions very occasional beer consumption.  Review of Systems: A complete ROS was negative except as per HPI.  Physical Exam: Blood pressure (!) 144/75, pulse 74, temperature 98.7 F (37.1 C), temperature source Oral, resp. rate 18, height 5' 7"  (1.702 m), weight 90.3 kg, SpO2 99 %. Gen: Well-developed, obese, wincing intermittently due to pain HEENT: hearing intact, EOMI, MMM Neck: supple, ROM intact CV: RRR, S1, S2 normal, No rubs, no murmurs, no gallops Pulm: CTAB, No rales, no wheezes Abd: Soft, BS+, NTND, No rebound, no  guarding, negative CVA tenderness Extm: Unable to tolerate L leg lift due to pain. Hip abduction and flexion limited. Otherwise ROM intact. Negative paraspinal tenderness. Peripheral pulses intact, No peripheral edema Skin: Dry, Warm, poor skin turgor, no rashes, lesions, wounds.  Neuro: AAOx3, No focal deficits, lower extremity strength intact, sensation to light touch intact. Psych: Normal mood and affect  EKG: N/A  CXR: N/A  Assessment & Plan by Problem: Active Problems:   AKI (acute kidney injury) [facility])  Ryan Macdonald is a 85 yo M w/ PMH of HFrEF (EF 20-25%), CAD s/p ICD, A.fib on Eliquis, T2DM, HTN, CAD s/p  LAD stent and chronic hypoxic respiratory failure. His presenting symptoms appear similar to his prior admission but with increased severity although at time of discharge these symptoms had resolved. Thought to be due to overdiuresis, he mentions he followed discharge instructions at home but unclear etiology for his AKI. Most likely due to dehydration, will attempt gentle rehydration as prior. His flank pain appear to be musculoskeletal in origin, possibly driven by electrolyte abnormality due to his AKI. Prior work-up showed right-sided nephrolithiasis and lumbar DJD. Other differential includes herniated disc or sciatica.  AKI on CKD 2/2 dehydration vs diuretic use Baseline creatinine 1.8-2.0. Admit creatinine 3.47. Bun/Cr ratio 12. K 5.2. Appear dry on exam. Received 500 cc bolus in ED. - IV NS 100cc/hr for 10 hrs - Trend Bmp - Avoid nephrotoxic meds - Hold home diuretics - torsemide 2m daily  L Flank Pain 2/2 muscle spasms vs tendonitis Hip X-ray showing no acute abnormality. Renal UKoreaon prior admission showing simple cysts in L kidney, Non-obstructing stones on R kidney. Positional, focal tenderness on exam above left gluteus without obvious joint involvement. CRP 3.7 ESR 73. Received robaxin and morphine in ED. - PT eval and treat - Norco [id number] PRN - K-pads   Chronic systolic heart failure EF 20-25%. Currently no respiratory distress. No orthopnea. No chest pain, palpitations. - Holding diuretic in setting of AKI - C/w home meds: Imdur 1534maily  HTN - c/w home meds: hydralazine 48m108mD  HLD - c/w home meds: rosuvastatin 10mg37mly  BPH - c/w home meds: finasteride 5mg d77my  Anxiety - c/w home meds: sertraline 48mg d76m  PAF - c/w home meds:  amiodarone 200mg da52m Eliquis 2.5mg BID 67mT prophx: Eliquis Diet: Cardiac/Renal Bowel: Senokot Code: Full  Dispo: Admit patient to Observation with expected length of stay less than 2 midnights.  Signed: Mishon Blubaugh, JoshMosetta Anis/2020, 4:17 AM  Pager: [phone]

## 2019-03-14 NOTE — Progress Notes (Signed)
Patient has voided approx 200 mls since admit to unit today.  Bladder scanned for residual and no measurable amount was identified in the bladder = 0.

## 2019-03-14 NOTE — Progress Notes (Signed)
PT Cancellation Note  Patient Details Name: Javed Juhasz MRN: [id number] DOB: 01/23/1934   Cancelled Treatment:    Reason Eval/Treat Not Completed: Other (comment). Patient with admissions team nurse. Will follow-up for PT evaluation as schedule permits.  Ina Homes, PT, DPT Acute Rehabilitation Services  Pager [phone] Office [phone]  Malachy Chamber 03/14/2019, 9:32 AM

## 2019-03-14 NOTE — Progress Notes (Signed)
Heart Failure TeleHealth Note  Due to national recommendations of social distancing due to COVID 19, Audio/video telehealth visit is felt to be most appropriate for this patient at this time.  See MyChart message from today for patient consent regarding telehealth for [facility].  Date:  03/14/2019   ID:  Ryan Macdonald, DOB [birth year], MRN [id number]  Location: Home  Provider location: [facility] Type of Visit: Established patient   PCP:  [facility]  Cardiologist:  [name], MD Primary HF: Dr. Shirlee Latch  Chief Complaint: Exertional shortness of breath   History of Present Illness: Ryan Macdonald is a 85 y.o. male who presents via audio/video conferencing for a telehealth visit today.   I was assisted by paramedicine on-site.   he denies symptoms worrisome for COVID 19.   Patient has a history ofsystolic HF due toNICM, Biotronic ICD, A Fibon Eliquis, DMII, HTN,CAD s/p pLAD stent in 07/2018 with residual non-obstructive disease,chronic hypoxic respiratory failure on home O2 PRN,and depression.  Admitted 12/26 - 11/28/18 with A/C systolic CHF. Diuresed 21 lbs and meds adjusted as tolerated. Echo showed EF 20-25%. EP was consulted for RV pacing and ICD settings were adjusted.  Patient has been stable recently.  He can walk in [place] and around his house without significant dyspnea.  No chest pain.  Main complaint is low backpain.  Weight is trending down.  He was admitted in 4/20 with dehydration in setting of significant weight loss, creatinine was up to 4.3.  Torsemide was decreased to 40 mg daily. No BRBPR/melena.  No edema.    Labs (4/20): K 4.1, creatinine 2.23  ECG (personally reviewed): NSR with long 1st degree AVB, incomplete LBBB (118 msec), nonspecific T wave flattening  PMH: 1. Atrial fibrillation: Paroxysmal. 2. CKD: Stage 3. 3. Type 2 diabetes 4. Hyperlipidemia 5. HTN 6. Nephrolithiasis 7. ?NAFLD 8. Gout   9. CAD: LHC (9/19) with 40% distal LM, 50% ostial LAD, 50% mid LAD, 75% proximal LAD treated with DES.  10. Chronic hypoxemic respiratory failure. Has home oxygen. ?COPD: Prior smoker.  11. CHF: EF low since 2014. Primarily nonischemic cardiomyopathy. Biotronik ICD. - Echo (12/19) with EF 20-25%, diffuse hypokinesis, moderate MR, mildly dilated RV with moderately decreased systolic function, PASP 39 mmHg.  - ACEI angioedema.    No current facility-administered medications for this encounter.    No current outpatient medications on file.   Facility-Administered Medications Ordered in Other Encounters  Medication Dose Route Frequency Provider Last Rate Last Dose  . 0.9 %  sodium chloride infusion   Intravenous Continuous Eulah Pont, MD 100 mL/hr at 03/14/19 0544    . acetaminophen (TYLENOL) tablet 650 mg  650 mg Oral Q6H PRN Eulah Pont, MD       Or  . acetaminophen (TYLENOL) suppository 650 mg  650 mg Rectal Q6H PRN Eulah Pont, MD      . allopurinol (ZYLOPRIM) tablet 200 mg  200 mg Oral Daily PRN Eulah Pont, MD      . amiodarone (PACERONE) tablet 200 mg  200 mg Oral Daily Eulah Pont, MD      . apixaban Everlene Balls) tablet 2.5 mg  2.5 mg Oral BID Eulah Pont, MD      . diclofenac sodium (VOLTAREN) 1 % transdermal gel 2 g  2 g Topical QID Eulah Pont, MD      . docusate sodium (COLACE) capsule 100 mg  100 mg Oral BID Eulah Pont, MD      .  finasteride (PROSCAR) tablet 5 mg  5 mg Oral Daily Eulah Pont, MD      . hydrALAZINE (APRESOLINE) tablet 25 mg  25 mg Oral TID Eulah Pont, MD      . HYDROcodone-acetaminophen (NORCO/VICODIN) 5-325 MG per tablet 1-2 tablet  1-2 tablet Oral Q6H PRN Eulah Pont, MD   2 tablet at 03/14/19 [phone]  . isosorbide mononitrate (IMDUR) 24 hr tablet 15 mg  15 mg Oral Daily Eulah Pont, MD      . rosuvastatin (CRESTOR) tablet 10 mg  10 mg Oral q1800 Eulah Pont, MD      . sertraline (ZOLOFT) tablet 25 mg  25 mg Oral Daily Eulah Pont, MD        Allergies:   Lisinopril and  Xarelto [rivaroxaban]   Social History:  The patient  reports that he has quit smoking. His smoking use included cigarettes and cigars. He has a 15.00 pack-year smoking history. He has quit using smokeless tobacco. He reports that he does not drink alcohol or use drugs.   Family History:  The patient's family history includes Heart Problems in his maternal grandmother; Heart disease in his mother; Other in an other family member.   ROS:  Please see the history of present illness.   All other systems are personally reviewed and negative.   Exam:  (Video/Tele Health Call; Exam is subjective and or/visual.) BP 118/62, HR 57 General:  Speaks in full sentences. No resp difficulty. Neck: No JVD Lungs: Normal respiratory effort with conversation.  Abdomen: Non-distended per patient report Extremities: Pt denies edema. Neuro: Alert & oriented x 3.   Recent Labs: 11/23/2018: B Natriuretic Peptide 1,382.9 11/24/2018: TSH 18.521 03/10/2019: ALT 57; Magnesium 1.6 03/14/2019: BUN 38; Creatinine, Ser 3.24; Hemoglobin 10.5; Platelets 238; Potassium 5.4; Sodium 140  Personally reviewed   Wt Readings from Last 3 Encounters:  03/14/19 90.3 kg (199 lb)  03/13/19 89.8 kg (198 lb)  03/10/19 90.1 kg (198 lb 10.2 oz)      ASSESSMENT AND PLAN:  1. Chronic systolic CHF: Primarily nonischemic cardiomyopathy. EF has been low since at least 2014.  He has single chamber Biotronik ICD. QRS not wide enough for CRT.Settings adjusted in the past to prevent RV pacing in the face of a long 1st degree AV block.  Most recent echo in 12/19 with EF 20-25%, diffuse hypokinesis, moderate RV systolic dysfunction. NYHA class II symptoms.  Weight trending down, does not appear volume overloaded.  Recent admission with dehydration and AKI.    - Continue torsemide 40 mg daily.   - Continue spironolactone 12.5 mg qhs.   - Increase hydralazine to 25 mg tid today and continue Imdur 15 mg daily. Bidil not [facility] by [facility]. -Hold BB  with long 1st degree AVB to limit RV pacing.  -No ACEI or ARNI with angioedema on lisinopril. - Consider Cardiomems in the future to monitor volume.  - He needs to get enrolled in our device clinic to follow his Biotronik device.  2. CAD: S/p PCI 07/2018 to pLAD but CAD does not explain his cardiomyopathy.  No chest pain.  - He is off Plavix now, on Eliquis for his atrial fibrillation.  - Continue Crestor, arrange to check lipids.  3.Atrial fibrillation: Paroxysmal.  Regular on exam per paramedicine.   - Continue Eliquis 2.5 mg bid (age, creatinine > 1.5).  - Continue amiodarone 200 daily to maintain NSR.  Need to check LFTs, TSH. He will need a regular eye exam.  4. CKD: Stage  3.  I will arrange for BMET.  5. Chronic hypoxemic respiratory failure: He is not currently using oxygen.  Prior smoker.  - Needs eventual PFTs but cannot get currently due to coronavirus restrictions.   COVID screen The patient does not have any symptoms that suggest any further testing/ screening at this time.  Social distancing reinforced today.  Relevant cardiac medications were reviewed at length with the patient today.   The patient does not have concerns regarding their medications at this time.   Recommended follow-up:  1 month via telehealth  Today, I have spent 25 minutes with the patient with telehealth technology discussing the above issues .    Signed, Marca Anconaalton Trayce Maino, MD  03/14/2019 8:19 AM  [facility] [address] [facility] [place] [address] [phone] (office) [phone] (fax)

## 2019-03-14 NOTE — ED Provider Notes (Signed)
TIME SEEN: 2:00 AM  CHIEF COMPLAINT: Left hip pain  HPI: Patient is an 85 year old male with history of atrial fibrillation on Eliquis, CHF, diabetes, hyperlipidemia, hypertension who presents to the emergency department with left hip pain.  States this occurred 2 days ago and is painful with any movement of the hip.  Has a hard time walking.  States normally walks without assistance but may use a walker to get up and down stairs.  Denies any known injury or falls.  No fevers, redness, warmth or swelling to the leg.  No numbness or weakness.  Denies back pain.  Has never had similar symptoms.  He denies bowel or bladder incontinence.   ROS: See HPI Constitutional: no fever  Eyes: no drainage  ENT: no runny nose   Cardiovascular:  no chest pain  Resp: no SOB  GI: no vomiting GU: no dysuria Integumentary: no rash  Allergy: no hives  Musculoskeletal: no leg swelling  Neurological: no slurred speech ROS otherwise negative  PAST MEDICAL HISTORY/PAST SURGICAL HISTORY:  Past Medical History:  Diagnosis Date  . Atrial fibrillation (HCC)   . Cardiomyopathy (HCC)   . Cat scratch fever    removed mass on right side neck  . Cataracts, bilateral   . CHF (congestive heart failure) (HCC)   . Chronic kidney disease   . Corns and callosities   . Diabetes mellitus without complication (HCC)    TYPE 2  . Dyspnea   . Erythema intertrigo   . Flat foot   . Gout   . High cholesterol   . Hx of urethral stricture   . Hypertension   . Kidney stones   . Morbid obesity (HCC)   . Nonischemic cardiomyopathy (HCC)    a. ? 2009 Cath in [place] - nl cors per pt;  b. 09/2013 Echo: EF 25-30%, sev diff HK.  . Obesity   . Osteoarthritis   . PAF (paroxysmal atrial fibrillation) (HCC)    a. post-op hip in 2014 - prev on xarelto.  . Renal insufficiency     MEDICATIONS:  Prior to Admission medications   Medication Sig Start Date End Date Taking? Authorizing Provider  allopurinol (ZYLOPRIM) 100 MG tablet  Take 200 mg by mouth daily as needed (for gout symptoms).     [provider]  amiodarone (PACERONE) 200 MG tablet Take 1 tablet (200 mg total) by mouth daily. 01/09/19   Graciella Freer, PA-C  apixaban (ELIQUIS) 2.5 MG TABS tablet Take 1 tablet (2.5 mg total) by mouth 2 (two) times daily. Patient taking differently: Take 2.5 mg by mouth 2 (two) times daily. Caution - blood thinner 05/04/18   Angelita Ingles, MD  cyanocobalamin 1000 MCG tablet Take 1 tablet (1,000 mcg total) by mouth daily. 03/13/19   Laurey Morale, MD  diclofenac sodium (VOLTAREN) 1 % GEL Apply 4 g topically 4 (four) times daily. 07/10/17   Valentino Nose, MD  docusate sodium (COLACE) 100 MG capsule Take 100 mg by mouth 2 (two) times daily.    [provider]  finasteride (PROSCAR) 5 MG tablet Take 1 tablet (5 mg total) by mouth daily. 11/29/18   Alford Highland, NP  fluticasone furoate-vilanterol (BREO ELLIPTA) 100-25 MCG/INH AEPB Inhale 1 puff into the lungs daily. Patient not taking: Reported on 03/08/2019 05/05/18   Angelita Ingles, MD  glipiZIDE (GLUCOTROL) 5 MG tablet Take 5 mg by mouth daily before breakfast.    [provider]  hydrALAZINE (APRESOLINE) 25 MG tablet  Take 1 tablet (25 mg total) by mouth 3 (three) times daily. 03/13/19   Laurey Morale, MD  HYDROcodone-acetaminophen (NORCO/VICODIN) 5-325 MG tablet Take 2 tablets by mouth every 4 (four) hours as needed. Patient taking differently: Take 2 tablets by mouth every 4 (four) hours as needed for moderate pain.  04/09/16   Isa Rankin, MD  ipratropium-albuterol (DUONEB) 0.5-2.5 (3) MG/3ML SOLN Take 3 mLs by nebulization every 4 (four) hours as needed. Patient not taking: Reported on 03/08/2019 05/14/17   Pearson Grippe, MD  isosorbide mononitrate (IMDUR) 30 MG 24 hr tablet Take 0.5 tablets (15 mg total) by mouth daily. 12/12/18   Graciella Freer, PA-C  ketoconazole (NIZORAL) 2 % shampoo Apply 1 application topically 2 (two) times a  week.    [provider]  magnesium oxide (MAG-OX) 400 MG tablet Take 400 mg by mouth daily.    [provider]  nitroGLYCERIN (NITROSTAT) 0.4 MG SL tablet Place 1 tablet (0.4 mg total) under the tongue every 5 (five) minutes x 3 doses as needed for chest pain. Patient not taking: Reported on 02/07/2019 08/19/18   Laverda Page B, NP  potassium chloride SA (K-DUR,KLOR-CON) 20 MEQ tablet Take 20 mEq by mouth daily.    [provider]  rosuvastatin (CRESTOR) 10 MG tablet Take 1 tablet (10 mg total) by mouth daily at 6 PM. 12/22/18   [name], MD  sertraline (ZOLOFT) 25 MG tablet Take 25 mg by mouth daily.     [provider]  spironolactone (ALDACTONE) 25 MG tablet Take 0.5 tablets (12.5 mg total) by mouth daily. 01/09/19   Graciella Freer, PA-C  torsemide (DEMADEX) 20 MG tablet Take 2 tablets (40 mg total) by mouth daily. 03/11/19   Lorenso Courier, MD    ALLERGIES:  Allergies  Allergen Reactions  . Lisinopril Swelling    Facial swelling  . Xarelto [Rivaroxaban] Other (See Comments)    "Blood came out of my penis"    SOCIAL HISTORY:  Social History   Tobacco Use  . Smoking status: Former Smoker    Packs/day: 1.00    Years: 15.00    Pack years: 15.00    Types: Cigarettes, Cigars  . Smokeless tobacco: Former Neurosurgeon  . Tobacco comment: quit smoking ~ 50 yr ago  Substance Use Topics  . Alcohol use: No    Alcohol/week: 2.0 standard drinks    Types: 2 Cans of beer per week    Comment: rare beer.    FAMILY HISTORY: Family History  Problem Relation Age of Onset  . Heart disease Mother   . Heart Problems Maternal Grandmother   . Other Other        negative for premature CAD    EXAM: BP 140/67 (BP Location: Right Arm)   Pulse 75   Temp 98.7 F (37.1 C) (Oral)   Resp 18   Ht 5\' 7"  (1.702 m)   Wt 90.3 kg   SpO2 98%   BMI 31.17 kg/m  CONSTITUTIONAL: Alert and oriented and responds appropriately to questions.  Afebrile,  well-nourished, appears younger than stated age, does appear uncomfortable but not in distress HEAD: Normocephalic EYES: Conjunctivae clear, pupils appear equal, EOMI ENT: normal nose; moist mucous membranes NECK: Supple, no meningismus, no nuchal rigidity, no LAD  CARD: RRR; S1 and S2 appreciated; no murmurs, no clicks, no rubs, no gallops RESP: Normal chest excursion without splinting or tachypnea; breath sounds clear and equal bilaterally; no wheezes, no rhonchi, no rales, no  hypoxia or respiratory distress, speaking full sentences ABD/GI: Normal bowel sounds; non-distended; soft, non-tender, no rebound, no guarding, no peritoneal signs, no hepatosplenomegaly BACK:  The back appears normal and is mildly tender to palpation over the left lower lumbar paraspinal muscles without redness, warmth, ecchymosis or swelling.  No rash.  No midline spinal tenderness or step-off or deformity. EXT: Patient has some tenderness over the posterior left hip.  He is unable to flex his hip secondary to pain.  Otherwise normal range of motion in the joints of the left lower extremity.  He has no redness or warmth in the left lower extremity.  2+ DP pulses bilaterally.  Normal sensation in all 4 extremities.  Compartments are soft.  There is no calf swelling or calf tenderness on exam.  Extremity warm and well-perfused. SKIN: Normal color for age and race; warm; no rash NEURO: Moves all extremities equally except some diminished hip flexion in the left lower extremity secondary to pain and reports normal sensation throughout both lower extremities, no saddle anesthesia, no clonus PSYCH: The patient's mood and manner are appropriate. Grooming and personal hygiene are appropriate.  MEDICAL DECISION MAKING: Patient here with left hip and left lower back pain.  It is hard to tell if the pain is coming from the joint or if it is coming for the paraspinal muscles.  He has no focal neurologic deficits on exam.  The leg does  not show any sign of gout, septic arthritis, cellulitis, compartment syndrome.  Doubt DVT, arterial obstruction.  He is neurovascular intact distally.  This may be from muscle spasm from the back, sciatica, arthritis.  X-ray of the hip obtained in triage shows that he has had a left hip replacement and there is no acute abnormality noted.  Will obtain basic labs today.  Will treat pain with fentanyl, Robaxin.  ED PROGRESS: Patient still in a significant amount of pain and unable to even move much in the bed.  Will give morphine.  His labs show that he has acute on chronic renal failure.  His baseline creatinine is around 2 and today is 3.47.  He does not appear overloaded on exam.  He appears actually slightly dry.  Will give 500 mL IV fluid bolus.  He was just admitted for acute on chronic renal failure to the internal medicine service and had his torsemide decreased from 40 mg twice daily to 40 mg once daily.  Unclear why he has elevated creatinine today.  Potassium also slightly elevated at 5.2.  I feel he will need admission for this to be monitored.  Also feel patient would benefit from further pain control, PT, possible MRI not emergently of the hip.  3:57 AM Discussed patient's case with IM resident.  I have recommended admission and patient (and family if present) agree with this plan. Admitting physician will place admission orders.   I reviewed all nursing notes, vitals, pertinent previous records, EKGs, lab and urine results, imaging (as available).       Ward, Layla Maw, DO 03/14/19 [phone]

## 2019-03-14 NOTE — Evaluation (Signed)
Physical Therapy Evaluation & Discharge Patient Details Name: Ryan Macdonald MRN: [id number] DOB: 1934-10-09 Today's Date: 03/14/2019   History of Present Illness  Pt is an 85 y/o male admitted secondary to L flank and L hip pain. X-Ray reveals no acute abnormalities. Pt found to have an AKI. PMH including but not limited to DM, CHF, HTN and PAF.    Clinical Impression  Pt presented supine in bed with HOB elevated, awake and willing to participate in therapy session. Pt very particular and reporting that he knows all of the exercises he needs to do for his therapy. Pt able to perform bed mobility and reposition with mod I. Pt refusing to further participate in any OOB mobility at this time. PT provided max education on importance of mobility while admitted. Pt expressed understanding. No further acute PT needs identified at this time. PT signing off. Pt requesting no f/u PT while admitted.     Follow Up Recommendations No PT follow up;Other (comment)(pt refusing)    Equipment Recommendations  None recommended by PT    Recommendations for Other Services       Precautions / Restrictions Precautions Precautions: Fall Restrictions Weight Bearing Restrictions: No      Mobility  Bed Mobility Overal bed mobility: Modified Independent                Transfers                 General transfer comment: pt declining  Ambulation/Gait                Stairs            Wheelchair Mobility    Modified Rankin (Stroke Patients Only)       Balance                                             Pertinent Vitals/Pain Pain Assessment: Faces Faces Pain Scale: Hurts a little bit Pain Location: L hip Pain Descriptors / Indicators: Sore    Home Living Family/patient expects to be discharged to:: Private residence Living Arrangements: Spouse/significant other Available Help at Discharge: Family Type of Home: Apartment Home Access: Stairs to  enter   Secretary/administrator of Steps: 4 Home Layout: One level        Prior Function Level of Independence: Needs assistance   Gait / Transfers Assistance Needed: uses RW/cane PRN  ADL's / Homemaking Assistance Needed: sometimes requires assistance with bathing        Hand Dominance        Extremity/Trunk Assessment   Upper Extremity Assessment Upper Extremity Assessment: Overall WFL for tasks assessed    Lower Extremity Assessment Lower Extremity Assessment: Overall WFL for tasks assessed       Communication   Communication: No difficulties  Cognition Arousal/Alertness: Awake/alert Behavior During Therapy: WFL for tasks assessed/performed Overall Cognitive Status: Within Functional Limits for tasks assessed                                        General Comments      Exercises General Exercises - Lower Extremity Heel Slides: AROM;Left;5 reps;Supine   Assessment/Plan    PT Assessment Patent does not need any further PT services  PT Problem List Decreased mobility  PT Treatment Interventions      PT Goals (Current goals can be found in the Care Plan section)  Acute Rehab PT Goals Patient Stated Goal: "I'll do my own therapy" PT Goal Formulation: All assessment and education complete, DC therapy    Frequency     Barriers to discharge        Co-evaluation               AM-PAC PT "6 Clicks" Mobility  Outcome Measure Help needed turning from your back to your side while in a flat bed without using bedrails?: A Little Help needed moving from lying on your back to sitting on the side of a flat bed without using bedrails?: A Little Help needed moving to and from a bed to a chair (including a wheelchair)?: A Little Help needed standing up from a chair using your arms (e.g., wheelchair or bedside chair)?: A Little Help needed to walk in hospital room?: A Little Help needed climbing 3-5 steps with a railing? : A Little 6  Click Score: 18    End of Session   Activity Tolerance: Patient tolerated treatment well Patient left: in bed;with call bell/phone within reach Nurse Communication: Mobility status PT Visit Diagnosis: Other abnormalities of gait and mobility (R26.89)    Time: 9373-4287 PT Time Calculation (min) (ACUTE ONLY): 64 min   Charges:   PT Evaluation $PT Eval Moderate Complexity: 1 Mod PT Treatments $Self Care/Home Management: 38-52        Deborah Chalk, PT, DPT  Acute Rehabilitation Services Pager [phone] Office [phone]    Alessandra Bevels Cindee Mclester 03/14/2019, 5:18 PM

## 2019-03-14 NOTE — ED Triage Notes (Signed)
Per EMS, Pt experienced sudden onset left sided hip pain.  He was able to slowly make it to the bed w/ a the use of a walker.  Pt was discharged for kidney failure on Sunday.  Denies fever, SOB or contact w/ COVID pt.

## 2019-03-14 NOTE — ED Notes (Signed)
Report called  

## 2019-03-14 NOTE — Progress Notes (Signed)
Subjective: Patient was seen and evaluated at bedside on morning rounds. He admitted last night due to flank pain. He says that the pain started suddenly. It is an intermittent pain and is spastic. Denies any No acute events overnight.    Objective:  Vital signs in last 24 hours: Vitals:   03/14/19 0500 03/14/19 0530 03/14/19 0700 03/14/19 0825  BP: 137/70 129/69 132/72 131/69  Pulse: 74 70 67 65  Resp:    18  Temp:    98.3 F (36.8 C)  TempSrc:    Oral  SpO2: 97% 95% 96% 97%  Weight:      Height:       Ph/e: General: laying in the bed in no acute distress CV: RRR, normal S1-S2, 2/6 systolic murmur at LSB, no JVD Lung exam: Normal work of breathing, anterior and lateral chest exam without any crackle, wheeze Abdomen: soft, nontender, BS is present no erythema, swelling or rash on his flank.  No CVA angle tenderness on exam. No CVA tenderness. Extremities: no LEE   Assessment/Plan:  Active Problems:   AKI (acute kidney injury) (HCC)  Ryan Macdonald is a 85 yo M w/ PMH of HFrEF (EF 20-25%), CAD s/p ICD, A.fib on Eliquis, T2DM, HTN, CAD s/p LAD stent and chronic hypoxic respiratory failure presenting with left flank pain and found to have acute on chronic renal failure. (Admit creatinine 3.47.  Baseline creatinine 1.8-2.0.)   AKI on CKD: Patient had very recent admission due to back pain and also found to have acute on chronic kidney injury.   -Status post low rate IV fluids at 100 mL/h for 10 hours -Holding home torsemide -Avoid for toxic medications -Trend BMP (next BMP 5 pm today)  Left flank pain/left hip pain: Patient reports intermittent spastic localize pain at his left lower back.  He denies any change in his urine, specifically no hematuria or dysuria or difficulty urination.  He reports that he had history of nephrolithiasis. Reports that the back pain he had on last hospitalization was different than this current pain.  Hip X-ray at ED showed no acute  abnormality.  He has localized tenderness at left lower back . No fever, diminished soft and nontender to palpation, no erythema, swelling or rash on his flank.  No CVA angle tenderness on exam. Likely MSK. Givving Robaxin (ports previous improvement with that in ED) and lidocaine patch.  Intrarenal etiology is on differential.    Will hold further imaging due to having recent imaging. Ct abdomen at previous admission 03/08/2019: Atrophic right kidney with multiple nonobstructing calculi, measuring up to 2.9 cm.  U/S: 1. Atrophic right kidney with multiple nonobstructive calculi, largest of which measures 2.6 cm at the upper pole. Appearance is stable from prior CT. No hydronephrosis. 2. Two simple left renal cysts measuring up to 1.9 cm as above. Otherwise normal left kidney without hydronephrosis.  Checking UA to evaluate for any urinary tract infection, presence of casts.  -U/A -lidocain patch -Robaxin 500 mg IV once -Robaxin 500 mg PO q6h PRN  Chronic systolic heart failure EF 20-25%. No evidence of volume overload.  -Holding diuretic due to AKI -Continue home med, Imdur 15mg  daily  HTN: ON HCTZ 25 mg QD at home -Continue home meds: hydralazine 25mg  TID  HLD -c/w home meds: rosuvastatin 10mg  daily  BPH -c/w home meds: finasteride 5mg  daily  Anxiety -c/w home meds: sertraline 25mg  daily  DVT prophx: Eliquis Diet: Cardiac/Renal Bowel: Senokot Code: Full  Dispo: Anticipated discharge in approximately  3-4 days  Ryan Pretty, MD 03/14/2019, 12:31 PM Pager: [phone]

## 2019-03-14 NOTE — TOC Initial Note (Signed)
Transition of Care [facility]) - Initial/Assessment Note    Patient Details  Name: Ryan Macdonald MRN: [id number] Date of Birth: 1934-01-07  [facility] CM/SW Contact:    [name] Phone Number: [phone] 03/14/2019, 10:20 AM  Clinical Narrative:                 03/14/2019 - CM following for progression of care; noted admitted 3 times in 6 month; goes to the [facility] for medical care and medication. [name] RN,MHA,BSN  Action/Plan: 11/27/2018- Patient is very well known to me from previous admission; Noted admitted 7 times in 6 months; CM will continue to follow for progression of care; [name] [phone]  Action/Plan: 09/25/2018 - Patient well known to me from previous admission;Noted, admitted 6 times in 6 months; [facility]; CM will continue to follow for progression of care.[name] with the [place] [phone] ext [phone]) following pt for disposition needs;[name] [phone]  05/02/2018 [name] at home with his spouse; goes to the [facility] for medical Care. Patient goes to the [facility] for primary care and for his medication. [phone]- follow the prompts #1 [facility], Dr Harrie Jeans # [phone]. Patient stated that if his prescriptions are sent today, they will send him the medication the next day; Attending MD, please put the patient last 4 numbers of his social security number on the prescription for the [facility] to identify the patient; ( [id number]).[name] [phone]  Expected Discharge Plan: Home/Self Care Barriers to Discharge: No Barriers Identified   Patient Goals and CMS Choice Patient states their goals for this hospitalization and ongoing recovery are:: to stay at home CMS Medicare.gov Compare Post Acute Care list provided to:: Patient Choice offered to / list presented to : NA  Expected Discharge Plan and Services Expected Discharge Plan: Home/Self Care In-house  Referral: NA Discharge Planning Services: CM Consult Post Acute Care Choice: NA Living arrangements for the past 2 months: Single Family Home                 DME Arranged: N/A DME Agency: NA HH Arranged: NA HH Agency: NA  Prior Living Arrangements/Services Living arrangements for the past 2 months: Single Family Home Lives with:: Spouse Patient language and need for interpreter reviewed:: No Do you feel safe going back to the place where you live?: Yes      Need for Family Participation in Patient Care: No (Comment)     Criminal Activity/Legal Involvement Pertinent to Current Situation/Hospitalization: No - Comment as needed  Activities of Daily Living Home Assistive Devices/Equipment: Environmental consultant (specify type), Wheelchair, Medical laboratory scientific officer (specify quad or straight), Grab bars in shower ADL Screening (condition at time of admission) Patient's cognitive ability adequate to safely complete daily activities?: Yes Is the patient deaf or have difficulty hearing?: No Does the patient have difficulty seeing, even when wearing glasses/contacts?: No Does the patient have difficulty concentrating, remembering, or making decisions?: No Patient able to express need for assistance with ADLs?: Yes Does the patient have difficulty dressing or bathing?: No Independently performs ADLs?: Yes (appropriate for developmental age) Does the patient have difficulty walking or climbing stairs?: Yes Weakness of Legs: Both Weakness of Arms/Hands: None  Permission Sought/Granted Permission sought to share information with : Case Manager Permission granted to share information with : Yes, Verbal Permission Granted  Share Information with NAME: spouse [name]           Emotional Assessment Appearance:: Appears older than stated age Attitude/Demeanor/Rapport:  Gracious, Engaged Affect (typically observed): Accepting Orientation: : Oriented to Self, Oriented to  Time, Oriented to Place, Oriented to Situation Alcohol /  Substance Use: Not Applicable Psych Involvement: No (comment)  Admission diagnosis:  Left hip pain [M25.552] AKI (acute kidney injury) (HCC) [N17.9] Patient Active Problem List   Diagnosis Date Noted  . AKI (acute kidney injury) (HCC) 03/14/2019  . Acute renal failure (ARF) (HCC) 03/09/2019  . Transaminitis   . Acute on chronic renal failure (HCC) 03/08/2019  . Acute exacerbation of CHF (congestive heart failure) (HCC) 11/23/2018  . Acute on chronic heart failure (HCC) 10/19/2018  . Pulmonary emphysema (HCC)   . Chronic respiratory failure with hypoxia (HCC)   . Morbid obesity (HCC) 09/09/2018  . URI (upper respiratory infection) 08/31/2018  . UTI (urinary tract infection) 08/31/2018  . Chronic systolic (congestive) heart failure (HCC) 08/28/2018  . CHF (congestive heart failure) (HCC) 08/14/2018  . Acute on chronic combined systolic and diastolic CHF (congestive heart failure) (HCC) 07/18/2018  . First degree AV block   . Shortness of breath 04/29/2018  . Acute HFrEF (heart failure with reduced ejection fraction) (HCC) 04/29/2018  . Acute respiratory failure with hypoxia (HCC) 09/27/2017  . CHF exacerbation (HCC) 09/27/2017  . Renal insufficiency   . Acute on chronic systolic CHF (congestive heart failure) (HCC) 07/08/2017  . Left knee pain 07/08/2017  . Acute bronchitis 05/10/2017  . Acute congestive heart failure (HCC) 04/14/2017  . Elevated troponin 04/14/2017  . Lactic acidosis   . Acute on chronic systolic heart failure, NYHA class 2 (HCC) 02/03/2017  . Diabetes mellitus type 2 in obese (HCC) 02/03/2017  . CAP (community acquired pneumonia) 02/03/2017  . Lobar pneumonia (HCC)   . Diabetes mellitus with complication (HCC)   . Acute on chronic respiratory failure with hypoxia (HCC)   . Acute on chronic combined systolic and diastolic heart failure (HCC) 05/19/2016  . Depression 05/19/2016  . Gout 05/19/2016  . ICD (implantable cardioverter-defibrillator) in place  12/17/2015  . NSVT (nonsustained ventricular tachycardia) (HCC)   . Chronic renal insufficiency, stage 3 (moderate) (HCC)   . Pulmonary embolism (HCC)   . Acute respiratory failure (HCC)   . Osteoarthritis of left hip 10/25/2013  . Atrial fibrillation (HCC) 10/25/2013  . S/P hip replacement 10/24/2013  . Nonischemic cardiomyopathy (HCC)   . Obesity (BMI 30-39.9)   . Essential hypertension   . Hyperlipidemia    PCP:  [facility]:   [facility] [place], [place] - [address] [place] [place] [address] Phone: [phone] Fax: [phone]     Social Determinants of Health (SDOH) Interventions    Readmission Risk Interventions No flowsheet data found.

## 2019-03-15 ENCOUNTER — Other Ambulatory Visit (HOSPITAL_COMMUNITY): Payer: Self-pay

## 2019-03-15 LAB — BASIC METABOLIC PANEL
Anion gap: 11 (ref 5–15)
BUN: 31 mg/dL — ABNORMAL HIGH (ref 8–23)
CO2: 24 mmol/L (ref 22–32)
Calcium: 8.4 mg/dL — ABNORMAL LOW (ref 8.9–10.3)
Chloride: 104 mmol/L (ref 98–111)
Creatinine, Ser: 2.49 mg/dL — ABNORMAL HIGH (ref 0.61–1.24)
GFR calc Af Amer: 27 mL/min — ABNORMAL LOW (ref >60)
GFR calc non Af Amer: 23 mL/min — ABNORMAL LOW (ref >60)
Glucose, Bld: 83 mg/dL (ref 70–99)
Potassium: 4.3 mmol/L (ref 3.5–5.1)
Sodium: 139 mmol/L (ref 135–145)

## 2019-03-15 LAB — GLUCOSE, CAPILLARY
Glucose-Capillary: 78 mg/dL (ref 70–99)
Glucose-Capillary: 104 mg/dL — ABNORMAL HIGH (ref 70–99)

## 2019-03-15 MED ORDER — POLYETHYLENE GLYCOL 3350 17 G PO PACK
34.0000 g | PACK | Freq: Every day | ORAL | Status: DC
  Administered 2019-03-15: 34 g via ORAL
  Filled 2019-03-15: qty 2

## 2019-03-15 MED ORDER — POTASSIUM CHLORIDE CRYS ER 20 MEQ PO TBCR: 20.0000 meq | EXTENDED_RELEASE_TABLET | Freq: Every day | ORAL | 0 refills | Status: DC

## 2019-03-15 MED ORDER — METHOCARBAMOL 500 MG PO TABS: 500.0000 mg | ORAL_TABLET | Freq: Four times a day (QID) | ORAL | 0 refills | Status: DC | PRN

## 2019-03-15 MED ORDER — SPIRONOLACTONE 25 MG PO TABS: 12.5000 mg | ORAL_TABLET | Freq: Every day | ORAL | 6 refills | Status: DC

## 2019-03-15 MED ORDER — TORSEMIDE 20 MG PO TABS: 40.0000 mg | ORAL_TABLET | Freq: Every day | ORAL | 3 refills | Status: DC

## 2019-03-15 MED FILL — METHOCARBAMOL 500 MG TABLET: 500 | 7 days supply | Qty: 30 | Fill #0

## 2019-03-15 NOTE — Discharge Summary (Addendum)
Name: Ryan Macdonald MRN: [id number] DOB: 1934-07-09 85 y.o. PCP: [facility]  Date of Admission: 03/14/2019 12:48 AM Date of Discharge: 03/15/2019 Attending Physician: Lenice Pressman  Discharge Diagnosis: Acute problems:  Spasm of muscles of lower back AKI on CKD  Discharge Medications: Allergies as of 03/15/2019      Reactions   Lisinopril Swelling   Facial swelling   Xarelto [rivaroxaban] Other (See Comments)   "Blood came out of my penis"      Medication List    TAKE these medications   allopurinol 100 MG tablet Commonly known as:  ZYLOPRIM Take 200 mg by mouth daily as needed (for gout symptoms).   amiodarone 200 MG tablet Commonly known as:  PACERONE Take 1 tablet (200 mg total) by mouth daily.   apixaban 2.5 MG Tabs tablet Commonly known as:  ELIQUIS Take 1 tablet (2.5 mg total) by mouth 2 (two) times daily. What changed:  additional instructions   cyanocobalamin 1000 MCG tablet Take 1 tablet (1,000 mcg total) by mouth daily.   diclofenac sodium 1 % Gel Commonly known as:  VOLTAREN Apply 4 g topically 4 (four) times daily.   docusate sodium 100 MG capsule Commonly known as:  COLACE Take 100 mg by mouth 2 (two) times daily.   finasteride 5 MG tablet Commonly known as:  PROSCAR Take 1 tablet (5 mg total) by mouth daily.   fluticasone furoate-vilanterol 100-25 MCG/INH Aepb Commonly known as:  BREO ELLIPTA Inhale 1 puff into the lungs daily.   glipiZIDE 5 MG tablet Commonly known as:  GLUCOTROL Take 5 mg by mouth daily before breakfast.   hydrALAZINE 25 MG tablet Commonly known as:  APRESOLINE Take 1 tablet (25 mg total) by mouth 3 (three) times daily.   HYDROcodone-acetaminophen 5-325 MG tablet Commonly known as:  NORCO/VICODIN Take 2 tablets by mouth every 4 (four) hours as needed. What changed:  reasons to take this   ipratropium-albuterol 0.5-2.5 (3) MG/3ML Soln Commonly known as:  DUONEB Take 3 mLs by nebulization every 4  (four) hours as needed.   isosorbide mononitrate 30 MG 24 hr tablet Commonly known as:  IMDUR Take 0.5 tablets (15 mg total) by mouth daily.   ketoconazole 2 % shampoo Commonly known as:  NIZORAL Apply 1 application topically 2 (two) times a week. Notes to patient:  Use as directed 2 times a week   magnesium oxide 400 MG tablet Commonly known as:  MAG-OX Take 400 mg by mouth daily.   methocarbamol 500 MG tablet Commonly known as:  ROBAXIN Take 1 tablet (500 mg total) by mouth every 6 (six) hours as needed for muscle spasms.   nitroGLYCERIN 0.4 MG SL tablet Commonly known as:  NITROSTAT Place 1 tablet (0.4 mg total) under the tongue every 5 (five) minutes x 3 doses as needed for chest pain.   potassium chloride SA 20 MEQ tablet Commonly known as:  K-DUR Take 1 tablet (20 mEq total) by mouth daily. Start taking on:  March 23, 2019 What changed:  These instructions start on March 23, 2019. If you are unsure what to do until then, ask your doctor or other care provider.   rosuvastatin 10 MG tablet Commonly known as:  CRESTOR Take 1 tablet (10 mg total) by mouth daily at 6 PM.   sertraline 25 MG tablet Commonly known as:  ZOLOFT Take 25 mg by mouth daily.   spironolactone 25 MG tablet Commonly known as:  ALDACTONE Take 0.5 tablets (12.5 mg  total) by mouth daily. Start taking on:  March 23, 2019 What changed:  These instructions start on March 23, 2019. If you are unsure what to do until then, ask your doctor or other care provider.   torsemide 20 MG tablet Commonly known as:  DEMADEX Take 2 tablets (40 mg total) by mouth daily. Start taking on:  March 23, 2019 What changed:  These instructions start on March 23, 2019. If you are unsure what to do until then, ask your doctor or other care provider.       Disposition and follow-up:   [name] was discharged from [facility] in Stable condition.  At the hospital follow up visit please address:   1. Patient presented with left lower back pain likely due to a muscle spasm. His pain significantly improved with Methocarbamol, lidocaine patch, and continuing home dose of PO Oxycodone. Please evaluate his symptoms.  2. Patient has HFrEF. Home Torsemide and Spironolactone held during this hospitalization due to dehydration and AKI on CKD. He is euvolumic at discharge. Please evaluate volume status, check BMP and resume/adjust diuretics as needed at hospital f/u visit.   3. Patient with CKD III, presented with AKI on CKD. Previous U/S and CT scan showed atrophic rt kidney, and left kidney cysts. He seems to be more susceptible to severe AKI recently, as he had AKI on recent hospitalization (a week ago). Please ensure nephrology consultation.   4. He has elevated inflammatory markers of unclear etiology. Please repeat at f/u visit.  5.  Labs / imaging needed at time of follow-up: BMP, UA, ESR, CRP  6.  Pending labs/ test needing follow-up: None  Follow-up Appointments:  Hospital Course by problem list: [name] is an 85 yo M w/ PMH of HFrEF (EF 20-25%), ICD, CAD s/p stent, A.fib on Eliquis, T2DM, HTN, who presented with left low back pain.  Denies any fever, urinary symptoms, trauma, or injury to the area.  He had a recent hospitalization and discharge a few days before this admission with similar presentation.  He had localized tenderness in his left lower back/upper buttock, without any fever and no CVA angle tenderness.  Strength and sensation was normal in both legs.  X-ray of his left hips showed appropriate positioning of his hip replacement and no abnormalities. Most likely muscle spasm.  His pain significantly improved with lidocaine patch, methocarbamol, and oxycodone/acetaminophen, and was discharged with these medicines.  He declined to work with PT.   2 & 3: AKI on CKD III Chronic systolic heart failure: EF 20-25%.   He again presented with AKI, similar to his prior admission.  After last admission, he had cut torsemide to once daily as requested. Unclear etiology, but likely volume depletion as he responded to IV fluids with creatinine trend 3.5->3.2->2.8->2.5. He should not take his diuretics until he has follow up with heart failure clinic next week.   6. Asymptomatic pyuria and hematuria, afebrile. He has known non-obstructing kidney stones, no further workup needed at this time, but recommend UA to ensure these resolve.  Discharge Vitals:   BP 115/64 (BP Location: Left Arm)   Pulse 77   Temp 98.5 F (36.9 C) (Oral)   Resp 20   Ht _0  (1.702 m)   Wt 94.2 kg   SpO2 96%   BMI 32.53 kg/m   Pertinent Labs, Studies, and Procedures:  BMP Latest Ref Rng & Units 03/15/2019 03/14/2019 03/14/2019  Glucose 70 - 99 mg/dL 83 101(H) 94  BUN 8 - 23 mg/dL 31(H) 37(H) 38(H)  Creatinine 0.61 - 1.24 mg/dL 2.49(H) 2.80(H) 3.24(H)  BUN/Creat Ratio 10 - 24 - - -  Sodium 135 - 145 mmol/L 139 139 140  Potassium 3.5 - 5.1 mmol/L 4.3 5.1 5.4(H)  Chloride 98 - 111 mmol/L 104 105 103  CO2 22 - 32 mmol/L _0 Calcium 8.9 - 10.3 mg/dL 8.4(L) 8.6(L) 8.9   DG left hip: 03/14/2019  IMPRESSION: Left hip replacement. No acute abnormality noted.  Urinalysis    Component Value Date/Time   COLORURINE YELLOW 03/14/2019 1800   APPEARANCEUR HAZY (A) 03/14/2019 1800   LABSPEC 1.012 03/14/2019 1800   PHURINE 5.0 03/14/2019 1800   GLUCOSEU NEGATIVE 03/14/2019 1800   HGBUR SMALL (A) 03/14/2019 1800   BILIRUBINUR NEGATIVE 03/14/2019 1800   KETONESUR NEGATIVE 03/14/2019 1800   PROTEINUR NEGATIVE 03/14/2019 1800   UROBILINOGEN 0.2 05/26/2015 1521   NITRITE NEGATIVE 03/14/2019 1800   LEUKOCYTESUR LARGE (A) 03/14/2019 1800     Discharge Instructions: Discharge Instructions    Diet - low sodium heart healthy   Complete by:  As directed    Discharge instructions   Complete by:  As directed    Please hold Torsemide, Propiolactone until you see Dr. Deatra Robinson next Thursday. As we  discussed, if you developed shortness of breath meanwhile, it is ok to take Torsemide (and potassium pill with it) and contact your doctor. Make sure to follow your appointment. Take Robaxin (Methocarbamol) as instructed for you for back/flank pain. Continue rest of your medications as before. We contacted your PCP, Dr. Renelda Mom about your hospitalization and she is update about discharge plan.  You can call her if you needed more medication for your muscle pain.  We recommend to see a nephrologist. Dr. Renelda Mom will reffer you to one.  Please call us at [phone] if having question or concern. Thank you   Increase activity slowly   Complete by:  As directed       Signed: Dewayne Hatch, MD 03/16/2019, 1:45 PM   Pager: [phone]    Internal Medicine Attending Note:  I saw and examined the patient on the day of discharge. I reviewed and agree with the discharge summary written by the house staff.  Lenice Pressman, M.D., Ph.D.

## 2019-03-15 NOTE — Progress Notes (Signed)
Came out today post d/c. Pt had several med changes--the d/c summary wasn't finished yet in Epic but going by his d/c papers he is to hold his torsemide, spiro and potassium until he has labs and virtual visit next week.. so those meds were removed from his pill box.   [name], EMT-Paramedic  03/15/19

## 2019-03-15 NOTE — Progress Notes (Signed)
Patient being discharged this date from unit to home with wife providing transportation. Patient makes no c/o and exhibits no s/sx of discomfort or distress. All discharge instructions reviewed with pt and follow up appts stressed in an effort to avoid frequent hospital admissions.  All personal belongings with patient.

## 2019-03-15 NOTE — Progress Notes (Signed)
Subjective: Patient was seen and evaluated at bedside on morning rounds. His flank pain is much better today. He report normal urine. No other complaint. We discussed about he will need to be seen by nephrologist, and also needs a follow-up appointment with heart failure clinic after discharge. All of his questions and concerns were addressed.  He agrees with plan.  Objective:  Vital signs in last 24 hours: Vitals:   03/14/19 1228 03/14/19 1300 03/14/19 2053 03/15/19 0428  BP: 138/72 127/65 122/62 122/61  Pulse:  66 65 65  Resp:  20 16 20   Temp:  97.7 F (36.5 C) 97.7 F (36.5 C) 97.9 F (36.6 C)  TempSrc:  Oral Oral Oral  SpO2:  91% 94% 94%  Weight: 94.2 kg   94.2 kg  Height:       Ph/E: General: Well-developed, well-nourished, in no acute distress CV: RRR, nl S1S2, JVP is normal.  Lung exam: Normal work of breathing, no crackle, wheeze Abdomen: soft, nontender, BS is present, no CVA tenderness. No major tenderness on lower left back today Extremities: no LEE Neurologic exam: Alert and oriented x3  Assessment/Plan:  Principal Problem:   AKI (acute kidney injury) (HCC) Active Problems:   Atrial fibrillation (HCC)   Hyperkalemia   Diabetes mellitus type 2 in obese (HCC)   Spasm of muscle of lower back   Assessment/Plan:  Active Problems:  AKI (acute kidney injury) (HCC)  Mr.Bangert is a 85 yo M w/ PMH of HFrEF (EF 20-25%), CAD s/p ICD, A.fib on Eliquis, T2DM, HTN, CAD s/p LAD stent and chronic hypoxic respiratory failure presenting with left flank pain and found to have acute on chronic renal failure. (Admit creatinine 3.47.  Baseline creatinine 1.8-2.0.)   AKI on CKD: Patient had very recent admission due to back pain and also found to have acute on chronic kidney injury.   Improved. Likely due to high dose of diuretic he is on for sever HFrEF. Held diuretics and was given low rate of IV fluid on arrival and Cr improved to 2.49 today (from 3.24 on  arrival) He will need Nephrology visit after discharge. Will updated his PCP. Discharge home today. Recommended to hold diuretics until be seen in HF clinic next week. He can take Torsemide meanwhile if develops SOB. -DC home -Dr. Nelson Chimes talk to his PCP at [facility]. Dr. Tommy Rainwater and gave her complete update about patient hospitalization course and discharge plan. Dr. Tommy Rainwater will arrange referral to nephrologist (at [facility]).  -Holding home Torsemide and Spironolactone after discharge -F/u outpatient for BMP   Left flank pain/left hip pain:  Seems to be MSK  He has spastic local pain. No urinary symptoms, no fever. Has localized tenderness at left lower back . No fever, diminished soft and nontender to palpation, no erythema, swelling or rash on his flank.  No CVA angle tenderness on exam.  He reports significant improvement with Methocarbamol and Lidocaine patch  -DC home with Methocarbamol 500 mg PO q6h PRN n=30. Sent prescription to [facility] pharmacy. They will involve case management if the the price is high since patient has no insurance. PCP informed and mentioned patient can contact her when she finished the meds and she may refill that as needed. Also mentioned that if patient need more Robaxin, he can contacted her and asks for refill. Also she has sent prescription for Norco for him yesterday that should be ready in [place] pharmacy today/tomorrow. I gave Mr. Anger this information.   Chronic systolic heart failure:  EF 20-25%.  Was dehydrated on arrival. So diuretics held. No evidence of volume overload today at Dc.  -DC home -Hold diuretics (and Kcl) due to AKI after discharge until be seen by Dr. Violeta Gelinas next week. (He is HF clinic's patient. I contacted HF clinic. Scheduled for f/u appointment next Tursday 9 AM. I informed patient about that. We appreciate Dr. Violeta Gelinas reevaluation for resuming/adjusting diuretics as needed. I notified Mr. Kellar that he can resume Torsemide if developes SOB  mean while. He verbalized understanding)  HTN: ON HCTZ 25 mg QD at home -Continue home meds: hydralazine 25mg  TID after Dc  HLD -c/w home meds: rosuvastatin 10mg  daily  BPH -c/w home meds: finasteride 5mg  daily  Anxiety -c/w home meds: sertraline 25mg  daily    Dispo: DC Home today  [name], MD 03/15/2019, 5:50 AM Pager: [phone]

## 2019-03-15 NOTE — Plan of Care (Signed)
  Problem: Education: Goal: Knowledge of General Education information will improve Description Including pain rating scale, medication(s)/side effects and non-pharmacologic comfort measures Outcome: Progressing   Problem: Pain Managment: Goal: General experience of comfort will improve Outcome: Progressing   Problem: Safety: Goal: Ability to remain free from injury will improve Outcome: Progressing   Problem: Education: Goal: Knowledge of disease and its progression will improve Outcome: Progressing   Problem: Urinary Elimination: Goal: Progression of disease will be identified and treated Outcome: Progressing   Problem: Fluid Volume: Goal: Fluid volume balance will be maintained or improved Outcome: Progressing

## 2019-03-15 NOTE — TOC Transition Note (Signed)
Transition of Care [facility]) - CM/SW Discharge Note   Patient Details  Name: Ryan Macdonald MRN: [id number] Date of Birth: 02/07/1934  Transition of Care [facility]) CM/SW Contact:  [name] Phone Number: [phone] 03/15/2019, 1:31 PM   Clinical Narrative:    Patient is for dc home today with spouse; follow up with the [facility].   Final next level of care: Home/Self Care Barriers to Discharge: No Barriers Identified   Patient Goals and CMS Choice Patient states their goals for this hospitalization and ongoing recovery are:: to stay at home CMS Medicare.gov Compare Post Acute Care list provided to:: Patient Choice offered to / list presented to : NA  Discharge Placement                       Discharge Plan and Services In-house Referral: NA Discharge Planning Services: CM Consult Post Acute Care Choice: NA          DME Arranged: N/A DME Agency: NA HH Arranged: NA HH Agency: NA   Social Determinants of Health (SDOH) Interventions     Readmission Risk Interventions No flowsheet data found.

## 2019-03-19 NOTE — Telephone Encounter (Signed)
Spoke w/ Thayer Ohm via email. He is still [facility].

## 2019-03-20 ENCOUNTER — Ambulatory Visit (HOSPITAL_COMMUNITY)
Admission: RE | Admit: 2019-03-20 | Discharge: 2019-03-20 | Disposition: A | Discharge status: Home / Self Care | Payer: No Typology Code available for payment source | Source: Ambulatory Visit | Attending: Internal Medicine | Admitting: Internal Medicine

## 2019-03-20 ENCOUNTER — Other Ambulatory Visit: Payer: Self-pay

## 2019-03-20 DIAGNOSIS — I5023 Acute on chronic systolic (congestive) heart failure: Secondary | ICD-10-CM | POA: Diagnosis present

## 2019-03-20 LAB — COMPREHENSIVE METABOLIC PANEL
ALT: 40 U/L (ref 0–44)
AST: 67 U/L — ABNORMAL HIGH (ref 15–41)
Albumin: 2.9 g/dL — ABNORMAL LOW (ref 3.5–5.0)
Alkaline Phosphatase: 85 U/L (ref 38–126)
Anion gap: 11 (ref 5–15)
BUN: 21 mg/dL (ref 8–23)
CO2: 21 mmol/L — ABNORMAL LOW (ref 22–32)
Calcium: 9.1 mg/dL (ref 8.9–10.3)
Chloride: 108 mmol/L (ref 98–111)
Creatinine, Ser: 1.96 mg/dL — ABNORMAL HIGH (ref 0.61–1.24)
GFR calc Af Amer: 36 mL/min — ABNORMAL LOW (ref >60)
GFR calc non Af Amer: 31 mL/min — ABNORMAL LOW (ref >60)
Glucose, Bld: 109 mg/dL — ABNORMAL HIGH (ref 70–99)
Potassium: 5.2 mmol/L — ABNORMAL HIGH (ref 3.5–5.1)
Sodium: 140 mmol/L (ref 135–145)
Total Bilirubin: 0.5 mg/dL (ref 0.3–1.2)
Total Protein: 7.6 g/dL (ref 6.5–8.1)

## 2019-03-20 LAB — CBC
HCT: 33.3 % — ABNORMAL LOW (ref 39.0–52.0)
Hemoglobin: 10.7 g/dL — ABNORMAL LOW (ref 13.0–17.0)
MCH: 30.6 pg (ref 26.0–34.0)
MCHC: 32.1 g/dL (ref 30.0–36.0)
MCV: 95.1 fL (ref 80.0–100.0)
Platelets: 258 10*3/uL (ref 150–400)
RBC: 3.5 MIL/uL — ABNORMAL LOW (ref 4.22–5.81)
RDW: 19.5 % — ABNORMAL HIGH (ref 11.5–15.5)
WBC: 6.6 10*3/uL (ref 4.0–10.5)
nRBC: 0 % (ref 0.0–0.2)

## 2019-03-20 LAB — LIPID PANEL
Cholesterol: 115 mg/dL (ref 0–200)
HDL: 35 mg/dL — ABNORMAL LOW (ref >40)
LDL Cholesterol: 58 mg/dL (ref 0–99)
Total CHOL/HDL Ratio: 3.3 RATIO
Triglycerides: 110 mg/dL (ref <150)
VLDL: 22 mg/dL (ref 0–40)

## 2019-03-20 LAB — TSH: TSH: 82.403 u[IU]/mL — ABNORMAL HIGH (ref 0.350–4.500)

## 2019-03-21 ENCOUNTER — Telehealth (HOSPITAL_COMMUNITY): Payer: Self-pay

## 2019-03-21 DIAGNOSIS — I5022 Chronic systolic (congestive) heart failure: Secondary | ICD-10-CM

## 2019-03-21 MED ORDER — LEVOTHYROXINE SODIUM 50 MCG PO TABS: 50.0000 ug | ORAL_TABLET | Freq: Every day | ORAL | 5 refills | Status: DC

## 2019-03-21 MED ORDER — TORSEMIDE 20 MG PO TABS: 90.0000 mg | ORAL_TABLET | Freq: Every day | ORAL | 3 refills | Status: DC

## 2019-03-21 MED ORDER — TORSEMIDE 20 MG PO TABS: 20.0000 mg | ORAL_TABLET | Freq: Every day | ORAL | 6 refills | Status: DC

## 2019-03-21 MED ORDER — LEVOTHYROXINE SODIUM 50 MCG PO TABS: 50.0000 ug | ORAL_TABLET | Freq: Every day | ORAL | 3 refills | Status: DC

## 2019-03-21 NOTE — Telephone Encounter (Signed)
Pt has been transferred into our clinic. Message sent to scheduler to schedule pt an appt to establish w/ EP.

## 2019-03-21 NOTE — Telephone Encounter (Signed)
Pt advised to restart Cleda Daub at original dose per Dr. Shirlee Latch. LM on VM of same with Florentina Addison of paramedicine

## 2019-03-21 NOTE — Telephone Encounter (Signed)
-----   Message from Laurey Morale, MD sent at 03/20/2019  9:36 PM EDT ----- Stop any KCl supplement and follow low K diet.  He can start his torsemide back at 20 mg daily. TSH is extremely high, suggests hypothyroidism related to amiodarone use.  He should start Levothyroxine 50 mcg daily and will need TSH in 1 month.  Please forward TSH to PCP as PCP will need to follow this going forwards.

## 2019-03-21 NOTE — Telephone Encounter (Signed)
Pt aware of lab results. Pt aware of medication changes and additions. Katie from paramedicine aware as well. Results faxed to pcp at [phone].  Lab appt made for 1 month for repeat TSH.   Appt reminder card mailed to patient. Pt verbalized understanding and he wrote everything down. Rx faxed to [facility] pharmacy

## 2019-03-22 ENCOUNTER — Encounter (HOSPITAL_COMMUNITY): Payer: Self-pay

## 2019-03-22 ENCOUNTER — Ambulatory Visit (HOSPITAL_COMMUNITY)
Admit: 2019-03-22 | Discharge: 2019-03-22 | Disposition: A | Discharge status: Home / Self Care | Payer: Self-pay | Attending: Adult Health | Admitting: Adult Health

## 2019-03-22 ENCOUNTER — Ambulatory Visit (INDEPENDENT_AMBULATORY_CARE_PROVIDER_SITE_OTHER): Payer: Self-pay | Admitting: *Deleted

## 2019-03-22 ENCOUNTER — Other Ambulatory Visit: Payer: Self-pay

## 2019-03-22 ENCOUNTER — Other Ambulatory Visit (HOSPITAL_COMMUNITY): Payer: Self-pay

## 2019-03-22 ENCOUNTER — Telehealth (HOSPITAL_COMMUNITY): Payer: Self-pay

## 2019-03-22 VITALS — BP 130/70 | HR 64 | Wt 205.0 lb

## 2019-03-22 DIAGNOSIS — I480 Paroxysmal atrial fibrillation: Secondary | ICD-10-CM

## 2019-03-22 DIAGNOSIS — N183 Chronic kidney disease, stage 3 unspecified: Secondary | ICD-10-CM

## 2019-03-22 DIAGNOSIS — I5043 Acute on chronic combined systolic (congestive) and diastolic (congestive) heart failure: Secondary | ICD-10-CM

## 2019-03-22 DIAGNOSIS — I2510 Atherosclerotic heart disease of native coronary artery without angina pectoris: Secondary | ICD-10-CM

## 2019-03-22 DIAGNOSIS — Z955 Presence of coronary angioplasty implant and graft: Secondary | ICD-10-CM

## 2019-03-22 DIAGNOSIS — Z95810 Presence of automatic (implantable) cardiac defibrillator: Secondary | ICD-10-CM

## 2019-03-22 DIAGNOSIS — I5022 Chronic systolic (congestive) heart failure: Secondary | ICD-10-CM

## 2019-03-22 DIAGNOSIS — I428 Other cardiomyopathies: Secondary | ICD-10-CM

## 2019-03-22 DIAGNOSIS — E032 Hypothyroidism due to medicaments and other exogenous substances: Secondary | ICD-10-CM

## 2019-03-22 DIAGNOSIS — E039 Hypothyroidism, unspecified: Secondary | ICD-10-CM

## 2019-03-22 MED ORDER — LEVOTHYROXINE SODIUM 50 MCG PO TABS: 50.0000 ug | ORAL_TABLET | Freq: Every day | ORAL | 0 refills | Status: DC

## 2019-03-22 NOTE — Patient Instructions (Addendum)
Stop amiodarone, we have sent in 7 tablets of your Synthroid to [facility].     1 week for repeat lab work on 30 April @11 :30 am, gate code 8007  4 weeks for telephone visit with Amy, NP on 20 May @ 10:30, gate code 8006  Please call office at [phone] for any questions or concerns.

## 2019-03-22 NOTE — Addendum Note (Signed)
Encounter addended by: Sherald Hess, NP on: 03/22/2019 10:37 AM  Actions taken: Clinical Note Signed

## 2019-03-22 NOTE — Addendum Note (Signed)
Encounter addended by: Shea Stakes, RN on: 03/22/2019 10:53 AM  Actions taken: Pharmacy for encounter modified, Visit diagnoses modified, Order list changed, Diagnosis association updated, Clinical Note Signed

## 2019-03-22 NOTE — Telephone Encounter (Signed)
Attempted to call to review AVS, VM full, will mail.  Stop amiodarone, we have sent in 7 tablets of your Synthroid to [facility].     1 week for repeat lab work on 30 April @11 :30 am, gate code 8007  4 weeks for telephone visit with Amy, NP on 20 May @ 10:30, gate code 8006  Please call office at [phone] for any questions or concerns.

## 2019-03-22 NOTE — Progress Notes (Addendum)
Heart Failure TeleHealth Note  Due to national recommendations of social distancing due to COVID 19, Audio/video telehealth visit is felt to be most appropriate for this patient at this time.  See MyChart message from today for patient consent regarding telehealth for [facility].  Date:  03/22/2019   ID:  Ryan Macdonald, DOB 01/31/1934, MRN [id number]  Location: Home  Provider location: [facility] Type of Visit: Established patient   PCP:  [facility]  Cardiologist:  [name], MD Primary HF: Dr Shirlee Latch   Chief Complaint:  Heart Failure    History of Present Illness: Ryan Macdonald is a 85 y.o. male with a history of systolic HF due toNICM, Biotronic ICD, A Fibon Eliquis, DMII, HTN,CAD s/p pLAD stent in 07/2018 with residual non-obstructive disease,chronic hypoxic respiratory failure on home O2 PRN,and depression.  Admitted 12/26 - 11/28/18 with A/C systolic CHF. Diuresed 21 lbs and meds adjusted as tolerated. Echo showed EF 20-25%. EP was consulted for RV pacing and ICD settings were adjusted. TSH at that time was 18.5.   Admitted 03/14/19 with lower pack pain. This was thought to be from muscle spasm. He also had AKI so diuretics were held. He was instructed to restart spiro and torsemide on 4//24/20. He was take on bb and digoxin due to bradycardia.   On 03/20/2019 he had lab work and his TSH was 82. He was instructed to start levothyroxine 50 mcg. He was also instructed to stop potassium for K 5.2.   He presents via Programmer, applications for a telehealth visit today.   Overall feeling fine. Mild SOB with exertion. Denies PND/Orthopnea. Appetite ok. No fever or chills. Weight at home  200-205 pounds. Starting to have lower extremity edema. Taking all medications.  he denies symptoms worrisome for COVID 19.   Past Medical History:  Diagnosis Date  . AKI (acute kidney injury) (HCC) 02/2019  . Atrial fibrillation (HCC)   .  Cardiomyopathy (HCC)   . Cat scratch fever    removed mass on right side neck  . Cataracts, bilateral   . CHF (congestive heart failure) (HCC)   . Chronic kidney disease   . Corns and callosities   . Diabetes mellitus without complication (HCC)    TYPE 2  . Dyspnea   . Erythema intertrigo   . Flat foot   . Gout   . High cholesterol   . Hx of urethral stricture   . Hypertension   . Kidney stones   . Morbid obesity (HCC)   . Nonischemic cardiomyopathy (HCC)    a. ? 2009 Cath in [place] - nl cors per pt;  b. 09/2013 Echo: EF 25-30%, sev diff HK.  . Obesity   . Osteoarthritis   . PAF (paroxysmal atrial fibrillation) (HCC)    a. post-op hip in 2014 - prev on xarelto.  . Renal insufficiency    Past Surgical History:  Procedure Laterality Date  . CARDIAC CATHETERIZATION  1980's  . CIRCUMCISION  1940's  . CORONARY STENT INTERVENTION N/A 08/18/2018   Procedure: CORONARY STENT INTERVENTION;  Surgeon: Corky Crafts, MD;  Location: [facility] INVASIVE CV LAB;  Service: Cardiovascular;  Laterality: N/A;  . CYSTOSCOPY WITH URETHRAL DILATATION  10/23/2013  . CYSTOSCOPY WITH URETHRAL DILATATION N/A 10/23/2013   Procedure: CYSTOSCOPY WITH URETHRAL DILATATION;  Surgeon: Kathi Ludwig, MD;  Location: [facility] OR;  Service: Urology;  Laterality: N/A;  . INCISION AND DRAINAGE OF WOUND Right 1980's   "cat  scratch" (10/23/2013)  . INGUINAL HERNIA REPAIR Right 1950's  . INTRAVASCULAR ULTRASOUND/IVUS N/A 08/18/2018   Procedure: Intravascular Ultrasound/IVUS;  Surgeon: Corky CraftsVaranasi, Jayadeep S, MD;  [facility];  Service: Cardiovascular;  Laterality: N/A;  . KIDNEY STONE SURGERY Right 1970's; 1983   "twice"  . LEFT HEART CATH AND CORONARY ANGIOGRAPHY N/A 08/18/2018   Procedure: LEFT HEART CATH AND CORONARY ANGIOGRAPHY;  Surgeon: Corky CraftsVaranasi, Jayadeep S, MD;  [facility];  Service: Cardiovascular;  Laterality: N/A;  . TONSILLECTOMY AND ADENOIDECTOMY  1940's  . TOTAL HIP  ARTHROPLASTY Left 10/23/2013   Procedure: TOTAL HIP ARTHROPLASTY;  Surgeon: Valeria BatmanPeter W Whitfield, MD;  [facility];  Service: Orthopedics;  Laterality: Left;     Current Outpatient Medications  Medication Sig Dispense Refill  . allopurinol (ZYLOPRIM) 100 MG tablet Take 200 mg by mouth daily as needed (for gout symptoms).     Marland Kitchen. amiodarone (PACERONE) 200 MG tablet Take 1 tablet (200 mg total) by mouth daily. 30 tablet 3  . apixaban (ELIQUIS) 2.5 MG TABS tablet Take 1 tablet (2.5 mg total) by mouth 2 (two) times daily. (Patient taking differently: Take 2.5 mg by mouth 2 (two) times daily. Caution - blood thinner) 90 tablet 0  . cyanocobalamin 1000 MCG tablet Take 1 tablet (1,000 mcg total) by mouth daily. 90 tablet 0  . diclofenac sodium (VOLTAREN) 1 % GEL Apply 4 g topically 4 (four) times daily. 1 Tube 0  . docusate sodium (COLACE) 100 MG capsule Take 100 mg by mouth 2 (two) times daily.    . finasteride (PROSCAR) 5 MG tablet Take 1 tablet (5 mg total) by mouth daily. 30 tablet 0  . fluticasone furoate-vilanterol (BREO ELLIPTA) 100-25 MCG/INH AEPB Inhale 1 puff into the lungs daily. 60 each 3  . glipiZIDE (GLUCOTROL) 5 MG tablet Take 5 mg by mouth daily before breakfast.    . hydrALAZINE (APRESOLINE) 25 MG tablet Take 1 tablet (25 mg total) by mouth 3 (three) times daily. 270 tablet 3  . HYDROcodone-acetaminophen (NORCO/VICODIN) 5-325 MG tablet Take 2 tablets by mouth every 4 (four) hours as needed. (Patient taking differently: Take 2 tablets by mouth every 4 (four) hours as needed for moderate pain. ) 6 tablet 0  . ipratropium-albuterol (DUONEB) 0.5-2.5 (3) MG/3ML SOLN Take 3 mLs by nebulization every 4 (four) hours as needed. 360 mL 0  . isosorbide mononitrate (IMDUR) 30 MG 24 hr tablet Take 0.5 tablets (15 mg total) by mouth daily. 15 tablet 5  . ketoconazole (NIZORAL) 2 % shampoo Apply 1 application topically 2 (two) times a week.    . magnesium oxide (MAG-OX) 400 MG tablet Take 400 mg by  mouth daily.    . methocarbamol (ROBAXIN) 500 MG tablet Take 1 tablet (500 mg total) by mouth every 6 (six) hours as needed for muscle spasms. 30 tablet 0  . nitroGLYCERIN (NITROSTAT) 0.4 MG SL tablet Place 1 tablet (0.4 mg total) under the tongue every 5 (five) minutes x 3 doses as needed for chest pain. 25 tablet 0  . rosuvastatin (CRESTOR) 10 MG tablet Take 1 tablet (10 mg total) by mouth daily at 6 PM. 30 tablet 5  . sertraline (ZOLOFT) 25 MG tablet Take 25 mg by mouth daily.     Melene Muller. [START ON 03/23/2019] spironolactone (ALDACTONE) 25 MG tablet Take 0.5 tablets (12.5 mg total) by mouth daily. 30 tablet 6  . [START ON 03/23/2019] torsemide (DEMADEX) 20 MG tablet Take 4.5 tablets (90 mg total)  by mouth daily. (Patient taking differently: Take 20 mg by mouth daily. ) 30 tablet 3  . levothyroxine (SYNTHROID) 50 MCG tablet Take 1 tablet (50 mcg total) by mouth daily before breakfast. (Patient not taking: Reported on 03/22/2019) 90 tablet 3   No current facility-administered medications for this encounter.     Allergies:   Lisinopril and Xarelto [rivaroxaban]   Social History:  The patient  reports that he has quit smoking. His smoking use included cigarettes and cigars. He has a 15.00 pack-year smoking history. He has quit using smokeless tobacco. He reports that he does not drink alcohol or use drugs.   Family History:  The patient's family history includes Heart Problems in his maternal grandmother; Heart disease in his mother; Other in an other family member.   ROS:  Please see the history of present illness.   All other systems are personally reviewed and negative.   Exam:  Video Health Call; Exam is visual with HF Paramedic He has irregular heart rate.  General:  Speaks in full sentences. No resp difficulty. Lungs: Normal respiratory effort with conversation.  Abdomen: Non-distended per patient report Extremities: Pt denies edema. Neuro: Alert & oriented x 3.   Recent Labs: 11/23/2018: B  Natriuretic Peptide 1,382.9 03/10/2019: Magnesium 1.6 03/20/2019: ALT 40; BUN 21; Creatinine, Ser 1.96; Hemoglobin 10.7; Platelets 258; Potassium 5.2; Sodium 140; TSH 82.403  Personally reviewed   Wt Readings from Last 3 Encounters:  03/22/19 93 kg (205 lb)  03/22/19 93 kg (205 lb)  03/15/19 94.2 kg (207 lb 10.8 oz)    Vitals:   03/22/19 1002  BP: 130/70  Pulse: 64  SpO2: 98%    ASSESSMENT AND PLAN:  1. Chronic systolic Heart Failure. Due to NICM. EF has been low since at least 2014. -He has single chamber Biotronik ICD. Narrow QRS.Settings adjusted to prevent RV pacing. -Echo12/27/19: EF 20-25%, grade 3 DD, trivial AI, mod MR, LA mild to moderately dilated, RV moderately reduced, RA mildly dilated, PA peak pressure 39 mm Hg -NYHA III. Volume status trending up. Restart torsemide 20 mg daily. No potassium with elevated potassium.  - Continue spiro 12.5 mg qhs. - Continue hydral 25 mg TID + imdur 15 mg daily. Bidil not [facility] by [facility]. -Off digoxin recent hospitalization due to bradycardia.   -Hold BB with low BP and long PR interval -No ACEI or ARNI with allergy to lisinopril.Hold off on ARB with CKD. - Consider cardiomems.  2. CAD - S/p PCI 07/2018 to pLAD - No s/s of ischemia.    - Off plavix in March.  - Continue apixiban 2.5 mg BID. - Continue crestor  3.ParoxysmalA fib -Irregular.  -Stop amiodarone with TSH 82.  - Continue eliquis2.5mg  twice a day (age >37, creatinine >1.5generally).  4. RV pacing - He had anICD program change in July and sounds like he has been RV pacing since then. - Biotronic ICD reprogrammed12/27by EPto prevent RV pacing and he had no further pacing. - He wants to keep EP follow up with the [place]. Unable to interrogate Biotronic device in clinic.  - Discussed with Dr Graciela Husbands. Will stop amiodarone today.   5. CKD III - Creatinine baseline 1.8 - 2.0. Recent AKI.  Recent  with high degree of lability, but he has been taking his  diuretics wrong.  - BMET today.    6. Hypothyroidism --> amiodarone  TSH 82 4//21/20 . Starting 50 mcg levothyroxine and will stop amiodarone.  Check TSH in 4 weeks. Today I  will send 7 tablets of levothyroxine to the local pharmacy because [facility].    COVID screen The patient does not have any symptoms that suggest any further testing/ screening at this time.  Social distancing reinforced today.  Patient Risk: After full review of this patients clinical status, I feel that they are at moderate risk for cardiac decompensation at this time.  Relevant cardiac medications were reviewed at length with the patient today. The patient does not have concerns regarding their medications at this time.   The following changes were made today:  Stop amiodarone. We will send in 7 tablets of his levothyroxine to local pharmacy.  Recommended follow-up:  1 week for BMET, 4 weeks for visit.   Today, I have spent 25 minutes with the patient with telehealth technology discussing the above issues .    Waneta Martins, NP  03/22/2019 10:21 AM  [facility] [address] Heart and Vascular [place] [address] [phone] (office) [phone] (fax)

## 2019-03-22 NOTE — Progress Notes (Signed)
Paramedicine Encounter    Patient ID: Ryan Macdonald, male    DOB: [birth year], 85 y.o.   MRN: [id number]   Patient Care Team: [facility] as PCP - General (General Practice) [name], MD as PCP - Cardiology (Cardiology) Laurey Morale, MD as PCP - Advanced Heart Failure (Cardiology)  Patient Active Problem List   Diagnosis Date Noted  . AKI (acute kidney injury) (HCC) 03/14/2019  . Spasm of muscle of lower back 03/14/2019  . Acute renal failure (ARF) (HCC) 03/09/2019  . Transaminitis   . Acute on chronic renal failure (HCC) 03/08/2019  . Acute exacerbation of CHF (congestive heart failure) (HCC) 11/23/2018  . Acute on chronic heart failure (HCC) 10/19/2018  . Pulmonary emphysema (HCC)   . Chronic respiratory failure with hypoxia (HCC)   . Morbid obesity (HCC) 09/09/2018  . URI (upper respiratory infection) 08/31/2018  . UTI (urinary tract infection) 08/31/2018  . Chronic systolic (congestive) heart failure (HCC) 08/28/2018  . CHF (congestive heart failure) (HCC) 08/14/2018  . Acute on chronic combined systolic and diastolic CHF (congestive heart failure) (HCC) 07/18/2018  . First degree AV block   . Shortness of breath 04/29/2018  . Acute HFrEF (heart failure with reduced ejection fraction) (HCC) 04/29/2018  . Acute respiratory failure with hypoxia (HCC) 09/27/2017  . CHF exacerbation (HCC) 09/27/2017  . Renal insufficiency   . Acute on chronic systolic CHF (congestive heart failure) (HCC) 07/08/2017  . Left knee pain 07/08/2017  . Acute bronchitis 05/10/2017  . Acute congestive heart failure (HCC) 04/14/2017  . Elevated troponin 04/14/2017  . Lactic acidosis   . Acute on chronic systolic heart failure, NYHA class 2 (HCC) 02/03/2017  . Diabetes mellitus type 2 in obese (HCC) 02/03/2017  . CAP (community acquired pneumonia) 02/03/2017  . Lobar pneumonia (HCC)   . Diabetes mellitus with complication (HCC)   . Acute on chronic respiratory failure with  hypoxia (HCC)   . Acute on chronic combined systolic and diastolic heart failure (HCC) 05/19/2016  . Depression 05/19/2016  . Gout 05/19/2016  . 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 NSVT (nonsustained ventricular tachycardia) (HCC)   . Chronic renal insufficiency, stage 3 (moderate) (HCC)   . Hyperkalemia 08/21/2015  . Pulmonary embolism (HCC)   . Acute respiratory failure (HCC)   . Osteoarthritis of left hip 10/25/2013  . Atrial fibrillation (HCC) 10/25/2013  . S/P hip replacement 10/24/2013  . Nonischemic cardiomyopathy (HCC)   . Obesity (BMI 30-39.9)   . Essential hypertension   . Hyperlipidemia     Current Outpatient Medications:  .  allopurinol (ZYLOPRIM) 100 MG tablet, Take 200 mg by mouth daily as needed (for gout symptoms). , Disp: , Rfl:  .  amiodarone (PACERONE) 200 MG tablet, Take 1 tablet (200 mg total) by mouth daily., Disp: 30 tablet, Rfl: 3 .  apixaban (ELIQUIS) 2.5 MG TABS tablet, Take 1 tablet (2.5 mg total) by mouth 2 (two) times daily. (Patient taking differently: Take 2.5 mg by mouth 2 (two) times daily. Caution - blood thinner), Disp: 90 tablet, Rfl: 0 .  cyanocobalamin 1000 MCG tablet, Take 1 tablet (1,000 mcg total) by mouth daily., Disp: 90 tablet, Rfl: 0 .  diclofenac sodium (VOLTAREN) 1 % GEL, Apply 4 g topically 4 (four) times daily., Disp: 1 Tube, Rfl: 0 .  docusate sodium (COLACE) 100 MG capsule, Take 100 mg by mouth 2 (two) times daily., Disp: , Rfl:  .  finasteride (PROSCAR) 5 MG tablet, Take 1 tablet (  5 mg total) by mouth daily., Disp: 30 tablet, Rfl: 0 .  fluticasone furoate-vilanterol (BREO ELLIPTA) 100-25 MCG/INH AEPB, Inhale 1 puff into the lungs daily., Disp: 60 each, Rfl: 3 .  glipiZIDE (GLUCOTROL) 5 MG tablet, Take 5 mg by mouth daily before breakfast., Disp: , Rfl:  .  hydrALAZINE (APRESOLINE) 25 MG tablet, Take 1 tablet (25 mg total) by mouth 3 (three) times daily., Disp: 270 tablet, Rfl: 3 .   HYDROcodone-acetaminophen (NORCO/VICODIN) 5-325 MG tablet, Take 2 tablets by mouth every 4 (four) hours as needed. (Patient taking differently: Take 2 tablets by mouth every 4 (four) hours as needed for moderate pain. ), Disp: 6 tablet, Rfl: 0 .  ipratropium-albuterol (DUONEB) 0.5-2.5 (3) MG/3ML SOLN, Take 3 mLs by nebulization every 4 (four) hours as needed., Disp: 360 mL, Rfl: 0 .  isosorbide mononitrate (IMDUR) 30 MG 24 hr tablet, Take 0.5 tablets (15 mg total) by mouth daily., Disp: 15 tablet, Rfl: 5 .  ketoconazole (NIZORAL) 2 % shampoo, Apply 1 application topically 2 (two) times a week., Disp: , Rfl:  .  levothyroxine (SYNTHROID) 50 MCG tablet, Take 1 tablet (50 mcg total) by mouth daily before breakfast. (Patient not taking: Reported on 03/22/2019), Disp: 90 tablet, Rfl: 3 .  magnesium oxide (MAG-OX) 400 MG tablet, Take 400 mg by mouth daily., Disp: , Rfl:  .  methocarbamol (ROBAXIN) 500 MG tablet, Take 1 tablet (500 mg total) by mouth every 6 (six) hours as needed for muscle spasms., Disp: 30 tablet, Rfl: 0 .  nitroGLYCERIN (NITROSTAT) 0.4 MG SL tablet, Place 1 tablet (0.4 mg total) under the tongue every 5 (five) minutes x 3 doses as needed for chest pain., Disp: 25 tablet, Rfl: 0 .  rosuvastatin (CRESTOR) 10 MG tablet, Take 1 tablet (10 mg total) by mouth daily at 6 PM., Disp: 30 tablet, Rfl: 5 .  sertraline (ZOLOFT) 25 MG tablet, Take 25 mg by mouth daily. , Disp: , Rfl:  .  [START ON 03/23/2019] spironolactone (ALDACTONE) 25 MG tablet, Take 0.5 tablets (12.5 mg total) by mouth daily., Disp: 30 tablet, Rfl: 6 .  [START ON 03/23/2019] torsemide (DEMADEX) 20 MG tablet, Take 4.5 tablets (90 mg total) by mouth daily., Disp: 30 tablet, Rfl: 3 Allergies  Allergen Reactions  . Lisinopril Swelling    Facial swelling  . Xarelto [Rivaroxaban] Other (See Comments)    "Blood came out of my penis"      Social History   Socioeconomic History  . Marital status: Married    Spouse name: Not on file   . Number of children: Not on file  . Years of education: Not on file  . Highest education level: Not on file  Occupational History  . Occupation: Retired    Comment: Former Patent examiner  . Financial resource strain: Not on file  . Food insecurity:    Worry: Not on file    Inability: Not on file  . Transportation needs:    Medical: Not on file    Non-medical: Not on file  Tobacco Use  . Smoking status: Former Smoker    Packs/day: 1.00    Years: 15.00    Pack years: 15.00    Types: Cigarettes, Cigars  . Smokeless tobacco: Former Neurosurgeon  . Tobacco comment: quit smoking ~ 50 yr ago  Substance and Sexual Activity  . Alcohol use: No    Alcohol/week: 2.0 standard drinks    Types: 2 Cans of beer per week  Comment: rare beer.  . Drug use: No  . Sexual activity: Yes  Lifestyle  . Physical activity:    Days per week: Not on file    Minutes per session: Not on file  . Stress: Not on file  Relationships  . Social connections:    Talks on phone: Not on file    Gets together: Not on file    Attends religious service: Not on file    Active member of club or organization: Not on file    Attends meetings of clubs or organizations: Not on file    Relationship status: Not on file  . Intimate partner violence:    Fear of current or ex partner: No    Emotionally abused: No    Physically abused: No    Forced sexual activity: No  Other Topics Concern  . Not on file  Social History Narrative   Lives in [place] with wife. Moved from [place] (2013). Does not routinely exercise.    Physical Exam      Future Appointments  Date Time Provider Department Center  04/19/2019 11:00 AM [facility] None  04/25/2019  3:45 PM Felecia Shelling, DPM [facility]  05/28/2019  2:20 PM Laurey Morale, MD [facility] None    BP 130/70   Pulse 72   Temp 97.7 F (36.5 C)   Resp 15   Wt 205 lb (93 kg)   SpO2 98%   BMI 32.11 kg/m   Weight yesterday-200 Last visit  weight-198  Pt reports he is feeling 900% better. He was able to walk in yesterday to get labs without difficulty.  He has not taken his torsemide, spiro since he has been home from hosp per d/c orders. Per lab yesterday, he can restart the torsemide 20mg  and spiro 0.5 tab but d/c the potassium.  His weight is up 5lbs, he has +3 pitting edema to his legs.  He denies increased sob, no dizziness. Lungs clear. He had not taken his meds yet this morning.  Stop the amio--HR below 70.  Will need BMET next week.  The torsemide and spiro was added back into his pill box. Will see him next week.    [name], EMT-Paramedic [phone] [facility] Paramedic  03/22/19

## 2019-03-23 ENCOUNTER — Telehealth (HOSPITAL_COMMUNITY): Payer: Self-pay | Admitting: Licensed Clinical Social Worker

## 2019-03-23 LAB — CUP PACEART REMOTE DEVICE CHECK
Date Time Interrogation Session: 20200424123435
Implantable Lead Location: 753860
Implantable Lead Model: 365501
Implantable Lead Serial Number: 10600411
Pulse Gen Model: 399436
Pulse Gen Serial Number: 60886010

## 2019-03-23 NOTE — Telephone Encounter (Signed)
CSW reached out to pt to check in regarding food and medication status at this time. Pt reports that he has everything he needs and greatly appreciates everything [facility] has done for him. Pt has support and company from his wife and daughter and they are all taking safety precautions.  CSW encouraged pt to reach out with any concerns and will continue to follow and assist as needed  Burna Sis, LCSW Clinical Social Worker [facility] [phone]

## 2019-03-27 MED ORDER — LEVOTHYROXINE SODIUM 50 MCG PO TABS: 50.0000 ug | ORAL_TABLET | Freq: Every day | ORAL | 6 refills | Status: DC

## 2019-03-27 MED ORDER — LEVOTHYROXINE SODIUM 50 MCG PO TABS: 50.0000 ug | ORAL_TABLET | Freq: Every day | ORAL | 0 refills | Status: DC

## 2019-03-27 NOTE — Addendum Note (Signed)
Addended by: Noralee Space on: 03/27/2019 10:25 AM   Modules accepted: Orders

## 2019-03-27 NOTE — Telephone Encounter (Signed)
Per Florentina Addison w/paramedicine pt has never received Levothyroxine.  7 day supply sent to [facility] and 30 day supply with refills sent to [facility]

## 2019-03-28 ENCOUNTER — Other Ambulatory Visit (HOSPITAL_COMMUNITY): Payer: Self-pay

## 2019-03-28 NOTE — Progress Notes (Signed)
Paramedicine Encounter    Patient ID: Ryan Macdonald, male    DOB: 03/11/1934, 85 y.o.   MRN: [id number]   Patient Care Team: [facility], [facility] as PCP - General (General Practice) [name], MD as PCP - Cardiology (Cardiology) Laurey Morale, MD as PCP - [facility] (Cardiology)  Patient Active Problem List   Diagnosis Date Noted  . AKI (acute kidney injury) (HCC) 03/14/2019  . Spasm of muscle of lower back 03/14/2019  . Acute renal failure (ARF) (HCC) 03/09/2019  . Transaminitis   . Acute on chronic renal failure (HCC) 03/08/2019  . Acute exacerbation of CHF (congestive heart failure) (HCC) 11/23/2018  . Acute on chronic heart failure (HCC) 10/19/2018  . Pulmonary emphysema (HCC)   . Chronic respiratory failure with hypoxia (HCC)   . Morbid obesity (HCC) 09/09/2018  . URI (upper respiratory infection) 08/31/2018  . UTI (urinary tract infection) 08/31/2018  . Chronic systolic (congestive) heart failure (HCC) 08/28/2018  . CHF (congestive heart failure) (HCC) 08/14/2018  . Acute on chronic combined systolic and diastolic CHF (congestive heart failure) (HCC) 07/18/2018  . First degree AV block   . Shortness of breath 04/29/2018  . Acute HFrEF (heart failure with reduced ejection fraction) (HCC) 04/29/2018  . Acute respiratory failure with hypoxia (HCC) 09/27/2017  . CHF exacerbation (HCC) 09/27/2017  . Renal insufficiency   . Acute on chronic systolic CHF (congestive heart failure) (HCC) 07/08/2017  . Left knee pain 07/08/2017  . Acute bronchitis 05/10/2017  . Acute congestive heart failure (HCC) 04/14/2017  . Elevated troponin 04/14/2017  . Lactic acidosis   . Acute on chronic systolic heart failure, NYHA class 2 (HCC) 02/03/2017  . Diabetes mellitus type 2 in obese (HCC) 02/03/2017  . CAP (community acquired pneumonia) 02/03/2017  . Lobar pneumonia (HCC)   . Diabetes mellitus with complication (HCC)   . Acute on chronic respiratory failure with  hypoxia (HCC)   . Acute on chronic combined systolic and diastolic heart failure (HCC) 05/19/2016  . Depression 05/19/2016  . Gout 05/19/2016  . 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 NSVT (nonsustained ventricular tachycardia) (HCC)   . Chronic renal insufficiency, stage 3 (moderate) (HCC)   . Hyperkalemia 08/21/2015  . Pulmonary embolism (HCC)   . Acute respiratory failure (HCC)   . Osteoarthritis of left hip 10/25/2013  . Atrial fibrillation (HCC) 10/25/2013  . S/P hip replacement 10/24/2013  . Nonischemic cardiomyopathy (HCC)   . Obesity (BMI 30-39.9)   . Essential hypertension   . Hyperlipidemia     Current Outpatient Medications:  .  allopurinol (ZYLOPRIM) 100 MG tablet, Take 200 mg by mouth daily as needed (for gout symptoms). , Disp: , Rfl:  .  apixaban (ELIQUIS) 2.5 MG TABS tablet, Take 1 tablet (2.5 mg total) by mouth 2 (two) times daily. (Patient taking differently: Take 2.5 mg by mouth 2 (two) times daily. Caution - blood thinner), Disp: 90 tablet, Rfl: 0 .  cyanocobalamin 1000 MCG tablet, Take 1 tablet (1,000 mcg total) by mouth daily., Disp: 90 tablet, Rfl: 0 .  diclofenac sodium (VOLTAREN) 1 % GEL, Apply 4 g topically 4 (four) times daily., Disp: 1 Tube, Rfl: 0 .  docusate sodium (COLACE) 100 MG capsule, Take 100 mg by mouth 2 (two) times daily., Disp: , Rfl:  .  finasteride (PROSCAR) 5 MG tablet, Take 1 tablet (5 mg total) by mouth daily., Disp: 30 tablet, Rfl: 0 .  fluticasone furoate-vilanterol (BREO ELLIPTA) 100-25 MCG/INH AEPB, Inhale  1 puff into the lungs daily., Disp: 60 each, Rfl: 3 .  glipiZIDE (GLUCOTROL) 5 MG tablet, Take 5 mg by mouth daily before breakfast., Disp: , Rfl:  .  hydrALAZINE (APRESOLINE) 25 MG tablet, Take 1 tablet (25 mg total) by mouth 3 (three) times daily., Disp: 270 tablet, Rfl: 3 .  HYDROcodone-acetaminophen (NORCO/VICODIN) 5-325 MG tablet, Take 2 tablets by mouth every 4 (four) hours as needed. (Patient taking  differently: Take 2 tablets by mouth every 4 (four) hours as needed for moderate pain. ), Disp: 6 tablet, Rfl: 0 .  ipratropium-albuterol (DUONEB) 0.5-2.5 (3) MG/3ML SOLN, Take 3 mLs by nebulization every 4 (four) hours as needed., Disp: 360 mL, Rfl: 0 .  isosorbide mononitrate (IMDUR) 30 MG 24 hr tablet, Take 0.5 tablets (15 mg total) by mouth daily., Disp: 15 tablet, Rfl: 5 .  ketoconazole (NIZORAL) 2 % shampoo, Apply 1 application topically 2 (two) times a week., Disp: , Rfl:  .  levothyroxine (SYNTHROID) 50 MCG tablet, Take 1 tablet (50 mcg total) by mouth daily before breakfast., Disp: 30 tablet, Rfl: 6 .  magnesium oxide (MAG-OX) 400 MG tablet, Take 400 mg by mouth daily., Disp: , Rfl:  .  methocarbamol (ROBAXIN) 500 MG tablet, Take 1 tablet (500 mg total) by mouth every 6 (six) hours as needed for muscle spasms., Disp: 30 tablet, Rfl: 0 .  rosuvastatin (CRESTOR) 10 MG tablet, Take 1 tablet (10 mg total) by mouth daily at 6 PM., Disp: 30 tablet, Rfl: 5 .  sertraline (ZOLOFT) 25 MG tablet, Take 25 mg by mouth daily. , Disp: , Rfl:  .  spironolactone (ALDACTONE) 25 MG tablet, Take 0.5 tablets (12.5 mg total) by mouth daily. (Patient taking differently: Take 12.5 mg by mouth at bedtime. ), Disp: 30 tablet, Rfl: 6 .  torsemide (DEMADEX) 20 MG tablet, Take 4.5 tablets (90 mg total) by mouth daily. (Patient taking differently: Take 20 mg by mouth daily. Per his d/c papers), Disp: 30 tablet, Rfl: 3 .  amiodarone (PACERONE) 200 MG tablet, Take 1 tablet (200 mg total) by mouth daily. (Patient not taking: Reported on 03/28/2019), Disp: 30 tablet, Rfl: 3 .  nitroGLYCERIN (NITROSTAT) 0.4 MG SL tablet, Place 1 tablet (0.4 mg total) under the tongue every 5 (five) minutes x 3 doses as needed for chest pain. (Patient not taking: Reported on 03/28/2019), Disp: 25 tablet, Rfl: 0 Allergies  Allergen Reactions  . Lisinopril Swelling    Facial swelling  . Xarelto [Rivaroxaban] Other (See Comments)    "Blood came  out of my penis"      Social History   Socioeconomic History  . Marital status: Married    Spouse name: Not on file  . Number of children: Not on file  . Years of education: Not on file  . Highest education level: Not on file  Occupational History  . Occupation: Retired    Comment: Former Patent examiner  . Financial resource strain: Not on file  . Food insecurity:    Worry: Not on file    Inability: Not on file  . Transportation needs:    Medical: Not on file    Non-medical: Not on file  Tobacco Use  . Smoking status: Former Smoker    Packs/day: 1.00    Years: 15.00    Pack years: 15.00    Types: Cigarettes, Cigars  . Smokeless tobacco: Former Neurosurgeon  . Tobacco comment: quit smoking ~ 50 yr ago  Substance and Sexual Activity  .  Alcohol use: No    Alcohol/week: 2.0 standard drinks    Types: 2 Cans of beer per week    Comment: rare beer.  . Drug use: No  . Sexual activity: Yes  Lifestyle  . Physical activity:    Days per week: Not on file    Minutes per session: Not on file  . Stress: Not on file  Relationships  . Social connections:    Talks on phone: Not on file    Gets together: Not on file    Attends religious service: Not on file    Active member of club or organization: Not on file    Attends meetings of clubs or organizations: Not on file    Relationship status: Not on file  . Intimate partner violence:    Fear of current or ex partner: No    Emotionally abused: No    Physically abused: No    Forced sexual activity: No  Other Topics Concern  . Not on file  Social History Narrative   Lives in [place] with wife. Moved from [place] (2013). Does not routinely exercise.    Physical Exam      Future Appointments  Date Time Provider Department Center  03/29/2019 10:30 AM [facility] LAB [facility] None  04/18/2019 10:30 AM [facility] PA/NP [facility] None  04/19/2019 11:00 AM [facility] LAB [facility] None  04/25/2019  3:45 PM Felecia Shelling, DPM [facility]  05/02/2019 10:15 AM Duke Salvia, MD [facility]  05/28/2019  2:20 PM Laurey Morale, MD [facility] None    BP 116/68   Pulse 68   Temp 98.3 F (36.8 C)   Resp 15   Wt 200 lb (90.7 kg)   SpO2 98%   BMI 31.32 kg/m   Weight yesterday-200 Last visit weight-205  Pt reports he is feeling well. He denies increased sob, his weight is back down. Pt denies dizziness, no c/p.  No edema noted. Pt's pill box filled. Verified with Amy that his metoprolol was stopped, along with his dig and amio and also his torsemide is  daily not the 4.5 tabs like in epic.  He started taking the levo yesterday.  Will f/u next week.   [name], EMT-Paramedic [phone] Paramedic  03/28/19

## 2019-03-29 ENCOUNTER — Other Ambulatory Visit (HOSPITAL_COMMUNITY): Payer: Self-pay

## 2019-03-30 ENCOUNTER — Encounter: Payer: Self-pay | Admitting: Cardiology

## 2019-03-30 NOTE — Progress Notes (Signed)
Remote ICD transmission.   

## 2019-04-02 ENCOUNTER — Other Ambulatory Visit: Payer: Self-pay

## 2019-04-02 ENCOUNTER — Ambulatory Visit (HOSPITAL_COMMUNITY)
Admission: RE | Admit: 2019-04-02 | Discharge: 2019-04-02 | Disposition: A | Discharge status: Home / Self Care | Payer: No Typology Code available for payment source | Source: Ambulatory Visit | Attending: Internal Medicine | Admitting: Internal Medicine

## 2019-04-02 DIAGNOSIS — I5043 Acute on chronic combined systolic (congestive) and diastolic (congestive) heart failure: Secondary | ICD-10-CM | POA: Insufficient documentation

## 2019-04-02 LAB — BASIC METABOLIC PANEL
Anion gap: 10 (ref 5–15)
BUN: 25 mg/dL — ABNORMAL HIGH (ref 8–23)
CO2: 22 mmol/L (ref 22–32)
Calcium: 8.8 mg/dL — ABNORMAL LOW (ref 8.9–10.3)
Chloride: 108 mmol/L (ref 98–111)
Creatinine, Ser: 1.75 mg/dL — ABNORMAL HIGH (ref 0.61–1.24)
GFR calc Af Amer: 41 mL/min — ABNORMAL LOW (ref >60)
GFR calc non Af Amer: 35 mL/min — ABNORMAL LOW (ref >60)
Glucose, Bld: 81 mg/dL (ref 70–99)
Potassium: 3.6 mmol/L (ref 3.5–5.1)
Sodium: 140 mmol/L (ref 135–145)

## 2019-04-05 ENCOUNTER — Other Ambulatory Visit (HOSPITAL_COMMUNITY): Payer: Self-pay

## 2019-04-05 ENCOUNTER — Telehealth (HOSPITAL_COMMUNITY): Payer: Self-pay | Admitting: Adult Health

## 2019-04-05 MED ORDER — TORSEMIDE 20 MG PO TABS: 40.0000 mg | ORAL_TABLET | Freq: Every day | ORAL | 3 refills | Status: DC

## 2019-04-05 MED ORDER — SPIRONOLACTONE 25 MG PO TABS: 25.0000 mg | ORAL_TABLET | Freq: Every day | ORAL | 6 refills | Status: DC

## 2019-04-05 NOTE — Telephone Encounter (Signed)
   Increased edema reported in legs and 3 pound weight gain. 2+ edema R and LLE .   BP 122 /62   I reviewed BMET 04/02/19 Creatinine 1.75.   Plan to increase torsemide to 40 mg daily and increase spironolactone to 25 mg daily.      Check BMET next week.   Kenita Bines  NP-C  10:06 AM '

## 2019-04-05 NOTE — Progress Notes (Signed)
Paramedicine Encounter    Patient ID: Ryan Macdonald, male    DOB: February 20, 1934, 85 y.o.   MRN: [id number]   Patient Care Team: [facility], [facility] as [facility] - General (General Practice) [name], MD as PCP - Cardiology (Cardiology) Laurey Morale, MD as PCP - Advanced Heart Failure (Cardiology)  Patient Active Problem List   Diagnosis Date Noted  . AKI (acute kidney injury) (HCC) 03/14/2019  . Spasm of muscle of lower back 03/14/2019  . Acute renal failure (ARF) (HCC) 03/09/2019  . Transaminitis   . Acute on chronic renal failure (HCC) 03/08/2019  . Acute exacerbation of CHF (congestive heart failure) (HCC) 11/23/2018  . Acute on chronic heart failure (HCC) 10/19/2018  . Pulmonary emphysema (HCC)   . Chronic respiratory failure with hypoxia (HCC)   . Morbid obesity (HCC) 09/09/2018  . URI (upper respiratory infection) 08/31/2018  . UTI (urinary tract infection) 08/31/2018  . Chronic systolic (congestive) heart failure (HCC) 08/28/2018  . CHF (congestive heart failure) (HCC) 08/14/2018  . Acute on chronic combined systolic and diastolic CHF (congestive heart failure) (HCC) 07/18/2018  . First degree AV block   . Shortness of breath 04/29/2018  . Acute HFrEF (heart failure with reduced ejection fraction) (HCC) 04/29/2018  . Acute respiratory failure with hypoxia (HCC) 09/27/2017  . CHF exacerbation (HCC) 09/27/2017  . Renal insufficiency   . Acute on chronic systolic CHF (congestive heart failure) (HCC) 07/08/2017  . Left knee pain 07/08/2017  . Acute bronchitis 05/10/2017  . Acute congestive heart failure (HCC) 04/14/2017  . Elevated troponin 04/14/2017  . Lactic acidosis   . Acute on chronic systolic heart failure, NYHA class 2 (HCC) 02/03/2017  . Diabetes mellitus type 2 in obese (HCC) 02/03/2017  . CAP (community acquired pneumonia) 02/03/2017  . Lobar pneumonia (HCC)   . Diabetes mellitus with complication (HCC)   . Acute on chronic respiratory failure with  hypoxia (HCC)   . Acute on chronic combined systolic and diastolic heart failure (HCC) 05/19/2016  . Depression 05/19/2016  . Gout 05/19/2016  . 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 NSVT (nonsustained ventricular tachycardia) (HCC)   . Chronic renal insufficiency, stage 3 (moderate) (HCC)   . Hyperkalemia 08/21/2015  . Pulmonary embolism (HCC)   . Acute respiratory failure (HCC)   . Osteoarthritis of left hip 10/25/2013  . Atrial fibrillation (HCC) 10/25/2013  . S/P hip replacement 10/24/2013  . Nonischemic cardiomyopathy (HCC)   . Obesity (BMI 30-39.9)   . Essential hypertension   . Hyperlipidemia     Current Outpatient Medications:  .  allopurinol (ZYLOPRIM) 100 MG tablet, Take 200 mg by mouth daily as needed (for gout symptoms). , Disp: , Rfl:  .  apixaban (ELIQUIS) 2.5 MG TABS tablet, Take 1 tablet (2.5 mg total) by mouth 2 (two) times daily. (Patient taking differently: Take 2.5 mg by mouth 2 (two) times daily. Caution - blood thinner), Disp: 90 tablet, Rfl: 0 .  diclofenac sodium (VOLTAREN) 1 % GEL, Apply 4 g topically 4 (four) times daily., Disp: 1 Tube, Rfl: 0 .  docusate sodium (COLACE) 100 MG capsule, Take 100 mg by mouth 2 (two) times daily., Disp: , Rfl:  .  finasteride (PROSCAR) 5 MG tablet, Take 1 tablet (5 mg total) by mouth daily., Disp: 30 tablet, Rfl: 0 .  glipiZIDE (GLUCOTROL) 5 MG tablet, Take 5 mg by mouth daily before breakfast., Disp: , Rfl:  .  hydrALAZINE (APRESOLINE) 25 MG tablet, Take 1 tablet (25  mg total) by mouth 3 (three) times daily., Disp: 270 tablet, Rfl: 3 .  HYDROcodone-acetaminophen (NORCO/VICODIN) 5-325 MG tablet, Take 2 tablets by mouth every 4 (four) hours as needed. (Patient taking differently: Take 2 tablets by mouth every 4 (four) hours as needed for moderate pain. ), Disp: 6 tablet, Rfl: 0 .  ipratropium-albuterol (DUONEB) 0.5-2.5 (3) MG/3ML SOLN, Take 3 mLs by nebulization every 4 (four) hours as needed., Disp:  360 mL, Rfl: 0 .  isosorbide mononitrate (IMDUR) 30 MG 24 hr tablet, Take 0.5 tablets (15 mg total) by mouth daily., Disp: 15 tablet, Rfl: 5 .  ketoconazole (NIZORAL) 2 % shampoo, Apply 1 application topically 2 (two) times a week., Disp: , Rfl:  .  levothyroxine (SYNTHROID) 50 MCG tablet, Take 1 tablet (50 mcg total) by mouth daily before breakfast., Disp: 30 tablet, Rfl: 6 .  magnesium oxide (MAG-OX) 400 MG tablet, Take 400 mg by mouth daily., Disp: , Rfl:  .  methocarbamol (ROBAXIN) 500 MG tablet, Take 1 tablet (500 mg total) by mouth every 6 (six) hours as needed for muscle spasms., Disp: 30 tablet, Rfl: 0 .  rosuvastatin (CRESTOR) 10 MG tablet, Take 1 tablet (10 mg total) by mouth daily at 6 PM., Disp: 30 tablet, Rfl: 5 .  sertraline (ZOLOFT) 25 MG tablet, Take 25 mg by mouth daily. , Disp: , Rfl:  .  spironolactone (ALDACTONE) 25 MG tablet, Take 0.5 tablets (12.5 mg total) by mouth daily. (Patient taking differently: Take 12.5 mg by mouth at bedtime. ), Disp: 30 tablet, Rfl: 6 .  torsemide (DEMADEX) 20 MG tablet, Take 4.5 tablets (90 mg total) by mouth daily. (Patient taking differently: Take 20 mg by mouth daily. Per his d/c papers), Disp: 30 tablet, Rfl: 3 .  amiodarone (PACERONE) 200 MG tablet, Take 1 tablet (200 mg total) by mouth daily. (Patient not taking: Reported on 03/28/2019), Disp: 30 tablet, Rfl: 3 .  cyanocobalamin 1000 MCG tablet, Take 1 tablet (1,000 mcg total) by mouth daily. (Patient not taking: Reported on 04/05/2019), Disp: 90 tablet, Rfl: 0 .  fluticasone furoate-vilanterol (BREO ELLIPTA) 100-25 MCG/INH AEPB, Inhale 1 puff into the lungs daily. (Patient not taking: Reported on 04/05/2019), Disp: 60 each, Rfl: 3 .  nitroGLYCERIN (NITROSTAT) 0.4 MG SL tablet, Place 1 tablet (0.4 mg total) under the tongue every 5 (five) minutes x 3 doses as needed for chest pain. (Patient not taking: Reported on 03/28/2019), Disp: 25 tablet, Rfl: 0 Allergies  Allergen Reactions  . Lisinopril  Swelling    Facial swelling  . Xarelto [Rivaroxaban] Other (See Comments)    "Blood came out of my penis"      Social History   Socioeconomic History  . Marital status: Married    Spouse name: Not on file  . Number of children: Not on file  . Years of education: Not on file  . Highest education level: Not on file  Occupational History  . Occupation: Retired    Comment: Former Patent examiner  . Financial resource strain: Not on file  . Food insecurity:    Worry: Not on file    Inability: Not on file  . Transportation needs:    Medical: Not on file    Non-medical: Not on file  Tobacco Use  . Smoking status: Former Smoker    Packs/day: 1.00    Years: 15.00    Pack years: 15.00    Types: Cigarettes, Cigars  . Smokeless tobacco: Former Neurosurgeon  . Tobacco comment:  quit smoking ~ 50 yr ago  Substance and Sexual Activity  . Alcohol use: No    Alcohol/week: 2.0 standard drinks    Types: 2 Cans of beer per week    Comment: rare beer.  . Drug use: No  . Sexual activity: Yes  Lifestyle  . Physical activity:    Days per week: Not on file    Minutes per session: Not on file  . Stress: Not on file  Relationships  . Social connections:    Talks on phone: Not on file    Gets together: Not on file    Attends religious service: Not on file    Active member of club or organization: Not on file    Attends meetings of clubs or organizations: Not on file    Relationship status: Not on file  . Intimate partner violence:    Fear of current or ex partner: No    Emotionally abused: No    Physically abused: No    Forced sexual activity: No  Other Topics Concern  . Not on file  Social History Narrative   [place]ives in [place] with wife. Moved from [place] (2013). Does not routinely exercise.    Physical Exam      Future Appointments  Date Time Provider Department Center  04/18/2019 10:30 AM [facility] PA/NP [facility] None  04/19/2019 11:00 AM [facility] LAB [facility] None  04/25/2019   3:45 PM Felecia ShellingEvans, Brent M, DPM [facility]  05/02/2019 10:15 AM Duke SalviaKlein, Steven C, MD [facility]  05/28/2019  2:20 PM Laurey MoraleMcLean, Dalton S, MD [facility] None  06/21/2019  7:20 AM [facility] DEVICE REMOTES [facility]    BP 122/62   Pulse 68   Temp (!) 97.2 F (36.2 C)   Resp 15   Wt 200 lb (90.7 kg)   SpO2 97%   BMI 31.32 kg/m   Weight yesterday-197 Last visit weight-200  Pt reports he is feeling ok, he denies increased sob. No h/a, no dizziness. His weight is up 3lbs from yesterday and he has +2 pitting edema to half way up his lower legs. He states he ate fried fish last night along with other things as well but he says he doesn't use salt. I did advise him that fried fish was sodium. I contacted amy and she advised to increase his spiro to a full tablet and to increase his torsemide to 40mg  daily and he will be called to sch a BMET next week. I will f/u with him next week to check his weight and v/s and then a virtual visit for the week after.   He is out his b12. --he will call to sch that. He did receive a bottle of tamsulosin however it is no longer on his med list and it looked like he was taken off of it last November. He will call his PCP to confirm whether or not he should be taking it and will let me know.  Whole tab of spiro---40mg  daily of torsemide Labs next week again  Kerry HoughKatie Carlton Buskey, EMT-Paramedic [phone] [facility] Par[facility]  04/05/19

## 2019-04-11 ENCOUNTER — Other Ambulatory Visit (HOSPITAL_COMMUNITY): Payer: Self-pay

## 2019-04-11 MED ORDER — SPIRONOLACTONE 25 MG PO TABS: 25.0000 mg | ORAL_TABLET | Freq: Every day | ORAL | 6 refills | Status: DC

## 2019-04-11 MED ORDER — LEVOTHYROXINE SODIUM 50 MCG PO TABS: 50.0000 ug | ORAL_TABLET | Freq: Every day | ORAL | 6 refills | Status: DC

## 2019-04-12 ENCOUNTER — Other Ambulatory Visit (HOSPITAL_COMMUNITY): Payer: Self-pay

## 2019-04-12 NOTE — Progress Notes (Signed)
Paramedicine Encounter    Patient ID: Ryan Macdonald, male    DOB: 05/02/1934, 85 y.o.   MRN: [id number]   Patient Care Team: [facility], [facility] as PCP - General (General Practice) [name], MD as PCP - Cardiology (Cardiology) Laurey Morale, MD as PCP - Advanced Heart Failure (Cardiology)  Patient Active Problem List   Diagnosis Date Noted  . AKI (acute kidney injury) (HCC) 03/14/2019  . Spasm of muscle of lower back 03/14/2019  . Acute renal failure (ARF) (HCC) 03/09/2019  . Transaminitis   . Acute on chronic renal failure (HCC) 03/08/2019  . Acute exacerbation of CHF (congestive heart failure) (HCC) 11/23/2018  . Acute on chronic heart failure (HCC) 10/19/2018  . Pulmonary emphysema (HCC)   . Chronic respiratory failure with hypoxia (HCC)   . Morbid obesity (HCC) 09/09/2018  . URI (upper respiratory infection) 08/31/2018  . UTI (urinary tract infection) 08/31/2018  . Chronic systolic (congestive) heart failure (HCC) 08/28/2018  . CHF (congestive heart failure) (HCC) 08/14/2018  . Acute on chronic combined systolic and diastolic CHF (congestive heart failure) (HCC) 07/18/2018  . First degree AV block   . Shortness of breath 04/29/2018  . Acute HFrEF (heart failure with reduced ejection fraction) (HCC) 04/29/2018  . Acute respiratory failure with hypoxia (HCC) 09/27/2017  . CHF exacerbation (HCC) 09/27/2017  . Renal insufficiency   . Acute on chronic systolic CHF (congestive heart failure) (HCC) 07/08/2017  . Left knee pain 07/08/2017  . Acute bronchitis 05/10/2017  . Acute congestive heart failure (HCC) 04/14/2017  . Elevated troponin 04/14/2017  . Lactic acidosis   . Acute on chronic systolic heart failure, NYHA class 2 (HCC) 02/03/2017  . Diabetes mellitus type 2 in obese (HCC) 02/03/2017  . CAP (community acquired pneumonia) 02/03/2017  . Lobar pneumonia (HCC)   . Diabetes mellitus with complication (HCC)   . Acute on chronic respiratory failure with  hypoxia (HCC)   . Acute on chronic combined systolic and diastolic heart failure (HCC) 05/19/2016  . Depression 05/19/2016  . Gout 05/19/2016  . 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 NSVT (nonsustained ventricular tachycardia) (HCC)   . Chronic renal insufficiency, stage 3 (moderate) (HCC)   . Hyperkalemia 08/21/2015  . Pulmonary embolism (HCC)   . Acute respiratory failure (HCC)   . Osteoarthritis of left hip 10/25/2013  . Atrial fibrillation (HCC) 10/25/2013  . S/P hip replacement 10/24/2013  . Nonischemic cardiomyopathy (HCC)   . Obesity (BMI 30-39.9)   . Essential hypertension   . Hyperlipidemia     Current Outpatient Medications:  .  allopurinol (ZYLOPRIM) 100 MG tablet, Take 200 mg by mouth daily as needed (for gout symptoms). , Disp: , Rfl:  .  apixaban (ELIQUIS) 2.5 MG TABS tablet, Take 1 tablet (2.5 mg total) by mouth 2 (two) times daily. (Patient taking differently: Take 2.5 mg by mouth 2 (two) times daily. Caution - blood thinner), Disp: 90 tablet, Rfl: 0 .  diclofenac sodium (VOLTAREN) 1 % GEL, Apply 4 g topically 4 (four) times daily., Disp: 1 Tube, Rfl: 0 .  docusate sodium (COLACE) 100 MG capsule, Take 100 mg by mouth 2 (two) times daily., Disp: , Rfl:  .  finasteride (PROSCAR) 5 MG tablet, Take 1 tablet (5 mg total) by mouth daily., Disp: 30 tablet, Rfl: 0 .  glipiZIDE (GLUCOTROL) 5 MG tablet, Take 5 mg by mouth daily before breakfast., Disp: , Rfl:  .  hydrALAZINE (APRESOLINE) 25 MG tablet, Take 1 tablet (25  mg total) by mouth 3 (three) times daily., Disp: 270 tablet, Rfl: 3 .  HYDROcodone-acetaminophen (NORCO/VICODIN) 5-325 MG tablet, Take 2 tablets by mouth every 4 (four) hours as needed. (Patient taking differently: Take 2 tablets by mouth every 4 (four) hours as needed for moderate pain. ), Disp: 6 tablet, Rfl: 0 .  ipratropium-albuterol (DUONEB) 0.5-2.5 (3) MG/3ML SOLN, Take 3 mLs by nebulization every 4 (four) hours as needed., Disp:  360 mL, Rfl: 0 .  isosorbide mononitrate (IMDUR) 30 MG 24 hr tablet, Take 0.5 tablets (15 mg total) by mouth daily., Disp: 15 tablet, Rfl: 5 .  ketoconazole (NIZORAL) 2 % shampoo, Apply 1 application topically 2 (two) times a week., Disp: , Rfl:  .  levothyroxine (SYNTHROID) 50 MCG tablet, Take 1 tablet (50 mcg total) by mouth daily before breakfast., Disp: 30 tablet, Rfl: 6 .  magnesium oxide (MAG-OX) 400 MG tablet, Take 400 mg by mouth daily., Disp: , Rfl:  .  methocarbamol (ROBAXIN) 500 MG tablet, Take 1 tablet (500 mg total) by mouth every 6 (six) hours as needed for muscle spasms., Disp: 30 tablet, Rfl: 0 .  rosuvastatin (CRESTOR) 10 MG tablet, Take 1 tablet (10 mg total) by mouth daily at 6 PM., Disp: 30 tablet, Rfl: 5 .  sertraline (ZOLOFT) 25 MG tablet, Take 25 mg by mouth daily. , Disp: , Rfl:  .  spironolactone (ALDACTONE) 25 MG tablet, Take 1 tablet (25 mg total) by mouth daily., Disp: 30 tablet, Rfl: 6 .  tamsulosin (FLOMAX) 0.4 MG CAPS capsule, Take 0.4 mg by mouth at bedtime., Disp: , Rfl:  .  torsemide (DEMADEX) 20 MG tablet, Take 2 tablets (40 mg total) by mouth daily. Extra 20 mg if directed by HF clinic, Disp: 75 tablet, Rfl: 3 .  amiodarone (PACERONE) 200 MG tablet, Take 1 tablet (200 mg total) by mouth daily. (Patient not taking: Reported on 03/28/2019), Disp: 30 tablet, Rfl: 3 .  cyanocobalamin 1000 MCG tablet, Take 1 tablet (1,000 mcg total) by mouth daily. (Patient not taking: Reported on 04/12/2019), Disp: 90 tablet, Rfl: 0 .  fluticasone furoate-vilanterol (BREO ELLIPTA) 100-25 MCG/INH AEPB, Inhale 1 puff into the lungs daily. (Patient not taking: Reported on 04/05/2019), Disp: 60 each, Rfl: 3 .  nitroGLYCERIN (NITROSTAT) 0.4 MG SL tablet, Place 1 tablet (0.4 mg total) under the tongue every 5 (five) minutes x 3 doses as needed for chest pain. (Patient not taking: Reported on 03/28/2019), Disp: 25 tablet, Rfl: 0 Allergies  Allergen Reactions  . Lisinopril Swelling    Facial  swelling  . Xarelto [Rivaroxaban] Other (See Comments)    "Blood came out of my penis"      Social History   Socioeconomic History  . Marital status: Married    Spouse name: Not on file  . Number of children: Not on file  . Years of education: Not on file  . Highest education level: Not on file  Occupational History  . Occupation: Retired    Comment: Former Patent examiner  . Financial resource strain: Not on file  . Food insecurity:    Worry: Not on file    Inability: Not on file  . Transportation needs:    Medical: Not on file    Non-medical: Not on file  Tobacco Use  . Smoking status: Former Smoker    Packs/day: 1.00    Years: 15.00    Pack years: 15.00    Types: Cigarettes, Cigars  . Smokeless tobacco: Former Neurosurgeon  .  Tobacco comment: quit smoking ~ 50 yr ago  Substance and Sexual Activity  . Alcohol use: No    Alcohol/week: 2.0 standard drinks    Types: 2 Cans of beer per week    Comment: rare beer.  . Drug use: No  . Sexual activity: Yes  Lifestyle  . Physical activity:    Days per week: Not on file    Minutes per session: Not on file  . Stress: Not on file  Relationships  . Social connections:    Talks on phone: Not on file    Gets together: Not on file    Attends religious service: Not on file    Active member of club or organization: Not on file    Attends meetings of clubs or organizations: Not on file    Relationship status: Not on file  . Intimate partner violence:    Fear of current or ex partner: No    Emotionally abused: No    Physically abused: No    Forced sexual activity: No  Other Topics Concern  . Not on file  Social History Narrative   [place]. [place]oved from [place] (2013[place] Does not routinely exercise.    Physical Exam      Future Appointments  Date Time Provider Department Center  04/18/2019 10:30 AM [facility] PA/NP [facility] None  04/19/2019 11:00 AM [facility] LAB [facility] None  04/25/2019  3:45 PM Felecia ShellingEvans, Brent  M, DPM [facility]  05/02/2019 10:15 AM [facility] SalviaKlein, Steven C, MD [facility]  05/28/2019  2:20 PM Laurey MoraleMcLean, Dalton S, MD [facility] None  06/21/2019  7:20 AM [facility] DEVICE REMOTES [facility]    BP 124/70   Pulse 88   Temp 97.7 F (36.5 C)   Resp 16   Wt 201 lb (91.2 kg)   SpO2 98%   BMI 31.48 kg/m    Weight yesterday-200 Last visit weight-200  Pt reports feeling great, pt denies increased sob.  He has 2 pill boxes that was filled for him, [facility] [place] [facility] [facility]. Wears compression stockings off and on. No dizziness, no h/a. Will continue to f/u.   Kerry HoughKatie Rorey Hodges, EMT-Paramedic [phone] [facility]  04/12/19

## 2019-04-13 MED ORDER — LEVOTHYROXINE SODIUM 50 MCG PO TABS: 50.0000 ug | ORAL_TABLET | Freq: Every day | ORAL | 6 refills | Status: DC

## 2019-04-13 MED ORDER — SPIRONOLACTONE 25 MG PO TABS: 25.0000 mg | ORAL_TABLET | Freq: Every day | ORAL | 6 refills | Status: DC

## 2019-04-13 NOTE — Addendum Note (Signed)
Addended by: Rob Bunting R on: 04/13/2019 10:42 AM   Modules accepted: Orders

## 2019-04-18 ENCOUNTER — Ambulatory Visit (HOSPITAL_COMMUNITY)
Admission: RE | Admit: 2019-04-18 | Discharge: 2019-04-18 | Disposition: A | Discharge status: Home / Self Care | Payer: Self-pay | Source: Ambulatory Visit | Attending: Cardiology | Admitting: Cardiology

## 2019-04-18 ENCOUNTER — Other Ambulatory Visit: Payer: Self-pay

## 2019-04-18 ENCOUNTER — Telehealth (HOSPITAL_COMMUNITY): Payer: Self-pay

## 2019-04-18 ENCOUNTER — Encounter (HOSPITAL_COMMUNITY): Payer: Self-pay

## 2019-04-18 ENCOUNTER — Other Ambulatory Visit (HOSPITAL_COMMUNITY): Payer: Self-pay

## 2019-04-18 VITALS — BP 112/64 | HR 84 | Wt 197.0 lb

## 2019-04-18 DIAGNOSIS — E032 Hypothyroidism due to medicaments and other exogenous substances: Secondary | ICD-10-CM

## 2019-04-18 DIAGNOSIS — N183 Chronic kidney disease, stage 3 unspecified: Secondary | ICD-10-CM

## 2019-04-18 DIAGNOSIS — I428 Other cardiomyopathies: Secondary | ICD-10-CM

## 2019-04-18 DIAGNOSIS — I5022 Chronic systolic (congestive) heart failure: Secondary | ICD-10-CM

## 2019-04-18 NOTE — Telephone Encounter (Signed)

## 2019-04-18 NOTE — Progress Notes (Signed)
Paramedicine Encounter    Patient ID: Ryan Macdonald, male    DOB: 11/10/1934, 85 y.o.   MRN: [id number]   Patient Care Team: [facility], [facility] as PCP - General (General Practice) [name], MD as PCP - Cardiology (Cardiology) Laurey Morale, MD as PCP - Advanced Heart Failure (Cardiology)  Patient Active Problem List   Diagnosis Date Noted  . AKI (acute kidney injury) (HCC) 03/14/2019  . Spasm of muscle of lower back 03/14/2019  . Acute renal failure (ARF) (HCC) 03/09/2019  . Transaminitis   . Acute on chronic renal failure (HCC) 03/08/2019  . Acute exacerbation of CHF (congestive heart failure) (HCC) 11/23/2018  . Acute on chronic heart failure (HCC) 10/19/2018  . Pulmonary emphysema (HCC)   . Chronic respiratory failure with hypoxia (HCC)   . Morbid obesity (HCC) 09/09/2018  . URI (upper respiratory infection) 08/31/2018  . UTI (urinary tract infection) 08/31/2018  . Chronic systolic (congestive) heart failure (HCC) 08/28/2018  . CHF (congestive heart failure) (HCC) 08/14/2018  . Acute on chronic combined systolic and diastolic CHF (congestive heart failure) (HCC) 07/18/2018  . First degree AV block   . Shortness of breath 04/29/2018  . Acute HFrEF (heart failure with reduced ejection fraction) (HCC) 04/29/2018  . Acute respiratory failure with hypoxia (HCC) 09/27/2017  . CHF exacerbation (HCC) 09/27/2017  . Renal insufficiency   . Acute on chronic systolic CHF (congestive heart failure) (HCC) 07/08/2017  . Left knee pain 07/08/2017  . Acute bronchitis 05/10/2017  . Acute congestive heart failure (HCC) 04/14/2017  . Elevated troponin 04/14/2017  . Lactic acidosis   . Acute on chronic systolic heart failure, NYHA class 2 (HCC) 02/03/2017  . Diabetes mellitus type 2 in obese (HCC) 02/03/2017  . CAP (community acquired pneumonia) 02/03/2017  . Lobar pneumonia (HCC)   . Diabetes mellitus with complication (HCC)   . Acute on chronic respiratory failure with  hypoxia (HCC)   . Acute on chronic combined systolic and diastolic heart failure (HCC) 05/19/2016  . Depression 05/19/2016  . Gout 05/19/2016  . 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 NSVT (nonsustained ventricular tachycardia) (HCC)   . Chronic renal insufficiency, stage 3 (moderate) (HCC)   . Hyperkalemia 08/21/2015  . Pulmonary embolism (HCC)   . Acute respiratory failure (HCC)   . Osteoarthritis of left hip 10/25/2013  . Atrial fibrillation (HCC) 10/25/2013  . S/P hip replacement 10/24/2013  . Nonischemic cardiomyopathy (HCC)   . Obesity (BMI 30-39.9)   . Essential hypertension   . Hyperlipidemia     Current Outpatient Medications:  .  allopurinol (ZYLOPRIM) 100 MG tablet, Take 200 mg by mouth daily as needed (for gout symptoms). , Disp: , Rfl:  .  amiodarone (PACERONE) 200 MG tablet, Take 1 tablet (200 mg total) by mouth daily. (Patient not taking: Reported on 03/28/2019), Disp: 30 tablet, Rfl: 3 .  apixaban (ELIQUIS) 2.5 MG TABS tablet, Take 1 tablet (2.5 mg total) by mouth 2 (two) times daily. (Patient taking differently: Take 2.5 mg by mouth 2 (two) times daily. Caution - blood thinner), Disp: 90 tablet, Rfl: 0 .  cyanocobalamin 1000 MCG tablet, Take 1 tablet (1,000 mcg total) by mouth daily., Disp: 90 tablet, Rfl: 0 .  diclofenac sodium (VOLTAREN) 1 % GEL, Apply 4 g topically 4 (four) times daily., Disp: 1 Tube, Rfl: 0 .  docusate sodium (COLACE) 100 MG capsule, Take 100 mg by mouth 2 (two) times daily., Disp: , Rfl:  .  finasteride (PROSCAR)  5 MG tablet, Take 1 tablet (5 mg total) by mouth daily., Disp: 30 tablet, Rfl: 0 .  fluticasone furoate-vilanterol (BREO ELLIPTA) 100-25 MCG/INH AEPB, Inhale 1 puff into the lungs daily. (Patient not taking: Reported on 04/05/2019), Disp: 60 each, Rfl: 3 .  glipiZIDE (GLUCOTROL) 5 MG tablet, Take 5 mg by mouth daily before breakfast., Disp: , Rfl:  .  hydrALAZINE (APRESOLINE) 25 MG tablet, Take 1 tablet (25 mg  total) by mouth 3 (three) times daily., Disp: 270 tablet, Rfl: 3 .  HYDROcodone-acetaminophen (NORCO/VICODIN) 5-325 MG tablet, Take 2 tablets by mouth every 4 (four) hours as needed. (Patient taking differently: Take 2 tablets by mouth every 4 (four) hours as needed for moderate pain. ), Disp: 6 tablet, Rfl: 0 .  ipratropium-albuterol (DUONEB) 0.5-2.5 (3) MG/3ML SOLN, Take 3 mLs by nebulization every 4 (four) hours as needed., Disp: 360 mL, Rfl: 0 .  isosorbide mononitrate (IMDUR) 30 MG 24 hr tablet, Take 0.5 tablets (15 mg total) by mouth daily., Disp: 15 tablet, Rfl: 5 .  ketoconazole (NIZORAL) 2 % shampoo, Apply 1 application topically 2 (two) times a week., Disp: , Rfl:  .  levothyroxine (SYNTHROID) 50 MCG tablet, Take 1 tablet (50 mcg total) by mouth daily before breakfast., Disp: 30 tablet, Rfl: 6 .  magnesium oxide (MAG-OX) 400 MG tablet, Take 400 mg by mouth daily., Disp: , Rfl:  .  methocarbamol (ROBAXIN) 500 MG tablet, Take 1 tablet (500 mg total) by mouth every 6 (six) hours as needed for muscle spasms., Disp: 30 tablet, Rfl: 0 .  nitroGLYCERIN (NITROSTAT) 0.4 MG SL tablet, Place 1 tablet (0.4 mg total) under the tongue every 5 (five) minutes x 3 doses as needed for chest pain., Disp: 25 tablet, Rfl: 0 .  rosuvastatin (CRESTOR) 10 MG tablet, Take 1 tablet (10 mg total) by mouth daily at 6 PM., Disp: 30 tablet, Rfl: 5 .  sertraline (ZOLOFT) 25 MG tablet, Take 25 mg by mouth daily. , Disp: , Rfl:  .  spironolactone (ALDACTONE) 25 MG tablet, Take 1 tablet (25 mg total) by mouth daily., Disp: 30 tablet, Rfl: 6 .  tamsulosin (FLOMAX) 0.4 MG CAPS capsule, Take 0.4 mg by mouth at bedtime., Disp: , Rfl:  .  torsemide (DEMADEX) 20 MG tablet, Take 2 tablets (40 mg total) by mouth daily. Extra 20 mg if directed by HF clinic, Disp: 75 tablet, Rfl: 3 Allergies  Allergen Reactions  . Lisinopril Swelling    Facial swelling  . Xarelto [Rivaroxaban] Other (See Comments)    "Blood came out of my penis"       Social History   Socioeconomic History  . Marital status: Married    Spouse name: Not on file  . Number of children: Not on file  . Years of education: Not on file  . Highest education level: Not on file  Occupational History  . Occupation: Retired    Comment: Former Patent examiner  . Financial resource strain: Not on file  . Food insecurity:    Worry: Not on file    Inability: Not on file  . Transportation needs:    Medical: Not on file    Non-medical: Not on file  Tobacco Use  . Smoking status: Former Smoker    Packs/day: 1.00    Years: 15.00    Pack years: 15.00    Types: Cigarettes, Cigars  . Smokeless tobacco: Former Neurosurgeon  . Tobacco comment: quit smoking ~ 50 yr ago  Substance and  Sexual Activity  . Alcohol use: No    Alcohol/week: 2.0 standard drinks    Types: 2 Cans of beer per week    Comment: rare beer.  . Drug use: No  . Sexual activity: Yes  Lifestyle  . Physical activity:    Days per week: Not on file    Minutes per session: Not on file  . Stress: Not on file  Relationships  . Social connections:    Talks on phone: Not on file    Gets together: Not on file    Attends religious service: Not on file    Active member of club or organization: Not on file    Attends meetings of clubs or organizations: Not on file    Relationship status: Not on file  . Intimate partner violence:    Fear of current or ex partner: No    Emotionally abused: No    Physically abused: No    Forced sexual activity: No  Other Topics Concern  . Not on file  Social History Narrative   Lives in [place] with wife. Moved from [place] (2013). Does not routinely exercise.    Physical Exam      Future Appointments  Date Time Provider Department Center  04/19/2019 11:00 AM [facility] LAB [facility] None  04/25/2019  3:45 PM Felecia Shelling, DPM [facility]  05/02/2019 10:15 AM Duke Salvia, MD [facility]  05/28/2019  2:20 PM Laurey Morale, MD  [facility] None  06/21/2019  7:20 AM [facility] DEVICE REMOTES [facility]    BP 112/64   Pulse 86   Temp (!) 96.8 F (36 C)   Resp 16   Wt 197 lb (89.4 kg)   SpO2 98%   BMI 30.85 kg/m   Weight yesterday-196 Last visit weight-201  Pt reports he is doing great. He denies increased sob, no dizziness. Pt has pitting edema noted to his lower extremities. He uses compression stockings off and on.  Pt takes his meds. Virtual visit donw with amy. No changes right now. I will see him again next week for visit and refill 2 of his pill boxes. His weight is down 4lbs from last week.   [name], EMT-Paramedic [phone] [facility] Paramedic  04/18/19

## 2019-04-18 NOTE — Progress Notes (Signed)
Heart Failure TeleHealth Note  Due to national recommendations of social distancing due to COVID 19, telehealth visit is felt to be most appropriate for this patient at this time.  I discussed the limitations, risks, security and privacy concerns of performing an evaluation and management service by telephone and the availability of in person appointments. I also discussed with the patient that there may be a patient responsible charge related to this service. The patient expressed understanding and agreed to proceed.   ID:  Ryan Macdonald, DOB 22-Mar-1934, MRN [id number]  Location: Home  Provider location: [address], [place] Type of Visit: Established patient   [facility]:  [facility]  Cardiologist:  Chilton Siiffany [place], MD Primary HF: Dr Shirlee LatchMcLean  Chief Complaint: Heart failure   History of Present Illness: Ryan CoonFred Dunlow is a 85 y.o. male with a history of systolic HF due toNICM, Biotronic ICD, A Fibon Eliquis, DMII, HTN,CAD s/p pLAD stent in 07/2018 with residual non-obstructive disease,chronic hypoxic respiratory failure on home O2 PRN,and depression.  Admitted 12/26 - 11/28/18 with A/C systolic CHF. Diuresed 21 lbs and meds adjusted as tolerated. Echo showed EF 20-25%. EP was consulted for RV pacing and ICD settings were adjusted. TSH at that time was 18.5.   Admitted 03/14/19 with lower pack pain. This was thought to be from muscle spasm. He also had AKI so diuretics were held. He was instructed to restart spiro and torsemide on 4//24/20. He was take on bb and digoxin due to bradycardia.   On 03/20/2019 he had lab work and his TSH was 82. He was instructed to start levothyroxine 50 mcg. He was also instructed to stop potassium for K 5.2.   Patient presents via video audio conferencing for a telehealth visit today. Last visit amiodarone was stopped with amio-induced hypothyroidism (TSH 82) and he was started on synthroid. Daily torsemide was also restarted.  He called HF clinic earlier this month with weight gain and edema. He was instructed to increase torsemide to 40 mg daily and increase spiro to 25 mg daily. He did not come in for repeat labs.  Overall feeling better.  Rarely short of breath. Denies PND/Orthopnea. Able to walk in the grocery store.  No BRBPR.Appetite ok. No fever or chills. Weight at home 197  pounds. Taking all medications.  Followed by HF paramedicine.   Pt denies symptoms of cough, fevers, chills, or new SOB worrisome for COVID 19.    Past Medical History:  Diagnosis Date  . AKI (acute kidney injury) (HCC) 02/2019  . Atrial fibrillation (HCC)   . Cardiomyopathy (HCC)   . Cat scratch fever    removed mass on right side neck  . Cataracts, bilateral   . CHF (congestive heart failure) (HCC)   . Chronic kidney disease   . Corns and callosities   . Diabetes mellitus without complication (HCC)    TYPE 2  . Dyspnea   . Erythema intertrigo   . Flat foot   . Gout   . High cholesterol   . Hx of urethral stricture   . Hypertension   . Kidney stones   . Morbid obesity (HCC)   . Nonischemic cardiomyopathy (HCC)    a. ? 2009 Cath in [place] - nl cors per pt;  b. 09/2013 Echo: EF 25-30%, sev diff HK.  . Obesity   . Osteoarthritis   . PAF (paroxysmal atrial fibrillation) (HCC)    a. post-op hip in 2014 - prev on xarelto.  .Marland Kitchen  Renal insufficiency    Past Surgical History:  Procedure Laterality Date  . CARDIAC CATHETERIZATION  1980's  . CIRCUMCISION  1940's  . CORONARY STENT INTERVENTION N/A 08/18/2018   Procedure: CORONARY STENT INTERVENTION;  Surgeon: Corky Crafts, MD;  Location: [facility] INVASIVE CV LAB;  Service: Cardiovascular;  Laterality: N/A;  . CYSTOSCOPY WITH URETHRAL DILATATION  10/23/2013  . CYSTOSCOPY WITH URETHRAL DILATATION N/A 10/23/2013   Procedure: CYSTOSCOPY WITH URETHRAL DILATATION;  Surgeon: Kathi Ludwig, MD;  Location: [facility] OR;  Service: Urology;  Laterality: N/A;  . INCISION AND DRAINAGE OF WOUND  Right 1980's   "cat scratch" (10/23/2013)  . INGUINAL HERNIA REPAIR Right 1950's  . INTRAVASCULAR ULTRASOUND/IVUS N/A 08/18/2018   Procedure: Intravascular Ultrasound/IVUS;  Surgeon: Corky Crafts, MD;  Location: [facility] INVASIVE CV LAB;  Service: Cardiovascular;  Laterality: N/A;  . KIDNEY STONE SURGERY Right 1970's; 1983   "twice"  . LEFT HEART CATH AND CORONARY ANGIOGRAPHY N/A 08/18/2018   Procedure: LEFT HEART CATH AND CORONARY ANGIOGRAPHY;  Surgeon: Corky Crafts, MD;  Location: [facility] INVASIVE CV LAB;  Service: Cardiovascular;  Laterality: N/A;  . TONSILLECTOMY AND ADENOIDECTOMY  1940's  . TOTAL HIP ARTHROPLASTY Left 10/23/2013   Procedure: TOTAL HIP ARTHROPLASTY;  Surgeon: Valeria Batman, MD;  Location: [facility] OR;  Service: Orthopedics;  Laterality: Left;     Current Outpatient Medications  Medication Sig Dispense Refill  . allopurinol (ZYLOPRIM) 100 MG tablet Take 200 mg by mouth daily as needed (for gout symptoms).     Marland Kitchen apixaban (ELIQUIS) 2.5 MG TABS tablet Take 1 tablet (2.5 mg total) by mouth 2 (two) times daily. (Patient taking differently: Take 2.5 mg by mouth 2 (two) times daily. Caution - blood thinner) 90 tablet 0  . cyanocobalamin 1000 MCG tablet Take 1 tablet (1,000 mcg total) by mouth daily. 90 tablet 0  . diclofenac sodium (VOLTAREN) 1 % GEL Apply 4 g topically 4 (four) times daily. 1 Tube 0  . docusate sodium (COLACE) 100 MG capsule Take 100 mg by mouth 2 (two) times daily.    . finasteride (PROSCAR) 5 MG tablet Take 1 tablet (5 mg total) by mouth daily. 30 tablet 0  . glipiZIDE (GLUCOTROL) 5 MG tablet Take 5 mg by mouth daily before breakfast.    . hydrALAZINE (APRESOLINE) 25 MG tablet Take 1 tablet (25 mg total) by mouth 3 (three) times daily. 270 tablet 3  . HYDROcodone-acetaminophen (NORCO/VICODIN) 5-325 MG tablet Take 2 tablets by mouth every 4 (four) hours as needed. (Patient taking differently: Take 2 tablets by mouth every 4 (four) hours as needed for moderate  pain. ) 6 tablet 0  . ipratropium-albuterol (DUONEB) 0.5-2.5 (3) MG/3ML SOLN Take 3 mLs by nebulization every 4 (four) hours as needed. 360 mL 0  . isosorbide mononitrate (IMDUR) 30 MG 24 hr tablet Take 0.5 tablets (15 mg total) by mouth daily. 15 tablet 5  . ketoconazole (NIZORAL) 2 % shampoo Apply 1 application topically 2 (two) times a week.    . levothyroxine (SYNTHROID) 50 MCG tablet Take 1 tablet (50 mcg total) by mouth daily before breakfast. 30 tablet 6  . magnesium oxide (MAG-OX) 400 MG tablet Take 400 mg by mouth daily.    . methocarbamol (ROBAXIN) 500 MG tablet Take 1 tablet (500 mg total) by mouth every 6 (six) hours as needed for muscle spasms. 30 tablet 0  . nitroGLYCERIN (NITROSTAT) 0.4 MG SL tablet Place 1 tablet (0.4 mg total) under the tongue every 5 (five)  minutes x 3 doses as needed for chest pain. 25 tablet 0  . rosuvastatin (CRESTOR) 10 MG tablet Take 1 tablet (10 mg total) by mouth daily at 6 PM. 30 tablet 5  . sertraline (ZOLOFT) 25 MG tablet Take 25 mg by mouth daily.     Marland Kitchen spironolactone (ALDACTONE) 25 MG tablet Take 1 tablet (25 mg total) by mouth daily. 30 tablet 6  . tamsulosin (FLOMAX) 0.4 MG CAPS capsule Take 0.4 mg by mouth at bedtime.    . torsemide (DEMADEX) 20 MG tablet Take 2 tablets (40 mg total) by mouth daily. Extra 20 mg if directed by HF clinic 75 tablet 3  . amiodarone (PACERONE) 200 MG tablet Take 1 tablet (200 mg total) by mouth daily. (Patient not taking: Reported on 03/28/2019) 30 tablet 3  . fluticasone furoate-vilanterol (BREO ELLIPTA) 100-25 MCG/INH AEPB Inhale 1 puff into the lungs daily. (Patient not taking: Reported on 04/05/2019) 60 each 3   No current facility-administered medications for this encounter.     Allergies:   Lisinopril and Xarelto [rivaroxaban]   Social History:  The patient  reports that he has quit smoking. His smoking use included cigarettes and cigars. He has a 15.00 pack-year smoking history. He has quit using smokeless  tobacco. He reports that he does not drink alcohol or use drugs.   Family History:  The patient's family history includes Heart Problems in his maternal grandmother; Heart disease in his mother; Other in an other family member.   ROS:  Please see the history of present illness.   All other systems are personally reviewed and negative.    Vitals:   04/18/19 1035  BP: 112/64  Pulse: 84  SpO2: 98%    Exam:  Video Health Call; Exam is visual General:  Speaks in full sentences. No resp difficulty. Lungs: Normal respiratory effort with conversation.  Abdomen: No distension per patient report Extremities: R and LLE 1+ edema. Neuro: Alert & oriented x 3.   SR 833  Recent Labs: 11/23/2018: B Natriuretic Peptide 1,382.9 03/10/2019: Magnesium 1.6 03/20/2019: ALT 40; Hemoglobin 10.7; Platelets 258; TSH 82.403 04/02/2019: BUN 25; Creatinine, Ser 1.75; Potassium 3.6; Sodium 140  Personally reviewed   Wt Readings from Last 3 Encounters:  04/18/19 89.4 kg (197 lb)  04/18/19 89.4 kg (197 lb)  04/12/19 91.2 kg (201 lb)      ASSESSMENT AND PLAN:  1. Chronic systolic Heart Failure. Due to NICM. EF has been low since at least 2014. -He has single chamber Biotronik ICD. Narrow QRS.Settings adjusted to prevent RV pacing. -Echo12/27/19: EF 20-25%, grade 3 DD, trivial AI, mod MR, LA mild to moderately dilated, RV moderately reduced, RA mildly dilated, PA peak pressure 39 mm Hg -NYHA II-III. Volume status stable. Continue torsemide 40 mg daily. Instructed to wear compression stockings.  - Continuespiro25 mg qhs. - Continue hydral 25 mg TID + imdur 15 mg daily. Bidil not covered by [facility]. -Off digoxin recent hospitalization due to bradycardia.   -Hold BB with low BP and long PR interval -No ACEI or ARNI with allergy to lisinopril.Hold off on ARB with CKD. - Consider cardiomems.  2. CAD - S/p PCI 07/2018 to pLAD - No chest pain.  - Off plavix in March.  - Continue apixiban 2.5 mg  BID. - Continue crestor  3.ParoxysmalA fib - Irregular per paramedic report.  - Off amiodarone with TSH 82.  - Continue eliquis2.5mg  twice a day (age >4, creatinine >1.5generally).  4. RV pacing - He had  anICD program change in July and sounds like he has been RV pacing since then. - Biotronic ICD reprogrammed12/27by EPto prevent RV pacing and he had no further pacing. - He wants to keep EP follow up with the [place]. Unable to interrogate Biotronic device.   5. CKD III - Creatinine baseline1.8 - 2.0. Recent AKI.  Check BMET this week.   6. Hypothyroidism --> amiodarone  TSH 82 4//21/20 .  - Continue 50 mcg levothyroxine. Now off amiodarone.  - Check TSH this week.    COVID screen The patient does not have any symptoms that suggest any further testing/ screening at this time.  Social distancing reinforced today.  Patient Risk: After full review of this patients clinical status, I feel that they are at moderate risk for cardiac decompensation at this time.  Orders/Follow up: Check BMET and TSH this week. Follow up in 6 weeks for telehealth visit with Amy  Today, I have spent 15 minutes with the patient with telehealth technology discussing the above. Waneta Martins, NP  04/18/2019 10:35 AM   [facility] [address] [facility] [place] [address] [phone] (office) [phone] (fax)

## 2019-04-18 NOTE — Telephone Encounter (Signed)
Reviewed AVS:     Check BMET and TSH this week. /scheduled for 5/21 @11am   Follow up in 6 weeks for telehealth visit with Amy /scheduled 6/29 @220pm 

## 2019-04-18 NOTE — Addendum Note (Signed)
Encounter addended by: Shea Stakes, RN on: 04/18/2019 10:55 AM  Actions taken: Visit diagnoses modified, Order list changed, Diagnosis association updated

## 2019-04-19 ENCOUNTER — Other Ambulatory Visit: Payer: Self-pay

## 2019-04-19 ENCOUNTER — Ambulatory Visit (HOSPITAL_COMMUNITY)
Admission: RE | Admit: 2019-04-19 | Discharge: 2019-04-19 | Disposition: A | Discharge status: Home / Self Care | Payer: No Typology Code available for payment source | Source: Ambulatory Visit | Attending: Internal Medicine | Admitting: Internal Medicine

## 2019-04-19 DIAGNOSIS — N183 Chronic kidney disease, stage 3 unspecified: Secondary | ICD-10-CM

## 2019-04-19 DIAGNOSIS — I5022 Chronic systolic (congestive) heart failure: Secondary | ICD-10-CM | POA: Insufficient documentation

## 2019-04-19 LAB — BASIC METABOLIC PANEL
Anion gap: 11 (ref 5–15)
BUN: 50 mg/dL — ABNORMAL HIGH (ref 8–23)
CO2: 26 mmol/L (ref 22–32)
Calcium: 9.1 mg/dL (ref 8.9–10.3)
Chloride: 105 mmol/L (ref 98–111)
Creatinine, Ser: 1.88 mg/dL — ABNORMAL HIGH (ref 0.61–1.24)
GFR calc Af Amer: 38 mL/min — ABNORMAL LOW (ref >60)
GFR calc non Af Amer: 33 mL/min — ABNORMAL LOW (ref >60)
Glucose, Bld: 104 mg/dL — ABNORMAL HIGH (ref 70–99)
Potassium: 4.2 mmol/L (ref 3.5–5.1)
Sodium: 142 mmol/L (ref 135–145)

## 2019-04-19 LAB — TSH: TSH: 55.113 u[IU]/mL — ABNORMAL HIGH (ref 0.350–4.500)

## 2019-04-24 ENCOUNTER — Telehealth (HOSPITAL_COMMUNITY): Payer: Self-pay

## 2019-04-24 NOTE — Telephone Encounter (Signed)
-----   Message from Laurey Morale, MD sent at 04/19/2019  3:52 PM EDT ----- TSH still high. Increase Levoxyl to 100 mcg daily and have him repeat TSH in 1 month.  Needs followup on this with his PCP.  Also, would decrease torsemide to 40 daily alternating with 20 daily and repeat BMET in 10 days with elevated BUN.

## 2019-04-24 NOTE — Telephone Encounter (Signed)
Left VM on mobile number

## 2019-04-25 ENCOUNTER — Telehealth: Payer: Self-pay

## 2019-04-25 ENCOUNTER — Telehealth (HOSPITAL_COMMUNITY): Payer: Self-pay | Admitting: Cardiology

## 2019-04-25 ENCOUNTER — Ambulatory Visit: Payer: Medicare Other | Admitting: Podiatry

## 2019-04-25 ENCOUNTER — Other Ambulatory Visit (HOSPITAL_COMMUNITY): Payer: Self-pay

## 2019-04-25 ENCOUNTER — Other Ambulatory Visit: Payer: Self-pay

## 2019-04-25 DIAGNOSIS — E039 Hypothyroidism, unspecified: Secondary | ICD-10-CM

## 2019-04-25 DIAGNOSIS — I5022 Chronic systolic (congestive) heart failure: Secondary | ICD-10-CM

## 2019-04-25 MED ORDER — TORSEMIDE 20 MG PO TABS: ORAL_TABLET | ORAL | 3 refills | Status: DC

## 2019-04-25 MED ORDER — LEVOTHYROXINE SODIUM 100 MCG PO TABS: 100.0000 ug | ORAL_TABLET | Freq: Every day | ORAL | 1 refills | Status: DC

## 2019-04-25 NOTE — Telephone Encounter (Signed)
-----   Message from Dalton S McLean, MD sent at 04/19/2019  3:52 PM EDT ----- TSH still high. Increase Levoxyl to 100 mcg daily and have him repeat TSH in 1 month.  Needs followup on this with his PCP.  Also, would decrease torsemide to 40 daily alternating with 20 daily and repeat BMET in 10 days with elevated BUN. 

## 2019-04-25 NOTE — Telephone Encounter (Signed)
Notes recorded by Theresia Bough, CMA on 04/25/2019 at 1:20 PM EDT Pt aware via Katie at home visit ------  Notes recorded by Shea Stakes, RN on 04/24/2019 at 10:02 AM EDT Left VM on mobile number ------  Notes recorded by Marisa Hua, RN on 04/19/2019 at 4:35 PM EDT Called patient, voicemail full, unable to leave a message ------  Notes recorded by Laurey Morale, MD on 04/19/2019 at 3:52 PM EDT TSH still high. Increase Levoxyl to 100 mcg daily and have him repeat TSH in 1 month. Needs followup on this with his PCP. Also, would decrease torsemide to 40 daily alternating with 20 daily and repeat BMET in 10 days with elevated BUN.

## 2019-04-25 NOTE — Progress Notes (Signed)
Paramedicine Encounter    Patient ID: Ryan Macdonald, male    DOB: 05/02/1934, 85 y.o.   MRN: [id number]   Patient Care Team: [facility] as PCP - General (General Practice) [name], MD as PCP - Cardiology (Cardiology) Laurey Morale, MD as PCP - Advanced Heart Failure (Cardiology)  Patient Active Problem List   Diagnosis Date Noted  . AKI (acute kidney injury) (HCC) 03/14/2019  . Spasm of muscle of lower back 03/14/2019  . Acute renal failure (ARF) (HCC) 03/09/2019  . Transaminitis   . Acute on chronic renal failure (HCC) 03/08/2019  . Acute exacerbation of CHF (congestive heart failure) (HCC) 11/23/2018  . Acute on chronic heart failure (HCC) 10/19/2018  . Pulmonary emphysema (HCC)   . Chronic respiratory failure with hypoxia (HCC)   . Morbid obesity (HCC) 09/09/2018  . URI (upper respiratory infection) 08/31/2018  . UTI (urinary tract infection) 08/31/2018  . Chronic systolic (congestive) heart failure (HCC) 08/28/2018  . CHF (congestive heart failure) (HCC) 08/14/2018  . Acute on chronic combined systolic and diastolic CHF (congestive heart failure) (HCC) 07/18/2018  . First degree AV block   . Shortness of breath 04/29/2018  . Acute HFrEF (heart failure with reduced ejection fraction) (HCC) 04/29/2018  . Acute respiratory failure with hypoxia (HCC) 09/27/2017  . CHF exacerbation (HCC) 09/27/2017  . Renal insufficiency   . Acute on chronic systolic CHF (congestive heart failure) (HCC) 07/08/2017  . Left knee pain 07/08/2017  . Acute bronchitis 05/10/2017  . Acute congestive heart failure (HCC) 04/14/2017  . Elevated troponin 04/14/2017  . Lactic acidosis   . Acute on chronic systolic heart failure, NYHA class 2 (HCC) 02/03/2017  . Diabetes mellitus type 2 in obese (HCC) 02/03/2017  . CAP (community acquired pneumonia) 02/03/2017  . Lobar pneumonia (HCC)   . Diabetes mellitus with complication (HCC)   . Acute on chronic respiratory failure with  hypoxia (HCC)   . Acute on chronic combined systolic and diastolic heart failure (HCC) 05/19/2016  . Depression 05/19/2016  . Gout 05/19/2016  . 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 NSVT (nonsustained ventricular tachycardia) (HCC)   . Chronic renal insufficiency, stage 3 (moderate) (HCC)   . Hyperkalemia 08/21/2015  . Pulmonary embolism (HCC)   . Acute respiratory failure (HCC)   . Osteoarthritis of left hip 10/25/2013  . Atrial fibrillation (HCC) 10/25/2013  . S/P hip replacement 10/24/2013  . Nonischemic cardiomyopathy (HCC)   . Obesity (BMI 30-39.9)   . Essential hypertension   . Hyperlipidemia     Current Outpatient Medications:  .  allopurinol (ZYLOPRIM) 100 MG tablet, Take 200 mg by mouth daily as needed (for gout symptoms). , Disp: , Rfl:  .  apixaban (ELIQUIS) 2.5 MG TABS tablet, Take 1 tablet (2.5 mg total) by mouth 2 (two) times daily. (Patient taking differently: Take 2.5 mg by mouth 2 (two) times daily. Caution - blood thinner), Disp: 90 tablet, Rfl: 0 .  diclofenac sodium (VOLTAREN) 1 % GEL, Apply 4 g topically 4 (four) times daily., Disp: 1 Tube, Rfl: 0 .  docusate sodium (COLACE) 100 MG capsule, Take 100 mg by mouth 2 (two) times daily., Disp: , Rfl:  .  finasteride (PROSCAR) 5 MG tablet, Take 1 tablet (5 mg total) by mouth daily., Disp: 30 tablet, Rfl: 0 .  glipiZIDE (GLUCOTROL) 5 MG tablet, Take 5 mg by mouth daily before breakfast., Disp: , Rfl:  .  hydrALAZINE (APRESOLINE) 25 MG tablet, Take 1 tablet (25  mg total) by mouth 3 (three) times daily., Disp: 270 tablet, Rfl: 3 .  HYDROcodone-acetaminophen (NORCO/VICODIN) 5-325 MG tablet, Take 2 tablets by mouth every 4 (four) hours as needed. (Patient taking differently: Take 2 tablets by mouth every 4 (four) hours as needed for moderate pain. ), Disp: 6 tablet, Rfl: 0 .  ipratropium-albuterol (DUONEB) 0.5-2.5 (3) MG/3ML SOLN, Take 3 mLs by nebulization every 4 (four) hours as needed., Disp:  360 mL, Rfl: 0 .  isosorbide mononitrate (IMDUR) 30 MG 24 hr tablet, Take 0.5 tablets (15 mg total) by mouth daily., Disp: 15 tablet, Rfl: 5 .  ketoconazole (NIZORAL) 2 % shampoo, Apply 1 application topically 2 (two) times a week., Disp: , Rfl:  .  magnesium oxide (MAG-OX) 400 MG tablet, Take 400 mg by mouth daily., Disp: , Rfl:  .  methocarbamol (ROBAXIN) 500 MG tablet, Take 1 tablet (500 mg total) by mouth every 6 (six) hours as needed for muscle spasms., Disp: 30 tablet, Rfl: 0 .  rosuvastatin (CRESTOR) 10 MG tablet, Take 1 tablet (10 mg total) by mouth daily at 6 PM., Disp: 30 tablet, Rfl: 5 .  sertraline (ZOLOFT) 25 MG tablet, Take 25 mg by mouth daily. , Disp: , Rfl:  .  spironolactone (ALDACTONE) 25 MG tablet, Take 1 tablet (25 mg total) by mouth daily., Disp: 30 tablet, Rfl: 6 .  tamsulosin (FLOMAX) 0.4 MG CAPS capsule, Take 0.4 mg by mouth at bedtime., Disp: , Rfl:  .  amiodarone (PACERONE) 200 MG tablet, Take 1 tablet (200 mg total) by mouth daily. (Patient not taking: Reported on 03/28/2019), Disp: 30 tablet, Rfl: 3 .  cyanocobalamin 1000 MCG tablet, Take 1 tablet (1,000 mcg total) by mouth daily. (Patient not taking: Reported on 04/25/2019), Disp: 90 tablet, Rfl: 0 .  fluticasone furoate-vilanterol (BREO ELLIPTA) 100-25 MCG/INH AEPB, Inhale 1 puff into the lungs daily. (Patient not taking: Reported on 04/05/2019), Disp: 60 each, Rfl: 3 .  levothyroxine (SYNTHROID) 100 MCG tablet, Take 1 tablet (100 mcg total) by mouth daily before breakfast., Disp: 90 tablet, Rfl: 1 .  nitroGLYCERIN (NITROSTAT) 0.4 MG SL tablet, Place 1 tablet (0.4 mg total) under the tongue every 5 (five) minutes x 3 doses as needed for chest pain. (Patient not taking: Reported on 04/25/2019), Disp: 25 tablet, Rfl: 0 .  torsemide (DEMADEX) 20 MG tablet, Take 40 mg daily alternating with 20 mg daily, Disp: 90 tablet, Rfl: 3 Allergies  Allergen Reactions  . Lisinopril Swelling    Facial swelling  . Xarelto [Rivaroxaban]  Other (See Comments)    "Blood came out of my penis"      Social History   Socioeconomic History  . Marital status: Married    Spouse name: Not on file  . Number of children: Not on file  . Years of education: Not on file  . Highest education level: Not on file  Occupational History  . Occupation: Retired    Comment: Former Patent examiner  . Financial resource strain: Not on file  . Food insecurity:    Worry: Not on file    Inability: Not on file  . Transportation needs:    Medical: Not on file    Non-medical: Not on file  Tobacco Use  . Smoking status: Former Smoker    Packs/day: 1.00    Years: 15.00    Pack years: 15.00    Types: Cigarettes, Cigars  . Smokeless tobacco: Former Neurosurgeon  . Tobacco comment: quit smoking ~ 50 yr  ago  Substance and Sexual Activity  . Alcohol use: No    Alcohol/week: 2.0 standard drinks    Types: 2 Cans of beer per week    Comment: rare beer.  . Drug use: No  . Sexual activity: Yes  Lifestyle  . Physical activity:    Days per week: Not on file    Minutes per session: Not on file  . Stress: Not on file  Relationships  . Social connections:    Talks on phone: Not on file    Gets together: Not on file    Attends religious service: Not on file    Active member of club or organization: Not on file    Attends meetings of clubs or organizations: Not on file    Relationship status: Not on file  . Intimate partner violence:    Fear of current or ex partner: No    Emotionally abused: No    Physically abused: No    Forced sexual activity: No  Other Topics Concern  . Not on file  Social History Narrative   Lives in [place] with wife. Moved from [place] (2013). Does not routinely exercise.    Physical Exam      Future Appointments  Date Time Provider Department Center  04/25/2019  3:45 PM Felecia Shelling, [place] [facility]  05/02/2019 10:15 AM Duke Salvia, MD [facility]  05/08/2019 11:00 AM [facility] LAB  [facility] None  05/28/2019  2:20 PM Laurey Morale, MD [facility] None  06/13/2019 11:00 AM [facility] LAB [facility] None  06/21/2019  7:20 AM [facility] DEVICE REMOTES [facility]    BP 104/60   Pulse 78   Temp 97.7 F (36.5 C)   Resp 15   Wt 199 lb (90.3 kg)   SpO2 98%   BMI 31.17 kg/m  B/p standing-100/64 Weight yesterday-200 Last visit weight-197 [facility]-110  Pt reports he is doing well. Pt denies sob, no dizziness. Clinic staff was unable to reach him from his last lab appoint--his levothyroxine is being increased to 100 mcg. He was advised that he needed to see PCP for follow up for that. He does not like that idea as he has to drive so far to have that lab work done and to be followed for that.  His torsemide also changed to 40/20mg  alternating days due to labs. That change was made in pill box and also set his next BMET appointment.  He was eating wendys chicken nuggets when I arrived--I advised him to be cautious what he eats and how much fluid intake he does esp now since his torsemide is being decreased. Wearing compression stockings. Going to foot doc today.   [name], EMT-Paramedic [phone] [facility] Par[facility]ic  04/25/19

## 2019-04-25 NOTE — Telephone Encounter (Signed)
I called and left patient a message about switching over office visit on 05/02/19 to telehealth visit.

## 2019-04-27 NOTE — Telephone Encounter (Signed)
I called and spoke with patients wife Ryan Macdonald, ok per patient DPR. Ryan Macdonald is aware that office visit on Wednesday 05/02/19 will be a telehealth visit. Ryan Macdonald states it will have to be a phone call, their computer does not have a camera and they do not have smart phones.      Virtual Visit Pre-Appointment Phone Call  "(Name), I am calling you today to discuss your upcoming appointment. We are currently trying to limit exposure to the virus that causes COVID-19 by seeing patients at home rather than in the office."  1. "What is the BEST phone number to call the day of the visit?" - include this in appointment notes  2. "Do you have or have access to (through a family member/friend) a smartphone with video capability that we can use for your visit?" a. If yes - list this number in appt notes as "cell" (if different from BEST phone #) and list the appointment type as a VIDEO visit in appointment notes b. If no - list the appointment type as a PHONE visit in appointment notes  3. Confirm consent - "In the setting of the current Covid19 crisis, you are scheduled for a (phone or video) visit with your provider on (date) at (time).  Just as we do with many in-office visits, in order for you to participate in this visit, we must obtain consent.  If you'd like, I can send this to your mychart (if signed up) or email for you to review.  Otherwise, I can obtain your verbal consent now.  All virtual visits are billed to your insurance company just like a normal visit would be.  By agreeing to a virtual visit, we'd like you to understand that the technology does not allow for your provider to perform an examination, and thus may limit your provider's ability to fully assess your condition. If your provider identifies any concerns that need to be evaluated in person, we will make arrangements to do so.  Finally, though the technology is pretty good, we cannot assure that it will always work on either your or our end,  and in the setting of a video visit, we may have to convert it to a phone-only visit.  In either situation, we cannot ensure that we have a secure connection.  Are you willing to proceed?" STAFF: Did the patient verbally acknowledge consent to telehealth visit? Document YES/NO here: YES  4. Advise patient to be prepared - "Two hours prior to your appointment, go ahead and check your blood pressure, pulse, oxygen saturation, and your weight (if you have the equipment to check those) and write them all down. When your visit starts, your provider will ask you for this information. If you have an Apple Watch or Kardia device, please plan to have heart rate information ready on the day of your appointment. Please have a pen and paper handy nearby the day of the visit as well."  5. Give patient instructions for MyChart download to smartphone OR Doximity/Doxy.me as below if video visit (depending on what platform provider is using)  6. Inform patient they will receive a phone call 15 minutes prior to their appointment time (may be from unknown caller ID) so they should be prepared to answer    TELEPHONE CALL NOTE  Ryan Macdonald has been deemed a candidate for a follow-up tele-health visit to limit community exposure during the Covid-19 pandemic. I spoke with the patient via phone to ensure availability of phone/video source, confirm preferred  email & phone number, and discuss instructions and expectations.  I reminded Ryan Macdonald to be prepared with any vital sign and/or heart rhythm information that could potentially be obtained via home monitoring, at the time of his visit. I reminded Ryan Macdonald to expect a phone call prior to his visit.  Lajoyce Corners, CMA 04/27/2019 2:41 PM   INSTRUCTIONS FOR DOWNLOADING THE MYCHART APP TO SMARTPHONE  - The patient must first make sure to have activated MyChart and know their login information - If Apple, go to [employer] and type in MyChart in the search  bar and download the app. If Android, ask patient to go to [employer] and type in [place] in the search bar and download the app. The app is free but as with any other app downloads, their phone may require them to verify saved payment information or Apple/Android password.  - The patient will need to then log into the app with their MyChart username and password, and select [facility] as their healthcare provider to link the account. When it is time for your visit, go to the MyChart app, find appointments, and click Begin Video Visit. Be sure to Select Allow for your device to access the Microphone and Camera for your visit. You will then be connected, and your provider will be with you shortly.  **If they have any issues connecting, or need assistance please contact MyChart service desk [phone])**  **If using a computer, in order to ensure the best quality for their visit they will need to use either of the following Internet Browsers: [employer] or Google Chrome**  IF USING DOXIMITY or DOXY.ME - The patient will receive a link just prior to their visit by text.     FULL LENGTH CONSENT FOR TELE-HEALTH VISIT   I hereby voluntarily request, consent and authorize [facility] and its employed or contracted physicians, physician assistants, nurse practitioners or other licensed health care professionals (the Practitioner), to provide me with telemedicine health care services (the "Services") as deemed necessary by the treating Practitioner. I acknowledge and consent to receive the Services by the Practitioner via telemedicine. I understand that the telemedicine visit will involve communicating with the Practitioner through live audiovisual communication technology and the disclosure of certain medical information by electronic transmission. I acknowledge that I have been given the opportunity to request an in-person assessment or other available alternative prior to the  telemedicine visit and am voluntarily participating in the telemedicine visit.  I understand that I have the right to withhold or withdraw my consent to the use of telemedicine in the course of my care at any time, without affecting my right to future care or treatment, and that the Practitioner or I may terminate the telemedicine visit at any time. I understand that I have the right to inspect all information obtained and/or recorded in the course of the telemedicine visit and may receive copies of available information for a reasonable fee.  I understand that some of the potential risks of receiving the Services via telemedicine include:  Marland Kitchen Delay or interruption in medical evaluation due to technological equipment failure or disruption; . Information transmitted may not be sufficient (e.g. poor resolution of images) to allow for appropriate medical decision making by the Practitioner; and/or  . In rare instances, security protocols could fail, causing a breach of personal health information.  Furthermore, I acknowledge that it is my responsibility to provide information about my medical history, conditions and care  that is complete and accurate to the best of my ability. I acknowledge that Practitioner's advice, recommendations, and/or decision may be based on factors not within their control, such as incomplete or inaccurate data provided by me or distortions of diagnostic images or specimens that may result from electronic transmissions. I understand that the practice of medicine is not an exact science and that Practitioner makes no warranties or guarantees regarding treatment outcomes. I acknowledge that I will receive a copy of this consent concurrently upon execution via email to the email address I last provided but may also request a printed copy by calling the office of [facility].    I understand that my insurance will be billed for this visit.   I have read or had this consent read to me.  . I understand the contents of this consent, which adequately explains the benefits and risks of the Services being provided via telemedicine.  . I have been provided ample opportunity to ask questions regarding this consent and the Services and have had my questions answered to my satisfaction. . I give my informed consent for the services to be provided through the use of telemedicine in my medical care  By participating in this telemedicine visit I agree to the above.

## 2019-05-02 ENCOUNTER — Other Ambulatory Visit: Payer: Self-pay

## 2019-05-02 ENCOUNTER — Encounter: Payer: Self-pay | Admitting: Internal Medicine

## 2019-05-02 ENCOUNTER — Telehealth (HOSPITAL_COMMUNITY): Payer: Self-pay

## 2019-05-02 ENCOUNTER — Telehealth (INDEPENDENT_AMBULATORY_CARE_PROVIDER_SITE_OTHER): Payer: Self-pay | Admitting: Internal Medicine

## 2019-05-02 VITALS — BP 116/67 | Ht 67.0 in | Wt 198.0 lb

## 2019-05-02 DIAGNOSIS — I5042 Chronic combined systolic (congestive) and diastolic (congestive) heart failure: Secondary | ICD-10-CM

## 2019-05-02 DIAGNOSIS — I428 Other cardiomyopathies: Secondary | ICD-10-CM

## 2019-05-02 DIAGNOSIS — I5022 Chronic systolic (congestive) heart failure: Secondary | ICD-10-CM

## 2019-05-02 DIAGNOSIS — I480 Paroxysmal atrial fibrillation: Secondary | ICD-10-CM

## 2019-05-02 NOTE — Patient Instructions (Signed)
Called pt and discussed the following recommendations. He has verbalized understanding and will have his CBC drawn during his [facility] blood draw next week.    Medication Instructions:  Your physician recommends that you continue on your current medications as directed. Please refer to the Current Medication list given to you today.   Labwork: Your physician recommends that you return for lab work June 9th when you have labs drawn at the heart failure clinic.   Testing/Procedures: None ordered.   Follow-Up: Your physician recommends that you schedule a follow-up appointment in: 1 year with Dr Graciela Husbands   Any Other Special Instructions Will Be Listed Below (If Applicable).     If you need a refill on your cardiac medications before your next appointment, please call your pharmacy.

## 2019-05-02 NOTE — Telephone Encounter (Signed)
-----   Message from Laurey Morale, MD sent at 05/02/2019 12:32 PM EDT ----- Needs CBC and BMET, will check with our nurses to see if set up.   Anthoney Sheppard/Jennifer, can you make sure he has a CBC/BMET arranged, can do this week.  ----- Message ----- From: Duke Salvia, MD Sent: 05/02/2019  11:16 AM EDT To: Laurey Morale, MD, Noralee Space, RN  [place]  you have repeat blood work scheduled,  I was impressed about the Bun /Cr ratio changing, ie BUN 28>>50 w essentially stable Cr   Has hx of anemia, ferritin normal about a year ago( too long) so wondered whether w next blood draw, a CBC made sense

## 2019-05-02 NOTE — Progress Notes (Signed)
Electrophysiology TeleHealth Note   Due to national recommendations of social distancing due to COVID 19, an audio/video telehealth visit is felt to be most appropriate for this patient at this time. The patient did not have access to video technology/had technical difficulties with video requiring transitioning to audio format only (telephone).  All issues noted in this document were discussed and addressed.  No physical exam could be performed with this format.     See MyChart message from today for the patient's consent to telehealth for Ryan Macdonald.   Date:  05/02/2019   ID:  Ryan Macdonald, DOB [birth year], MRN [id number]  Location: patient's home  Provider location: [address], [place]  Evaluation Performed: Initial Evaluation  PCP:  [facility] [place]  Cardiologist:  TR/DM Electrophysiologist:  SK  Chief Complaint:  ICD   History of Present Illness:    Ryan Macdonald is a 85 y.o. male who presents via audio/video conferencing for a telehealth visit today for  ICD followup        Ryan Macdonald is followed at the [place] primarily.  Also by Dr. TR/DM.  We met him 12/19 when he presented with recurrent heart failure hospitalizations.  His ICD has been programmed VVI at 22 and we reprogrammed it for P synchronous pacing ( Biotronik VDD device) which occurred with a PR interval of 250 ms and a narrow QRS.  When last seen by Dr. Wallis Bamberg 3/20 described as "doing well currently "  Also has atrial fibrillation on Eliquis and amiodarone.  He has also been found to be intercurrently very hypothyroid; most recent TSH 04/19/2019 still greater than 50.  Levothyroxine uptitrated.  DATE TEST EF   9/19 LHC   % pLAD 75>>stent  12/19 Echo   20-25 % MR Mod; LAE (48/2.15/30)        Date BUN/Cr K Hgb  4/20 21/1.96 5.2 10.7  5/20 50/1.88 4.2     Enthusiastically grateful for his improved status, that "somebody listened and cared"  The patient denies SOB, chest pain edema or  palpitations.  There has been no syncope or presyncope.   The patient denies symptoms of fevers, chills, cough, or new SOB worrisome for COVID 19.   Past Medical History:  Diagnosis Date   AKI (acute kidney injury) ([facility]) 02/2019   Atrial fibrillation (HCC)    Cardiomyopathy (HCC)    Cat scratch fever    removed mass on right side neck   Cataracts, bilateral    CHF (congestive heart failure) (HCC)    Chronic kidney disease    Corns and callosities    Diabetes mellitus without complication (HCC)    TYPE 2   Dyspnea    Erythema intertrigo    Flat foot    Gout    High cholesterol    Hx of urethral stricture    Hypertension    Kidney stones    Morbid obesity ([facility])    Nonischemic cardiomyopathy ([facility])    a. ? 2009 Cath in [place] - nl cors per pt;  b. 09/2013 Echo: EF 25-30%, sev diff HK.   Obesity    Osteoarthritis    PAF (paroxysmal atrial fibrillation) ([place])    a. post-op hip in 2014 - prev on xarelto.   Renal insufficiency     Past Surgical History:  Procedure Laterality Date   CARDIAC CATHETERIZATION  1980's   CIRCUMCISION  1940's   CORONARY STENT INTERVENTION N/A 08/18/2018   Procedure: CORONARY STENT INTERVENTION;  Surgeon: Jettie Booze, MD;  Location: [facility];  Service: Cardiovascular;  Laterality: N/A;   CYSTOSCOPY WITH URETHRAL DILATATION  10/23/2013   CYSTOSCOPY WITH URETHRAL DILATATION N/A 10/23/2013   Procedure: CYSTOSCOPY WITH URETHRAL DILATATION;  Surgeon: Ailene Rud, MD;  Location: [facility];  Service: Urology;  Laterality: N/A;   INCISION AND DRAINAGE OF WOUND Right 1980's   "cat scratch" (10/23/2013)   INGUINAL HERNIA REPAIR Right 1950's   INTRAVASCULAR ULTRASOUND/IVUS N/A 08/18/2018   Procedure: Intravascular Ultrasound/IVUS;  Surgeon: Jettie Booze, MD;  Location: [facility];  Service: Cardiovascular;  Laterality: N/A;   KIDNEY STONE SURGERY Right 1970's; 1983   "twice"   LEFT HEART CATH  AND CORONARY ANGIOGRAPHY N/A 08/18/2018   Procedure: LEFT HEART CATH AND CORONARY ANGIOGRAPHY;  Surgeon: Jettie Booze, MD;  Location: [facility];  Service: Cardiovascular;  Laterality: N/A;   TONSILLECTOMY AND ADENOIDECTOMY  1940's   TOTAL HIP ARTHROPLASTY Left 10/23/2013   Procedure: TOTAL HIP ARTHROPLASTY;  Surgeon: Garald Balding, MD;  Location: [facility];  Service: Orthopedics;  Laterality: Left;    Current Outpatient Medications  Medication Sig Dispense Refill   allopurinol (ZYLOPRIM) 100 MG tablet Take 200 mg by mouth daily as needed (for gout symptoms).      amiodarone (PACERONE) 200 MG tablet Take 1 tablet (200 mg total) by mouth daily. 30 tablet 3   apixaban (ELIQUIS) 2.5 MG TABS tablet Take 1 tablet (2.5 mg total) by mouth 2 (two) times daily. 90 tablet 0   cyanocobalamin 1000 MCG tablet Take 1 tablet (1,000 mcg total) by mouth daily. 90 tablet 0   diclofenac sodium (VOLTAREN) 1 % GEL Apply 4 g topically 4 (four) times daily. 1 Tube 0   docusate sodium (COLACE) 100 MG capsule Take 100 mg by mouth 2 (two) times daily.     finasteride (PROSCAR) 5 MG tablet Take 1 tablet (5 mg total) by mouth daily. 30 tablet 0   glipiZIDE (GLUCOTROL) 5 MG tablet Take 5 mg by mouth daily before breakfast.     hydrALAZINE (APRESOLINE) 25 MG tablet Take 1 tablet (25 mg total) by mouth 3 (three) times daily. 270 tablet 3   HYDROcodone-acetaminophen (NORCO/VICODIN) 5-325 MG tablet Take 2 tablets by mouth every 4 (four) hours as needed. 6 tablet 0   ipratropium-albuterol (DUONEB) 0.5-2.5 (3) MG/3ML SOLN Take 3 mLs by nebulization every 4 (four) hours as needed. 360 mL 0   isosorbide mononitrate (IMDUR) 30 MG 24 hr tablet Take 0.5 tablets (15 mg total) by mouth daily. 15 tablet 5   ketoconazole (NIZORAL) 2 % shampoo Apply 1 application topically 2 (two) times a week.     levothyroxine (SYNTHROID) 100 MCG tablet Take 1 tablet (100 mcg total) by mouth daily before breakfast. 90  tablet 1   magnesium oxide (MAG-OX) 400 MG tablet Take 400 mg by mouth daily.     methocarbamol (ROBAXIN) 500 MG tablet Take 1 tablet (500 mg total) by mouth every 6 (six) hours as needed for muscle spasms. 30 tablet 0   nitroGLYCERIN (NITROSTAT) 0.4 MG SL tablet Place 1 tablet (0.4 mg total) under the tongue every 5 (five) minutes x 3 doses as needed for chest pain. 25 tablet 0   rosuvastatin (CRESTOR) 10 MG tablet Take 1 tablet (10 mg total) by mouth daily at 6 PM. 30 tablet 5   sertraline (ZOLOFT) 25 MG tablet Take 25 mg by mouth daily.      spironolactone (  ALDACTONE) 25 MG tablet Take 1 tablet (25 mg total) by mouth daily. 30 tablet 6   tamsulosin (FLOMAX) 0.4 MG CAPS capsule Take 0.4 mg by mouth at bedtime.     torsemide (DEMADEX) 20 MG tablet Take 40 mg daily alternating with 20 mg daily 90 tablet 3   No current facility-administered medications for this visit.     Allergies:   Lisinopril and Xarelto [rivaroxaban]   Social History:  The patient  reports that he has quit smoking. His smoking use included cigarettes and cigars. He has a 15.00 pack-year smoking history. He has quit using smokeless tobacco. He reports that he does not drink alcohol or use drugs.   Family History:  The patient's   family history includes Heart Problems in his maternal grandmother; Heart disease in his mother; Other in an other family member.   ROS:  Please see the history of present illness.   All other systems are personally reviewed and negative.    Exam:    Vital Signs:  BP 116/67    Ht _0  (1.702 m)    Wt 198 lb (89.8 kg)    BMI 31.01 kg/m       Labs/Other Tests and Data Reviewed:    Recent Labs: 11/23/2018: B Natriuretic Peptide 1,382.9 03/10/2019: Magnesium 1.6 03/20/2019: ALT 40; Hemoglobin 10.7; Platelets 258 04/19/2019: BUN 50; Creatinine, Ser 1.88; Potassium 4.2; Sodium 142; TSH 55.113   Wt Readings from Last 3 Encounters:  05/02/19 198 lb (89.8 kg)  04/25/19 199 lb (90.3 kg)   04/18/19 197 lb (89.4 kg)     Other studies personally reviewed: Additional studies/ records that were reviewed today include: As above    Prior radiographs: 11/19 Personally reviewed  Single lead with atrial sensing electrodes    Device interrogation remote/PACEArt 4/20 reviewed with 99% AsVs;  Could not find heart rate histogram data    ASSESSMENT & PLAN:    Ischemic Cardiomyopathy  CHF chronic systolic  Atrial Fibrillation paroxysmal  High Risk Medication Surveillance  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  The importance of social distancing was discussed today.  Follow-up:  *51 m Next remote: As Scheduled   Current medicines are reviewed at length with the patient today.   The patient does not have concerns regarding his medicines.  The following changes were made today:  none  Labs/ tests ordered today include: CBC No orders of the defined types were placed in this encounter.   Future tests ( post COVID )     Patient Risk:  after full review of this patients clinical status, I feel that they are at moderate risk at this time.  Today, I have spent 7 minutes with the patient with telehealth technology discussing As above .    Signed, Virl Axe, MD  05/02/2019 11:16 AM     [facility] [address] [place]  [address] [phone] (office) [phone] (fax)

## 2019-05-02 NOTE — Telephone Encounter (Signed)
Called patient as he needs labs work this week. Lm on vm.

## 2019-05-03 ENCOUNTER — Other Ambulatory Visit: Payer: Self-pay

## 2019-05-03 ENCOUNTER — Ambulatory Visit (HOSPITAL_COMMUNITY)
Admission: RE | Admit: 2019-05-03 | Discharge: 2019-05-03 | Disposition: A | Discharge status: Home / Self Care | Payer: Self-pay | Source: Ambulatory Visit | Attending: Cardiology | Admitting: Cardiology

## 2019-05-03 DIAGNOSIS — I5022 Chronic systolic (congestive) heart failure: Secondary | ICD-10-CM

## 2019-05-03 DIAGNOSIS — E039 Hypothyroidism, unspecified: Secondary | ICD-10-CM

## 2019-05-03 LAB — BASIC METABOLIC PANEL
Anion gap: 12 (ref 5–15)
BUN: 31 mg/dL — ABNORMAL HIGH (ref 8–23)
CO2: 20 mmol/L — ABNORMAL LOW (ref 22–32)
Calcium: 9 mg/dL (ref 8.9–10.3)
Chloride: 107 mmol/L (ref 98–111)
Creatinine, Ser: 1.67 mg/dL — ABNORMAL HIGH (ref 0.61–1.24)
GFR calc Af Amer: 44 mL/min — ABNORMAL LOW (ref >60)
GFR calc non Af Amer: 38 mL/min — ABNORMAL LOW (ref >60)
Glucose, Bld: 84 mg/dL (ref 70–99)
Potassium: 4.6 mmol/L (ref 3.5–5.1)
Sodium: 139 mmol/L (ref 135–145)

## 2019-05-03 LAB — TSH: TSH: 42.39 u[IU]/mL — ABNORMAL HIGH (ref 0.350–4.500)

## 2019-05-03 NOTE — Telephone Encounter (Signed)
Spoke with patient, scheduled for lab appt today.

## 2019-05-04 ENCOUNTER — Telehealth (HOSPITAL_COMMUNITY): Payer: Self-pay

## 2019-05-04 ENCOUNTER — Other Ambulatory Visit (HOSPITAL_COMMUNITY): Payer: Self-pay

## 2019-05-04 DIAGNOSIS — E039 Hypothyroidism, unspecified: Secondary | ICD-10-CM

## 2019-05-04 NOTE — Telephone Encounter (Signed)
-----   Message from Dalton S McLean, MD sent at 05/04/2019  4:54 AM EDT ----- He needs to have free T3 and free T4 checked then increase Levoxyl to 125 mcg daily if he is taking 100 mcg daily.  [facility] probably would be best to refer him to endocrinology (Dr. Gherghe) for management of his thyroid problem. 

## 2019-05-04 NOTE — Telephone Encounter (Signed)
LVM regarding lab work and need for med changes, have placed Endo referral to Dr Elvera Lennox

## 2019-05-08 ENCOUNTER — Other Ambulatory Visit (HOSPITAL_COMMUNITY): Payer: Self-pay

## 2019-05-09 ENCOUNTER — Other Ambulatory Visit (HOSPITAL_COMMUNITY): Payer: Self-pay

## 2019-05-09 NOTE — Progress Notes (Signed)
Paramedicine Encounter    Patient ID: Ryan Macdonald, male    DOB: 14-Jul-1934, 85 y.o.   MRN: [id number]   Patient Care Team: [facility], [place] as PCP - General ([place]) Skeet Latch, MD as PCP - Cardiology (Cardiology) Larey Dresser, MD as PCP - Advanced Heart Failure (Cardiology)  Patient Active Problem List   Diagnosis Date Noted  . AKI (acute kidney injury) ([place]) 03/14/2019  . Spasm of muscle of lower back 03/14/2019  . Acute renal failure (ARF) ([place]) 03/09/2019  . Transaminitis   . Acute on chronic renal failure ([place]) 03/08/2019  . Acute exacerbation of CHF (congestive heart failure) ([place]) 11/23/2018  . Acute on chronic heart failure ([place]) 10/19/2018  . Pulmonary emphysema ([place])   . Chronic respiratory failure with hypoxia ([place])   . Morbid obesity ([place]) 09/09/2018  . URI (upper respiratory infection) 08/31/2018  . UTI (urinary tract infection) 08/31/2018  . Chronic systolic (congestive) heart failure ([place]) 08/28/2018  . CHF (congestive heart failure) ([place]) 08/14/2018  . Acute on chronic combined systolic and diastolic CHF (congestive heart failure) ([place]) 07/18/2018  . First degree AV block   . Shortness of breath 04/29/2018  . Acute HFrEF (heart failure with reduced ejection fraction) ([place]) 04/29/2018  . Acute respiratory failure with hypoxia ([place]) 09/27/2017  . CHF exacerbation ([place]) 09/27/2017  . Renal insufficiency   . Acute on chronic systolic CHF (congestive heart failure) ([place]) 07/08/2017  . Left knee pain 07/08/2017  . Acute bronchitis 05/10/2017  . Acute congestive heart failure ([place]) 04/14/2017  . Elevated troponin 04/14/2017  . Lactic acidosis   . Acute on chronic systolic heart failure, NYHA class 2 ([place]) 02/03/2017  . Diabetes mellitus type 2 in obese ([place]) 02/03/2017  . CAP (community acquired pneumonia) 02/03/2017  . Lobar pneumonia ([place])   . Diabetes mellitus with complication ([place])   . Acute on chronic respiratory failure with  hypoxia ([place])   . Acute on chronic combined systolic and diastolic heart failure ([place]) 05/19/2016  . Depression 05/19/2016  . Gout 05/19/2016  . 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 NSVT (nonsustained ventricular tachycardia) ([place])   . Chronic renal insufficiency, stage 3 (moderate) (HCC)   . Hyperkalemia 08/21/2015  . Pulmonary embolism ([place])   . Acute respiratory failure ([place])   . Osteoarthritis of left hip 10/25/2013  . Atrial fibrillation ([place]) 10/25/2013  . S/P hip replacement 10/24/2013  . Nonischemic cardiomyopathy ([place])   . Obesity (BMI 30-39.9)   . Essential hypertension   . Hyperlipidemia     Current Outpatient Medications:  .  allopurinol (ZYLOPRIM) 100 MG tablet, Take 200 mg by mouth daily as needed (for gout symptoms). , Disp: , Rfl:  .  apixaban (ELIQUIS) 2.5 MG TABS tablet, Take 1 tablet (2.5 mg total) by mouth 2 (two) times daily., Disp: 90 tablet, Rfl: 0 .  cyanocobalamin 1000 MCG tablet, Take 1 tablet (1,000 mcg total) by mouth daily., Disp: 90 tablet, Rfl: 0 .  diclofenac sodium (VOLTAREN) 1 % GEL, Apply 4 g topically 4 (four) times daily., Disp: 1 Tube, Rfl: 0 .  docusate sodium (COLACE) 100 MG capsule, Take 100 mg by mouth 2 (two) times daily., Disp: , Rfl:  .  finasteride (PROSCAR) 5 MG tablet, Take 1 tablet (5 mg total) by mouth daily., Disp: 30 tablet, Rfl: 0 .  glipiZIDE (GLUCOTROL) 5 MG tablet, Take 5 mg by mouth daily before breakfast., Disp: , Rfl:  .  hydrALAZINE (APRESOLINE) 25 MG tablet,  Take 1 tablet (25 mg total) by mouth 3 (three) times daily., Disp: 270 tablet, Rfl: 3 .  HYDROcodone-acetaminophen (NORCO/VICODIN) 5-325 MG tablet, Take 2 tablets by mouth every 4 (four) hours as needed., Disp: 6 tablet, Rfl: 0 .  ipratropium-albuterol (DUONEB) 0.5-2.5 (3) MG/3ML SOLN, Take 3 mLs by nebulization every 4 (four) hours as needed., Disp: 360 mL, Rfl: 0 .  isosorbide mononitrate (IMDUR) 30 MG 24 hr tablet, Take 0.5 tablets (15 mg  total) by mouth daily., Disp: 15 tablet, Rfl: 5 .  ketoconazole (NIZORAL) 2 % shampoo, Apply 1 application topically 2 (two) times a week., Disp: , Rfl:  .  levothyroxine (SYNTHROID) 100 MCG tablet, Take 1 tablet (100 mcg total) by mouth daily before breakfast., Disp: 90 tablet, Rfl: 1 .  magnesium oxide (MAG-OX) 400 MG tablet, Take 400 mg by mouth daily., Disp: , Rfl:  .  methocarbamol (ROBAXIN) 500 MG tablet, Take 1 tablet (500 mg total) by mouth every 6 (six) hours as needed for muscle spasms., Disp: 30 tablet, Rfl: 0 .  rosuvastatin (CRESTOR) 10 MG tablet, Take 1 tablet (10 mg total) by mouth daily at 6 PM., Disp: 30 tablet, Rfl: 5 .  sertraline (ZOLOFT) 25 MG tablet, Take 25 mg by mouth daily. , Disp: , Rfl:  .  spironolactone (ALDACTONE) 25 MG tablet, Take 1 tablet (25 mg total) by mouth daily., Disp: 30 tablet, Rfl: 6 .  tamsulosin (FLOMAX) 0.4 MG CAPS capsule, Take 0.4 mg by mouth at bedtime., Disp: , Rfl:  .  torsemide (DEMADEX) 20 MG tablet, Take 40 mg daily alternating with 20 mg daily, Disp: 90 tablet, Rfl: 3 .  amiodarone (PACERONE) 200 MG tablet, Take 1 tablet (200 mg total) by mouth daily. (Patient not taking: Reported on 05/09/2019), Disp: 30 tablet, Rfl: 3 .  nitroGLYCERIN (NITROSTAT) 0.4 MG SL tablet, Place 1 tablet (0.4 mg total) under the tongue every 5 (five) minutes x 3 doses as needed for chest pain. (Patient not taking: Reported on 05/09/2019), Disp: 25 tablet, Rfl: 0 Allergies  Allergen Reactions  . Lisinopril Swelling    Facial swelling  . Xarelto [Rivaroxaban] Other (See Comments)    "Blood came out of my penis"      Social History   Socioeconomic History  . Marital status: Married    Spouse name: Not on file  . Number of children: Not on file  . Years of education: Not on file  . Highest education level: Not on file  Occupational History  . Occupation: Retired    Comment: Former Patent examiner  . Financial resource strain: Not on file  . Food  insecurity:    Worry: Not on file    Inability: Not on file  . Transportation needs:    Medical: Not on file    Non-medical: Not on file  Tobacco Use  . Smoking status: Former Smoker    Packs/day: 1.00    Years: 15.00    Pack years: 15.00    Types: Cigarettes, Cigars  . Smokeless tobacco: Former Neurosurgeon  . Tobacco comment: quit smoking ~ 50 yr ago  Substance and Sexual Activity  . Alcohol use: No    Alcohol/week: 2.0 standard drinks    Types: 2 Cans of beer per week    Comment: rare beer.  . Drug use: No  . Sexual activity: Yes  Lifestyle  . Physical activity:    Days per week: Not on file    Minutes per session: Not on  file  . Stress: Not on file  Relationships  . Social connections:    Talks on phone: Not on file    Gets together: Not on file    Attends religious service: Not on file    Active member of club or organization: Not on file    Attends meetings of clubs or organizations: Not on file    Relationship status: Not on file  . Intimate partner violence:    Fear of current or ex partner: No    Emotionally abused: No    Physically abused: No    Forced sexual activity: No  Other Topics Concern  . Not on file  Social History Narrative   [place] in [place]. Moved from [place] (2013). Does not routinely exercise.    Physical Exam      Future Appointments  Date Time Provider Department Center  05/28/2019  2:20 PM Laurey MoraleMcLean, Dalton S, MD [facility] None  06/13/2019 11:00 AM [facility] LAB [facility] None  06/21/2019  7:20 AM [facility] DEVICE REMOTES [facility]    BP 122/60   Pulse 68   Temp (!) 97.2 F (36.2 C)   Resp 16   Wt 198 lb (89.8 kg)   SpO2 98%   BMI 31.01 kg/m   Weight yesterday-198 Last visit weight-199  Pt had to go to urgent care for an injury to the rt knee. It is still painful when he stands on it. No dislocation noted.  I seen a referral was sent to endo--he has not heard from them yet. Will f/u.  He has not taken his  morning meds yet today--it is now 1340.   He denies sob, no dizziness, no h/a, no c/p, no complaints.  Med verified and 2 pill boxes refilled.  He does have swelling to his rt foot--none to the left foot. He states it always swells more than the left.   Kerry HoughKatie Tally Mckinnon, EMT-Paramedic [phone] [facility]  05/09/19

## 2019-05-10 ENCOUNTER — Telehealth (HOSPITAL_COMMUNITY): Payer: Self-pay | Admitting: Licensed Clinical Social Worker

## 2019-05-10 NOTE — Telephone Encounter (Signed)
Community paramedic requested CSW follow up regarding patient endocrinology referral.  CSW called [facility] and confirmed that they have patient referral and will likely be following up with patient today regarding initial appt.  CSW will continue to follow and assist as needed  Jorge Ny, [facility] Desk#: [phone] Cell#: [phone]

## 2019-05-14 NOTE — Telephone Encounter (Signed)
LM for patient to return call.

## 2019-05-16 MED ORDER — LEVOTHYROXINE SODIUM 125 MCG PO TABS: 125.0000 ug | ORAL_TABLET | Freq: Every day | ORAL | 1 refills | Status: DC

## 2019-05-16 NOTE — Telephone Encounter (Signed)
-----   Message from Larey Dresser, MD sent at 05/04/2019  4:54 AM EDT ----- He needs to have free T3 and free T4 checked then increase Levoxyl to 125 mcg daily if he is taking 100 mcg daily.  I think his PCP is at the [facility], probably would be best to refer him to endocrinology (Dr. Cruzita Lederer) for management of his thyroid problem.

## 2019-05-16 NOTE — Telephone Encounter (Signed)
Spoke with patient, he will have labs done tomorrow in office. Rx sent to [facility] for levoxyl 122mcg. Pt reports understanding. Pt has appt with Dr Cruzita Lederer office on 6/30. Rx faxed to [facility] 1 clinic.

## 2019-05-17 ENCOUNTER — Other Ambulatory Visit: Payer: Self-pay

## 2019-05-17 ENCOUNTER — Ambulatory Visit (HOSPITAL_COMMUNITY)
Admission: RE | Admit: 2019-05-17 | Discharge: 2019-05-17 | Disposition: A | Discharge status: Home / Self Care | Payer: Self-pay | Source: Ambulatory Visit | Attending: Internal Medicine | Admitting: Internal Medicine

## 2019-05-17 DIAGNOSIS — E039 Hypothyroidism, unspecified: Secondary | ICD-10-CM | POA: Insufficient documentation

## 2019-05-17 LAB — T4, FREE: Free T4: 0.68 ng/dL (ref 0.61–1.12)

## 2019-05-18 LAB — T3, FREE: T3, Free: 2.1 pg/mL (ref 2.0–4.4)

## 2019-05-23 ENCOUNTER — Other Ambulatory Visit (HOSPITAL_COMMUNITY): Payer: Self-pay

## 2019-05-23 NOTE — Progress Notes (Signed)
Paramedicine Encounter    Patient ID: Ryan Macdonald, male    DOB: Jul 05, 1934, 85 y.o.   MRN: [id number]   Patient Care Team: [facility], [place] as PCP - General ([place]) Skeet Latch, MD as PCP - Cardiology (Cardiology) Larey Dresser, MD as PCP - Advanced Heart Failure (Cardiology)  Patient Active Problem List   Diagnosis Date Noted  . AKI (acute kidney injury) ([place]) 03/14/2019  . Spasm of muscle of lower back 03/14/2019  . Acute renal failure (ARF) ([place]) 03/09/2019  . Transaminitis   . Acute on chronic renal failure ([place]) 03/08/2019  . Acute exacerbation of CHF (congestive heart failure) ([place]) 11/23/2018  . Acute on chronic heart failure ([place]) 10/19/2018  . Pulmonary emphysema ([place])   . Chronic respiratory failure with hypoxia ([place])   . Morbid obesity ([place]) 09/09/2018  . URI (upper respiratory infection) 08/31/2018  . UTI (urinary tract infection) 08/31/2018  . Chronic systolic (congestive) heart failure ([place]) 08/28/2018  . CHF (congestive heart failure) ([place]) 08/14/2018  . Acute on chronic combined systolic and diastolic CHF (congestive heart failure) ([place]) 07/18/2018  . First degree AV block   . Shortness of breath 04/29/2018  . Acute HFrEF (heart failure with reduced ejection fraction) ([place]) 04/29/2018  . Acute respiratory failure with hypoxia ([place]) 09/27/2017  . CHF exacerbation ([place]) 09/27/2017  . Renal insufficiency   . Acute on chronic systolic CHF (congestive heart failure) ([place]) 07/08/2017  . Left knee pain 07/08/2017  . Acute bronchitis 05/10/2017  . Acute congestive heart failure ([place]) 04/14/2017  . Elevated troponin 04/14/2017  . Lactic acidosis   . Acute on chronic systolic heart failure, NYHA class 2 ([place]) 02/03/2017  . Diabetes mellitus type 2 in obese ([place]) 02/03/2017  . CAP (community acquired pneumonia) 02/03/2017  . Lobar pneumonia ([place])   . Diabetes mellitus with complication ([place])   . Acute on chronic respiratory failure with  hypoxia ([place])   . Acute on chronic combined systolic and diastolic heart failure ([place]) 05/19/2016  . Depression 05/19/2016  . Gout 05/19/2016  . 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 NSVT (nonsustained ventricular tachycardia) ([place])   . Chronic renal insufficiency, stage 3 (moderate) (HCC)   . Hyperkalemia 08/21/2015  . Pulmonary embolism ([place])   . Acute respiratory failure ([place])   . Osteoarthritis of left hip 10/25/2013  . Atrial fibrillation ([employer]) 10/25/2013  . S/P hip replacement 10/24/2013  . Nonischemic cardiomyopathy ([place])   . Obesity (BMI 30-39.9)   . Essential hypertension   . Hyperlipidemia     Current Outpatient Medications:  .  allopurinol (ZYLOPRIM) 100 MG tablet, Take 200 mg by mouth daily as needed (for gout symptoms). , Disp: , Rfl:  .  apixaban (ELIQUIS) 2.5 MG TABS tablet, Take 1 tablet (2.5 mg total) by mouth 2 (two) times daily., Disp: 90 tablet, Rfl: 0 .  cyanocobalamin 1000 MCG tablet, Take 1 tablet (1,000 mcg total) by mouth daily., Disp: 90 tablet, Rfl: 0 .  diclofenac sodium (VOLTAREN) 1 % GEL, Apply 4 g topically 4 (four) times daily., Disp: 1 Tube, Rfl: 0 .  docusate sodium (COLACE) 100 MG capsule, Take 100 mg by mouth 2 (two) times daily., Disp: , Rfl:  .  finasteride (PROSCAR) 5 MG tablet, Take 1 tablet (5 mg total) by mouth daily., Disp: 30 tablet, Rfl: 0 .  glipiZIDE (GLUCOTROL) 5 MG tablet, Take 5 mg by mouth daily before breakfast., Disp: , Rfl:  .  hydrALAZINE (APRESOLINE) 25 MG tablet,  Take 1 tablet (25 mg total) by mouth 3 (three) times daily., Disp: 270 tablet, Rfl: 3 .  HYDROcodone-acetaminophen (NORCO/VICODIN) 5-325 MG tablet, Take 2 tablets by mouth every 4 (four) hours as needed., Disp: 6 tablet, Rfl: 0 .  ipratropium-albuterol (DUONEB) 0.5-2.5 (3) MG/3ML SOLN, Take 3 mLs by nebulization every 4 (four) hours as needed., Disp: 360 mL, Rfl: 0 .  isosorbide mononitrate (IMDUR) 30 MG 24 hr tablet, Take 0.5 tablets (15 mg  total) by mouth daily., Disp: 15 tablet, Rfl: 5 .  ketoconazole (NIZORAL) 2 % shampoo, Apply 1 application topically 2 (two) times a week., Disp: , Rfl:  .  levothyroxine (SYNTHROID) 125 MCG tablet, Take 1 tablet (125 mcg total) by mouth daily before breakfast. (Patient taking differently: Take 125 mcg by mouth daily before breakfast. ), Disp: 90 tablet, Rfl: 1 .  magnesium oxide (MAG-OX) 400 MG tablet, Take 400 mg by mouth daily., Disp: , Rfl:  .  methocarbamol (ROBAXIN) 500 MG tablet, Take 1 tablet (500 mg total) by mouth every 6 (six) hours as needed for muscle spasms., Disp: 30 tablet, Rfl: 0 .  rosuvastatin (CRESTOR) 10 MG tablet, Take 1 tablet (10 mg total) by mouth daily at 6 PM., Disp: 30 tablet, Rfl: 5 .  sertraline (ZOLOFT) 25 MG tablet, Take 25 mg by mouth daily. , Disp: , Rfl:  .  spironolactone (ALDACTONE) 25 MG tablet, Take 1 tablet (25 mg total) by mouth daily., Disp: 30 tablet, Rfl: 6 .  tamsulosin (FLOMAX) 0.4 MG CAPS capsule, Take 0.4 mg by mouth at bedtime., Disp: , Rfl:  .  torsemide (DEMADEX) 20 MG tablet, Take 40 mg daily alternating with 20 mg daily, Disp: 90 tablet, Rfl: 3 .  amiodarone (PACERONE) 200 MG tablet, Take 1 tablet (200 mg total) by mouth daily. (Patient not taking: Reported on 05/09/2019), Disp: 30 tablet, Rfl: 3 .  nitroGLYCERIN (NITROSTAT) 0.4 MG SL tablet, Place 1 tablet (0.4 mg total) under the tongue every 5 (five) minutes x 3 doses as needed for chest pain. (Patient not taking: Reported on 05/09/2019), Disp: 25 tablet, Rfl: 0 Allergies  Allergen Reactions  . Lisinopril Swelling    Facial swelling  . Xarelto [Rivaroxaban] Other (See Comments)    "Blood came out of my penis"      Social History   Socioeconomic History  . Marital status: Married    Spouse name: Not on file  . Number of children: Not on file  . Years of education: Not on file  . Highest education level: Not on file  Occupational History  . Occupation: Retired    Comment: Former Patent examiner  . Financial resource strain: Not on file  . Food insecurity    Worry: Not on file    Inability: Not on file  . Transportation needs    Medical: Not on file    Non-medical: Not on file  Tobacco Use  . Smoking status: Former Smoker    Packs/day: 1.00    Years: 15.00    Pack years: 15.00    Types: Cigarettes, Cigars  . Smokeless tobacco: Former Neurosurgeon  . Tobacco comment: quit smoking ~ 50 yr ago  Substance and Sexual Activity  . Alcohol use: No    Alcohol/week: 2.0 standard drinks    Types: 2 Cans of beer per week    Comment: rare beer.  . Drug use: No  . Sexual activity: Yes  Lifestyle  . Physical activity    Days per  week: Not on file    Minutes per session: Not on file  . Stress: Not on file  Relationships  . Social Musicianconnections    Talks on phone: Not on file    Gets together: Not on file    Attends religious service: Not on file    Active member of club or organization: Not on file    Attends meetings of clubs or organizations: Not on file    Relationship status: Not on file  . Intimate partner violence    Fear of current or ex partner: No    Emotionally abused: No    Physically abused: No    Forced sexual activity: No  Other Topics Concern  . Not on file  Social History Narrative   [place] in [place]. Moved from [place] (2013). Does not routinely exercise.    Physical Exam      Future Appointments  Date Time Provider Department Center  05/28/2019  2:20 PM Laurey MoraleMcLean, Dalton S, MD [facility] None  05/29/2019  2:15 PM Carlus PavlovGherghe, Cristina, MD [facility] None  06/13/2019 11:00 AM [facility] LAB [facility] None  06/21/2019  7:20 AM [facility] DEVICE REMOTES [facility]    BP (!) 100/0 Comment: palpated  Pulse 82   Temp (!) 97 F (36.1 C)   Resp 16   Wt 194 lb (88 kg)   SpO2 98%   BMI 30.38 kg/m   B/P standing-106/systolic  CBG EMS-58  Weight yesterday-194 Last visit weight-198  Pt reports he is feeling good, he has f/u at  clinic next week and then the endo doc the next day. He had labs done last week and his levothyroxine was increased to 125mcg. He just started the 125mg  today.  He denied sob, no dizziness, no c/p. No edema noted.  His b/p lower than usual--he denies any dizziness, his b/p increased to 106 from 100 when standing. He did not eat breakfast this morning with his pills like he normally does, his CBG is 58.  He has no sweating, no signs of his CBG dropping. His wife fixed him some food to eat. I suspect his bp is lower than usual due to him not eating. I advised him to call me before he takes his mid dose hydralazine and check his b/p using his home machine and let me know what it is.   Kerry HoughKatie Cassiopeia Florentino, EMT-Paramedic [phone] [facility] Par[facility]  05/23/19

## 2019-05-25 ENCOUNTER — Other Ambulatory Visit: Payer: Self-pay

## 2019-05-28 ENCOUNTER — Ambulatory Visit (HOSPITAL_COMMUNITY)
Admission: RE | Admit: 2019-05-28 | Discharge: 2019-05-28 | Disposition: A | Discharge status: Home / Self Care | Payer: Medicare Other | Source: Ambulatory Visit | Attending: Cardiology | Admitting: Cardiology

## 2019-05-28 ENCOUNTER — Other Ambulatory Visit: Payer: Self-pay

## 2019-05-28 ENCOUNTER — Other Ambulatory Visit (HOSPITAL_COMMUNITY): Payer: Self-pay

## 2019-05-28 DIAGNOSIS — I428 Other cardiomyopathies: Secondary | ICD-10-CM | POA: Diagnosis not present

## 2019-05-28 DIAGNOSIS — I5022 Chronic systolic (congestive) heart failure: Secondary | ICD-10-CM

## 2019-05-28 MED ORDER — ISOSORBIDE MONONITRATE ER 30 MG PO TB24: 30.0000 mg | ORAL_TABLET | Freq: Every day | ORAL | 3 refills | Status: DC

## 2019-05-28 MED ORDER — HYDRALAZINE HCL 25 MG PO TABS: 37.5000 mg | ORAL_TABLET | Freq: Three times a day (TID) | ORAL | 3 refills | Status: DC

## 2019-05-28 NOTE — Progress Notes (Signed)
Paramedicine Encounter    Patient ID: Ryan CoonFred Macdonald, male    DOB: [birth year], 85 y.o.   MRN: [id number]   [facility] Care Team: [facility] - [facility] (General Practice) Chilton Siandolph, Tiffany, MD as PCP - Cardiology (Cardiology) Laurey MoraleMcLean, Dalton S, MD as PCP - Advanced Heart Failure (Cardiology)  Patient Active Problem List   Diagnosis Date Noted  . AKI (acute kidney injury) (HCC) 03/14/2019  . Spasm of muscle of lower back 03/14/2019  . Acute renal failure (ARF) (HCC) 03/09/2019  . Transaminitis   . Acute on chronic renal failure (HCC) 03/08/2019  . Acute exacerbation of CHF (congestive heart failure) (HCC) 11/23/2018  . Acute on chronic heart failure (HCC) 10/19/2018  . Pulmonary emphysema (HCC)   . Chronic respiratory failure with hypoxia (HCC)   . Morbid obesity (HCC) 09/09/2018  . URI (upper respiratory infection) 08/31/2018  . UTI (urinary tract infection) 08/31/2018  . Chronic systolic (congestive) heart failure (HCC) 08/28/2018  . CHF (congestive heart failure) (HCC) 08/14/2018  . Acute on chronic combined systolic and diastolic CHF (congestive heart failure) (HCC) 07/18/2018  . First degree AV block   . Shortness of breath 04/29/2018  . Acute HFrEF (heart failure with reduced ejection fraction) (HCC) 04/29/2018  . Acute respiratory failure with hypoxia (HCC) 09/27/2017  . CHF exacerbation (HCC) 09/27/2017  . Renal insufficiency   . Acute on chronic systolic CHF (congestive heart failure) (HCC) 07/08/2017  . Left knee pain 07/08/2017  . Acute bronchitis 05/10/2017  . Acute congestive heart failure (HCC) 04/14/2017  . Elevated troponin 04/14/2017  . Lactic acidosis   . Acute on chronic systolic heart failure, NYHA class 2 (HCC) 02/03/2017  . Diabetes mellitus type 2 in obese (HCC) 02/03/2017  . CAP (community acquired pneumonia) 02/03/2017  . Lobar pneumonia (HCC)   . Diabetes mellitus with complication (HCC)   . Acute on chronic respiratory failure with  hypoxia (HCC)   . Acute on chronic combined systolic and diastolic heart failure (HCC) 05/19/2016  . Depression 05/19/2016  . Gout 05/19/2016  . 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 NSVT (nonsustained ventricular tachycardia) (HCC)   . Chronic renal insufficiency, stage 3 (moderate) (HCC)   . Hyperkalemia 08/21/2015  . Pulmonary embolism (HCC)   . Acute respiratory failure (HCC)   . Osteoarthritis of left hip 10/25/2013  . Atrial fibrillation (HCC) 10/25/2013  . S/P hip replacement 10/24/2013  . Nonischemic cardiomyopathy (HCC)   . Obesity (BMI 30-39.9)   . Essential hypertension   . Hyperlipidemia     Current Outpatient Medications:  .  allopurinol (ZYLOPRIM) 100 MG tablet, Take 200 mg by mouth daily as needed (for gout symptoms). , Disp: , Rfl:  .  apixaban (ELIQUIS) 2.5 MG TABS tablet, Take 1 tablet (2.5 mg total) by mouth 2 (two) times daily., Disp: 90 tablet, Rfl: 0 .  cyanocobalamin 1000 MCG tablet, Take 1 tablet (1,000 mcg total) by mouth daily., Disp: 90 tablet, Rfl: 0 .  diclofenac sodium (VOLTAREN) 1 % GEL, Apply 4 g topically 4 (four) times daily., Disp: 1 Tube, Rfl: 0 .  docusate sodium (COLACE) 100 MG capsule, Take 100 mg by mouth 2 (two) times daily., Disp: , Rfl:  .  finasteride (PROSCAR) 5 MG tablet, Take 1 tablet (5 mg total) by mouth daily., Disp: 30 tablet, Rfl: 0 .  glipiZIDE (GLUCOTROL) 5 MG tablet, Take 5 mg by mouth daily before breakfast., Disp: , Rfl:  .  HYDROcodone-acetaminophen (NORCO/VICODIN) 5-325 MG tablet,  Take 2 tablets by mouth every 4 (four) hours as needed., Disp: 6 tablet, Rfl: 0 .  ipratropium-albuterol (DUONEB) 0.5-2.5 (3) MG/3ML SOLN, Take 3 mLs by nebulization every 4 (four) hours as needed., Disp: 360 mL, Rfl: 0 .  ketoconazole (NIZORAL) 2 % shampoo, Apply 1 application topically 2 (two) times a week., Disp: , Rfl:  .  levothyroxine (SYNTHROID) 125 MCG tablet, Take 1 tablet (125 mcg total) by mouth daily before  breakfast. (Patient taking differently: Take 125 mcg by mouth daily before breakfast. ), Disp: 90 tablet, Rfl: 1 .  magnesium oxide (MAG-OX) 400 MG tablet, Take 400 mg by mouth daily., Disp: , Rfl:  .  methocarbamol (ROBAXIN) 500 MG tablet, Take 1 tablet (500 mg total) by mouth every 6 (six) hours as needed for muscle spasms., Disp: 30 tablet, Rfl: 0 .  rosuvastatin (CRESTOR) 10 MG tablet, Take 1 tablet (10 mg total) by mouth daily at 6 PM., Disp: 30 tablet, Rfl: 5 .  sertraline (ZOLOFT) 25 MG tablet, Take 25 mg by mouth daily. , Disp: , Rfl:  .  spironolactone (ALDACTONE) 25 MG tablet, Take 1 tablet (25 mg total) by mouth daily., Disp: 30 tablet, Rfl: 6 .  tamsulosin (FLOMAX) 0.4 MG CAPS capsule, Take 0.4 mg by mouth at bedtime., Disp: , Rfl:  .  torsemide (DEMADEX) 20 MG tablet, Take 40 mg daily alternating with 20 mg daily, Disp: 90 tablet, Rfl: 3 .  amiodarone (PACERONE) 200 MG tablet, Take 1 tablet (200 mg total) by mouth daily. (Patient not taking: Reported on 05/09/2019), Disp: 30 tablet, Rfl: 3 .  hydrALAZINE (APRESOLINE) 25 MG tablet, Take 1.5 tablets (37.5 mg total) by mouth 3 (three) times daily., Disp: 405 tablet, Rfl: 3 .  isosorbide mononitrate (IMDUR) 30 MG 24 hr tablet, Take 1 tablet (30 mg total) by mouth daily., Disp: 90 tablet, Rfl: 3 .  nitroGLYCERIN (NITROSTAT) 0.4 MG SL tablet, Place 1 tablet (0.4 mg total) under the tongue every 5 (five) minutes x 3 doses as needed for chest pain. (Patient not taking: Reported on 05/09/2019), Disp: 25 tablet, Rfl: 0 Allergies  Allergen Reactions  . Lisinopril Swelling    Facial swelling  . Xarelto [Rivaroxaban] Other (See Comments)    "Blood came out of my penis"      Social History   Socioeconomic History  . Marital status: Married    Spouse name: Not on file  . Number of children: Not on file  . Years of education: Not on file  . Highest education level: Not on file  Occupational History  . Occupation: Retired    Comment: Former  Patent examiner  . Financial resource strain: Not on file  . Food insecurity    Worry: Not on file    Inability: Not on file  . Transportation needs    Medical: Not on file    Non-medical: Not on file  Tobacco Use  . Smoking status: Former Smoker    Packs/day: 1.00    Years: 15.00    Pack years: 15.00    Types: Cigarettes, Cigars  . Smokeless tobacco: Former Neurosurgeon  . Tobacco comment: quit smoking ~ 50 yr ago  Substance and Sexual Activity  . Alcohol use: No    Alcohol/week: 2.0 standard drinks    Types: 2 Cans of beer per week    Comment: rare beer.  . Drug use: No  . Sexual activity: Yes  Lifestyle  . Physical activity    Days per  week: Not on file    Minutes per session: Not on file  . Stress: Not on file  Relationships  . Social Herbalist on phone: Not on file    Gets together: Not on file    Attends religious service: Not on file    Active member of club or organization: Not on file    Attends meetings of clubs or organizations: Not on file    Relationship status: Not on file  . Intimate partner violence    Fear of current or ex partner: No    Emotionally abused: No    Physically abused: No    Forced sexual activity: No  Other Topics Concern  . Not on file  Social History Narrative   Lives in [place] with wife. Moved from [place] (2013). Does not routinely exercise.    Physical Exam      Future Appointments  Date Time Provider [place]  05/29/2019  2:15 PM Philemon Kingdom, MD [facility] None  06/13/2019 11:00 AM [facility] None  06/21/2019  7:20 AM [facility] DEVICE REMOTES [facility]    BP 124/64   Pulse 72   Temp 97.9 F (36.6 C)   Resp 16   Wt 197 lb (89.4 kg)   SpO2 97%   BMI 30.85 kg/m   Weight yesterday-198 Last visit weight-194  Pt reports he is doing good. He denies sob, no c/p.  He denies any missed doses, he did take his meds today.  He does have pitting edema to his legs.   Virtual visit done with dr Vassie Moment has increased his hydralazine to 37.5mg  TID. Pill boxes filled--filled 3 of them.  His isosorbide was also increased to 30mg  daily--will have to wait until he gets his new rx from [place] to start that isosorbide.  Pt ordered some refills on his meds to [place].  [facility]--I called clinic to see if they can resend it-no answer but will try back.   Marylouise Stacks, [place] [facility] Paramedic  05/29/19

## 2019-05-28 NOTE — Progress Notes (Signed)
Spoke with patient, aware of med changes. Has lab appt on 7/15 11am.  Rx for new prescriptions to be faxed to [facility] [phone] attn: dr Renelda Mom when signed by Dr Aundra Dubin.   AVS mailed to patient. Verbalized understanding.

## 2019-05-28 NOTE — Patient Instructions (Addendum)
INCREASE Hydralazine to 37.5 mg (1.5 tab) three times a day  INCREASE Imdur to 30mg  (1 tab) daily  Labs in 2 weeks: 06/13/19 11am We will only contact you if something comes back abnormal or we need to make some changes. Otherwise no news is good news!  Your physician recommends that you schedule a follow-up appointment in: 2 months with Dr. Aundra Dubin.  You will be called to schedule this appointment.   At the [facility], you and your health needs are our priority. As part of our continuing mission to provide you with exceptional heart care, we have created designated Provider Care Teams. These Care Teams include your primary Cardiologist (physician) and Advanced Practice Providers (APPs- Physician Assistants and Nurse Practitioners) who all work together to provide you with the care you need, when you need it.   You may see any of the following providers on your designated Care Team at your next follow up: Marland Kitchen Dr Glori Bickers . Dr Loralie Champagne . Darrick Grinder, NP

## 2019-05-28 NOTE — Progress Notes (Signed)
rx faxed to pharmacy

## 2019-05-29 ENCOUNTER — Encounter: Payer: Self-pay | Admitting: Internal Medicine

## 2019-05-29 ENCOUNTER — Ambulatory Visit (INDEPENDENT_AMBULATORY_CARE_PROVIDER_SITE_OTHER): Payer: No Typology Code available for payment source | Admitting: Internal Medicine

## 2019-05-29 ENCOUNTER — Other Ambulatory Visit: Payer: Self-pay

## 2019-05-29 VITALS — BP 130/80 | HR 85 | Ht 67.0 in | Wt 197.0 lb

## 2019-05-29 DIAGNOSIS — E032 Hypothyroidism due to medicaments and other exogenous substances: Secondary | ICD-10-CM

## 2019-05-29 NOTE — Patient Instructions (Addendum)
Please continue Levothyroxine 125 mcg daily.  Take the thyroid hormone every day, with water, at least 30 minutes before breakfast, separated by at least 4 hours from: - acid reflux medications - calcium - iron - multivitamins  Please put the levothyroxine on the nightstand.  Please stop at the lab.  Please come back for a follow-up appointment in 4 months.   Hypothyroidism  Hypothyroidism is when the thyroid gland does not make enough of certain hormones (it is underactive). The thyroid gland is a small gland located in the lower front part of the neck, just in front of the windpipe (trachea). This gland makes hormones that help control how the body uses food for energy (metabolism) as well as how the heart and brain function. These hormones also play a role in keeping your bones strong. When the thyroid is underactive, it produces too little of the hormones thyroxine (T4) and triiodothyronine (T3). What are the causes? This condition may be caused by:  Hashimoto's disease. This is a disease in which the body's disease-fighting system (immune system) attacks the thyroid gland. This is the most common cause.  Viral infections.  Pregnancy.  Certain medicines.  Birth defects.  Past radiation treatments to the head or neck for cancer.  Past treatment with radioactive iodine.  Past exposure to radiation in the environment.  Past surgical removal of part or all of the thyroid.  Problems with a gland in the center of the brain (pituitary gland).  Lack of enough iodine in the diet. What increases the risk? You are more likely to develop this condition if:  You are male.  You have a family history of thyroid conditions.  You use a medicine called lithium.  You take medicines that affect the immune system (immunosuppressants). What are the signs or symptoms? Symptoms of this condition include:  Feeling as though you have no energy (lethargy).  Not being able to  tolerate cold.  Weight gain that is not explained by a change in diet or exercise habits.  Lack of appetite.  Dry skin.  Coarse hair.  Menstrual irregularity.  Slowing of thought processes.  Constipation.  Sadness or depression. How is this diagnosed? This condition may be diagnosed based on:  Your symptoms, your medical history, and a physical exam.  Blood tests. You may also have imaging tests, such as an ultrasound or MRI. How is this treated? This condition is treated with medicine that replaces the thyroid hormones that your body does not make. After you begin treatment, it may take several weeks for symptoms to go away. Follow these instructions at home:  Take over-the-counter and prescription medicines only as told by your health care provider.  If you start taking any new medicines, tell your health care provider.  Keep all follow-up visits as told by your health care provider. This is important. ? As your condition improves, your dosage of thyroid hormone medicine may change. ? You will need to have blood tests regularly so that your health care provider can monitor your condition. Contact a health care provider if:  Your symptoms do not get better with treatment.  You are taking thyroid replacement medicine and you: ? Sweat a lot. ? Have tremors. ? Feel anxious. ? Lose weight rapidly. ? Cannot tolerate heat. ? Have emotional swings. ? Have diarrhea. ? Feel weak. Get help right away if you have:  Chest pain.  An irregular heartbeat.  A rapid heartbeat.  Difficulty breathing. Summary  Hypothyroidism is when the  thyroid gland does not make enough of certain hormones (it is underactive).  When the thyroid is underactive, it produces too little of the hormones thyroxine (T4) and triiodothyronine (T3).  The most common cause is Hashimoto's disease, a disease in which the body's disease-fighting system (immune system) attacks the thyroid gland. The  condition can also be caused by viral infections, medicine, pregnancy, or past radiation treatment to the head or neck.  Symptoms may include weight gain, dry skin, constipation, feeling as though you do not have energy, and not being able to tolerate cold.  This condition is treated with medicine to replace the thyroid hormones that your body does not make. This information is not intended to replace advice given to you by your health care provider. Make sure you discuss any questions you have with your health care provider. Document Released: 11/15/2005 Document Revised: 10/28/2017 Document Reviewed: 10/26/2017 Elsevier Patient Education  2020 [employer].

## 2019-05-29 NOTE — Progress Notes (Addendum)
Patient ID: Ryan Macdonald, male   DOB: Apr 07, 1934, 85 y.o.   MRN: [id number]   HPI  Ryan Macdonald is a 85 y.o.-year-old male, referred by his cardiologist, Dr. Shirlee LatchMcLean, for management of hypothyroidism.  He is seen in the cardiology clinic for nonischemic cardiomyopathy with CHF.  He is a poor historian and it was difficult to trace back to diagnosis of his hypothyroidism. Reviewing his chart, his first TSH appears to have been elevated in 06/2017.  Per review of the chart, I do not think that he was on amiodarone at that time.  It is possible that amiodarone started afterwards worsened his hypothyroidism.  He tells me that the medication was briefly stopped but he restarted on it recently.  He was started on levothyroxine I believe in 02/2019 and dose was changed earlier this month, to 125 mcg daily.  He takes the thyroid hormone: - fasting - with water - separated by >30 min from b'fast  - + calcium later in the day - no iron, PPIs, multivitamins   I reviewed pt's thyroid tests: Lab Results  Component Value Date   TSH 42.390 (H) 05/03/2019   TSH 55.113 (H) 04/19/2019   TSH 82.403 (H) 03/20/2019   TSH 18.521 (H) 11/24/2018   TSH 6.935 (H) 07/08/2017   TSH 1.508 05/19/2016   FREET4 0.68 05/17/2019   FREET4 0.85 07/09/2017   T3FREE 2.1 05/17/2019   Antithyroid antibodies: No results found for: THGAB No components found for: TPOAB  Pt denies: - weight gain; he actually had intentional weight loss - fatigue - cold intolerance - depression - constipation  Pt denies feeling nodules in neck, hoarseness, dysphagia/odynophagia, SOB with lying down.  She hasno FH of thyroid disorders. No FH of thyroid cancer.  No h/o radiation tx to head or neck. No recent use of iodine supplements.  ROS: Constitutional: no weight gain/+ weight loss - 70 lbs in last year (intentional), no fatigue, no subjective hyperthermia/hypothermia Eyes: no blurry vision, no xerophthalmia ENT: no sore  throat, no nodules palpated in throat, no dysphagia/odynophagia, no hoarseness Cardiovascular: no CP/SOB/palpitations/+ occasional leg swelling Respiratory: + cough/no SOB Gastrointestinal: no N/V/D/C Musculoskeletal: no muscle/joint aches Skin: no rashes Neurological: no tremors/numbness/tingling/dizziness Psychiatric: no depression/anxiety  Past Medical History:  Diagnosis Date  . AKI (acute kidney injury) (HCC) 02/2019  . Atrial fibrillation (HCC)   . Cardiomyopathy (HCC)   . Cat scratch fever    removed mass on right side neck  . Cataracts, bilateral   . CHF (congestive heart failure) (HCC)   . Chronic kidney disease   . Corns and callosities   . Diabetes mellitus without complication (HCC)    TYPE 2  . Dyspnea   . Erythema intertrigo   . Flat foot   . Gout   . High cholesterol   . Hx of urethral stricture   . Hypertension   . Kidney stones   . Morbid obesity (HCC)   . Nonischemic cardiomyopathy (HCC)    a. ? 2009 Cath in [place] - nl cors per pt;  b. 09/2013 Echo: EF 25-30%, sev diff HK.  . Obesity   . Osteoarthritis   . PAF (paroxysmal atrial fibrillation) (HCC)    a. post-op hip in 2014 - prev on xarelto.  . Renal insufficiency    Past Surgical History:  Procedure Laterality Date  . CARDIAC CATHETERIZATION  1980's  . CIRCUMCISION  1940's  . CORONARY STENT INTERVENTION N/A 08/18/2018   Procedure: CORONARY STENT INTERVENTION;  Surgeon: Ryan MussVaranasi, Jayadeep  S, MD;  Location: [facility];  Service: Cardiovascular;  Laterality: N/A;  . CYSTOSCOPY WITH URETHRAL DILATATION  10/23/2013  . CYSTOSCOPY WITH URETHRAL DILATATION N/A 10/23/2013   Procedure: CYSTOSCOPY WITH URETHRAL DILATATION;  Surgeon: Ailene Rud, MD;  Location: [facility];  Service: Urology;  Laterality: N/A;  . INCISION AND DRAINAGE OF WOUND Right 1980's   "cat scratch" (10/23/2013)  . INGUINAL HERNIA REPAIR Right 1950's  . INTRAVASCULAR ULTRASOUND/IVUS N/A 08/18/2018   Procedure: Intravascular  Ultrasound/IVUS;  Surgeon: Jettie Booze, MD;  Location: [facility];  Service: Cardiovascular;  Laterality: N/A;  . KIDNEY STONE SURGERY Right 1970's; 1983   "twice"  . LEFT HEART CATH AND CORONARY ANGIOGRAPHY N/A 08/18/2018   Procedure: LEFT HEART CATH AND CORONARY ANGIOGRAPHY;  Surgeon: Jettie Booze, MD;  Location: [facility];  Service: Cardiovascular;  Laterality: N/A;  . TONSILLECTOMY AND ADENOIDECTOMY  1940's  . TOTAL HIP ARTHROPLASTY Left 10/23/2013   Procedure: TOTAL HIP ARTHROPLASTY;  Surgeon: Garald Balding, MD;  Location: [facility];  Service: Orthopedics;  Laterality: Left;   Social History   Socioeconomic History  . Marital status: Married    Spouse name: Not on file  . Number of children: Not on file  . Years of education: Not on file  . Highest education level: Not on file  Occupational History  . Occupation: Retired    Comment: Former Education officer, museum  . Financial resource strain: Not on file  . Food insecurity    Worry: Not on file    Inability: Not on file  . Transportation needs    Medical: Not on file    Non-medical: Not on file  Tobacco Use  . Smoking status: Former Smoker    Packs/day: 1.00    Years: 15.00    Pack years: 15.00    Types: Cigarettes, Cigars  . Smokeless tobacco: Former Systems developer  . Tobacco comment: quit smoking ~ 50 yr ago  Substance and Sexual Activity  . Alcohol use: No    Alcohol/week: 2.0 standard drinks    Types: 2 Cans of beer per week    Comment: rare beer.  . Drug use: No  . Sexual activity: Yes  Lifestyle  . Physical activity    Days per week: Not on file    Minutes per session: Not on file  . Stress: Not on file  Relationships  . Social Herbalist on phone: Not on file    Gets together: Not on file    Attends religious service: Not on file    Active member of club or organization: Not on file    Attends meetings of clubs or organizations: Not on file    Relationship status: Not on  file  . Intimate partner violence    Fear of current or ex partner: No    Emotionally abused: No    Physically abused: No    Forced sexual activity: No  Other Topics Concern  . Not on file  Social History Narrative   Lives in [place] with wife. Moved from [place] (2013). Does not routinely exercise.   Current Outpatient Medications on File Prior to Visit  Medication Sig Dispense Refill  . allopurinol (ZYLOPRIM) 100 MG tablet Take 200 mg by mouth daily as needed (for gout symptoms).     Marland Kitchen amiodarone (PACERONE) 200 MG tablet Take 1 tablet (200 mg total) by mouth daily. (Patient not taking: Reported on 05/09/2019) 30  tablet 3  . apixaban (ELIQUIS) 2.5 MG TABS tablet Take 1 tablet (2.5 mg total) by mouth 2 (two) times daily. 90 tablet 0  . cyanocobalamin 1000 MCG tablet Take 1 tablet (1,000 mcg total) by mouth daily. 90 tablet 0  . diclofenac sodium (VOLTAREN) 1 % GEL Apply 4 g topically 4 (four) times daily. 1 Tube 0  . docusate sodium (COLACE) 100 MG capsule Take 100 mg by mouth 2 (two) times daily.    . finasteride (PROSCAR) 5 MG tablet Take 1 tablet (5 mg total) by mouth daily. 30 tablet 0  . glipiZIDE (GLUCOTROL) 5 MG tablet Take 5 mg by mouth daily before breakfast.    . hydrALAZINE (APRESOLINE) 25 MG tablet Take 1.5 tablets (37.5 mg total) by mouth 3 (three) times daily. 405 tablet 3  . HYDROcodone-acetaminophen (NORCO/VICODIN) 5-325 MG tablet Take 2 tablets by mouth every 4 (four) hours as needed. 6 tablet 0  . ipratropium-albuterol (DUONEB) 0.5-2.5 (3) MG/3ML SOLN Take 3 mLs by nebulization every 4 (four) hours as needed. 360 mL 0  . isosorbide mononitrate (IMDUR) 30 MG 24 hr tablet Take 1 tablet (30 mg total) by mouth daily. 90 tablet 3  . ketoconazole (NIZORAL) 2 % shampoo Apply 1 application topically 2 (two) times a week.    . levothyroxine (SYNTHROID) 125 MCG tablet Take 1 tablet (125 mcg total) by mouth daily before breakfast. (Patient taking differently: Take 125 mcg by mouth  daily before breakfast. ) 90 tablet 1  . magnesium oxide (MAG-OX) 400 MG tablet Take 400 mg by mouth daily.    . methocarbamol (ROBAXIN) 500 MG tablet Take 1 tablet (500 mg total) by mouth every 6 (six) hours as needed for muscle spasms. 30 tablet 0  . nitroGLYCERIN (NITROSTAT) 0.4 MG SL tablet Place 1 tablet (0.4 mg total) under the tongue every 5 (five) minutes x 3 doses as needed for chest pain. (Patient not taking: Reported on 05/09/2019) 25 tablet 0  . rosuvastatin (CRESTOR) 10 MG tablet Take 1 tablet (10 mg total) by mouth daily at 6 PM. 30 tablet 5  . sertraline (ZOLOFT) 25 MG tablet Take 25 mg by mouth daily.     Marland Kitchen. spironolactone (ALDACTONE) 25 MG tablet Take 1 tablet (25 mg total) by mouth daily. 30 tablet 6  . tamsulosin (FLOMAX) 0.4 MG CAPS capsule Take 0.4 mg by mouth at bedtime.    . torsemide (DEMADEX) 20 MG tablet Take 40 mg daily alternating with 20 mg daily 90 tablet 3   No current facility-administered medications on file prior to visit.    Allergies  Allergen Reactions  . Lisinopril Swelling    Facial swelling  . Xarelto [Rivaroxaban] Other (See Comments)    "Blood came out of my penis"   Family History  Problem Relation Age of Onset  . Heart disease Mother   . Heart Problems Maternal Grandmother   . Other Other        negative for premature CAD    PE: BP 130/80   Pulse 85   Ht 5\' 7"  (1.702 m)   Wt 197 lb (89.4 kg)   SpO2 96%   BMI 30.85 kg/m  Wt Readings from Last 3 Encounters:  05/29/19 197 lb (89.4 kg)  05/28/19 197 lb (89.4 kg)  05/23/19 194 lb (88 kg)   Constitutional: overweight, in NAD Eyes: PERRLA, EOMI, no exophthalmos ENT: moist mucous membranes, no thyromegaly, no cervical lymphadenopathy Cardiovascular: RRR, No MRG Respiratory: CTA B Gastrointestinal: abdomen soft, NT,  ND, BS+ Musculoskeletal: no deformities, strength intact in all 4 Skin: moist, warm, no rashes Neurological: no tremor with outstretched hands, DTR normal in all  4  ASSESSMENT: 1. 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 Patient with long-standing hypothyroidism, on levothyroxine therapy.  His TSH levels have been very high in the last 2 years.  Latest levothyroxine dose is from earlier this month, increased to 125 mcg daily. - he appears euthyroid.  - he does not appear to have a goiter, thyroid nodules, or neck compression symptoms - We discussed about correct intake of levothyroxine, fasting, with water, separated by at least 30 minutes from breakfast, and separated by more than 4 hours from calcium, iron, multivitamins, acid reflux medications (PPIs).  She is taking it correctly.  He denies missed levothyroxine doses. - will check thyroid tests now, almost a months from the previous check: TSH, free T4 - If labs today are abnormal, he will need to return in ~6 weeks for repeat labs - Otherwise, I will see him back in 4 months  Office Visit on 05/29/2019  Component Date Value Ref Range Status  . TSH 05/29/2019 9.39* 0.35 - 4.50 uIU/mL Final  . T3, Free 05/29/2019 2.7  2.3 - 4.2 pg/mL Final  . Free T4 05/29/2019 0.87  0.60 - 1.60 ng/dL Final   Comment: Specimens from patients who are undergoing biotin therapy and /or ingesting biotin supplements may contain high levels of biotin.  The higher biotin concentration in these specimens interferes with this Free T4 assay.  Specimens that contain high levels  of biotin may cause false high results for this Free T4 assay.  Please interpret results in light of the total clinical presentation of the patient.     TSH much better >> continue same LT4 dose and recheck in 5-6 weeks.  Carlus Pavlov, MD PhD [facility] Endocrinology

## 2019-05-29 NOTE — Progress Notes (Signed)
Heart Failure TeleHealth Note  Due to national recommendations of social distancing due to COVID 19, Audio/video telehealth visit is felt to be most appropriate for this patient at this time.  See MyChart message from today for patient consent regarding telehealth for Ryan Macdonald.  Date:  05/29/2019   ID:  Ryan Macdonald, DOB [birth year], MRN [id number]  Location: Home  Provider location: [facility] Type of Visit: Established patient   [facility]  Cardiologist:  Dr. Shirlee LatchMcLean  Chief Complaint: Exertional shortness of breath   History of Present Illness: Ryan CoonFred Razon is a 85 y.o. male who presents via audio/video conferencing for a telehealth visit today.   I was assisted by paramedicine on-site.   he denies symptoms worrisome for COVID 19.   Patient has a history ofsystolic HF due toNICM, Biotronic ICD, A Fibon Eliquis, DMII, HTN,CAD s/p pLAD stent in 07/2018 with residual non-obstructive disease,chronic hypoxic respiratory failure on home O2 PRN,and depression.  Admitted 12/26 - 11/28/18 with A/C systolic CHF. Diuresed 21 lbs and meds adjusted as tolerated. Echo showed EF 20-25%. EP was consulted for RV pacing and ICD settings were adjusted => patient was reprogrammed to VDD, now with narrow QRS with pacing.   Amiodarone was stopped with marked hypothyroidism.   Patient feels like he has been doing well.  No chest pain.  No orthopnea/PND. He is not on digoxin or beta blocker due to bradycardia. Weight has been stable.   No dyspnea walking on flat ground.   Labs (4/20): K 4.1, creatinine 2.23, LDL 58 Labs (6/20): TSH 43, K 4.6, creatinine 1.67  PMH: 1. Atrial fibrillation: Paroxysmal. 2. CKD: Stage 3. 3. Type 2 diabetes 4. Hyperlipidemia 5. HTN 6. Nephrolithiasis 7. ?NAFLD 8. Gout  9. CAD: LHC (9/19) with 40% distal LM, 50% ostial LAD, 50% mid LAD, 75% proximal LAD treated with DES.  10. Chronic hypoxemic respiratory  failure. Has home oxygen. ?COPD: Prior smoker.  11. CHF: EF low since 2014. Primarily nonischemic cardiomyopathy. Biotronik ICD. - Echo (12/19) with EF 20-25%, diffuse hypokinesis, moderate MR, mildly dilated RV with moderately decreased systolic function, PASP 39 mmHg.  - ACEI angioedema.  12. Hypothyroidism   Current Outpatient Medications  Medication Sig Dispense Refill  . allopurinol (ZYLOPRIM) 100 MG tablet Take 200 mg by mouth daily as needed (for gout symptoms).     Marland Kitchen. amiodarone (PACERONE) 200 MG tablet Take 1 tablet (200 mg total) by mouth daily. (Patient not taking: Reported on 05/09/2019) 30 tablet 3  . apixaban (ELIQUIS) 2.5 MG TABS tablet Take 1 tablet (2.5 mg total) by mouth 2 (two) times daily. 90 tablet 0  . cyanocobalamin 1000 MCG tablet Take 1 tablet (1,000 mcg total) by mouth daily. 90 tablet 0  . diclofenac sodium (VOLTAREN) 1 % GEL Apply 4 g topically 4 (four) times daily. 1 Tube 0  . docusate sodium (COLACE) 100 MG capsule Take 100 mg by mouth 2 (two) times daily.    . finasteride (PROSCAR) 5 MG tablet Take 1 tablet (5 mg total) by mouth daily. 30 tablet 0  . glipiZIDE (GLUCOTROL) 5 MG tablet Take 5 mg by mouth daily before breakfast.    . hydrALAZINE (APRESOLINE) 25 MG tablet Take 1.5 tablets (37.5 mg total) by mouth 3 (three) times daily. 405 tablet 3  . HYDROcodone-acetaminophen (NORCO/VICODIN) 5-325 MG tablet Take 2 tablets by mouth every 4 (four) hours as needed. 6 tablet 0  . ipratropium-albuterol (DUONEB) 0.5-2.5 (3)  MG/3ML SOLN Take 3 mLs by nebulization every 4 (four) hours as needed. 360 mL 0  . isosorbide mononitrate (IMDUR) 30 MG 24 hr tablet Take 1 tablet (30 mg total) by mouth daily. 90 tablet 3  . ketoconazole (NIZORAL) 2 % shampoo Apply 1 application topically 2 (two) times a week.    . levothyroxine (SYNTHROID) 125 MCG tablet Take 1 tablet (125 mcg total) by mouth daily before breakfast. (Patient taking differently: Take 125 mcg by mouth daily before  breakfast. ) 90 tablet 1  . magnesium oxide (MAG-OX) 400 MG tablet Take 400 mg by mouth daily.    . methocarbamol (ROBAXIN) 500 MG tablet Take 1 tablet (500 mg total) by mouth every 6 (six) hours as needed for muscle spasms. 30 tablet 0  . nitroGLYCERIN (NITROSTAT) 0.4 MG SL tablet Place 1 tablet (0.4 mg total) under the tongue every 5 (five) minutes x 3 doses as needed for chest pain. 25 tablet 0  . rosuvastatin (CRESTOR) 10 MG tablet Take 1 tablet (10 mg total) by mouth daily at 6 PM. 30 tablet 5  . sertraline (ZOLOFT) 25 MG tablet Take 25 mg by mouth daily.     Marland Kitchen spironolactone (ALDACTONE) 25 MG tablet Take 1 tablet (25 mg total) by mouth daily. 30 tablet 6  . tamsulosin (FLOMAX) 0.4 MG CAPS capsule Take 0.4 mg by mouth at bedtime.    . torsemide (DEMADEX) 20 MG tablet Take 40 mg daily alternating with 20 mg daily 90 tablet 3   No current facility-administered medications for this encounter.     Allergies:   Lisinopril and Xarelto [rivaroxaban]   Social History:  The patient  reports that he has quit smoking. His smoking use included cigarettes and cigars. He has a 15.00 pack-year smoking history. He has quit using smokeless tobacco. He reports that he does not drink alcohol or use drugs.   Family History:  The patient's family history includes Heart Problems in his maternal grandmother; Heart disease in his mother; Other in an other family member.   ROS:  Please see the history of present illness.   All other systems are personally reviewed and negative.   Exam:  (Video/Tele Health Call; Exam is subjective and or/visual.) BP 124/64, HR 72 General: NAD Neck: No JVD Lungs: Normal respiratory effort with conversation.  Abdomen: Non-distended.  Extremities: no edema Neuro: Alert/oriented x 3.  General:  Speaks in full sentences. No resp difficulty.  Recent Labs: 11/23/2018: B Natriuretic Peptide 1,382.9 03/10/2019: Magnesium 1.6 03/20/2019: ALT 40; Hemoglobin 10.7; Platelets  258 05/03/2019: BUN 31; Creatinine, Ser 1.67; Potassium 4.6; Sodium 139; TSH 42.390  Personally reviewed   Wt Readings from Last 3 Encounters:  05/29/19 89.4 kg (197 lb)  05/28/19 89.4 kg (197 lb)  05/23/19 88 kg (194 lb)      ASSESSMENT AND PLAN:  1. Chronic systolic CHF: Primarily nonischemic cardiomyopathy. EF has been low since at least 2014.  He has single chamber Biotronik ICD. QRS not wide enough for CRT.Settings adjusted in the past to prevent RV pacing in the face of a long 1st degree AV block, has been VDD pacing with narrow QRS.  Most recent echo in 12/19 with EF 20-25%, diffuse hypokinesis, moderate RV systolic dysfunction. NYHA class II symptoms.  He does not look volume overloaded on exam.     - Continue torsemide 40 daily alternating with 20 mg daily.  I will arrange for BMET.   - Continue spironolactone 25 mg qhs.   -  Increase hydralazine to 37.5 mg tid today and Imdur to 30 mg daily. Bidil not covered by [facility]. -Hold BB with long 1st degree AVB to limit RV pacing.  -No ACEI or ARNI with angioedema on lisinopril. - Consider Cardiomems in the future to monitor volume.  2. CAD: S/p PCI 07/2018 to pLAD but CAD does not explain his cardiomyopathy.  No chest pain.  - He is off Plavix now, on Eliquis for his atrial fibrillation.  - Continue Crestor, good lipids 4/20.   3.Atrial fibrillation: Paroxysmal.  Regular on exam per paramedicine.   - Continue Eliquis 2.5 mg bid (age, creatinine > 1.5).  - He is now off amiodarone with marked hypothyroism.  4. CKD: Stage 3.  I will arrange for BMET.  5. Chronic hypoxemic respiratory failure: He is not currently using oxygen.  Prior smoker.  - Needs eventual PFTs but cannot get currently due to coronavirus restrictions.  6. Hypothyroidism: Suspect this is due to amiodarone.  Levoxyl recently increased.  - He has endocrinology appt.   COVID screen The patient does not have any symptoms that suggest any further testing/ screening at  this time.  Social distancing reinforced today.  Relevant cardiac medications were reviewed at length with the patient today.   The patient does not have concerns regarding their medications at this time.   Recommended follow-up:  2 months in office.   Today, I have spent 18 minutes with the patient with telehealth technology discussing the above issues .    Signed, [name], MD  05/29/2019 11:53 PM  [facility] [address] Heart and Vascular [place] [address] [phone] (office) [phone] (fax)

## 2019-05-30 ENCOUNTER — Telehealth: Payer: Self-pay | Admitting: Internal Medicine

## 2019-05-30 LAB — T4, FREE: Free T4: 0.87 ng/dL (ref 0.60–1.60)

## 2019-05-30 LAB — TSH: TSH: 9.39 u[IU]/mL — ABNORMAL HIGH (ref 0.35–4.50)

## 2019-05-30 LAB — T3, FREE: T3, Free: 2.7 pg/mL (ref 2.3–4.2)

## 2019-05-30 NOTE — Telephone Encounter (Signed)
Patient just had thyroid labs that Dr. Cruzita Lederer has not yet reviewed, patient may need dose change. Will have Dr. Cruzita Lederer review tomorrow.

## 2019-05-30 NOTE — Telephone Encounter (Signed)
See lab note - he should have enough 125 mcg LT4.Marland KitchenMarland Kitchen

## 2019-05-30 NOTE — Telephone Encounter (Signed)
MEDICATION: Levothyroxine 125 MCG  PHARMACY:  [facility] 90 DAY SUPPLY :  yes  IS PATIENT OUT OF MEDICATION: no  IF NOT; HOW MUCH IS LEFT: 1 week  LAST APPOINTMENT DATE: @6 /30/2020  NEXT APPOINTMENT DATE:@11 /01/2019  DO WE HAVE YOUR PERMISSION TO LEAVE A DETAILED MESSAGE: YES   OTHER COMMENTS:    **Let patient know to contact pharmacy at the end of the day to make sure medication is ready. **  ** Please notify patient to allow 48-72 hours to process**  **Encourage patient to contact the pharmacy for refills or they can request refills through [facility]**

## 2019-05-31 MED ORDER — LEVOTHYROXINE SODIUM 125 MCG PO TABS: 125.0000 ug | ORAL_TABLET | Freq: Every day | ORAL | 1 refills | Status: DC

## 2019-05-31 NOTE — Addendum Note (Signed)
Addended by: Cardell Peach I on: 05/31/2019 10:45 AM   Modules accepted: Orders

## 2019-05-31 NOTE — Telephone Encounter (Signed)
RX was No print.  RX has been reordered and sent to pharmacy.

## 2019-06-13 ENCOUNTER — Other Ambulatory Visit: Payer: Self-pay

## 2019-06-13 ENCOUNTER — Ambulatory Visit (HOSPITAL_COMMUNITY)
Admission: RE | Admit: 2019-06-13 | Discharge: 2019-06-13 | Disposition: A | Discharge status: Home / Self Care | Payer: No Typology Code available for payment source | Source: Ambulatory Visit | Attending: Cardiology | Admitting: Cardiology

## 2019-06-13 DIAGNOSIS — I5022 Chronic systolic (congestive) heart failure: Secondary | ICD-10-CM | POA: Diagnosis present

## 2019-06-13 DIAGNOSIS — I428 Other cardiomyopathies: Secondary | ICD-10-CM

## 2019-06-13 LAB — BASIC METABOLIC PANEL
Anion gap: 12 (ref 5–15)
BUN: 54 mg/dL — ABNORMAL HIGH (ref 8–23)
CO2: 19 mmol/L — ABNORMAL LOW (ref 22–32)
Calcium: 9.2 mg/dL (ref 8.9–10.3)
Chloride: 104 mmol/L (ref 98–111)
Creatinine, Ser: 2.09 mg/dL — ABNORMAL HIGH (ref 0.61–1.24)
GFR calc Af Amer: 33 mL/min — ABNORMAL LOW (ref >60)
GFR calc non Af Amer: 29 mL/min — ABNORMAL LOW (ref >60)
Glucose, Bld: 71 mg/dL (ref 70–99)
Potassium: 4.2 mmol/L (ref 3.5–5.1)
Sodium: 135 mmol/L (ref 135–145)

## 2019-06-13 LAB — T4, FREE: Free T4: 1.25 ng/dL — ABNORMAL HIGH (ref 0.61–1.12)

## 2019-06-13 LAB — TSH: TSH: 3.523 u[IU]/mL (ref 0.350–4.500)

## 2019-06-14 ENCOUNTER — Other Ambulatory Visit (HOSPITAL_COMMUNITY): Payer: Self-pay

## 2019-06-14 ENCOUNTER — Telehealth (HOSPITAL_COMMUNITY): Payer: Self-pay

## 2019-06-14 DIAGNOSIS — I5022 Chronic systolic (congestive) heart failure: Secondary | ICD-10-CM

## 2019-06-14 LAB — T3, FREE: T3, Free: 2 pg/mL (ref 2.0–4.4)

## 2019-06-14 NOTE — Telephone Encounter (Signed)
Relayed message to pt, verbalized understanding of med changes and repeat labs scheduled

## 2019-06-20 ENCOUNTER — Other Ambulatory Visit (HOSPITAL_COMMUNITY): Payer: Self-pay

## 2019-06-20 NOTE — Progress Notes (Signed)
Paramedicine Encounter    Patient ID: Ryan Macdonald, male    DOB: 14-Jul-1934, 85 y.o.   MRN: [id number]   Patient Care Team: [facility], [place] as PCP - General ([place]) Skeet Latch, MD as PCP - Cardiology (Cardiology) Larey Dresser, MD as PCP - Advanced Heart Failure (Cardiology)  Patient Active Problem List   Diagnosis Date Noted  . AKI (acute kidney injury) ([place]) 03/14/2019  . Spasm of muscle of lower back 03/14/2019  . Acute renal failure (ARF) ([place]) 03/09/2019  . Transaminitis   . Acute on chronic renal failure ([place]) 03/08/2019  . Acute exacerbation of CHF (congestive heart failure) ([place]) 11/23/2018  . Acute on chronic heart failure ([place]) 10/19/2018  . Pulmonary emphysema ([place])   . Chronic respiratory failure with hypoxia ([place])   . Morbid obesity ([place]) 09/09/2018  . URI (upper respiratory infection) 08/31/2018  . UTI (urinary tract infection) 08/31/2018  . Chronic systolic (congestive) heart failure ([place]) 08/28/2018  . CHF (congestive heart failure) ([place]) 08/14/2018  . Acute on chronic combined systolic and diastolic CHF (congestive heart failure) ([place]) 07/18/2018  . First degree AV block   . Shortness of breath 04/29/2018  . Acute HFrEF (heart failure with reduced ejection fraction) ([place]) 04/29/2018  . Acute respiratory failure with hypoxia ([place]) 09/27/2017  . CHF exacerbation ([place]) 09/27/2017  . Renal insufficiency   . Acute on chronic systolic CHF (congestive heart failure) ([place]) 07/08/2017  . Left knee pain 07/08/2017  . Acute bronchitis 05/10/2017  . Acute congestive heart failure ([place]) 04/14/2017  . Elevated troponin 04/14/2017  . Lactic acidosis   . Acute on chronic systolic heart failure, NYHA class 2 ([place]) 02/03/2017  . Diabetes mellitus type 2 in obese ([place]) 02/03/2017  . CAP (community acquired pneumonia) 02/03/2017  . Lobar pneumonia ([place])   . Diabetes mellitus with complication ([place])   . Acute on chronic respiratory failure with  hypoxia ([place])   . Acute on chronic combined systolic and diastolic heart failure ([place]) 05/19/2016  . Depression 05/19/2016  . Gout 05/19/2016  . 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 NSVT (nonsustained ventricular tachycardia) ([place])   . Chronic renal insufficiency, stage 3 (moderate) (HCC)   . Hyperkalemia 08/21/2015  . Pulmonary embolism ([place])   . Acute respiratory failure ([place])   . Osteoarthritis of left hip 10/25/2013  . Atrial fibrillation ([place]) 10/25/2013  . S/P hip replacement 10/24/2013  . Nonischemic cardiomyopathy ([place])   . Obesity (BMI 30-39.9)   . Essential hypertension   . Hyperlipidemia     Current Outpatient Medications:  .  allopurinol (ZYLOPRIM) 100 MG tablet, Take 200 mg by mouth daily as needed (for gout symptoms). , Disp: , Rfl:  .  apixaban (ELIQUIS) 2.5 MG TABS tablet, Take 1 tablet (2.5 mg total) by mouth 2 (two) times daily., Disp: 90 tablet, Rfl: 0 .  cyanocobalamin 1000 MCG tablet, Take 1 tablet (1,000 mcg total) by mouth daily., Disp: 90 tablet, Rfl: 0 .  diclofenac sodium (VOLTAREN) 1 % GEL, Apply 4 g topically 4 (four) times daily., Disp: 1 Tube, Rfl: 0 .  docusate sodium (COLACE) 100 MG capsule, Take 100 mg by mouth 2 (two) times daily., Disp: , Rfl:  .  finasteride (PROSCAR) 5 MG tablet, Take 1 tablet (5 mg total) by mouth daily., Disp: 30 tablet, Rfl: 0 .  glipiZIDE (GLUCOTROL) 5 MG tablet, Take 5 mg by mouth daily before breakfast., Disp: , Rfl:  .  hydrALAZINE (APRESOLINE) 25 MG tablet,  Take 1.5 tablets (37.5 mg total) by mouth 3 (three) times daily., Disp: 405 tablet, Rfl: 3 .  HYDROcodone-acetaminophen (NORCO/VICODIN) 5-325 MG tablet, Take 2 tablets by mouth every 4 (four) hours as needed., Disp: 6 tablet, Rfl: 0 .  ipratropium-albuterol (DUONEB) 0.5-2.5 (3) MG/3ML SOLN, Take 3 mLs by nebulization every 4 (four) hours as needed., Disp: 360 mL, Rfl: 0 .  isosorbide mononitrate (IMDUR) 30 MG 24 hr tablet, Take 1 tablet (30  mg total) by mouth daily., Disp: 90 tablet, Rfl: 3 .  ketoconazole (NIZORAL) 2 % shampoo, Apply 1 application topically 2 (two) times a week., Disp: , Rfl:  .  levothyroxine (SYNTHROID) 125 MCG tablet, Take 1 tablet (125 mcg total) by mouth daily before breakfast., Disp: 90 tablet, Rfl: 1 .  magnesium oxide (MAG-OX) 400 MG tablet, Take 400 mg by mouth daily., Disp: , Rfl:  .  methocarbamol (ROBAXIN) 500 MG tablet, Take 1 tablet (500 mg total) by mouth every 6 (six) hours as needed for muscle spasms., Disp: 30 tablet, Rfl: 0 .  rosuvastatin (CRESTOR) 10 MG tablet, Take 1 tablet (10 mg total) by mouth daily at 6 PM., Disp: 30 tablet, Rfl: 5 .  sertraline (ZOLOFT) 25 MG tablet, Take 25 mg by mouth daily. , Disp: , Rfl:  .  spironolactone (ALDACTONE) 25 MG tablet, Take 1 tablet (25 mg total) by mouth daily., Disp: 30 tablet, Rfl: 6 .  tamsulosin (FLOMAX) 0.4 MG CAPS capsule, Take 0.4 mg by mouth at bedtime., Disp: , Rfl:  .  torsemide (DEMADEX) 20 MG tablet, Take 40 mg daily alternating with 20 mg daily, Disp: 90 tablet, Rfl: 3 .  amiodarone (PACERONE) 200 MG tablet, Take 1 tablet (200 mg total) by mouth daily. (Patient not taking: Reported on 05/09/2019), Disp: 30 tablet, Rfl: 3 .  nitroGLYCERIN (NITROSTAT) 0.4 MG SL tablet, Place 1 tablet (0.4 mg total) under the tongue every 5 (five) minutes x 3 doses as needed for chest pain. (Patient not taking: Reported on 06/20/2019), Disp: 25 tablet, Rfl: 0 Allergies  Allergen Reactions  . Lisinopril Swelling    Facial swelling  . Xarelto [Rivaroxaban] Other (See Comments)    "Blood came out of my penis"      Social History   Socioeconomic History  . Marital status: Married    Spouse name: Not on file  . Number of children: Not on file  . Years of education: Not on file  . Highest education level: Not on file  Occupational History  . Occupation: Retired    Comment: Former Patent examiner  . Financial resource strain: Not on file  . Food  insecurity    Worry: Not on file    Inability: Not on file  . Transportation needs    Medical: Not on file    Non-medical: Not on file  Tobacco Use  . Smoking status: Former Smoker    Packs/day: 1.00    Years: 15.00    Pack years: 15.00    Types: Cigarettes, Cigars  . Smokeless tobacco: Former Neurosurgeon  . Tobacco comment: quit smoking ~ 50 yr ago  Substance and Sexual Activity  . Alcohol use: No    Alcohol/week: 2.0 standard drinks    Types: 2 Cans of beer per week    Comment: rare beer.  . Drug use: No  . Sexual activity: Yes  Lifestyle  . Physical activity    Days per week: Not on file    Minutes per session: Not on  file  . Stress: Not on file  Relationships  . Social Herbalist on phone: Not on file    Gets together: Not on file    Attends religious service: Not on file    Active member of club or organization: Not on file    Attends meetings of clubs or organizations: Not on file    Relationship status: Not on file  . Intimate partner violence    Fear of current or ex partner: No    Emotionally abused: No    Physically abused: No    Forced sexual activity: No  Other Topics Concern  . Not on file  Social History Narrative   Lives in [place] with wife. Moved from [place] (2013). Does not routinely exercise.    Physical Exam      Future Appointments  Date Time Provider [place]  06/21/2019  7:20 AM [facility] DEVICE REMOTES [facility]  06/26/2019 11:00 AM [facility] LAB [facility] None  08/10/2019  2:20 PM Larey Dresser, MD [facility] None  10/02/2019 11:00 AM Philemon Kingdom, MD [facility] None    BP 128/64   Pulse 70   Temp (!) 97.3 F (36.3 C)   Resp 16   Wt 191 lb (86.6 kg)   SpO2 97%   BMI 29.91 kg/m   Weight yesterday-191 Last visit weight-197 [facility]-97   Pt reports feeling good, he denies c/p, no sob, no dizziness, he did state one day he was on his feet a long period of time during the day and his feet began  to swell some, but after he laid down it went away.   meds verified, he did miss numerous doses of meds, the evening doses and the mid dose of hydralazine too.  3 pill boxes were done for pt.  Weight fluctuating from 191-195, at one point it got up to 200. Then the next day it came down--he thinks it was diet indiscretion.  He has +3 edema to his legs.  Reviewed his food intake and he does take in more sodium than he realizes.   Marylouise Stacks, [facility] Paramedic  06/20/19

## 2019-06-21 ENCOUNTER — Ambulatory Visit (INDEPENDENT_AMBULATORY_CARE_PROVIDER_SITE_OTHER): Payer: No Typology Code available for payment source | Admitting: *Deleted

## 2019-06-21 DIAGNOSIS — I509 Heart failure, unspecified: Secondary | ICD-10-CM | POA: Diagnosis not present

## 2019-06-21 DIAGNOSIS — I428 Other cardiomyopathies: Secondary | ICD-10-CM

## 2019-06-21 LAB — CUP PACEART REMOTE DEVICE CHECK
Battery Remaining Percentage: 67 %
Brady Statistic RV Percent Paced: 0 %
Date Time Interrogation Session: 20200723104036
HighPow Impedance: 88 Ohm
Implantable Lead Location: 753860
Implantable Lead Model: 365501
Implantable Lead Serial Number: 10600411
Lead Channel Impedance Value: 549 Ohm
Lead Channel Sensing Intrinsic Amplitude: 2.3 mV
Lead Channel Sensing Intrinsic Amplitude: 20 mV
Lead Channel Setting Pacing Amplitude: 2.5 V
Lead Channel Setting Pacing Pulse Width: 0.4 ms
Pulse Gen Model: 399436
Pulse Gen Serial Number: 60886010

## 2019-06-26 ENCOUNTER — Other Ambulatory Visit (HOSPITAL_COMMUNITY): Payer: No Typology Code available for payment source

## 2019-07-03 NOTE — Progress Notes (Signed)
Remote ICD transmission.   

## 2019-07-09 ENCOUNTER — Ambulatory Visit (HOSPITAL_COMMUNITY)
Admission: RE | Admit: 2019-07-09 | Discharge: 2019-07-09 | Disposition: A | Discharge status: Home / Self Care | Payer: No Typology Code available for payment source | Source: Ambulatory Visit | Attending: Cardiology | Admitting: Cardiology

## 2019-07-09 ENCOUNTER — Other Ambulatory Visit: Payer: Self-pay

## 2019-07-09 DIAGNOSIS — I5022 Chronic systolic (congestive) heart failure: Secondary | ICD-10-CM | POA: Diagnosis present

## 2019-07-09 LAB — BASIC METABOLIC PANEL
Anion gap: 12 (ref 5–15)
BUN: 60 mg/dL — ABNORMAL HIGH (ref 8–23)
CO2: 22 mmol/L (ref 22–32)
Calcium: 9.3 mg/dL (ref 8.9–10.3)
Chloride: 102 mmol/L (ref 98–111)
Creatinine, Ser: 1.83 mg/dL — ABNORMAL HIGH (ref 0.61–1.24)
GFR calc Af Amer: 39 mL/min — ABNORMAL LOW (ref >60)
GFR calc non Af Amer: 33 mL/min — ABNORMAL LOW (ref >60)
Glucose, Bld: 102 mg/dL — ABNORMAL HIGH (ref 70–99)
Potassium: 4.3 mmol/L (ref 3.5–5.1)
Sodium: 136 mmol/L (ref 135–145)

## 2019-07-10 ENCOUNTER — Telehealth (HOSPITAL_COMMUNITY): Payer: Self-pay

## 2019-07-10 DIAGNOSIS — I5022 Chronic systolic (congestive) heart failure: Secondary | ICD-10-CM

## 2019-07-10 MED ORDER — TORSEMIDE 20 MG PO TABS: 20.0000 mg | ORAL_TABLET | Freq: Every day | ORAL | 3 refills | Status: DC

## 2019-07-10 NOTE — Telephone Encounter (Signed)
Spoke with patient, aware of results of blood work. Aware of  recommendations and med changes as well.  New rx faxed to [facility] pharmacy at [facility] with attn to Dr Renelda Mom.

## 2019-07-10 NOTE — Telephone Encounter (Signed)
-----   Message from Larey Dresser, MD sent at 07/09/2019  9:35 PM EDT ----- Decrease torsemide dose to 20 mg daily, make sure he follows low sodium diet. BMET 10 days.

## 2019-07-10 NOTE — Telephone Encounter (Signed)
[facility] fax [phone]

## 2019-07-11 ENCOUNTER — Other Ambulatory Visit (HOSPITAL_COMMUNITY): Payer: Self-pay

## 2019-07-11 ENCOUNTER — Other Ambulatory Visit (HOSPITAL_COMMUNITY): Payer: Self-pay | Admitting: *Deleted

## 2019-07-11 MED ORDER — TORSEMIDE 20 MG PO TABS: 20.0000 mg | ORAL_TABLET | Freq: Every day | ORAL | 3 refills | Status: DC

## 2019-07-11 MED ORDER — ISOSORBIDE MONONITRATE ER 30 MG PO TB24: 30.0000 mg | ORAL_TABLET | Freq: Every day | ORAL | 3 refills | Status: DC

## 2019-07-11 NOTE — Progress Notes (Signed)
Paramedicine Encounter    Patient ID: Ryan Macdonald, male    DOB: August 13, 1934, 85 y.o.   MRN: [id number]   Patient Care Team: [facility], [facility] as PCP - General (General Practice) [name], MD as PCP - Cardiology (Cardiology) Laurey Morale, MD as PCP - Advanced Heart Failure (Cardiology)  Patient Active Problem List   Diagnosis Date Noted  . AKI (acute kidney injury) (HCC) 03/14/2019  . Spasm of muscle of lower back 03/14/2019  . Acute renal failure (ARF) (HCC) 03/09/2019  . Transaminitis   . Acute on chronic renal failure (HCC) 03/08/2019  . Acute exacerbation of CHF (congestive heart failure) (HCC) 11/23/2018  . Acute on chronic heart failure (HCC) 10/19/2018  . Pulmonary emphysema (HCC)   . Chronic respiratory failure with hypoxia (HCC)   . Morbid obesity (HCC) 09/09/2018  . URI (upper respiratory infection) 08/31/2018  . UTI (urinary tract infection) 08/31/2018  . Chronic systolic (congestive) heart failure (HCC) 08/28/2018  . CHF (congestive heart failure) (HCC) 08/14/2018  . Acute on chronic combined systolic and diastolic CHF (congestive heart failure) (HCC) 07/18/2018  . First degree AV block   . Shortness of breath 04/29/2018  . Acute HFrEF (heart failure with reduced ejection fraction) (HCC) 04/29/2018  . Acute respiratory failure with hypoxia (HCC) 09/27/2017  . CHF exacerbation (HCC) 09/27/2017  . Renal insufficiency   . Acute on chronic systolic CHF (congestive heart failure) (HCC) 07/08/2017  . Left knee pain 07/08/2017  . Acute bronchitis 05/10/2017  . Acute congestive heart failure (HCC) 04/14/2017  . Elevated troponin 04/14/2017  . Lactic acidosis   . Acute on chronic systolic heart failure, NYHA class 2 (HCC) 02/03/2017  . Diabetes mellitus type 2 in obese (HCC) 02/03/2017  . CAP (community acquired pneumonia) 02/03/2017  . Lobar pneumonia (HCC)   . Diabetes mellitus with complication (HCC)   . Acute on chronic respiratory failure with  hypoxia (HCC)   . Acute on chronic combined systolic and diastolic heart failure (HCC) 05/19/2016  . Depression 05/19/2016  . Gout 05/19/2016  . 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 NSVT (nonsustained ventricular tachycardia) (HCC)   . Chronic renal insufficiency, stage 3 (moderate) (HCC)   . Hyperkalemia 08/21/2015  . Pulmonary embolism (HCC)   . Acute respiratory failure (HCC)   . Osteoarthritis of left hip 10/25/2013  . Atrial fibrillation (HCC) 10/25/2013  . S/P hip replacement 10/24/2013  . Nonischemic cardiomyopathy (HCC)   . Obesity (BMI 30-39.9)   . Essential hypertension   . Hyperlipidemia     Current Outpatient Medications:  .  allopurinol (ZYLOPRIM) 100 MG tablet, Take 200 mg by mouth daily as needed (for gout symptoms). , Disp: , Rfl:  .  apixaban (ELIQUIS) 2.5 MG TABS tablet, Take 1 tablet (2.5 mg total) by mouth 2 (two) times daily., Disp: 90 tablet, Rfl: 0 .  cyanocobalamin 1000 MCG tablet, Take 1 tablet (1,000 mcg total) by mouth daily., Disp: 90 tablet, Rfl: 0 .  diclofenac sodium (VOLTAREN) 1 % GEL, Apply 4 g topically 4 (four) times daily., Disp: 1 Tube, Rfl: 0 .  docusate sodium (COLACE) 100 MG capsule, Take 100 mg by mouth 2 (two) times daily., Disp: , Rfl:  .  finasteride (PROSCAR) 5 MG tablet, Take 1 tablet (5 mg total) by mouth daily., Disp: 30 tablet, Rfl: 0 .  glipiZIDE (GLUCOTROL) 5 MG tablet, Take 5 mg by mouth daily before breakfast., Disp: , Rfl:  .  hydrALAZINE (APRESOLINE) 25 MG tablet,  Take 1.5 tablets (37.5 mg total) by mouth 3 (three) times daily., Disp: 405 tablet, Rfl: 3 .  HYDROcodone-acetaminophen (NORCO/VICODIN) 5-325 MG tablet, Take 2 tablets by mouth every 4 (four) hours as needed., Disp: 6 tablet, Rfl: 0 .  ipratropium-albuterol (DUONEB) 0.5-2.5 (3) MG/3ML SOLN, Take 3 mLs by nebulization every 4 (four) hours as needed., Disp: 360 mL, Rfl: 0 .  isosorbide mononitrate (IMDUR) 30 MG 24 hr tablet, Take 1 tablet (30  mg total) by mouth daily., Disp: 90 tablet, Rfl: 3 .  ketoconazole (NIZORAL) 2 % shampoo, Apply 1 application topically 2 (two) times a week., Disp: , Rfl:  .  levothyroxine (SYNTHROID) 125 MCG tablet, Take 1 tablet (125 mcg total) by mouth daily before breakfast., Disp: 90 tablet, Rfl: 1 .  magnesium oxide (MAG-OX) 400 MG tablet, Take 400 mg by mouth daily., Disp: , Rfl:  .  methocarbamol (ROBAXIN) 500 MG tablet, Take 1 tablet (500 mg total) by mouth every 6 (six) hours as needed for muscle spasms., Disp: 30 tablet, Rfl: 0 .  rosuvastatin (CRESTOR) 10 MG tablet, Take 1 tablet (10 mg total) by mouth daily at 6 PM., Disp: 30 tablet, Rfl: 5 .  sertraline (ZOLOFT) 25 MG tablet, Take 25 mg by mouth daily. , Disp: , Rfl:  .  spironolactone (ALDACTONE) 25 MG tablet, Take 1 tablet (25 mg total) by mouth daily., Disp: 30 tablet, Rfl: 6 .  tamsulosin (FLOMAX) 0.4 MG CAPS capsule, Take 0.4 mg by mouth at bedtime., Disp: , Rfl:  .  torsemide (DEMADEX) 20 MG tablet, Take 1 tablet (20 mg total) by mouth daily., Disp: 90 tablet, Rfl: 3 .  amiodarone (PACERONE) 200 MG tablet, Take 1 tablet (200 mg total) by mouth daily. (Patient not taking: Reported on 05/09/2019), Disp: 30 tablet, Rfl: 3 .  nitroGLYCERIN (NITROSTAT) 0.4 MG SL tablet, Place 1 tablet (0.4 mg total) under the tongue every 5 (five) minutes x 3 doses as needed for chest pain. (Patient not taking: Reported on 06/20/2019), Disp: 25 tablet, Rfl: 0 Allergies  Allergen Reactions  . Lisinopril Swelling    Facial swelling  . Xarelto [Rivaroxaban] Other (See Comments)    "Blood came out of my penis"      Social History   Socioeconomic History  . Marital status: Married    Spouse name: Not on file  . Number of children: Not on file  . Years of education: Not on file  . Highest education level: Not on file  Occupational History  . Occupation: Retired    Comment: Former Patent examinerLPN  Social Needs  . Financial resource strain: Not on file  . Food  insecurity    Worry: Not on file    Inability: Not on file  . Transportation needs    Medical: Not on file    Non-medical: Not on file  Tobacco Use  . Smoking status: Former Smoker    Packs/day: 1.00    Years: 15.00    Pack years: 15.00    Types: Cigarettes, Cigars  . Smokeless tobacco: Former NeurosurgeonUser  . Tobacco comment: quit smoking ~ 50 yr ago  Substance and Sexual Activity  . Alcohol use: No    Alcohol/week: 2.0 standard drinks    Types: 2 Cans of beer per week    Comment: rare beer.  . Drug use: No  . Sexual activity: Yes  Lifestyle  . Physical activity    Days per week: Not on file    Minutes per session: Not on  file  . Stress: Not on file  Relationships  . Social Herbalist on phone: Not on file    Gets together: Not on file    Attends religious service: Not on file    Active member of club or organization: Not on file    Attends meetings of clubs or organizations: Not on file    Relationship status: Not on file  . Intimate partner violence    Fear of current or ex partner: No    Emotionally abused: No    Physically abused: No    Forced sexual activity: No  Other Topics Concern  . Not on file  Social History Narrative   Lives in [place] with wife. Moved from [place] (2013). Does not routinely exercise.    Physical Exam      Future Appointments  Date Time Provider [place]  07/19/2019 10:00 AM [facility] LAB [facility] None  08/10/2019  2:20 PM Larey Dresser, MD [facility] None  09/21/2019  7:35 AM [facility] DEVICE REMOTES [facility]  10/02/2019 11:00 AM Philemon Kingdom, MD [facility] None  12/21/2019  7:35 AM [facility] DEVICE REMOTES [facility]  03/21/2020  7:35 AM [facility] DEVICE REMOTES [facility]  06/20/2020  7:35 AM [facility] DEVICE REMOTES [facility]  09/19/2020  7:35 AM [facility] DEVICE REMOTES [facility]    BP 100/60   Pulse 80   Temp (!) 97.3 F  (36.3 C)   Resp 16   Wt 189 lb (85.7 kg)   SpO2 98%   BMI 29.60 kg/m   B/p standing-100/64  Weight yesterday-186 Last visit weight-191  Pt reports feeling good, is torsemide is being decreased from recent labs. He needs new rx of isosorbide sent in to pharmacy.  meds verified and 3 pill boxes refilled.  He does not have the 112mcg of levothyroxine, he does have the 179mcg so that was placed until he gets the 118mcg. He is going to call the [place] pharmacy to see if they have the appropriate rx on file and will ask for a refill and if not he will call me and I will get provider to send it in.  Pt denies sob, no dizziness, no c/p. No edema noted. I advised pt to watch even more what he is eating and how much he is drinking since his torsemide was cut back even more.  He has not taken his meds yet this morning.   Marylouise Stacks, [place] [facility] Paramedic  07/11/19

## 2019-07-11 NOTE — Telephone Encounter (Signed)
Error in previous script directions for torsemide. Will refax when signed by MD.

## 2019-07-11 NOTE — Addendum Note (Signed)
Addended by: Valeda Malm on: 07/11/2019 08:17 AM   Modules accepted: Orders

## 2019-07-17 ENCOUNTER — Telehealth: Payer: Self-pay | Admitting: *Deleted

## 2019-07-17 NOTE — Telephone Encounter (Signed)
Home Monitoring alert received for 48 SVT episodes detected between Jul 01, 2019 1:50:02 AM and Jul 17, 2019 1:50:02 AM. Available EGM suggests conducted AT, 79min 25sec duration.   Spoke with patient. He reports he has been doing well. He denies any recent palpitations or heart racing, no other cardiac symptoms. Monitor has been disconnected in recent weeks and recently reconnected so HR histograms are not available. Atrial arrhythmia burden 0% since 03/22/19. Pt reports compliance with all medications, amiodarone d/c due to marked hypothyroidism per 05/28/19 OV note. Follows with [facility]. Advised I will send this information to Dr. Caryl Comes for review. Will call back if any new recommendations. Pt in agreement with plan.  Pt requests that new prescription for levothyroxine 130mcg be sent in to the [facility] pharmacy. Advised that per 07/11/19 note, pt is to call Katie with Paramedicine program and she will arrange for this. Pt agrees to call today. No further questions at this time.

## 2019-07-19 ENCOUNTER — Ambulatory Visit (HOSPITAL_COMMUNITY)
Admission: RE | Admit: 2019-07-19 | Discharge: 2019-07-19 | Disposition: A | Discharge status: Home / Self Care | Payer: No Typology Code available for payment source | Source: Ambulatory Visit | Attending: Internal Medicine | Admitting: Internal Medicine

## 2019-07-19 ENCOUNTER — Other Ambulatory Visit: Payer: Self-pay

## 2019-07-19 DIAGNOSIS — I5022 Chronic systolic (congestive) heart failure: Secondary | ICD-10-CM | POA: Insufficient documentation

## 2019-07-19 LAB — BASIC METABOLIC PANEL
Anion gap: 10 (ref 5–15)
BUN: 48 mg/dL — ABNORMAL HIGH (ref 8–23)
CO2: 24 mmol/L (ref 22–32)
Calcium: 9.2 mg/dL (ref 8.9–10.3)
Chloride: 103 mmol/L (ref 98–111)
Creatinine, Ser: 1.66 mg/dL — ABNORMAL HIGH (ref 0.61–1.24)
GFR calc Af Amer: 44 mL/min — ABNORMAL LOW (ref >60)
GFR calc non Af Amer: 38 mL/min — ABNORMAL LOW (ref >60)
Glucose, Bld: 99 mg/dL (ref 70–99)
Potassium: 5.1 mmol/L (ref 3.5–5.1)
Sodium: 137 mmol/L (ref 135–145)

## 2019-07-20 ENCOUNTER — Telehealth (HOSPITAL_COMMUNITY): Payer: Self-pay

## 2019-07-20 ENCOUNTER — Encounter (HOSPITAL_COMMUNITY): Payer: Self-pay

## 2019-07-20 NOTE — Telephone Encounter (Signed)
-----   Message from Larey Dresser, MD sent at 07/19/2019  5:16 PM EDT ----- Follow low K diet, stop any K supplement.  BMET 2 wks.

## 2019-07-20 NOTE — Telephone Encounter (Signed)
LVM regarding lab results, will mail letter with specific low K+ & high K+ foods. Has f/u 9/11 for repeat bmet

## 2019-07-21 NOTE — Telephone Encounter (Signed)
Ryan Macdonald Agree with atrial tach-- the third strip helped me :))) If he is asymptomatic and with overall low burden, would not make any changes Thanks SK

## 2019-08-01 ENCOUNTER — Other Ambulatory Visit (HOSPITAL_COMMUNITY): Payer: Self-pay

## 2019-08-01 ENCOUNTER — Telehealth: Payer: Self-pay

## 2019-08-01 ENCOUNTER — Telehealth (HOSPITAL_COMMUNITY): Payer: Self-pay | Admitting: Licensed Clinical Social Worker

## 2019-08-01 MED ORDER — LEVOTHYROXINE SODIUM 125 MCG PO TABS: 125.0000 ug | ORAL_TABLET | Freq: Every day | ORAL | 1 refills | Status: AC

## 2019-08-01 MED ORDER — LEVOTHYROXINE SODIUM 125 MCG PO TABS: 125.0000 ug | ORAL_TABLET | Freq: Every day | ORAL | 1 refills | Status: DC

## 2019-08-01 NOTE — Telephone Encounter (Signed)
RX printed and awaiting signature.

## 2019-08-01 NOTE — Progress Notes (Signed)
Paramedicine Encounter    Patient ID: Ryan Macdonald, male    DOB: August 13, 1934, 85 y.o.   MRN: [id number]   Patient Care Team: [facility], [facility] as PCP - General (General Practice) [name], MD as PCP - Cardiology (Cardiology) Laurey Morale, MD as PCP - Advanced Heart Failure (Cardiology)  Patient Active Problem List   Diagnosis Date Noted  . AKI (acute kidney injury) (HCC) 03/14/2019  . Spasm of muscle of lower back 03/14/2019  . Acute renal failure (ARF) (HCC) 03/09/2019  . Transaminitis   . Acute on chronic renal failure (HCC) 03/08/2019  . Acute exacerbation of CHF (congestive heart failure) (HCC) 11/23/2018  . Acute on chronic heart failure (HCC) 10/19/2018  . Pulmonary emphysema (HCC)   . Chronic respiratory failure with hypoxia (HCC)   . Morbid obesity (HCC) 09/09/2018  . URI (upper respiratory infection) 08/31/2018  . UTI (urinary tract infection) 08/31/2018  . Chronic systolic (congestive) heart failure (HCC) 08/28/2018  . CHF (congestive heart failure) (HCC) 08/14/2018  . Acute on chronic combined systolic and diastolic CHF (congestive heart failure) (HCC) 07/18/2018  . First degree AV block   . Shortness of breath 04/29/2018  . Acute HFrEF (heart failure with reduced ejection fraction) (HCC) 04/29/2018  . Acute respiratory failure with hypoxia (HCC) 09/27/2017  . CHF exacerbation (HCC) 09/27/2017  . Renal insufficiency   . Acute on chronic systolic CHF (congestive heart failure) (HCC) 07/08/2017  . Left knee pain 07/08/2017  . Acute bronchitis 05/10/2017  . Acute congestive heart failure (HCC) 04/14/2017  . Elevated troponin 04/14/2017  . Lactic acidosis   . Acute on chronic systolic heart failure, NYHA class 2 (HCC) 02/03/2017  . Diabetes mellitus type 2 in obese (HCC) 02/03/2017  . CAP (community acquired pneumonia) 02/03/2017  . Lobar pneumonia (HCC)   . Diabetes mellitus with complication (HCC)   . Acute on chronic respiratory failure with  hypoxia (HCC)   . Acute on chronic combined systolic and diastolic heart failure (HCC) 05/19/2016  . Depression 05/19/2016  . Gout 05/19/2016  . 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 NSVT (nonsustained ventricular tachycardia) (HCC)   . Chronic renal insufficiency, stage 3 (moderate) (HCC)   . Hyperkalemia 08/21/2015  . Pulmonary embolism (HCC)   . Acute respiratory failure (HCC)   . Osteoarthritis of left hip 10/25/2013  . Atrial fibrillation (HCC) 10/25/2013  . S/P hip replacement 10/24/2013  . Nonischemic cardiomyopathy (HCC)   . Obesity (BMI 30-39.9)   . Essential hypertension   . Hyperlipidemia     Current Outpatient Medications:  .  allopurinol (ZYLOPRIM) 100 MG tablet, Take 200 mg by mouth daily as needed (for gout symptoms). , Disp: , Rfl:  .  apixaban (ELIQUIS) 2.5 MG TABS tablet, Take 1 tablet (2.5 mg total) by mouth 2 (two) times daily., Disp: 90 tablet, Rfl: 0 .  cyanocobalamin 1000 MCG tablet, Take 1 tablet (1,000 mcg total) by mouth daily., Disp: 90 tablet, Rfl: 0 .  diclofenac sodium (VOLTAREN) 1 % GEL, Apply 4 g topically 4 (four) times daily., Disp: 1 Tube, Rfl: 0 .  docusate sodium (COLACE) 100 MG capsule, Take 100 mg by mouth 2 (two) times daily., Disp: , Rfl:  .  finasteride (PROSCAR) 5 MG tablet, Take 1 tablet (5 mg total) by mouth daily., Disp: 30 tablet, Rfl: 0 .  glipiZIDE (GLUCOTROL) 5 MG tablet, Take 5 mg by mouth daily before breakfast., Disp: , Rfl:  .  hydrALAZINE (APRESOLINE) 25 MG tablet,  Take 1.5 tablets (37.5 mg total) by mouth 3 (three) times daily., Disp: 405 tablet, Rfl: 3 .  HYDROcodone-acetaminophen (NORCO/VICODIN) 5-325 MG tablet, Take 2 tablets by mouth every 4 (four) hours as needed., Disp: 6 tablet, Rfl: 0 .  ipratropium-albuterol (DUONEB) 0.5-2.5 (3) MG/3ML SOLN, Take 3 mLs by nebulization every 4 (four) hours as needed., Disp: 360 mL, Rfl: 0 .  isosorbide mononitrate (IMDUR) 30 MG 24 hr tablet, Take 1 tablet (30  mg total) by mouth daily., Disp: 90 tablet, Rfl: 3 .  ketoconazole (NIZORAL) 2 % shampoo, Apply 1 application topically 2 (two) times a week., Disp: , Rfl:  .  levothyroxine (SYNTHROID) 125 MCG tablet, Take 1 tablet (125 mcg total) by mouth daily before breakfast. (Patient taking differently: Take 125 mcg by mouth daily before breakfast. ), Disp: 90 tablet, Rfl: 1 .  magnesium oxide (MAG-OX) 400 MG tablet, Take 400 mg by mouth daily., Disp: , Rfl:  .  methocarbamol (ROBAXIN) 500 MG tablet, Take 1 tablet (500 mg total) by mouth every 6 (six) hours as needed for muscle spasms., Disp: 30 tablet, Rfl: 0 .  rosuvastatin (CRESTOR) 10 MG tablet, Take 1 tablet (10 mg total) by mouth daily at 6 PM., Disp: 30 tablet, Rfl: 5 .  sertraline (ZOLOFT) 25 MG tablet, Take 25 mg by mouth daily. , Disp: , Rfl:  .  spironolactone (ALDACTONE) 25 MG tablet, Take 1 tablet (25 mg total) by mouth daily., Disp: 30 tablet, Rfl: 6 .  tamsulosin (FLOMAX) 0.4 MG CAPS capsule, Take 0.4 mg by mouth at bedtime., Disp: , Rfl:  .  torsemide (DEMADEX) 20 MG tablet, Take 1 tablet (20 mg total) by mouth daily., Disp: 90 tablet, Rfl: 3 .  amiodarone (PACERONE) 200 MG tablet, Take 1 tablet (200 mg total) by mouth daily. (Patient not taking: Reported on 05/09/2019), Disp: 30 tablet, Rfl: 3 .  nitroGLYCERIN (NITROSTAT) 0.4 MG SL tablet, Place 1 tablet (0.4 mg total) under the tongue every 5 (five) minutes x 3 doses as needed for chest pain. (Patient not taking: Reported on 06/20/2019), Disp: 25 tablet, Rfl: 0 Allergies  Allergen Reactions  . Lisinopril Swelling    Facial swelling  . Xarelto [Rivaroxaban] Other (See Comments)    "Blood came out of my penis"      Social History   Socioeconomic History  . Marital status: Married    Spouse name: Not on file  . Number of children: Not on file  . Years of education: Not on file  . Highest education level: Not on file  Occupational History  . Occupation: Retired    Comment: Former Patent examiner  . Financial resource strain: Not on file  . Food insecurity    Worry: Not on file    Inability: Not on file  . Transportation needs    Medical: Not on file    Non-medical: Not on file  Tobacco Use  . Smoking status: Former Smoker    Packs/day: 1.00    Years: 15.00    Pack years: 15.00    Types: Cigarettes, Cigars  . Smokeless tobacco: Former Neurosurgeon  . Tobacco comment: quit smoking ~ 50 yr ago  Substance and Sexual Activity  . Alcohol use: No    Alcohol/week: 2.0 standard drinks    Types: 2 Cans of beer per week    Comment: rare beer.  . Drug use: No  . Sexual activity: Yes  Lifestyle  . Physical activity    Days per  week: Not on file    Minutes per session: Not on file  . Stress: Not on file  Relationships  . Social Herbalist on phone: Not on file    Gets together: Not on file    Attends religious service: Not on file    Active member of club or organization: Not on file    Attends meetings of clubs or organizations: Not on file    Relationship status: Not on file  . Intimate partner violence    Fear of current or ex partner: No    Emotionally abused: No    Physically abused: No    Forced sexual activity: No  Other Topics Concern  . Not on file  Social History Narrative   Lives in [place] with wife. Moved from [place] (2013). Does not routinely exercise.    Physical Exam      Future Appointments  Date Time Provider [place]  08/10/2019  2:20 PM Larey Dresser, MD [facility] None  09/21/2019  7:35 AM [facility] DEVICE REMOTES [facility]  10/02/2019 11:00 AM Philemon Kingdom, MD [facility] None  12/21/2019  7:35 AM [facility] DEVICE REMOTES [facility]  03/21/2020  7:35 AM [facility] DEVICE REMOTES [facility]  06/20/2020  7:35 AM [facility] DEVICE REMOTES [facility]  09/19/2020  7:35 AM [facility] DEVICE REMOTES [facility]    BP 108/76   Pulse 86    Temp 97.7 F (36.5 C)   Resp 16   Wt 185 lb (83.9 kg)   SpO2 98%   BMI 28.98 kg/m   Weight yesterday-189 Last visit weight-189  Pt reports he has been feeling well, he was seen at PCP in [place] last week, no changes were made to his meds.  Pt denies sob, no dizziness, no c/p. He was a little discouraged when he got a list of meds that he thought he couldn't eat--I explained that everything needed to be in moderation--he still could eat some of those foods but as listed for half cup each day.  meds verified and 3 pill boxes filled.  He does not have the 125mg   Apparently his rx has to go through the PCP office to be approved through the pharmacy to be filled.  Fax--[phone] and attn it to dr Renelda Mom  Pt reports he got a call last week from defib clinic stating his defib went off 12 times and if he was ok, he reports not feeling it go off.  Reported to clinic and spoke to jazz and she advised it was an out of rhythm for 12 min of duration event not defib event.  I did a 12 lead, showed afib, also gave to jazz too.    Marylouise Stacks, [place] [facility] Paramedic  08/01/19

## 2019-08-01 NOTE — Telephone Encounter (Signed)
CSW reached out to pt to check in regarding food and medication status at this time.  Pt reports that he has everything he needs at this time.  Pt request CSW confirm appt time with him for next week- CSW relayed appt information to pt.  CSW encouraged pt to reach out with any concerns and will continue to follow and assist as needed  Jorge Ny, [place] Worker [facility] [phone]

## 2019-08-01 NOTE — Telephone Encounter (Signed)
-----   Message from Marylouise Stacks, EMT sent at 08/01/2019  9:57 AM EDT ----- Good morning Lenna Sciara,   Mr. Deere still has not received his 111mcg of levothyroxine from [facility] office.  Apparently it has to be faxed to his PCP Dr. Renelda Mom and then she approves the rx to be filled at the pharmacy. Could you take care of that please?  He is still taking the 164mcg dosage right now.   Fax number--[phone] and attn it to Dr. Renelda Mom    Thank you!  Marylouise Stacks, EMT-Paramedic  08/01/19

## 2019-08-02 NOTE — Telephone Encounter (Signed)
RX has been faxed.

## 2019-08-10 ENCOUNTER — Ambulatory Visit (HOSPITAL_COMMUNITY)
Admission: RE | Admit: 2019-08-10 | Discharge: 2019-08-10 | Disposition: A | Discharge status: Home / Self Care | Payer: No Typology Code available for payment source | Source: Ambulatory Visit | Attending: Cardiology | Admitting: Cardiology

## 2019-08-10 ENCOUNTER — Other Ambulatory Visit: Payer: Self-pay

## 2019-08-10 VITALS — BP 119/70 | HR 78 | Wt 208.3 lb

## 2019-08-10 DIAGNOSIS — Z79890 Hormone replacement therapy (postmenopausal): Secondary | ICD-10-CM | POA: Insufficient documentation

## 2019-08-10 DIAGNOSIS — I2510 Atherosclerotic heart disease of native coronary artery without angina pectoris: Secondary | ICD-10-CM | POA: Insufficient documentation

## 2019-08-10 DIAGNOSIS — Z955 Presence of coronary angioplasty implant and graft: Secondary | ICD-10-CM | POA: Diagnosis not present

## 2019-08-10 DIAGNOSIS — I428 Other cardiomyopathies: Secondary | ICD-10-CM | POA: Diagnosis not present

## 2019-08-10 DIAGNOSIS — I480 Paroxysmal atrial fibrillation: Secondary | ICD-10-CM | POA: Insufficient documentation

## 2019-08-10 DIAGNOSIS — Z7901 Long term (current) use of anticoagulants: Secondary | ICD-10-CM | POA: Diagnosis not present

## 2019-08-10 DIAGNOSIS — Z7984 Long term (current) use of oral hypoglycemic drugs: Secondary | ICD-10-CM | POA: Diagnosis not present

## 2019-08-10 DIAGNOSIS — E1122 Type 2 diabetes mellitus with diabetic chronic kidney disease: Secondary | ICD-10-CM | POA: Diagnosis not present

## 2019-08-10 DIAGNOSIS — E039 Hypothyroidism, unspecified: Secondary | ICD-10-CM | POA: Insufficient documentation

## 2019-08-10 DIAGNOSIS — I130 Hypertensive heart and chronic kidney disease with heart failure and stage 1 through stage 4 chronic kidney disease, or unspecified chronic kidney disease: Secondary | ICD-10-CM | POA: Diagnosis present

## 2019-08-10 DIAGNOSIS — Z79899 Other long term (current) drug therapy: Secondary | ICD-10-CM | POA: Diagnosis not present

## 2019-08-10 DIAGNOSIS — Z8249 Family history of ischemic heart disease and other diseases of the circulatory system: Secondary | ICD-10-CM | POA: Diagnosis not present

## 2019-08-10 DIAGNOSIS — M109 Gout, unspecified: Secondary | ICD-10-CM | POA: Diagnosis not present

## 2019-08-10 DIAGNOSIS — N183 Chronic kidney disease, stage 3 (moderate): Secondary | ICD-10-CM | POA: Insufficient documentation

## 2019-08-10 DIAGNOSIS — Z9981 Dependence on supplemental oxygen: Secondary | ICD-10-CM | POA: Diagnosis not present

## 2019-08-10 DIAGNOSIS — J9611 Chronic respiratory failure with hypoxia: Secondary | ICD-10-CM | POA: Insufficient documentation

## 2019-08-10 DIAGNOSIS — Z95810 Presence of automatic (implantable) cardiac defibrillator: Secondary | ICD-10-CM | POA: Diagnosis not present

## 2019-08-10 DIAGNOSIS — Z87891 Personal history of nicotine dependence: Secondary | ICD-10-CM | POA: Diagnosis not present

## 2019-08-10 DIAGNOSIS — Z791 Long term (current) use of non-steroidal anti-inflammatories (NSAID): Secondary | ICD-10-CM | POA: Insufficient documentation

## 2019-08-10 DIAGNOSIS — E785 Hyperlipidemia, unspecified: Secondary | ICD-10-CM | POA: Diagnosis not present

## 2019-08-10 DIAGNOSIS — R319 Hematuria, unspecified: Secondary | ICD-10-CM | POA: Insufficient documentation

## 2019-08-10 DIAGNOSIS — I5022 Chronic systolic (congestive) heart failure: Secondary | ICD-10-CM | POA: Insufficient documentation

## 2019-08-10 LAB — URINALYSIS, COMPLETE (UACMP) WITH MICROSCOPIC
Bilirubin Urine: NEGATIVE
Glucose, UA: NEGATIVE mg/dL
Ketones, ur: NEGATIVE mg/dL
Nitrite: NEGATIVE
Protein, ur: NEGATIVE mg/dL
RBC / HPF: >50 RBC/hpf — ABNORMAL HIGH (ref 0–5)
Specific Gravity, Urine: 1.006 (ref 1.005–1.030)
WBC, UA: >50 WBC/hpf — ABNORMAL HIGH (ref 0–5)
pH: 5 (ref 5.0–8.0)

## 2019-08-10 LAB — COMPREHENSIVE METABOLIC PANEL
ALT: 42 U/L (ref 0–44)
AST: 67 U/L — ABNORMAL HIGH (ref 15–41)
Albumin: 2.9 g/dL — ABNORMAL LOW (ref 3.5–5.0)
Alkaline Phosphatase: 75 U/L (ref 38–126)
Anion gap: 11 (ref 5–15)
BUN: 60 mg/dL — ABNORMAL HIGH (ref 8–23)
CO2: 21 mmol/L — ABNORMAL LOW (ref 22–32)
Calcium: 9.4 mg/dL (ref 8.9–10.3)
Chloride: 106 mmol/L (ref 98–111)
Creatinine, Ser: 1.86 mg/dL — ABNORMAL HIGH (ref 0.61–1.24)
GFR calc Af Amer: 38 mL/min — ABNORMAL LOW (ref >60)
GFR calc non Af Amer: 33 mL/min — ABNORMAL LOW (ref >60)
Glucose, Bld: 82 mg/dL (ref 70–99)
Potassium: 4.4 mmol/L (ref 3.5–5.1)
Sodium: 138 mmol/L (ref 135–145)
Total Bilirubin: 0.6 mg/dL (ref 0.3–1.2)
Total Protein: 7.7 g/dL (ref 6.5–8.1)

## 2019-08-10 LAB — CBC
HCT: 32.8 % — ABNORMAL LOW (ref 39.0–52.0)
Hemoglobin: 10.9 g/dL — ABNORMAL LOW (ref 13.0–17.0)
MCH: 33.2 pg (ref 26.0–34.0)
MCHC: 33.2 g/dL (ref 30.0–36.0)
MCV: 100 fL (ref 80.0–100.0)
Platelets: 250 10*3/uL (ref 150–400)
RBC: 3.28 MIL/uL — ABNORMAL LOW (ref 4.22–5.81)
RDW: 15.3 % (ref 11.5–15.5)
WBC: 6 10*3/uL (ref 4.0–10.5)
nRBC: 0 % (ref 0.0–0.2)

## 2019-08-10 MED ORDER — TORSEMIDE 20 MG PO TABS: 40.0000 mg | ORAL_TABLET | Freq: Every day | ORAL | 3 refills | Status: DC

## 2019-08-10 MED ORDER — ISOSORBIDE MONONITRATE ER 60 MG PO TB24: 60.0000 mg | ORAL_TABLET | Freq: Every day | ORAL | 3 refills | Status: DC

## 2019-08-10 MED ORDER — HYDRALAZINE HCL 50 MG PO TABS: 50.0000 mg | ORAL_TABLET | Freq: Three times a day (TID) | ORAL | 3 refills | Status: DC

## 2019-08-10 NOTE — Patient Instructions (Addendum)
INCREASE Hydralazine to 50mg  (1 tab) three times a day  INCREASE Imdur to 60mg  (1 tab) daily  INCREASE Torsemide to 40 mg (2 tabs) daily  Labs today and repeat in 10 days We will only contact you if something comes back abnormal or we need to make some changes. Otherwise no news is good news! Labs will be faxed to Dr Jodie Echevaria office  Your physician has recommended that you have a pulmonary function test. Pulmonary Function Tests are a group of tests that measure how well air moves in and out of your lungs.  You will get a call to schedule this appointment.   Your physician recommends that you schedule a follow-up appointment in: 1 month with Dr Aundra Dubin  At the [facility], you and your health needs are our priority. As part of our continuing mission to provide you with exceptional heart care, we have created designated Provider Care Teams. These Care Teams include your primary Cardiologist (physician) and Advanced Practice Providers (APPs- Physician Assistants and Nurse Practitioners) who all work together to provide you with the care you need, when you need it.   You may see any of the following providers on your designated Care Team at your next follow up: Marland Kitchen Dr Glori Bickers . Dr Loralie Champagne . Darrick Grinder, NP   Please be sure to bring in all your medications bottles to every appointment.

## 2019-08-10 NOTE — Progress Notes (Signed)
Prescriptions faxed to Dr Renelda Mom at [address] [phone].  Also requested additional lab requisitions to be done locally. Results will be faxed to dr Renelda Mom. Ok per Dr Aundra Dubin

## 2019-08-12 NOTE — Progress Notes (Signed)
Date:  08/12/2019   ID:  [name], DOB January 29, 1934, MRN [id number]   Provider location: [place] Advanced Heart Failure Type of Visit: Established patient   PCP:  [facility], [facility]; Dr. Renelda Mom  Cardiologist:  Dr. Aundra Dubin   History of Present Illness: Martinez Boxx is a 85 y.o. male who has a history ofsystolic HF due toNICM, Biotronic ICD, A Fibon Eliquis, DMII, HTN,CAD s/p pLAD stent in 07/2018 with residual non-obstructive disease,chronic hypoxic respiratory failure on home O2 PRN,and depression.  Admitted 12/26 - 71/24/58 with A/C systolic CHF. Diuresed 21 lbs and meds adjusted as tolerated. Echo showed EF 20-25%. EP was consulted for RV pacing and ICD settings were adjusted => patient was reprogrammed to VDD, now with narrow QRS with pacing.   Amiodarone was stopped with marked hypothyroidism.   Patient returns for followup of CHF.  He has been doing well symptomatically, no significant exertional dyspnea.  Weight has been stable at home.  No chest pain.  No lightheadedness.  No orthopnea/PND.  He has had hematuria recently that is being worked up by his PCP.    ECG (personally reviewed): NSR, 1st degree AVB, diffuse T wave flattening  Labs (4/20): K 4.1, creatinine 2.23, LDL 58 Labs (6/20): TSH 43, K 4.6, creatinine 1.67 Labs (8/20): K 5.1, creatinine 1.66  PMH: 1. Atrial fibrillation: Paroxysmal. 2. CKD: Stage 3. 3. Type 2 diabetes 4. Hyperlipidemia 5. HTN 6. Nephrolithiasis 7. ?NAFLD 8. Gout  9. CAD: LHC (9/19) with 40% distal LM, 50% ostial LAD, 50% mid LAD, 75% proximal LAD treated with DES.  10. Chronic hypoxemic respiratory failure. Has home oxygen. ?COPD: Prior smoker.  11. CHF: EF low since 2014. Primarily nonischemic cardiomyopathy. Biotronik ICD. - Echo (12/19) with EF 20-25%, diffuse hypokinesis, moderate MR, mildly dilated RV with moderately decreased systolic function, PASP 39 mmHg.  - ACEI angioedema.  12.  Hypothyroidism   Current Outpatient Medications  Medication Sig Dispense Refill  . allopurinol (ZYLOPRIM) 100 MG tablet Take 200 mg by mouth daily as needed (for gout symptoms).     Marland Kitchen amiodarone (PACERONE) 200 MG tablet Take 1 tablet (200 mg total) by mouth daily. 30 tablet 3  . apixaban (ELIQUIS) 2.5 MG TABS tablet Take 1 tablet (2.5 mg total) by mouth 2 (two) times daily. 90 tablet 0  . cyanocobalamin 1000 MCG tablet Take 1 tablet (1,000 mcg total) by mouth daily. 90 tablet 0  . diclofenac sodium (VOLTAREN) 1 % GEL Apply 4 g topically 4 (four) times daily. 1 Tube 0  . docusate sodium (COLACE) 100 MG capsule Take 100 mg by mouth 2 (two) times daily.    . finasteride (PROSCAR) 5 MG tablet Take 1 tablet (5 mg total) by mouth daily. 30 tablet 0  . glipiZIDE (GLUCOTROL) 5 MG tablet Take 5 mg by mouth daily before breakfast.    . hydrALAZINE (APRESOLINE) 50 MG tablet Take 1 tablet (50 mg total) by mouth 3 (three) times daily. 270 tablet 3  . HYDROcodone-acetaminophen (NORCO/VICODIN) 5-325 MG tablet Take 2 tablets by mouth every 4 (four) hours as needed. 6 tablet 0  . ipratropium-albuterol (DUONEB) 0.5-2.5 (3) MG/3ML SOLN Take 3 mLs by nebulization every 4 (four) hours as needed. 360 mL 0  . isosorbide mononitrate (IMDUR) 60 MG 24 hr tablet Take 1 tablet (60 mg total) by mouth daily. 90 tablet 3  . ketoconazole (NIZORAL) 2 % shampoo Apply 1 application topically 2 (two) times a week.    . levothyroxine (  SYNTHROID) 125 MCG tablet Take 1 tablet (125 mcg total) by mouth daily before breakfast. 90 tablet 1  . magnesium oxide (MAG-OX) 400 MG tablet Take 400 mg by mouth daily.    . methocarbamol (ROBAXIN) 500 MG tablet Take 1 tablet (500 mg total) by mouth every 6 (six) hours as needed for muscle spasms. 30 tablet 0  . nitroGLYCERIN (NITROSTAT) 0.4 MG SL tablet Place 1 tablet (0.4 mg total) under the tongue every 5 (five) minutes x 3 doses as needed for chest pain. 25 tablet 0  . rosuvastatin (CRESTOR) 10  MG tablet Take 1 tablet (10 mg total) by mouth daily at 6 PM. 30 tablet 5  . sertraline (ZOLOFT) 25 MG tablet Take 25 mg by mouth daily.     Marland Kitchen. spironolactone (ALDACTONE) 25 MG tablet Take 1 tablet (25 mg total) by mouth daily. 30 tablet 6  . tamsulosin (FLOMAX) 0.4 MG CAPS capsule Take 0.4 mg by mouth at bedtime.    . torsemide (DEMADEX) 20 MG tablet Take 2 tablets (40 mg total) by mouth daily. 180 tablet 3   No current facility-administered medications for this encounter.     Allergies:   Lisinopril and Xarelto [rivaroxaban]   Social History:  The patient  reports that he has quit smoking. His smoking use included cigarettes and cigars. He has a 15.00 pack-year smoking history. He has quit using smokeless tobacco. He reports that he does not drink alcohol or use drugs.   Family History:  The patient's family history includes Heart Problems in his maternal grandmother; Heart disease in his mother; Other in an other family member.   ROS:  Please see the history of present illness.   All other systems are personally reviewed and negative.   Exam:  BP 119/70   Pulse 78   Wt 94.5 kg (208 lb 4.8 oz)   SpO2 97%   BMI 32.62 kg/m  General: NAD Neck: JVP 8-9 cm, no thyromegaly or thyroid nodule.  Lungs: Clear to auscultation bilaterally with normal respiratory effort. CV: Nondisplaced PMI.  Heart regular S1/S2, no S3/S4, no murmur.  2+ edema 1/2 to knees bilaterally.  No carotid bruit.  Normal pedal pulses.  Abdomen: Soft, nontender, no hepatosplenomegaly, no distention.  Skin: Intact without lesions or rashes.  Neurologic: Alert and oriented x 3.  Psych: Normal affect. Extremities: No clubbing or cyanosis.  HEENT: Normal.   Recent Labs: 11/23/2018: B Natriuretic Peptide 1,382.9 03/10/2019: Magnesium 1.6 06/13/2019: TSH 3.523 08/10/2019: ALT 42; BUN 60; Creatinine, Ser 1.86; Hemoglobin 10.9; Platelets 250; Potassium 4.4; Sodium 138  Personally reviewed   Wt Readings from Last 3  Encounters:  08/10/19 94.5 kg (208 lb 4.8 oz)  08/01/19 83.9 kg (185 lb)  07/11/19 85.7 kg (189 lb)      ASSESSMENT AND PLAN:  1. Chronic systolic CHF: Primarily nonischemic cardiomyopathy. EF has been low since at least 2014.  He has single chamber Biotronik ICD. QRS not wide enough for CRT.Settings adjusted in the past to prevent RV pacing in the face of a long 1st degree AV block, he is not pacing today.  Most recent echo in 12/19 with EF 20-25%, diffuse hypokinesis, moderate RV systolic dysfunction. NYHA class II symptoms.  Mild volume overload on exam.     - Increase torsemide to 40 mg daily. BMET today and again in 10 days.   - Continue spironolactone 25 mg qhs.   - Increase hydralazine to 50 mg tid today and Imdur to 60 mg daily.  Bidil not covered by [facility]. -Hold beta blocker with long 1st degree AVB to limit RV pacing.  -No ACEI or ARNI with angioedema on lisinopril. - Consider Cardiomems in the future to monitor volume.  2. CAD: S/p PCI 07/2018 to pLAD but CAD does not explain his cardiomyopathy.  No chest pain.  - He is off Plavix now, on Eliquis for his atrial fibrillation.  - Continue Crestor, good lipids 4/20.   3.Atrial fibrillation: Paroxysmal.  He is in NSR today.   - Continue Eliquis 2.5 mg bid (age, creatinine > 1.5).  - He is now off amiodarone with marked hypothyroidism.  4. CKD: Stage 3.  I will arrange for BMET.  5. Chronic hypoxemic respiratory failure: He is not currently using oxygen.  Prior smoker.  - I will arrange for PFTs.   6. Hypothyroidism: Suspect this is due to amiodarone.  Levoxyl per PCP.  7. Hematuria: PCP working up, will get UA today.  He is still on Eliquis, would like to continue.   Followup in 1 month.   Signed, [name], MD  08/12/2019  [facility] [address] [facility] [place] [address] [phone] (office) [phone] (fax)

## 2019-08-13 ENCOUNTER — Telehealth (HOSPITAL_COMMUNITY): Payer: Self-pay

## 2019-08-13 NOTE — Telephone Encounter (Signed)
-----   Message from Larey Dresser, MD sent at 08/11/2019  3:53 PM EDT ----- 10 days is ok

## 2019-08-13 NOTE — Telephone Encounter (Signed)
ALL lab results faxed to Dr Renelda Mom at the [facility] as requested.

## 2019-08-15 ENCOUNTER — Other Ambulatory Visit (HOSPITAL_COMMUNITY): Payer: Self-pay

## 2019-08-15 NOTE — Progress Notes (Signed)
Paramedicine Encounter    Patient ID: Ryan Macdonald, male    DOB: 08-20-1934, 85 y.o.   MRN: [id number]   Patient Care Team: Center, [facility] as PCP - General (General Practice) [name], MD as PCP - Cardiology (Cardiology) Laurey Morale, MD as PCP - Advanced Heart Failure (Cardiology)  Patient Active Problem List   Diagnosis Date Noted  . AKI (acute kidney injury) (HCC) 03/14/2019  . Spasm of muscle of lower back 03/14/2019  . Acute renal failure (ARF) (HCC) 03/09/2019  . Transaminitis   . Acute on chronic renal failure (HCC) 03/08/2019  . Acute exacerbation of CHF (congestive heart failure) (HCC) 11/23/2018  . Acute on chronic heart failure (HCC) 10/19/2018  . Pulmonary emphysema (HCC)   . Chronic respiratory failure with hypoxia (HCC)   . Morbid obesity (HCC) 09/09/2018  . URI (upper respiratory infection) 08/31/2018  . UTI (urinary tract infection) 08/31/2018  . Chronic systolic (congestive) heart failure (HCC) 08/28/2018  . CHF (congestive heart failure) (HCC) 08/14/2018  . Acute on chronic combined systolic and diastolic CHF (congestive heart failure) (HCC) 07/18/2018  . First degree AV block   . Shortness of breath 04/29/2018  . Acute HFrEF (heart failure with reduced ejection fraction) (HCC) 04/29/2018  . Acute respiratory failure with hypoxia (HCC) 09/27/2017  . CHF exacerbation (HCC) 09/27/2017  . Renal insufficiency   . Acute on chronic systolic CHF (congestive heart failure) (HCC) 07/08/2017  . Left knee pain 07/08/2017  . Acute bronchitis 05/10/2017  . Acute congestive heart failure (HCC) 04/14/2017  . Elevated troponin 04/14/2017  . Lactic acidosis   . Acute on chronic systolic heart failure, NYHA class 2 (HCC) 02/03/2017  . Diabetes mellitus type 2 in obese (HCC) 02/03/2017  . CAP (community acquired pneumonia) 02/03/2017  . Lobar pneumonia (HCC)   . Diabetes mellitus with complication (HCC)   . Acute on chronic respiratory failure with  hypoxia (HCC)   . Acute on chronic combined systolic and diastolic heart failure (HCC) 05/19/2016  . Depression 05/19/2016  . Gout 05/19/2016  . 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 NSVT (nonsustained ventricular tachycardia) (HCC)   . Chronic renal insufficiency, stage 3 (moderate) (HCC)   . Hyperkalemia 08/21/2015  . Pulmonary embolism (HCC)   . Acute respiratory failure (HCC)   . Osteoarthritis of left hip 10/25/2013  . Atrial fibrillation (HCC) 10/25/2013  . S/P hip replacement 10/24/2013  . Nonischemic cardiomyopathy (HCC)   . Obesity (BMI 30-39.9)   . Essential hypertension   . Hyperlipidemia     Current Outpatient Medications:  .  allopurinol (ZYLOPRIM) 100 MG tablet, Take 200 mg by mouth daily as needed (for gout symptoms). , Disp: , Rfl:  .  apixaban (ELIQUIS) 2.5 MG TABS tablet, Take 1 tablet (2.5 mg total) by mouth 2 (two) times daily., Disp: 90 tablet, Rfl: 0 .  cyanocobalamin 1000 MCG tablet, Take 1 tablet (1,000 mcg total) by mouth daily., Disp: 90 tablet, Rfl: 0 .  diclofenac sodium (VOLTAREN) 1 % GEL, Apply 4 g topically 4 (four) times daily., Disp: 1 Tube, Rfl: 0 .  docusate sodium (COLACE) 100 MG capsule, Take 100 mg by mouth 2 (two) times daily., Disp: , Rfl:  .  finasteride (PROSCAR) 5 MG tablet, Take 1 tablet (5 mg total) by mouth daily., Disp: 30 tablet, Rfl: 0 .  glipiZIDE (GLUCOTROL) 5 MG tablet, Take 5 mg by mouth daily before breakfast., Disp: , Rfl:  .  hydrALAZINE (APRESOLINE) 50 MG tablet,  Take 1 tablet (50 mg total) by mouth 3 (three) times daily., Disp: 270 tablet, Rfl: 3 .  HYDROcodone-acetaminophen (NORCO/VICODIN) 5-325 MG tablet, Take 2 tablets by mouth every 4 (four) hours as needed., Disp: 6 tablet, Rfl: 0 .  ipratropium-albuterol (DUONEB) 0.5-2.5 (3) MG/3ML SOLN, Take 3 mLs by nebulization every 4 (four) hours as needed., Disp: 360 mL, Rfl: 0 .  isosorbide mononitrate (IMDUR) 60 MG 24 hr tablet, Take 1 tablet (60 mg  total) by mouth daily., Disp: 90 tablet, Rfl: 3 .  ketoconazole (NIZORAL) 2 % shampoo, Apply 1 application topically 2 (two) times a week., Disp: , Rfl:  .  levothyroxine (SYNTHROID) 125 MCG tablet, Take 1 tablet (125 mcg total) by mouth daily before breakfast., Disp: 90 tablet, Rfl: 1 .  magnesium oxide (MAG-OX) 400 MG tablet, Take 400 mg by mouth daily., Disp: , Rfl:  .  methocarbamol (ROBAXIN) 500 MG tablet, Take 1 tablet (500 mg total) by mouth every 6 (six) hours as needed for muscle spasms., Disp: 30 tablet, Rfl: 0 .  rosuvastatin (CRESTOR) 10 MG tablet, Take 1 tablet (10 mg total) by mouth daily at 6 PM., Disp: 30 tablet, Rfl: 5 .  sertraline (ZOLOFT) 25 MG tablet, Take 25 mg by mouth daily. , Disp: , Rfl:  .  spironolactone (ALDACTONE) 25 MG tablet, Take 1 tablet (25 mg total) by mouth daily., Disp: 30 tablet, Rfl: 6 .  tamsulosin (FLOMAX) 0.4 MG CAPS capsule, Take 0.4 mg by mouth at bedtime., Disp: , Rfl:  .  torsemide (DEMADEX) 20 MG tablet, Take 2 tablets (40 mg total) by mouth daily., Disp: 180 tablet, Rfl: 3 .  amiodarone (PACERONE) 200 MG tablet, Take 1 tablet (200 mg total) by mouth daily. (Patient not taking: Reported on 08/15/2019), Disp: 30 tablet, Rfl: 3 .  nitroGLYCERIN (NITROSTAT) 0.4 MG SL tablet, Place 1 tablet (0.4 mg total) under the tongue every 5 (five) minutes x 3 doses as needed for chest pain. (Patient not taking: Reported on 08/15/2019), Disp: 25 tablet, Rfl: 0 Allergies  Allergen Reactions  . Lisinopril Swelling    Facial swelling  . Xarelto [Rivaroxaban] Other (See Comments)    "Blood came out of my penis"      Social History   Socioeconomic History  . Marital status: Married    Spouse name: Not on file  . Number of children: Not on file  . Years of education: Not on file  . Highest education level: Not on file  Occupational History  . Occupation: Retired    Comment: Former Patent examiner  . Financial resource strain: Not on file  . Food insecurity     Worry: Not on file    Inability: Not on file  . Transportation needs    Medical: Not on file    Non-medical: Not on file  Tobacco Use  . Smoking status: Former Smoker    Packs/day: 1.00    Years: 15.00    Pack years: 15.00    Types: Cigarettes, Cigars  . Smokeless tobacco: Former Neurosurgeon  . Tobacco comment: quit smoking ~ 50 yr ago  Substance and Sexual Activity  . Alcohol use: No    Alcohol/week: 2.0 standard drinks    Types: 2 Cans of beer per week    Comment: rare beer.  . Drug use: No  . Sexual activity: Yes  Lifestyle  . Physical activity    Days per week: Not on file    Minutes per session: Not on  file  . Stress: Not on file  Relationships  . Social Herbalist on phone: Not on file    Gets together: Not on file    Attends religious service: Not on file    Active member of club or organization: Not on file    Attends meetings of clubs or organizations: Not on file    Relationship status: Not on file  . Intimate partner violence    Fear of current or ex partner: No    Emotionally abused: No    Physically abused: No    Forced sexual activity: No  Other Topics Concern  . Not on file  Social History Narrative   Lives in [place] with wife. Moved from [place] (2013). Does not routinely exercise.    Physical Exam      Future Appointments  Date Time Provider [place]  08/21/2019 10:30 AM [facility] LAB [facility] None  09/18/2019 10:40 AM Larey Dresser, MD [facility] None  09/21/2019  7:35 AM [facility] DEVICE REMOTES [facility]  10/02/2019 11:00 AM Philemon Kingdom, MD [facility] None  12/21/2019  7:35 AM [facility] DEVICE REMOTES [facility]  03/21/2020  7:35 AM [facility] DEVICE REMOTES [facility]  06/20/2020  7:35 AM [facility] DEVICE REMOTES [facility]  09/19/2020  7:35 AM [facility] DEVICE REMOTES [facility]    BP 106/70   Pulse 96   Temp 97.9 F (36.6 C)   Resp  17   Wt 190 lb (86.2 kg)   SpO2 98%   BMI 29.76 kg/m   Weight yesterday-187 Last visit weight-185  Pt reports he is feeling good, at last clinic visit he had some med changes--his isosorbide was increased to 60mg , hydralazine to 50mg  BID, and torsemide increased to 40mg .  He reports he was able to take the additional torsemide but have not been taking the increased hydralazine and isosorbide yet.  He got the correct dose of levothyroxine so that was corrected and the increased doses of the others were done to his pill box. He requested I fill multiple pill boxes but he goes back to clinic for labs next Tuesday so I will wait until after that lab visit to fill multiple pill boxes. He states he is planning going out of town around first of October to get more teeth done. He states he will catch the bus if he goes, but wife may go and she may drive.   he does have pitting edema to his legs +3. He is busy on his feet a lot during the day doing odd and ends and errands.  He states he is keeping his fluid intake to less than 2L.  Will see him again next week after labs.   Marylouise Stacks, [facility] Paramedic  08/15/19

## 2019-08-21 ENCOUNTER — Ambulatory Visit (HOSPITAL_COMMUNITY)
Admission: RE | Admit: 2019-08-21 | Discharge: 2019-08-21 | Disposition: A | Discharge status: Home / Self Care | Payer: No Typology Code available for payment source | Source: Ambulatory Visit | Attending: Cardiology | Admitting: Cardiology

## 2019-08-21 ENCOUNTER — Telehealth (HOSPITAL_COMMUNITY): Payer: Self-pay

## 2019-08-21 ENCOUNTER — Other Ambulatory Visit: Payer: Self-pay

## 2019-08-21 DIAGNOSIS — I428 Other cardiomyopathies: Secondary | ICD-10-CM | POA: Diagnosis present

## 2019-08-21 DIAGNOSIS — I5022 Chronic systolic (congestive) heart failure: Secondary | ICD-10-CM

## 2019-08-21 LAB — BASIC METABOLIC PANEL
Anion gap: 10 (ref 5–15)
BUN: 73 mg/dL — ABNORMAL HIGH (ref 8–23)
CO2: 25 mmol/L (ref 22–32)
Calcium: 9.2 mg/dL (ref 8.9–10.3)
Chloride: 101 mmol/L (ref 98–111)
Creatinine, Ser: 2.1 mg/dL — ABNORMAL HIGH (ref 0.61–1.24)
GFR calc Af Amer: 33 mL/min — ABNORMAL LOW (ref >60)
GFR calc non Af Amer: 28 mL/min — ABNORMAL LOW (ref >60)
Glucose, Bld: 61 mg/dL — ABNORMAL LOW (ref 70–99)
Potassium: 4.5 mmol/L (ref 3.5–5.1)
Sodium: 136 mmol/L (ref 135–145)

## 2019-08-21 NOTE — Telephone Encounter (Signed)
-----   Message from Larey Dresser, MD sent at 08/21/2019  4:17 PM EDT ----- Creatinine is up, hold torsemide for 1 day then decrease to 20 mg daily.  BMET 1 week.

## 2019-08-22 ENCOUNTER — Other Ambulatory Visit (HOSPITAL_COMMUNITY): Payer: Self-pay

## 2019-08-22 NOTE — Progress Notes (Signed)
Came out today for med rec post lab visit yesterday. He said he skipped a day of his torsemide like the said.  He is aware of the next lab visit next week.  20mg  daily is placed in pill box. Will see him next week after lab visit.   Marylouise Stacks, EMT-Paramedic  08/23/19

## 2019-08-28 ENCOUNTER — Telehealth: Payer: Self-pay | Admitting: Emergency Medicine

## 2019-08-28 NOTE — Telephone Encounter (Signed)
LMOM.  Need to assess patient for symptoms due to alert for SVT episode  at 0328 am that 17 seconds long  And appeared to be AT at 171 bpm. Hx of the same in August.

## 2019-08-29 NOTE — Telephone Encounter (Signed)
Reviewed previous episodes in phone notes that were of longer duration that were reviewed by Dr Caryl Comes. Patient was asymptomatic with longer episodes  and Dr Caryl Comes did not have any orders to change treatment plan.

## 2019-08-30 ENCOUNTER — Ambulatory Visit (HOSPITAL_COMMUNITY)
Admission: RE | Admit: 2019-08-30 | Discharge: 2019-08-30 | Disposition: A | Discharge status: Home / Self Care | Payer: No Typology Code available for payment source | Source: Ambulatory Visit | Attending: Internal Medicine | Admitting: Internal Medicine

## 2019-08-30 ENCOUNTER — Telehealth (HOSPITAL_COMMUNITY): Payer: Self-pay

## 2019-08-30 ENCOUNTER — Other Ambulatory Visit: Payer: Self-pay

## 2019-08-30 DIAGNOSIS — I5022 Chronic systolic (congestive) heart failure: Secondary | ICD-10-CM | POA: Diagnosis present

## 2019-08-30 LAB — BASIC METABOLIC PANEL
Anion gap: 10 (ref 5–15)
BUN: 64 mg/dL — ABNORMAL HIGH (ref 8–23)
CO2: 19 mmol/L — ABNORMAL LOW (ref 22–32)
Calcium: 9 mg/dL (ref 8.9–10.3)
Chloride: 106 mmol/L (ref 98–111)
Creatinine, Ser: 1.89 mg/dL — ABNORMAL HIGH (ref 0.61–1.24)
GFR calc Af Amer: 37 mL/min — ABNORMAL LOW (ref >60)
GFR calc non Af Amer: 32 mL/min — ABNORMAL LOW (ref >60)
Glucose, Bld: 107 mg/dL — ABNORMAL HIGH (ref 70–99)
Potassium: 4.9 mmol/L (ref 3.5–5.1)
Sodium: 135 mmol/L (ref 135–145)

## 2019-08-30 NOTE — Telephone Encounter (Signed)
I called pt several times today regarding home visit.  There was no answer, I will hopefully follow up with him next week.   Marylouise Stacks, EMT-Paramedic  08/30/19

## 2019-09-05 ENCOUNTER — Other Ambulatory Visit (HOSPITAL_COMMUNITY): Payer: Self-pay

## 2019-09-05 NOTE — Progress Notes (Signed)
Paramedicine Encounter    Patient ID: Ryan Macdonald, male    DOB: 08-20-1934, 85 y.o.   MRN: [id number]   Patient Care Team: [facility], [facility] as PCP - General (General Practice) [name], MD as PCP - Cardiology (Cardiology) Laurey Morale, MD as PCP - Advanced Heart Failure (Cardiology)  Patient Active Problem List   Diagnosis Date Noted  . AKI (acute kidney injury) (HCC) 03/14/2019  . Spasm of muscle of lower back 03/14/2019  . Acute renal failure (ARF) (HCC) 03/09/2019  . Transaminitis   . Acute on chronic renal failure (HCC) 03/08/2019  . Acute exacerbation of CHF (congestive heart failure) (HCC) 11/23/2018  . Acute on chronic heart failure (HCC) 10/19/2018  . Pulmonary emphysema (HCC)   . Chronic respiratory failure with hypoxia (HCC)   . Morbid obesity (HCC) 09/09/2018  . URI (upper respiratory infection) 08/31/2018  . UTI (urinary tract infection) 08/31/2018  . Chronic systolic (congestive) heart failure (HCC) 08/28/2018  . CHF (congestive heart failure) (HCC) 08/14/2018  . Acute on chronic combined systolic and diastolic CHF (congestive heart failure) (HCC) 07/18/2018  . First degree AV block   . Shortness of breath 04/29/2018  . Acute HFrEF (heart failure with reduced ejection fraction) (HCC) 04/29/2018  . Acute respiratory failure with hypoxia (HCC) 09/27/2017  . CHF exacerbation (HCC) 09/27/2017  . Renal insufficiency   . Acute on chronic systolic CHF (congestive heart failure) (HCC) 07/08/2017  . Left knee pain 07/08/2017  . Acute bronchitis 05/10/2017  . Acute congestive heart failure (HCC) 04/14/2017  . Elevated troponin 04/14/2017  . Lactic acidosis   . Acute on chronic systolic heart failure, NYHA class 2 (HCC) 02/03/2017  . Diabetes mellitus type 2 in obese (HCC) 02/03/2017  . CAP (community acquired pneumonia) 02/03/2017  . Lobar pneumonia (HCC)   . Diabetes mellitus with complication (HCC)   . Acute on chronic respiratory failure with  hypoxia (HCC)   . Acute on chronic combined systolic and diastolic heart failure (HCC) 05/19/2016  . Depression 05/19/2016  . Gout 05/19/2016  . 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 NSVT (nonsustained ventricular tachycardia) (HCC)   . Chronic renal insufficiency, stage 3 (moderate) (HCC)   . Hyperkalemia 08/21/2015  . Pulmonary embolism (HCC)   . Acute respiratory failure (HCC)   . Osteoarthritis of left hip 10/25/2013  . Atrial fibrillation (HCC) 10/25/2013  . S/P hip replacement 10/24/2013  . Nonischemic cardiomyopathy (HCC)   . Obesity (BMI 30-39.9)   . Essential hypertension   . Hyperlipidemia     Current Outpatient Medications:  .  allopurinol (ZYLOPRIM) 100 MG tablet, Take 200 mg by mouth daily as needed (for gout symptoms). , Disp: , Rfl:  .  apixaban (ELIQUIS) 2.5 MG TABS tablet, Take 1 tablet (2.5 mg total) by mouth 2 (two) times daily., Disp: 90 tablet, Rfl: 0 .  cyanocobalamin 1000 MCG tablet, Take 1 tablet (1,000 mcg total) by mouth daily., Disp: 90 tablet, Rfl: 0 .  diclofenac sodium (VOLTAREN) 1 % GEL, Apply 4 g topically 4 (four) times daily., Disp: 1 Tube, Rfl: 0 .  docusate sodium (COLACE) 100 MG capsule, Take 100 mg by mouth 2 (two) times daily., Disp: , Rfl:  .  finasteride (PROSCAR) 5 MG tablet, Take 1 tablet (5 mg total) by mouth daily., Disp: 30 tablet, Rfl: 0 .  glipiZIDE (GLUCOTROL) 5 MG tablet, Take 5 mg by mouth daily before breakfast., Disp: , Rfl:  .  hydrALAZINE (APRESOLINE) 50 MG tablet,  Take 1 tablet (50 mg total) by mouth 3 (three) times daily., Disp: 270 tablet, Rfl: 3 .  HYDROcodone-acetaminophen (NORCO/VICODIN) 5-325 MG tablet, Take 2 tablets by mouth every 4 (four) hours as needed., Disp: 6 tablet, Rfl: 0 .  ipratropium-albuterol (DUONEB) 0.5-2.5 (3) MG/3ML SOLN, Take 3 mLs by nebulization every 4 (four) hours as needed., Disp: 360 mL, Rfl: 0 .  isosorbide mononitrate (IMDUR) 60 MG 24 hr tablet, Take 1 tablet (60 mg  total) by mouth daily., Disp: 90 tablet, Rfl: 3 .  ketoconazole (NIZORAL) 2 % shampoo, Apply 1 application topically 2 (two) times a week., Disp: , Rfl:  .  levothyroxine (SYNTHROID) 125 MCG tablet, Take 1 tablet (125 mcg total) by mouth daily before breakfast., Disp: 90 tablet, Rfl: 1 .  magnesium oxide (MAG-OX) 400 MG tablet, Take 400 mg by mouth daily., Disp: , Rfl:  .  methocarbamol (ROBAXIN) 500 MG tablet, Take 1 tablet (500 mg total) by mouth every 6 (six) hours as needed for muscle spasms., Disp: 30 tablet, Rfl: 0 .  rosuvastatin (CRESTOR) 10 MG tablet, Take 1 tablet (10 mg total) by mouth daily at 6 PM., Disp: 30 tablet, Rfl: 5 .  sertraline (ZOLOFT) 25 MG tablet, Take 25 mg by mouth daily. , Disp: , Rfl:  .  spironolactone (ALDACTONE) 25 MG tablet, Take 1 tablet (25 mg total) by mouth daily., Disp: 30 tablet, Rfl: 6 .  tamsulosin (FLOMAX) 0.4 MG CAPS capsule, Take 0.4 mg by mouth at bedtime., Disp: , Rfl:  .  torsemide (DEMADEX) 20 MG tablet, Take 2 tablets (40 mg total) by mouth daily. (Patient taking differently: Take 20 mg by mouth daily. ), Disp: 180 tablet, Rfl: 3 .  amiodarone (PACERONE) 200 MG tablet, Take 1 tablet (200 mg total) by mouth daily. (Patient not taking: Reported on 08/15/2019), Disp: 30 tablet, Rfl: 3 .  nitroGLYCERIN (NITROSTAT) 0.4 MG SL tablet, Place 1 tablet (0.4 mg total) under the tongue every 5 (five) minutes x 3 doses as needed for chest pain. (Patient not taking: Reported on 08/15/2019), Disp: 25 tablet, Rfl: 0 Allergies  Allergen Reactions  . Lisinopril Swelling    Facial swelling  . Xarelto [Rivaroxaban] Other (See Comments)    "Blood came out of my penis"      Social History   Socioeconomic History  . Marital status: Married    Spouse name: Not on file  . Number of children: Not on file  . Years of education: Not on file  . Highest education level: Not on file  Occupational History  . Occupation: Retired    Comment: Former Education officer, museum  .  Financial resource strain: Not on file  . Food insecurity    Worry: Not on file    Inability: Not on file  . Transportation needs    Medical: Not on file    Non-medical: Not on file  Tobacco Use  . Smoking status: Former Smoker    Packs/day: 1.00    Years: 15.00    Pack years: 15.00    Types: Cigarettes, Cigars  . Smokeless tobacco: Former Systems developer  . Tobacco comment: quit smoking ~ 50 yr ago  Substance and Sexual Activity  . Alcohol use: No    Alcohol/week: 2.0 standard drinks    Types: 2 Cans of beer per week    Comment: rare beer.  . Drug use: No  . Sexual activity: Yes  Lifestyle  . Physical activity    Days per week: Not  on file    Minutes per session: Not on file  . Stress: Not on file  Relationships  . Social Musician on phone: Not on file    Gets together: Not on file    Attends religious service: Not on file    Active member of club or organization: Not on file    Attends meetings of clubs or organizations: Not on file    Relationship status: Not on file  . Intimate partner violence    Fear of current or ex partner: No    Emotionally abused: No    Physically abused: No    Forced sexual activity: No  Other Topics Concern  . Not on file  Social History Narrative   Lives in [place] with wife. Moved from [place] (2013). Does not routinely exercise.    Physical Exam      Future Appointments  Date Time Provider Department Center  09/18/2019 10:40 AM Laurey Morale, MD [facility] None  09/21/2019  7:35 AM [facility] DEVICE REMOTES [facility]  10/02/2019 11:00 AM Carlus Pavlov, MD [facility] None  12/21/2019  7:35 AM [facility] DEVICE REMOTES [facility]  03/21/2020  7:35 AM [facility] DEVICE REMOTES [facility]  06/20/2020  7:35 AM [facility] DEVICE REMOTES [facility]  09/19/2020  7:35 AM [facility] DEVICE REMOTES [facility]    BP 112/68   Pulse 96   Temp 97.7 F (36.5  C)   Resp 16   SpO2 97%   Weight yesterday-190 Last visit weight-190 CBG PTA-116  Pt reports he is doing ok. He denies increased sob, no dizziness. No c/p. No h/a. No palpitations noted.  He reports intermittent urine issues--a few drops at times and that's it and it occurs maybe once a day-sporatic in times.  meds verified and pill box refilled.  He didn't weigh today.  He needs the hydralazine and isosorbide refilled to be sent to [facility] and it has to be sent to dr Tereasa Coop office first.  He does have slight edema to his feet.   [name], EMT-Paramedic [phone] [facility] Paramedic  09/05/19

## 2019-09-18 ENCOUNTER — Encounter (HOSPITAL_COMMUNITY): Payer: Self-pay | Admitting: Cardiology

## 2019-09-18 ENCOUNTER — Ambulatory Visit (HOSPITAL_COMMUNITY)
Admission: RE | Admit: 2019-09-18 | Discharge: 2019-09-18 | Disposition: A | Discharge status: Home / Self Care | Payer: No Typology Code available for payment source | Source: Ambulatory Visit | Attending: Cardiology | Admitting: Cardiology

## 2019-09-18 ENCOUNTER — Other Ambulatory Visit (HOSPITAL_COMMUNITY): Payer: Self-pay

## 2019-09-18 ENCOUNTER — Other Ambulatory Visit: Payer: Self-pay

## 2019-09-18 VITALS — BP 107/47 | HR 77 | Wt 212.8 lb

## 2019-09-18 DIAGNOSIS — I2510 Atherosclerotic heart disease of native coronary artery without angina pectoris: Secondary | ICD-10-CM | POA: Insufficient documentation

## 2019-09-18 DIAGNOSIS — J9611 Chronic respiratory failure with hypoxia: Secondary | ICD-10-CM | POA: Insufficient documentation

## 2019-09-18 DIAGNOSIS — E1122 Type 2 diabetes mellitus with diabetic chronic kidney disease: Secondary | ICD-10-CM | POA: Diagnosis not present

## 2019-09-18 DIAGNOSIS — Z95810 Presence of automatic (implantable) cardiac defibrillator: Secondary | ICD-10-CM | POA: Insufficient documentation

## 2019-09-18 DIAGNOSIS — Z79890 Hormone replacement therapy (postmenopausal): Secondary | ICD-10-CM | POA: Insufficient documentation

## 2019-09-18 DIAGNOSIS — Z8249 Family history of ischemic heart disease and other diseases of the circulatory system: Secondary | ICD-10-CM | POA: Diagnosis not present

## 2019-09-18 DIAGNOSIS — E785 Hyperlipidemia, unspecified: Secondary | ICD-10-CM | POA: Insufficient documentation

## 2019-09-18 DIAGNOSIS — I872 Venous insufficiency (chronic) (peripheral): Secondary | ICD-10-CM | POA: Diagnosis not present

## 2019-09-18 DIAGNOSIS — Z79899 Other long term (current) drug therapy: Secondary | ICD-10-CM | POA: Insufficient documentation

## 2019-09-18 DIAGNOSIS — Z791 Long term (current) use of non-steroidal anti-inflammatories (NSAID): Secondary | ICD-10-CM | POA: Insufficient documentation

## 2019-09-18 DIAGNOSIS — Z7901 Long term (current) use of anticoagulants: Secondary | ICD-10-CM | POA: Diagnosis not present

## 2019-09-18 DIAGNOSIS — E039 Hypothyroidism, unspecified: Secondary | ICD-10-CM | POA: Diagnosis not present

## 2019-09-18 DIAGNOSIS — I130 Hypertensive heart and chronic kidney disease with heart failure and stage 1 through stage 4 chronic kidney disease, or unspecified chronic kidney disease: Secondary | ICD-10-CM | POA: Diagnosis not present

## 2019-09-18 DIAGNOSIS — M109 Gout, unspecified: Secondary | ICD-10-CM | POA: Insufficient documentation

## 2019-09-18 DIAGNOSIS — Z888 Allergy status to other drugs, medicaments and biological substances status: Secondary | ICD-10-CM | POA: Diagnosis not present

## 2019-09-18 DIAGNOSIS — Z955 Presence of coronary angioplasty implant and graft: Secondary | ICD-10-CM | POA: Insufficient documentation

## 2019-09-18 DIAGNOSIS — Z7984 Long term (current) use of oral hypoglycemic drugs: Secondary | ICD-10-CM | POA: Insufficient documentation

## 2019-09-18 DIAGNOSIS — I5022 Chronic systolic (congestive) heart failure: Secondary | ICD-10-CM

## 2019-09-18 DIAGNOSIS — I428 Other cardiomyopathies: Secondary | ICD-10-CM | POA: Diagnosis not present

## 2019-09-18 DIAGNOSIS — N183 Chronic kidney disease, stage 3 unspecified: Secondary | ICD-10-CM | POA: Diagnosis not present

## 2019-09-18 DIAGNOSIS — Z87891 Personal history of nicotine dependence: Secondary | ICD-10-CM | POA: Insufficient documentation

## 2019-09-18 DIAGNOSIS — I480 Paroxysmal atrial fibrillation: Secondary | ICD-10-CM

## 2019-09-18 LAB — BASIC METABOLIC PANEL
Anion gap: 8 (ref 5–15)
BUN: 50 mg/dL — ABNORMAL HIGH (ref 8–23)
CO2: 22 mmol/L (ref 22–32)
Calcium: 9.2 mg/dL (ref 8.9–10.3)
Chloride: 107 mmol/L (ref 98–111)
Creatinine, Ser: 1.52 mg/dL — ABNORMAL HIGH (ref 0.61–1.24)
GFR calc Af Amer: 48 mL/min — ABNORMAL LOW (ref >60)
GFR calc non Af Amer: 42 mL/min — ABNORMAL LOW (ref >60)
Glucose, Bld: 102 mg/dL — ABNORMAL HIGH (ref 70–99)
Potassium: 5 mmol/L (ref 3.5–5.1)
Sodium: 137 mmol/L (ref 135–145)

## 2019-09-18 LAB — LIPID PANEL
Cholesterol: 109 mg/dL (ref 0–200)
HDL: 32 mg/dL — ABNORMAL LOW (ref >40)
LDL Cholesterol: 65 mg/dL (ref 0–99)
Total CHOL/HDL Ratio: 3.4 RATIO
Triglycerides: 61 mg/dL (ref <150)
VLDL: 12 mg/dL (ref 0–40)

## 2019-09-18 LAB — CBC
HCT: 31.7 % — ABNORMAL LOW (ref 39.0–52.0)
Hemoglobin: 10.4 g/dL — ABNORMAL LOW (ref 13.0–17.0)
MCH: 33.7 pg (ref 26.0–34.0)
MCHC: 32.8 g/dL (ref 30.0–36.0)
MCV: 102.6 fL — ABNORMAL HIGH (ref 80.0–100.0)
Platelets: 215 10*3/uL (ref 150–400)
RBC: 3.09 MIL/uL — ABNORMAL LOW (ref 4.22–5.81)
RDW: 14.7 % (ref 11.5–15.5)
WBC: 6 10*3/uL (ref 4.0–10.5)
nRBC: 0 % (ref 0.0–0.2)

## 2019-09-18 MED ORDER — ISOSORBIDE MONONITRATE ER 60 MG PO TB24: 60.0000 mg | ORAL_TABLET | Freq: Every day | ORAL | 3 refills | Status: DC

## 2019-09-18 MED ORDER — HYDRALAZINE HCL 50 MG PO TABS: 50.0000 mg | ORAL_TABLET | Freq: Three times a day (TID) | ORAL | 3 refills | Status: DC

## 2019-09-18 NOTE — Progress Notes (Signed)
Paramedicine Encounter   Patient ID: Ryan Macdonald , male,   DOB: 19-Jun-1934,85 y.o.,  MRN: [id number]   Met patient in clinic today with provider.   B/p-107/47 p-77 Weight @ clinic-211 sp02-96%  Pt reports he is doing good, he expressed some issues at home for family stuff with his daughter still living there and not paying anything.  He does have swelling to his feet today. He denies sob.  He has not been using his compression stockings. He will start to wear them daily.  No med changes today. I will go out later today to fill pill box.   -----I went by later this evening and he was not home--he called later on and said he was out and I could come by tomor--  Marylouise Stacks, [place] 09/18/2019

## 2019-09-18 NOTE — Progress Notes (Signed)
rx for hydralazine and imdur faxed to [facility]

## 2019-09-18 NOTE — Patient Instructions (Signed)
No medication changes  Labs today We will only contact you if something comes back abnormal or we need to make some changes. Otherwise no news is good news!  Your physician recommends that you schedule a follow-up appointment in: 3 months with Dr Aundra Dubin  At the [facility], you and your health needs are our priority. As part of our continuing mission to provide you with exceptional heart care, we have created designated Provider Care Teams. These Care Teams include your primary Cardiologist (physician) and Advanced Practice Providers (APPs- Physician Assistants and Nurse Practitioners) who all work together to provide you with the care you need, when you need it.   You may see any of the following providers on your designated Care Team at your next follow up: Marland Kitchen Dr Glori Bickers . Dr Loralie Champagne . Darrick Grinder, NP . Lyda Jester, PA   Please be sure to bring in all your medications bottles to every appointment.

## 2019-09-18 NOTE — Progress Notes (Signed)
Date:  09/18/2019   ID:  Ryan Macdonald, DOB [birth year], MRN [id number]   Provider location: [place] [facility] Type of Visit: Established patient   PCP:  Center, [facility]; Dr. Tereasa Coop  Cardiologist:  Dr. Shirlee Latch   History of Present Illness: Ryan Macdonald is a 85 y.o. male who has a history ofsystolic HF due toNICM, Biotronic ICD, A Fibon Eliquis, DMII, HTN,CAD s/p pLAD stent in 07/2018 with residual non-obstructive disease,chronic hypoxic respiratory failure on home O2 PRN,and depression.  Admitted 12/26 - 11/28/18 with A/C systolic CHF. Diuresed 21 lbs and meds adjusted as tolerated. Echo showed EF 20-25%. EP was consulted for RV pacing and ICD settings were adjusted => patient was reprogrammed to VDD, now with narrow QRS with pacing.   Amiodarone was stopped with marked hypothyroidism.   Torsemide was increased after last appointment then decreased again due to rise in creatinine.   Patient returns for followup of CHF.  He feels like he has been doing well.  Has oxygen at home but not using it.  No dyspnea walking on flat ground.  No orthopnea/PND.  No chest pain.   Biotronik device interrogation: 0% RV pacing, no VT, no AF  Labs (4/20): K 4.1, creatinine 2.23, LDL 58 Labs (6/20): TSH 43, K 4.6, creatinine 1.67 Labs (8/20): K 5.1, creatinine 1.66 Labs (10/20): K 4.9, creatinine 1.89  PMH: 1. Atrial fibrillation: Paroxysmal. 2. CKD: Stage 3. 3. Type 2 diabetes 4. Hyperlipidemia 5. HTN 6. Nephrolithiasis 7. ?NAFLD 8. Gout  9. CAD: LHC (9/19) with 40% distal LM, 50% ostial LAD, 50% mid LAD, 75% proximal LAD treated with DES.  10. Chronic hypoxemic respiratory failure. Has home oxygen. ?COPD: Prior smoker.  11. CHF: EF low since 2014. Primarily nonischemic cardiomyopathy. Biotronik ICD. - Echo (12/19) with EF 20-25%, diffuse hypokinesis, moderate MR, mildly dilated RV with moderately decreased systolic function, PASP 39 mmHg.  - ACEI  angioedema.  12. Hypothyroidism   Current Outpatient Medications  Medication Sig Dispense Refill  . allopurinol (ZYLOPRIM) 100 MG tablet Take 200 mg by mouth daily as needed (for gout symptoms).     Marland Kitchen apixaban (ELIQUIS) 2.5 MG TABS tablet Take 1 tablet (2.5 mg total) by mouth 2 (two) times daily. 90 tablet 0  . cyanocobalamin 1000 MCG tablet Take 1 tablet (1,000 mcg total) by mouth daily. 90 tablet 0  . diclofenac sodium (VOLTAREN) 1 % GEL Apply 4 g topically 4 (four) times daily. 1 Tube 0  . docusate sodium (COLACE) 100 MG capsule Take 100 mg by mouth 2 (two) times daily.    . finasteride (PROSCAR) 5 MG tablet Take 1 tablet (5 mg total) by mouth daily. 30 tablet 0  . glipiZIDE (GLUCOTROL) 5 MG tablet Take 5 mg by mouth daily before breakfast.    . hydrALAZINE (APRESOLINE) 50 MG tablet Take 1 tablet (50 mg total) by mouth 3 (three) times daily. 270 tablet 3  . HYDROcodone-acetaminophen (NORCO/VICODIN) 5-325 MG tablet Take 2 tablets by mouth every 4 (four) hours as needed. 6 tablet 0  . ipratropium-albuterol (DUONEB) 0.5-2.5 (3) MG/3ML SOLN Take 3 mLs by nebulization every 4 (four) hours as needed. 360 mL 0  . isosorbide mononitrate (IMDUR) 60 MG 24 hr tablet Take 1 tablet (60 mg total) by mouth daily. 90 tablet 3  . ketoconazole (NIZORAL) 2 % shampoo Apply 1 application topically 2 (two) times a week.    . levothyroxine (SYNTHROID) 125 MCG tablet Take 1 tablet (125 mcg  total) by mouth daily before breakfast. 90 tablet 1  . magnesium oxide (MAG-OX) 400 MG tablet Take 400 mg by mouth daily.    . methocarbamol (ROBAXIN) 500 MG tablet Take 1 tablet (500 mg total) by mouth every 6 (six) hours as needed for muscle spasms. 30 tablet 0  . nitroGLYCERIN (NITROSTAT) 0.4 MG SL tablet Place 1 tablet (0.4 mg total) under the tongue every 5 (five) minutes x 3 doses as needed for chest pain. 25 tablet 0  . rosuvastatin (CRESTOR) 10 MG tablet Take 1 tablet (10 mg total) by mouth daily at 6 PM. 30 tablet 5  .  sertraline (ZOLOFT) 25 MG tablet Take 25 mg by mouth daily.     Marland Kitchen spironolactone (ALDACTONE) 25 MG tablet Take 1 tablet (25 mg total) by mouth daily. 30 tablet 6  . tamsulosin (FLOMAX) 0.4 MG CAPS capsule Take 0.4 mg by mouth at bedtime.    . torsemide (DEMADEX) 20 MG tablet Take 20 mg by mouth daily.     No current facility-administered medications for this encounter.     Allergies:   Lisinopril and Xarelto [rivaroxaban]   Social History:  The patient  reports that he has quit smoking. His smoking use included cigarettes and cigars. He has a 15.00 pack-year smoking history. He has quit using smokeless tobacco. He reports that he does not drink alcohol or use drugs.   Family History:  The patient's family history includes Heart Problems in his maternal grandmother; Heart disease in his mother; Other in an other family member.   ROS:  Please see the history of present illness.   All other systems are personally reviewed and negative.   Exam:  BP (!) 107/47   Pulse 77   Wt 96.5 kg (212 lb 12.8 oz)   SpO2 96%   BMI 33.33 kg/m  General: NAD Neck: No JVD, no thyromegaly or thyroid nodule.  Lungs: Clear to auscultation bilaterally with normal respiratory effort. CV: Lateral PMI.  Heart regular S1/S2, no S3/S4, no murmur. 1+ edema to knees.  No carotid bruit.  Normal pedal pulses.  Abdomen: Soft, nontender, no hepatosplenomegaly, no distention.  Skin: Intact without lesions or rashes.  Neurologic: Alert and oriented x 3.  Psych: Normal affect. Extremities: No clubbing or cyanosis.  HEENT: Normal.   Recent Labs: 11/23/2018: B Natriuretic Peptide 1,382.9 03/10/2019: Magnesium 1.6 06/13/2019: TSH 3.523 08/10/2019: ALT 42 09/18/2019: BUN 50; Creatinine, Ser 1.52; Hemoglobin 10.4; Platelets 215; Potassium 5.0; Sodium 137  Personally reviewed   Wt Readings from Last 3 Encounters:  09/18/19 96.5 kg (212 lb 12.8 oz)  08/15/19 86.2 kg (190 lb)  08/10/19 94.5 kg (208 lb 4.8 oz)       ASSESSMENT AND PLAN:  1. Chronic systolic CHF: Primarily nonischemic cardiomyopathy. EF has been low since at least 2014.  He has single chamber Biotronik ICD. QRS not wide enough for CRT.Settings adjusted in the past to prevent RV pacing in the face of a long 1st degree AV block, he is not pacing today.  Most recent echo in 12/19 with EF 20-25%, diffuse hypokinesis, moderate RV systolic dysfunction. NYHA class II symptoms.  He has peripheral edema but no JVD, think volume status is ok and he has venous insufficiency.     - Continue torsemide 20 mg daily, BMET today.    - Continue spironolactone 25 mg qhs.   - Continue hydralazine 50 mg tid today and Imdur to 60 mg daily. Bidil not covered by [place]. No BP  room to increase.  -Hold off on beta blocker with long 1st degree AVB to limit RV pacing.  -No ACEI or ARNI with angioedema on lisinopril. - Would hold off on ARB with recent AKI.  2. CAD: S/p PCI 07/2018 to pLAD but CAD does not explain his cardiomyopathy.  No chest pain.  - He is off Plavix now, on Eliquis for his atrial fibrillation.  - Continue Crestor, check lipids today.    3.Atrial fibrillation: Paroxysmal.  He is in NSR today.   - Continue Eliquis 2.5 mg bid (age, creatinine > 1.5).  - He is now off amiodarone with marked hypothyroidism.  4. CKD: Stage 3.  I will arrange for BMET.  5. Hypothyroidism: Suspect this is due to amiodarone.  Levoxyl per PCP.   Followup in 3 months   Signed, Loralie Champagne, MD  09/18/2019  [place] [address] [facility] [place] [address] [phone] (office) [phone] (fax)

## 2019-09-20 ENCOUNTER — Other Ambulatory Visit (HOSPITAL_COMMUNITY): Payer: Self-pay

## 2019-09-20 NOTE — Progress Notes (Signed)
Came out today for med rec. He was not home yesterday afternoon when I came out post clinic visit.   He has 5 pill boxes and asked I fill those up.  He ran out of his hydralazine- he has it through the 2 and half blue pill boxes thru Tuesday.  Isosorbide is in the blue pill boxes.  Everything else is done in there.  He called the [place] pharmacy to order what was needed- Hydralazine, isosorbide, rosuvastatin, synthroid, allopurinol and colchicine.  He asked about the robaxin but dr Elberta Spaniel office said they didn't prescribe that-he is going soon to follow up so I told him to address that then.  His aide is going to help him put on the compression stockings.   Marylouise Stacks, EMT-Paramedic  09/20/19

## 2019-09-21 ENCOUNTER — Ambulatory Visit (INDEPENDENT_AMBULATORY_CARE_PROVIDER_SITE_OTHER): Payer: No Typology Code available for payment source | Admitting: *Deleted

## 2019-09-21 DIAGNOSIS — I5043 Acute on chronic combined systolic (congestive) and diastolic (congestive) heart failure: Secondary | ICD-10-CM | POA: Diagnosis not present

## 2019-09-21 DIAGNOSIS — I428 Other cardiomyopathies: Secondary | ICD-10-CM

## 2019-09-28 ENCOUNTER — Other Ambulatory Visit: Payer: Self-pay

## 2019-09-28 LAB — CUP PACEART REMOTE DEVICE CHECK
Date Time Interrogation Session: 20201030130621
Implantable Lead Implant Date: 20161221
Implantable Lead Location: 753860
Implantable Lead Model: 365501
Implantable Lead Serial Number: 10600411
Implantable Pulse Generator Implant Date: 20161221
Pulse Gen Model: 399436
Pulse Gen Serial Number: 60886010

## 2019-10-01 ENCOUNTER — Other Ambulatory Visit: Payer: Self-pay

## 2019-10-01 ENCOUNTER — Other Ambulatory Visit (HOSPITAL_COMMUNITY): Payer: Self-pay

## 2019-10-01 NOTE — Progress Notes (Signed)
Remote ICD transmission.   

## 2019-10-01 NOTE — Progress Notes (Signed)
Paramedicine Encounter    Patient ID: Shreyan Hinz, male    DOB: 08-20-1934, 85 y.o.   MRN: [id number]   Patient Care Team: [facility], [facility] as PCP - General (General Practice) [name], MD as PCP - Cardiology (Cardiology) Laurey Morale, MD as PCP - Advanced Heart Failure (Cardiology)  Patient Active Problem List   Diagnosis Date Noted  . AKI (acute kidney injury) (HCC) 03/14/2019  . Spasm of muscle of lower back 03/14/2019  . Acute renal failure (ARF) (HCC) 03/09/2019  . Transaminitis   . Acute on chronic renal failure (HCC) 03/08/2019  . Acute exacerbation of CHF (congestive heart failure) (HCC) 11/23/2018  . Acute on chronic heart failure (HCC) 10/19/2018  . Pulmonary emphysema (HCC)   . Chronic respiratory failure with hypoxia (HCC)   . Morbid obesity (HCC) 09/09/2018  . URI (upper respiratory infection) 08/31/2018  . UTI (urinary tract infection) 08/31/2018  . Chronic systolic (congestive) heart failure (HCC) 08/28/2018  . CHF (congestive heart failure) (HCC) 08/14/2018  . Acute on chronic combined systolic and diastolic CHF (congestive heart failure) (HCC) 07/18/2018  . First degree AV block   . Shortness of breath 04/29/2018  . Acute HFrEF (heart failure with reduced ejection fraction) (HCC) 04/29/2018  . Acute respiratory failure with hypoxia (HCC) 09/27/2017  . CHF exacerbation (HCC) 09/27/2017  . Renal insufficiency   . Acute on chronic systolic CHF (congestive heart failure) (HCC) 07/08/2017  . Left knee pain 07/08/2017  . Acute bronchitis 05/10/2017  . Acute congestive heart failure (HCC) 04/14/2017  . Elevated troponin 04/14/2017  . Lactic acidosis   . Acute on chronic systolic heart failure, NYHA class 2 (HCC) 02/03/2017  . Diabetes mellitus type 2 in obese (HCC) 02/03/2017  . CAP (community acquired pneumonia) 02/03/2017  . Lobar pneumonia (HCC)   . Diabetes mellitus with complication (HCC)   . Acute on chronic respiratory failure with  hypoxia (HCC)   . Acute on chronic combined systolic and diastolic heart failure (HCC) 05/19/2016  . Depression 05/19/2016  . Gout 05/19/2016  . 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 NSVT (nonsustained ventricular tachycardia) (HCC)   . Chronic renal insufficiency, stage 3 (moderate) (HCC)   . Hyperkalemia 08/21/2015  . Pulmonary embolism (HCC)   . Acute respiratory failure (HCC)   . Osteoarthritis of left hip 10/25/2013  . Atrial fibrillation (HCC) 10/25/2013  . S/P hip replacement 10/24/2013  . Nonischemic cardiomyopathy (HCC)   . Obesity (BMI 30-39.9)   . Essential hypertension   . Hyperlipidemia     Current Outpatient Medications:  .  allopurinol (ZYLOPRIM) 100 MG tablet, Take 200 mg by mouth daily as needed (for gout symptoms). , Disp: , Rfl:  .  apixaban (ELIQUIS) 2.5 MG TABS tablet, Take 1 tablet (2.5 mg total) by mouth 2 (two) times daily., Disp: 90 tablet, Rfl: 0 .  cyanocobalamin 1000 MCG tablet, Take 1 tablet (1,000 mcg total) by mouth daily., Disp: 90 tablet, Rfl: 0 .  diclofenac sodium (VOLTAREN) 1 % GEL, Apply 4 g topically 4 (four) times daily., Disp: 1 Tube, Rfl: 0 .  docusate sodium (COLACE) 100 MG capsule, Take 100 mg by mouth 2 (two) times daily., Disp: , Rfl:  .  finasteride (PROSCAR) 5 MG tablet, Take 1 tablet (5 mg total) by mouth daily., Disp: 30 tablet, Rfl: 0 .  glipiZIDE (GLUCOTROL) 5 MG tablet, Take 5 mg by mouth daily before breakfast., Disp: , Rfl:  .  hydrALAZINE (APRESOLINE) 50 MG tablet,  Take 1 tablet (50 mg total) by mouth 3 (three) times daily., Disp: 270 tablet, Rfl: 3 .  HYDROcodone-acetaminophen (NORCO/VICODIN) 5-325 MG tablet, Take 2 tablets by mouth every 4 (four) hours as needed., Disp: 6 tablet, Rfl: 0 .  ipratropium-albuterol (DUONEB) 0.5-2.5 (3) MG/3ML SOLN, Take 3 mLs by nebulization every 4 (four) hours as needed., Disp: 360 mL, Rfl: 0 .  isosorbide mononitrate (IMDUR) 60 MG 24 hr tablet, Take 1 tablet (60 mg  total) by mouth daily., Disp: 90 tablet, Rfl: 3 .  ketoconazole (NIZORAL) 2 % shampoo, Apply 1 application topically 2 (two) times a week., Disp: , Rfl:  .  levothyroxine (SYNTHROID) 125 MCG tablet, Take 1 tablet (125 mcg total) by mouth daily before breakfast., Disp: 90 tablet, Rfl: 1 .  magnesium oxide (MAG-OX) 400 MG tablet, Take 400 mg by mouth daily., Disp: , Rfl:  .  methocarbamol (ROBAXIN) 500 MG tablet, Take 1 tablet (500 mg total) by mouth every 6 (six) hours as needed for muscle spasms., Disp: 30 tablet, Rfl: 0 .  rosuvastatin (CRESTOR) 10 MG tablet, Take 1 tablet (10 mg total) by mouth daily at 6 PM., Disp: 30 tablet, Rfl: 5 .  sertraline (ZOLOFT) 25 MG tablet, Take 25 mg by mouth daily. , Disp: , Rfl:  .  spironolactone (ALDACTONE) 25 MG tablet, Take 1 tablet (25 mg total) by mouth daily., Disp: 30 tablet, Rfl: 6 .  tamsulosin (FLOMAX) 0.4 MG CAPS capsule, Take 0.4 mg by mouth at bedtime., Disp: , Rfl:  .  torsemide (DEMADEX) 20 MG tablet, Take 20 mg by mouth daily., Disp: , Rfl:  .  nitroGLYCERIN (NITROSTAT) 0.4 MG SL tablet, Place 1 tablet (0.4 mg total) under the tongue every 5 (five) minutes x 3 doses as needed for chest pain. (Patient not taking: Reported on 09/20/2019), Disp: 25 tablet, Rfl: 0 Allergies  Allergen Reactions  . Lisinopril Swelling    Facial swelling  . Xarelto [Rivaroxaban] Other (See Comments)    "Blood came out of my penis"      Social History   Socioeconomic History  . Marital status: Married    Spouse name: Not on file  . Number of children: Not on file  . Years of education: Not on file  . Highest education level: Not on file  Occupational History  . Occupation: Retired    Comment: Former Patent examiner  . Financial resource strain: Not on file  . Food insecurity    Worry: Not on file    Inability: Not on file  . Transportation needs    Medical: Not on file    Non-medical: Not on file  Tobacco Use  . Smoking status: Former Smoker     Packs/day: 1.00    Years: 15.00    Pack years: 15.00    Types: Cigarettes, Cigars  . Smokeless tobacco: Former Neurosurgeon  . Tobacco comment: quit smoking ~ 50 yr ago  Substance and Sexual Activity  . Alcohol use: No    Alcohol/week: 2.0 standard drinks    Types: 2 Cans of beer per week    Comment: rare beer.  . Drug use: No  . Sexual activity: Yes  Lifestyle  . Physical activity    Days per week: Not on file    Minutes per session: Not on file  . Stress: Not on file  Relationships  . Social Musician on phone: Not on file    Gets together: Not on file  Attends religious service: Not on file    Active member of club or organization: Not on file    Attends meetings of clubs or organizations: Not on file    Relationship status: Not on file  . Intimate partner violence    Fear of current or ex partner: No    Emotionally abused: No    Physically abused: No    Forced sexual activity: No  Other Topics Concern  . Not on file  Social History Narrative   Lives in [place] with wife. Moved from [place] (2013). Does not routinely exercise.    Physical Exam      Future Appointments  Date Time Provider [place]  11/02/2019  4:15 PM Philemon Kingdom, MD [facility] None  12/20/2019 11:00 AM Larey Dresser, MD [facility] None  12/21/2019  7:35 AM [facility] DEVICE REMOTES [facility]  03/21/2020  7:35 AM [facility] DEVICE REMOTES [facility]  06/20/2020  7:35 AM [facility] DEVICE REMOTES [facility]  09/19/2020  7:35 AM [facility] DEVICE REMOTES [facility]    BP 128/70 (BP Location: Left Arm, Patient Position: Sitting, Cuff Size: Normal)   Pulse 96 Comment: irregular  Temp 97.7 F (36.5 C)   Resp 18   Wt 193 lb (87.5 kg)   SpO2 98%   BMI 30.23 kg/m   Weight yesterday-193 Last visit weight-211 @ clinic   Came out today for med rec to fill rest of pts pill box up post pharmacy delivery.  HR all over  the place from 70-110 No edema noted. He is wearing compression stockings and they are making a huge difference. His weight is down considerably, he reports at clinic he had a lot of stuff in his pockets at that time as well as he is now wearing those stockings.   12 lead done and it seems he is in afib-HR bouncing between 70-110  he denies feeling anything abnormal. He denies palpitations, no c/p, no dizziness. No sob. He states if he felt anything abnormal he would go to the hosp.  He missed a remote device check on 10/23-sent them message asking what does he need to do to resch that.  Printed out 12 lead strips and will take them to clinic office today to show providers.   Marylouise Stacks, [facility] Paramedic  10/01/19

## 2019-10-02 ENCOUNTER — Ambulatory Visit: Payer: No Typology Code available for payment source | Admitting: Internal Medicine

## 2019-10-02 ENCOUNTER — Telehealth (HOSPITAL_COMMUNITY): Payer: Self-pay

## 2019-10-02 NOTE — Telephone Encounter (Signed)
Attempted to contact PT to schedule appt to see Dr. Aundra Dubin today.  Tried calling home number and mobile number on file and left messages on both numbers.  Unable to reach PT to schedule appt.

## 2019-10-12 ENCOUNTER — Other Ambulatory Visit: Payer: Self-pay | Admitting: Cardiology

## 2019-10-22 ENCOUNTER — Telehealth (HOSPITAL_COMMUNITY): Payer: Self-pay | Admitting: *Deleted

## 2019-10-22 ENCOUNTER — Other Ambulatory Visit (HOSPITAL_COMMUNITY): Payer: Self-pay

## 2019-10-22 DIAGNOSIS — I5022 Chronic systolic (congestive) heart failure: Secondary | ICD-10-CM

## 2019-10-22 MED ORDER — TORSEMIDE 20 MG PO TABS: 40.0000 mg | ORAL_TABLET | Freq: Every day | ORAL | Status: DC

## 2019-10-22 NOTE — Progress Notes (Addendum)
Paramedicine Encounter    Patient ID: Ryan Macdonald, male    DOB: 08-20-1934, 85 y.o.   MRN: [id number]   Patient Care Team: Center, [facility] as PCP - General (General Practice) [name], MD as PCP - Cardiology (Cardiology) Laurey Morale, MD as PCP - Advanced Heart Failure (Cardiology)  Patient Active Problem List   Diagnosis Date Noted  . AKI (acute kidney injury) (HCC) 03/14/2019  . Spasm of muscle of lower back 03/14/2019  . Acute renal failure (ARF) (HCC) 03/09/2019  . Transaminitis   . Acute on chronic renal failure (HCC) 03/08/2019  . Acute exacerbation of CHF (congestive heart failure) (HCC) 11/23/2018  . Acute on chronic heart failure (HCC) 10/19/2018  . Pulmonary emphysema (HCC)   . Chronic respiratory failure with hypoxia (HCC)   . Morbid obesity (HCC) 09/09/2018  . URI (upper respiratory infection) 08/31/2018  . UTI (urinary tract infection) 08/31/2018  . Chronic systolic (congestive) heart failure (HCC) 08/28/2018  . CHF (congestive heart failure) (HCC) 08/14/2018  . Acute on chronic combined systolic and diastolic CHF (congestive heart failure) (HCC) 07/18/2018  . First degree AV block   . Shortness of breath 04/29/2018  . Acute HFrEF (heart failure with reduced ejection fraction) (HCC) 04/29/2018  . Acute respiratory failure with hypoxia (HCC) 09/27/2017  . CHF exacerbation (HCC) 09/27/2017  . Renal insufficiency   . Acute on chronic systolic CHF (congestive heart failure) (HCC) 07/08/2017  . Left knee pain 07/08/2017  . Acute bronchitis 05/10/2017  . Acute congestive heart failure (HCC) 04/14/2017  . Elevated troponin 04/14/2017  . Lactic acidosis   . Acute on chronic systolic heart failure, NYHA class 2 (HCC) 02/03/2017  . Diabetes mellitus type 2 in obese (HCC) 02/03/2017  . CAP (community acquired pneumonia) 02/03/2017  . Lobar pneumonia (HCC)   . Diabetes mellitus with complication (HCC)   . Acute on chronic respiratory failure with  hypoxia (HCC)   . Acute on chronic combined systolic and diastolic heart failure (HCC) 05/19/2016  . Depression 05/19/2016  . Gout 05/19/2016  . 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 NSVT (nonsustained ventricular tachycardia) (HCC)   . Chronic renal insufficiency, stage 3 (moderate) (HCC)   . Hyperkalemia 08/21/2015  . Pulmonary embolism (HCC)   . Acute respiratory failure (HCC)   . Osteoarthritis of left hip 10/25/2013  . Atrial fibrillation (HCC) 10/25/2013  . S/P hip replacement 10/24/2013  . Nonischemic cardiomyopathy (HCC)   . Obesity (BMI 30-39.9)   . Essential hypertension   . Hyperlipidemia     Current Outpatient Medications:  .  allopurinol (ZYLOPRIM) 100 MG tablet, Take 200 mg by mouth daily as needed (for gout symptoms). , Disp: , Rfl:  .  apixaban (ELIQUIS) 2.5 MG TABS tablet, Take 1 tablet (2.5 mg total) by mouth 2 (two) times daily., Disp: 90 tablet, Rfl: 0 .  cyanocobalamin 1000 MCG tablet, Take 1 tablet (1,000 mcg total) by mouth daily., Disp: 90 tablet, Rfl: 0 .  diclofenac sodium (VOLTAREN) 1 % GEL, Apply 4 g topically 4 (four) times daily., Disp: 1 Tube, Rfl: 0 .  docusate sodium (COLACE) 100 MG capsule, Take 100 mg by mouth 2 (two) times daily., Disp: , Rfl:  .  finasteride (PROSCAR) 5 MG tablet, Take 1 tablet (5 mg total) by mouth daily., Disp: 30 tablet, Rfl: 0 .  glipiZIDE (GLUCOTROL) 5 MG tablet, Take 5 mg by mouth daily before breakfast., Disp: , Rfl:  .  hydrALAZINE (APRESOLINE) 50 MG tablet,  Take 1 tablet (50 mg total) by mouth 3 (three) times daily., Disp: 270 tablet, Rfl: 3 .  ipratropium-albuterol (DUONEB) 0.5-2.5 (3) MG/3ML SOLN, Take 3 mLs by nebulization every 4 (four) hours as needed., Disp: 360 mL, Rfl: 0 .  isosorbide mononitrate (IMDUR) 60 MG 24 hr tablet, Take 1 tablet (60 mg total) by mouth daily., Disp: 90 tablet, Rfl: 3 .  ketoconazole (NIZORAL) 2 % shampoo, Apply 1 application topically 2 (two) times a week., Disp:  , Rfl:  .  levothyroxine (SYNTHROID) 125 MCG tablet, Take 1 tablet (125 mcg total) by mouth daily before breakfast., Disp: 90 tablet, Rfl: 1 .  magnesium oxide (MAG-OX) 400 MG tablet, Take 400 mg by mouth daily., Disp: , Rfl:  .  methocarbamol (ROBAXIN) 500 MG tablet, Take 1 tablet (500 mg total) by mouth every 6 (six) hours as needed for muscle spasms., Disp: 30 tablet, Rfl: 0 .  rosuvastatin (CRESTOR) 10 MG tablet, Take 1 tablet (10 mg total) by mouth daily at 6 PM., Disp: 30 tablet, Rfl: 5 .  sertraline (ZOLOFT) 25 MG tablet, Take 25 mg by mouth daily. , Disp: , Rfl:  .  spironolactone (ALDACTONE) 25 MG tablet, Take 1 tablet (25 mg total) by mouth daily., Disp: 30 tablet, Rfl: 6 .  tamsulosin (FLOMAX) 0.4 MG CAPS capsule, Take 0.4 mg by mouth at bedtime., Disp: , Rfl:  .  HYDROcodone-acetaminophen (NORCO/VICODIN) 5-325 MG tablet, Take 2 tablets by mouth every 4 (four) hours as needed. (Patient not taking: Reported on 10/22/2019), Disp: 6 tablet, Rfl: 0 .  nitroGLYCERIN (NITROSTAT) 0.4 MG SL tablet, Place 1 tablet (0.4 mg total) under the tongue every 5 (five) minutes x 3 doses as needed for chest pain. (Patient not taking: Reported on 09/20/2019), Disp: 25 tablet, Rfl: 0 .  torsemide (DEMADEX) 20 MG tablet, Take 2 tablets (40 mg total) by mouth daily., Disp: , Rfl:  Allergies  Allergen Reactions  . Lisinopril Swelling    Facial swelling  . Xarelto [Rivaroxaban] Other (See Comments)    "Blood came out of my penis"      Social History   Socioeconomic History  . Marital status: Married    Spouse name: Not on file  . Number of children: Not on file  . Years of education: Not on file  . Highest education level: Not on file  Occupational History  . Occupation: Retired    Comment: Former Patent examiner  . Financial resource strain: Not on file  . Food insecurity    Worry: Not on file    Inability: Not on file  . Transportation needs    Medical: Not on file    Non-medical: Not on  file  Tobacco Use  . Smoking status: Former Smoker    Packs/day: 1.00    Years: 15.00    Pack years: 15.00    Types: Cigarettes, Cigars  . Smokeless tobacco: Former Neurosurgeon  . Tobacco comment: quit smoking ~ 50 yr ago  Substance and Sexual Activity  . Alcohol use: No    Alcohol/week: 2.0 standard drinks    Types: 2 Cans of beer per week    Comment: rare beer.  . Drug use: No  . Sexual activity: Yes  Lifestyle  . Physical activity    Days per week: Not on file    Minutes per session: Not on file  . Stress: Not on file  Relationships  . Social connections    Talks on phone: Not on file  Gets together: Not on file    Attends religious service: Not on file    Active member of club or organization: Not on file    Attends meetings of clubs or organizations: Not on file    Relationship status: Not on file  . Intimate partner violence    Fear of current or ex partner: No    Emotionally abused: No    Physically abused: No    Forced sexual activity: No  Other Topics Concern  . Not on file  Social History Narrative   Lives in [place] with wife. Moved from [place] (2013). Does not routinely exercise.    Physical Exam      Future Appointments  Date Time Provider [place]  10/29/2019  1:45 PM Edrick Kins, [place] [facility]  11/02/2019  4:15 PM Philemon Kingdom, MD [facility] None  12/20/2019 11:00 AM Larey Dresser, MD [facility] None  12/21/2019  7:35 AM [facility] DEVICE REMOTES [facility]  03/21/2020  7:35 AM [facility] DEVICE REMOTES [facility]  06/20/2020  7:35 AM [facility] DEVICE REMOTES [facility]  09/19/2020  7:35 AM [facility] DEVICE REMOTES [facility]    BP 126/74   Pulse 88   Resp 16   Wt 201 lb (91.2 kg)   SpO2 98%   BMI 31.48 kg/m   Weight yesterday-?? Last visit weight-193  Pt reports he is doing ok. He has been tending to his wife post her knee replacement.  He reports  swelling in his feet. He has not been able to use his compression stockings.  Last time he weighed it was 195 on 11/15 and he has not been able to weigh since then due to tending care to her.  He denies increased sob, no dizziness, no c/p. He denies any issues or complaints at this time. He did miss quite a few of his evening/bedtime doses of his meds.  I advised him to be sure to take his evening/bedtime doses of advised him of the purpose and importancenof those meds.  4 pill boxes refilled.  Pt reports he gets intermittent blood in urine-he states he does have appoint with urology with [place] soon. His weight is up to 201, a few wks ago he was 193.  I called heather at clinic and per dr Aundra Dubin he is to increase his torsemide to 40 am in 20 pm for 2 days Take 2 extra today After that 40mg  daily.  So that change was made in pill box and labs were sch for him next week.    Marylouise Stacks, [facility] Paramedic  10/22/19

## 2019-10-22 NOTE — Telephone Encounter (Signed)
Pt called to report pt's wt is up 6 lbs from last week and he has LE edema up to his calf, denies any sob/cp/dizziness, pt is on Torsemide 20 mg daily.  Discussed w/Dr Aundra Dubin he advised pt increase to 40 mg in AM and 20 mg in PM for 2 days then take 40 mg daily, bmet in 1 week.  Joellen Jersey is aware, she will advise pt and adjust pill box, bmet sch for 12/2 at 11 am

## 2019-10-24 ENCOUNTER — Ambulatory Visit: Payer: Non-veteran care | Admitting: Podiatry

## 2019-10-29 ENCOUNTER — Other Ambulatory Visit: Payer: Self-pay

## 2019-10-29 ENCOUNTER — Ambulatory Visit (INDEPENDENT_AMBULATORY_CARE_PROVIDER_SITE_OTHER): Payer: Non-veteran care | Admitting: Podiatry

## 2019-10-29 DIAGNOSIS — B351 Tinea unguium: Secondary | ICD-10-CM

## 2019-10-29 DIAGNOSIS — E0843 Diabetes mellitus due to underlying condition with diabetic autonomic (poly)neuropathy: Secondary | ICD-10-CM | POA: Diagnosis not present

## 2019-10-29 DIAGNOSIS — L989 Disorder of the skin and subcutaneous tissue, unspecified: Secondary | ICD-10-CM | POA: Diagnosis not present

## 2019-10-29 DIAGNOSIS — M79676 Pain in unspecified toe(s): Secondary | ICD-10-CM | POA: Diagnosis not present

## 2019-11-01 NOTE — Progress Notes (Signed)
    Subjective: Patient is a 85 y.o. male presenting to the office today with a chief complaint of painful callus lesion(s) noted to the bilateral feet that have been present for the past few months. Walking and bearing weight increases the pain. He has not had any treatment.  Patient also complains of elongated, thickened nails that cause pain while ambulating in shoes. He is unable to trim his own nails. Patient presents today for further treatment and evaluation.  Past Medical History:  Diagnosis Date  . AKI (acute kidney injury) (HCC) 02/2019  . Atrial fibrillation (HCC)   . Cardiomyopathy (HCC)   . Cat scratch fever    removed mass on right side neck  . Cataracts, bilateral   . CHF (congestive heart failure) (HCC)   . Chronic kidney disease   . Corns and callosities   . Diabetes mellitus without complication (HCC)    TYPE 2  . Dyspnea   . Erythema intertrigo   . Flat foot   . Gout   . High cholesterol   . Hx of urethral stricture   . Hypertension   . Kidney stones   . Morbid obesity (HCC)   . Nonischemic cardiomyopathy (HCC)    a. ? 2009 Cath in [place] - nl cors per pt;  b. 09/2013 Echo: EF 25-30%, sev diff HK.  . Obesity   . Osteoarthritis   . PAF (paroxysmal atrial fibrillation) (HCC)    a. post-op hip in 2014 - prev on xarelto.  . Renal insufficiency     Objective:  Physical Exam General: Alert and oriented x3 in no acute distress  Dermatology: Hyperkeratotic lesion(s) present on the bilateral feet. Pain on palpation with a central nucleated core noted. Skin is warm, dry and supple bilateral lower extremities. Negative for open lesions or macerations. Nails are tender, long, thickened and dystrophic with subungual debris, consistent with onychomycosis, 1-5 bilateral. No signs of infection noted.  Vascular: Palpable pedal pulses bilaterally. No edema or erythema noted. Capillary refill within normal limits.  Neurological: Epicritic and protective threshold diminished  bilaterally.   Musculoskeletal Exam: Pain on palpation at the keratotic lesion(s) noted. Range of motion within normal limits bilateral. Muscle strength 5/5 in all groups bilateral.  Assessment: 1. Onychodystrophic nails 1-5 bilateral with hyperkeratosis of nails.  2. Onychomycosis of nail due to dermatophyte bilateral 3. Pre-ulcerative callus lesions noted to the bilateral feet x 5   Plan of Care:  1. Patient evaluated. 2. Excisional debridement of keratoic lesion(s) using a chisel blade was performed without incident.  3. Dressed with light dressing. 4. Mechanical debridement of nails 1-5 bilaterally performed using a nail nipper. Filed with dremel without incident.  5. Patient is to return to the clinic in 3 months.   Wakeelah Solan M. Rabecka Brendel, DPM [facility]  Dr. Harden Bramer M. Shloime Keilman, DPM    [address]                                        [place], [place] [address]                Office [phone]  Fax [phone]   

## 2019-11-02 ENCOUNTER — Ambulatory Visit: Payer: No Typology Code available for payment source | Admitting: Internal Medicine

## 2019-11-05 ENCOUNTER — Telehealth (HOSPITAL_COMMUNITY): Payer: Self-pay

## 2019-11-05 NOTE — Telephone Encounter (Signed)
Made in error

## 2019-11-08 ENCOUNTER — Other Ambulatory Visit (HOSPITAL_COMMUNITY): Payer: Self-pay

## 2019-11-08 ENCOUNTER — Encounter (HOSPITAL_COMMUNITY): Payer: No Typology Code available for payment source | Admitting: Cardiology

## 2019-11-08 NOTE — Progress Notes (Signed)
Paramedicine Encounter   Patient ID: Ryan Macdonald , male,   DOB: 29-Sep-1934,85 y.o.,  MRN: [id number]   Met patient in clinic today with provider.   No show appointment to clinic.    Marylouise Stacks, [place] 11/08/2019

## 2019-11-12 ENCOUNTER — Telehealth (HOSPITAL_COMMUNITY): Payer: Self-pay

## 2019-11-12 NOTE — Telephone Encounter (Signed)
Called pt to f/u on his no show to clinic appoint last week.  No answer. Unable to LVM.   Marylouise Stacks, EMT-Paramedic  11/12/19

## 2019-11-19 ENCOUNTER — Other Ambulatory Visit (HOSPITAL_COMMUNITY): Payer: Self-pay

## 2019-11-19 NOTE — Progress Notes (Signed)
Paramedicine Encounter    Patient ID: Ryan Macdonald, male    DOB: 08-20-1934, 85 y.o.   MRN: [id number]   Patient Care Team: Center, [facility] as PCP - General (General Practice) [name], MD as PCP - Cardiology (Cardiology) Laurey Morale, MD as PCP - Advanced Heart Failure (Cardiology)  Patient Active Problem List   Diagnosis Date Noted  . AKI (acute kidney injury) (HCC) 03/14/2019  . Spasm of muscle of lower back 03/14/2019  . Acute renal failure (ARF) (HCC) 03/09/2019  . Transaminitis   . Acute on chronic renal failure (HCC) 03/08/2019  . Acute exacerbation of CHF (congestive heart failure) (HCC) 11/23/2018  . Acute on chronic heart failure (HCC) 10/19/2018  . Pulmonary emphysema (HCC)   . Chronic respiratory failure with hypoxia (HCC)   . Morbid obesity (HCC) 09/09/2018  . URI (upper respiratory infection) 08/31/2018  . UTI (urinary tract infection) 08/31/2018  . Chronic systolic (congestive) heart failure (HCC) 08/28/2018  . CHF (congestive heart failure) (HCC) 08/14/2018  . Acute on chronic combined systolic and diastolic CHF (congestive heart failure) (HCC) 07/18/2018  . First degree AV block   . Shortness of breath 04/29/2018  . Acute HFrEF (heart failure with reduced ejection fraction) (HCC) 04/29/2018  . Acute respiratory failure with hypoxia (HCC) 09/27/2017  . CHF exacerbation (HCC) 09/27/2017  . Renal insufficiency   . Acute on chronic systolic CHF (congestive heart failure) (HCC) 07/08/2017  . Left knee pain 07/08/2017  . Acute bronchitis 05/10/2017  . Acute congestive heart failure (HCC) 04/14/2017  . Elevated troponin 04/14/2017  . Lactic acidosis   . Acute on chronic systolic heart failure, NYHA class 2 (HCC) 02/03/2017  . Diabetes mellitus type 2 in obese (HCC) 02/03/2017  . CAP (community acquired pneumonia) 02/03/2017  . Lobar pneumonia (HCC)   . Diabetes mellitus with complication (HCC)   . Acute on chronic respiratory failure with  hypoxia (HCC)   . Acute on chronic combined systolic and diastolic heart failure (HCC) 05/19/2016  . Depression 05/19/2016  . Gout 05/19/2016  . 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 NSVT (nonsustained ventricular tachycardia) (HCC)   . Chronic renal insufficiency, stage 3 (moderate) (HCC)   . Hyperkalemia 08/21/2015  . Pulmonary embolism (HCC)   . Acute respiratory failure (HCC)   . Osteoarthritis of left hip 10/25/2013  . Atrial fibrillation (HCC) 10/25/2013  . S/P hip replacement 10/24/2013  . Nonischemic cardiomyopathy (HCC)   . Obesity (BMI 30-39.9)   . Essential hypertension   . Hyperlipidemia     Current Outpatient Medications:  .  allopurinol (ZYLOPRIM) 100 MG tablet, Take 200 mg by mouth daily as needed (for gout symptoms). , Disp: , Rfl:  .  apixaban (ELIQUIS) 2.5 MG TABS tablet, Take 1 tablet (2.5 mg total) by mouth 2 (two) times daily., Disp: 90 tablet, Rfl: 0 .  cyanocobalamin 1000 MCG tablet, Take 1 tablet (1,000 mcg total) by mouth daily., Disp: 90 tablet, Rfl: 0 .  diclofenac sodium (VOLTAREN) 1 % GEL, Apply 4 g topically 4 (four) times daily., Disp: 1 Tube, Rfl: 0 .  docusate sodium (COLACE) 100 MG capsule, Take 100 mg by mouth 2 (two) times daily., Disp: , Rfl:  .  finasteride (PROSCAR) 5 MG tablet, Take 1 tablet (5 mg total) by mouth daily., Disp: 30 tablet, Rfl: 0 .  glipiZIDE (GLUCOTROL) 5 MG tablet, Take 5 mg by mouth daily before breakfast., Disp: , Rfl:  .  hydrALAZINE (APRESOLINE) 50 MG tablet,  Take 1 tablet (50 mg total) by mouth 3 (three) times daily., Disp: 270 tablet, Rfl: 3 .  HYDROcodone-acetaminophen (NORCO/VICODIN) 5-325 MG tablet, Take 2 tablets by mouth every 4 (four) hours as needed., Disp: 6 tablet, Rfl: 0 .  ipratropium-albuterol (DUONEB) 0.5-2.5 (3) MG/3ML SOLN, Take 3 mLs by nebulization every 4 (four) hours as needed., Disp: 360 mL, Rfl: 0 .  isosorbide mononitrate (IMDUR) 60 MG 24 hr tablet, Take 1 tablet (60 mg  total) by mouth daily., Disp: 90 tablet, Rfl: 3 .  ketoconazole (NIZORAL) 2 % shampoo, Apply 1 application topically 2 (two) times a week., Disp: , Rfl:  .  levothyroxine (SYNTHROID) 125 MCG tablet, Take 1 tablet (125 mcg total) by mouth daily before breakfast., Disp: 90 tablet, Rfl: 1 .  magnesium oxide (MAG-OX) 400 MG tablet, Take 400 mg by mouth daily., Disp: , Rfl:  .  methocarbamol (ROBAXIN) 500 MG tablet, Take 1 tablet (500 mg total) by mouth every 6 (six) hours as needed for muscle spasms., Disp: 30 tablet, Rfl: 0 .  rosuvastatin (CRESTOR) 10 MG tablet, Take 1 tablet (10 mg total) by mouth daily at 6 PM., Disp: 30 tablet, Rfl: 5 .  sertraline (ZOLOFT) 25 MG tablet, Take 25 mg by mouth daily. , Disp: , Rfl:  .  spironolactone (ALDACTONE) 25 MG tablet, Take 1 tablet (25 mg total) by mouth daily., Disp: 30 tablet, Rfl: 6 .  tamsulosin (FLOMAX) 0.4 MG CAPS capsule, Take 0.4 mg by mouth at bedtime., Disp: , Rfl:  .  torsemide (DEMADEX) 20 MG tablet, Take 2 tablets (40 mg total) by mouth daily., Disp: , Rfl:  .  nitroGLYCERIN (NITROSTAT) 0.4 MG SL tablet, Place 1 tablet (0.4 mg total) under the tongue every 5 (five) minutes x 3 doses as needed for chest pain. (Patient not taking: Reported on 11/19/2019), Disp: 25 tablet, Rfl: 0 Allergies  Allergen Reactions  . Lisinopril Swelling    Facial swelling  . Xarelto [Rivaroxaban] Other (See Comments)    "Blood came out of my penis"      Social History   Socioeconomic History  . Marital status: Married    Spouse name: Not on file  . Number of children: Not on file  . Years of education: Not on file  . Highest education level: Not on file  Occupational History  . Occupation: Retired    Comment: Former Public house manager  Tobacco Use  . Smoking status: Former Smoker    Packs/day: 1.00    Years: 15.00    Pack years: 15.00    Types: Cigarettes, Cigars  . Smokeless tobacco: Former Neurosurgeon  . Tobacco comment: quit smoking ~ 50 yr ago  Substance and Sexual  Activity  . Alcohol use: No    Alcohol/week: 2.0 standard drinks    Types: 2 Cans of beer per week    Comment: rare beer.  . Drug use: No  . Sexual activity: Yes  Other Topics Concern  . Not on file  Social History Narrative   Lives in [place] with wife. Moved from [place] (2013). Does not routinely exercise.   Social Determinants of Health   Financial Resource Strain:   . Difficulty of Paying Living Expenses: Not on file  Food Insecurity:   . Worried About Programme researcher, broadcasting/film/video in the Last Year: Not on file  . Ran Out of Food in the Last Year: Not on file  Transportation Needs:   . Lack of Transportation (Medical): Not on file  .  Lack of Transportation (Non-Medical): Not on file  Physical Activity:   . Days of Exercise per Week: Not on file  . Minutes of Exercise per Session: Not on file  Stress:   . Feeling of Stress : Not on file  Social Connections:   . Frequency of Communication with Friends and Family: Not on file  . Frequency of Social Gatherings with Friends and Family: Not on file  . Attends Religious Services: Not on file  . Active Member of Clubs or Organizations: Not on file  . Attends Archivist Meetings: Not on file  . Marital Status: Not on file  Intimate Partner Violence:   . Fear of Current or Ex-Partner: Not on file  . Emotionally Abused: Not on file  . Physically Abused: Not on file  . Sexually Abused: Not on file    Physical Exam      Future Appointments  Date Time Provider [place]  12/20/2019 11:00 AM Larey Dresser, MD [facility] None  12/21/2019  7:35 AM [facility] DEVICE REMOTES [facility]  01/28/2020  1:45 PM Edrick Kins, DPM [facility]  03/21/2020  7:35 AM [facility] DEVICE REMOTES [facility]  06/20/2020  7:35 AM [facility] DEVICE REMOTES [facility]  09/19/2020  7:35 AM [facility] DEVICE REMOTES [facility]    BP 108/70   Pulse 98   Resp 18   SpO2  97%   Weight yesterday-201 Last visit weight-201  Pt reports he missed his last clinic visit due to him tending to his wife after her knee surgery. He has been stressed with that and tired, little to no sleep.  He denies sob, no c/p, he did have an episode of dizziness a few wks ago when he laid down in the bed but it subsided after a few min and has not had that happen since. He did not take his meds yet this morning.  He does have a lot of swelling to his legs and feet-he states his aide worker is due to put his compression stockings on him today. He has had them off past few days.  I urged him to monitor his sodium and fluid intake as he mentioned eating pork foods.   Marylouise Stacks, [facility] Paramedic  11/19/19

## 2019-12-20 ENCOUNTER — Other Ambulatory Visit (HOSPITAL_COMMUNITY): Payer: Self-pay

## 2019-12-20 ENCOUNTER — Telehealth (HOSPITAL_COMMUNITY): Payer: Self-pay

## 2019-12-20 ENCOUNTER — Ambulatory Visit (HOSPITAL_COMMUNITY)
Admission: RE | Admit: 2019-12-20 | Discharge: 2019-12-20 | Disposition: A | Discharge status: Home / Self Care | Payer: No Typology Code available for payment source | Source: Ambulatory Visit | Attending: Cardiology | Admitting: Cardiology

## 2019-12-20 ENCOUNTER — Encounter (HOSPITAL_COMMUNITY): Payer: Self-pay | Admitting: Cardiology

## 2019-12-20 ENCOUNTER — Other Ambulatory Visit: Payer: Self-pay

## 2019-12-20 VITALS — BP 133/73 | HR 88 | Wt 197.0 lb

## 2019-12-20 DIAGNOSIS — E039 Hypothyroidism, unspecified: Secondary | ICD-10-CM | POA: Insufficient documentation

## 2019-12-20 DIAGNOSIS — Z87891 Personal history of nicotine dependence: Secondary | ICD-10-CM | POA: Diagnosis not present

## 2019-12-20 DIAGNOSIS — F329 Major depressive disorder, single episode, unspecified: Secondary | ICD-10-CM | POA: Diagnosis not present

## 2019-12-20 DIAGNOSIS — E785 Hyperlipidemia, unspecified: Secondary | ICD-10-CM | POA: Diagnosis not present

## 2019-12-20 DIAGNOSIS — I2510 Atherosclerotic heart disease of native coronary artery without angina pectoris: Secondary | ICD-10-CM | POA: Insufficient documentation

## 2019-12-20 DIAGNOSIS — R05 Cough: Secondary | ICD-10-CM | POA: Insufficient documentation

## 2019-12-20 DIAGNOSIS — M109 Gout, unspecified: Secondary | ICD-10-CM | POA: Diagnosis not present

## 2019-12-20 DIAGNOSIS — Z7901 Long term (current) use of anticoagulants: Secondary | ICD-10-CM | POA: Diagnosis not present

## 2019-12-20 DIAGNOSIS — Z7984 Long term (current) use of oral hypoglycemic drugs: Secondary | ICD-10-CM | POA: Diagnosis not present

## 2019-12-20 DIAGNOSIS — R319 Hematuria, unspecified: Secondary | ICD-10-CM | POA: Insufficient documentation

## 2019-12-20 DIAGNOSIS — I428 Other cardiomyopathies: Secondary | ICD-10-CM | POA: Diagnosis not present

## 2019-12-20 DIAGNOSIS — J449 Chronic obstructive pulmonary disease, unspecified: Secondary | ICD-10-CM | POA: Diagnosis not present

## 2019-12-20 DIAGNOSIS — R059 Cough, unspecified: Secondary | ICD-10-CM

## 2019-12-20 DIAGNOSIS — I130 Hypertensive heart and chronic kidney disease with heart failure and stage 1 through stage 4 chronic kidney disease, or unspecified chronic kidney disease: Secondary | ICD-10-CM | POA: Diagnosis not present

## 2019-12-20 DIAGNOSIS — I480 Paroxysmal atrial fibrillation: Secondary | ICD-10-CM | POA: Diagnosis not present

## 2019-12-20 DIAGNOSIS — E1122 Type 2 diabetes mellitus with diabetic chronic kidney disease: Secondary | ICD-10-CM | POA: Diagnosis not present

## 2019-12-20 DIAGNOSIS — N183 Chronic kidney disease, stage 3 unspecified: Secondary | ICD-10-CM | POA: Diagnosis not present

## 2019-12-20 DIAGNOSIS — Z79899 Other long term (current) drug therapy: Secondary | ICD-10-CM | POA: Diagnosis not present

## 2019-12-20 DIAGNOSIS — J9611 Chronic respiratory failure with hypoxia: Secondary | ICD-10-CM | POA: Insufficient documentation

## 2019-12-20 DIAGNOSIS — Z9981 Dependence on supplemental oxygen: Secondary | ICD-10-CM | POA: Diagnosis not present

## 2019-12-20 DIAGNOSIS — I5022 Chronic systolic (congestive) heart failure: Secondary | ICD-10-CM | POA: Insufficient documentation

## 2019-12-20 DIAGNOSIS — Z955 Presence of coronary angioplasty implant and graft: Secondary | ICD-10-CM | POA: Insufficient documentation

## 2019-12-20 DIAGNOSIS — Z95810 Presence of automatic (implantable) cardiac defibrillator: Secondary | ICD-10-CM | POA: Diagnosis not present

## 2019-12-20 DIAGNOSIS — I440 Atrioventricular block, first degree: Secondary | ICD-10-CM | POA: Insufficient documentation

## 2019-12-20 DIAGNOSIS — Z8249 Family history of ischemic heart disease and other diseases of the circulatory system: Secondary | ICD-10-CM | POA: Diagnosis not present

## 2019-12-20 LAB — BASIC METABOLIC PANEL
Anion gap: 11 (ref 5–15)
BUN: 35 mg/dL — ABNORMAL HIGH (ref 8–23)
CO2: 24 mmol/L (ref 22–32)
Calcium: 8.8 mg/dL — ABNORMAL LOW (ref 8.9–10.3)
Chloride: 102 mmol/L (ref 98–111)
Creatinine, Ser: 1.37 mg/dL — ABNORMAL HIGH (ref 0.61–1.24)
GFR calc Af Amer: 55 mL/min — ABNORMAL LOW (ref >60)
GFR calc non Af Amer: 47 mL/min — ABNORMAL LOW (ref >60)
Glucose, Bld: 94 mg/dL (ref 70–99)
Potassium: 4 mmol/L (ref 3.5–5.1)
Sodium: 137 mmol/L (ref 135–145)

## 2019-12-20 LAB — CBC
HCT: 30.5 % — ABNORMAL LOW (ref 39.0–52.0)
Hemoglobin: 9.6 g/dL — ABNORMAL LOW (ref 13.0–17.0)
MCH: 30.2 pg (ref 26.0–34.0)
MCHC: 31.5 g/dL (ref 30.0–36.0)
MCV: 95.9 fL (ref 80.0–100.0)
Platelets: 237 10*3/uL (ref 150–400)
RBC: 3.18 MIL/uL — ABNORMAL LOW (ref 4.22–5.81)
RDW: 14.7 % (ref 11.5–15.5)
WBC: 6.5 10*3/uL (ref 4.0–10.5)
nRBC: 0 % (ref 0.0–0.2)

## 2019-12-20 LAB — URINALYSIS, ROUTINE W REFLEX MICROSCOPIC
Bilirubin Urine: NEGATIVE
Glucose, UA: NEGATIVE mg/dL
Ketones, ur: NEGATIVE mg/dL
Nitrite: NEGATIVE
Protein, ur: 100 mg/dL — AB
RBC / HPF: >50 RBC/hpf — ABNORMAL HIGH (ref 0–5)
Specific Gravity, Urine: 1.012 (ref 1.005–1.030)
WBC, UA: >50 WBC/hpf — ABNORMAL HIGH (ref 0–5)
pH: 6 (ref 5.0–8.0)

## 2019-12-20 MED ORDER — ISOSORBIDE MONONITRATE ER 60 MG PO TB24: 90.0000 mg | ORAL_TABLET | Freq: Every day | ORAL | 3 refills | Status: DC

## 2019-12-20 MED ORDER — HYDRALAZINE HCL 50 MG PO TABS: 75.0000 mg | ORAL_TABLET | Freq: Three times a day (TID) | ORAL | 3 refills | Status: DC

## 2019-12-20 MED ORDER — CEFPODOXIME PROXETIL 100 MG PO TABS: 100.0000 mg | ORAL_TABLET | Freq: Two times a day (BID) | ORAL | 0 refills | Status: DC

## 2019-12-20 NOTE — Patient Instructions (Addendum)
INCREASE Hydralazine to 75mg  (1.5 tabs) three times a day   INCREASE Imdur 90mg  (1.5 tabs) daily   Your physician has requested that you have an echocardiogram. Echocardiography is a painless test that uses sound waves to create images of your heart. It provides your doctor with information about the size and shape of your heart and how well your heart's chambers and valves are working. This procedure takes approximately one hour. There are no restrictions for this procedure.   A urine test was done today. You will get a call to schedule this appointment.   Labs and chest xray today  We will only contact you if something comes back abnormal or we need to make some changes. Otherwise no news is good news!   Your physician recommends that you schedule a follow-up appointment in: 3 months with Dr   Please call office at [phone] option 2 if you have any questions or concerns.    At the [facility], you and your health needs are our priority. As part of our continuing mission to provide you with exceptional heart care, we have created designated Provider Care Teams. These Care Teams include your primary Cardiologist (physician) and Advanced Practice Providers (APPs- Physician Assistants and Nurse Practitioners) who all work together to provide you with the care you need, when you need it.   You may see any of the following providers on your designated Care Team at your next follow up: Shirlee [address] [phone] . Dr Marland Kitchen . Arvilla Meres, NP . [name], PA . Tonye Becket, PharmD   Please be sure to bring in all your medications bottles to every appointment.

## 2019-12-20 NOTE — H&P (View-Only) (Signed)
   Date:  12/20/2019   ID:  Ryan Macdonald, DOB 05/27/1934, MRN [id number]   Provider location: [facility] Type of Visit: Established patient   PCP:  [facility]; Dr. Lombardo  Cardiologist:  Dr. Lisanne Ponce   History of Present Illness: Ryan Macdonald is a 85 y.o. male who has a history ofsystolic HF due toNICM, Biotronic ICD, A Fibon Eliquis, DMII, HTN,CAD s/p pLAD stent in 07/2018 with residual non-obstructive disease,chronic hypoxic respiratory failure on home O2 PRN,and depression.  Admitted 12/26 - 11/28/18 with A/C systolic CHF. Diuresed 21 lbs and meds adjusted as tolerated. Echo showed EF 20-25%. EP was consulted for RV pacing and ICD settings were adjusted => patient was reprogrammed to VDD, now with narrow QRS with pacing.   Amiodarone was stopped with marked hypothyroidism.   Patient returns for followup of CHF.  He seems to be stable clinically.  He is not short of breath walking on flat ground.  He has a chronic dry cough.  No fever.  No chest pain.  No orthopnea/PND.  No BRBPR/melena.  Weight is down 15 lbs, he has been working on his diet.  No BRBPR/melena.   Labs (4/20): K 4.1, creatinine 2.23, LDL 58 Labs (6/20): TSH 43, K 4.6, creatinine 1.67 Labs (8/20): K 5.1, creatinine 1.66 Labs (10/20): K 4.9 => 5, creatinine 1.89 => 1.5, LDL 65, HDL 32, hgb 10.4  ECG (personally reviewed): NSR, 1st degree AVB 238 msec, lateral TWIs   PMH: 1. Atrial fibrillation: Paroxysmal. 2. CKD: Stage 3. 3. Type 2 diabetes 4. Hyperlipidemia 5. HTN 6. Nephrolithiasis 7. ?NAFLD 8. Gout  9. CAD: LHC (9/19) with 40% distal LM, 50% ostial LAD, 50% mid LAD, 75% proximal LAD treated with DES.  10. Chronic hypoxemic respiratory failure. Has home oxygen. ?COPD: Prior smoker.  11. CHF: EF low since 2014. Primarily nonischemic cardiomyopathy. Biotronik ICD. - Echo (12/19) with EF 20-25%, diffuse hypokinesis, moderate MR, mildly dilated RV with moderately  decreased systolic function, PASP 39 mmHg.  - ACEI angioedema.  12. Hypothyroidism   Current Outpatient Medications  Medication Sig Dispense Refill  . allopurinol (ZYLOPRIM) 100 MG tablet Take 200 mg by mouth daily as needed (for gout symptoms).     . apixaban (ELIQUIS) 2.5 MG TABS tablet Take 1 tablet (2.5 mg total) by mouth 2 (two) times daily. 90 tablet 0  . cyanocobalamin 1000 MCG tablet Take 1 tablet (1,000 mcg total) by mouth daily. 90 tablet 0  . diclofenac sodium (VOLTAREN) 1 % GEL Apply 4 g topically 4 (four) times daily. 1 Tube 0  . docusate sodium (COLACE) 100 MG capsule Take 100 mg by mouth 2 (two) times daily.    . finasteride (PROSCAR) 5 MG tablet Take 1 tablet (5 mg total) by mouth daily. 30 tablet 0  . glipiZIDE (GLUCOTROL) 5 MG tablet Take 5 mg by mouth daily before breakfast.    . hydrALAZINE (APRESOLINE) 50 MG tablet Take 1.5 tablets (75 mg total) by mouth 3 (three) times daily. 405 tablet 3  . HYDROcodone-acetaminophen (NORCO/VICODIN) 5-325 MG tablet Take 2 tablets by mouth every 4 (four) hours as needed. 6 tablet 0  . ipratropium-albuterol (DUONEB) 0.5-2.5 (3) MG/3ML SOLN Take 3 mLs by nebulization every 4 (four) hours as needed. 360 mL 0  . isosorbide mononitrate (IMDUR) 60 MG 24 hr tablet Take 1.5 tablets (90 mg total) by mouth daily. 135 tablet 3  . ketoconazole (NIZORAL) 2 % shampoo Apply 1 application topically 2 (  two) times a week.    . levothyroxine (SYNTHROID) 125 MCG tablet Take 1 tablet (125 mcg total) by mouth daily before breakfast. 90 tablet 1  . magnesium oxide (MAG-OX) 400 MG tablet Take 400 mg by mouth daily.    . methocarbamol (ROBAXIN) 500 MG tablet Take 1 tablet (500 mg total) by mouth every 6 (six) hours as needed for muscle spasms. 30 tablet 0  . nitroGLYCERIN (NITROSTAT) 0.4 MG SL tablet Place 1 tablet (0.4 mg total) under the tongue every 5 (five) minutes x 3 doses as needed for chest pain. 25 tablet 0  . rosuvastatin (CRESTOR) 10 MG tablet Take 1  tablet (10 mg total) by mouth daily at 6 PM. 30 tablet 5  . sertraline (ZOLOFT) 25 MG tablet Take 25 mg by mouth daily.     . spironolactone (ALDACTONE) 25 MG tablet Take 1 tablet (25 mg total) by mouth daily. 30 tablet 6  . tamsulosin (FLOMAX) 0.4 MG CAPS capsule Take 0.4 mg by mouth at bedtime.    . torsemide (DEMADEX) 20 MG tablet Take 2 tablets (40 mg total) by mouth daily.    . cefpodoxime (VANTIN) 100 MG tablet Take 1 tablet (100 mg total) by mouth 2 (two) times daily for 14 days. 28 tablet 0   No current facility-administered medications for this encounter.    Allergies:   Lisinopril and Xarelto [rivaroxaban]   Social History:  The patient  reports that he has quit smoking. His smoking use included cigarettes and cigars. He has a 15.00 pack-year smoking history. He has quit using smokeless tobacco. He reports that he does not drink alcohol or use drugs.   Family History:  The patient's family history includes Heart Problems in his maternal grandmother; Heart disease in his mother; Other in an other family member.   ROS:  Please see the history of present illness.   All other systems are personally reviewed and negative.   Exam:  BP 133/73   Pulse 88   Wt 89.4 kg (197 lb)   SpO2 99%   BMI 30.85 kg/m  General: NAD Neck: No JVD, no thyromegaly or thyroid nodule.  Lungs: Clear to auscultation bilaterally with normal respiratory effort. CV: Nondisplaced PMI.  Heart regular S1/S2, no S3/S4, no murmur.  No peripheral edema.  No carotid bruit.  Normal pedal pulses.  Abdomen: Soft, nontender, no hepatosplenomegaly, no distention.  Skin: Intact without lesions or rashes.  Neurologic: Alert and oriented x 3.  Psych: Normal affect. Extremities: No clubbing or cyanosis.  HEENT: Normal.   Recent Labs: 03/10/2019: Magnesium 1.6 06/13/2019: TSH 3.523 08/10/2019: ALT 42 12/20/2019: BUN 35; Creatinine, Ser 1.37; Hemoglobin 9.6; Platelets 237; Potassium 4.0; Sodium 137  Personally reviewed    Wt Readings from Last 3 Encounters:  12/20/19 89.4 kg (197 lb)  10/22/19 91.2 kg (201 lb)  10/01/19 87.5 kg (193 lb)      ASSESSMENT AND PLAN:  1. Chronic systolic CHF: Primarily nonischemic cardiomyopathy. EF has been low since at least 2014.  He has single chamber Biotronik ICD. QRS not wide enough for CRT.Settings adjusted in the past to prevent RV pacing in the face of a long 1st degree AV block, he is not pacing today.  Most recent echo in 12/19 with EF 20-25%, diffuse hypokinesis, moderate RV systolic dysfunction. NYHA class II symptoms.  He does not appear volume overloaded on exam, NYHA class II symptoms.      - Continue torsemide 40 mg daily, BMET today.    -   Continue spironolactone 25 mg qhs.   - Increase hydralazine to 75 mg tid and Imdur to 90 mg daily. Bidil [facility].   -Have been holding off on beta blocker given long 1st degree AVB to limit RV pacing.  -No ACEI or ARNI with angioedema on lisinopril. - Would hold off on ARB with CKD stage 3 and h/o AKI.  - I will get a CXR given chronic cough => CXR showed no acute cardiopulmonary findings.  - Arrange for echo at followup.  2. CAD: S/p PCI 07/2018 to pLAD but CAD does not explain his cardiomyopathy.  No chest pain.  - He is off Plavix now, on Eliquis for his atrial fibrillation.  - Continue Crestor 3.Atrial fibrillation: Paroxysmal.  He is in NSR today.   - Continue Eliquis 2.5 mg bid (age, creatinine > 1.5).  - He is now off amiodarone with marked hypothyroidism.  4. CKD: Stage 3.  BMET today.  5. Hypothyroidism: Suspect this is due to amiodarone.  Levoxyl per PCP.   6. Hematuria: Patient reports hematuria.  I will get a UA.   Followup in 3 months with echo   Signed, Ahley Bulls, MD  12/20/2019  [facility] [address] [facility] [place] [address] [phone] (office) [phone] (fax) 

## 2019-12-20 NOTE — Progress Notes (Signed)
Came out today to f/u to fill boxes.  Pt was asleep in the bedroom, I filled up 2 pill boxes.  His wife was there and she advised he was very tired as he hasnt slept well in the last couple days helping her.   [name], EMT-Paramedic  12/20/19

## 2019-12-20 NOTE — Progress Notes (Signed)
Date:  12/20/2019   ID:  [name], DOB 1934/06/01, MRN [id number]   Provider location: [facility] Type of Visit: Established patient   PCP:  [facility]; Dr. Renelda Mom  Cardiologist:  Dr. Aundra Dubin   History of Present Illness: Ryan Macdonald is a 86 y.o. male who has a history ofsystolic HF due toNICM, Biotronic ICD, A Fibon Eliquis, DMII, HTN,CAD s/p pLAD stent in 07/2018 with residual non-obstructive disease,chronic hypoxic respiratory failure on home O2 PRN,and depression.  Admitted 12/26 - 19/14/78 with A/C systolic CHF. Diuresed 21 lbs and meds adjusted as tolerated. Echo showed EF 20-25%. EP was consulted for RV pacing and ICD settings were adjusted => patient was reprogrammed to VDD, now with narrow QRS with pacing.   Amiodarone was stopped with marked hypothyroidism.   Patient returns for followup of CHF.  He seems to be stable clinically.  He is not short of breath walking on flat ground.  He has a chronic dry cough.  No fever.  No chest pain.  No orthopnea/PND.  No BRBPR/melena.  Weight is down 15 lbs, he has been working on his diet.  No BRBPR/melena.   Labs (4/20): K 4.1, creatinine 2.23, LDL 58 Labs (6/20): TSH 43, K 4.6, creatinine 1.67 Labs (8/20): K 5.1, creatinine 1.66 Labs (10/20): K 4.9 => 5, creatinine 1.89 => 1.5, LDL 65, HDL 32, hgb 10.4  ECG (personally reviewed): NSR, 1st degree AVB 238 msec, lateral TWIs   PMH: 1. Atrial fibrillation: Paroxysmal. 2. CKD: Stage 3. 3. Type 2 diabetes 4. Hyperlipidemia 5. HTN 6. Nephrolithiasis 7. ?NAFLD 8. Gout  9. CAD: LHC (9/19) with 40% distal LM, 50% ostial LAD, 50% mid LAD, 75% proximal LAD treated with DES.  10. Chronic hypoxemic respiratory failure. Has home oxygen. ?COPD: Prior smoker.  11. CHF: EF low since 2014. Primarily nonischemic cardiomyopathy. Biotronik ICD. - Echo (12/19) with EF 20-25%, diffuse hypokinesis, moderate MR, mildly dilated RV with moderately  decreased systolic function, PASP 39 mmHg.  - ACEI angioedema.  12. Hypothyroidism   Current Outpatient Medications  Medication Sig Dispense Refill  . allopurinol (ZYLOPRIM) 100 MG tablet Take 200 mg by mouth daily as needed (for gout symptoms).     Marland Kitchen apixaban (ELIQUIS) 2.5 MG TABS tablet Take 1 tablet (2.5 mg total) by mouth 2 (two) times daily. 90 tablet 0  . cyanocobalamin 1000 MCG tablet Take 1 tablet (1,000 mcg total) by mouth daily. 90 tablet 0  . diclofenac sodium (VOLTAREN) 1 % GEL Apply 4 g topically 4 (four) times daily. 1 Tube 0  . docusate sodium (COLACE) 100 MG capsule Take 100 mg by mouth 2 (two) times daily.    . finasteride (PROSCAR) 5 MG tablet Take 1 tablet (5 mg total) by mouth daily. 30 tablet 0  . glipiZIDE (GLUCOTROL) 5 MG tablet Take 5 mg by mouth daily before breakfast.    . hydrALAZINE (APRESOLINE) 50 MG tablet Take 1.5 tablets (75 mg total) by mouth 3 (three) times daily. 405 tablet 3  . HYDROcodone-acetaminophen (NORCO/VICODIN) 5-325 MG tablet Take 2 tablets by mouth every 4 (four) hours as needed. 6 tablet 0  . ipratropium-albuterol (DUONEB) 0.5-2.5 (3) MG/3ML SOLN Take 3 mLs by nebulization every 4 (four) hours as needed. 360 mL 0  . isosorbide mononitrate (IMDUR) 60 MG 24 hr tablet Take 1.5 tablets (90 mg total) by mouth daily. 135 tablet 3  . ketoconazole (NIZORAL) 2 % shampoo Apply 1 application topically 2 (  two) times a week.    . levothyroxine (SYNTHROID) 125 MCG tablet Take 1 tablet (125 mcg total) by mouth daily before breakfast. 90 tablet 1  . magnesium oxide (MAG-OX) 400 MG tablet Take 400 mg by mouth daily.    . methocarbamol (ROBAXIN) 500 MG tablet Take 1 tablet (500 mg total) by mouth every 6 (six) hours as needed for muscle spasms. 30 tablet 0  . nitroGLYCERIN (NITROSTAT) 0.4 MG SL tablet Place 1 tablet (0.4 mg total) under the tongue every 5 (five) minutes x 3 doses as needed for chest pain. 25 tablet 0  . rosuvastatin (CRESTOR) 10 MG tablet Take 1  tablet (10 mg total) by mouth daily at 6 PM. 30 tablet 5  . sertraline (ZOLOFT) 25 MG tablet Take 25 mg by mouth daily.     Marland Kitchen spironolactone (ALDACTONE) 25 MG tablet Take 1 tablet (25 mg total) by mouth daily. 30 tablet 6  . tamsulosin (FLOMAX) 0.4 MG CAPS capsule Take 0.4 mg by mouth at bedtime.    . torsemide (DEMADEX) 20 MG tablet Take 2 tablets (40 mg total) by mouth daily.    . cefpodoxime (VANTIN) 100 MG tablet Take 1 tablet (100 mg total) by mouth 2 (two) times daily for 14 days. 28 tablet 0   No current facility-administered medications for this encounter.    Allergies:   Lisinopril and Xarelto [rivaroxaban]   Social History:  The patient  reports that he has quit smoking. His smoking use included cigarettes and cigars. He has a 15.00 pack-year smoking history. He has quit using smokeless tobacco. He reports that he does not drink alcohol or use drugs.   Family History:  The patient's family history includes Heart Problems in his maternal grandmother; Heart disease in his mother; Other in an other family member.   ROS:  Please see the history of present illness.   All other systems are personally reviewed and negative.   Exam:  BP 133/73   Pulse 88   Wt 89.4 kg (197 lb)   SpO2 99%   BMI 30.85 kg/m  General: NAD Neck: No JVD, no thyromegaly or thyroid nodule.  Lungs: Clear to auscultation bilaterally with normal respiratory effort. CV: Nondisplaced PMI.  Heart regular S1/S2, no S3/S4, no murmur.  No peripheral edema.  No carotid bruit.  Normal pedal pulses.  Abdomen: Soft, nontender, no hepatosplenomegaly, no distention.  Skin: Intact without lesions or rashes.  Neurologic: Alert and oriented x 3.  Psych: Normal affect. Extremities: No clubbing or cyanosis.  HEENT: Normal.   Recent Labs: 03/10/2019: Magnesium 1.6 06/13/2019: TSH 3.523 08/10/2019: ALT 42 12/20/2019: BUN 35; Creatinine, Ser 1.37; Hemoglobin 9.6; Platelets 237; Potassium 4.0; Sodium 137  Personally reviewed    Wt Readings from Last 3 Encounters:  12/20/19 89.4 kg (197 lb)  10/22/19 91.2 kg (201 lb)  10/01/19 87.5 kg (193 lb)      ASSESSMENT AND PLAN:  1. Chronic systolic CHF: Primarily nonischemic cardiomyopathy. EF has been low since at least 2014.  He has single chamber Biotronik ICD. QRS not wide enough for CRT.Settings adjusted in the past to prevent RV pacing in the face of a long 1st degree AV block, he is not pacing today.  Most recent echo in 12/19 with EF 20-25%, diffuse hypokinesis, moderate RV systolic dysfunction. NYHA class II symptoms.  He does not appear volume overloaded on exam, NYHA class II symptoms.      - Continue torsemide 40 mg daily, BMET today.    -  Continue spironolactone 25 mg qhs.   - Increase hydralazine to 75 mg tid and Imdur to 90 mg daily. Bidil not covered [facility].   -Have been holding off on beta blocker given long 1st degree AVB to limit RV pacing.  -No ACEI or ARNI with angioedema on lisinopril. - Would hold off on ARB with CKD stage 3 and h/o AKI.  - I will get a CXR given chronic cough => CXR showed no acute cardiopulmonary findings.  - Arrange for echo at followup.  2. CAD: S/p PCI 07/2018 to pLAD but CAD does not explain his cardiomyopathy.  No chest pain.  - He is off Plavix now, on Eliquis for his atrial fibrillation.  - Continue Crestor 3.Atrial fibrillation: Paroxysmal.  He is in NSR today.   - Continue Eliquis 2.5 mg bid (age, creatinine > 1.5).  - He is now off amiodarone with marked hypothyroidism.  4. CKD: Stage 3.  BMET today.  5. Hypothyroidism: Suspect this is due to amiodarone.  Levoxyl per PCP.   6. Hematuria: Patient reports hematuria.  I will get a UA.   Followup in 3 months with echo   Signed, [name], MD  12/20/2019  [facility] [address] [facility] [place] [address] [phone] (office) [phone] (fax)

## 2019-12-20 NOTE — Telephone Encounter (Signed)
-----   Message from Laurey Morale, MD sent at 12/20/2019  3:57 PM EST ----- Send to his PCP at [facility] but will give him a course of cefpodoxime 100 mg bid x 2 wks for possible UTI in male.

## 2019-12-20 NOTE — Progress Notes (Signed)
Paramedicine Encounter   Patient ID: Ryan Macdonald , male,   DOB: [birth year],85 y.o.,  MRN: [id number]   Met patient in clinic today with provider.   B/p-133/73 p-88 Weight @ clinic-197 sp02-99%  Pt reports he is doing ok. His meds are being increased-hydralazine to 94m TID and isosorbide 967mdaily.  Will f/u with him today later on to make those med changes in pill boxes. EKG, labs and chest xray done today.   Ka[place]louise StacksEM[place]/21/2021

## 2019-12-21 ENCOUNTER — Telehealth (HOSPITAL_COMMUNITY): Payer: Self-pay

## 2019-12-21 ENCOUNTER — Ambulatory Visit (INDEPENDENT_AMBULATORY_CARE_PROVIDER_SITE_OTHER): Payer: No Typology Code available for payment source | Admitting: *Deleted

## 2019-12-21 DIAGNOSIS — I5043 Acute on chronic combined systolic (congestive) and diastolic (congestive) heart failure: Secondary | ICD-10-CM | POA: Diagnosis not present

## 2019-12-21 LAB — CUP PACEART REMOTE DEVICE CHECK
Date Time Interrogation Session: 20210122063606
Implantable Lead Implant Date: 20161221
Implantable Lead Location: 753860
Implantable Lead Model: 365501
Implantable Lead Serial Number: 10600411
Implantable Pulse Generator Implant Date: 20161221
Pulse Gen Model: 399436
Pulse Gen Serial Number: 60886010

## 2019-12-21 NOTE — Telephone Encounter (Signed)
Called Patient to see if he has the correct fax number for Dr Tereasa Coop @ [facility], to send a new Rx and last ov notes. Asked Patient to return call as soon as possible.

## 2019-12-21 NOTE — Telephone Encounter (Signed)
New prescriptions for imdur and hydralazine faxed to [facility] with signed office note. MD changed dose while patient in office yesterday.  Office note was not ready yesterday to fax Rx.

## 2019-12-24 ENCOUNTER — Other Ambulatory Visit (HOSPITAL_COMMUNITY): Payer: Self-pay | Admitting: *Deleted

## 2019-12-24 MED ORDER — CEFPODOXIME PROXETIL 100 MG PO TABS: 100.0000 mg | ORAL_TABLET | Freq: Two times a day (BID) | ORAL | 0 refills | Status: AC

## 2019-12-24 NOTE — Telephone Encounter (Signed)
Original fax for medications unsuccessful.  Made multiple attempts to fax however continues to fail.  Called the [place], unable to reach a person to get confirmation of fax for Dr Tereasa Coop. Faxed to number associated with pharmacy in snapshot. [phone].  Confirmation received.

## 2020-01-02 ENCOUNTER — Telehealth (HOSPITAL_COMMUNITY): Payer: Self-pay
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
## 2020-01-03 ENCOUNTER — Other Ambulatory Visit (HOSPITAL_COMMUNITY): Payer: Self-pay

## 2020-01-03 NOTE — Progress Notes (Signed)
Paramedicine Encounter    Patient ID: Ryan Macdonald, male    DOB: [birth year], 86 y.o.   MRN: [id number]   Patient Care Team: Center, [facility] as PCP - General (General Practice) [name], MD as PCP - Cardiology (Cardiology) Laurey Morale, MD as PCP - Advanced Heart Failure (Cardiology)  Patient Active Problem List   Diagnosis Date Noted  . AKI (acute kidney injury) (HCC) 03/14/2019  . Spasm of muscle of lower back 03/14/2019  . Acute renal failure (ARF) (HCC) 03/09/2019  . Transaminitis   . Acute on chronic renal failure (HCC) 03/08/2019  . Acute exacerbation of CHF (congestive heart failure) (HCC) 11/23/2018  . Acute on chronic heart failure (HCC) 10/19/2018  . Pulmonary emphysema (HCC)   . Chronic respiratory failure with hypoxia (HCC)   . Morbid obesity (HCC) 09/09/2018  . URI (upper respiratory infection) 08/31/2018  . UTI (urinary tract infection) 08/31/2018  . Chronic systolic (congestive) heart failure (HCC) 08/28/2018  . CHF (congestive heart failure) (HCC) 08/14/2018  . Acute on chronic combined systolic and diastolic CHF (congestive heart failure) (HCC) 07/18/2018  . First degree AV block   . Shortness of breath 04/29/2018  . Acute HFrEF (heart failure with reduced ejection fraction) (HCC) 04/29/2018  . Acute respiratory failure with hypoxia (HCC) 09/27/2017  . CHF exacerbation (HCC) 09/27/2017  . Renal insufficiency   . Acute on chronic systolic CHF (congestive heart failure) (HCC) 07/08/2017  . Left knee pain 07/08/2017  . Acute bronchitis 05/10/2017  . Acute congestive heart failure (HCC) 04/14/2017  . Elevated troponin 04/14/2017  . Lactic acidosis   . Acute on chronic systolic heart failure, NYHA class 2 (HCC) 02/03/2017  . Diabetes mellitus type 2 in obese (HCC) 02/03/2017  . CAP (community acquired pneumonia) 02/03/2017  . Lobar pneumonia (HCC)   . Diabetes mellitus with complication (HCC)   . Acute on chronic respiratory failure with  hypoxia (HCC)   . Acute on chronic combined systolic and diastolic heart failure (HCC) 05/19/2016  . Depression 05/19/2016  . Gout 05/19/2016  . 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 NSVT (nonsustained ventricular tachycardia) (HCC)   . Chronic renal insufficiency, stage 3 (moderate) (HCC)   . Hyperkalemia 08/21/2015  . Pulmonary embolism (HCC)   . Acute respiratory failure (HCC)   . Osteoarthritis of left hip 10/25/2013  . Atrial fibrillation (HCC) 10/25/2013  . S/P hip replacement 10/24/2013  . Nonischemic cardiomyopathy (HCC)   . Obesity (BMI 30-39.9)   . Essential hypertension   . Hyperlipidemia     Current Outpatient Medications:  .  allopurinol (ZYLOPRIM) 100 MG tablet, Take 200 mg by mouth daily as needed (for gout symptoms). , Disp: , Rfl:  .  apixaban (ELIQUIS) 2.5 MG TABS tablet, Take 1 tablet (2.5 mg total) by mouth 2 (two) times daily., Disp: 90 tablet, Rfl: 0 .  cefpodoxime (VANTIN) 100 MG tablet, Take 1 tablet (100 mg total) by mouth 2 (two) times daily for 14 days., Disp: 28 tablet, Rfl: 0 .  cyanocobalamin 1000 MCG tablet, Take 1 tablet (1,000 mcg total) by mouth daily., Disp: 90 tablet, Rfl: 0 .  diclofenac sodium (VOLTAREN) 1 % GEL, Apply 4 g topically 4 (four) times daily., Disp: 1 Tube, Rfl: 0 .  docusate sodium (COLACE) 100 MG capsule, Take 100 mg by mouth 2 (two) times daily., Disp: , Rfl:  .  finasteride (PROSCAR) 5 MG tablet, Take 1 tablet (5 mg total) by mouth daily., Disp: 30 tablet, Rfl:  0 .  glipiZIDE (GLUCOTROL) 5 MG tablet, Take 5 mg by mouth daily before breakfast., Disp: , Rfl:  .  hydrALAZINE (APRESOLINE) 50 MG tablet, Take 1.5 tablets (75 mg total) by mouth 3 (three) times daily., Disp: 405 tablet, Rfl: 3 .  HYDROcodone-acetaminophen (NORCO/VICODIN) 5-325 MG tablet, Take 2 tablets by mouth every 4 (four) hours as needed., Disp: 6 tablet, Rfl: 0 .  ipratropium-albuterol (DUONEB) 0.5-2.5 (3) MG/3ML SOLN, Take 3 mLs by  nebulization every 4 (four) hours as needed., Disp: 360 mL, Rfl: 0 .  isosorbide mononitrate (IMDUR) 60 MG 24 hr tablet, Take 1.5 tablets (90 mg total) by mouth daily., Disp: 135 tablet, Rfl: 3 .  ketoconazole (NIZORAL) 2 % shampoo, Apply 1 application topically 2 (two) times a week., Disp: , Rfl:  .  levothyroxine (SYNTHROID) 125 MCG tablet, Take 1 tablet (125 mcg total) by mouth daily before breakfast., Disp: 90 tablet, Rfl: 1 .  magnesium oxide (MAG-OX) 400 MG tablet, Take 400 mg by mouth daily., Disp: , Rfl:  .  methocarbamol (ROBAXIN) 500 MG tablet, Take 1 tablet (500 mg total) by mouth every 6 (six) hours as needed for muscle spasms., Disp: 30 tablet, Rfl: 0 .  rosuvastatin (CRESTOR) 10 MG tablet, Take 1 tablet (10 mg total) by mouth daily at 6 PM., Disp: 30 tablet, Rfl: 5 .  sertraline (ZOLOFT) 25 MG tablet, Take 25 mg by mouth daily. , Disp: , Rfl:  .  spironolactone (ALDACTONE) 25 MG tablet, Take 1 tablet (25 mg total) by mouth daily., Disp: 30 tablet, Rfl: 6 .  tamsulosin (FLOMAX) 0.4 MG CAPS capsule, Take 0.4 mg by mouth at bedtime., Disp: , Rfl:  .  torsemide (DEMADEX) 20 MG tablet, Take 2 tablets (40 mg total) by mouth daily., Disp: , Rfl:  .  nitroGLYCERIN (NITROSTAT) 0.4 MG SL tablet, Place 1 tablet (0.4 mg total) under the tongue every 5 (five) minutes x 3 doses as needed for chest pain. (Patient not taking: Reported on 01/03/2020), Disp: 25 tablet, Rfl: 0 Allergies  Allergen Reactions  . Lisinopril Swelling    Facial swelling  . Xarelto [Rivaroxaban] Other (See Comments)    "Blood came out of my penis"      Social History   Socioeconomic History  . Marital status: Married    Spouse name: Not on file  . Number of children: Not on file  . Years of education: Not on file  . Highest education level: Not on file  Occupational History  . Occupation: Retired    Comment: Former Public house manager  Tobacco Use  . Smoking status: Former Smoker    Packs/day: 1.00    Years: 15.00    Pack  years: 15.00    Types: Cigarettes, Cigars  . Smokeless tobacco: Former Neurosurgeon  . Tobacco comment: quit smoking ~ 50 yr ago  Substance and Sexual Activity  . Alcohol use: No    Alcohol/week: 2.0 standard drinks    Types: 2 Cans of beer per week    Comment: rare beer.  . Drug use: No  . Sexual activity: Yes  Other Topics Concern  . Not on file  Social History Narrative   Lives in [place] with wife. Moved from [place] (2013). Does not routinely exercise.   Social Determinants of Health   Financial Resource Strain:   . Difficulty of Paying Living Expenses: Not on file  Food Insecurity:   . Worried About Programme researcher, broadcasting/film/video in the Last Year: Not on file  .  Ran Out of Food in the Last Year: Not on file  Transportation Needs:   . Lack of Transportation (Medical): Not on file  . Lack of Transportation (Non-Medical): Not on file  Physical Activity:   . Days of Exercise per Week: Not on file  . Minutes of Exercise per Session: Not on file  Stress:   . Feeling of Stress : Not on file  Social Connections:   . Frequency of Communication with Friends and Family: Not on file  . Frequency of Social Gatherings with Friends and Family: Not on file  . Attends Religious Services: Not on file  . Active Member of Clubs or Organizations: Not on file  . Attends Archivist Meetings: Not on file  . Marital Status: Not on file  Intimate Partner Violence:   . Fear of Current or Ex-Partner: Not on file  . Emotionally Abused: Not on file  . Physically Abused: Not on file  . Sexually Abused: Not on file    Physical Exam      Future Appointments  Date Time Provider [place]  01/28/2020  1:45 PM Edrick Kins, DPM [facility]  03/21/2020  7:35 AM [facility] DEVICE REMOTES [facility]  03/27/2020 11:00 AM MC ECHO OP 1 [facility]  03/27/2020 12:00 PM Larey Dresser, MD [facility] None  06/20/2020  7:35 AM [facility] DEVICE REMOTES [facility]  09/19/2020  7:35 AM [facility] DEVICE REMOTES [facility]    BP 130/64   Pulse 98   Temp 98.6 F (37 C)   Resp 18   Wt 199 lb (90.3 kg)   SpO2 96%   BMI 31.17 kg/m   Weight yesterday-201 Last visit weight-197  Pt reports he is doing better. He denies increased sob. No dizziness, no c/p. He is using compression stockings and they are working well.  They are back in the apt now. Something that [employer] had to do and they couldn't stay in the apt due to loss of power.  He misses numerous doses of med through out the 2 wks.  I encouraged him to take all the meds.  meds verified and 2 pill boxes were filled.   Marylouise Stacks, [facility] Paramedic  01/03/20

## 2020-01-10 ENCOUNTER — Telehealth (HOSPITAL_COMMUNITY): Payer: Self-pay | Admitting: *Deleted

## 2020-01-10 ENCOUNTER — Other Ambulatory Visit (HOSPITAL_COMMUNITY): Payer: Self-pay | Admitting: *Deleted

## 2020-01-10 ENCOUNTER — Other Ambulatory Visit (HOSPITAL_COMMUNITY): Payer: Self-pay

## 2020-01-10 DIAGNOSIS — I480 Paroxysmal atrial fibrillation: Secondary | ICD-10-CM

## 2020-01-10 NOTE — Progress Notes (Signed)
Paramedicine Encounter    Patient ID: Ryan Macdonald, male    DOB: 08-20-1934, 86 y.o.   MRN: [id number]   Patient Care Team: Center, [facility] as PCP - General (General Practice) [name], MD as PCP - Cardiology (Cardiology) Laurey Morale, MD as PCP - Advanced Heart Failure (Cardiology)  Patient Active Problem List   Diagnosis Date Noted  . AKI (acute kidney injury) (HCC) 03/14/2019  . Spasm of muscle of lower back 03/14/2019  . Acute renal failure (ARF) (HCC) 03/09/2019  . Transaminitis   . Acute on chronic renal failure (HCC) 03/08/2019  . Acute exacerbation of CHF (congestive heart failure) (HCC) 11/23/2018  . Acute on chronic heart failure (HCC) 10/19/2018  . Pulmonary emphysema (HCC)   . Chronic respiratory failure with hypoxia (HCC)   . Morbid obesity (HCC) 09/09/2018  . URI (upper respiratory infection) 08/31/2018  . UTI (urinary tract infection) 08/31/2018  . Chronic systolic (congestive) heart failure (HCC) 08/28/2018  . CHF (congestive heart failure) (HCC) 08/14/2018  . Acute on chronic combined systolic and diastolic CHF (congestive heart failure) (HCC) 07/18/2018  . First degree AV block   . Shortness of breath 04/29/2018  . Acute HFrEF (heart failure with reduced ejection fraction) (HCC) 04/29/2018  . Acute respiratory failure with hypoxia (HCC) 09/27/2017  . CHF exacerbation (HCC) 09/27/2017  . Renal insufficiency   . Acute on chronic systolic CHF (congestive heart failure) (HCC) 07/08/2017  . Left knee pain 07/08/2017  . Acute bronchitis 05/10/2017  . Acute congestive heart failure (HCC) 04/14/2017  . Elevated troponin 04/14/2017  . Lactic acidosis   . Acute on chronic systolic heart failure, NYHA class 2 (HCC) 02/03/2017  . Diabetes mellitus type 2 in obese (HCC) 02/03/2017  . CAP (community acquired pneumonia) 02/03/2017  . Lobar pneumonia (HCC)   . Diabetes mellitus with complication (HCC)   . Acute on chronic respiratory failure with  hypoxia (HCC)   . Acute on chronic combined systolic and diastolic heart failure (HCC) 05/19/2016  . Depression 05/19/2016  . Gout 05/19/2016  . 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 NSVT (nonsustained ventricular tachycardia) (HCC)   . Chronic renal insufficiency, stage 3 (moderate) (HCC)   . Hyperkalemia 08/21/2015  . Pulmonary embolism (HCC)   . Acute respiratory failure (HCC)   . Osteoarthritis of left hip 10/25/2013  . Atrial fibrillation (HCC) 10/25/2013  . S/P hip replacement 10/24/2013  . Nonischemic cardiomyopathy (HCC)   . Obesity (BMI 30-39.9)   . Essential hypertension   . Hyperlipidemia     Current Outpatient Medications:  .  allopurinol (ZYLOPRIM) 100 MG tablet, Take 200 mg by mouth daily as needed (for gout symptoms). , Disp: , Rfl:  .  apixaban (ELIQUIS) 2.5 MG TABS tablet, Take 1 tablet (2.5 mg total) by mouth 2 (two) times daily., Disp: 90 tablet, Rfl: 0 .  cyanocobalamin 1000 MCG tablet, Take 1 tablet (1,000 mcg total) by mouth daily., Disp: 90 tablet, Rfl: 0 .  diclofenac sodium (VOLTAREN) 1 % GEL, Apply 4 g topically 4 (four) times daily., Disp: 1 Tube, Rfl: 0 .  docusate sodium (COLACE) 100 MG capsule, Take 100 mg by mouth 2 (two) times daily., Disp: , Rfl:  .  finasteride (PROSCAR) 5 MG tablet, Take 1 tablet (5 mg total) by mouth daily., Disp: 30 tablet, Rfl: 0 .  glipiZIDE (GLUCOTROL) 5 MG tablet, Take 5 mg by mouth daily before breakfast., Disp: , Rfl:  .  hydrALAZINE (APRESOLINE) 50 MG tablet,  Take 1.5 tablets (75 mg total) by mouth 3 (three) times daily., Disp: 405 tablet, Rfl: 3 .  HYDROcodone-acetaminophen (NORCO/VICODIN) 5-325 MG tablet, Take 2 tablets by mouth every 4 (four) hours as needed., Disp: 6 tablet, Rfl: 0 .  ipratropium-albuterol (DUONEB) 0.5-2.5 (3) MG/3ML SOLN, Take 3 mLs by nebulization every 4 (four) hours as needed., Disp: 360 mL, Rfl: 0 .  isosorbide mononitrate (IMDUR) 60 MG 24 hr tablet, Take 1.5 tablets (90  mg total) by mouth daily., Disp: 135 tablet, Rfl: 3 .  ketoconazole (NIZORAL) 2 % shampoo, Apply 1 application topically 2 (two) times a week., Disp: , Rfl:  .  levothyroxine (SYNTHROID) 125 MCG tablet, Take 1 tablet (125 mcg total) by mouth daily before breakfast., Disp: 90 tablet, Rfl: 1 .  magnesium oxide (MAG-OX) 400 MG tablet, Take 400 mg by mouth daily., Disp: , Rfl:  .  methocarbamol (ROBAXIN) 500 MG tablet, Take 1 tablet (500 mg total) by mouth every 6 (six) hours as needed for muscle spasms., Disp: 30 tablet, Rfl: 0 .  nitroGLYCERIN (NITROSTAT) 0.4 MG SL tablet, Place 1 tablet (0.4 mg total) under the tongue every 5 (five) minutes x 3 doses as needed for chest pain. (Patient not taking: Reported on 01/03/2020), Disp: 25 tablet, Rfl: 0 .  rosuvastatin (CRESTOR) 10 MG tablet, Take 1 tablet (10 mg total) by mouth daily at 6 PM., Disp: 30 tablet, Rfl: 5 .  sertraline (ZOLOFT) 25 MG tablet, Take 25 mg by mouth daily. , Disp: , Rfl:  .  spironolactone (ALDACTONE) 25 MG tablet, Take 1 tablet (25 mg total) by mouth daily., Disp: 30 tablet, Rfl: 6 .  tamsulosin (FLOMAX) 0.4 MG CAPS capsule, Take 0.4 mg by mouth at bedtime., Disp: , Rfl:  .  torsemide (DEMADEX) 20 MG tablet, Take 2 tablets (40 mg total) by mouth daily., Disp: , Rfl:  Allergies  Allergen Reactions  . Lisinopril Swelling    Facial swelling  . Xarelto [Rivaroxaban] Other (See Comments)    "Blood came out of my penis"      Social History   Socioeconomic History  . Marital status: Married    Spouse name: Not on file  . Number of children: Not on file  . Years of education: Not on file  . Highest education level: Not on file  Occupational History  . Occupation: Retired    Comment: Former Corporate treasurer  Tobacco Use  . Smoking status: Former Smoker    Packs/day: 1.00    Years: 15.00    Pack years: 15.00    Types: Cigarettes, Cigars  . Smokeless tobacco: Former Systems developer  . Tobacco comment: quit smoking ~ 50 yr ago  Substance and Sexual  Activity  . Alcohol use: No    Alcohol/week: 2.0 standard drinks    Types: 2 Cans of beer per week    Comment: rare beer.  . Drug use: No  . Sexual activity: Yes  Other Topics Concern  . Not on file  Social History Narrative   Lives in [place] with wife. Moved from [place] (2013). Does not routinely exercise.   Social Determinants of Health   Financial Resource Strain:   . Difficulty of Paying Living Expenses: Not on file  Food Insecurity:   . Worried About Charity fundraiser in the Last Year: Not on file  . Ran Out of Food in the Last Year: Not on file  Transportation Needs:   . Lack of Transportation (Medical): Not on file  .  Lack of Transportation (Non-Medical): Not on file  Physical Activity:   . Days of Exercise per Week: Not on file  . Minutes of Exercise per Session: Not on file  Stress:   . Feeling of Stress : Not on file  Social Connections:   . Frequency of Communication with Friends and Family: Not on file  . Frequency of Social Gatherings with Friends and Family: Not on file  . Attends Religious Services: Not on file  . Active Member of Clubs or Organizations: Not on file  . Attends Banker Meetings: Not on file  . Marital Status: Not on file  Intimate Partner Violence:   . Fear of Current or Ex-Partner: Not on file  . Emotionally Abused: Not on file  . Physically Abused: Not on file  . Sexually Abused: Not on file    Physical Exam      Future Appointments  Date Time Provider Department Center  01/11/2020  1:25 PM [facility] None  01/28/2020  1:45 PM Felecia Shelling, DPM [facility]  03/21/2020  7:35 AM [facility] DEVICE REMOTES [facility]  03/27/2020 11:00 AM MC ECHO OP 1 [facility]  03/27/2020 12:00 PM Laurey Morale, MD [facility] None  06/20/2020  7:35 AM [facility] DEVICE REMOTES [facility]  09/19/2020  7:35 AM [facility] DEVICE REMOTES [facility]    BP (!) 98/56    Pulse (!) 106   Resp 16   Wt 199 lb (90.3 kg)   SpO2 95%   BMI 31.17 kg/m  B/p recheck-106/65 Weight yesterday-199 Last visit weight-199  Pt called me today asking for me to come by as he is not feeling well.  He states he just feels "blah" non-specific. He denies increased sob, edema same, no c/p, no palpitations, no dizziness.  He has felt "blah" for 3-4 days.  Applied EKG and it showed A-fib w/ PVC's rate jumping between 90-115 . B/p as noted. I called clinic and spoke to heather--see her note regarding sch for his cardioversion and COVID test. This was relayed and written down for him and his wife.  I told him should he begin to feel worse-he needs to call 911 ASAP. He has missed numerous doses of his meds the past few wks. I told him to be sure to take all his meds--minus the directions for procedure.  Will f/u next week.   [name], EMT-Paramedic [phone] [facility] Paramedic  01/10/20

## 2020-01-10 NOTE — Telephone Encounter (Signed)
Katie called to let us know that pt called her earlier today and ask if she could come out and see him b/c he doesn't feel good.  Florentina Addison states she is now out to see him and he is in a-fib w/HR 100-115 and SBP 106, she states pt is more fatigued and reports feeling "crummy"  She states pt is on Eliquis but occasionally misses doses.  Discussed all above w/Dr Shirlee Latch, he advises pt needs TEE/DCCV Mon or Tue as soon as we can get it arranged.  TEE/DCCV sch for Tue 2/16 at 11 AM.  Florentina Addison is aware and is relaying all info to pt and family they are agreeable and verbalizes understanding of the following instructions:  NPO after midnight Mon night Take meds with water Tue AM, except Glipizide and Torsemide Make sure to take [facility] and do not miss any doses COVID test 2/12 at 1:25 PM at [facility], must self quarantine afterwards Arrive to [employer] of hospital on Tue 2/16 at 9:30 AM

## 2020-01-11 ENCOUNTER — Other Ambulatory Visit (HOSPITAL_COMMUNITY)
Admission: RE | Admit: 2020-01-11 | Discharge: 2020-01-11 | Disposition: A | Discharge status: Home / Self Care | Payer: No Typology Code available for payment source | Source: Ambulatory Visit | Attending: Cardiology | Admitting: Cardiology

## 2020-01-11 DIAGNOSIS — Z01812 Encounter for preprocedural laboratory examination: Secondary | ICD-10-CM | POA: Diagnosis present

## 2020-01-11 DIAGNOSIS — Z20822 Contact with and (suspected) exposure to covid-19: Secondary | ICD-10-CM | POA: Insufficient documentation

## 2020-01-11 LAB — SARS CORONAVIRUS 2 (TAT 6-24 HRS): SARS Coronavirus 2: NEGATIVE

## 2020-01-15 ENCOUNTER — Ambulatory Visit (HOSPITAL_COMMUNITY)
Admission: RE | Admit: 2020-01-15 | Discharge: 2020-01-15 | Disposition: A | Discharge status: Home / Self Care | Payer: No Typology Code available for payment source | Attending: Cardiology | Admitting: Cardiology

## 2020-01-15 ENCOUNTER — Ambulatory Visit (HOSPITAL_COMMUNITY): Payer: No Typology Code available for payment source | Admitting: Certified Registered Nurse Anesthetist

## 2020-01-15 ENCOUNTER — Other Ambulatory Visit: Payer: Self-pay

## 2020-01-15 ENCOUNTER — Encounter (HOSPITAL_COMMUNITY)
Admission: RE | Disposition: A | Discharge status: Home / Self Care | Payer: Self-pay | Source: Home / Self Care | Attending: Cardiology

## 2020-01-15 ENCOUNTER — Ambulatory Visit (HOSPITAL_BASED_OUTPATIENT_CLINIC_OR_DEPARTMENT_OTHER): Payer: No Typology Code available for payment source

## 2020-01-15 DIAGNOSIS — I340 Nonrheumatic mitral (valve) insufficiency: Secondary | ICD-10-CM

## 2020-01-15 DIAGNOSIS — I428 Other cardiomyopathies: Secondary | ICD-10-CM | POA: Insufficient documentation

## 2020-01-15 DIAGNOSIS — I5022 Chronic systolic (congestive) heart failure: Secondary | ICD-10-CM | POA: Diagnosis not present

## 2020-01-15 DIAGNOSIS — Z791 Long term (current) use of non-steroidal anti-inflammatories (NSAID): Secondary | ICD-10-CM | POA: Diagnosis not present

## 2020-01-15 DIAGNOSIS — Z95810 Presence of automatic (implantable) cardiac defibrillator: Secondary | ICD-10-CM | POA: Insufficient documentation

## 2020-01-15 DIAGNOSIS — Z9981 Dependence on supplemental oxygen: Secondary | ICD-10-CM | POA: Insufficient documentation

## 2020-01-15 DIAGNOSIS — E785 Hyperlipidemia, unspecified: Secondary | ICD-10-CM | POA: Insufficient documentation

## 2020-01-15 DIAGNOSIS — M109 Gout, unspecified: Secondary | ICD-10-CM | POA: Diagnosis not present

## 2020-01-15 DIAGNOSIS — I130 Hypertensive heart and chronic kidney disease with heart failure and stage 1 through stage 4 chronic kidney disease, or unspecified chronic kidney disease: Secondary | ICD-10-CM | POA: Insufficient documentation

## 2020-01-15 DIAGNOSIS — E039 Hypothyroidism, unspecified: Secondary | ICD-10-CM | POA: Insufficient documentation

## 2020-01-15 DIAGNOSIS — I2510 Atherosclerotic heart disease of native coronary artery without angina pectoris: Secondary | ICD-10-CM | POA: Diagnosis not present

## 2020-01-15 DIAGNOSIS — I4892 Unspecified atrial flutter: Secondary | ICD-10-CM | POA: Insufficient documentation

## 2020-01-15 DIAGNOSIS — N183 Chronic kidney disease, stage 3 unspecified: Secondary | ICD-10-CM | POA: Diagnosis not present

## 2020-01-15 DIAGNOSIS — Z7984 Long term (current) use of oral hypoglycemic drugs: Secondary | ICD-10-CM | POA: Insufficient documentation

## 2020-01-15 DIAGNOSIS — Z87891 Personal history of nicotine dependence: Secondary | ICD-10-CM | POA: Diagnosis not present

## 2020-01-15 DIAGNOSIS — E1122 Type 2 diabetes mellitus with diabetic chronic kidney disease: Secondary | ICD-10-CM | POA: Diagnosis not present

## 2020-01-15 DIAGNOSIS — Z7901 Long term (current) use of anticoagulants: Secondary | ICD-10-CM | POA: Diagnosis not present

## 2020-01-15 DIAGNOSIS — Z79899 Other long term (current) drug therapy: Secondary | ICD-10-CM | POA: Diagnosis not present

## 2020-01-15 DIAGNOSIS — J9611 Chronic respiratory failure with hypoxia: Secondary | ICD-10-CM | POA: Insufficient documentation

## 2020-01-15 DIAGNOSIS — Z79890 Hormone replacement therapy (postmenopausal): Secondary | ICD-10-CM | POA: Insufficient documentation

## 2020-01-15 DIAGNOSIS — I480 Paroxysmal atrial fibrillation: Secondary | ICD-10-CM | POA: Diagnosis not present

## 2020-01-15 DIAGNOSIS — Z8249 Family history of ischemic heart disease and other diseases of the circulatory system: Secondary | ICD-10-CM | POA: Diagnosis not present

## 2020-01-15 DIAGNOSIS — R319 Hematuria, unspecified: Secondary | ICD-10-CM | POA: Insufficient documentation

## 2020-01-15 DIAGNOSIS — Z888 Allergy status to other drugs, medicaments and biological substances status: Secondary | ICD-10-CM | POA: Diagnosis not present

## 2020-01-15 DIAGNOSIS — Z955 Presence of coronary angioplasty implant and graft: Secondary | ICD-10-CM | POA: Insufficient documentation

## 2020-01-15 HISTORY — PX: CARDIOVERSION: SHX1299

## 2020-01-15 HISTORY — PX: TEE WITHOUT CARDIOVERSION: SHX5443

## 2020-01-15 LAB — POCT I-STAT, CHEM 8
BUN: 45 mg/dL — ABNORMAL HIGH (ref 8–23)
Calcium, Ion: 1.13 mmol/L — ABNORMAL LOW (ref 1.15–1.40)
Chloride: 107 mmol/L (ref 98–111)
Creatinine, Ser: 1.4 mg/dL — ABNORMAL HIGH (ref 0.61–1.24)
Glucose, Bld: 90 mg/dL (ref 70–99)
HCT: 29 % — ABNORMAL LOW (ref 39.0–52.0)
Hemoglobin: 9.9 g/dL — ABNORMAL LOW (ref 13.0–17.0)
Potassium: 4 mmol/L (ref 3.5–5.1)
Sodium: 142 mmol/L (ref 135–145)
TCO2: 24 mmol/L (ref 22–32)

## 2020-01-15 SURGERY — ECHOCARDIOGRAM, TRANSESOPHAGEAL
Anesthesia: Monitor Anesthesia Care

## 2020-01-15 MED ORDER — PROPOFOL 10 MG/ML IV BOLUS
INTRAVENOUS | Status: DC | PRN
  Administered 2020-01-15 (×2): 10 mg via INTRAVENOUS
  Administered 2020-01-15: 20 mg via INTRAVENOUS
  Administered 2020-01-15 (×3): 10 mg via INTRAVENOUS

## 2020-01-15 MED ORDER — PHENYLEPHRINE 40 MCG/ML (10ML) SYRINGE FOR IV PUSH (FOR BLOOD PRESSURE SUPPORT)
PREFILLED_SYRINGE | INTRAVENOUS | Status: DC | PRN
  Administered 2020-01-15 (×3): 80 ug via INTRAVENOUS

## 2020-01-15 MED ORDER — LIDOCAINE 2% (20 MG/ML) 5 ML SYRINGE
INTRAMUSCULAR | Status: DC | PRN
  Administered 2020-01-15: 100 mg via INTRAVENOUS

## 2020-01-15 MED ORDER — PROPOFOL 500 MG/50ML IV EMUL
INTRAVENOUS | Status: DC | PRN
  Administered 2020-01-15: 50 ug/kg/min via INTRAVENOUS

## 2020-01-15 MED ORDER — SODIUM CHLORIDE 0.9 % IV SOLN: INTRAVENOUS | Status: DC

## 2020-01-15 NOTE — Discharge Instructions (Signed)
Electrical Cardioversion   What can I expect after the procedure?  Your blood pressure, heart rate, breathing rate, and blood oxygen level will be monitored until you leave the hospital or clinic.  Your heart rhythm will be watched to make sure it does not change.  You may have some redness on the skin where the shocks were given. Follow these instructions at home:  Do not drive for 24 hours if you were given a sedative during your procedure.  Take over-the-counter and prescription medicines only as told by your health care provider.  Ask your health care provider how to check your pulse. Check it often.  Rest for 48 hours after the procedure or as told by your health care provider.  Avoid or limit your caffeine use as told by your health care provider.  Keep all follow-up visits as told by your health care provider. This is important. Contact a health care provider if:  You feel like your heart is beating too quickly or your pulse is not regular.  You have a serious muscle cramp that does not go away. Get help right away if:  You have discomfort in your chest.  You are dizzy or you feel faint.  You have trouble breathing or you are short of breath.  Your speech is slurred.  You have trouble moving an arm or leg on one side of your body.  Your fingers or toes turn cold or blue. Summary  Electrical cardioversion is the delivery of a jolt of electricity to restore a normal rhythm to the heart.  This procedure may be done right away in an emergency or may be a scheduled procedure if the condition is not an emergency.  Generally, this is a safe procedure.  After the procedure, check your pulse often as told by your health care provider. This information is not intended to replace advice given to you by your health care provider. Make sure you discuss any questions you have with your health care provider. Document Revised: 06/18/2019 Document Reviewed:  06/18/2019 Elsevier Patient Education  2020 Elsevier Inc.  

## 2020-01-15 NOTE — CV Procedure (Signed)
Procedure: TEE  Indication: atrial flutter/atrial tachycardia  Sedation: Per anesthesiology  Findings: Please see echo section for full report.  No LA appendage thrombus, proceed with DCCV.   [name] 01/15/2020 11:36 AM

## 2020-01-15 NOTE — Procedures (Signed)
Electrical Cardioversion Procedure Note Ryan Macdonald [id number] 1934-11-15  Procedure: Electrical Cardioversion Indications:  Atrial flutter/atrial tachycardia  Procedure Details Consent: Risks of procedure as well as the alternatives and risks of each were explained to the (patient/caregiver).  Consent for procedure obtained. Time Out: Verified patient identification, verified procedure, site/side was marked, verified correct patient position, special equipment/implants available, medications/allergies/relevent history reviewed, required imaging and test results available.  Performed  Patient placed on cardiac monitor, pulse oximetry, supplemental oxygen as necessary.  Sedation given: Propofol per anesthesiology Pacer pads placed anterior and posterior chest.  Cardioverted 4 time(s).  Cardioverted at 200J, the 3rd and 4th shocks with sternal pressure.   Evaluation Findings: Post procedure EKG shows: Atrial Flutter Complications: None Patient did tolerate procedure well.   Ryan Macdonald 01/15/2020, 11:36 AM

## 2020-01-15 NOTE — Interval H&P Note (Signed)
History and Physical Interval Note:  01/15/2020 11:18 AM  Ryan Macdonald  has presented today for surgery, with the diagnosis of afib.  The various methods of treatment have been discussed with the patient and family. After consideration of risks, benefits and other options for treatment, the patient has consented to  Procedure(s): TRANSESOPHAGEAL ECHOCARDIOGRAM (TEE) (N/A) CARDIOVERSION (N/A) as a surgical intervention.  The patient's history has been reviewed, patient examined, no change in status, stable for surgery.  I have reviewed the patient's chart and labs.  Questions were answered to the patient's satisfaction.     Talor Cheema [employer]

## 2020-01-15 NOTE — Transfer of Care (Signed)
Immediate Anesthesia Transfer of Care Note  Patient: Ryan Macdonald  Procedure(s) Performed: TRANSESOPHAGEAL ECHOCARDIOGRAM (TEE) (N/A ) CARDIOVERSION (N/A )  Patient Location: Endoscopy Unit  Anesthesia Type:MAC  Level of Consciousness: awake, alert  and oriented  Airway & Oxygen Therapy: Patient Spontanous Breathing and Patient connected to nasal cannula oxygen  Post-op Assessment: Report given to RN and Post -op Vital signs reviewed and stable  Post vital signs: Reviewed and stable  Last Vitals:  Vitals Value Taken Time  BP    Temp    Pulse 107 01/15/20 1144  Resp 29 01/15/20 1144  SpO2 97 % 01/15/20 1144  Vitals shown include unvalidated device data.  Last Pain:  Vitals:   01/15/20 1022  TempSrc: Oral  PainSc: 0-No pain         Complications: No apparent anesthesia complications

## 2020-01-15 NOTE — Progress Notes (Signed)
  Echocardiogram Echocardiogram Transesophageal has been performed.  Gerda Diss 01/15/2020, 12:05 PM

## 2020-01-15 NOTE — Anesthesia Procedure Notes (Signed)
Procedure Name: MAC Date/Time: 01/15/2020 11:04 AM Performed by: Candis Shine, CRNA Pre-anesthesia Checklist: Patient identified, Emergency Drugs available, Suction available, Patient being monitored and Timeout performed Patient Re-evaluated:Patient Re-evaluated prior to induction Oxygen Delivery Method: Nasal cannula Dental Injury: Teeth and Oropharynx as per pre-operative assessment

## 2020-01-15 NOTE — Anesthesia Postprocedure Evaluation (Signed)
Anesthesia Post Note  Patient: Ryan Macdonald  Procedure(s) Performed: TRANSESOPHAGEAL ECHOCARDIOGRAM (TEE) (N/A ) CARDIOVERSION (N/A )     Patient location during evaluation: Endoscopy Anesthesia Type: MAC Level of consciousness: awake and alert Pain management: pain level controlled Vital Signs Assessment: post-procedure vital signs reviewed and stable Respiratory status: spontaneous breathing, nonlabored ventilation and respiratory function stable Cardiovascular status: stable and blood pressure returned to baseline Postop Assessment: no apparent nausea or vomiting Anesthetic complications: no    Last Vitals:  Vitals:   01/15/20 1155 01/15/20 1210  BP: 114/65 113/70  Pulse: (!) 102 100  Resp: 20   Temp:    SpO2: 99% 96%    Last Pain:  Vitals:   01/15/20 1210  TempSrc:   PainSc: 0-No pain                 Lynda Rainwater

## 2020-01-15 NOTE — Anesthesia Preprocedure Evaluation (Signed)
Anesthesia Evaluation  Patient identified by MRN, date of birth, ID band Patient awake    Reviewed: Allergy & Precautions, H&P , NPO status , Patient's Chart, lab work & pertinent test results, reviewed documented beta blocker date and time   Airway Mallampati: I  TM Distance: >3 FB Neck ROM: Full    Dental  (+) Upper Dentures, Lower Dentures, Edentulous Upper, Edentulous Lower   Pulmonary COPD, former smoker,    Pulmonary exam normal        Cardiovascular hypertension, Pt. on medications and Pt. on home beta blockers +CHF  Normal cardiovascular exam+ dysrhythmias Atrial Fibrillation   Echo on 11/01/11 showed LV function is severely reduced, EF 25%. RV systolic function is mildly reduced.  Trace AR. Mild to moderate MR. Trace PR. Mild TR.     Neuro/Psych Depression    GI/Hepatic negative GI ROS,   Endo/Other  negative endocrine ROSdiabetes  Renal/GU negative Renal ROS     Musculoskeletal  (+) Arthritis , Osteoarthritis,    Abdominal   Peds  Hematology   Anesthesia Other Findings   Reproductive/Obstetrics                             Anesthesia Physical  Anesthesia Plan  ASA: III  Anesthesia Plan: MAC   Post-op Pain Management:    Induction: Intravenous  PONV Risk Score and Plan: 1 and Ondansetron and Treatment may vary due to age or medical condition  Airway Management Planned: Nasal Cannula  Additional Equipment:   Intra-op Plan:   Post-operative Plan:   Informed Consent: I have reviewed the patients History and Physical, chart, labs and discussed the procedure including the risks, benefits and alternatives for the proposed anesthesia with the patient or authorized representative who has indicated his/her understanding and acceptance.     Dental advisory given  Plan Discussed with: CRNA, Anesthesiologist and Surgeon  Anesthesia Plan Comments:         Anesthesia  Quick Evaluation

## 2020-01-16 ENCOUNTER — Ambulatory Visit (HOSPITAL_COMMUNITY)
Admit: 2020-01-16 | Discharge: 2020-01-16 | Disposition: A | Discharge status: Home / Self Care | Payer: No Typology Code available for payment source | Source: Ambulatory Visit | Attending: Physician Assistant | Admitting: Physician Assistant

## 2020-01-16 ENCOUNTER — Encounter (HOSPITAL_COMMUNITY): Payer: Self-pay | Admitting: Physician Assistant

## 2020-01-16 VITALS — BP 128/70 | HR 118 | Ht 67.0 in | Wt 199.0 lb

## 2020-01-16 DIAGNOSIS — N189 Chronic kidney disease, unspecified: Secondary | ICD-10-CM | POA: Insufficient documentation

## 2020-01-16 DIAGNOSIS — Z87891 Personal history of nicotine dependence: Secondary | ICD-10-CM | POA: Diagnosis not present

## 2020-01-16 DIAGNOSIS — Z7901 Long term (current) use of anticoagulants: Secondary | ICD-10-CM | POA: Insufficient documentation

## 2020-01-16 DIAGNOSIS — I130 Hypertensive heart and chronic kidney disease with heart failure and stage 1 through stage 4 chronic kidney disease, or unspecified chronic kidney disease: Secondary | ICD-10-CM | POA: Diagnosis not present

## 2020-01-16 DIAGNOSIS — Z6831 Body mass index (BMI) 31.0-31.9, adult: Secondary | ICD-10-CM | POA: Insufficient documentation

## 2020-01-16 DIAGNOSIS — D6869 Other thrombophilia: Secondary | ICD-10-CM | POA: Diagnosis not present

## 2020-01-16 DIAGNOSIS — M1990 Unspecified osteoarthritis, unspecified site: Secondary | ICD-10-CM | POA: Diagnosis not present

## 2020-01-16 DIAGNOSIS — I5022 Chronic systolic (congestive) heart failure: Secondary | ICD-10-CM | POA: Diagnosis not present

## 2020-01-16 DIAGNOSIS — E7800 Pure hypercholesterolemia, unspecified: Secondary | ICD-10-CM | POA: Diagnosis not present

## 2020-01-16 DIAGNOSIS — I4892 Unspecified atrial flutter: Secondary | ICD-10-CM | POA: Diagnosis not present

## 2020-01-16 DIAGNOSIS — I2510 Atherosclerotic heart disease of native coronary artery without angina pectoris: Secondary | ICD-10-CM | POA: Insufficient documentation

## 2020-01-16 DIAGNOSIS — Z79899 Other long term (current) drug therapy: Secondary | ICD-10-CM | POA: Insufficient documentation

## 2020-01-16 DIAGNOSIS — I480 Paroxysmal atrial fibrillation: Secondary | ICD-10-CM | POA: Diagnosis not present

## 2020-01-16 DIAGNOSIS — E119 Type 2 diabetes mellitus without complications: Secondary | ICD-10-CM | POA: Diagnosis not present

## 2020-01-16 DIAGNOSIS — M109 Gout, unspecified: Secondary | ICD-10-CM | POA: Diagnosis not present

## 2020-01-16 NOTE — Progress Notes (Signed)
Primary Care Physician: [facility] Primary Cardiologist: Dr Shirlee Latch Primary Electrophysiologist: Dr Graciela Husbands Referring Physician: Dr Shirlee Latch   Ryan Macdonald is a 86 y.o. male with a history of systolic HF due toNICM, Biotronic ICD, persistent A Fib, DMII, HTN,CAD s/p pLAD stent in 07/2018 with residual non-obstructive disease,chronic hypoxic respiratory failure on home O2 who presents for follow up in the [facility]. Patient is on Eliquis for a CHADS2VASC score of 6. Patient was noted to be in afib per [name] on a home visit on 01/10/20. Patient underwent TEE/DCCV on 01/15/20 which was unsuccessful after 4 attempts. Patient reports that today he feels well and that he is at his baseline. He denies SOB, heart racing, palpitations, or CP. His energy level is at baseline.   Today, he denies symptoms of palpitations, chest pain, shortness of breath, orthopnea, PND, lower extremity edema, dizziness, presyncope, syncope, snoring, daytime somnolence, bleeding, or neurologic sequela. The patient is tolerating medications without difficulties and is otherwise without complaint today.    Atrial Fibrillation Risk Factors:  he does not have symptoms or diagnosis of sleep apnea. he does not have a history of rheumatic fever.   he has a BMI of Body mass index is 31.17 kg/m.Marland Kitchen Filed Weights   01/16/20 0933  Weight: 90.3 kg    Family History  Problem Relation Age of Onset  . Heart disease Mother   . Heart Problems Maternal Grandmother   . Other Other        negative for premature CAD     Atrial Fibrillation Management history:  Previous antiarrhythmic drugs: amiodarone Previous cardioversions: 01/15/20 Previous ablations: none CHADS2VASC score: 6 Anticoagulation history: Eliquis   Past Medical History:  Diagnosis Date  . AKI (acute kidney injury) (HCC) 02/2019  . Atrial fibrillation (HCC)   . Cardiomyopathy (HCC)   . Cat scratch fever    removed mass on right side neck  . Cataracts, bilateral   . CHF (congestive heart failure) (HCC)   . Chronic kidney disease   . Corns and callosities   . Diabetes mellitus without complication (HCC)    TYPE 2  . Dyspnea   . Erythema intertrigo   . Flat foot   . Gout   . High cholesterol   . Hx of urethral stricture   . Hypertension   . Kidney stones   . Morbid obesity (HCC)   . Nonischemic cardiomyopathy (HCC)    a. ? 2009 Cath in [place] - nl cors per pt;  b. 09/2013 Echo: EF 25-30%, sev diff HK.  . Obesity   . Osteoarthritis   . PAF (paroxysmal atrial fibrillation) (HCC)    a. post-op hip in 2014 - prev on xarelto.  . Renal insufficiency    Past Surgical History:  Procedure Laterality Date  . CARDIAC CATHETERIZATION  1980's  . CIRCUMCISION  1940's  . CORONARY STENT INTERVENTION N/A 08/18/2018   Procedure: CORONARY STENT INTERVENTION;  Surgeon: Corky Crafts, MD;  Location: [facility] INVASIVE CV LAB;  Service: Cardiovascular;  Laterality: N/A;  . CYSTOSCOPY WITH URETHRAL DILATATION  10/23/2013  . CYSTOSCOPY WITH URETHRAL DILATATION N/A 10/23/2013   Procedure: CYSTOSCOPY WITH URETHRAL DILATATION;  Surgeon: Kathi Ludwig, MD;  Location: [facility] OR;  Service: Urology;  Laterality: N/A;  . INCISION AND DRAINAGE OF WOUND Right 1980's   "cat scratch" (10/23/2013)  . INGUINAL HERNIA REPAIR Right 1950's  . INTRAVASCULAR ULTRASOUND/IVUS N/A 08/18/2018   Procedure: Intravascular Ultrasound/IVUS;  Surgeon: Eldridge Dace,  Donnie Coffin, MD;  Location: [facility] INVASIVE CV LAB;  Service: Cardiovascular;  Laterality: N/A;  . KIDNEY STONE SURGERY Right 1970's; 1983   "twice"  . LEFT HEART CATH AND CORONARY ANGIOGRAPHY N/A 08/18/2018   Procedure: LEFT HEART CATH AND CORONARY ANGIOGRAPHY;  Surgeon: Corky Crafts, MD;  Location: [facility] INVASIVE CV LAB;  Service: Cardiovascular;  Laterality: N/A;  . TONSILLECTOMY AND ADENOIDECTOMY  1940's  . TOTAL HIP ARTHROPLASTY Left 10/23/2013   Procedure: TOTAL HIP  ARTHROPLASTY;  Surgeon: Valeria Batman, MD;  Location: [facility] OR;  Service: Orthopedics;  Laterality: Left;    Current Outpatient Medications  Medication Sig Dispense Refill  . allopurinol (ZYLOPRIM) 100 MG tablet Take 200 mg by mouth daily.     Marland Kitchen apixaban (ELIQUIS) 2.5 MG TABS tablet Take 1 tablet (2.5 mg total) by mouth 2 (two) times daily. 90 tablet 0  . cyanocobalamin 1000 MCG tablet Take 1 tablet (1,000 mcg total) by mouth daily. 90 tablet 0  . diclofenac sodium (VOLTAREN) 1 % GEL Apply 4 g topically 4 (four) times daily. 1 Tube 0  . docusate sodium (COLACE) 100 MG capsule Take 100 mg by mouth 2 (two) times daily.    . finasteride (PROSCAR) 5 MG tablet Take 1 tablet (5 mg total) by mouth daily. 30 tablet 0  . glipiZIDE (GLUCOTROL) 5 MG tablet Take 5 mg by mouth daily before breakfast.    . hydrALAZINE (APRESOLINE) 25 MG tablet Take 75 mg by mouth 3 (three) times daily.    . hydrALAZINE (APRESOLINE) 50 MG tablet Take 1.5 tablets (75 mg total) by mouth 3 (three) times daily. 405 tablet 3  . HYDROcodone-acetaminophen (NORCO/VICODIN) 5-325 MG tablet Take 2 tablets by mouth every 4 (four) hours as needed. (Patient taking differently: Take 2 tablets by mouth every 4 (four) hours as needed (for pain.). ) 6 tablet 0  . ipratropium-albuterol (DUONEB) 0.5-2.5 (3) MG/3ML SOLN Take 3 mLs by nebulization every 4 (four) hours as needed. (Patient taking differently: Take 3 mLs by nebulization every 4 (four) hours as needed (wheezing/shortness of breath). ) 360 mL 0  . isosorbide mononitrate (IMDUR) 30 MG 24 hr tablet Take 90 mg by mouth daily.    . isosorbide mononitrate (IMDUR) 60 MG 24 hr tablet Take 1.5 tablets (90 mg total) by mouth daily. 135 tablet 3  . ketoconazole (NIZORAL) 2 % shampoo Apply 1 application topically 2 (two) times a week.    . levothyroxine (SYNTHROID) 125 MCG tablet Take 1 tablet (125 mcg total) by mouth daily before breakfast. 90 tablet 1  . magnesium oxide (MAG-OX) 400 MG tablet  Take 400 mg by mouth daily.    . methocarbamol (ROBAXIN) 500 MG tablet Take 1 tablet (500 mg total) by mouth every 6 (six) hours as needed for muscle spasms. 30 tablet 0  . nitroGLYCERIN (NITROSTAT) 0.4 MG SL tablet Place 1 tablet (0.4 mg total) under the tongue every 5 (five) minutes x 3 doses as needed for chest pain. 25 tablet 0  . rosuvastatin (CRESTOR) 10 MG tablet Take 1 tablet (10 mg total) by mouth daily at 6 PM. 30 tablet 5  . sertraline (ZOLOFT) 25 MG tablet Take 25 mg by mouth daily.     Marland Kitchen spironolactone (ALDACTONE) 25 MG tablet Take 1 tablet (25 mg total) by mouth daily. 30 tablet 6  . tamsulosin (FLOMAX) 0.4 MG CAPS capsule Take 0.4 mg by mouth at bedtime.    . torsemide (DEMADEX) 20 MG tablet Take 2 tablets (40  mg total) by mouth daily.     No current facility-administered medications for this encounter.    Allergies  Allergen Reactions  . Lisinopril Swelling    Facial swelling  . Xarelto [Rivaroxaban] Other (See Comments)    "Blood came out of my penis"    Social History   Socioeconomic History  . Marital status: Married    Spouse name: Not on file  . Number of children: Not on file  . Years of education: Not on file  . Highest education level: Not on file  Occupational History  . Occupation: Retired    Comment: Former Public house manager  Tobacco Use  . Smoking status: Former Smoker    Packs/day: 1.00    Years: 15.00    Pack years: 15.00    Types: Cigarettes, Cigars  . Smokeless tobacco: Former Neurosurgeon  . Tobacco comment: quit smoking ~ 50 yr ago  Substance and Sexual Activity  . Alcohol use: No    Alcohol/week: 2.0 standard drinks    Types: 2 Cans of beer per week    Comment: rare beer.  . Drug use: No  . Sexual activity: Yes  Other Topics Concern  . Not on file  Social History Narrative   Lives in [place] with wife. Moved from [place] (2013). Does not routinely exercise.   Social Determinants of Health   Financial Resource Strain:   . Difficulty of Paying Living  Expenses: Not on file  Food Insecurity:   . Worried About Programme researcher, broadcasting/film/video in the Last Year: Not on file  . Ran Out of Food in the Last Year: Not on file  Transportation Needs:   . Lack of Transportation (Medical): Not on file  . Lack of Transportation (Non-Medical): Not on file  Physical Activity:   . Days of Exercise per Week: Not on file  . Minutes of Exercise per Session: Not on file  Stress:   . Feeling of Stress : Not on file  Social Connections:   . Frequency of Communication with Friends and Family: Not on file  . Frequency of Social Gatherings with Friends and Family: Not on file  . Attends Religious Services: Not on file  . Active Member of Clubs or Organizations: Not on file  . Attends Banker Meetings: Not on file  . Marital Status: Not on file  Intimate Partner Violence:   . Fear of Current or Ex-Partner: Not on file  . Emotionally Abused: Not on file  . Physically Abused: Not on file  . Sexually Abused: Not on file     ROS- All systems are reviewed and negative except as per the HPI above.  Physical Exam: Vitals:   01/16/20 0933  BP: 128/70  Pulse: (!) 118  Weight: 90.3 kg  Height: 5\' 7"  (1.702 m)    GEN- The patient is well appearing obese elderly male, alert and oriented x 3 today.   Head- normocephalic, atraumatic Eyes-  Sclera clear, conjunctiva pink Ears- hearing intact Oropharynx- clear Neck- supple  Lungs- Clear to ausculation bilaterally, normal work of breathing Heart- Regular rate and rhythm, tachycardia, no murmurs, rubs or gallops  GI- soft, NT, ND, + BS Extremities- no clubbing, cyanosis, or edema MS- no significant deformity or atrophy Skin- no rash or lesion Psych- euthymic mood, full affect Neuro- strength and sensation are intact  Wt Readings from Last 3 Encounters:  01/16/20 90.3 kg  01/10/20 90.3 kg  01/03/20 90.3 kg    EKG today demonstrates sinus  tach vs atrial tach HR 118, PVC, 1st degree AV block PR 232,  QRS 96, QTc 431  Echo 11/24/18 demonstrated  Left ventricle: The cavity size was normal. Wall thickness was  normal. Systolic function was severely reduced. The estimated  ejection fraction was in the range of 20% to 25%. Diffuse  hypokinesis. Doppler parameters are consistent with a reversible  restrictive pattern, indicative of decreased left ventricular  diastolic compliance and/or increased left atrial pressure (grade  3 diastolic dysfunction).  - Aortic valve: Valve mobility was mildly restricted. There was  trivial regurgitation.  - Mitral valve: There was moderate regurgitation.  - Left atrium: The atrium was mildly to moderately dilated.  - Right ventricle: The cavity size was mildly dilated. Wall  thickness was normal. Systolic function was moderately reduced.  - Right atrium: The atrium was mildly dilated. Central venous  pressure (est): 10 mm Hg.  - Tricuspid valve: There was trivial regurgitation.  - Pulmonary arteries: Systolic pressure was mildly increased. PA  peak pressure: 39 mm Hg (S).  - Inferior vena cava: The vessel was normal in size. The  respirophasic diameter changes were blunted (< 50%).  - Pericardium, extracardiac: There was no pericardial effusion.   Impressions:   - Compared to the prior exam, mitral regurgitation may have  increased slightly. Otherwise,no significant change has occured.  Side by side comparison of images performed.   Epic records are reviewed at length today  CHA2DS2-VASc Score = 6 The patient's score is based upon: CHF History: Yes HTN History: Yes Age : 49 + Diabetes History: Yes Stroke History: No Vascular Disease History: Yes Gender: Male      ASSESSMENT AND PLAN: 1. Paroxysmal Atrial Fibrillation/atrial flutter/atrial tach The patient's CHA2DS2-VASc score is 6, indicating a 9.7% annual risk of stroke.   S/p unsuccessful DCCV on 01/16/20, but patient is in sinus tach today, confirmed by device  interrogation. No afib detected by device. Patient asymptomatic.  Failed amiodarone previously with significant hypothyroidism. Would not use class 1C or Multaq with CHF. Has not tolerated BB in the past with 1st degree AV block.  Continue Eliquis 2.5 mg BID  2. Secondary Hypercoagulable State (ICD10:  D68.69) The patient is at significant risk for stroke/thromboembolism based upon his CHA2DS2-VASc Score of 6.  Continue Apixaban (Eliquis).   3. Obesity Body mass index is 31.17 kg/m. Lifestyle modification was discussed at length including regular exercise and weight reduction.  4. HTN Stable, no changes today.  5. CAD S/p PCI 2019 No anginal symptoms.  6. Chronic systolic CHF No signs or symptoms of fluid overload. Followed by Dr Aundra Dubin.   Follow up with Dr Caryl Comes in one month. Dr Aundra Dubin as scheduled.    [facility] [address] [place], [place] [address] [phone] 01/16/2020 11:07 AM

## 2020-01-18 ENCOUNTER — Encounter: Payer: Self-pay | Admitting: *Deleted

## 2020-01-22 ENCOUNTER — Other Ambulatory Visit (HOSPITAL_COMMUNITY): Payer: Self-pay

## 2020-01-22 NOTE — Progress Notes (Signed)
Paramedicine Encounter    Patient ID: Ryan Macdonald, male    DOB: [birth year], 86 y.o.   MRN: [id number]   Patient Care Team: [facility] as PCP - General (General Practice) [name], MD as PCP - Cardiology (Cardiology) Laurey Morale, MD as PCP - Advanced Heart Failure (Cardiology)  Patient Active Problem List   Diagnosis Date Noted  . Secondary hypercoagulable state (HCC) 01/16/2020  . AKI (acute kidney injury) (HCC) 03/14/2019  . Spasm of muscle of lower back 03/14/2019  . Acute renal failure (ARF) (HCC) 03/09/2019  . Transaminitis   . Acute on chronic renal failure (HCC) 03/08/2019  . Acute exacerbation of CHF (congestive heart failure) (HCC) 11/23/2018  . Acute on chronic heart failure (HCC) 10/19/2018  . Pulmonary emphysema (HCC)   . Chronic respiratory failure with hypoxia (HCC)   . Morbid obesity (HCC) 09/09/2018  . URI (upper respiratory infection) 08/31/2018  . UTI (urinary tract infection) 08/31/2018  . Chronic systolic (congestive) heart failure (HCC) 08/28/2018  . CHF (congestive heart failure) (HCC) 08/14/2018  . Acute on chronic combined systolic and diastolic CHF (congestive heart failure) (HCC) 07/18/2018  . First degree AV block   . Shortness of breath 04/29/2018  . Acute HFrEF (heart failure with reduced ejection fraction) (HCC) 04/29/2018  . Acute respiratory failure with hypoxia (HCC) 09/27/2017  . CHF exacerbation (HCC) 09/27/2017  . Renal insufficiency   . Acute on chronic systolic CHF (congestive heart failure) (HCC) 07/08/2017  . Left knee pain 07/08/2017  . Acute bronchitis 05/10/2017  . Acute congestive heart failure (HCC) 04/14/2017  . Elevated troponin 04/14/2017  . Lactic acidosis   . Acute on chronic systolic heart failure, NYHA class 2 (HCC) 02/03/2017  . Diabetes mellitus type 2 in obese (HCC) 02/03/2017  . CAP (community acquired pneumonia) 02/03/2017  . Lobar pneumonia (HCC)   . Diabetes mellitus with complication  (HCC)   . Acute on chronic respiratory failure with hypoxia (HCC)   . Acute on chronic combined systolic and diastolic heart failure (HCC) 05/19/2016  . Depression 05/19/2016  . Gout 05/19/2016  . 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 NSVT (nonsustained ventricular tachycardia) (HCC)   . Chronic renal insufficiency, stage 3 (moderate) (HCC)   . Hyperkalemia 08/21/2015  . Pulmonary embolism (HCC)   . Acute respiratory failure (HCC)   . Osteoarthritis of left hip 10/25/2013  . Atrial fibrillation (HCC) 10/25/2013  . S/P hip replacement 10/24/2013  . Nonischemic cardiomyopathy (HCC)   . Obesity (BMI 30-39.9)   . Essential hypertension   . Hyperlipidemia     Current Outpatient Medications:  .  allopurinol (ZYLOPRIM) 100 MG tablet, Take 200 mg by mouth daily. , Disp: , Rfl:  .  apixaban (ELIQUIS) 2.5 MG TABS tablet, Take 1 tablet (2.5 mg total) by mouth 2 (two) times daily., Disp: 90 tablet, Rfl: 0 .  cyanocobalamin 1000 MCG tablet, Take 1 tablet (1,000 mcg total) by mouth daily., Disp: 90 tablet, Rfl: 0 .  diclofenac sodium (VOLTAREN) 1 % GEL, Apply 4 g topically 4 (four) times daily., Disp: 1 Tube, Rfl: 0 .  docusate sodium (COLACE) 100 MG capsule, Take 100 mg by mouth 2 (two) times daily., Disp: , Rfl:  .  finasteride (PROSCAR) 5 MG tablet, Take 1 tablet (5 mg total) by mouth daily., Disp: 30 tablet, Rfl: 0 .  glipiZIDE (GLUCOTROL) 5 MG tablet, Take 5 mg by mouth daily before breakfast., Disp: , Rfl:  .  hydrALAZINE (APRESOLINE) 25  MG tablet, Take 75 mg by mouth 3 (three) times daily., Disp: , Rfl:  .  HYDROcodone-acetaminophen (NORCO/VICODIN) 5-325 MG tablet, Take 2 tablets by mouth every 4 (four) hours as needed. (Patient taking differently: Take 2 tablets by mouth every 4 (four) hours as needed (for pain.). ), Disp: 6 tablet, Rfl: 0 .  ipratropium-albuterol (DUONEB) 0.5-2.5 (3) MG/3ML SOLN, Take 3 mLs by nebulization every 4 (four) hours as needed.  (Patient taking differently: Take 3 mLs by nebulization every 4 (four) hours as needed (wheezing/shortness of breath). ), Disp: 360 mL, Rfl: 0 .  isosorbide mononitrate (IMDUR) 60 MG 24 hr tablet, Take 1.5 tablets (90 mg total) by mouth daily., Disp: 135 tablet, Rfl: 3 .  ketoconazole (NIZORAL) 2 % shampoo, Apply 1 application topically 2 (two) times a week., Disp: , Rfl:  .  levothyroxine (SYNTHROID) 125 MCG tablet, Take 1 tablet (125 mcg total) by mouth daily before breakfast., Disp: 90 tablet, Rfl: 1 .  magnesium oxide (MAG-OX) 400 MG tablet, Take 400 mg by mouth daily., Disp: , Rfl:  .  methocarbamol (ROBAXIN) 500 MG tablet, Take 1 tablet (500 mg total) by mouth every 6 (six) hours as needed for muscle spasms., Disp: 30 tablet, Rfl: 0 .  rosuvastatin (CRESTOR) 10 MG tablet, Take 1 tablet (10 mg total) by mouth daily at 6 PM., Disp: 30 tablet, Rfl: 5 .  sertraline (ZOLOFT) 25 MG tablet, Take 25 mg by mouth daily. , Disp: , Rfl:  .  spironolactone (ALDACTONE) 25 MG tablet, Take 1 tablet (25 mg total) by mouth daily., Disp: 30 tablet, Rfl: 6 .  tamsulosin (FLOMAX) 0.4 MG CAPS capsule, Take 0.4 mg by mouth at bedtime., Disp: , Rfl:  .  torsemide (DEMADEX) 20 MG tablet, Take 2 tablets (40 mg total) by mouth daily., Disp: , Rfl:  .  hydrALAZINE (APRESOLINE) 50 MG tablet, Take 1.5 tablets (75 mg total) by mouth 3 (three) times daily. (Patient not taking: Reported on 01/22/2020), Disp: 405 tablet, Rfl: 3 .  isosorbide mononitrate (IMDUR) 30 MG 24 hr tablet, Take 90 mg by mouth daily., Disp: , Rfl:  .  nitroGLYCERIN (NITROSTAT) 0.4 MG SL tablet, Place 1 tablet (0.4 mg total) under the tongue every 5 (five) minutes x 3 doses as needed for chest pain. (Patient not taking: Reported on 01/22/2020), Disp: 25 tablet, Rfl: 0 Allergies  Allergen Reactions  . Lisinopril Swelling    Facial swelling  . Xarelto [Rivaroxaban] Other (See Comments)    "Blood came out of my penis"      Social History    Socioeconomic History  . Marital status: Married    Spouse name: Not on file  . Number of children: Not on file  . Years of education: Not on file  . Highest education level: Not on file  Occupational History  . Occupation: Retired    Comment: Former Public house manager  Tobacco Use  . Smoking status: Former Smoker    Packs/day: 1.00    Years: 15.00    Pack years: 15.00    Types: Cigarettes, Cigars  . Smokeless tobacco: Former Neurosurgeon  . Tobacco comment: quit smoking ~ 50 yr ago  Substance and Sexual Activity  . Alcohol use: No    Alcohol/week: 2.0 standard drinks    Types: 2 Cans of beer per week    Comment: rare beer.  . Drug use: No  . Sexual activity: Yes  Other Topics Concern  . Not on file  Social History Narrative  Lives in [place] with wife. Moved from [place] (2013). Does not routinely exercise.   Social Determinants of Health   Financial Resource Strain:   . Difficulty of Paying Living Expenses: Not on file  Food Insecurity:   . Worried About Charity fundraiser in the Last Year: Not on file  . Ran Out of Food in the Last Year: Not on file  Transportation Needs:   . Lack of Transportation (Medical): Not on file  . Lack of Transportation (Non-Medical): Not on file  Physical Activity:   . Days of Exercise per Week: Not on file  . Minutes of Exercise per Session: Not on file  Stress:   . Feeling of Stress : Not on file  Social Connections:   . Frequency of Communication with Friends and Family: Not on file  . Frequency of Social Gatherings with Friends and Family: Not on file  . Attends Religious Services: Not on file  . Active Member of Clubs or Organizations: Not on file  . Attends Archivist Meetings: Not on file  . Marital Status: Not on file  Intimate Partner Violence:   . Fear of Current or Ex-Partner: Not on file  . Emotionally Abused: Not on file  . Physically Abused: Not on file  . Sexually Abused: Not on file    Physical Exam      Future  Appointments  Date Time Provider [place]  01/28/2020  1:45 PM Edrick Kins, DPM [facility]  03/04/2020  1:45 PM Deboraha Sprang, MD [facility]  03/21/2020  7:35 AM [facility] DEVICE REMOTES [facility]  03/27/2020 11:00 AM [facility] ECHO OP 1 [facility]  03/27/2020 12:00 PM Larey Dresser, MD [facility] None  06/20/2020  7:35 AM [facility] DEVICE REMOTES [facility]  09/19/2020  7:35 AM [facility] DEVICE REMOTES [facility]    BP 112/70   Pulse (!) 112   Temp 97.8 F (36.6 C)   Resp 18   Wt 201 lb (91.2 kg)   SpO2 96%   BMI 31.48 kg/m   Weight yesterday-201 Last visit weight-199  Pt reports feeling ok, he denies increased sob, no dizziness, no c/p. No palpitations. The cardioversion did not work. On his last EKG it did not show afib. Just tachy.  Today his HR is still tachy as noted. He denies feeling more fatigued or tired. HR is regular though.  He misses numerous doses of his meds. Esp the bed time dose which includes his eliquis. I told him the importance of taking all his meds, he falls asleep in the evenings and doesn't get to take them. I told him to set his alarm on his phone to ensure he takes his meds.  meds verified and 2 pill boxes refilled. Due back to be seen on 3/8.  Wearing compression stockings. He does have edema noted. But not too bad.   Marylouise Stacks, Roberta [facility] Paramedic  01/22/20

## 2020-01-28 ENCOUNTER — Ambulatory Visit: Payer: Non-veteran care | Admitting: Podiatry

## 2020-02-01 ENCOUNTER — Other Ambulatory Visit: Payer: Self-pay

## 2020-02-01 ENCOUNTER — Encounter (HOSPITAL_COMMUNITY): Payer: Self-pay | Admitting: Physician Assistant

## 2020-02-01 ENCOUNTER — Ambulatory Visit (HOSPITAL_COMMUNITY)
Admission: RE | Admit: 2020-02-01 | Discharge: 2020-02-01 | Disposition: A | Discharge status: Home / Self Care | Payer: No Typology Code available for payment source | Source: Ambulatory Visit | Attending: Physician Assistant | Admitting: Physician Assistant

## 2020-02-01 VITALS — BP 110/70 | HR 111 | Ht 67.0 in | Wt 205.0 lb

## 2020-02-01 DIAGNOSIS — Z7901 Long term (current) use of anticoagulants: Secondary | ICD-10-CM | POA: Insufficient documentation

## 2020-02-01 DIAGNOSIS — Z87891 Personal history of nicotine dependence: Secondary | ICD-10-CM | POA: Insufficient documentation

## 2020-02-01 DIAGNOSIS — N179 Acute kidney failure, unspecified: Secondary | ICD-10-CM | POA: Diagnosis not present

## 2020-02-01 DIAGNOSIS — I2510 Atherosclerotic heart disease of native coronary artery without angina pectoris: Secondary | ICD-10-CM | POA: Insufficient documentation

## 2020-02-01 DIAGNOSIS — Z6832 Body mass index (BMI) 32.0-32.9, adult: Secondary | ICD-10-CM | POA: Diagnosis not present

## 2020-02-01 DIAGNOSIS — M109 Gout, unspecified: Secondary | ICD-10-CM | POA: Insufficient documentation

## 2020-02-01 DIAGNOSIS — E669 Obesity, unspecified: Secondary | ICD-10-CM | POA: Diagnosis not present

## 2020-02-01 DIAGNOSIS — M1990 Unspecified osteoarthritis, unspecified site: Secondary | ICD-10-CM | POA: Diagnosis not present

## 2020-02-01 DIAGNOSIS — I480 Paroxysmal atrial fibrillation: Secondary | ICD-10-CM

## 2020-02-01 DIAGNOSIS — N189 Chronic kidney disease, unspecified: Secondary | ICD-10-CM | POA: Insufficient documentation

## 2020-02-01 DIAGNOSIS — Z79899 Other long term (current) drug therapy: Secondary | ICD-10-CM | POA: Insufficient documentation

## 2020-02-01 DIAGNOSIS — I4892 Unspecified atrial flutter: Secondary | ICD-10-CM | POA: Insufficient documentation

## 2020-02-01 DIAGNOSIS — I130 Hypertensive heart and chronic kidney disease with heart failure and stage 1 through stage 4 chronic kidney disease, or unspecified chronic kidney disease: Secondary | ICD-10-CM | POA: Diagnosis not present

## 2020-02-01 DIAGNOSIS — D6869 Other thrombophilia: Secondary | ICD-10-CM | POA: Insufficient documentation

## 2020-02-01 DIAGNOSIS — E7800 Pure hypercholesterolemia, unspecified: Secondary | ICD-10-CM | POA: Diagnosis not present

## 2020-02-01 DIAGNOSIS — I5022 Chronic systolic (congestive) heart failure: Secondary | ICD-10-CM | POA: Diagnosis not present

## 2020-02-01 DIAGNOSIS — Z7984 Long term (current) use of oral hypoglycemic drugs: Secondary | ICD-10-CM | POA: Insufficient documentation

## 2020-02-01 DIAGNOSIS — E1136 Type 2 diabetes mellitus with diabetic cataract: Secondary | ICD-10-CM | POA: Diagnosis not present

## 2020-02-01 MED ORDER — CARVEDILOL 3.125 MG PO TABS: 3.1250 mg | ORAL_TABLET | Freq: Two times a day (BID) | ORAL | 3 refills | Status: DC

## 2020-02-01 NOTE — Progress Notes (Signed)
Primary Care Physician: [facility], [place] Primary Cardiologist: Dr Aundra Dubin Primary Electrophysiologist: Dr Caryl Comes Referring Physician: Dr Aundra Dubin   Ryan Macdonald is a 86 y.o. male with a history of systolic HF due toNICM, Biotronic ICD, persistent A Fib, DMII, HTN,CAD s/p pLAD stent in 07/2018 with residual non-obstructive disease,chronic hypoxic respiratory failure on home O2 who presents for follow up in the [facility]. Patient is on Eliquis for a CHADS2VASC score of 6. Patient was noted to be in afib per Marylouise Stacks on a home visit on 01/10/20. Patient underwent TEE/DCCV on 01/15/20 which was unsuccessful after 4 attempts. At his follow up visit 01/16/20, he was back in [place]. Patient seen by paramedicine 01/31/20 and was found to be in afib with HR 100s-120s and feeling more SOB. He reports that he is feeling better this AM and is about back to his baseline. He denies any weight gain or increased lower extremity edema. There were no triggers for his most recent episode that he could identify. His heart rate today remains ~110 and regular.  Today, he denies symptoms of palpitations, chest pain, shortness of breath, orthopnea, PND, dizziness, presyncope, syncope, snoring, daytime somnolence, bleeding, or neurologic sequela. The patient is tolerating medications without difficulties and is otherwise without complaint today.    Atrial Fibrillation Risk Factors:  he does not have symptoms or diagnosis of sleep apnea. he does not have a history of rheumatic fever.   he has a BMI of Body mass index is 32.11 kg/m.Marland Kitchen Filed Weights   02/01/20 1133  Weight: 93 kg    Family History  Problem Relation Age of Onset  . Heart disease Mother   . Heart Problems Maternal Grandmother   . Other Other        negative for premature CAD     Atrial Fibrillation Management history:  Previous antiarrhythmic drugs: amiodarone Previous cardioversions: 01/15/20 Previous  ablations: none CHADS2VASC score: 6 Anticoagulation history: Eliquis   Past Medical History:  Diagnosis Date  . AKI (acute kidney injury) ([place]) 02/2019  . Atrial fibrillation ([place])   . Cardiomyopathy ([place])   . Cat scratch fever    removed mass on right side neck  . Cataracts, bilateral   . CHF (congestive heart failure) ([place])   . Chronic kidney disease   . Corns and callosities   . Diabetes mellitus without complication ([place])    TYPE 2  . Dyspnea   . Erythema intertrigo   . Flat foot   . Gout   . High cholesterol   . Hx of urethral stricture   . Hypertension   . Kidney stones   . Morbid obesity ([place])   . Nonischemic cardiomyopathy ([place])    a. ? 2009 Cath in [place] - nl cors per pt;  b. 09/2013 Echo: EF 25-30%, sev diff HK.  . Obesity   . Osteoarthritis   . PAF (paroxysmal atrial fibrillation) ([place])    a. post-op hip in 2014 - prev on xarelto.  . Renal insufficiency    Past Surgical History:  Procedure Laterality Date  . CARDIAC CATHETERIZATION  1980's  . CARDIOVERSION N/A 01/15/2020   Procedure: CARDIOVERSION;  Surgeon: Larey Dresser, MD;  Location: [facility] ENDOSCOPY;  Service: Cardiovascular;  Laterality: N/A;  . CIRCUMCISION  1940's  . CORONARY STENT INTERVENTION N/A 08/18/2018   Procedure: CORONARY STENT INTERVENTION;  Surgeon: Jettie Booze, MD;  Location: [facility] CV LAB;  Service: Cardiovascular;  Laterality: N/A;  . CYSTOSCOPY  WITH URETHRAL DILATATION  10/23/2013  . CYSTOSCOPY WITH URETHRAL DILATATION N/A 10/23/2013   Procedure: CYSTOSCOPY WITH URETHRAL DILATATION;  Surgeon: Kathi Ludwig, MD;  [facility];  Service: Urology;  Laterality: N/A;  . INCISION AND DRAINAGE OF WOUND Right 1980's   "cat scratch" (10/23/2013)  . INGUINAL HERNIA REPAIR Right 1950's  . INTRAVASCULAR ULTRASOUND/IVUS N/A 08/18/2018   Procedure: Intravascular Ultrasound/IVUS;  Surgeon: Corky Crafts, MD;  Location[facility] INVASIVE CV LAB;  Service: Cardiovascular;   Laterality: N/A;  . KIDNEY STONE SURGERY Right 1970's; 1983   "twice"  . LEFT HEART CATH AND CORONARY ANGIOGRAPHY N/A 08/18/2018   Procedure: LEFT HEART CATH AND CORONARY ANGIOGRAPHY;  Surgeon: Corky Crafts, MD;  Location: [facility] INVASIVE CV LAB;  Service: Cardiovascular;  Laterality: N/A;  . TEE WITHOUT CARDIOVERSION N/A 01/15/2020   Procedure: TRANSESOPHAGEAL ECHOCARDIOGRAM (TEE);  Surgeon: Laurey Morale, MD;  Location: [facility] ENDOSCOPY;  Service: Cardiovascular;  Laterality: N/A;  . TONSILLECTOMY AND ADENOIDECTOMY  1940's  . TOTAL HIP ARTHROPLASTY Left 10/23/2013   Procedure: TOTAL HIP ARTHROPLASTY;  Surgeon: Valeria Batman, MD;  Location[facility] OR;  Service: Orthopedics;  Laterality: Left;    Current Outpatient Medications  Medication Sig Dispense Refill  . allopurinol (ZYLOPRIM) 100 MG tablet Take 200 mg by mouth daily.     Marland Kitchen apixaban (ELIQUIS) 2.5 MG TABS tablet Take 1 tablet (2.5 mg total) by mouth 2 (two) times daily. 90 tablet 0  . cyanocobalamin 1000 MCG tablet Take 1 tablet (1,000 mcg total) by mouth daily. 90 tablet 0  . diclofenac sodium (VOLTAREN) 1 % GEL Apply 4 g topically 4 (four) times daily. 1 Tube 0  . docusate sodium (COLACE) 100 MG capsule Take 100 mg by mouth 2 (two) times daily.    . finasteride (PROSCAR) 5 MG tablet Take 1 tablet (5 mg total) by mouth daily. 30 tablet 0  . glipiZIDE (GLUCOTROL) 5 MG tablet Take 5 mg by mouth daily before breakfast.    . hydrALAZINE (APRESOLINE) 25 MG tablet Take 75 mg by mouth 3 (three) times daily.    . hydrALAZINE (APRESOLINE) 50 MG tablet Take 1.5 tablets (75 mg total) by mouth 3 (three) times daily. 405 tablet 3  . HYDROcodone-acetaminophen (NORCO/VICODIN) 5-325 MG tablet Take 2 tablets by mouth every 4 (four) hours as needed. (Patient taking differently: Take 2 tablets by mouth every 4 (four) hours as needed (for pain.). ) 6 tablet 0  . ipratropium-albuterol (DUONEB) 0.5-2.5 (3) MG/3ML SOLN Take 3 mLs by nebulization every 4  (four) hours as needed. (Patient taking differently: Take 3 mLs by nebulization every 4 (four) hours as needed (wheezing/shortness of breath). ) 360 mL 0  . isosorbide mononitrate (IMDUR) 30 MG 24 hr tablet Take 90 mg by mouth daily.    . isosorbide mononitrate (IMDUR) 60 MG 24 hr tablet Take 1.5 tablets (90 mg total) by mouth daily. 135 tablet 3  . ketoconazole (NIZORAL) 2 % shampoo Apply 1 application topically 2 (two) times a week.    . levothyroxine (SYNTHROID) 125 MCG tablet Take 1 tablet (125 mcg total) by mouth daily before breakfast. 90 tablet 1  . magnesium oxide (MAG-OX) 400 MG tablet Take 400 mg by mouth daily.    . methocarbamol (ROBAXIN) 500 MG tablet Take 1 tablet (500 mg total) by mouth every 6 (six) hours as needed for muscle spasms. 30 tablet 0  . nitroGLYCERIN (NITROSTAT) 0.4 MG SL tablet Place 1 tablet (0.4 mg total) under the tongue  every 5 (five) minutes x 3 doses as needed for chest pain. 25 tablet 0  . rosuvastatin (CRESTOR) 10 MG tablet Take 1 tablet (10 mg total) by mouth daily at 6 PM. 30 tablet 5  . sertraline (ZOLOFT) 25 MG tablet Take 25 mg by mouth daily.     Marland Kitchen spironolactone (ALDACTONE) 25 MG tablet Take 1 tablet (25 mg total) by mouth daily. 30 tablet 6  . tamsulosin (FLOMAX) 0.4 MG CAPS capsule Take 0.4 mg by mouth at bedtime.    . torsemide (DEMADEX) 20 MG tablet Take 2 tablets (40 mg total) by mouth daily.    . carvedilol (COREG) 3.125 MG tablet Take 1 tablet (3.125 mg total) by mouth 2 (two) times daily with a meal. 60 tablet 3   No current facility-administered medications for this encounter.    Allergies  Allergen Reactions  . Lisinopril Swelling    Facial swelling  . Xarelto [Rivaroxaban] Other (See Comments)    "Blood came out of my penis"    Social History   Socioeconomic History  . Marital status: Married    Spouse name: Not on file  . Number of children: Not on file  . Years of education: Not on file  . Highest education level: Not on file   Occupational History  . Occupation: Retired    Comment: Former Public house manager  Tobacco Use  . Smoking status: Former Smoker    Packs/day: 1.00    Years: 15.00    Pack years: 15.00    Types: Cigarettes, Cigars  . Smokeless tobacco: Former Neurosurgeon  . Tobacco comment: quit smoking ~ 50 yr ago  Substance and Sexual Activity  . Alcohol use: No    Alcohol/week: 2.0 standard drinks    Types: 2 Cans of beer per week    Comment: rare beer.  . Drug use: No  . Sexual activity: Yes  Other Topics Concern  . Not on file  Social History Narrative   Lives in [place] with wife. Moved from [place] (2013). Does not routinely exercise.   Social Determinants of Health   Financial Resource Strain:   . Difficulty of Paying Living Expenses: Not on file  Food Insecurity:   . Worried About Programme researcher, broadcasting/film/video in the Last Year: Not on file  . Ran Out of Food in the Last Year: Not on file  Transportation Needs:   . Lack of Transportation (Medical): Not on file  . Lack of Transportation (Non-Medical): Not on file  Physical Activity:   . Days of Exercise per Week: Not on file  . Minutes of Exercise per Session: Not on file  Stress:   . Feeling of Stress : Not on file  Social Connections:   . Frequency of Communication with Friends and Family: Not on file  . Frequency of Social Gatherings with Friends and Family: Not on file  . Attends Religious Services: Not on file  . Active Member of Clubs or Organizations: Not on file  . Attends Banker Meetings: Not on file  . Marital Status: Not on file  Intimate Partner Violence:   . Fear of Current or Ex-Partner: Not on file  . Emotionally Abused: Not on file  . Physically Abused: Not on file  . Sexually Abused: Not on file     ROS- All systems are reviewed and negative except as per the HPI above.  Physical Exam: Vitals:   02/01/20 1133  BP: 110/70  Pulse: (!) 111  Weight: 93  kg  Height: 5\' 7"  (1.702 m)    GEN- The patient is well  appearing obese elderly male, alert and oriented x 3 today.   HEENT-head normocephalic, atraumatic, sclera clear, conjunctiva pink, hearing intact, trachea midline. Lungs- Clear to ausculation bilaterally, normal work of breathing Heart- Regular rate and rhythm, tachycardia, no murmurs, rubs or gallops  GI- soft, NT, ND, + BS Extremities- no clubbing, cyanosis. 1+ bilateral edema MS- no significant deformity or atrophy Skin- no rash or lesion Psych- euthymic mood, full affect Neuro- strength and sensation are intact   Wt Readings from Last 3 Encounters:  02/01/20 93 kg  01/22/20 91.2 kg  01/16/20 90.3 kg    EKG today demonstrates SVT HR 111, PAC, QRS 100, QTc 429  Echo 11/24/18 demonstrated  Left ventricle: The cavity size was normal. Wall thickness was  normal. Systolic function was severely reduced. The estimated  ejection fraction was in the range of 20% to 25%. Diffuse  hypokinesis. Doppler parameters are consistent with a reversible  restrictive pattern, indicative of decreased left ventricular  diastolic compliance and/or increased left atrial pressure (grade  3 diastolic dysfunction).  - Aortic valve: Valve mobility was mildly restricted. There was  trivial regurgitation.  - Mitral valve: There was moderate regurgitation.  - Left atrium: The atrium was mildly to moderately dilated.  - Right ventricle: The cavity size was mildly dilated. Wall  thickness was normal. Systolic function was moderately reduced.  - Right atrium: The atrium was mildly dilated. Central venous  pressure (est): 10 mm Hg.  - Tricuspid valve: There was trivial regurgitation.  - Pulmonary arteries: Systolic pressure was mildly increased. PA  peak pressure: 39 mm Hg (S).  - Inferior vena cava: The vessel was normal in size. The  respirophasic diameter changes were blunted (< 50%).  - Pericardium, extracardiac: There was no pericardial effusion.   Impressions:   - Compared to  the prior exam, mitral regurgitation may have  increased slightly. Otherwise,no significant change has occured.  Side by side comparison of images performed.   Epic records are reviewed at length today  CHA2DS2-VASc Score = 6 The patient's score is based upon: CHF History: Yes HTN History: Yes Age : 78 + Diabetes History: Yes Stroke History: No Vascular Disease History: Yes Gender: Male   ASSESSMENT AND PLAN: 1. Paroxysmal Atrial Fibrillation/atrial flutter/atrial tach The patient's CHA2DS2-VASc score is 6, indicating a 9.7% annual risk of stroke.   Failed amiodarone previously with significant hypothyroidism. Would not use class 1C or Multaq with CHF. ? Compliance with dofetilide given missed doses of numerous meds per paramedicine report.  Will plan to start Coreg 3.125 mg BID for rate control. Will d/w Dr 61.  Continue Eliquis 2.5 mg BID  2. Secondary Hypercoagulable State (ICD10:  D68.69) The patient is at significant risk for stroke/thromboembolism based upon his CHA2DS2-VASc Score of 6.  Continue Apixaban (Eliquis).   3. Obesity Body mass index is 32.11 kg/m. Lifestyle modification was discussed and encouraged including regular physical activity and weight reduction.  4. HTN Stable, med changes as above.  5. CAD S/p PCI 2019 No anginal symptoms.  6. Chronic systolic CHF EF 20-25%. ICD (VDD) No signs or symptoms of fluid overload. Followed by Dr 2020.   Follow up with Dr Shirlee Latch in 1-2 weeks. Dr Graciela Husbands as scheduled.    Shirlee Latch PA-C [facility] [address] [place], [place] [phone] 02/01/2020 12:14 PM

## 2020-02-01 NOTE — Patient Instructions (Signed)
Start coreg 3.125mg  twice a day   Follow up with Dr. Graciela Husbands in 1 week - I will have the scheduler call you.

## 2020-02-04 ENCOUNTER — Ambulatory Visit (INDEPENDENT_AMBULATORY_CARE_PROVIDER_SITE_OTHER): Payer: Self-pay | Admitting: Internal Medicine

## 2020-02-04 ENCOUNTER — Other Ambulatory Visit: Payer: Self-pay

## 2020-02-04 ENCOUNTER — Other Ambulatory Visit (HOSPITAL_COMMUNITY): Payer: Self-pay

## 2020-02-04 ENCOUNTER — Encounter: Payer: Self-pay | Admitting: Internal Medicine

## 2020-02-04 VITALS — BP 112/64 | HR 82 | Ht 67.0 in | Wt 199.0 lb

## 2020-02-04 DIAGNOSIS — I480 Paroxysmal atrial fibrillation: Secondary | ICD-10-CM

## 2020-02-04 DIAGNOSIS — I428 Other cardiomyopathies: Secondary | ICD-10-CM

## 2020-02-04 DIAGNOSIS — I10 Essential (primary) hypertension: Secondary | ICD-10-CM

## 2020-02-04 DIAGNOSIS — I5022 Chronic systolic (congestive) heart failure: Secondary | ICD-10-CM

## 2020-02-04 MED ORDER — TORSEMIDE 20 MG PO TABS: 60.0000 mg | ORAL_TABLET | Freq: Every day | ORAL | 3 refills | Status: DC

## 2020-02-04 NOTE — Patient Instructions (Addendum)
Medication Instructions: Your physician has recommended you make the following change in your medication:  **Increase your Demadex to 80mg  by mouth starting today by mouth. (4 tablets) Tuesday 03/09 - 40mg  by mouth (2tabs) Wednesday 03/10 - 80mg  by mouth (4 tablets) Thursday 03/11 - 40mg  by mouth (2 tablets) Friday 03/12 - 80mg  by mouth (4 tablets) Saturday 03/13 - 40mg  by mouth (2tablets) Sunday 03/14 - 80mg  by mouth (4 tablets)  *Beginning next Monday 02/11/2020 begin 60mg  by mouth (3 tablets daily)   Labwork:  BMET in 2 weeks 02/18/2020 at 1130am    Testing/Procedures: None ordered.  Follow-Up: Your physician wants you to follow-up in:12 months with Dr will receive a reminder letter in the mail two months in advance. If you don't receive a letter, please call our office to schedule the follow-up appointment.  Remote monitoring is used to monitor your Pacemaker of ICD from home. This monitoring reduces the number of office visits required to check your device to one time per year. It allows Saturday to keep an eye on the functioning of your device to ensure it is working properly.   Any Other Special Instructions Will Be Listed Below (If Applicable).  ** Decrease your liquid intake to no more than 3 liters a day.  If you need a refill on your cardiac medications before your next appointment, please call your pharmacy.

## 2020-02-04 NOTE — Progress Notes (Signed)
Paramedicine Encounter    Patient ID: Ryan Macdonald, male    DOB: January 01, 1934, 86 y.o.   MRN: [id number]   Patient Care Team: [facility], [facility] as PCP - General (General Practice) [name], MD as PCP - Cardiology (Cardiology) Laurey Morale, MD as PCP - Advanced Heart Failure (Cardiology)  Patient Active Problem List   Diagnosis Date Noted  . Secondary hypercoagulable state (HCC) 01/16/2020  . AKI (acute kidney injury) (HCC) 03/14/2019  . Spasm of muscle of lower back 03/14/2019  . Acute renal failure (ARF) (HCC) 03/09/2019  . Transaminitis   . Acute on chronic renal failure (HCC) 03/08/2019  . Acute exacerbation of CHF (congestive heart failure) (HCC) 11/23/2018  . Acute on chronic heart failure (HCC) 10/19/2018  . Pulmonary emphysema (HCC)   . Chronic respiratory failure with hypoxia (HCC)   . Morbid obesity (HCC) 09/09/2018  . URI (upper respiratory infection) 08/31/2018  . UTI (urinary tract infection) 08/31/2018  . Chronic systolic (congestive) heart failure (HCC) 08/28/2018  . CHF (congestive heart failure) (HCC) 08/14/2018  . Acute on chronic combined systolic and diastolic CHF (congestive heart failure) (HCC) 07/18/2018  . First degree AV block   . Shortness of breath 04/29/2018  . Acute HFrEF (heart failure with reduced ejection fraction) (HCC) 04/29/2018  . Acute respiratory failure with hypoxia (HCC) 09/27/2017  . CHF exacerbation (HCC) 09/27/2017  . Renal insufficiency   . Acute on chronic systolic CHF (congestive heart failure) (HCC) 07/08/2017  . Left knee pain 07/08/2017  . Acute bronchitis 05/10/2017  . Acute congestive heart failure (HCC) 04/14/2017  . Elevated troponin 04/14/2017  . Lactic acidosis   . Acute on chronic systolic heart failure, NYHA class 2 (HCC) 02/03/2017  . Diabetes mellitus type 2 in obese (HCC) 02/03/2017  . CAP (community acquired pneumonia) 02/03/2017  . Lobar pneumonia (HCC)   . Diabetes mellitus with complication  (HCC)   . Acute on chronic respiratory failure with hypoxia (HCC)   . Acute on chronic combined systolic and diastolic heart failure (HCC) 05/19/2016  . Depression 05/19/2016  . Gout 05/19/2016  . 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 NSVT (nonsustained ventricular tachycardia) (HCC)   . Chronic renal insufficiency, stage 3 (moderate) (HCC)   . Hyperkalemia 08/21/2015  . Pulmonary embolism (HCC)   . Acute respiratory failure (HCC)   . Osteoarthritis of left hip 10/25/2013  . Atrial fibrillation (HCC) 10/25/2013  . S/P hip replacement 10/24/2013  . Nonischemic cardiomyopathy (HCC)   . Obesity (BMI 30-39.9)   . Essential hypertension   . Hyperlipidemia     Current Outpatient Medications:  .  allopurinol (ZYLOPRIM) 100 MG tablet, Take 200 mg by mouth daily. , Disp: , Rfl:  .  apixaban (ELIQUIS) 2.5 MG TABS tablet, Take 1 tablet (2.5 mg total) by mouth 2 (two) times daily., Disp: 90 tablet, Rfl: 0 .  carvedilol (COREG) 3.125 MG tablet, Take 1 tablet (3.125 mg total) by mouth 2 (two) times daily with a meal., Disp: 60 tablet, Rfl: 3 .  cyanocobalamin 1000 MCG tablet, Take 1 tablet (1,000 mcg total) by mouth daily., Disp: 90 tablet, Rfl: 0 .  diclofenac sodium (VOLTAREN) 1 % GEL, Apply 4 g topically 4 (four) times daily., Disp: 1 Tube, Rfl: 0 .  docusate sodium (COLACE) 100 MG capsule, Take 100 mg by mouth 2 (two) times daily., Disp: , Rfl:  .  finasteride (PROSCAR) 5 MG tablet, Take 1 tablet (5 mg total) by mouth daily., Disp: 30  tablet, Rfl: 0 .  glipiZIDE (GLUCOTROL) 5 MG tablet, Take 5 mg by mouth daily before breakfast., Disp: , Rfl:  .  hydrALAZINE (APRESOLINE) 25 MG tablet, Take 75 mg by mouth 3 (three) times daily., Disp: , Rfl:  .  hydrALAZINE (APRESOLINE) 50 MG tablet, Take 1.5 tablets (75 mg total) by mouth 3 (three) times daily., Disp: 405 tablet, Rfl: 3 .  HYDROcodone-acetaminophen (NORCO/VICODIN) 5-325 MG tablet, Take 2 tablets by mouth every 4  (four) hours as needed., Disp: 6 tablet, Rfl: 0 .  ipratropium-albuterol (DUONEB) 0.5-2.5 (3) MG/3ML SOLN, Take 3 mLs by nebulization every 4 (four) hours as needed. (Patient taking differently: Take 3 mLs by nebulization every 4 (four) hours as needed (wheezing/shortness of breath). ), Disp: 360 mL, Rfl: 0 .  isosorbide mononitrate (IMDUR) 30 MG 24 hr tablet, Take 90 mg by mouth daily., Disp: , Rfl:  .  ketoconazole (NIZORAL) 2 % shampoo, Apply 1 application topically 2 (two) times a week., Disp: , Rfl:  .  levothyroxine (SYNTHROID) 125 MCG tablet, Take 1 tablet (125 mcg total) by mouth daily before breakfast., Disp: 90 tablet, Rfl: 1 .  magnesium oxide (MAG-OX) 400 MG tablet, Take 400 mg by mouth daily., Disp: , Rfl:  .  methocarbamol (ROBAXIN) 500 MG tablet, Take 1 tablet (500 mg total) by mouth every 6 (six) hours as needed for muscle spasms., Disp: 30 tablet, Rfl: 0 .  rosuvastatin (CRESTOR) 10 MG tablet, Take 1 tablet (10 mg total) by mouth daily at 6 PM., Disp: 30 tablet, Rfl: 5 .  sertraline (ZOLOFT) 25 MG tablet, Take 25 mg by mouth daily. , Disp: , Rfl:  .  spironolactone (ALDACTONE) 25 MG tablet, Take 1 tablet (25 mg total) by mouth daily., Disp: 30 tablet, Rfl: 6 .  tamsulosin (FLOMAX) 0.4 MG CAPS capsule, Take 0.4 mg by mouth at bedtime., Disp: , Rfl:  .  torsemide (DEMADEX) 20 MG tablet, Take 3 tablets (60 mg total) by mouth daily., Disp: 270 tablet, Rfl: 3 .  isosorbide mononitrate (IMDUR) 60 MG 24 hr tablet, Take 1.5 tablets (90 mg total) by mouth daily. (Patient not taking: Reported on 02/04/2020), Disp: 135 tablet, Rfl: 3 .  nitroGLYCERIN (NITROSTAT) 0.4 MG SL tablet, Place 1 tablet (0.4 mg total) under the tongue every 5 (five) minutes x 3 doses as needed for chest pain. (Patient not taking: Reported on 02/04/2020), Disp: 25 tablet, Rfl: 0 Allergies  Allergen Reactions  . Lisinopril Swelling    Facial swelling  . Xarelto [Rivaroxaban] Other (See Comments)    "Blood came out of my  penis"      Social History   Socioeconomic History  . Marital status: Married    Spouse name: Not on file  . Number of children: Not on file  . Years of education: Not on file  . Highest education level: Not on file  Occupational History  . Occupation: Retired    Comment: Former Public house manager  Tobacco Use  . Smoking status: Former Smoker    Packs/day: 1.00    Years: 15.00    Pack years: 15.00    Types: Cigarettes, Cigars  . Smokeless tobacco: Former Neurosurgeon  . Tobacco comment: quit smoking ~ 50 yr ago  Substance and Sexual Activity  . Alcohol use: No    Alcohol/week: 2.0 standard drinks    Types: 2 Cans of beer per week    Comment: rare beer.  . Drug use: No  . Sexual activity: Yes  Other Topics Concern  .  Not on file  Social History Narrative   Lives in [place] with wife. Moved from [place] (2013). Does not routinely exercise.   Social Determinants of Health   Financial Resource Strain:   . Difficulty of Paying Living Expenses: Not on file  Food Insecurity:   . Worried About Charity fundraiser in the Last Year: Not on file  . Ran Out of Food in the Last Year: Not on file  Transportation Needs:   . Lack of Transportation (Medical): Not on file  . Lack of Transportation (Non-Medical): Not on file  Physical Activity:   . Days of Exercise per Week: Not on file  . Minutes of Exercise per Session: Not on file  Stress:   . Feeling of Stress : Not on file  Social Connections:   . Frequency of Communication with Friends and Family: Not on file  . Frequency of Social Gatherings with Friends and Family: Not on file  . Attends Religious Services: Not on file  . Active Member of Clubs or Organizations: Not on file  . Attends Archivist Meetings: Not on file  . Marital Status: Not on file  Intimate Partner Violence:   . Fear of Current or Ex-Partner: Not on file  . Emotionally Abused: Not on file  . Physically Abused: Not on file  . Sexually Abused: Not on file     Physical Exam      Future Appointments  Date Time Provider [place]  02/13/2020  2:30 PM Edrick Kins, DPM [facility]  02/18/2020 11:30 AM [facility]  03/21/2020  7:35 AM [facility] DEVICE REMOTES [facility]  03/27/2020 11:00 AM [facility] ECHO OP 1 [facility]  03/27/2020 12:00 PM Larey Dresser, MD [facility] None  06/20/2020  7:35 AM [facility] DEVICE REMOTES [facility]  09/19/2020  7:35 AM [facility] DEVICE REMOTES [facility]    BP 112/72   Pulse 94   Resp 18   SpO2 97%   Weight yesterday-202  Last visit weight-201  Pt was seen at dr Caryl Comes office today with some dose changes to his torsemide. Pt reports he is feeling very tired, did not even come to kitchen area like he normally does to fill pill box with me. He is wearing his 02 today. He denies any c/p.  He has not taken any meds this morning yet so that dose of torsemide and morning meds just taken this afternoon around 330pm.  He will p/u for his bedtime dose tonight.   afib clinic started him on carvedilol last week. Pt has been taking it since Friday.  His HR was below 100, still irregular but slowed down.  meds verified and pill box refilled.    Marylouise Stacks, [facility] Paramedic  02/04/20

## 2020-02-04 NOTE — Progress Notes (Signed)
Patient Care Team: [facility], [place] as PCP - General ([place]) Skeet Latch, MD as PCP - Cardiology (Cardiology) Larey Dresser, MD as PCP - Advanced Heart Failure (Cardiology)   HPI  Ryan Macdonald is a 86 y.o. male seen in follow-up for an Biotronik (VDD) ICD implanted elsewhere and followed by the [place].  We had met in hospital when we had reprogrammed his device VVI---VDD.  Has history of atrial fibrillation treated with amiodarone and Eliquis;.  Severe hypothyroidism and amiodarone discontinued.  Has had interval atrial fibrillation detected on a home visit.  Underwent TEE cardioversion on 01/15/2020 "unsuccessful "but was subsequently found to "sinus tachycardia ".  Has significant increase in peripheral edema and dyspnea on exertion   No chest pain  Is been noted to have significant conduction system disease and so there has been a reluctance to use beta-blockers.  This was started in the A. fib clinic recently.  DATE TEST EF   9/19 LHC   % pLAD 75>>stent  12/19 Echo   20-25 % MR Mod; LAE (48/2.15/30)        Date BUN/Cr K Hgb TSH LHTs  4/20 21/1.96 5.2 10.7 82   5/20 50/1.88 4.2   55   7/20    3.52 42  2/21 35/1.37 (1/21) 4.0 9.9       Records and Results Reviewed   Past Medical History:  Diagnosis Date  . AKI (acute kidney injury) ([place]) 02/2019  . Atrial fibrillation ([place])   . Cardiomyopathy ([place])   . Cat scratch fever    removed mass on right side neck  . Cataracts, bilateral   . CHF (congestive heart failure) ([place])   . Chronic kidney disease   . Corns and callosities   . Diabetes mellitus without complication ([place])    TYPE 2  . Dyspnea   . Erythema intertrigo   . Flat foot   . Gout   . High cholesterol   . Hx of urethral stricture   . Hypertension   . Kidney stones   . Morbid obesity ([place])   . Nonischemic cardiomyopathy ([place])    a. ? 2009 Cath in [place] - nl cors per pt;  b. 09/2013 Echo: EF 25-30%, sev diff HK.  .  Obesity   . Osteoarthritis   . PAF (paroxysmal atrial fibrillation) ([place])    a. post-op hip in 2014 - prev on xarelto.  . Renal insufficiency     Past Surgical History:  Procedure Laterality Date  . CARDIAC CATHETERIZATION  1980's  . CARDIOVERSION N/A 01/15/2020   Procedure: CARDIOVERSION;  Surgeon: Larey Dresser, MD;  Location: [facility] ENDOSCOPY;  Service: Cardiovascular;  Laterality: N/A;  . CIRCUMCISION  1940's  . CORONARY STENT INTERVENTION N/A 08/18/2018   Procedure: CORONARY STENT INTERVENTION;  Surgeon: Jettie Booze, MD;  Location: [place] CV LAB;  Service: Cardiovascular;  Laterality: N/A;  . CYSTOSCOPY WITH URETHRAL DILATATION  10/23/2013  . CYSTOSCOPY WITH URETHRAL DILATATION N/A 10/23/2013   Procedure: CYSTOSCOPY WITH URETHRAL DILATATION;  Surgeon: Ailene Rud, MD;  Location: [place];  Service: Urology;  Laterality: N/A;  . INCISION AND DRAINAGE OF WOUND Right 1980's   "cat scratch" (10/23/2013)  . INGUINAL HERNIA REPAIR Right 1950's  . INTRAVASCULAR ULTRASOUND/IVUS N/A 08/18/2018   Procedure: Intravascular Ultrasound/IVUS;  Surgeon: Jettie Booze, MD;  Location: [place] CV LAB;  Service: Cardiovascular;  Laterality: N/A;  . KIDNEY STONE SURGERY Right 1970's; 1983   "twice"  .  LEFT HEART CATH AND CORONARY ANGIOGRAPHY N/A 08/18/2018   Procedure: LEFT HEART CATH AND CORONARY ANGIOGRAPHY;  Surgeon: Jettie Booze, MD;  Location: [place] CV LAB;  Service: Cardiovascular;  Laterality: N/A;  . TEE WITHOUT CARDIOVERSION N/A 01/15/2020   Procedure: TRANSESOPHAGEAL ECHOCARDIOGRAM (TEE);  Surgeon: Larey Dresser, MD;  Location: [facility] ENDOSCOPY;  Service: Cardiovascular;  Laterality: N/A;  . TONSILLECTOMY AND ADENOIDECTOMY  1940's  . TOTAL HIP ARTHROPLASTY Left 10/23/2013   Procedure: TOTAL HIP ARTHROPLASTY;  Surgeon: Garald Balding, MD;  Location: [place];  Service: Orthopedics;  Laterality: Left;    Current Meds  Medication Sig  . allopurinol  (ZYLOPRIM) 100 MG tablet Take 200 mg by mouth daily.   Marland Kitchen apixaban (ELIQUIS) 2.5 MG TABS tablet Take 1 tablet (2.5 mg total) by mouth 2 (two) times daily.  . carvedilol (COREG) 3.125 MG tablet Take 1 tablet (3.125 mg total) by mouth 2 (two) times daily with a meal.  . cyanocobalamin 1000 MCG tablet Take 1 tablet (1,000 mcg total) by mouth daily.  . diclofenac sodium (VOLTAREN) 1 % GEL Apply 4 g topically 4 (four) times daily.  Marland Kitchen docusate sodium (COLACE) 100 MG capsule Take 100 mg by mouth 2 (two) times daily.  . finasteride (PROSCAR) 5 MG tablet Take 1 tablet (5 mg total) by mouth daily.  Marland Kitchen glipiZIDE (GLUCOTROL) 5 MG tablet Take 5 mg by mouth daily before breakfast.  . hydrALAZINE (APRESOLINE) 25 MG tablet Take 75 mg by mouth 3 (three) times daily.  . hydrALAZINE (APRESOLINE) 50 MG tablet Take 1.5 tablets (75 mg total) by mouth 3 (three) times daily.  Marland Kitchen HYDROcodone-acetaminophen (NORCO/VICODIN) 5-325 MG tablet Take 2 tablets by mouth every 4 (four) hours as needed. (Patient taking differently: Take 2 tablets by mouth every 4 (four) hours as needed (for pain.). )  . ipratropium-albuterol (DUONEB) 0.5-2.5 (3) MG/3ML SOLN Take 3 mLs by nebulization every 4 (four) hours as needed. (Patient taking differently: Take 3 mLs by nebulization every 4 (four) hours as needed (wheezing/shortness of breath). )  . isosorbide mononitrate (IMDUR) 30 MG 24 hr tablet Take 90 mg by mouth daily.  . isosorbide mononitrate (IMDUR) 60 MG 24 hr tablet Take 1.5 tablets (90 mg total) by mouth daily.  Marland Kitchen ketoconazole (NIZORAL) 2 % shampoo Apply 1 application topically 2 (two) times a week.  . levothyroxine (SYNTHROID) 125 MCG tablet Take 1 tablet (125 mcg total) by mouth daily before breakfast.  . magnesium oxide (MAG-OX) 400 MG tablet Take 400 mg by mouth daily.  . methocarbamol (ROBAXIN) 500 MG tablet Take 1 tablet (500 mg total) by mouth every 6 (six) hours as needed for muscle spasms.  . nitroGLYCERIN (NITROSTAT) 0.4 MG SL  tablet Place 1 tablet (0.4 mg total) under the tongue every 5 (five) minutes x 3 doses as needed for chest pain.  . rosuvastatin (CRESTOR) 10 MG tablet Take 1 tablet (10 mg total) by mouth daily at 6 PM.  . sertraline (ZOLOFT) 25 MG tablet Take 25 mg by mouth daily.   Marland Kitchen spironolactone (ALDACTONE) 25 MG tablet Take 1 tablet (25 mg total) by mouth daily.  . tamsulosin (FLOMAX) 0.4 MG CAPS capsule Take 0.4 mg by mouth at bedtime.  . torsemide (DEMADEX) 20 MG tablet Take 2 tablets (40 mg total) by mouth daily.    Allergies  Allergen Reactions  . Lisinopril Swelling    Facial swelling  . Xarelto [Rivaroxaban] Other (See Comments)    "Blood came out of my penis"  Review of Systems negative except from HPI and PMH  Physical Exam BP 112/64   Pulse 82   Ht 5' 7"  (1.702 m)   Wt 199 lb (90.3 kg)   SpO2 98%   BMI 31.17 kg/m  Well developed and well nourished in no acute distress HENT normal E scleral and icterus clear Neck Supple JVP flat; carotids brisk and full Clear to ausculation Rapid  But regular rate and rhythm, no murmurs gallops or rub Progress  Soft with active bowel sounds No clubbing cyanosis 3+ Edema Alert and oriented, grossly normal motor and sensory function Skin Warm and Dry  ECG    Estimated Creatinine Clearance: 42.9 mL/min (A) (by C-G formula based on SCr of 1.4 mg/dL (H)).   Assessment and  Plan Ischemic Cardiomyopathy  CHF acute/chronic systolic  Atrial Fibrillation paroxysmal  High Risk Medication Surveillance-aldactone  ICD- Biotronik VDD   Narrow QRS  Renal Insufficiency Gd 3  Elevated  BUN/Cr ratio-   Anemia   Significantly volume overloaded.  He is not diuresing with his 40 of Demadex daily, so we will increase it to 80 mg alternating with 40 for 1 week.  Thereafter we will have him take 60 mg a day.  We discussed the  threshold affect for his diuretic.  He and his wife voiced understanding.  Also reached out to Dr. DM; he  agrees that with his sinus tachycardia he is a candidate for ivabradine.  Will defer its initiation to the heart failure clinic.  Renal function is stable.  Potassium level adequate on Aldactone.  Evaluation of his heart failure variables demonstrates an increase in heart rate over the last 6 months, decreased heart rate variability over the last 6 months.  Thoracic impedance continues to decline Without symptoms of ischemia  On Anticoagulation;  No bleeding issues   Currently in sinus following recent cardioversion    Current medicines are reviewed at length with the patient today .  The patient does not  have concerns regarding medicines.

## 2020-02-11 ENCOUNTER — Inpatient Hospital Stay (HOSPITAL_COMMUNITY)
Admission: EM | Admit: 2020-02-11 | Discharge: 2020-02-24 | DRG: 286 | Disposition: A | Discharge status: Home Health | Payer: No Typology Code available for payment source | Attending: Internal Medicine | Admitting: Internal Medicine

## 2020-02-11 ENCOUNTER — Encounter (HOSPITAL_COMMUNITY): Payer: Self-pay

## 2020-02-11 ENCOUNTER — Telehealth (HOSPITAL_COMMUNITY): Payer: Self-pay | Admitting: *Deleted

## 2020-02-11 ENCOUNTER — Emergency Department (HOSPITAL_COMMUNITY): Payer: No Typology Code available for payment source

## 2020-02-11 ENCOUNTER — Other Ambulatory Visit: Payer: Self-pay

## 2020-02-11 ENCOUNTER — Other Ambulatory Visit (HOSPITAL_COMMUNITY): Payer: Self-pay

## 2020-02-11 DIAGNOSIS — N183 Chronic kidney disease, stage 3 unspecified: Secondary | ICD-10-CM | POA: Diagnosis not present

## 2020-02-11 DIAGNOSIS — K5900 Constipation, unspecified: Secondary | ICD-10-CM | POA: Diagnosis present

## 2020-02-11 DIAGNOSIS — Z87891 Personal history of nicotine dependence: Secondary | ICD-10-CM

## 2020-02-11 DIAGNOSIS — I440 Atrioventricular block, first degree: Secondary | ICD-10-CM | POA: Diagnosis present

## 2020-02-11 DIAGNOSIS — D638 Anemia in other chronic diseases classified elsewhere: Secondary | ICD-10-CM | POA: Diagnosis present

## 2020-02-11 DIAGNOSIS — E1169 Type 2 diabetes mellitus with other specified complication: Secondary | ICD-10-CM

## 2020-02-11 DIAGNOSIS — I509 Heart failure, unspecified: Secondary | ICD-10-CM

## 2020-02-11 DIAGNOSIS — I5043 Acute on chronic combined systolic (congestive) and diastolic (congestive) heart failure: Secondary | ICD-10-CM | POA: Diagnosis present

## 2020-02-11 DIAGNOSIS — E8770 Fluid overload, unspecified: Secondary | ICD-10-CM

## 2020-02-11 DIAGNOSIS — N1832 Chronic kidney disease, stage 3b: Secondary | ICD-10-CM | POA: Diagnosis present

## 2020-02-11 DIAGNOSIS — K921 Melena: Secondary | ICD-10-CM | POA: Diagnosis not present

## 2020-02-11 DIAGNOSIS — T783XXA Angioneurotic edema, initial encounter: Secondary | ICD-10-CM | POA: Diagnosis not present

## 2020-02-11 DIAGNOSIS — E1122 Type 2 diabetes mellitus with diabetic chronic kidney disease: Secondary | ICD-10-CM | POA: Diagnosis present

## 2020-02-11 DIAGNOSIS — Z6833 Body mass index (BMI) 33.0-33.9, adult: Secondary | ICD-10-CM

## 2020-02-11 DIAGNOSIS — E669 Obesity, unspecified: Secondary | ICD-10-CM

## 2020-02-11 DIAGNOSIS — R0602 Shortness of breath: Secondary | ICD-10-CM

## 2020-02-11 DIAGNOSIS — Z79890 Hormone replacement therapy (postmenopausal): Secondary | ICD-10-CM

## 2020-02-11 DIAGNOSIS — Z79899 Other long term (current) drug therapy: Secondary | ICD-10-CM

## 2020-02-11 DIAGNOSIS — I4891 Unspecified atrial fibrillation: Secondary | ICD-10-CM | POA: Diagnosis present

## 2020-02-11 DIAGNOSIS — Z20822 Contact with and (suspected) exposure to covid-19: Secondary | ICD-10-CM | POA: Diagnosis present

## 2020-02-11 DIAGNOSIS — Z9114 Patient's other noncompliance with medication regimen: Secondary | ICD-10-CM

## 2020-02-11 DIAGNOSIS — J9621 Acute and chronic respiratory failure with hypoxia: Secondary | ICD-10-CM | POA: Diagnosis present

## 2020-02-11 DIAGNOSIS — I471 Supraventricular tachycardia: Secondary | ICD-10-CM | POA: Diagnosis present

## 2020-02-11 DIAGNOSIS — M109 Gout, unspecified: Secondary | ICD-10-CM | POA: Diagnosis present

## 2020-02-11 DIAGNOSIS — R319 Hematuria, unspecified: Secondary | ICD-10-CM | POA: Diagnosis not present

## 2020-02-11 DIAGNOSIS — Z9119 Patient's noncompliance with other medical treatment and regimen: Secondary | ICD-10-CM

## 2020-02-11 DIAGNOSIS — E785 Hyperlipidemia, unspecified: Secondary | ICD-10-CM | POA: Diagnosis present

## 2020-02-11 DIAGNOSIS — I5023 Acute on chronic systolic (congestive) heart failure: Secondary | ICD-10-CM

## 2020-02-11 DIAGNOSIS — R001 Bradycardia, unspecified: Secondary | ICD-10-CM | POA: Diagnosis not present

## 2020-02-11 DIAGNOSIS — I10 Essential (primary) hypertension: Secondary | ICD-10-CM | POA: Diagnosis not present

## 2020-02-11 DIAGNOSIS — I130 Hypertensive heart and chronic kidney disease with heart failure and stage 1 through stage 4 chronic kidney disease, or unspecified chronic kidney disease: Secondary | ICD-10-CM | POA: Diagnosis not present

## 2020-02-11 DIAGNOSIS — I428 Other cardiomyopathies: Secondary | ICD-10-CM | POA: Diagnosis present

## 2020-02-11 DIAGNOSIS — E873 Alkalosis: Secondary | ICD-10-CM | POA: Diagnosis present

## 2020-02-11 DIAGNOSIS — D509 Iron deficiency anemia, unspecified: Secondary | ICD-10-CM | POA: Diagnosis present

## 2020-02-11 DIAGNOSIS — Z96642 Presence of left artificial hip joint: Secondary | ICD-10-CM | POA: Diagnosis present

## 2020-02-11 DIAGNOSIS — T502X5A Adverse effect of carbonic-anhydrase inhibitors, benzothiadiazides and other diuretics, initial encounter: Secondary | ICD-10-CM | POA: Diagnosis not present

## 2020-02-11 DIAGNOSIS — I2510 Atherosclerotic heart disease of native coronary artery without angina pectoris: Secondary | ICD-10-CM | POA: Diagnosis present

## 2020-02-11 DIAGNOSIS — J449 Chronic obstructive pulmonary disease, unspecified: Secondary | ICD-10-CM | POA: Diagnosis present

## 2020-02-11 DIAGNOSIS — M1990 Unspecified osteoarthritis, unspecified site: Secondary | ICD-10-CM | POA: Diagnosis present

## 2020-02-11 DIAGNOSIS — T50901A Poisoning by unspecified drugs, medicaments and biological substances, accidental (unintentional), initial encounter: Secondary | ICD-10-CM | POA: Diagnosis present

## 2020-02-11 DIAGNOSIS — Z95810 Presence of automatic (implantable) cardiac defibrillator: Secondary | ICD-10-CM

## 2020-02-11 DIAGNOSIS — Z7901 Long term (current) use of anticoagulants: Secondary | ICD-10-CM

## 2020-02-11 DIAGNOSIS — N289 Disorder of kidney and ureter, unspecified: Secondary | ICD-10-CM | POA: Diagnosis present

## 2020-02-11 DIAGNOSIS — Z8249 Family history of ischemic heart disease and other diseases of the circulatory system: Secondary | ICD-10-CM

## 2020-02-11 DIAGNOSIS — N179 Acute kidney failure, unspecified: Secondary | ICD-10-CM | POA: Diagnosis present

## 2020-02-11 DIAGNOSIS — E039 Hypothyroidism, unspecified: Secondary | ICD-10-CM | POA: Diagnosis present

## 2020-02-11 DIAGNOSIS — Z87442 Personal history of urinary calculi: Secondary | ICD-10-CM

## 2020-02-11 DIAGNOSIS — Z86711 Personal history of pulmonary embolism: Secondary | ICD-10-CM

## 2020-02-11 DIAGNOSIS — D649 Anemia, unspecified: Secondary | ICD-10-CM | POA: Diagnosis present

## 2020-02-11 DIAGNOSIS — N390 Urinary tract infection, site not specified: Secondary | ICD-10-CM | POA: Diagnosis present

## 2020-02-11 DIAGNOSIS — I480 Paroxysmal atrial fibrillation: Secondary | ICD-10-CM | POA: Diagnosis present

## 2020-02-11 DIAGNOSIS — Z6836 Body mass index (BMI) 36.0-36.9, adult: Secondary | ICD-10-CM

## 2020-02-11 DIAGNOSIS — F329 Major depressive disorder, single episode, unspecified: Secondary | ICD-10-CM | POA: Diagnosis present

## 2020-02-11 DIAGNOSIS — B9620 Unspecified Escherichia coli [E. coli] as the cause of diseases classified elsewhere: Secondary | ICD-10-CM | POA: Diagnosis present

## 2020-02-11 DIAGNOSIS — Z888 Allergy status to other drugs, medicaments and biological substances status: Secondary | ICD-10-CM

## 2020-02-11 LAB — BASIC METABOLIC PANEL
Anion gap: 11 (ref 5–15)
BUN: 56 mg/dL — ABNORMAL HIGH (ref 8–23)
CO2: 20 mmol/L — ABNORMAL LOW (ref 22–32)
Calcium: 8.9 mg/dL (ref 8.9–10.3)
Chloride: 107 mmol/L (ref 98–111)
Creatinine, Ser: 1.62 mg/dL — ABNORMAL HIGH (ref 0.61–1.24)
GFR calc Af Amer: 45 mL/min — ABNORMAL LOW (ref >60)
GFR calc non Af Amer: 39 mL/min — ABNORMAL LOW (ref >60)
Glucose, Bld: 93 mg/dL (ref 70–99)
Potassium: 4.8 mmol/L (ref 3.5–5.1)
Sodium: 138 mmol/L (ref 135–145)

## 2020-02-11 LAB — CBC
HCT: 28.6 % — ABNORMAL LOW (ref 39.0–52.0)
Hemoglobin: 8.8 g/dL — ABNORMAL LOW (ref 13.0–17.0)
MCH: 28.5 pg (ref 26.0–34.0)
MCHC: 30.8 g/dL (ref 30.0–36.0)
MCV: 92.6 fL (ref 80.0–100.0)
Platelets: 267 10*3/uL (ref 150–400)
RBC: 3.09 MIL/uL — ABNORMAL LOW (ref 4.22–5.81)
RDW: 16.1 % — ABNORMAL HIGH (ref 11.5–15.5)
WBC: 7.4 10*3/uL (ref 4.0–10.5)
nRBC: 0 % (ref 0.0–0.2)

## 2020-02-11 LAB — PROTIME-INR
INR: 1.2 (ref 0.8–1.2)
Prothrombin Time: 15.5 seconds — ABNORMAL HIGH (ref 11.4–15.2)

## 2020-02-11 LAB — TROPONIN I (HIGH SENSITIVITY)
Troponin I (High Sensitivity): 116 ng/L (ref <18)
Troponin I (High Sensitivity): 103 ng/L (ref <18)

## 2020-02-11 LAB — CBG MONITORING, ED: Glucose-Capillary: 177 mg/dL — ABNORMAL HIGH (ref 70–99)

## 2020-02-11 LAB — BRAIN NATRIURETIC PEPTIDE: B Natriuretic Peptide: 1569 pg/mL — ABNORMAL HIGH (ref 0.0–100.0)

## 2020-02-11 LAB — HEMOGLOBIN A1C
Hgb A1c MFr Bld: 5.7 % — ABNORMAL HIGH (ref 4.8–5.6)
Mean Plasma Glucose: 116.89 mg/dL

## 2020-02-11 MED ORDER — SPIRONOLACTONE 25 MG PO TABS
25.0000 mg | ORAL_TABLET | Freq: Every day | ORAL | Status: DC
  Administered 2020-02-12 – 2020-02-16 (×5): 25 mg via ORAL
  Filled 2020-02-11 (×5): qty 1

## 2020-02-11 MED ORDER — IPRATROPIUM-ALBUTEROL 0.5-2.5 (3) MG/3ML IN SOLN: 3.0000 mL | RESPIRATORY_TRACT | Status: DC | PRN

## 2020-02-11 MED ORDER — ISOSORBIDE MONONITRATE ER 60 MG PO TB24
90.0000 mg | ORAL_TABLET | Freq: Every day | ORAL | Status: DC
  Administered 2020-02-12 – 2020-02-14 (×3): 90 mg via ORAL
  Filled 2020-02-11 (×3): qty 1

## 2020-02-11 MED ORDER — VITAMIN B-12 1000 MCG PO TABS
1000.0000 ug | ORAL_TABLET | Freq: Every day | ORAL | Status: DC
  Administered 2020-02-12 – 2020-02-24 (×13): 1000 ug via ORAL
  Filled 2020-02-11 (×13): qty 1

## 2020-02-11 MED ORDER — METHOCARBAMOL 500 MG PO TABS: 500.0000 mg | ORAL_TABLET | Freq: Four times a day (QID) | ORAL | Status: DC | PRN

## 2020-02-11 MED ORDER — TAMSULOSIN HCL 0.4 MG PO CAPS
0.4000 mg | ORAL_CAPSULE | Freq: Every day | ORAL | Status: DC
  Administered 2020-02-11 – 2020-02-23 (×13): 0.4 mg via ORAL
  Filled 2020-02-11 (×13): qty 1

## 2020-02-11 MED ORDER — ALLOPURINOL 100 MG PO TABS
200.0000 mg | ORAL_TABLET | Freq: Every day | ORAL | Status: DC
  Administered 2020-02-12 – 2020-02-24 (×13): 200 mg via ORAL
  Filled 2020-02-11 (×13): qty 2

## 2020-02-11 MED ORDER — FINASTERIDE 5 MG PO TABS
5.0000 mg | ORAL_TABLET | Freq: Every day | ORAL | Status: DC
  Administered 2020-02-12 – 2020-02-24 (×13): 5 mg via ORAL
  Filled 2020-02-11 (×13): qty 1

## 2020-02-11 MED ORDER — SODIUM CHLORIDE 0.9% FLUSH: 3.0000 mL | INTRAVENOUS | Status: DC | PRN

## 2020-02-11 MED ORDER — APIXABAN 2.5 MG PO TABS
2.5000 mg | ORAL_TABLET | Freq: Two times a day (BID) | ORAL | Status: DC
  Administered 2020-02-11 – 2020-02-15 (×8): 2.5 mg via ORAL
  Filled 2020-02-11 (×9): qty 1

## 2020-02-11 MED ORDER — LEVOTHYROXINE SODIUM 25 MCG PO TABS
125.0000 ug | ORAL_TABLET | Freq: Every day | ORAL | Status: DC
  Administered 2020-02-12 – 2020-02-24 (×13): 125 ug via ORAL
  Filled 2020-02-11 (×13): qty 1

## 2020-02-11 MED ORDER — INSULIN ASPART 100 UNIT/ML ~~LOC~~ SOLN: 0.0000 [IU] | Freq: Every day | SUBCUTANEOUS | Status: DC

## 2020-02-11 MED ORDER — ACETAMINOPHEN-CODEINE 120-12 MG/5ML PO SOLN
2.5000 mL | Freq: Once | ORAL | Status: AC
  Administered 2020-02-11: 2.5 mL via ORAL
  Filled 2020-02-11: qty 5

## 2020-02-11 MED ORDER — FUROSEMIDE 10 MG/ML IJ SOLN
40.0000 mg | Freq: Once | INTRAMUSCULAR | Status: AC
  Administered 2020-02-11: 40 mg via INTRAVENOUS
  Filled 2020-02-11: qty 4

## 2020-02-11 MED ORDER — MAGNESIUM OXIDE 400 (241.3 MG) MG PO TABS
400.0000 mg | ORAL_TABLET | Freq: Every day | ORAL | Status: DC
  Administered 2020-02-12 – 2020-02-24 (×13): 400 mg via ORAL
  Filled 2020-02-11 (×13): qty 1

## 2020-02-11 MED ORDER — FUROSEMIDE 10 MG/ML IJ SOLN
60.0000 mg | Freq: Two times a day (BID) | INTRAMUSCULAR | Status: DC
  Administered 2020-02-12: 60 mg via INTRAVENOUS
  Filled 2020-02-11: qty 6

## 2020-02-11 MED ORDER — DOCUSATE SODIUM 100 MG PO CAPS
100.0000 mg | ORAL_CAPSULE | Freq: Two times a day (BID) | ORAL | Status: DC
  Administered 2020-02-11 – 2020-02-20 (×16): 100 mg via ORAL
  Filled 2020-02-11 (×18): qty 1

## 2020-02-11 MED ORDER — ONDANSETRON HCL 4 MG/2ML IJ SOLN: 4.0000 mg | Freq: Four times a day (QID) | INTRAMUSCULAR | Status: DC | PRN

## 2020-02-11 MED ORDER — SERTRALINE HCL 50 MG PO TABS
25.0000 mg | ORAL_TABLET | Freq: Every day | ORAL | Status: DC
  Administered 2020-02-12 – 2020-02-24 (×13): 25 mg via ORAL
  Filled 2020-02-11 (×12): qty 1

## 2020-02-11 MED ORDER — ROSUVASTATIN CALCIUM 5 MG PO TABS
10.0000 mg | ORAL_TABLET | Freq: Every day | ORAL | Status: DC
  Administered 2020-02-12 – 2020-02-23 (×12): 10 mg via ORAL
  Filled 2020-02-11 (×12): qty 2

## 2020-02-11 MED ORDER — HYDROCODONE-ACETAMINOPHEN 5-325 MG PO TABS
2.0000 | ORAL_TABLET | ORAL | Status: DC | PRN
  Administered 2020-02-13 – 2020-02-21 (×8): 2 via ORAL
  Filled 2020-02-11 (×8): qty 2

## 2020-02-11 MED ORDER — DICLOFENAC SODIUM 1 % EX GEL
4.0000 g | Freq: Four times a day (QID) | CUTANEOUS | Status: DC
  Administered 2020-02-12 – 2020-02-24 (×18): 4 g via TOPICAL
  Filled 2020-02-11 (×3): qty 100

## 2020-02-11 MED ORDER — DICLOFENAC SODIUM 1 % TD GEL
4.0000 g | Freq: Four times a day (QID) | TRANSDERMAL | Status: DC
  Filled 2020-02-11: qty 100

## 2020-02-11 MED ORDER — GUAIFENESIN-CODEINE 100-10 MG/5ML PO SOLN
5.0000 mL | Freq: Four times a day (QID) | ORAL | Status: DC | PRN
  Administered 2020-02-12 – 2020-02-24 (×15): 5 mL via ORAL
  Filled 2020-02-11 (×15): qty 5

## 2020-02-11 MED ORDER — SODIUM CHLORIDE 0.9 % IV SOLN: 250.0000 mL | INTRAVENOUS | Status: DC | PRN

## 2020-02-11 MED ORDER — ACETAMINOPHEN-CODEINE 120-12 MG/5ML PO SOLN: 5.0000 mL | Freq: Once | ORAL | Status: DC

## 2020-02-11 MED ORDER — CARVEDILOL 3.125 MG PO TABS
3.1250 mg | ORAL_TABLET | Freq: Two times a day (BID) | ORAL | Status: DC
  Administered 2020-02-12 – 2020-02-16 (×7): 3.125 mg via ORAL
  Filled 2020-02-11 (×9): qty 1

## 2020-02-11 MED ORDER — BENZONATATE 100 MG PO CAPS: 100.0000 mg | ORAL_CAPSULE | Freq: Once | ORAL | Status: DC

## 2020-02-11 MED ORDER — ACETAMINOPHEN 325 MG PO TABS: 650.0000 mg | ORAL_TABLET | ORAL | Status: DC | PRN

## 2020-02-11 MED ORDER — INSULIN ASPART 100 UNIT/ML ~~LOC~~ SOLN
0.0000 [IU] | Freq: Three times a day (TID) | SUBCUTANEOUS | Status: DC
  Administered 2020-02-12: 3 [IU] via SUBCUTANEOUS
  Administered 2020-02-13 – 2020-02-16 (×5): 2 [IU] via SUBCUTANEOUS
  Administered 2020-02-17: 3 [IU] via SUBCUTANEOUS
  Administered 2020-02-18 – 2020-02-19 (×3): 2 [IU] via SUBCUTANEOUS
  Administered 2020-02-20: 3 [IU] via SUBCUTANEOUS
  Administered 2020-02-20: 2 [IU] via SUBCUTANEOUS
  Administered 2020-02-20: 3 [IU] via SUBCUTANEOUS
  Administered 2020-02-21 (×2): 2 [IU] via SUBCUTANEOUS
  Administered 2020-02-22: 3 [IU] via SUBCUTANEOUS
  Administered 2020-02-22: 2 [IU] via SUBCUTANEOUS
  Administered 2020-02-23 – 2020-02-24 (×3): 3 [IU] via SUBCUTANEOUS

## 2020-02-11 MED ORDER — ACETAMINOPHEN-CODEINE 120-12 MG/5ML PO SOLN: 2.5000 mL | Freq: Once | ORAL | Status: DC

## 2020-02-11 MED ORDER — HYDRALAZINE HCL 50 MG PO TABS
75.0000 mg | ORAL_TABLET | Freq: Three times a day (TID) | ORAL | Status: DC
  Administered 2020-02-11 – 2020-02-13 (×6): 75 mg via ORAL
  Filled 2020-02-11: qty 1
  Filled 2020-02-11: qty 3
  Filled 2020-02-11 (×6): qty 1

## 2020-02-11 MED ORDER — SODIUM CHLORIDE 0.9% FLUSH
3.0000 mL | Freq: Two times a day (BID) | INTRAVENOUS | Status: DC
  Administered 2020-02-11 – 2020-02-23 (×18): 3 mL via INTRAVENOUS

## 2020-02-11 NOTE — ED Triage Notes (Signed)
Pt from home with ems for CHF exacerbation. Pt mistakingly been taking the wrong medications. Pt coughing in triage. Pt a.o resp e.u VSS

## 2020-02-11 NOTE — Progress Notes (Signed)
Paramedicine Encounter    Patient ID: Ryan Macdonald, male    DOB: [birth year], 86 y.o.   MRN: [id number]   Patient Care Team: [facility] as PCP - General (General Practice) [name], MD as PCP - Cardiology (Cardiology) Laurey Morale, MD as PCP - Advanced Heart Failure (Cardiology)  Patient Active Problem List   Diagnosis Date Noted  . Secondary hypercoagulable state (HCC) 01/16/2020  . AKI (acute kidney injury) (HCC) 03/14/2019  . Spasm of muscle of lower back 03/14/2019  . Acute renal failure (ARF) (HCC) 03/09/2019  . Transaminitis   . Acute on chronic renal failure (HCC) 03/08/2019  . Acute exacerbation of CHF (congestive heart failure) (HCC) 11/23/2018  . Acute on chronic heart failure (HCC) 10/19/2018  . Pulmonary emphysema (HCC)   . Chronic respiratory failure with hypoxia (HCC)   . Morbid obesity (HCC) 09/09/2018  . URI (upper respiratory infection) 08/31/2018  . UTI (urinary tract infection) 08/31/2018  . Chronic systolic (congestive) heart failure (HCC) 08/28/2018  . CHF (congestive heart failure) (HCC) 08/14/2018  . Acute on chronic combined systolic and diastolic CHF (congestive heart failure) (HCC) 07/18/2018  . First degree AV block   . Shortness of breath 04/29/2018  . Acute HFrEF (heart failure with reduced ejection fraction) (HCC) 04/29/2018  . Acute respiratory failure with hypoxia (HCC) 09/27/2017  . CHF exacerbation (HCC) 09/27/2017  . Renal insufficiency   . Acute on chronic systolic CHF (congestive heart failure) (HCC) 07/08/2017  . Left knee pain 07/08/2017  . Acute bronchitis 05/10/2017  . Acute congestive heart failure (HCC) 04/14/2017  . Elevated troponin 04/14/2017  . Lactic acidosis   . Acute on chronic systolic heart failure, NYHA class 2 (HCC) 02/03/2017  . Diabetes mellitus type 2 in obese (HCC) 02/03/2017  . CAP (community acquired pneumonia) 02/03/2017  . Lobar pneumonia (HCC)   . Diabetes mellitus with complication  (HCC)   . Acute on chronic respiratory failure with hypoxia (HCC)   . Acute on chronic combined systolic and diastolic heart failure (HCC) 05/19/2016  . Depression 05/19/2016  . Gout 05/19/2016  . 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 NSVT (nonsustained ventricular tachycardia) (HCC)   . Chronic renal insufficiency, stage 3 (moderate) (HCC)   . Hyperkalemia 08/21/2015  . Pulmonary embolism (HCC)   . Acute respiratory failure (HCC)   . Osteoarthritis of left hip 10/25/2013  . Atrial fibrillation (HCC) 10/25/2013  . S/P hip replacement 10/24/2013  . Nonischemic cardiomyopathy (HCC)   . Obesity (BMI 30-39.9)   . Essential hypertension   . Hyperlipidemia     Current Outpatient Medications:  .  allopurinol (ZYLOPRIM) 100 MG tablet, Take 200 mg by mouth daily. , Disp: , Rfl:  .  apixaban (ELIQUIS) 2.5 MG TABS tablet, Take 1 tablet (2.5 mg total) by mouth 2 (two) times daily., Disp: 90 tablet, Rfl: 0 .  carvedilol (COREG) 3.125 MG tablet, Take 1 tablet (3.125 mg total) by mouth 2 (two) times daily with a meal., Disp: 60 tablet, Rfl: 3 .  cyanocobalamin 1000 MCG tablet, Take 1 tablet (1,000 mcg total) by mouth daily., Disp: 90 tablet, Rfl: 0 .  diclofenac sodium (VOLTAREN) 1 % GEL, Apply 4 g topically 4 (four) times daily., Disp: 1 Tube, Rfl: 0 .  docusate sodium (COLACE) 100 MG capsule, Take 100 mg by mouth 2 (two) times daily., Disp: , Rfl:  .  finasteride (PROSCAR) 5 MG tablet, Take 1 tablet (5 mg total) by mouth daily., Disp: 30  tablet, Rfl: 0 .  glipiZIDE (GLUCOTROL) 5 MG tablet, Take 5 mg by mouth daily before breakfast., Disp: , Rfl:  .  hydrALAZINE (APRESOLINE) 25 MG tablet, Take 75 mg by mouth 3 (three) times daily., Disp: , Rfl:  .  hydrALAZINE (APRESOLINE) 50 MG tablet, Take 1.5 tablets (75 mg total) by mouth 3 (three) times daily., Disp: 405 tablet, Rfl: 3 .  HYDROcodone-acetaminophen (NORCO/VICODIN) 5-325 MG tablet, Take 2 tablets by mouth every 4  (four) hours as needed., Disp: 6 tablet, Rfl: 0 .  ipratropium-albuterol (DUONEB) 0.5-2.5 (3) MG/3ML SOLN, Take 3 mLs by nebulization every 4 (four) hours as needed. (Patient taking differently: Take 3 mLs by nebulization every 4 (four) hours as needed (wheezing/shortness of breath). ), Disp: 360 mL, Rfl: 0 .  isosorbide mononitrate (IMDUR) 30 MG 24 hr tablet, Take 90 mg by mouth daily., Disp: , Rfl:  .  isosorbide mononitrate (IMDUR) 60 MG 24 hr tablet, Take 1.5 tablets (90 mg total) by mouth daily., Disp: 135 tablet, Rfl: 3 .  ketoconazole (NIZORAL) 2 % shampoo, Apply 1 application topically 2 (two) times a week., Disp: , Rfl:  .  levothyroxine (SYNTHROID) 125 MCG tablet, Take 1 tablet (125 mcg total) by mouth daily before breakfast., Disp: 90 tablet, Rfl: 1 .  magnesium oxide (MAG-OX) 400 MG tablet, Take 400 mg by mouth daily., Disp: , Rfl:  .  methocarbamol (ROBAXIN) 500 MG tablet, Take 1 tablet (500 mg total) by mouth every 6 (six) hours as needed for muscle spasms., Disp: 30 tablet, Rfl: 0 .  rosuvastatin (CRESTOR) 10 MG tablet, Take 1 tablet (10 mg total) by mouth daily at 6 PM., Disp: 30 tablet, Rfl: 5 .  sertraline (ZOLOFT) 25 MG tablet, Take 25 mg by mouth daily. , Disp: , Rfl:  .  spironolactone (ALDACTONE) 25 MG tablet, Take 1 tablet (25 mg total) by mouth daily., Disp: 30 tablet, Rfl: 6 .  tamsulosin (FLOMAX) 0.4 MG CAPS capsule, Take 0.4 mg by mouth at bedtime., Disp: , Rfl:  .  torsemide (DEMADEX) 20 MG tablet, Take 3 tablets (60 mg total) by mouth daily., Disp: 270 tablet, Rfl: 3 .  nitroGLYCERIN (NITROSTAT) 0.4 MG SL tablet, Place 1 tablet (0.4 mg total) under the tongue every 5 (five) minutes x 3 doses as needed for chest pain. (Patient not taking: Reported on 02/04/2020), Disp: 25 tablet, Rfl: 0 Allergies  Allergen Reactions  . Lisinopril Swelling    Facial swelling  . Xarelto [Rivaroxaban] Other (See Comments)    "Blood came out of my penis"      Social History    Socioeconomic History  . Marital status: Married    Spouse name: Not on file  . Number of children: Not on file  . Years of education: Not on file  . Highest education level: Not on file  Occupational History  . Occupation: Retired    Comment: Former Corporate treasurer  Tobacco Use  . Smoking status: Former Smoker    Packs/day: 1.00    Years: 15.00    Pack years: 15.00    Types: Cigarettes, Cigars  . Smokeless tobacco: Former Systems developer  . Tobacco comment: quit smoking ~ 50 yr ago  Substance and Sexual Activity  . Alcohol use: No    Alcohol/week: 2.0 standard drinks    Types: 2 Cans of beer per week    Comment: rare beer.  . Drug use: No  . Sexual activity: Yes  Other Topics Concern  . Not on file  Social History Narrative   Lives in [place] with wife. Moved from [place] (2013). Does not routinely exercise.   Social Determinants of Health   Financial Resource Strain:   . Difficulty of Paying Living Expenses:   Food Insecurity:   . Worried About Programme researcher, broadcasting/film/video in the Last Year:   . Barista in the Last Year:   Transportation Needs:   . Freight forwarder (Medical):   Marland Kitchen Lack of Transportation (Non-Medical):   Physical Activity:   . Days of Exercise per Week:   . Minutes of Exercise per Session:   Stress:   . Feeling of Stress :   Social Connections:   . Frequency of Communication with Friends and Family:   . Frequency of Social Gatherings with Friends and Family:   . Attends Religious Services:   . Active Member of Clubs or Organizations:   . Attends Banker Meetings:   Marland Kitchen Marital Status:   Intimate Partner Violence:   . Fear of Current or Ex-Partner:   . Emotionally Abused:   Marland Kitchen Physically Abused:   . Sexually Abused:     Physical Exam      Future Appointments  Date Time Provider Department Center  02/13/2020  2:30 PM Felecia Shelling, DPM [facility]  02/18/2020 11:30 AM [facility] LAB [facility]  03/21/2020  7:35 AM  [facility] DEVICE REMOTES [facility]  03/27/2020 11:00 AM [facility] ECHO OP 1 [facility]  03/27/2020 12:00 PM Laurey Morale, MD [facility] None  06/20/2020  7:35 AM [facility] DEVICE REMOTES [facility]  09/19/2020  7:35 AM [facility] DEVICE REMOTES [facility]    BP 110/72   Pulse 100   Temp 98.9 F (37.2 C)   Resp 18   Wt 229 lb (103.9 kg)   SpO2 98%   BMI 35.87 kg/m   Weight yesterday-?? Last visit weight-202  Pt reports he hasnt been feeling well. He had to call 911 last night b/c he wasn't feel well and was having sob. He said medics reported his v/s were WNL, he decided to not go to hosp. He states this morning he feels alright. He does have a lot of edema to his legs up to his knees. His home aide was here and placed the compression stockings on his legs. He states he is urinating ok. I think pt is feeling worse than he is saying.  He has not been taking meds from the pill box that I filled last week-he apparently had found an old pill box that had some pills in it that he has been taking. I have no idea as to what dosages anything was in that pill box. That box had only the PM doses left in it, all the AM doses were gone. So I took out all those pills as well. So nothing has changed from last week with his weight or how he has been feeling. He had only taken 3 days out of the pill box I had filled for him.  Wife reports he has not been able to lay down flat to sleep and has been coughing and using his 02 more often. Spoke to clinic and due to his extreme amount of extra fluid they will not be able to do IV lasix in office for this so it was recommended he go to ER. I got an EMS unit to come txp him to hosp.  IV started in left forearm, other care done in EMS  unit.  I will f/u once he is d/c.    [name], EMT-Paramedic [phone] [facility] Paramedic  02/11/20

## 2020-02-11 NOTE — Telephone Encounter (Signed)
Late entry Katie w/paramedicine called to report pt had swelling up to his knees in both legs, pt c/o shortness of breath, weight up from last week 199lbs to 229lbs today.  Katie sent pt to the ED by EMS.

## 2020-02-11 NOTE — H&P (Signed)
History and Physical    Ryan Macdonald IOX:[id number] DOB: May 17, 1934 DOA: 02/11/2020  PCP: [facility]  Patient coming from: Home  I have personally briefly reviewed patient's old medical records in [facility]  Chief Complaint: CHF  HPI: Ryan Macdonald is a 86 y.o. male with medical history significant of NICM EF 20-25% on TEE last month, AICD placement, PAF on eliquis had DCCV last month, h/o PE, DM2.  Patient says that he recently realized that he thought that he had been taking a diuretic but had actually been taking the wrong medication.  He says that he has noticed worsening fluid in his legs over the past week.  Has increased cough, productive of clear sputum.  No CP, SOB, fevers, chills, N/V/D.  Wt is up to 229 lbs from 199lbs it seems.   ED Course: Pt with findings c/w CHF.  BNP 1569, trop 116, CXR with pulm vasc congestion,   Creat 1.6 up slightly from 1.4 last month.  Review of Systems: As per HPI, otherwise all review of systems negative.  Past Medical History:  Diagnosis Date  . AKI (acute kidney injury) (HCC) 02/2019  . Atrial fibrillation (HCC)   . Cardiomyopathy (HCC)   . Cat scratch fever    removed mass on right side neck  . Cataracts, bilateral   . CHF (congestive heart failure) (HCC)   . Chronic kidney disease   . Corns and callosities   . Diabetes mellitus without complication (HCC)    TYPE 2  . Dyspnea   . Erythema intertrigo   . Flat foot   . Gout   . High cholesterol   . Hx of urethral stricture   . Hypertension   . Kidney stones   . Morbid obesity (HCC)   . Nonischemic cardiomyopathy (HCC)    a. ? 2009 Cath in [place] - nl cors per pt;  b. 09/2013 Echo: EF 25-30%, sev diff HK.  . Obesity   . Osteoarthritis   . PAF (paroxysmal atrial fibrillation) (HCC)    a. post-op hip in 2014 - prev on xarelto.  . Renal insufficiency     Past Surgical History:  Procedure Laterality Date  . CARDIAC CATHETERIZATION  1980's  .  CARDIOVERSION N/A 01/15/2020   Procedure: CARDIOVERSION;  Surgeon: Laurey Morale, MD;  Location: [facility] ENDOSCOPY;  Service: Cardiovascular;  Laterality: N/A;  . CIRCUMCISION  1940's  . CORONARY STENT INTERVENTION N/A 08/18/2018   Procedure: CORONARY STENT INTERVENTION;  Surgeon: Corky Crafts, MD;  Location: [facility] INVASIVE CV LAB;  Service: Cardiovascular;  Laterality: N/A;  . CYSTOSCOPY WITH URETHRAL DILATATION  10/23/2013  . CYSTOSCOPY WITH URETHRAL DILATATION N/A 10/23/2013   Procedure: CYSTOSCOPY WITH URETHRAL DILATATION;  Surgeon: Kathi Ludwig, MD;  Location: [facility] OR;  Service: Urology;  Laterality: N/A;  . INCISION AND DRAINAGE OF WOUND Right 1980's   "cat scratch" (10/23/2013)  . INGUINAL HERNIA REPAIR Right 1950's  . INTRAVASCULAR ULTRASOUND/IVUS N/A 08/18/2018   Procedure: Intravascular Ultrasound/IVUS;  Surgeon: Corky Crafts, MD;  Location: [facility] INVASIVE CV LAB;  Service: Cardiovascular;  Laterality: N/A;  . KIDNEY STONE SURGERY Right 1970's; 1983   "twice"  . LEFT HEART CATH AND CORONARY ANGIOGRAPHY N/A 08/18/2018   Procedure: LEFT HEART CATH AND CORONARY ANGIOGRAPHY;  Surgeon: Corky Crafts, MD;  Location: [facility] INVASIVE CV LAB;  Service: Cardiovascular;  Laterality: N/A;  . TEE WITHOUT CARDIOVERSION N/A 01/15/2020   Procedure: TRANSESOPHAGEAL ECHOCARDIOGRAM (TEE);  Surgeon: Laurey Morale, MD;  [facility] ENDOSCOPY;  Service: Cardiovascular;  Laterality: N/A;  . TONSILLECTOMY AND ADENOIDECTOMY  1940's  . TOTAL HIP ARTHROPLASTY Left 10/23/2013   Procedure: TOTAL HIP ARTHROPLASTY;  Surgeon: Valeria Batman, MD;  Location: [facility] OR;  Service: Orthopedics;  Laterality: Left;     reports that he has quit smoking. His smoking use included cigarettes and cigars. He has a 15.00 pack-year smoking history. He has quit using smokeless tobacco. He reports that he does not drink alcohol or use drugs.  Allergies  Allergen Reactions  . Lisinopril Swelling    Facial  swelling  . Xarelto [Rivaroxaban] Other (See Comments)    "Blood came out of my penis"    Family History  Problem Relation Age of Onset  . Heart disease Mother   . Heart Problems Maternal Grandmother   . Other Other        negative for premature CAD     Prior to Admission medications   Medication Sig Start Date End Date Taking? Authorizing Provider  allopurinol (ZYLOPRIM) 100 MG tablet Take 200 mg by mouth daily.    Yes [provider]  apixaban (ELIQUIS) 2.5 MG TABS tablet Take 1 tablet (2.5 mg total) by mouth 2 (two) times daily. 05/04/18  Yes Angelita Ingles, MD  carvedilol (COREG) 3.125 MG tablet Take 1 tablet (3.125 mg total) by mouth 2 (two) times daily with a meal. 02/01/20  Yes Fenton, Clint R, PA  cyanocobalamin 1000 MCG tablet Take 1 tablet (1,000 mcg total) by mouth daily. 03/13/19  Yes Laurey Morale, MD  diclofenac sodium (VOLTAREN) 1 % GEL Apply 4 g topically 4 (four) times daily. 07/10/17  Yes Valentino Nose, MD  docusate sodium (COLACE) 100 MG capsule Take 100 mg by mouth 2 (two) times daily.   Yes [provider]  finasteride (PROSCAR) 5 MG tablet Take 1 tablet (5 mg total) by mouth daily. 11/29/18  Yes Alford Highland, NP  glipiZIDE (GLUCOTROL) 5 MG tablet Take 5 mg by mouth daily before breakfast.   Yes [provider]  hydrALAZINE (APRESOLINE) 50 MG tablet Take 1.5 tablets (75 mg total) by mouth 3 (three) times daily. 12/20/19  Yes Laurey Morale, MD  HYDROcodone-acetaminophen (NORCO/VICODIN) 5-325 MG tablet Take 2 tablets by mouth every 4 (four) hours as needed. 04/09/16  Yes Isa Rankin, MD  ipratropium-albuterol (DUONEB) 0.5-2.5 (3) MG/3ML SOLN Take 3 mLs by nebulization every 4 (four) hours as needed. Patient taking differently: Take 3 mLs by nebulization every 4 (four) hours as needed (wheezing/shortness of breath).  05/14/17  Yes Pearson Grippe, MD  isosorbide mononitrate (IMDUR) 30 MG 24 hr tablet Take 90 mg by mouth daily.   Yes [provider]  isosorbide mononitrate (IMDUR) 60 MG 24 hr tablet Take 1.5 tablets (90 mg total) by mouth daily. 12/20/19  Yes Laurey Morale, MD  levothyroxine (SYNTHROID) 125 MCG tablet Take 1 tablet (125 mcg total) by mouth daily before breakfast. 08/01/19  Yes Carlus Pavlov, MD  magnesium oxide (MAG-OX) 400 MG tablet Take 400 mg by mouth daily.   Yes [provider]  methocarbamol (ROBAXIN) 500 MG tablet Take 1 tablet (500 mg total) by mouth every 6 (six) hours as needed for muscle spasms. 03/15/19  Yes Masoudi, Elhamalsadat, MD  rosuvastatin (CRESTOR) 10 MG tablet Take 1 tablet (10 mg total) by mouth daily at 6 PM. 12/22/18  Yes [name], MD  sertraline (ZOLOFT) 25 MG tablet Take 25 mg by mouth daily.  Yes [provider]  spironolactone (ALDACTONE) 25 MG tablet Take 1 tablet (25 mg total) by mouth daily. 04/13/19  Yes Bensimhon, Shaune Pascal, MD  tamsulosin (FLOMAX) 0.4 MG CAPS capsule Take 0.4 mg by mouth at bedtime.   Yes [provider]  torsemide (DEMADEX) 20 MG tablet Take 3 tablets (60 mg total) by mouth daily. 02/04/20  Yes Deboraha Sprang, MD    Physical Exam: Vitals:   02/11/20 1915 02/11/20 1930 02/11/20 1945 02/11/20 2000  BP: 118/85 132/87 119/84 121/75  Pulse: 100 100 (!) 103 95  Resp:  (!) 26 (!) 29 (!) 35  Temp:      TempSrc:      SpO2: 96% 96% 94% 95%  Weight:      Height:        Constitutional: NAD, calm, comfortable Eyes: PERRL, lids and conjunctivae normal ENMT: Mucous membranes are moist. Posterior pharynx clear of any exudate or lesions.Normal dentition.  Neck: normal, supple, no masses, no thyromegaly Respiratory: B rhonchi Cardiovascular: JVD, BLE edema present Abdomen: no tenderness, no masses palpated. No hepatosplenomegaly. Bowel sounds positive.  Musculoskeletal: no clubbing / cyanosis. No joint deformity upper and lower extremities. Good ROM, no contractures. Normal muscle tone.  Skin: no rashes, lesions, ulcers. No  induration Neurologic: CN 2-12 grossly intact. Sensation intact, DTR normal. Strength 5/5 in all 4.  Psychiatric: Normal judgment and insight. Alert and oriented x 3. Normal mood.    Labs on Admission: I have personally reviewed following labs and imaging studies  CBC: Recent Labs  Lab 02/11/20 1424  WBC 7.4  HGB 8.8*  HCT 28.6*  MCV 92.6  PLT 034   Basic Metabolic Panel: Recent Labs  Lab 02/11/20 1424  NA 138  K 4.8  CL 107  CO2 20*  GLUCOSE 93  BUN 56*  CREATININE 1.62*  CALCIUM 8.9   GFR: Estimated Creatinine Clearance: 39.7 mL/min (A) (by C-G formula based on SCr of 1.62 mg/dL (H)). Liver Function Tests: No results for input(s): AST, ALT, ALKPHOS, BILITOT, PROT, ALBUMIN in the last 168 hours. No results for input(s): LIPASE, AMYLASE in the last 168 hours. No results for input(s): AMMONIA in the last 168 hours. Coagulation Profile: Recent Labs  Lab 02/11/20 1424  INR 1.2   Cardiac Enzymes: No results for input(s): CKTOTAL, CKMB, CKMBINDEX, TROPONINI in the last 168 hours. BNP (last 3 results) No results for input(s): PROBNP in the last 8760 hours. HbA1C: No results for input(s): HGBA1C in the last 72 hours. CBG: No results for input(s): GLUCAP in the last 168 hours. Lipid Profile: No results for input(s): CHOL, HDL, LDLCALC, TRIG, CHOLHDL, LDLDIRECT in the last 72 hours. Thyroid Function Tests: No results for input(s): TSH, T4TOTAL, FREET4, T3FREE, THYROIDAB in the last 72 hours. Anemia Panel: No results for input(s): VITAMINB12, FOLATE, FERRITIN, TIBC, IRON, RETICCTPCT in the last 72 hours. Urine analysis:    Component Value Date/Time   COLORURINE AMBER (A) 12/20/2019 1238   APPEARANCEUR CLOUDY (A) 12/20/2019 1238   LABSPEC 1.012 12/20/2019 1238   PHURINE 6.0 12/20/2019 1238   GLUCOSEU NEGATIVE 12/20/2019 1238   HGBUR LARGE (A) 12/20/2019 1238   BILIRUBINUR NEGATIVE 12/20/2019 1238   KETONESUR NEGATIVE 12/20/2019 1238   PROTEINUR 100 (A)  12/20/2019 1238   UROBILINOGEN 0.2 05/26/2015 1521   NITRITE NEGATIVE 12/20/2019 1238   LEUKOCYTESUR LARGE (A) 12/20/2019 1238    Radiological Exams on Admission: DG Chest 2 View  Result Date: 02/11/2020 CLINICAL DATA:  Shortness of breath, CHF.  EXAM: CHEST - 2 VIEW COMPARISON:  Chest radiograph 12/20/2019 FINDINGS: Unchanged position of a left chest single lead AICD device. Unchanged cardiomegaly. Central pulmonary vascular congestion. Right basilar opacity may reflect atelectasis, pneumonia and/or asymmetric edema. No definite pleural effusion. No evidence of pneumothorax. No acute bony abnormality. Thoracic spondylosis IMPRESSION: Unchanged cardiomegaly. Central pulmonary vascular congestion. Right basilar opacity which may reflect atelectasis, pneumonia and/or asymmetric edema. Electronically Signed   By: Jackey Loge DO   On: 02/11/2020 15:00    EKG: Independently reviewed.  Assessment/Plan Principal Problem:   Acute on chronic combined systolic and diastolic CHF (congestive heart failure) (HCC) Active Problems:   Essential hypertension   Chronic renal insufficiency, stage 3 (moderate) (HCC)   Diabetes mellitus type 2 in obese (HCC)    1. Acute on chronic combined CHF - 1. EF 20-25% 2. Seems to be brought on by incorrect and/or missed dosing of diuretics per patients own report. 3. CHF pathway 4. Diuresing with Lasix 60mg  IV BID to start 5. Continue home aldactone 6. Got 40mg  Lasix in ED 1. Already 1.5L UOP, so that's promising at least 7. Daily BMP 2. DM2 - 1. Hold Glipizide 2. Mod scale SSI AC/HS 3. HTN - 1. Cont home BP meds 4. CKD stage 3 - 1. Monitor creat with diuresis 5. H/o PAF - 1. Cont coreg 2. Cont eliquis 3. Had DCCV last month it looks like 1. Actually looks like this wasn't successful based on 3/5 office visit note so they switched to rate control 4. Despite this, currently In sinus rhythm on monitor  DVT prophylaxis: Eliquis Code Status: Full Family  Communication: No family in room Disposition Plan: Home after admit Consults called: None Admission status: Place in 35    Tamiah Dysart M. DO [facility]  How to contact the [facility] Attending or Consulting provider 7A - 7P or covering provider during after hours 7P -7A, for this patient?  1. Check the care team in [facility] and look for a) attending/consulting [facility] provider listed and b) the [facility] team listed 2. Log into [email or web address]  Amion Physician Scheduling and messaging for groups and whole hospitals  On call and physician scheduling software for group practices, residents, hospitalists and other medical providers for call, clinic, rotation and shift schedules. On[employer] is a hospital-wide system for scheduling doctors and paging doctors on call. EasyPlot is for scientific plotting and data analysis.  [email or web address]  and use [place]'s universal password to access. If you do not have the password, please contact the hospital operator.  3. Locate the [facility] provider you are looking for under [facility] and page to a number that you can be directly reached. 4. If you still have difficulty reaching the provider, please page the [facility] (Director on Call) for the Hospitalists listed on amion for assistance.  02/11/2020, 8:14 PM

## 2020-02-11 NOTE — ED Provider Notes (Signed)
[name] EMERGENCY DEPARTMENT Provider Note   CSN: [id number] Arrival date & time: 02/11/20  1410     History Chief Complaint  Patient presents with  . Congestive Heart Failure    Ryan Macdonald is a 86 y.o. male.  HPI Patient is an 86 year old male with a PMH of nonischemic cardiomyopathy, CHF (LVEF 23% on 9/19) hypertension, hyperlipidemia, A. fib (on Eliquis), history of PE, CKD, type 2 diabetes and COPD presenting to the ED today due to lower extremity edema.  Patient says that he recently realized that he had been taking the wrong medication in place of his diuretic.  He says that he has noticed worsening fluid in his legs over the past week.  He endorses a cough productive of clear phlegm that is his baseline.  He denies orthopnea, dyspnea on exertion, chest pain, shortness of breath, fever, chills, nausea, vomiting or diarrhea.    Past Medical History:  Diagnosis Date  . AKI (acute kidney injury) (HCC) 02/2019  . Atrial fibrillation (HCC)   . Cardiomyopathy (HCC)   . Cat scratch fever    removed mass on right side neck  . Cataracts, bilateral   . CHF (congestive heart failure) (HCC)   . Chronic kidney disease   . Corns and callosities   . Diabetes mellitus without complication (HCC)    TYPE 2  . Dyspnea   . Erythema intertrigo   . Flat foot   . Gout   . High cholesterol   . Hx of urethral stricture   . Hypertension   . Kidney stones   . Morbid obesity (HCC)   . Nonischemic cardiomyopathy (HCC)    a. ? 2009 Cath in [place] - nl cors per pt;  b. 09/2013 Echo: EF 25-30%, sev diff HK.  . Obesity   . Osteoarthritis   . PAF (paroxysmal atrial fibrillation) (HCC)    a. post-op hip in 2014 - prev on xarelto.  . Renal insufficiency     Patient Active Problem List   Diagnosis Date Noted  . Secondary hypercoagulable state (HCC) 01/16/2020  . AKI (acute kidney injury) (HCC) 03/14/2019  . Spasm of muscle of lower back 03/14/2019  . Acute renal failure  (ARF) (HCC) 03/09/2019  . Transaminitis   . Acute on chronic renal failure (HCC) 03/08/2019  . Acute exacerbation of CHF (congestive heart failure) (HCC) 11/23/2018  . Acute on chronic heart failure (HCC) 10/19/2018  . Pulmonary emphysema (HCC)   . Chronic respiratory failure with hypoxia (HCC)   . Morbid obesity (HCC) 09/09/2018  . URI (upper respiratory infection) 08/31/2018  . UTI (urinary tract infection) 08/31/2018  . Chronic systolic (congestive) heart failure (HCC) 08/28/2018  . CHF (congestive heart failure) (HCC) 08/14/2018  . Acute on chronic combined systolic and diastolic CHF (congestive heart failure) (HCC) 07/18/2018  . First degree AV block   . Shortness of breath 04/29/2018  . Acute HFrEF (heart failure with reduced ejection fraction) (HCC) 04/29/2018  . Acute respiratory failure with hypoxia (HCC) 09/27/2017  . CHF exacerbation (HCC) 09/27/2017  . Renal insufficiency   . Acute on chronic systolic CHF (congestive heart failure) (HCC) 07/08/2017  . Left knee pain 07/08/2017  . Acute bronchitis 05/10/2017  . Acute congestive heart failure (HCC) 04/14/2017  . Elevated troponin 04/14/2017  . Lactic acidosis   . Acute on chronic systolic heart failure, NYHA class 2 (HCC) 02/03/2017  . Diabetes mellitus type 2 in obese (HCC) 02/03/2017  . CAP (community acquired pneumonia) 02/03/2017  .  Lobar pneumonia (HCC)   . Diabetes mellitus with complication (HCC)   . Acute on chronic respiratory failure with hypoxia (HCC)   . Acute on chronic combined systolic and diastolic heart failure (HCC) 05/19/2016  . Depression 05/19/2016  . Gout 05/19/2016  . 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 NSVT (nonsustained ventricular tachycardia) (HCC)   . Chronic renal insufficiency, stage 3 (moderate) (HCC)   . Hyperkalemia 08/21/2015  . Pulmonary embolism (HCC)   . Acute respiratory failure (HCC)   . Osteoarthritis of left hip 10/25/2013  . Atrial  fibrillation (HCC) 10/25/2013  . S/P hip replacement 10/24/2013  . Nonischemic cardiomyopathy (HCC)   . Obesity (BMI 30-39.9)   . Essential hypertension   . Hyperlipidemia     Past Surgical History:  Procedure Laterality Date  . CARDIAC CATHETERIZATION  1980's  . CARDIOVERSION N/A 01/15/2020   Procedure: CARDIOVERSION;  Surgeon: Laurey Morale, MD;  Location: [facility] ENDOSCOPY;  Service: Cardiovascular;  Laterality: N/A;  . CIRCUMCISION  1940's  . CORONARY STENT INTERVENTION N/A 08/18/2018   Procedure: CORONARY STENT INTERVENTION;  Surgeon: Corky Crafts, MD;  Location: [facility] INVASIVE CV LAB;  Service: Cardiovascular;  Laterality: N/A;  . CYSTOSCOPY WITH URETHRAL DILATATION  10/23/2013  . CYSTOSCOPY WITH URETHRAL DILATATION N/A 10/23/2013   Procedure: CYSTOSCOPY WITH URETHRAL DILATATION;  Surgeon: Kathi Ludwig, MD;  Location: [facility] OR;  Service: Urology;  Laterality: N/A;  . INCISION AND DRAINAGE OF WOUND Right 1980's   "cat scratch" (10/23/2013)  . INGUINAL HERNIA REPAIR Right 1950's  . INTRAVASCULAR ULTRASOUND/IVUS N/A 08/18/2018   Procedure: Intravascular Ultrasound/IVUS;  Surgeon: Corky Crafts, MD;  Location: [facility] INVASIVE CV LAB;  Service: Cardiovascular;  Laterality: N/A;  . KIDNEY STONE SURGERY Right 1970's; 1983   "twice"  . LEFT HEART CATH AND CORONARY ANGIOGRAPHY N/A 08/18/2018   Procedure: LEFT HEART CATH AND CORONARY ANGIOGRAPHY;  Surgeon: Corky Crafts, MD;  Location: [facility] INVASIVE CV LAB;  Service: Cardiovascular;  Laterality: N/A;  . TEE WITHOUT CARDIOVERSION N/A 01/15/2020   Procedure: TRANSESOPHAGEAL ECHOCARDIOGRAM (TEE);  Surgeon: Laurey Morale, MD;  Location: [facility] ENDOSCOPY;  Service: Cardiovascular;  Laterality: N/A;  . TONSILLECTOMY AND ADENOIDECTOMY  1940's  . TOTAL HIP ARTHROPLASTY Left 10/23/2013   Procedure: TOTAL HIP ARTHROPLASTY;  Surgeon: Valeria Batman, MD;  Location: [facility] OR;  Service: Orthopedics;  Laterality: Left;       Family  History  Problem Relation Age of Onset  . Heart disease Mother   . Heart Problems Maternal Grandmother   . Other Other        negative for premature CAD    Social History   Tobacco Use  . Smoking status: Former Smoker    Packs/day: 1.00    Years: 15.00    Pack years: 15.00    Types: Cigarettes, Cigars  . Smokeless tobacco: Former Neurosurgeon  . Tobacco comment: quit smoking ~ 50 yr ago  Substance Use Topics  . Alcohol use: No    Alcohol/week: 2.0 standard drinks    Types: 2 Cans of beer per week    Comment: rare beer.  . Drug use: No    Home Medications Prior to Admission medications   Medication Sig Start Date End Date Taking? Authorizing Provider  allopurinol (ZYLOPRIM) 100 MG tablet Take 200 mg by mouth daily.     [provider]  apixaban (ELIQUIS) 2.5 MG TABS tablet Take 1 tablet (2.5 mg total) by mouth 2 (two) times daily. 05/04/18  Angelita Ingles, MD  carvedilol (COREG) 3.125 MG tablet Take 1 tablet (3.125 mg total) by mouth 2 (two) times daily with a meal. 02/01/20   Fenton, Clint R, PA  cyanocobalamin 1000 MCG tablet Take 1 tablet (1,000 mcg total) by mouth daily. 03/13/19   Laurey Morale, MD  diclofenac sodium (VOLTAREN) 1 % GEL Apply 4 g topically 4 (four) times daily. 07/10/17   Valentino Nose, MD  docusate sodium (COLACE) 100 MG capsule Take 100 mg by mouth 2 (two) times daily.    [provider]  finasteride (PROSCAR) 5 MG tablet Take 1 tablet (5 mg total) by mouth daily. 11/29/18   Alford Highland, NP  glipiZIDE (GLUCOTROL) 5 MG tablet Take 5 mg by mouth daily before breakfast.    [provider]  hydrALAZINE (APRESOLINE) 25 MG tablet Take 75 mg by mouth 3 (three) times daily.    [provider]  hydrALAZINE (APRESOLINE) 50 MG tablet Take 1.5 tablets (75 mg total) by mouth 3 (three) times daily. 12/20/19   Laurey Morale, MD  HYDROcodone-acetaminophen (NORCO/VICODIN) 5-325 MG tablet Take 2 tablets by mouth every 4 (four) hours as  needed. 04/09/16   Isa Rankin, MD  ipratropium-albuterol (DUONEB) 0.5-2.5 (3) MG/3ML SOLN Take 3 mLs by nebulization every 4 (four) hours as needed. Patient taking differently: Take 3 mLs by nebulization every 4 (four) hours as needed (wheezing/shortness of breath).  05/14/17   Pearson Grippe, MD  isosorbide mononitrate (IMDUR) 30 MG 24 hr tablet Take 90 mg by mouth daily.    [provider]  isosorbide mononitrate (IMDUR) 60 MG 24 hr tablet Take 1.5 tablets (90 mg total) by mouth daily. 12/20/19   Laurey Morale, MD  ketoconazole (NIZORAL) 2 % shampoo Apply 1 application topically 2 (two) times a week.    [provider]  levothyroxine (SYNTHROID) 125 MCG tablet Take 1 tablet (125 mcg total) by mouth daily before breakfast. 08/01/19   Carlus Pavlov, MD  magnesium oxide (MAG-OX) 400 MG tablet Take 400 mg by mouth daily.    [provider]  methocarbamol (ROBAXIN) 500 MG tablet Take 1 tablet (500 mg total) by mouth every 6 (six) hours as needed for muscle spasms. 03/15/19   Masoudi, Shawna Orleans, MD  nitroGLYCERIN (NITROSTAT) 0.4 MG SL tablet Place 1 tablet (0.4 mg total) under the tongue every 5 (five) minutes x 3 doses as needed for chest pain. Patient not taking: Reported on 02/04/2020 08/19/18   Laverda Page B, NP  rosuvastatin (CRESTOR) 10 MG tablet Take 1 tablet (10 mg total) by mouth daily at 6 PM. 12/22/18   [name], MD  sertraline (ZOLOFT) 25 MG tablet Take 25 mg by mouth daily.     [provider]  spironolactone (ALDACTONE) 25 MG tablet Take 1 tablet (25 mg total) by mouth daily. 04/13/19   Bensimhon, Bevelyn Buckles, MD  tamsulosin (FLOMAX) 0.4 MG CAPS capsule Take 0.4 mg by mouth at bedtime.    [provider]  torsemide (DEMADEX) 20 MG tablet Take 3 tablets (60 mg total) by mouth daily. 02/04/20   Duke Salvia, MD    Allergies    Lisinopril and Xarelto [rivaroxaban]  Review of Systems   Review of Systems  Constitutional: Negative for  chills and fever.  HENT: Negative for rhinorrhea and sore throat.   Eyes: Negative for pain and visual disturbance.  Respiratory: Positive for cough. Negative for shortness of breath and wheezing.   Cardiovascular: Positive for leg  swelling. Negative for chest pain and palpitations.  Gastrointestinal: Negative for abdominal pain, diarrhea, nausea and vomiting.  Genitourinary: Negative for decreased urine volume and dysuria.  Musculoskeletal: Negative for arthralgias and back pain.  Skin: Negative for color change and rash.  Neurological: Negative for seizures, syncope and weakness.  Psychiatric/Behavioral: Negative for agitation.  All other systems reviewed and are negative.   Physical Exam Updated Vital Signs BP 106/74   Pulse (!) 104   Temp 98 F (36.7 C) (Oral)   Resp (!) 22   Ht 5\' 7"  (1.702 m)   Wt 103.9 kg   SpO2 99%   BMI 35.87 kg/m   Physical Exam Vitals and nursing note reviewed.  Constitutional:      General: He is not in acute distress.    Appearance: He is well-developed. He is obese. He is not ill-appearing.  HENT:     Head: Normocephalic and atraumatic.     Right Ear: External ear normal.     Left Ear: External ear normal.     Nose: Nose normal. No congestion or rhinorrhea.     Mouth/Throat:     Mouth: Mucous membranes are moist.     Pharynx: Oropharynx is clear.  Eyes:     Extraocular Movements: Extraocular movements intact.     Pupils: Pupils are equal, round, and reactive to light.  Cardiovascular:     Rate and Rhythm: Normal rate and regular rhythm.     Pulses: Normal pulses.     Heart sounds: Normal heart sounds.     Comments: JVD present. Pulmonary:     Effort: Pulmonary effort is normal. No respiratory distress.     Breath sounds: No stridor. Rhonchi (diffuse) and rales (diffuse) present. No wheezing.  Abdominal:     General: There is no distension.     Palpations: Abdomen is soft.     Tenderness: There is no abdominal tenderness. There is no  guarding or rebound.  Musculoskeletal:        General: Normal range of motion.     Cervical back: Normal range of motion and neck supple. No rigidity.     Right lower leg: Edema present.     Left lower leg: Edema present.     Comments: Significant 2+ bilateral extremity edema of the feet and shins to the knee.  Skin:    General: Skin is warm and dry.     Capillary Refill: Capillary refill takes less than 2 seconds.  Neurological:     General: No focal deficit present.     Mental Status: He is alert and oriented to person, place, and time. Mental status is at baseline.  Psychiatric:        Mood and Affect: Mood normal.     ED Results / Procedures / Treatments   Labs (all labs ordered are listed, but only abnormal results are displayed) Labs Reviewed  BASIC METABOLIC PANEL - Abnormal; Notable for the following components:      Result Value   CO2 20 (*)    BUN 56 (*)    Creatinine, Ser 1.62 (*)    GFR calc non Af Amer 39 (*)    GFR calc Af Amer 45 (*)    All other components within normal limits  CBC - Abnormal; Notable for the following components:   RBC 3.09 (*)    Hemoglobin 8.8 (*)    HCT 28.6 (*)    RDW 16.1 (*)    All other components within normal  limits  PROTIME-INR - Abnormal; Notable for the following components:   Prothrombin Time 15.5 (*)    All other components within normal limits  BRAIN NATRIURETIC PEPTIDE - Abnormal; Notable for the following components:   B Natriuretic Peptide 1,569.0 (*)    All other components within normal limits  TROPONIN I (HIGH SENSITIVITY) - Abnormal; Notable for the following components:   Troponin I (High Sensitivity) 116 (*)    All other components within normal limits  SARS CORONAVIRUS 2 (TAT 6-24 HRS)  TROPONIN I (HIGH SENSITIVITY)    EKG EKG Interpretation  Date/Time:  Monday February 11 2020 14:18:30 EDT Ventricular Rate:  103 PR Interval:    QRS Duration: 104 QT Interval:  374 QTC Calculation: 489 R Axis:   14 Text  Interpretation: Accelerated Junctional rhythm Low voltage QRS Nonspecific T wave abnormality Abnormal ECG No significant change since last tracing Confirmed by Wandra Arthurs [phone]) on 02/11/2020 7:05:14 PM   Radiology DG Chest 2 View  Result Date: 02/11/2020 CLINICAL DATA:  Shortness of breath, CHF. EXAM: CHEST - 2 VIEW COMPARISON:  Chest radiograph 12/20/2019 FINDINGS: Unchanged position of a left chest single lead AICD device. Unchanged cardiomegaly. Central pulmonary vascular congestion. Right basilar opacity may reflect atelectasis, pneumonia and/or asymmetric edema. No definite pleural effusion. No evidence of pneumothorax. No acute bony abnormality. Thoracic spondylosis IMPRESSION: Unchanged cardiomegaly. Central pulmonary vascular congestion. Right basilar opacity which may reflect atelectasis, pneumonia and/or asymmetric edema. Electronically Signed   By: Ryan Simmering DO   On: 02/11/2020 15:00    Procedures Procedures (including critical care time)  Medications Ordered in ED Medications  allopurinol (ZYLOPRIM) tablet 200 mg (has no administration in time range)  apixaban (ELIQUIS) tablet 2.5 mg (has no administration in time range)  carvedilol (COREG) tablet 3.125 mg (has no administration in time range)  vitamin B-12 (CYANOCOBALAMIN) tablet 1,000 mcg (has no administration in time range)  isosorbide mononitrate (IMDUR) 24 hr tablet 90 mg (has no administration in time range)  hydrALAZINE (APRESOLINE) tablet 75 mg (has no administration in time range)  levothyroxine (SYNTHROID) tablet 125 mcg (has no administration in time range)  magnesium oxide (MAG-OX) tablet 400 mg (has no administration in time range)  spironolactone (ALDACTONE) tablet 25 mg (has no administration in time range)  diclofenac sodium (VOLTAREN) 1 % transdermal gel 4 g (has no administration in time range)  docusate sodium (COLACE) capsule 100 mg (has no administration in time range)  finasteride (PROSCAR) tablet 5  mg (has no administration in time range)  HYDROcodone-acetaminophen (NORCO/VICODIN) 5-325 MG per tablet 2 tablet (has no administration in time range)  ipratropium-albuterol (DUONEB) 0.5-2.5 (3) MG/3ML nebulizer solution 3 mL (has no administration in time range)  methocarbamol (ROBAXIN) tablet 500 mg (has no administration in time range)  rosuvastatin (CRESTOR) tablet 10 mg (has no administration in time range)  sertraline (ZOLOFT) tablet 25 mg (has no administration in time range)  tamsulosin (FLOMAX) capsule 0.4 mg (has no administration in time range)  furosemide (LASIX) injection 60 mg (has no administration in time range)  furosemide (LASIX) injection 40 mg (40 mg Intravenous Given 02/11/20 1737)  acetaminophen-codeine 120-12 MG/5ML solution 2.5 mL (2.5 mLs Oral Given 02/11/20 1910)    ED Course  I have reviewed the triage vital signs and the nursing notes.  Pertinent labs & imaging results that were available during my care of the patient were reviewed by me and considered in my medical decision making (see chart for details).  MDM Rules/Calculators/A&P                     Patient is an 86 year old male with a PMH of nonischemic cardiomyopathy, CHF (LVEF 23% on 9/19) hypertension, hyperlipidemia, A. fib (on Eliquis), history of PE, CKD, type 2 diabetes and COPD presenting to the ED today due to lower extremity edema.  On exam, patient has 2+ bilateral lower extremity edema and diffuse rales and rhonchi on auscultation.  Vital signs stable.  Afebrile.  On arrival, patient appears generally well and is displaying no signs of acute distress.  His breathing is even, unlabored and he has no signs of hypoxia.  Patient appears volume overloaded with rales and rhonchi on lung auscultation, bilateral pitting edema and JVD.  EKG shows accelerated junctional rhythm which appears consistent with previous.  Chest x-ray shows unchanged cardiomegaly, "central pulmonary vascular congestion with right  basilar opacity which may reflect atelectasis, pneumonia and/or asymmetric edema."  CBC with hemoglobin 8.8 which is lower than recent values.  BMP with BUN 56 and creatinine 1.62 which are mildly elevated from previous.  BNP 1569.  Initial troponin 116 concerning for demand ischemia in the setting of likely CHF exacerbation.  Lesser concern for pneumonia as patient denies fever.  Patient given 40 mg Lasix IV and produced approximately 1.3 L urine while in the ED.  Given severity of troponinemia, discussed patient care with cardiology who recommends further diuresis.  Patient admitted to hospitalist service.  For details on hospital course following admission, please refer to inpatient team's note.  Patient stable at time of handoff.  Patient assessed and evaluated with Dr. Silverio Lay.  Delray Alt, MD   Final Clinical Impression(s) / ED Diagnoses Final diagnoses:  Acute on chronic systolic congestive heart failure [facility])    Rx / DC Orders ED Discharge Orders    None       Delray Alt, MD 02/12/20 0128    Charlynne Pander, MD 02/13/20 1049

## 2020-02-12 ENCOUNTER — Encounter (HOSPITAL_COMMUNITY): Payer: Self-pay | Admitting: Internal Medicine

## 2020-02-12 DIAGNOSIS — I509 Heart failure, unspecified: Secondary | ICD-10-CM | POA: Diagnosis not present

## 2020-02-12 DIAGNOSIS — Z7901 Long term (current) use of anticoagulants: Secondary | ICD-10-CM | POA: Diagnosis not present

## 2020-02-12 DIAGNOSIS — Z86711 Personal history of pulmonary embolism: Secondary | ICD-10-CM | POA: Diagnosis not present

## 2020-02-12 DIAGNOSIS — N1832 Chronic kidney disease, stage 3b: Secondary | ICD-10-CM | POA: Diagnosis present

## 2020-02-12 DIAGNOSIS — Z95810 Presence of automatic (implantable) cardiac defibrillator: Secondary | ICD-10-CM | POA: Diagnosis not present

## 2020-02-12 DIAGNOSIS — I4819 Other persistent atrial fibrillation: Secondary | ICD-10-CM | POA: Diagnosis not present

## 2020-02-12 DIAGNOSIS — I471 Supraventricular tachycardia: Secondary | ICD-10-CM | POA: Diagnosis present

## 2020-02-12 DIAGNOSIS — R319 Hematuria, unspecified: Secondary | ICD-10-CM | POA: Diagnosis not present

## 2020-02-12 DIAGNOSIS — I2510 Atherosclerotic heart disease of native coronary artery without angina pectoris: Secondary | ICD-10-CM | POA: Diagnosis present

## 2020-02-12 DIAGNOSIS — K5900 Constipation, unspecified: Secondary | ICD-10-CM | POA: Diagnosis present

## 2020-02-12 DIAGNOSIS — E1169 Type 2 diabetes mellitus with other specified complication: Secondary | ICD-10-CM | POA: Diagnosis not present

## 2020-02-12 DIAGNOSIS — Z9119 Patient's noncompliance with other medical treatment and regimen: Secondary | ICD-10-CM | POA: Diagnosis not present

## 2020-02-12 DIAGNOSIS — N179 Acute kidney failure, unspecified: Secondary | ICD-10-CM | POA: Diagnosis present

## 2020-02-12 DIAGNOSIS — K921 Melena: Secondary | ICD-10-CM | POA: Diagnosis not present

## 2020-02-12 DIAGNOSIS — E669 Obesity, unspecified: Secondary | ICD-10-CM | POA: Diagnosis present

## 2020-02-12 DIAGNOSIS — E873 Alkalosis: Secondary | ICD-10-CM | POA: Diagnosis present

## 2020-02-12 DIAGNOSIS — D649 Anemia, unspecified: Secondary | ICD-10-CM | POA: Diagnosis not present

## 2020-02-12 DIAGNOSIS — I428 Other cardiomyopathies: Secondary | ICD-10-CM | POA: Diagnosis present

## 2020-02-12 DIAGNOSIS — B9620 Unspecified Escherichia coli [E. coli] as the cause of diseases classified elsewhere: Secondary | ICD-10-CM | POA: Diagnosis present

## 2020-02-12 DIAGNOSIS — Z20822 Contact with and (suspected) exposure to covid-19: Secondary | ICD-10-CM | POA: Diagnosis present

## 2020-02-12 DIAGNOSIS — J9621 Acute and chronic respiratory failure with hypoxia: Secondary | ICD-10-CM | POA: Diagnosis present

## 2020-02-12 DIAGNOSIS — I5043 Acute on chronic combined systolic (congestive) and diastolic (congestive) heart failure: Secondary | ICD-10-CM

## 2020-02-12 DIAGNOSIS — E1122 Type 2 diabetes mellitus with diabetic chronic kidney disease: Secondary | ICD-10-CM | POA: Diagnosis present

## 2020-02-12 DIAGNOSIS — E039 Hypothyroidism, unspecified: Secondary | ICD-10-CM | POA: Diagnosis present

## 2020-02-12 DIAGNOSIS — Z6833 Body mass index (BMI) 33.0-33.9, adult: Secondary | ICD-10-CM | POA: Diagnosis not present

## 2020-02-12 DIAGNOSIS — I130 Hypertensive heart and chronic kidney disease with heart failure and stage 1 through stage 4 chronic kidney disease, or unspecified chronic kidney disease: Secondary | ICD-10-CM | POA: Diagnosis present

## 2020-02-12 DIAGNOSIS — N183 Chronic kidney disease, stage 3 unspecified: Secondary | ICD-10-CM | POA: Diagnosis not present

## 2020-02-12 DIAGNOSIS — N390 Urinary tract infection, site not specified: Secondary | ICD-10-CM | POA: Diagnosis present

## 2020-02-12 DIAGNOSIS — I5023 Acute on chronic systolic (congestive) heart failure: Secondary | ICD-10-CM | POA: Diagnosis not present

## 2020-02-12 DIAGNOSIS — I480 Paroxysmal atrial fibrillation: Secondary | ICD-10-CM | POA: Diagnosis present

## 2020-02-12 LAB — BASIC METABOLIC PANEL
Anion gap: 11 (ref 5–15)
BUN: 53 mg/dL — ABNORMAL HIGH (ref 8–23)
CO2: 23 mmol/L (ref 22–32)
Calcium: 9.1 mg/dL (ref 8.9–10.3)
Chloride: 105 mmol/L (ref 98–111)
Creatinine, Ser: 1.53 mg/dL — ABNORMAL HIGH (ref 0.61–1.24)
GFR calc Af Amer: 48 mL/min — ABNORMAL LOW (ref >60)
GFR calc non Af Amer: 41 mL/min — ABNORMAL LOW (ref >60)
Glucose, Bld: 116 mg/dL — ABNORMAL HIGH (ref 70–99)
Potassium: 4 mmol/L (ref 3.5–5.1)
Sodium: 139 mmol/L (ref 135–145)

## 2020-02-12 LAB — GLUCOSE, CAPILLARY
Glucose-Capillary: 116 mg/dL — ABNORMAL HIGH (ref 70–99)
Glucose-Capillary: 119 mg/dL — ABNORMAL HIGH (ref 70–99)
Glucose-Capillary: 143 mg/dL — ABNORMAL HIGH (ref 70–99)
Glucose-Capillary: 114 mg/dL — ABNORMAL HIGH (ref 70–99)

## 2020-02-12 LAB — SARS CORONAVIRUS 2 (TAT 6-24 HRS): SARS Coronavirus 2: NEGATIVE

## 2020-02-12 MED ORDER — FUROSEMIDE 10 MG/ML IJ SOLN
80.0000 mg | Freq: Two times a day (BID) | INTRAMUSCULAR | Status: DC
  Administered 2020-02-12 – 2020-02-14 (×4): 80 mg via INTRAVENOUS
  Filled 2020-02-12 (×4): qty 8

## 2020-02-12 MED ORDER — MAGNESIUM SULFATE 2 GM/50ML IV SOLN
2.0000 g | Freq: Once | INTRAVENOUS | Status: AC
  Administered 2020-02-12: 2 g via INTRAVENOUS
  Filled 2020-02-12: qty 50

## 2020-02-12 NOTE — Discharge Instructions (Signed)

## 2020-02-12 NOTE — Progress Notes (Signed)
Progress Note    Ryan Macdonald  UVO:[id number] DOB: 07-15-1934  DOA: 02/11/2020 PCP: [facility]    Brief Narrative:   Chief complaint: LE edema  Medical records reviewed and are as summarized below:  Ryan Macdonald is an 86 y.o. male with a past medical history that includes nonischemic cardiomyopathy with an EF of 20 to 25%, AICD placement recently, PAF on Eliquis had DCCV last month, PE, diabetes, hypertension presents emergency department March 15 chief complaint bilateral lower extremity edema.  Work-up in the emergency department consistent with acute on chronic heart failure with an elevated BNP, chest x-ray with central pulmonary vascular congestion right basilar opacity which may reflect atelectasis, pneumonia and/or asymmetric edema.  Assessment/Plan:   Principal Problem:   Acute on chronic combined systolic and diastolic CHF (congestive heart failure) (HCC) Active Problems:   Essential hypertension   Obesity (BMI 30-39.9)   Atrial fibrillation (HCC)   Chronic renal insufficiency, stage 3 (moderate) (HCC)   Diabetes mellitus type 2 in obese ([place])   #1.  Acute on chronic systolic diastolic heart failure.  Patient reports a medication "mixed up" where he took the wrong medication.  He developed a gradual worsening of the lower extremity edema as well as cough with production of clear sputum.  He also experienced weight gain of 10 pounds over the last week.  He was provided with IV Lasix.  Volume status this morning is -1.4 L.  Weight on the scales on the floor 233.9 pounds down to 231.7 this morning.  Echo done last month reveals an EF of 20 to 25%, left ventricle severely decreased function, right ventricle systolic function mildly reduced.  High-sensitivity troponin I 116 then 103.  No chest pain.  No hypoxia.  He does have mild increased work of breathing this morning.  Of note chart review indicates patient underwent TEE/DCCV last month which was unsuccessful  after 4 attempts.  He went into sinus rhythm but 2 weeks later seen by [facility] and found to be in A. fib with heart rates of 100s to 120s feeling more short of breath.  No triggers identified -Continue IV Lasix 60 mg twice daily -Continue home Aldactone -Continue Coreg -Holding home Demadex -Monitor on telemetry -Cardiology consult for clarification of medications  #2.  Hypertension.  Blood pressure is quite soft this morning.  Home medications include Coreg, hydralazine, as well as Imdur -Lasix as noted above -Continue Coreg with parameters -Monitor closely  3.  Atrial fibrillation.  EKG on admission shows junctional tachycardia Low voltage, extremity leads Nonspecific T abnormalities, lateral leads Prolonged QT interval.  Status post DCCV last month.  See #1.  Chart review indicates failed amiodarone previously with significant hypothyroidism.  Also some question about compliance.  Coreg was started at that time (3/5).  In addition [place] Vascor 6. -Continue Eliquis -See #1  #4.  Chronic renal insufficiency stage III.  Creatinine 1.5 this morning.  This appears to be close to his baseline -Monitor closely given the need for IV Lasix -Hold other nephrotoxins as able -Monitor urine output -Recheck in the morning  #5.  Diabetes.  Globin A1c 5.7.  Serum glucose 93 on admission.  Home medications include glipizide -Hold glipizide -Carb modified diet -Sliding scale insulin for optimal control   Family Communication/Anticipated D/C date and plan/Code Status   DVT prophylaxis:. Code Status: Full Code.  Family Communication:patient Disposition Plan: home   Medical Consultants:    [facility]    Anti-Infectives:  None  Subjective:   Awake alert.  Denies pain or discomfort.  Appears slightly short of breath states he is not at his baseline.  States his swelling is "a little better"   Objective:    Vitals:   02/11/20 2000 02/11/20 2215 02/12/20 0522 02/12/20 1136    BP: 121/75 127/77 139/79 103/71  Pulse: 95 (!) 108 (!) 101 (!) 116  Resp: (!) 35 17 16 16   Temp:  98.4 F (36.9 C) (!) 97.4 F (36.3 C)   TempSrc:  Oral Oral   SpO2: 95% 95% 94% 96%  Weight:  106.1 kg 105.1 kg   Height:        Intake/Output Summary (Last 24 hours) at 02/12/2020 1215 Last data filed at 02/12/2020 0847 Gross per 24 hour  Intake 236 ml  Output 1400 ml  Net -1164 ml   Filed Weights   02/11/20 1419 02/11/20 2215 02/12/20 0522  Weight: 103.9 kg 106.1 kg 105.1 kg    Exam: General: Obese alert cooperative no increased work of breathing CV: Heart sounds are distant irregularly irregular no murmur gallop or rub 2+ lower extremity edema from feet to thighs abdomen slightly distended Respiratory: Mild to moderate increased work of breathing.  He can finish sentences but is somewhat panting and using abdominal accessory muscles intermittently.  Breath sounds are with diminished airflow coarse throughout no wheeze Musculoskeletal: Joints without swelling/erythema Abdomen: Obese soft positive bowel sounds throughout no guarding or rebounding nontender to palpation Neuro: Awake alert oriented x3 speech clear facial symmetry  Data Reviewed:   I have personally reviewed following labs and imaging studies:  Labs: Labs show the following:   Basic Metabolic Panel: Recent Labs  Lab 02/11/20 1424 02/12/20 0451  NA 138 139  K 4.8 4.0  CL 107 105  CO2 20* 23  GLUCOSE 93 116*  BUN 56* 53*  CREATININE 1.62* 1.53*  CALCIUM 8.9 9.1   GFR Estimated Creatinine Clearance: 42.3 mL/min (A) (by C-G formula based on SCr of 1.53 mg/dL (H)). Liver Function Tests: No results for input(s): AST, ALT, ALKPHOS, BILITOT, PROT, ALBUMIN in the last 168 hours. No results for input(s): LIPASE, AMYLASE in the last 168 hours. No results for input(s): AMMONIA in the last 168 hours. Coagulation profile Recent Labs  Lab 02/11/20 1424  INR 1.2    CBC: Recent Labs  Lab 02/11/20 1424   WBC 7.4  HGB 8.8*  HCT 28.6*  MCV 92.6  PLT 267   Cardiac Enzymes: No results for input(s): CKTOTAL, CKMB, CKMBINDEX, TROPONINI in the last 168 hours. BNP (last 3 results) No results for input(s): PROBNP in the last 8760 hours. CBG: Recent Labs  Lab 02/11/20 2139 02/12/20 0611 02/12/20 1042  GLUCAP 177* 116* 119*   D-Dimer: No results for input(s): DDIMER in the last 72 hours. Hgb A1c: Recent Labs    02/11/20 2015  HGBA1C 5.7*   Lipid Profile: No results for input(s): CHOL, HDL, LDLCALC, TRIG, CHOLHDL, LDLDIRECT in the last 72 hours. Thyroid function studies: No results for input(s): TSH, T4TOTAL, T3FREE, THYROIDAB in the last 72 hours.  Invalid input(s): FREET3 Anemia work up: No results for input(s): VITAMINB12, FOLATE, FERRITIN, TIBC, IRON, RETICCTPCT in the last 72 hours. Sepsis Labs: Recent Labs  Lab 02/11/20 1424  WBC 7.4    Microbiology Recent Results (from the past 240 hour(s))  SARS CORONAVIRUS 2 (TAT 6-24 HRS) Nasopharyngeal Nasopharyngeal Swab     Status: None   Collection Time: 02/11/20  6:09 PM  Specimen: Nasopharyngeal Swab  Result Value Ref Range Status   SARS Coronavirus 2 NEGATIVE NEGATIVE Final    Comment: (NOTE) SARS-CoV-2 target nucleic acids are NOT DETECTED. The SARS-CoV-2 RNA is generally detectable in upper and lower respiratory specimens during the acute phase of infection. Negative results do not preclude SARS-CoV-2 infection, do not rule out co-infections with other pathogens, and should not be used as the sole basis for treatment or other patient management decisions. Negative results must be combined with clinical observations, patient history, and epidemiological information. The expected result is Negative. Fact Sheet for Patients: HairSlick.no Fact Sheet for Healthcare Providers: quierodirigir.com This test is not yet approved or cleared by the [place] FDA and   has been authorized for detection and/or diagnosis of SARS-CoV-2 by FDA under an Emergency Use Authorization (EUA). This EUA will remain  in effect (meaning this test can be used) for the duration of the COVID-19 declaration under Section 56 4(b)(1) of the Act, 21 U.S.C. section 360bbb-3(b)(1), unless the authorization is terminated or revoked sooner. Performed at [facility], [address], [place], [place] [address]     Procedures and diagnostic studies:  DG Chest 2 View  Result Date: 02/11/2020 CLINICAL DATA:  Shortness of breath, CHF. EXAM: CHEST - 2 VIEW COMPARISON:  Chest radiograph 12/20/2019 FINDINGS: Unchanged position of a left chest single lead AICD device. Unchanged cardiomegaly. Central pulmonary vascular congestion. Right basilar opacity may reflect atelectasis, pneumonia and/or asymmetric edema. No definite pleural effusion. No evidence of pneumothorax. No acute bony abnormality. Thoracic spondylosis IMPRESSION: Unchanged cardiomegaly. Central pulmonary vascular congestion. Right basilar opacity which may reflect atelectasis, pneumonia and/or asymmetric edema. Electronically Signed   By: Jackey Loge DO   On: 02/11/2020 15:00    Medications:   . allopurinol  200 mg Oral Daily  . apixaban  2.5 mg Oral BID  . carvedilol  3.125 mg Oral BID WC  . diclofenac Sodium  4 g Topical QID  . docusate sodium  100 mg Oral BID  . finasteride  5 mg Oral Daily  . furosemide  60 mg Intravenous BID  . hydrALAZINE  75 mg Oral TID  . insulin aspart  0-15 Units Subcutaneous TID WC  . insulin aspart  0-5 Units Subcutaneous QHS  . isosorbide mononitrate  90 mg Oral Daily  . levothyroxine  125 mcg Oral QAC breakfast  . magnesium oxide  400 mg Oral Daily  . rosuvastatin  10 mg Oral q1800  . sertraline  25 mg Oral Daily  . sodium chloride flush  3 mL Intravenous Q12H  . spironolactone  25 mg Oral Daily  . tamsulosin  0.4 mg Oral QHS  . cyanocobalamin  1,000 mcg Oral Daily    Continuous Infusions: . sodium chloride       LOS: 0 days   Gwenyth Bender NP  [facility]   How to contact the [facility] Attending or Consulting provider 7A - 7P or covering provider during after hours 7P -7A, for this patient?  1. Check the care team in [facility] and look for a) attending/consulting TRH provider listed and b) the [facility] team listed 2. Log into [email or web address] and use [place]'s universal password to access. If you do not have the password, please contact the hospital operator. 3. Locate the [facility] provider you are looking for under [facility] and page to a number that you can be directly reached. 4. If you still have difficulty reaching the provider, please page the [facility] (Director  on Call) for the Hospitalists listed on amion for assistance.  02/12/2020, 12:15 PM

## 2020-02-12 NOTE — Progress Notes (Signed)
Orthopedic Tech Progress Note Patient Details:  Ryan Macdonald 13-Jan-1934 [id number]  Ortho Devices Type of Ortho Device: Ace wrap, Unna boot Ortho Device/Splint Location: B LE Ortho Device/Splint Interventions: Application, Ordered   Post Interventions Patient Tolerated: Well Instructions Provided: Care of device   [name] 02/12/2020, 3:54 PM

## 2020-02-12 NOTE — TOC Initial Note (Addendum)
Transition of Care [facility]) - Initial/Assessment Note    Patient Details  Name: Ryan Macdonald MRN: [id number] Date of Birth: 20-Oct-1934  [facility]) CM/SW Contact:    Marilu Favre, RN Phone Number: 02/12/2020, 3:29 PM  Clinical Narrative:                  Spoke to patient and wife Ryan Macdonald at bedside. Patient from home with wife. Has Paramedicine.   PCP is at [facility]   He has home oxygen through [facility] that he uses PRN, he has portable tank and concentrator. Patient is able to have prescriptions filled and has transportation to appointments.   Patient has a cane at home but states he does not use it that often. Per wife and patient no mobility   issues. Expected Discharge Plan: Home/Self Care Barriers to Discharge: Continued Medical Work up   Patient Goals and CMS Choice Patient states their goals for this hospitalization and ongoing recovery are:: to return home CMS Medicare.gov Compare Post Acute Care list provided to:: Patient Choice offered to / list presented to : NA  Expected Discharge Plan and Services Expected Discharge Plan: Home/Self Care   Discharge Planning Services: CM Consult   Living arrangements for the past 2 months: Apartment                 DME Arranged: N/A DME Agency: NA       HH Arranged: NA          Prior Living Arrangements/Services Living arrangements for the past 2 months: Apartment Lives with:: Spouse Patient language and need for interpreter reviewed:: Yes Do you feel safe going back to the place where you live?: Yes      Need for Family Participation in Patient Care: Yes (Comment) Care giver support system in place?: Yes (comment) Current home services: DME Criminal Activity/Legal Involvement Pertinent to Current Situation/Hospitalization: No - Comment as needed  Activities of Daily Living Home Assistive Devices/Equipment: Walker (specify type) ADL Screening (condition at time of  admission) Patient's cognitive ability adequate to safely complete daily activities?: Yes Is the patient deaf or have difficulty hearing?: No Does the patient have difficulty seeing, even when wearing glasses/contacts?: No Does the patient have difficulty concentrating, remembering, or making decisions?: No Patient able to express need for assistance with ADLs?: Yes Does the patient have difficulty dressing or bathing?: No Independently performs ADLs?: Yes (appropriate for developmental age) Does the patient have difficulty walking or climbing stairs?: No Weakness of Legs: Both Weakness of Arms/Hands: None  Permission Sought/Granted   Permission granted to share information with : Yes, Verbal Permission Granted  Share Information with NAME: Ryan Macdonald wife           Emotional Assessment Appearance:: Appears stated age Attitude/Demeanor/Rapport: Engaged Affect (typically observed): Accepting Orientation: : Oriented to Self, Oriented to Place, Oriented to  Time, Oriented to Situation Alcohol / Substance Use: Not Applicable Psych Involvement: No (comment)  Admission diagnosis:  Acute on chronic combined systolic (congestive) and diastolic (congestive) heart failure (HCC) [I50.43] Acute on chronic heart failure ([place]) [I50.9] Patient Active Problem List   Diagnosis Date Noted  . Secondary hypercoagulable state ([place]) 01/16/2020  . AKI (acute kidney injury) ([place]) 03/14/2019  . Spasm of muscle of lower back 03/14/2019  . Acute renal failure (ARF) ([place]) 03/09/2019  . Transaminitis   . Acute on chronic renal failure ([place]) 03/08/2019  . Acute exacerbation of CHF (congestive heart failure) ([place]) 11/23/2018  .  Acute on chronic heart failure (HCC) 10/19/2018  . Pulmonary emphysema (HCC)   . Chronic respiratory failure with hypoxia (HCC)   . Morbid obesity (HCC) 09/09/2018  . URI (upper respiratory infection) 08/31/2018  . UTI (urinary tract infection) 08/31/2018  . Chronic systolic  (congestive) heart failure (HCC) 08/28/2018  . CHF (congestive heart failure) (HCC) 08/14/2018  . Acute on chronic combined systolic and diastolic CHF (congestive heart failure) (HCC) 07/18/2018  . First degree AV block   . Shortness of breath 04/29/2018  . Acute HFrEF (heart failure with reduced ejection fraction) (HCC) 04/29/2018  . Acute respiratory failure with hypoxia (HCC) 09/27/2017  . CHF exacerbation (HCC) 09/27/2017  . Renal insufficiency   . Acute on chronic systolic CHF (congestive heart failure) (HCC) 07/08/2017  . Left knee pain 07/08/2017  . Acute bronchitis 05/10/2017  . Acute congestive heart failure (HCC) 04/14/2017  . Elevated troponin 04/14/2017  . Lactic acidosis   . Acute on chronic systolic heart failure, NYHA class 2 (HCC) 02/03/2017  . Diabetes mellitus type 2 in obese (HCC) 02/03/2017  . CAP (community acquired pneumonia) 02/03/2017  . Lobar pneumonia (HCC)   . Diabetes mellitus with complication (HCC)   . Acute on chronic respiratory failure with hypoxia (HCC)   . Acute on chronic combined systolic and diastolic heart failure (HCC) 05/19/2016  . Depression 05/19/2016  . Gout 05/19/2016  . 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 NSVT (nonsustained ventricular tachycardia) (HCC)   . Chronic renal insufficiency, stage 3 (moderate) (HCC)   . Hyperkalemia 08/21/2015  . Pulmonary embolism (HCC)   . Acute respiratory failure (HCC)   . Osteoarthritis of left hip 10/25/2013  . Atrial fibrillation (HCC) 10/25/2013  . S/P hip replacement 10/24/2013  . Nonischemic cardiomyopathy (HCC)   . Obesity (BMI 30-39.9)   . Essential hypertension   . Hyperlipidemia    PCP:  [facility] Pharmacy:   [facility] [place], [place] - [address] [place] [address] Phone: [phone] Fax: [phone]  [id number] - [place], [place] - [address] AT [facility] OF [address] [place] [address] Phone: [phone] Fax: [phone]  [id number] - [place],  - [address] AT [facility] [address] [place] [address] Phone: [phone] Fax: [phone]     Social Determinants of Health (SDOH) Interventions    Readmission Risk Interventions No flowsheet data found.

## 2020-02-12 NOTE — Consult Note (Addendum)
Advanced Heart Failure Team Consult Note   Primary Physician: [facility] PCP-Cardiologist:  [name], MD  Heart Failure: Dr Shirlee Latch   Reason for Consultation: Heart Failure   HPI:    Ryan Macdonald is seen today for evaluation of heart failure  at the request of Dr Benjamine Mola.   Ryan Macdonald is an 86 year old with a history of systolic HF due toNICM, Biotronic ICD, A Fibon Eliquis, DMII, HTN,CAD s/p pLAD stent in 07/2018 with residual non-obstructive disease,chronic hypoxic respiratory failure on home O2 PRN,depression, and hypothyroidism in the setting of amiodarone.  Admitted 12/26 - 11/28/18 with A/C systolic CHF. Diuresed 21 lbs and meds adjusted as tolerated. Echo showed EF 20-25%. EP was consulted for RV pacing and ICD settings were adjusted => patient was reprogrammed to VDD, now with narrow QRS with pacing.  Followed in the A fib clinic and was last seen 02/01/20. At that time low dose carvedilol added for SVT.   Followed by HF Paramedicine and was last seen on 02/11/20. He had not been taking his medications prepared by out paramedic. Sounds like he was taking medications from an old pill box. Weight had gone up > 20  pounds.  During the visit he was SOB at rest and had 2-3 + lower extremity edema. He was sent to the ED for an evaluation.   Admitted with increased shortness of breath. CXR concerning for vascular congestion. Started IV lasix.  Pertinent admission labs included: Hgb 8.8, HS Trop 116>103, BNP 1569, SARS negative, hgb A1C 5.7.    01/15/20 TEE EF 20-25% RV, mildly reduced.   Review of Systems: [y] = yes, [ ]  = no   . General: Weight gain [Y; Weight loss [ ] ; Anorexia [ ] ; Fatigue [ ] ; Fever [ ] ; Chills [ ] ; Weakness Y]  . Cardiac: Chest pain/pressure [ ] ; Resting SOB [Y ]; Exertional SOB [Y ]; Orthopnea [Y ]; Pedal Edema [Y ]; Palpitations [ ] ; Syncope [ ] ; Presyncope [ ] ; Paroxysmal nocturnal dyspnea[ ]   . Pulmonary: Cough [ ] ; Wheezing[ ] ;  Hemoptysis[ ] ; Sputum [ ] ; Snoring [ ]   . GI: Vomiting[ ] ; Dysphagia[ ] ; Melena[ ] ; Hematochezia [ ] ; Heartburn[ ] ; Abdominal pain [ ] ; Constipation [ ] ; Diarrhea [ ] ; BRBPR [ ]   . GU: Hematuria[ ] ; Dysuria [ ] ; Nocturia[ ]   . Vascular: Pain in legs with walking [ ] ; Pain in feet with lying flat [ ] ; Non-healing sores [ ] ; Stroke [ ] ; TIA [ ] ; Slurred speech [ ] ;  . Neuro: Headaches[ ] ; Vertigo[ ] ; Seizures[ ] ; Paresthesias[ ] ;Blurred vision [ ] ; Diplopia [ ] ; Vision changes [ ]   . Ortho/Skin: Arthritis [ ] ; Joint pain [ ] ; Muscle pain [ ] ; Joint swelling [ ] ; Back Pain [Y ]; Rash [ ]   . Psych: Depression[ ] ; Anxiety[ ]   . Heme: Bleeding problems [ ] ; Clotting disorders [ ] ; Anemia [ Y]  . Endocrine: Diabetes [ ] ; Thyroid dysfunction[Y ]  Home Medications Prior to Admission medications   Medication Sig Start Date End Date Taking? Authorizing Provider  allopurinol (ZYLOPRIM) 100 MG tablet Take 200 mg by mouth daily.    Yes [provider]  apixaban (ELIQUIS) 2.5 MG TABS tablet Take 1 tablet (2.5 mg total) by mouth 2 (two) times daily. 05/04/18  Yes , MD  carvedilol (COREG) 3.125 MG tablet Take 1 tablet (3.125 mg total) by mouth 2 (two) times daily with a meal. 02/01/20  Yes Fenton,  Clint R, PA  cyanocobalamin 1000 MCG tablet Take 1 tablet (1,000 mcg total) by mouth daily. 03/13/19  Yes Laurey Morale, MD  diclofenac sodium (VOLTAREN) 1 % GEL Apply 4 g topically 4 (four) times daily. 07/10/17  Yes Valentino Nose, MD  docusate sodium (COLACE) 100 MG capsule Take 100 mg by mouth 2 (two) times daily.   Yes [provider]  finasteride (PROSCAR) 5 MG tablet Take 1 tablet (5 mg total) by mouth daily. 11/29/18  Yes Alford Highland, NP  glipiZIDE (GLUCOTROL) 5 MG tablet Take 5 mg by mouth daily before breakfast.   Yes [provider]  hydrALAZINE (APRESOLINE) 50 MG tablet Take 1.5 tablets (75 mg total) by mouth 3 (three) times daily. 12/20/19  Yes Laurey Morale,  MD  HYDROcodone-acetaminophen (NORCO/VICODIN) 5-325 MG tablet Take 2 tablets by mouth every 4 (four) hours as needed. 04/09/16  Yes Isa Rankin, MD  ipratropium-albuterol (DUONEB) 0.5-2.5 (3) MG/3ML SOLN Take 3 mLs by nebulization every 4 (four) hours as needed. Patient taking differently: Take 3 mLs by nebulization every 4 (four) hours as needed (wheezing/shortness of breath).  05/14/17  Yes Pearson Grippe, MD  isosorbide mononitrate (IMDUR) 30 MG 24 hr tablet Take 90 mg by mouth daily.   Yes [provider]  isosorbide mononitrate (IMDUR) 60 MG 24 hr tablet Take 1.5 tablets (90 mg total) by mouth daily. 12/20/19  Yes Laurey Morale, MD  levothyroxine (SYNTHROID) 125 MCG tablet Take 1 tablet (125 mcg total) by mouth daily before breakfast. 08/01/19  Yes Carlus Pavlov, MD  magnesium oxide (MAG-OX) 400 MG tablet Take 400 mg by mouth daily.   Yes [provider]  methocarbamol (ROBAXIN) 500 MG tablet Take 1 tablet (500 mg total) by mouth every 6 (six) hours as needed for muscle spasms. 03/15/19  Yes Masoudi, Elhamalsadat, MD  rosuvastatin (CRESTOR) 10 MG tablet Take 1 tablet (10 mg total) by mouth daily at 6 PM. 12/22/18  Yes [name], MD  sertraline (ZOLOFT) 25 MG tablet Take 25 mg by mouth daily.    Yes [provider]  spironolactone (ALDACTONE) 25 MG tablet Take 1 tablet (25 mg total) by mouth daily. 04/13/19  Yes Allsion Nogales, Bevelyn Buckles, MD  tamsulosin (FLOMAX) 0.4 MG CAPS capsule Take 0.4 mg by mouth at bedtime.   Yes [provider]  torsemide (DEMADEX) 20 MG tablet Take 3 tablets (60 mg total) by mouth daily. 02/04/20  Yes Duke Salvia, MD    Past Medical History: Past Medical History:  Diagnosis Date  . AKI (acute kidney injury) (HCC) 02/2019  . Atrial fibrillation (HCC)   . Cardiomyopathy (HCC)   . Cat scratch fever    removed mass on right side neck  . Cataracts, bilateral   . CHF (congestive heart failure) (HCC)   . Chronic kidney disease   .  Corns and callosities   . Diabetes mellitus without complication (HCC)    TYPE 2  . Dyspnea   . Erythema intertrigo   . Flat foot   . Gout   . High cholesterol   . Hx of urethral stricture   . Hypertension   . Kidney stones   . Morbid obesity (HCC)   . Nonischemic cardiomyopathy (HCC)    a. ? 2009 Cath in [place] - nl cors per pt;  b. 09/2013 Echo: EF 25-30%, sev diff HK.  . Obesity   . Osteoarthritis   . PAF (paroxysmal atrial fibrillation) (HCC)    a.  post-op hip in 2014 - prev on xarelto.  . Renal insufficiency     Past Surgical History: Past Surgical History:  Procedure Laterality Date  . CARDIAC CATHETERIZATION  1980's  . CARDIOVERSION N/A 01/15/2020   Procedure: CARDIOVERSION;  Surgeon: Laurey Morale, MD;  Location: [facility] ENDOSCOPY;  Service: Cardiovascular;  Laterality: N/A;  . CIRCUMCISION  1940's  . CORONARY STENT INTERVENTION N/A 08/18/2018   Procedure: CORONARY STENT INTERVENTION;  Surgeon: Corky Crafts, MD;  Location: [facility] INVASIVE CV LAB;  Service: Cardiovascular;  Laterality: N/A;  . CYSTOSCOPY WITH URETHRAL DILATATION  10/23/2013  . CYSTOSCOPY WITH URETHRAL DILATATION N/A 10/23/2013   Procedure: CYSTOSCOPY WITH URETHRAL DILATATION;  Surgeon: Kathi Ludwig, MD;  Location[facility] OR;  Service: Urology;  Laterality: N/A;  . INCISION AND DRAINAGE OF WOUND Right 1980's   "cat scratch" (10/23/2013)  . INGUINAL HERNIA REPAIR Right 1950's  . INTRAVASCULAR ULTRASOUND/IVUS N/A 08/18/2018   Procedure: Intravascular Ultrasound/IVUS;  Surgeon: Corky Crafts, MD;  Location: [facility] INVASIVE CV LAB;  Service: Cardiovascular;  Laterality: N/A;  . KIDNEY STONE SURGERY Right 1970's; 1983   "twice"  . LEFT HEART CATH AND CORONARY ANGIOGRAPHY N/A 08/18/2018   Procedure: LEFT HEART CATH AND CORONARY ANGIOGRAPHY;  Surgeon: Corky Crafts, MD;  Location: [facility] INVASIVE CV LAB;  Service: Cardiovascular;  Laterality: N/A;  . TEE WITHOUT CARDIOVERSION N/A 01/15/2020   Procedure:  TRANSESOPHAGEAL ECHOCARDIOGRAM (TEE);  Surgeon: Laurey Morale, MD;  Location: [facility] ENDOSCOPY;  Service: Cardiovascular;  Laterality: N/A;  . TONSILLECTOMY AND ADENOIDECTOMY  1940's  . TOTAL HIP ARTHROPLASTY Left 10/23/2013   Procedure: TOTAL HIP ARTHROPLASTY;  Surgeon: Valeria Batman, MD;  Location: [facility] OR;  Service: Orthopedics;  Laterality: Left;    Family History: Family History  Problem Relation Age of Onset  . Heart disease Mother   . Heart Problems Maternal Grandmother   . Other Other        negative for premature CAD    Social History: Social History   Socioeconomic History  . Marital status: Married    Spouse name: Not on file  . Number of children: Not on file  . Years of education: Not on file  . Highest education level: Not on file  Occupational History  . Occupation: Retired    Comment: Former Public house manager  Tobacco Use  . Smoking status: Former Smoker    Packs/day: 1.00    Years: 15.00    Pack years: 15.00    Types: Cigarettes, Cigars  . Smokeless tobacco: Former Neurosurgeon  . Tobacco comment: quit smoking ~ 50 yr ago  Substance and Sexual Activity  . Alcohol use: No    Alcohol/week: 2.0 standard drinks    Types: 2 Cans of beer per week    Comment: rare beer.  . Drug use: No  . Sexual activity: Yes  Other Topics Concern  . Not on file  Social History Narrative   Lives in [place] with wife. Moved from [place] (2013). Does not routinely exercise.   Social Determinants of Health   Financial Resource Strain:   . Difficulty of Paying Living Expenses:   Food Insecurity:   . Worried About Programme researcher, broadcasting/film/video in the Last Year:   . Barista in the Last Year:   Transportation Needs:   . Freight forwarder (Medical):   Marland Kitchen Lack of Transportation (Non-Medical):   Physical Activity:   . Days of Exercise per Week:   . Minutes of Exercise  per Session:   Stress:   . Feeling of Stress :   Social Connections:   . Frequency of Communication with Friends and  Family:   . Frequency of Social Gatherings with Friends and Family:   . Attends Religious Services:   . Active Member of Clubs or Organizations:   . Attends Banker Meetings:   Marland Kitchen Marital Status:     Allergies:  Allergies  Allergen Reactions  . Lisinopril Swelling    Facial swelling  . Xarelto [Rivaroxaban] Other (See Comments)    "Blood came out of my penis"    Objective:    Vital Signs:   Temp:  [97.4 F (36.3 C)-98.9 F (37.2 C)] 98.7 F (37.1 C) (03/16 1233) Pulse Rate:  [93-116] 110 (03/16 1233) Resp:  [16-35] 17 (03/16 1233) BP: (103-139)/(71-87) 106/73 (03/16 1233) SpO2:  [94 %-99 %] 97 % (03/16 1233) Weight:  [103.9 kg-106.1 kg] 105.1 kg (03/16 0522)    Weight change: Filed Weights   02/11/20 1419 02/11/20 2215 02/12/20 0522  Weight: 103.9 kg 106.1 kg 105.1 kg    Intake/Output:   Intake/Output Summary (Last 24 hours) at 02/12/2020 1249 Last data filed at 02/12/2020 0847 Gross per 24 hour  Intake 236 ml  Output 1400 ml  Net -1164 ml      Physical Exam    General:  Sitting in the chair.  No resp difficulty HEENT: normal Neck: supple. JVP to jaw  . Carotids 2+ bilat; no bruits. No lymphadenopathy or thyromegaly appreciated. Cor: PMI nondisplaced. Tachy Irregular rate & rhythm. No rubs, gallops or murmurs. Lungs: clear Abdomen: soft, nontender, nondistended. No hepatosplenomegaly. No bruits or masses. Good bowel sounds. Extremities: no cyanosis, clubbing, rash, R/LLE 3+  edema Neuro: alert & orientedx3, cranial nerves grossly intact. moves all 4 extremities w/o difficulty. Affect pleasant   Telemetry   Sinus Tach 100s   EKG    Junctional Tach 100 bpm   Labs   Basic Metabolic Panel: Recent Labs  Lab 02/11/20 1424 02/12/20 0451  NA 138 139  K 4.8 4.0  CL 107 105  CO2 20* 23  GLUCOSE 93 116*  BUN 56* 53*  CREATININE 1.62* 1.53*  CALCIUM 8.9 9.1    Liver Function Tests: No results for input(s): AST, ALT, ALKPHOS, BILITOT,  PROT, ALBUMIN in the last 168 hours. No results for input(s): LIPASE, AMYLASE in the last 168 hours. No results for input(s): AMMONIA in the last 168 hours.  CBC: Recent Labs  Lab 02/11/20 1424  WBC 7.4  HGB 8.8*  HCT 28.6*  MCV 92.6  PLT 267    Cardiac Enzymes: No results for input(s): CKTOTAL, CKMB, CKMBINDEX, TROPONINI in the last 168 hours.  BNP: BNP (last 3 results) Recent Labs    02/11/20 1739  BNP 1,569.0*    ProBNP (last 3 results) No results for input(s): PROBNP in the last 8760 hours.   CBG: Recent Labs  Lab 02/11/20 2139 02/12/20 0611 02/12/20 1042  GLUCAP 177* 116* 119*    Coagulation Studies: Recent Labs    02/11/20 1424  LABPROT 15.5*  INR 1.2     Imaging   DG Chest 2 View  Result Date: 02/11/2020 CLINICAL DATA:  Shortness of breath, CHF. EXAM: CHEST - 2 VIEW COMPARISON:  Chest radiograph 12/20/2019 FINDINGS: Unchanged position of a left chest single lead AICD device. Unchanged cardiomegaly. Central pulmonary vascular congestion. Right basilar opacity may reflect atelectasis, pneumonia and/or asymmetric edema. No definite pleural effusion. No evidence of pneumothorax.  No acute bony abnormality. Thoracic spondylosis IMPRESSION: Unchanged cardiomegaly. Central pulmonary vascular congestion. Right basilar opacity which may reflect atelectasis, pneumonia and/or asymmetric edema. Electronically Signed   By: Kellie Simmering DO   On: 02/11/2020 15:00      Medications:     Current Medications: . allopurinol  200 mg Oral Daily  . apixaban  2.5 mg Oral BID  . carvedilol  3.125 mg Oral BID WC  . diclofenac Sodium  4 g Topical QID  . docusate sodium  100 mg Oral BID  . finasteride  5 mg Oral Daily  . furosemide  60 mg Intravenous BID  . hydrALAZINE  75 mg Oral TID  . insulin aspart  0-15 Units Subcutaneous TID WC  . insulin aspart  0-5 Units Subcutaneous QHS  . isosorbide mononitrate  90 mg Oral Daily  . levothyroxine  125 mcg Oral QAC breakfast    . magnesium oxide  400 mg Oral Daily  . rosuvastatin  10 mg Oral q1800  . sertraline  25 mg Oral Daily  . sodium chloride flush  3 mL Intravenous Q12H  . spironolactone  25 mg Oral Daily  . tamsulosin  0.4 mg Oral QHS  . cyanocobalamin  1,000 mcg Oral Daily     Infusions: . sodium chloride          Assessment/Plan   1. A/C Systolic HF  Primarily nonischemic cardiomyopathy. EF has been low since at least 2014.  He has single chamber Biotronik ICD. QRS not wide enough for CRT.Settings adjusted in the past to prevent RV pacing in the face of a long 1st degree AV block.  -12/2019 TEE EF 20-25%  -Marked volume overload in the setting of home medication errors. Increase lasix to 80 mg twice a day. - Continue hydralazine 100 mg three times a day + imdur 90 mg daily.   - No ace or arni with history of angioedema on lisinopril .  - Continue spiro 25 mg daily - On low dose carvedilol 3.125 mg twice a day. In the past he was not bb due to low PR interval. Carvedilol added 02/01/20 per A fib clinic. EKG today.   - Add unna boots.   2.  PAF - Failed DC-CV x4 2/2021but was subsequently found to "sinus tachycardia ". -Followed in the A Fib clinic -Failure amio in the past due to hypothyroidism -Continue low dose carvedilol. - EKG now. ? He is back in A fib. - Once diuresed he will need TEE/DC-CV. He is not sure if he has been taking eliquis as directed.   - Continue eliquis 2.5 mg twice a day   3. CAD: S/P PCI 07/2018 to pLAD but CAD does not explain his cardiomyopathy.  - No chest pain.   4. CKD Stage IIIb  -Creatinine baseline 1.7-2  -Creatinine 1.53 . Follow BMET daily.   5. Hypothyroidism -Suspect due to amiodarone. Continue levothyroxine.   6. Anemia  -Hgb 8.8 . No obvious source. -Check iron panel. No obvious source.   Will need to HF Paramedicine to resume at discharge. HF team will follow closely.    Length of Stay: 0  Darrick Grinder, NP  02/12/2020, 12:49 PM  Advanced  Heart Failure Team Pager [phone] (M-F; 7a - 4p)  Please contact [facility] for night-coverage after hours (4p -7a ) and weekends on [email or web address]   Patient seen and examined with the above-signed Advanced Practice Provider and/or Housestaff. I personally reviewed laboratory data, imaging studies and relevant notes. I  independently examined the patient and formulated the important aspects of the plan. I have edited the note to reflect any of my changes or salient points. I have personally discussed the plan with the patient and/or family.   86 y/o male with h/o systolic HF due to NICM EF 25% and CKD 3b. He has also struggled with PAF/PAT/PAFL and significant conduction disease with marked 1AVB  Underwent attempted DC-CV in 2/21 which was unsuccessful acutely but then subsequently found to be in sinus tach.   Has been off amio due to h/o hypothyroidism.   Now admitted with recurrent HF with marked volume overload in setting of medication noncompliance (was taking meds from wrong pillbox) and found to have recurrent AF with RVR.   On exam Elderly male mildly SOB HEENT: normal Neck: supple. JVP to jaw . Carotids 2+ bilat; no bruits. No lymphadenopathy or thryomegaly appreciated. Cor: PMI nondisplaced. IRR tahy. No rubs, gallops or murmurs. Lungs: clear Abdomen: soft, nontender, nondistended. No hepatosplenomegaly. No bruits or masses. Good bowel sounds. Extremities: no cyanosis, clubbing, rash, 3+ edema (wrapped)  Neuro: alert & orientedx3, cranial nerves grossly intact. moves all 4 extremities w/o difficulty. Affect pleasant  Difficult situation.   It is unclear to me how large a role his AF is playing in his worsening HF but I suspect it likely is significant. He is now markedly volume overloaded. Will diurese with IV lasix.   From an AF standpoint it seems like he may be running out of options. has been difficult to achieve rate control and he appears quite symptomatic when out of  rhythm. Tikosyn not ideal with CKD but may be an option. Otherwise we will need to consider rechallenge with amiodarone versus AVN ablation + CRT which may help avoid previous issues of RV pacing. Will d/w Drs. Mclean and Graciela Husbands.   Can consider amyloid w/u.   Arvilla Meres, MD  4:47 PM

## 2020-02-13 ENCOUNTER — Ambulatory Visit: Payer: Non-veteran care | Admitting: Podiatry

## 2020-02-13 DIAGNOSIS — K5900 Constipation, unspecified: Secondary | ICD-10-CM | POA: Diagnosis present

## 2020-02-13 DIAGNOSIS — D649 Anemia, unspecified: Secondary | ICD-10-CM

## 2020-02-13 DIAGNOSIS — I5023 Acute on chronic systolic (congestive) heart failure: Secondary | ICD-10-CM

## 2020-02-13 DIAGNOSIS — I4819 Other persistent atrial fibrillation: Secondary | ICD-10-CM

## 2020-02-13 DIAGNOSIS — D638 Anemia in other chronic diseases classified elsewhere: Secondary | ICD-10-CM | POA: Diagnosis present

## 2020-02-13 DIAGNOSIS — I5043 Acute on chronic combined systolic (congestive) and diastolic (congestive) heart failure: Secondary | ICD-10-CM

## 2020-02-13 LAB — BASIC METABOLIC PANEL
Anion gap: 12 (ref 5–15)
BUN: 52 mg/dL — ABNORMAL HIGH (ref 8–23)
CO2: 24 mmol/L (ref 22–32)
Calcium: 9.2 mg/dL (ref 8.9–10.3)
Chloride: 104 mmol/L (ref 98–111)
Creatinine, Ser: 1.46 mg/dL — ABNORMAL HIGH (ref 0.61–1.24)
GFR calc Af Amer: 51 mL/min — ABNORMAL LOW (ref >60)
GFR calc non Af Amer: 44 mL/min — ABNORMAL LOW (ref >60)
Glucose, Bld: 127 mg/dL — ABNORMAL HIGH (ref 70–99)
Potassium: 4.4 mmol/L (ref 3.5–5.1)
Sodium: 140 mmol/L (ref 135–145)

## 2020-02-13 LAB — FERRITIN: Ferritin: 34 ng/mL (ref 24–336)

## 2020-02-13 LAB — RETICULOCYTES
Immature Retic Fract: 33.9 % — ABNORMAL HIGH (ref 2.3–15.9)
RBC.: 2.96 MIL/uL — ABNORMAL LOW (ref 4.22–5.81)
Retic Count, Absolute: 74.3 10*3/uL (ref 19.0–186.0)
Retic Ct Pct: 2.5 % (ref 0.4–3.1)

## 2020-02-13 LAB — CBC
HCT: 27.1 % — ABNORMAL LOW (ref 39.0–52.0)
Hemoglobin: 8.6 g/dL — ABNORMAL LOW (ref 13.0–17.0)
MCH: 28.9 pg (ref 26.0–34.0)
MCHC: 31.7 g/dL (ref 30.0–36.0)
MCV: 90.9 fL (ref 80.0–100.0)
Platelets: 273 10*3/uL (ref 150–400)
RBC: 2.98 MIL/uL — ABNORMAL LOW (ref 4.22–5.81)
RDW: 16.1 % — ABNORMAL HIGH (ref 11.5–15.5)
WBC: 6 10*3/uL (ref 4.0–10.5)
nRBC: 0 % (ref 0.0–0.2)

## 2020-02-13 LAB — IRON AND TIBC
Iron: 31 ug/dL — ABNORMAL LOW (ref 45–182)
Saturation Ratios: 7 % — ABNORMAL LOW (ref 17.9–39.5)
TIBC: 426 ug/dL (ref 250–450)
UIBC: 395 ug/dL

## 2020-02-13 LAB — VITAMIN B12: Vitamin B-12: 2064 pg/mL — ABNORMAL HIGH (ref 180–914)

## 2020-02-13 LAB — GLUCOSE, CAPILLARY
Glucose-Capillary: 117 mg/dL — ABNORMAL HIGH (ref 70–99)
Glucose-Capillary: 118 mg/dL — ABNORMAL HIGH (ref 70–99)
Glucose-Capillary: 125 mg/dL — ABNORMAL HIGH (ref 70–99)
Glucose-Capillary: 125 mg/dL — ABNORMAL HIGH (ref 70–99)

## 2020-02-13 LAB — OCCULT BLOOD X 1 CARD TO LAB, STOOL: Fecal Occult Bld: POSITIVE — AB

## 2020-02-13 LAB — FOLATE: Folate: 8.8 ng/mL (ref >5.9)

## 2020-02-13 MED ORDER — AMIODARONE HCL 200 MG PO TABS
400.0000 mg | ORAL_TABLET | Freq: Two times a day (BID) | ORAL | Status: DC
  Administered 2020-02-13 – 2020-02-19 (×13): 400 mg via ORAL
  Filled 2020-02-13 (×13): qty 2

## 2020-02-13 MED ORDER — BISACODYL 10 MG RE SUPP
10.0000 mg | Freq: Once | RECTAL | Status: AC
  Administered 2020-02-13: 10 mg via RECTAL
  Filled 2020-02-13: qty 1

## 2020-02-13 MED ORDER — BISACODYL 5 MG PO TBEC
5.0000 mg | DELAYED_RELEASE_TABLET | Freq: Every day | ORAL | Status: DC | PRN
  Administered 2020-02-17: 5 mg via ORAL
  Filled 2020-02-13: qty 1

## 2020-02-13 MED ORDER — SODIUM CHLORIDE 0.9 % IV SOLN
510.0000 mg | Freq: Once | INTRAVENOUS | Status: AC
  Administered 2020-02-13: 510 mg via INTRAVENOUS
  Filled 2020-02-13: qty 17

## 2020-02-13 MED ORDER — BISACODYL 5 MG PO TBEC
10.0000 mg | DELAYED_RELEASE_TABLET | Freq: Once | ORAL | Status: AC
  Administered 2020-02-13: 10 mg via ORAL
  Filled 2020-02-13: qty 2

## 2020-02-13 NOTE — Progress Notes (Signed)
BP 97/70 (77), P 103, apresoline held, Md made aware. Pt asymptomatic. Will cont to monitor.

## 2020-02-13 NOTE — Progress Notes (Signed)
Progress Note    Ryan Macdonald  WVP:[id number] DOB: Oct 17, 1934  DOA: 02/11/2020 PCP: [facility]    Brief Narrative:   Chief complaint: LE edema  Medical records reviewed and are as summarized below:  Ryan Macdonald is an 86 y.o. male with a past medical history that includes nonischemic cardiomyopathy with an EF of 20%, AICD placement recently, PAF on Eliquis had DCCV last month, PE, diabetes, hypertension, obesity presented to the emergency department March 15 chief complaint bilateral lower extremity edema likely related to noncompliance with diuretics.  Work-up in the emergency department consistent with acute on chronic heart failure and he was started on Lasix IV and A. fib with RVR was also noted.  He was evaluated by the heart failure team who increased his Lasix resumed his Imdur and noted that it was not completely clear the role A. fib was playing in his worsening heart failure.  Assessment/Plan:   Principal Problem:   Acute on chronic heart failure (HCC) Active Problems:   Essential hypertension   Obesity (BMI 30-39.9)   Atrial fibrillation (HCC)   Chronic renal insufficiency, stage 3 (moderate) (HCC)   Diabetes mellitus type 2 in obese (HCC)   Constipation   Anemia   Morbid obesity ([place])  #1.  Acute on chronic systolic diastolic heart failure.  Volume status -3 L this morning.  Weight is 226 pounds down from 233 pounds. Echo done last month reveals an EF of 20 to 25%, left ventricle severely decreased function, right ventricle systolic function mildly reduced.  High-sensitivity troponin I 116 then 103. Of note chart review indicates patient underwent TEE/DCCV last month which was unsuccessful after 4 attempts.  He went into sinus rhythm but 2 weeks later seen by [facility] and found to be in A. fib with heart rates of 100s to 120s feeling more short of breath.  No triggers identified.  Evaluated by the heart failure team who opined its unclear the role  A. fib is playing in his worsening heart failure recommend an increase in Lasix for continued diuresis. -Continue IV Lasix 80 mg twice daily -Continue home Aldactone -Continue Coreg -Holding home Demadex -Imdur per cardiology -Monitor on telemetry -[facility] and will defer further management to them  #2.  Hypertension.  Blood pressure controlled.  Home medications include Coreg, hydralazine, as well as Imdur -Lasix as noted above -Continue Coreg with parameters -Hydralazine per cardiology -Monitor closely  3.  Atrial fibrillation.  Had an episode of A. fib with RVR at admission.  Heart rate baseline appears to be between 101 110.  Status post DCCV last month.  See #1.  Chart review indicates failed amiodarone previously with significant hypothyroidism.  Also some question about compliance.  Coreg was started at that time (3/5).  In addition [place] Vascor 6. -Continue Eliquis -Continue Coreg -Defer further management to cardiology. -See #1  #4.  Chronic renal insufficiency stage III.  Creatinine 1.4 this morning.  This appears to be close to his baseline and trending downward. -Monitor closely given the need for IV Lasix -Hold other nephrotoxins as able -Monitor urine output -Recheck in the morning  #5.  Diabetes.    Controlled.  Hemoglobin A1c 5.7  -Hold glipizide -Carb modified diet -Sliding scale insulin for optimal control  #6.  Constipation.  Patient reports no bowel movement for 2 days.  Currently on stool softener.  Requesting Dulcolax -Dulcolax -monitor  #6. Anemia: Hemoglobin 8.8 on admission.  Anemia panel reveals iron  31, saturation ratio 7, Retic 2.9, B12 2064. Home meds include eliquis -fobt -consider iron supplement -monitor    Family Communication/Anticipated D/C date and plan/Code Status   DVT prophylaxis: Lovenox ordered. Code Status: Full Code.  Family Communication: patient at bedside Disposition Plan: patient continues to  require aggressive diuresis with IV lasix and monitoring of HR. Will be discharged home when clinically indicated   Medical Consultants:    [facility]   Anti-Infectives:    None  Subjective:   Lying in bed watching television.  Reports feeling "much better and breathing easier".  States less pain in his legs since Unna boots  Objective:    Vitals:   02/12/20 1749 02/12/20 2327 02/13/20 0500 02/13/20 0642  BP: 120/72 107/69  106/64  Pulse: (!) 111 (!) 105  (!) 103  Resp: 17 16  18   Temp: 98.2 F (36.8 C) 98.1 F (36.7 C)  98.3 F (36.8 C)  TempSrc: Oral Oral  Oral  SpO2: 100% 100%  96%  Weight:   102.8 kg   Height:        Intake/Output Summary (Last 24 hours) at 02/13/2020 0915 Last data filed at 02/13/2020 0748 Gross per 24 hour  Intake 298 ml  Output 3500 ml  Net -3202 ml   Filed Weights   02/11/20 2215 02/12/20 0522 02/13/20 0500  Weight: 106.1 kg 105.1 kg 102.8 kg    Exam: General: Obese awake cooperative no acute distress CV: Irregularly irregular I hear no murmur gallop or rub Unna boots to bilateral lower extremity that appear to continue with 1+ pitting edema Respiratory: No increased work of breathing breath sounds are distant but clear I hear no crackles no wheezes Abdomen: Less distended softer positive bowel sounds throughout no guarding or rebounding Musculoskeletal: Joints without swelling/erythema full range of motion Neuro: Alert and oriented x3 speech clear facial symmetry  Data Reviewed:   I have personally reviewed following labs and imaging studies:  Labs: Labs show the following:   Basic Metabolic Panel: Recent Labs  Lab 02/11/20 1424 02/11/20 1424 02/12/20 0451 02/13/20 0328  NA 138  --  139 140  K 4.8   < > 4.0 4.4  CL 107  --  105 104  CO2 20*  --  23 24  GLUCOSE 93  --  116* 127*  BUN 56*  --  53* 52*  CREATININE 1.62*  --  1.53* 1.46*  CALCIUM 8.9  --  9.1 9.2   < > = values in this interval not displayed.     GFR Estimated Creatinine Clearance: 43.8 mL/min (A) (by C-G formula based on SCr of 1.46 mg/dL (H)). Liver Function Tests: No results for input(s): AST, ALT, ALKPHOS, BILITOT, PROT, ALBUMIN in the last 168 hours. No results for input(s): LIPASE, AMYLASE in the last 168 hours. No results for input(s): AMMONIA in the last 168 hours. Coagulation profile Recent Labs  Lab 02/11/20 1424  INR 1.2    CBC: Recent Labs  Lab 02/11/20 1424  WBC 7.4  HGB 8.8*  HCT 28.6*  MCV 92.6  PLT 267   Cardiac Enzymes: No results for input(s): CKTOTAL, CKMB, CKMBINDEX, TROPONINI in the last 168 hours. BNP (last 3 results) No results for input(s): PROBNP in the last 8760 hours. CBG: Recent Labs  Lab 02/12/20 0611 02/12/20 1042 02/12/20 1625 02/12/20 2128 02/13/20 0625  GLUCAP 116* 119* 143* 114* 117*   D-Dimer: No results for input(s): DDIMER in the last 72 hours. Hgb A1c: Recent Labs  02/11/20 2015  HGBA1C 5.7*   Lipid Profile: No results for input(s): CHOL, HDL, LDLCALC, TRIG, CHOLHDL, LDLDIRECT in the last 72 hours. Thyroid function studies: No results for input(s): TSH, T4TOTAL, T3FREE, THYROIDAB in the last 72 hours.  Invalid input(s): FREET3 Anemia work up: Recent Labs    02/13/20 0328  VITAMINB12 2,064*  FOLATE 8.8  FERRITIN 34  TIBC 426  IRON 31*  RETICCTPCT 2.5   Sepsis Labs: Recent Labs  Lab 02/11/20 1424  WBC 7.4    Microbiology Recent Results (from the past 240 hour(s))  SARS CORONAVIRUS 2 (TAT 6-24 HRS) Nasopharyngeal Nasopharyngeal Swab     Status: None   Collection Time: 02/11/20  6:09 PM   Specimen: Nasopharyngeal Swab  Result Value Ref Range Status   SARS Coronavirus 2 NEGATIVE NEGATIVE Final    Comment: (NOTE) SARS-CoV-2 target nucleic acids are NOT DETECTED. The SARS-CoV-2 RNA is generally detectable in upper and lower respiratory specimens during the acute phase of infection. Negative results do not preclude SARS-CoV-2 infection, do not  rule out co-infections with other pathogens, and should not be used as the sole basis for treatment or other patient management decisions. Negative results must be combined with clinical observations, patient history, and epidemiological information. The expected result is Negative. Fact Sheet for Patients: HairSlick.no Fact Sheet for Healthcare Providers: quierodirigir.com This test is not yet approved or cleared by the [place] FDA and  has been authorized for detection and/or diagnosis of SARS-CoV-2 by FDA under an Emergency Use Authorization (EUA). This EUA will remain  in effect (meaning this test can be used) for the duration of the COVID-19 declaration under Section 56 4(b)(1) of the Act, 21 U.S.C. section 360bbb-3(b)(1), unless the authorization is terminated or revoked sooner. Performed at [facility], [address] [place], [place] [address]     Procedures and diagnostic studies:  DG Chest 2 View  Result Date: 02/11/2020 CLINICAL DATA:  Shortness of breath, CHF. EXAM: CHEST - 2 VIEW COMPARISON:  Chest radiograph 12/20/2019 FINDINGS: Unchanged position of a left chest single lead AICD device. Unchanged cardiomegaly. Central pulmonary vascular congestion. Right basilar opacity may reflect atelectasis, pneumonia and/or asymmetric edema. No definite pleural effusion. No evidence of pneumothorax. No acute bony abnormality. Thoracic spondylosis IMPRESSION: Unchanged cardiomegaly. Central pulmonary vascular congestion. Right basilar opacity which may reflect atelectasis, pneumonia and/or asymmetric edema. Electronically Signed   By: Jackey Loge DO   On: 02/11/2020 15:00    Medications:   . allopurinol  200 mg Oral Daily  . apixaban  2.5 mg Oral BID  . carvedilol  3.125 mg Oral BID WC  . diclofenac Sodium  4 g Topical QID  . docusate sodium  100 mg Oral BID  . finasteride  5 mg Oral Daily  . furosemide  80 mg  Intravenous BID  . hydrALAZINE  75 mg Oral TID  . insulin aspart  0-15 Units Subcutaneous TID WC  . insulin aspart  0-5 Units Subcutaneous QHS  . isosorbide mononitrate  90 mg Oral Daily  . levothyroxine  125 mcg Oral QAC breakfast  . magnesium oxide  400 mg Oral Daily  . rosuvastatin  10 mg Oral q1800  . sertraline  25 mg Oral Daily  . sodium chloride flush  3 mL Intravenous Q12H  . spironolactone  25 mg Oral Daily  . tamsulosin  0.4 mg Oral QHS  . cyanocobalamin  1,000 mcg Oral Daily   Continuous Infusions: . sodium chloride    .  ferumoxytol       LOS: 1 day   Gwenyth Bender NP [facility]   How to contact the [facility] Attending or Consulting provider 7A - 7P or covering provider during after hours 7P -7A, for this patient?  1. Check the care team in [facility] and look for a) attending/consulting [facility] provider listed and b) the [facility] team listed 2. Log into [email or web address] and use [facility]'s universal password to access. If you do not have the password, please contact the hospital operator. 3. Locate the [facility] provider you are looking for under [facility] and page to a number that you can be directly reached. 4. If you still have difficulty reaching the provider, please page the [facility] (Director on Call) for the Hospitalists listed on amion for assistance.  02/13/2020, 9:15 AM

## 2020-02-13 NOTE — Progress Notes (Signed)
Patient ID: Ryan Macdonald, male   DOB: [birth year], 86 y.o.   MRN: [id number]     Advanced Heart Failure Rounding Note  PCP-Cardiologist: [name], MD   Subjective:    No complaints today, breathing is getting better.  I/Os negative with weight coming down.    He appears to be in atrial fibrillation still with rate 100s.    Objective:   Weight Range: 102.8 kg Body mass index is 35.5 kg/m.   Vital Signs:   Temp:  [97.6 F (36.4 C)-98.7 F (37.1 C)] 97.6 F (36.4 C) (03/17 1212) Pulse Rate:  [103-111] 105 (03/17 1212) Resp:  [16-18] 16 (03/17 1212) BP: (100-120)/(64-73) 100/67 (03/17 1212) SpO2:  [95 %-100 %] 95 % (03/17 1212) Weight:  [102.8 kg] 102.8 kg (03/17 0500)    Weight change: Filed Weights   02/11/20 2215 02/12/20 0522 02/13/20 0500  Weight: 106.1 kg 105.1 kg 102.8 kg    Intake/Output:   Intake/Output Summary (Last 24 hours) at 02/13/2020 1220 Last data filed at 02/13/2020 1216 Gross per 24 hour  Intake 538 ml  Output 3500 ml  Net -2962 ml      Physical Exam    General:  Well appearing. No resp difficulty HEENT: Normal Neck: Supple. JVP 14. Carotids 2+ bilat; no bruits. No lymphadenopathy or thyromegaly appreciated. Cor: PMI nondisplaced. Mildly tachy, iregular rate & rhythm. No rubs, gallops or murmurs. Lungs: Decreased at bases.  Abdomen: Soft, nontender, nondistended. No hepatosplenomegaly. No bruits or masses. Good bowel sounds. Extremities: No cyanosis, clubbing, rash. 2+ edema to knees.  Neuro: Alert & orientedx3, cranial nerves grossly intact. moves all 4 extremities w/o difficulty. Affect pleasant   Telemetry   Looks like atrial fibrillation rate 100s (personally reviewed)  Labs    CBC Recent Labs    02/11/20 1424 02/13/20 0328  WBC 7.4 6.0  HGB 8.8* 8.6*  HCT 28.6* 27.1*  MCV 92.6 90.9  PLT 267 273   Basic Metabolic Panel Recent Labs    08/65/78 0451 02/13/20 0328  NA 139 140  K 4.0 4.4  CL 105 104  CO2 23 24    GLUCOSE 116* 127*  BUN 53* 52*  CREATININE 1.53* 1.46*  CALCIUM 9.1 9.2   Liver Function Tests No results for input(s): AST, ALT, ALKPHOS, BILITOT, PROT, ALBUMIN in the last 72 hours. No results for input(s): LIPASE, AMYLASE in the last 72 hours. Cardiac Enzymes No results for input(s): CKTOTAL, CKMB, CKMBINDEX, TROPONINI in the last 72 hours.  BNP: BNP (last 3 results) Recent Labs    02/11/20 1739  BNP 1,569.0*    ProBNP (last 3 results) No results for input(s): PROBNP in the last 8760 hours.   D-Dimer No results for input(s): DDIMER in the last 72 hours. Hemoglobin A1C Recent Labs    02/11/20 2015  HGBA1C 5.7*   Fasting Lipid Panel No results for input(s): CHOL, HDL, LDLCALC, TRIG, CHOLHDL, LDLDIRECT in the last 72 hours. Thyroid Function Tests No results for input(s): TSH, T4TOTAL, T3FREE, THYROIDAB in the last 72 hours.  Invalid input(s): FREET3  Other results:   Imaging     No results found.   Medications:     Scheduled Medications: . allopurinol  200 mg Oral Daily  . apixaban  2.5 mg Oral BID  . carvedilol  3.125 mg Oral BID WC  . diclofenac Sodium  4 g Topical QID  . docusate sodium  100 mg Oral BID  . finasteride  5 mg Oral Daily  . furosemide  80 mg Intravenous BID  . hydrALAZINE  75 mg Oral TID  . insulin aspart  0-15 Units Subcutaneous TID WC  . insulin aspart  0-5 Units Subcutaneous QHS  . isosorbide mononitrate  90 mg Oral Daily  . levothyroxine  125 mcg Oral QAC breakfast  . magnesium oxide  400 mg Oral Daily  . rosuvastatin  10 mg Oral q1800  . sertraline  25 mg Oral Daily  . sodium chloride flush  3 mL Intravenous Q12H  . spironolactone  25 mg Oral Daily  . tamsulosin  0.4 mg Oral QHS  . cyanocobalamin  1,000 mcg Oral Daily     Infusions: . sodium chloride       PRN Medications:  sodium chloride, acetaminophen, [START ON 02/14/2020] bisacodyl, guaiFENesin-codeine, HYDROcodone-acetaminophen, ipratropium-albuterol,  methocarbamol, ondansetron (ZOFRAN) IV, sodium chloride flush   Assessment/Plan   1. Acute on chronic systolic CHF: Primarily nonischemic cardiomyopathy. EF has been low since at least 2014.  He has single chamber Biotronik ICD. QRS not wide enough for CRT.Settings adjusted in the past to prevent RV pacing in the face of a long 1st degree AV block.  TEE in 2/21 with EF 20-25%, diffuse hypokinesis and mild LVH, mild RV systolic dysfunction. He was admitted after taking the wrong meds (no diuretic) for a number of days and developing volume overload.  He appears to be in atrial fibrillation. He is still volume overloaded but is diuresing well, creatinine stable at 1.46.  -Continue Lasix 80 mg IV bid.   - Continue spironolactone 25 mg daily - Continue hydralazine 75 mg tid and Imdur 90 mg daily. Bidil not covered by [facility].   -Continue Coreg 3.125 mg bid.  -No ACEI or ARNI with angioedema on lisinopril. - Would hold off on ARB with CKD stage 3 and h/o AKI.  2. CAD: S/p PCI 07/2018 to pLAD but CAD does not explain his cardiomyopathy.  No chest pain.  - He is off Plavix now, on Eliquis for his atrial fibrillation.  - Continue Crestor 3.Atrial fibrillation: He has been in atrial fibrillation alternating with sinus tachycardia recently.    - Continue Eliquis 2.5 mg bid (age, creatinine > 1.5).  - Repeat ECG to confirm rhythm today.  - I am going to try him again on amiodarone to see if we can get him back in NSR predominantly. Will start amiodarone 400 mg bid today.  He has history of hypothyroidism with amiodarone but is on Levoxyl.  4. CKD: Stage 3.  Creatinine stable.  5. Hypothyroidism: Suspect this is due to amiodarone.  He has been on Levoxyl.   Length of Stay: 1  Loralie Champagne, MD  02/13/2020, 12:20 PM  Advanced Heart Failure Team Pager [phone] (M-F; 7a - 4p)  Please contact [facility] for night-coverage after hours (4p -7a ) and weekends on [email or web address]

## 2020-02-14 DIAGNOSIS — N179 Acute kidney failure, unspecified: Secondary | ICD-10-CM

## 2020-02-14 LAB — CBC
HCT: 25.6 % — ABNORMAL LOW (ref 39.0–52.0)
Hemoglobin: 8.2 g/dL — ABNORMAL LOW (ref 13.0–17.0)
MCH: 29 pg (ref 26.0–34.0)
MCHC: 32 g/dL (ref 30.0–36.0)
MCV: 90.5 fL (ref 80.0–100.0)
Platelets: 236 10*3/uL (ref 150–400)
RBC: 2.83 MIL/uL — ABNORMAL LOW (ref 4.22–5.81)
RDW: 15.9 % — ABNORMAL HIGH (ref 11.5–15.5)
WBC: 5.4 10*3/uL (ref 4.0–10.5)
nRBC: 0 % (ref 0.0–0.2)

## 2020-02-14 LAB — BASIC METABOLIC PANEL
Anion gap: 12 (ref 5–15)
BUN: 56 mg/dL — ABNORMAL HIGH (ref 8–23)
CO2: 25 mmol/L (ref 22–32)
Calcium: 9 mg/dL (ref 8.9–10.3)
Chloride: 100 mmol/L (ref 98–111)
Creatinine, Ser: 1.73 mg/dL — ABNORMAL HIGH (ref 0.61–1.24)
GFR calc Af Amer: 41 mL/min — ABNORMAL LOW (ref >60)
GFR calc non Af Amer: 36 mL/min — ABNORMAL LOW (ref >60)
Glucose, Bld: 123 mg/dL — ABNORMAL HIGH (ref 70–99)
Potassium: 4.5 mmol/L (ref 3.5–5.1)
Sodium: 137 mmol/L (ref 135–145)

## 2020-02-14 LAB — GLUCOSE, CAPILLARY
Glucose-Capillary: 120 mg/dL — ABNORMAL HIGH (ref 70–99)
Glucose-Capillary: 118 mg/dL — ABNORMAL HIGH (ref 70–99)
Glucose-Capillary: 148 mg/dL — ABNORMAL HIGH (ref 70–99)
Glucose-Capillary: 140 mg/dL — ABNORMAL HIGH (ref 70–99)

## 2020-02-14 MED ORDER — HYDRALAZINE HCL 25 MG PO TABS
25.0000 mg | ORAL_TABLET | Freq: Three times a day (TID) | ORAL | Status: DC
  Filled 2020-02-14 (×2): qty 1

## 2020-02-14 MED ORDER — ISOSORBIDE MONONITRATE ER 30 MG PO TB24
30.0000 mg | ORAL_TABLET | Freq: Every day | ORAL | Status: DC
  Administered 2020-02-15 – 2020-02-24 (×10): 30 mg via ORAL
  Filled 2020-02-14 (×10): qty 1

## 2020-02-14 NOTE — Consult Note (Signed)
[facility] Consultation Note  Referring Provider: [facility] Primary Care Physician:  [facility]  Reason for Consultation:  Anemia, hemoccult-positive  HPI: Ryan Macdonald is a 86 y.o. male admitted to heart failure for heart failure, afib rvr; has prior history of congestive heart failure EF 20% on apixaban.  We were consulted for patient having one episode of hematochezia in setting of anemia.  Patient reports history of constipation with straining recently before having a bloody stool.  Patient reports now having more regular bowel movements with senokot.  No nausea, vomiting, dysphagia, change in bowel habits (other than above), unintentional weight loss, loss-of-appetite.  No dyschezia.  Reports prior history of manual disimpaction of stool.  Patient reports having colonoscopy over 10 years ago (elsewhere), reportedly normal per patient recollection.   Past Medical History:  Diagnosis Date  . AKI (acute kidney injury) (HCC) 02/2019  . Atrial fibrillation (HCC)   . Cardiomyopathy (HCC)   . Cat scratch fever    removed mass on right side neck  . Cataracts, bilateral   . CHF (congestive heart failure) (HCC)   . Chronic kidney disease   . Corns and callosities   . Diabetes mellitus without complication (HCC)    TYPE 2  . Dyspnea   . Erythema intertrigo   . Flat foot   . Gout   . High cholesterol   . Hx of urethral stricture   . Hypertension   . Kidney stones   . Morbid obesity (HCC)   . Nonischemic cardiomyopathy (HCC)    a. ? 2009 Cath in [place] - nl cors per pt;  b. 09/2013 Echo: EF 25-30%, sev diff HK.  . Obesity   . Osteoarthritis   . PAF (paroxysmal atrial fibrillation) (HCC)    a. post-op hip in 2014 - prev on xarelto.  . Renal insufficiency     Past Surgical History:  Procedure Laterality Date  . CARDIAC CATHETERIZATION  1980's  . CARDIOVERSION N/A 01/15/2020   Procedure: CARDIOVERSION;  Surgeon: Laurey Morale, MD;  Location: [facility]  ENDOSCOPY;  Service: Cardiovascular;  Laterality: N/A;  . CIRCUMCISION  1940's  . CORONARY STENT INTERVENTION N/A 08/18/2018   Procedure: CORONARY STENT INTERVENTION;  Surgeon: Corky Crafts, MD;  Location: [facility] INVASIVE CV LAB;  Service: Cardiovascular;  Laterality: N/A;  . CYSTOSCOPY WITH URETHRAL DILATATION  10/23/2013  . CYSTOSCOPY WITH URETHRAL DILATATION N/A 10/23/2013   Procedure: CYSTOSCOPY WITH URETHRAL DILATATION;  Surgeon: Kathi Ludwig, MD;  Location: [facility] OR;  Service: Urology;  Laterality: N/A;  . INCISION AND DRAINAGE OF WOUND Right 1980's   "cat scratch" (10/23/2013)  . INGUINAL HERNIA REPAIR Right 1950's  . INTRAVASCULAR ULTRASOUND/IVUS N/A 08/18/2018   Procedure: Intravascular Ultrasound/IVUS;  Surgeon: Corky Crafts, MD;  Location: [facility] INVASIVE CV LAB;  Service: Cardiovascular;  Laterality: N/A;  . KIDNEY STONE SURGERY Right 1970's; 1983   "twice"  . LEFT HEART CATH AND CORONARY ANGIOGRAPHY N/A 08/18/2018   Procedure: LEFT HEART CATH AND CORONARY ANGIOGRAPHY;  Surgeon: Corky Crafts, MD;  Location: [facility] INVASIVE CV LAB;  Service: Cardiovascular;  Laterality: N/A;  . TEE WITHOUT CARDIOVERSION N/A 01/15/2020   Procedure: TRANSESOPHAGEAL ECHOCARDIOGRAM (TEE);  Surgeon: Laurey Morale, MD;  Location: [facility] ENDOSCOPY;  Service: Cardiovascular;  Laterality: N/A;  . TONSILLECTOMY AND ADENOIDECTOMY  1940's  . TOTAL HIP ARTHROPLASTY Left 10/23/2013   Procedure: TOTAL HIP ARTHROPLASTY;  Surgeon: Valeria Batman, MD;  Location: [facility] OR;  Service: Orthopedics;  Laterality: Left;  Prior to Admission medications   Medication Sig Start Date End Date Taking? Authorizing Provider  allopurinol (ZYLOPRIM) 100 MG tablet Take 200 mg by mouth daily.    Yes [provider]  apixaban (ELIQUIS) 2.5 MG TABS tablet Take 1 tablet (2.5 mg total) by mouth 2 (two) times daily. 05/04/18  Yes Angelita Ingles, MD  carvedilol (COREG) 3.125 MG tablet Take 1 tablet (3.125 mg  total) by mouth 2 (two) times daily with a meal. 02/01/20  Yes Fenton, Clint R, PA  cyanocobalamin 1000 MCG tablet Take 1 tablet (1,000 mcg total) by mouth daily. 03/13/19  Yes Laurey Morale, MD  diclofenac sodium (VOLTAREN) 1 % GEL Apply 4 g topically 4 (four) times daily. 07/10/17  Yes Valentino Nose, MD  docusate sodium (COLACE) 100 MG capsule Take 100 mg by mouth 2 (two) times daily.   Yes [provider]  finasteride (PROSCAR) 5 MG tablet Take 1 tablet (5 mg total) by mouth daily. 11/29/18  Yes Alford Highland, NP  glipiZIDE (GLUCOTROL) 5 MG tablet Take 5 mg by mouth daily before breakfast.   Yes [provider]  hydrALAZINE (APRESOLINE) 50 MG tablet Take 1.5 tablets (75 mg total) by mouth 3 (three) times daily. 12/20/19  Yes Laurey Morale, MD  HYDROcodone-acetaminophen (NORCO/VICODIN) 5-325 MG tablet Take 2 tablets by mouth every 4 (four) hours as needed. 04/09/16  Yes Isa Rankin, MD  ipratropium-albuterol (DUONEB) 0.5-2.5 (3) MG/3ML SOLN Take 3 mLs by nebulization every 4 (four) hours as needed. Patient taking differently: Take 3 mLs by nebulization every 4 (four) hours as needed (wheezing/shortness of breath).  05/14/17  Yes Pearson Grippe, MD  isosorbide mononitrate (IMDUR) 30 MG 24 hr tablet Take 90 mg by mouth daily.   Yes [provider]  isosorbide mononitrate (IMDUR) 60 MG 24 hr tablet Take 1.5 tablets (90 mg total) by mouth daily. 12/20/19  Yes Laurey Morale, MD  levothyroxine (SYNTHROID) 125 MCG tablet Take 1 tablet (125 mcg total) by mouth daily before breakfast. 08/01/19  Yes Carlus Pavlov, MD  magnesium oxide (MAG-OX) 400 MG tablet Take 400 mg by mouth daily.   Yes [provider]  methocarbamol (ROBAXIN) 500 MG tablet Take 1 tablet (500 mg total) by mouth every 6 (six) hours as needed for muscle spasms. 03/15/19  Yes Masoudi, Elhamalsadat, MD  rosuvastatin (CRESTOR) 10 MG tablet Take 1 tablet (10 mg total) by mouth daily at 6 PM. 12/22/18  Yes  [name], MD  sertraline (ZOLOFT) 25 MG tablet Take 25 mg by mouth daily.    Yes [provider]  spironolactone (ALDACTONE) 25 MG tablet Take 1 tablet (25 mg total) by mouth daily. 04/13/19  Yes Bensimhon, Bevelyn Buckles, MD  tamsulosin (FLOMAX) 0.4 MG CAPS capsule Take 0.4 mg by mouth at bedtime.   Yes [provider]  torsemide (DEMADEX) 20 MG tablet Take 3 tablets (60 mg total) by mouth daily. 02/04/20  Yes Duke Salvia, MD    Current Facility-Administered Medications  Medication Dose Route Frequency Provider Last Rate Last Admin  . 0.9 %  sodium chloride infusion  250 mL Intravenous PRN Hillary Bow, DO      . acetaminophen (TYLENOL) tablet 650 mg  650 mg Oral Q4H PRN Hillary Bow, DO      . allopurinol (ZYLOPRIM) tablet 200 mg  200 mg Oral Daily Lyda Perone M, DO   200 mg at 02/14/20 0811  . amiodarone (PACERONE) tablet 400 mg  400 mg Oral BID Larey Dresser, MD   400 mg at 02/14/20 8280  . apixaban (ELIQUIS) tablet 2.5 mg  2.5 mg Oral BID Etta Quill, DO   2.5 mg at 02/14/20 0349  . bisacodyl (DULCOLAX) EC tablet 5 mg  5 mg Oral Daily PRN Radene Gunning, NP      . carvedilol (COREG) tablet 3.125 mg  3.125 mg Oral BID WC Jennette Kettle M, DO   3.125 mg at 02/13/20 1607  . diclofenac Sodium (VOLTAREN) 1 % topical gel 4 g  4 g Topical QID Jennette Kettle M, DO   4 g at 02/14/20 1791  . docusate sodium (COLACE) capsule 100 mg  100 mg Oral BID Etta Quill, DO   100 mg at 02/13/20 2148  . finasteride (PROSCAR) tablet 5 mg  5 mg Oral Daily Jennette Kettle M, DO   5 mg at 02/14/20 5056  . guaiFENesin-codeine 100-10 MG/5ML solution 5 mL  5 mL Oral Q6H PRN Gardiner Barefoot, NP   5 mL at 02/13/20 2157  . hydrALAZINE (APRESOLINE) tablet 25 mg  25 mg Oral TID Larey Dresser, MD      . HYDROcodone-acetaminophen (NORCO/VICODIN) 5-325 MG per tablet 2 tablet  2 tablet Oral Q4H PRN Etta Quill, DO   2 tablet at 02/13/20 2341  . insulin aspart (novoLOG)  injection 0-15 Units  0-15 Units Subcutaneous TID WC Etta Quill, DO   2 Units at 02/13/20 1626  . insulin aspart (novoLOG) injection 0-5 Units  0-5 Units Subcutaneous QHS Alcario Drought, Jared M, DO      . ipratropium-albuterol (DUONEB) 0.5-2.5 (3) MG/3ML nebulizer solution 3 mL  3 mL Nebulization Q4H PRN Etta Quill, DO      . [START ON 02/15/2020] isosorbide mononitrate (IMDUR) 24 hr tablet 30 mg  30 mg Oral Daily Larey Dresser, MD      . levothyroxine (SYNTHROID) tablet 125 mcg  125 mcg Oral QAC breakfast Etta Quill, DO   125 mcg at 02/14/20 9794  . magnesium oxide (MAG-OX) tablet 400 mg  400 mg Oral Daily Jennette Kettle M, DO   400 mg at 02/14/20 8016  . methocarbamol (ROBAXIN) tablet 500 mg  500 mg Oral Q6H PRN Etta Quill, DO      . ondansetron [facility]) injection 4 mg  4 mg Intravenous Q6H PRN Etta Quill, DO      . rosuvastatin (CRESTOR) tablet 10 mg  10 mg Oral q1800 Jennette Kettle M, DO   10 mg at 02/13/20 1607  . sertraline (ZOLOFT) tablet 25 mg  25 mg Oral Daily Jennette Kettle M, DO   25 mg at 02/14/20 5537  . sodium chloride flush (NS) 0.9 % injection 3 mL  3 mL Intravenous Q12H Jennette Kettle M, DO   3 mL at 02/14/20 0824  . sodium chloride flush (NS) 0.9 % injection 3 mL  3 mL Intravenous PRN Etta Quill, DO      . spironolactone (ALDACTONE) tablet 25 mg  25 mg Oral Daily Jennette Kettle M, DO   25 mg at 02/14/20 4827  . tamsulosin (FLOMAX) capsule 0.4 mg  0.4 mg Oral QHS Jennette Kettle M, DO   0.4 mg at 02/13/20 2147  . vitamin B-12 (CYANOCOBALAMIN) tablet 1,000 mcg  1,000 mcg Oral Daily Jennette Kettle M, DO   1,000 mcg at 02/14/20 0786    Allergies as of 02/11/2020 - Review Complete 02/11/2020  Allergen Reaction Noted  .  Lisinopril Swelling 10/11/2013  . Xarelto [rivaroxaban] Other (See Comments) 02/13/2018    Family History  Problem Relation Age of Onset  . Heart disease Mother   . Heart Problems Maternal Grandmother   . Other Other         negative for premature CAD    Social History   Socioeconomic History  . Marital status: Married    Spouse name: Not on file  . Number of children: Not on file  . Years of education: Not on file  . Highest education level: Not on file  Occupational History  . Occupation: Retired    Comment: Former Public house manager  Tobacco Use  . Smoking status: Former Smoker    Packs/day: 1.00    Years: 15.00    Pack years: 15.00    Types: Cigarettes, Cigars  . Smokeless tobacco: Former Neurosurgeon  . Tobacco comment: quit smoking ~ 50 yr ago  Substance and Sexual Activity  . Alcohol use: No    Alcohol/week: 2.0 standard drinks    Types: 2 Cans of beer per week    Comment: rare beer.  . Drug use: No  . Sexual activity: Yes  Other Topics Concern  . Not on file  Social History Narrative   Lives in [place] with wife. Moved from [place] (2013). Does not routinely exercise.   Social Determinants of Health   Financial Resource Strain:   . Difficulty of Paying Living Expenses:   Food Insecurity:   . Worried About Programme researcher, broadcasting/film/video in the Last Year:   . Barista in the Last Year:   Transportation Needs:   . Freight forwarder (Medical):   Marland Kitchen Lack of Transportation (Non-Medical):   Physical Activity:   . Days of Exercise per Week:   . Minutes of Exercise per Session:   Stress:   . Feeling of Stress :   Social Connections:   . Frequency of Communication with Friends and Family:   . Frequency of Social Gatherings with Friends and Family:   . Attends Religious Services:   . Active Member of Clubs or Organizations:   . Attends Banker Meetings:   Marland Kitchen Marital Status:   Intimate Partner Violence:   . Fear of Current or Ex-Partner:   . Emotionally Abused:   Marland Kitchen Physically Abused:   . Sexually Abused:     Review of Systems: As per HPI, all others negative  Physical Exam: Vital signs in last 24 hours: Temp:  [97.3 F (36.3 C)-98.2 F (36.8 C)] 97.3 F (36.3 C) (03/18  1256) Pulse Rate:  [68-103] 68 (03/18 1256) Resp:  [18] 18 (03/18 1256) BP: (82-97)/(53-76) 95/63 (03/18 1256) SpO2:  [94 %-97 %] 97 % (03/18 1256)   General:   Alert,  Elderly, overweight Head:  Normocephalic and atraumatic. Eyes:  Sclera clear, no icterus.   Conjunctiva pink. Ears:  Normal auditory acuity. Nose:  No deformity, discharge,  or lesions. Mouth:  No deformity or lesions.  Oropharynx pink & moist. Neck:  Supple; no masses or thyromegaly. Lungs:  Clear throughout to auscultation.   No wheezes, crackles, or rhonchi. No acute distress. Heart:  Regular rate and rhythm; no murmurs, clicks, rubs,  or gallops. Abdomen:  Soft, nontender and nondistended. No masses, hepatosplenomegaly or hernias noted. Normal bowel sounds, without guarding, and without rebound.     Msk:  Symmetrical without gross deformities. Normal posture. Pulses:  Normal pulses noted. Extremities:  Bilateral lower extremity edema Neurologic:  Alert  and  oriented x4;  grossly normal neurologically. Skin:  Intact without significant lesions or rashes. Cervical Nodes:  No significant cervical adenopathy. Psych:  Alert and cooperative. Normal mood and affect.   Lab Results: Recent Labs    02/13/20 0328 02/14/20 0536  WBC 6.0 5.4  HGB 8.6* 8.2*  HCT 27.1* 25.6*  PLT 273 236   BMET Recent Labs    02/12/20 0451 02/13/20 0328 02/14/20 0536  NA 139 140 137  K 4.0 4.4 4.5  CL 105 104 100  CO2 23 24 25   GLUCOSE 116* 127* 123*  BUN 53* 52* 56*  CREATININE 1.53* 1.46* 1.73*  CALCIUM 9.1 9.2 9.0   LFT No results for input(s): PROT, ALBUMIN, AST, ALT, ALKPHOS, BILITOT, BILIDIR, IBILI in the last 72 hours. PT/INR No results for input(s): LABPROT, INR in the last 72 hours.  Studies/Results: No results found.  Impression:  1.  Anemia. 2.  Hemoccult positive stool. 3.  Constipation, improving with senokot. 4.  Hematochezia x 1, in setting of #3 above. 5.  Heart failure exacerbation.  Plan:  1.   Treatment of constipation. 2.  Treatment of heart failure, per [facility] and cardiology consultants. 3.  Would not pursue endoscopic work-up at the present time; we will arrange outpatient follow-up with [place] at [facility] at which point we could further discuss whether or not to pursue colonoscopy +/- endoscopy. 4.  [facility] will sign-off; please call with questions; thank you for the consultation.   LOS: 2 days   Jaycub Noorani M  02/14/2020, 3:15 PM  Cell [phone] If no answer or after 5 PM call [phone]

## 2020-02-14 NOTE — Evaluation (Signed)
Physical Therapy Evaluation Patient Details Name: Ryan Macdonald MRN: [id number] DOB: [birth year] Today's Date: 02/14/2020   History of Present Illness  86 y.o. male with medical history significant of NICM EF 20-25% on TEE last month, AICD placement, PAF on eliquis had DCCV last month, h/o PE, DM2, cardiac stenting, and cardioversion 12/2019. Pt admitted to ED on 3/15 with CHF exacerbation.    Clinical Impression  Pt presents with generalized weakness, difficulty performing bed mobility tasks, increased time and effort to mobilize, decreased activity tolerance, and unsteadiness in standing. Pt to benefit from acute PT to address deficits. Pt ambulated short hallway distance with RW, limited by feeling short of breath but SpO2 94% on RA. HR remained 70s-80s during mobility, BP's see below. PT feels pt would benefit from HHPT, pt reports he does not need PT when he d/cs. PT to progress mobility as tolerated, and will continue to follow acutely.   BP, HR supine: 106/69(81), 72 bpm BP, HR sitting: 100/60(73), 75 bpm BP, HR post-ambulation: 107/77(86), 84 bpm   Follow Up Recommendations Home health PT;Supervision for mobility/OOB    Equipment Recommendations  None recommended by PT    Recommendations for Other Services       Precautions / Restrictions Precautions Precautions: Fall Restrictions Weight Bearing Restrictions: No      Mobility  Bed Mobility Overal bed mobility: Needs Assistance Bed Mobility: Supine to Sit;Sit to Supine     Supine to sit: HOB elevated;Min assist Sit to supine: Min assist;HOB elevated   General bed mobility comments: Min assist for LE lifting and translation to and from EOB, positioning shoulders and LEs in bed upon return to supine. Very increased time to perform.  Transfers Overall transfer level: Needs assistance Equipment used: Rolling walker (2 wheeled) Transfers: Sit to/from Stand Sit to Stand: From elevated surface;Min assist          General transfer comment: light min assist to steady upon standing, increased time to rise.  Ambulation/Gait Ambulation/Gait assistance: Min guard Gait Distance (Feet): 50 Feet Assistive device: Rolling walker (2 wheeled) Gait Pattern/deviations: Step-through pattern;Decreased stride length;Trunk flexed Gait velocity: decr   General Gait Details: Min guard for safety, very increased time to perform with forward flexed posture.  Stairs            Wheelchair Mobility    Modified Rankin (Stroke Patients Only)       Balance Overall balance assessment: Needs assistance Sitting-balance support: No upper extremity supported;Feet supported Sitting balance-Leahy Scale: Fair     Standing balance support: Bilateral upper extremity supported Standing balance-Leahy Scale: Poor Standing balance comment: reliant on external support in dynamic standing                             Pertinent Vitals/Pain Pain Assessment: Faces Faces Pain Scale: Hurts a little bit Pain Location: LEs Pain Descriptors / Indicators: Sore;Discomfort Pain Intervention(s): Monitored during session;Limited activity within patient's tolerance;Repositioned    Home Living Family/patient expects to be discharged to:: Private residence Living Arrangements: Spouse/significant other Available Help at Discharge: Family Type of Home: Apartment Home Access: Stairs to enter Entrance Stairs-Rails: Right Entrance Stairs-Number of Steps: 8 Home Layout: One level Home Equipment: Environmental consultant - 2 wheels;Cane - single point      Prior Function Level of Independence: Independent with assistive device(s)         Comments: pt reports using cane and RW for ambulation as needed, states he typically does not use  AD to ambulate. Pt able to perform ADLs for self.     Hand Dominance   Dominant Hand: Right    Extremity/Trunk Assessment   Upper Extremity Assessment Upper Extremity Assessment: Generalized  weakness    Lower Extremity Assessment Lower Extremity Assessment: Generalized weakness    Cervical / Trunk Assessment Cervical / Trunk Assessment: Normal  Communication   Communication: No difficulties  Cognition Arousal/Alertness: Awake/alert Behavior During Therapy: WFL for tasks assessed/performed Overall Cognitive Status: Within Functional Limits for tasks assessed                                 General Comments: Difficult to read pt's affect, at times appears to be joking with PT but at other times appears disgruntled at PT presence. Follows commands with increased time.      General Comments General comments (skin integrity, edema, etc.): bilateral unna boots    Exercises     Assessment/Plan    PT Assessment Patient needs continued PT services  PT Problem List Decreased strength;Decreased mobility;Decreased activity tolerance;Decreased balance;Decreased knowledge of use of DME;Pain;Cardiopulmonary status limiting activity       PT Treatment Interventions Therapeutic activities;DME instruction;Gait training;Therapeutic exercise;Patient/family education;Balance training;Stair training;Functional mobility training    PT Goals (Current goals can be found in the Care Plan section)  Acute Rehab PT Goals Patient Stated Goal: go home PT Goal Formulation: With patient Time For Goal Achievement: 02/28/20 Potential to Achieve Goals: Good    Frequency Min 3X/week   Barriers to discharge        Co-evaluation               AM-PAC PT "6 Clicks" Mobility  Outcome Measure Help needed turning from your back to your side while in a flat bed without using bedrails?: A Little Help needed moving from lying on your back to sitting on the side of a flat bed without using bedrails?: A Little Help needed moving to and from a bed to a chair (including a wheelchair)?: A Little Help needed standing up from a chair using your arms (e.g., wheelchair or bedside  chair)?: A Little Help needed to walk in hospital room?: A Little Help needed climbing 3-5 steps with a railing? : A Lot 6 Click Score: 17    End of Session Equipment Utilized During Treatment: Gait belt Activity Tolerance: Patient tolerated treatment well;Patient limited by fatigue Patient left: in bed;with call bell/phone within reach;with family/visitor present Nurse Communication: Mobility status PT Visit Diagnosis: Other abnormalities of gait and mobility (R26.89);Muscle weakness (generalized) (M62.81)    Time: 1420-1450 PT Time Calculation (min) (ACUTE ONLY): 30 min   Charges:   PT Evaluation $PT Eval Low Complexity: 1 Low PT Treatments $Gait Training: 8-22 mins       Takyia Sindt E, PT Acute Rehabilitation Services Pager [phone]  Office [phone]  Darry Kelnhofer D Despina Hidden 02/14/2020, 4:29 PM

## 2020-02-14 NOTE — Progress Notes (Signed)
Progress Note    Ryan Macdonald  PJK:[id number] DOB: November 02, 1934  DOA: 02/11/2020 PCP: [facility]    Brief Narrative:   Chief complaint: LE edema/sob  Medical records reviewed and are as summarized below:  Ryan Macdonald is an 86 y.o. male past medical history that includes nonischemic cardiomyopathy with an EF of 20%, AICD placement recently, PAF on Eliquis, PE, diabetes, hypertension, obesity presented to the emergency department arch 15 chief complaint bilateral lower extremity edema likely related to noncompliance with diuretics.  Work-up in the emergency department consistent with acute on chronic heart failure and A. fib with RVR.  He was evaluated by cardiology who increased his Lasix started Imdur as well as amiodarone.  His blood pressure was little soft and creatinine trending up a little.   Assessment/Plan:   Principal Problem:   Acute on chronic heart failure (HCC) Active Problems:   Essential hypertension   Obesity (BMI 30-39.9)   Atrial fibrillation (HCC)   Chronic renal insufficiency, stage 3 (moderate) (HCC)   Diabetes mellitus type 2 in obese (HCC)   Constipation   Anemia   Morbid obesity (HCC)   #1.  Acute on chronic systolic heart failure.  Primarily nonischemic cardiomyopathy per cardiology.  Has diuresed over 2 L today.  Net volume status -1.2 L.  Weight 226.6 from 233.9.  IV Lasix has been managed by cardiology.  They opine he is diuresing well even though his creatinine is trending up slightly.  We will hold his afternoon dose of Lasix due to a soft blood pressure and slightly worsening creatinine.  They also plan to decrease his Imdur and hydralazine -Continue Coreg -Monitor closely -Hopefully can transition to p.o. diuretics tomorrow -Appreciate cardiology assistance  #2.  Hypertension.  Blood pressure is quite soft today.  Lasix Imdur hydralazine all adjusted by cardiology -Monitor closely  #3.  Atrial fibrillation.  Patient had an  episode of A. fib with RVR on admission.  He has been in A. fib at a rate of 100-110.  Yesterday was started on amiodarone.  Converted to normal sinus. -Continue Eliquis -Continue amiodarone -Continue Coreg -Monitor  #4.  Chronic renal insufficiency stage III.  Creatinine trending up a little bit today.  Likely related to the Lasix. -Hold nephrotoxins as able -Monitor urine output -Recheck in the morning  #5.  Anemia.  Hemoglobin drifting down somewhat.  Currently hemoglobin 8.2 down from 10 5 months ago.  Is on a blood thinner.  FOBT positive.  Anemia panel consistent with iron deficiency anemia. Had [facility] 3/17.  Has had a colonoscopy in the past but cannot remember when or when the results were.  -GI consult -Monitor  #6.  Diabetes.  Controlled.  Hemoglobin A1c 5.7.  Home medications include lipid Zide. -Continue to hold glipizide -Sliding scale insulin for optimal control  #7.  Constipation.  Patient received 10 mg of Dulcolax yesterday with good results. -Monitor -As needed Dulcolax   Family Communication/Anticipated D/C date and plan/Code Status   DVT prophylaxis: eliquis Code Status: Full Code.  Family Communication: wife at bedside Disposition Plan: home when blood pressure stabilized, when creatinine stabilized and anemia evaluated   Medical Consultants:    [facility] cardiology   Anti-Infectives:    None  Subjective:   Walking in room with walker with steady gait smiling in no acute distress.  Denies pain/discomfort  Objective:    Vitals:   02/13/20 2126 02/13/20 2335 02/14/20 0523 02/14/20 1256  BP: 97/70 94/72 (!) 82/53 95/63  Pulse: (!) 103 86 80 68  Resp:    18  Temp: 98.2 F (36.8 C)  97.6 F (36.4 C) (!) 97.3 F (36.3 C)  TempSrc: Oral  Oral Oral  SpO2: 95% 95% 94% 97%  Weight:      Height:        Intake/Output Summary (Last 24 hours) at 02/14/2020 1351 Last data filed at 02/14/2020 1223 Gross per 24 hour  Intake 1240 ml  Output 2450 ml   Net -1210 ml   Filed Weights   02/11/20 2215 02/12/20 0522 02/13/20 0500  Weight: 106.1 kg 105.1 kg 102.8 kg    Exam: General: Obese awake alert no acute distress CV: Regular rate and rhythm no murmur gallop or rub 1+ lower extremity edema with Unna boots intact Respiratory: No increased work of breathing breath sounds are distant but clear I hear no crackles no wheeze Abdomen: Obese soft positive bowel sounds throughout nontender to palpation no mass organomegaly noted Musculoskeletal: Joints without swelling/erythema Unna boots intact Neuro: Alert and oriented x3 speech clear facial symmetry cranial nerves II through XII grossly intact  Data Reviewed:   I have personally reviewed following labs and imaging studies:  Labs: Labs show the following:   Basic Metabolic Panel: Recent Labs  Lab 02/11/20 1424 02/11/20 1424 02/12/20 0451 02/12/20 0451 02/13/20 0328 02/14/20 0536  NA 138  --  139  --  140 137  K 4.8   < > 4.0   < > 4.4 4.5  CL 107  --  105  --  104 100  CO2 20*  --  23  --  24 25  GLUCOSE 93  --  116*  --  127* 123*  BUN 56*  --  53*  --  52* 56*  CREATININE 1.62*  --  1.53*  --  1.46* 1.73*  CALCIUM 8.9  --  9.1  --  9.2 9.0   < > = values in this interval not displayed.   GFR Estimated Creatinine Clearance: 37 mL/min (A) (by C-G formula based on SCr of 1.73 mg/dL (H)). Liver Function Tests: No results for input(s): AST, ALT, ALKPHOS, BILITOT, PROT, ALBUMIN in the last 168 hours. No results for input(s): LIPASE, AMYLASE in the last 168 hours. No results for input(s): AMMONIA in the last 168 hours. Coagulation profile Recent Labs  Lab 02/11/20 1424  INR 1.2    CBC: Recent Labs  Lab 02/11/20 1424 02/13/20 0328 02/14/20 0536  WBC 7.4 6.0 5.4  HGB 8.8* 8.6* 8.2*  HCT 28.6* 27.1* 25.6*  MCV 92.6 90.9 90.5  PLT 267 273 236   Cardiac Enzymes: No results for input(s): CKTOTAL, CKMB, CKMBINDEX, TROPONINI in the last 168 hours. BNP (last 3  results) No results for input(s): PROBNP in the last 8760 hours. CBG: Recent Labs  Lab 02/13/20 1106 02/13/20 1604 02/13/20 2123 02/14/20 0626 02/14/20 1050  GLUCAP 118* 125* 125* 120* 118*   D-Dimer: No results for input(s): DDIMER in the last 72 hours. Hgb A1c: Recent Labs    02/11/20 2015  HGBA1C 5.7*   Lipid Profile: No results for input(s): CHOL, HDL, LDLCALC, TRIG, CHOLHDL, LDLDIRECT in the last 72 hours. Thyroid function studies: No results for input(s): TSH, T4TOTAL, T3FREE, THYROIDAB in the last 72 hours.  Invalid input(s): FREET3 Anemia work up: Recent Labs    02/13/20 0328  VITAMINB12 2,064*  FOLATE 8.8  FERRITIN 34  TIBC 426  IRON 31*  RETICCTPCT 2.5   Sepsis Labs: Recent Labs  Lab 02/11/20 1424 02/13/20 0328 02/14/20 0536  WBC 7.4 6.0 5.4    Microbiology Recent Results (from the past 240 hour(s))  SARS CORONAVIRUS 2 (TAT 6-24 HRS) Nasopharyngeal Nasopharyngeal Swab     Status: None   Collection Time: 02/11/20  6:09 PM   Specimen: Nasopharyngeal Swab  Result Value Ref Range Status   SARS Coronavirus 2 NEGATIVE NEGATIVE Final    Comment: (NOTE) SARS-CoV-2 target nucleic acids are NOT DETECTED. The SARS-CoV-2 RNA is generally detectable in upper and lower respiratory specimens during the acute phase of infection. Negative results do not preclude SARS-CoV-2 infection, do not rule out co-infections with other pathogens, and should not be used as the sole basis for treatment or other patient management decisions. Negative results must be combined with clinical observations, patient history, and epidemiological information. The expected result is Negative. Fact Sheet for Patients: HairSlick.no Fact Sheet for Healthcare Providers: quierodirigir.com This test is not yet approved or cleared by the [place] FDA and  has been authorized for detection and/or diagnosis of SARS-CoV-2 by FDA  under an Emergency Use Authorization (EUA). This EUA will remain  in effect (meaning this test can be used) for the duration of the COVID-19 declaration under Section 56 4(b)(1) of the Act, 21 U.S.C. section 360bbb-3(b)(1), unless the authorization is terminated or revoked sooner. Performed at [facility], [address] [place], [place] [address]     Procedures and diagnostic studies:  No results found.  Medications:   . allopurinol  200 mg Oral Daily  . amiodarone  400 mg Oral BID  . apixaban  2.5 mg Oral BID  . carvedilol  3.125 mg Oral BID WC  . diclofenac Sodium  4 g Topical QID  . docusate sodium  100 mg Oral BID  . finasteride  5 mg Oral Daily  . hydrALAZINE  25 mg Oral TID  . insulin aspart  0-15 Units Subcutaneous TID WC  . insulin aspart  0-5 Units Subcutaneous QHS  . [START ON 02/15/2020] isosorbide mononitrate  30 mg Oral Daily  . levothyroxine  125 mcg Oral QAC breakfast  . magnesium oxide  400 mg Oral Daily  . rosuvastatin  10 mg Oral q1800  . sertraline  25 mg Oral Daily  . sodium chloride flush  3 mL Intravenous Q12H  . spironolactone  25 mg Oral Daily  . tamsulosin  0.4 mg Oral QHS  . cyanocobalamin  1,000 mcg Oral Daily   Continuous Infusions: . sodium chloride       LOS: 2 days   Gwenyth Bender NP  [facility]   How to contact the [facility] Attending or Consulting provider 7A - 7P or covering provider during after hours 7P -7A, for this patient?  1. Check the care team in [facility] and look for a) attending/consulting TR[facility] provider listed and b) the [facility] team listed 2. Log into [email or web address] and use [facility] universal password to access. If you do not have the password, please contact the hospital operator. 3. Locate the [facility] provider you are looking for under [facility] and page to a number that you can be directly reached. 4. If you still have difficulty reaching the provider, please page the [facility] (Director on Call) for the Hospitalists  listed on amion for assistance.  02/14/2020, 1:51 PM

## 2020-02-14 NOTE — Progress Notes (Addendum)
Patient ID: Ryan Macdonald, male   DOB: 1933-12-13, 86 y.o.   MRN: [id number]     Advanced Heart Failure Rounding Note  PCP-Cardiologist: [name], MD   Subjective:    Breathing improving.  I/Os negative and weight coming down.  Creatinine up to 1.73 today.  BP has been low today, SBP 80s-90s.     He has converted to NSR on amiodarone.    Objective:   Weight Range: 102.8 kg Body mass index is 35.5 kg/m.   Vital Signs:   Temp:  [97.6 F (36.4 C)-98.2 F (36.8 C)] 97.6 F (36.4 C) (03/18 0523) Pulse Rate:  [80-105] 80 (03/18 0523) Resp:  [16-18] 18 (03/17 1802) BP: (82-100)/(53-76) 82/53 (03/18 0523) SpO2:  [94 %-95 %] 94 % (03/18 0523)    Weight change: Filed Weights   02/11/20 2215 02/12/20 0522 02/13/20 0500  Weight: 106.1 kg 105.1 kg 102.8 kg    Intake/Output:   Intake/Output Summary (Last 24 hours) at 02/14/2020 1036 Last data filed at 02/14/2020 0816 Gross per 24 hour  Intake 1230 ml  Output 2450 ml  Net -1220 ml      Physical Exam    General: NAD Neck: JVP difficult, ?8 range. No thyromegaly or thyroid nodule.  Lungs: Clear to auscultation bilaterally with normal respiratory effort. CV: Nondisplaced PMI.  Heart regular S1/S2, no S3/S4, no murmur.  1-2+ edema to knees, legs wrapped.   Abdomen: Soft, nontender, no hepatosplenomegaly, no distention.  Skin: Intact without lesions or rashes.  Neurologic: Alert and oriented x 3.  Psych: Normal affect. Extremities: No clubbing or cyanosis.  HEENT: Normal.    Telemetry   NSR 70s (personally reviewed)  Labs    CBC Recent Labs    02/13/20 0328 02/14/20 0536  WBC 6.0 5.4  HGB 8.6* 8.2*  HCT 27.1* 25.6*  MCV 90.9 90.5  PLT 273 236   Basic Metabolic Panel Recent Labs    82/42/35 0328 02/14/20 0536  NA 140 137  K 4.4 4.5  CL 104 100  CO2 24 25  GLUCOSE 127* 123*  BUN 52* 56*  CREATININE 1.46* 1.73*  CALCIUM 9.2 9.0   Liver Function Tests No results for input(s): AST, ALT,  ALKPHOS, BILITOT, PROT, ALBUMIN in the last 72 hours. No results for input(s): LIPASE, AMYLASE in the last 72 hours. Cardiac Enzymes No results for input(s): CKTOTAL, CKMB, CKMBINDEX, TROPONINI in the last 72 hours.  BNP: BNP (last 3 results) Recent Labs    02/11/20 1739  BNP 1,569.0*    ProBNP (last 3 results) No results for input(s): PROBNP in the last 8760 hours.   D-Dimer No results for input(s): DDIMER in the last 72 hours. Hemoglobin A1C Recent Labs    02/11/20 2015  HGBA1C 5.7*   Fasting Lipid Panel No results for input(s): CHOL, HDL, LDLCALC, TRIG, CHOLHDL, LDLDIRECT in the last 72 hours. Thyroid Function Tests No results for input(s): TSH, T4TOTAL, T3FREE, THYROIDAB in the last 72 hours.  Invalid input(s): FREET3  Other results:   Imaging    No results found.   Medications:     Scheduled Medications: . allopurinol  200 mg Oral Daily  . amiodarone  400 mg Oral BID  . apixaban  2.5 mg Oral BID  . carvedilol  3.125 mg Oral BID WC  . diclofenac Sodium  4 g Topical QID  . docusate sodium  100 mg Oral BID  . finasteride  5 mg Oral Daily  . furosemide  80 mg Intravenous BID  .  hydrALAZINE  25 mg Oral TID  . insulin aspart  0-15 Units Subcutaneous TID WC  . insulin aspart  0-5 Units Subcutaneous QHS  . [START ON 02/15/2020] isosorbide mononitrate  30 mg Oral Daily  . levothyroxine  125 mcg Oral QAC breakfast  . magnesium oxide  400 mg Oral Daily  . rosuvastatin  10 mg Oral q1800  . sertraline  25 mg Oral Daily  . sodium chloride flush  3 mL Intravenous Q12H  . spironolactone  25 mg Oral Daily  . tamsulosin  0.4 mg Oral QHS  . cyanocobalamin  1,000 mcg Oral Daily    Infusions: . sodium chloride      PRN Medications: sodium chloride, acetaminophen, bisacodyl, guaiFENesin-codeine, HYDROcodone-acetaminophen, ipratropium-albuterol, methocarbamol, ondansetron (ZOFRAN) IV, sodium chloride flush   Assessment/Plan   1. Acute on chronic systolic CHF:  Primarily nonischemic cardiomyopathy. EF has been low since at least 2014.  He has single chamber Biotronik ICD. QRS not wide enough for CRT.Settings adjusted in the past to prevent RV pacing in the face of a long 1st degree AV block.  TEE in 2/21 with EF 20-25%, diffuse hypokinesis and mild LVH, mild RV systolic dysfunction. He was admitted after taking the wrong meds (no diuretic) for a number of days and developing volume overload.  He has converted to NSR on amiodarone.  BP lower today.  He diuresed well yesterday.  Creatinine up to 1.73.  Exam somewhat difficult for volume but suspect he still has some volume overload.  - He got a dose of 80 mg IV Lasix already today. With creatinine rise and low BP, hold further Lasix today and reassess creatinine tomorrow.  - Will check REDS clip today => 35%, can likely start po diuretics tomorrow if creatinine stable.  - Continue spironolactone 25 mg daily - With soft BP, cut back on hydralazine to 25 mg tid and Imdur to 30 mg daily. Bidil not covered by [facility].   -Continue Coreg 3.125 mg bid.  -No ACEI or ARNI with angioedema on lisinopril. - Would hold off on ARB with CKD stage 3 and h/o AKI.  2. CAD: S/p PCI 07/2018 to pLAD but CAD does not explain his cardiomyopathy.  No chest pain.  - He is off Plavix now, on Eliquis for his atrial fibrillation.  - Continue Crestor 3.Atrial fibrillation: He had been in atrial fibrillation initially this admission but now back in NSR on amiodarone.   - Continue Eliquis 2.5 mg bid (age, creatinine > 1.5).  - Continue amiodarone.  He has history of hypothyroidism with amiodarone in the past but is on Levoxyl.  4. AKI on CKD stage 3: As above, hold afternoon Lasix dose and work on getting BP to trend up.  5. Hypothyroidism: Suspect this is due to amiodarone.  He has been on Levoxyl.  6. Anemia: Fe deficiency with FOBT+.  He denies overt bleeding.  He has had feraheme.  Hgb lower today at 8.2.  - Would be reasonable to  involve GI with FOBT+.  If needed scopes we could hold his Eliquis.   Length of Stay: 2  Loralie Champagne, MD  02/14/2020, 10:36 AM  Advanced Heart Failure Team Pager [phone] (M-F; 7a - 4p)  Please contact [facility] for night-coverage after hours (4p -7a ) and weekends on [email or web address]

## 2020-02-14 NOTE — Plan of Care (Signed)
Plan of care reviewed with pt at bedside. Pt cooperative. Sat in chair for 4 hrs. Unaboots in place. BP soft, PA aware. NSR on telemetry. Call bell in reach. Safety measures in place. Will continue to monitor pt.  Problem: Education: Goal: Knowledge of General Education information will improve Description: Including pain rating scale, medication(s)/side effects and non-pharmacologic comfort measures Outcome: Progressing   Problem: Health Behavior/Discharge Planning: Goal: Ability to manage health-related needs will improve Outcome: Progressing   Problem: Clinical Measurements: Goal: Ability to maintain clinical measurements within normal limits will improve Outcome: Progressing Goal: Will remain free from infection Outcome: Progressing Goal: Diagnostic test results will improve Outcome: Progressing Goal: Respiratory complications will improve Outcome: Progressing Goal: Cardiovascular complication will be avoided Outcome: Progressing   Problem: Activity: Goal: Risk for activity intolerance will decrease Outcome: Progressing   Problem: Nutrition: Goal: Adequate nutrition will be maintained Outcome: Progressing   Problem: Coping: Goal: Level of anxiety will decrease Outcome: Progressing   Problem: Elimination: Goal: Will not experience complications related to bowel motility Outcome: Progressing Goal: Will not experience complications related to urinary retention Outcome: Progressing   Problem: Pain Managment: Goal: General experience of comfort will improve Outcome: Progressing   Problem: Safety: Goal: Ability to remain free from injury will improve Outcome: Progressing   Problem: Skin Integrity: Goal: Risk for impaired skin integrity will decrease Outcome: Progressing

## 2020-02-15 DIAGNOSIS — N1832 Chronic kidney disease, stage 3b: Secondary | ICD-10-CM

## 2020-02-15 DIAGNOSIS — R319 Hematuria, unspecified: Secondary | ICD-10-CM | POA: Diagnosis not present

## 2020-02-15 LAB — CBC
HCT: 25.9 % — ABNORMAL LOW (ref 39.0–52.0)
Hemoglobin: 8.2 g/dL — ABNORMAL LOW (ref 13.0–17.0)
MCH: 28.7 pg (ref 26.0–34.0)
MCHC: 31.7 g/dL (ref 30.0–36.0)
MCV: 90.6 fL (ref 80.0–100.0)
Platelets: 239 10*3/uL (ref 150–400)
RBC: 2.86 MIL/uL — ABNORMAL LOW (ref 4.22–5.81)
RDW: 16.1 % — ABNORMAL HIGH (ref 11.5–15.5)
WBC: 7 10*3/uL (ref 4.0–10.5)
nRBC: 0.3 % — ABNORMAL HIGH (ref 0.0–0.2)

## 2020-02-15 LAB — BASIC METABOLIC PANEL
Anion gap: 13 (ref 5–15)
BUN: 62 mg/dL — ABNORMAL HIGH (ref 8–23)
CO2: 23 mmol/L (ref 22–32)
Calcium: 9.3 mg/dL (ref 8.9–10.3)
Chloride: 101 mmol/L (ref 98–111)
Creatinine, Ser: 1.94 mg/dL — ABNORMAL HIGH (ref 0.61–1.24)
GFR calc Af Amer: 36 mL/min — ABNORMAL LOW (ref >60)
GFR calc non Af Amer: 31 mL/min — ABNORMAL LOW (ref >60)
Glucose, Bld: 121 mg/dL — ABNORMAL HIGH (ref 70–99)
Potassium: 4.5 mmol/L (ref 3.5–5.1)
Sodium: 137 mmol/L (ref 135–145)

## 2020-02-15 LAB — URINALYSIS, ROUTINE W REFLEX MICROSCOPIC

## 2020-02-15 LAB — URINALYSIS, MICROSCOPIC (REFLEX)
RBC / HPF: >50 RBC/hpf (ref 0–5)
WBC, UA: >50 WBC/hpf (ref 0–5)

## 2020-02-15 LAB — GLUCOSE, CAPILLARY
Glucose-Capillary: 112 mg/dL — ABNORMAL HIGH (ref 70–99)
Glucose-Capillary: 133 mg/dL — ABNORMAL HIGH (ref 70–99)
Glucose-Capillary: 111 mg/dL — ABNORMAL HIGH (ref 70–99)
Glucose-Capillary: 153 mg/dL — ABNORMAL HIGH (ref 70–99)

## 2020-02-15 MED ORDER — HYDRALAZINE HCL 25 MG PO TABS
25.0000 mg | ORAL_TABLET | Freq: Three times a day (TID) | ORAL | Status: DC
  Administered 2020-02-15 – 2020-02-16 (×4): 25 mg via ORAL
  Filled 2020-02-15 (×3): qty 1

## 2020-02-15 MED ORDER — TORSEMIDE 20 MG PO TABS
60.0000 mg | ORAL_TABLET | Freq: Every day | ORAL | Status: DC
  Administered 2020-02-15: 60 mg via ORAL
  Filled 2020-02-15 (×2): qty 3

## 2020-02-15 NOTE — TOC Progression Note (Addendum)
Transition of Care [facility]) - Progression Note    Patient Details  Name: Ryan Macdonald MRN: [id number] Date of Birth: 09/13/1934  [facility] CM/SW Contact  Nadene Rubins Adria Devon, RN Phone Number: 02/15/2020, 11:05 AM  Clinical Narrative:     See prior note.   Patient from home with wife.   Provided Medicare.gov list. Patient has had [facility] in the past and would like them again. Called Tiffany with [facility] and she accept referral for HHPT . Have messaged MD regarding Henriette Combs frequency changing at home   Will need orders   [facility] RN cannot not make home visit until Tuesday. Not sure when discharge is, Have asked NP if unna boots can be changed prior to hospital discharge.   Expected Discharge Plan: Home w Home Health Services Barriers to Discharge: Continued Medical Work up  Expected Discharge Plan and Services Expected Discharge Plan: Home w Home Health Services   Discharge Planning Services: CM Consult Post Acute Care Choice: Home Health Living arrangements for the past 2 months: Single Family Home                 DME Arranged: N/A DME Agency: NA       HH Arranged: PT HH [facility]: [facility] Date HH Agency Contacted: 02/15/20 Time HH Agency Contacted: 1105 Representative spoke with at [facility] Agency: Tiffany   Social Determinants of Health (SDOH) Interventions    Readmission Risk Interventions No flowsheet data found.

## 2020-02-15 NOTE — Progress Notes (Signed)
Patient ID: Ryan Macdonald, male   DOB: Apr 24, 1934, 86 y.o.   MRN: [id number]     Advanced Heart Failure Rounding Note  PCP-Cardiologist: Skeet Latch, MD   Subjective:    Still some dyspnea.  IV Lasix stopped yesterday.  Creatinine up to 1.94 today.  SBP 100s-110s.   He has converted to NSR on amiodarone.   Hematuria noted.  He denies melena/BRBPR.  Hgb stable at 8.2.  GI saw and plan outpatient workup.    Objective:   Weight Range: 102.8 kg Body mass index is 35.5 kg/m.   Vital Signs:   Temp:  [97.3 F (36.3 C)-98.7 F (37.1 C)] 97.7 F (36.5 C) (03/19 0830) Pulse Rate:  [68-98] 68 (03/19 0830) Resp:  [16-18] 18 (03/19 0830) BP: (95-113)/(62-70) 113/70 (03/19 0830) SpO2:  [93 %-97 %] 97 % (03/19 0830) Last BM Date: 02/14/20  Weight change: Filed Weights   02/11/20 2215 02/12/20 0522 02/13/20 0500  Weight: 106.1 kg 105.1 kg 102.8 kg    Intake/Output:   Intake/Output Summary (Last 24 hours) at 02/15/2020 1138 Last data filed at 02/15/2020 1100 Gross per 24 hour  Intake 610 ml  Output 450 ml  Net 160 ml      Physical Exam    General: NAD Neck: JVP 10-12 cm, no thyromegaly or thyroid nodule.  Lungs: Clear to auscultation bilaterally with normal respiratory effort. CV: Nondisplaced PMI.  Heart regular S1/S2, no S3/S4, no murmur.  2+ edema 1/2 to knees bilaterally.   Abdomen: Soft, nontender, no hepatosplenomegaly, no distention.  Skin: Intact without lesions or rashes.  Neurologic: Alert and oriented x 3.  Psych: Normal affect. Extremities: No clubbing or cyanosis.  HEENT: Normal.    Telemetry   NSR 70s (personally reviewed)  Labs    CBC Recent Labs    02/14/20 0536 02/15/20 0356  WBC 5.4 7.0  HGB 8.2* 8.2*  HCT 25.6* 25.9*  MCV 90.5 90.6  PLT 236 409   Basic Metabolic Panel Recent Labs    02/14/20 0536 02/15/20 0356  NA 137 137  K 4.5 4.5  CL 100 101  CO2 25 23  GLUCOSE 123* 121*  BUN 56* 62*  CREATININE 1.73* 1.94*  CALCIUM  9.0 9.3   Liver Function Tests No results for input(s): AST, ALT, ALKPHOS, BILITOT, PROT, ALBUMIN in the last 72 hours. No results for input(s): LIPASE, AMYLASE in the last 72 hours. Cardiac Enzymes No results for input(s): CKTOTAL, CKMB, CKMBINDEX, TROPONINI in the last 72 hours.  BNP: BNP (last 3 results) Recent Labs    02/11/20 1739  BNP 1,569.0*    ProBNP (last 3 results) No results for input(s): PROBNP in the last 8760 hours.   D-Dimer No results for input(s): DDIMER in the last 72 hours. Hemoglobin A1C No results for input(s): HGBA1C in the last 72 hours. Fasting Lipid Panel No results for input(s): CHOL, HDL, LDLCALC, TRIG, CHOLHDL, LDLDIRECT in the last 72 hours. Thyroid Function Tests No results for input(s): TSH, T4TOTAL, T3FREE, THYROIDAB in the last 72 hours.  Invalid input(s): FREET3  Other results:   Imaging    No results found.   Medications:     Scheduled Medications: . allopurinol  200 mg Oral Daily  . amiodarone  400 mg Oral BID  . apixaban  2.5 mg Oral BID  . carvedilol  3.125 mg Oral BID WC  . diclofenac Sodium  4 g Topical QID  . docusate sodium  100 mg Oral BID  . finasteride  5  mg Oral Daily  . hydrALAZINE  25 mg Oral TID  . insulin aspart  0-15 Units Subcutaneous TID WC  . insulin aspart  0-5 Units Subcutaneous QHS  . isosorbide mononitrate  30 mg Oral Daily  . levothyroxine  125 mcg Oral QAC breakfast  . magnesium oxide  400 mg Oral Daily  . rosuvastatin  10 mg Oral q1800  . sertraline  25 mg Oral Daily  . sodium chloride flush  3 mL Intravenous Q12H  . spironolactone  25 mg Oral Daily  . tamsulosin  0.4 mg Oral QHS  . torsemide  60 mg Oral Daily  . cyanocobalamin  1,000 mcg Oral Daily    Infusions: . sodium chloride      PRN Medications: sodium chloride, acetaminophen, bisacodyl, guaiFENesin-codeine, HYDROcodone-acetaminophen, ipratropium-albuterol, methocarbamol, ondansetron (ZOFRAN) IV, sodium chloride  flush   Assessment/Plan   1. Acute on chronic systolic CHF: Primarily nonischemic cardiomyopathy. EF has been low since at least 2014.  He has single chamber Biotronik ICD. QRS not wide enough for CRT.Settings adjusted in the past to prevent RV pacing in the face of a long 1st degree AV block.  TEE in 2/21 with EF 20-25%, diffuse hypokinesis and mild LVH, mild RV systolic dysfunction. He was admitted after taking the wrong meds (no diuretic) for a number of days and developing volume overload.  He has converted to NSR on amiodarone.  Hydralazine cut back with soft BP, now SBP 100s-110s.  IV Lasix stopped after yesterday morning, creatinine up some to 1.94.  On exam, he does still look volume overloaded.  Overall, weight has trended down.  - Will start back on torsemide 60 mg daily today, follow I/Os and creatinine carefully.   - Continue spironolactone 25 mg daily - With soft BP, have cut back on hydralazine to 25 mg tid and Imdur to 30 mg daily. Bidil not covered by [facility].   -Continue Coreg 3.125 mg bid.  -No ACEI or ARNI with angioedema on lisinopril. - Would hold off on ARB with CKD stage 3 and h/o AKI.  2. CAD: S/p PCI 07/2018 to pLAD but CAD does not explain his cardiomyopathy.  No chest pain.  - He is off Plavix now, on Eliquis for his atrial fibrillation.  - Continue Crestor 3.Atrial fibrillation: He had been in atrial fibrillation initially this admission but now back in NSR on amiodarone.   - Continue Eliquis 2.5 mg bid (age, creatinine > 1.5).  - Continue amiodarone.  He has history of hypothyroidism with amiodarone in the past but is on Levoxyl.  4. AKI on CKD stage 3: As above, hold afternoon Lasix dose and work on getting BP to trend up.  5. Hypothyroidism: Suspect this is due to amiodarone.  He has been on Levoxyl.  6. Anemia: Fe deficiency with FOBT+.  He denies overt bleeding.  He has had feraheme.  Hgb low at 8.2.  GI saw him and plan outpatient workup.  7. Hematuria: Will get  urinalysis and culture today.   Length of Stay: 3  [name], MD  02/15/2020, 11:38 AM  Advanced Heart Failure Team Pager [phone] (M-F; 7a - 4p)  Please contact [facility] for night-coverage after hours (4p -7a ) and weekends on [email or web address]

## 2020-02-15 NOTE — Progress Notes (Signed)
Progress Note    Ryan Macdonald  XHB:[id number] DOB: Jan 13, 1934  DOA: 02/11/2020 PCP: [facility]    Brief Narrative:   Chief complaint: Lower extremity edema/shortness of breath  Medical records reviewed and are as summarized below:  Ryan Macdonald is an 86 y.o. male has medical history that includes nonischemic cardiomyopathy, AICD recently placed, PAF on Eliquis, PE, diabetes, hypertension, obesity presented to the emergency department March 15 chief complaint bilateral lower extremity edema likely related noncompliance with diuretics.  Work-up in the emergency department consistent with acute on chronic heart failure and A. fib with RVR.  He was evaluated by cardiology who increased his Lasix started Imdur as well as amiodarone.  His blood pressure became a little soft creatinine trended up.  Diuretics were held for half a day and antihypertensive medications were adjusted.  Diuretics converted to p.o. the next day.  Remains volume overloaded with some improvement.  Cardiology recommend starting torsemide and following intake and output and creatinine carefully  Assessment/Plan:   Principal Problem:   Acute on chronic heart failure (HCC) Active Problems:   Essential hypertension   Hematuria   Obesity (BMI 30-39.9)   Atrial fibrillation (HCC)   Chronic renal insufficiency, stage 3 (moderate) (HCC)   Diabetes mellitus type 2 in obese (HCC)   Constipation   Anemia   Morbid obesity (HCC)  #1.  Acute on chronic systolic heart failure.  Primarily nonischemic cardiomyopathy per cardiology.    Net volume status -1.2 L.  Weight 226.6 from 233.9.   Lasix held yesterday afternoon due to soft blood pressure.  Evaluated by cardiology who opine remains volume overloaded.  Recommend p.o. diuretics and close monitoring of intake and output and creatinine. -Continue Coreg -Continue spironolactone -Start torsemide -No ACE inhibitor or ARN I with angioedema on lisinopril -Monitor  closely -Hopefully can transition to p.o. diuretics tomorrow -Appreciate cardiology assistance  #2.  Hypertension.  Blood pressure remains somewhat soft.  Imdur hydralazine all adjusted by cardiology.  Lasix discontinued yesterday.  Torsemide started today -Torsemide per cardiology -Continue spironolactone -Monitor closely  #3.  Atrial fibrillation.  Patient had an episode of A. fib with RVR on admission.  He has been in A. fib at a rate of 100-110.  Yesterday was started on amiodarone.  Converted to normal sinus. -hold Eliquis due to hematuria -Continue amiodarone -Continue Coreg -Monitor  #4.  Chronic renal insufficiency stage III.  Creatinine trending up again today. Likely related to the Lasix in setting of soft blood pressure. This was discontinued yesterday afternoon as noted above -Hold nephrotoxins as able -Monitor urine output -Recheck in the morning  #5.  Anemia/+FOBT/hematuria.  Hemoglobin stable at 8.2. Home meds include Eliquis.  FOBT positive and urinalysis with +RBC, >50 WBC.  Anemia panel consistent with iron deficiency anemia. Had [facility] 3/17.  Has had a colonoscopy in the past but cannot remember when or when the results were.  Evaluated by stroke neurology who recommend outpatient follow-up with [facility] at which time they will discuss whether or not to pursue colonoscopy plus or minus endoscopy. -hold eliquis for now -cbc in am -Monitor  #6.  Diabetes.  Controlled.  Hemoglobin A1c 5.7.  Home medications include lipid Zide. -Continue to hold glipizide -Sliding scale insulin for optimal control  #7.  Constipation.  Patient received 10 mg of Dulcolax yesterday with good results. -Monitor    Family Communication/Anticipated D/C date and plan/Code Status   DVT prophylaxis scd Code Status: Full Code.  Family  Communication: patient Disposition Plan: Home when ready hopefully tomorrow   Medical Consultants:    [facility] heart failure  [facility]   Anti-Infectives:    None  Subjective:   Patient ambulating in the hall with walker and cardiac rehab RN.  Smiling.  Denies pain or discomfort.  Objective:    Vitals:   02/15/20 0830 02/15/20 1222 02/15/20 1229 02/15/20 1239  BP: 113/70 99/66 100/61   Pulse: 68 63    Resp: 18 18    Temp: 97.7 F (36.5 C) (!) 97.5 F (36.4 C)    TempSrc: Oral Oral    SpO2: 97% 96%  95%  Weight:      Height:        Intake/Output Summary (Last 24 hours) at 02/15/2020 1342 Last data filed at 02/15/2020 1222 Gross per 24 hour  Intake 480 ml  Output 450 ml  Net 30 ml   Filed Weights   02/11/20 2215 02/12/20 0522 02/13/20 0500  Weight: 106.1 kg 105.1 kg 102.8 kg    Exam: General: Awake no acute distress CV: Regular rate and rhythm no murmur gallop or rub 1-2+ lower extremity edema up to his knees, Unna boots intact Respiratory: Only mild increased work of breathing with ambulation breath sounds clear I hear no crackles no wheezes Abdomen: Obese soft positive bowel sounds throughout no guarding or rebounding Musculoskeletal: Joints without swelling/erythema full range of motion Neuro: Awake alert oriented x3 speech clear facial symmetry  Data Reviewed:   I have personally reviewed following labs and imaging studies:  Labs: Labs show the following:   Basic Metabolic Panel: Recent Labs  Lab 02/11/20 1424 02/11/20 1424 02/12/20 0451 02/12/20 0451 02/13/20 0328 02/13/20 0328 02/14/20 0536 02/15/20 0356  NA 138  --  139  --  140  --  137 137  K 4.8   < > 4.0   < > 4.4   < > 4.5 4.5  CL 107  --  105  --  104  --  100 101  CO2 20*  --  23  --  24  --  25 23  GLUCOSE 93  --  116*  --  127*  --  123* 121*  BUN 56*  --  53*  --  52*  --  56* 62*  CREATININE 1.62*  --  1.53*  --  1.46*  --  1.73* 1.94*  CALCIUM 8.9  --  9.1  --  9.2  --  9.0 9.3   < > = values in this interval not displayed.   GFR Estimated Creatinine Clearance: 33 mL/min (A) (by C-G formula based on SCr  of 1.94 mg/dL (H)). Liver Function Tests: No results for input(s): AST, ALT, ALKPHOS, BILITOT, PROT, ALBUMIN in the last 168 hours. No results for input(s): LIPASE, AMYLASE in the last 168 hours. No results for input(s): AMMONIA in the last 168 hours. Coagulation profile Recent Labs  Lab 02/11/20 1424  INR 1.2    CBC: Recent Labs  Lab 02/11/20 1424 02/13/20 0328 02/14/20 0536 02/15/20 0356  WBC 7.4 6.0 5.4 7.0  HGB 8.8* 8.6* 8.2* 8.2*  HCT 28.6* 27.1* 25.6* 25.9*  MCV 92.6 90.9 90.5 90.6  PLT 267 273 236 239   Cardiac Enzymes: No results for input(s): CKTOTAL, CKMB, CKMBINDEX, TROPONINI in the last 168 hours. BNP (last 3 results) No results for input(s): PROBNP in the last 8760 hours. CBG: Recent Labs  Lab 02/14/20 1050 02/14/20 1601 02/14/20 2200 02/15/20 0601  02/15/20 1103  GLUCAP 118* 148* 140* 112* 133*   D-Dimer: No results for input(s): DDIMER in the last 72 hours. Hgb A1c: No results for input(s): HGBA1C in the last 72 hours. Lipid Profile: No results for input(s): CHOL, HDL, LDLCALC, TRIG, CHOLHDL, LDLDIRECT in the last 72 hours. Thyroid function studies: No results for input(s): TSH, T4TOTAL, T3FREE, THYROIDAB in the last 72 hours.  Invalid input(s): FREET3 Anemia work up: Recent Labs    02/13/20 0328  VITAMINB12 2,064*  FOLATE 8.8  FERRITIN 34  TIBC 426  IRON 31*  RETICCTPCT 2.5   Sepsis Labs: Recent Labs  Lab 02/11/20 1424 02/13/20 0328 02/14/20 0536 02/15/20 0356  WBC 7.4 6.0 5.4 7.0    Microbiology Recent Results (from the past 240 hour(s))  SARS CORONAVIRUS 2 (TAT 6-24 HRS) Nasopharyngeal Nasopharyngeal Swab     Status: None   Collection Time: 02/11/20  6:09 PM   Specimen: Nasopharyngeal Swab  Result Value Ref Range Status   SARS Coronavirus 2 NEGATIVE NEGATIVE Final    Comment: (NOTE) SARS-CoV-2 target nucleic acids are NOT DETECTED. The SARS-CoV-2 RNA is generally detectable in upper and lower respiratory specimens during  the acute phase of infection. Negative results do not preclude SARS-CoV-2 infection, do not rule out co-infections with other pathogens, and should not be used as the sole basis for treatment or other patient management decisions. Negative results must be combined with clinical observations, patient history, and epidemiological information. The expected result is Negative. Fact Sheet for Patients: HairSlick.no Fact Sheet for Healthcare Providers: quierodirigir.com This test is not yet approved or cleared by the [place] FDA and  has been authorized for detection and/or diagnosis of SARS-CoV-2 by FDA under an Emergency Use Authorization (EUA). This EUA will remain  in effect (meaning this test can be used) for the duration of the COVID-19 declaration under Section 56 4(b)(1) of the Act, 21 U.S.C. section 360bbb-3(b)(1), unless the authorization is terminated or revoked sooner. Performed at [facility], [address] [place], [place] [address]     Procedures and diagnostic studies:  No results found.  Medications:   . allopurinol  200 mg Oral Daily  . amiodarone  400 mg Oral BID  . carvedilol  3.125 mg Oral BID WC  . diclofenac Sodium  4 g Topical QID  . docusate sodium  100 mg Oral BID  . finasteride  5 mg Oral Daily  . hydrALAZINE  25 mg Oral TID  . insulin aspart  0-15 Units Subcutaneous TID WC  . insulin aspart  0-5 Units Subcutaneous QHS  . isosorbide mononitrate  30 mg Oral Daily  . levothyroxine  125 mcg Oral QAC breakfast  . magnesium oxide  400 mg Oral Daily  . rosuvastatin  10 mg Oral q1800  . sertraline  25 mg Oral Daily  . sodium chloride flush  3 mL Intravenous Q12H  . spironolactone  25 mg Oral Daily  . tamsulosin  0.4 mg Oral QHS  . torsemide  60 mg Oral Daily  . cyanocobalamin  1,000 mcg Oral Daily   Continuous Infusions: . sodium chloride       LOS: 3 days   Gwenyth Bender NP [facility]   How to contact the [facility] Attending or Consulting provider 7A - 7P or covering provider during after hours 7P -7A, for this patient?  1. Check the care team in [facility] and look for a) attending/consulting [facility] provider listed and b) the [facility] team listed 2.  Log into [email or web address] and use [place]'s universal password to access. If you do not have the password, please contact the hospital operator. 3. Locate the [facility] provider you are looking for under [facility] and page to a number that you can be directly reached. 4. If you still have difficulty reaching the provider, please page the [facility] (Director on Call) for the Hospitalists listed on amion for assistance.  02/15/2020, 1:42 PM

## 2020-02-15 NOTE — Progress Notes (Signed)
CARDIAC REHAB PHASE I   PRE:  Rate/Rhythm: 74 SR 1HB  BP:  Supine:   Sitting: 111/69  Standing:    SaO2: 93%RA  MODE:  Ambulation: 56 ft   POST:  Rate/Rhythm: 74 SR 1Hb  BP:  Supine:   Sitting: 107/67  Standing:    SaO2: 96%RA 5427-0623 Pt walked 56 ft on RA with rolling walker with slow steady gait. Tolerated well. Sats good on RA. No c/o SOB. Pt has CHF booklet and I left low sodium diets. 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   To sitting on side of bed after walk. Call bell in reach.   Luetta Nutting, RN BSN  02/15/2020 9:32 AM

## 2020-02-15 NOTE — Progress Notes (Signed)
Physical Therapy Treatment Patient Details Name: Ryan Macdonald MRN: [id number] DOB: 1933/12/02 Today's Date: 02/15/2020    History of Present Illness 86 y.o. male with medical history significant of NICM EF 20-25% on TEE last month, AICD placement, PAF on eliquis had DCCV last month, h/o PE, DM2, cardiac stenting, and cardioversion 12/2019. Pt admitted to ED on 3/15 with CHF exacerbation.    PT Comments    Pt in bed upon arrival of PT, agreeable to PT session with focus on progression of mobility and functional strength. The pt continues to present with limitations in functional mobility, endurance, and dynamic stability compared to their prior level of function and independence due to above dx. The pt reports that PTA he was not using any AD and was able to ambulate "down to the river bank" to fish, but he is currently limited to ~50 ft of ambulation using a RW to ambulate on flat, static surfaces. The pt will therefore continue to benefit from skilled PT to progress functional strength and postural reactions/dynamic stability necessary for him to return to his PLF.     Follow Up Recommendations  Home health PT;Supervision for mobility/OOB(vs OPPT, can pt's wife drive him to OP?)     Equipment Recommendations  None recommended by PT    Recommendations for Other Services       Precautions / Restrictions Precautions Precautions: Fall Precaution Comments: watch O2 Restrictions Weight Bearing Restrictions: No    Mobility  Bed Mobility               General bed mobility comments: pt sitting EOB upon PT arrival  Transfers Overall transfer level: Needs assistance Equipment used: Rolling walker (2 wheeled) Transfers: Sit to/from Stand Sit to Stand: Supervision         General transfer comment: pt able to stand with supervision and use of RW. No cues needed for hand positioning. 5Xsts in 37 sec  Ambulation/Gait Ambulation/Gait assistance: Min guard Gait Distance (Feet):  60 Feet Assistive device: Rolling walker (2 wheeled) Gait Pattern/deviations: Step-through pattern;Decreased stride length;Trunk flexed Gait velocity: decr. pt takes frequent rest breaks to talk during ambulation, unable to clock gait speed Gait velocity interpretation: <1.31 ft/sec, indicative of household ambulator General Gait Details: Min guard for safety, very increased time to perform with forward flexed posture.   Stairs             Wheelchair Mobility    Modified Rankin (Stroke Patients Only)       Balance Overall balance assessment: Needs assistance Sitting-balance support: No upper extremity supported;Feet supported Sitting balance-Leahy Scale: Good Sitting balance - Comments: mod I   Standing balance support: Bilateral upper extremity supported;During functional activity Standing balance-Leahy Scale: Poor Standing balance comment: reliant on external support in dynamic standing                            Cognition Arousal/Alertness: Awake/alert Behavior During Therapy: WFL for tasks assessed/performed Overall Cognitive Status: Within Functional Limits for tasks assessed                                 General Comments: Pt very agreeable with PT today, telling interesting stories about his past career working in the [employer]. Pt reports he doesn't love walking and exercise, but does report that he understands importance even if he is sometimes hesitant to be excited about therapy.  Exercises      General Comments        Pertinent Vitals/Pain Pain Assessment: Faces Faces Pain Scale: Hurts a little bit Pain Location: LEs Pain Descriptors / Indicators: Sore;Discomfort Pain Intervention(s): Limited activity within patient's tolerance;Monitored during session    Home Living                      Prior Function            PT Goals (current goals can now be found in the care plan section) Acute Rehab PT  Goals Patient Stated Goal: go home PT Goal Formulation: With patient Time For Goal Achievement: 02/28/20 Potential to Achieve Goals: Good Progress towards PT goals: Progressing toward goals    Frequency    Min 3X/week      PT Plan Current plan remains appropriate    Co-evaluation              AM-PAC PT "6 Clicks" Mobility   Outcome Measure  Help needed turning from your back to your side while in a flat bed without using bedrails?: A Little Help needed moving from lying on your back to sitting on the side of a flat bed without using bedrails?: A Little Help needed moving to and from a bed to a chair (including a wheelchair)?: A Little Help needed standing up from a chair using your arms (e.g., wheelchair or bedside chair)?: A Little Help needed to walk in hospital room?: A Little Help needed climbing 3-5 steps with a railing? : A Lot 6 Click Score: 17    End of Session Equipment Utilized During Treatment: Gait belt Activity Tolerance: Patient tolerated treatment well;Patient limited by fatigue Patient left: in bed;with call bell/phone within reach;with family/visitor present(sitting EOB) Nurse Communication: Mobility status PT Visit Diagnosis: Other abnormalities of gait and mobility (R26.89);Muscle weakness (generalized) (M62.81)     Time: 7616-0737 PT Time Calculation (min) (ACUTE ONLY): 27 min  Charges:  $Gait Training: 23-37 mins                     Ryan Macdonald, PT, DPT   Acute Rehabilitation Department Pager #: [phone]   Ryan Macdonald 02/15/2020, 12:46 PM

## 2020-02-16 LAB — CBC
HCT: 26.7 % — ABNORMAL LOW (ref 39.0–52.0)
Hemoglobin: 8.5 g/dL — ABNORMAL LOW (ref 13.0–17.0)
MCH: 29.3 pg (ref 26.0–34.0)
MCHC: 31.8 g/dL (ref 30.0–36.0)
MCV: 92.1 fL (ref 80.0–100.0)
Platelets: 247 10*3/uL (ref 150–400)
RBC: 2.9 MIL/uL — ABNORMAL LOW (ref 4.22–5.81)
RDW: 16.4 % — ABNORMAL HIGH (ref 11.5–15.5)
WBC: 6.4 10*3/uL (ref 4.0–10.5)
nRBC: 0.3 % — ABNORMAL HIGH (ref 0.0–0.2)

## 2020-02-16 LAB — BASIC METABOLIC PANEL
Anion gap: 13 (ref 5–15)
BUN: 71 mg/dL — ABNORMAL HIGH (ref 8–23)
CO2: 22 mmol/L (ref 22–32)
Calcium: 9 mg/dL (ref 8.9–10.3)
Chloride: 98 mmol/L (ref 98–111)
Creatinine, Ser: 2.52 mg/dL — ABNORMAL HIGH (ref 0.61–1.24)
GFR calc Af Amer: 26 mL/min — ABNORMAL LOW (ref >60)
GFR calc non Af Amer: 23 mL/min — ABNORMAL LOW (ref >60)
Glucose, Bld: 118 mg/dL — ABNORMAL HIGH (ref 70–99)
Potassium: 4.7 mmol/L (ref 3.5–5.1)
Sodium: 133 mmol/L — ABNORMAL LOW (ref 135–145)

## 2020-02-16 LAB — GLUCOSE, CAPILLARY
Glucose-Capillary: 105 mg/dL — ABNORMAL HIGH (ref 70–99)
Glucose-Capillary: 128 mg/dL — ABNORMAL HIGH (ref 70–99)
Glucose-Capillary: 134 mg/dL — ABNORMAL HIGH (ref 70–99)
Glucose-Capillary: 118 mg/dL — ABNORMAL HIGH (ref 70–99)

## 2020-02-16 MED ORDER — HYDRALAZINE HCL 25 MG PO TABS
12.5000 mg | ORAL_TABLET | Freq: Three times a day (TID) | ORAL | Status: DC
  Administered 2020-02-16 (×2): 12.5 mg via ORAL
  Filled 2020-02-16 (×3): qty 1

## 2020-02-16 MED ORDER — SODIUM CHLORIDE 0.9 % IV SOLN
1.0000 g | INTRAVENOUS | Status: DC
  Administered 2020-02-16 – 2020-02-17 (×2): 1 g via INTRAVENOUS
  Filled 2020-02-16 (×2): qty 1
  Filled 2020-02-16: qty 10

## 2020-02-16 NOTE — Progress Notes (Signed)
Patient ID: Ryan Macdonald, male   DOB: 29-Jan-1934, 86 y.o.   MRN: [id number]     Advanced Heart Failure Rounding Note  PCP-Cardiologist: Skeet Latch, MD   Subjective:    Creatinine up again today to 2.5.  BP remains on the low side, SBP 90s-100s.     He remains in NSR on amiodarone.   E coli UTI noted.  He is afebrile with normal WBCs.   Hgb higher at 8.5.  GI saw and plan outpatient workup.    Objective:   Weight Range: 103.6 kg Body mass index is 35.77 kg/m.   Vital Signs:   Temp:  [97.5 F (36.4 C)-98.3 F (36.8 C)] 97.5 F (36.4 C) (03/20 0612) Pulse Rate:  [59-65] 61 (03/20 0831) Resp:  [16-18] 16 (03/20 0612) BP: (90-105)/(61-67) 105/67 (03/20 0831) SpO2:  [95 %-99 %] 97 % (03/20 0612) Weight:  [103.6 kg] 103.6 kg (03/20 0612) Last BM Date: 02/14/20  Weight change: Filed Weights   02/12/20 0522 02/13/20 0500 02/16/20 0612  Weight: 105.1 kg 102.8 kg 103.6 kg    Intake/Output:   Intake/Output Summary (Last 24 hours) at 02/16/2020 1030 Last data filed at 02/16/2020 0831 Gross per 24 hour  Intake 480 ml  Output 200 ml  Net 280 ml      Physical Exam    General: NAD Neck: JVP 9-10 cm, no thyromegaly or thyroid nodule.  Lungs: Clear to auscultation bilaterally with normal respiratory effort. CV: Nondisplaced PMI.  Heart regular S1/S2, no S3/S4, no murmur.  1+ edema ankles, lower legs wrapped.   Abdomen: Soft, nontender, no hepatosplenomegaly, no distention.  Skin: Intact without lesions or rashes.  Neurologic: Alert and oriented x 3.  Psych: Normal affect. Extremities: No clubbing or cyanosis.  HEENT: Normal.    Telemetry   NSR 60s (personally reviewed)  Labs    CBC Recent Labs    02/15/20 0356 02/16/20 0236  WBC 7.0 6.4  HGB 8.2* 8.5*  HCT 25.9* 26.7*  MCV 90.6 92.1  PLT 239 950   Basic Metabolic Panel Recent Labs    02/15/20 0356 02/16/20 0236  NA 137 133*  K 4.5 4.7  CL 101 98  CO2 23 22  GLUCOSE 121* 118*  BUN 62* 71*    CREATININE 1.94* 2.52*  CALCIUM 9.3 9.0   Liver Function Tests No results for input(s): AST, ALT, ALKPHOS, BILITOT, PROT, ALBUMIN in the last 72 hours. No results for input(s): LIPASE, AMYLASE in the last 72 hours. Cardiac Enzymes No results for input(s): CKTOTAL, CKMB, CKMBINDEX, TROPONINI in the last 72 hours.  BNP: BNP (last 3 results) Recent Labs    02/11/20 1739  BNP 1,569.0*    ProBNP (last 3 results) No results for input(s): PROBNP in the last 8760 hours.   D-Dimer No results for input(s): DDIMER in the last 72 hours. Hemoglobin A1C No results for input(s): HGBA1C in the last 72 hours. Fasting Lipid Panel No results for input(s): CHOL, HDL, LDLCALC, TRIG, CHOLHDL, LDLDIRECT in the last 72 hours. Thyroid Function Tests No results for input(s): TSH, T4TOTAL, T3FREE, THYROIDAB in the last 72 hours.  Invalid input(s): FREET3  Other results:   Imaging    No results found.   Medications:     Scheduled Medications: . allopurinol  200 mg Oral Daily  . amiodarone  400 mg Oral BID  . carvedilol  3.125 mg Oral BID WC  . diclofenac Sodium  4 g Topical QID  . docusate sodium  100 mg Oral  BID  . finasteride  5 mg Oral Daily  . hydrALAZINE  12.5 mg Oral TID  . insulin aspart  0-15 Units Subcutaneous TID WC  . insulin aspart  0-5 Units Subcutaneous QHS  . isosorbide mononitrate  30 mg Oral Daily  . levothyroxine  125 mcg Oral QAC breakfast  . magnesium oxide  400 mg Oral Daily  . rosuvastatin  10 mg Oral q1800  . sertraline  25 mg Oral Daily  . sodium chloride flush  3 mL Intravenous Q12H  . tamsulosin  0.4 mg Oral QHS  . cyanocobalamin  1,000 mcg Oral Daily    Infusions: . sodium chloride    . cefTRIAXone (ROCEPHIN)  IV      PRN Medications: sodium chloride, acetaminophen, bisacodyl, guaiFENesin-codeine, HYDROcodone-acetaminophen, ipratropium-albuterol, methocarbamol, ondansetron (ZOFRAN) IV, sodium chloride flush   Assessment/Plan   1. Acute on  chronic systolic CHF: Primarily nonischemic cardiomyopathy. EF has been low since at least 2014.  He has single chamber Biotronik ICD. QRS not wide enough for CRT.Settings adjusted in the past to prevent RV pacing in the face of a long 1st degree AV block.  TEE in 2/21 with EF 20-25%, diffuse hypokinesis and mild LVH, mild RV systolic dysfunction. He was admitted after taking the wrong meds (no diuretic) for a number of days and developing volume overload.  He has converted to NSR on amiodarone.  Hydralazine cut back with soft BP, SBP still low in the 90s-100s range.  IV Lasix stopped 3/18, creatinine up again 1.94 => 2.5.  On exam, I suspect he still has at least mild volume overload.   - With rise in creatinine, hold torsemide for now and stop spironolactone.  - With soft BP, will cut back on hydralazine again to 12.5 mg tid and keep Imdur 30 mg daily. Bidil not [facility] by [facility].   -Hold Coreg for now with soft BP and rise in creatinine.  -No ACEI or ARNI with angioedema on lisinopril, would hold off on ARB with CKD stage 3 and h/o AKI.  2. CAD: S/p PCI 07/2018 to pLAD but CAD does not explain his cardiomyopathy.  No chest pain.  - He is off Plavix now, on Eliquis for his atrial fibrillation.  - Continue Crestor 3.Atrial fibrillation: He had been in atrial fibrillation initially this admission but now back in NSR on amiodarone.   - Continue Eliquis 2.5 mg bid (age, creatinine > 1.5).  - Continue amiodarone.  He has history of hypothyroidism with amiodarone in the past but is on Levoxyl.  4. AKI on CKD stage 3: As above, hold diuretics and will try to get BP higher.   5. Hypothyroidism: Suspect this is due to amiodarone.  He has been on Levoxyl.  6. Anemia: Fe deficiency with FOBT+.  He denies overt bleeding.  He has had feraheme.  Hgb mildly higher at 8.5.  GI saw him and plan outpatient workup.  7. ID: E coli UTI.  - Ceftriaxone started.   Length of Stay: 4  [name], MD  02/16/2020, 10:30  AM  Advanced Heart Failure Team Pager [phone] (M-F; 7a - 4p)  Please contact [facility] for night-coverage after hours (4p -7a ) and weekends on [email or web address]

## 2020-02-16 NOTE — Progress Notes (Addendum)
PROGRESS NOTE  Ryan Macdonald ERX:[id number] DOB: 02/02/1934 DOA: 02/11/2020 [facility]CP: [facility]  Brief History   86 year old man presented with bilateral lower extremity edema and was found to have acute on chronic heart failure and atrial fibrillation with rapid ventricular response.  Followed by advanced heart failure team, treated with diuresis and rhythm managed with amiodarone.  Continues to require close monitoring as he remains volume overload.  A & P  Acute on chronic systolic CHF primarily nonischemic cardiomyopathy --Appears asymptomatic.  Priscilla significant volume overload.  No ACE inhibitor secondary to angioedema on lisinopril. --Given rising creatinine, torsemide and spironolactone held by cardiology, hydralazine cut back secondary to soft blood pressure, Imdur continued.  Carvedilol held today by cardiology for soft blood pressure.  Atrial fibrillation, had an episode of atrial fibrillation with RVR, this recurred and he was started on amiodarone and converted to sinus rhythm. --Eliquis was held secondary to hematuria.  We will restart this tomorrow. --Remains in sinus rhythm, sinus bradycardia currently.  Continue amiodarone and carvedilol per cardiology  Acute UTI with hematuria --E. coli, sensitivities pending.  Change to oral antibiotics tomorrow.  AKI superimposed on CKD stage IIIb --Secondary to diuresis.  Diuretic on hold per cardiology.  BMP in a.m.  Diabetes mellitus type 2, on oral agents only.  Hemoglobin A1c 5.7. --CBG stable.  Continue sliding scale insulin.  Anemia, fecal occult blood positive --Hemoglobin stable at 8.5.  Was seen by gastroenterology with recommendation for outpatient follow-up.  Anemia panel was equivocal and he was treated with [facility] March 17.  He will follow-up with [facility] as an outpatient.  Essential hypertension, blood pressures have been soft and medications have been adjusted by cardiology. --Medications currently  on hold  Disposition Plan:  From: home Anticipated disposition: home Discussion: Continues to be followed by advanced heart failure team, now with acute kidney injury related to diuresis, still has some volume overload as well.  Difficult case.  Cardiology plans to withhold medications today and reevaluate tomorrow.  No opportunity for discharge yet.  DVT prophylaxis: resume apixaban 3/21 Code Status: Full Family Communication: none present, none requested; patient appears to understand plan.    Brendia Sacks, MD  [facility] Direct contact: see [email or web address] (further directions at bottom of note if needed) 7PM-7AM contact night coverage as at bottom of note 02/16/2020, 2:29 PM  LOS: 4 days   Significant Hospital Events   .    Consults:  .    Procedures:  .   Significant Diagnostic Tests:  Marland Kitchen    Micro Data:  .    Antimicrobials:  .   Interval History/Subjective  Feels okay.  Breathing fine.  No complaints.  Objective   Vitals:  Vitals:   02/16/20 0831 02/16/20 1212  BP: 105/67 95/61  Pulse: 61 (!) 58  Resp:  18  Temp:  97.8 F (36.6 C)  SpO2:  97%    Exam:  Constitutional.  Appears calm and comfortable. Respiratory.  Clear to auscultation bilaterally.  No wheezes, rales or rhonchi.  Normal respiratory effort. Cardiovascular.  Regular rate and rhythm.  No murmur, rub or gallop.  2+ bilateral lower extremity edema.  Feet wrapped.  Telemetry shows sinus bradycardia. Psychiatric.  Grossly normal mood and affect.  Speech fluent and appropriate.  I have personally reviewed the following:   Today's Data  . CBG stable . Sodium slightly lower at 133.  Creatinine slightly up at 2.52. Marland Kitchen Hemoglobin stable at 8.5. . Urinalysis was grossly positive, culture  growing E. coli  Scheduled Meds: . allopurinol  200 mg Oral Daily  . amiodarone  400 mg Oral BID  . diclofenac Sodium  4 g Topical QID  . docusate sodium  100 mg Oral BID  . finasteride  5 mg Oral  Daily  . hydrALAZINE  12.5 mg Oral TID  . insulin aspart  0-15 Units Subcutaneous TID WC  . insulin aspart  0-5 Units Subcutaneous QHS  . isosorbide mononitrate  30 mg Oral Daily  . levothyroxine  125 mcg Oral QAC breakfast  . magnesium oxide  400 mg Oral Daily  . rosuvastatin  10 mg Oral q1800  . sertraline  25 mg Oral Daily  . sodium chloride flush  3 mL Intravenous Q12H  . tamsulosin  0.4 mg Oral QHS  . cyanocobalamin  1,000 mcg Oral Daily   Continuous Infusions: . sodium chloride    . cefTRIAXone (ROCEPHIN)  IV 1 g (02/16/20 1135)    Principal Problem:   Acute on chronic heart failure (HCC) Active Problems:   Obesity (BMI 30-39.9)   Essential hypertension   Atrial fibrillation (HCC)   Chronic renal insufficiency, stage 3 (moderate) (HCC)   Diabetes mellitus type 2 in obese (HCC)   Morbid obesity (HCC)   Constipation   Anemia   Hematuria   CKD (chronic kidney disease), stage III   LOS: 4 days   How to contact the [facility] Attending or Consulting provider [place] or covering provider during after hours [place], for this patient?  1. Check the care team in [facility] and look for a) attending/consulting TRH provider listed and b) the [facility] team listed 2. Log into [email or web address] and use [place]'s universal password to access. If you do not have the password, please contact the hospital operator. 3. Locate the [facility] provider you are looking for under [facility] and page to a number that you can be directly reached. 4. If you still have difficulty reaching the provider, please page the [facility] (Director on Call) for the Hospitalists listed on amion for assistance.

## 2020-02-16 NOTE — Progress Notes (Signed)
1100-1110 Came to see pt to walk. Pt in bed resting. Stated does not want to walk at this time. He stated will walk later in room. Has IS and encouraged pt to use. Pt has oxygen on now and stated he has it at home for use.  Staff can walk later when pt ready. Luetta Nutting RN BSN 02/16/2020 11:11 AM

## 2020-02-16 NOTE — Progress Notes (Signed)
Orthopedic Tech Progress Note Patient Details:  Ryan Macdonald 05-24-1934 [id number]  Ortho Devices Type of Ortho Device: Ace wrap, Unna boot Ortho Device/Splint Location: B LE Ortho Device/Splint Interventions: Application   Post Interventions Patient Tolerated: Well Instructions Provided: Care of device   [name] 02/16/2020, 10:16 AM

## 2020-02-17 DIAGNOSIS — N390 Urinary tract infection, site not specified: Secondary | ICD-10-CM

## 2020-02-17 LAB — BASIC METABOLIC PANEL
Anion gap: 13 (ref 5–15)
BUN: 78 mg/dL — ABNORMAL HIGH (ref 8–23)
CO2: 23 mmol/L (ref 22–32)
Calcium: 9.1 mg/dL (ref 8.9–10.3)
Chloride: 98 mmol/L (ref 98–111)
Creatinine, Ser: 2.86 mg/dL — ABNORMAL HIGH (ref 0.61–1.24)
GFR calc Af Amer: 23 mL/min — ABNORMAL LOW (ref >60)
GFR calc non Af Amer: 19 mL/min — ABNORMAL LOW (ref >60)
Glucose, Bld: 110 mg/dL — ABNORMAL HIGH (ref 70–99)
Potassium: 4.9 mmol/L (ref 3.5–5.1)
Sodium: 134 mmol/L — ABNORMAL LOW (ref 135–145)

## 2020-02-17 LAB — URINE CULTURE: Culture: >=100000 — AB

## 2020-02-17 LAB — GLUCOSE, CAPILLARY
Glucose-Capillary: 107 mg/dL — ABNORMAL HIGH (ref 70–99)
Glucose-Capillary: 105 mg/dL — ABNORMAL HIGH (ref 70–99)
Glucose-Capillary: 174 mg/dL — ABNORMAL HIGH (ref 70–99)

## 2020-02-17 MED ORDER — APIXABAN 2.5 MG PO TABS
2.5000 mg | ORAL_TABLET | Freq: Two times a day (BID) | ORAL | Status: DC
  Administered 2020-02-17 – 2020-02-24 (×12): 2.5 mg via ORAL
  Filled 2020-02-17 (×12): qty 1

## 2020-02-17 NOTE — Progress Notes (Signed)
Informed cardiology [place] of pt blood pressure  Pt asymptomatic  Cardiology stated to give amiodarone now and Imdur at 1000 Educated pt on plan of care  Pt aware, all belongings in reach including call light  Will continue to monitor

## 2020-02-17 NOTE — Progress Notes (Signed)
PROGRESS NOTE  Ryan Macdonald LTJ:[id number] DOB: 1934/10/27 DOA: 02/11/2020 [facility]: [facility]  Brief History   86 year old man presented with bilateral lower extremity edema and was found to have acute on chronic heart failure and atrial fibrillation with rapid ventricular response.  Followed by advanced heart failure team, treated with diuresis and rhythm managed with amiodarone.  Continues to require close monitoring as he remains volume overload.  A & P  Acute on chronic systolic CHF primarily nonischemic cardiomyopathy --Still has volume overload.  Discussed in detail with Dr. Aundra Dubin.  Patient now off carvedilol, diuretics and hydralazine secondary to low blood pressure.  Worsening creatinine is concerning.  May need right heart cath for low output/cardiorenal syndrome.  Acute kidney injury superimposed on CKD stage IIIb --Renal function worsening off diuretics.  Concern for cardiorenal syndrome/low output. --Continue to hold diuretics, discontinue antihypertensives as per cardiology.  BMP in the morning.  Continue strict I's/O.  Atrial fibrillation, had an episode of atrial fibrillation with RVR, this recurred and he was started on amiodarone and converted to sinus rhythm. --Can resume apixaban tonight.  Continue amiodarone per cardiology.  Acute E. coli UTI with hematuria --E. coli pansensitive.  Complete 3 days antibiotics.  Diabetes mellitus type 2, on oral agents only.  Hemoglobin A1c 5.7. --CBG remains stable.  Continue sliding scale insulin.  Anemia, fecal occult blood positive --Hemoglobin stable at 8.5.  Was seen by gastroenterology with recommendation for outpatient follow-up.  Anemia panel was equivocal and he was treated with [facility] March 17.  He will follow-up with [facility] as an outpatient.  Essential hypertension --Blood pressure has been soft.  Antihypertensive stopped per cardiology.  Disposition Plan:  From: home Anticipated disposition:  home Discussion: Not improving.  Continues to have volume overload and worsening renal function off diuretics.  Discussed with cardiology, may need to pursue right heart catheterization tomorrow if condition not improved.  DVT prophylaxis: resume apixaban this evening Code Status: Full Family Communication: Wife at bedside    Murray Hodgkins, MD  [facility] Direct contact: see [email or web address] (further directions at bottom of note if needed) 7PM-7AM contact night coverage as at bottom of note 02/17/2020, 3:22 PM  LOS: 5 days   Interval History/Subjective  No complaints today.  Urine is still dark.  Wife at bedside.  Objective   Vitals:  Vitals:   02/17/20 1004 02/17/20 1223  BP: 109/66 (!) 96/58  Pulse: 66 63  Resp:  18  Temp:  98 F (36.7 C)  SpO2:  99%    Exam:  Constitutional.  Appears calm, comfortable. Respiratory.  Clear to auscultation bilaterally.  No wheezes, rales or rhonchi.  Normal respiratory effort. Cardiovascular.  Regular rate and rhythm.  No murmur, rub or gallop.  Telemetry sinus rhythm.  2+ bilateral pedal edema.  Unna boots present bilaterally. Psychiatric.  Grossly normal mood and affect.  Speech fluent and appropriate.  I have personally reviewed the following:   Today's Data  . CBG stable . BUN trending up to 78, creatinine trending up to 2.86.  Scheduled Meds: . allopurinol  200 mg Oral Daily  . amiodarone  400 mg Oral BID  . apixaban  2.5 mg Oral BID  . diclofenac Sodium  4 g Topical QID  . docusate sodium  100 mg Oral BID  . finasteride  5 mg Oral Daily  . insulin aspart  0-15 Units Subcutaneous TID WC  . insulin aspart  0-5 Units Subcutaneous QHS  . isosorbide mononitrate  30  mg Oral Daily  . levothyroxine  125 mcg Oral QAC breakfast  . magnesium oxide  400 mg Oral Daily  . rosuvastatin  10 mg Oral q1800  . sertraline  25 mg Oral Daily  . sodium chloride flush  3 mL Intravenous Q12H  . tamsulosin  0.4 mg Oral QHS  . cyanocobalamin   1,000 mcg Oral Daily   Continuous Infusions: . sodium chloride    . cefTRIAXone (ROCEPHIN)  IV 1 g (02/17/20 1202)    Principal Problem:   Acute on chronic heart failure (HCC) Active Problems:   Obesity (BMI 30-39.9)   Essential hypertension   Atrial fibrillation (HCC)   Chronic renal insufficiency, stage 3 (moderate) (HCC)   Diabetes mellitus type 2 in obese (HCC)   Morbid obesity (HCC)   Constipation   Anemia   Hematuria   CKD (chronic kidney disease), stage III   LOS: 5 days   How to contact the [facility] Attending or Consulting provider 7A - 7P or covering provider during after hours 7P -7A, for this patient?  1. Check the care team in [facility] and look for a) attending/consulting TRH provider listed and b) the [facility] team listed 2. Log into [email or web address] and use [place]'s universal password to access. If you do not have the password, please contact the hospital operator. 3. Locate the [facility] provider you are looking for under [facility] and page to a number that you can be directly reached. 4. If you still have difficulty reaching the provider, please page the [facility] (Director on Call) for the Hospitalists listed on amion for assistance.

## 2020-02-17 NOTE — Progress Notes (Signed)
Patient ID: Ryan Macdonald, male   DOB: Apr 05, 1934, 86 y.o.   MRN: [id number]     Advanced Heart Failure Rounding Note  PCP-Cardiologist: Skeet Latch, MD   Subjective:    Creatinine up again today to 2.86.  SBP 90s-100s.      He remains in NSR on amiodarone.   E coli UTI noted.  He is afebrile with normal WBCs. Ceftriaxone started.  He says that urine is clearing some (hematuria) though no sample for me to see today.   No hgb today.  GI saw and plan outpatient workup.    Objective:   Weight Range: 104.5 kg Body mass index is 36.08 kg/m.   Vital Signs:   Temp:  [97.6 F (36.4 C)-98 F (36.7 C)] 98 F (36.7 C) (03/21 0533) Pulse Rate:  [58-66] 66 (03/21 1004) Resp:  [17-18] 18 (03/21 0533) BP: (90-109)/(55-70) 109/66 (03/21 1004) SpO2:  [97 %-99 %] 99 % (03/21 0533) Weight:  [104.5 kg] 104.5 kg (03/21 0533) Last BM Date: 02/14/20  Weight change: Filed Weights   02/13/20 0500 02/16/20 0612 02/17/20 0533  Weight: 102.8 kg 103.6 kg 104.5 kg    Intake/Output:   Intake/Output Summary (Last 24 hours) at 02/17/2020 1035 Last data filed at 02/17/2020 0834 Gross per 24 hour  Intake 940 ml  Output 350 ml  Net 590 ml      Physical Exam    General: NAD Neck: JVP 9-10 cm, no thyromegaly or thyroid nodule.  Lungs: Clear to auscultation bilaterally with normal respiratory effort. CV: Nondisplaced PMI.  Heart regular S1/S2, no S3/S4, no murmur.  1+ edema ankles, lower legs wrapped.   Abdomen: Soft, nontender, no hepatosplenomegaly, no distention.  Skin: Intact without lesions or rashes.  Neurologic: Alert and oriented x 3.  Psych: Normal affect. Extremities: No clubbing or cyanosis.  HEENT: Normal.    Telemetry   NSR 60s (personally reviewed)  Labs    CBC Recent Labs    02/15/20 0356 02/16/20 0236  WBC 7.0 6.4  HGB 8.2* 8.5*  HCT 25.9* 26.7*  MCV 90.6 92.1  PLT 239 229   Basic Metabolic Panel Recent Labs    02/16/20 0236 02/17/20 0503  NA 133*  134*  K 4.7 4.9  CL 98 98  CO2 22 23  GLUCOSE 118* 110*  BUN 71* 78*  CREATININE 2.52* 2.86*  CALCIUM 9.0 9.1   Liver Function Tests No results for input(s): AST, ALT, ALKPHOS, BILITOT, PROT, ALBUMIN in the last 72 hours. No results for input(s): LIPASE, AMYLASE in the last 72 hours. Cardiac Enzymes No results for input(s): CKTOTAL, CKMB, CKMBINDEX, TROPONINI in the last 72 hours.  BNP: BNP (last 3 results) Recent Labs    02/11/20 1739  BNP 1,569.0*    ProBNP (last 3 results) No results for input(s): PROBNP in the last 8760 hours.   D-Dimer No results for input(s): DDIMER in the last 72 hours. Hemoglobin A1C No results for input(s): HGBA1C in the last 72 hours. Fasting Lipid Panel No results for input(s): CHOL, HDL, LDLCALC, TRIG, CHOLHDL, LDLDIRECT in the last 72 hours. Thyroid Function Tests No results for input(s): TSH, T4TOTAL, T3FREE, THYROIDAB in the last 72 hours.  Invalid input(s): FREET3  Other results:   Imaging    No results found.   Medications:     Scheduled Medications: . allopurinol  200 mg Oral Daily  . amiodarone  400 mg Oral BID  . diclofenac Sodium  4 g Topical QID  . docusate sodium  100 mg Oral BID  . finasteride  5 mg Oral Daily  . insulin aspart  0-15 Units Subcutaneous TID WC  . insulin aspart  0-5 Units Subcutaneous QHS  . isosorbide mononitrate  30 mg Oral Daily  . levothyroxine  125 mcg Oral QAC breakfast  . magnesium oxide  400 mg Oral Daily  . rosuvastatin  10 mg Oral q1800  . sertraline  25 mg Oral Daily  . sodium chloride flush  3 mL Intravenous Q12H  . tamsulosin  0.4 mg Oral QHS  . cyanocobalamin  1,000 mcg Oral Daily    Infusions: . sodium chloride    . cefTRIAXone (ROCEPHIN)  IV 1 g (02/16/20 1135)    PRN Medications: sodium chloride, acetaminophen, bisacodyl, guaiFENesin-codeine, HYDROcodone-acetaminophen, ipratropium-albuterol, methocarbamol, ondansetron (ZOFRAN) IV, sodium chloride  flush   Assessment/Plan   1. Acute on chronic systolic CHF: Primarily nonischemic cardiomyopathy. EF has been low since at least 2014.  He has single chamber Biotronik ICD. QRS not wide enough for CRT.Settings adjusted in the past to prevent RV pacing in the face of a long 1st degree AV block.  TEE in 2/21 with EF 20-25%, diffuse hypokinesis and mild LVH, mild RV systolic dysfunction. He was admitted after taking the wrong meds (no diuretic) for a number of days and developing volume overload.  He has converted to NSR on amiodarone.  Hydralazine cut back and Coreg stopped with soft BP, SBP still low in the 90s-100s range.  He is also off diuretics, creatinine up again 1.94 => 2.5 => 2.8.  On exam, I suspect he still has at least mild volume overload.  ?If low BP/AKI is related to infection/UTI or ?low output though not particularly symptomatic and not cool on exam.  - With rise in creatinine, hold torsemide for now and also off spironolactone.  - With soft BP, will stop hydralazine.   -Hold Coreg for now with soft BP and rise in creatinine.  -No ACEI or ARNI with angioedema on lisinopril, would hold off on ARB with CKD stage 3 and h/o AKI.  - May need RHC for ?low output/cardiorenal syndrome.  If no improvement tomorrow, will arrange.  2. CAD: S/p PCI 07/2018 to pLAD but CAD does not explain his cardiomyopathy.  No chest pain.  - He is off Plavix now, on Eliquis for his atrial fibrillation.  - Continue Crestor 3.Atrial fibrillation: He had been in atrial fibrillation initially this admission but now back in NSR on amiodarone.   - Eliquis held for hematuria, this is looking better so will restart tonight.  - Continue amiodarone.  He has history of hypothyroidism with amiodarone in the past but is on Levoxyl.  4. AKI on CKD stage 3: As above, hold diuretics and will try to get BP higher.   5. Hypothyroidism: Suspect this is due to amiodarone.  He has been on Levoxyl.  6. Anemia: Fe deficiency  with FOBT+.  He denies overt bleeding.  He has had feraheme.  GI saw him and plan outpatient workup.  - Follow CBC.  7. ID: E coli UTI.  - Ceftriaxone started.   Length of Stay: 5  [name], MD  02/17/2020, 10:35 AM  Advanced Heart Failure Team Pager [phone] (M-F; 7a - 4p)  Please contact [facility] for night-coverage after hours (4p -7a ) and weekends on [email or web address]

## 2020-02-18 ENCOUNTER — Other Ambulatory Visit: Payer: No Typology Code available for payment source | Admitting: *Deleted

## 2020-02-18 DIAGNOSIS — K5900 Constipation, unspecified: Secondary | ICD-10-CM

## 2020-02-18 LAB — GLUCOSE, CAPILLARY
Glucose-Capillary: 151 mg/dL — ABNORMAL HIGH (ref 70–99)
Glucose-Capillary: 113 mg/dL — ABNORMAL HIGH (ref 70–99)
Glucose-Capillary: 127 mg/dL — ABNORMAL HIGH (ref 70–99)
Glucose-Capillary: 127 mg/dL — ABNORMAL HIGH (ref 70–99)
Glucose-Capillary: 124 mg/dL — ABNORMAL HIGH (ref 70–99)

## 2020-02-18 LAB — CBC WITH DIFFERENTIAL/PLATELET
Abs Immature Granulocytes: 0.02 10*3/uL (ref 0.00–0.07)
Basophils Absolute: 0 10*3/uL (ref 0.0–0.1)
Basophils Relative: 1 %
Eosinophils Absolute: 0.1 10*3/uL (ref 0.0–0.5)
Eosinophils Relative: 2 %
HCT: 27.1 % — ABNORMAL LOW (ref 39.0–52.0)
Hemoglobin: 8.4 g/dL — ABNORMAL LOW (ref 13.0–17.0)
Immature Granulocytes: 0 %
Lymphocytes Relative: 17 %
Lymphs Abs: 1.1 10*3/uL (ref 0.7–4.0)
MCH: 28.9 pg (ref 26.0–34.0)
MCHC: 31 g/dL (ref 30.0–36.0)
MCV: 93.1 fL (ref 80.0–100.0)
Monocytes Absolute: 1 10*3/uL (ref 0.1–1.0)
Monocytes Relative: 15 %
Neutro Abs: 4.2 10*3/uL (ref 1.7–7.7)
Neutrophils Relative %: 65 %
Platelets: 231 10*3/uL (ref 150–400)
RBC: 2.91 MIL/uL — ABNORMAL LOW (ref 4.22–5.81)
RDW: 16.9 % — ABNORMAL HIGH (ref 11.5–15.5)
WBC: 6.4 10*3/uL (ref 4.0–10.5)
nRBC: 0.5 % — ABNORMAL HIGH (ref 0.0–0.2)

## 2020-02-18 LAB — BASIC METABOLIC PANEL
Anion gap: 14 (ref 5–15)
BUN: 86 mg/dL — ABNORMAL HIGH (ref 8–23)
CO2: 23 mmol/L (ref 22–32)
Calcium: 8.8 mg/dL — ABNORMAL LOW (ref 8.9–10.3)
Chloride: 98 mmol/L (ref 98–111)
Creatinine, Ser: 2.74 mg/dL — ABNORMAL HIGH (ref 0.61–1.24)
GFR calc Af Amer: 24 mL/min — ABNORMAL LOW (ref >60)
GFR calc non Af Amer: 20 mL/min — ABNORMAL LOW (ref >60)
Glucose, Bld: 114 mg/dL — ABNORMAL HIGH (ref 70–99)
Potassium: 4.6 mmol/L (ref 3.5–5.1)
Sodium: 135 mmol/L (ref 135–145)

## 2020-02-18 MED ORDER — SODIUM CHLORIDE 0.9% FLUSH
3.0000 mL | Freq: Two times a day (BID) | INTRAVENOUS | Status: DC
  Administered 2020-02-18 – 2020-02-24 (×9): 3 mL via INTRAVENOUS

## 2020-02-18 MED ORDER — AMOXICILLIN-POT CLAVULANATE 500-125 MG PO TABS
1.0000 | ORAL_TABLET | Freq: Two times a day (BID) | ORAL | Status: DC
  Administered 2020-02-18 – 2020-02-23 (×11): 500 mg via ORAL
  Filled 2020-02-18 (×13): qty 1

## 2020-02-18 MED ORDER — BISACODYL 10 MG RE SUPP
10.0000 mg | Freq: Every day | RECTAL | Status: DC | PRN
  Administered 2020-02-18: 10 mg via RECTAL
  Filled 2020-02-18: qty 1

## 2020-02-18 NOTE — Progress Notes (Signed)
[id number] Came to see pt to walk. Had already walked with PT earlier. Resting now. Wife in room. Pt stated he gets up in room to bathroom. Will continue to follow. Luetta Nutting RN BSN 02/18/2020 2:59 PM

## 2020-02-18 NOTE — Progress Notes (Signed)
Physical Therapy Treatment Patient Details Name: Ryan Macdonald MRN: [id number] DOB: 08-Jan-1934 Today's Date: 02/18/2020    History of Present Illness 86 y.o. male with medical history significant of NICM EF 20-25% on TEE last month, AICD placement, PAF on eliquis had DCCV last month, h/o PE, DM2, cardiac stenting, and cardioversion 12/2019. Pt admitted to ED on 3/15 with CHF exacerbation.    PT Comments    Pt fatigued upon PT arrival, states he has been trying to have BM x15 minutes with limited success. Pt requires VERY increased time for gait this session, with cuing needed for safe use of RW and gait parameters including step length and foot clearance. Pt stood at sink and toileted with close guard form PT but pt able to perform with increased time. PT to continue to follow acutely, and attempt to progress pt mobility.    Follow Up Recommendations  Home health PT;Supervision for mobility/OOB     Equipment Recommendations  None recommended by PT    Recommendations for Other Services       Precautions / Restrictions Precautions Precautions: Fall Restrictions Weight Bearing Restrictions: No    Mobility  Bed Mobility Overal bed mobility: Needs Assistance             General bed mobility comments: up on toilet upon arrival to room, sitting EOB eating upon PT exit  Transfers Overall transfer level: Needs assistance Equipment used: Rolling walker (2 wheeled) Transfers: Sit to/from Stand Sit to Stand: Min guard         General transfer comment: Min guard for safety, with increased time to rise and steady. Verbal cuing for use of grab bar in BR. Min guard for stand to sit for slow lowering to EOB.  Ambulation/Gait Ambulation/Gait assistance: Min guard Gait Distance (Feet): 15 Feet Assistive device: Rolling walker (2 wheeled) Gait Pattern/deviations: Step-through pattern;Decreased stride length;Trunk flexed;Shuffle Gait velocity: decr   General Gait Details: Min  guard for safey, very increased time with verbal cuing for increasing step length, bilateral foot clearance, and placement in RW. Standing time at sink x5 minutes post-toileting   Stairs             Wheelchair Mobility    Modified Rankin (Stroke Patients Only)       Balance Overall balance assessment: Needs assistance Sitting-balance support: No upper extremity supported;Feet supported Sitting balance-Leahy Scale: Good Sitting balance - Comments: mod I   Standing balance support: Bilateral upper extremity supported;During functional activity Standing balance-Leahy Scale: Poor Standing balance comment: reliant on external support in dynamic standing                            Cognition Arousal/Alertness: Awake/alert Behavior During Therapy: WFL for tasks assessed/performed Overall Cognitive Status: Within Functional Limits for tasks assessed                                 General Comments: resistant to participate in PT today, but agreeable wit hmod verbal cuing      Exercises      General Comments        Pertinent Vitals/Pain Pain Assessment: Faces Faces Pain Scale: Hurts a little bit Pain Location: LEs, mostly fatigue Pain Descriptors / Indicators: Tiring Pain Intervention(s): Limited activity within patient's tolerance;Monitored during session    Home Living  Prior Function            PT Goals (current goals can now be found in the care plan section) Acute Rehab PT Goals Patient Stated Goal: go home PT Goal Formulation: With patient Time For Goal Achievement: 02/28/20 Potential to Achieve Goals: Good Progress towards PT goals: Not progressing toward goals - comment(limited tolerance for mobility)    Frequency    Min 3X/week      PT Plan Current plan remains appropriate    Co-evaluation              AM-PAC PT "6 Clicks" Mobility   Outcome Measure  Help needed turning from  your back to your side while in a flat bed without using bedrails?: A Little Help needed moving from lying on your back to sitting on the side of a flat bed without using bedrails?: A Little Help needed moving to and from a bed to a chair (including a wheelchair)?: A Little Help needed standing up from a chair using your arms (e.g., wheelchair or bedside chair)?: A Little Help needed to walk in hospital room?: A Little Help needed climbing 3-5 steps with a railing? : A Lot 6 Click Score: 17    End of Session Equipment Utilized During Treatment: Gait belt Activity Tolerance: Patient limited by fatigue Patient left: in bed;with call bell/phone within reach;with bed alarm set(sitting EOB) Nurse Communication: Mobility status PT Visit Diagnosis: Other abnormalities of gait and mobility (R26.89);Muscle weakness (generalized) (M62.81)     Time: 3428-7681 PT Time Calculation (min) (ACUTE ONLY): 25 min  Charges:  $Gait Training: 8-22 mins $Self Care/Home Management: 8-22                     Richrd Sox, PT Acute Rehabilitation Services Pager [phone]  Office [phone]    Tyrone Apple D Despina Hidden 02/18/2020, 3:43 PM

## 2020-02-18 NOTE — H&P (View-Only) (Signed)
Patient ID: Ryan Macdonald, male   DOB: [birth year], 86 y.o.   MRN: [id number]     Advanced Heart Failure Rounding Note  PCP-Cardiologist: [name], MD   Subjective:    Creatinine trending down 2.86>2.7   SBP 110s.      He remains in NSR on amiodarone.   E coli UTI noted.  Ceftriaxone started.   Denies SOB.     Objective:   Weight Range: 104.5 kg Body mass index is 36.08 kg/m.   Vital Signs:   Temp:  [97.8 F (36.6 C)-98.1 F (36.7 C)] 97.8 F (36.6 C) (03/22 0714) Pulse Rate:  [63-76] 76 (03/22 0714) Resp:  [16-18] 16 (03/22 0714) BP: (90-117)/(55-70) 117/68 (03/22 0714) SpO2:  [94 %-99 %] 94 % (03/22 0714) Weight:  [104.5 kg] 104.5 kg (03/22 0500) Last BM Date: 02/14/20  Weight change: Filed Weights   02/16/20 0612 02/17/20 0533 02/18/20 0500  Weight: 103.6 kg 104.5 kg 104.5 kg    Intake/Output:   Intake/Output Summary (Last 24 hours) at 02/18/2020 0818 Last data filed at 02/18/2020 0600 Gross per 24 hour  Intake 600 ml  Output 750 ml  Net -150 ml      Physical Exam    General:  Sitting in the chair. No resp difficulty HEENT: normal Neck: supple. JVP 7-8. Carotids 2+ bilat; no bruits. No lymphadenopathy or thryomegaly appreciated. Cor: PMI nondisplaced. Regular rate & rhythm. No rubs, gallops or murmurs. Lungs: clear Abdomen: soft, nontender, nondistended. No hepatosplenomegaly. No bruits or masses. Good bowel sounds. Extremities: no cyanosis, clubbing, rash,  R and LLE unna boots. Neuro: alert & orientedx3, cranial nerves grossly intact. moves all 4 extremities w/o difficulty. Affect pleasant   Telemetry   NSR 60s   Labs    CBC Recent Labs    02/16/20 0236 02/18/20 0119  WBC 6.4 6.4  NEUTROABS  --  4.2  HGB 8.5* 8.4*  HCT 26.7* 27.1*  MCV 92.1 93.1  PLT 247 231   Basic Metabolic Panel Recent Labs    96/29/52 0503 02/18/20 0119  NA 134* 135  K 4.9 4.6  CL 98 98  CO2 23 23  GLUCOSE 110* 114*  BUN 78* 86*  CREATININE  2.86* 2.74*  CALCIUM 9.1 8.8*   Liver Function Tests No results for input(s): AST, ALT, ALKPHOS, BILITOT, PROT, ALBUMIN in the last 72 hours. No results for input(s): LIPASE, AMYLASE in the last 72 hours. Cardiac Enzymes No results for input(s): CKTOTAL, CKMB, CKMBINDEX, TROPONINI in the last 72 hours.  BNP: BNP (last 3 results) Recent Labs    02/11/20 1739  BNP 1,569.0*    ProBNP (last 3 results) No results for input(s): PROBNP in the last 8760 hours.   D-Dimer No results for input(s): DDIMER in the last 72 hours. Hemoglobin A1C No results for input(s): HGBA1C in the last 72 hours. Fasting Lipid Panel No results for input(s): CHOL, HDL, LDLCALC, TRIG, CHOLHDL, LDLDIRECT in the last 72 hours. Thyroid Function Tests No results for input(s): TSH, T4TOTAL, T3FREE, THYROIDAB in the last 72 hours.  Invalid input(s): FREET3  Other results:   Imaging    No results found.   Medications:     Scheduled Medications: . allopurinol  200 mg Oral Daily  . amiodarone  400 mg Oral BID  . apixaban  2.5 mg Oral BID  . diclofenac Sodium  4 g Topical QID  . docusate sodium  100 mg Oral BID  . finasteride  5 mg Oral Daily  .  insulin aspart  0-15 Units Subcutaneous TID WC  . insulin aspart  0-5 Units Subcutaneous QHS  . isosorbide mononitrate  30 mg Oral Daily  . levothyroxine  125 mcg Oral QAC breakfast  . magnesium oxide  400 mg Oral Daily  . rosuvastatin  10 mg Oral q1800  . sertraline  25 mg Oral Daily  . sodium chloride flush  3 mL Intravenous Q12H  . tamsulosin  0.4 mg Oral QHS  . cyanocobalamin  1,000 mcg Oral Daily    Infusions: . sodium chloride    . cefTRIAXone (ROCEPHIN)  IV 1 g (02/17/20 1202)    PRN Medications: sodium chloride, acetaminophen, bisacodyl, guaiFENesin-codeine, HYDROcodone-acetaminophen, ipratropium-albuterol, methocarbamol, ondansetron (ZOFRAN) IV, sodium chloride flush   Assessment/Plan   1. Acute on chronic systolic CHF: Primarily  nonischemic cardiomyopathy. EF has been low since at least 2014.  He has single chamber Biotronik ICD. QRS not wide enough for CRT.Settings adjusted in the past to prevent RV pacing in the face of a long 1st degree AV block.  TEE in 2/21 with EF 20-25%, diffuse hypokinesis and mild LVH, mild RV systolic dysfunction. He was admitted after taking the wrong meds (no diuretic) for a number of days and developing volume overload.  He has converted to NSR on amiodarone.  Hydralazine cut back and Coreg stopped with soft BP, SBP still low in the 90s-100s range.  He is also off diuretics, creatinine up again 1.94 => 2.5 => 2.8=2.7  ?If low BP/AKI is related to infection/UTI or ?low output though not particularly symptomatic and not cool on exam.  - Volume status mildly elevated.  - Continue to hold torsemide spironolactone.  - With soft BP, off hydralazine and coreg.   -No ACEI or ARNI with angioedema on lisinopril, would hold off on ARB with CKD stage 3 and h/o AKI.  - RHC set up for tomorrow.   2. CAD: S/p PCI 07/2018 to pLAD but CAD does not explain his cardiomyopathy.  No chest pain.   - He is off Plavix now, on Eliquis for his atrial fibrillation.  - Continue Crestor 3.Atrial fibrillation: He had been in atrial fibrillation initially this admission but now back in NSR on amiodarone.   - Eliquis held for hematuria but later restarted.  On reduced dose with age and elevated creatinine.  - Continue amiodarone.  He has history of hypothyroidism with amiodarone in the past but is on Levoxyl.  4. AKI on CKD stage 3: As above, hold diuretics and will try to get BP higher.   5. Hypothyroidism: Suspect this is due to amiodarone.  He has been on Levoxyl.  6. Anemia: Fe deficiency with FOBT+.  He denies overt bleeding.  He has had feraheme.  GI saw him and plan outpatient workup.  - Hgb stable 8.4 7. ID: E coli UTI.  - Ceftriaxone started.   RHC tomorrow to assess hemodynamics. ---> 02/19/20 at 1200  Length  of Stay: 6  Amy Clegg, NP  02/18/2020, 8:18 AM  Advanced Heart Failure Team Pager [phone] (M-F; 7a - 4p)  Please contact [facility] for night-coverage after hours (4p -7a ) and weekends on [email or web address]  Patient seen with NP, agree with the above note.    Creatinine mildly improved at 2.7.  BP is improved, SBP in 110s.  No complaints today.  Urine is still dark but improved from prior.  No dyspnea but has not been very active.   General: NAD Neck: JVP difficult, ?8-9, no thyromegaly or  thyroid nodule.  Lungs: Clear to auscultation bilaterally with normal respiratory effort. CV: Nondisplaced PMI.  Heart regular S1/S2, no S3/S4, no murmur.  1+ edema to knees.   Abdomen: Soft, nontender, no hepatosplenomegaly, no distention.  Skin: Intact without lesions or rashes.  Neurologic: Alert and oriented x 3.  Psych: Normal affect. Extremities: No clubbing or cyanosis.  HEENT: Normal.   Plateau to improvement in creatinine.  We have cut back on his cardiac medications significantly due to low BP.  ?If this is due to vasodilation in the setting of E coli UTI or a sign of low output HF.  He is not cool on exam.   - Continue to hold Coreg, hydralazine, and spironolactone for now.  - Continue to hold diuretic for now.  - RHC tomorrow to assess filling pressures and cardiac output. Discussed risks/benefits with patient and he agrees to procedure.   Continue ceftriaxone for UTI. Needs to see urologist as outpatient with history of recurrent UTIs.   Loralie Champagne 02/18/2020 4:29 PM

## 2020-02-18 NOTE — Progress Notes (Addendum)
Patient ID: Ryan Macdonald, male   DOB: [birth year], 86 y.o.   MRN: [id number]     Advanced Heart Failure Rounding Note  PCP-Cardiologist: [name], MD   Subjective:    Creatinine trending down 2.86>2.7   SBP 110s.      He remains in NSR on amiodarone.   E coli UTI noted.  Ceftriaxone started.   Denies SOB.     Objective:   Weight Range: 104.5 kg Body mass index is 36.08 kg/m.   Vital Signs:   Temp:  [97.8 F (36.6 C)-98.1 F (36.7 C)] 97.8 F (36.6 C) (03/22 0714) Pulse Rate:  [63-76] 76 (03/22 0714) Resp:  [16-18] 16 (03/22 0714) BP: (90-117)/(55-70) 117/68 (03/22 0714) SpO2:  [94 %-99 %] 94 % (03/22 0714) Weight:  [104.5 kg] 104.5 kg (03/22 0500) Last BM Date: 02/14/20  Weight change: Filed Weights   02/16/20 0612 02/17/20 0533 02/18/20 0500  Weight: 103.6 kg 104.5 kg 104.5 kg    Intake/Output:   Intake/Output Summary (Last 24 hours) at 02/18/2020 0818 Last data filed at 02/18/2020 0600 Gross per 24 hour  Intake 600 ml  Output 750 ml  Net -150 ml      Physical Exam    General:  Sitting in the chair. No resp difficulty HEENT: normal Neck: supple. JVP 7-8. Carotids 2+ bilat; no bruits. No lymphadenopathy or thryomegaly appreciated. Cor: PMI nondisplaced. Regular rate & rhythm. No rubs, gallops or murmurs. Lungs: clear Abdomen: soft, nontender, nondistended. No hepatosplenomegaly. No bruits or masses. Good bowel sounds. Extremities: no cyanosis, clubbing, rash,  R and LLE unna boots. Neuro: alert & orientedx3, cranial nerves grossly intact. moves all 4 extremities w/o difficulty. Affect pleasant   Telemetry   NSR 60s   Labs    CBC Recent Labs    02/16/20 0236 02/18/20 0119  WBC 6.4 6.4  NEUTROABS  --  4.2  HGB 8.5* 8.4*  HCT 26.7* 27.1*  MCV 92.1 93.1  PLT 247 231   Basic Metabolic Panel Recent Labs    96/29/52 0503 02/18/20 0119  NA 134* 135  K 4.9 4.6  CL 98 98  CO2 23 23  GLUCOSE 110* 114*  BUN 78* 86*  CREATININE  2.86* 2.74*  CALCIUM 9.1 8.8*   Liver Function Tests No results for input(s): AST, ALT, ALKPHOS, BILITOT, PROT, ALBUMIN in the last 72 hours. No results for input(s): LIPASE, AMYLASE in the last 72 hours. Cardiac Enzymes No results for input(s): CKTOTAL, CKMB, CKMBINDEX, TROPONINI in the last 72 hours.  BNP: BNP (last 3 results) Recent Labs    02/11/20 1739  BNP 1,569.0*    ProBNP (last 3 results) No results for input(s): PROBNP in the last 8760 hours.   D-Dimer No results for input(s): DDIMER in the last 72 hours. Hemoglobin A1C No results for input(s): HGBA1C in the last 72 hours. Fasting Lipid Panel No results for input(s): CHOL, HDL, LDLCALC, TRIG, CHOLHDL, LDLDIRECT in the last 72 hours. Thyroid Function Tests No results for input(s): TSH, T4TOTAL, T3FREE, THYROIDAB in the last 72 hours.  Invalid input(s): FREET3  Other results:   Imaging    No results found.   Medications:     Scheduled Medications: . allopurinol  200 mg Oral Daily  . amiodarone  400 mg Oral BID  . apixaban  2.5 mg Oral BID  . diclofenac Sodium  4 g Topical QID  . docusate sodium  100 mg Oral BID  . finasteride  5 mg Oral Daily  .  insulin aspart  0-15 Units Subcutaneous TID WC  . insulin aspart  0-5 Units Subcutaneous QHS  . isosorbide mononitrate  30 mg Oral Daily  . levothyroxine  125 mcg Oral QAC breakfast  . magnesium oxide  400 mg Oral Daily  . rosuvastatin  10 mg Oral q1800  . sertraline  25 mg Oral Daily  . sodium chloride flush  3 mL Intravenous Q12H  . tamsulosin  0.4 mg Oral QHS  . cyanocobalamin  1,000 mcg Oral Daily    Infusions: . sodium chloride    . cefTRIAXone (ROCEPHIN)  IV 1 g (02/17/20 1202)    PRN Medications: sodium chloride, acetaminophen, bisacodyl, guaiFENesin-codeine, HYDROcodone-acetaminophen, ipratropium-albuterol, methocarbamol, ondansetron (ZOFRAN) IV, sodium chloride flush   Assessment/Plan   1. Acute on chronic systolic CHF: Primarily  nonischemic cardiomyopathy. EF has been low since at least 2014.  He has single chamber Biotronik ICD. QRS not wide enough for CRT.Settings adjusted in the past to prevent RV pacing in the face of a long 1st degree AV block.  TEE in 2/21 with EF 20-25%, diffuse hypokinesis and mild LVH, mild RV systolic dysfunction. He was admitted after taking the wrong meds (no diuretic) for a number of days and developing volume overload.  He has converted to NSR on amiodarone.  Hydralazine cut back and Coreg stopped with soft BP, SBP still low in the 90s-100s range.  He is also off diuretics, creatinine up again 1.94 => 2.5 => 2.8=2.7  ?If low BP/AKI is related to infection/UTI or ?low output though not particularly symptomatic and not cool on exam.  - Volume status mildly elevated.  - Continue to hold torsemide spironolactone.  - With soft BP, off hydralazine and coreg.   -No ACEI or ARNI with angioedema on lisinopril, would hold off on ARB with CKD stage 3 and h/o AKI.  - RHC set up for tomorrow.   2. CAD: S/p PCI 07/2018 to pLAD but CAD does not explain his cardiomyopathy.  No chest pain.   - He is off Plavix now, on Eliquis for his atrial fibrillation.  - Continue Crestor 3.Atrial fibrillation: He had been in atrial fibrillation initially this admission but now back in NSR on amiodarone.   - Eliquis held for hematuria but later restarted.  On reduced dose with age and elevated creatinine.  - Continue amiodarone.  He has history of hypothyroidism with amiodarone in the past but is on Levoxyl.  4. AKI on CKD stage 3: As above, hold diuretics and will try to get BP higher.   5. Hypothyroidism: Suspect this is due to amiodarone.  He has been on Levoxyl.  6. Anemia: Fe deficiency with FOBT+.  He denies overt bleeding.  He has had feraheme.  GI saw him and plan outpatient workup.  - Hgb stable 8.4 7. ID: E coli UTI.  - Ceftriaxone started.   RHC tomorrow to assess hemodynamics. ---> 02/19/20 at 1200  Length  of Stay: 6  Amy Clegg, NP  02/18/2020, 8:18 AM  Advanced Heart Failure Team Pager [phone] (M-F; 7a - 4p)  Please contact [facility] for night-coverage after hours (4p -7a ) and weekends on [email or web address]  Patient seen with NP, agree with the above note.    Creatinine mildly improved at 2.7.  BP is improved, SBP in 110s.  No complaints today.  Urine is still dark but improved from prior.  No dyspnea but has not been very active.   General: NAD Neck: JVP difficult, ?8-9, no thyromegaly or   thyroid nodule.  Lungs: Clear to auscultation bilaterally with normal respiratory effort. CV: Nondisplaced PMI.  Heart regular S1/S2, no S3/S4, no murmur.  1+ edema to knees.   Abdomen: Soft, nontender, no hepatosplenomegaly, no distention.  Skin: Intact without lesions or rashes.  Neurologic: Alert and oriented x 3.  Psych: Normal affect. Extremities: No clubbing or cyanosis.  HEENT: Normal.   Plateau to improvement in creatinine.  We have cut back on his cardiac medications significantly due to low BP.  ?If this is due to vasodilation in the setting of E coli UTI or a sign of low output HF.  He is not cool on exam.   - Continue to hold Coreg, hydralazine, and spironolactone for now.  - Continue to hold diuretic for now.  - RHC tomorrow to assess filling pressures and cardiac output. Discussed risks/benefits with patient and he agrees to procedure.   Continue ceftriaxone for UTI. Needs to see urologist as outpatient with history of recurrent UTIs.   Loralie Champagne 02/18/2020 4:29 PM

## 2020-02-18 NOTE — Progress Notes (Addendum)
Marland Kitchen  PROGRESS NOTE    Ryan Macdonald  JOA:[id number] DOB: 1934/07/10 DOA: 02/11/2020 PCP: [facility]   Brief Narrative:   86 year old man presented with bilateral lower extremity edema and was found to have acute on chronic heart failure and atrial fibrillation with rapid ventricular response.  Followed by advanced heart failure team, treated with diuresis and rhythm managed with amiodarone.  Continues to require close monitoring as he remains volume overload.  3/22: Reports constipation today. Denies any other complaints. Awaiting decision on RHC today. Will switch his abx to augmentin  to complete a 5 - 7 course of abx for his e coli UTI. He was not NPO ON and he was eating b'fast this AM, so we'll sneak that first dose in now. He is otherwise doing well today. Long conversation about his job with the [employer]. He is in good spirits.   Assessment & Plan:   Principal Problem:   Acute on chronic heart failure (HCC) Active Problems:   Obesity (BMI 30-39.9)   Essential hypertension   Atrial fibrillation (HCC)   Chronic renal insufficiency, stage 3 (moderate) (HCC)   Diabetes mellitus type 2 in obese (HCC)   Morbid obesity (HCC)   Constipation   Anemia   Hematuria   CKD (chronic kidney disease), stage III  Acute on chronic systolic CHF primarily nonischemic cardiomyopathy ?Cardiorenal syndrome     - still with edema     - cards onboard, considering RHC today, but pt was NPO ON (had breakfast this AM)     - Pt off carvedilol, diuretics and hydralazine secondary to low blood pressure.     - SCr is stable this AM; continue to trend  Acute kidney injury superimposed on CKD stage IIIb     - Renal function worsening off diuretics.     - Continue to hold diuretics, antihypertensives as per cardiology.     - Continue strict I's/O.  Atrial fibrillation, paroxysmal     - had episodes of atrial fibrillation with RVR     - he was started on amiodarone and converted to  sinus rhythm.     - continue amiodarone per cardiology; continue eliquis  Acute E. coli UTI with hematuria     - pansensative e coli     - change him to augmentin 500 BID to complete 5 - 7 abx tx  Diabetes mellitus type 2, on oral agents only.     - Hemoglobin A1c 5.7.     - Continue sliding scale insulin.     - glucose looks good this AM  Normocytic Anemia, fecal occult blood positive     - Was seen by gastroenterology with recommendation for outpatient follow-up.     - Anemia panel was equivocal and he was treated with [facility] March 17.      - He will follow-up with [facility] as an outpatient.  Essential hypertension     - antihypertensives held per cardiology.     - BP looks ok this AM  DVT prophylaxis: Eliquis Code Status: FULL Family Communication: Spoke with wife Ryan Macdonald [phone]) by phone and updated with plan. Disposition Plan: Awaiting final cards recs. Renal function stable at this time but is still edematous.   Consultants:   Cardiology  Antimicrobials:  . Augmentin   ROS:  Reports constipation. . Remainder 10-pt ROS is negative for all not previously mentioned.  Subjective: "My son was there for [place] on [place]. I can't even remember which ones  I was there for."  Objective: Vitals:   02/17/20 1746 02/18/20 0010 02/18/20 0500 02/18/20 0714  BP: 110/68 116/70  117/68  Pulse: 71 72  76  Resp: 18 17  16   Temp: 98.1 F (36.7 C) 98.1 F (36.7 C)  97.8 F (36.6 C)  TempSrc: Oral Oral  Oral  SpO2: 97% 95%  94%  Weight:   104.5 kg   Height:        Intake/Output Summary (Last 24 hours) at 02/18/2020 02/20/2020 Last data filed at 02/18/2020 0600 Gross per 24 hour  Intake 600 ml  Output 750 ml  Net -150 ml   Filed Weights   02/16/20 0612 02/17/20 0533 02/18/20 0500  Weight: 103.6 kg 104.5 kg 104.5 kg    Examination:  General: 86 y.o. male resting in chair in NAD Cardiovascular: RRR, +S1, S2, no m/g/r Respiratory: CTABL, no w/r/r, normal  WOB GI: BS+, NDNT, no masses noted, no organomegaly noted MSK: No c/c, ble edema Neuro: Alert to name, follows commands Psyc: Appropriate interaction and affect, calm/cooperative   Data Reviewed: I have personally reviewed following labs and imaging studies.  CBC: Recent Labs  Lab 02/13/20 0328 02/14/20 0536 02/15/20 0356 02/16/20 0236 02/18/20 0119  WBC 6.0 5.4 7.0 6.4 6.4  NEUTROABS  --   --   --   --  4.2  HGB 8.6* 8.2* 8.2* 8.5* 8.4*  HCT 27.1* 25.6* 25.9* 26.7* 27.1*  MCV 90.9 90.5 90.6 92.1 93.1  PLT 273 236 239 247 231   Basic Metabolic Panel: Recent Labs  Lab 02/14/20 0536 02/15/20 0356 02/16/20 0236 02/17/20 0503 02/18/20 0119  NA 137 137 133* 134* 135  K 4.5 4.5 4.7 4.9 4.6  CL 100 101 98 98 98  CO2 25 23 22 23 23   GLUCOSE 123* 121* 118* 110* 114*  BUN 56* 62* 71* 78* 86*  CREATININE 1.73* 1.94* 2.52* 2.86* 2.74*  CALCIUM 9.0 9.3 9.0 9.1 8.8*   GFR: Estimated Creatinine Clearance: 23.5 mL/min (A) (by C-G formula based on SCr of 2.74 mg/dL (H)). Liver Function Tests: No results for input(s): AST, ALT, ALKPHOS, BILITOT, PROT, ALBUMIN in the last 168 hours. No results for input(s): LIPASE, AMYLASE in the last 168 hours. No results for input(s): AMMONIA in the last 168 hours. Coagulation Profile: Recent Labs  Lab 02/11/20 1424  INR 1.2   Cardiac Enzymes: No results for input(s): CKTOTAL, CKMB, CKMBINDEX, TROPONINI in the last 168 hours. BNP (last 3 results) No results for input(s): PROBNP in the last 8760 hours. HbA1C: No results for input(s): HGBA1C in the last 72 hours. CBG: Recent Labs  Lab 02/16/20 2135 02/17/20 0602 02/17/20 1058 02/17/20 2133 02/18/20 0648  GLUCAP 118* 107* 105* 174* 113*   Lipid Profile: No results for input(s): CHOL, HDL, LDLCALC, TRIG, CHOLHDL, LDLDIRECT in the last 72 hours. Thyroid Function Tests: No results for input(s): TSH, T4TOTAL, FREET4, T3FREE, THYROIDAB in the last 72 hours. Anemia Panel: No results  for input(s): VITAMINB12, FOLATE, FERRITIN, TIBC, IRON, RETICCTPCT in the last 72 hours. Sepsis Labs: No results for input(s): PROCALCITON, LATICACIDVEN in the last 168 hours.  Recent Results (from the past 240 hour(s))  SARS CORONAVIRUS 2 (TAT 6-24 HRS) Nasopharyngeal Nasopharyngeal Swab     Status: None   Collection Time: 02/11/20  6:09 PM   Specimen: Nasopharyngeal Swab  Result Value Ref Range Status   SARS Coronavirus 2 NEGATIVE NEGATIVE Final    Comment: (NOTE) SARS-CoV-2 target nucleic acids are NOT DETECTED. The SARS-CoV-2  RNA is generally detectable in upper and lower respiratory specimens during the acute phase of infection. Negative results do not preclude SARS-CoV-2 infection, do not rule out co-infections with other pathogens, and should not be used as the sole basis for treatment or other patient management decisions. Negative results must be combined with clinical observations, patient history, and epidemiological information. The expected result is Negative. Fact Sheet for Patients: HairSlick.no Fact Sheet for Healthcare Providers: quierodirigir.com This test is not yet approved or cleared by the [place] FDA and  has been authorized for detection and/or diagnosis of SARS-CoV-2 by FDA under an Emergency Use Authorization (EUA). This EUA will remain  in effect (meaning this test can be used) for the duration of the COVID-19 declaration under Section 56 4(b)(1) of the Act, 21 U.S.C. section 360bbb-3(b)(1), unless the authorization is terminated or revoked sooner. Performed at [facility], [address] [place], [place] [address]   Culture, Urine     Status: Abnormal   Collection Time: 02/15/20 11:39 AM   Specimen: Urine, Clean Catch  Result Value Ref Range Status   Specimen Description URINE, CLEAN CATCH  Final   Special Requests   Final    NONE Performed at [facility], [address] [place], [place] [address]    Culture >=100,000 COLONIES/mL ESCHERICHIA COLI (A)  Final   Report Status 02/17/2020 FINAL  Final   Organism ID, Bacteria ESCHERICHIA COLI (A)  Final      Susceptibility   Escherichia coli - MIC*    AMPICILLIN <=2 SENSITIVE Sensitive     CEFAZOLIN <=4 SENSITIVE Sensitive     CEFTRIAXONE <=0.25 SENSITIVE Sensitive     CIPROFLOXACIN <=0.25 SENSITIVE Sensitive     GENTAMICIN <=1 SENSITIVE Sensitive     IMIPENEM <=0.25 SENSITIVE Sensitive     NITROFURANTOIN <=16 SENSITIVE Sensitive     TRIMETH/SULFA <=20 SENSITIVE Sensitive     AMPICILLIN/SULBACTAM <=2 SENSITIVE Sensitive     PIP/TAZO <=4 SENSITIVE Sensitive     * >=100,000 COLONIES/mL ESCHERICHIA COLI      Radiology Studies: No results found.   Scheduled Meds: . allopurinol  200 mg Oral Daily  . amiodarone  400 mg Oral BID  . apixaban  2.5 mg Oral BID  . diclofenac Sodium  4 g Topical QID  . docusate sodium  100 mg Oral BID  . finasteride  5 mg Oral Daily  . insulin aspart  0-15 Units Subcutaneous TID WC  . insulin aspart  0-5 Units Subcutaneous QHS  . isosorbide mononitrate  30 mg Oral Daily  . levothyroxine  125 mcg Oral QAC breakfast  . magnesium oxide  400 mg Oral Daily  . rosuvastatin  10 mg Oral q1800  . sertraline  25 mg Oral Daily  . sodium chloride flush  3 mL Intravenous Q12H  . tamsulosin  0.4 mg Oral QHS  . cyanocobalamin  1,000 mcg Oral Daily   Continuous Infusions: . sodium chloride    . cefTRIAXone (ROCEPHIN)  IV 1 g (02/17/20 1202)     LOS: 6 days    Time spent: 25 minutes spent in the coordination of care today.    Teddy Spike, DO [facility]  If 7PM-7AM, please contact night-coverage [email or web address] 02/18/2020, 8:23 AM

## 2020-02-19 ENCOUNTER — Encounter (HOSPITAL_COMMUNITY)
Admission: EM | Disposition: A | Discharge status: Home Health | Payer: Self-pay | Source: Home / Self Care | Attending: Family Medicine

## 2020-02-19 ENCOUNTER — Inpatient Hospital Stay: Payer: Self-pay

## 2020-02-19 DIAGNOSIS — I509 Heart failure, unspecified: Secondary | ICD-10-CM

## 2020-02-19 HISTORY — PX: RIGHT HEART CATH: CATH118263

## 2020-02-19 LAB — SURGICAL PCR SCREEN
MRSA, PCR: POSITIVE — AB
Staphylococcus aureus: POSITIVE — AB

## 2020-02-19 LAB — POCT I-STAT EG7
Acid-Base Excess: 1 mmol/L (ref 0.0–2.0)
Acid-Base Excess: 1 mmol/L (ref 0.0–2.0)
Bicarbonate: 27.5 mmol/L (ref 20.0–28.0)
Bicarbonate: 27.6 mmol/L (ref 20.0–28.0)
Calcium, Ion: 1.18 mmol/L (ref 1.15–1.40)
Calcium, Ion: 1.24 mmol/L (ref 1.15–1.40)
HCT: 28 % — ABNORMAL LOW (ref 39.0–52.0)
HCT: 29 % — ABNORMAL LOW (ref 39.0–52.0)
Hemoglobin: 9.5 g/dL — ABNORMAL LOW (ref 13.0–17.0)
Hemoglobin: 9.9 g/dL — ABNORMAL LOW (ref 13.0–17.0)
O2 Saturation: 67 %
O2 Saturation: 68 %
Potassium: 4.5 mmol/L (ref 3.5–5.1)
Potassium: 4.7 mmol/L (ref 3.5–5.1)
Sodium: 140 mmol/L (ref 135–145)
Sodium: 141 mmol/L (ref 135–145)
TCO2: 29 mmol/L (ref 22–32)
TCO2: 29 mmol/L (ref 22–32)
pCO2, Ven: 51.1 mmHg (ref 44.0–60.0)
pCO2, Ven: 51.3 mmHg (ref 44.0–60.0)
pH, Ven: 7.338 (ref 7.250–7.430)
pH, Ven: 7.339 (ref 7.250–7.430)
pO2, Ven: 38 mmHg (ref 32.0–45.0)
pO2, Ven: 38 mmHg (ref 32.0–45.0)

## 2020-02-19 LAB — BASIC METABOLIC PANEL
Anion gap: 9 (ref 5–15)
BUN: 79 mg/dL — ABNORMAL HIGH (ref 8–23)
CO2: 24 mmol/L (ref 22–32)
Calcium: 8.8 mg/dL — ABNORMAL LOW (ref 8.9–10.3)
Chloride: 103 mmol/L (ref 98–111)
Creatinine, Ser: 2.23 mg/dL — ABNORMAL HIGH (ref 0.61–1.24)
GFR calc Af Amer: 30 mL/min — ABNORMAL LOW (ref >60)
GFR calc non Af Amer: 26 mL/min — ABNORMAL LOW (ref >60)
Glucose, Bld: 118 mg/dL — ABNORMAL HIGH (ref 70–99)
Potassium: 4.5 mmol/L (ref 3.5–5.1)
Sodium: 136 mmol/L (ref 135–145)

## 2020-02-19 LAB — CBC WITH DIFFERENTIAL/PLATELET
Abs Immature Granulocytes: 0.03 10*3/uL (ref 0.00–0.07)
Basophils Absolute: 0 10*3/uL (ref 0.0–0.1)
Basophils Relative: 0 %
Eosinophils Absolute: 0.1 10*3/uL (ref 0.0–0.5)
Eosinophils Relative: 2 %
HCT: 27.3 % — ABNORMAL LOW (ref 39.0–52.0)
Hemoglobin: 8.5 g/dL — ABNORMAL LOW (ref 13.0–17.0)
Immature Granulocytes: 1 %
Lymphocytes Relative: 20 %
Lymphs Abs: 1.3 10*3/uL (ref 0.7–4.0)
MCH: 29.1 pg (ref 26.0–34.0)
MCHC: 31.1 g/dL (ref 30.0–36.0)
MCV: 93.5 fL (ref 80.0–100.0)
Monocytes Absolute: 0.8 10*3/uL (ref 0.1–1.0)
Monocytes Relative: 13 %
Neutro Abs: 4.1 10*3/uL (ref 1.7–7.7)
Neutrophils Relative %: 64 %
Platelets: 215 10*3/uL (ref 150–400)
RBC: 2.92 MIL/uL — ABNORMAL LOW (ref 4.22–5.81)
RDW: 18.1 % — ABNORMAL HIGH (ref 11.5–15.5)
WBC: 6.4 10*3/uL (ref 4.0–10.5)
nRBC: 0.3 % — ABNORMAL HIGH (ref 0.0–0.2)

## 2020-02-19 LAB — GLUCOSE, CAPILLARY
Glucose-Capillary: 114 mg/dL — ABNORMAL HIGH (ref 70–99)
Glucose-Capillary: 116 mg/dL — ABNORMAL HIGH (ref 70–99)
Glucose-Capillary: 126 mg/dL — ABNORMAL HIGH (ref 70–99)
Glucose-Capillary: 127 mg/dL — ABNORMAL HIGH (ref 70–99)
Glucose-Capillary: 145 mg/dL — ABNORMAL HIGH (ref 70–99)

## 2020-02-19 SURGERY — RIGHT HEART CATH
Anesthesia: LOCAL

## 2020-02-19 MED ORDER — AMIODARONE HCL IN DEXTROSE 360-4.14 MG/200ML-% IV SOLN
60.0000 mg/h | INTRAVENOUS | Status: AC
  Administered 2020-02-19: 60 mg/h via INTRAVENOUS

## 2020-02-19 MED ORDER — LIDOCAINE HCL (PF) 1 % IJ SOLN
INTRAMUSCULAR | Status: DC | PRN
  Administered 2020-02-19: 2 mL

## 2020-02-19 MED ORDER — MILRINONE LACTATE IN DEXTROSE 20-5 MG/100ML-% IV SOLN
0.1250 ug/kg/min | INTRAVENOUS | Status: DC
  Administered 2020-02-19 – 2020-02-21 (×2): 0.125 ug/kg/min via INTRAVENOUS
  Filled 2020-02-19: qty 100

## 2020-02-19 MED ORDER — ASPIRIN 81 MG PO CHEW
81.0000 mg | CHEWABLE_TABLET | ORAL | Status: AC
  Administered 2020-02-19: 81 mg via ORAL
  Filled 2020-02-19: qty 1

## 2020-02-19 MED ORDER — AMIODARONE HCL IN DEXTROSE 360-4.14 MG/200ML-% IV SOLN
30.0000 mg/h | INTRAVENOUS | Status: DC
  Filled 2020-02-19: qty 200

## 2020-02-19 MED ORDER — SODIUM CHLORIDE 0.9% FLUSH: 3.0000 mL | INTRAVENOUS | Status: DC | PRN

## 2020-02-19 MED ORDER — SODIUM CHLORIDE 0.9% FLUSH: 10.0000 mL | INTRAVENOUS | Status: DC | PRN

## 2020-02-19 MED ORDER — SODIUM CHLORIDE 0.9 % IV SOLN: INTRAVENOUS | Status: DC

## 2020-02-19 MED ORDER — SODIUM CHLORIDE 0.9 % IV SOLN: 250.0000 mL | INTRAVENOUS | Status: DC | PRN

## 2020-02-19 MED ORDER — HEPARIN (PORCINE) IN NACL 1000-0.9 UT/500ML-% IV SOLN
INTRAVENOUS | Status: AC
  Filled 2020-02-19: qty 500

## 2020-02-19 MED ORDER — HEPARIN (PORCINE) IN NACL 1000-0.9 UT/500ML-% IV SOLN
INTRAVENOUS | Status: DC | PRN
  Administered 2020-02-19: 500 mL

## 2020-02-19 MED ORDER — LIDOCAINE HCL (PF) 1 % IJ SOLN
INTRAMUSCULAR | Status: AC
  Filled 2020-02-19: qty 30

## 2020-02-19 MED ORDER — FUROSEMIDE 10 MG/ML IJ SOLN
INTRAMUSCULAR | Status: AC
  Filled 2020-02-19: qty 4

## 2020-02-19 MED ORDER — FUROSEMIDE 10 MG/ML IJ SOLN
40.0000 mg | Freq: Once | INTRAMUSCULAR | Status: AC
  Administered 2020-02-19: 40 mg via INTRAVENOUS
  Filled 2020-02-19: qty 4

## 2020-02-19 MED ORDER — ASPIRIN 81 MG PO CHEW: 81.0000 mg | CHEWABLE_TABLET | ORAL | Status: DC

## 2020-02-19 MED ORDER — MUPIROCIN 2 % EX OINT
1.0000 "application " | TOPICAL_OINTMENT | Freq: Two times a day (BID) | CUTANEOUS | Status: AC
  Administered 2020-02-19 – 2020-02-23 (×10): 1 via NASAL
  Filled 2020-02-19 (×2): qty 22

## 2020-02-19 MED ORDER — AMIODARONE HCL IN DEXTROSE 360-4.14 MG/200ML-% IV SOLN
30.0000 mg/h | INTRAVENOUS | Status: DC
  Administered 2020-02-20 – 2020-02-22 (×4): 30 mg/h via INTRAVENOUS
  Filled 2020-02-19 (×5): qty 200

## 2020-02-19 MED ORDER — CHLORHEXIDINE GLUCONATE CLOTH 2 % EX PADS
6.0000 | MEDICATED_PAD | Freq: Every day | CUTANEOUS | Status: DC
  Administered 2020-02-20 – 2020-02-24 (×5): 6 via TOPICAL

## 2020-02-19 MED ORDER — FUROSEMIDE 10 MG/ML IJ SOLN
12.0000 mg/h | INTRAVENOUS | Status: DC
  Administered 2020-02-19 – 2020-02-22 (×3): 12 mg/h via INTRAVENOUS
  Filled 2020-02-19 (×4): qty 25
  Filled 2020-02-19: qty 21

## 2020-02-19 SURGICAL SUPPLY — 7 items
CATH BALLN WEDGE 5F 110CM (CATHETERS) ×1 IMPLANT
GUIDEWIRE .025 260CM (WIRE) ×1 IMPLANT
HOVERMATT SINGLE USE (MISCELLANEOUS) ×1 IMPLANT
PACK CARDIAC CATHETERIZATION (CUSTOM PROCEDURE TRAY) ×2 IMPLANT
SHEATH GLIDE SLENDER 4/5FR (SHEATH) ×1 IMPLANT
TRANSDUCER W/STOPCOCK (MISCELLANEOUS) ×2 IMPLANT
WIRE EMERALD 3MM-J .025X260CM (WIRE) ×1 IMPLANT

## 2020-02-19 NOTE — Progress Notes (Signed)
Marland Kitchen  PROGRESS NOTE    Julias Mould  GYJ:[id number] DOB: Jan 16, 1934 DOA: 02/11/2020 PCP: [facility]   Brief Narrative:   86 year old man presented with bilateral lower extremity edema and was found to have acute on chronic heart failure and atrial fibrillation with rapid ventricular response. Followed by advanced heart failure team, treated with diuresis and rhythm managed with amiodarone. Continues to require close monitoring as he remains volume overload.  3/23: Augmentin through 3/27. RHC today. See cards note. Will start diuresis today: milrinone then lasix. Holding Coreg, hydralazine, imdur for now. Appreciate their assistance.    Assessment & Plan:   Principal Problem:   Acute on chronic heart failure (HCC) Active Problems:   Obesity (BMI 30-39.9)   Essential hypertension   Atrial fibrillation (HCC)   Chronic renal insufficiency, stage 3 (moderate) (HCC)   Diabetes mellitus type 2 in obese (HCC)   Morbid obesity (HCC)   Constipation   Anemia   Hematuria   CKD (chronic kidney disease), stage III  Acute on chronic systolic CHF primarily nonischemic cardiomyopathy ?Cardiorenal syndrome     - still with edema     - cards onboard, considering RHC today, but pt was NPO ON (had breakfast this AM)     - Pt off carvedilol, diuretics and hydralazine secondary to low blood pressure.     - SCr is stable this AM; continue to trend     - 3/23: edema is a little worse this AM and he seems to have increase work of breathing this AM; of note, pt and wife say that he periodically uses O2 at home. He has been able to stay off his O2 for a short while prior to this visit  Acute kidney injury superimposed on CKD stage IIIb     - Renal function worsening off diuretics.     - Continue to hold diuretics, antihypertensives as per cardiology.     - Continue strict I's/O.     - 3/23: Scr 2.23 this AM; will be started on diuresis  Atrial fibrillation, paroxysmal     - had  episodes of atrial fibrillation with RVR     - he was started on amiodarone and converted to sinus rhythm.     - continue amiodarone per cardiology; continue eliquis  Acute E. coli UTI with hematuria     - pansensative e coli     - change him to augmentin 500 BID to complete 7 abx tx     - 3/23: continue augmentin through 3/26  Diabetes mellitus type 2, on oral agents only.     - Hemoglobin A1c 5.7.     - Continue sliding scale insulin.     - 3/23: glucose is ok this AM  Normocytic Anemia, fecal occult blood positive     - Was seen by gastroenterology with recommendation for outpatient follow-up.     - Anemia panel was equivocal and he was treated with [facility] March 17.      - He will follow-up with [facility] as an outpatient.  Essential hypertension     - antihypertensives held per cardiology.     - 3/23: BP is ok this AM.   DVT prophylaxis: eliquis Code Status: FULL Family Communication: Spoke with wife Alexandra Posadas [phone]) by phone w/ update Disposition Plan: Not medically stable for discharge at this time d/t ongoing need for IV diuresis and not a baseline breathing.   Consultants:   Cardiology  Antimicrobials:  . Augmentin  ROS:  Reports dyspnea, N, V . Remainder 10-pt ROS is negative for all not previously mentioned.  Subjective: "I use 3L at home."  Objective: Vitals:   02/19/20 1340 02/19/20 1355 02/19/20 1410 02/19/20 1425  BP: 130/61 127/65 119/77 120/68  Pulse: 65 67 69 68  Resp: 18 (!) 21 20 20   Temp:      TempSrc:      SpO2: 100% 98% 98% 98%  Weight:      Height:        Intake/Output Summary (Last 24 hours) at 02/19/2020 1440 Last data filed at 02/19/2020 0400 Gross per 24 hour  Intake 220 ml  Output 900 ml  Net -680 ml   Filed Weights   02/17/20 0533 02/18/20 0500 02/19/20 0347  Weight: 104.5 kg 104.5 kg 104.3 kg    Examination:  General: 86 y.o. male resting in bed in NAD Cardiovascular: RRR, +S1, S2, no m/g/r, equal  pulses throughout Respiratory: decreased at bases, no w/r/r, increased WOB GI: BS+, NDNT, no masses noted, no organomegaly noted MSK: No c/c; ble edema Neuro: A&O x 3, no focal deficits Psyc: Appropriate interaction and affect, calm/cooperative   Data Reviewed: I have personally reviewed following labs and imaging studies.  CBC: Recent Labs  Lab 02/14/20 0536 02/15/20 0356 02/16/20 0236 02/18/20 0119 02/19/20 0435  WBC 5.4 7.0 6.4 6.4 6.4  NEUTROABS  --   --   --  4.2 4.1  HGB 8.2* 8.2* 8.5* 8.4* 8.5*  HCT 25.6* 25.9* 26.7* 27.1* 27.3*  MCV 90.5 90.6 92.1 93.1 93.5  PLT 236 239 247 231 215   Basic Metabolic Panel: Recent Labs  Lab 02/15/20 0356 02/16/20 0236 02/17/20 0503 02/18/20 0119 02/19/20 0435  NA 137 133* 134* 135 136  K 4.5 4.7 4.9 4.6 4.5  CL 101 98 98 98 103  CO2 23 22 23 23 24   GLUCOSE 121* 118* 110* 114* 118*  BUN 62* 71* 78* 86* 79*  CREATININE 1.94* 2.52* 2.86* 2.74* 2.23*  CALCIUM 9.3 9.0 9.1 8.8* 8.8*   GFR: Estimated Creatinine Clearance: 28.9 mL/min (A) (by C-G formula based on SCr of 2.23 mg/dL (H)). Liver Function Tests: No results for input(s): AST, ALT, ALKPHOS, BILITOT, PROT, ALBUMIN in the last 168 hours. No results for input(s): LIPASE, AMYLASE in the last 168 hours. No results for input(s): AMMONIA in the last 168 hours. Coagulation Profile: No results for input(s): INR, PROTIME in the last 168 hours. Cardiac Enzymes: No results for input(s): CKTOTAL, CKMB, CKMBINDEX, TROPONINI in the last 168 hours. BNP (last 3 results) No results for input(s): PROBNP in the last 8760 hours. HbA1C: No results for input(s): HGBA1C in the last 72 hours. CBG: Recent Labs  Lab 02/18/20 1553 02/18/20 2108 02/19/20 0628 02/19/20 1122 02/19/20 1405  GLUCAP 127* 124* 114* 116* 126*   Lipid Profile: No results for input(s): CHOL, HDL, LDLCALC, TRIG, CHOLHDL, LDLDIRECT in the last 72 hours. Thyroid Function Tests: No results for input(s): TSH,  T4TOTAL, FREET4, T3FREE, THYROIDAB in the last 72 hours. Anemia Panel: No results for input(s): VITAMINB12, FOLATE, FERRITIN, TIBC, IRON, RETICCTPCT in the last 72 hours. Sepsis Labs: No results for input(s): PROCALCITON, LATICACIDVEN in the last 168 hours.  Recent Results (from the past 240 hour(s))  SARS CORONAVIRUS 2 (TAT 6-24 HRS) Nasopharyngeal Nasopharyngeal Swab     Status: None   Collection Time: 02/11/20  6:09 PM   Specimen: Nasopharyngeal Swab  Result Value Ref Range Status   SARS Coronavirus 2 NEGATIVE NEGATIVE  Final    Comment: (NOTE) SARS-CoV-2 target nucleic acids are NOT DETECTED. The SARS-CoV-2 RNA is generally detectable in upper and lower respiratory specimens during the acute phase of infection. Negative results do not preclude SARS-CoV-2 infection, do not rule out co-infections with other pathogens, and should not be used as the sole basis for treatment or other patient management decisions. Negative results must be combined with clinical observations, patient history, and epidemiological information. The expected result is Negative. Fact Sheet for Patients: HairSlick.no Fact Sheet for Healthcare Providers: quierodirigir.com This test is not yet approved or cleared by the [place] FDA and  has been authorized for detection and/or diagnosis of SARS-CoV-2 by FDA under an Emergency Use Authorization (EUA). This EUA will remain  in effect (meaning this test can be used) for the duration of the COVID-19 declaration under Section 56 4(b)(1) of the Act, 21 U.S.C. section 360bbb-3(b)(1), unless the authorization is terminated or revoked sooner. Performed at [facility], [address], [place], [place] [address]   Culture, Urine     Status: Abnormal   Collection Time: 02/15/20 11:39 AM   Specimen: Urine, Clean Catch  Result Value Ref Range Status   Specimen Description URINE, CLEAN CATCH  Final    Special Requests   Final    NONE Performed at [facility], [address] [place], [place] [address]    Culture >=100,000 COLONIES/mL ESCHERICHIA COLI (A)  Final   Report Status 02/17/2020 FINAL  Final   Organism ID, Bacteria ESCHERICHIA COLI (A)  Final      Susceptibility   Escherichia coli - MIC*    AMPICILLIN <=2 SENSITIVE Sensitive     CEFAZOLIN <=4 SENSITIVE Sensitive     CEFTRIAXONE <=0.25 SENSITIVE Sensitive     CIPROFLOXACIN <=0.25 SENSITIVE Sensitive     GENTAMICIN <=1 SENSITIVE Sensitive     IMIPENEM <=0.25 SENSITIVE Sensitive     NITROFURANTOIN <=16 SENSITIVE Sensitive     TRIMETH/SULFA <=20 SENSITIVE Sensitive     AMPICILLIN/SULBACTAM <=2 SENSITIVE Sensitive     PIP/TAZO <=4 SENSITIVE Sensitive     * >=100,000 COLONIES/mL ESCHERICHIA COLI  Surgical pcr screen     Status: Abnormal   Collection Time: 02/19/20  3:51 AM   Specimen: Nasal Mucosa; Nasal Swab  Result Value Ref Range Status   MRSA, PCR POSITIVE (A) NEGATIVE Final    Comment: RESULT CALLED TO, READ BACK BY AND VERIFIED WITH: [name] RN 02/19/20 0510 JDW    Staphylococcus aureus POSITIVE (A) NEGATIVE Final    Comment: (NOTE) The Xpert SA Assay (FDA approved for NASAL specimens in patients 87 years of age and older), is one component of a comprehensive surveillance program. It is not intended to diagnose infection nor to guide or monitor treatment. Performed at [facility], [address], [place], [place] [address]       Radiology Studies: CARDIAC CATHETERIZATION  Result Date: 02/19/2020 1. Markedly elevated right and left heart filling pressures. 2. Preserved cardiac output. 3. Pulmonary venous hypertension.   [place] EKG SITE RITE  Result Date: 02/19/2020 If [place] image not attached, placement could not be confirmed due to current cardiac rhythm.    Scheduled Meds: . [MAR Hold] allopurinol  200 mg Oral Daily  . [MAR Hold] amoxicillin-clavulanate  1 tablet Oral Q12H  . [MAR  Hold] apixaban  2.5 mg Oral BID  . [MAR Hold] diclofenac Sodium  4 g Topical QID  . [MAR Hold] docusate sodium  100 mg Oral BID  . [MAR Hold] finasteride  5 mg Oral Daily  . [MAR Hold] insulin aspart  0-15 Units Subcutaneous TID WC  . [MAR Hold] insulin aspart  0-5 Units Subcutaneous QHS  . [MAR Hold] isosorbide mononitrate  30 mg Oral Daily  . [MAR Hold] levothyroxine  125 mcg Oral QAC breakfast  . [MAR Hold] magnesium oxide  400 mg Oral Daily  . [MAR Hold] mupirocin ointment  1 application Nasal BID  . [MAR Hold] rosuvastatin  10 mg Oral q1800  . [MAR Hold] sertraline  25 mg Oral Daily  . [MAR Hold] sodium chloride flush  3 mL Intravenous Q12H  . [MAR Hold] sodium chloride flush  3 mL Intravenous Q12H  . [MAR Hold] tamsulosin  0.4 mg Oral QHS  . [MAR Hold] cyanocobalamin  1,000 mcg Oral Daily   Continuous Infusions: . [MAR Hold] sodium chloride    . sodium chloride    . sodium chloride 10 mL/hr at 02/19/20 0701  . amiodarone    . furosemide (LASIX) infusion 12 mg/hr (02/19/20 1410)  . milrinone 0.125 mcg/kg/min (02/19/20 1422)     LOS: 7 days    Time spent: 25 minutes spent in the coordination of care today.    Teddy Spike, DO [facility]  If 7PM-7AM, please contact night-coverage [email or web address] 02/19/2020, 2:40 PM

## 2020-02-19 NOTE — Progress Notes (Signed)
Peripherally Inserted Central Catheter Placement  The IV Nurse has discussed with the patient and/or persons authorized to consent for the patient, the purpose of this procedure and the potential benefits and risks involved with this procedure.  The benefits include less needle sticks, lab draws from the catheter, and the patient may be discharged home with the catheter. Risks include, but not limited to, infection, bleeding, blood clot (thrombus formation), and puncture of an artery; nerve damage and irregular heartbeat and possibility to perform a PICC exchange if needed/ordered by physician.  Alternatives to this procedure were also discussed.  Bard Power PICC patient education guide, fact sheet on infection prevention and patient information card has been provided to patient /or left at bedside.    PICC Placement Documentation  PICC Double Lumen 02/19/20 PICC Right Basilic 38 cm 0 cm (Active)  Indication for Insertion or Continuance of Line Vasoactive infusions 02/19/20 1722  Exposed Catheter (cm) 0 cm 02/19/20 1722  Site Assessment Clean;Dry;Intact 02/19/20 1722  Lumen #1 Status Flushed;Saline locked;Blood return noted 02/19/20 1722  Lumen #2 Status Flushed;Saline locked;Blood return noted 02/19/20 1722  Dressing Type Transparent;Securing device 02/19/20 1722  Dressing Status Clean;Dry;Intact;Antimicrobial disc in place 02/19/20 1722  Dressing Intervention New dressing 02/19/20 1722  Dressing Change Due 02/26/20 02/19/20 1722       Annett Fabian 02/19/2020, 5:23 PM

## 2020-02-19 NOTE — Progress Notes (Signed)
PT Cancellation Note  Patient Details Name: Willford Rabideau MRN: [id number] DOB: [birth year]   Cancelled Treatment:    Reason Eval/Treat Not Completed: (P) Patient at procedure or test/unavailable Pt is off floor for heart catheterization. PT will follow back for treatment tomorrow.  Mannie Ohlin B. Beverely Risen PT, DPT Acute Rehabilitation Services Pager [phone] Office [phone]    Elon Alas [facility] 02/19/2020, 12:51 PM

## 2020-02-19 NOTE — Progress Notes (Addendum)
CCMD notified that pt had 3-beat run of VTach. Pt asymptomatic. Provider made aware. Will continue monitoring.

## 2020-02-19 NOTE — Interval H&P Note (Signed)
History and Physical Interval Note:  02/19/2020 12:44 PM  Ryan Macdonald  has presented today for surgery, with the diagnosis of heart failure.  The various methods of treatment have been discussed with the patient and family. After consideration of risks, benefits and other options for treatment, the patient has consented to  Procedure(s): RIGHT HEART CATH (N/A) as a surgical intervention.  The patient's history has been reviewed, patient examined, no change in status, stable for surgery.  I have reviewed the patient's chart and labs.  Questions were answered to the patient's satisfaction.     Lalita Ebel [employer]

## 2020-02-19 NOTE — Progress Notes (Signed)
Patient ID: Ryan Macdonald, male   DOB: 10/30/1934, 86 y.o.   MRN: [id number]     Advanced Heart Failure Rounding Note  PCP-Cardiologist: [name], MD   Subjective:    Creatinine trending down 2.86>2.7>2.23.   SBP 110s-120s.      He remains in NSR on amiodarone.   E coli UTI noted.  Ceftriaxone started, now on Augmentin.  Urine clearing.   Short of breath with ambulation:   RHC Procedural Findings (3/23): Hemodynamics (mmHg) RA mean 23 RV 74/27 PA 75/30, mean 49 PCWP mean 39 Oxygen saturations: PA 68% AO 98% Cardiac Output (Fick) 8.22  Cardiac Index (Fick) 3.84 PVR 1.2 WU    Objective:   Weight Range: 104.3 kg Body mass index is 36.01 kg/m.   Vital Signs:   Temp:  [97.7 F (36.5 C)-98.1 F (36.7 C)] 97.7 F (36.5 C) (03/23 1144) Pulse Rate:  [57-74] 68 (03/23 1310) Resp:  [15-19] 16 (03/23 1310) BP: (106-132)/(65-79) 126/73 (03/23 1310) SpO2:  [94 %-100 %] 99 % (03/23 1310) Weight:  [104.3 kg] 104.3 kg (03/23 0347) Last BM Date: 02/14/20  Weight change: Filed Weights   02/17/20 0533 02/18/20 0500 02/19/20 0347  Weight: 104.5 kg 104.5 kg 104.3 kg    Intake/Output:   Intake/Output Summary (Last 24 hours) at 02/19/2020 1326 Last data filed at 02/19/2020 0400 Gross per 24 hour  Intake 220 ml  Output 900 ml  Net -680 ml      Physical Exam    General: NAD Neck: JVP difficult but appears elevated, no thyromegaly or thyroid nodule.  Lungs: Clear to auscultation bilaterally with normal respiratory effort. CV: Nondisplaced PMI.  Heart regular S1/S2, no S3/S4, no murmur.  1-2+ edema to knees.   Abdomen: Soft, nontender, no hepatosplenomegaly, mild distention.  Skin: Intact without lesions or rashes.  Neurologic: Alert and oriented x 3.  Psych: Normal affect. Extremities: No clubbing or cyanosis.  HEENT: Normal.    Telemetry   NSR 60s (personally reviewed)  Labs    CBC Recent Labs    02/18/20 0119 02/19/20 0435  WBC 6.4 6.4    NEUTROABS 4.2 4.1  HGB 8.4* 8.5*  HCT 27.1* 27.3*  MCV 93.1 93.5  PLT 231 215   Basic Metabolic Panel Recent Labs    78/29/56 0119 02/19/20 0435  NA 135 136  K 4.6 4.5  CL 98 103  CO2 23 24  GLUCOSE 114* 118*  BUN 86* 79*  CREATININE 2.74* 2.23*  CALCIUM 8.8* 8.8*   Liver Function Tests No results for input(s): AST, ALT, ALKPHOS, BILITOT, PROT, ALBUMIN in the last 72 hours. No results for input(s): LIPASE, AMYLASE in the last 72 hours. Cardiac Enzymes No results for input(s): CKTOTAL, CKMB, CKMBINDEX, TROPONINI in the last 72 hours.  BNP: BNP (last 3 results) Recent Labs    02/11/20 1739  BNP 1,569.0*    ProBNP (last 3 results) No results for input(s): PROBNP in the last 8760 hours.   D-Dimer No results for input(s): DDIMER in the last 72 hours. Hemoglobin A1C No results for input(s): HGBA1C in the last 72 hours. Fasting Lipid Panel No results for input(s): CHOL, HDL, LDLCALC, TRIG, CHOLHDL, LDLDIRECT in the last 72 hours. Thyroid Function Tests No results for input(s): TSH, T4TOTAL, T3FREE, THYROIDAB in the last 72 hours.  Invalid input(s): FREET3  Other results:   Imaging    CARDIAC CATHETERIZATION  Result Date: 02/19/2020 1. Markedly elevated right and left heart filling pressures. 2. Preserved cardiac output. 3. Pulmonary  venous hypertension.   [place] EKG SITE RITE  Result Date: 02/19/2020 If [facility] image not attached, placement could not be confirmed due to current cardiac rhythm.    Medications:     Scheduled Medications: . [MAR Hold] allopurinol  200 mg Oral Daily  . [MAR Hold] amoxicillin-clavulanate  1 tablet Oral Q12H  . [MAR Hold] apixaban  2.5 mg Oral BID  . [MAR Hold] diclofenac Sodium  4 g Topical QID  . [MAR Hold] docusate sodium  100 mg Oral BID  . [MAR Hold] finasteride  5 mg Oral Daily  . furosemide  40 mg Intravenous Once  . [MAR Hold] insulin aspart  0-15 Units Subcutaneous TID WC  . [MAR Hold] insulin aspart  0-5 Units  Subcutaneous QHS  . [MAR Hold] isosorbide mononitrate  30 mg Oral Daily  . [MAR Hold] levothyroxine  125 mcg Oral QAC breakfast  . [MAR Hold] magnesium oxide  400 mg Oral Daily  . [MAR Hold] mupirocin ointment  1 application Nasal BID  . [MAR Hold] rosuvastatin  10 mg Oral q1800  . [MAR Hold] sertraline  25 mg Oral Daily  . [MAR Hold] sodium chloride flush  3 mL Intravenous Q12H  . [MAR Hold] sodium chloride flush  3 mL Intravenous Q12H  . [MAR Hold] tamsulosin  0.4 mg Oral QHS  . [MAR Hold] cyanocobalamin  1,000 mcg Oral Daily    Infusions: . [MAR Hold] sodium chloride    . sodium chloride    . sodium chloride 10 mL/hr at 02/19/20 0701  . amiodarone    . furosemide (LASIX) infusion    . milrinone      PRN Medications: [MAR Hold] sodium chloride, sodium chloride, [MAR Hold] acetaminophen, [MAR Hold] bisacodyl, [MAR Hold] guaiFENesin-codeine, [MAR Hold] HYDROcodone-acetaminophen, [MAR Hold] ipratropium-albuterol, [MAR Hold] methocarbamol, [MAR Hold] ondansetron (ZOFRAN) IV, [MAR Hold] sodium chloride flush, sodium chloride flush   Assessment/Plan   1. Acute on chronic systolic CHF: Primarily nonischemic cardiomyopathy. EF has been low since at least 2014.  He has single chamber Biotronik ICD. QRS not wide enough for CRT.Settings adjusted in the past to prevent RV pacing in the face of a long 1st degree AV block.  TEE in 2/21 with EF 20-25%, diffuse hypokinesis and mild LVH, mild RV systolic dysfunction. He was admitted after taking the wrong meds (no diuretic) for a number of days and developing volume overload.  He has converted to NSR on amiodarone.  Hydralazine and Coreg stopped with soft BP, SBP now 110s-120s.  Diuretics held with AKI for several days now.  Creatinine down to 2.23 today.  RHC today showed markedly elevated right and left-sided filling pressures with pulmonary venous hypertension and preserved cardiac output.  He will need significant diuresis but cardiorenal syndrome  complicates this.  - With recovering AKI and suspected RV dysfunction, I am going to start him on milrinone 0.125 mcg/kg/min to allow diuresis.  - Lasix 40 mg IV x 1 then 12 mg/hr. Will have to follow renal function carefully.   - Place PICC to follow CVP and co-ox.  - Will leave off Coreg and hydralazine/imdur for now.   2. CAD: S/p PCI 07/2018 to pLAD but CAD does not explain his cardiomyopathy.  No chest pain.   - He is off Plavix now, on Eliquis for his atrial fibrillation.  - Continue Crestor 3.Atrial fibrillation: He had been in atrial fibrillation initially this admission but now back in NSR on amiodarone.   - Eliquis held for hematuria  but later restarted.  On reduced dose with age and elevated creatinine.  - Continue amiodarone.  He has history of hypothyroidism with amiodarone in the past but is on Levoxyl.  4. AKI on CKD stage 3: Creatinine improving with higher BP and holding diuretics, but now markedly volume overloaded.  Will need to resume diuresis as above.  5. Hypothyroidism: Suspect this is due to amiodarone.  He has been on Levoxyl.  6. Anemia: Fe deficiency with FOBT+.  He denies overt bleeding.  He has had feraheme.  GI saw him and plan outpatient workup.  - Hgb stable 8.5 7. ID: E coli UTI.  - Ceftriaxone started => transitioned to Augmentin.   Loralie Champagne 02/19/2020 1:33 PM

## 2020-02-20 DIAGNOSIS — R319 Hematuria, unspecified: Secondary | ICD-10-CM

## 2020-02-20 LAB — GLUCOSE, CAPILLARY
Glucose-Capillary: 130 mg/dL — ABNORMAL HIGH (ref 70–99)
Glucose-Capillary: 172 mg/dL — ABNORMAL HIGH (ref 70–99)
Glucose-Capillary: 162 mg/dL — ABNORMAL HIGH (ref 70–99)
Glucose-Capillary: 130 mg/dL — ABNORMAL HIGH (ref 70–99)

## 2020-02-20 LAB — CBC WITH DIFFERENTIAL/PLATELET
Abs Immature Granulocytes: 0.01 10*3/uL (ref 0.00–0.07)
Basophils Absolute: 0 10*3/uL (ref 0.0–0.1)
Basophils Relative: 0 %
Eosinophils Absolute: 0.2 10*3/uL (ref 0.0–0.5)
Eosinophils Relative: 4 %
HCT: 26.6 % — ABNORMAL LOW (ref 39.0–52.0)
Hemoglobin: 8.4 g/dL — ABNORMAL LOW (ref 13.0–17.0)
Immature Granulocytes: 0 %
Lymphocytes Relative: 18 %
Lymphs Abs: 1.1 10*3/uL (ref 0.7–4.0)
MCH: 30.8 pg (ref 26.0–34.0)
MCHC: 31.6 g/dL (ref 30.0–36.0)
MCV: 97.4 fL (ref 80.0–100.0)
Monocytes Absolute: 0.8 10*3/uL (ref 0.1–1.0)
Monocytes Relative: 13 %
Neutro Abs: 4 10*3/uL (ref 1.7–7.7)
Neutrophils Relative %: 65 %
Platelets: 172 10*3/uL (ref 150–400)
RBC: 2.73 MIL/uL — ABNORMAL LOW (ref 4.22–5.81)
RDW: 18.5 % — ABNORMAL HIGH (ref 11.5–15.5)
WBC: 6.1 10*3/uL (ref 4.0–10.5)
nRBC: 0 % (ref 0.0–0.2)

## 2020-02-20 LAB — BASIC METABOLIC PANEL
Anion gap: 9 (ref 5–15)
BUN: 65 mg/dL — ABNORMAL HIGH (ref 8–23)
CO2: 28 mmol/L (ref 22–32)
Calcium: 8.8 mg/dL — ABNORMAL LOW (ref 8.9–10.3)
Chloride: 99 mmol/L (ref 98–111)
Creatinine, Ser: 1.95 mg/dL — ABNORMAL HIGH (ref 0.61–1.24)
GFR calc Af Amer: 36 mL/min — ABNORMAL LOW (ref >60)
GFR calc non Af Amer: 31 mL/min — ABNORMAL LOW (ref >60)
Glucose, Bld: 196 mg/dL — ABNORMAL HIGH (ref 70–99)
Potassium: 4.3 mmol/L (ref 3.5–5.1)
Sodium: 136 mmol/L (ref 135–145)

## 2020-02-20 LAB — MAGNESIUM: Magnesium: 2.3 mg/dL (ref 1.7–2.4)

## 2020-02-20 LAB — COOXEMETRY PANEL
Carboxyhemoglobin: 2.2 % — ABNORMAL HIGH (ref 0.5–1.5)
Methemoglobin: 1.3 % (ref 0.0–1.5)
O2 Saturation: 84.4 %
Total hemoglobin: 8.6 g/dL — ABNORMAL LOW (ref 12.0–16.0)

## 2020-02-20 MED ORDER — SENNOSIDES-DOCUSATE SODIUM 8.6-50 MG PO TABS
1.0000 | ORAL_TABLET | Freq: Two times a day (BID) | ORAL | Status: DC
  Administered 2020-02-20 – 2020-02-23 (×8): 1 via ORAL
  Filled 2020-02-20 (×9): qty 1

## 2020-02-20 MED ORDER — POLYETHYLENE GLYCOL 3350 17 G PO PACK
17.0000 g | PACK | Freq: Two times a day (BID) | ORAL | Status: DC
  Administered 2020-02-20 – 2020-02-23 (×8): 17 g via ORAL
  Filled 2020-02-20 (×9): qty 1

## 2020-02-20 NOTE — Progress Notes (Signed)
Patient ID: Ryan Macdonald, male   DOB: [birth year], 86 y.o.   MRN: [id number]     Advanced Heart Failure Rounding Note  PCP-Cardiologist: [name], MD   Subjective:    Creatinine trending down 2.86>2.7>2.23>1.95.  This morning, CVP 14.  Good UOP last night, weight down.  Continues on milrinone 0.125 and Lasix 12 mg/hr.   SBP 110s-120s.      He remains in NSR on amiodarone gtt.    E coli UTI noted.  Ceftriaxone started, now on Augmentin.  Urine clearing.   No dyspnea at rest.   RHC Procedural Findings (3/23): Hemodynamics (mmHg) RA mean 23 RV 74/27 PA 75/30, mean 49 PCWP mean 39 Oxygen saturations: PA 68% AO 98% Cardiac Output (Fick) 8.22  Cardiac Index (Fick) 3.84 PVR 1.2 WU    Objective:   Weight Range: 100.2 kg Body mass index is 34.58 kg/m.   Vital Signs:   Temp:  [97.5 F (36.4 C)-98 F (36.7 C)] 97.5 F (36.4 C) (03/24 0400) Pulse Rate:  [57-74] 62 (03/24 0743) Resp:  [15-21] 18 (03/24 0743) BP: (109-133)/(59-79) 114/64 (03/24 0743) SpO2:  [93 %-100 %] 96 % (03/24 0743) Weight:  [100.2 kg-102.3 kg] 100.2 kg (03/24 0400) Last BM Date: 02/14/20  Weight change: Filed Weights   02/19/20 0347 02/19/20 1503 02/20/20 0400  Weight: 104.3 kg 102.3 kg 100.2 kg    Intake/Output:   Intake/Output Summary (Last 24 hours) at 02/20/2020 0747 Last data filed at 02/20/2020 0600 Gross per 24 hour  Intake 685.34 ml  Output 2320 ml  Net -1634.66 ml      Physical Exam    General: NAD Neck: JVP 12 cm, no thyromegaly or thyroid nodule.  Lungs: Clear to auscultation bilaterally with normal respiratory effort. CV: Nondisplaced PMI.  Heart regular S1/S2, no S3/S4, no murmur. 1+ edema to knes.  Abdomen: Soft, nontender, no hepatosplenomegaly, no distention.  Skin: Intact without lesions or rashes.  Neurologic: Alert and oriented x 3.  Psych: Normal affect. Extremities: No clubbing or cyanosis.  HEENT: Normal.    Telemetry   NSR 60s (personally  reviewed)  Labs    CBC Recent Labs    02/19/20 0435 02/19/20 0435 02/19/20 1306 02/20/20 0409  WBC 6.4  --   --  6.1  NEUTROABS 4.1  --   --  4.0  HGB 8.5*   < > 9.9*  9.5* 8.4*  HCT 27.3*   < > 29.0*  28.0* 26.6*  MCV 93.5  --   --  97.4  PLT 215  --   --  172   < > = values in this interval not displayed.   Basic Metabolic Panel Recent Labs    09/38/18 0435 02/19/20 0435 02/19/20 1306 02/20/20 0409  NA 136   < > 140  141 136  K 4.5   < > 4.7  4.5 4.3  CL 103  --   --  99  CO2 24  --   --  28  GLUCOSE 118*  --   --  196*  BUN 79*  --   --  65*  CREATININE 2.23*  --   --  1.95*  CALCIUM 8.8*  --   --  8.8*  MG  --   --   --  2.3   < > = values in this interval not displayed.   Liver Function Tests No results for input(s): AST, ALT, ALKPHOS, BILITOT, PROT, ALBUMIN in the last 72 hours. No results for input(s):  LIPASE, AMYLASE in the last 72 hours. Cardiac Enzymes No results for input(s): CKTOTAL, CKMB, CKMBINDEX, TROPONINI in the last 72 hours.  BNP: BNP (last 3 results) Recent Labs    02/11/20 1739  BNP 1,569.0*    ProBNP (last 3 results) No results for input(s): PROBNP in the last 8760 hours.   D-Dimer No results for input(s): DDIMER in the last 72 hours. Hemoglobin A1C No results for input(s): HGBA1C in the last 72 hours. Fasting Lipid Panel No results for input(s): CHOL, HDL, LDLCALC, TRIG, CHOLHDL, LDLDIRECT in the last 72 hours. Thyroid Function Tests No results for input(s): TSH, T4TOTAL, T3FREE, THYROIDAB in the last 72 hours.  Invalid input(s): FREET3  Other results:   Imaging    CARDIAC CATHETERIZATION  Result Date: 02/19/2020 1. Markedly elevated right and left heart filling pressures. 2. Preserved cardiac output. 3. Pulmonary venous hypertension.   [place] EKG SITE RITE  Result Date: 02/19/2020 If [facility] image not attached, placement could not be confirmed due to current cardiac rhythm.    Medications:     Scheduled  Medications: . allopurinol  200 mg Oral Daily  . amoxicillin-clavulanate  1 tablet Oral Q12H  . apixaban  2.5 mg Oral BID  . Chlorhexidine Gluconate Cloth  6 each Topical Daily  . diclofenac Sodium  4 g Topical QID  . docusate sodium  100 mg Oral BID  . finasteride  5 mg Oral Daily  . insulin aspart  0-15 Units Subcutaneous TID WC  . insulin aspart  0-5 Units Subcutaneous QHS  . isosorbide mononitrate  30 mg Oral Daily  . levothyroxine  125 mcg Oral QAC breakfast  . magnesium oxide  400 mg Oral Daily  . mupirocin ointment  1 application Nasal BID  . rosuvastatin  10 mg Oral q1800  . sertraline  25 mg Oral Daily  . sodium chloride flush  3 mL Intravenous Q12H  . sodium chloride flush  3 mL Intravenous Q12H  . tamsulosin  0.4 mg Oral QHS  . cyanocobalamin  1,000 mcg Oral Daily    Infusions: . sodium chloride    . amiodarone 30 mg/hr (02/20/20 0500)  . furosemide (LASIX) infusion 12 mg/hr (02/19/20 1410)  . milrinone 0.125 mcg/kg/min (02/19/20 1422)    PRN Medications: sodium chloride, acetaminophen, bisacodyl, guaiFENesin-codeine, HYDROcodone-acetaminophen, ipratropium-albuterol, methocarbamol, ondansetron (ZOFRAN) IV, sodium chloride flush, sodium chloride flush   Assessment/Plan   1. Acute on chronic systolic CHF: Primarily nonischemic cardiomyopathy. EF has been low since at least 2014.  He has single chamber Biotronik ICD. QRS not wide enough for CRT.Settings adjusted in the past to prevent RV pacing in the face of a long 1st degree AV block.  TEE in 2/21 with EF 20-25%, diffuse hypokinesis and mild LVH, mild RV systolic dysfunction. He was admitted after taking the wrong meds (no diuretic) for a number of days and developing volume overload.  He has converted to NSR on amiodarone.  Hydralazine and Coreg stopped with soft BP, SBP now 110s-120s.  Diuretics held with AKI for several days.  Creatinine down to 1.95 today.  RHC 3/23 showed markedly elevated right and left-sided  filling pressures with pulmonary venous hypertension and preserved cardiac output.  He was started on milrinone 0.125 and Lasix gtt.  Good diuresis overnight with weight down. CVP 14, co-ox 84%.  - Continue milrinone 0.125 mcg/kg/min while diuresing.  - Lasix 12 mg/hr to continue today, hopefully can transition to po tomorrow.   - Will leave off Coreg and hydralazine/imdur  for now.   2. CAD: S/p PCI 07/2018 to pLAD but CAD does not explain his cardiomyopathy.  No chest pain.   - He is off Plavix now, on Eliquis for his atrial fibrillation.  - Continue Crestor 3.Atrial fibrillation: He had been in atrial fibrillation initially this admission but now back in NSR on amiodarone.   - Eliquis held for hematuria but later restarted.  On reduced dose with age and elevated creatinine.  - Continue amiodarone gtt while on IV milrinone, back to po amiodarone when off.  He has history of hypothyroidism with amiodarone in the past but is on Levoxyl.  4. AKI on CKD stage 3: Creatinine lower today with diuresis.  Follow carefully.   5. Hypothyroidism: Suspect this is due to amiodarone.  He has been on Levoxyl.  6. Anemia: Fe deficiency with FOBT+.  He denies overt bleeding.  He has had feraheme.  GI saw him and plan outpatient workup.  - Hgb stable 8.4 7. ID: E coli UTI.  - Ceftriaxone started => transitioned to Augmentin.   Loralie Champagne 02/20/2020 7:47 AM

## 2020-02-20 NOTE — Progress Notes (Signed)
PROGRESS NOTE    Ryan Macdonald  ANV:[id number] DOB: 14-May-1934 DOA: 02/11/2020 PCP: [facility]  Brief Narrative:  The patient is a 86 year old male with a past medical history significant for nonischemic cardiomyopathy with an EF of 20 to 25% on TEE last month, status post AICD placement, history of PAF on Eliquis who had DCCV last month as well history of PE diabetes mellitus type 2 and other comorbidities who presented with worsening swelling and lower extremity edema and increased cough and productive sputum.  Weight was up 30 pounds.  He was found to have acute on chronic systolic CHF and was in A. fib with RVR.  Currently he is being followed by the advanced heart failure team and had been treated with diuresis as well as rhythm management with amiodarone but his diuresis has been held for several days due to an AKI.  Because of his volume overload he required close monitoring and he underwent a right heart cath to assess his hemodynamics.  The right heart cath on 02/19/2020 showed markedly elevated right and left-sided filling pressures with pulmonary venous hypertension and preserved cardiac output.  Cardiology started the patient on milrinone and Lasix drip currently is being diuresed by them.  Currently is off of Plavix for CAD and currently on Eliquis for his atrial fibrillation.  Assessment & Plan:   Principal Problem:   Acute on chronic heart failure (HCC) Active Problems:   Obesity (BMI 30-39.9)   Essential hypertension   Atrial fibrillation (HCC)   Chronic renal insufficiency, stage 3 (moderate) (HCC)   Diabetes mellitus type 2 in obese (HCC)   Morbid obesity (HCC)   Constipation   Anemia   Hematuria   CKD (chronic kidney disease), stage III  Acute on chronic systolic CHF primarily nonischemic cardiomyopathy ?Cardiorenal syndrome -Continues to have some edema but this is improving -Has had a known low EF since 2014 and has a single-chamber Biotronik  ICD -Underwent a TEE in February 2021 which showed an EF of 20 to 25% with diffuse hypokinesis mild LVH as well as mild RV systolic dysfunction -Likely his symptoms developed in the setting of noncompliance after he is taken the wrong medication for a number of days; i.e. no diuretic -Presented with A. fib with RVR now is in normal sinus rhythm after IV amiodarone -Cardiology was consulted and they have held his Hydralazine and Coreg due to soft blood pressures but his blood pressures are now 110s to 120s -Initially with diuretics were also held due to AKI for several days -Patient underwent a right heart catheterization which showed markedly elevated right and left-sided filling pressures with pulmonary venous hypertension preserved cardiac output -Cardiology started the patient on a milrinone drip as well as a Lasix drip -Continues to have diuresis and patient is 8.485 L since admission -Weight is down 13 pounds since his peak on 02/11/2020 he is now 220 pounds with a dry weight of 199 -BNP on admission was 1569 -continue strict I's and O's, daily weights as well as 2 g sodium diet -Continue to monitor his respiratory status carefully as he periodically wears oxygen at home -Patient's carboxyhemoglobin was 2.2 and methemoglobin was 1.3 -Co-Ox was 84.4% -Change to monitor volume status carefully and continue with diuretics and milrinone per cardiology  Acute Kidney Injury superimposed on CKD stage IIIb, improving -Renal function initiated worsened off of diuretics but now is on a Lasix drip and his BUN/creatinine went from 86/2.74 is now 65/1.95 -Currently on a Lasix drip  now -Avoid further nephrotoxic medications, contrast dyes, hypotension and renally adjust medication -Continue to monitor and trend renal function -Repeat CMP in a.m.  Paroxysmal Atrial Fibrillation -Has had atrial fibrillation with RVR -He was started on IV amiodarone and has converted to normal sinus rhythm -Currently  remains on amiodarone drip at 30 mg/h per cardiology-continue with anticoagulation with apixaban 2.5 mg p.o. twice daily given his renal function as well as age -Continue with telemetry monitoring  Acute E. coli UTI with Hematuria -Initial urinalysis could not test for nitrites or leukocytes given that it was blood but did show many bacteria and greater than 50 RBCs per high-power field as well as greater than 50 WBCs  -Urine Cx showed pansensative E Coli -Initially was on IV Ceftriaxone and was changed to Augmentin 500 BID to complete 7 abx tx -Continue to monitor carefully  Hypothyroidism -No TSH done when he was Admitted  -Continue with Levothyroxine 125 mcg p.o. daily before breakfast  Constipation -Adjusted his bowel regimen and stopped his docusate 100 mg p.o. twice daily and change to senna docusate 1 tab p.o. twice daily along with MiraLAX 17 g p.o. twice daily and he currently also has suppositories with bisacodyl 10 mg daily RC as needed  CAD -Underwent PCI stenting and 2019 -Denies any current chest pain -Continue with isosorbide mononitrate 30 mg p.o. daily and with Rosuvastatin 10 mg p.o. daily -Early off of Plavix and continuing anticoagulation with Eliquis for his atrial fibrillation  Hyperlipidemia -Continue with Rosuvastatin 10 mg p.o. daily  BPH/history of Nephrolithiasis -Continue with Fnasteride -Continue with Tamsulosin 0.4 mg p.o. nightly  Diabetes mellitus type 2, on oral agents only. -Hemoglobin A1c 5.7. -Continue with moderate NovoLog/scale insulin before meals and at bedtime -CBGs have been relatively well controlled and ranged from 114-172; blood sugar on this a.m. his BMP is 196 -Continue to monitor and further adjust insulin as necessary  NormocyticAnemia, fecal occult blood positive -Patient is hemoglobin/hematocrit has been relatively stable last 9 days or so and today was 8.4/26.6 -Anemia panel was done and showed a iron level of 31, U IBC of  395, TIBC of 426, saturation ratios of 7%, ferritin of 34, folate of 3.8, and vitamin B12 level of 2064 -Was seen by Gastroenterology with recommendation for outpatient follow-up. -He was [facility] with [facility] 17.  -Patient will follow-up with [facility] as an outpatient.  Essential Hypertension -Antihypertensives held per cardiology. -BP was soft but Improved and this AM was 114/64 -Continue to monitor blood pressures per protocol  Obesity -Estimated body mass index is 34.58 kg/m as calculated from the following:   Height as of this encounter: 5\' 7"  (1.702 m).   Weight as of this encounter: 100.2 kg. -Weight Loss and Dietary Counseling given   DVT prophylaxis: Anticoagulated with Apixaban  Code Status: FULL CODE  Family Communication: No family present at bedside  Disposition Plan: Patient is from home and he came in significantly volume overloaded due to missed diuretics.  Will need to be adequately diuresed back to dry weight per cardiology and off of milrinone and Lasix drip prior to safe discharge.  He will also need PT OT prior to discharge given his prolonged hospitalization  Consultants:   Cardiology Advanced Heart Failure Team   Gastroenterology  Procedures:  Right Heart Catheterization 02/19/20 1. Markedly elevated right and left heart filling pressures.  2. Preserved cardiac output.  3. Pulmonary venous hypertension.    Antimicrobials:  Anti-infectives (From admission, onward)   Start  Dose/Rate Route Frequency Ordered Stop   02/18/20 0845  amoxicillin-clavulanate (AUGMENTIN) 500-125 MG per tablet 500 mg     1 tablet Oral Every 12 hours 02/18/20 0831     02/16/20 1130  cefTRIAXone (ROCEPHIN) 1 g in sodium chloride 0.9 % 100 mL IVPB  Status:  Discontinued     1 g 200 mL/hr over 30 Minutes Intravenous Every 24 hours 02/16/20 1025 02/18/20 0831     Subjective: Seen and examined at bedside and he states that he is doing good from a respiratory standpoint  denies any chest pain.  No nausea or vomiting but his main complaint was that he was constipated in the has not had a bowel movement in 2 days.  No other concerns or complaints at this time and thinks that his legs are somewhat better with the swelling.  Objective: Vitals:   02/19/20 2210 02/20/20 0000 02/20/20 0400 02/20/20 0743  BP:  109/61 (!) 113/59 114/64  Pulse:  65 62 62  Resp:  Temp:  (!) 97.5 F (36.4 C) (!) 97.5 F (36.4 C) 97.8 F (36.6 C)  TempSrc:  Oral Oral   SpO2: 96% 93% 93% 96%  Weight:   100.2 kg   Height:        Intake/Output Summary (Last 24 hours) at 02/20/2020 0804 Last data filed at 02/20/2020 0600 Gross per 24 hour  Intake 685.34 ml  Output 2320 ml  Net -1634.66 ml   Filed Weights   02/19/20 0347 02/19/20 1503 02/20/20 0400  Weight: 104.3 kg 102.3 kg 100.2 kg   Examination: Physical Exam:  Constitutional: WN/WD obese male in NAD and appears calm Eyes: Lids and conjunctivae normal, sclerae anicteric  ENMT: External Ears, Nose appear normal. Grossly normal hearing. MMM Neck: Appears normal, supple, no cervical masses, normal ROM, no appreciable thyromegaly; has some JVD Respiratory: Diminished to auscultation bilaterally with slightly coarse, no wheezing, rales, rhonchi or crackles. Normal respiratory effort and patient is not tachypenic. No accessory muscle use.  Unlabored breathing Cardiovascular: RRR, 1-2+ lower extremity edema.  No appreciable murmurs, rubs, gallops Abdomen: Soft, non-tender, distended secondary body habitus. No masses palpated. No appreciable hepatosplenomegaly. Bowel sounds positive.  GU: Deferred. Musculoskeletal: No clubbing / cyanosis of digits/nails. No joint deformity upper and lower extremities.  Skin: No rashes, lesions, ulcers on limited skin evaluation. No induration; Warm and dry.  Neurologic: CN 2-12 grossly intact with no focal deficits. Romberg sign cerebellar reflexes not assessed.  Psychiatric: Normal  judgment and insight. Alert and oriented x 3. Normal mood and appropriate affect.   Data Reviewed: I have personally reviewed following labs and imaging studies  CBC: Recent Labs  Lab 02/15/20 0356 02/15/20 0356 02/16/20 0236 02/18/20 0119 02/19/20 0435 02/19/20 1306 02/20/20 0409  WBC 7.0  --  6.4 6.4 6.4  --  6.1  NEUTROABS  --   --   --  4.2 4.1  --  4.0  HGB 8.2*   < > 8.5* 8.4* 8.5* 9.9*  9.5* 8.4*  HCT 25.9*   < > 26.7* 27.1* 27.3* 29.0*  28.0* 26.6*  MCV 90.6  --  92.1 93.1 93.5  --  97.4  PLT 239  --  247 231 215  --  172   < > = values in this interval not displayed.   Basic Metabolic Panel: Recent Labs  Lab 02/16/20 0236 02/16/20 0236 02/17/20 0503 02/18/20 0119 02/19/20 0435 02/19/20 1306 02/20/20 0409  NA 133*   < > 134*  135 136 140  141 136  K 4.7   < > 4.9 4.6 4.5 4.7  4.5 4.3  CL 98  --  98 98 103  --  99  CO2 22  --  23 23 24   --  28  GLUCOSE 118*  --  110* 114* 118*  --  196*  BUN 71*  --  78* 86* 79*  --  65*  CREATININE 2.52*  --  2.86* 2.74* 2.23*  --  1.95*  CALCIUM 9.0  --  9.1 8.8* 8.8*  --  8.8*  MG  --   --   --   --   --   --  2.3   < > = values in this interval not displayed.   GFR: Estimated Creatinine Clearance: 32.4 mL/min (A) (by C-G formula based on SCr of 1.95 mg/dL (H)). Liver Function Tests: No results for input(s): AST, ALT, ALKPHOS, BILITOT, PROT, ALBUMIN in the last 168 hours. No results for input(s): LIPASE, AMYLASE in the last 168 hours. No results for input(s): AMMONIA in the last 168 hours. Coagulation Profile: No results for input(s): INR, PROTIME in the last 168 hours. Cardiac Enzymes: No results for input(s): CKTOTAL, CKMB, CKMBINDEX, TROPONINI in the last 168 hours. BNP (last 3 results) No results for input(s): PROBNP in the last 8760 hours. HbA1C: No results for input(s): HGBA1C in the last 72 hours. CBG: Recent Labs  Lab 02/19/20 1122 02/19/20 1405 02/19/20 1603 02/19/20 2150 02/20/20 0623  GLUCAP  116* 126* 127* 145* 130*   Lipid Profile: No results for input(s): CHOL, HDL, LDLCALC, TRIG, CHOLHDL, LDLDIRECT in the last 72 hours. Thyroid Function Tests: No results for input(s): TSH, T4TOTAL, FREET4, T3FREE, THYROIDAB in the last 72 hours. Anemia Panel: No results for input(s): VITAMINB12, FOLATE, FERRITIN, TIBC, IRON, RETICCTPCT in the last 72 hours. Sepsis Labs: No results for input(s): PROCALCITON, LATICACIDVEN in the last 168 hours.  Recent Results (from the past 240 hour(s))  SARS CORONAVIRUS 2 (TAT 6-24 HRS) Nasopharyngeal Nasopharyngeal Swab     Status: None   Collection Time: 02/11/20  6:09 PM   Specimen: Nasopharyngeal Swab  Result Value Ref Range Status   SARS Coronavirus 2 NEGATIVE NEGATIVE Final    Comment: (NOTE) SARS-CoV-2 target nucleic acids are NOT DETECTED. The SARS-CoV-2 RNA is generally detectable in upper and lower respiratory specimens during the acute phase of infection. Negative results do not preclude SARS-CoV-2 infection, do not rule out co-infections with other pathogens, and should not be used as the sole basis for treatment or other patient management decisions. Negative results must be combined with clinical observations, patient history, and epidemiological information. The expected result is Negative. Fact Sheet for Patients: SugarRoll.be Fact Sheet for Healthcare Providers: https://www.woods-mathews.com/ This test is not yet approved or cleared by the [place] FDA and  has been authorized for detection and/or diagnosis of SARS-CoV-2 by FDA under an Emergency Use Authorization (EUA). This EUA will remain  in effect (meaning this test can be used) for the duration of the COVID-19 declaration under Section 56 4(b)(1) of the Act, 21 U.S.C. section 360bbb-3(b)(1), unless the authorization is terminated or revoked sooner. Performed at [facility], [place] [address] [place], [place] [address]     Culture, Urine     Status: Abnormal   Collection Time: 02/15/20 11:39 AM   Specimen: Urine, Clean Catch  Result Value Ref Range Status   Specimen Description URINE, CLEAN CATCH  Final   Special Requests  Final    NONE Performed at [facility], [address] [place], [place] [address]    Culture >=100,000 COLONIES/mL ESCHERICHIA COLI (A)  Final   Report Status 02/17/2020 FINAL  Final   Organism ID, Bacteria ESCHERICHIA COLI (A)  Final      Susceptibility   Escherichia coli - MIC*    AMPICILLIN <=2 SENSITIVE Sensitive     CEFAZOLIN <=4 SENSITIVE Sensitive     CEFTRIAXONE <=0.25 SENSITIVE Sensitive     CIPROFLOXACIN <=0.25 SENSITIVE Sensitive     GENTAMICIN <=1 SENSITIVE Sensitive     IMIPENEM <=0.25 SENSITIVE Sensitive     NITROFURANTOIN <=16 SENSITIVE Sensitive     TRIMETH/SULFA <=20 SENSITIVE Sensitive     AMPICILLIN/SULBACTAM <=2 SENSITIVE Sensitive     PIP/TAZO <=4 SENSITIVE Sensitive     * >=100,000 COLONIES/mL ESCHERICHIA COLI  Surgical pcr screen     Status: Abnormal   Collection Time: 02/19/20  3:51 AM   Specimen: Nasal Mucosa; Nasal Swab  Result Value Ref Range Status   MRSA, PCR POSITIVE (A) NEGATIVE Final    Comment: RESULT CALLED TO, READ BACK BY AND VERIFIED WITH: [name] RN 02/19/20 0510 JDW    Staphylococcus aureus POSITIVE (A) NEGATIVE Final    Comment: (NOTE) The Xpert SA Assay (FDA approved for NASAL specimens in patients 44 years of age and older), is one component of a comprehensive surveillance program. It is not intended to diagnose infection nor to guide or monitor treatment. Performed at [facility], [address], [place], [place] [address]      RN Pressure Injury Documentation:     Estimated body mass index is 34.58 kg/m as calculated from the following:   Height as of this encounter: 5\' 7"  (1.702 m).   Weight as of this encounter: 100.2 kg.  Malnutrition Type:      Malnutrition Characteristics:      Nutrition  Interventions:    Radiology Studies: CARDIAC CATHETERIZATION  Result Date: 02/19/2020 1. Markedly elevated right and left heart filling pressures. 2. Preserved cardiac output. 3. Pulmonary venous hypertension.   02/21/2020 EKG SITE RITE  Result Date: 02/19/2020 If [facility] image not attached, placement could not be confirmed due to current cardiac rhythm.  Scheduled Meds: . allopurinol  200 mg Oral Daily  . amoxicillin-clavulanate  1 tablet Oral Q12H  . apixaban  2.5 mg Oral BID  . Chlorhexidine Gluconate Cloth  6 each Topical Daily  . diclofenac Sodium  4 g Topical QID  . docusate sodium  100 mg Oral BID  . finasteride  5 mg Oral Daily  . insulin aspart  0-15 Units Subcutaneous TID WC  . insulin aspart  0-5 Units Subcutaneous QHS  . isosorbide mononitrate  30 mg Oral Daily  . levothyroxine  125 mcg Oral QAC breakfast  . magnesium oxide  400 mg Oral Daily  . mupirocin ointment  1 application Nasal BID  . rosuvastatin  10 mg Oral q1800  . sertraline  25 mg Oral Daily  . sodium chloride flush  3 mL Intravenous Q12H  . sodium chloride flush  3 mL Intravenous Q12H  . tamsulosin  0.4 mg Oral QHS  . cyanocobalamin  1,000 mcg Oral Daily   Continuous Infusions: . sodium chloride    . amiodarone 30 mg/hr (02/20/20 0500)  . furosemide (LASIX) infusion 12 mg/hr (02/19/20 1410)  . milrinone 0.125 mcg/kg/min (02/19/20 1422)    LOS: 8 days   02/21/20, DO  [facility] PAGER is on AMION  If 7PM-7AM, please contact night-coverage [email or web address]

## 2020-02-20 NOTE — Progress Notes (Signed)
PT Cancellation Note  Patient Details Name: Ryan Macdonald MRN: [id number] DOB: 01-02-1934   Cancelled Treatment:    Reason Eval/Treat Not Completed: Other (comment) Pt politely but adamantly declined therapy today. Will f/u as able. Royetta Asal, PT Acute Rehab Services Pager [phone] [facility] [phone] [facility] [phone]   Rayetta Humphrey 02/20/2020, 5:03 PM

## 2020-02-21 LAB — COOXEMETRY PANEL
Carboxyhemoglobin: 1.9 % — ABNORMAL HIGH (ref 0.5–1.5)
Methemoglobin: 0.9 % (ref 0.0–1.5)
O2 Saturation: 70.4 %
Total hemoglobin: 13.2 g/dL (ref 12.0–16.0)

## 2020-02-21 LAB — CBC WITH DIFFERENTIAL/PLATELET
Abs Immature Granulocytes: 0.03 10*3/uL (ref 0.00–0.07)
Basophils Absolute: 0 10*3/uL (ref 0.0–0.1)
Basophils Relative: 0 %
Eosinophils Absolute: 0.3 10*3/uL (ref 0.0–0.5)
Eosinophils Relative: 4 %
HCT: 27.3 % — ABNORMAL LOW (ref 39.0–52.0)
Hemoglobin: 8.3 g/dL — ABNORMAL LOW (ref 13.0–17.0)
Immature Granulocytes: 1 %
Lymphocytes Relative: 15 %
Lymphs Abs: 0.9 10*3/uL (ref 0.7–4.0)
MCH: 29.5 pg (ref 26.0–34.0)
MCHC: 30.4 g/dL (ref 30.0–36.0)
MCV: 97.2 fL (ref 80.0–100.0)
Monocytes Absolute: 0.8 10*3/uL (ref 0.1–1.0)
Monocytes Relative: 13 %
Neutro Abs: 4.1 10*3/uL (ref 1.7–7.7)
Neutrophils Relative %: 67 %
Platelets: 166 10*3/uL (ref 150–400)
RBC: 2.81 MIL/uL — ABNORMAL LOW (ref 4.22–5.81)
RDW: 18.5 % — ABNORMAL HIGH (ref 11.5–15.5)
WBC: 6.1 10*3/uL (ref 4.0–10.5)
nRBC: 0.3 % — ABNORMAL HIGH (ref 0.0–0.2)

## 2020-02-21 LAB — GLUCOSE, CAPILLARY
Glucose-Capillary: 112 mg/dL — ABNORMAL HIGH (ref 70–99)
Glucose-Capillary: 136 mg/dL — ABNORMAL HIGH (ref 70–99)
Glucose-Capillary: 134 mg/dL — ABNORMAL HIGH (ref 70–99)

## 2020-02-21 LAB — COMPREHENSIVE METABOLIC PANEL
ALT: 18 U/L (ref 0–44)
AST: 32 U/L (ref 15–41)
Albumin: 2.5 g/dL — ABNORMAL LOW (ref 3.5–5.0)
Alkaline Phosphatase: 57 U/L (ref 38–126)
Anion gap: 8 (ref 5–15)
BUN: 60 mg/dL — ABNORMAL HIGH (ref 8–23)
CO2: 31 mmol/L (ref 22–32)
Calcium: 8.6 mg/dL — ABNORMAL LOW (ref 8.9–10.3)
Chloride: 99 mmol/L (ref 98–111)
Creatinine, Ser: 1.7 mg/dL — ABNORMAL HIGH (ref 0.61–1.24)
GFR calc Af Amer: 42 mL/min — ABNORMAL LOW (ref >60)
GFR calc non Af Amer: 36 mL/min — ABNORMAL LOW (ref >60)
Glucose, Bld: 174 mg/dL — ABNORMAL HIGH (ref 70–99)
Potassium: 3.9 mmol/L (ref 3.5–5.1)
Sodium: 138 mmol/L (ref 135–145)
Total Bilirubin: 0.8 mg/dL (ref 0.3–1.2)
Total Protein: 6.9 g/dL (ref 6.5–8.1)

## 2020-02-21 LAB — PHOSPHORUS: Phosphorus: 3.1 mg/dL (ref 2.5–4.6)

## 2020-02-21 LAB — MAGNESIUM: Magnesium: 1.9 mg/dL (ref 1.7–2.4)

## 2020-02-21 MED ORDER — POTASSIUM CHLORIDE CRYS ER 20 MEQ PO TBCR
20.0000 meq | EXTENDED_RELEASE_TABLET | Freq: Once | ORAL | Status: AC
  Administered 2020-02-21: 20 meq via ORAL
  Filled 2020-02-21: qty 1

## 2020-02-21 MED ORDER — MAGNESIUM SULFATE IN D5W 1-5 GM/100ML-% IV SOLN
1.0000 g | Freq: Once | INTRAVENOUS | Status: AC
  Administered 2020-02-21: 1 g via INTRAVENOUS
  Filled 2020-02-21: qty 100

## 2020-02-21 NOTE — Progress Notes (Signed)
Physical Therapy Treatment Patient Details Name: Ryan Macdonald MRN: [id number] DOB: 08-Feb-1934 Today's Date: 02/21/2020    History of Present Illness 86 y.o. male with medical history significant of NICM EF 20-25% on TEE last month, AICD placement, PAF on eliquis had DCCV last month, h/o PE, DM2, cardiac stenting, and cardioversion 12/2019. Pt admitted to ED on 3/15 with CHF exacerbation.    PT Comments    Patient seen for mobility progression. Pt in bed upon arrival and initially hesitant to participate in therapy but agreeable with max encouragement. Pt agreeable to ambulate in room only. Pt requires assistance for bed mobility and min guard assist for gait distance of 40 ft with RW. 4L O2 required to maintain SpO2 90% or > while ambulating. Pt with c/o dizziness upon sitting but reports no dizziness in standing. Pt will continue to benefit from further skilled PT services to maximize independence and safety with mobility although pt may not be agreeable to PT follow up.     Follow Up Recommendations  Home health PT;Supervision for mobility/OOB     Equipment Recommendations  None recommended by PT    Recommendations for Other Services       Precautions / Restrictions Precautions Precautions: Fall Precaution Comments: watch O2 Restrictions Weight Bearing Restrictions: No    Mobility  Bed Mobility Overal bed mobility: Needs Assistance Bed Mobility: Supine to Sit;Sit to Supine     Supine to sit: HOB elevated;Min guard Sit to supine: Mod assist   General bed mobility comments: use of rail and HOB elevated when coming into sitting; assistance required to bring bilat LE into bed   Transfers Overall transfer level: Needs assistance Equipment used: Rolling walker (2 wheeled) Transfers: Sit to/from Stand Sit to Stand: Min guard         General transfer comment: cues for safe hand placement  Ambulation/Gait Ambulation/Gait assistance: Min guard Gait Distance (Feet): 40  Feet Assistive device: Rolling walker (2 wheeled) Gait Pattern/deviations: Step-through pattern;Decreased stride length;Trunk flexed;Shuffle Gait velocity: decr   General Gait Details: min guard for safety; decreased cadence and step height; 4L O2 via [place] required to maintain SpO2 90%> greater   Stairs             Wheelchair Mobility    Modified Rankin (Stroke Patients Only)       Balance Overall balance assessment: Needs assistance Sitting-balance support: No upper extremity supported;Feet supported Sitting balance-Leahy Scale: Good     Standing balance support: Bilateral upper extremity supported;During functional activity Standing balance-Leahy Scale: Poor                              Cognition Arousal/Alertness: Awake/alert Behavior During Therapy: WFL for tasks assessed/performed Overall Cognitive Status: Within Functional Limits for tasks assessed                                 General Comments: needs encouragement to participate; tangential at times       Exercises      General Comments        Pertinent Vitals/Pain Pain Assessment: No/denies pain    Home Living                      Prior Function            PT Goals (current goals can now be found in the care  plan section) Progress towards PT goals: Progressing toward goals    Frequency    Min 3X/week      PT Plan Current plan remains appropriate    Co-evaluation              AM-PAC PT "6 Clicks" Mobility   Outcome Measure  Help needed turning from your back to your side while in a flat bed without using bedrails?: A Little Help needed moving from lying on your back to sitting on the side of a flat bed without using bedrails?: A Little Help needed moving to and from a bed to a chair (including a wheelchair)?: A Little Help needed standing up from a chair using your arms (e.g., wheelchair or bedside chair)?: A Little Help needed to walk in  hospital room?: A Little Help needed climbing 3-5 steps with a railing? : A Lot 6 Click Score: 17    End of Session Equipment Utilized During Treatment: Gait belt Activity Tolerance: Patient tolerated treatment well Patient left: in bed;with call bell/phone within reach;with bed alarm set Nurse Communication: Mobility status PT Visit Diagnosis: Other abnormalities of gait and mobility (R26.89);Muscle weakness (generalized) (M62.81)     Time: 6659-9357 PT Time Calculation (min) (ACUTE ONLY): 45 min  Charges:  $Gait Training: 23-37 mins $Therapeutic Activity: 8-22 mins                     Erline Levine, PTA Acute Rehabilitation Services Pager: [phone] Office: [phone]     Carolynne Edouard 02/21/2020, 2:14 PM

## 2020-02-21 NOTE — Progress Notes (Signed)
Notified by CCMD that pt had 3 beats of PVCs. Pt asymptomatic. Provider notified. Will continue monitoring.

## 2020-02-21 NOTE — Progress Notes (Signed)
PROGRESS NOTE    Ryan Macdonald  JKK:[id number] DOB: [birth year] DOA: 02/11/2020 PCP: [facility]  Brief Narrative:  The patient is a 86 year old male with a past medical history significant for nonischemic cardiomyopathy with an EF of 20 to 25% on TEE last month, status post AICD placement, history of PAF on Eliquis who had DCCV last month as well history of PE diabetes mellitus type 2 and other comorbidities who presented with worsening swelling and lower extremity edema and increased cough and productive sputum.  Weight was up 30 pounds.  He was found to have acute on chronic systolic CHF and was in A. fib with RVR.  Currently he is being followed by the advanced heart failure team and had been treated with diuresis as well as rhythm management with amiodarone but his diuresis has been held for several days due to an AKI.  Because of his volume overload he required close monitoring and he underwent a right heart cath to assess his hemodynamics.  The right heart cath on 02/19/2020 showed markedly elevated right and left-sided filling pressures with pulmonary venous hypertension and preserved cardiac output.  Cardiology started the patient on milrinone and Lasix drip currently is being diuresed by them and is improving and weight is coming down and he is having good output with improving Renal Fxn.  Currently is off of Plavix for CAD and currently on Eliquis for his atrial fibrillation.  Assessment & Plan:   Principal Problem:   Acute on chronic heart failure (HCC) Active Problems:   Obesity (BMI 30-39.9)   Essential hypertension   Atrial fibrillation (HCC)   Chronic renal insufficiency, stage 3 (moderate) (HCC)   Diabetes mellitus type 2 in obese (HCC)   Morbid obesity (HCC)   Constipation   Anemia   Hematuria   CKD (chronic kidney disease), stage III  Acute on chronic systolic CHF primarily Nonischemic Cardiomyopathy ?Cardiorenal syndrome -Continues to have some edema but  this is improving somewhat  -Has had a known low EF since 2014 and has a single-chamber [employer] ICD -Underwent a TEE in February 2021 which showed an EF of 20 to 25% with diffuse hypokinesis mild LVH as well as mild RV systolic dysfunction -Likely his symptoms developed in the setting of noncompliance after he is taken the wrong medication for a number of days; i.e. no diuretic -Presented with A. fib with RVR now is in normal sinus rhythm after IV amiodarone -Cardiology was consulted and they have held his Hydralazine and Coreg due to soft blood pressures but his blood pressures are now 110s to 120s; Last BP was 125/67 -Cardiology still holding off of carvedilol as well as hydralazine/Imdur for now -Initially with diuretics were also held due to AKI for several days -Patient underwent a right heart catheterization which showed markedly elevated right and left-sided filling pressures with pulmonary venous hypertension preserved cardiac output -Cardiology started the patient on a milrinone drip as well as a Lasix drip and he remains on 0.125 mcg of Milrinone and 12 mg/hr of Lasix gtt; likely cardiology is going to diurese him 1 more day with the Lasix drip and anticipating starting torsemide 80 mg in the a.m. and 40 mg in the p.m. -Continues to have diuresis and patient is Negative 9.657 L since admission -Weight is down 15 pounds since his peak on 02/11/2020 he is now 218 pounds with a dry weight of 199 -BNP on admission was 1569 -Continue strict I's and O's, daily weights as well as 2  g sodium diet -Continue to monitor his respiratory status carefully as he periodically wears oxygen at home -Patient's carboxyhemoglobin was 1.9 and methemoglobin was 0.9 -Co-Ox was 70.4% -Change to monitor volume status carefully and continue with diuretics and milrinone per Cardiology -Likely we will need several more days of diuresis  Acute Kidney Injury superimposed on CKD stage IIIb, improving -Renal  function initiated worsened off of diuretics but now is on a Lasix drip and his BUN/creatinine went from 86/2.74 is now 60/1.70 -Currently on a Lasix drip now and was on 12 mg/hr -Avoid further nephrotoxic medications, contrast dyes, hypotension and renally adjust medication -Continue to monitor and trend renal function -Repeat CMP in a.m.  Paroxysmal Atrial Fibrillation -Has had atrial fibrillation with RVR -He was started on IV amiodarone and has converted to normal sinus rhythm -Currently remains on amiodarone drip at 30 mg/h per cardiology and per them will remain on IV amiodarone while he is on IV milrinone and will change back to p.o. amiodarone when he is off of the milrinone drip -Continue with anticoagulation with apixaban 2.5 mg p.o. twice daily given his renal function as well as age -Continue with telemetry monitoring  Acute E. coli UTI with Hematuria -Initial urinalysis could not test for nitrites or leukocytes given that it was blood but did show many bacteria and greater than 50 RBCs per high-power field as well as greater than 50 WBCs  -Urine Cx showed pansensative E Coli -Initially was on IV Ceftriaxone and was changed to Augmentin 500 BID to complete 7 abx tx -Continue to monitor carefully and Urine is clearing   Hypothyroidism -No TSH done when he was Admitted  -Continue with Levothyroxine 125 mcg p.o. daily before breakfast  Constipation -Adjusted his bowel regimen and stopped his docusate 100 mg p.o. twice daily and change to senna docusate 1 tab p.o. twice daily along with MiraLAX 17 g p.o. twice daily and he currently also has suppositories with bisacodyl 10 mg daily RC as needed -Patient has not had a bowel movement   CAD -Underwent PCI stenting and 2019 -Denies any current chest pain -Continue with isosorbide mononitrate 30 mg p.o. daily and with Rosuvastatin 10 mg p.o. daily -Early off of Plavix and continuing anticoagulation with Eliquis for his atrial  fibrillation  Hyperlipidemia -Continue with Rosuvastatin 10 mg p.o. daily  BPH/history of Nephrolithiasis -Continue with Finasteride 5 mg po Daily  -Continue with Tamsulosin 0.4 mg p.o. nightly  Diabetes mellitus type 2, on oral agents only. -Hemoglobin A1c 5.7. -Continue with moderate NovoLog/scale insulin before meals and at bedtime -CBGs have been relatively well controlled and ranged from 112-172; blood sugar on this a.m. his BMP is 174 -Continue to monitor and further adjust insulin as necessary  NormocyticAnemia, fecal occult blood positive -Patient is hemoglobin/hematocrit has been relatively stable last 9 days or so and today was 8.2/27.3 -Anemia panel was done and showed a iron level of 31, U IBC of 395, TIBC of 426, saturation ratios of 7%, ferritin of 34, folate of 3.8, and vitamin B12 level of 2064 -Was seen by Gastroenterology with recommendation for outpatient follow-up. -He was [facility] with [facility] March 17.  -Patient will follow-up with [facility] as an outpatient.  Essential Hypertension -Antihypertensives held per cardiology continue to hold his carvedilol and hydralazine/Imdur -BP was improved and was 125/67 this AM  -Continue to monitor blood pressures per protocol  Obesity -Estimated body mass index is 34.18 kg/m as calculated from the following:   Height as of  this encounter: 5\' 7"  (1.702 m).   Weight as of this encounter: 99 kg. -Weight Loss and Dietary Counseling given   DVT prophylaxis: Anticoagulated with Apixaban 2.5 mg po BID  Code Status: FULL CODE  Family Communication: No family present at bedside  Disposition Plan: Patient is from home and he came in significantly volume overloaded due to missed diuretics.  Will need to be adequately diuresed back to dry weight per cardiology and off of milrinone and Lasix drip prior to safe discharge.  He will also need PT OT prior to discharge given his prolonged hospitalization  Consultants:   Cardiology  Advanced Heart Failure Team   Gastroenterology  Procedures:  Right Heart Catheterization 02/19/20 1. Markedly elevated right and left heart filling pressures.  2. Preserved cardiac output.  3. Pulmonary venous hypertension.    Antimicrobials:  Anti-infectives (From admission, onward)   Start     Dose/Rate Route Frequency Ordered Stop   02/18/20 0845  amoxicillin-clavulanate (AUGMENTIN) 500-125 MG per tablet 500 mg     1 tablet Oral Every 12 hours 02/18/20 0831     02/16/20 1130  cefTRIAXone (ROCEPHIN) 1 g in sodium chloride 0.9 % 100 mL IVPB  Status:  Discontinued     1 g 200 mL/hr over 30 Minutes Intravenous Every 24 hours 02/16/20 1025 02/18/20 0831     Subjective: Seen and examined at bedside and he said he states he had a "rough night." States he did not sleep and has been up from 4:00 AM to 10:00. Still has not had a bowel movement yet. No CP or SOB. Feels ok. Has no Nausea and Vomiting. No other concerns or complaints at this time.    Objective: Vitals:   02/21/20 0019 02/21/20 0549 02/21/20 0600 02/21/20 0700  BP: 108/62  123/60 125/67  Pulse: 67  70 61  Resp: 20  (!) 22 (!) 24  Temp: 97.7 F (36.5 C)  98.7 F (37.1 C) 97.8 F (36.6 C)  TempSrc: Oral  Oral Oral  SpO2: 96%  91%   Weight:  99 kg    Height:        Intake/Output Summary (Last 24 hours) at 02/21/2020 0805 Last data filed at 02/21/2020 0700 Gross per 24 hour  Intake 1653.54 ml  Output 3350 ml  Net -1696.46 ml   Filed Weights   02/19/20 1503 02/20/20 0400 02/21/20 0549  Weight: 102.3 kg 100.2 kg 99 kg   Examination: Physical Exam:  Constitutional: WN/WD obese male in NAD and appears calm  Eyes: Lids and conjunctivae normal, sclerae anicteric  ENMT: External Ears, Nose appear normal. Grossly normal hearing.  Neck: Appears normal, supple, no cervical masses, normal ROM, no appreciable thyromegaly; Has some mild JVD Respiratory: Diminished to auscultation bilaterally with slightly coarse breath  sounds, no wheezing, rales, rhonchi or crackles. Normal respiratory effort and patient is not tachypenic. No accessory muscle use.  Cardiovascular: RRR; Has 1+ LE extremity edema.  Abdomen: Soft, non-tender, non-distended. No masses palpated. Bowel sounds positive.  GU: Deferred. Musculoskeletal: No clubbing / cyanosis of digits/nails. No joint deformity upper and lower extremities.  Skin: No rashes, lesions, ulcers on a limited skin evaluation. No induration; Warm and dry.  Neurologic: CN 2-12 grossly intact with no focal deficits. Romberg sign and cerebellar reflexes not assessed.  Psychiatric: Normal judgment and insight. Alert and oriented x 3. Normal mood and appropriate affect.   Data Reviewed: I have personally reviewed following labs and imaging studies  CBC: Recent Labs  Lab  02/16/20 0236 02/16/20 0236 02/18/20 0119 02/19/20 0435 02/19/20 1306 02/20/20 0409 02/21/20 0342  WBC 6.4  --  6.4 6.4  --  6.1 6.1  NEUTROABS  --   --  4.2 4.1  --  4.0 4.1  HGB 8.5*   < > 8.4* 8.5* 9.9*  9.5* 8.4* 8.3*  HCT 26.7*   < > 27.1* 27.3* 29.0*  28.0* 26.6* 27.3*  MCV 92.1  --  93.1 93.5  --  97.4 97.2  PLT 247  --  231 215  --  172 166   < > = values in this interval not displayed.   Basic Metabolic Panel: Recent Labs  Lab 02/17/20 0503 02/17/20 0503 02/18/20 0119 02/19/20 0435 02/19/20 1306 02/20/20 0409 02/21/20 0342  NA 134*   < > 135 136 140  141 136 138  K 4.9   < > 4.6 4.5 4.7  4.5 4.3 3.9  CL 98  --  98 103  --  99 99  CO2 23  --  23 24  --  28 31  GLUCOSE 110*  --  114* 118*  --  196* 174*  BUN 78*  --  86* 79*  --  65* 60*  CREATININE 2.86*  --  2.74* 2.23*  --  1.95* 1.70*  CALCIUM 9.1  --  8.8* 8.8*  --  8.8* 8.6*  MG  --   --   --   --   --  2.3 1.9  PHOS  --   --   --   --   --   --  3.1   < > = values in this interval not displayed.   GFR: Estimated Creatinine Clearance: 36.9 mL/min (A) (by C-G formula based on SCr of 1.7 mg/dL (H)). Liver Function  Tests: Recent Labs  Lab 02/21/20 0342  AST 32  ALT 18  ALKPHOS 57  BILITOT 0.8  PROT 6.9  ALBUMIN 2.5*   No results for input(s): LIPASE, AMYLASE in the last 168 hours. No results for input(s): AMMONIA in the last 168 hours. Coagulation Profile: No results for input(s): INR, PROTIME in the last 168 hours. Cardiac Enzymes: No results for input(s): CKTOTAL, CKMB, CKMBINDEX, TROPONINI in the last 168 hours. BNP (last 3 results) No results for input(s): PROBNP in the last 8760 hours. HbA1C: No results for input(s): HGBA1C in the last 72 hours. CBG: Recent Labs  Lab 02/20/20 0623 02/20/20 1206 02/20/20 1633 02/20/20 2055 02/21/20 0552  GLUCAP 130* 172* 162* 130* 112*   Lipid Profile: No results for input(s): CHOL, HDL, LDLCALC, TRIG, CHOLHDL, LDLDIRECT in the last 72 hours. Thyroid Function Tests: No results for input(s): TSH, T4TOTAL, FREET4, T3FREE, THYROIDAB in the last 72 hours. Anemia Panel: No results for input(s): VITAMINB12, FOLATE, FERRITIN, TIBC, IRON, RETICCTPCT in the last 72 hours. Sepsis Labs: No results for input(s): PROCALCITON, LATICACIDVEN in the last 168 hours.  Recent Results (from the past 240 hour(s))  SARS CORONAVIRUS 2 (TAT 6-24 HRS) Nasopharyngeal Nasopharyngeal Swab     Status: None   Collection Time: 02/11/20  6:09 PM   Specimen: Nasopharyngeal Swab  Result Value Ref Range Status   SARS Coronavirus 2 NEGATIVE NEGATIVE Final    Comment: (NOTE) SARS-CoV-2 target nucleic acids are NOT DETECTED. The SARS-CoV-2 RNA is generally detectable in upper and lower respiratory specimens during the acute phase of infection. Negative results do not preclude SARS-CoV-2 infection, do not rule out co-infections with other pathogens, and should not be used as  the sole basis for treatment or other patient management decisions. Negative results must be combined with clinical observations, patient history, and epidemiological information. The expected result is  Negative. Fact Sheet for Patients: HairSlick.no Fact Sheet for Healthcare Providers: quierodirigir.com This test is not yet approved or cleared by the [place] FDA and  has been authorized for detection and/or diagnosis of SARS-CoV-2 by FDA under an Emergency Use Authorization (EUA). This EUA will remain  in effect (meaning this test can be used) for the duration of the COVID-19 declaration under Section 56 4(b)(1) of the Act, 21 U.S.C. section 360bbb-3(b)(1), unless the authorization is terminated or revoked sooner. Performed at [facility], [address] [place], [place] [address]   Culture, Urine     Status: Abnormal   Collection Time: 02/15/20 11:39 AM   Specimen: Urine, Clean Catch  Result Value Ref Range Status   Specimen Description URINE, CLEAN CATCH  Final   Special Requests   Final    NONE Performed at [facility], [address] [place], [place] [address]    Culture >=100,000 COLONIES/mL ESCHERICHIA COLI (A)  Final   Report Status 02/17/2020 FINAL  Final   Organism ID, Bacteria ESCHERICHIA COLI (A)  Final      Susceptibility   Escherichia coli - MIC*    AMPICILLIN <=2 SENSITIVE Sensitive     CEFAZOLIN <=4 SENSITIVE Sensitive     CEFTRIAXONE <=0.25 SENSITIVE Sensitive     CIPROFLOXACIN <=0.25 SENSITIVE Sensitive     GENTAMICIN <=1 SENSITIVE Sensitive     IMIPENEM <=0.25 SENSITIVE Sensitive     NITROFURANTOIN <=16 SENSITIVE Sensitive     TRIMETH/SULFA <=20 SENSITIVE Sensitive     AMPICILLIN/SULBACTAM <=2 SENSITIVE Sensitive     PIP/TAZO <=4 SENSITIVE Sensitive     * >=100,000 COLONIES/mL ESCHERICHIA COLI  Surgical pcr screen     Status: Abnormal   Collection Time: 02/19/20  3:51 AM   Specimen: Nasal Mucosa; Nasal Swab  Result Value Ref Range Status   MRSA, PCR POSITIVE (A) NEGATIVE Final    Comment: RESULT CALLED TO, READ BACK BY AND VERIFIED WITH: [name] RN 02/19/20 0510 JDW     Staphylococcus aureus POSITIVE (A) NEGATIVE Final    Comment: (NOTE) The Xpert SA Assay (FDA approved for NASAL specimens in patients 42 years of age and older), is one component of a comprehensive surveillance program. It is not intended to diagnose infection nor to guide or monitor treatment. Performed at [facility], [address], [place], [place] [address]      RN Pressure Injury Documentation:     Estimated body mass index is 34.18 kg/m as calculated from the following:   Height as of this encounter: 5\' 7"  (1.702 m).   Weight as of this encounter: 99 kg.  Malnutrition Type:      Malnutrition Characteristics:      Nutrition Interventions:    Radiology Studies: CARDIAC CATHETERIZATION  Result Date: 02/19/2020 1. Markedly elevated right and left heart filling pressures. 2. Preserved cardiac output. 3. Pulmonary venous hypertension.   02/21/2020 EKG SITE RITE  Result Date: 02/19/2020 If [facility] image not attached, placement could not be confirmed due to current cardiac rhythm.  Scheduled Meds: . allopurinol  200 mg Oral Daily  . amoxicillin-clavulanate  1 tablet Oral Q12H  . apixaban  2.5 mg Oral BID  . Chlorhexidine Gluconate Cloth  6 each Topical Daily  . diclofenac Sodium  4 g Topical QID  .  finasteride  5 mg Oral Daily  . insulin aspart  0-15 Units Subcutaneous TID WC  . insulin aspart  0-5 Units Subcutaneous QHS  . isosorbide mononitrate  30 mg Oral Daily  . levothyroxine  125 mcg Oral QAC breakfast  . magnesium oxide  400 mg Oral Daily  . mupirocin ointment  1 application Nasal BID  . polyethylene glycol  17 g Oral BID  . rosuvastatin  10 mg Oral q1800  . senna-docusate  1 tablet Oral BID  . sertraline  25 mg Oral Daily  . sodium chloride flush  3 mL Intravenous Q12H  . sodium chloride flush  3 mL Intravenous Q12H  . tamsulosin  0.4 mg Oral QHS  . cyanocobalamin  1,000 mcg Oral Daily   Continuous Infusions: . sodium chloride    . amiodarone  30 mg/hr (02/21/20 0218)  . furosemide (LASIX) infusion 12 mg/hr (02/21/20 0649)  . milrinone 0.125 mcg/kg/min (02/19/20 1422)    LOS: 9 days   Merlene Laughter, DO [facility] PAGER is on AMION  If 7PM-7AM, please contact night-coverage [email or web address]

## 2020-02-21 NOTE — Progress Notes (Addendum)
Patient ID: Ryan Macdonald, male   DOB: Jun 11, 1934, 86 y.o.   MRN: [id number]     Advanced Heart Failure Rounding Note  PCP-Cardiologist: Skeet Latch, MD   Subjective:    Creatinine trending down 2.86>2.7>2.23>1.95>1.7   Remains on milrinone 0.125 mcg + lasix drip 12 mg per hour. Negative 2 liters. Weight down another 2 pounds. Overall weight down 15 pounds.  E coli UTI noted.  Ceftriaxone started, now on Augmentin.  Urine clear.  Denies SOB. Denies chest pain.    [facility] Procedural Findings (3/23): Hemodynamics (mmHg) RA mean 23 RV 74/27 PA 75/30, mean 49 PCWP mean 39 Oxygen saturations: PA 68% AO 98% Cardiac Output (Fick) 8.22  Cardiac Index (Fick) 3.84 PVR 1.2 WU    Objective:   Weight Range: 99 kg Body mass index is 34.18 kg/m.   Vital Signs:   Temp:  [97.6 F (36.4 C)-98.7 F (37.1 C)] 97.8 F (36.6 C) (03/25 0700) Pulse Rate:  [61-82] 61 (03/25 0700) Resp:  [16-24] 24 (03/25 0700) BP: (98-127)/(57-67) 125/67 (03/25 0700) SpO2:  [91 %-98 %] 91 % (03/25 0600) Weight:  [99 kg] 99 kg (03/25 0549) Last BM Date: 02/14/20  Weight change: Filed Weights   02/19/20 1503 02/20/20 0400 02/21/20 0549  Weight: 102.3 kg 100.2 kg 99 kg    Intake/Output:   Intake/Output Summary (Last 24 hours) at 02/21/2020 0733 Last data filed at 02/21/2020 0700 Gross per 24 hour  Intake 1653.54 ml  Output 3750 ml  Net -2096.46 ml      Physical Exam   CVP 10-11  General:  Well appearing. No resp difficulty HEENT: normal Neck: supple. no JVD. Carotids 2+ bilat; no bruits. No lymphadenopathy or thryomegaly appreciated. Cor: PMI nondisplaced. Regular rate & rhythm. No rubs, gallops or murmurs. Lungs: clear Abdomen: soft, nontender, nondistended. No hepatosplenomegaly. No bruits or masses. Good bowel sounds. Extremities: no cyanosis, clubbing, rash, R and LLE unna boots  Neuro: alert & orientedx3, cranial nerves grossly intact. moves all 4 extremities w/o difficulty. Affect  pleasant   Telemetry   NSR 60s personally reviewed.   Labs    CBC Recent Labs    02/20/20 0409 02/21/20 0342  WBC 6.1 6.1  NEUTROABS 4.0 4.1  HGB 8.4* 8.3*  HCT 26.6* 27.3*  MCV 97.4 97.2  PLT 172 329   Basic Metabolic Panel Recent Labs    02/20/20 0409 02/21/20 0342  NA 136 138  K 4.3 3.9  CL 99 99  CO2 28 31  GLUCOSE 196* 174*  BUN 65* 60*  CREATININE 1.95* 1.70*  CALCIUM 8.8* 8.6*  MG 2.3 1.9  PHOS  --  3.1   Liver Function Tests Recent Labs    02/21/20 0342  AST 32  ALT 18  ALKPHOS 57  BILITOT 0.8  PROT 6.9  ALBUMIN 2.5*   No results for input(s): LIPASE, AMYLASE in the last 72 hours. Cardiac Enzymes No results for input(s): CKTOTAL, CKMB, CKMBINDEX, TROPONINI in the last 72 hours.  BNP: BNP (last 3 results) Recent Labs    02/11/20 1739  BNP 1,569.0*    ProBNP (last 3 results) No results for input(s): PROBNP in the last 8760 hours.   D-Dimer No results for input(s): DDIMER in the last 72 hours. Hemoglobin A1C No results for input(s): HGBA1C in the last 72 hours. Fasting Lipid Panel No results for input(s): CHOL, HDL, LDLCALC, TRIG, CHOLHDL, LDLDIRECT in the last 72 hours. Thyroid Function Tests No results for input(s): TSH, T4TOTAL, T3FREE, THYROIDAB in the  last 72 hours.  Invalid input(s): FREET3  Other results:   Imaging    No results found.   Medications:     Scheduled Medications: . allopurinol  200 mg Oral Daily  . amoxicillin-clavulanate  1 tablet Oral Q12H  . apixaban  2.5 mg Oral BID  . Chlorhexidine Gluconate Cloth  6 each Topical Daily  . diclofenac Sodium  4 g Topical QID  . finasteride  5 mg Oral Daily  . insulin aspart  0-15 Units Subcutaneous TID WC  . insulin aspart  0-5 Units Subcutaneous QHS  . isosorbide mononitrate  30 mg Oral Daily  . levothyroxine  125 mcg Oral QAC breakfast  . magnesium oxide  400 mg Oral Daily  . mupirocin ointment  1 application Nasal BID  . polyethylene glycol  17 g Oral  BID  . rosuvastatin  10 mg Oral q1800  . senna-docusate  1 tablet Oral BID  . sertraline  25 mg Oral Daily  . sodium chloride flush  3 mL Intravenous Q12H  . sodium chloride flush  3 mL Intravenous Q12H  . tamsulosin  0.4 mg Oral QHS  . cyanocobalamin  1,000 mcg Oral Daily    Infusions: . sodium chloride    . amiodarone 30 mg/hr (02/21/20 0218)  . furosemide (LASIX) infusion 12 mg/hr (02/21/20 0649)  . milrinone 0.125 mcg/kg/min (02/19/20 1422)    PRN Medications: sodium chloride, acetaminophen, bisacodyl, guaiFENesin-codeine, HYDROcodone-acetaminophen, ipratropium-albuterol, methocarbamol, ondansetron (ZOFRAN) IV, sodium chloride flush, sodium chloride flush   Assessment/Plan   1. Acute on chronic systolic CHF: Primarily nonischemic cardiomyopathy. EF has been low since at least 2014.  He has single chamber Biotronik ICD. QRS not wide enough for CRT.Settings adjusted in the past to prevent RV pacing in the face of a long 1st degree AV block.  TEE in 2/21 with EF 20-25%, diffuse hypokinesis and mild LVH, mild RV systolic dysfunction. He was admitted after taking the wrong meds (no diuretic) for a number of days and developing volume overload.  He has converted to NSR on amiodarone.  Hydralazine and Coreg stopped with soft BP, SBP now 110s-120s.  Diuretics held with AKI for several days.  Creatinine down to 1.95 today.  RHC 3/23 showed markedly elevated right and left-sided filling pressures with pulmonary venous hypertension and preserved cardiac output.  He was started on milrinone 0.125 and Lasix gtt.   CVP down to 10-11. Continue lasix drip + milrinone once more day.  - Anticipate starting torsemide 80 mg in am and 40 mg in pm.  - Continue milrinone 0.125 mcg/kg/min while diuresing.  - Will leave off Coreg and hydralazine/imdur for now.   2. CAD: S/p PCI 07/2018 to pLAD but CAD does not explain his cardiomyopathy.   - No chest pain.    - He is off Plavix now, on Eliquis for his  atrial fibrillation.  - Continue Crestor 3.Atrial fibrillation: He had been in atrial fibrillation initially this admission but now back in NSR on amiodarone.   - Eliquis held for hematuria but later restarted.  On reduced dose with age and elevated creatinine.  - Continue amiodarone gtt while on IV milrinone, back to po amiodarone when off.  He has history of hypothyroidism with amiodarone in the past but is on Levoxyl.  4. AKI on CKD stage 3: Creatinine trending down.    5. Hypothyroidism: Suspect this is due to amiodarone.  He has been on Levoxyl.  6. Anemia: Fe deficiency with FOBT+.  He denies overt  bleeding.  He has had feraheme.  GI saw him and plan outpatient workup.  - Hgb stable 8.3  7. ID: E coli UTI.  - Ceftriaxone started => transitioned to Augmentin.   Amy Clegg NP-C  02/21/2020 7:33 AM   Patient seen with NP, agree with the above note.   He is feeling better, creatinine is trending down (1.7).  He is diuresing well and CVP is down to 10.  Co-ox 70%.    Still some volume overload, will continue milrinone 0.125 + Lasix gtt at 12 mg/hr today, hopefully to po diuretic and off milrinone tomorrow.   Continue amiodarone IV while on milrinone, likely back to po tomorrow.  Remains in NSR.   [name] 02/21/2020

## 2020-02-22 ENCOUNTER — Inpatient Hospital Stay (HOSPITAL_COMMUNITY): Payer: No Typology Code available for payment source

## 2020-02-22 DIAGNOSIS — J9601 Acute respiratory failure with hypoxia: Secondary | ICD-10-CM

## 2020-02-22 LAB — CBC WITH DIFFERENTIAL/PLATELET
Abs Immature Granulocytes: 0.02 10*3/uL (ref 0.00–0.07)
Basophils Absolute: 0 10*3/uL (ref 0.0–0.1)
Basophils Relative: 0 %
Eosinophils Absolute: 0.3 10*3/uL (ref 0.0–0.5)
Eosinophils Relative: 5 %
HCT: 27.6 % — ABNORMAL LOW (ref 39.0–52.0)
Hemoglobin: 8.5 g/dL — ABNORMAL LOW (ref 13.0–17.0)
Immature Granulocytes: 0 %
Lymphocytes Relative: 16 %
Lymphs Abs: 1 10*3/uL (ref 0.7–4.0)
MCH: 29.8 pg (ref 26.0–34.0)
MCHC: 30.8 g/dL (ref 30.0–36.0)
MCV: 96.8 fL (ref 80.0–100.0)
Monocytes Absolute: 0.9 10*3/uL (ref 0.1–1.0)
Monocytes Relative: 14 %
Neutro Abs: 4.1 10*3/uL (ref 1.7–7.7)
Neutrophils Relative %: 65 %
Platelets: 158 10*3/uL (ref 150–400)
RBC: 2.85 MIL/uL — ABNORMAL LOW (ref 4.22–5.81)
RDW: 18.8 % — ABNORMAL HIGH (ref 11.5–15.5)
WBC: 6.3 10*3/uL (ref 4.0–10.5)
nRBC: 0 % (ref 0.0–0.2)

## 2020-02-22 LAB — GLUCOSE, CAPILLARY
Glucose-Capillary: 153 mg/dL — ABNORMAL HIGH (ref 70–99)
Glucose-Capillary: 113 mg/dL — ABNORMAL HIGH (ref 70–99)
Glucose-Capillary: 171 mg/dL — ABNORMAL HIGH (ref 70–99)
Glucose-Capillary: 132 mg/dL — ABNORMAL HIGH (ref 70–99)
Glucose-Capillary: 142 mg/dL — ABNORMAL HIGH (ref 70–99)

## 2020-02-22 LAB — COMPREHENSIVE METABOLIC PANEL
ALT: 18 U/L (ref 0–44)
AST: 34 U/L (ref 15–41)
Albumin: 2.4 g/dL — ABNORMAL LOW (ref 3.5–5.0)
Alkaline Phosphatase: 54 U/L (ref 38–126)
Anion gap: 13 (ref 5–15)
BUN: 49 mg/dL — ABNORMAL HIGH (ref 8–23)
CO2: 33 mmol/L — ABNORMAL HIGH (ref 22–32)
Calcium: 8.8 mg/dL — ABNORMAL LOW (ref 8.9–10.3)
Chloride: 92 mmol/L — ABNORMAL LOW (ref 98–111)
Creatinine, Ser: 1.54 mg/dL — ABNORMAL HIGH (ref 0.61–1.24)
GFR calc Af Amer: 48 mL/min — ABNORMAL LOW (ref >60)
GFR calc non Af Amer: 41 mL/min — ABNORMAL LOW (ref >60)
Glucose, Bld: 188 mg/dL — ABNORMAL HIGH (ref 70–99)
Potassium: 4.2 mmol/L (ref 3.5–5.1)
Sodium: 138 mmol/L (ref 135–145)
Total Bilirubin: 0.9 mg/dL (ref 0.3–1.2)
Total Protein: 6.9 g/dL (ref 6.5–8.1)

## 2020-02-22 LAB — COOXEMETRY PANEL
Carboxyhemoglobin: 2 % — ABNORMAL HIGH (ref 0.5–1.5)
Methemoglobin: 1.3 % (ref 0.0–1.5)
O2 Saturation: 71.3 %
Total hemoglobin: 9.7 g/dL — ABNORMAL LOW (ref 12.0–16.0)

## 2020-02-22 LAB — PHOSPHORUS: Phosphorus: 2.7 mg/dL (ref 2.5–4.6)

## 2020-02-22 LAB — MAGNESIUM: Magnesium: 1.9 mg/dL (ref 1.7–2.4)

## 2020-02-22 MED ORDER — HYDRALAZINE HCL 25 MG PO TABS
12.5000 mg | ORAL_TABLET | Freq: Three times a day (TID) | ORAL | Status: DC
  Administered 2020-02-22 – 2020-02-24 (×7): 12.5 mg via ORAL
  Filled 2020-02-22 (×7): qty 1

## 2020-02-22 MED ORDER — AMIODARONE HCL 200 MG PO TABS
200.0000 mg | ORAL_TABLET | Freq: Two times a day (BID) | ORAL | Status: DC
  Administered 2020-02-22 – 2020-02-24 (×5): 200 mg via ORAL
  Filled 2020-02-22 (×5): qty 1

## 2020-02-22 MED ORDER — TORSEMIDE 20 MG PO TABS
80.0000 mg | ORAL_TABLET | Freq: Every day | ORAL | Status: DC
  Administered 2020-02-23 – 2020-02-24 (×2): 80 mg via ORAL
  Filled 2020-02-22 (×2): qty 4

## 2020-02-22 MED ORDER — TORSEMIDE 20 MG PO TABS
40.0000 mg | ORAL_TABLET | Freq: Every day | ORAL | Status: DC
  Administered 2020-02-22 – 2020-02-23 (×2): 40 mg via ORAL
  Filled 2020-02-22 (×2): qty 2

## 2020-02-22 NOTE — Progress Notes (Addendum)
Patient ID: Ryan Macdonald, male   DOB: 1934/11/29, 86 y.o.   MRN: [id number]     Advanced Heart Failure Rounding Note  PCP-Cardiologist: [name], MD   Subjective:    Creatinine trending down 2.86>2.7>2.23>1.95>1.7 >1.5   Remains on milrinone 0.125 mcg + lasix drip 12 mg per hour. CVP 9. CO-OX 71%.    E coli UTI noted.  Ceftriaxone started, now on Augmentin.  Urine clear.  Tired today. Denies SOB. Marland Kitchen    [facility] Procedural Findings (3/23): Hemodynamics (mmHg) RA mean 23 RV 74/27 PA 75/30, mean 49 PCWP mean 39 Oxygen saturations: PA 68% AO 98% Cardiac Output (Fick) 8.22  Cardiac Index (Fick) 3.84 PVR 1.2 WU    Objective:   Weight Range: 99.4 kg Body mass index is 34.32 kg/m.   Vital Signs:   Temp:  [97.7 F (36.5 C)-98.7 F (37.1 C)] 98.2 F (36.8 C) (03/26 0735) Pulse Rate:  [67-75] 75 (03/26 0800) Resp:  [15-21] 19 (03/26 0800) BP: (110-120)/(62-66) 119/65 (03/26 0800) SpO2:  [95 %-100 %] 97 % (03/26 0800) Weight:  [99.4 kg] 99.4 kg (03/26 0500) Last BM Date: 02/20/20  Weight change: Filed Weights   02/20/20 0400 02/21/20 0549 02/22/20 0500  Weight: 100.2 kg 99 kg 99.4 kg    Intake/Output:   Intake/Output Summary (Last 24 hours) at 02/22/2020 1047 Last data filed at 02/22/2020 1020 Gross per 24 hour  Intake 2052.06 ml  Output 2735 ml  Net -682.94 ml      Physical Exam   CVP 9  General:  No resp difficulty HEENT: normal Neck: supple. JVP 9-10 . Carotids 2+ bilat; no bruits. No lymphadenopathy or thryomegaly appreciated. Cor: PMI nondisplaced. Regular rate & rhythm. No rubs, gallops or murmurs. Lungs: clear Abdomen: soft, nontender, nondistended. No hepatosplenomegaly. No bruits or masses. Good bowel sounds. Extremities: no cyanosis, clubbing, rash, R and LLE unna boots. RUE PICC   Telemetry   NSR 60s personally reviewed.   Labs    CBC Recent Labs    02/21/20 0342 02/22/20 0524  WBC 6.1 6.3  NEUTROABS 4.1 4.1  HGB 8.3* 8.5*    HCT 27.3* 27.6*  MCV 97.2 96.8  PLT 166 158   Basic Metabolic Panel Recent Labs    63/01/60 0342 02/22/20 0524  NA 138 138  K 3.9 4.2  CL 99 92*  CO2 31 33*  GLUCOSE 174* 188*  BUN 60* 49*  CREATININE 1.70* 1.54*  CALCIUM 8.6* 8.8*  MG 1.9 1.9  PHOS 3.1 2.7   Liver Function Tests Recent Labs    02/21/20 0342 02/22/20 0524  AST 32 34  ALT 18 18  ALKPHOS 57 54  BILITOT 0.8 0.9  PROT 6.9 6.9  ALBUMIN 2.5* 2.4*   No results for input(s): LIPASE, AMYLASE in the last 72 hours. Cardiac Enzymes No results for input(s): CKTOTAL, CKMB, CKMBINDEX, TROPONINI in the last 72 hours.  BNP: BNP (last 3 results) Recent Labs    02/11/20 1739  BNP 1,569.0*    ProBNP (last 3 results) No results for input(s): PROBNP in the last 8760 hours.   D-Dimer No results for input(s): DDIMER in the last 72 hours. Hemoglobin A1C No results for input(s): HGBA1C in the last 72 hours. Fasting Lipid Panel No results for input(s): CHOL, HDL, LDLCALC, TRIG, CHOLHDL, LDLDIRECT in the last 72 hours. Thyroid Function Tests No results for input(s): TSH, T4TOTAL, T3FREE, THYROIDAB in the last 72 hours.  Invalid input(s): FREET3  Other results:   Imaging  DG CHEST PORT 1 VIEW  Result Date: 02/22/2020 CLINICAL DATA:  Shortness of breath. Fluid overload. History of CHF. EXAM: PORTABLE CHEST 1 VIEW COMPARISON:  02/21/2020. FINDINGS: Right PICC line noted with tip over cavoatrial junction. Cardiac pacer in stable position. Stable cardiomegaly. Pulmonary venous congestion. Mild bilateral interstitial prominence suggesting CHF noted on today's exam. Bibasilar atelectasis/infiltrates. Moderate right-sided pleural effusion, increased from prior exam. No pneumothorax. IMPRESSION: 1.  Right PICC line noted with tip over SVC. 2. Cardiac pacer stable position. Stable cardiomegaly. Pulmonary venous congestion. Mild bilateral interstitial prominence suggesting CHF noted on today's exam. Moderate  right-sided pleural effusion, increased from prior exam. 3.  Bibasilar atelectasis/infiltrates. Electronically Signed   By: Marcello Moores  Register   On: 02/22/2020 06:25     Medications:     Scheduled Medications: . allopurinol  200 mg Oral Daily  . amoxicillin-clavulanate  1 tablet Oral Q12H  . apixaban  2.5 mg Oral BID  . Chlorhexidine Gluconate Cloth  6 each Topical Daily  . diclofenac Sodium  4 g Topical QID  . finasteride  5 mg Oral Daily  . insulin aspart  0-15 Units Subcutaneous TID WC  . insulin aspart  0-5 Units Subcutaneous QHS  . isosorbide mononitrate  30 mg Oral Daily  . levothyroxine  125 mcg Oral QAC breakfast  . magnesium oxide  400 mg Oral Daily  . mupirocin ointment  1 application Nasal BID  . polyethylene glycol  17 g Oral BID  . rosuvastatin  10 mg Oral q1800  . senna-docusate  1 tablet Oral BID  . sertraline  25 mg Oral Daily  . sodium chloride flush  3 mL Intravenous Q12H  . sodium chloride flush  3 mL Intravenous Q12H  . tamsulosin  0.4 mg Oral QHS  . cyanocobalamin  1,000 mcg Oral Daily    Infusions: . sodium chloride    . amiodarone 30 mg/hr (02/22/20 0417)  . furosemide (LASIX) infusion 12 mg/hr (02/22/20 0621)  . milrinone 0.125 mcg/kg/min (02/21/20 1240)    PRN Medications: sodium chloride, acetaminophen, bisacodyl, guaiFENesin-codeine, HYDROcodone-acetaminophen, ipratropium-albuterol, methocarbamol, ondansetron (ZOFRAN) IV, sodium chloride flush, sodium chloride flush   Assessment/Plan   1. Acute on chronic systolic CHF: Primarily nonischemic cardiomyopathy. EF has been low since at least 2014.  He has single chamber Biotronik ICD. QRS not wide enough for CRT.Settings adjusted in the past to prevent RV pacing in the face of a long 1st degree AV block.  TEE in 2/21 with EF 20-25%, diffuse hypokinesis and mild LVH, mild RV systolic dysfunction. He was admitted after taking the wrong meds (no diuretic) for a number of days and developing volume  overload.  He has converted to NSR on amiodarone.  Hydralazine and Coreg stopped with soft BP, SBP now 110s-120s.  Diuretics held with AKI for several days.  Creatinine down to 1.95 today.  [place] 3/23 showed markedly elevated right and left-sided filling pressures with pulmonary venous hypertension and preserved cardiac output.  He was started on milrinone 0.125 and Lasix gtt.   CVP down to 9. Stop lasix drip and start torsemide 80 mg in am and 40 mg in pm. (home dose torsemide 60 daily)  - Stop milrinone.  - Will leave off Coreg for now.  - Add hydralazine 12.5 mg three times a day and continue imdur 30 mg daily  - Tomorrow consider spiro - Renal function stable.  2. CAD: S/p PCI 07/2018 to pLAD but CAD does not explain his cardiomyopathy.   - No chest  pain.  - He is off Plavix now, on Eliquis for his atrial fibrillation.  - Continue Crestor 3.Atrial fibrillation: He had been in atrial fibrillation initially this admission but now back in NSR on amiodarone.   - Eliquis held for hematuria but later restarted.  On reduced dose with age and elevated creatinine.  - Stop amio drip. Start amio 200 mg po twice a day.  - He has history of hypothyroidism with amiodarone in the past but is on Levoxyl.  4. AKI on CKD stage 3: Creatinine down to 1.5     5. Hypothyroidism: Suspect this is due to amiodarone.  He has been on Levoxyl.  6. Anemia: Fe deficiency with FOBT+.  He denies overt bleeding.  He has had feraheme.  GI saw him and plan outpatient workup.  - Hgb stable 8.5   7. ID: E coli UTI.  - Ceftriaxone started => transitioned to Augmentin.   Amy Clegg NP-C  02/22/2020 10:47 AM   Patient seen with NP, agree with the above note.   Doing well today, CVP 9 with co-ox 71%.  Creatinine down to 1.5.  Breathing better.    Stop milrinone today and stop Lasix gtt.  Start torsemide 80 qam/40 qpm.  Add hydralazine 12.5 mg tid and Imdur 30 daily.  Low dose spironolactone tomorrow.   He remains in NSR,  continue amiodarone 200 mg bid x 10 days then 200 mg daily.  Continue apixaban.   Hopefully home Saturday or Sunday.   [name] 02/22/2020 1:39 PM

## 2020-02-22 NOTE — Plan of Care (Signed)

## 2020-02-22 NOTE — Progress Notes (Signed)
Orthopedic Tech Progress Note Patient Details:  Ryan Macdonald Nov 17, 1934 [id number]  Ortho Devices Type of Ortho Device: Radio broadcast assistant Ortho Device/Splint Location: bilateral unna boots Ortho Device/Splint Interventions: Application   Post Interventions Patient Tolerated: Well Instructions Provided: Care of device   Saul Fordyce 02/22/2020, 7:40 AM

## 2020-02-22 NOTE — Progress Notes (Signed)
PROGRESS NOTE    Ryan Macdonald  HUD:[id number] DOB: 1934-02-17 DOA: 02/11/2020 PCP: [facility]  Brief Narrative:  The patient is a 86 year old male with a past medical history significant for nonischemic cardiomyopathy with an EF of 20 to 25% on TEE last month, status post AICD placement, history of PAF on Eliquis who had DCCV last month as well history of PE diabetes mellitus type 2 and other comorbidities who presented with worsening swelling and lower extremity edema and increased cough and productive sputum.  Weight was up 30 pounds.  He was found to have acute on chronic systolic CHF and was in A. fib with RVR.  Currently he is being followed by the advanced heart failure team and had been treated with diuresis as well as rhythm management with amiodarone but his diuresis has been held for several days due to an AKI.  Because of his volume overload he required close monitoring and he underwent a right heart cath to assess his hemodynamics.  The right heart cath on 02/19/2020 showed markedly elevated right and left-sided filling pressures with pulmonary venous hypertension and preserved cardiac output.  Cardiology started the patient on milrinone and Lasix drip currently is being diuresed by them and is improving and weight is coming down and he is having good output with improving Renal Fxn.  Cardiology is planning on stopping his Milrinone and changing his Lasix drip to Torsemide 80 mg in a.m. and 40 mg in the p.m. currently is off of Plavix for CAD and currently on Eliquis for his atrial fibrillation.  Assessment & Plan:   Principal Problem:   Acute on chronic heart failure (HCC) Active Problems:   Obesity (BMI 30-39.9)   Essential hypertension   Atrial fibrillation (HCC)   Chronic renal insufficiency, stage 3 (moderate) (HCC)   Diabetes mellitus type 2 in obese (HCC)   Morbid obesity (HCC)   Constipation   Anemia   Hematuria   CKD (chronic kidney disease), stage III   Acute on chronic systolic CHF primarily Nonischemic Cardiomyopathy ?Cardiorenal syndrome Acute respiratory failure with hypoxia in the setting of above -Continues to have some edema but this is improving somewhat  -Has had a known low EF since 2014 and has a single-chamber [employer] ICD -Underwent a TEE in February 2021 which showed an EF of 20 to 25% with diffuse hypokinesis mild LVH as well as mild RV systolic dysfunction -Likely his symptoms developed in the setting of noncompliance after he is taken the wrong medication for a number of days; i.e. no diuretic -Presented with A. fib with RVR now is in normal sinus rhythm after IV amiodarone -Cardiology was consulted and they have held his Hydralazine and Coreg due to soft blood pressures but his blood pressures are now 110s to 120s; Last BP was 115/62 -Cardiology still holding off of Carvedilol.he is started Imdur 30 mg p.o. daily and also added hydralazine 12.5 mg 3 times daily -Considering adding spironolactone tomorrow -Initially with diuretics were also held due to AKI for several days -Patient underwent a right heart catheterization which showed markedly elevated right and left-sided filling pressures with pulmonary venous hypertension preserved cardiac output -Cardiology started the patient on a milrinone drip as well as a Lasix drip and he remains on 0.125 mcg of Milrinone and 12 mg/hr of Lasix gtt; Cardiology is to stop the Lasix drip and milrinone drip and Start Torsemide 80 mg in the a.m. and 40 mg in the p.m. -Continues to have diuresis and patient  is Negative 9.710 L since admission -Weight is down 14pounds since his peak on 02/11/2020 he is now 219 pounds with a dry weight of 199 -BNP on admission was 1569 -Continue strict I's and O's, daily weights as well as 2 g sodium diet -Continue to monitor his respiratory status carefully as he periodically wears oxygen at home -Patient's carboxyhemoglobin was 2.0 and methemoglobin was 1.3  -Co-Ox was 71.3% -SpO2: 97 % O2 Flow Rate (L/min): 3 L/min  -Continuous pulse oximetry and maintain O2 saturations greater than 92% next-continue supplemental oxygen via nasal cannula and wean O2 as tolerated - -Change to monitor volume status carefully and continue with diuretics and milrinone per Cardiology -Repeat CXR on 02/22/20 showed "Right PICC line noted with tip over cavoatrial junction. Cardiac pacer in stable position. Stable cardiomegaly. Pulmonary venous congestion. Mild bilateral interstitial prominence suggesting CHF noted on today's exam. Bibasilar atelectasis/infiltrates. Moderate right-sided pleural effusion, increased from prior exam. No Pneumothorax."  -Patient will need an ambulatory home O2 screen prior to discharge -Patient's diuresis has been changed to p.o Torsemide. by the Cardiology team today.  Acute Kidney Injury superimposed on CKD stage IIIb, improving Metabolic Alkalosis -Renal function initiated worsened off of diuretics but now is on a Lasix drip and his BUN/creatinine went from 86/2.74 is now 49/1.54 -Was on a Lasix drip and being changed to po Torsemide  -Likely is metabolic alkalosis with a CO2 of 33 is in the setting of his diuresis -Avoid further nephrotoxic medications, contrast dyes, hypotension and renally adjust medication -Continue to monitor and trend renal function -Repeat CMP in a.m.  Paroxysmal Atrial Fibrillation -Has had atrial fibrillation with RVR -He was started on IV amiodarone and has converted to normal sinus rhythm -Remained on amiodarone drip at 30 mg/h per Cardiology but since Milrinone is stopping patient is going to be changed to Amiodarone 200 mg po BID -Continue with anticoagulation with apixaban 2.5 mg p.o. twice daily given his renal function as well as age -Continue with telemetry monitoring  Acute E. coli UTI with Hematuria, improving -Initial urinalysis could not test for nitrites or leukocytes given that it was blood  but did show many bacteria and greater than 50 RBCs per high-power field as well as greater than 50 WBCs  -Urine Cx showed pansensative E Coli -Initially was on IV Ceftriaxone and was changed to Augmentin 500 BID to complete 7 abx tx -Continue to monitor carefully and Urine is clearing   Hypothyroidism -No TSH done when he was Admitted  -Continue with Levothyroxine 125 mcg p.o. daily before breakfast  Constipation -Adjusted his bowel regimen and stopped his docusate 100 mg p.o. twice daily and change to senna docusate 1 tab p.o. twice daily along with MiraLAX 17 g p.o. twice daily and he currently also has suppositories with bisacodyl 10 mg daily RC as needed -Patient has not had a bowel movement   CAD -Underwent PCI stenting and 2019 -Denies any current chest pain -Continue with Isosorbide mononitrate 30 mg p.o. daily and with Rosuvastatin 10 mg p.o. daily -Currently off of Plavix and continuing anticoagulation with Eliquis for his atrial fibrillation  Hyperlipidemia -Continue with Rosuvastatin 10 mg p.o. daily  BPH/history of Nephrolithiasis -Continue with Finasteride 5 mg po Daily  -Continue with Tamsulosin 0.4 mg p.o. nightly  Diabetes mellitus type 2, on oral agents only. -Hemoglobin A1c 5.7. -Continue with moderate NovoLog/scale insulin before meals and at bedtime -CBGs have been relatively well controlled and ranged from 112-153; blood sugar on this a.m. his  BMP is 188 -Continue to monitor and further adjust insulin as necessary  NormocyticAnemia, fecal occult blood positive -Patient is hemoglobin/hematocrit has been relatively stable last 9 days or so and today was 8.5/27.6 -Anemia panel was done and showed a iron level of 31, U IBC of 395, TIBC of 426, saturation ratios of 7%, ferritin of 34, folate of 3.8, and vitamin B12 level of 2064 -Was seen by Gastroenterology with recommendation for outpatient follow-up. -He was treated with [facility] March 17.  -Patient will  follow-up with [facility] as an outpatient.  Essential Hypertension -Antihypertensives held per cardiology continue to hold his carvedilol and hydralazine/Imdur -BP was improved and was 115/62 this AM  -Continue to monitor blood pressures per protocol  Obesity -Estimated body mass index is 34.32 kg/m as calculated from the following:   Height as of this encounter:  (1.702 m).   Weight as of this encounter: 99.4 kg. -Weight Loss and Dietary Counseling given   DVT prophylaxis: Anticoagulated with Apixaban 2.5 mg po BID  Code Status: FULL CODE  Family Communication: No family present at bedside  Disposition Plan: Patient is from home and he came in significantly volume overloaded due to missed diuretics.  Will need to be adequately diuresed back to dry weight per cardiology and off of milrinone and Lasix drip prior to safe discharge.  Currently he is being weaned off of the milrinone and Lasix drip and off of the amiodarone drip and going to be changed to p.o. amiodarone as well as p.o. torsemide.  He will also need PT OT prior to discharge given his prolonged hospitalization  Consultants:   Cardiology Advanced Heart Failure Team   Gastroenterology  Procedures:  Right Heart Catheterization 02/19/20 1. Markedly elevated right and left heart filling pressures.  2. Preserved cardiac output.  3. Pulmonary venous hypertension.    Antimicrobials:  Anti-infectives (From admission, onward)   Start     Dose/Rate Route Frequency Ordered Stop   02/18/20 0845  amoxicillin-clavulanate (AUGMENTIN) 500-125 MG per tablet 500 mg     1 tablet Oral Every 12 hours 02/18/20 0831     02/16/20 1130  cefTRIAXone (ROCEPHIN) 1 g in sodium chloride 0.9 % 100 mL IVPB  Status:  Discontinued     1 g 200 mL/hr over 30 Minutes Intravenous Every 24 hours 02/16/20 1025 02/18/20 0831     Subjective: Seen and examined at bedside and states he still has not gotten any rest but states his breathing is doing  good.  Also has not had a bowel movement but states that he is "working on it".  No nausea or vomiting.  No other concerns or complaints at this time.  Objective: Vitals:   02/21/20 2100 02/22/20 0451 02/22/20 0500 02/22/20 0735  BP: 120/63 110/64  115/62  Pulse: 71 68  67  Resp: 19 19  (!) 21  Temp:  97.8 F (36.6 C)  98.2 F (36.8 C)  TempSrc:  Oral  Oral  SpO2: 95%   100%  Weight:   99.4 kg   Height:        Intake/Output Summary (Last 24 hours) at 02/22/2020 0803 Last data filed at 02/22/2020 4098 Gross per 24 hour  Intake 2632.06 ml  Output 2685 ml  Net -52.94 ml   Filed Weights   02/20/20 0400 02/21/20 0549 02/22/20 0500  Weight: 100.2 kg 99 kg 99.4 kg   Examination:  Physical Exam:  Constitutional: WN/WD obese male in no acute distress appears calm but  wanting to sleep Eyes: Lids and conjunctivae normal, sclerae anicteric  ENMT: External Ears, Nose appear normal. Grossly normal hearing.   Neck: Appears normal, supple, no cervical masses, normal ROM, no appreciable thyromegaly;; no JVD Respiratory: Diminished to auscultation bilaterally with mild coarse breath sounds, no wheezing, rales, rhonchi or crackles. Normal respiratory effort and patient is not tachypenic. No accessory muscle use.  Unlabored breathing but is wearing 3 L of supplemental oxygen via nasal cannula Cardiovascular: RRR, no murmurs / rubs / gallops. S1 and S2 auscultated.  1+ extremity edema.  Abdomen: Soft, non-tender, distended secondary body habitus. No masses palpated. Bowel sounds positive x4.  GU: Deferred. Musculoskeletal: No clubbing / cyanosis of digits/nails. No joint deformity upper and lower extremities.  Skin: No rashes, lesions, ulcers on a limited skin evaluation. No induration; Warm and dry.  Neurologic: CN 2-12 grossly intact with no focal deficits. Romberg sign and cerebellar reflexes not assessed.  Psychiatric: Normal judgment and insight. Alert and oriented x 3. Normal mood and  appropriate affect.   Data Reviewed: I have personally reviewed following labs and imaging studies  CBC: Recent Labs  Lab 02/18/20 0119 02/18/20 0119 02/19/20 0435 02/19/20 1306 02/20/20 0409 02/21/20 0342 02/22/20 0524  WBC 6.4  --  6.4  --  6.1 6.1 6.3  NEUTROABS 4.2  --  4.1  --  4.0 4.1 4.1  HGB 8.4*   < > 8.5* 9.9*  9.5* 8.4* 8.3* 8.5*  HCT 27.1*   < > 27.3* 29.0*  28.0* 26.6* 27.3* 27.6*  MCV 93.1  --  93.5  --  97.4 97.2 96.8  PLT 231  --  215  --  172 166 158   < > = values in this interval not displayed.   Basic Metabolic Panel: Recent Labs  Lab 02/18/20 0119 02/18/20 0119 02/19/20 0435 02/19/20 1306 02/20/20 0409 02/21/20 0342 02/22/20 0524  NA 135   < > 136 140  141 136 138 138  K 4.6   < > 4.5 4.7  4.5 4.3 3.9 4.2  CL 98  --  103  --  99 99 92*  CO2 23  --  24  --  28 31 33*  GLUCOSE 114*  --  118*  --  196* 174* 188*  BUN 86*  --  79*  --  65* 60* 49*  CREATININE 2.74*  --  2.23*  --  1.95* 1.70* 1.54*  CALCIUM 8.8*  --  8.8*  --  8.8* 8.6* 8.8*  MG  --   --   --   --  2.3 1.9 1.9  PHOS  --   --   --   --   --  3.1 2.7   < > = values in this interval not displayed.   GFR: Estimated Creatinine Clearance: 40.8 mL/min (A) (by C-G formula based on SCr of 1.54 mg/dL (H)). Liver Function Tests: Recent Labs  Lab 02/21/20 0342 02/22/20 0524  AST 32 34  ALT 18 18  ALKPHOS 57 54  BILITOT 0.8 0.9  PROT 6.9 6.9  ALBUMIN 2.5* 2.4*   No results for input(s): LIPASE, AMYLASE in the last 168 hours. No results for input(s): AMMONIA in the last 168 hours. Coagulation Profile: No results for input(s): INR, PROTIME in the last 168 hours. Cardiac Enzymes: No results for input(s): CKTOTAL, CKMB, CKMBINDEX, TROPONINI in the last 168 hours. BNP (last 3 results) No results for input(s): PROBNP in the last 8760 hours. HbA1C: No results for input(s):  HGBA1C in the last 72 hours. CBG: Recent Labs  Lab 02/21/20 0552 02/21/20 1205 02/21/20 1552 02/21/20  2315 02/22/20 0544  GLUCAP 112* 136* 134* 153* 113*   Lipid Profile: No results for input(s): CHOL, HDL, LDLCALC, TRIG, CHOLHDL, LDLDIRECT in the last 72 hours. Thyroid Function Tests: No results for input(s): TSH, T4TOTAL, FREET4, T3FREE, THYROIDAB in the last 72 hours. Anemia Panel: No results for input(s): VITAMINB12, FOLATE, FERRITIN, TIBC, IRON, RETICCTPCT in the last 72 hours. Sepsis Labs: No results for input(s): PROCALCITON, LATICACIDVEN in the last 168 hours.  Recent Results (from the past 240 hour(s))  Culture, Urine     Status: Abnormal   Collection Time: 02/15/20 11:39 AM   Specimen: Urine, Clean Catch  Result Value Ref Range Status   Specimen Description URINE, CLEAN CATCH  Final   Special Requests   Final    NONE Performed at [facility], [address] [place], [place] [address]    Culture >=100,000 COLONIES/mL ESCHERICHIA COLI (A)  Final   Report Status 02/17/2020 FINAL  Final   Organism ID, Bacteria ESCHERICHIA COLI (A)  Final      Susceptibility   Escherichia coli - MIC*    AMPICILLIN <=2 SENSITIVE Sensitive     CEFAZOLIN <=4 SENSITIVE Sensitive     CEFTRIAXONE <=0.25 SENSITIVE Sensitive     CIPROFLOXACIN <=0.25 SENSITIVE Sensitive     GENTAMICIN <=1 SENSITIVE Sensitive     IMIPENEM <=0.25 SENSITIVE Sensitive     NITROFURANTOIN <=16 SENSITIVE Sensitive     TRIMETH/SULFA <=20 SENSITIVE Sensitive     AMPICILLIN/SULBACTAM <=2 SENSITIVE Sensitive     PIP/TAZO <=4 SENSITIVE Sensitive     * >=100,000 COLONIES/mL ESCHERICHIA COLI  Surgical pcr screen     Status: Abnormal   Collection Time: 02/19/20  3:51 AM   Specimen: Nasal Mucosa; Nasal Swab  Result Value Ref Range Status   MRSA, PCR POSITIVE (A) NEGATIVE Final    Comment: RESULT CALLED TO, READ BACK BY AND VERIFIED WITH: [name] RN 02/19/20 0510 JDW    Staphylococcus aureus POSITIVE (A) NEGATIVE Final    Comment: (NOTE) The Xpert SA Assay (FDA approved for NASAL specimens in patients 22 years  of age and older), is one component of a comprehensive surveillance program. It is not intended to diagnose infection nor to guide or monitor treatment. Performed at [facility], [address], [place], [place] [address]      RN Pressure Injury Documentation:     Estimated body mass index is 34.32 kg/m as calculated from the following:   Height as of this encounter: 5\' 7"  (1.702 m).   Weight as of this encounter: 99.4 kg.  Malnutrition Type:      Malnutrition Characteristics:      Nutrition Interventions:    Radiology Studies: DG CHEST PORT 1 VIEW  Result Date: 02/22/2020 CLINICAL DATA:  Shortness of breath. Fluid overload. History of CHF. EXAM: PORTABLE CHEST 1 VIEW COMPARISON:  02/21/2020. FINDINGS: Right PICC line noted with tip over cavoatrial junction. Cardiac pacer in stable position. Stable cardiomegaly. Pulmonary venous congestion. Mild bilateral interstitial prominence suggesting CHF noted on today's exam. Bibasilar atelectasis/infiltrates. Moderate right-sided pleural effusion, increased from prior exam. No pneumothorax. IMPRESSION: 1.  Right PICC line noted with tip over SVC. 2. Cardiac pacer stable position. Stable cardiomegaly. Pulmonary venous congestion. Mild bilateral interstitial prominence suggesting CHF noted on today's exam. Moderate right-sided pleural effusion, increased from prior exam. 3.  Bibasilar atelectasis/infiltrates. Electronically Signed  ByMaisie Fus  Register   On: 02/22/2020 06:25   Scheduled Meds: . allopurinol  200 mg Oral Daily  . amoxicillin-clavulanate  1 tablet Oral Q12H  . apixaban  2.5 mg Oral BID  . Chlorhexidine Gluconate Cloth  6 each Topical Daily  . diclofenac Sodium  4 g Topical QID  . finasteride  5 mg Oral Daily  . insulin aspart  0-15 Units Subcutaneous TID WC  . insulin aspart  0-5 Units Subcutaneous QHS  . isosorbide mononitrate  30 mg Oral Daily  . levothyroxine  125 mcg Oral QAC breakfast  . magnesium oxide   400 mg Oral Daily  . mupirocin ointment  1 application Nasal BID  . polyethylene glycol  17 g Oral BID  . rosuvastatin  10 mg Oral q1800  . senna-docusate  1 tablet Oral BID  . sertraline  25 mg Oral Daily  . sodium chloride flush  3 mL Intravenous Q12H  . sodium chloride flush  3 mL Intravenous Q12H  . tamsulosin  0.4 mg Oral QHS  . cyanocobalamin  1,000 mcg Oral Daily   Continuous Infusions: . sodium chloride    . amiodarone 30 mg/hr (02/22/20 0417)  . furosemide (LASIX) infusion 12 mg/hr (02/22/20 0621)  . milrinone 0.125 mcg/kg/min (02/21/20 1240)    LOS: 10 days   Merlene Laughter, DO [facility] PAGER is on AMION  If 7PM-7AM, please contact night-coverage [email or web address]

## 2020-02-22 NOTE — Progress Notes (Signed)
PT Cancellation Note  Patient Details Name: Ryan Macdonald MRN: [id number] DOB: 01-06-1934   Cancelled Treatment:    Reason Eval/Treat Not Completed: Patient declined, no reason specified. Upon arrival for PT tx pt sleeping soundly. Pt politely declining PT this date secondary to having a third straight night of not getting much sleep. Pt reporting he saw the doctor this morning and ambulated within the room. PT offered to come back this afternoon to check on him with pt declining. PT will follow up at later date/time.   [name] PT, DPT 10:47 AM,02/22/20    [name] [name] 02/22/2020, 10:46 AM

## 2020-02-22 NOTE — Progress Notes (Signed)
Orthopedic Tech Progress Note Patient Details:  Ryan Macdonald 02/10/1934 [id number]  Ortho Devices Type of Ortho Device: Radio broadcast assistant Ortho Device/Splint Location: bilateral unna boots Ortho Device/Splint Interventions: Application   Post Interventions Patient Tolerated: Well Instructions Provided: Care of device   Saul Fordyce 02/22/2020, 7:38 AM

## 2020-02-22 NOTE — Progress Notes (Signed)
OT Cancellation Note  Patient Details Name: Ryan Macdonald MRN: [id number] DOB: [birth year]   Cancelled Treatment:     Pt declning therapy this PM, stating he has been "up for 3 days without any sleep" and told the doctor he is not walking around on no sleep. Will continue to follow as available and appropriate to initiate OT POC.  Dalphine Handing, MSOT, OTR/L Acute Rehabilitation Services [facility] Office Number: [phone] Pager: [phone]  Dalphine Handing 02/22/2020, 4:03 PM

## 2020-02-23 DIAGNOSIS — N183 Chronic kidney disease, stage 3 unspecified: Secondary | ICD-10-CM

## 2020-02-23 DIAGNOSIS — I480 Paroxysmal atrial fibrillation: Secondary | ICD-10-CM

## 2020-02-23 LAB — COMPREHENSIVE METABOLIC PANEL
ALT: 20 U/L (ref 0–44)
AST: 34 U/L (ref 15–41)
Albumin: 2.5 g/dL — ABNORMAL LOW (ref 3.5–5.0)
Alkaline Phosphatase: 57 U/L (ref 38–126)
Anion gap: 10 (ref 5–15)
BUN: 43 mg/dL — ABNORMAL HIGH (ref 8–23)
CO2: 35 mmol/L — ABNORMAL HIGH (ref 22–32)
Calcium: 9.1 mg/dL (ref 8.9–10.3)
Chloride: 94 mmol/L — ABNORMAL LOW (ref 98–111)
Creatinine, Ser: 1.43 mg/dL — ABNORMAL HIGH (ref 0.61–1.24)
GFR calc Af Amer: 52 mL/min — ABNORMAL LOW (ref >60)
GFR calc non Af Amer: 45 mL/min — ABNORMAL LOW (ref >60)
Glucose, Bld: 111 mg/dL — ABNORMAL HIGH (ref 70–99)
Potassium: 4.1 mmol/L (ref 3.5–5.1)
Sodium: 139 mmol/L (ref 135–145)
Total Bilirubin: 1.2 mg/dL (ref 0.3–1.2)
Total Protein: 7.3 g/dL (ref 6.5–8.1)

## 2020-02-23 LAB — CBC WITH DIFFERENTIAL/PLATELET
Abs Immature Granulocytes: 0.02 10*3/uL (ref 0.00–0.07)
Basophils Absolute: 0 10*3/uL (ref 0.0–0.1)
Basophils Relative: 0 %
Eosinophils Absolute: 0.3 10*3/uL (ref 0.0–0.5)
Eosinophils Relative: 5 %
HCT: 30 % — ABNORMAL LOW (ref 39.0–52.0)
Hemoglobin: 9.3 g/dL — ABNORMAL LOW (ref 13.0–17.0)
Immature Granulocytes: 0 %
Lymphocytes Relative: 16 %
Lymphs Abs: 1 10*3/uL (ref 0.7–4.0)
MCH: 29.7 pg (ref 26.0–34.0)
MCHC: 31 g/dL (ref 30.0–36.0)
MCV: 95.8 fL (ref 80.0–100.0)
Monocytes Absolute: 0.9 10*3/uL (ref 0.1–1.0)
Monocytes Relative: 14 %
Neutro Abs: 4.1 10*3/uL (ref 1.7–7.7)
Neutrophils Relative %: 65 %
Platelets: 172 10*3/uL (ref 150–400)
RBC: 3.13 MIL/uL — ABNORMAL LOW (ref 4.22–5.81)
RDW: 18.9 % — ABNORMAL HIGH (ref 11.5–15.5)
WBC: 6.3 10*3/uL (ref 4.0–10.5)
nRBC: 0 % (ref 0.0–0.2)

## 2020-02-23 LAB — GLUCOSE, CAPILLARY
Glucose-Capillary: 128 mg/dL — ABNORMAL HIGH (ref 70–99)
Glucose-Capillary: 178 mg/dL — ABNORMAL HIGH (ref 70–99)
Glucose-Capillary: 194 mg/dL — ABNORMAL HIGH (ref 70–99)
Glucose-Capillary: 171 mg/dL — ABNORMAL HIGH (ref 70–99)

## 2020-02-23 LAB — COOXEMETRY PANEL
Carboxyhemoglobin: 2 % — ABNORMAL HIGH (ref 0.5–1.5)
Methemoglobin: 1.1 % (ref 0.0–1.5)
O2 Saturation: 74 %
Total hemoglobin: 13.7 g/dL (ref 12.0–16.0)

## 2020-02-23 LAB — PHOSPHORUS: Phosphorus: 2.6 mg/dL (ref 2.5–4.6)

## 2020-02-23 LAB — MAGNESIUM: Magnesium: 1.7 mg/dL (ref 1.7–2.4)

## 2020-02-23 MED ORDER — SPIRONOLACTONE 12.5 MG HALF TABLET
12.5000 mg | ORAL_TABLET | Freq: Every day | ORAL | Status: DC
  Administered 2020-02-23 – 2020-02-24 (×2): 12.5 mg via ORAL
  Filled 2020-02-23 (×2): qty 1

## 2020-02-23 MED ORDER — BISACODYL 10 MG RE SUPP
10.0000 mg | Freq: Once | RECTAL | Status: DC
  Filled 2020-02-23: qty 1

## 2020-02-23 NOTE — Progress Notes (Signed)
Patient ID: Ryan Macdonald, male   DOB: 1934-07-31, 86 y.o.   MRN: [id number]     Advanced Heart Failure Rounding Note  PCP-Cardiologist: [name], MD   Subjective:    Creatinine trending down 2.86>2.7>2.23>1.95>1.7 >1.5>1.4  Off milrinone, CO-OX 74%.   E coli UTI noted.  Ceftriaxone started, now on Augmentin.  Urine clear.  Reports dyspnea is improving.   [facility] Procedural Findings (3/23): Hemodynamics (mmHg) RA mean 23 RV 74/27 PA 75/30, mean 49 PCWP mean 39 Oxygen saturations: PA 68% AO 98% Cardiac Output (Fick) 8.22  Cardiac Index (Fick) 3.84 PVR 1.2 WU    Objective:   Weight Range: 97.5 kg Body mass index is 33.67 kg/m.   Vital Signs:   Temp:  [97.9 F (36.6 C)-98.6 F (37 C)] 98 F (36.7 C) (03/27 0800) Pulse Rate:  [72-92] 79 (03/27 0620) Resp:  [19-25] 20 (03/27 0620) BP: (114-124)/(62-72) 114/62 (03/27 0618) SpO2:  [95 %-99 %] 97 % (03/27 0620) Weight:  [97.5 kg] 97.5 kg (03/27 0620) Last BM Date: 02/20/20  Weight change: Filed Weights   02/21/20 0549 02/22/20 0500 02/23/20 0620  Weight: 99 kg 99.4 kg 97.5 kg    Intake/Output:   Intake/Output Summary (Last 24 hours) at 02/23/2020 1252 Last data filed at 02/23/2020 1242 Gross per 24 hour  Intake 898.32 ml  Output 1751 ml  Net -852.68 ml      Physical Exam    General:  No resp difficulty HEENT: normal Neck: supple. No JVD. C Cor: Regular rate & rhythm. No rubs, gallops or murmurs. Lungs: clear Abdomen: soft, nontender, nondistended. No hepatosplenomegaly. No bruits or masses. Good bowel sounds. Extremities: no cyanosis, clubbing, rash, R and LLE unna boots. RUE PICC   Telemetry   NSR 60s personally reviewed.   Labs    CBC Recent Labs    02/22/20 0524 02/23/20 0530  WBC 6.3 6.3  NEUTROABS 4.1 4.1  HGB 8.5* 9.3*  HCT 27.6* 30.0*  MCV 96.8 95.8  PLT 158 172   Basic Metabolic Panel Recent Labs    25/36/64 0524 02/23/20 0530  NA 138 139  K 4.2 4.1  CL 92* 94*    CO2 33* 35*  GLUCOSE 188* 111*  BUN 49* 43*  CREATININE 1.54* 1.43*  CALCIUM 8.8* 9.1  MG 1.9 1.7  PHOS 2.7 2.6   Liver Function Tests Recent Labs    02/22/20 0524 02/23/20 0530  AST 34 34  ALT 18 20  ALKPHOS 54 57  BILITOT 0.9 1.2  PROT 6.9 7.3  ALBUMIN 2.4* 2.5*   No results for input(s): LIPASE, AMYLASE in the last 72 hours. Cardiac Enzymes No results for input(s): CKTOTAL, CKMB, CKMBINDEX, TROPONINI in the last 72 hours.  BNP: BNP (last 3 results) Recent Labs    02/11/20 1739  BNP 1,569.0*    ProBNP (last 3 results) No results for input(s): PROBNP in the last 8760 hours.   D-Dimer No results for input(s): DDIMER in the last 72 hours. Hemoglobin A1C No results for input(s): HGBA1C in the last 72 hours. Fasting Lipid Panel No results for input(s): CHOL, HDL, LDLCALC, TRIG, CHOLHDL, LDLDIRECT in the last 72 hours. Thyroid Function Tests No results for input(s): TSH, T4TOTAL, T3FREE, THYROIDAB in the last 72 hours.  Invalid input(s): FREET3  Other results:   Imaging    No results found.   Medications:     Scheduled Medications: . allopurinol  200 mg Oral Daily  . amiodarone  200 mg Oral BID  .  amoxicillin-clavulanate  1 tablet Oral Q12H  . apixaban  2.5 mg Oral BID  . bisacodyl  10 mg Rectal Once  . Chlorhexidine Gluconate Cloth  6 each Topical Daily  . diclofenac Sodium  4 g Topical QID  . finasteride  5 mg Oral Daily  . hydrALAZINE  12.5 mg Oral Q8H  . insulin aspart  0-15 Units Subcutaneous TID WC  . insulin aspart  0-5 Units Subcutaneous QHS  . isosorbide mononitrate  30 mg Oral Daily  . levothyroxine  125 mcg Oral QAC breakfast  . magnesium oxide  400 mg Oral Daily  . mupirocin ointment  1 application Nasal BID  . polyethylene glycol  17 g Oral BID  . rosuvastatin  10 mg Oral q1800  . senna-docusate  1 tablet Oral BID  . sertraline  25 mg Oral Daily  . sodium chloride flush  3 mL Intravenous Q12H  . sodium chloride flush  3 mL  Intravenous Q12H  . tamsulosin  0.4 mg Oral QHS  . torsemide  40 mg Oral q1800  . torsemide  80 mg Oral QAC breakfast  . cyanocobalamin  1,000 mcg Oral Daily    Infusions: . sodium chloride      PRN Medications: sodium chloride, acetaminophen, bisacodyl, guaiFENesin-codeine, HYDROcodone-acetaminophen, ipratropium-albuterol, methocarbamol, ondansetron (ZOFRAN) IV, sodium chloride flush, sodium chloride flush   Assessment/Plan   1. Acute on chronic systolic CHF: Primarily nonischemic cardiomyopathy. EF has been low since at least 2014.  He has single chamber Biotron[employer] ICD. QRS not wide enough for CRT.Settings adjusted in the past to prevent RV pacing in the face of a long 1st degree AV block.  TEE in 2/21 with EF 20-25%, diffuse hypokinesis and mild LVH, mild RV systolic dysfunction. He was admitted after taking the wrong meds (no diuretic) for a number of days and developing volume overload.  He has converted to NSR on amiodarone.  Hydralazine and Coreg stopped with soft BP, SBP now 110s-120s.  Diuretics held with AKI for several days.  Creatinine improving.  [place] 3/23 showed markedly elevated right and left-sided filling pressures with pulmonary venous hypertension and preserved cardiac output.  He was started on milrinone 0.125 and Lasix gtt.   Stoped lasix drip 3/26 and started torsemide 80 mg in am and 40 mg in pm. (home dose torsemide 60 daily)  - Off milrinone, Co-ox 74% - Will leave off Coreg for now.  - Continue hydralazine 12.5 mg three times a day and continue imdur 30 mg daily  - Will add spironolactone 12.5 mg daily - Renal function stable.  2. CAD: S/p PCI 07/2018 to pLAD but CAD does not explain his cardiomyopathy.   - No chest pain.  - He is off Plavix now, on Eliquis for his atrial fibrillation.  - Continue Crestor 3.Atrial fibrillation: He had been in atrial fibrillation initially this admission but now back in NSR on amiodarone.   - Eliquis held for hematuria but later  restarted.  On reduced dose with age and elevated creatinine.  - Continue amio 200 mg po twice a day, plan for 10 days then decrease to 200 mg daily - He has history of hypothyroidism with amiodarone in the past but is on Levoxyl.  4. AKI on CKD stage 3: Creatinine down to 1.4     5. Hypothyroidism: Suspect this is due to amiodarone.  He has been on Levoxyl.  6. Anemia: Fe deficiency with FOBT+.  He denies overt bleeding.  He has had feraheme.  GI  saw him and plan outpatient workup.  - Hgb stable 8.5   7. ID: E coli UTI.  - on Augmentin.   Little Ishikawa NP-C  02/23/2020 12:52 PM

## 2020-02-23 NOTE — Evaluation (Signed)
Occupational Therapy Evaluation Patient Details Name: Ryan Macdonald MRN: [id number] DOB: 06/14/1934 Today's Date: 02/23/2020    History of Present Illness 86 y.o. male with medical history significant of NICM EF 20-25% on TEE last month, AICD placement, PAF on eliquis had DCCV last month, h/o PE, DM2, cardiac stenting, and cardioversion 12/2019. Pt admitted to ED on 3/15 with CHF exacerbation.   Clinical Impression   Pt admitted with see above. Pt currently with functional limitations due to the deficits listed below (see OT Problem List). Pt was able to complete sit to stand transfers with min guard with BUE support. Pt was able to remain in standing position with o2 3L via [place] with o2 89-97% for about 10 min interval but no reports of SOB or dizziness. Pt was reporting they do not need to use O2 but educated on status.  Pt will benefit from skilled OT to increase their safety and independence with ADL and functional mobility for ADL to facilitate discharge to venue listed below.       Follow Up Recommendations  Supervision/Assistance - 24 hour;Home health OT    Equipment Recommendations       Recommendations for Other Services       Precautions / Restrictions Precautions Precautions: Fall Precaution Comments: watch O2 Restrictions Weight Bearing Restrictions: No      Mobility Bed Mobility               General bed mobility comments: pt sitting EOB  Transfers Overall transfer level: Needs assistance Equipment used: Rolling walker (2 wheeled) Transfers: Sit to/from Stand Sit to Stand: Min guard         General transfer comment: cues for safe hand placement    Balance Overall balance assessment: Needs assistance Sitting-balance support: No upper extremity supported;Feet supported Sitting balance-Leahy Scale: Good Sitting balance - Comments: mod I                                   ADL either performed or assessed with clinical judgement   ADL  Overall ADL's : Needs assistance/impaired Eating/Feeding: Independent;Sitting   Grooming: Wash/dry hands;Wash/dry face;Modified independent;Standing   Upper Body Bathing: Modified independent;Sitting   Lower Body Bathing: Minimal assistance;Cueing for safety;Sit to/from stand   Upper Body Dressing : Sitting;Supervision/safety   Lower Body Dressing: Supervision/safety;Sit to/from stand   Toilet Transfer: Min guard;Comfort height toilet;Grab bars   Toileting- Water quality scientist and Hygiene: Min guard;Sit to/from stand;Cueing for safety;Cueing for sequencing   Tub/ Shower Transfer: Min guard   Functional mobility during ADLs: Min guard;Cueing for safety;Cueing for sequencing;Rolling walker       Vision Patient Visual Report: No change from baseline Vision Assessment?: No apparent visual deficits     Perception Perception Perception Tested?: No   Praxis Praxis Praxis tested?: Not tested    Pertinent Vitals/Pain Pain Assessment: 0-10 Pain Score: 1  Faces Pain Scale: Hurts a little bit Pain Location: LEs, mostly fatigue Pain Descriptors / Indicators: Tiring     Hand Dominance Right   Extremity/Trunk Assessment Upper Extremity Assessment Upper Extremity Assessment: Overall WFL for tasks assessed   Lower Extremity Assessment Lower Extremity Assessment: Defer to PT evaluation   Cervical / Trunk Assessment Cervical / Trunk Assessment: Normal   Communication Communication Communication: No difficulties   Cognition Arousal/Alertness: Awake/alert Behavior During Therapy: WFL for tasks assessed/performed Overall Cognitive Status: Within Functional Limits for tasks assessed  General Comments       Exercises     Shoulder Instructions      Home Living Family/patient expects to be discharged to:: Private residence Living Arrangements: Spouse/significant other Available Help at Discharge: Family Type of Home:  Apartment Home Access: Stairs to enter Secretary/administrator of Steps: 8 Entrance Stairs-Rails: Right Home Layout: One level     Bathroom Shower/Tub: Chief Strategy Officer: Standard Bathroom Accessibility: Yes How Accessible: Accessible via walker Home Equipment: Walker - 2 wheels;Cane - single point          Prior Functioning/Environment Level of Independence: Independent with assistive device(s)        Comments: pt reports using cane and RW for ambulation as needed, states he typically does not use AD to ambulate. Pt able to perform ADLs for self.        OT Problem List: Decreased strength;Decreased activity tolerance;Impaired balance (sitting and/or standing);Decreased safety awareness;Decreased knowledge of use of DME or AE;Pain      OT Treatment/Interventions: Self-care/ADL training;Therapeutic exercise;Therapeutic activities;Patient/family education;Balance training    OT Goals(Current goals can be found in the care plan section) Acute Rehab OT Goals Patient Stated Goal: to get rested OT Goal Formulation: With patient Time For Goal Achievement: 03/08/20 Potential to Achieve Goals: Good  OT Frequency: Min 2X/week   Barriers to D/C:            Co-evaluation              AM-PAC OT "6 Clicks" Daily Activity     Outcome Measure Help from another person eating meals?: A Little Help from another person taking care of personal grooming?: None Help from another person toileting, which includes using toliet, bedpan, or urinal?: A Little Help from another person bathing (including washing, rinsing, drying)?: A Little Help from another person to put on and taking off regular upper body clothing?: None Help from another person to put on and taking off regular lower body clothing?: A Little 6 Click Score: 20   End of Session Equipment Utilized During Treatment: Gait belt;Rolling walker  Activity Tolerance: Patient tolerated treatment well Patient  left: in bed;with call bell/phone within reach  OT Visit Diagnosis: Unsteadiness on feet (R26.81);Other abnormalities of gait and mobility (R26.89);Muscle weakness (generalized) (M62.81);Pain Pain - part of body: (back)                Time: 8299-3716 OT Time Calculation (min): 34 min Charges:  OT General Charges $OT Visit: 1 Visit OT Evaluation $OT Eval Low Complexity: 1 Low  Alphia Moh OTR/L  Acute Rehab Services  [phone] office number [phone] pager number   Alphia Moh 02/23/2020, 12:54 PM

## 2020-02-23 NOTE — Progress Notes (Signed)
Labs drawn at 0530 and sent in tube at this time. Notified Shaneka in lab of co ox being sent.

## 2020-02-23 NOTE — Progress Notes (Signed)
PROGRESS NOTE    Ryan Macdonald  TGG:[id number] DOB: 02/11/1934 DOA: 02/11/2020 PCP: [place]  Brief Narrative:  The patient is a 86 year old male with a past medical history significant for nonischemic cardiomyopathy with an EF of 20 to 25% on TEE last month, status post AICD placement, history of PAF on Eliquis who had DCCV last month as well history of PE diabetes mellitus type 2 and other comorbidities who presented with worsening swelling and lower extremity edema and increased cough and productive sputum.  Weight was up 30 pounds.  He was found to have acute on chronic systolic CHF and was in A. fib with RVR.  Currently he is being followed by the advanced heart failure team and had been treated with diuresis as well as rhythm management with amiodarone but his diuresis has been held for several days due to an AKI.  Because of his volume overload he required close monitoring and he underwent a right heart cath to assess his hemodynamics.  The right heart cath on 02/19/2020 showed markedly elevated right and left-sided filling pressures with pulmonary venous hypertension and preserved cardiac output.  Cardiology started the patient on milrinone and Lasix drip currently is being diuresed by them and is improving and weight is coming down and he is having good output with improving Renal Fxn.  Cardiology has stoppedhis Milrinone and changing his Lasix drip to Torsemide 80 mg in a.m. and 40 mg in the p.m. currently is off of Plavix for CAD and currently on Eliquis for his atrial fibrillation.  Stopped his antibiotics now.  Cardiology has now added spironolactone 12.5 mg p.o. daily and continue to hold his Coreg.  Assessment & Plan:   Principal Problem:   Acute on chronic heart failure (HCC) Active Problems:   Obesity (BMI 30-39.9)   Essential hypertension   Atrial fibrillation (HCC)   Chronic renal insufficiency, stage 3 (moderate) (HCC)   Diabetes mellitus type 2 in obese (HCC)   Morbid obesity (HCC)   Constipation   Anemia   Hematuria   CKD (chronic kidney disease), stage III  Acute on chronic systolic CHF primarily Nonischemic Cardiomyopathy ?Cardiorenal syndrome Acute on chronic respiratory failure with hypoxia in the setting of above; patient wears a baseline of 3 L of supplemental oxygen via nasal cannula at home and this is confirmed today -Continues to have some edema but this is improving somewhat  -Has had a known low EF since 2014 and has a single-chamber [employer] ICD -Underwent a TEE in February 2021 which showed an EF of 20 to 25% with diffuse hypokinesis mild LVH as well as mild RV systolic dysfunction -Likely his symptoms developed in the setting of noncompliance after he is taken the wrong medication for a number of days; i.e. no diuretic -Presented with A. fib with RVR now is in normal sinus rhythm after IV amiodarone -Cardiology was consulted and they have held his Hydralazine and Coreg due to soft blood pressures but his blood pressures are now 110s to 120s; Last BP was 108/65 -Cardiology still holding off of Carvedilol.he is started Imdur 30 mg p.o. daily and also added hydralazine 12.5 mg 3 times daily -They have added spironolactone 12.5 mg p.o. daily today -Initially with diuretics were also held due to AKI for several days -Patient underwent a right heart catheterization which showed markedly elevated right and left-sided filling pressures with pulmonary venous hypertension preserved cardiac output -Cardiology started the patient on a milrinone drip as well as a Lasix drip  and he remains on 0.125 mcg of Milrinone and 12 mg/hr of Lasix gtt; Cardiology is to stop the Lasix drip and milrinone drip and Start Torsemide 80 mg in the a.m. and 40 mg in the p.m.; he is on diuresis -Continues to have diuresis and patient is Negative 11.362 L since admission -Weight is down 19 pounds since his peak on 02/11/2020 he is now 214 pounds with a dry weight of  199 -BNP on admission was 1569 -Continue strict I's and O's, daily weights as well as 2 g sodium diet -Continue to monitor his respiratory status carefully as he periodically wears oxygen at home -Patient's carboxyhemoglobin was 2.0 and methemoglobin was 1.1 -Co-Ox was 74.0% -SpO2: 99 % O2 Flow Rate (L/min): 3 L/min  -Continuous pulse oximetry and maintain O2 saturations greater than 92% next-continue supplemental oxygen via nasal cannula and wean O2 as tolerated -Repeat CXR on 02/22/20 showed "Right PICC line noted with tip over cavoatrial junction. Cardiac pacer in stable position. Stable cardiomegaly. Pulmonary venous congestion. Mild bilateral interstitial prominence suggesting CHF noted on today's exam. Bibasilar atelectasis/infiltrates. Moderate right-sided pleural effusion, increased from prior exam. No Pneumothorax."  -Patient will need an ambulatory home O2 screen prior to discharge but he wears 3 L intermittently at home -Patient's diuresis has been changed to p.o Torsemide. by the Cardiology team yesterday.  Acute Kidney Injury superimposed on CKD stage IIIb, improving Metabolic Alkalosis -Renal function initiated worsened off of diuretics but now is on a Lasix drip and his BUN/creatinine went from 86/2.74 is now 43/1.43 -Was on a Lasix drip to po Torsemide cardiology has added spironolactone today -Likely is metabolic alkalosis with a CO2 of 35 is in the setting of his diuresis -Avoid further nephrotoxic medications, contrast dyes, hypotension and renally adjust medication -Continue to monitor and trend renal function -Repeat CMP in a.m.  Paroxysmal Atrial Fibrillation -Has had atrial fibrillation with RVR -He was started on IV amiodarone and has converted to normal sinus rhythm -Remained on amiodarone drip at 30 mg/h per Cardiology but since Milrinone is stopping patient is going to be changed to Amiodarone 200 mg po BID and on 10 days and then decreasing to 200 mg  daily -Continue with anticoagulation with apixaban 2.5 mg p.o. twice daily given his renal function as well as age -Continue with telemetry monitoring  Acute E. coli UTI with Hematuria, improving -Initial urinalysis could not test for nitrites or leukocytes given that it was blood but did show many bacteria and greater than 50 RBCs per high-power field as well as greater than 50 WBCs  -Urine Cx showed pansensative E Coli -Initially was on IV Ceftriaxone and was changed to Augmentin 500 BID to complete 7 abx tx -Continue to monitor carefully and Urine is clearing   -Antibiotics have now been stopped  Hypothyroidism -No TSH done when he was Admitted  -Continue with Levothyroxine 125 mcg p.o. daily before breakfast  Constipation -Adjusted his bowel regimen and stopped his docusate 100 mg p.o. twice daily and change to senna docusate 1 tab p.o. twice daily along with MiraLAX 17 g p.o. twice daily and he currently also has suppositories with bisacodyl 10 mg daily RC as needed -Patient has not had a bowel movement yet so I have given him a scheduled suppository if he still does not have a bowel movement will given an enema  CAD -Underwent PCI stenting and 2019 -Denies any current chest pain -Continue with Isosorbide mononitrate 30 mg p.o. daily and with Rosuvastatin 10 mg  p.o. daily -Currently off of Plavix and continuing anticoagulation with Eliquis for his atrial fibrillation -Cardiology still holding his beta-blocker for now  Hyperlipidemia -Continue with Rosuvastatin 10 mg p.o. daily  BPH/history of Nephrolithiasis -Continue with Finasteride 5 mg po Daily  -Continue with Tamsulosin 0.4 mg p.o. nightly  Diabetes mellitus type 2, on oral agents only. -Hemoglobin A1c 5.7. -Continue with moderate NovoLog/scale insulin before meals and at bedtime -CBGs have been relatively well controlled and ranged from 112-153; blood sugar on this a.m. his BMP is 188 -Continue to monitor and further  adjust insulin as necessary  NormocyticAnemia, fecal occult blood positive -Patient is hemoglobin/hematocrit has been relatively stable last 9 days or so and today was 9.3/30.0 -Anemia panel was done and showed a iron level of 31, U IBC of 395, TIBC of 426, saturation ratios of 7%, ferritin of 34, folate of 3.8, and vitamin B12 level of 2064 -Was seen by Gastroenterology with recommendation for outpatient follow-up. -He was [facility] with [facility].  -Patient will follow-up with [facility] as an outpatient.  Essential Hypertension -Antihypertensives held per cardiology continue to hold his carvedilol and hydralazine/Imdur -BP was improved and was 108/65 this AM  -Continue to monitor blood pressures per protocol  Obesity -Estimated body mass index is 33.67 kg/m as calculated from the following:   Height as of this encounter: 5\' 7"  (1.702 m).   Weight as of this encounter: 97.5 kg. -Weight Loss and Dietary Counseling given   DVT prophylaxis: Anticoagulated with Apixaban 2.5 mg po BID  Macdonald Status: FULL Macdonald  Family Communication: No family present at bedside  Disposition Plan: Patient is from home and he came in significantly volume overloaded due to missed diuretics.  Will need to be adequately diuresed back to dry weight per cardiology and off of milrinone and Lasix drip prior to safe discharge.  Currently he is being weaned off of the milrinone and Lasix drip and off of the amiodarone drip and going to be changed to p.o. amiodarone as well as p.o. torsemide.  He will also need PT OT prior to discharge given his prolonged hospitalization and they are recommending home health currently  Consultants:   Cardiology Advanced Heart Failure Team   Gastroenterology  Procedures:  Right Heart Catheterization 02/19/20 1. Markedly elevated right and left heart filling pressures.  2. Preserved cardiac output.  3. Pulmonary venous hypertension.    Antimicrobials:  Anti-infectives  (From admission, onward)   Start     Dose/Rate Route Frequency Ordered Stop   02/18/20 0845  amoxicillin-clavulanate (AUGMENTIN) 500-125 MG per tablet 500 mg  Status:  Discontinued     1 tablet Oral Every 12 hours 02/18/20 0831 02/23/20 1424   02/16/20 1130  cefTRIAXone (ROCEPHIN) 1 g in sodium chloride 0.9 % 100 mL IVPB  Status:  Discontinued     1 g 200 mL/hr over 30 Minutes Intravenous Every 24 hours 02/16/20 1025 02/18/20 0831     Subjective: Seen and examined at bedside and he states that he is doing okay and denied any chest pain or shortness of breath and states that he wears 3 L of oxygen at home most of the time.  His main complaint was that he still has not had a bowel movement and he states that it makes him uncomfortable when he does not have a regular bowel movement.  Denies any lightheadedness or dizziness.  No other concerns or complaints at this time and has been peeing out significantly still.  Objective:  Vitals:   02/23/20 0619 02/23/20 0620 02/23/20 0800 02/23/20 1500  BP:    108/65  Pulse: 77 79  74  Resp: 20 20  (!) 22  Temp: 98 F (36.7 C)  98 F (36.7 C) 98.4 F (36.9 C)  TempSrc: Oral   Oral  SpO2: 97% 97%  99%  Weight:  97.5 kg    Height:        Intake/Output Summary (Last 24 hours) at 02/23/2020 1740 Last data filed at 02/23/2020 1242 Gross per 24 hour  Intake 600 ml  Output 1501 ml  Net -901 ml   Filed Weights   02/21/20 0549 02/22/20 0500 02/23/20 0620  Weight: 99 kg 99.4 kg 97.5 kg   Examination: Physical Exam:  Constitutional: WN/WD obese male currently in no acute distress sitting in the chair bedside and complaining of some constipation Eyes: Lids and conjunctivae normal, sclerae anicteric  ENMT: External Ears, Nose appear normal. Grossly normal hearing. Neck: Appears normal, supple, no cervical masses, normal ROM, no appreciable thyromegaly: No JVD Respiratory: Diminished to auscultation bilaterally with coarse breath sounds, no wheezing,  rales, rhonchi or crackles. Normal respiratory effort and patient is not tachypenic. No accessory muscle use.  Patient has unlabored breathing and he is wearing 3 L of supplemental oxygen via nasal cannula Cardiovascular: RRR, no murmurs / rubs / gallops. S1 and S2 auscultated.  1+ extremity edema and his legs are wrapped in Unna boots Abdomen: Soft, non-tender, distended secondary body habitus. Bowel sounds positive.  GU: Deferred. Musculoskeletal: No clubbing / cyanosis of digits/nails. No joint deformity upper and lower extremities but he does have a right upper extremity PICC line Skin: No rashes, lesions, ulcers on limited skin evaluation but both legs legs are wrapped in Unna boots. No induration; Warm and dry.  Neurologic: CN 2-12 grossly intact with no focal deficits. Romberg sign and cerebellar reflexes not assessed.  Psychiatric: Normal judgment and insight. Alert and oriented x 3. Normal mood and appropriate affect.   Data Reviewed: I have personally reviewed following labs and imaging studies  CBC: Recent Labs  Lab 02/19/20 0435 02/19/20 0435 02/19/20 1306 02/20/20 0409 02/21/20 0342 02/22/20 0524 02/23/20 0530  WBC 6.4  --   --  6.1 6.1 6.3 6.3  NEUTROABS 4.1  --   --  4.0 4.1 4.1 4.1  HGB 8.5*   < > 9.9*  9.5* 8.4* 8.3* 8.5* 9.3*  HCT 27.3*   < > 29.0*  28.0* 26.6* 27.3* 27.6* 30.0*  MCV 93.5  --   --  97.4 97.2 96.8 95.8  PLT 215  --   --  172 166 158 172   < > = values in this interval not displayed.   Basic Metabolic Panel: Recent Labs  Lab 02/19/20 0435 02/19/20 0435 02/19/20 1306 02/20/20 0409 02/21/20 0342 02/22/20 0524 02/23/20 0530  NA 136   < > 140  141 136 138 138 139  K 4.5   < > 4.7  4.5 4.3 3.9 4.2 4.1  CL 103  --   --  99 99 92* 94*  CO2 24  --   --  28 31 33* 35*  GLUCOSE 118*  --   --  196* 174* 188* 111*  BUN 79*  --   --  65* 60* 49* 43*  CREATININE 2.23*  --   --  1.95* 1.70* 1.54* 1.43*  CALCIUM 8.8*  --   --  8.8* 8.6* 8.8* 9.1  MG   --   --   --  2.3 1.9 1.9 1.7  PHOS  --   --   --   --  3.1 2.7 2.6   < > = values in this interval not displayed.   GFR: Estimated Creatinine Clearance: 43.6 mL/min (A) (by C-G formula based on SCr of 1.43 mg/dL (H)). Liver Function Tests: Recent Labs  Lab 02/21/20 0342 02/22/20 0524 02/23/20 0530  AST 32 34 34  ALT ALKPHOS 57 54 57  BILITOT 0.8 0.9 1.2  PROT 6.9 6.9 7.3  ALBUMIN 2.5* 2.4* 2.5*   No results for input(s): LIPASE, AMYLASE in the last 168 hours. No results for input(s): AMMONIA in the last 168 hours. Coagulation Profile: No results for input(s): INR, PROTIME in the last 168 hours. Cardiac Enzymes: No results for input(s): CKTOTAL, CKMB, CKMBINDEX, TROPONINI in the last 168 hours. BNP (last 3 results) No results for input(s): PROBNP in the last 8760 hours. HbA1C: No results for input(s): HGBA1C in the last 72 hours. CBG: Recent Labs  Lab 02/22/20 1639 02/22/20 2232 02/23/20 0614 02/23/20 1116 02/23/20 1618  GLUCAP 132* 142* 128* 178* 194*   Lipid Profile: No results for input(s): CHOL, HDL, LDLCALC, TRIG, CHOLHDL, LDLDIRECT in the last 72 hours. Thyroid Function Tests: No results for input(s): TSH, T4TOTAL, FREET4, T3FREE, THYROIDAB in the last 72 hours. Anemia Panel: No results for input(s): VITAMINB12, FOLATE, FERRITIN, TIBC, IRON, RETICCTPCT in the last 72 hours. Sepsis Labs: No results for input(s): PROCALCITON, LATICACIDVEN in the last 168 hours.  Recent Results (from the past 240 hour(s))  Culture, Urine     Status: Abnormal   Collection Time: 02/15/20 11:39 AM   Specimen: Urine, Clean Catch  Result Value Ref Range Status   Specimen Description URINE, CLEAN CATCH  Final   Special Requests   Final    NONE Performed at [facility], [address] [place], [place] [address]    Culture >=100,000 COLONIES/mL ESCHERICHIA COLI (A)  Final   Report Status 02/17/2020 FINAL  Final   Organism ID, Bacteria ESCHERICHIA COLI (A)   Final      Susceptibility   Escherichia coli - MIC*    AMPICILLIN <=2 SENSITIVE Sensitive     CEFAZOLIN <=4 SENSITIVE Sensitive     CEFTRIAXONE <=0.25 SENSITIVE Sensitive     CIPROFLOXACIN <=0.25 SENSITIVE Sensitive     GENTAMICIN <=1 SENSITIVE Sensitive     IMIPENEM <=0.25 SENSITIVE Sensitive     NITROFURANTOIN <=16 SENSITIVE Sensitive     TRIMETH/SULFA <=20 SENSITIVE Sensitive     AMPICILLIN/SULBACTAM <=2 SENSITIVE Sensitive     PIP/TAZO <=4 SENSITIVE Sensitive     * >=100,000 COLONIES/mL ESCHERICHIA COLI  Surgical pcr screen     Status: Abnormal   Collection Time: 02/19/20  3:51 AM   Specimen: Nasal Mucosa; Nasal Swab  Result Value Ref Range Status   MRSA, PCR POSITIVE (A) NEGATIVE Final    Comment: RESULT CALLED TO, READ BACK BY AND VERIFIED WITH: [name] RN 02/19/20 0510 JDW    Staphylococcus aureus POSITIVE (A) NEGATIVE Final    Comment: (NOTE) The Xpert SA Assay (FDA approved for NASAL specimens in patients 84 years of age and older), is one component of a comprehensive surveillance program. It is not intended to diagnose infection nor to guide or monitor treatment. Performed at [facility], [address] [place], [place] [address]      RN Pressure Injury Documentation:     Estimated body mass index is 33.67 kg/m  as calculated from the following:   Height as of this encounter: 5\' 7"  (1.702 m).   Weight as of this encounter: 97.5 kg.  Malnutrition Type:      Malnutrition Characteristics:      Nutrition Interventions:    Radiology Studies: DG CHEST PORT 1 VIEW  Result Date: 02/22/2020 CLINICAL DATA:  Shortness of breath. Fluid overload. History of CHF. EXAM: PORTABLE CHEST 1 VIEW COMPARISON:  02/21/2020. FINDINGS: Right PICC line noted with tip over cavoatrial junction. Cardiac pacer in stable position. Stable cardiomegaly. Pulmonary venous congestion. Mild bilateral interstitial prominence suggesting CHF noted on today's exam. Bibasilar  atelectasis/infiltrates. Moderate right-sided pleural effusion, increased from prior exam. No pneumothorax. IMPRESSION: 1.  Right PICC line noted with tip over SVC. 2. Cardiac pacer stable position. Stable cardiomegaly. Pulmonary venous congestion. Mild bilateral interstitial prominence suggesting CHF noted on today's exam. Moderate right-sided pleural effusion, increased from prior exam. 3.  Bibasilar atelectasis/infiltrates. Electronically Signed   By: 02/23/2020  Register   On: 02/22/2020 06:25   Scheduled Meds: . allopurinol  200 mg Oral Daily  . amiodarone  200 mg Oral BID  . apixaban  2.5 mg Oral BID  . bisacodyl  10 mg Rectal Once  . Chlorhexidine Gluconate Cloth  6 each Topical Daily  . diclofenac Sodium  4 g Topical QID  . finasteride  5 mg Oral Daily  . hydrALAZINE  12.5 mg Oral Q8H  . insulin aspart  0-15 Units Subcutaneous TID WC  . insulin aspart  0-5 Units Subcutaneous QHS  . isosorbide mononitrate  30 mg Oral Daily  . levothyroxine  125 mcg Oral QAC breakfast  . magnesium oxide  400 mg Oral Daily  . mupirocin ointment  1 application Nasal BID  . polyethylene glycol  17 g Oral BID  . rosuvastatin  10 mg Oral q1800  . senna-docusate  1 tablet Oral BID  . sertraline  25 mg Oral Daily  . sodium chloride flush  3 mL Intravenous Q12H  . sodium chloride flush  3 mL Intravenous Q12H  . spironolactone  12.5 mg Oral Daily  . tamsulosin  0.4 mg Oral QHS  . torsemide  40 mg Oral q1800  . torsemide  80 mg Oral QAC breakfast  . cyanocobalamin  1,000 mcg Oral Daily   Continuous Infusions: . sodium chloride      LOS: 11 days   02/24/2020, DO [facility] PAGER is on AMION  If 7PM-7AM, please contact night-coverage [email or web address]

## 2020-02-23 NOTE — Progress Notes (Signed)
MEWS score 2 due to respiratory rate. Assessed patient to find him in no distress with a respiratory rate of 19.

## 2020-02-24 ENCOUNTER — Inpatient Hospital Stay (HOSPITAL_COMMUNITY): Payer: No Typology Code available for payment source

## 2020-02-24 DIAGNOSIS — J9621 Acute and chronic respiratory failure with hypoxia: Secondary | ICD-10-CM

## 2020-02-24 LAB — CBC WITH DIFFERENTIAL/PLATELET
Abs Immature Granulocytes: 0.02 10*3/uL (ref 0.00–0.07)
Basophils Absolute: 0 10*3/uL (ref 0.0–0.1)
Basophils Relative: 0 %
Eosinophils Absolute: 0.3 10*3/uL (ref 0.0–0.5)
Eosinophils Relative: 4 %
HCT: 30.9 % — ABNORMAL LOW (ref 39.0–52.0)
Hemoglobin: 9.5 g/dL — ABNORMAL LOW (ref 13.0–17.0)
Immature Granulocytes: 0 %
Lymphocytes Relative: 15 %
Lymphs Abs: 1 10*3/uL (ref 0.7–4.0)
MCH: 29.6 pg (ref 26.0–34.0)
MCHC: 30.7 g/dL (ref 30.0–36.0)
MCV: 96.3 fL (ref 80.0–100.0)
Monocytes Absolute: 1 10*3/uL (ref 0.1–1.0)
Monocytes Relative: 15 %
Neutro Abs: 4.2 10*3/uL (ref 1.7–7.7)
Neutrophils Relative %: 66 %
Platelets: 174 10*3/uL (ref 150–400)
RBC: 3.21 MIL/uL — ABNORMAL LOW (ref 4.22–5.81)
RDW: 19.2 % — ABNORMAL HIGH (ref 11.5–15.5)
WBC: 6.5 10*3/uL (ref 4.0–10.5)
nRBC: 0 % (ref 0.0–0.2)

## 2020-02-24 LAB — COMPREHENSIVE METABOLIC PANEL
ALT: 22 U/L (ref 0–44)
AST: 37 U/L (ref 15–41)
Albumin: 2.5 g/dL — ABNORMAL LOW (ref 3.5–5.0)
Alkaline Phosphatase: 58 U/L (ref 38–126)
Anion gap: 10 (ref 5–15)
BUN: 39 mg/dL — ABNORMAL HIGH (ref 8–23)
CO2: 35 mmol/L — ABNORMAL HIGH (ref 22–32)
Calcium: 9 mg/dL (ref 8.9–10.3)
Chloride: 94 mmol/L — ABNORMAL LOW (ref 98–111)
Creatinine, Ser: 1.33 mg/dL — ABNORMAL HIGH (ref 0.61–1.24)
GFR calc Af Amer: 57 mL/min — ABNORMAL LOW (ref >60)
GFR calc non Af Amer: 49 mL/min — ABNORMAL LOW (ref >60)
Glucose, Bld: 105 mg/dL — ABNORMAL HIGH (ref 70–99)
Potassium: 4 mmol/L (ref 3.5–5.1)
Sodium: 139 mmol/L (ref 135–145)
Total Bilirubin: 1 mg/dL (ref 0.3–1.2)
Total Protein: 7.4 g/dL (ref 6.5–8.1)

## 2020-02-24 LAB — COOXEMETRY PANEL
Carboxyhemoglobin: 2.1 % — ABNORMAL HIGH (ref 0.5–1.5)
Methemoglobin: 1.2 % (ref 0.0–1.5)
O2 Saturation: 66.3 %
Total hemoglobin: 10.1 g/dL — ABNORMAL LOW (ref 12.0–16.0)

## 2020-02-24 LAB — GLUCOSE, CAPILLARY
Glucose-Capillary: 143 mg/dL — ABNORMAL HIGH (ref 70–99)
Glucose-Capillary: 111 mg/dL — ABNORMAL HIGH (ref 70–99)
Glucose-Capillary: 172 mg/dL — ABNORMAL HIGH (ref 70–99)

## 2020-02-24 LAB — PHOSPHORUS: Phosphorus: 2.5 mg/dL (ref 2.5–4.6)

## 2020-02-24 LAB — MAGNESIUM: Magnesium: 1.7 mg/dL (ref 1.7–2.4)

## 2020-02-24 MED ORDER — ISOSORBIDE MONONITRATE ER 30 MG PO TB24: 30.0000 mg | ORAL_TABLET | Freq: Every day | ORAL | 0 refills | Status: DC

## 2020-02-24 MED ORDER — SPIRONOLACTONE 25 MG PO TABS: 12.5000 mg | ORAL_TABLET | Freq: Every day | ORAL | 0 refills | Status: DC

## 2020-02-24 MED ORDER — APIXABAN 5 MG PO TABS: 5.0000 mg | ORAL_TABLET | Freq: Two times a day (BID) | ORAL | Status: DC

## 2020-02-24 MED ORDER — APIXABAN 5 MG PO TABS: 5.0000 mg | ORAL_TABLET | Freq: Two times a day (BID) | ORAL | 0 refills | Status: DC

## 2020-02-24 MED ORDER — HYDRALAZINE HCL 25 MG PO TABS: 12.5000 mg | ORAL_TABLET | Freq: Three times a day (TID) | ORAL | 0 refills | Status: DC

## 2020-02-24 MED ORDER — BISACODYL 10 MG RE SUPP: 10.0000 mg | Freq: Every day | RECTAL | 0 refills | Status: DC | PRN

## 2020-02-24 MED ORDER — DICLOFENAC SODIUM 1 % EX GEL: 4.0000 g | Freq: Four times a day (QID) | CUTANEOUS | 0 refills | Status: DC

## 2020-02-24 MED ORDER — POLYETHYLENE GLYCOL 3350 17 G PO PACK: 17.0000 g | PACK | Freq: Two times a day (BID) | ORAL | 0 refills | Status: AC

## 2020-02-24 MED ORDER — TORSEMIDE 20 MG PO TABS: ORAL_TABLET | ORAL | 0 refills | Status: DC

## 2020-02-24 MED ORDER — AMIODARONE HCL 200 MG PO TABS: ORAL_TABLET | ORAL | 0 refills | Status: DC

## 2020-02-24 MED ORDER — SENNOSIDES-DOCUSATE SODIUM 8.6-50 MG PO TABS: 1.0000 | ORAL_TABLET | Freq: Two times a day (BID) | ORAL | 0 refills | Status: DC

## 2020-02-24 NOTE — Plan of Care (Signed)

## 2020-02-24 NOTE — Progress Notes (Signed)
Patient ID: Ryan Macdonald, male   DOB: 02-06-1934, 86 y.o.   MRN: [id number]     Advanced Heart Failure Rounding Note  PCP-Cardiologist: Skeet Latch, MD   Subjective:    Creatinine continues to trend down 2.86>2.7>2.23>1.95>1.7 >1.5>1.4>1.3  Reports dyspnea is significantly improved   RHC Procedural Findings (3/23): Hemodynamics (mmHg) RA mean 23 RV 74/27 PA 75/30, mean 49 PCWP mean 39 Oxygen saturations: PA 68% AO 98% Cardiac Output (Fick) 8.22  Cardiac Index (Fick) 3.84 PVR 1.2 WU    Objective:   Weight Range: 97.5 kg Body mass index is 33.67 kg/m.   Vital Signs:   Temp:  [97.6 F (36.4 C)-98.4 F (36.9 C)] 98 F (36.7 C) (03/28 0816) Pulse Rate:  [74-81] 77 (03/28 1100) Resp:  [15-30] 20 (03/28 1100) BP: (108-117)/(63-69) 112/65 (03/28 1100) SpO2:  [98 %-100 %] 98 % (03/28 1100) Last BM Date: 02/20/20  Weight change: Filed Weights   02/21/20 0549 02/22/20 0500 02/23/20 0620  Weight: 99 kg 99.4 kg 97.5 kg    Intake/Output:   Intake/Output Summary (Last 24 hours) at 02/24/2020 1316 Last data filed at 02/24/2020 0900 Gross per 24 hour  Intake 246 ml  Output 1350 ml  Net -1104 ml      Physical Exam    General:  No resp difficulty HEENT: normal Neck: supple. No JVD. Cor: Regular rate & rhythm. No rubs, gallops or murmurs. Lungs: clear Abdomen: soft, nontender, nondistended. No hepatosplenomegaly. No bruits or masses. Good bowel sounds. Extremities: no cyanosis, clubbing, rash, R and LLE unna boots.    Telemetry   NSR 70-80ss personally reviewed.   Labs    CBC Recent Labs    02/23/20 0530 02/24/20 0625  WBC 6.3 6.5  NEUTROABS 4.1 4.2  HGB 9.3* 9.5*  HCT 30.0* 30.9*  MCV 95.8 96.3  PLT 172 381   Basic Metabolic Panel Recent Labs    02/23/20 0530 02/24/20 0625  NA 139 139  K 4.1 4.0  CL 94* 94*  CO2 35* 35*  GLUCOSE 111* 105*  BUN 43* 39*  CREATININE 1.43* 1.33*  CALCIUM 9.1 9.0  MG 1.7 1.7  PHOS 2.6 2.5   Liver  Function Tests Recent Labs    02/23/20 0530 02/24/20 0625  AST 34 37  ALT 20 22  ALKPHOS 57 58  BILITOT 1.2 1.0  PROT 7.3 7.4  ALBUMIN 2.5* 2.5*   No results for input(s): LIPASE, AMYLASE in the last 72 hours. Cardiac Enzymes No results for input(s): CKTOTAL, CKMB, CKMBINDEX, TROPONINI in the last 72 hours.  BNP: BNP (last 3 results) Recent Labs    02/11/20 1739  BNP 1,569.0*    ProBNP (last 3 results) No results for input(s): PROBNP in the last 8760 hours.   D-Dimer No results for input(s): DDIMER in the last 72 hours. Hemoglobin A1C No results for input(s): HGBA1C in the last 72 hours. Fasting Lipid Panel No results for input(s): CHOL, HDL, LDLCALC, TRIG, CHOLHDL, LDLDIRECT in the last 72 hours. Thyroid Function Tests No results for input(s): TSH, T4TOTAL, T3FREE, THYROIDAB in the last 72 hours.  Invalid input(s): FREET3  Other results:   Imaging    DG CHEST PORT 1 VIEW  Result Date: 02/24/2020 CLINICAL DATA:  Congestive heart failure. EXAM: PORTABLE CHEST 1 VIEW COMPARISON:  Chest radiograph 02/22/2020 FINDINGS: Right upper extremity PICC line tip projects over the right atrium. Single lead AICD device overlies the left hemithorax. Stable cardiomegaly. Low lung volumes. Persistent small to moderate right pleural effusion.  Bilateral interstitial pulmonary opacities. No pneumothorax. IMPRESSION: 1. Cardiomegaly and interstitial pulmonary edema. 2. Small to moderate right pleural effusion. Electronically Signed   By: Annia Belt M.D.   On: 02/24/2020 09:25     Medications:     Scheduled Medications: . allopurinol  200 mg Oral Daily  . amiodarone  200 mg Oral BID  . apixaban  2.5 mg Oral BID  . bisacodyl  10 mg Rectal Once  . Chlorhexidine Gluconate Cloth  6 each Topical Daily  . diclofenac Sodium  4 g Topical QID  . finasteride  5 mg Oral Daily  . hydrALAZINE  12.5 mg Oral Q8H  . insulin aspart  0-15 Units Subcutaneous TID WC  . insulin aspart  0-5  Units Subcutaneous QHS  . isosorbide mononitrate  30 mg Oral Daily  . levothyroxine  125 mcg Oral QAC breakfast  . magnesium oxide  400 mg Oral Daily  . polyethylene glycol  17 g Oral BID  . rosuvastatin  10 mg Oral q1800  . senna-docusate  1 tablet Oral BID  . sertraline  25 mg Oral Daily  . sodium chloride flush  3 mL Intravenous Q12H  . sodium chloride flush  3 mL Intravenous Q12H  . spironolactone  12.5 mg Oral Daily  . tamsulosin  0.4 mg Oral QHS  . torsemide  40 mg Oral q1800  . torsemide  80 mg Oral QAC breakfast  . cyanocobalamin  1,000 mcg Oral Daily    Infusions: . sodium chloride      PRN Medications: sodium chloride, acetaminophen, bisacodyl, guaiFENesin-codeine, HYDROcodone-acetaminophen, ipratropium-albuterol, methocarbamol, ondansetron (ZOFRAN) IV, sodium chloride flush, sodium chloride flush   Assessment/Plan   1. Acute on chronic systolic CHF: Primarily nonischemic cardiomyopathy. EF has been low since at least 2014.  He has single chamber [employer] ICD. QRS not wide enough for CRT.Settings adjusted in the past to prevent RV pacing in the face of a long 1st degree AV block.  TEE in 2/21 with EF 20-25%, diffuse hypokinesis and mild LVH, mild RV systolic dysfunction. He was admitted after taking the wrong meds (no diuretic) for a number of days and developing volume overload.  He has converted to NSR on amiodarone.  Hydralazine and Coreg stopped with soft BP, SBP now 110s-120s.  Diuretics held with AKI for several days.  Creatinine improving.  RHC 3/23 showed markedly elevated right and left-sided filling pressures with pulmonary venous hypertension and preserved cardiac output.  He was started on milrinone 0.125 and Lasix gtt.   - Stoped lasix drip 3/26 and started torsemide 80 mg in am and 40 mg in pm. (home dose torsemide 60 daily)  - Off milrinone, Co-ox 74% - Will leave off Coreg for now, can restart as outpatient - Continue hydralazine 12.5 mg three times a day  and continue imdur 30 mg daily  - Continue spironolactone 12.5 mg daily - Renal function continue to improve  2. CAD: S/p PCI 07/2018 to pLAD but CAD does not explain his cardiomyopathy.   - No chest pain.  - He is off Plavix now, on Eliquis for his atrial fibrillation.  - Continue Crestor  3.Atrial fibrillation: He had been in atrial fibrillation initially this admission but now back in NSR on amiodarone.   - Eliquis held for hematuria but later restarted.  Will increase dose to 5 mg twice daily given improvement in his renal function - Continue amio 200 mg po twice a day, plan for 10 days then decrease to 200 mg  daily - He has history of hypothyroidism with amiodarone in the past but is on Levoxyl.   4. AKI on CKD stage 3: Creatinine down to 1.3  5. Hypothyroidism: Suspect this is due to amiodarone.  He has been on Levoxyl.   6. Anemia: Fe deficiency with FOBT+.  He denies overt bleeding.  He has had feraheme.  GI saw him and plan outpatient workup.  - Hgb stable 8.5    7. ID: E coli UTI.  - on Augmentin.   [facility] will sign off.   Medication Recommendations:  Amiodarone 200 mg BID thru 03/02/20 then decrease to 200 mg daily, apixaban 5 mg BID (given improvement in renal function, dose should now be 5mg  BID), hydralazine 12.5 mg TID, imdur 30 mg daily, rosuvastatin 10 mg daily, spironolactone 12.5 mg daily, torsemide 80mg  qam/40mg  qhs Other recommendations (labs, testing, etc):  BMET within 1 week Follow up as an outpatient:  Follow-up in advanced heart failure clinic scheduled for 4/13   NP-C  02/24/2020 1:16 PM

## 2020-02-24 NOTE — Progress Notes (Signed)
PIV removed from patient without diffulculty. His picc line was removed as well with no complications. Tele discontinued and patient taken out in wheelchair to car for home.

## 2020-02-24 NOTE — Discharge Summary (Signed)
Physician Discharge Summary  Ryan Macdonald PYP:[id number] DOB: [birth year] DOA: 02/11/2020  PCP: [facility]  Admit date: 02/11/2020 Discharge date: 02/24/2020  Admitted From: Home Disposition: Home with Home Health   Recommendations for Outpatient Follow-up:  1. Follow up with PCP in 1-2 weeks 2. Follow up with Cardiology on 4/13 3. Follow up with Gastroenterology within 1-2 weeks 4. Please obtain CMP/CBC, Mag, Phos in one week 5. Please follow up on the following pending results:  Home Health: Yes  Equipment/Devices: None  Discharge Condition: Stable CODE STATUS: FULL CODE  Diet recommendation: Heart Healthy Diet with 1200 mL Fluid Restriction  Brief/Interim Summary: The patient is a 86 year old male with a past medical history significant for nonischemic cardiomyopathy with an EF of 20 to 25% on TEE last month, status post AICD placement, history of PAF on Eliquis who had DCCV last month as well history of PE diabetes mellitus type 2 and other comorbidities who presented with worsening swelling and lower extremity edema and increased cough and productive sputum.  Weight was up 30 pounds.  He was found to have acute on chronic systolic CHF and was in A. fib with RVR.  Currently he is being followed by the advanced heart failure team and had been treated with diuresis as well as rhythm management with amiodarone but his diuresis has been held for several days due to an AKI.  Because of his volume overload he required close monitoring and he underwent a right heart cath to assess his hemodynamics.  The right heart cath on 02/19/2020 showed markedly elevated right and left-sided filling pressures with pulmonary venous hypertension and preserved cardiac output.  Cardiology started the patient on milrinone and Lasix drip currently is being diuresed by them and is improving and weight is coming down and he is having good output with improving Renal Fxn.  Cardiology stopped his Milrinone  and changing his Lasix drip to Torsemide 80 mg in a.m. and 40 mg in the p.m as he improved..Currently is off of Plavix for CAD and currently on Eliquis for his atrial fibrillation.  Stopped his antibiotics now.  Cardiology has now added spironolactone 12.5 mg p.o. daily and continue to hold his Coreg.  Patient improved back to baseline and cardiology felt that he is stable from their perspective to be discharged.  Will need to follow-up with PCP and cardiology in 1 to 2 weeks as well as follow-up with gastroenterology in outpatient setting as he is stable for discharge at this time  Discharge Diagnoses:  Principal Problem:   Acute on chronic heart failure (HCC) Active Problems:   Obesity (BMI 30-39.9)   Essential hypertension   Atrial fibrillation (HCC)   Chronic renal insufficiency, stage 3 (moderate) (HCC)   Diabetes mellitus type 2 in obese (HCC)   Morbid obesity (HCC)   Constipation   Anemia   Hematuria   CKD (chronic kidney disease), stage III  Acute on chronic systolic CHF primarily Nonischemic Cardiomyopathy ?Cardiorenal syndrome Acute on chronic respiratory failure with hypoxia in the setting of above; patient wears a baseline of 3 L of supplemental oxygen via nasal cannula at home and this is confirmed today -Continues to have some edema but this is improving somewhat  -Has had a known low EF since 2014 and has a single-chamber [employer] ICD -Underwent a TEE in February 2021 which showed an EF of 20 to 25% with diffuse hypokinesis mild LVH as well as mild RV systolic dysfunction -Likely his symptoms developed in the setting  of noncompliance after he is taken the wrong medication for a number of days; i.e. no diuretic -Presented with A. fib with RVR now is in normal sinus rhythm after IV amiodarone -Cardiology was consulted and they have held his Hydralazine and Coreg due to soft blood pressures but his blood pressures are now 110s to 120s; Last BP was 108/65 -Cardiology still  holding off of Carvedilol. he is started Imdur 30 mg p.o. daily and also added hydralazine 12.5 mg 3 times daily -They have added spironolactone 12.5 mg p.o. daily today -Initially with diuretics were also held due to AKI for several days -Patient underwent a right heart catheterization which showed markedly elevated right and left-sided filling pressures with pulmonary venous hypertension preserved cardiac output -Cardiology started the patient on a milrinone drip as well as a Lasix drip and he remains on 0.125 mcg of Milrinone and 12 mg/hr of Lasix gtt; Cardiology is to stop the Lasix drip and milrinone drip and Start Torsemide 80 mg in the a.m. and 40 mg in the p.m.; he is on diuresis -Continues to have diuresis and patient is Negative 13.0667 L since admission -Weight is down 19 pounds since his peak on 02/11/2020 he is now 214 pounds with a dry weight of 199 but he is improved -BNP on admission was 1569 -Continue strict I's and O's, daily weights as well as 2 g sodium diet -Continue to monitor his respiratory status carefully as he periodically wears oxygen at home -Patient's carboxyhemoglobin was 2.1 and methemoglobin was 1.2 -Co-Ox was 66.3% -SpO2: 98 % O2 Flow Rate (L/min): 3 L/min -Continuous pulse oximetry and maintain O2 saturations greater than 92% next-continue supplemental oxygen via nasal cannula and wean O2 as tolerated -Repeat CXR on 02/24/20 showed "Right upper extremity PICC line tip projects over the right atrium. Single lead AICD device overlies the left hemithorax. Stable cardiomegaly. Low lung volumes. Persistent small to moderate right pleural effusion. Bilateral interstitial pulmonary opacities. No pneumothorax."  -Patient's diuresis has been changed to p.o Torsemide. by the Cardiology team and he is stable to D/C home. -Follow up with Cardiology on [facility]   Acute Kidney Injury superimposed on CKD stage IIIb, improving Metabolic  Alkalosis -Renal function initiated worsened off of diuretics but now is on a Lasix drip and his BUN/creatinine went from 86/2.74 is now 39/1.33 -Was on a Lasix drip to po Torsemide cardiology has added spironolactone today -Likely is metabolic alkalosis with a CO2 of 35 is in the setting of his diuresis -Avoid further nephrotoxic medications, contrast dyes, hypotension and renally adjust medication -Continue to monitor and trend renal function -Repeat CMP within 1 week   Paroxysmal Atrial Fibrillation -Has had atrial fibrillation with RVR -He was started on IV amiodarone and has converted to normal sinus rhythm -Remained on amiodarone drip at 30 mg/h per Cardiology but since Milrinone is stopping patient is going to be changed to Amiodarone 200 mg po BID and on 10 days and then decreasing to 200 mg daily -Continue with anticoagulation with apixaban 2.5 mg p.o. twice daily given his renal function as well as age -Continue with telemetry monitoring  Acute E. coli UTI with Hematuria, improving -Initial urinalysis could not test for nitrites or leukocytes given that it was blood but did show many bacteria and greater than 50 RBCs per high-power field as well as greater than 50 WBCs  -Urine Cx showed pansensative E Coli -Initially was on IV Ceftriaxone and was changed to Augmentin 500  BID to complete 7 abx tx -Continue to monitor carefully and Urine is clearing   -Antibiotics have now been stopped  Hypothyroidism -No TSH done when he was Admitted  -Continue with Levothyroxine 125 mcg p.o. daily before breakfast  Constipation -Adjusted his bowel regimen and stopped his docusate 100 mg p.o. twice daily and change to senna docusate 1 tab p.o. twice daily along with MiraLAX 17 g p.o. twice daily and he currently also has suppositories with bisacodyl 10 mg daily RC as needed -Patient has not had a bowel movement yet so I have given him a scheduled suppository if he still does not have a bowel  movement will given an enema  CAD -Underwent PCI stenting and 2019 -Denies any current chest pain -Continue with Isosorbide mononitrate 30 mg p.o. daily and with Rosuvastatin 10 mg p.o. daily -Currently off of Plavix and continuing anticoagulation with Eliquis for his atrial fibrillation -Cardiology still holding his beta-blocker for now  Hyperlipidemia -Continue with Rosuvastatin 10 mg p.o. daily  BPH/history of Nephrolithiasis -Continue with Finasteride 5 mg po Daily  -Continue with Tamsulosin 0.4 mg p.o. nightly  Diabetes mellitus type 2, on oral agents only. -Hemoglobin A1c 5.7. -Continue with moderate NovoLog/scale insulin before meals and at bedtime while hospitalized -Resume Home Glipizide  -CBGs have been relatively well controlled and ranged from 111-194; blood sugar on this a.m. his BMP is 105 -Continue to monitor and further adjust insulin as necessary  NormocyticAnemia, fecal occult blood positive -Patient is hemoglobin/hematocrit has been relatively stable last 9 days or so and today was 9.5/30.9 -Anemia panel was done and showed a iron level of 31, U IBC of 395, TIBC of 426, saturation ratios of 7%, ferritin of 34, folate of 3.8, and vitamin B12 level of 2064 -Was seen by Gastroenterology with recommendation for outpatient follow-up. -He was [facility] with [facility] March 17.  -Patient will follow-up with [facility] as an outpatient.  Essential Hypertension -Antihypertensives held per cardiology continue to hold his carvedilol at d/C -Resumed hydralazine/Imdur and Spironolactone  -BP was improved and was 108/65 this AM  -Continue to monitor blood pressures per protocol  Obesity -Estimated body mass index is 33.67 kg/m as calculated from the following:   Height as of this encounter: 5\' 7"  (1.702 m).   Weight as of this encounter: 97.5 kg. -Weight Loss and Dietary Counseling given   Discharge Instructions  Discharge Instructions    (HEART FAILURE PATIENTS)  Call MD:  Anytime you have any of the following symptoms: 1) 3 pound weight gain in 24 hours or 5 pounds in 1 week 2) shortness of breath, with or without a dry hacking cough 3) swelling in the hands, feet or stomach 4) if you have to sleep on extra pillows at night in order to breathe.   Complete by: As directed    Call MD for:  difficulty breathing, headache or visual disturbances   Complete by: As directed    Call MD for:  extreme fatigue   Complete by: As directed    Call MD for:  hives   Complete by: As directed    Call MD for:  persistant dizziness or light-headedness   Complete by: As directed    Call MD for:  persistant nausea and vomiting   Complete by: As directed    Call MD for:  redness, tenderness, or signs of infection (pain, swelling, redness, odor or green/yellow discharge around incision site)   Complete by: As directed    Call MD  for:  severe uncontrolled pain   Complete by: As directed    Call MD for:  temperature >100.4   Complete by: As directed    Diet - low sodium heart healthy   Complete by: As directed    Discharge instructions   Complete by: As directed    You were cared for by a hospitalist during your hospital stay. If you have any questions about your discharge medications or the care you received while you were in the hospital after you are discharged, you can call the unit and ask to speak with the hospitalist on call if the hospitalist that took care of you is not available. Once you are discharged, your primary care physician will handle any further medical issues. Please note that NO REFILLS for any discharge medications will be authorized once you are discharged, as it is imperative that you return to your primary care physician (or establish a relationship with a primary care physician if you do not have one) for your aftercare needs so that they can reassess your need for medications and monitor your lab values.  Follow up with PCP, Gastroenterology, and  Cardiology in the outpatient setting. Take all medications as prescribed. If symptoms change or worsen please return to the ED for evaluation   Increase activity slowly   Complete by: As directed      Allergies as of 02/24/2020      Reactions   Lisinopril Swelling   Facial swelling   Xarelto [rivaroxaban] Other (See Comments)   "Blood came out of my penis"      Medication List    STOP taking these medications   carvedilol 3.125 MG tablet Commonly known as: Coreg   diclofenac sodium 1 % Gel Commonly known as: VOLTAREN Replaced by: diclofenac Sodium 1 % Gel   docusate sodium 100 MG capsule Commonly known as: COLACE     TAKE these medications   allopurinol 100 MG tablet Commonly known as: ZYLOPRIM Take 200 mg by mouth daily.   amiodarone 200 MG tablet Commonly known as: PACERONE Take 1 tablet (200 mg total) by mouth 2 (two) times daily for 8 days, THEN 1 tablet (200 mg total) daily. Start taking on: February 24, 2020   apixaban 5 MG Tabs tablet Commonly known as: ELIQUIS Take 1 tablet (5 mg total) by mouth 2 (two) times daily. What changed:   medication strength  how much to take   bisacodyl 10 MG suppository Commonly known as: DULCOLAX Place 1 suppository (10 mg total) rectally daily as needed for moderate constipation.   cyanocobalamin 1000 MCG tablet Take 1 tablet (1,000 mcg total) by mouth daily.   diclofenac Sodium 1 % Gel Commonly known as: VOLTAREN Apply 4 g topically 4 (four) times daily. Replaces: diclofenac sodium 1 % Gel   finasteride 5 MG tablet Commonly known as: PROSCAR Take 1 tablet (5 mg total) by mouth daily.   glipiZIDE 5 MG tablet Commonly known as: GLUCOTROL Take 5 mg by mouth daily before breakfast.   hydrALAZINE 25 MG tablet Commonly known as: APRESOLINE Take 0.5 tablets (12.5 mg total) by mouth every 8 (eight) hours. What changed:   medication strength  how much to take  when to take this   HYDROcodone-acetaminophen 5-325 MG  tablet Commonly known as: NORCO/VICODIN Take 2 tablets by mouth every 4 (four) hours as needed.   ipratropium-albuterol 0.5-2.5 (3) MG/3ML Soln Commonly known as: DUONEB Take 3 mLs by nebulization every 4 (four) hours as needed. What  changed: reasons to take this   isosorbide mononitrate 30 MG 24 hr tablet Commonly known as: IMDUR Take 1 tablet (30 mg total) by mouth daily. Start taking on: February 25, 2020 What changed:   how much to take  Another medication with the same name was removed. Continue taking this medication, and follow the directions you see here.   levothyroxine 125 MCG tablet Commonly known as: SYNTHROID Take 1 tablet (125 mcg total) by mouth daily before breakfast.   magnesium oxide 400 MG tablet Commonly known as: MAG-OX Take 400 mg by mouth daily.   methocarbamol 500 MG tablet Commonly known as: ROBAXIN Take 1 tablet (500 mg total) by mouth every 6 (six) hours as needed for muscle spasms.   polyethylene glycol 17 g packet Commonly known as: MIRALAX / GLYCOLAX Take 17 g by mouth 2 (two) times daily.   rosuvastatin 10 MG tablet Commonly known as: CRESTOR Take 1 tablet (10 mg total) by mouth daily at 6 PM.   senna-docusate 8.6-50 MG tablet Commonly known as: Senokot-S Take 1 tablet by mouth 2 (two) times daily.   sertraline 25 MG tablet Commonly known as: ZOLOFT Take 25 mg by mouth daily.   spironolactone 25 MG tablet Commonly known as: ALDACTONE Take 0.5 tablets (12.5 mg total) by mouth daily. Start taking on: February 25, 2020 What changed: how much to take   tamsulosin 0.4 MG Caps capsule Commonly known as: FLOMAX Take 0.4 mg by mouth at bedtime.   torsemide 20 MG tablet Commonly known as: DEMADEX Take 80 mg in the AM (4 tablets) and then 40 mg in the Evening (2 Tablets) Daily What changed:   how much to take  how to take this  when to take this  additional instructions      Follow-up Information    [facility] Follow up.    Specialty: Home Health Services Contact information: [address] [place] [address] [place] [address] [phone]        [facility]. Go on 03/11/2020.   Specialty: Cardiology Why: 9:00 AM, [facility], parking code [id number] Contact information: [address] [id number] Wilhemina Bonito [place] [address] [phone]         Allergies  Allergen Reactions  . Lisinopril Swelling    Facial swelling  . Xarelto [Rivaroxaban] Other (See Comments)    "Blood came out of my penis"   Consultations:  Cardiology Advanced Heart Failure Team   Gastroenterology  Procedures/Studies: DG Chest 2 View  Result Date: 02/11/2020 CLINICAL DATA:  Shortness of breath, CHF. EXAM: CHEST - 2 VIEW COMPARISON:  Chest radiograph 12/20/2019 FINDINGS: Unchanged position of a left chest single lead AICD device. Unchanged cardiomegaly. Central pulmonary vascular congestion. Right basilar opacity may reflect atelectasis, pneumonia and/or asymmetric edema. No definite pleural effusion. No evidence of pneumothorax. No acute bony abnormality. Thoracic spondylosis IMPRESSION: Unchanged cardiomegaly. Central pulmonary vascular congestion. Right basilar opacity which may reflect atelectasis, pneumonia and/or asymmetric edema. Electronically Signed   By: Jackey Loge DO   On: 02/11/2020 15:00   CARDIAC CATHETERIZATION  Result Date: 02/19/2020 1. Markedly elevated right and left heart filling pressures. 2. Preserved cardiac output. 3. Pulmonary venous hypertension.   DG CHEST PORT 1 VIEW  Result Date: 02/24/2020 CLINICAL DATA:  Congestive heart failure. EXAM: PORTABLE CHEST 1 VIEW COMPARISON:  Chest radiograph 02/22/2020 FINDINGS: Right upper extremity PICC line tip projects over the right atrium. Single lead AICD device overlies the  left hemithorax. Stable cardiomegaly. Low lung volumes. Persistent small to moderate right pleural effusion. Bilateral  interstitial pulmonary opacities. No pneumothorax. IMPRESSION: 1. Cardiomegaly and interstitial pulmonary edema. 2. Small to moderate right pleural effusion. Electronically Signed   By: Lovey Newcomer M.D.   On: 02/24/2020 09:25   DG CHEST PORT 1 VIEW  Result Date: 02/22/2020 CLINICAL DATA:  Shortness of breath. Fluid overload. History of CHF. EXAM: PORTABLE CHEST 1 VIEW COMPARISON:  02/21/2020. FINDINGS: Right PICC line noted with tip over cavoatrial junction. Cardiac pacer in stable position. Stable cardiomegaly. Pulmonary venous congestion. Mild bilateral interstitial prominence suggesting CHF noted on today's exam. Bibasilar atelectasis/infiltrates. Moderate right-sided pleural effusion, increased from prior exam. No pneumothorax. IMPRESSION: 1.  Right PICC line noted with tip over SVC. 2. Cardiac pacer stable position. Stable cardiomegaly. Pulmonary venous congestion. Mild bilateral interstitial prominence suggesting CHF noted on today's exam. Moderate right-sided pleural effusion, increased from prior exam. 3.  Bibasilar atelectasis/infiltrates. Electronically Signed   By: Marcello Moores  Register   On: 02/22/2020 06:25   [place] EKG SITE RITE  Result Date: 02/19/2020 If Site Rite image not attached, placement could not be confirmed due to current cardiac rhythm.   Right Heart Catheterization 02/19/20 1.Markedly elevated right and left heart filling pressures.  2. Preserved cardiac output.  3. Pulmonary venous hypertension.   Subjective: Examined at bedside and he was doing well.  Cardiology felt he is is close to his baseline.  Patient is improving denies any dyspnea.  No nausea or vomiting.  Feels well.  No other concerns or complaints at this time.   Discharge Exam: Vitals:   02/24/20 0816 02/24/20 1100  BP: 111/63 112/65  Pulse: 81 77  Resp: 20 20  Temp: 98 F (36.7 C) 98.4 F (36.9 C)  SpO2: 98% 98%   Vitals:   02/24/20 0300 02/24/20 0400 02/24/20 0816 02/24/20 1100  BP:  111/64 111/63  112/65  Pulse: 75 75 81 77  Resp: (!) 21 20 20 20   Temp:   98 F (36.7 C) 98.4 F (36.9 C)  TempSrc:  Oral Oral Oral  SpO2: 98% 99% 98% 98%  Weight:      Height:       General: Pt is alert, awake, not in acute distress Cardiovascular: RRR, S1/S2 +, no rubs, no gallops Respiratory: Diminished bilaterally, no wheezing, no rhonchi; wearing 3 L of supplemental oxygen via nasal cannula Abdominal: Soft, NT, distended secondary body habitus, bowel sounds + Extremities: Bilateral lower extremity legs are wrapped in Unna boots and have edema, no cyanosis  The results of significant diagnostics from this hospitalization (including imaging, microbiology, ancillary and laboratory) are listed below for reference.    Microbiology: Recent Results (from the past 240 hour(s))  Culture, Urine     Status: Abnormal   Collection Time: 02/15/20 11:39 AM   Specimen: Urine, Clean Catch  Result Value Ref Range Status   Specimen Description URINE, CLEAN CATCH  Final   Special Requests   Final    NONE Performed at [facility], [address], [place], [place] [address]    Culture >=100,000 COLONIES/mL ESCHERICHIA COLI (A)  Final   Report Status 02/17/2020 FINAL  Final   Organism ID, Bacteria ESCHERICHIA COLI (A)  Final      Susceptibility   Escherichia coli - MIC*    AMPICILLIN <=2 SENSITIVE Sensitive     CEFAZOLIN <=4 SENSITIVE Sensitive     CEFTRIAXONE <=0.25 SENSITIVE Sensitive     CIPROFLOXACIN <=0.25  SENSITIVE Sensitive     GENTAMICIN <=1 SENSITIVE Sensitive     IMIPENEM <=0.25 SENSITIVE Sensitive     NITROFURANTOIN <=16 SENSITIVE Sensitive     TRIMETH/SULFA <=20 SENSITIVE Sensitive     AMPICILLIN/SULBACTAM <=2 SENSITIVE Sensitive     PIP/TAZO <=4 SENSITIVE Sensitive     * >=100,000 COLONIES/mL ESCHERICHIA COLI  Surgical pcr screen     Status: Abnormal   Collection Time: 02/19/20  3:51 AM   Specimen: Nasal Mucosa; Nasal Swab  Result Value Ref Range Status   MRSA, PCR POSITIVE (A)  NEGATIVE Final    Comment: RESULT CALLED TO, READ BACK BY AND VERIFIED WITH: [name] RN 02/19/20 0510 JDW    Staphylococcus aureus POSITIVE (A) NEGATIVE Final    Comment: (NOTE) The Xpert SA Assay (FDA approved for NASAL specimens in patients 56 years of age and older), is one component of a comprehensive surveillance program. It is not intended to diagnose infection nor to guide or monitor treatment. Performed at [facility], [address] [place], [place] [address]      Labs: BNP (last 3 results) Recent Labs    02/11/20 1739  BNP 1,569.0*   Basic Metabolic Panel: Recent Labs  Lab 02/20/20 0409 02/21/20 0342 02/22/20 0524 02/23/20 0530 02/24/20 0625  NA 136 138 138 139 139  K 4.3 3.9 4.2 4.1 4.0  CL 99 99 92* 94* 94*  CO2 28 31 33* 35* 35*  GLUCOSE 196* 174* 188* 111* 105*  BUN 65* 60* 49* 43* 39*  CREATININE 1.95* 1.70* 1.54* 1.43* 1.33*  CALCIUM 8.8* 8.6* 8.8* 9.1 9.0  MG 2.3 1.9 1.9 1.7 1.7  PHOS  --  3.1 2.7 2.6 2.5   Liver Function Tests: Recent Labs  Lab 02/21/20 0342 02/22/20 0524 02/23/20 0530 02/24/20 0625  AST 32 34 34 37  ALT ALKPHOS 57 54 57 58  BILITOT 0.8 0.9 1.2 1.0  PROT 6.9 6.9 7.3 7.4  ALBUMIN 2.5* 2.4* 2.5* 2.5*   No results for input(s): LIPASE, AMYLASE in the last 168 hours. No results for input(s): AMMONIA in the last 168 hours. CBC: Recent Labs  Lab 02/20/20 0409 02/21/20 0342 02/22/20 0524 02/23/20 0530 02/24/20 0625  WBC 6.1 6.1 6.3 6.3 6.5  NEUTROABS 4.0 4.1 4.1 4.1 4.2  HGB 8.4* 8.3* 8.5* 9.3* 9.5*  HCT 26.6* 27.3* 27.6* 30.0* 30.9*  MCV 97.4 97.2 96.8 95.8 96.3  PLT 172 166 158 172 174   Cardiac Enzymes: No results for input(s): CKTOTAL, CKMB, CKMBINDEX, TROPONINI in the last 168 hours. BNP: Invalid input(s): POCBNP CBG: Recent Labs  Lab 02/23/20 1618 02/23/20 2111 02/24/20 0634 02/24/20 0746 02/24/20 1112  GLUCAP 194* 171* 143* 111* 172*   D-Dimer No results for input(s): DDIMER  in the last 72 hours. Hgb A1c No results for input(s): HGBA1C in the last 72 hours. Lipid Profile No results for input(s): CHOL, HDL, LDLCALC, TRIG, CHOLHDL, LDLDIRECT in the last 72 hours. Thyroid function studies No results for input(s): TSH, T4TOTAL, T3FREE, THYROIDAB in the last 72 hours.  Invalid input(s): FREET3 Anemia work up No results for input(s): VITAMINB12, FOLATE, FERRITIN, TIBC, IRON, RETICCTPCT in the last 72 hours. Urinalysis    Component Value Date/Time   COLORURINE BROWN (A) 02/15/2020 1150   APPEARANCEUR TURBID (A) 02/15/2020 1150   LABSPEC  02/15/2020 1150    TEST NOT REPORTED DUE TO COLOR INTERFERENCE OF URINE PIGMENT   PHURINE  02/15/2020 1150  TEST NOT REPORTED DUE TO COLOR INTERFERENCE OF URINE PIGMENT   GLUCOSEU (A) 02/15/2020 1150    TEST NOT REPORTED DUE TO COLOR INTERFERENCE OF URINE PIGMENT   HGBUR (A) 02/15/2020 1150    TEST NOT REPORTED DUE TO COLOR INTERFERENCE OF URINE PIGMENT   BILIRUBINUR (A) 02/15/2020 1150    TEST NOT REPORTED DUE TO COLOR INTERFERENCE OF URINE PIGMENT   KETONESUR (A) 02/15/2020 1150    TEST NOT REPORTED DUE TO COLOR INTERFERENCE OF URINE PIGMENT   PROTEINUR (A) 02/15/2020 1150    TEST NOT REPORTED DUE TO COLOR INTERFERENCE OF URINE PIGMENT   UROBILINOGEN 0.2 05/26/2015 1521   NITRITE (A) 02/15/2020 1150    TEST NOT REPORTED DUE TO COLOR INTERFERENCE OF URINE PIGMENT   LEUKOCYTESUR (A) 02/15/2020 1150    TEST NOT REPORTED DUE TO COLOR INTERFERENCE OF URINE PIGMENT   Sepsis Labs Invalid input(s): PROCALCITONIN,  WBC,  LACTICIDVEN Microbiology Recent Results (from the past 240 hour(s))  Culture, Urine     Status: Abnormal   Collection Time: 02/15/20 11:39 AM   Specimen: Urine, Clean Catch  Result Value Ref Range Status   Specimen Description URINE, CLEAN CATCH  Final   Special Requests   Final    NONE Performed at [facility], [address] [place], [place] [address]    Culture >=100,000 COLONIES/mL  ESCHERICHIA COLI (A)  Final   Report Status 02/17/2020 FINAL  Final   Organism ID, Bacteria ESCHERICHIA COLI (A)  Final      Susceptibility   Escherichia coli - MIC*    AMPICILLIN <=2 SENSITIVE Sensitive     CEFAZOLIN <=4 SENSITIVE Sensitive     CEFTRIAXONE <=0.25 SENSITIVE Sensitive     CIPROFLOXACIN <=0.25 SENSITIVE Sensitive     GENTAMICIN <=1 SENSITIVE Sensitive     IMIPENEM <=0.25 SENSITIVE Sensitive     NITROFURANTOIN <=16 SENSITIVE Sensitive     TRIMETH/SULFA <=20 SENSITIVE Sensitive     AMPICILLIN/SULBACTAM <=2 SENSITIVE Sensitive     PIP/TAZO <=4 SENSITIVE Sensitive     * >=100,000 COLONIES/mL ESCHERICHIA COLI  Surgical pcr screen     Status: Abnormal   Collection Time: 02/19/20  3:51 AM   Specimen: Nasal Mucosa; Nasal Swab  Result Value Ref Range Status   MRSA, PCR POSITIVE (A) NEGATIVE Final    Comment: RESULT CALLED TO, READ BACK BY AND VERIFIED WITH: [name] RN 02/19/20 0510 JDW    Staphylococcus aureus POSITIVE (A) NEGATIVE Final    Comment: (NOTE) The Xpert SA Assay (FDA approved for NASAL specimens in patients 48 years of age and older), is one component of a comprehensive surveillance program. It is not intended to diagnose infection nor to guide or monitor treatment. Performed at [facility], [address], [place], [place] [address]    Time coordinating discharge: 35 minutes  SIGNED:  Merlene Laughter, DO [facility] 02/24/2020, 2:11 PM Pager is on AMION  If 7PM-7AM, please contact night-coverage [email or web address] Password [email or web address]

## 2020-02-25 ENCOUNTER — Other Ambulatory Visit (HOSPITAL_COMMUNITY): Payer: Self-pay

## 2020-02-25 NOTE — Progress Notes (Signed)
Paramedicine Encounter    Patient ID: Ryan Macdonald, male    DOB: [birth year], 86 y.o.   MRN: [id number]   Patient Care Team: Center, [facility] as PCP - General (General Practice) [name], MD as PCP - Cardiology (Cardiology) Laurey Morale, MD as PCP - Advanced Heart Failure (Cardiology)  Patient Active Problem List   Diagnosis Date Noted  . Hematuria 02/15/2020  . CKD (chronic kidney disease), stage III 02/15/2020  . Constipation 02/13/2020  . Anemia 02/13/2020  . Secondary hypercoagulable state (HCC) 01/16/2020  . AKI (acute kidney injury) (HCC) 03/14/2019  . Spasm of muscle of lower back 03/14/2019  . Acute renal failure (ARF) (HCC) 03/09/2019  . Transaminitis   . Acute on chronic renal failure (HCC) 03/08/2019  . Acute exacerbation of CHF (congestive heart failure) (HCC) 11/23/2018  . Acute on chronic heart failure (HCC) 10/19/2018  . Pulmonary emphysema (HCC)   . Chronic respiratory failure with hypoxia (HCC)   . Morbid obesity (HCC) 09/09/2018  . URI (upper respiratory infection) 08/31/2018  . UTI (urinary tract infection) 08/31/2018  . Chronic systolic (congestive) heart failure (HCC) 08/28/2018  . CHF (congestive heart failure) (HCC) 08/14/2018  . Acute on chronic combined systolic and diastolic CHF (congestive heart failure) (HCC) 07/18/2018  . First degree AV block   . Shortness of breath 04/29/2018  . Acute HFrEF (heart failure with reduced ejection fraction) (HCC) 04/29/2018  . Acute respiratory failure with hypoxia (HCC) 09/27/2017  . CHF exacerbation (HCC) 09/27/2017  . Renal insufficiency   . Acute on chronic systolic CHF (congestive heart failure) (HCC) 07/08/2017  . Left knee pain 07/08/2017  . Acute bronchitis 05/10/2017  . Acute congestive heart failure (HCC) 04/14/2017  . Elevated troponin 04/14/2017  . Lactic acidosis   . Acute on chronic systolic heart failure, NYHA class 2 (HCC) 02/03/2017  . Diabetes mellitus type 2 in obese  (HCC) 02/03/2017  . CAP (community acquired pneumonia) 02/03/2017  . Lobar pneumonia (HCC)   . Diabetes mellitus with complication (HCC)   . Acute on chronic respiratory failure with hypoxia (HCC)   . Acute on chronic combined systolic and diastolic heart failure (HCC) 05/19/2016  . Depression 05/19/2016  . Gout 05/19/2016  . 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 NSVT (nonsustained ventricular tachycardia) (HCC)   . Chronic renal insufficiency, stage 3 (moderate) (HCC)   . Hyperkalemia 08/21/2015  . Pulmonary embolism (HCC)   . Acute respiratory failure (HCC)   . Osteoarthritis of left hip 10/25/2013  . Atrial fibrillation (HCC) 10/25/2013  . S/P hip replacement 10/24/2013  . Nonischemic cardiomyopathy (HCC)   . Obesity (BMI 30-39.9)   . Essential hypertension   . Hyperlipidemia     Current Outpatient Medications:  .  allopurinol (ZYLOPRIM) 100 MG tablet, Take 200 mg by mouth daily. , Disp: , Rfl:  .  apixaban (ELIQUIS) 5 MG TABS tablet, Take 1 tablet (5 mg total) by mouth 2 (two) times daily., Disp: 60 tablet, Rfl: 0 .  cyanocobalamin 1000 MCG tablet, Take 1 tablet (1,000 mcg total) by mouth daily., Disp: 90 tablet, Rfl: 0 .  diclofenac Sodium (VOLTAREN) 1 % GEL, Apply 4 g topically 4 (four) times daily., Disp: 50 g, Rfl: 0 .  finasteride (PROSCAR) 5 MG tablet, Take 1 tablet (5 mg total) by mouth daily., Disp: 30 tablet, Rfl: 0 .  glipiZIDE (GLUCOTROL) 5 MG tablet, Take 5 mg by mouth daily before breakfast., Disp: , Rfl:  .  hydrALAZINE (APRESOLINE) 25  MG tablet, Take 0.5 tablets (12.5 mg total) by mouth every 8 (eight) hours., Disp: 90 tablet, Rfl: 0 .  ipratropium-albuterol (DUONEB) 0.5-2.5 (3) MG/3ML SOLN, Take 3 mLs by nebulization every 4 (four) hours as needed. (Patient taking differently: Take 3 mLs by nebulization every 4 (four) hours as needed (wheezing/shortness of breath). ), Disp: 360 mL, Rfl: 0 .  isosorbide mononitrate (IMDUR) 30 MG 24 hr  tablet, Take 1 tablet (30 mg total) by mouth daily., Disp: 30 tablet, Rfl: 0 .  levothyroxine (SYNTHROID) 125 MCG tablet, Take 1 tablet (125 mcg total) by mouth daily before breakfast., Disp: 90 tablet, Rfl: 1 .  magnesium oxide (MAG-OX) 400 MG tablet, Take 400 mg by mouth daily., Disp: , Rfl:  .  methocarbamol (ROBAXIN) 500 MG tablet, Take 1 tablet (500 mg total) by mouth every 6 (six) hours as needed for muscle spasms., Disp: 30 tablet, Rfl: 0 .  polyethylene glycol (MIRALAX / GLYCOLAX) 17 g packet, Take 17 g by mouth 2 (two) times daily., Disp: 14 each, Rfl: 0 .  rosuvastatin (CRESTOR) 10 MG tablet, Take 1 tablet (10 mg total) by mouth daily at 6 PM., Disp: 30 tablet, Rfl: 5 .  senna-docusate (SENOKOT-S) 8.6-50 MG tablet, Take 1 tablet by mouth 2 (two) times daily., Disp: 30 tablet, Rfl: 0 .  sertraline (ZOLOFT) 25 MG tablet, Take 25 mg by mouth daily. , Disp: , Rfl:  .  spironolactone (ALDACTONE) 25 MG tablet, Take 0.5 tablets (12.5 mg total) by mouth daily. (Patient taking differently: Take 12.5 mg by mouth at bedtime. ), Disp: 30 tablet, Rfl: 0 .  tamsulosin (FLOMAX) 0.4 MG CAPS capsule, Take 0.4 mg by mouth at bedtime., Disp: , Rfl:  .  torsemide (DEMADEX) 20 MG tablet, Take 80 mg in the AM (4 tablets) and then 40 mg in the Evening (2 Tablets) Daily, Disp: 180 tablet, Rfl: 0 .  amiodarone (PACERONE) 200 MG tablet, Take 1 tablet (200 mg total) by mouth 2 (two) times daily for 8 days, THEN 1 tablet (200 mg total) daily. (Patient not taking: Reported on 02/25/2020), Disp: 46 tablet, Rfl: 0 .  bisacodyl (DULCOLAX) 10 MG suppository, Place 1 suppository (10 mg total) rectally daily as needed for moderate constipation. (Patient not taking: Reported on 02/25/2020), Disp: 12 suppository, Rfl: 0 .  HYDROcodone-acetaminophen (NORCO/VICODIN) 5-325 MG tablet, Take 2 tablets by mouth every 4 (four) hours as needed. (Patient not taking: Reported on 02/25/2020), Disp: 6 tablet, Rfl: 0 Allergies  Allergen  Reactions  . Lisinopril Swelling    Facial swelling  . Xarelto [Rivaroxaban] Other (See Comments)    "Blood came out of my penis"      Social History   Socioeconomic History  . Marital status: Married    Spouse name: Not on file  . Number of children: Not on file  . Years of education: Not on file  . Highest education level: Not on file  Occupational History  . Occupation: Retired    Comment: Former Public house manager  Tobacco Use  . Smoking status: Former Smoker    Packs/day: 1.00    Years: 15.00    Pack years: 15.00    Types: Cigarettes, Cigars  . Smokeless tobacco: Former Neurosurgeon  . Tobacco comment: quit smoking ~ 50 yr ago  Substance and Sexual Activity  . Alcohol use: No    Alcohol/week: 2.0 standard drinks    Types: 2 Cans of beer per week    Comment: rare beer.  . Drug use: No  .  Sexual activity: Yes  Other Topics Concern  . Not on file  Social History Narrative   Lives in [place] with wife. Moved from [place] (2013). Does not routinely exercise.   Social Determinants of Health   Financial Resource Strain:   . Difficulty of Paying Living Expenses:   Food Insecurity:   . Worried About Charity fundraiser in the Last Year:   . Arboriculturist in the Last Year:   Transportation Needs:   . Film/video editor (Medical):   Marland Kitchen Lack of Transportation (Non-Medical):   Physical Activity:   . Days of Exercise per Week:   . Minutes of Exercise per Session:   Stress:   . Feeling of Stress :   Social Connections:   . Frequency of Communication with Friends and Family:   . Frequency of Social Gatherings with Friends and Family:   . Attends Religious Services:   . Active Member of Clubs or Organizations:   . Attends Archivist Meetings:   Marland Kitchen Marital Status:   Intimate Partner Violence:   . Fear of Current or Ex-Partner:   . Emotionally Abused:   Marland Kitchen Physically Abused:   . Sexually Abused:     Physical Exam      Future Appointments  Date Time Provider  [place]  03/03/2020  3:30 PM Edrick Kins, DPM [facility]  03/11/2020  9:00 AM [facility] PA/NP [facility] None  03/21/2020  7:35 AM [facility] DEVICE REMOTES [facility]  03/27/2020 11:00 AM MC ECHO OP 1 [facility]  03/27/2020 12:00 PM Larey Dresser, MD [facility] None  06/20/2020  7:35 AM [facility] DEVICE REMOTES [facility]  09/19/2020  7:35 AM [facility] DEVICE REMOTES [facility]    BP (!) 102/50   Pulse 78   Temp 98.7 F (37.1 C)   Resp 18   SpO2 98%   Pt is home from hosp admission yesterday. He doesn't particularly look that great. He had to have a lot of assistance getting inside his home and in the bed. He states his legs are still weak, he wasn't able to complete all the PT in the hosp due to him being tired and he felt like he needed more rest.  He states he has PT and a lot of people coming out to see him.  Per his d/c papers he has home health nursing as well.  He is still lying in bed when I arrived. He does not appear to have any swelling in his legs.  I have a weight chart for him and once he is stable on his feet to weigh daily I told him I want him to write it down every day and I will be checking behind him. Not sure if he just forgot his weights when he was weighing daily before his admission--he tells me he weighed every day but he didn't write it down. But obviously the numbers he had been giving me was wrong until the day he weighed in front of me that day I sent him to hosp. Also brought him a med alarm clock to help him be sure to take his meds at each dose. Set it for 07/01/09.  --does not have the amio yet--wife will call me once it comes in should she need assistance putting in pill box.  meds verified and pill box refilled. He denies any complaints or issues at this time. He denies sob or any issues or complaints.  Will f/u next  week.   [name], EMT-Paramedic [phone] [facility] Paramedic   02/25/20

## 2020-02-26 ENCOUNTER — Other Ambulatory Visit (HOSPITAL_COMMUNITY): Payer: Self-pay

## 2020-02-26 NOTE — Progress Notes (Signed)
Came out today for med rec post med delivery in the mail.  His amio was added.  That was placed in pill box.  Upon talking with him I noticed his urinal and the urine was very dark in there, like a dark tea, dark brown/dark red in color.  He is no longer on antibiotics. Pt reports this started yesterday.  He denies any issues or complaints to suggest continued UTI however he denied any symptoms in his previous UTI's. He has been generally weak since he has been home from hosp.  It looks like [facility] home [facility] is trying to get involved but has to have approval from his [place] provider. Home health nurse reached out to pt today and said they would reach out to PCP regarding this matter tomor.  I reached out to PCP office and spoke to nurse to see if they could sch him visit in the next day or two but unable to complete that. She suggested he go back to ER. Pt does not want to go back to ER, adamantly not wanting to go back. I tried calling clinic for further advice, (his eliquis recently increased when he was in [place] too) but no answer, probably due to it being 430 the phones were turned off. I spoke to [place] to see if that was normal for home health to be approved by [facility] provider first and that is normal procedure. So will wait on that and hopefully it gets taken care of soon.  So jenna had idea to speak to triage nurse in morning and see if we can get an order for [facility] for urine test and labs. So that's the plan for now and will go from there.    [name], EMT-Paramedic  02/26/20

## 2020-02-27 ENCOUNTER — Other Ambulatory Visit: Payer: Self-pay

## 2020-02-27 ENCOUNTER — Other Ambulatory Visit (HOSPITAL_COMMUNITY): Payer: Self-pay

## 2020-02-27 ENCOUNTER — Encounter (HOSPITAL_COMMUNITY): Payer: Self-pay

## 2020-02-27 ENCOUNTER — Emergency Department (HOSPITAL_COMMUNITY)
Admission: EM | Admit: 2020-02-27 | Discharge: 2020-02-27 | Disposition: A | Discharge status: Home / Self Care | Payer: No Typology Code available for payment source | Attending: Emergency Medicine | Admitting: Emergency Medicine

## 2020-02-27 ENCOUNTER — Telehealth (HOSPITAL_COMMUNITY): Payer: Self-pay | Admitting: Licensed Clinical Social Worker

## 2020-02-27 DIAGNOSIS — N390 Urinary tract infection, site not specified: Secondary | ICD-10-CM | POA: Diagnosis not present

## 2020-02-27 DIAGNOSIS — Z79899 Other long term (current) drug therapy: Secondary | ICD-10-CM | POA: Insufficient documentation

## 2020-02-27 DIAGNOSIS — E1122 Type 2 diabetes mellitus with diabetic chronic kidney disease: Secondary | ICD-10-CM | POA: Diagnosis not present

## 2020-02-27 DIAGNOSIS — I130 Hypertensive heart and chronic kidney disease with heart failure and stage 1 through stage 4 chronic kidney disease, or unspecified chronic kidney disease: Secondary | ICD-10-CM | POA: Insufficient documentation

## 2020-02-27 DIAGNOSIS — Z7901 Long term (current) use of anticoagulants: Secondary | ICD-10-CM | POA: Insufficient documentation

## 2020-02-27 DIAGNOSIS — I5042 Chronic combined systolic (congestive) and diastolic (congestive) heart failure: Secondary | ICD-10-CM | POA: Insufficient documentation

## 2020-02-27 DIAGNOSIS — I4891 Unspecified atrial fibrillation: Secondary | ICD-10-CM | POA: Diagnosis not present

## 2020-02-27 DIAGNOSIS — N183 Chronic kidney disease, stage 3 unspecified: Secondary | ICD-10-CM | POA: Insufficient documentation

## 2020-02-27 DIAGNOSIS — Z7984 Long term (current) use of oral hypoglycemic drugs: Secondary | ICD-10-CM | POA: Insufficient documentation

## 2020-02-27 DIAGNOSIS — N179 Acute kidney failure, unspecified: Secondary | ICD-10-CM | POA: Diagnosis not present

## 2020-02-27 DIAGNOSIS — N398 Other specified disorders of urinary system: Secondary | ICD-10-CM | POA: Diagnosis present

## 2020-02-27 LAB — CBC
HCT: 30.8 % — ABNORMAL LOW (ref 39.0–52.0)
Hemoglobin: 9.4 g/dL — ABNORMAL LOW (ref 13.0–17.0)
MCH: 29.5 pg (ref 26.0–34.0)
MCHC: 30.5 g/dL (ref 30.0–36.0)
MCV: 96.6 fL (ref 80.0–100.0)
Platelets: 181 10*3/uL (ref 150–400)
RBC: 3.19 MIL/uL — ABNORMAL LOW (ref 4.22–5.81)
RDW: 19.4 % — ABNORMAL HIGH (ref 11.5–15.5)
WBC: 5.9 10*3/uL (ref 4.0–10.5)
nRBC: 0 % (ref 0.0–0.2)

## 2020-02-27 LAB — BASIC METABOLIC PANEL
Anion gap: 13 (ref 5–15)
BUN: 48 mg/dL — ABNORMAL HIGH (ref 8–23)
CO2: 32 mmol/L (ref 22–32)
Calcium: 8.7 mg/dL — ABNORMAL LOW (ref 8.9–10.3)
Chloride: 93 mmol/L — ABNORMAL LOW (ref 98–111)
Creatinine, Ser: 2 mg/dL — ABNORMAL HIGH (ref 0.61–1.24)
GFR calc Af Amer: 35 mL/min — ABNORMAL LOW (ref >60)
GFR calc non Af Amer: 30 mL/min — ABNORMAL LOW (ref >60)
Glucose, Bld: 155 mg/dL — ABNORMAL HIGH (ref 70–99)
Potassium: 3.7 mmol/L (ref 3.5–5.1)
Sodium: 138 mmol/L (ref 135–145)

## 2020-02-27 LAB — CBG MONITORING, ED: Glucose-Capillary: 152 mg/dL — ABNORMAL HIGH (ref 70–99)

## 2020-02-27 LAB — URINALYSIS, ROUTINE W REFLEX MICROSCOPIC
Bilirubin Urine: NEGATIVE
Glucose, UA: NEGATIVE mg/dL
Ketones, ur: NEGATIVE mg/dL
Nitrite: NEGATIVE
Protein, ur: 100 mg/dL — AB
RBC / HPF: >50 RBC/hpf — ABNORMAL HIGH (ref 0–5)
Specific Gravity, Urine: 1.01 (ref 1.005–1.030)
WBC, UA: >50 WBC/hpf — ABNORMAL HIGH (ref 0–5)
pH: 6 (ref 5.0–8.0)

## 2020-02-27 MED ORDER — CEPHALEXIN 500 MG PO CAPS: 500.0000 mg | ORAL_CAPSULE | Freq: Two times a day (BID) | ORAL | 0 refills | Status: DC

## 2020-02-27 MED ORDER — CEPHALEXIN 250 MG PO CAPS
500.0000 mg | ORAL_CAPSULE | Freq: Once | ORAL | Status: AC
  Administered 2020-02-27: 17:00:00 500 mg via ORAL
  Filled 2020-02-27: qty 2

## 2020-02-27 NOTE — ED Notes (Signed)
Pt still waiting for ptar to transport

## 2020-02-27 NOTE — ED Notes (Signed)
ptar called by dee pt is number 5 on the list

## 2020-02-27 NOTE — ED Notes (Signed)
Urine culture collected sent to main lab

## 2020-02-27 NOTE — Progress Notes (Signed)
Paramedicine Encounter    Patient ID: Ryan Macdonald, male    DOB: 05-Jun-1934, 86 y.o.   MRN: [id number]   Patient Care Team: Center, [facility] as PCP - General (General Practice) [name], MD as PCP - Cardiology (Cardiology) Laurey Morale, MD as PCP - Advanced Heart Failure (Cardiology)  Patient Active Problem List   Diagnosis Date Noted  . Hematuria 02/15/2020  . CKD (chronic kidney disease), stage III 02/15/2020  . Constipation 02/13/2020  . Anemia 02/13/2020  . Secondary hypercoagulable state (HCC) 01/16/2020  . AKI (acute kidney injury) (HCC) 03/14/2019  . Spasm of muscle of lower back 03/14/2019  . Acute renal failure (ARF) (HCC) 03/09/2019  . Transaminitis   . Acute on chronic renal failure (HCC) 03/08/2019  . Acute exacerbation of CHF (congestive heart failure) (HCC) 11/23/2018  . Acute on chronic heart failure (HCC) 10/19/2018  . Pulmonary emphysema (HCC)   . Chronic respiratory failure with hypoxia (HCC)   . Morbid obesity (HCC) 09/09/2018  . URI (upper respiratory infection) 08/31/2018  . UTI (urinary tract infection) 08/31/2018  . Chronic systolic (congestive) heart failure (HCC) 08/28/2018  . CHF (congestive heart failure) (HCC) 08/14/2018  . Acute on chronic combined systolic and diastolic CHF (congestive heart failure) (HCC) 07/18/2018  . First degree AV block   . Shortness of breath 04/29/2018  . Acute HFrEF (heart failure with reduced ejection fraction) (HCC) 04/29/2018  . Acute respiratory failure with hypoxia (HCC) 09/27/2017  . CHF exacerbation (HCC) 09/27/2017  . Renal insufficiency   . Acute on chronic systolic CHF (congestive heart failure) (HCC) 07/08/2017  . Left knee pain 07/08/2017  . Acute bronchitis 05/10/2017  . Acute congestive heart failure (HCC) 04/14/2017  . Elevated troponin 04/14/2017  . Lactic acidosis   . Acute on chronic systolic heart failure, NYHA class 2 (HCC) 02/03/2017  . Diabetes mellitus type 2 in obese  (HCC) 02/03/2017  . CAP (community acquired pneumonia) 02/03/2017  . Lobar pneumonia (HCC)   . Diabetes mellitus with complication (HCC)   . Acute on chronic respiratory failure with hypoxia (HCC)   . Acute on chronic combined systolic and diastolic heart failure (HCC) 05/19/2016  . Depression 05/19/2016  . Gout 05/19/2016  . 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 NSVT (nonsustained ventricular tachycardia) (HCC)   . Chronic renal insufficiency, stage 3 (moderate) (HCC)   . Hyperkalemia 08/21/2015  . Pulmonary embolism (HCC)   . Acute respiratory failure (HCC)   . Osteoarthritis of left hip 10/25/2013  . Atrial fibrillation (HCC) 10/25/2013  . S/P hip replacement 10/24/2013  . Nonischemic cardiomyopathy (HCC)   . Obesity (BMI 30-39.9)   . Essential hypertension   . Hyperlipidemia     Current Outpatient Medications:  .  allopurinol (ZYLOPRIM) 100 MG tablet, Take 200 mg by mouth daily. , Disp: , Rfl:  .  amiodarone (PACERONE) 200 MG tablet, Take 1 tablet (200 mg total) by mouth 2 (two) times daily for 8 days, THEN 1 tablet (200 mg total) daily. (Patient not taking: Reported on 02/25/2020), Disp: 46 tablet, Rfl: 0 .  apixaban (ELIQUIS) 5 MG TABS tablet, Take 1 tablet (5 mg total) by mouth 2 (two) times daily., Disp: 60 tablet, Rfl: 0 .  bisacodyl (DULCOLAX) 10 MG suppository, Place 1 suppository (10 mg total) rectally daily as needed for moderate constipation. (Patient not taking: Reported on 02/25/2020), Disp: 12 suppository, Rfl: 0 .  cyanocobalamin 1000 MCG tablet, Take 1 tablet (1,000 mcg total) by mouth daily.,  Disp: 90 tablet, Rfl: 0 .  diclofenac Sodium (VOLTAREN) 1 % GEL, Apply 4 g topically 4 (four) times daily., Disp: 50 g, Rfl: 0 .  finasteride (PROSCAR) 5 MG tablet, Take 1 tablet (5 mg total) by mouth daily., Disp: 30 tablet, Rfl: 0 .  glipiZIDE (GLUCOTROL) 5 MG tablet, Take 5 mg by mouth daily before breakfast., Disp: , Rfl:  .  hydrALAZINE  (APRESOLINE) 25 MG tablet, Take 0.5 tablets (12.5 mg total) by mouth every 8 (eight) hours., Disp: 90 tablet, Rfl: 0 .  HYDROcodone-acetaminophen (NORCO/VICODIN) 5-325 MG tablet, Take 2 tablets by mouth every 4 (four) hours as needed. (Patient not taking: Reported on 02/25/2020), Disp: 6 tablet, Rfl: 0 .  ipratropium-albuterol (DUONEB) 0.5-2.5 (3) MG/3ML SOLN, Take 3 mLs by nebulization every 4 (four) hours as needed. (Patient taking differently: Take 3 mLs by nebulization every 4 (four) hours as needed (wheezing/shortness of breath). ), Disp: 360 mL, Rfl: 0 .  isosorbide mononitrate (IMDUR) 30 MG 24 hr tablet, Take 1 tablet (30 mg total) by mouth daily., Disp: 30 tablet, Rfl: 0 .  levothyroxine (SYNTHROID) 125 MCG tablet, Take 1 tablet (125 mcg total) by mouth daily before breakfast., Disp: 90 tablet, Rfl: 1 .  magnesium oxide (MAG-OX) 400 MG tablet, Take 400 mg by mouth daily., Disp: , Rfl:  .  methocarbamol (ROBAXIN) 500 MG tablet, Take 1 tablet (500 mg total) by mouth every 6 (six) hours as needed for muscle spasms., Disp: 30 tablet, Rfl: 0 .  polyethylene glycol (MIRALAX / GLYCOLAX) 17 g packet, Take 17 g by mouth 2 (two) times daily., Disp: 14 each, Rfl: 0 .  rosuvastatin (CRESTOR) 10 MG tablet, Take 1 tablet (10 mg total) by mouth daily at 6 PM., Disp: 30 tablet, Rfl: 5 .  senna-docusate (SENOKOT-S) 8.6-50 MG tablet, Take 1 tablet by mouth 2 (two) times daily., Disp: 30 tablet, Rfl: 0 .  sertraline (ZOLOFT) 25 MG tablet, Take 25 mg by mouth daily. , Disp: , Rfl:  .  spironolactone (ALDACTONE) 25 MG tablet, Take 0.5 tablets (12.5 mg total) by mouth daily. (Patient taking differently: Take 12.5 mg by mouth at bedtime. ), Disp: 30 tablet, Rfl: 0 .  tamsulosin (FLOMAX) 0.4 MG CAPS capsule, Take 0.4 mg by mouth at bedtime., Disp: , Rfl:  .  torsemide (DEMADEX) 20 MG tablet, Take 80 mg in the AM (4 tablets) and then 40 mg in the Evening (2 Tablets) Daily, Disp: 180 tablet, Rfl: 0 Allergies  Allergen  Reactions  . Lisinopril Swelling    Facial swelling  . Xarelto [Rivaroxaban] Other (See Comments)    "Blood came out of my penis"      Social History   Socioeconomic History  . Marital status: Married    Spouse name: Not on file  . Number of children: Not on file  . Years of education: Not on file  . Highest education level: Not on file  Occupational History  . Occupation: Retired    Comment: Former Public house manager  Tobacco Use  . Smoking status: Former Smoker    Packs/day: 1.00    Years: 15.00    Pack years: 15.00    Types: Cigarettes, Cigars  . Smokeless tobacco: Former Neurosurgeon  . Tobacco comment: quit smoking ~ 50 yr ago  Substance and Sexual Activity  . Alcohol use: No    Alcohol/week: 2.0 standard drinks    Types: 2 Cans of beer per week    Comment: rare beer.  . Drug use: No  .  Sexual activity: Yes  Other Topics Concern  . Not on file  Social History Narrative   Lives in [place] with wife. Moved from [place] (2013). Does not routinely exercise.   Social Determinants of Health   Financial Resource Strain:   . Difficulty of Paying Living Expenses:   Food Insecurity:   . Worried About Charity fundraiser in the Last Year:   . Arboriculturist in the Last Year:   Transportation Needs:   . Film/video editor (Medical):   Marland Kitchen Lack of Transportation (Non-Medical):   Physical Activity:   . Days of Exercise per Week:   . Minutes of Exercise per Session:   Stress:   . Feeling of Stress :   Social Connections:   . Frequency of Communication with Friends and Family:   . Frequency of Social Gatherings with Friends and Family:   . Attends Religious Services:   . Active Member of Clubs or Organizations:   . Attends Archivist Meetings:   Marland Kitchen Marital Status:   Intimate Partner Violence:   . Fear of Current or Ex-Partner:   . Emotionally Abused:   Marland Kitchen Physically Abused:   . Sexually Abused:     Physical Exam      Future Appointments  Date Time Provider  [place]  03/03/2020  3:30 PM Edrick Kins, DPM [facility]  03/11/2020  9:00 AM [facility] PA/NP [facility] None  03/21/2020  7:35 AM [facility] DEVICE REMOTES [facility]  03/27/2020 11:00 AM MC ECHO OP 1 [facility]  03/27/2020 12:00 PM Larey Dresser, MD [facility] None  06/20/2020  7:35 AM [facility] DEVICE REMOTES [facility]  09/19/2020  7:35 AM [facility] DEVICE REMOTES [facility]    BP 112/80   Pulse 78   Temp 98.9 F (37.2 C)   Resp 18   SpO2 95%   After exhausting all resources to avoid an ER visit, pt had to go to hosp for f/u on that cola/dark tea color urine for probable UTI again. He was d/c on Sunday, no oral antibiotics prescribed for d/c and his eliquis was increased to 5mg  BID. He denies any further symptoms of UTI however he denied those last time as well. So EMS was called and [facility].   Marylouise Stacks, [place] Paramedic  02/27/20

## 2020-02-27 NOTE — ED Provider Notes (Signed)
[name] EMERGENCY DEPARTMENT Provider Note   CSN: [id number] Arrival date & time: 02/27/20  1300     History Chief Complaint  Patient presents with  . Dark Urine    Ryan Macdonald is a 86 y.o. male.  The history is provided by the patient and medical records. No language interpreter was used.   Ryan Macdonald is a 86 y.o. male who presents to the Emergency Department complaining of dark urine. He presents to the emergency department for evaluation of dark colored urine. He describes his urine as looking like a light colored Coca-Cola. He was recently admitted to the hospital for CHF exacerbation and had a UTI when he was admitted. He was on antibiotics during admission and these were discontinued at the time of discharge. He states his urine was clear at the time of discharge and then shortly thereafter became darker in color. He states this dark color is consistent with his urinary tract infections. He denies any fevers, nausea, vomiting, chest pain, shortness of breath, back pain, abdominal pain. He states he is able to empty his bladder without difficulty. He has been compliant with all his medications since hospital discharge.    Past Medical History:  Diagnosis Date  . AKI (acute kidney injury) (HCC) 02/2019  . Atrial fibrillation (HCC)   . Cardiomyopathy (HCC)   . Cat scratch fever    removed mass on right side neck  . Cataracts, bilateral   . CHF (congestive heart failure) (HCC)   . Chronic kidney disease   . Corns and callosities   . Diabetes mellitus without complication (HCC)    TYPE 2  . Dyspnea   . Erythema intertrigo   . Flat foot   . Gout   . High cholesterol   . Hx of urethral stricture   . Hypertension   . Kidney stones   . Morbid obesity (HCC)   . Nonischemic cardiomyopathy (HCC)    a. ? 2009 Cath in [place] - nl cors per pt;  b. 09/2013 Echo: EF 25-30%, sev diff HK.  . Obesity   . Osteoarthritis   . PAF (paroxysmal atrial fibrillation)  (HCC)    a. post-op hip in 2014 - prev on xarelto.  . Renal insufficiency     Patient Active Problem List   Diagnosis Date Noted  . Hematuria 02/15/2020  . CKD (chronic kidney disease), stage III 02/15/2020  . Constipation 02/13/2020  . Anemia 02/13/2020  . Secondary hypercoagulable state (HCC) 01/16/2020  . AKI (acute kidney injury) (HCC) 03/14/2019  . Spasm of muscle of lower back 03/14/2019  . Acute renal failure (ARF) (HCC) 03/09/2019  . Transaminitis   . Acute on chronic renal failure (HCC) 03/08/2019  . Acute exacerbation of CHF (congestive heart failure) (HCC) 11/23/2018  . Acute on chronic heart failure (HCC) 10/19/2018  . Pulmonary emphysema (HCC)   . Chronic respiratory failure with hypoxia (HCC)   . Morbid obesity (HCC) 09/09/2018  . URI (upper respiratory infection) 08/31/2018  . UTI (urinary tract infection) 08/31/2018  . Chronic systolic (congestive) heart failure (HCC) 08/28/2018  . CHF (congestive heart failure) (HCC) 08/14/2018  . Acute on chronic combined systolic and diastolic CHF (congestive heart failure) (HCC) 07/18/2018  . First degree AV block   . Shortness of breath 04/29/2018  . Acute HFrEF (heart failure with reduced ejection fraction) (HCC) 04/29/2018  . Acute respiratory failure with hypoxia (HCC) 09/27/2017  . CHF exacerbation (HCC) 09/27/2017  . Renal insufficiency   .  Acute on chronic systolic CHF (congestive heart failure) (HCC) 07/08/2017  . Left knee pain 07/08/2017  . Acute bronchitis 05/10/2017  . Acute congestive heart failure (HCC) 04/14/2017  . Elevated troponin 04/14/2017  . Lactic acidosis   . Acute on chronic systolic heart failure, NYHA class 2 (HCC) 02/03/2017  . Diabetes mellitus type 2 in obese (HCC) 02/03/2017  . CAP (community acquired pneumonia) 02/03/2017  . Lobar pneumonia (HCC)   . Diabetes mellitus with complication (HCC)   . Acute on chronic respiratory failure with hypoxia (HCC)   . Acute on chronic combined systolic  and diastolic heart failure (HCC) 05/19/2016  . Depression 05/19/2016  . Gout 05/19/2016  . 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 NSVT (nonsustained ventricular tachycardia) (HCC)   . Chronic renal insufficiency, stage 3 (moderate) (HCC)   . Hyperkalemia 08/21/2015  . Pulmonary embolism (HCC)   . Acute respiratory failure (HCC)   . Osteoarthritis of left hip 10/25/2013  . Atrial fibrillation (HCC) 10/25/2013  . S/P hip replacement 10/24/2013  . Nonischemic cardiomyopathy (HCC)   . Obesity (BMI 30-39.9)   . Essential hypertension   . Hyperlipidemia     Past Surgical History:  Procedure Laterality Date  . CARDIAC CATHETERIZATION  1980's  . CARDIOVERSION N/A 01/15/2020   Procedure: CARDIOVERSION;  Surgeon: Laurey Morale, MD;  Location: [facility] ENDOSCOPY;  Service: Cardiovascular;  Laterality: N/A;  . CIRCUMCISION  1940's  . CORONARY STENT INTERVENTION N/A 08/18/2018   Procedure: CORONARY STENT INTERVENTION;  Surgeon: Corky Crafts, MD;  Location: [facility] INVASIVE CV LAB;  Service: Cardiovascular;  Laterality: N/A;  . CYSTOSCOPY WITH URETHRAL DILATATION  10/23/2013  . CYSTOSCOPY WITH URETHRAL DILATATION N/A 10/23/2013   Procedure: CYSTOSCOPY WITH URETHRAL DILATATION;  Surgeon: Kathi Ludwig, MD;  Location: [facility] OR;  Service: Urology;  Laterality: N/A;  . INCISION AND DRAINAGE OF WOUND Right 1980's   "cat scratch" (10/23/2013)  . INGUINAL HERNIA REPAIR Right 1950's  . INTRAVASCULAR ULTRASOUND/IVUS N/A 08/18/2018   Procedure: Intravascular Ultrasound/IVUS;  Surgeon: Corky Crafts, MD;  Location: [facility] INVASIVE CV LAB;  Service: Cardiovascular;  Laterality: N/A;  . KIDNEY STONE SURGERY Right 1970's; 1983   "twice"  . LEFT HEART CATH AND CORONARY ANGIOGRAPHY N/A 08/18/2018   Procedure: LEFT HEART CATH AND CORONARY ANGIOGRAPHY;  Surgeon: Corky Crafts, MD;  Location: [facility] INVASIVE CV LAB;  Service: Cardiovascular;  Laterality: N/A;  . RIGHT HEART  CATH N/A 02/19/2020   Procedure: RIGHT HEART CATH;  Surgeon: Laurey Morale, MD;  Location: [facility] INVASIVE CV LAB;  Service: Cardiovascular;  Laterality: N/A;  . TEE WITHOUT CARDIOVERSION N/A 01/15/2020   Procedure: TRANSESOPHAGEAL ECHOCARDIOGRAM (TEE);  Surgeon: Laurey Morale, MD;  Location: [facility] ENDOSCOPY;  Service: Cardiovascular;  Laterality: N/A;  . TONSILLECTOMY AND ADENOIDECTOMY  1940's  . TOTAL HIP ARTHROPLASTY Left 10/23/2013   Procedure: TOTAL HIP ARTHROPLASTY;  Surgeon: Valeria Batman, MD;  Location: [facility] OR;  Service: Orthopedics;  Laterality: Left;       Family History  Problem Relation Age of Onset  . Heart disease Mother   . Heart Problems Maternal Grandmother   . Other Other        negative for premature CAD    Social History   Tobacco Use  . Smoking status: Former Smoker    Packs/day: 1.00    Years: 15.00    Pack years: 15.00    Types: Cigarettes, Cigars  . Smokeless tobacco: Former Neurosurgeon  . Tobacco comment: quit  smoking ~ 50 yr ago  Substance Use Topics  . Alcohol use: No    Alcohol/week: 2.0 standard drinks    Types: 2 Cans of beer per week    Comment: rare beer.  . Drug use: No    Home Medications Prior to Admission medications   Medication Sig Start Date End Date Taking? Authorizing Provider  allopurinol (ZYLOPRIM) 100 MG tablet Take 200 mg by mouth daily.     [provider]  amiodarone (PACERONE) 200 MG tablet Take 1 tablet (200 mg total) by mouth 2 (two) times daily for 8 days, THEN 1 tablet (200 mg total) daily. Patient not taking: Reported on 02/25/2020 02/24/20 04/02/20  Raiford Noble Latif, DO  apixaban (ELIQUIS) 5 MG TABS tablet Take 1 tablet (5 mg total) by mouth 2 (two) times daily. 02/24/20   Raiford Noble Latif, DO  bisacodyl (DULCOLAX) 10 MG suppository Place 1 suppository (10 mg total) rectally daily as needed for moderate constipation. Patient not taking: Reported on 02/25/2020 02/24/20   Raiford Noble Latif, DO  cyanocobalamin 1000  MCG tablet Take 1 tablet (1,000 mcg total) by mouth daily. 03/13/19   Larey Dresser, MD  diclofenac Sodium (VOLTAREN) 1 % GEL Apply 4 g topically 4 (four) times daily. 02/24/20   Raiford Noble Latif, DO  finasteride (PROSCAR) 5 MG tablet Take 1 tablet (5 mg total) by mouth daily. 11/29/18   Georgiana Shore, NP  glipiZIDE (GLUCOTROL) 5 MG tablet Take 5 mg by mouth daily before breakfast.    [provider]  hydrALAZINE (APRESOLINE) 25 MG tablet Take 0.5 tablets (12.5 mg total) by mouth every 8 (eight) hours. 02/24/20   Raiford Noble Latif, DO  HYDROcodone-acetaminophen (NORCO/VICODIN) 5-325 MG tablet Take 2 tablets by mouth every 4 (four) hours as needed. Patient not taking: Reported on 02/25/2020 04/09/16   Gibson Ramp, MD  ipratropium-albuterol (DUONEB) 0.5-2.5 (3) MG/3ML SOLN Take 3 mLs by nebulization every 4 (four) hours as needed. Patient taking differently: Take 3 mLs by nebulization every 4 (four) hours as needed (wheezing/shortness of breath).  05/14/17   Jani Gravel, MD  isosorbide mononitrate (IMDUR) 30 MG 24 hr tablet Take 1 tablet (30 mg total) by mouth daily. 02/25/20   Raiford Noble Latif, DO  levothyroxine (SYNTHROID) 125 MCG tablet Take 1 tablet (125 mcg total) by mouth daily before breakfast. 08/01/19   Philemon Kingdom, MD  magnesium oxide (MAG-OX) 400 MG tablet Take 400 mg by mouth daily.    [provider]  methocarbamol (ROBAXIN) 500 MG tablet Take 1 tablet (500 mg total) by mouth every 6 (six) hours as needed for muscle spasms. 03/15/19   Masoudi, Elhamalsadat, MD  polyethylene glycol (MIRALAX / GLYCOLAX) 17 g packet Take 17 g by mouth 2 (two) times daily. 02/24/20   Raiford Noble Latif, DO  rosuvastatin (CRESTOR) 10 MG tablet Take 1 tablet (10 mg total) by mouth daily at 6 PM. 12/22/18   Skeet Latch, MD  senna-docusate (SENOKOT-S) 8.6-50 MG tablet Take 1 tablet by mouth 2 (two) times daily. 02/24/20   Raiford Noble Latif, DO  sertraline (ZOLOFT) 25 MG tablet Take  25 mg by mouth daily.     [provider]  spironolactone (ALDACTONE) 25 MG tablet Take 0.5 tablets (12.5 mg total) by mouth daily. Patient taking differently: Take 12.5 mg by mouth at bedtime.  02/25/20   Raiford Noble Latif, DO  tamsulosin (FLOMAX) 0.4 MG CAPS capsule Take 0.4 mg by mouth at bedtime.  [provider]  torsemide (DEMADEX) 20 MG tablet Take 80 mg in the AM (4 tablets) and then 40 mg in the Evening (2 Tablets) Daily 02/24/20   Marguerita Merles Latif, DO    Allergies    Lisinopril and Xarelto [rivaroxaban]  Review of Systems   Review of Systems  All other systems reviewed and are negative.   Physical Exam Updated Vital Signs BP 113/63   Pulse 79   Temp 98.9 F (37.2 C) (Oral)   Resp 16   Ht 5\' 7"  (1.702 m)   Wt 91.6 kg   SpO2 92%   BMI 31.64 kg/m   Physical Exam Vitals and nursing note reviewed.  Constitutional:      Appearance: He is well-developed.  HENT:     Head: Normocephalic and atraumatic.  Cardiovascular:     Rate and Rhythm: Normal rate and regular rhythm.     Heart sounds: No murmur.  Pulmonary:     Effort: Pulmonary effort is normal. No respiratory distress.     Breath sounds: Normal breath sounds.  Abdominal:     Palpations: Abdomen is soft.     Tenderness: There is no abdominal tenderness. There is no guarding or rebound.  Musculoskeletal:        General: No tenderness.     Comments: Trace edema to bilateral lower extremities  Skin:    General: Skin is warm and dry.  Neurological:     Mental Status: He is alert and oriented to person, place, and time.  Psychiatric:        Behavior: Behavior normal.     ED Results / Procedures / Treatments   Labs (all labs ordered are listed, but only abnormal results are displayed) Labs Reviewed  BASIC METABOLIC PANEL - Abnormal; Notable for the following components:      Result Value   Chloride 93 (*)    Glucose, Bld 155 (*)    BUN 48 (*)    Creatinine, Ser 2.00 (*)     Calcium 8.7 (*)    GFR calc non Af Amer 30 (*)    GFR calc Af Amer 35 (*)    All other components within normal limits  CBC - Abnormal; Notable for the following components:   RBC 3.19 (*)    Hemoglobin 9.4 (*)    HCT 30.8 (*)    RDW 19.4 (*)    All other components within normal limits  URINALYSIS, ROUTINE W REFLEX MICROSCOPIC - Abnormal; Notable for the following components:   Color, Urine AMBER (*)    APPearance TURBID (*)    Hgb urine dipstick LARGE (*)    Protein, ur 100 (*)    Leukocytes,Ua MODERATE (*)    RBC / HPF >50 (*)    WBC, UA >50 (*)    Bacteria, UA MANY (*)    All other components within normal limits  CBG MONITORING, ED - Abnormal; Notable for the following components:   Glucose-Capillary 152 (*)    All other components within normal limits  URINE CULTURE    EKG None  Radiology No results found.  Procedures Procedures (including critical care time)  Medications Ordered in ED Medications  cephALEXin (KEFLEX) capsule 500 mg (has no administration in time range)    ED Course  I have reviewed the triage vital signs and the nursing notes.  Pertinent labs & imaging results that were available during my care of the patient were reviewed by me and considered in my medical decision  making (see chart for details).    MDM Rules/Calculators/A&P                     Patient with recent admission for CHF exacerbation here for evaluation of dark colored urine. Labs are consistent with AKI, UA concerning for UTI. Will send a culture. Patient does have a history of kidney stones but has no back pain, current presentation is not consistent with infected kidney stone. Will treat for UTI. Given patient's rising creatinine will discontinue his diuretics for one day with close cardiology follow-up. Discussed importance of return precautions.  Final Clinical Impression(s) / ED Diagnoses Final diagnoses:  Acute UTI  AKI (acute kidney injury) [facility])    Rx / DC Orders ED  Discharge Orders    None       Tilden Fossa, MD 02/27/20 1715

## 2020-02-27 NOTE — ED Notes (Signed)
Pt in wheelchair watching TV; Pt continues to wait for PTAR-[place],RN

## 2020-02-27 NOTE — ED Notes (Signed)
Pt verbalized understanding of discharge instructions. Prescriptions reviewed. PTAR called for transported home.

## 2020-02-27 NOTE — ED Triage Notes (Signed)
Pt BIB [facility] for eval of coke colored urine x 2-3 days. Reports hx of UTI. Denies subjective c/o, denies flank pain, dysuria, hematuria. Afebrile.

## 2020-02-27 NOTE — Telephone Encounter (Signed)
CSW informed by [employer] that pt currently with blood in his urine and not wanting to go to the ED.  [facility] and spoke to triage RN regarding situation- was able to connect pt to the line with triage RN who encouraged pt to go to the ED to get evaluated as this could be an urgent need and they could not get him in to see PCP immediately.  Pt expressed understanding of recommendation but unsure how he would get to ED- encouraged by triage RN to call ambulance if he is too weak to get in and out of the car.  CSW updated Scientist, research (medical) regarding conversation and she will plan to also follow up with patient to encourage he go to ED for evaluation  CSW will continue to follow and assist as needed  Burna Sis, LCSW Clinical Social Worker [facility] Desk#: [phone] Cell#: [phone]

## 2020-02-27 NOTE — ED Notes (Signed)
Pt moved from another part of the  Ed to hold in purple  No nurse call available  Charge nurse aware

## 2020-03-01 LAB — URINE CULTURE: Culture: >=100000 — AB

## 2020-03-02 ENCOUNTER — Telehealth: Payer: Self-pay | Admitting: *Deleted

## 2020-03-02 NOTE — Progress Notes (Signed)
ED Antimicrobial Stewardship Positive Culture Follow Up   Ryan Macdonald is an 86 y.o. male who presented to [facility] on 02/27/2020 with a chief complaint of  Chief Complaint  Patient presents with  . Dark Urine    Recent Results (from the past 720 hour(s))  SARS CORONAVIRUS 2 (TAT 6-24 HRS) Nasopharyngeal Nasopharyngeal Swab     Status: None   Collection Time: 02/11/20  6:09 PM   Specimen: Nasopharyngeal Swab  Result Value Ref Range Status   SARS Coronavirus 2 NEGATIVE NEGATIVE Final    Comment: (NOTE) SARS-CoV-2 target nucleic acids are NOT DETECTED. The SARS-CoV-2 RNA is generally detectable in upper and lower respiratory specimens during the acute phase of infection. Negative results do not preclude SARS-CoV-2 infection, do not rule out co-infections with other pathogens, and should not be used as the sole basis for treatment or other patient management decisions. Negative results must be combined with clinical observations, patient history, and epidemiological information. The expected result is Negative. Fact Sheet for Patients: SugarRoll.be Fact Sheet for Healthcare Providers: https://www.woods-mathews.com/ This test is not yet approved or cleared by the [place] FDA and  has been authorized for detection and/or diagnosis of SARS-CoV-2 by FDA under an Emergency Use Authorization (EUA). This EUA will remain  in effect (meaning this test can be used) for the duration of the COVID-19 declaration under Section 56 4(b)(1) of the Act, 21 U.S.C. section 360bbb-3(b)(1), unless the authorization is terminated or revoked sooner. Performed at [facility], [place] [address] [place], [place] [address]   Culture, Urine     Status: Abnormal   Collection Time: 02/15/20 11:39 AM   Specimen: Urine, Clean Catch  Result Value Ref Range Status   Specimen Description URINE, CLEAN CATCH  Final   Special Requests   Final     NONE Performed at [facility], [place] [address] [place], [place] [address]    Culture >=100,000 COLONIES/mL ESCHERICHIA COLI (A)  Final   Report Status 02/17/2020 FINAL  Final   Organism ID, Bacteria ESCHERICHIA COLI (A)  Final      Susceptibility   Escherichia coli - MIC*    AMPICILLIN <=2 SENSITIVE Sensitive     CEFAZOLIN <=4 SENSITIVE Sensitive     CEFTRIAXONE <=0.25 SENSITIVE Sensitive     CIPROFLOXACIN <=0.25 SENSITIVE Sensitive     GENTAMICIN <=1 SENSITIVE Sensitive     IMIPENEM <=0.25 SENSITIVE Sensitive     NITROFURANTOIN <=16 SENSITIVE Sensitive     TRIMETH/SULFA <=20 SENSITIVE Sensitive     AMPICILLIN/SULBACTAM <=2 SENSITIVE Sensitive     PIP/TAZO <=4 SENSITIVE Sensitive     * >=100,000 COLONIES/mL ESCHERICHIA COLI  Surgical pcr screen     Status: Abnormal   Collection Time: 02/19/20  3:51 AM   Specimen: Nasal Mucosa; Nasal Swab  Result Value Ref Range Status   MRSA, PCR POSITIVE (A) NEGATIVE Final    Comment: RESULT CALLED TO, READ BACK BY AND VERIFIED WITH: Beather Arbour RN 02/19/20 0510 JDW    Staphylococcus aureus POSITIVE (A) NEGATIVE Final    Comment: (NOTE) The Xpert SA Assay (FDA approved for NASAL specimens in patients 23 years of age and older), is one component of a comprehensive surveillance program. It is not intended to diagnose infection nor to guide or monitor treatment. Performed at [facility], [place] [address], [place], [place] [address]   Urine culture     Status: Abnormal   Collection Time: 02/27/20  1:17 PM  Specimen: Urine, Random  Result Value Ref Range Status   Specimen Description URINE, RANDOM  Final   Special Requests   Final    NONE Performed at [facility], [address], [place], [place] [address]    Culture (A)  Final    >=100,000 COLONIES/mL ENTEROBACTER CLOACAE 60,000 COLONIES/mL ESCHERICHIA COLI    Report Status 03/01/2020 FINAL  Final   Organism ID, Bacteria ENTEROBACTER CLOACAE (A)  Final   Organism ID,  Bacteria ESCHERICHIA COLI (A)  Final      Susceptibility   Enterobacter cloacae - MIC*    CEFAZOLIN >=64 RESISTANT Resistant     CIPROFLOXACIN <=0.25 SENSITIVE Sensitive     GENTAMICIN <=1 SENSITIVE Sensitive     IMIPENEM <=0.25 SENSITIVE Sensitive     NITROFURANTOIN 64 INTERMEDIATE Intermediate     TRIMETH/SULFA <=20 SENSITIVE Sensitive     PIP/TAZO >=128 RESISTANT Resistant     * >=100,000 COLONIES/mL ENTEROBACTER CLOACAE   Escherichia coli - MIC*    AMPICILLIN <=2 SENSITIVE Sensitive     CEFAZOLIN <=4 SENSITIVE Sensitive     CEFTRIAXONE <=0.25 SENSITIVE Sensitive     CIPROFLOXACIN <=0.25 SENSITIVE Sensitive     GENTAMICIN <=1 SENSITIVE Sensitive     IMIPENEM <=0.25 SENSITIVE Sensitive     NITROFURANTOIN <=16 SENSITIVE Sensitive     TRIMETH/SULFA <=20 SENSITIVE Sensitive     AMPICILLIN/SULBACTAM <=2 SENSITIVE Sensitive     PIP/TAZO <=4 SENSITIVE Sensitive     * 60,000 COLONIES/mL ESCHERICHIA COLI    [x]  Treated with keflex, organism resistant to prescribed antimicrobial  New antibiotic prescription: Bactrim DS BID x 7d  ED Provider: 03/02/2020, 11:45 AM Clinical Pharmacist Monday - Friday phone -  [phone] Saturday - Sunday phone - [phone]

## 2020-03-02 NOTE — Telephone Encounter (Signed)
Post ED Visit - Positive Culture Follow-up: Successful Patient Follow-Up  Culture assessed and recommendations reviewed by:  []  , Pharm.D. []  Enzo Bi, Pharm.D., BCPS AQ-ID []  , Pharm.D., BCPS []  Celedonio Miyamoto, Pharm.D., BCPS []  [place], Garvin Fila.D., BCPS, AAHIVP []  , Pharm.D., BCPS, AAHIVP []  Georgina Pillion, PharmD, BCPS []  , PharmD, BCPS []  [place], PharmD, BCPS []  [address], PharmD  Positive urine culture  []  Patient discharged without antimicrobial prescription and treatment is now indicated [x]  Organism is resistant to prescribed ED discharge antimicrobial []  Patient with positive blood cultures  Changes discussed with ED provider , Estella Husk New antibiotic prescription Bactrim DS 1tab PO BID x 7 days Called to [facility] [phone]  Contacted patient, date 03/02/2020, time 1045   Phillips Climes [place] 03/02/2020, 10:44 AM

## 2020-03-03 ENCOUNTER — Ambulatory Visit: Payer: Non-veteran care | Admitting: Podiatry

## 2020-03-03 ENCOUNTER — Other Ambulatory Visit (HOSPITAL_COMMUNITY): Payer: Self-pay

## 2020-03-03 NOTE — Progress Notes (Signed)
Paramedicine Encounter    Patient ID: Ryan Macdonald, male    DOB: Dec 06, 1933, 86 y.o.   MRN: [id number]   Patient Care Team: [facility], Bulger as PCP - General ([place]) Skeet Latch, MD as PCP - Cardiology (Cardiology) Larey Dresser, MD as PCP - Advanced Heart Failure (Cardiology)  Patient Active Problem List   Diagnosis Date Noted  . Hematuria 02/15/2020  . CKD (chronic kidney disease), stage III 02/15/2020  . Constipation 02/13/2020  . Anemia 02/13/2020  . Secondary hypercoagulable state ([place]) 01/16/2020  . AKI (acute kidney injury) ([place]) 03/14/2019  . Spasm of muscle of lower back 03/14/2019  . Acute renal failure (ARF) ([place]) 03/09/2019  . Transaminitis   . Acute on chronic renal failure ([place]) 03/08/2019  . Acute exacerbation of CHF (congestive heart failure) ([place]) 11/23/2018  . Acute on chronic heart failure ([place]) 10/19/2018  . Pulmonary emphysema ([place])   . Chronic respiratory failure with hypoxia ([place])   . Morbid obesity ([place]) 09/09/2018  . URI (upper respiratory infection) 08/31/2018  . UTI (urinary tract infection) 08/31/2018  . Chronic systolic (congestive) heart failure ([place]) 08/28/2018  . CHF (congestive heart failure) ([place]) 08/14/2018  . Acute on chronic combined systolic and diastolic CHF (congestive heart failure) ([place]) 07/18/2018  . First degree AV block   . Shortness of breath 04/29/2018  . Acute HFrEF (heart failure with reduced ejection fraction) ([place]) 04/29/2018  . Acute respiratory failure with hypoxia ([place]) 09/27/2017  . CHF exacerbation ([place]) 09/27/2017  . Renal insufficiency   . Acute on chronic systolic CHF (congestive heart failure) ([place]) 07/08/2017  . Left knee pain 07/08/2017  . Acute bronchitis 05/10/2017  . Acute congestive heart failure ([place]) 04/14/2017  . Elevated troponin 04/14/2017  . Lactic acidosis   . Acute on chronic systolic heart failure, NYHA class 2 ([place]) 02/03/2017  . Diabetes mellitus type 2 in obese  ([place]) 02/03/2017  . CAP (community acquired pneumonia) 02/03/2017  . Lobar pneumonia ([place])   . Diabetes mellitus with complication ([place])   . Acute on chronic respiratory failure with hypoxia ([place])   . Acute on chronic combined systolic and diastolic heart failure ([place]) 05/19/2016  . Depression 05/19/2016  . Gout 05/19/2016  . 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 NSVT (nonsustained ventricular tachycardia) ([place])   . Chronic renal insufficiency, stage 3 (moderate) (HCC)   . Hyperkalemia 08/21/2015  . Pulmonary embolism ([place])   . Acute respiratory failure ([place])   . Osteoarthritis of left hip 10/25/2013  . Atrial fibrillation ([place]) 10/25/2013  . S/P hip replacement 10/24/2013  . Nonischemic cardiomyopathy ([place])   . Obesity (BMI 30-39.9)   . Essential hypertension   . Hyperlipidemia     Current Outpatient Medications:  .  allopurinol (ZYLOPRIM) 100 MG tablet, Take 200 mg by mouth daily. , Disp: , Rfl:  .  amiodarone (PACERONE) 200 MG tablet, Take 1 tablet (200 mg total) by mouth 2 (two) times daily for 8 days, THEN 1 tablet (200 mg total) daily., Disp: 46 tablet, Rfl: 0 .  apixaban (ELIQUIS) 5 MG TABS tablet, Take 1 tablet (5 mg total) by mouth 2 (two) times daily., Disp: 60 tablet, Rfl: 0 .  cyanocobalamin 1000 MCG tablet, Take 1 tablet (1,000 mcg total) by mouth daily., Disp: 90 tablet, Rfl: 0 .  diclofenac Sodium (VOLTAREN) 1 % GEL, Apply 4 g topically 4 (four) times daily., Disp: 50 g, Rfl: 0 .  finasteride (PROSCAR) 5 MG tablet, Take 1 tablet (5  mg total) by mouth daily., Disp: 30 tablet, Rfl: 0 .  glipiZIDE (GLUCOTROL) 5 MG tablet, Take 5 mg by mouth daily before breakfast., Disp: , Rfl:  .  hydrALAZINE (APRESOLINE) 25 MG tablet, Take 0.5 tablets (12.5 mg total) by mouth every 8 (eight) hours., Disp: 90 tablet, Rfl: 0 .  isosorbide mononitrate (IMDUR) 30 MG 24 hr tablet, Take 1 tablet (30 mg total) by mouth daily., Disp: 30 tablet, Rfl: 0 .   levothyroxine (SYNTHROID) 125 MCG tablet, Take 1 tablet (125 mcg total) by mouth daily before breakfast., Disp: 90 tablet, Rfl: 1 .  magnesium oxide (MAG-OX) 400 MG tablet, Take 400 mg by mouth daily., Disp: , Rfl:  .  polyethylene glycol (MIRALAX / GLYCOLAX) 17 g packet, Take 17 g by mouth 2 (two) times daily., Disp: 14 each, Rfl: 0 .  rosuvastatin (CRESTOR) 10 MG tablet, Take 1 tablet (10 mg total) by mouth daily at 6 PM., Disp: 30 tablet, Rfl: 5 .  sertraline (ZOLOFT) 25 MG tablet, Take 25 mg by mouth daily. , Disp: , Rfl:  .  spironolactone (ALDACTONE) 25 MG tablet, Take 0.5 tablets (12.5 mg total) by mouth daily. (Patient taking differently: Take 12.5 mg by mouth at bedtime. ), Disp: 30 tablet, Rfl: 0 .  sulfamethoxazole-trimethoprim (BACTRIM DS) 800-160 MG tablet, Take 1 tablet by mouth 2 (two) times daily., Disp: , Rfl:  .  torsemide (DEMADEX) 20 MG tablet, Take 80 mg in the AM (4 tablets) and then 40 mg in the Evening (2 Tablets) Daily, Disp: 180 tablet, Rfl: 0 .  bisacodyl (DULCOLAX) 10 MG suppository, Place 1 suppository (10 mg total) rectally daily as needed for moderate constipation. (Patient not taking: Reported on 02/25/2020), Disp: 12 suppository, Rfl: 0 .  cephALEXin (KEFLEX) 500 MG capsule, Take 1 capsule (500 mg total) by mouth 2 (two) times daily. (Patient not taking: Reported on 03/03/2020), Disp: 20 capsule, Rfl: 0 .  HYDROcodone-acetaminophen (NORCO/VICODIN) 5-325 MG tablet, Take 2 tablets by mouth every 4 (four) hours as needed. (Patient not taking: Reported on 02/25/2020), Disp: 6 tablet, Rfl: 0 .  ipratropium-albuterol (DUONEB) 0.5-2.5 (3) MG/3ML SOLN, Take 3 mLs by nebulization every 4 (four) hours as needed. (Patient not taking: Reported on 03/03/2020), Disp: 360 mL, Rfl: 0 .  methocarbamol (ROBAXIN) 500 MG tablet, Take 1 tablet (500 mg total) by mouth every 6 (six) hours as needed for muscle spasms. (Patient not taking: Reported on 03/03/2020), Disp: 30 tablet, Rfl: 0 .   senna-docusate (SENOKOT-S) 8.6-50 MG tablet, Take 1 tablet by mouth 2 (two) times daily. (Patient not taking: Reported on 03/03/2020), Disp: 30 tablet, Rfl: 0 .  tamsulosin (FLOMAX) 0.4 MG CAPS capsule, Take 0.4 mg by mouth at bedtime., Disp: , Rfl:  Allergies  Allergen Reactions  . Lisinopril Swelling    Facial swelling  . Xarelto [Rivaroxaban] Other (See Comments)    "Blood came out of my penis"      Social History   Socioeconomic History  . Marital status: Married    Spouse name: Not on file  . Number of children: Not on file  . Years of education: Not on file  . Highest education level: Not on file  Occupational History  . Occupation: Retired    Comment: Former Public house manager  Tobacco Use  . Smoking status: Former Smoker    Packs/day: 1.00    Years: 15.00    Pack years: 15.00    Types: Cigarettes, Cigars  . Smokeless tobacco: Former Neurosurgeon  . Tobacco  comment: quit smoking ~ 50 yr ago  Substance and Sexual Activity  . Alcohol use: No    Alcohol/week: 2.0 standard drinks    Types: 2 Cans of beer per week    Comment: rare beer.  . Drug use: No  . Sexual activity: Yes  Other Topics Concern  . Not on file  Social History Narrative   Lives in [place] with wife. Moved from [place] (2013). Does not routinely exercise.   Social Determinants of Health   Financial Resource Strain:   . Difficulty of Paying Living Expenses:   Food Insecurity:   . Worried About Charity fundraiser in the Last Year:   . Arboriculturist in the Last Year:   Transportation Needs:   . Film/video editor (Medical):   Marland Kitchen Lack of Transportation (Non-Medical):   Physical Activity:   . Days of Exercise per Week:   . Minutes of Exercise per Session:   Stress:   . Feeling of Stress :   Social Connections:   . Frequency of Communication with Friends and Family:   . Frequency of Social Gatherings with Friends and Family:   . Attends Religious Services:   . Active Member of Clubs or Organizations:   .  Attends Archivist Meetings:   Marland Kitchen Marital Status:   Intimate Partner Violence:   . Fear of Current or Ex-Partner:   . Emotionally Abused:   Marland Kitchen Physically Abused:   . Sexually Abused:     Physical Exam      Future Appointments  Date Time Provider [place]  03/11/2020  9:00 AM [facility] PA/NP [facility] None  03/21/2020  7:35 AM [facility] DEVICE REMOTES [facility]  03/27/2020 11:00 AM MC ECHO OP 1 [facility]  03/27/2020 12:00 PM Larey Dresser, MD [facility] None  06/20/2020  7:35 AM [facility] DEVICE REMOTES [facility]  09/19/2020  7:35 AM [facility] DEVICE REMOTES [facility]    BP 118/70   Pulse 78   Temp 98.2 F (36.8 C)   Resp 18   Wt 201 lb (91.2 kg)   SpO2 99%   BMI 31.48 kg/m   Weight yesterday-202  Pt reports he is feeling better. He is becoming more active. He has not heard back from home health agency about PT-they were waiting on approval from his PCP at [place].  He said he was not worried about it and he was able to do some activities here at home. He has been walking with his walker up and down the hall way several times and his legs are getting stronger. He has been able to weigh himself now so that's good. His urine color is improving. He reports doc called him and changed his antibiotic so he has been taking that.  He also reports dr Aundra Dubin office called and decreased his torsemide dosing. However I do not see any notes pertaining to this.  Wife reports they said no torsemide in afternoon only morning. I called clinic to verify but no one answered.  Jasmine called me back but she couldn't find any notes regarding that change either. So will continue the 80/40mg  of torsemide.  He has clinic appoint next week and they can check labs again.   he is out of the tamsulosin--was able to order that refill. He also ordered his pain med-hydrocodone and more sertraline.     Marylouise Stacks,  [facility] Paramedic  03/03/20

## 2020-03-04 ENCOUNTER — Encounter: Payer: No Typology Code available for payment source | Admitting: Internal Medicine

## 2020-03-10 ENCOUNTER — Telehealth (HOSPITAL_COMMUNITY): Payer: Self-pay | Admitting: Licensed Clinical Social Worker

## 2020-03-10 ENCOUNTER — Other Ambulatory Visit (HOSPITAL_COMMUNITY): Payer: Self-pay

## 2020-03-10 NOTE — Progress Notes (Signed)
Paramedicine Encounter    Patient ID: Ryan Macdonald, male    DOB: Dec 06, 1933, 86 y.o.   MRN: [id number]   Patient Care Team: Center, Bulger as PCP - General ([place]) Skeet Latch, MD as PCP - Cardiology (Cardiology) Larey Dresser, MD as PCP - Advanced Heart Failure (Cardiology)  Patient Active Problem List   Diagnosis Date Noted  . Hematuria 02/15/2020  . CKD (chronic kidney disease), stage III 02/15/2020  . Constipation 02/13/2020  . Anemia 02/13/2020  . Secondary hypercoagulable state ([place]) 01/16/2020  . AKI (acute kidney injury) ([place]) 03/14/2019  . Spasm of muscle of lower back 03/14/2019  . Acute renal failure (ARF) ([place]) 03/09/2019  . Transaminitis   . Acute on chronic renal failure ([place]) 03/08/2019  . Acute exacerbation of CHF (congestive heart failure) ([place]) 11/23/2018  . Acute on chronic heart failure ([place]) 10/19/2018  . Pulmonary emphysema ([place])   . Chronic respiratory failure with hypoxia ([place])   . Morbid obesity ([place]) 09/09/2018  . URI (upper respiratory infection) 08/31/2018  . UTI (urinary tract infection) 08/31/2018  . Chronic systolic (congestive) heart failure ([place]) 08/28/2018  . CHF (congestive heart failure) ([place]) 08/14/2018  . Acute on chronic combined systolic and diastolic CHF (congestive heart failure) ([place]) 07/18/2018  . First degree AV block   . Shortness of breath 04/29/2018  . Acute HFrEF (heart failure with reduced ejection fraction) ([place]) 04/29/2018  . Acute respiratory failure with hypoxia ([place]) 09/27/2017  . CHF exacerbation ([place]) 09/27/2017  . Renal insufficiency   . Acute on chronic systolic CHF (congestive heart failure) ([place]) 07/08/2017  . Left knee pain 07/08/2017  . Acute bronchitis 05/10/2017  . Acute congestive heart failure ([place]) 04/14/2017  . Elevated troponin 04/14/2017  . Lactic acidosis   . Acute on chronic systolic heart failure, NYHA class 2 ([place]) 02/03/2017  . Diabetes mellitus type 2 in obese  ([place]) 02/03/2017  . CAP (community acquired pneumonia) 02/03/2017  . Lobar pneumonia ([place])   . Diabetes mellitus with complication ([place])   . Acute on chronic respiratory failure with hypoxia ([place])   . Acute on chronic combined systolic and diastolic heart failure ([place]) 05/19/2016  . Depression 05/19/2016  . Gout 05/19/2016  . 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 NSVT (nonsustained ventricular tachycardia) ([place])   . Chronic renal insufficiency, stage 3 (moderate) (HCC)   . Hyperkalemia 08/21/2015  . Pulmonary embolism ([place])   . Acute respiratory failure ([place])   . Osteoarthritis of left hip 10/25/2013  . Atrial fibrillation ([place]) 10/25/2013  . S/P hip replacement 10/24/2013  . Nonischemic cardiomyopathy ([place])   . Obesity (BMI 30-39.9)   . Essential hypertension   . Hyperlipidemia     Current Outpatient Medications:  .  allopurinol (ZYLOPRIM) 100 MG tablet, Take 200 mg by mouth daily. , Disp: , Rfl:  .  amiodarone (PACERONE) 200 MG tablet, Take 1 tablet (200 mg total) by mouth 2 (two) times daily for 8 days, THEN 1 tablet (200 mg total) daily., Disp: 46 tablet, Rfl: 0 .  apixaban (ELIQUIS) 5 MG TABS tablet, Take 1 tablet (5 mg total) by mouth 2 (two) times daily., Disp: 60 tablet, Rfl: 0 .  cyanocobalamin 1000 MCG tablet, Take 1 tablet (1,000 mcg total) by mouth daily., Disp: 90 tablet, Rfl: 0 .  diclofenac Sodium (VOLTAREN) 1 % GEL, Apply 4 g topically 4 (four) times daily., Disp: 50 g, Rfl: 0 .  finasteride (PROSCAR) 5 MG tablet, Take 1 tablet (5  mg total) by mouth daily., Disp: 30 tablet, Rfl: 0 .  glipiZIDE (GLUCOTROL) 5 MG tablet, Take 5 mg by mouth daily before breakfast., Disp: , Rfl:  .  hydrALAZINE (APRESOLINE) 25 MG tablet, Take 0.5 tablets (12.5 mg total) by mouth every 8 (eight) hours., Disp: 90 tablet, Rfl: 0 .  HYDROcodone-acetaminophen (NORCO/VICODIN) 5-325 MG tablet, Take 2 tablets by mouth every 4 (four) hours as needed., Disp: 6 tablet,  Rfl: 0 .  ipratropium-albuterol (DUONEB) 0.5-2.5 (3) MG/3ML SOLN, Take 3 mLs by nebulization every 4 (four) hours as needed., Disp: 360 mL, Rfl: 0 .  isosorbide mononitrate (IMDUR) 30 MG 24 hr tablet, Take 1 tablet (30 mg total) by mouth daily., Disp: 30 tablet, Rfl: 0 .  levothyroxine (SYNTHROID) 125 MCG tablet, Take 1 tablet (125 mcg total) by mouth daily before breakfast., Disp: 90 tablet, Rfl: 1 .  magnesium oxide (MAG-OX) 400 MG tablet, Take 400 mg by mouth daily., Disp: , Rfl:  .  polyethylene glycol (MIRALAX / GLYCOLAX) 17 g packet, Take 17 g by mouth 2 (two) times daily., Disp: 14 each, Rfl: 0 .  rosuvastatin (CRESTOR) 10 MG tablet, Take 1 tablet (10 mg total) by mouth daily at 6 PM., Disp: 30 tablet, Rfl: 5 .  senna-docusate (SENOKOT-S) 8.6-50 MG tablet, Take 1 tablet by mouth 2 (two) times daily., Disp: 30 tablet, Rfl: 0 .  sertraline (ZOLOFT) 25 MG tablet, Take 25 mg by mouth daily. , Disp: , Rfl:  .  spironolactone (ALDACTONE) 25 MG tablet, Take 0.5 tablets (12.5 mg total) by mouth daily. (Patient taking differently: Take 12.5 mg by mouth at bedtime. ), Disp: 30 tablet, Rfl: 0 .  sulfamethoxazole-trimethoprim (BACTRIM DS) 800-160 MG tablet, Take 1 tablet by mouth 2 (two) times daily., Disp: , Rfl:  .  tamsulosin (FLOMAX) 0.4 MG CAPS capsule, Take 0.4 mg by mouth at bedtime., Disp: , Rfl:  .  torsemide (DEMADEX) 20 MG tablet, Take 80 mg in the AM (4 tablets) and then 40 mg in the Evening (2 Tablets) Daily, Disp: 180 tablet, Rfl: 0 .  bisacodyl (DULCOLAX) 10 MG suppository, Place 1 suppository (10 mg total) rectally daily as needed for moderate constipation. (Patient not taking: Reported on 02/25/2020), Disp: 12 suppository, Rfl: 0 .  cephALEXin (KEFLEX) 500 MG capsule, Take 1 capsule (500 mg total) by mouth 2 (two) times daily. (Patient not taking: Reported on 03/03/2020), Disp: 20 capsule, Rfl: 0 .  methocarbamol (ROBAXIN) 500 MG tablet, Take 1 tablet (500 mg total) by mouth every 6 (six)  hours as needed for muscle spasms. (Patient not taking: Reported on 03/03/2020), Disp: 30 tablet, Rfl: 0 Allergies  Allergen Reactions  . Lisinopril Swelling    Facial swelling  . Xarelto [Rivaroxaban] Other (See Comments)    "Blood came out of my penis"      Social History   Socioeconomic History  . Marital status: Married    Spouse name: Not on file  . Number of children: Not on file  . Years of education: Not on file  . Highest education level: Not on file  Occupational History  . Occupation: Retired    Comment: Former Corporate treasurer  Tobacco Use  . Smoking status: Former Smoker    Packs/day: 1.00    Years: 15.00    Pack years: 15.00    Types: Cigarettes, Cigars  . Smokeless tobacco: Former Systems developer  . Tobacco comment: quit smoking ~ 50 yr ago  Substance and Sexual Activity  . Alcohol use: No  Alcohol/week: 2.0 standard drinks    Types: 2 Cans of beer per week    Comment: rare beer.  . Drug use: No  . Sexual activity: Yes  Other Topics Concern  . Not on file  Social History Narrative   Lives in [place] with wife. Moved from [place] (2013). Does not routinely exercise.   Social Determinants of Health   Financial Resource Strain:   . Difficulty of Paying Living Expenses:   Food Insecurity:   . Worried About Programme researcher, broadcasting/film/video in the Last Year:   . Barista in the Last Year:   Transportation Needs:   . Freight forwarder (Medical):   Marland Kitchen Lack of Transportation (Non-Medical):   Physical Activity:   . Days of Exercise per Week:   . Minutes of Exercise per Session:   Stress:   . Feeling of Stress :   Social Connections:   . Frequency of Communication with Friends and Family:   . Frequency of Social Gatherings with Friends and Family:   . Attends Religious Services:   . Active Member of Clubs or Organizations:   . Attends Banker Meetings:   Marland Kitchen Marital Status:   Intimate Partner Violence:   . Fear of Current or Ex-Partner:   . Emotionally Abused:    Marland Kitchen Physically Abused:   . Sexually Abused:     Physical Exam      Future Appointments  Date Time Provider Department Center  03/11/2020  9:00 AM [facility] PA/NP [facility] None  03/21/2020  7:35 AM [facility] DEVICE REMOTES [facility]  03/27/2020 11:00 AM MC ECHO OP 1 [facility]ECH[facility]  03/27/2020 12:00 PM Laurey Morale, MD MC-[facility] None  06/20/2020  7:35 AM [facility] DEVICE REMOTES [facility]  09/19/2020  7:35 AM [facility] DEVICE REMOTES [facility]    BP 122/70   Pulse 82   Temp 98 F (36.7 C)   Resp 18   Wt 195 lb (88.5 kg)   SpO2 97%   BMI 30.54 kg/m   Weight yesterday-194 Last visit weight-201  Pt reports he is doing ok, he is in the living room today so he is being more active. He is not able to make it to clinic appoint tomor due to poor mobility. He still has not heard from home health agency yet--I believe they was waiting on PCP at [facility] to authorize it.  So we will have to work on figuring out how to get this started. Will relay this issue to jenna and see what can be done.-possible referral to [facility] for labs and PT/OT.    He said he would be able to get to his appointment on 4/29.  He is out of his tamsulosin. [facility] is [facility] to ask [facility] it and to order his amio as well. He asked about the home health nurse too but she said if he didn't hear back form home health then PCP hadnt signed off on it yet.  Weight is doing well. I watched him weigh this morning so the readings are accurate.  No edema to legs. He denies sob, no dizziness, no c/p.  meds verified and pill box refilled. His home aide was there today and she changed his compression stockings for him so he has been wearing that consistently.    -Debbie and heather with remote health called me later on in the day to report pt did not fit their criteria for the referral for him. They reported  he has to be in fluid overload to be admitted for their services.    [name], EMT-Paramedic [phone] [facility] Paramedic  03/10/20

## 2020-03-10 NOTE — Telephone Encounter (Signed)
CSW informed by [employer] that pt has appt tomorrow but unable to come due to mobility concerns.  Pt was recently in the hospital and discharged with home health referral to [facility] through the [place] but they have been unable to start seeing pt yet due to lack of approval by the [facility]  CSW spoke with pt who confirms he would be unable to get in a car safely- only entrance to his house has about 7-8 stairs so would also not be able to navigate with wheelchair transport- per pt it took 5 people to get him back into his home following discharge from the hospital.  Pt only support at home is his wife who just had knee surgery and can also not offer much physical support.  Pt is hopeful to get in home services and labs if possible instead of coming in for appt tomorrow since he isn't able to safely leave his home at this time though reports being able to manage.  CSW sent message to [facility] to request they look at pt to see if he might be appropriate for short term services as he needs more acute services like labs at this time.  CSW also spoke with RN at [facility], who confirms the delay on their end has been [place] approval and they have not had luck getting what they need from the [place].  State that pt could get services at no cost to him through his Medicare part A services if we would like to place new referral and cancel the [place] referral.  CSW discussed these options with pt who is agreeable to both- CSW awaiting response from [facility], will reach out to [place] tomorrow to try one more time to get things approved through them then will work on getting pt home health through Medicare benefit.  CSW will continue to follow and assist as needed  Burna Sis, LCSW Clinical Social Worker [facility] Desk#: [phone] Cell#: [phone]

## 2020-03-11 ENCOUNTER — Encounter (HOSPITAL_COMMUNITY): Payer: No Typology Code available for payment source

## 2020-03-11 ENCOUNTER — Telehealth (HOSPITAL_COMMUNITY): Payer: Self-pay | Admitting: Licensed Clinical Social Worker

## 2020-03-11 ENCOUNTER — Telehealth (HOSPITAL_COMMUNITY): Payer: Self-pay | Admitting: Cardiology

## 2020-03-11 DIAGNOSIS — I5022 Chronic systolic (congestive) heart failure: Secondary | ICD-10-CM

## 2020-03-11 NOTE — Telephone Encounter (Signed)
CSW called [facility] [place] to speak with pt PCP office regarding outstanding Home Health referral.  [facility] social worker reports that referral should go through pt Medicare benefit since he has Medicare and that they would not usually authorize home health services for pts with Medicare as an option.  CSW called [facility] to inform and had clinic staff fax over home health orders for PT/OT/RN/aid.  They report that is all they need at this time to move forward and will update CSW on referral progress.  Pt updated- CSW will continue to follow and assist as needed  Burna Sis, LCSW Clinical Social Worker [facility] Desk#: [phone] Cell#: [phone]

## 2020-03-11 NOTE — Telephone Encounter (Signed)
Per Rosetta Posner, LCSW Patient has been unable to get [facility] services started with [facility] benefits, will arrange with medicare benefits locally to expedite [facility] services for patient  Ok to place [facility], PT/OT per VO Brittainy Simmons,PA/Dr McLean  orders placed [facility] to [facility] [phone]

## 2020-03-12 ENCOUNTER — Telehealth (HOSPITAL_COMMUNITY): Payer: Self-pay | Admitting: Licensed Clinical Social Worker

## 2020-03-12 ENCOUNTER — Other Ambulatory Visit: Payer: Self-pay

## 2020-03-12 NOTE — Telephone Encounter (Signed)
CSW called [facility] to follow up on referral that was sent in yesterday.  They confirm they have everything they need and were able to switch it to be under Medicare.  Currently waiting for scheduler to set up visit- hopeful for initial visit tomorrow or Friday  CSW will continue to follow and assist as needed  Burna Sis, LCSW Clinical Social Worker [facility] Desk#: [phone] Cell#: [phone]

## 2020-03-14 DIAGNOSIS — I130 Hypertensive heart and chronic kidney disease with heart failure and stage 1 through stage 4 chronic kidney disease, or unspecified chronic kidney disease: Secondary | ICD-10-CM

## 2020-03-17 ENCOUNTER — Other Ambulatory Visit (HOSPITAL_COMMUNITY): Payer: Self-pay

## 2020-03-17 NOTE — Progress Notes (Signed)
Paramedicine Encounter    Patient ID: Ryan Macdonald, male    DOB: Dec 06, 1933, 86 y.o.   MRN: [id number]   Patient Care Team: Center, Bulger as PCP - General ([place]) Skeet Latch, MD as PCP - Cardiology (Cardiology) Larey Dresser, MD as PCP - Advanced Heart Failure (Cardiology)  Patient Active Problem List   Diagnosis Date Noted  . Hematuria 02/15/2020  . CKD (chronic kidney disease), stage III 02/15/2020  . Constipation 02/13/2020  . Anemia 02/13/2020  . Secondary hypercoagulable state ([place]) 01/16/2020  . AKI (acute kidney injury) ([place]) 03/14/2019  . Spasm of muscle of lower back 03/14/2019  . Acute renal failure (ARF) ([place]) 03/09/2019  . Transaminitis   . Acute on chronic renal failure ([place]) 03/08/2019  . Acute exacerbation of CHF (congestive heart failure) ([place]) 11/23/2018  . Acute on chronic heart failure ([place]) 10/19/2018  . Pulmonary emphysema ([place])   . Chronic respiratory failure with hypoxia ([place])   . Morbid obesity ([place]) 09/09/2018  . URI (upper respiratory infection) 08/31/2018  . UTI (urinary tract infection) 08/31/2018  . Chronic systolic (congestive) heart failure ([place]) 08/28/2018  . CHF (congestive heart failure) ([place]) 08/14/2018  . Acute on chronic combined systolic and diastolic CHF (congestive heart failure) ([place]) 07/18/2018  . First degree AV block   . Shortness of breath 04/29/2018  . Acute HFrEF (heart failure with reduced ejection fraction) ([place]) 04/29/2018  . Acute respiratory failure with hypoxia ([place]) 09/27/2017  . CHF exacerbation ([place]) 09/27/2017  . Renal insufficiency   . Acute on chronic systolic CHF (congestive heart failure) ([place]) 07/08/2017  . Left knee pain 07/08/2017  . Acute bronchitis 05/10/2017  . Acute congestive heart failure ([place]) 04/14/2017  . Elevated troponin 04/14/2017  . Lactic acidosis   . Acute on chronic systolic heart failure, NYHA class 2 ([place]) 02/03/2017  . Diabetes mellitus type 2 in obese  ([place]) 02/03/2017  . CAP (community acquired pneumonia) 02/03/2017  . Lobar pneumonia ([place])   . Diabetes mellitus with complication ([place])   . Acute on chronic respiratory failure with hypoxia ([place])   . Acute on chronic combined systolic and diastolic heart failure ([place]) 05/19/2016  . Depression 05/19/2016  . Gout 05/19/2016  . 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 NSVT (nonsustained ventricular tachycardia) ([place])   . Chronic renal insufficiency, stage 3 (moderate) (HCC)   . Hyperkalemia 08/21/2015  . Pulmonary embolism ([place])   . Acute respiratory failure ([place])   . Osteoarthritis of left hip 10/25/2013  . Atrial fibrillation ([place]) 10/25/2013  . S/P hip replacement 10/24/2013  . Nonischemic cardiomyopathy ([place])   . Obesity (BMI 30-39.9)   . Essential hypertension   . Hyperlipidemia     Current Outpatient Medications:  .  allopurinol (ZYLOPRIM) 100 MG tablet, Take 200 mg by mouth daily. , Disp: , Rfl:  .  amiodarone (PACERONE) 200 MG tablet, Take 1 tablet (200 mg total) by mouth 2 (two) times daily for 8 days, THEN 1 tablet (200 mg total) daily., Disp: 46 tablet, Rfl: 0 .  apixaban (ELIQUIS) 5 MG TABS tablet, Take 1 tablet (5 mg total) by mouth 2 (two) times daily., Disp: 60 tablet, Rfl: 0 .  cyanocobalamin 1000 MCG tablet, Take 1 tablet (1,000 mcg total) by mouth daily., Disp: 90 tablet, Rfl: 0 .  diclofenac Sodium (VOLTAREN) 1 % GEL, Apply 4 g topically 4 (four) times daily., Disp: 50 g, Rfl: 0 .  finasteride (PROSCAR) 5 MG tablet, Take 1 tablet (5  mg total) by mouth daily., Disp: 30 tablet, Rfl: 0 .  glipiZIDE (GLUCOTROL) 5 MG tablet, Take 5 mg by mouth daily before breakfast., Disp: , Rfl:  .  hydrALAZINE (APRESOLINE) 25 MG tablet, Take 0.5 tablets (12.5 mg total) by mouth every 8 (eight) hours., Disp: 90 tablet, Rfl: 0 .  HYDROcodone-acetaminophen (NORCO/VICODIN) 5-325 MG tablet, Take 2 tablets by mouth every 4 (four) hours as needed., Disp: 6 tablet,  Rfl: 0 .  ipratropium-albuterol (DUONEB) 0.5-2.5 (3) MG/3ML SOLN, Take 3 mLs by nebulization every 4 (four) hours as needed., Disp: 360 mL, Rfl: 0 .  isosorbide mononitrate (IMDUR) 30 MG 24 hr tablet, Take 1 tablet (30 mg total) by mouth daily., Disp: 30 tablet, Rfl: 0 .  levothyroxine (SYNTHROID) 125 MCG tablet, Take 1 tablet (125 mcg total) by mouth daily before breakfast., Disp: 90 tablet, Rfl: 1 .  magnesium oxide (MAG-OX) 400 MG tablet, Take 400 mg by mouth daily., Disp: , Rfl:  .  polyethylene glycol (MIRALAX / GLYCOLAX) 17 g packet, Take 17 g by mouth 2 (two) times daily., Disp: 14 each, Rfl: 0 .  rosuvastatin (CRESTOR) 10 MG tablet, Take 1 tablet (10 mg total) by mouth daily at 6 PM., Disp: 30 tablet, Rfl: 5 .  senna-docusate (SENOKOT-S) 8.6-50 MG tablet, Take 1 tablet by mouth 2 (two) times daily., Disp: 30 tablet, Rfl: 0 .  sertraline (ZOLOFT) 25 MG tablet, Take 25 mg by mouth daily. , Disp: , Rfl:  .  spironolactone (ALDACTONE) 25 MG tablet, Take 0.5 tablets (12.5 mg total) by mouth daily. (Patient taking differently: Take 12.5 mg by mouth at bedtime. ), Disp: 30 tablet, Rfl: 0 .  tamsulosin (FLOMAX) 0.4 MG CAPS capsule, Take 0.4 mg by mouth at bedtime., Disp: , Rfl:  .  torsemide (DEMADEX) 20 MG tablet, Take 80 mg in the AM (4 tablets) and then 40 mg in the Evening (2 Tablets) Daily, Disp: 180 tablet, Rfl: 0 .  bisacodyl (DULCOLAX) 10 MG suppository, Place 1 suppository (10 mg total) rectally daily as needed for moderate constipation. (Patient not taking: Reported on 02/25/2020), Disp: 12 suppository, Rfl: 0 .  cephALEXin (KEFLEX) 500 MG capsule, Take 1 capsule (500 mg total) by mouth 2 (two) times daily. (Patient not taking: Reported on 03/03/2020), Disp: 20 capsule, Rfl: 0 .  methocarbamol (ROBAXIN) 500 MG tablet, Take 1 tablet (500 mg total) by mouth every 6 (six) hours as needed for muscle spasms. (Patient not taking: Reported on 03/03/2020), Disp: 30 tablet, Rfl: 0 .   sulfamethoxazole-trimethoprim (BACTRIM DS) 800-160 MG tablet, Take 1 tablet by mouth 2 (two) times daily., Disp: , Rfl:  Allergies  Allergen Reactions  . Lisinopril Swelling    Facial swelling  . Xarelto [Rivaroxaban] Other (See Comments)    "Blood came out of my penis"      Social History   Socioeconomic History  . Marital status: Married    Spouse name: Not on file  . Number of children: Not on file  . Years of education: Not on file  . Highest education level: Not on file  Occupational History  . Occupation: Retired    Comment: Former Corporate treasurer  Tobacco Use  . Smoking status: Former Smoker    Packs/day: 1.00    Years: 15.00    Pack years: 15.00    Types: Cigarettes, Cigars  . Smokeless tobacco: Former Systems developer  . Tobacco comment: quit smoking ~ 50 yr ago  Substance and Sexual Activity  . Alcohol use: No  Alcohol/week: 2.0 standard drinks    Types: 2 Cans of beer per week    Comment: rare beer.  . Drug use: No  . Sexual activity: Yes  Other Topics Concern  . Not on file  Social History Narrative   Lives in [place] with wife. Moved from [place] (2013). Does not routinely exercise.   Social Determinants of Health   Financial Resource Strain:   . Difficulty of Paying Living Expenses:   Food Insecurity:   . Worried About Programme researcher, broadcasting/film/video in the Last Year:   . Barista in the Last Year:   Transportation Needs:   . Freight forwarder (Medical):   Marland Kitchen Lack of Transportation (Non-Medical):   Physical Activity:   . Days of Exercise per Week:   . Minutes of Exercise per Session:   Stress:   . Feeling of Stress :   Social Connections:   . Frequency of Communication with Friends and Family:   . Frequency of Social Gatherings with Friends and Family:   . Attends Religious Services:   . Active Member of Clubs or Organizations:   . Attends Banker Meetings:   Marland Kitchen Marital Status:   Intimate Partner Violence:   . Fear of Current or Ex-Partner:   .  Emotionally Abused:   Marland Kitchen Physically Abused:   . Sexually Abused:     Physical Exam      Future Appointments  Date Time Provider Department Center  03/21/2020  7:35 AM [facility] DEVICE REMOTES [facility]  03/27/2020 11:00 AM MC ECHO OP 1 [facility]  03/27/2020 12:00 PM Laurey Morale, MD [facility] None  06/20/2020  7:35 AM [facility] DEVICE REMOTES [facility]  09/19/2020  7:35 AM [facility] DEVICE REMOTES [facility]    BP 122/70   Pulse 84   Temp 98.4 F (36.9 C)   Resp 18   Wt 194 lb (88 kg)   SpO2 98%   BMI 30.38 kg/m   Weight other day-193 Last visit weight-195  Pt reports doing well. Nurse came out for first visit on Friday. PT will start this week.  He states he has been doing things at home to help him get stronger as well.  He denies increased sob, no dizziness, no edema noted. Weight chart looks good--195-193.  States his appetite is good. Sleeping good. No blood in urine. Urine color yellow. Lungs clear.  Got his tamsulosin in the mail finally so that was added back.  meds verified and pill box refilled.  PT nurse arrived was I was leaving.   [name], EMT-Paramedic [phone] [facility] Paramedic  03/17/20

## 2020-03-19 ENCOUNTER — Other Ambulatory Visit (HOSPITAL_COMMUNITY): Payer: No Typology Code available for payment source

## 2020-03-21 ENCOUNTER — Ambulatory Visit (INDEPENDENT_AMBULATORY_CARE_PROVIDER_SITE_OTHER): Payer: No Typology Code available for payment source | Admitting: *Deleted

## 2020-03-21 DIAGNOSIS — I5043 Acute on chronic combined systolic (congestive) and diastolic (congestive) heart failure: Secondary | ICD-10-CM | POA: Diagnosis not present

## 2020-03-21 LAB — CUP PACEART REMOTE DEVICE CHECK
Date Time Interrogation Session: 20210423052611
Implantable Lead Implant Date: 20161221
Implantable Lead Location: 753860
Implantable Lead Model: 365
Implantable Lead Serial Number: 10600411
Implantable Pulse Generator Implant Date: 20161221
Pulse Gen Model: 399436
Pulse Gen Serial Number: 60886010

## 2020-03-21 NOTE — Progress Notes (Signed)
ICD Remote  

## 2020-03-24 ENCOUNTER — Telehealth (HOSPITAL_COMMUNITY): Payer: Self-pay | Admitting: *Deleted

## 2020-03-24 ENCOUNTER — Other Ambulatory Visit (HOSPITAL_COMMUNITY): Payer: Self-pay

## 2020-03-24 NOTE — Telephone Encounter (Signed)
Katie w/paramedicine called to report pts weight is up 6lbs in 1 week and he has BLEE. Per Johnette Abraham take torsemide 80mg  in the AM and 60mg  in the PM and keep follow up appt Thursday. Katie aware and will communicate with patient.

## 2020-03-24 NOTE — Progress Notes (Signed)
Paramedicine Encounter    Patient ID: Ryan Macdonald, male    DOB: Dec 06, 1933, 86 y.o.   MRN: [id number]   Patient Care Team: Center, Bulger as PCP - General ([place]) Skeet Latch, MD as PCP - Cardiology (Cardiology) Larey Dresser, MD as PCP - Advanced Heart Failure (Cardiology)  Patient Active Problem List   Diagnosis Date Noted  . Hematuria 02/15/2020  . CKD (chronic kidney disease), stage III 02/15/2020  . Constipation 02/13/2020  . Anemia 02/13/2020  . Secondary hypercoagulable state ([place]) 01/16/2020  . AKI (acute kidney injury) ([place]) 03/14/2019  . Spasm of muscle of lower back 03/14/2019  . Acute renal failure (ARF) ([place]) 03/09/2019  . Transaminitis   . Acute on chronic renal failure ([place]) 03/08/2019  . Acute exacerbation of CHF (congestive heart failure) ([place]) 11/23/2018  . Acute on chronic heart failure ([place]) 10/19/2018  . Pulmonary emphysema ([place])   . Chronic respiratory failure with hypoxia ([place])   . Morbid obesity ([place]) 09/09/2018  . URI (upper respiratory infection) 08/31/2018  . UTI (urinary tract infection) 08/31/2018  . Chronic systolic (congestive) heart failure ([place]) 08/28/2018  . CHF (congestive heart failure) ([place]) 08/14/2018  . Acute on chronic combined systolic and diastolic CHF (congestive heart failure) ([place]) 07/18/2018  . First degree AV block   . Shortness of breath 04/29/2018  . Acute HFrEF (heart failure with reduced ejection fraction) ([place]) 04/29/2018  . Acute respiratory failure with hypoxia ([place]) 09/27/2017  . CHF exacerbation ([place]) 09/27/2017  . Renal insufficiency   . Acute on chronic systolic CHF (congestive heart failure) ([place]) 07/08/2017  . Left knee pain 07/08/2017  . Acute bronchitis 05/10/2017  . Acute congestive heart failure ([place]) 04/14/2017  . Elevated troponin 04/14/2017  . Lactic acidosis   . Acute on chronic systolic heart failure, NYHA class 2 ([place]) 02/03/2017  . Diabetes mellitus type 2 in obese  ([place]) 02/03/2017  . CAP (community acquired pneumonia) 02/03/2017  . Lobar pneumonia ([place])   . Diabetes mellitus with complication ([place])   . Acute on chronic respiratory failure with hypoxia ([place])   . Acute on chronic combined systolic and diastolic heart failure ([place]) 05/19/2016  . Depression 05/19/2016  . Gout 05/19/2016  . 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 NSVT (nonsustained ventricular tachycardia) ([place])   . Chronic renal insufficiency, stage 3 (moderate) (HCC)   . Hyperkalemia 08/21/2015  . Pulmonary embolism ([place])   . Acute respiratory failure ([place])   . Osteoarthritis of left hip 10/25/2013  . Atrial fibrillation ([place]) 10/25/2013  . S/P hip replacement 10/24/2013  . Nonischemic cardiomyopathy ([place])   . Obesity (BMI 30-39.9)   . Essential hypertension   . Hyperlipidemia     Current Outpatient Medications:  .  allopurinol (ZYLOPRIM) 100 MG tablet, Take 200 mg by mouth daily. , Disp: , Rfl:  .  amiodarone (PACERONE) 200 MG tablet, Take 1 tablet (200 mg total) by mouth 2 (two) times daily for 8 days, THEN 1 tablet (200 mg total) daily., Disp: 46 tablet, Rfl: 0 .  apixaban (ELIQUIS) 5 MG TABS tablet, Take 1 tablet (5 mg total) by mouth 2 (two) times daily., Disp: 60 tablet, Rfl: 0 .  cyanocobalamin 1000 MCG tablet, Take 1 tablet (1,000 mcg total) by mouth daily., Disp: 90 tablet, Rfl: 0 .  diclofenac Sodium (VOLTAREN) 1 % GEL, Apply 4 g topically 4 (four) times daily., Disp: 50 g, Rfl: 0 .  finasteride (PROSCAR) 5 MG tablet, Take 1 tablet (5  mg total) by mouth daily., Disp: 30 tablet, Rfl: 0 .  glipiZIDE (GLUCOTROL) 5 MG tablet, Take 5 mg by mouth daily before breakfast., Disp: , Rfl:  .  hydrALAZINE (APRESOLINE) 25 MG tablet, Take 0.5 tablets (12.5 mg total) by mouth every 8 (eight) hours., Disp: 90 tablet, Rfl: 0 .  HYDROcodone-acetaminophen (NORCO/VICODIN) 5-325 MG tablet, Take 2 tablets by mouth every 4 (four) hours as needed., Disp: 6 tablet,  Rfl: 0 .  ipratropium-albuterol (DUONEB) 0.5-2.5 (3) MG/3ML SOLN, Take 3 mLs by nebulization every 4 (four) hours as needed., Disp: 360 mL, Rfl: 0 .  isosorbide mononitrate (IMDUR) 30 MG 24 hr tablet, Take 1 tablet (30 mg total) by mouth daily., Disp: 30 tablet, Rfl: 0 .  levothyroxine (SYNTHROID) 125 MCG tablet, Take 1 tablet (125 mcg total) by mouth daily before breakfast., Disp: 90 tablet, Rfl: 1 .  magnesium oxide (MAG-OX) 400 MG tablet, Take 400 mg by mouth daily., Disp: , Rfl:  .  polyethylene glycol (MIRALAX / GLYCOLAX) 17 g packet, Take 17 g by mouth 2 (two) times daily., Disp: 14 each, Rfl: 0 .  rosuvastatin (CRESTOR) 10 MG tablet, Take 1 tablet (10 mg total) by mouth daily at 6 PM., Disp: 30 tablet, Rfl: 5 .  senna-docusate (SENOKOT-S) 8.6-50 MG tablet, Take 1 tablet by mouth 2 (two) times daily., Disp: 30 tablet, Rfl: 0 .  sertraline (ZOLOFT) 25 MG tablet, Take 25 mg by mouth daily. , Disp: , Rfl:  .  spironolactone (ALDACTONE) 25 MG tablet, Take 0.5 tablets (12.5 mg total) by mouth daily. (Patient taking differently: Take 12.5 mg by mouth at bedtime. ), Disp: 30 tablet, Rfl: 0 .  tamsulosin (FLOMAX) 0.4 MG CAPS capsule, Take 0.4 mg by mouth at bedtime., Disp: , Rfl:  .  torsemide (DEMADEX) 20 MG tablet, Take 80 mg in the AM (4 tablets) and then 40 mg in the Evening (2 Tablets) Daily, Disp: 180 tablet, Rfl: 0 .  bisacodyl (DULCOLAX) 10 MG suppository, Place 1 suppository (10 mg total) rectally daily as needed for moderate constipation. (Patient not taking: Reported on 02/25/2020), Disp: 12 suppository, Rfl: 0 .  cephALEXin (KEFLEX) 500 MG capsule, Take 1 capsule (500 mg total) by mouth 2 (two) times daily. (Patient not taking: Reported on 03/03/2020), Disp: 20 capsule, Rfl: 0 .  methocarbamol (ROBAXIN) 500 MG tablet, Take 1 tablet (500 mg total) by mouth every 6 (six) hours as needed for muscle spasms. (Patient not taking: Reported on 03/03/2020), Disp: 30 tablet, Rfl: 0 .   sulfamethoxazole-trimethoprim (BACTRIM DS) 800-160 MG tablet, Take 1 tablet by mouth 2 (two) times daily., Disp: , Rfl:  Allergies  Allergen Reactions  . Lisinopril Swelling    Facial swelling  . Xarelto [Rivaroxaban] Other (See Comments)    "Blood came out of my penis"      Social History   Socioeconomic History  . Marital status: Married    Spouse name: Not on file  . Number of children: Not on file  . Years of education: Not on file  . Highest education level: Not on file  Occupational History  . Occupation: Retired    Comment: Former Corporate treasurer  Tobacco Use  . Smoking status: Former Smoker    Packs/day: 1.00    Years: 15.00    Pack years: 15.00    Types: Cigarettes, Cigars  . Smokeless tobacco: Former Systems developer  . Tobacco comment: quit smoking ~ 50 yr ago  Substance and Sexual Activity  . Alcohol use: No  Alcohol/week: 2.0 standard drinks    Types: 2 Cans of beer per week    Comment: rare beer.  . Drug use: No  . Sexual activity: Yes  Other Topics Concern  . Not on file  Social History Narrative   Lives in [place] with wife. Moved from [place] (2013). Does not routinely exercise.   Social Determinants of Health   Financial Resource Strain:   . Difficulty of Paying Living Expenses:   Food Insecurity:   . Worried About Charity fundraiser in the Last Year:   . Arboriculturist in the Last Year:   Transportation Needs:   . Film/video editor (Medical):   Marland Kitchen Lack of Transportation (Non-Medical):   Physical Activity:   . Days of Exercise per Week:   . Minutes of Exercise per Session:   Stress:   . Feeling of Stress :   Social Connections:   . Frequency of Communication with Friends and Family:   . Frequency of Social Gatherings with Friends and Family:   . Attends Religious Services:   . Active Member of Clubs or Organizations:   . Attends Archivist Meetings:   Marland Kitchen Marital Status:   Intimate Partner Violence:   . Fear of Current or Ex-Partner:   .  Emotionally Abused:   Marland Kitchen Physically Abused:   . Sexually Abused:     Physical Exam      Future Appointments  Date Time Provider [place]  03/27/2020 11:00 AM MC ECHO OP 1 MC-E[facility]  03/27/2020 12:00 PM Larey Dresser, MD [facility] None  05/19/2020 10:45 AM Edrick Kins, DPM [facility]  06/20/2020  7:35 AM [facility] DEVICE REMOTES [facility]  09/19/2020  7:35 AM [facility] DEVICE REMOTES [facility]    BP 118/70   Pulse 92   Temp 98.2 F (36.8 C)   Resp 16   Wt 200 lb (90.7 kg)   SpO2 97%   BMI 31.32 kg/m  Weight 4/20-194 Weight 4/21-197 Last weight on 4/22-199 Last visit weight-194  Pt reports he is doing well, he met me sitting at the table in the kitchen, not in the back bedroom like hes been doing.  He denies increased sob, no dizziness, no c/p.  He has not weighed in a few days as noted above.  His weight is inching up. hes up 6lbs from last week.  Urine output well. Dark yellow in color.  I tried talking to him about the foods he is eating has high sodium in it, but he was getting irritated . He did partake in popeyes chicken recently.  Spoke to jasmine who spoke to brittany about further instructions-80/60of torsemide 2 days. So that was done.   meds verified and pill box refilled.    Marylouise Stacks, [facility] Paramedic  03/24/20

## 2020-03-27 ENCOUNTER — Ambulatory Visit (HOSPITAL_COMMUNITY)
Admission: RE | Admit: 2020-03-27 | Discharge: 2020-03-27 | Disposition: A | Discharge status: Home / Self Care | Payer: No Typology Code available for payment source | Source: Ambulatory Visit | Attending: Cardiology | Admitting: Cardiology

## 2020-03-27 ENCOUNTER — Ambulatory Visit (HOSPITAL_BASED_OUTPATIENT_CLINIC_OR_DEPARTMENT_OTHER)
Admission: RE | Admit: 2020-03-27 | Discharge: 2020-03-27 | Disposition: A | Discharge status: Home / Self Care | Payer: No Typology Code available for payment source | Source: Ambulatory Visit | Attending: Cardiology | Admitting: Cardiology

## 2020-03-27 ENCOUNTER — Other Ambulatory Visit: Payer: Self-pay

## 2020-03-27 ENCOUNTER — Other Ambulatory Visit (HOSPITAL_COMMUNITY): Payer: Self-pay

## 2020-03-27 VITALS — BP 96/56 | HR 82 | Wt 209.5 lb

## 2020-03-27 DIAGNOSIS — E1122 Type 2 diabetes mellitus with diabetic chronic kidney disease: Secondary | ICD-10-CM | POA: Diagnosis not present

## 2020-03-27 DIAGNOSIS — Z955 Presence of coronary angioplasty implant and graft: Secondary | ICD-10-CM | POA: Diagnosis not present

## 2020-03-27 DIAGNOSIS — E039 Hypothyroidism, unspecified: Secondary | ICD-10-CM | POA: Diagnosis not present

## 2020-03-27 DIAGNOSIS — Z8249 Family history of ischemic heart disease and other diseases of the circulatory system: Secondary | ICD-10-CM | POA: Diagnosis not present

## 2020-03-27 DIAGNOSIS — I428 Other cardiomyopathies: Secondary | ICD-10-CM | POA: Insufficient documentation

## 2020-03-27 DIAGNOSIS — Z7902 Long term (current) use of antithrombotics/antiplatelets: Secondary | ICD-10-CM | POA: Diagnosis not present

## 2020-03-27 DIAGNOSIS — E785 Hyperlipidemia, unspecified: Secondary | ICD-10-CM | POA: Diagnosis not present

## 2020-03-27 DIAGNOSIS — Z7901 Long term (current) use of anticoagulants: Secondary | ICD-10-CM | POA: Diagnosis not present

## 2020-03-27 DIAGNOSIS — Z79899 Other long term (current) drug therapy: Secondary | ICD-10-CM | POA: Diagnosis not present

## 2020-03-27 DIAGNOSIS — I5022 Chronic systolic (congestive) heart failure: Secondary | ICD-10-CM

## 2020-03-27 DIAGNOSIS — I2510 Atherosclerotic heart disease of native coronary artery without angina pectoris: Secondary | ICD-10-CM | POA: Insufficient documentation

## 2020-03-27 DIAGNOSIS — I480 Paroxysmal atrial fibrillation: Secondary | ICD-10-CM

## 2020-03-27 DIAGNOSIS — N183 Chronic kidney disease, stage 3 unspecified: Secondary | ICD-10-CM | POA: Diagnosis not present

## 2020-03-27 DIAGNOSIS — Z7984 Long term (current) use of oral hypoglycemic drugs: Secondary | ICD-10-CM | POA: Diagnosis not present

## 2020-03-27 DIAGNOSIS — J90 Pleural effusion, not elsewhere classified: Secondary | ICD-10-CM | POA: Diagnosis not present

## 2020-03-27 DIAGNOSIS — J9611 Chronic respiratory failure with hypoxia: Secondary | ICD-10-CM | POA: Insufficient documentation

## 2020-03-27 DIAGNOSIS — M109 Gout, unspecified: Secondary | ICD-10-CM | POA: Diagnosis not present

## 2020-03-27 DIAGNOSIS — R05 Cough: Secondary | ICD-10-CM | POA: Diagnosis present

## 2020-03-27 DIAGNOSIS — Z87891 Personal history of nicotine dependence: Secondary | ICD-10-CM | POA: Insufficient documentation

## 2020-03-27 DIAGNOSIS — Z95810 Presence of automatic (implantable) cardiac defibrillator: Secondary | ICD-10-CM | POA: Insufficient documentation

## 2020-03-27 DIAGNOSIS — I130 Hypertensive heart and chronic kidney disease with heart failure and stage 1 through stage 4 chronic kidney disease, or unspecified chronic kidney disease: Secondary | ICD-10-CM | POA: Insufficient documentation

## 2020-03-27 LAB — COMPREHENSIVE METABOLIC PANEL
ALT: 29 U/L (ref 0–44)
AST: 53 U/L — ABNORMAL HIGH (ref 15–41)
Albumin: 2.6 g/dL — ABNORMAL LOW (ref 3.5–5.0)
Alkaline Phosphatase: 77 U/L (ref 38–126)
Anion gap: 11 (ref 5–15)
BUN: 58 mg/dL — ABNORMAL HIGH (ref 8–23)
CO2: 27 mmol/L (ref 22–32)
Calcium: 9.2 mg/dL (ref 8.9–10.3)
Chloride: 99 mmol/L (ref 98–111)
Creatinine, Ser: 1.72 mg/dL — ABNORMAL HIGH (ref 0.61–1.24)
GFR calc Af Amer: 42 mL/min — ABNORMAL LOW (ref >60)
GFR calc non Af Amer: 36 mL/min — ABNORMAL LOW (ref >60)
Glucose, Bld: 68 mg/dL — ABNORMAL LOW (ref 70–99)
Potassium: 4.2 mmol/L (ref 3.5–5.1)
Sodium: 137 mmol/L (ref 135–145)
Total Bilirubin: 0.7 mg/dL (ref 0.3–1.2)
Total Protein: 7.9 g/dL (ref 6.5–8.1)

## 2020-03-27 LAB — TSH: TSH: 1.665 u[IU]/mL (ref 0.350–4.500)

## 2020-03-27 LAB — CBC
HCT: 27.4 % — ABNORMAL LOW (ref 39.0–52.0)
Hemoglobin: 8.5 g/dL — ABNORMAL LOW (ref 13.0–17.0)
MCH: 29.6 pg (ref 26.0–34.0)
MCHC: 31 g/dL (ref 30.0–36.0)
MCV: 95.5 fL (ref 80.0–100.0)
Platelets: 262 10*3/uL (ref 150–400)
RBC: 2.87 MIL/uL — ABNORMAL LOW (ref 4.22–5.81)
RDW: 19.5 % — ABNORMAL HIGH (ref 11.5–15.5)
WBC: 6.3 10*3/uL (ref 4.0–10.5)
nRBC: 0 % (ref 0.0–0.2)

## 2020-03-27 MED ORDER — PANTOPRAZOLE SODIUM 40 MG PO TBEC: 40.0000 mg | DELAYED_RELEASE_TABLET | Freq: Every day | ORAL | 11 refills | Status: DC

## 2020-03-27 MED ORDER — APIXABAN 2.5 MG PO TABS: 2.5000 mg | ORAL_TABLET | Freq: Two times a day (BID) | ORAL | Status: DC

## 2020-03-27 NOTE — Progress Notes (Signed)
Came out today for med rec post clinic visit with med change of eliquis to back to the 2.5 BID.  Fixed in pill box. Will see him again on Monday.    [name], EMT-Paramedic  03/27/20

## 2020-03-27 NOTE — Progress Notes (Signed)
Paramedicine Encounter   Patient ID: Ryan Macdonald , male,   DOB: 12-21-1933,85 y.o.,  MRN: [id number]   Met patient in clinic today with provider.  Pt had ECHO prior to appointment today.   Weight @ clinic-209 Last clinic weight-197 B/p-96/56 Weight @ home-200 HR-82 SP02-96%  Pt denies any issues or complaints with anything.  Weight is up from clinic appoint in [place].  Pt wears compression stockings every day.  He did notice increased urine output those couple days his torsemide was increased.  Urine color golden yellow in color per pt.  He c/o cough off and on--mclean is going to try him protonix to see if its acid reflux.  eliquis needs to be back on 2.6m BID.  Per ECHO fluid level is good today.  EKG NS today. Chest xray today--sounds like some fluid at base of lung-- Will come back in a month to clinic.   KMarylouise Stacks ETyrone4/29/2021

## 2020-03-27 NOTE — Patient Instructions (Signed)
Decrease Eliquis to 2.5 mg (1/2 tab) Twice daily   Start Protonix 40 mg daily  Labs done today, we will call you for abnormal results  Chest X-Ray needed today  Your physician recommends that you schedule a follow-up appointment in: 1 month  If you have any questions or concerns before your next appointment please send [place] a message through [place] or call our office at [phone].  At the [facility], you and your health needs are our priority. As part of our continuing mission to provide you with exceptional heart care, we have created designated Provider Care Teams. These Care Teams include your primary Cardiologist (physician) and Advanced Practice Providers (APPs- Physician Assistants and Nurse Practitioners) who all work together to provide you with the care you need, when you need it.   You may see any of the following providers on your designated Care Team at your next follow up: Marland Kitchen Dr Arvilla Meres . Dr [name] . Tonye Becket, NP . Robbie Lis, PA . Karle Plumber, PharmD   Please be sure to bring in all your medications bottles to every appointment.

## 2020-03-27 NOTE — Progress Notes (Signed)
  Echocardiogram 2D Echocardiogram has been performed.  Ryan Macdonald 03/27/2020, 11:50 AM

## 2020-03-28 NOTE — Progress Notes (Signed)
Date:  03/28/2020   ID:  Ryan Macdonald, DOB [birth year], MRN [id number]   Provider location: [place] [facility] Type of Visit: Established patient   PCP:  [facility], [facility]; Dr. Tereasa Coop  Cardiologist:  Dr. Shirlee Latch   History of Present Illness: Ryan Macdonald is a 86 y.o. male who has a history ofsystolic HF due toNICM, Biotronic ICD, A Fibon Eliquis, DMII, HTN,CAD s/p pLAD stent in 07/2018 with residual non-obstructive disease,chronic hypoxic respiratory failure on home O2 PRN,and depression.  Admitted 12/26 - 11/28/18 with A/C systolic CHF. Diuresed 21 lbs and meds adjusted as tolerated. Echo showed EF 20-25%. EP was consulted for RV pacing and ICD settings were adjusted => patient was reprogrammed to VDD, now with narrow QRS with pacing.   Patient was admitted in 3/21 with CHF, AKI and E coli UTI.  He had a prolonged admission, was diuresed on milrinone gtt + Lasix gtt.  Amiodarone was restarted to keep him in NSR.   Echo was done today and reviewed, EF 25% with moderate LV dilation and inferior/inferolateral AK; mildly decreased RV systolic function; moderate MR.   Patient returns for followup of CHF.  Weight is up but he says that he has been eating a lot recently.  He has a chronic cough.  No dyspnea walking on flat ground.  Uses a cane for balance.  No chest pain.  No orthopnea/PND.  No lightheadedness or syncope.   Labs (4/20): K 4.1, creatinine 2.23, LDL 58 Labs (6/20): TSH 43, K 4.6, creatinine 1.67 Labs (8/20): K 5.1, creatinine 1.66 Labs (10/20): K 4.9 => 5, creatinine 1.89 => 1.5, LDL 65, HDL 32, hgb 10.4 Labs (3/21): K 3.7, creatinine 2  ECG (personally reviewed): NSR, 1st degree AVB 268 msec, anterior T wave flattening  PMH: 1. Atrial fibrillation: Paroxysmal. 2. CKD: Stage 3. 3. Type 2 diabetes 4. Hyperlipidemia 5. HTN 6. Nephrolithiasis 7. ?NAFLD 8. Gout  9. CAD: LHC (9/19) with 40% distal LM, 50% ostial LAD, 50% mid LAD, 75%  proximal LAD treated with DES.  10. Chronic hypoxemic respiratory failure. Has home oxygen. ?COPD: Prior smoker.  11. CHF: EF low since 2014. Primarily nonischemic cardiomyopathy. Biotronik ICD. - Echo (12/19) with EF 20-25%, diffuse hypokinesis, moderate MR, mildly dilated RV with moderately decreased systolic function, PASP 39 mmHg.  - ACEI angioedema.  - Echo (4/21): EF 25% with moderate LV dilation and inferior/inferolateral AK; mildly decreased RV systolic function; moderate MR.  12. Hypothyroidism 13. Fe deficiency anemia.    Current Outpatient Medications  Medication Sig Dispense Refill  . allopurinol (ZYLOPRIM) 100 MG tablet Take 200 mg by mouth daily.     Marland Kitchen amiodarone (PACERONE) 200 MG tablet Take 200 mg by mouth daily.    Marland Kitchen apixaban (ELIQUIS) 2.5 MG TABS tablet Take 1 tablet (2.5 mg total) by mouth 2 (two) times daily.    . bisacodyl (DULCOLAX) 10 MG suppository Place 1 suppository (10 mg total) rectally daily as needed for moderate constipation. 12 suppository 0  . cyanocobalamin 1000 MCG tablet Take 1 tablet (1,000 mcg total) by mouth daily. 90 tablet 0  . diclofenac Sodium (VOLTAREN) 1 % GEL Apply 4 g topically 4 (four) times daily. 50 g 0  . finasteride (PROSCAR) 5 MG tablet Take 1 tablet (5 mg total) by mouth daily. 30 tablet 0  . glipiZIDE (GLUCOTROL) 5 MG tablet Take 5 mg by mouth daily before breakfast.    . hydrALAZINE (APRESOLINE) 25 MG tablet Take 0.5  tablets (12.5 mg total) by mouth every 8 (eight) hours. 90 tablet 0  . HYDROcodone-acetaminophen (NORCO/VICODIN) 5-325 MG tablet Take 2 tablets by mouth every 4 (four) hours as needed. 6 tablet 0  . ipratropium-albuterol (DUONEB) 0.5-2.5 (3) MG/3ML SOLN Take 3 mLs by nebulization every 4 (four) hours as needed. 360 mL 0  . isosorbide mononitrate (IMDUR) 30 MG 24 hr tablet Take 1 tablet (30 mg total) by mouth daily. 30 tablet 0  . levothyroxine (SYNTHROID) 125 MCG tablet Take 1 tablet (125 mcg total) by mouth daily before  breakfast. 90 tablet 1  . magnesium oxide (MAG-OX) 400 MG tablet Take 400 mg by mouth daily.    . methocarbamol (ROBAXIN) 500 MG tablet Take 1 tablet (500 mg total) by mouth every 6 (six) hours as needed for muscle spasms. 30 tablet 0  . polyethylene glycol (MIRALAX / GLYCOLAX) 17 g packet Take 17 g by mouth 2 (two) times daily. 14 each 0  . rosuvastatin (CRESTOR) 10 MG tablet Take 1 tablet (10 mg total) by mouth daily at 6 PM. 30 tablet 5  . senna-docusate (SENOKOT-S) 8.6-50 MG tablet Take 1 tablet by mouth 2 (two) times daily. 30 tablet 0  . sertraline (ZOLOFT) 25 MG tablet Take 25 mg by mouth daily.     Marland Kitchen spironolactone (ALDACTONE) 25 MG tablet Take 12.5 mg by mouth at bedtime.    . tamsulosin (FLOMAX) 0.4 MG CAPS capsule Take 0.4 mg by mouth at bedtime.    . torsemide (DEMADEX) 20 MG tablet Take 80 mg in the AM (4 tablets) and then 40 mg in the Evening (2 Tablets) Daily 180 tablet 0  . pantoprazole (PROTONIX) 40 MG tablet Take 1 tablet (40 mg total) by mouth daily. 30 tablet 11   No current facility-administered medications for this encounter.    Allergies:   Lisinopril and Xarelto [rivaroxaban]   Social History:  The patient  reports that he has quit smoking. His smoking use included cigarettes and cigars. He has a 15.00 pack-year smoking history. He has quit using smokeless tobacco. He reports that he does not drink alcohol or use drugs.   Family History:  The patient's family history includes Heart Problems in his maternal grandmother; Heart disease in his mother; Other in an other family member.   ROS:  Please see the history of present illness.   All other systems are personally reviewed and negative.   Exam:  BP (!) 96/56   Pulse 82   Wt 95 kg (209 lb 8 oz)   SpO2 96%   BMI 32.81 kg/m  General: NAD Neck: No JVD, no thyromegaly or thyroid nodule.  Lungs: Decreased BS at bases R>L CV: Nondisplaced PMI.  Heart regular S1/S2, no S3/S4, no murmur.  1+ edema 1/3 to knees  bilaterally.  No carotid bruit.  Normal pedal pulses.  Abdomen: Soft, nontender, no hepatosplenomegaly, no distention.  Skin: Intact without lesions or rashes.  Neurologic: Alert and oriented x 3.  Psych: Normal affect. Extremities: No clubbing or cyanosis.  HEENT: Normal.   Recent Labs: 02/11/2020: B Natriuretic Peptide 1,569.0 02/24/2020: Magnesium 1.7 03/27/2020: ALT 29; BUN 58; Creatinine, Ser 1.72; Hemoglobin 8.5; Platelets 262; Potassium 4.2; Sodium 137; TSH 1.665  Personally reviewed   Wt Readings from Last 3 Encounters:  03/27/20 95 kg (209 lb 8 oz)  03/24/20 90.7 kg (200 lb)  03/17/20 88 kg (194 lb)    ASSESSMENT AND PLAN:  1. Chronic systolic CHF: Primarily nonischemic cardiomyopathy. EF has been  low since at least 2014.  He has single chamber Biotronik ICD. QRS not wide enough for CRT.Settings adjusted in the past to prevent RV pacing in the face of a long 1st degree AV block, he is not pacing today.  Echo today showed EF 25% with moderate LV dilation and inferior/inferolateral AK; mildly decreased RV systolic function; moderate MR.  NYHA class II symptoms.  Weight is up but he does not appear significantly volume overloaded.  - CXR done today due to concern for effusion on exam => no significant pleural effusion noted.      - Continue torsemide 80 qam/40 qpm, BMET today.     - Continue spironolactone 12.5 mg qhs.   - Continue hydralazine 12.5 mg tid and Imdur 30 mg daily.  No BP room for titration.   -Have been holding off on beta blocker given long 1st degree AVB to limit RV pacing.  -No ACEI or ARNI with angioedema on lisinopril. - Would hold off on ARB with CKD stage 3 and h/o AKI.  2. CAD: S/p PCI 07/2018 to pLAD but CAD does not explain his cardiomyopathy.  No chest pain.  - He is off Plavix now, on Eliquis for his atrial fibrillation.  - Continue Crestor 3.Atrial fibrillation: Paroxysmal.  He is in NSR today.   - Continue Eliquis but decrease dose to 2.5 mg bid  (age, creatinine > 1.5).  - Continue amiodarone 200 mg daily, check LFTs and TSH today (he is on Levoxyl). He needs a regular eye exam while on amiodarone.  4. CKD: Stage 3.  BMET today.  5. Hypothyroidism: Suspect this is due to amiodarone.  Levoxyl per PCP.   6. Chronic cough: ?GERD, try Protonix for 2 wks to see if this helps.   Followup in 1 month with NP/PA to reassess volume status.   Signed, [name], MD  03/28/2020  [facility] [address] [facility] [place] [address] [phone] (office) [phone] (fax)

## 2020-03-31 ENCOUNTER — Other Ambulatory Visit (HOSPITAL_COMMUNITY): Payer: Self-pay

## 2020-03-31 NOTE — Progress Notes (Signed)
Paramedicine Encounter    Patient ID: Ryan Macdonald, male    DOB: 1934/05/02, 86 y.o.   MRN: [id number]   Patient Care Team: [facility], [facility] as PCP - General (General Practice) [name], MD as PCP - Cardiology (Cardiology) Laurey Morale, MD as PCP - Advanced Heart Failure (Cardiology)  Patient Active Problem List   Diagnosis Date Noted  . Hematuria 02/15/2020  . CKD (chronic kidney disease), stage III 02/15/2020  . Constipation 02/13/2020  . Anemia 02/13/2020  . Secondary hypercoagulable state (HCC) 01/16/2020  . AKI (acute kidney injury) (HCC) 03/14/2019  . Spasm of muscle of lower back 03/14/2019  . Acute renal failure (ARF) (HCC) 03/09/2019  . Transaminitis   . Acute on chronic renal failure (HCC) 03/08/2019  . Acute exacerbation of CHF (congestive heart failure) (HCC) 11/23/2018  . Acute on chronic heart failure (HCC) 10/19/2018  . Pulmonary emphysema (HCC)   . Chronic respiratory failure with hypoxia (HCC)   . Morbid obesity (HCC) 09/09/2018  . URI (upper respiratory infection) 08/31/2018  . UTI (urinary tract infection) 08/31/2018  . Chronic systolic (congestive) heart failure (HCC) 08/28/2018  . CHF (congestive heart failure) (HCC) 08/14/2018  . Acute on chronic combined systolic and diastolic CHF (congestive heart failure) (HCC) 07/18/2018  . First degree AV block   . Shortness of breath 04/29/2018  . Acute HFrEF (heart failure with reduced ejection fraction) (HCC) 04/29/2018  . Acute respiratory failure with hypoxia (HCC) 09/27/2017  . CHF exacerbation (HCC) 09/27/2017  . Renal insufficiency   . Acute on chronic systolic CHF (congestive heart failure) (HCC) 07/08/2017  . Left knee pain 07/08/2017  . Acute bronchitis 05/10/2017  . Acute congestive heart failure (HCC) 04/14/2017  . Elevated troponin 04/14/2017  . Lactic acidosis   . Acute on chronic systolic heart failure, NYHA class 2 (HCC) 02/03/2017  . Diabetes mellitus type 2 in obese  (HCC) 02/03/2017  . CAP (community acquired pneumonia) 02/03/2017  . Lobar pneumonia (HCC)   . Diabetes mellitus with complication (HCC)   . Acute on chronic respiratory failure with hypoxia (HCC)   . Acute on chronic combined systolic and diastolic heart failure (HCC) 05/19/2016  . Depression 05/19/2016  . Gout 05/19/2016  . 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 NSVT (nonsustained ventricular tachycardia) (HCC)   . Chronic renal insufficiency, stage 3 (moderate) (HCC)   . Hyperkalemia 08/21/2015  . Pulmonary embolism (HCC)   . Acute respiratory failure (HCC)   . Osteoarthritis of left hip 10/25/2013  . Atrial fibrillation (HCC) 10/25/2013  . S/P hip replacement 10/24/2013  . Nonischemic cardiomyopathy (HCC)   . Obesity (BMI 30-39.9)   . Essential hypertension   . Hyperlipidemia     Current Outpatient Medications:  .  allopurinol (ZYLOPRIM) 100 MG tablet, Take 200 mg by mouth daily. , Disp: , Rfl:  .  amiodarone (PACERONE) 200 MG tablet, Take 200 mg by mouth daily., Disp: , Rfl:  .  apixaban (ELIQUIS) 2.5 MG TABS tablet, Take 1 tablet (2.5 mg total) by mouth 2 (two) times daily., Disp: , Rfl:  .  bisacodyl (DULCOLAX) 10 MG suppository, Place 1 suppository (10 mg total) rectally daily as needed for moderate constipation., Disp: 12 suppository, Rfl: 0 .  cyanocobalamin 1000 MCG tablet, Take 1 tablet (1,000 mcg total) by mouth daily., Disp: 90 tablet, Rfl: 0 .  diclofenac Sodium (VOLTAREN) 1 % GEL, Apply 4 g topically 4 (four) times daily., Disp: 50 g, Rfl: 0 .  finasteride (PROSCAR)  5 MG tablet, Take 1 tablet (5 mg total) by mouth daily., Disp: 30 tablet, Rfl: 0 .  glipiZIDE (GLUCOTROL) 5 MG tablet, Take 5 mg by mouth daily before breakfast., Disp: , Rfl:  .  hydrALAZINE (APRESOLINE) 25 MG tablet, Take 0.5 tablets (12.5 mg total) by mouth every 8 (eight) hours., Disp: 90 tablet, Rfl: 0 .  HYDROcodone-acetaminophen (NORCO/VICODIN) 5-325 MG tablet, Take 2  tablets by mouth every 4 (four) hours as needed., Disp: 6 tablet, Rfl: 0 .  ipratropium-albuterol (DUONEB) 0.5-2.5 (3) MG/3ML SOLN, Take 3 mLs by nebulization every 4 (four) hours as needed., Disp: 360 mL, Rfl: 0 .  isosorbide mononitrate (IMDUR) 30 MG 24 hr tablet, Take 1 tablet (30 mg total) by mouth daily., Disp: 30 tablet, Rfl: 0 .  levothyroxine (SYNTHROID) 125 MCG tablet, Take 1 tablet (125 mcg total) by mouth daily before breakfast., Disp: 90 tablet, Rfl: 1 .  magnesium oxide (MAG-OX) 400 MG tablet, Take 400 mg by mouth daily., Disp: , Rfl:  .  methocarbamol (ROBAXIN) 500 MG tablet, Take 1 tablet (500 mg total) by mouth every 6 (six) hours as needed for muscle spasms., Disp: 30 tablet, Rfl: 0 .  polyethylene glycol (MIRALAX / GLYCOLAX) 17 g packet, Take 17 g by mouth 2 (two) times daily., Disp: 14 each, Rfl: 0 .  rosuvastatin (CRESTOR) 10 MG tablet, Take 1 tablet (10 mg total) by mouth daily at 6 PM., Disp: 30 tablet, Rfl: 5 .  senna-docusate (SENOKOT-S) 8.6-50 MG tablet, Take 1 tablet by mouth 2 (two) times daily., Disp: 30 tablet, Rfl: 0 .  spironolactone (ALDACTONE) 25 MG tablet, Take 12.5 mg by mouth at bedtime., Disp: , Rfl:  .  tamsulosin (FLOMAX) 0.4 MG CAPS capsule, Take 0.4 mg by mouth at bedtime., Disp: , Rfl:  .  torsemide (DEMADEX) 20 MG tablet, Take 80 mg in the AM (4 tablets) and then 40 mg in the Evening (2 Tablets) Daily, Disp: 180 tablet, Rfl: 0 .  pantoprazole (PROTONIX) 40 MG tablet, Take 1 tablet (40 mg total) by mouth daily. (Patient not taking: Reported on 03/31/2020), Disp: 30 tablet, Rfl: 11 .  sertraline (ZOLOFT) 25 MG tablet, Take 25 mg by mouth daily. , Disp: , Rfl:  Allergies  Allergen Reactions  . Lisinopril Swelling    Facial swelling  . Xarelto [Rivaroxaban] Other (See Comments)    "Blood came out of my penis"      Social History   Socioeconomic History  . Marital status: Married    Spouse name: Not on file  . Number of children: Not on file  . Years  of education: Not on file  . Highest education level: Not on file  Occupational History  . Occupation: Retired    Comment: Former Corporate treasurer  Tobacco Use  . Smoking status: Former Smoker    Packs/day: 1.00    Years: 15.00    Pack years: 15.00    Types: Cigarettes, Cigars  . Smokeless tobacco: Former Systems developer  . Tobacco comment: quit smoking ~ 50 yr ago  Substance and Sexual Activity  . Alcohol use: No    Alcohol/week: 2.0 standard drinks    Types: 2 Cans of beer per week    Comment: rare beer.  . Drug use: No  . Sexual activity: Yes  Other Topics Concern  . Not on file  Social History Narrative   Lives in [place] with wife. Moved from [place] (2013). Does not routinely exercise.   Social Determinants of Health  Financial Resource Strain:   . Difficulty of Paying Living Expenses:   Food Insecurity:   . Worried About Programme researcher, broadcasting/film/video in the Last Year:   . Barista in the Last Year:   Transportation Needs:   . Freight forwarder (Medical):   Marland Kitchen Lack of Transportation (Non-Medical):   Physical Activity:   . Days of Exercise per Week:   . Minutes of Exercise per Session:   Stress:   . Feeling of Stress :   Social Connections:   . Frequency of Communication with Friends and Family:   . Frequency of Social Gatherings with Friends and Family:   . Attends Religious Services:   . Active Member of Clubs or Organizations:   . Attends Banker Meetings:   Marland Kitchen Marital Status:   Intimate Partner Violence:   . Fear of Current or Ex-Partner:   . Emotionally Abused:   Marland Kitchen Physically Abused:   . Sexually Abused:     Physical Exam      Future Appointments  Date Time Provider Department Center  04/29/2020 12:00 PM [facility] PA/NP [facility] None  05/19/2020 10:45 AM Felecia Shelling, DPM [facility]  06/20/2020  7:35 AM [facility] DEVICE REMOTES [facility]  09/19/2020  7:35 AM [facility] DEVICE REMOTES [facility]    BP (!) 122/58    Pulse 88   Temp 98.5 F (36.9 C)   Resp 16   Wt 201 lb (91.2 kg)   SpO2 97%   BMI 31.48 kg/m   PWeight yesterday-202 Last visit weight-209 @ clinic  Pt reports doing well. He denies any sob, no dizziness, no c/p. No missed doses of his meds.  Pt reports some family drama stuff but he states he is doing well dealing with it.  No edema noted. He wears compression stockings daily. Lungs clear.  meds verified and pill box refilled.     [name], EMT-Paramedic [phone] [facility] Paramedic  03/31/20

## 2020-04-06 IMAGING — DX DG CHEST 1V PORT
1 series · 1 of 1 positions shown · non-contrast
Comparison: 05/02/2018

CLINICAL DATA: Shortness of breath

EXAM:
PORTABLE CHEST 1 VIEW

[chest]
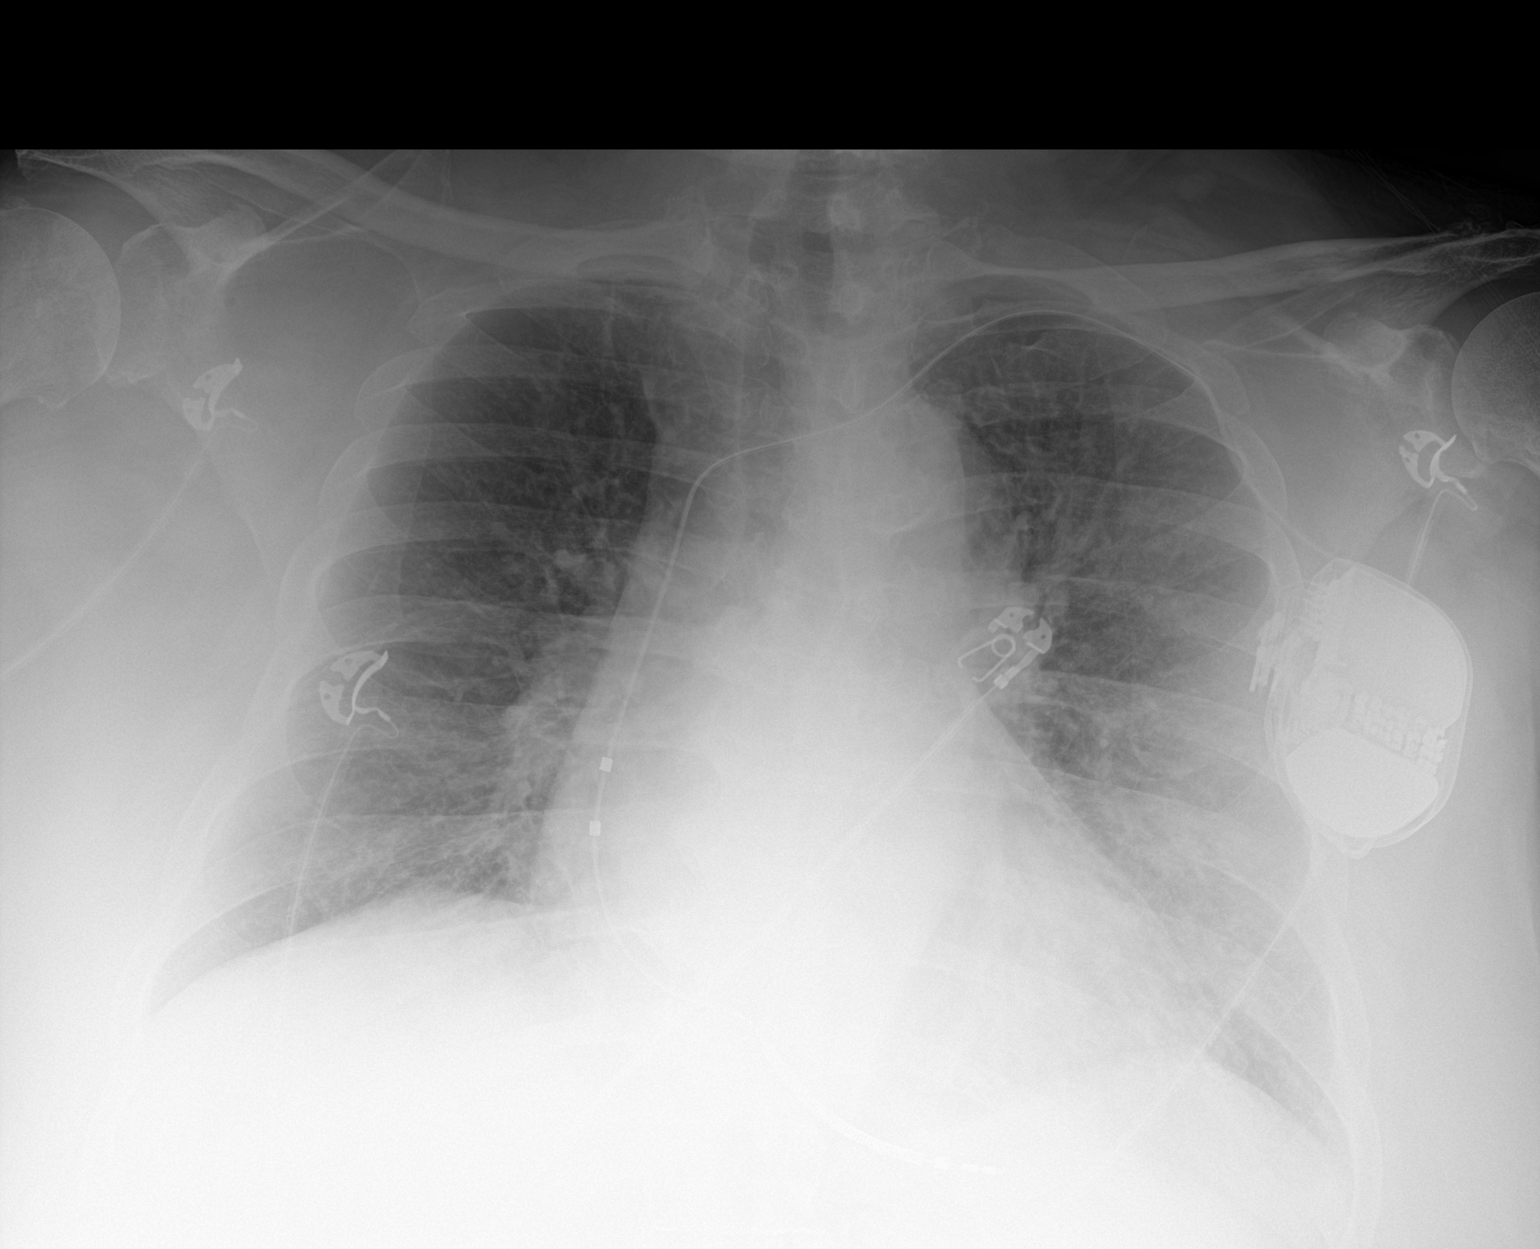

[1 of 1 positions shown; findings below may reference images not displayed]

FINDINGS: Left AICD remains in place, unchanged. Mild cardiomegaly. No
confluent airspace opacities or effusions. Mild vascular congestion.
No acute bony abnormality.
IMPRESSION: Mild cardiomegaly, vascular congestion.

## 2020-04-07 ENCOUNTER — Other Ambulatory Visit (HOSPITAL_COMMUNITY): Payer: Self-pay

## 2020-04-07 NOTE — Progress Notes (Signed)
Paramedicine Encounter    Patient ID: Ryan Macdonald, male    DOB: 1934/05/02, 86 y.o.   MRN: [id number]   Patient Care Team: [facility], [facility] as PCP - General (General Practice) [name], MD as PCP - Cardiology (Cardiology) Laurey Morale, MD as PCP - Advanced Heart Failure (Cardiology)  Patient Active Problem List   Diagnosis Date Noted  . Hematuria 02/15/2020  . CKD (chronic kidney disease), stage III 02/15/2020  . Constipation 02/13/2020  . Anemia 02/13/2020  . Secondary hypercoagulable state (HCC) 01/16/2020  . AKI (acute kidney injury) (HCC) 03/14/2019  . Spasm of muscle of lower back 03/14/2019  . Acute renal failure (ARF) (HCC) 03/09/2019  . Transaminitis   . Acute on chronic renal failure (HCC) 03/08/2019  . Acute exacerbation of CHF (congestive heart failure) (HCC) 11/23/2018  . Acute on chronic heart failure (HCC) 10/19/2018  . Pulmonary emphysema (HCC)   . Chronic respiratory failure with hypoxia (HCC)   . Morbid obesity (HCC) 09/09/2018  . URI (upper respiratory infection) 08/31/2018  . UTI (urinary tract infection) 08/31/2018  . Chronic systolic (congestive) heart failure (HCC) 08/28/2018  . CHF (congestive heart failure) (HCC) 08/14/2018  . Acute on chronic combined systolic and diastolic CHF (congestive heart failure) (HCC) 07/18/2018  . First degree AV block   . Shortness of breath 04/29/2018  . Acute HFrEF (heart failure with reduced ejection fraction) (HCC) 04/29/2018  . Acute respiratory failure with hypoxia (HCC) 09/27/2017  . CHF exacerbation (HCC) 09/27/2017  . Renal insufficiency   . Acute on chronic systolic CHF (congestive heart failure) (HCC) 07/08/2017  . Left knee pain 07/08/2017  . Acute bronchitis 05/10/2017  . Acute congestive heart failure (HCC) 04/14/2017  . Elevated troponin 04/14/2017  . Lactic acidosis   . Acute on chronic systolic heart failure, NYHA class 2 (HCC) 02/03/2017  . Diabetes mellitus type 2 in obese  (HCC) 02/03/2017  . CAP (community acquired pneumonia) 02/03/2017  . Lobar pneumonia (HCC)   . Diabetes mellitus with complication (HCC)   . Acute on chronic respiratory failure with hypoxia (HCC)   . Acute on chronic combined systolic and diastolic heart failure (HCC) 05/19/2016  . Depression 05/19/2016  . Gout 05/19/2016  . 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 NSVT (nonsustained ventricular tachycardia) (HCC)   . Chronic renal insufficiency, stage 3 (moderate) (HCC)   . Hyperkalemia 08/21/2015  . Pulmonary embolism (HCC)   . Acute respiratory failure (HCC)   . Osteoarthritis of left hip 10/25/2013  . Atrial fibrillation (HCC) 10/25/2013  . S/P hip replacement 10/24/2013  . Nonischemic cardiomyopathy (HCC)   . Obesity (BMI 30-39.9)   . Essential hypertension   . Hyperlipidemia     Current Outpatient Medications:  .  allopurinol (ZYLOPRIM) 100 MG tablet, Take 200 mg by mouth daily. , Disp: , Rfl:  .  amiodarone (PACERONE) 200 MG tablet, Take 200 mg by mouth daily., Disp: , Rfl:  .  apixaban (ELIQUIS) 2.5 MG TABS tablet, Take 1 tablet (2.5 mg total) by mouth 2 (two) times daily., Disp: , Rfl:  .  bisacodyl (DULCOLAX) 10 MG suppository, Place 1 suppository (10 mg total) rectally daily as needed for moderate constipation., Disp: 12 suppository, Rfl: 0 .  cyanocobalamin 1000 MCG tablet, Take 1 tablet (1,000 mcg total) by mouth daily., Disp: 90 tablet, Rfl: 0 .  diclofenac Sodium (VOLTAREN) 1 % GEL, Apply 4 g topically 4 (four) times daily., Disp: 50 g, Rfl: 0 .  finasteride (PROSCAR)  5 MG tablet, Take 1 tablet (5 mg total) by mouth daily., Disp: 30 tablet, Rfl: 0 .  glipiZIDE (GLUCOTROL) 5 MG tablet, Take 5 mg by mouth daily before breakfast., Disp: , Rfl:  .  hydrALAZINE (APRESOLINE) 25 MG tablet, Take 0.5 tablets (12.5 mg total) by mouth every 8 (eight) hours., Disp: 90 tablet, Rfl: 0 .  HYDROcodone-acetaminophen (NORCO/VICODIN) 5-325 MG tablet, Take 2  tablets by mouth every 4 (four) hours as needed., Disp: 6 tablet, Rfl: 0 .  ipratropium-albuterol (DUONEB) 0.5-2.5 (3) MG/3ML SOLN, Take 3 mLs by nebulization every 4 (four) hours as needed., Disp: 360 mL, Rfl: 0 .  isosorbide mononitrate (IMDUR) 30 MG 24 hr tablet, Take 1 tablet (30 mg total) by mouth daily., Disp: 30 tablet, Rfl: 0 .  levothyroxine (SYNTHROID) 125 MCG tablet, Take 1 tablet (125 mcg total) by mouth daily before breakfast., Disp: 90 tablet, Rfl: 1 .  magnesium oxide (MAG-OX) 400 MG tablet, Take 400 mg by mouth daily., Disp: , Rfl:  .  methocarbamol (ROBAXIN) 500 MG tablet, Take 1 tablet (500 mg total) by mouth every 6 (six) hours as needed for muscle spasms., Disp: 30 tablet, Rfl: 0 .  polyethylene glycol (MIRALAX / GLYCOLAX) 17 g packet, Take 17 g by mouth 2 (two) times daily., Disp: 14 each, Rfl: 0 .  rosuvastatin (CRESTOR) 10 MG tablet, Take 1 tablet (10 mg total) by mouth daily at 6 PM., Disp: 30 tablet, Rfl: 5 .  senna-docusate (SENOKOT-S) 8.6-50 MG tablet, Take 1 tablet by mouth 2 (two) times daily., Disp: 30 tablet, Rfl: 0 .  sertraline (ZOLOFT) 25 MG tablet, Take 25 mg by mouth daily. , Disp: , Rfl:  .  spironolactone (ALDACTONE) 25 MG tablet, Take 12.5 mg by mouth at bedtime., Disp: , Rfl:  .  tamsulosin (FLOMAX) 0.4 MG CAPS capsule, Take 0.4 mg by mouth at bedtime., Disp: , Rfl:  .  torsemide (DEMADEX) 20 MG tablet, Take 80 mg in the AM (4 tablets) and then 40 mg in the Evening (2 Tablets) Daily, Disp: 180 tablet, Rfl: 0 .  pantoprazole (PROTONIX) 40 MG tablet, Take 1 tablet (40 mg total) by mouth daily. (Patient not taking: Reported on 03/31/2020), Disp: 30 tablet, Rfl: 11 Allergies  Allergen Reactions  . Lisinopril Swelling    Facial swelling  . Xarelto [Rivaroxaban] Other (See Comments)    "Blood came out of my penis"      Social History   Socioeconomic History  . Marital status: Married    Spouse name: Not on file  . Number of children: Not on file  . Years  of education: Not on file  . Highest education level: Not on file  Occupational History  . Occupation: Retired    Comment: Former Public house manager  Tobacco Use  . Smoking status: Former Smoker    Packs/day: 1.00    Years: 15.00    Pack years: 15.00    Types: Cigarettes, Cigars  . Smokeless tobacco: Former Neurosurgeon  . Tobacco comment: quit smoking ~ 50 yr ago  Substance and Sexual Activity  . Alcohol use: No    Alcohol/week: 2.0 standard drinks    Types: 2 Cans of beer per week    Comment: rare beer.  . Drug use: No  . Sexual activity: Yes  Other Topics Concern  . Not on file  Social History Narrative   Lives in [place] with wife. Moved from [place] (2013). Does not routinely exercise.   Social Determinants of Health  Financial Resource Strain:   . Difficulty of Paying Living Expenses:   Food Insecurity:   . Worried About Charity fundraiser in the Last Year:   . Arboriculturist in the Last Year:   Transportation Needs:   . Film/video editor (Medical):   Marland Kitchen Lack of Transportation (Non-Medical):   Physical Activity:   . Days of Exercise per Week:   . Minutes of Exercise per Session:   Stress:   . Feeling of Stress :   Social Connections:   . Frequency of Communication with Friends and Family:   . Frequency of Social Gatherings with Friends and Family:   . Attends Religious Services:   . Active Member of Clubs or Organizations:   . Attends Archivist Meetings:   Marland Kitchen Marital Status:   Intimate Partner Violence:   . Fear of Current or Ex-Partner:   . Emotionally Abused:   Marland Kitchen Physically Abused:   . Sexually Abused:     Physical Exam      Future Appointments  Date Time Provider [place]  04/29/2020 12:00 PM [facility] PA/NP [facility] None  05/19/2020 10:45 AM Edrick Kins, DPM [facility]  06/20/2020  7:35 AM [facility] DEVICE REMOTES [facility]  09/19/2020  7:35 AM [facility] DEVICE REMOTES [facility]    BP 118/74    Pulse 82   Temp 98.6 F (37 C)   Resp 16   Wt 200 lb (90.7 kg)   SpO2 98%   BMI 31.32 kg/m   Weight yesterday-200 Last visit weight-201  Pt reports he is doing good. He denies increased sob. No dizziness. No c/p. Weight maintaining same.  meds verified and pill boxes refilled. He asked I fill 2.5 wks worth of meds. I will check in with him next week. I told him to call me in a few days if his swelling does not go down or if he notices his weight increase.  Pt has a lot of swelling to his legs, he has aide coming out today to put on his compression stockings. He had to take them off a few days ago b/c it was bothering him.    He denies eating anything too salty or too much liquids.    Marylouise Stacks, [facility] Paramedic  04/07/20

## 2020-04-14 ENCOUNTER — Telehealth (HOSPITAL_COMMUNITY): Payer: Self-pay

## 2020-04-14 NOTE — Telephone Encounter (Signed)
Spoke to pt this morning, he reports he is feeling good. He denies any issues, no bleeding issues, no c/p, no sob. No swelling. He is wearing his compression stockings daily.  His weight is 199 today.  Will see him again next Monday for f/u and pill box rec.   [name], EMT-Paramedic  04/14/20

## 2020-04-21 ENCOUNTER — Other Ambulatory Visit (HOSPITAL_COMMUNITY): Payer: Self-pay | Admitting: *Deleted

## 2020-04-21 ENCOUNTER — Other Ambulatory Visit (HOSPITAL_COMMUNITY): Payer: Self-pay

## 2020-04-21 MED ORDER — AMIODARONE HCL 200 MG PO TABS: 200.0000 mg | ORAL_TABLET | Freq: Every day | ORAL | 3 refills | Status: DC

## 2020-04-21 NOTE — Progress Notes (Signed)
Paramedicine Encounter    Patient ID: Ryan Macdonald, male    DOB: 06-14-1934, 86 y.o.   MRN: [id number]   Patient Care Team: [facility], [place] as PCP - General ([place]) Skeet Latch, MD as PCP - Cardiology (Cardiology) Larey Dresser, MD as PCP - Advanced Heart Failure (Cardiology)  Patient Active Problem List   Diagnosis Date Noted  . Hematuria 02/15/2020  . CKD (chronic kidney disease), stage III 02/15/2020  . Constipation 02/13/2020  . Anemia 02/13/2020  . Secondary hypercoagulable state ([place]) 01/16/2020  . AKI (acute kidney injury) ([place]) 03/14/2019  . Spasm of muscle of lower back 03/14/2019  . Acute renal failure (ARF) ([place]) 03/09/2019  . Transaminitis   . Acute on chronic renal failure ([place]) 03/08/2019  . Acute exacerbation of CHF (congestive heart failure) ([place]) 11/23/2018  . Acute on chronic heart failure ([place]) 10/19/2018  . Pulmonary emphysema ([place])   . Chronic respiratory failure with hypoxia ([place])   . Morbid obesity ([place]) 09/09/2018  . URI (upper respiratory infection) 08/31/2018  . UTI (urinary tract infection) 08/31/2018  . Chronic systolic (congestive) heart failure ([place]) 08/28/2018  . CHF (congestive heart failure) ([place]) 08/14/2018  . Acute on chronic combined systolic and diastolic CHF (congestive heart failure) ([place]) 07/18/2018  . First degree AV block   . Shortness of breath 04/29/2018  . Acute HFrEF (heart failure with reduced ejection fraction) ([place]) 04/29/2018  . Acute respiratory failure with hypoxia ([place]) 09/27/2017  . CHF exacerbation ([place]) 09/27/2017  . Renal insufficiency   . Acute on chronic systolic CHF (congestive heart failure) ([place]) 07/08/2017  . Left knee pain 07/08/2017  . Acute bronchitis 05/10/2017  . Acute congestive heart failure ([place]) 04/14/2017  . Elevated troponin 04/14/2017  . Lactic acidosis   . Acute on chronic systolic heart failure, NYHA class 2 ([place]) 02/03/2017  . Diabetes mellitus type 2 in obese  ([place]) 02/03/2017  . CAP (community acquired pneumonia) 02/03/2017  . Lobar pneumonia ([place])   . Diabetes mellitus with complication ([place])   . Acute on chronic respiratory failure with hypoxia ([place])   . Acute on chronic combined systolic and diastolic heart failure ([place]) 05/19/2016  . Depression 05/19/2016  . Gout 05/19/2016  . 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 NSVT (nonsustained ventricular tachycardia) ([place])   . Chronic renal insufficiency, stage 3 (moderate) (HCC)   . Hyperkalemia 08/21/2015  . Pulmonary embolism ([place])   . Acute respiratory failure ([place])   . Osteoarthritis of left hip 10/25/2013  . Atrial fibrillation ([place]) 10/25/2013  . S/P hip replacement 10/24/2013  . Nonischemic cardiomyopathy ([place])   . Obesity (BMI 30-39.9)   . Essential hypertension   . Hyperlipidemia     Current Outpatient Medications:  .  allopurinol (ZYLOPRIM) 100 MG tablet, Take 200 mg by mouth daily. , Disp: , Rfl:  .  apixaban (ELIQUIS) 2.5 MG TABS tablet, Take 1 tablet (2.5 mg total) by mouth 2 (two) times daily., Disp: , Rfl:  .  cyanocobalamin 1000 MCG tablet, Take 1 tablet (1,000 mcg total) by mouth daily., Disp: 90 tablet, Rfl: 0 .  diclofenac Sodium (VOLTAREN) 1 % GEL, Apply 4 g topically 4 (four) times daily., Disp: 50 g, Rfl: 0 .  finasteride (PROSCAR) 5 MG tablet, Take 1 tablet (5 mg total) by mouth daily., Disp: 30 tablet, Rfl: 0 .  glipiZIDE (GLUCOTROL) 5 MG tablet, Take 5 mg by mouth daily before breakfast., Disp: , Rfl:  .  hydrALAZINE (APRESOLINE) 25 MG  tablet, Take 0.5 tablets (12.5 mg total) by mouth every 8 (eight) hours., Disp: 90 tablet, Rfl: 0 .  HYDROcodone-acetaminophen (NORCO/VICODIN) 5-325 MG tablet, Take 2 tablets by mouth every 4 (four) hours as needed., Disp: 6 tablet, Rfl: 0 .  ipratropium-albuterol (DUONEB) 0.5-2.5 (3) MG/3ML SOLN, Take 3 mLs by nebulization every 4 (four) hours as needed., Disp: 360 mL, Rfl: 0 .  isosorbide mononitrate  (IMDUR) 30 MG 24 hr tablet, Take 1 tablet (30 mg total) by mouth daily., Disp: 30 tablet, Rfl: 0 .  levothyroxine (SYNTHROID) 125 MCG tablet, Take 1 tablet (125 mcg total) by mouth daily before breakfast., Disp: 90 tablet, Rfl: 1 .  magnesium oxide (MAG-OX) 400 MG tablet, Take 400 mg by mouth daily. , Disp: , Rfl:  .  methocarbamol (ROBAXIN) 500 MG tablet, Take 1 tablet (500 mg total) by mouth every 6 (six) hours as needed for muscle spasms., Disp: 30 tablet, Rfl: 0 .  polyethylene glycol (MIRALAX / GLYCOLAX) 17 g packet, Take 17 g by mouth 2 (two) times daily., Disp: 14 each, Rfl: 0 .  rosuvastatin (CRESTOR) 10 MG tablet, Take 1 tablet (10 mg total) by mouth daily at 6 PM., Disp: 30 tablet, Rfl: 5 .  senna-docusate (SENOKOT-S) 8.6-50 MG tablet, Take 1 tablet by mouth 2 (two) times daily., Disp: 30 tablet, Rfl: 0 .  sertraline (ZOLOFT) 25 MG tablet, Take 25 mg by mouth daily. , Disp: , Rfl:  .  spironolactone (ALDACTONE) 25 MG tablet, Take 12.5 mg by mouth at bedtime., Disp: , Rfl:  .  tamsulosin (FLOMAX) 0.4 MG CAPS capsule, Take 0.4 mg by mouth at bedtime., Disp: , Rfl:  .  torsemide (DEMADEX) 20 MG tablet, Take 80 mg in the AM (4 tablets) and then 40 mg in the Evening (2 Tablets) Daily, Disp: 180 tablet, Rfl: 0 .  amiodarone (PACERONE) 200 MG tablet, Take 1 tablet (200 mg total) by mouth daily., Disp: 90 tablet, Rfl: 3 .  bisacodyl (DULCOLAX) 10 MG suppository, Place 1 suppository (10 mg total) rectally daily as needed for moderate constipation. (Patient not taking: Reported on 04/21/2020), Disp: 12 suppository, Rfl: 0 .  pantoprazole (PROTONIX) 40 MG tablet, Take 1 tablet (40 mg total) by mouth daily. (Patient not taking: Reported on 03/31/2020), Disp: 30 tablet, Rfl: 11 Allergies  Allergen Reactions  . Lisinopril Swelling    Facial swelling  . Xarelto [Rivaroxaban] Other (See Comments)    "Blood came out of my penis"      Social History   Socioeconomic History  . Marital status: Married     Spouse name: Not on file  . Number of children: Not on file  . Years of education: Not on file  . Highest education level: Not on file  Occupational History  . Occupation: Retired    Comment: Former Public house manager  Tobacco Use  . Smoking status: Former Smoker    Packs/day: 1.00    Years: 15.00    Pack years: 15.00    Types: Cigarettes, Cigars  . Smokeless tobacco: Former Neurosurgeon  . Tobacco comment: quit smoking ~ 50 yr ago  Substance and Sexual Activity  . Alcohol use: No    Alcohol/week: 2.0 standard drinks    Types: 2 Cans of beer per week    Comment: rare beer.  . Drug use: No  . Sexual activity: Yes  Other Topics Concern  . Not on file  Social History Narrative   Lives in [place] with wife. Moved from [place] (2013).  Does not routinely exercise.   Social Determinants of Health   Financial Resource Strain:   . Difficulty of Paying Living Expenses:   Food Insecurity:   . Worried About Programme researcher, broadcasting/film/video in the Last Year:   . Barista in the Last Year:   Transportation Needs:   . Freight forwarder (Medical):   Marland Kitchen Lack of Transportation (Non-Medical):   Physical Activity:   . Days of Exercise per Week:   . Minutes of Exercise per Session:   Stress:   . Feeling of Stress :   Social Connections:   . Frequency of Communication with Friends and Family:   . Frequency of Social Gatherings with Friends and Family:   . Attends Religious Services:   . Active Member of Clubs or Organizations:   . Attends Banker Meetings:   Marland Kitchen Marital Status:   Intimate Partner Violence:   . Fear of Current or Ex-Partner:   . Emotionally Abused:   Marland Kitchen Physically Abused:   . Sexually Abused:     Physical Exam      Future Appointments  Date Time Provider Department Center  04/29/2020 12:00 PM [facility] PA/NP [facility] None  05/19/2020 10:45 AM Felecia Shelling, DPM [facility]  06/20/2020  7:35 AM [facility] DEVICE REMOTES [facility]  09/19/2020  7:35  AM [facility] DEVICE REMOTES [facility]    BP 136/70   Pulse 88   Temp 98.2 F (36.8 C)   Resp 18   Wt 202 lb (91.6 kg)   SpO2 96%   BMI 31.64 kg/m   Weight yesterday-204 Last visit weight-200  Pt reports he is doing good.  He has only 3 days left of the am[facility] so the doc has to refill it.  Not sure when it will be refilled and sent out--hopefully he wont have to go too long without it.  meds verified and 3 pill boxes refilled.  He does go to clinic next week and I reminded him of that appointment.  Wearing compression stockings mostly daily.  Pt denies any complaints what so ever.  No dizziness, no sob, slight edema noted to legs but not bad.   Lungs clear.  Will see him next week at clinic.   --pt called me later on stating that the [place] needs a doc refill to be sent in on the amio. I called clinic and jasmine will send it in.   [name], EMT-Paramedic [phone] [facility] Paramedic  04/21/20

## 2020-04-22 ENCOUNTER — Telehealth (HOSPITAL_COMMUNITY): Payer: Self-pay

## 2020-04-22 NOTE — Telephone Encounter (Signed)
HH orders signed and faxed to [facility] 

## 2020-04-28 NOTE — Progress Notes (Signed)
Date:  04/29/2020   ID:  Ryan Macdonald, DOB 21-Feb-1934, MRN [id number]   Provider location: [facility] Type of Visit: Established patient   PCP:  [facility]; Dr. Renelda Mom  Cardiologist:  Dr. Aundra Dubin   History of Present Illness: Ryan Macdonald is a 86 y.o. male who has a history ofsystolic HF due toNICM, Biotronic ICD, A Fibon Eliquis, DMII, HTN,CAD s/p pLAD stent in 07/2018 with residual non-obstructive disease,chronic hypoxic respiratory failure on home O2 PRN,and depression.  Admitted 12/26 - 29/92/42 with A/C systolic CHF. Diuresed 21 lbs and meds adjusted as tolerated. Echo showed EF 20-25%. EP was consulted for RV pacing and ICD settings were adjusted => patient was reprogrammed to VDD, now with narrow QRS with pacing.   Patient was admitted in 3/21 with CHF, AKI and E coli UTI.  He had a prolonged admission, was diuresed on milrinone gtt + Lasix gtt.  Amiodarone was restarted to keep him in NSR.   Today he returns for HF follow up.Overall feeling fine. Denies SOB/PND/Orthopnea. Uses an electric cart when he is shopping. Limited by back pain. No bleeding issues. Appetite ok. No fever or chills. Weight at home 201-206 pounds. Wearing compression stockings. Taking all medications. Out amiodarone. Waiting on refill from the [place]. Followed by HF Paramedicine.   Echo 03/27/20 EF 25% with moderate LV dilation and inferior/inferolateral AK; mildly decreased RV systolic function; moderate MR.   Today he returns for HF follow up.Overall feeling fine. Denies SOB/PND/Orthopnea. Appetite ok. No fever or chills. Weight at home  pounds. Taking all medications.  Labs (4/20): K 4.1, creatinine 2.23, LDL 58 Labs (6/20): TSH 43, K 4.6, creatinine 1.67 Labs (8/20): K 5.1, creatinine 1.66 Labs (10/20): K 4.9 => 5, creatinine 1.89 => 1.5, LDL 65, HDL 32, hgb 10.4 Labs (3/21): K 3.7, creatinine 2 Labs (03/27/20): K 4.2 Creatinine 1.72 Hgb 8.5   PMH: 1. Atrial  fibrillation: Paroxysmal. 2. CKD: Stage 3. 3. Type 2 diabetes 4. Hyperlipidemia 5. HTN 6. Nephrolithiasis 7. ?NAFLD 8. Gout  9. CAD: LHC (9/19) with 40% distal LM, 50% ostial LAD, 50% mid LAD, 75% proximal LAD treated with DES.  10. Chronic hypoxemic respiratory failure. Has home oxygen. ?COPD: Prior smoker.  11. CHF: EF low since 2014. Primarily nonischemic cardiomyopathy. Biotronik ICD. - Echo (12/19) with EF 20-25%, diffuse hypokinesis, moderate MR, mildly dilated RV with moderately decreased systolic function, PASP 39 mmHg.  - ACEI angioedema.  - Echo (4/21): EF 25% with moderate LV dilation and inferior/inferolateral AK; mildly decreased RV systolic function; moderate MR.  12. Hypothyroidism 13. Fe deficiency anemia.    Current Outpatient Medications  Medication Sig Dispense Refill   allopurinol (ZYLOPRIM) 100 MG tablet Take 200 mg by mouth daily.      amiodarone (PACERONE) 200 MG tablet Take 1 tablet (200 mg total) by mouth daily. 90 tablet 3   apixaban (ELIQUIS) 2.5 MG TABS tablet Take 1 tablet (2.5 mg total) by mouth 2 (two) times daily.     bisacodyl (DULCOLAX) 10 MG suppository Place 1 suppository (10 mg total) rectally daily as needed for moderate constipation. 12 suppository 0   cyanocobalamin 1000 MCG tablet Take 1 tablet (1,000 mcg total) by mouth daily. 90 tablet 0   diclofenac Sodium (VOLTAREN) 1 % GEL Apply 4 g topically 4 (four) times daily. 50 g 0   finasteride (PROSCAR) 5 MG tablet Take 1 tablet (5 mg total) by mouth daily. 30 tablet 0  glipiZIDE (GLUCOTROL) 5 MG tablet Take 5 mg by mouth daily before breakfast.     hydrALAZINE (APRESOLINE) 25 MG tablet Take 0.5 tablets (12.5 mg total) by mouth every 8 (eight) hours. 90 tablet 0   HYDROcodone-acetaminophen (NORCO/VICODIN) 5-325 MG tablet Take 2 tablets by mouth every 4 (four) hours as needed. 6 tablet 0   ipratropium-albuterol (DUONEB) 0.5-2.5 (3) MG/3ML SOLN Take 3 mLs by nebulization every 4 (four)  hours as needed. 360 mL 0   isosorbide mononitrate (IMDUR) 30 MG 24 hr tablet Take 1 tablet (30 mg total) by mouth daily. 30 tablet 0   levothyroxine (SYNTHROID) 125 MCG tablet Take 1 tablet (125 mcg total) by mouth daily before breakfast. 90 tablet 1   magnesium oxide (MAG-OX) 400 MG tablet Take 400 mg by mouth daily.      methocarbamol (ROBAXIN) 500 MG tablet Take 1 tablet (500 mg total) by mouth every 6 (six) hours as needed for muscle spasms. 30 tablet 0   polyethylene glycol (MIRALAX / GLYCOLAX) 17 g packet Take 17 g by mouth 2 (two) times daily. 14 each 0   rosuvastatin (CRESTOR) 10 MG tablet Take 1 tablet (10 mg total) by mouth daily at 6 PM. 30 tablet 5   senna-docusate (SENOKOT-S) 8.6-50 MG tablet Take 1 tablet by mouth 2 (two) times daily. 30 tablet 0   sertraline (ZOLOFT) 25 MG tablet Take 25 mg by mouth daily.      spironolactone (ALDACTONE) 25 MG tablet Take 12.5 mg by mouth at bedtime.     tamsulosin (FLOMAX) 0.4 MG CAPS capsule Take 0.4 mg by mouth at bedtime.     torsemide (DEMADEX) 20 MG tablet Take 80 mg in the AM (4 tablets) and then 40 mg in the Evening (2 Tablets) Daily 180 tablet 0   pantoprazole (PROTONIX) 40 MG tablet Take 1 tablet (40 mg total) by mouth daily. (Patient not taking: Reported on 03/31/2020) 30 tablet 11   No current facility-administered medications for this encounter.    Allergies:   Lisinopril and Xarelto [rivaroxaban]   Social History:  The patient  reports that he has quit smoking. His smoking use included cigarettes and cigars. He has a 15.00 pack-year smoking history. He has quit using smokeless tobacco. He reports that he does not drink alcohol or use drugs.   Family History:  The patient's family history includes Heart Problems in his maternal grandmother; Heart disease in his mother; Other in an other family member.   ROS:  Please see the history of present illness.   All other systems are personally reviewed and negative.  Wt Readings  from Last 3 Encounters:  04/29/20 98.1 kg (216 lb 4 oz)  04/21/20 91.6 kg (202 lb)  04/07/20 90.7 kg (200 lb)    Exam:  BP 128/66    Pulse 93    Wt 98.1 kg (216 lb 4 oz)    SpO2 96%    BMI 33.87 kg/m  General:  Arrived in a wheel chair. No resp difficulty HEENT: normal Neck: supple. no JVD. Carotids 2+ bilat; no bruits. No lymphadenopathy or thryomegaly appreciated. Cor: PMI nondisplaced. Regular rate & rhythm. No rubs, gallops or murmurs. Lungs: clear Abdomen: soft, nontender, nondistended. No hepatosplenomegaly. No bruits or masses. Good bowel sounds. Extremities: no cyanosis, clubbing, rash, edema Neuro: alert & orientedx3, cranial nerves grossly intact. moves all 4 extremities w/o difficulty. Affect pleasant  EKG : NSR 92 bpm PR 232 ms   Recent Labs: 02/11/2020: B Natriuretic Peptide 1,569.0  02/24/2020: Magnesium 1.7 03/27/2020: ALT 29; BUN 58; Creatinine, Ser 1.72; Hemoglobin 8.5; Platelets 262; Potassium 4.2; Sodium 137; TSH 1.665  Personally reviewed   Wt Readings from Last 3 Encounters:  04/29/20 98.1 kg (216 lb 4 oz)  04/21/20 91.6 kg (202 lb)  04/07/20 90.7 kg (200 lb)    ASSESSMENT AND PLAN:  1. Chronic systolic CHF: Primarily nonischemic cardiomyopathy. EF has been low since at least 2014.  He has single chamber Biotronik ICD. QRS not wide enough for CRT.Settings adjusted in the past to prevent RV pacing in the face of a long 1st degree AV block, he is not pacing today.  Echo today showed EF 25% with moderate LV dilation and inferior/inferolateral AK; mildly decreased RV systolic function; moderate MR.  -NYHA II-III. Volume status stable.  Continue torsemide 80 qam/40 qpm    - Continue spironolactone 12.5 mg qhs.   - Continue hydralazine 12.5 mg tid and Imdur 30 mg daily.  No BP room for titration.   -Have been holding off on beta blocker given long 1st degree AVB to limit RV pacing.  -No ACEI or ARNI with angioedema on lisinopril. - Would hold off on ARB with CKD  stage 3 and h/o AKI.  2. CAD: S/p PCI 07/2018 to pLAD but CAD does not explain his cardiomyopathy.  No chest pain.  - He is off Plavix now, on Eliquis for his atrial fibrillation.  - Continue Crestor 3.Atrial fibrillation: Paroxysmal.  - Continue Eliquis at reduced dose 2.5 mg bid (age, creatinine > 1.5).  - Continue amiodarone 200 mg daily, LFTS and TSH stable. (he is on Levoxyl). He needs a regular eye exam while on amiodarone.  [facility]  4. CKD: Stage 3. I reviewed BMET from 03/27/20, Stable.  5. Hypothyroidism: Suspect this is due to amiodarone.  Levoxyl per PCP.   6. Chronic cough: ?GERD, try Protonix for 2 wks to see if this helps.   Follow up 3 months with Dr Shirlee Latch.   Waneta Martins, NP  04/29/2020  [facility] [address] [facility] [place] [address] [phone] (office) [phone] (fax)

## 2020-04-29 ENCOUNTER — Other Ambulatory Visit (HOSPITAL_COMMUNITY): Payer: Self-pay

## 2020-04-29 ENCOUNTER — Other Ambulatory Visit: Payer: Self-pay

## 2020-04-29 ENCOUNTER — Ambulatory Visit (HOSPITAL_COMMUNITY)
Admission: RE | Admit: 2020-04-29 | Discharge: 2020-04-29 | Disposition: A | Discharge status: Home / Self Care | Payer: No Typology Code available for payment source | Source: Ambulatory Visit | Attending: Adult Health | Admitting: Adult Health

## 2020-04-29 VITALS — BP 128/66 | HR 93 | Wt 216.2 lb

## 2020-04-29 DIAGNOSIS — Z7901 Long term (current) use of anticoagulants: Secondary | ICD-10-CM | POA: Insufficient documentation

## 2020-04-29 DIAGNOSIS — M109 Gout, unspecified: Secondary | ICD-10-CM | POA: Insufficient documentation

## 2020-04-29 DIAGNOSIS — Z87442 Personal history of urinary calculi: Secondary | ICD-10-CM | POA: Insufficient documentation

## 2020-04-29 DIAGNOSIS — I480 Paroxysmal atrial fibrillation: Secondary | ICD-10-CM | POA: Insufficient documentation

## 2020-04-29 DIAGNOSIS — I5022 Chronic systolic (congestive) heart failure: Secondary | ICD-10-CM | POA: Diagnosis not present

## 2020-04-29 DIAGNOSIS — I428 Other cardiomyopathies: Secondary | ICD-10-CM | POA: Diagnosis not present

## 2020-04-29 DIAGNOSIS — Z955 Presence of coronary angioplasty implant and graft: Secondary | ICD-10-CM | POA: Insufficient documentation

## 2020-04-29 DIAGNOSIS — I130 Hypertensive heart and chronic kidney disease with heart failure and stage 1 through stage 4 chronic kidney disease, or unspecified chronic kidney disease: Secondary | ICD-10-CM | POA: Insufficient documentation

## 2020-04-29 DIAGNOSIS — Z79890 Hormone replacement therapy (postmenopausal): Secondary | ICD-10-CM | POA: Insufficient documentation

## 2020-04-29 DIAGNOSIS — E1122 Type 2 diabetes mellitus with diabetic chronic kidney disease: Secondary | ICD-10-CM | POA: Insufficient documentation

## 2020-04-29 DIAGNOSIS — N1831 Chronic kidney disease, stage 3a: Secondary | ICD-10-CM

## 2020-04-29 DIAGNOSIS — J9611 Chronic respiratory failure with hypoxia: Secondary | ICD-10-CM | POA: Insufficient documentation

## 2020-04-29 DIAGNOSIS — R05 Cough: Secondary | ICD-10-CM | POA: Insufficient documentation

## 2020-04-29 DIAGNOSIS — I2510 Atherosclerotic heart disease of native coronary artery without angina pectoris: Secondary | ICD-10-CM | POA: Insufficient documentation

## 2020-04-29 DIAGNOSIS — Z888 Allergy status to other drugs, medicaments and biological substances status: Secondary | ICD-10-CM | POA: Insufficient documentation

## 2020-04-29 DIAGNOSIS — Z9981 Dependence on supplemental oxygen: Secondary | ICD-10-CM | POA: Insufficient documentation

## 2020-04-29 DIAGNOSIS — Z95810 Presence of automatic (implantable) cardiac defibrillator: Secondary | ICD-10-CM | POA: Insufficient documentation

## 2020-04-29 DIAGNOSIS — Z7984 Long term (current) use of oral hypoglycemic drugs: Secondary | ICD-10-CM | POA: Insufficient documentation

## 2020-04-29 DIAGNOSIS — Z87891 Personal history of nicotine dependence: Secondary | ICD-10-CM | POA: Insufficient documentation

## 2020-04-29 DIAGNOSIS — E039 Hypothyroidism, unspecified: Secondary | ICD-10-CM | POA: Insufficient documentation

## 2020-04-29 DIAGNOSIS — Z8249 Family history of ischemic heart disease and other diseases of the circulatory system: Secondary | ICD-10-CM | POA: Insufficient documentation

## 2020-04-29 DIAGNOSIS — Z79899 Other long term (current) drug therapy: Secondary | ICD-10-CM | POA: Insufficient documentation

## 2020-04-29 DIAGNOSIS — N183 Chronic kidney disease, stage 3 unspecified: Secondary | ICD-10-CM | POA: Insufficient documentation

## 2020-04-29 MED ORDER — AMIODARONE HCL 200 MG PO TABS: 200.0000 mg | ORAL_TABLET | Freq: Every day | ORAL | 3 refills | Status: DC

## 2020-04-29 MED FILL — AMIODARONE HCL 200 MG TAB: 200 | 34 days supply | Qty: 34 | Fill #0

## 2020-04-29 NOTE — Addendum Note (Signed)
Encounter addended by: Noralee Space, RN on: 04/29/2020 1:33 PM  Actions taken: Order list changed

## 2020-04-29 NOTE — Progress Notes (Signed)
Paramedicine Encounter   Patient ID: Ryan Macdonald , male,   DOB: 1934-02-23,85 y.o.,  MRN: [id number]   Met patient in clinic today with provider.  Weight today-216 @ clinic Weight @ home-204  B/p-128/66 sp02-96 p-93  His weight is up 7lbs from last clinic visit a month ago.  He has been doing better with wearing his compression stockings.  His weights at home ranging between 200-206 depending on what he eats.  Takes all his meds.  No med changes today.  Fluid level looks good today.    Marylouise Stacks, [place] 04/29/2020

## 2020-04-29 NOTE — Progress Notes (Signed)
Went by [facility] pharmacy to p/u his amio since [place] pharmacy didn't even have the rx and therefore wasn't even being sent out yet. Ethel Rana Got that squared away, he has a month supply from outpt pharm to bridge [facility] until [facility]. I went by to place it in pill box for him.   [name], EMT-Paramedic  04/29/20

## 2020-04-29 NOTE — Progress Notes (Signed)
Pt reported he has been out of Amio since [place] and is awaiting a shipment from the [place] but has not gotten it yet. [facility] at [phone] they state they have not received a new rx with refills and pt needs refills, advised per our chart med was sent in on 5/24 and pharmacy confirmed receipt. He states they do not usually get Electronic Prescriptions and states it needs to be faxed to them at [phone]. RX for Amio sent to [facility] under HF [facility]D for 1 month, rx with refills faxed to [place], pt is aware, agreeable and thankful for assistance.

## 2020-04-29 NOTE — Patient Instructions (Signed)
It was great to see you today! No medication changes are needed at this time.   Your physician recommends that you schedule a follow-up appointment in: 3 months with Dr McLean  Do the following things EVERYDAY: 1) Weigh yourself in the morning before breakfast. Write it down and keep it in a log. 2) Take your medicines as prescribed 3) Eat low salt foods--Limit salt (sodium) to 2000 mg per day.  4) Stay as active as you can everyday 5) Limit all fluids for the day to less than 2 liters  At the [facility], you and your health needs are our priority. As part of our continuing mission to provide you with exceptional heart care, we have created designated Provider Care Teams. These Care Teams include your primary Cardiologist (physician) and Advanced Practice Providers (APPs- Physician Assistants and Nurse Practitioners) who all work together to provide you with the care you need, when you need it.   You may see any of the following providers on your designated Care Team at your next follow up: . Dr Daniel Bensimhon . Dr Dalton McLean . Amy Clegg, NP . Brittainy Simmons, PA . Lauren Kemp, PharmD   Please be sure to bring in all your medications bottles to every appointment.     

## 2020-05-01 ENCOUNTER — Telehealth (HOSPITAL_COMMUNITY): Payer: Self-pay

## 2020-05-01 NOTE — Telephone Encounter (Signed)
Received a request for patients last ov notes from the [place]. Notes were successfully faxed to [phone]

## 2020-05-03 IMAGING — DX DG CHEST 1V PORT
1 series · 1 of 1 positions shown · non-contrast
Comparison: 07/31/2018

CLINICAL DATA: Respiratory distress

EXAM:
PORTABLE CHEST 1 VIEW

[chest ap]
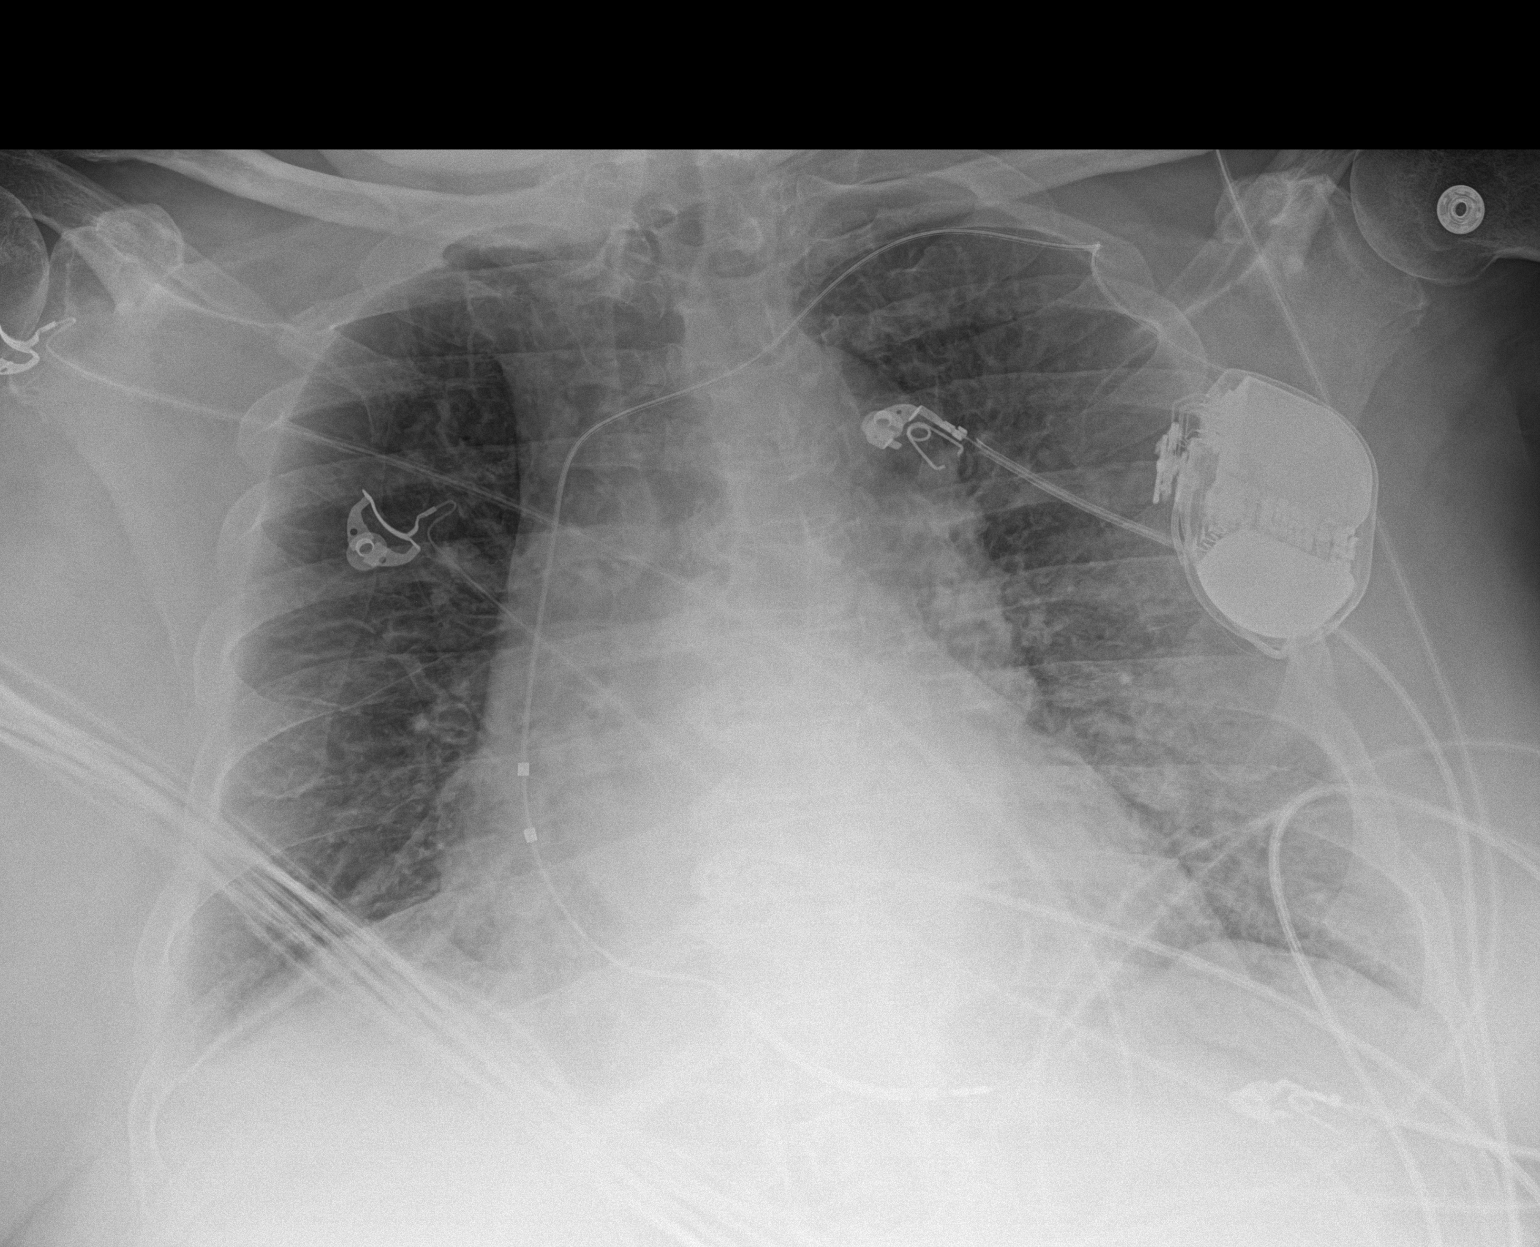

[1 of 1 positions shown; findings below may reference images not displayed]

FINDINGS: Single chamber ICD lead into the right ventricle. There is
cardiomegaly and aortic tortuosity. Cardiomediastinal contours are
distorted by rightward rotation. Vascular congestion without Kerley
lines or air bronchogram. No pneumothorax.
IMPRESSION: Cardiomegaly and vascular congestion.

## 2020-05-12 ENCOUNTER — Other Ambulatory Visit (HOSPITAL_COMMUNITY): Payer: Self-pay

## 2020-05-12 ENCOUNTER — Other Ambulatory Visit (HOSPITAL_COMMUNITY): Payer: Self-pay | Admitting: *Deleted

## 2020-05-12 MED ORDER — AMIODARONE HCL 200 MG PO TABS: 200.0000 mg | ORAL_TABLET | Freq: Every day | ORAL | 3 refills | Status: DC

## 2020-05-12 NOTE — Progress Notes (Signed)
Paramedicine Encounter    Patient ID: Ryan Macdonald, male    DOB: [birth year], 86 y.o.   MRN: [id number]   Patient Care Team: [facility], [place] as PCP - General (Advance) Skeet Latch, MD as PCP - Cardiology (Cardiology) Larey Dresser, MD as PCP - [facility] (Cardiology)  Patient Active Problem List   Diagnosis Date Noted  . Hematuria 02/15/2020  . CKD (chronic kidney disease), stage III 02/15/2020  . Constipation 02/13/2020  . Anemia 02/13/2020  . Secondary hypercoagulable state ([place]) 01/16/2020  . AKI (acute kidney injury) ([place]) 03/14/2019  . Spasm of muscle of lower back 03/14/2019  . Acute renal failure (ARF) ([place]) 03/09/2019  . Transaminitis   . Acute on chronic renal failure ([place]) 03/08/2019  . Acute exacerbation of CHF (congestive heart failure) ([place]) 11/23/2018  . Acute on chronic heart failure ([place]) 10/19/2018  . Pulmonary emphysema ([place])   . Chronic respiratory failure with hypoxia ([place])   . Morbid obesity ([place]) 09/09/2018  . URI (upper respiratory infection) 08/31/2018  . UTI (urinary tract infection) 08/31/2018  . Chronic systolic (congestive) heart failure ([place]) 08/28/2018  . CHF (congestive heart failure) ([place]) 08/14/2018  . Acute on chronic combined systolic and diastolic CHF (congestive heart failure) ([place]) 07/18/2018  . First degree AV block   . Shortness of breath 04/29/2018  . Acute HFrEF (heart failure with reduced ejection fraction) ([place]) 04/29/2018  . Acute respiratory failure with hypoxia ([place]) 09/27/2017  . CHF exacerbation ([place]) 09/27/2017  . Renal insufficiency   . Acute on chronic systolic CHF (congestive heart failure) ([place]) 07/08/2017  . Left knee pain 07/08/2017  . Acute bronchitis 05/10/2017  . Acute congestive heart failure ([place]) 04/14/2017  . Elevated troponin 04/14/2017  . Lactic acidosis   . Acute on chronic systolic heart failure, NYHA class 2 ([place]) 02/03/2017  . Diabetes mellitus type 2 in obese  ([place]) 02/03/2017  . CAP (community acquired pneumonia) 02/03/2017  . Lobar pneumonia ([place])   . Diabetes mellitus with complication ([place])   . Acute on chronic respiratory failure with hypoxia ([place])   . Acute on chronic combined systolic and diastolic heart failure ([place]) 05/19/2016  . Depression 05/19/2016  . Gout 05/19/2016  . 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 NSVT (nonsustained ventricular tachycardia) ([place])   . Chronic renal insufficiency, stage 3 (moderate) (HCC)   . Hyperkalemia 08/21/2015  . Pulmonary embolism ([place])   . Acute respiratory failure ([place])   . Osteoarthritis of left hip 10/25/2013  . Atrial fibrillation ([place]) 10/25/2013  . S/P hip replacement 10/24/2013  . Nonischemic cardiomyopathy ([place])   . Obesity (BMI 30-39.9)   . Essential hypertension   . Hyperlipidemia     Current Outpatient Medications:  .  allopurinol (ZYLOPRIM) 100 MG tablet, Take 200 mg by mouth daily. , Disp: , Rfl:  .  amiodarone (PACERONE) 200 MG tablet, Take 1 tablet (200 mg total) by mouth daily., Disp: 90 tablet, Rfl: 3 .  apixaban (ELIQUIS) 2.5 MG TABS tablet, Take 1 tablet (2.5 mg total) by mouth 2 (two) times daily., Disp: , Rfl:  .  cyanocobalamin 1000 MCG tablet, Take 1 tablet (1,000 mcg total) by mouth daily., Disp: 90 tablet, Rfl: 0 .  diclofenac Sodium (VOLTAREN) 1 % GEL, Apply 4 g topically 4 (four) times daily., Disp: 50 g, Rfl: 0 .  finasteride (PROSCAR) 5 MG tablet, Take 1 tablet (5 mg total) by mouth daily., Disp: 30 tablet, Rfl: 0 .  glipiZIDE (GLUCOTROL)  5 MG tablet, Take 5 mg by mouth daily before breakfast., Disp: , Rfl:  .  hydrALAZINE (APRESOLINE) 25 MG tablet, Take 0.5 tablets (12.5 mg total) by mouth every 8 (eight) hours., Disp: 90 tablet, Rfl: 0 .  HYDROcodone-acetaminophen (NORCO/VICODIN) 5-325 MG tablet, Take 2 tablets by mouth every 4 (four) hours as needed., Disp: 6 tablet, Rfl: 0 .  ipratropium-albuterol (DUONEB) 0.5-2.5 (3) MG/3ML SOLN,  Take 3 mLs by nebulization every 4 (four) hours as needed., Disp: 360 mL, Rfl: 0 .  isosorbide mononitrate (IMDUR) 30 MG 24 hr tablet, Take 1 tablet (30 mg total) by mouth daily., Disp: 30 tablet, Rfl: 0 .  levothyroxine (SYNTHROID) 125 MCG tablet, Take 1 tablet (125 mcg total) by mouth daily before breakfast., Disp: 90 tablet, Rfl: 1 .  magnesium oxide (MAG-OX) 400 MG tablet, Take 400 mg by mouth daily. , Disp: , Rfl:  .  methocarbamol (ROBAXIN) 500 MG tablet, Take 1 tablet (500 mg total) by mouth every 6 (six) hours as needed for muscle spasms., Disp: 30 tablet, Rfl: 0 .  polyethylene glycol (MIRALAX / GLYCOLAX) 17 g packet, Take 17 g by mouth 2 (two) times daily., Disp: 14 each, Rfl: 0 .  rosuvastatin (CRESTOR) 10 MG tablet, Take 1 tablet (10 mg total) by mouth daily at 6 PM., Disp: 30 tablet, Rfl: 5 .  senna-docusate (SENOKOT-S) 8.6-50 MG tablet, Take 1 tablet by mouth 2 (two) times daily., Disp: 30 tablet, Rfl: 0 .  sertraline (ZOLOFT) 25 MG tablet, Take 25 mg by mouth daily. , Disp: , Rfl:  .  spironolactone (ALDACTONE) 25 MG tablet, Take 12.5 mg by mouth at bedtime., Disp: , Rfl:  .  tamsulosin (FLOMAX) 0.4 MG CAPS capsule, Take 0.4 mg by mouth at bedtime., Disp: , Rfl:  .  torsemide (DEMADEX) 20 MG tablet, Take 80 mg in the AM (4 tablets) and then 40 mg in the Evening (2 Tablets) Daily, Disp: 180 tablet, Rfl: 0 .  bisacodyl (DULCOLAX) 10 MG suppository, Place 1 suppository (10 mg total) rectally daily as needed for moderate constipation. (Patient not taking: Reported on 05/12/2020), Disp: 12 suppository, Rfl: 0 .  pantoprazole (PROTONIX) 40 MG tablet, Take 1 tablet (40 mg total) by mouth daily. (Patient not taking: Reported on 03/31/2020), Disp: 30 tablet, Rfl: 11 Allergies  Allergen Reactions  . Lisinopril Swelling    Facial swelling  . Xarelto [Rivaroxaban] Other (See Comments)    "Blood came out of my penis"      Social History   Socioeconomic History  . Marital status: Married     Spouse name: Not on file  . Number of children: Not on file  . Years of education: Not on file  . Highest education level: Not on file  Occupational History  . Occupation: Retired    Comment: Former Public house manager  Tobacco Use  . Smoking status: Former Smoker    Packs/day: 1.00    Years: 15.00    Pack years: 15.00    Types: Cigarettes, Cigars  . Smokeless tobacco: Former Neurosurgeon  . Tobacco comment: quit smoking ~ 50 yr ago  Vaping Use  . Vaping Use: Never used  Substance and Sexual Activity  . Alcohol use: No    Alcohol/week: 2.0 standard drinks    Types: 2 Cans of beer per week    Comment: rare beer.  . Drug use: No  . Sexual activity: Yes  Other Topics Concern  . Not on file  Social History Narrative   Lives  in [place] with wife. Moved from [place] (2013). Does not routinely exercise.   Social Determinants of Health   Financial Resource Strain:   . Difficulty of Paying Living Expenses:   Food Insecurity:   . Worried About Programme researcher, broadcasting/film/video in the Last Year:   . Barista in the Last Year:   Transportation Needs:   . Freight forwarder (Medical):   Marland Kitchen Lack of Transportation (Non-Medical):   Physical Activity:   . Days of Exercise per Week:   . Minutes of Exercise per Session:   Stress:   . Feeling of Stress :   Social Connections:   . Frequency of Communication with Friends and Family:   . Frequency of Social Gatherings with Friends and Family:   . Attends Religious Services:   . Active Member of Clubs or Organizations:   . Attends Banker Meetings:   Marland Kitchen Marital Status:   Intimate Partner Violence:   . Fear of Current or Ex-Partner:   . Emotionally Abused:   Marland Kitchen Physically Abused:   . Sexually Abused:     Physical Exam      Future Appointments  Date Time Provider Department Center  05/19/2020 10:45 AM Felecia Shelling, DPM [facility]  06/20/2020  7:35 AM [facility] DEVICE REMOTES [facility]  08/08/2020 10:20 AM Laurey Morale, MD [facility] None  09/19/2020  7:35 AM [facility] DEVICE REMOTES [facility]    BP 118/64   Pulse 78   Temp 97.9 F (36.6 C)   Resp 18   Wt 196 lb (88.9 kg)   SpO2 97%   BMI 30.70 kg/m   Weight yesterday-196 Last visit weight-204  Pt reports he has been feeling good. He denies any complaints. No sob, no dizziness, no edema, no c/p.  He has not got his amio from [place] yet, called pharmacy at [facility] and someone is suppose to call me back. He also does not have the finasteride, when they call me back I will ask about that as well.  Pt denies sob, no c/p, no dizziness.  Weight is coming down. Legs look great. Lungs clear.    This is suppose to be their new fax #--will relay to clinic. They have not gotten any fax whatsoever and they said the fax we have on file is wrong. They just changed fax numbers last week.   Fax #-[phone] Relayed this info to nurse at clinic and she will fax it again.   meds verified and 3 pill boxes refilled.   [name], EMT-Paramedic [phone] [facility] Paramedic  05/12/20

## 2020-05-12 NOTE — Telephone Encounter (Signed)
[facility], they state they have a new fax number and need it faxed to [phone], rx faxed

## 2020-05-17 IMAGING — DX DG CHEST 1V PORT
1 series · 1 of 1 positions shown · non-contrast
Comparison: Chest radiograph 08/14/2018

CLINICAL DATA: Patient which generalized weakness.

EXAM:
PORTABLE CHEST 1 VIEW

[chest ap]
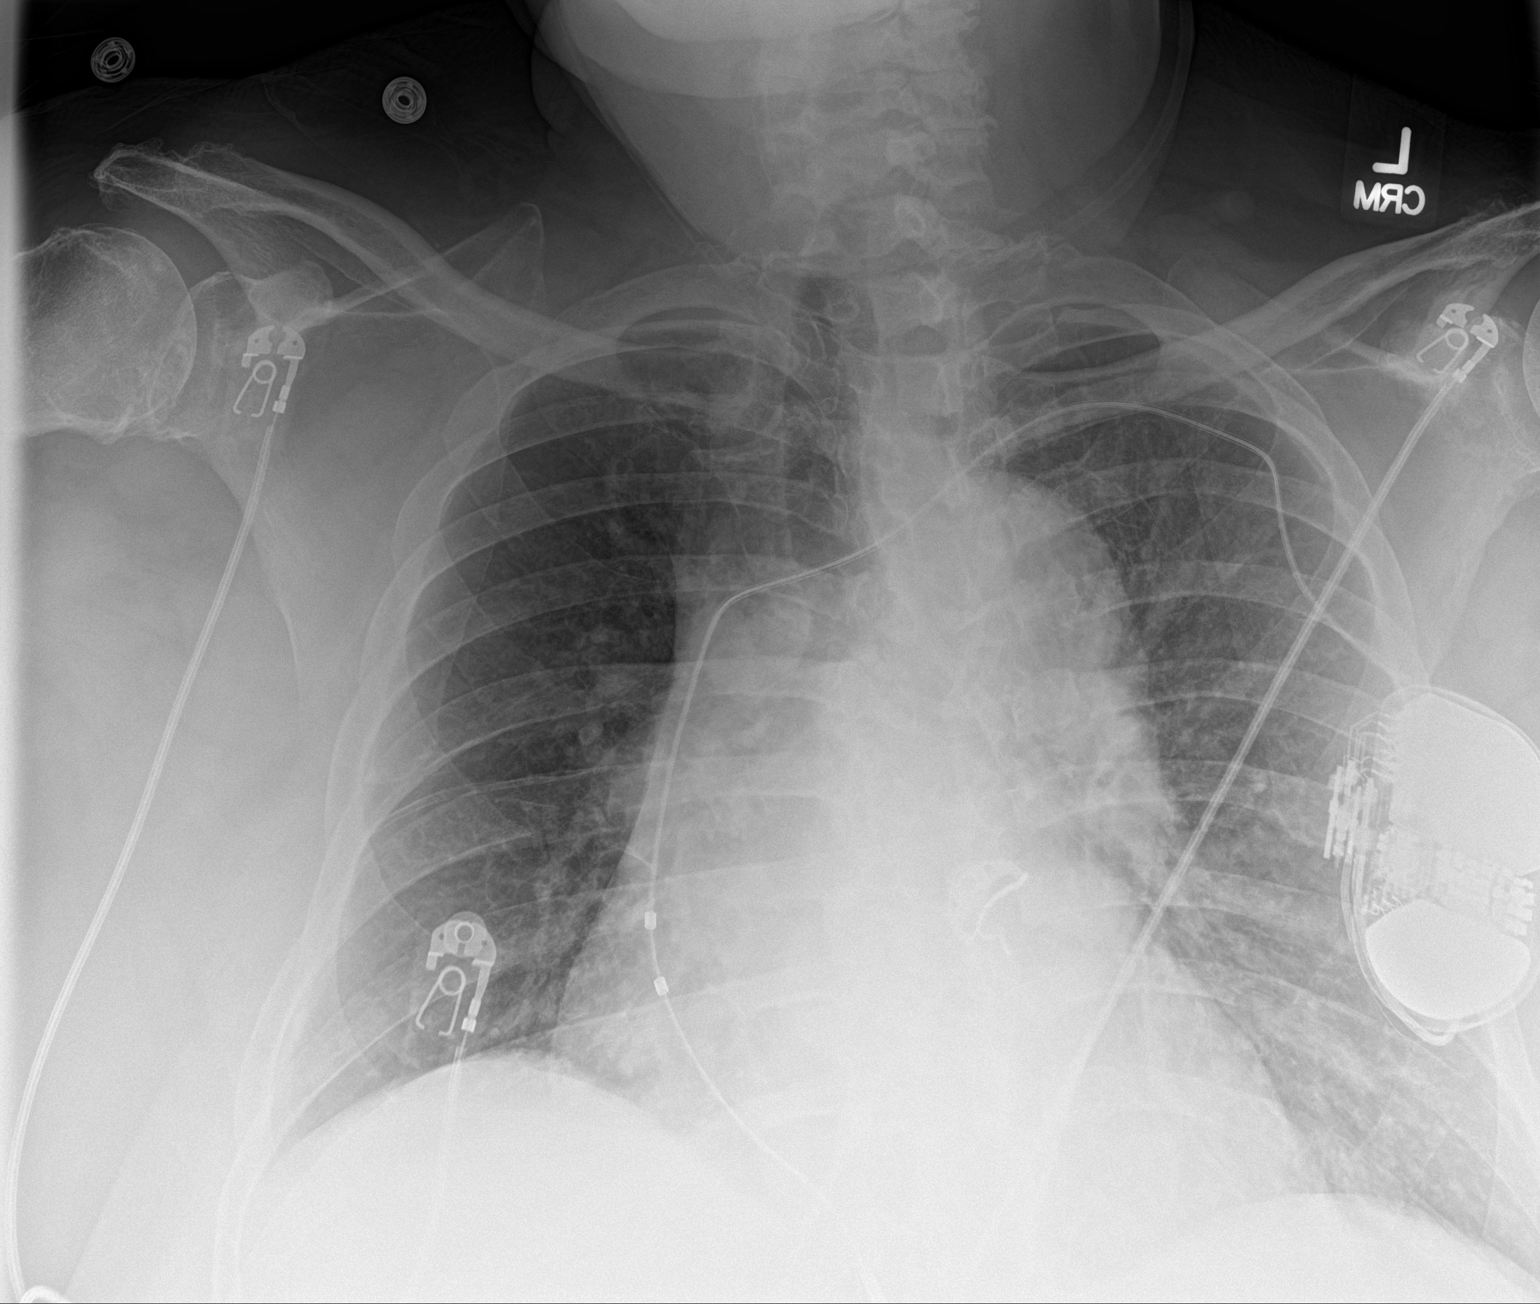

[1 of 1 positions shown; findings below may reference images not displayed]

FINDINGS: Single lead AICD device overlies the left hemithorax. Stable
cardiomegaly. Bilateral interstitial pulmonary opacities,
left-greater-than-right. No pleural effusion. Thoracic spine
degenerative changes.
IMPRESSION: Cardiomegaly with mild interstitial edema.

## 2020-05-19 ENCOUNTER — Encounter: Payer: Self-pay | Admitting: Podiatry

## 2020-05-19 ENCOUNTER — Ambulatory Visit (INDEPENDENT_AMBULATORY_CARE_PROVIDER_SITE_OTHER): Payer: Non-veteran care | Admitting: Podiatry

## 2020-05-19 ENCOUNTER — Other Ambulatory Visit: Payer: Self-pay

## 2020-05-19 DIAGNOSIS — M79676 Pain in unspecified toe(s): Secondary | ICD-10-CM

## 2020-05-19 DIAGNOSIS — E0843 Diabetes mellitus due to underlying condition with diabetic autonomic (poly)neuropathy: Secondary | ICD-10-CM | POA: Diagnosis not present

## 2020-05-19 DIAGNOSIS — L989 Disorder of the skin and subcutaneous tissue, unspecified: Secondary | ICD-10-CM

## 2020-05-19 DIAGNOSIS — B351 Tinea unguium: Secondary | ICD-10-CM | POA: Diagnosis not present

## 2020-05-19 NOTE — Progress Notes (Signed)
    Subjective: Patient is a 86 y.o. male presenting to the office today with a chief complaint of painful callus lesion(s) noted to the bilateral feet that have been present for the past few months. Walking and bearing weight increases the pain. He has not had any treatment.  Patient also complains of elongated, thickened nails that cause pain while ambulating in shoes. He is unable to trim his own nails. Patient presents today for further treatment and evaluation.  Past Medical History:  Diagnosis Date  . AKI (acute kidney injury) (HCC) 02/2019  . Atrial fibrillation (HCC)   . Cardiomyopathy (HCC)   . Cat scratch fever    removed mass on right side neck  . Cataracts, bilateral   . CHF (congestive heart failure) (HCC)   . Chronic kidney disease   . Corns and callosities   . Diabetes mellitus without complication (HCC)    TYPE 2  . Dyspnea   . Erythema intertrigo   . Flat foot   . Gout   . High cholesterol   . Hx of urethral stricture   . Hypertension   . Kidney stones   . Morbid obesity (HCC)   . Nonischemic cardiomyopathy (HCC)    a. ? 2009 Cath in [place] - nl cors per pt;  b. 09/2013 Echo: EF 25-30%, sev diff HK.  . Obesity   . Osteoarthritis   . PAF (paroxysmal atrial fibrillation) (HCC)    a. post-op hip in 2014 - prev on xarelto.  . Renal insufficiency     Objective:  Physical Exam General: Alert and oriented x3 in no acute distress  Dermatology: Hyperkeratotic lesion(s) present on the bilateral feet. Pain on palpation with a central nucleated core noted. Skin is warm, dry and supple bilateral lower extremities. Negative for open lesions or macerations. Nails are tender, long, thickened and dystrophic with subungual debris, consistent with onychomycosis, 1-5 bilateral. No signs of infection noted.  Vascular: Palpable pedal pulses bilaterally. No edema or erythema noted. Capillary refill within normal limits.  Neurological: Epicritic and protective threshold diminished  bilaterally.   Musculoskeletal Exam: Pain on palpation at the keratotic lesion(s) noted. Range of motion within normal limits bilateral. Muscle strength 5/5 in all groups bilateral.  Assessment: 1. Onychodystrophic nails 1-5 bilateral with hyperkeratosis of nails.  2. Onychomycosis of nail due to dermatophyte bilateral 3. Pre-ulcerative callus lesions noted to the bilateral feet x 5   Plan of Care:  1. Patient evaluated. 2. Excisional debridement of keratoic lesion(s) using a chisel blade was performed without incident.  3. Dressed with light dressing. 4. Mechanical debridement of nails 1-5 bilaterally performed using a nail nipper. Filed with dremel without incident.  5. Patient is to return to the clinic in 3 months.   Felecia Shelling, DPM [facility]  Dr. Felecia Shelling, DPM    [address]                                        [place], [place] [address]                Office [phone]  Fax [phone]

## 2020-05-28 ENCOUNTER — Other Ambulatory Visit (HOSPITAL_COMMUNITY): Payer: Self-pay

## 2020-05-28 NOTE — Progress Notes (Signed)
Paramedicine Encounter    Patient ID: Ryan Macdonald, male    DOB: 09-07-1934, 86 y.o.   MRN: [id number]   Patient Care Team: [facility], [facility] as [facility] - General (General Practice) [name], MD as PCP - Cardiology (Cardiology) Laurey Morale, MD as PCP - Advanced Heart Failure (Cardiology)  Patient Active Problem List   Diagnosis Date Noted   Hematuria 02/15/2020   CKD (chronic kidney disease), stage III 02/15/2020   Constipation 02/13/2020   Anemia 02/13/2020   Secondary hypercoagulable state (HCC) 01/16/2020   AKI (acute kidney injury) (HCC) 03/14/2019   Spasm of muscle of lower back 03/14/2019   Acute renal failure (ARF) (HCC) 03/09/2019   Transaminitis    Acute on chronic renal failure (HCC) 03/08/2019   Acute exacerbation of CHF (congestive heart failure) (HCC) 11/23/2018   Acute on chronic heart failure (HCC) 10/19/2018   Pulmonary emphysema (HCC)    Chronic respiratory failure with hypoxia (HCC)    Morbid obesity (HCC) 09/09/2018   URI (upper respiratory infection) 08/31/2018   UTI (urinary tract infection) 08/31/2018   Chronic systolic (congestive) heart failure (HCC) 08/28/2018   CHF (congestive heart failure) (HCC) 08/14/2018   Acute on chronic combined systolic and diastolic CHF (congestive heart failure) (HCC) 07/18/2018   First degree AV block    Shortness of breath 04/29/2018   Acute HFrEF (heart failure with reduced ejection fraction) (HCC) 04/29/2018   Acute respiratory failure with hypoxia (HCC) 09/27/2017   CHF exacerbation (HCC) 09/27/2017   Renal insufficiency    Acute on chronic systolic CHF (congestive heart failure) (HCC) 07/08/2017   Left knee pain 07/08/2017   Acute bronchitis 05/10/2017   Acute congestive heart failure (HCC) 04/14/2017   Elevated troponin 04/14/2017   Lactic acidosis    Acute on chronic systolic heart failure, NYHA class 2 (HCC) 02/03/2017   Diabetes mellitus type 2 in obese  (HCC) 02/03/2017   CAP (community acquired pneumonia) 02/03/2017   Lobar pneumonia (HCC)    Diabetes mellitus with complication (HCC)    Acute on chronic respiratory failure with hypoxia (HCC)    Acute on chronic combined systolic and diastolic heart failure (HCC) 05/19/2016   Depression 05/19/2016   Gout 05/19/2016   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 , Disp: , Rfl:    amiodarone (PACERONE) 200 MG tablet, Take 1 tablet (200 mg total) by mouth daily., Disp: 90 tablet, Rfl: 3   apixaban (ELIQUIS) 2.5 MG TABS tablet, Take 1 tablet (2.5 mg total) by mouth 2 (two) times daily., Disp: , Rfl:    cyanocobalamin 1000 MCG tablet, Take 1 tablet (1,000 mcg total) by mouth daily., Disp: 90 tablet, Rfl: 0   diclofenac Sodium (VOLTAREN) 1 % GEL, Apply 4 g topically 4 (four) times daily., Disp: 50 g, Rfl: 0   glipiZIDE (GLUCOTROL) 5 MG tablet, Take 5 mg by mouth daily before breakfast., Disp: , Rfl:    hydrALAZINE (APRESOLINE) 25 MG  tablet, Take 0.5 tablets (12.5 mg total) by mouth every 8 (eight) hours., Disp: 90 tablet, Rfl: 0   ipratropium-albuterol (DUONEB) 0.5-2.5 (3) MG/3ML SOLN, Take 3 mLs by nebulization every 4 (four) hours as needed., Disp: 360 mL, Rfl: 0   isosorbide mononitrate (IMDUR) 30 MG 24 hr tablet, Take 1 tablet (30 mg total) by mouth daily., Disp: 30 tablet, Rfl: 0   levothyroxine (SYNTHROID) 125 MCG tablet,  Take 1 tablet (125 mcg total) by mouth daily before breakfast., Disp: 90 tablet, Rfl: 1   magnesium oxide (MAG-OX) 400 MG tablet, Take 400 mg by mouth daily. , Disp: , Rfl:    polyethylene glycol (MIRALAX / GLYCOLAX) 17 g packet, Take 17 g by mouth 2 (two) times daily., Disp: 14 each, Rfl: 0   rosuvastatin (CRESTOR) 10 MG tablet, Take 1 tablet (10 mg total) by mouth daily at 6 PM., Disp: 30 tablet, Rfl: 5   senna-docusate (SENOKOT-S) 8.6-50 MG tablet, Take 1 tablet by mouth 2 (two) times daily., Disp: 30 tablet, Rfl: 0   sertraline (ZOLOFT) 25 MG tablet, Take 25 mg by mouth daily. , Disp: , Rfl:    spironolactone (ALDACTONE) 25 MG tablet, Take 12.5 mg by mouth at bedtime., Disp: , Rfl:    tamsulosin (FLOMAX) 0.4 MG CAPS capsule, Take 0.4 mg by mouth at bedtime., Disp: , Rfl:    torsemide (DEMADEX) 20 MG tablet, Take 80 mg in the AM (4 tablets) and then 40 mg in the Evening (2 Tablets) Daily, Disp: 180 tablet, Rfl: 0   bisacodyl (DULCOLAX) 10 MG suppository, Place 1 suppository (10 mg total) rectally daily as needed for moderate constipation. (Patient not taking: Reported on 05/12/2020), Disp: 12 suppository, Rfl: 0   finasteride (PROSCAR) 5 MG tablet, Take 1 tablet (5 mg total) by mouth daily. (Patient not taking: Reported on 05/28/2020), Disp: 30 tablet, Rfl: 0   HYDROcodone-acetaminophen (NORCO/VICODIN) 5-325 MG tablet, Take 2 tablets by mouth every 4 (four) hours as needed. (Patient not taking: Reported on 05/28/2020), Disp: 6 tablet, Rfl: 0   methocarbamol (ROBAXIN) 500 MG tablet, Take 1 tablet (500 mg total) by mouth every 6 (six) hours as needed for muscle spasms. (Patient not taking: Reported on 05/28/2020), Disp: 30 tablet, Rfl: 0   pantoprazole (PROTONIX) 40 MG tablet, Take 1 tablet (40 mg total) by mouth daily. (Patient not taking: Reported on 03/31/2020), Disp: 30 tablet, Rfl: 11 Allergies  Allergen Reactions   Lisinopril Swelling    Facial swelling   Xarelto [Rivaroxaban] Other  (See Comments)    "Blood came out of my penis"      Social History   Socioeconomic History   Marital status: Married    Spouse name: Not on file   Number of children: Not on file   Years of education: Not on file   Highest education level: Not on file  Occupational History   Occupation: Retired    Comment: Former LPN  Tobacco Use   Smoking status: Former Smoker    Packs/day: 1.00    Years: 15.00    Pack years: 15.00    Types: Cigarettes, Cigars   Smokeless tobacco: Former Neurosurgeon   Tobacco comment: quit smoking ~ 50 yr ago  Building services engineer Use: Never used  Substance and Sexual Activity   Alcohol use: No    Alcohol/week: 2.0 standard drinks    Types: 2 Cans of beer per week    Comment: rare beer.   Drug use: No   Sexual  activity: Yes  Other Topics Concern   Not on file  Social History Narrative   Lives in [place] with wife. Moved from [place] (2013). Does not routinely exercise.   Social Determinants of Health   Financial Resource Strain:    Difficulty of Paying Living Expenses:   Food Insecurity:    Worried About Programme researcher, broadcasting/film/video in the Last Year:    Barista in the Last Year:   Transportation Needs:    Freight forwarder (Medical):    Lack of Transportation (Non-Medical):   Physical Activity:    Days of Exercise per Week:    Minutes of Exercise per Session:   Stress:    Feeling of Stress :   Social Connections:    Frequency of Communication with Friends and Family:    Frequency of Social Gatherings with Friends and Family:    Attends Religious Services:    Active Member of Clubs or Organizations:    Attends Banker Meetings:    Marital Status:   Intimate Partner Violence:    Fear of Current or Ex-Partner:    Emotionally Abused:    Physically Abused:    Sexually Abused:     Physical Exam      Future Appointments  Date Time Provider Department Center  06/20/2020  7:35 AM [facility]  DEVICE REMOTES [facility]  08/08/2020 10:20 AM Laurey Morale, MD [facility] None  08/20/2020 10:45 AM Felecia Shelling, DPM [facility]  09/19/2020  7:35 AM [facility] DEVICE REMOTES [facility]    BP 128/76    Pulse 80    Temp 98.2 F (36.8 C)    Resp 18    Wt 199 lb (90.3 kg)    SpO2 99%    BMI 31.17 kg/m   Weight yesterday-199 Last visit weight-196  Pt reports he is doing well,  [facility] he has one more day left, the outpt pharmacy will send clinic over request stating that he is still waiting on [place] to send it--he goes to [place] clinic next week and hope to get it straight at that point. He is out of the finasteride as well.  Pt denies sob, no dizziness, no c/p. He is wearing his compression hose, he does have some edema to top of hose line though, but not bad.  meds verified and pill boxes refilled for the next 2.5wks.  My partner Herbert Seta is going to check tomor to see if that rx got filled from HF clinic tomor at outpt pharmacy and p/u and take to him and place where needed since I am out tomor.   [name], EMT-Paramedic [phone] [facility] Paramedic  05/28/20

## 2020-05-29 ENCOUNTER — Other Ambulatory Visit (HOSPITAL_COMMUNITY): Payer: Self-pay

## 2020-05-29 MED ORDER — AMIODARONE HCL 200 MG PO TABS: 200.0000 mg | ORAL_TABLET | Freq: Every day | ORAL | 3 refills | Status: DC

## 2020-05-29 MED FILL — AMIODARONE HCL 200 MG TAB: 200 | 30 days supply | Qty: 30 | Fill #0

## 2020-05-29 NOTE — Progress Notes (Signed)
Ryan Macdonald was seen at home today to deliver medication from the outpatient pharmacy. He was given amiodarone and it was added to his pill boxes in accordance with the note which was taped to the outside of one. Nothing further was needed at this time. Vincent Gros will follow up.  Jacqualine Code, EMT 05/29/20

## 2020-06-16 ENCOUNTER — Other Ambulatory Visit (HOSPITAL_COMMUNITY): Payer: Self-pay

## 2020-06-16 NOTE — Progress Notes (Signed)
Paramedicine Encounter    Patient ID: Ryan Macdonald, male    DOB: 09-07-1934, 86 y.o.   MRN: [id number]   Patient Care Team: [facility], [facility] as [facility]CP - General (General Practice) [name], MD as PCP - Cardiology (Cardiology) Laurey Morale, MD as PCP - Advanced Heart Failure (Cardiology)  Patient Active Problem List   Diagnosis Date Noted   Hematuria 02/15/2020   CKD (chronic kidney disease), stage III 02/15/2020   Constipation 02/13/2020   Anemia 02/13/2020   Secondary hypercoagulable state (HCC) 01/16/2020   AKI (acute kidney injury) (HCC) 03/14/2019   Spasm of muscle of lower back 03/14/2019   Acute renal failure (ARF) (HCC) 03/09/2019   Transaminitis    Acute on chronic renal failure (HCC) 03/08/2019   Acute exacerbation of CHF (congestive heart failure) (HCC) 11/23/2018   Acute on chronic heart failure (HCC) 10/19/2018   Pulmonary emphysema (HCC)    Chronic respiratory failure with hypoxia (HCC)    Morbid obesity (HCC) 09/09/2018   URI (upper respiratory infection) 08/31/2018   UTI (urinary tract infection) 08/31/2018   Chronic systolic (congestive) heart failure (HCC) 08/28/2018   CHF (congestive heart failure) (HCC) 08/14/2018   Acute on chronic combined systolic and diastolic CHF (congestive heart failure) (HCC) 07/18/2018   First degree AV block    Shortness of breath 04/29/2018   Acute HFrEF (heart failure with reduced ejection fraction) (HCC) 04/29/2018   Acute respiratory failure with hypoxia (HCC) 09/27/2017   CHF exacerbation (HCC) 09/27/2017   Renal insufficiency    Acute on chronic systolic CHF (congestive heart failure) (HCC) 07/08/2017   Left knee pain 07/08/2017   Acute bronchitis 05/10/2017   Acute congestive heart failure (HCC) 04/14/2017   Elevated troponin 04/14/2017   Lactic acidosis    Acute on chronic systolic heart failure, NYHA class 2 (HCC) 02/03/2017   Diabetes mellitus type 2 in obese  (HCC) 02/03/2017   CAP (community acquired pneumonia) 02/03/2017   Lobar pneumonia (HCC)    Diabetes mellitus with complication (HCC)    Acute on chronic respiratory failure with hypoxia (HCC)    Acute on chronic combined systolic and diastolic heart failure (HCC) 05/19/2016   Depression 05/19/2016   Gout 05/19/2016   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 , Disp: , Rfl:    amiodarone (PACERONE) 200 MG tablet, Take 1 tablet (200 mg total) by mouth daily., Disp: 90 tablet, Rfl: 3   apixaban (ELIQUIS) 2.5 MG TABS tablet, Take 1 tablet (2.5 mg total) by mouth 2 (two) times daily., Disp: , Rfl:    cyanocobalamin 1000 MCG tablet, Take 1 tablet (1,000 mcg total) by mouth daily., Disp: 90 tablet, Rfl: 0   diclofenac Sodium (VOLTAREN) 1 % GEL, Apply 4 g topically 4 (four) times daily., Disp: 50 g, Rfl: 0   glipiZIDE (GLUCOTROL) 5 MG tablet, Take 5 mg by mouth daily before breakfast., Disp: , Rfl:    hydrALAZINE (APRESOLINE) 25 MG  tablet, Take 0.5 tablets (12.5 mg total) by mouth every 8 (eight) hours., Disp: 90 tablet, Rfl: 0   ipratropium-albuterol (DUONEB) 0.5-2.5 (3) MG/3ML SOLN, Take 3 mLs by nebulization every 4 (four) hours as needed., Disp: 360 mL, Rfl: 0   isosorbide mononitrate (IMDUR) 30 MG 24 hr tablet, Take 1 tablet (30 mg total) by mouth daily., Disp: 30 tablet, Rfl: 0   levothyroxine (SYNTHROID) 125 MCG tablet,  Take 1 tablet (125 mcg total) by mouth daily before breakfast., Disp: 90 tablet, Rfl: 1   magnesium oxide (MAG-OX) 400 MG tablet, Take 400 mg by mouth daily. , Disp: , Rfl:    polyethylene glycol (MIRALAX / GLYCOLAX) 17 g packet, Take 17 g by mouth 2 (two) times daily., Disp: 14 each, Rfl: 0   rosuvastatin (CRESTOR) 10 MG tablet, Take 1 tablet (10 mg total) by mouth daily at 6 PM., Disp: 30 tablet, Rfl: 5   senna-docusate (SENOKOT-S) 8.6-50 MG tablet, Take 1 tablet by mouth 2 (two) times daily., Disp: 30 tablet, Rfl: 0   sertraline (ZOLOFT) 25 MG tablet, Take 25 mg by mouth daily. , Disp: , Rfl:    spironolactone (ALDACTONE) 25 MG tablet, Take 12.5 mg by mouth at bedtime., Disp: , Rfl:    tamsulosin (FLOMAX) 0.4 MG CAPS capsule, Take 0.4 mg by mouth at bedtime., Disp: , Rfl:    torsemide (DEMADEX) 20 MG tablet, Take 80 mg in the AM (4 tablets) and then 40 mg in the Evening (2 Tablets) Daily, Disp: 180 tablet, Rfl: 0   bisacodyl (DULCOLAX) 10 MG suppository, Place 1 suppository (10 mg total) rectally daily as needed for moderate constipation. (Patient not taking: Reported on 05/12/2020), Disp: 12 suppository, Rfl: 0   finasteride (PROSCAR) 5 MG tablet, Take 1 tablet (5 mg total) by mouth daily. (Patient not taking: Reported on 05/28/2020), Disp: 30 tablet, Rfl: 0   HYDROcodone-acetaminophen (NORCO/VICODIN) 5-325 MG tablet, Take 2 tablets by mouth every 4 (four) hours as needed. (Patient not taking: Reported on 05/28/2020), Disp: 6 tablet, Rfl: 0   methocarbamol (ROBAXIN) 500 MG tablet, Take 1 tablet (500 mg total) by mouth every 6 (six) hours as needed for muscle spasms. (Patient not taking: Reported on 05/28/2020), Disp: 30 tablet, Rfl: 0   pantoprazole (PROTONIX) 40 MG tablet, Take 1 tablet (40 mg total) by mouth daily. (Patient not taking: Reported on 03/31/2020), Disp: 30 tablet, Rfl: 11 Allergies  Allergen Reactions   Lisinopril Swelling    Facial swelling   Xarelto [Rivaroxaban] Other  (See Comments)    "Blood came out of my penis"      Social History   Socioeconomic History   Marital status: Married    Spouse name: Not on file   Number of children: Not on file   Years of education: Not on file   Highest education level: Not on file  Occupational History   Occupation: Retired    Comment: Former LPN  Tobacco Use   Smoking status: Former Smoker    Packs/day: 1.00    Years: 15.00    Pack years: 15.00    Types: Cigarettes, Cigars   Smokeless tobacco: Former Neurosurgeon   Tobacco comment: quit smoking ~ 50 yr ago  Building services engineer Use: Never used  Substance and Sexual Activity   Alcohol use: No    Alcohol/week: 2.0 standard drinks    Types: 2 Cans of beer per week    Comment: rare beer.   Drug use: No   Sexual  activity: Yes  Other Topics Concern   Not on file  Social History Narrative   Lives in [place] with wife. Moved from [place] (2013). Does not routinely exercise.   Social Determinants of Health   Financial Resource Strain:    Difficulty of Paying Living Expenses:   Food Insecurity:    Worried About Programme researcher, broadcasting/film/video in the Last Year:    Barista in the Last Year:   Transportation Needs:    Freight forwarder (Medical):    Lack of Transportation (Non-Medical):   Physical Activity:    Days of Exercise per Week:    Minutes of Exercise per Session:   Stress:    Feeling of Stress :   Social Connections:    Frequency of Communication with Friends and Family:    Frequency of Social Gatherings with Friends and Family:    Attends Religious Services:    Active Member of Clubs or Organizations:    Attends Banker Meetings:    Marital Status:   Intimate Partner Violence:    Fear of Current or Ex-Partner:    Emotionally Abused:    Physically Abused:    Sexually Abused:     Physical Exam      Future Appointments  Date Time Provider Department Center  06/20/2020  7:35 AM [facility]  DEVICE REMOTES [facility]  08/08/2020 10:20 AM Laurey Morale, MD [facility] None  08/20/2020 10:45 AM Felecia Shelling, DPM [facility]  09/19/2020  7:35 AM [facility] DEVICE REMOTES [facility]    BP 122/64    Pulse 85    Temp 98 F (36.7 C)    Resp 17    Wt 199 lb (90.3 kg)    SpO2 95%    BMI 31.17 kg/m   Weight yesterday-199 Last visit weight-199  Pt was asleep in bed when I arrived. Pts wife reports that he is sleeping a lot and his appetite is poor which is very abnormal for him.  Weights are running between 199-204. He states sometimes he gets like this where he doesn't have much of an appetite, it comes and goes. He denies its for any particular reason.  He denies sob, he goes to [place] next week for f/u. He denies c/p, no dizziness.  He has 2 wks left of amio.  He is still out of the finasteride.  2 wks and 1 day filled up in pill boxes.  He has edema above his compression stockings.  He states he is elevating his legs lying in bed. I suspect he eats things high is salt when he does eat.  He reports eating a lot of ice during the day.  I asked him to monitor his fluid intake and be careful.  [facility].   [name], EMT-Paramedic [phone] [facility] Paramedic  06/17/20

## 2020-06-20 ENCOUNTER — Ambulatory Visit (INDEPENDENT_AMBULATORY_CARE_PROVIDER_SITE_OTHER): Payer: No Typology Code available for payment source | Admitting: *Deleted

## 2020-06-20 DIAGNOSIS — I428 Other cardiomyopathies: Secondary | ICD-10-CM

## 2020-06-20 DIAGNOSIS — I5023 Acute on chronic systolic (congestive) heart failure: Secondary | ICD-10-CM | POA: Diagnosis not present

## 2020-06-20 LAB — CUP PACEART REMOTE DEVICE CHECK
Battery Remaining Percentage: 55 %
Battery Voltage: 3.11 V
Brady Statistic RV Percent Paced: 0 %
Date Time Interrogation Session: 20210723061211
HighPow Impedance: 84 Ohm
Implantable Lead Implant Date: 20161221
Implantable Lead Location: 753860
Implantable Lead Model: 365
Implantable Lead Serial Number: 10600411
Implantable Pulse Generator Implant Date: 20161221
Lead Channel Impedance Value: 462 Ohm
Lead Channel Sensing Intrinsic Amplitude: 0.9 mV
Lead Channel Sensing Intrinsic Amplitude: 14.5 mV
Lead Channel Setting Pacing Amplitude: 2.5 V
Lead Channel Setting Pacing Pulse Width: 0.4 ms
Pulse Gen Model: 399436
Pulse Gen Serial Number: 60886010

## 2020-06-23 NOTE — Progress Notes (Signed)
Remote ICD transmission.   

## 2020-07-01 ENCOUNTER — Other Ambulatory Visit (HOSPITAL_COMMUNITY): Payer: Self-pay

## 2020-07-01 MED FILL — AMIODARONE HCL 200 MG TAB: 200 | 30 days supply | Qty: 30 | Fill #1

## 2020-07-01 NOTE — Progress Notes (Signed)
Paramedicine Encounter    Patient ID: Ryan Macdonald, male    DOB: May 11, 1934, 86 y.o.   MRN: [id number]   Patient Care Team: [facility], [facility] as PCP - General (General Practice) [name], MD as PCP - Cardiology (Cardiology) Laurey Morale, MD as PCP - Advanced Heart Failure (Cardiology)  Patient Active Problem List   Diagnosis Date Noted  . Hematuria 02/15/2020  . CKD (chronic kidney disease), stage III 02/15/2020  . Constipation 02/13/2020  . Anemia 02/13/2020  . Secondary hypercoagulable state (HCC) 01/16/2020  . AKI (acute kidney injury) (HCC) 03/14/2019  . Spasm of muscle of lower back 03/14/2019  . Acute renal failure (ARF) (HCC) 03/09/2019  . Transaminitis   . Acute on chronic renal failure (HCC) 03/08/2019  . Acute exacerbation of CHF (congestive heart failure) (HCC) 11/23/2018  . Acute on chronic heart failure (HCC) 10/19/2018  . Pulmonary emphysema (HCC)   . Chronic respiratory failure with hypoxia (HCC)   . Morbid obesity (HCC) 09/09/2018  . URI (upper respiratory infection) 08/31/2018  . UTI (urinary tract infection) 08/31/2018  . Chronic systolic (congestive) heart failure (HCC) 08/28/2018  . CHF (congestive heart failure) (HCC) 08/14/2018  . Acute on chronic combined systolic and diastolic CHF (congestive heart failure) (HCC) 07/18/2018  . First degree AV block   . Shortness of breath 04/29/2018  . Acute HFrEF (heart failure with reduced ejection fraction) (HCC) 04/29/2018  . Acute respiratory failure with hypoxia (HCC) 09/27/2017  . CHF exacerbation (HCC) 09/27/2017  . Renal insufficiency   . Acute on chronic systolic CHF (congestive heart failure) (HCC) 07/08/2017  . Left knee pain 07/08/2017  . Acute bronchitis 05/10/2017  . Acute congestive heart failure (HCC) 04/14/2017  . Elevated troponin 04/14/2017  . Lactic acidosis   . Acute on chronic systolic heart failure, NYHA class 2 (HCC) 02/03/2017  . Diabetes mellitus type 2 in obese  (HCC) 02/03/2017  . CAP (community acquired pneumonia) 02/03/2017  . Lobar pneumonia (HCC)   . Diabetes mellitus with complication (HCC)   . Acute on chronic respiratory failure with hypoxia (HCC)   . Acute on chronic combined systolic and diastolic heart failure (HCC) 05/19/2016  . Depression 05/19/2016  . Gout 05/19/2016  . 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 NSVT (nonsustained ventricular tachycardia) (HCC)   . Chronic renal insufficiency, stage 3 (moderate) (HCC)   . Hyperkalemia 08/21/2015  . Pulmonary embolism (HCC)   . Acute respiratory failure (HCC)   . Osteoarthritis of left hip 10/25/2013  . Atrial fibrillation (HCC) 10/25/2013  . S/P hip replacement 10/24/2013  . Nonischemic cardiomyopathy (HCC)   . Obesity (BMI 30-39.9)   . Essential hypertension   . Hyperlipidemia     Current Outpatient Medications:  .  allopurinol (ZYLOPRIM) 100 MG tablet, Take 200 mg by mouth daily. , Disp: , Rfl:  .  amiodarone (PACERONE) 200 MG tablet, Take 1 tablet (200 mg total) by mouth daily., Disp: 90 tablet, Rfl: 3 .  apixaban (ELIQUIS) 2.5 MG TABS tablet, Take 1 tablet (2.5 mg total) by mouth 2 (two) times daily., Disp: , Rfl:  .  cyanocobalamin 1000 MCG tablet, Take 1 tablet (1,000 mcg total) by mouth daily., Disp: 90 tablet, Rfl: 0 .  diclofenac Sodium (VOLTAREN) 1 % GEL, Apply 4 g topically 4 (four) times daily., Disp: 50 g, Rfl: 0 .  glipiZIDE (GLUCOTROL) 5 MG tablet, Take 5 mg by mouth daily before breakfast., Disp: , Rfl:  .  hydrALAZINE (APRESOLINE) 25 MG  tablet, Take 0.5 tablets (12.5 mg total) by mouth every 8 (eight) hours., Disp: 90 tablet, Rfl: 0 .  ipratropium-albuterol (DUONEB) 0.5-2.5 (3) MG/3ML SOLN, Take 3 mLs by nebulization every 4 (four) hours as needed., Disp: 360 mL, Rfl: 0 .  isosorbide mononitrate (IMDUR) 30 MG 24 hr tablet, Take 1 tablet (30 mg total) by mouth daily., Disp: 30 tablet, Rfl: 0 .  levothyroxine (SYNTHROID) 125 MCG tablet,  Take 1 tablet (125 mcg total) by mouth daily before breakfast., Disp: 90 tablet, Rfl: 1 .  magnesium oxide (MAG-OX) 400 MG tablet, Take 400 mg by mouth daily. , Disp: , Rfl:  .  polyethylene glycol (MIRALAX / GLYCOLAX) 17 g packet, Take 17 g by mouth 2 (two) times daily., Disp: 14 each, Rfl: 0 .  rosuvastatin (CRESTOR) 10 MG tablet, Take 1 tablet (10 mg total) by mouth daily at 6 PM., Disp: 30 tablet, Rfl: 5 .  sertraline (ZOLOFT) 25 MG tablet, Take 25 mg by mouth daily. , Disp: , Rfl:  .  spironolactone (ALDACTONE) 25 MG tablet, Take 12.5 mg by mouth at bedtime., Disp: , Rfl:  .  tamsulosin (FLOMAX) 0.4 MG CAPS capsule, Take 0.4 mg by mouth at bedtime., Disp: , Rfl:  .  torsemide (DEMADEX) 20 MG tablet, Take 80 mg in the AM (4 tablets) and then 40 mg in the Evening (2 Tablets) Daily, Disp: 180 tablet, Rfl: 0 .  bisacodyl (DULCOLAX) 10 MG suppository, Place 1 suppository (10 mg total) rectally daily as needed for moderate constipation. (Patient not taking: Reported on 05/12/2020), Disp: 12 suppository, Rfl: 0 .  finasteride (PROSCAR) 5 MG tablet, Take 1 tablet (5 mg total) by mouth daily. (Patient not taking: Reported on 05/28/2020), Disp: 30 tablet, Rfl: 0 .  HYDROcodone-acetaminophen (NORCO/VICODIN) 5-325 MG tablet, Take 2 tablets by mouth every 4 (four) hours as needed. (Patient not taking: Reported on 05/28/2020), Disp: 6 tablet, Rfl: 0 .  methocarbamol (ROBAXIN) 500 MG tablet, Take 1 tablet (500 mg total) by mouth every 6 (six) hours as needed for muscle spasms. (Patient not taking: Reported on 05/28/2020), Disp: 30 tablet, Rfl: 0 .  pantoprazole (PROTONIX) 40 MG tablet, Take 1 tablet (40 mg total) by mouth daily. (Patient not taking: Reported on 03/31/2020), Disp: 30 tablet, Rfl: 11 .  senna-docusate (SENOKOT-S) 8.6-50 MG tablet, Take 1 tablet by mouth 2 (two) times daily. (Patient not taking: Reported on 07/01/2020), Disp: 30 tablet, Rfl: 0 Allergies  Allergen Reactions  . Lisinopril Swelling     Facial swelling  . Xarelto [Rivaroxaban] Other (See Comments)    "Blood came out of my penis"      Social History   Socioeconomic History  . Marital status: Married    Spouse name: Not on file  . Number of children: Not on file  . Years of education: Not on file  . Highest education level: Not on file  Occupational History  . Occupation: Retired    Comment: Former Public house manager  Tobacco Use  . Smoking status: Former Smoker    Packs/day: 1.00    Years: 15.00    Pack years: 15.00    Types: Cigarettes, Cigars  . Smokeless tobacco: Former Neurosurgeon  . Tobacco comment: quit smoking ~ 50 yr ago  Vaping Use  . Vaping Use: Never used  Substance and Sexual Activity  . Alcohol use: No    Alcohol/week: 2.0 standard drinks    Types: 2 Cans of beer per week    Comment: rare beer.  Marland Kitchen  Drug use: No  . Sexual activity: Yes  Other Topics Concern  . Not on file  Social History Narrative   Lives in [place] with wife. Moved from [place] (2013). Does not routinely exercise.   Social Determinants of Health   Financial Resource Strain:   . Difficulty of Paying Living Expenses:   Food Insecurity:   . Worried About Programme researcher, broadcasting/film/video in the Last Year:   . Barista in the Last Year:   Transportation Needs:   . Freight forwarder (Medical):   Marland Kitchen Lack of Transportation (Non-Medical):   Physical Activity:   . Days of Exercise per Week:   . Minutes of Exercise per Session:   Stress:   . Feeling of Stress :   Social Connections:   . Frequency of Communication with Friends and Family:   . Frequency of Social Gatherings with Friends and Family:   . Attends Religious Services:   . Active Member of Clubs or Organizations:   . Attends Banker Meetings:   Marland Kitchen Marital Status:   Intimate Partner Violence:   . Fear of Current or Ex-Partner:   . Emotionally Abused:   Marland Kitchen Physically Abused:   . Sexually Abused:     Physical Exam      Future Appointments  Date Time Provider  Department Center  08/08/2020 10:20 AM Laurey Morale, MD [facility] None  08/20/2020 10:45 AM Felecia Shelling, DPM [facility]  09/19/2020  7:35 AM [facility] DEVICE REMOTES [facility]  12/19/2020  7:35 AM [facility] DEVICE REMOTES [facility]  03/20/2021  7:35 AM [facility] DEVICE REMOTES [facility]  06/19/2021  7:35 AM [facility] DEVICE REMOTES [facility]  09/18/2021  7:35 AM [facility] DEVICE REMOTES [facility]    BP 116/60   Pulse 80   Temp (!) 97.2 F (36.2 C)   Resp 18   Wt 201 lb (91.2 kg)   SpO2 96%   BMI 31.48 kg/m   Weight yesterday-201 Last visit weight-199  Pt was seen at [facility] last week, he has UTI, she prescribed antibiotics. He has not gotten the rx yet-he spoke to [place] today and they told pt it is on the way.   He reports tiredness, decreased appetite, but denies sob. No dizziness. No c/p.  He does have a lot of swelling above his compression stockings.  He is out of the amio---called in to pharmacy and I will p/u tomor.  He is still out of the finasteride--he said the PCP refilled it last week. Spoke to after hours nurse and she advised it wasn't even on his list to take. She did send over rx request to dr Tereasa Coop for a refill. His antibiotic was sent out yesterday so he should get it soon.   [name], EMT-Paramedic [phone] [facility] Paramedic  07/01/20

## 2020-07-02 ENCOUNTER — Other Ambulatory Visit (HOSPITAL_COMMUNITY): Payer: Self-pay

## 2020-07-02 NOTE — Progress Notes (Signed)
Came out today for med rec post pharmacy p/u of his amio.  Placed in pill boxes.   [name], EMT-Paramedic  07/02/20

## 2020-07-21 ENCOUNTER — Telehealth (HOSPITAL_COMMUNITY): Payer: Self-pay | Admitting: *Deleted

## 2020-07-21 ENCOUNTER — Other Ambulatory Visit (HOSPITAL_COMMUNITY): Payer: Self-pay

## 2020-07-21 NOTE — Progress Notes (Signed)
Paramedicine Encounter    Patient ID: Ryan Macdonald, male    DOB: 1933-12-21, 87 y.o.   MRN: [id number]   Patient Care Team: [facility], [facility] as PCP - General (General Practice) [name], MD as PCP - Cardiology (Cardiology) Laurey Morale, MD as PCP - Advanced Heart Failure (Cardiology)  Patient Active Problem List   Diagnosis Date Noted  . Hematuria 02/15/2020  . CKD (chronic kidney disease), stage III 02/15/2020  . Constipation 02/13/2020  . Anemia 02/13/2020  . Secondary hypercoagulable state (HCC) 01/16/2020  . AKI (acute kidney injury) (HCC) 03/14/2019  . Spasm of muscle of lower back 03/14/2019  . Acute renal failure (ARF) (HCC) 03/09/2019  . Transaminitis   . Acute on chronic renal failure (HCC) 03/08/2019  . Acute exacerbation of CHF (congestive heart failure) (HCC) 11/23/2018  . Acute on chronic heart failure (HCC) 10/19/2018  . Pulmonary emphysema (HCC)   . Chronic respiratory failure with hypoxia (HCC)   . Morbid obesity (HCC) 09/09/2018  . URI (upper respiratory infection) 08/31/2018  . UTI (urinary tract infection) 08/31/2018  . Chronic systolic (congestive) heart failure (HCC) 08/28/2018  . CHF (congestive heart failure) (HCC) 08/14/2018  . Acute on chronic combined systolic and diastolic CHF (congestive heart failure) (HCC) 07/18/2018  . First degree AV block   . Shortness of breath 04/29/2018  . Acute HFrEF (heart failure with reduced ejection fraction) (HCC) 04/29/2018  . Acute respiratory failure with hypoxia (HCC) 09/27/2017  . CHF exacerbation (HCC) 09/27/2017  . Renal insufficiency   . Acute on chronic systolic CHF (congestive heart failure) (HCC) 07/08/2017  . Left knee pain 07/08/2017  . Acute bronchitis 05/10/2017  . Acute congestive heart failure (HCC) 04/14/2017  . Elevated troponin 04/14/2017  . Lactic acidosis   . Acute on chronic systolic heart failure, NYHA class 2 (HCC) 02/03/2017  . Diabetes mellitus type 2 in obese  (HCC) 02/03/2017  . CAP (community acquired pneumonia) 02/03/2017  . Lobar pneumonia (HCC)   . Diabetes mellitus with complication (HCC)   . Acute on chronic respiratory failure with hypoxia (HCC)   . Acute on chronic combined systolic and diastolic heart failure (HCC) 05/19/2016  . Depression 05/19/2016  . Gout 05/19/2016  . 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 NSVT (nonsustained ventricular tachycardia) (HCC)   . Chronic renal insufficiency, stage 3 (moderate) (HCC)   . Hyperkalemia 08/21/2015  . Pulmonary embolism (HCC)   . Acute respiratory failure (HCC)   . Osteoarthritis of left hip 10/25/2013  . Atrial fibrillation (HCC) 10/25/2013  . S/P hip replacement 10/24/2013  . Nonischemic cardiomyopathy (HCC)   . Obesity (BMI 30-39.9)   . Essential hypertension   . Hyperlipidemia     Current Outpatient Medications:  .  allopurinol (ZYLOPRIM) 100 MG tablet, Take 200 mg by mouth daily. , Disp: , Rfl:  .  amiodarone (PACERONE) 200 MG tablet, Take 1 tablet (200 mg total) by mouth daily., Disp: 90 tablet, Rfl: 3 .  apixaban (ELIQUIS) 2.5 MG TABS tablet, Take 1 tablet (2.5 mg total) by mouth 2 (two) times daily., Disp: , Rfl:  .  bisacodyl (DULCOLAX) 10 MG suppository, Place 1 suppository (10 mg total) rectally daily as needed for moderate constipation. (Patient not taking: Reported on 05/12/2020), Disp: 12 suppository, Rfl: 0 .  cyanocobalamin 1000 MCG tablet, Take 1 tablet (1,000 mcg total) by mouth daily., Disp: 90 tablet, Rfl: 0 .  diclofenac Sodium (VOLTAREN) 1 % GEL, Apply 4 g topically 4 (four) times  daily., Disp: 50 g, Rfl: 0 .  finasteride (PROSCAR) 5 MG tablet, Take 1 tablet (5 mg total) by mouth daily. (Patient not taking: Reported on 05/28/2020), Disp: 30 tablet, Rfl: 0 .  glipiZIDE (GLUCOTROL) 5 MG tablet, Take 5 mg by mouth daily before breakfast., Disp: , Rfl:  .  hydrALAZINE (APRESOLINE) 25 MG tablet, Take 0.5 tablets (12.5 mg total) by mouth every  8 (eight) hours., Disp: 90 tablet, Rfl: 0 .  HYDROcodone-acetaminophen (NORCO/VICODIN) 5-325 MG tablet, Take 2 tablets by mouth every 4 (four) hours as needed. (Patient not taking: Reported on 05/28/2020), Disp: 6 tablet, Rfl: 0 .  ipratropium-albuterol (DUONEB) 0.5-2.5 (3) MG/3ML SOLN, Take 3 mLs by nebulization every 4 (four) hours as needed., Disp: 360 mL, Rfl: 0 .  isosorbide mononitrate (IMDUR) 30 MG 24 hr tablet, Take 1 tablet (30 mg total) by mouth daily., Disp: 30 tablet, Rfl: 0 .  levothyroxine (SYNTHROID) 125 MCG tablet, Take 1 tablet (125 mcg total) by mouth daily before breakfast., Disp: 90 tablet, Rfl: 1 .  magnesium oxide (MAG-OX) 400 MG tablet, Take 400 mg by mouth daily. , Disp: , Rfl:  .  methocarbamol (ROBAXIN) 500 MG tablet, Take 1 tablet (500 mg total) by mouth every 6 (six) hours as needed for muscle spasms. (Patient not taking: Reported on 05/28/2020), Disp: 30 tablet, Rfl: 0 .  pantoprazole (PROTONIX) 40 MG tablet, Take 1 tablet (40 mg total) by mouth daily. (Patient not taking: Reported on 03/31/2020), Disp: 30 tablet, Rfl: 11 .  polyethylene glycol (MIRALAX / GLYCOLAX) 17 g packet, Take 17 g by mouth 2 (two) times daily., Disp: 14 each, Rfl: 0 .  rosuvastatin (CRESTOR) 10 MG tablet, Take 1 tablet (10 mg total) by mouth daily at 6 PM., Disp: 30 tablet, Rfl: 5 .  senna-docusate (SENOKOT-S) 8.6-50 MG tablet, Take 1 tablet by mouth 2 (two) times daily. (Patient not taking: Reported on 07/01/2020), Disp: 30 tablet, Rfl: 0 .  sertraline (ZOLOFT) 25 MG tablet, Take 25 mg by mouth daily. , Disp: , Rfl:  .  spironolactone (ALDACTONE) 25 MG tablet, Take 12.5 mg by mouth at bedtime., Disp: , Rfl:  .  tamsulosin (FLOMAX) 0.4 MG CAPS capsule, Take 0.4 mg by mouth at bedtime., Disp: , Rfl:  .  torsemide (DEMADEX) 20 MG tablet, Take 80 mg in the AM (4 tablets) and then 40 mg in the Evening (2 Tablets) Daily, Disp: 180 tablet, Rfl: 0 Allergies  Allergen Reactions  . Lisinopril Swelling    Facial  swelling  . Xarelto [Rivaroxaban] Other (See Comments)    "Blood came out of my penis"      Social History   Socioeconomic History  . Marital status: Married    Spouse name: Not on file  . Number of children: Not on file  . Years of education: Not on file  . Highest education level: Not on file  Occupational History  . Occupation: Retired    Comment: Former Public house manager  Tobacco Use  . Smoking status: Former Smoker    Packs/day: 1.00    Years: 15.00    Pack years: 15.00    Types: Cigarettes, Cigars  . Smokeless tobacco: Former Neurosurgeon  . Tobacco comment: quit smoking ~ 50 yr ago  Vaping Use  . Vaping Use: Never used  Substance and Sexual Activity  . Alcohol use: No    Alcohol/week: 2.0 standard drinks    Types: 2 Cans of beer per week    Comment: rare beer.  Marland Kitchen  Drug use: No  . Sexual activity: Yes  Other Topics Concern  . Not on file  Social History Narrative   Lives in [place] with wife. Moved from [place] (2013). Does not routinely exercise.   Social Determinants of Health   Financial Resource Strain:   . Difficulty of Paying Living Expenses: Not on file  Food Insecurity:   . Worried About Programme researcher, broadcasting/film/video in the Last Year: Not on file  . Ran Out of Food in the Last Year: Not on file  Transportation Needs:   . Lack of Transportation (Medical): Not on file  . Lack of Transportation (Non-Medical): Not on file  Physical Activity:   . Days of Exercise per Week: Not on file  . Minutes of Exercise per Session: Not on file  Stress:   . Feeling of Stress : Not on file  Social Connections:   . Frequency of Communication with Friends and Family: Not on file  . Frequency of Social Gatherings with Friends and Family: Not on file  . Attends Religious Services: Not on file  . Active Member of Clubs or Organizations: Not on file  . Attends Banker Meetings: Not on file  . Marital Status: Not on file  Intimate Partner Violence:   . Fear of Current or Ex-Partner:  Not on file  . Emotionally Abused: Not on file  . Physically Abused: Not on file  . Sexually Abused: Not on file    Physical Exam      Future Appointments  Date Time Provider Department Center  08/08/2020 10:20 AM Laurey Morale, MD [facility] None  08/20/2020 10:45 AM Felecia Shelling, DPM [facility]  09/19/2020  7:35 AM [facility] DEVICE REMOTES [facility]  12/19/2020  7:35 AM [facility] DEVICE REMOTES [facility]  03/20/2021  7:35 AM [facility] DEVICE REMOTES [facility]  06/19/2021  7:35 AM [facility] DEVICE REMOTES [facility]  09/18/2021  7:35 AM [facility]ICE [facility]OT[facility]    BP 112/60   Pulse 88   Temp 98 F (36.7 C)   Resp 20   Wt 216 lb (98 kg)   SpO2 95%   BMI 33.83 kg/m   Weight yesterday-205 Last visit weight-201  Pt called me to report his legs are swollen and his weight is up and he was asking about nursing orders to come out to wrap his legs.  His weight has been around 199 the past few visits.  Today his weight is 216. Legs are very swollen, the left one edema noted to above the knee, the right one below knee swelling. According to his scales it does hold the memory from yesterday and I checked it and said 205lbs.  [facility] but since it has been more than 2wks since he had his labs done at the [place] so IV lasix for today is out.  I made visit earlier than planned due to his phone call.  He denies increased sob, he denies any complaints. No fatigue. No trouble sleeping. Sleeping on 2-3 pillows but states that is normal for him.  Wife reports he is using his 02 more than usual.  Appetite decreased. He states that has been going off and on for a while.  The home aide is here and is putting on the compression stockings.  Contacted clinic and jasmine spoke to amy and she reports to have him increase his torsemide to 80/80 X 2 days.  So  that was  placed accordingly in pill box. I explained to him that to be sure to take the meds out of the right day of the week.  He is going to [place] on wed-thurs for appointment-I told him to be sure they recheck labs. To relay he needs the amio filled from the [place] pharmacy and he is going to call me tomor and wed morning to let me know how his weight is.  Urinating well.  -meds verified and pill box refilled.    [name], EMT-Paramedic [phone] [facility] Paramedic  07/21/20

## 2020-07-21 NOTE — Telephone Encounter (Signed)
Katie w/paramedicine called to report pts 11lb weight gain overnight. Pt has BLEE and distended abdomen. Per Amy Clegg,NP have pt take torsemide 80mg  bid for 2 days. Katie aware.

## 2020-07-22 ENCOUNTER — Telehealth (HOSPITAL_COMMUNITY): Payer: Self-pay

## 2020-07-22 NOTE — Telephone Encounter (Signed)
I called pt for f/u after his torsemide was increased yesterday. He went to get on scales during our phone call and he said it was 206lbs today. He will take that additional 20mg  torsemide plus his routine dose this afternoon.  Pt denies any complaints, legs feel better, no sob.   , EMT-Paramedic  07/22/20

## 2020-07-29 ENCOUNTER — Telehealth (HOSPITAL_COMMUNITY): Payer: Self-pay

## 2020-07-29 NOTE — Telephone Encounter (Signed)
Spoke to pts wife today regarding home visit but she reports he has been in the [place] hosp since last Wednesday receiving IV lasix. He may be able to come home Wednesday or Thursday.  Will f/u and wife is suppose to let me know once he gets back.   [name], EMT-Paramedic  07/29/20

## 2020-07-31 ENCOUNTER — Other Ambulatory Visit (HOSPITAL_COMMUNITY): Payer: Self-pay

## 2020-07-31 NOTE — Progress Notes (Signed)
Paramedicine Encounter    Patient ID: Ryan Macdonald, male    DOB: 06-23-1934, 86 y.o.   MRN: [id number]   Patient Care Team: [facility], [facility] as [facility]CP - General (General Practice) [name], MD as PCP - Cardiology (Cardiology) Laurey Morale, MD as PCP - Advanced Heart Failure (Cardiology)  Patient Active Problem List   Diagnosis Date Noted   Hematuria 02/15/2020   CKD (chronic kidney disease), stage III 02/15/2020   Constipation 02/13/2020   Anemia 02/13/2020   Secondary hypercoagulable state (HCC) 01/16/2020   AKI (acute kidney injury) (HCC) 03/14/2019   Spasm of muscle of lower back 03/14/2019   Acute renal failure (ARF) (HCC) 03/09/2019   Transaminitis    Acute on chronic renal failure (HCC) 03/08/2019   Acute exacerbation of CHF (congestive heart failure) (HCC) 11/23/2018   Acute on chronic heart failure (HCC) 10/19/2018   Pulmonary emphysema (HCC)    Chronic respiratory failure with hypoxia (HCC)    Morbid obesity (HCC) 09/09/2018   URI (upper respiratory infection) 08/31/2018   UTI (urinary tract infection) 08/31/2018   Chronic systolic (congestive) heart failure (HCC) 08/28/2018   CHF (congestive heart failure) (HCC) 08/14/2018   Acute on chronic combined systolic and diastolic CHF (congestive heart failure) (HCC) 07/18/2018   First degree AV block    Shortness of breath 04/29/2018   Acute HFrEF (heart failure with reduced ejection fraction) (HCC) 04/29/2018   Acute respiratory failure with hypoxia (HCC) 09/27/2017   CHF exacerbation (HCC) 09/27/2017   Renal insufficiency    Acute on chronic systolic CHF (congestive heart failure) (HCC) 07/08/2017   Left knee pain 07/08/2017   Acute bronchitis 05/10/2017   Acute congestive heart failure (HCC) 04/14/2017   Elevated troponin 04/14/2017   Lactic acidosis    Acute on chronic systolic heart failure, NYHA class 2 (HCC) 02/03/2017   Diabetes mellitus type 2 in obese  (HCC) 02/03/2017   CAP (community acquired pneumonia) 02/03/2017   Lobar pneumonia (HCC)    Diabetes mellitus with complication (HCC)    Acute on chronic respiratory failure with hypoxia (HCC)    Acute on chronic combined systolic and diastolic heart failure (HCC) 05/19/2016   Depression 05/19/2016   Gout 05/19/2016   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 , Disp: , Rfl:    amiodarone (PACERONE) 200 MG tablet, Take 1 tablet (200 mg total) by mouth daily., Disp: 90 tablet, Rfl: 3   apixaban (ELIQUIS) 2.5 MG TABS tablet, Take 1 tablet (2.5 mg total) by mouth 2 (two) times daily., Disp: , Rfl:    cyanocobalamin 1000 MCG tablet, Take 1 tablet (1,000 mcg total) by mouth daily., Disp: 90 tablet, Rfl: 0   diclofenac Sodium (VOLTAREN) 1 % GEL, Apply 4 g topically 4 (four) times daily., Disp: 50 g, Rfl: 0   finasteride (PROSCAR) 5 MG tablet, Take 1 tablet (5 mg total) by mouth daily., Disp: 30 tablet, Rfl: 0   hydrALAZINE (APRESOLINE)  25 MG tablet, Take 0.5 tablets (12.5 mg total) by mouth every 8 (eight) hours., Disp: 90 tablet, Rfl: 0   ipratropium-albuterol (DUONEB) 0.5-2.5 (3) MG/3ML SOLN, Take 3 mLs by nebulization every 4 (four) hours as needed., Disp: 360 mL, Rfl: 0   isosorbide dinitrate (ISORDIL) 10 MG tablet, Take 10 mg by mouth 3 (three) times daily., Disp: , Rfl:    levothyroxine (SYNTHROID) 125 MCG tablet, Take 1  tablet (125 mcg total) by mouth daily before breakfast., Disp: 90 tablet, Rfl: 1   magnesium oxide (MAG-OX) 400 MG tablet, Take 400 mg by mouth daily. , Disp: , Rfl:    polyethylene glycol (MIRALAX / GLYCOLAX) 17 g packet, Take 17 g by mouth 2 (two) times daily., Disp: 14 each, Rfl: 0   rosuvastatin (CRESTOR) 10 MG tablet, Take 1 tablet (10 mg total) by mouth daily at 6 PM., Disp: 30 tablet, Rfl: 5   senna-docusate (SENOKOT-S) 8.6-50 MG tablet, Take 1 tablet by mouth 2 (two) times daily., Disp: 30 tablet, Rfl: 0   sertraline (ZOLOFT) 25 MG tablet, Take 25 mg by mouth daily. , Disp: , Rfl:    spironolactone (ALDACTONE) 25 MG tablet, Take 12.5 mg by mouth at bedtime., Disp: , Rfl:    tamsulosin (FLOMAX) 0.4 MG CAPS capsule, Take 0.4 mg by mouth at bedtime., Disp: , Rfl:    torsemide (DEMADEX) 20 MG tablet, Take 80 mg in the AM (4 tablets) and then 40 mg in the Evening (2 Tablets) Daily (Patient taking differently: Per dr Tereasa Coop on 9/2 to 80mg  in AM and additional 80mg  in PM if needed for weight gain), Disp: 180 tablet, Rfl: 0   bisacodyl (DULCOLAX) 10 MG suppository, Place 1 suppository (10 mg total) rectally daily as needed for moderate constipation. (Patient not taking: Reported on 05/12/2020), Disp: 12 suppository, Rfl: 0   glipiZIDE (GLUCOTROL) 5 MG tablet, Take 5 mg by mouth daily before breakfast. (Patient not taking: Reported on 07/31/2020), Disp: , Rfl:    HYDROcodone-acetaminophen (NORCO/VICODIN) 5-325 MG tablet, Take 2 tablets by mouth every 4 (four) hours as needed. (Patient not taking: Reported on 05/28/2020), Disp: 6 tablet, Rfl: 0   isosorbide mononitrate (IMDUR) 30 MG 24 hr tablet, Take 1 tablet (30 mg total) by mouth daily. (Patient not taking: Reported on 07/31/2020), Disp: 30 tablet, Rfl: 0   methocarbamol (ROBAXIN) 500 MG tablet, Take 1 tablet (500 mg total) by mouth every 6 (six) hours as needed for muscle spasms. (Patient not taking: Reported on 05/28/2020), Disp: 30 tablet,  Rfl: 0   pantoprazole (PROTONIX) 40 MG tablet, Take 1 tablet (40 mg total) by mouth daily. (Patient not taking: Reported on 03/31/2020), Disp: 30 tablet, Rfl: 11 Allergies  Allergen Reactions   Lisinopril Swelling    Facial swelling   Xarelto [Rivaroxaban] Other (See Comments)    "Blood came out of my penis"      Social History   Socioeconomic History   Marital status: Married    Spouse name: Not on file   Number of children: Not on file   Years of education: Not on file   Highest education level: Not on file  Occupational History   Occupation: Retired    Comment: Former LPN  Tobacco Use   Smoking status: Former Smoker    Packs/day: 1.00    Years: 15.00    Pack years: 15.00    Types: Cigarettes, Cigars   Smokeless tobacco: Former Neurosurgeon   Tobacco comment: quit smoking ~ 50 yr ago  Vaping Use   Vaping Use: Never used  Substance and Sexual Activity   Alcohol use: No    Alcohol/week: 2.0 standard drinks    Types: 2 Cans of beer per week    Comment: rare beer.   Drug use: No   Sexual activity: Yes  Other Topics Concern   Not on file  Social History Narrative   Lives in [place] with wife. Moved from [place] (2013). Does not routinely exercise.   Social Determinants of Health   Financial Resource Strain:    Difficulty of Paying Living Expenses: Not on file  Food Insecurity:    Worried About Programme researcher, broadcasting/film/video in the Last Year: Not on file   [employer] of Food in the Last Year: Not on file  Transportation Needs:    Lack of Transportation (Medical): Not on file   Lack of Transportation (Non-Medical): Not on file  Physical Activity:    Days of Exercise per Week: Not on file   Minutes of Exercise per Session: Not on file  Stress:    Feeling of Stress : Not on file  Social Connections:    Frequency of Communication with Friends and Family: Not on file   Frequency of Social Gatherings with Friends and Family: Not on file   Attends Religious  Services: Not on file   Active Member of Clubs or Organizations: Not on file   Attends Banker Meetings: Not on file   Marital Status: Not on file  Intimate Partner Violence:    Fear of Current or Ex-Partner: Not on file   Emotionally Abused: Not on file   Physically Abused: Not on file   Sexually Abused: Not on file    Physical Exam      Future Appointments  Date Time Provider Department Center  08/08/2020 10:20 AM Laurey Morale, MD [facility] None  08/20/2020 10:45 AM Felecia Shelling, DPM [facility]  09/19/2020  7:35 AM [facility] DEVICE REMOTES [facility]  12/19/2020  7:35 AM [facility] DEVICE REMOTES [facility]  03/20/2021  7:35 AM [facility] DEVICE REMOTES [facility]  06/19/2021  7:35 AM [facility] DEVICE REMOTES [facility]  09/18/2021  7:35 AM [facility] DEVICE REMOTES [facility]    BP 112/60    Pulse 80    Temp 98.3 F (36.8 C)    Resp 18    Wt 189 lb (85.7 kg)    SpO2 94%    BMI 29.60 kg/m   Weight yesterday-in hosp Last visit weight-216  Pt home from being admitted in [place] [facility] since last Wednesday.  There have been quite a few med changes--  Stopped meds: glipizide, isosorbide mononitrate  New meds: isosorbide dinitrate, amio,(they just filled-he has been taking) tamsulosin (refilled also)   Pt reports he is feeling ok, feels tired. I did ask wife to ask nurse if they offered telemonitoring scales and if so can he get some that way he is more accountable for his weights. Either he wasn't actually weighing every day or he couldn't see the numbers correctly or forgot and just wrote down what he thought.  He finally agreed to allow PT/OT to come in and do their job.  [facility] also had down for him to take potassium but he was not given that med when he left like he did the other changes.  He goes to heart clinic next Friday. Will let providers know of the potassium on  list as well.  I did let nurse  know of his isosorbide change.  Pill box done according to his d/c papers.  Will see next week.   [name], EMT-Paramedic [phone] [facility] Paramedic  07/31/20

## 2020-08-07 ENCOUNTER — Other Ambulatory Visit (HOSPITAL_COMMUNITY): Payer: Self-pay

## 2020-08-07 NOTE — Progress Notes (Signed)
Paramedicine Encounter    Patient ID: Ryan Macdonald, male    DOB: Feb 23, 1934, 86 y.o.   MRN: [id number]   Patient Care Team: [facility], [facility] - General (General Practice) [name], MD as PCP - Cardiology (Cardiology) Laurey Morale, MD as PCP - Advanced Heart Failure (Cardiology)  Patient Active Problem List   Diagnosis Date Noted  . Hematuria 02/15/2020  . CKD (chronic kidney disease), stage III 02/15/2020  . Constipation 02/13/2020  . Anemia 02/13/2020  . Secondary hypercoagulable state (HCC) 01/16/2020  . AKI (acute kidney injury) (HCC) 03/14/2019  . Spasm of muscle of lower back 03/14/2019  . Acute renal failure (ARF) (HCC) 03/09/2019  . Transaminitis   . Acute on chronic renal failure (HCC) 03/08/2019  . Acute exacerbation of CHF (congestive heart failure) (HCC) 11/23/2018  . Acute on chronic heart failure (HCC) 10/19/2018  . Pulmonary emphysema (HCC)   . Chronic respiratory failure with hypoxia (HCC)   . Morbid obesity (HCC) 09/09/2018  . URI (upper respiratory infection) 08/31/2018  . UTI (urinary tract infection) 08/31/2018  . Chronic systolic (congestive) heart failure (HCC) 08/28/2018  . CHF (congestive heart failure) (HCC) 08/14/2018  . Acute on chronic combined systolic and diastolic CHF (congestive heart failure) (HCC) 07/18/2018  . First degree AV block   . Shortness of breath 04/29/2018  . Acute HFrEF (heart failure with reduced ejection fraction) (HCC) 04/29/2018  . Acute respiratory failure with hypoxia (HCC) 09/27/2017  . CHF exacerbation (HCC) 09/27/2017  . Renal insufficiency   . Acute on chronic systolic CHF (congestive heart failure) (HCC) 07/08/2017  . Left knee pain 07/08/2017  . Acute bronchitis 05/10/2017  . Acute congestive heart failure (HCC) 04/14/2017  . Elevated troponin 04/14/2017  . Lactic acidosis   . Acute on chronic systolic heart failure, NYHA class 2 (HCC) 02/03/2017  . Diabetes mellitus type 2 in obese  (HCC) 02/03/2017  . CAP (community acquired pneumonia) 02/03/2017  . Lobar pneumonia (HCC)   . Diabetes mellitus with complication (HCC)   . Acute on chronic respiratory failure with hypoxia (HCC)   . Acute on chronic combined systolic and diastolic heart failure (HCC) 05/19/2016  . Depression 05/19/2016  . Gout 05/19/2016  . 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 NSVT (nonsustained ventricular tachycardia) (HCC)   . Chronic renal insufficiency, stage 3 (moderate) (HCC)   . Hyperkalemia 08/21/2015  . Pulmonary embolism (HCC)   . Acute respiratory failure (HCC)   . Osteoarthritis of left hip 10/25/2013  . Atrial fibrillation (HCC) 10/25/2013  . S/P hip replacement 10/24/2013  . Nonischemic cardiomyopathy (HCC)   . Obesity (BMI 30-39.9)   . Essential hypertension   . Hyperlipidemia     Current Outpatient Medications:  .  allopurinol (ZYLOPRIM) 100 MG tablet, Take 200 mg by mouth daily. , Disp: , Rfl:  .  amiodarone (PACERONE) 200 MG tablet, Take 1 tablet (200 mg total) by mouth daily., Disp: 90 tablet, Rfl: 3 .  apixaban (ELIQUIS) 2.5 MG TABS tablet, Take 1 tablet (2.5 mg total) by mouth 2 (two) times daily., Disp: , Rfl:  .  cyanocobalamin 1000 MCG tablet, Take 1 tablet (1,000 mcg total) by mouth daily., Disp: 90 tablet, Rfl: 0 .  diclofenac Sodium (VOLTAREN) 1 % GEL, Apply 4 g topically 4 (four) times daily., Disp: 50 g, Rfl: 0 .  finasteride (PROSCAR) 5 MG tablet, Take 1 tablet (5 mg total) by mouth daily., Disp: 30 tablet, Rfl: 0 .  hydrALAZINE (APRESOLINE)  25 MG tablet, Take 0.5 tablets (12.5 mg total) by mouth every 8 (eight) hours., Disp: 90 tablet, Rfl: 0 .  ipratropium-albuterol (DUONEB) 0.5-2.5 (3) MG/3ML SOLN, Take 3 mLs by nebulization every 4 (four) hours as needed., Disp: 360 mL, Rfl: 0 .  isosorbide dinitrate (ISORDIL) 10 MG tablet, Take 10 mg by mouth 3 (three) times daily., Disp: , Rfl:  .  levothyroxine (SYNTHROID) 125 MCG tablet, Take 1  tablet (125 mcg total) by mouth daily before breakfast., Disp: 90 tablet, Rfl: 1 .  magnesium oxide (MAG-OX) 400 MG tablet, Take 400 mg by mouth daily. , Disp: , Rfl:  .  polyethylene glycol (MIRALAX / GLYCOLAX) 17 g packet, Take 17 g by mouth 2 (two) times daily., Disp: 14 each, Rfl: 0 .  rosuvastatin (CRESTOR) 10 MG tablet, Take 1 tablet (10 mg total) by mouth daily at 6 PM., Disp: 30 tablet, Rfl: 5 .  senna-docusate (SENOKOT-S) 8.6-50 MG tablet, Take 1 tablet by mouth 2 (two) times daily., Disp: 30 tablet, Rfl: 0 .  sertraline (ZOLOFT) 25 MG tablet, Take 25 mg by mouth daily. , Disp: , Rfl:  .  spironolactone (ALDACTONE) 25 MG tablet, Take 12.5 mg by mouth at bedtime., Disp: , Rfl:  .  tamsulosin (FLOMAX) 0.4 MG CAPS capsule, Take 0.4 mg by mouth at bedtime., Disp: , Rfl:  .  torsemide (DEMADEX) 20 MG tablet, Take 80 mg in the AM (4 tablets) and then 40 mg in the Evening (2 Tablets) Daily (Patient taking differently: Per dr Tereasa Coop on 9/2 to 80mg  in AM and additional 80mg  in PM if needed for weight gain), Disp: 180 tablet, Rfl: 0 .  bisacodyl (DULCOLAX) 10 MG suppository, Place 1 suppository (10 mg total) rectally daily as needed for moderate constipation. (Patient not taking: Reported on 05/12/2020), Disp: 12 suppository, Rfl: 0 .  glipiZIDE (GLUCOTROL) 5 MG tablet, Take 5 mg by mouth daily before breakfast. (Patient not taking: Reported on 07/31/2020), Disp: , Rfl:  .  HYDROcodone-acetaminophen (NORCO/VICODIN) 5-325 MG tablet, Take 2 tablets by mouth every 4 (four) hours as needed. (Patient not taking: Reported on 05/28/2020), Disp: 6 tablet, Rfl: 0 .  isosorbide mononitrate (IMDUR) 30 MG 24 hr tablet, Take 1 tablet (30 mg total) by mouth daily. (Patient not taking: Reported on 07/31/2020), Disp: 30 tablet, Rfl: 0 .  methocarbamol (ROBAXIN) 500 MG tablet, Take 1 tablet (500 mg total) by mouth every 6 (six) hours as needed for muscle spasms. (Patient not taking: Reported on 05/28/2020), Disp: 30 tablet,  Rfl: 0 .  pantoprazole (PROTONIX) 40 MG tablet, Take 1 tablet (40 mg total) by mouth daily. (Patient not taking: Reported on 03/31/2020), Disp: 30 tablet, Rfl: 11 Allergies  Allergen Reactions  . Lisinopril Swelling    Facial swelling  . Xarelto [Rivaroxaban] Other (See Comments)    "Blood came out of my penis"      Social History   Socioeconomic History  . Marital status: Married    Spouse name: Not on file  . Number of children: Not on file  . Years of education: Not on file  . Highest education level: Not on file  Occupational History  . Occupation: Retired    Comment: Former 05/30/2020  Tobacco Use  . Smoking status: Former Smoker    Packs/day: 1.00    Years: 15.00    Pack years: 15.00    Types: Cigarettes, Cigars  . Smokeless tobacco: Former 05/31/2020  . Tobacco comment: quit smoking ~ 50 yr ago  Vaping Use  . Vaping Use: Never used  Substance and Sexual Activity  . Alcohol use: No    Alcohol/week: 2.0 standard drinks    Types: 2 Cans of beer per week    Comment: rare beer.  . Drug use: No  . Sexual activity: Yes  Other Topics Concern  . Not on file  Social History Narrative   Lives in [place] with wife. Moved from [place] (2013). Does not routinely exercise.   Social Determinants of Health   Financial Resource Strain:   . Difficulty of Paying Living Expenses: Not on file  Food Insecurity:   . Worried About Programme researcher, broadcasting/film/video in the Last Year: Not on file  . Ran Out of Food in the Last Year: Not on file  Transportation Needs:   . Lack of Transportation (Medical): Not on file  . Lack of Transportation (Non-Medical): Not on file  Physical Activity:   . Days of Exercise per Week: Not on file  . Minutes of Exercise per Session: Not on file  Stress:   . Feeling of Stress : Not on file  Social Connections:   . Frequency of Communication with Friends and Family: Not on file  . Frequency of Social Gatherings with Friends and Family: Not on file  . Attends Religious  Services: Not on file  . Active Member of Clubs or Organizations: Not on file  . Attends Banker Meetings: Not on file  . Marital Status: Not on file  Intimate Partner Violence:   . Fear of Current or Ex-Partner: Not on file  . Emotionally Abused: Not on file  . Physically Abused: Not on file  . Sexually Abused: Not on file    Physical Exam      Future Appointments  Date Time Provider Department Center  08/08/2020 10:20 AM Laurey Morale, MD [facility] None  08/20/2020 10:45 AM Felecia Shelling, DPM [facility]  09/19/2020  7:35 AM [facility] DEVICE REMOTES [facility]  12/19/2020  7:35 AM [facility] DEVICE REMOTES [facility]  03/20/2021  7:35 AM [facility] DEVICE REMOTES [facility]  06/19/2021  7:35 AM [facility] DEVICE REMOTES [facility]  09/18/2021  7:35 AM [facility]ICE [facility]OT[facility]    BP 116/60   Pulse 96   Temp 98 F (36.7 C)   Resp 18   Wt 198 lb (89.8 kg)   SpO2 96%   BMI 31.01 kg/m   Weight yesterday-? Last visit weight-189  Pt reports PT has been coming now and he is becoming more active and confidant walking around. She helped him get down 2 of  the stairs as well.  Pt denies increased sob. No dizziness. No c/p. He does have edema to his legs again up to his knees.  According to weight from last week at home he is up 9 lbs. Not sure when he noticed that increase, he told him his weight today was 180-so not sure if he is not seeing numbers right or if the scales are not correct. [facility] that are telemonitored.  With his new instructions of torsemide he will be taking the 4 extra this afternoon.  He does not have on his compression stockings today. The PT nurse has measured him for new ones that zip up.   Wife is not here right now to tell me where she has been writing down his weights on.  meds verified and pill box refilled.  He is going to reorder allopurinol.  Lungs clear. V/s as noted. HR is elevated but he was on the way to bathroom and he would be in there for a while so I had to grab those v/s while he was standing.    [name], EMT-Paramedic [phone] [facility] Paramedic  08/07/20

## 2020-08-08 ENCOUNTER — Other Ambulatory Visit (HOSPITAL_COMMUNITY): Payer: Self-pay | Admitting: Cardiology

## 2020-08-08 ENCOUNTER — Other Ambulatory Visit: Payer: Self-pay

## 2020-08-08 ENCOUNTER — Ambulatory Visit (HOSPITAL_COMMUNITY)
Admission: RE | Admit: 2020-08-08 | Discharge: 2020-08-08 | Disposition: A | Discharge status: Home / Self Care | Payer: No Typology Code available for payment source | Source: Ambulatory Visit | Attending: Cardiology | Admitting: Cardiology

## 2020-08-08 VITALS — BP 108/58 | HR 86 | Ht 67.0 in | Wt 203.2 lb

## 2020-08-08 DIAGNOSIS — J9611 Chronic respiratory failure with hypoxia: Secondary | ICD-10-CM | POA: Diagnosis not present

## 2020-08-08 DIAGNOSIS — I5022 Chronic systolic (congestive) heart failure: Secondary | ICD-10-CM | POA: Insufficient documentation

## 2020-08-08 DIAGNOSIS — Z79899 Other long term (current) drug therapy: Secondary | ICD-10-CM | POA: Diagnosis not present

## 2020-08-08 DIAGNOSIS — I428 Other cardiomyopathies: Secondary | ICD-10-CM | POA: Insufficient documentation

## 2020-08-08 DIAGNOSIS — I440 Atrioventricular block, first degree: Secondary | ICD-10-CM | POA: Insufficient documentation

## 2020-08-08 DIAGNOSIS — E785 Hyperlipidemia, unspecified: Secondary | ICD-10-CM | POA: Insufficient documentation

## 2020-08-08 DIAGNOSIS — I480 Paroxysmal atrial fibrillation: Secondary | ICD-10-CM | POA: Insufficient documentation

## 2020-08-08 DIAGNOSIS — I2510 Atherosclerotic heart disease of native coronary artery without angina pectoris: Secondary | ICD-10-CM | POA: Insufficient documentation

## 2020-08-08 DIAGNOSIS — N183 Chronic kidney disease, stage 3 unspecified: Secondary | ICD-10-CM | POA: Insufficient documentation

## 2020-08-08 DIAGNOSIS — M109 Gout, unspecified: Secondary | ICD-10-CM | POA: Diagnosis not present

## 2020-08-08 DIAGNOSIS — Z87891 Personal history of nicotine dependence: Secondary | ICD-10-CM | POA: Insufficient documentation

## 2020-08-08 DIAGNOSIS — Z955 Presence of coronary angioplasty implant and graft: Secondary | ICD-10-CM | POA: Insufficient documentation

## 2020-08-08 DIAGNOSIS — Z9981 Dependence on supplemental oxygen: Secondary | ICD-10-CM | POA: Diagnosis not present

## 2020-08-08 DIAGNOSIS — I130 Hypertensive heart and chronic kidney disease with heart failure and stage 1 through stage 4 chronic kidney disease, or unspecified chronic kidney disease: Secondary | ICD-10-CM | POA: Insufficient documentation

## 2020-08-08 DIAGNOSIS — Z79890 Hormone replacement therapy (postmenopausal): Secondary | ICD-10-CM | POA: Insufficient documentation

## 2020-08-08 DIAGNOSIS — R0602 Shortness of breath: Secondary | ICD-10-CM

## 2020-08-08 DIAGNOSIS — Z7901 Long term (current) use of anticoagulants: Secondary | ICD-10-CM | POA: Insufficient documentation

## 2020-08-08 DIAGNOSIS — E039 Hypothyroidism, unspecified: Secondary | ICD-10-CM | POA: Diagnosis not present

## 2020-08-08 DIAGNOSIS — E1122 Type 2 diabetes mellitus with diabetic chronic kidney disease: Secondary | ICD-10-CM | POA: Insufficient documentation

## 2020-08-08 LAB — CBC
HCT: 29.3 % — ABNORMAL LOW (ref 39.0–52.0)
Hemoglobin: 8.7 g/dL — ABNORMAL LOW (ref 13.0–17.0)
MCH: 26.2 pg (ref 26.0–34.0)
MCHC: 29.7 g/dL — ABNORMAL LOW (ref 30.0–36.0)
MCV: 88.3 fL (ref 80.0–100.0)
Platelets: 258 10*3/uL (ref 150–400)
RBC: 3.32 MIL/uL — ABNORMAL LOW (ref 4.22–5.81)
RDW: 25.2 % — ABNORMAL HIGH (ref 11.5–15.5)
WBC: 7.6 10*3/uL (ref 4.0–10.5)
nRBC: 0 % (ref 0.0–0.2)

## 2020-08-08 LAB — COMPREHENSIVE METABOLIC PANEL
ALT: 30 U/L (ref 0–44)
AST: 64 U/L — ABNORMAL HIGH (ref 15–41)
Albumin: 2.3 g/dL — ABNORMAL LOW (ref 3.5–5.0)
Alkaline Phosphatase: 93 U/L (ref 38–126)
Anion gap: 13 (ref 5–15)
BUN: 41 mg/dL — ABNORMAL HIGH (ref 8–23)
CO2: 25 mmol/L (ref 22–32)
Calcium: 8.8 mg/dL — ABNORMAL LOW (ref 8.9–10.3)
Chloride: 97 mmol/L — ABNORMAL LOW (ref 98–111)
Creatinine, Ser: 1.99 mg/dL — ABNORMAL HIGH (ref 0.61–1.24)
GFR calc Af Amer: 35 mL/min — ABNORMAL LOW (ref >60)
GFR calc non Af Amer: 30 mL/min — ABNORMAL LOW (ref >60)
Glucose, Bld: 127 mg/dL — ABNORMAL HIGH (ref 70–99)
Potassium: 4 mmol/L (ref 3.5–5.1)
Sodium: 135 mmol/L (ref 135–145)
Total Bilirubin: 1.4 mg/dL — ABNORMAL HIGH (ref 0.3–1.2)
Total Protein: 8 g/dL (ref 6.5–8.1)

## 2020-08-08 LAB — TSH: TSH: 2.705 u[IU]/mL (ref 0.350–4.500)

## 2020-08-08 MED ORDER — METOLAZONE 2.5 MG PO TABS: 2.5000 mg | ORAL_TABLET | ORAL | 2 refills | Status: DC

## 2020-08-08 MED ORDER — TORSEMIDE 20 MG PO TABS: ORAL_TABLET | ORAL | 11 refills | Status: DC

## 2020-08-08 MED ORDER — POTASSIUM CHLORIDE CRYS ER 20 MEQ PO TBCR: 20.0000 meq | EXTENDED_RELEASE_TABLET | ORAL | 0 refills | Status: DC

## 2020-08-08 NOTE — Progress Notes (Signed)
Order for BLE Ace wraps/compression wraps and repeat labs in 7-10 days faxed to [facility] at [phone]

## 2020-08-08 NOTE — Patient Instructions (Signed)
INCREASE Torsemide to 80 mg twice a for 2 days, then resume 80 mg in the AM and 40 mg in the PM  START Metolazone 2.5 mg, take one tab daily 30 minutes prior to morning torsemide Saturday and Sunday. Then every week on Wednesday only  START Potassium 20 meq, one tab weekly with Metolazone  A chest x-ray takes a picture of the organs and structures inside the chest, including the heart, lungs, and blood vessels. This test can show several things, including, whether the heart is enlarges; whether fluid is building up in the lungs; and whether pacemaker / defibrillator leads are still in place.  You have been referred to have leg wraps to help with leg swelling. -please call triage to give home health information so we can send order.  Your physician recommends that you schedule a follow-up appointment in: 1 week  in the Advanced Practitioners (PA/NP) Clinic   If you have any questions or concerns before your next appointment please send [place] a message through [place] or call our office at [phone].    TO LEAVE A MESSAGE FOR THE NURSE SELECT OPTION 2, PLEASE LEAVE A MESSAGE INCLUDING: . YOUR NAME . DATE OF BIRTH . CALL BACK NUMBER . REASON FOR CALL**this is important as we prioritize the call backs  YOU WILL RECEIVE A CALL BACK THE SAME DAY AS LONG AS YOU CALL BEFORE 4:00 PM

## 2020-08-09 NOTE — Progress Notes (Signed)
Date:  08/09/2020   ID:  Ryan Macdonald, DOB Jan 30, 1934, MRN [id number]   Provider location: [facility] Type of Visit: Established patient   PCP:  [facility]; Dr. Tereasa Coop  Cardiologist:  Dr. Shirlee Latch   History of Present Illness: Ryan Macdonald is a 86 y.o. male who has a history ofsystolic HF due toNICM, Biotronic ICD, A Fibon Eliquis, DMII, HTN,CAD s/p pLAD stent in 07/2018 with residual non-obstructive disease,chronic hypoxic respiratory failure on home O2 PRN,and depression.  Admitted 12/26 - 11/28/18 with A/C systolic CHF. Diuresed 21 lbs and meds adjusted as tolerated. Echo showed EF 20-25%. EP was consulted for RV pacing and ICD settings were adjusted => patient was reprogrammed to VDD, now with narrow QRS with pacing.   Patient was admitted in 3/21 with CHF, AKI and E coli UTI.  He had a prolonged admission, was diuresed on milrinone gtt + Lasix gtt.  Amiodarone was restarted to keep him in NSR.   Echo in 4/21 showed EF 25% with moderate LV dilation and inferior/inferolateral AK; mildly decreased RV systolic function; moderate MR.   Patient returns for followup of CHF.  He was recently in the hospital in the [place] for CHF exacerbation.  Since discharge, weight is up about 10 lbs.  He thinks he is taking torsemide 80 mg bid but is a bit unsure. Legs swelling is worsening.  No dyspnea walking around the house but not very active.  +Orthopnea, sleeps on his side. No chest pain.  No lightheadedness.    Labs (4/20): K 4.1, creatinine 2.23, LDL 58 Labs (6/20): TSH 43, K 4.6, creatinine 1.67 Labs (8/20): K 5.1, creatinine 1.66 Labs (10/20): K 4.9 => 5, creatinine 1.89 => 1.5, LDL 65, HDL 32, hgb 10.4 Labs (3/21): K 3.7, creatinine 2 Labs (4/21): K 4.2, creatinine 1.72, TSH normal  PMH: 1. Atrial fibrillation: Paroxysmal. 2. CKD: Stage 3. 3. Type 2 diabetes 4. Hyperlipidemia 5. HTN 6. Nephrolithiasis 7. ?NAFLD 8. Gout  9. CAD: LHC  (9/19) with 40% distal LM, 50% ostial LAD, 50% mid LAD, 75% proximal LAD treated with DES.  10. Chronic hypoxemic respiratory failure. Has home oxygen. ?COPD: Prior smoker.  11. CHF: EF low since 2014. Primarily nonischemic cardiomyopathy. Biotronik ICD. - Echo (12/19) with EF 20-25%, diffuse hypokinesis, moderate MR, mildly dilated RV with moderately decreased systolic function, PASP 39 mmHg.  - ACEI angioedema.  - Echo (4/21): EF 25% with moderate LV dilation and inferior/inferolateral AK; mildly decreased RV systolic function; moderate MR.  12. Hypothyroidism 13. Fe deficiency anemia.    Current Outpatient Medications  Medication Sig Dispense Refill  . allopurinol (ZYLOPRIM) 100 MG tablet Take 200 mg by mouth daily.     Marland Kitchen amiodarone (PACERONE) 200 MG tablet Take 1 tablet (200 mg total) by mouth daily. 90 tablet 3  . apixaban (ELIQUIS) 2.5 MG TABS tablet Take 1 tablet (2.5 mg total) by mouth 2 (two) times daily.    . cyanocobalamin 1000 MCG tablet Take 1 tablet (1,000 mcg total) by mouth daily. 90 tablet 0  . diclofenac Sodium (VOLTAREN) 1 % GEL Apply 4 g topically 4 (four) times daily. 50 g 0  . finasteride (PROSCAR) 5 MG tablet Take 1 tablet (5 mg total) by mouth daily. 30 tablet 0  . hydrALAZINE (APRESOLINE) 25 MG tablet Take 0.5 tablets (12.5 mg total) by mouth every 8 (eight) hours. 90 tablet 0  . ipratropium-albuterol (DUONEB) 0.5-2.5 (3) MG/3ML SOLN Take 3 mLs by  nebulization every 4 (four) hours as needed. 360 mL 0  . isosorbide dinitrate (ISORDIL) 10 MG tablet Take 10 mg by mouth 3 (three) times daily.    Marland Kitchen levothyroxine (SYNTHROID) 125 MCG tablet Take 1 tablet (125 mcg total) by mouth daily before breakfast. 90 tablet 1  . magnesium oxide (MAG-OX) 400 MG tablet Take 400 mg by mouth daily.     . polyethylene glycol (MIRALAX / GLYCOLAX) 17 g packet Take 17 g by mouth 2 (two) times daily. 14 each 0  . rosuvastatin (CRESTOR) 10 MG tablet Take 1 tablet (10 mg total) by mouth daily at 6  PM. 30 tablet 5  . senna-docusate (SENOKOT-S) 8.6-50 MG tablet Take 1 tablet by mouth 2 (two) times daily. 30 tablet 0  . sertraline (ZOLOFT) 25 MG tablet Take 25 mg by mouth daily.     Marland Kitchen spironolactone (ALDACTONE) 25 MG tablet Take 12.5 mg by mouth at bedtime.    . tamsulosin (FLOMAX) 0.4 MG CAPS capsule Take 0.4 mg by mouth at bedtime.    . torsemide (DEMADEX) 20 MG tablet Take 4 tablets (80 mg total) by mouth in the morning AND 2 tablets (40 mg total) every evening. 180 tablet 11  . metolazone (ZAROXOLYN) 2.5 MG tablet Take 1 tablet (2.5 mg total) by mouth once a week. On wednesdays 8 tablet 2  . potassium chloride SA (KLOR-CON) 20 MEQ tablet Take 1 tablet (20 mEq total) by mouth once a week. Only on metolazone days 90 tablet 0   No current facility-administered medications for this encounter.    Allergies:   Lisinopril and Xarelto [rivaroxaban]   Social History:  The patient  reports that he has quit smoking. His smoking use included cigarettes and cigars. He has a 15.00 pack-year smoking history. He has quit using smokeless tobacco. He reports that he does not drink alcohol and does not use drugs.   Family History:  The patient's family history includes Heart Problems in his maternal grandmother; Heart disease in his mother; Other in an other family member.   ROS:  Please see the history of present illness.   All other systems are personally reviewed and negative.   Exam:  BP (!) 108/58   Pulse 86   Ht 5\' 7"  (1.702 m)   Wt 92.2 kg (203 lb 3.2 oz)   SpO2 97%   BMI 31.83 kg/m  General: NAD Neck: JVP 10 cm, no thyromegaly or thyroid nodule.  Lungs: Decreased BS right base.  CV: Nondisplaced PMI.  Heart regular S1/S2, no S3/S4, no murmur.  2+ edema to knees.  No carotid bruit.  Difficult to palpate pedal pulses.  Abdomen: Soft, nontender, no hepatosplenomegaly, no distention.  Skin: Intact without lesions or rashes.  Neurologic: Alert and oriented x 3.  Psych: Normal  affect. Extremities: No clubbing or cyanosis.  HEENT: Normal.   Recent Labs: 02/11/2020: B Natriuretic Peptide 1,569.0 02/24/2020: Magnesium 1.7 08/08/2020: ALT 30; BUN 41; Creatinine, Ser 1.99; Hemoglobin 8.7; Platelets 258; Potassium 4.0; Sodium 135; TSH 2.705  Personally reviewed   Wt Readings from Last 3 Encounters:  08/08/20 92.2 kg (203 lb 3.2 oz)  08/07/20 89.8 kg (198 lb)  07/31/20 85.7 kg (189 lb)    ASSESSMENT AND PLAN:  1. Chronic systolic CHF: Primarily nonischemic cardiomyopathy. EF has been low since at least 2014.  He has single chamber Biotronik ICD. QRS not wide enough for CRT.Settings adjusted in the past to prevent RV pacing in the face of a long 1st  degree AV block.  Echo in 4/21 showed EF 25% with moderate LV dilation and inferior/inferolateral AK; mildly decreased RV systolic function; moderate MR. NYHA class III, weight up 10 lbs since recent discharge from hospitalization at the [facility] for CHF exacerbation. He is volume overloaded on exam.  NYHA class II symptoms.  Weight is up but he does not appear significantly volume overloaded.  - CXR today to assess for pleural effusion based on exam.       - Continue torsemide 80 mg bid.  I will have him take metolazone 2.5 mg on Saturday am, again on Sunday am, then on Wednesday am.  He will then take metolazone once weekly on Wednesdays.  BMET today and next week.      - Continue spironolactone 12.5 mg qhs.   - Continue hydralazine 12.5 mg tid and Isordil 10 mg tid.  No BP room for titration.   -Have been holding off on beta blocker given long 1st degree AVB to limit RV pacing.  -No ACEI or ARNI with angioedema on lisinopril. - Would hold off on ARB with CKD stage 3 and h/o AKI.  - Will try to arrange for ACE wraps for his legs.  2. CAD: S/p PCI 07/2018 to pLAD but CAD does not explain his cardiomyopathy.  No chest pain.  - He is off Plavix now, on Eliquis for his atrial fibrillation.  - Continue Crestor 3.Atrial  fibrillation: Paroxysmal.  He is in NSR today.   - Continue Eliquis 2.5 mg bid (age, creatinine > 1.5).  - Continue amiodarone 200 mg daily, check LFTs and TSH today (he is on Levoxyl). He needs a regular eye exam while on amiodarone.  4. CKD: Stage 3.  BMET today.  5. Hypothyroidism: Suspect this is due to amiodarone.  Levoxyl per PCP.    He will need close followup, see NP/PA next week.   Signed, [name], MD  08/09/2020  [facility] [address] Heart and Vascular [place] [address] [phone] (office) [phone] (fax)

## 2020-08-11 ENCOUNTER — Telehealth (HOSPITAL_COMMUNITY): Payer: Self-pay

## 2020-08-11 MED ORDER — POTASSIUM CHLORIDE CRYS ER 20 MEQ PO TBCR: 20.0000 meq | EXTENDED_RELEASE_TABLET | ORAL | 3 refills | Status: DC

## 2020-08-11 NOTE — Telephone Encounter (Signed)
-----   Message from Laurey Morale, MD sent at 08/08/2020  5:02 PM EDT ----- Small to moderate right effusion and mild pulmonary edema.  Would treat with diuresis for now.

## 2020-08-13 ENCOUNTER — Ambulatory Visit (HOSPITAL_BASED_OUTPATIENT_CLINIC_OR_DEPARTMENT_OTHER)
Admission: RE | Admit: 2020-08-13 | Discharge: 2020-08-13 | Disposition: A | Discharge status: Home / Self Care | Payer: Non-veteran care | Source: Ambulatory Visit | Attending: Cardiology | Admitting: Cardiology

## 2020-08-13 ENCOUNTER — Encounter (HOSPITAL_COMMUNITY): Payer: Self-pay

## 2020-08-13 ENCOUNTER — Other Ambulatory Visit: Payer: Self-pay

## 2020-08-13 VITALS — BP 110/66 | HR 88 | Ht 67.0 in | Wt 195.4 lb

## 2020-08-13 DIAGNOSIS — Z79899 Other long term (current) drug therapy: Secondary | ICD-10-CM | POA: Insufficient documentation

## 2020-08-13 DIAGNOSIS — Z955 Presence of coronary angioplasty implant and graft: Secondary | ICD-10-CM | POA: Insufficient documentation

## 2020-08-13 DIAGNOSIS — J9611 Chronic respiratory failure with hypoxia: Secondary | ICD-10-CM | POA: Insufficient documentation

## 2020-08-13 DIAGNOSIS — N183 Chronic kidney disease, stage 3 unspecified: Secondary | ICD-10-CM | POA: Insufficient documentation

## 2020-08-13 DIAGNOSIS — Z8249 Family history of ischemic heart disease and other diseases of the circulatory system: Secondary | ICD-10-CM | POA: Insufficient documentation

## 2020-08-13 DIAGNOSIS — I480 Paroxysmal atrial fibrillation: Secondary | ICD-10-CM | POA: Insufficient documentation

## 2020-08-13 DIAGNOSIS — I130 Hypertensive heart and chronic kidney disease with heart failure and stage 1 through stage 4 chronic kidney disease, or unspecified chronic kidney disease: Secondary | ICD-10-CM | POA: Insufficient documentation

## 2020-08-13 DIAGNOSIS — Z9981 Dependence on supplemental oxygen: Secondary | ICD-10-CM | POA: Insufficient documentation

## 2020-08-13 DIAGNOSIS — M109 Gout, unspecified: Secondary | ICD-10-CM | POA: Insufficient documentation

## 2020-08-13 DIAGNOSIS — I5022 Chronic systolic (congestive) heart failure: Secondary | ICD-10-CM

## 2020-08-13 DIAGNOSIS — Z87891 Personal history of nicotine dependence: Secondary | ICD-10-CM | POA: Insufficient documentation

## 2020-08-13 DIAGNOSIS — Z95810 Presence of automatic (implantable) cardiac defibrillator: Secondary | ICD-10-CM | POA: Insufficient documentation

## 2020-08-13 DIAGNOSIS — J90 Pleural effusion, not elsewhere classified: Secondary | ICD-10-CM | POA: Insufficient documentation

## 2020-08-13 DIAGNOSIS — E785 Hyperlipidemia, unspecified: Secondary | ICD-10-CM | POA: Insufficient documentation

## 2020-08-13 DIAGNOSIS — I2510 Atherosclerotic heart disease of native coronary artery without angina pectoris: Secondary | ICD-10-CM | POA: Insufficient documentation

## 2020-08-13 DIAGNOSIS — Z888 Allergy status to other drugs, medicaments and biological substances status: Secondary | ICD-10-CM | POA: Insufficient documentation

## 2020-08-13 DIAGNOSIS — I440 Atrioventricular block, first degree: Secondary | ICD-10-CM | POA: Insufficient documentation

## 2020-08-13 DIAGNOSIS — Z7901 Long term (current) use of anticoagulants: Secondary | ICD-10-CM | POA: Insufficient documentation

## 2020-08-13 DIAGNOSIS — E039 Hypothyroidism, unspecified: Secondary | ICD-10-CM | POA: Insufficient documentation

## 2020-08-13 DIAGNOSIS — I428 Other cardiomyopathies: Secondary | ICD-10-CM | POA: Insufficient documentation

## 2020-08-13 DIAGNOSIS — E1122 Type 2 diabetes mellitus with diabetic chronic kidney disease: Secondary | ICD-10-CM | POA: Insufficient documentation

## 2020-08-13 LAB — BASIC METABOLIC PANEL
Anion gap: 13 (ref 5–15)
BUN: 49 mg/dL — ABNORMAL HIGH (ref 8–23)
CO2: 25 mmol/L (ref 22–32)
Calcium: 9.2 mg/dL (ref 8.9–10.3)
Chloride: 98 mmol/L (ref 98–111)
Creatinine, Ser: 2.14 mg/dL — ABNORMAL HIGH (ref 0.61–1.24)
GFR calc Af Amer: 32 mL/min — ABNORMAL LOW (ref >60)
GFR calc non Af Amer: 27 mL/min — ABNORMAL LOW (ref >60)
Glucose, Bld: 115 mg/dL — ABNORMAL HIGH (ref 70–99)
Potassium: 3.6 mmol/L (ref 3.5–5.1)
Sodium: 136 mmol/L (ref 135–145)

## 2020-08-13 MED ORDER — POTASSIUM CHLORIDE CRYS ER 20 MEQ PO TBCR: 20.0000 meq | EXTENDED_RELEASE_TABLET | ORAL | 3 refills | Status: DC

## 2020-08-13 NOTE — Progress Notes (Signed)
  Vest 29% Station D Ruler 35 Sitting

## 2020-08-13 NOTE — Patient Instructions (Addendum)
Be sure to keep your feet elevated through out the day.  Labs today We will only contact you if something comes back abnormal or we need to make some changes. Otherwise no news is good news!   Your physician recommends that you schedule a follow-up appointment in: 4-6 weeks    If you have any questions or concerns before your next appointment please send [place] a message through [place] or call our office at [phone].    TO LEAVE A MESSAGE FOR THE NURSE SELECT OPTION 2, PLEASE LEAVE A MESSAGE INCLUDING: . YOUR NAME . DATE OF BIRTH . CALL BACK NUMBER . REASON FOR CALL**this is important as we prioritize the call backs  YOU WILL RECEIVE A CALL BACK THE SAME DAY AS LONG AS YOU CALL BEFORE 4:00 PM

## 2020-08-13 NOTE — Progress Notes (Signed)
Date:  08/13/2020   ID:  Ryan Macdonald, DOB 04/28/1934, MRN [id number]   Provider location: [facility] Type of Visit: Established patient   PCP:  [facility]; Dr. Tereasa Coop  Cardiologist:  Dr. Shirlee Latch   History of Present Illness: Ryan Macdonald is a 86 y.o. male who has a history ofsystolic HF due toNICM, Biotronic ICD, A Fibon Eliquis, DMII, HTN,CAD s/p pLAD stent in 07/2018 with residual non-obstructive disease,chronic hypoxic respiratory failure on home O2 PRN,and depression.  Admitted 12/26 - 11/28/18 with A/C systolic CHF. Diuresed 21 lbs and meds adjusted as tolerated. Echo showed EF 20-25%. EP was consulted for RV pacing and ICD settings were adjusted => patient was reprogrammed to VDD, now with narrow QRS with pacing.   Patient was admitted in 3/21 with CHF, AKI and E coli UTI.  He had a prolonged admission, was diuresed on milrinone gtt + Lasix gtt.  Amiodarone was restarted to keep him in NSR.   Echo in 4/21 showed EF 25% with moderate LV dilation and inferior/inferolateral AK; mildly decreased RV systolic function; moderate MR.   Patient returns for followup of CHF.  He was recently in the hospital in the [place] for CHF exacerbation.  Had initial post hospital f/u w/ Dr. Shirlee Latch on 08/08/20. At that time, he was still volume overloaded, Wt was up ~10 lb and he continued w/ bilateral LEE and orthopnea. He had decreased breath sounds on the right on lung exam, concerning for effusion. CXR was ordered and confirmed interval development of a small-moderate rt pleural effusion w/ cardiomegally and pulmonary vascular congestion c/w mild pulmonary edema. He was instructed to continue torsemide 80 mg bid and to add 2.5 of metolazone. He was instructed to take 3 days of metolazone the first week (Sat, Sun + Wed), followed by once weekly qWednesdays. He is followed by paramedicine.   He is here today w/ his wife. His wt is down 8 lb since his visit last  week, from 203>>195 lb. BP 110/66. ReDs Clip 29%. LEE improved but still w/ 2+ edema around his ankles. Awaiting home health to place unna boots.    Labs (4/20): K 4.1, creatinine 2.23, LDL 58 Labs (6/20): TSH 43, K 4.6, creatinine 1.67 Labs (8/20): K 5.1, creatinine 1.66 Labs (10/20): K 4.9 => 5, creatinine 1.89 => 1.5, LDL 65, HDL 32, hgb 10.4 Labs (3/21): K 3.7, creatinine 2 Labs (4/21): K 4.2, creatinine 1.72, TSH normal Labs (9/21): K 4.0, creatinine 1.99, TSH normal, hgb 8.7    PMH: 1. Atrial fibrillation: Paroxysmal. 2. CKD: Stage 3. 3. Type 2 diabetes 4. Hyperlipidemia 5. HTN 6. Nephrolithiasis 7. ?NAFLD 8. Gout  9. CAD: LHC (9/19) with 40% distal LM, 50% ostial LAD, 50% mid LAD, 75% proximal LAD treated with DES.  10. Chronic hypoxemic respiratory failure. Has home oxygen. ?COPD: Prior smoker.  11. CHF: EF low since 2014. Primarily nonischemic cardiomyopathy. Biotronik ICD. - Echo (12/19) with EF 20-25%, diffuse hypokinesis, moderate MR, mildly dilated RV with moderately decreased systolic function, PASP 39 mmHg.  - ACEI angioedema.  - Echo (4/21): EF 25% with moderate LV dilation and inferior/inferolateral AK; mildly decreased RV systolic function; moderate MR.  12. Hypothyroidism 13. Fe deficiency anemia.    Current Outpatient Medications  Medication Sig Dispense Refill  . allopurinol (ZYLOPRIM) 100 MG tablet Take 200 mg by mouth daily.     Marland Kitchen amiodarone (PACERONE) 200 MG tablet Take 1 tablet (200 mg total) by mouth  daily. 90 tablet 3  . apixaban (ELIQUIS) 2.5 MG TABS tablet Take 1 tablet (2.5 mg total) by mouth 2 (two) times daily.    . cyanocobalamin 1000 MCG tablet Take 1 tablet (1,000 mcg total) by mouth daily. 90 tablet 0  . diclofenac Sodium (VOLTAREN) 1 % GEL Apply 4 g topically 4 (four) times daily. 50 g 0  . hydrALAZINE (APRESOLINE) 25 MG tablet Take 0.5 tablets (12.5 mg total) by mouth every 8 (eight) hours. 90 tablet 0  . isosorbide dinitrate (ISORDIL) 10 MG  tablet Take 10 mg by mouth 3 (three) times daily.    . magnesium oxide (MAG-OX) 400 MG tablet Take 400 mg by mouth daily.     . metolazone (ZAROXOLYN) 2.5 MG tablet Take 1 tablet (2.5 mg total) by mouth once a week. On wednesdays 8 tablet 2  . polyethylene glycol (MIRALAX / GLYCOLAX) 17 g packet Take 17 g by mouth 2 (two) times daily. 14 each 0  . potassium chloride SA (KLOR-CON) 20 MEQ tablet Take 1 tablet (20 mEq total) by mouth once a week. Only on metolazone days 13 tablet 3  . rosuvastatin (CRESTOR) 10 MG tablet Take 1 tablet (10 mg total) by mouth daily at 6 PM. 30 tablet 5  . senna-docusate (SENOKOT-S) 8.6-50 MG tablet Take 1 tablet by mouth 2 (two) times daily. 30 tablet 0  . sertraline (ZOLOFT) 25 MG tablet Take 25 mg by mouth daily.     Marland Kitchen spironolactone (ALDACTONE) 25 MG tablet Take 12.5 mg by mouth at bedtime.    . tamsulosin (FLOMAX) 0.4 MG CAPS capsule Take 0.4 mg by mouth at bedtime.    . torsemide (DEMADEX) 20 MG tablet Take 4 tablets (80 mg total) by mouth in the morning AND 2 tablets (40 mg total) every evening. 180 tablet 11  . finasteride (PROSCAR) 5 MG tablet Take 1 tablet (5 mg total) by mouth daily. 30 tablet 0  . ipratropium-albuterol (DUONEB) 0.5-2.5 (3) MG/3ML SOLN Take 3 mLs by nebulization every 4 (four) hours as needed. (Patient not taking: Reported on 08/13/2020) 360 mL 0  . levothyroxine (SYNTHROID) 125 MCG tablet Take 1 tablet (125 mcg total) by mouth daily before breakfast. (Patient not taking: Reported on 08/13/2020) 90 tablet 1   No current facility-administered medications for this encounter.    Allergies:   Lisinopril and Xarelto [rivaroxaban]   Social History:  The patient  reports that he has quit smoking. His smoking use included cigarettes and cigars. He has a 15.00 pack-year smoking history. He has quit using smokeless tobacco. He reports that he does not drink alcohol and does not use drugs.   Family History:  The patient's family history includes Heart  Problems in his maternal grandmother; Heart disease in his mother; Other in an other family member.   ROS:  Please see the history of present illness.   All other systems are personally reviewed and negative.   Vitals:  BP 110/66 (BP Location: Left Arm, Patient Position: Sitting, Cuff Size: Normal)   Pulse 88   Ht 5\' 7"  (1.702 m)   Wt 88.6 kg (195 lb 6.4 oz)   SpO2 97%   BMI 30.60 kg/m    PHYSICAL EXAM: ReDs Clip 29%  General:  Well appearing, elderly male in wheel chair. No respiratory difficulty HEENT: normal Neck: supple. no JVD. Carotids 2+ bilat; no bruits. No lymphadenopathy or thyromegaly appreciated. Cor: PMI nondisplaced. Regular rate & rhythm. No rubs, gallops or murmurs. Lungs: slightly decreased BS  at the right base, otherwise clear. No wheezing or crackles  Abdomen: soft, nontender, nondistended. No hepatosplenomegaly. No bruits or masses. Good bowel sounds. Extremities: no cyanosis, clubbing, rash, 2+ bilateral pitting edema ~2 inches proximal to ankle  Neuro: alert & oriented x 3, cranial nerves grossly intact. moves all 4 extremities w/o difficulty. Affect pleasant.    Recent Labs: 02/11/2020: B Natriuretic Peptide 1,569.0 02/24/2020: Magnesium 1.7 08/08/2020: ALT 30; BUN 41; Creatinine, Ser 1.99; Hemoglobin 8.7; Platelets 258; Potassium 4.0; Sodium 135; TSH 2.705  Personally reviewed   Wt Readings from Last 3 Encounters:  08/13/20 88.6 kg (195 lb 6.4 oz)  08/08/20 92.2 kg (203 lb 3.2 oz)  08/07/20 89.8 kg (198 lb)    ASSESSMENT AND PLAN:  1. Chronic systolic CHF: Primarily nonischemic cardiomyopathy. EF has been low since at least 2014.  He has single chamber Biotronik ICD. QRS not wide enough for CRT.Settings adjusted in the past to prevent RV pacing in the face of a long 1st degree AV block.  Echo in 4/21 showed EF 25% with moderate LV dilation and inferior/inferolateral AK; mildly decreased RV systolic function; moderate MR. NYHA class III. LEE and wt improved  after recent diuretic increase. Wt down 8 lb in the last week, ReDS clip 29% but still w/ residual bilateral LEE, 1/4 up both legs.   - continue torsemide 80 mg bid + 2.5 of metolazone qWednesdays - encouraged to elevated legs to help w/ residual edema - remote health to leg wraps  - Continue spironolactone 12.5 mg qhs.   - Continue hydralazine 12.5 mg tid and Isordil 10 mg tid.     -Have been holding off on beta blocker given long 1st degree AVB to limit RV pacing.  -No ACEI or ARNI with angioedema on lisinopril. - Would hold off on ARB with CKD stage 3 and h/o AKI.  - Check BMP today  2. CAD: S/p PCI 07/2018 to pLAD but CAD does not explain his cardiomyopathy.  No chest pain.  - He is off Plavix now, on Eliquis for his atrial fibrillation.  - Continue Crestor 3.Atrial fibrillation: Paroxysmal.  In NSR on EKG last week, RRR on exam today - Continue Eliquis 2.5 mg bid (age, creatinine > 1.5).  - Continue amiodarone 200 mg daily, LFTs and TSH checked last visit and ok. He needs a regular eye exam while on amiodarone.  4. CKD: Stage 3.  Check BMP today after recent diuretic adjustment  5. Hypothyroidism: Suspect this is due to amiodarone.  Levoxyl per PCP.  TSH normal 9/21.   Will arrange close f/u. Return back in 4 weeks.    Signed, Robbie Lis, PA-C  08/13/2020  [facility] [address] Heart and Vascular [place] [address] [phone] (office) [phone] (fax)

## 2020-08-14 ENCOUNTER — Other Ambulatory Visit (HOSPITAL_COMMUNITY): Payer: Self-pay

## 2020-08-14 NOTE — Progress Notes (Signed)
Paramedicine Encounter    Patient ID: Ryan Macdonald, male    DOB: Feb 23, 1934, 86 y.o.   MRN: [id number]   Patient Care Team: [facility] as PCP - General (General Practice) [name], MD as PCP - Cardiology (Cardiology) Laurey Morale, MD as PCP - Advanced Heart Failure (Cardiology)  Patient Active Problem List   Diagnosis Date Noted  . Hematuria 02/15/2020  . CKD (chronic kidney disease), stage III 02/15/2020  . Constipation 02/13/2020  . Anemia 02/13/2020  . Secondary hypercoagulable state (HCC) 01/16/2020  . AKI (acute kidney injury) (HCC) 03/14/2019  . Spasm of muscle of lower back 03/14/2019  . Acute renal failure (ARF) (HCC) 03/09/2019  . Transaminitis   . Acute on chronic renal failure (HCC) 03/08/2019  . Acute exacerbation of CHF (congestive heart failure) (HCC) 11/23/2018  . Acute on chronic heart failure (HCC) 10/19/2018  . Pulmonary emphysema (HCC)   . Chronic respiratory failure with hypoxia (HCC)   . Morbid obesity (HCC) 09/09/2018  . URI (upper respiratory infection) 08/31/2018  . UTI (urinary tract infection) 08/31/2018  . Chronic systolic (congestive) heart failure (HCC) 08/28/2018  . CHF (congestive heart failure) (HCC) 08/14/2018  . Acute on chronic combined systolic and diastolic CHF (congestive heart failure) (HCC) 07/18/2018  . First degree AV block   . Shortness of breath 04/29/2018  . Acute HFrEF (heart failure with reduced ejection fraction) (HCC) 04/29/2018  . Acute respiratory failure with hypoxia (HCC) 09/27/2017  . CHF exacerbation (HCC) 09/27/2017  . Renal insufficiency   . Acute on chronic systolic CHF (congestive heart failure) (HCC) 07/08/2017  . Left knee pain 07/08/2017  . Acute bronchitis 05/10/2017  . Acute congestive heart failure (HCC) 04/14/2017  . Elevated troponin 04/14/2017  . Lactic acidosis   . Acute on chronic systolic heart failure, NYHA class 2 (HCC) 02/03/2017  . Diabetes mellitus type 2 in obese  (HCC) 02/03/2017  . CAP (community acquired pneumonia) 02/03/2017  . Lobar pneumonia (HCC)   . Diabetes mellitus with complication (HCC)   . Acute on chronic respiratory failure with hypoxia (HCC)   . Acute on chronic combined systolic and diastolic heart failure (HCC) 05/19/2016  . Depression 05/19/2016  . Gout 05/19/2016  . 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 NSVT (nonsustained ventricular tachycardia) (HCC)   . Chronic renal insufficiency, stage 3 (moderate) (HCC)   . Hyperkalemia 08/21/2015  . Pulmonary embolism (HCC)   . Acute respiratory failure (HCC)   . Osteoarthritis of left hip 10/25/2013  . Atrial fibrillation (HCC) 10/25/2013  . S/P hip replacement 10/24/2013  . Nonischemic cardiomyopathy (HCC)   . Obesity (BMI 30-39.9)   . Essential hypertension   . Hyperlipidemia     Current Outpatient Medications:  .  allopurinol (ZYLOPRIM) 100 MG tablet, Take 200 mg by mouth daily. , Disp: , Rfl:  .  amiodarone (PACERONE) 200 MG tablet, Take 1 tablet (200 mg total) by mouth daily., Disp: 90 tablet, Rfl: 3 .  apixaban (ELIQUIS) 2.5 MG TABS tablet, Take 1 tablet (2.5 mg total) by mouth 2 (two) times daily., Disp: , Rfl:  .  cyanocobalamin 1000 MCG tablet, Take 1 tablet (1,000 mcg total) by mouth daily., Disp: 90 tablet, Rfl: 0 .  diclofenac Sodium (VOLTAREN) 1 % GEL, Apply 4 g topically 4 (four) times daily., Disp: 50 g, Rfl: 0 .  finasteride (PROSCAR) 5 MG tablet, Take 1 tablet (5 mg total) by mouth daily., Disp: 30 tablet, Rfl: 0 .  hydrALAZINE (APRESOLINE)  25 MG tablet, Take 0.5 tablets (12.5 mg total) by mouth every 8 (eight) hours., Disp: 90 tablet, Rfl: 0 .  ipratropium-albuterol (DUONEB) 0.5-2.5 (3) MG/3ML SOLN, Take 3 mLs by nebulization every 4 (four) hours as needed. (Patient not taking: Reported on 08/13/2020), Disp: 360 mL, Rfl: 0 .  isosorbide dinitrate (ISORDIL) 10 MG tablet, Take 10 mg by mouth 3 (three) times daily., Disp: , Rfl:  .   levothyroxine (SYNTHROID) 125 MCG tablet, Take 1 tablet (125 mcg total) by mouth daily before breakfast. (Patient not taking: Reported on 08/13/2020), Disp: 90 tablet, Rfl: 1 .  magnesium oxide (MAG-OX) 400 MG tablet, Take 400 mg by mouth daily. , Disp: , Rfl:  .  metolazone (ZAROXOLYN) 2.5 MG tablet, Take 1 tablet (2.5 mg total) by mouth once a week. On wednesdays, Disp: 8 tablet, Rfl: 2 .  polyethylene glycol (MIRALAX / GLYCOLAX) 17 g packet, Take 17 g by mouth 2 (two) times daily., Disp: 14 each, Rfl: 0 .  potassium chloride SA (KLOR-CON) 20 MEQ tablet, Take 1 tablet (20 mEq total) by mouth once a week. Only on metolazone days, Disp: 13 tablet, Rfl: 3 .  rosuvastatin (CRESTOR) 10 MG tablet, Take 1 tablet (10 mg total) by mouth daily at 6 PM., Disp: 30 tablet, Rfl: 5 .  senna-docusate (SENOKOT-S) 8.6-50 MG tablet, Take 1 tablet by mouth 2 (two) times daily., Disp: 30 tablet, Rfl: 0 .  sertraline (ZOLOFT) 25 MG tablet, Take 25 mg by mouth daily. , Disp: , Rfl:  .  spironolactone (ALDACTONE) 25 MG tablet, Take 12.5 mg by mouth at bedtime., Disp: , Rfl:  .  tamsulosin (FLOMAX) 0.4 MG CAPS capsule, Take 0.4 mg by mouth at bedtime., Disp: , Rfl:  .  torsemide (DEMADEX) 20 MG tablet, Take 4 tablets (80 mg total) by mouth in the morning AND 2 tablets (40 mg total) every evening., Disp: 180 tablet, Rfl: 11 Allergies  Allergen Reactions  . Lisinopril Swelling    Facial swelling  . Xarelto [Rivaroxaban] Other (See Comments)    "Blood came out of my penis"      Social History   Socioeconomic History  . Marital status: Married    Spouse name: Not on file  . Number of children: Not on file  . Years of education: Not on file  . Highest education level: Not on file  Occupational History  . Occupation: Retired    Comment: Former Public house manager  Tobacco Use  . Smoking status: Former Smoker    Packs/day: 1.00    Years: 15.00    Pack years: 15.00    Types: Cigarettes, Cigars  . Smokeless tobacco: Former  Neurosurgeon  . Tobacco comment: quit smoking ~ 50 yr ago  Vaping Use  . Vaping Use: Never used  Substance and Sexual Activity  . Alcohol use: No    Alcohol/week: 2.0 standard drinks    Types: 2 Cans of beer per week    Comment: rare beer.  . Drug use: No  . Sexual activity: Yes  Other Topics Concern  . Not on file  Social History Narrative   Lives in [place] with wife. Moved from [place] (2013). Does not routinely exercise.   Social Determinants of Health   Financial Resource Strain:   . Difficulty of Paying Living Expenses: Not on file  Food Insecurity:   . Worried About Programme researcher, broadcasting/film/video in the Last Year: Not on file  . Ran Out of Food in the Last Year: Not  on file  Transportation Needs:   . Lack of Transportation (Medical): Not on file  . Lack of Transportation (Non-Medical): Not on file  Physical Activity:   . Days of Exercise per Week: Not on file  . Minutes of Exercise per Session: Not on file  Stress:   . Feeling of Stress : Not on file  Social Connections:   . Frequency of Communication with Friends and Family: Not on file  . Frequency of Social Gatherings with Friends and Family: Not on file  . Attends Religious Services: Not on file  . Active Member of Clubs or Organizations: Not on file  . Attends Banker Meetings: Not on file  . Marital Status: Not on file  Intimate Partner Violence:   . Fear of Current or Ex-Partner: Not on file  . Emotionally Abused: Not on file  . Physically Abused: Not on file  . Sexually Abused: Not on file    Physical Exam      Future Appointments  Date Time Provider Department Center  08/20/2020 10:45 AM Felecia Shelling, DPM [facility]  08/21/2020 10:30 AM [facility] LAB [facility] None  09/19/2020  7:35 AM [facility] DEVICE REMOTES [facility]  09/22/2020  1:30 PM [facility] PA/NP [facility] None  12/19/2020  7:35 AM [facility] DEVICE REMOTES [facility]  03/20/2021  7:35 AM [facility] DEVICE  REMOTES [facility]  06/19/2021  7:35 AM [facility] DEVICE REMOTES [facility]  09/18/2021  7:35 AM [facility] DEVICE REMOTES [facility]    BP (!) 98/58   Pulse 86   Temp 98.7 F (37.1 C)   Resp 18   Wt 186 lb (84.4 kg)   SpO2 98%   BMI 29.13 kg/m   Weight yesterday-192 Last visit weight-198  Home visit today post clinic visit, he did get the metolazone and potassium and had been taking it, his legs are still very swollen up to the knee.  The nurse said they could teach him how to put on the wraps but they do not actually wrap the legs unless there is a wound there. It is very unrealistic that he or his wife will be able to put on those wraps correctly. He just needs to wear the compression stockings and get the kind that zips up. He said the [place] put in an order and they should have been mailed out to him but that's been over a week ago, maybe 2 wks ago and nothing yet. [facility].  I have sensed he has had some confusion the past week. He called me a few days ago to ask for the number to the [place], he always knew that number like the back of his hand. His wife also reports increased confusion with her as well. Talking about people that have passed like the wife just spoke to them on the phone, forgetting how to hang the phone up, I called him the other day and you couldn't get through and now I know it was because it was left off the hook and he didn't hang it up.  Wife reports his urine is clear, light in color, but has a strong smell to it like it smells like medicine. Will forward this info to doctors.    [name], EMT-Paramedic [phone] [facility] Paramedic  08/14/20

## 2020-08-16 ENCOUNTER — Emergency Department (HOSPITAL_COMMUNITY): Payer: Non-veteran care

## 2020-08-16 ENCOUNTER — Other Ambulatory Visit: Payer: Self-pay

## 2020-08-16 ENCOUNTER — Inpatient Hospital Stay (HOSPITAL_COMMUNITY)
Admission: EM | Admit: 2020-08-16 | Discharge: 2020-08-21 | DRG: 871 | Disposition: A | Discharge status: Skilled Nursing Facility | Payer: Non-veteran care | Attending: Internal Medicine | Admitting: Internal Medicine

## 2020-08-16 ENCOUNTER — Encounter (HOSPITAL_COMMUNITY): Payer: Self-pay | Admitting: *Deleted

## 2020-08-16 DIAGNOSIS — Z95810 Presence of automatic (implantable) cardiac defibrillator: Secondary | ICD-10-CM | POA: Diagnosis not present

## 2020-08-16 DIAGNOSIS — I10 Essential (primary) hypertension: Secondary | ICD-10-CM | POA: Diagnosis not present

## 2020-08-16 DIAGNOSIS — I248 Other forms of acute ischemic heart disease: Secondary | ICD-10-CM | POA: Diagnosis present

## 2020-08-16 DIAGNOSIS — Z6831 Body mass index (BMI) 31.0-31.9, adult: Secondary | ICD-10-CM

## 2020-08-16 DIAGNOSIS — N1832 Chronic kidney disease, stage 3b: Secondary | ICD-10-CM | POA: Diagnosis present

## 2020-08-16 DIAGNOSIS — G8929 Other chronic pain: Secondary | ICD-10-CM | POA: Diagnosis present

## 2020-08-16 DIAGNOSIS — A419 Sepsis, unspecified organism: Secondary | ICD-10-CM | POA: Diagnosis not present

## 2020-08-16 DIAGNOSIS — Z955 Presence of coronary angioplasty implant and graft: Secondary | ICD-10-CM

## 2020-08-16 DIAGNOSIS — Z87891 Personal history of nicotine dependence: Secondary | ICD-10-CM

## 2020-08-16 DIAGNOSIS — Z79890 Hormone replacement therapy (postmenopausal): Secondary | ICD-10-CM

## 2020-08-16 DIAGNOSIS — Z9981 Dependence on supplemental oxygen: Secondary | ICD-10-CM

## 2020-08-16 DIAGNOSIS — E785 Hyperlipidemia, unspecified: Secondary | ICD-10-CM | POA: Diagnosis present

## 2020-08-16 DIAGNOSIS — M109 Gout, unspecified: Secondary | ICD-10-CM | POA: Diagnosis present

## 2020-08-16 DIAGNOSIS — E11649 Type 2 diabetes mellitus with hypoglycemia without coma: Secondary | ICD-10-CM | POA: Diagnosis not present

## 2020-08-16 DIAGNOSIS — E1122 Type 2 diabetes mellitus with diabetic chronic kidney disease: Secondary | ICD-10-CM | POA: Diagnosis present

## 2020-08-16 DIAGNOSIS — D638 Anemia in other chronic diseases classified elsewhere: Secondary | ICD-10-CM | POA: Diagnosis not present

## 2020-08-16 DIAGNOSIS — D631 Anemia in chronic kidney disease: Secondary | ICD-10-CM | POA: Diagnosis present

## 2020-08-16 DIAGNOSIS — A4151 Sepsis due to Escherichia coli [E. coli]: Principal | ICD-10-CM | POA: Diagnosis present

## 2020-08-16 DIAGNOSIS — N390 Urinary tract infection, site not specified: Secondary | ICD-10-CM | POA: Diagnosis present

## 2020-08-16 DIAGNOSIS — I472 Ventricular tachycardia: Secondary | ICD-10-CM | POA: Diagnosis not present

## 2020-08-16 DIAGNOSIS — Z8249 Family history of ischemic heart disease and other diseases of the circulatory system: Secondary | ICD-10-CM

## 2020-08-16 DIAGNOSIS — R4182 Altered mental status, unspecified: Secondary | ICD-10-CM | POA: Diagnosis not present

## 2020-08-16 DIAGNOSIS — J9611 Chronic respiratory failure with hypoxia: Secondary | ICD-10-CM | POA: Diagnosis present

## 2020-08-16 DIAGNOSIS — F329 Major depressive disorder, single episode, unspecified: Secondary | ICD-10-CM | POA: Diagnosis present

## 2020-08-16 DIAGNOSIS — I5043 Acute on chronic combined systolic (congestive) and diastolic (congestive) heart failure: Secondary | ICD-10-CM | POA: Diagnosis present

## 2020-08-16 DIAGNOSIS — R7989 Other specified abnormal findings of blood chemistry: Secondary | ICD-10-CM | POA: Diagnosis present

## 2020-08-16 DIAGNOSIS — Z96642 Presence of left artificial hip joint: Secondary | ICD-10-CM | POA: Diagnosis present

## 2020-08-16 DIAGNOSIS — R6520 Severe sepsis without septic shock: Secondary | ICD-10-CM | POA: Diagnosis not present

## 2020-08-16 DIAGNOSIS — Z87442 Personal history of urinary calculi: Secondary | ICD-10-CM

## 2020-08-16 DIAGNOSIS — E039 Hypothyroidism, unspecified: Secondary | ICD-10-CM | POA: Diagnosis present

## 2020-08-16 DIAGNOSIS — I083 Combined rheumatic disorders of mitral, aortic and tricuspid valves: Secondary | ICD-10-CM | POA: Diagnosis present

## 2020-08-16 DIAGNOSIS — N179 Acute kidney failure, unspecified: Secondary | ICD-10-CM | POA: Diagnosis present

## 2020-08-16 DIAGNOSIS — M1990 Unspecified osteoarthritis, unspecified site: Secondary | ICD-10-CM | POA: Diagnosis present

## 2020-08-16 DIAGNOSIS — I130 Hypertensive heart and chronic kidney disease with heart failure and stage 1 through stage 4 chronic kidney disease, or unspecified chronic kidney disease: Secondary | ICD-10-CM | POA: Diagnosis present

## 2020-08-16 DIAGNOSIS — E7800 Pure hypercholesterolemia, unspecified: Secondary | ICD-10-CM | POA: Diagnosis not present

## 2020-08-16 DIAGNOSIS — I5022 Chronic systolic (congestive) heart failure: Secondary | ICD-10-CM | POA: Diagnosis not present

## 2020-08-16 DIAGNOSIS — N170 Acute kidney failure with tubular necrosis: Secondary | ICD-10-CM | POA: Diagnosis not present

## 2020-08-16 DIAGNOSIS — E872 Acidosis, unspecified: Secondary | ICD-10-CM | POA: Diagnosis present

## 2020-08-16 DIAGNOSIS — D509 Iron deficiency anemia, unspecified: Secondary | ICD-10-CM | POA: Diagnosis present

## 2020-08-16 DIAGNOSIS — E876 Hypokalemia: Secondary | ICD-10-CM | POA: Diagnosis not present

## 2020-08-16 DIAGNOSIS — F32A Depression, unspecified: Secondary | ICD-10-CM | POA: Diagnosis present

## 2020-08-16 DIAGNOSIS — I2510 Atherosclerotic heart disease of native coronary artery without angina pectoris: Secondary | ICD-10-CM | POA: Diagnosis present

## 2020-08-16 DIAGNOSIS — Z87440 Personal history of urinary (tract) infections: Secondary | ICD-10-CM

## 2020-08-16 DIAGNOSIS — R6521 Severe sepsis with septic shock: Secondary | ICD-10-CM | POA: Diagnosis present

## 2020-08-16 DIAGNOSIS — Z79899 Other long term (current) drug therapy: Secondary | ICD-10-CM

## 2020-08-16 DIAGNOSIS — I428 Other cardiomyopathies: Secondary | ICD-10-CM

## 2020-08-16 DIAGNOSIS — I350 Nonrheumatic aortic (valve) stenosis: Secondary | ICD-10-CM | POA: Diagnosis not present

## 2020-08-16 DIAGNOSIS — Z888 Allergy status to other drugs, medicaments and biological substances status: Secondary | ICD-10-CM

## 2020-08-16 DIAGNOSIS — Z20822 Contact with and (suspected) exposure to covid-19: Secondary | ICD-10-CM | POA: Diagnosis present

## 2020-08-16 DIAGNOSIS — I440 Atrioventricular block, first degree: Secondary | ICD-10-CM | POA: Diagnosis present

## 2020-08-16 DIAGNOSIS — E1169 Type 2 diabetes mellitus with other specified complication: Secondary | ICD-10-CM | POA: Diagnosis present

## 2020-08-16 DIAGNOSIS — N400 Enlarged prostate without lower urinary tract symptoms: Secondary | ICD-10-CM | POA: Diagnosis present

## 2020-08-16 DIAGNOSIS — R778 Other specified abnormalities of plasma proteins: Secondary | ICD-10-CM | POA: Diagnosis present

## 2020-08-16 DIAGNOSIS — I480 Paroxysmal atrial fibrillation: Secondary | ICD-10-CM | POA: Diagnosis present

## 2020-08-16 DIAGNOSIS — I340 Nonrheumatic mitral (valve) insufficiency: Secondary | ICD-10-CM | POA: Diagnosis not present

## 2020-08-16 DIAGNOSIS — N183 Chronic kidney disease, stage 3 unspecified: Secondary | ICD-10-CM | POA: Diagnosis not present

## 2020-08-16 DIAGNOSIS — E669 Obesity, unspecified: Secondary | ICD-10-CM | POA: Diagnosis present

## 2020-08-16 DIAGNOSIS — Z7901 Long term (current) use of anticoagulants: Secondary | ICD-10-CM

## 2020-08-16 DIAGNOSIS — R52 Pain, unspecified: Secondary | ICD-10-CM

## 2020-08-16 LAB — CBC WITH DIFFERENTIAL/PLATELET
Abs Immature Granulocytes: 0.04 10*3/uL (ref 0.00–0.07)
Basophils Absolute: 0 10*3/uL (ref 0.0–0.1)
Basophils Relative: 0 %
Eosinophils Absolute: 0 10*3/uL (ref 0.0–0.5)
Eosinophils Relative: 0 %
HCT: 26.1 % — ABNORMAL LOW (ref 39.0–52.0)
Hemoglobin: 8.2 g/dL — ABNORMAL LOW (ref 13.0–17.0)
Immature Granulocytes: 1 %
Lymphocytes Relative: 4 %
Lymphs Abs: 0.3 10*3/uL — ABNORMAL LOW (ref 0.7–4.0)
MCH: 28.1 pg (ref 26.0–34.0)
MCHC: 31.4 g/dL (ref 30.0–36.0)
MCV: 89.4 fL (ref 80.0–100.0)
Monocytes Absolute: 1 10*3/uL (ref 0.1–1.0)
Monocytes Relative: 13 %
Neutro Abs: 6 10*3/uL (ref 1.7–7.7)
Neutrophils Relative %: 82 %
Platelets: 254 10*3/uL (ref 150–400)
RBC: 2.92 MIL/uL — ABNORMAL LOW (ref 4.22–5.81)
RDW: 25.8 % — ABNORMAL HIGH (ref 11.5–15.5)
WBC: 7.3 10*3/uL (ref 4.0–10.5)
nRBC: 0 % (ref 0.0–0.2)

## 2020-08-16 LAB — URINALYSIS, ROUTINE W REFLEX MICROSCOPIC
Bilirubin Urine: NEGATIVE
Glucose, UA: NEGATIVE mg/dL
Ketones, ur: NEGATIVE mg/dL
Nitrite: NEGATIVE
Protein, ur: 30 mg/dL — AB
RBC / HPF: >50 RBC/hpf — ABNORMAL HIGH (ref 0–5)
Specific Gravity, Urine: 1.009 (ref 1.005–1.030)
WBC, UA: >50 WBC/hpf — ABNORMAL HIGH (ref 0–5)
pH: 6 (ref 5.0–8.0)

## 2020-08-16 LAB — CBC
HCT: 25.5 % — ABNORMAL LOW (ref 39.0–52.0)
Hemoglobin: 8 g/dL — ABNORMAL LOW (ref 13.0–17.0)
MCH: 27.5 pg (ref 26.0–34.0)
MCHC: 31.4 g/dL (ref 30.0–36.0)
MCV: 87.6 fL (ref 80.0–100.0)
Platelets: 211 10*3/uL (ref 150–400)
RBC: 2.91 MIL/uL — ABNORMAL LOW (ref 4.22–5.81)
RDW: 25.8 % — ABNORMAL HIGH (ref 11.5–15.5)
WBC: 7.3 10*3/uL (ref 4.0–10.5)
nRBC: 0 % (ref 0.0–0.2)

## 2020-08-16 LAB — MAGNESIUM: Magnesium: 2 mg/dL (ref 1.7–2.4)

## 2020-08-16 LAB — COMPREHENSIVE METABOLIC PANEL
ALT: 27 U/L (ref 0–44)
AST: 82 U/L — ABNORMAL HIGH (ref 15–41)
Albumin: 2.2 g/dL — ABNORMAL LOW (ref 3.5–5.0)
Alkaline Phosphatase: 76 U/L (ref 38–126)
Anion gap: 17 — ABNORMAL HIGH (ref 5–15)
BUN: 75 mg/dL — ABNORMAL HIGH (ref 8–23)
CO2: 21 mmol/L — ABNORMAL LOW (ref 22–32)
Calcium: 9 mg/dL (ref 8.9–10.3)
Chloride: 97 mmol/L — ABNORMAL LOW (ref 98–111)
Creatinine, Ser: 2.89 mg/dL — ABNORMAL HIGH (ref 0.61–1.24)
GFR calc Af Amer: 22 mL/min — ABNORMAL LOW (ref >60)
GFR calc non Af Amer: 19 mL/min — ABNORMAL LOW (ref >60)
Glucose, Bld: 158 mg/dL — ABNORMAL HIGH (ref 70–99)
Potassium: 3.5 mmol/L (ref 3.5–5.1)
Sodium: 135 mmol/L (ref 135–145)
Total Bilirubin: 1 mg/dL (ref 0.3–1.2)
Total Protein: 7.8 g/dL (ref 6.5–8.1)

## 2020-08-16 LAB — PROTIME-INR
INR: 1.8 — ABNORMAL HIGH (ref 0.8–1.2)
Prothrombin Time: 20.4 seconds — ABNORMAL HIGH (ref 11.4–15.2)

## 2020-08-16 LAB — CREATININE, SERUM
Creatinine, Ser: 2.38 mg/dL — ABNORMAL HIGH (ref 0.61–1.24)
GFR calc Af Amer: 28 mL/min — ABNORMAL LOW (ref >60)
GFR calc non Af Amer: 24 mL/min — ABNORMAL LOW (ref >60)

## 2020-08-16 LAB — PHOSPHORUS: Phosphorus: 4 mg/dL (ref 2.5–4.6)

## 2020-08-16 LAB — SARS CORONAVIRUS 2 BY RT PCR (HOSPITAL ORDER, PERFORMED IN ~~LOC~~ HOSPITAL LAB): SARS Coronavirus 2: NEGATIVE

## 2020-08-16 LAB — PROCALCITONIN: Procalcitonin: 1.24 ng/mL

## 2020-08-16 LAB — TROPONIN I (HIGH SENSITIVITY)
Troponin I (High Sensitivity): 732 ng/L (ref <18)
Troponin I (High Sensitivity): 1147 ng/L (ref <18)
Troponin I (High Sensitivity): 1743 ng/L (ref <18)

## 2020-08-16 LAB — STREP PNEUMONIAE URINARY ANTIGEN: Strep Pneumo Urinary Antigen: NEGATIVE

## 2020-08-16 LAB — TSH: TSH: 1.475 u[IU]/mL (ref 0.350–4.500)

## 2020-08-16 LAB — CBG MONITORING, ED: Glucose-Capillary: 162 mg/dL — ABNORMAL HIGH (ref 70–99)

## 2020-08-16 LAB — LACTIC ACID, PLASMA
Lactic Acid, Venous: 4.4 mmol/L (ref 0.5–1.9)
Lactic Acid, Venous: 2.4 mmol/L (ref 0.5–1.9)

## 2020-08-16 LAB — APTT: aPTT: 56 seconds — ABNORMAL HIGH (ref 24–36)

## 2020-08-16 LAB — CK: Total CK: 732 U/L — ABNORMAL HIGH (ref 49–397)

## 2020-08-16 MED ORDER — SODIUM CHLORIDE 0.9 % IV SOLN
2.0000 g | INTRAVENOUS | Status: DC
  Administered 2020-08-17: 2 g via INTRAVENOUS
  Filled 2020-08-16 (×2): qty 20

## 2020-08-16 MED ORDER — SODIUM CHLORIDE 0.9 % IV SOLN
500.0000 mg | Freq: Once | INTRAVENOUS | Status: AC
  Administered 2020-08-16: 500 mg via INTRAVENOUS
  Filled 2020-08-16: qty 500

## 2020-08-16 MED ORDER — SERTRALINE HCL 50 MG PO TABS
25.0000 mg | ORAL_TABLET | Freq: Every day | ORAL | Status: DC
  Administered 2020-08-17 – 2020-08-21 (×5): 25 mg via ORAL
  Filled 2020-08-16 (×5): qty 1

## 2020-08-16 MED ORDER — IPRATROPIUM-ALBUTEROL 0.5-2.5 (3) MG/3ML IN SOLN: 3.0000 mL | RESPIRATORY_TRACT | Status: DC | PRN

## 2020-08-16 MED ORDER — SODIUM CHLORIDE 0.9 % IV BOLUS
500.0000 mL | Freq: Once | INTRAVENOUS | Status: AC
  Administered 2020-08-16: 500 mL via INTRAVENOUS

## 2020-08-16 MED ORDER — ACETAMINOPHEN 650 MG RE SUPP: 650.0000 mg | Freq: Four times a day (QID) | RECTAL | Status: DC | PRN

## 2020-08-16 MED ORDER — ONDANSETRON HCL 4 MG PO TABS: 4.0000 mg | ORAL_TABLET | Freq: Four times a day (QID) | ORAL | Status: DC | PRN

## 2020-08-16 MED ORDER — ACETAMINOPHEN 325 MG PO TABS: 650.0000 mg | ORAL_TABLET | Freq: Four times a day (QID) | ORAL | Status: DC | PRN

## 2020-08-16 MED ORDER — SODIUM CHLORIDE 0.9 % IV SOLN
500.0000 mg | INTRAVENOUS | Status: DC
  Administered 2020-08-17: 500 mg via INTRAVENOUS
  Filled 2020-08-16 (×2): qty 500

## 2020-08-16 MED ORDER — SODIUM CHLORIDE 0.9 % IV SOLN
2.0000 g | Freq: Once | INTRAVENOUS | Status: AC
  Administered 2020-08-16: 2 g via INTRAVENOUS
  Filled 2020-08-16: qty 20

## 2020-08-16 MED ORDER — ONDANSETRON HCL 4 MG/2ML IJ SOLN: 4.0000 mg | Freq: Four times a day (QID) | INTRAMUSCULAR | Status: DC | PRN

## 2020-08-16 MED ORDER — SODIUM CHLORIDE 0.9 % IV BOLUS
1000.0000 mL | Freq: Once | INTRAVENOUS | Status: AC
  Administered 2020-08-16: 1000 mL via INTRAVENOUS

## 2020-08-16 MED ORDER — AMIODARONE HCL 200 MG PO TABS
200.0000 mg | ORAL_TABLET | Freq: Every day | ORAL | Status: DC
  Administered 2020-08-17 – 2020-08-21 (×5): 200 mg via ORAL
  Filled 2020-08-16 (×5): qty 1

## 2020-08-16 MED ORDER — ROSUVASTATIN CALCIUM 5 MG PO TABS
10.0000 mg | ORAL_TABLET | Freq: Every day | ORAL | Status: DC
  Administered 2020-08-16 – 2020-08-20 (×5): 10 mg via ORAL
  Filled 2020-08-16 (×5): qty 2

## 2020-08-16 MED ORDER — LEVOTHYROXINE SODIUM 25 MCG PO TABS
125.0000 ug | ORAL_TABLET | Freq: Every day | ORAL | Status: DC
  Administered 2020-08-17 – 2020-08-21 (×5): 125 ug via ORAL
  Filled 2020-08-16 (×5): qty 1

## 2020-08-16 MED ORDER — SODIUM CHLORIDE 0.9 % IV SOLN: INTRAVENOUS | Status: DC

## 2020-08-16 MED ORDER — POLYETHYLENE GLYCOL 3350 17 G PO PACK
17.0000 g | PACK | Freq: Two times a day (BID) | ORAL | Status: DC
  Administered 2020-08-16 – 2020-08-21 (×9): 17 g via ORAL
  Filled 2020-08-16 (×9): qty 1

## 2020-08-16 MED ORDER — SENNOSIDES-DOCUSATE SODIUM 8.6-50 MG PO TABS
1.0000 | ORAL_TABLET | Freq: Two times a day (BID) | ORAL | Status: DC
  Administered 2020-08-16 – 2020-08-21 (×10): 1 via ORAL
  Filled 2020-08-16 (×10): qty 1

## 2020-08-16 MED ORDER — HEPARIN SODIUM (PORCINE) 5000 UNIT/ML IJ SOLN: 5000.0000 [IU] | Freq: Three times a day (TID) | INTRAMUSCULAR | Status: DC

## 2020-08-16 MED ORDER — APIXABAN 2.5 MG PO TABS
2.5000 mg | ORAL_TABLET | Freq: Two times a day (BID) | ORAL | Status: DC
  Administered 2020-08-16 – 2020-08-21 (×10): 2.5 mg via ORAL
  Filled 2020-08-16 (×11): qty 1

## 2020-08-16 NOTE — ED Triage Notes (Signed)
Pt from home via EMS . EMS called to Pt home 2 times today for falls. Pt was also reported to have AMS and weakness. Pt hypotensive at home EMS gave of Iv fluids .  EMS vitals BP 80 / 33, 103 HR , CBG 148, 98% on 2 liters EMS places. Pt does not use O2 art home.

## 2020-08-16 NOTE — Sepsis Progress Note (Signed)
Notified provider of need to order additional  fluid bolus. 

## 2020-08-16 NOTE — ED Provider Notes (Signed)
[facility] Emergency Department Provider Note MRN:  [id number]  Arrival date & time: 08/16/20     Chief Complaint   Possible sepsis History of Present Illness   Ryan Macdonald is a 86 y.o. year-old male with a history of diabetes, CHF presenting to the ED with chief complaint of possible sepsis.  Brought in by EMS, who went to patient's residence twice this morning for calls.  Concern for possible altered mental status, concern for possible sepsis.  Patient endorses recent cough but otherwise has no complaints at this time.  I was unable to obtain an accurate HPI, PMH, or ROS due to the patient's altered mental status.  Level 5 caveat.  Review of Systems  Positive for altered mental status, cough  Patient's Health History    Past Medical History:  Diagnosis Date   AKI (acute kidney injury) (HCC) 02/2019   Atrial fibrillation (HCC)    Cardiomyopathy (HCC)    Cat scratch fever    removed mass on right side neck   Cataracts, bilateral    CHF (congestive heart failure) (HCC)    Chronic kidney disease    Corns and callosities    Diabetes mellitus without complication (HCC)    TYPE 2   Dyspnea    Erythema intertrigo    Flat foot    Gout    High cholesterol    Hx of urethral stricture    Hypertension    Kidney stones    Morbid obesity (HCC)    Nonischemic cardiomyopathy (HCC)    a. ? 2009 Cath in [place] - nl cors per pt;  b. 09/2013 Echo: EF 25-30%, sev diff HK.   Obesity    Osteoarthritis    PAF (paroxysmal atrial fibrillation) (HCC)    a. post-op hip in 2014 - prev on xarelto.   Renal insufficiency     Past Surgical History:  Procedure Laterality Date   CARDIAC CATHETERIZATION  1980's   CARDIOVERSION N/A 01/15/2020   Procedure: CARDIOVERSION;  Surgeon: Laurey Morale, MD;  Location: [facility] ENDOSCOPY;  Service: Cardiovascular;  Laterality: N/A;   CIRCUMCISION  1940's   CORONARY STENT INTERVENTION N/A 08/18/2018    Procedure: CORONARY STENT INTERVENTION;  Surgeon: Corky Crafts, MD;  Location: [facility] INVASIVE CV LAB;  Service: Cardiovascular;  Laterality: N/A;   CYSTOSCOPY WITH URETHRAL DILATATION  10/23/2013   CYSTOSCOPY WITH URETHRAL DILATATION N/A 10/23/2013   Procedure: CYSTOSCOPY WITH URETHRAL DILATATION;  Surgeon: Kathi Ludwig, MD;  Location: [facility] OR;  Service: Urology;  Laterality: N/A;   INCISION AND DRAINAGE OF WOUND Right 1980's   "cat scratch" (10/23/2013)   INGUINAL HERNIA REPAIR Right 1950's   INTRAVASCULAR ULTRASOUND/IVUS N/A 08/18/2018   Procedure: Intravascular Ultrasound/IVUS;  Surgeon: Corky Crafts, MD;  Location: [facility] INVASIVE CV LAB;  Service: Cardiovascular;  Laterality: N/A;   KIDNEY STONE SURGERY Right 1970's; 1983   "twice"   LEFT HEART CATH AND CORONARY ANGIOGRAPHY N/A 08/18/2018   Procedure: LEFT HEART CATH AND CORONARY ANGIOGRAPHY;  Surgeon: Corky Crafts, MD;  Location: [facility] INVASIVE CV LAB;  Service: Cardiovascular;  Laterality: N/A;   RIGHT HEART CATH N/A 02/19/2020   Procedure: RIGHT HEART CATH;  Surgeon: Laurey Morale, MD;  Location: [facility] INVASIVE CV LAB;  Service: Cardiovascular;  Laterality: N/A;   TEE WITHOUT CARDIOVERSION N/A 01/15/2020   Procedure: TRANSESOPHAGEAL ECHOCARDIOGRAM (TEE);  Surgeon: Laurey Morale, MD;  Location: [facility] ENDOSCOPY;  Service: Cardiovascular;  Laterality: N/A;   TONSILLECTOMY AND ADENOIDECTOMY  1940's  TOTAL HIP ARTHROPLASTY Left 10/23/2013   Procedure: TOTAL HIP ARTHROPLASTY;  Surgeon: Valeria Batman, MD;  Location: [facility] OR;  Service: Orthopedics;  Laterality: Left;    Family History  Problem Relation Age of Onset   Heart disease Mother    Heart Problems Maternal Grandmother    Other Other        negative for premature CAD    Social History   Socioeconomic History   Marital status: Married    Spouse name: Not on file   Number of children: Not on file   Years of education: Not on file   Highest  education level: Not on file  Occupational History   Occupation: Retired    Comment: Former LPN  Tobacco Use   Smoking status: Former Smoker    Packs/day: 1.00    Years: 15.00    Pack years: 15.00    Types: Cigarettes, Cigars   Smokeless tobacco: Former Neurosurgeon   Tobacco comment: quit smoking ~ 50 yr ago  Building services engineer Use: Never used  Substance and Sexual Activity   Alcohol use: No    Alcohol/week: 2.0 standard drinks    Types: 2 Cans of beer per week    Comment: rare beer.   Drug use: No   Sexual activity: Yes  Other Topics Concern   Not on file  Social History Narrative   Lives in [place] with wife. Moved from [place] (2013). Does not routinely exercise.   Social Determinants of Health   Financial Resource Strain:    Difficulty of Paying Living Expenses: Not on file  Food Insecurity:    Worried About Programme researcher, broadcasting/film/video in the Last Year: Not on file   [employer] of Food in the Last Year: Not on file  Transportation Needs:    Lack of Transportation (Medical): Not on file   Lack of Transportation (Non-Medical): Not on file  Physical Activity:    Days of Exercise per Week: Not on file   Minutes of Exercise per Session: Not on file  Stress:    Feeling of Stress : Not on file  Social Connections:    Frequency of Communication with Friends and Family: Not on file   Frequency of Social Gatherings with Friends and Family: Not on file   Attends Religious Services: Not on file   Active Member of Clubs or Organizations: Not on file   Attends Banker Meetings: Not on file   Marital Status: Not on file  Intimate Partner Violence:    Fear of Current or Ex-Partner: Not on file   Emotionally Abused: Not on file   Physically Abused: Not on file   Sexually Abused: Not on file     Physical Exam   Vitals:   08/16/20 1545 08/16/20 1620  BP: 107/63 (!) 99/56  Pulse: 89 87  Resp: (!) 21 18  Temp:  97.8 F (36.6 C)  SpO2: 99% 98%     CONSTITUTIONAL: Chronically ill-appearing, NAD NEURO:  Alert and oriented to name only, no focal neurological deficits, normal and symmetric strength and sensation, normal coordination EYES:  eyes equal and reactive ENT/NECK:  no LAD, no JVD CARDIO: Regular rate, well-perfused, normal S1 and S2 PULM:  CTAB no wheezing or rhonchi GI/GU:  normal bowel sounds, non-distended, non-tender MSK/SPINE:  No gross deformities, no edema SKIN:  no rash, atraumatic PSYCH:  Appropriate speech and behavior  *Additional and/or pertinent findings included in MDM below  Diagnostic and Interventional  Summary    EKG Interpretation  Date/Time:  Saturday August 16 2020 09:36:17 EDT Ventricular Rate:  104 PR Interval:    QRS Duration: 117 QT Interval:  389 QTC Calculation: 512 R Axis:   47 Text Interpretation: Sinus or ectopic atrial tachycardia Nonspecific intraventricular conduction delay Borderline low voltage, extremity leads Borderline repolarization abnormality Confirmed by Kennis Carina [phone]) on 08/16/2020 9:43:11 AM      Labs Reviewed  LACTIC ACID, PLASMA - Abnormal; Notable for the following components:      Result Value   Lactic Acid, Venous 4.4 (*)    All other components within normal limits  LACTIC ACID, PLASMA - Abnormal; Notable for the following components:   Lactic Acid, Venous 2.4 (*)    All other components within normal limits  COMPREHENSIVE METABOLIC PANEL - Abnormal; Notable for the following components:   Chloride 97 (*)    CO2 21 (*)    Glucose, Bld 158 (*)    BUN 75 (*)    Creatinine, Ser 2.89 (*)    Albumin 2.2 (*)    AST 82 (*)    GFR calc non Af Amer 19 (*)    GFR calc Af Amer 22 (*)    Anion gap 17 (*)    All other components within normal limits  CBC WITH DIFFERENTIAL/PLATELET - Abnormal; Notable for the following components:   RBC 2.92 (*)    Hemoglobin 8.2 (*)    HCT 26.1 (*)    RDW 25.8 (*)    Lymphs Abs 0.3 (*)    All other components within  normal limits  PROTIME-INR - Abnormal; Notable for the following components:   Prothrombin Time 20.4 (*)    INR 1.8 (*)    All other components within normal limits  APTT - Abnormal; Notable for the following components:   aPTT 56 (*)    All other components within normal limits  URINALYSIS, ROUTINE W REFLEX MICROSCOPIC - Abnormal; Notable for the following components:   APPearance CLOUDY (*)    Hgb urine dipstick LARGE (*)    Protein, ur 30 (*)    Leukocytes,Ua LARGE (*)    RBC / HPF >50 (*)    WBC, UA >50 (*)    Bacteria, UA FEW (*)    Non Squamous Epithelial 0-5 (*)    All other components within normal limits  CK - Abnormal; Notable for the following components:   Total CK 732 (*)    All other components within normal limits  CBG MONITORING, ED - Abnormal; Notable for the following components:   Glucose-Capillary 162 (*)    All other components within normal limits  TROPONIN I (HIGH SENSITIVITY) - Abnormal; Notable for the following components:   Troponin I (High Sensitivity) 732 (*)    All other components within normal limits  TROPONIN I (HIGH SENSITIVITY) - Abnormal; Notable for the following components:   Troponin I (High Sensitivity) 1,147 (*)    All other components within normal limits  SARS CORONAVIRUS 2 BY RT PCR (HOSPITAL ORDER, PERFORMED IN  HOSPITAL LAB)  CULTURE, BLOOD (SINGLE)  URINE CULTURE  CULTURE, BLOOD (SINGLE)  URINE CULTURE  CULTURE, BLOOD (ROUTINE X 2)  CBC  CREATININE, SERUM  MAGNESIUM  PHOSPHORUS  PROCALCITONIN  TSH  TROPONIN I (HIGH SENSITIVITY)  TROPONIN I (HIGH SENSITIVITY)    DG Chest Port 1 View  Final Result    CT Head Wo Contrast  Final Result      Medications  cefTRIAXone (  ROCEPHIN) 2 g in sodium chloride 0.9 % 100 mL IVPB (has no administration in time range)  azithromycin (ZITHROMAX) 500 mg in sodium chloride 0.9 % 250 mL IVPB (has no administration in time range)  acetaminophen (TYLENOL) tablet 650 mg (has no  administration in time range)    Or  acetaminophen (TYLENOL) suppository 650 mg (has no administration in time range)  ondansetron (ZOFRAN) tablet 4 mg (has no administration in time range)    Or  ondansetron (ZOFRAN) injection 4 mg (has no administration in time range)  amiodarone (PACERONE) tablet 200 mg (has no administration in time range)  rosuvastatin (CRESTOR) tablet 10 mg (has no administration in time range)  sertraline (ZOLOFT) tablet 25 mg (has no administration in time range)  levothyroxine (SYNTHROID) tablet 125 mcg (has no administration in time range)  polyethylene glycol (MIRALAX / GLYCOLAX) packet 17 g (has no administration in time range)  senna-docusate (Senokot-S) tablet 1 tablet (has no administration in time range)  apixaban (ELIQUIS) tablet 2.5 mg (has no administration in time range)  ipratropium-albuterol (DUONEB) 0.5-2.5 (3) MG/3ML nebulizer solution 3 mL (has no administration in time range)  sodium chloride 0.9 % bolus 1,000 mL (1,000 mLs Intravenous New Bag/Given 08/16/20 1259)  cefTRIAXone (ROCEPHIN) 2 g in sodium chloride 0.9 % 100 mL IVPB (0 g Intravenous Stopped 08/16/20 1155)  sodium chloride 0.9 % bolus 1,000 mL (0 mLs Intravenous Stopped 08/16/20 1258)  azithromycin (ZITHROMAX) 500 mg in sodium chloride 0.9 % 250 mL IVPB (0 mg Intravenous Stopped 08/16/20 1416)  sodium chloride 0.9 % bolus 500 mL (0 mLs Intravenous Stopped 08/16/20 1504)     Procedures  /  Critical Care .Critical Care Performed by: Sabas Sous, MD Authorized by: Sabas Sous, MD   Critical care provider statement:    Critical care time (minutes):  45   Critical care was necessary to treat or prevent imminent or life-threatening deterioration of the following conditions:  Sepsis   Critical care was time spent personally by me on the following activities:  Discussions with consultants, evaluation of patient's response to treatment, examination of patient, ordering and performing  treatments and interventions, ordering and review of laboratory studies, ordering and review of radiographic studies, pulse oximetry, re-evaluation of patient's condition, obtaining history from patient or surrogate and review of old charts    ED Course and Medical Decision Making  I have reviewed the triage vital signs, the nursing notes, and pertinent available records from the EMR.  Listed above are laboratory and imaging tests that I personally ordered, reviewed, and interpreted and then considered in my medical decision making (see below for details).  Concern for sepsis, metabolic disarray, UTI, less likely stroke.  Also called to bedside for possible facial droop and left-sided weakness but on my exam no obvious focal deficits and the nurse who had initial concerns agrees that the exam is now improved and possibly did not get much effort of the patient during her initial assessment.  Still will obtain CT head given the possible confusion, and will evaluate for possible infectious etiology.  Currently mildly tachycardic, normotensive, will obtain rectal temperature.     Urinalysis with evidence of infection, labs revealing acute kidney injury, troponin elevation, elevated lactate.  Lactate and blood pressure improving with fluids, admitted to stepdown unit for further care.  Elmer Sow. Pilar Plate, MD [facility] Emergency Medicine [facility] [email or web address]  Final Clinical Impressions(s) / ED Diagnoses     ICD-10-CM   1. Sepsis,  due to unspecified organism, unspecified whether acute organ dysfunction present [facility])  A41.9     ED Discharge Orders    None       Discharge Instructions Discussed with and Provided to Patient:   Discharge Instructions   None       Sabas Sous, MD 08/16/20 1640

## 2020-08-16 NOTE — ED Notes (Signed)
ONE blood culture drawn

## 2020-08-16 NOTE — H&P (Addendum)
History and Physical    Ryan Macdonald QQV:[id number] DOB: 06-01-1934 DOA: 08/16/2020  PCP: [facility]  Patient coming from: home I have personally briefly reviewed patient's old medical records in [facility]  Chief Complaint: " I fell this morning"  HPI: Ryan Macdonald is a 86 y.o. male with medical history significant of paroxysmal A. fib-on Eliquis, PE, type 2 diabetes mellitus, hypertension, hyperlipidemia, nonischemic cardiomyopathy with ejection fraction of 20 to 25% status post AICD placement, CKD stage III, gout, obesity, hypothyroidism brought by EMS for evaluation of fall and sepsis.  Patient tells me that he was trying to get out of bed and he fell however he denies any head trauma, pain, loss of consciousness, seizures, headache, blurry vision, lightheadedness or dizziness.  No history of chest pain, shortness of breath, palpitation, has chronic leg swelling however denies orthopnea, PND, fever, chills, cough, congestion, nausea, vomiting, abdominal pain, urinary symptoms including dysuria, hematuria, foul-smelling urine, lower back pain, abdominal pain, change in urinary frequency.  He tells me that he is compliant with his home medication.  He lives with his wife at home.  No history of smoking, alcohol, illicit drug use.  ED Course: Upon arrival to ED: Patient tachycardic, tachypneic, blood pressure soft, afebrile with no leukocytosis, CBC shows anemia of chronic disease-stable, CMP shows worsening kidney function, anion gap of 17, troponin 732 trended up to 1147, EKG shows sinus or ectopic atrial tachycardia, nonspecific IV conduction delay.  CK: 732, lactic acid 4.4 trended down to 2.4, UA positive for leukocyte, RBC, WBC and bacteria, COVID-19 negative, UC, BC: Pending, chest x-ray shows cardiomegaly with pulmonary edema and small right pleural effusion.  Persistent heterogeneous opacity of right lung base, differential includes infectious versus atelectasis.   CT head negative for acute findings.  Patient was given IV fluids x2, Rocephin and azithromycin in ED.  Transfer hospitalist consulted for admission for sepsis secondary to pneumonia versus UTI.  Review of Systems: As per HPI otherwise negative.    Past Medical History:  Diagnosis Date  . AKI (acute kidney injury) (HCC) 02/2019  . Atrial fibrillation (HCC)   . Cardiomyopathy (HCC)   . Cat scratch fever    removed mass on right side neck  . Cataracts, bilateral   . CHF (congestive heart failure) (HCC)   . Chronic kidney disease   . Corns and callosities   . Diabetes mellitus without complication (HCC)    TYPE 2  . Dyspnea   . Erythema intertrigo   . Flat foot   . Gout   . High cholesterol   . Hx of urethral stricture   . Hypertension   . Kidney stones   . Morbid obesity (HCC)   . Nonischemic cardiomyopathy (HCC)    a. ? 2009 Cath in [place] - nl cors per pt;  b. 09/2013 Echo: EF 25-30%, sev diff HK.  . Obesity   . Osteoarthritis   . PAF (paroxysmal atrial fibrillation) (HCC)    a. post-op hip in 2014 - prev on xarelto.  . Renal insufficiency     Past Surgical History:  Procedure Laterality Date  . CARDIAC CATHETERIZATION  1980's  . CARDIOVERSION N/A 01/15/2020   Procedure: CARDIOVERSION;  Surgeon: Laurey Morale, MD;  Location: [facility] ENDOSCOPY;  Service: Cardiovascular;  Laterality: N/A;  . CIRCUMCISION  1940's  . CORONARY STENT INTERVENTION N/A 08/18/2018   Procedure: CORONARY STENT INTERVENTION;  Surgeon: Corky Crafts, MD;  Location: [facility] INVASIVE CV LAB;  Service: Cardiovascular;  Laterality: N/A;  . CYSTOSCOPY WITH URETHRAL DILATATION  10/23/2013  . CYSTOSCOPY WITH URETHRAL DILATATION N/A 10/23/2013   Procedure: CYSTOSCOPY WITH URETHRAL DILATATION;  Surgeon: Kathi Ludwig, MD;  Location: [facility] OR;  Service: Urology;  Laterality: N/A;  . INCISION AND DRAINAGE OF WOUND Right 1980's   "cat scratch" (10/23/2013)  . INGUINAL HERNIA REPAIR Right 1950's  .  INTRAVASCULAR ULTRASOUND/IVUS N/A 08/18/2018   Procedure: Intravascular Ultrasound/IVUS;  Surgeon: Corky Crafts, MD;  Location: [facility] INVASIVE CV LAB;  Service: Cardiovascular;  Laterality: N/A;  . KIDNEY STONE SURGERY Right 1970's; 1983   "twice"  . LEFT HEART CATH AND CORONARY ANGIOGRAPHY N/A 08/18/2018   Procedure: LEFT HEART CATH AND CORONARY ANGIOGRAPHY;  Surgeon: Corky Crafts, MD;  Location: [facility] INVASIVE CV LAB;  Service: Cardiovascular;  Laterality: N/A;  . RIGHT HEART CATH N/A 02/19/2020   Procedure: RIGHT HEART CATH;  Surgeon: Laurey Morale, MD;  Location: [facility] INVASIVE CV LAB;  Service: Cardiovascular;  Laterality: N/A;  . TEE WITHOUT CARDIOVERSION N/A 01/15/2020   Procedure: TRANSESOPHAGEAL ECHOCARDIOGRAM (TEE);  Surgeon: Laurey Morale, MD;  Location: [facility] ENDOSCOPY;  Service: Cardiovascular;  Laterality: N/A;  . TONSILLECTOMY AND ADENOIDECTOMY  1940's  . TOTAL HIP ARTHROPLASTY Left 10/23/2013   Procedure: TOTAL HIP ARTHROPLASTY;  Surgeon: Valeria Batman, MD;  Location: [facility] OR;  Service: Orthopedics;  Laterality: Left;     reports that he has quit smoking. His smoking use included cigarettes and cigars. He has a 15.00 pack-year smoking history. He has quit using smokeless tobacco. He reports that he does not drink alcohol and does not use drugs.  Allergies  Allergen Reactions  . Lisinopril Swelling    Facial swelling  . Xarelto [Rivaroxaban] Other (See Comments)    "Blood came out of my penis"    Family History  Problem Relation Age of Onset  . Heart disease Mother   . Heart Problems Maternal Grandmother   . Other Other        negative for premature CAD    Prior to Admission medications   Medication Sig Start Date End Date Taking? Authorizing Provider  allopurinol (ZYLOPRIM) 100 MG tablet Take 200 mg by mouth daily.     [provider]  amiodarone (PACERONE) 200 MG tablet Take 1 tablet (200 mg total) by mouth daily. 05/29/20   Laurey Morale, MD    apixaban (ELIQUIS) 2.5 MG TABS tablet Take 1 tablet (2.5 mg total) by mouth 2 (two) times daily. 03/27/20   Laurey Morale, MD  cyanocobalamin 1000 MCG tablet Take 1 tablet (1,000 mcg total) by mouth daily. 03/13/19   Laurey Morale, MD  diclofenac Sodium (VOLTAREN) 1 % GEL Apply 4 g topically 4 (four) times daily. 02/24/20   Marguerita Merles Latif, DO  finasteride (PROSCAR) 5 MG tablet Take 1 tablet (5 mg total) by mouth daily. 11/29/18   Alford Highland, NP  hydrALAZINE (APRESOLINE) 25 MG tablet Take 0.5 tablets (12.5 mg total) by mouth every 8 (eight) hours. 02/24/20   Sheikh, Omair Latif, DO  ipratropium-albuterol (DUONEB) 0.5-2.5 (3) MG/3ML SOLN Take 3 mLs by nebulization every 4 (four) hours as needed. Patient not taking: Reported on 08/13/2020 05/14/17   Pearson Grippe, MD  isosorbide dinitrate (ISORDIL) 10 MG tablet Take 10 mg by mouth 3 (three) times daily.    [provider]  levothyroxine (SYNTHROID) 125 MCG tablet Take 1 tablet (125 mcg total) by mouth daily before breakfast. 08/01/19   Carlus Pavlov, MD  magnesium oxide (MAG-OX) 400 MG tablet Take 400 mg by mouth daily.     [provider]  metolazone (ZAROXOLYN) 2.5 MG tablet Take 1 tablet (2.5 mg total) by mouth once a week. On wednesdays 08/08/20 11/06/20  Laurey Morale, MD  polyethylene glycol (MIRALAX / GLYCOLAX) 17 g packet Take 17 g by mouth 2 (two) times daily. 02/24/20   Marguerita Merles Latif, DO  potassium chloride SA (KLOR-CON) 20 MEQ tablet Take 1 tablet (20 mEq total) by mouth once a week. Only on metolazone days 08/13/20   Robbie Lis M, PA-C  rosuvastatin (CRESTOR) 10 MG tablet Take 1 tablet (10 mg total) by mouth daily at 6 PM. 12/22/18   [name], MD  senna-docusate (SENOKOT-S) 8.6-50 MG tablet Take 1 tablet by mouth 2 (two) times daily. 02/24/20   Marguerita Merles Latif, DO  sertraline (ZOLOFT) 25 MG tablet Take 25 mg by mouth daily.     [provider]  spironolactone (ALDACTONE) 25 MG  tablet Take 12.5 mg by mouth at bedtime.    [provider]  tamsulosin (FLOMAX) 0.4 MG CAPS capsule Take 0.4 mg by mouth at bedtime.    [provider]  torsemide (DEMADEX) 20 MG tablet Take 4 tablets (80 mg total) by mouth in the morning AND 2 tablets (40 mg total) every evening. 08/08/20   Laurey Morale, MD    Physical Exam: Vitals:   08/16/20 1130 08/16/20 1145 08/16/20 1200 08/16/20 1300  BP: 117/73 116/69 110/76 102/65  Pulse: (!) 107 (!) 110 (!) 103 (!) 105  Resp: (!) 21 20 (!) 24 (!) 28  Temp:      TempSrc:      SpO2: 95% 99% 99% 99%  Weight:      Height:        Constitutional: NAD, calm, comfortable, on room air, communicating well, appears dehydrated Eyes: PERRL, lids and conjunctivae normal ENMT: Mucous membranes are dry. Posterior pharynx clear of any exudate or lesions.Normal dentition.  Neck: normal, supple, no masses, no thyromegaly Respiratory: clear to auscultation bilaterally, no wheezing, no crackles. Normal respiratory effort. No accessory muscle use.  Cardiovascular: Regular rate and rhythm, no murmurs / rubs / gallops.  Bilateral 3+ pitting edema positive. 2+ pedal pulses. No carotid bruits.  Abdomen: no tenderness, no masses palpated. No hepatosplenomegaly. Bowel sounds positive.  Musculoskeletal: no clubbing / cyanosis. No joint deformity upper and lower extremities. Good ROM, no contractures. Normal muscle tone.  Skin: no rashes, lesions, ulcers. No induration Neurologic: CN 2-12 grossly intact. Sensation intact, DTR normal. Strength 5/5 in all 4.  Psychiatric: Normal judgment and insight. Alert and oriented x 3. Normal mood.    Labs on Admission: I have personally reviewed following labs and imaging studies  CBC: Recent Labs  Lab 08/16/20 0948  WBC 7.3  NEUTROABS 6.0  HGB 8.2*  HCT 26.1*  MCV 89.4  PLT 254   Basic Metabolic Panel: Recent Labs  Lab 08/13/20 1022 08/16/20 0948  NA 136 135  K 3.6 3.5  CL 98 97*  CO2 25 21*   GLUCOSE 115* 158*  BUN 49* 75*  CREATININE 2.14* 2.89*  CALCIUM 9.2 9.0   GFR: Estimated Creatinine Clearance: 20.2 mL/min (A) (by C-G formula based on SCr of 2.89 mg/dL (H)). Liver Function Tests: Recent Labs  Lab 08/16/20 0948  AST 82*  ALT 27  ALKPHOS 76  BILITOT 1.0  PROT 7.8  ALBUMIN 2.2*   No results for input(s): LIPASE, AMYLASE in the  last 168 hours. No results for input(s): AMMONIA in the last 168 hours. Coagulation Profile: Recent Labs  Lab 08/16/20 0948  INR 1.8*   Cardiac Enzymes: Recent Labs  Lab 08/16/20 1150  CKTOTAL 732*   BNP (last 3 results) No results for input(s): PROBNP in the last 8760 hours. HbA1C: No results for input(s): HGBA1C in the last 72 hours. CBG: Recent Labs  Lab 08/16/20 0942  GLUCAP 162*   Lipid Profile: No results for input(s): CHOL, HDL, LDLCALC, TRIG, CHOLHDL, LDLDIRECT in the last 72 hours. Thyroid Function Tests: No results for input(s): TSH, T4TOTAL, FREET4, T3FREE, THYROIDAB in the last 72 hours. Anemia Panel: No results for input(s): VITAMINB12, FOLATE, FERRITIN, TIBC, IRON, RETICCTPCT in the last 72 hours. Urine analysis:    Component Value Date/Time   COLORURINE YELLOW 08/16/2020 1240   APPEARANCEUR CLOUDY (A) 08/16/2020 1240   LABSPEC 1.009 08/16/2020 1240   PHURINE 6.0 08/16/2020 1240   GLUCOSEU NEGATIVE 08/16/2020 1240   HGBUR LARGE (A) 08/16/2020 1240   BILIRUBINUR NEGATIVE 08/16/2020 1240   KETONESUR NEGATIVE 08/16/2020 1240   PROTEINUR 30 (A) 08/16/2020 1240   UROBILINOGEN 0.2 05/26/2015 1521   NITRITE NEGATIVE 08/16/2020 1240   LEUKOCYTESUR LARGE (A) 08/16/2020 1240    Radiological Exams on Admission: CT Head Wo Contrast  Result Date: 08/16/2020 CLINICAL DATA:  Delirium and fall EXAM: CT HEAD WITHOUT CONTRAST TECHNIQUE: Contiguous axial images were obtained from the base of the skull through the vertex without intravenous contrast. COMPARISON:  None. FINDINGS: Brain: No evidence of acute  infarction, hemorrhage, hydrocephalus, extra-axial collection or mass lesion/mass effect. Periventricular white matter hypodensities consistent with sequela of chronic microvascular ischemic disease. Vascular: Vascular calcifications. Skull: Normal. Negative for fracture or focal lesion. Sinuses/Orbits: No acute finding. Other: None. IMPRESSION: 1.  No acute intracranial abnormality. Electronically Signed   By: Meda Klinefelter MD   On: 08/16/2020 11:01   DG Chest Port 1 View  Result Date: 08/16/2020 CLINICAL DATA:  Delirium EXAM: PORTABLE CHEST 1 VIEW COMPARISON:  August 08, 2020 FINDINGS: The cardiomediastinal silhouette is unchanged and enlarged in contour.Evaluation is limited secondary to patient rotation. LEFT chest AICD. Small RIGHT pleural effusion. No pneumothorax. Perihilar vascular fullness with diffuse interstitial opacities, similar comparison to prior. Persistent heterogeneous opacity of the RIGHT lung base. Visualized abdomen is unremarkable. Multilevel degenerative changes of the thoracic spine. IMPRESSION: 1. Cardiomegaly with pulmonary edema and small RIGHT pleural effusion. 2. Persistent heterogeneous opacity of the RIGHT lung base. Differential considerations include infection versus atelectasis. Electronically Signed   By: Meda Klinefelter MD   On: 08/16/2020 11:03    EKG: Independently reviewed.  Sinus or ectopic atrial tachycardia, nonspecific IV conduction delay, no acute ST elevation or depression noted.  Assessment/Plan Principal Problem:   Sepsis (HCC) Active Problems:   Nonischemic cardiomyopathy (HCC)   Obesity (BMI 30-39.9)   Essential hypertension   Hyperlipidemia   PAF (paroxysmal atrial fibrillation) (HCC)   ICD (implantable cardioverter-defibrillator) in place   Depression   Gout   Diabetes mellitus type 2 in obese (HCC)   Lactic acidosis   Elevated troponin   Acute on chronic kidney failure (HCC)   Anemia of chronic disease   Severe  sepsis: -Secondary to likely UTI vs ?Right sided pneumonia -Patient presented with tachycardia, tachypnea, blood pressure soft, lactic acid of 4.4, AKI on CKD stage III.  COVID-19 negative.  CT head negative for acute findings.  Maintaining oxygen saturation on room air. -Reviewed chest x-ray.  UA positive for leukocyte/RBC/WBC/bacteria  however patient denies any urinary symptoms (has history of E. coli UTI with hematuria). -Patient received IV fluid bolus Rocephin and azithromycin in ED.  Urine culture and blood culture is obtained and is pending. -Admit patient stepdown unit for close monitoring.  On continuous pulse ox. -Continue Rocephin and azithromycin.  Check procalcitonin level.  Check urine Legionella and strep antigen.  Monitor vitals closely.  Fall: Could be polypharmacy versus orthostatic hypotension (he takes tamsulosin and finasteride) versus dehydration and sepsis -CT head is negative. -Check electrolytes.  On fall precautions -Consult PT/OT  Elevated troponin: -Likely demand ischemia secondary to underlying sepsis - Troponin trended up from [phone].  Reviewed EKG.  Trend troponin.  Patient denies any ACS symptoms. -EDP consulted cardiology-recommend trend troponin and call cardiology if any concerns.  Nonischemic cardiomyopathy status post AICD placement/ chronic systolic CHF: With ejection fraction of 20 to 25%  -Patient has chronic bilateral lower extremity swelling.  Denies shortness of breath, orthopnea or PND.  Reviewed chest x-ray. -Strict INO's and daily weight.  Monitor signs of fluid overload. -Check electrolytes. -Hold diuretics as patient's blood pressure is on lower side and AKI.  Acute on chronic kidney disease stage IIIb/anion gap metabolic acidosis: -BUN: 75, creatinine: 2.89, GFR: 22 (baseline creatinine: 1.99, GFR: 30) anion gap of 17. -Received IV fluid bolus in ED.  Continue with gentle hydration.  Avoid nephrotoxic medication.  Repeat BMP tomorrow  a.m.  Paroxysmal A. fib: On Eliquis -Reviewed EKG.  Continue Eliquis and amiodarone. -On telemetry.  Check magnesium level.  Hypothyroidism: Check TSH -Continue levothyroxine.  Hypertension: Blood pressure is on lower side -Received IV fluid bolus in ED. -Hold home BP meds hydralazine, Imdur, Aldactone.  Monitor blood pressure closely.  Resume home BP meds once blood pressure is back to baseline  Normocytic anemia/anemia of chronic disease: H&H is currently stable.  No signs of active bleeding. -Monitor H&H and transfuse as needed.  Type 2 diabetes mellitus: -Hold glipizide.  Started patient on sliding scale insulin and monitor blood sugar closely.  Check A1c  Hyperlipidemia: Continue statin  Coronary artery disease: Status post PCI in 2019: Stable -Patient denies ACS symptoms. -Continue statin, hold Imdur due to hypotension.  Depression: Cont. Zoloft  Gout: Hold allopurinol due to AKI  BPH: Hold tamsulosin and finasteride due to hypotension  Please note: Patient's third troponin trended up.  Patient is asymptomatic.  No ACS symptoms.  I called cardiology again and discussed with Dr. Ashley Royalty who said that patient's elevated troponin is likely secondary to demand ischemia due to decreased perfusion/lactic acidosis/worsening kidney function and sepsis.  No indication of immediate cardiac evaluation at this time.  DVT prophylaxis: Eliquis/SCD Code Status: Full code-confirmed with the patient Family Communication: None present at bedside.  Plan of care discussed with patient in length and he verbalized understanding and agreed with it. Disposition Plan: Home in 2 to 3 days Consults called: Cardiology by EDP Admission status: Inpatient   Ollen Bowl MD [facility]  If 7PM-7AM, please contact night-coverage [email or web address] Password [email or web address]  08/16/2020, 1:45 PM

## 2020-08-17 ENCOUNTER — Inpatient Hospital Stay (HOSPITAL_COMMUNITY): Payer: Non-veteran care

## 2020-08-17 DIAGNOSIS — N1832 Chronic kidney disease, stage 3b: Secondary | ICD-10-CM

## 2020-08-17 DIAGNOSIS — R778 Other specified abnormalities of plasma proteins: Secondary | ICD-10-CM

## 2020-08-17 DIAGNOSIS — I428 Other cardiomyopathies: Secondary | ICD-10-CM

## 2020-08-17 DIAGNOSIS — Z95810 Presence of automatic (implantable) cardiac defibrillator: Secondary | ICD-10-CM

## 2020-08-17 DIAGNOSIS — E7800 Pure hypercholesterolemia, unspecified: Secondary | ICD-10-CM

## 2020-08-17 DIAGNOSIS — I350 Nonrheumatic aortic (valve) stenosis: Secondary | ICD-10-CM

## 2020-08-17 DIAGNOSIS — I480 Paroxysmal atrial fibrillation: Secondary | ICD-10-CM

## 2020-08-17 DIAGNOSIS — I10 Essential (primary) hypertension: Secondary | ICD-10-CM

## 2020-08-17 DIAGNOSIS — I340 Nonrheumatic mitral (valve) insufficiency: Secondary | ICD-10-CM

## 2020-08-17 DIAGNOSIS — N170 Acute kidney failure with tubular necrosis: Secondary | ICD-10-CM

## 2020-08-17 LAB — ECHOCARDIOGRAM COMPLETE
AR max vel: 1.48 cm2
AV Area VTI: 1.27 cm2
AV Area mean vel: 1.38 cm2
AV Mean grad: 6 mmHg
AV Peak grad: 12 mmHg
Ao pk vel: 1.73 m/s
Height: 67 in
MV M vel: 3.83 m/s
MV Peak grad: 58.7 mmHg
S' Lateral: 6.6 cm
Single Plane A4C EF: 32.1 %
Weight: 3114.66 oz

## 2020-08-17 LAB — CBC
HCT: 25.6 % — ABNORMAL LOW (ref 39.0–52.0)
Hemoglobin: 7.7 g/dL — ABNORMAL LOW (ref 13.0–17.0)
MCH: 27 pg (ref 26.0–34.0)
MCHC: 30.1 g/dL (ref 30.0–36.0)
MCV: 89.8 fL (ref 80.0–100.0)
Platelets: 214 10*3/uL (ref 150–400)
RBC: 2.85 MIL/uL — ABNORMAL LOW (ref 4.22–5.81)
RDW: 25.9 % — ABNORMAL HIGH (ref 11.5–15.5)
WBC: 7.1 10*3/uL (ref 4.0–10.5)
nRBC: 0 % (ref 0.0–0.2)

## 2020-08-17 LAB — COMPREHENSIVE METABOLIC PANEL
ALT: 27 U/L (ref 0–44)
AST: 94 U/L — ABNORMAL HIGH (ref 15–41)
Albumin: 1.9 g/dL — ABNORMAL LOW (ref 3.5–5.0)
Alkaline Phosphatase: 63 U/L (ref 38–126)
Anion gap: 10 (ref 5–15)
BUN: 62 mg/dL — ABNORMAL HIGH (ref 8–23)
CO2: 25 mmol/L (ref 22–32)
Calcium: 8.6 mg/dL — ABNORMAL LOW (ref 8.9–10.3)
Chloride: 104 mmol/L (ref 98–111)
Creatinine, Ser: 2.17 mg/dL — ABNORMAL HIGH (ref 0.61–1.24)
GFR calc Af Amer: 31 mL/min — ABNORMAL LOW (ref >60)
GFR calc non Af Amer: 27 mL/min — ABNORMAL LOW (ref >60)
Glucose, Bld: 94 mg/dL (ref 70–99)
Potassium: 3.2 mmol/L — ABNORMAL LOW (ref 3.5–5.1)
Sodium: 139 mmol/L (ref 135–145)
Total Bilirubin: 1.1 mg/dL (ref 0.3–1.2)
Total Protein: 7.1 g/dL (ref 6.5–8.1)

## 2020-08-17 LAB — PROTIME-INR
INR: 1.8 — ABNORMAL HIGH (ref 0.8–1.2)
Prothrombin Time: 20 seconds — ABNORMAL HIGH (ref 11.4–15.2)

## 2020-08-17 LAB — GLUCOSE, CAPILLARY: Glucose-Capillary: 101 mg/dL — ABNORMAL HIGH (ref 70–99)

## 2020-08-17 LAB — BRAIN NATRIURETIC PEPTIDE: B Natriuretic Peptide: 2349.4 pg/mL — ABNORMAL HIGH (ref 0.0–100.0)

## 2020-08-17 MED ORDER — PERFLUTREN LIPID MICROSPHERE
1.0000 mL | INTRAVENOUS | Status: AC | PRN
  Administered 2020-08-17: 4 mL via INTRAVENOUS
  Filled 2020-08-17: qty 10

## 2020-08-17 MED ORDER — POTASSIUM CHLORIDE CRYS ER 20 MEQ PO TBCR
40.0000 meq | EXTENDED_RELEASE_TABLET | Freq: Once | ORAL | Status: AC
  Administered 2020-08-17: 40 meq via ORAL
  Filled 2020-08-17: qty 2

## 2020-08-17 NOTE — Consult Note (Addendum)
Cardiology Consultation:   Patient ID: Ryan Macdonald MRN: [id number]; DOB: 12-22-1933  Admit date: 08/16/2020 Date of Consult: 08/17/2020  Primary Care Provider: [facility] [place] Cardiologist: Skeet Latch, MD / Loralie Champagne, MD [place] Electrophysiologist:  None    Patient Profile:   Ryan Macdonald is a 86 y.o. male with a history of CAD s/p DES to proximal LAD in 07/2018 with residual non-obstructive disease, chronic systolic CHF/non-ischemic cardiomyopathy with EF of 25% on most recent Echo in 02/2020, s/p ICD for primary prevention, atrial fibrillation on Eliquis, chronic hypoxic respiratory failure on home O2 PRN, hypertension, type 2 diabetes mellitus, and gout  who is being seen today for the evaluation of elevated troponin in the setting of sepsis at the request of Dr. Doristine Bosworth.  History of Present Illness:   Ryan Macdonald is a 86 year old male with the above history who if followed by Dr. Oval Linsey and Dr. Aundra Dubin.  Patient admitted in 10/2018 with acute on chronic diastolic CHF. EF was 20-25% at that time. He was diuresed 21 lbs and mediations were adjusted. EP was consulted for RV pacing and ICD settings were reprogrammed to VDD. Patient was readmitted in 01/2020 with CHF, AKI, and E. Coli UTI. He had a prolonged admission and was diuresed with Milrinone drip and Lasix drip. Amiodarone was restarted at that time to keep him in normal sinus rhythm.   Most recent Echo in 02/2020 showed LVEF of 25% with diffuse hypokinesis of basal to mid inferior and inferolateral walls and grade 2 diastolic dysfunction. RV was normal in size with mildly reduced systolic function. Moderate MR also noted.  Patient was recently admitted to a hospital in [place] for CHF (unable to see records at this time). He was seen by Dr. Aundra Dubin for follow-up on 08/08/2020 at which time he was up 10 lbs from discharge. He noted worsening lower extremity edema and orthopnea. He denied any  dyspnea with walking around the house but is not very active. He was not sure if he was taking his Torsemide 66m twice daily. Torsemide was continued and he was started on Metolazone. Chest x-ray was ordered and showed interval development of small to moderate right pleural effusion with cardiomegaly and pulmonary vascular congestion consistent with mild edema. He was seen again by BLyda Jester PA-C, on 08/13/2020 at which time weight was down 8lbs since visit last week. ReDs Clip 29%.  Patient presented to the ED yesterday via EMS for further evaluation of altered mental status/weakness with fall. BP was reportedly 80/33 upon EMS arrival and he was given 100 mL of IV fluid.  Upon arrival to the ED, patient tachycardic and tachypneic with soft BP. Afebrile. Chest x-ray showed cardiomegaly with pulmonary edema and small right pleural effusion with persistent heterogeneous opacity of the right lung base concerning for infection vs atelectasis. High-sensitivity troponin elevated at 732 >> 1,147 >> 1,743. WBC 7.3, Hgb 8.2, Plts 254. Na 135, K 3.5, Glucose 158, BUN 75, Cr 2.89. Albumin 2.2, AST 82, ALT 27, Alk Phos 76, Total Bili 1.0. Lactic acid 4.4. COVID-19 negative.  At the time of this, evaluation patient sleeping comfortably. He is alert and oriented. He was able to tell me that he came to the hospital because he fell getting out of bed. Denied any prodromal symptoms including palpitations, lightheadedness, dizziness, near syncope, chest pain, or shortness of breath. He states he has been doing well from a cardiac standpoint. He is taking all his medications as directed and  weighing himself daily. Denies any weight gain. Lower extremity edema is stable. He states his breathing has been "good" and does not describe any orthopnea or PND. No recent fevers, chills, or respiratory symptoms. No abnormal bleeding in urine or stools.  Past Medical History:  Diagnosis Date  . AKI (acute kidney injury) ([place])  02/2019  . Atrial fibrillation ([place])   . Cardiomyopathy ([place])   . Cat scratch fever    removed mass on right side neck  . Cataracts, bilateral   . CHF (congestive heart failure) ([place])   . Chronic kidney disease   . Corns and callosities   . Diabetes mellitus without complication ([place])    TYPE 2  . Dyspnea   . Erythema intertrigo   . Flat foot   . Gout   . High cholesterol   . Hx of urethral stricture   . Hypertension   . Kidney stones   . Morbid obesity ([place])   . Nonischemic cardiomyopathy ([place])    a. ? 2009 Cath in [place] - nl cors per pt;  b. 09/2013 Echo: EF 25-30%, sev diff HK.  . Obesity   . Osteoarthritis   . PAF (paroxysmal atrial fibrillation) ([place])    a. post-op hip in 2014 - prev on xarelto.  . Renal insufficiency     Past Surgical History:  Procedure Laterality Date  . CARDIAC CATHETERIZATION  1980's  . CARDIOVERSION N/A 01/15/2020   Procedure: CARDIOVERSION;  Surgeon: Larey Dresser, MD;  Location: [facility] ENDOSCOPY;  Service: Cardiovascular;  Laterality: N/A;  . CIRCUMCISION  1940's  . CORONARY STENT INTERVENTION N/A 08/18/2018   Procedure: CORONARY STENT INTERVENTION;  Surgeon: Jettie Booze, MD;  Location: [place] CV LAB;  Service: Cardiovascular;  Laterality: N/A;  . CYSTOSCOPY WITH URETHRAL DILATATION  10/23/2013  . CYSTOSCOPY WITH URETHRAL DILATATION N/A 10/23/2013   Procedure: CYSTOSCOPY WITH URETHRAL DILATATION;  Surgeon: Ailene Rud, MD;  Location: [place];  Service: Urology;  Laterality: N/A;  . INCISION AND DRAINAGE OF WOUND Right 1980's   "cat scratch" (10/23/2013)  . INGUINAL HERNIA REPAIR Right 1950's  . INTRAVASCULAR ULTRASOUND/IVUS N/A 08/18/2018   Procedure: Intravascular Ultrasound/IVUS;  Surgeon: Jettie Booze, MD;  Location: [place] CV LAB;  Service: Cardiovascular;  Laterality: N/A;  . KIDNEY STONE SURGERY Right 1970's; 1983   "twice"  . LEFT HEART CATH AND CORONARY ANGIOGRAPHY N/A 08/18/2018   Procedure: LEFT HEART  CATH AND CORONARY ANGIOGRAPHY;  Surgeon: Jettie Booze, MD;  Location: [place] CV LAB;  Service: Cardiovascular;  Laterality: N/A;  . RIGHT HEART CATH N/A 02/19/2020   Procedure: RIGHT HEART CATH;  Surgeon: Larey Dresser, MD;  Location: [place] [facility] LAB;  Service: Cardiovascular;  Laterality: N/A;  . TEE WITHOUT CARDIOVERSION N/A 01/15/2020   Procedure: TRANSESOPHAGEAL ECHOCARDIOGRAM (TEE);  Surgeon: Larey Dresser, MD;  Location[facility] ENDOSCOPY;  Service: Cardiovascular;  Laterality: N/A;  . TONSILLECTOMY AND ADENOIDECTOMY  1940's  . TOTAL HIP ARTHROPLASTY Left 10/23/2013   Procedure: TOTAL HIP ARTHROPLASTY;  Surgeon: Garald Balding, MD;  Location: [place];  Service: Orthopedics;  Laterality: Left;     Home Medications:  Prior to Admission medications   Medication Sig Start Date End Date Taking? Authorizing Provider  allopurinol (ZYLOPRIM) 100 MG tablet Take 200 mg by mouth daily.    Yes [provider]  amiodarone (PACERONE) 200 MG tablet Take 1 tablet (200 mg total) by mouth daily. 05/29/20  Yes Larey Dresser, MD  apixaban (  ELIQUIS) 2.5 MG TABS tablet Take 1 tablet (2.5 mg total) by mouth 2 (two) times daily. 03/27/20  Yes Larey Dresser, MD  cyanocobalamin 1000 MCG tablet Take 1 tablet (1,000 mcg total) by mouth daily. 03/13/19  Yes Larey Dresser, MD  diclofenac Sodium (VOLTAREN) 1 % GEL Apply 4 g topically 4 (four) times daily. Patient taking differently: Apply 4 g topically 2 (two) times daily as needed.  02/24/20  Yes Sheikh, Omair Latif, DO  finasteride (PROSCAR) 5 MG tablet Take 1 tablet (5 mg total) by mouth daily. Patient taking differently: Take 5 mg by mouth in the morning and at bedtime.  11/29/18  Yes Georgiana Shore, NP  hydrALAZINE (APRESOLINE) 25 MG tablet Take 0.5 tablets (12.5 mg total) by mouth every 8 (eight) hours. 02/24/20  Yes Sheikh, Omair Latif, DO  ipratropium-albuterol (DUONEB) 0.5-2.5 (3) MG/3ML SOLN Take 3 mLs by nebulization every 4 (four)  hours as needed. 05/14/17  Yes Jani Gravel, MD  isosorbide dinitrate (ISORDIL) 10 MG tablet Take 10 mg by mouth 3 (three) times daily.   Yes [provider]  levothyroxine (SYNTHROID) 125 MCG tablet Take 1 tablet (125 mcg total) by mouth daily before breakfast. 08/01/19  Yes Philemon Kingdom, MD  magnesium oxide (MAG-OX) 400 MG tablet Take 400 mg by mouth daily.    Yes [provider]  metolazone (ZAROXOLYN) 2.5 MG tablet Take 1 tablet (2.5 mg total) by mouth once a week. On wednesdays 08/08/20 11/06/20 Yes Larey Dresser, MD  polyethylene glycol (MIRALAX / GLYCOLAX) 17 g packet Take 17 g by mouth 2 (two) times daily. 02/24/20  Yes Sheikh, Omair Latif, DO  potassium chloride SA (KLOR-CON) 20 MEQ tablet Take 1 tablet (20 mEq total) by mouth once a week. Only on metolazone days 08/13/20  Yes Rosita Fire, Brittainy M, PA-C  rosuvastatin (CRESTOR) 10 MG tablet Take 1 tablet (10 mg total) by mouth daily at 6 PM. 12/22/18  Yes Skeet Latch, MD  senna-docusate (SENOKOT-S) 8.6-50 MG tablet Take 1 tablet by mouth 2 (two) times daily. 02/24/20  Yes Sheikh, Omair Latif, DO  sertraline (ZOLOFT) 25 MG tablet Take 25 mg by mouth daily.    Yes [provider]  spironolactone (ALDACTONE) 25 MG tablet Take 12.5 mg by mouth at bedtime.   Yes [provider]  tamsulosin (FLOMAX) 0.4 MG CAPS capsule Take 0.4 mg by mouth at bedtime.   Yes [provider]  torsemide (DEMADEX) 20 MG tablet Take 4 tablets (80 mg total) by mouth in the morning AND 2 tablets (40 mg total) every evening. 08/08/20  Yes Larey Dresser, MD    Inpatient Medications: Scheduled Meds: . amiodarone  200 mg Oral Daily  . apixaban  2.5 mg Oral BID  . levothyroxine  125 mcg Oral QAC breakfast  . polyethylene glycol  17 g Oral BID  . potassium chloride  40 mEq Oral Once  . rosuvastatin  10 mg Oral q1800  . senna-docusate  1 tablet Oral BID  . sertraline  25 mg Oral Daily   Continuous Infusions: .  azithromycin    . cefTRIAXone (ROCEPHIN)  IV     PRN Meds: acetaminophen **OR** acetaminophen, ipratropium-albuterol, ondansetron **OR** ondansetron (ZOFRAN) IV  Allergies:    Allergies  Allergen Reactions  . Lisinopril Swelling    Facial swelling  . Xarelto [Rivaroxaban] Other (See Comments)    "Blood came out of my penis"    Social History:   Social History   Socioeconomic History  .  Marital status: Married    Spouse name: Not on file  . Number of children: Not on file  . Years of education: Not on file  . Highest education level: Not on file  Occupational History  . Occupation: Retired    Comment: Former Corporate treasurer  Tobacco Use  . Smoking status: Former Smoker    Packs/day: 1.00    Years: 15.00    Pack years: 15.00    Types: Cigarettes, Cigars  . Smokeless tobacco: Former Systems developer  . Tobacco comment: quit smoking ~ 50 yr ago  Vaping Use  . Vaping Use: Never used  Substance and Sexual Activity  . Alcohol use: No    Alcohol/week: 2.0 standard drinks    Types: 2 Cans of beer per week    Comment: rare beer.  . Drug use: No  . Sexual activity: Yes  Other Topics Concern  . Not on file  Social History Narrative   Lives in [place] with wife. Moved from [place] (2013). Does not routinely exercise.   Social Determinants of Health   Financial Resource Strain:   . Difficulty of Paying Living Expenses: Not on file  Food Insecurity:   . Worried About Charity fundraiser in the Last Year: Not on file  . Ran Out of Food in the Last Year: Not on file  Transportation Needs:   . Lack of Transportation (Medical): Not on file  . Lack of Transportation (Non-Medical): Not on file  Physical Activity:   . Days of Exercise per Week: Not on file  . Minutes of Exercise per Session: Not on file  Stress:   . Feeling of Stress : Not on file  Social Connections:   . Frequency of Communication with Friends and Family: Not on file  . Frequency of Social Gatherings with Friends and Family:  Not on file  . Attends Religious Services: Not on file  . Active Member of Clubs or Organizations: Not on file  . Attends Archivist Meetings: Not on file  . Marital Status: Not on file  Intimate Partner Violence:   . Fear of Current or Ex-Partner: Not on file  . Emotionally Abused: Not on file  . Physically Abused: Not on file  . Sexually Abused: Not on file    Family History:   Family History  Problem Relation Age of Onset  . Heart disease Mother   . Heart Problems Maternal Grandmother   . Other Other        negative for premature CAD     ROS:  Please see the history of present illness.  All other ROS reviewed and negative.     Physical Exam/Data:   Vitals:   08/17/20 0031 08/17/20 0123 08/17/20 0350 08/17/20 0807  BP: (!) 85/55 (!) 93/59 99/60 (!) 93/57  Pulse: 80 81 87   Resp: _0 Temp: 97.7 F (36.5 C)  98.6 F (37 C) 98.2 F (36.8 C)  TempSrc: Oral  Oral   SpO2: 98% 98% 99%   Weight:      Height:        Intake/Output Summary (Last 24 hours) at 08/17/2020 1000 Last data filed at 08/17/2020 0400 Gross per 24 hour  Intake 1850 ml  Output 600 ml  Net 1250 ml   Last 3 Weights 08/16/2020 08/14/2020 08/13/2020  Weight (lbs) 194 lb 10.7 oz 186 lb 195 lb 6.4 oz  Weight (kg) 88.3 kg 84.369 kg 88.633 kg     Body  mass index is 30.49 kg/m.  General: 86 y.o. male resting comfortably in no acute distress. HEENT: Normocephalic and atraumatic. Sclera clear.  Neck: Supple. Difficult to assess JVD due to patient cooperation but do not appreciate any significant JVD. Heart: RRR. Distinct S1 and S2. Soft systolic murmur. No gallops or rubs. Radial pulses 2+ and equal bilaterally. Lungs: No increased work of breathing.  Abdomen: Soft, non-distended, and non-tender to palpation. Bowel sounds present. MSK: Normal strength and tone for age. Extremities: 1-2 edema of bilateral lower extremities. SCDs in place.    Skin: Warm and dry. Neuro: Alert and oriented x3.  No focal deficits. Psych: Normal affect. Responds appropriately.  EKG:  The EKG was personally reviewed and demonstrates: Sinus tachycardia, rate 104 bpm, with non-specific interventricular conduction delay and non-specific ST/T changes.  Telemetry:  Telemetry was personally reviewed and demonstrates:  Normal sinus rhythm with rates in the 80s. 1st degree AV block and occasional PVC.   Relevant CV Studies:  Right Heart Catheterization 02/19/2020: 1. Markedly elevated right and left heart filling pressures.  2. Preserved cardiac output.  3. Pulmonary venous hypertension.  _______________  Echocardiogram 03/27/2020: Impressions: 1. Left ventricular ejection fraction, by estimation, is 25%. The left  ventricle has severely decreased function. The left ventricle demonstrates  regional wall motion abnormalities. Diffuse hypokinesis with akinesis of  the basal to mid inferior and  inferolateral walls. The left ventricular internal cavity size was  moderately dilated. Left ventricular diastolic parameters are consistent  with Grade II diastolic dysfunction (pseudonormalization).  2. Right ventricular systolic function is mildly reduced. The right  ventricular size is normal. There is normal pulmonary artery systolic  pressure. The estimated right ventricular systolic pressure is 32.9 mmHg.  3. Left atrial size was mildly dilated.  4. Left pleural effusion noted.  5. The mitral valve is normal in structure. Moderate mitral valve  regurgitation. No evidence of mitral stenosis.  6. The aortic valve is tricuspid. Aortic valve regurgitation is trivial.  Mild aortic valve sclerosis is present, with no evidence of aortic valve  stenosis.  7. The inferior vena cava is normal in size with greater than 50%  respiratory variability, suggesting right atrial pressure of 3 mmHg.     Laboratory Data:  High Sensitivity Troponin:   Recent Labs  Lab 08/16/20 0948 08/16/20 1150 08/16/20 1611    TROPONINIHS 732* 1,147* 1,743*     Chemistry Recent Labs  Lab 08/13/20 1022 08/13/20 1022 08/16/20 0948 08/16/20 1611 08/17/20 0716  NA 136  --  135  --  139  K 3.6  --  3.5  --  3.2*  CL 98  --  97*  --  104  CO2 25  --  21*  --  25  GLUCOSE 115*  --  158*  --  94  BUN 49*  --  75*  --  62*  CREATININE 2.14*   < > 2.89* 2.38* 2.17*  CALCIUM 9.2  --  9.0  --  8.6*  GFRNONAA 27*   < > 19* 24* 27*  GFRAA 32*   < > 22* 28* 31*  ANIONGAP 13  --  17*  --  10   < > = values in this interval not displayed.    Recent Labs  Lab 08/16/20 0948 08/17/20 0716  PROT 7.8 7.1  ALBUMIN 2.2* 1.9*  AST 82* 94*  ALT 27 27  ALKPHOS 76 63  BILITOT 1.0 1.1   Hematology Recent Labs  Lab 08/16/20 [phone]  08/16/20 1611 08/17/20 0716  WBC 7.3 7.3 7.1  RBC 2.92* 2.91* 2.85*  HGB 8.2* 8.0* 7.7*  HCT 26.1* 25.5* 25.6*  MCV 89.4 87.6 89.8  MCH 28.1 27.5 27.0  MCHC 31.4 31.4 30.1  RDW 25.8* 25.8* 25.9*  PLT 254 211 214   BNPNo results for input(s): BNP, PROBNP in the last 168 hours.  DDimer No results for input(s): DDIMER in the last 168 hours.   Radiology/Studies:  CT Head Wo Contrast  Result Date: 08/16/2020 CLINICAL DATA:  Delirium and fall EXAM: CT HEAD WITHOUT CONTRAST TECHNIQUE: Contiguous axial images were obtained from the base of the skull through the vertex without intravenous contrast. COMPARISON:  None. FINDINGS: Brain: No evidence of acute infarction, hemorrhage, hydrocephalus, extra-axial collection or mass lesion/mass effect. Periventricular white matter hypodensities consistent with sequela of chronic microvascular ischemic disease. Vascular: Vascular calcifications. Skull: Normal. Negative for fracture or focal lesion. Sinuses/Orbits: No acute finding. Other: None. IMPRESSION: 1.  No acute intracranial abnormality. Electronically Signed   By: Valentino Saxon MD   On: 08/16/2020 11:01   DG Chest Port 1 View  Result Date: 08/16/2020 CLINICAL DATA:  Delirium EXAM:  PORTABLE CHEST 1 VIEW COMPARISON:  August 08, 2020 FINDINGS: The cardiomediastinal silhouette is unchanged and enlarged in contour.Evaluation is limited secondary to patient rotation. LEFT chest AICD. Small RIGHT pleural effusion. No pneumothorax. Perihilar vascular fullness with diffuse interstitial opacities, similar comparison to prior. Persistent heterogeneous opacity of the RIGHT lung base. Visualized abdomen is unremarkable. Multilevel degenerative changes of the thoracic spine. IMPRESSION: 1. Cardiomegaly with pulmonary edema and small RIGHT pleural effusion. 2. Persistent heterogeneous opacity of the RIGHT lung base. Differential considerations include infection versus atelectasis. Electronically Signed   By: Valentino Saxon MD   On: 08/16/2020 11:03    New York Heart Association (NYHA) Functional Class NYHA Class III  Assessment and Plan:   Elevated Troponin CAD s/p DES to LAD in 2019 - Patient presented to ED after a fall and admitted for sepsis secondary to UTI. Found to have an elevated troponin.  - High-sensitivity troponin elevated at 732 >> 1,147 >> 1,743.  - EKG shows no acute ischemic changes.  - Will update Echo. - Patient denies any angina. - Suspect demand ischemic in setting of sepsis with possible septic shock. Would hold off on IV Heparin for now and continue to trend troponin.  Chronic Systolic CHF - Patient has long history of chronic systolic CHF with known EF of 25% on last Echo in 02/2020. Recently admitted to [facility] hospital with CHF exacerbation. - Will repeat Echo. - Will check BNP. - Patient would not sit up for lung exam but possible crackles in bases. Chronic lower extremity edema. - BP has been soft and creatinine elevated. - Will continue to hold Torsemide, Spironolactone, Hydralazine, Isordil for now given hypotension and sepsis. - No ACEi/ARB/Entreto due to angioedema with Lisinopril. - Continue to monitor daily weights, strict I/O's, and renal  function.  - Part of ReDS clip program as outpatient. Will notify Pharmacy so they can check this tomorrow. This will help [place] assess volume status.  - He is a patient of Dr. Aundra Dubin. Discussed with Dr. Radford Pax - can keep on general cards list for now given sepsis seems to be driving factor rather than CHF.  Paroxysmal Atrial Fibrillation - Maintaining sinus rhythm.  - Continue PO Amiodarone 228m daily. - Continue Eliquis 2.579mtwice daily (reduced dose due to age and renal function).  S/p Biot[employer] ICD - Last device  check in 05/2020 showed no ventricular episodes.  - Can ask rep tomorrow to interrogate device to make sure no arrhythmia was cause of fall.  Sepsis/ Possible Septic Shock - Urinalysis showed large Hgb, large leukocytes, >50 RBC, >50 WBC, and few bacteria.  - Chest x-ray showed possible pneumonia on right side. - Procalcitonin elevated at 1.24 and lactic acid elevated at 4.4 >> 2.4.  - Urine and blood culture pending. - Continue antibiotics per primary team.  - BP soft - there is concern for possible septic shock. Patient may need PICC with COOX at some point if BP remains soft and there is concern for cardiogenic shock as well.  Fall - Patient reports he fall off his bed. Denies any prodromal symptoms. No syncope. - Head CT showed no acute findings.  - BP soft so possible orthostatic hypotension/dehydration with sepsis. - Recommended checking orthostatics prior to discharge. - Continue to monitor on telemetry.  Hypertension - History of hypertension by more hypotensive this admission. Systolic BP in 85'I to 62'V at times. - Continue to hold home medication (Hydralazine, Imdur, Spironolactone).  Hyperlipidemia - Continue home Crestor.   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 Recent baseline around 1.9 to 2.1.  - Suspect secondary to hypotension/sepsis. - Continue to monitor closely.  Anemia - Hemoglobin 8.2 on admission and 7.7  today. Baseline in 8.o to 9.5 range. - Patient denies any abnormal bleeding.  - Management per primary team.    For questions or updates, please contact [place] Please consult [email or web address] for contact info under    Signed, Darreld Mclean, PA-C  08/17/2020 10:00 AM

## 2020-08-17 NOTE — Progress Notes (Signed)
[facility] PROGRESS NOTE   Ryan Macdonald VGC:[id number] DOB: Feb 20, 1934 DOA: 08/16/2020  PCP: [facility]  Brief History/Interval Summary: 86 y.o. male with medical history significant of paroxysmal A. fib-on Eliquis, PE, type 2 diabetes mellitus, hypertension, hyperlipidemia, nonischemic cardiomyopathy with ejection fraction of 20 to 25% status post AICD placement, CKD stage III, gout, obesity, hypothyroidism brought by EMS for evaluation of fall and sepsis.  In the ED patient was noted to be tachycardic tachypneic with soft blood pressure.  Abnormal UA was noted.  He was found to have elevated troponin.  Started on antibiotics and hospitalized for further management.  Reason for Visit: Urinary tract infection.  Mechanical fall.  Consultants: We will consult cardiology for elevated troponin  Procedures: None yet  Antibiotics: Anti-infectives (From admission, onward)   Start     Dose/Rate Route Frequency Ordered Stop   08/17/20 1200  cefTRIAXone (ROCEPHIN) 2 g in sodium chloride 0.9 % 100 mL IVPB        2 g 200 mL/hr over 30 Minutes Intravenous Every 24 hours 08/16/20 1344     08/17/20 1200  azithromycin (ZITHROMAX) 500 mg in sodium chloride 0.9 % 250 mL IVPB        500 mg 250 mL/hr over 60 Minutes Intravenous Every 24 hours 08/16/20 1344     08/16/20 1115  cefTRIAXone (ROCEPHIN) 2 g in sodium chloride 0.9 % 100 mL IVPB        2 g 200 mL/hr over 30 Minutes Intravenous  Once 08/16/20 1107 08/16/20 1155   08/16/20 1115  azithromycin (ZITHROMAX) 500 mg in sodium chloride 0.9 % 250 mL IVPB        500 mg 250 mL/hr over 60 Minutes Intravenous  Once 08/16/20 1112 08/16/20 1416      Subjective/Interval History: Patient at this time denies any chest pain.   He denies any headaches.  Does not feel any weakness in his arms or legs. Is not a very good historian.  ROS: Denies any nausea or vomiting    Assessment/Plan:  Severe sepsis likely septic shock secondary  to UTI Chest x-ray reported as possibly showing pneumonia on the right side but patient does not have any cough.  No shortness of breath currently.  Unlikely to be an infectious process.  Follow-up on urine cultures.  Continue with current antibacterials for now.  Follow-up on blood cultures as well.  Procalcitonin elevated at 1.24.  Lactic acid was elevated at 4.4.  Improved to 2.4.  Fall/pain in the left lower extremity Per patient he did not have any syncopal episode.  He apparently slid off his bed.  Likely a mechanical fall.  Denies any pain but he does have limited range of motion of his left lower extremity.  He did have some pain when I tried to lift his leg.  We will get x-rays of his left hip.  CT head did not show any acute findings.  Elevated troponin in the setting of known coronary artery disease Troponins have climbed up significantly.  He denies any chest pain.  EKG shows findings seen previously as well with T inversion in 1 aVL.  Patient with significant cardiac history including chronic CHF.  His blood pressure has been running low.  Will request cardiology to see the patient and assist with management.  This could be demand ischemia but cannot rule out NSTEMI.  He does have known CAD with PCI in 2019.  Cardiac medications on hold due to low blood pressure.  History of chronic systolic CHF status post AICD Patient with history of nonischemic cardiomyopathy.  Strict ins and outs and daily weights.  Recently seen by heart failure clinic.  It looks like he is on torsemide 80 mg twice a day.  He was also asked to take metolazone more frequently than once a week because he was experiencing lower extremity edema.  Seems to be fairly euvolemic currently.  Noted to have low blood pressure and since his creatinine was higher than his baseline his diuretics and his cardiac medications have been held.  Request cardiology to weigh in regarding this as well.  Acute on chronic kidney disease stage  IIIb/hypokalemia Patient's baseline creatinine is around 1.9.  He came in with a BUN of 75 and creatinine of 2.89.  Could be due to sepsis could also be due to recent diuretic use.  His diuretics are on hold.  He was given fluids per the sepsis protocol.  Monitor urine output.  Creatinine has improved this morning to 2.17.  We will replace his potassium.  Paroxysmal atrial fibrillation On Eliquis and amiodarone which are being continued.  Hypothyroidism Continue with levothyroxine.  Essential hypertension Noted to have borderline low blood pressures.  Holding his antihypertensives.  Monitor closely.  He is asymptomatic.  Normocytic anemia Baseline hemoglobin noted between 8 and 9.  Noted to be 7.7 this morning which could be dilutional.  No evidence of overt bleeding.  Recheck labs tomorrow.  Hyperlipidemia Continue statin.  History of depression Continue Zoloft.  History of gout Holding allopurinol  History of BPH Holding his tamsulosin and finasteride.  These were held due to hypotension.  C  Obesity Estimated body mass index is 30.49 kg/m as calculated from the following:   Height as of this encounter: 5\' 7"  (1.702 m).   Weight as of this encounter: 88.3 kg.   DVT Prophylaxis: On Eliquis Code Status: Full code Family Communication: Discussed with the patient Disposition Plan: Lives with his wife.  Hopefully return home when improved.  PT and OT evaluation.  Status is: Inpatient  Remains inpatient appropriate because:IV treatments appropriate due to intensity of illness or inability to take PO and Inpatient level of care appropriate due to severity of illness   Dispo: The patient is from: Home              Anticipated d/c is to: Home              Anticipated d/c date is: 3 days              Patient currently is not medically stable to d/c.     Medications:  Scheduled: . amiodarone  200 mg Oral Daily  . apixaban  2.5 mg Oral BID  . levothyroxine  125 mcg Oral  QAC breakfast  . polyethylene glycol  17 g Oral BID  . rosuvastatin  10 mg Oral q1800  . senna-docusate  1 tablet Oral BID  . sertraline  25 mg Oral Daily   Continuous: . azithromycin    . cefTRIAXone (ROCEPHIN)  IV     **OR** acetaminophen, ipratropium-albuterol, ondansetron **OR** ondansetron (ZOFRAN) IV   Objective:  Vital Signs  Vitals:   08/17/20 0031 08/17/20 0123 08/17/20 0350 08/17/20 0807  BP: (!) 85/55 (!) 93/59 99/60 (!) 93/57  Pulse: 80 81 87   Resp: 16 20 20    Temp: 97.7 F (36.5 C)  98.6 F (37 C) 98.2 F (36.8 C)  TempSrc: Oral  Oral  SpO2: 98% 98% 99%   Weight:      Height:        Intake/Output Summary (Last 24 hours) at 08/17/2020 0929 Last data filed at 08/17/2020 0400 Gross per 24 hour  Intake 1850 ml  Output 600 ml  Net 1250 ml   Filed Weights   08/16/20 1024  Weight: 88.3 kg    General appearance: Awake alert.  In no distress Resp: Clear to auscultation bilaterally.  Normal effort Cardio: S1-S2 is normal regular.  No S3-S4.  No rubs murmurs or bruit GI: Abdomen is soft.  Nontender nondistended.  Bowel sounds are present normal.  No masses organomegaly Extremities: Minimal edema bilateral lower extremity.  Limited range of motion of the left leg.  Pain noted when the leg was raised.  Pain was in the left hip area. Neurologic: No obvious facial asymmetry.  Motor strength equal bilateral upper extremities.  Was able to raise the right leg.  Was unable to raise the left leg.   Lab Results:  Data Reviewed: I have personally reviewed following labs and imaging studies  CBC: Recent Labs  Lab 08/16/20 0948 08/16/20 1611 08/17/20 0716  WBC 7.3 7.3 7.1  NEUTROABS 6.0  --   --   HGB 8.2* 8.0* 7.7*  HCT 26.1* 25.5* 25.6*  MCV 89.4 87.6 89.8  PLT 254 211 214    Basic Metabolic Panel: Recent Labs  Lab 08/13/20 1022 08/16/20 0948 08/16/20 1611 08/17/20 0716  NA 136 135  --  139  K 3.6 3.5  --  3.2*  CL 98 97*  --   104  CO2 25 21*  --  25  GLUCOSE 115* 158*  --  94  BUN 49* 75*  --  62*  CREATININE 2.14* 2.89* 2.38* 2.17*  CALCIUM 9.2 9.0  --  8.6*  MG  --   --  2.0  --   PHOS  --   --  4.0  --     GFR: Estimated Creatinine Clearance: 26.9 mL/min (A) (by C-G formula based on SCr of 2.17 mg/dL (H)).  Liver Function Tests: Recent Labs  Lab 08/16/20 0948 08/17/20 0716  AST 82* 94*  ALT 27 27  ALKPHOS 76 63  BILITOT 1.0 1.1  PROT 7.8 7.1  ALBUMIN 2.2* 1.9*    Coagulation Profile: Recent Labs  Lab 08/16/20 0948 08/17/20 0716  INR 1.8* 1.8*    Cardiac Enzymes: Recent Labs  Lab 08/16/20 1150  CKTOTAL 732*    CBG: Recent Labs  Lab 08/16/20 0942 08/17/20 0033  GLUCAP 162* 101*    Thyroid Function Tests: Recent Labs    08/16/20 1611  TSH 1.475     Recent Results (from the past 240 hour(s))  SARS Coronavirus 2 by RT PCR (hospital order, performed in [facility] hospital lab) Nasopharyngeal Nasopharyngeal Swab     Status: None   Collection Time: 08/16/20  9:40 AM   Specimen: Nasopharyngeal Swab  Result Value Ref Range Status   SARS Coronavirus 2 NEGATIVE NEGATIVE Final    Comment: (NOTE) SARS-CoV-2 target nucleic acids are NOT DETECTED.  The SARS-CoV-2 RNA is generally detectable in upper and lower respiratory specimens during the acute phase of infection. The lowest concentration of SARS-CoV-2 viral copies this assay can detect is 250 copies / mL. A negative result does not preclude SARS-CoV-2 infection and should not be used as the sole basis for treatment or other patient management decisions.  A negative result may occur with improper specimen collection /  handling, submission of specimen other than nasopharyngeal swab, presence of viral mutation(s) within the areas targeted by this assay, and inadequate number of viral copies (<250 copies / mL). A negative result must be combined with clinical observations, patient history, and epidemiological  information.  Fact Sheet for Patients:   BoilerBrush.com.cy  Fact Sheet for Healthcare Providers: https://pope.com/  This test is not yet approved or  cleared by the [place] FDA and has been authorized for detection and/or diagnosis of SARS-CoV-2 by FDA under an Emergency Use Authorization (EUA).  This EUA will remain in effect (meaning this test can be used) for the duration of the COVID-19 declaration under Section 564(b)(1) of the Act, 21 U.S.C. section 360bbb-3(b)(1), unless the authorization is terminated or revoked sooner.  Performed at [facility], [address], [place], [place] [address]   Blood culture (routine single)     Status: None (Preliminary result)   Collection Time: 08/16/20 10:04 AM   Specimen: BLOOD RIGHT FOREARM  Result Value Ref Range Status   Specimen Description BLOOD RIGHT FOREARM  Final   Special Requests   Final    BOTTLES DRAWN AEROBIC AND ANAEROBIC Blood Culture results may not be optimal due to an excessive volume of blood received in culture bottles   Culture   Final    NO GROWTH < 12 HOURS Performed at [facility], [address] [place], [place] [address]    Report Status PENDING  Incomplete  Urine culture     Status: Abnormal (Preliminary result)   Collection Time: 08/16/20 12:40 PM   Specimen: In/Out Cath Urine  Result Value Ref Range Status   Specimen Description IN/OUT CATH URINE  Final   Special Requests NONE  Final   Culture (A)  Final    >=100,000 COLONIES/mL GRAM NEGATIVE RODS CULTURE REINCUBATED FOR BETTER GROWTH SUSCEPTIBILITIES TO FOLLOW Performed at [facility], [address] [place], [place] [address]    Report Status PENDING  Incomplete      Radiology Studies: CT Head Wo Contrast  Result Date: 08/16/2020 CLINICAL DATA:  Delirium and fall EXAM: CT HEAD WITHOUT CONTRAST TECHNIQUE: Contiguous axial images were obtained from the base of the skull  through the vertex without intravenous contrast. COMPARISON:  None. FINDINGS: Brain: No evidence of acute infarction, hemorrhage, hydrocephalus, extra-axial collection or mass lesion/mass effect. Periventricular white matter hypodensities consistent with sequela of chronic microvascular ischemic disease. Vascular: Vascular calcifications. Skull: Normal. Negative for fracture or focal lesion. Sinuses/Orbits: No acute finding. Other: None. IMPRESSION: 1.  No acute intracranial abnormality. Electronically Signed   By: Meda Klinefelter MD   On: 08/16/2020 11:01   DG Chest Port 1 View  Result Date: 08/16/2020 CLINICAL DATA:  Delirium EXAM: PORTABLE CHEST 1 VIEW COMPARISON:  August 08, 2020 FINDINGS: The cardiomediastinal silhouette is unchanged and enlarged in contour.Evaluation is limited secondary to patient rotation. LEFT chest AICD. Small RIGHT pleural effusion. No pneumothorax. Perihilar vascular fullness with diffuse interstitial opacities, similar comparison to prior. Persistent heterogeneous opacity of the RIGHT lung base. Visualized abdomen is unremarkable. Multilevel degenerative changes of the thoracic spine. IMPRESSION: 1. Cardiomegaly with pulmonary edema and small RIGHT pleural effusion. 2. Persistent heterogeneous opacity of the RIGHT lung base. Differential considerations include infection versus atelectasis. Electronically Signed   By: Meda Klinefelter MD   On: 08/16/2020 11:03       LOS: 1 day   Osvaldo Shipper  [facility] Pager on [email or web address]  08/17/2020, 9:29 AM

## 2020-08-17 NOTE — Progress Notes (Signed)
  Echocardiogram 2D Echocardiogram has been performed.  Augustine Radar 08/17/2020, 4:20 PM

## 2020-08-17 NOTE — Evaluation (Addendum)
Physical Therapy Evaluation Patient Details Name: Ryan Macdonald MRN: [id number] DOB: August 22, 1934 Today's Date: 08/17/2020   History of Present Illness  Patient is a 86 y/o male who presents with AMS and s/p fall. Admitted with Severe sepsis likely septic shock secondary to UTI. PMH includes NICM EF 20-25%, AICD placement, gout, obesity, PAF on eliquis, PE, DM2, cardiac stenting, CHF, CKD, A-fib, HTN.  Clinical Impression  Patient presents with generalized weakness, impaired balance, impaired cognition, decreased activity tolerance and impaired mobility s/p above. Pt reports living at home with wife and using RW vs SPC for ambulation PTA. Reports falls. Pt not the best historian at this time. Today, pt requires Max A for bed mobility, transfers and Min A for taking a few steps along side bed with RW with max encouragement and coaxing. "I don't like to do things I do not want to do." Per chart, pt will need to negotiate 8 stairs to get into apt. Would benefit from SNF to maximize independence and mobility prior to return home. Will follow acutely.    Follow Up Recommendations SNF;Supervision for mobility/OOB    Equipment Recommendations  None recommended by PT    Recommendations for Other Services       Precautions / Restrictions Precautions Precautions: Fall Restrictions Weight Bearing Restrictions: No      Mobility  Bed Mobility Overal bed mobility: Needs Assistance Bed Mobility: Supine to Sit;Sit to Supine     Supine to sit: Max assist;HOB elevated Sit to supine: Mod assist;HOB elevated   General bed mobility comments: Able to initiate movement of LEs but needs help, cues to reach for rail, assist with scooting bottom and trunk to get to EOB. Increased time.  Transfers Overall transfer level: Needs assistance Equipment used: Rolling walker (2 wheeled) Transfers: Sit to/from Stand Sit to Stand: Max assist         General transfer comment: Assist to power to standing with  cues for hand placement/technique and use of momentum. Reluctant to stand. Bil knees/hips flexed.  Ambulation/Gait Ambulation/Gait assistance: Min assist Gait Distance (Feet): 3 Feet Assistive device: Rolling walker (2 wheeled) Gait Pattern/deviations: Shuffle Gait velocity: decreased   General Gait Details: Able to take a few steps along side bed with max encouragement and assist with RW management.  Stairs            Wheelchair Mobility    Modified Rankin (Stroke Patients Only)       Balance Overall balance assessment: Needs assistance;History of Falls Sitting-balance support: Feet supported;Single extremity supported Sitting balance-Leahy Scale: Poor Sitting balance - Comments: Requires Ue support sitting EOB.   Standing balance support: During functional activity Standing balance-Leahy Scale: Poor Standing balance comment: Requires UE and external support for standing with RW, flexed position in hips/knees.                             Pertinent Vitals/Pain Pain Assessment: Faces Faces Pain Scale: Hurts little more Pain Location: says no but moans with movement of LEs Pain Descriptors / Indicators: Grimacing;Moaning Pain Intervention(s): Monitored during session;Repositioned;Limited activity within patient's tolerance    Home Living Family/patient expects to be discharged to:: Private residence Living Arrangements: Spouse/significant other Available Help at Discharge: Family Type of Home: Apartment Home Access: Stairs to enter Entrance Stairs-Rails: Right Entrance Stairs-Number of Steps: 8 Home Layout: One level Home Equipment: Environmental consultant - 2 wheels;Cane - single point Additional Comments: Likely not the best historian.    Prior  Function Level of Independence: Independent with assistive device(s)         Comments: pt reports using cane and RW for ambulation as needed.  Pt able to perform ADLs for self. Drives.     Hand Dominance   Dominant  Hand: Right    Extremity/Trunk Assessment   Upper Extremity Assessment Upper Extremity Assessment: Defer to OT evaluation    Lower Extremity Assessment Lower Extremity Assessment: Generalized weakness (swelling in bil feet)       Communication   Communication: No difficulties  Cognition Arousal/Alertness: Awake/alert;Lethargic Behavior During Therapy: WFL for tasks assessed/performed Overall Cognitive Status: Impaired/Different from baseline Area of Impairment: Orientation;Memory;Following commands;Safety/judgement;Awareness;Problem solving                 Orientation Level: Disoriented to;Situation;Time   Memory: Decreased short-term memory Following Commands: Follows one step commands inconsistently;Follows one step commands with increased time Safety/Judgement: Decreased awareness of safety;Decreased awareness of deficits Awareness: Intellectual Problem Solving: Slow processing;Decreased initiation;Difficulty sequencing;Requires verbal cues;Requires tactile cues General Comments: Knows he is at [facility]. States "21st" when asked the month and then when given 2 choices, picks "july." Pt states "I don't like to do things I do not need to do."      General Comments General comments (skin integrity, edema, etc.): VSS on RA.    Exercises     Assessment/Plan    PT Assessment Patient needs continued PT services  PT Problem List Decreased strength;Decreased mobility;Decreased safety awareness;Decreased range of motion;Decreased activity tolerance;Decreased cognition;Decreased balance       PT Treatment Interventions Therapeutic activities;Gait training;Therapeutic exercise;Patient/family education;Balance training;Functional mobility training;Neuromuscular re-education;Stair training;DME instruction    PT Goals (Current goals can be found in the Care Plan section)  Acute Rehab PT Goals Patient Stated Goal: to rest PT Goal Formulation: With patient Time For Goal  Achievement: 08/31/20 Potential to Achieve Goals: Fair    Frequency Min 2X/week   Barriers to discharge Inaccessible home environment stairs    Co-evaluation               AM-PAC PT "6 Clicks" Mobility  Outcome Measure Help needed turning from your back to your side while in a flat bed without using bedrails?: A Lot Help needed moving from lying on your back to sitting on the side of a flat bed without using bedrails?: Total Help needed moving to and from a bed to a chair (including a wheelchair)?: Total Help needed standing up from a chair using your arms (e.g., wheelchair or bedside chair)?: Total Help needed to walk in hospital room?: A Lot Help needed climbing 3-5 steps with a railing? : Total 6 Click Score: 8    End of Session Equipment Utilized During Treatment: Gait belt Activity Tolerance: Patient limited by fatigue Patient left: in bed;with call bell/phone within reach;with bed alarm set;with SCD's reapplied Nurse Communication: Mobility status PT Visit Diagnosis: Pain;Muscle weakness (generalized) (M62.81);Unsteadiness on feet (R26.81);Difficulty in walking, not elsewhere classified (R26.2) Pain - Right/Left:  (bilateral) Pain - part of body: Leg    Time: 7048-8891 PT Time Calculation (min) (ACUTE ONLY): 28 min   Charges:   PT Evaluation $PT Eval Moderate Complexity: 1 Mod PT Treatments $Therapeutic Activity: 8-22 mins        Vale Haven, PT, DPT Acute Rehabilitation Services Pager [phone] Office [phone]      Blake Divine A Justus Droke 08/17/2020, 11:08 AM

## 2020-08-17 NOTE — Progress Notes (Signed)
11:52am Yellow MEWS- BP  86/54 and RR 24.   11:55          BP 93/60 and RR 26  12:46pm Recheck BP 99/58 and RR 24-25  Patient is asymptomatic. Denies dizziness. No resp distress noted. MD made aware. Will continue to monitor patient closely.

## 2020-08-17 NOTE — Social Work (Signed)
No family available bedside. CSW contacted patient's spouse [name] to complete assessment. CSW left a voicemail, awaiting a callback.  Manfred Arch, LCSWA Clinical Social Worker

## 2020-08-18 DIAGNOSIS — D638 Anemia in other chronic diseases classified elsewhere: Secondary | ICD-10-CM

## 2020-08-18 DIAGNOSIS — N179 Acute kidney failure, unspecified: Secondary | ICD-10-CM

## 2020-08-18 DIAGNOSIS — I5022 Chronic systolic (congestive) heart failure: Secondary | ICD-10-CM

## 2020-08-18 LAB — COMPREHENSIVE METABOLIC PANEL
ALT: 26 U/L (ref 0–44)
AST: 87 U/L — ABNORMAL HIGH (ref 15–41)
Albumin: 1.9 g/dL — ABNORMAL LOW (ref 3.5–5.0)
Alkaline Phosphatase: 64 U/L (ref 38–126)
Anion gap: 8 (ref 5–15)
BUN: 51 mg/dL — ABNORMAL HIGH (ref 8–23)
CO2: 26 mmol/L (ref 22–32)
Calcium: 8.7 mg/dL — ABNORMAL LOW (ref 8.9–10.3)
Chloride: 106 mmol/L (ref 98–111)
Creatinine, Ser: 1.91 mg/dL — ABNORMAL HIGH (ref 0.61–1.24)
GFR calc Af Amer: 36 mL/min — ABNORMAL LOW (ref >60)
GFR calc non Af Amer: 31 mL/min — ABNORMAL LOW (ref >60)
Glucose, Bld: 109 mg/dL — ABNORMAL HIGH (ref 70–99)
Potassium: 3.4 mmol/L — ABNORMAL LOW (ref 3.5–5.1)
Sodium: 140 mmol/L (ref 135–145)
Total Bilirubin: 0.8 mg/dL (ref 0.3–1.2)
Total Protein: 7 g/dL (ref 6.5–8.1)

## 2020-08-18 LAB — LEGIONELLA PNEUMOPHILA SEROGP 1 UR AG: L. pneumophila Serogp 1 Ur Ag: NEGATIVE

## 2020-08-18 LAB — CBC
HCT: 25.4 % — ABNORMAL LOW (ref 39.0–52.0)
Hemoglobin: 7.9 g/dL — ABNORMAL LOW (ref 13.0–17.0)
MCH: 28.1 pg (ref 26.0–34.0)
MCHC: 31.1 g/dL (ref 30.0–36.0)
MCV: 90.4 fL (ref 80.0–100.0)
Platelets: 216 10*3/uL (ref 150–400)
RBC: 2.81 MIL/uL — ABNORMAL LOW (ref 4.22–5.81)
RDW: 26.1 % — ABNORMAL HIGH (ref 11.5–15.5)
WBC: 7.1 10*3/uL (ref 4.0–10.5)
nRBC: 0 % (ref 0.0–0.2)

## 2020-08-18 LAB — URINE CULTURE: Culture: >=100000 — AB

## 2020-08-18 MED ORDER — POTASSIUM CHLORIDE CRYS ER 20 MEQ PO TBCR
40.0000 meq | EXTENDED_RELEASE_TABLET | Freq: Once | ORAL | Status: AC
  Administered 2020-08-18: 40 meq via ORAL
  Filled 2020-08-18: qty 2

## 2020-08-18 MED ORDER — AMOXICILLIN 500 MG PO CAPS
500.0000 mg | ORAL_CAPSULE | Freq: Two times a day (BID) | ORAL | Status: DC
  Administered 2020-08-18 – 2020-08-19 (×3): 500 mg via ORAL
  Filled 2020-08-18 (×3): qty 1

## 2020-08-18 MED ORDER — POLYSACCHARIDE IRON COMPLEX 150 MG PO CAPS
150.0000 mg | ORAL_CAPSULE | Freq: Every day | ORAL | Status: DC
  Administered 2020-08-18 – 2020-08-21 (×4): 150 mg via ORAL
  Filled 2020-08-18 (×4): qty 1

## 2020-08-18 MED ORDER — BENZONATATE 100 MG PO CAPS
100.0000 mg | ORAL_CAPSULE | Freq: Three times a day (TID) | ORAL | Status: DC | PRN
  Administered 2020-08-18: 100 mg via ORAL
  Filled 2020-08-18: qty 1

## 2020-08-18 NOTE — Progress Notes (Signed)
Progress Note  Patient Name: Ryan Macdonald Date of Encounter: 08/18/2020  Primary Cardiologist:  [name], MD  Subjective   Tired from no sleep and hungry, hopes to get rest and food soon.   Inpatient Medications    Scheduled Meds:  amiodarone  200 mg Oral Daily   apixaban  2.5 mg Oral BID   levothyroxine  125 mcg Oral QAC breakfast   polyethylene glycol  17 g Oral BID   rosuvastatin  10 mg Oral q1800   senna-docusate  1 tablet Oral BID   sertraline  25 mg Oral Daily   Continuous Infusions:  azithromycin 500 mg (08/17/20 1238)   cefTRIAXone (ROCEPHIN)  IV 2 g (08/17/20 1158)   PRN Meds: acetaminophen **OR** acetaminophen, ipratropium-albuterol, ondansetron **OR** ondansetron (ZOFRAN) IV   Vital Signs    Vitals:   08/17/20 2351 08/18/20 0410 08/18/20 0715 08/18/20 0825  BP: (!) 92/58 99/63 104/62 103/62  Pulse: 79 79 77 77  Resp: 20 20 18 20   Temp: 98 F (36.7 C) 98.4 F (36.9 C)  (!) 97.4 F (36.3 C)  TempSrc: Oral Oral  Oral  SpO2: 97% 97% 95% 99%  Weight:      Height:        Intake/Output Summary (Last 24 hours) at 08/18/2020 0946 Last data filed at 08/18/2020 0500 Gross per 24 hour  Intake 120 ml  Output 400 ml  Net -280 ml   Filed Weights   08/16/20 1024  Weight: 88.3 kg   Last Weight  Most recent update: 08/16/2020 10:25 AM   Weight  88.3 kg (194 lb 10.7 oz)           Weight change:    Telemetry    SR, PVCs - Personally Reviewed  ECG    None today - Personally Reviewed  Physical Exam   General: Well developed, well nourished, male appearing in no acute distress. Head: Normocephalic, atraumatic.  Neck: Supple without bruits, JVD mildly elevated. Lungs:  Resp regular and unlabored, rales bases Heart: RRR, S1, S2, no S3, S4, soft murmur; no rub. Abdomen: Soft, non-tender, non-distended with normoactive bowel sounds. No hepatomegaly. No rebound/guarding. No obvious abdominal masses. Extremities: No clubbing,  cyanosis, no edema. Distal pedal pulses are 2+ bilaterally. Neuro: Alert and oriented X 3. Moves all extremities spontaneously. Psych: Normal affect.  Labs    Hematology Recent Labs  Lab 08/16/20 1611 08/17/20 0716 08/18/20 0144  WBC 7.3 7.1 7.1  RBC 2.91* 2.85* 2.81*  HGB 8.0* 7.7* 7.9*  HCT 25.5* 25.6* 25.4*  MCV 87.6 89.8 90.4  MCH 27.5 27.0 28.1  MCHC 31.4 30.1 31.1  RDW 25.8* 25.9* 26.1*  PLT 211 214 216    Chemistry Recent Labs  Lab 08/16/20 0948 08/16/20 0948 08/16/20 1611 08/17/20 0716 08/18/20 0144  NA 135  --   --  139 140  K 3.5  --   --  3.2* 3.4*  CL 97*  --   --  104 106  CO2 21*  --   --  25 26  GLUCOSE 158*  --   --  94 109*  BUN 75*  --   --  62* 51*  CREATININE 2.89*   < > 2.38* 2.17* 1.91*  CALCIUM 9.0  --   --  8.6* 8.7*  PROT 7.8  --   --  7.1 7.0  ALBUMIN 2.2*  --   --  1.9* 1.9*  AST 82*  --   --  94* 87*  ALT 27  --   --  27 26  ALKPHOS 76  --   --  63 64  BILITOT 1.0  --   --  1.1 0.8  GFRNONAA 19*   < > 24* 27* 31*  GFRAA 22*   < > 28* 31* 36*  ANIONGAP 17*  --   --  10 8   < > = values in this interval not displayed.     High Sensitivity Troponin:   Recent Labs  Lab 08/16/20 0948 08/16/20 1150 08/16/20 1611  TROPONINIHS 732* 1,147* 1,743*      BNP Recent Labs  Lab 08/17/20 0716  BNP 2,349.4*     DDimer No results for input(s): DDIMER in the last 168 hours.   Radiology    CT Head Wo Contrast  Result Date: 08/16/2020 CLINICAL DATA:  Delirium and fall EXAM: CT HEAD WITHOUT CONTRAST TECHNIQUE: Contiguous axial images were obtained from the base of the skull through the vertex without intravenous contrast. COMPARISON:  None. FINDINGS: Brain: No evidence of acute infarction, hemorrhage, hydrocephalus, extra-axial collection or mass lesion/mass effect. Periventricular white matter hypodensities consistent with sequela of chronic microvascular ischemic disease. Vascular: Vascular calcifications. Skull: Normal. Negative for  fracture or focal lesion. Sinuses/Orbits: No acute finding. Other: None. IMPRESSION: 1.  No acute intracranial abnormality. Electronically Signed   By: Meda Klinefelter MD   On: 08/16/2020 11:01   DG Chest Port 1 View  Result Date: 08/16/2020 CLINICAL DATA:  Delirium EXAM: PORTABLE CHEST 1 VIEW COMPARISON:  August 08, 2020 FINDINGS: The cardiomediastinal silhouette is unchanged and enlarged in contour.Evaluation is limited secondary to patient rotation. LEFT chest AICD. Small RIGHT pleural effusion. No pneumothorax. Perihilar vascular fullness with diffuse interstitial opacities, similar comparison to prior. Persistent heterogeneous opacity of the RIGHT lung base. Visualized abdomen is unremarkable. Multilevel degenerative changes of the thoracic spine. IMPRESSION: 1. Cardiomegaly with pulmonary edema and small RIGHT pleural effusion. 2. Persistent heterogeneous opacity of the RIGHT lung base. Differential considerations include infection versus atelectasis. Electronically Signed   By: Meda Klinefelter MD   On: 08/16/2020 11:03   ECHOCARDIOGRAM COMPLETE  Result Date: 08/17/2020    ECHOCARDIOGRAM REPORT   Patient Name:   Ryan Macdonald Date of Exam: 08/17/2020 Medical Rec #:  [id number]      Height:       67.0 in Accession #:    [id number]     Weight:       194.7 lb Date of Birth:  15-Sep-1934      BSA:          1.999 m Patient Age:    86 years       BP:           99/58 mmHg Patient Gender: M              HR:           82 bpm. Exam Location:  Inpatient Procedure: 2D Echo, Cardiac Doppler and Color Doppler Indications:    Elevated Troponin  History:        Patient has prior history of Echocardiogram examinations, most                 recent 03/27/2020. Cardiomyopathy and CHF, Arrythmias:Atrial                 Fibrillation, Signs/Symptoms:Dyspnea; Risk Factors:Hypertension                 and Diabetes.  Sonographer:  Eulah Pont RDCS Referring Phys: [id number] Corrin Parker  Sonographer Comments:  Technically difficult study due to poor echo windows. IMPRESSIONS  1. Left ventricular ejection fraction, by estimation, is 20 to 25%. The left ventricle has severely decreased function. The left ventricle demonstrates global hypokinesis. The left ventricular internal cavity size was severely dilated. Left ventricular diastolic parameters are indeterminate.  2. Right ventricular systolic function is mildly reduced. The right ventricular size is normal. There is normal pulmonary artery systolic pressure. The estimated right ventricular systolic pressure is 19.4 mmHg.  3. Left atrial size was moderately dilated.  4. The mitral valve is normal in structure. Mild mitral valve regurgitation. No evidence of mitral stenosis.  5. The aortic valve has an indeterminant number of cusps. There is severe calcifcation of the aortic valve. There is severe thickening of the aortic valve. Aortic valve regurgitation is not visualized. There is at least Moderate aortic valve stenosis and possibly severe by calculated AVA but the mean and peak transaortic gradients are low due to low flow low output AS. SVI is low at 21. Aortic valve area, by VTI measures 1.27 cm. Aortic valve mean gradient measures 6.0 mmHg.  6. The inferior vena cava is dilated in size with >50% respiratory variability, suggesting right atrial pressure of 8 mmHg. FINDINGS  Left Ventricle: Left ventricular ejection fraction, by estimation, is 20 to 25%. The left ventricle has severely decreased function. The left ventricle demonstrates global hypokinesis. The left ventricular internal cavity size was severely dilated. There is no left ventricular hypertrophy of the basal-septal segment. Left ventricular diastolic function could not be evaluated due to atrial fibrillation. Left ventricular diastolic parameters are indeterminate. Right Ventricle: The right ventricular size is normal. No increase in right ventricular wall thickness. Right ventricular systolic function  is mildly reduced. There is normal pulmonary artery systolic pressure. The tricuspid regurgitant velocity is 1.69 m/s, and with an assumed right atrial pressure of 8 mmHg, the estimated right ventricular systolic pressure is 19.4 mmHg. Left Atrium: Left atrial size was moderately dilated. Right Atrium: Right atrial size was normal in size. Pericardium: There is no evidence of pericardial effusion. Mitral Valve: The mitral valve is normal in structure. Mild mitral valve regurgitation. No evidence of mitral valve stenosis. Tricuspid Valve: The tricuspid valve is normal in structure. Tricuspid valve regurgitation is trivial. No evidence of tricuspid stenosis. Aortic Valve: The aortic valve has an indeterminant number of cusps. There is severe calcifcation of the aortic valve. There is severe thickening of the aortic valve. Aortic valve regurgitation is not visualized. Moderate aortic stenosis is present. Aortic valve mean gradient measures 6.0 mmHg. Aortic valve peak gradient measures 12.0 mmHg. Aortic valve area, by VTI measures 1.27 cm. Pulmonic Valve: The pulmonic valve was normal in structure. Pulmonic valve regurgitation is not visualized. No evidence of pulmonic stenosis. Aorta: The aortic root is normal in size and structure. Venous: The inferior vena cava is dilated in size with greater than 50% respiratory variability, suggesting right atrial pressure of 8 mmHg. IAS/Shunts: No atrial level shunt detected by color flow Doppler. Additional Comments: A pacer wire is visualized.  LEFT VENTRICLE PLAX 2D LVIDd:         7.50 cm LVIDs:         6.60 cm LV PW:         1.00 cm LV IVS:        0.90 cm LVOT diam:     1.90 cm LV SV:  42 LV SV Index:   21 LVOT Area:     2.84 cm  LV Volumes (MOD) LV vol d, MOD A4C: 221.0 ml LV vol s, MOD A4C: 150.0 ml LV SV MOD A4C:     221.0 ml RIGHT VENTRICLE RV S prime:     7.05 cm/s TAPSE (M-mode): 1.3 cm LEFT ATRIUM              Index       RIGHT ATRIUM           Index LA diam:         4.30 cm  2.15 cm/m  RA Area:     17.30 cm LA Vol (A2C):   103.0 ml 51.52 ml/m RA Volume:   50.00 ml  25.01 ml/m LA Vol (A4C):   74.7 ml  37.37 ml/m LA Biplane Vol: 88.9 ml  44.47 ml/m  AORTIC VALVE AV Area (Vmax):    1.48 cm AV Area (Vmean):   1.38 cm AV Area (VTI):     1.27 cm AV Vmax:           173.00 cm/s AV Vmean:          116.000 cm/s AV VTI:            0.328 m AV Peak Grad:      12.0 mmHg AV Mean Grad:      6.0 mmHg LVOT Vmax:         90.60 cm/s LVOT Vmean:        56.300 cm/s LVOT VTI:          0.147 m LVOT/AV VTI ratio: 0.45  AORTA Ao Root diam: 3.50 cm Ao Asc diam:  3.60 cm MR Peak grad: 58.7 mmHg   TRICUSPID VALVE MR Mean grad: 34.0 mmHg   TR Peak grad:   11.4 mmHg MR Vmax:      383.00 cm/s TR Vmax:        169.00 cm/s MR Vmean:     273.0 cm/s                           SHUNTS                           Systemic VTI:  0.15 m                           Systemic Diam: 1.90 cm Armanda Magic MD Electronically signed by Armanda Magic MD Signature Date/Time: 08/17/2020/4:32:52 PM    Final    DG HIP PORT UNILAT WITH PELVIS 1V LEFT  Result Date: 08/17/2020 CLINICAL DATA:  Pain in LEFT hip EXAM: DG HIP (WITH OR WITHOUT PELVIS) 1V PORT LEFT COMPARISON:  March 14, 2019 FINDINGS: Status post LEFT hip arthroplasty. Orthopedic hardware is intact and without periprosthetic fracture or lucency. There is partial bridging heterotopic ossifications along the LEFT superior implant, unchanged in comparison to prior. Osteopenia. No acute fracture or dislocation. Mild degenerative changes of the RIGHT hip. Degenerative changes of the lower lumbar spine. Transitional anatomy with a LEFT-sided assimilation joint at L5-S1. No area of erosion or osseous destruction. No unexpected radiopaque foreign body. Vascular calcifications. IMPRESSION: 1. Status post LEFT hip arthroplasty without evidence of hardware complication. 2. No acute osseous abnormality. Electronically Signed   By: Meda Klinefelter MD   On: 08/17/2020 11:09      Cardiac  Studies   ECHO:  08/17/2020 1. Left ventricular ejection fraction, by estimation, is 20 to 25%. The  left ventricle has severely decreased function. The left ventricle  demonstrates global hypokinesis. The left ventricular internal cavity size  was severely dilated. Left ventricular diastolic parameters are indeterminate.  2. Right ventricular systolic function is mildly reduced. The right  ventricular size is normal. There is normal pulmonary artery systolic  pressure. The estimated right ventricular systolic pressure is 19.4 mmHg.  3. Left atrial size was moderately dilated.  4. The mitral valve is normal in structure. Mild mitral valve  regurgitation. No evidence of mitral stenosis.  5. The aortic valve has an indeterminant number of cusps. There is severe calcifcation of the aortic valve. There is severe thickening of the aortic valve. Aortic valve regurgitation is not visualized. There is at least  Moderate aortic valve stenosis and possibly severe by calculated AVA but the mean and peak transaortic gradients are low due to low flow low output AS. SVI is low at 21. Aortic valve area, by VTI measures 1.27 cm. Aortic valve mean gradient measures 6.0 mmHg.  6. The inferior vena cava is dilated in size with >50% respiratory  variability, suggesting right atrial pressure of 8 mmHg.   Patient Profile     86 y.o. male w/ hx CAD s/p DES to proximal LAD in 07/2018 with residual non-obstructive disease, chronic systolic CHF/non-ischemic cardiomyopathy with EF of 25% on most recent Echo in 02/2020, s/p ICD for primary prevention, atrial fibrillation on Eliquis, chronic hypoxic respiratory failure on home O2 PRN, hypertension, type 2 diabetes mellitus, and gout  who was admitted 09/18 w/ sepsis, Cards saw 09/19 for elevated troponin.  Assessment & Plan    1. Elevated troponin - peak 1743 - echo results above - concern for low-flow, low-output AS - discuss further eval once  pt recovers from acute event - no ischemic sx - MD advise if pt needs R/L heart cath prior to d/c  2. Chronic systolic CHF, NICM - diuretics and CHF rx held on admit due to hypotension - in Re[facility] clip program, pharmacist checking numbers - today reading 29% so no further diuresis needed - however, pulm edema on CXR 09/18, MD advise on recheck - SBP 80s-100s, too low to restart rx - continue to follow I/O and daily wts - Lasix prn for now - I/O +1 L but are incomplete - need daily wts  3. Sepsis - 2nd to UTI - ?PNA on R as well but no cough - ABX per IM  4. Anemia - Hgb has been < 10 since 11/2019 - MCV has been slowly trending down, this admit < 90 till today - hx iron deficient - not currently on iron, will start NuIron 150 mg qd  5. AKI on CKD III - BUN/Cr 75/2.89 on admit - torsemide 80 mg am and 40 mg pm, metolazone 2.5 mg 2x wk and spiro12.5 mg held on admit - labs improved, watch volume  Otherwise, per IM Principal Problem:   Sepsis (HCC) Active Problems:   Nonischemic cardiomyopathy (HCC)   Obesity (BMI 30-39.9)   Essential hypertension   Hyperlipidemia   PAF (paroxysmal atrial fibrillation) (HCC)   ICD (implantable cardioverter-defibrillator) in place   Depression   Gout   Diabetes mellitus type 2 in obese (HCC)   Lactic acidosis   Elevated troponin   Acute on chronic kidney failure (HCC)   Anemia of chronic disease    Signed, Theodore Demark , PA-C  9:46 AM 08/18/2020 Pager: [phone]

## 2020-08-18 NOTE — Progress Notes (Signed)
° °  W/ recent fall, pt is at risk to have had arrhythmia.  Contacted [employer] to get ICD interrogated. Rep came and did it.   No arrhythmia or shocks. No sig arrhythmia.   Theodore Demark, PA-C 08/18/2020 5:25 PM

## 2020-08-18 NOTE — TOC Initial Note (Signed)
Transition of Care [facility]) - Initial/Assessment Note    Patient Details  Name: Ryan Macdonald MRN: [id number] Date of Birth: 11/16/1934  Transition of Care [facility]) CM/SW Contact:    Carley Hammed, LCSWA Phone Number: 08/18/2020, 3:44 PM  Clinical Narrative:                  CSW spoke with pt and spouse at bedside. Pt has VA and medicare A, will need to exhaust Med A before VA will kick in, family will need to be updated of this.Both are agreeable to SNF and note that pt has been to [facility] before, and they have a preference for it. Pt has had both Pfizer Covid vaccines. FL2 and faxout completed, will continue to follow for discharge.   Expected Discharge Plan: Skilled Nursing Facility Barriers to Discharge: Continued Medical Work up, SNF Pending bed offer   Patient Goals and CMS Choice Patient states their goals for this hospitalization and ongoing recovery are:: Pt an wife agreeable to SNF. CMS Medicare.gov Compare Post Acute Care list provided to:: Patient Represenative (must comment) (Spouse) Choice offered to / list presented to : Spouse  Expected Discharge Plan and Services Expected Discharge Plan: Skilled Nursing Facility       Living arrangements for the past 2 months: Single Family Home                                      Prior Living Arrangements/Services Living arrangements for the past 2 months: Single Family Home Lives with:: Spouse Patient language and need for interpreter reviewed:: Yes Do you feel safe going back to the place where you live?: Yes      Need for Family Participation in Patient Care: Yes (Comment) Care giver support system in place?: Yes (comment)   Criminal Activity/Legal Involvement Pertinent to Current Situation/Hospitalization: No - Comment as needed  Activities of Daily Living      Permission Sought/Granted Permission sought to share information with : Family Supports    Share Information with NAME: Ryan Macdonald      Permission granted to share info w Relationship: spouse  Permission granted to share info w Contact Information: [phone]  Emotional Assessment Appearance:: Appears stated age Attitude/Demeanor/Rapport: Engaged Affect (typically observed): Pleasant Orientation: : Oriented to Self, Oriented to Place, Oriented to Situation Alcohol / Substance Use: Not Applicable Psych Involvement: No (comment)  Admission diagnosis:  Sepsis (HCC) [A41.9] Patient Active Problem List   Diagnosis Date Noted  . Sepsis (HCC) 08/16/2020  . Hematuria 02/15/2020  . CKD (chronic kidney disease), stage III 02/15/2020  . Constipation 02/13/2020  . Anemia of chronic disease 02/13/2020  . Secondary hypercoagulable state (HCC) 01/16/2020  . AKI (acute kidney injury) (HCC) 03/14/2019  . Spasm of muscle of lower back 03/14/2019  . Acute renal failure (ARF) (HCC) 03/09/2019  . Transaminitis   . Acute on chronic kidney failure (HCC) 03/08/2019  . Acute exacerbation of CHF (congestive heart failure) (HCC) 11/23/2018  . Acute on chronic heart failure (HCC) 10/19/2018  . Pulmonary emphysema (HCC)   . Chronic respiratory failure with hypoxia (HCC)   . Morbid obesity (HCC) 09/09/2018  . URI (upper respiratory infection) 08/31/2018  . UTI (urinary tract infection) 08/31/2018  . Chronic systolic (congestive) heart failure (HCC) 08/28/2018  . CHF (congestive heart failure) (HCC) 08/14/2018  . Acute on chronic combined systolic and diastolic CHF (congestive heart failure) (HCC) 07/18/2018  .  First degree AV block   . Shortness of breath 04/29/2018  . Acute HFrEF (heart failure with reduced ejection fraction) (HCC) 04/29/2018  . Acute respiratory failure with hypoxia (HCC) 09/27/2017  . CHF exacerbation (HCC) 09/27/2017  . Renal insufficiency   . Acute on chronic systolic CHF (congestive heart failure) (HCC) 07/08/2017  . Left knee pain 07/08/2017  . Acute bronchitis 05/10/2017  . Acute congestive heart failure  (HCC) 04/14/2017  . Elevated troponin 04/14/2017  . Lactic acidosis   . Acute on chronic systolic heart failure, NYHA class 2 (HCC) 02/03/2017  . Diabetes mellitus type 2 in obese (HCC) 02/03/2017  . CAP (community acquired pneumonia) 02/03/2017  . Lobar pneumonia (HCC)   . Diabetes mellitus with complication (HCC)   . Acute on chronic respiratory failure with hypoxia (HCC)   . Acute on chronic combined systolic and diastolic heart failure (HCC) 05/19/2016  . Depression 05/19/2016  . Gout 05/19/2016  . 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 NSVT (nonsustained ventricular tachycardia) (HCC)   . Chronic renal insufficiency, stage 3 (moderate) (HCC)   . Hyperkalemia 08/21/2015  . Pulmonary embolism (HCC)   . Acute respiratory failure (HCC)   . Osteoarthritis of left hip 10/25/2013  . PAF (paroxysmal atrial fibrillation) (HCC) 10/25/2013  . S/P hip replacement 10/24/2013  . Nonischemic cardiomyopathy (HCC)   . Obesity (BMI 30-39.9)   . Essential hypertension   . Hyperlipidemia    PCP:  [facility]:   [facility] - [place], [place] - [address] [place] [address] Phone: [phone] Fax: [phone]  [id number] [place], [place] - [address] AT [facility] [address] [place] [address] Phone: [phone] Fax: [phone]  [id number] - [place], [place] - [address] AT [facility] [address] [place] [address] Phone: [phone] Fax: [phone]  [facility] - [place], [place] - [address] [facility] [address] [place] [address] Phone: [phone] Fax: [phone]     Social Determinants of Health (SDOH) Interventions    Readmission Risk Interventions No flowsheet data found.

## 2020-08-18 NOTE — Progress Notes (Signed)
[facility] PROGRESS NOTE   Heliodoro Goris [id number]:[id number] DOB: 1934/06/08 DOA: 08/16/2020  PCP: [facility]  Brief History/Interval Summary: 86 y.o. male with medical history significant of paroxysmal A. fib-on Eliquis, PE, type 2 diabetes mellitus, hypertension, hyperlipidemia, nonischemic cardiomyopathy with ejection fraction of 20 to 25% status post AICD placement, CKD stage III, gout, obesity, hypothyroidism brought by EMS for evaluation of fall and sepsis.  In the ED patient was noted to be tachycardic tachypneic with soft blood pressure.  Abnormal UA was noted.  He was found to have elevated troponin.  Started on antibiotics and hospitalized for further management.  Reason for Visit: Urinary tract infection.  Mechanical fall.  Consultants: Cardiology  Procedures: None yet  Antibiotics: Anti-infectives (From admission, onward)   Start     Dose/Rate Route Frequency Ordered Stop   08/17/20 1200  cefTRIAXone (ROCEPHIN) 2 g in sodium chloride 0.9 % 100 mL IVPB        2 g 200 mL/hr over 30 Minutes Intravenous Every 24 hours 08/16/20 1344     08/17/20 1200  azithromycin (ZITHROMAX) 500 mg in sodium chloride 0.9 % 250 mL IVPB        500 mg 250 mL/hr over 60 Minutes Intravenous Every 24 hours 08/16/20 1344     08/16/20 1115  cefTRIAXone (ROCEPHIN) 2 g in sodium chloride 0.9 % 100 mL IVPB        2 g 200 mL/hr over 30 Minutes Intravenous  Once 08/16/20 1107 08/16/20 1155   08/16/20 1115  azithromycin (ZITHROMAX) 500 mg in sodium chloride 0.9 % 250 mL IVPB        500 mg 250 mL/hr over 60 Minutes Intravenous  Once 08/16/20 1112 08/16/20 1416      Subjective/Interval History: Patient states that he is feeling slightly better today.  Denies any chest pain shortness of breath lightheadedness or dizziness.  Pain in the left leg seems to be better.       Assessment/Plan:  Severe sepsis likely septic shock secondary to UTI Chest x-ray reported as possibly showing  pneumonia on the right side but patient does not have any cough.  No shortness of breath currently.  Unlikely to be an infectious process.   However his urine was noted to be abnormal.  Urine culture is growing coli and Enterococcus faecalis.  Await final sensitivities.   Continue ceftriaxone for now.  Also on azithromycin. Follow-up on blood cultures as well.  Procalcitonin elevated at 1.24.  Lactic acid was elevated at 4.4.  Improved to 2.4.  Fall/pain in the left lower extremity Per patient he did not have any syncopal episode.  He apparently slid off his bed.  Likely a mechanical fall.  Denies any pain but he did have limited range of motion of his left lower extremity.  X-ray was done yesterday which showed previous arthroplasty but no acute findings.  He has better mobility today.  PT and OT.   CT head did not show any acute findings.  Elevated troponin in the setting of known coronary artery disease Troponins have climbed up significantly.  He denies any chest pain.  EKG shows findings seen previously as well with T inversion in 1 aVL.  Patient with significant cardiac history including chronic CHF.   This could be demand ischemia but cannot rule out NSTEMI.  He does have known CAD with PCI in 2019.  Cardiac medications on hold due to low blood pressure.  Cardiology was consulted to assist with management.  History of chronic systolic CHF status post AICD Patient with history of nonischemic cardiomyopathy.  Strict ins and outs and daily weights.  Recently seen by heart failure clinic.  It looks like he was on torsemide 80 mg twice a day.  He was also asked to take metolazone more frequently than once a week because he was experiencing lower extremity edema.   Cardiac medications on hold due to low blood pressure.  Cardiology following.    Acute on chronic kidney disease stage IIIb/hypokalemia Patient's baseline creatinine is around 1.9.  He came in with a BUN of 75 and creatinine of 2.89.   Due to sepsis as well as recent increased diuretic use. Diuretics were placed on hold.  He was gently hydrated per sepsis protocol.  Renal function has improved.  BUN is down to 51 and creatinine is down to 1.9 today.  Replace his potassium.  Magnesium was 2.0 2 days ago.  We will recheck tomorrow morning.  Monitor urine output.  Paroxysmal atrial fibrillation On Eliquis and amiodarone which are being continued.  Hypothyroidism Continue with levothyroxine.  Essential hypertension Noted to have borderline low blood pressures.  Holding his antihypertensives.  Monitor closely.  He is asymptomatic.  Normocytic anemia Baseline hemoglobin noted between 8 and 9.  Hemoglobin dropped down to 7.7 yesterday likely dilutional.  No overt bleeding.  Noted to be 7.9 today.  Continue to monitor.    Hyperlipidemia Continue statin.  History of depression Continue Zoloft.  History of gout Holding allopurinol  History of BPH Holding his tamsulosin and finasteride.  These were held due to hypotension.    Obesity Estimated body mass index is 30.49 kg/m as calculated from the following:   Height as of this encounter: 5\' 7"  (1.702 m).   Weight as of this encounter: 88.3 kg.   DVT Prophylaxis: On Eliquis Code Status: Full code Family Communication: Discussed with the patient Disposition Plan: Lives with his wife.  Hopefully return home when improved.  PT and OT evaluation.  Status is: Inpatient  Remains inpatient appropriate because:IV treatments appropriate due to intensity of illness or inability to take PO and Inpatient level of care appropriate due to severity of illness   Dispo: The patient is from: Home              Anticipated d/c is to: Home              Anticipated d/c date is: 3 days              Patient currently is not medically stable to d/c.     Medications:  Scheduled: . amiodarone  200 mg Oral Daily  . apixaban  2.5 mg Oral BID  . levothyroxine  125 mcg Oral QAC  breakfast  . polyethylene glycol  17 g Oral BID  . rosuvastatin  10 mg Oral q1800  . senna-docusate  1 tablet Oral BID  . sertraline  25 mg Oral Daily   Continuous: . azithromycin 500 mg (08/17/20 1238)  . cefTRIAXone (ROCEPHIN)  IV 2 g (08/17/20 1158)   08/19/20 **OR** acetaminophen, ipratropium-albuterol, ondansetron **OR** ondansetron (ZOFRAN) IV   Objective:  Vital Signs  Vitals:   08/17/20 2351 08/18/20 0410 08/18/20 0715 08/18/20 0825  BP: (!) 92/58 99/63 104/62 103/62  Pulse: 79 79 77 77  Resp: 20 20 18 20   Temp: 98 F (36.7 C) 98.4 F (36.9 C)  (!) 97.4 F (36.3 C)  TempSrc: Oral Oral  Oral  SpO2: 97% 97%  95% 99%  Weight:      Height:        Intake/Output Summary (Last 24 hours) at 08/18/2020 0910 Last data filed at 08/18/2020 0500 Gross per 24 hour  Intake 120 ml  Output 400 ml  Net -280 ml   Filed Weights   08/16/20 1024  Weight: 88.3 kg    General appearance: Awake alert.  In no distress Resp: Clear to auscultation bilaterally.  Normal effort Cardio: S1-S2 is normal regular.  No S3-S4.  No rubs murmurs or bruit GI: Abdomen is soft.  Nontender nondistended.  Bowel sounds are present normal.  No masses organomegaly Extremities: Improved range of motion of the left leg.  Minimal edema. Neurologic: No obvious focal deficits..    Lab Results:  Data Reviewed: I have personally reviewed following labs and imaging studies  CBC: Recent Labs  Lab 08/16/20 0948 08/16/20 1611 08/17/20 0716 08/18/20 0144  WBC 7.3 7.3 7.1 7.1  NEUTROABS 6.0  --   --   --   HGB 8.2* 8.0* 7.7* 7.9*  HCT 26.1* 25.5* 25.6* 25.4*  MCV 89.4 87.6 89.8 90.4  PLT 254 211 214 216    Basic Metabolic Panel: Recent Labs  Lab 08/13/20 1022 08/16/20 0948 08/16/20 1611 08/17/20 0716 08/18/20 0144  NA 136 135  --  139 140  K 3.6 3.5  --  3.2* 3.4*  CL 98 97*  --  104 106  CO2 25 21*  --  25 26  GLUCOSE 115* 158*  --  94 109*  BUN 49* 75*  --  62* 51*  CREATININE  2.14* 2.89* 2.38* 2.17* 1.91*  CALCIUM 9.2 9.0  --  8.6* 8.7*  MG  --   --  2.0  --   --   PHOS  --   --  4.0  --   --     GFR: Estimated Creatinine Clearance: 30.5 mL/min (A) (by C-G formula based on SCr of 1.91 mg/dL (H)).  Liver Function Tests: Recent Labs  Lab 08/16/20 0948 08/17/20 0716 08/18/20 0144  AST 82* 94* 87*  ALT ALKPHOS 76 63 64  BILITOT 1.0 1.1 0.8  PROT 7.8 7.1 7.0  ALBUMIN 2.2* 1.9* 1.9*    Coagulation Profile: Recent Labs  Lab 08/16/20 0948 08/17/20 0716  INR 1.8* 1.8*    Cardiac Enzymes: Recent Labs  Lab 08/16/20 1150  CKTOTAL 732*    CBG: Recent Labs  Lab 08/16/20 0942 08/17/20 0033  GLUCAP 162* 101*    Thyroid Function Tests: Recent Labs    08/16/20 1611  TSH 1.475     Recent Results (from the past 240 hour(s))  SARS Coronavirus 2 by RT PCR (hospital order, performed in [facility] hospital lab) Nasopharyngeal Nasopharyngeal Swab     Status: None   Collection Time: 08/16/20  9:40 AM   Specimen: Nasopharyngeal Swab  Result Value Ref Range Status   SARS Coronavirus 2 NEGATIVE NEGATIVE Final    Comment: (NOTE) SARS-CoV-2 target nucleic acids are NOT DETECTED.  The SARS-CoV-2 RNA is generally detectable in upper and lower respiratory specimens during the acute phase of infection. The lowest concentration of SARS-CoV-2 viral copies this assay can detect is 250 copies / mL. A negative result does not preclude SARS-CoV-2 infection and should not be used as the sole basis for treatment or other patient management decisions.  A negative result may occur with improper specimen collection / handling, submission of specimen other than nasopharyngeal swab, presence  of viral mutation(s) within the areas targeted by this assay, and inadequate number of viral copies (<250 copies / mL). A negative result must be combined with clinical observations, patient history, and epidemiological information.  Fact Sheet for Patients:     BoilerBrush.com.cy  Fact Sheet for Healthcare Providers: https://pope.com/  This test is not yet approved or  cleared by the [place] FDA and has been authorized for detection and/or diagnosis of SARS-CoV-2 by FDA under an Emergency Use Authorization (EUA).  This EUA will remain in effect (meaning this test can be used) for the duration of the COVID-19 declaration under Section 564(b)(1) of the Act, 21 U.S.C. section 360bbb-3(b)(1), unless the authorization is terminated or revoked sooner.  Performed at [facility], [address], [place], [place] [address]   Blood culture (routine single)     Status: None (Preliminary result)   Collection Time: 08/16/20 10:04 AM   Specimen: BLOOD RIGHT FOREARM  Result Value Ref Range Status   Specimen Description BLOOD RIGHT FOREARM  Final   Special Requests   Final    BOTTLES DRAWN AEROBIC AND ANAEROBIC Blood Culture results may not be optimal due to an excessive volume of blood received in culture bottles   Culture   Final    NO GROWTH 1 DAY Performed at [facility], [address] [place], [place] [address]    Report Status PENDING  Incomplete  Urine culture     Status: Abnormal (Preliminary result)   Collection Time: 08/16/20 12:40 PM   Specimen: In/Out Cath Urine  Result Value Ref Range Status   Specimen Description IN/OUT CATH URINE  Final   Special Requests NONE  Final   Culture (A)  Final    >=100,000 COLONIES/mL ESCHERICHIA COLI >=100,000 COLONIES/mL ENTEROCOCCUS FAECALIS SUSCEPTIBILITIES TO FOLLOW Performed at [facility], [address] [place], [place] [address]    Report Status PENDING  Incomplete  Culture, blood (single) w Reflex to ID Panel     Status: None (Preliminary result)   Collection Time: 08/16/20  4:11 PM   Specimen: BLOOD LEFT HAND  Result Value Ref Range Status   Specimen Description BLOOD LEFT HAND  Final   Special Requests   Final     BOTTLES DRAWN AEROBIC ONLY Blood Culture adequate volume   Culture   Final    NO GROWTH < 24 HOURS Performed at [facility], [address], [place], [place] [address]    Report Status PENDING  Incomplete  Culture, blood (routine x 2)     Status: None (Preliminary result)   Collection Time: 08/16/20  4:34 PM   Specimen: BLOOD  Result Value Ref Range Status   Specimen Description BLOOD RIGHT ANTECUBITAL  Final   Special Requests AEROBIC BOTTLE ONLY Blood Culture adequate volume  Final   Culture   Final    NO GROWTH < 24 HOURS Performed at [facility], [address], [place], [place] [address]    Report Status PENDING  Incomplete      Radiology Studies: CT Head Wo Contrast  Result Date: 08/16/2020 CLINICAL DATA:  Delirium and fall EXAM: CT HEAD WITHOUT CONTRAST TECHNIQUE: Contiguous axial images were obtained from the base of the skull through the vertex without intravenous contrast. COMPARISON:  None. FINDINGS: Brain: No evidence of acute infarction, hemorrhage, hydrocephalus, extra-axial collection or mass lesion/mass effect. Periventricular white matter hypodensities consistent with sequela of chronic microvascular ischemic disease. Vascular: Vascular calcifications. Skull: Normal.  Negative for fracture or focal lesion. Sinuses/Orbits: No acute finding. Other: None. IMPRESSION: 1.  No acute intracranial abnormality. Electronically Signed   By: Meda Klinefelter MD   On: 08/16/2020 11:01   DG Chest Port 1 View  Result Date: 08/16/2020 CLINICAL DATA:  Delirium EXAM: PORTABLE CHEST 1 VIEW COMPARISON:  August 08, 2020 FINDINGS: The cardiomediastinal silhouette is unchanged and enlarged in contour.Evaluation is limited secondary to patient rotation. LEFT chest AICD. Small RIGHT pleural effusion. No pneumothorax. Perihilar vascular fullness with diffuse interstitial opacities, similar comparison to prior. Persistent heterogeneous opacity of the RIGHT lung base. Visualized  abdomen is unremarkable. Multilevel degenerative changes of the thoracic spine. IMPRESSION: 1. Cardiomegaly with pulmonary edema and small RIGHT pleural effusion. 2. Persistent heterogeneous opacity of the RIGHT lung base. Differential considerations include infection versus atelectasis. Electronically Signed   By: Meda Klinefelter MD   On: 08/16/2020 11:03   ECHOCARDIOGRAM COMPLETE  Result Date: 08/17/2020    ECHOCARDIOGRAM REPORT   Patient Name:   Ryan Macdonald Date of Exam: 08/17/2020 Medical Rec #:  [id number]      Height:       67.0 in Accession #:    [id number]     Weight:       194.7 lb Date of Birth:  [birth year]      BSA:          1.999 m Patient Age:    86 years       BP:           99/58 mmHg Patient Gender: M              HR:           82 bpm. Exam Location:  Inpatient Procedure: 2D Echo, Cardiac Doppler and Color Doppler Indications:    Elevated Troponin  History:        Patient has prior history of Echocardiogram examinations, most                 recent 03/27/2020. Cardiomyopathy and CHF, Arrythmias:Atrial                 Fibrillation, Signs/Symptoms:Dyspnea; Risk Factors:Hypertension                 and Diabetes.  Sonographer:    Eulah Pont RDCS Referring Phys: [id number] Corrin Parker  Sonographer Comments: Technically difficult study due to poor echo windows. IMPRESSIONS  1. Left ventricular ejection fraction, by estimation, is 20 to 25%. The left ventricle has severely decreased function. The left ventricle demonstrates global hypokinesis. The left ventricular internal cavity size was severely dilated. Left ventricular diastolic parameters are indeterminate.  2. Right ventricular systolic function is mildly reduced. The right ventricular size is normal. There is normal pulmonary artery systolic pressure. The estimated right ventricular systolic pressure is 19.4 mmHg.  3. Left atrial size was moderately dilated.  4. The mitral valve is normal in structure. Mild mitral valve regurgitation. No  evidence of mitral stenosis.  5. The aortic valve has an indeterminant number of cusps. There is severe calcifcation of the aortic valve. There is severe thickening of the aortic valve. Aortic valve regurgitation is not visualized. There is at least Moderate aortic valve stenosis and possibly severe by calculated AVA but the mean and peak transaortic gradients are low due to low flow low output AS. SVI is low at 21. Aortic valve area, by VTI measures 1.27 cm. Aortic valve mean gradient measures 6.0 mmHg.  6. The inferior  vena cava is dilated in size with >50% respiratory variability, suggesting right atrial pressure of 8 mmHg. FINDINGS  Left Ventricle: Left ventricular ejection fraction, by estimation, is 20 to 25%. The left ventricle has severely decreased function. The left ventricle demonstrates global hypokinesis. The left ventricular internal cavity size was severely dilated. There is no left ventricular hypertrophy of the basal-septal segment. Left ventricular diastolic function could not be evaluated due to atrial fibrillation. Left ventricular diastolic parameters are indeterminate. Right Ventricle: The right ventricular size is normal. No increase in right ventricular wall thickness. Right ventricular systolic function is mildly reduced. There is normal pulmonary artery systolic pressure. The tricuspid regurgitant velocity is 1.69 m/s, and with an assumed right atrial pressure of 8 mmHg, the estimated right ventricular systolic pressure is 19.4 mmHg. Left Atrium: Left atrial size was moderately dilated. Right Atrium: Right atrial size was normal in size. Pericardium: There is no evidence of pericardial effusion. Mitral Valve: The mitral valve is normal in structure. Mild mitral valve regurgitation. No evidence of mitral valve stenosis. Tricuspid Valve: The tricuspid valve is normal in structure. Tricuspid valve regurgitation is trivial. No evidence of tricuspid stenosis. Aortic Valve: The aortic valve has  an indeterminant number of cusps. There is severe calcifcation of the aortic valve. There is severe thickening of the aortic valve. Aortic valve regurgitation is not visualized. Moderate aortic stenosis is present. Aortic valve mean gradient measures 6.0 mmHg. Aortic valve peak gradient measures 12.0 mmHg. Aortic valve area, by VTI measures 1.27 cm. Pulmonic Valve: The pulmonic valve was normal in structure. Pulmonic valve regurgitation is not visualized. No evidence of pulmonic stenosis. Aorta: The aortic root is normal in size and structure. Venous: The inferior vena cava is dilated in size with greater than 50% respiratory variability, suggesting right atrial pressure of 8 mmHg. IAS/Shunts: No atrial level shunt detected by color flow Doppler. Additional Comments: A pacer wire is visualized.  LEFT VENTRICLE PLAX 2D LVIDd:         7.50 cm LVIDs:         6.60 cm LV PW:         1.00 cm LV IVS:        0.90 cm LVOT diam:     1.90 cm LV SV:         42 LV SV Index:   21 LVOT Area:     2.84 cm  LV Volumes (MOD) LV vol d, MOD A4C: 221.0 ml LV vol s, MOD A4C: 150.0 ml LV SV MOD A4C:     221.0 ml RIGHT VENTRICLE RV S prime:     7.05 cm/s TAPSE (M-mode): 1.3 cm LEFT ATRIUM              Index       RIGHT ATRIUM           Index LA diam:        4.30 cm  2.15 cm/m  RA Area:     17.30 cm LA Vol (A2C):   103.0 ml 51.52 ml/m RA Volume:   50.00 ml  25.01 ml/m LA Vol (A4C):   74.7 ml  37.37 ml/m LA Biplane Vol: 88.9 ml  44.47 ml/m  AORTIC VALVE AV Area (Vmax):    1.48 cm AV Area (Vmean):   1.38 cm AV Area (VTI):     1.27 cm AV Vmax:           173.00 cm/s AV Vmean:  116.000 cm/s AV VTI:            0.328 m AV Peak Grad:      12.0 mmHg AV Mean Grad:      6.0 mmHg LVOT Vmax:         90.60 cm/s LVOT Vmean:        56.300 cm/s LVOT VTI:          0.147 m LVOT/AV VTI ratio: 0.45  AORTA Ao Root diam: 3.50 cm Ao Asc diam:  3.60 cm MR Peak grad: 58.7 mmHg   TRICUSPID VALVE MR Mean grad: 34.0 mmHg   TR Peak grad:   11.4 mmHg MR  Vmax:      383.00 cm/s TR Vmax:        169.00 cm/s MR Vmean:     273.0 cm/s                           SHUNTS                           Systemic VTI:  0.15 m                           Systemic Diam: 1.90 cm Armanda Magic MD Electronically signed by Armanda Magic MD Signature Date/Time: 08/17/2020/4:32:52 PM    Final    DG HIP PORT UNILAT WITH PELVIS 1V LEFT  Result Date: 08/17/2020 CLINICAL DATA:  Pain in LEFT hip EXAM: DG HIP (WITH OR WITHOUT PELVIS) 1V PORT LEFT COMPARISON:  March 14, 2019 FINDINGS: Status post LEFT hip arthroplasty. Orthopedic hardware is intact and without periprosthetic fracture or lucency. There is partial bridging heterotopic ossifications along the LEFT superior implant, unchanged in comparison to prior. Osteopenia. No acute fracture or dislocation. Mild degenerative changes of the RIGHT hip. Degenerative changes of the lower lumbar spine. Transitional anatomy with a LEFT-sided assimilation joint at L5-S1. No area of erosion or osseous destruction. No unexpected radiopaque foreign body. Vascular calcifications. IMPRESSION: 1. Status post LEFT hip arthroplasty without evidence of hardware complication. 2. No acute osseous abnormality. Electronically Signed   By: Meda Klinefelter MD   On: 08/17/2020 11:09       LOS: 2 days   Osvaldo Shipper  [facility] Pager on [email or web address]  08/18/2020, 9:10 AM

## 2020-08-18 NOTE — [place] FL2 (Signed)
[place] MEDICAID FL2 LEVEL OF CARE SCREENING TOOL     IDENTIFICATION  Patient Name: Ryan Macdonald Birthdate: 1934-02-09 Sex: male Admission Date (Current Location): 08/16/2020  [facility] and [place] Number:  Producer, television/film/video and Address:  [facility] [place]. [facility], [address] [place], [place] [address]      Provider Number: [id number]  Attending Physician Name and Address:  Osvaldo Shipper, MD  Relative Name and Phone Number:  Zaquan Duffner, [phone]    Current Level of Care: Hospital Recommended Level of Care: Skilled Nursing Facility Prior Approval Number:    Date Approved/Denied:   PASRR Number: [id number]  Discharge Plan: SNF    Current Diagnoses: Patient Active Problem List   Diagnosis Date Noted  . Sepsis (HCC) 08/16/2020  . Hematuria 02/15/2020  . CKD (chronic kidney disease), stage III 02/15/2020  . Constipation 02/13/2020  . Anemia of chronic disease 02/13/2020  . Secondary hypercoagulable state (HCC) 01/16/2020  . AKI (acute kidney injury) (HCC) 03/14/2019  . Spasm of muscle of lower back 03/14/2019  . Acute renal failure (ARF) (HCC) 03/09/2019  . Transaminitis   . Acute on chronic kidney failure (HCC) 03/08/2019  . Acute exacerbation of CHF (congestive heart failure) (HCC) 11/23/2018  . Acute on chronic heart failure (HCC) 10/19/2018  . Pulmonary emphysema (HCC)   . Chronic respiratory failure with hypoxia (HCC)   . Morbid obesity (HCC) 09/09/2018  . URI (upper respiratory infection) 08/31/2018  . UTI (urinary tract infection) 08/31/2018  . Chronic systolic (congestive) heart failure (HCC) 08/28/2018  . CHF (congestive heart failure) (HCC) 08/14/2018  . Acute on chronic combined systolic and diastolic CHF (congestive heart failure) (HCC) 07/18/2018  . First degree AV block   . Shortness of breath 04/29/2018  . Acute HFrEF (heart failure with reduced ejection fraction) (HCC) 04/29/2018  . Acute respiratory failure with  hypoxia (HCC) 09/27/2017  . CHF exacerbation (HCC) 09/27/2017  . Renal insufficiency   . Acute on chronic systolic CHF (congestive heart failure) (HCC) 07/08/2017  . Left knee pain 07/08/2017  . Acute bronchitis 05/10/2017  . Acute congestive heart failure (HCC) 04/14/2017  . Elevated troponin 04/14/2017  . Lactic acidosis   . Acute on chronic systolic heart failure, NYHA class 2 (HCC) 02/03/2017  . Diabetes mellitus type 2 in obese (HCC) 02/03/2017  . CAP (community acquired pneumonia) 02/03/2017  . Lobar pneumonia (HCC)   . Diabetes mellitus with complication (HCC)   . Acute on chronic respiratory failure with hypoxia (HCC)   . Acute on chronic combined systolic and diastolic heart failure (HCC) 05/19/2016  . Depression 05/19/2016  . Gout 05/19/2016  . 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 NSVT (nonsustained ventricular tachycardia) (HCC)   . Chronic renal insufficiency, stage 3 (moderate) (HCC)   . Hyperkalemia 08/21/2015  . Pulmonary embolism (HCC)   . Acute respiratory failure (HCC)   . Osteoarthritis of left hip 10/25/2013  . PAF (paroxysmal atrial fibrillation) (HCC) 10/25/2013  . S/P hip replacement 10/24/2013  . Nonischemic cardiomyopathy (HCC)   . Obesity (BMI 30-39.9)   . Essential hypertension   . Hyperlipidemia     Orientation RESPIRATION BLADDER Height & Weight     Self, Situation, Place  Normal External catheter Weight: 194 lb 10.7 oz (88.3 kg) Height:  5\' 7"  (170.2 cm)  BEHAVIORAL SYMPTOMS/MOOD NEUROLOGICAL BOWEL NUTRITION STATUS      Continent Diet (See discharge summary)  AMBULATORY STATUS COMMUNICATION OF NEEDS Skin   Limited Assist Verbally Normal  Personal Care Assistance Level of Assistance  Bathing, Feeding, Dressing Bathing Assistance: Maximum assistance Feeding assistance: Independent Dressing Assistance: Maximum assistance     Functional Limitations Info  Sight, Hearing, Speech Sight  Info: Adequate Hearing Info: Adequate Speech Info: Adequate    SPECIAL CARE FACTORS FREQUENCY  PT (By licensed PT), OT (By licensed OT)     PT Frequency: 5x week OT Frequency: 5x week            Contractures Contractures Info: Not present    Additional Factors Info  Code Status, Allergies, Psychotropic Code Status Info: Full Allergies Info: Lisinopril, Xarelto Psychotropic Info: Zoloft         Current Medications (08/18/2020):  This is the current hospital active medication list Current Facility-Administered Medications  Medication Dose Route Frequency Provider Last Rate Last Admin  . acetaminophen (TYLENOL) tablet 650 mg  650 mg Oral Q6H PRN Pahwani, Rinka R, MD       Or  . acetaminophen (TYLENOL) suppository 650 mg  650 mg Rectal Q6H PRN Pahwani, Rinka R, MD      . amiodarone (PACERONE) tablet 200 mg  200 mg Oral Daily Pahwani, Rinka R, MD   200 mg at 08/18/20 1019  . amoxicillin (AMOXIL) capsule 500 mg  500 mg Oral Q12H Osvaldo Shipper, MD   500 mg at 08/18/20 1246  . apixaban (ELIQUIS) tablet 2.5 mg  2.5 mg Oral BID Pahwani, Rinka R, MD   2.5 mg at 08/18/20 1019  . ipratropium-albuterol (DUONEB) 0.5-2.5 (3) MG/3ML nebulizer solution 3 mL  3 mL Nebulization Q4H PRN Pahwani, Rinka R, MD      . iron polysaccharides (NIFEREX) capsule 150 mg  150 mg Oral Daily Barrett, Rhonda G, PA-C   150 mg at 08/18/20 1320  . levothyroxine (SYNTHROID) tablet 125 mcg  125 mcg Oral QAC breakfast Pahwani, Rinka R, MD   125 mcg at 08/18/20 0524  . ondansetron (ZOFRAN) tablet 4 mg  4 mg Oral Q6H PRN Pahwani, Rinka R, MD       Or  . ondansetron (ZOFRAN) injection 4 mg  4 mg Intravenous Q6H PRN Pahwani, Rinka R, MD      . polyethylene glycol (MIRALAX / GLYCOLAX) packet 17 g  17 g Oral BID Pahwani, Rinka R, MD   17 g at 08/17/20 2206  . rosuvastatin (CRESTOR) tablet 10 mg  10 mg Oral q1800 Pahwani, Rinka R, MD   10 mg at 08/17/20 1636  . senna-docusate (Senokot-S) tablet 1 tablet  1 tablet Oral  BID Pahwani, Rinka R, MD   1 tablet at 08/18/20 1019  . sertraline (ZOLOFT) tablet 25 mg  25 mg Oral Daily Pahwani, Rinka R, MD   25 mg at 08/18/20 1019     Discharge Medications: Please see discharge summary for a list of discharge medications.  Relevant Imaging Results:  Relevant Lab Results:   Additional Information SS# [id number], [address]

## 2020-08-18 NOTE — Progress Notes (Signed)
  REDS Clip  READING= 29%  CHEST RULER = 35 Clip Station = D   Sherron Monday, PharmD, BCCCP Clinical Pharmacist  Phone: [phone] 08/18/2020 7:48 AM  Please check AMION for all [facility] Pharmacy phone numbers After 10:00 PM, call Main Pharmacy [phone]

## 2020-08-19 DIAGNOSIS — N390 Urinary tract infection, site not specified: Secondary | ICD-10-CM

## 2020-08-19 LAB — COMPREHENSIVE METABOLIC PANEL
ALT: 27 U/L (ref 0–44)
AST: 71 U/L — ABNORMAL HIGH (ref 15–41)
Albumin: 1.9 g/dL — ABNORMAL LOW (ref 3.5–5.0)
Alkaline Phosphatase: 67 U/L (ref 38–126)
Anion gap: 8 (ref 5–15)
BUN: 45 mg/dL — ABNORMAL HIGH (ref 8–23)
CO2: 25 mmol/L (ref 22–32)
Calcium: 8.8 mg/dL — ABNORMAL LOW (ref 8.9–10.3)
Chloride: 105 mmol/L (ref 98–111)
Creatinine, Ser: 1.81 mg/dL — ABNORMAL HIGH (ref 0.61–1.24)
GFR calc Af Amer: 39 mL/min — ABNORMAL LOW (ref >60)
GFR calc non Af Amer: 34 mL/min — ABNORMAL LOW (ref >60)
Glucose, Bld: 123 mg/dL — ABNORMAL HIGH (ref 70–99)
Potassium: 4.2 mmol/L (ref 3.5–5.1)
Sodium: 138 mmol/L (ref 135–145)
Total Bilirubin: 1.1 mg/dL (ref 0.3–1.2)
Total Protein: 7 g/dL (ref 6.5–8.1)

## 2020-08-19 LAB — MAGNESIUM: Magnesium: 2 mg/dL (ref 1.7–2.4)

## 2020-08-19 LAB — CBC
HCT: 26.7 % — ABNORMAL LOW (ref 39.0–52.0)
Hemoglobin: 8 g/dL — ABNORMAL LOW (ref 13.0–17.0)
MCH: 27.7 pg (ref 26.0–34.0)
MCHC: 30 g/dL (ref 30.0–36.0)
MCV: 92.4 fL (ref 80.0–100.0)
Platelets: 228 10*3/uL (ref 150–400)
RBC: 2.89 MIL/uL — ABNORMAL LOW (ref 4.22–5.81)
RDW: 26 % — ABNORMAL HIGH (ref 11.5–15.5)
WBC: 7.3 10*3/uL (ref 4.0–10.5)
nRBC: 0 % (ref 0.0–0.2)

## 2020-08-19 MED ORDER — MELATONIN 3 MG PO TABS
3.0000 mg | ORAL_TABLET | Freq: Every evening | ORAL | Status: DC | PRN
  Filled 2020-08-19: qty 1

## 2020-08-19 MED ORDER — AMOXICILLIN 500 MG PO CAPS
500.0000 mg | ORAL_CAPSULE | Freq: Once | ORAL | Status: AC
  Administered 2020-08-19: 500 mg via ORAL
  Filled 2020-08-19: qty 1

## 2020-08-19 MED ORDER — FINASTERIDE 5 MG PO TABS
5.0000 mg | ORAL_TABLET | Freq: Every day | ORAL | Status: DC
  Administered 2020-08-19 – 2020-08-21 (×3): 5 mg via ORAL
  Filled 2020-08-19 (×3): qty 1

## 2020-08-19 MED ORDER — DICLOFENAC SODIUM 1 % EX GEL
4.0000 g | Freq: Two times a day (BID) | CUTANEOUS | Status: DC | PRN
  Filled 2020-08-19: qty 100

## 2020-08-19 MED ORDER — AMOXICILLIN 500 MG PO CAPS
500.0000 mg | ORAL_CAPSULE | Freq: Three times a day (TID) | ORAL | Status: DC
  Administered 2020-08-19 – 2020-08-21 (×5): 500 mg via ORAL
  Filled 2020-08-19 (×5): qty 1

## 2020-08-19 MED ORDER — LIDOCAINE 5 % EX PTCH
1.0000 | MEDICATED_PATCH | CUTANEOUS | Status: DC
  Administered 2020-08-19 – 2020-08-21 (×3): 1 via TRANSDERMAL
  Filled 2020-08-19 (×3): qty 1

## 2020-08-19 NOTE — TOC Progression Note (Signed)
Transition of Care [facility]) - Progression Note    Patient Details  Name: Ryan Macdonald MRN: [id number] Date of Birth: [birth year]  Transition of Care [facility]) CM/SW Contact  Baldemar Lenis, [place] Phone Number: 08/19/2020, 12:12 PM  Clinical Narrative:   CSW confirmed bed availability at [facility], pending medical stability. CSW to follow.    Expected Discharge Plan: Skilled Nursing Facility Barriers to Discharge: Continued Medical Work up, SNF Pending bed offer  Expected Discharge Plan and Services Expected Discharge Plan: Skilled Nursing Facility       Living arrangements for the past 2 months: Single Family Home                                       Social Determinants of Health (SDOH) Interventions    Readmission Risk Interventions No flowsheet data found.

## 2020-08-19 NOTE — Discharge Instructions (Signed)

## 2020-08-19 NOTE — Progress Notes (Signed)
Pt found to have medication at the bedside that pt states his wife brought to him. Medication was in a zip lock bag. AD and CN notified of pt having medication at his bedside. Pt wife called and educated on not bringing the pt medication from home. Pt is alert and shows no signs or symptoms of distress. Staff will continue to monitor. CN put medication up for wife to return and take back home.

## 2020-08-19 NOTE — Evaluation (Signed)
Occupational Therapy Evaluation Patient Details Name: Ryan Macdonald MRN: [id number] DOB: October 09, 1934 Today's Date: 08/19/2020    History of Present Illness Patient is a 86 y/o male who presents with AMS and s/p fall. Admitted with Severe sepsis likely septic shock secondary to UTI. PMH includes NICM EF 20-25%, AICD placement, gout, obesity, PAF on eliquis, PE, DM2, cardiac stenting, CHF, CKD, A-fib, HTN.   Clinical Impression   PT admitted with AMS s/p fall. Pt currently with functional limitiations due to the deficits listed below (see OT problem list). Pt noted to have little edema to R knee compared to L but decreased ROM noted in R LE. Pt tolerated EOB sitting this session and drinking entire supplemental drink provided by Environmental health practitioner. Pt pleasant and corporative.  Pt will benefit from skilled OT to increase their independence and safety with adls and balance to allow discharge SNF.     Follow Up Recommendations  SNF    Equipment Recommendations  Wheelchair (measurements OT);Wheelchair cushion (measurements OT);Hospital bed    Recommendations for Other Services       Precautions / Restrictions Precautions Precautions: Fall      Mobility Bed Mobility Overal bed mobility: Needs Assistance Bed Mobility: Rolling;Supine to Sit;Sit to Supine Rolling: Mod assist   Supine to sit: Mod assist Sit to supine: Max assist   General bed mobility comments: pt requires max cues to rotation neck and reach with arm in direction attempting to roll. pt requires (A) to elevate trunk from bed surface. pt needs max (A) and total (A) for bil Le onto bed surface. bed tilted total +2 max to slid etoward hob with L knee bent  Transfers                 General transfer comment: defer to next session. pt declines to attempt. pt reports standing 3 days ago    Balance   Sitting-balance support: Single extremity supported;Feet supported Sitting balance-Leahy Scale: Fair                                      ADL either performed or assessed with clinical judgement   ADL Overall ADL's : Needs assistance/impaired Eating/Feeding: Set up;Sitting Eating/Feeding Details (indicate cue type and reason): drinking entire supplemental drink  Grooming: Set up;Sitting Grooming Details (indicate cue type and reason): wipe mouth Upper Body Bathing: Maximal assistance   Lower Body Bathing: Maximal assistance   Upper Body Dressing : Maximal assistance   Lower Body Dressing: Maximal assistance     Toilet Transfer Details (indicate cue type and reason): bed level at this time   Toileting - Clothing Manipulation Details (indicate cue type and reason): x5 RN staff room on arrival to help with bed pan void of bowels             Vision   Additional Comments: pt with R gaze preference but able to scan midline and L with cues. pt does not sustain L gaze at all.      Perception     Praxis      Pertinent Vitals/Pain Pain Assessment: Faces Faces Pain Scale: Hurts whole lot Pain Location: R knee Pain Descriptors / Indicators: Grimacing;Moaning Pain Intervention(s): Repositioned;Monitored during session     Hand Dominance Right   Extremity/Trunk Assessment Upper Extremity Assessment Upper Extremity Assessment: RUE deficits/detail;LUE deficits/detail RUE Deficits / Details: AROM 90 degrees LUE Deficits / Details: WFL should flexion  Lower Extremity Assessment Lower Extremity Assessment: Defer to PT evaluation;RLE deficits/detail RLE Deficits / Details: unable to complete full knee extension at EOB with dangling leg. pt with more knee flexion / extension in this position than supine attempts   Cervical / Trunk Assessment Cervical / Trunk Assessment: Kyphotic   Communication Communication Communication: No difficulties   Cognition Arousal/Alertness: Awake/alert Behavior During Therapy: WFL for tasks assessed/performed Overall Cognitive Status:  Impaired/Different from baseline                                 General Comments: pt perseverating on war time discussion and people not understanding what that was like. Pt was very agreeable due to OT active listening and repeating small portions back to the patient and then making request to help aid in acute recovery.    General Comments       Exercises     Shoulder Instructions      Home Living Family/patient expects to be discharged to:: Skilled nursing facility                                 Additional Comments: wife reports [facility] or SNF off [address] / [place] would be good locations for him . "my friend is suppose to call me with the name of the other one"      Prior Functioning/Environment Level of Independence: Independent with assistive device(s)                 OT Problem List: Decreased strength;Decreased activity tolerance;Impaired balance (sitting and/or standing);Decreased safety awareness;Decreased knowledge of use of DME or AE;Decreased knowledge of precautions;Obesity;Decreased cognition;Decreased coordination;Decreased range of motion;Impaired vision/perception      OT Treatment/Interventions: Self-care/ADL training;Therapeutic exercise;Energy conservation;DME and/or AE instruction;Manual therapy;Modalities;Therapeutic activities;Cognitive remediation/compensation;Patient/family education;Balance training;Visual/perceptual remediation/compensation    OT Goals(Current goals can be found in the care plan section) Acute Rehab OT Goals Patient Stated Goal: to get out of here and get back home OT Goal Formulation: With patient Time For Goal Achievement: 09/02/20 Potential to Achieve Goals: Good  OT Frequency: Min 2X/week   Barriers to D/C:            Co-evaluation              AM-PAC OT "6 Clicks" Daily Activity     Outcome Measure Help from another person eating meals?: A Little Help from another  person taking care of personal grooming?: A Little Help from another person toileting, which includes using toliet, bedpan, or urinal?: A Lot Help from another person bathing (including washing, rinsing, drying)?: A Lot Help from another person to put on and taking off regular upper body clothing?: A Lot Help from another person to put on and taking off regular lower body clothing?: A Lot 6 Click Score: 14   End of Session Nurse Communication: Mobility status;Precautions  Activity Tolerance: Patient tolerated treatment well Patient left: in bed;with call bell/phone within reach;with bed alarm set  OT Visit Diagnosis: Unsteadiness on feet (R26.81);Muscle weakness (generalized) (M62.81)                Time: 5681-2751 OT Time Calculation (min): 24 min Charges:  OT General Charges $OT Visit: 1 Visit OT Evaluation $OT Eval Moderate Complexity: 1 Mod   Brynn, OTR/L  Acute Rehabilitation Services Pager: [phone] Office: [phone] .   Mateo Flow 08/19/2020, 3:43  PM

## 2020-08-19 NOTE — Progress Notes (Signed)
PHARMACY NOTE:  ANTIMICROBIAL RENAL DOSAGE ADJUSTMENT  Current antimicrobial regimen includes a mismatch between antimicrobial dosage and estimated renal function.  As per policy approved by the Pharmacy & Therapeutics and Medical Executive Committees, the antimicrobial dosage will be adjusted accordingly.  Current antimicrobial dosage:  Amoxicillin 500 mg PO Q 12 hrs  Indication: UTI  Renal Function:  Estimated Creatinine Clearance: 32.2 mL/min (A) (by C-G formula based on SCr of 1.81 mg/dL (H)). []      On intermittent HD, scheduled: []      On CRRT    Antimicrobial dosage has been changed to:  Amoxicillin 500 mg po Q 8 hrs  Additional comments: Renal function has improved; CrCl now >30 ml/min  Thank you for allowing pharmacy to be a part of this patient's care.  , PharmD, BCPS, [facility] Clinical Pharmacist 08/19/2020 5:13 PM

## 2020-08-19 NOTE — Progress Notes (Signed)
[facility] PROGRESS NOTE   Ryan Macdonald ZOX:[id number] DOB: Jan 29, 1934 DOA: 08/16/2020  PCP: [facility]  Brief History/Interval Summary: 86 y.o. male with medical history significant of paroxysmal A. fib-on Eliquis, PE, type 2 diabetes mellitus, hypertension, hyperlipidemia, nonischemic cardiomyopathy with ejection fraction of 20 to 25% status post AICD placement, CKD stage III, gout, obesity, hypothyroidism brought by EMS for evaluation of fall and sepsis.  In the ED patient was noted to be tachycardic tachypneic with soft blood pressure.  Abnormal UA was noted.  He was found to have elevated troponin.  Started on antibiotics and hospitalized for further management.  Reason for Visit: Urinary tract infection.  Mechanical fall.  Consultants: Cardiology  Procedures: None yet  Antibiotics: Anti-infectives (From admission, onward)   Start     Dose/Rate Route Frequency Ordered Stop   08/18/20 1200  amoxicillin (AMOXIL) capsule 500 mg        500 mg Oral Every 12 hours 08/18/20 1132     08/17/20 1200  cefTRIAXone (ROCEPHIN) 2 g in sodium chloride 0.9 % 100 mL IVPB  Status:  Discontinued        2 g 200 mL/hr over 30 Minutes Intravenous Every 24 hours 08/16/20 1344 08/18/20 1132   08/17/20 1200  azithromycin (ZITHROMAX) 500 mg in sodium chloride 0.9 % 250 mL IVPB  Status:  Discontinued        500 mg 250 mL/hr over 60 Minutes Intravenous Every 24 hours 08/16/20 1344 08/18/20 1132   08/16/20 1115  cefTRIAXone (ROCEPHIN) 2 g in sodium chloride 0.9 % 100 mL IVPB        2 g 200 mL/hr over 30 Minutes Intravenous  Once 08/16/20 1107 08/16/20 1155   08/16/20 1115  azithromycin (ZITHROMAX) 500 mg in sodium chloride 0.9 % 250 mL IVPB        500 mg 250 mL/hr over 60 Minutes Intravenous  Once 08/16/20 1112 08/16/20 1416      Subjective/Interval History: Patient states that he is feeling better.  Feels a little bit stronger.  Denies any chest pain shortness of breath or  lightheadedness.     Assessment/Plan:  Severe sepsis likely septic shock secondary to UTI Chest x-ray reported as possibly showing pneumonia on the right side but patient does not have any cough.  No shortness of breath currently.  Unlikely to be an infectious process.   However his urine was noted to be abnormal.  Urine culture is growing coli and Enterococcus faecalis.  Sensitivities reviewed.  Patient was changed over to amoxicillin.  Blood cultures are negative so far.  Procalcitonin was elevated at 1.24.  Lactic acid was elevated at 4.4 and improved to 2.4.  Fall/pain in the left lower extremity Per patient he did not have any syncopal episode.  He apparently slid off his bed.  Likely a mechanical fall. CT head did not show any acute findings. Denied any pain but he did have limited range of motion of his left lower extremity.  X-ray was done which showed previous arthroplasty but no acute findings.  Seems to be better.  PT and OT evaluation completed.  SNF recommended.    Elevated troponin in the setting of known coronary artery disease Significant elevation in troponin noted.  Patient denied any chest pain.  EKG was similar to previous.  Cardiology was consulted.  Thought to be demand ischemia.  He remains on apixaban.  He is also on statin.  He has known CAD with PCI in 2019.  Cardiac medications on hold due to hypotension.  History of chronic systolic CHF status post AICD Patient with history of nonischemic cardiomyopathy.  Strict ins and outs and daily weights.  Recently seen by heart failure clinic.  It looks like he was on torsemide 80 mg twice a day.  He was also asked to take metolazone more frequently than once a week because he was experiencing lower extremity edema.   Blood pressure has improved.  Will defer reinitiation of his cardiac meds to cardiology at this time.  Echocardiogram was repeated and continues to show low EF of 20 to 25%.  Acute on chronic kidney disease stage  IIIb/hypokalemia Patient's baseline creatinine is around 1.9.  He came in with a BUN of 75 and creatinine of 2.89.  Due to sepsis as well as recent increased diuretic use. Diuretics were placed on hold.  He was gently hydrated per sepsis protocol.   Renal function is now back to baseline.  Potassium is better.  Monitor urine output.    Paroxysmal atrial fibrillation On Eliquis and amiodarone which are being continued.  Hypothyroidism Continue with levothyroxine.  Essential hypertension Noted to have borderline low blood pressures.  Holding his antihypertensives.  Monitor closely.  He is asymptomatic.  Normocytic anemia Baseline hemoglobin noted between 8 and 9.  Hemoglobin dropped down to 7.7 likely dilutional.  No overt bleeding.  Seems to have improved.  Hold off on transfusions.  Hyperlipidemia Continue statin.  History of depression Continue Zoloft.  History of gout Holding allopurinol  History of BPH Holding his tamsulosin and finasteride.  These are held due to hypotension.    Obesity Estimated body mass index is 30.49 kg/m as calculated from the following:   Height as of this encounter: 5\' 7"  (1.702 m).   Weight as of this encounter: 88.3 kg.   DVT Prophylaxis: On Eliquis Code Status: Full code Family Communication: Discussed with the patient Disposition Plan: Lives with his wife.  SNF recommended by PT and OT.  Status is: Inpatient  Remains inpatient appropriate because:IV treatments appropriate due to intensity of illness or inability to take PO and Inpatient level of care appropriate due to severity of illness   Dispo: The patient is from: Home              Anticipated d/c is to: SNF              Anticipated d/c date is: 1-3 days.               Patient currently is not medically stable to d/c. Needs to be cleared by cardiology.     Medications:  Scheduled: . amiodarone  200 mg Oral Daily  . amoxicillin  500 mg Oral Q12H  . apixaban  2.5 mg Oral BID    . iron polysaccharides  150 mg Oral Daily  . levothyroxine  125 mcg Oral QAC breakfast  . lidocaine  1 patch Transdermal Q24H  . polyethylene glycol  17 g Oral BID  . rosuvastatin  10 mg Oral q1800  . senna-docusate  1 tablet Oral BID  . sertraline  25 mg Oral Daily   Continuous:  JYN:[id number] **OR** acetaminophen, benzonatate, ipratropium-albuterol, melatonin, ondansetron **OR** ondansetron (ZOFRAN) IV   Objective:  Vital Signs  Vitals:   08/19/20 0005 08/19/20 0420 08/19/20 0734 08/19/20 0930  BP: 108/75 105/65 100/68 94/62  Pulse: 85 81 80 81  Resp: 20 (!) 21 20 (!) 22  Temp: 98.9 F (37.2 C) 98.7 F (37.1  C) 98.5 F (36.9 C) 97.7 F (36.5 C)  TempSrc: Oral Oral Oral Oral  SpO2: 96% 97% 97% 99%  Weight:      Height:        Intake/Output Summary (Last 24 hours) at 08/19/2020 1014 Last data filed at 08/19/2020 0943 Gross per 24 hour  Intake 1080 ml  Output 1675 ml  Net -595 ml   Filed Weights   08/16/20 1024  Weight: 88.3 kg    General appearance: Awake alert.  In no distress Resp: Clear to auscultation bilaterally.  Normal effort Cardio: S1-S2 is normal regular.  No S3-S4.  No rubs murmurs or bruit GI: Abdomen is soft.  Nontender nondistended.  Bowel sounds are present normal.  No masses organomegaly Extremities:  Improved range of motion of the left leg.  Minimal edema. Neurologic:   No focal neurological deficits.      Lab Results:  Data Reviewed: I have personally reviewed following labs and imaging studies  CBC: Recent Labs  Lab 08/16/20 0948 08/16/20 1611 08/17/20 0716 08/18/20 0144 08/19/20 0431  WBC 7.3 7.3 7.1 7.1 7.3  NEUTROABS 6.0  --   --   --   --   HGB 8.2* 8.0* 7.7* 7.9* 8.0*  HCT 26.1* 25.5* 25.6* 25.4* 26.7*  MCV 89.4 87.6 89.8 90.4 92.4  PLT 254 211 214 216 228    Basic Metabolic Panel: Recent Labs  Lab 08/13/20 1022 08/13/20 1022 08/16/20 0948 08/16/20 1611 08/17/20 0716 08/18/20 0144 08/19/20 0431  NA 136   --  135  --  139 140 138  K 3.6  --  3.5  --  3.2* 3.4* 4.2  CL 98  --  97*  --  104 106 105  CO2 25  --  21*  --  25 26 25   GLUCOSE 115*  --  158*  --  94 109* 123*  BUN 49*  --  75*  --  62* 51* 45*  CREATININE 2.14*   < > 2.89* 2.38* 2.17* 1.91* 1.81*  CALCIUM 9.2  --  9.0  --  8.6* 8.7* 8.8*  MG  --   --   --  2.0  --   --  2.0  PHOS  --   --   --  4.0  --   --   --    < > = values in this interval not displayed.    GFR: Estimated Creatinine Clearance: 32.2 mL/min (A) (by C-G formula based on SCr of 1.81 mg/dL (H)).  Liver Function Tests: Recent Labs  Lab 08/16/20 0948 08/17/20 0716 08/18/20 0144 08/19/20 0431  AST 82* 94* 87* 71*  ALT 27 27 26 27   ALKPHOS 76 63 64 67  BILITOT 1.0 1.1 0.8 1.1  PROT 7.8 7.1 7.0 7.0  ALBUMIN 2.2* 1.9* 1.9* 1.9*    Coagulation Profile: Recent Labs  Lab 08/16/20 0948 08/17/20 0716  INR 1.8* 1.8*    Cardiac Enzymes: Recent Labs  Lab 08/16/20 1150  CKTOTAL 732*    CBG: Recent Labs  Lab 08/16/20 0942 08/17/20 0033  GLUCAP 162* 101*    Thyroid Function Tests: Recent Labs    08/16/20 1611  TSH 1.475     Recent Results (from the past 240 hour(s))  SARS Coronavirus 2 by RT PCR (hospital order, performed in [facility] hospital lab) Nasopharyngeal Nasopharyngeal Swab     Status: None   Collection Time: 08/16/20  9:40 AM   Specimen: Nasopharyngeal Swab  Result Value Ref Range  Status   SARS Coronavirus 2 NEGATIVE NEGATIVE Final    Comment: (NOTE) SARS-CoV-2 target nucleic acids are NOT DETECTED.  The SARS-CoV-2 RNA is generally detectable in upper and lower respiratory specimens during the acute phase of infection. The lowest concentration of SARS-CoV-2 viral copies this assay can detect is 250 copies / mL. A negative result does not preclude SARS-CoV-2 infection and should not be used as the sole basis for treatment or other patient management decisions.  A negative result may occur with improper specimen collection  / handling, submission of specimen other than nasopharyngeal swab, presence of viral mutation(s) within the areas targeted by this assay, and inadequate number of viral copies (<250 copies / mL). A negative result must be combined with clinical observations, patient history, and epidemiological information.  Fact Sheet for Patients:   BoilerBrush.com.cy  Fact Sheet for Healthcare Providers: https://pope.com/  This test is not yet approved or  cleared by the [place] FDA and has been authorized for detection and/or diagnosis of SARS-CoV-2 by FDA under an Emergency Use Authorization (EUA).  This EUA will remain in effect (meaning this test can be used) for the duration of the COVID-19 declaration under Section 564(b)(1) of the Act, 21 U.S.C. section 360bbb-3(b)(1), unless the authorization is terminated or revoked sooner.  Performed at [facility], [address], [place], [place] [address]   Blood culture (routine single)     Status: None (Preliminary result)   Collection Time: 08/16/20 10:04 AM   Specimen: BLOOD RIGHT FOREARM  Result Value Ref Range Status   Specimen Description BLOOD RIGHT FOREARM  Final   Special Requests   Final    BOTTLES DRAWN AEROBIC AND ANAEROBIC Blood Culture results may not be optimal due to an excessive volume of blood received in culture bottles   Culture   Final    NO GROWTH 2 DAYS Performed at [facility], [address], [place], [place] [address]    Report Status PENDING  Incomplete  Urine culture     Status: Abnormal   Collection Time: 08/16/20 12:40 PM   Specimen: In/Out Cath Urine  Result Value Ref Range Status   Specimen Description IN/OUT CATH URINE  Final   Special Requests   Final    NONE Performed at [facility], [address], [place], [place] [address]    Culture (A)  Final    >=100,000 COLONIES/mL ESCHERICHIA COLI >=100,000 COLONIES/mL ENTEROCOCCUS  FAECALIS    Report Status 08/18/2020 FINAL  Final   Organism ID, Bacteria ESCHERICHIA COLI (A)  Final   Organism ID, Bacteria ENTEROCOCCUS FAECALIS (A)  Final      Susceptibility   Escherichia coli - MIC*    AMPICILLIN <=2 SENSITIVE Sensitive     CEFAZOLIN <=4 SENSITIVE Sensitive     CEFTRIAXONE <=0.25 SENSITIVE Sensitive     CIPROFLOXACIN <=0.25 SENSITIVE Sensitive     GENTAMICIN <=1 SENSITIVE Sensitive     IMIPENEM <=0.25 SENSITIVE Sensitive     NITROFURANTOIN <=16 SENSITIVE Sensitive     TRIMETH/SULFA <=20 SENSITIVE Sensitive     AMPICILLIN/SULBACTAM <=2 SENSITIVE Sensitive     PIP/TAZO <=4 SENSITIVE Sensitive     * >=100,000 COLONIES/mL ESCHERICHIA COLI   Enterococcus faecalis - MIC*    AMPICILLIN <=2 SENSITIVE Sensitive     NITROFURANTOIN <=16 SENSITIVE Sensitive     VANCOMYCIN 2 SENSITIVE Sensitive     * >=100,000 COLONIES/mL ENTEROCOCCUS FAECALIS  Culture, blood (single) w Reflex to ID Panel  Status: None (Preliminary result)   Collection Time: 08/16/20  4:11 PM   Specimen: BLOOD LEFT HAND  Result Value Ref Range Status   Specimen Description BLOOD LEFT HAND  Final   Special Requests   Final    BOTTLES DRAWN AEROBIC ONLY Blood Culture adequate volume   Culture   Final    NO GROWTH 2 DAYS Performed at [facility], [address], [place], [place] [address]    Report Status PENDING  Incomplete  Culture, blood (routine x 2)     Status: None (Preliminary result)   Collection Time: 08/16/20  4:34 PM   Specimen: BLOOD  Result Value Ref Range Status   Specimen Description BLOOD RIGHT ANTECUBITAL  Final   Special Requests AEROBIC BOTTLE ONLY Blood Culture adequate volume  Final   Culture   Final    NO GROWTH 2 DAYS Performed at [facility], [address], [place], [place] [address]    Report Status PENDING  Incomplete      Radiology Studies: ECHOCARDIOGRAM COMPLETE  Result Date: 08/17/2020    ECHOCARDIOGRAM REPORT   Patient Name:   Ryan Macdonald  Date of Exam: 08/17/2020 Medical Rec #:  [id number]      Height:       67.0 in Accession #:    [id number]     Weight:       194.7 lb Date of Birth:  May 14, 1934      BSA:          1.999 m Patient Age:    86 years       BP:           99/58 mmHg Patient Gender: M              HR:           82 bpm. Exam Location:  Inpatient Procedure: 2D Echo, Cardiac Doppler and Color Doppler Indications:    Elevated Troponin  History:        Patient has prior history of Echocardiogram examinations, most                 recent 03/27/2020. Cardiomyopathy and CHF, Arrythmias:Atrial                 Fibrillation, Signs/Symptoms:Dyspnea; Risk Factors:Hypertension                 and Diabetes.  Sonographer:    Eulah Pont RDCS Referring Phys: [id number] Corrin Parker  Sonographer Comments: Technically difficult study due to poor echo windows. IMPRESSIONS  1. Left ventricular ejection fraction, by estimation, is 20 to 25%. The left ventricle has severely decreased function. The left ventricle demonstrates global hypokinesis. The left ventricular internal cavity size was severely dilated. Left ventricular diastolic parameters are indeterminate.  2. Right ventricular systolic function is mildly reduced. The right ventricular size is normal. There is normal pulmonary artery systolic pressure. The estimated right ventricular systolic pressure is 19.4 mmHg.  3. Left atrial size was moderately dilated.  4. The mitral valve is normal in structure. Mild mitral valve regurgitation. No evidence of mitral stenosis.  5. The aortic valve has an indeterminant number of cusps. There is severe calcifcation of the aortic valve. There is severe thickening of the aortic valve. Aortic valve regurgitation is not visualized. There is at least Moderate aortic valve stenosis and possibly severe by calculated AVA but the mean and peak transaortic gradients are low due to low flow low output AS.  SVI is low at 21. Aortic valve area, by VTI measures 1.27 cm. Aortic  valve mean gradient measures 6.0 mmHg.  6. The inferior vena cava is dilated in size with >50% respiratory variability, suggesting right atrial pressure of 8 mmHg. FINDINGS  Left Ventricle: Left ventricular ejection fraction, by estimation, is 20 to 25%. The left ventricle has severely decreased function. The left ventricle demonstrates global hypokinesis. The left ventricular internal cavity size was severely dilated. There is no left ventricular hypertrophy of the basal-septal segment. Left ventricular diastolic function could not be evaluated due to atrial fibrillation. Left ventricular diastolic parameters are indeterminate. Right Ventricle: The right ventricular size is normal. No increase in right ventricular wall thickness. Right ventricular systolic function is mildly reduced. There is normal pulmonary artery systolic pressure. The tricuspid regurgitant velocity is 1.69 m/s, and with an assumed right atrial pressure of 8 mmHg, the estimated right ventricular systolic pressure is 19.4 mmHg. Left Atrium: Left atrial size was moderately dilated. Right Atrium: Right atrial size was normal in size. Pericardium: There is no evidence of pericardial effusion. Mitral Valve: The mitral valve is normal in structure. Mild mitral valve regurgitation. No evidence of mitral valve stenosis. Tricuspid Valve: The tricuspid valve is normal in structure. Tricuspid valve regurgitation is trivial. No evidence of tricuspid stenosis. Aortic Valve: The aortic valve has an indeterminant number of cusps. There is severe calcifcation of the aortic valve. There is severe thickening of the aortic valve. Aortic valve regurgitation is not visualized. Moderate aortic stenosis is present. Aortic valve mean gradient measures 6.0 mmHg. Aortic valve peak gradient measures 12.0 mmHg. Aortic valve area, by VTI measures 1.27 cm. Pulmonic Valve: The pulmonic valve was normal in structure. Pulmonic valve regurgitation is not visualized. No evidence  of pulmonic stenosis. Aorta: The aortic root is normal in size and structure. Venous: The inferior vena cava is dilated in size with greater than 50% respiratory variability, suggesting right atrial pressure of 8 mmHg. IAS/Shunts: No atrial level shunt detected by color flow Doppler. Additional Comments: A pacer wire is visualized.  LEFT VENTRICLE PLAX 2D LVIDd:         7.50 cm LVIDs:         6.60 cm LV PW:         1.00 cm LV IVS:        0.90 cm LVOT diam:     1.90 cm LV SV:         42 LV SV Index:   21 LVOT Area:     2.84 cm  LV Volumes (MOD) LV vol d, MOD A4C: 221.0 ml LV vol s, MOD A4C: 150.0 ml LV SV MOD A4C:     221.0 ml RIGHT VENTRICLE RV S prime:     7.05 cm/s TAPSE (M-mode): 1.3 cm LEFT ATRIUM              Index       RIGHT ATRIUM           Index LA diam:        4.30 cm  2.15 cm/m  RA Area:     17.30 cm LA Vol (A2C):   103.0 ml 51.52 ml/m RA Volume:   50.00 ml  25.01 ml/m LA Vol (A4C):   74.7 ml  37.37 ml/m LA Biplane Vol: 88.9 ml  44.47 ml/m  AORTIC VALVE AV Area (Vmax):    1.48 cm AV Area (Vmean):   1.38 cm AV Area (VTI):     1.27  cm AV Vmax:           173.00 cm/s AV Vmean:          116.000 cm/s AV VTI:            0.328 m AV Peak Grad:      12.0 mmHg AV Mean Grad:      6.0 mmHg LVOT Vmax:         90.60 cm/s LVOT Vmean:        56.300 cm/s LVOT VTI:          0.147 m LVOT/AV VTI ratio: 0.45  AORTA Ao Root diam: 3.50 cm Ao Asc diam:  3.60 cm MR Peak grad: 58.7 mmHg   TRICUSPID VALVE MR Mean grad: 34.0 mmHg   TR Peak grad:   11.4 mmHg MR Vmax:      383.00 cm/s TR Vmax:        169.00 cm/s MR Vmean:     273.0 cm/s                           SHUNTS                           Systemic VTI:  0.15 m                           Systemic Diam: 1.90 cm Armanda Magic MD Electronically signed by Armanda Magic MD Signature Date/Time: 08/17/2020/4:32:52 PM    Final    DG HIP PORT UNILAT WITH PELVIS 1V LEFT  Result Date: 08/17/2020 CLINICAL DATA:  Pain in LEFT hip EXAM: DG HIP (WITH OR WITHOUT PELVIS) 1V PORT LEFT  COMPARISON:  March 14, 2019 FINDINGS: Status post LEFT hip arthroplasty. Orthopedic hardware is intact and without periprosthetic fracture or lucency. There is partial bridging heterotopic ossifications along the LEFT superior implant, unchanged in comparison to prior. Osteopenia. No acute fracture or dislocation. Mild degenerative changes of the RIGHT hip. Degenerative changes of the lower lumbar spine. Transitional anatomy with a LEFT-sided assimilation joint at L5-S1. No area of erosion or osseous destruction. No unexpected radiopaque foreign body. Vascular calcifications. IMPRESSION: 1. Status post LEFT hip arthroplasty without evidence of hardware complication. 2. No acute osseous abnormality. Electronically Signed   By: Meda Klinefelter MD   On: 08/17/2020 11:09       LOS: 3 days   Osvaldo Shipper  [facility] Pager on [email or web address]  08/19/2020, 10:14 AM

## 2020-08-19 NOTE — Progress Notes (Signed)
Progress Note  Patient Name: Ryan Macdonald Date of Encounter: 08/19/2020  Primary Cardiologist:  [name], MD  Subjective   No issues overnight. ICD interrogated, no arrhythmias or shocks. Recorded minimally net negative overnight-  BP stable in the low 100's systolic. Creatinine further improved to 1.81 today. Remains off of HF meds- last HF clinic dry weight was 195 lbs.  Inpatient Medications    Scheduled Meds: . amiodarone  200 mg Oral Daily  . amoxicillin  500 mg Oral Q12H  . apixaban  2.5 mg Oral BID  . iron polysaccharides  150 mg Oral Daily  . levothyroxine  125 mcg Oral QAC breakfast  . lidocaine  1 patch Transdermal Q24H  . polyethylene glycol  17 g Oral BID  . rosuvastatin  10 mg Oral q1800  . senna-docusate  1 tablet Oral BID  . sertraline  25 mg Oral Daily   Continuous Infusions:  PRN Meds: acetaminophen **OR** acetaminophen, benzonatate, ipratropium-albuterol, melatonin, ondansetron **OR** ondansetron (ZOFRAN) IV   Vital Signs    Vitals:   08/18/20 1912 08/19/20 0005 08/19/20 0420 08/19/20 0734  BP: 105/65 108/75 105/65 100/68  Pulse: 84 85 81 80  Resp: 18 20 (!) 21 20  Temp: 98.2 F (36.8 C) 98.9 F (37.2 C) 98.7 F (37.1 C) 98.5 F (36.9 C)  TempSrc: Oral Oral Oral Oral  SpO2: 98% 96% 97% 97%  Weight:      Height:        Intake/Output Summary (Last 24 hours) at 08/19/2020 1761 Last data filed at 08/19/2020 6073 Gross per 24 hour  Intake 1080 ml  Output 1675 ml  Net -595 ml   Filed Weights   08/16/20 1024  Weight: 88.3 kg   Last Weight  Most recent update: 08/16/2020 10:25 AM   Weight  88.3 kg (194 lb 10.7 oz)           Weight change:    Telemetry    SR, PVCs - Personally Reviewed  ECG    None today - Personally Reviewed  Physical Exam   General: Well developed, well nourished, male appearing in no acute distress. Head: Normocephalic, atraumatic.  Neck: Supple without bruits, JVD mildly elevated. Lungs:  Resp  regular and unlabored, rales bases Heart: RRR, S1, S2, no S3, S4, soft murmur; no rub. Abdomen: Soft, non-tender, non-distended with normoactive bowel sounds. No hepatomegaly. No rebound/guarding. No obvious abdominal masses. Extremities: No clubbing, cyanosis, no edema. Distal pedal pulses are 2+ bilaterally. Neuro: Alert and oriented X 3. Moves all extremities spontaneously. Psych: Normal affect.  Labs    Hematology Recent Labs  Lab 08/17/20 0716 08/18/20 0144 08/19/20 0431  WBC 7.1 7.1 7.3  RBC 2.85* 2.81* 2.89*  HGB 7.7* 7.9* 8.0*  HCT 25.6* 25.4* 26.7*  MCV 89.8 90.4 92.4  MCH 27.0 28.1 27.7  MCHC 30.1 31.1 30.0  RDW 25.9* 26.1* 26.0*  PLT 214 216 228    Chemistry Recent Labs  Lab 08/17/20 0716 08/18/20 0144 08/19/20 0431  NA 139 140 138  K 3.2* 3.4* 4.2  CL 104 106 105  CO2 25 26 25   GLUCOSE 94 109* 123*  BUN 62* 51* 45*  CREATININE 2.17* 1.91* 1.81*  CALCIUM 8.6* 8.7* 8.8*  PROT 7.1 7.0 7.0  ALBUMIN 1.9* 1.9* 1.9*  AST 94* 87* 71*  ALT 27 26 27   ALKPHOS 63 64 67  BILITOT 1.1 0.8 1.1  GFRNONAA 27* 31* 34*  GFRAA 31* 36* 39*  ANIONGAP 10 8 8  High Sensitivity Troponin:   Recent Labs  Lab 08/16/20 0948 08/16/20 1150 08/16/20 1611  TROPONINIHS 732* 1,147* 1,743*      BNP Recent Labs  Lab 08/17/20 0716  BNP 2,349.4*     DDimer No results for input(s): DDIMER in the last 168 hours.   Radiology    CT Head Wo Contrast  Result Date: 08/16/2020 CLINICAL DATA:  Delirium and fall EXAM: CT HEAD WITHOUT CONTRAST TECHNIQUE: Contiguous axial images were obtained from the base of the skull through the vertex without intravenous contrast. COMPARISON:  None. FINDINGS: Brain: No evidence of acute infarction, hemorrhage, hydrocephalus, extra-axial collection or mass lesion/mass effect. Periventricular white matter hypodensities consistent with sequela of chronic microvascular ischemic disease. Vascular: Vascular calcifications. Skull: Normal. Negative for  fracture or focal lesion. Sinuses/Orbits: No acute finding. Other: None. IMPRESSION: 1.  No acute intracranial abnormality. Electronically Signed   By: Meda Klinefelter MD   On: 08/16/2020 11:01   DG Chest Port 1 View  Result Date: 08/16/2020 CLINICAL DATA:  Delirium EXAM: PORTABLE CHEST 1 VIEW COMPARISON:  August 08, 2020 FINDINGS: The cardiomediastinal silhouette is unchanged and enlarged in contour.Evaluation is limited secondary to patient rotation. LEFT chest AICD. Small RIGHT pleural effusion. No pneumothorax. Perihilar vascular fullness with diffuse interstitial opacities, similar comparison to prior. Persistent heterogeneous opacity of the RIGHT lung base. Visualized abdomen is unremarkable. Multilevel degenerative changes of the thoracic spine. IMPRESSION: 1. Cardiomegaly with pulmonary edema and small RIGHT pleural effusion. 2. Persistent heterogeneous opacity of the RIGHT lung base. Differential considerations include infection versus atelectasis. Electronically Signed   By: Meda Klinefelter MD   On: 08/16/2020 11:03   ECHOCARDIOGRAM COMPLETE  Result Date: 08/17/2020    ECHOCARDIOGRAM REPORT   Patient Name:   Fong Lippe Date of Exam: 08/17/2020 Medical Rec #:  [id number]      Height:       67.0 in Accession #:    [id number]     Weight:       194.7 lb Date of Birth:  11-01-1934      BSA:          1.999 m Patient Age:    86 years       BP:           99/58 mmHg Patient Gender: M              HR:           82 bpm. Exam Location:  Inpatient Procedure: 2D Echo, Cardiac Doppler and Color Doppler Indications:    Elevated Troponin  History:        Patient has prior history of Echocardiogram examinations, most                 recent 03/27/2020. Cardiomyopathy and CHF, Arrythmias:Atrial                 Fibrillation, Signs/Symptoms:Dyspnea; Risk Factors:Hypertension                 and Diabetes.  Sonographer:    Eulah Pont RDCS Referring Phys: [id number] Corrin Parker  Sonographer Comments:  Technically difficult study due to poor echo windows. IMPRESSIONS  1. Left ventricular ejection fraction, by estimation, is 20 to 25%. The left ventricle has severely decreased function. The left ventricle demonstrates global hypokinesis. The left ventricular internal cavity size was severely dilated. Left ventricular diastolic parameters are indeterminate.  2. Right ventricular systolic function is mildly reduced. The right ventricular size is normal. There  is normal pulmonary artery systolic pressure. The estimated right ventricular systolic pressure is 19.4 mmHg.  3. Left atrial size was moderately dilated.  4. The mitral valve is normal in structure. Mild mitral valve regurgitation. No evidence of mitral stenosis.  5. The aortic valve has an indeterminant number of cusps. There is severe calcifcation of the aortic valve. There is severe thickening of the aortic valve. Aortic valve regurgitation is not visualized. There is at least Moderate aortic valve stenosis and possibly severe by calculated AVA but the mean and peak transaortic gradients are low due to low flow low output AS. SVI is low at 21. Aortic valve area, by VTI measures 1.27 cm. Aortic valve mean gradient measures 6.0 mmHg.  6. The inferior vena cava is dilated in size with >50% respiratory variability, suggesting right atrial pressure of 8 mmHg. FINDINGS  Left Ventricle: Left ventricular ejection fraction, by estimation, is 20 to 25%. The left ventricle has severely decreased function. The left ventricle demonstrates global hypokinesis. The left ventricular internal cavity size was severely dilated. There is no left ventricular hypertrophy of the basal-septal segment. Left ventricular diastolic function could not be evaluated due to atrial fibrillation. Left ventricular diastolic parameters are indeterminate. Right Ventricle: The right ventricular size is normal. No increase in right ventricular wall thickness. Right ventricular systolic function  is mildly reduced. There is normal pulmonary artery systolic pressure. The tricuspid regurgitant velocity is 1.69 m/s, and with an assumed right atrial pressure of 8 mmHg, the estimated right ventricular systolic pressure is 19.4 mmHg. Left Atrium: Left atrial size was moderately dilated. Right Atrium: Right atrial size was normal in size. Pericardium: There is no evidence of pericardial effusion. Mitral Valve: The mitral valve is normal in structure. Mild mitral valve regurgitation. No evidence of mitral valve stenosis. Tricuspid Valve: The tricuspid valve is normal in structure. Tricuspid valve regurgitation is trivial. No evidence of tricuspid stenosis. Aortic Valve: The aortic valve has an indeterminant number of cusps. There is severe calcifcation of the aortic valve. There is severe thickening of the aortic valve. Aortic valve regurgitation is not visualized. Moderate aortic stenosis is present. Aortic valve mean gradient measures 6.0 mmHg. Aortic valve peak gradient measures 12.0 mmHg. Aortic valve area, by VTI measures 1.27 cm. Pulmonic Valve: The pulmonic valve was normal in structure. Pulmonic valve regurgitation is not visualized. No evidence of pulmonic stenosis. Aorta: The aortic root is normal in size and structure. Venous: The inferior vena cava is dilated in size with greater than 50% respiratory variability, suggesting right atrial pressure of 8 mmHg. IAS/Shunts: No atrial level shunt detected by color flow Doppler. Additional Comments: A pacer wire is visualized.  LEFT VENTRICLE PLAX 2D LVIDd:         7.50 cm LVIDs:         6.60 cm LV PW:         1.00 cm LV IVS:        0.90 cm LVOT diam:     1.90 cm LV SV:         42 LV SV Index:   21 LVOT Area:     2.84 cm  LV Volumes (MOD) LV vol d, MOD A4C: 221.0 ml LV vol s, MOD A4C: 150.0 ml LV SV MOD A4C:     221.0 ml RIGHT VENTRICLE RV S prime:     7.05 cm/s TAPSE (M-mode): 1.3 cm LEFT ATRIUM              Index  RIGHT ATRIUM           Index LA diam:         4.30 cm  2.15 cm/m  RA Area:     17.30 cm LA Vol (A2C):   103.0 ml 51.52 ml/m RA Volume:   50.00 ml  25.01 ml/m LA Vol (A4C):   74.7 ml  37.37 ml/m LA Biplane Vol: 88.9 ml  44.47 ml/m  AORTIC VALVE AV Area (Vmax):    1.48 cm AV Area (Vmean):   1.38 cm AV Area (VTI):     1.27 cm AV Vmax:           173.00 cm/s AV Vmean:          116.000 cm/s AV VTI:            0.328 m AV Peak Grad:      12.0 mmHg AV Mean Grad:      6.0 mmHg LVOT Vmax:         90.60 cm/s LVOT Vmean:        56.300 cm/s LVOT VTI:          0.147 m LVOT/AV VTI ratio: 0.45  AORTA Ao Root diam: 3.50 cm Ao Asc diam:  3.60 cm MR Peak grad: 58.7 mmHg   TRICUSPID VALVE MR Mean grad: 34.0 mmHg   TR Peak grad:   11.4 mmHg MR Vmax:      383.00 cm/s TR Vmax:        169.00 cm/s MR Vmean:     273.0 cm/s                           SHUNTS                           Systemic VTI:  0.15 m                           Systemic Diam: 1.90 cm Armanda Magic MD Electronically signed by Armanda Magic MD Signature Date/Time: 08/17/2020/4:32:52 PM    Final    DG HIP PORT UNILAT WITH PELVIS 1V LEFT  Result Date: 08/17/2020 CLINICAL DATA:  Pain in LEFT hip EXAM: DG HIP (WITH OR WITHOUT PELVIS) 1V PORT LEFT COMPARISON:  March 14, 2019 FINDINGS: Status post LEFT hip arthroplasty. Orthopedic hardware is intact and without periprosthetic fracture or lucency. There is partial bridging heterotopic ossifications along the LEFT superior implant, unchanged in comparison to prior. Osteopenia. No acute fracture or dislocation. Mild degenerative changes of the RIGHT hip. Degenerative changes of the lower lumbar spine. Transitional anatomy with a LEFT-sided assimilation joint at L5-S1. No area of erosion or osseous destruction. No unexpected radiopaque foreign body. Vascular calcifications. IMPRESSION: 1. Status post LEFT hip arthroplasty without evidence of hardware complication. 2. No acute osseous abnormality. Electronically Signed   By: Meda Klinefelter MD   On: 08/17/2020 11:09      Cardiac Studies   ECHO:  08/17/2020 1. Left ventricular ejection fraction, by estimation, is 20 to 25%. The  left ventricle has severely decreased function. The left ventricle  demonstrates global hypokinesis. The left ventricular internal cavity size  was severely dilated. Left ventricular diastolic parameters are indeterminate.  2. Right ventricular systolic function is mildly reduced. The right  ventricular size is normal. There is normal pulmonary artery systolic  pressure. The estimated right ventricular systolic pressure is 19.4  mmHg.  3. Left atrial size was moderately dilated.  4. The mitral valve is normal in structure. Mild mitral valve  regurgitation. No evidence of mitral stenosis.  5. The aortic valve has an indeterminant number of cusps. There is severe calcifcation of the aortic valve. There is severe thickening of the aortic valve. Aortic valve regurgitation is not visualized. There is at least  Moderate aortic valve stenosis and possibly severe by calculated AVA but the mean and peak transaortic gradients are low due to low flow low output AS. SVI is low at 21. Aortic valve area, by VTI measures 1.27 cm. Aortic valve mean gradient measures 6.0 mmHg.  6. The inferior vena cava is dilated in size with >50% respiratory  variability, suggesting right atrial pressure of 8 mmHg.   Patient Profile     86 y.o. male w/ hx CAD s/p DES to proximal LAD in 07/2018 with residual non-obstructive disease, chronic systolic CHF/non-ischemic cardiomyopathy with EF of 25% on most recent Echo in 02/2020, s/p ICD for primary prevention, atrial fibrillation on Eliquis, chronic hypoxic respiratory failure on home O2 PRN, hypertension, type 2 diabetes mellitus, and gout  who was admitted 09/18 w/ sepsis, Cards saw 09/19 for elevated troponin.  Assessment & Plan    1. Elevated troponin - peak 1743 - echo results above - concern for low-flow, low-output AS - discuss further eval once  pt recovers from acute event - no ischemic sx - ICD interrogation unremarkable - suspect troponin elevation d/t demand ischemia  2. Chronic systolic CHF, NICM - diuretics and CHF rx held on admit due to hypotension - in Re[facility] clip program, pharmacist checking numbers - today reading 29% so no further diuresis needed - however, pulm edema on CXR 09/18, MD advise on recheck - SBP 80s-100s, too low to restart rx - continue to follow I/O and daily wts - Lasix prn for now - I/O +1 L but are incomplete - need daily wts   3. Sepsis - 2nd to UTI - ?PNA on R as well but no cough - ABX per IM  4. Anemia - Hgb has been < 10 since 11/2019 - MCV has been slowly trending down, this admit < 90 till today - hx iron deficient - not currently on iron, will start NuIron 150 mg qd  5. AKI on CKD III - BUN/Cr 75/2.89 on admit - torsemide 80 mg am and 40 mg pm, metolazone 2.5 mg 2x wk and spiro12.5 mg held on admit - labs improved, watch volume, add daily weights, strict I's/O's - may consider resuming torsemide in the next 1-2 days.  Chrystie Nose, MD, [facility] FACP    [facility]  Medical Director of the [facility] Diplomate of the American Board of Clinical Lipidology Attending Cardiologist  Direct Dial: [phone]  Fax: [phone]  Website:  [email or web address]

## 2020-08-20 ENCOUNTER — Ambulatory Visit: Payer: Non-veteran care | Admitting: Podiatry

## 2020-08-20 DIAGNOSIS — R7989 Other specified abnormal findings of blood chemistry: Secondary | ICD-10-CM

## 2020-08-20 DIAGNOSIS — N183 Chronic kidney disease, stage 3 unspecified: Secondary | ICD-10-CM

## 2020-08-20 LAB — CBC
HCT: 28.1 % — ABNORMAL LOW (ref 39.0–52.0)
Hemoglobin: 8.6 g/dL — ABNORMAL LOW (ref 13.0–17.0)
MCH: 28.1 pg (ref 26.0–34.0)
MCHC: 30.6 g/dL (ref 30.0–36.0)
MCV: 91.8 fL (ref 80.0–100.0)
Platelets: 225 10*3/uL (ref 150–400)
RBC: 3.06 MIL/uL — ABNORMAL LOW (ref 4.22–5.81)
RDW: 25.9 % — ABNORMAL HIGH (ref 11.5–15.5)
WBC: 6 10*3/uL (ref 4.0–10.5)
nRBC: 0 % (ref 0.0–0.2)

## 2020-08-20 LAB — COMPREHENSIVE METABOLIC PANEL
ALT: 27 U/L (ref 0–44)
AST: 64 U/L — ABNORMAL HIGH (ref 15–41)
Albumin: 1.9 g/dL — ABNORMAL LOW (ref 3.5–5.0)
Alkaline Phosphatase: 67 U/L (ref 38–126)
Anion gap: 8 (ref 5–15)
BUN: 40 mg/dL — ABNORMAL HIGH (ref 8–23)
CO2: 25 mmol/L (ref 22–32)
Calcium: 8.7 mg/dL — ABNORMAL LOW (ref 8.9–10.3)
Chloride: 104 mmol/L (ref 98–111)
Creatinine, Ser: 1.51 mg/dL — ABNORMAL HIGH (ref 0.61–1.24)
GFR calc Af Amer: 48 mL/min — ABNORMAL LOW (ref >60)
GFR calc non Af Amer: 42 mL/min — ABNORMAL LOW (ref >60)
Glucose, Bld: 111 mg/dL — ABNORMAL HIGH (ref 70–99)
Potassium: 4.3 mmol/L (ref 3.5–5.1)
Sodium: 137 mmol/L (ref 135–145)
Total Bilirubin: 1.1 mg/dL (ref 0.3–1.2)
Total Protein: 6.9 g/dL (ref 6.5–8.1)

## 2020-08-20 LAB — TROPONIN I (HIGH SENSITIVITY): Troponin I (High Sensitivity): 347 ng/L (ref <18)

## 2020-08-20 MED ORDER — SPIRONOLACTONE 12.5 MG HALF TABLET
12.5000 mg | ORAL_TABLET | Freq: Every day | ORAL | Status: DC
  Administered 2020-08-20 – 2020-08-21 (×2): 12.5 mg via ORAL
  Filled 2020-08-20 (×2): qty 1

## 2020-08-20 MED ORDER — TORSEMIDE 20 MG PO TABS
40.0000 mg | ORAL_TABLET | Freq: Every evening | ORAL | Status: DC
  Administered 2020-08-20: 40 mg via ORAL
  Filled 2020-08-20: qty 2

## 2020-08-20 MED ORDER — TORSEMIDE 20 MG PO TABS
80.0000 mg | ORAL_TABLET | Freq: Every day | ORAL | Status: DC
  Administered 2020-08-21: 80 mg via ORAL
  Filled 2020-08-20: qty 4

## 2020-08-20 MED ORDER — SACCHAROMYCES BOULARDII 250 MG PO CAPS
250.0000 mg | ORAL_CAPSULE | Freq: Two times a day (BID) | ORAL | Status: DC
  Administered 2020-08-20 – 2020-08-21 (×3): 250 mg via ORAL
  Filled 2020-08-20 (×3): qty 1

## 2020-08-20 NOTE — Progress Notes (Addendum)
[facility] PROGRESS NOTE   Tyliek Timberman VFI:[id number] DOB: 1934-08-28 DOA: 08/16/2020  PCP: [facility]  Brief History/Interval Summary: 86 y.o. male with medical history significant of paroxysmal A. fib-on Eliquis, PE, type 2 diabetes mellitus, hypertension, hyperlipidemia, nonischemic cardiomyopathy with ejection fraction of 20 to 25% status post AICD placement, CKD stage III, gout, obesity, hypothyroidism brought by EMS for evaluation of fall and sepsis.  In the ED patient was noted to be tachycardic tachypneic with soft blood pressure.  Abnormal UA was noted.  He was found to have elevated troponin.  Started on antibiotics and hospitalized for further management.  Reason for Visit: Urinary tract infection.  Mechanical fall.  Consultants: Cardiology  Procedures: None yet  Antibiotics: Anti-infectives (From admission, onward)   Start     Dose/Rate Route Frequency Ordered Stop   08/19/20 2300  amoxicillin (AMOXIL) capsule 500 mg        500 mg Oral Every 8 hours 08/19/20 1712     08/19/20 1715  amoxicillin (AMOXIL) capsule 500 mg        500 mg Oral  Once 08/19/20 1712 08/19/20 1748   08/18/20 1200  amoxicillin (AMOXIL) capsule 500 mg  Status:  Discontinued        500 mg Oral Every 12 hours 08/18/20 1132 08/19/20 1712   08/17/20 1200  cefTRIAXone (ROCEPHIN) 2 g in sodium chloride 0.9 % 100 mL IVPB  Status:  Discontinued        2 g 200 mL/hr over 30 Minutes Intravenous Every 24 hours 08/16/20 1344 08/18/20 1132   08/17/20 1200  azithromycin (ZITHROMAX) 500 mg in sodium chloride 0.9 % 250 mL IVPB  Status:  Discontinued        500 mg 250 mL/hr over 60 Minutes Intravenous Every 24 hours 08/16/20 1344 08/18/20 1132   08/16/20 1115  cefTRIAXone (ROCEPHIN) 2 g in sodium chloride 0.9 % 100 mL IVPB        2 g 200 mL/hr over 30 Minutes Intravenous  Once 08/16/20 1107 08/16/20 1155   08/16/20 1115  azithromycin (ZITHROMAX) 500 mg in sodium chloride 0.9 % 250 mL IVPB         500 mg 250 mL/hr over 60 Minutes Intravenous  Once 08/16/20 1112 08/16/20 1416      Subjective/Interval History: Patient denies any complaints.  His back pain is chronic.  Currently rates the pain at 3 out of 10.  He holding his medications due to low blood pressure.  He understands.  Can use Tylenol.  He denies any chest pain shortness of breath or lightheadedness.     Assessment/Plan:  Severe sepsis likely septic shock secondary to UTI Chest x-ray reported as possibly showing pneumonia on the right side but patient does not have any cough.  No shortness of breath currently.  Unlikely to be an infectious process.   However his urine was noted to be abnormal.  Urine culture is growing coli and Enterococcus faecalis.  Sensitivities reviewed.  Patient was changed over to amoxicillin.  We will plan for a 7-day treatment.  Blood cultures are negative so far.  Procalcitonin was elevated at 1.24.  Lactic acid was elevated at 4.4 and improved to 2.4. Seems to be stable from a infection standpoint.  Sepsis has resolved.  Patient with history of nephrolithiasis. Last UTI in March 2021. Since renal function has improved and patient without abdominal pain there is no need to do abdominal imaging. But patient may benefit from seeing Urology in  the outpatient setting.  Fall/pain in the left lower extremity Per patient he did not have any syncopal episode.  He apparently slid off his bed.  Likely a mechanical fall. CT head did not show any acute findings. Denied any pain but he did have limited range of motion of his left lower extremity.  X-ray was done which showed previous arthroplasty but no acute findings.  Seems to be better.  PT and OT evaluation completed.  SNF recommended.    Elevated troponin in the setting of known coronary artery disease Significant elevation in troponin noted.  Patient denied any chest pain.  EKG was similar to previous.  Cardiology was consulted.  Thought to be demand  ischemia.  He remains on apixaban.  He is also on statin.  He has known CAD with PCI in 2019.  Cardiac medications on hold due to hypotension.  History of chronic systolic CHF status post AICD Patient with history of nonischemic cardiomyopathy.  Strict ins and outs and daily weights.  Recently seen by heart failure clinic.  It looks like he was on torsemide 80 mg twice a day.  He was also asked to take metolazone more frequently than once a week because he was experiencing lower extremity edema.   Blood pressure remains low but improved from before.  Will defer reinitiation of his cardiac meds to cardiology.   Echocardiogram was repeated and continues to show low EF of 20 to 25%.  Acute on chronic kidney disease stage IIIb/hypokalemia Patient's baseline creatinine is around 1.9.  He came in with a BUN of 75 and creatinine of 2.89.  Due to sepsis as well as recent increased diuretic use. Diuretics were placed on hold.  He was gently hydrated per sepsis protocol.   Renal function has improved.  Back to his baseline.  Paroxysmal atrial fibrillation On Eliquis and amiodarone which are being continued.  Hypothyroidism Continue with levothyroxine.  Essential hypertension Noted to have borderline low blood pressures.  Holding his antihypertensives.  Monitor closely.  He is asymptomatic.  Normocytic anemia Baseline hemoglobin noted between 8 and 9.  Hemoglobin dropped down to 7.7 likely dilutional.  No overt bleeding.  Seems to have improved.  Hold off on transfusions.  Hyperlipidemia Continue statin.  History of depression Continue Zoloft.  History of gout Holding allopurinol  History of BPH Holding his tamsulosin and finasteride.  These were held due to hypotension.  Finasteride was resumed.  Chronic back pain Uses Norco at home.  Holding due to low blood pressures.  Pain is reasonably well controlled.  Can use Tylenol.  Can also use topical analgesics.  Obesity Estimated body mass  index is 31.39 kg/m as calculated from the following:   Height as of this encounter: 5\' 7"  (1.702 m).   Weight as of this encounter: 90.9 kg.   DVT Prophylaxis: On Eliquis Code Status: Full code Family Communication: Discussed with the patient Disposition Plan: Lives with his wife.  SNF recommended by PT and OT.  Status is: Inpatient  Remains inpatient appropriate because:IV treatments appropriate due to intensity of illness or inability to take PO and Inpatient level of care appropriate due to severity of illness   Dispo: The patient is from: Home              Anticipated d/c is to: SNF              Anticipated d/c date is: 1-3 days.  Patient currently is not medically stable to d/c. Needs to be cleared by cardiology.     Medications:  Scheduled: . amiodarone  200 mg Oral Daily  . amoxicillin  500 mg Oral Q8H  . apixaban  2.5 mg Oral BID  . finasteride  5 mg Oral Daily  . iron polysaccharides  150 mg Oral Daily  . levothyroxine  125 mcg Oral QAC breakfast  . lidocaine  1 patch Transdermal Q24H  . polyethylene glycol  17 g Oral BID  . rosuvastatin  10 mg Oral q1800  . senna-docusate  1 tablet Oral BID  . sertraline  25 mg Oral Daily   Continuous:  JIR:[id number] **OR** acetaminophen, benzonatate, diclofenac Sodium, ipratropium-albuterol, melatonin, ondansetron **OR** ondansetron (ZOFRAN) IV   Objective:  Vital Signs  Vitals:   08/20/20 0200 08/20/20 0346 08/20/20 0645 08/20/20 0759  BP:  104/67  101/67  Pulse: 81 80 77 78  Resp: 18 20 18 20   Temp:  98.7 F (37.1 C)  98.2 F (36.8 C)  TempSrc:  Axillary  Oral  SpO2: 97% 97% 97% 98%  Weight:  90.9 kg    Height:        Intake/Output Summary (Last 24 hours) at 08/20/2020 1031 Last data filed at 08/20/2020 08/22/2020 Gross per 24 hour  Intake 480 ml  Output 775 ml  Net -295 ml   Filed Weights   08/16/20 1024 08/20/20 0346  Weight: 88.3 kg 90.9 kg    General appearance: Awake alert.  In no  distress Resp: Clear to auscultation bilaterally.  Normal effort Cardio: S1-S2 is normal regular.  No S3-S4.  No rubs murmurs or bruit GI: Abdomen is soft.  Nontender nondistended.  Bowel sounds are present normal.  No masses organomegaly Extremities: No edema.  Moving all his extremities Neurologic: No focal neurological deficits.      Lab Results:  Data Reviewed: I have personally reviewed following labs and imaging studies  CBC: Recent Labs  Lab 08/16/20 0948 08/16/20 0948 08/16/20 1611 08/17/20 0716 08/18/20 0144 08/19/20 0431 08/20/20 0354  WBC 7.3   < > 7.3 7.1 7.1 7.3 6.0  NEUTROABS 6.0  --   --   --   --   --   --   HGB 8.2*   < > 8.0* 7.7* 7.9* 8.0* 8.6*  HCT 26.1*   < > 25.5* 25.6* 25.4* 26.7* 28.1*  MCV 89.4   < > 87.6 89.8 90.4 92.4 91.8  PLT 254   < > 211 214 216 228 225   < > = values in this interval not displayed.    Basic Metabolic Panel: Recent Labs  Lab 08/16/20 0948 08/16/20 0948 08/16/20 1611 08/17/20 0716 08/18/20 0144 08/19/20 0431 08/20/20 0354  NA 135  --   --  139 140 138 137  K 3.5  --   --  3.2* 3.4* 4.2 4.3  CL 97*  --   --  104 106 105 104  CO2 21*  --   --  25 26 25 25   GLUCOSE 158*  --   --  94 109* 123* 111*  BUN 75*  --   --  62* 51* 45* 40*  CREATININE 2.89*   < > 2.38* 2.17* 1.91* 1.81* 1.51*  CALCIUM 9.0  --   --  8.6* 8.7* 8.8* 8.7*  MG  --   --  2.0  --   --  2.0  --   PHOS  --   --  4.0  --   --   --   --    < > =  values in this interval not displayed.    GFR: Estimated Creatinine Clearance: 39.1 mL/min (A) (by C-G formula based on SCr of 1.51 mg/dL (H)).  Liver Function Tests: Recent Labs  Lab 08/16/20 0948 08/17/20 0716 08/18/20 0144 08/19/20 0431 08/20/20 0354  AST 82* 94* 87* 71* 64*  ALT 27 27 26 27 27   ALKPHOS 76 63 64 67 67  BILITOT 1.0 1.1 0.8 1.1 1.1  PROT 7.8 7.1 7.0 7.0 6.9  ALBUMIN 2.2* 1.9* 1.9* 1.9* 1.9*    Coagulation Profile: Recent Labs  Lab 08/16/20 0948 08/17/20 0716  INR 1.8*  1.8*    Cardiac Enzymes: Recent Labs  Lab 08/16/20 1150  CKTOTAL 732*    CBG: Recent Labs  Lab 08/16/20 0942 08/17/20 0033  GLUCAP 162* 101*     Recent Results (from the past 240 hour(s))  SARS Coronavirus 2 by RT PCR (hospital order, performed in [facility] hospital lab) Nasopharyngeal Nasopharyngeal Swab     Status: None   Collection Time: 08/16/20  9:40 AM   Specimen: Nasopharyngeal Swab  Result Value Ref Range Status   SARS Coronavirus 2 NEGATIVE NEGATIVE Final    Comment: (NOTE) SARS-CoV-2 target nucleic acids are NOT DETECTED.  The SARS-CoV-2 RNA is generally detectable in upper and lower respiratory specimens during the acute phase of infection. The lowest concentration of SARS-CoV-2 viral copies this assay can detect is 250 copies / mL. A negative result does not preclude SARS-CoV-2 infection and should not be used as the sole basis for treatment or other patient management decisions.  A negative result may occur with improper specimen collection / handling, submission of specimen other than nasopharyngeal swab, presence of viral mutation(s) within the areas targeted by this assay, and inadequate number of viral copies (<250 copies / mL). A negative result must be combined with clinical observations, patient history, and epidemiological information.  Fact Sheet for Patients:   BoilerBrush.com.cy  Fact Sheet for Healthcare Providers: https://pope.com/  This test is not yet approved or  cleared by the [place] FDA and has been authorized for detection and/or diagnosis of SARS-CoV-2 by FDA under an Emergency Use Authorization (EUA).  This EUA will remain in effect (meaning this test can be used) for the duration of the COVID-19 declaration under Section 564(b)(1) of the Act, 21 U.S.C. section 360bbb-3(b)(1), unless the authorization is terminated or revoked sooner.  Performed at [facility],  [address], [place], [place] [address]   Blood culture (routine single)     Status: None (Preliminary result)   Collection Time: 08/16/20 10:04 AM   Specimen: BLOOD RIGHT FOREARM  Result Value Ref Range Status   Specimen Description BLOOD RIGHT FOREARM  Final   Special Requests   Final    BOTTLES DRAWN AEROBIC AND ANAEROBIC Blood Culture results may not be optimal due to an excessive volume of blood received in culture bottles   Culture   Final    NO GROWTH 3 DAYS Performed at [facility], [address] [place], [place] [address]    Report Status PENDING  Incomplete  Urine culture     Status: Abnormal   Collection Time: 08/16/20 12:40 PM   Specimen: In/Out Cath Urine  Result Value Ref Range Status   Specimen Description IN/OUT CATH URINE  Final   Special Requests   Final    NONE Performed at [facility], [address], [place], [place] [address]    Culture (A)  Final    >=  100,000 COLONIES/mL ESCHERICHIA COLI >=100,000 COLONIES/mL ENTEROCOCCUS FAECALIS    Report Status 08/18/2020 FINAL  Final   Organism ID, Bacteria ESCHERICHIA COLI (A)  Final   Organism ID, Bacteria ENTEROCOCCUS FAECALIS (A)  Final      Susceptibility   Escherichia coli - MIC*    AMPICILLIN <=2 SENSITIVE Sensitive     CEFAZOLIN <=4 SENSITIVE Sensitive     CEFTRIAXONE <=0.25 SENSITIVE Sensitive     CIPROFLOXACIN <=0.25 SENSITIVE Sensitive     GENTAMICIN <=1 SENSITIVE Sensitive     IMIPENEM <=0.25 SENSITIVE Sensitive     NITROFURANTOIN <=16 SENSITIVE Sensitive     TRIMETH/SULFA <=20 SENSITIVE Sensitive     AMPICILLIN/SULBACTAM <=2 SENSITIVE Sensitive     PIP/TAZO <=4 SENSITIVE Sensitive     * >=100,000 COLONIES/mL ESCHERICHIA COLI   Enterococcus faecalis - MIC*    AMPICILLIN <=2 SENSITIVE Sensitive     NITROFURANTOIN <=16 SENSITIVE Sensitive     VANCOMYCIN 2 SENSITIVE Sensitive     * >=100,000 COLONIES/mL ENTEROCOCCUS FAECALIS  Culture, blood (single) w Reflex to ID Panel     Status:  None (Preliminary result)   Collection Time: 08/16/20  4:11 PM   Specimen: BLOOD LEFT HAND  Result Value Ref Range Status   Specimen Description BLOOD LEFT HAND  Final   Special Requests   Final    BOTTLES DRAWN AEROBIC ONLY Blood Culture adequate volume   Culture   Final    NO GROWTH 3 DAYS Performed at [facility], [address], [place], [place] [address]    Report Status PENDING  Incomplete  Culture, blood (routine x 2)     Status: None (Preliminary result)   Collection Time: 08/16/20  4:34 PM   Specimen: BLOOD  Result Value Ref Range Status   Specimen Description BLOOD RIGHT ANTECUBITAL  Final   Special Requests AEROBIC BOTTLE ONLY Blood Culture adequate volume  Final   Culture   Final    NO GROWTH 3 DAYS Performed at [facility], [address], [place], [place] [address]    Report Status PENDING  Incomplete      Radiology Studies: No results found.     LOS: 4 days   Jamez Ambrocio [employer] on [email or web address]  08/20/2020, 10:31 AM

## 2020-08-20 NOTE — Consult Note (Addendum)
Advanced Heart Failure Team Consult Note   Primary Physician: [facility] PCP-Cardiologist:  [name], MD  [facility]: Dr. Shirlee Latch   Reason for Consultation: Management of Chronic Systolic Heart Failure   HPI:    Ryan Macdonald is seen today for evaluation of chronic systolic heart failure at the request of Dr. Rito Ehrlich, Internal Medicine.   Ryan Macdonald is a 86 y.o. male who has a history ofsystolic HF due toNICM, Biotronic ICD, A Fibon Eliquis, DMII, HTN,CAD s/p pLAD stent in 07/2018 with residual non-obstructive disease,chronic hypoxic respiratory failure on home O2 PRN,and depression.  Admitted 12/26 - 11/28/18 with A/C systolic CHF. Diuresed 21 lbs and meds adjusted as tolerated. Echo showed EF 20-25%. EP was consulted for RV pacing and ICD settings were adjusted => patient was reprogrammed to VDD, now with narrow QRS with pacing.   Patient was admitted in 3/21 with CHF, AKI and E coli UTI.  He had a prolonged admission, was diuresed on milrinone gtt + Lasix gtt.  Amiodarone was restarted to keep him in NSR.   Echo in 4/21 showed EF 25% with moderate LV dilation and inferior/inferolateral AK; mildly decreased RV systolic function; moderate MR.   He was recently in the [place] hospital 8/21 for CHF exacerbation.  Had initial post hospital f/u w/ Dr. Shirlee Latch on 08/08/20. At that time, he was still volume overloaded, Wt was up ~10 lb and he continued w/ bilateral LEE and orthopnea. He had decreased breath sounds on the right on lung exam, concerning for effusion. CXR was ordered and confirmed interval development of a small-moderate rt pleural effusion w/ cardiomegally and pulmonary vascular congestion c/w mild pulmonary edema. He was instructed to continue torsemide 80 mg bid and to add 2.5 of metolazone. He was instructed to take 3 days of metolazone the first week (Sat, Sun + Wed), followed by once weekly qWednesdays.   He had return f/u on 9/15 and volume status  was improved. His wt was down 8 lb, from 203>>195 lb. BP 110/66. ReDs Clip 29%. Labs showed stable SCr at 2.14, c/w his baseline. K 3.6. He was instructed to continue continue torsemide 80 mg bid + 2.5 of metolazone qWednesdays.  He presented to the [facility] on 9/18 w/ complaint of weakness and a fall at home. There was no LOC and no head trauma. On arrival to the ED, he was tachycardic, tachyphemic and hypotensive, meeting Sepsis criteria. Lactic acid was elevated at 4.4. He was afebrile w/ normal WBC ct. UA was + for UTI. CXR also concerning for possible Rt sided PNA + pulmonary edema w/ small rt pleural effusion. BMP showed an AKI w/ SCr elevated at 2.9. HS troponins were elevated, 732>>1,147>>1,743 but no recent CP. EKG showed ectopic atrial tachycardia 104 bpm. No ischemic abnormalities. Head CT unremarkable.   He was admitted by IM and started on abx + given IVF bolus. HF/BP active meds held including diuretics. Initially received Rocephin and azithromycin in ED. Urine cultures grew Ecoli + Enterococcus faecalis. Blood cultures NGTD. Abx changed. He is now on amoxicillin 500 q8h.   He is feeling better today. BP improved but remains soft, in the low 100s systolic. AKI resolved, SCr 1.51 today. Remains AF. WBC normal at 6.0. Wt is trending up off diuretics, from 194 on admit to 200 lb today. He denies CP. No dyspnea. O2 sats 98% on RA. No dysuria. Continues w/ dry cough.   General cardiology was initially consulted for elevated troponin, which is felt  most likely 2/2 demand ischemia in the setting of Sepsis and AKI. Echo has been repeated and LVEF fairly stable, 20-25% w/ diffuse hypokinesis. No RWMA. RV is mildly reduced. There is at least moderate aortic valve stenosis. LVOT/AV VTI ratio: 0.45    Echo 08/17/20 1. Left ventricular ejection fraction, by estimation, is 20 to 25%. The left ventricle has severely decreased function. The left ventricle demonstrates global hypokinesis. The left  ventricular internal cavity size was severely dilated. Left ventricular diastolic parameters are indeterminate. 2. Right ventricular systolic function is mildly reduced. The right ventricular size is normal. There is normal pulmonary artery systolic pressure. The estimated right ventricular systolic pressure is 19.4 mmHg. 3. Left atrial size was moderately dilated. 4. The mitral valve is normal in structure. Mild mitral valve regurgitation. No evidence of mitral stenosis. 5. The aortic valve has an indeterminant number of cusps. There is severe calcifcation of the aortic valve. There is severe thickening of the aortic valve. Aortic valve regurgitation is not visualized. There is at least Moderate aortic valve stenosis and possibly severe by calculated AVA but the mean and peak transaortic gradients are low due to low flow low output AS. SVI is low at 21. Aortic valve area, by VTI measures 1.27 cm. Aortic valve mean gradient measures 6.0 mmHg. 6. The inferior vena cava is dilated in size with >50% respiratory variability, suggesting right atrial pressure of 8 mmHg.  Review of Systems: [y] = yes, [ ]  = no   . General: Weight gain [ ] ; Weight loss [ ] ; Anorexia [Y ]; Fatigue [Y ]; Fever [ ] ; Chills [ ] ; Weakness [Y ]  . Cardiac: Chest pain/pressure [ ] ; Resting SOB [ ] ; Exertional SOB [ ] ; Orthopnea [ ] ; Pedal Edema [ ] ; Palpitations [ ] ; Syncope [ ] ; Presyncope [ ] ; Paroxysmal nocturnal dyspnea[ ]   . Pulmonary: Cough [Y ]; Wheezing[Y ]; Hemoptysis[ ] ; Sputum [ ] ; Snoring [ ]   . GI: Vomiting[ ] ; Dysphagia[ ] ; Melena[ ] ; Hematochezia [ ] ; Heartburn[ ] ; Abdominal pain [ ] ; Constipation [ ] ; Diarrhea [ ] ; BRBPR [ ]   . GU: Hematuria[ ] ; Dysuria [ ] ; Nocturia[ ]   . Vascular: Pain in legs with walking [ ] ; Pain in feet with lying flat [ ] ; Non-healing sores [ ] ; Stroke [ ] ; TIA [ ] ; Slurred speech [ ] ;  . Neuro: Headaches[ ] ; Vertigo[ ] ; Seizures[ ] ; Paresthesias[ ] ;Blurred vision [ ] ; Diplopia [  ]; Vision changes [ ]   . Ortho/Skin: Arthritis [ ] ; Joint pain [ ] ; Muscle pain [ ] ; Joint swelling [ ] ; Back Pain [ ] ; Rash [ ]   . Psych: Depression[ ] ; Anxiety[ ]   . Heme: Bleeding problems [ ] ; Clotting disorders [ ] ; Anemia [Y ]  . Endocrine: Diabetes [ Y]; Thyroid dysfunction[ ]   Home Medications Prior to Admission medications   Medication Sig Start Date End Date Taking? Authorizing Provider  allopurinol (ZYLOPRIM) 100 MG tablet Take 200 mg by mouth daily.    Yes [provider]  amiodarone (PACERONE) 200 MG tablet Take 1 tablet (200 mg total) by mouth daily. 05/29/20  Yes Laurey Morale, MD  apixaban (ELIQUIS) 2.5 MG TABS tablet Take 1 tablet (2.5 mg total) by mouth 2 (two) times daily. 03/27/20  Yes Laurey Morale, MD  cyanocobalamin 1000 MCG tablet Take 1 tablet (1,000 mcg total) by mouth daily. 03/13/19  Yes Laurey Morale, MD  diclofenac Sodium (VOLTAREN) 1 % GEL Apply 4 g topically 4 (four) times daily. Patient  taking differently: Apply 4 g topically 2 (two) times daily as needed.  02/24/20  Yes Sheikh, Omair Latif, DO  finasteride (PROSCAR) 5 MG tablet Take 1 tablet (5 mg total) by mouth daily. Patient taking differently: Take 5 mg by mouth in the morning and at bedtime.  11/29/18  Yes Alford Highland, NP  hydrALAZINE (APRESOLINE) 25 MG tablet Take 0.5 tablets (12.5 mg total) by mouth every 8 (eight) hours. 02/24/20  Yes Sheikh, Omair Latif, DO  ipratropium-albuterol (DUONEB) 0.5-2.5 (3) MG/3ML SOLN Take 3 mLs by nebulization every 4 (four) hours as needed. 05/14/17  Yes Pearson Grippe, MD  isosorbide dinitrate (ISORDIL) 10 MG tablet Take 10 mg by mouth 3 (three) times daily.   Yes [provider]  levothyroxine (SYNTHROID) 125 MCG tablet Take 1 tablet (125 mcg total) by mouth daily before breakfast. 08/01/19  Yes Carlus Pavlov, MD  magnesium oxide (MAG-OX) 400 MG tablet Take 400 mg by mouth daily.    Yes [provider]  metolazone (ZAROXOLYN) 2.5 MG tablet  Take 1 tablet (2.5 mg total) by mouth once a week. On wednesdays 08/08/20 11/06/20 Yes Laurey Morale, MD  polyethylene glycol (MIRALAX / GLYCOLAX) 17 g packet Take 17 g by mouth 2 (two) times daily. 02/24/20  Yes Sheikh, Omair Latif, DO  potassium chloride SA (KLOR-CON) 20 MEQ tablet Take 1 tablet (20 mEq total) by mouth once a week. Only on metolazone days 08/13/20  Yes Sharol Harness, Brittainy M, PA-C  rosuvastatin (CRESTOR) 10 MG tablet Take 1 tablet (10 mg total) by mouth daily at 6 PM. 12/22/18  Yes [name], MD  senna-docusate (SENOKOT-S) 8.6-50 MG tablet Take 1 tablet by mouth 2 (two) times daily. 02/24/20  Yes Sheikh, Omair Latif, DO  sertraline (ZOLOFT) 25 MG tablet Take 25 mg by mouth daily.    Yes [provider]  spironolactone (ALDACTONE) 25 MG tablet Take 12.5 mg by mouth at bedtime.   Yes [provider]  tamsulosin (FLOMAX) 0.4 MG CAPS capsule Take 0.4 mg by mouth at bedtime.   Yes [provider]  torsemide (DEMADEX) 20 MG tablet Take 4 tablets (80 mg total) by mouth in the morning AND 2 tablets (40 mg total) every evening. 08/08/20  Yes Laurey Morale, MD    Past Medical History: Past Medical History:  Diagnosis Date  . AKI (acute kidney injury) (HCC) 02/2019  . Atrial fibrillation (HCC)   . Cardiomyopathy (HCC)   . Cat scratch fever    removed mass on right side neck  . Cataracts, bilateral   . CHF (congestive heart failure) (HCC)   . Chronic kidney disease   . Corns and callosities   . Diabetes mellitus without complication (HCC)    TYPE 2  . Dyspnea   . Erythema intertrigo   . Flat foot   . Gout   . High cholesterol   . Hx of urethral stricture   . Hypertension   . Kidney stones   . Morbid obesity (HCC)   . Nonischemic cardiomyopathy (HCC)    a. ? 2009 Cath in [place] - nl cors per pt;  b. 09/2013 Echo: EF 25-30%, sev diff HK.  . Obesity   . Osteoarthritis   . PAF (paroxysmal atrial fibrillation) (HCC)    a. post-op hip in 2014 - prev  on xarelto.  . Renal insufficiency     Past Surgical History: Past Surgical History:  Procedure Laterality Date  . CARDIAC CATHETERIZATION  1980's  . CARDIOVERSION N/A  01/15/2020   Procedure: CARDIOVERSION;  Surgeon: Laurey Morale, MD;  Location: [facility] ENDOSCOPY;  Service: Cardiovascular;  Laterality: N/A;  . CIRCUMCISION  1940's  . CORONARY STENT INTERVENTION N/A 08/18/2018   Procedure: CORONARY STENT INTERVENTION;  Surgeon: Corky Crafts, MD;  Location: [facility] INVASIVE CV LAB;  Service: Cardiovascular;  Laterality: N/A;  . CYSTOSCOPY WITH URETHRAL DILATATION  10/23/2013  . CYSTOSCOPY WITH URETHRAL DILATATION N/A 10/23/2013   Procedure: CYSTOSCOPY WITH URETHRAL DILATATION;  Surgeon: Kathi Ludwig, MD;  Location: [facility] OR;  Service: Urology;  Laterality: N/A;  . INCISION AND DRAINAGE OF WOUND Right 1980's   "cat scratch" (10/23/2013)  . INGUINAL HERNIA REPAIR Right 1950's  . INTRAVASCULAR ULTRASOUND/IVUS N/A 08/18/2018   Procedure: Intravascular Ultrasound/IVUS;  Surgeon: Corky Crafts, MD;  Location: [facility] INVASIVE CV LAB;  Service: Cardiovascular;  Laterality: N/A;  . KIDNEY STONE SURGERY Right 1970's; 1983   "twice"  . LEFT HEART CATH AND CORONARY ANGIOGRAPHY N/A 08/18/2018   Procedure: LEFT HEART CATH AND CORONARY ANGIOGRAPHY;  Surgeon: Corky Crafts, MD;  Location: [facility] INVASIVE CV LAB;  Service: Cardiovascular;  Laterality: N/A;  . RIGHT HEART CATH N/A 02/19/2020   Procedure: RIGHT HEART CATH;  Surgeon: Laurey Morale, MD;  Location: [facility] INVASIVE CV LAB;  Service: Cardiovascular;  Laterality: N/A;  . TEE WITHOUT CARDIOVERSION N/A 01/15/2020   Procedure: TRANSESOPHAGEAL ECHOCARDIOGRAM (TEE);  Surgeon: Laurey Morale, MD;  Location: [facility] ENDOSCOPY;  Service: Cardiovascular;  Laterality: N/A;  . TONSILLECTOMY AND ADENOIDECTOMY  1940's  . TOTAL HIP ARTHROPLASTY Left 10/23/2013   Procedure: TOTAL HIP ARTHROPLASTY;  Surgeon: Valeria Batman, MD;  Location: [facility] OR;  Service:  Orthopedics;  Laterality: Left;    Family History: Family History  Problem Relation Age of Onset  . Heart disease Mother   . Heart Problems Maternal Grandmother   . Other Other        negative for premature CAD    Social History: Social History   Socioeconomic History  . Marital status: Married    Spouse name: Not on file  . Number of children: Not on file  . Years of education: Not on file  . Highest education level: Not on file  Occupational History  . Occupation: Retired    Comment: Former Public house manager  Tobacco Use  . Smoking status: Former Smoker    Packs/day: 1.00    Years: 15.00    Pack years: 15.00    Types: Cigarettes, Cigars  . Smokeless tobacco: Former Neurosurgeon  . Tobacco comment: quit smoking ~ 50 yr ago  Vaping Use  . Vaping Use: Never used  Substance and Sexual Activity  . Alcohol use: No    Alcohol/week: 2.0 standard drinks    Types: 2 Cans of beer per week    Comment: rare beer.  . Drug use: No  . Sexual activity: Yes  Other Topics Concern  . Not on file  Social History Narrative   Lives in [place] with wife. Moved from [place] (2013). Does not routinely exercise.   Social Determinants of Health   Financial Resource Strain:   . Difficulty of Paying Living Expenses: Not on file  Food Insecurity:   . Worried About Programme researcher, broadcasting/film/video in the Last Year: Not on file  . Ran Out of Food in the Last Year: Not on file  Transportation Needs:   . Lack of Transportation (Medical): Not on file  . Lack of Transportation (Non-Medical): Not on file  Physical Activity:   .  Days of Exercise per Week: Not on file  . Minutes of Exercise per Session: Not on file  Stress:   . Feeling of Stress : Not on file  Social Connections:   . Frequency of Communication with Friends and Family: Not on file  . Frequency of Social Gatherings with Friends and Family: Not on file  . Attends Religious Services: Not on file  . Active Member of Clubs or Organizations: Not on file  .  Attends Banker Meetings: Not on file  . Marital Status: Not on file    Allergies:  Allergies  Allergen Reactions  . Lisinopril Swelling    Facial swelling  . Xarelto [Rivaroxaban] Other (See Comments)    "Blood came out of my penis"    Objective:    Vital Signs:   Temp:  [97.4 F (36.3 C)-98.7 F (37.1 C)] 98.2 F (36.8 C) (09/22 0759) Pulse Rate:  [77-81] 78 (09/22 0759) Resp:  [18-22] 20 (09/22 0759) BP: (98-108)/(61-67) 101/67 (09/22 0759) SpO2:  [96 %-100 %] 98 % (09/22 0759) Weight:  [90.9 kg] 90.9 kg (09/22 0346) Last BM Date: 08/19/20  Weight change: Filed Weights   08/16/20 1024 08/20/20 0346  Weight: 88.3 kg 90.9 kg    Intake/Output:   Intake/Output Summary (Last 24 hours) at 08/20/2020 1017 Last data filed at 08/20/2020 2130 Gross per 24 hour  Intake 480 ml  Output 775 ml  Net -295 ml      Physical Exam    General:  Elderly male, well appearing. Sitting up in chair. No resp difficulty HEENT: normal Neck: supple. JVP ~ 8 cm . Carotids 2+ bilat; no bruits. No lymphadenopathy or thyromegaly appreciated. Cor: PMI nondisplaced. Regular rate & rhythm. 2/6 AS murmur  Lungs: Rt middle lobe crackles and faint expiratory wheezing, left lung field w/ slight crackles at the base, otherwise clear Abdomen: obese, soft, nontender, nondistended. No hepatosplenomegaly. No bruits or masses. Good bowel sounds. Extremities: no cyanosis, clubbing, rash, edema Neuro: alert & orientedx3, cranial nerves grossly intact. moves all 4 extremities w/o difficulty. Affect pleasant   Telemetry   NSR 80s   EKG    Admit EKG showed ectopic atrial tachycardia, 104 bpm   Labs   Basic Metabolic Panel: Recent Labs  Lab 08/16/20 0948 08/16/20 0948 08/16/20 1611 08/17/20 0716 08/17/20 0716 08/18/20 0144 08/19/20 0431 08/20/20 0354  NA 135  --   --  139  --  140 138 137  K 3.5  --   --  3.2*  --  3.4* 4.2 4.3  CL 97*  --   --  104  --  106 105 104  CO2 21*   --   --  25  --  26 25 25   GLUCOSE 158*  --   --  94  --  109* 123* 111*  BUN 75*  --   --  62*  --  51* 45* 40*  CREATININE 2.89*   < > 2.38* 2.17*  --  1.91* 1.81* 1.51*  CALCIUM 9.0   < >  --  8.6*   < > 8.7* 8.8* 8.7*  MG  --   --  2.0  --   --   --  2.0  --   PHOS  --   --  4.0  --   --   --   --   --    < > = values in this interval not displayed.    Liver Function Tests: Recent  Labs  Lab 08/16/20 0948 08/17/20 0716 08/18/20 0144 08/19/20 0431 08/20/20 0354  AST 82* 94* 87* 71* 64*  ALT 27 27 26 27 27   ALKPHOS 76 63 64 67 67  BILITOT 1.0 1.1 0.8 1.1 1.1  PROT 7.8 7.1 7.0 7.0 6.9  ALBUMIN 2.2* 1.9* 1.9* 1.9* 1.9*   No results for input(s): LIPASE, AMYLASE in the last 168 hours. No results for input(s): AMMONIA in the last 168 hours.  CBC: Recent Labs  Lab 08/16/20 0948 08/16/20 0948 08/16/20 1611 08/17/20 0716 08/18/20 0144 08/19/20 0431 08/20/20 0354  WBC 7.3   < > 7.3 7.1 7.1 7.3 6.0  NEUTROABS 6.0  --   --   --   --   --   --   HGB 8.2*   < > 8.0* 7.7* 7.9* 8.0* 8.6*  HCT 26.1*   < > 25.5* 25.6* 25.4* 26.7* 28.1*  MCV 89.4   < > 87.6 89.8 90.4 92.4 91.8  PLT 254   < > 211 214 216 228 225   < > = values in this interval not displayed.    Cardiac Enzymes: Recent Labs  Lab 08/16/20 1150  CKTOTAL 732*    BNP: BNP (last 3 results) Recent Labs    02/11/20 1739 08/17/20 0716  BNP 1,569.0* 2,349.4*    ProBNP (last 3 results) No results for input(s): PROBNP in the last 8760 hours.   CBG: Recent Labs  Lab 08/16/20 0942 08/17/20 0033  GLUCAP 162* 101*    Coagulation Studies: No results for input(s): LABPROT, INR in the last 72 hours.   Imaging    No results found.   Medications:     Current Medications: . amiodarone  200 mg Oral Daily  . amoxicillin  500 mg Oral Q8H  . apixaban  2.5 mg Oral BID  . finasteride  5 mg Oral Daily  . iron polysaccharides  150 mg Oral Daily  . levothyroxine  125 mcg Oral QAC breakfast  . lidocaine   1 patch Transdermal Q24H  . polyethylene glycol  17 g Oral BID  . rosuvastatin  10 mg Oral q1800  . senna-docusate  1 tablet Oral BID  . sertraline  25 mg Oral Daily     Infusions:    Assessment/Plan   1. UTI Sepsis/ ? PNA: - improved, s/p IVF resuscitation. Continues on abx - UA + for UTI and CXR w/ ? Rt sided PNA - COVID negative  - UC grew Ecoli + Enterococcus faecalis - Blood Cultures negative  - treated initially w/ Rocephin and azithromycin. Now on Augmentin 800 q8h - abx duration per IM   2. AKI on CKD, Stage III - baseline SCr ~2.0 - 2.9 on admit, in setting of sepsis/ hypotension  - improved w/ IVFs - SCr down to 1.5 today  - HF/BP active meds remain on hold (SBP low 100s) but fluid status trending back up - plan to resume torsemide soon, will follow BMP   3. Chronic Systolic Heart Failure  - Primarily nonischemic cardiomyopathy. EF has been low since at least 2014. He has single chamber Biotronik ICD. QRS not wide enough for CRT.Settings adjusted in the past to prevent RV pacing in the face of a long 1st degree AV block.  Echo in 4/21 showed EF 25% with moderate LV dilation and inferior/inferolateral AK; mildly decreased RV systolic function; moderate MR. Echo this admit w/ stable EF, 20-25%, diffuse HK and mildly reduced RV function.  - HF meds held on  admit due to sepsis w/ hypotension and AKI  - volume status ok but wt starting to trend back up, 194>>200 lb  - will need to at least resume PO torsemide soon. Continue to hold metolazone for now.  - his BP may tolerate low dose spiro 12.5 qhs but would hold off on restarting hydral and isordil for now w/ SBPs still in the low 100s   4. Elevated HS Troponin  - HS trop trend: 732>>1,147>>1,743 - EKG w/ no ischemic ST changes and no recent CP - Echo w/ stable EF and no RWMAs - suspect 2/2 demand ischemia in the setting of sepsis and AKI  - will check repeat Hs troponin. No plans for ischemic eval if trending  down  5. CAD - S/p PCI 07/2018 to pLAD but CAD does not explain his cardiomyopathy.   - no recent CP  - He is off Plavix now, on Eliquis for his atrial fibrillation.  - Continue Crestor  8. Aortic Stenosis  - There is at least moderate aortic valve stenosis - there was ? Regarding possible low flow, low gradient AS in setting of severe LV dysfunction, however LVOT/AV VTI ratio: 0.45. Not suggestive of severe AS    9. PAF - in NSR on tele - Continue Eliquis 2.5 mg bid (age, creatinine > 1.5).  - Continue amiodarone 200 mg daily  10. Chronic Iron Deficiency Anemia - Hgb 8.6 c/w baseline  - continue PO Fe     Length of Stay: [address] PA-C  08/20/2020, 10:17 AM  Advanced Heart Failure Team Pager [phone] (M-F; 7a - 4p)  Please contact [facility] for night-coverage after hours (4p -7a ) and weekends on [email or web address]  Patient seen with PA, agree with the above note.   He was admitted again with UTI and urosepsis (Had admission a month or two ago at the [place] with urosepsis).  Urine grew E coli and E faecalis.  He was initially hypotensive/septic.  Diuretics, hydral/isordil, and spironolactone stopped.  HS-TnI was elevated to 1743 in setting of hypotension and AKI (creatinine to 2.9).  He has improved with antibiotics, now on amoxicillin.  Weight is up about 6 lbs since holding diuretics.  Creatinine down to 1.5 (below his usual baseline).  SBP in 100s.  He is eating lunch currently, denies dyspnea.    General: NAD Neck: No JVD, no thyromegaly or thyroid nodule.  Lungs: Clear to auscultation bilaterally with normal respiratory effort. CV: Nondisplaced PMI.  Heart regular S1/S2, no S3/S4, 2/6 SEM RUSB with clear S2.  No peripheral edema.  No carotid bruit. Difficult to palpate pedal pulses.  Abdomen: Soft, nontender, no hepatosplenomegaly, no distention.  Skin: Intact without lesions or rashes.  Neurologic: Alert and oriented x 3.  Psych: Normal affect. Extremities: No  clubbing or cyanosis.  HEENT: Normal.   Echo was reviewed, this appears stable with EF 20-25%, mildly decreased RV systolic function.  I do not think that the aortic stenosis is severe, suspect no more than moderate.  Weight is up with holding diuretics.  BP remains soft in 100s. On exam, he is not significantly volume overloaded.  - Continue to hold hydralazine/isordil for now.  - He can restart spironolactone 12.5 mg daily.  - Start back on torsemide 80 qam/40 qpm now that he is eating/drinking well, will just give the pm dose today.  Follow creatinine closely.   He appears to be in NSR, confirm with ECG.  Continue amiodarone and weight-adjusted Eliquis.  Elevated troponin was likely demand ischemia with sepsis/hypotension and AKI.  No chest pain.  Continue statin.   This is his second episode of urosepsis in the last couple of months.  ?Need for long-term suppressive antibiotic, may benefit by seeing urologist as outpatient.   [name] 08/20/2020 12:44 PM

## 2020-08-20 NOTE — Progress Notes (Signed)
Physical Therapy Treatment Patient Details Name: Ryan Macdonald MRN: [id number] DOB: 01-11-1934 Today's Date: 08/20/2020    History of Present Illness Patient is a 86 y/o male who presents 08/16/20 with AMS and s/p fall. Admitted with severe sepsis, likely septic shock secondary to UTI. PMH includes NICM EF 20-25%, AICD placement, gout, obesity, PAF on eliquis, PE, DM2, cardiac stenting, CHF, CKD, A-fib, HTN.   PT Comments    Pt progressing with mobility. Today's session focused on transfer and gait training, pt requiring RW and modA. Pt limited by generalized weakness, decreased activity tolerance, impaired problem solving and poor attention; at high risk for falls. Continue to recommend SNF-level therapies to maximize functional mobility and independence.    Follow Up Recommendations  SNF;Supervision for mobility/OOB     Equipment Recommendations  None recommended by PT    Recommendations for Other Services       Precautions / Restrictions Precautions Precautions: Fall Restrictions Weight Bearing Restrictions: No    Mobility  Bed Mobility Overal bed mobility: Needs Assistance Bed Mobility: Supine to Sit     Supine to sit: Mod assist     General bed mobility comments: Improving initiation of bed mobility, requiring frequent cues to stay on task as pt distracted by his own conversation; modA to assist trunk elevation; scooting to EOB well with increased time, effort and cues  Transfers Overall transfer level: Needs assistance Equipment used: Rolling walker (2 wheeled) Transfers: Sit to/from Stand Sit to Stand: Mod assist;+2 safety/equipment         General transfer comment: Initial modA+2 to stand from EOB to RW, pt with decreased initiation and sequencing; 2x trials from recliner to RW with modA+1, improving initiation with cues for hand placement; increased time for all tasks  Ambulation/Gait Ambulation/Gait assistance: Min assist;+2 safety/equipment Gait Distance  (Feet): 10 Feet Assistive device: Rolling walker (2 wheeled) Gait Pattern/deviations: Step-to pattern;Trunk flexed Gait velocity: Decreased   General Gait Details: Slow, guarded gait with RW and minA for balance, close chair follow secondary to pt c/o bilateral knee pain, fearful of falling   Stairs             Wheelchair Mobility    Modified Rankin (Stroke Patients Only)       Balance Overall balance assessment: Needs assistance;History of Falls Sitting-balance support: Single extremity supported;Feet supported Sitting balance-Leahy Scale: Fair     Standing balance support: During functional activity Standing balance-Leahy Scale: Poor Standing balance comment: Requires UE and external support for standing with RW, flexed position in hips/knees.                            Cognition Arousal/Alertness: Awake/alert Behavior During Therapy: WFL for tasks assessed/performed;Flat affect Overall Cognitive Status: No family/caregiver present to determine baseline cognitive functioning Area of Impairment: Memory;Following commands;Safety/judgement;Awareness;Problem solving;Attention                 Orientation Level: Disoriented to;Situation;Time Current Attention Level: Sustained;Selective Memory: Decreased short-term memory Following Commands: Follows one step commands inconsistently;Follows one step commands with increased time Safety/Judgement: Decreased awareness of safety;Decreased awareness of deficits Awareness: Intellectual Problem Solving: Slow processing;Decreased initiation;Difficulty sequencing;Requires verbal cues;Requires tactile cues General Comments: Pt contradictory throughout session. Stating multiple times, "I don't trust therapists." But ultimately agreeable to participate and appreciative of session      Exercises      General Comments General comments (skin integrity, edema, etc.): DOE 2-3/4 with activity, SpO2 97% on RA  Pertinent Vitals/Pain Pain Assessment: 0-10 Pain Score: 3  Pain Location: Bilateral knees (R>L), lower back Pain Descriptors / Indicators: Discomfort;Guarding Pain Intervention(s): Monitored during session;Limited activity within patient's tolerance    Home Living                      Prior Function            PT Goals (current goals can now be found in the care plan section) Progress towards PT goals: Progressing toward goals    Frequency    Min 2X/week      PT Plan Current plan remains appropriate    Co-evaluation              AM-PAC PT "6 Clicks" Mobility   Outcome Measure  Help needed turning from your back to your side while in a flat bed without using bedrails?: A Lot Help needed moving from lying on your back to sitting on the side of a flat bed without using bedrails?: A Lot Help needed moving to and from a bed to a chair (including a wheelchair)?: A Lot Help needed standing up from a chair using your arms (e.g., wheelchair or bedside chair)?: A Lot Help needed to walk in hospital room?: A Little Help needed climbing 3-5 steps with a railing? : A Lot 6 Click Score: 13    End of Session Equipment Utilized During Treatment: Gait belt Activity Tolerance: Patient tolerated treatment well Patient left: in chair;with call bell/phone within reach;with chair alarm set Nurse Communication: Mobility status PT Visit Diagnosis: Pain;Muscle weakness (generalized) (M62.81);Unsteadiness on feet (R26.81);Difficulty in walking, not elsewhere classified (R26.2) Pain - part of body: Leg     Time: 4782-9562 PT Time Calculation (min) (ACUTE ONLY): 25 min  Charges:  $Therapeutic Activity: 23-37 mins                    Ina Homes, PT, DPT Acute Rehabilitation Services  Pager [phone] Office [phone]  Malachy Chamber 08/20/2020, 12:19 PM

## 2020-08-21 ENCOUNTER — Other Ambulatory Visit (HOSPITAL_COMMUNITY): Payer: No Typology Code available for payment source

## 2020-08-21 LAB — CULTURE, BLOOD (SINGLE)
Culture: NO GROWTH
Culture: NO GROWTH
Special Requests: ADEQUATE

## 2020-08-21 LAB — COMPREHENSIVE METABOLIC PANEL
ALT: 32 U/L (ref 0–44)
AST: 81 U/L — ABNORMAL HIGH (ref 15–41)
Albumin: 1.9 g/dL — ABNORMAL LOW (ref 3.5–5.0)
Alkaline Phosphatase: 76 U/L (ref 38–126)
Anion gap: 11 (ref 5–15)
BUN: 35 mg/dL — ABNORMAL HIGH (ref 8–23)
CO2: 22 mmol/L (ref 22–32)
Calcium: 8.7 mg/dL — ABNORMAL LOW (ref 8.9–10.3)
Chloride: 104 mmol/L (ref 98–111)
Creatinine, Ser: 1.49 mg/dL — ABNORMAL HIGH (ref 0.61–1.24)
GFR calc Af Amer: 49 mL/min — ABNORMAL LOW (ref >60)
GFR calc non Af Amer: 42 mL/min — ABNORMAL LOW (ref >60)
Glucose, Bld: 66 mg/dL — ABNORMAL LOW (ref 70–99)
Potassium: 4 mmol/L (ref 3.5–5.1)
Sodium: 137 mmol/L (ref 135–145)
Total Bilirubin: 0.9 mg/dL (ref 0.3–1.2)
Total Protein: 7.1 g/dL (ref 6.5–8.1)

## 2020-08-21 LAB — CULTURE, BLOOD (ROUTINE X 2)
Culture: NO GROWTH
Special Requests: ADEQUATE

## 2020-08-21 LAB — RESPIRATORY PANEL BY RT PCR (FLU A&B, COVID)
Influenza A by PCR: NEGATIVE
Influenza B by PCR: NEGATIVE
SARS Coronavirus 2 by RT PCR: NEGATIVE

## 2020-08-21 LAB — GLUCOSE, CAPILLARY: Glucose-Capillary: 80 mg/dL (ref 70–99)

## 2020-08-21 LAB — MAGNESIUM: Magnesium: 1.6 mg/dL — ABNORMAL LOW (ref 1.7–2.4)

## 2020-08-21 LAB — CBC
HCT: 27.9 % — ABNORMAL LOW (ref 39.0–52.0)
Hemoglobin: 8.4 g/dL — ABNORMAL LOW (ref 13.0–17.0)
MCH: 27.8 pg (ref 26.0–34.0)
MCHC: 30.1 g/dL (ref 30.0–36.0)
MCV: 92.4 fL (ref 80.0–100.0)
Platelets: 246 10*3/uL (ref 150–400)
RBC: 3.02 MIL/uL — ABNORMAL LOW (ref 4.22–5.81)
RDW: 26 % — ABNORMAL HIGH (ref 11.5–15.5)
WBC: 6.7 10*3/uL (ref 4.0–10.5)
nRBC: 0 % (ref 0.0–0.2)

## 2020-08-21 MED ORDER — SACCHAROMYCES BOULARDII 250 MG PO CAPS: 250.0000 mg | ORAL_CAPSULE | Freq: Two times a day (BID) | ORAL | Status: AC

## 2020-08-21 MED ORDER — TORSEMIDE 20 MG PO TABS: 80.0000 mg | ORAL_TABLET | Freq: Two times a day (BID) | ORAL | Status: DC

## 2020-08-21 MED ORDER — BENZONATATE 100 MG PO CAPS: 100.0000 mg | ORAL_CAPSULE | Freq: Three times a day (TID) | ORAL | 0 refills | Status: DC | PRN

## 2020-08-21 MED ORDER — TORSEMIDE 20 MG PO TABS
80.0000 mg | ORAL_TABLET | Freq: Two times a day (BID) | ORAL | 11 refills | Status: DC
Start: 2020-08-21 — End: 2020-09-25

## 2020-08-21 MED ORDER — AMOXICILLIN 500 MG PO CAPS: 500.0000 mg | ORAL_CAPSULE | Freq: Three times a day (TID) | ORAL | Status: AC

## 2020-08-21 MED ORDER — POLYSACCHARIDE IRON COMPLEX 150 MG PO CAPS: 150.0000 mg | ORAL_CAPSULE | Freq: Every day | ORAL | Status: DC

## 2020-08-21 MED ORDER — HYDROCODONE-ACETAMINOPHEN 5-325 MG PO TABS
1.0000 | ORAL_TABLET | Freq: Three times a day (TID) | ORAL | 0 refills | Status: DC | PRN
Start: 2020-08-21 — End: 2020-11-15

## 2020-08-21 MED ORDER — LIDOCAINE 5 % EX PTCH: 1.0000 | MEDICATED_PATCH | CUTANEOUS | 0 refills | Status: AC

## 2020-08-21 NOTE — Progress Notes (Addendum)
Advanced Heart Failure Rounding Note  PCP-Cardiologist: [name], MD   Subjective:    SBP low 100s. SCr stable at 1.49. Brief NSVT on tele  In bed. No complaints. Hasn't ambulated yet today.    Objective:   Weight Range: 91 kg Body mass index is 31.42 kg/m.   Vital Signs:   Temp:  [97.8 F (36.6 C)-98.7 F (37.1 C)] 97.8 F (36.6 C) (09/23 0724) Pulse Rate:  [77-82] 79 (09/23 0724) Resp:  [18-25] 20 (09/23 0724) BP: (96-113)/(61-67) 104/61 (09/23 0724) SpO2:  [96 %-100 %] 97 % (09/23 0724) Weight:  [91 kg] 91 kg (09/23 0420) Last BM Date: 08/20/20  Weight change: Filed Weights   08/16/20 1024 08/20/20 0346 08/21/20 0420  Weight: 88.3 kg 90.9 kg 91 kg    Intake/Output:   Intake/Output Summary (Last 24 hours) at 08/21/2020 0919 Last data filed at 08/21/2020 0818 Gross per 24 hour  Intake 150 ml  Output 1300 ml  Net -1150 ml      Physical Exam    General:  Elderly male in bed. No resp difficulty HEENT: Normal Neck: Supple. JVP . Carotids 2+ bilat; no bruits. No lymphadenopathy or thyromegaly appreciated. Cor: PMI nondisplaced. Regular rate & rhythm. No rubs, gallops or murmurs. Lungs: Clear Abdomen: Soft, nontender, nondistended. No hepatosplenomegaly. No bruits or masses. Good bowel sounds. Extremities: No cyanosis, clubbing, rash, trace bilateral LE edema Neuro: Alert & orientedx3, cranial nerves grossly intact. moves all 4 extremities w/o difficulty. Affect pleasant   Telemetry   NSR w/ brief NSVT   EKG    No new EKG to review   Labs    CBC Recent Labs    08/20/20 0354 08/21/20 0441  WBC 6.0 6.7  HGB 8.6* 8.4*  HCT 28.1* 27.9*  MCV 91.8 92.4  PLT 225 246   Basic Metabolic Panel Recent Labs    38/10/17 0431 08/19/20 0431 08/20/20 0354 08/21/20 0441  NA 138   < > 137 137  K 4.2   < > 4.3 4.0  CL 105   < > 104 104  CO2 25   < > 25 22  GLUCOSE 123*   < > 111* 66*  BUN 45*   < > 40* 35*  CREATININE 1.81*   < > 1.51*  1.49*  CALCIUM 8.8*   < > 8.7* 8.7*  MG 2.0  --   --   --    < > = values in this interval not displayed.   Liver Function Tests Recent Labs    08/20/20 0354 08/21/20 0441  AST 64* 81*  ALT 27 32  ALKPHOS 67 76  BILITOT 1.1 0.9  PROT 6.9 7.1  ALBUMIN 1.9* 1.9*   No results for input(s): LIPASE, AMYLASE in the last 72 hours. Cardiac Enzymes No results for input(s): CKTOTAL, CKMB, CKMBINDEX, TROPONINI in the last 72 hours.  BNP: BNP (last 3 results) Recent Labs    02/11/20 1739 08/17/20 0716  BNP 1,569.0* 2,349.4*    ProBNP (last 3 results) No results for input(s): PROBNP in the last 8760 hours.   D-Dimer No results for input(s): DDIMER in the last 72 hours. Hemoglobin A1C No results for input(s): HGBA1C in the last 72 hours. Fasting Lipid Panel No results for input(s): CHOL, HDL, LDLCALC, TRIG, CHOLHDL, LDLDIRECT in the last 72 hours. Thyroid Function Tests No results for input(s): TSH, T4TOTAL, T3FREE, THYROIDAB in the last 72 hours.  Invalid input(s): FREET3  Other results:   Imaging  No results found.   Medications:     Scheduled Medications: . amiodarone  200 mg Oral Daily  . amoxicillin  500 mg Oral Q8H  . apixaban  2.5 mg Oral BID  . finasteride  5 mg Oral Daily  . iron polysaccharides  150 mg Oral Daily  . levothyroxine  125 mcg Oral QAC breakfast  . lidocaine  1 patch Transdermal Q24H  . polyethylene glycol  17 g Oral BID  . rosuvastatin  10 mg Oral q1800  . saccharomyces boulardii  250 mg Oral BID  . senna-docusate  1 tablet Oral BID  . sertraline  25 mg Oral Daily  . spironolactone  12.5 mg Oral Daily  . torsemide  40 mg Oral QPM  . torsemide  80 mg Oral Q breakfast     Infusions:   PRN Medications:  acetaminophen **OR** acetaminophen, benzonatate, diclofenac Sodium, ipratropium-albuterol, melatonin, ondansetron **OR** ondansetron (ZOFRAN) IV   Assessment/Plan    1. UTI Sepsis/ ? PNA: - improved, s/p IVF  resuscitation. Continues on abx - UA + for UTI and CXR w/ ? Rt sided PNA - COVID negative  - UC grew Ecoli + Enterococcus faecalis - Blood Cultures negative  - treated initially w/ Rocephin and azithromycin. Now on amoxicillin 800 q8h - abx duration per IM  - This is his second episode of urosepsis in the last couple of months.  ?Need for long-term suppressive antibiotic, may benefit by seeing urologist as outpatient.   2. AKI on CKD, Stage III - baseline SCr ~2.0 - 2.9 on admit, in setting of sepsis/ hypotension  - improved w/ IVFs - tolerating restart of torsemide and spiro ok  - SCr down to 1.5 today, continue to monitor    3. Chronic Systolic Heart Failure  - Primarily nonischemic cardiomyopathy. EF has been low since at least 2014. He has single chamber Biotronik ICD. QRS not wide enough for CRT.Settings adjusted in the past to prevent RV pacing in the face of a long 1st degree AV block. Echo in 4/21 showed EF 25% with moderate LV dilation and inferior/inferolateral AK; mildly decreased RV systolic function; moderate MR. Echo this admit w/ stable EF, 20-25%, diffuse HK and mildly reduced RV function.  - HF meds held on admit due to sepsis w/ hypotension and AKI, now resolved - home torsemide and spiro restarted 9/22. Tolerating ok thus far - continue torsemide 80 qam/ 40 qpm - continue spiro 12.5 mg daily  - would continue to hold off on restarting hydral and isordil for now w/ SBPs still in the low 100s   4. Elevated HS Troponin  - HS trop trend: 732>>1,147>>1,743>>347  - EKG w/ no ischemic ST changes and no recent CP - Echo w/ stable EF and no RWMAs - suspect 2/2 demand ischemia in the setting of sepsis and AKI  -  No plans for ischemic eval   5. CAD - S/p PCI 07/2018 to pLAD but CAD does not explain his cardiomyopathy.  - no recent CP  - He is off Plavix now, on Eliquis for his atrial fibrillation.  - Continue Crestor  8. Aortic Stenosis  - There is at least  moderate aortic valve stenosis - there was ? Regarding possible low flow, low gradient AS in setting of severe LV dysfunction, however LVOT/AV VTI ratio: 0.45. Not suggestive of severe AS    9. PAF - in NSR on tele - Continue Eliquis 2.5 mg bid (age, creatinine >1.5).  - Continue amiodarone 200 mg  daily  10. Chronic Iron Deficiency Anemia - Hgb 8.4 c/w baseline  - continue PO Fe   11. NSVT - K 4.0 - check Mg level   12. Deconditioning  - continue to ambulate - PT recommending SNF   Length of Stay: [address] Delmer Islam  08/21/2020, 9:19 AM  Advanced Heart Failure Team Pager [phone] (M-F; 7a - 4p)  Please contact [facility] for night-coverage after hours (4p -7a ) and weekends on [email or web address]  Patient seen with PA, agree with the above note.   Creatinine 1.49 this morning, torsemide and spironolactone restarted.  SBP 100s.  Pt in NSR, denies complaints.   General: NAD Neck: JVP 8 cm, no thyromegaly or thyroid nodule.  Lungs: Clear to auscultation bilaterally with normal respiratory effort. CV: Nondisplaced PMI.  Heart regular S1/S2, no S3/S4, no murmur.  No peripheral edema.   Abdomen: Soft, nontender, no hepatosplenomegaly, no distention.  Skin: Intact without lesions or rashes.  Neurologic: Alert and oriented x 3.  Psych: Normal affect. Extremities: No clubbing or cyanosis.  HEENT: Normal.   Urosepsis resolved, doing better.  SBP still in 100s so will not start hydralazine/isordil back yet (can start at followup).   Would increase torsemide back to home dose 80 mg bid and continue spironolactone 12.5 mg daily.  Will need to be careful, he developed significant volume overload after his last hospitalization for urosepsis.    Plan for discharge to SNF today.  We will make followup in CHF clinic.   [name] 08/21/2020 10:51 AM

## 2020-08-21 NOTE — Discharge Summary (Addendum)
[facility]  Physician Discharge Summary   Patient ID: Ryan Macdonald MRN: [id number] DOB/AGE: [id number] 84 y.o.  Admit date: 08/16/2020 Discharge date: 08/21/2020  PCP: [facility]  DISCHARGE DIAGNOSES:  Severe sepsis/septic shock secondary to UTI, improved Urinary tract infection with E. coli and Enterococcus faecalis Coronary artery disease Chronic systolic CHF Hypotension Chronic kidney disease stage IIIb Paroxysmal atrial fibrillation on Eliquis Hypothyroidism Essential hypertension Normocytic anemia History of depression History of gout History of BPH   RECOMMENDATIONS FOR OUTPATIENT FOLLOW UP: 1. Cardiology will arrange outpatient follow-up. 2. Please refer patient to urology for recurrent UTI and nephrolithiasis    Home Health: None. patient going to SNF Equipment/Devices: None at this time  CODE STATUS: Full code  DISCHARGE CONDITION: fair  Diet recommendation: Heart healthy  INITIAL HISTORY: 86 y.o.malewith medical history significant ofparoxysmal A. fib-on Eliquis, PE, type 2 diabetes mellitus, hypertension, hyperlipidemia, nonischemic cardiomyopathy with ejection fraction of 20 to 25% status post AICD placement, CKD stage III, gout, obesity, hypothyroidism brought by EMS for evaluation of fall andsepsis.  In the ED patient was noted to be tachycardic tachypneic with soft blood pressure.  Abnormal UA was noted.  He was found to have elevated troponin.  Started on antibiotics and hospitalized for further management.  Consultations:  Heart failure team  Procedures:  None   HOSPITAL COURSE:   Severe sepsis likely septic shock secondary to UTI with E. coli and Enterococcus faecalis Chest x-ray reported as possibly showing pneumonia on the right side but patient does not have any cough.  No shortness of breath currently.  Unlikely to be an infectious process.   However his urine was noted to be abnormal.  Urine culture is  growing e coli and Enterococcus faecalis.  Sensitivities reviewed.  Patient was changed over to amoxicillin.  Plan for 7-day course. Blood cultures are negative so far.  Procalcitonin was elevated at 1.24.  Lactic acid was elevated at 4.4 and improved to 2.4. Seems to be stable from a infection standpoint.  Sepsis has resolved.  Patient with history of nephrolithiasis. Last UTI in March 2021. Since renal function has improved and patient without abdominal pain there is no need to do abdominal imaging. But patient may benefit from seeing Urology in the outpatient setting.  Fall/pain in the left lower extremity Per patient he did not have any syncopal episode.  He apparently slid off his bed.  Likely a mechanical fall. CT head did not show any acute findings. Denied any pain but he did have limited range of motion of his left lower extremity.  X-ray was done which showed previous arthroplasty but no acute findings.  Seems to be better.  PT and OT evaluation completed.  SNF recommended.    Elevated troponin in the setting of known coronary artery disease Significant elevation in troponin noted.  Patient denied any chest pain.  EKG was similar to previous.  Cardiology was consulted.  Thought to be demand ischemia.  He remains on apixaban.  He is also on statin.  He has known CAD with PCI in 2019.    Being started back on his cardiac medications.  Currently on amiodarone, torsemide and spironolactone.  Continue to hold Imdur hydralazine and metolazone.  History of chronic systolic CHF status post AICD Patient with history of nonischemic cardiomyopathy. Recently seen by heart failure clinic.  It looks like he was on torsemide 80 mg twice a day.  He was also asked to take metolazone more frequently than once a  week because he was experiencing lower extremity edema.   Being followed by heart failure team.  Blood pressure improved but remains low.  Torsemide and spironolactone has been resumed.  He may  continue with amiodarone.  Holding his Imdur hydralazine and metolazone.  Discussed with cardiology this morning.  They have cleared him for discharge. Echocardiogram was repeated and continues to show low EF of 20 to 25%.  Acute on chronic kidney disease stage IIIb/hypokalemia Patient's baseline creatinine is around 1.9.  He came in with a BUN of 75 and creatinine of 2.89.  Due to sepsis as well as recent increased diuretic use. Diuretics were placed on hold.  He was gently hydrated per sepsis protocol.   Renal function has improved.  Back to his baseline.  Paroxysmal atrial fibrillation On Eliquis and amiodarone which are being continued.  Hypothyroidism Continue with levothyroxine.  Essential hypertension Was quite hypotensive at presentation.  He was given IV fluids.  His pants were held initially.  Blood pressures have improved.  Normocytic anemia Baseline hemoglobin noted between 8 and 9.  Hemoglobin dropped down to 7.7 likely dilutional.  No overt bleeding.  Seems to have improved.    Did not require any transfusion here in the hospital.  Hyperlipidemia Continue statin.  History of depression Continue Zoloft.  History of gout Holding allopurinol  History of BPH Holding his tamsulosin and finasteride.  These were held due to hypotension.  Finasteride was resumed.  Continue to hold tamsulosin.  Can be resumed after a week or so if his blood pressure rebounds.  Chronic back pain Apparently uses Norco at home although this is not listed on his home medication list.  Narcotic database was reviewed.  He indeed gets this prescribed regularly.  Last prescription was written on August 9.  Will need to be cautious considering his hypotension. Can use Tylenol.  Can also use topical analgesics.  Obesity Estimated body mass index is 31.39 kg/m as calculated from the following:   Height as of this encounter: 5\' 7"  (1.702 m).   Weight as of this encounter: 90.9  kg.   Noted to be hypoglycemic this morning.  Improved after he was given a snack.  No known history of diabetes.  Likely due to poor oral intake.  Overall he is stable.  Okay for discharge to SNF today.  PERTINENT LABS:  The results of significant diagnostics from this hospitalization (including imaging, microbiology, ancillary and laboratory) are listed below for reference.    Microbiology: Recent Results (from the past 240 hour(s))  SARS Coronavirus 2 by RT PCR (hospital order, performed in [facility] hospital lab) Nasopharyngeal Nasopharyngeal Swab     Status: None   Collection Time: 08/16/20  9:40 AM   Specimen: Nasopharyngeal Swab  Result Value Ref Range Status   SARS Coronavirus 2 NEGATIVE NEGATIVE Final    Comment: (NOTE) SARS-CoV-2 target nucleic acids are NOT DETECTED.  The SARS-CoV-2 RNA is generally detectable in upper and lower respiratory specimens during the acute phase of infection. The lowest concentration of SARS-CoV-2 viral copies this assay can detect is 250 copies / mL. A negative result does not preclude SARS-CoV-2 infection and should not be used as the sole basis for treatment or other patient management decisions.  A negative result may occur with improper specimen collection / handling, submission of specimen other than nasopharyngeal swab, presence of viral mutation(s) within the areas targeted by this assay, and inadequate number of viral copies (<250 copies / mL). A negative result  must be combined with clinical observations, patient history, and epidemiological information.  Fact Sheet for Patients:   BoilerBrush.com.cy  Fact Sheet for Healthcare Providers: https://pope.com/  This test is not yet approved or  cleared by the [place] FDA and has been authorized for detection and/or diagnosis of SARS-CoV-2 by FDA under an Emergency Use Authorization (EUA).  This EUA will remain in effect  (meaning this test can be used) for the duration of the COVID-19 declaration under Section 564(b)(1) of the Act, 21 U.S.C. section 360bbb-3(b)(1), unless the authorization is terminated or revoked sooner.  Performed at [facility], [address], [place], [place] [address]   Blood culture (routine single)     Status: None (Preliminary result)   Collection Time: 08/16/20 10:04 AM   Specimen: BLOOD RIGHT FOREARM  Result Value Ref Range Status   Specimen Description BLOOD RIGHT FOREARM  Final   Special Requests   Final    BOTTLES DRAWN AEROBIC AND ANAEROBIC Blood Culture results may not be optimal due to an excessive volume of blood received in culture bottles   Culture   Final    NO GROWTH 4 DAYS Performed at [facility], [address] [place], [place] [address]    Report Status PENDING  Incomplete  Urine culture     Status: Abnormal   Collection Time: 08/16/20 12:40 PM   Specimen: In/Out Cath Urine  Result Value Ref Range Status   Specimen Description IN/OUT CATH URINE  Final   Special Requests   Final    NONE Performed at [facility], [address], [place], [place] [address]    Culture (A)  Final    >=100,000 COLONIES/mL ESCHERICHIA COLI >=100,000 COLONIES/mL ENTEROCOCCUS FAECALIS    Report Status 08/18/2020 FINAL  Final   Organism ID, Bacteria ESCHERICHIA COLI (A)  Final   Organism ID, Bacteria ENTEROCOCCUS FAECALIS (A)  Final      Susceptibility   Escherichia coli - MIC*    AMPICILLIN <=2 SENSITIVE Sensitive     CEFAZOLIN <=4 SENSITIVE Sensitive     CEFTRIAXONE <=0.25 SENSITIVE Sensitive     CIPROFLOXACIN <=0.25 SENSITIVE Sensitive     GENTAMICIN <=1 SENSITIVE Sensitive     IMIPENEM <=0.25 SENSITIVE Sensitive     NITROFURANTOIN <=16 SENSITIVE Sensitive     TRIMETH/SULFA <=20 SENSITIVE Sensitive     AMPICILLIN/SULBACTAM <=2 SENSITIVE Sensitive     PIP/TAZO <=4 SENSITIVE Sensitive     * >=100,000 COLONIES/mL ESCHERICHIA COLI   Enterococcus  faecalis - MIC*    AMPICILLIN <=2 SENSITIVE Sensitive     NITROFURANTOIN <=16 SENSITIVE Sensitive     VANCOMYCIN 2 SENSITIVE Sensitive     * >=100,000 COLONIES/mL ENTEROCOCCUS FAECALIS  Culture, blood (single) w Reflex to ID Panel     Status: None (Preliminary result)   Collection Time: 08/16/20  4:11 PM   Specimen: BLOOD LEFT HAND  Result Value Ref Range Status   Specimen Description BLOOD LEFT HAND  Final   Special Requests   Final    BOTTLES DRAWN AEROBIC ONLY Blood Culture adequate volume   Culture   Final    NO GROWTH 4 DAYS Performed at [facility], [address] [place], [place] [address]    Report Status PENDING  Incomplete  Culture, blood (routine x 2)     Status: None (Preliminary result)   Collection Time: 08/16/20  4:34 PM   Specimen: BLOOD  Result Value Ref Range Status  Specimen Description BLOOD RIGHT ANTECUBITAL  Final   Special Requests AEROBIC BOTTLE ONLY Blood Culture adequate volume  Final   Culture   Final    NO GROWTH 4 DAYS Performed at [facility], [address], [place], [place] [address]    Report Status PENDING  Incomplete     Labs:    Basic Metabolic Panel: Recent Labs  Lab 08/16/20 0948 08/16/20 1611 08/17/20 0716 08/18/20 0144 08/19/20 0431 08/20/20 0354 08/21/20 0441  NA   < >  --  139 140 138 137 137  K   < >  --  3.2* 3.4* 4.2 4.3 4.0  CL   < >  --  104 106 105 104 104  CO2   < >  --  25 26 25 25 22   GLUCOSE   < >  --  94 109* 123* 111* 66*  BUN   < >  --  62* 51* 45* 40* 35*  CREATININE   < > 2.38* 2.17* 1.91* 1.81* 1.51* 1.49*  CALCIUM   < >  --  8.6* 8.7* 8.8* 8.7* 8.7*  MG  --  2.0  --   --  2.0  --   --   PHOS  --  4.0  --   --   --   --   --    < > = values in this interval not displayed.   Liver Function Tests: Recent Labs  Lab 08/17/20 0716 08/18/20 0144 08/19/20 0431 08/20/20 0354 08/21/20 0441  AST 94* 87* 71* 64* 81*  ALT 27 26 27 27  32  ALKPHOS 63 64 67 67 76  BILITOT 1.1 0.8 1.1 1.1 0.9   PROT 7.1 7.0 7.0 6.9 7.1  ALBUMIN 1.9* 1.9* 1.9* 1.9* 1.9*   CBC: Recent Labs  Lab 08/16/20 0948 08/16/20 1611 08/17/20 0716 08/18/20 0144 08/19/20 0431 08/20/20 0354 08/21/20 0441  WBC 7.3   < > 7.1 7.1 7.3 6.0 6.7  NEUTROABS 6.0  --   --   --   --   --   --   HGB 8.2*   < > 7.7* 7.9* 8.0* 8.6* 8.4*  HCT 26.1*   < > 25.6* 25.4* 26.7* 28.1* 27.9*  MCV 89.4   < > 89.8 90.4 92.4 91.8 92.4  PLT 254   < > 214 216 228 225 246   < > = values in this interval not displayed.   Cardiac Enzymes: Recent Labs  Lab 08/16/20 1150  CKTOTAL 732*   BNP: BNP (last 3 results) Recent Labs    02/11/20 1739 08/17/20 0716  BNP 1,569.0* 2,349.4*    CBG: Recent Labs  Lab 08/16/20 0942 08/17/20 0033 08/21/20 0723  GLUCAP 162* 101* 80     IMAGING STUDIES DG Chest 2 View  Result Date: 08/08/2020 CLINICAL DATA:  Shortness of breath EXAM: CHEST - 2 VIEW COMPARISON:  None. FINDINGS: Interval development of a small to moderate right pleural effusion with overlying atelectasis. Mild interstitial prominence pulmonary vascular congestion. Similar enlargement the cardiac silhouette. Single lead cardiac rhythm maintenance device. Aortic atherosclerosis. IMPRESSION: 1. Interval development of a small to moderate right pleural effusion. Overlying opacity most likely represents atelectasis. 2. Pulmonary vascular congestion and cardiomegaly. Mild interstitial prominence suggesting mild interstitial edema. Electronically Signed   By: Feliberto Harts MD   On: 08/08/2020 16:07   CT Head Wo Contrast  Result Date: 08/16/2020 CLINICAL DATA:  Delirium and fall EXAM: CT HEAD WITHOUT CONTRAST TECHNIQUE: Contiguous  axial images were obtained from the base of the skull through the vertex without intravenous contrast. COMPARISON:  None. FINDINGS: Brain: No evidence of acute infarction, hemorrhage, hydrocephalus, extra-axial collection or mass lesion/mass effect. Periventricular white matter hypodensities  consistent with sequela of chronic microvascular ischemic disease. Vascular: Vascular calcifications. Skull: Normal. Negative for fracture or focal lesion. Sinuses/Orbits: No acute finding. Other: None. IMPRESSION: 1.  No acute intracranial abnormality. Electronically Signed   By: Meda Klinefelter MD   On: 08/16/2020 11:01   DG Chest Port 1 View  Result Date: 08/16/2020 CLINICAL DATA:  Delirium EXAM: PORTABLE CHEST 1 VIEW COMPARISON:  August 08, 2020 FINDINGS: The cardiomediastinal silhouette is unchanged and enlarged in contour.Evaluation is limited secondary to patient rotation. LEFT chest AICD. Small RIGHT pleural effusion. No pneumothorax. Perihilar vascular fullness with diffuse interstitial opacities, similar comparison to prior. Persistent heterogeneous opacity of the RIGHT lung base. Visualized abdomen is unremarkable. Multilevel degenerative changes of the thoracic spine. IMPRESSION: 1. Cardiomegaly with pulmonary edema and small RIGHT pleural effusion. 2. Persistent heterogeneous opacity of the RIGHT lung base. Differential considerations include infection versus atelectasis. Electronically Signed   By: Meda Klinefelter MD   On: 08/16/2020 11:03   ECHOCARDIOGRAM COMPLETE  Result Date: 08/17/2020    ECHOCARDIOGRAM REPORT   Patient Name:   Ryan Macdonald Date of Exam: 08/17/2020 Medical Rec #:  [id number]      Height:       67.0 in Accession #:    [id number]     Weight:       194.7 lb Date of Birth:  1934-05-31      BSA:          1.999 m Patient Age:    86 years       BP:           99/58 mmHg Patient Gender: M              HR:           82 bpm. Exam Location:  Inpatient Procedure: 2D Echo, Cardiac Doppler and Color Doppler Indications:    Elevated Troponin  History:        Patient has prior history of Echocardiogram examinations, most                 recent 03/27/2020. Cardiomyopathy and CHF, Arrythmias:Atrial                 Fibrillation, Signs/Symptoms:Dyspnea; Risk Factors:Hypertension                  and Diabetes.  Sonographer:    Eulah Pont RDCS Referring Phys: [id number] Corrin Parker  Sonographer Comments: Technically difficult study due to poor echo windows. IMPRESSIONS  1. Left ventricular ejection fraction, by estimation, is 20 to 25%. The left ventricle has severely decreased function. The left ventricle demonstrates global hypokinesis. The left ventricular internal cavity size was severely dilated. Left ventricular diastolic parameters are indeterminate.  2. Right ventricular systolic function is mildly reduced. The right ventricular size is normal. There is normal pulmonary artery systolic pressure. The estimated right ventricular systolic pressure is 19.4 mmHg.  3. Left atrial size was moderately dilated.  4. The mitral valve is normal in structure. Mild mitral valve regurgitation. No evidence of mitral stenosis.  5. The aortic valve has an indeterminant number of cusps. There is severe calcifcation of the aortic valve. There is severe thickening of the aortic valve. Aortic valve regurgitation is not visualized. There is at  least Moderate aortic valve stenosis and possibly severe by calculated AVA but the mean and peak transaortic gradients are low due to low flow low output AS. SVI is low at 21. Aortic valve area, by VTI measures 1.27 cm. Aortic valve mean gradient measures 6.0 mmHg.  6. The inferior vena cava is dilated in size with >50% respiratory variability, suggesting right atrial pressure of 8 mmHg. FINDINGS  Left Ventricle: Left ventricular ejection fraction, by estimation, is 20 to 25%. The left ventricle has severely decreased function. The left ventricle demonstrates global hypokinesis. The left ventricular internal cavity size was severely dilated. There is no left ventricular hypertrophy of the basal-septal segment. Left ventricular diastolic function could not be evaluated due to atrial fibrillation. Left ventricular diastolic parameters are indeterminate. Right Ventricle:  The right ventricular size is normal. No increase in right ventricular wall thickness. Right ventricular systolic function is mildly reduced. There is normal pulmonary artery systolic pressure. The tricuspid regurgitant velocity is 1.69 m/s, and with an assumed right atrial pressure of 8 mmHg, the estimated right ventricular systolic pressure is 19.4 mmHg. Left Atrium: Left atrial size was moderately dilated. Right Atrium: Right atrial size was normal in size. Pericardium: There is no evidence of pericardial effusion. Mitral Valve: The mitral valve is normal in structure. Mild mitral valve regurgitation. No evidence of mitral valve stenosis. Tricuspid Valve: The tricuspid valve is normal in structure. Tricuspid valve regurgitation is trivial. No evidence of tricuspid stenosis. Aortic Valve: The aortic valve has an indeterminant number of cusps. There is severe calcifcation of the aortic valve. There is severe thickening of the aortic valve. Aortic valve regurgitation is not visualized. Moderate aortic stenosis is present. Aortic valve mean gradient measures 6.0 mmHg. Aortic valve peak gradient measures 12.0 mmHg. Aortic valve area, by VTI measures 1.27 cm. Pulmonic Valve: The pulmonic valve was normal in structure. Pulmonic valve regurgitation is not visualized. No evidence of pulmonic stenosis. Aorta: The aortic root is normal in size and structure. Venous: The inferior vena cava is dilated in size with greater than 50% respiratory variability, suggesting right atrial pressure of 8 mmHg. IAS/Shunts: No atrial level shunt detected by color flow Doppler. Additional Comments: A pacer wire is visualized.  LEFT VENTRICLE PLAX 2D LVIDd:         7.50 cm LVIDs:         6.60 cm LV PW:         1.00 cm LV IVS:        0.90 cm LVOT diam:     1.90 cm LV SV:         42 LV SV Index:   21 LVOT Area:     2.84 cm  LV Volumes (MOD) LV vol d, MOD A4C: 221.0 ml LV vol s, MOD A4C: 150.0 ml LV SV MOD A4C:     221.0 ml RIGHT VENTRICLE RV  S prime:     7.05 cm/s TAPSE (M-mode): 1.3 cm LEFT ATRIUM              Index       RIGHT ATRIUM           Index LA diam:        4.30 cm  2.15 cm/m  RA Area:     17.30 cm LA Vol (A2C):   103.0 ml 51.52 ml/m RA Volume:   50.00 ml  25.01 ml/m LA Vol (A4C):   74.7 ml  37.37 ml/m LA Biplane Vol: 88.9 ml  44.47  ml/m  AORTIC VALVE AV Area (Vmax):    1.48 cm AV Area (Vmean):   1.38 cm AV Area (VTI):     1.27 cm AV Vmax:           173.00 cm/s AV Vmean:          116.000 cm/s AV VTI:            0.328 m AV Peak Grad:      12.0 mmHg AV Mean Grad:      6.0 mmHg LVOT Vmax:         90.60 cm/s LVOT Vmean:        56.300 cm/s LVOT VTI:          0.147 m LVOT/AV VTI ratio: 0.45  AORTA Ao Root diam: 3.50 cm Ao Asc diam:  3.60 cm MR Peak grad: 58.7 mmHg   TRICUSPID VALVE MR Mean grad: 34.0 mmHg   TR Peak grad:   11.4 mmHg MR Vmax:      383.00 cm/s TR Vmax:        169.00 cm/s MR Vmean:     273.0 cm/s                           SHUNTS                           Systemic VTI:  0.15 m                           Systemic Diam: 1.90 cm Armanda Magic MD Electronically signed by Armanda Magic MD Signature Date/Time: 08/17/2020/4:32:52 PM    Final    DG HIP PORT UNILAT WITH PELVIS 1V LEFT  Result Date: 08/17/2020 CLINICAL DATA:  Pain in LEFT hip EXAM: DG HIP (WITH OR WITHOUT PELVIS) 1V PORT LEFT COMPARISON:  March 14, 2019 FINDINGS: Status post LEFT hip arthroplasty. Orthopedic hardware is intact and without periprosthetic fracture or lucency. There is partial bridging heterotopic ossifications along the LEFT superior implant, unchanged in comparison to prior. Osteopenia. No acute fracture or dislocation. Mild degenerative changes of the RIGHT hip. Degenerative changes of the lower lumbar spine. Transitional anatomy with a LEFT-sided assimilation joint at L5-S1. No area of erosion or osseous destruction. No unexpected radiopaque foreign body. Vascular calcifications. IMPRESSION: 1. Status post LEFT hip arthroplasty without evidence of  hardware complication. 2. No acute osseous abnormality. Electronically Signed   By: Meda Klinefelter MD   On: 08/17/2020 11:09    DISCHARGE EXAMINATION: Vitals:   08/20/20 2317 08/21/20 0332 08/21/20 0420 08/21/20 0724  BP: 113/63 111/62  104/61  Pulse: 82 77  79  Resp: (!) Temp: 98.5 F (36.9 C) 98.5 F (36.9 C)  97.8 F (36.6 C)  TempSrc: Oral Oral  Oral  SpO2: 98% 96%  97%  Weight:   91 kg   Height:       General appearance: Awake alert.  In no distress Resp: Clear to auscultation bilaterally.  Normal effort Cardio: S1-S2 is normal regular.  No S3-S4.  No rubs murmurs or bruit GI: Abdomen is soft.  Nontender nondistended.  Bowel sounds are present normal.  No masses organomegaly    DISPOSITION: SNF  Discharge Instructions    (HEART FAILURE PATIENTS) Call MD:  Anytime you have any of the following symptoms: 1) 3 pound weight gain in 24 hours  or 5 pounds in 1 week 2) shortness of breath, with or without a dry hacking cough 3) swelling in the hands, feet or stomach 4) if you have to sleep on extra pillows at night in order to breathe.   Complete by: As directed    Call MD for:  difficulty breathing, headache or visual disturbances   Complete by: As directed    Call MD for:  extreme fatigue   Complete by: As directed    Call MD for:  persistant dizziness or light-headedness   Complete by: As directed    Call MD for:  persistant nausea and vomiting   Complete by: As directed    Call MD for:  severe uncontrolled pain   Complete by: As directed    Call MD for:  temperature >100.4   Complete by: As directed    Discharge instructions   Complete by: As directed    Please review instructions on the discharge summary.  You were cared for by a hospitalist during your hospital stay. If you have any questions about your discharge medications or the care you received while you were in the hospital after you are discharged, you can call the unit and asked to speak with  the hospitalist on call if the hospitalist that took care of you is not available. Once you are discharged, your primary care physician will handle any further medical issues. Please note that NO REFILLS for any discharge medications will be authorized once you are discharged, as it is imperative that you return to your primary care physician (or establish a relationship with a primary care physician if you do not have one) for your aftercare needs so that they can reassess your need for medications and monitor your lab values. If you do not have a primary care physician, you can call [phone] for a physician referral.   Increase activity slowly   Complete by: As directed         Allergies as of 08/21/2020      Reactions   Lisinopril Swelling   Facial swelling   Xarelto [rivaroxaban] Other (See Comments)   "Blood came out of my penis"      Medication List    STOP taking these medications   hydrALAZINE 25 MG tablet Commonly known as: APRESOLINE   isosorbide dinitrate 10 MG tablet Commonly known as: ISORDIL   metolazone 2.5 MG tablet Commonly known as: ZAROXOLYN   potassium chloride SA 20 MEQ tablet Commonly known as: KLOR-CON   tamsulosin 0.4 MG Caps capsule Commonly known as: FLOMAX     TAKE these medications   allopurinol 100 MG tablet Commonly known as: ZYLOPRIM Take 200 mg by mouth daily.   amiodarone 200 MG tablet Commonly known as: PACERONE Take 1 tablet (200 mg total) by mouth daily.   amoxicillin 500 MG capsule Commonly known as: AMOXIL Take 1 capsule (500 mg total) by mouth every 8 (eight) hours for 7 days.   apixaban 2.5 MG Tabs tablet Commonly known as: ELIQUIS Take 1 tablet (2.5 mg total) by mouth 2 (two) times daily.   benzonatate 100 MG capsule Commonly known as: TESSALON Take 1 capsule (100 mg total) by mouth 3 (three) times daily as needed for cough.   cyanocobalamin 1000 MCG tablet Take 1 tablet (1,000 mcg total) by mouth daily.   diclofenac  Sodium 1 % Gel Commonly known as: VOLTAREN Apply 4 g topically 4 (four) times daily. What changed:   when to take this  reasons to take this   finasteride 5 MG tablet Commonly known as: PROSCAR Take 1 tablet (5 mg total) by mouth daily. What changed: when to take this   HYDROcodone-acetaminophen 5-325 MG tablet Commonly known as: NORCO/VICODIN Take 1 tablet by mouth 3 (three) times daily as needed for severe pain. What changed: when to take this   ipratropium-albuterol 0.5-2.5 (3) MG/3ML Soln Commonly known as: DUONEB Take 3 mLs by nebulization every 4 (four) hours as needed.   iron polysaccharides 150 MG capsule Commonly known as: NIFEREX Take 1 capsule (150 mg total) by mouth daily.   levothyroxine 125 MCG tablet Commonly known as: SYNTHROID Take 1 tablet (125 mcg total) by mouth daily before breakfast.   lidocaine 5 % Commonly known as: LIDODERM Place 1 patch onto the skin daily. Remove & Discard patch within 12 hours or as directed by MD Start taking on: August 22, 2020   magnesium oxide 400 MG tablet Commonly known as: MAG-OX Take 400 mg by mouth daily.   polyethylene glycol 17 g packet Commonly known as: MIRALAX / GLYCOLAX Take 17 g by mouth 2 (two) times daily.   rosuvastatin 10 MG tablet Commonly known as: CRESTOR Take 1 tablet (10 mg total) by mouth daily at 6 PM.   saccharomyces boulardii 250 MG capsule Commonly known as: FLORASTOR Take 1 capsule (250 mg total) by mouth 2 (two) times daily.   senna-docusate 8.6-50 MG tablet Commonly known as: Senokot-S Take 1 tablet by mouth 2 (two) times daily.   sertraline 25 MG tablet Commonly known as: ZOLOFT Take 25 mg by mouth daily.   spironolactone 25 MG tablet Commonly known as: ALDACTONE Take 12.5 mg by mouth at bedtime.   torsemide 20 MG tablet Commonly known as: DEMADEX Take 4 tablets (80 mg total) by mouth 2 (two) times daily. What changed: See the new instructions.         Contact  information for follow-up providers    [facility] Follow up on 09/22/2020.   Specialty: Cardiology Why: 1:30 PM [facility]  Contact information: [address] [id number] Wilhemina Bonito [place] [address] [phone]           Contact information for after-discharge care    Destination    [facility] .   Service: Skilled Nursing Contact information: [address] [place] [address] [phone]                  TOTAL DISCHARGE TIME: 35 minutes  Riana Tessmer Rito Ehrlich  [facility] Pager on [email or web address]  08/21/2020, 11:00 AM

## 2020-08-21 NOTE — TOC Transition Note (Signed)
Transition of Care [facility]) - CM/SW Discharge Note   Patient Details  Name: Jaydeen Odor MRN: [id number] Date of Birth: [birth year]  Transition of Care [facility]) CM/SW Contact:  Baldemar Lenis, LCSW Phone Number: 08/21/2020, 1:12 PM   Clinical Narrative:   Nurse to call report to [phone], Room 105    Final next level of care: Skilled Nursing Facility Barriers to Discharge: Barriers Resolved   Patient Goals and CMS Choice Patient states their goals for this hospitalization and ongoing recovery are:: Pt an wife agreeable to SNF. CMS Medicare.gov Compare Post Acute Care list provided to:: Patient Represenative (must comment) (Spouse) Choice offered to / list presented to : Spouse  Discharge Placement              Patient chooses bed at: [facility] Patient to be transferred to facility by: [facility] Name of family member notified: [name] Patient and family notified of of transfer: 08/21/20  Discharge Plan and Services                                     Social Determinants of Health (SDOH) Interventions     Readmission Risk Interventions No flowsheet data found.

## 2020-09-03 ENCOUNTER — Encounter (HOSPITAL_COMMUNITY): Payer: No Typology Code available for payment source

## 2020-09-08 ENCOUNTER — Telehealth: Payer: Self-pay

## 2020-09-08 NOTE — Telephone Encounter (Signed)
[employer] alert received 09/07/20 for home remote monitor not connected.   Spoke to patients wife Ryan Macdonald, [place]), states patient is at [facility]. States home monitor is at their house but she will take it today. Advised her it needs to be placed next to where patient sleeps within 4 feet. Verbalized understanding.

## 2020-09-19 ENCOUNTER — Ambulatory Visit (INDEPENDENT_AMBULATORY_CARE_PROVIDER_SITE_OTHER): Payer: No Typology Code available for payment source

## 2020-09-19 DIAGNOSIS — I428 Other cardiomyopathies: Secondary | ICD-10-CM

## 2020-09-19 DIAGNOSIS — I5022 Chronic systolic (congestive) heart failure: Secondary | ICD-10-CM

## 2020-09-20 ENCOUNTER — Emergency Department (HOSPITAL_COMMUNITY): Payer: No Typology Code available for payment source

## 2020-09-20 ENCOUNTER — Inpatient Hospital Stay (HOSPITAL_COMMUNITY)
Admission: EM | Admit: 2020-09-20 | Discharge: 2020-09-25 | DRG: 690 | Disposition: A | Discharge status: Skilled Nursing Facility | Payer: No Typology Code available for payment source | Source: Skilled Nursing Facility | Attending: Internal Medicine | Admitting: Internal Medicine

## 2020-09-20 ENCOUNTER — Encounter (HOSPITAL_COMMUNITY): Payer: Self-pay | Admitting: Internal Medicine

## 2020-09-20 ENCOUNTER — Inpatient Hospital Stay (HOSPITAL_COMMUNITY): Payer: No Typology Code available for payment source

## 2020-09-20 ENCOUNTER — Other Ambulatory Visit: Payer: Self-pay

## 2020-09-20 DIAGNOSIS — E7800 Pure hypercholesterolemia, unspecified: Secondary | ICD-10-CM | POA: Diagnosis present

## 2020-09-20 DIAGNOSIS — R54 Age-related physical debility: Secondary | ICD-10-CM | POA: Diagnosis present

## 2020-09-20 DIAGNOSIS — M1990 Unspecified osteoarthritis, unspecified site: Secondary | ICD-10-CM | POA: Diagnosis present

## 2020-09-20 DIAGNOSIS — N183 Chronic kidney disease, stage 3 unspecified: Secondary | ICD-10-CM | POA: Diagnosis present

## 2020-09-20 DIAGNOSIS — Z8249 Family history of ischemic heart disease and other diseases of the circulatory system: Secondary | ICD-10-CM

## 2020-09-20 DIAGNOSIS — E785 Hyperlipidemia, unspecified: Secondary | ICD-10-CM | POA: Diagnosis present

## 2020-09-20 DIAGNOSIS — I130 Hypertensive heart and chronic kidney disease with heart failure and stage 1 through stage 4 chronic kidney disease, or unspecified chronic kidney disease: Secondary | ICD-10-CM | POA: Diagnosis present

## 2020-09-20 DIAGNOSIS — L89629 Pressure ulcer of left heel, unspecified stage: Secondary | ICD-10-CM | POA: Diagnosis present

## 2020-09-20 DIAGNOSIS — E43 Unspecified severe protein-calorie malnutrition: Secondary | ICD-10-CM | POA: Insufficient documentation

## 2020-09-20 DIAGNOSIS — D638 Anemia in other chronic diseases classified elsewhere: Secondary | ICD-10-CM | POA: Diagnosis present

## 2020-09-20 DIAGNOSIS — I509 Heart failure, unspecified: Secondary | ICD-10-CM

## 2020-09-20 DIAGNOSIS — I428 Other cardiomyopathies: Secondary | ICD-10-CM | POA: Diagnosis present

## 2020-09-20 DIAGNOSIS — I5022 Chronic systolic (congestive) heart failure: Secondary | ICD-10-CM | POA: Diagnosis present

## 2020-09-20 DIAGNOSIS — Z20822 Contact with and (suspected) exposure to covid-19: Secondary | ICD-10-CM | POA: Diagnosis present

## 2020-09-20 DIAGNOSIS — D631 Anemia in chronic kidney disease: Secondary | ICD-10-CM | POA: Diagnosis present

## 2020-09-20 DIAGNOSIS — I10 Essential (primary) hypertension: Secondary | ICD-10-CM | POA: Diagnosis not present

## 2020-09-20 DIAGNOSIS — E440 Moderate protein-calorie malnutrition: Secondary | ICD-10-CM | POA: Diagnosis present

## 2020-09-20 DIAGNOSIS — N390 Urinary tract infection, site not specified: Principal | ICD-10-CM | POA: Diagnosis present

## 2020-09-20 DIAGNOSIS — Z79899 Other long term (current) drug therapy: Secondary | ICD-10-CM

## 2020-09-20 DIAGNOSIS — Z888 Allergy status to other drugs, medicaments and biological substances status: Secondary | ICD-10-CM

## 2020-09-20 DIAGNOSIS — Z6826 Body mass index (BMI) 26.0-26.9, adult: Secondary | ICD-10-CM

## 2020-09-20 DIAGNOSIS — R319 Hematuria, unspecified: Secondary | ICD-10-CM

## 2020-09-20 DIAGNOSIS — Z96642 Presence of left artificial hip joint: Secondary | ICD-10-CM | POA: Diagnosis present

## 2020-09-20 DIAGNOSIS — Z95810 Presence of automatic (implantable) cardiac defibrillator: Secondary | ICD-10-CM

## 2020-09-20 DIAGNOSIS — E1122 Type 2 diabetes mellitus with diabetic chronic kidney disease: Secondary | ICD-10-CM | POA: Diagnosis present

## 2020-09-20 DIAGNOSIS — Z955 Presence of coronary angioplasty implant and graft: Secondary | ICD-10-CM | POA: Diagnosis not present

## 2020-09-20 DIAGNOSIS — N1832 Chronic kidney disease, stage 3b: Secondary | ICD-10-CM | POA: Diagnosis present

## 2020-09-20 DIAGNOSIS — Z86711 Personal history of pulmonary embolism: Secondary | ICD-10-CM

## 2020-09-20 DIAGNOSIS — E039 Hypothyroidism, unspecified: Secondary | ICD-10-CM | POA: Diagnosis present

## 2020-09-20 DIAGNOSIS — Z7901 Long term (current) use of anticoagulants: Secondary | ICD-10-CM

## 2020-09-20 DIAGNOSIS — Z79890 Hormone replacement therapy (postmenopausal): Secondary | ICD-10-CM

## 2020-09-20 DIAGNOSIS — M109 Gout, unspecified: Secondary | ICD-10-CM | POA: Diagnosis present

## 2020-09-20 DIAGNOSIS — Z87891 Personal history of nicotine dependence: Secondary | ICD-10-CM

## 2020-09-20 DIAGNOSIS — I480 Paroxysmal atrial fibrillation: Secondary | ICD-10-CM | POA: Diagnosis present

## 2020-09-20 DIAGNOSIS — N179 Acute kidney failure, unspecified: Secondary | ICD-10-CM | POA: Diagnosis not present

## 2020-09-20 HISTORY — DX: Acute respiratory failure, unspecified whether with hypoxia or hypercapnia: J96.00

## 2020-09-20 LAB — CUP PACEART REMOTE DEVICE CHECK
Date Time Interrogation Session: 20211022072755
Implantable Lead Implant Date: 20161221
Implantable Lead Location: 753860
Implantable Lead Model: 365
Implantable Lead Serial Number: 10600411
Implantable Pulse Generator Implant Date: 20161221
Pulse Gen Model: 399436
Pulse Gen Serial Number: 60886010

## 2020-09-20 LAB — CBC WITH DIFFERENTIAL/PLATELET
Abs Immature Granulocytes: 0 10*3/uL (ref 0.00–0.07)
Basophils Absolute: 0 10*3/uL (ref 0.0–0.1)
Basophils Relative: 0 %
Eosinophils Absolute: 0 10*3/uL (ref 0.0–0.5)
Eosinophils Relative: 0 %
HCT: 29.9 % — ABNORMAL LOW (ref 39.0–52.0)
Hemoglobin: 9.4 g/dL — ABNORMAL LOW (ref 13.0–17.0)
Lymphocytes Relative: 12 %
Lymphs Abs: 0.8 10*3/uL (ref 0.7–4.0)
MCH: 30.9 pg (ref 26.0–34.0)
MCHC: 31.4 g/dL (ref 30.0–36.0)
MCV: 98.4 fL (ref 80.0–100.0)
Monocytes Absolute: 0.6 10*3/uL (ref 0.1–1.0)
Monocytes Relative: 9 %
Neutro Abs: 5.2 10*3/uL (ref 1.7–7.7)
Neutrophils Relative %: 79 %
Platelets: 229 10*3/uL (ref 150–400)
RBC: 3.04 MIL/uL — ABNORMAL LOW (ref 4.22–5.81)
RDW: 24.3 % — ABNORMAL HIGH (ref 11.5–15.5)
WBC: 6.6 10*3/uL (ref 4.0–10.5)
nRBC: 0 % (ref 0.0–0.2)
nRBC: 0 /100 WBC

## 2020-09-20 LAB — COMPREHENSIVE METABOLIC PANEL
ALT: 22 U/L (ref 0–44)
AST: 55 U/L — ABNORMAL HIGH (ref 15–41)
Albumin: 2.1 g/dL — ABNORMAL LOW (ref 3.5–5.0)
Alkaline Phosphatase: 85 U/L (ref 38–126)
Anion gap: 12 (ref 5–15)
BUN: 55 mg/dL — ABNORMAL HIGH (ref 8–23)
CO2: 23 mmol/L (ref 22–32)
Calcium: 8.9 mg/dL (ref 8.9–10.3)
Chloride: 103 mmol/L (ref 98–111)
Creatinine, Ser: 2.98 mg/dL — ABNORMAL HIGH (ref 0.61–1.24)
GFR, Estimated: 20 mL/min — ABNORMAL LOW (ref >60)
Glucose, Bld: 89 mg/dL (ref 70–99)
Potassium: 3.9 mmol/L (ref 3.5–5.1)
Sodium: 138 mmol/L (ref 135–145)
Total Bilirubin: 1.4 mg/dL — ABNORMAL HIGH (ref 0.3–1.2)
Total Protein: 7.3 g/dL (ref 6.5–8.1)

## 2020-09-20 LAB — URINALYSIS, ROUTINE W REFLEX MICROSCOPIC
Bilirubin Urine: NEGATIVE
Glucose, UA: NEGATIVE mg/dL
Ketones, ur: NEGATIVE mg/dL
Nitrite: NEGATIVE
Protein, ur: 100 mg/dL — AB
RBC / HPF: >50 RBC/hpf — ABNORMAL HIGH (ref 0–5)
Specific Gravity, Urine: 1.011 (ref 1.005–1.030)
WBC, UA: >50 WBC/hpf — ABNORMAL HIGH (ref 0–5)
pH: 5 (ref 5.0–8.0)

## 2020-09-20 LAB — CBG MONITORING, ED: Glucose-Capillary: 87 mg/dL (ref 70–99)

## 2020-09-20 LAB — LACTIC ACID, PLASMA
Lactic Acid, Venous: 1.8 mmol/L (ref 0.5–1.9)
Lactic Acid, Venous: 1.7 mmol/L (ref 0.5–1.9)

## 2020-09-20 LAB — RESPIRATORY PANEL BY RT PCR (FLU A&B, COVID)
Influenza A by PCR: NEGATIVE
Influenza B by PCR: NEGATIVE
SARS Coronavirus 2 by RT PCR: NEGATIVE

## 2020-09-20 MED ORDER — LEVOTHYROXINE SODIUM 25 MCG PO TABS
125.0000 ug | ORAL_TABLET | Freq: Every day | ORAL | Status: DC
  Administered 2020-09-21 – 2020-09-25 (×5): 125 ug via ORAL
  Filled 2020-09-20 (×4): qty 1
  Filled 2020-09-20: qty 2

## 2020-09-20 MED ORDER — ROSUVASTATIN CALCIUM 5 MG PO TABS
10.0000 mg | ORAL_TABLET | Freq: Every day | ORAL | Status: DC
  Administered 2020-09-21 – 2020-09-25 (×5): 10 mg via ORAL
  Filled 2020-09-20 (×4): qty 2

## 2020-09-20 MED ORDER — IPRATROPIUM-ALBUTEROL 0.5-2.5 (3) MG/3ML IN SOLN: 3.0000 mL | RESPIRATORY_TRACT | Status: DC | PRN

## 2020-09-20 MED ORDER — AMIODARONE HCL 200 MG PO TABS
200.0000 mg | ORAL_TABLET | Freq: Every day | ORAL | Status: DC
  Administered 2020-09-22 – 2020-09-25 (×4): 200 mg via ORAL
  Filled 2020-09-20 (×4): qty 1

## 2020-09-20 MED ORDER — FINASTERIDE 5 MG PO TABS
5.0000 mg | ORAL_TABLET | Freq: Every day | ORAL | Status: DC
  Administered 2020-09-22 – 2020-09-25 (×4): 5 mg via ORAL
  Filled 2020-09-20 (×4): qty 1

## 2020-09-20 MED ORDER — POLYETHYLENE GLYCOL 3350 17 G PO PACK
17.0000 g | PACK | Freq: Two times a day (BID) | ORAL | Status: DC
  Administered 2020-09-20 – 2020-09-22 (×3): 17 g via ORAL
  Filled 2020-09-20 (×4): qty 1

## 2020-09-20 MED ORDER — ACETAMINOPHEN 650 MG RE SUPP: 650.0000 mg | Freq: Four times a day (QID) | RECTAL | Status: DC | PRN

## 2020-09-20 MED ORDER — SODIUM CHLORIDE 0.9 % IV SOLN
1.0000 g | Freq: Once | INTRAVENOUS | Status: AC
  Administered 2020-09-20: 1 g via INTRAVENOUS
  Filled 2020-09-20: qty 10

## 2020-09-20 MED ORDER — MAGNESIUM OXIDE 400 (241.3 MG) MG PO TABS
400.0000 mg | ORAL_TABLET | Freq: Every day | ORAL | Status: DC
  Administered 2020-09-22 – 2020-09-25 (×4): 400 mg via ORAL
  Filled 2020-09-20 (×4): qty 1

## 2020-09-20 MED ORDER — SODIUM CHLORIDE 0.9 % IV BOLUS
250.0000 mL | Freq: Once | INTRAVENOUS | Status: AC
  Administered 2020-09-20: 250 mL via INTRAVENOUS

## 2020-09-20 MED ORDER — ACETAMINOPHEN 325 MG PO TABS
650.0000 mg | ORAL_TABLET | Freq: Four times a day (QID) | ORAL | Status: DC | PRN
  Administered 2020-09-20 – 2020-09-24 (×3): 650 mg via ORAL
  Filled 2020-09-20 (×4): qty 2

## 2020-09-20 MED ORDER — SODIUM CHLORIDE 0.9 % IV SOLN
1.0000 g | INTRAVENOUS | Status: AC
  Administered 2020-09-21 – 2020-09-24 (×4): 1 g via INTRAVENOUS
  Filled 2020-09-20 (×4): qty 10

## 2020-09-20 MED ORDER — APIXABAN 2.5 MG PO TABS
2.5000 mg | ORAL_TABLET | Freq: Two times a day (BID) | ORAL | Status: DC
  Administered 2020-09-20 – 2020-09-25 (×10): 2.5 mg via ORAL
  Filled 2020-09-20 (×11): qty 1

## 2020-09-20 NOTE — ED Notes (Signed)
Pt admitted to 3E27; report called to Edmund, Charity fundraiser.

## 2020-09-20 NOTE — ED Triage Notes (Signed)
EMS stated, Alter mental Status, coming from [facility]. Larey Seat a month ago, and is there for Re-hab. Had a UTI and antibiotics with no improvement. Takes cipro, and its his 3rd round of antibiotics. He has had AMS for a month off and on and not any better. IV 20 g in left forearm. Given 200cc of NS

## 2020-09-20 NOTE — ED Notes (Signed)
Patient transported to X-ray 

## 2020-09-20 NOTE — ED Provider Notes (Signed)
[name] EMERGENCY DEPARTMENT Provider Note   CSN: [id number] Arrival date & time: 09/20/20  1430     History No chief complaint on file.   Laurence Crofford is a 86 y.o. male past medical history of AKI, cardiomyopathy, CHF, diabetes, UTI, hypertension who presents for evaluation of altered mental status, concern for not eating, dark urine.  Wife is at bedside who provides much of the history.  She states that patient has been at [facility] since last month after a fall and sepsis from UTI.  She states that he had been on antibiotics.  She states that he recently completed Cipro.  She states that she started noticing that his urine was dark again which it was when he had a UTI.  Additionally, she had noticed that he had stopped eating and seemed more fatigued and tired.  I discussed with the nurse at [facility] as well who states about a week ago, she noticed that he seemed more lethargic and confused.  She is only having some degree of altered mental status but seemed a little bit more confused over the last week.  She states she had noticed herself that he had not been eating.  He had not had any vomiting but had just felt like not eating.  She actually did an IV last night to give him some fluids and.  She had not noted any fever at home.  EM LEVEL 5 CAVEAT DUE TO AMS  The history is provided by the patient.       Past Medical History:  Diagnosis Date  . AKI (acute kidney injury) (HCC) 02/2019  . Atrial fibrillation (HCC)   . Cardiomyopathy (HCC)   . Cat scratch fever    removed mass on right side neck  . Cataracts, bilateral   . CHF (congestive heart failure) (HCC)   . Chronic kidney disease   . Corns and callosities   . Diabetes mellitus without complication (HCC)    TYPE 2  . Dyspnea   . Erythema intertrigo   . Flat foot   . Gout   . High cholesterol   . Hx of urethral stricture   . Hypertension   . Kidney stones   . Morbid obesity (HCC)     . Nonischemic cardiomyopathy (HCC)    a. ? 2009 Cath in [place] - nl cors per pt;  b. 09/2013 Echo: EF 25-30%, sev diff HK.  . Obesity   . Osteoarthritis   . PAF (paroxysmal atrial fibrillation) (HCC)    a. post-op hip in 2014 - prev on xarelto.  . Renal insufficiency     Patient Active Problem List   Diagnosis Date Noted  . Sepsis (HCC) 08/16/2020  . Hematuria 02/15/2020  . CKD (chronic kidney disease), stage III (HCC) 02/15/2020  . Constipation 02/13/2020  . Anemia of chronic disease 02/13/2020  . Secondary hypercoagulable state (HCC) 01/16/2020  . AKI (acute kidney injury) (HCC) 03/14/2019  . Spasm of muscle of lower back 03/14/2019  . Acute renal failure (ARF) (HCC) 03/09/2019  . Transaminitis   . Acute on chronic kidney failure (HCC) 03/08/2019  . Acute exacerbation of CHF (congestive heart failure) (HCC) 11/23/2018  . Acute on chronic heart failure (HCC) 10/19/2018  . Pulmonary emphysema (HCC)   . Chronic respiratory failure with hypoxia (HCC)   . Morbid obesity (HCC) 09/09/2018  . URI (upper respiratory infection) 08/31/2018  . UTI (urinary tract infection) 08/31/2018  . Chronic systolic CHF (congestive heart  failure) (HCC) 08/28/2018  . CHF (congestive heart failure) (HCC) 08/14/2018  . Acute on chronic combined systolic and diastolic CHF (congestive heart failure) (HCC) 07/18/2018  . First degree AV block   . Shortness of breath 04/29/2018  . Acute HFrEF (heart failure with reduced ejection fraction) (HCC) 04/29/2018  . Acute respiratory failure with hypoxia (HCC) 09/27/2017  . CHF exacerbation (HCC) 09/27/2017  . Renal insufficiency   . Acute on chronic systolic CHF (congestive heart failure) (HCC) 07/08/2017  . Left knee pain 07/08/2017  . Acute bronchitis 05/10/2017  . Acute congestive heart failure (HCC) 04/14/2017  . Elevated troponin 04/14/2017  . Lactic acidosis   . Acute on chronic systolic heart failure, NYHA class 2 (HCC) 02/03/2017  . Diabetes mellitus  type 2 in obese (HCC) 02/03/2017  . CAP (community acquired pneumonia) 02/03/2017  . Lobar pneumonia (HCC)   . Diabetes mellitus with complication (HCC)   . Acute on chronic respiratory failure with hypoxia (HCC)   . Acute on chronic combined systolic and diastolic heart failure (HCC) 05/19/2016  . Depression 05/19/2016  . Gout 05/19/2016  . 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 NSVT (nonsustained ventricular tachycardia) (HCC)   . Chronic renal insufficiency, stage 3 (moderate) (HCC)   . Hyperkalemia 08/21/2015  . Pulmonary embolism (HCC)   . Acute respiratory failure (HCC)   . Osteoarthritis of left hip 10/25/2013  . PAF (paroxysmal atrial fibrillation) (HCC) 10/25/2013  . S/P hip replacement 10/24/2013  . Nonischemic cardiomyopathy (HCC)   . Obesity (BMI 30-39.9)   . Essential hypertension   . Hyperlipidemia     Past Surgical History:  Procedure Laterality Date  . CARDIAC CATHETERIZATION  1980's  . CARDIOVERSION N/A 01/15/2020   Procedure: CARDIOVERSION;  Surgeon: Laurey Morale, MD;  Location: [facility] ENDOSCOPY;  Service: Cardiovascular;  Laterality: N/A;  . CIRCUMCISION  1940's  . CORONARY STENT INTERVENTION N/A 08/18/2018   Procedure: CORONARY STENT INTERVENTION;  Surgeon: Corky Crafts, MD;  Location: [facility] INVASIVE CV LAB;  Service: Cardiovascular;  Laterality: N/A;  . CYSTOSCOPY WITH URETHRAL DILATATION  10/23/2013  . CYSTOSCOPY WITH URETHRAL DILATATION N/A 10/23/2013   Procedure: CYSTOSCOPY WITH URETHRAL DILATATION;  Surgeon: Kathi Ludwig, MD;  Location: [facility] OR;  Service: Urology;  Laterality: N/A;  . INCISION AND DRAINAGE OF WOUND Right 1980's   "cat scratch" (10/23/2013)  . INGUINAL HERNIA REPAIR Right 1950's  . INTRAVASCULAR ULTRASOUND/IVUS N/A 08/18/2018   Procedure: Intravascular Ultrasound/IVUS;  Surgeon: Corky Crafts, MD;  Location: [facility] INVASIVE CV LAB;  Service: Cardiovascular;  Laterality: N/A;  . KIDNEY STONE SURGERY  Right 1970's; 1983   "twice"  . LEFT HEART CATH AND CORONARY ANGIOGRAPHY N/A 08/18/2018   Procedure: LEFT HEART CATH AND CORONARY ANGIOGRAPHY;  Surgeon: Corky Crafts, MD;  Location: [facility] INVASIVE CV LAB;  Service: Cardiovascular;  Laterality: N/A;  . RIGHT HEART CATH N/A 02/19/2020   Procedure: RIGHT HEART CATH;  Surgeon: Laurey Morale, MD;  Location: [facility] INVASIVE CV LAB;  Service: Cardiovascular;  Laterality: N/A;  . TEE WITHOUT CARDIOVERSION N/A 01/15/2020   Procedure: TRANSESOPHAGEAL ECHOCARDIOGRAM (TEE);  Surgeon: Laurey Morale, MD;  Location: [facility] ENDOSCOPY;  Service: Cardiovascular;  Laterality: N/A;  . TONSILLECTOMY AND ADENOIDECTOMY  1940's  . TOTAL HIP ARTHROPLASTY Left 10/23/2013   Procedure: TOTAL HIP ARTHROPLASTY;  Surgeon: Valeria Batman, MD;  Location: [facility] OR;  Service: Orthopedics;  Laterality: Left;       Family History  Problem Relation Age of Onset  .  Heart disease Mother   . Heart Problems Maternal Grandmother   . Other Other        negative for premature CAD    Social History   Tobacco Use  . Smoking status: Former Smoker    Packs/day: 1.00    Years: 15.00    Pack years: 15.00    Types: Cigarettes, Cigars  . Smokeless tobacco: Former Neurosurgeon  . Tobacco comment: quit smoking ~ 50 yr ago  Vaping Use  . Vaping Use: Never used  Substance Use Topics  . Alcohol use: No    Alcohol/week: 2.0 standard drinks    Types: 2 Cans of beer per week    Comment: rare beer.  . Drug use: No    Home Medications Prior to Admission medications   Medication Sig Start Date End Date Taking? Authorizing Provider  allopurinol (ZYLOPRIM) 100 MG tablet Take 200 mg by mouth daily.    Yes [provider]  amiodarone (PACERONE) 200 MG tablet Take 1 tablet (200 mg total) by mouth daily. 05/29/20  Yes Laurey Morale, MD  apixaban (ELIQUIS) 2.5 MG TABS tablet Take 1 tablet (2.5 mg total) by mouth 2 (two) times daily. 03/27/20  Yes Laurey Morale, MD  benzonatate  (TESSALON) 100 MG capsule Take 1 capsule (100 mg total) by mouth 3 (three) times daily as needed for cough. 08/21/20  Yes Osvaldo Shipper, MD  cyanocobalamin 1000 MCG tablet Take 1 tablet (1,000 mcg total) by mouth daily. 03/13/19  Yes Laurey Morale, MD  diclofenac Sodium (VOLTAREN) 1 % GEL Apply 4 g topically 4 (four) times daily. Patient taking differently: Apply 4 g topically 2 (two) times daily as needed.  02/24/20  Yes Sheikh, Omair Latif, DO  finasteride (PROSCAR) 5 MG tablet Take 1 tablet (5 mg total) by mouth daily. Patient taking differently: Take 5 mg by mouth in the morning and at bedtime.  11/29/18  Yes Alford Highland, NP  HYDROcodone-acetaminophen (NORCO/VICODIN) 5-325 MG tablet Take 1 tablet by mouth 3 (three) times daily as needed for severe pain. 08/21/20  Yes Osvaldo Shipper, MD  ipratropium-albuterol (DUONEB) 0.5-2.5 (3) MG/3ML SOLN Take 3 mLs by nebulization every 4 (four) hours as needed. 05/14/17  Yes Pearson Grippe, MD  iron polysaccharides (NIFEREX) 150 MG capsule Take 1 capsule (150 mg total) by mouth daily. 08/21/20  Yes Osvaldo Shipper, MD  levothyroxine (SYNTHROID) 125 MCG tablet Take 1 tablet (125 mcg total) by mouth daily before breakfast. 08/01/19  Yes Carlus Pavlov, MD  lidocaine (LIDODERM) 5 % Place 1 patch onto the skin daily. Remove & Discard patch within 12 hours or as directed by MD 08/22/20  Yes Osvaldo Shipper, MD  magnesium oxide (MAG-OX) 400 MG tablet Take 400 mg by mouth daily.    Yes [provider]  metolazone (ZAROXOLYN) 2.5 MG tablet Take 2.5 mg by mouth daily. Every wednesday   Yes [provider]  polyethylene glycol (MIRALAX / GLYCOLAX) 17 g packet Take 17 g by mouth 2 (two) times daily. 02/24/20  Yes Sheikh, Omair Latif, DO  rosuvastatin (CRESTOR) 10 MG tablet Take 1 tablet (10 mg total) by mouth daily at 6 PM. 12/22/18  Yes [name], MD  saccharomyces boulardii (FLORASTOR) 250 MG capsule Take 1 capsule (250 mg total) by mouth 2 (two)  times daily. 08/21/20  Yes Osvaldo Shipper, MD  senna-docusate (SENOKOT-S) 8.6-50 MG tablet Take 1 tablet by mouth 2 (two) times daily. 02/24/20  Yes Sheikh, Omair Latif, DO  sertraline (ZOLOFT)  25 MG tablet Take 25 mg by mouth daily.    Yes [provider]  spironolactone (ALDACTONE) 25 MG tablet Take 12.5 mg by mouth at bedtime.   Yes [provider]  torsemide (DEMADEX) 20 MG tablet Take 4 tablets (80 mg total) by mouth 2 (two) times daily. 08/21/20  Yes Osvaldo Shipper, MD    Allergies    Lisinopril and Xarelto [rivaroxaban]  Review of Systems   Review of Systems  Unable to perform ROS: Mental status change    Physical Exam Updated Vital Signs BP (!) 105/55   Pulse 64   Temp 97.9 F (36.6 C) (Oral)   Resp 19   SpO2 97%   Physical Exam Vitals and nursing note reviewed.  Constitutional:      Appearance: Normal appearance. He is well-developed.     Comments: Frail and elderly appearing  HENT:     Head: Normocephalic and atraumatic.  Eyes:     General: Lids are normal.     Conjunctiva/sclera: Conjunctivae normal.     Pupils: Pupils are equal, round, and reactive to light.     Comments: PERRL. EOMs intact. No nystagmus. No neglect.   Cardiovascular:     Rate and Rhythm: Normal rate and regular rhythm.     Pulses: Normal pulses.          Radial pulses are 2+ on the right side and 2+ on the left side.     Heart sounds: Normal heart sounds. No murmur heard.  No friction rub. No gallop.   Pulmonary:     Effort: Pulmonary effort is normal.     Breath sounds: Normal breath sounds.     Comments: Lungs clear to auscultation bilaterally.  Symmetric chest rise.  No wheezing, rales, rhonchi. Abdominal:     Palpations: Abdomen is soft. Abdomen is not rigid.     Tenderness: There is no abdominal tenderness. There is no guarding.     Comments: Abdomen is soft, non-distended, non-tender. No rigidity, No guarding. No peritoneal signs.  Musculoskeletal:        General:  Normal range of motion.     Cervical back: Full passive range of motion without pain.  Skin:    General: Skin is warm and dry.     Capillary Refill: Capillary refill takes less than 2 seconds.  Neurological:     Mental Status: He is alert.     Comments: Alert and oriented x2.  He can tell me his name and where he is at but cannot tell me what year it is.  He keeps saying that he "fell and that he is fine and that he just wants to go home." Cranial nerves III-XII intact Follows commands, Moves all extremities  5/5 strength to BUE and BLE  Sensation intact throughout all major nerve distributions No slurred speech. No facial droop.   Psychiatric:        Speech: Speech normal.     ED Results / Procedures / Treatments   Labs (all labs ordered are listed, but only abnormal results are displayed) Labs Reviewed  COMPREHENSIVE METABOLIC PANEL - Abnormal; Notable for the following components:      Result Value   BUN 55 (*)    Creatinine, Ser 2.98 (*)    Albumin 2.1 (*)    AST 55 (*)    Total Bilirubin 1.4 (*)    GFR, Estimated 20 (*)    All other components within normal limits  CBC WITH DIFFERENTIAL/PLATELET - Abnormal;  Notable for the following components:   RBC 3.04 (*)    Hemoglobin 9.4 (*)    HCT 29.9 (*)    RDW 24.3 (*)    All other components within normal limits  URINALYSIS, ROUTINE W REFLEX MICROSCOPIC - Abnormal; Notable for the following components:   Color, Urine AMBER (*)    APPearance CLOUDY (*)    Hgb urine dipstick MODERATE (*)    Protein, ur 100 (*)    Leukocytes,Ua LARGE (*)    RBC / HPF >50 (*)    WBC, UA >50 (*)    Bacteria, UA RARE (*)    All other components within normal limits  URINE CULTURE  RESPIRATORY PANEL BY RT PCR (FLU A&B, COVID)  LACTIC ACID, PLASMA  LACTIC ACID, PLASMA  BASIC METABOLIC PANEL  CBC  CBG MONITORING, ED    EKG None  Radiology DG Chest 2 View  Result Date: 09/20/2020 CLINICAL DATA:  Altered mental status EXAM: CHEST -  2 VIEW COMPARISON:  08/16/2020 FINDINGS: LEFT-sided pacemaker overlies normal cardiac silhouette. Small RIGHT effusion. No pulmonary edema. No infiltrate. No pneumothorax. IMPRESSION: Small RIGHT effusion.  No acute cardiopulmonary findings otherwise. Electronically Signed   By: Genevive Bi M.D.   On: 09/20/2020 15:41   CT Head Wo Contrast  Result Date: 09/20/2020 CLINICAL DATA:  Altered mental status for a month. EXAM: CT HEAD WITHOUT CONTRAST TECHNIQUE: Contiguous axial images were obtained from the base of the skull through the vertex without intravenous contrast. COMPARISON:  08/16/2020 FINDINGS: Brain: No evidence of acute infarction, hemorrhage, hydrocephalus, extra-axial collection or mass lesion/mass effect. Diffuse cerebral atrophy. Ventricular dilatation is consistent with central atrophy. Patchy low-attenuation changes in the deep white matter consistent with small vessel ischemia. Vascular: Moderate intracranial arterial vascular calcifications. Skull: Calvarium appears intact. Sinuses/Orbits: Paranasal sinuses and mastoid air cells are clear. Other: No significant change since previous study. IMPRESSION: 1. No acute intracranial abnormalities. 2. Chronic atrophy and small vessel ischemia. Electronically Signed   By: Burman Nieves M.D.   On: 09/20/2020 19:27   CUP PACEART REMOTE DEVICE CHECK  Result Date: 09/20/2020 Scheduled remote reviewed. Normal device function.  Next remote 91 days.   Procedures Procedures (including critical care time)  Medications Ordered in ED Medications  cefTRIAXone (ROCEPHIN) 1 g in sodium chloride 0.9 % 100 mL IVPB (has no administration in time range)  amiodarone (PACERONE) tablet 200 mg (has no administration in time range)  rosuvastatin (CRESTOR) tablet 10 mg (has no administration in time range)  levothyroxine (SYNTHROID) tablet 125 mcg (has no administration in time range)  magnesium oxide (MAG-OX) tablet 400 mg (has no administration in  time range)  polyethylene glycol (MIRALAX / GLYCOLAX) packet 17 g (has no administration in time range)  finasteride (PROSCAR) tablet 5 mg (has no administration in time range)  apixaban (ELIQUIS) tablet 2.5 mg (has no administration in time range)  ipratropium-albuterol (DUONEB) 0.5-2.5 (3) MG/3ML nebulizer solution 3 mL (has no administration in time range)  acetaminophen (TYLENOL) tablet 650 mg (has no administration in time range)    Or  acetaminophen (TYLENOL) suppository 650 mg (has no administration in time range)  sodium chloride 0.9 % bolus 250 mL (0 mLs Intravenous Stopped 09/20/20 2051)  cefTRIAXone (ROCEPHIN) 1 g in sodium chloride 0.9 % 100 mL IVPB (0 g Intravenous Stopped 09/20/20 2050)    ED Course  I have reviewed the triage vital signs and the nursing notes.  Pertinent labs & imaging results that were available during my  care of the patient were reviewed by me and considered in my medical decision making (see chart for details).    MDM Rules/Calculators/A&P                          86 year old male who presents for evaluation of continued altered mental status, dark urine.  Patient states that he is fine and he wants to go home.  He is alert and oriented x2 but cannot tell me what year it is.  On initial arrival, he is afebrile, elderly appearing.  No acute distress.  His blood pressure is slightly soft but I reviewed his records and it looks like he has lower blood pressures.  Abdominal exam is benign.  He is able to follow most commands.  Will obtain labs, urine.  4:56 PM: Attempted to call wife but unable to reach her   5:02 PM:  Attempted to call [facility] but they were unable to locate patient's nurse. They will call back.  I discussed with nurse at [facility] and rehab.  She states she had noticed that patient seemed a little bit more confused week.  Patient had not been wanting to eat.  Did not had any vomiting.  Wife is at bedside now who also provides  additional history and states that she started noticing that patient's urine was darker which is what had been when he had a UTI.  Wife states that he has been on 3 antibiotics during his time there at the aspiration rehab and had recently finished Cipro.  UA does show moderate hemoglobin, large leukocytes, pyuria.  Urine culture sent.  CBC shows hemoglobin of 9.4.  Review of records show that this is his baseline.  CMP shows BUN of 55, creatinine of 2.98.  I reviewed his blood work.  His normal creatinine is between 1.4-1.5.  Lactic is normal.  Chest x-ray shows small right effusion.  At this time, given AKI and UTI, will plan for admission.  Patient started a small bolus of fluids given his EF of 20-25%.  Patient started on Rocephin.  I looked at his urine culture from previous it was sensitive to that.  Discussed with internal medicine team who accepts patient for admission.   Portions of this note were generated with Scientist, clinical (histocompatibility and immunogenetics). Dictation errors may occur despite best attempts at proofreading.  Final Clinical Impression(s) / ED Diagnoses Final diagnoses:  AKI (acute kidney injury) (HCC)  Urinary tract infection with hematuria, site unspecified    Rx / DC Orders ED Discharge Orders    None       Rosana Hoes 09/20/20 2203    Tilden Fossa, MD 09/20/20 2228

## 2020-09-20 NOTE — H&P (Signed)
History and Physical   Ryan Macdonald WLS:[id number] DOB: 12-23-1933 DOA: 09/20/2020  PCP: [facility]   Patient coming from: [facility]  Chief Complaint: Confusion, dark urine, fatigue  HPI: Ryan Macdonald is a 86 y.o. male with medical history significant of CHF (LVEF 20 to 25%, status post AICD), paroxysmal A. fib, hypertension, CKD 3, hyperlipidemia, gout who presents following several days to a week of increased confusion, decreased appetite, and fatigue.  Patient was admitted last month with sepsis due to UTI and has been at [facility] for about a month after this.  Per his wife he is received multiple different antibiotics at [facility] to continue treatments of his UTI.  He has continued to have a degree of confusion since his recent admission that she states was not there prior to that admission.  His level of confusion does appear to be the same as it has been while at [facility].  He has continued to have dark urine throughout his rehab stay.  They deny any fevers, chest pain, shortness of breath, vomiting, constipation, diarrhea, abdominal pain.   ED Course: In the ED patient was noted to have soft blood pressure in the 90s to 100 systolic however this appears to be his baseline when looking at previous records.  Vital signs otherwise stable.  Lab work-up showed creatinine of 2.98 which is increased from his baseline of about 1.5.  Hemoglobin was 9.4 which is consistent with his baseline.  UA showed hemoglobin, protein, leukocytes, bacteria consistent with UTI.  Urine culture and lactic acid were ordered pending.  CT head was without acute abnormality.  Patient received ceftriaxone and small bolus of 250 mL in the ED.  Review of Systems: As per HPI otherwise all other systems reviewed and are negative.  Past Medical History:  Diagnosis Date  . Acute bronchitis 05/10/2017  . Acute respiratory failure (HCC)   . Acute respiratory failure with hypoxia  (HCC) 09/27/2017  . AKI (acute kidney injury) (HCC) 02/2019  . AKI (acute kidney injury) (HCC) 03/14/2019  . Atrial fibrillation (HCC)   . CAP (community acquired pneumonia) 02/03/2017  . Cardiomyopathy (HCC)   . Cat scratch fever    removed mass on right side neck  . Cataracts, bilateral   . CHF (congestive heart failure) (HCC)   . CHF exacerbation (HCC) 09/27/2017  . Chronic kidney disease   . Corns and callosities   . Diabetes mellitus without complication (HCC)    TYPE 2  . Dyspnea   . Erythema intertrigo   . Flat foot   . Gout   . High cholesterol   . Hx of urethral stricture   . Hypertension   . Kidney stones   . Morbid obesity (HCC)   . Nonischemic cardiomyopathy (HCC)    a. ? 2009 Cath in [place] - nl cors per pt;  b. 09/2013 Echo: EF 25-30%, sev diff HK.  . Obesity   . Osteoarthritis   . PAF (paroxysmal atrial fibrillation) (HCC)    a. post-op hip in 2014 - prev on xarelto.  . Renal insufficiency     Past Surgical History:  Procedure Laterality Date  . CARDIAC CATHETERIZATION  1980's  . CARDIOVERSION N/A 01/15/2020   Procedure: CARDIOVERSION;  Surgeon: Laurey Morale, MD;  Location: [facility] ENDOSCOPY;  Service: Cardiovascular;  Laterality: N/A;  . CIRCUMCISION  1940's  . CORONARY STENT INTERVENTION N/A 08/18/2018   Procedure: CORONARY STENT INTERVENTION;  Surgeon: Corky Crafts, MD;  Location: [facility] INVASIVE CV LAB;  Service: Cardiovascular;  Laterality: N/A;  . CYSTOSCOPY WITH URETHRAL DILATATION  10/23/2013  . CYSTOSCOPY WITH URETHRAL DILATATION N/A 10/23/2013   Procedure: CYSTOSCOPY WITH URETHRAL DILATATION;  Surgeon: Kathi Ludwig, MD;  Location: [facility] OR;  Service: Urology;  Laterality: N/A;  . INCISION AND DRAINAGE OF WOUND Right 1980's   "cat scratch" (10/23/2013)  . INGUINAL HERNIA REPAIR Right 1950's  . INTRAVASCULAR ULTRASOUND/IVUS N/A 08/18/2018   Procedure: Intravascular Ultrasound/IVUS;  Surgeon: Corky Crafts, MD;  Location: [facility] INVASIVE CV LAB;   Service: Cardiovascular;  Laterality: N/A;  . KIDNEY STONE SURGERY Right 1970's; 1983   "twice"  . LEFT HEART CATH AND CORONARY ANGIOGRAPHY N/A 08/18/2018   Procedure: LEFT HEART CATH AND CORONARY ANGIOGRAPHY;  Surgeon: Corky Crafts, MD;  Location: [facility] INVASIVE CV LAB;  Service: Cardiovascular;  Laterality: N/A;  . RIGHT HEART CATH N/A 02/19/2020   Procedure: RIGHT HEART CATH;  Surgeon: Laurey Morale, MD;  Location: [facility] INVASIVE CV LAB;  Service: Cardiovascular;  Laterality: N/A;  . TEE WITHOUT CARDIOVERSION N/A 01/15/2020   Procedure: TRANSESOPHAGEAL ECHOCARDIOGRAM (TEE);  Surgeon: Laurey Morale, MD;  Location: [facility] ENDOSCOPY;  Service: Cardiovascular;  Laterality: N/A;  . TONSILLECTOMY AND ADENOIDECTOMY  1940's  . TOTAL HIP ARTHROPLASTY Left 10/23/2013   Procedure: TOTAL HIP ARTHROPLASTY;  Surgeon: Valeria Batman, MD;  Location: [facility] OR;  Service: Orthopedics;  Laterality: Left;    Social History  reports that he has quit smoking. His smoking use included cigarettes and cigars. He has a 15.00 pack-year smoking history. He has quit using smokeless tobacco. He reports that he does not drink alcohol and does not use drugs.  Allergies  Allergen Reactions  . Lisinopril Swelling    Facial swelling  . Xarelto [Rivaroxaban] Other (See Comments)    "Blood came out of my penis"    Family History  Problem Relation Age of Onset  . Heart disease Mother   . Heart Problems Maternal Grandmother   . Other Other        negative for premature CAD  Reviewed on admission  Prior to Admission medications   Medication Sig Start Date End Date Taking? Authorizing Provider  allopurinol (ZYLOPRIM) 100 MG tablet Take 200 mg by mouth daily.    Yes [provider]  amiodarone (PACERONE) 200 MG tablet Take 1 tablet (200 mg total) by mouth daily. 05/29/20  Yes Laurey Morale, MD  apixaban (ELIQUIS) 2.5 MG TABS tablet Take 1 tablet (2.5 mg total) by mouth 2 (two) times daily. 03/27/20  Yes  Laurey Morale, MD  benzonatate (TESSALON) 100 MG capsule Take 1 capsule (100 mg total) by mouth 3 (three) times daily as needed for cough. 08/21/20  Yes Osvaldo Shipper, MD  cyanocobalamin 1000 MCG tablet Take 1 tablet (1,000 mcg total) by mouth daily. 03/13/19  Yes Laurey Morale, MD  diclofenac Sodium (VOLTAREN) 1 % GEL Apply 4 g topically 4 (four) times daily. Patient taking differently: Apply 4 g topically 2 (two) times daily as needed.  02/24/20  Yes Sheikh, Omair Latif, DO  finasteride (PROSCAR) 5 MG tablet Take 1 tablet (5 mg total) by mouth daily. Patient taking differently: Take 5 mg by mouth in the morning and at bedtime.  11/29/18  Yes Alford Highland, NP  HYDROcodone-acetaminophen (NORCO/VICODIN) 5-325 MG tablet Take 1 tablet by mouth 3 (three) times daily as needed for severe pain. 08/21/20  Yes Osvaldo Shipper, MD  ipratropium-albuterol (DUONEB) 0.5-2.5 (3)  MG/3ML SOLN Take 3 mLs by nebulization every 4 (four) hours as needed. 05/14/17  Yes Pearson Grippe, MD  iron polysaccharides (NIFEREX) 150 MG capsule Take 1 capsule (150 mg total) by mouth daily. 08/21/20  Yes Osvaldo Shipper, MD  levothyroxine (SYNTHROID) 125 MCG tablet Take 1 tablet (125 mcg total) by mouth daily before breakfast. 08/01/19  Yes Carlus Pavlov, MD  lidocaine (LIDODERM) 5 % Place 1 patch onto the skin daily. Remove & Discard patch within 12 hours or as directed by MD 08/22/20  Yes Osvaldo Shipper, MD  magnesium oxide (MAG-OX) 400 MG tablet Take 400 mg by mouth daily.    Yes [provider]  metolazone (ZAROXOLYN) 2.5 MG tablet Take 2.5 mg by mouth daily. Every wednesday   Yes [provider]  polyethylene glycol (MIRALAX / GLYCOLAX) 17 g packet Take 17 g by mouth 2 (two) times daily. 02/24/20  Yes Sheikh, Omair Latif, DO  rosuvastatin (CRESTOR) 10 MG tablet Take 1 tablet (10 mg total) by mouth daily at 6 PM. 12/22/18  Yes [name], MD  saccharomyces boulardii (FLORASTOR) 250 MG capsule Take 1  capsule (250 mg total) by mouth 2 (two) times daily. 08/21/20  Yes Osvaldo Shipper, MD  senna-docusate (SENOKOT-S) 8.6-50 MG tablet Take 1 tablet by mouth 2 (two) times daily. 02/24/20  Yes Sheikh, Omair Latif, DO  sertraline (ZOLOFT) 25 MG tablet Take 25 mg by mouth daily.    Yes [provider]  spironolactone (ALDACTONE) 25 MG tablet Take 12.5 mg by mouth at bedtime.   Yes [provider]  torsemide (DEMADEX) 20 MG tablet Take 4 tablets (80 mg total) by mouth 2 (two) times daily. 08/21/20  Yes Osvaldo Shipper, MD    Physical Exam: Vitals:   09/20/20 1800 09/20/20 1815 09/20/20 2100 09/20/20 2251  BP: (!) 100/52 (!) 98/54 (!) 105/55 (!) 110/56  Pulse: (!) 58 (!) 58 64 63  Resp: Temp:    (!) 97.4 F (36.3 C)  TempSrc:    Oral  SpO2: 96% 97% 97% 96%  Weight:    74.4 kg  Height:     (1.702 m)   Physical Exam Constitutional:      General: He is not in acute distress.    Appearance: Normal appearance.     Comments: Chonically ill appearing elderly male  HENT:     Head: Normocephalic and atraumatic.     Mouth/Throat:     Mouth: Mucous membranes are dry.     Pharynx: Oropharynx is clear.  Eyes:     Extraocular Movements: Extraocular movements intact.     Pupils: Pupils are equal, round, and reactive to light.  Cardiovascular:     Rate and Rhythm: Normal rate and regular rhythm.     Pulses: Normal pulses.     Heart sounds: Normal heart sounds.  Pulmonary:     Effort: Pulmonary effort is normal. No respiratory distress.     Breath sounds: Normal breath sounds.  Abdominal:     General: Bowel sounds are normal. There is no distension.     Palpations: Abdomen is soft.     Tenderness: There is no abdominal tenderness.  Musculoskeletal:        General: No swelling or deformity.  Skin:    General: Skin is warm and dry.  Neurological:     General: No focal deficit present.     Mental Status: Mental status is at baseline.    Labs on Admission: I  have personally reviewed following labs and imaging studies  CBC: Recent Labs  Lab 09/20/20 1506  WBC 6.6  NEUTROABS 5.2  HGB 9.4*  HCT 29.9*  MCV 98.4  PLT 229    Basic Metabolic Panel: Recent Labs  Lab 09/20/20 1506  NA 138  K 3.9  CL 103  CO2 23  GLUCOSE 89  BUN 55*  CREATININE 2.98*  CALCIUM 8.9    GFR: Estimated Creatinine Clearance: 17.3 mL/min (A) (by C-G formula based on SCr of 2.98 mg/dL (H)).  Liver Function Tests: Recent Labs  Lab 09/20/20 1506  AST 55*  ALT 22  ALKPHOS 85  BILITOT 1.4*  PROT 7.3  ALBUMIN 2.1*    Urine analysis:    Component Value Date/Time   COLORURINE AMBER (A) 09/20/2020 1600   APPEARANCEUR CLOUDY (A) 09/20/2020 1600   LABSPEC 1.011 09/20/2020 1600   PHURINE 5.0 09/20/2020 1600   GLUCOSEU NEGATIVE 09/20/2020 1600   HGBUR MODERATE (A) 09/20/2020 1600   BILIRUBINUR NEGATIVE 09/20/2020 1600   KETONESUR NEGATIVE 09/20/2020 1600   PROTEINUR 100 (A) 09/20/2020 1600   UROBILINOGEN 0.2 05/26/2015 1521   NITRITE NEGATIVE 09/20/2020 1600   LEUKOCYTESUR LARGE (A) 09/20/2020 1600    Radiological Exams on Admission: DG Chest 2 View  Result Date: 09/20/2020 CLINICAL DATA:  Altered mental status EXAM: CHEST - 2 VIEW COMPARISON:  08/16/2020 FINDINGS: LEFT-sided pacemaker overlies normal cardiac silhouette. Small RIGHT effusion. No pulmonary edema. No infiltrate. No pneumothorax. IMPRESSION: Small RIGHT effusion.  No acute cardiopulmonary findings otherwise. Electronically Signed   By: Genevive Bi M.D.   On: 09/20/2020 15:41   CT Head Wo Contrast  Result Date: 09/20/2020 CLINICAL DATA:  Altered mental status for a month. EXAM: CT HEAD WITHOUT CONTRAST TECHNIQUE: Contiguous axial images were obtained from the base of the skull through the vertex without intravenous contrast. COMPARISON:  08/16/2020 FINDINGS: Brain: No evidence of acute infarction, hemorrhage, hydrocephalus, extra-axial collection or mass lesion/mass effect.  Diffuse cerebral atrophy. Ventricular dilatation is consistent with central atrophy. Patchy low-attenuation changes in the deep white matter consistent with small vessel ischemia. Vascular: Moderate intracranial arterial vascular calcifications. Skull: Calvarium appears intact. Sinuses/Orbits: Paranasal sinuses and mastoid air cells are clear. Other: No significant change since previous study. IMPRESSION: 1. No acute intracranial abnormalities. 2. Chronic atrophy and small vessel ischemia. Electronically Signed   By: Burman Nieves M.D.   On: 09/20/2020 19:27   CUP PACEART REMOTE DEVICE CHECK  Result Date: 09/20/2020 Scheduled remote reviewed. Normal device function.  Next remote 91 days.   EKG: Independently reviewed.  Sinus rhythm, first-degree AV block, T wave flattening lateral leads.  Assessment/Plan Active Problems:   Essential hypertension   PAF (paroxysmal atrial fibrillation) (HCC)   Chronic renal insufficiency, stage 3 (moderate) (HCC)   Gout   CHF (congestive heart failure) (HCC)   UTI (urinary tract infection)   Acute renal failure superimposed on stage 3 chronic kidney disease (HCC)   Anemia of chronic disease   Urinary tract infection > Urinalysis consistent with UTI; previously admitted for sepsis secondary to UTI. > Ceftriaxone started in the ED based on prior sensitivities - Continue ceftriaxone - Follow-up urine culture and LA - Follow CBC  Acute renal failure on CKD 3 > Is on chronic diuretic therapy given his systolic heart failure and has had decreased p.o. intake, possibility that he is volume down due to this combination. > His BUN is not significantly elevated, so there is a possibility of AIN and he has  been on allopurinol and Cipro, will need to investigate further if creatinine does not improve with fluids > Will check renal ultrasound for completeness as he is on finasteride - Continue slow rate of IV fluids at 75 cc/h - Hold allopurinol, now off  Cipro - Renal ultrasound - Follow BMP  Hypertension Paroxysmal A. Fib CHFrEF: Last echo in September with EF of 20 to 25%, AICD in place. > Holding diuretics (torsemide, metolazone, spironolactone) in the setting of AKI and volume depletion - Continue home amiodarone and Eliquis - Monitor I/O, and daily weights - Follow BMP  Hypothyroidism - Continue home Synthroid  Hyperlipidemia - Continue home atorvastatin  Gout - Holding home allopurinol for now as above   DVT prophylaxis: Eliquis  Code Status:   Full  Family Communication:  Wife updated at bedside  Disposition Plan:   Patient is from:  [facility] DC to:  [facility]  Anticipated DC date:  10/25    Consults called:  None  Admission status:  Inpatient, telemetry   Severity of Illness: The appropriate patient status for this patient is INPATIENT. Inpatient status is judged to be reasonable and necessary in order to provide the required intensity of service to ensure the patient's safety. The patient's presenting symptoms, physical exam findings, and initial radiographic and laboratory data in the context of their chronic comorbidities is felt to place them at high risk for further clinical deterioration. Furthermore, it is not anticipated that the patient will be medically stable for discharge from the hospital within 2 midnights of admission. The following factors support the patient status of inpatient.   " The patient's presenting symptoms include confusion, lethargy, decreased appetite. " The worrisome physical exam findings include lethargy, confusion. " The initial radiographic and laboratory data are worrisome because of acute on chronic renal failure, anemia. " The chronic co-morbidities include CHF, A. fib, hypertension, CKD.   * I certify that at the point of admission it is my clinical judgment that the patient will require inpatient hospital care spanning beyond 2 midnights from the point of  admission due to high intensity of service, high risk for further deterioration and high frequency of surveillance required.Synetta Fail MD [facility]  How to contact the [facility] Attending or Consulting provider 7A - 7P or covering provider during after hours 7P -7A, for this patient?   1. Check the care team in [facility] and look for a) attending/consulting [facility] provider listed and b) the [facility] team listed 2. Log into [email or web address] and use [place]'s universal password to access. If you do not have the password, please contact the hospital operator. 3. Locate the [facility] provider you are looking for under [facility] and page to a number that you can be directly reached. 4. If you still have difficulty reaching the provider, please page the [facility] (Director on Call) for the Hospitalists listed on amion for assistance.  09/20/2020, 11:46 PM

## 2020-09-20 NOTE — Progress Notes (Signed)
Patient arrived to unit. Left heel has wound that patient says is "from gout" pain 5/10. Patient placed on telemetry, has pacemaker. Will continue to monitor.

## 2020-09-21 ENCOUNTER — Other Ambulatory Visit: Payer: Self-pay

## 2020-09-21 DIAGNOSIS — I5022 Chronic systolic (congestive) heart failure: Secondary | ICD-10-CM

## 2020-09-21 DIAGNOSIS — R319 Hematuria, unspecified: Secondary | ICD-10-CM

## 2020-09-21 DIAGNOSIS — N390 Urinary tract infection, site not specified: Principal | ICD-10-CM

## 2020-09-21 DIAGNOSIS — N179 Acute kidney failure, unspecified: Secondary | ICD-10-CM

## 2020-09-21 LAB — CBC
HCT: 28.8 % — ABNORMAL LOW (ref 39.0–52.0)
Hemoglobin: 9.2 g/dL — ABNORMAL LOW (ref 13.0–17.0)
MCH: 31 pg (ref 26.0–34.0)
MCHC: 31.9 g/dL (ref 30.0–36.0)
MCV: 97 fL (ref 80.0–100.0)
Platelets: 236 10*3/uL (ref 150–400)
RBC: 2.97 MIL/uL — ABNORMAL LOW (ref 4.22–5.81)
RDW: 23.9 % — ABNORMAL HIGH (ref 11.5–15.5)
WBC: 6.7 10*3/uL (ref 4.0–10.5)
nRBC: 0 % (ref 0.0–0.2)

## 2020-09-21 LAB — BASIC METABOLIC PANEL WITH GFR
Anion gap: 11 (ref 5–15)
BUN: 55 mg/dL — ABNORMAL HIGH (ref 8–23)
CO2: 25 mmol/L (ref 22–32)
Calcium: 8.7 mg/dL — ABNORMAL LOW (ref 8.9–10.3)
Chloride: 104 mmol/L (ref 98–111)
Creatinine, Ser: 2.96 mg/dL — ABNORMAL HIGH (ref 0.61–1.24)
GFR, Estimated: 20 mL/min — ABNORMAL LOW
Glucose, Bld: 85 mg/dL (ref 70–99)
Potassium: 4.3 mmol/L (ref 3.5–5.1)
Sodium: 140 mmol/L (ref 135–145)

## 2020-09-21 LAB — MRSA PCR SCREENING: MRSA by PCR: NEGATIVE

## 2020-09-21 MED ORDER — SODIUM CHLORIDE 0.9 % IV SOLN
INTRAVENOUS | Status: DC | PRN
  Administered 2020-09-21: 250 mL via INTRAVENOUS

## 2020-09-21 MED ORDER — SODIUM CHLORIDE 0.9 % IV SOLN: INTRAVENOUS | Status: DC

## 2020-09-21 NOTE — Consult Note (Addendum)
WOC Nurse Consult Note: Reason for Consult:Chronic wound on posterior left heel. See photo added to EMR by Dr. Verner Mould. Wound type: pressure Pressure Injury POA: Yes Measurement:to be obtained by Bedside RN today and documented on nursing flow sheet Wound bed: 80% pink, dry with deeply hued center indicative of potential for depth Drainage (amount, consistency, odor) None Periwound: dry Dressing procedure/placement/frequency: I have provided Nursing with conservative care orders for this healing wound using an antimicrobial nonadherent dressing (Xeroform) secured with dry gauze and a few turns with a Kerlix roll gauze/paper tape. The foot is to be placed into a pressure redistribution heel boot.  WOC nursing team will not follow, but will remain available to this patient, the nursing and medical teams.  Please re-consult if needed. Thanks, Ladona Mow, MSN, RN, GNP, [place]  Pager# [phone]

## 2020-09-21 NOTE — Progress Notes (Signed)
Pt was asked to urinate on demand to collect a fresh urine sample with  Success. Urine sample sent to lab For cultures.

## 2020-09-21 NOTE — Plan of Care (Signed)
  Problem: Education: Goal: Knowledge of General Education information will improve Description: Including pain rating scale, medication(s)/side effects and non-pharmacologic comfort measures Outcome: Progressing   Problem: Health Behavior/Discharge Planning: Goal: Ability to manage health-related needs will improve Outcome: Progressing   Problem: Pain Managment: Goal: General experience of comfort will improve Outcome: Progressing   

## 2020-09-21 NOTE — Progress Notes (Signed)
Pt has been asleep most of the day with periods of confusion, fidgeting, and withdrawal patient refused to eat breaks meds supper. Drink 1 ensure and 2 bites of chicken and mac and cheese. Urine output decreased and dark amber, Urine cultures collected.  IV fluids and antibiotics where ordered and implemented. WOC and treatment orders where place for left heel pressure orders. Wound care completed.Wife visited today and was updated

## 2020-09-21 NOTE — Progress Notes (Signed)
Progress Note    Ryan Macdonald  WIO:[id number] DOB: 11-Jul-1934  DOA: 09/20/2020 PCP: [facility]    Brief Narrative:     Medical records reviewed and are as summarized below:  Ryan Macdonald is an 86 y.o. male with medical history significant of CHF (LVEF 20 to 25%, status post AICD), paroxysmal A. fib, hypertension, CKD 3, hyperlipidemia, gout who presents following several days to a week of increased confusion, decreased appetite, and fatigue.  Patient was admitted last month with sepsis due to UTI and has been at [facility] for about a month after this.  Per his wife he is received multiple different antibiotics at [facility] to continue treatments of his UTI.  He has continued to have a degree of confusion since his recent admission that she states was not there prior to that admission.  His level of confusion does appear to be the same as it has been while at [facility].  He has continued to have dark urine throughout his rehab stay.    Assessment/Plan:   Active Problems:   Essential hypertension   PAF (paroxysmal atrial fibrillation) (HCC)   Chronic renal insufficiency, stage 3 (moderate) (HCC)   Gout   CHF (congestive heart failure) (HCC)   UTI (urinary tract infection)   Acute renal failure superimposed on stage 3 chronic kidney disease (HCC)   Anemia of chronic disease   Urinary tract infection > Urinalysis consistent with UTI; previously admitted for sepsis secondary to UTI. > Ceftriaxone started in the ED based on prior sensitivities - Continue ceftriaxone - Follow-up urine culture- asked RN to be sure sent down on 10/24   Acute renal failure on CKD 3 > Is on chronic diuretic therapy given his systolic heart failure and has had decreased p.o. intake, possibility that he is volume down due to this combination. - Continue slow rate of IV fluids at 75 cc/h (not ordered when I saw this AM) - Hold allopurinol, now off Cipro - Renal  ultrasound: medical renal disease - Follow BMP  Hypertension Paroxysmal A. Fib CHFrEF: Last echo in September with EF of 20 to 25%, AICD in place. -Holding diuretics (torsemide, metolazone, spironolactone) in the setting of AKI and volume depletion - Continue home amiodarone and Eliquis - Monitor I/O, and daily weights - Follow BMP -gentle IVF  Hypothyroidism - Continue home Synthroid  Hyperlipidemia - Continue home atorvastatin  Gout - Holding home allopurinol for now as above   Pressure ulcer on heel: WOC consult POA antimicrobial nonadherent dressing (Xeroform) secured with dry gauze and a few turns with a Kerlix roll gauze/paper tape. The foot is to be placed into a pressure redistribution heel boot. Photos under media    Family Communication/Anticipated D/C date and plan/Code Status   DVT prophylaxis: eliquis Code Status: Full Code.  Disposition Plan: Status is: Inpatient  Remains inpatient appropriate because:Inpatient level of care appropriate due to severity of illness   Dispo: The patient is from: SNF              Anticipated d/c is to: SNF              Anticipated d/c date is: 2 days              Patient currently is not medically stable to d/c.         Medical Consultants:    None.   Subjective:   C/o heel pain  Objective:  Vitals:   09/20/20 2251 09/21/20 0016 09/21/20 0313 09/21/20 0849  BP: (!) 110/56 (!) 99/49 112/69 (!) 103/58  Pulse: 63 62 80 69  Resp: 19 18 15 16   Temp: (!) 97.4 F (36.3 C) 98.1 F (36.7 C) 98.1 F (36.7 C) (!) 97.2 F (36.2 C)  TempSrc: Oral Oral Oral Oral  SpO2: 96% 94% 97% 96%  Weight: 74.4 kg  74.8 kg   Height: 5\' 7"  (1.702 m)       Intake/Output Summary (Last 24 hours) at 09/21/2020 1228 Last data filed at 09/21/2020 0846 Gross per 24 hour  Intake 590 ml  Output 250 ml  Net 340 ml   Filed Weights   09/20/20 2251 09/21/20 0313  Weight: 74.4 kg 74.8 kg    Exam:  General:  Appearance:     Chronically ill appearing male in no acute distress     Lungs:     respirations unlabored  Heart:    Normal heart rate. No murmurs, rubs, or gallops.   MS:   All extremities are intact.  Wound on heel: see media tab for photo  Neurologic:   Awake, alert, pleasant     Data Reviewed:   I have personally reviewed following labs and imaging studies:  Labs: Labs show the following:   Basic Metabolic Panel: Recent Labs  Lab 09/20/20 1506 09/21/20 0429  NA 138 140  K 3.9 4.3  CL 103 104  CO2 23 25  GLUCOSE 89 85  BUN 55* 55*  CREATININE 2.98* 2.96*  CALCIUM 8.9 8.7*   GFR Estimated Creatinine Clearance: 17.4 mL/min (A) (by C-G formula based on SCr of 2.96 mg/dL (H)). Liver Function Tests: Recent Labs  Lab 09/20/20 1506  AST 55*  ALT 22  ALKPHOS 85  BILITOT 1.4*  PROT 7.3  ALBUMIN 2.1*   No results for input(s): LIPASE, AMYLASE in the last 168 hours. No results for input(s): AMMONIA in the last 168 hours. Coagulation profile No results for input(s): INR, PROTIME in the last 168 hours.  CBC: Recent Labs  Lab 09/20/20 1506 09/21/20 0429  WBC 6.6 6.7  NEUTROABS 5.2  --   HGB 9.4* 9.2*  HCT 29.9* 28.8*  MCV 98.4 97.0  PLT 229 236   Cardiac Enzymes: No results for input(s): CKTOTAL, CKMB, CKMBINDEX, TROPONINI in the last 168 hours. BNP (last 3 results) No results for input(s): PROBNP in the last 8760 hours. CBG: Recent Labs  Lab 09/20/20 1443  GLUCAP 87   D-Dimer: No results for input(s): DDIMER in the last 72 hours. Hgb A1c: No results for input(s): HGBA1C in the last 72 hours. Lipid Profile: No results for input(s): CHOL, HDL, LDLCALC, TRIG, CHOLHDL, LDLDIRECT in the last 72 hours. Thyroid function studies: No results for input(s): TSH, T4TOTAL, T3FREE, THYROIDAB in the last 72 hours.  Invalid input(s): FREET3 Anemia work up: No results for input(s): VITAMINB12, FOLATE, FERRITIN, TIBC, IRON, RETICCTPCT in the last 72 hours. Sepsis  Labs: Recent Labs  Lab 09/20/20 1506 09/20/20 2005 09/21/20 0429  WBC 6.6  --  6.7  LATICACIDVEN 1.8 1.7  --     Microbiology Recent Results (from the past 240 hour(s))  Respiratory Panel by RT PCR (Flu A&B, Covid) - Nasopharyngeal Swab     Status: None   Collection Time: 09/20/20  8:06 PM   Specimen: Nasopharyngeal Swab  Result Value Ref Range Status   SARS Coronavirus 2 by RT PCR NEGATIVE NEGATIVE Final    Comment: (NOTE) SARS-CoV-2 target nucleic  acids are NOT DETECTED.  The SARS-CoV-2 RNA is generally detectable in upper respiratoy specimens during the acute phase of infection. The lowest concentration of SARS-CoV-2 viral copies this assay can detect is 131 copies/mL. A negative result does not preclude SARS-Cov-2 infection and should not be used as the sole basis for treatment or other patient management decisions. A negative result may occur with  improper specimen collection/handling, submission of specimen other than nasopharyngeal swab, presence of viral mutation(s) within the areas targeted by this assay, and inadequate number of viral copies (<131 copies/mL). A negative result must be combined with clinical observations, patient history, and epidemiological information. The expected result is Negative.  Fact Sheet for Patients:  https://www.moore.com/  Fact Sheet for Healthcare Providers:  https://www.young.biz/  This test is no t yet approved or cleared by the [place] FDA and  has been authorized for detection and/or diagnosis of SARS-CoV-2 by FDA under an Emergency Use Authorization (EUA). This EUA will remain  in effect (meaning this test can be used) for the duration of the COVID-19 declaration under Section 564(b)(1) of the Act, 21 U.S.C. section 360bbb-3(b)(1), unless the authorization is terminated or revoked sooner.     Influenza A by PCR NEGATIVE NEGATIVE Final   Influenza B by PCR NEGATIVE NEGATIVE Final     Comment: (NOTE) The Xpert Xpress SARS-CoV-2/FLU/RSV assay is intended as an aid in  the diagnosis of influenza from Nasopharyngeal swab specimens and  should not be used as a sole basis for treatment. Nasal washings and  aspirates are unacceptable for Xpert Xpress SARS-CoV-2/FLU/RSV  testing.  Fact Sheet for Patients: https://www.moore.com/  Fact Sheet for Healthcare Providers: https://www.young.biz/  This test is not yet approved or cleared by the [place] FDA and  has been authorized for detection and/or diagnosis of SARS-CoV-2 by  FDA under an Emergency Use Authorization (EUA). This EUA will remain  in effect (meaning this test can be used) for the duration of the  Covid-19 declaration under Section 564(b)(1) of the Act, 21  U.S.C. section 360bbb-3(b)(1), unless the authorization is  terminated or revoked. Performed at [facility], [address] [place], [place] [address]   MRSA PCR Screening     Status: None   Collection Time: 09/21/20  3:47 AM   Specimen: Nasal Mucosa; Nasopharyngeal  Result Value Ref Range Status   MRSA by PCR NEGATIVE NEGATIVE Final    Comment:        The GeneXpert MRSA Assay (FDA approved for NASAL specimens only), is one component of a comprehensive MRSA colonization surveillance program. It is not intended to diagnose MRSA infection nor to guide or monitor treatment for MRSA infections. Performed at [facility], [address], [place], [place] [address]     Procedures and diagnostic studies:  DG Chest 2 View  Result Date: 09/20/2020 CLINICAL DATA:  Altered mental status EXAM: CHEST - 2 VIEW COMPARISON:  08/16/2020 FINDINGS: LEFT-sided pacemaker overlies normal cardiac silhouette. Small RIGHT effusion. No pulmonary edema. No infiltrate. No pneumothorax. IMPRESSION: Small RIGHT effusion.  No acute cardiopulmonary findings otherwise. Electronically Signed   By: Genevive Bi M.D.    On: 09/20/2020 15:41   CT Head Wo Contrast  Result Date: 09/20/2020 CLINICAL DATA:  Altered mental status for a month. EXAM: CT HEAD WITHOUT CONTRAST TECHNIQUE: Contiguous axial images were obtained from the base of the skull through the vertex without intravenous contrast. COMPARISON:  08/16/2020 FINDINGS: Brain: No evidence of acute infarction, hemorrhage, hydrocephalus, extra-axial  collection or mass lesion/mass effect. Diffuse cerebral atrophy. Ventricular dilatation is consistent with central atrophy. Patchy low-attenuation changes in the deep white matter consistent with small vessel ischemia. Vascular: Moderate intracranial arterial vascular calcifications. Skull: Calvarium appears intact. Sinuses/Orbits: Paranasal sinuses and mastoid air cells are clear. Other: No significant change since previous study. IMPRESSION: 1. No acute intracranial abnormalities. 2. Chronic atrophy and small vessel ischemia. Electronically Signed   By: Burman Nieves M.D.   On: 09/20/2020 19:27   US RENAL  Result Date: 09/21/2020 CLINICAL DATA:  Acute kidney injury EXAM: RENAL / URINARY TRACT ULTRASOUND COMPLETE COMPARISON:  None. FINDINGS: Right Kidney: Renal measurements: 6.7 x 3.9 x 4.3 cm = volume: 59 mL. Slightly increased echogenicity is seen. There is an anechoic cyst seen within the lower pole measuring 0.9 x 0.9 x 0.9 cm. There are shadowing calculi seen within the midpole measuring up to 1.7 cm in transverse dimension. No hydronephrosis. Left Kidney: Renal measurements: 11.9 x 6.4 x 5.6 cm = volume: 226 mL. Increased echogenicity seen throughout. There are 2 anechoic cysts the largest measuring 2.1 x 1.7 x 2.2 cm. There are shadowing calculi measuring 6 mm seen within the midpole. No hydronephrosis. Bladder: Layering debris seen within the bladder. Other: None. IMPRESSION: Nonobstructing bilateral renal calculi. Diffusely increased parenchymal echogenicity, consistent with medical renal disease. Electronically  Signed   By: Jonna Clark M.D.   On: 09/21/2020 00:15    Medications:   . amiodarone  200 mg Oral Daily  . apixaban  2.5 mg Oral BID  . finasteride  5 mg Oral Daily  . levothyroxine  125 mcg Oral QAC breakfast  . magnesium oxide  400 mg Oral Daily  . polyethylene glycol  17 g Oral BID  . rosuvastatin  10 mg Oral q1800   Continuous Infusions: . sodium chloride 75 mL/hr at 09/21/20 0929  . cefTRIAXone (ROCEPHIN)  IV       LOS: 1 day   Joseph Art  [facility]   How to contact the [facility] Attending or Consulting provider 7A - 7P or covering provider during after hours 7P -7A, for this patient?  1. Check the care team in [facility] and look for a) attending/consulting TR[facility] provider listed and b) the [facility] team listed 2. Log into [email or web address] and use [place] universal password to access. If you do not have the password, please contact the hospital operator. 3. Locate the [facility] provider you are looking for under [facility] and page to a number that you can be directly reached. 4. If you still have difficulty reaching the provider, please page the [facility] (Director on Call) for the Hospitalists listed on amion for assistance.  09/21/2020, 12:28 PM

## 2020-09-22 ENCOUNTER — Encounter (HOSPITAL_COMMUNITY): Payer: No Typology Code available for payment source

## 2020-09-22 DIAGNOSIS — I10 Essential (primary) hypertension: Secondary | ICD-10-CM

## 2020-09-22 LAB — BASIC METABOLIC PANEL
Anion gap: 8 (ref 5–15)
BUN: 50 mg/dL — ABNORMAL HIGH (ref 8–23)
CO2: 25 mmol/L (ref 22–32)
Calcium: 8.5 mg/dL — ABNORMAL LOW (ref 8.9–10.3)
Chloride: 111 mmol/L (ref 98–111)
Creatinine, Ser: 2.45 mg/dL — ABNORMAL HIGH (ref 0.61–1.24)
GFR, Estimated: 25 mL/min — ABNORMAL LOW (ref >60)
Glucose, Bld: 96 mg/dL (ref 70–99)
Potassium: 3.9 mmol/L (ref 3.5–5.1)
Sodium: 144 mmol/L (ref 135–145)

## 2020-09-22 LAB — CBC
HCT: 25.1 % — ABNORMAL LOW (ref 39.0–52.0)
Hemoglobin: 8.1 g/dL — ABNORMAL LOW (ref 13.0–17.0)
MCH: 31.3 pg (ref 26.0–34.0)
MCHC: 32.3 g/dL (ref 30.0–36.0)
MCV: 96.9 fL (ref 80.0–100.0)
Platelets: 205 10*3/uL (ref 150–400)
RBC: 2.59 MIL/uL — ABNORMAL LOW (ref 4.22–5.81)
RDW: 24.3 % — ABNORMAL HIGH (ref 11.5–15.5)
WBC: 6.6 10*3/uL (ref 4.0–10.5)
nRBC: 0 % (ref 0.0–0.2)

## 2020-09-22 LAB — URINE CULTURE: Culture: NO GROWTH

## 2020-09-22 MED ORDER — ADULT MULTIVITAMIN W/MINERALS CH
1.0000 | ORAL_TABLET | Freq: Every day | ORAL | Status: DC
  Administered 2020-09-22 – 2020-09-25 (×4): 1 via ORAL
  Filled 2020-09-22 (×4): qty 1

## 2020-09-22 MED ORDER — ENSURE ENLIVE PO LIQD
237.0000 mL | Freq: Two times a day (BID) | ORAL | Status: DC
  Administered 2020-09-23 – 2020-09-25 (×3): 237 mL via ORAL

## 2020-09-22 NOTE — Evaluation (Signed)
Physical Therapy Evaluation Patient Details Name: Ryan Macdonald MRN: [id number] DOB: 1934/08/16 Today's Date: 09/22/2020   History of Present Illness  86yo male presenting with increased confusion, reduced appetite, and fatigue; note recent admit about a month ago for septic UTI after which he was discharged to [facility] for rehab. Now admitted with UTI. PMH CHF s/p AICD, A-fib, HTN, CKD, HLD, gout, cardiomyopathy, DM, cardiac cath, THA, CSI  Clinical Impression   Patient received in bed, very pleasant and cooperative but fairly self limiting. Definite cognitive and STM deficits noted today. Participated in modified MMT at bed level, then attempted rolling however unable to complete with one person assist due to gross weakness and no initiation from patient whatsoever. Will require +2 assist for mobility. Left in bed positioned to comfort with all needs met, bed alarm active. Recommend return to SNF when medically ready.     Follow Up Recommendations SNF;Supervision/Assistance - 24 hour    Equipment Recommendations  Other (comment) (defer to next venue)    Recommendations for Other Services       Precautions / Restrictions Precautions Precautions: Fall;Other (comment);ICD/Pacemaker Precaution Comments: considerable L heel wound Restrictions Weight Bearing Restrictions: No Other Position/Activity Restrictions: unsure if there are any WB restrictions L LE given extent of heel ulcer      Mobility  Bed Mobility Overal bed mobility: Needs Assistance Bed Mobility: Rolling Rolling: Total assist         General bed mobility comments: attempted rolling with totalA, however unable to complete motion due to no assist or initiation from patient- will need +2 to progress mobility    Transfers                 General transfer comment: unable  Ambulation/Gait             General Gait Details: unable  Stairs            Wheelchair Mobility    Modified  Rankin (Stroke Patients Only)       Balance                                             Pertinent Vitals/Pain Pain Assessment: No/denies pain    Home Living Family/patient expects to be discharged to:: Skilled nursing facility                 Additional Comments: still at [facility]    Prior Function Level of Independence: Needs assistance   Gait / Transfers Assistance Needed: tells me there are usually 2 people helping him and they use a lift to get up him to [facility]  ADL's / Homemaking Assistance Needed: totalA        Hand Dominance        Extremity/Trunk Assessment   Upper Extremity Assessment Upper Extremity Assessment: Generalized weakness    Lower Extremity Assessment Lower Extremity Assessment: Generalized weakness (BLEs 3-/5 at best grossly)    Cervical / Trunk Assessment Cervical / Trunk Assessment: Kyphotic  Communication   Communication: No difficulties  Cognition Arousal/Alertness: Awake/alert Behavior During Therapy: Flat affect Overall Cognitive Status: No family/caregiver present to determine baseline cognitive functioning Area of Impairment: Orientation;Attention;Memory;Following commands;Safety/judgement;Awareness;Problem solving                 Orientation Level: Disoriented to;Situation;Time Current Attention Level: Sustained Memory: Decreased short-term memory Following Commands: Follows one  step commands consistently;Follows one step commands with increased time Safety/Judgement: Decreased awareness of deficits;Decreased awareness of safety Awareness: Intellectual Problem Solving: Slow processing;Decreased initiation;Difficulty sequencing;Requires verbal cues;Requires tactile cues General Comments: definite STM deficits- tells me he has been bed bound for a very long time now, but per chart was ambulating with PT in late september 2021      General Comments General comments (skin integrity, edema, etc.):  unable to get to EOB to assess balance today    Exercises     Assessment/Plan    PT Assessment Patient needs continued PT services  PT Problem List Decreased strength;Decreased cognition;Decreased knowledge of use of DME;Obesity;Decreased activity tolerance;Decreased safety awareness;Decreased balance;Decreased skin integrity;Decreased knowledge of precautions;Decreased mobility;Decreased coordination       PT Treatment Interventions DME instruction;Balance training;Gait training;Stair training;Cognitive remediation;Functional mobility training;Patient/family education;Therapeutic activities;Therapeutic exercise;Wheelchair mobility training    PT Goals (Current goals can be found in the Care Plan section)  Acute Rehab PT Goals PT Goal Formulation: Patient unable to participate in goal setting Time For Goal Achievement: 10/06/20 Potential to Achieve Goals: Fair    Frequency Min 2X/week   Barriers to discharge        Co-evaluation               AM-PAC PT "6 Clicks" Mobility  Outcome Measure Help needed turning from your back to your side while in a flat bed without using bedrails?: Total Help needed moving from lying on your back to sitting on the side of a flat bed without using bedrails?: Total Help needed moving to and from a bed to a chair (including a wheelchair)?: Total Help needed standing up from a chair using your arms (e.g., wheelchair or bedside chair)?: Total Help needed to walk in hospital room?: Total Help needed climbing 3-5 steps with a railing? : Total 6 Click Score: 6    End of Session   Activity Tolerance: Patient tolerated treatment well Patient left: in bed;with call bell/phone within reach;with bed alarm set Nurse Communication: Mobility status;Need for lift equipment PT Visit Diagnosis: Muscle weakness (generalized) (M62.81);Other abnormalities of gait and mobility (R26.89);Difficulty in walking, not elsewhere classified (R26.2);Unsteadiness on  feet (R26.81)    Time: 7011-0034 PT Time Calculation (min) (ACUTE ONLY): 17 min   Charges:   PT Evaluation $PT Eval Moderate Complexity: 1 Mod          Windell Norfolk, DPT, PN1   Supplemental Physical Therapist [place]    Pager [phone] Acute Rehab Office [phone]

## 2020-09-22 NOTE — Progress Notes (Signed)
Initial Nutrition Assessment  DOCUMENTATION CODES:   Non-severe (moderate) malnutrition in context of chronic illness  INTERVENTION:   -Continue Ensure Enlive po BID, each supplement provides 350 kcal and 20 grams of protein -MVI with minerals daily -Magic cup TID with meals, each supplement provides 290 kcal and 9 grams of protein  NUTRITION DIAGNOSIS:   Moderate Malnutrition related to chronic illness (CHF) as evidenced by percent weight loss, mild fat depletion, mild muscle depletion.  GOAL:   Patient will meet greater than or equal to 90% of their needs  MONITOR:   PO intake, Supplement acceptance, Labs, Weight trends, Skin, I & O's  REASON FOR ASSESSMENT:   Consult Assessment of nutrition requirement/status, Poor PO, Wound healing  ASSESSMENT:   Ryan Macdonald is a 86 y.o. male with medical history significant of CHF (LVEF 20 to 25%, status post AICD), paroxysmal A. fib, hypertension, CKD 3, hyperlipidemia, gout who presents following several days to a week of increased confusion, decreased appetite, and fatigue  Pt admitted with UTI.   Reviewed I/O's: +1.6 L x 24 hours and +2.1 L since admission  UOP: 725 ml x 24 hours  Spoke with pt at bedside, who was pleasant and in good spirits today. Observed breakfast tray on table; pt consumed about 10% of meal, including about 1/3 of Ensure Enlive supplement. Pt reports that he has had a poor appetite "for 45 years". He shares he eats only what he feels he is able to. He confirms he receives 3 meals per day at [facility], but is unsure how much he usually eats ("it's not like I keep a record of it"). He denies any difficulty chewing or swallowing foods.   Pt endorses weight loss, but unsure of UBW or amount of weight he has lost. Reviewed wt hx; noted pt has experienced a 14.1% wt loss over the past 3 months, which is significant for time frame.   RD discussed importance of good meal and supplement intake to promote  healing. He is amenable to continue Ensure supplements.   Labs reviewed.   NUTRITION - FOCUSED PHYSICAL EXAM:    Most Recent Value  Orbital Region Mild depletion  Upper Arm Region Mild depletion  Thoracic and Lumbar Region No depletion  Buccal Region No depletion  Temple Region Mild depletion  Clavicle Bone Region Moderate depletion  Clavicle and Acromion Bone Region Mild depletion  Scapular Bone Region Mild depletion  Dorsal Hand Mild depletion  Patellar Region Mild depletion  Anterior Thigh Region Mild depletion  Posterior Calf Region Mild depletion  Edema (RD Assessment) Mild  Hair Reviewed  Eyes Reviewed  Mouth Reviewed  Skin Reviewed  Nails Reviewed       Diet Order:   Diet Order            Diet Heart Room service appropriate? Yes; Fluid consistency: Thin; Fluid restriction: 2000 mL Fluid  Diet effective now                 EDUCATION NEEDS:   Education needs have been addressed  Skin:  Skin Assessment: Skin Integrity Issues: Skin Integrity Issues:: Other (Comment) Other: non-pressure wound to lt heel  Last BM:  Unknown  Height:   Ht Readings from Last 1 Encounters:  09/20/20 5\' 7"  (1.702 m)    Weight:   Wt Readings from Last 1 Encounters:  09/22/20 77.6 kg    Ideal Body Weight:  67.3 kg  BMI:  Body mass index is 26.78 kg/m.  Estimated  Nutritional Needs:   Kcal:  2100-2300  Protein:  100-115 grams  Fluid:  2 L    Levada Schilling, RD, LDN, CDCES Registered Dietitian II Certified Diabetes Care and Education Specialist Please refer to [facility] for RD and/or RD on-call/weekend/after hours pager

## 2020-09-22 NOTE — Social Work (Signed)
Pt from [facility], can return when medically stable.

## 2020-09-22 NOTE — Progress Notes (Signed)
Progress Note    Ryan Macdonald  MLY:[id number] DOB: 1934/03/31  DOA: 09/20/2020 PCP: [facility]    Brief Narrative:     Medical records reviewed and are as summarized below:  Ryan Macdonald is an 86 y.o. male with medical history significant of CHF (LVEF 20 to 25%, status post AICD), paroxysmal A. fib, hypertension, CKD 3, hyperlipidemia, gout who presents following several days to a week of increased confusion, decreased appetite, and fatigue.  Patient was admitted last month with sepsis due to UTI and has been at [facility] for about a month after this.  Per his wife he is received multiple different antibiotics at [facility] to continue treatments of his UTI.  He has continued to have a degree of confusion since his recent admission that she states was not there prior to that admission.  His level of confusion does appear to be the same as it has been while at [facility].  He has continued to have dark urine throughout his rehab stay.     Assessment/Plan:   Active Problems:   Essential hypertension   PAF (paroxysmal atrial fibrillation) (HCC)   Chronic renal insufficiency, stage 3 (moderate) (HCC)   Gout   CHF (congestive heart failure) (HCC)   UTI (urinary tract infection)   Acute renal failure superimposed on stage 3 chronic kidney disease (HCC)   Anemia of chronic disease   Urinary tract infection > Urinalysis consistent with UTI; previously admitted for sepsis secondary to UTI. > Ceftriaxone started in the ED based on prior sensitivities - Continue ceftriaxone - Follow-up urine culture- appears to have been sent after IV abx started   Acute renal failure on CKD 3 > Is on chronic diuretic therapy given his systolic heart failure and has had decreased p.o. intake, possibility that he is volume down due to this combination. - Continue slow rate of IV fluids at 75 cc/h  - Hold allopurinol, now off Cipro - Renal ultrasound: medical  renal disease - labs not back for today yet  Hypertension Paroxysmal A. Fib CHFrEF: Last echo in September with EF of 20 to 25%, AICD in place. -Holding diuretics (torsemide, metolazone, spironolactone) in the setting of AKI and volume depletion - Continue home amiodarone and Eliquis - Monitor I/O, and daily weights - Follow BMP -gentle IVF  Hypothyroidism - Continue home Synthroid  Hyperlipidemia - Continue home atorvastatin  Gout - Holding home allopurinol for now as above   Pressure ulcer on heel: WOC consult POA antimicrobial nonadherent dressing (Xeroform) secured with dry gauze and a few turns with a Kerlix roll gauze/paper tape. The foot is to be placed into a pressure redistribution heel boot. Photos under media    Family Communication/Anticipated D/C date and plan/Code Status   DVT prophylaxis: eliquis Code Status: Full Code.  Disposition Plan: Status is: Inpatient  Remains inpatient appropriate because:Inpatient level of care appropriate due to severity of illness   Dispo: The patient is from: SNF              Anticipated d/c is to: SNF              Anticipated d/c date is: 2 days              Patient currently is not medically stable to d/c.         Medical Consultants:    None.   Subjective:   Says he feels fine and he always feels fine  Does not eat much  Objective:    Vitals:   09/21/20 1517 09/21/20 2105 09/22/20 0550 09/22/20 0849  BP: 116/72 120/76 (!) 107/58 (!) 101/56  Pulse: (!) 105 (!) 101 74 71  Resp: 16  (!) 22   Temp: (!) 97.4 F (36.3 C) 98.5 F (36.9 C) 99.1 F (37.3 C) 98.9 F (37.2 C)  TempSrc: Oral Oral Oral Oral  SpO2: 99% 96% 99% 98%  Weight:   77.6 kg   Height:        Intake/Output Summary (Last 24 hours) at 09/22/2020 5027 Last data filed at 09/22/2020 0700 Gross per 24 hour  Intake 2369.37 ml  Output 575 ml  Net 1794.37 ml   Filed Weights   09/20/20 2251 09/21/20 0313 09/22/20 0550  Weight:  74.4 kg 74.8 kg 77.6 kg    Exam:   General: Appearance:     Overweight male in no acute distress     Lungs:     respirations unlabored  Heart:    Normal heart rate.   MS:   All extremities are intact.   Neurologic:   Awake, alert, cooperative    Data Reviewed:   I have personally reviewed following labs and imaging studies:  Labs: Labs show the following:   Basic Metabolic Panel: Recent Labs  Lab 09/20/20 1506 09/21/20 0429  NA 138 140  K 3.9 4.3  CL 103 104  CO2 23 25  GLUCOSE 89 85  BUN 55* 55*  CREATININE 2.98* 2.96*  CALCIUM 8.9 8.7*   GFR Estimated Creatinine Clearance: 17.4 mL/min (A) (by C-G formula based on SCr of 2.96 mg/dL (H)). Liver Function Tests: Recent Labs  Lab 09/20/20 1506  AST 55*  ALT 22  ALKPHOS 85  BILITOT 1.4*  PROT 7.3  ALBUMIN 2.1*   No results for input(s): LIPASE, AMYLASE in the last 168 hours. No results for input(s): AMMONIA in the last 168 hours. Coagulation profile No results for input(s): INR, PROTIME in the last 168 hours.  CBC: Recent Labs  Lab 09/20/20 1506 09/21/20 0429  WBC 6.6 6.7  NEUTROABS 5.2  --   HGB 9.4* 9.2*  HCT 29.9* 28.8*  MCV 98.4 97.0  PLT 229 236   Cardiac Enzymes: No results for input(s): CKTOTAL, CKMB, CKMBINDEX, TROPONINI in the last 168 hours. BNP (last 3 results) No results for input(s): PROBNP in the last 8760 hours. CBG: Recent Labs  Lab 09/20/20 1443  GLUCAP 87   D-Dimer: No results for input(s): DDIMER in the last 72 hours. Hgb A1c: No results for input(s): HGBA1C in the last 72 hours. Lipid Profile: No results for input(s): CHOL, HDL, LDLCALC, TRIG, CHOLHDL, LDLDIRECT in the last 72 hours. Thyroid function studies: No results for input(s): TSH, T4TOTAL, T3FREE, THYROIDAB in the last 72 hours.  Invalid input(s): FREET3 Anemia work up: No results for input(s): VITAMINB12, FOLATE, FERRITIN, TIBC, IRON, RETICCTPCT in the last 72 hours. Sepsis Labs: Recent Labs  Lab  09/20/20 1506 09/20/20 2005 09/21/20 0429  WBC 6.6  --  6.7  LATICACIDVEN 1.8 1.7  --     Microbiology Recent Results (from the past 240 hour(s))  Respiratory Panel by RT PCR (Flu A&B, Covid) - Nasopharyngeal Swab     Status: None   Collection Time: 09/20/20  8:06 PM   Specimen: Nasopharyngeal Swab  Result Value Ref Range Status   SARS Coronavirus 2 by RT PCR NEGATIVE NEGATIVE Final    Comment: (NOTE) SARS-CoV-2 target nucleic acids are NOT DETECTED.  The SARS-CoV-2 RNA is generally detectable in upper respiratoy specimens during the acute phase of infection. The lowest concentration of SARS-CoV-2 viral copies this assay can detect is 131 copies/mL. A negative result does not preclude SARS-Cov-2 infection and should not be used as the sole basis for treatment or other patient management decisions. A negative result may occur with  improper specimen collection/handling, submission of specimen other than nasopharyngeal swab, presence of viral mutation(s) within the areas targeted by this assay, and inadequate number of viral copies (<131 copies/mL). A negative result must be combined with clinical observations, patient history, and epidemiological information. The expected result is Negative.  Fact Sheet for Patients:  https://www.moore.com/  Fact Sheet for Healthcare Providers:  https://www.young.biz/  This test is no t yet approved or cleared by the [place] FDA and  has been authorized for detection and/or diagnosis of SARS-CoV-2 by FDA under an Emergency Use Authorization (EUA). This EUA will remain  in effect (meaning this test can be used) for the duration of the COVID-19 declaration under Section 564(b)(1) of the Act, 21 U.S.C. section 360bbb-3(b)(1), unless the authorization is terminated or revoked sooner.     Influenza A by PCR NEGATIVE NEGATIVE Final   Influenza B by PCR NEGATIVE NEGATIVE Final    Comment: (NOTE) The  Xpert Xpress SARS-CoV-2/FLU/RSV assay is intended as an aid in  the diagnosis of influenza from Nasopharyngeal swab specimens and  should not be used as a sole basis for treatment. Nasal washings and  aspirates are unacceptable for Xpert Xpress SARS-CoV-2/FLU/RSV  testing.  Fact Sheet for Patients: https://www.moore.com/  Fact Sheet for Healthcare Providers: https://www.young.biz/  This test is not yet approved or cleared by the [place] FDA and  has been authorized for detection and/or diagnosis of SARS-CoV-2 by  FDA under an Emergency Use Authorization (EUA). This EUA will remain  in effect (meaning this test can be used) for the duration of the  Covid-19 declaration under Section 564(b)(1) of the Act, 21  U.S.C. section 360bbb-3(b)(1), unless the authorization is  terminated or revoked. Performed at [facility], [address] [place], [place] [address]   MRSA PCR Screening     Status: None   Collection Time: 09/21/20  3:47 AM   Specimen: Nasal Mucosa; Nasopharyngeal  Result Value Ref Range Status   MRSA by PCR NEGATIVE NEGATIVE Final    Comment:        The GeneXpert MRSA Assay (FDA approved for NASAL specimens only), is one component of a comprehensive MRSA colonization surveillance program. It is not intended to diagnose MRSA infection nor to guide or monitor treatment for MRSA infections. Performed at [facility], [address], [place], [place] [address]   Culture, Urine     Status: None   Collection Time: 09/21/20 10:41 AM   Specimen: Urine, Random  Result Value Ref Range Status   Specimen Description URINE, RANDOM  Final   Special Requests NONE  Final   Culture   Final    NO GROWTH Performed at [facility], [address] [place], [place] [address]    Report Status 09/22/2020 FINAL  Final    Procedures and diagnostic studies:  DG Chest 2 View  Result Date: 09/20/2020 CLINICAL DATA:   Altered mental status EXAM: CHEST - 2 VIEW COMPARISON:  08/16/2020 FINDINGS: LEFT-sided pacemaker overlies normal cardiac silhouette. Small RIGHT effusion. No pulmonary edema. No infiltrate. No pneumothorax. IMPRESSION: Small RIGHT effusion.  No acute cardiopulmonary findings  otherwise. Electronically Signed   By: Genevive Bi M.D.   On: 09/20/2020 15:41   CT Head Wo Contrast  Result Date: 09/20/2020 CLINICAL DATA:  Altered mental status for a month. EXAM: CT HEAD WITHOUT CONTRAST TECHNIQUE: Contiguous axial images were obtained from the base of the skull through the vertex without intravenous contrast. COMPARISON:  08/16/2020 FINDINGS: Brain: No evidence of acute infarction, hemorrhage, hydrocephalus, extra-axial collection or mass lesion/mass effect. Diffuse cerebral atrophy. Ventricular dilatation is consistent with central atrophy. Patchy low-attenuation changes in the deep white matter consistent with small vessel ischemia. Vascular: Moderate intracranial arterial vascular calcifications. Skull: Calvarium appears intact. Sinuses/Orbits: Paranasal sinuses and mastoid air cells are clear. Other: No significant change since previous study. IMPRESSION: 1. No acute intracranial abnormalities. 2. Chronic atrophy and small vessel ischemia. Electronically Signed   By: Burman Nieves M.D.   On: 09/20/2020 19:27   US RENAL  Result Date: 09/21/2020 CLINICAL DATA:  Acute kidney injury EXAM: RENAL / URINARY TRACT ULTRASOUND COMPLETE COMPARISON:  None. FINDINGS: Right Kidney: Renal measurements: 6.7 x 3.9 x 4.3 cm = volume: 59 mL. Slightly increased echogenicity is seen. There is an anechoic cyst seen within the lower pole measuring 0.9 x 0.9 x 0.9 cm. There are shadowing calculi seen within the midpole measuring up to 1.7 cm in transverse dimension. No hydronephrosis. Left Kidney: Renal measurements: 11.9 x 6.4 x 5.6 cm = volume: 226 mL. Increased echogenicity seen throughout. There are 2 anechoic cysts the  largest measuring 2.1 x 1.7 x 2.2 cm. There are shadowing calculi measuring 6 mm seen within the midpole. No hydronephrosis. Bladder: Layering debris seen within the bladder. Other: None. IMPRESSION: Nonobstructing bilateral renal calculi. Diffusely increased parenchymal echogenicity, consistent with medical renal disease. Electronically Signed   By: Jonna Clark M.D.   On: 09/21/2020 00:15    Medications:   . amiodarone  200 mg Oral Daily  . apixaban  2.5 mg Oral BID  . finasteride  5 mg Oral Daily  . levothyroxine  125 mcg Oral QAC breakfast  . magnesium oxide  400 mg Oral Daily  . polyethylene glycol  17 g Oral BID  . rosuvastatin  10 mg Oral q1800   Continuous Infusions: . sodium chloride 75 mL/hr at 09/21/20 2210  . sodium chloride Stopped (09/21/20 1742)  . cefTRIAXone (ROCEPHIN)  IV 1 g (09/21/20 1742)     LOS: 2 days   Joseph Art  [facility]   How to contact the [facility] Attending or Consulting provider 7A - 7P or covering provider during after hours 7P -7A, for this patient?  1. Check the care team in [employer] and look for a) attending/consulting [facility] provider listed and b) the [facility] team listed 2. Log into [email or web address] and use [place]'s universal password to access. If you do not have the password, please contact the hospital operator. 3. Locate the [facility] provider you are looking for under [facility] and page to a number that you can be directly reached. 4. If you still have difficulty reaching the provider, please page the [facility] (Director on Call) for the Hospitalists listed on amion for assistance.  09/22/2020, 9:23 AM

## 2020-09-23 DIAGNOSIS — E440 Moderate protein-calorie malnutrition: Secondary | ICD-10-CM | POA: Insufficient documentation

## 2020-09-23 DIAGNOSIS — E43 Unspecified severe protein-calorie malnutrition: Secondary | ICD-10-CM | POA: Insufficient documentation

## 2020-09-23 LAB — BASIC METABOLIC PANEL
Anion gap: 8 (ref 5–15)
BUN: 41 mg/dL — ABNORMAL HIGH (ref 8–23)
CO2: 24 mmol/L (ref 22–32)
Calcium: 8.4 mg/dL — ABNORMAL LOW (ref 8.9–10.3)
Chloride: 114 mmol/L — ABNORMAL HIGH (ref 98–111)
Creatinine, Ser: 2.04 mg/dL — ABNORMAL HIGH (ref 0.61–1.24)
GFR, Estimated: 32 mL/min — ABNORMAL LOW (ref >60)
Glucose, Bld: 79 mg/dL (ref 70–99)
Potassium: 4.1 mmol/L (ref 3.5–5.1)
Sodium: 146 mmol/L — ABNORMAL HIGH (ref 135–145)

## 2020-09-23 LAB — CBC
HCT: 25.8 % — ABNORMAL LOW (ref 39.0–52.0)
Hemoglobin: 8.3 g/dL — ABNORMAL LOW (ref 13.0–17.0)
MCH: 31.6 pg (ref 26.0–34.0)
MCHC: 32.2 g/dL (ref 30.0–36.0)
MCV: 98.1 fL (ref 80.0–100.0)
Platelets: 207 10*3/uL (ref 150–400)
RBC: 2.63 MIL/uL — ABNORMAL LOW (ref 4.22–5.81)
RDW: 24.5 % — ABNORMAL HIGH (ref 11.5–15.5)
WBC: 7.5 10*3/uL (ref 4.0–10.5)
nRBC: 0 % (ref 0.0–0.2)

## 2020-09-23 MED ORDER — SODIUM CHLORIDE 0.45 % IV SOLN: INTRAVENOUS | Status: AC

## 2020-09-23 MED ORDER — POLYETHYLENE GLYCOL 3350 17 G PO PACK: 17.0000 g | PACK | Freq: Every day | ORAL | Status: DC | PRN

## 2020-09-23 NOTE — Progress Notes (Signed)
Progress Note    Ryan Macdonald  KPT:[id number] DOB: 1934-05-08  DOA: 09/20/2020 PCP: [facility]    Brief Narrative:     Medical records reviewed and are as summarized below:  Ryan Macdonald is an 86 y.o. male with medical history significant of CHF (LVEF 20 to 25%, status post AICD), paroxysmal A. fib, hypertension, CKD 3, hyperlipidemia, gout who presents following several days to a week of increased confusion, decreased appetite, and fatigue.  Patient was admitted last month with sepsis due to UTI and has been at [facility] for about a month after this.  Per his wife he is received multiple different antibiotics at [facility] to continue treatments of his UTI.  He has continued to have a degree of confusion since his recent admission that she states was not there prior to that admission.  His level of confusion does appear to be the same as it has been while at [facility].  He has continued to have dark urine throughout his rehab stay.     Assessment/Plan:   Active Problems:   Essential hypertension   PAF (paroxysmal atrial fibrillation) (HCC)   Chronic renal insufficiency, stage 3 (moderate) (HCC)   Gout   CHF (congestive heart failure) (HCC)   UTI (urinary tract infection)   Acute renal failure superimposed on stage 3 chronic kidney disease (HCC)   Anemia of chronic disease   Malnutrition of moderate degree   Urinary tract infection > Urinalysis consistent with UTI; previously admitted for sepsis secondary to UTI. > Ceftriaxone started in the ED based on prior sensitivities - Continue ceftriaxone - Follow-up urine culture- appears to have been sent after IV abx started so not sure is accurate -? If hematuria related to infection- last few U/As have has blood: reviewed CT scan from 2020: Atrophic right kidney with multiple nonobstructing calculi measuring up to 2.9 cm-- patient is not c/o flank pain)  Acute renal failure on CKD 3a > Is  on chronic diuretic therapy given his systolic heart failure and has had decreased p.o. intake, possibility that he is volume down due to this combination. - Continue slow rate of IV fluids (will place a stop date of 10/26 so his fluid status can be re-assessed in AM to avoid volume overload) And encouraged PO water intake for now - Hold allopurinol, now off Cipro - Renal ultrasound: medical renal disease - Cr trending down slowly  Hypertension Paroxysmal A. Fib CHFrEF: Last echo in September with EF of 20 to 25%, AICD in place. -Holding diuretics (torsemide, metolazone, spironolactone) in the setting of AKI and volume depletion (follows with Dr. Shirlee Latch) - Continue home amiodarone and Eliquis - Monitor I/O, and daily weights - Follow BMP -gentle IVF (stop date placed)  Hypothyroidism - Continue home Synthroid  Hyperlipidemia - Continue home atorvastatin  Gout - Holding home allopurinol for now as above   Pressure ulcer on heel: WOC consult POA antimicrobial nonadherent dressing (Xeroform) secured with dry gauze and a few turns with a Kerlix roll gauze/paper tape. The foot is to be placed into a pressure redistribution heel boot. Photos under media  Poor PO intake -weight has dropped from 98 to 76.7 since July -patient is still a full code -discussed with wife re: palliative care-- she not quite ready to have a serious conversation.  She states his memory get better and he does better after IV abx -he likes Glucerna and will drink those -wife to bring in food  from he likes  Family Communication/Anticipated D/C date and plan/Code Status   DVT prophylaxis: eliquis Code Status: Full Code.  Disposition Plan: Status is: Inpatient  Remains inpatient appropriate because:Inpatient level of care appropriate due to severity of illness   Dispo: The patient is from: SNF              Anticipated d/c is to: SNF              Anticipated d/c date is: 2 days              Patient  currently is not medically stable to d/c. needs continued IVF due to AKI and poor po intake         Medical Consultants:    None.   Subjective:   Not very hungry but is thirsty this AM  Objective:    Vitals:   09/22/20 1124 09/22/20 2203 09/23/20 0118 09/23/20 0600  BP: 108/70 110/64  (!) 104/56  Pulse: 80 74  69  Resp: 18 15  (!) 23  Temp: 99 F (37.2 C) 99.6 F (37.6 C)  99.3 F (37.4 C)  TempSrc: Oral Oral  Oral  SpO2: 97% 97%  98%  Weight:   76.7 kg   Height:        Intake/Output Summary (Last 24 hours) at 09/23/2020 1037 Last data filed at 09/23/2020 0900 Gross per 24 hour  Intake 1570 ml  Output 1300 ml  Net 270 ml   Filed Weights   09/21/20 0313 09/22/20 0550 09/23/20 0118  Weight: 74.8 kg 77.6 kg 76.7 kg    Exam:   General: Appearance:     Overweight male in no acute distress     Lungs:     respirations unlabored  Heart:    Normal heart rate.  No murmurs, rubs, or gallops.      Neurologic:   Awake, alert, able to make needs known     Data Reviewed:   I have personally reviewed following labs and imaging studies:  Labs: Labs show the following:   Basic Metabolic Panel: Recent Labs  Lab 09/20/20 1506 09/20/20 1506 09/21/20 0429 09/21/20 0429 09/22/20 1040 09/23/20 0231  NA 138  --  140  --  144 146*  K 3.9   < > 4.3   < > 3.9 4.1  CL 103  --  104  --  111 114*  CO2 23  --  25  --  25 24  GLUCOSE 89  --  85  --  96 79  BUN 55*  --  55*  --  50* 41*  CREATININE 2.98*  --  2.96*  --  2.45* 2.04*  CALCIUM 8.9  --  8.7*  --  8.5* 8.4*   < > = values in this interval not displayed.   GFR Estimated Creatinine Clearance: 25.2 mL/min (A) (by C-G formula based on SCr of 2.04 mg/dL (H)). Liver Function Tests: Recent Labs  Lab 09/20/20 1506  AST 55*  ALT 22  ALKPHOS 85  BILITOT 1.4*  PROT 7.3  ALBUMIN 2.1*   No results for input(s): LIPASE, AMYLASE in the last 168 hours. No results for input(s): AMMONIA in the last 168  hours. Coagulation profile No results for input(s): INR, PROTIME in the last 168 hours.  CBC: Recent Labs  Lab 09/20/20 1506 09/21/20 0429 09/22/20 1040 09/23/20 0231  WBC 6.6 6.7 6.6 7.5  NEUTROABS 5.2  --   --   --  HGB 9.4* 9.2* 8.1* 8.3*  HCT 29.9* 28.8* 25.1* 25.8*  MCV 98.4 97.0 96.9 98.1  PLT 229 236 205 207   Cardiac Enzymes: No results for input(s): CKTOTAL, CKMB, CKMBINDEX, TROPONINI in the last 168 hours. BNP (last 3 results) No results for input(s): PROBNP in the last 8760 hours. CBG: Recent Labs  Lab 09/20/20 1443  GLUCAP 87   D-Dimer: No results for input(s): DDIMER in the last 72 hours. Hgb A1c: No results for input(s): HGBA1C in the last 72 hours. Lipid Profile: No results for input(s): CHOL, HDL, LDLCALC, TRIG, CHOLHDL, LDLDIRECT in the last 72 hours. Thyroid function studies: No results for input(s): TSH, T4TOTAL, T3FREE, THYROIDAB in the last 72 hours.  Invalid input(s): FREET3 Anemia work up: No results for input(s): VITAMINB12, FOLATE, FERRITIN, TIBC, IRON, RETICCTPCT in the last 72 hours. Sepsis Labs: Recent Labs  Lab 09/20/20 1506 09/20/20 2005 09/21/20 0429 09/22/20 1040 09/23/20 0231  WBC 6.6  --  6.7 6.6 7.5  LATICACIDVEN 1.8 1.7  --   --   --     Microbiology Recent Results (from the past 240 hour(s))  Respiratory Panel by RT PCR (Flu A&B, Covid) - Nasopharyngeal Swab     Status: None   Collection Time: 09/20/20  8:06 PM   Specimen: Nasopharyngeal Swab  Result Value Ref Range Status   SARS Coronavirus 2 by RT PCR NEGATIVE NEGATIVE Final    Comment: (NOTE) SARS-CoV-2 target nucleic acids are NOT DETECTED.  The SARS-CoV-2 RNA is generally detectable in upper respiratoy specimens during the acute phase of infection. The lowest concentration of SARS-CoV-2 viral copies this assay can detect is 131 copies/mL. A negative result does not preclude SARS-Cov-2 infection and should not be used as the sole basis for treatment or other  patient management decisions. A negative result may occur with  improper specimen collection/handling, submission of specimen other than nasopharyngeal swab, presence of viral mutation(s) within the areas targeted by this assay, and inadequate number of viral copies (<131 copies/mL). A negative result must be combined with clinical observations, patient history, and epidemiological information. The expected result is Negative.  Fact Sheet for Patients:  https://www.moore.com/  Fact Sheet for Healthcare Providers:  https://www.young.biz/  This test is no t yet approved or cleared by the [place] FDA and  has been authorized for detection and/or diagnosis of SARS-CoV-2 by FDA under an Emergency Use Authorization (EUA). This EUA will remain  in effect (meaning this test can be used) for the duration of the COVID-19 declaration under Section 564(b)(1) of the Act, 21 U.S.C. section 360bbb-3(b)(1), unless the authorization is terminated or revoked sooner.     Influenza A by PCR NEGATIVE NEGATIVE Final   Influenza B by PCR NEGATIVE NEGATIVE Final    Comment: (NOTE) The Xpert Xpress SARS-CoV-2/FLU/RSV assay is intended as an aid in  the diagnosis of influenza from Nasopharyngeal swab specimens and  should not be used as a sole basis for treatment. Nasal washings and  aspirates are unacceptable for Xpert Xpress SARS-CoV-2/FLU/RSV  testing.  Fact Sheet for Patients: https://www.moore.com/  Fact Sheet for Healthcare Providers: https://www.young.biz/  This test is not yet approved or cleared by the [place] FDA and  has been authorized for detection and/or diagnosis of SARS-CoV-2 by  FDA under an Emergency Use Authorization (EUA). This EUA will remain  in effect (meaning this test can be used) for the duration of the  Covid-19 declaration under Section 564(b)(1) of the Act, 21  U.S.C. section  360bbb-3(b)(1), unless the authorization is  terminated or revoked. Performed at [facility], [address] [place], [place] [address]   MRSA PCR Screening     Status: None   Collection Time: 09/21/20  3:47 AM   Specimen: Nasal Mucosa; Nasopharyngeal  Result Value Ref Range Status   MRSA by PCR NEGATIVE NEGATIVE Final    Comment:        The GeneXpert MRSA Assay (FDA approved for NASAL specimens only), is one component of a comprehensive MRSA colonization surveillance program. It is not intended to diagnose MRSA infection nor to guide or monitor treatment for MRSA infections. Performed at [facility], [address], [place], [place] [address]   Culture, Urine     Status: None   Collection Time: 09/21/20 10:41 AM   Specimen: Urine, Random  Result Value Ref Range Status   Specimen Description URINE, RANDOM  Final   Special Requests NONE  Final   Culture   Final    NO GROWTH Performed at [facility], [address], [place], [place] [address]    Report Status 09/22/2020 FINAL  Final    Procedures and diagnostic studies:  No results found.  Medications:   . amiodarone  200 mg Oral Daily  . apixaban  2.5 mg Oral BID  . feeding supplement  237 mL Oral BID BM  . finasteride  5 mg Oral Daily  . levothyroxine  125 mcg Oral QAC breakfast  . magnesium oxide  400 mg Oral Daily  . multivitamin with minerals  1 tablet Oral Daily  . rosuvastatin  10 mg Oral q1800   Continuous Infusions: . sodium chloride 50 mL/hr at 09/23/20 0848  . sodium chloride Stopped (09/21/20 1742)  . cefTRIAXone (ROCEPHIN)  IV 1 g (09/22/20 1715)     LOS: 3 days   Joseph Art  [facility]   How to contact the [facility] Attending or Consulting provider 7A - 7P or covering provider during after hours 7P -7A, for this patient?  1. Check the care team in [facility] and look for a) attending/consulting TRH provider listed and b) the [facility] team listed 2. Log into [email or web address] and  use [place]'s universal password to access. If you do not have the password, please contact the hospital operator. 3. Locate the [facility] provider you are looking for under [facility] and page to a number that you can be directly reached. 4. If you still have difficulty reaching the provider, please page the [facility] (Director on Call) for the Hospitalists listed on amion for assistance.  09/23/2020, 10:37 AM

## 2020-09-23 NOTE — Evaluation (Signed)
Occupational Therapy Evaluation Patient Details Name: Ryan Macdonald MRN: [id number] DOB: 1934-09-24 Today's Date: 09/23/2020    History of Present Illness 86yo male presenting with increased confusion, reduced appetite, and fatigue; note recent admit about a month ago for septic UTI after which he was discharged to [facility] for rehab. Now admitted with UTI. PMH CHF s/p AICD, A-fib, HTN, CKD, HLD, gout, cardiomyopathy, DM, cardiac cath, THA, CSI   Clinical Impression   Pt admitted from [facility]. Unsure PLOF as pt is a poor historian. He currently demonstrates limitations with cognition, strength, and activity tolerance. Pt required maxA to begin rolling in bed. Pt requires setup assistance for eating and grooming. He requires totalA for LB dressing. Evaluation limited as pt appeared to have increase in agitation during session, avoiding eye contact with therapist and grunting when prompted to engage in MMT, bed mobility, ADL. Recommend return to SNF with followup OT services. Will continue to follow acutely.     Follow Up Recommendations  SNF    Equipment Recommendations  Other (comment) (TBD)    Recommendations for Other Services       Precautions / Restrictions Precautions Precautions: Fall;Other (comment);ICD/Pacemaker Precaution Comments: considerable L heel wound Restrictions Weight Bearing Restrictions: No Other Position/Activity Restrictions: unsure if there are any WB restrictions L LE given extent of heel ulcer      Mobility Bed Mobility Overal bed mobility: Needs Assistance Bed Mobility: Rolling Rolling: Max assist         General bed mobility comments: attempted rolling, pt initiating reaching across to side, would require +2 to further roll to sidelying    Transfers                 General transfer comment: unable    Balance                                           ADL either performed or assessed with clinical  judgement   ADL Overall ADL's : Needs assistance/impaired Eating/Feeding: Set up;Sitting   Grooming: Set up;Sitting   Upper Body Bathing: Maximal assistance   Lower Body Bathing: Total assistance   Upper Body Dressing : Maximal assistance   Lower Body Dressing: Total assistance   Toilet Transfer: Maximal assistance;+2 for safety/equipment;+2 for physical assistance   Toileting- Clothing Manipulation and Hygiene: Total assistance         General ADL Comments: pt limited to bed level session, he required setupA for grooming, pt required heavy maxA for rolling R<>L he would require maxA+2 to progress to sidelying;session limited due to pt appearing to become agitated     Vision         Perception     Praxis      Pertinent Vitals/Pain Pain Assessment: No/denies pain     Hand Dominance Right   Extremity/Trunk Assessment Upper Extremity Assessment Upper Extremity Assessment: Generalized weakness   Lower Extremity Assessment Lower Extremity Assessment: Generalized weakness;Defer to PT evaluation   Cervical / Trunk Assessment Cervical / Trunk Assessment: Kyphotic   Communication Communication Communication: No difficulties   Cognition Arousal/Alertness: Awake/alert Behavior During Therapy: Flat affect Overall Cognitive Status: No family/caregiver present to determine baseline cognitive functioning Area of Impairment: Orientation;Attention;Memory;Following commands;Safety/judgement;Awareness;Problem solving                 Orientation Level: Disoriented to;Situation;Time Current Attention Level: Sustained Memory: Decreased short-term  memory Following Commands: Follows one step commands consistently;Follows one step commands with increased time Safety/Judgement: Decreased awareness of deficits;Decreased awareness of safety Awareness: Intellectual Problem Solving: Slow processing;Decreased initiation;Difficulty sequencing;Requires verbal cues;Requires  tactile cues General Comments: Pt appears to have poor memory given different information provided during today's session and information pt provided in PT session yesterday. following MMT in bed, pt appeared to become slightly agitated, avoiding eye contact with therapist   General Comments       Exercises     Shoulder Instructions      Home Living Family/patient expects to be discharged to:: Skilled nursing facility                                 Additional Comments: still at [facility]      Prior Functioning/Environment Level of Independence: Needs assistance  Gait / Transfers Assistance Needed: Pt reports there are usually 2 people helping him with transfers, this session pt reports he was ambulating with staff, noted in PT note pt reports they were lifting him to wc ADL's / Homemaking Assistance Needed: pt reported he was independent with ADL at SNF, unsure accuracy of report   Comments: pt appears to be a poor historian and providing different information to PT and OT        OT Problem List: Decreased strength;Decreased range of motion;Decreased activity tolerance;Impaired balance (sitting and/or standing);Decreased cognition;Decreased safety awareness;Decreased knowledge of use of DME or AE;Decreased knowledge of precautions      OT Treatment/Interventions: Self-care/ADL training;Therapeutic exercise;Energy conservation;DME and/or AE instruction;Therapeutic activities;Cognitive remediation/compensation;Patient/family education;Balance training    OT Goals(Current goals can be found in the care plan section) Acute Rehab OT Goals Patient Stated Goal: "to get out of this place" OT Goal Formulation: With patient Time For Goal Achievement: 10/07/20 Potential to Achieve Goals: Fair ADL Goals Pt Will Perform Grooming: with modified independence;sitting Pt Will Perform Upper Body Bathing: with modified independence;sitting Pt Will Transfer to Toilet: with mod  assist;stand pivot transfer Additional ADL Goal #1: Pt will complete bed mobility with moderate assistance in preparation for ADL.  OT Frequency: Min 2X/week   Barriers to D/C:            Co-evaluation              AM-PAC OT "6 Clicks" Daily Activity     Outcome Measure Help from another person eating meals?: A Little Help from another person taking care of personal grooming?: A Little Help from another person toileting, which includes using toliet, bedpan, or urinal?: Total Help from another person bathing (including washing, rinsing, drying)?: A Lot Help from another person to put on and taking off regular upper body clothing?: A Lot Help from another person to put on and taking off regular lower body clothing?: Total 6 Click Score: 12   End of Session Nurse Communication: Mobility status  Activity Tolerance: Treatment limited secondary to agitation Patient left: in bed;with call bell/phone within reach;with bed alarm set  OT Visit Diagnosis: Other abnormalities of gait and mobility (R26.89);Unsteadiness on feet (R26.81);Muscle weakness (generalized) (M62.81);Other symptoms and signs involving cognitive function                Time: 6378-5885 OT Time Calculation (min): 15 min Charges:  OT General Charges $OT Visit: 1 Visit OT Evaluation $OT Eval Moderate Complexity: 1 Mod  Clydie Dillen OTR/L Acute Rehabilitation Services Office: [phone]   Rebeca Alert 09/23/2020, 2:18  PM

## 2020-09-23 NOTE — Progress Notes (Signed)
Pt had a hard time swallowing ,tendency to pocket food,noticed will cough on and off while eating. MD made aware,

## 2020-09-24 DIAGNOSIS — D638 Anemia in other chronic diseases classified elsewhere: Secondary | ICD-10-CM

## 2020-09-24 DIAGNOSIS — M109 Gout, unspecified: Secondary | ICD-10-CM

## 2020-09-24 DIAGNOSIS — I480 Paroxysmal atrial fibrillation: Secondary | ICD-10-CM

## 2020-09-24 DIAGNOSIS — N183 Chronic kidney disease, stage 3 unspecified: Secondary | ICD-10-CM

## 2020-09-24 LAB — CBC
HCT: 25.7 % — ABNORMAL LOW (ref 39.0–52.0)
Hemoglobin: 8.2 g/dL — ABNORMAL LOW (ref 13.0–17.0)
MCH: 31.2 pg (ref 26.0–34.0)
MCHC: 31.9 g/dL (ref 30.0–36.0)
MCV: 97.7 fL (ref 80.0–100.0)
Platelets: 205 10*3/uL (ref 150–400)
RBC: 2.63 MIL/uL — ABNORMAL LOW (ref 4.22–5.81)
RDW: 24.2 % — ABNORMAL HIGH (ref 11.5–15.5)
WBC: 8.9 10*3/uL (ref 4.0–10.5)
nRBC: 0 % (ref 0.0–0.2)

## 2020-09-24 LAB — SARS CORONAVIRUS 2 BY RT PCR (HOSPITAL ORDER, PERFORMED IN ~~LOC~~ HOSPITAL LAB): SARS Coronavirus 2: NEGATIVE

## 2020-09-24 LAB — BASIC METABOLIC PANEL
Anion gap: 7 (ref 5–15)
BUN: 32 mg/dL — ABNORMAL HIGH (ref 8–23)
CO2: 21 mmol/L — ABNORMAL LOW (ref 22–32)
Calcium: 8.6 mg/dL — ABNORMAL LOW (ref 8.9–10.3)
Chloride: 116 mmol/L — ABNORMAL HIGH (ref 98–111)
Creatinine, Ser: 1.7 mg/dL — ABNORMAL HIGH (ref 0.61–1.24)
GFR, Estimated: 39 mL/min — ABNORMAL LOW (ref >60)
Glucose, Bld: 119 mg/dL — ABNORMAL HIGH (ref 70–99)
Potassium: 3.8 mmol/L (ref 3.5–5.1)
Sodium: 144 mmol/L (ref 135–145)

## 2020-09-24 LAB — GLUCOSE, CAPILLARY: Glucose-Capillary: 99 mg/dL (ref 70–99)

## 2020-09-24 NOTE — Progress Notes (Signed)
Pt is on D5 of ceftriaxone for UTI. Ok to add stop date after today's dose per Dr. Catha Gosselin.  Ulyses Southward, PharmD, BCIDP, AAHIVP, CPP Infectious Disease Pharmacist 09/24/2020 8:50 AM

## 2020-09-24 NOTE — Progress Notes (Signed)
Physical Therapy Treatment Patient Details Name: Ryan Macdonald MRN: [id number] DOB: 1934/08/11 Today's Date: 09/24/2020    History of Present Illness 86yo male presenting with increased confusion, reduced appetite, and fatigue; note recent admit about a month ago for septic UTI after which he was discharged to [facility] for rehab. Now admitted with UTI. PMH CHF s/p AICD, A-fib, HTN, CKD, HLD, gout, cardiomyopathy, DM, cardiac cath, THA, CSI    PT Comments    Pt pleasant and agreeable for PT session this am.  He is pleasantly confused and pt provided re-orientation as he was only oriented to himself.  He was able to progress to standing and performed steps to recliner.  Continue to recommend return back to snf for rehab to improve strength to decrease caregiver burden with transfer activities.      Follow Up Recommendations  SNF;Supervision/Assistance - 24 hour     Equipment Recommendations  Other (comment) (defer to next venue)    Recommendations for Other Services       Precautions / Restrictions Precautions Precautions: Fall;Other (comment);ICD/Pacemaker Precaution Comments: considerable L heel wound Restrictions Weight Bearing Restrictions: No    Mobility  Bed Mobility Overal bed mobility: Needs Assistance Bed Mobility: Supine to Sit     Supine to sit: Mod assist (Heavy mod assistance to advance LEs to edge of bed and elevate trunk into a seated position.)     General bed mobility comments: Pt required increased time and effort noted with R lateral lean once in sitting at edge of bed.  Transfers Overall transfer level: Needs assistance Equipment used: None Transfers: Sit to/from Stand Sit to Stand: From elevated surface;Mod assist         General transfer comment: Pt holding to PTAs shoulders for support with face to face approach.  He was able to take two steps away from bed before turning and sitting in recliner.  Ambulation/Gait Ambulation/Gait  assistance:  (Steps to chair only for transfers OOB.)               Stairs             Wheelchair Mobility    Modified Rankin (Stroke Patients Only)       Balance Overall balance assessment: Needs assistance   Sitting balance-Leahy Scale: Poor Sitting balance - Comments: Lateral lean to the R.     Standing balance-Leahy Scale: Poor Standing balance comment: B UE support to move from surface to surface.                            Cognition Arousal/Alertness: Awake/alert Behavior During Therapy: Flat affect Overall Cognitive Status: No family/caregiver present to determine baseline cognitive functioning Area of Impairment: Orientation                 Orientation Level: Disoriented to;Place;Time;Situation     Following Commands: Follows one step commands consistently;Follows one step commands with increased time Safety/Judgement: Decreased awareness of deficits;Decreased awareness of safety   Problem Solving: Slow processing;Decreased initiation;Difficulty sequencing;Requires verbal cues;Requires tactile cues        Exercises      General Comments        Pertinent Vitals/Pain Pain Assessment: No/denies pain    Home Living                      Prior Function            PT Goals (current goals  can now be found in the care plan section) Acute Rehab PT Goals Patient Stated Goal: "To figure out why I am here." Potential to Achieve Goals: Fair Progress towards PT goals: Progressing toward goals    Frequency    Min 2X/week      PT Plan Current plan remains appropriate    Co-evaluation              AM-PAC PT "6 Clicks" Mobility   Outcome Measure  Help needed turning from your back to your side while in a flat bed without using bedrails?: Total Help needed moving from lying on your back to sitting on the side of a flat bed without using bedrails?: Total Help needed moving to and from a bed to a chair  (including a wheelchair)?: A Lot Help needed standing up from a chair using your arms (e.g., wheelchair or bedside chair)?: A Lot Help needed to walk in hospital room?: A Lot Help needed climbing 3-5 steps with a railing? : Total 6 Click Score: 9    End of Session Equipment Utilized During Treatment: Gait belt Activity Tolerance: Patient tolerated treatment well Patient left: in chair;with call bell/phone within reach;with chair alarm set Nurse Communication: Mobility status (informed mobility tech of transfer. Secure chat sent to RN about transfer.) PT Visit Diagnosis: Muscle weakness (generalized) (M62.81);Other abnormalities of gait and mobility (R26.89);Difficulty in walking, not elsewhere classified (R26.2);Unsteadiness on feet (R26.81)     Time: 3846-6599 PT Time Calculation (min) (ACUTE ONLY): 14 min  Charges:  $Therapeutic Activity: 8-22 mins                     Bonney Leitz , PTA Acute Rehabilitation Services Pager [phone] Office [phone]     Nyhla Mountjoy Artis Delay 09/24/2020, 10:57 AM

## 2020-09-24 NOTE — Discharge Instructions (Signed)

## 2020-09-24 NOTE — Evaluation (Signed)
Clinical/Bedside Swallow Evaluation Patient Details  Name: Ryan Macdonald MRN: [id number] Date of Birth: 25-Mar-1934  Today's Date: 09/24/2020 Time: SLP Start Time (ACUTE ONLY): 1047 SLP Stop Time (ACUTE ONLY): 1056 SLP Time Calculation (min) (ACUTE ONLY): 9 min  Past Medical History:  Past Medical History:  Diagnosis Date  . Acute bronchitis 05/10/2017  . Acute respiratory failure (HCC)   . Acute respiratory failure with hypoxia (HCC) 09/27/2017  . AKI (acute kidney injury) (HCC) 02/2019  . AKI (acute kidney injury) (HCC) 03/14/2019  . Atrial fibrillation (HCC)   . CAP (community acquired pneumonia) 02/03/2017  . Cardiomyopathy (HCC)   . Cat scratch fever    removed mass on right side neck  . Cataracts, bilateral   . CHF (congestive heart failure) (HCC)   . CHF exacerbation (HCC) 09/27/2017  . Chronic kidney disease   . Corns and callosities   . Diabetes mellitus without complication (HCC)    TYPE 2  . Dyspnea   . Erythema intertrigo   . Flat foot   . Gout   . High cholesterol   . Hx of urethral stricture   . Hypertension   . Kidney stones   . Morbid obesity (HCC)   . Nonischemic cardiomyopathy (HCC)    a. ? 2009 Cath in [place] - nl cors per pt;  b. 09/2013 Echo: EF 25-30%, sev diff HK.  . Obesity   . Osteoarthritis   . PAF (paroxysmal atrial fibrillation) (HCC)    a. post-op hip in 2014 - prev on xarelto.  . Renal insufficiency    Past Surgical History:  Past Surgical History:  Procedure Laterality Date  . CARDIAC CATHETERIZATION  1980's  . CARDIOVERSION N/A 01/15/2020   Procedure: CARDIOVERSION;  Surgeon: Laurey Morale, MD;  Location: [facility] ENDOSCOPY;  Service: Cardiovascular;  Laterality: N/A;  . CIRCUMCISION  1940's  . CORONARY STENT INTERVENTION N/A 08/18/2018   Procedure: CORONARY STENT INTERVENTION;  Surgeon: Corky Crafts, MD;  Location: [facility] INVASIVE CV LAB;  Service: Cardiovascular;  Laterality: N/A;  . CYSTOSCOPY WITH URETHRAL DILATATION  10/23/2013   . CYSTOSCOPY WITH URETHRAL DILATATION N/A 10/23/2013   Procedure: CYSTOSCOPY WITH URETHRAL DILATATION;  Surgeon: Kathi Ludwig, MD;  Location: [facility] OR;  Service: Urology;  Laterality: N/A;  . INCISION AND DRAINAGE OF WOUND Right 1980's   "cat scratch" (10/23/2013)  . INGUINAL HERNIA REPAIR Right 1950's  . INTRAVASCULAR ULTRASOUND/IVUS N/A 08/18/2018   Procedure: Intravascular Ultrasound/IVUS;  Surgeon: Corky Crafts, MD;  Location: [facility] INVASIVE CV LAB;  Service: Cardiovascular;  Laterality: N/A;  . KIDNEY STONE SURGERY Right 1970's; 1983   "twice"  . LEFT HEART CATH AND CORONARY ANGIOGRAPHY N/A 08/18/2018   Procedure: LEFT HEART CATH AND CORONARY ANGIOGRAPHY;  Surgeon: Corky Crafts, MD;  Location: [facility] INVASIVE CV LAB;  Service: Cardiovascular;  Laterality: N/A;  . RIGHT HEART CATH N/A 02/19/2020   Procedure: RIGHT HEART CATH;  Surgeon: Laurey Morale, MD;  Location: [facility] INVASIVE CV LAB;  Service: Cardiovascular;  Laterality: N/A;  . TEE WITHOUT CARDIOVERSION N/A 01/15/2020   Procedure: TRANSESOPHAGEAL ECHOCARDIOGRAM (TEE);  Surgeon: Laurey Morale, MD;  Location: [facility] ENDOSCOPY;  Service: Cardiovascular;  Laterality: N/A;  . TONSILLECTOMY AND ADENOIDECTOMY  1940's  . TOTAL HIP ARTHROPLASTY Left 10/23/2013   Procedure: TOTAL HIP ARTHROPLASTY;  Surgeon: Valeria Batman, MD;  Location: [facility] OR;  Service: Orthopedics;  Laterality: Left;   HPI:  Pt is an 86 y.o. male with medical history significant of CHF (LVEF  20 to 25%, status post AICD), paroxysmal A. fib, hypertension, CKD 3, hyperlipidemia, gout who presented following several days to a week of increased confusion, decreased appetite, and fatigue. Patient was admitted last month with sepsis due to UTI and has been at [facility] for about a month after this. CT head was negative for acute changes. CXR 10/23: Small RIGHT effusion. No acute cardiopulmonary findings otherwise. Per RN's note on 10/26 "Pt had a hard time swallowing  ,tendency to pocket food,noticed will cough on and off while eating"   Assessment / Plan / Recommendation Clinical Impression  Pt was seen for bedside swallow evaluation. Pt's current nurse reported that the pt exhibited coughing with pills which were administered whole in apple sauce and pt's nurse from yesterday documented the pt having "a hard time swallowing" and pocketing. Pt exhibited confusion during the assessment and his responses were inconsistently unrelated to the questions, but he reported "trouble" swallowing and stated that he has been crushing his medications at home for the past 6-8 months since they "get hung up". Per the pt, some of the foods here have been too hard for him to masticate. Oral mechanism exam was limited due to pt's difficulty following commands; however, oral motor strength and ROM appeared grossly WFL. He presented with maxillary dentures, but often asked if they were in place. No s/sx of aspiration were demonstrated with solids or >4oz of thin liquids via straw when consecutive swallows were used. Mastication was prolonged with regular texture solids. A dysphagia 2 diet with thin liquids is recommended at this time and SLP will follow pt.  SLP Visit Diagnosis: Dysphagia, unspecified (R13.10)    Aspiration Risk  Mild aspiration risk    Diet Recommendation Dysphagia 2 (Fine chop);Thin liquid   Liquid Administration via: Cup;Straw Medication Administration: Crushed with puree Supervision: Staff to assist with self feeding Compensations: Slow rate;Small sips/bites Postural Changes: Seated upright at 90 degrees    Other  Recommendations Oral Care Recommendations: Oral care BID   Follow up Recommendations  (TBD)      Frequency and Duration min 2x/week  2 weeks       Prognosis Prognosis for Safe Diet Advancement: Good Barriers to Reach Goals: Cognitive deficits      Swallow Study   General Date of Onset: 09/23/20 HPI: Pt is an 86 y.o. male with medical  history significant of CHF (LVEF 20 to 25%, status post AICD), paroxysmal A. fib, hypertension, CKD 3, hyperlipidemia, gout who presented following several days to a week of increased confusion, decreased appetite, and fatigue. Patient was admitted last month with sepsis due to UTI and has been at [facility] for about a month after this. CT head was negative for acute changes. CXR 10/23: Small RIGHT effusion. No acute cardiopulmonary findings otherwise. Per RN's note on 10/26 "Pt had a hard time swallowing ,tendency to pocket food,noticed will cough on and off while eating" Type of Study: Bedside Swallow Evaluation Previous Swallow Assessment: None Diet Prior to this Study: Dysphagia 3 (soft);Thin liquids Temperature Spikes Noted: No Respiratory Status: Room air History of Recent Intubation: No Behavior/Cognition: Alert;Cooperative;Pleasant mood;Confused Oral Cavity Assessment: Within Functional Limits Oral Care Completed by SLP: No Oral Cavity - Dentition: Adequate natural dentition Vision: Functional for self-feeding Self-Feeding Abilities: Able to feed self Patient Positioning: Upright in chair;Postural control adequate for testing Baseline Vocal Quality: Normal Volitional Cough: Strong Volitional Swallow: Able to elicit    Oral/Motor/Sensory Function Overall Oral Motor/Sensory Function: Within functional limits   Ice  Chips Ice chips: Within functional limits Presentation: Spoon   Thin Liquid Thin Liquid: Within functional limits Presentation: Straw    Nectar Thick Nectar Thick Liquid: Not tested   Honey Thick Honey Thick Liquid: Not tested   Puree Puree: Within functional limits Presentation: Spoon   Solid     Solid: Impaired Oral Phase Impairments: Impaired mastication     Alaysia Lightle I. Vear Clock, MS, CCC-SLP Acute Rehabilitation Services Office number [phone] Pager [phone]  [name] 09/24/2020,11:43 AM

## 2020-09-24 NOTE — Plan of Care (Signed)
  Problem: Elimination: Goal: Will not experience complications related to bowel motility Outcome: Progressing Note: Loose stool   Problem: Nutrition: Goal: Adequate nutrition will be maintained Outcome: Progressing  Poor appetite

## 2020-09-24 NOTE — Progress Notes (Signed)
Remote ICD transmission.   

## 2020-09-24 NOTE — Progress Notes (Signed)
PROGRESS NOTE    Ryan Macdonald  QQV:[id number] DOB: 1934/01/18 DOA: 09/20/2020 PCP: [facility]   Brief Narrative:  HPI 09/20/2020 by Dr. Beola Cord Ryan Macdonald is a 86 y.o. male with medical history significant of CHF (LVEF 20 to 25%, status post AICD), paroxysmal A. fib, hypertension, CKD 3, hyperlipidemia, gout who presents following several days to a week of increased confusion, decreased appetite, and fatigue.  Patient was admitted last month with sepsis due to UTI and has been at [facility] for about a month after this.  Per his wife he is received multiple different antibiotics at [facility] to continue treatments of his UTI.  He has continued to have a degree of confusion since his recent admission that she states was not there prior to that admission.  His level of confusion does appear to be the same as it has been while at [facility].  He has continued to have dark urine throughout his rehab stay.  They deny any fevers, chest pain, shortness of breath, vomiting, constipation, diarrhea, abdominal pain.   Interim history Admitted with acute kidney injury and urinary tract infection. Assessment & Plan   Urinary tract infection -Urinalysis consistent with UTI.  Urine culture showed no growth however this culture appears to have been sent after IV antibiotics were started so question the accuracy. -Patient was previously admitted with sepsis secondary to UTI only 1 month ago. -Currently on ceftriaxone, with last dose of today for 5-day course -Patient with hematuria likely related to infection.  Last few UAs noted to have blood.  CT scan from 2020 showed atrophic right kidney with multiple nonobstructive calculi measuring up to 2.9 cm.  Patient is not having any flank pain at this time.  Acute kidney injury on chronic kidney disease, stage IIIb -Patient is on chronic diuretic therapy given CHF.  He has also had decreased oral intake. -Suspect AKI  is secondary to medications along with poor oral intake. -Was placed on IV fluids however were discontinued on 1026 given history of heart failure. -Have encouraged oral intake -Allopurinol has been held.  No longer on ciprofloxacin. -Renal ultrasound obtained showing medical renal disease -Creatinine was 2.98 on admission, down to 1.70 -Continue to monitor BMP  Essential hypertension/Chronic systolic heart failure -Echocardiogram September 21 showed an EF of 20-25%, AICD in place -Diuretics (torsemide, metolazone, spironolactone) currently held due to AKI -BP currently stable -Patient appears to be euvolemic and compensated -Monitor intake and output, daily weights   Paroxysmal atrial fibrillation -Continue amiodarone, Eliquis  Hypothyroidism -Continue Synthroid  Hyperlipidemia -Continue statin  Gout -Allopurinol currently held given AKI  Pressure ulcer -Noted on heel-Present on admission -Wound care consulted: antimicrobial nonadherent dressing (Xeroform) secured with dry gauze and a few turns with a Kerlix roll gauze/paper tape. The foot is to be placed into a pressure redistribution heel boot  Decreased oral intake along with questionable dysphagia -Nursing notes state that patient had some coughing after oral intake -Speech therapy consulted and recommended dysphagia 2 diet -Patient has had weight loss from 98kg to 76.7 kg since July 2021  -Previous hospitalist discussed with patient's wife regarding palliative care however they are not ready for this conversation at present time.   DVT Prophylaxis  Eliquis  Code Status: Full  Family Communication: None at bedside  Disposition Plan:  Status is: Inpatient  Remains inpatient appropriate because:IV treatments appropriate due to intensity of illness or inability to take PO   Dispo: The patient is  from: Home              Anticipated d/c is to: SNF              Anticipated d/c date is: 2 days              Patient  currently is not medically stable to d/c.  Consultants None  Procedures  None  Antibiotics   Anti-infectives (From admission, onward)   Start     Dose/Rate Route Frequency Ordered Stop   09/21/20 1800  cefTRIAXone (ROCEPHIN) 1 g in sodium chloride 0.9 % 100 mL IVPB        1 g 200 mL/hr over 30 Minutes Intravenous Every 24 hours 09/20/20 2148 09/24/20 2359   09/20/20 1845  cefTRIAXone (ROCEPHIN) 1 g in sodium chloride 0.9 % 100 mL IVPB        1 g 200 mL/hr over 30 Minutes Intravenous  Once 09/20/20 1841 09/20/20 2050      Subjective:   Ryan Macdonald seen and examined today.  Patient states he is feeling better as compared to when he first came to the hospital.  He denies chest pain or shortness of breath, abdominal pain, nausea or vomiting, diarrhea or constipation, dizziness or headache.  Feels that he can get up and help [place] clean the hospital. Objective:   Vitals:   09/23/20 1939 09/24/20 0300 09/24/20 0310 09/24/20 1109  BP: (!) 107/49  (!) 110/58 104/61  Pulse: 78  82 85  Resp: 20  20 16   Temp: 99.6 F (37.6 C)  99.2 F (37.3 C) 98.7 F (37.1 C)  TempSrc: Oral  Oral Oral  SpO2: 96%  98% 94%  Weight:  76.2 kg    Height:        Intake/Output Summary (Last 24 hours) at 09/24/2020 1426 Last data filed at 09/24/2020 1302 Gross per 24 hour  Intake 962.5 ml  Output 400 ml  Net 562.5 ml   Filed Weights   09/22/20 0550 09/23/20 0118 09/24/20 0300  Weight: 77.6 kg 76.7 kg 76.2 kg    Exam  General: Well developed, well nourished, NAD, appears stated age  HEENT: NCAT,  mucous membranes moist.   Cardiovascular: S1 S2 auscultated, irregular  Respiratory: Clear to auscultation bilaterally  Abdomen: Soft, nontender, nondistended, + bowel sounds  Extremities: warm dry without cyanosis clubbing or edema. Dressing on R ankle  Neuro: AAOx3, nonfocal  Psych: pleasant, appropriate mood and affect   Data Reviewed: I have personally reviewed following labs and  imaging studies  CBC: Recent Labs  Lab 09/20/20 1506 09/21/20 0429 09/22/20 1040 09/23/20 0231 09/24/20 0239  WBC 6.6 6.7 6.6 7.5 8.9  NEUTROABS 5.2  --   --   --   --   HGB 9.4* 9.2* 8.1* 8.3* 8.2*  HCT 29.9* 28.8* 25.1* 25.8* 25.7*  MCV 98.4 97.0 96.9 98.1 97.7  PLT 229 236 205 207 205   Basic Metabolic Panel: Recent Labs  Lab 09/20/20 1506 09/21/20 0429 09/22/20 1040 09/23/20 0231 09/24/20 0239  NA 138 140 144 146* 144  K 3.9 4.3 3.9 4.1 3.8  CL 103 104 111 114* 116*  CO2 23 25 25 24  21*  GLUCOSE 89 85 96 79 119*  BUN 55* 55* 50* 41* 32*  CREATININE 2.98* 2.96* 2.45* 2.04* 1.70*  CALCIUM 8.9 8.7* 8.5* 8.4* 8.6*   GFR: Estimated Creatinine Clearance: 30.2 mL/min (A) (by C-G formula based on SCr of 1.7 mg/dL (H)). Liver Function Tests: Recent  Labs  Lab 09/20/20 1506  AST 55*  ALT 22  ALKPHOS 85  BILITOT 1.4*  PROT 7.3  ALBUMIN 2.1*   No results for input(s): LIPASE, AMYLASE in the last 168 hours. No results for input(s): AMMONIA in the last 168 hours. Coagulation Profile: No results for input(s): INR, PROTIME in the last 168 hours. Cardiac Enzymes: No results for input(s): CKTOTAL, CKMB, CKMBINDEX, TROPONINI in the last 168 hours. BNP (last 3 results) No results for input(s): PROBNP in the last 8760 hours. HbA1C: No results for input(s): HGBA1C in the last 72 hours. CBG: Recent Labs  Lab 09/20/20 1443  GLUCAP 87   Lipid Profile: No results for input(s): CHOL, HDL, LDLCALC, TRIG, CHOLHDL, LDLDIRECT in the last 72 hours. Thyroid Function Tests: No results for input(s): TSH, T4TOTAL, FREET4, T3FREE, THYROIDAB in the last 72 hours. Anemia Panel: No results for input(s): VITAMINB12, FOLATE, FERRITIN, TIBC, IRON, RETICCTPCT in the last 72 hours. Urine analysis:    Component Value Date/Time   COLORURINE AMBER (A) 09/20/2020 1600   APPEARANCEUR CLOUDY (A) 09/20/2020 1600   LABSPEC 1.011 09/20/2020 1600   PHURINE 5.0 09/20/2020 1600   GLUCOSEU  NEGATIVE 09/20/2020 1600   HGBUR MODERATE (A) 09/20/2020 1600   BILIRUBINUR NEGATIVE 09/20/2020 1600   KETONESUR NEGATIVE 09/20/2020 1600   PROTEINUR 100 (A) 09/20/2020 1600   UROBILINOGEN 0.2 05/26/2015 1521   NITRITE NEGATIVE 09/20/2020 1600   LEUKOCYTESUR LARGE (A) 09/20/2020 1600   Sepsis Labs: @LABRCNTIP (procalcitonin:4,lacticidven:4)  ) Recent Results (from the past 240 hour(s))  Respiratory Panel by RT PCR (Flu A&B, Covid) - Nasopharyngeal Swab     Status: None   Collection Time: 09/20/20  8:06 PM   Specimen: Nasopharyngeal Swab  Result Value Ref Range Status   SARS Coronavirus 2 by RT PCR NEGATIVE NEGATIVE Final    Comment: (NOTE) SARS-CoV-2 target nucleic acids are NOT DETECTED.  The SARS-CoV-2 RNA is generally detectable in upper respiratoy specimens during the acute phase of infection. The lowest concentration of SARS-CoV-2 viral copies this assay can detect is 131 copies/mL. A negative result does not preclude SARS-Cov-2 infection and should not be used as the sole basis for treatment or other patient management decisions. A negative result may occur with  improper specimen collection/handling, submission of specimen other than nasopharyngeal swab, presence of viral mutation(s) within the areas targeted by this assay, and inadequate number of viral copies (<131 copies/mL). A negative result must be combined with clinical observations, patient history, and epidemiological information. The expected result is Negative.  Fact Sheet for Patients:  https://www.moore.com/  Fact Sheet for Healthcare Providers:  https://www.young.biz/  This test is no t yet approved or cleared by the [place] FDA and  has been authorized for detection and/or diagnosis of SARS-CoV-2 by FDA under an Emergency Use Authorization (EUA). This EUA will remain  in effect (meaning this test can be used) for the duration of the COVID-19 declaration  under Section 564(b)(1) of the Act, 21 U.S.C. section 360bbb-3(b)(1), unless the authorization is terminated or revoked sooner.     Influenza A by PCR NEGATIVE NEGATIVE Final   Influenza B by PCR NEGATIVE NEGATIVE Final    Comment: (NOTE) The Xpert Xpress SARS-CoV-2/FLU/RSV assay is intended as an aid in  the diagnosis of influenza from Nasopharyngeal swab specimens and  should not be used as a sole basis for treatment. Nasal washings and  aspirates are unacceptable for Xpert Xpress SARS-CoV-2/FLU/RSV  testing.  Fact Sheet for Patients: https://www.moore.com/  Fact Sheet for  Healthcare Providers: https://www.young.biz/  This test is not yet approved or cleared by the [place] and  has been authorized for detection and/or diagnosis of SARS-CoV-2 by  FDA under an Emergency Use Authorization (EUA). This EUA will remain  in effect (meaning this test can be used) for the duration of the  Covid-19 declaration under Section 564(b)(1) of the Act, 21  U.S.C. section 360bbb-3(b)(1), unless the authorization is  terminated or revoked. Performed at [facility], [address], [place], [place] [address]   MRSA PCR Screening     Status: None   Collection Time: 09/21/20  3:47 AM   Specimen: Nasal Mucosa; Nasopharyngeal  Result Value Ref Range Status   MRSA by PCR NEGATIVE NEGATIVE Final    Comment:        The GeneXpert MRSA Assay (FDA approved for NASAL specimens only), is one component of a comprehensive MRSA colonization surveillance program. It is not intended to diagnose MRSA infection nor to guide or monitor treatment for MRSA infections. Performed at [facility], [address], [place], [place] [address]   Culture, Urine     Status: None   Collection Time: 09/21/20 10:41 AM   Specimen: Urine, Random  Result Value Ref Range Status   Specimen Description URINE, RANDOM  Final   Special Requests NONE  Final    Culture   Final    NO GROWTH Performed at [facility], [address] [place], [place] [address]    Report Status 09/22/2020 FINAL  Final      Radiology Studies: No results found.   Scheduled Meds: . amiodarone  200 mg Oral Daily  . apixaban  2.5 mg Oral BID  . feeding supplement  237 mL Oral BID BM  . finasteride  5 mg Oral Daily  . levothyroxine  125 mcg Oral QAC breakfast  . magnesium oxide  400 mg Oral Daily  . multivitamin with minerals  1 tablet Oral Daily  . rosuvastatin  10 mg Oral q1800   Continuous Infusions: . sodium chloride Stopped (09/21/20 1742)  . cefTRIAXone (ROCEPHIN)  IV 1 g (09/23/20 1722)     LOS: 4 days   Time Spent in minutes   45 minutes  Justin Meisenheimer D.O. on 09/24/2020 at 2:26 PM  Between 7am to 7pm - Please see pager noted on [email or web address]  After 7pm go to [email or web address]  And look for the night coverage person covering for me after hours  [facility] Office  [phone]

## 2020-09-25 ENCOUNTER — Other Ambulatory Visit (HOSPITAL_COMMUNITY): Payer: Self-pay

## 2020-09-25 DIAGNOSIS — E440 Moderate protein-calorie malnutrition: Secondary | ICD-10-CM

## 2020-09-25 LAB — BASIC METABOLIC PANEL
Anion gap: 7 (ref 5–15)
BUN: 26 mg/dL — ABNORMAL HIGH (ref 8–23)
CO2: 21 mmol/L — ABNORMAL LOW (ref 22–32)
Calcium: 8.5 mg/dL — ABNORMAL LOW (ref 8.9–10.3)
Chloride: 116 mmol/L — ABNORMAL HIGH (ref 98–111)
Creatinine, Ser: 1.47 mg/dL — ABNORMAL HIGH (ref 0.61–1.24)
GFR, Estimated: 47 mL/min — ABNORMAL LOW (ref >60)
Glucose, Bld: 83 mg/dL (ref 70–99)
Potassium: 3.7 mmol/L (ref 3.5–5.1)
Sodium: 144 mmol/L (ref 135–145)

## 2020-09-25 LAB — HEMOGLOBIN AND HEMATOCRIT, BLOOD
HCT: 26.3 % — ABNORMAL LOW (ref 39.0–52.0)
Hemoglobin: 8.4 g/dL — ABNORMAL LOW (ref 13.0–17.0)

## 2020-09-25 MED ORDER — TORSEMIDE 20 MG PO TABS
60.0000 mg | ORAL_TABLET | Freq: Two times a day (BID) | ORAL | Status: DC
Start: 2020-09-25 — End: 2020-10-12

## 2020-09-25 MED ORDER — ADULT MULTIVITAMIN W/MINERALS CH
1.0000 | ORAL_TABLET | Freq: Every day | ORAL | Status: DC
Start: 2020-09-26 — End: 2020-11-15

## 2020-09-25 MED ORDER — ENSURE ENLIVE PO LIQD: 237.0000 mL | Freq: Two times a day (BID) | ORAL | Status: AC

## 2020-09-25 NOTE — Discharge Summary (Signed)
Physician Discharge Summary  Ryan Macdonald EAV:[id number] DOB: August 09, 1934 DOA: 09/20/2020  PCP: [facility]  Admit date: 09/20/2020 Discharge date: 09/25/2020  Time spent: 45 minutes  Recommendations for Outpatient Follow-up:  Patient will be discharged to skilled nursing facility.  Patient will need to follow up with primary care provider within one week of discharge, repeat BMP in 1 week.  Follow-up with cardiology/CHF clinic.  Patient should continue medications as prescribed.  Patient should follow a dysphagia 1 diet.   Discharge Diagnoses:  Urinary tract infection Acute kidney injury on chronic kidney disease, stage IIIb Essential hypertension/Chronic systolic heart failure Paroxysmal atrial fibrillation Hypothyroidism Hyperlipidemia Gout Pressure ulcer Decreased oral intake along with questionable dysphagia  Discharge Condition: Stable  Diet recommendation: Dysphagia 1  Filed Weights   09/23/20 0118 09/24/20 0300 09/25/20 0047  Weight: 76.7 kg 76.2 kg 75.3 kg    History of present illness:  09/20/2020 by Dr. Sharl Ma Singletonis a 86 y.o.malewith medical history significant ofCHF (LVEF 20 to 25%, status post AICD), paroxysmal A. fib, hypertension, CKD 3, hyperlipidemia, gout who presents following several days to a week of increased confusion, decreased appetite, and fatigue. Patient was admitted last month with sepsis due to UTI and has been at [facility] for about a month after this. Per his wife he is received multiple different antibiotics at [facility] to continue treatments of his UTI. He has continued to have a degree of confusion since his recent admission that she states was not there prior to that admission. His level of confusion does appear to bethe same as it has been while at [facility]. He has continued to have dark urine throughout his rehab stay. They deny any fevers, chest pain, shortness of breath,  vomiting, constipation, diarrhea, abdominal pain.   Hospital Course:  Urinary tract infection -Urinalysis consistent with UTI.  Urine culture showed no growth however this culture appears to have been sent after IV antibiotics were started so question the accuracy. -Patient was previously admitted with sepsis secondary to UTI only 1 month ago. -Completed 5-day course of ceftriaxone during hospitalization -Patient with hematuria likely related to infection.  Last few UAs noted to have blood.  CT scan from 2020 showed atrophic right kidney with multiple nonobstructive calculi measuring up to 2.9 cm.  Patient is not having any flank pain at this time.  Acute kidney injury on chronic kidney disease, stage IIIb -Patient is on chronic diuretic therapy given CHF.  He has also had decreased oral intake. -Suspect AKI is secondary to medications along with poor oral intake. -Was placed on IV fluids however were discontinued on 1026 given history of heart failure. -Have encouraged oral intake -Allopurinol has been held.  No longer on ciprofloxacin. -Renal ultrasound obtained showing medical renal disease -Creatinine was 2.98 on admission, down to 1.4 -Repeat BMP in 1 week  Essential hypertension/Chronic systolic heart failure -Echocardiogram September 21 showed an EF of 20-25%, AICD in place -Diuretics (torsemide, metolazone, spironolactone) currently held due to AKI -BP currently stable -Patient appears to be euvolemic and compensated -Monitor intake and output, daily weights  -Discussed medications with Dr. Shirlee Latch via phone, recommended decreasing torsemide to 60 mg twice daily, holding metolazone and continuing spironolactone.  Follow-up in the clinic.  Paroxysmal atrial fibrillation -Continue amiodarone, Eliquis  Hypothyroidism -Continue Synthroid  Hyperlipidemia -Continue statin  Gout -Allopurinol currently held given AKI-resume on discharge  Pressure ulcer -Noted on  heel-Present on admission -Wound care consulted: antimicrobial nonadherent dressing (  Xeroform) secured with dry gauze and a few turns with a Kerlix roll gauze/paper tape. The foot is to be placed into a pressure redistribution heel boot  Moderate malnutrition with questionable dysphagia -Nursing notes state that patient had some coughing after oral intake -Speech therapy consulted and recommended dysphagia 2 diet -Patient has had weight loss from 98kg to 76.7 kg since July 2021  -Previous hospitalist discussed with patient's wife regarding palliative care however they are not ready for this conversation at present time.  -Nutrition consulted, continue supplements  Procedures: None  Consultations: None  Discharge Exam: Vitals:   09/24/20 1941 09/25/20 0408  BP: (!) 125/114 (!) 107/53  Pulse: 87 73  Resp: 20 20  Temp: 98.4 F (36.9 C) 98 F (36.7 C)  SpO2: 100% 97%   Exam  General: Well developed, well nourished, NAD, appears stated age  HEENT: NCAT,  mucous membranes moist.   Cardiovascular: S1 S2 auscultated, irregular  Respiratory: Clear to auscultation bilaterally  Abdomen: Soft, nontender, nondistended, + bowel sounds  Extremities: warm dry without cyanosis clubbing or edema. Dressing on R ankle  Neuro: AAOx3, nonfocal  Psych: pleasant, appropriate mood and affect  Discharge Instructions Discharge Instructions    Discharge instructions   Complete by: As directed    Patient will be discharged to skilled nursing facility.  Patient will need to follow up with primary care provider within one week of discharge, repeat BMP in 1 week.  Patient should continue medications as prescribed.  Patient should follow a dysphagia 1 diet.   Discharge wound care:   Complete by: As directed    Wound care to left posterior heel:  Cleanse with NS, pat dry. Cover with xeroform gauze [employer] # [id number]), top with dry gauze and secure with a few turns of kerlix roll gauze/paper tape.  Change daily. Place foot into [employer].     Allergies as of 09/25/2020      Reactions   Lisinopril Swelling   Facial swelling   Xarelto [rivaroxaban] Other (See Comments)   "Blood came out of my penis"      Medication List    STOP taking these medications   metolazone 2.5 MG tablet Commonly known as: ZAROXOLYN     TAKE these medications   allopurinol 100 MG tablet Commonly known as: ZYLOPRIM Take 200 mg by mouth daily.   amiodarone 200 MG tablet Commonly known as: PACERONE Take 1 tablet (200 mg total) by mouth daily.   apixaban 2.5 MG Tabs tablet Commonly known as: ELIQUIS Take 1 tablet (2.5 mg total) by mouth 2 (two) times daily.   benzonatate 100 MG capsule Commonly known as: TESSALON Take 1 capsule (100 mg total) by mouth 3 (three) times daily as needed for cough.   cyanocobalamin 1000 MCG tablet Take 1 tablet (1,000 mcg total) by mouth daily.   diclofenac Sodium 1 % Gel Commonly known as: VOLTAREN Apply 4 g topically 4 (four) times daily. What changed:   when to take this  reasons to take this   feeding supplement Liqd Take 237 mLs by mouth 2 (two) times daily between meals.   finasteride 5 MG tablet Commonly known as: PROSCAR Take 1 tablet (5 mg total) by mouth daily. What changed: when to take this   HYDROcodone-acetaminophen 5-325 MG tablet Commonly known as: NORCO/VICODIN Take 1 tablet by mouth 3 (three) times daily as needed for severe pain.   ipratropium-albuterol 0.5-2.5 (3) MG/3ML Soln Commonly known as: DUONEB Take 3 mLs by nebulization every  4 (four) hours as needed.   iron polysaccharides 150 MG capsule Commonly known as: NIFEREX Take 1 capsule (150 mg total) by mouth daily.   levothyroxine 125 MCG tablet Commonly known as: SYNTHROID Take 1 tablet (125 mcg total) by mouth daily before breakfast.   lidocaine 5 % Commonly known as: LIDODERM Place 1 patch onto the skin daily. Remove & Discard patch within 12 hours or as  directed by MD   magnesium oxide 400 MG tablet Commonly known as: MAG-OX Take 400 mg by mouth daily.   multivitamin with minerals Tabs tablet Take 1 tablet by mouth daily. Start taking on: September 26, 2020   polyethylene glycol 17 g packet Commonly known as: MIRALAX / GLYCOLAX Take 17 g by mouth 2 (two) times daily.   rosuvastatin 10 MG tablet Commonly known as: CRESTOR Take 1 tablet (10 mg total) by mouth daily at 6 PM.   saccharomyces boulardii 250 MG capsule Commonly known as: FLORASTOR Take 1 capsule (250 mg total) by mouth 2 (two) times daily.   senna-docusate 8.6-50 MG tablet Commonly known as: Senokot-S Take 1 tablet by mouth 2 (two) times daily.   sertraline 25 MG tablet Commonly known as: ZOLOFT Take 25 mg by mouth daily.   spironolactone 25 MG tablet Commonly known as: ALDACTONE Take 12.5 mg by mouth at bedtime.   torsemide 20 MG tablet Commonly known as: DEMADEX Take 3 tablets (60 mg total) by mouth 2 (two) times daily. What changed: how much to take            Discharge Care Instructions  (From admission, onward)         Start     Ordered   09/25/20 0000  Discharge wound care:       Comments: Wound care to left posterior heel:  Cleanse with NS, pat dry. Cover with xeroform gauze [employer] # [id number]), top with dry gauze and secure with a few turns of kerlix roll gauze/paper tape. Change daily. Place foot into [employer].   09/25/20 1259         Allergies  Allergen Reactions  . Lisinopril Swelling    Facial swelling  . Xarelto [Rivaroxaban] Other (See Comments)    "Blood came out of my penis"    Follow-up Information    [facility]. Schedule an appointment as soon as possible for a visit in 1 week(s).   Specialty: General Practice Why: Hospital follow up Contact information: [address] [place] [address] [phone]        [name], MD .   Specialty: Cardiology Contact information: [address] [place]  [address] [place] [address] [phone]        Laurey Morale, MD .   Specialty: Cardiology Contact information: [address] [place] [address] [phone]                The results of significant diagnostics from this hospitalization (including imaging, microbiology, ancillary and laboratory) are listed below for reference.    Significant Diagnostic Studies: DG Chest 2 View  Result Date: 09/20/2020 CLINICAL DATA:  Altered mental status EXAM: CHEST - 2 VIEW COMPARISON:  08/16/2020 FINDINGS: LEFT-sided pacemaker overlies normal cardiac silhouette. Small RIGHT effusion. No pulmonary edema. No infiltrate. No pneumothorax. IMPRESSION: Small RIGHT effusion.  No acute cardiopulmonary findings otherwise. Electronically Signed   By: Genevive Bi M.D.   On: 09/20/2020 15:41   CT Head Wo Contrast  Result Date: 09/20/2020 CLINICAL DATA:  Altered  mental status for a month. EXAM: CT HEAD WITHOUT CONTRAST TECHNIQUE: Contiguous axial images were obtained from the base of the skull through the vertex without intravenous contrast. COMPARISON:  08/16/2020 FINDINGS: Brain: No evidence of acute infarction, hemorrhage, hydrocephalus, extra-axial collection or mass lesion/mass effect. Diffuse cerebral atrophy. Ventricular dilatation is consistent with central atrophy. Patchy low-attenuation changes in the deep white matter consistent with small vessel ischemia. Vascular: Moderate intracranial arterial vascular calcifications. Skull: Calvarium appears intact. Sinuses/Orbits: Paranasal sinuses and mastoid air cells are clear. Other: No significant change since previous study. IMPRESSION: 1. No acute intracranial abnormalities. 2. Chronic atrophy and small vessel ischemia. Electronically Signed   By: Burman Nieves M.D.   On: 09/20/2020 19:27   US RENAL  Result Date: 09/21/2020 CLINICAL DATA:  Acute kidney injury EXAM: RENAL / URINARY TRACT ULTRASOUND COMPLETE COMPARISON:  None. FINDINGS:  Right Kidney: Renal measurements: 6.7 x 3.9 x 4.3 cm = volume: 59 mL. Slightly increased echogenicity is seen. There is an anechoic cyst seen within the lower pole measuring 0.9 x 0.9 x 0.9 cm. There are shadowing calculi seen within the midpole measuring up to 1.7 cm in transverse dimension. No hydronephrosis. Left Kidney: Renal measurements: 11.9 x 6.4 x 5.6 cm = volume: 226 mL. Increased echogenicity seen throughout. There are 2 anechoic cysts the largest measuring 2.1 x 1.7 x 2.2 cm. There are shadowing calculi measuring 6 mm seen within the midpole. No hydronephrosis. Bladder: Layering debris seen within the bladder. Other: None. IMPRESSION: Nonobstructing bilateral renal calculi. Diffusely increased parenchymal echogenicity, consistent with medical renal disease. Electronically Signed   By: Jonna Clark M.D.   On: 09/21/2020 00:15   CUP PACEART REMOTE DEVICE CHECK  Result Date: 09/20/2020 Scheduled remote reviewed. Normal device function.  Next remote 91 days.   Microbiology: Recent Results (from the past 240 hour(s))  Respiratory Panel by RT PCR (Flu A&B, Covid) - Nasopharyngeal Swab     Status: None   Collection Time: 09/20/20  8:06 PM   Specimen: Nasopharyngeal Swab  Result Value Ref Range Status   SARS Coronavirus 2 by RT PCR NEGATIVE NEGATIVE Final    Comment: (NOTE) SARS-CoV-2 target nucleic acids are NOT DETECTED.  The SARS-CoV-2 RNA is generally detectable in upper respiratoy specimens during the acute phase of infection. The lowest concentration of SARS-CoV-2 viral copies this assay can detect is 131 copies/mL. A negative result does not preclude SARS-Cov-2 infection and should not be used as the sole basis for treatment or other patient management decisions. A negative result may occur with  improper specimen collection/handling, submission of specimen other than nasopharyngeal swab, presence of viral mutation(s) within the areas targeted by this assay, and inadequate number  of viral copies (<131 copies/mL). A negative result must be combined with clinical observations, patient history, and epidemiological information. The expected result is Negative.  Fact Sheet for Patients:  https://www.moore.com/  Fact Sheet for Healthcare Providers:  https://www.young.biz/  This test is no t yet approved or cleared by the [place] FDA and  has been authorized for detection and/or diagnosis of SARS-CoV-2 by FDA under an Emergency Use Authorization (EUA). This EUA will remain  in effect (meaning this test can be used) for the duration of the COVID-19 declaration under Section 564(b)(1) of the Act, 21 U.S.C. section 360bbb-3(b)(1), unless the authorization is terminated or revoked sooner.     Influenza A by PCR NEGATIVE NEGATIVE Final   Influenza B by PCR NEGATIVE NEGATIVE Final    Comment: (NOTE) The  Xpert Xpress SARS-CoV-2/FLU/RSV assay is intended as an aid in  the diagnosis of influenza from Nasopharyngeal swab specimens and  should not be used as a sole basis for treatment. Nasal washings and  aspirates are unacceptable for Xpert Xpress SARS-CoV-2/FLU/RSV  testing.  Fact Sheet for Patients: https://www.moore.com/  Fact Sheet for Healthcare Providers: https://www.young.biz/  This test is not yet approved or cleared by the [place] FDA and  has been authorized for detection and/or diagnosis of SARS-CoV-2 by  FDA under an Emergency Use Authorization (EUA). This EUA will remain  in effect (meaning this test can be used) for the duration of the  Covid-19 declaration under Section 564(b)(1) of the Act, 21  U.S.C. section 360bbb-3(b)(1), unless the authorization is  terminated or revoked. Performed at [facility], [address] [place], [place] [address]   MRSA PCR Screening     Status: None   Collection Time: 09/21/20  3:47 AM   Specimen: Nasal Mucosa;  Nasopharyngeal  Result Value Ref Range Status   MRSA by PCR NEGATIVE NEGATIVE Final    Comment:        The GeneXpert MRSA Assay (FDA approved for NASAL specimens only), is one component of a comprehensive MRSA colonization surveillance program. It is not intended to diagnose MRSA infection nor to guide or monitor treatment for MRSA infections. Performed at [facility], [address] [place], [place] [address]   Culture, Urine     Status: None   Collection Time: 09/21/20 10:41 AM   Specimen: Urine, Random  Result Value Ref Range Status   Specimen Description URINE, RANDOM  Final   Special Requests NONE  Final   Culture   Final    NO GROWTH Performed at [facility], [address] [place], [place] [address]    Report Status 09/22/2020 FINAL  Final  SARS Coronavirus 2 by RT PCR (hospital order, performed in [facility] hospital lab) Nasopharyngeal Nasopharyngeal Swab     Status: None   Collection Time: 09/24/20  8:35 PM   Specimen: Nasopharyngeal Swab  Result Value Ref Range Status   SARS Coronavirus 2 NEGATIVE NEGATIVE Final    Comment: (NOTE) SARS-CoV-2 target nucleic acids are NOT DETECTED.  The SARS-CoV-2 RNA is generally detectable in upper and lower respiratory specimens during the acute phase of infection. The lowest concentration of SARS-CoV-2 viral copies this assay can detect is 250 copies / mL. A negative result does not preclude SARS-CoV-2 infection and should not be used as the sole basis for treatment or other patient management decisions.  A negative result may occur with improper specimen collection / handling, submission of specimen other than nasopharyngeal swab, presence of viral mutation(s) within the areas targeted by this assay, and inadequate number of viral copies (<250 copies / mL). A negative result must be combined with clinical observations, patient history, and epidemiological information.  Fact Sheet for Patients:     BoilerBrush.com.cy  Fact Sheet for Healthcare Providers: https://pope.com/  This test is not yet approved or  cleared by the [place] FDA and has been authorized for detection and/or diagnosis of SARS-CoV-2 by FDA under an Emergency Use Authorization (EUA).  This EUA will remain in effect (meaning this test can be used) for the duration of the COVID-19 declaration under Section 564(b)(1) of the Act, 21 U.S.C. section 360bbb-3(b)(1), unless the authorization is terminated or revoked sooner.  Performed at [facility], [address], [place], [place] [address]  Labs: Basic Metabolic Panel: Recent Labs  Lab 09/21/20 0429 09/22/20 1040 09/23/20 0231 09/24/20 0239 09/25/20 0241  NA 140 144 146* 144 144  K 4.3 3.9 4.1 3.8 3.7  CL 104 111 114* 116* 116*  CO2 25 25 24  21* 21*  GLUCOSE 85 96 79 119* 83  BUN 55* 50* 41* 32* 26*  CREATININE 2.96* 2.45* 2.04* 1.70* 1.47*  CALCIUM 8.7* 8.5* 8.4* 8.6* 8.5*   Liver Function Tests: Recent Labs  Lab 09/20/20 1506  AST 55*  ALT 22  ALKPHOS 85  BILITOT 1.4*  PROT 7.3  ALBUMIN 2.1*   No results for input(s): LIPASE, AMYLASE in the last 168 hours. No results for input(s): AMMONIA in the last 168 hours. CBC: Recent Labs  Lab 09/20/20 1506 09/20/20 1506 09/21/20 0429 09/22/20 1040 09/23/20 0231 09/24/20 0239 09/25/20 0241  WBC 6.6  --  6.7 6.6 7.5 8.9  --   NEUTROABS 5.2  --   --   --   --   --   --   HGB 9.4*   < > 9.2* 8.1* 8.3* 8.2* 8.4*  HCT 29.9*   < > 28.8* 25.1* 25.8* 25.7* 26.3*  MCV 98.4  --  97.0 96.9 98.1 97.7  --   PLT 229  --  236 205 207 205  --    < > = values in this interval not displayed.   Cardiac Enzymes: No results for input(s): CKTOTAL, CKMB, CKMBINDEX, TROPONINI in the last 168 hours. BNP: BNP (last 3 results) Recent Labs    02/11/20 1739 08/17/20 0716  BNP 1,569.0* 2,349.4*    ProBNP (last 3 results) No results for  input(s): PROBNP in the last 8760 hours.  CBG: Recent Labs  Lab 09/20/20 1443 09/24/20 1643  GLUCAP 87 99       Signed:  Chrysa Rampy  [facility] 09/25/2020, 12:59 PM

## 2020-09-25 NOTE — Progress Notes (Signed)
Occupational Therapy Treatment Patient Details Name: Ryan Macdonald MRN: [id number] DOB: [birth year] Today's Date: 09/25/2020    History of present illness 86yo male presenting with increased confusion, reduced appetite, and fatigue; note recent admit about a month ago for septic UTI after which he was discharged to [facility] for rehab. Now admitted with UTI. PMH CHF s/p AICD, A-fib, HTN, CKD, HLD, gout, cardiomyopathy, DM, cardiac cath, THA, CSI   OT comments  Pt received in bed with his spouse present. Pt currently requires modA for bed mobility. He required minA for stability while sitting EOB to complete grooming task. Pt demonstrates poor activity tolerance, sitting EOB for about before requesting to return to bed. Pt's wife expressed concern regarding d/c placement at SNF and is concerned with caring for pt if he has to return home. Should pt d/c home he will need ambulance transport into the home, hospital bed, hoyer lift, BSC, HHOT and 24/7 physical assistance. Will continue to follow acutely and progress as tolerated.    Follow Up Recommendations  SNF    Equipment Recommendations  Other (comment) (TBD)    Recommendations for Other Services      Precautions / Restrictions Precautions Precautions: Fall;Other (comment);ICD/Pacemaker Precaution Comments: considerable L heel wound Restrictions Other Position/Activity Restrictions: unsure if there are any WB restrictions L LE given extent of heel ulcer       Mobility Bed Mobility Overal bed mobility: Needs Assistance Bed Mobility: Supine to Sit;Sit to Supine     Supine to sit: Mod assist;HOB elevated Sit to supine: Mod assist   General bed mobility comments: pt initiating progression of BLE to EOB, required heavy modA to progress trunk to upright position;modA for BLE management with return to supine  Transfers                 General transfer comment: deferred    Balance Overall balance  assessment: Needs assistance   Sitting balance-Leahy Scale: Poor Sitting balance - Comments: reliant on at least single UE support in sitting and required minA from therapist for support Postural control: Right lateral lean     Standing balance comment: did not attempt secondary to pt increased fatigue                           ADL either performed or assessed with clinical judgement   ADL Overall ADL's : Needs assistance/impaired     Grooming: Sitting;Minimal assistance;Wash/dry face Grooming Details (indicate cue type and reason): minA for stability while pt washed face sitting EOB                               General ADL Comments: pt limited by decreased activity tolerance, able to tolerate sitting EOB for about 10 minutes before requesting to return to supine     Vision       Perception     Praxis      Cognition Arousal/Alertness: Awake/alert Behavior During Therapy: Flat affect Overall Cognitive Status: Impaired/Different from baseline Area of Impairment: Orientation                 Orientation Level: Disoriented to;Place;Time;Situation Current Attention Level: Sustained Memory: Decreased short-term memory Following Commands: Follows one step commands consistently;Follows one step commands with increased time Safety/Judgement: Decreased awareness of deficits;Decreased awareness of safety Awareness: Intellectual Problem Solving: Slow processing;Decreased initiation;Difficulty sequencing;Requires verbal cues;Requires tactile cues General Comments: pt  demonstrating improvement with following commands, pt oriented to wife, did not appear to be oriented to situation;pt with decreased safety awareness, but demonstrated good self awareness communicating to OT that he was getting tired;pt flat affect throughout session        Exercises Exercises: General Lower Extremity General Exercises - Lower Extremity Ankle Circles/Pumps: AROM;Both;10  reps;Seated Quad Sets: AROM;Both;10 reps;Seated   Shoulder Instructions       General Comments pt's wife present during session, reports concern that pt will not be able to d/c to SNF, worried about pt returning home and if she will be able to care for him     Pertinent Vitals/ Pain       Pain Assessment: No/denies pain  Home Living                                          Prior Functioning/Environment              Frequency  Min 2X/week        Progress Toward Goals  OT Goals(current goals can now be found in the care plan section)  Progress towards OT goals: Progressing toward goals  Acute Rehab OT Goals Patient Stated Goal: to go home OT Goal Formulation: With patient Time For Goal Achievement: 10/07/20 Potential to Achieve Goals: Fair ADL Goals Pt Will Perform Grooming: with modified independence;sitting Pt Will Perform Upper Body Bathing: with modified independence;sitting Pt Will Transfer to Toilet: with mod assist;stand pivot transfer Additional ADL Goal #1: Pt will complete bed mobility with moderate assistance in preparation for ADL.  Plan Discharge plan remains appropriate    Co-evaluation                 AM-PAC OT "6 Clicks" Daily Activity     Outcome Measure   Help from another person eating meals?: A Little Help from another person taking care of personal grooming?: A Little Help from another person toileting, which includes using toliet, bedpan, or urinal?: Total Help from another person bathing (including washing, rinsing, drying)?: A Lot Help from another person to put on and taking off regular upper body clothing?: A Lot Help from another person to put on and taking off regular lower body clothing?: Total 6 Click Score: 12    End of Session    OT Visit Diagnosis: Other abnormalities of gait and mobility (R26.89);Unsteadiness on feet (R26.81);Muscle weakness (generalized) (M62.81);Other symptoms and signs involving  cognitive function   Activity Tolerance Patient limited by fatigue   Patient Left in bed;with call bell/phone within reach;with bed alarm set;with family/visitor present   Nurse Communication Mobility status        Time: 1422-1436 OT Time Calculation (min): 14 min  Charges: OT General Charges $OT Visit: 1 Visit OT Treatments $Self Care/Home Management : 8-22 mins  Rosey Bath OTR/L Acute Rehabilitation Services Office: [phone]    Rebeca Alert 09/25/2020, 3:31 PM

## 2020-09-25 NOTE — TOC Initial Note (Signed)
Transition of Care [facility]) - Initial/Assessment Note    Patient Details  Name: Ryan Macdonald MRN: [id number] Date of Birth: May 09, 1934  Transition of Care [facility]) CM/SW Contact:    Leone Haven, RN Phone Number: 09/25/2020, 3:41 PM  Clinical Narrative:                 NCM received information from CSW that patient wife will be taking patient home now with [facility] services.  NCM contacted wife they are active with [facility] and they would like to continue with them for HHRN, HHPT, HHOT.  [place] with [facility] notified he is for dc today.  Soc will begin 24 to 48 hrs post dc.  Per CSW patient will need ambulance transport, NCM confirmed address with wife. PTAR called.  NCM contacted staff Alycia Rossetti , patient is ready for transport.   Expected Discharge Plan: Skilled Nursing Facility Barriers to Discharge: No Barriers Identified   Patient Goals and CMS Choice Patient states their goals for this hospitalization and ongoing recovery are:: home with [facility] CMS Medicare.gov Compare Post Acute Care list provided to:: Patient Represenative (must comment) Choice offered to / list presented to : Spouse  Expected Discharge Plan and Services Expected Discharge Plan: Skilled Nursing Facility   Discharge Planning Services: CM Consult Post Acute Care Choice: Home Health, Resumption of Svcs/PTA Provider Living arrangements for the past 2 months: Single Family Home Expected Discharge Date: 09/25/20                 DME Agency: NA       HH Arranged: PT, OT, RN HH Agency: [facility] Date HH Agency Contacted: 09/25/20 Time HH Agency Contacted: [phone] Representative spoke with at [facility] Agency: [place]  Prior Living Arrangements/Services Living arrangements for the past 2 months: Single Family Home Lives with:: Spouse Patient language and need for interpreter reviewed:: Yes Do you feel safe going back to the place where you live?: Yes      Need for Family Participation in Patient Care: Yes  (Comment) Care giver support system in place?: Yes (comment)   Criminal Activity/Legal Involvement Pertinent to Current Situation/Hospitalization: No - Comment as needed  Activities of Daily Living Home Assistive Devices/Equipment: Wheelchair ADL Screening (condition at time of admission) Patient's cognitive ability adequate to safely complete daily activities?: No Is the patient deaf or have difficulty hearing?: No Does the patient have difficulty seeing, even when wearing glasses/contacts?: No Does the patient have difficulty concentrating, remembering, or making decisions?: Yes Patient able to express need for assistance with ADLs?: Yes Does the patient have difficulty dressing or bathing?: Yes Independently performs ADLs?: No Communication: Independent Dressing (OT): Needs assistance Is this a change from baseline?: Pre-admission baseline Grooming: Needs assistance Is this a change from baseline?: Pre-admission baseline Does the patient have difficulty walking or climbing stairs?: Yes Weakness of Legs: Both Weakness of Arms/Hands: Both  Permission Sought/Granted                  Emotional Assessment       Orientation: : Oriented to Self, Oriented to Place, Oriented to  Time, Oriented to Situation Alcohol / Substance Use: Not Applicable Psych Involvement: No (comment)  Admission diagnosis:  UTI (urinary tract infection) [N39.0] AKI (acute kidney injury) (HCC) [N17.9] Urinary tract infection with hematuria, site unspecified [N39.0, R31.9] Patient Active Problem List   Diagnosis Date Noted  . Malnutrition of moderate degree 09/23/2020  . Sepsis (HCC) 08/16/2020  . Hematuria 02/15/2020  . Constipation  02/13/2020  . Anemia of chronic disease 02/13/2020  . Secondary hypercoagulable state (HCC) 01/16/2020  . Spasm of muscle of lower back 03/14/2019  . Acute renal failure superimposed on stage 3 chronic kidney disease (HCC) 03/09/2019  . Transaminitis   . Pulmonary  emphysema (HCC)   . Chronic respiratory failure with hypoxia (HCC)   . Morbid obesity (HCC) 09/09/2018  . URI (upper respiratory infection) 08/31/2018  . UTI (urinary tract infection) 08/31/2018  . CHF (congestive heart failure) (HCC) 08/14/2018  . First degree AV block   . Shortness of breath 04/29/2018  . Renal insufficiency   . Left knee pain 07/08/2017  . Elevated troponin 04/14/2017  . Lactic acidosis   . Diabetes mellitus type 2 in obese (HCC) 02/03/2017  . Lobar pneumonia (HCC)   . Diabetes mellitus with complication (HCC)   . Depression 05/19/2016  . Gout 05/19/2016  . 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 NSVT (nonsustained ventricular tachycardia) (HCC)   . Chronic renal insufficiency, stage 3 (moderate) (HCC)   . Hyperkalemia 08/21/2015  . Pulmonary embolism (HCC)   . Osteoarthritis of left hip 10/25/2013  . PAF (paroxysmal atrial fibrillation) (HCC) 10/25/2013  . S/P hip replacement 10/24/2013  . Nonischemic cardiomyopathy (HCC)   . Obesity (BMI 30-39.9)   . Essential hypertension   . Hyperlipidemia    PCP:  [facility]:   [facility] - [place], [place] - [address] [place] [address] Phone: [phone] Fax: [phone]  [id number] [place], [place] - [address] AT [facility] OF [address] [place] [address] Phone: [phone] Fax: [phone]  [id number] - [place], [place] - [address] AT [facility] [address] [place] [address] Phone: [phone] Fax: [phone]  [facility] - [place], [place] - [address] [facility] [place] [address] Phone: [phone] Fax: [phone]     Social Determinants of Health (SDOH) Interventions    Readmission Risk Interventions Readmission Risk Prevention Plan 09/25/2020   Transportation Screening Complete  HRI or Home Care Consult Complete  Social Work Consult for Recovery Care Planning/Counseling Complete  Palliative Care Screening Not Applicable  Medication Review Oceanographer) Complete  Some recent data might be hidden

## 2020-09-25 NOTE — Progress Notes (Signed)
CSW met with pt and spouse at bedside to discuss dc plans. Pt stated he came from [facility] but was unsure of whether or not he wanted to return. CSW reached out to [facility] to confirm if they could take pt today and was told that pt actually had a balance of $1900 and couldn't come back unless that amount was paid. CSW reached out to SNF earlier in the week and was told nothing of the balance. CSW explained this to pt's spouse and spouse asked CSW if pt could use his VA benefits. CSW spoke with a VA CSW who advised that pt was not eligible for [place] SNF because pt is only 40% connected and has to be at least 70% connected to qualify. VA CSW further explained that pt may qualify for [facility] in [place], clinicals would need to be faxed and determination would be made. CSW presented this information to pt and spouse, spouse stated she would just take pt home. CSW asked spouse if there were any HH needs and spouse stated she needed a hospital bed but wasn't sure where it would be placed. Spouse stated they have previously received [place] services through [facility] and had an aide but at this point pt needs a nurse. CSW advised RNCM and MD of changes.

## 2020-09-25 NOTE — Progress Notes (Signed)
  Speech Language Pathology Treatment: Dysphagia  Patient Details Name: Ryan Macdonald MRN: [id number] DOB: 22-Aug-1934 Today's Date: 09/25/2020 Time: 6295-2841 SLP Time Calculation (min) (ACUTE ONLY): 14 min  Assessment / Plan / Recommendation Clinical Impression  Pt was seen for dysphagia treatment. He was alert and cooperative throughout the session without c/o pain. Pt exhibited prolonged mastication of over 5 minutes per spoon of eggs and ground meat and prolonged bolus formation with purees. Oral residue was noted with dysphagia 2 solids and coughing was demonstrated when a liquid wash was used to clear this residue. No other s/sx of aspiration were noted with solids or liquids. It is recommended that his diet be downgraded to dysphagia 1 solids and thin liquids. SLP will continue to follow pt.    HPI HPI: Pt is an 86 y.o. male with medical history significant of CHF (LVEF 20 to 25%, status post AICD), paroxysmal A. fib, hypertension, CKD 3, hyperlipidemia, gout who presented following several days to a week of increased confusion, decreased appetite, and fatigue. Patient was admitted last month with sepsis due to UTI and has been at [facility] for about a month after this. CT head was negative for acute changes. CXR 10/23: Small RIGHT effusion. No acute cardiopulmonary findings otherwise. Per RN's note on 10/26 "Pt had a hard time swallowing ,tendency to pocket food,noticed will cough on and off while eating"      SLP Plan  Continue with current plan of care       Recommendations  Diet recommendations: Thin liquid;Dysphagia 1 (puree) Liquids provided via: Cup;Straw Medication Administration: Crushed with puree Supervision: Staff to assist with self feeding Compensations: Slow rate;Small sips/bites;Follow solids with liquid Postural Changes and/or Swallow Maneuvers: Seated upright 90 degrees;Upright 30-60 min after meal                Oral Care Recommendations: Oral care  BID Follow up Recommendations:  (TBD) SLP Visit Diagnosis: Dysphagia, unspecified (R13.10) Plan: Continue with current plan of care       Kimani Hovis I. Vear Clock, MS, CCC-SLP Acute Rehabilitation Services Office number [phone] Pager [phone]                [name] 09/25/2020, 9:36 AM

## 2020-09-25 NOTE — Progress Notes (Signed)
Pt is being d/c today per wife. He is just waiting on txp to get home. I went ahead and came out to fill pill box per d/c instructions listed and will f/u on Monday.  He has f/u in clinic on 11/16.  Wife is concerned how he is going to be able to make it if he isnt walking well enough by then.   [name], EMT-Paramedic  09/25/20

## 2020-09-26 ENCOUNTER — Emergency Department (HOSPITAL_COMMUNITY): Payer: No Typology Code available for payment source

## 2020-09-26 ENCOUNTER — Inpatient Hospital Stay (HOSPITAL_COMMUNITY): Payer: No Typology Code available for payment source

## 2020-09-26 ENCOUNTER — Inpatient Hospital Stay (HOSPITAL_COMMUNITY)
Admission: EM | Admit: 2020-09-26 | Discharge: 2020-10-12 | DRG: 391 | Disposition: A | Discharge status: Skilled Nursing Facility | Payer: No Typology Code available for payment source | Attending: Internal Medicine | Admitting: Internal Medicine

## 2020-09-26 DIAGNOSIS — R748 Abnormal levels of other serum enzymes: Secondary | ICD-10-CM | POA: Diagnosis present

## 2020-09-26 DIAGNOSIS — M109 Gout, unspecified: Secondary | ICD-10-CM | POA: Diagnosis present

## 2020-09-26 DIAGNOSIS — E118 Type 2 diabetes mellitus with unspecified complications: Secondary | ICD-10-CM | POA: Diagnosis not present

## 2020-09-26 DIAGNOSIS — R1013 Epigastric pain: Secondary | ICD-10-CM | POA: Diagnosis not present

## 2020-09-26 DIAGNOSIS — E876 Hypokalemia: Secondary | ICD-10-CM | POA: Diagnosis not present

## 2020-09-26 DIAGNOSIS — I480 Paroxysmal atrial fibrillation: Secondary | ICD-10-CM | POA: Diagnosis present

## 2020-09-26 DIAGNOSIS — E11649 Type 2 diabetes mellitus with hypoglycemia without coma: Secondary | ICD-10-CM | POA: Diagnosis present

## 2020-09-26 DIAGNOSIS — E039 Hypothyroidism, unspecified: Secondary | ICD-10-CM | POA: Diagnosis present

## 2020-09-26 DIAGNOSIS — I959 Hypotension, unspecified: Secondary | ICD-10-CM | POA: Diagnosis not present

## 2020-09-26 DIAGNOSIS — E785 Hyperlipidemia, unspecified: Secondary | ICD-10-CM | POA: Diagnosis present

## 2020-09-26 DIAGNOSIS — J9611 Chronic respiratory failure with hypoxia: Secondary | ICD-10-CM | POA: Diagnosis present

## 2020-09-26 DIAGNOSIS — M1990 Unspecified osteoarthritis, unspecified site: Secondary | ICD-10-CM | POA: Diagnosis present

## 2020-09-26 DIAGNOSIS — I10 Essential (primary) hypertension: Secondary | ICD-10-CM | POA: Diagnosis present

## 2020-09-26 DIAGNOSIS — Z95810 Presence of automatic (implantable) cardiac defibrillator: Secondary | ICD-10-CM | POA: Diagnosis present

## 2020-09-26 DIAGNOSIS — E1169 Type 2 diabetes mellitus with other specified complication: Secondary | ICD-10-CM | POA: Diagnosis present

## 2020-09-26 DIAGNOSIS — N1832 Chronic kidney disease, stage 3b: Secondary | ICD-10-CM | POA: Diagnosis present

## 2020-09-26 DIAGNOSIS — Z6827 Body mass index (BMI) 27.0-27.9, adult: Secondary | ICD-10-CM

## 2020-09-26 DIAGNOSIS — N400 Enlarged prostate without lower urinary tract symptoms: Secondary | ICD-10-CM | POA: Diagnosis present

## 2020-09-26 DIAGNOSIS — E43 Unspecified severe protein-calorie malnutrition: Secondary | ICD-10-CM | POA: Diagnosis present

## 2020-09-26 DIAGNOSIS — E1122 Type 2 diabetes mellitus with diabetic chronic kidney disease: Secondary | ICD-10-CM | POA: Diagnosis present

## 2020-09-26 DIAGNOSIS — E669 Obesity, unspecified: Secondary | ICD-10-CM | POA: Diagnosis present

## 2020-09-26 DIAGNOSIS — E860 Dehydration: Secondary | ICD-10-CM | POA: Diagnosis present

## 2020-09-26 DIAGNOSIS — Z20822 Contact with and (suspected) exposure to covid-19: Secondary | ICD-10-CM | POA: Diagnosis present

## 2020-09-26 DIAGNOSIS — Z86711 Personal history of pulmonary embolism: Secondary | ICD-10-CM

## 2020-09-26 DIAGNOSIS — K8591 Acute pancreatitis with uninfected necrosis, unspecified: Secondary | ICD-10-CM

## 2020-09-26 DIAGNOSIS — N2889 Other specified disorders of kidney and ureter: Secondary | ICD-10-CM | POA: Diagnosis present

## 2020-09-26 DIAGNOSIS — Z96642 Presence of left artificial hip joint: Secondary | ICD-10-CM | POA: Diagnosis present

## 2020-09-26 DIAGNOSIS — I428 Other cardiomyopathies: Secondary | ICD-10-CM

## 2020-09-26 DIAGNOSIS — E7800 Pure hypercholesterolemia, unspecified: Secondary | ICD-10-CM | POA: Diagnosis not present

## 2020-09-26 DIAGNOSIS — D631 Anemia in chronic kidney disease: Secondary | ICD-10-CM

## 2020-09-26 DIAGNOSIS — M1000 Idiopathic gout, unspecified site: Secondary | ICD-10-CM | POA: Diagnosis not present

## 2020-09-26 DIAGNOSIS — Z87891 Personal history of nicotine dependence: Secondary | ICD-10-CM

## 2020-09-26 DIAGNOSIS — F32A Depression, unspecified: Secondary | ICD-10-CM | POA: Diagnosis present

## 2020-09-26 DIAGNOSIS — Z87442 Personal history of urinary calculi: Secondary | ICD-10-CM | POA: Diagnosis not present

## 2020-09-26 DIAGNOSIS — N179 Acute kidney failure, unspecified: Secondary | ICD-10-CM | POA: Diagnosis not present

## 2020-09-26 DIAGNOSIS — I5022 Chronic systolic (congestive) heart failure: Secondary | ICD-10-CM | POA: Diagnosis present

## 2020-09-26 DIAGNOSIS — S91301A Unspecified open wound, right foot, initial encounter: Secondary | ICD-10-CM | POA: Diagnosis not present

## 2020-09-26 DIAGNOSIS — E875 Hyperkalemia: Secondary | ICD-10-CM | POA: Diagnosis present

## 2020-09-26 DIAGNOSIS — G8929 Other chronic pain: Secondary | ICD-10-CM | POA: Diagnosis present

## 2020-09-26 DIAGNOSIS — R7401 Elevation of levels of liver transaminase levels: Secondary | ICD-10-CM | POA: Diagnosis present

## 2020-09-26 DIAGNOSIS — S91302A Unspecified open wound, left foot, initial encounter: Secondary | ICD-10-CM | POA: Diagnosis present

## 2020-09-26 DIAGNOSIS — K859 Acute pancreatitis without necrosis or infection, unspecified: Secondary | ICD-10-CM | POA: Diagnosis present

## 2020-09-26 DIAGNOSIS — I864 Gastric varices: Secondary | ICD-10-CM | POA: Diagnosis present

## 2020-09-26 DIAGNOSIS — L89623 Pressure ulcer of left heel, stage 3: Secondary | ICD-10-CM | POA: Diagnosis not present

## 2020-09-26 DIAGNOSIS — K8500 Idiopathic acute pancreatitis without necrosis or infection: Secondary | ICD-10-CM | POA: Diagnosis not present

## 2020-09-26 DIAGNOSIS — R7989 Other specified abnormal findings of blood chemistry: Secondary | ICD-10-CM | POA: Diagnosis present

## 2020-09-26 DIAGNOSIS — Z8249 Family history of ischemic heart disease and other diseases of the circulatory system: Secondary | ICD-10-CM

## 2020-09-26 DIAGNOSIS — N1831 Chronic kidney disease, stage 3a: Secondary | ICD-10-CM | POA: Diagnosis not present

## 2020-09-26 DIAGNOSIS — N183 Chronic kidney disease, stage 3 unspecified: Secondary | ICD-10-CM | POA: Diagnosis present

## 2020-09-26 DIAGNOSIS — L89626 Pressure-induced deep tissue damage of left heel: Secondary | ICD-10-CM | POA: Diagnosis present

## 2020-09-26 DIAGNOSIS — Z79899 Other long term (current) drug therapy: Secondary | ICD-10-CM | POA: Diagnosis not present

## 2020-09-26 DIAGNOSIS — Z9981 Dependence on supplemental oxygen: Secondary | ICD-10-CM

## 2020-09-26 DIAGNOSIS — Z87440 Personal history of urinary (tract) infections: Secondary | ICD-10-CM

## 2020-09-26 DIAGNOSIS — Z7901 Long term (current) use of anticoagulants: Secondary | ICD-10-CM

## 2020-09-26 DIAGNOSIS — R109 Unspecified abdominal pain: Secondary | ICD-10-CM | POA: Diagnosis not present

## 2020-09-26 DIAGNOSIS — D638 Anemia in other chronic diseases classified elsewhere: Secondary | ICD-10-CM | POA: Diagnosis not present

## 2020-09-26 DIAGNOSIS — K8020 Calculus of gallbladder without cholecystitis without obstruction: Secondary | ICD-10-CM

## 2020-09-26 DIAGNOSIS — E440 Moderate protein-calorie malnutrition: Secondary | ICD-10-CM | POA: Diagnosis present

## 2020-09-26 DIAGNOSIS — I130 Hypertensive heart and chronic kidney disease with heart failure and stage 1 through stage 4 chronic kidney disease, or unspecified chronic kidney disease: Secondary | ICD-10-CM | POA: Diagnosis present

## 2020-09-26 DIAGNOSIS — R110 Nausea: Secondary | ICD-10-CM | POA: Diagnosis present

## 2020-09-26 DIAGNOSIS — K8050 Calculus of bile duct without cholangitis or cholecystitis without obstruction: Secondary | ICD-10-CM

## 2020-09-26 DIAGNOSIS — Z955 Presence of coronary angioplasty implant and graft: Secondary | ICD-10-CM

## 2020-09-26 LAB — URINALYSIS, ROUTINE W REFLEX MICROSCOPIC

## 2020-09-26 LAB — LIPASE, BLOOD: Lipase: 116 U/L — ABNORMAL HIGH (ref 11–51)

## 2020-09-26 LAB — COMPREHENSIVE METABOLIC PANEL
ALT: 31 U/L (ref 0–44)
AST: 138 U/L — ABNORMAL HIGH (ref 15–41)
Albumin: 1.9 g/dL — ABNORMAL LOW (ref 3.5–5.0)
Alkaline Phosphatase: 104 U/L (ref 38–126)
Anion gap: 7 (ref 5–15)
BUN: 25 mg/dL — ABNORMAL HIGH (ref 8–23)
CO2: 21 mmol/L — ABNORMAL LOW (ref 22–32)
Calcium: 8.7 mg/dL — ABNORMAL LOW (ref 8.9–10.3)
Chloride: 115 mmol/L — ABNORMAL HIGH (ref 98–111)
Creatinine, Ser: 1.41 mg/dL — ABNORMAL HIGH (ref 0.61–1.24)
GFR, Estimated: 49 mL/min — ABNORMAL LOW (ref >60)
Glucose, Bld: 112 mg/dL — ABNORMAL HIGH (ref 70–99)
Potassium: 5.4 mmol/L — ABNORMAL HIGH (ref 3.5–5.1)
Sodium: 143 mmol/L (ref 135–145)
Total Bilirubin: 2.1 mg/dL — ABNORMAL HIGH (ref 0.3–1.2)
Total Protein: 7 g/dL (ref 6.5–8.1)

## 2020-09-26 LAB — CBC WITH DIFFERENTIAL/PLATELET
Abs Immature Granulocytes: 0.08 10*3/uL — ABNORMAL HIGH (ref 0.00–0.07)
Basophils Absolute: 0 10*3/uL (ref 0.0–0.1)
Basophils Relative: 0 %
Eosinophils Absolute: 0.1 10*3/uL (ref 0.0–0.5)
Eosinophils Relative: 1 %
HCT: 28.8 % — ABNORMAL LOW (ref 39.0–52.0)
Hemoglobin: 9.1 g/dL — ABNORMAL LOW (ref 13.0–17.0)
Immature Granulocytes: 1 %
Lymphocytes Relative: 14 %
Lymphs Abs: 1.7 10*3/uL (ref 0.7–4.0)
MCH: 31.5 pg (ref 26.0–34.0)
MCHC: 31.6 g/dL (ref 30.0–36.0)
MCV: 99.7 fL (ref 80.0–100.0)
Monocytes Absolute: 1 10*3/uL (ref 0.1–1.0)
Monocytes Relative: 8 %
Neutro Abs: 9.3 10*3/uL — ABNORMAL HIGH (ref 1.7–7.7)
Neutrophils Relative %: 76 %
Platelets: 223 10*3/uL (ref 150–400)
RBC: 2.89 MIL/uL — ABNORMAL LOW (ref 4.22–5.81)
RDW: 24.7 % — ABNORMAL HIGH (ref 11.5–15.5)
WBC: 12.2 10*3/uL — ABNORMAL HIGH (ref 4.0–10.5)
nRBC: 0 % (ref 0.0–0.2)

## 2020-09-26 LAB — C-REACTIVE PROTEIN: CRP: 4.9 mg/dL — ABNORMAL HIGH (ref <1.0)

## 2020-09-26 LAB — TSH: TSH: 0.969 u[IU]/mL (ref 0.350–4.500)

## 2020-09-26 LAB — URINALYSIS, MICROSCOPIC (REFLEX): RBC / HPF: >50 RBC/hpf (ref 0–5)

## 2020-09-26 LAB — RESPIRATORY PANEL BY RT PCR (FLU A&B, COVID)
Influenza A by PCR: NEGATIVE
Influenza B by PCR: NEGATIVE
SARS Coronavirus 2 by RT PCR: NEGATIVE

## 2020-09-26 LAB — GLUCOSE, CAPILLARY
Glucose-Capillary: 97 mg/dL (ref 70–99)
Glucose-Capillary: 91 mg/dL (ref 70–99)
Glucose-Capillary: 72 mg/dL (ref 70–99)

## 2020-09-26 LAB — BRAIN NATRIURETIC PEPTIDE: B Natriuretic Peptide: 775.9 pg/mL — ABNORMAL HIGH (ref 0.0–100.0)

## 2020-09-26 LAB — CBG MONITORING, ED: Glucose-Capillary: 111 mg/dL — ABNORMAL HIGH (ref 70–99)

## 2020-09-26 LAB — TRIGLYCERIDES: Triglycerides: 67 mg/dL (ref <150)

## 2020-09-26 MED ORDER — ONDANSETRON HCL 4 MG PO TABS: 4.0000 mg | ORAL_TABLET | Freq: Four times a day (QID) | ORAL | Status: DC | PRN

## 2020-09-26 MED ORDER — LEVOTHYROXINE SODIUM 25 MCG PO TABS
125.0000 ug | ORAL_TABLET | Freq: Every day | ORAL | Status: DC
  Administered 2020-09-26 – 2020-10-12 (×17): 125 ug via ORAL
  Filled 2020-09-26 (×17): qty 1

## 2020-09-26 MED ORDER — POLYETHYLENE GLYCOL 3350 17 G PO PACK
17.0000 g | PACK | Freq: Two times a day (BID) | ORAL | Status: DC
  Administered 2020-09-26 – 2020-10-02 (×9): 17 g via ORAL
  Filled 2020-09-26 (×10): qty 1

## 2020-09-26 MED ORDER — ENSURE ENLIVE PO LIQD
237.0000 mL | Freq: Two times a day (BID) | ORAL | Status: DC
  Administered 2020-09-26 – 2020-09-30 (×5): 237 mL via ORAL
  Filled 2020-09-26: qty 237

## 2020-09-26 MED ORDER — FINASTERIDE 5 MG PO TABS
5.0000 mg | ORAL_TABLET | Freq: Two times a day (BID) | ORAL | Status: DC
  Administered 2020-09-26 – 2020-10-12 (×32): 5 mg via ORAL
  Filled 2020-09-26 (×34): qty 1

## 2020-09-26 MED ORDER — TRAZODONE HCL 50 MG PO TABS
25.0000 mg | ORAL_TABLET | Freq: Every evening | ORAL | Status: DC | PRN
  Administered 2020-09-29: 25 mg via ORAL
  Filled 2020-09-26: qty 1

## 2020-09-26 MED ORDER — LIDOCAINE 5 % EX PTCH
1.0000 | MEDICATED_PATCH | CUTANEOUS | Status: DC
  Administered 2020-09-30 – 2020-10-12 (×12): 1 via TRANSDERMAL
  Filled 2020-09-26 (×15): qty 1

## 2020-09-26 MED ORDER — FENTANYL CITRATE (PF) 100 MCG/2ML IJ SOLN
50.0000 ug | Freq: Once | INTRAMUSCULAR | Status: AC
  Administered 2020-09-26: 50 ug via INTRAVENOUS
  Filled 2020-09-26: qty 2

## 2020-09-26 MED ORDER — VITAMIN B-12 1000 MCG PO TABS
1000.0000 ug | ORAL_TABLET | Freq: Every day | ORAL | Status: DC
  Administered 2020-09-26 – 2020-10-12 (×16): 1000 ug via ORAL
  Filled 2020-09-26 (×16): qty 1

## 2020-09-26 MED ORDER — SERTRALINE HCL 25 MG PO TABS
25.0000 mg | ORAL_TABLET | Freq: Every day | ORAL | Status: DC
  Administered 2020-09-26 – 2020-10-12 (×16): 25 mg via ORAL
  Filled 2020-09-26 (×17): qty 1

## 2020-09-26 MED ORDER — POLYSACCHARIDE IRON COMPLEX 150 MG PO CAPS
150.0000 mg | ORAL_CAPSULE | Freq: Every day | ORAL | Status: DC
  Administered 2020-09-26 – 2020-10-12 (×16): 150 mg via ORAL
  Filled 2020-09-26 (×17): qty 1

## 2020-09-26 MED ORDER — SACCHAROMYCES BOULARDII 250 MG PO CAPS
250.0000 mg | ORAL_CAPSULE | Freq: Two times a day (BID) | ORAL | Status: DC
  Administered 2020-09-26 – 2020-10-12 (×32): 250 mg via ORAL
  Filled 2020-09-26 (×34): qty 1

## 2020-09-26 MED ORDER — IPRATROPIUM-ALBUTEROL 0.5-2.5 (3) MG/3ML IN SOLN: 3.0000 mL | RESPIRATORY_TRACT | Status: DC | PRN

## 2020-09-26 MED ORDER — AMIODARONE HCL 200 MG PO TABS
200.0000 mg | ORAL_TABLET | Freq: Every day | ORAL | Status: DC
  Administered 2020-09-26 – 2020-10-08 (×13): 200 mg via ORAL
  Filled 2020-09-26 (×13): qty 1

## 2020-09-26 MED ORDER — FUROSEMIDE 10 MG/ML IJ SOLN
80.0000 mg | Freq: Once | INTRAMUSCULAR | Status: AC
  Administered 2020-09-26: 80 mg via INTRAVENOUS
  Filled 2020-09-26: qty 8

## 2020-09-26 MED ORDER — HYDROCODONE-ACETAMINOPHEN 5-325 MG PO TABS
1.0000 | ORAL_TABLET | Freq: Three times a day (TID) | ORAL | Status: DC | PRN
  Administered 2020-09-29 – 2020-10-09 (×6): 1 via ORAL
  Filled 2020-09-26 (×6): qty 1

## 2020-09-26 MED ORDER — LEVOTHYROXINE SODIUM 25 MCG PO TABS: 125.0000 ug | ORAL_TABLET | Freq: Every day | ORAL | Status: DC

## 2020-09-26 MED ORDER — INSULIN ASPART 100 UNIT/ML ~~LOC~~ SOLN: 0.0000 [IU] | Freq: Three times a day (TID) | SUBCUTANEOUS | Status: DC

## 2020-09-26 MED ORDER — ONDANSETRON HCL 4 MG/2ML IJ SOLN: 4.0000 mg | Freq: Four times a day (QID) | INTRAMUSCULAR | Status: DC | PRN

## 2020-09-26 MED ORDER — DICLOFENAC SODIUM 1 % EX GEL
4.0000 g | Freq: Two times a day (BID) | CUTANEOUS | Status: DC | PRN
  Filled 2020-09-26: qty 100

## 2020-09-26 MED ORDER — LACTATED RINGERS IV SOLN: INTRAVENOUS | Status: DC

## 2020-09-26 MED ORDER — BENZONATATE 100 MG PO CAPS
100.0000 mg | ORAL_CAPSULE | Freq: Three times a day (TID) | ORAL | Status: DC | PRN
  Administered 2020-10-01 – 2020-10-04 (×2): 100 mg via ORAL
  Filled 2020-09-26 (×2): qty 1

## 2020-09-26 MED ORDER — TORSEMIDE 20 MG PO TABS
60.0000 mg | ORAL_TABLET | Freq: Two times a day (BID) | ORAL | Status: DC
  Administered 2020-09-26 – 2020-09-29 (×6): 60 mg via ORAL
  Filled 2020-09-26 (×9): qty 3

## 2020-09-26 MED ORDER — SENNOSIDES-DOCUSATE SODIUM 8.6-50 MG PO TABS
1.0000 | ORAL_TABLET | Freq: Two times a day (BID) | ORAL | Status: DC
  Administered 2020-09-26 – 2020-10-02 (×10): 1 via ORAL
  Filled 2020-09-26 (×11): qty 1

## 2020-09-26 MED ORDER — METOPROLOL TARTRATE 5 MG/5ML IV SOLN: 5.0000 mg | Freq: Four times a day (QID) | INTRAVENOUS | Status: DC | PRN

## 2020-09-26 MED ORDER — ADULT MULTIVITAMIN W/MINERALS CH
1.0000 | ORAL_TABLET | Freq: Every day | ORAL | Status: DC
  Administered 2020-09-26 – 2020-10-12 (×16): 1 via ORAL
  Filled 2020-09-26 (×16): qty 1

## 2020-09-26 MED ORDER — FENTANYL CITRATE (PF) 100 MCG/2ML IJ SOLN
12.5000 ug | INTRAMUSCULAR | Status: DC | PRN
  Administered 2020-09-29: 12.5 ug via INTRAVENOUS
  Administered 2020-10-10: 25 ug via INTRAVENOUS
  Administered 2020-10-11: 12.5 ug via INTRAVENOUS
  Filled 2020-09-26 (×3): qty 2

## 2020-09-26 MED ORDER — APIXABAN 2.5 MG PO TABS
2.5000 mg | ORAL_TABLET | Freq: Two times a day (BID) | ORAL | Status: DC
  Administered 2020-09-26 – 2020-10-12 (×32): 2.5 mg via ORAL
  Filled 2020-09-26 (×34): qty 1

## 2020-09-26 MED ORDER — SPIRONOLACTONE 12.5 MG HALF TABLET
12.5000 mg | ORAL_TABLET | Freq: Every day | ORAL | Status: DC
  Administered 2020-09-26 – 2020-09-29 (×4): 12.5 mg via ORAL
  Filled 2020-09-26 (×4): qty 1

## 2020-09-26 MED ORDER — IOHEXOL 300 MG/ML  SOLN
100.0000 mL | Freq: Once | INTRAMUSCULAR | Status: AC | PRN
  Administered 2020-09-26: 100 mL via INTRAVENOUS

## 2020-09-26 NOTE — ED Notes (Signed)
PT at bedside.

## 2020-09-26 NOTE — ED Notes (Addendum)
Pt cleaned and repositioned.

## 2020-09-26 NOTE — ED Provider Notes (Signed)
[name] EMERGENCY DEPARTMENT Provider Note   CSN: [id number] Arrival date & time: 09/26/20  0436     History Chief Complaint  Patient presents with  . Abdominal Pain    Ryan Macdonald is a 86 y.o. male.  Patient presents to the emergency department with chief complaint of upper abdominal pain. He states pain started yesterday. He was just discharged from the hospital after having been admitted for UTI. It was recommended that he be discharged to a skilled nursing facility, but patient ended up being discharged home due to financial reasons. He states that over the past 24 hours he has had increasing upper abdominal pain. He denies any fever, chills, cough. Denies nausea or vomiting. He states that he has had some diarrhea. Denies any other associated symptoms.  The history is provided by the patient. No language interpreter was used.       Past Medical History:  Diagnosis Date  . Acute bronchitis 05/10/2017  . Acute respiratory failure (HCC)   . Acute respiratory failure with hypoxia (HCC) 09/27/2017  . AKI (acute kidney injury) (HCC) 02/2019  . AKI (acute kidney injury) (HCC) 03/14/2019  . Atrial fibrillation (HCC)   . CAP (community acquired pneumonia) 02/03/2017  . Cardiomyopathy (HCC)   . Cat scratch fever    removed mass on right side neck  . Cataracts, bilateral   . CHF (congestive heart failure) (HCC)   . CHF exacerbation (HCC) 09/27/2017  . Chronic kidney disease   . Corns and callosities   . Diabetes mellitus without complication (HCC)    TYPE 2  . Dyspnea   . Erythema intertrigo   . Flat foot   . Gout   . High cholesterol   . Hx of urethral stricture   . Hypertension   . Kidney stones   . Morbid obesity (HCC)   . Nonischemic cardiomyopathy (HCC)    a. ? 2009 Cath in [place] - nl cors per pt;  b. 09/2013 Echo: EF 25-30%, sev diff HK.  . Obesity   . Osteoarthritis   . PAF (paroxysmal atrial fibrillation) (HCC)    a. post-op hip in  2014 - prev on xarelto.  . Renal insufficiency     Patient Active Problem List   Diagnosis Date Noted  . Malnutrition of moderate degree 09/23/2020  . Sepsis (HCC) 08/16/2020  . Hematuria 02/15/2020  . Constipation 02/13/2020  . Anemia of chronic disease 02/13/2020  . Secondary hypercoagulable state (HCC) 01/16/2020  . Spasm of muscle of lower back 03/14/2019  . Acute renal failure superimposed on stage 3 chronic kidney disease (HCC) 03/09/2019  . Transaminitis   . Pulmonary emphysema (HCC)   . Chronic respiratory failure with hypoxia (HCC)   . Morbid obesity (HCC) 09/09/2018  . URI (upper respiratory infection) 08/31/2018  . UTI (urinary tract infection) 08/31/2018  . CHF (congestive heart failure) (HCC) 08/14/2018  . First degree AV block   . Shortness of breath 04/29/2018  . Renal insufficiency   . Left knee pain 07/08/2017  . Elevated troponin 04/14/2017  . Lactic acidosis   . Diabetes mellitus type 2 in obese (HCC) 02/03/2017  . Lobar pneumonia (HCC)   . Diabetes mellitus with complication (HCC)   . Depression 05/19/2016  . Gout 05/19/2016  . 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 NSVT (nonsustained ventricular tachycardia) (HCC)   . Chronic renal insufficiency, stage 3 (moderate) (HCC)   . Hyperkalemia 08/21/2015  . Pulmonary embolism (HCC)   .  Osteoarthritis of left hip 10/25/2013  . PAF (paroxysmal atrial fibrillation) (HCC) 10/25/2013  . S/P hip replacement 10/24/2013  . Nonischemic cardiomyopathy (HCC)   . Obesity (BMI 30-39.9)   . Essential hypertension   . Hyperlipidemia     Past Surgical History:  Procedure Laterality Date  . CARDIAC CATHETERIZATION  1980's  . CARDIOVERSION N/A 01/15/2020   Procedure: CARDIOVERSION;  Surgeon: Laurey Morale, MD;  Location: [facility] ENDOSCOPY;  Service: Cardiovascular;  Laterality: N/A;  . CIRCUMCISION  1940's  . CORONARY STENT INTERVENTION N/A 08/18/2018   Procedure: CORONARY STENT INTERVENTION;   Surgeon: Corky Crafts, MD;  Location: [facility] INVASIVE CV LAB;  Service: Cardiovascular;  Laterality: N/A;  . CYSTOSCOPY WITH URETHRAL DILATATION  10/23/2013  . CYSTOSCOPY WITH URETHRAL DILATATION N/A 10/23/2013   Procedure: CYSTOSCOPY WITH URETHRAL DILATATION;  Surgeon: Kathi Ludwig, MD;  Location: [facility] OR;  Service: Urology;  Laterality: N/A;  . INCISION AND DRAINAGE OF WOUND Right 1980's   "cat scratch" (10/23/2013)  . INGUINAL HERNIA REPAIR Right 1950's  . INTRAVASCULAR ULTRASOUND/IVUS N/A 08/18/2018   Procedure: Intravascular Ultrasound/IVUS;  Surgeon: Corky Crafts, MD;  Location: [name] INVASIVE CV LAB;  Service: Cardiovascular;  Laterality: N/A;  . KIDNEY STONE SURGERY Right 1970's; 1983   "twice"  . LEFT HEART CATH AND CORONARY ANGIOGRAPHY N/A 08/18/2018   Procedure: LEFT HEART CATH AND CORONARY ANGIOGRAPHY;  Surgeon: Corky Crafts, MD;  Location: [facility] INVASIVE CV LAB;  Service: Cardiovascular;  Laterality: N/A;  . RIGHT HEART CATH N/A 02/19/2020   Procedure: RIGHT HEART CATH;  Surgeon: Laurey Morale, MD;  Location: [facility] INVASIVE CV LAB;  Service: Cardiovascular;  Laterality: N/A;  . TEE WITHOUT CARDIOVERSION N/A 01/15/2020   Procedure: TRANSESOPHAGEAL ECHOCARDIOGRAM (TEE);  Surgeon: Laurey Morale, MD;  Location: [facility] ENDOSCOPY;  Service: Cardiovascular;  Laterality: N/A;  . TONSILLECTOMY AND ADENOIDECTOMY  1940's  . TOTAL HIP ARTHROPLASTY Left 10/23/2013   Procedure: TOTAL HIP ARTHROPLASTY;  Surgeon: Valeria Batman, MD;  Location: [facility] OR;  Service: Orthopedics;  Laterality: Left;       Family History  Problem Relation Age of Onset  . Heart disease Mother   . Heart Problems Maternal Grandmother   . Other Other        negative for premature CAD    Social History   Tobacco Use  . Smoking status: Former Smoker    Packs/day: 1.00    Years: 15.00    Pack years: 15.00    Types: Cigarettes, Cigars  . Smokeless tobacco: Former Neurosurgeon  . Tobacco comment: quit  smoking ~ 50 yr ago  Vaping Use  . Vaping Use: Never used  Substance Use Topics  . Alcohol use: No    Alcohol/week: 2.0 standard drinks    Types: 2 Cans of beer per week    Comment: rare beer.  . Drug use: No    Home Medications Prior to Admission medications   Medication Sig Start Date End Date Taking? Authorizing Provider  allopurinol (ZYLOPRIM) 100 MG tablet Take 200 mg by mouth daily.     [provider]  amiodarone (PACERONE) 200 MG tablet Take 1 tablet (200 mg total) by mouth daily. 05/29/20   Laurey Morale, MD  apixaban (ELIQUIS) 2.5 MG TABS tablet Take 1 tablet (2.5 mg total) by mouth 2 (two) times daily. 03/27/20   Laurey Morale, MD  benzonatate (TESSALON) 100 MG capsule Take 1 capsule (100 mg total) by mouth 3 (three) times daily as needed  for cough. 08/21/20   Osvaldo Shipper, MD  cyanocobalamin 1000 MCG tablet Take 1 tablet (1,000 mcg total) by mouth daily. 03/13/19   Laurey Morale, MD  diclofenac Sodium (VOLTAREN) 1 % GEL Apply 4 g topically 4 (four) times daily. Patient taking differently: Apply 4 g topically 2 (two) times daily as needed.  02/24/20   Marguerita Merles Latif, DO  feeding supplement (ENSURE ENLIVE / ENSURE PLUS) LIQD Take 237 mLs by mouth 2 (two) times daily between meals. 09/25/20   Mikhail, Nita Sells, DO  finasteride (PROSCAR) 5 MG tablet Take 1 tablet (5 mg total) by mouth daily. Patient taking differently: Take 5 mg by mouth in the morning and at bedtime.  11/29/18   Alford Highland, NP  HYDROcodone-acetaminophen (NORCO/VICODIN) 5-325 MG tablet Take 1 tablet by mouth 3 (three) times daily as needed for severe pain. 08/21/20   Osvaldo Shipper, MD  ipratropium-albuterol (DUONEB) 0.5-2.5 (3) MG/3ML SOLN Take 3 mLs by nebulization every 4 (four) hours as needed. 05/14/17   Pearson Grippe, MD  iron polysaccharides (NIFEREX) 150 MG capsule Take 1 capsule (150 mg total) by mouth daily. 08/21/20   Osvaldo Shipper, MD  levothyroxine (SYNTHROID) 125 MCG tablet Take 1  tablet (125 mcg total) by mouth daily before breakfast. 08/01/19   Carlus Pavlov, MD  lidocaine (LIDODERM) 5 % Place 1 patch onto the skin daily. Remove & Discard patch within 12 hours or as directed by MD 08/22/20   Osvaldo Shipper, MD  magnesium oxide (MAG-OX) 400 MG tablet Take 400 mg by mouth daily.     [provider]  Multiple Vitamin (MULTIVITAMIN WITH MINERALS) TABS tablet Take 1 tablet by mouth daily. 09/26/20   Mikhail, Nita Sells, DO  polyethylene glycol (MIRALAX / GLYCOLAX) 17 g packet Take 17 g by mouth 2 (two) times daily. 02/24/20   Marguerita Merles Latif, DO  rosuvastatin (CRESTOR) 10 MG tablet Take 1 tablet (10 mg total) by mouth daily at 6 PM. 12/22/18   [name], MD  saccharomyces boulardii (FLORASTOR) 250 MG capsule Take 1 capsule (250 mg total) by mouth 2 (two) times daily. 08/21/20   Osvaldo Shipper, MD  senna-docusate (SENOKOT-S) 8.6-50 MG tablet Take 1 tablet by mouth 2 (two) times daily. 02/24/20   Marguerita Merles Latif, DO  sertraline (ZOLOFT) 25 MG tablet Take 25 mg by mouth daily.     [provider]  spironolactone (ALDACTONE) 25 MG tablet Take 12.5 mg by mouth at bedtime.    [provider]  torsemide (DEMADEX) 20 MG tablet Take 3 tablets (60 mg total) by mouth 2 (two) times daily. 09/25/20   Edsel Petrin, DO    Allergies    Lisinopril and Xarelto [rivaroxaban]  Review of Systems   Review of Systems  All other systems reviewed and are negative.   Physical Exam Updated Vital Signs BP (!) 119/57 (BP Location: Right Arm)   Pulse 80   Temp 97.7 F (36.5 C) (Oral)   Resp 18   SpO2 97%   Physical Exam Vitals and nursing note reviewed.  Constitutional:      Appearance: He is well-developed.  HENT:     Head: Normocephalic and atraumatic.  Eyes:     Conjunctiva/sclera: Conjunctivae normal.  Cardiovascular:     Rate and Rhythm: Normal rate and regular rhythm.     Heart sounds: No murmur heard.   Pulmonary:     Effort:  Pulmonary effort is normal. No respiratory distress.     Breath sounds: Normal breath  sounds.  Abdominal:     Palpations: Abdomen is soft.     Tenderness: There is abdominal tenderness.     Comments: Mild upper abdominal discomfort  Musculoskeletal:     Cervical back: Neck supple.  Skin:    General: Skin is warm and dry.  Neurological:     Mental Status: He is alert and oriented to person, place, and time.  Psychiatric:        Mood and Affect: Mood normal.        Behavior: Behavior normal.     ED Results / Procedures / Treatments   Labs (all labs ordered are listed, but only abnormal results are displayed) Labs Reviewed  CBC WITH DIFFERENTIAL/PLATELET - Abnormal; Notable for the following components:      Result Value   WBC 12.2 (*)    RBC 2.89 (*)    Hemoglobin 9.1 (*)    HCT 28.8 (*)    RDW 24.7 (*)    Neutro Abs 9.3 (*)    Abs Immature Granulocytes 0.08 (*)    All other components within normal limits  COMPREHENSIVE METABOLIC PANEL - Abnormal; Notable for the following components:   Potassium 5.4 (*)    Chloride 115 (*)    CO2 21 (*)    Glucose, Bld 112 (*)    BUN 25 (*)    Creatinine, Ser 1.41 (*)    Calcium 8.7 (*)    Albumin 1.9 (*)    AST 138 (*)    Total Bilirubin 2.1 (*)    GFR, Estimated 49 (*)    All other components within normal limits  LIPASE, BLOOD - Abnormal; Notable for the following components:   Lipase 116 (*)    All other components within normal limits  URINALYSIS, ROUTINE W REFLEX MICROSCOPIC    EKG None  Radiology CT ABDOMEN PELVIS W CONTRAST  Result Date: 09/26/2020 CLINICAL DATA:  Acute, nonlocalized abdominal pain EXAM: CT ABDOMEN AND PELVIS WITH CONTRAST TECHNIQUE: Multidetector CT imaging of the abdomen and pelvis was performed using the standard protocol following bolus administration of intravenous contrast. CONTRAST:  OMNIPAQUE IOHEXOL 300 MG/ML  SOLN COMPARISON:  03/08/2019 FINDINGS: Lower chest: Large right and small  left pleural effusions with multi segment atelectasis. The left ventricle may be dilated. Aortic and coronary atherosclerosis. Hepatobiliary: There may be periportal edema. No overt cirrhotic changes.Full gallbladder without root still fat stranding or calcified choledocholithiasis, unchanged. Pancreas: No focal peripancreatic edema or definite expansion. Spleen: Negative Adrenals/Urinary Tract: Negative adrenals. Numerous and branching right renal calculi with severe right renal cortical atrophy. No hydronephrosis or renal inflammation seen. Chronic large upward bladder diverticulum which partially contains gas today. Bladder is distended to a moderate degree. No detected bladder wall thickening. Stomach/Bowel: No obstruction. No visible bowel inflammation. Short gastric varices are seen. The major portal venous system appears patent. Vascular/Lymphatic: No acute vascular abnormality. Extensive atherosclerosis. No mass or adenopathy. Mild colonic diverticulosis. Reproductive:The prostate region is obscured by streak artifact, there may have been prior prostatectomy. Other: No ascites or pneumoperitoneum. Right lateral hernia which may be postsurgical. There is shallow bulging right-sided colon, as before. Mild retroperitoneal edema, nonspecific. Musculoskeletal: Advanced lumbar spine degeneration. Left hip arthroplasty. IMPRESSION: 1. Large right and small left pleural effusion with multi segment atelectasis. 2. Short gastric varices. 3. Severe right renal atrophy with extensive right nephrolithiasis. 4. Distended bladder with bladder diverticulum. 5. Right posterolateral abdominal wall hernia containing nonobstructive bowel. Electronically Signed   By: Kathrynn Ducking.D.  On: 09/26/2020 05:50    Procedures Procedures (including critical care time)  Medications Ordered in ED Medications  iohexol (OMNIPAQUE) 300 MG/ML solution 100 mL (100 mLs Intravenous Contrast Given 09/26/20 0532)    ED Course  I  have reviewed the triage vital signs and the nursing notes.  Pertinent labs & imaging results that were available during my care of the patient were reviewed by me and considered in my medical decision making (see chart for details).    MDM Rules/Calculators/A&P                         This patient complains of abdominal pain, this involves an extensive number of treatment options, and is a complaint that carries with it a high risk of complications and morbidity.    Differential Dx GERD, pancreatitis, colitis  Pertinent Labs I ordered, reviewed, and interpreted labs, which included W BC 12.2, K5.4, lipase 116.  Imaging Interpretation I ordered imaging studies which included CT abdomen/pelvis, which showed large right pleural effusion, short gastric varices, right renal atrophy, distended bladder.   Medications I ordered medication fentanyl for pain.  Sources Previous records obtained and reviewed and show recent admission for UTI. Just discharged yesterday.   Critical Interventions  None.  Reassessments After the interventions stated above, I reevaluated the patient and found unchanged.  Consultants Hospitalist, Dr. Toniann Fail, who will have patient readmitted for his abdominal pain, increasing WBC, elevated lipase.  Also notable pleural effusion.  Plan Admit   Final Clinical Impression(s) / ED Diagnoses Final diagnoses:  Epigastric pain  Elevated lipase    Rx / DC Orders ED Discharge Orders    None       Roxy Horseman, PA-C 09/26/20 3338    Marily Memos, MD 09/26/20 2300

## 2020-09-26 NOTE — Evaluation (Signed)
Physical Therapy Evaluation Patient Details Name: Ryan Macdonald MRN: [id number] DOB: May 13, 1934 Today's Date: 09/26/2020   History of Present Illness  Pt is an 86 y/o male admitted secondary to pancreatitis. Pt with recent admissions for UTI. PMH includes HTN, CHF, s/p AICD, a fib, CKD, gout, DM.   Clinical Impression  Pt admitted secondary to problem above with deficits below. Pt requiring mod to max A to roll on stretcher for clean up. Pt with cognitive deficits including STM and poor awareness of deficits. No family present to determine baseline. Per previous notes, pt was d/c'd home instead of back to SNF after previous admission. Feel pt would benefit from SNF, however, pt may return home at d/c. If pt returns home, will require max HH services. Will continue to follow acutely to maximize functional mobility independence and safety.     Follow Up Recommendations SNF;Supervision/Assistance - 24 hour    Equipment Recommendations  Wheelchair (measurements PT);Wheelchair cushion (measurements PT);Hospital bed;Other (comment) (hoyer lift and hoyer lift pad)    Recommendations for Other Services       Precautions / Restrictions Precautions Precautions: Fall;Other (comment);ICD/Pacemaker Precaution Comments: L heel wound Restrictions Weight Bearing Restrictions: No      Mobility  Bed Mobility Overal bed mobility: Needs Assistance Bed Mobility: Rolling Rolling: Mod assist;Max assist         General bed mobility comments: Mod to max A for rolling for clean up on stretcher in ED. Further mobility deferred as unsafe to attempt from higher strecher height.     Transfers                 General transfer comment: deferred  Ambulation/Gait                Stairs            Wheelchair Mobility    Modified Rankin (Stroke Patients Only)       Balance                                             Pertinent Vitals/Pain Pain  Assessment: No/denies pain    Home Living Family/patient expects to be discharged to:: Private residence Living Arrangements: Spouse/significant other Available Help at Discharge: Family Type of Home: Apartment Home Access: Stairs to enter Entrance Stairs-Rails: Right Entrance Stairs-Number of Steps: 8 Home Layout: One level Home Equipment: Environmental consultant - 2 wheels;Cane - single point Additional Comments: Information from previous admission. Was discharged home after previous admission instead of returning to SNF.     Prior Function Level of Independence: Needs assistance   Gait / Transfers Assistance Needed: Per previous notes, used lift at SNF for transfers. Unsure of mobility status at home secondary to cognitive impairment.   ADL's / Homemaking Assistance Needed: Required assist for all ADLs.         Hand Dominance        Extremity/Trunk Assessment   Upper Extremity Assessment Upper Extremity Assessment: Generalized weakness    Lower Extremity Assessment Lower Extremity Assessment: Generalized weakness       Communication   Communication: No difficulties  Cognition Arousal/Alertness: Awake/alert Behavior During Therapy: Flat affect Overall Cognitive Status: Impaired/Different from baseline Area of Impairment: Orientation;Attention;Memory;Safety/judgement;Problem solving;Awareness                 Orientation Level: Disoriented to;Place;Time;Situation Current Attention Level: Sustained Memory: Decreased short-term  memory   Safety/Judgement: Decreased awareness of safety;Decreased awareness of deficits Awareness: Intellectual Problem Solving: Slow processing;Decreased initiation;Difficulty sequencing;Requires verbal cues;Requires tactile cues General Comments: Only oriented to place. Decreased awarness of deficits and safety noted. Flat affect throughout.       General Comments      Exercises     Assessment/Plan    PT Assessment Patient needs continued  PT services  PT Problem List Decreased strength;Decreased cognition;Decreased knowledge of use of DME;Obesity;Decreased activity tolerance;Decreased safety awareness;Decreased balance;Decreased skin integrity;Decreased knowledge of precautions;Decreased mobility;Decreased coordination       PT Treatment Interventions DME instruction;Balance training;Gait training;Stair training;Cognitive remediation;Functional mobility training;Patient/family education;Therapeutic activities;Therapeutic exercise;Wheelchair mobility training    PT Goals (Current goals can be found in the Care Plan section)  Acute Rehab PT Goals PT Goal Formulation: Patient unable to participate in goal setting Time For Goal Achievement: 10/10/20 Potential to Achieve Goals: Fair    Frequency Min 2X/week   Barriers to discharge        Co-evaluation               AM-PAC PT "6 Clicks" Mobility  Outcome Measure Help needed turning from your back to your side while in a flat bed without using bedrails?: Total Help needed moving from lying on your back to sitting on the side of a flat bed without using bedrails?: Total Help needed moving to and from a bed to a chair (including a wheelchair)?: Total Help needed standing up from a chair using your arms (e.g., wheelchair or bedside chair)?: Total Help needed to walk in hospital room?: Total Help needed climbing 3-5 steps with a railing? : Total 6 Click Score: 6    End of Session   Activity Tolerance: Patient tolerated treatment well Patient left: in bed;with call bell/phone within reach;Other (comment) (on stretcher in ED ) Nurse Communication: Mobility status;Other (comment) (very dark urine) PT Visit Diagnosis: Muscle weakness (generalized) (M62.81);Other abnormalities of gait and mobility (R26.89);Difficulty in walking, not elsewhere classified (R26.2);Unsteadiness on feet (R26.81)    Time: 4481-8563 PT Time Calculation (min) (ACUTE ONLY): 16 min   Charges:    PT Evaluation $PT Eval Moderate Complexity: 1 Mod          Farley Ly, PT, DPT  Acute Rehabilitation Services  Pager: [phone] Office: [phone]   Lehman Prom 09/26/2020, 2:33 PM

## 2020-09-26 NOTE — ED Notes (Signed)
Patient transported to Ultrasound 

## 2020-09-26 NOTE — ED Triage Notes (Signed)
Patient presents from home with chief complaint of ABD pain. Reports upper ABD pain, denies lower ABD pain. ABD soft and nontender to palpation. Denies N/V. Patient reports watery stool 2 days ago as last BM. Patient recently treated with ABX, came home from [facility] yesterday. Patient AAOx3 to baseline per wife as reported by EMS.

## 2020-09-26 NOTE — Consult Note (Addendum)
WOC Nurse Consult Note: Patient care given in [phone]. Remote consult Discharged yesterday. This patient was previously consulted to the [facility] service and was seen by L. McNichol (See Note 10/24). Will continue previous orders Reason for Consult: Wound on foot.  Wound type: Pressure Pressure Injury POA: Yes Reviewed photos taken today and will continue dressings as follows:  Apply Xeroform gauze [place] # [id number]) to posterior left heel, secure with dry gauze and wrap a few turns of Kerlix roll gauze to secure.  Apply Prevelon boot [place] # [phone]) to the left foot.  Monitor the wound area(s) for worsening of condition such as: Signs/symptoms of infection, increase in size, development of or worsening of odor, development of pain, or increased pain at the affected locations.   Notify the medical team if any of these develop.  Thank you for the consult. WOC nurse will not follow at this time.   Please re-consult the WOC team if needed.  Renaldo Reel Katrinka Blazing, MSN, RN, CMSRN, Angus Seller, [facility] Wound Treatment Associate Pager [phone]

## 2020-09-26 NOTE — H&P (Signed)
History and Physical    Medford Staheli KXF:[id number] DOB: [birth year] DOA: 09/26/2020  PCP: [facility]  Patient coming from: Home  I have personally briefly reviewed patient's old medical records in [facility]  Chief Complaint: SOB  HPI: Ryan Macdonald is a 86 y.o. male with medical history significant of HFrEF (EF 20 to 25%, AICD in place), paroxysmal A. fib on amiodarone and Eliquis, hypertension, chronic kidney disease 3B, hyperlipidemia on a statin, gout, hypothyroidism, and recent sepsis related to a UTI.  He was just discharged on yesterday to home following admission for presumed UTI.  Prior to this he was admitted in September for the same and was sent to [facility] for acute rehab.  He was unable to go back to rehab due to financial concerns.  His wife states he came home around 10 PM by EMS and she basically put him to bed.  He woke her up at 1 AM suggesting that his stomach was hurting she gave him water into Tylenol and he dozed off again.  He then woke up again told her he was hurting really bad and asked her to call EMS.  She denies any frank emesis but says at one point she thought he might throw up. ED Course: In the emergency department labs showed an elevated white blood cell count of 12.2, stable hemoglobin at 9.1, stable creatinine at 1.41, elevated potassium at 5.4, and elevated AST at 138, and elevated lipase at 116 and a CT that showed right greater than left pleural effusion, periportal edema, renal stones, gastric varices, right hernia, bladder distention.  We are asked to admit for pancreatitis.  Review of Systems: As per HPI otherwise 10 point review of systems negative.   Past Medical History:  Diagnosis Date  . Acute bronchitis 05/10/2017  . Acute respiratory failure (HCC)   . Acute respiratory failure with hypoxia (HCC) 09/27/2017  . AKI (acute kidney injury) (HCC) 02/2019  . AKI (acute kidney injury) (HCC) 03/14/2019  . Atrial  fibrillation (HCC)   . CAP (community acquired pneumonia) 02/03/2017  . Cardiomyopathy (HCC)   . Cat scratch fever    removed mass on right side neck  . Cataracts, bilateral   . CHF (congestive heart failure) (HCC)   . CHF exacerbation (HCC) 09/27/2017  . Chronic kidney disease   . Corns and callosities   . Diabetes mellitus without complication (HCC)    TYPE 2  . Dyspnea   . Erythema intertrigo   . Flat foot   . Gout   . High cholesterol   . Hx of urethral stricture   . Hypertension   . Kidney stones   . Morbid obesity (HCC)   . Nonischemic cardiomyopathy (HCC)    a. ? 2009 Cath in [place] - nl cors per pt;  b. 09/2013 Echo: EF 25-30%, sev diff HK.  . Obesity   . Osteoarthritis   . PAF (paroxysmal atrial fibrillation) (HCC)    a. post-op hip in 2014 - prev on xarelto.  . Renal insufficiency     Past Surgical History:  Procedure Laterality Date  . CARDIAC CATHETERIZATION  1980's  . CARDIOVERSION N/A 01/15/2020   Procedure: CARDIOVERSION;  Surgeon: Laurey Morale, MD;  Location: [facility] ENDOSCOPY;  Service: Cardiovascular;  Laterality: N/A;  . CIRCUMCISION  1940's  . CORONARY STENT INTERVENTION N/A 08/18/2018   Procedure: CORONARY STENT INTERVENTION;  Surgeon: Corky Crafts, MD;  Location: [facility] INVASIVE CV LAB;  Service: Cardiovascular;  Laterality: N/A;  . CYSTOSCOPY WITH URETHRAL DILATATION  10/23/2013  . CYSTOSCOPY WITH URETHRAL DILATATION N/A 10/23/2013   Procedure: CYSTOSCOPY WITH URETHRAL DILATATION;  Surgeon: Kathi Ludwig, MD;  Location: [facility] OR;  Service: Urology;  Laterality: N/A;  . INCISION AND DRAINAGE OF WOUND Right 1980's   "cat scratch" (10/23/2013)  . INGUINAL HERNIA REPAIR Right 1950's  . INTRAVASCULAR ULTRASOUND/IVUS N/A 08/18/2018   Procedure: Intravascular Ultrasound/IVUS;  Surgeon: Corky Crafts, MD;  Location: [facility] INVASIVE CV LAB;  Service: Cardiovascular;  Laterality: N/A;  . KIDNEY STONE SURGERY Right 1970's; 1983   "twice"  . LEFT HEART CATH  AND CORONARY ANGIOGRAPHY N/A 08/18/2018   Procedure: LEFT HEART CATH AND CORONARY ANGIOGRAPHY;  Surgeon: Corky Crafts, MD;  Location: [facility] INVASIVE CV LAB;  Service: Cardiovascular;  Laterality: N/A;  . RIGHT HEART CATH N/A 02/19/2020   Procedure: RIGHT HEART CATH;  Surgeon: Laurey Morale, MD;  Location: [facility] INVASIVE CV LAB;  Service: Cardiovascular;  Laterality: N/A;  . TEE WITHOUT CARDIOVERSION N/A 01/15/2020   Procedure: TRANSESOPHAGEAL ECHOCARDIOGRAM (TEE);  Surgeon: Laurey Morale, MD;  Location: [facility] ENDOSCOPY;  Service: Cardiovascular;  Laterality: N/A;  . TONSILLECTOMY AND ADENOIDECTOMY  1940's  . TOTAL HIP ARTHROPLASTY Left 10/23/2013   Procedure: TOTAL HIP ARTHROPLASTY;  Surgeon: Valeria Batman, MD;  Location: [facility] OR;  Service: Orthopedics;  Laterality: Left;     reports that he has quit smoking. His smoking use included cigarettes and cigars. He has a 15.00 pack-year smoking history. He has quit using smokeless tobacco. He reports that he does not drink alcohol and does not use drugs.  Allergies  Allergen Reactions  . Lisinopril Swelling    Facial swelling  . Xarelto [Rivaroxaban] Other (See Comments)    "Blood came out of my penis"    Family History  Problem Relation Age of Onset  . Heart disease Mother   . Heart Problems Maternal Grandmother   . Other Other        negative for premature CAD    Prior to Admission medications   Medication Sig Start Date End Date Taking? Authorizing Provider  acetaminophen (TYLENOL) 325 MG tablet Take 650 mg by mouth every 6 (six) hours as needed.   Yes [provider]  amiodarone (PACERONE) 200 MG tablet Take 1 tablet (200 mg total) by mouth daily. 05/29/20  Yes Laurey Morale, MD  allopurinol (ZYLOPRIM) 100 MG tablet Take 200 mg by mouth daily.     [provider]  apixaban (ELIQUIS) 2.5 MG TABS tablet Take 1 tablet (2.5 mg total) by mouth 2 (two) times daily. 03/27/20   Laurey Morale, MD  benzonatate  (TESSALON) 100 MG capsule Take 1 capsule (100 mg total) by mouth 3 (three) times daily as needed for cough. 08/21/20   Osvaldo Shipper, MD  cyanocobalamin 1000 MCG tablet Take 1 tablet (1,000 mcg total) by mouth daily. 03/13/19   Laurey Morale, MD  diclofenac Sodium (VOLTAREN) 1 % GEL Apply 4 g topically 4 (four) times daily. Patient taking differently: Apply 4 g topically 2 (two) times daily as needed.  02/24/20   Marguerita Merles Latif, DO  feeding supplement (ENSURE ENLIVE / ENSURE PLUS) LIQD Take 237 mLs by mouth 2 (two) times daily between meals. 09/25/20   Mikhail, Nita Sells, DO  finasteride (PROSCAR) 5 MG tablet Take 1 tablet (5 mg total) by mouth daily. Patient taking differently: Take 5 mg by mouth in the morning and at bedtime.  11/29/18  Alford Highland, NP  HYDROcodone-acetaminophen (NORCO/VICODIN) 5-325 MG tablet Take 1 tablet by mouth 3 (three) times daily as needed for severe pain. 08/21/20   Osvaldo Shipper, MD  ipratropium-albuterol (DUONEB) 0.5-2.5 (3) MG/3ML SOLN Take 3 mLs by nebulization every 4 (four) hours as needed. 05/14/17   Pearson Grippe, MD  iron polysaccharides (NIFEREX) 150 MG capsule Take 1 capsule (150 mg total) by mouth daily. 08/21/20   Osvaldo Shipper, MD  levothyroxine (SYNTHROID) 125 MCG tablet Take 1 tablet (125 mcg total) by mouth daily before breakfast. 08/01/19   Carlus Pavlov, MD  lidocaine (LIDODERM) 5 % Place 1 patch onto the skin daily. Remove & Discard patch within 12 hours or as directed by MD 08/22/20   Osvaldo Shipper, MD  magnesium oxide (MAG-OX) 400 MG tablet Take 400 mg by mouth daily.     [provider]  Multiple Vitamin (MULTIVITAMIN WITH MINERALS) TABS tablet Take 1 tablet by mouth daily. 09/26/20   Mikhail, Nita Sells, DO  polyethylene glycol (MIRALAX / GLYCOLAX) 17 g packet Take 17 g by mouth 2 (two) times daily. 02/24/20   Marguerita Merles Latif, DO  rosuvastatin (CRESTOR) 10 MG tablet Take 1 tablet (10 mg total) by mouth daily at 6 PM. 12/22/18    [name], MD  saccharomyces boulardii (FLORASTOR) 250 MG capsule Take 1 capsule (250 mg total) by mouth 2 (two) times daily. 08/21/20   Osvaldo Shipper, MD  senna-docusate (SENOKOT-S) 8.6-50 MG tablet Take 1 tablet by mouth 2 (two) times daily. 02/24/20   Marguerita Merles Latif, DO  sertraline (ZOLOFT) 25 MG tablet Take 25 mg by mouth daily.     [provider]  spironolactone (ALDACTONE) 25 MG tablet Take 12.5 mg by mouth at bedtime.    [provider]  torsemide (DEMADEX) 20 MG tablet Take 3 tablets (60 mg total) by mouth 2 (two) times daily. 09/25/20   Edsel Petrin, DO    Physical Exam: Vitals:   09/26/20 0815 09/26/20 1000 09/26/20 1005 09/26/20 1009  BP: (!) 98/55 (!) 102/52    Pulse: 74 73 71 70  Resp: (!) 21 (!) Temp:      TempSrc:      SpO2: 95% 97% 97% 97%    Constitutional: NAD, calm, comfortable, pleasant Eyes:  lids and conjunctivae normal, sclera without icterus ENMT: Mucous membranes are moist. Normal dentition.  Neck: normal, supple, no masses, no thyromegaly Respiratory: Distant breath sounds.  Mild tachypnea, no accessory muscle use.  Cardiovascular: Regular rate and rhythm, no murmurs / rubs / gallops. No extremity edema. 2+ pedal pulses. No carotid bruits.  Abdomen: Protuberant, no masses palpated. No hepatosplenomegaly. Bowel sounds positive.  Specifically no right upper quadrant or epigastric tenderness. Musculoskeletal: no clubbing / cyanosis.   Skin: no rashes, he does have a boot in place on the left lower extremity from a store he developed in the nursing home Neurologic: CN 2-12 grossly intact. Sensation intact, DTR normal. Strength 5/5 in all 4.  Psychiatric: Normal judgment and insight. Alert and oriented x 3. Normal mood.   Labs on Admission: I have personally reviewed following labs and imaging studies  CBC: Recent Labs  Lab 09/20/20 1506 09/20/20 1506 09/21/20 0429 09/21/20 0429 09/22/20 1040 09/23/20 0231  09/24/20 0239 09/25/20 0241 09/26/20 0500  WBC 6.6   < > 6.7  --  6.6 7.5 8.9  --  12.2*  NEUTROABS 5.2  --   --   --   --   --   --   --  9.3*  HGB 9.4*   < > 9.2*   < > 8.1* 8.3* 8.2* 8.4* 9.1*  HCT 29.9*   < > 28.8*   < > 25.1* 25.8* 25.7* 26.3* 28.8*  MCV 98.4   < > 97.0  --  96.9 98.1 97.7  --  99.7  PLT 229   < > 236  --  205 207 205  --  223   < > = values in this interval not displayed.   Basic Metabolic Panel: Recent Labs  Lab 09/22/20 1040 09/23/20 0231 09/24/20 0239 09/25/20 0241 09/26/20 0500  NA 144 146* 144 144 143  K 3.9 4.1 3.8 3.7 5.4*  CL 111 114* 116* 116* 115*  CO2 25 24 21* 21* 21*  GLUCOSE 96 79 119* 83 112*  BUN 50* 41* 32* 26* 25*  CREATININE 2.45* 2.04* 1.70* 1.47* 1.41*  CALCIUM 8.5* 8.4* 8.6* 8.5* 8.7*   GFR: Estimated Creatinine Clearance: 36.5 mL/min (A) (by C-G formula based on SCr of 1.41 mg/dL (H)). Liver Function Tests: Recent Labs  Lab 09/20/20 1506 09/26/20 0500  AST 55* 138*  ALT 22 31  ALKPHOS 85 104  BILITOT 1.4* 2.1*  PROT 7.3 7.0  ALBUMIN 2.1* 1.9*   Recent Labs  Lab 09/26/20 0500  LIPASE 116*   CBG: Recent Labs  Lab 09/20/20 1443 09/24/20 1643  GLUCAP 87 99   Urine analysis:    Component Value Date/Time   COLORURINE AMBER (A) 09/20/2020 1600   APPEARANCEUR CLOUDY (A) 09/20/2020 1600   LABSPEC 1.011 09/20/2020 1600   PHURINE 5.0 09/20/2020 1600   GLUCOSEU NEGATIVE 09/20/2020 1600   HGBUR MODERATE (A) 09/20/2020 1600   BILIRUBINUR NEGATIVE 09/20/2020 1600   KETONESUR NEGATIVE 09/20/2020 1600   PROTEINUR 100 (A) 09/20/2020 1600   UROBILINOGEN 0.2 05/26/2015 1521   NITRITE NEGATIVE 09/20/2020 1600   LEUKOCYTESUR LARGE (A) 09/20/2020 1600    Radiological Exams on Admission: US Abdomen Complete  Result Date: 09/26/2020 CLINICAL DATA:  Abdominal pain, pancreatitis. EXAM: ABDOMEN ULTRASOUND COMPLETE COMPARISON:  None. FINDINGS: Gallbladder: No gallstones. There is diffuse gallbladder wall thickening. No  sonographic Murphy sign noted by sonographer. Common bile duct: Diameter: 5.6 mm, within normal limits. Liver: No focal lesion identified. Within normal limits in parenchymal echogenicity. Portal vein is patent on color Doppler imaging with normal direction of blood flow towards the liver. IVC: No abnormality visualized. Pancreas: Limited visualization secondary to overlying bowel gas. Spleen: Size and appearance within normal limits. Right Kidney: Length: 10.5 cm in length. Echogenicity within normal limits. No hydronephrosis. Multiple calculi, the largest measuring 2.1 cm. Left Kidney: Length: 11.9 cm in length. Echogenicity within normal limits. No hydronephrosis. There are 2 cysts, the largest measuring 2.0 cm in the interpolar lateral kidney. Abdominal aorta: No aneurysm visualized. Other findings: None. IMPRESSION: 1. Diffuse gallbladder wall thickening, which may be reactive or relate to hepatic dysfunction or congestive heart failure/volume status given no other evidence of acute cholecystitis (negative Murphy sign and no cholelithiasis). If there is clinical concern for chronic cholecystitis, HIDA scan could further evaluate. 2. Right nephrolithiasis. 3. Limited visualization of the pancreas. Electronically Signed   By: Feliberto Harts MD   On: 09/26/2020 10:15   CT ABDOMEN PELVIS W CONTRAST  Result Date: 09/26/2020 CLINICAL DATA:  Acute, nonlocalized abdominal pain EXAM: CT ABDOMEN AND PELVIS WITH CONTRAST TECHNIQUE: Multidetector CT imaging of the abdomen and pelvis was performed using the standard protocol following bolus administration of intravenous contrast. CONTRAST:  OMNIPAQUE IOHEXOL  300 MG/ML  SOLN COMPARISON:  03/08/2019 FINDINGS: Lower chest: Large right and small left pleural effusions with multi segment atelectasis. The left ventricle may be dilated. Aortic and coronary atherosclerosis. Hepatobiliary: There may be periportal edema. No overt cirrhotic changes.Full gallbladder  without root still fat stranding or calcified choledocholithiasis, unchanged. Pancreas: No focal peripancreatic edema or definite expansion. Spleen: Negative Adrenals/Urinary Tract: Negative adrenals. Numerous and branching right renal calculi with severe right renal cortical atrophy. No hydronephrosis or renal inflammation seen. Chronic large upward bladder diverticulum which partially contains gas today. Bladder is distended to a moderate degree. No detected bladder wall thickening. Stomach/Bowel: No obstruction. No visible bowel inflammation. Short gastric varices are seen. The major portal venous system appears patent. Vascular/Lymphatic: No acute vascular abnormality. Extensive atherosclerosis. No mass or adenopathy. Mild colonic diverticulosis. Reproductive:The prostate region is obscured by streak artifact, there may have been prior prostatectomy. Other: No ascites or pneumoperitoneum. Right lateral hernia which may be postsurgical. There is shallow bulging right-sided colon, as before. Mild retroperitoneal edema, nonspecific. Musculoskeletal: Advanced lumbar spine degeneration. Left hip arthroplasty. IMPRESSION: 1. Large right and small left pleural effusion with multi segment atelectasis. 2. Short gastric varices. 3. Severe right renal atrophy with extensive right nephrolithiasis. 4. Distended bladder with bladder diverticulum. 5. Right posterolateral abdominal wall hernia containing nonobstructive bowel. Electronically Signed   By: Marnee Spring M.D.   On: 09/26/2020 05:50    Assessment/Plan Principal Problem:   Pancreatitis Active Problems:   Nonischemic cardiomyopathy (HCC)   Essential hypertension   Hyperlipidemia   PAF (paroxysmal atrial fibrillation) (HCC)   Hyperkalemia   Chronic renal insufficiency, stage 3 (moderate) (HCC)   Gout   Diabetes mellitus type 2 in obese (HCC)   Transaminitis   Anemia of chronic disease   Malnutrition of moderate degree   Abdominal pain   Open wound  of left heel  Pancreatitis Unclear etiology, patient does not drink.  Check triglyceride level, right upper quadrant ultrasound to rule out gallstones.  Repeat labs in a.m.  Mild IV hydration, n.p.o. for now.  Nonischemic cardiomyopathy EF is 20 to 25% at last echo in September 2021.  He has an implantable AICD.  He is on aggressive diuresis.  Check BNP.  Has large pleural effusion right greater than left.  Given pancreatitis IV fluid hydration and resuscitation will be kept on the minimal side and continue his diuresis.  Strict I's and O's, daily weights, may need some IV diuresis if he becomes worse short of breath.  Continue spironolactone, torsemide, he had previously been on Imdur, hydralazine, metolazone.  These have been held since his September 2021 admission.  His blood pressure remains very low.  Hypertension Has previously been on Imdur, hydralazine and metolazone but it does not appear as blood pressure can tolerate this right now.  Hyperlipidemia Continue Crestor  Paroxysmal A. Fib Continue amiodarone and Eliquis  Hyperkalemia We will give 1 dose of IV Lasix which may help the pleural effusions as well as the potassium.  Chronic renal insufficiency stage III Creatinine stable at 1.41 was 1.47 at discharge yesterday.  Gout Usually on allopurinol and holding this for now.  Diabetes type 2 Per problem list, per wife he was admitted to the [place] several months ago and they told him he was no longer diabetic.  He is on no medications for this at this time. Will monitor CBGs and use sliding scale insulin as needed.  Transaminitis This is not new for this patient although this is worse  today seems to be only his AST that is elevated.  Question if this is some hepatic congestion versus other.  Anemia of chronic disease Continue iron and B12 repletion.  Malnutrition According to wife has had very poor appetite and minimal intake recently.  From his last hospitalization speech  evaluated him and said he should be on a dysphagia 1 diet. Made him n.p.o. due to his pancreatitis but will need to address this going forward.  Open wound to heel Consult to wound and ostomy care.  They saw him last hospitalization and put wound care orders in the nursing instructions.  DVT prophylaxis: DX:AJOINOM Code Status: Full code confirmed with his wife.  Began the discussion of multiple hospitalizations poor nutrition stepwise decline in function.   Family Communication: Wife on the phone.  She has diabetes and lupus and is having more difficulty caring for him at home.  They have run out of their benefits for ongoing skilled nursing care.  Would likely benefit from palliative care consult when and if they are ready for that. Disposition Plan: Pending completed work-up and PT eval. Consults called: None Admission status: Inpatient patient remains inpatient for ongoing work-up and need for IV fluid resuscitation, IV diuretics, IV pain control.   Reva Bores MD [facility]  If 7PM-7AM, please contact night-coverage 09/26/2020, 10:23 AM

## 2020-09-27 DIAGNOSIS — K8591 Acute pancreatitis with uninfected necrosis, unspecified: Secondary | ICD-10-CM | POA: Diagnosis not present

## 2020-09-27 LAB — GLUCOSE, CAPILLARY
Glucose-Capillary: 64 mg/dL — ABNORMAL LOW (ref 70–99)
Glucose-Capillary: 86 mg/dL (ref 70–99)
Glucose-Capillary: 77 mg/dL (ref 70–99)
Glucose-Capillary: 64 mg/dL — ABNORMAL LOW (ref 70–99)
Glucose-Capillary: 97 mg/dL (ref 70–99)
Glucose-Capillary: 92 mg/dL (ref 70–99)
Glucose-Capillary: 90 mg/dL (ref 70–99)

## 2020-09-27 LAB — HEMOGLOBIN A1C
Hgb A1c MFr Bld: 5.2 % (ref 4.8–5.6)
Mean Plasma Glucose: 103 mg/dL

## 2020-09-27 LAB — URINE CULTURE: Culture: NO GROWTH

## 2020-09-27 MED ORDER — DEXTROSE 50 % IV SOLN
INTRAVENOUS | Status: AC
  Administered 2020-09-27: 25 mL
  Filled 2020-09-27: qty 50

## 2020-09-27 MED ORDER — DEXTROSE 50 % IV SOLN
INTRAVENOUS | Status: AC
  Administered 2020-09-27: 12.5 g via INTRAVENOUS
  Filled 2020-09-27: qty 50

## 2020-09-27 MED ORDER — DEXTROSE 50 % IV SOLN: 12.5000 g | INTRAVENOUS | Status: AC

## 2020-09-27 NOTE — Progress Notes (Signed)
Hypoglycemic Event  CBG: 64 at 11:32  Treatment: D50 25 mL (12.5 gm)  Symptoms: None  Follow-up CBG: Time:13:15 CBG Result:97  Possible Reasons for Event: Pt is on NPO  Comments/MD notified: Dr. Wonda Horner Michel Harrow

## 2020-09-27 NOTE — Evaluation (Signed)
Occupational Therapy Evaluation Patient Details Name: Ryan Macdonald MRN: [id number] DOB: 20-Apr-1934 Today's Date: 09/27/2020    History of Present Illness Pt is an 86 y/o male admitted secondary to pancreatitis. Pt with recent admissions for UTI. PMH includes HTN, CHF, s/p AICD, a fib, CKD, gout, DM.    Clinical Impression   Patient with re-admit with the above diagnosis.  He presents with confusion and decreased cognition, generalized weakness, and loose stools all impacting mobility and self care abilities.  He was discharged home after his most recent admit.  Given heavy assist with all self care, toilet, and mobility, SNF is recommended.  OT will follow in the acute setting.      Follow Up Recommendations  SNF;Supervision/Assistance - 24 hour    Equipment Recommendations  Wheelchair cushion (measurements OT);Wheelchair (measurements OT)    Recommendations for Other Services       Precautions / Restrictions Precautions Precautions: Fall;Other (comment);ICD/Pacemaker Precaution Comments: L heel wound Restrictions Weight Bearing Restrictions: No Other Position/Activity Restrictions: ? WB restrictions L LE given extent of heel ulcer      Mobility Bed Mobility Overal bed mobility: Needs Assistance Bed Mobility: Rolling Rolling: Max assist                                                                               ADL either performed or assessed with clinical judgement   ADL Overall ADL's : Needs assistance/impaired         Upper Body Bathing: Maximal assistance   Lower Body Bathing: Total assistance;Bed level   Upper Body Dressing : Maximal assistance   Lower Body Dressing: Total assistance;Bed level                 General ADL Comments: Patient with copious amount of stool - limited out of bed this date.     Vision Baseline Vision/History: Wears glasses Patient Visual Report: No change from  baseline Additional Comments: Seems to have difficulty making eye contact and focusing     Perception     Praxis      Pertinent Vitals/Pain Pain Assessment: No/denies pain     Hand Dominance Right   Extremity/Trunk Assessment Upper Extremity Assessment Upper Extremity Assessment: Generalized weakness;RUE deficits/detail RUE Coordination: decreased gross motor   Lower Extremity Assessment Lower Extremity Assessment: Defer to PT evaluation       Communication Communication Communication: No difficulties   Cognition Arousal/Alertness: Awake/alert   Overall Cognitive Status: History of cognitive impairments - at baseline Area of Impairment: Orientation;Memory;Following commands;Awareness                 Orientation Level: Place;Time;Situation Current Attention Level: Sustained Memory: Decreased short-term memory Following Commands: Follows one step commands inconsistently Safety/Judgement: Decreased awareness of safety;Decreased awareness of deficits Awareness: Intellectual Problem Solving: Slow processing;Decreased initiation;Difficulty sequencing;Requires verbal cues;Requires tactile cues     General Comments       Exercises     Shoulder Instructions      Home Living Family/patient expects to be discharged to:: Private residence Living Arrangements: Spouse/significant other Available Help at Discharge: Family Type of Home: Apartment Home Access: Stairs to enter [employer] of Steps: 8 Entrance Stairs-Rails: Right Home  Layout: One level     Bathroom Shower/Tub: Chief Strategy Officer: Standard Bathroom Accessibility: Yes How Accessible: Accessible via walker Home Equipment: Walker - 2 wheels;Cane - single point          Prior Functioning/Environment Level of Independence: Needs assistance  Gait / Transfers Assistance Needed: Unsure of mobility status at home secondary to cognitive impairment. Patient was unable to  traverse 8 STE apartment. ADL's / Homemaking Assistance Needed: Required assist for all ADLs.    Comments: Poor historian        OT Problem List: Decreased strength;Decreased range of motion;Decreased activity tolerance;Impaired balance (sitting and/or standing);Decreased cognition;Decreased safety awareness;Decreased knowledge of use of DME or AE;Decreased knowledge of precautions      OT Treatment/Interventions: Self-care/ADL training;Therapeutic exercise;Energy conservation;DME and/or AE instruction;Therapeutic activities;Cognitive remediation/compensation;Patient/family education;Balance training    OT Goals(Current goals can be found in the care plan section) Acute Rehab OT Goals Patient Stated Goal: I'm rqdy to start moving. OT Goal Formulation: With patient Time For Goal Achievement: 10/13/20 Potential to Achieve Goals: Fair ADL Goals Pt Will Perform Grooming: with supervision;sitting Pt Will Perform Lower Body Bathing: with min assist;sitting/lateral leans Pt Will Perform Lower Body Dressing: with min assist;sitting/lateral leans Pt Will Transfer to Toilet: with min assist;stand pivot transfer;bedside commode  OT Frequency: Min 2X/week   Barriers to D/C: Decreased caregiver support          Co-evaluation              AM-PAC OT "6 Clicks" Daily Activity     Outcome Measure Help from another person eating meals?: A Little Help from another person taking care of personal grooming?: A Little Help from another person toileting, which includes using toliet, bedpan, or urinal?: Total Help from another person bathing (including washing, rinsing, drying)?: Total Help from another person to put on and taking off regular upper body clothing?: Total Help from another person to put on and taking off regular lower body clothing?: Total 6 Click Score: 10   End of Session Equipment Utilized During Treatment: Oxygen Nurse Communication: Other (comment) (patient with  BM)  Activity Tolerance:   Patient left: in bed;with call bell/phone within reach;with bed alarm set  OT Visit Diagnosis: Other abnormalities of gait and mobility (R26.89);Unsteadiness on feet (R26.81);Muscle weakness (generalized) (M62.81);Other symptoms and signs involving cognitive function                Time: 1414-1430 OT Time Calculation (min): 16 min Charges:  OT General Charges $OT Visit: 1 Visit OT Evaluation $OT Eval Moderate Complexity: 1 Mod  09/27/2020  Rich, OTR/L  Acute Rehabilitation Services  Office:  [phone]   Suzanna Obey 09/27/2020, 2:41 PM

## 2020-09-27 NOTE — Progress Notes (Signed)
10/30 2110 Pt has Ist degree AVB on telemetry. Pt is currently stable. Will continue to monitor.

## 2020-09-27 NOTE — Plan of Care (Addendum)
[facility] called regarding STAT test ordered by MD, RN was informed that they are unable to perform test on a weekend and earliest they could do test is on Monday. MD notified.   Problem: Clinical Measurements: Goal: Ability to maintain clinical measurements within normal limits will improve Outcome: Progressing   Problem: Activity: Goal: Risk for activity intolerance will decrease Outcome: Progressing   Problem: Nutrition: Goal: Adequate nutrition will be maintained Outcome: Progressing   Problem: Coping: Goal: Level of anxiety will decrease Outcome: Progressing   Problem: Elimination: Goal: Will not experience complications related to bowel motility Outcome: Progressing   Problem: Pain Managment: Goal: General experience of comfort will improve Outcome: Progressing   Problem: Safety: Goal: Ability to remain free from injury will improve Outcome: Progressing   Problem: Skin Integrity: Goal: Risk for impaired skin integrity will decrease Outcome: Progressing

## 2020-09-27 NOTE — Progress Notes (Signed)
PROGRESS NOTE    Ryan Macdonald  WNI:[id number] DOB: [birth year] DOA: 09/26/2020 PCP: [facility]  Brief Narrative:  86 year old male HFrEF 20-25% status post Biotronik AICD which is RV paced PCI and LAD stenting in 07/2018 P A. fib CHADS2 diagnosed 2014 after hip repair score >4 on Eliquis Prior pulmonary embolism chronic hypoxic respiratory failure on home on oxygen DM TY 2 CKD 3 a Patient Gout Depression BPH Chronic low back pain Hypothyroid  Recent admission 10/23 through 09/25/2020 sepsis secondary to urinary tract infection-urine culture no growth thus antibiotics started prior to hydrating resolved all 3 acute kidney injury on that admission-cardiology provided some anticipatory guidance regarding medications and was sent home\  Patient readmitted from home with abdominal pain and nausea White count 12.2 hemoglobin 9 potassium 5.4 AST 138 lipase 116 CT scan showed pleural effusion edema varices hernia and bladder distention   Assessment & Plan:   Principal Problem:   Pancreatitis Active Problems:   Nonischemic cardiomyopathy (HCC)   Essential hypertension   Hyperlipidemia   PAF (paroxysmal atrial fibrillation) (HCC)   Hyperkalemia   Chronic renal insufficiency, stage 3 (moderate) (HCC)   Gout   Diabetes mellitus type 2 in obese (HCC)   Transaminitis   Anemia of chronic disease   Malnutrition of moderate degree   Abdominal pain   Open wound of left heel   1. Pancreatitis? a. Unclear if was vomtiing at home or not--vomiting can cause non specific elevation lipase b. Ultrasound abdomen pelvis showing possible thickening of the gallbladder so we will order HIDA scan as per recommendations and may require input from general surgery and/or consideration for cholecystotomy c. Labs are still pending d. Repeat in a.m. 2. Nonischemic cardiomyopathy EF 20-25% based on 08/18/2020 echo a. Continue metoprolol as below 3. P A. fib since 2014 with  Biotronik AICD right lead pacing a. Continue amiodarone 200 daily, spironolactone 12.5 at bedtime and torsemide 60 twice daily b. Continue apixaban 2.5 twice daily for anticoagulation c. Monitor trends and ins and outs, is -1.4 L today 4. HTN a. As per above discussion 5. HLD a. Continue Crestor 10 6. Hyperkalemia 7. Renal insufficiency CKD stage IIIa a. No repeat labs as yet-adjust meds as per renal function b. Avoid toxins if possible 8. DM TY 2 complicated by hypoglycemia a. Home medications do not include any insulin b. Given his hypoglycemia Discontinue sliding scale 9. Transaminitis i. Await repeat labs 10. Anemia chronic disease 11. Wound to right heel a. Continue Prevalon boot, wound care recommending Xeroform gauze to posterior left heel with Kerlix  DVT prophylaxis: Eliquis Code Status: Full Family Communication: None present Disposition:   Status is: Inpatient  Remains inpatient appropriate because:Persistent severe electrolyte disturbances, Ongoing active pain requiring inpatient pain management and Altered mental status   Dispo: The patient is from: Home              Anticipated d/c is to: Home              Anticipated d/c date is: 2 days              Patient currently is not medically stable to d/c.       Consultants:   None yet  Procedures: None  Antimicrobials: No   Subjective: Awake alert slightly confused thinks that he is at home but realizes when I tell him that he is not No chest pain no fever had 1 episode of nausea  Objective: Vitals:   09/26/20  1731 09/26/20 1947 09/27/20 0327 09/27/20 0816  BP: 103/61 122/78 (!) 102/55 (!) 100/52  Pulse: 81 83 75 72  Resp: 17  16 18   Temp: (!) 97.5 F (36.4 C) 98.6 F (37 C) 97.8 F (36.6 C) 98 F (36.7 C)  TempSrc: Oral Oral Oral Oral  SpO2: 96% 98% 98% 99%  Weight:        Intake/Output Summary (Last 24 hours) at 09/27/2020 0831 Last data filed at 09/27/2020 0408 Gross per 24 hour   Intake 92.71 ml  Output 500 ml  Net -407.29 ml   Filed Weights   09/26/20 1241  Weight: 78.8 kg    Examination:  General exam: EOMI NCAT has [place]ck soft supple Abdomen soft no rebound no guarding Neurologically intact moving 4 limbs equally no lower extremity edema Psych euthymic   Data Reviewed: I have personally reviewed following labs and imaging studies BNP 770  Triglycerides 67 CRP 4.9  Radiology Studies: 09/28/20 Abdomen Complete  Result Date: 09/26/2020 CLINICAL DATA:  Abdominal pain, pancreatitis. EXAM: ABDOMEN ULTRASOUND COMPLETE COMPARISON:  None. FINDINGS: Gallbladder: No gallstones. There is diffuse gallbladder wall thickening. No sonographic Murphy sign noted by sonographer. Common bile duct: Diameter: 5.6 mm, within normal limits. Liver: No focal lesion identified. Within normal limits in parenchymal echogenicity. Portal vein is patent on color Doppler imaging with normal direction of blood flow towards the liver. IVC: No abnormality visualized. Pancreas: Limited visualization secondary to overlying bowel gas. Spleen: Size and appearance within normal limits. Right Kidney: Length: 10.5 cm in length. Echogenicity within normal limits. No hydronephrosis. Multiple calculi, the largest measuring 2.1 cm. Left Kidney: Length: 11.9 cm in length. Echogenicity within normal limits. No hydronephrosis. There are 2 cysts, the largest measuring 2.0 cm in the interpolar lateral kidney. Abdominal aorta: No aneurysm visualized. Other findings: None. IMPRESSION: 1. Diffuse gallbladder wall thickening, which may be reactive or relate to hepatic dysfunction or congestive heart failure/volume status given no other evidence of acute cholecystitis (negative Murphy sign and no cholelithiasis). If there is clinical concern for chronic cholecystitis, HIDA scan could further evaluate. 2. Right nephrolithiasis. 3. Limited visualization of the pancreas. Electronically Signed   By: 09/28/2020 MD    On: 09/26/2020 10:15   CT ABDOMEN PELVIS W CONTRAST  Result Date: 09/26/2020 CLINICAL DATA:  Acute, nonlocalized abdominal pain EXAM: CT ABDOMEN AND PELVIS WITH CONTRAST TECHNIQUE: Multidetector CT imaging of the abdomen and pelvis was performed using the standard protocol following bolus administration of intravenous contrast. CONTRAST:  09/28/2020 OMNIPAQUE IOHEXOL 300 MG/ML  SOLN COMPARISON:  03/08/2019 FINDINGS: Lower chest: Large right and small left pleural effusions with multi segment atelectasis. The left ventricle may be dilated. Aortic and coronary atherosclerosis. Hepatobiliary: There may be periportal edema. No overt cirrhotic changes.Full gallbladder without root still fat stranding or calcified choledocholithiasis, unchanged. Pancreas: No focal peripancreatic edema or definite expansion. Spleen: Negative Adrenals/Urinary Tract: Negative adrenals. Numerous and branching right renal calculi with severe right renal cortical atrophy. No hydronephrosis or renal inflammation seen. Chronic large upward bladder diverticulum which partially contains gas today. Bladder is distended to a moderate degree. No detected bladder wall thickening. Stomach/Bowel: No obstruction. No visible bowel inflammation. Short gastric varices are seen. The major portal venous system appears patent. Vascular/Lymphatic: No acute vascular abnormality. Extensive atherosclerosis. No mass or adenopathy. Mild colonic diverticulosis. Reproductive:The prostate region is obscured by streak artifact, there may have been prior prostatectomy. Other: No ascites or pneumoperitoneum. Right lateral hernia which may be postsurgical. There is  shallow bulging right-sided colon, as before. Mild retroperitoneal edema, nonspecific. Musculoskeletal: Advanced lumbar spine degeneration. Left hip arthroplasty. IMPRESSION: 1. Large right and small left pleural effusion with multi segment atelectasis. 2. Short gastric varices. 3. Severe right renal atrophy  with extensive right nephrolithiasis. 4. Distended bladder with bladder diverticulum. 5. Right posterolateral abdominal wall hernia containing nonobstructive bowel. Electronically Signed   By: Marnee Spring M.D.   On: 09/26/2020 05:50     Scheduled Meds: . amiodarone  200 mg Oral Daily  . apixaban  2.5 mg Oral BID  . feeding supplement  237 mL Oral BID BM  . finasteride  5 mg Oral BID  . insulin aspart  0-9 Units Subcutaneous TID WC  . iron polysaccharides  150 mg Oral Daily  . levothyroxine  125 mcg Oral QAC breakfast  . lidocaine  1 patch Transdermal Q24H  . multivitamin with minerals  1 tablet Oral Daily  . polyethylene glycol  17 g Oral BID  . saccharomyces boulardii  250 mg Oral BID  . senna-docusate  1 tablet Oral BID  . sertraline  25 mg Oral Daily  . spironolactone  12.5 mg Oral QHS  . torsemide  60 mg Oral BID  . cyanocobalamin  1,000 mcg Oral Daily   Continuous Infusions: . lactated ringers 50 mL/hr at 09/26/20 1032     LOS: 1 day    Time spent: 77  Rhetta Mura, MD [facility] To contact the attending provider between 7A-7P or the covering provider during after hours 7P-7A, please log into the web site [email or web address] and access using universal [place] password for that web site. If you do not have the password, please call the hospital operator.  09/27/2020, 8:31 AM

## 2020-09-28 DIAGNOSIS — K8591 Acute pancreatitis with uninfected necrosis, unspecified: Secondary | ICD-10-CM | POA: Diagnosis not present

## 2020-09-28 LAB — CBC WITH DIFFERENTIAL/PLATELET
Abs Immature Granulocytes: 0.06 10*3/uL (ref 0.00–0.07)
Basophils Absolute: 0 10*3/uL (ref 0.0–0.1)
Basophils Relative: 0 %
Eosinophils Absolute: 0.2 10*3/uL (ref 0.0–0.5)
Eosinophils Relative: 2 %
HCT: 22.9 % — ABNORMAL LOW (ref 39.0–52.0)
Hemoglobin: 7.5 g/dL — ABNORMAL LOW (ref 13.0–17.0)
Immature Granulocytes: 1 %
Lymphocytes Relative: 18 %
Lymphs Abs: 2 10*3/uL (ref 0.7–4.0)
MCH: 31.5 pg (ref 26.0–34.0)
MCHC: 32.8 g/dL (ref 30.0–36.0)
MCV: 96.2 fL (ref 80.0–100.0)
Monocytes Absolute: 0.8 10*3/uL (ref 0.1–1.0)
Monocytes Relative: 8 %
Neutro Abs: 7.7 10*3/uL (ref 1.7–7.7)
Neutrophils Relative %: 71 %
Platelets: 235 10*3/uL (ref 150–400)
RBC: 2.38 MIL/uL — ABNORMAL LOW (ref 4.22–5.81)
RDW: 24.6 % — ABNORMAL HIGH (ref 11.5–15.5)
WBC: 10.7 10*3/uL — ABNORMAL HIGH (ref 4.0–10.5)
nRBC: 0 % (ref 0.0–0.2)

## 2020-09-28 LAB — COMPREHENSIVE METABOLIC PANEL
ALT: 57 U/L — ABNORMAL HIGH (ref 0–44)
AST: 168 U/L — ABNORMAL HIGH (ref 15–41)
Albumin: 1.7 g/dL — ABNORMAL LOW (ref 3.5–5.0)
Alkaline Phosphatase: 151 U/L — ABNORMAL HIGH (ref 38–126)
Anion gap: 11 (ref 5–15)
BUN: 29 mg/dL — ABNORMAL HIGH (ref 8–23)
CO2: 23 mmol/L (ref 22–32)
Calcium: 8.6 mg/dL — ABNORMAL LOW (ref 8.9–10.3)
Chloride: 106 mmol/L (ref 98–111)
Creatinine, Ser: 1.72 mg/dL — ABNORMAL HIGH (ref 0.61–1.24)
GFR, Estimated: 39 mL/min — ABNORMAL LOW (ref >60)
Glucose, Bld: 102 mg/dL — ABNORMAL HIGH (ref 70–99)
Potassium: 4.8 mmol/L (ref 3.5–5.1)
Sodium: 140 mmol/L (ref 135–145)
Total Bilirubin: 3.2 mg/dL — ABNORMAL HIGH (ref 0.3–1.2)
Total Protein: 6.3 g/dL — ABNORMAL LOW (ref 6.5–8.1)

## 2020-09-28 LAB — GLUCOSE, CAPILLARY
Glucose-Capillary: 84 mg/dL (ref 70–99)
Glucose-Capillary: 101 mg/dL — ABNORMAL HIGH (ref 70–99)
Glucose-Capillary: 86 mg/dL (ref 70–99)
Glucose-Capillary: 91 mg/dL (ref 70–99)

## 2020-09-28 LAB — LIPASE, BLOOD: Lipase: 122 U/L — ABNORMAL HIGH (ref 11–51)

## 2020-09-28 NOTE — Plan of Care (Signed)
  Problem: Clinical Measurements: Goal: Ability to maintain clinical measurements within normal limits will improve Outcome: Progressing   Problem: Activity: Goal: Risk for activity intolerance will decrease Outcome: Progressing   Problem: Coping: Goal: Level of anxiety will decrease Outcome: Progressing   Problem: Elimination: Goal: Will not experience complications related to bowel motility Outcome: Progressing   Problem: Pain Managment: Goal: General experience of comfort will improve Outcome: Progressing   Problem: Safety: Goal: Ability to remain free from injury will improve Outcome: Progressing   Problem: Skin Integrity: Goal: Risk for impaired skin integrity will decrease Outcome: Progressing   

## 2020-09-28 NOTE — Consult Note (Signed)
Re:   Ryan Macdonald DOB:   [birth year] MRN:   [id number]  ASSESEMENT AND PLAN: 1.  Possible cholecystitis - no stones on imaging  [place] - 09/26/2020 - 1. Diffuse gallbladder wall thickening, which may be reactive or relate to hepatic dysfunction or congestive heart failure/volume status given no other evidence of acute cholecystitis (negative Murphy sign and no cholelithiasis). If there is clinical concern for chronic cholecystitis, HIDA scan could further evaluate.    2. Right nephrolithiasis.    3. Limited visualization of the pancreas.  PE is unremarkable, no gall stones on [place] - he does not clinically have cholecystitis.  Can proceed with HIDA scan or not.  If his HIDA scan is positive, he'll probably need a perc drain.  2.  Lipase - 116  Not sure he had pancreatitis or just from his chronic diseases  3.  Nonischemic cardiomyopathy  History of CHF  History of coronary stent - 2019 4.  A fib  On Eliquis 5.  Anticoagulated on Eliquis  6.  DM  HgbA1C - 5.2 - 09/26/2020 7.  Anemia 8.  Pleural effusions - R>L 9.  Right nephrolithiasis/atrophic kidney 10.  Right flank hernia from prior right flank surgery  Chief Complaint  Patient presents with  . Abdominal Pain   PHYSICIAN REQUESTING CONSULTATION: [facility]  HISTORY OF PRESENT ILLNESS: Ryan Macdonald is a 86 y.o. (DOB: 1934-01-25)  AA male whose primary care physician is [facility] [employer].   He is mildly confused and is a poor historian.  He was just hospitalized form 10/23 - 09/25/2020 for a UTI and acute kidney injury.  He had been in rehab, but could not afford this, so he was home.  He had some vomiting (?) and abdominal pain and was brought to [facility] ER by EMS.   Past Medical History:  Diagnosis Date  . Acute bronchitis 05/10/2017  . Acute respiratory failure (HCC)   . Acute respiratory failure with hypoxia (HCC) 09/27/2017  . AKI (acute kidney injury) (HCC) 02/2019  . AKI (acute kidney  injury) (HCC) 03/14/2019  . Atrial fibrillation (HCC)   . CAP (community acquired pneumonia) 02/03/2017  . Cardiomyopathy (HCC)   . Cat scratch fever    removed mass on right side neck  . Cataracts, bilateral   . CHF (congestive heart failure) (HCC)   . CHF exacerbation (HCC) 09/27/2017  . Chronic kidney disease   . Corns and callosities   . Diabetes mellitus without complication (HCC)    TYPE 2  . Dyspnea   . Erythema intertrigo   . Flat foot   . Gout   . High cholesterol   . Hx of urethral stricture   . Hypertension   . Kidney stones   . Morbid obesity (HCC)   . Nonischemic cardiomyopathy (HCC)    a. ? 2009 Cath in [place] - nl cors per pt;  b. 09/2013 Echo: EF 25-30%, sev diff HK.  . Obesity   . Osteoarthritis   . PAF (paroxysmal atrial fibrillation) (HCC)    a. post-op hip in 2014 - prev on xarelto.  . Renal insufficiency       Past Surgical History:  Procedure Laterality Date  . CARDIAC CATHETERIZATION  1980's  . CARDIOVERSION N/A 01/15/2020   Procedure: CARDIOVERSION;  Surgeon: Laurey Morale, MD;  Location: [facility] ENDOSCOPY;  Service: Cardiovascular;  Laterality: N/A;  . CIRCUMCISION  1940's  . CORONARY STENT INTERVENTION N/A 08/18/2018   Procedure: CORONARY  STENT INTERVENTION;  Surgeon: Corky Crafts, MD;  Location: [facility] INVASIVE CV LAB;  Service: Cardiovascular;  Laterality: N/A;  . CYSTOSCOPY WITH URETHRAL DILATATION  10/23/2013  . CYSTOSCOPY WITH URETHRAL DILATATION N/A 10/23/2013   Procedure: CYSTOSCOPY WITH URETHRAL DILATATION;  Surgeon: Kathi Ludwig, MD;  Location: [facility] OR;  Service: Urology;  Laterality: N/A;  . INCISION AND DRAINAGE OF WOUND Right 1980's   "cat scratch" (10/23/2013)  . INGUINAL HERNIA REPAIR Right 1950's  . INTRAVASCULAR ULTRASOUND/IVUS N/A 08/18/2018   Procedure: Intravascular Ultrasound/IVUS;  Surgeon: Corky Crafts, MD;  Location: [facility] INVASIVE CV LAB;  Service: Cardiovascular;  Laterality: N/A;  . KIDNEY STONE SURGERY Right  1970's; 1983   "twice"  . LEFT HEART CATH AND CORONARY ANGIOGRAPHY N/A 08/18/2018   Procedure: LEFT HEART CATH AND CORONARY ANGIOGRAPHY;  Surgeon: Corky Crafts, MD;  Location: [facility] INVASIVE CV LAB;  Service: Cardiovascular;  Laterality: N/A;  . RIGHT HEART CATH N/A 02/19/2020   Procedure: RIGHT HEART CATH;  Surgeon: Laurey Morale, MD;  Location: [facility] INVASIVE CV LAB;  Service: Cardiovascular;  Laterality: N/A;  . TEE WITHOUT CARDIOVERSION N/A 01/15/2020   Procedure: TRANSESOPHAGEAL ECHOCARDIOGRAM (TEE);  Surgeon: Laurey Morale, MD;  Location: [facility] ENDOSCOPY;  Service: Cardiovascular;  Laterality: N/A;  . TONSILLECTOMY AND ADENOIDECTOMY  1940's  . TOTAL HIP ARTHROPLASTY Left 10/23/2013   Procedure: TOTAL HIP ARTHROPLASTY;  Surgeon: Valeria Batman, MD;  Location: [facility] OR;  Service: Orthopedics;  Laterality: Left;      Current Facility-Administered Medications  Medication Dose Route Frequency Provider Last Rate Last Admin  . amiodarone (PACERONE) tablet 200 mg  200 mg Oral Daily Reva Bores, MD   200 mg at 09/28/20 0855  . apixaban (ELIQUIS) tablet 2.5 mg  2.5 mg Oral BID Reva Bores, MD   2.5 mg at 09/28/20 0854  . benzonatate (TESSALON) capsule 100 mg  100 mg Oral TID PRN Reva Bores, MD      . diclofenac Sodium (VOLTAREN) 1 % topical gel 4 g  4 g Topical BID PRN Reva Bores, MD      . feeding supplement (ENSURE ENLIVE / ENSURE PLUS) liquid 237 mL  237 mL Oral BID BM Reva Bores, MD   237 mL at 09/28/20 0900  . fentaNYL (SUBLIMAZE) injection 12.5-50 mcg  12.5-50 mcg Intravenous Q2H PRN Reva Bores, MD      . finasteride (PROSCAR) tablet 5 mg  5 mg Oral BID Reva Bores, MD   5 mg at 09/28/20 0854  . HYDROcodone-acetaminophen (NORCO/VICODIN) 5-325 MG per tablet 1 tablet  1 tablet Oral TID PRN Reva Bores, MD      . ipratropium-albuterol (DUONEB) 0.5-2.5 (3) MG/3ML nebulizer solution 3 mL  3 mL Nebulization Q4H PRN Reva Bores, MD      . iron polysaccharides  (NIFEREX) capsule 150 mg  150 mg Oral Daily Reva Bores, MD   150 mg at 09/28/20 0856  . lactated ringers infusion   Intravenous Continuous Reva Bores, MD 50 mL/hr at 09/27/20 1846 Rate Verify at 09/27/20 1846  . levothyroxine (SYNTHROID) tablet 125 mcg  125 mcg Oral QAC breakfast Reva Bores, MD   125 mcg at 09/28/20 [phone]  . lidocaine (LIDODERM) 5 % 1 patch  1 patch Transdermal Q24H Reva Bores, MD      . metoprolol tartrate (LOPRESSOR) injection 5 mg  5 mg Intravenous Q6H PRN Reva Bores, MD      .  multivitamin with minerals tablet 1 tablet  1 tablet Oral Daily Reva Bores, MD   1 tablet at 09/28/20 0854  . ondansetron (ZOFRAN) tablet 4 mg  4 mg Oral Q6H PRN Reva Bores, MD       Or  . ondansetron [facility]) injection 4 mg  4 mg Intravenous Q6H PRN Reva Bores, MD      . polyethylene glycol (MIRALAX / GLYCOLAX) packet 17 g  17 g Oral BID Reva Bores, MD   17 g at 09/27/20 2147  . saccharomyces boulardii (FLORASTOR) capsule 250 mg  250 mg Oral BID Reva Bores, MD   250 mg at 09/28/20 0854  . senna-docusate (Senokot-S) tablet 1 tablet  1 tablet Oral BID Reva Bores, MD   1 tablet at 09/27/20 2147  . sertraline (ZOLOFT) tablet 25 mg  25 mg Oral Daily Reva Bores, MD   25 mg at 09/28/20 0854  . spironolactone (ALDACTONE) tablet 12.5 mg  12.5 mg Oral QHS Reva Bores, MD   12.5 mg at 09/27/20 2147  . torsemide (DEMADEX) tablet 60 mg  60 mg Oral BID Reva Bores, MD   60 mg at 09/28/20 0855  . traZODone (DESYREL) tablet 25 mg  25 mg Oral QHS PRN Reva Bores, MD      . vitamin B-12 (CYANOCOBALAMIN) tablet 1,000 mcg  1,000 mcg Oral Daily Reva Bores, MD   1,000 mcg at 09/28/20 2951      Allergies  Allergen Reactions  . Lisinopril Swelling    Facial swelling  . Xarelto [Rivaroxaban] Other (See Comments)    "Blood came out of my penis"    SOCIAL and FAMILY HISTORY: Married.  PHYSICAL EXAM: BP 104/60 (BP Location: Right Arm)   Pulse 87   Temp 97.9  F (36.6 C) (Oral)   Resp 18   Wt 78.8 kg   SpO2 98%   BMI 27.21 kg/m   General: Older AA M who is alert.  Skin:  Inspection and palpation - no mass or rash. Eyes:  Conjunctiva and lids unremarkable.            Pupils are equal Ears, Nose, Mouth, and Throat:  Ears and nose unremarkable            Lips and teeth are unremarable. Neck: Supple. No mass, trachea midline.  No thyroid mass. Lymph Nodes:  No supraclavicular, cervical, or inguinal nodes. Lungs: Normal respiratory effort.  Clear to auscultation and symmetric breath sounds. Heart:  RRR.   Abdomen: Soft. No mass. No tenderness.   He has a posterior right flank scar - probably for right kidney surgery and has a hernia at this incision.   Normal bowel sounds.    Musculoskeletal:  Left foot in boot - says that he has gout  DATA REVIEWED, COUNSELING AND COORDINATION OF CARE: Epic notes reviewed. I have personally seen and evaluated the patient, evaluated laboratory and imaging results, formulated the assessment and plan and placed orders. This requires high medical decision making. Total time spent with patient and charting: 40 minutes  Ovidio Kin, MD,  [facility], [place] [address]  [address]   [place], [place]    [address] Phone:  [phone] FAX:  [phone]

## 2020-09-28 NOTE — Progress Notes (Signed)
Consult received for cholecystitis. Recommend HIDA, which has already been ordered and if positive, would recommend cholecystostomy tube placement. Full consult note to follow.  Diamantina Monks, MD General and Trauma Surgery [facility]

## 2020-09-28 NOTE — Progress Notes (Signed)
PROGRESS NOTE    Ryan Macdonald  FBX:[id number] DOB: 08-08-1934 DOA: 09/26/2020 PCP: [facility]  Brief Narrative:  86 year old male HFrEF 20-25% status post Biotronik AICD which is RV paced PCI and LAD stenting in 07/2018 P A. fib CHADS2 diagnosed 2014 after hip repair score >4 on Eliquis Prior pulmonary embolism chronic hypoxic respiratory failure on home on oxygen DM TY 2 CKD 3 a Patient Gout Depression BPH Chronic low back pain Hypothyroid  Recent admission 10/23 through 09/25/2020 sepsis secondary to urinary tract infection-urine culture no growth thus antibiotics started prior to hydrating resolved acute kidney injury on that admission-cardiology provided some anticipatory guidance regarding medications and was sent home  Patient readmitted from home with abdominal pain and nausea White count 12.2 hemoglobin 9 potassium 5.4 AST 138 lipase 116 CT scan showed pleural effusion edema varices hernia and bladder distention   Assessment & Plan:   Principal Problem:   Pancreatitis Active Problems:   Nonischemic cardiomyopathy (HCC)   Essential hypertension   Hyperlipidemia   PAF (paroxysmal atrial fibrillation) (HCC)   Hyperkalemia   Chronic renal insufficiency, stage 3 (moderate) (HCC)   Gout   Diabetes mellitus type 2 in obese (HCC)   Transaminitis   Anemia of chronic disease   Malnutrition of moderate degree   Abdominal pain   Open wound of left heel   1. Pancreatitis? a. Unclear if was vomtiing at home or not--vomiting can cause non specific elevation lipase b. Lipase however is still up with mild trasnaminitis c. Ultrasound abdomen pelvis showing possible thickening of the gallbladder  d. HIDA scan cannot be performed until 11/1-General surgery consulted and if HIDA is positive will need cholecystotomy so I will place an order for IR to also evaluate 2. Nonischemic cardiomyopathy EF 20-25% based on 08/18/2020 echo a. Continue metoprolol IV as  below 3. P A. fib since 2014 with Biotronik AICD right lead pacing a. Continue amiodarone 200 daily, spironolactone 12.5 at bedtime and torsemide 60 twice daily b. Continue apixaban 2.5 twice daily for anticoagulation c. Monitor trends and ins and outs, is -1.0 L today 4. HTN a. As per above discussion 5. HLD a. Continue Crestor 10 6. Hyperkalemia 7. Renal insufficiency CKD stage IIIa a. adjust meds as per renal function b. Avoid toxins if possible 8. DM TY 2 complicated by hypoglycemia a. Home medications do not include any insulin b. Given his hypoglycemia Discontinue sliding scale 9. Transaminitis a. Await repeat labs 10. Anemia chronic disease 11. Wound to right heel a. Continue Prevalon boot, wound care recommending Xeroform gauze to posterior left heel with Kerlix  DVT prophylaxis: Eliquis Code Status: Full Family Communication: None present Disposition:   Status is: Inpatient  Remains inpatient appropriate because:Persistent severe electrolyte disturbances, Ongoing active pain requiring inpatient pain management and Altered mental status   Dispo: The patient is from: Home              Anticipated d/c is to: Home              Anticipated d/c date is: 2 days              Patient currently is not medically stable to d/c.       Consultants:   None yet  Procedures: None  Antimicrobials: No   Subjective:  Some better no distress  No n/v eatign More coherent but still poor historian  Objective: Vitals:   09/27/20 1851 09/27/20 1923 09/28/20 0543 09/28/20 0806  BP: Marland Kitchen)  96/52 (!) 100/53 (!) 103/52 104/60  Pulse: 81 79 83 87  Resp: 16 16 15 18  Temp: 98.5 F (36.9 C) 98.3 F (36.8 C) 98.1 F (36.7 C) 97.9 F (36.6 C)  TempSrc: Oral Oral Oral Oral  SpO2: 99% 99% 97% 98%  Weight:        Intake/Output Summary (Last 24 hours) at 09/28/2020 0925 Last data filed at 09/28/2020 0400 Gross per 24 hour  Intake 1299.05 ml  Output 1950 ml  Net -650.95 ml    Filed Weights   09/26/20 1241  Weight: 78.8 kg    Examination:  General exam: EOMI NCAT Neck soft supple Abdomen soft no rebound no guarding Neurologically intact moving 4 limbs equally no lower extremity edema Psych euthymic   Data Reviewed: I have personally reviewed following labs and imaging studies  k 5.4-->4.8 Bun/cr 25/1.4-->29/1.7 Lipase 122, alk p 122 ast 168/ alt 57, Bili 3.2 Wbc 12.2-->10.7   Triglycerides 67 CRP 4.9  Radiology Studies: US Abdomen Complete  Result Date: 09/26/2020 CLINICAL DATA:  Abdominal pain, pancreatitis. EXAM: ABDOMEN ULTRASOUND COMPLETE COMPARISON:  None. FINDINGS: Gallbladder: No gallstones. There is diffuse gallbladder wall thickening. No sonographic Murphy sign noted by sonographer. Common bile duct: Diameter: 5.6 mm, within normal limits. Liver: No focal lesion identified. Within normal limits in parenchymal echogenicity. Portal vein is patent on color Doppler imaging with normal direction of blood flow towards the liver. IVC: No abnormality visualized. Pancreas: Limited visualization secondary to overlying bowel gas. Spleen: Size and appearance within normal limits. Right Kidney: Length: 10.5 cm in length. Echogenicity within normal limits. No hydronephrosis. Multiple calculi, the largest measuring 2.1 cm. Left Kidney: Length: 11.9 cm in length. Echogenicity within normal limits. No hydronephrosis. There are 2 cysts, the largest measuring 2.0 cm in the interpolar lateral kidney. Abdominal aorta: No aneurysm visualized. Other findings: None. IMPRESSION: 1. Diffuse gallbladder wall thickening, which may be reactive or relate to hepatic dysfunction or congestive heart failure/volume status given no other evidence of acute cholecystitis (negative Murphy sign and no cholelithiasis). If there is clinical concern for chronic cholecystitis, HIDA scan could further evaluate. 2. Right nephrolithiasis. 3. Limited visualization of the pancreas.  Electronically Signed   By: Frederick S Jones MD   On: 09/26/2020 10:15     Scheduled Meds: . amiodarone  200 mg Oral Daily  . apixaban  2.5 mg Oral BID  . feeding supplement  237 mL Oral BID BM  . finasteride  5 mg Oral BID  . iron polysaccharides  150 mg Oral Daily  . levothyroxine  125 mcg Oral QAC breakfast  . lidocaine  1 patch Transdermal Q24H  . multivitamin with minerals  1 tablet Oral Daily  . polyethylene glycol  17 g Oral BID  . saccharomyces boulardii  250 mg Oral BID  . senna-docusate  1 tablet Oral BID  . sertraline  25 mg Oral Daily  . spironolactone  12.5 mg Oral QHS  . torsemide  60 mg Oral BID  . cyanocobalamin  1,000 mcg Oral Daily   Continuous Infusions: . lactated ringers 50 mL/hr at 09/27/20 1846     LOS: 2 days    Time spent: 35  Jai-Gurmukh Samtani, MD [facility] To contact the attending provider between 7A-7P or the covering provider during after hours 7P-7A, please log into the web site [email or web address] and access using universal [place] password for that web site. If you do not have the password, please call the hospital operator.  09/28/2020, 9:25   AM    

## 2020-09-29 ENCOUNTER — Inpatient Hospital Stay (HOSPITAL_COMMUNITY): Payer: No Typology Code available for payment source

## 2020-09-29 DIAGNOSIS — R109 Unspecified abdominal pain: Secondary | ICD-10-CM | POA: Diagnosis not present

## 2020-09-29 LAB — COMPREHENSIVE METABOLIC PANEL
ALT: 49 U/L — ABNORMAL HIGH (ref 0–44)
AST: 129 U/L — ABNORMAL HIGH (ref 15–41)
Albumin: 1.6 g/dL — ABNORMAL LOW (ref 3.5–5.0)
Alkaline Phosphatase: 134 U/L — ABNORMAL HIGH (ref 38–126)
Anion gap: 9 (ref 5–15)
BUN: 29 mg/dL — ABNORMAL HIGH (ref 8–23)
CO2: 24 mmol/L (ref 22–32)
Calcium: 8.6 mg/dL — ABNORMAL LOW (ref 8.9–10.3)
Chloride: 106 mmol/L (ref 98–111)
Creatinine, Ser: 1.68 mg/dL — ABNORMAL HIGH (ref 0.61–1.24)
GFR, Estimated: 40 mL/min — ABNORMAL LOW (ref >60)
Glucose, Bld: 79 mg/dL (ref 70–99)
Potassium: 3.6 mmol/L (ref 3.5–5.1)
Sodium: 139 mmol/L (ref 135–145)
Total Bilirubin: 2.9 mg/dL — ABNORMAL HIGH (ref 0.3–1.2)
Total Protein: 6.5 g/dL (ref 6.5–8.1)

## 2020-09-29 LAB — CBC WITH DIFFERENTIAL/PLATELET
Abs Immature Granulocytes: 0.04 10*3/uL (ref 0.00–0.07)
Basophils Absolute: 0 10*3/uL (ref 0.0–0.1)
Basophils Relative: 0 %
Eosinophils Absolute: 0.2 10*3/uL (ref 0.0–0.5)
Eosinophils Relative: 2 %
HCT: 23.8 % — ABNORMAL LOW (ref 39.0–52.0)
Hemoglobin: 8 g/dL — ABNORMAL LOW (ref 13.0–17.0)
Immature Granulocytes: 0 %
Lymphocytes Relative: 18 %
Lymphs Abs: 1.7 10*3/uL (ref 0.7–4.0)
MCH: 31.6 pg (ref 26.0–34.0)
MCHC: 33.6 g/dL (ref 30.0–36.0)
MCV: 94.1 fL (ref 80.0–100.0)
Monocytes Absolute: 0.8 10*3/uL (ref 0.1–1.0)
Monocytes Relative: 9 %
Neutro Abs: 6.5 10*3/uL (ref 1.7–7.7)
Neutrophils Relative %: 71 %
Platelets: 229 10*3/uL (ref 150–400)
RBC: 2.53 MIL/uL — ABNORMAL LOW (ref 4.22–5.81)
RDW: 23.9 % — ABNORMAL HIGH (ref 11.5–15.5)
WBC: 9.2 10*3/uL (ref 4.0–10.5)
nRBC: 0 % (ref 0.0–0.2)

## 2020-09-29 LAB — GLUCOSE, CAPILLARY
Glucose-Capillary: 79 mg/dL (ref 70–99)
Glucose-Capillary: 74 mg/dL (ref 70–99)
Glucose-Capillary: 73 mg/dL (ref 70–99)
Glucose-Capillary: 122 mg/dL — ABNORMAL HIGH (ref 70–99)
Glucose-Capillary: 63 mg/dL — ABNORMAL LOW (ref 70–99)
Glucose-Capillary: 98 mg/dL (ref 70–99)

## 2020-09-29 LAB — LIPASE, BLOOD: Lipase: 92 U/L — ABNORMAL HIGH (ref 11–51)

## 2020-09-29 MED ORDER — DEXTROSE 50 % IV SOLN
INTRAVENOUS | Status: AC
  Administered 2020-09-29: 25 mL
  Filled 2020-09-29: qty 50

## 2020-09-29 MED ORDER — TECHNETIUM TC 99M MEBROFENIN IV KIT
5.3000 | PACK | Freq: Once | INTRAVENOUS | Status: AC | PRN
  Administered 2020-09-29: 5.3 via INTRAVENOUS

## 2020-09-29 MED ORDER — DEXTROSE 5 % IV SOLN: INTRAVENOUS | Status: DC

## 2020-09-29 NOTE — Progress Notes (Signed)
HIDA negative for acute cholecystitis, do not recommend cholecystectomy. We will sign off, please call with questions.  Franne Forts, PA-C [facility] Surgery 09/29/2020, 4:07 PM Please see Amion for pager number during day hours 7:00am-4:30pm

## 2020-09-29 NOTE — Plan of Care (Signed)

## 2020-09-29 NOTE — Progress Notes (Signed)
       Subjective: Denies abdominal pain.  Has been eating ok.  Denies N/V.  States he has never had abdominal pain.  ROS: See above, otherwise other systems negative  Objective: Vital signs in last 24 hours: Temp:  [97.5 F (36.4 C)-98.7 F (37.1 C)] 97.5 F (36.4 C) (11/01 0747) Pulse Rate:  [79-89] 80 (11/01 0747) Resp:  [15-16] 15 (11/01 0747) BP: (97-113)/(52-60) 106/57 (11/01 0747) SpO2:  [95 %-99 %] 95 % (11/01 0747) Last BM Date: 09/27/20  Intake/Output from previous day: 10/31 0701 - 11/01 0700 In: 2024.8 [P.O.:880; I.V.:1144.8] Out: 750 [Urine:750] Intake/Output this shift: Total I/O In: 258.8 [I.V.:258.8] Out: 1100 [Urine:1100]  PE: Heart: regular Lungs: CTAB Abd: soft, NT, ND, +BS  Lab Results:  Recent Labs    09/28/20 1117 09/29/20 0337  WBC 10.7* 9.2  HGB 7.5* 8.0*  HCT 22.9* 23.8*  PLT 235 229   BMET Recent Labs    09/28/20 1117 09/29/20 0337  NA 140 139  K 4.8 3.6  CL 106 106  CO2 23 24  GLUCOSE 102* 79  BUN 29* 29*  CREATININE 1.72* 1.68*  CALCIUM 8.6* 8.6*   PT/INR No results for input(s): LABPROT, INR in the last 72 hours. CMP     Component Value Date/Time   NA 139 09/29/2020 0337   NA 142 08/01/2018 1404   K 3.6 09/29/2020 0337   CL 106 09/29/2020 0337   CO2 24 09/29/2020 0337   GLUCOSE 79 09/29/2020 0337   BUN 29 (H) 09/29/2020 0337   BUN 24 08/01/2018 1404   CREATININE 1.68 (H) 09/29/2020 0337   CREATININE 1.68 (H) 05/28/2016 1017   CALCIUM 8.6 (L) 09/29/2020 0337   PROT 6.5 09/29/2020 0337   ALBUMIN 1.6 (L) 09/29/2020 0337   AST 129 (H) 09/29/2020 0337   ALT 49 (H) 09/29/2020 0337   ALKPHOS 134 (H) 09/29/2020 0337   BILITOT 2.9 (H) 09/29/2020 0337   GFRNONAA 40 (L) 09/29/2020 0337   GFRAA 49 (L) 08/21/2020 0441   Lipase     Component Value Date/Time   LIPASE 92 (H) 09/29/2020 0337       Studies/Results: No results found.  Anti-infectives: Anti-infectives (From admission, onward)   None        Assessment/Plan R>L pleural effusions A fib - on eliquis, this would need to be held in setting of possible OR vs perc chole drain Elevated lipase - 116, doubt this is really pancreatitis as he has never had abdominal pain or concern for this on imaging DM Anemia  Elevated LFTs -no cholelithiasis noted on imagine.  He has some diffuse wall thickening but no other signs of infection such as fever, leukocytosis, pain, or other symptoms c/w cholecystitis.  This is likely secondary to other chronic medical problems -currently has a HIDA scan pending.  Will await this test to determine further plan of care.  FEN - NPO for HIDA VTE - eliquis, which will need to be help if he needs surgery vs IR ID - none   LOS: 3 days    Letha Cape , [facility] Surgery 09/29/2020, 10:24 AM Please see Amion for pager number during day hours 7:00am-4:30pm or 7:00am -11:30am on weekends

## 2020-09-29 NOTE — Progress Notes (Addendum)
  Hida neg today Rec: no need perc chole  Will cancel order for percutaneous cholecystostomy drain  Discussed with Dr Grace Isaac

## 2020-09-29 NOTE — Progress Notes (Signed)
Hypoglycemic Event  CBG: 63  Treatment: D50 25 mL (12.5 gm)  Symptoms: Asymptomatic  Follow-up CBG: Time:1606 CBG Result:122  Possible Reasons for Event: Pt is NPO  Comments/MD notified:Dr. Hongalgi / Received and carried out new orders    [facility] M Ryan Macdonald

## 2020-09-29 NOTE — Progress Notes (Signed)
PROGRESS NOTE   Ryan Macdonald  ZOX:[id number]    DOB: 11-13-1934    DOA: 09/26/2020  PCP: [facility]   I have briefly reviewed patients previous medical records in [facility].  Chief Complaint  Patient presents with  . Abdominal Pain    Brief Narrative:  86 year old male with PMH of chronic systolic CHF (LVEF 20-25%, AICD in place), paroxysmal A. fib on amiodarone and Eliquis, hypertension, stage IIIb chronic kidney disease, hyperlipidemia, gout, hypothyroidism, recent sepsis related to UTI, discharged home on 10/28 following admission for presumed UTI, presented with abdominal pain.  In the ED, mildly elevated AST/138 and lipase 116, CT abdomen showed right greater than left pleural effusion, periportal edema, renal stones, gastric varices, right hernia and bladder distention.  Admitted for presumed pancreatitis.   Assessment & Plan:  Principal Problem:   Pancreatitis Active Problems:   Nonischemic cardiomyopathy (HCC)   Essential hypertension   Hyperlipidemia   PAF (paroxysmal atrial fibrillation) (HCC)   Hyperkalemia   Chronic renal insufficiency, stage 3 (moderate) (HCC)   Gout   Diabetes mellitus type 2 in obese (HCC)   Transaminitis   Anemia of chronic disease   Malnutrition of moderate degree   Abdominal pain   Open wound of left heel   Abdominal pain, unclear etiology. Initial lipase 116, AST 138.  Abdominal CT nonrevealing for cause.  Abdominal ultrasound showed possible thickening of the gallbladder.  General surgery consulted and clinically not cholecystitis.  HIDA scan negative.  General surgery signed off.  Lipase better.  Initiate diet and monitor.  Etiology of his abnormal LFTs is unclear.  Follow CMP in a.m.  If persistent consider hepatitis panel.  Chronic systolic CHF/nonischemic cardiomyopathy LVEF 20-25% by last echo in September 2021.  He has implantable AICD.  Clinically does not appear overtly volume overloaded.  Discontinue  IV fluids after tolerating diet well and CBGs in the euglycemic range.  Continue torsemide and Aldactone.  Essential hypertension Soft blood pressures.  He has been previously on Imdur, hydralazine and metolazone but held since September 2021 admission likely related to hypotension.    Hyperlipidemia Continue Crestor.  Paroxysmal atrial fibrillation/AICD. Continue amiodarone and Eliquis.  Remains in sinus rhythm on telemetry.  Hyperkalemia Resolved.  Monitor BMP daily.  Stage III a chronic kidney disease Baseline creatinine not very clear.  His creatinine seems to be widely fluctuating.  He presented with creatinine in the 1.4 range which went up to 1.72 > 1.68?  Related to CT contrast.  Follow BMP in a.m.  Gout Allopurinol held.  No acute flare.  Type II DM with renal complications/hypoglycemia As per spouse's report, he was admitted to the [place] several months ago and was told that he no longer had diabetes.  Having periodic episodes of hypoglycemia, likely related to poor oral intake.  SSI discontinued.  Changed IV fluids to D5 infusion.  Monitor CBGs closely and treat per hypoglycemia protocol.  Anemia of chronic disease:  Stable.  Continue iron and B12 supplementations.  Malnutrition ST evaluated him during recent hospital admission and recommended dysphagia 1 diet.  Will change diet to this.  Open wound to heal WOCN RN consultation..  Continue Prevalon boot.   Body mass index is 27.21 kg/m.  Nutritional Status        DVT prophylaxis: apixaban (ELIQUIS) tablet 2.5 mg Start: 09/26/20 1000     Code Status: Full Code Family Communication: None at bedside Disposition:  Status is: Inpatient  Remains inpatient appropriate because:Inpatient  level of care appropriate due to severity of illness   Dispo: The patient is from: Home              Anticipated d/c is to: Home              Anticipated d/c date is: 2 days              Patient currently is not medically stable to  d/c.        Consultants:   General surgery  Procedures:   None  Antimicrobials:    Anti-infectives (From admission, onward)   None        Subjective:  This morning prior to procedure.  Denied abdominal pain, nausea or vomiting.  Although a poor historian.  As per RN, had multiple BMs 2 days ago and laxatives were held.  Intermittent hypoglycemia.  Objective:   Vitals:   09/28/20 1931 09/29/20 0525 09/29/20 0747 09/29/20 1540  BP: 113/60 (!) 97/52 (!) 106/57 (!) 102/59  Pulse: 89 80 80 80  Resp:  Temp: 98.7 F (37.1 C) 98.3 F (36.8 C) (!) 97.5 F (36.4 C) 98.2 F (36.8 C)  TempSrc: Oral Oral Oral Oral  SpO2: 99% 97% 95% 96%  Weight:        General exam: Pleasant elderly male, moderately built and nourished lying comfortably propped up in bed without distress. Respiratory system: Reduced breath sounds in the bases/some bronchial breath sound just above but otherwise clear to auscultation without wheezing, rhonchi or crackles. Respiratory effort normal. Cardiovascular system: S1 & S2 heard, RRR. No JVD, murmurs, rubs, gallops or clicks. No pedal edema.  Telemetry personally reviewed: Sinus rhythm with BBB morphology. Gastrointestinal system: Abdomen is nondistended, soft and nontender. No organomegaly or masses felt. Normal bowel sounds heard. Central nervous system: Alert and oriented x2. No focal neurological deficits. Extremities: Symmetric 5 x 5 power. Skin: No rashes, lesions or ulcers Psychiatry: Judgement and insight appear normal. Mood & affect appropriate.     Data Reviewed:   I have personally reviewed following labs and imaging studies   CBC: Recent Labs  Lab 09/26/20 0500 09/28/20 1117 09/29/20 0337  WBC 12.2* 10.7* 9.2  NEUTROABS 9.3* 7.7 6.5  HGB 9.1* 7.5* 8.0*  HCT 28.8* 22.9* 23.8*  MCV 99.7 96.2 94.1  PLT 223 235 229    Basic Metabolic Panel: Recent Labs  Lab 09/26/20 0500 09/28/20 1117 09/29/20 0337  NA 143 140 139   K 5.4* 4.8 3.6  CL 115* 106 106  CO2 21* 23 24  GLUCOSE 112* 102* 79  BUN 25* 29* 29*  CREATININE 1.41* 1.72* 1.68*  CALCIUM 8.7* 8.6* 8.6*    Liver Function Tests: Recent Labs  Lab 09/26/20 0500 09/28/20 1117 09/29/20 0337  AST 138* 168* 129*  ALT 31 57* 49*  ALKPHOS 104 151* 134*  BILITOT 2.1* 3.2* 2.9*  PROT 7.0 6.3* 6.5  ALBUMIN 1.9* 1.7* 1.6*    CBG: Recent Labs  Lab 09/29/20 1123 09/29/20 1530 09/29/20 1606  GLUCAP 73 63* 122*    Microbiology Studies:   Recent Results (from the past 240 hour(s))  Respiratory Panel by RT PCR (Flu A&B, Covid) - Nasopharyngeal Swab     Status: None   Collection Time: 09/20/20  8:06 PM   Specimen: Nasopharyngeal Swab  Result Value Ref Range Status   SARS Coronavirus 2 by RT PCR NEGATIVE NEGATIVE Final    Comment: (NOTE) SARS-CoV-2 target nucleic acids are NOT DETECTED.  The SARS-CoV-2 RNA is generally detectable in upper respiratoy specimens during the acute phase of infection. The lowest concentration of SARS-CoV-2 viral copies this assay can detect is 131 copies/mL. A negative result does not preclude SARS-Cov-2 infection and should not be used as the sole basis for treatment or other patient management decisions. A negative result may occur with  improper specimen collection/handling, submission of specimen other than nasopharyngeal swab, presence of viral mutation(s) within the areas targeted by this assay, and inadequate number of viral copies (<131 copies/mL). A negative result must be combined with clinical observations, patient history, and epidemiological information. The expected result is Negative.  Fact Sheet for Patients:  https://www.moore.com/  Fact Sheet for Healthcare Providers:  https://www.young.biz/  This test is no t yet approved or cleared by the [place] FDA and  has been authorized for detection and/or diagnosis of SARS-CoV-2 by FDA under an  Emergency Use Authorization (EUA). This EUA will remain  in effect (meaning this test can be used) for the duration of the COVID-19 declaration under Section 564(b)(1) of the Act, 21 U.S.C. section 360bbb-3(b)(1), unless the authorization is terminated or revoked sooner.     Influenza A by PCR NEGATIVE NEGATIVE Final   Influenza B by PCR NEGATIVE NEGATIVE Final    Comment: (NOTE) The Xpert Xpress SARS-CoV-2/FLU/RSV assay is intended as an aid in  the diagnosis of influenza from Nasopharyngeal swab specimens and  should not be used as a sole basis for treatment. Nasal washings and  aspirates are unacceptable for Xpert Xpress SARS-CoV-2/FLU/RSV  testing.  Fact Sheet for Patients: https://www.moore.com/  Fact Sheet for Healthcare Providers: https://www.young.biz/  This test is not yet approved or cleared by the [place] FDA and  has been authorized for detection and/or diagnosis of SARS-CoV-2 by  FDA under an Emergency Use Authorization (EUA). This EUA will remain  in effect (meaning this test can be used) for the duration of the  Covid-19 declaration under Section 564(b)(1) of the Act, 21  U.S.C. section 360bbb-3(b)(1), unless the authorization is  terminated or revoked. Performed at [facility], [address], [place], [place] [address]   MRSA PCR Screening     Status: None   Collection Time: 09/21/20  3:47 AM   Specimen: Nasal Mucosa; Nasopharyngeal  Result Value Ref Range Status   MRSA by PCR NEGATIVE NEGATIVE Final    Comment:        The GeneXpert MRSA Assay (FDA approved for NASAL specimens only), is one component of a comprehensive MRSA colonization surveillance program. It is not intended to diagnose MRSA infection nor to guide or monitor treatment for MRSA infections. Performed at [facility], [address] [place], [place] [address]   Culture, Urine     Status: None   Collection Time: 09/21/20 10:41  AM   Specimen: Urine, Random  Result Value Ref Range Status   Specimen Description URINE, RANDOM  Final   Special Requests NONE  Final   Culture   Final    NO GROWTH Performed at [facility], [address], [place], [place] [address]    Report Status 09/22/2020 FINAL  Final  SARS Coronavirus 2 by RT PCR (hospital order, performed in [facility] hospital lab) Nasopharyngeal Nasopharyngeal Swab     Status: None   Collection Time: 09/24/20  8:35 PM   Specimen: Nasopharyngeal Swab  Result Value Ref Range Status   SARS Coronavirus 2 NEGATIVE NEGATIVE Final    Comment: (NOTE)  SARS-CoV-2 target nucleic acids are NOT DETECTED.  The SARS-CoV-2 RNA is generally detectable in upper and lower respiratory specimens during the acute phase of infection. The lowest concentration of SARS-CoV-2 viral copies this assay can detect is 250 copies / mL. A negative result does not preclude SARS-CoV-2 infection and should not be used as the sole basis for treatment or other patient management decisions.  A negative result may occur with improper specimen collection / handling, submission of specimen other than nasopharyngeal swab, presence of viral mutation(s) within the areas targeted by this assay, and inadequate number of viral copies (<250 copies / mL). A negative result must be combined with clinical observations, patient history, and epidemiological information.  Fact Sheet for Patients:   BoilerBrush.com.cy  Fact Sheet for Healthcare Providers: https://pope.com/  This test is not yet approved or  cleared by the [place] FDA and has been authorized for detection and/or diagnosis of SARS-CoV-2 by FDA under an Emergency Use Authorization (EUA).  This EUA will remain in effect (meaning this test can be used) for the duration of the COVID-19 declaration under Section 564(b)(1) of the Act, 21 U.S.C. section 360bbb-3(b)(1), unless the  authorization is terminated or revoked sooner.  Performed at [facility], [address], [place], [place] [address]   Respiratory Panel by RT PCR (Flu A&B, Covid) - Nasopharyngeal Swab     Status: None   Collection Time: 09/26/20  6:31 AM   Specimen: Nasopharyngeal Swab  Result Value Ref Range Status   SARS Coronavirus 2 by RT PCR NEGATIVE NEGATIVE Final    Comment: (NOTE) SARS-CoV-2 target nucleic acids are NOT DETECTED.  The SARS-CoV-2 RNA is generally detectable in upper respiratoy specimens during the acute phase of infection. The lowest concentration of SARS-CoV-2 viral copies this assay can detect is 131 copies/mL. A negative result does not preclude SARS-Cov-2 infection and should not be used as the sole basis for treatment or other patient management decisions. A negative result may occur with  improper specimen collection/handling, submission of specimen other than nasopharyngeal swab, presence of viral mutation(s) within the areas targeted by this assay, and inadequate number of viral copies (<131 copies/mL). A negative result must be combined with clinical observations, patient history, and epidemiological information. The expected result is Negative.  Fact Sheet for Patients:  https://www.moore.com/  Fact Sheet for Healthcare Providers:  https://www.young.biz/  This test is no t yet approved or cleared by the [place] FDA and  has been authorized for detection and/or diagnosis of SARS-CoV-2 by FDA under an Emergency Use Authorization (EUA). This EUA will remain  in effect (meaning this test can be used) for the duration of the COVID-19 declaration under Section 564(b)(1) of the Act, 21 U.S.C. section 360bbb-3(b)(1), unless the authorization is terminated or revoked sooner.     Influenza A by PCR NEGATIVE NEGATIVE Final   Influenza B by PCR NEGATIVE NEGATIVE Final    Comment: (NOTE) The Xpert Xpress  SARS-CoV-2/FLU/RSV assay is intended as an aid in  the diagnosis of influenza from Nasopharyngeal swab specimens and  should not be used as a sole basis for treatment. Nasal washings and  aspirates are unacceptable for Xpert Xpress SARS-CoV-2/FLU/RSV  testing.  Fact Sheet for Patients: https://www.moore.com/  Fact Sheet for Healthcare Providers: https://www.young.biz/  This test is not yet approved or cleared by the [place] FDA and  has been authorized for detection and/or diagnosis of SARS-CoV-2 by  FDA under an Emergency Use Authorization (EUA). This EUA will  remain  in effect (meaning this test can be used) for the duration of the  Covid-19 declaration under Section 564(b)(1) of the Act, 21  U.S.C. section 360bbb-3(b)(1), unless the authorization is  terminated or revoked. Performed at [facility], [address], [place], [place] [address]   Culture, Urine     Status: None   Collection Time: 09/26/20 11:26 AM   Specimen: Urine, Random  Result Value Ref Range Status   Specimen Description URINE, RANDOM  Final   Special Requests NONE  Final   Culture   Final    NO GROWTH Performed at [facility], [address], [place], [place] [address]    Report Status 09/27/2020 FINAL  Final     Radiology Studies:  NM Hepatobiliary Liver Func  Result Date: 09/29/2020 CLINICAL DATA:  Chronic upper abdominal pain, abnormal ultrasound with thickened wall question cholecystitis, no fever or leukocytosis EXAM: NUCLEAR MEDICINE HEPATOBILIARY IMAGING TECHNIQUE: Sequential images of the abdomen were obtained out to 60 minutes following intravenous administration of radiopharmaceutical. RADIOPHARMACEUTICALS:  5.3 mCi Tc-42m  Choletec IV COMPARISON:  None FINDINGS: Slightly delayed tracer extraction from bloodstream, with blood pool seen through 20 minutes. Excretion of tracer into biliary tree. Gallbladder visualized at 20 minutes. Small  bowel visualized at 12 minutes. No definite hepatic retention of tracer. Patient received morphine following performance of the initial biliary imaging, precluding assessment of gallbladder motility by fatty meal stimulation. IMPRESSION: Patent biliary tree. No scintigraphic evidence of cholecystitis. Delayed clearance of tracer from bloodstream indicating hepatocellular dysfunction; recommend correlation with LFTs. Electronically Signed   By: Ulyses Southward M.D.   On: 09/29/2020 15:57     Scheduled Meds:   . amiodarone  200 mg Oral Daily  . apixaban  2.5 mg Oral BID  . feeding supplement  237 mL Oral BID BM  . finasteride  5 mg Oral BID  . iron polysaccharides  150 mg Oral Daily  . levothyroxine  125 mcg Oral QAC breakfast  . lidocaine  1 patch Transdermal Q24H  . multivitamin with minerals  1 tablet Oral Daily  . polyethylene glycol  17 g Oral BID  . saccharomyces boulardii  250 mg Oral BID  . senna-docusate  1 tablet Oral BID  . sertraline  25 mg Oral Daily  . spironolactone  12.5 mg Oral QHS  . torsemide  60 mg Oral BID  . cyanocobalamin  1,000 mcg Oral Daily    Continuous Infusions:   . dextrose 50 mL/hr at 09/29/20 1637     LOS: 3 days     Marcellus Scott, MD, [place], [facility]. [facility]    To contact the attending provider between 7A-7P or the covering provider during after hours 7P-7A, please log into the web site [email or web address] and access using universal [place] password for that web site. If you do not have the password, please call the hospital operator.  09/29/2020, 7:13 PM

## 2020-09-30 DIAGNOSIS — R109 Unspecified abdominal pain: Secondary | ICD-10-CM | POA: Diagnosis not present

## 2020-09-30 LAB — CBC WITH DIFFERENTIAL/PLATELET
Abs Immature Granulocytes: 0.04 10*3/uL (ref 0.00–0.07)
Basophils Absolute: 0 10*3/uL (ref 0.0–0.1)
Basophils Relative: 1 %
Eosinophils Absolute: 0.3 10*3/uL (ref 0.0–0.5)
Eosinophils Relative: 4 %
HCT: 24.1 % — ABNORMAL LOW (ref 39.0–52.0)
Hemoglobin: 8 g/dL — ABNORMAL LOW (ref 13.0–17.0)
Immature Granulocytes: 1 %
Lymphocytes Relative: 21 %
Lymphs Abs: 1.7 10*3/uL (ref 0.7–4.0)
MCH: 32.3 pg (ref 26.0–34.0)
MCHC: 33.2 g/dL (ref 30.0–36.0)
MCV: 97.2 fL (ref 80.0–100.0)
Monocytes Absolute: 0.7 10*3/uL (ref 0.1–1.0)
Monocytes Relative: 9 %
Neutro Abs: 5.5 10*3/uL (ref 1.7–7.7)
Neutrophils Relative %: 64 %
Platelets: 235 10*3/uL (ref 150–400)
RBC: 2.48 MIL/uL — ABNORMAL LOW (ref 4.22–5.81)
RDW: 23.8 % — ABNORMAL HIGH (ref 11.5–15.5)
WBC: 8.3 10*3/uL (ref 4.0–10.5)
nRBC: 0 % (ref 0.0–0.2)

## 2020-09-30 LAB — COMPREHENSIVE METABOLIC PANEL
ALT: 42 U/L (ref 0–44)
AST: 112 U/L — ABNORMAL HIGH (ref 15–41)
Albumin: 1.6 g/dL — ABNORMAL LOW (ref 3.5–5.0)
Alkaline Phosphatase: 149 U/L — ABNORMAL HIGH (ref 38–126)
Anion gap: 8 (ref 5–15)
BUN: 30 mg/dL — ABNORMAL HIGH (ref 8–23)
CO2: 28 mmol/L (ref 22–32)
Calcium: 8.6 mg/dL — ABNORMAL LOW (ref 8.9–10.3)
Chloride: 102 mmol/L (ref 98–111)
Creatinine, Ser: 1.81 mg/dL — ABNORMAL HIGH (ref 0.61–1.24)
GFR, Estimated: 36 mL/min — ABNORMAL LOW (ref >60)
Glucose, Bld: 93 mg/dL (ref 70–99)
Potassium: 3.6 mmol/L (ref 3.5–5.1)
Sodium: 138 mmol/L (ref 135–145)
Total Bilirubin: 2.4 mg/dL — ABNORMAL HIGH (ref 0.3–1.2)
Total Protein: 6.5 g/dL (ref 6.5–8.1)

## 2020-09-30 LAB — HEPATITIS PANEL, ACUTE
HCV Ab: NONREACTIVE
Hep A IgM: NONREACTIVE
Hep B C IgM: NONREACTIVE
Hepatitis B Surface Ag: NONREACTIVE

## 2020-09-30 LAB — GLUCOSE, CAPILLARY
Glucose-Capillary: 93 mg/dL (ref 70–99)
Glucose-Capillary: 182 mg/dL — ABNORMAL HIGH (ref 70–99)
Glucose-Capillary: 132 mg/dL — ABNORMAL HIGH (ref 70–99)
Glucose-Capillary: 144 mg/dL — ABNORMAL HIGH (ref 70–99)

## 2020-09-30 MED ORDER — TORSEMIDE 20 MG PO TABS
60.0000 mg | ORAL_TABLET | Freq: Two times a day (BID) | ORAL | Status: DC
  Administered 2020-10-01 (×2): 60 mg via ORAL
  Filled 2020-09-30 (×3): qty 3

## 2020-09-30 MED ORDER — ENSURE ENLIVE PO LIQD
237.0000 mL | Freq: Three times a day (TID) | ORAL | Status: DC
  Administered 2020-10-01 – 2020-10-12 (×21): 237 mL via ORAL

## 2020-09-30 MED ORDER — SPIRONOLACTONE 12.5 MG HALF TABLET
12.5000 mg | ORAL_TABLET | Freq: Every day | ORAL | Status: DC
  Administered 2020-10-01 – 2020-10-11 (×10): 12.5 mg via ORAL
  Filled 2020-09-30 (×12): qty 1

## 2020-09-30 NOTE — Progress Notes (Signed)
PROGRESS NOTE   Ryan Macdonald  VQX:[id number]    DOB: [birth year]    DOA: 09/26/2020  PCP: [facility]   I have briefly reviewed patients previous medical records in [facility].  Chief Complaint  Patient presents with  . Abdominal Pain    Brief Narrative:  86 year old male with PMH of chronic systolic CHF (LVEF 20-25%, AICD in place), paroxysmal A. fib on amiodarone and Eliquis, hypertension, stage IIIb chronic kidney disease, hyperlipidemia, gout, hypothyroidism, recent sepsis related to UTI, discharged home on 10/28 following admission for presumed UTI, presented with abdominal pain.  In the ED, mildly elevated AST/138 and lipase 116, CT abdomen showed right greater than left pleural effusion, periportal edema, renal stones, gastric varices, right hernia and bladder distention.  Admitted for presumed pancreatitis-felt less likely.  Course complicated by AKI.   Assessment & Plan:  Principal Problem:   Pancreatitis Active Problems:   Nonischemic cardiomyopathy (HCC)   Essential hypertension   Hyperlipidemia   PAF (paroxysmal atrial fibrillation) (HCC)   Hyperkalemia   Chronic renal insufficiency, stage 3 (moderate) (HCC)   Gout   Diabetes mellitus type 2 in obese (HCC)   Transaminitis   Anemia of chronic disease   Malnutrition of moderate degree   Abdominal pain   Open wound of left heel   Abdominal pain, unclear etiology. Initial lipase 116, AST 138.  Abdominal CT nonrevealing for cause.  Abdominal ultrasound showed possible thickening of the gallbladder.  General surgery consulted and clinically not cholecystitis.  HIDA scan negative.  General surgery signed off.  Lipase better.  Initiate diet and monitor.  Etiology of his abnormal LFTs is unclear.  LFTs slowly improving.  AST and ALT are slightly down, total bilirubin down from 2.4 from peak of 3.2.  Requested acute hepatitis panel.  Not eating much, encourage oral intake.  Chronic systolic  CHF/nonischemic cardiomyopathy LVEF 20-25% by last echo in September 2021.  He has implantable AICD.  Clinically does not appear overtly volume overloaded.  Held torsemide and Aldactone today due to AKI.  Continue gentle IV fluids with close monitoring due to AKI.  Essential hypertension Soft blood pressures.  He has been previously on Imdur, hydralazine and metolazone but held since September 2021 admission likely related to hypotension.    Hyperlipidemia Continue Crestor.  Paroxysmal atrial fibrillation/AICD. Continue amiodarone and Eliquis.  Remains in sinus rhythm on telemetry.  Hyperkalemia Resolved.  Monitor BMP daily.  Acute kidney injury complicating stage III a chronic kidney disease Baseline creatinine not very clear.  His creatinine seems to be widely fluctuating.  He presented with creatinine in the 1.4 range which went up to 1.72 > 1.68?  Related to CT contrast.  Creatinine has worsened to 1.81.  Held diuretics for today.  Continue gentle IV fluids.  Follow BMP in a.m.  Bladder scan without acute urinary retention.  Gout Allopurinol held.  No acute flare.  Type II DM with renal complications/hypoglycemia As per spouse's report, he was admitted to the [place] several months ago and was told that he no longer had diabetes.  Having periodic episodes of hypoglycemia, likely related to poor oral intake.  SSI discontinued.  Changed IV fluids to D5 infusion.  Monitor CBGs closely and treat per hypoglycemia protocol.  No further hypoglycemic episodes since 11/1 afternoon.  Anemia of chronic disease:  Stable.  Continue iron and B12 supplementations.  Malnutrition See RD input below. ST evaluated him during recent hospital admission and recommended dysphagia 1 diet.  Requested repeat ST evaluation.  Open wound to heal WOCN RN consultation-see detailed note from 09/27/2019.  Continue Prevalon boot.   Body mass index is 27.21 kg/m.  Nutritional Status Nutrition Problem: Severe  Malnutrition Etiology: chronic illness (CHF, recurrent UTI) Signs/Symptoms: severe muscle depletion, moderate fat depletion, energy intake < or equal to 75% for > or equal to 1 month, percent weight loss Percent weight loss: 13 % Interventions: MVI, Ensure Enlive (each supplement provides 350kcal and 20 grams of protein)  DVT prophylaxis: apixaban (ELIQUIS) tablet 2.5 mg Start: 09/26/20 1000     Code Status: Full Code Family Communication: None at bedside.  Unsuccessfully attempted to reach spouse via phone to update care.  Left VM message. Disposition:  Status is: Inpatient  Remains inpatient appropriate because:Inpatient level of care appropriate due to severity of illness   Dispo: The patient is from: Home              Anticipated d/c is to: Home              Anticipated d/c date is: 2 days              Patient currently is not medically stable to d/c.        Consultants:   General surgery  Procedures:   None  Antimicrobials:    Anti-infectives (From admission, onward)   None        Subjective:  Denies complaints.  Get the impression from patient and RN that patient does not eating or drinking much.  No abdominal pain, nausea or vomiting reported.  Objective:   Vitals:   09/30/20 0432 09/30/20 0840 09/30/20 1531 09/30/20 1650  BP: (!) 100/58 (!) 95/54 (!) 98/57 107/63  Pulse: 76 71 76 75  Resp: 18  18 16   Temp: 98.2 F (36.8 C) 98.5 F (36.9 C) 98.5 F (36.9 C) 98.3 F (36.8 C)  TempSrc: Oral Oral Oral Oral  SpO2: 96% 96% 99% 99%  Weight:        General exam: Pleasant elderly male, moderately built and nourished lying comfortably propped up in bed without distress.  No scleral icterus obvious. Respiratory system: Reduced breath sounds in the bases/some bronchial breath sound just above but otherwise clear to auscultation without wheezing, rhonchi or crackles. Respiratory effort normal.  Stable without change. Cardiovascular system: S1 & S2 heard, RRR.  No JVD, murmurs, rubs, gallops or clicks. No pedal edema.  Telemetry personally reviewed: Sinus rhythm with BBB morphology. Gastrointestinal system: Abdomen is nondistended, soft and nontender. No organomegaly or masses felt. Normal bowel sounds heard. Central nervous system: Alert and oriented x2. No focal neurological deficits. Extremities: Symmetric 5 x 5 power.  Left foot and heel floated, dressing clean and dry. Skin: No rashes, lesions or ulcers Psychiatry: Judgement and insight appear normal. Mood & affect appropriate.     Data Reviewed:   I have personally reviewed following labs and imaging studies   CBC: Recent Labs  Lab 09/28/20 1117 09/29/20 0337 09/30/20 0220  WBC 10.7* 9.2 8.3  NEUTROABS 7.7 6.5 5.5  HGB 7.5* 8.0* 8.0*  HCT 22.9* 23.8* 24.1*  MCV 96.2 94.1 97.2  PLT 235 229 235    Basic Metabolic Panel: Recent Labs  Lab 09/28/20 1117 09/29/20 0337 09/30/20 0220  NA 140 139 138  K 4.8 3.6 3.6  CL 106 106 102  CO2 23 24 28   GLUCOSE 102* 79 93  BUN 29* 29* 30*  CREATININE 1.72* 1.68* 1.81*  CALCIUM 8.6*  8.6* 8.6*    Liver Function Tests: Recent Labs  Lab 09/28/20 1117 09/29/20 0337 09/30/20 0220  AST 168* 129* 112*  ALT 57* 49* 42  ALKPHOS 151* 134* 149*  BILITOT 3.2* 2.9* 2.4*  PROT 6.3* 6.5 6.5  ALBUMIN 1.7* 1.6* 1.6*    CBG: Recent Labs  Lab 09/30/20 0709 09/30/20 1123 09/30/20 1742  GLUCAP 93 182* 132*    Microbiology Studies:   Recent Results (from the past 240 hour(s))  Respiratory Panel by RT PCR (Flu A&B, Covid) - Nasopharyngeal Swab     Status: None   Collection Time: 09/20/20  8:06 PM   Specimen: Nasopharyngeal Swab  Result Value Ref Range Status   SARS Coronavirus 2 by RT PCR NEGATIVE NEGATIVE Final    Comment: (NOTE) SARS-CoV-2 target nucleic acids are NOT DETECTED.  The SARS-CoV-2 RNA is generally detectable in upper respiratoy specimens during the acute phase of infection. The lowest concentration of SARS-CoV-2  viral copies this assay can detect is 131 copies/mL. A negative result does not preclude SARS-Cov-2 infection and should not be used as the sole basis for treatment or other patient management decisions. A negative result may occur with  improper specimen collection/handling, submission of specimen other than nasopharyngeal swab, presence of viral mutation(s) within the areas targeted by this assay, and inadequate number of viral copies (<131 copies/mL). A negative result must be combined with clinical observations, patient history, and epidemiological information. The expected result is Negative.  Fact Sheet for Patients:  https://www.moore.com/  Fact Sheet for Healthcare Providers:  https://www.young.biz/  This test is no t yet approved or cleared by the [place] FDA and  has been authorized for detection and/or diagnosis of SARS-CoV-2 by FDA under an Emergency Use Authorization (EUA). This EUA will remain  in effect (meaning this test can be used) for the duration of the COVID-19 declaration under Section 564(b)(1) of the Act, 21 U.S.C. section 360bbb-3(b)(1), unless the authorization is terminated or revoked sooner.     Influenza A by PCR NEGATIVE NEGATIVE Final   Influenza B by PCR NEGATIVE NEGATIVE Final    Comment: (NOTE) The Xpert Xpress SARS-CoV-2/FLU/RSV assay is intended as an aid in  the diagnosis of influenza from Nasopharyngeal swab specimens and  should not be used as a sole basis for treatment. Nasal washings and  aspirates are unacceptable for Xpert Xpress SARS-CoV-2/FLU/RSV  testing.  Fact Sheet for Patients: https://www.moore.com/  Fact Sheet for Healthcare Providers: https://www.young.biz/  This test is not yet approved or cleared by the [place] FDA and  has been authorized for detection and/or diagnosis of SARS-CoV-2 by  FDA under an Emergency Use Authorization (EUA).  This EUA will remain  in effect (meaning this test can be used) for the duration of the  Covid-19 declaration under Section 564(b)(1) of the Act, 21  U.S.C. section 360bbb-3(b)(1), unless the authorization is  terminated or revoked. Performed at [facility], [address] [place], [place] [address]   MRSA PCR Screening     Status: None   Collection Time: 09/21/20  3:47 AM   Specimen: Nasal Mucosa; Nasopharyngeal  Result Value Ref Range Status   MRSA by PCR NEGATIVE NEGATIVE Final    Comment:        The GeneXpert MRSA Assay (FDA approved for NASAL specimens only), is one component of a comprehensive MRSA colonization surveillance program. It is not intended to diagnose MRSA infection nor to guide or monitor treatment for MRSA infections. Performed at [facility], [address] [place], [place] [address]   Culture, Urine     Status: None   Collection Time: 09/21/20 10:41 AM   Specimen: Urine, Random  Result Value Ref Range Status   Specimen Description URINE, RANDOM  Final   Special Requests NONE  Final   Culture   Final    NO GROWTH Performed at [facility], [address], [place], [place] [address]    Report Status 09/22/2020 FINAL  Final  SARS Coronavirus 2 by RT PCR (hospital order, performed in [facility] hospital lab) Nasopharyngeal Nasopharyngeal Swab     Status: None   Collection Time: 09/24/20  8:35 PM   Specimen: Nasopharyngeal Swab  Result Value Ref Range Status   SARS Coronavirus 2 NEGATIVE NEGATIVE Final    Comment: (NOTE) SARS-CoV-2 target nucleic acids are NOT DETECTED.  The SARS-CoV-2 RNA is generally detectable in upper and lower respiratory specimens during the acute phase of infection. The lowest concentration of SARS-CoV-2 viral copies this assay can detect is 250 copies / mL. A negative result does not preclude SARS-CoV-2 infection and should not be used as the sole basis for treatment or other patient management  decisions.  A negative result may occur with improper specimen collection / handling, submission of specimen other than nasopharyngeal swab, presence of viral mutation(s) within the areas targeted by this assay, and inadequate number of viral copies (<250 copies / mL). A negative result must be combined with clinical observations, patient history, and epidemiological information.  Fact Sheet for Patients:   BoilerBrush.com.cy  Fact Sheet for Healthcare Providers: https://pope.com/  This test is not yet approved or  cleared by the [place] FDA and has been authorized for detection and/or diagnosis of SARS-CoV-2 by FDA under an Emergency Use Authorization (EUA).  This EUA will remain in effect (meaning this test can be used) for the duration of the COVID-19 declaration under Section 564(b)(1) of the Act, 21 U.S.C. section 360bbb-3(b)(1), unless the authorization is terminated or revoked sooner.  Performed at [facility], [address], [place], [place] [address]   Respiratory Panel by RT PCR (Flu A&B, Covid) - Nasopharyngeal Swab     Status: None   Collection Time: 09/26/20  6:31 AM   Specimen: Nasopharyngeal Swab  Result Value Ref Range Status   SARS Coronavirus 2 by RT PCR NEGATIVE NEGATIVE Final    Comment: (NOTE) SARS-CoV-2 target nucleic acids are NOT DETECTED.  The SARS-CoV-2 RNA is generally detectable in upper respiratoy specimens during the acute phase of infection. The lowest concentration of SARS-CoV-2 viral copies this assay can detect is 131 copies/mL. A negative result does not preclude SARS-Cov-2 infection and should not be used as the sole basis for treatment or other patient management decisions. A negative result may occur with  improper specimen collection/handling, submission of specimen other than nasopharyngeal swab, presence of viral mutation(s) within the areas targeted by this assay, and  inadequate number of viral copies (<131 copies/mL). A negative result must be combined with clinical observations, patient history, and epidemiological information. The expected result is Negative.  Fact Sheet for Patients:  https://www.moore.com/  Fact Sheet for Healthcare Providers:  https://www.young.biz/  This test is no t yet approved or cleared by the [place] FDA and  has been authorized for detection and/or diagnosis of SARS-CoV-2 by FDA under an Emergency Use Authorization (EUA). This EUA will remain  in effect (meaning this test can be used) for the duration  of the COVID-19 declaration under Section 564(b)(1) of the Act, 21 U.S.C. section 360bbb-3(b)(1), unless the authorization is terminated or revoked sooner.     Influenza A by PCR NEGATIVE NEGATIVE Final   Influenza B by PCR NEGATIVE NEGATIVE Final    Comment: (NOTE) The Xpert Xpress SARS-CoV-2/FLU/RSV assay is intended as an aid in  the diagnosis of influenza from Nasopharyngeal swab specimens and  should not be used as a sole basis for treatment. Nasal washings and  aspirates are unacceptable for Xpert Xpress SARS-CoV-2/FLU/RSV  testing.  Fact Sheet for Patients: https://www.moore.com/  Fact Sheet for Healthcare Providers: https://www.young.biz/  This test is not yet approved or cleared by the [place] FDA and  has been authorized for detection and/or diagnosis of SARS-CoV-2 by  FDA under an Emergency Use Authorization (EUA). This EUA will remain  in effect (meaning this test can be used) for the duration of the  Covid-19 declaration under Section 564(b)(1) of the Act, 21  U.S.C. section 360bbb-3(b)(1), unless the authorization is  terminated or revoked. Performed at [facility], [address], [place], [place] [address]   Culture, Urine     Status: None   Collection Time: 09/26/20 11:26 AM   Specimen: Urine,  Random  Result Value Ref Range Status   Specimen Description URINE, RANDOM  Final   Special Requests NONE  Final   Culture   Final    NO GROWTH Performed at [facility], [address], [place], [place] [address]    Report Status 09/27/2020 FINAL  Final     Radiology Studies:  NM Hepatobiliary Liver Func  Result Date: 09/29/2020 CLINICAL DATA:  Chronic upper abdominal pain, abnormal ultrasound with thickened wall question cholecystitis, no fever or leukocytosis EXAM: NUCLEAR MEDICINE HEPATOBILIARY IMAGING TECHNIQUE: Sequential images of the abdomen were obtained out to 60 minutes following intravenous administration of radiopharmaceutical. RADIOPHARMACEUTICALS:  5.3 mCi Tc-27m  Choletec IV COMPARISON:  None FINDINGS: Slightly delayed tracer extraction from bloodstream, with blood pool seen through 20 minutes. Excretion of tracer into biliary tree. Gallbladder visualized at 20 minutes. Small bowel visualized at 12 minutes. No definite hepatic retention of tracer. Patient received morphine following performance of the initial biliary imaging, precluding assessment of gallbladder motility by fatty meal stimulation. IMPRESSION: Patent biliary tree. No scintigraphic evidence of cholecystitis. Delayed clearance of tracer from bloodstream indicating hepatocellular dysfunction; recommend correlation with LFTs. Electronically Signed   By: Ulyses Southward M.D.   On: 09/29/2020 15:57     Scheduled Meds:   . amiodarone  200 mg Oral Daily  . apixaban  2.5 mg Oral BID  . feeding supplement  237 mL Oral TID BM  . finasteride  5 mg Oral BID  . iron polysaccharides  150 mg Oral Daily  . levothyroxine  125 mcg Oral QAC breakfast  . lidocaine  1 patch Transdermal Q24H  . multivitamin with minerals  1 tablet Oral Daily  . polyethylene glycol  17 g Oral BID  . saccharomyces boulardii  250 mg Oral BID  . senna-docusate  1 tablet Oral BID  . sertraline  25 mg Oral Daily  . [START ON 10/01/2020]  spironolactone  12.5 mg Oral QHS  . [START ON 10/01/2020] torsemide  60 mg Oral BID  . cyanocobalamin  1,000 mcg Oral Daily    Continuous Infusions:   . dextrose 50 mL/hr at 09/30/20 1311     LOS: 4 days     Marcellus Scott, MD, [place], [facility]. [facility]    To contact the attending provider between 7A-7P or the covering provider during after hours 7P-7A, please log into the web site [email or web address] and access using universal [place] password for that web site. If you do not have the password, please call the hospital operator.  09/30/2020, 6:01 PM

## 2020-09-30 NOTE — Progress Notes (Signed)
   09/30/20 0807  Urine Characteristics  Bladder Scan Volume (mL) 498 mL

## 2020-09-30 NOTE — Plan of Care (Signed)

## 2020-09-30 NOTE — Progress Notes (Signed)
Physical Therapy Treatment Patient Details Name: Ryan Macdonald MRN: [id number] DOB: 05-18-1934 Today's Date: 09/30/2020    History of Present Illness Pt is an 86 y/o male admitted secondary to pancreatitis. Pt with recent admissions for UTI. PMH includes HTN, CHF, s/p AICD, a fib, CKD, gout, DM.     PT Comments    Continuing work on functional mobility and activity tolerance;  Session focused on transfers, and Mr. Shein was able to stand from the bed with Mod assist; Multimodal cues to initiate with anterior lean, as he tends to drift into posterior lean; Notable strength for power up, but needing Mod assist for balance; +2 assist for safety with pivot to recliner; Ended session with pt in recliner, chair alarm on, preparing to eat lunch   Follow Up Recommendations  SNF;Supervision/Assistance - 24 hour     Equipment Recommendations  Wheelchair (measurements PT);Wheelchair cushion (measurements PT);Hospital bed;Other (comment) (hoyer lift and hoyer lift pad)    Recommendations for Other Services       Precautions / Restrictions Precautions Precautions: Fall;Other (comment);ICD/Pacemaker Precaution Comments: L heel wound Restrictions Other Position/Activity Restrictions: ? WB restrictions L LE given extent of heel ulcer    Mobility  Bed Mobility Overal bed mobility: Needs Assistance Bed Mobility: Supine to Sit     Supine to sit: Mod assist;HOB elevated     General bed mobility comments: Heavy mod assist and near constant cues to move LEs to clear EOB and elevate trunk to sit; posterior bias in sitting  Transfers Overall transfer level: Needs assistance Equipment used: Rolling walker (2 wheeled) Transfers: Sit to/from [employer] Sit to Stand: From elevated surface;Mod assist Stand pivot transfers: Max assist;+2 physical assistance;+2 safety/equipment       General transfer comment: Mod assist for steadiness, power up, and especially for  anterior weight shift; Mostly adequate strength to power up, but posterior lean leads to need for considerable assist; attempted steps to chair, however difficulty stepping, and had to convert to a pivot transfer  Ambulation/Gait             General Gait Details: Attempted, however significant difficulty with stepping   Stairs             Wheelchair Mobility    Modified Rankin (Stroke Patients Only)       Balance     Sitting balance-Leahy Scale: Poor Sitting balance - Comments: Drifts into posterior lean     Standing balance-Leahy Scale: Poor                              Cognition Arousal/Alertness: Awake/alert Behavior During Therapy: WFL for tasks assessed/performed Overall Cognitive Status: History of cognitive impairments - at baseline Area of Impairment: Orientation;Memory;Following commands;Awareness                       Following Commands: Follows one step commands with increased time Safety/Judgement: Decreased awareness of safety;Decreased awareness of deficits   Problem Solving: Slow processing        Exercises      General Comments        Pertinent Vitals/Pain Pain Assessment: No/denies pain    Home Living                      Prior Function            PT Goals (current goals can now be found in the  care plan section) Acute Rehab PT Goals Patient Stated Goal: Agrees to getting up and OOB PT Goal Formulation: Patient unable to participate in goal setting Time For Goal Achievement: 10/10/20 Potential to Achieve Goals: Fair Progress towards PT goals: Progressing toward goals (Slowly)    Frequency    Min 2X/week      PT Plan Current plan remains appropriate    Co-evaluation              AM-PAC PT "6 Clicks" Mobility   Outcome Measure  Help needed turning from your back to your side while in a flat bed without using bedrails?: A Little Help needed moving from lying on your back to  sitting on the side of a flat bed without using bedrails?: A Lot Help needed moving to and from a bed to a chair (including a wheelchair)?: A Lot Help needed standing up from a chair using your arms (e.g., wheelchair or bedside chair)?: A Lot Help needed to walk in hospital room?: Total Help needed climbing 3-5 steps with a railing? : Total 6 Click Score: 11    End of Session Equipment Utilized During Treatment: Gait belt Activity Tolerance: Patient tolerated treatment well Patient left: in chair;with call bell/phone within reach;with chair alarm set Nurse Communication: Mobility status;Need for lift equipment PT Visit Diagnosis: Muscle weakness (generalized) (M62.81);Other abnormalities of gait and mobility (R26.89);Difficulty in walking, not elsewhere classified (R26.2);Unsteadiness on feet (R26.81)     Time: 8841-6606 PT Time Calculation (min) (ACUTE ONLY): 28 min  Charges:  $Therapeutic Activity: 23-37 mins                     Van Clines, PT  Acute Rehabilitation Services Pager [phone] Office [phone]    Levi Aland 09/30/2020, 5:28 PM

## 2020-09-30 NOTE — Progress Notes (Signed)
Patient  Left heel wound care completed this morning, dressing changed and patient tolerate it well. Pain med given prior, small dried drainage noted on the old dressing , will continue to monitor.

## 2020-09-30 NOTE — Progress Notes (Signed)
Initial Nutrition Assessment  DOCUMENTATION CODES:   Severe malnutrition in context of chronic illness  INTERVENTION:   Ensure Enlive po TID, each supplement provides 350 kcal and 20 grams of protein  MVI with minerals  Staff to encourage PO intake   NUTRITION DIAGNOSIS:   Severe Malnutrition related to chronic illness (CHF, recurrent UTI) as evidenced by severe muscle depletion, moderate fat depletion, energy intake < or equal to 75% for > or equal to 1 month, percent weight loss -13% in 1 month.   GOAL:   Patient will meet greater than or equal to 90% of their needs   MONITOR:   PO intake, Supplement acceptance, Skin  REASON FOR ASSESSMENT:   Malnutrition Screening Tool    ASSESSMENT:   Pt with PMH of CHF with EF 20-25%, AICD in place, Afib, DM, HTN, stage III CKD, HLD who was recently admitted for sepsis secondary to UTI readmitted the next day from home with possible abdominal pain.   Pt d/c'ed to SNF after 08/16/20 admission but d/c'ed home on 10/28 after running out of benefits for skilled care. Wife unable to care for patient at home.   Per surgery no intervention as HIDA negative for acute cholecystitis.   Noted during last admission pt on dysphagia 2 diet and SLP noted pt chewing for 5 minutes per spoonful of eggs and was downgraded to dysphagia 1 diet same day as discharge.   Pt with episodes of hypoglycemia and started on D5 IVF.   Per chart review pt with recurrent admissions for UTI since 01/2020. Per discharge summary in 3/21 pt with dry weight of 199 lb. Per chart review of weight status pt with a weight decrease starting in 07/2020. Pt with 13% weight loss since last admission 07/2020. Per discharge summary in 07/2020 pt was fluid overloaded on exam likely masking weight loss.  Per pt he reports decreased appetite. Previous admissions report poor intake. Lunch at bedside with only bites consumed. Pt with multiple supplements/flavors on bedside table. Per pt he  prefers strawberry ensure to other flavors, discussed with RN. Pt unable to provide history during interview.  Per pt and chart review pt less mobile since 04/2020 due to L heel hyperkeratotic lesion which is now a pressure injury. Suspect some BLE muscle depletion from decreased mobility. Pt in chair during exam and able to raise legs.   Medications reviewed and include: MVI with minerals, miralax, florastor, senokot-s, vitamin B12  IVF: D5 @ 50 ml/hr  Labs reviewed: CBG's: [phone]    NUTRITION - FOCUSED PHYSICAL EXAM:    Most Recent Value  Orbital Region Moderate depletion  Upper Arm Region Moderate depletion  Thoracic and Lumbar Region No depletion  Buccal Region Moderate depletion  Temple Region Severe depletion  Clavicle Bone Region Severe depletion  Clavicle and Acromion Bone Region Severe depletion  Scapular Bone Region Severe depletion  Dorsal Hand Severe depletion  Patellar Region Moderate depletion  Anterior Thigh Region Moderate depletion  Posterior Calf Region Moderate depletion  Edema (RD Assessment) None  Hair Reviewed  Eyes Reviewed  Mouth Reviewed  Skin Reviewed  Nails Reviewed  [pale]       Diet Order:   Diet Order            DIET - DYS 1 Room service appropriate? Yes; Fluid consistency: Thin  Diet effective now                 EDUCATION NEEDS:   Not appropriate for education at this  time  Skin:  Skin Assessment: Skin Integrity Issues: Other: pressure wound L heel  Last BM:  10/31  Height:   Ht Readings from Last 1 Encounters:  09/20/20 5\' 7"  (1.702 m)    Weight:   Wt Readings from Last 1 Encounters:  09/26/20 78.8 kg    Ideal Body Weight:  67.3 kg  BMI:  Body mass index is 27.21 kg/m.  Estimated Nutritional Needs:   Kcal:  2100-2400  Protein:  100-120 grams  Fluid:  2 L/day  09/28/20., RD, LDN, CNSC See AMiON for contact information

## 2020-09-30 NOTE — Discharge Instructions (Signed)

## 2020-10-01 ENCOUNTER — Other Ambulatory Visit: Payer: Self-pay

## 2020-10-01 DIAGNOSIS — K8591 Acute pancreatitis with uninfected necrosis, unspecified: Secondary | ICD-10-CM | POA: Diagnosis not present

## 2020-10-01 LAB — COMPREHENSIVE METABOLIC PANEL
ALT: 40 U/L (ref 0–44)
AST: 98 U/L — ABNORMAL HIGH (ref 15–41)
Albumin: 1.5 g/dL — ABNORMAL LOW (ref 3.5–5.0)
Alkaline Phosphatase: 141 U/L — ABNORMAL HIGH (ref 38–126)
Anion gap: 10 (ref 5–15)
BUN: 32 mg/dL — ABNORMAL HIGH (ref 8–23)
CO2: 25 mmol/L (ref 22–32)
Calcium: 8.3 mg/dL — ABNORMAL LOW (ref 8.9–10.3)
Chloride: 99 mmol/L (ref 98–111)
Creatinine, Ser: 1.76 mg/dL — ABNORMAL HIGH (ref 0.61–1.24)
GFR, Estimated: 38 mL/min — ABNORMAL LOW (ref >60)
Glucose, Bld: 166 mg/dL — ABNORMAL HIGH (ref 70–99)
Potassium: 3.4 mmol/L — ABNORMAL LOW (ref 3.5–5.1)
Sodium: 134 mmol/L — ABNORMAL LOW (ref 135–145)
Total Bilirubin: 1.8 mg/dL — ABNORMAL HIGH (ref 0.3–1.2)
Total Protein: 6.2 g/dL — ABNORMAL LOW (ref 6.5–8.1)

## 2020-10-01 LAB — CBC
HCT: 22.6 % — ABNORMAL LOW (ref 39.0–52.0)
Hemoglobin: 7.6 g/dL — ABNORMAL LOW (ref 13.0–17.0)
MCH: 31.8 pg (ref 26.0–34.0)
MCHC: 33.6 g/dL (ref 30.0–36.0)
MCV: 94.6 fL (ref 80.0–100.0)
Platelets: 224 10*3/uL (ref 150–400)
RBC: 2.39 MIL/uL — ABNORMAL LOW (ref 4.22–5.81)
RDW: 22.8 % — ABNORMAL HIGH (ref 11.5–15.5)
WBC: 7.8 10*3/uL (ref 4.0–10.5)
nRBC: 0 % (ref 0.0–0.2)

## 2020-10-01 LAB — GLUCOSE, CAPILLARY
Glucose-Capillary: 99 mg/dL (ref 70–99)
Glucose-Capillary: 126 mg/dL — ABNORMAL HIGH (ref 70–99)
Glucose-Capillary: 93 mg/dL (ref 70–99)
Glucose-Capillary: 105 mg/dL — ABNORMAL HIGH (ref 70–99)

## 2020-10-01 LAB — HEMOGLOBIN AND HEMATOCRIT, BLOOD
HCT: 23.2 % — ABNORMAL LOW (ref 39.0–52.0)
Hemoglobin: 8 g/dL — ABNORMAL LOW (ref 13.0–17.0)

## 2020-10-01 MED ORDER — POTASSIUM CHLORIDE 20 MEQ PO PACK
40.0000 meq | PACK | Freq: Once | ORAL | Status: AC
  Administered 2020-10-01: 40 meq via ORAL
  Filled 2020-10-01: qty 2

## 2020-10-01 NOTE — Progress Notes (Signed)
Occupational Therapy Treatment Patient Details Name: Ryan Macdonald MRN: [id number] DOB: 1934/02/21 Today's Date: 10/01/2020    History of present illness Pt is an 86 y/o male admitted secondary to pancreatitis. Pt with recent admissions for UTI. PMH includes HTN, CHF, s/p AICD, a fib, CKD, gout, DM.    OT comments  Pt progressing gradually towards OT goals, but continues to require +2 physical assist for bed mobility and simple transfers using RW. Pt with improving cognition, able to follow one step commands but requires increased time and cues to sequence appropriately. Pt overall max A x 2 for bed mobility, Mod A x 2 for stand pivot to chair. Pt initially min guard for balance and able to take a few steps to recliner, but began to fatigue and required Mod A to maintain balance secondary to heavy posterior lean. Pt also noted with bowel incontinence on entry, Total A for cleanup. Pt reports wife unable to assist him at this level at home. Strongly recommend SNF for short term rehab.   Follow Up Recommendations  SNF;Supervision/Assistance - 24 hour    Equipment Recommendations  Wheelchair cushion (measurements OT);Wheelchair (measurements OT);Hospital bed;3 in 1 bedside commode    Recommendations for Other Services      Precautions / Restrictions Precautions Precautions: Fall;Other (comment);ICD/Pacemaker Precaution Comments: L heel wound Restrictions Weight Bearing Restrictions: No Other Position/Activity Restrictions: ? WB restrictions L LE given extent of heel ulcer       Mobility Bed Mobility Overal bed mobility: Needs Assistance Bed Mobility: Supine to Sit;Rolling Rolling: Max assist   Supine to sit: Max assist;+2 for physical assistance;HOB elevated     General bed mobility comments: Max A for rolling side to side for peri care. Good initiation of B LE movement to EOB but required heavy assist for trunk and scooting forward  Transfers Overall transfer level: Needs  assistance Equipment used: Rolling walker (2 wheeled) Transfers: Sit to/from [employer] Sit to Stand: From elevated surface;Max assist;+2 physical assistance Stand pivot transfers: Mod assist;+2 physical assistance;+2 safety/equipment       General transfer comment: Initially Max A x 2 for sit to stand with RW. Able to progress to min guard for standing, MIn A x 2 for initial stand pivot then progressed to Mod A x 2 with heavy posterior lean. Without external support, pt high risk for falling backwards. Assisted slowly into chair. Unsure if sudden progression of LOB due to fatigue    Balance Overall balance assessment: Needs assistance Sitting-balance support: No upper extremity supported;Feet supported Sitting balance-Leahy Scale: Fair Sitting balance - Comments: fair static sitting, able to place B hands in lap but feels more comfortable with B UE support Postural control: Posterior lean;Left lateral lean Standing balance support: Bilateral upper extremity supported;During functional activity Standing balance-Leahy Scale: Poor Standing balance comment: initially min guard in standing progressing to ModA to maintain balance with heavy posterior lean                           ADL either performed or assessed with clinical judgement   ADL Overall ADL's : Needs assistance/impaired                     Lower Body Dressing: Total assistance;Bed level       Toileting- Clothing Manipulation and Hygiene: Total assistance;Bed level Toileting - Clothing Manipulation Details (indicate cue type and reason): Total A for cleanup when noted with bowel incontinence on  entry. Pt reports typically able to feel when he needs to use bathroom       General ADL Comments: Pt limited by decreased strength, balance and cognition     Vision   Vision Assessment?: No apparent visual deficits   Perception     Praxis      Cognition Arousal/Alertness:  Awake/alert Behavior During Therapy: WFL for tasks assessed/performed Overall Cognitive Status: History of cognitive impairments - at baseline Area of Impairment: Orientation;Memory;Following commands;Awareness                 Orientation Level: Time;Situation Current Attention Level: Selective Memory: Decreased short-term memory Following Commands: Follows one step commands with increased time Safety/Judgement: Decreased awareness of safety;Decreased awareness of deficits Awareness: Emergent Problem Solving: Slow processing;Difficulty sequencing;Requires verbal cues;Requires tactile cues General Comments: Pt with improved cognition today, still with memory deficits and decreased awareness of how he will manage at home. Very pleasant and motivated to improve        Exercises     Shoulder Instructions       General Comments      Pertinent Vitals/ Pain       Pain Assessment: No/denies pain  Home Living                                          Prior Functioning/Environment              Frequency  Min 2X/week        Progress Toward Goals  OT Goals(current goals can now be found in the care plan section)  Progress towards OT goals: Progressing toward goals  Acute Rehab OT Goals Patient Stated Goal: motivated to walk more OT Goal Formulation: With patient Time For Goal Achievement: 10/13/20 Potential to Achieve Goals: Fair ADL Goals Pt Will Perform Grooming: with supervision;sitting Pt Will Perform Upper Body Bathing: with modified independence;sitting Pt Will Perform Lower Body Bathing: with min assist;sitting/lateral leans Pt Will Perform Lower Body Dressing: with min assist;sitting/lateral leans Pt Will Transfer to Toilet: with min assist;stand pivot transfer;bedside commode Additional ADL Goal #1: Pt will complete bed mobility with moderate assistance in preparation for ADL.  Plan Discharge plan remains appropriate     Co-evaluation                 AM-PAC OT "6 Clicks" Daily Activity     Outcome Measure   Help from another person eating meals?: A Little Help from another person taking care of personal grooming?: A Little Help from another person toileting, which includes using toliet, bedpan, or urinal?: Total Help from another person bathing (including washing, rinsing, drying)?: Total Help from another person to put on and taking off regular upper body clothing?: A Little Help from another person to put on and taking off regular lower body clothing?: Total 6 Click Score: 12    End of Session Equipment Utilized During Treatment: Gait belt;Rolling walker  OT Visit Diagnosis: Other abnormalities of gait and mobility (R26.89);Unsteadiness on feet (R26.81);Muscle weakness (generalized) (M62.81);Other symptoms and signs involving cognitive function   Activity Tolerance Patient tolerated treatment well;Patient limited by fatigue   Patient Left in chair;with call bell/phone within reach;with chair alarm set   Nurse Communication Mobility status        Time: 6568-1275 OT Time Calculation (min): 33 min  Charges: OT General Charges $OT Visit: 1 Visit OT Treatments $Self  Care/Home Management : 8-22 mins $Therapeutic Activity: 8-22 mins  Lorre Munroe, OTR/L   Lorre Munroe 10/01/2020, 1:23 PM

## 2020-10-01 NOTE — NC FL2 (Signed)
[place] MEDICAID FL2 LEVEL OF CARE SCREENING TOOL     IDENTIFICATION  Patient Name: Ryan Macdonald Birthdate: 12/08/1933 Sex: male Admission Date (Current Location): 09/26/2020  [facility] and [place] Number:  Producer, television/film/video and Address:  [facility] [place], [address], [place], [place] [address]      Provider Number: [id number]  Attending Physician Name and Address:  Cipriano Bunker, MD  Relative Name and Phone Number:       Current Level of Care: Hospital Recommended Level of Care: Skilled Nursing Facility Prior Approval Number:    Date Approved/Denied:   PASRR Number: [id number]  Discharge Plan: SNF    Current Diagnoses: Patient Active Problem List   Diagnosis Date Noted  . Abdominal pain 09/26/2020  . Pancreatitis 09/26/2020  . Open wound of left heel 09/26/2020  . Protein-calorie malnutrition, severe (HCC) 09/23/2020  . Sepsis (HCC) 08/16/2020  . Hematuria 02/15/2020  . Constipation 02/13/2020  . Anemia of chronic disease 02/13/2020  . Secondary hypercoagulable state (HCC) 01/16/2020  . Acute renal failure superimposed on stage 3 chronic kidney disease (HCC) 03/09/2019  . Transaminitis   . Chronic respiratory failure with hypoxia (HCC)   . Morbid obesity (HCC) 09/09/2018  . UTI (urinary tract infection) 08/31/2018  . First degree AV block   . Left knee pain 07/08/2017  . Elevated troponin 04/14/2017  . Lactic acidosis   . Diabetes mellitus type 2 in obese (HCC) 02/03/2017  . Lobar pneumonia (HCC)   . Diabetes mellitus with complication (HCC)   . Depression 05/19/2016  . Gout 05/19/2016  . 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 NSVT (nonsustained ventricular tachycardia) (HCC)   . Chronic renal insufficiency, stage 3 (moderate) (HCC)   . Hyperkalemia 08/21/2015  . Pulmonary embolism (HCC)   . Osteoarthritis of left hip 10/25/2013  . PAF (paroxysmal atrial fibrillation) (HCC) 10/25/2013  .  S/P hip replacement 10/24/2013  . Nonischemic cardiomyopathy (HCC)   . Obesity (BMI 30-39.9)   . Essential hypertension   . Hyperlipidemia     Orientation RESPIRATION BLADDER Height & Weight     Self, Situation, Time, Place  Normal Incontinent Weight: 173 lb 11.6 oz (78.8 kg) Height:     BEHAVIORAL SYMPTOMS/MOOD NEUROLOGICAL BOWEL NUTRITION STATUS        Diet (See discharge summary)  AMBULATORY STATUS COMMUNICATION OF NEEDS Skin   Extensive Assist Verbally Skin abrasions                       Personal Care Assistance Level of Assistance  Bathing, Feeding, Dressing, Total care Bathing Assistance: Maximum assistance   Dressing Assistance: Maximum assistance Total Care Assistance: Maximum assistance   Functional Limitations Info  Sight, Hearing, Speech Sight Info: Adequate Hearing Info: Adequate Speech Info: Adequate    SPECIAL CARE FACTORS FREQUENCY  PT (By licensed PT), OT (By licensed OT)     PT Frequency: 5x a week OT Frequency: 5x a week            Contractures Contractures Info: Not present    Additional Factors Info  Code Status, Psychotropic, Allergies Code Status Info: Full Allergies Info: lisinopril, xarelto Psychotropic Info: zoloft         Current Medications (10/01/2020):  This is the current hospital active medication list Current Facility-Administered Medications  Medication Dose Route Frequency Provider Last Rate Last Admin  . amiodarone (PACERONE) tablet 200 mg  200 mg Oral Daily Reva Bores, MD  200 mg at 10/01/20 0838  . apixaban (ELIQUIS) tablet 2.5 mg  2.5 mg Oral BID Reva Bores, MD   2.5 mg at 10/01/20 9390  . benzonatate (TESSALON) capsule 100 mg  100 mg Oral TID PRN Reva Bores, MD   100 mg at 10/01/20 0018  . dextrose 5 % solution   Intravenous Continuous Elease Etienne, MD 50 mL/hr at 09/30/20 1311 New Bag at 09/30/20 1311  . diclofenac Sodium (VOLTAREN) 1 % topical gel 4 g  4 g Topical BID PRN Reva Bores, MD       . feeding supplement (ENSURE ENLIVE / ENSURE PLUS) liquid 237 mL  237 mL Oral TID BM Hongalgi, Anand D, MD      . fentaNYL (SUBLIMAZE) injection 12.5-50 mcg  12.5-50 mcg Intravenous Q2H PRN Reva Bores, MD   12.5 mcg at 09/29/20 1542  . finasteride (PROSCAR) tablet 5 mg  5 mg Oral BID Reva Bores, MD   5 mg at 10/01/20 3009  . HYDROcodone-acetaminophen (NORCO/VICODIN) 5-325 MG per tablet 1 tablet  1 tablet Oral TID PRN Reva Bores, MD   1 tablet at 10/01/20 0603  . ipratropium-albuterol (DUONEB) 0.5-2.5 (3) MG/3ML nebulizer solution 3 mL  3 mL Nebulization Q4H PRN Reva Bores, MD      . iron polysaccharides (NIFEREX) capsule 150 mg  150 mg Oral Daily Reva Bores, MD   150 mg at 10/01/20 0839  . levothyroxine (SYNTHROID) tablet 125 mcg  125 mcg Oral QAC breakfast Reva Bores, MD   125 mcg at 10/01/20 0603  . lidocaine (LIDODERM) 5 % 1 patch  1 patch Transdermal Q24H Reva Bores, MD   1 patch at 10/01/20 [phone]  . multivitamin with minerals tablet 1 tablet  1 tablet Oral Daily Reva Bores, MD   1 tablet at 10/01/20 0837  . ondansetron (ZOFRAN) tablet 4 mg  4 mg Oral Q6H PRN Reva Bores, MD       Or  . ondansetron [facility]) injection 4 mg  4 mg Intravenous Q6H PRN Reva Bores, MD      . polyethylene glycol (MIRALAX / GLYCOLAX) packet 17 g  17 g Oral BID Reva Bores, MD   17 g at 10/01/20 0839  . saccharomyces boulardii (FLORASTOR) capsule 250 mg  250 mg Oral BID Reva Bores, MD   250 mg at 10/01/20 0762  . senna-docusate (Senokot-S) tablet 1 tablet  1 tablet Oral BID Reva Bores, MD   1 tablet at 10/01/20 [phone]  . sertraline (ZOLOFT) tablet 25 mg  25 mg Oral Daily Reva Bores, MD   25 mg at 10/01/20 0839  . spironolactone (ALDACTONE) tablet 12.5 mg  12.5 mg Oral QHS Hongalgi, Anand D, MD      . torsemide (DEMADEX) tablet 60 mg  60 mg Oral BID Elease Etienne, MD   60 mg at 10/01/20 0839  . traZODone (DESYREL) tablet 25 mg  25 mg Oral QHS PRN Reva Bores, MD    25 mg at 09/29/20 2130  . vitamin B-12 (CYANOCOBALAMIN) tablet 1,000 mcg  1,000 mcg Oral Daily Reva Bores, MD   1,000 mcg at 10/01/20 3545     Discharge Medications: Please see discharge summary for a list of discharge medications.  Relevant Imaging Results:  Relevant Lab Results:   Additional Information SS# [id number], [address]

## 2020-10-01 NOTE — Progress Notes (Signed)
PROGRESS NOTE    Ryan Macdonald  KMQ:[id number] DOB: 03-18-1934 DOA: 09/26/2020 PCP: [facility]    Brief Narrative: This 86 year old male with PMH of chronic systolic CHF (LVEF 20-25%, AICD in place), paroxysmal A. fib on amiodarone and Eliquis, hypertension, stage IIIb chronic kidney disease, hyperlipidemia, gout, hypothyroidism, recent sepsis related to UTI, discharged home on 10/28 following admission for presumed UTI, presented with abdominal pain.  In the ED, mildly elevated AST/138 and lipase 116, CT abdomen showed right greater than left pleural effusion, periportal edema, renal stones, gastric varices, right hernia and bladder distention.  Admitted for presumed pancreatitis-felt less likely.  Course complicated by AKI.   Assessment & Plan:   Principal Problem:   Pancreatitis Active Problems:   Nonischemic cardiomyopathy (HCC)   Essential hypertension   Hyperlipidemia   PAF (paroxysmal atrial fibrillation) (HCC)   Hyperkalemia   Chronic renal insufficiency, stage 3 (moderate) (HCC)   Gout   Diabetes mellitus type 2 in obese (HCC)   Transaminitis   Anemia of chronic disease   Protein-calorie malnutrition, severe (HCC)   Abdominal pain   Open wound of left heel   Abdominal pain, unclear etiology: He presented with abdominal pain, initial lipase 116, AST 138.  Abdominal CT nonrevealing for cause.  Abdominal ultrasound showed possible thickening of the gallbladder.  General surgery consulted and clinically not cholecystitis.  HIDA scan negative. General surgery signed off.  Lipase getting better.  Initiated diet,  Etiology of his abnormal LFTs is unclear.  LFTs slowly improving.  AST and ALT are slightly down, total bilirubin down to 1.8 from peak of 3.2.  Hepatitis profile negative. Not eating much, encourage oral intake.  Chronic systolic CHF/nonischemic cardiomyopathy LVEF 20-25% by last echo in September 2021.  He has implantable AICD.  Clinically does  not appear overtly volume overloaded.  Held torsemide and Aldactone today due to AKI.  Continue gentle IV fluids with close monitoring due to AKI. Renal functions improving. Will resume torsemide tomorrow.  Essential hypertension Soft blood pressures.  He has been previously on Imdur, hydralazine and metolazone but held since September 2021 admission likely related to hypotension.    Hyperlipidemia Continue Crestor.  Paroxysmal atrial fibrillation/AICD. Continue amiodarone and Eliquis.  Remains in sinus rhythm on telemetry.  Hyperkalemia Resolved.  Monitor BMP daily.  Acute kidney injury complicating stage III a chronic kidney disease Baseline creatinine not very clear.  His creatinine seems to be widely fluctuating.  He presented with creatinine in the 1.4 range which went up to 1.72 > 1.68?  Related to CT contrast.  Creatinine has worsened to 1.81.  Held diuretics 11/1.  Continue gentle IV fluids.   Bladder scan without acute urinary retention.  Gout Allopurinol held.  No acute flare.  Type II DM with renal complications/hypoglycemia Spouse reports he was admitted to the [place] several months ago and was told that he no longer had diabetes.  He was having periodic episodes of hypoglycemia, likely related to poor oral intake.  SSI discontinued.  Changed IV fluids to D5 infusion.  Monitor CBGs closely and treat per hypoglycemia protocol.  No further hypoglycemic episodes since 11/1 afternoon.  Anemia of chronic disease:  Stable.  Continue iron and B12 supplementations. Hb remains at 8.0  Malnutrition ST evaluated him during recent hospital admission and recommended dysphagia 1 diet.  Requested repeat ST evaluation.  Open wound to heal WOCN RN consultation-see detailed note from 09/27/2019.  Continue Prevalon boot.   Body mass index is 27.21  kg/m.  Nutritional Status Nutrition Problem: Severe Malnutrition Etiology: chronic illness (CHF, recurrent UTI) Signs/Symptoms:  severe muscle depletion, moderate fat depletion, energy intake < or equal to 75% for > or equal to 1 month, percent weight loss Percent weight loss: 13 % Interventions: MVI, Ensure Enlive (each supplement provides 350kcal and 20 grams of protein)   DVT prophylaxis: Eliquis Code Status: Full code. Family Communication:  No one at bed side. Disposition Plan:   Status is: Inpatient  Remains inpatient appropriate because:Inpatient level of care appropriate due to severity of illness   Dispo: The patient is from: Home              Anticipated d/c is to: Home              Anticipated d/c date is: 2 days              Patient currently is not medically stable to d/c.   Consultants:   General surgery  Procedures: Antimicrobials: Anti-infectives (From admission, onward)   None     Subjective: Patient was seen and examined at bedside.  Overnight events noted.  Patient reports feeling better.  Denies any nausea, vomiting and diarrhea. She states abdominal pain has improved.  Patient has AICD on the left chest  Objective: Vitals:   09/30/20 1650 09/30/20 2041 10/01/20 0256 10/01/20 1300  BP: 107/63 103/61 (!) 102/56 105/63  Pulse: 75 73 72 70  Resp: 16  17   Temp: 98.3 F (36.8 C) 98.7 F (37.1 C) 98.5 F (36.9 C) 98.3 F (36.8 C)  TempSrc: Oral Oral Oral Oral  SpO2: 99% 99% 98% 99%  Weight:        Intake/Output Summary (Last 24 hours) at 10/01/2020 1546 Last data filed at 10/01/2020 0900 Gross per 24 hour  Intake 720 ml  Output --  Net 720 ml   Filed Weights   09/26/20 1241  Weight: 78.8 kg    Examination:  General exam: Appears calm and comfortable. Respiratory system: Clear to auscultation. Respiratory effort normal. Reduced breath sounds at bases. Chest : AICD on left chest. Cardiovascular system: S1 & S2 heard, RRR. No JVD, murmurs, rubs, gallops or clicks. No pedal edema. Gastrointestinal system: Abdomen is nondistended, soft and nontender. No organomegaly  or masses felt. Normal bowel sounds heard. Central nervous system: Alert and oriented. No focal neurological deficits. Extremities: No edema, No cyanosis, no clubbing. Skin: No rashes, lesions or ulcers Psychiatry: Judgement and insight appear normal. Mood & affect appropriate.     Data Reviewed: I have personally reviewed following labs and imaging studies  CBC: Recent Labs  Lab 09/26/20 0500 09/26/20 0500 09/28/20 1117 09/29/20 0337 09/30/20 0220 10/01/20 0143 10/01/20 1103  WBC 12.2*  --  10.7* 9.2 8.3 7.8  --   NEUTROABS 9.3*  --  7.7 6.5 5.5  --   --   HGB 9.1*   < > 7.5* 8.0* 8.0* 7.6* 8.0*  HCT 28.8*   < > 22.9* 23.8* 24.1* 22.6* 23.2*  MCV 99.7  --  96.2 94.1 97.2 94.6  --   PLT 223  --  235 229 235 224  --    < > = values in this interval not displayed.   Basic Metabolic Panel: Recent Labs  Lab 09/26/20 0500 09/28/20 1117 09/29/20 0337 09/30/20 0220 10/01/20 0143  NA 143 140 139 138 134*  K 5.4* 4.8 3.6 3.6 3.4*  CL 115* 106 106 102 99  CO2 21* 23 24 28  25  GLUCOSE 112* 102* 79 93 166*  BUN 25* 29* 29* 30* 32*  CREATININE 1.41* 1.72* 1.68* 1.81* 1.76*  CALCIUM 8.7* 8.6* 8.6* 8.6* 8.3*   GFR: Estimated Creatinine Clearance: 29.2 mL/min (A) (by C-G formula based on SCr of 1.76 mg/dL (H)). Liver Function Tests: Recent Labs  Lab 09/26/20 0500 09/28/20 1117 09/29/20 0337 09/30/20 0220 10/01/20 0143  AST 138* 168* 129* 112* 98*  ALT 31 57* 49* 42 40  ALKPHOS 104 151* 134* 149* 141*  BILITOT 2.1* 3.2* 2.9* 2.4* 1.8*  PROT 7.0 6.3* 6.5 6.5 6.2*  ALBUMIN 1.9* 1.7* 1.6* 1.6* 1.5*   Recent Labs  Lab 09/26/20 0500 09/28/20 1117 09/29/20 0337  LIPASE 116* 122* 92*   No results for input(s): AMMONIA in the last 168 hours. Coagulation Profile: No results for input(s): INR, PROTIME in the last 168 hours. Cardiac Enzymes: No results for input(s): CKTOTAL, CKMB, CKMBINDEX, TROPONINI in the last 168 hours. BNP (last 3 results) No results for input(s):  PROBNP in the last 8760 hours. HbA1C: No results for input(s): HGBA1C in the last 72 hours. CBG: Recent Labs  Lab 09/30/20 1123 09/30/20 1742 09/30/20 2017 10/01/20 0706 10/01/20 1144  GLUCAP 182* 132* 144* 99 126*   Lipid Profile: No results for input(s): CHOL, HDL, LDLCALC, TRIG, CHOLHDL, LDLDIRECT in the last 72 hours. Thyroid Function Tests: No results for input(s): TSH, T4TOTAL, FREET4, T3FREE, THYROIDAB in the last 72 hours. Anemia Panel: No results for input(s): VITAMINB12, FOLATE, FERRITIN, TIBC, IRON, RETICCTPCT in the last 72 hours. Sepsis Labs: No results for input(s): PROCALCITON, LATICACIDVEN in the last 168 hours.  Recent Results (from the past 240 hour(s))  SARS Coronavirus 2 by RT PCR (hospital order, performed in [facility] hospital lab) Nasopharyngeal Nasopharyngeal Swab     Status: None   Collection Time: 09/24/20  8:35 PM   Specimen: Nasopharyngeal Swab  Result Value Ref Range Status   SARS Coronavirus 2 NEGATIVE NEGATIVE Final    Comment: (NOTE) SARS-CoV-2 target nucleic acids are NOT DETECTED.  The SARS-CoV-2 RNA is generally detectable in upper and lower respiratory specimens during the acute phase of infection. The lowest concentration of SARS-CoV-2 viral copies this assay can detect is 250 copies / mL. A negative result does not preclude SARS-CoV-2 infection and should not be used as the sole basis for treatment or other patient management decisions.  A negative result may occur with improper specimen collection / handling, submission of specimen other than nasopharyngeal swab, presence of viral mutation(s) within the areas targeted by this assay, and inadequate number of viral copies (<250 copies / mL). A negative result must be combined with clinical observations, patient history, and epidemiological information.  Fact Sheet for Patients:   BoilerBrush.com.cy  Fact Sheet for Healthcare  Providers: https://pope.com/  This test is not yet approved or  cleared by the [place] FDA and has been authorized for detection and/or diagnosis of SARS-CoV-2 by FDA under an Emergency Use Authorization (EUA).  This EUA will remain in effect (meaning this test can be used) for the duration of the COVID-19 declaration under Section 564(b)(1) of the Act, 21 U.S.C. section 360bbb-3(b)(1), unless the authorization is terminated or revoked sooner.  Performed at [facility], [address] [place], [place] [address]   Respiratory Panel by RT PCR (Flu A&B, Covid) - Nasopharyngeal Swab     Status: None   Collection Time: 09/26/20  6:31 AM   Specimen: Nasopharyngeal Swab  Result Value Ref Range  Status   SARS Coronavirus 2 by RT PCR NEGATIVE NEGATIVE Final    Comment: (NOTE) SARS-CoV-2 target nucleic acids are NOT DETECTED.  The SARS-CoV-2 RNA is generally detectable in upper respiratoy specimens during the acute phase of infection. The lowest concentration of SARS-CoV-2 viral copies this assay can detect is 131 copies/mL. A negative result does not preclude SARS-Cov-2 infection and should not be used as the sole basis for treatment or other patient management decisions. A negative result may occur with  improper specimen collection/handling, submission of specimen other than nasopharyngeal swab, presence of viral mutation(s) within the areas targeted by this assay, and inadequate number of viral copies (<131 copies/mL). A negative result must be combined with clinical observations, patient history, and epidemiological information. The expected result is Negative.  Fact Sheet for Patients:  https://www.moore.com/  Fact Sheet for Healthcare Providers:  https://www.young.biz/  This test is no t yet approved or cleared by the [place] FDA and  has been authorized for detection and/or diagnosis of SARS-CoV-2  by FDA under an Emergency Use Authorization (EUA). This EUA will remain  in effect (meaning this test can be used) for the duration of the COVID-19 declaration under Section 564(b)(1) of the Act, 21 U.S.C. section 360bbb-3(b)(1), unless the authorization is terminated or revoked sooner.     Influenza A by PCR NEGATIVE NEGATIVE Final   Influenza B by PCR NEGATIVE NEGATIVE Final    Comment: (NOTE) The Xpert Xpress SARS-CoV-2/FLU/RSV assay is intended as an aid in  the diagnosis of influenza from Nasopharyngeal swab specimens and  should not be used as a sole basis for treatment. Nasal washings and  aspirates are unacceptable for Xpert Xpress SARS-CoV-2/FLU/RSV  testing.  Fact Sheet for Patients: https://www.moore.com/  Fact Sheet for Healthcare Providers: https://www.young.biz/  This test is not yet approved or cleared by the [place] FDA and  has been authorized for detection and/or diagnosis of SARS-CoV-2 by  FDA under an Emergency Use Authorization (EUA). This EUA will remain  in effect (meaning this test can be used) for the duration of the  Covid-19 declaration under Section 564(b)(1) of the Act, 21  U.S.C. section 360bbb-3(b)(1), unless the authorization is  terminated or revoked. Performed at [facility], [address] [place], [place] [address]   Culture, Urine     Status: None   Collection Time: 09/26/20 11:26 AM   Specimen: Urine, Random  Result Value Ref Range Status   Specimen Description URINE, RANDOM  Final   Special Requests NONE  Final   Culture   Final    NO GROWTH Performed at [facility], [address] [place], [place] [address]    Report Status 09/27/2020 FINAL  Final     Radiology Studies: No results found.   Scheduled Meds: . amiodarone  200 mg Oral Daily  . apixaban  2.5 mg Oral BID  . feeding supplement  237 mL Oral TID BM  . finasteride  5 mg Oral BID  . iron polysaccharides  150  mg Oral Daily  . levothyroxine  125 mcg Oral QAC breakfast  . lidocaine  1 patch Transdermal Q24H  . multivitamin with minerals  1 tablet Oral Daily  . polyethylene glycol  17 g Oral BID  . saccharomyces boulardii  250 mg Oral BID  . senna-docusate  1 tablet Oral BID  . sertraline  25 mg Oral Daily  . spironolactone  12.5 mg Oral QHS  . torsemide  60 mg Oral  BID  . cyanocobalamin  1,000 mcg Oral Daily   Continuous Infusions: . dextrose 50 mL/hr at 09/30/20 1311     LOS: 5 days    Time spent:  25 mins.    Cipriano Bunker, MD [facility]   If 7PM-7AM, please contact night-coverage

## 2020-10-01 NOTE — TOC Initial Note (Signed)
Transition of Care [facility]) - Initial/Assessment Note    Patient Details  Name: Ryan Macdonald MRN: [id number] Date of Birth: 1934/07/15  Transition of Care [facility]) CM/SW Contact:    Emeterio Reeve, [place] Phone Number: 10/01/2020, 3:22 PM  Clinical Narrative:                  CSW met with pt at bedside. CSW introduced self and explained her role at the hospital.  PTA pt reports he was recently at [facility], discharged on 10/23 and went home for a short period of time. Pt reports he lives at home with his wife. Pt reports his wife and daughter help him as much as they can. Pt reports he has had the covid vaccines.  CSW reviewed pt reccs of snf. Pt reports he is ok with SNF but does not want to return to [place]. Pt gave CSW permission to fax out to other facilities in the area.   TOC will continue to follow.   Expected Discharge Plan: Skilled Nursing Facility Barriers to Discharge: Insurance Authorization, Continued Medical Work up   Patient Goals and CMS Choice Patient states their goals for this hospitalization and ongoing recovery are:: to get better CMS Medicare.gov Compare Post Acute Care list provided to:: Patient Choice offered to / list presented to : Patient  Expected Discharge Plan and Services Expected Discharge Plan: [place]       Living arrangements for the past 2 months: Single Family Home                                      Prior Living Arrangements/Services Living arrangements for the past 2 months: Single Family Home Lives with:: Spouse Patient language and need for interpreter reviewed:: Yes        Need for Family Participation in Patient Care: Yes (Comment) Care giver support system in place?: Yes (comment)   Criminal Activity/Legal Involvement Pertinent to Current Situation/Hospitalization: No - Comment as needed  Activities of Daily Living   ADL Screening (condition at time of admission) Patient's cognitive ability  adequate to safely complete daily activities?: No Is the patient deaf or have difficulty hearing?: No Does the patient have difficulty seeing, even when wearing glasses/contacts?: No Does the patient have difficulty concentrating, remembering, or making decisions?: Yes Patient able to express need for assistance with ADLs?: Yes Does the patient have difficulty dressing or bathing?: Yes Independently performs ADLs?: No Communication: Independent Dressing (OT): Needs assistance Is this a change from baseline?: Pre-admission baseline Grooming: Needs assistance Is this a change from baseline?: Pre-admission baseline Does the patient have difficulty walking or climbing stairs?: Yes Weakness of Legs: Both Weakness of Arms/Hands: Both  Permission Sought/Granted   Permission granted to share information with : Yes, Verbal Permission Granted     Permission granted to share info w AGENCY: SNF        Emotional Assessment Appearance:: Appears stated age Attitude/Demeanor/Rapport: Engaged Affect (typically observed): Appropriate Orientation: : Oriented to Self, Oriented to Place, Oriented to  Time, Oriented to Situation Alcohol / Substance Use: Not Applicable Psych Involvement: No (comment)  Admission diagnosis:  Pancreatitis [K85.90] Epigastric pain [R10.13] Elevated lipase [R74.8] Abdominal pain [R10.9] Patient Active Problem List   Diagnosis Date Noted  . Abdominal pain 09/26/2020  . Pancreatitis 09/26/2020  . Open wound of left heel 09/26/2020  . Protein-calorie malnutrition, severe ([place]) 09/23/2020  .  Sepsis ([place]) 08/16/2020  . Hematuria 02/15/2020  . Constipation 02/13/2020  . Anemia of chronic disease 02/13/2020  . Secondary hypercoagulable state ([place]) 01/16/2020  . Acute renal failure superimposed on stage 3 chronic kidney disease ([place]) 03/09/2019  . Transaminitis   . Chronic respiratory failure with hypoxia ([place])   . Morbid obesity ([place]) 09/09/2018  . UTI (urinary tract  infection) 08/31/2018  . First degree AV block   . Left knee pain 07/08/2017  . Elevated troponin 04/14/2017  . Lactic acidosis   . Diabetes mellitus type 2 in obese ([place]) 02/03/2017  . Lobar pneumonia ([place])   . Diabetes mellitus with complication ([place])   . Depression 05/19/2016  . Gout 05/19/2016  . 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 NSVT (nonsustained ventricular tachycardia) ([place])   . Chronic renal insufficiency, stage 3 (moderate) (HCC)   . Hyperkalemia 08/21/2015  . Pulmonary embolism ([place])   . Osteoarthritis of left hip 10/25/2013  . PAF (paroxysmal atrial fibrillation) ([place]) 10/25/2013  . S/P hip replacement 10/24/2013  . Nonischemic cardiomyopathy ([place])   . Obesity (BMI 30-39.9)   . Essential hypertension   . Hyperlipidemia    PCP:  [facility] [place]:   [place], [place] [address] Phone: [phone] Fax: [place], [place] [address] Phone: [phone] Fax: [phone]  [id number] [place] [address] [id number]: [phone]: [phone]    Social Determinants of Health (SDOH) Interventions    Readmission Risk Interventions Readmission Risk Prevention Plan 09/25/2020  Transportation Screening Complete  [place] or [place]  Social Work Consult for RMaloPlanning/Counseling Complete  Palliative Care Screening Not Applicable  Medication Review (Press photographer Complete  Some recent data might be hidden   MEmeterio Reeve LLatanya Presser LSmith CornerSocial Worker [phone]

## 2020-10-01 NOTE — Progress Notes (Signed)
Patient  Left heel wound care completed this morning, dressing changed and patient tolerate it well. Pain medicine given prior, small dried drainage noted on the old dressing , will continue to monitor.

## 2020-10-01 NOTE — Evaluation (Signed)
Clinical/Bedside Swallow Evaluation Patient Details  Name: Ryan Macdonald MRN: [id number] Date of Birth: [birth year]  Today's Date: 10/01/2020 Time: SLP Start Time (ACUTE ONLY): 0911 SLP Stop Time (ACUTE ONLY): 0927 SLP Time Calculation (min) (ACUTE ONLY): 16 min  Past Medical History:  Past Medical History:  Diagnosis Date  . Acute bronchitis 05/10/2017  . Acute respiratory failure (HCC)   . Acute respiratory failure with hypoxia (HCC) 09/27/2017  . AKI (acute kidney injury) (HCC) 02/2019  . AKI (acute kidney injury) (HCC) 03/14/2019  . Atrial fibrillation (HCC)   . CAP (community acquired pneumonia) 02/03/2017  . Cardiomyopathy (HCC)   . Cat scratch fever    removed mass on right side neck  . Cataracts, bilateral   . CHF (congestive heart failure) (HCC)   . CHF exacerbation (HCC) 09/27/2017  . Chronic kidney disease   . Corns and callosities   . Diabetes mellitus without complication (HCC)    TYPE 2  . Dyspnea   . Erythema intertrigo   . Flat foot   . Gout   . High cholesterol   . Hx of urethral stricture   . Hypertension   . Kidney stones   . Morbid obesity (HCC)   . Nonischemic cardiomyopathy (HCC)    a. ? 2009 Cath in [place] - nl cors per pt;  b. 09/2013 Echo: EF 25-30%, sev diff HK.  . Obesity   . Osteoarthritis   . PAF (paroxysmal atrial fibrillation) (HCC)    a. post-op hip in 2014 - prev on xarelto.  . Renal insufficiency    Past Surgical History:  Past Surgical History:  Procedure Laterality Date  . CARDIAC CATHETERIZATION  1980's  . CARDIOVERSION N/A 01/15/2020   Procedure: CARDIOVERSION;  Surgeon: Laurey Morale, MD;  Location: [facility] ENDOSCOPY;  Service: Cardiovascular;  Laterality: N/A;  . CIRCUMCISION  1940's  . CORONARY STENT INTERVENTION N/A 08/18/2018   Procedure: CORONARY STENT INTERVENTION;  Surgeon: Corky Crafts, MD;  Location: [facility] INVASIVE CV LAB;  Service: Cardiovascular;  Laterality: N/A;  . CYSTOSCOPY WITH URETHRAL DILATATION  10/23/2013   . CYSTOSCOPY WITH URETHRAL DILATATION N/A 10/23/2013   Procedure: CYSTOSCOPY WITH URETHRAL DILATATION;  Surgeon: Kathi Ludwig, MD;  Location: [facility] OR;  Service: Urology;  Laterality: N/A;  . INCISION AND DRAINAGE OF WOUND Right 1980's   "cat scratch" (10/23/2013)  . INGUINAL HERNIA REPAIR Right 1950's  . INTRAVASCULAR ULTRASOUND/IVUS N/A 08/18/2018   Procedure: Intravascular Ultrasound/IVUS;  Surgeon: Corky Crafts, MD;  Location: [facility] INVASIVE CV LAB;  Service: Cardiovascular;  Laterality: N/A;  . KIDNEY STONE SURGERY Right 1970's; 1983   "twice"  . LEFT HEART CATH AND CORONARY ANGIOGRAPHY N/A 08/18/2018   Procedure: LEFT HEART CATH AND CORONARY ANGIOGRAPHY;  Surgeon: Corky Crafts, MD;  Location: [facility] INVASIVE CV LAB;  Service: Cardiovascular;  Laterality: N/A;  . RIGHT HEART CATH N/A 02/19/2020   Procedure: RIGHT HEART CATH;  Surgeon: Laurey Morale, MD;  Location: [facility] INVASIVE CV LAB;  Service: Cardiovascular;  Laterality: N/A;  . TEE WITHOUT CARDIOVERSION N/A 01/15/2020   Procedure: TRANSESOPHAGEAL ECHOCARDIOGRAM (TEE);  Surgeon: Laurey Morale, MD;  Location: [facility] ENDOSCOPY;  Service: Cardiovascular;  Laterality: N/A;  . TONSILLECTOMY AND ADENOIDECTOMY  1940's  . TOTAL HIP ARTHROPLASTY Left 10/23/2013   Procedure: TOTAL HIP ARTHROPLASTY;  Surgeon: Valeria Batman, MD;  Location: [facility] OR;  Service: Orthopedics;  Laterality: Left;   HPI:  Pt is an 86 y.o. male with medical history significant of HFrEF (EF  20 to 25%, AICD in place), paroxysmal A. fib on amiodarone and Eliquis, hypertension, chronic kidney disease 3B, hyperlipidemia on a statin, gout, hypothyroidism, and recent sepsis related to a UTI.  He was discharged on 10/28 to home following admission for presumed UTI and readmitted on 10/29 secondary abdominal pain and nausea. CT adbdomen: Large right and small left pleural effusion with multi segment atelectasis. Short gastric varices. Pt swas seen during last admission  on 10/27 and 10/28 by SLP. A dyspahgia 2 diet was initially recommneded but his diet was subsequently downgraded to dyspahgia 1 due to prolonged mastication.    Assessment / Plan / Recommendation Clinical Impression  Pt was seen for bedside swallow evaluation. He was less confused than when he was last seen by this SLP on 10/28 for treatment. He reported that everyone continues to speak with him about eating, but that he only wants to eat what he wants. Oral mechanism exam was [facility] and he presented with full dentures. No s/sx of aspiration were noted with solids or liquids. Mastication was prolonged with regular textures but [facility] for other consistencies and no significant oral residue was noted. His swallow function appears improved compared to prior admission and a dysphagia 3 diet with thin liquids is recommended at this time. SLP will follow to assess diet tolerance.  SLP Visit Diagnosis: Dysphagia, unspecified (R13.10)    Aspiration Risk  Mild aspiration risk    Diet Recommendation Thin liquid;Dysphagia 3 (Mech soft)   Liquid Administration via: Cup;Straw Medication Administration: Whole meds with puree Supervision: Staff to assist with self feeding Compensations: Slow rate;Small sips/bites;Follow solids with liquid    Other  Recommendations Oral Care Recommendations: Oral care BID   Follow up Recommendations  (TBD)      Frequency and Duration min 2x/week  2 weeks       Prognosis Prognosis for Safe Diet Advancement: Good Barriers to Reach Goals: Cognitive deficits      Swallow Study   General Date of Onset: 09/23/20 HPI: Pt is an 86 y.o. male with medical history significant of HFrEF (EF 20 to 25%, AICD in place), paroxysmal A. fib on amiodarone and Eliquis, hypertension, chronic kidney disease 3B, hyperlipidemia on a statin, gout, hypothyroidism, and recent sepsis related to a UTI.  He was discharged on 10/28 to home following admission for presumed UTI and readmitted on 10/29  secondary abdominal pain and nausea. CT adbdomen: Large right and small left pleural effusion with multi segment atelectasis. Short gastric varices. Pt swas seen during last admission on 10/27 and 10/28 by SLP. A dyspahgia 2 diet was initially recommneded but his diet was subsequently downgraded to dyspahgia 1 due to prolonged mastication.  Type of Study: Bedside Swallow Evaluation Previous Swallow Assessment: None Diet Prior to this Study: Dysphagia 1 (puree);Thin liquids Temperature Spikes Noted: No Respiratory Status: Room air History of Recent Intubation: No Behavior/Cognition: Alert;Cooperative;Pleasant mood;Confused Oral Cavity Assessment: Within Functional Limits Oral Care Completed by SLP: No Oral Cavity - Dentition: Adequate natural dentition Vision: Functional for self-feeding Self-Feeding Abilities: Needs assist Baseline Vocal Quality: Normal Volitional Cough: Strong Volitional Swallow: Able to elicit    Oral/Motor/Sensory Function Overall Oral Motor/Sensory Function: Within functional limits   Ice Chips Ice chips: Within functional limits Presentation: Spoon   Thin Liquid Thin Liquid: Within functional limits Presentation: Straw    Nectar Thick Nectar Thick Liquid: Not tested   Honey Thick Honey Thick Liquid: Not tested   Puree Puree: Within functional limits Presentation: Spoon   Solid  Solid: Impaired Oral Phase Impairments: Impaired mastication     Mylin Gignac I. Vear Clock, MS, CCC-SLP Acute Rehabilitation Services Office number [phone] Pager [phone]  [name] 10/01/2020,9:39 AM

## 2020-10-01 NOTE — Plan of Care (Signed)
  Problem: Health Behavior/Discharge Planning: Goal: Ability to manage health-related needs will improve Outcome: Progressing   Problem: Clinical Measurements: Goal: Ability to maintain clinical measurements within normal limits will improve Outcome: Progressing Goal: Will remain free from infection Outcome: Progressing   

## 2020-10-02 ENCOUNTER — Encounter (HOSPITAL_COMMUNITY): Payer: Self-pay | Admitting: Family Medicine

## 2020-10-02 DIAGNOSIS — K8591 Acute pancreatitis with uninfected necrosis, unspecified: Secondary | ICD-10-CM | POA: Diagnosis not present

## 2020-10-02 LAB — HEMOGLOBIN AND HEMATOCRIT, BLOOD
HCT: 23.2 % — ABNORMAL LOW (ref 39.0–52.0)
HCT: 23.8 % — ABNORMAL LOW (ref 39.0–52.0)
Hemoglobin: 7.7 g/dL — ABNORMAL LOW (ref 13.0–17.0)
Hemoglobin: 8 g/dL — ABNORMAL LOW (ref 13.0–17.0)

## 2020-10-02 LAB — COMPREHENSIVE METABOLIC PANEL
ALT: 38 U/L (ref 0–44)
AST: 89 U/L — ABNORMAL HIGH (ref 15–41)
Albumin: 1.6 g/dL — ABNORMAL LOW (ref 3.5–5.0)
Alkaline Phosphatase: 134 U/L — ABNORMAL HIGH (ref 38–126)
Anion gap: 8 (ref 5–15)
BUN: 27 mg/dL — ABNORMAL HIGH (ref 8–23)
CO2: 28 mmol/L (ref 22–32)
Calcium: 8.4 mg/dL — ABNORMAL LOW (ref 8.9–10.3)
Chloride: 100 mmol/L (ref 98–111)
Creatinine, Ser: 1.69 mg/dL — ABNORMAL HIGH (ref 0.61–1.24)
GFR, Estimated: 40 mL/min — ABNORMAL LOW (ref >60)
Glucose, Bld: 108 mg/dL — ABNORMAL HIGH (ref 70–99)
Potassium: 3.7 mmol/L (ref 3.5–5.1)
Sodium: 136 mmol/L (ref 135–145)
Total Bilirubin: 1.6 mg/dL — ABNORMAL HIGH (ref 0.3–1.2)
Total Protein: 6.7 g/dL (ref 6.5–8.1)

## 2020-10-02 LAB — GLUCOSE, CAPILLARY
Glucose-Capillary: 72 mg/dL (ref 70–99)
Glucose-Capillary: 111 mg/dL — ABNORMAL HIGH (ref 70–99)

## 2020-10-02 LAB — PHOSPHORUS: Phosphorus: 2.8 mg/dL (ref 2.5–4.6)

## 2020-10-02 LAB — MAGNESIUM: Magnesium: 1.6 mg/dL — ABNORMAL LOW (ref 1.7–2.4)

## 2020-10-02 MED ORDER — MAGNESIUM SULFATE 2 GM/50ML IV SOLN
2.0000 g | Freq: Once | INTRAVENOUS | Status: AC
  Administered 2020-10-02: 2 g via INTRAVENOUS
  Filled 2020-10-02: qty 50

## 2020-10-02 MED ORDER — TORSEMIDE 20 MG PO TABS
60.0000 mg | ORAL_TABLET | Freq: Every day | ORAL | Status: DC
  Administered 2020-10-02 – 2020-10-05 (×4): 60 mg via ORAL
  Filled 2020-10-02 (×5): qty 3

## 2020-10-02 NOTE — Progress Notes (Signed)
PROGRESS NOTE    Ryan Macdonald  ZES:[id number] DOB: Apr 14, 1934 DOA: 09/26/2020 PCP: [facility]    Brief Narrative: This 86 year old male with PMH of chronic systolic CHF (LVEF 20-25%, AICD in place), paroxysmal A. fib on amiodarone and Eliquis, hypertension, stage IIIb chronic kidney disease, hyperlipidemia, gout, hypothyroidism, recent sepsis related to UTI, discharged home on 10/28 following admission for presumed UTI, presented with abdominal pain.  In the ED, mildly elevated AST/138 and lipase 116, CT abdomen showed right greater than left pleural effusion, periportal edema, renal stones, gastric varices, right hernia and bladder distention.  Admitted for presumed pancreatitis-felt less likely.  Course complicated by AKI.   Assessment & Plan:   Principal Problem:   Pancreatitis Active Problems:   Nonischemic cardiomyopathy (HCC)   Essential hypertension   Hyperlipidemia   PAF (paroxysmal atrial fibrillation) (HCC)   Hyperkalemia   Chronic renal insufficiency, stage 3 (moderate) (HCC)   Gout   Diabetes mellitus type 2 in obese (HCC)   Transaminitis   Anemia of chronic disease   Protein-calorie malnutrition, severe (HCC)   Abdominal pain   Open wound of left heel   Abdominal pain, unclear etiology: He presented with abdominal pain, initial lipase 116, AST 138.  Abdominal CT nonrevealing for cause.  Abdominal ultrasound showed possible thickening of the gallbladder.  General surgery consulted and clinically not cholecystitis.  HIDA scan negative. General surgery signed off.  Lipase getting better.  Initiated diet,  Etiology of his abnormal LFTs is unclear.  LFTs slowly improving.  AST and ALT are slightly down, total bilirubin down to 1.6 from peak of 3.2.  Hepatitis profile negative. Not eating much, encouraged oral intake.  Chronic systolic CHF/nonischemic cardiomyopathy LVEF 20-25% by last echo in September 2021.  He has implantable AICD.  Clinically  does not appear overtly volume overloaded.  Held torsemide and Aldactone today due to AKI.  Continue gentle IV fluids with close monitoring due to AKI. Renal functions improving. Will resume torsemide tomorrow.  Essential hypertension Soft blood pressures.  He has been previously on Imdur, hydralazine and metolazone but held since September 2021 admission likely related to hypotension.    Hyperlipidemia Continue Crestor.  Paroxysmal atrial fibrillation/AICD. Continue amiodarone and Eliquis.  Remains in sinus rhythm on telemetry.  Hyperkalemia Resolved.  Monitor BMP daily.  Acute kidney injury complicating stage III a chronic kidney disease Baseline creatinine not very clear.  His creatinine seems to be widely fluctuating.  He presented with creatinine in the 1.4 range which went up to 1.72 > 1.68?  Related to CT contrast.  Creatinine has worsened to 1.81.  Held diuretics 11/1.  Continue gentle IV fluids.  Bladder scan without acute urinary retention.  Gout Allopurinol held.  No acute flare.  Type II DM with renal complications/hypoglycemia Spouse reports he was admitted to the [place] several months ago and was told that he no longer had diabetes.  He was having periodic episodes of hypoglycemia, likely related to poor oral intake.  SSI discontinued.  Changed IV fluids to D5 infusion.  Monitor CBGs closely and treat per hypoglycemia protocol.  No further hypoglycemic episodes since 11/1 afternoon.  Anemia of chronic disease:  Stable.  Continue iron and B12 supplementations. Hb remains at 8.0  Malnutrition ST evaluated him during recent hospital admission and recommended dysphagia 1 diet.  Requested repeat ST evaluation.  Open wound to heal WOCN RN consultation-see detailed note from 09/27/2019.  Continue Prevalon boot.   Body mass index is 27.21 kg/m.  Nutritional Status Nutrition Problem: Severe Malnutrition Etiology: chronic illness (CHF, recurrent  UTI) Signs/Symptoms: severe muscle depletion, moderate fat depletion, energy intake < or equal to 75% for > or equal to 1 month, percent weight loss Percent weight loss: 13 % Interventions: MVI, Ensure Enlive (each supplement provides 350kcal and 20 grams of protein)   DVT prophylaxis: Eliquis Code Status: Full code. Family Communication:  No one at bed side. Disposition Plan:   Status is: Inpatient  Remains inpatient appropriate because:Inpatient level of care appropriate due to severity of illness   Dispo: The patient is from: Home              Anticipated d/c is to: Home              Anticipated d/c date is: 2 days              Patient currently is not medically stable to d/c.   Consultants:   General surgery  Procedures: Antimicrobials: Anti-infectives (From admission, onward)   None     Subjective: Patient was seen and examined at bedside.  Overnight events noted.  Patient reports feeling better.  He states abdominal pain has improved.  Patient has AICD on the left chest,  Left foot in immobilizer   Objective: Vitals:   10/01/20 1300 10/01/20 1913 10/02/20 0300 10/02/20 0850  BP: 105/63 (!) 102/57 (!) 109/55 (!) 110/52  Pulse: 70 70 66 74  Resp:  17  18  Temp: 98.3 F (36.8 C) 98.3 F (36.8 C) 97.9 F (36.6 C)   TempSrc: Oral Oral Axillary   SpO2: 99% 99% 97% 98%  Weight:   79.8 kg   Height:   5\' 7"  (1.702 m)     Intake/Output Summary (Last 24 hours) at 10/02/2020 1516 Last data filed at 10/02/2020 1240 Gross per 24 hour  Intake 120 ml  Output 2350 ml  Net -2230 ml   Filed Weights   09/26/20 1241 10/02/20 0300  Weight: 78.8 kg 79.8 kg    Examination:  General exam: Appears calm and comfortable. Respiratory system: Clear to auscultation. Respiratory effort normal. Reduced breath sounds at bases. Chest : AICD on left chest. Cardiovascular system: S1 & S2 heard, RRR. No JVD, murmurs, rubs, gallops or clicks. No pedal edema. Gastrointestinal system:  Abdomen is nondistended, soft and nontender. No organomegaly or masses felt. Normal bowel sounds heard. Central nervous system: Alert and oriented. No focal neurological deficits. Extremities: No edema, No cyanosis, no clubbing. Left foot in immobilizer. Skin: No rashes, lesions or ulcers Psychiatry: Judgement and insight appear normal. Mood & affect appropriate.     Data Reviewed: I have personally reviewed following labs and imaging studies  CBC: Recent Labs  Lab 09/26/20 0500 09/26/20 0500 09/28/20 1117 09/28/20 1117 09/29/20 0337 09/29/20 0337 09/30/20 0220 10/01/20 0143 10/01/20 1103 10/02/20 0128 10/02/20 0838  WBC 12.2*  --  10.7*  --  9.2  --  8.3 7.8  --   --   --   NEUTROABS 9.3*  --  7.7  --  6.5  --  5.5  --   --   --   --   HGB 9.1*   < > 7.5*   < > 8.0*   < > 8.0* 7.6* 8.0* 7.7* 8.0*  HCT 28.8*   < > 22.9*   < > 23.8*   < > 24.1* 22.6* 23.2* 23.2* 23.8*  MCV 99.7  --  96.2  --  94.1  --  97.2  94.6  --   --   --   PLT 223  --  235  --  229  --  235 224  --   --   --    < > = values in this interval not displayed.   Basic Metabolic Panel: Recent Labs  Lab 09/28/20 1117 09/29/20 0337 09/30/20 0220 10/01/20 0143 10/02/20 0128  NA 140 139 138 134* 136  K 4.8 3.6 3.6 3.4* 3.7  CL 106 106 102 99 100  CO2 23 24 28 25 28   GLUCOSE 102* 79 93 166* 108*  BUN 29* 29* 30* 32* 27*  CREATININE 1.72* 1.68* 1.81* 1.76* 1.69*  CALCIUM 8.6* 8.6* 8.6* 8.3* 8.4*  MG  --   --   --   --  1.6*  PHOS  --   --   --   --  2.8   GFR: Estimated Creatinine Clearance: 33 mL/min (A) (by C-G formula based on SCr of 1.69 mg/dL (H)). Liver Function Tests: Recent Labs  Lab 09/28/20 1117 09/29/20 0337 09/30/20 0220 10/01/20 0143 10/02/20 0128  AST 168* 129* 112* 98* 89*  ALT 57* 49* 42 40 38  ALKPHOS 151* 134* 149* 141* 134*  BILITOT 3.2* 2.9* 2.4* 1.8* 1.6*  PROT 6.3* 6.5 6.5 6.2* 6.7  ALBUMIN 1.7* 1.6* 1.6* 1.5* 1.6*   Recent Labs  Lab 09/26/20 0500 09/28/20 1117  09/29/20 0337  LIPASE 116* 122* 92*   No results for input(s): AMMONIA in the last 168 hours. Coagulation Profile: No results for input(s): INR, PROTIME in the last 168 hours. Cardiac Enzymes: No results for input(s): CKTOTAL, CKMB, CKMBINDEX, TROPONINI in the last 168 hours. BNP (last 3 results) No results for input(s): PROBNP in the last 8760 hours. HbA1C: No results for input(s): HGBA1C in the last 72 hours. CBG: Recent Labs  Lab 10/01/20 0706 10/01/20 1144 10/01/20 1628 10/01/20 2014 10/02/20 0632  GLUCAP 99 126* 93 105* 72   Lipid Profile: No results for input(s): CHOL, HDL, LDLCALC, TRIG, CHOLHDL, LDLDIRECT in the last 72 hours. Thyroid Function Tests: No results for input(s): TSH, T4TOTAL, FREET4, T3FREE, THYROIDAB in the last 72 hours. Anemia Panel: No results for input(s): VITAMINB12, FOLATE, FERRITIN, TIBC, IRON, RETICCTPCT in the last 72 hours. Sepsis Labs: No results for input(s): PROCALCITON, LATICACIDVEN in the last 168 hours.  Recent Results (from the past 240 hour(s))  SARS Coronavirus 2 by RT PCR (hospital order, performed in [facility] hospital lab) Nasopharyngeal Nasopharyngeal Swab     Status: None   Collection Time: 09/24/20  8:35 PM   Specimen: Nasopharyngeal Swab  Result Value Ref Range Status   SARS Coronavirus 2 NEGATIVE NEGATIVE Final    Comment: (NOTE) SARS-CoV-2 target nucleic acids are NOT DETECTED.  The SARS-CoV-2 RNA is generally detectable in upper and lower respiratory specimens during the acute phase of infection. The lowest concentration of SARS-CoV-2 viral copies this assay can detect is 250 copies / mL. A negative result does not preclude SARS-CoV-2 infection and should not be used as the sole basis for treatment or other patient management decisions.  A negative result may occur with improper specimen collection / handling, submission of specimen other than nasopharyngeal swab, presence of viral mutation(s) within the areas  targeted by this assay, and inadequate number of viral copies (<250 copies / mL). A negative result must be combined with clinical observations, patient history, and epidemiological information.  Fact Sheet for Patients:   09/26/20  Fact Sheet for Healthcare Providers: BoilerBrush.com.cy  This test is not yet approved or  cleared by the [place] and has been authorized for detection and/or diagnosis of SARS-CoV-2 by FDA under an Emergency Use Authorization (EUA).  This EUA will remain in effect (meaning this test can be used) for the duration of the COVID-19 declaration under Section 564(b)(1) of the Act, 21 U.S.C. section 360bbb-3(b)(1), unless the authorization is terminated or revoked sooner.  Performed at [facility], [address] [place], [place] [address]   Respiratory Panel by RT PCR (Flu A&B, Covid) - Nasopharyngeal Swab     Status: None   Collection Time: 09/26/20  6:31 AM   Specimen: Nasopharyngeal Swab  Result Value Ref Range Status   SARS Coronavirus 2 by RT PCR NEGATIVE NEGATIVE Final    Comment: (NOTE) SARS-CoV-2 target nucleic acids are NOT DETECTED.  The SARS-CoV-2 RNA is generally detectable in upper respiratoy specimens during the acute phase of infection. The lowest concentration of SARS-CoV-2 viral copies this assay can detect is 131 copies/mL. A negative result does not preclude SARS-Cov-2 infection and should not be used as the sole basis for treatment or other patient management decisions. A negative result may occur with  improper specimen collection/handling, submission of specimen other than nasopharyngeal swab, presence of viral mutation(s) within the areas targeted by this assay, and inadequate number of viral copies (<131 copies/mL). A negative result must be combined with clinical observations, patient history, and epidemiological information. The expected result is  Negative.  Fact Sheet for Patients:  https://www.moore.com/  Fact Sheet for Healthcare Providers:  https://www.young.biz/  This test is no t yet approved or cleared by the [place] FDA and  has been authorized for detection and/or diagnosis of SARS-CoV-2 by FDA under an Emergency Use Authorization (EUA). This EUA will remain  in effect (meaning this test can be used) for the duration of the COVID-19 declaration under Section 564(b)(1) of the Act, 21 U.S.C. section 360bbb-3(b)(1), unless the authorization is terminated or revoked sooner.     Influenza A by PCR NEGATIVE NEGATIVE Final   Influenza B by PCR NEGATIVE NEGATIVE Final    Comment: (NOTE) The Xpert Xpress SARS-CoV-2/FLU/RSV assay is intended as an aid in  the diagnosis of influenza from Nasopharyngeal swab specimens and  should not be used as a sole basis for treatment. Nasal washings and  aspirates are unacceptable for Xpert Xpress SARS-CoV-2/FLU/RSV  testing.  Fact Sheet for Patients: https://www.moore.com/  Fact Sheet for Healthcare Providers: https://www.young.biz/  This test is not yet approved or cleared by the [place] FDA and  has been authorized for detection and/or diagnosis of SARS-CoV-2 by  FDA under an Emergency Use Authorization (EUA). This EUA will remain  in effect (meaning this test can be used) for the duration of the  Covid-19 declaration under Section 564(b)(1) of the Act, 21  U.S.C. section 360bbb-3(b)(1), unless the authorization is  terminated or revoked. Performed at [facility], [address] [place], [place] [address]   Culture, Urine     Status: None   Collection Time: 09/26/20 11:26 AM   Specimen: Urine, Random  Result Value Ref Range Status   Specimen Description URINE, RANDOM  Final   Special Requests NONE  Final   Culture   Final    NO GROWTH Performed at [facility], [address], [place], [place] [address]    Report Status 09/27/2020 FINAL  Final     Radiology Studies: No results found.  Scheduled Meds: . amiodarone  200 mg Oral Daily  . apixaban  2.5 mg Oral BID  . feeding supplement  237 mL Oral TID BM  . finasteride  5 mg Oral BID  . iron polysaccharides  150 mg Oral Daily  . levothyroxine  125 mcg Oral QAC breakfast  . lidocaine  1 patch Transdermal Q24H  . multivitamin with minerals  1 tablet Oral Daily  . polyethylene glycol  17 g Oral BID  . saccharomyces boulardii  250 mg Oral BID  . senna-docusate  1 tablet Oral BID  . sertraline  25 mg Oral Daily  . spironolactone  12.5 mg Oral QHS  . torsemide  60 mg Oral Daily  . cyanocobalamin  1,000 mcg Oral Daily   Continuous Infusions: . dextrose 50 mL/hr at 09/30/20 1311     LOS: 6 days    Time spent:  25 mins.    Cipriano Bunker, MD [facility]   If 7PM-7AM, please contact night-coverage

## 2020-10-02 NOTE — TOC Progression Note (Signed)
Transition of Care [facility]) - Progression Note    Patient Details  Name: Ryan Macdonald MRN: [id number] Date of Birth: 1934-06-26  [facility] CM/SW Contact  Jimmy Picket, [place] Phone Number: 10/02/2020, 1:36 PM  Clinical Narrative:     Pt currently has no bed offers at this time. Insurance may be a barrier since pt is not service connected as indicated in past notes. Pt is not able to return to [facility] until balance is paid.  CSW faxed further out.   Expected Discharge Plan: Skilled Nursing Facility Barriers to Discharge: English as a second language teacher, Continued Medical Work up  Expected Discharge Plan and Services Expected Discharge Plan: Skilled Nursing Facility       Living arrangements for the past 2 months: Single Family Home                                       Social Determinants of Health (SDOH) Interventions    Readmission Risk Interventions Readmission Risk Prevention Plan 09/25/2020  Transportation Screening Complete  HRI or Home Care Consult Complete  Social Work Consult for Recovery Care Planning/Counseling Complete  Palliative Care Screening Not Applicable  Medication Review Ocean[employer]) Complete  Some recent data might be hidden   Jimmy Picket, Theresia Majors, [facility] Clinical Social Worker [phone]

## 2020-10-02 NOTE — Progress Notes (Signed)
  Speech Language Pathology Treatment: Dysphagia  Patient Details Name: Ryan Macdonald MRN: [id number] DOB: 10-12-1934 Today's Date: 10/02/2020 Time: 1219-1227 SLP Time Calculation (min) (ACUTE ONLY): 8 min  Assessment / Plan / Recommendation Clinical Impression  Pt was seen for dysphagia treatment and was cooperative throughout the session. Pt and nursing reported that the pt has been tolerating the current diet without overt s/sx of aspiration. Pt's lunch was present and only a few pieces of meatloaf were missing; however, pt was resistant to eating more and stated that he was full. With encouragement, pt consumed and tolerated dysphagia 3 solids and thin liquids via straw using consecutive swallows without symptoms of oropharyngeal dysphagia. It is recommended that the current diet be continued. Further skilled SLP services are not clinically indicated at this time.    HPI HPI: Pt is an 86 y.o. male with medical history significant of HFrEF (EF 20 to 25%, AICD in place), paroxysmal A. fib on amiodarone and Eliquis, hypertension, chronic kidney disease 3B, hyperlipidemia on a statin, gout, hypothyroidism, and recent sepsis related to a UTI.  He was discharged on 10/28 to home following admission for presumed UTI and readmitted on 10/29 secondary abdominal pain and nausea. CT adbdomen: Large right and small left pleural effusion with multi segment atelectasis. Short gastric varices. Pt swas seen during last admission on 10/27 and 10/28 by SLP. A dyspahgia 2 diet was initially recommneded but his diet was subsequently downgraded to dyspahgia 1 due to prolonged mastication.       SLP Plan  All goals met;Discharge SLP treatment due to (comment)       Recommendations  Diet recommendations: Dysphagia 3 (mechanical soft);Thin liquid Liquids provided via: Cup;Straw Medication Administration: Whole meds with puree Supervision: Staff to assist with self feeding Compensations: Slow rate;Small  sips/bites Postural Changes and/or Swallow Maneuvers: Seated upright 90 degrees;Upright 30-60 min after meal                Oral Care Recommendations: Oral care BID Follow up Recommendations:  (TBD) SLP Visit Diagnosis: Dysphagia, unspecified (R13.10) Plan: All goals met;Discharge SLP treatment due to (comment)       Esme Freund I. Hardin Negus, [place], [place] Office number [phone] Pager [place] 10/02/2020, 1:23 PM

## 2020-10-02 NOTE — Progress Notes (Signed)
Occupational Therapy Treatment Patient Details Name: Badr Piedra MRN: [id number] DOB: 07/21/1934 Today's Date: 10/02/2020    History of present illness Pt is an 86 y/o male admitted secondary to pancreatitis. Pt with recent admissions for UTI. PMH includes HTN, CHF, s/p AICD, a fib, CKD, gout, DM.    OT comments  Pt progressing well towards OT goals. Pt able to demonstrate short mobility in room using RW with Min A x 2. Pt with decreased posterior lean today while turning with RW, but continues to require +2 Max A for sit to stand transitions and bed mobility. Plan to address standing balance during LB ADLs during next session. Continue to recommend SNF for short term rehab. Pt motivated to continue improving independence.   Follow Up Recommendations  SNF;Supervision/Assistance - 24 hour    Equipment Recommendations  Wheelchair cushion (measurements OT);Wheelchair (measurements OT);Hospital bed;3 in 1 bedside commode    Recommendations for Other Services      Precautions / Restrictions Precautions Precautions: Fall;Other (comment);ICD/Pacemaker Precaution Comments: L heel wound Restrictions Weight Bearing Restrictions: No Other Position/Activity Restrictions: ? WB restrictions L LE given extent of heel ulcer       Mobility Bed Mobility Overal bed mobility: Needs Assistance Bed Mobility: Supine to Sit     Supine to sit: Mod assist;+2 for physical assistance;HOB elevated;+2 for safety/equipment     General bed mobility comments: Improved ability to move B LE off of bed, heavy assist for trunk  Transfers Overall transfer level: Needs assistance Equipment used: Rolling walker (2 wheeled) Transfers: Sit to/from [employer] Sit to Stand: From elevated surface;Max assist;+2 physical assistance Stand pivot transfers: Min assist;+2 physical assistance;+2 safety/equipment       General transfer comment: Pt varied from max A x 2 to Mod A x 2 for sit to  stand transfers, cues for hand placement and to lean forward to gain balance due to tendency for posterior lean. Pt able to demo short mobility in room with RW at Min A x 2 and Min A 2 for turning and pivot to chair. Decreased posterior lean compared to previous session    Balance Overall balance assessment: Needs assistance Sitting-balance support: No upper extremity supported;Feet supported Sitting balance-Leahy Scale: Fair Sitting balance - Comments: fair static sitting, difficulty removing R UE from bedside today Postural control: Posterior lean Standing balance support: Bilateral upper extremity supported;During functional activity Standing balance-Leahy Scale: Poor Standing balance comment: reliant on B UE support, decreased external support needed today                           ADL either performed or assessed with clinical judgement   ADL Overall ADL's : Needs assistance/impaired                     Lower Body Dressing: Maximal assistance;Bed level Lower Body Dressing Details (indicate cue type and reason): Max A to don socks bed level             Functional mobility during ADLs: Minimal assistance;+2 for physical assistance;+2 for safety/equipment;Rolling walker;Cueing for sequencing General ADL Comments: Limited by decreased endurance and balance but improving since yesterday's session     Vision   Vision Assessment?: No apparent visual deficits   Perception     Praxis      Cognition Arousal/Alertness: Awake/alert Behavior During Therapy: WFL for tasks assessed/performed Overall Cognitive Status: History of cognitive impairments - at baseline Area of Impairment:  Orientation;Memory;Following commands;Awareness                 Orientation Level: Time;Situation Current Attention Level: Selective Memory: Decreased short-term memory Following Commands: Follows one step commands with increased time Safety/Judgement: Decreased awareness of  safety;Decreased awareness of deficits Awareness: Emergent Problem Solving: Slow processing;Difficulty sequencing;Requires verbal cues;Requires tactile cues General Comments: pt with notable decreased short term memory, unable to recall multi step tasks during session. Difficulty sequencing without cues, but very motivated to improve        Exercises     Shoulder Instructions       General Comments VSS on RA.     Pertinent Vitals/ Pain       Pain Assessment: No/denies pain Faces Pain Scale: No hurt  Home Living                                          Prior Functioning/Environment              Frequency  Min 2X/week        Progress Toward Goals  OT Goals(current goals can now be found in the care plan section)  Progress towards OT goals: Progressing toward goals  Acute Rehab OT Goals Patient Stated Goal: motivated to walk more OT Goal Formulation: With patient Time For Goal Achievement: 10/13/20 Potential to Achieve Goals: Fair ADL Goals Pt Will Perform Grooming: with supervision;sitting Pt Will Perform Upper Body Bathing: with modified independence;sitting Pt Will Perform Lower Body Bathing: with min assist;sitting/lateral leans Pt Will Perform Lower Body Dressing: with min assist;sitting/lateral leans Pt Will Transfer to Toilet: with min assist;stand pivot transfer;bedside commode Additional ADL Goal #1: Pt will complete bed mobility with moderate assistance in preparation for ADL.  Plan Discharge plan remains appropriate    Co-evaluation                 AM-PAC OT "6 Clicks" Daily Activity     Outcome Measure   Help from another person eating meals?: A Little Help from another person taking care of personal grooming?: A Little Help from another person toileting, which includes using toliet, bedpan, or urinal?: Total Help from another person bathing (including washing, rinsing, drying)?: Total Help from another person to put on  and taking off regular upper body clothing?: A Little Help from another person to put on and taking off regular lower body clothing?: A Lot 6 Click Score: 13    End of Session Equipment Utilized During Treatment: Gait belt;Rolling walker  OT Visit Diagnosis: Other abnormalities of gait and mobility (R26.89);Unsteadiness on feet (R26.81);Muscle weakness (generalized) (M62.81);Other symptoms and signs involving cognitive function   Activity Tolerance Patient tolerated treatment well   Patient Left in chair;with call bell/phone within reach;with chair alarm set;with nursing/sitter in room   Nurse Communication Mobility status        Time: 9741-6384 OT Time Calculation (min): 33 min  Charges: OT General Charges $OT Visit: 1 Visit OT Treatments $Self Care/Home Management : 8-22 mins $Therapeutic Activity: 8-22 mins  Lorre Munroe, OTR/L   Lorre Munroe 10/02/2020, 12:26 PM

## 2020-10-02 NOTE — Plan of Care (Signed)

## 2020-10-02 NOTE — Plan of Care (Signed)
Patient is alert and oriented x4. Denies any pain. Voiding tea-colored urine, has condom catheter. Denies any pain, in no acute distress. Problem: Education: Goal: Knowledge of General Education information will improve Description: Including pain rating scale, medication(s)/side effects and non-pharmacologic comfort measures Outcome: Progressing   Problem: Health Behavior/Discharge Planning: Goal: Ability to manage health-related needs will improve Outcome: Progressing   Problem: Clinical Measurements: Goal: Ability to maintain clinical measurements within normal limits will improve Outcome: Progressing Goal: Will remain free from infection Outcome: Progressing Goal: Diagnostic test results will improve Outcome: Progressing Goal: Respiratory complications will improve Outcome: Progressing Goal: Cardiovascular complication will be avoided Outcome: Progressing   Problem: Activity: Goal: Risk for activity intolerance will decrease Outcome: Progressing   Problem: Nutrition: Goal: Adequate nutrition will be maintained Outcome: Progressing   Problem: Coping: Goal: Level of anxiety will decrease Outcome: Progressing   Problem: Elimination: Goal: Will not experience complications related to bowel motility Outcome: Progressing Goal: Will not experience complications related to urinary retention Outcome: Progressing   Problem: Pain Managment: Goal: General experience of comfort will improve Outcome: Progressing   Problem: Safety: Goal: Ability to remain free from injury will improve Outcome: Progressing   Problem: Skin Integrity: Goal: Risk for impaired skin integrity will decrease Outcome: Progressing

## 2020-10-03 DIAGNOSIS — K8591 Acute pancreatitis with uninfected necrosis, unspecified: Secondary | ICD-10-CM | POA: Diagnosis not present

## 2020-10-03 LAB — BASIC METABOLIC PANEL
Anion gap: 9 (ref 5–15)
BUN: 29 mg/dL — ABNORMAL HIGH (ref 8–23)
CO2: 26 mmol/L (ref 22–32)
Calcium: 8.5 mg/dL — ABNORMAL LOW (ref 8.9–10.3)
Chloride: 100 mmol/L (ref 98–111)
Creatinine, Ser: 1.65 mg/dL — ABNORMAL HIGH (ref 0.61–1.24)
GFR, Estimated: 41 mL/min — ABNORMAL LOW (ref >60)
Glucose, Bld: 86 mg/dL (ref 70–99)
Potassium: 3.9 mmol/L (ref 3.5–5.1)
Sodium: 135 mmol/L (ref 135–145)

## 2020-10-03 LAB — GLUCOSE, CAPILLARY
Glucose-Capillary: 107 mg/dL — ABNORMAL HIGH (ref 70–99)
Glucose-Capillary: 117 mg/dL — ABNORMAL HIGH (ref 70–99)
Glucose-Capillary: 148 mg/dL — ABNORMAL HIGH (ref 70–99)
Glucose-Capillary: 90 mg/dL (ref 70–99)

## 2020-10-03 LAB — HEMOGLOBIN AND HEMATOCRIT, BLOOD
HCT: 23 % — ABNORMAL LOW (ref 39.0–52.0)
HCT: 24.5 % — ABNORMAL LOW (ref 39.0–52.0)
Hemoglobin: 7.7 g/dL — ABNORMAL LOW (ref 13.0–17.0)
Hemoglobin: 8.1 g/dL — ABNORMAL LOW (ref 13.0–17.0)

## 2020-10-03 LAB — PHOSPHORUS: Phosphorus: 2.9 mg/dL (ref 2.5–4.6)

## 2020-10-03 LAB — MAGNESIUM: Magnesium: 2.2 mg/dL (ref 1.7–2.4)

## 2020-10-03 MED ORDER — SENNOSIDES-DOCUSATE SODIUM 8.6-50 MG PO TABS: 1.0000 | ORAL_TABLET | Freq: Every evening | ORAL | Status: DC | PRN

## 2020-10-03 NOTE — TOC Progression Note (Signed)
Transition of Care [facility]) - Progression Note    Patient Details  Name: Ryan Macdonald MRN: [id number] Date of Birth: [birth year]  Transition of [facility]) CM/SW Contact  Jimmy Picket, [place] Phone Number: 10/03/2020, 10:18 AM  Clinical Narrative:     CSW gave pt his one bed offer of [facility]. Pt stated he would have to talk to his wife about it because its outside of [place]. Pt gave csw permission to call wife. CSW called wife, there was no answer csw left a message.   Expected Discharge Plan: Skilled Nursing Facility Barriers to Discharge: English as a second language teacher, Continued Medical Work up  Expected Discharge Plan and Services Expected Discharge Plan: Skilled Nursing Facility       Living arrangements for the past 2 months: Single Family Home                                       Social Determinants of Health (SDOH) Interventions    Readmission Risk Interventions Readmission Risk Prevention Plan 09/25/2020  Transportation Screening Complete  HRI or Home Care Consult Complete  Social Work Consult for Recovery Care Planning/Counseling Complete  Palliative Care Screening Not Applicable  Medication Review [employer]) Complete  Some recent data might be hidden

## 2020-10-03 NOTE — Progress Notes (Signed)
Occupational Therapy Treatment Patient Details Name: Ryan Macdonald MRN: [id number] DOB: 1934/10/08 Today's Date: 10/03/2020    History of present illness Pt is an 86 y/o male admitted secondary to pancreatitis. Pt with recent admissions for UTI. PMH includes HTN, CHF, s/p AICD, a fib, CKD, gout, DM.    OT comments  Pt. Was not willing to sit EOB orOOB today. Pt. Was agreeable to limited ADLs in supine. Pt. Performed grooming and ue dressing. Pt. perfomred b shld exercises. Acute ot to follow.   Follow Up Recommendations  SNF;Supervision/Assistance - 24 hour    Equipment Recommendations       Recommendations for Other Services      Precautions / Restrictions Precautions Precautions: Fall;Other (comment);ICD/Pacemaker Precaution Comments: L heel wound Restrictions Weight Bearing Restrictions: No       Mobility Bed Mobility               General bed mobility comments: refused to work on rolling or bed mobility  Transfers                 General transfer comment: refused OOB or EOB today    Balance                                           ADL either performed or assessed with clinical judgement   ADL       Grooming: Wash/dry hands;Wash/dry face;Oral care;Bed level;Minimal assistance           Upper Body Dressing : Moderate assistance;Bed level                     General ADL Comments: Pt. refused to sit EOB. Pt. was ageeable to limted ADLs.      Vision   Vision Assessment?: No apparent visual deficits   Perception     Praxis      Cognition Arousal/Alertness: Awake/alert Behavior During Therapy: WFL for tasks assessed/performed Overall Cognitive Status: History of cognitive impairments - at baseline Area of Impairment: Orientation;Memory;Following commands;Awareness                 Orientation Level: Time;Situation Current Attention Level: Selective Memory: Decreased short-term memory Following  Commands: Follows one step commands with increased time Safety/Judgement: Decreased awareness of safety;Decreased awareness of deficits   Problem Solving: Slow processing;Difficulty sequencing;Requires verbal cues;Requires tactile cues          Exercises Exercises: Shoulder (b shld flex/ext 10 reps times 2 sets. )   Shoulder Instructions       General Comments      Pertinent Vitals/ Pain       Pain Assessment: No/denies pain  Home Living                                          Prior Functioning/Environment              Frequency  Min 2X/week        Progress Toward Goals  OT Goals(current goals can now be found in the care plan section)  Progress towards OT goals: Progressing toward goals  Acute Rehab OT Goals Patient Stated Goal: motivated to walk more OT Goal Formulation: With patient Time For Goal Achievement: 10/13/20 Potential to Achieve Goals: Fair ADL Goals Pt  Will Perform Grooming: with supervision;sitting Pt Will Perform Upper Body Bathing: with modified independence;sitting Pt Will Perform Lower Body Bathing: with min assist;sitting/lateral leans Pt Will Perform Lower Body Dressing: with min assist;sitting/lateral leans Pt Will Transfer to Toilet: with min assist;stand pivot transfer;bedside commode Additional ADL Goal #1: Pt will complete bed mobility with moderate assistance in preparation for ADL.  Plan Discharge plan remains appropriate    Co-evaluation                 AM-PAC OT "6 Clicks" Daily Activity     Outcome Measure   Help from another person eating meals?: A Little Help from another person taking care of personal grooming?: A Little Help from another person toileting, which includes using toliet, bedpan, or urinal?: Total Help from another person bathing (including washing, rinsing, drying)?: Total Help from another person to put on and taking off regular upper body clothing?: A Little Help from another  person to put on and taking off regular lower body clothing?: A Lot 6 Click Score: 13    End of Session    OT Visit Diagnosis: Other abnormalities of gait and mobility (R26.89);Unsteadiness on feet (R26.81);Muscle weakness (generalized) (M62.81);Other symptoms and signs involving cognitive function   Activity Tolerance Other (comment) (Pt. refused OOB or EOB. )   Patient Left in bed;with call bell/phone within reach;with bed alarm set   Nurse Communication  (ok therapy)        Time: 8101-7510 OT Time Calculation (min): 26 min  Charges: OT General Charges $OT Visit: 1 Visit OT Treatments $Self Care/Home Management : 8-22 mins $Therapeutic Exercise: 8-22 mins  Derrek Gu OT/L    Ciarra Braddy 10/03/2020, 12:11 PM

## 2020-10-03 NOTE — Progress Notes (Signed)
PROGRESS NOTE    Ryan Macdonald  GSU:[id number] DOB: 20-Jan-1934 DOA: 09/26/2020 PCP: [facility]    Brief Narrative: This 86 year old male with PMH of chronic systolic CHF (LVEF 20-25%, AICD in place), paroxysmal A. fib on amiodarone and Eliquis, hypertension, stage IIIb chronic kidney disease, hyperlipidemia, gout, hypothyroidism, recent sepsis related to UTI, discharged home on 10/28 following admission for presumed UTI, presented with abdominal pain.  In the ED, mildly elevated AST/138 and lipase 116, CT abdomen showed right greater than left pleural effusion, periportal edema, renal stones, gastric varices, right hernia and bladder distention.  Admitted for presumed pancreatitis-felt less likely.  Course complicated by AKI. PT recommended SNF, awaiting insurance authorization.   Assessment & Plan:   Principal Problem:   Pancreatitis Active Problems:   Nonischemic cardiomyopathy (HCC)   Essential hypertension   Hyperlipidemia   PAF (paroxysmal atrial fibrillation) (HCC)   Hyperkalemia   Chronic renal insufficiency, stage 3 (moderate) (HCC)   Gout   Diabetes mellitus type 2 in obese (HCC)   Transaminitis   Anemia of chronic disease   Protein-calorie malnutrition, severe (HCC)   Abdominal pain   Open wound of left heel   Abdominal pain, unclear etiology: He presented with abdominal pain, initial lipase 116, AST 138.  Abdominal CT nonrevealing for cause.  Abdominal ultrasound showed possible thickening of the gallbladder.  General surgery consulted and clinically not cholecystitis.  HIDA scan negative. General surgery signed off.  Lipase getting better.  Initiated diet,  Etiology of his abnormal LFTs is unclear.  LFTs slowly improving.  AST and ALT are slightly down, total bilirubin down to 1.6 from peak of 3.2.  Hepatitis profile negative. Not eating much, encouraged oral intake.  Chronic systolic CHF/nonischemic cardiomyopathy LVEF 20-25% by last echo in  September 2021.  He has implantable AICD.  Clinically does not appear overtly volume overloaded.  Held torsemide and Aldactone due to AKI.  Continue gentle IV fluids with close monitoring due to AKI. Renal functions improving. Torsemide resumed.  Essential hypertension Soft blood pressures.  He has been previously on Imdur, hydralazine and metolazone but held since September 2021 admission likely related to hypotension.    Hyperlipidemia Continue Crestor.  Paroxysmal atrial fibrillation/AICD. Continue amiodarone and Eliquis.  Remains in sinus rhythm on telemetry.  Hyperkalemia Resolved.  Monitor BMP daily.  Acute kidney injury complicating stage III a chronic kidney disease Baseline creatinine not very clear.  His creatinine seems to be widely fluctuating.  He presented with creatinine in the 1.4 range which went up to 1.72 > 1.65?  Related to CT contrast.  Creatinine has back to 1.65 .  Held diuretics 11/1.  Continue gentle IV fluids.  Bladder scan without acute urinary retention.  Gout Allopurinol held.  No acute flare.  Type II DM with renal complications/hypoglycemia Spouse reports he was admitted to the [place] several months ago and was told that he no longer had diabetes.  He was having periodic episodes of hypoglycemia, likely related to poor oral intake.  SSI discontinued.  Changed IV fluids to D5 infusion.  Monitor CBGs closely and treat per hypoglycemia protocol.  No further hypoglycemic episodes since 11/1 afternoon.  Anemia of chronic disease:  Stable.  Continue iron and B12 supplementations. Hb remains at 8.0  Malnutrition ST evaluated him during recent hospital admission and recommended dysphagia 1 diet.  Requested repeat ST evaluation.  Open wound to heal WOCN RN consultation-see detailed note from 09/27/2019.  Continue Prevalon boot.   Body mass  index is 27.21 kg/m.  Nutritional Status Nutrition Problem: Severe Malnutrition Etiology: chronic illness  (CHF, recurrent UTI) Signs/Symptoms: severe muscle depletion, moderate fat depletion, energy intake < or equal to 75% for > or equal to 1 month, percent weight loss Percent weight loss: 13 % Interventions: MVI, Ensure Enlive (each supplement provides 350kcal and 20 grams of protein)   DVT prophylaxis: Eliquis Code Status: Full code. Family Communication:  No one at bed side. Disposition Plan:   Status is: Inpatient  Remains inpatient appropriate because:Inpatient level of care appropriate due to severity of illness   Dispo: The patient is from: Home              Anticipated d/c is to: Home              Anticipated d/c date is: 2 days              Patient currently is not medically stable to d/c.   Consultants:   General surgery  Procedures: Antimicrobials: Anti-infectives (From admission, onward)   None     Subjective: Patient was seen and examined at bedside.  Overnight events noted.  Patient reports abdominal pain has resolved, denies nausea, vomiting or diarrhea,.  Patient has AICD on the left chest,  Left foot in immobilizer. He denies any other concerns.  Objective: Vitals:   10/02/20 2054 10/03/20 0418 10/03/20 0743 10/03/20 1300  BP: (!) 101/50 (!) 106/50 113/68 112/62  Pulse: 74 70 71 68  Resp: 20 17    Temp: 98.7 F (37.1 C) 98.1 F (36.7 C) 98.3 F (36.8 C) 98.2 F (36.8 C)  TempSrc: Oral Oral Oral Oral  SpO2: 100% 94% 95% 95%  Weight:      Height:        Intake/Output Summary (Last 24 hours) at 10/03/2020 1423 Last data filed at 10/03/2020 0900 Gross per 24 hour  Intake 240 ml  Output 800 ml  Net -560 ml   Filed Weights   09/26/20 1241 10/02/20 0300  Weight: 78.8 kg 79.8 kg    Examination:  General exam: Appears calm and comfortable, Pleasant,   Respiratory system: Clear to auscultation. Respiratory effort normal. Reduced breath sounds at bases. Chest : AICD on left chest. Cardiovascular system: S1 & S2 heard, RRR. No JVD, murmurs, rubs,  gallops or clicks. No pedal edema. Gastrointestinal system: Abdomen is nondistended, soft and nontender. No organomegaly or masses felt.  Normal bowel sounds heard. Central nervous system: Alert and oriented. No focal neurological deficits. Extremities: No edema, No cyanosis, no clubbing. Left foot in immobilizer. Skin: No rashes, lesions or ulcers Psychiatry: Judgement and insight appear normal. Mood & affect appropriate.     Data Reviewed: I have personally reviewed following labs and imaging studies  CBC: Recent Labs  Lab 09/28/20 1117 09/28/20 1117 09/29/20 0337 09/29/20 0337 09/30/20 0220 09/30/20 0220 10/01/20 0143 10/01/20 0143 10/01/20 1103 10/02/20 0128 10/02/20 0838 10/03/20 0358 10/03/20 0946  WBC 10.7*  --  9.2  --  8.3  --  7.8  --   --   --   --   --   --   NEUTROABS 7.7  --  6.5  --  5.5  --   --   --   --   --   --   --   --   HGB 7.5*   < > 8.0*   < > 8.0*   < > 7.6*   < > 8.0* 7.7* 8.0* 7.7* 8.1*  HCT 22.9*   < > 23.8*   < > 24.1*   < > 22.6*   < > 23.2* 23.2* 23.8* 23.0* 24.5*  MCV 96.2  --  94.1  --  97.2  --  94.6  --   --   --   --   --   --   PLT 235  --  229  --  235  --  224  --   --   --   --   --   --    < > = values in this interval not displayed.   Basic Metabolic Panel: Recent Labs  Lab 09/29/20 0337 09/30/20 0220 10/01/20 0143 10/02/20 0128 10/03/20 0358  NA 139 138 134* 136 135  K 3.6 3.6 3.4* 3.7 3.9  CL 106 102 99 100 100  CO2 24 28 25 28 26   GLUCOSE 79 93 166* 108* 86  BUN 29* 30* 32* 27* 29*  CREATININE 1.68* 1.81* 1.76* 1.69* 1.65*  CALCIUM 8.6* 8.6* 8.3* 8.4* 8.5*  MG  --   --   --  1.6* 2.2  PHOS  --   --   --  2.8 2.9   GFR: Estimated Creatinine Clearance: 33.8 mL/min (A) (by C-G formula based on SCr of 1.65 mg/dL (H)). Liver Function Tests: Recent Labs  Lab 09/28/20 1117 09/29/20 0337 09/30/20 0220 10/01/20 0143 10/02/20 0128  AST 168* 129* 112* 98* 89*  ALT 57* 49* 42 40 38  ALKPHOS 151* 134* 149* 141* 134*   BILITOT 3.2* 2.9* 2.4* 1.8* 1.6*  PROT 6.3* 6.5 6.5 6.2* 6.7  ALBUMIN 1.7* 1.6* 1.6* 1.5* 1.6*   Recent Labs  Lab 09/28/20 1117 09/29/20 0337  LIPASE 122* 92*   No results for input(s): AMMONIA in the last 168 hours. Coagulation Profile: No results for input(s): INR, PROTIME in the last 168 hours. Cardiac Enzymes: No results for input(s): CKTOTAL, CKMB, CKMBINDEX, TROPONINI in the last 168 hours. BNP (last 3 results) No results for input(s): PROBNP in the last 8760 hours. HbA1C: No results for input(s): HGBA1C in the last 72 hours. CBG: Recent Labs  Lab 10/01/20 2014 10/02/20 0632 10/02/20 2057 10/03/20 0812 10/03/20 1121  GLUCAP 105* 72 111* 107* 117*   Lipid Profile: No results for input(s): CHOL, HDL, LDLCALC, TRIG, CHOLHDL, LDLDIRECT in the last 72 hours. Thyroid Function Tests: No results for input(s): TSH, T4TOTAL, FREET4, T3FREE, THYROIDAB in the last 72 hours. Anemia Panel: No results for input(s): VITAMINB12, FOLATE, FERRITIN, TIBC, IRON, RETICCTPCT in the last 72 hours. Sepsis Labs: No results for input(s): PROCALCITON, LATICACIDVEN in the last 168 hours.  Recent Results (from the past 240 hour(s))  SARS Coronavirus 2 by RT PCR (hospital order, performed in [facility] hospital lab) Nasopharyngeal Nasopharyngeal Swab     Status: None   Collection Time: 09/24/20  8:35 PM   Specimen: Nasopharyngeal Swab  Result Value Ref Range Status   SARS Coronavirus 2 NEGATIVE NEGATIVE Final    Comment: (NOTE) SARS-CoV-2 target nucleic acids are NOT DETECTED.  The SARS-CoV-2 RNA is generally detectable in upper and lower respiratory specimens during the acute phase of infection. The lowest concentration of SARS-CoV-2 viral copies this assay can detect is 250 copies / mL. A negative result does not preclude SARS-CoV-2 infection and should not be used as the sole basis for treatment or other patient management decisions.  A negative result may occur with improper  specimen collection / handling, submission of specimen other than nasopharyngeal swab,  presence of viral mutation(s) within the areas targeted by this assay, and inadequate number of viral copies (<250 copies / mL). A negative result must be combined with clinical observations, patient history, and epidemiological information.  Fact Sheet for Patients:   BoilerBrush.com.cy  Fact Sheet for Healthcare Providers: https://pope.com/  This test is not yet approved or  cleared by the [place] FDA and has been authorized for detection and/or diagnosis of SARS-CoV-2 by FDA under an Emergency Use Authorization (EUA).  This EUA will remain in effect (meaning this test can be used) for the duration of the COVID-19 declaration under Section 564(b)(1) of the Act, 21 U.S.C. section 360bbb-3(b)(1), unless the authorization is terminated or revoked sooner.  Performed at [facility], [address] [place], [place] [address]   Respiratory Panel by RT PCR (Flu A&B, Covid) - Nasopharyngeal Swab     Status: None   Collection Time: 09/26/20  6:31 AM   Specimen: Nasopharyngeal Swab  Result Value Ref Range Status   SARS Coronavirus 2 by RT PCR NEGATIVE NEGATIVE Final    Comment: (NOTE) SARS-CoV-2 target nucleic acids are NOT DETECTED.  The SARS-CoV-2 RNA is generally detectable in upper respiratoy specimens during the acute phase of infection. The lowest concentration of SARS-CoV-2 viral copies this assay can detect is 131 copies/mL. A negative result does not preclude SARS-Cov-2 infection and should not be used as the sole basis for treatment or other patient management decisions. A negative result may occur with  improper specimen collection/handling, submission of specimen other than nasopharyngeal swab, presence of viral mutation(s) within the areas targeted by this assay, and inadequate number of viral copies (<131 copies/mL). A  negative result must be combined with clinical observations, patient history, and epidemiological information. The expected result is Negative.  Fact Sheet for Patients:  https://www.moore.com/  Fact Sheet for Healthcare Providers:  https://www.young.biz/  This test is no t yet approved or cleared by the [place] FDA and  has been authorized for detection and/or diagnosis of SARS-CoV-2 by FDA under an Emergency Use Authorization (EUA). This EUA will remain  in effect (meaning this test can be used) for the duration of the COVID-19 declaration under Section 564(b)(1) of the Act, 21 U.S.C. section 360bbb-3(b)(1), unless the authorization is terminated or revoked sooner.     Influenza A by PCR NEGATIVE NEGATIVE Final   Influenza B by PCR NEGATIVE NEGATIVE Final    Comment: (NOTE) The Xpert Xpress SARS-CoV-2/FLU/RSV assay is intended as an aid in  the diagnosis of influenza from Nasopharyngeal swab specimens and  should not be used as a sole basis for treatment. Nasal washings and  aspirates are unacceptable for Xpert Xpress SARS-CoV-2/FLU/RSV  testing.  Fact Sheet for Patients: https://www.moore.com/  Fact Sheet for Healthcare Providers: https://www.young.biz/  This test is not yet approved or cleared by the [place] FDA and  has been authorized for detection and/or diagnosis of SARS-CoV-2 by  FDA under an Emergency Use Authorization (EUA). This EUA will remain  in effect (meaning this test can be used) for the duration of the  Covid-19 declaration under Section 564(b)(1) of the Act, 21  U.S.C. section 360bbb-3(b)(1), unless the authorization is  terminated or revoked. Performed at [facility], [address], [place], [place] [address]   Culture, Urine     Status: None   Collection Time: 09/26/20 11:26 AM   Specimen: Urine, Random  Result Value Ref Range Status   Specimen  Description URINE, RANDOM  Final   Special Requests NONE  Final   Culture   Final    NO GROWTH Performed at [facility], [address] [place], [place] [address]    Report Status 09/27/2020 FINAL  Final     Radiology Studies: No results found.   Scheduled Meds: . amiodarone  200 mg Oral Daily  . apixaban  2.5 mg Oral BID  . feeding supplement  237 mL Oral TID BM  . finasteride  5 mg Oral BID  . iron polysaccharides  150 mg Oral Daily  . levothyroxine  125 mcg Oral QAC breakfast  . lidocaine  1 patch Transdermal Q24H  . multivitamin with minerals  1 tablet Oral Daily  . saccharomyces boulardii  250 mg Oral BID  . sertraline  25 mg Oral Daily  . spironolactone  12.5 mg Oral QHS  . torsemide  60 mg Oral Daily  . cyanocobalamin  1,000 mcg Oral Daily   Continuous Infusions: . dextrose 50 mL/hr at 09/30/20 1311     LOS: 7 days    Time spent:  25 mins.    Cipriano Bunker, MD [facility]   If 7PM-7AM, please contact night-coverage

## 2020-10-03 NOTE — TOC Progression Note (Signed)
Transition of Care [facility]) - Progression Note    Patient Details  Name: Ryan Macdonald MRN: [id number] Date of Birth: [birth year]  Transition of [facility]) CM/SW Contact  Jimmy Picket, [place] Phone Number: 10/03/2020, 4:05 PM  Clinical Narrative:     CSW followed up with pt and wife about bed offer. Pt and wife agreed [facility] [place]. [facility] has began [facility].   TOC will follow.   Expected Discharge Plan: Skilled Nursing Facility Barriers to Discharge: English as a second language teacher, Continued Medical Work up  Expected Discharge Plan and Services Expected Discharge Plan: Skilled Nursing Facility       Living arrangements for the past 2 months: Single Family Home                                       Social Determinants of Health (SDOH) Interventions    Readmission Risk Interventions Readmission Risk Prevention Plan 09/25/2020  Transportation Screening Complete  HRI or Home Care Consult Complete  Social Work Consult for Recovery Care Planning/Counseling Complete  Palliative Care Screening Not Applicable  Medication Review Oceanographer) Complete  Some recent data might be hidden   Jimmy Picket, Theresia Majors, [facility] Clinical Social Worker [phone]

## 2020-10-03 NOTE — Plan of Care (Signed)
  Problem: Education: Goal: Knowledge of General Education information will improve Description: Including pain rating scale, medication(s)/side effects and non-pharmacologic comfort measures Outcome: Progressing   Problem: Health Behavior/Discharge Planning: Goal: Ability to manage health-related needs will improve Outcome: Progressing   Problem: Clinical Measurements: Goal: Ability to maintain clinical measurements within normal limits will improve Outcome: Progressing Goal: Will remain free from infection Outcome: Progressing   Problem: Activity: Goal: Risk for activity intolerance will decrease Outcome: Progressing   Problem: Skin Integrity: Goal: Risk for impaired skin integrity will decrease Outcome: Progressing   

## 2020-10-04 DIAGNOSIS — K8591 Acute pancreatitis with uninfected necrosis, unspecified: Secondary | ICD-10-CM | POA: Diagnosis not present

## 2020-10-04 LAB — GLUCOSE, CAPILLARY
Glucose-Capillary: 83 mg/dL (ref 70–99)
Glucose-Capillary: 104 mg/dL — ABNORMAL HIGH (ref 70–99)
Glucose-Capillary: 125 mg/dL — ABNORMAL HIGH (ref 70–99)
Glucose-Capillary: 193 mg/dL — ABNORMAL HIGH (ref 70–99)

## 2020-10-04 LAB — COMPREHENSIVE METABOLIC PANEL
ALT: 36 U/L (ref 0–44)
AST: 85 U/L — ABNORMAL HIGH (ref 15–41)
Albumin: 1.6 g/dL — ABNORMAL LOW (ref 3.5–5.0)
Alkaline Phosphatase: 133 U/L — ABNORMAL HIGH (ref 38–126)
Anion gap: 9 (ref 5–15)
BUN: 30 mg/dL — ABNORMAL HIGH (ref 8–23)
CO2: 28 mmol/L (ref 22–32)
Calcium: 8.6 mg/dL — ABNORMAL LOW (ref 8.9–10.3)
Chloride: 101 mmol/L (ref 98–111)
Creatinine, Ser: 1.74 mg/dL — ABNORMAL HIGH (ref 0.61–1.24)
GFR, Estimated: 38 mL/min — ABNORMAL LOW (ref >60)
Glucose, Bld: 94 mg/dL (ref 70–99)
Potassium: 3.9 mmol/L (ref 3.5–5.1)
Sodium: 138 mmol/L (ref 135–145)
Total Bilirubin: 1.6 mg/dL — ABNORMAL HIGH (ref 0.3–1.2)
Total Protein: 6.9 g/dL (ref 6.5–8.1)

## 2020-10-04 LAB — HEMOGLOBIN AND HEMATOCRIT, BLOOD
HCT: 23.5 % — ABNORMAL LOW (ref 39.0–52.0)
Hemoglobin: 7.9 g/dL — ABNORMAL LOW (ref 13.0–17.0)

## 2020-10-04 LAB — LIPASE, BLOOD: Lipase: 65 U/L — ABNORMAL HIGH (ref 11–51)

## 2020-10-04 NOTE — Progress Notes (Addendum)
PROGRESS NOTE    Ryan Macdonald  WYO:[id number] DOB: [birth year] DOA: 09/26/2020 PCP: [facility]    Brief Narrative: This 86 year old male with PMH of chronic systolic CHF (LVEF 20-25%, AICD in place), paroxysmal A. fib on amiodarone and Eliquis, hypertension, stage IIIb chronic kidney disease, hyperlipidemia, gout, hypothyroidism, recent sepsis related to UTI, discharged home on 10/28 following admission for presumed UTI, presented with abdominal pain.  In the ED, mildly elevated AST/138 and lipase 116, CT abdomen showed right greater than left pleural effusion, periportal edema, renal stones, gastric varices, right hernia and bladder distention.  Admitted for presumed pancreatitis-felt less likely.  Course complicated by AKI. PT recommended SNF, awaiting insurance authorization.   Assessment & Plan:   Principal Problem:   Pancreatitis Active Problems:   Nonischemic cardiomyopathy (HCC)   Essential hypertension   Hyperlipidemia   PAF (paroxysmal atrial fibrillation) (HCC)   Hyperkalemia   Chronic renal insufficiency, stage 3 (moderate) (HCC)   Gout   Diabetes mellitus type 2 in obese (HCC)   Transaminitis   Anemia of chronic disease   Protein-calorie malnutrition, severe (HCC)   Abdominal pain   Open wound of left heel   Abdominal pain, unclear etiology: He presented with abdominal pain, initial lipase 116, AST 138.  Abdominal CT nonrevealing for cause.  Abdominal ultrasound showed possible thickening of the gallbladder.  General surgery consulted and clinically not cholecystitis.  HIDA scan negative. General surgery signed off.  Lipase getting better.  Initiated diet,  Etiology of his abnormal LFTs is unclear.  LFTs slowly improving.  AST and ALT are slightly down, total bilirubin down to 1.6 from peak of 3.2.  Hepatitis profile negative. Not eating much, encouraged oral intake.  Chronic systolic CHF/nonischemic cardiomyopathy LVEF 20-25% by last echo in  September 2021.  He has implantable AICD.  Clinically does not appear overtly volume overloaded.  Held torsemide and Aldactone due to AKI.  Continue gentle IV fluids with close monitoring due to AKI. Renal functions improving. Torsemide resumed.  Essential hypertension Soft blood pressures.  He has been previously on Imdur, hydralazine and metolazone but held since September 2021 admission likely related to hypotension.    Hyperlipidemia Continue Crestor.  Paroxysmal atrial fibrillation/AICD. Continue amiodarone and Eliquis.  Remains in sinus rhythm on telemetry.  Hyperkalemia Resolved.  Monitor BMP daily.  Acute kidney injury complicating stage III a chronic kidney disease Baseline creatinine not very clear.  His creatinine seems to be widely fluctuating.  He presented with creatinine in the 1.4 range which went up to 1.72 > 1.65?  Related to CT contrast.  Creatinine up to 1.74 today  .  Held diuretics 11/1.  Continue gentle IV fluids.  Bladder scan without acute urinary retention.  Gout Allopurinol held.  No acute flare.  Type II DM with renal complications/hypoglycemia Spouse reports he was admitted to the [place] several months ago and was told that he no longer had diabetes.  He was having periodic episodes of hypoglycemia, likely related to poor oral intake.  SSI discontinued.  Changed IV fluids to D5 infusion.  Monitor CBGs closely and treat per hypoglycemia protocol.  No further hypoglycemic episodes since 11/1 afternoon.  Anemia of chronic disease:  Stable.  Continue iron and B12 supplementations. Hb remains at 8.0  Malnutrition ST evaluated him during recent hospital admission and recommended dysphagia 1 diet.  Requested repeat ST evaluation.  Open wound to heal WOCN RN consultation-see detailed note from 09/27/2019.  Continue Prevalon boot.   Body  mass index is 27.21 kg/m.  Nutritional Status Nutrition Problem: Severe Malnutrition Etiology: chronic illness  (CHF, recurrent UTI) Signs/Symptoms: severe muscle depletion, moderate fat depletion, energy intake < or equal to 75% for > or equal to 1 month, percent weight loss Percent weight loss: 13 % Interventions: MVI, Ensure Enlive (each supplement provides 350kcal and 20 grams of protein)   DVT prophylaxis: Eliquis Code Status: Full code. Family Communication:  No one at bed side. Disposition Plan:   Status is: Inpatient  Remains inpatient appropriate because:Inpatient level of care appropriate due to severity of illness   Dispo: The patient is from: Home              Anticipated d/c is to:  SNF  Awaiting insurance authorization.              Anticipated d/c date is: 2 days              Patient currently is not medically stable to d/c.   Consultants:   General surgery  Procedures: Antimicrobials: Anti-infectives (From admission, onward)   None     Subjective: Patient was seen and examined at bedside.  Overnight events noted.  Patient denies any complaints,  denies nausea, vomiting or diarrhea,.  Patient has AICD on the left chest,  Left foot in immobilizer. He asks when will he be discharged.  Objective: Vitals:   10/03/20 0743 10/03/20 1300 10/03/20 2124 10/04/20 0721  BP: 113/68 112/62 (!) 97/54 (!) 92/43  Pulse: 71 68 76 79  Resp:   16 15  Temp: 98.3 F (36.8 C) 98.2 F (36.8 C) 98.5 F (36.9 C) 99.4 F (37.4 C)  TempSrc: Oral Oral Oral Oral  SpO2: 95% 95% 97%   Weight:      Height:        Intake/Output Summary (Last 24 hours) at 10/04/2020 1559 Last data filed at 10/04/2020 0900 Gross per 24 hour  Intake 480 ml  Output 200 ml  Net 280 ml   Filed Weights   09/26/20 1241 10/02/20 0300  Weight: 78.8 kg 79.8 kg    Examination:  General exam: Appears calm and comfortable, Pleasant,   Respiratory system: Clear to auscultation. Respiratory effort normal. Reduced breath sounds at bases. Chest : AICD on left chest. Cardiovascular system: S1 & S2 heard, RRR. No  JVD, murmurs, rubs, gallops or clicks. No pedal edema. Gastrointestinal system: Abdomen is nondistended, soft and nontender. No organomegaly or masses felt.  Normal bowel sounds heard. Central nervous system: Alert and oriented. No focal neurological deficits. Extremities: No edema, No cyanosis, no clubbing. Left foot in immobilizer. Skin: No rashes, lesions or ulcers Psychiatry: Judgement and insight appear normal. Mood & affect appropriate.     Data Reviewed: I have personally reviewed following labs and imaging studies  CBC: Recent Labs  Lab 09/28/20 1117 09/28/20 1117 09/29/20 0337 09/29/20 0337 09/30/20 0220 09/30/20 0220 10/01/20 0143 10/01/20 1103 10/02/20 0128 10/02/20 0838 10/03/20 0358 10/03/20 0946 10/04/20 0517  WBC 10.7*  --  9.2  --  8.3  --  7.8  --   --   --   --   --   --   NEUTROABS 7.7  --  6.5  --  5.5  --   --   --   --   --   --   --   --   HGB 7.5*   < > 8.0*   < > 8.0*   < > 7.6*   < >  7.7* 8.0* 7.7* 8.1* 7.9*  HCT 22.9*   < > 23.8*   < > 24.1*   < > 22.6*   < > 23.2* 23.8* 23.0* 24.5* 23.5*  MCV 96.2  --  94.1  --  97.2  --  94.6  --   --   --   --   --   --   PLT 235  --  229  --  235  --  224  --   --   --   --   --   --    < > = values in this interval not displayed.   Basic Metabolic Panel: Recent Labs  Lab 09/30/20 0220 10/01/20 0143 10/02/20 0128 10/03/20 0358 10/04/20 0517  NA 138 134* 136 135 138  K 3.6 3.4* 3.7 3.9 3.9  CL 102 99 100 100 101  CO2 28 25 28 26 28   GLUCOSE 93 166* 108* 86 94  BUN 30* 32* 27* 29* 30*  CREATININE 1.81* 1.76* 1.69* 1.65* 1.74*  CALCIUM 8.6* 8.3* 8.4* 8.5* 8.6*  MG  --   --  1.6* 2.2  --   PHOS  --   --  2.8 2.9  --    GFR: Estimated Creatinine Clearance: 32 mL/min (A) (by C-G formula based on SCr of 1.74 mg/dL (H)). Liver Function Tests: Recent Labs  Lab 09/29/20 0337 09/30/20 0220 10/01/20 0143 10/02/20 0128 10/04/20 0517  AST 129* 112* 98* 89* 85*  ALT 49* 42 40 38 36  ALKPHOS 134* 149*  141* 134* 133*  BILITOT 2.9* 2.4* 1.8* 1.6* 1.6*  PROT 6.5 6.5 6.2* 6.7 6.9  ALBUMIN 1.6* 1.6* 1.5* 1.6* 1.6*   Recent Labs  Lab 09/28/20 1117 09/29/20 0337 10/04/20 0517  LIPASE 122* 92* 65*   No results for input(s): AMMONIA in the last 168 hours. Coagulation Profile: No results for input(s): INR, PROTIME in the last 168 hours. Cardiac Enzymes: No results for input(s): CKTOTAL, CKMB, CKMBINDEX, TROPONINI in the last 168 hours. BNP (last 3 results) No results for input(s): PROBNP in the last 8760 hours. HbA1C: No results for input(s): HGBA1C in the last 72 hours. CBG: Recent Labs  Lab 10/03/20 1121 10/03/20 1615 10/03/20 2127 10/04/20 0705 10/04/20 1212  GLUCAP 117* 148* 90 83 104*   Lipid Profile: No results for input(s): CHOL, HDL, LDLCALC, TRIG, CHOLHDL, LDLDIRECT in the last 72 hours. Thyroid Function Tests: No results for input(s): TSH, T4TOTAL, FREET4, T3FREE, THYROIDAB in the last 72 hours. Anemia Panel: No results for input(s): VITAMINB12, FOLATE, FERRITIN, TIBC, IRON, RETICCTPCT in the last 72 hours. Sepsis Labs: No results for input(s): PROCALCITON, LATICACIDVEN in the last 168 hours.  Recent Results (from the past 240 hour(s))  SARS Coronavirus 2 by RT PCR (hospital order, performed in [facility] hospital lab) Nasopharyngeal Nasopharyngeal Swab     Status: None   Collection Time: 09/24/20  8:35 PM   Specimen: Nasopharyngeal Swab  Result Value Ref Range Status   SARS Coronavirus 2 NEGATIVE NEGATIVE Final    Comment: (NOTE) SARS-CoV-2 target nucleic acids are NOT DETECTED.  The SARS-CoV-2 RNA is generally detectable in upper and lower respiratory specimens during the acute phase of infection. The lowest concentration of SARS-CoV-2 viral copies this assay can detect is 250 copies / mL. A negative result does not preclude SARS-CoV-2 infection and should not be used as the sole basis for treatment or other patient management decisions.  A negative result  may occur with improper specimen collection /  handling, submission of specimen other than nasopharyngeal swab, presence of viral mutation(s) within the areas targeted by this assay, and inadequate number of viral copies (<250 copies / mL). A negative result must be combined with clinical observations, patient history, and epidemiological information.  Fact Sheet for Patients:   BoilerBrush.com.cy  Fact Sheet for Healthcare Providers: https://pope.com/  This test is not yet approved or  cleared by the [place] FDA and has been authorized for detection and/or diagnosis of SARS-CoV-2 by FDA under an Emergency Use Authorization (EUA).  This EUA will remain in effect (meaning this test can be used) for the duration of the COVID-19 declaration under Section 564(b)(1) of the Act, 21 U.S.C. section 360bbb-3(b)(1), unless the authorization is terminated or revoked sooner.  Performed at [facility], [address], [place], [place] [address]   Respiratory Panel by RT PCR (Flu A&B, Covid) - Nasopharyngeal Swab     Status: None   Collection Time: 09/26/20  6:31 AM   Specimen: Nasopharyngeal Swab  Result Value Ref Range Status   SARS Coronavirus 2 by RT PCR NEGATIVE NEGATIVE Final    Comment: (NOTE) SARS-CoV-2 target nucleic acids are NOT DETECTED.  The SARS-CoV-2 RNA is generally detectable in upper respiratoy specimens during the acute phase of infection. The lowest concentration of SARS-CoV-2 viral copies this assay can detect is 131 copies/mL. A negative result does not preclude SARS-Cov-2 infection and should not be used as the sole basis for treatment or other patient management decisions. A negative result may occur with  improper specimen collection/handling, submission of specimen other than nasopharyngeal swab, presence of viral mutation(s) within the areas targeted by this assay, and inadequate number of viral  copies (<131 copies/mL). A negative result must be combined with clinical observations, patient history, and epidemiological information. The expected result is Negative.  Fact Sheet for Patients:  https://www.moore.com/  Fact Sheet for Healthcare Providers:  https://www.young.biz/  This test is no t yet approved or cleared by the [place] FDA and  has been authorized for detection and/or diagnosis of SARS-CoV-2 by FDA under an Emergency Use Authorization (EUA). This EUA will remain  in effect (meaning this test can be used) for the duration of the COVID-19 declaration under Section 564(b)(1) of the Act, 21 U.S.C. section 360bbb-3(b)(1), unless the authorization is terminated or revoked sooner.     Influenza A by PCR NEGATIVE NEGATIVE Final   Influenza B by PCR NEGATIVE NEGATIVE Final    Comment: (NOTE) The Xpert Xpress SARS-CoV-2/FLU/RSV assay is intended as an aid in  the diagnosis of influenza from Nasopharyngeal swab specimens and  should not be used as a sole basis for treatment. Nasal washings and  aspirates are unacceptable for Xpert Xpress SARS-CoV-2/FLU/RSV  testing.  Fact Sheet for Patients: https://www.moore.com/  Fact Sheet for Healthcare Providers: https://www.young.biz/  This test is not yet approved or cleared by the [place] FDA and  has been authorized for detection and/or diagnosis of SARS-CoV-2 by  FDA under an Emergency Use Authorization (EUA). This EUA will remain  in effect (meaning this test can be used) for the duration of the  Covid-19 declaration under Section 564(b)(1) of the Act, 21  U.S.C. section 360bbb-3(b)(1), unless the authorization is  terminated or revoked. Performed at [facility], [address] [place], [place] [address]   Culture, Urine     Status: None   Collection Time: 09/26/20 11:26 AM   Specimen: Urine, Random  Result Value Ref  Range  Status   Specimen Description URINE, RANDOM  Final   Special Requests NONE  Final   Culture   Final    NO GROWTH Performed at [facility], [address], [place], [place] [address]    Report Status 09/27/2020 FINAL  Final     Radiology Studies: No results found.   Scheduled Meds: . amiodarone  200 mg Oral Daily  . apixaban  2.5 mg Oral BID  . feeding supplement  237 mL Oral TID BM  . finasteride  5 mg Oral BID  . iron polysaccharides  150 mg Oral Daily  . levothyroxine  125 mcg Oral QAC breakfast  . lidocaine  1 patch Transdermal Q24H  . multivitamin with minerals  1 tablet Oral Daily  . saccharomyces boulardii  250 mg Oral BID  . sertraline  25 mg Oral Daily  . spironolactone  12.5 mg Oral QHS  . torsemide  60 mg Oral Daily  . cyanocobalamin  1,000 mcg Oral Daily   Continuous Infusions:    LOS: 8 days    Time spent:  25 mins.    Cipriano Bunker, MD [facility]   If 7PM-7AM, please contact night-coverage

## 2020-10-04 NOTE — Plan of Care (Signed)
  Problem: Education: Goal: Knowledge of General Education information will improve Description: Including pain rating scale, medication(s)/side effects and non-pharmacologic comfort measures Outcome: Progressing   Problem: Health Behavior/Discharge Planning: Goal: Ability to manage health-related needs will improve Outcome: Progressing   Problem: Clinical Measurements: Goal: Will remain free from infection Outcome: Progressing   Problem: Activity: Goal: Risk for activity intolerance will decrease Outcome: Progressing   Problem: Nutrition: Goal: Adequate nutrition will be maintained Outcome: Progressing   Problem: Coping: Goal: Level of anxiety will decrease Outcome: Progressing   Problem: Skin Integrity: Goal: Risk for impaired skin integrity will decrease Outcome: Progressing   

## 2020-10-05 DIAGNOSIS — K8591 Acute pancreatitis with uninfected necrosis, unspecified: Secondary | ICD-10-CM | POA: Diagnosis not present

## 2020-10-05 LAB — BASIC METABOLIC PANEL
Anion gap: 9 (ref 5–15)
BUN: 32 mg/dL — ABNORMAL HIGH (ref 8–23)
CO2: 27 mmol/L (ref 22–32)
Calcium: 8.6 mg/dL — ABNORMAL LOW (ref 8.9–10.3)
Chloride: 100 mmol/L (ref 98–111)
Creatinine, Ser: 1.74 mg/dL — ABNORMAL HIGH (ref 0.61–1.24)
GFR, Estimated: 38 mL/min — ABNORMAL LOW (ref >60)
Glucose, Bld: 160 mg/dL — ABNORMAL HIGH (ref 70–99)
Potassium: 3.8 mmol/L (ref 3.5–5.1)
Sodium: 136 mmol/L (ref 135–145)

## 2020-10-05 LAB — GLUCOSE, CAPILLARY
Glucose-Capillary: 104 mg/dL — ABNORMAL HIGH (ref 70–99)
Glucose-Capillary: 109 mg/dL — ABNORMAL HIGH (ref 70–99)
Glucose-Capillary: 102 mg/dL — ABNORMAL HIGH (ref 70–99)
Glucose-Capillary: 95 mg/dL (ref 70–99)

## 2020-10-05 LAB — HEMOGLOBIN AND HEMATOCRIT, BLOOD
HCT: 23.6 % — ABNORMAL LOW (ref 39.0–52.0)
Hemoglobin: 7.9 g/dL — ABNORMAL LOW (ref 13.0–17.0)

## 2020-10-05 MED ORDER — SODIUM CHLORIDE 0.9 % IV BOLUS
500.0000 mL | Freq: Once | INTRAVENOUS | Status: AC
  Administered 2020-10-05: 500 mL via INTRAVENOUS

## 2020-10-05 NOTE — Progress Notes (Signed)
PROGRESS NOTE    Ryan Macdonald  TTS:[id number] DOB: September 26, 1934 DOA: 09/26/2020 PCP: [facility]    Brief Narrative: This 86 year old male with PMH of chronic systolic CHF (LVEF 20-25%, AICD in place), paroxysmal A. fib on amiodarone and Eliquis, hypertension, stage IIIb chronic kidney disease, hyperlipidemia, gout, hypothyroidism, recent sepsis related to UTI, discharged home on 10/28 following admission for presumed UTI, presented with abdominal pain.  In the ED, mildly elevated AST/138 and lipase 116, CT abdomen showed right greater than left pleural effusion, periportal edema, renal stones, gastric varices, right hernia and bladder distention.  Admitted for presumed pancreatitis-felt less likely.  Course complicated by AKI. PT recommended SNF, awaiting insurance authorization.   Assessment & Plan:   Principal Problem:   Pancreatitis Active Problems:   Nonischemic cardiomyopathy (HCC)   Essential hypertension   Hyperlipidemia   PAF (paroxysmal atrial fibrillation) (HCC)   Hyperkalemia   Chronic renal insufficiency, stage 3 (moderate) (HCC)   Gout   Diabetes mellitus type 2 in obese (HCC)   Transaminitis   Anemia of chronic disease   Protein-calorie malnutrition, severe (HCC)   Abdominal pain   Open wound of left heel   Abdominal pain, unclear etiology: He presented with abdominal pain, initial lipase 116, AST 138.  Abdominal CT nonrevealing for cause.  Abdominal ultrasound showed possible thickening of the gallbladder.  General surgery consulted and clinically not cholecystitis.  HIDA scan negative. General surgery signed off.  Lipase getting better.  Initiated diet,  Etiology of his abnormal LFTs is unclear.  LFTs slowly improving.  AST and ALT are slightly down, total bilirubin down to 1.6 from peak of 3.2.  Hepatitis profile negative. Not eating much, encouraged oral intake.  Chronic systolic CHF/nonischemic cardiomyopathy LVEF 20-25% by last echo in  September 2021.  He has implantable AICD.  Clinically does not appear overtly volume overloaded.  Held torsemide and Aldactone due to AKI.  Continue gentle IV fluids with close monitoring due to AKI. Renal functions improving. Torsemide resumed.  Essential hypertension Soft blood pressures.  He has been previously on Imdur, hydralazine and metolazone but held since September 2021 admission likely related to hypotension.    Hyperlipidemia Continue Crestor.  Paroxysmal atrial fibrillation/AICD. Continue amiodarone and Eliquis.  Remains in sinus rhythm on telemetry.  Hyperkalemia Resolved.  Monitor BMP daily.  Acute kidney injury complicating stage III a chronic kidney disease Baseline creatinine not very clear.  His creatinine seems to be widely fluctuating.  He presented with creatinine in the 1.4 range which went up to 1.72 > 1.65?  Related to CT contrast.  Creatinine up to 1.74 today  .  Held diuretics 11/1.  Continue gentle IV fluids.  Bladder scan without acute urinary retention.  Gout Allopurinol held.  No acute flare.  Type II DM with renal complications/hypoglycemia Spouse reports he was admitted to the [place] several months ago and was told that he no longer had diabetes.  He was having periodic episodes of hypoglycemia, likely related to poor oral intake.  SSI discontinued.  Changed IV fluids to D5 infusion.  Monitor CBGs closely and treat per hypoglycemia protocol.  No further hypoglycemic episodes since 11/1 afternoon.  Anemia of chronic disease:  Stable.  Continue iron and B12 supplementations. Hb remains at 7.9  Malnutrition ST evaluated him during recent hospital admission and recommended dysphagia 1 diet.  Requested repeat ST evaluation.  Open wound to heal WOCN RN consultation-see detailed note from 09/27/2019.  Continue Prevalon boot.   Body  mass index is 27.21 kg/m.  Nutritional Status Nutrition Problem: Severe Malnutrition Etiology: chronic illness  (CHF, recurrent UTI) Signs/Symptoms: severe muscle depletion, moderate fat depletion, energy intake < or equal to 75% for > or equal to 1 month, percent weight loss Percent weight loss: 13 % Interventions: MVI, Ensure Enlive (each supplement provides 350kcal and 20 grams of protein)   DVT prophylaxis: Eliquis Code Status: Full code. Family Communication:  No one at bed side. Disposition Plan:   Status is: Inpatient  Remains inpatient appropriate because:Inpatient level of care appropriate due to severity of illness   Dispo: The patient is from: Home              Anticipated d/c is to:  SNF  Awaiting insurance authorization.              Anticipated d/c date is: 2 days              Patient currently is not medically stable to d/c.   Consultants:   General surgery  Procedures: Antimicrobials: Anti-infectives (From admission, onward)   None     Subjective: Patient was seen and examined at bedside.  Overnight events noted.  Patient denies any complaints. Patient has AICD on the left chest,  Left foot in immobilizer. He wants to be discharged, frustrated with insurance authorization.  Objective: Vitals:   10/05/20 0530 10/05/20 0744 10/05/20 1130 10/05/20 1235  BP: (!) 102/58 (!) 93/48 (!) 104/56 (!) 102/56  Pulse: 84 80 77 74  Resp: 17 17    Temp: 98.4 F (36.9 C) 99 F (37.2 C)    TempSrc: Oral Oral    SpO2: 94% 93%    Weight:      Height:        Intake/Output Summary (Last 24 hours) at 10/05/2020 1430 Last data filed at 10/05/2020 1300 Gross per 24 hour  Intake 240 ml  Output 800 ml  Net -560 ml   Filed Weights   09/26/20 1241 10/02/20 0300  Weight: 78.8 kg 79.8 kg    Examination:  General exam: Appears calm and comfortable, Appears frustrated  Respiratory system: Clear to auscultation. Respiratory effort normal. Reduced breath sounds at bases. Chest : AICD on left chest. Cardiovascular system: S1 & S2 heard, RRR. No JVD, murmurs, rubs, gallops or  clicks. No pedal edema. Gastrointestinal system: Abdomen is nondistended, soft and nontender. No organomegaly or masses felt.  Normal bowel sounds heard. Central nervous system: Alert and oriented. No focal neurological deficits. Extremities: No edema, No cyanosis, no clubbing. Left foot in immobilizer. Skin: No rashes, lesions or ulcers Psychiatry: Judgement and insight appear normal. Mood & affect appropriate.     Data Reviewed: I have personally reviewed following labs and imaging studies  CBC: Recent Labs  Lab 09/29/20 0337 09/29/20 0337 09/30/20 0220 09/30/20 0220 10/01/20 0143 10/01/20 1103 10/02/20 0838 10/03/20 0358 10/03/20 0946 10/04/20 0517 10/05/20 0151  WBC 9.2  --  8.3  --  7.8  --   --   --   --   --   --   NEUTROABS 6.5  --  5.5  --   --   --   --   --   --   --   --   HGB 8.0*   < > 8.0*   < > 7.6*   < > 8.0* 7.7* 8.1* 7.9* 7.9*  HCT 23.8*   < > 24.1*   < > 22.6*   < > 23.8* 23.0* 24.5*  23.5* 23.6*  MCV 94.1  --  97.2  --  94.6  --   --   --   --   --   --   PLT 229  --  235  --  224  --   --   --   --   --   --    < > = values in this interval not displayed.   Basic Metabolic Panel: Recent Labs  Lab 10/01/20 0143 10/02/20 0128 10/03/20 0358 10/04/20 0517 10/05/20 0151  NA 134* 136 135 138 136  K 3.4* 3.7 3.9 3.9 3.8  CL 99 100 100 101 100  CO2 25 28 26 28 27   GLUCOSE 166* 108* 86 94 160*  BUN 32* 27* 29* 30* 32*  CREATININE 1.76* 1.69* 1.65* 1.74* 1.74*  CALCIUM 8.3* 8.4* 8.5* 8.6* 8.6*  MG  --  1.6* 2.2  --   --   PHOS  --  2.8 2.9  --   --    GFR: Estimated Creatinine Clearance: 32 mL/min (A) (by C-G formula based on SCr of 1.74 mg/dL (H)). Liver Function Tests: Recent Labs  Lab 09/29/20 0337 09/30/20 0220 10/01/20 0143 10/02/20 0128 10/04/20 0517  AST 129* 112* 98* 89* 85*  ALT 49* 42 40 38 36  ALKPHOS 134* 149* 141* 134* 133*  BILITOT 2.9* 2.4* 1.8* 1.6* 1.6*  PROT 6.5 6.5 6.2* 6.7 6.9  ALBUMIN 1.6* 1.6* 1.5* 1.6* 1.6*    Recent Labs  Lab 09/29/20 0337 10/04/20 0517  LIPASE 92* 65*   No results for input(s): AMMONIA in the last 168 hours. Coagulation Profile: No results for input(s): INR, PROTIME in the last 168 hours. Cardiac Enzymes: No results for input(s): CKTOTAL, CKMB, CKMBINDEX, TROPONINI in the last 168 hours. BNP (last 3 results) No results for input(s): PROBNP in the last 8760 hours. HbA1C: No results for input(s): HGBA1C in the last 72 hours. CBG: Recent Labs  Lab 10/04/20 1212 10/04/20 1625 10/04/20 1916 10/05/20 0630 10/05/20 1215  GLUCAP 104* 125* 193* 104* 109*   Lipid Profile: No results for input(s): CHOL, HDL, LDLCALC, TRIG, CHOLHDL, LDLDIRECT in the last 72 hours. Thyroid Function Tests: No results for input(s): TSH, T4TOTAL, FREET4, T3FREE, THYROIDAB in the last 72 hours. Anemia Panel: No results for input(s): VITAMINB12, FOLATE, FERRITIN, TIBC, IRON, RETICCTPCT in the last 72 hours. Sepsis Labs: No results for input(s): PROCALCITON, LATICACIDVEN in the last 168 hours.  Recent Results (from the past 240 hour(s))  Respiratory Panel by RT PCR (Flu A&B, Covid) - Nasopharyngeal Swab     Status: None   Collection Time: 09/26/20  6:31 AM   Specimen: Nasopharyngeal Swab  Result Value Ref Range Status   SARS Coronavirus 2 by RT PCR NEGATIVE NEGATIVE Final    Comment: (NOTE) SARS-CoV-2 target nucleic acids are NOT DETECTED.  The SARS-CoV-2 RNA is generally detectable in upper respiratoy specimens during the acute phase of infection. The lowest concentration of SARS-CoV-2 viral copies this assay can detect is 131 copies/mL. A negative result does not preclude SARS-Cov-2 infection and should not be used as the sole basis for treatment or other patient management decisions. A negative result may occur with  improper specimen collection/handling, submission of specimen other than nasopharyngeal swab, presence of viral mutation(s) within the areas targeted by this assay,  and inadequate number of viral copies (<131 copies/mL). A negative result must be combined with clinical observations, patient history, and epidemiological information. The expected result is Negative.  Fact Sheet  for Patients:  https://www.moore.com/  Fact Sheet for Healthcare Providers:  https://www.young.biz/  This test is no t yet approved or cleared by the [place] FDA and  has been authorized for detection and/or diagnosis of SARS-CoV-2 by FDA under an Emergency Use Authorization (EUA). This EUA will remain  in effect (meaning this test can be used) for the duration of the COVID-19 declaration under Section 564(b)(1) of the Act, 21 U.S.C. section 360bbb-3(b)(1), unless the authorization is terminated or revoked sooner.     Influenza A by PCR NEGATIVE NEGATIVE Final   Influenza B by PCR NEGATIVE NEGATIVE Final    Comment: (NOTE) The Xpert Xpress SARS-CoV-2/FLU/RSV assay is intended as an aid in  the diagnosis of influenza from Nasopharyngeal swab specimens and  should not be used as a sole basis for treatment. Nasal washings and  aspirates are unacceptable for Xpert Xpress SARS-CoV-2/FLU/RSV  testing.  Fact Sheet for Patients: https://www.moore.com/  Fact Sheet for Healthcare Providers: https://www.young.biz/  This test is not yet approved or cleared by the [place] FDA and  has been authorized for detection and/or diagnosis of SARS-CoV-2 by  FDA under an Emergency Use Authorization (EUA). This EUA will remain  in effect (meaning this test can be used) for the duration of the  Covid-19 declaration under Section 564(b)(1) of the Act, 21  U.S.C. section 360bbb-3(b)(1), unless the authorization is  terminated or revoked. Performed at [facility], [address], [place], [place] [address]   Culture, Urine     Status: None   Collection Time: 09/26/20 11:26 AM   Specimen: Urine,  Random  Result Value Ref Range Status   Specimen Description URINE, RANDOM  Final   Special Requests NONE  Final   Culture   Final    NO GROWTH Performed at [facility], [address] [place], [place] [address]    Report Status 09/27/2020 FINAL  Final     Radiology Studies: No results found.   Scheduled Meds: . amiodarone  200 mg Oral Daily  . apixaban  2.5 mg Oral BID  . feeding supplement  237 mL Oral TID BM  . finasteride  5 mg Oral BID  . iron polysaccharides  150 mg Oral Daily  . levothyroxine  125 mcg Oral QAC breakfast  . lidocaine  1 patch Transdermal Q24H  . multivitamin with minerals  1 tablet Oral Daily  . saccharomyces boulardii  250 mg Oral BID  . sertraline  25 mg Oral Daily  . spironolactone  12.5 mg Oral QHS  . torsemide  60 mg Oral Daily  . cyanocobalamin  1,000 mcg Oral Daily   Continuous Infusions:    LOS: 9 days    Time spent:  25 mins.    Cipriano Bunker, MD [facility]   If 7PM-7AM, please contact night-coverage

## 2020-10-06 DIAGNOSIS — K8591 Acute pancreatitis with uninfected necrosis, unspecified: Secondary | ICD-10-CM | POA: Diagnosis not present

## 2020-10-06 LAB — GLUCOSE, CAPILLARY
Glucose-Capillary: 95 mg/dL (ref 70–99)
Glucose-Capillary: 100 mg/dL — ABNORMAL HIGH (ref 70–99)
Glucose-Capillary: 117 mg/dL — ABNORMAL HIGH (ref 70–99)
Glucose-Capillary: 146 mg/dL — ABNORMAL HIGH (ref 70–99)

## 2020-10-06 LAB — COMPREHENSIVE METABOLIC PANEL
ALT: 33 U/L (ref 0–44)
AST: 84 U/L — ABNORMAL HIGH (ref 15–41)
Albumin: 1.6 g/dL — ABNORMAL LOW (ref 3.5–5.0)
Alkaline Phosphatase: 128 U/L — ABNORMAL HIGH (ref 38–126)
Anion gap: 9 (ref 5–15)
BUN: 35 mg/dL — ABNORMAL HIGH (ref 8–23)
CO2: 27 mmol/L (ref 22–32)
Calcium: 8.7 mg/dL — ABNORMAL LOW (ref 8.9–10.3)
Chloride: 103 mmol/L (ref 98–111)
Creatinine, Ser: 1.91 mg/dL — ABNORMAL HIGH (ref 0.61–1.24)
GFR, Estimated: 34 mL/min — ABNORMAL LOW (ref >60)
Glucose, Bld: 175 mg/dL — ABNORMAL HIGH (ref 70–99)
Potassium: 3.8 mmol/L (ref 3.5–5.1)
Sodium: 139 mmol/L (ref 135–145)
Total Bilirubin: 1.2 mg/dL (ref 0.3–1.2)
Total Protein: 6.9 g/dL (ref 6.5–8.1)

## 2020-10-06 LAB — HEMOGLOBIN AND HEMATOCRIT, BLOOD
HCT: 24.5 % — ABNORMAL LOW (ref 39.0–52.0)
Hemoglobin: 8 g/dL — ABNORMAL LOW (ref 13.0–17.0)

## 2020-10-06 MED ORDER — TORSEMIDE 20 MG PO TABS
40.0000 mg | ORAL_TABLET | Freq: Every day | ORAL | Status: DC
  Administered 2020-10-07 – 2020-10-08 (×2): 40 mg via ORAL
  Filled 2020-10-06 (×3): qty 2

## 2020-10-06 NOTE — Plan of Care (Signed)
Patient is alert, disoriented to time. Has a poor appetite, had his ensure at bedtime. Tolerates pills whole. Denies any pain. Fall precautions in place. Problem: Education: Goal: Knowledge of General Education information will improve Description: Including pain rating scale, medication(s)/side effects and non-pharmacologic comfort measures Outcome: Progressing   Problem: Health Behavior/Discharge Planning: Goal: Ability to manage health-related needs will improve Outcome: Progressing   Problem: Clinical Measurements: Goal: Ability to maintain clinical measurements within normal limits will improve Outcome: Progressing Goal: Will remain free from infection Outcome: Progressing Goal: Diagnostic test results will improve Outcome: Progressing Goal: Respiratory complications will improve Outcome: Progressing Goal: Cardiovascular complication will be avoided Outcome: Progressing   Problem: Activity: Goal: Risk for activity intolerance will decrease Outcome: Progressing   Problem: Nutrition: Goal: Adequate nutrition will be maintained Outcome: Progressing   Problem: Coping: Goal: Level of anxiety will decrease Outcome: Progressing   Problem: Elimination: Goal: Will not experience complications related to bowel motility Outcome: Progressing Goal: Will not experience complications related to urinary retention Outcome: Progressing   Problem: Pain Managment: Goal: General experience of comfort will improve Outcome: Progressing   Problem: Safety: Goal: Ability to remain free from injury will improve Outcome: Progressing   Problem: Skin Integrity: Goal: Risk for impaired skin integrity will decrease Outcome: Progressing

## 2020-10-06 NOTE — Progress Notes (Signed)
PROGRESS NOTE    Ryan Macdonald  JSH:[id number] DOB: 27-Sep-1934 DOA: 09/26/2020 PCP: [facility]    Brief Narrative: This 86 year old male with PMH of chronic systolic CHF (LVEF 20-25%, AICD in place), paroxysmal A. fib on amiodarone and Eliquis, hypertension, stage IIIb chronic kidney disease, hyperlipidemia, gout, hypothyroidism, recent sepsis related to UTI, discharged home on 10/28 following admission for presumed UTI, presented with abdominal pain.  In the ED, mildly elevated AST/138 and lipase 116, CT abdomen showed right greater than left pleural effusion, periportal edema, renal stones, gastric varices, right hernia and bladder distention.  Admitted for presumed pancreatitis-felt less likely.  Course complicated by AKI. Labs improved, Abd. Pain resolved.  PT recommended SNF, awaiting insurance authorization.   Assessment & Plan:   Principal Problem:   Pancreatitis Active Problems:   Nonischemic cardiomyopathy (HCC)   Essential hypertension   Hyperlipidemia   PAF (paroxysmal atrial fibrillation) (HCC)   Hyperkalemia   Chronic renal insufficiency, stage 3 (moderate) (HCC)   Gout   Diabetes mellitus type 2 in obese (HCC)   Transaminitis   Anemia of chronic disease   Protein-calorie malnutrition, severe (HCC)   Abdominal pain   Open wound of left heel   Abdominal pain, unclear etiology: >> Resolved. He presented with abdominal pain, initial lipase 116, AST 138.  Abdominal CT nonrevealing for cause.  Abdominal ultrasound showed possible thickening of the gallbladder.  General surgery consulted and clinically not cholecystitis.  HIDA scan negative. General surgery signed off.  Lipase getting better.  Initiated diet,  Etiology of his abnormal LFTs is unclear.  LFTs slowly improving.  AST and ALT are slightly down, total bilirubin down to 1.6 from peak of 3.2.  Hepatitis profile negative. Not eating much, encouraged oral intake.  Chronic systolic  CHF/nonischemic cardiomyopathy LVEF 20-25% by last echo in September 2021.  He has implantable AICD.  Clinically does not appear overtly volume overloaded.  Held torsemide and Aldactone due to AKI.  Continue gentle IV fluids with close monitoring due to AKI. Renal functions improving. Torsemide resumed.  Essential hypertension Soft blood pressures.  He has been previously on Imdur, hydralazine and metolazone but held since September 2021 admission likely related to hypotension.    Hyperlipidemia Continue Crestor.  Paroxysmal atrial fibrillation/AICD. Continue amiodarone and Eliquis.  Remains in sinus rhythm on telemetry.  Hyperkalemia Resolved.  Monitor BMP daily.  Acute kidney injury complicating stage III a chronic kidney disease Baseline creatinine not very clear.  His creatinine seems to be widely fluctuating.  He presented with creatinine in the 1.4 range which went up to 1.72 > 1.65?  Related to CT contrast.  Creatinine up to 1.91 today  .  Held diuretics on 11/1.  Continue gentle IV fluids.  Bladder scan without acute urinary retention.  Gout Allopurinol held.  No acute flare.  Type II DM with renal complications/hypoglycemia Spouse reports he was admitted to the [place] several months ago and was told that he no longer had diabetes.  He was having periodic episodes of hypoglycemia, likely related to poor oral intake.  SSI discontinued.  Changed IV fluids to D5 infusion.  Monitor CBGs closely and treat per hypoglycemia protocol.  No further hypoglycemic episodes since 11/1 afternoon.  Anemia of chronic disease:  Stable.  Continue iron and B12 supplementations. Hb remains at 7.9 - 8.0  Malnutrition ST evaluated him during recent hospital admission and recommended dysphagia 1 diet.  Requested repeat ST evaluation.  Open wound to heal WOCN RN consultation-see  detailed note from 09/27/2019.  Continue Prevalon boot.   Body mass index is 27.21 kg/m.  Nutritional  Status Nutrition Problem: Severe Malnutrition Etiology: chronic illness (CHF, recurrent UTI) Signs/Symptoms: severe muscle depletion, moderate fat depletion, energy intake < or equal to 75% for > or equal to 1 month, percent weight loss Percent weight loss: 13 % Interventions: MVI, Ensure Enlive (each supplement provides 350kcal and 20 grams of protein)   DVT prophylaxis: Eliquis Code Status: Full code. Family Communication:  No one at bed side. Disposition Plan:   Status is: Inpatient  Remains inpatient appropriate because:Inpatient level of care appropriate due to severity of illness   Dispo: The patient is from: Home              Anticipated d/c is to:  SNF  Awaiting insurance authorization.              Anticipated d/c date is: 2 days              Patient currently is not medically stable to d/c.   Consultants:   General surgery  Procedures: Antimicrobials: Anti-infectives (From admission, onward)   None     Subjective: Patient was seen and examined at bedside.  Overnight events noted.  Patient appears agitated, doesn't cooperate. Patient has AICD on the left chest,  Left foot in immobilizer. He appears frustrated with insurance authorization.  Objective: Vitals:   10/05/20 1235 10/05/20 1922 10/06/20 0328 10/06/20 0819  BP: (!) 102/56 (!) 96/53 (!) 107/57 (!) 98/55  Pulse: 74 76 80 80  Resp:  17 16 18   Temp:  99.1 F (37.3 C) 97.8 F (36.6 C) 98.1 F (36.7 C)  TempSrc:  Oral  Oral  SpO2:  97% 95% 95%  Weight:      Height:        Intake/Output Summary (Last 24 hours) at 10/06/2020 1607 Last data filed at 10/06/2020 1353 Gross per 24 hour  Intake 597 ml  Output 1200 ml  Net -603 ml   Filed Weights   09/26/20 1241 10/02/20 0300  Weight: 78.8 kg 79.8 kg    Examination:  General exam: Appears calm and comfortable, Appears irritated Respiratory system: Clear to auscultation. Respiratory effort normal. Reduced breath sounds at bases. Chest : AICD on  left chest. Cardiovascular system: S1 & S2 heard, RRR. No JVD, murmurs, rubs, gallops or clicks. No pedal edema. Gastrointestinal system: Abdomen is nondistended, soft and nontender. No organomegaly or masses felt.  Normal bowel sounds heard. Central nervous system: Alert and oriented. No focal neurological deficits. Extremities: No edema, No cyanosis, no clubbing. Left foot in immobilizer. Skin: No rashes, lesions or ulcers Psychiatry: Judgement and insight appear normal. Mood & affect appropriate.     Data Reviewed: I have personally reviewed following labs and imaging studies  CBC: Recent Labs  Lab 09/30/20 0220 09/30/20 0220 10/01/20 0143 10/01/20 1103 10/03/20 0358 10/03/20 0946 10/04/20 0517 10/05/20 0151 10/06/20 0251  WBC 8.3  --  7.8  --   --   --   --   --   --   NEUTROABS 5.5  --   --   --   --   --   --   --   --   HGB 8.0*   < > 7.6*   < > 7.7* 8.1* 7.9* 7.9* 8.0*  HCT 24.1*   < > 22.6*   < > 23.0* 24.5* 23.5* 23.6* 24.5*  MCV 97.2  --  94.6  --   --   --   --   --   --  PLT 235  --  224  --   --   --   --   --   --    < > = values in this interval not displayed.   Basic Metabolic Panel: Recent Labs  Lab 10/02/20 0128 10/03/20 0358 10/04/20 0517 10/05/20 0151 10/06/20 0251  NA 136 135 138 136 139  K 3.7 3.9 3.9 3.8 3.8  CL 100 100 101 100 103  CO2 28 26 28 27 27   GLUCOSE 108* 86 94 160* 175*  BUN 27* 29* 30* 32* 35*  CREATININE 1.69* 1.65* 1.74* 1.74* 1.91*  CALCIUM 8.4* 8.5* 8.6* 8.6* 8.7*  MG 1.6* 2.2  --   --   --   PHOS 2.8 2.9  --   --   --    GFR: Estimated Creatinine Clearance: 29.2 mL/min (A) (by C-G formula based on SCr of 1.91 mg/dL (H)). Liver Function Tests: Recent Labs  Lab 09/30/20 0220 10/01/20 0143 10/02/20 0128 10/04/20 0517 10/06/20 0251  AST 112* 98* 89* 85* 84*  ALT 42 40 38 36 33  ALKPHOS 149* 141* 134* 133* 128*  BILITOT 2.4* 1.8* 1.6* 1.6* 1.2  PROT 6.5 6.2* 6.7 6.9 6.9  ALBUMIN 1.6* 1.5* 1.6* 1.6* 1.6*   Recent  Labs  Lab 10/04/20 0517  LIPASE 65*   No results for input(s): AMMONIA in the last 168 hours. Coagulation Profile: No results for input(s): INR, PROTIME in the last 168 hours. Cardiac Enzymes: No results for input(s): CKTOTAL, CKMB, CKMBINDEX, TROPONINI in the last 168 hours. BNP (last 3 results) No results for input(s): PROBNP in the last 8760 hours. HbA1C: No results for input(s): HGBA1C in the last 72 hours. CBG: Recent Labs  Lab 10/05/20 1215 10/05/20 1638 10/05/20 1923 10/06/20 0643 10/06/20 1151  GLUCAP 109* 102* 95 95 100*   Lipid Profile: No results for input(s): CHOL, HDL, LDLCALC, TRIG, CHOLHDL, LDLDIRECT in the last 72 hours. Thyroid Function Tests: No results for input(s): TSH, T4TOTAL, FREET4, T3FREE, THYROIDAB in the last 72 hours. Anemia Panel: No results for input(s): VITAMINB12, FOLATE, FERRITIN, TIBC, IRON, RETICCTPCT in the last 72 hours. Sepsis Labs: No results for input(s): PROCALCITON, LATICACIDVEN in the last 168 hours.  No results found for this or any previous visit (from the past 240 hour(s)).   Radiology Studies: No results found.   Scheduled Meds: . amiodarone  200 mg Oral Daily  . apixaban  2.5 mg Oral BID  . feeding supplement  237 mL Oral TID BM  . finasteride  5 mg Oral BID  . iron polysaccharides  150 mg Oral Daily  . levothyroxine  125 mcg Oral QAC breakfast  . lidocaine  1 patch Transdermal Q24H  . multivitamin with minerals  1 tablet Oral Daily  . saccharomyces boulardii  250 mg Oral BID  . sertraline  25 mg Oral Daily  . spironolactone  12.5 mg Oral QHS  . torsemide  40 mg Oral Daily  . cyanocobalamin  1,000 mcg Oral Daily   Continuous Infusions:    LOS: 10 days    Time spent:  25 mins.    13/08/21, MD [facility]   If 7PM-7AM, please contact night-coverage

## 2020-10-06 NOTE — Plan of Care (Signed)

## 2020-10-06 NOTE — TOC Progression Note (Addendum)
Transition of Care [facility]) - Progression Note    Patient Details  Name: Ryan Macdonald MRN: [id number] Date of Birth: [birth year]  Transition of Care [facility]) CM/SW Contact  Ryan Lesches, RN Phone Number: 10/06/2020, 2:22 PM  Clinical Narrative:    NCM received call from [facility] SNF admission liaison , Ryan Macdonald. Ryan Macdonald explained to NCM pt with limited VA benefits, 40% service connected. Pt needs @ least 70% service connection in order to receive SNF benefit. Ryan Macdonald explained pt can used his Medicare part A for SNF placement.  Information explained to wife. Wife stated pt recently d/c to [facility] and is now owning $ 1900.00. Pt can't return unless $ 1900.00 is paid prior. Wife states can't afford. Wife give permission for [facility] admission to use Medicare for admit. NCM made [facility] aware. [facility] admission is now reviewing for potential admission under pt's Medicare    Ryan Macdonald (Spouse)      [phone]      [facility] team will continue to monitor.Marland Kitchen...  10/07/2020 1400 NCM called and spoke with [facility] admission liaison, Ryan Macdonald , regarding pt's admission to [facility]. 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 hoping pt can potentially qualify before extending acceptance to center.  10/07/2020 1637 NCM received call from [facility] admission. SNF bed will be available for pt tomorrow after 2pm. Wife is aware.  Expected Discharge Plan: Skilled Nursing Facility Barriers to Discharge: No SNF bed  Expected Discharge Plan and Services Expected Discharge Plan: Skilled Nursing Facility       Living arrangements for the past 2 months: Single Family Home                                       Social Determinants of Health (SDOH) Interventions    Readmission Risk Interventions Readmission Risk Prevention Plan 09/25/2020  Transportation Screening Complete  HRI or Home Care Consult  Complete  Social Work Consult for Recovery Care Planning/Counseling Complete  Palliative Care Screening Not Applicable  Medication Review Oceanographer) Complete  Some recent data might be hidden

## 2020-10-06 NOTE — Progress Notes (Signed)
PT Cancellation Note  Patient Details Name: Ryan Macdonald MRN: [id number] DOB: [birth year]   Cancelled Treatment:    Reason Eval/Treat Not Completed: Patient declined, no reason specified. States he is weak, tried to encourage him, but he continued to decline becoming slightly agitated. Will re-attempt later or tomorrow as time allows.     Sahar Ryback 10/06/2020, 1:50 PM

## 2020-10-07 DIAGNOSIS — K8591 Acute pancreatitis with uninfected necrosis, unspecified: Secondary | ICD-10-CM | POA: Diagnosis not present

## 2020-10-07 LAB — GLUCOSE, CAPILLARY
Glucose-Capillary: 97 mg/dL (ref 70–99)
Glucose-Capillary: 117 mg/dL — ABNORMAL HIGH (ref 70–99)
Glucose-Capillary: 110 mg/dL — ABNORMAL HIGH (ref 70–99)
Glucose-Capillary: 160 mg/dL — ABNORMAL HIGH (ref 70–99)

## 2020-10-07 LAB — HEMOGLOBIN AND HEMATOCRIT, BLOOD
HCT: 23.9 % — ABNORMAL LOW (ref 39.0–52.0)
Hemoglobin: 7.8 g/dL — ABNORMAL LOW (ref 13.0–17.0)

## 2020-10-07 LAB — BASIC METABOLIC PANEL
Anion gap: 11 (ref 5–15)
BUN: 34 mg/dL — ABNORMAL HIGH (ref 8–23)
CO2: 26 mmol/L (ref 22–32)
Calcium: 8.8 mg/dL — ABNORMAL LOW (ref 8.9–10.3)
Chloride: 99 mmol/L (ref 98–111)
Creatinine, Ser: 1.83 mg/dL — ABNORMAL HIGH (ref 0.61–1.24)
GFR, Estimated: 36 mL/min — ABNORMAL LOW (ref >60)
Glucose, Bld: 88 mg/dL (ref 70–99)
Potassium: 3.8 mmol/L (ref 3.5–5.1)
Sodium: 136 mmol/L (ref 135–145)

## 2020-10-07 LAB — MAGNESIUM: Magnesium: 1.8 mg/dL (ref 1.7–2.4)

## 2020-10-07 LAB — PHOSPHORUS: Phosphorus: 2.6 mg/dL (ref 2.5–4.6)

## 2020-10-07 NOTE — Plan of Care (Signed)
  Problem: Health Behavior/Discharge Planning: Goal: Ability to manage health-related needs will improve Outcome: Progressing   Problem: Clinical Measurements: Goal: Ability to maintain clinical measurements within normal limits will improve Outcome: Progressing Goal: Will remain free from infection Outcome: Progressing Goal: Diagnostic test results will improve Outcome: Progressing Goal: Respiratory complications will improve Outcome: Progressing Goal: Cardiovascular complication will be avoided Outcome: Progressing   Problem: Health Behavior/Discharge Planning: Goal: Ability to manage health-related needs will improve Outcome: Progressing

## 2020-10-07 NOTE — Progress Notes (Signed)
Nutrition Follow-up  DOCUMENTATION CODES:   Severe malnutrition in context of chronic illness  INTERVENTION:   -Continue Ensure Enlive po TID, each supplement provides 350 kcal and 20 grams of protein -Continue MVI with minerals daily -Magic cup TID with meals, each supplement provides 290 kcal and 9 grams of protein  NUTRITION DIAGNOSIS:   Severe Malnutrition related to chronic illness (CHF, recurrent UTI) as evidenced by severe muscle depletion, moderate fat depletion, energy intake < or equal to 75% for > or equal to 1 month, percent weight loss.  Ongoing  GOAL:   Patient will meet greater than or equal to 90% of their needs  Progressing   MONITOR:   PO intake, Supplement acceptance, Skin  REASON FOR ASSESSMENT:   Malnutrition Screening Tool    ASSESSMENT:   Pt with PMH of CHF with EF 20-25%, AICD in place, Afib, DM, HTN, stage III CKD, HLD who was recently admitted for sepsis secondary to UTI readmitted the next day from home with possible abdominal pain.  11/3- s/p BSE- advanced to dysphagia 3 diet with thin liquids  Reviewed I/O's: -260 ml x 24 hours and -4.8 L since admission  Pt very distractable at time of visit. He reports his appetite is unchanged, but is trying to eat what he can. He complains that "it is too much food". Noted lunch tray at bedside, which was untouched. Documented meal completions 10-25%. Pt still enjoys Ensure supplements and is drinking them. Noted pt consumed about half of supplement on tray table.   Discussed importance of good meal and supplement intake to promote healing. Encouraged pt to continue to drink Ensure supplements.   Per chart review, pt awaiting SNF placement for discharge.   Medications reviewed and include vitamin B-12, demeadex, and florastor.   Labs reviewed: CBGS: 97-146 (inpatient orders for glycemic control are none).   Diet Order:   Diet Order            DIET DYS 3 Room service appropriate? Yes with Assist;  Fluid consistency: Thin  Diet effective now                 EDUCATION NEEDS:   Not appropriate for education at this time  Skin:  Skin Assessment: Skin Integrity Issues: Other: non-pressure wound to lt heel and MASD coccyx  Last BM:  10/06/20  Height:   Ht Readings from Last 1 Encounters:  10/02/20 5\' 7"  (1.702 m)    Weight:   Wt Readings from Last 1 Encounters:  10/02/20 79.8 kg    Ideal Body Weight:  67.3 kg  BMI:  Body mass index is 27.55 kg/m.  Estimated Nutritional Needs:   Kcal:  2100-2400  Protein:  100-120 grams  Fluid:  2 L/day    13/04/21, RD, LDN, CDCES Registered Dietitian II Certified Diabetes Care and Education Specialist Please refer to [facility] for RD and/or RD on-call/weekend/after hours pager

## 2020-10-07 NOTE — Plan of Care (Signed)

## 2020-10-07 NOTE — Progress Notes (Signed)
Physical Therapy Treatment Patient Details Name: Ryan Macdonald MRN: [id number] DOB: September 26, 1934 Today's Date: 10/07/2020    History of Present Illness Pt is an 86 y/o male admitted secondary to pancreatitis. Pt with recent admissions for UTI. PMH includes HTN, CHF, s/p AICD, a fib, CKD, gout, DM.     PT Comments    Pt supine on arrival, agreeable to therapy session with max encouragement and with limited participation and fair tolerance for session. Pt mildly agitated due to perseveration on discharge planning (spouse in room on phone during session). Pt performed rolling with modA, dependent for hygiene assist after rolling (NT assisting with perineal care). Pt performed posterior supine scoot with +62maxA but refused transfer to EOB/OOB despite encouragement. Pt performed supine/seated BLE therapeutic exercises with fair tolerance, of note pt with limited hip abduction ROM and would benefit from hip stretches next session if agreeable. Pt given HEP handout for BLE strengthening with instruction included for pt/family on pressure offloading and skin care, bed in chair position at end of session for benefit of pulmonary hygiene and pressure redistribution. Will plan to assess seated balance and progress transfers next session if pt participatory. Pt continues to benefit from PT services to progress toward functional mobility goals. D/C recs below remain appropriate at this time.  Follow Up Recommendations  SNF;Supervision/Assistance - 24 hour     Equipment Recommendations  Other (comment) (defer to next venue)    Recommendations for Other Services       Precautions / Restrictions Precautions Precautions: Fall;Other (comment);ICD/Pacemaker Precaution Comments: L heel wound Restrictions Weight Bearing Restrictions: No Other Position/Activity Restrictions: ? WB restrictions L LE given extent of heel ulcer    Mobility  Bed Mobility Overal bed mobility: Needs Assistance Bed Mobility:  Rolling (posterior supine scoot) Rolling: Mod assist         General bed mobility comments: rolling for removal of bed pain with tactile cues for cross-body reaching to opposite bed rail and needs modA for hip rotation; pt +60maxA for posterior supine scooting with RLE assist and HOB flat  Transfers                 General transfer comment: refused OOB or EOB today  Ambulation/Gait                 Stairs             Wheelchair Mobility    Modified Rankin (Stroke Patients Only)       Balance                                            Cognition Arousal/Alertness: Awake/alert Behavior During Therapy: Agitated;Flat affect;Anxious Overall Cognitive Status: History of cognitive impairments - at baseline Area of Impairment: Orientation;Memory;Following commands;Awareness                 Orientation Level: Person;Situation (to self/situation, not otherwise assessed) Current Attention Level: Selective Memory: Decreased short-term memory Following Commands: Follows one step commands with increased time Safety/Judgement: Decreased awareness of safety;Decreased awareness of deficits   Problem Solving: Slow processing;Difficulty sequencing;Requires verbal cues;Requires tactile cues General Comments: pt w/increased agitation, reporting he has issues with dual tasking but ignoring many 1-step cues. Minimal participation aside from bed mobility and some BLE exercises.      Exercises General Exercises - Lower Extremity Ankle Circles/Pumps: AROM;Both;10 reps;Seated (bed in chair position)  Short Arc Quad: AROM;Both;10 reps;Seated (bed in chair position) Heel Slides: AAROM;Both;10 reps;Supine Hip ABduction/ADduction: AAROM;Both;10 reps;Supine (HOB elevated) Hip Flexion/Marching: AAROM;Both;10 reps;Seated (bed in chair position)    General Comments General comments (skin integrity, edema, etc.): pt refusing EOB/OOB but does still have  pressure offloading boot on LLE and bed placed in chair position, pt given HEP handout with education on pressure offloading but refusing verbal education at end of session 2/2 wanting to speak with wife      Pertinent Vitals/Pain Pain Assessment: 0-10 Pain Score: 0-No pain Faces Pain Scale: No hurt Pain Location: when HOB elevated >60 deg, pt c/o LB discomfort but when asked for pain score, reports "no pain" Pain Intervention(s): Monitored during session;Repositioned   Vitals:   10/07/20 0744 10/07/20 1247  BP: (!) 107/55 108/62  Pulse: 67 70  Resp: 16 17  Temp: 98.8 F (37.1 C) 98.5 F (36.9 C)  SpO2: 96% 97%    Home Living                      Prior Function            PT Goals (current goals can now be found in the care plan section) Acute Rehab PT Goals Patient Stated Goal: motivated to walk more PT Goal Formulation: Patient unable to participate in goal setting Time For Goal Achievement: 10/10/20 Potential to Achieve Goals: Fair Progress towards PT goals: Progressing toward goals (slow progress)    Frequency    Min 2X/week      PT Plan Current plan remains appropriate    Co-evaluation              AM-PAC PT "6 Clicks" Mobility   Outcome Measure  Help needed turning from your back to your side while in a flat bed without using bedrails?: A Lot Help needed moving from lying on your back to sitting on the side of a flat bed without using bedrails?: A Lot Help needed moving to and from a bed to a chair (including a wheelchair)?: Total Help needed standing up from a chair using your arms (e.g., wheelchair or bedside chair)?: Total Help needed to walk in hospital room?: Total Help needed climbing 3-5 steps with a railing? : Total 6 Click Score: 8    End of Session Equipment Utilized During Treatment: Other (comment) (bed transfer pads) Activity Tolerance: Treatment limited secondary to agitation Patient left: in bed;with bed alarm set;with  family/visitor present;with call bell/phone within reach (bed in chair position and spouse present in room with him) Nurse Communication: Mobility status;Need for lift equipment PT Visit Diagnosis: Muscle weakness (generalized) (M62.81);Other abnormalities of gait and mobility (R26.89);Difficulty in walking, not elsewhere classified (R26.2);Unsteadiness on feet (R26.81)     Time: 6237-6283 PT Time Calculation (min) (ACUTE ONLY): 20 min  Charges:  $Therapeutic Exercise: 8-22 mins                     Jevaeh Shams P., PTA Acute Rehabilitation Services Pager: [phone] Office: [phone]   [name] Zorah Backes 10/07/2020, 3:04 PM

## 2020-10-07 NOTE — Progress Notes (Signed)
PROGRESS NOTE    Ryan Macdonald  ZWC:[id number] DOB: [birth year] DOA: 09/26/2020 PCP: [facility]    Brief Narrative: This 86 year old male with PMH of chronic systolic CHF (LVEF 20-25%, AICD in place), paroxysmal A. fib on amiodarone and Eliquis, hypertension, stage IIIb chronic kidney disease, hyperlipidemia, gout, hypothyroidism, recent sepsis related to UTI, discharged home on 10/28 following admission for presumed UTI, presented with abdominal pain.  In the ED, mildly elevated AST/138 and lipase 116, CT abdomen showed right greater than left pleural effusion, periportal edema, renal stones, gastric varices, right hernia and bladder distention.  Admitted for presumed pancreatitis-felt less likely.  Course complicated by AKI. Labs improved, Abd. Pain resolved.  PT recommended SNF, awaiting insurance authorization.   Assessment & Plan:   Principal Problem:   Pancreatitis Active Problems:   Nonischemic cardiomyopathy (HCC)   Essential hypertension   Hyperlipidemia   PAF (paroxysmal atrial fibrillation) (HCC)   Hyperkalemia   Chronic renal insufficiency, stage 3 (moderate) (HCC)   Gout   Diabetes mellitus type 2 in obese (HCC)   Transaminitis   Anemia of chronic disease   Protein-calorie malnutrition, severe (HCC)   Abdominal pain   Open wound of left heel   Abdominal pain, unclear etiology: >> Resolved. He presented with abdominal pain, initial lipase 116, AST 138.  Abdominal CT nonrevealing for cause.  Abdominal ultrasound showed possible thickening of the gallbladder.  General surgery consulted and clinically not cholecystitis.  HIDA scan negative. General surgery signed off.  Lipase getting better.  Initiated diet,  Etiology of his abnormal LFTs is unclear. LFTs slowly improving.  AST and ALT are slightly down, total bilirubin down to 1.6 from peak of 3.2.  Hepatitis profile negative. Not eating much, encouraged oral intake.  Chronic systolic CHF/nonischemic  cardiomyopathy LVEF 20-25% by last echo in September 2021.  He has implantable AICD.  Clinically does not appear overtly volume overloaded.  Held torsemide and Aldactone due to AKI.  Continue gentle IV fluids with close monitoring due to AKI. Renal functions improving. Torsemide resumed.  Essential hypertension Soft blood pressures.  He has been previously on Imdur, hydralazine and metolazone but held since September 2021 admission likely related to hypotension.    Hyperlipidemia Continue Crestor.  Paroxysmal atrial fibrillation/AICD. Continue amiodarone and Eliquis.  Remains in sinus rhythm on telemetry.  Hyperkalemia Resolved.  Monitor BMP daily.  Acute kidney injury complicating stage III a chronic kidney disease Baseline creatinine not very clear.  His creatinine seems to be widely fluctuating.  He presented with creatinine in the 1.4 range which went up to 1.72 > 1.65?  Related to CT contrast.  Creatinine up to 1.91 today  .  Held diuretics on 11/1. Continue gentle IV fluids.  Bladder scan without acute urinary retention.  Gout Allopurinol held.  No acute flare.  Type II DM with renal complications/hypoglycemia Spouse reports he was admitted to the [place] several months ago and was told that he no longer had diabetes.  He was having periodic episodes of hypoglycemia, likely related to poor oral intake.  SSI discontinued.  Changed IV fluids to D5 infusion.  Monitor CBGs closely and treat per hypoglycemia protocol.  No further hypoglycemic episodes since 11/1 afternoon.  Anemia of chronic disease:  Stable.  Continue iron and B12 supplementations. Hb remains at 7.9 - 8.0  Malnutrition ST evaluated him during recent hospital admission and recommended dysphagia 1 diet.  Requested repeat ST evaluation.  Open wound to heal WOCN RN consultation-see detailed note  from 09/27/2019.  Continue Prevalon boot.   Body mass index is 27.21 kg/m.  Nutritional Status Nutrition  Problem: Severe Malnutrition Etiology: chronic illness (CHF, recurrent UTI) Signs/Symptoms: severe muscle depletion, moderate fat depletion, energy intake < or equal to 75% for > or equal to 1 month, percent weight loss Percent weight loss: 13 % Interventions: MVI, Ensure Enlive (each supplement provides 350kcal and 20 grams of protein)   DVT prophylaxis: Eliquis Code Status: Full code. Family Communication:  No one at bed side. Disposition Plan:   Status is: Inpatient  Remains inpatient appropriate because:Inpatient level of care appropriate due to severity of illness   Dispo: The patient is from: Home              Anticipated d/c is to:  SNF  Awaiting insurance authorization.              Anticipated d/c date is: 2 days              Patient currently is not medically stable to d/c.   Consultants:   General surgery  Procedures: Antimicrobials: Anti-infectives (From admission, onward)   None     Subjective: Patient was seen and examined at bedside.  Overnight events noted.   Patient denies any complaints. Patient has AICD on the left chest,  Left foot in immobilizer.   Objective: Vitals:   10/06/20 2103 10/07/20 0600 10/07/20 0744 10/07/20 1247  BP: (!) 101/57 (!) 103/59 (!) 107/55 108/62  Pulse: 83 88 67 70  Resp: 17 17 16 17   Temp: 98.2 F (36.8 C) 97.8 F (36.6 C) 98.8 F (37.1 C) 98.5 F (36.9 C)  TempSrc: Oral Oral Oral Oral  SpO2: 97% 97% 96% 97%  Weight:      Height:        Intake/Output Summary (Last 24 hours) at 10/07/2020 1532 Last data filed at 10/07/2020 0800 Gross per 24 hour  Intake 360 ml  Output 500 ml  Net -140 ml   Filed Weights   09/26/20 1241 10/02/20 0300  Weight: 78.8 kg 79.8 kg    Examination:  General exam: Appears calm and comfortable, Appears irritated Respiratory system: Clear to auscultation. Respiratory effort normal. Reduced breath sounds at bases. Chest : AICD on left chest. Cardiovascular system: S1 & S2 heard, RRR.  No JVD, murmurs, rubs, gallops or clicks. No pedal edema. Gastrointestinal system: Abdomen is nondistended, soft and nontender. No organomegaly or masses felt.  Normal bowel sounds heard. Central nervous system: Alert and oriented. No focal neurological deficits. Extremities: No edema, No cyanosis, no clubbing. Left foot in immobilizer. Skin: No rashes, lesions or ulcers Psychiatry: Judgement and insight appear normal. Mood & affect appropriate.     Data Reviewed: I have personally reviewed following labs and imaging studies  CBC: Recent Labs  Lab 10/01/20 0143 10/01/20 1103 10/03/20 0946 10/04/20 0517 10/05/20 0151 10/06/20 0251 10/07/20 0038  WBC 7.8  --   --   --   --   --   --   HGB 7.6*   < > 8.1* 7.9* 7.9* 8.0* 7.8*  HCT 22.6*   < > 24.5* 23.5* 23.6* 24.5* 23.9*  MCV 94.6  --   --   --   --   --   --   PLT 224  --   --   --   --   --   --    < > = values in this interval not displayed.   Basic Metabolic Panel: Recent  Labs  Lab 10/02/20 0128 10/02/20 0128 10/03/20 0358 10/04/20 0517 10/05/20 0151 10/06/20 0251 10/07/20 0038  NA 136   < > 135 138 136 139 136  K 3.7   < > 3.9 3.9 3.8 3.8 3.8  CL 100   < > 100 101 100 103 99  CO2 28   < > 26 28 27 27 26   GLUCOSE 108*   < > 86 94 160* 175* 88  BUN 27*   < > 29* 30* 32* 35* 34*  CREATININE 1.69*   < > 1.65* 1.74* 1.74* 1.91* 1.83*  CALCIUM 8.4*   < > 8.5* 8.6* 8.6* 8.7* 8.8*  MG 1.6*  --  2.2  --   --   --  1.8  PHOS 2.8  --  2.9  --   --   --  2.6   < > = values in this interval not displayed.   GFR: Estimated Creatinine Clearance: 30.4 mL/min (A) (by C-G formula based on SCr of 1.83 mg/dL (H)). Liver Function Tests: Recent Labs  Lab 10/01/20 0143 10/02/20 0128 10/04/20 0517 10/06/20 0251  AST 98* 89* 85* 84*  ALT 40 38 36 33  ALKPHOS 141* 134* 133* 128*  BILITOT 1.8* 1.6* 1.6* 1.2  PROT 6.2* 6.7 6.9 6.9  ALBUMIN 1.5* 1.6* 1.6* 1.6*   Recent Labs  Lab 10/04/20 0517  LIPASE 65*   No results for  input(s): AMMONIA in the last 168 hours. Coagulation Profile: No results for input(s): INR, PROTIME in the last 168 hours. Cardiac Enzymes: No results for input(s): CKTOTAL, CKMB, CKMBINDEX, TROPONINI in the last 168 hours. BNP (last 3 results) No results for input(s): PROBNP in the last 8760 hours. HbA1C: No results for input(s): HGBA1C in the last 72 hours. CBG: Recent Labs  Lab 10/06/20 1151 10/06/20 1710 10/06/20 2102 10/07/20 0636 10/07/20 1139  GLUCAP 100* 117* 146* 97 117*   Lipid Profile: No results for input(s): CHOL, HDL, LDLCALC, TRIG, CHOLHDL, LDLDIRECT in the last 72 hours. Thyroid Function Tests: No results for input(s): TSH, T4TOTAL, FREET4, T3FREE, THYROIDAB in the last 72 hours. Anemia Panel: No results for input(s): VITAMINB12, FOLATE, FERRITIN, TIBC, IRON, RETICCTPCT in the last 72 hours. Sepsis Labs: No results for input(s): PROCALCITON, LATICACIDVEN in the last 168 hours.  No results found for this or any previous visit (from the past 240 hour(s)).   Radiology Studies: No results found.   Scheduled Meds: . amiodarone  200 mg Oral Daily  . apixaban  2.5 mg Oral BID  . feeding supplement  237 mL Oral TID BM  . finasteride  5 mg Oral BID  . iron polysaccharides  150 mg Oral Daily  . levothyroxine  125 mcg Oral QAC breakfast  . lidocaine  1 patch Transdermal Q24H  . multivitamin with minerals  1 tablet Oral Daily  . saccharomyces boulardii  250 mg Oral BID  . sertraline  25 mg Oral Daily  . spironolactone  12.5 mg Oral QHS  . torsemide  40 mg Oral Daily  . cyanocobalamin  1,000 mcg Oral Daily   Continuous Infusions:    LOS: 11 days    Time spent:  25 mins.    13/09/21, MD [facility]   If 7PM-7AM, please contact night-coverage

## 2020-10-08 DIAGNOSIS — N179 Acute kidney failure, unspecified: Secondary | ICD-10-CM | POA: Diagnosis not present

## 2020-10-08 DIAGNOSIS — N1831 Chronic kidney disease, stage 3a: Secondary | ICD-10-CM

## 2020-10-08 DIAGNOSIS — K8500 Idiopathic acute pancreatitis without necrosis or infection: Secondary | ICD-10-CM | POA: Diagnosis not present

## 2020-10-08 DIAGNOSIS — D638 Anemia in other chronic diseases classified elsewhere: Secondary | ICD-10-CM

## 2020-10-08 DIAGNOSIS — R1013 Epigastric pain: Secondary | ICD-10-CM

## 2020-10-08 LAB — GLUCOSE, CAPILLARY
Glucose-Capillary: 99 mg/dL (ref 70–99)
Glucose-Capillary: 104 mg/dL — ABNORMAL HIGH (ref 70–99)
Glucose-Capillary: 83 mg/dL (ref 70–99)
Glucose-Capillary: 84 mg/dL (ref 70–99)

## 2020-10-08 LAB — BASIC METABOLIC PANEL
Anion gap: 10 (ref 5–15)
BUN: 38 mg/dL — ABNORMAL HIGH (ref 8–23)
CO2: 26 mmol/L (ref 22–32)
Calcium: 8.7 mg/dL — ABNORMAL LOW (ref 8.9–10.3)
Chloride: 100 mmol/L (ref 98–111)
Creatinine, Ser: 1.9 mg/dL — ABNORMAL HIGH (ref 0.61–1.24)
GFR, Estimated: 34 mL/min — ABNORMAL LOW (ref >60)
Glucose, Bld: 102 mg/dL — ABNORMAL HIGH (ref 70–99)
Potassium: 4 mmol/L (ref 3.5–5.1)
Sodium: 136 mmol/L (ref 135–145)

## 2020-10-08 LAB — CBC
HCT: 23.6 % — ABNORMAL LOW (ref 39.0–52.0)
Hemoglobin: 7.8 g/dL — ABNORMAL LOW (ref 13.0–17.0)
MCH: 32.4 pg (ref 26.0–34.0)
MCHC: 33.1 g/dL (ref 30.0–36.0)
MCV: 97.9 fL (ref 80.0–100.0)
Platelets: 293 10*3/uL (ref 150–400)
RBC: 2.41 MIL/uL — ABNORMAL LOW (ref 4.22–5.81)
RDW: 21 % — ABNORMAL HIGH (ref 11.5–15.5)
WBC: 8.6 10*3/uL (ref 4.0–10.5)
nRBC: 0 % (ref 0.0–0.2)

## 2020-10-08 LAB — SARS CORONAVIRUS 2 BY RT PCR (HOSPITAL ORDER, PERFORMED IN ~~LOC~~ HOSPITAL LAB): SARS Coronavirus 2: NEGATIVE

## 2020-10-08 LAB — MAGNESIUM: Magnesium: 1.7 mg/dL (ref 1.7–2.4)

## 2020-10-08 LAB — PHOSPHORUS: Phosphorus: 2.9 mg/dL (ref 2.5–4.6)

## 2020-10-08 MED ORDER — INSULIN ASPART 100 UNIT/ML ~~LOC~~ SOLN: 0.0000 [IU] | SUBCUTANEOUS | Status: DC

## 2020-10-08 MED ORDER — DRONABINOL 2.5 MG PO CAPS
5.0000 mg | ORAL_CAPSULE | Freq: Two times a day (BID) | ORAL | Status: DC
  Administered 2020-10-09 – 2020-10-12 (×6): 5 mg via ORAL
  Filled 2020-10-08 (×6): qty 2

## 2020-10-08 MED ORDER — SODIUM CHLORIDE 0.9 % IV SOLN: INTRAVENOUS | Status: DC

## 2020-10-08 NOTE — Progress Notes (Signed)
OT Cancellation Note  Patient Details Name: Ryan Macdonald MRN: [id number] DOB: 12-31-1933   Cancelled Treatment:    Reason Eval/Treat Not Completed: Patient declined, no reason specified. Pt declined EOB or OOB activities today. Pt appears more confused, reporting concern over financial aspect of rehab and preference for figuring that out first. Attempted to educate pt that the more he moves around in hospital, the probable less time needed in postacute rehab. However, pt did not appear to understand.   Lorre Munroe 10/08/2020, 11:12 AM

## 2020-10-08 NOTE — Plan of Care (Signed)

## 2020-10-08 NOTE — Plan of Care (Signed)
  Problem: Education: Goal: Knowledge of General Education information will improve Description: Including pain rating scale, medication(s)/side effects and non-pharmacologic comfort measures Outcome: Progressing   Problem: Health Behavior/Discharge Planning: Goal: Ability to manage health-related needs will improve Outcome: Progressing   Problem: Activity: Goal: Risk for activity intolerance will decrease Outcome: Progressing   Problem: Coping: Goal: Level of anxiety will decrease Outcome: Progressing   Problem: Safety: Goal: Ability to remain free from injury will improve Outcome: Progressing   

## 2020-10-08 NOTE — TOC Transition Note (Addendum)
Transition of Care [facility]) - CM/SW Discharge Note   Patient Details  Name: Ryan Macdonald MRN: [id number] Date of Birth: [birth year]  Transition of Care [facility]) CM/SW Contact:  Epifanio Lesches, RN Phone Number: 10/08/2020, 10:19 AM   Clinical Narrative:     Patient will DC to: [facility] SNF Anticipated DC date: 10/08/2020 Family notified: yes Transport by: Sharin Mons   Per MD patient ready for DC today. RN, patient, patient's family, and facility notified of DC. Facility will receive pt after 2pm once SNF bed is available. COVID pending. Discharge Summary and FL2 will be sent to facility prior. RN to call report prior to discharge [phone]). DC packet on chart. Ambulance transport requested for patient, time pick for 2pm  RNCM will sign off for now as intervention is no longer needed. Please consult [place] again if new needs arise.  10/08/2020 1 :58 pm -  Per  MD pt will not d/c today 2/2 elevated creatinine.  TOC team will continue to monitor and follow....  Final next level of care: Skilled Nursing Facility Barriers to Discharge: No Barriers Identified   Patient Goals and CMS Choice Patient states their goals for this hospitalization and ongoing recovery are:: to get better CMS Medicare.gov Compare Post Acute Care list provided to:: Patient Choice offered to / list presented to : Patient  Discharge Placement                       Discharge Plan and Services                                     Social Determinants of Health (SDOH) Interventions     Readmission Risk Interventions Readmission Risk Prevention Plan 09/25/2020  Transportation Screening Complete  HRI or Home Care Consult Complete  Social Work Consult for Recovery Care Planning/Counseling Complete  Palliative Care Screening Not Applicable  Medication Review Oceanographer) Complete  Some recent data might be hidden

## 2020-10-08 NOTE — Progress Notes (Signed)
PROGRESS NOTE    Ryan Macdonald  NIO:[id number] DOB: 10-25-1934 DOA: 09/26/2020 PCP: [facility]     Brief Narrative:   86 year old BM PMHx Chronic Systolic CHF (LVEF 20-25%, AICD in place), paroxysmal A. fib on amiodarone and Eliquis, HTN, CKD stage IIIb HLD, Gout, Hypothyroidism, recent sepsis related to UTI, discharged home on 10/28 following admission for presumed UTI,   Presented with abdominal pain. In the ED, mildly elevated AST/138 and lipase 116, CT abdomen showed right greater than left pleural effusion, periportal edema, renal stones, gastric varices, right hernia and bladder distention. Admitted for presumed pancreatitis-felt less likely. Course complicated by AKI. Labs improved, Abd. Pain resolved.  PT recommended SNF, awaiting insurance authorization.   Subjective: Afebrile overnight A/O x4,   Assessment & Plan: Covid vaccination; vaccinated   Principal Problem:   Pancreatitis Active Problems:   Nonischemic cardiomyopathy (HCC)   Essential hypertension   Hyperlipidemia   PAF (paroxysmal atrial fibrillation) (HCC)   Hyperkalemia   Chronic renal insufficiency, stage 3 (moderate) (HCC)   Gout   Diabetes mellitus type 2 in obese (HCC)   Transaminitis   Anemia of chronic disease   Protein-calorie malnutrition, severe (HCC)   Abdominal pain   Open wound of left heel   Abdominal pain, unclear etiology: >> Resolved. He presented with abdominal pain, initial lipase 116, AST 138. Abdominal CT nonrevealing for cause. Abdominal ultrasound showed possible thickening of the gallbladder. General surgery consulted and clinically not cholecystitis. HIDA scan negative. General surgery signed off. Lipase getting better.  Initiated diet,  Etiology of his abnormal LFTs is unclear. LFTs slowly improving. AST and ALT are slightly down, total bilirubin down to 1.6 from peak of 3.2. Hepatitis profile negative.Not eating much, encouraged oral  intake. -11/10 no complaint of abdominal pain today  Chronic systolic CHF/nonischemic cardiomyopathy LVEF 20-25% by last echo in September 2021.  He has implantable AICD.  -Clinically appears dehydrated  -Strict in and out -4.4 L -Daily weight  Essential hypertension Soft blood pressures. He has been previously on Imdur, hydralazine and metolazone but held since September 2021 admission likely related to hypotension.   Hyperlipidemia Continue Crestor.  Paroxysmal atrial fibrillation/AICD. Continue amiodarone and Eliquis. Remains in sinus rhythm on telemetry.  Hyperkalemia Resolved. Monitor BMP daily.  Acute on CKD stage IIIa (BaselineCr 1.4) -Although patient's creatinine tends to fluctuate has continued to trend up now at 1.9 -11/10 Torsemide 40 mg daily (hold) -Gently hydrate believe patient is dehydrated.  Normal saline 106ml/hr  Gout Allopurinol held. No acute flare.  Type II DM with renal complications/hypoglycemia Spouse reports he was admitted to the [place] several months ago and was told that he no longer had diabetes. He was having periodic episodes of hypoglycemia, likely related to poor oral intake.  -11/10 very sensitive SSI   Anemia of chronic disease:  Stable. Continue iron and B12 supplementations. Hb remains at 7.9 - 8.0 Lab Results  Component Value Date   HGB 7.8 (L) 10/08/2020   HGB 7.8 (L) 10/07/2020   HGB 8.0 (L) 10/06/2020   HGB 7.9 (L) 10/05/2020   HGB 7.9 (L) 10/04/2020  -Transfuse for hemoglobin<7 -Stable  Malnutrition ST evaluated him during recent hospital admission and recommended dysphagia 1 diet.Requested repeat ST evaluation. -Marinol 5 mg BID  Open wound to heal WOCN RN consultation-see detailed note from 09/27/2019. Continue Prevalon boot.    DVT prophylaxis: Eliquis Code Status: Full Family Communication: 11/10 wife at bedside for discussion of plan of care answered all questions  Status is:  Inpatient    Dispo: The patient is from: Home              Anticipated d/c is to: Home              Anticipated d/c date is: 11/12              Patient currently unstable      Consultants:  CCS   Procedures/Significant Events:    I have personally reviewed and interpreted all radiology studies and my findings are as above.  VENTILATOR SETTINGS:    Cultures   Antimicrobials:    Devices    LINES / TUBES:      Continuous Infusions:   Objective: Vitals:   10/07/20 2002 10/07/20 2252 10/08/20 0328 10/08/20 0726  BP: (!) 98/52 (!) 101/54 (!) 105/58 (!) 102/51  Pulse: 75  79 70  Resp: 14  16 17   Temp: 97.9 F (36.6 C)  98 F (36.7 C) 98.2 F (36.8 C)  TempSrc: Oral  Oral Oral  SpO2: 96%  98% 97%  Weight:      Height:        Intake/Output Summary (Last 24 hours) at 10/08/2020 0827 Last data filed at 10/07/2020 1900 Gross per 24 hour  Intake 480 ml  Output --  Net 480 ml   Filed Weights   09/26/20 1241 10/02/20 0300  Weight: 78.8 kg 79.8 kg    Examination:  General: A/O x4, No acute respiratory distress Eyes: negative scleral hemorrhage, negative anisocoria, negative icterus ENT: Negative Runny nose, negative gingival bleeding, Neck:  Negative scars, masses, torticollis, lymphadenopathy, JVD Lungs: Clear to auscultation bilaterally without wheezes or crackles Cardiovascular: Regular rate and rhythm without murmur gallop or rub normal S1 and S2 Abdomen: negative abdominal pain, nondistended, positive soft, bowel sounds, no rebound, no ascites, no appreciable mass Extremities: No significant cyanosis, clubbing, or edema bilateral lower extremities Skin: Negative rashes, lesions, ulcers Psychiatric:  Negative depression, negative anxiety, negative fatigue, negative mania  Central nervous system:  Cranial nerves II through XII intact, tongue/uvula midline, all extremities muscle strength 5/5, sensation intact throughout, negative dysarthria,  negative expressive aphasia, negative receptive aphasia.  .     Data Reviewed: Care during the described time interval was provided by me .  I have reviewed this patient's available data, including medical history, events of note, physical examination, and all test results as part of my evaluation.  CBC: Recent Labs  Lab 10/04/20 0517 10/05/20 0151 10/06/20 0251 10/07/20 0038 10/08/20 0557  WBC  --   --   --   --  8.6  HGB 7.9* 7.9* 8.0* 7.8* 7.8*  HCT 23.5* 23.6* 24.5* 23.9* 23.6*  MCV  --   --   --   --  97.9  PLT  --   --   --   --  293   Basic Metabolic Panel: Recent Labs  Lab 10/02/20 0128 10/02/20 0128 10/03/20 0358 10/03/20 0358 10/04/20 0517 10/05/20 0151 10/06/20 0251 10/07/20 0038 10/08/20 0557  NA 136   < > 135   < > 138 136 139 136 136  K 3.7   < > 3.9   < > 3.9 3.8 3.8 3.8 4.0  CL 100   < > 100   < > 101 100 103 99 100  CO2 28   < > 26   < > 28 27 27 26 26   GLUCOSE 108*   < > 86   < >  94 160* 175* 88 102*  BUN 27*   < > 29*   < > 30* 32* 35* 34* 38*  CREATININE 1.69*   < > 1.65*   < > 1.74* 1.74* 1.91* 1.83* 1.90*  CALCIUM 8.4*   < > 8.5*   < > 8.6* 8.6* 8.7* 8.8* 8.7*  MG 1.6*  --  2.2  --   --   --   --  1.8 1.7  PHOS 2.8  --  2.9  --   --   --   --  2.6 2.9   < > = values in this interval not displayed.   GFR: Estimated Creatinine Clearance: 29.3 mL/min (A) (by C-G formula based on SCr of 1.9 mg/dL (H)). Liver Function Tests: Recent Labs  Lab 10/02/20 0128 10/04/20 0517 10/06/20 0251  AST 89* 85* 84*  ALT 38 36 33  ALKPHOS 134* 133* 128*  BILITOT 1.6* 1.6* 1.2  PROT 6.7 6.9 6.9  ALBUMIN 1.6* 1.6* 1.6*   Recent Labs  Lab 10/04/20 0517  LIPASE 65*   No results for input(s): AMMONIA in the last 168 hours. Coagulation Profile: No results for input(s): INR, PROTIME in the last 168 hours. Cardiac Enzymes: No results for input(s): CKTOTAL, CKMB, CKMBINDEX, TROPONINI in the last 168 hours. BNP (last 3 results) No results for input(s):  PROBNP in the last 8760 hours. HbA1C: No results for input(s): HGBA1C in the last 72 hours. CBG: Recent Labs  Lab 10/07/20 0636 10/07/20 1139 10/07/20 1657 10/07/20 2108 10/08/20 0750  GLUCAP 97 117* 110* 160* 99   Lipid Profile: No results for input(s): CHOL, HDL, LDLCALC, TRIG, CHOLHDL, LDLDIRECT in the last 72 hours. Thyroid Function Tests: No results for input(s): TSH, T4TOTAL, FREET4, T3FREE, THYROIDAB in the last 72 hours. Anemia Panel: No results for input(s): VITAMINB12, FOLATE, FERRITIN, TIBC, IRON, RETICCTPCT in the last 72 hours. Sepsis Labs: No results for input(s): PROCALCITON, LATICACIDVEN in the last 168 hours.  No results found for this or any previous visit (from the past 240 hour(s)).       Radiology Studies: No results found.      Scheduled Meds: . amiodarone  200 mg Oral Daily  . apixaban  2.5 mg Oral BID  . feeding supplement  237 mL Oral TID BM  . finasteride  5 mg Oral BID  . iron polysaccharides  150 mg Oral Daily  . levothyroxine  125 mcg Oral QAC breakfast  . lidocaine  1 patch Transdermal Q24H  . multivitamin with minerals  1 tablet Oral Daily  . saccharomyces boulardii  250 mg Oral BID  . sertraline  25 mg Oral Daily  . spironolactone  12.5 mg Oral QHS  . torsemide  40 mg Oral Daily  . cyanocobalamin  1,000 mcg Oral Daily   Continuous Infusions:   LOS: 12 days    Time spent:40 min    Chaylee Ehrsam, Roselind Messier, MD [facility] Pager [phone]  If 7PM-7AM, please contact night-coverage [email or web address] Password [facility] 10/08/2020, 8:27 AM

## 2020-10-09 DIAGNOSIS — D638 Anemia in other chronic diseases classified elsewhere: Secondary | ICD-10-CM | POA: Diagnosis not present

## 2020-10-09 DIAGNOSIS — R1013 Epigastric pain: Secondary | ICD-10-CM | POA: Diagnosis not present

## 2020-10-09 DIAGNOSIS — E118 Type 2 diabetes mellitus with unspecified complications: Secondary | ICD-10-CM | POA: Diagnosis present

## 2020-10-09 DIAGNOSIS — K8500 Idiopathic acute pancreatitis without necrosis or infection: Secondary | ICD-10-CM | POA: Diagnosis not present

## 2020-10-09 DIAGNOSIS — Z95810 Presence of automatic (implantable) cardiac defibrillator: Secondary | ICD-10-CM

## 2020-10-09 DIAGNOSIS — N179 Acute kidney failure, unspecified: Secondary | ICD-10-CM | POA: Diagnosis not present

## 2020-10-09 DIAGNOSIS — S91302A Unspecified open wound, left foot, initial encounter: Secondary | ICD-10-CM

## 2020-10-09 LAB — COMPREHENSIVE METABOLIC PANEL
ALT: 29 U/L (ref 0–44)
AST: 74 U/L — ABNORMAL HIGH (ref 15–41)
Albumin: 1.6 g/dL — ABNORMAL LOW (ref 3.5–5.0)
Alkaline Phosphatase: 112 U/L (ref 38–126)
Anion gap: 9 (ref 5–15)
BUN: 37 mg/dL — ABNORMAL HIGH (ref 8–23)
CO2: 26 mmol/L (ref 22–32)
Calcium: 8.5 mg/dL — ABNORMAL LOW (ref 8.9–10.3)
Chloride: 101 mmol/L (ref 98–111)
Creatinine, Ser: 1.87 mg/dL — ABNORMAL HIGH (ref 0.61–1.24)
GFR, Estimated: 35 mL/min — ABNORMAL LOW (ref >60)
Glucose, Bld: 85 mg/dL (ref 70–99)
Potassium: 3.9 mmol/L (ref 3.5–5.1)
Sodium: 136 mmol/L (ref 135–145)
Total Bilirubin: 1.4 mg/dL — ABNORMAL HIGH (ref 0.3–1.2)
Total Protein: 6.9 g/dL (ref 6.5–8.1)

## 2020-10-09 LAB — CBC WITH DIFFERENTIAL/PLATELET
Abs Immature Granulocytes: 0.02 10*3/uL (ref 0.00–0.07)
Basophils Absolute: 0 10*3/uL (ref 0.0–0.1)
Basophils Relative: 1 %
Eosinophils Absolute: 0.2 10*3/uL (ref 0.0–0.5)
Eosinophils Relative: 3 %
HCT: 24.4 % — ABNORMAL LOW (ref 39.0–52.0)
Hemoglobin: 8 g/dL — ABNORMAL LOW (ref 13.0–17.0)
Immature Granulocytes: 0 %
Lymphocytes Relative: 18 %
Lymphs Abs: 1.4 10*3/uL (ref 0.7–4.0)
MCH: 32.3 pg (ref 26.0–34.0)
MCHC: 32.8 g/dL (ref 30.0–36.0)
MCV: 98.4 fL (ref 80.0–100.0)
Monocytes Absolute: 1 10*3/uL (ref 0.1–1.0)
Monocytes Relative: 13 %
Neutro Abs: 5.1 10*3/uL (ref 1.7–7.7)
Neutrophils Relative %: 65 %
Platelets: 259 10*3/uL (ref 150–400)
RBC: 2.48 MIL/uL — ABNORMAL LOW (ref 4.22–5.81)
RDW: 20.5 % — ABNORMAL HIGH (ref 11.5–15.5)
WBC: 7.9 10*3/uL (ref 4.0–10.5)
nRBC: 0 % (ref 0.0–0.2)

## 2020-10-09 LAB — MAGNESIUM: Magnesium: 1.8 mg/dL (ref 1.7–2.4)

## 2020-10-09 LAB — PHOSPHORUS: Phosphorus: 3.3 mg/dL (ref 2.5–4.6)

## 2020-10-09 LAB — GLUCOSE, CAPILLARY
Glucose-Capillary: 103 mg/dL — ABNORMAL HIGH (ref 70–99)
Glucose-Capillary: 131 mg/dL — ABNORMAL HIGH (ref 70–99)
Glucose-Capillary: 135 mg/dL — ABNORMAL HIGH (ref 70–99)
Glucose-Capillary: 141 mg/dL — ABNORMAL HIGH (ref 70–99)
Glucose-Capillary: 73 mg/dL (ref 70–99)
Glucose-Capillary: 86 mg/dL (ref 70–99)
Glucose-Capillary: 95 mg/dL (ref 70–99)

## 2020-10-09 MED ORDER — AMIODARONE HCL 200 MG PO TABS
100.0000 mg | ORAL_TABLET | Freq: Every day | ORAL | Status: DC
  Administered 2020-10-10 – 2020-10-12 (×3): 100 mg via ORAL
  Filled 2020-10-09 (×3): qty 1

## 2020-10-09 MED ORDER — SODIUM CHLORIDE 0.9 % IV BOLUS
500.0000 mL | Freq: Once | INTRAVENOUS | Status: AC
  Administered 2020-10-09: 500 mL via INTRAVENOUS

## 2020-10-09 MED ORDER — ROSUVASTATIN CALCIUM 5 MG PO TABS
10.0000 mg | ORAL_TABLET | Freq: Every day | ORAL | Status: DC
  Administered 2020-10-10 – 2020-10-11 (×2): 10 mg via ORAL
  Filled 2020-10-09 (×2): qty 2

## 2020-10-09 MED ORDER — ALBUMIN HUMAN 25 % IV SOLN
50.0000 g | Freq: Once | INTRAVENOUS | Status: AC
  Administered 2020-10-09: 50 g via INTRAVENOUS
  Filled 2020-10-09: qty 200

## 2020-10-09 NOTE — Progress Notes (Signed)
Nutrition Follow-up  DOCUMENTATION CODES:   Severe malnutrition in context of chronic illness  INTERVENTION:   -Continue Ensure Enlive po TID, each supplement provides 350 kcal and 20 grams of protein -Continue MVI with minerals daily -Magic cup TID with meals, each supplement provides 290 kcal and 9 grams of protein  NUTRITION DIAGNOSIS:   Severe Malnutrition related to chronic illness (CHF, recurrent UTI) as evidenced by severe muscle depletion, moderate fat depletion, energy intake < or equal to 75% for > or equal to 1 month, percent weight loss.  Ongoing  GOAL:   Patient will meet greater than or equal to 90% of their needs  Progressing  MONITOR:   PO intake, Supplement acceptance, Skin  REASON FOR ASSESSMENT:   Malnutrition Screening Tool    ASSESSMENT:   Pt with PMH of CHF with EF 20-25%, AICD in place, Afib, DM, HTN, stage III CKD, HLD who was recently admitted for sepsis secondary to UTI readmitted the next day from home with possible abdominal pain.  11/3- s/p BSE- advanced to dysphagia 3 diet with thin liquids  Reviewed I/O's: +47.7 L x 24 hours and -3.9 L since admission  UOP: 1.5 L x 24 hours  Pt remains with poor oral intake; noted meal completion 10-50% Medications reviewed and include vitamin B-12 and marinol (added 10/08/20). Pt is consuming Ensure Enlive supplements.   Per TOC notes, plan to d/c to SNF at discharge.   Labs reviewed: CBGS: 73-131 (inpatient orders for glycemic control are 0-6 units insulin aspart every 4 hours).   Diet Order:   Diet Order            DIET DYS 3 Room service appropriate? Yes with Assist; Fluid consistency: Thin  Diet effective now                 EDUCATION NEEDS:   Not appropriate for education at this time  Skin:  Skin Assessment: Skin Integrity Issues: Other: non-pressure wound to lt heel and MASD coccyx  Last BM:  10/06/20  Height:   Ht Readings from Last 1 Encounters:  10/02/20 5\' 7"  (1.702 m)     Weight:   Wt Readings from Last 1 Encounters:  10/09/20 76.5 kg    Ideal Body Weight:  67.3 kg  BMI:  Body mass index is 26.41 kg/m.  Estimated Nutritional Needs:   Kcal:  2100-2400  Protein:  100-120 grams  Fluid:  2 L/day    13/11/21, RD, LDN, CDCES Registered Dietitian II Certified Diabetes Care and Education Specialist Please refer to [facility] for RD and/or RD on-call/weekend/after hours pager

## 2020-10-09 NOTE — Plan of Care (Signed)

## 2020-10-09 NOTE — Progress Notes (Signed)
Physical Therapy Treatment Patient Details Name: Ryan Macdonald MRN: [id number] DOB: [birth year] Today's Date: 10/09/2020    History of Present Illness Pt is an 86 y/o male admitted secondary to pancreatitis. Pt with recent admissions for UTI. PMH includes HTN, CHF, s/p AICD, a fib, CKD, gout, DM.     PT Comments    Pt supine on arrival, drowsy initially but easily awoken and agreeable to bed-level therapy session with maximal encouragement. Pt refusing EOB/OOB mobility but performed supine A/AAROM BLE/BUE therapeutic exercises with fair tolerance, needing frequent brief rest breaks (after ~5 reps of each exercise). Pt continues to benefit from PT services to progress toward functional mobility goals. D/C recs below remain appropriate.   Follow Up Recommendations  SNF;Supervision/Assistance - 24 hour     Equipment Recommendations  None recommended by PT    Recommendations for Other Services       Precautions / Restrictions Precautions Precautions: Fall;Other (comment);ICD/Pacemaker Precaution Comments: L heel wound Restrictions Weight Bearing Restrictions: No    Mobility  Bed Mobility               General bed mobility comments: pt refusing EOB/OOB mobility  Transfers                    Ambulation/Gait                 Stairs             Wheelchair Mobility    Modified Rankin (Stroke Patients Only)       Balance                                            Cognition Arousal/Alertness: Awake/alert Behavior During Therapy: Agitated;Flat affect;Anxious Overall Cognitive Status: History of cognitive impairments - at baseline Area of Impairment: Orientation;Memory;Following commands;Awareness;Safety/judgement;Problem solving                 Orientation Level:  (oriented to self/situation, not further assessed) Current Attention Level: Selective Memory: Decreased short-term memory Following Commands:  Follows one step commands with increased time Safety/Judgement: Decreased awareness of safety;Decreased awareness of deficits   Problem Solving: Slow processing;Difficulty sequencing;Requires verbal cues;Requires tactile cues General Comments: limited participation; bed placed in chair position for pt to sip drink at end of session      Exercises General Exercises - Lower Extremity Ankle Circles/Pumps: AROM;Both;10 reps;Supine Short Arc Quad: AROM;Both;10 reps;Seated (bed in chair position) Heel Slides: AAROM;Both;10 reps;Supine Hip ABduction/ADduction: AAROM;Both;10 reps;Supine Hip Flexion/Marching: AAROM;Both;10 reps;Seated (bed in chair position)  Also performed supine BUE AROM: shoulder flexion, elbow flexion x10 reps ea bilaterally.   General Comments General comments (skin integrity, edema, etc.): unable to get EOB to assess balance today, pt refusing to attempt; pt encouraged to sit up with bed in chair position for improved hemodynamics and to perform APs throughout day      Pertinent Vitals/Pain Pain Assessment: No/denies pain Pain Descriptors / Indicators: Guarding;Grimacing Pain Intervention(s): Limited activity within patient's tolerance;Monitored during session;Repositioned   Vitals:   10/09/20 1305  BP: (!) 97/57  Pulse: 73  Resp: 15  Temp: 97.9 F (36.6 C)  SpO2: 97%    Home Living                      Prior Function  PT Goals (current goals can now be found in the care plan section) Acute Rehab PT Goals Patient Stated Goal: to get out of hospital PT Goal Formulation: Patient unable to participate in goal setting Time For Goal Achievement: 10/10/20 Potential to Achieve Goals: Poor Progress towards PT goals: Not progressing toward goals - comment (poor participation previous 2 sessions)    Frequency    Min 2X/week      PT Plan Current plan remains appropriate    Co-evaluation              AM-PAC PT "6 Clicks" Mobility    Outcome Measure  Help needed turning from your back to your side while in a flat bed without using bedrails?: A Lot Help needed moving from lying on your back to sitting on the side of a flat bed without using bedrails?: A Lot Help needed moving to and from a bed to a chair (including a wheelchair)?: Total Help needed standing up from a chair using your arms (e.g., wheelchair or bedside chair)?: Total Help needed to walk in hospital room?: Total Help needed climbing 3-5 steps with a railing? : Total 6 Click Score: 8    End of Session   Activity Tolerance: Treatment limited secondary to agitation;Patient limited by lethargy Patient left: in bed;with call bell/phone within reach;with bed alarm set (bed in high fowler position) Nurse Communication: Mobility status;Need for lift equipment (will likely need lift if transfer to chair) PT Visit Diagnosis: Muscle weakness (generalized) (M62.81);Other abnormalities of gait and mobility (R26.89);Difficulty in walking, not elsewhere classified (R26.2);Unsteadiness on feet (R26.81)     Time: 4944-9675 PT Time Calculation (min) (ACUTE ONLY): 11 min  Charges:  $Therapeutic Exercise: 8-22 mins                     Ryan Fritze P., PTA Acute Rehabilitation Services Pager: [phone] Office: [phone]   Ryan Macdonald 10/09/2020, 3:08 PM

## 2020-10-09 NOTE — Progress Notes (Signed)
PROGRESS NOTE    Ryan Macdonald  VQQ:[id number] DOB: [birth year] DOA: 09/26/2020 PCP: [facility]     Brief Narrative:   86 year old BM PMHx Chronic Systolic CHF (LVEF 20-25%, AICD in place), paroxysmal A. fib on amiodarone and Eliquis, HTN, CKD stage IIIb HLD, Gout, Hypothyroidism, recent sepsis related to UTI, discharged home on 10/28 following admission for presumed UTI,   Presented with abdominal pain. In the ED, mildly elevated AST/138 and lipase 116, CT abdomen showed right greater than left pleural effusion, periportal edema, renal stones, gastric varices, right hernia and bladder distention. Admitted for presumed pancreatitis-felt less likely. Course complicated by AKI. Labs improved, Abd. Pain resolved.  PT recommended SNF, awaiting insurance authorization.   Subjective: 11/11 afebrile overnight, A/O x4,   Assessment & Plan: Covid vaccination; vaccinated   Principal Problem:   Pancreatitis Active Problems:   Nonischemic cardiomyopathy (HCC)   Essential hypertension   Hyperlipidemia   PAF (paroxysmal atrial fibrillation) (HCC)   Hyperkalemia   Chronic renal insufficiency, stage 3 (moderate) (HCC)   ICD (implantable cardioverter-defibrillator) in place   Gout   Diabetes mellitus type 2 in obese (HCC)   Acute renal failure superimposed on stage 3 chronic kidney disease (HCC)   Transaminitis   Anemia of chronic disease   Protein-calorie malnutrition, severe (HCC)   Abdominal pain   Open wound of left heel   Diabetes mellitus type 2, controlled, with complications (HCC)   Abdominal pain, unclear etiology: >> Resolved. He presented with abdominal pain, initial lipase 116, AST 138. Abdominal CT nonrevealing for cause. Abdominal ultrasound showed possible thickening of the gallbladder. General surgery consulted and clinically not cholecystitis. HIDA scan negative. General surgery signed off. Lipase getting better.  Initiated diet,  Etiology of  his abnormal LFTs is unclear. LFTs slowly improving. AST and ALT are slightly down, total bilirubin down to 1.6 from peak of 3.2. Hepatitis profile negative.Not eating much, encouraged oral intake. -11/10 no complaint of abdominal pain today  Chronic systolic CHF/nonischemic cardiomyopathy LVEF 20-25% by last echo in September 2021.  He has implantable AICD.  -Clinically appears dehydrated  -Strict in and out -2.6 L -Daily weight Filed Weights   09/26/20 1241 10/02/20 0300 10/09/20 0500  Weight: 78.8 kg 79.8 kg 76.5 kg   Essential hypertension/Hypotension -11/11 amiodarone 200 mg daily (hold), secondary to soft BP -Spironolactone 12.5 mg daily -11/11 Albumin 50 g + bolus normal saline 537ml@100ml /hr  Paroxysmal atrial fibrillation/AICD. -11/11 decrease Amiodarone 100 mg daily; secondary to hypotension -Apixaban 2.5 mg BID -Spironolactone 12.5 mg daily -Patient NSR  Hyperlipidemia -Crestor 10 mg daily   Hyperkalemia -Resolved. Monitor BMP daily.  Acute on CKD stage IIIa (BaselineCr 1.4) -Although patient's creatinine tends to fluctuate has continued to trend up now at 1.9 -11/10 Torsemide 40 mg daily (hold) -Gently hydrate believe patient is dehydrated.  Normal saline 9ml/hr Lab Results  Component Value Date   CREATININE 1.87 (H) 10/09/2020   CREATININE 1.90 (H) 10/08/2020   CREATININE 1.83 (H) 10/07/2020   CREATININE 1.91 (H) 10/06/2020   CREATININE 1.74 (H) 10/05/2020   Gout Allopurinol held. No acute flare.  DM type II controlled with renal complications/hypoglycemia -10/29 hemoglobin A1c= 5.2 Spouse reports he was admitted to the [place] several months ago and was told that he no longer had diabetes. He was having periodic episodes of hypoglycemia, likely related to poor oral intake.  -11/10 very sensitive SSI  Anemia of chronic disease:  Stable. Continue iron and B12 supplementations. Hb remains at 7.9 -  8.0 Lab Results  Component Value Date    HGB 8.0 (L) 10/09/2020   HGB 7.8 (L) 10/08/2020   HGB 7.8 (L) 10/07/2020   HGB 8.0 (L) 10/06/2020   HGB 7.9 (L) 10/05/2020  -Transfuse for hemoglobin<7 -Stable  Malnutrition ST evaluated him during recent hospital admission and recommended dysphagia 1 diet.Requested repeat ST evaluation. -Marinol 5 mg BID  Open wound to heal WOCN RN consultation-see detailed note from 09/27/2019. Continue Prevalon boot.    DVT prophylaxis: Eliquis Code Status: Full Family Communication: 11/10 wife at bedside for discussion of plan of care answered all questions Status is: Inpatient    Dispo: The patient is from: Home              Anticipated d/c is to: Home              Anticipated d/c date is: 11/12              Patient currently unstable      Consultants:  CCS   Procedures/Significant Events:    I have personally reviewed and interpreted all radiology studies and my findings are as above.  VENTILATOR SETTINGS:    Cultures   Antimicrobials:    Devices    LINES / TUBES:      Continuous Infusions: . sodium chloride 75 mL/hr at 10/09/20 1811     Objective: Vitals:   10/09/20 1200 10/09/20 1305 10/09/20 1617 10/09/20 1809  BP: (!) 87/47 (!) 97/57 (!) 96/53 (!) 98/58  Pulse: 74 73 80 78  Resp: 16 15 16 16   Temp: 98.6 F (37 C) 97.9 F (36.6 C) 98.8 F (37.1 C)   TempSrc: Oral Oral Oral   SpO2: 100% 97% 96%   Weight:      Height:        Intake/Output Summary (Last 24 hours) at 10/09/2020 1911 Last data filed at 10/09/2020 1811 Gross per 24 hour  Intake 2644.83 ml  Output 800 ml  Net 1844.83 ml   Filed Weights   09/26/20 1241 10/02/20 0300 10/09/20 0500  Weight: 78.8 kg 79.8 kg 76.5 kg    Examination:  General: A/O x4, No acute respiratory distress Eyes: negative scleral hemorrhage, negative anisocoria, negative icterus ENT: Negative Runny nose, negative gingival bleeding, Neck:  Negative scars, masses, torticollis, lymphadenopathy,  JVD Lungs: Clear to auscultation bilaterally without wheezes or crackles Cardiovascular: Regular rate and rhythm without murmur gallop or rub normal S1 and S2 Abdomen: negative abdominal pain, nondistended, positive soft, bowel sounds, no rebound, no ascites, no appreciable mass Extremities: No significant cyanosis, clubbing, or edema bilateral lower extremities Skin: Negative rashes, lesions, ulcers Psychiatric:  Negative depression, negative anxiety, negative fatigue, negative mania  Central nervous system:  Cranial nerves II through XII intact, tongue/uvula midline, all extremities muscle strength 5/5, sensation intact throughout, negative dysarthria, negative expressive aphasia, negative receptive aphasia.  .     Data Reviewed: Care during the described time interval was provided by me .  I have reviewed this patient's available data, including medical history, events of note, physical examination, and all test results as part of my evaluation.  CBC: Recent Labs  Lab 10/05/20 0151 10/06/20 0251 10/07/20 0038 10/08/20 0557 10/09/20 0141  WBC  --   --   --  8.6 7.9  NEUTROABS  --   --   --   --  5.1  HGB 7.9* 8.0* 7.8* 7.8* 8.0*  HCT 23.6* 24.5* 23.9* 23.6* 24.4*  MCV  --   --   --  97.9 98.4  PLT  --   --   --  293 259   Basic Metabolic Panel: Recent Labs  Lab 10/03/20 0358 10/04/20 0517 10/05/20 0151 10/06/20 0251 10/07/20 0038 10/08/20 0557 10/09/20 0141  NA 135   < > 136 139 136 136 136  K 3.9   < > 3.8 3.8 3.8 4.0 3.9  CL 100   < > 100 103 99 100 101  CO2 26   < > 27 27 26 26 26   GLUCOSE 86   < > 160* 175* 88 102* 85  BUN 29*   < > 32* 35* 34* 38* 37*  CREATININE 1.65*   < > 1.74* 1.91* 1.83* 1.90* 1.87*  CALCIUM 8.5*   < > 8.6* 8.7* 8.8* 8.7* 8.5*  MG 2.2  --   --   --  1.8 1.7 1.8  PHOS 2.9  --   --   --  2.6 2.9 3.3   < > = values in this interval not displayed.   GFR: Estimated Creatinine Clearance: 27.5 mL/min (A) (by C-G formula based on SCr of 1.87  mg/dL (H)). Liver Function Tests: Recent Labs  Lab 10/04/20 0517 10/06/20 0251 10/09/20 0141  AST 85* 84* 74*  ALT 36 33 29  ALKPHOS 133* 128* 112  BILITOT 1.6* 1.2 1.4*  PROT 6.9 6.9 6.9  ALBUMIN 1.6* 1.6* 1.6*   Recent Labs  Lab 10/04/20 0517  LIPASE 65*   No results for input(s): AMMONIA in the last 168 hours. Coagulation Profile: No results for input(s): INR, PROTIME in the last 168 hours. Cardiac Enzymes: No results for input(s): CKTOTAL, CKMB, CKMBINDEX, TROPONINI in the last 168 hours. BNP (last 3 results) No results for input(s): PROBNP in the last 8760 hours. HbA1C: No results for input(s): HGBA1C in the last 72 hours. CBG: Recent Labs  Lab 10/09/20 0000 10/09/20 0418 10/09/20 0643 10/09/20 1200 10/09/20 1609  GLUCAP 103* 73 131* 95 141*   Lipid Profile: No results for input(s): CHOL, HDL, LDLCALC, TRIG, CHOLHDL, LDLDIRECT in the last 72 hours. Thyroid Function Tests: No results for input(s): TSH, T4TOTAL, FREET4, T3FREE, THYROIDAB in the last 72 hours. Anemia Panel: No results for input(s): VITAMINB12, FOLATE, FERRITIN, TIBC, IRON, RETICCTPCT in the last 72 hours. Sepsis Labs: No results for input(s): PROCALCITON, LATICACIDVEN in the last 168 hours.  Recent Results (from the past 240 hour(s))  SARS Coronavirus 2 by RT PCR (hospital order, performed in [facility] hospital lab) Nasopharyngeal Nasopharyngeal Swab     Status: None   Collection Time: 10/08/20 11:06 AM   Specimen: Nasopharyngeal Swab  Result Value Ref Range Status   SARS Coronavirus 2 NEGATIVE NEGATIVE Final    Comment: (NOTE) SARS-CoV-2 target nucleic acids are NOT DETECTED.  The SARS-CoV-2 RNA is generally detectable in upper and lower respiratory specimens during the acute phase of infection. The lowest concentration of SARS-CoV-2 viral copies this assay can detect is 250 copies / mL. A negative result does not preclude SARS-CoV-2 infection and should not be used as the sole basis  for treatment or other patient management decisions.  A negative result may occur with improper specimen collection / handling, submission of specimen other than nasopharyngeal swab, presence of viral mutation(s) within the areas targeted by this assay, and inadequate number of viral copies (<250 copies / mL). A negative result must be combined with clinical observations, patient history, and epidemiological information.  Fact Sheet for Patients:   BoilerBrush.com.cy  Fact Sheet for Healthcare Providers:  https://pope.com/  This test is not yet approved or  cleared by the [place] and has been authorized for detection and/or diagnosis of SARS-CoV-2 by FDA under an Emergency Use Authorization (EUA).  This EUA will remain in effect (meaning this test can be used) for the duration of the COVID-19 declaration under Section 564(b)(1) of the Act, 21 U.S.C. section 360bbb-3(b)(1), unless the authorization is terminated or revoked sooner.  Performed at [facility], [address], [place], [place] [address]          Radiology Studies: No results found.      Scheduled Meds: . [START ON 10/10/2020] amiodarone  100 mg Oral Daily  . apixaban  2.5 mg Oral BID  . dronabinol  5 mg Oral BID AC  . feeding supplement  237 mL Oral TID BM  . finasteride  5 mg Oral BID  . insulin aspart  0-6 Units Subcutaneous Q4H  . iron polysaccharides  150 mg Oral Daily  . levothyroxine  125 mcg Oral QAC breakfast  . lidocaine  1 patch Transdermal Q24H  . multivitamin with minerals  1 tablet Oral Daily  . [START ON 10/10/2020] rosuvastatin  10 mg Oral q1800  . saccharomyces boulardii  250 mg Oral BID  . sertraline  25 mg Oral Daily  . spironolactone  12.5 mg Oral QHS  . cyanocobalamin  1,000 mcg Oral Daily   Continuous Infusions: . sodium chloride 75 mL/hr at 10/09/20 1811     LOS: 13 days    Time spent:40 min    Rasheen Bells,  Roselind Messier, MD [facility] Pager [phone]  If 7PM-7AM, please contact night-coverage [email or web address] Password [facility] 10/09/2020, 7:11 PM

## 2020-10-10 DIAGNOSIS — R1013 Epigastric pain: Secondary | ICD-10-CM | POA: Diagnosis not present

## 2020-10-10 DIAGNOSIS — K8500 Idiopathic acute pancreatitis without necrosis or infection: Secondary | ICD-10-CM | POA: Diagnosis not present

## 2020-10-10 DIAGNOSIS — N179 Acute kidney failure, unspecified: Secondary | ICD-10-CM | POA: Diagnosis not present

## 2020-10-10 DIAGNOSIS — D638 Anemia in other chronic diseases classified elsewhere: Secondary | ICD-10-CM | POA: Diagnosis not present

## 2020-10-10 LAB — GLUCOSE, CAPILLARY
Glucose-Capillary: 145 mg/dL — ABNORMAL HIGH (ref 70–99)
Glucose-Capillary: 84 mg/dL (ref 70–99)
Glucose-Capillary: 93 mg/dL (ref 70–99)
Glucose-Capillary: 116 mg/dL — ABNORMAL HIGH (ref 70–99)
Glucose-Capillary: 104 mg/dL — ABNORMAL HIGH (ref 70–99)

## 2020-10-10 LAB — CBC WITH DIFFERENTIAL/PLATELET
Abs Immature Granulocytes: 0.06 10*3/uL (ref 0.00–0.07)
Basophils Absolute: 0 10*3/uL (ref 0.0–0.1)
Basophils Relative: 0 %
Eosinophils Absolute: 0.2 10*3/uL (ref 0.0–0.5)
Eosinophils Relative: 2 %
HCT: 20.9 % — ABNORMAL LOW (ref 39.0–52.0)
Hemoglobin: 6.8 g/dL — CL (ref 13.0–17.0)
Immature Granulocytes: 1 %
Lymphocytes Relative: 19 %
Lymphs Abs: 1.2 10*3/uL (ref 0.7–4.0)
MCH: 32.4 pg (ref 26.0–34.0)
MCHC: 32.5 g/dL (ref 30.0–36.0)
MCV: 99.5 fL (ref 80.0–100.0)
Monocytes Absolute: 0.7 10*3/uL (ref 0.1–1.0)
Monocytes Relative: 11 %
Neutro Abs: 4.3 10*3/uL (ref 1.7–7.7)
Neutrophils Relative %: 67 %
Platelets: 251 10*3/uL (ref 150–400)
RBC: 2.1 MIL/uL — ABNORMAL LOW (ref 4.22–5.81)
RDW: 20.4 % — ABNORMAL HIGH (ref 11.5–15.5)
WBC: 6.4 10*3/uL (ref 4.0–10.5)
nRBC: 0 % (ref 0.0–0.2)

## 2020-10-10 LAB — COMPREHENSIVE METABOLIC PANEL
ALT: 25 U/L (ref 0–44)
AST: 66 U/L — ABNORMAL HIGH (ref 15–41)
Albumin: 2 g/dL — ABNORMAL LOW (ref 3.5–5.0)
Alkaline Phosphatase: 97 U/L (ref 38–126)
Anion gap: 9 (ref 5–15)
BUN: 34 mg/dL — ABNORMAL HIGH (ref 8–23)
CO2: 25 mmol/L (ref 22–32)
Calcium: 8.5 mg/dL — ABNORMAL LOW (ref 8.9–10.3)
Chloride: 103 mmol/L (ref 98–111)
Creatinine, Ser: 1.82 mg/dL — ABNORMAL HIGH (ref 0.61–1.24)
GFR, Estimated: 36 mL/min — ABNORMAL LOW (ref >60)
Glucose, Bld: 184 mg/dL — ABNORMAL HIGH (ref 70–99)
Potassium: 3.3 mmol/L — ABNORMAL LOW (ref 3.5–5.1)
Sodium: 137 mmol/L (ref 135–145)
Total Bilirubin: 1.4 mg/dL — ABNORMAL HIGH (ref 0.3–1.2)
Total Protein: 6.6 g/dL (ref 6.5–8.1)

## 2020-10-10 LAB — HEMOGLOBIN AND HEMATOCRIT, BLOOD
HCT: 21.1 % — ABNORMAL LOW (ref 39.0–52.0)
Hemoglobin: 7 g/dL — ABNORMAL LOW (ref 13.0–17.0)

## 2020-10-10 LAB — CBC
HCT: 25.2 % — ABNORMAL LOW (ref 39.0–52.0)
Hemoglobin: 8.5 g/dL — ABNORMAL LOW (ref 13.0–17.0)
MCH: 32.3 pg (ref 26.0–34.0)
MCHC: 33.7 g/dL (ref 30.0–36.0)
MCV: 95.8 fL (ref 80.0–100.0)
Platelets: 291 10*3/uL (ref 150–400)
RBC: 2.63 MIL/uL — ABNORMAL LOW (ref 4.22–5.81)
RDW: 20.2 % — ABNORMAL HIGH (ref 11.5–15.5)
WBC: 8.1 10*3/uL (ref 4.0–10.5)
nRBC: 0.2 % (ref 0.0–0.2)

## 2020-10-10 LAB — PREPARE RBC (CROSSMATCH)

## 2020-10-10 LAB — LIPID PANEL
Cholesterol: 71 mg/dL (ref 0–200)
HDL: 10 mg/dL — ABNORMAL LOW (ref >40)
LDL Cholesterol: 49 mg/dL (ref 0–99)
Total CHOL/HDL Ratio: 7.1 RATIO
Triglycerides: 60 mg/dL (ref <150)
VLDL: 12 mg/dL (ref 0–40)

## 2020-10-10 LAB — MAGNESIUM: Magnesium: 1.8 mg/dL (ref 1.7–2.4)

## 2020-10-10 LAB — PHOSPHORUS: Phosphorus: 2.5 mg/dL (ref 2.5–4.6)

## 2020-10-10 LAB — TSH: TSH: 1.64 u[IU]/mL (ref 0.350–4.500)

## 2020-10-10 MED ORDER — MAGNESIUM SULFATE 2 GM/50ML IV SOLN
2.0000 g | Freq: Once | INTRAVENOUS | Status: AC
  Administered 2020-10-10: 2 g via INTRAVENOUS
  Filled 2020-10-10: qty 50

## 2020-10-10 MED ORDER — SODIUM CHLORIDE 0.9% IV SOLUTION: Freq: Once | INTRAVENOUS | Status: AC

## 2020-10-10 MED ORDER — POTASSIUM CHLORIDE CRYS ER 20 MEQ PO TBCR
50.0000 meq | EXTENDED_RELEASE_TABLET | Freq: Once | ORAL | Status: AC
  Administered 2020-10-10: 50 meq via ORAL
  Filled 2020-10-10: qty 1

## 2020-10-10 NOTE — Consult Note (Addendum)
WOC Nurse Consult Note: Reason for Consult: Buttock lesions.  Moisture associated skin damage with friction related skin loss.  Not pressure. Also reevaluated left heel PI Wound type: Pressure, left heel. Moisture plus friction to buttocks Pressure Injury POA: Yes, left heel.  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  4cm x 3cm area with pink, moist center measuring 2.5cm x 1.6 x 0.2cm. Area of dried blood surrounds open area and is stable. Expect this will peel away to reveal healed tissue in the distant future. Wound bed:As described above Drainage (amount, consistency, odor) As described above Periwound: intact Dressing procedure/placement/frequency: I will provide patient with a mattress replacement with low air loss feature. It is noted that he is less easily turned and repositioned than during my previous assessment on 09/21/20.  The moisture lesions and left heel DTPI in evolution (now a Stage 3) are assessed and POC adjusted.  Patient will be provided with a Prevalon boot for the right foot today. Orders for xeroform gauze to the buttock lesions with silicone foam topper are provided. The pressure redistribution chair pad ordered previously is in the room.   WOC nursing team will not follow, but will remain available to this patient, the nursing and medical teams.  Please re-consult if needed. Thanks, Ladona Mow, MSN, RN, GNP, [facility]  Pager# [phone]

## 2020-10-10 NOTE — Progress Notes (Signed)
PROGRESS NOTE    Ryan Macdonald  ALP:[id number] DOB: May 26, 1934 DOA: 09/26/2020 PCP: [facility]     Brief Narrative:   86 year old BM PMHx Chronic Systolic CHF (LVEF 20-25%, AICD in place), paroxysmal A. fib on amiodarone and Eliquis, HTN, CKD stage IIIb HLD, Gout, Hypothyroidism, recent sepsis related to UTI, discharged home on 10/28 following admission for presumed UTI,   Presented with abdominal pain. In the ED, mildly elevated AST/138 and lipase 116, CT abdomen showed right greater than left pleural effusion, periportal edema, renal stones, gastric varices, right hernia and bladder distention. Admitted for presumed pancreatitis-felt less likely. Course complicated by AKI. Labs improved, Abd. Pain resolved.  PT recommended SNF, awaiting insurance authorization.   Subjective: 11/12 afebrile overnight A/O x4, afebrile overnight   Assessment & Plan: Covid vaccination; vaccinated   Principal Problem:   Pancreatitis Active Problems:   Nonischemic cardiomyopathy (HCC)   Essential hypertension   Hyperlipidemia   PAF (paroxysmal atrial fibrillation) (HCC)   Hyperkalemia   Chronic renal insufficiency, stage 3 (moderate) (HCC)   ICD (implantable cardioverter-defibrillator) in place   Gout   Diabetes mellitus type 2 in obese (HCC)   Acute renal failure superimposed on stage 3 chronic kidney disease (HCC)   Transaminitis   Anemia of chronic disease   Protein-calorie malnutrition, severe (HCC)   Abdominal pain   Open wound of left heel   Diabetes mellitus type 2, controlled, with complications (HCC)   Abdominal pain, unclear etiology: >> Resolved. He presented with abdominal pain, initial lipase 116, AST 138. Abdominal CT nonrevealing for cause. Abdominal ultrasound showed possible thickening of the gallbladder. General surgery consulted and clinically not cholecystitis. HIDA scan negative. General surgery signed off. Lipase getting better.  Initiated  diet,  Etiology of his abnormal LFTs is unclear. LFTs slowly improving. AST and ALT are slightly down, total bilirubin down to 1.6 from peak of 3.2. Hepatitis profile negative.Not eating much, encouraged oral intake. -11/10 no complaint of abdominal pain today  Chronic systolic CHF/nonischemic cardiomyopathy LVEF 20-25% by last echo in September 2021.  He has implantable AICD.  -Clinically appears dehydrated  -Strict in and out -1.9 L -Daily weight Filed Weights   10/02/20 0300 10/09/20 0500 10/10/20 0500  Weight: 79.8 kg 76.5 kg 77 kg   Essential hypertension/Hypotension -11/11 amiodarone 200 mg daily (hold), secondary to soft BP -Spironolactone 12.5 mg daily -11/11 Albumin 50 g + bolus normal saline 534ml@100ml /hr -11/12 resolved  Paroxysmal atrial fibrillation/AICD. -11/11 decrease Amiodarone 100 mg daily; secondary to hypotension -Apixaban 2.5 mg BID -Spironolactone 12.5 mg daily -Patient NSR  Hyperlipidemia -Crestor 10 mg daily   Hypokalemia -Potassium goal> 4 -K-Dur 50 mEq.  Hypomagnesmia -Magnesium goal> 2 -Magnesium IV 2 g  Acute on CKD stage IIIa (BaselineCr 1.4) -Although patient's creatinine tends to fluctuate has continued to trend up now at 1.9 -11/10 Torsemide 40 mg daily (hold) -Gently hydrate believe patient is dehydrated.  Normal saline 70ml/hr Lab Results  Component Value Date   CREATININE 1.82 (H) 10/10/2020   CREATININE 1.87 (H) 10/09/2020   CREATININE 1.90 (H) 10/08/2020   CREATININE 1.83 (H) 10/07/2020   CREATININE 1.91 (H) 10/06/2020  -Although not at goal trending down  Gout Allopurinol held. No acute flare.  DM type II controlled with renal complications/hypoglycemia -10/29 hemoglobin A1c= 5.2 Spouse reports he was admitted to the [place] several months ago and was told that he no longer had diabetes. He was having periodic episodes of hypoglycemia, likely related to poor oral intake.  -  11/10 very sensitive SSI  Anemia of  chronic disease:  Stable. Continue iron and B12 supplementations. Hb remains at 7.9 - 8.0 Lab Results  Component Value Date   HGB 7.0 (L) 10/10/2020   HGB 6.8 (LL) 10/10/2020   HGB 8.0 (L) 10/09/2020   HGB 7.8 (L) 10/08/2020   HGB 7.8 (L) 10/07/2020  -Transfuse for hemoglobin<7 -11/12 transfuse 1 unit PRBC  Malnutrition ST evaluated him during recent hospital admission and recommended dysphagia 1 diet.Requested repeat ST evaluation. -Marinol 5 mg BID  Open wound to heal [facility] RN consultation-see detailed note from 09/27/2019. Continue Prevalon boot.    DVT prophylaxis: Eliquis Code Status: Full Family Communication: 11/12 wife at bedside for discussion of plan of care answered all questions Status is: Inpatient    Dispo: The patient is from: Home              Anticipated d/c is to: Home              Anticipated d/c date is: 11/13 or 11/14              Patient currently unstable      Consultants:  CCS   Procedures/Significant Events:    I have personally reviewed and interpreted all radiology studies and my findings are as above.  VENTILATOR SETTINGS:    Cultures   Antimicrobials: Anti-infectives (From admission, onward)   None       Devices    LINES / TUBES:      Continuous Infusions: . sodium chloride 75 mL/hr at 10/10/20 0647     Objective: Vitals:   10/09/20 2002 10/10/20 0409 10/10/20 0500 10/10/20 0804  BP: (!) 99/57 (!) 99/53  (!) 103/54  Pulse: 79 80  78  Resp: 16 16  17   Temp: 98.1 F (36.7 C) 97.7 F (36.5 C)  98.3 F (36.8 C)  TempSrc:    Oral  SpO2: 95% 98%  98%  Weight:   77 kg   Height:        Intake/Output Summary (Last 24 hours) at 10/10/2020 0856 Last data filed at 10/10/2020 0600 Gross per 24 hour  Intake 2513.33 ml  Output 300 ml  Net 2213.33 ml   Filed Weights   10/02/20 0300 10/09/20 0500 10/10/20 0500  Weight: 79.8 kg 76.5 kg 77 kg    Examination:  General: A/O x4, No acute respiratory  distress Eyes: negative scleral hemorrhage, negative anisocoria, negative icterus ENT: Negative Runny nose, negative gingival bleeding, Neck:  Negative scars, masses, torticollis, lymphadenopathy, JVD Lungs: Clear to auscultation bilaterally without wheezes or crackles Cardiovascular: Regular rate and rhythm without murmur gallop or rub normal S1 and S2 Abdomen: negative abdominal pain, nondistended, positive soft, bowel sounds, no rebound, no ascites, no appreciable mass Extremities: No significant cyanosis, clubbing, or edema bilateral lower extremities Skin: Negative rashes, lesions, ulcers Psychiatric:  Negative depression, negative anxiety, negative fatigue, negative mania  Central nervous system:  Cranial nerves II through XII intact, tongue/uvula midline, all extremities muscle strength 5/5, sensation intact throughout, negative dysarthria, negative expressive aphasia, negative receptive aphasia.  .     Data Reviewed: Care during the described time interval was provided by me .  I have reviewed this patient's available data, including medical history, events of note, physical examination, and all test results as part of my evaluation.  CBC: Recent Labs  Lab 10/07/20 0038 10/08/20 0557 10/09/20 0141 10/10/20 0223 10/10/20 0623  WBC  --  8.6 7.9 6.4  --  NEUTROABS  --   --  5.1 4.3  --   HGB 7.8* 7.8* 8.0* 6.8* 7.0*  HCT 23.9* 23.6* 24.4* 20.9* 21.1*  MCV  --  97.9 98.4 99.5  --   PLT  --  293 259 251  --    Basic Metabolic Panel: Recent Labs  Lab 10/06/20 0251 10/07/20 0038 10/08/20 0557 10/09/20 0141 10/10/20 0223  NA 139 136 136 136 137  K 3.8 3.8 4.0 3.9 3.3*  CL 103 99 100 101 103  CO2 27 26 26 26 25   GLUCOSE 175* 88 102* 85 184*  BUN 35* 34* 38* 37* 34*  CREATININE 1.91* 1.83* 1.90* 1.87* 1.82*  CALCIUM 8.7* 8.8* 8.7* 8.5* 8.5*  MG  --  1.8 1.7 1.8 1.8  PHOS  --  2.6 2.9 3.3 2.5   GFR: Estimated Creatinine Clearance: 28.2 mL/min (A) (by C-G formula  based on SCr of 1.82 mg/dL (H)). Liver Function Tests: Recent Labs  Lab 10/04/20 0517 10/06/20 0251 10/09/20 0141 10/10/20 0223  AST 85* 84* 74* 66*  ALT 36 33 29 25  ALKPHOS 133* 128* 112 97  BILITOT 1.6* 1.2 1.4* 1.4*  PROT 6.9 6.9 6.9 6.6  ALBUMIN 1.6* 1.6* 1.6* 2.0*   Recent Labs  Lab 10/04/20 0517  LIPASE 65*   No results for input(s): AMMONIA in the last 168 hours. Coagulation Profile: No results for input(s): INR, PROTIME in the last 168 hours. Cardiac Enzymes: No results for input(s): CKTOTAL, CKMB, CKMBINDEX, TROPONINI in the last 168 hours. BNP (last 3 results) No results for input(s): PROBNP in the last 8760 hours. HbA1C: No results for input(s): HGBA1C in the last 72 hours. CBG: Recent Labs  Lab 10/09/20 1609 10/09/20 2002 10/09/20 2353 10/10/20 0408 10/10/20 0807  GLUCAP 141* 135* 86 145* 84   Lipid Profile: Recent Labs    10/10/20 0223  CHOL 71  HDL 10*  LDLCALC 49  TRIG 60  CHOLHDL 7.1   Thyroid Function Tests: Recent Labs    10/10/20 0223  TSH 1.640   Anemia Panel: No results for input(s): VITAMINB12, FOLATE, FERRITIN, TIBC, IRON, RETICCTPCT in the last 72 hours. Sepsis Labs: No results for input(s): PROCALCITON, LATICACIDVEN in the last 168 hours.  Recent Results (from the past 240 hour(s))  SARS Coronavirus 2 by RT PCR (hospital order, performed in [facility] hospital lab) Nasopharyngeal Nasopharyngeal Swab     Status: None   Collection Time: 10/08/20 11:06 AM   Specimen: Nasopharyngeal Swab  Result Value Ref Range Status   SARS Coronavirus 2 NEGATIVE NEGATIVE Final    Comment: (NOTE) SARS-CoV-2 target nucleic acids are NOT DETECTED.  The SARS-CoV-2 RNA is generally detectable in upper and lower respiratory specimens during the acute phase of infection. The lowest concentration of SARS-CoV-2 viral copies this assay can detect is 250 copies / mL. A negative result does not preclude SARS-CoV-2 infection and should not be used  as the sole basis for treatment or other patient management decisions.  A negative result may occur with improper specimen collection / handling, submission of specimen other than nasopharyngeal swab, presence of viral mutation(s) within the areas targeted by this assay, and inadequate number of viral copies (<250 copies / mL). A negative result must be combined with clinical observations, patient history, and epidemiological information.  Fact Sheet for Patients:   13/10/21  Fact Sheet for Healthcare Providers: BoilerBrush.com.cy  This test is not yet approved or  cleared by the https://pope.com/ FDA and has been  authorized for detection and/or diagnosis of SARS-CoV-2 by FDA under an Emergency Use Authorization (EUA).  This EUA will remain in effect (meaning this test can be used) for the duration of the COVID-19 declaration under Section 564(b)(1) of the Act, 21 U.S.C. section 360bbb-3(b)(1), unless the authorization is terminated or revoked sooner.  Performed at [facility], [address] [place], [place] [address]          Radiology Studies: No results found.      Scheduled Meds: . amiodarone  100 mg Oral Daily  . apixaban  2.5 mg Oral BID  . dronabinol  5 mg Oral BID AC  . feeding supplement  237 mL Oral TID BM  . finasteride  5 mg Oral BID  . insulin aspart  0-6 Units Subcutaneous Q4H  . iron polysaccharides  150 mg Oral Daily  . levothyroxine  125 mcg Oral QAC breakfast  . lidocaine  1 patch Transdermal Q24H  . multivitamin with minerals  1 tablet Oral Daily  . rosuvastatin  10 mg Oral q1800  . saccharomyces boulardii  250 mg Oral BID  . sertraline  25 mg Oral Daily  . spironolactone  12.5 mg Oral QHS  . cyanocobalamin  1,000 mcg Oral Daily   Continuous Infusions: . sodium chloride 75 mL/hr at 10/10/20 0647     LOS: 14 days    Time spent:40 min    Sarafina Puthoff, Roselind Messier, MD [facility] Pager [phone]  If 7PM-7AM, please contact night-coverage [email or web address] Password [facility] 10/10/2020, 8:56 AM

## 2020-10-11 ENCOUNTER — Encounter (HOSPITAL_COMMUNITY): Payer: Self-pay | Admitting: Family Medicine

## 2020-10-11 DIAGNOSIS — N179 Acute kidney failure, unspecified: Secondary | ICD-10-CM | POA: Diagnosis not present

## 2020-10-11 DIAGNOSIS — K8500 Idiopathic acute pancreatitis without necrosis or infection: Secondary | ICD-10-CM | POA: Diagnosis not present

## 2020-10-11 DIAGNOSIS — R1013 Epigastric pain: Secondary | ICD-10-CM | POA: Diagnosis not present

## 2020-10-11 DIAGNOSIS — D638 Anemia in other chronic diseases classified elsewhere: Secondary | ICD-10-CM | POA: Diagnosis not present

## 2020-10-11 LAB — COMPREHENSIVE METABOLIC PANEL WITH GFR
ALT: 26 U/L (ref 0–44)
AST: 67 U/L — ABNORMAL HIGH (ref 15–41)
Albumin: 2 g/dL — ABNORMAL LOW (ref 3.5–5.0)
Alkaline Phosphatase: 93 U/L (ref 38–126)
Anion gap: 8 (ref 5–15)
BUN: 24 mg/dL — ABNORMAL HIGH (ref 8–23)
CO2: 24 mmol/L (ref 22–32)
Calcium: 8.4 mg/dL — ABNORMAL LOW (ref 8.9–10.3)
Chloride: 108 mmol/L (ref 98–111)
Creatinine, Ser: 1.39 mg/dL — ABNORMAL HIGH (ref 0.61–1.24)
GFR, Estimated: 50 mL/min — ABNORMAL LOW
Glucose, Bld: 105 mg/dL — ABNORMAL HIGH (ref 70–99)
Potassium: 3.4 mmol/L — ABNORMAL LOW (ref 3.5–5.1)
Sodium: 140 mmol/L (ref 135–145)
Total Bilirubin: 2.1 mg/dL — ABNORMAL HIGH (ref 0.3–1.2)
Total Protein: 6.7 g/dL (ref 6.5–8.1)

## 2020-10-11 LAB — CBC WITH DIFFERENTIAL/PLATELET
Abs Immature Granulocytes: 0.02 10*3/uL (ref 0.00–0.07)
Basophils Absolute: 0 10*3/uL (ref 0.0–0.1)
Basophils Relative: 0 %
Eosinophils Absolute: 0.2 10*3/uL (ref 0.0–0.5)
Eosinophils Relative: 2 %
HCT: 26.4 % — ABNORMAL LOW (ref 39.0–52.0)
Hemoglobin: 8.7 g/dL — ABNORMAL LOW (ref 13.0–17.0)
Immature Granulocytes: 0 %
Lymphocytes Relative: 17 %
Lymphs Abs: 1.4 10*3/uL (ref 0.7–4.0)
MCH: 32 pg (ref 26.0–34.0)
MCHC: 33 g/dL (ref 30.0–36.0)
MCV: 97.1 fL (ref 80.0–100.0)
Monocytes Absolute: 1.2 10*3/uL — ABNORMAL HIGH (ref 0.1–1.0)
Monocytes Relative: 15 %
Neutro Abs: 5.3 10*3/uL (ref 1.7–7.7)
Neutrophils Relative %: 66 %
Platelets: 256 10*3/uL (ref 150–400)
RBC: 2.72 MIL/uL — ABNORMAL LOW (ref 4.22–5.81)
RDW: 20.2 % — ABNORMAL HIGH (ref 11.5–15.5)
WBC: 8.1 10*3/uL (ref 4.0–10.5)
nRBC: 0 % (ref 0.0–0.2)

## 2020-10-11 LAB — BPAM RBC
Blood Product Expiration Date: 202112142359
ISSUE DATE / TIME: 202111121425
Unit Type and Rh: 5100

## 2020-10-11 LAB — PHOSPHORUS: Phosphorus: 2.5 mg/dL (ref 2.5–4.6)

## 2020-10-11 LAB — TYPE AND SCREEN
ABO/RH(D): O POS
Antibody Screen: NEGATIVE
Unit division: 0

## 2020-10-11 LAB — GLUCOSE, CAPILLARY
Glucose-Capillary: 101 mg/dL — ABNORMAL HIGH (ref 70–99)
Glucose-Capillary: 104 mg/dL — ABNORMAL HIGH (ref 70–99)
Glucose-Capillary: 105 mg/dL — ABNORMAL HIGH (ref 70–99)
Glucose-Capillary: 110 mg/dL — ABNORMAL HIGH (ref 70–99)
Glucose-Capillary: 127 mg/dL — ABNORMAL HIGH (ref 70–99)
Glucose-Capillary: 93 mg/dL (ref 70–99)

## 2020-10-11 LAB — MAGNESIUM
Magnesium: 2.1 mg/dL (ref 1.7–2.4)
Magnesium: 2.2 mg/dL (ref 1.7–2.4)

## 2020-10-11 LAB — SARS CORONAVIRUS 2 BY RT PCR (HOSPITAL ORDER, PERFORMED IN ~~LOC~~ HOSPITAL LAB): SARS Coronavirus 2: NEGATIVE

## 2020-10-11 LAB — POTASSIUM: Potassium: 5.2 mmol/L — ABNORMAL HIGH (ref 3.5–5.1)

## 2020-10-11 MED ORDER — POTASSIUM CHLORIDE 10 MEQ/100ML IV SOLN
10.0000 meq | INTRAVENOUS | Status: AC
  Administered 2020-10-11 (×3): 10 meq via INTRAVENOUS
  Filled 2020-10-11 (×3): qty 100

## 2020-10-11 MED ORDER — POTASSIUM CHLORIDE CRYS ER 20 MEQ PO TBCR
50.0000 meq | EXTENDED_RELEASE_TABLET | Freq: Once | ORAL | Status: AC
  Administered 2020-10-11: 50 meq via ORAL
  Filled 2020-10-11: qty 1

## 2020-10-11 NOTE — Progress Notes (Signed)
PROGRESS NOTE    Ryan Macdonald  ZTI:[id number] DOB: [birth year] DOA: 09/26/2020 PCP: [facility]     Brief Narrative:   86 year old BM PMHx Chronic Systolic CHF (LVEF 20-25%, AICD in place), paroxysmal A. fib on amiodarone and Eliquis, HTN, CKD stage IIIb HLD, Gout, Hypothyroidism, recent sepsis related to UTI, discharged home on 10/28 following admission for presumed UTI,   Presented with abdominal pain. In the ED, mildly elevated AST/138 and lipase 116, CT abdomen showed right greater than left pleural effusion, periportal edema, renal stones, gastric varices, right hernia and bladder distention. Admitted for presumed pancreatitis-felt less likely. Course complicated by AKI. Labs improved, Abd. Pain resolved.  PT recommended SNF, awaiting insurance authorization.   Subjective: 11/13 afebrile overnight A/O x4 resting comfortably   Assessment & Plan: Covid vaccination; vaccinated   Principal Problem:   Pancreatitis Active Problems:   Nonischemic cardiomyopathy (HCC)   Essential hypertension   Hyperlipidemia   PAF (paroxysmal atrial fibrillation) (HCC)   Hyperkalemia   Chronic renal insufficiency, stage 3 (moderate) (HCC)   ICD (implantable cardioverter-defibrillator) in place   Gout   Diabetes mellitus type 2 in obese (HCC)   Acute renal failure superimposed on stage 3 chronic kidney disease (HCC)   Transaminitis   Anemia of chronic disease   Protein-calorie malnutrition, severe (HCC)   Abdominal pain   Open wound of left heel   Diabetes mellitus type 2, controlled, with complications (HCC)   Abdominal pain, unclear etiology: >> Resolved. He presented with abdominal pain, initial lipase 116, AST 138. Abdominal CT nonrevealing for cause. Abdominal ultrasound showed possible thickening of the gallbladder. General surgery consulted and clinically not cholecystitis. HIDA scan negative. General surgery signed off. Lipase getting better.  Initiated  diet,  Etiology of his abnormal LFTs is unclear. LFTs slowly improving. AST and ALT are slightly down, total bilirubin down to 1.6 from peak of 3.2. Hepatitis profile negative.Not eating much, encouraged oral intake. -11/10 no complaint of abdominal pain today  Chronic systolic CHF/nonischemic cardiomyopathy LVEF 20-25% by last echo in September 2021.  He has implantable AICD.  -Clinically appears dehydrated  -Strict in and out -295.66ml -Daily weight Filed Weights   10/09/20 0500 10/10/20 0500 10/11/20 0500  Weight: 76.5 kg 77 kg 66.7 kg   Essential hypertension/Hypotension -11/11 amiodarone 200 mg daily (hold), secondary to soft BP -Spironolactone 12.5 mg daily -11/11 Albumin 50 g + bolus normal saline 543ml@100ml /hr -11/13 normal saline KVO  Paroxysmal atrial fibrillation/AICD. -11/11 decrease Amiodarone 100 mg daily; secondary to hypotension -Apixaban 2.5 mg BID -Spironolactone 12.5 mg daily -Patient NSR  Hyperlipidemia -Crestor 10 mg daily   Hypokalemia -Potassium goal> 4 -11/13 K-Dur 50 mEq.+ Potassium IV 30 mEq -Repeat K/Mg @1500   Hypomagnesmia -Magnesium goal> 2   Acute on CKD stage IIIa (BaselineCr 1.4) -Although patient's creatinine tends to fluctuate has continued to trend up now at 1.9 -11/10 Torsemide 40 mg daily (hold) -11/13 KVO normal saline Lab Results  Component Value Date   CREATININE 1.39 (H) 10/11/2020   CREATININE 1.82 (H) 10/10/2020   CREATININE 1.87 (H) 10/09/2020   CREATININE 1.90 (H) 10/08/2020   CREATININE 1.83 (H) 10/07/2020  -11/13 creatinine WNL for patient   Gout -Allopurinol held. No acute flare. -Uric acid pending  DM type II controlled with renal complications/hypoglycemia -10/29 hemoglobin A1c= 5.2 Spouse reports he was admitted to the 11/29 several months ago and was told that he no longer had diabetes. He was having periodic episodes of hypoglycemia, likely related to  poor oral intake.  -11/10 very sensitive  SSI  Anemia of chronic disease:  Stable. Continue iron and B12 supplementations. Hb remains at 7.9 - 8.0 Lab Results  Component Value Date   HGB 8.7 (L) 10/11/2020   HGB 8.5 (L) 10/10/2020   HGB 7.0 (L) 10/10/2020   HGB 6.8 (LL) 10/10/2020   HGB 8.0 (L) 10/09/2020  -Transfuse for hemoglobin<7 -11/12 transfuse 1 unit PRBC  Malnutrition ST evaluated him during recent hospital admission and recommended dysphagia 1 diet.Requested repeat ST evaluation. -Marinol 5 mg BID  Open wound to heal [facility] RN consultation-see detailed note from 09/27/2019. Continue Prevalon boot.    DVT prophylaxis: Eliquis Code Status: Full Family Communication: 11/12 wife at bedside for discussion of plan of care answered all questions Status is: Inpatient    Dispo: The patient is from: Home              Anticipated d/c is to: Home              Anticipated d/c date is: 11/13 or 11/14              Patient currently unstable      Consultants:  CCS   Procedures/Significant Events:    I have personally reviewed and interpreted all radiology studies and my findings are as above.  VENTILATOR SETTINGS:    Cultures   Antimicrobials: Anti-infectives (From admission, onward)   None       Devices    LINES / TUBES:      Continuous Infusions: . sodium chloride 75 mL/hr at 10/10/20 2346     Objective: Vitals:   10/10/20 1953 10/11/20 0426 10/11/20 0500 10/11/20 0826  BP: 105/60 (!) 106/58  109/65  Pulse: 77 81  80  Resp: 16 16  14   Temp: 98.2 F (36.8 C) 97.9 F (36.6 C)  98.5 F (36.9 C)  TempSrc: Oral Oral  Oral  SpO2: 97% 98%  97%  Weight:   66.7 kg   Height:        Intake/Output Summary (Last 24 hours) at 10/11/2020 10/13/2020 Last data filed at 10/11/2020 0600 Gross per 24 hour  Intake 1717.76 ml  Output 750 ml  Net 967.76 ml   Filed Weights   10/09/20 0500 10/10/20 0500 10/11/20 0500  Weight: 76.5 kg 77 kg 66.7 kg    Examination:  General: A/O x4, No  acute respiratory distress Eyes: negative scleral hemorrhage, negative anisocoria, negative icterus ENT: Negative Runny nose, negative gingival bleeding, Neck:  Negative scars, masses, torticollis, lymphadenopathy, JVD Lungs: Clear to auscultation bilaterally without wheezes or crackles Cardiovascular: Regular rate and rhythm without murmur gallop or rub normal S1 and S2 Abdomen: negative abdominal pain, nondistended, positive soft, bowel sounds, no rebound, no ascites, no appreciable mass Extremities: No significant cyanosis, clubbing, or edema bilateral lower extremities Skin: Negative rashes, lesions, ulcers Psychiatric:  Negative depression, negative anxiety, negative fatigue, negative mania  Central nervous system:  Cranial nerves II through XII intact, tongue/uvula midline, all extremities muscle strength 5/5, sensation intact throughout, negative dysarthria, negative expressive aphasia, negative receptive aphasia.  .     Data Reviewed: Care during the described time interval was provided by me .  I have reviewed this patient's available data, including medical history, events of note, physical examination, and all test results as part of my evaluation.  CBC: Recent Labs  Lab 10/08/20 0557 10/08/20 0557 10/09/20 0141 10/10/20 0223 10/10/20 0623 10/10/20 2049 10/11/20 0507  WBC 8.6  --  7.9 6.4  --  8.1 8.1  NEUTROABS  --   --  5.1 4.3  --   --  5.3  HGB 7.8*   < > 8.0* 6.8* 7.0* 8.5* 8.7*  HCT 23.6*   < > 24.4* 20.9* 21.1* 25.2* 26.4*  MCV 97.9  --  98.4 99.5  --  95.8 97.1  PLT 293  --  259 251  --  291 256   < > = values in this interval not displayed.   Basic Metabolic Panel: Recent Labs  Lab 10/07/20 0038 10/08/20 0557 10/09/20 0141 10/10/20 0223 10/11/20 0507  NA 136 136 136 137 140  K 3.8 4.0 3.9 3.3* 3.4*  CL 99 100 101 103 108  CO2 26 26 26 25 24   GLUCOSE 88 102* 85 184* 105*  BUN 34* 38* 37* 34* 24*  CREATININE 1.83* 1.90* 1.87* 1.82* 1.39*  CALCIUM  8.8* 8.7* 8.5* 8.5* 8.4*  MG 1.8 1.7 1.8 1.8 2.1  PHOS 2.6 2.9 3.3 2.5 2.5   GFR: Estimated Creatinine Clearance: 37 mL/min (A) (by C-G formula based on SCr of 1.39 mg/dL (H)). Liver Function Tests: Recent Labs  Lab 10/06/20 0251 10/09/20 0141 10/10/20 0223 10/11/20 0507  AST 84* 74* 66* 67*  ALT 33 29 25 26   ALKPHOS 128* 112 97 93  BILITOT 1.2 1.4* 1.4* 2.1*  PROT 6.9 6.9 6.6 6.7  ALBUMIN 1.6* 1.6* 2.0* 2.0*   No results for input(s): LIPASE, AMYLASE in the last 168 hours. No results for input(s): AMMONIA in the last 168 hours. Coagulation Profile: No results for input(s): INR, PROTIME in the last 168 hours. Cardiac Enzymes: No results for input(s): CKTOTAL, CKMB, CKMBINDEX, TROPONINI in the last 168 hours. BNP (last 3 results) No results for input(s): PROBNP in the last 8760 hours. HbA1C: No results for input(s): HGBA1C in the last 72 hours. CBG: Recent Labs  Lab 10/10/20 1614 10/10/20 1952 10/11/20 0005 10/11/20 0426 10/11/20 0824  GLUCAP 116* 104* 127* 105* 101*   Lipid Profile: Recent Labs    10/10/20 0223  CHOL 71  HDL 10*  LDLCALC 49  TRIG 60  CHOLHDL 7.1   Thyroid Function Tests: Recent Labs    10/10/20 0223  TSH 1.640   Anemia Panel: No results for input(s): VITAMINB12, FOLATE, FERRITIN, TIBC, IRON, RETICCTPCT in the last 72 hours. Sepsis Labs: No results for input(s): PROCALCITON, LATICACIDVEN in the last 168 hours.  Recent Results (from the past 240 hour(s))  SARS Coronavirus 2 by RT PCR (hospital order, performed in [facility] hospital lab) Nasopharyngeal Nasopharyngeal Swab     Status: None   Collection Time: 10/08/20 11:06 AM   Specimen: Nasopharyngeal Swab  Result Value Ref Range Status   SARS Coronavirus 2 NEGATIVE NEGATIVE Final    Comment: (NOTE) SARS-CoV-2 target nucleic acids are NOT DETECTED.  The SARS-CoV-2 RNA is generally detectable in upper and lower respiratory specimens during the acute phase of infection. The  lowest concentration of SARS-CoV-2 viral copies this assay can detect is 250 copies / mL. A negative result does not preclude SARS-CoV-2 infection and should not be used as the sole basis for treatment or other patient management decisions.  A negative result may occur with improper specimen collection / handling, submission of specimen other than nasopharyngeal swab, presence of viral mutation(s) within the areas targeted by this assay, and inadequate number of viral copies (<250 copies / mL). A negative result must be combined with clinical observations, patient history, and epidemiological information.  Fact Sheet for Patients:   BoilerBrush.com.cy  Fact Sheet for Healthcare Providers: https://pope.com/  This test is not yet approved or  cleared by the [place] FDA and has been authorized for detection and/or diagnosis of SARS-CoV-2 by FDA under an Emergency Use Authorization (EUA).  This EUA will remain in effect (meaning this test can be used) for the duration of the COVID-19 declaration under Section 564(b)(1) of the Act, 21 U.S.C. section 360bbb-3(b)(1), unless the authorization is terminated or revoked sooner.  Performed at [facility], [address], [place], [place] [address]          Radiology Studies: No results found.      Scheduled Meds: . amiodarone  100 mg Oral Daily  . apixaban  2.5 mg Oral BID  . dronabinol  5 mg Oral BID AC  . feeding supplement  237 mL Oral TID BM  . finasteride  5 mg Oral BID  . insulin aspart  0-6 Units Subcutaneous Q4H  . iron polysaccharides  150 mg Oral Daily  . levothyroxine  125 mcg Oral QAC breakfast  . lidocaine  1 patch Transdermal Q24H  . multivitamin with minerals  1 tablet Oral Daily  . rosuvastatin  10 mg Oral q1800  . saccharomyces boulardii  250 mg Oral BID  . sertraline  25 mg Oral Daily  . spironolactone  12.5 mg Oral QHS  . cyanocobalamin  1,000  mcg Oral Daily   Continuous Infusions: . sodium chloride 75 mL/hr at 10/10/20 2346     LOS: 15 days    Time spent:40 min    Kimm Sider, Roselind Messier, MD [facility] Pager [phone]  If 7PM-7AM, please contact night-coverage [email or web address] Password [facility] 10/11/2020, 8:38 AM

## 2020-10-12 DIAGNOSIS — E7800 Pure hypercholesterolemia, unspecified: Secondary | ICD-10-CM

## 2020-10-12 DIAGNOSIS — N179 Acute kidney failure, unspecified: Secondary | ICD-10-CM | POA: Diagnosis not present

## 2020-10-12 DIAGNOSIS — I428 Other cardiomyopathies: Secondary | ICD-10-CM

## 2020-10-12 DIAGNOSIS — Z95810 Presence of automatic (implantable) cardiac defibrillator: Secondary | ICD-10-CM

## 2020-10-12 DIAGNOSIS — R1013 Epigastric pain: Principal | ICD-10-CM

## 2020-10-12 DIAGNOSIS — M1000 Idiopathic gout, unspecified site: Secondary | ICD-10-CM

## 2020-10-12 DIAGNOSIS — E43 Unspecified severe protein-calorie malnutrition: Secondary | ICD-10-CM

## 2020-10-12 DIAGNOSIS — E118 Type 2 diabetes mellitus with unspecified complications: Secondary | ICD-10-CM

## 2020-10-12 DIAGNOSIS — N183 Chronic kidney disease, stage 3 unspecified: Secondary | ICD-10-CM | POA: Diagnosis not present

## 2020-10-12 DIAGNOSIS — I10 Essential (primary) hypertension: Secondary | ICD-10-CM

## 2020-10-12 DIAGNOSIS — D638 Anemia in other chronic diseases classified elsewhere: Secondary | ICD-10-CM

## 2020-10-12 DIAGNOSIS — S91302A Unspecified open wound, left foot, initial encounter: Secondary | ICD-10-CM

## 2020-10-12 DIAGNOSIS — I480 Paroxysmal atrial fibrillation: Secondary | ICD-10-CM

## 2020-10-12 LAB — GLUCOSE, CAPILLARY
Glucose-Capillary: 102 mg/dL — ABNORMAL HIGH (ref 70–99)
Glucose-Capillary: 139 mg/dL — ABNORMAL HIGH (ref 70–99)
Glucose-Capillary: 95 mg/dL (ref 70–99)
Glucose-Capillary: 96 mg/dL (ref 70–99)

## 2020-10-12 LAB — CBC WITH DIFFERENTIAL/PLATELET
Abs Immature Granulocytes: 0.04 10*3/uL (ref 0.00–0.07)
Basophils Absolute: 0 10*3/uL (ref 0.0–0.1)
Basophils Relative: 0 %
Eosinophils Absolute: 0.1 10*3/uL (ref 0.0–0.5)
Eosinophils Relative: 1 %
HCT: 29.4 % — ABNORMAL LOW (ref 39.0–52.0)
Hemoglobin: 9.7 g/dL — ABNORMAL LOW (ref 13.0–17.0)
Immature Granulocytes: 0 %
Lymphocytes Relative: 14 %
Lymphs Abs: 1.4 10*3/uL (ref 0.7–4.0)
MCH: 32.2 pg (ref 26.0–34.0)
MCHC: 33 g/dL (ref 30.0–36.0)
MCV: 97.7 fL (ref 80.0–100.0)
Monocytes Absolute: 1.3 10*3/uL — ABNORMAL HIGH (ref 0.1–1.0)
Monocytes Relative: 13 %
Neutro Abs: 7.1 10*3/uL (ref 1.7–7.7)
Neutrophils Relative %: 72 %
Platelets: 302 10*3/uL (ref 150–400)
RBC: 3.01 MIL/uL — ABNORMAL LOW (ref 4.22–5.81)
RDW: 20.3 % — ABNORMAL HIGH (ref 11.5–15.5)
WBC: 10 10*3/uL (ref 4.0–10.5)
nRBC: 0 % (ref 0.0–0.2)

## 2020-10-12 LAB — COMPREHENSIVE METABOLIC PANEL
ALT: 28 U/L (ref 0–44)
AST: 71 U/L — ABNORMAL HIGH (ref 15–41)
Albumin: 2 g/dL — ABNORMAL LOW (ref 3.5–5.0)
Alkaline Phosphatase: 111 U/L (ref 38–126)
Anion gap: 7 (ref 5–15)
BUN: 21 mg/dL (ref 8–23)
CO2: 21 mmol/L — ABNORMAL LOW (ref 22–32)
Calcium: 8.7 mg/dL — ABNORMAL LOW (ref 8.9–10.3)
Chloride: 112 mmol/L — ABNORMAL HIGH (ref 98–111)
Creatinine, Ser: 1.26 mg/dL — ABNORMAL HIGH (ref 0.61–1.24)
GFR, Estimated: 56 mL/min — ABNORMAL LOW (ref >60)
Glucose, Bld: 120 mg/dL — ABNORMAL HIGH (ref 70–99)
Potassium: 4.8 mmol/L (ref 3.5–5.1)
Sodium: 140 mmol/L (ref 135–145)
Total Bilirubin: 1.9 mg/dL — ABNORMAL HIGH (ref 0.3–1.2)
Total Protein: 7.3 g/dL (ref 6.5–8.1)

## 2020-10-12 LAB — URIC ACID: Uric Acid, Serum: 5.9 mg/dL (ref 3.7–8.6)

## 2020-10-12 LAB — PHOSPHORUS: Phosphorus: 1.9 mg/dL — ABNORMAL LOW (ref 2.5–4.6)

## 2020-10-12 LAB — MAGNESIUM: Magnesium: 2.2 mg/dL (ref 1.7–2.4)

## 2020-10-12 MED ORDER — ONDANSETRON HCL 4 MG PO TABS: 4.0000 mg | ORAL_TABLET | Freq: Four times a day (QID) | ORAL | 0 refills | Status: AC | PRN

## 2020-10-12 MED ORDER — DRONABINOL 5 MG PO CAPS: 5.0000 mg | ORAL_CAPSULE | Freq: Two times a day (BID) | ORAL | 0 refills | Status: AC

## 2020-10-12 MED ORDER — AMIODARONE HCL 100 MG PO TABS
100.0000 mg | ORAL_TABLET | Freq: Every day | ORAL | 0 refills | Status: DC
Start: 2020-10-12 — End: 2020-11-15

## 2020-10-12 MED ORDER — TRAZODONE HCL 50 MG PO TABS: 25.0000 mg | ORAL_TABLET | Freq: Every evening | ORAL | 0 refills | Status: AC | PRN

## 2020-10-12 MED ORDER — SPIRONOLACTONE 25 MG PO TABS
12.5000 mg | ORAL_TABLET | Freq: Every day | ORAL | 0 refills | Status: DC
Start: 2020-10-12 — End: 2020-11-15

## 2020-10-12 NOTE — Progress Notes (Signed)
[facility] transported pt to [facility] [place]; Pt in stable condition.

## 2020-10-12 NOTE — Progress Notes (Signed)
Pt report given to Rosa,receiving RN for [facility] [place] ; awaiting  PTAR for pickup.

## 2020-10-12 NOTE — TOC Transition Note (Signed)
Transition of Care [facility]) - CM/SW Discharge Note   Patient Details  Name: Ryan Macdonald MRN: [id number] Date of Birth: 1934-11-22  Transition of Care [facility]) CM/SW Contact:  Jimmy Picket, [place] Phone Number: 10/12/2020, 12:17 PM   Clinical Narrative:     Pt will discharge to [facility] [place] via ptar. Pts wife [name] has been notified of transfer. Pts most recent covid test is negative. [facility]tar has been requested for 1:30pm.    Nurse to call report to [phone].  TOC will sign off. Please re consult if other needs arise.   Final next level of care: Skilled Nursing Facility Barriers to Discharge: Barriers Resolved   Patient Goals and CMS Choice Patient states their goals for this hospitalization and ongoing recovery are:: to get better CMS Medicare.gov Compare Post Acute Care list provided to:: Patient Choice offered to / list presented to : Patient  Discharge Placement              Patient chooses bed at: [facility] Patient to be transferred to facility by: [facility] Name of family member notified: Wife, [name] Patient and family notified of of transfer: 10/12/20  Discharge Plan and Services                                     Social Determinants of Health (SDOH) Interventions     Readmission Risk Interventions Readmission Risk Prevention Plan 09/25/2020  Transportation Screening Complete  HRI or Home Care Consult Complete  Social Work Consult for Recovery Care Planning/Counseling Complete  Palliative Care Screening Not Applicable  Medication Review [employer]) Complete  Some recent data might be hidden   Jimmy Picket, Theresia Majors, [facility] Clinical Social Worker [phone]

## 2020-10-12 NOTE — Discharge Summary (Signed)
Physician Discharge Summary  Ryan Macdonald ZOX:[id number] DOB: 1934-10-15 DOA: 09/26/2020  PCP: [facility]  Admit date: 09/26/2020 Discharge date: 10/12/2020  Time spent: 35 minutes  Recommendations for Outpatient Follow-up:   Abdominal pain, unclear etiology: >> Resolved. -He presented with abdominal pain, initial lipase 116, AST 138.  -Abdominal CT nonrevealing for cause.  -Abdominal ultrasound showed possible thickening of the gallbladder. General surgery consulted and clinically not cholecystitis. -HIDA scan negative. General surgery signed off.  -Hepatitis profile negative.Not eating much, encouraged oral intake. -no complaint of abdominal pain today  Chronic systolic CHF/nonischemic cardiomyopathy LVEF 20-25% by last echo in September 2021.  He has implantable AICD.  -Clinically now euvolemic -Strict in and out  +355.71ml -Daily weight Filed Weights   10/09/20 0500 10/10/20 0500 10/11/20 0500  Weight: 76.5 kg 77 kg 66.7 kg  -Schedule follow-up appointment with Dr. [name] cardiology in 2 to 3 weeks chronic systolic CHF, nonischemic cardiomyopathy. -SNF to weigh patient daily and report findings to [facility] cardiology for instructions on medication adjustment  Essential hypertension/Hypotension -11/11 decreased Amiodarone 100 mg daily; secondary to hypotension allow PCP/cardiologist to determine if patient needs to have medication increased. -Spironolactone 12.5 mg daily -11/11 Albumin 50 g + bolus normal saline 537ml@100ml /hr -Soft but improved -patient to weigh himself daily use discharge weight as base weight -Schedule follow-up appointment with [facility] in 2 weeks chronic systolic CHF, nonischemic cardiomyopathy, paroxysmal A. fib, acute on CKD stage III  Paroxysmal atrial fibrillation/AICD. -See essential HTN -Apixaban 2.5 mg BID -Patient NSR  Hyperlipidemia -Crestor 10 mg daily   Hypokalemia -Potassium goal>  4  Hypomagnesmia -Magnesium goal> 2   Acute on CKD stage IIIa (BaselineCr 1.4) -Although patient's creatinine tends to fluctuate has continued to trend up now at 1.9 -11/10 Torsemide 40 mg daily (hold) will allow PCP to determine if/when patient should restart Lab Results  Component Value Date   CREATININE 1.26 (H) 10/12/2020   CREATININE 1.39 (H) 10/11/2020   CREATININE 1.82 (H) 10/10/2020   CREATININE 1.87 (H) 10/09/2020   CREATININE 1.90 (H) 10/08/2020  -Better than baseline  Gout -Restart Allopurinol on discharge. -11/14 uric acid = 5.9.  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 He was having periodic episodes of hypoglycemia, likely related to poor oral intake.  -11/10 very sensitive SSI  Anemia of chronic disease:  Stable. Continue iron and B12 supplementations. Hb remains at 7.9 - 8.0 Lab Results  Component Value Date   HGB 9.7 (L) 10/12/2020   HGB 8.7 (L) 10/11/2020   HGB 8.5 (L) 10/10/2020   HGB 7.0 (L) 10/10/2020   HGB 6.8 (LL) 10/10/2020   -Transfuse for hemoglobin<7 -11/12 transfuse 1 unit PRBC  Malnutrition ST evaluated him during recent hospital admission and recommended dysphagia 1 diet.Requested repeat ST evaluation. -Marinol 5 mg BID  Open wound to heal WOCN RN consultation-see detailed note from 09/27/2019. Continue Prevalon boot.   Discharge Diagnoses:  Principal Problem:   Pancreatitis Active Problems:   Nonischemic cardiomyopathy (HCC)   Essential hypertension   Hyperlipidemia   PAF (paroxysmal atrial fibrillation) (HCC)   Hyperkalemia   Chronic renal insufficiency, stage 3 (moderate) (HCC)   ICD (implantable cardioverter-defibrillator) in place   Gout   Diabetes mellitus type 2 in obese (HCC)   Acute renal failure superimposed on stage 3  chronic kidney  disease (HCC)   Transaminitis   Anemia of chronic disease   Protein-calorie malnutrition, severe (HCC)   Abdominal pain   Open wound of left heel   Diabetes mellitus type 2, controlled, with complications [facility])   Discharge Condition: Stable  Diet recommendation: Dysphagia 3 fluid consistency thin  Filed Weights   10/09/20 0500 10/10/20 0500 10/11/20 0500  Weight: 76.5 kg 77 kg 66.7 kg    History of present illness:  86 year old BM PMHx Chronic Systolic CHF (LVEF 20-25%, AICD in place), paroxysmal A. fib on amiodarone and Eliquis, HTN, CKD stage IIIb HLD, Gout, Hypothyroidism, recent sepsis related to UTI, discharged home on 10/28 following admission for presumed UTI,   Presented with abdominal pain. In the ED, mildly elevated AST/138 and lipase 116, CT abdomen showed right greater than left pleural effusion, periportal edema, renal stones, gastric varices, right hernia and bladder distention. Admitted for presumed pancreatitis-felt less likely. Course complicated by AKI. Labs improved, Abd. Pain resolved. PT recommended SNF, awaiting insurance authorization.  Hospital Course:  See above   Consultations: [facility]  Cultures   10/29 SARS coronavirus negative 11/2 acute hepatitis panel negative 11/10 SARS coronavirus negative  11/13 SARS coronavirus negative  Antibiotics Anti-infectives (From admission, onward)   None       Discharge Exam: Vitals:   10/11/20 0826 10/11/20 1931 10/12/20 0348 10/12/20 0827  BP: 109/65 117/66 111/68 99/63  Pulse: 80 81 98 85  Resp: Temp: 98.5 F (36.9 C) 97.8 F (36.6 C) (!) 97.5 F (36.4 C) 98.4 F (36.9 C)  TempSrc: Oral Oral Oral Oral  SpO2: 97% 98% 97% 98%  Weight:      Height:        General: A/O x4, No acute respiratory distress Eyes: negative scleral hemorrhage, negative anisocoria, negative icterus ENT: Negative Runny nose, negative gingival bleeding, Neck:  Negative scars, masses, torticollis,  lymphadenopathy, JVD Lungs: Clear to auscultation bilaterally without wheezes or crackles Cardiovascular: Regular rate and rhythm without murmur gallop or rub normal S1 and S2   Discharge Instructions   Allergies as of 10/12/2020      Reactions   Lisinopril Swelling   Facial swelling   Xarelto [rivaroxaban] Other (See Comments)   "Blood came out of my penis"      Medication List    STOP taking these medications   senna-docusate 8.6-50 MG tablet Commonly known as: Senokot-S   torsemide 20 MG tablet Commonly known as: DEMADEX     TAKE these medications   acetaminophen 325 MG tablet Commonly known as: TYLENOL Take 650 mg by mouth every 6 (six) hours as needed.   allopurinol 100 MG tablet Commonly known as: ZYLOPRIM Take 200 mg by mouth daily.   amiodarone 100 MG tablet Commonly known as: PACERONE Take 1 tablet (100 mg total) by mouth daily. What changed:   medication strength  how much to take   apixaban 2.5 MG Tabs tablet Commonly known as: ELIQUIS Take 1 tablet (2.5 mg total) by mouth 2 (two) times daily.   benzonatate 100 MG capsule Commonly known as: TESSALON Take 1 capsule (100 mg total) by mouth 3 (three) times daily as needed for cough.   cyanocobalamin 1000 MCG tablet Take 1 tablet (1,000 mcg total) by mouth daily.   diclofenac Sodium 1 % Gel Commonly known as: VOLTAREN Apply 4 g topically 4 (four) times daily. What changed:   when to take this  reasons to take this   dronabinol  5 MG capsule Commonly known as: MARINOL Take 1 capsule (5 mg total) by mouth 2 (two) times daily before lunch and supper.   feeding supplement Liqd Take 237 mLs by mouth 2 (two) times daily between meals.   finasteride 5 MG tablet Commonly known as: PROSCAR Take 1 tablet (5 mg total) by mouth daily. What changed: when to take this   HYDROcodone-acetaminophen 5-325 MG tablet Commonly known as: NORCO/VICODIN Take 1 tablet by mouth 3 (three) times daily as needed  for severe pain.   ipratropium-albuterol 0.5-2.5 (3) MG/3ML Soln Commonly known as: DUONEB Take 3 mLs by nebulization every 4 (four) hours as needed.   iron polysaccharides 150 MG capsule Commonly known as: NIFEREX Take 1 capsule (150 mg total) by mouth daily.   levothyroxine 125 MCG tablet Commonly known as: SYNTHROID Take 1 tablet (125 mcg total) by mouth daily before breakfast.   lidocaine 5 % Commonly known as: LIDODERM Place 1 patch onto the skin daily. Remove & Discard patch within 12 hours or as directed by MD   magnesium oxide 400 MG tablet Commonly known as: MAG-OX Take 400 mg by mouth daily.   multivitamin with minerals Tabs tablet Take 1 tablet by mouth daily.   ondansetron 4 MG tablet Commonly known as: ZOFRAN Take 1 tablet (4 mg total) by mouth every 6 (six) hours as needed for nausea.   polyethylene glycol 17 g packet Commonly known as: MIRALAX / GLYCOLAX Take 17 g by mouth 2 (two) times daily.   rosuvastatin 10 MG tablet Commonly known as: CRESTOR Take 1 tablet (10 mg total) by mouth daily at 6 PM.   saccharomyces boulardii 250 MG capsule Commonly known as: FLORASTOR Take 1 capsule (250 mg total) by mouth 2 (two) times daily.   sertraline 25 MG tablet Commonly known as: ZOLOFT Take 25 mg by mouth daily.   spironolactone 25 MG tablet Commonly known as: ALDACTONE Take 0.5 tablets (12.5 mg total) by mouth at bedtime.   traZODone 50 MG tablet Commonly known as: DESYREL Take 0.5 tablets (25 mg total) by mouth at bedtime as needed for sleep.      Allergies  Allergen Reactions  . Lisinopril Swelling    Facial swelling  . Xarelto [Rivaroxaban] Other (See Comments)    "Blood came out of my penis"    Contact information for follow-up providers    [facility] Follow up in 2 week(s).   Specialty: General Practice Why: Schedule follow-up appointment with [facility] in 2 weeks chronic systolic CHF, nonischemic  cardiomyopathy, paroxysmal A. fib, acute on CKD stage III  Contact information: [address] [place] [address] [phone]        [name], MD .   Specialty: Cardiology Contact information: [address] [place] [address] [place] [address] [phone]        Laurey Morale, MD .   Specialty: Cardiology Contact information: [address] [place] [address] [phone]            Contact information for after-discharge care    Destination    [facility] Preferred SNF .   Service: Skilled Nursing Contact information: [address] [place] [address] [phone]                   The results of significant diagnostics from this hospitalization (including imaging, microbiology, ancillary and laboratory) are listed below for reference.    Significant Diagnostic Studies: DG Chest  2 View  Result Date: 09/20/2020 CLINICAL DATA:  Altered mental status EXAM: CHEST - 2 VIEW COMPARISON:  08/16/2020 FINDINGS: LEFT-sided pacemaker overlies normal cardiac silhouette. Small RIGHT effusion. No pulmonary edema. No infiltrate. No pneumothorax. IMPRESSION: Small RIGHT effusion.  No acute cardiopulmonary findings otherwise. Electronically Signed   By: Genevive Bi M.D.   On: 09/20/2020 15:41   CT Head Wo Contrast  Result Date: 09/20/2020 CLINICAL DATA:  Altered mental status for a month. EXAM: CT HEAD WITHOUT CONTRAST TECHNIQUE: Contiguous axial images were obtained from the base of the skull through the vertex without intravenous contrast. COMPARISON:  08/16/2020 FINDINGS: Brain: No evidence of acute infarction, hemorrhage, hydrocephalus, extra-axial collection or mass lesion/mass effect. Diffuse cerebral atrophy. Ventricular dilatation is consistent with central atrophy. Patchy low-attenuation changes in the deep white matter consistent with small vessel ischemia. Vascular: Moderate intracranial arterial vascular calcifications.  Skull: Calvarium appears intact. Sinuses/Orbits: Paranasal sinuses and mastoid air cells are clear. Other: No significant change since previous study. IMPRESSION: 1. No acute intracranial abnormalities. 2. Chronic atrophy and small vessel ischemia. Electronically Signed   By: Burman Nieves M.D.   On: 09/20/2020 19:27   NM Hepatobiliary Liver Func  Result Date: 09/29/2020 CLINICAL DATA:  Chronic upper abdominal pain, abnormal ultrasound with thickened wall question cholecystitis, no fever or leukocytosis EXAM: NUCLEAR MEDICINE HEPATOBILIARY IMAGING TECHNIQUE: Sequential images of the abdomen were obtained out to 60 minutes following intravenous administration of radiopharmaceutical. RADIOPHARMACEUTICALS:  5.3 mCi Tc-77m  Choletec IV COMPARISON:  None FINDINGS: Slightly delayed tracer extraction from bloodstream, with blood pool seen through 20 minutes. Excretion of tracer into biliary tree. Gallbladder visualized at 20 minutes. Small bowel visualized at 12 minutes. No definite hepatic retention of tracer. Patient received morphine following performance of the initial biliary imaging, precluding assessment of gallbladder motility by fatty meal stimulation. IMPRESSION: Patent biliary tree. No scintigraphic evidence of cholecystitis. Delayed clearance of tracer from bloodstream indicating hepatocellular dysfunction; recommend correlation with LFTs. Electronically Signed   By: Ulyses Southward M.D.   On: 09/29/2020 15:57   US Abdomen Complete  Result Date: 09/26/2020 CLINICAL DATA:  Abdominal pain, pancreatitis. EXAM: ABDOMEN ULTRASOUND COMPLETE COMPARISON:  None. FINDINGS: Gallbladder: No gallstones. There is diffuse gallbladder wall thickening. No sonographic Murphy sign noted by sonographer. Common bile duct: Diameter: 5.6 mm, within normal limits. Liver: No focal lesion identified. Within normal limits in parenchymal echogenicity. Portal vein is patent on color Doppler imaging with normal direction of blood  flow towards the liver. IVC: No abnormality visualized. Pancreas: Limited visualization secondary to overlying bowel gas. Spleen: Size and appearance within normal limits. Right Kidney: Length: 10.5 cm in length. Echogenicity within normal limits. No hydronephrosis. Multiple calculi, the largest measuring 2.1 cm. Left Kidney: Length: 11.9 cm in length. Echogenicity within normal limits. No hydronephrosis. There are 2 cysts, the largest measuring 2.0 cm in the interpolar lateral kidney. Abdominal aorta: No aneurysm visualized. Other findings: None. IMPRESSION: 1. Diffuse gallbladder wall thickening, which may be reactive or relate to hepatic dysfunction or congestive heart failure/volume status given no other evidence of acute cholecystitis (negative Murphy sign and no cholelithiasis). If there is clinical concern for chronic cholecystitis, HIDA scan could further evaluate. 2. Right nephrolithiasis. 3. Limited visualization of the pancreas. Electronically Signed   By: Feliberto Harts MD   On: 09/26/2020 10:15   CT ABDOMEN PELVIS W CONTRAST  Result Date: 09/26/2020 CLINICAL DATA:  Acute, nonlocalized abdominal pain EXAM: CT ABDOMEN AND PELVIS WITH CONTRAST TECHNIQUE: Multidetector CT imaging  of the abdomen and pelvis was performed using the standard protocol following bolus administration of intravenous contrast. CONTRAST:  OMNIPAQUE IOHEXOL 300 MG/ML  SOLN COMPARISON:  03/08/2019 FINDINGS: Lower chest: Large right and small left pleural effusions with multi segment atelectasis. The left ventricle may be dilated. Aortic and coronary atherosclerosis. Hepatobiliary: There may be periportal edema. No overt cirrhotic changes.Full gallbladder without root still fat stranding or calcified choledocholithiasis, unchanged. Pancreas: No focal peripancreatic edema or definite expansion. Spleen: Negative Adrenals/Urinary Tract: Negative adrenals. Numerous and branching right renal calculi with severe right renal  cortical atrophy. No hydronephrosis or renal inflammation seen. Chronic large upward bladder diverticulum which partially contains gas today. Bladder is distended to a moderate degree. No detected bladder wall thickening. Stomach/Bowel: No obstruction. No visible bowel inflammation. Short gastric varices are seen. The major portal venous system appears patent. Vascular/Lymphatic: No acute vascular abnormality. Extensive atherosclerosis. No mass or adenopathy. Mild colonic diverticulosis. Reproductive:The prostate region is obscured by streak artifact, there may have been prior prostatectomy. Other: No ascites or pneumoperitoneum. Right lateral hernia which may be postsurgical. There is shallow bulging right-sided colon, as before. Mild retroperitoneal edema, nonspecific. Musculoskeletal: Advanced lumbar spine degeneration. Left hip arthroplasty. IMPRESSION: 1. Large right and small left pleural effusion with multi segment atelectasis. 2. Short gastric varices. 3. Severe right renal atrophy with extensive right nephrolithiasis. 4. Distended bladder with bladder diverticulum. 5. Right posterolateral abdominal wall hernia containing nonobstructive bowel. Electronically Signed   By: Marnee Spring M.D.   On: 09/26/2020 05:50   US RENAL  Result Date: 09/21/2020 CLINICAL DATA:  Acute kidney injury EXAM: RENAL / URINARY TRACT ULTRASOUND COMPLETE COMPARISON:  None. FINDINGS: Right Kidney: Renal measurements: 6.7 x 3.9 x 4.3 cm = volume: 59 mL. Slightly increased echogenicity is seen. There is an anechoic cyst seen within the lower pole measuring 0.9 x 0.9 x 0.9 cm. There are shadowing calculi seen within the midpole measuring up to 1.7 cm in transverse dimension. No hydronephrosis. Left Kidney: Renal measurements: 11.9 x 6.4 x 5.6 cm = volume: 226 mL. Increased echogenicity seen throughout. There are 2 anechoic cysts the largest measuring 2.1 x 1.7 x 2.2 cm. There are shadowing calculi measuring 6 mm seen within the  midpole. No hydronephrosis. Bladder: Layering debris seen within the bladder. Other: None. IMPRESSION: Nonobstructing bilateral renal calculi. Diffusely increased parenchymal echogenicity, consistent with medical renal disease. Electronically Signed   By: Jonna Clark M.D.   On: 09/21/2020 00:15   CUP PACEART REMOTE DEVICE CHECK  Result Date: 09/20/2020 Scheduled remote reviewed. Normal device function.  Next remote 91 days.   Microbiology: Recent Results (from the past 240 hour(s))  SARS Coronavirus 2 by RT PCR (hospital order, performed in [facility] hospital lab) Nasopharyngeal Nasopharyngeal Swab     Status: None   Collection Time: 10/08/20 11:06 AM   Specimen: Nasopharyngeal Swab  Result Value Ref Range Status   SARS Coronavirus 2 NEGATIVE NEGATIVE Final    Comment: (NOTE) SARS-CoV-2 target nucleic acids are NOT DETECTED.  The SARS-CoV-2 RNA is generally detectable in upper and lower respiratory specimens during the acute phase of infection. The lowest concentration of SARS-CoV-2 viral copies this assay can detect is 250 copies / mL. A negative result does not preclude SARS-CoV-2 infection and should not be used as the sole basis for treatment or other patient management decisions.  A negative result may occur with improper specimen collection / handling, submission of specimen other than nasopharyngeal swab, presence of viral mutation(s)  within the areas targeted by this assay, and inadequate number of viral copies (<250 copies / mL). A negative result must be combined with clinical observations, patient history, and epidemiological information.  Fact Sheet for Patients:   BoilerBrush.com.cy  Fact Sheet for Healthcare Providers: https://pope.com/  This test is not yet approved or  cleared by the [place] FDA and has been authorized for detection and/or diagnosis of SARS-CoV-2 by FDA under an Emergency Use Authorization  (EUA).  This EUA will remain in effect (meaning this test can be used) for the duration of the COVID-19 declaration under Section 564(b)(1) of the Act, 21 U.S.C. section 360bbb-3(b)(1), unless the authorization is terminated or revoked sooner.  Performed at [facility], [address], [place], [place] [address]   SARS Coronavirus 2 by RT PCR (hospital order, performed in [facility] hospital lab) Nasopharyngeal Nasopharyngeal Swab     Status: None   Collection Time: 10/11/20 10:08 AM   Specimen: Nasopharyngeal Swab  Result Value Ref Range Status   SARS Coronavirus 2 NEGATIVE NEGATIVE Final    Comment: (NOTE) SARS-CoV-2 target nucleic acids are NOT DETECTED.  The SARS-CoV-2 RNA is generally detectable in upper and lower respiratory specimens during the acute phase of infection. The lowest concentration of SARS-CoV-2 viral copies this assay can detect is 250 copies / mL. A negative result does not preclude SARS-CoV-2 infection and should not be used as the sole basis for treatment or other patient management decisions.  A negative result may occur with improper specimen collection / handling, submission of specimen other than nasopharyngeal swab, presence of viral mutation(s) within the areas targeted by this assay, and inadequate number of viral copies (<250 copies / mL). A negative result must be combined with clinical observations, patient history, and epidemiological information.  Fact Sheet for Patients:   BoilerBrush.com.cy  Fact Sheet for Healthcare Providers: https://pope.com/  This test is not yet approved or  cleared by the [place] FDA and has been authorized for detection and/or diagnosis of SARS-CoV-2 by FDA under an Emergency Use Authorization (EUA).  This EUA will remain in effect (meaning this test can be used) for the duration of the COVID-19 declaration under Section 564(b)(1) of the Act, 21  U.S.C. section 360bbb-3(b)(1), unless the authorization is terminated or revoked sooner.  Performed at [facility], [address] [place], [place] [address]      Labs: Basic Metabolic Panel: Recent Labs  Lab 10/08/20 0557 10/08/20 0557 10/09/20 0141 10/10/20 0223 10/11/20 0507 10/11/20 1520 10/12/20 0252  NA 136  --  136 137 140  --  140  K 4.0   < > 3.9 3.3* 3.4* 5.2* 4.8  CL 100  --  101 103 108  --  112*  CO2 26  --  --  21*  GLUCOSE 102*  --  85 184* 105*  --  120*  BUN 38*  --  37* 34* 24*  --  21  CREATININE 1.90*  --  1.87* 1.82* 1.39*  --  1.26*  CALCIUM 8.7*  --  8.5* 8.5* 8.4*  --  8.7*  MG 1.7   < > 1.8 1.8 2.1 2.2 2.2  PHOS 2.9  --  3.3 2.5 2.5  --  1.9*   < > = values in this interval not displayed.   Liver Function Tests: Recent Labs  Lab 10/06/20 0251 10/09/20 0141 10/10/20 0223 10/11/20 0507 10/12/20 0252  AST 84* 74* 66* 67*  71*  ALT 33 29 25 26 28   ALKPHOS 128* 112 97 93 111  BILITOT 1.2 1.4* 1.4* 2.1* 1.9*  PROT 6.9 6.9 6.6 6.7 7.3  ALBUMIN 1.6* 1.6* 2.0* 2.0* 2.0*   No results for input(s): LIPASE, AMYLASE in the last 168 hours. No results for input(s): AMMONIA in the last 168 hours. CBC: Recent Labs  Lab 10/09/20 0141 10/09/20 0141 10/10/20 0223 10/10/20 0623 10/10/20 2049 10/11/20 0507 10/12/20 0252  WBC 7.9  --  6.4  --  8.1 8.1 10.0  NEUTROABS 5.1  --  4.3  --   --  5.3 7.1  HGB 8.0*   < > 6.8* 7.0* 8.5* 8.7* 9.7*  HCT 24.4*   < > 20.9* 21.1* 25.2* 26.4* 29.4*  MCV 98.4  --  99.5  --  95.8 97.1 97.7  PLT 259  --  251  --  291 256 302   < > = values in this interval not displayed.   Cardiac Enzymes: No results for input(s): CKTOTAL, CKMB, CKMBINDEX, TROPONINI in the last 168 hours. BNP: BNP (last 3 results) Recent Labs    02/11/20 1739 08/17/20 0716 09/26/20 0957  BNP 1,569.0* 2,349.4* 775.9*    ProBNP (last 3 results) No results for input(s): PROBNP in the last 8760 hours.  CBG: Recent Labs   Lab 10/11/20 1610 10/11/20 1932 10/12/20 0025 10/12/20 0350 10/12/20 0823  GLUCAP 110* 93 139* 102* 96       Signed:  10/14/20, MD [facility] [phone] pager

## 2020-10-13 NOTE — Progress Notes (Signed)
Date:  10/14/2020   ID:  Ryan Macdonald, DOB 1934-01-18, MRN [id number]   Provider location: [facility] Type of Visit: Established patient   PCP:  [facility]; Dr. Tereasa Coop  Cardiologist:  Dr. Shirlee Latch   History of Present Illness: Ryan Macdonald is a 86 y.o. male who has a history ofsystolic HF due toNICM, Biotronic ICD, A Fibon Eliquis, DMII, HTN,CAD s/p pLAD stent in 07/2018 with residual non-obstructive disease,chronic hypoxic respiratory failure on home O2 PRN,and depression.  Admitted 12/26 - 11/28/18 with A/C systolic CHF. Diuresed 21 lbs and meds adjusted as tolerated. Echo showed EF 20-25%. EP was consulted for RV pacing and ICD settings were adjusted => patient was reprogrammed to VDD, now with narrow QRS with pacing.   Patient was admitted in 3/21 with CHF, AKI and E coli UTI.  He had a prolonged admission, was diuresed on milrinone gtt + Lasix gtt.  Amiodarone was restarted to keep him in NSR.   Echo in 4/21 showed EF 25% with moderate LV dilation and inferior/inferolateral AK; mildly decreased RV systolic function; moderate MR.   Admitted 09/26/20 with abdominal pain. On 10/08/20 torsemide was on hold due to elevated creatinine. Torsemide was discontinued at discharge. Received transfusion for hemoglobin < 7. Discharged 10/12/20.at [facility]   Today he returns for HF follow up. He arrived on a stretcher with PTAR. His wife joined him for the visit. Overall feeling fair. He has not been walking much. His wife says he is limited by pressure ulcers on sacrum and left heal. . Denies SOB/PND/Orthopnea. Appetite poor. No fever or chills. Weight at at [facility] 188 but this was in a chair. Taking all medications provided at SNF.    Labs (4/20): K 4.1, creatinine 2.23, LDL 58 Labs (6/20): TSH 43, K 4.6, creatinine 1.67 Labs (8/20): K 5.1, creatinine 1.66 Labs (10/20): K 4.9 => 5, creatinine 1.89 => 1.5, LDL 65, HDL 32, hgb  10.4 Labs (3/21): K 3.7, creatinine 2 Labs (4/21): K 4.2, creatinine 1.72, TSH normal Labs (9/21): K 4.0, creatinine 1.99, TSH normal, hgb 8.7   Labs (10/12/20): K 4.8 Creatinine 1.3   PMH: 1. Atrial fibrillation: Paroxysmal. 2. CKD: Stage 3. 3. Type 2 diabetes 4. Hyperlipidemia 5. HTN 6. Nephrolithiasis 7. ?NAFLD 8. Gout  9. CAD: LHC (9/19) with 40% distal LM, 50% ostial LAD, 50% mid LAD, 75% proximal LAD treated with DES.  10. Chronic hypoxemic respiratory failure. Has home oxygen. ?COPD: Prior smoker.  11. CHF: EF low since 2014. Primarily nonischemic cardiomyopathy. Biotronik ICD. - Echo (12/19) with EF 20-25%, diffuse hypokinesis, moderate MR, mildly dilated RV with moderately decreased systolic function, PASP 39 mmHg.  - ACEI angioedema.  - Echo (4/21): EF 25% with moderate LV dilation and inferior/inferolateral AK; mildly decreased RV systolic function; moderate MR.  - Echo (07/2020): EF 20-25% RV mildly reduced.  12. Hypothyroidism 13. Fe deficiency anemia.    Current Outpatient Medications  Medication Sig Dispense Refill  . acetaminophen (TYLENOL) 325 MG tablet Take 650 mg by mouth every 6 (six) hours as needed.    Marland Kitchen allopurinol (ZYLOPRIM) 100 MG tablet Take 200 mg by mouth daily.     Marland Kitchen amiodarone (PACERONE) 100 MG tablet Take 1 tablet (100 mg total) by mouth daily. 30 tablet 0  . apixaban (ELIQUIS) 2.5 MG TABS tablet Take 1 tablet (2.5 mg total) by mouth 2 (two) times daily.    . Ascorbic Acid (VITAMIN C)  500 MG CAPS Take 500 mg by mouth in the morning and at bedtime.    Marland Kitchen atorvastatin (LIPITOR) 20 MG tablet Take 20 mg by mouth daily.    . benzonatate (TESSALON) 100 MG capsule Take 1 capsule (100 mg total) by mouth 3 (three) times daily as needed for cough. 20 capsule 0  . cyanocobalamin 1000 MCG tablet Take 1 tablet (1,000 mcg total) by mouth daily. 90 tablet 0  . dronabinol (MARINOL) 5 MG capsule Take 1 capsule (5 mg total) by mouth 2 (two) times daily before lunch and  supper. 60 capsule 0  . feeding supplement (ENSURE ENLIVE / ENSURE PLUS) LIQD Take 237 mLs by mouth 2 (two) times daily between meals.    . finasteride (PROSCAR) 5 MG tablet Take 1 tablet (5 mg total) by mouth daily. (Patient taking differently: Take 5 mg by mouth in the morning and at bedtime. ) 30 tablet 0  . HYDROcodone-acetaminophen (NORCO/VICODIN) 5-325 MG tablet Take 1 tablet by mouth 3 (three) times daily as needed for severe pain. 15 tablet 0  . levothyroxine (SYNTHROID) 125 MCG tablet Take 1 tablet (125 mcg total) by mouth daily before breakfast. 90 tablet 1  . lidocaine (LIDODERM) 5 % Place 1 patch onto the skin daily. Remove & Discard patch within 12 hours or as directed by MD 30 patch 0  . magnesium oxide (MAG-OX) 400 MG tablet Take 400 mg by mouth daily.     . Multiple Vitamin (MULTIVITAMIN WITH MINERALS) TABS tablet Take 1 tablet by mouth daily.    . ondansetron (ZOFRAN) 4 MG tablet Take 1 tablet (4 mg total) by mouth every 6 (six) hours as needed for nausea. 20 tablet 0  . polyethylene glycol (MIRALAX / GLYCOLAX) 17 g packet Take 17 g by mouth 2 (two) times daily. 14 each 0  . rosuvastatin (CRESTOR) 10 MG tablet Take 1 tablet (10 mg total) by mouth daily at 6 PM. 30 tablet 5  . saccharomyces boulardii (FLORASTOR) 250 MG capsule Take 1 capsule (250 mg total) by mouth 2 (two) times daily.    . sertraline (ZOLOFT) 25 MG tablet Take 25 mg by mouth daily.     Marland Kitchen spironolactone (ALDACTONE) 25 MG tablet Take 0.5 tablets (12.5 mg total) by mouth at bedtime. 30 tablet 0  . traZODone (DESYREL) 50 MG tablet Take 0.5 tablets (25 mg total) by mouth at bedtime as needed for sleep. 30 tablet 0  . Zinc 50 MG CAPS Take 50 mg by mouth daily.     No current facility-administered medications for this encounter.    Allergies:   Lisinopril and Xarelto [rivaroxaban]   Social History:  The patient  reports that he has quit smoking. His smoking use included cigarettes and cigars. He has a 15.00 pack-year  smoking history. He has quit using smokeless tobacco. He reports that he does not drink alcohol and does not use drugs.   Family History:  The patient's family history includes Heart Problems in his maternal grandmother; Heart disease in his mother; Other in an other family member.   ROS:  Please see the history of present illness.   All other systems are personally reviewed and negative.   Vitals:  BP 108/65   Pulse 76   Wt 85.3 kg (188 lb)   SpO2 97%   BMI 29.44 kg/m   Wt Readings from Last 3 Encounters:  10/14/20 85.3 kg (188 lb)  10/11/20 66.7 kg (147 lb 0.8 oz)  09/25/20 75.3 kg (166  lb)  Unable to weigh today he is on a stretcher. The weight provided is from the SNF.    PHYSICAL EXAM: General:  Appears frail. Arrived on stretcher with EMS HEENT: normal Neck: supple. JVP 9-10  Carotids 2+ bilat; no bruits. No lymphadenopathy or thryomegaly appreciated. Cor: PMI nondisplaced. Regular rate & rhythm. No rubs, gallops or murmurs. Lungs: clear Abdomen: soft, nontender, nondistended. No hepatosplenomegaly. No bruits or masses. Good bowel sounds. Extremities: no cyanosis, clubbing, rash, edema. R and LLE heel boots.  Neuro: alert & orientedx3, cranial nerves grossly intact. moves all 4 extremities w/o difficulty. Affect flat    Recent Labs: 09/26/2020: B Natriuretic Peptide 775.9 10/10/2020: TSH 1.640 10/12/2020: ALT 28; BUN 21; Creatinine, Ser 1.26; Hemoglobin 9.7; Magnesium 2.2; Platelets 302; Potassium 4.8; Sodium 140  Personally reviewed   Wt Readings from Last 3 Encounters:  10/14/20 85.3 kg (188 lb)  10/11/20 66.7 kg (147 lb 0.8 oz)  09/25/20 75.3 kg (166 lb)    ASSESSMENT AND PLAN:  1. Chronic systolic CHF: Primarily nonischemic cardiomyopathy. EF has been low since at least 2014.  He has single chamber Biotronik ICD. QRS not wide enough for CRT.Settings adjusted in the past to prevent RV pacing in the face of a long 1st degree AV block.  Echo in 4/21 showed EF 25%  with moderate LV dilation and inferior/inferolateral AK; mildly decreased RV systolic function; moderate MR.  Functional class difficult to know as he arrived on a stretcher. Suspect he remains NYHA III. Appears very frail. Suspect he needs Palliative Care for goals of care.  - Volume status trending up. Start torsemide 40 mg daily.  - Check BMET today and in 7 days.   - Continue spironolactone 12.5 mg qhs.   - Off hydralazine/isordil due to hypotension.      -Have been holding off on beta blocker given long 1st degree AVB to limit RV pacing.  -No ACEI or ARNI with angioedema on lisinopril. 2. CAD: S/p PCI 07/2018 to pLAD but CAD does not explain his cardiomyopathy.  No chest pain.  - He is off Plavix now, on Eliquis for his atrial fibrillation.  - Continue Crestor 3.Atrial fibrillation: Paroxysmal.  - regular on exam.  - Continue Eliquis 2.5 mg bid (age, creatinine > 1.5).  - Continue amiodarone 100 mg daily, LFTs and TSH checked last visit and ok. He needs a regular eye exam while on amiodarone.  4. CKD: Stage 3.  Check BMET  5. Hypothyroidism: Suspect this is due to amiodarone.  Continue synthryoid. TSH normal 9/21.   Follow up in 4 weeks to reassess. Orders sent for SNF.     Waneta Martins, NP  10/14/2020  [facility] [address] [facility] [place] [address] [phone] (office) [phone] (fax)

## 2020-10-14 ENCOUNTER — Other Ambulatory Visit: Payer: Self-pay

## 2020-10-14 ENCOUNTER — Ambulatory Visit (HOSPITAL_COMMUNITY)
Admit: 2020-10-14 | Discharge: 2020-10-14 | Disposition: A | Discharge status: Home / Self Care | Payer: No Typology Code available for payment source | Attending: Adult Health | Admitting: Adult Health

## 2020-10-14 VITALS — BP 108/65 | HR 76 | Wt 188.0 lb

## 2020-10-14 DIAGNOSIS — I480 Paroxysmal atrial fibrillation: Secondary | ICD-10-CM | POA: Diagnosis not present

## 2020-10-14 DIAGNOSIS — Z95810 Presence of automatic (implantable) cardiac defibrillator: Secondary | ICD-10-CM | POA: Insufficient documentation

## 2020-10-14 DIAGNOSIS — E1122 Type 2 diabetes mellitus with diabetic chronic kidney disease: Secondary | ICD-10-CM | POA: Diagnosis not present

## 2020-10-14 DIAGNOSIS — Z955 Presence of coronary angioplasty implant and graft: Secondary | ICD-10-CM | POA: Diagnosis not present

## 2020-10-14 DIAGNOSIS — I440 Atrioventricular block, first degree: Secondary | ICD-10-CM | POA: Diagnosis not present

## 2020-10-14 DIAGNOSIS — I428 Other cardiomyopathies: Secondary | ICD-10-CM | POA: Diagnosis not present

## 2020-10-14 DIAGNOSIS — N1831 Chronic kidney disease, stage 3a: Secondary | ICD-10-CM | POA: Diagnosis not present

## 2020-10-14 DIAGNOSIS — Z7901 Long term (current) use of anticoagulants: Secondary | ICD-10-CM | POA: Insufficient documentation

## 2020-10-14 DIAGNOSIS — I5022 Chronic systolic (congestive) heart failure: Secondary | ICD-10-CM | POA: Diagnosis not present

## 2020-10-14 DIAGNOSIS — Z79890 Hormone replacement therapy (postmenopausal): Secondary | ICD-10-CM | POA: Insufficient documentation

## 2020-10-14 DIAGNOSIS — Z87891 Personal history of nicotine dependence: Secondary | ICD-10-CM | POA: Diagnosis not present

## 2020-10-14 DIAGNOSIS — E785 Hyperlipidemia, unspecified: Secondary | ICD-10-CM | POA: Insufficient documentation

## 2020-10-14 DIAGNOSIS — I130 Hypertensive heart and chronic kidney disease with heart failure and stage 1 through stage 4 chronic kidney disease, or unspecified chronic kidney disease: Secondary | ICD-10-CM | POA: Insufficient documentation

## 2020-10-14 DIAGNOSIS — Z79899 Other long term (current) drug therapy: Secondary | ICD-10-CM | POA: Insufficient documentation

## 2020-10-14 DIAGNOSIS — J9611 Chronic respiratory failure with hypoxia: Secondary | ICD-10-CM | POA: Diagnosis not present

## 2020-10-14 DIAGNOSIS — Z8249 Family history of ischemic heart disease and other diseases of the circulatory system: Secondary | ICD-10-CM | POA: Insufficient documentation

## 2020-10-14 DIAGNOSIS — I2510 Atherosclerotic heart disease of native coronary artery without angina pectoris: Secondary | ICD-10-CM | POA: Diagnosis not present

## 2020-10-14 DIAGNOSIS — Z9981 Dependence on supplemental oxygen: Secondary | ICD-10-CM | POA: Diagnosis not present

## 2020-10-14 DIAGNOSIS — N183 Chronic kidney disease, stage 3 unspecified: Secondary | ICD-10-CM | POA: Insufficient documentation

## 2020-10-14 DIAGNOSIS — E039 Hypothyroidism, unspecified: Secondary | ICD-10-CM | POA: Diagnosis not present

## 2020-10-14 LAB — BASIC METABOLIC PANEL
Anion gap: 7 (ref 5–15)
BUN: 20 mg/dL (ref 8–23)
CO2: 23 mmol/L (ref 22–32)
Calcium: 9.3 mg/dL (ref 8.9–10.3)
Chloride: 116 mmol/L — ABNORMAL HIGH (ref 98–111)
Creatinine, Ser: 1.4 mg/dL — ABNORMAL HIGH (ref 0.61–1.24)
GFR, Estimated: 50 mL/min — ABNORMAL LOW (ref >60)
Glucose, Bld: 88 mg/dL (ref 70–99)
Potassium: 4.7 mmol/L (ref 3.5–5.1)
Sodium: 146 mmol/L — ABNORMAL HIGH (ref 135–145)

## 2020-10-14 NOTE — Patient Instructions (Signed)
START Torsemide 40 mg daily  Labs today We will only contact you if something comes back abnormal or we need to make some changes. Otherwise no news is good news!  Labs needed in 7-10 days  Your physician recommends that you schedule a follow-up appointment in: 4 weeks  If you have any questions or concerns before your next appointment please send [place] a message through [facility] or call our office at [phone].    TO LEAVE A MESSAGE FOR THE NURSE SELECT OPTION 2, PLEASE LEAVE A MESSAGE INCLUDING: . YOUR NAME . DATE OF BIRTH . CALL BACK NUMBER . REASON FOR CALL**this is important as we prioritize the call backs  YOU WILL RECEIVE A CALL BACK THE SAME DAY AS LONG AS YOU CALL BEFORE 4:00 PM

## 2020-11-03 ENCOUNTER — Telehealth (HOSPITAL_COMMUNITY): Payer: Self-pay

## 2020-11-03 NOTE — Telephone Encounter (Signed)
Spoke to pts wife today, he is still in SNF. She reports he is not doing well at all.  She states they are not feeding him there, they bring in a tray of food, but he cant do anything for himself and nobody is going in there to help him eat.  Sounds like he is going to be there for a while longer.  She is going to speak to a director somewhere and report that the staff is not tending to him as they should be and wanting to get him moved or to investigate.   [name], EMT-Paramedic  11/03/20

## 2020-11-05 ENCOUNTER — Telehealth (HOSPITAL_COMMUNITY): Payer: Self-pay

## 2020-11-05 ENCOUNTER — Other Ambulatory Visit (HOSPITAL_COMMUNITY): Payer: Self-pay | Admitting: Cardiology

## 2020-11-05 NOTE — Telephone Encounter (Signed)
Pt is in SNF in [facility] and has been there for quite a few wks now, at last clinic visit he was not doing so good. Spoke to wife this week and he still not doing well. He has clinic visit next week.  For right now he will be taken off our list. Wife will call me if he returns home.  [name], EMT-Paramedic  11/05/20

## 2020-11-07 ENCOUNTER — Other Ambulatory Visit: Payer: Self-pay

## 2020-11-07 ENCOUNTER — Inpatient Hospital Stay (HOSPITAL_COMMUNITY)
Admission: EM | Admit: 2020-11-07 | Discharge: 2020-11-15 | DRG: 380 | Disposition: A | Discharge status: Hospice | Payer: No Typology Code available for payment source | Source: Skilled Nursing Facility | Attending: Internal Medicine | Admitting: Internal Medicine

## 2020-11-07 ENCOUNTER — Emergency Department (HOSPITAL_COMMUNITY): Payer: No Typology Code available for payment source

## 2020-11-07 ENCOUNTER — Encounter (HOSPITAL_COMMUNITY): Payer: Self-pay | Admitting: Emergency Medicine

## 2020-11-07 DIAGNOSIS — Z66 Do not resuscitate: Secondary | ICD-10-CM | POA: Diagnosis not present

## 2020-11-07 DIAGNOSIS — N171 Acute kidney failure with acute cortical necrosis: Secondary | ICD-10-CM | POA: Diagnosis not present

## 2020-11-07 DIAGNOSIS — Z95810 Presence of automatic (implantable) cardiac defibrillator: Secondary | ICD-10-CM | POA: Diagnosis present

## 2020-11-07 DIAGNOSIS — Z79899 Other long term (current) drug therapy: Secondary | ICD-10-CM

## 2020-11-07 DIAGNOSIS — Z6829 Body mass index (BMI) 29.0-29.9, adult: Secondary | ICD-10-CM

## 2020-11-07 DIAGNOSIS — N183 Chronic kidney disease, stage 3 unspecified: Secondary | ICD-10-CM | POA: Diagnosis not present

## 2020-11-07 DIAGNOSIS — N200 Calculus of kidney: Secondary | ICD-10-CM | POA: Diagnosis not present

## 2020-11-07 DIAGNOSIS — I959 Hypotension, unspecified: Secondary | ICD-10-CM | POA: Diagnosis present

## 2020-11-07 DIAGNOSIS — K922 Gastrointestinal hemorrhage, unspecified: Secondary | ICD-10-CM

## 2020-11-07 DIAGNOSIS — I130 Hypertensive heart and chronic kidney disease with heart failure and stage 1 through stage 4 chronic kidney disease, or unspecified chronic kidney disease: Secondary | ICD-10-CM | POA: Diagnosis present

## 2020-11-07 DIAGNOSIS — R4182 Altered mental status, unspecified: Secondary | ICD-10-CM

## 2020-11-07 DIAGNOSIS — G9341 Metabolic encephalopathy: Secondary | ICD-10-CM | POA: Diagnosis present

## 2020-11-07 DIAGNOSIS — Z7189 Other specified counseling: Secondary | ICD-10-CM | POA: Diagnosis not present

## 2020-11-07 DIAGNOSIS — E785 Hyperlipidemia, unspecified: Secondary | ICD-10-CM | POA: Diagnosis present

## 2020-11-07 DIAGNOSIS — I480 Paroxysmal atrial fibrillation: Secondary | ICD-10-CM | POA: Diagnosis present

## 2020-11-07 DIAGNOSIS — Z7901 Long term (current) use of anticoagulants: Secondary | ICD-10-CM

## 2020-11-07 DIAGNOSIS — N1832 Chronic kidney disease, stage 3b: Secondary | ICD-10-CM | POA: Diagnosis present

## 2020-11-07 DIAGNOSIS — R933 Abnormal findings on diagnostic imaging of other parts of digestive tract: Secondary | ICD-10-CM | POA: Diagnosis not present

## 2020-11-07 DIAGNOSIS — K2211 Ulcer of esophagus with bleeding: Secondary | ICD-10-CM | POA: Diagnosis present

## 2020-11-07 DIAGNOSIS — K529 Noninfective gastroenteritis and colitis, unspecified: Secondary | ICD-10-CM | POA: Diagnosis present

## 2020-11-07 DIAGNOSIS — N10 Acute tubulo-interstitial nephritis: Secondary | ICD-10-CM | POA: Diagnosis not present

## 2020-11-07 DIAGNOSIS — Z20822 Contact with and (suspected) exposure to covid-19: Secondary | ICD-10-CM | POA: Diagnosis present

## 2020-11-07 DIAGNOSIS — K2970 Gastritis, unspecified, without bleeding: Secondary | ICD-10-CM

## 2020-11-07 DIAGNOSIS — A419 Sepsis, unspecified organism: Secondary | ICD-10-CM

## 2020-11-07 DIAGNOSIS — E1122 Type 2 diabetes mellitus with diabetic chronic kidney disease: Secondary | ICD-10-CM | POA: Diagnosis present

## 2020-11-07 DIAGNOSIS — L8990 Pressure ulcer of unspecified site, unspecified stage: Secondary | ICD-10-CM | POA: Insufficient documentation

## 2020-11-07 DIAGNOSIS — E872 Acidosis, unspecified: Secondary | ICD-10-CM | POA: Diagnosis present

## 2020-11-07 DIAGNOSIS — R54 Age-related physical debility: Secondary | ICD-10-CM | POA: Diagnosis present

## 2020-11-07 DIAGNOSIS — D696 Thrombocytopenia, unspecified: Secondary | ICD-10-CM

## 2020-11-07 DIAGNOSIS — K921 Melena: Secondary | ICD-10-CM | POA: Diagnosis not present

## 2020-11-07 DIAGNOSIS — E870 Hyperosmolality and hypernatremia: Secondary | ICD-10-CM | POA: Diagnosis present

## 2020-11-07 DIAGNOSIS — R6520 Severe sepsis without septic shock: Secondary | ICD-10-CM | POA: Diagnosis not present

## 2020-11-07 DIAGNOSIS — Z96642 Presence of left artificial hip joint: Secondary | ICD-10-CM | POA: Diagnosis present

## 2020-11-07 DIAGNOSIS — E861 Hypovolemia: Secondary | ICD-10-CM | POA: Diagnosis present

## 2020-11-07 DIAGNOSIS — E1169 Type 2 diabetes mellitus with other specified complication: Secondary | ICD-10-CM | POA: Diagnosis present

## 2020-11-07 DIAGNOSIS — I428 Other cardiomyopathies: Secondary | ICD-10-CM

## 2020-11-07 DIAGNOSIS — I10 Essential (primary) hypertension: Secondary | ICD-10-CM | POA: Diagnosis present

## 2020-11-07 DIAGNOSIS — B9681 Helicobacter pylori [H. pylori] as the cause of diseases classified elsewhere: Secondary | ICD-10-CM | POA: Diagnosis not present

## 2020-11-07 DIAGNOSIS — N179 Acute kidney failure, unspecified: Secondary | ICD-10-CM | POA: Diagnosis present

## 2020-11-07 DIAGNOSIS — R7989 Other specified abnormal findings of blood chemistry: Secondary | ICD-10-CM

## 2020-11-07 DIAGNOSIS — E7800 Pure hypercholesterolemia, unspecified: Secondary | ICD-10-CM | POA: Diagnosis present

## 2020-11-07 DIAGNOSIS — Z8249 Family history of ischemic heart disease and other diseases of the circulatory system: Secondary | ICD-10-CM

## 2020-11-07 DIAGNOSIS — R310 Gross hematuria: Secondary | ICD-10-CM | POA: Diagnosis not present

## 2020-11-07 DIAGNOSIS — I5022 Chronic systolic (congestive) heart failure: Secondary | ICD-10-CM | POA: Diagnosis present

## 2020-11-07 DIAGNOSIS — R627 Adult failure to thrive: Secondary | ICD-10-CM | POA: Diagnosis present

## 2020-11-07 DIAGNOSIS — E669 Obesity, unspecified: Secondary | ICD-10-CM | POA: Diagnosis present

## 2020-11-07 DIAGNOSIS — Z515 Encounter for palliative care: Secondary | ICD-10-CM

## 2020-11-07 DIAGNOSIS — N12 Tubulo-interstitial nephritis, not specified as acute or chronic: Secondary | ICD-10-CM | POA: Diagnosis present

## 2020-11-07 DIAGNOSIS — D62 Acute posthemorrhagic anemia: Secondary | ICD-10-CM | POA: Diagnosis present

## 2020-11-07 DIAGNOSIS — E119 Type 2 diabetes mellitus without complications: Secondary | ICD-10-CM | POA: Diagnosis present

## 2020-11-07 DIAGNOSIS — E876 Hypokalemia: Secondary | ICD-10-CM | POA: Diagnosis not present

## 2020-11-07 DIAGNOSIS — Z888 Allergy status to other drugs, medicaments and biological substances status: Secondary | ICD-10-CM

## 2020-11-07 DIAGNOSIS — E039 Hypothyroidism, unspecified: Secondary | ICD-10-CM | POA: Diagnosis present

## 2020-11-07 DIAGNOSIS — Z87442 Personal history of urinary calculi: Secondary | ICD-10-CM | POA: Diagnosis not present

## 2020-11-07 DIAGNOSIS — J9611 Chronic respiratory failure with hypoxia: Secondary | ICD-10-CM | POA: Diagnosis present

## 2020-11-07 DIAGNOSIS — Z87891 Personal history of nicotine dependence: Secondary | ICD-10-CM

## 2020-11-07 DIAGNOSIS — Z955 Presence of coronary angioplasty implant and graft: Secondary | ICD-10-CM

## 2020-11-07 DIAGNOSIS — E860 Dehydration: Secondary | ICD-10-CM | POA: Diagnosis present

## 2020-11-07 DIAGNOSIS — Z79890 Hormone replacement therapy (postmenopausal): Secondary | ICD-10-CM

## 2020-11-07 DIAGNOSIS — K219 Gastro-esophageal reflux disease without esophagitis: Secondary | ICD-10-CM | POA: Diagnosis present

## 2020-11-07 DIAGNOSIS — E43 Unspecified severe protein-calorie malnutrition: Secondary | ICD-10-CM | POA: Diagnosis present

## 2020-11-07 LAB — CBC WITH DIFFERENTIAL/PLATELET
Abs Immature Granulocytes: 0.03 10*3/uL (ref 0.00–0.07)
Basophils Absolute: 0 10*3/uL (ref 0.0–0.1)
Basophils Relative: 0 %
Eosinophils Absolute: 0 10*3/uL (ref 0.0–0.5)
Eosinophils Relative: 0 %
HCT: 22.2 % — ABNORMAL LOW (ref 39.0–52.0)
Hemoglobin: 6.8 g/dL — CL (ref 13.0–17.0)
Immature Granulocytes: 0 %
Lymphocytes Relative: 11 %
Lymphs Abs: 1.2 10*3/uL (ref 0.7–4.0)
MCH: 34 pg (ref 26.0–34.0)
MCHC: 30.6 g/dL (ref 30.0–36.0)
MCV: 111 fL — ABNORMAL HIGH (ref 80.0–100.0)
Monocytes Absolute: 0.7 10*3/uL (ref 0.1–1.0)
Monocytes Relative: 7 %
Neutro Abs: 8.6 10*3/uL — ABNORMAL HIGH (ref 1.7–7.7)
Neutrophils Relative %: 82 %
Platelets: 266 10*3/uL (ref 150–400)
RBC: 2 MIL/uL — ABNORMAL LOW (ref 4.22–5.81)
RDW: 20.8 % — ABNORMAL HIGH (ref 11.5–15.5)
WBC: 10.6 10*3/uL — ABNORMAL HIGH (ref 4.0–10.5)
nRBC: 0.7 % — ABNORMAL HIGH (ref 0.0–0.2)

## 2020-11-07 LAB — PREPARE RBC (CROSSMATCH)

## 2020-11-07 LAB — COMPREHENSIVE METABOLIC PANEL
ALT: 32 U/L (ref 0–44)
AST: 74 U/L — ABNORMAL HIGH (ref 15–41)
Albumin: 2.2 g/dL — ABNORMAL LOW (ref 3.5–5.0)
Alkaline Phosphatase: 88 U/L (ref 38–126)
Anion gap: 13 (ref 5–15)
BUN: 92 mg/dL — ABNORMAL HIGH (ref 8–23)
CO2: 20 mmol/L — ABNORMAL LOW (ref 22–32)
Calcium: 9.2 mg/dL (ref 8.9–10.3)
Chloride: 124 mmol/L — ABNORMAL HIGH (ref 98–111)
Creatinine, Ser: 3.76 mg/dL — ABNORMAL HIGH (ref 0.61–1.24)
GFR, Estimated: 15 mL/min — ABNORMAL LOW (ref >60)
Glucose, Bld: 134 mg/dL — ABNORMAL HIGH (ref 70–99)
Potassium: 4.4 mmol/L (ref 3.5–5.1)
Sodium: 157 mmol/L — ABNORMAL HIGH (ref 135–145)
Total Bilirubin: 2.3 mg/dL — ABNORMAL HIGH (ref 0.3–1.2)
Total Protein: 7.3 g/dL (ref 6.5–8.1)

## 2020-11-07 LAB — URINALYSIS, ROUTINE W REFLEX MICROSCOPIC
Bilirubin Urine: NEGATIVE
Glucose, UA: NEGATIVE mg/dL
Ketones, ur: 5 mg/dL — AB
Nitrite: NEGATIVE
Protein, ur: 100 mg/dL — AB
Specific Gravity, Urine: 1.017 (ref 1.005–1.030)
WBC, UA: >50 WBC/hpf — ABNORMAL HIGH (ref 0–5)
pH: 5 (ref 5.0–8.0)

## 2020-11-07 LAB — LACTIC ACID, PLASMA
Lactic Acid, Venous: 3.9 mmol/L (ref 0.5–1.9)
Lactic Acid, Venous: 2.6 mmol/L (ref 0.5–1.9)

## 2020-11-07 LAB — BRAIN NATRIURETIC PEPTIDE: B Natriuretic Peptide: 306 pg/mL — ABNORMAL HIGH (ref 0.0–100.0)

## 2020-11-07 LAB — RESP PANEL BY RT-PCR (FLU A&B, COVID) ARPGX2
Influenza A by PCR: NEGATIVE
Influenza B by PCR: NEGATIVE
SARS Coronavirus 2 by RT PCR: NEGATIVE

## 2020-11-07 LAB — ABO/RH: ABO/RH(D): O POS

## 2020-11-07 LAB — CBG MONITORING, ED: Glucose-Capillary: 118 mg/dL — ABNORMAL HIGH (ref 70–99)

## 2020-11-07 LAB — POC OCCULT BLOOD, ED: Fecal Occult Bld: POSITIVE — AB

## 2020-11-07 MED ORDER — SODIUM CHLORIDE 0.9 % IV SOLN
80.0000 mg | Freq: Once | INTRAVENOUS | Status: AC
  Administered 2020-11-07: 80 mg via INTRAVENOUS
  Filled 2020-11-07: qty 80

## 2020-11-07 MED ORDER — VANCOMYCIN HCL IN DEXTROSE 1-5 GM/200ML-% IV SOLN: 1000.0000 mg | INTRAVENOUS | Status: DC

## 2020-11-07 MED ORDER — SODIUM CHLORIDE 0.9 % IV SOLN
2.0000 g | INTRAVENOUS | Status: DC
  Administered 2020-11-08 – 2020-11-09 (×2): 2 g via INTRAVENOUS
  Filled 2020-11-07 (×2): qty 2

## 2020-11-07 MED ORDER — LACTATED RINGERS IV SOLN: INTRAVENOUS | Status: DC

## 2020-11-07 MED ORDER — SODIUM CHLORIDE 0.9% IV SOLUTION: Freq: Once | INTRAVENOUS | Status: DC

## 2020-11-07 MED ORDER — SODIUM CHLORIDE 0.9 % IV SOLN: INTRAVENOUS | Status: DC

## 2020-11-07 MED ORDER — SODIUM CHLORIDE 0.9 % IV SOLN
8.0000 mg/h | INTRAVENOUS | Status: AC
  Administered 2020-11-07 – 2020-11-10 (×6): 8 mg/h via INTRAVENOUS
  Filled 2020-11-07 (×2): qty 80
  Filled 2020-11-07 (×3): qty 35.56
  Filled 2020-11-07: qty 80
  Filled 2020-11-07: qty 35.56
  Filled 2020-11-07: qty 80

## 2020-11-07 MED ORDER — SODIUM CHLORIDE 0.9 % IV SOLN
2.0000 g | Freq: Once | INTRAVENOUS | Status: AC
  Administered 2020-11-07: 2 g via INTRAVENOUS
  Filled 2020-11-07: qty 2

## 2020-11-07 MED ORDER — PANTOPRAZOLE SODIUM 40 MG IV SOLR
40.0000 mg | Freq: Two times a day (BID) | INTRAVENOUS | Status: DC
  Administered 2020-11-11 – 2020-11-13 (×5): 40 mg via INTRAVENOUS
  Filled 2020-11-07 (×5): qty 40

## 2020-11-07 MED ORDER — METRONIDAZOLE IN NACL 5-0.79 MG/ML-% IV SOLN
500.0000 mg | Freq: Three times a day (TID) | INTRAVENOUS | Status: DC
  Administered 2020-11-08 – 2020-11-11 (×11): 500 mg via INTRAVENOUS
  Filled 2020-11-07 (×11): qty 100

## 2020-11-07 MED ORDER — VANCOMYCIN HCL IN DEXTROSE 1-5 GM/200ML-% IV SOLN: 1000.0000 mg | Freq: Once | INTRAVENOUS | Status: DC

## 2020-11-07 MED ORDER — VANCOMYCIN HCL 1500 MG/300ML IV SOLN
1500.0000 mg | Freq: Once | INTRAVENOUS | Status: AC
  Administered 2020-11-07: 1500 mg via INTRAVENOUS
  Filled 2020-11-07: qty 300

## 2020-11-07 MED ORDER — HYDROCORTISONE NA SUCCINATE PF 100 MG IJ SOLR
100.0000 mg | Freq: Three times a day (TID) | INTRAMUSCULAR | Status: DC
  Administered 2020-11-07: 100 mg via INTRAVENOUS
  Filled 2020-11-07: qty 2

## 2020-11-07 MED ORDER — SODIUM CHLORIDE 0.9 % IV BOLUS
500.0000 mL | Freq: Once | INTRAVENOUS | Status: AC
  Administered 2020-11-07: 500 mL via INTRAVENOUS

## 2020-11-07 MED ORDER — SODIUM CHLORIDE 0.9 % IV SOLN
10.0000 mL/h | Freq: Once | INTRAVENOUS | Status: AC
  Administered 2020-11-07: 10 mL/h via INTRAVENOUS

## 2020-11-07 MED ORDER — METRONIDAZOLE IN NACL 5-0.79 MG/ML-% IV SOLN
500.0000 mg | Freq: Once | INTRAVENOUS | Status: AC
  Administered 2020-11-07: 500 mg via INTRAVENOUS
  Filled 2020-11-07: qty 100

## 2020-11-07 MED ORDER — HYDROCORTISONE NA SUCCINATE PF 100 MG IJ SOLR
50.0000 mg | Freq: Four times a day (QID) | INTRAMUSCULAR | Status: DC
  Administered 2020-11-08 (×3): 50 mg via INTRAVENOUS
  Filled 2020-11-07 (×3): qty 2

## 2020-11-07 MED ORDER — SODIUM CHLORIDE 0.45 % IV SOLN: INTRAVENOUS | Status: DC

## 2020-11-07 MED ORDER — METRONIDAZOLE 500 MG PO TABS: 500.0000 mg | ORAL_TABLET | Freq: Three times a day (TID) | ORAL | Status: DC

## 2020-11-07 NOTE — ED Notes (Signed)
Admitting MD at the bedside.  

## 2020-11-07 NOTE — ED Notes (Signed)
CRITICAL VALUE ALERT  Critical Value:  Lactica acid 3.9  Date & Time Notied:  11/07/20 & 1548hrs  Provider Notified: Dr. Criss Alvine  Orders Received/Actions taken: Notified

## 2020-11-07 NOTE — ED Notes (Signed)
Tolerating blood well, no reaction noted. VS stable.

## 2020-11-07 NOTE — Progress Notes (Signed)
Pharmacy Antibiotic Note  Ryan Macdonald is a 86 y.o. male admitted on 11/07/2020 with unknown source.  Pharmacy has been consulted for Vancomycin and cefepime dosing.  Plan: Vancomycin 1500mg  loading dose, then 1000mg  IV every 48 hours.  Goal trough 15-20 mcg/mL.  Cefepime 2gm IV q24h F/U cxs and clinical progress Monitor V/S, labs and levels as indicated  Height: 5\' 7"  (170.2 cm) Weight: 85.3 kg (188 lb) IBW/kg (Calculated) : 66.1  Temp (24hrs), Avg:98.1 F (36.7 C), Min:98.1 F (36.7 C), Max:98.1 F (36.7 C)  Recent Labs  Lab 11/07/20 1527 11/07/20 1528  WBC  --  10.6*  CREATININE  --  3.76*  LATICACIDVEN 3.9*  --     Estimated Creatinine Clearance: 15.3 mL/min (A) (by C-G formula based on SCr of 3.76 mg/dL (H)).    Allergies  Allergen Reactions  . Lisinopril Swelling    Facial swelling  . Xarelto [Rivaroxaban] Other (See Comments)    "Blood came out of my penis"    Antimicrobials this admission: Vancomycin 12/10 >>  Cefepime 12/10 >> Flagyl po 12/10>>   Dose adjustments this admission: prn  Microbiology results: 12/10 BCx: pending 12/10 UCx: pending  MRSA PCR:  Thank you for allowing pharmacy to be a part of this patient's care.  14/10, BS 14/10, 14/10 Clinical Pharmacist Pager [phone] 11/07/2020 4:04 PM

## 2020-11-07 NOTE — ED Notes (Signed)
HS working on protonix drip at this time.

## 2020-11-07 NOTE — ED Notes (Signed)
Second blood cultures drawn by Fleet Contras, RN.

## 2020-11-07 NOTE — ED Notes (Signed)
Tolerating blood well, no reactions noted. VS stable

## 2020-11-07 NOTE — ED Notes (Signed)
Called HS for protonix drip.

## 2020-11-07 NOTE — Sepsis Progress Note (Signed)
Sepsis protocol is being followed by [facility]. 

## 2020-11-07 NOTE — ED Triage Notes (Signed)
Pt from [facility]. Sent here for pt increasing weakness, lethargy, and decreased oral intake.   PT unable to verbalize any complaints.

## 2020-11-07 NOTE — ED Notes (Signed)
Not able to obtain 2nd IV site

## 2020-11-07 NOTE — ED Provider Notes (Signed)
3:55 PM Care transferred to me.  Patient's lactate has come back at 3.9.  He has soft blood pressures.  Has had lethargy and sepsis before, seems to be from UTIs.  Discussed with pharmacy, given unclear source at this time with relatively low data, would recommend broadly treating for sepsis and then can tailor based on results from there.  Will give Vanco, cefepime, Flagyl.  4:12 PM Other labs are coming back showing acute on chronic anemia and AKI. I discussed these findings and sepsis findings with wife over the phone.  We discussed how ill the patient is and that he might not come out of this.  At this point she would like him to still be a full code as those were his wishes.  We will continue to give fluids, blood transfusion and broad IV antibiotics.  5:51 PM Patient continues to have soft BP's but mental status seems a little better to the nurse.  We will give IV Flagyl given the colitis seen on CT.  He will need admission.  Given a combination of fluids and blood. Dr Randol Kern to admit. Currently full code.   CRITICAL CARE Performed by: Audree Camel   Total critical care time: 45 minutes  Critical care time was exclusive of separately billable procedures and treating other patients.  Critical care was necessary to treat or prevent imminent or life-threatening deterioration.  Critical care was time spent personally by me on the following activities: development of treatment plan with patient and/or surrogate as well as nursing, discussions with consultants, evaluation of patient's response to treatment, examination of patient, obtaining history from patient or surrogate, ordering and performing treatments and interventions, ordering and review of laboratory studies, ordering and review of radiographic studies, pulse oximetry and re-evaluation of patient's condition.    Pricilla Loveless, MD 11/07/20 [phone]

## 2020-11-07 NOTE — ED Notes (Signed)
Pt not able to take po meds, MD to change to IV for later time.

## 2020-11-07 NOTE — H&P (Signed)
TRH H&P   Patient Demographics:    Ryan Macdonald, is a 86 y.o. male  MRN: [id number]   DOB - [birth year]  Admit Date - 11/07/2020  Outpatient Primary MD for the patient is Sherol Dade, DO  Referring MD/NP/PA: Dr Lawrence Marseilles    Patient coming from: [facility]   Chief Complaint  Patient presents with  . Failure To Thrive      HPI:    Ryan Macdonald  is a 86 y.o. male,  male with medical history significant of HFrEF (EF 20 to 25%, AICD in place), paroxysmal A. fib on amiodarone and Eliquis, hypertension, chronic kidney disease 3B, hyperlipidemia on a statin, gout, hypothyroidism, and recent sepsis related to a UTI patient with multiple recent hospitalizations, she is currently altered, cannot provide any history, it was obtained from ED staff and wife by phone. -Patient was sent by [facility] for increased weakness, lethargy, and decreased oral intake, in ED patient was noted to be unresponsive at all, medicated provide any complaints, patient was noted to to have melena while in ED, Hemoccult came back positive, as well wife mentioned patient with poor oral intake recently, and there was discussion with facility staff about arranging for peg tube during his poor oral intake. -in ED evaluation was significant for melena, Hemoccult positive stool, with hemoglobin of 6.8, as well of sodium of 157, worsening creatinine and BUN, creatinine 3.76, BUN of 92, BNP elevated at 306, mildly elevated AST at 74, CT abdomen and pelvis was significant for colitis, as well there is some evidence of right pleural effusion, and UA was strongly positive, as well patient was noted to have soft blood pressure, [facility] hospitalist consulted to admit.    Review of systems:    Significantly altered, unable to provide any review of systems.  With Past History of the following :    Past Medical  History:  Diagnosis Date  . Acute bronchitis 05/10/2017  . Acute respiratory failure (HCC)   . Acute respiratory failure with hypoxia (HCC) 09/27/2017  . AKI (acute kidney injury) (HCC) 02/2019  . AKI (acute kidney injury) (HCC) 03/14/2019  . Atrial fibrillation (HCC)   . CAP (community acquired pneumonia) 02/03/2017  . Cardiomyopathy (HCC)   . Cat scratch fever    removed mass on right side neck  . Cataracts, bilateral   . CHF (congestive heart failure) (HCC)   . CHF exacerbation (HCC) 09/27/2017  . Chronic kidney disease   . Corns and callosities   . Diabetes mellitus without complication (HCC)    TYPE 2  . Dyspnea   . Erythema intertrigo   . Flat foot   . Gout   . High cholesterol   . Hx of urethral stricture   . Hypertension   . Kidney stones   . Morbid obesity (HCC)   . Nonischemic cardiomyopathy (HCC)    a. ? 2009 Cath  in [place] - nl cors per pt;  b. 09/2013 Echo: EF 25-30%, sev diff HK.  . Obesity   . Osteoarthritis   . PAF (paroxysmal atrial fibrillation) (HCC)    a. post-op hip in 2014 - prev on xarelto.  . Renal insufficiency       Past Surgical History:  Procedure Laterality Date  . CARDIAC CATHETERIZATION  1980's  . CARDIOVERSION N/A 01/15/2020   Procedure: CARDIOVERSION;  Surgeon: Laurey Morale, MD;  Location: [facility] ENDOSCOPY;  Service: Cardiovascular;  Laterality: N/A;  . CIRCUMCISION  1940's  . CORONARY STENT INTERVENTION N/A 08/18/2018   Procedure: CORONARY STENT INTERVENTION;  Surgeon: Corky Crafts, MD;  Location: [facility] INVASIVE CV LAB;  Service: Cardiovascular;  Laterality: N/A;  . CYSTOSCOPY WITH URETHRAL DILATATION  10/23/2013  . CYSTOSCOPY WITH URETHRAL DILATATION N/A 10/23/2013   Procedure: CYSTOSCOPY WITH URETHRAL DILATATION;  Surgeon: Kathi Ludwig, MD;  Location: [facility] OR;  Service: Urology;  Laterality: N/A;  . INCISION AND DRAINAGE OF WOUND Right 1980's   "cat scratch" (10/23/2013)  . INGUINAL HERNIA REPAIR Right 1950's  . INTRAVASCULAR  ULTRASOUND/IVUS N/A 08/18/2018   Procedure: Intravascular Ultrasound/IVUS;  Surgeon: Corky Crafts, MD;  Location: [facility] INVASIVE CV LAB;  Service: Cardiovascular;  Laterality: N/A;  . KIDNEY STONE SURGERY Right 1970's; 1983   "twice"  . LEFT HEART CATH AND CORONARY ANGIOGRAPHY N/A 08/18/2018   Procedure: LEFT HEART CATH AND CORONARY ANGIOGRAPHY;  Surgeon: Corky Crafts, MD;  Location: [facility] INVASIVE CV LAB;  Service: Cardiovascular;  Laterality: N/A;  . RIGHT HEART CATH N/A 02/19/2020   Procedure: RIGHT HEART CATH;  Surgeon: Laurey Morale, MD;  Location: [facility] INVASIVE CV LAB;  Service: Cardiovascular;  Laterality: N/A;  . TEE WITHOUT CARDIOVERSION N/A 01/15/2020   Procedure: TRANSESOPHAGEAL ECHOCARDIOGRAM (TEE);  Surgeon: Laurey Morale, MD;  Location: [facility] ENDOSCOPY;  Service: Cardiovascular;  Laterality: N/A;  . TONSILLECTOMY AND ADENOIDECTOMY  1940's  . TOTAL HIP ARTHROPLASTY Left 10/23/2013   Procedure: TOTAL HIP ARTHROPLASTY;  Surgeon: Valeria Batman, MD;  Location: [facility] OR;  Service: Orthopedics;  Laterality: Left;      Social History:     Social History   Tobacco Use  . Smoking status: Former Smoker    Packs/day: 1.00    Years: 15.00    Pack years: 15.00    Types: Cigarettes, Cigars  . Smokeless tobacco: Former Neurosurgeon  . Tobacco comment: quit smoking ~ 50 yr ago  Substance Use Topics  . Alcohol use: No    Alcohol/week: 2.0 standard drinks    Types: 2 Cans of beer per week    Comment: rare beer.     Family History :     Family History  Problem Relation Age of Onset  . Heart disease Mother   . Heart Problems Maternal Grandmother   . Other Other        negative for premature CAD     Home Medications:   Prior to Admission medications   Medication Sig Start Date End Date Taking? Authorizing Provider  acetaminophen (TYLENOL) 325 MG tablet Take 650 mg by mouth every 6 (six) hours as needed.   Yes [provider]  apixaban (ELIQUIS) 2.5 MG TABS tablet  Take 1 tablet (2.5 mg total) by mouth 2 (two) times daily. 03/27/20  Yes Laurey Morale, MD  Ascorbic Acid (VITAMIN C) 500 MG CAPS Take 500 mg by mouth in the morning and at bedtime.   Yes [provider]  diclofenac Sodium (VOLTAREN) 1 % GEL Apply 2 g topically 4 (four) times daily.   Yes [provider]  dronabinol (MARINOL) 5 MG capsule Take 1 capsule (5 mg total) by mouth 2 (two) times daily before lunch and supper. 10/12/20  Yes Drema Dallas, MD  HYDROcodone-acetaminophen (NORCO/VICODIN) 5-325 MG tablet Take 1 tablet by mouth 3 (three) times daily as needed for severe pain. 08/21/20  Yes Osvaldo Shipper, MD  lidocaine (LIDODERM) 5 % Place 1 patch onto the skin daily. Remove & Discard patch within 12 hours or as directed by MD 08/22/20  Yes Osvaldo Shipper, MD  ondansetron (ZOFRAN) 4 MG tablet Take 1 tablet (4 mg total) by mouth every 6 (six) hours as needed for nausea. 10/12/20  Yes Drema Dallas, MD  polyethylene glycol (MIRALAX / GLYCOLAX) 17 g packet Take 17 g by mouth 2 (two) times daily. 02/24/20  Yes Sheikh, Omair Latif, DO  Polysaccharide Iron Complex (POLY-IRON 150 PO) Take 1 capsule by mouth in the morning.   Yes [provider]  saccharomyces boulardii (FLORASTOR) 250 MG capsule Take 1 capsule (250 mg total) by mouth 2 (two) times daily. 08/21/20  Yes Osvaldo Shipper, MD  sertraline (ZOLOFT) 25 MG tablet Take 25 mg by mouth daily.   Yes [provider]  spironolactone (ALDACTONE) 25 MG tablet Take 0.5 tablets (12.5 mg total) by mouth at bedtime. 10/12/20  Yes Drema Dallas, MD  traZODone (DESYREL) 50 MG tablet Take 0.5 tablets (25 mg total) by mouth at bedtime as needed for sleep. 10/12/20  Yes Drema Dallas, MD  Zinc 50 MG CAPS Take 50 mg by mouth daily.   Yes [provider]  amiodarone (PACERONE) 100 MG tablet Take 1 tablet (100 mg total) by mouth daily. Patient not taking: No sig reported 10/12/20   Drema Dallas, MD  benzonatate  (TESSALON) 100 MG capsule Take 1 capsule (100 mg total) by mouth 3 (three) times daily as needed for cough. Patient not taking: No sig reported 08/21/20   Osvaldo Shipper, MD  cyanocobalamin 1000 MCG tablet Take 1 tablet (1,000 mcg total) by mouth daily. Patient not taking: No sig reported 03/13/19   Laurey Morale, MD  feeding supplement (ENSURE ENLIVE / ENSURE PLUS) LIQD Take 237 mLs by mouth 2 (two) times daily between meals. 09/25/20   Mikhail, Nita Sells, DO  finasteride (PROSCAR) 5 MG tablet Take 1 tablet (5 mg total) by mouth daily. Patient not taking: No sig reported 11/29/18   Alford Highland, NP  levothyroxine (SYNTHROID) 125 MCG tablet Take 1 tablet (125 mcg total) by mouth daily before breakfast. Patient not taking: No sig reported 08/01/19   Carlus Pavlov, MD  magnesium oxide (MAG-OX) 400 MG tablet Take 400 mg by mouth daily.     [provider]  Multiple Vitamin (MULTIVITAMIN WITH MINERALS) TABS tablet Take 1 tablet by mouth daily. Patient not taking: No sig reported 09/26/20   Edsel Petrin, DO  rosuvastatin (CRESTOR) 10 MG tablet Take 1 tablet (10 mg total) by mouth daily at 6 PM. Patient not taking: No sig reported 12/22/18   [name], MD     Allergies:     Allergies  Allergen Reactions  . Lisinopril Swelling    Facial swelling  . Xarelto [Rivaroxaban] Other (See Comments)    "Blood came out of my penis"     Physical Exam:   Vitals  Blood pressure (!) 93/51, pulse 77, temperature 98.1 F (36.7 C), temperature source  Oral, resp. rate 19, height 5\' 7"  (1.702 m), weight 85.3 kg, SpO2 99 %.   1. General seemingly frail, thin appearing, currently ill-appearing male, laying in bed, unresponsive  2.  Is unresponsive, does not follow commands or answer any questions   3.  Unable to do any physical exam due to his encephalopathy  4. Conjunctivae clear, dry oral mucosa.  5. Supple Neck, No JVD, No cervical lymphadenopathy appriciated, No Carotid  Bruits.  6. Symmetrical Chest wall movement, Good air movement bilaterally, CTAB.  7. RRR, No Gallops, Rubs or Murmurs, No Parasternal Heave.  8. Positive Bowel Sounds, Abdomen Soft,  No organomegaly appriciated,No rebound -guarding or rigidity.  9.  No Cyanosis, delayed skin Turgor, No Skin Rash or Bruise.  10.  Nephric and muscle wasting joints appear normal , no effusions  11. No Palpable Lymph Nodes in Neck or Axillae    Data Review:    CBC Recent Labs  Lab 11/07/20 1528  WBC 10.6*  HGB 6.8*  HCT 22.2*  PLT 266  MCV 111.0*  MCH 34.0  MCHC 30.6  RDW 20.8*  LYMPHSABS 1.2  MONOABS 0.7  EOSABS 0.0  BASOSABS 0.0   ------------------------------------------------------------------------------------------------------------------  Chemistries  Recent Labs  Lab 11/07/20 1528  NA 157*  K 4.4  CL 124*  CO2 20*  GLUCOSE 134*  BUN 92*  CREATININE 3.76*  CALCIUM 9.2  AST 74*  ALT 32  ALKPHOS 88  BILITOT 2.3*   ------------------------------------------------------------------------------------------------------------------ estimated creatinine clearance is 15.3 mL/min (A) (by C-G formula based on SCr of 3.76 mg/dL (H)). ------------------------------------------------------------------------------------------------------------------ No results for input(s): TSH, T4TOTAL, T3FREE, THYROIDAB in the last 72 hours.  Invalid input(s): FREET3  Coagulation profile No results for input(s): INR, PROTIME in the last 168 hours. ------------------------------------------------------------------------------------------------------------------- No results for input(s): DDIMER in the last 72 hours. -------------------------------------------------------------------------------------------------------------------  Cardiac Enzymes No results for input(s): CKMB, TROPONINI, MYOGLOBIN in the last 168 hours.  Invalid input(s):  CK ------------------------------------------------------------------------------------------------------------------    Component Value Date/Time   BNP 306.0 (H) 11/07/2020 1529     ---------------------------------------------------------------------------------------------------------------  Urinalysis    Component Value Date/Time   COLORURINE YELLOW 11/07/2020 1700   APPEARANCEUR CLOUDY (A) 11/07/2020 1700   LABSPEC 1.017 11/07/2020 1700   PHURINE 5.0 11/07/2020 1700   GLUCOSEU NEGATIVE 11/07/2020 1700   HGBUR LARGE (A) 11/07/2020 1700   BILIRUBINUR NEGATIVE 11/07/2020 1700   KETONESUR 5 (A) 11/07/2020 1700   PROTEINUR 100 (A) 11/07/2020 1700   UROBILINOGEN 0.2 05/26/2015 1521   NITRITE NEGATIVE 11/07/2020 1700   LEUKOCYTESUR MODERATE (A) 11/07/2020 1700    ----------------------------------------------------------------------------------------------------------------   Imaging Results:    CT ABDOMEN PELVIS WO CONTRAST  Result Date: 11/07/2020 CLINICAL DATA:  Melena with weakness and lethargy. EXAM: CT ABDOMEN AND PELVIS WITHOUT CONTRAST TECHNIQUE: Multidetector CT imaging of the abdomen and pelvis was performed following the standard protocol without IV contrast. COMPARISON:  September 26, 2020 FINDINGS: Lower chest: Mild atelectasis and/or infiltrate is seen within the right lung base. There is a moderate sized right pleural effusion. A small left pleural effusion is also noted. Hepatobiliary: No focal liver abnormality is seen. No gallstones, gallbladder wall thickening, or biliary dilatation. Pancreas: Unremarkable. No pancreatic ductal dilatation or surrounding inflammatory changes. Spleen: Normal in size without focal abnormality. Adrenals/Urinary Tract: Adrenal glands are unremarkable. The left kidney is normal in size. Moderate severity diffuse cortical thinning with renal atrophy is seen on the right. A stable 1.6 cm diameter cyst is noted within the posterior aspect of  the mid  left kidney. A 2.7 mm staghorn calculus is seen on the right with additional innumerable subcentimeter non-obstructing renal stones present throughout the right kidney. A moderate amount of air is seen within the calices of the right kidney and proximal right ureter. This represents a new finding when compared to the prior exam. No perinephric or periureteral inflammatory fat stranding is seen. A mild-to-moderate amount of air is seen within the lumen of a urinary bladder diverticulum. A small amount of air is seen within this region on the prior study. Stomach/Bowel: Stomach is within normal limits. The appendix is not clearly identified. No evidence of bowel dilatation. Mild to moderate severity thickening of the mid to distal sigmoid colon is seen (axial CT images 64 through 74, CT series number 2). Vascular/Lymphatic: Aortic atherosclerosis. No enlarged abdominal or pelvic lymph nodes. Reproductive: The prostate gland is poorly visualized secondary to overlying streak artifact. Other: A stable posterolateral right abdominal wall hernia is seen. No abdominopelvic ascites. Musculoskeletal: A chronic eleventh right rib fracture is seen. A left hip replacement is seen with associated streak artifact and subsequently limited evaluation of the adjacent osseous and soft tissue structures. Multilevel degenerative changes seen throughout the lumbar spine. IMPRESSION: 1. Mild to moderate severity colitis involving the mid to distal sigmoid colon. Follow-up to resolution is recommended to exclude the presence of an underlying neoplasm. 2. Moderate amount of calyceal air within the right kidney and proximal right ureter which the secondary to recent instrumentation. The presence of an underlying infectious process cannot be excluded. Correlation with urinalysis is recommended. 3. Bilateral pleural effusions, right greater than left. 4. Atrophic right kidney with extensive right-sided nephrolithiasis. 5. Left hip  replacement. 6. Aortic atherosclerosis. Aortic Atherosclerosis (ICD10-I70.0). Electronically Signed   By: Aram Candela M.D.   On: 11/07/2020 17:25   DG Chest 1 View  Result Date: 11/07/2020 CLINICAL DATA:  Altered mental status. EXAM: CHEST  1 VIEW COMPARISON:  September 20, 2020 FINDINGS: A single lead ventricular pacer is in place. There is no evidence of acute infiltrate. A small, stable right pleural effusion is noted. No pneumothorax is seen. The heart size and mediastinal contours are within normal limits. Degenerative changes are present throughout the thoracic spine. IMPRESSION: 1. Small, stable right pleural effusion. Electronically Signed   By: Aram Candela M.D.   On: 11/07/2020 15:34   CT Head Wo Contrast  Result Date: 11/07/2020 CLINICAL DATA:  Weakness and lethargy. EXAM: CT HEAD WITHOUT CONTRAST TECHNIQUE: Contiguous axial images were obtained from the base of the skull through the vertex without intravenous contrast. COMPARISON:  September 20, 2020 FINDINGS: Brain: There is mild cerebral atrophy with widening of the extra-axial spaces and ventricular dilatation. There are areas of decreased attenuation within the white matter tracts of the supratentorial brain, consistent with microvascular disease changes. Vascular: No hyperdense vessel or unexpected calcification. Skull: Normal. Negative for fracture or focal lesion. Sinuses/Orbits: No acute finding. Other: None. IMPRESSION: 1. Generalized cerebral atrophy. 2. No acute intracranial abnormality. Electronically Signed   By: Aram Candela M.D.   On: 11/07/2020 15:59    My personal review of EKG: Rhythm NSR, Rate  88 /min, QTc 489 Sinus or ectopic atrial rhythm Prolonged PR interval Nonspecific intraventricular conduction delay Borderline repolarization abnorm  Assessment & Plan:    Active Problems:   Nonischemic cardiomyopathy (HCC)   Essential hypertension   Hyperlipidemia   PAF (paroxysmal atrial fibrillation)  (HCC)   ICD (implantable cardioverter-defibrillator) in place   Diabetes mellitus type 2  in obese (HCC)   Lactic acidosis   Chronic respiratory failure with hypoxia (HCC)   Acute renal failure superimposed on stage 3 chronic kidney disease (HCC)   Sepsis (HCC)  Sepsis -This present on admission, altered mental status, soft blood pressure, with tachypnea and elevated lactic acid. -Source of sepsis most loculated to colitis, and UTI as well. -Follow on urine cultures, blood cultures, meanwhile continue with the broad-spectrum antibiotic including vancomycin and cefepime, will add Flagyl to cover for colitis. -Continue with IV fluids, will use cautiously given his known EF and pleural effusion. -Admitted under sepsis pathway. -We will start on hydrocortisone stress dose.  AKI in CKD stage IIIb -In the setting of volume depletion and dehydration, avoid nephrotoxic medication, continue with IV fluids,  Acute metabolic encephalopathy -Multifactorial in the setting of sepsis, severe dehydration and hypernatremia.  Hypernatremia -Continue with half-normal saline.  Blood loss anemia/GI bleed -Patient with melena, hemoglobin of 6.8, will transfuse 2 units, will start on a Protonix drip, and will place consult for GI and will hold Eliquis.  Essential hypertension/Chronic systolic heart failure -Echocardiogram September 21 showed an EF of 20-25%, AICD in place -Setting of sepsis, renal failure and soft blood pressure will hold all diuretics.  Paroxysmal atrial fibrillation -We will hold amiodarone and  Eliquis  Hypothyroidism -Resume Synthroid when able to take oral, if not will need IV Synthroid in 72 hours  Hyperlipidemia -resume statin when able to take oral  Moderate malnutrition with questionable dysphagia -Significantly altered, have SLP to evaluate when improved. -Wife did mention SNF were planning to evaluate for PEG insertion for poor oral intake and appetite.  DVT  Prophylaxis  SCDs   AM Labs Ordered, also please review Full Orders  Family Communication: Admission, patients condition and plan of care including tests being ordered have been discussed with the wife who indicate understanding and agree with the plan and Code Status.  Code Status Full(D/W wife)  Likely DC to  Back to SNF  Condition GUARDED  Consults called: GI will be in Epic.    Admission status: inpatient    Time spent in minutes : 65 minutes   Huey Bienenstock M.D on 11/07/2020 at 5:58 PM   [facility] - Office  [phone]

## 2020-11-07 NOTE — ED Notes (Signed)
Blood stool noted, Occult positive, Dr Deretha Emory notified.

## 2020-11-07 NOTE — Sepsis Progress Note (Signed)
Notified bedside nurse of need to draw repeat lactic acid at 1727.

## 2020-11-07 NOTE — ED Provider Notes (Addendum)
[facility] EMERGENCY DEPARTMENT Provider Note   CSN: [id number] Arrival date & time: 11/07/20  1256     History Chief Complaint  Patient presents with  . Failure To Thrive    Ryan Macdonald is a 86 y.o. male.  Patient sent in from [facility].  For increasing weakness lethargy and decreased oral intake.  Patient not very responsive here at all.  Most recent admission was October 29 for epigastric abdominal pain.  Note that patient's stool here is melanotic.  Past medical history is significant for acute respiratory failure acute kidney injury atrial fibrillation pneumonia cardiomyopathy.  Patient is a full code.        Past Medical History:  Diagnosis Date  . Acute bronchitis 05/10/2017  . Acute respiratory failure (HCC)   . Acute respiratory failure with hypoxia (HCC) 09/27/2017  . AKI (acute kidney injury) (HCC) 02/2019  . AKI (acute kidney injury) (HCC) 03/14/2019  . Atrial fibrillation (HCC)   . CAP (community acquired pneumonia) 02/03/2017  . Cardiomyopathy (HCC)   . Cat scratch fever    removed mass on right side neck  . Cataracts, bilateral   . CHF (congestive heart failure) (HCC)   . CHF exacerbation (HCC) 09/27/2017  . Chronic kidney disease   . Corns and callosities   . Diabetes mellitus without complication (HCC)    TYPE 2  . Dyspnea   . Erythema intertrigo   . Flat foot   . Gout   . High cholesterol   . Hx of urethral stricture   . Hypertension   . Kidney stones   . Morbid obesity (HCC)   . Nonischemic cardiomyopathy (HCC)    a. ? 2009 Cath in [place] - nl cors per pt;  b. 09/2013 Echo: EF 25-30%, sev diff HK.  . Obesity   . Osteoarthritis   . PAF (paroxysmal atrial fibrillation) (HCC)    a. post-op hip in 2014 - prev on xarelto.  . Renal insufficiency     Patient Active Problem List   Diagnosis Date Noted  . Diabetes mellitus type 2, controlled, with complications (HCC) 10/09/2020  . Abdominal pain 09/26/2020  . Pancreatitis 09/26/2020  .  Open wound of left heel 09/26/2020  . Protein-calorie malnutrition, severe (HCC) 09/23/2020  . Sepsis (HCC) 08/16/2020  . Hematuria 02/15/2020  . Constipation 02/13/2020  . Anemia of chronic disease 02/13/2020  . Secondary hypercoagulable state (HCC) 01/16/2020  . Acute renal failure superimposed on stage 3 chronic kidney disease (HCC) 03/09/2019  . Transaminitis   . Chronic respiratory failure with hypoxia (HCC)   . Morbid obesity (HCC) 09/09/2018  . UTI (urinary tract infection) 08/31/2018  . First degree AV block   . Left knee pain 07/08/2017  . Elevated troponin 04/14/2017  . Lactic acidosis   . Diabetes mellitus type 2 in obese (HCC) 02/03/2017  . Lobar pneumonia (HCC)   . Diabetes mellitus with complication (HCC)   . Depression 05/19/2016  . Gout 05/19/2016  . 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 NSVT (nonsustained ventricular tachycardia) (HCC)   . Chronic renal insufficiency, stage 3 (moderate) (HCC)   . Hyperkalemia 08/21/2015  . Pulmonary embolism (HCC)   . Osteoarthritis of left hip 10/25/2013  . PAF (paroxysmal atrial fibrillation) (HCC) 10/25/2013  . S/P hip replacement 10/24/2013  . Nonischemic cardiomyopathy (HCC)   . Obesity (BMI 30-39.9)   . Essential hypertension   . Hyperlipidemia     Past Surgical History:  Procedure Laterality Date  .  CARDIAC CATHETERIZATION  1980's  . CARDIOVERSION N/A 01/15/2020   Procedure: CARDIOVERSION;  Surgeon: Laurey Morale, MD;  Location: [facility] ENDOSCOPY;  Service: Cardiovascular;  Laterality: N/A;  . CIRCUMCISION  1940's  . CORONARY STENT INTERVENTION N/A 08/18/2018   Procedure: CORONARY STENT INTERVENTION;  Surgeon: Corky Crafts, MD;  Location: [facility] INVASIVE CV LAB;  Service: Cardiovascular;  Laterality: N/A;  . CYSTOSCOPY WITH URETHRAL DILATATION  10/23/2013  . CYSTOSCOPY WITH URETHRAL DILATATION N/A 10/23/2013   Procedure: CYSTOSCOPY WITH URETHRAL DILATATION;  Surgeon: Kathi Ludwig, MD;  Location: [facility] OR;  Service: Urology;  Laterality: N/A;  . INCISION AND DRAINAGE OF WOUND Right 1980's   "cat scratch" (10/23/2013)  . INGUINAL HERNIA REPAIR Right 1950's  . INTRAVASCULAR ULTRASOUND/IVUS N/A 08/18/2018   Procedure: Intravascular Ultrasound/IVUS;  Surgeon: Corky Crafts, MD;  Location: [facility] INVASIVE CV LAB;  Service: Cardiovascular;  Laterality: N/A;  . KIDNEY STONE SURGERY Right 1970's; 1983   "twice"  . LEFT HEART CATH AND CORONARY ANGIOGRAPHY N/A 08/18/2018   Procedure: LEFT HEART CATH AND CORONARY ANGIOGRAPHY;  Surgeon: Corky Crafts, MD;  Location: [facility] INVASIVE CV LAB;  Service: Cardiovascular;  Laterality: N/A;  . RIGHT HEART CATH N/A 02/19/2020   Procedure: RIGHT HEART CATH;  Surgeon: Laurey Morale, MD;  Location: [facility] INVASIVE CV LAB;  Service: Cardiovascular;  Laterality: N/A;  . TEE WITHOUT CARDIOVERSION N/A 01/15/2020   Procedure: TRANSESOPHAGEAL ECHOCARDIOGRAM (TEE);  Surgeon: Laurey Morale, MD;  Location: [facility] ENDOSCOPY;  Service: Cardiovascular;  Laterality: N/A;  . TONSILLECTOMY AND ADENOIDECTOMY  1940's  . TOTAL HIP ARTHROPLASTY Left 10/23/2013   Procedure: TOTAL HIP ARTHROPLASTY;  Surgeon: Valeria Batman, MD;  Location: [facility] OR;  Service: Orthopedics;  Laterality: Left;       Family History  Problem Relation Age of Onset  . Heart disease Mother   . Heart Problems Maternal Grandmother   . Other Other        negative for premature CAD    Social History   Tobacco Use  . Smoking status: Former Smoker    Packs/day: 1.00    Years: 15.00    Pack years: 15.00    Types: Cigarettes, Cigars  . Smokeless tobacco: Former Neurosurgeon  . Tobacco comment: quit smoking ~ 50 yr ago  Vaping Use  . Vaping Use: Never used  Substance Use Topics  . Alcohol use: No    Alcohol/week: 2.0 standard drinks    Types: 2 Cans of beer per week    Comment: rare beer.  . Drug use: No    Home Medications Prior to Admission medications   Medication Sig  Start Date End Date Taking? Authorizing Provider  acetaminophen (TYLENOL) 325 MG tablet Take 650 mg by mouth every 6 (six) hours as needed.   Yes [provider]  apixaban (ELIQUIS) 2.5 MG TABS tablet Take 1 tablet (2.5 mg total) by mouth 2 (two) times daily. 03/27/20  Yes Laurey Morale, MD  Ascorbic Acid (VITAMIN C) 500 MG CAPS Take 500 mg by mouth in the morning and at bedtime.   Yes [provider]  diclofenac Sodium (VOLTAREN) 1 % GEL Apply 2 g topically 4 (four) times daily.   Yes [provider]  dronabinol (MARINOL) 5 MG capsule Take 1 capsule (5 mg total) by mouth 2 (two) times daily before lunch and supper. 10/12/20  Yes Drema Dallas, MD  HYDROcodone-acetaminophen (NORCO/VICODIN) 5-325 MG tablet Take 1 tablet by mouth 3 (three) times daily  as needed for severe pain. 08/21/20  Yes Osvaldo Shipper, MD  lidocaine (LIDODERM) 5 % Place 1 patch onto the skin daily. Remove & Discard patch within 12 hours or as directed by MD 08/22/20  Yes Osvaldo Shipper, MD  ondansetron (ZOFRAN) 4 MG tablet Take 1 tablet (4 mg total) by mouth every 6 (six) hours as needed for nausea. 10/12/20  Yes Drema Dallas, MD  polyethylene glycol (MIRALAX / GLYCOLAX) 17 g packet Take 17 g by mouth 2 (two) times daily. 02/24/20  Yes Sheikh, Omair Latif, DO  Polysaccharide Iron Complex (POLY-IRON 150 PO) Take 1 capsule by mouth in the morning.   Yes [provider]  saccharomyces boulardii (FLORASTOR) 250 MG capsule Take 1 capsule (250 mg total) by mouth 2 (two) times daily. 08/21/20  Yes Osvaldo Shipper, MD  sertraline (ZOLOFT) 25 MG tablet Take 25 mg by mouth daily.   Yes [provider]  spironolactone (ALDACTONE) 25 MG tablet Take 0.5 tablets (12.5 mg total) by mouth at bedtime. 10/12/20  Yes Drema Dallas, MD  traZODone (DESYREL) 50 MG tablet Take 0.5 tablets (25 mg total) by mouth at bedtime as needed for sleep. 10/12/20  Yes Drema Dallas, MD  Zinc 50 MG CAPS Take 50 mg  by mouth daily.   Yes [provider]  amiodarone (PACERONE) 100 MG tablet Take 1 tablet (100 mg total) by mouth daily. Patient not taking: No sig reported 10/12/20   Drema Dallas, MD  benzonatate (TESSALON) 100 MG capsule Take 1 capsule (100 mg total) by mouth 3 (three) times daily as needed for cough. Patient not taking: No sig reported 08/21/20   Osvaldo Shipper, MD  cyanocobalamin 1000 MCG tablet Take 1 tablet (1,000 mcg total) by mouth daily. Patient not taking: No sig reported 03/13/19   Laurey Morale, MD  feeding supplement (ENSURE ENLIVE / ENSURE PLUS) LIQD Take 237 mLs by mouth 2 (two) times daily between meals. 09/25/20   Mikhail, Nita Sells, DO  finasteride (PROSCAR) 5 MG tablet Take 1 tablet (5 mg total) by mouth daily. Patient not taking: No sig reported 11/29/18   Alford Highland, NP  levothyroxine (SYNTHROID) 125 MCG tablet Take 1 tablet (125 mcg total) by mouth daily before breakfast. Patient not taking: No sig reported 08/01/19   Carlus Pavlov, MD  magnesium oxide (MAG-OX) 400 MG tablet Take 400 mg by mouth daily.     [provider]  Multiple Vitamin (MULTIVITAMIN WITH MINERALS) TABS tablet Take 1 tablet by mouth daily. Patient not taking: No sig reported 09/26/20   Edsel Petrin, DO  rosuvastatin (CRESTOR) 10 MG tablet Take 1 tablet (10 mg total) by mouth daily at 6 PM. Patient not taking: No sig reported 12/22/18   [name], MD    Allergies    Lisinopril and Xarelto [rivaroxaban]  Review of Systems   Review of Systems  Unable to perform ROS: Mental status change    Physical Exam Updated Vital Signs BP (!) 102/51   Pulse 80   Temp 98.1 F (36.7 C) (Oral)   Resp (!) 24   Ht 1.702 m (5\' 7" )   Wt 85.3 kg   SpO2 97%   BMI 29.44 kg/m   Physical Exam Vitals and nursing note reviewed.  Constitutional:      Appearance: He is well-developed and well-nourished.  HENT:     Head: Normocephalic and atraumatic.  Eyes:     Extraocular  Movements: Extraocular movements intact.  Conjunctiva/sclera: Conjunctivae normal.     Pupils: Pupils are equal, round, and reactive to light.  Cardiovascular:     Rate and Rhythm: Normal rate and regular rhythm.     Heart sounds: No murmur heard.   Pulmonary:     Effort: Pulmonary effort is normal. No respiratory distress.     Breath sounds: Normal breath sounds. No wheezing.  Abdominal:     Palpations: Abdomen is soft.     Tenderness: There is no abdominal tenderness.  Genitourinary:    Rectum: Guaiac result positive.  Musculoskeletal:        General: No edema.     Cervical back: Neck supple.  Skin:    General: Skin is warm and dry.  Neurological:     Comments: Patient with decreased mental status.  Will move his head back and forth.  But nonverbal.  Movement of the upper extremities.  No movement of the lower extremities.  But they are padded.  Psychiatric:        Mood and Affect: Mood and affect normal.     ED Results / Procedures / Treatments   Labs (all labs ordered are listed, but only abnormal results are displayed) Labs Reviewed  CBG MONITORING, ED - Abnormal; Notable for the following components:      Result Value   Glucose-Capillary 118 (*)    All other components within normal limits  POC OCCULT BLOOD, ED - Abnormal; Notable for the following components:   Fecal Occult Bld POSITIVE (*)    All other components within normal limits  CULTURE, BLOOD (ROUTINE X 2)  CULTURE, BLOOD (ROUTINE X 2)  URINE CULTURE  RESP PANEL BY RT-PCR (FLU A&B, COVID) ARPGX2  LACTIC ACID, PLASMA  URINALYSIS, ROUTINE W REFLEX MICROSCOPIC  COMPREHENSIVE METABOLIC PANEL  CBC WITH DIFFERENTIAL/PLATELET  BRAIN NATRIURETIC PEPTIDE    EKG EKG Interpretation  Date/Time:  Friday November 07 2020 14:17:25 EST Ventricular Rate:  88 PR Interval:    QRS Duration: 123 QT Interval:  404 QTC Calculation: 489 R Axis:   24 Text Interpretation: Sinus or ectopic atrial rhythm Prolonged PR  interval Nonspecific intraventricular conduction delay Borderline repolarization abnormality Baseline wander in lead(s) V3 No significant change since last tracing Confirmed by Vanetta Mulders [phone]) on 11/07/2020 2:23:40 PM   Radiology No results found.  Procedures Procedures (including critical care time)  Medications Ordered in ED Medications  0.9 %  sodium chloride infusion ( Intravenous New Bag/Given 11/07/20 1503)  sodium chloride 0.9 % bolus 500 mL (0 mLs Intravenous Stopped 11/07/20 1503)    ED Course  I have reviewed the triage vital signs and the nursing notes.  Pertinent labs & imaging results that were available during my care of the patient were reviewed by me and considered in my medical decision making (see chart for details).    MDM Rules/Calculators/A&P                          Patient's blood sugar is fine.  Hemoccult is positive.  Patient's vital signs here are normal.  But blood pressure is systolic 99.  Not tachycardic not febrile.  Altered mental status work-up in place.  But does appear to have evidence of GI bleed.  So we will go ahead and add on CT scan of the abdomen as well.  Patient will be turned over to evening physician to follow-up on the labs.  Patient will most likely require admission.  Final Clinical Impression(s) / ED Diagnoses Final diagnoses:  Altered mental status, unspecified altered mental status type  Melena    Rx / DC Orders ED Discharge Orders    None       Vanetta Mulders, MD 11/07/20 1505    Vanetta Mulders, MD 11/07/20 [phone]

## 2020-11-08 DIAGNOSIS — I428 Other cardiomyopathies: Secondary | ICD-10-CM

## 2020-11-08 DIAGNOSIS — J9611 Chronic respiratory failure with hypoxia: Secondary | ICD-10-CM

## 2020-11-08 DIAGNOSIS — N183 Chronic kidney disease, stage 3 unspecified: Secondary | ICD-10-CM

## 2020-11-08 DIAGNOSIS — I10 Essential (primary) hypertension: Secondary | ICD-10-CM

## 2020-11-08 DIAGNOSIS — E43 Unspecified severe protein-calorie malnutrition: Secondary | ICD-10-CM

## 2020-11-08 DIAGNOSIS — N179 Acute kidney failure, unspecified: Secondary | ICD-10-CM

## 2020-11-08 DIAGNOSIS — E872 Acidosis: Secondary | ICD-10-CM

## 2020-11-08 DIAGNOSIS — I480 Paroxysmal atrial fibrillation: Secondary | ICD-10-CM

## 2020-11-08 LAB — CBC
HCT: 23.7 % — ABNORMAL LOW (ref 39.0–52.0)
Hemoglobin: 7.5 g/dL — ABNORMAL LOW (ref 13.0–17.0)
MCH: 33.6 pg (ref 26.0–34.0)
MCHC: 31.6 g/dL (ref 30.0–36.0)
MCV: 106.3 fL — ABNORMAL HIGH (ref 80.0–100.0)
Platelets: 157 10*3/uL (ref 150–400)
RBC: 2.23 MIL/uL — ABNORMAL LOW (ref 4.22–5.81)
RDW: 22.4 % — ABNORMAL HIGH (ref 11.5–15.5)
WBC: 8 10*3/uL (ref 4.0–10.5)
nRBC: 0.5 % — ABNORMAL HIGH (ref 0.0–0.2)

## 2020-11-08 LAB — COMPREHENSIVE METABOLIC PANEL
ALT: 27 U/L (ref 0–44)
AST: 59 U/L — ABNORMAL HIGH (ref 15–41)
Albumin: 2 g/dL — ABNORMAL LOW (ref 3.5–5.0)
Alkaline Phosphatase: 73 U/L (ref 38–126)
Anion gap: 10 (ref 5–15)
BUN: 86 mg/dL — ABNORMAL HIGH (ref 8–23)
CO2: 18 mmol/L — ABNORMAL LOW (ref 22–32)
Calcium: 8.1 mg/dL — ABNORMAL LOW (ref 8.9–10.3)
Chloride: 127 mmol/L — ABNORMAL HIGH (ref 98–111)
Creatinine, Ser: 3.24 mg/dL — ABNORMAL HIGH (ref 0.61–1.24)
GFR, Estimated: 18 mL/min — ABNORMAL LOW (ref >60)
Glucose, Bld: 123 mg/dL — ABNORMAL HIGH (ref 70–99)
Potassium: 4.5 mmol/L (ref 3.5–5.1)
Sodium: 155 mmol/L — ABNORMAL HIGH (ref 135–145)
Total Bilirubin: 1.7 mg/dL — ABNORMAL HIGH (ref 0.3–1.2)
Total Protein: 6.4 g/dL — ABNORMAL LOW (ref 6.5–8.1)

## 2020-11-08 LAB — PROCALCITONIN: Procalcitonin: 0.29 ng/mL

## 2020-11-08 LAB — PREPARE RBC (CROSSMATCH)

## 2020-11-08 LAB — PROTIME-INR
INR: 1.9 — ABNORMAL HIGH (ref 0.8–1.2)
Prothrombin Time: 21.5 seconds — ABNORMAL HIGH (ref 11.4–15.2)

## 2020-11-08 LAB — MRSA PCR SCREENING: MRSA by PCR: POSITIVE — AB

## 2020-11-08 MED ORDER — CHLORHEXIDINE GLUCONATE CLOTH 2 % EX PADS
6.0000 | MEDICATED_PAD | Freq: Every day | CUTANEOUS | Status: AC
  Administered 2020-11-09 – 2020-11-12 (×3): 6 via TOPICAL

## 2020-11-08 MED ORDER — MUPIROCIN 2 % EX OINT
1.0000 "application " | TOPICAL_OINTMENT | Freq: Two times a day (BID) | CUTANEOUS | Status: AC
  Administered 2020-11-08 – 2020-11-13 (×10): 1 via NASAL
  Filled 2020-11-08 (×4): qty 22

## 2020-11-08 MED ORDER — DEXTROSE 5 % IV SOLN: INTRAVENOUS | Status: DC

## 2020-11-08 MED ORDER — SODIUM CHLORIDE 0.9% IV SOLUTION: Freq: Once | INTRAVENOUS | Status: DC

## 2020-11-08 MED ORDER — CHLORHEXIDINE GLUCONATE CLOTH 2 % EX PADS
6.0000 | MEDICATED_PAD | Freq: Every day | CUTANEOUS | Status: DC
  Administered 2020-11-08: 14:00:00 6 via TOPICAL

## 2020-11-08 MED ORDER — ALBUMIN HUMAN 25 % IV SOLN
25.0000 g | Freq: Four times a day (QID) | INTRAVENOUS | Status: AC
  Administered 2020-11-08 – 2020-11-09 (×4): 25 g via INTRAVENOUS
  Filled 2020-11-08 (×4): qty 100

## 2020-11-08 NOTE — Progress Notes (Signed)
CRITICAL VALUE ALERT  Critical Value:  Positive MRSA  Date & Time Notied: 12/11 1835  Provider Notified: Dr. [name]  Orders Received/Actions taken: No new orders

## 2020-11-08 NOTE — Progress Notes (Signed)
PROGRESS NOTE    Ryan Macdonald  RFF:[id number] DOB: 1934/02/23 DOA: 11/07/2020 PCP: Sherol Dade, DO    Brief Narrative:  86 year old male with multiple medical problems including chronic systolic heart failure, EF of 20 to 25%, paroxysmal atrial fibrillation, chronic kidney disease stage IIIb, is brought to the emergency room with increasing lethargy and decreased p.o. intake.  He was noted to be severely anemic with a hemoglobin of 6.8, had acute kidney injury with a creatinine of 3.7 and a sodium of 157.  Patient was transfused PRBC and GI consulted.  Urinalysis indicates possible infection he is on antibiotics he is being rehydrated with IV fluids   Assessment & Plan:   Active Problems:   Nonischemic cardiomyopathy (HCC)   Essential hypertension   Hyperlipidemia   PAF (paroxysmal atrial fibrillation) (HCC)   ICD (implantable cardioverter-defibrillator) in place   Diabetes mellitus type 2 in obese (HCC)   Lactic acidosis   Chronic respiratory failure with hypoxia (HCC)   Acute renal failure superimposed on stage 3 chronic kidney disease (HCC)   Protein-calorie malnutrition, severe (HCC)   Acute blood loss anemia -Secondary to GI bleeding -Patient noted to have melena, FOBT positive -Hemoglobin 6.8 on admission -He was transfused 2 units of PRBC with improvement in hemoglobin to 7.5 -Since blood pressure remains low and he continues to have dark stools, will transfuse another unit of PRBCs  Acute kidney injury on chronic kidney disease stage IIIb -Likely related to volume depletion and dehydration -Continue IV fluids, monitor urine output  Hypotension -Suspect this is related to hypovolemia secondary to severe dehydration as well as GI bleeding -Continue to hold antihypertensives -Continue volume resuscitation -We will also use albumin infusions since he is severely hypoalbuminemic will likely third space IV fluids  Lactic acidosis -Related to  dehydration and hypovolemia -Continue IV fluid resuscitation  Paroxysmal atrial fibrillation -Chronically on amiodarone, restart once mental status will allow p.o. intake -Chronically on Eliquis, currently on hold due to concerns for GI bleeding  Hypernatremia -Related to severe dehydration and free water deficit -Continue hypotonic fluids  Urinary tract infection -Urinalysis indicates possible infection -Based on sepsis criteria on admission, sepsis has been ruled out -He is on intravenous antibiotics -Follow-up urine culture  Possible colitis -Noted on CT abdomen -Continue on cefepime and Flagyl  Acute metabolic encephalopathy -Suspect this is related to severe dehydration, hypernatremia -Mental status appears to have mildly improved since admission, although not back to baseline  Chronic systolic congestive heart failure/nonischemic cardiomyopathy -Ejection fraction of 20 to 25%, AICD in place -Holding diuretics in the setting of hypotension/renal failure -Continue to monitor volume status  Severe protein calorie malnutrition -P.o. intake has been poor -Nutrition consult  Hypothyroidism -Resume Synthroid once mental status has improved  Goals of care -Will attempt to address advanced directives with patient's wife -Will likely benefit from palliative care consult    DVT prophylaxis: SCDs Start: 11/07/20 2053  Code Status: full code Family Communication: left voicemail for patient's wife Disposition Plan: Status is: Inpatient  Remains inpatient appropriate because:Inpatient level of care appropriate due to severity of illness   Dispo: The patient is from: SNF              Anticipated d/c is to: SNF              Anticipated d/c date is: > 3 days              Patient currently is not medically stable to d/c.  Consultants:   Gastroenterology  Procedures:     Antimicrobials:   Cefepime 12/10>  Flagyl 12/10>  Vancomycin 12/10>12/11     Subjective: Patient wakes up to voice. He answers some questions with a yes or no. Denies any shortness of breath. Denies any abdominal pain  Objective: Vitals:   11/08/20 1200 11/08/20 1300 11/08/20 1400 11/08/20 1500  BP: (!) 99/53 (!) 94/48 (!) 102/38 (!) 100/35  Pulse: 60  (!) 55 (!) 55  Resp: Temp:      TempSrc:      SpO2: 100%  100% 99%  Weight:      Height:        Intake/Output Summary (Last 24 hours) at 11/08/2020 1606 Last data filed at 11/08/2020 1444 Gross per 24 hour  Intake 2820.51 ml  Output --  Net 2820.51 ml   Filed Weights   11/07/20 1319  Weight: 85.3 kg    Examination:  General exam: Appears calm and comfortable  Respiratory system: crackles at bases. Respiratory effort normal. Cardiovascular system: S1 & S2 heard, irregular. No JVD, murmurs, rubs, gallops or clicks. No pedal edema. Gastrointestinal system: Abdomen is nondistended, soft and nontender. No organomegaly or masses felt. Normal bowel sounds heard. Central nervous system: unable to assess due to mental status Extremities: bilateral feet in prevalon boots Psychiatry: unable to assess due to mental status    Data Reviewed: I have personally reviewed following labs and imaging studies  CBC: Recent Labs  Lab 11/07/20 1528 11/08/20 0950  WBC 10.6* 8.0  NEUTROABS 8.6*  --   HGB 6.8* 7.5*  HCT 22.2* 23.7*  MCV 111.0* 106.3*  PLT 266 157   Basic Metabolic Panel: Recent Labs  Lab 11/07/20 1528 11/08/20 0950  NA 157* 155*  K 4.4 4.5  CL 124* 127*  CO2 20* 18*  GLUCOSE 134* 123*  BUN 92* 86*  CREATININE 3.76* 3.24*  CALCIUM 9.2 8.1*   GFR: Estimated Creatinine Clearance: 17.7 mL/min (A) (by C-G formula based on SCr of 3.24 mg/dL (H)). Liver Function Tests: Recent Labs  Lab 11/07/20 1528 11/08/20 0950  AST 74* 59*  ALT 32 27  ALKPHOS 88 73  BILITOT 2.3* 1.7*  PROT 7.3 6.4*  ALBUMIN 2.2* 2.0*   No results for input(s): LIPASE, AMYLASE in the last 168  hours. No results for input(s): AMMONIA in the last 168 hours. Coagulation Profile: Recent Labs  Lab 11/08/20 0950  INR 1.9*   Cardiac Enzymes: No results for input(s): CKTOTAL, CKMB, CKMBINDEX, TROPONINI in the last 168 hours. BNP (last 3 results) No results for input(s): PROBNP in the last 8760 hours. HbA1C: No results for input(s): HGBA1C in the last 72 hours. CBG: Recent Labs  Lab 11/07/20 1418  GLUCAP 118*   Lipid Profile: No results for input(s): CHOL, HDL, LDLCALC, TRIG, CHOLHDL, LDLDIRECT in the last 72 hours. Thyroid Function Tests: No results for input(s): TSH, T4TOTAL, FREET4, T3FREE, THYROIDAB in the last 72 hours. Anemia Panel: No results for input(s): VITAMINB12, FOLATE, FERRITIN, TIBC, IRON, RETICCTPCT in the last 72 hours. Sepsis Labs: Recent Labs  Lab 11/07/20 1527 11/07/20 1735 11/08/20 0950  PROCALCITON  --   --  0.29  LATICACIDVEN 3.9* 2.6*  --     Recent Results (from the past 240 hour(s))  Resp Panel by RT-PCR (Flu A&B, Covid) Nasopharyngeal Swab     Status: None   Collection Time: 11/07/20  2:44 PM   Specimen: Nasopharyngeal Swab; Nasopharyngeal(NP) swabs in vial transport medium  Result Value Ref Range Status   SARS Coronavirus 2 by RT PCR NEGATIVE NEGATIVE Final    Comment: (NOTE) SARS-CoV-2 target nucleic acids are NOT DETECTED.  The SARS-CoV-2 RNA is generally detectable in upper respiratory specimens during the acute phase of infection. The lowest concentration of SARS-CoV-2 viral copies this assay can detect is 138 copies/mL. A negative result does not preclude SARS-Cov-2 infection and should not be used as the sole basis for treatment or other patient management decisions. A negative result may occur with  improper specimen collection/handling, submission of specimen other than nasopharyngeal swab, presence of viral mutation(s) within the areas targeted by this assay, and inadequate number of viral copies(<138 copies/mL). A negative  result must be combined with clinical observations, patient history, and epidemiological information. The expected result is Negative.  Fact Sheet for Patients:  BloggerCourse.com  Fact Sheet for Healthcare Providers:  SeriousBroker.it  This test is no t yet approved or cleared by the [place] FDA and  has been authorized for detection and/or diagnosis of SARS-CoV-2 by FDA under an Emergency Use Authorization (EUA). This EUA will remain  in effect (meaning this test can be used) for the duration of the COVID-19 declaration under Section 564(b)(1) of the Act, 21 U.S.C.section 360bbb-3(b)(1), unless the authorization is terminated  or revoked sooner.       Influenza A by PCR NEGATIVE NEGATIVE Final   Influenza B by PCR NEGATIVE NEGATIVE Final    Comment: (NOTE) The Xpert Xpress SARS-CoV-2/FLU/RSV plus assay is intended as an aid in the diagnosis of influenza from Nasopharyngeal swab specimens and should not be used as a sole basis for treatment. Nasal washings and aspirates are unacceptable for Xpert Xpress SARS-CoV-2/FLU/RSV testing.  Fact Sheet for Patients: BloggerCourse.com  Fact Sheet for Healthcare Providers: SeriousBroker.it  This test is not yet approved or cleared by the [place] FDA and has been authorized for detection and/or diagnosis of SARS-CoV-2 by FDA under an Emergency Use Authorization (EUA). This EUA will remain in effect (meaning this test can be used) for the duration of the COVID-19 declaration under Section 564(b)(1) of the Act, 21 U.S.C. section 360bbb-3(b)(1), unless the authorization is terminated or revoked.  Performed at [facility], [address] [place], [place] [address]   Culture, blood (Routine X 2) w Reflex to ID Panel     Status: None (Preliminary result)   Collection Time: 11/07/20  3:28 PM   Specimen: BLOOD  Result Value Ref  Range Status   Specimen Description BLOOD  Final   Special Requests NONE  Final   Culture   Final    NO GROWTH < 24 HOURS Performed at [facility], [address] [place], [place] [address]    Report Status PENDING  Incomplete  Culture, blood (Routine X 2) w Reflex to ID Panel     Status: None (Preliminary result)   Collection Time: 11/07/20  3:59 PM   Specimen: Left Antecubital; Blood  Result Value Ref Range Status   Specimen Description LEFT ANTECUBITAL  Final   Special Requests   Final    BOTTLES DRAWN AEROBIC AND ANAEROBIC Blood Culture adequate volume   Culture   Final    NO GROWTH < 24 HOURS Performed at [facility], [address] [place], [place] [address]    Report Status PENDING  Incomplete         Radiology Studies: CT ABDOMEN PELVIS WO CONTRAST  Result Date: 11/07/2020 CLINICAL DATA:  Melena with weakness and lethargy. EXAM:  CT ABDOMEN AND PELVIS WITHOUT CONTRAST TECHNIQUE: Multidetector CT imaging of the abdomen and pelvis was performed following the standard protocol without IV contrast. COMPARISON:  September 26, 2020 FINDINGS: Lower chest: Mild atelectasis and/or infiltrate is seen within the right lung base. There is a moderate sized right pleural effusion. A small left pleural effusion is also noted. Hepatobiliary: No focal liver abnormality is seen. No gallstones, gallbladder wall thickening, or biliary dilatation. Pancreas: Unremarkable. No pancreatic ductal dilatation or surrounding inflammatory changes. Spleen: Normal in size without focal abnormality. Adrenals/Urinary Tract: Adrenal glands are unremarkable. The left kidney is normal in size. Moderate severity diffuse cortical thinning with renal atrophy is seen on the right. A stable 1.6 cm diameter cyst is noted within the posterior aspect of the mid left kidney. A 2.7 mm staghorn calculus is seen on the right with additional innumerable subcentimeter non-obstructing renal stones present throughout the right  kidney. A moderate amount of air is seen within the calices of the right kidney and proximal right ureter. This represents a new finding when compared to the prior exam. No perinephric or periureteral inflammatory fat stranding is seen. A mild-to-moderate amount of air is seen within the lumen of a urinary bladder diverticulum. A small amount of air is seen within this region on the prior study. Stomach/Bowel: Stomach is within normal limits. The appendix is not clearly identified. No evidence of bowel dilatation. Mild to moderate severity thickening of the mid to distal sigmoid colon is seen (axial CT images 64 through 74, CT series number 2). Vascular/Lymphatic: Aortic atherosclerosis. No enlarged abdominal or pelvic lymph nodes. Reproductive: The prostate gland is poorly visualized secondary to overlying streak artifact. Other: A stable posterolateral right abdominal wall hernia is seen. No abdominopelvic ascites. Musculoskeletal: A chronic eleventh right rib fracture is seen. A left hip replacement is seen with associated streak artifact and subsequently limited evaluation of the adjacent osseous and soft tissue structures. Multilevel degenerative changes seen throughout the lumbar spine. IMPRESSION: 1. Mild to moderate severity colitis involving the mid to distal sigmoid colon. Follow-up to resolution is recommended to exclude the presence of an underlying neoplasm. 2. Moderate amount of calyceal air within the right kidney and proximal right ureter which the secondary to recent instrumentation. The presence of an underlying infectious process cannot be excluded. Correlation with urinalysis is recommended. 3. Bilateral pleural effusions, right greater than left. 4. Atrophic right kidney with extensive right-sided nephrolithiasis. 5. Left hip replacement. 6. Aortic atherosclerosis. Aortic Atherosclerosis (ICD10-I70.0). Electronically Signed   By: Aram Candela M.D.   On: 11/07/2020 17:25   DG Chest 1  View  Result Date: 11/07/2020 CLINICAL DATA:  Altered mental status. EXAM: CHEST  1 VIEW COMPARISON:  September 20, 2020 FINDINGS: A single lead ventricular pacer is in place. There is no evidence of acute infiltrate. A small, stable right pleural effusion is noted. No pneumothorax is seen. The heart size and mediastinal contours are within normal limits. Degenerative changes are present throughout the thoracic spine. IMPRESSION: 1. Small, stable right pleural effusion. Electronically Signed   By: Aram Candela M.D.   On: 11/07/2020 15:34   CT Head Wo Contrast  Result Date: 11/07/2020 CLINICAL DATA:  Weakness and lethargy. EXAM: CT HEAD WITHOUT CONTRAST TECHNIQUE: Contiguous axial images were obtained from the base of the skull through the vertex without intravenous contrast. COMPARISON:  September 20, 2020 FINDINGS: Brain: There is mild cerebral atrophy with widening of the extra-axial spaces and ventricular dilatation. There are areas of decreased  attenuation within the white matter tracts of the supratentorial brain, consistent with microvascular disease changes. Vascular: No hyperdense vessel or unexpected calcification. Skull: Normal. Negative for fracture or focal lesion. Sinuses/Orbits: No acute finding. Other: None. IMPRESSION: 1. Generalized cerebral atrophy. 2. No acute intracranial abnormality. Electronically Signed   By: Aram Candela M.D.   On: 11/07/2020 15:59        Scheduled Meds: . sodium chloride   Intravenous Once  . sodium chloride   Intravenous Once  . Chlorhexidine Gluconate Cloth  6 each Topical Daily  . hydrocortisone sod succinate (SOLU-CORTEF) inj  50 mg Intravenous Q6H  . [START ON 11/11/2020] pantoprazole  40 mg Intravenous Q12H   Continuous Infusions: . albumin human    . ceFEPime (MAXIPIME) IV    . dextrose 75 mL/hr at 11/08/20 1444  . metronidazole Stopped (11/08/20 1307)  . pantoprozole (PROTONIX) infusion 8 mg/hr (11/08/20 1444)     LOS: 1 day     Time spent:    Erick Blinks, MD [facility]   If 7PM-7AM, please contact night-coverage [email or web address]  11/08/2020, 4:06 PM

## 2020-11-08 NOTE — Consult Note (Signed)
Referring Provider: No ref. provider found Primary Care Physician:  Sherol Dade, DO Primary Gastroenterologist:  Dr. Jena Gauss  Reason for Consultation: GI bleed.  HPI: Patient is a 86 year old gentleman resident of [facility] with multiple medical problems including heart failure (AICD in place), atrial fibrillation on Eliquis chronic kidney disease stage IIIb, recent hospitalization for urosepsis brought to the hospital yesterday with a progressing fatigue/lethargy.  Essentially unresponsive in the ED.  Melena noted in ED.  Hemoglobin 6.8 on admission; 7.5 this morning after 2 units according to nursing staff (was 6.8  4 weeks ago).  3 weeks ago 9.7.  Dirty urinalysis.  Abnormal colon on CT He is being admitted with a diagnosis of sepsis and GI bleed   Soft blood pressures in the ED.  No hematemesis or hematochezia reported. I came to see him in ED room 4 with accompanying nursing staff.  No melena or other evidence of ongoing GI bleeding over the 20-hours he has remained in the ED.  Urinalysis indicative of infection.  Has some distal sigmoid wall thickening on CT with a generous amount of stool in this segment.  I spoke to patient's spouse, Dayle Mcnerney this morning.  Apparently, diminished oral intake for some time patient has been placed on Marinol.  More recently, some discussion about a PEG tube placement.  No history of prior EGD or colonoscopy.  No history of prior GI bleed. As far as Ms. Rajewski is aware, patient has not had any issues with bowel function recently-specifically unaware of any diarrhea.  He has been started on cefepime and IV Flagyl.  He was given a dose of IV vancomycin in the ED.   Marland Kitchen  Past Medical History:  Diagnosis Date  . Acute bronchitis 05/10/2017  . Acute respiratory failure (HCC)   . Acute respiratory failure with hypoxia (HCC) 09/27/2017  . AKI (acute kidney injury) (HCC) 02/2019  . AKI (acute kidney injury) (HCC) 03/14/2019  .  Atrial fibrillation (HCC)   . CAP (community acquired pneumonia) 02/03/2017  . Cardiomyopathy (HCC)   . Cat scratch fever    removed mass on right side neck  . Cataracts, bilateral   . CHF (congestive heart failure) (HCC)   . CHF exacerbation (HCC) 09/27/2017  . Chronic kidney disease   . Corns and callosities   . Diabetes mellitus without complication (HCC)    TYPE 2  . Dyspnea   . Erythema intertrigo   . Flat foot   . Gout   . High cholesterol   . Hx of urethral stricture   . Hypertension   . Kidney stones   . Morbid obesity (HCC)   . Nonischemic cardiomyopathy (HCC)    a. ? 2009 Cath in [place] - nl cors per pt;  b. 09/2013 Echo: EF 25-30%, sev diff HK.  . Obesity   . Osteoarthritis   . PAF (paroxysmal atrial fibrillation) (HCC)    a. post-op hip in 2014 - prev on xarelto.  . Renal insufficiency     Past Surgical History:  Procedure Laterality Date  . CARDIAC CATHETERIZATION  1980's  . CARDIOVERSION N/A 01/15/2020   Procedure: CARDIOVERSION;  Surgeon: Laurey Morale, MD;  Location: [facility] ENDOSCOPY;  Service: Cardiovascular;  Laterality: N/A;  . CIRCUMCISION  1940's  . CORONARY STENT INTERVENTION N/A 08/18/2018   Procedure: CORONARY STENT INTERVENTION;  Surgeon: Corky Crafts, MD;  Location: [facility] INVASIVE CV LAB;  Service: Cardiovascular;  Laterality: N/A;  . CYSTOSCOPY WITH URETHRAL DILATATION  10/23/2013  .  CYSTOSCOPY WITH URETHRAL DILATATION N/A 10/23/2013   Procedure: CYSTOSCOPY WITH URETHRAL DILATATION;  Surgeon: Kathi Ludwig, MD;  Location: [facility] OR;  Service: Urology;  Laterality: N/A;  . INCISION AND DRAINAGE OF WOUND Right 1980's   "cat scratch" (10/23/2013)  . INGUINAL HERNIA REPAIR Right 1950's  . INTRAVASCULAR ULTRASOUND/IVUS N/A 08/18/2018   Procedure: Intravascular Ultrasound/IVUS;  Surgeon: Corky Crafts, MD;  Location: [facility] INVASIVE CV LAB;  Service: Cardiovascular;  Laterality: N/A;  . KIDNEY STONE SURGERY Right 1970's; 1983   "twice"  . LEFT  HEART CATH AND CORONARY ANGIOGRAPHY N/A 08/18/2018   Procedure: LEFT HEART CATH AND CORONARY ANGIOGRAPHY;  Surgeon: Corky Crafts, MD;  Location: [facility] INVASIVE CV LAB;  Service: Cardiovascular;  Laterality: N/A;  . RIGHT HEART CATH N/A 02/19/2020   Procedure: RIGHT HEART CATH;  Surgeon: Laurey Morale, MD;  Location: [facility] INVASIVE CV LAB;  Service: Cardiovascular;  Laterality: N/A;  . TEE WITHOUT CARDIOVERSION N/A 01/15/2020   Procedure: TRANSESOPHAGEAL ECHOCARDIOGRAM (TEE);  Surgeon: Laurey Morale, MD;  Location: [facility] ENDOSCOPY;  Service: Cardiovascular;  Laterality: N/A;  . TONSILLECTOMY AND ADENOIDECTOMY  1940's  . TOTAL HIP ARTHROPLASTY Left 10/23/2013   Procedure: TOTAL HIP ARTHROPLASTY;  Surgeon: Valeria Batman, MD;  Location: [facility] OR;  Service: Orthopedics;  Laterality: Left;    Prior to Admission medications   Medication Sig Start Date End Date Taking? Authorizing Provider  acetaminophen (TYLENOL) 325 MG tablet Take 650 mg by mouth every 6 (six) hours as needed.   Yes [provider]  apixaban (ELIQUIS) 2.5 MG TABS tablet Take 1 tablet (2.5 mg total) by mouth 2 (two) times daily. 03/27/20  Yes Laurey Morale, MD  Ascorbic Acid (VITAMIN C) 500 MG CAPS Take 500 mg by mouth in the morning and at bedtime.   Yes [provider]  diclofenac Sodium (VOLTAREN) 1 % GEL Apply 2 g topically 4 (four) times daily.   Yes [provider]  dronabinol (MARINOL) 5 MG capsule Take 1 capsule (5 mg total) by mouth 2 (two) times daily before lunch and supper. 10/12/20  Yes Drema Dallas, MD  HYDROcodone-acetaminophen (NORCO/VICODIN) 5-325 MG tablet Take 1 tablet by mouth 3 (three) times daily as needed for severe pain. 08/21/20  Yes Osvaldo Shipper, MD  lidocaine (LIDODERM) 5 % Place 1 patch onto the skin daily. Remove & Discard patch within 12 hours or as directed by MD 08/22/20  Yes Osvaldo Shipper, MD  ondansetron (ZOFRAN) 4 MG tablet Take 1 tablet (4 mg total) by mouth every  6 (six) hours as needed for nausea. 10/12/20  Yes Drema Dallas, MD  polyethylene glycol (MIRALAX / GLYCOLAX) 17 g packet Take 17 g by mouth 2 (two) times daily. 02/24/20  Yes Sheikh, Omair Latif, DO  Polysaccharide Iron Complex (POLY-IRON 150 PO) Take 1 capsule by mouth in the morning.   Yes [provider]  saccharomyces boulardii (FLORASTOR) 250 MG capsule Take 1 capsule (250 mg total) by mouth 2 (two) times daily. 08/21/20  Yes Osvaldo Shipper, MD  sertraline (ZOLOFT) 25 MG tablet Take 25 mg by mouth daily.   Yes [provider]  spironolactone (ALDACTONE) 25 MG tablet Take 0.5 tablets (12.5 mg total) by mouth at bedtime. 10/12/20  Yes Drema Dallas, MD  traZODone (DESYREL) 50 MG tablet Take 0.5 tablets (25 mg total) by mouth at bedtime as needed for sleep. 10/12/20  Yes Drema Dallas, MD  Zinc 50 MG CAPS Take 50 mg by  mouth daily.   Yes [provider]  amiodarone (PACERONE) 100 MG tablet Take 1 tablet (100 mg total) by mouth daily. Patient not taking: No sig reported 10/12/20   Drema Dallas, MD  benzonatate (TESSALON) 100 MG capsule Take 1 capsule (100 mg total) by mouth 3 (three) times daily as needed for cough. Patient not taking: No sig reported 08/21/20   Osvaldo Shipper, MD  cyanocobalamin 1000 MCG tablet Take 1 tablet (1,000 mcg total) by mouth daily. Patient not taking: No sig reported 03/13/19   Laurey Morale, MD  feeding supplement (ENSURE ENLIVE / ENSURE PLUS) LIQD Take 237 mLs by mouth 2 (two) times daily between meals. 09/25/20   Mikhail, Nita Sells, DO  finasteride (PROSCAR) 5 MG tablet Take 1 tablet (5 mg total) by mouth daily. Patient not taking: No sig reported 11/29/18   Alford Highland, NP  levothyroxine (SYNTHROID) 125 MCG tablet Take 1 tablet (125 mcg total) by mouth daily before breakfast. Patient not taking: No sig reported 08/01/19   Carlus Pavlov, MD  magnesium oxide (MAG-OX) 400 MG tablet Take 400 mg by mouth daily.     [provider]  Multiple Vitamin (MULTIVITAMIN WITH MINERALS) TABS tablet Take 1 tablet by mouth daily. Patient not taking: No sig reported 09/26/20   Edsel Petrin, DO  rosuvastatin (CRESTOR) 10 MG tablet Take 1 tablet (10 mg total) by mouth daily at 6 PM. Patient not taking: No sig reported 12/22/18   [name], MD    Current Facility-Administered Medications  Medication Dose Route Frequency Provider Last Rate Last Admin  . 0.45 % sodium chloride infusion   Intravenous Continuous Elgergawy, Leana Roe, MD 75 mL/hr at 11/07/20 2154 New Bag at 11/07/20 2154  . 0.9 %  sodium chloride infusion (Manually program via Guardrails IV Fluids)   Intravenous Once Elgergawy, Leana Roe, MD   Stopped at 11/07/20 1803  . ceFEPIme (MAXIPIME) 2 g in sodium chloride 0.9 % 100 mL IVPB  2 g Intravenous Q24H Elgergawy, Leana Roe, MD      . hydrocortisone sodium succinate (SOLU-CORTEF) 100 MG injection 50 mg  50 mg Intravenous Q6H Elgergawy, Leana Roe, MD   50 mg at 11/08/20 0318  . metroNIDAZOLE (FLAGYL) IVPB 500 mg  500 mg Intravenous Q8H Elgergawy, Leana Roe, MD   Stopped at 11/08/20 0745  . pantoprazole (PROTONIX) 80 mg in sodium chloride 0.9 % 100 mL (0.8 mg/mL) infusion  8 mg/hr Intravenous Continuous Elgergawy, Leana Roe, MD 10 mL/hr at 11/07/20 2253 8 mg/hr at 11/07/20 2253  . [START ON 11/11/2020] pantoprazole (PROTONIX) injection 40 mg  40 mg Intravenous Q12H Elgergawy, Leana Roe, MD      . Melene Muller ON 11/09/2020] vancomycin (VANCOCIN) IVPB 1000 mg/200 mL premix  1,000 mg Intravenous Q48H Elgergawy, Leana Roe, MD       Current Outpatient Medications  Medication Sig Dispense Refill  . acetaminophen (TYLENOL) 325 MG tablet Take 650 mg by mouth every 6 (six) hours as needed.    Marland Kitchen apixaban (ELIQUIS) 2.5 MG TABS tablet Take 1 tablet (2.5 mg total) by mouth 2 (two) times daily.    . Ascorbic Acid (VITAMIN C) 500 MG CAPS Take 500 mg by mouth in the morning and at bedtime.    . diclofenac Sodium (VOLTAREN) 1 %  GEL Apply 2 g topically 4 (four) times daily.    Marland Kitchen dronabinol (MARINOL) 5 MG capsule Take 1 capsule (5 mg total) by mouth 2 (two) times daily before lunch and  supper. 60 capsule 0  . HYDROcodone-acetaminophen (NORCO/VICODIN) 5-325 MG tablet Take 1 tablet by mouth 3 (three) times daily as needed for severe pain. 15 tablet 0  . lidocaine (LIDODERM) 5 % Place 1 patch onto the skin daily. Remove & Discard patch within 12 hours or as directed by MD 30 patch 0  . ondansetron (ZOFRAN) 4 MG tablet Take 1 tablet (4 mg total) by mouth every 6 (six) hours as needed for nausea. 20 tablet 0  . polyethylene glycol (MIRALAX / GLYCOLAX) 17 g packet Take 17 g by mouth 2 (two) times daily. 14 each 0  . Polysaccharide Iron Complex (POLY-IRON 150 PO) Take 1 capsule by mouth in the morning.    . saccharomyces boulardii (FLORASTOR) 250 MG capsule Take 1 capsule (250 mg total) by mouth 2 (two) times daily.    . sertraline (ZOLOFT) 25 MG tablet Take 25 mg by mouth daily.    Marland Kitchen spironolactone (ALDACTONE) 25 MG tablet Take 0.5 tablets (12.5 mg total) by mouth at bedtime. 30 tablet 0  . traZODone (DESYREL) 50 MG tablet Take 0.5 tablets (25 mg total) by mouth at bedtime as needed for sleep. 30 tablet 0  . Zinc 50 MG CAPS Take 50 mg by mouth daily.    Marland Kitchen amiodarone (PACERONE) 100 MG tablet Take 1 tablet (100 mg total) by mouth daily. (Patient not taking: No sig reported) 30 tablet 0  . benzonatate (TESSALON) 100 MG capsule Take 1 capsule (100 mg total) by mouth 3 (three) times daily as needed for cough. (Patient not taking: No sig reported) 20 capsule 0  . cyanocobalamin 1000 MCG tablet Take 1 tablet (1,000 mcg total) by mouth daily. (Patient not taking: No sig reported) 90 tablet 0  . feeding supplement (ENSURE ENLIVE / ENSURE PLUS) LIQD Take 237 mLs by mouth 2 (two) times daily between meals.    . finasteride (PROSCAR) 5 MG tablet Take 1 tablet (5 mg total) by mouth daily. (Patient not taking: No sig reported) 30 tablet 0  .  levothyroxine (SYNTHROID) 125 MCG tablet Take 1 tablet (125 mcg total) by mouth daily before breakfast. (Patient not taking: No sig reported) 90 tablet 1  . magnesium oxide (MAG-OX) 400 MG tablet Take 400 mg by mouth daily.     . Multiple Vitamin (MULTIVITAMIN WITH MINERALS) TABS tablet Take 1 tablet by mouth daily. (Patient not taking: No sig reported)    . rosuvastatin (CRESTOR) 10 MG tablet Take 1 tablet (10 mg total) by mouth daily at 6 PM. (Patient not taking: No sig reported) 30 tablet 5    Allergies as of 11/07/2020 - Review Complete 11/07/2020  Allergen Reaction Noted  . Lisinopril Swelling 10/11/2013  . Xarelto [rivaroxaban] Other (See Comments) 02/13/2018    Family History  Problem Relation Age of Onset  . Heart disease Mother   . Heart Problems Maternal Grandmother   . Other Other        negative for premature CAD    Social History   Socioeconomic History  . Marital status: Married    Spouse name: Not on file  . Number of children: Not on file  . Years of education: Not on file  . Highest education level: Not on file  Occupational History  . Occupation: Retired    Comment: Former Public house manager  Tobacco Use  . Smoking status: Former Smoker    Packs/day: 1.00    Years: 15.00    Pack years: 15.00    Types: Cigarettes, Cigars  .  Smokeless tobacco: Former Neurosurgeon  . Tobacco comment: quit smoking ~ 50 yr ago  Vaping Use  . Vaping Use: Never used  Substance and Sexual Activity  . Alcohol use: No    Alcohol/week: 2.0 standard drinks    Types: 2 Cans of beer per week    Comment: rare beer.  . Drug use: No  . Sexual activity: Yes  Other Topics Concern  . Not on file  Social History Narrative   Lives in [place] with wife. Moved from [place] (2013). Does not routinely exercise.   Social Determinants of Health   Financial Resource Strain: Not on file  Food Insecurity: Not on file  Transportation Needs: Not on file  Physical Activity: Not on file  Stress: Not on file   Social Connections: Not on file  Intimate Partner Violence: Not on file    Review of Systems: Unobtainable from the patient.  Physical Exam: Vital signs in last 24 hours: Temp:  [97.6 F (36.4 C)-98.1 F (36.7 C)] 97.7 F (36.5 C) (12/11 0000) Pulse Rate:  [55-89] 61 (12/11 0945) Resp:  [15-26] 17 (12/11 0945) BP: (82-104)/(42-66) 103/53 (12/11 0930) SpO2:  [96 %-100 %] 100 % (12/11 0945) Weight:  [85.3 kg] 85.3 kg (12/10 1319)   General:   None verbal. Abdomen: Nondistended.  Positive bowel sounds.  Soft without mass organomegaly Rectal: Done by EDP as above Intake/Output from previous day: 12/10 0701 - 12/11 0700 In: 1750 [I.V.:30; Blood:270; IV Piggyback:1450] Out: -  Intake/Output this shift: No intake/output data recorded.  Lab Results: Recent Labs    11/07/20 1528 11/08/20 0950  WBC 10.6* 8.0  HGB 6.8* 7.5*  HCT 22.2* 23.7*  PLT 266 157   BMET Recent Labs    11/07/20 1528  NA 157*  K 4.4  CL 124*  CO2 20*  GLUCOSE 134*  BUN 92*  CREATININE 3.76*  CALCIUM 9.2   LFT Recent Labs    11/07/20 1528  PROT 7.3  ALBUMIN 2.2*  AST 74*  ALT 32  ALKPHOS 88  BILITOT 2.3*   PT/INR Recent Labs    11/08/20 0950  LABPROT 21.5*  INR 1.9*   Hepatitis Panel No results for input(s): HEPBSAG, HCVAB, HEPAIGM, HEPBIGM in the last 72 hours. C-Diff No results for input(s): CDIFFTOX in the last 72 hours.  Studies/Results: CT ABDOMEN PELVIS WO CONTRAST  Result Date: 11/07/2020 CLINICAL DATA:  Melena with weakness and lethargy. EXAM: CT ABDOMEN AND PELVIS WITHOUT CONTRAST TECHNIQUE: Multidetector CT imaging of the abdomen and pelvis was performed following the standard protocol without IV contrast. COMPARISON:  September 26, 2020 FINDINGS: Lower chest: Mild atelectasis and/or infiltrate is seen within the right lung base. There is a moderate sized right pleural effusion. A small left pleural effusion is also noted. Hepatobiliary: No focal liver abnormality is  seen. No gallstones, gallbladder wall thickening, or biliary dilatation. Pancreas: Unremarkable. No pancreatic ductal dilatation or surrounding inflammatory changes. Spleen: Normal in size without focal abnormality. Adrenals/Urinary Tract: Adrenal glands are unremarkable. The left kidney is normal in size. Moderate severity diffuse cortical thinning with renal atrophy is seen on the right. A stable 1.6 cm diameter cyst is noted within the posterior aspect of the mid left kidney. A 2.7 mm staghorn calculus is seen on the right with additional innumerable subcentimeter non-obstructing renal stones present throughout the right kidney. A moderate amount of air is seen within the calices of the right kidney and proximal right ureter. This represents a new finding when compared  to the prior exam. No perinephric or periureteral inflammatory fat stranding is seen. A mild-to-moderate amount of air is seen within the lumen of a urinary bladder diverticulum. A small amount of air is seen within this region on the prior study. Stomach/Bowel: Stomach is within normal limits. The appendix is not clearly identified. No evidence of bowel dilatation. Mild to moderate severity thickening of the mid to distal sigmoid colon is seen (axial CT images 64 through 74, CT series number 2). Vascular/Lymphatic: Aortic atherosclerosis. No enlarged abdominal or pelvic lymph nodes. Reproductive: The prostate gland is poorly visualized secondary to overlying streak artifact. Other: A stable posterolateral right abdominal wall hernia is seen. No abdominopelvic ascites. Musculoskeletal: A chronic eleventh right rib fracture is seen. A left hip replacement is seen with associated streak artifact and subsequently limited evaluation of the adjacent osseous and soft tissue structures. Multilevel degenerative changes seen throughout the lumbar spine. IMPRESSION: 1. Mild to moderate severity colitis involving the mid to distal sigmoid colon. Follow-up to  resolution is recommended to exclude the presence of an underlying neoplasm. 2. Moderate amount of calyceal air within the right kidney and proximal right ureter which the secondary to recent instrumentation. The presence of an underlying infectious process cannot be excluded. Correlation with urinalysis is recommended. 3. Bilateral pleural effusions, right greater than left. 4. Atrophic right kidney with extensive right-sided nephrolithiasis. 5. Left hip replacement. 6. Aortic atherosclerosis. Aortic Atherosclerosis (ICD10-I70.0). Electronically Signed   By: Aram Candela M.D.   On: 11/07/2020 17:25   DG Chest 1 View  Result Date: 11/07/2020 CLINICAL DATA:  Altered mental status. EXAM: CHEST  1 VIEW COMPARISON:  September 20, 2020 FINDINGS: A single lead ventricular pacer is in place. There is no evidence of acute infiltrate. A small, stable right pleural effusion is noted. No pneumothorax is seen. The heart size and mediastinal contours are within normal limits. Degenerative changes are present throughout the thoracic spine. IMPRESSION: 1. Small, stable right pleural effusion. Electronically Signed   By: Aram Candela M.D.   On: 11/07/2020 15:34   CT Head Wo Contrast  Result Date: 11/07/2020 CLINICAL DATA:  Weakness and lethargy. EXAM: CT HEAD WITHOUT CONTRAST TECHNIQUE: Contiguous axial images were obtained from the base of the skull through the vertex without intravenous contrast. COMPARISON:  September 20, 2020 FINDINGS: Brain: There is mild cerebral atrophy with widening of the extra-axial spaces and ventricular dilatation. There are areas of decreased attenuation within the white matter tracts of the supratentorial brain, consistent with microvascular disease changes. Vascular: No hyperdense vessel or unexpected calcification. Skull: Normal. Negative for fracture or focal lesion. Sinuses/Orbits: No acute finding. Other: None. IMPRESSION: 1. Generalized cerebral atrophy. 2. No acute intracranial  abnormality. Electronically Signed   By: Aram Candela M.D.   On: 11/07/2020 15:59   Impression: 86 year old gentleman with multiple comorbidities including advanced chronic kidney disease, cardiomyopathy (AICD in place) admitted with sepsis and GI bleeding -chronically anticoagulated. He appears to have stabilized after 2 units of packed RBCs and clinically has had no further bleeding while in the ED for almost 24 hours now. I suspect etiology of infection is the urinary tract. He does have some nonspecific thickening of the distal sigmoid and rectum in association with generous stool burden.  This is a nonspecific finding.  No history of diarrhea.  No history of a prior colonoscopy. I suspect blood loss from his upper GI tract.  While his diminished oral intake recently is nonspecific, it could be a telltale sign  of ongoing upper GI disease.  Recommendations: Agree with holding Eliquis.  Follow H&H closely.  He may need another unit of packed RBCs Agree with PPI infusion. As discussed with his wife [name], investigation of GI bleed is indicated beginning with an EGD.  However, he is not a candidate for an upper endoscopy at this time. Infection needs to be controlled and Eliquis needs to washout. I anticipate EGD on 12/13.  The risks, benefits, limitations, alternatives and imponderables have been reviewed with the patient. Potential for esophageal dilation, biopsy, etc. have also been reviewed.  Questions have been answered.  She is agreeable.      Notice:  This dictation was prepared with Dragon dictation along with smaller phrase technology. Any transcriptional errors that result from this process are unintentional and may not be corrected upon review.

## 2020-11-08 NOTE — Clinical Social Work Note (Signed)
[facility] Emergency Notification complete. Notification ID is: [id number].

## 2020-11-09 DIAGNOSIS — L8990 Pressure ulcer of unspecified site, unspecified stage: Secondary | ICD-10-CM | POA: Insufficient documentation

## 2020-11-09 LAB — TYPE AND SCREEN
ABO/RH(D): O POS
Antibody Screen: NEGATIVE
Unit division: 0
Unit division: 0
Unit division: 0

## 2020-11-09 LAB — URINE CULTURE

## 2020-11-09 LAB — CBC
HCT: 28.3 % — ABNORMAL LOW (ref 39.0–52.0)
Hemoglobin: 8.5 g/dL — ABNORMAL LOW (ref 13.0–17.0)
MCH: 31.4 pg (ref 26.0–34.0)
MCHC: 30 g/dL (ref 30.0–36.0)
MCV: 104.4 fL — ABNORMAL HIGH (ref 80.0–100.0)
Platelets: 148 10*3/uL — ABNORMAL LOW (ref 150–400)
RBC: 2.71 MIL/uL — ABNORMAL LOW (ref 4.22–5.81)
RDW: 22.1 % — ABNORMAL HIGH (ref 11.5–15.5)
WBC: 9.7 10*3/uL (ref 4.0–10.5)
nRBC: 0.4 % — ABNORMAL HIGH (ref 0.0–0.2)

## 2020-11-09 LAB — COMPREHENSIVE METABOLIC PANEL
ALT: 22 U/L (ref 0–44)
AST: 53 U/L — ABNORMAL HIGH (ref 15–41)
Albumin: 2.9 g/dL — ABNORMAL LOW (ref 3.5–5.0)
Alkaline Phosphatase: 60 U/L (ref 38–126)
Anion gap: 12 (ref 5–15)
BUN: 83 mg/dL — ABNORMAL HIGH (ref 8–23)
CO2: 16 mmol/L — ABNORMAL LOW (ref 22–32)
Calcium: 8.3 mg/dL — ABNORMAL LOW (ref 8.9–10.3)
Chloride: 126 mmol/L — ABNORMAL HIGH (ref 98–111)
Creatinine, Ser: 3.1 mg/dL — ABNORMAL HIGH (ref 0.61–1.24)
GFR, Estimated: 19 mL/min — ABNORMAL LOW (ref >60)
Glucose, Bld: 129 mg/dL — ABNORMAL HIGH (ref 70–99)
Potassium: 3.5 mmol/L (ref 3.5–5.1)
Sodium: 154 mmol/L — ABNORMAL HIGH (ref 135–145)
Total Bilirubin: 1.8 mg/dL — ABNORMAL HIGH (ref 0.3–1.2)
Total Protein: 6.5 g/dL (ref 6.5–8.1)

## 2020-11-09 LAB — BPAM RBC
Blood Product Expiration Date: 202201082359
Blood Product Expiration Date: 202201152359
Blood Product Expiration Date: 202201182359
ISSUE DATE / TIME: 202112101809
ISSUE DATE / TIME: 202112102108
ISSUE DATE / TIME: 202112111917
Unit Type and Rh: 5100
Unit Type and Rh: 5100
Unit Type and Rh: 5100

## 2020-11-09 LAB — GLUCOSE, CAPILLARY: Glucose-Capillary: 113 mg/dL — ABNORMAL HIGH (ref 70–99)

## 2020-11-09 MED ORDER — SODIUM BICARBONATE 8.4 % IV SOLN
INTRAVENOUS | Status: DC
  Filled 2020-11-09 (×4): qty 1000

## 2020-11-09 NOTE — Progress Notes (Signed)
PROGRESS NOTE    Ryan Macdonald  HEN:[id number] DOB: [birth year] DOA: 11/07/2020 PCP: Sherol Dade, DO    Brief Narrative:  86 year old male with multiple medical problems including chronic systolic heart failure, EF of 20 to 25%, paroxysmal atrial fibrillation, chronic kidney disease stage IIIb, is brought to the emergency room with increasing lethargy and decreased p.o. intake.  He was noted to be severely anemic with a hemoglobin of 6.8, had acute kidney injury with a creatinine of 3.7 and a sodium of 157.  Patient was transfused PRBC and GI consulted.  Urinalysis indicates possible infection he is on antibiotics he is being rehydrated with IV fluids   Assessment & Plan:   Active Problems:   Nonischemic cardiomyopathy (HCC)   Essential hypertension   Hyperlipidemia   PAF (paroxysmal atrial fibrillation) (HCC)   ICD (implantable cardioverter-defibrillator) in place   Diabetes mellitus type 2 in obese (HCC)   Lactic acidosis   Chronic respiratory failure with hypoxia (HCC)   Acute renal failure superimposed on stage 3 chronic kidney disease (HCC)   Protein-calorie malnutrition, severe (HCC)   Pressure injury of skin   Acute blood loss anemia -Secondary to GI bleeding -Patient noted to have melena, FOBT positive -Hemoglobin 6.8 on admission -He has received a total of 3 units of PRBCs thus far with follow-up hemoglobin being 8.5 -He is still having dark stools and likely still has a component of hemoconcentration -Continue to follow hemoglobin and transfuse as needed -GI following and will assess for stability for EGD in a.m. -Continue on PPI infusion  Acute kidney injury on chronic kidney disease stage IIIb -Likely related to volume depletion and dehydration -No hydronephrosis reported on imaging -Continue IV fluids, monitor urine output  Hypotension -Suspect this is related to hypovolemia secondary to severe dehydration as well as GI bleeding -Continue to  hold antihypertensives -Continue volume resuscitation -He also received albumin infusions since he is severely hypoalbuminemic and would likely third space IV fluids  Lactic acidosis -Related to dehydration and hypovolemia -Continue IV fluid resuscitation  Paroxysmal atrial fibrillation -Chronically on amiodarone, restart once mental status will allow p.o. intake -Chronically on Eliquis, currently on hold due to concerns for GI bleeding  Hypernatremia -Related to severe dehydration and free water deficit -Continue hypotonic fluids  Urinary tract infection -Urinalysis indicates possible infection -Based on sepsis criteria on admission, sepsis has been ruled out -He is on intravenous antibiotics -Urine culture shows multiple species  Possible colitis -Noted on CT abdomen -Continue on cefepime and Flagyl  Acute metabolic encephalopathy -Suspect this is related to severe dehydration, hypernatremia -He is still lethargic and does not engage in conversation.  Does not appear to be back to baseline  Chronic systolic congestive heart failure/nonischemic cardiomyopathy -Ejection fraction of 20 to 25%, AICD in place -Holding diuretics in the setting of hypotension/renal failure -Continue to monitor volume status  Severe protein calorie malnutrition -P.o. intake has been poor -Nutrition consult  Hypothyroidism -Resume Synthroid if able to tolerate p.o. intake  Goals of care -Discussed advanced directives with patient's wife [name] -She has discussed CODE STATUS with patient's children and all are in agreement for DNR status -We will need to have further conversations regarding PEG tube placement and whether that is the right choice for the patient/how it may impact his quality of life -Palliative care consulted to help address goals of care    DVT prophylaxis: SCDs Start: 11/07/20 2053  Code Status: full code Family Communication: left voicemail for patient's  wife  Disposition Plan: Status is: Inpatient  Remains inpatient appropriate because:Inpatient level of care appropriate due to severity of illness   Dispo: The patient is from: SNF              Anticipated d/c is to: SNF              Anticipated d/c date is: > 3 days              Patient currently is not medically stable to d/c.    Consultants:   Gastroenterology  Procedures:     Antimicrobials:   Cefepime 12/10>  Flagyl 12/10>  Vancomycin 12/10>12/11    Subjective: Patient opens eyes to voice.  Denies any pain.  Denies any shortness of breath.  Staff does not report any significant volume output in rectal tube overnight  Objective: Vitals:   11/09/20 1406 11/09/20 1430 11/09/20 1500 11/09/20 1530  BP: (!) 94/43 (!) 94/47 (!) 96/45 (!) 95/41  Pulse: (!) 59 (!) 57 (!) 59 (!) 59  Resp: 17 16 19 17   Temp:      TempSrc:      SpO2: 99% 98% 99% 99%  Weight:      Height:        Intake/Output Summary (Last 24 hours) at 11/09/2020 1657 Last data filed at 11/09/2020 1525 Gross per 24 hour  Intake 1649.14 ml  Output 800 ml  Net 849.14 ml   Filed Weights   11/07/20 1319 11/09/20 0437  Weight: 85.3 kg 73.2 kg    Examination:  General exam: Sleeping, but wakes up to voice Respiratory system: Crackles at bases. Respiratory effort normal. Cardiovascular system:RRR. No murmurs, rubs, gallops. Gastrointestinal system: Abdomen is nondistended, soft and nontender. No organomegaly or masses felt. Normal bowel sounds heard. Central nervous system:  No focal neurological deficits. Extremities: No C/C/E, +pedal pulses Skin: No rashes, lesions or ulcers Psychiatry: unable to assess due to mental status    Data Reviewed: I have personally reviewed following labs and imaging studies  CBC: Recent Labs  Lab 11/07/20 1528 11/08/20 0950 11/09/20 0421  WBC 10.6* 8.0 9.7  NEUTROABS 8.6*  --   --   HGB 6.8* 7.5* 8.5*  HCT 22.2* 23.7* 28.3*  MCV 111.0* 106.3* 104.4*   PLT 266 157 148*   Basic Metabolic Panel: Recent Labs  Lab 11/07/20 1528 11/08/20 0950 11/09/20 0421  NA 157* 155* 154*  K 4.4 4.5 3.5  CL 124* 127* 126*  CO2 20* 18* 16*  GLUCOSE 134* 123* 129*  BUN 92* 86* 83*  CREATININE 3.76* 3.24* 3.10*  CALCIUM 9.2 8.1* 8.3*   GFR: Estimated Creatinine Clearance: 16.6 mL/min (A) (by C-G formula based on SCr of 3.1 mg/dL (H)). Liver Function Tests: Recent Labs  Lab 11/07/20 1528 11/08/20 0950 11/09/20 0421  AST 74* 59* 53*  ALT 32 27 22  ALKPHOS 88 73 60  BILITOT 2.3* 1.7* 1.8*  PROT 7.3 6.4* 6.5  ALBUMIN 2.2* 2.0* 2.9*   No results for input(s): LIPASE, AMYLASE in the last 168 hours. No results for input(s): AMMONIA in the last 168 hours. Coagulation Profile: Recent Labs  Lab 11/08/20 0950  INR 1.9*   Cardiac Enzymes: No results for input(s): CKTOTAL, CKMB, CKMBINDEX, TROPONINI in the last 168 hours. BNP (last 3 results) No results for input(s): PROBNP in the last 8760 hours. HbA1C: No results for input(s): HGBA1C in the last 72 hours. CBG: Recent Labs  Lab 11/07/20 1418 11/09/20 0735  GLUCAP 118* 113*  Lipid Profile: No results for input(s): CHOL, HDL, LDLCALC, TRIG, CHOLHDL, LDLDIRECT in the last 72 hours. Thyroid Function Tests: No results for input(s): TSH, T4TOTAL, FREET4, T3FREE, THYROIDAB in the last 72 hours. Anemia Panel: No results for input(s): VITAMINB12, FOLATE, FERRITIN, TIBC, IRON, RETICCTPCT in the last 72 hours. Sepsis Labs: Recent Labs  Lab 11/07/20 1527 11/07/20 1735 11/08/20 0950  PROCALCITON  --   --  0.29  LATICACIDVEN 3.9* 2.6*  --     Recent Results (from the past 240 hour(s))  Resp Panel by RT-PCR (Flu A&B, Covid) Nasopharyngeal Swab     Status: None   Collection Time: 11/07/20  2:44 PM   Specimen: Nasopharyngeal Swab; Nasopharyngeal(NP) swabs in vial transport medium  Result Value Ref Range Status   SARS Coronavirus 2 by RT PCR NEGATIVE NEGATIVE Final    Comment:  (NOTE) SARS-CoV-2 target nucleic acids are NOT DETECTED.  The SARS-CoV-2 RNA is generally detectable in upper respiratory specimens during the acute phase of infection. The lowest concentration of SARS-CoV-2 viral copies this assay can detect is 138 copies/mL. A negative result does not preclude SARS-Cov-2 infection and should not be used as the sole basis for treatment or other patient management decisions. A negative result may occur with  improper specimen collection/handling, submission of specimen other than nasopharyngeal swab, presence of viral mutation(s) within the areas targeted by this assay, and inadequate number of viral copies(<138 copies/mL). A negative result must be combined with clinical observations, patient history, and epidemiological information. The expected result is Negative.  Fact Sheet for Patients:  BloggerCourse.com  Fact Sheet for Healthcare Providers:  SeriousBroker.it  This test is no t yet approved or cleared by the [place] FDA and  has been authorized for detection and/or diagnosis of SARS-CoV-2 by FDA under an Emergency Use Authorization (EUA). This EUA will remain  in effect (meaning this test can be used) for the duration of the COVID-19 declaration under Section 564(b)(1) of the Act, 21 U.S.C.section 360bbb-3(b)(1), unless the authorization is terminated  or revoked sooner.       Influenza A by PCR NEGATIVE NEGATIVE Final   Influenza B by PCR NEGATIVE NEGATIVE Final    Comment: (NOTE) The Xpert Xpress SARS-CoV-2/FLU/RSV plus assay is intended as an aid in the diagnosis of influenza from Nasopharyngeal swab specimens and should not be used as a sole basis for treatment. Nasal washings and aspirates are unacceptable for Xpert Xpress SARS-CoV-2/FLU/RSV testing.  Fact Sheet for Patients: BloggerCourse.com  Fact Sheet for Healthcare  Providers: SeriousBroker.it  This test is not yet approved or cleared by the [place] FDA and has been authorized for detection and/or diagnosis of SARS-CoV-2 by FDA under an Emergency Use Authorization (EUA). This EUA will remain in effect (meaning this test can be used) for the duration of the COVID-19 declaration under Section 564(b)(1) of the Act, 21 U.S.C. section 360bbb-3(b)(1), unless the authorization is terminated or revoked.  Performed at [facility], [address], [place], [place] [address]   Culture, blood (Routine X 2) w Reflex to ID Panel     Status: None (Preliminary result)   Collection Time: 11/07/20  3:28 PM   Specimen: BLOOD RIGHT FOREARM  Result Value Ref Range Status   Specimen Description   Final    BLOOD RIGHT FOREARM BOTTLES DRAWN AEROBIC AND ANAEROBIC   Special Requests Blood Culture adequate volume  Final   Culture   Final    NO GROWTH 2 DAYS Performed at [facility], [address], [place], [place] [address]    Report Status PENDING  Incomplete  Culture, blood (Routine X 2) w Reflex to ID Panel     Status: None (Preliminary result)   Collection Time: 11/07/20  3:59 PM   Specimen: Left Antecubital; Blood  Result Value Ref Range Status   Specimen Description LEFT ANTECUBITAL  Final   Special Requests   Final    BOTTLES DRAWN AEROBIC AND ANAEROBIC Blood Culture adequate volume   Culture   Final    NO GROWTH 2 DAYS Performed at [facility], [address] [place], [place] [address]    Report Status PENDING  Incomplete  Urine Culture     Status: Abnormal   Collection Time: 11/07/20  5:00 PM   Specimen: Urine, Clean Catch  Result Value Ref Range Status   Specimen Description   Final    URINE, CLEAN CATCH Performed at [facility], [address], [place], [place] [address]    Special Requests   Final    NONE Performed at [facility], [address] [place], [place] [address]    Culture MULTIPLE SPECIES PRESENT,  SUGGEST RECOLLECTION (A)  Final   Report Status 11/09/2020 FINAL  Final  MRSA PCR Screening     Status: Abnormal   Collection Time: 11/08/20  4:00 PM   Specimen: Nasopharyngeal  Result Value Ref Range Status   MRSA by PCR POSITIVE (A) NEGATIVE Final    Comment:        The GeneXpert MRSA Assay (FDA approved for NASAL specimens only), is one component of a comprehensive MRSA colonization surveillance program. It is not intended to diagnose MRSA infection nor to guide or monitor treatment for MRSA infections. RESULT CALLED TO, READ BACK BY AND VERIFIED WITH: K LOOMIS AT 1836 ON 11/08/2020 BY MOSLEY,J Performed at [facility], [address] [place], [place] [address]          Radiology Studies: No results found.      Scheduled Meds: . sodium chloride   Intravenous Once  . sodium chloride   Intravenous Once  . Chlorhexidine Gluconate Cloth  6 each Topical Q0600  . mupirocin ointment  1 application Nasal BID  . [START ON 11/11/2020] pantoprazole  40 mg Intravenous Q12H   Continuous Infusions: . ceFEPime (MAXIPIME) IV 2 g (11/09/20 1532)  . dextrose 5 % 1,000 mL with potassium chloride 20 mEq, sodium bicarbonate 150 mEq infusion 75 mL/hr at 11/09/20 1525  . metronidazole Stopped (11/09/20 1053)  . pantoprozole (PROTONIX) infusion 8 mg/hr (11/09/20 1533)     LOS: 2 days    Time spent:    Erick Blinks, MD [facility]   If 7PM-7AM, please contact night-coverage [email or web address]  11/09/2020, 4:57 PM

## 2020-11-09 NOTE — Progress Notes (Signed)
Patient nonverbal  Vital signs in last 24 hours: Temp:  [96.4 F (35.8 C)-97.2 F (36.2 C)] 97.2 F (36.2 C) (12/12 0800) Pulse Rate:  [53-70] 58 (12/12 1130) Resp:  [13-27] 19 (12/12 1130) BP: (87-108)/(35-53) 97/43 (12/12 1130) SpO2:  [97 %-100 %] 99 % (12/12 1130) Weight:  [73.2 kg] 73.2 kg (12/12 0437) Last BM Date: 11/09/20 General:   Opens eyes to verbal stimulation. Abdomen: Nondistended.  Soft no masses     Intake/Output from previous day: 12/11 0701 - 12/12 0700 In: 2371.5 [I.V.:1642.2; Blood:352.5; IV Piggyback:376.8] Out: 800 [Urine:800] Intake/Output this shift: No intake/output data recorded.   Discussed with patient's nurse, [place].  He has had relatively small volume but persistent flow of pasty black stool over the past 24 hours  Hemoglobin 8.5 this morning after a total of 3 units transfused this admission   Lab Results: Recent Labs    11/07/20 1528 11/08/20 0950 11/09/20 0421  WBC 10.6* 8.0 9.7  HGB 6.8* 7.5* 8.5*  HCT 22.2* 23.7* 28.3*  PLT 266 157 148*   BMET Recent Labs    11/07/20 1528 11/08/20 0950 11/09/20 0421  NA 157* 155* 154*  K 4.4 4.5 3.5  CL 124* 127* 126*  CO2 20* 18* 16*  GLUCOSE 134* 123* 129*  BUN 92* 86* 83*  CREATININE 3.76* 3.24* 3.10*  CALCIUM 9.2 8.1* 8.3*   LFT Recent Labs    11/09/20 0421  PROT 6.5  ALBUMIN 2.9*  AST 53*  ALT 22  ALKPHOS 60  BILITOT 1.8*   PT/INR Recent Labs    11/08/20 0950  LABPROT 21.5*  INR 1.9*   Hepatitis Panel No results for input(s): HEPBSAG, HCVAB, HEPAIGM, HEPBIGM in the last 72 hours. C-Diff No results for input(s): CDIFFTOX in the last 72 hours.  Studies/Results: CT ABDOMEN PELVIS WO CONTRAST  Result Date: 11/07/2020 CLINICAL DATA:  Melena with weakness and lethargy. EXAM: CT ABDOMEN AND PELVIS WITHOUT CONTRAST TECHNIQUE: Multidetector CT imaging of the abdomen and pelvis was performed following the standard protocol without IV contrast. COMPARISON:  September 26, 2020 FINDINGS: Lower chest: Mild atelectasis and/or infiltrate is seen within the right lung base. There is a moderate sized right pleural effusion. A small left pleural effusion is also noted. Hepatobiliary: No focal liver abnormality is seen. No gallstones, gallbladder wall thickening, or biliary dilatation. Pancreas: Unremarkable. No pancreatic ductal dilatation or surrounding inflammatory changes. Spleen: Normal in size without focal abnormality. Adrenals/Urinary Tract: Adrenal glands are unremarkable. The left kidney is normal in size. Moderate severity diffuse cortical thinning with renal atrophy is seen on the right. A stable 1.6 cm diameter cyst is noted within the posterior aspect of the mid left kidney. A 2.7 mm staghorn calculus is seen on the right with additional innumerable subcentimeter non-obstructing renal stones present throughout the right kidney. A moderate amount of air is seen within the calices of the right kidney and proximal right ureter. This represents a new finding when compared to the prior exam. No perinephric or periureteral inflammatory fat stranding is seen. A mild-to-moderate amount of air is seen within the lumen of a urinary bladder diverticulum. A small amount of air is seen within this region on the prior study. Stomach/Bowel: Stomach is within normal limits. The appendix is not clearly identified. No evidence of bowel dilatation. Mild to moderate severity thickening of the mid to distal sigmoid colon is seen (axial CT images 64 through 74, CT series number 2). Vascular/Lymphatic: Aortic atherosclerosis. No enlarged abdominal or pelvic  lymph nodes. Reproductive: The prostate gland is poorly visualized secondary to overlying streak artifact. Other: A stable posterolateral right abdominal wall hernia is seen. No abdominopelvic ascites. Musculoskeletal: A chronic eleventh right rib fracture is seen. A left hip replacement is seen with associated streak artifact and subsequently  limited evaluation of the adjacent osseous and soft tissue structures. Multilevel degenerative changes seen throughout the lumbar spine. IMPRESSION: 1. Mild to moderate severity colitis involving the mid to distal sigmoid colon. Follow-up to resolution is recommended to exclude the presence of an underlying neoplasm. 2. Moderate amount of calyceal air within the right kidney and proximal right ureter which the secondary to recent instrumentation. The presence of an underlying infectious process cannot be excluded. Correlation with urinalysis is recommended. 3. Bilateral pleural effusions, right greater than left. 4. Atrophic right kidney with extensive right-sided nephrolithiasis. 5. Left hip replacement. 6. Aortic atherosclerosis. Aortic Atherosclerosis (ICD10-I70.0). Electronically Signed   By: Aram Candela M.D.   On: 11/07/2020 17:25   DG Chest 1 View  Result Date: 11/07/2020 CLINICAL DATA:  Altered mental status. EXAM: CHEST  1 VIEW COMPARISON:  September 20, 2020 FINDINGS: A single lead ventricular pacer is in place. There is no evidence of acute infiltrate. A small, stable right pleural effusion is noted. No pneumothorax is seen. The heart size and mediastinal contours are within normal limits. Degenerative changes are present throughout the thoracic spine. IMPRESSION: 1. Small, stable right pleural effusion. Electronically Signed   By: Aram Candela M.D.   On: 11/07/2020 15:34   CT Head Wo Contrast  Result Date: 11/07/2020 CLINICAL DATA:  Weakness and lethargy. EXAM: CT HEAD WITHOUT CONTRAST TECHNIQUE: Contiguous axial images were obtained from the base of the skull through the vertex without intravenous contrast. COMPARISON:  September 20, 2020 FINDINGS: Brain: There is mild cerebral atrophy with widening of the extra-axial spaces and ventricular dilatation. There are areas of decreased attenuation within the white matter tracts of the supratentorial brain, consistent with microvascular  disease changes. Vascular: No hyperdense vessel or unexpected calcification. Skull: Normal. Negative for fracture or focal lesion. Sinuses/Orbits: No acute finding. Other: None. IMPRESSION: 1. Generalized cerebral atrophy. 2. No acute intracranial abnormality. Electronically Signed   By: Aram Candela M.D.   On: 11/07/2020 15:59    Assessment: Active Problems:   Nonischemic cardiomyopathy (HCC)   Essential hypertension   Hyperlipidemia   PAF (paroxysmal atrial fibrillation) (HCC)   ICD (implantable cardioverter-defibrillator) in place   Diabetes mellitus type 2 in obese (HCC)   Lactic acidosis   Chronic respiratory failure with hypoxia (HCC)   Acute renal failure superimposed on stage 3 chronic kidney disease (HCC)   Protein-calorie malnutrition, severe (HCC)   Pressure injury of skin   Impression: 86 year old gentleman with numerous comorbidities including heart and renal failure admitted to the hospital with severe acute anemia, melena.  He has required 3 units of packed red blood cells so far. Suggestion of distal colitis on CT. Eliquis held.  He is still putting out black stool which is concerning for ongoing blood loss.  Recommendations: Continue PPI.  Continue to follow H&H.  We will reassess his candidacy for an EGD first thing tomorrow morning.

## 2020-11-10 ENCOUNTER — Inpatient Hospital Stay (HOSPITAL_COMMUNITY): Payer: No Typology Code available for payment source | Admitting: Certified Registered"

## 2020-11-10 ENCOUNTER — Encounter (HOSPITAL_COMMUNITY): Payer: Self-pay | Admitting: Internal Medicine

## 2020-11-10 ENCOUNTER — Encounter (HOSPITAL_COMMUNITY)
Admission: EM | Disposition: A | Discharge status: Hospice | Payer: Self-pay | Source: Skilled Nursing Facility | Attending: Internal Medicine

## 2020-11-10 DIAGNOSIS — K922 Gastrointestinal hemorrhage, unspecified: Secondary | ICD-10-CM

## 2020-11-10 DIAGNOSIS — K921 Melena: Secondary | ICD-10-CM

## 2020-11-10 HISTORY — PX: ESOPHAGOGASTRODUODENOSCOPY (EGD) WITH PROPOFOL: SHX5813

## 2020-11-10 LAB — COMPREHENSIVE METABOLIC PANEL
ALT: 24 U/L (ref 0–44)
AST: 56 U/L — ABNORMAL HIGH (ref 15–41)
Albumin: 2.6 g/dL — ABNORMAL LOW (ref 3.5–5.0)
Alkaline Phosphatase: 61 U/L (ref 38–126)
Anion gap: 9 (ref 5–15)
BUN: 69 mg/dL — ABNORMAL HIGH (ref 8–23)
CO2: 21 mmol/L — ABNORMAL LOW (ref 22–32)
Calcium: 7.8 mg/dL — ABNORMAL LOW (ref 8.9–10.3)
Chloride: 125 mmol/L — ABNORMAL HIGH (ref 98–111)
Creatinine, Ser: 2.53 mg/dL — ABNORMAL HIGH (ref 0.61–1.24)
GFR, Estimated: 24 mL/min — ABNORMAL LOW (ref >60)
Glucose, Bld: 117 mg/dL — ABNORMAL HIGH (ref 70–99)
Potassium: 2.8 mmol/L — ABNORMAL LOW (ref 3.5–5.1)
Sodium: 155 mmol/L — ABNORMAL HIGH (ref 135–145)
Total Bilirubin: 2.2 mg/dL — ABNORMAL HIGH (ref 0.3–1.2)
Total Protein: 5.9 g/dL — ABNORMAL LOW (ref 6.5–8.1)

## 2020-11-10 LAB — CBC
HCT: 28.2 % — ABNORMAL LOW (ref 39.0–52.0)
Hemoglobin: 8.9 g/dL — ABNORMAL LOW (ref 13.0–17.0)
MCH: 31.8 pg (ref 26.0–34.0)
MCHC: 31.6 g/dL (ref 30.0–36.0)
MCV: 100.7 fL — ABNORMAL HIGH (ref 80.0–100.0)
Platelets: 132 10*3/uL — ABNORMAL LOW (ref 150–400)
RBC: 2.8 MIL/uL — ABNORMAL LOW (ref 4.22–5.81)
RDW: 21.3 % — ABNORMAL HIGH (ref 11.5–15.5)
WBC: 7 10*3/uL (ref 4.0–10.5)
nRBC: 0 % (ref 0.0–0.2)

## 2020-11-10 LAB — URINE CULTURE

## 2020-11-10 LAB — GLUCOSE, CAPILLARY: Glucose-Capillary: 99 mg/dL (ref 70–99)

## 2020-11-10 SURGERY — ESOPHAGOGASTRODUODENOSCOPY (EGD) WITH PROPOFOL
Anesthesia: General

## 2020-11-10 MED ORDER — SODIUM CHLORIDE 0.9 % IV SOLN: INTRAVENOUS | Status: DC

## 2020-11-10 MED ORDER — POTASSIUM CL IN DEXTROSE 5% 20 MEQ/L IV SOLN
20.0000 meq | INTRAVENOUS | Status: DC
  Administered 2020-11-10 – 2020-11-11 (×3): 20 meq via INTRAVENOUS

## 2020-11-10 MED ORDER — SUCRALFATE 1 GM/10ML PO SUSP
1.0000 g | Freq: Three times a day (TID) | ORAL | Status: DC
  Administered 2020-11-10 – 2020-11-14 (×15): 1 g via ORAL
  Filled 2020-11-10 (×19): qty 10

## 2020-11-10 MED ORDER — LACTATED RINGERS IV SOLN: INTRAVENOUS | Status: DC | PRN

## 2020-11-10 MED ORDER — POTASSIUM CHLORIDE 10 MEQ/100ML IV SOLN
10.0000 meq | INTRAVENOUS | Status: AC
  Administered 2020-11-10 (×4): 10 meq via INTRAVENOUS
  Filled 2020-11-10 (×3): qty 100

## 2020-11-10 MED ORDER — LACTATED RINGERS IV SOLN: INTRAVENOUS | Status: DC

## 2020-11-10 MED ORDER — LIDOCAINE HCL (CARDIAC) PF 100 MG/5ML IV SOSY
PREFILLED_SYRINGE | INTRAVENOUS | Status: DC | PRN
  Administered 2020-11-10: 50 mg via INTRAVENOUS

## 2020-11-10 MED ORDER — ETOMIDATE 2 MG/ML IV SOLN
INTRAVENOUS | Status: AC
  Filled 2020-11-10: qty 10

## 2020-11-10 MED ORDER — PHENYLEPHRINE 40 MCG/ML (10ML) SYRINGE FOR IV PUSH (FOR BLOOD PRESSURE SUPPORT)
PREFILLED_SYRINGE | INTRAVENOUS | Status: DC | PRN
  Administered 2020-11-10: 40 ug via INTRAVENOUS

## 2020-11-10 MED ORDER — PHENYLEPHRINE 40 MCG/ML (10ML) SYRINGE FOR IV PUSH (FOR BLOOD PRESSURE SUPPORT)
PREFILLED_SYRINGE | INTRAVENOUS | Status: AC
  Filled 2020-11-10: qty 10

## 2020-11-10 MED ORDER — PROPOFOL 10 MG/ML IV BOLUS
INTRAVENOUS | Status: DC | PRN
  Administered 2020-11-10: 50 mg via INTRAVENOUS

## 2020-11-10 MED ORDER — EPHEDRINE 5 MG/ML INJ
INTRAVENOUS | Status: AC
  Filled 2020-11-10: qty 10

## 2020-11-10 MED ORDER — EPHEDRINE SULFATE-NACL 50-0.9 MG/10ML-% IV SOSY
PREFILLED_SYRINGE | INTRAVENOUS | Status: DC | PRN
  Administered 2020-11-10: 10 mg via INTRAVENOUS

## 2020-11-10 NOTE — Progress Notes (Signed)
Pharmacy Antibiotic Note  Ryan Macdonald is a 86 y.o. male admitted on 11/07/2020 with unknown source.  Pharmacy has been consulted for  cefepime dosing. Afebrile, Possible colitis noted on CT abdomen. Possible EGD today  Plan: Continue Cefepime 2gm IV q24h Flagyl 500mg  IV q8h F/U cxs and clinical progress Monitor V/S, labs  Height: 5\' 7"  (170.2 cm) Weight: 72.5 kg (159 lb 13.3 oz) IBW/kg (Calculated) : 66.1  Temp (24hrs), Avg:97.3 F (36.3 C), Min:96.7 F (35.9 C), Max:98 F (36.7 C)  Recent Labs  Lab 11/07/20 1527 11/07/20 1528 11/07/20 1735 11/08/20 0950 11/09/20 0421 11/10/20 0841  WBC  --  10.6*  --  8.0 9.7 7.0  CREATININE  --  3.76*  --  3.24* 3.10*  --   LATICACIDVEN 3.9*  --  2.6*  --   --   --     Estimated Creatinine Clearance: 16.6 mL/min (A) (by C-G formula based on SCr of 3.1 mg/dL (H)).    Allergies  Allergen Reactions  . Lisinopril Swelling    Facial swelling  . Xarelto [Rivaroxaban] Other (See Comments)    "Blood came out of my penis"    Antimicrobials this admission: Vancomycin 12/10 >> 12/11 Cefepime 12/10 >> Flagyl  12/10>>   Dose adjustments this admission: prn  Microbiology results: 12/10 BCx: ngtd 12/10 and 12/11 UCx: multiple species, NTR  12/11MRSA PCR: positive  Thank you for allowing pharmacy to be a part of this patient's care.  14/10, BS Pharm D, 14/11 Clinical Pharmacist Pager [phone] 11/10/2020 9:02 AM

## 2020-11-10 NOTE — TOC Initial Note (Signed)
Transition of Care [facility]) - Initial/Assessment Note    Patient Details  Name: Ryan Macdonald MRN: [id number] Date of Birth: 10-May-1934  [facility]) CM/SW Contact:    Karn Cassis, LCSW Phone Number: 11/10/2020, 2:44 PM  Clinical Narrative:  Pt admitted due to sepsis. He has been a resident at [facility] for about a month. LCSW attempted to reach pt's wife on home and cell numbers as pt is currently having EGD. Voicemail left on both phones. Per Revonda Standard at facility, pt is okay to return. He is not VA service connected, so is at [facility] under Medicare. Palliative consult pending. Anticipate return to SNF, but will confirm when able to reach pt/family.                  Expected Discharge Plan: Skilled Nursing Facility Barriers to Discharge: Continued Medical Work up   Patient Goals and CMS Choice Patient states their goals for this hospitalization and ongoing recovery are:: anticipate return to SNF      Expected Discharge Plan and Services Expected Discharge Plan: Skilled Nursing Facility In-house Referral: Clinical Social Work     Living arrangements for the past 2 months: Skilled Nursing Facility                                      Prior Living Arrangements/Services Living arrangements for the past 2 months: Skilled Nursing Facility Lives with:: Facility Resident Patient language and need for interpreter reviewed:: Yes        Need for Family Participation in Patient Care: No (Comment)     Criminal Activity/Legal Involvement Pertinent to Current Situation/Hospitalization: No - Comment as needed  Activities of Daily Living Home Assistive Devices/Equipment: None ADL Screening (condition at time of admission) Patient's cognitive ability adequate to safely complete daily activities?: No Is the patient deaf or have difficulty hearing?: No Does the patient have difficulty seeing, even when wearing glasses/contacts?: No Does the  patient have difficulty concentrating, remembering, or making decisions?: Yes Patient able to express need for assistance with ADLs?: Yes Does the patient have difficulty dressing or bathing?: Yes Independently performs ADLs?: No Communication: Appropriate for developmental age Dressing (OT): Needs assistance Is this a change from baseline?: Pre-admission baseline Grooming: Needs assistance Is this a change from baseline?: Pre-admission baseline Feeding: Needs assistance Is this a change from baseline?: Pre-admission baseline Bathing: Needs assistance Is this a change from baseline?: Pre-admission baseline Toileting: Needs assistance Is this a change from baseline?: Pre-admission baseline In/Out Bed: Dependent Is this a change from baseline?: Pre-admission baseline Walks in Home: Dependent Is this a change from baseline?: Pre-admission baseline Does the patient have difficulty walking or climbing stairs?: Yes Weakness of Legs: Both Weakness of Arms/Hands: Both  Permission Sought/Granted                  Emotional Assessment         Alcohol / Substance Use: Not Applicable Psych Involvement: No (comment)  Admission diagnosis:  Melena [K92.1] Acute GI bleeding [K92.2] Acute kidney injury (HCC) [N17.9] Sepsis (HCC) [A41.9] Severe sepsis (HCC) [A41.9, R65.20] Altered mental status, unspecified altered mental status type [R41.82] AMS (altered mental status) [R41.82] Patient Active Problem List   Diagnosis Date Noted  . Pressure injury of skin 11/09/2020  . Diabetes mellitus type 2, controlled, with complications (HCC) 10/09/2020  . Abdominal pain 09/26/2020  . Pancreatitis 09/26/2020  .  Open wound of left heel 09/26/2020  . Protein-calorie malnutrition, severe (HCC) 09/23/2020  . Hematuria 02/15/2020  . Constipation 02/13/2020  . Anemia of chronic disease 02/13/2020  . Secondary hypercoagulable state (HCC) 01/16/2020  . Acute renal failure superimposed on stage 3  chronic kidney disease (HCC) 03/09/2019  . Transaminitis   . Chronic respiratory failure with hypoxia (HCC)   . Morbid obesity (HCC) 09/09/2018  . UTI (urinary tract infection) 08/31/2018  . First degree AV block   . Left knee pain 07/08/2017  . Elevated troponin 04/14/2017  . Lactic acidosis   . Diabetes mellitus type 2 in obese (HCC) 02/03/2017  . Lobar pneumonia (HCC)   . Diabetes mellitus with complication (HCC)   . Depression 05/19/2016  . Gout 05/19/2016  . 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 NSVT (nonsustained ventricular tachycardia) (HCC)   . Chronic renal insufficiency, stage 3 (moderate) (HCC)   . Hyperkalemia 08/21/2015  . Pulmonary embolism (HCC)   . Osteoarthritis of left hip 10/25/2013  . PAF (paroxysmal atrial fibrillation) (HCC) 10/25/2013  . S/P hip replacement 10/24/2013  . Nonischemic cardiomyopathy (HCC)   . Obesity (BMI 30-39.9)   . Essential hypertension   . Hyperlipidemia    PCP:  Sherol Dade, DO Pharmacy:   [facility] - [place], [place] - [address] [place] [address] Phone: [phone] Fax: [phone]  [id number] - [place], [place] - [address] AT [facility] [address] [place] [address] Phone: [phone] Fax: [phone]  [id number] - [place], [place] - [address] AT [facility] [address] [place] [address] Phone: [phone] Fax: [phone]     Social Determinants of Health (SDOH) Interventions    Readmission Risk Interventions Readmission Risk Prevention Plan 11/10/2020 09/25/2020  Transportation Screening Complete Complete  HRI or Home Care Consult - Complete  Social Work Consult for Recovery Care Planning/Counseling - Complete  Palliative Care Screening - Not Applicable  Medication Review Oceanographer) Complete Complete  HRI or Home  Care Consult Complete -  SW Recovery Care/Counseling Consult Complete -  Palliative Care Screening Complete -  Skilled Nursing Facility Complete -  Some recent data might be hidden

## 2020-11-10 NOTE — Anesthesia Postprocedure Evaluation (Signed)
Anesthesia Post Note  Patient: Ryan Macdonald  Procedure(s) Performed: ESOPHAGOGASTRODUODENOSCOPY (EGD) WITH PROPOFOL (N/A )  Patient location during evaluation: PACU Anesthesia Type: General Level of consciousness: obtunded/minimal responses Pain management: pain level controlled Vital Signs Assessment: post-procedure vital signs reviewed and stable Respiratory status: spontaneous breathing Cardiovascular status: blood pressure returned to baseline Postop Assessment: no headache Anesthetic complications: no Comments: Back to baseline cognition.   No complications documented.   Last Vitals:  Vitals:   11/10/20 1520 11/10/20 1530  BP: (!) 112/56 (!) 93/52  Pulse: 81 70  Resp: 19 19  Temp: (!) 36.2 C   SpO2: 99% 100%    Last Pain:  Vitals:   11/10/20 1430  TempSrc:   PainSc: 0-No pain                 Windell Norfolk

## 2020-11-10 NOTE — Anesthesia Preprocedure Evaluation (Signed)
Anesthesia Evaluation  Patient identified by MRN, date of birth, ID band Patient awake    Reviewed: Allergy & Precautions, H&P , NPO status , Patient's Chart, lab work & pertinent test results, reviewed documented beta blocker date and time   Airway Mallampati: II  TM Distance: >3 FB Neck ROM: full    Dental no notable dental hx. (+) Edentulous Upper, Edentulous Lower   Pulmonary shortness of breath, pneumonia, former smoker,    Pulmonary exam normal breath sounds clear to auscultation       Cardiovascular Exercise Tolerance: Good hypertension, +CHF   Rhythm:regular Rate:Normal     Neuro/Psych PSYCHIATRIC DISORDERS Depression negative neurological ROS     GI/Hepatic negative GI ROS, Neg liver ROS,   Endo/Other  negative endocrine ROSdiabetes  Renal/GU CRFRenal disease  negative genitourinary   Musculoskeletal   Abdominal   Peds  Hematology  (+) Blood dyscrasia, anemia ,   Anesthesia Other Findings   Reproductive/Obstetrics negative OB ROS                             Anesthesia Physical Anesthesia Plan  ASA: IV and emergent  Anesthesia Plan: General   Post-op Pain Management:    Induction:   PONV Risk Score and Plan: Propofol infusion  Airway Management Planned:   Additional Equipment:   Intra-op Plan:   Post-operative Plan:   Informed Consent: I have reviewed the patients History and Physical, chart, labs and discussed the procedure including the risks, benefits and alternatives for the proposed anesthesia with the patient or authorized representative who has indicated his/her understanding and acceptance.     Dental Advisory Given  Plan Discussed with: CRNA  Anesthesia Plan Comments:         Anesthesia Quick Evaluation

## 2020-11-10 NOTE — Op Note (Signed)
[facility] Patient Name: Ryan Macdonald Procedure Date: 11/10/2020 2:21 PM MRN: [id number] Date of Birth: 1934/06/28 Attending MD: Elon Alas. Abbey Chatters DO CSN: [id number] Age: 86 Admit Type: Outpatient Procedure:                Upper GI endoscopy Indications:              Melena Providers:                Elon Alas. Abbey Chatters, DO, Lurline Del, RN, Aram Candela Referring MD:              Medicines:                See the Anesthesia note for documentation of the                            administered medications Complications:            No immediate complications. Estimated Blood Loss:     Estimated blood loss: none. Procedure:                Pre-Anesthesia Assessment:                           - The anesthesia plan was to use monitored                            anesthesia care (MAC).                           After obtaining informed consent, the endoscope was                            passed under direct vision. Throughout the                            procedure, the patient's blood pressure, pulse, and                            oxygen saturations were monitored continuously. The                            [id number]-[id number] ([id number]) scope was introduced through the                            mouth, and advanced to the second part of duodenum.                            The upper GI endoscopy was accomplished without                            difficulty. The patient tolerated the procedure                            fairly well. Scope In: 3:09:18 PM Scope Out: 3:12:52 PM Total Procedure Duration: 0 hours 3 minutes 34 seconds  Findings:      LA Grade C (one or more mucosal breaks continuous between  tops of 2 or       more mucosal folds, less than 75% circumference) esophagitis with no       bleeding was found in the lower third of the esophagus. Entire esophagus       was strictured down with numerous esophageal rings. Small blood clot       noted in the esophagus, pushed into  stomach. Etiology unclear. No active       bleeding or stigmata of bleeding noted in the esophagus.      Localized severe inflammation characterized by congestion (edema),       erosions and erythema was found in the gastric antrum. No active       bleeding but certainly could have been oozing previously.      The duodenal bulb, first portion of the duodenum and second portion of       the duodenum were normal. Impression:               - LA Grade C erosive esophagitis with no bleeding.                           - Gastritis.                           - Normal duodenal bulb, first portion of the                            duodenum and second portion of the duodenum.                           - No specimens collected. Moderate Sedation:      Per Anesthesia Care Recommendation:           - Return patient to hospital ward for ongoing care.                           - Full liquid diet.                           - Continue on IV Protonix 40 mg BID. Add liquid                            Carafate QID.                           - Continue to monitor Hgb and transfuse as                            clinically warranted. Procedure Code(s):        --- Professional ---                           [phone], Esophagogastroduodenoscopy, flexible,                            transoral; diagnostic, including collection of                            specimen(s) by brushing or washing,  when performed                            (separate procedure) Diagnosis Code(s):        --- Professional ---                           K20.80, Other esophagitis without bleeding                           K29.70, Gastritis, unspecified, without bleeding                           K92.1, Melena (includes Hematochezia) CPT copyright 2019 [employer]. All rights reserved. The codes documented in this report are preliminary and upon coder review may  be revised to meet current compliance requirements. Elon Alas.  Abbey Chatters, DO [place] Abbey Chatters, DO 11/10/2020 3:29:32 PM This report has been signed electronically. Number of Addenda: 0

## 2020-11-10 NOTE — Progress Notes (Signed)
PROGRESS NOTE    Ryan Macdonald  BWL:[id number] DOB: 10/10/1934 DOA: 11/07/2020 PCP: Sherol Dade, DO    Brief Narrative:  86 year old male with multiple medical problems including chronic systolic heart failure, EF of 20 to 25%, paroxysmal atrial fibrillation, chronic kidney disease stage IIIb, is brought to the emergency room with increasing lethargy and decreased p.o. intake.  He was noted to be severely anemic with a hemoglobin of 6.8, had acute kidney injury with a creatinine of 3.7 and a sodium of 157.  Patient was transfused PRBC and GI consulted.  Urinalysis indicates possible infection he is on antibiotics he is being rehydrated with IV fluids   Assessment & Plan:   Active Problems:   Nonischemic cardiomyopathy (HCC)   Essential hypertension   Hyperlipidemia   PAF (paroxysmal atrial fibrillation) (HCC)   ICD (implantable cardioverter-defibrillator) in place   Diabetes mellitus type 2 in obese (HCC)   Lactic acidosis   Chronic respiratory failure with hypoxia (HCC)   Acute renal failure superimposed on stage 3 chronic kidney disease (HCC)   Protein-calorie malnutrition, severe (HCC)   Pressure injury of skin   Acute blood loss anemia -Secondary to GI bleeding -Patient noted to have melena, FOBT positive -Hemoglobin 6.8 on admission -He has received a total of 3 units of PRBCs thus far with follow-up hemoglobin being 8.5 -Continue to follow hemoglobin and transfuse as needed -Seen by GI and underwent EGD that showed erosive esophagitis without bleeding -Recommendations were to continue on PPI twice daily as well as Carafate  Acute kidney injury on chronic kidney disease stage IIIb -Likely related to volume depletion and dehydration -No hydronephrosis reported on imaging -Continue IV fluids, monitor urine output  Hypotension -Suspect this is related to hypovolemia secondary to severe dehydration as well as GI bleeding -Continue to hold  antihypertensives -Continue volume resuscitation -He also received albumin infusions since he is severely hypoalbuminemic and would likely third space IV fluids  Lactic acidosis -Related to dehydration and hypovolemia -Continue IV fluid resuscitation  Paroxysmal atrial fibrillation -Chronically on amiodarone, restart once mental status will allow p.o. intake -Chronically on Eliquis, currently on hold due to concerns for GI bleeding  Hypernatremia -Related to severe dehydration and free water deficit -Continue hypotonic fluids  Urinary tract infection -Urinalysis indicates possible infection -Based on sepsis criteria on admission, sepsis has been ruled out -He is on intravenous antibiotics -Urine culture shows multiple species  Possible colitis -Noted on CT abdomen -Continue on cefepime and Flagyl  Acute metabolic encephalopathy -Suspect this is related to severe dehydration, hypernatremia -He is still lethargic and does not engage in conversation.  Does not appear to be back to baseline  Chronic systolic congestive heart failure/nonischemic cardiomyopathy -Ejection fraction of 20 to 25%, AICD in place -Holding diuretics in the setting of hypotension/renal failure -Continue to monitor volume status  Severe protein calorie malnutrition -P.o. intake has been poor -Nutrition consult  Hypothyroidism -Resume Synthroid if able to tolerate p.o. intake  Hypokalemia -Replace  Goals of care -Discussed advanced directives with patient's wife Ahmad Magann -She has discussed CODE STATUS with patient's children and all are in agreement for DNR status -Will need to have further conversations regarding PEG tube placement and whether that is the right choice for the patient/how it may impact his quality of life -Palliative care consulted to help address goals of care    DVT prophylaxis: SCDs Start: 11/07/20 2053  Code Status: full code Family Communication: Discussed with  patient wife on 12/12 Disposition Plan:  Status is: Inpatient  Remains inpatient appropriate because:Inpatient level of care appropriate due to severity of illness   Dispo: The patient is from: SNF              Anticipated d/c is to: SNF              Anticipated d/c date is: > 3 days              Patient currently is not medically stable to d/c.    Consultants:   Gastroenterology  Procedures:   EGD  Antimicrobials:   Cefepime 12/10>  Flagyl 12/10>  Vancomycin 12/10>12/11    Subjective: Patient opens eyes to voice, answers yes and no.  Denies any pain or shortness of breath.  Objective: Vitals:   11/10/20 1631 11/10/20 1700 11/10/20 1730 11/10/20 1800  BP:  (!) 102/41 (!) 93/32 (!) 105/43  Pulse: 60 (!) 59 (!) 58 61  Resp: 18 18 16 17   Temp: 98.2 F (36.8 C)     TempSrc: Oral     SpO2: 100% 100% 97% 97%  Weight:      Height:        Intake/Output Summary (Last 24 hours) at 11/10/2020 1954 Last data filed at 11/10/2020 1658 Gross per 24 hour  Intake 1744.14 ml  Output 700 ml  Net 1044.14 ml   Filed Weights   11/07/20 1319 11/09/20 0437 11/10/20 0441  Weight: 85.3 kg 73.2 kg 72.5 kg    Examination:  General exam: Wakes up to voice, no distress Respiratory system: Clear to auscultation. Respiratory effort normal. Cardiovascular system:RRR. No murmurs, rubs, gallops. Gastrointestinal system: Abdomen is nondistended, soft and nontender. No organomegaly or masses felt. Normal bowel sounds heard. Central nervous system: No focal neurological deficits. Extremities: No C/C/E, +pedal pulses Skin: No rashes, lesions or ulcers Psychiatry: Unable to assess due to mental status    Data Reviewed: I have personally reviewed following labs and imaging studies  CBC: Recent Labs  Lab 11/07/20 1528 11/08/20 0950 11/09/20 0421 11/10/20 0841  WBC 10.6* 8.0 9.7 7.0  NEUTROABS 8.6*  --   --   --   HGB 6.8* 7.5* 8.5* 8.9*  HCT 22.2* 23.7* 28.3* 28.2*  MCV  111.0* 106.3* 104.4* 100.7*  PLT 266 157 148* 132*   Basic Metabolic Panel: Recent Labs  Lab 11/07/20 1528 11/08/20 0950 11/09/20 0421 11/10/20 0841  NA 157* 155* 154* 155*  K 4.4 4.5 3.5 2.8*  CL 124* 127* 126* 125*  CO2 20* 18* 16* 21*  GLUCOSE 134* 123* 129* 117*  BUN 92* 86* 83* 69*  CREATININE 3.76* 3.24* 3.10* 2.53*  CALCIUM 9.2 8.1* 8.3* 7.8*   GFR: Estimated Creatinine Clearance: 20.3 mL/min (A) (by C-G formula based on SCr of 2.53 mg/dL (H)). Liver Function Tests: Recent Labs  Lab 11/07/20 1528 11/08/20 0950 11/09/20 0421 11/10/20 0841  AST 74* 59* 53* 56*  ALT 32 27 22 24   ALKPHOS 88 73 60 61  BILITOT 2.3* 1.7* 1.8* 2.2*  PROT 7.3 6.4* 6.5 5.9*  ALBUMIN 2.2* 2.0* 2.9* 2.6*   No results for input(s): LIPASE, AMYLASE in the last 168 hours. No results for input(s): AMMONIA in the last 168 hours. Coagulation Profile: Recent Labs  Lab 11/08/20 0950  INR 1.9*   Cardiac Enzymes: No results for input(s): CKTOTAL, CKMB, CKMBINDEX, TROPONINI in the last 168 hours. BNP (last 3 results) No results for input(s): PROBNP in the last 8760 hours. HbA1C: No results for input(s): HGBA1C in the  last 72 hours. CBG: Recent Labs  Lab 11/07/20 1418 11/09/20 0735 11/10/20 1535  GLUCAP 118* 113* 99   Lipid Profile: No results for input(s): CHOL, HDL, LDLCALC, TRIG, CHOLHDL, LDLDIRECT in the last 72 hours. Thyroid Function Tests: No results for input(s): TSH, T4TOTAL, FREET4, T3FREE, THYROIDAB in the last 72 hours. Anemia Panel: No results for input(s): VITAMINB12, FOLATE, FERRITIN, TIBC, IRON, RETICCTPCT in the last 72 hours. Sepsis Labs: Recent Labs  Lab 11/07/20 1527 11/07/20 1735 11/08/20 0950  PROCALCITON  --   --  0.29  LATICACIDVEN 3.9* 2.6*  --     Recent Results (from the past 240 hour(s))  Resp Panel by RT-PCR (Flu A&B, Covid) Nasopharyngeal Swab     Status: None   Collection Time: 11/07/20  2:44 PM   Specimen: Nasopharyngeal Swab;  Nasopharyngeal(NP) swabs in vial transport medium  Result Value Ref Range Status   SARS Coronavirus 2 by RT PCR NEGATIVE NEGATIVE Final    Comment: (NOTE) SARS-CoV-2 target nucleic acids are NOT DETECTED.  The SARS-CoV-2 RNA is generally detectable in upper respiratory specimens during the acute phase of infection. The lowest concentration of SARS-CoV-2 viral copies this assay can detect is 138 copies/mL. A negative result does not preclude SARS-Cov-2 infection and should not be used as the sole basis for treatment or other patient management decisions. A negative result may occur with  improper specimen collection/handling, submission of specimen other than nasopharyngeal swab, presence of viral mutation(s) within the areas targeted by this assay, and inadequate number of viral copies(<138 copies/mL). A negative result must be combined with clinical observations, patient history, and epidemiological information. The expected result is Negative.  Fact Sheet for Patients:  BloggerCourse.com  Fact Sheet for Healthcare Providers:  SeriousBroker.it  This test is no t yet approved or cleared by the [place] FDA and  has been authorized for detection and/or diagnosis of SARS-CoV-2 by FDA under an Emergency Use Authorization (EUA). This EUA will remain  in effect (meaning this test can be used) for the duration of the COVID-19 declaration under Section 564(b)(1) of the Act, 21 U.S.C.section 360bbb-3(b)(1), unless the authorization is terminated  or revoked sooner.       Influenza A by PCR NEGATIVE NEGATIVE Final   Influenza B by PCR NEGATIVE NEGATIVE Final    Comment: (NOTE) The Xpert Xpress SARS-CoV-2/FLU/RSV plus assay is intended as an aid in the diagnosis of influenza from Nasopharyngeal swab specimens and should not be used as a sole basis for treatment. Nasal washings and aspirates are unacceptable for Xpert Xpress  SARS-CoV-2/FLU/RSV testing.  Fact Sheet for Patients: BloggerCourse.com  Fact Sheet for Healthcare Providers: SeriousBroker.it  This test is not yet approved or cleared by the [place] FDA and has been authorized for detection and/or diagnosis of SARS-CoV-2 by FDA under an Emergency Use Authorization (EUA). This EUA will remain in effect (meaning this test can be used) for the duration of the COVID-19 declaration under Section 564(b)(1) of the Act, 21 U.S.C. section 360bbb-3(b)(1), unless the authorization is terminated or revoked.  Performed at [facility], [address] [place], [place] [address]   Culture, blood (Routine X 2) w Reflex to ID Panel     Status: None (Preliminary result)   Collection Time: 11/07/20  3:28 PM   Specimen: BLOOD RIGHT FOREARM  Result Value Ref Range Status   Specimen Description   Final    BLOOD RIGHT FOREARM BOTTLES DRAWN AEROBIC AND ANAEROBIC   Special Requests Blood Culture adequate volume  Final   Culture   Final    NO GROWTH 3 DAYS Performed at [facility], [address], [place], [place] [address]    Report Status PENDING  Incomplete  Culture, blood (Routine X 2) w Reflex to ID Panel     Status: None (Preliminary result)   Collection Time: 11/07/20  3:59 PM   Specimen: Left Antecubital; Blood  Result Value Ref Range Status   Specimen Description LEFT ANTECUBITAL  Final   Special Requests   Final    BOTTLES DRAWN AEROBIC AND ANAEROBIC Blood Culture adequate volume   Culture   Final    NO GROWTH 3 DAYS Performed at [facility], [address], [place], [place] [address]    Report Status PENDING  Incomplete  Urine Culture     Status: Abnormal   Collection Time: 11/07/20  5:00 PM   Specimen: Urine, Clean Catch  Result Value Ref Range Status   Specimen Description   Final    URINE, CLEAN CATCH Performed at [facility], [address] [place], [place] [address]    Special Requests    Final    NONE Performed at [facility], [address] [place], [place] [address]    Culture MULTIPLE SPECIES PRESENT, SUGGEST RECOLLECTION (A)  Final   Report Status 11/09/2020 FINAL  Final  Culture, Urine     Status: Abnormal   Collection Time: 11/08/20  3:51 PM   Specimen: Urine, Clean Catch  Result Value Ref Range Status   Specimen Description   Final    URINE, CLEAN CATCH Performed at [facility], [address], [place], [place] [address]    Special Requests   Final    NONE Performed at [facility], [address] [place], [place] [address]    Culture MULTIPLE SPECIES PRESENT, SUGGEST RECOLLECTION (A)  Final   Report Status 11/10/2020 FINAL  Final  MRSA PCR Screening     Status: Abnormal   Collection Time: 11/08/20  4:00 PM   Specimen: Nasopharyngeal  Result Value Ref Range Status   MRSA by PCR POSITIVE (A) NEGATIVE Final    Comment:        The GeneXpert MRSA Assay (FDA approved for NASAL specimens only), is one component of a comprehensive MRSA colonization surveillance program. It is not intended to diagnose MRSA infection nor to guide or monitor treatment for MRSA infections. RESULT CALLED TO, READ BACK BY AND VERIFIED WITH: K LOOMIS AT 1836 ON 11/08/2020 BY MOSLEY,J Performed at [facility], [address], [place], [place] [address]          Radiology Studies: No results found.      Scheduled Meds: . sodium chloride   Intravenous Once  . sodium chloride   Intravenous Once  . Chlorhexidine Gluconate Cloth  6 each Topical Q0600  . mupirocin ointment  1 application Nasal BID  . [START ON 11/11/2020] pantoprazole  40 mg Intravenous Q12H   Continuous Infusions: . sodium chloride    . ceFEPime (MAXIPIME) IV Stopped (11/09/20 1602)  . dextrose 5 % with KCl 20 mEq / L 20 mEq (11/10/20 1719)  . metronidazole 500 mg (11/10/20 1721)     LOS: 3 days    Time spent:    Erick Blinks, MD [facility]   If 7PM-7AM, please  contact night-coverage [email or web address]  11/10/2020, 7:54 PM

## 2020-11-10 NOTE — Progress Notes (Signed)
Palliative-   Left message for patient's spouse and daughter req return call to scheduled GOC meeting.   Ocie Bob, AGNP-C Palliative Medicine  Please call Palliative Medicine team phone with any questions [phone]. For individual providers please see AMION.  No charge

## 2020-11-10 NOTE — Interval H&P Note (Signed)
History and Physical Interval Note:  11/10/2020 2:55 PM  Ryan Macdonald  has presented today for surgery, with the diagnosis of acute blood loss anemia, melena.  The various methods of treatment have been discussed with the patient and family. After consideration of risks, benefits and other options for treatment, the patient has consented to  Procedure(s): ESOPHAGOGASTRODUODENOSCOPY (EGD) WITH PROPOFOL (N/A) as a surgical intervention.  The patient's history has been reviewed, patient examined, no change in status, stable for surgery.  I have reviewed the patient's chart and labs.  Questions were answered to the patient's satisfaction.     Lanelle Bal

## 2020-11-10 NOTE — Progress Notes (Signed)
Pre-op staff at bedside to transport patient to procedure.

## 2020-11-10 NOTE — Progress Notes (Addendum)
    Subjective: Denies abdominal pain. No N/V per nursing staff. One melanotic stool overnight. Tapering off per nursing staff. Limited verbal.   Objective: Vital signs in last 24 hours: Temp:  [96.7 F (35.9 C)-98 F (36.7 C)] 97.6 F (36.4 C) (12/13 0720) Pulse Rate:  [55-64] 57 (12/13 0837) Resp:  [14-21] 17 (12/13 0837) BP: (81-104)/(33-47) 104/42 (12/13 0837) SpO2:  [96 %-99 %] 98 % (12/13 0837) Weight:  [72.5 kg] 72.5 kg (12/13 0441) Last BM Date: 11/09/20 General:   Alert and oriented to person. Difficult to engage in conversation. No distress. Chronically ill-appearing Eyes:  No icterus, sclera clear. Conjuctiva pink.  Mouth:  Without lesions, mucosa pink and moist.  Abdomen:  Bowel sounds present, full but soft, no tenderness to palpation, no HSM Neurologic: unable to assess orientation Psych:  Flat affect  Intake/Output from previous day: 12/12 0701 - 12/13 0700 In: 1347.6 [I.V.:852.2; IV Piggyback:495.4] Out: 750 [Urine:750] Intake/Output this shift: No intake/output data recorded.  Lab Results: Recent Labs    11/07/20 1528 11/08/20 0950 11/09/20 0421  WBC 10.6* 8.0 9.7  HGB 6.8* 7.5* 8.5*  HCT 22.2* 23.7* 28.3*  PLT 266 157 148*   BMET Recent Labs    11/07/20 1528 11/08/20 0950 11/09/20 0421  NA 157* 155* 154*  K 4.4 4.5 3.5  CL 124* 127* 126*  CO2 20* 18* 16*  GLUCOSE 134* 123* 129*  BUN 92* 86* 83*  CREATININE 3.76* 3.24* 3.10*  CALCIUM 9.2 8.1* 8.3*   LFT Recent Labs    11/07/20 1528 11/08/20 0950 11/09/20 0421  PROT 7.3 6.4* 6.5  ALBUMIN 2.2* 2.0* 2.9*  AST 74* 59* 53*  ALT 32 27 22  ALKPHOS 88 73 60  BILITOT 2.3* 1.7* 1.8*   PT/INR Recent Labs    11/08/20 0950  LABPROT 21.5*  INR 1.9*    Assessment: 86 year old male with numerous comorbidities including afib on Eliquis, CKD, chronic heart failure with AICD and EF 20-25% (Sept 2021), admitted with sepsis in setting of UTI, found to also have melena on presentation and  acute blood loss anemia.   Acute blood loss anemia: review of epic with Hgb past few months 8/9 range, presenting with Hgb 6.8 this admission in setting of Eliquis. Received 3 units PRBCs this admission, with Hgb holding steady this morning and improved from yesterday to 8.9.  One episode of melena overnight, which seems to be slowing down per nursing staff. Eliquis has been on hold since presentation 12/10.   Colitis on CT: involving mid to distal sigmoid colon. No diarrhea. Continue with antibiotics. Recommend direct colon visualization as outpatient once recovers from acute illness.   Plan: NPO Continue to hold Eliquis EGD with Dr. Marletta Lor planned for today. Discussed with his wife, as patient is unable to give verbal consent. Risks and benefits discussed with stated understanding.  Continue Protonix infusion  Further recommendations to follow   Gelene Mink, PhD, ANP-BC [facility]        LOS: 3 days    11/10/2020, 8:44 AM

## 2020-11-10 NOTE — NC FL2 (Signed)
[place] MEDICAID FL2 LEVEL OF CARE SCREENING TOOL     IDENTIFICATION  Patient Name: Ryan Macdonald Birthdate: 1933-12-25 Sex: male Admission Date (Current Location): 11/07/2020  [facility] and [place] Number:  [employer] and Address:  [facility],  [address], [place] [address]      Provider Number: [id number]  Attending Physician Name and Address:  Erick Blinks, MD  Relative Name and Phone Number:       Current Level of Care: Hospital Recommended Level of Care: Skilled Nursing Facility Prior Approval Number:    Date Approved/Denied:   PASRR Number:    Discharge Plan: SNF    Current Diagnoses: Patient Active Problem List   Diagnosis Date Noted  . Pressure injury of skin 11/09/2020  . Diabetes mellitus type 2, controlled, with complications (HCC) 10/09/2020  . Abdominal pain 09/26/2020  . Pancreatitis 09/26/2020  . Open wound of left heel 09/26/2020  . Protein-calorie malnutrition, severe (HCC) 09/23/2020  . Hematuria 02/15/2020  . Constipation 02/13/2020  . Anemia of chronic disease 02/13/2020  . Secondary hypercoagulable state (HCC) 01/16/2020  . Acute renal failure superimposed on stage 3 chronic kidney disease (HCC) 03/09/2019  . Transaminitis   . Chronic respiratory failure with hypoxia (HCC)   . Morbid obesity (HCC) 09/09/2018  . UTI (urinary tract infection) 08/31/2018  . First degree AV block   . Left knee pain 07/08/2017  . Elevated troponin 04/14/2017  . Lactic acidosis   . Diabetes mellitus type 2 in obese (HCC) 02/03/2017  . Lobar pneumonia (HCC)   . Diabetes mellitus with complication (HCC)   . Depression 05/19/2016  . Gout 05/19/2016  . 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 NSVT (nonsustained ventricular tachycardia) (HCC)   . Chronic renal insufficiency, stage 3 (moderate) (HCC)   . Hyperkalemia 08/21/2015  . Pulmonary embolism (HCC)   . Osteoarthritis of left hip 10/25/2013   . PAF (paroxysmal atrial fibrillation) (HCC) 10/25/2013  . S/P hip replacement 10/24/2013  . Nonischemic cardiomyopathy (HCC)   . Obesity (BMI 30-39.9)   . Essential hypertension   . Hyperlipidemia     Orientation RESPIRATION BLADDER Height & Weight     Self  Normal Indwelling catheter Weight: 159 lb 13.3 oz (72.5 kg) Height:  5\' 7"  (170.2 cm)  BEHAVIORAL SYMPTOMS/MOOD NEUROLOGICAL BOWEL NUTRITION STATUS      Incontinent Diet (NPO time specified. See d/c summary for updates.)  AMBULATORY STATUS COMMUNICATION OF NEEDS Skin   Total Care Verbally Other (Comment) (Cracking to bilateral feet. Stage 2 to left buttocks with foam dressing. Stage 2 to left sacrum with foam dressing.)                       Personal Care Assistance Level of Assistance  Bathing,Dressing,Feeding Bathing Assistance: Maximum assistance Feeding assistance: Limited assistance Dressing Assistance: Maximum assistance     Functional Limitations Info  Sight,Speech,Hearing Sight Info: Adequate Hearing Info: Adequate Speech Info: Impaired    SPECIAL CARE FACTORS FREQUENCY                       Contractures      Additional Factors Info  Code Status,Allergies Code Status Info: DNR Allergies Info: Lisinopril, Xarelto (rivaroxaban)           Current Medications (11/10/2020):  This is the current hospital active medication list Current Facility-Administered Medications  Medication Dose Route Frequency Provider Last Rate Last Admin  . 0.9 %  sodium chloride infusion (Manually program via Guardrails IV Fluids)   Intravenous Once Elgergawy, Leana Roe, MD   Stopped at 11/07/20 1803  . 0.9 %  sodium chloride infusion (Manually program via Guardrails IV Fluids)   Intravenous Once Erick Blinks, MD      . ceFEPIme (MAXIPIME) 2 g in sodium chloride 0.9 % 100 mL IVPB  2 g Intravenous Q24H Elgergawy, Leana Roe, MD   Stopped at 11/09/20 1602  . Chlorhexidine Gluconate Cloth 2 % PADS 6 each  6 each Topical  Q0600 Erick Blinks, MD   6 each at 11/10/20 0505  . dextrose 5 % with KCl 20 mEq / L  infusion  20 mEq Intravenous Continuous Erick Blinks, MD 100 mL/hr at 11/10/20 1007 20 mEq at 11/10/20 1007  . lactated ringers infusion   Intravenous Continuous Windell Norfolk, MD      . metroNIDAZOLE (FLAGYL) IVPB 500 mg  500 mg Intravenous Q8H Elgergawy, Leana Roe, MD 100 mL/hr at 11/10/20 0833 500 mg at 11/10/20 0833  . mupirocin ointment (BACTROBAN) 2 % 1 application  1 application Nasal BID Erick Blinks, MD   1 application at 11/10/20 0839  . pantoprazole (PROTONIX) 80 mg in sodium chloride 0.9 % 100 mL (0.8 mg/mL) infusion  8 mg/hr Intravenous Continuous Elgergawy, Leana Roe, MD 10 mL/hr at 11/10/20 1400 8 mg/hr at 11/10/20 1400  . [START ON 11/11/2020] pantoprazole (PROTONIX) injection 40 mg  40 mg Intravenous Q12H Elgergawy, Leana Roe, MD         Discharge Medications: Please see discharge summary for a list of discharge medications.  Relevant Imaging Results:  Relevant Lab Results:   Additional Information    Karn Cassis, LCSW

## 2020-11-10 NOTE — Progress Notes (Signed)
Initial Nutrition Assessment  DOCUMENTATION CODES:   Severe malnutrition in context of acute illness/injury  INTERVENTION:  Follow for GI findings and diet advancement  Recommendations once goals of care are established   NUTRITION DIAGNOSIS:   Severe Malnutrition related to altered GI function,poor appetite,acute illness (UTI, colitis) as evidenced by percent weight loss,moderate fat depletion,moderate muscle depletion,severe muscle depletion,energy intake < or equal to 50% for > or equal to 5 days (refusing meals, taking only sips of thickened liquids, severe dehydation at admission and weight loss).   GOAL:  Patient will meet greater than or equal to 90% of their needs (if agreeable with patient health care goals)   MONITOR:  Diet advancement,Labs,I & O's,Skin,Weight trends,Supplement acceptance,PO intake   REASON FOR ASSESSMENT:   Consult Assessment of nutrition requirement/status  ASSESSMENT: Patient is an 86 yo male from [facility]. He presents with severe dehydration, acute blood loss-anemia, possible colitis (CT), AKI acute metabolic encephalopathy. History of CHF (EF 20-25%), CKD-3, HTN, DM-2 and chronic respiratory failure with hypoxia.  Palliative medicine following to address goals of care.  Patient discussed at rounds and talked with his nurse. Currently NPO for EGD @ 1500 today. Unable to feed himself at baseline. Patient has limited ability to provide history. He answered "yea" to most questions.   Decreased oral intake reported prior to admission. Spoke with nurse at [facility]. His usual diet: pureed nectar thick liquids. Fed by staff. Poor appetite since admission 11/14 and refusing meals at times or taking bites only. Med pass nutrition supplement offered but patient regularly would often spit out. Patient is edentulous. He has upper dentures but they were not sent by facility.   Medications reviewed and include: Protonix, KCL  12/13- IV -D5@ 100  ml/hr  Labs: BMP Latest Ref Rng & Units 11/10/2020 11/09/2020 11/08/2020  Glucose 70 - 99 mg/dL 151(V) 616(W) 737(T)  BUN 8 - 23 mg/dL 06(Y) 69(S) 85(I)  Creatinine 0.61 - 1.24 mg/dL 6.27(O) 3.50(K) 9.38(H)  BUN/Creat Ratio 10 - 24 - - -  Sodium 135 - 145 mmol/L 155(H) 154(H) 155(H)  Potassium 3.5 - 5.1 mmol/L 2.8(L) 3.5 4.5  Chloride 98 - 111 mmol/L 125(H) 126(H) 127(H)  CO2 22 - 32 mmol/L 21(L) 16(L) 18(L)  Calcium 8.9 - 10.3 mg/dL 7.8(L) 8.3(L) 8.1(L)    Talked with Flow the nurse at [facility]. Patient admitted  at 188 lb (on 11/14). Weight 186 lb (84.5 kg) on 12/1. Loss of 14% in 2 weeks per facility records and hospital documentation. Current 159.5 lb/ 72.5 kg.   Acute weight change expected due to multiple factors: severe acute blood loss anemia, patient's inadequate oral intake/ severe dehydration at admission.   NUTRITION - FOCUSED PHYSICAL EXAM:  Flowsheet Row Most Recent Value  Orbital Region Moderate depletion  Upper Arm Region Moderate depletion  Thoracic and Lumbar Region Mild depletion  Buccal Region Unable to assess  Temple Region Moderate depletion  Clavicle Bone Region Severe depletion  Clavicle and Acromion Bone Region Severe depletion  Scapular Bone Region Unable to assess  Dorsal Hand Moderate depletion  Patellar Region Moderate depletion  Edema (RD Assessment) None  Hair Reviewed  Eyes Reviewed  Mouth Reviewed  Skin Reviewed  [feet very dry -bilaterally]  Nails Reviewed  [dirty, unkept]       Diet Order:   Diet Order            Diet NPO time specified Except for: Sips with Meds  Diet effective midnight  EDUCATION NEEDS: patient not appropriate Not appropriate for education at this time  Skin:  Skin Assessment: Skin Integrity Issues: Skin Integrity Issues:: Stage II Stage II: sacrum and dry cracking heels  Last BM:  12/12 rectal tube removed- Type 6 medium stool  Height:   Ht Readings from Last 1 Encounters:   11/07/20 5\' 7"  (1.702 m)    Weight:   Wt Readings from Last 1 Encounters:  11/10/20 72.5 kg    Ideal Body Weight:   67 kg  BMI:  Body mass index is 25.03 kg/m.  Estimated Nutritional Needs:   Kcal:  11/12/20  Protein:  102-110  Fluid:  per MD   [phone] MS,RD,CSG,LDN Pager: #[phone]

## 2020-11-10 NOTE — Progress Notes (Signed)
Informed consent obtained via patient's spouse Kiyoshi Schaab. Placed in front of chart.

## 2020-11-10 NOTE — Transfer of Care (Signed)
Immediate Anesthesia Transfer of Care Note  Patient: Ryan Macdonald  Procedure(s) Performed: ESOPHAGOGASTRODUODENOSCOPY (EGD) WITH PROPOFOL (N/A )  Patient Location: PACU  Anesthesia Type:General  Level of Consciousness: drowsy  Airway & Oxygen Therapy: Patient Spontanous Breathing and Patient connected to nasal cannula oxygen  Post-op Assessment: Report given to RN and Post -op Vital signs reviewed and stable  Post vital signs: Reviewed and stable  Last Vitals:  Vitals Value Taken Time  BP 112/56 11/10/20 1520  Temp    Pulse 74 11/10/20 1527  Resp 20 11/10/20 1527  SpO2 100 % 11/10/20 1527  Vitals shown include unvalidated device data.  Last Pain:  Vitals:   11/10/20 1430  TempSrc:   PainSc: 0-No pain         Complications: No complications documented.

## 2020-11-10 NOTE — H&P (View-Only) (Signed)
    Subjective: Denies abdominal pain. No N/V per nursing staff. One melanotic stool overnight. Tapering off per nursing staff. Limited verbal.   Objective: Vital signs in last 24 hours: Temp:  [96.7 F (35.9 C)-98 F (36.7 C)] 97.6 F (36.4 C) (12/13 0720) Pulse Rate:  [55-64] 57 (12/13 0837) Resp:  [14-21] 17 (12/13 0837) BP: (81-104)/(33-47) 104/42 (12/13 0837) SpO2:  [96 %-99 %] 98 % (12/13 0837) Weight:  [72.5 kg] 72.5 kg (12/13 0441) Last BM Date: 11/09/20 General:   Alert and oriented to person. Difficult to engage in conversation. No distress. Chronically ill-appearing Eyes:  No icterus, sclera clear. Conjuctiva pink.  Mouth:  Without lesions, mucosa pink and moist.  Abdomen:  Bowel sounds present, full but soft, no tenderness to palpation, no HSM Neurologic: unable to assess orientation Psych:  Flat affect  Intake/Output from previous day: 12/12 0701 - 12/13 0700 In: 1347.6 [I.V.:852.2; IV Piggyback:495.4] Out: 750 [Urine:750] Intake/Output this shift: No intake/output data recorded.  Lab Results: Recent Labs    11/07/20 1528 11/08/20 0950 11/09/20 0421  WBC 10.6* 8.0 9.7  HGB 6.8* 7.5* 8.5*  HCT 22.2* 23.7* 28.3*  PLT 266 157 148*   BMET Recent Labs    11/07/20 1528 11/08/20 0950 11/09/20 0421  NA 157* 155* 154*  K 4.4 4.5 3.5  CL 124* 127* 126*  CO2 20* 18* 16*  GLUCOSE 134* 123* 129*  BUN 92* 86* 83*  CREATININE 3.76* 3.24* 3.10*  CALCIUM 9.2 8.1* 8.3*   LFT Recent Labs    11/07/20 1528 11/08/20 0950 11/09/20 0421  PROT 7.3 6.4* 6.5  ALBUMIN 2.2* 2.0* 2.9*  AST 74* 59* 53*  ALT 32 27 22  ALKPHOS 88 73 60  BILITOT 2.3* 1.7* 1.8*   PT/INR Recent Labs    11/08/20 0950  LABPROT 21.5*  INR 1.9*    Assessment: 86-year-old male with numerous comorbidities including afib on Eliquis, CKD, chronic heart failure with AICD and EF 20-25% (Sept 2021), admitted with sepsis in setting of UTI, found to also have melena on presentation and  acute blood loss anemia.   Acute blood loss anemia: review of epic with Hgb past few months 8/9 range, presenting with Hgb 6.8 this admission in setting of Eliquis. Received 3 units PRBCs this admission, with Hgb holding steady this morning and improved from yesterday to 8.9.  One episode of melena overnight, which seems to be slowing down per nursing staff. Eliquis has been on hold since presentation 12/10.   Colitis on CT: involving mid to distal sigmoid colon. No diarrhea. Continue with antibiotics. Recommend direct colon visualization as outpatient once recovers from acute illness.   Plan: NPO Continue to hold Eliquis EGD with Dr. Carver planned for today. Discussed with his wife, as patient is unable to give verbal consent. Risks and benefits discussed with stated understanding.  Continue Protonix infusion  Further recommendations to follow   Athan Casalino W. Kemyra August, PhD, ANP-BC [facility]        LOS: 3 days    11/10/2020, 8:44 AM    

## 2020-11-11 DIAGNOSIS — R7989 Other specified abnormal findings of blood chemistry: Secondary | ICD-10-CM

## 2020-11-11 DIAGNOSIS — R933 Abnormal findings on diagnostic imaging of other parts of digestive tract: Secondary | ICD-10-CM

## 2020-11-11 DIAGNOSIS — Z7189 Other specified counseling: Secondary | ICD-10-CM

## 2020-11-11 DIAGNOSIS — K922 Gastrointestinal hemorrhage, unspecified: Secondary | ICD-10-CM

## 2020-11-11 LAB — CBC
HCT: 31.5 % — ABNORMAL LOW (ref 39.0–52.0)
Hemoglobin: 9.8 g/dL — ABNORMAL LOW (ref 13.0–17.0)
MCH: 32 pg (ref 26.0–34.0)
MCHC: 31.1 g/dL (ref 30.0–36.0)
MCV: 102.9 fL — ABNORMAL HIGH (ref 80.0–100.0)
Platelets: 121 10*3/uL — ABNORMAL LOW (ref 150–400)
RBC: 3.06 MIL/uL — ABNORMAL LOW (ref 4.22–5.81)
RDW: 21.5 % — ABNORMAL HIGH (ref 11.5–15.5)
WBC: 6.7 10*3/uL (ref 4.0–10.5)
nRBC: 0 % (ref 0.0–0.2)

## 2020-11-11 LAB — COMPREHENSIVE METABOLIC PANEL
ALT: 26 U/L (ref 0–44)
AST: 59 U/L — ABNORMAL HIGH (ref 15–41)
Albumin: 2.5 g/dL — ABNORMAL LOW (ref 3.5–5.0)
Alkaline Phosphatase: 61 U/L (ref 38–126)
Anion gap: 7 (ref 5–15)
BUN: 57 mg/dL — ABNORMAL HIGH (ref 8–23)
CO2: 21 mmol/L — ABNORMAL LOW (ref 22–32)
Calcium: 7.9 mg/dL — ABNORMAL LOW (ref 8.9–10.3)
Chloride: 123 mmol/L — ABNORMAL HIGH (ref 98–111)
Creatinine, Ser: 2.14 mg/dL — ABNORMAL HIGH (ref 0.61–1.24)
GFR, Estimated: 30 mL/min — ABNORMAL LOW (ref >60)
Glucose, Bld: 128 mg/dL — ABNORMAL HIGH (ref 70–99)
Potassium: 3.5 mmol/L (ref 3.5–5.1)
Sodium: 151 mmol/L — ABNORMAL HIGH (ref 135–145)
Total Bilirubin: 2.1 mg/dL — ABNORMAL HIGH (ref 0.3–1.2)
Total Protein: 6.1 g/dL — ABNORMAL LOW (ref 6.5–8.1)

## 2020-11-11 LAB — BILIRUBIN, FRACTIONATED(TOT/DIR/INDIR)
Bilirubin, Direct: 0.5 mg/dL — ABNORMAL HIGH (ref 0.0–0.2)
Indirect Bilirubin: 1.5 mg/dL — ABNORMAL HIGH (ref 0.3–0.9)
Total Bilirubin: 2 mg/dL — ABNORMAL HIGH (ref 0.3–1.2)

## 2020-11-11 LAB — MAGNESIUM: Magnesium: 2.6 mg/dL — ABNORMAL HIGH (ref 1.7–2.4)

## 2020-11-11 MED ORDER — ROSUVASTATIN CALCIUM 10 MG PO TABS: 10.0000 mg | ORAL_TABLET | Freq: Every day | ORAL | Status: DC

## 2020-11-11 MED ORDER — SODIUM CHLORIDE 0.9 % IV SOLN
2.0000 g | INTRAVENOUS | Status: DC
  Administered 2020-11-11 – 2020-11-12 (×2): 2 g via INTRAVENOUS
  Administered 2020-11-13: 12:00:00 1 g via INTRAVENOUS
  Administered 2020-11-14 – 2020-11-15 (×2): 2 g via INTRAVENOUS
  Filled 2020-11-11 (×5): qty 20

## 2020-11-11 MED ORDER — APIXABAN 2.5 MG PO TABS
2.5000 mg | ORAL_TABLET | Freq: Two times a day (BID) | ORAL | Status: DC
  Administered 2020-11-11 – 2020-11-13 (×4): 2.5 mg via ORAL
  Filled 2020-11-11 (×4): qty 1

## 2020-11-11 MED ORDER — DRONABINOL 5 MG PO CAPS
5.0000 mg | ORAL_CAPSULE | Freq: Two times a day (BID) | ORAL | Status: DC
  Administered 2020-11-12 – 2020-11-15 (×6): 5 mg via ORAL
  Filled 2020-11-11 (×9): qty 1

## 2020-11-11 MED ORDER — CEFTRIAXONE SODIUM IN DEXTROSE 40 MG/ML IV SOLN
2.0000 g | INTRAVENOUS | Status: DC
  Filled 2020-11-11 (×2): qty 50

## 2020-11-11 MED ORDER — AMIODARONE HCL 200 MG PO TABS: 100.0000 mg | ORAL_TABLET | Freq: Every day | ORAL | Status: DC

## 2020-11-11 MED ORDER — METRONIDAZOLE 500 MG PO TABS
500.0000 mg | ORAL_TABLET | Freq: Three times a day (TID) | ORAL | Status: DC
  Administered 2020-11-11 – 2020-11-15 (×13): 500 mg via ORAL
  Filled 2020-11-11 (×14): qty 1

## 2020-11-11 MED ORDER — SERTRALINE HCL 50 MG PO TABS
25.0000 mg | ORAL_TABLET | Freq: Every day | ORAL | Status: DC
  Administered 2020-11-11 – 2020-11-15 (×5): 25 mg via ORAL
  Filled 2020-11-11 (×5): qty 1

## 2020-11-11 MED ORDER — FINASTERIDE 5 MG PO TABS: 5.0000 mg | ORAL_TABLET | Freq: Every day | ORAL | Status: DC

## 2020-11-11 MED ORDER — LEVOTHYROXINE SODIUM 25 MCG PO TABS
125.0000 ug | ORAL_TABLET | Freq: Every day | ORAL | Status: DC
  Administered 2020-11-12 – 2020-11-15 (×4): 125 ug via ORAL
  Filled 2020-11-11 (×4): qty 1

## 2020-11-11 NOTE — Consult Note (Signed)
Consultation Note Date: 11/11/2020   Patient Name: Ryan Macdonald  DOB: Apr 25, 1934  MRN: [id number]  Age / Sex: 86 y.o., male  PCP: Ryan Morales, DO Referring Physician: Kathie Dike, MD  Reason for Consultation: Establishing goals of care  HPI/Patient Profile: 86 y.o. male  with past medical history of CHF with EF of 20 to 25%, A. fib, and stage III chronic kidney disease,  admitted on 11/07/2020 with increasing lethargy and decreased oral intake.  Per his spouse he was sent here in order to be transferred to [facility] for a feeding tube.  However work-up revealed anemia with a hemoglobin of 6.8, acute kidney injury, UTI.  GI consulted and an EGD was conducted showing a cirrhosis of esophagitis and gastritis.  Being treated with IV fluid resuscitation and antibiotics.  Showing great improvement in his mental status and oral intake.  Palliative medicine consulted for goals of care.  Clinical Assessment and Goals of Care: I met at the bedside with patient and his spouse. Ryan Macdonald used to enjoy going fishing, however he is now did not unable to do that.  Prior to this admission he was living at the [facility] dependent for all ADLs.  His wife notes that he has not walked since probably early October.  His wife expressed frustration with Ryan Macdonald care at the [facility].  Her feeling is that if he had been fed appropriately and cared for appropriately than he would not be in this current situation. We discussed goals of care, both patient and his spouse express goals of care to be ongoing medical treatment and would like for him to work with physical therapy in order to achieve him being able to get out of bed and possibly walk again.  Their eventual goal would be for patient to return home. We discussed the chronic and progressive nature of his illness particularly his heart failure  with a low ejection fraction. A MOST form was completed-selections were DNR, limited medical interventions, antibiotics if indicated, IV fluids if indicated, no feeding tube.    Primary Decision Maker PATIENT  And his spouse    SUMMARY OF RECOMMENDATIONS -Continue current scope of care -MOST form completed original placed on chart and copy given to spouse with selections of DNR, limited interventions, IV fluids, antibiotics, no feeding tube -Recommend follow-up by outpatient palliative at facility -Patient's spouse would prefer for patient to go to a facility other than the [facility]:  DNR  Additional Recommendations (Limitations, Scope, Preferences):  Full Scope Treatment   Prognosis:    Unable to determine  Discharge Planning: To Be Determined  Primary Diagnoses: Present on Admission: . Acute renal failure superimposed on stage 3 chronic kidney disease ([place]) . Chronic respiratory failure with hypoxia ([place]) . Diabetes mellitus type 2 in obese ([place]) . Essential hypertension . Hyperlipidemia . ICD (implantable cardioverter-defibrillator) in place . Lactic acidosis . PAF (paroxysmal atrial fibrillation) ([place]) . Protein-calorie malnutrition, severe ([place])   I have reviewed the medical  record, interviewed the patient and family, and examined the patient. The following aspects are pertinent.  Past Medical History:  Diagnosis Date  . Acute bronchitis 05/10/2017  . Acute respiratory failure ([place])   . Acute respiratory failure with hypoxia ([place]) 09/27/2017  . AKI (acute kidney injury) ([place]) 02/2019  . AKI (acute kidney injury) ([place]) 03/14/2019  . Atrial fibrillation ([place])   . CAP (community acquired pneumonia) 02/03/2017  . Cardiomyopathy ([place])   . Cat scratch fever    removed mass on right side neck  . Cataracts, bilateral   . CHF (congestive heart failure) ([place])   . CHF exacerbation ([place]) 09/27/2017  . Chronic kidney disease   .  Corns and callosities   . Diabetes mellitus without complication ([place])    TYPE 2  . Dyspnea   . Erythema intertrigo   . Flat foot   . Gout   . High cholesterol   . Hx of urethral stricture   . Hypertension   . Kidney stones   . Morbid obesity ([place])   . Nonischemic cardiomyopathy ([place])    a. ? 2009 Cath in [place] - nl cors per pt;  b. 09/2013 Echo: EF 25-30%, sev diff HK.  . Obesity   . Osteoarthritis   . PAF (paroxysmal atrial fibrillation) ([place])    a. post-op hip in 2014 - prev on xarelto.  . Renal insufficiency    Social History   Socioeconomic History  . Marital status: Married    Spouse name: Not on file  . Number of children: Not on file  . Years of education: Not on file  . Highest education level: Not on file  Occupational History  . Occupation: Retired    Comment: Former Corporate treasurer  Tobacco Use  . Smoking status: Former Smoker    Packs/day: 1.00    Years: 15.00    Pack years: 15.00    Types: Cigarettes, Cigars  . Smokeless tobacco: Former Systems developer  . Tobacco comment: quit smoking ~ 50 yr ago  Vaping Use  . Vaping Use: Never used  Substance and Sexual Activity  . Alcohol use: No    Alcohol/week: 2.0 standard drinks    Types: 2 Cans of beer per week    Comment: rare beer.  . Drug use: No  . Sexual activity: Yes  Other Topics Concern  . Not on file  Social History Narrative   Lives in [place] with wife. Moved from [place] (2013). Does not routinely exercise.   Social Determinants of Health   Financial Resource Strain: Not on file  Food Insecurity: Not on file  Transportation Needs: Not on file  Physical Activity: Not on file  Stress: Not on file  Social Connections: Not on file   Scheduled Meds: . sodium chloride   Intravenous Once  . sodium chloride   Intravenous Once  . Chlorhexidine Gluconate Cloth  6 each Topical Q0600  . metroNIDAZOLE  500 mg Oral Q8H  . mupirocin ointment  1 application Nasal BID  . pantoprazole  40 mg Intravenous Q12H  . sucralfate   1 g Oral TID WC & HS   Continuous Infusions: . sodium chloride    . cefTRIAXone (ROCEPHIN)  IV 2 g (11/11/20 1158)  . dextrose 5 % with KCl 20 mEq / L 20 mEq (11/11/20 1443)   PRN Meds:. Medications Prior to Admission:  Prior to Admission medications   Medication Sig Start Date End Date Taking? Authorizing Provider  acetaminophen (TYLENOL) 325 MG tablet Take  650 mg by mouth every 6 (six) hours as needed.   Yes [provider]  apixaban (ELIQUIS) 2.5 MG TABS tablet Take 1 tablet (2.5 mg total) by mouth 2 (two) times daily. 03/27/20  Yes Larey Dresser, MD  Ascorbic Acid (VITAMIN C) 500 MG CAPS Take 500 mg by mouth in the morning and at bedtime.   Yes [provider]  diclofenac Sodium (VOLTAREN) 1 % GEL Apply 2 g topically 4 (four) times daily.   Yes [provider]  dronabinol (MARINOL) 5 MG capsule Take 1 capsule (5 mg total) by mouth 2 (two) times daily before lunch and supper. 10/12/20  Yes Allie Bossier, MD  HYDROcodone-acetaminophen (NORCO/VICODIN) 5-325 MG tablet Take 1 tablet by mouth 3 (three) times daily as needed for severe pain. 08/21/20  Yes Bonnielee Haff, MD  lidocaine (LIDODERM) 5 % Place 1 patch onto the skin daily. Remove & Discard patch within 12 hours or as directed by MD 08/22/20  Yes Bonnielee Haff, MD  ondansetron (ZOFRAN) 4 MG tablet Take 1 tablet (4 mg total) by mouth every 6 (six) hours as needed for nausea. 10/12/20  Yes Allie Bossier, MD  polyethylene glycol (MIRALAX / GLYCOLAX) 17 g packet Take 17 g by mouth 2 (two) times daily. 02/24/20  Yes Sheikh, Omair Latif, DO  Polysaccharide Iron Complex (POLY-IRON 150 PO) Take 1 capsule by mouth in the morning.   Yes [provider]  saccharomyces boulardii (FLORASTOR) 250 MG capsule Take 1 capsule (250 mg total) by mouth 2 (two) times daily. 08/21/20  Yes Bonnielee Haff, MD  sertraline (ZOLOFT) 25 MG tablet Take 25 mg by mouth daily.   Yes [provider]  spironolactone  (ALDACTONE) 25 MG tablet Take 0.5 tablets (12.5 mg total) by mouth at bedtime. 10/12/20  Yes Allie Bossier, MD  traZODone (DESYREL) 50 MG tablet Take 0.5 tablets (25 mg total) by mouth at bedtime as needed for sleep. 10/12/20  Yes Allie Bossier, MD  Zinc 50 MG CAPS Take 50 mg by mouth daily.   Yes [provider]  amiodarone (PACERONE) 100 MG tablet Take 1 tablet (100 mg total) by mouth daily. Patient not taking: No sig reported 10/12/20   Allie Bossier, MD  benzonatate (TESSALON) 100 MG capsule Take 1 capsule (100 mg total) by mouth 3 (three) times daily as needed for cough. Patient not taking: No sig reported 08/21/20   Bonnielee Haff, MD  cyanocobalamin 1000 MCG tablet Take 1 tablet (1,000 mcg total) by mouth daily. Patient not taking: No sig reported 03/13/19   Larey Dresser, MD  feeding supplement (ENSURE ENLIVE / ENSURE PLUS) LIQD Take 237 mLs by mouth 2 (two) times daily between meals. 09/25/20   Mikhail, Velta Addison, DO  finasteride (PROSCAR) 5 MG tablet Take 1 tablet (5 mg total) by mouth daily. Patient not taking: No sig reported 11/29/18   Georgiana Shore, NP  levothyroxine (SYNTHROID) 125 MCG tablet Take 1 tablet (125 mcg total) by mouth daily before breakfast. Patient not taking: No sig reported 08/01/19   Philemon Kingdom, MD  magnesium oxide (MAG-OX) 400 MG tablet Take 400 mg by mouth daily.     [provider]  Multiple Vitamin (MULTIVITAMIN WITH MINERALS) TABS tablet Take 1 tablet by mouth daily. Patient not taking: No sig reported 09/26/20   Cristal Ford, DO  rosuvastatin (CRESTOR) 10 MG tablet Take 1 tablet (10 mg total) by mouth daily at 6 PM. Patient not taking: No sig  reported 12/22/18   Skeet Latch, MD   Allergies  Allergen Reactions  . Lisinopril Swelling    Facial swelling  . Xarelto [Rivaroxaban] Other (See Comments)    "Blood came out of my penis"   Review of Systems  Unable to perform ROS: Mental status change    Physical  Exam Vitals and nursing note reviewed.  Constitutional:      Comments: Appears frail  Pulmonary:     Effort: Pulmonary effort is normal.  Neurological:     Mental Status: He is alert.     Motor: Weakness present.     Vital Signs: BP (!) 101/44   Pulse (!) 56   Temp 97.7 F (36.5 C) (Oral)   Resp 19   Ht 5' 7"  (1.702 m)   Wt 75 kg   SpO2 99%   BMI 25.90 kg/m  Pain Scale: Faces   Pain Score: 0-No pain   SpO2: SpO2: 99 % O2 Device:SpO2: 99 % O2 Flow Rate: .O2 Flow Rate (L/min): 4 L/min  IO: Intake/output summary:   Intake/Output Summary (Last 24 hours) at 11/11/2020 1519 Last data filed at 11/11/2020 1335 Gross per 24 hour  Intake 1984.14 ml  Output 450 ml  Net 1534.14 ml    LBM: Last BM Date: 11/09/20 Baseline Weight: Weight: 85.3 kg Most recent weight: Weight: 75 kg     Palliative Assessment/Data: PPS: 30%     Thank you for this consult. Palliative medicine will continue to follow and assist as needed.   Time In: 1412 Time Out: 1549 Time Total: 97 mins Greater than 50%  of this time was spent counseling and coordinating care related to the above assessment and plan.  Signed by: Mariana Kaufman, AGNP-C Palliative Medicine    Please contact Palliative Medicine Team phone at [phone] for questions and concerns.  For individual provider: See Shea Evans

## 2020-11-11 NOTE — Progress Notes (Signed)
Subjective: No complaints. Denies abdominal pain, nausea, or vomiting. No overt bleeding per nursing staff. Had "multiple" formed BMs yesterday per nursing staff although only 1 stool was documented. 2 liquid BMs this morning that were green. No vomiting. Not eating well.   Objective: Vital signs in last 24 hours: Temp:  [97.2 F (36.2 C)-98.4 F (36.9 C)] 97.7 F (36.5 C) (12/14 1113) Pulse Rate:  [55-81] 56 (12/14 1113) Resp:  [15-22] 19 (12/14 1113) BP: (89-112)/(32-56) 101/44 (12/14 0500) SpO2:  [96 %-100 %] 99 % (12/14 1113) Weight:  [75 kg] 75 kg (12/14 0500) Last BM Date: 11/09/20 General:   Alert and oriented to self Head:  Normocephalic and atraumatic. Eyes:  No icterus Abdomen:  Bowel sounds present. Abdomen is full but soft and non-tender. No HSM or hernias noted. No rebound or guarding.  Extremities:  Without edema. Heal protective booties in place.  Neurologic:  Alert and oriented to self.   Intake/Output from previous day: 12/13 0701 - 12/14 0700 In: 1744.1 [I.V.:1204.6; IV Piggyback:539.6] Out: 750 [Urine:750] Intake/Output this shift: Total I/O In: 240 [P.O.:240] Out: -   Lab Results: Recent Labs    11/09/20 0421 11/10/20 0841 11/11/20 0404  WBC 9.7 7.0 6.7  HGB 8.5* 8.9* 9.8*  HCT 28.3* 28.2* 31.5*  PLT 148* 132* 121*   BMET Recent Labs    11/09/20 0421 11/10/20 0841 11/11/20 0404  NA 154* 155* 151*  K 3.5 2.8* 3.5  CL 126* 125* 123*  CO2 16* 21* 21*  GLUCOSE 129* 117* 128*  BUN 83* 69* 57*  CREATININE 3.10* 2.53* 2.14*  CALCIUM 8.3* 7.8* 7.9*   LFT Recent Labs    11/09/20 0421 11/10/20 0841 11/11/20 0404  PROT 6.5 5.9* 6.1*  ALBUMIN 2.9* 2.6* 2.5*  AST 53* 56* 59*  ALT 22 24 26   ALKPHOS 60 61 61  BILITOT 1.8* 2.2* 2.0*   2.1*  BILIDIR  --   --  0.5*  IBILI  --   --  1.5*    Assessment: 86 year old male with numerous comorbidities including afib on Eliquis, CKD, chronic heart failure with AICD and EF 20-25% (Sept 2021),  admitted with sepsis in setting of UTI, found to also have melena on presentation and acute blood loss anemia.   Acute blood loss anemia: review of epic with Hgb past few months 8/9 range, presenting with Hgb 6.8 this admission in setting of Eliquis. Eliquis has been on hold. EGD 12/13 with LA grade C erosive esophagitis without bleeding, severe gastritis without bleeding, normal examined duodenum.  PPI infusion completed yesterday. He was transitioned to IV Protonix 40 mg twice daily and Carafate 4 times daily was added. He has received Received 3 units PRBCs this admission (12/10 and 12/11). Hgb this morning 9.8.   Colitis on CT: Involving mid to distal sigmoid colon. No diarrhea on admission. Now with a couple watery BMs today possibly secondary to antibiotics. Will need to monitor for ongoing diarrhea. If this persist, obtain stool studies. He will need colon visualization as outpatient once recovers from acute illness. Continue current antibiotics for now.   Elevated LFTs: Appears they are continuing to improve following acute elevation in the setting of pancreatitis in October/November this year. Prior evaluation with acute hepatitis panel negative. Ferritin wnl. No cirrhosis on prior imaging (14/11 and CT). CT this admission with normal appearing pancreas, liver, biliary tree, and gallbladder. Will fractionate bilirubin as this seems to be increasing a bit, 2.2 today. No abdominal pain, N/V.  Could have component of [place] with elevated bilirubin in the setting of acute illness.   Plan: 1.  Check H. Pylori IgG. If positive, will need to treat. 2.  Fractionate bilirubin.  3.  Continue PPI BID 4.  Continue Carafate QID 5.  Monitor for overt GI bleeding.  6.  Should be able to resume Eliquis. Will discuss with Dr. Levon Hedger. Will need close monitoring of H/H going forward. If decline in hemoglobin or melena, will need to consider Givens capsule. Continue to monitor H/H 7.  Monitor for ongoing  diarrhea. If persistent, order C. Diff and GI pathogen panel.  8.  Outpatient colonoscopy once over acute illness.     LOS: 4 days    11/11/2020, 2:57 PM   Ermalinda Memos, [facility]

## 2020-11-11 NOTE — Progress Notes (Signed)
PROGRESS NOTE    Ryan Macdonald  YBW:[id number] DOB: 01/02/1934 DOA: 11/07/2020 PCP: Sherol Dade, DO    Brief Narrative:  86 year old male with multiple medical problems including chronic systolic heart failure, EF of 20 to 25%, paroxysmal atrial fibrillation, chronic kidney disease stage IIIb, is brought to the emergency room with increasing lethargy and decreased p.o. intake.  He was noted to be severely anemic with a hemoglobin of 6.8, had acute kidney injury with a creatinine of 3.7 and a sodium of 157.  Patient was transfused PRBC and GI consulted.  Urinalysis indicates possible infection he is on antibiotics he is being rehydrated with IV fluids   Assessment & Plan:   Active Problems:   Nonischemic cardiomyopathy (HCC)   Essential hypertension   Hyperlipidemia   PAF (paroxysmal atrial fibrillation) (HCC)   ICD (implantable cardioverter-defibrillator) in place   Diabetes mellitus type 2 in obese (HCC)   Lactic acidosis   Chronic respiratory failure with hypoxia (HCC)   Acute renal failure superimposed on stage 3 chronic kidney disease (HCC)   Protein-calorie malnutrition, severe (HCC)   Pressure injury of skin   Acute GI bleeding   Abnormal CT scan, colon   Elevated LFTs   Acute blood loss anemia -Secondary to GI bleeding -Patient noted to have melena, FOBT positive -Hemoglobin 6.8 on admission -He has received a total of 3 units of PRBCs thus far with follow-up hemoglobin has been stable -Continue to follow hemoglobin and transfuse as needed -Seen by GI and underwent EGD that showed erosive esophagitis without bleeding -Recommendations were to continue on PPI twice daily as well as Carafate  Acute kidney injury on chronic kidney disease stage IIIb -Likely related to volume depletion and dehydration -No hydronephrosis reported on imaging -Continue IV fluids, monitor urine output -Renal function is improving  Hypotension -Suspect this is related to  hypovolemia secondary to severe dehydration as well as GI bleeding -Continue to hold antihypertensives -Continue volume resuscitation -He also received albumin infusions since he is severely hypoalbuminemic and would likely third space IV fluids  Lactic acidosis -Related to dehydration and hypovolemia -Continue IV fluid resuscitation  Paroxysmal atrial fibrillation -Restart amiodarone -Restart Eliquis per GI  Hypernatremia -Related to severe dehydration and free water deficit -Continue hypotonic fluids  Urinary tract infection -Urinalysis indicates possible infection -Based on sepsis criteria on admission, sepsis has been ruled out -He is on intravenous antibiotics -Urine culture shows multiple species  Possible colitis -Noted on CT abdomen -Continue on cefepime and Flagyl  Acute metabolic encephalopathy -Suspect this is related to severe dehydration, hypernatremia -Overall mental status appears to be slowly improving.  He does wake up to voice and does engage in some conversation, although he remains confused  Chronic systolic congestive heart failure/nonischemic cardiomyopathy -Ejection fraction of 20 to 25%, AICD in place -Holding diuretics in the setting of hypotension/renal failure -Continue to monitor volume status  Severe protein calorie malnutrition -P.o. intake has been poor -Nutrition consult -It is reported that patient was taking nectar thick liquids at the nursing facility -He will be started on a pured diet and nectar thick liquids until he can be seen by speech therapy  Hypothyroidism -Continue on Synthroid  Hypokalemia -Replace  Goals of care -Discussed advanced directives with patient's wife Bluford Kubes -She has discussed CODE STATUS with patient's children and all are in agreement for DNR status -Patient seen by palliative care to address further goals of care -Decision made against feeding tube -We will need to monitor p.o. intake,  if he  does not have significant intake, then he may be more appropriate for hospice    DVT prophylaxis: SCDs Start: 11/07/20 2053  Code Status: full code Family Communication: Discussed with patient wife on 12/12, left voicemail 12/14 Disposition Plan: Status is: Inpatient  Remains inpatient appropriate because:Inpatient level of care appropriate due to severity of illness   Dispo: The patient is from: SNF              Anticipated d/c is to: SNF              Anticipated d/c date is: 2 days              Patient currently is not medically stable to d/c.    Consultants:   Gastroenterology  Procedures:   EGD  Antimicrobials:   Cefepime 12/10>  Flagyl 12/10>  Vancomycin 12/10>12/11    Subjective: Sleeping on my arrival, wakes up to voice  Objective: Vitals:   11/11/20 0800 11/11/20 0900 11/11/20 1113 11/11/20 1614  BP: (!) 97/45 (!) 93/35    Pulse: (!) 58 (!) 58 (!) 56 70  Resp: (!) 22  Temp:   97.7 F (36.5 C) 97.6 F (36.4 C)  TempSrc:   Oral Oral  SpO2: 99% 98% 99% 100%  Weight:      Height:        Intake/Output Summary (Last 24 hours) at 11/11/2020 1938 Last data filed at 11/11/2020 1600 Gross per 24 hour  Intake 1140 ml  Output 350 ml  Net 790 ml   Filed Weights   11/09/20 0437 11/10/20 0441 11/11/20 0500  Weight: 73.2 kg 72.5 kg 75 kg    Examination:  General exam: Alert, awake, no distress Respiratory system: Clear to auscultation. Respiratory effort normal. Cardiovascular system:RRR. No murmurs, rubs, gallops. Gastrointestinal system: Abdomen is nondistended, soft and nontender. No organomegaly or masses felt. Normal bowel sounds heard. Central nervous system: No focal neurological deficits. Extremities: bilateral feet in prevalon boots Skin: No rashes, lesions or ulcers Psychiatry: confused, calm      Data Reviewed: I have personally reviewed following labs and imaging studies  CBC: Recent Labs  Lab 11/07/20 1528  11/08/20 0950 11/09/20 0421 11/10/20 0841 11/11/20 0404  WBC 10.6* 8.0 9.7 7.0 6.7  NEUTROABS 8.6*  --   --   --   --   HGB 6.8* 7.5* 8.5* 8.9* 9.8*  HCT 22.2* 23.7* 28.3* 28.2* 31.5*  MCV 111.0* 106.3* 104.4* 100.7* 102.9*  PLT 266 157 148* 132* 121*   Basic Metabolic Panel: Recent Labs  Lab 11/07/20 1528 11/08/20 0950 11/09/20 0421 11/10/20 0841 11/11/20 0404  NA 157* 155* 154* 155* 151*  K 4.4 4.5 3.5 2.8* 3.5  CL 124* 127* 126* 125* 123*  CO2 20* 18* 16* 21* 21*  GLUCOSE 134* 123* 129* 117* 128*  BUN 92* 86* 83* 69* 57*  CREATININE 3.76* 3.24* 3.10* 2.53* 2.14*  CALCIUM 9.2 8.1* 8.3* 7.8* 7.9*  MG  --   --   --   --  2.6*   GFR: Estimated Creatinine Clearance: 24 mL/min (A) (by C-G formula based on SCr of 2.14 mg/dL (H)). Liver Function Tests: Recent Labs  Lab 11/07/20 1528 11/08/20 0950 11/09/20 0421 11/10/20 0841 11/11/20 0404  AST 74* 59* 53* 56* 59*  ALT 32 ALKPHOS 88 73 60 61 61  BILITOT 2.3* 1.7* 1.8* 2.2* 2.0*  2.1*  PROT 7.3 6.4* 6.5 5.9* 6.1*  ALBUMIN  2.2* 2.0* 2.9* 2.6* 2.5*   No results for input(s): LIPASE, AMYLASE in the last 168 hours. No results for input(s): AMMONIA in the last 168 hours. Coagulation Profile: Recent Labs  Lab 11/08/20 0950  INR 1.9*   Cardiac Enzymes: No results for input(s): CKTOTAL, CKMB, CKMBINDEX, TROPONINI in the last 168 hours. BNP (last 3 results) No results for input(s): PROBNP in the last 8760 hours. HbA1C: No results for input(s): HGBA1C in the last 72 hours. CBG: Recent Labs  Lab 11/07/20 1418 11/09/20 0735 11/10/20 1535  GLUCAP 118* 113* 99   Lipid Profile: No results for input(s): CHOL, HDL, LDLCALC, TRIG, CHOLHDL, LDLDIRECT in the last 72 hours. Thyroid Function Tests: No results for input(s): TSH, T4TOTAL, FREET4, T3FREE, THYROIDAB in the last 72 hours. Anemia Panel: No results for input(s): VITAMINB12, FOLATE, FERRITIN, TIBC, IRON, RETICCTPCT in the last 72 hours. Sepsis  Labs: Recent Labs  Lab 11/07/20 1527 11/07/20 1735 11/08/20 0950  PROCALCITON  --   --  0.29  LATICACIDVEN 3.9* 2.6*  --     Recent Results (from the past 240 hour(s))  Resp Panel by RT-PCR (Flu A&B, Covid) Nasopharyngeal Swab     Status: None   Collection Time: 11/07/20  2:44 PM   Specimen: Nasopharyngeal Swab; Nasopharyngeal(NP) swabs in vial transport medium  Result Value Ref Range Status   SARS Coronavirus 2 by RT PCR NEGATIVE NEGATIVE Final    Comment: (NOTE) SARS-CoV-2 target nucleic acids are NOT DETECTED.  The SARS-CoV-2 RNA is generally detectable in upper respiratory specimens during the acute phase of infection. The lowest concentration of SARS-CoV-2 viral copies this assay can detect is 138 copies/mL. A negative result does not preclude SARS-Cov-2 infection and should not be used as the sole basis for treatment or other patient management decisions. A negative result may occur with  improper specimen collection/handling, submission of specimen other than nasopharyngeal swab, presence of viral mutation(s) within the areas targeted by this assay, and inadequate number of viral copies(<138 copies/mL). A negative result must be combined with clinical observations, patient history, and epidemiological information. The expected result is Negative.  Fact Sheet for Patients:  BloggerCourse.com  Fact Sheet for Healthcare Providers:  SeriousBroker.it  This test is no t yet approved or cleared by the [place] FDA and  has been authorized for detection and/or diagnosis of SARS-CoV-2 by FDA under an Emergency Use Authorization (EUA). This EUA will remain  in effect (meaning this test can be used) for the duration of the COVID-19 declaration under Section 564(b)(1) of the Act, 21 U.S.C.section 360bbb-3(b)(1), unless the authorization is terminated  or revoked sooner.       Influenza A by PCR NEGATIVE NEGATIVE Final    Influenza B by PCR NEGATIVE NEGATIVE Final    Comment: (NOTE) The Xpert Xpress SARS-CoV-2/FLU/RSV plus assay is intended as an aid in the diagnosis of influenza from Nasopharyngeal swab specimens and should not be used as a sole basis for treatment. Nasal washings and aspirates are unacceptable for Xpert Xpress SARS-CoV-2/FLU/RSV testing.  Fact Sheet for Patients: BloggerCourse.com  Fact Sheet for Healthcare Providers: SeriousBroker.it  This test is not yet approved or cleared by the [place] FDA and has been authorized for detection and/or diagnosis of SARS-CoV-2 by FDA under an Emergency Use Authorization (EUA). This EUA will remain in effect (meaning this test can be used) for the duration of the COVID-19 declaration under Section 564(b)(1) of the Act, 21 U.S.C. section 360bbb-3(b)(1), unless the authorization is terminated or revoked.  Performed at [facility], [address], [place], [place] [address]   Culture, blood (Routine X 2) w Reflex to ID Panel     Status: None (Preliminary result)   Collection Time: 11/07/20  3:28 PM   Specimen: BLOOD RIGHT FOREARM  Result Value Ref Range Status   Specimen Description   Final    BLOOD RIGHT FOREARM BOTTLES DRAWN AEROBIC AND ANAEROBIC   Special Requests Blood Culture adequate volume  Final   Culture   Final    NO GROWTH 3 DAYS Performed at [facility], [address], [place], [place] [address]    Report Status PENDING  Incomplete  Culture, blood (Routine X 2) w Reflex to ID Panel     Status: None (Preliminary result)   Collection Time: 11/07/20  3:59 PM   Specimen: Left Antecubital; Blood  Result Value Ref Range Status   Specimen Description LEFT ANTECUBITAL  Final   Special Requests   Final    BOTTLES DRAWN AEROBIC AND ANAEROBIC Blood Culture adequate volume   Culture   Final    NO GROWTH 3 DAYS Performed at [facility], [address], [place], [place] [address]     Report Status PENDING  Incomplete  Urine Culture     Status: Abnormal   Collection Time: 11/07/20  5:00 PM   Specimen: Urine, Clean Catch  Result Value Ref Range Status   Specimen Description   Final    URINE, CLEAN CATCH Performed at [facility], [address] [place], [place] [address]    Special Requests   Final    NONE Performed at [facility], [address], [place], [place] [address]    Culture MULTIPLE SPECIES PRESENT, SUGGEST RECOLLECTION (A)  Final   Report Status 11/09/2020 FINAL  Final  Culture, Urine     Status: Abnormal   Collection Time: 11/08/20  3:51 PM   Specimen: Urine, Clean Catch  Result Value Ref Range Status   Specimen Description   Final    URINE, CLEAN CATCH Performed at [facility], [address], [place], [place] [address]    Special Requests   Final    NONE Performed at [facility], [address], [place], [place] [address]    Culture MULTIPLE SPECIES PRESENT, SUGGEST RECOLLECTION (A)  Final   Report Status 11/10/2020 FINAL  Final  MRSA PCR Screening     Status: Abnormal   Collection Time: 11/08/20  4:00 PM   Specimen: Nasopharyngeal  Result Value Ref Range Status   MRSA by PCR POSITIVE (A) NEGATIVE Final    Comment:        The GeneXpert MRSA Assay (FDA approved for NASAL specimens only), is one component of a comprehensive MRSA colonization surveillance program. It is not intended to diagnose MRSA infection nor to guide or monitor treatment for MRSA infections. RESULT CALLED TO, READ BACK BY AND VERIFIED WITH: K LOOMIS AT 1836 ON 11/08/2020 BY MOSLEY,J Performed at [facility], [address] [place], [place] [address]          Radiology Studies: No results found.      Scheduled Meds: . sodium chloride   Intravenous Once  . sodium chloride   Intravenous Once  . Chlorhexidine Gluconate Cloth  6 each Topical Q0600  . metroNIDAZOLE  500 mg Oral Q8H  . mupirocin ointment  1 application Nasal BID  . pantoprazole  40 mg  Intravenous Q12H  . sucralfate  1 g Oral TID WC & HS  Continuous Infusions: . sodium chloride 20 mL/hr at 11/11/20 1827  . cefTRIAXone (ROCEPHIN)  IV Stopped (11/11/20 1230)  . dextrose 5 % with KCl 20 mEq / L 100 mL/hr at 11/11/20 1600     LOS: 4 days    Time spent:    Erick Blinks, MD [facility]   If 7PM-7AM, please contact night-coverage [email or web address]  11/11/2020, 7:38 PM

## 2020-11-12 DIAGNOSIS — N171 Acute kidney failure with acute cortical necrosis: Secondary | ICD-10-CM

## 2020-11-12 DIAGNOSIS — Z95810 Presence of automatic (implantable) cardiac defibrillator: Secondary | ICD-10-CM

## 2020-11-12 DIAGNOSIS — D696 Thrombocytopenia, unspecified: Secondary | ICD-10-CM

## 2020-11-12 LAB — COMPREHENSIVE METABOLIC PANEL
ALT: 23 U/L (ref 0–44)
AST: 51 U/L — ABNORMAL HIGH (ref 15–41)
Albumin: 2.2 g/dL — ABNORMAL LOW (ref 3.5–5.0)
Alkaline Phosphatase: 59 U/L (ref 38–126)
Anion gap: 6 (ref 5–15)
BUN: 40 mg/dL — ABNORMAL HIGH (ref 8–23)
CO2: 20 mmol/L — ABNORMAL LOW (ref 22–32)
Calcium: 7.6 mg/dL — ABNORMAL LOW (ref 8.9–10.3)
Chloride: 119 mmol/L — ABNORMAL HIGH (ref 98–111)
Creatinine, Ser: 1.71 mg/dL — ABNORMAL HIGH (ref 0.61–1.24)
GFR, Estimated: 39 mL/min — ABNORMAL LOW (ref >60)
Glucose, Bld: 111 mg/dL — ABNORMAL HIGH (ref 70–99)
Potassium: 3.7 mmol/L (ref 3.5–5.1)
Sodium: 145 mmol/L (ref 135–145)
Total Bilirubin: 1.4 mg/dL — ABNORMAL HIGH (ref 0.3–1.2)
Total Protein: 5.8 g/dL — ABNORMAL LOW (ref 6.5–8.1)

## 2020-11-12 LAB — CBC
HCT: 28.5 % — ABNORMAL LOW (ref 39.0–52.0)
Hemoglobin: 9.1 g/dL — ABNORMAL LOW (ref 13.0–17.0)
MCH: 32.6 pg (ref 26.0–34.0)
MCHC: 31.9 g/dL (ref 30.0–36.0)
MCV: 102.2 fL — ABNORMAL HIGH (ref 80.0–100.0)
Platelets: 121 10*3/uL — ABNORMAL LOW (ref 150–400)
RBC: 2.79 MIL/uL — ABNORMAL LOW (ref 4.22–5.81)
RDW: 20.9 % — ABNORMAL HIGH (ref 11.5–15.5)
WBC: 7.4 10*3/uL (ref 4.0–10.5)
nRBC: 0 % (ref 0.0–0.2)

## 2020-11-12 LAB — H. PYLORI ANTIBODY, IGG: H Pylori IgG: 3.63 Index Value — ABNORMAL HIGH (ref 0.00–0.79)

## 2020-11-12 MED ORDER — ACETAMINOPHEN 500 MG PO TABS
1000.0000 mg | ORAL_TABLET | Freq: Four times a day (QID) | ORAL | Status: DC | PRN
  Administered 2020-11-12 – 2020-11-14 (×2): 1000 mg via ORAL
  Filled 2020-11-12 (×2): qty 2

## 2020-11-12 MED ORDER — POTASSIUM CL IN DEXTROSE 5% 20 MEQ/L IV SOLN
20.0000 meq | INTRAVENOUS | Status: DC
  Administered 2020-11-12 – 2020-11-14 (×4): 20 meq via INTRAVENOUS

## 2020-11-12 MED ORDER — AMIODARONE HCL 200 MG PO TABS
100.0000 mg | ORAL_TABLET | Freq: Every day | ORAL | Status: DC
  Administered 2020-11-12 – 2020-11-15 (×4): 100 mg via ORAL
  Filled 2020-11-12 (×4): qty 1

## 2020-11-12 NOTE — Progress Notes (Signed)
Subjective:  No complaints. Discussed with nursing staff. Loose brown stool overnight with no overt bleeding. No vomiting. Eating well.   Objective: Vital signs in last 24 hours: Temp:  [97.5 F (36.4 C)-97.9 F (36.6 C)] 97.7 F (36.5 C) (12/15 0928) Pulse Rate:  [56-82] 64 (12/15 0900) Resp:  [16-26] 19 (12/15 0900) BP: (99-113)/(45-55) 103/48 (12/15 0900) SpO2:  [98 %-100 %] 99 % (12/15 0900) Last BM Date: 11/12/20 General:   Alert. Responds yes/no but not clear that answers are appropriate. NAD. Head:  Normocephalic and atraumatic. Eyes:  Sclera clear, no icterus.  Abdomen:  Soft, nontender and nondistended.  Normal bowel sounds, without guarding, and without rebound.   Extremities:  Without clubbing, deformity or edema. Neurologic:  Alert and oriented to self.    Intake/Output from previous day: 12/14 0701 - 12/15 0700 In: 1974.9 [P.O.:240; I.V.:1634.9; IV Piggyback:100] Out: 350 [Urine:350] Intake/Output this shift: Total I/O In: 763 [I.V.:763] Out: -   Lab Results: CBC Recent Labs    11/10/20 0841 11/11/20 0404 11/12/20 0507  WBC 7.0 6.7 7.4  HGB 8.9* 9.8* 9.1*  HCT 28.2* 31.5* 28.5*  MCV 100.7* 102.9* 102.2*  PLT 132* 121* 121*   BMET Recent Labs    11/10/20 0841 11/11/20 0404 11/12/20 0507  NA 155* 151* 145  K 2.8* 3.5 3.7  CL 125* 123* 119*  CO2 21* 21* 20*  GLUCOSE 117* 128* 111*  BUN 69* 57* 40*  CREATININE 2.53* 2.14* 1.71*  CALCIUM 7.8* 7.9* 7.6*   LFTs Recent Labs    11/10/20 0841 11/11/20 0404 11/12/20 0507  BILITOT 2.2* 2.0*   2.1* 1.4*  BILIDIR  --  0.5*  --   IBILI  --  1.5*  --   ALKPHOS 61 61 59  AST 56* 59* 51*  ALT 24 26 23   PROT 5.9* 6.1* 5.8*  ALBUMIN 2.6* 2.5* 2.2*   No results for input(s): LIPASE in the last 72 hours. PT/INR No results for input(s): LABPROT, INR in the last 72 hours.    Imaging Studies: CT ABDOMEN PELVIS WO CONTRAST  Result Date: 11/07/2020 CLINICAL DATA:  Melena with weakness and lethargy.  EXAM: CT ABDOMEN AND PELVIS WITHOUT CONTRAST TECHNIQUE: Multidetector CT imaging of the abdomen and pelvis was performed following the standard protocol without IV contrast. COMPARISON:  September 26, 2020 FINDINGS: Lower chest: Mild atelectasis and/or infiltrate is seen within the right lung base. There is a moderate sized right pleural effusion. A small left pleural effusion is also noted. Hepatobiliary: No focal liver abnormality is seen. No gallstones, gallbladder wall thickening, or biliary dilatation. Pancreas: Unremarkable. No pancreatic ductal dilatation or surrounding inflammatory changes. Spleen: Normal in size without focal abnormality. Adrenals/Urinary Tract: Adrenal glands are unremarkable. The left kidney is normal in size. Moderate severity diffuse cortical thinning with renal atrophy is seen on the right. A stable 1.6 cm diameter cyst is noted within the posterior aspect of the mid left kidney. A 2.7 mm staghorn calculus is seen on the right with additional innumerable subcentimeter non-obstructing renal stones present throughout the right kidney. A moderate amount of air is seen within the calices of the right kidney and proximal right ureter. This represents a new finding when compared to the prior exam. No perinephric or periureteral inflammatory fat stranding is seen. A mild-to-moderate amount of air is seen within the lumen of a urinary bladder diverticulum. A small amount of air is seen within this region on the prior study. Stomach/Bowel: Stomach is within normal limits.  The appendix is not clearly identified. No evidence of bowel dilatation. Mild to moderate severity thickening of the mid to distal sigmoid colon is seen (axial CT images 64 through 74, CT series number 2). Vascular/Lymphatic: Aortic atherosclerosis. No enlarged abdominal or pelvic lymph nodes. Reproductive: The prostate gland is poorly visualized secondary to overlying streak artifact. Other: A stable posterolateral right  abdominal wall hernia is seen. No abdominopelvic ascites. Musculoskeletal: A chronic eleventh right rib fracture is seen. A left hip replacement is seen with associated streak artifact and subsequently limited evaluation of the adjacent osseous and soft tissue structures. Multilevel degenerative changes seen throughout the lumbar spine. IMPRESSION: 1. Mild to moderate severity colitis involving the mid to distal sigmoid colon. Follow-up to resolution is recommended to exclude the presence of an underlying neoplasm. 2. Moderate amount of calyceal air within the right kidney and proximal right ureter which the secondary to recent instrumentation. The presence of an underlying infectious process cannot be excluded. Correlation with urinalysis is recommended. 3. Bilateral pleural effusions, right greater than left. 4. Atrophic right kidney with extensive right-sided nephrolithiasis. 5. Left hip replacement. 6. Aortic atherosclerosis. Aortic Atherosclerosis (ICD10-I70.0). Electronically Signed   By: Aram Candela M.D.   On: 11/07/2020 17:25   DG Chest 1 View  Result Date: 11/07/2020 CLINICAL DATA:  Altered mental status. EXAM: CHEST  1 VIEW COMPARISON:  September 20, 2020 FINDINGS: A single lead ventricular pacer is in place. There is no evidence of acute infiltrate. A small, stable right pleural effusion is noted. No pneumothorax is seen. The heart size and mediastinal contours are within normal limits. Degenerative changes are present throughout the thoracic spine. IMPRESSION: 1. Small, stable right pleural effusion. Electronically Signed   By: Aram Candela M.D.   On: 11/07/2020 15:34   CT Head Wo Contrast  Result Date: 11/07/2020 CLINICAL DATA:  Weakness and lethargy. EXAM: CT HEAD WITHOUT CONTRAST TECHNIQUE: Contiguous axial images were obtained from the base of the skull through the vertex without intravenous contrast. COMPARISON:  September 20, 2020 FINDINGS: Brain: There is mild cerebral atrophy  with widening of the extra-axial spaces and ventricular dilatation. There are areas of decreased attenuation within the white matter tracts of the supratentorial brain, consistent with microvascular disease changes. Vascular: No hyperdense vessel or unexpected calcification. Skull: Normal. Negative for fracture or focal lesion. Sinuses/Orbits: No acute finding. Other: None. IMPRESSION: 1. Generalized cerebral atrophy. 2. No acute intracranial abnormality. Electronically Signed   By: Aram Candela M.D.   On: 11/07/2020 15:59  [2 weeks]   Assessment: 86 year old male with numerous comorbidities including A. fib on Eliquis, CKD, chronic heart failure with AICD and EF of 20 to 25% (September 2021) admitted with sepsis in the setting of UTI, found to have melena on presentation and acute blood loss anemia.  Acute blood loss anemia: Hemoglobin a few months ago in the 8/9 range, presented with hemoglobin of 6.8 this admission in the setting of Eliquis.  Eliquis currently on hold.  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  Severe gastritis without bleeding, normal examined duodenum.  Completed PPI infusion.  Currently on IV Protonix 40 mg twice daily and Carafate 4 times daily.  Has received a total of 3 units of packed red blood cells this admission.  Today's hemoglobin 9.1, essentially stable.  Colitis on CT: Involving mid to distal sigmoid colon with  association of significant stool burden, felt to be nonspecific finding.  No diarrhea on admission.  He has had a couple of loose stools which may be secondary to antibiotic use.  Continue to monitor.  If significant diarrhea, consider stool studies.  Consider colonoscopy as an outpatient once he recovers from acute illness.  Elevated LFTs: Continue to improve.  Elevated more significantly back in  [place] of this year.  However mildly elevated AST noted in April 2021 along with mildly elevated AST and total bilirubin in September 2021.  Yesterday total bilirubin 2.0, indirect bilirubin 1.5.  Prior evaluation for acute hepatitis panel negative.  Ferritin within normal limits.  No cirrhosis on prior imaging (ultrasound and CT).  CT this admission with normal-appearing pancreas, liver, biliary tree, gallbladder.  No abdominal pain or vomiting.  Continue to monitor.  Thrombocytopenia: Initially normal platelet count of 266,000 on admission.  Down to 121,000 today.  Continue to monitor.  Plan: 1. Follow-up H. pylori serologies. 2. Follow closely for recurrent GI bleeding, drop in hemoglobin in the setting of Eliquis.  Consider Givens capsule if decline in hemoglobin, melena. 3. Continue PPI twice daily.  While inpatient continue IV pantoprazole twice daily. 4. Complete Carafate 10-day course. 5. Consider outpatient colonoscopy once over acute illness. 6. Monitor for ongoing diarrhea, if persistent, will check C. difficile and GI pathogen panel.  Leanna Battles. [facility] [phone] 12/15/202110:40 AM     LOS: 5 days

## 2020-11-12 NOTE — Progress Notes (Signed)
PROGRESS NOTE  Ryan Macdonald RAJ:[id number] DOB: [birth year] DOA: 11/07/2020 PCP: Sherol Dade, DO  Brief History:   86 y.o. male, malewith medical history significant ofHFrEF (EF 20 to 25%, AICD in place), paroxysmal A. fib on amiodarone and Eliquis, hypertension, chronic kidney disease 3B, hyperlipidemia on a statin, gout, hypothyroidism, and recent sepsis related to a UTI patient with multiple recent hospitalizations;  He was altered, could not provide any history, it was obtained from ED staff and wife by phone. -Patient was sent by [facility] for increased weakness, lethargy, and decreased oral intake, in ED patient was noted to be unresponsive at all, medicated provide any complaints, patient was noted to to have melena while in ED, Hemoccult came back positive, as well wife mentioned patient with poor oral intake recently, and there was discussion with facility staff about arranging for peg tube during his poor oral intake. -in ED evaluation was significant for melena, Hemoccult positive stool, with hemoglobin of 6.8, as well of sodium of 157, worsening creatinine and BUN, creatinine 3.76, BUN of 92, BNP elevated at 306, mildly elevated AST at 74, CT abdomen and pelvis was significant for colitis, as well there is some evidence of right pleural effusion, and UA was strongly positive, as well patient was noted to have soft blood pressure, [facility] hospitalist consulted to admit.  Assessment/Plan: Acute blood loss anemia -Secondary to GI bleeding -Patient noted to have melena, FOBT positive -Hemoglobin 6.8 on admission -He has received a total of 3 units of PRBCs thus far with follow-up hemoglobin has been stable -Continue to follow hemoglobin and transfuse as needed -11/10/20 EGD LA grade C erosive esophagitis without bleedingsevere gastritis without bleeding, normal examined duodenum;   -PPI infusion completed yesterday -continue on PPI twice daily as well as  Carafate  Acute kidney injury on chronic kidney disease stage IIIb -Likely related to volume depletion and hemodynamic changes -No hydronephrosis reported on imaging -Continue IV fluids, monitor urine output -baseline creatinine 1.4-1.7  Hypotension -Suspect this is related to hypovolemia secondary to severe dehydration as well as GI bleeding -Continue to hold antihypertensives -Continue volume resuscitation -He also received albumin infusions since he is severely hypoalbuminemic and would likely third space IV fluids  Lactic acidosis -present on admission -lactic acid peaked 3.9 -Related to dehydration and hypovolemia -Continue IV fluid resuscitation  Paroxysmal atrial fibrillation -Restart amiodarone -Restart Eliquis per GI  Hypernatremia -Related to severe dehydration and free water deficit -Continue hypotonic fluids  Urinary tract infection -Urinalysis indicates possible infection -Based on sepsis criteria on admission, sepsis has been ruled out -He is on intravenous antibiotics -Urine culture shows multiple species  Possible colitis -Noted on CT abdomen -Continue on cefepime and Flagyl  Acute metabolic encephalopathy -Suspect this is related to severe dehydration, hypernatremia -Overall mental status appears to be slowly improving.  He does wake up to voice and does engage in some conversation  Chronic systolic congestive heart failure/nonischemic cardiomyopathy -Ejection fraction of 20 to 25%, AICD in place -Holding diuretics in the setting of hypotension/renal failure -Continue to monitor volume status  Severe protein calorie malnutrition -P.o. intake has been poor -Nutrition consult -It is reported that patient was taking nectar thick liquids at the nursing facility -He will be started on a pured diet and nectar thick liquids until he can be seen by speech therapy -request repeat speech eval--hopefully to liberalize  diet  Hypothyroidism -Continue on Synthroid  Hypokalemia -Replace -check mag  Goals  of care -Discussed advanced directives with patient's wife Ryan Macdonald -She has discussed CODE STATUS with patient's children and all are in agreement for DNR status -Patient seen by palliative care to address further goals of care -Decision made against feeding tube -We will need to monitor p.o. intake, if he does not have significant intake, then he may be more appropriate for hospice       Status is: Inpatient  Remains inpatient appropriate because:IV treatments appropriate due to intensity of illness or inability to take PO   Dispo: The patient is from: SNF              Anticipated d/c is to: SNF              Anticipated d/c date is: 1 day              Patient currently is not medically stable to d/c.    Total time spent 35 minutes.  Greater than 50% spent face to face counseling and coordinating care.     Family Communication:  no Family at bedside  Consultants:  Palliative, GI  Code Status:  DNR  DVT Prophylaxis:  [place] Heparin / [place] Lovenox   Procedures: As Listed in Progress Note Above  Antibiotics: Ceftriaxone 12/14>> Metronidazole 12/11>> Cefepime 12/10>>12/14  RN Pressure Injury Documentation: Pressure Injury 11/08/20 Buttocks Left Stage 2 -  Partial thickness loss of dermis presenting as a shallow open injury with a red, pink wound bed without slough. (Active)  11/08/20 1400  Location: Buttocks  Location Orientation: Left  Staging: Stage 2 -  Partial thickness loss of dermis presenting as a shallow open injury with a red, pink wound bed without slough.  Wound Description (Comments):   Present on Admission: Yes     Pressure Injury 11/08/20 Sacrum Left Stage 2 -  Partial thickness loss of dermis presenting as a shallow open injury with a red, pink wound bed without slough. (Active)  11/08/20 1400  Location: Sacrum  Location Orientation: Left  Staging:  Stage 2 -  Partial thickness loss of dermis presenting as a shallow open injury with a red, pink wound bed without slough.  Wound Description (Comments):   Present on Admission: Yes     Pressure Injury 11/11/20 Groin Right Stage 2 -  Partial thickness loss of dermis presenting as a shallow open injury with a red, pink wound bed without slough. (Active)  11/11/20 0900  Location: Groin  Location Orientation: Right  Staging: Stage 2 -  Partial thickness loss of dermis presenting as a shallow open injury with a red, pink wound bed without slough.  Wound Description (Comments):   Present on Admission:         Subjective: Patient denies fevers, chills, headache, chest pain, dyspnea, nausea, vomiting, diarrhea, abdominal pain, dysuria, hematuria, hematochezia, and melena.   Objective: Vitals:   11/12/20 0500 11/12/20 0600 11/12/20 0700 11/12/20 0800  BP: (!) 113/48 (!) 103/46 (!) 112/55 (!) 108/51  Pulse: 65 64 72 64  Resp: 17 18 (!) 22 18  Temp: 97.9 F (36.6 C)     TempSrc: Axillary     SpO2: 100% 100% 100% 100%  Weight:      Height:        Intake/Output Summary (Last 24 hours) at 11/12/2020 0922 Last data filed at 11/12/2020 0710 Gross per 24 hour  Intake 2737.87 ml  Output 350 ml  Net 2387.87 ml   Weight change:  Exam:   General:  Pt is  alert, follows commands appropriately, not in acute distress  HEENT: No icterus, No thrush, No neck mass, [place]/AT  Cardiovascular: RRR, S1/S2, no rubs, no gallops  Respiratory: bibasilar crackles. No wheeze  Abdomen: Soft/+BS, non tender, non distended, no guarding  Extremities: 1+LE edema, No lymphangitis, No petechiae, No rashes, no synovitis   Data Reviewed: I have personally reviewed following labs and imaging studies Basic Metabolic Panel: Recent Labs  Lab 11/08/20 0950 11/09/20 0421 11/10/20 0841 11/11/20 0404 11/12/20 0507  NA 155* 154* 155* 151* 145  K 4.5 3.5 2.8* 3.5 3.7  CL 127* 126* 125* 123* 119*  CO2 18*  16* 21* 21* 20*  GLUCOSE 123* 129* 117* 128* 111*  BUN 86* 83* 69* 57* 40*  CREATININE 3.24* 3.10* 2.53* 2.14* 1.71*  CALCIUM 8.1* 8.3* 7.8* 7.9* 7.6*  MG  --   --   --  2.6*  --    Liver Function Tests: Recent Labs  Lab 11/08/20 0950 11/09/20 0421 11/10/20 0841 11/11/20 0404 11/12/20 0507  AST 59* 53* 56* 59* 51*  ALT ALKPHOS 73 60 61 61 59  BILITOT 1.7* 1.8* 2.2* 2.0*  2.1* 1.4*  PROT 6.4* 6.5 5.9* 6.1* 5.8*  ALBUMIN 2.0* 2.9* 2.6* 2.5* 2.2*   No results for input(s): LIPASE, AMYLASE in the last 168 hours. No results for input(s): AMMONIA in the last 168 hours. Coagulation Profile: Recent Labs  Lab 11/08/20 0950  INR 1.9*   CBC: Recent Labs  Lab 11/07/20 1528 11/08/20 0950 11/09/20 0421 11/10/20 0841 11/11/20 0404 11/12/20 0507  WBC 10.6* 8.0 9.7 7.0 6.7 7.4  NEUTROABS 8.6*  --   --   --   --   --   HGB 6.8* 7.5* 8.5* 8.9* 9.8* 9.1*  HCT 22.2* 23.7* 28.3* 28.2* 31.5* 28.5*  MCV 111.0* 106.3* 104.4* 100.7* 102.9* 102.2*  PLT 266 157 148* 132* 121* 121*   Cardiac Enzymes: No results for input(s): CKTOTAL, CKMB, CKMBINDEX, TROPONINI in the last 168 hours. BNP: Invalid input(s): POCBNP CBG: Recent Labs  Lab 11/07/20 1418 11/09/20 0735 11/10/20 1535  GLUCAP 118* 113* 99   HbA1C: No results for input(s): HGBA1C in the last 72 hours. Urine analysis:    Component Value Date/Time   COLORURINE YELLOW 11/07/2020 1700   APPEARANCEUR CLOUDY (A) 11/07/2020 1700   LABSPEC 1.017 11/07/2020 1700   PHURINE 5.0 11/07/2020 1700   GLUCOSEU NEGATIVE 11/07/2020 1700   HGBUR LARGE (A) 11/07/2020 1700   BILIRUBINUR NEGATIVE 11/07/2020 1700   KETONESUR 5 (A) 11/07/2020 1700   PROTEINUR 100 (A) 11/07/2020 1700   UROBILINOGEN 0.2 05/26/2015 1521   NITRITE NEGATIVE 11/07/2020 1700   LEUKOCYTESUR MODERATE (A) 11/07/2020 1700   Sepsis Labs: (procalcitonin:4,lacticidven:4) ) Recent Results (from the past 240 hour(s))  Resp Panel by RT-PCR  (Flu A&B, Covid) Nasopharyngeal Swab     Status: None   Collection Time: 11/07/20  2:44 PM   Specimen: Nasopharyngeal Swab; Nasopharyngeal(NP) swabs in vial transport medium  Result Value Ref Range Status   SARS Coronavirus 2 by RT PCR NEGATIVE NEGATIVE Final    Comment: (NOTE) SARS-CoV-2 target nucleic acids are NOT DETECTED.  The SARS-CoV-2 RNA is generally detectable in upper respiratory specimens during the acute phase of infection. The lowest concentration of SARS-CoV-2 viral copies this assay can detect is 138 copies/mL. A negative result does not preclude SARS-Cov-2 infection and should not be used as the sole basis for treatment or other patient management decisions. A negative result  may occur with  improper specimen collection/handling, submission of specimen other than nasopharyngeal swab, presence of viral mutation(s) within the areas targeted by this assay, and inadequate number of viral copies(<138 copies/mL). A negative result must be combined with clinical observations, patient history, and epidemiological information. The expected result is Negative.  Fact Sheet for Patients:  BloggerCourse.com  Fact Sheet for Healthcare Providers:  SeriousBroker.it  This test is no t yet approved or cleared by the [place] FDA and  has been authorized for detection and/or diagnosis of SARS-CoV-2 by FDA under an Emergency Use Authorization (EUA). This EUA will remain  in effect (meaning this test can be used) for the duration of the COVID-19 declaration under Section 564(b)(1) of the Act, 21 U.S.C.section 360bbb-3(b)(1), unless the authorization is terminated  or revoked sooner.       Influenza A by PCR NEGATIVE NEGATIVE Final   Influenza B by PCR NEGATIVE NEGATIVE Final    Comment: (NOTE) The Xpert Xpress SARS-CoV-2/FLU/RSV plus assay is intended as an aid in the diagnosis of influenza from Nasopharyngeal swab specimens  and should not be used as a sole basis for treatment. Nasal washings and aspirates are unacceptable for Xpert Xpress SARS-CoV-2/FLU/RSV testing.  Fact Sheet for Patients: BloggerCourse.com  Fact Sheet for Healthcare Providers: SeriousBroker.it  This test is not yet approved or cleared by the [place] FDA and has been authorized for detection and/or diagnosis of SARS-CoV-2 by FDA under an Emergency Use Authorization (EUA). This EUA will remain in effect (meaning this test can be used) for the duration of the COVID-19 declaration under Section 564(b)(1) of the Act, 21 U.S.C. section 360bbb-3(b)(1), unless the authorization is terminated or revoked.  Performed at [facility], [address], [place], [place] [address]   Culture, blood (Routine X 2) w Reflex to ID Panel     Status: None (Preliminary result)   Collection Time: 11/07/20  3:28 PM   Specimen: BLOOD RIGHT FOREARM  Result Value Ref Range Status   Specimen Description   Final    BLOOD RIGHT FOREARM BOTTLES DRAWN AEROBIC AND ANAEROBIC   Special Requests Blood Culture adequate volume  Final   Culture   Final    NO GROWTH 3 DAYS Performed at [facility], [address] [place], [place] [address]    Report Status PENDING  Incomplete  Culture, blood (Routine X 2) w Reflex to ID Panel     Status: None (Preliminary result)   Collection Time: 11/07/20  3:59 PM   Specimen: Left Antecubital; Blood  Result Value Ref Range Status   Specimen Description LEFT ANTECUBITAL  Final   Special Requests   Final    BOTTLES DRAWN AEROBIC AND ANAEROBIC Blood Culture adequate volume   Culture   Final    NO GROWTH 3 DAYS Performed at [facility], [address] [place], [place] [address]    Report Status PENDING  Incomplete  Urine Culture     Status: Abnormal   Collection Time: 11/07/20  5:00 PM   Specimen: Urine, Clean Catch  Result Value Ref Range Status   Specimen Description    Final    URINE, CLEAN CATCH Performed at [facility], [address] [place], [place] [address]    Special Requests   Final    NONE Performed at [facility], [address], [place], [place] [address]    Culture MULTIPLE SPECIES PRESENT, SUGGEST RECOLLECTION (A)  Final   Report Status 11/09/2020 FINAL  Final  Culture, Urine  Status: Abnormal   Collection Time: 11/08/20  3:51 PM   Specimen: Urine, Clean Catch  Result Value Ref Range Status   Specimen Description   Final    URINE, CLEAN CATCH Performed at [facility], [address] [place], [place] [address]    Special Requests   Final    NONE Performed at [facility], [address] [place], [place] [address]    Culture MULTIPLE SPECIES PRESENT, SUGGEST RECOLLECTION (A)  Final   Report Status 11/10/2020 FINAL  Final  MRSA PCR Screening     Status: Abnormal   Collection Time: 11/08/20  4:00 PM   Specimen: Nasopharyngeal  Result Value Ref Range Status   MRSA by PCR POSITIVE (A) NEGATIVE Final    Comment:        The GeneXpert MRSA Assay (FDA approved for NASAL specimens only), is one component of a comprehensive MRSA colonization surveillance program. It is not intended to diagnose MRSA infection nor to guide or monitor treatment for MRSA infections. RESULT CALLED TO, READ BACK BY AND VERIFIED WITH: K LOOMIS AT 1836 ON 11/08/2020 BY MOSLEY,J Performed at [facility], [address] [place], [place] [address]      Scheduled Meds: . sodium chloride   Intravenous Once  . sodium chloride   Intravenous Once  . apixaban  2.5 mg Oral BID  . Chlorhexidine Gluconate Cloth  6 each Topical Q0600  . dronabinol  5 mg Oral BID AC  . levothyroxine  125 mcg Oral QAC breakfast  . metroNIDAZOLE  500 mg Oral Q8H  . mupirocin ointment  1 application Nasal BID  . pantoprazole  40 mg Intravenous Q12H  . sertraline  25 mg Oral Daily  . sucralfate  1 g Oral TID WC & HS   Continuous Infusions: . sodium chloride 20 mL/hr at  11/11/20 1827  . cefTRIAXone (ROCEPHIN)  IV Stopped (11/11/20 1228)  . dextrose 5 % with KCl 20 mEq / L 100 mL/hr at 11/12/20 0710    Procedures/Studies: CT ABDOMEN PELVIS WO CONTRAST  Result Date: 11/07/2020 CLINICAL DATA:  Melena with weakness and lethargy. EXAM: CT ABDOMEN AND PELVIS WITHOUT CONTRAST TECHNIQUE: Multidetector CT imaging of the abdomen and pelvis was performed following the standard protocol without IV contrast. COMPARISON:  September 26, 2020 FINDINGS: Lower chest: Mild atelectasis and/or infiltrate is seen within the right lung base. There is a moderate sized right pleural effusion. A small left pleural effusion is also noted. Hepatobiliary: No focal liver abnormality is seen. No gallstones, gallbladder wall thickening, or biliary dilatation. Pancreas: Unremarkable. No pancreatic ductal dilatation or surrounding inflammatory changes. Spleen: Normal in size without focal abnormality. Adrenals/Urinary Tract: Adrenal glands are unremarkable. The left kidney is normal in size. Moderate severity diffuse cortical thinning with renal atrophy is seen on the right. A stable 1.6 cm diameter cyst is noted within the posterior aspect of the mid left kidney. A 2.7 mm staghorn calculus is seen on the right with additional innumerable subcentimeter non-obstructing renal stones present throughout the right kidney. A moderate amount of air is seen within the calices of the right kidney and proximal right ureter. This represents a new finding when compared to the prior exam. No perinephric or periureteral inflammatory fat stranding is seen. A mild-to-moderate amount of air is seen within the lumen of a urinary bladder diverticulum. A small amount of air is seen within this region on the prior study. Stomach/Bowel: Stomach is within normal limits. The appendix is not clearly identified. No  evidence of bowel dilatation. Mild to moderate severity thickening of the mid to distal sigmoid colon is seen (axial CT  images 64 through 74, CT series number 2). Vascular/Lymphatic: Aortic atherosclerosis. No enlarged abdominal or pelvic lymph nodes. Reproductive: The prostate gland is poorly visualized secondary to overlying streak artifact. Other: A stable posterolateral right abdominal wall hernia is seen. No abdominopelvic ascites. Musculoskeletal: A chronic eleventh right rib fracture is seen. A left hip replacement is seen with associated streak artifact and subsequently limited evaluation of the adjacent osseous and soft tissue structures. Multilevel degenerative changes seen throughout the lumbar spine. IMPRESSION: 1. Mild to moderate severity colitis involving the mid to distal sigmoid colon. Follow-up to resolution is recommended to exclude the presence of an underlying neoplasm. 2. Moderate amount of calyceal air within the right kidney and proximal right ureter which the secondary to recent instrumentation. The presence of an underlying infectious process cannot be excluded. Correlation with urinalysis is recommended. 3. Bilateral pleural effusions, right greater than left. 4. Atrophic right kidney with extensive right-sided nephrolithiasis. 5. Left hip replacement. 6. Aortic atherosclerosis. Aortic Atherosclerosis (ICD10-I70.0). Electronically Signed   By: Aram Candela M.D.   On: 11/07/2020 17:25   DG Chest 1 View  Result Date: 11/07/2020 CLINICAL DATA:  Altered mental status. EXAM: CHEST  1 VIEW COMPARISON:  September 20, 2020 FINDINGS: A single lead ventricular pacer is in place. There is no evidence of acute infiltrate. A small, stable right pleural effusion is noted. No pneumothorax is seen. The heart size and mediastinal contours are within normal limits. Degenerative changes are present throughout the thoracic spine. IMPRESSION: 1. Small, stable right pleural effusion. Electronically Signed   By: Aram Candela M.D.   On: 11/07/2020 15:34   CT Head Wo Contrast  Result Date: 11/07/2020 CLINICAL  DATA:  Weakness and lethargy. EXAM: CT HEAD WITHOUT CONTRAST TECHNIQUE: Contiguous axial images were obtained from the base of the skull through the vertex without intravenous contrast. COMPARISON:  September 20, 2020 FINDINGS: Brain: There is mild cerebral atrophy with widening of the extra-axial spaces and ventricular dilatation. There are areas of decreased attenuation within the white matter tracts of the supratentorial brain, consistent with microvascular disease changes. Vascular: No hyperdense vessel or unexpected calcification. Skull: Normal. Negative for fracture or focal lesion. Sinuses/Orbits: No acute finding. Other: None. IMPRESSION: 1. Generalized cerebral atrophy. 2. No acute intracranial abnormality. Electronically Signed   By: Aram Candela M.D.   On: 11/07/2020 15:59    Catarina Hartshorn, DO  [facility]  If 7PM-7AM, please contact night-coverage [email or web address] Password [email or web address] 11/12/2020, 9:22 AM   LOS: 5 days

## 2020-11-12 NOTE — TOC Progression Note (Signed)
Transition of Care [facility]) - Progression Note    Patient Details  Name: Ryan Macdonald MRN: [id number] Date of Birth: [birth year]  Transition of [facility] CM/SW Contact  Karn [place], [place] Phone Number: 11/12/2020, 3:33 PM  Clinical Narrative:  LCSW spoke with pt's wife regarding d/c plan. She states she feels pt was not receiving good care at [facility] and would like to look into other options, preferably in [place] as this is where they live. She is aware pt is in copay days. Pt's wife said Medicaid application is pending. SNF search initiated in [place]. Discussed plan of returning to [facility] vs going home with home health if no bed offers are made. Pt's wife states pt will return to [facility] if no other options available. TOC will continue to follow.      Expected Discharge Plan: Skilled Nursing Facility Barriers to Discharge: Continued Medical Work up  Expected Discharge Plan and Services Expected Discharge Plan: Skilled Nursing Facility In-house Referral: Clinical Social Work     Living arrangements for the past 2 months: Skilled Nursing Facility                                       Social Determinants of Health (SDOH) Interventions    Readmission Risk Interventions Readmission Risk Prevention Plan 11/10/2020 09/25/2020  Transportation Screening Complete Complete  HRI or Home Care Consult - Complete  Social Work Consult for Recovery Care Planning/Counseling - Complete  Palliative Care Screening - Not Applicable  Medication Review Oceanographer) Complete Complete  HRI or Home Care Consult Complete -  SW Recovery Care/Counseling Consult Complete -  Palliative Care Screening Complete -  Skilled Nursing Facility Complete -  Some recent data might be hidden

## 2020-11-12 NOTE — Evaluation (Signed)
Physical Therapy Evaluation Patient Details Name: Ryan Macdonald MRN: [id number] DOB: 1934-04-20 Today's Date: 11/12/2020   History of Present Illness  Ryan Macdonald  is a 86 y.o. male,  male with medical history significant of HFrEF (EF 20 to 25%, AICD in place), paroxysmal A. fib on amiodarone and Eliquis, hypertension, chronic kidney disease 3B, hyperlipidemia on a statin, gout, hypothyroidism, and recent sepsis related to a UTI patient with multiple recent hospitalizations, she is currently altered, cannot provide any history, it was obtained from ED staff and wife by phone.  -Patient was sent by [facility] for increased weakness, lethargy, and decreased oral intake, in ED patient was noted to be unresponsive at all, medicated provide any complaints, patient was noted to to have melena while in ED, Hemoccult came back positive, as well wife mentioned patient with poor oral intake recently, and there was discussion with facility staff about arranging for peg tube during his poor oral intake.  -in ED evaluation was significant for melena, Hemoccult positive stool, with hemoglobin of 6.8, as well of sodium of 157, worsening creatinine and BUN, creatinine 3.76, BUN of 92, BNP elevated at 306, mildly elevated AST at 74, CT abdomen and pelvis was significant for colitis, as well there is some evidence of right pleural effusion, and UA was strongly positive, as well patient was noted to have soft blood pressure, [facility] hospitalist consulted to admit.    Clinical Impression  Patient functioning at baseline for functional mobility with is total assist for transfers and non-ambulatory.  Patient discharged from physical therapy to care of nursing for rolling side to side every 2 hours and ROM to extremities as tolerated for length of stay.    Follow Up Recommendations No PT follow up;SNF;Supervision for mobility/OOB;Supervision - Intermittent    Equipment Recommendations  None recommended by PT     Recommendations for Other Services       Precautions / Restrictions Precautions Precautions: Fall Restrictions Weight Bearing Restrictions: No      Mobility  Bed Mobility Overal bed mobility: Needs Assistance Bed Mobility: Sit to Supine       Sit to supine: Total assist   General bed mobility comments: limited to partial long sitting, unable to flex trunk due to stiffness    Transfers                    Ambulation/Gait                Stairs            Wheelchair Mobility    Modified Rankin (Stroke Patients Only)       Balance Overall balance assessment: Needs assistance Sitting-balance support: Feet unsupported;No upper extremity supported Sitting balance-Leahy Scale: Poor Sitting balance - Comments: limited to partial sitting with Total assist                                     Pertinent Vitals/Pain Pain Assessment: No/denies pain    Home Living Family/patient expects to be discharged to:: Skilled nursing facility                      Prior Function Level of Independence: Needs assistance   Gait / Transfers Assistance Needed: 3 person assisted transfers or uses lift  ADL's / Homemaking Assistance Needed: assisted by SNF staff        Hand Dominance  Dominant Hand: Right    Extremity/Trunk Assessment   Upper Extremity Assessment Upper Extremity Assessment: Generalized weakness    Lower Extremity Assessment Lower Extremity Assessment: Generalized weakness;RLE deficits/detail RLE Deficits / Details: grossly -2/5, unable to move against gravity, c/o severe pain with knee flexion RLE: Unable to fully assess due to immobilization;Unable to fully assess due to pain RLE Sensation: WNL RLE Coordination: WNL    Cervical / Trunk Assessment Cervical / Trunk Assessment: Kyphotic  Communication   Communication: Expressive difficulties;Other (comment) (hard to understand)  Cognition Arousal/Alertness:  Awake/alert Behavior During Therapy: WFL for tasks assessed/performed Overall Cognitive Status: Within Functional Limits for tasks assessed                                        General Comments      Exercises     Assessment/Plan    PT Assessment Patent does not need any further PT services  PT Problem List         PT Treatment Interventions      PT Goals (Current goals can be found in the Care Plan section)  Acute Rehab PT Goals Patient Stated Goal: return to SNF LTC PT Goal Formulation: With patient Time For Goal Achievement: 11/12/20 Potential to Achieve Goals: Good    Frequency     Barriers to discharge        Co-evaluation               AM-PAC PT "6 Clicks" Mobility  Outcome Measure Help needed turning from your back to your side while in a flat bed without using bedrails?: Total Help needed moving from lying on your back to sitting on the side of a flat bed without using bedrails?: Total Help needed moving to and from a bed to a chair (including a wheelchair)?: Total Help needed standing up from a chair using your arms (e.g., wheelchair or bedside chair)?: Total Help needed to walk in hospital room?: Total Help needed climbing 3-5 steps with a railing? : Total 6 Click Score: 6    End of Session   Activity Tolerance: Patient limited by fatigue;Patient limited by pain Patient left: in bed;with call bell/phone within reach Nurse Communication: Mobility status PT Visit Diagnosis: Unsteadiness on feet (R26.81);Other abnormalities of gait and mobility (R26.89);Muscle weakness (generalized) (M62.81)    Time: [id number] PT Time Calculation (min) (ACUTE ONLY): 17 min   Charges:   PT Evaluation $PT Eval Low Complexity: 1 Low PT Treatments $Therapeutic Activity: 8-22 mins        2:37 PM, 11/12/20 Ocie Bob, MPT Physical Therapist with [facility] [phone] office [phone] mobile phone

## 2020-11-12 NOTE — Evaluation (Signed)
Clinical/Bedside Swallow Evaluation Patient Details  Name: Ryan Macdonald MRN: [id number] Date of Birth: 03-29-1934  Today's Date: 11/12/2020 Time: SLP Start Time (ACUTE ONLY): 1300 SLP Stop Time (ACUTE ONLY): 1328 SLP Time Calculation (min) (ACUTE ONLY): 28 min  Past Medical History:  Past Medical History:  Diagnosis Date  . Acute bronchitis 05/10/2017  . Acute respiratory failure (HCC)   . Acute respiratory failure with hypoxia (HCC) 09/27/2017  . AKI (acute kidney injury) (HCC) 02/2019  . AKI (acute kidney injury) (HCC) 03/14/2019  . Atrial fibrillation (HCC)   . CAP (community acquired pneumonia) 02/03/2017  . Cardiomyopathy (HCC)   . Cat scratch fever    removed mass on right side neck  . Cataracts, bilateral   . CHF (congestive heart failure) (HCC)   . CHF exacerbation (HCC) 09/27/2017  . Chronic kidney disease   . Corns and callosities   . Diabetes mellitus without complication (HCC)    TYPE 2  . Dyspnea   . Erythema intertrigo   . Flat foot   . Gout   . High cholesterol   . Hx of urethral stricture   . Hypertension   . Kidney stones   . Morbid obesity (HCC)   . Nonischemic cardiomyopathy (HCC)    a. ? 2009 Cath in [place] - nl cors per pt;  b. 09/2013 Echo: EF 25-30%, sev diff HK.  . Obesity   . Osteoarthritis   . PAF (paroxysmal atrial fibrillation) (HCC)    a. post-op hip in 2014 - prev on xarelto.  . Renal insufficiency    Past Surgical History:  Past Surgical History:  Procedure Laterality Date  . CARDIAC CATHETERIZATION  1980's  . CARDIOVERSION N/A 01/15/2020   Procedure: CARDIOVERSION;  Surgeon: Laurey Morale, MD;  Location: [facility] ENDOSCOPY;  Service: Cardiovascular;  Laterality: N/A;  . CIRCUMCISION  1940's  . CORONARY STENT INTERVENTION N/A 08/18/2018   Procedure: CORONARY STENT INTERVENTION;  Surgeon: Corky Crafts, MD;  Location: [facility] INVASIVE CV LAB;  Service: Cardiovascular;  Laterality: N/A;  . CYSTOSCOPY WITH URETHRAL DILATATION  10/23/2013   . CYSTOSCOPY WITH URETHRAL DILATATION N/A 10/23/2013   Procedure: CYSTOSCOPY WITH URETHRAL DILATATION;  Surgeon: Kathi Ludwig, MD;  Location: [facility] OR;  Service: Urology;  Laterality: N/A;  . INCISION AND DRAINAGE OF WOUND Right 1980's   "cat scratch" (10/23/2013)  . INGUINAL HERNIA REPAIR Right 1950's  . INTRAVASCULAR ULTRASOUND/IVUS N/A 08/18/2018   Procedure: Intravascular Ultrasound/IVUS;  Surgeon: Corky Crafts, MD;  Location: [facility] INVASIVE CV LAB;  Service: Cardiovascular;  Laterality: N/A;  . KIDNEY STONE SURGERY Right 1970's; 1983   "twice"  . LEFT HEART CATH AND CORONARY ANGIOGRAPHY N/A 08/18/2018   Procedure: LEFT HEART CATH AND CORONARY ANGIOGRAPHY;  Surgeon: Corky Crafts, MD;  Location: [facility] INVASIVE CV LAB;  Service: Cardiovascular;  Laterality: N/A;  . RIGHT HEART CATH N/A 02/19/2020   Procedure: RIGHT HEART CATH;  Surgeon: Laurey Morale, MD;  Location: [facility] INVASIVE CV LAB;  Service: Cardiovascular;  Laterality: N/A;  . TEE WITHOUT CARDIOVERSION N/A 01/15/2020   Procedure: TRANSESOPHAGEAL ECHOCARDIOGRAM (TEE);  Surgeon: Laurey Morale, MD;  Location: [facility] ENDOSCOPY;  Service: Cardiovascular;  Laterality: N/A;  . TONSILLECTOMY AND ADENOIDECTOMY  1940's  . TOTAL HIP ARTHROPLASTY Left 10/23/2013   Procedure: TOTAL HIP ARTHROPLASTY;  Surgeon: Valeria Batman, MD;  Location: [facility] OR;  Service: Orthopedics;  Laterality: Left;   HPI:  86 y.o. male  with past medical history of CHF with EF of  20 to 25%, A. fib, and stage III chronic kidney disease,  admitted on 11/07/2020 with increasing lethargy and decreased oral intake.  Per his spouse he was sent here in order to be transferred to [facility] for a feeding tube.  However work-up revealed anemia with a hemoglobin of 6.8, acute kidney injury, UTI.  GI consulted and an EGD was conducted showing a cirrhosis of esophagitis and gastritis.  Being treated with IV fluid resuscitation and antibiotics.  Showing great improvement in  his mental status and oral intake. BSE requested.   Assessment / Plan / Recommendation Clinical Impression  Clinical swallow evaluation completed at bedside. Pt's breakfast and lunch tray in room and appear untouched. Nursing staff report poor po intake. Pt with black coating on tongue and oral care completed (much of it removed with a toothbrush). Pt with soft vocal quality, weak cough, delayed volitional swallow. Pt agreeable to ice chips and showed no overt signs or symptoms of aspiration. Further assessment of swallow with trials of thin, NTL, and puree. Pt with prolonged oral prep, oral holding, and suspected delay in swallow initiation, but no overt signs of reduced airway protection. Pt appreciative of thin orange juice, otherwise he required max encouragement for po trials of puree and NTL. Pt reports poor appetite, but does appear to want thin liquids. Discussed with MD. Will advance to thin liquids and continue puree in the hopes that oral intake of at least liquids improves. Could complete MBSS, however suspect more appropriate for Pt to have thin liquids given no overt signs of aspiration and extremely poor intake of thickened liquids. Pt had BSE at [facility] in November with recommendation for thin liquids, however he was admitted from the [facility] to [facility] on NTL. Will contact SLP at [facility] to see if Pt had an objective swallow assessment over there. Recommend D1/puree and thin liquids with aspiration and reflux precautions, po medications whole in puree. SLP will follow.   SLP Visit Diagnosis: Dysphagia, unspecified (R13.10)    Aspiration Risk  Mild aspiration risk    Diet Recommendation Dysphagia 1 (Puree);Thin liquid   Liquid Administration via: Cup;Straw Medication Administration: Crushed with puree Supervision: Staff to assist with self feeding;Full supervision/cueing for compensatory strategies Compensations: Slow rate;Small sips/bites;Follow solids with liquid Postural  Changes: Seated upright at 90 degrees;Remain upright for at least 30 minutes after po intake    Other  Recommendations Oral Care Recommendations: Oral care BID;Staff/trained caregiver to provide oral care Other Recommendations: Clarify dietary restrictions   Follow up Recommendations 24 hour supervision/assistance      Frequency and Duration min 2x/week  1 week       Prognosis Prognosis for Safe Diet Advancement: Guarded Barriers to Reach Goals: Severity of deficits      Swallow Study   General Date of Onset: 11/07/20 HPI: 86 y.o. male  with past medical history of CHF with EF of 20 to 25%, A. fib, and stage III chronic kidney disease,  admitted on 11/07/2020 with increasing lethargy and decreased oral intake.  Per his spouse he was sent here in order to be transferred to [facility] for a feeding tube.  However work-up revealed anemia with a hemoglobin of 6.8, acute kidney injury, UTI.  GI consulted and an EGD was conducted showing a cirrhosis of esophagitis and gastritis.  Being treated with IV fluid resuscitation and antibiotics.  Showing great improvement in his mental status and oral intake. BSE requested. Type of Study: Bedside Swallow Evaluation Previous Swallow Assessment:  BSE at [facility] in November 2021 D3/thin Diet Prior to this Study: Dysphagia 1 (puree);Nectar-thick liquids Temperature Spikes Noted: No Respiratory Status: Nasal cannula History of Recent Intubation: No Behavior/Cognition: Alert;Cooperative;Pleasant mood Oral Cavity Assessment: Dried secretions Oral Care Completed by SLP: Yes Oral Cavity - Dentition: Edentulous Vision: Impaired for self-feeding Self-Feeding Abilities: Total assist Patient Positioning: Upright in bed Baseline Vocal Quality: Breathy Volitional Cough: Weak Volitional Swallow: Able to elicit    Oral/Motor/Sensory Function Overall Oral Motor/Sensory Function: Generalized oral weakness   Ice Chips Ice chips: Within functional  limits Presentation: Spoon   Thin Liquid Thin Liquid: Impaired Presentation: Cup;Straw Oral Phase Impairments: Reduced labial seal Pharyngeal  Phase Impairments: Suspected delayed Swallow    Nectar Thick Nectar Thick Liquid: Impaired Presentation: Cup;Straw Oral Phase Impairments: Reduced labial seal Pharyngeal Phase Impairments: Suspected delayed Swallow   Honey Thick Honey Thick Liquid: Not tested   Puree Puree: Impaired Presentation: Spoon Oral Phase Impairments: Reduced lingual movement/coordination Oral Phase Functional Implications: Oral holding   Solid     Solid: Not tested     Thank you,  Ryan Macdonald, CCC-SLP [phone]  Ryan Macdonald 11/12/2020,1:30 PM

## 2020-11-13 ENCOUNTER — Encounter (HOSPITAL_COMMUNITY): Payer: No Typology Code available for payment source

## 2020-11-13 DIAGNOSIS — K2970 Gastritis, unspecified, without bleeding: Secondary | ICD-10-CM

## 2020-11-13 DIAGNOSIS — B9681 Helicobacter pylori [H. pylori] as the cause of diseases classified elsewhere: Secondary | ICD-10-CM

## 2020-11-13 DIAGNOSIS — D696 Thrombocytopenia, unspecified: Secondary | ICD-10-CM

## 2020-11-13 DIAGNOSIS — K921 Melena: Secondary | ICD-10-CM

## 2020-11-13 LAB — COMPREHENSIVE METABOLIC PANEL
ALT: 23 U/L (ref 0–44)
AST: 48 U/L — ABNORMAL HIGH (ref 15–41)
Albumin: 2.2 g/dL — ABNORMAL LOW (ref 3.5–5.0)
Alkaline Phosphatase: 61 U/L (ref 38–126)
Anion gap: 5 (ref 5–15)
BUN: 32 mg/dL — ABNORMAL HIGH (ref 8–23)
CO2: 19 mmol/L — ABNORMAL LOW (ref 22–32)
Calcium: 7.9 mg/dL — ABNORMAL LOW (ref 8.9–10.3)
Chloride: 116 mmol/L — ABNORMAL HIGH (ref 98–111)
Creatinine, Ser: 1.56 mg/dL — ABNORMAL HIGH (ref 0.61–1.24)
GFR, Estimated: 44 mL/min — ABNORMAL LOW (ref >60)
Glucose, Bld: 90 mg/dL (ref 70–99)
Potassium: 4.1 mmol/L (ref 3.5–5.1)
Sodium: 140 mmol/L (ref 135–145)
Total Bilirubin: 1.4 mg/dL — ABNORMAL HIGH (ref 0.3–1.2)
Total Protein: 6 g/dL — ABNORMAL LOW (ref 6.5–8.1)

## 2020-11-13 LAB — URINALYSIS, COMPLETE (UACMP) WITH MICROSCOPIC
RBC / HPF: >50 RBC/hpf — ABNORMAL HIGH (ref 0–5)
WBC, UA: >50 WBC/hpf — ABNORMAL HIGH (ref 0–5)

## 2020-11-13 LAB — CBC
HCT: 30 % — ABNORMAL LOW (ref 39.0–52.0)
Hemoglobin: 9.4 g/dL — ABNORMAL LOW (ref 13.0–17.0)
MCH: 32.1 pg (ref 26.0–34.0)
MCHC: 31.3 g/dL (ref 30.0–36.0)
MCV: 102.4 fL — ABNORMAL HIGH (ref 80.0–100.0)
Platelets: 135 10*3/uL — ABNORMAL LOW (ref 150–400)
RBC: 2.93 MIL/uL — ABNORMAL LOW (ref 4.22–5.81)
RDW: 21 % — ABNORMAL HIGH (ref 11.5–15.5)
WBC: 7.2 10*3/uL (ref 4.0–10.5)
nRBC: 0 % (ref 0.0–0.2)

## 2020-11-13 LAB — URINALYSIS, ROUTINE W REFLEX MICROSCOPIC
Bacteria, UA: NONE SEEN
Bilirubin Urine: NEGATIVE
Glucose, UA: 50 mg/dL — AB
Ketones, ur: NEGATIVE mg/dL
Nitrite: NEGATIVE
Protein, ur: 100 mg/dL — AB
RBC / HPF: >50 RBC/hpf — ABNORMAL HIGH (ref 0–5)
Specific Gravity, Urine: 1.016 (ref 1.005–1.030)
WBC, UA: >50 WBC/hpf — ABNORMAL HIGH (ref 0–5)
pH: 6 (ref 5.0–8.0)

## 2020-11-13 LAB — CULTURE, BLOOD (ROUTINE X 2)
Culture: NO GROWTH
Culture: NO GROWTH
Special Requests: ADEQUATE
Special Requests: ADEQUATE

## 2020-11-13 LAB — MAGNESIUM: Magnesium: 1.8 mg/dL (ref 1.7–2.4)

## 2020-11-13 MED ORDER — PANTOPRAZOLE SODIUM 40 MG PO TBEC
40.0000 mg | DELAYED_RELEASE_TABLET | Freq: Two times a day (BID) | ORAL | Status: DC
  Administered 2020-11-14 – 2020-11-15 (×3): 40 mg via ORAL
  Filled 2020-11-13 (×4): qty 1

## 2020-11-13 NOTE — Progress Notes (Addendum)
Subjective:  Patient resting comfortably. States he does not feel hungry or want to eat. Denies abdominal pain.   Objective: Vital signs in last 24 hours: Temp:  [96.7 F (35.9 C)-98.1 F (36.7 C)] 98.1 F (36.7 C) (12/16 0350) Pulse Rate:  [64-80] 80 (12/16 0350) Resp:  [16-23] 19 (12/16 0350) BP: (83-112)/(44-71) 110/63 (12/16 0350) SpO2:  [98 %-100 %] 100 % (12/16 0350) Last BM Date: 11/12/20 General:   Alert,  Little more conversant this morning. NAD Head:  Normocephalic and atraumatic. Eyes:  Sclera clear, no icterus.  Abdomen:  Soft, nontender and nondistended. Normal bowel sounds, without guarding, and without rebound.   Extremities:  Without clubbing, deformity or edema. Neurologic:  Alert and  Oriented to self.   Intake/Output from previous day: 12/15 0701 - 12/16 0700 In: 1214 [I.V.:1114; IV Piggyback:100] Out: 600 [Urine:600] Intake/Output this shift: No intake/output data recorded.  Lab Results: CBC Recent Labs    11/11/20 0404 11/12/20 0507 11/13/20 0537  WBC 6.7 7.4 7.2  HGB 9.8* 9.1* 9.4*  HCT 31.5* 28.5* 30.0*  MCV 102.9* 102.2* 102.4*  PLT 121* 121* 135*   BMET Recent Labs    11/11/20 0404 11/12/20 0507 11/13/20 0537  NA 151* 145 140  K 3.5 3.7 4.1  CL 123* 119* 116*  CO2 21* 20* 19*  GLUCOSE 128* 111* 90  BUN 57* 40* 32*  CREATININE 2.14* 1.71* 1.56*  CALCIUM 7.9* 7.6* 7.9*   LFTs Recent Labs    11/11/20 0404 11/12/20 0507 11/13/20 0537  BILITOT 2.0*  2.1* 1.4* 1.4*  BILIDIR 0.5*  --   --   IBILI 1.5*  --   --   ALKPHOS 61 59 61  AST 59* 51* 48*  ALT 26 23 23   PROT 6.1* 5.8* 6.0*  ALBUMIN 2.5* 2.2* 2.2*   No results for input(s): LIPASE in the last 72 hours. PT/INR No results for input(s): LABPROT, INR in the last 72 hours.    Imaging Studies: CT ABDOMEN PELVIS WO CONTRAST  Result Date: 11/07/2020 CLINICAL DATA:  Melena with weakness and lethargy. EXAM: CT ABDOMEN AND PELVIS WITHOUT CONTRAST TECHNIQUE: Multidetector CT  imaging of the abdomen and pelvis was performed following the standard protocol without IV contrast. COMPARISON:  September 26, 2020 FINDINGS: Lower chest: Mild atelectasis and/or infiltrate is seen within the right lung base. There is a moderate sized right pleural effusion. A small left pleural effusion is also noted. Hepatobiliary: No focal liver abnormality is seen. No gallstones, gallbladder wall thickening, or biliary dilatation. Pancreas: Unremarkable. No pancreatic ductal dilatation or surrounding inflammatory changes. Spleen: Normal in size without focal abnormality. Adrenals/Urinary Tract: Adrenal glands are unremarkable. The left kidney is normal in size. Moderate severity diffuse cortical thinning with renal atrophy is seen on the right. A stable 1.6 cm diameter cyst is noted within the posterior aspect of the mid left kidney. A 2.7 mm staghorn calculus is seen on the right with additional innumerable subcentimeter non-obstructing renal stones present throughout the right kidney. A moderate amount of air is seen within the calices of the right kidney and proximal right ureter. This represents a new finding when compared to the prior exam. No perinephric or periureteral inflammatory fat stranding is seen. A mild-to-moderate amount of air is seen within the lumen of a urinary bladder diverticulum. A small amount of air is seen within this region on the prior study. Stomach/Bowel: Stomach is within normal limits. The appendix is not clearly identified. No evidence of bowel dilatation. Mild  to moderate severity thickening of the mid to distal sigmoid colon is seen (axial CT images 64 through 74, CT series number 2). Vascular/Lymphatic: Aortic atherosclerosis. No enlarged abdominal or pelvic lymph nodes. Reproductive: The prostate gland is poorly visualized secondary to overlying streak artifact. Other: A stable posterolateral right abdominal wall hernia is seen. No abdominopelvic ascites. Musculoskeletal: A  chronic eleventh right rib fracture is seen. A left hip replacement is seen with associated streak artifact and subsequently limited evaluation of the adjacent osseous and soft tissue structures. Multilevel degenerative changes seen throughout the lumbar spine. IMPRESSION: 1. Mild to moderate severity colitis involving the mid to distal sigmoid colon. Follow-up to resolution is recommended to exclude the presence of an underlying neoplasm. 2. Moderate amount of calyceal air within the right kidney and proximal right ureter which the secondary to recent instrumentation. The presence of an underlying infectious process cannot be excluded. Correlation with urinalysis is recommended. 3. Bilateral pleural effusions, right greater than left. 4. Atrophic right kidney with extensive right-sided nephrolithiasis. 5. Left hip replacement. 6. Aortic atherosclerosis. Aortic Atherosclerosis (ICD10-I70.0). Electronically Signed   By: Aram Candela M.D.   On: 11/07/2020 17:25   DG Chest 1 View  Result Date: 11/07/2020 CLINICAL DATA:  Altered mental status. EXAM: CHEST  1 VIEW COMPARISON:  September 20, 2020 FINDINGS: A single lead ventricular pacer is in place. There is no evidence of acute infiltrate. A small, stable right pleural effusion is noted. No pneumothorax is seen. The heart size and mediastinal contours are within normal limits. Degenerative changes are present throughout the thoracic spine. IMPRESSION: 1. Small, stable right pleural effusion. Electronically Signed   By: Aram Candela M.D.   On: 11/07/2020 15:34   CT Head Wo Contrast  Result Date: 11/07/2020 CLINICAL DATA:  Weakness and lethargy. EXAM: CT HEAD WITHOUT CONTRAST TECHNIQUE: Contiguous axial images were obtained from the base of the skull through the vertex without intravenous contrast. COMPARISON:  September 20, 2020 FINDINGS: Brain: There is mild cerebral atrophy with widening of the extra-axial spaces and ventricular dilatation. There are  areas of decreased attenuation within the white matter tracts of the supratentorial brain, consistent with microvascular disease changes. Vascular: No hyperdense vessel or unexpected calcification. Skull: Normal. Negative for fracture or focal lesion. Sinuses/Orbits: No acute finding. Other: None. IMPRESSION: 1. Generalized cerebral atrophy. 2. No acute intracranial abnormality. Electronically Signed   By: Aram Candela M.D.   On: 11/07/2020 15:59  [2 weeks]   Assessment: 86 y/o with multiple comorbidities including A. fib on Eliquis, CKD, chronic heart failure with AICD and EF of 20 to 25% (September 2021) admitted with sepsis in the setting of UTI, found to have melena on presentation and acute blood loss anemia.  Acute blood loss anemia: Hemoglobin a few months ago in the 8/9 range, presented with hemoglobin of 6.8 this admission in the setting of Eliquis.  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  Severe gastritis without bleeding, normal examined duodenum. Exam ended abruptly due to patient developing bradycardia with arrhythmia (per endoscopist Dr. Marletta Lor). No specimens collected. Patient has completed PPI infusion.  Currently on IV Protonix 40 mg twice daily and Carafate 4 times daily.  Has received a total of 3 units of packed red blood cells this admission.  Today's hemoglobin 9.4, stable. Eliquis resumed evening of 11/11/20. H.pylori serologies positive and will need  to be treated at time of discharge.   Poor oral intake: seen by speech therapy yesterday. Appreciate their input. Patient reports poor appetite. Likely multifactorial. Given appearance of esophagus, would like do best on soft diet. Hopefully with management of his esophagitis and H.pylori gastritis will will see some improvement.   Colitis on CT: Involving mid to distal  sigmoid colon with association of significant stool burden, felt to be nonspecific finding.  No diarrhea on admission.  He has had a couple of loose stools which may be secondary to antibiotic use.  Continue to monitor.  If significant diarrhea, consider stool studies.   Elevated LFTs: Continue to improve.  Elevated more significantly back in [place] of this year.  However mildly elevated AST noted in April 2021 along with mildly elevated AST and total bilirubin in September 2021.  Yesterday total bilirubin 2.0, indirect bilirubin 1.5.  Prior evaluation for acute hepatitis panel negative.  Ferritin within normal limits.  No cirrhosis on prior imaging (ultrasound and CT).  CT this admission with normal-appearing pancreas, liver, biliary tree, gallbladder.  No abdominal pain or vomiting. Continue to monitor.  Thrombocytopenia: Initially normal platelet count of 266,000 on admission.  Down to 121,000 yesterday but up to 135,000 today.  Continue to monitor.   Plan: 1. Treat H.pylori at time of discharge with pantoprazole 40mg  BID, amoxicillin 1000mg  BID, clarithromycin 500mg  BID all for 14 days. HOLD crestor while on treatment. 2. Continue pantoprazole 40mg  BID during/after H.pylori treatment. 3. Complete 10 day course of Carafate.  4. Discussed with Dr. who completed EGD. Patient had significant bradycardia with cardiac arrhythmia prompting emergent completion of EGD. Due to multiple comorbidities and events occurring during anesthesia for EGD, he would not advise further endoscopic evaluation at this time.   5. Would hold off on colonoscopy as outpatient as patient not a great candidate, no significant lower gi symptoms.  6. As per speech recommendations, patient needs complete assistance with meals.   . [facility] [phone] 12/16/20218:24 AM  Attending note: Wife assisting with a pureed diet when I visited.  Upper dentures at  rehab unit; lowers at home per wife.  Pr refused to wear because he "didn't like food" at rehab.  GIB seems to have tapered off (I see gross blood in Foley bag, however).  Agree with no further attempts at endoscopic evaluation at this time.  Focus on aggressive management of GERD with BID Protonix and carafate.  Offer dietary preferences -pt states he likes chocalate ensure(discussed with nursing staff)  Wife asked to retrieve dentures to try them again  Presumed HP infection - attempt treatment when  Oral intake improves.            bag0) LOS: 6 days

## 2020-11-13 NOTE — Progress Notes (Addendum)
PROGRESS NOTE  Ryan Macdonald YNW:[id number] DOB: 11/07/1934 DOA: 11/07/2020 PCP: Sherol Dade, DO  Brief History:  86 y.o.male,malewith medical history significant ofHFrEF (EF 20 to 25%, AICD in place), paroxysmal A. fib on amiodarone and Eliquis, hypertension, chronic kidney disease 3B, hyperlipidemia on a statin, gout, hypothyroidism, and recent sepsis related to a UTIpatient with multiple recent hospitalizations;  He was altered, could not provide any history, it was obtained from ED staff and wife by phone. -Patient was sent by [facility] for increased weakness, lethargy, and decreased oral intake, in ED patient was noted to be unresponsive at all, medicated provide any complaints, patient was noted to to have melena while in ED, Hemoccult came back positive, as well wife mentioned patient with poor oral intake recently, and there was discussion with facility staff about arranging for peg tube during his poor oral intake. -in EDevaluation was significant for melena, Hemoccult positive stool, with hemoglobin of 6.8, as well of sodium of 157, worsening creatinine and BUN, creatinine 3.76, BUN of 92, BNP elevated at 306, mildly elevated AST at 74, CT abdomen and pelvis was significant for colitis, as well there is some evidence of right pleural effusion, and UA was strongly positive, as well patient was noted to have soft blood pressure, [facility] consulted to admit.  Assessment/Plan: Acute blood loss anemia -Secondary to GI bleeding -Patient noted to have melena, FOBT positive -Hemoglobin 6.8 on admission -He has received a total of 3 units of PRBCs thus far with follow-up hemoglobinhas been stable -Continue to follow hemoglobin and transfuse as needed -11/10/20 EGD LA grade C erosive esophagitis without bleedingsevere gastritis without bleeding, normal examined duodenum;  -PPI infusion completed yesterday -continue on PPI twice daily as well as  Carafate  Acute kidney injury on chronic kidney disease stage IIIb -Likely related to volume depletion and hemodynamic changes -No hydronephrosis reported on imaging -Continue IV fluids, monitor urine output -baseline creatinine 1.4-1.7  Hypotension -Suspect this is related to hypovolemia secondary to severe dehydration as well as GI bleeding -Continue to hold antihypertensives -Continue volume resuscitation -He also received albumin infusions since he is severely hypoalbuminemic and would likely third space IV fluids  Lactic acidosis -present on admission -lactic acid peaked 3.9 -Related to dehydration and hypovolemia -Continue IV fluid resuscitation  Paroxysmal atrial fibrillation -Restart amiodarone -Restart Eliquis per GI  Hypernatremia -Related to severe dehydration and free water deficit -Continue hypotonic fluids  Urinary tract infection/Hematuria -Urinalysis indicates possible infection -Based on sepsis criteria on admission, sepsis has been ruled out -He is on intravenous antibiotics -Urine culture shows multiple species -11/12/20 eve--developed hematuria again--suspect this is related to his staghorn calculus -12/16--repeat UA and urine culture -stop apixaban temporarily -am CBC -discussed with urology, Dr. Loni Beckwith will eval pt.  Pt not operative candidate  colitis -Noted on CT abdomen -Continued on ceftriaxone and Flagyl -GI following  Acute metabolic encephalopathy -Suspect this is related to severe dehydration, hypernatremia -Overall mental status appears to be slowly improving. He does wake up to voice and does engage in some conversation  Chronic systolic congestive heart failure/nonischemic cardiomyopathy -Ejection fraction of 20 to 25%, AICD in place -Holding diuretics in the setting of hypotension/renal failure -Continue to monitor volume status  Severe protein calorie malnutrition -P.o. intake has been poor -Nutrition  consult -It is reported that patient was taking nectar thick liquids at the nursing facility -He will be started on a pured diet and nectar thick liquids  until he can be seen by speech therapy -request repeat speech eval--hopefully to liberalize diet  Hypothyroidism -Continue on Synthroid  Hypokalemia -Replace -check mag  Goals of care -Discussed advanced directives with patient's wife Wyatt Thorstenson again on 12/16 -She has discussed CODE STATUS with patient's children and all are in agreement for DNR status -Patient seen by palliative care to address further goals of care -Decision made against feeding tube -We will need to monitor p.o. intake, if he does not have significant intake,then hemay be more appropriate for hospice       Status is: Inpatient  Remains inpatient appropriate because:IV treatments appropriate due to intensity of illness or inability to take PO   Dispo: The patient is from: SNF  Anticipated d/c is to: SNF  Anticipated d/c date is: 2 day  Patient currently is not medically stable to d/c.    Total time spent 35 minutes.  Greater than 50% spent face to face counseling and coordinating care.     Family Communication:  no Family at bedside  Consultants:  Palliative, GI  Code Status:  DNR  DVT Prophylaxis:  apixaban   Procedures: As Listed in Progress Note Above  Antibiotics: Ceftriaxone 12/14>> Metronidazole 12/11>> Cefepime 12/10>>12/14  RN Pressure Injury Documentation: Pressure Injury 11/08/20 Buttocks Left Stage 2 -  Partial thickness loss of dermis presenting as a shallow open injury with a red, pink wound bed without slough. (Active)  11/08/20 1400  Location: Buttocks  Location Orientation: Left  Staging: Stage 2 -  Partial thickness loss of dermis presenting as a shallow open injury with a red, pink wound bed without slough.  Wound Description (Comments):    Present on Admission: Yes     Pressure Injury 11/08/20 Sacrum Left Stage 2 -  Partial thickness loss of dermis presenting as a shallow open injury with a red, pink wound bed without slough. (Active)  11/08/20 1400  Location: Sacrum  Location Orientation: Left  Staging: Stage 2 -  Partial thickness loss of dermis presenting as a shallow open injury with a red, pink wound bed without slough.  Wound Description (Comments):   Present on Admission: Yes     Pressure Injury 11/11/20 Groin Right Stage 2 -  Partial thickness loss of dermis presenting as a shallow open injury with a red, pink wound bed without slough. (Active)  11/11/20 0900  Location: Groin  Location Orientation: Right  Staging: Stage 2 -  Partial thickness loss of dermis presenting as a shallow open injury with a red, pink wound bed without slough.  Wound Description (Comments):   Present on Admission:         Subjective: Patient denies fevers, chills, headache, chest pain, dyspnea, nausea, vomiting, diarrhea, abdominal pain, dysuria, hematuria, hematochezia, and melena.   Objective: Vitals:   11/12/20 1841 11/12/20 2241 11/13/20 0350 11/13/20 1428  BP: 97/71 112/68 110/63 102/61  Pulse: 73 76 80 75  Resp: 16 18 19 16   Temp:  (!) 97 F (36.1 C) 98.1 F (36.7 C) 98.4 F (36.9 C)  TempSrc:      SpO2: 100% 100% 100% 100%  Weight:      Height:        Intake/Output Summary (Last 24 hours) at 11/13/2020 1543 Last data filed at 11/13/2020 0800 Gross per 24 hour  Intake 0 ml  Output 600 ml  Net -600 ml   Weight change:  Exam:   General:  Pt is alert, follows commands appropriately, not in acute distress  HEENT:  No icterus, No thrush, No neck mass, [place]/AT  Cardiovascular: RRR, S1/S2, no rubs, no gallops  Respiratory: bilateral crackles. No wheeze  Abdomen: Soft/+BS, non tender, non distended, no guarding  Extremities: No edema, No lymphangitis, No petechiae, No rashes, no synovitis   Data  Reviewed: I have personally reviewed following labs and imaging studies Basic Metabolic Panel: Recent Labs  Lab 11/09/20 0421 11/10/20 0841 11/11/20 0404 11/12/20 0507 11/13/20 0537  NA 154* 155* 151* 145 140  K 3.5 2.8* 3.5 3.7 4.1  CL 126* 125* 123* 119* 116*  CO2 16* 21* 21* 20* 19*  GLUCOSE 129* 117* 128* 111* 90  BUN 83* 69* 57* 40* 32*  CREATININE 3.10* 2.53* 2.14* 1.71* 1.56*  CALCIUM 8.3* 7.8* 7.9* 7.6* 7.9*  MG  --   --  2.6*  --  1.8   Liver Function Tests: Recent Labs  Lab 11/09/20 0421 11/10/20 0841 11/11/20 0404 11/12/20 0507 11/13/20 0537  AST 53* 56* 59* 51* 48*  ALT 22 24 26 23 23   ALKPHOS 60 61 61 59 61  BILITOT 1.8* 2.2* 2.0*  2.1* 1.4* 1.4*  PROT 6.5 5.9* 6.1* 5.8* 6.0*  ALBUMIN 2.9* 2.6* 2.5* 2.2* 2.2*   No results for input(s): LIPASE, AMYLASE in the last 168 hours. No results for input(s): AMMONIA in the last 168 hours. Coagulation Profile: Recent Labs  Lab 11/08/20 0950  INR 1.9*   CBC: Recent Labs  Lab 11/07/20 1528 11/08/20 0950 11/09/20 0421 11/10/20 0841 11/11/20 0404 11/12/20 0507 11/13/20 0537  WBC 10.6*   < > 9.7 7.0 6.7 7.4 7.2  NEUTROABS 8.6*  --   --   --   --   --   --   HGB 6.8*   < > 8.5* 8.9* 9.8* 9.1* 9.4*  HCT 22.2*   < > 28.3* 28.2* 31.5* 28.5* 30.0*  MCV 111.0*   < > 104.4* 100.7* 102.9* 102.2* 102.4*  PLT 266   < > 148* 132* 121* 121* 135*   < > = values in this interval not displayed.   Cardiac Enzymes: No results for input(s): CKTOTAL, CKMB, CKMBINDEX, TROPONINI in the last 168 hours. BNP: Invalid input(s): POCBNP CBG: Recent Labs  Lab 11/07/20 1418 11/09/20 0735 11/10/20 1535  GLUCAP 118* 113* 99   HbA1C: No results for input(s): HGBA1C in the last 72 hours. Urine analysis:    Component Value Date/Time   COLORURINE AMBER (A) 11/13/2020 0529   APPEARANCEUR CLOUDY (A) 11/13/2020 0529   LABSPEC 1.016 11/13/2020 0529   PHURINE 6.0 11/13/2020 0529   GLUCOSEU 50 (A) 11/13/2020 0529   HGBUR LARGE  (A) 11/13/2020 0529   BILIRUBINUR NEGATIVE 11/13/2020 0529   KETONESUR NEGATIVE 11/13/2020 0529   PROTEINUR 100 (A) 11/13/2020 0529   UROBILINOGEN 0.2 05/26/2015 1521   NITRITE NEGATIVE 11/13/2020 0529   LEUKOCYTESUR SMALL (A) 11/13/2020 0529   Sepsis Labs: @LABRCNTIP (procalcitonin:4,lacticidven:4) ) Recent Results (from the past 240 hour(s))  Resp Panel by RT-PCR (Flu A&B, Covid) Nasopharyngeal Swab     Status: None   Collection Time: 11/07/20  2:44 PM   Specimen: Nasopharyngeal Swab; Nasopharyngeal(NP) swabs in vial transport medium  Result Value Ref Range Status   SARS Coronavirus 2 by RT PCR NEGATIVE NEGATIVE Final    Comment: (NOTE) SARS-CoV-2 target nucleic acids are NOT DETECTED.  The SARS-CoV-2 RNA is generally detectable in upper respiratory specimens during the acute phase of infection. The lowest concentration of SARS-CoV-2 viral copies this assay can detect is 138 copies/mL. A negative  result does not preclude SARS-Cov-2 infection and should not be used as the sole basis for treatment or other patient management decisions. A negative result may occur with  improper specimen collection/handling, submission of specimen other than nasopharyngeal swab, presence of viral mutation(s) within the areas targeted by this assay, and inadequate number of viral copies(<138 copies/mL). A negative result must be combined with clinical observations, patient history, and epidemiological information. The expected result is Negative.  Fact Sheet for Patients:  BloggerCourse.com  Fact Sheet for Healthcare Providers:  SeriousBroker.it  This test is no t yet approved or cleared by the [place] FDA and  has been authorized for detection and/or diagnosis of SARS-CoV-2 by FDA under an Emergency Use Authorization (EUA). This EUA will remain  in effect (meaning this test can be used) for the duration of the COVID-19 declaration under  Section 564(b)(1) of the Act, 21 U.S.C.section 360bbb-3(b)(1), unless the authorization is terminated  or revoked sooner.       Influenza A by PCR NEGATIVE NEGATIVE Final   Influenza B by PCR NEGATIVE NEGATIVE Final    Comment: (NOTE) The Xpert Xpress SARS-CoV-2/FLU/RSV plus assay is intended as an aid in the diagnosis of influenza from Nasopharyngeal swab specimens and should not be used as a sole basis for treatment. Nasal washings and aspirates are unacceptable for Xpert Xpress SARS-CoV-2/FLU/RSV testing.  Fact Sheet for Patients: BloggerCourse.com  Fact Sheet for Healthcare Providers: SeriousBroker.it  This test is not yet approved or cleared by the [place] FDA and has been authorized for detection and/or diagnosis of SARS-CoV-2 by FDA under an Emergency Use Authorization (EUA). This EUA will remain in effect (meaning this test can be used) for the duration of the COVID-19 declaration under Section 564(b)(1) of the Act, 21 U.S.C. section 360bbb-3(b)(1), unless the authorization is terminated or revoked.  Performed at [facility], [address] [place], [place] [address]   Culture, blood (Routine X 2) w Reflex to ID Panel     Status: None   Collection Time: 11/07/20  3:28 PM   Specimen: BLOOD RIGHT FOREARM  Result Value Ref Range Status   Specimen Description   Final    BLOOD RIGHT FOREARM BOTTLES DRAWN AEROBIC AND ANAEROBIC   Special Requests Blood Culture adequate volume  Final   Culture   Final    NO GROWTH 6 DAYS Performed at [facility], [address] [place], [place] [address]    Report Status 11/13/2020 FINAL  Final  Culture, blood (Routine X 2) w Reflex to ID Panel     Status: None   Collection Time: 11/07/20  3:59 PM   Specimen: Left Antecubital; Blood  Result Value Ref Range Status   Specimen Description LEFT ANTECUBITAL  Final   Special Requests   Final    BOTTLES DRAWN AEROBIC AND ANAEROBIC  Blood Culture adequate volume   Culture   Final    NO GROWTH 6 DAYS Performed at [facility], [address] [place], [place] [address]    Report Status 11/13/2020 FINAL  Final  Urine Culture     Status: Abnormal   Collection Time: 11/07/20  5:00 PM   Specimen: Urine, Clean Catch  Result Value Ref Range Status   Specimen Description   Final    URINE, CLEAN CATCH Performed at [facility], [address], [place], [place] [address]    Special Requests   Final    NONE Performed at [facility], [address] [place], [place] [address]  Culture MULTIPLE SPECIES PRESENT, SUGGEST RECOLLECTION (A)  Final   Report Status 11/09/2020 FINAL  Final  Culture, Urine     Status: Abnormal   Collection Time: 11/08/20  3:51 PM   Specimen: Urine, Clean Catch  Result Value Ref Range Status   Specimen Description   Final    URINE, CLEAN CATCH Performed at [facility], [address], [place], [place] [address]    Special Requests   Final    NONE Performed at [facility], [address], [place], [place] [address]    Culture MULTIPLE SPECIES PRESENT, SUGGEST RECOLLECTION (A)  Final   Report Status 11/10/2020 FINAL  Final  MRSA PCR Screening     Status: Abnormal   Collection Time: 11/08/20  4:00 PM   Specimen: Nasopharyngeal  Result Value Ref Range Status   MRSA by PCR POSITIVE (A) NEGATIVE Final    Comment:        The GeneXpert MRSA Assay (FDA approved for NASAL specimens only), is one component of a comprehensive MRSA colonization surveillance program. It is not intended to diagnose MRSA infection nor to guide or monitor treatment for MRSA infections. RESULT CALLED TO, READ BACK BY AND VERIFIED WITH: K LOOMIS AT 1836 ON 11/08/2020 BY MOSLEY,J Performed at [facility], [address], [place], [place] [address]      Scheduled Meds: . sodium chloride   Intravenous Once  . sodium chloride   Intravenous Once  . amiodarone  100 mg Oral Daily  . apixaban  2.5 mg Oral BID  .  Chlorhexidine Gluconate Cloth  6 each Topical Q0600  . dronabinol  5 mg Oral BID AC  . levothyroxine  125 mcg Oral QAC breakfast  . metroNIDAZOLE  500 mg Oral Q8H  . pantoprazole  40 mg Intravenous Q12H  . sertraline  25 mg Oral Daily  . sucralfate  1 g Oral TID WC & HS   Continuous Infusions: . sodium chloride 20 mL/hr at 11/11/20 1827  . cefTRIAXone (ROCEPHIN)  IV 1 g (11/13/20 1200)  . dextrose 5 % with KCl 20 mEq / L 20 mEq (11/13/20 0626)    Procedures/Studies: CT ABDOMEN PELVIS WO CONTRAST  Result Date: 11/07/2020 CLINICAL DATA:  Melena with weakness and lethargy. EXAM: CT ABDOMEN AND PELVIS WITHOUT CONTRAST TECHNIQUE: Multidetector CT imaging of the abdomen and pelvis was performed following the standard protocol without IV contrast. COMPARISON:  September 26, 2020 FINDINGS: Lower chest: Mild atelectasis and/or infiltrate is seen within the right lung base. There is a moderate sized right pleural effusion. A small left pleural effusion is also noted. Hepatobiliary: No focal liver abnormality is seen. No gallstones, gallbladder wall thickening, or biliary dilatation. Pancreas: Unremarkable. No pancreatic ductal dilatation or surrounding inflammatory changes. Spleen: Normal in size without focal abnormality. Adrenals/Urinary Tract: Adrenal glands are unremarkable. The left kidney is normal in size. Moderate severity diffuse cortical thinning with renal atrophy is seen on the right. A stable 1.6 cm diameter cyst is noted within the posterior aspect of the mid left kidney. A 2.7 mm staghorn calculus is seen on the right with additional innumerable subcentimeter non-obstructing renal stones present throughout the right kidney. A moderate amount of air is seen within the calices of the right kidney and proximal right ureter. This represents a new finding when compared to the prior exam. No perinephric or periureteral inflammatory fat stranding is seen. A mild-to-moderate amount of air is seen within  the lumen of a urinary bladder diverticulum. A small amount  of air is seen within this region on the prior study. Stomach/Bowel: Stomach is within normal limits. The appendix is not clearly identified. No evidence of bowel dilatation. Mild to moderate severity thickening of the mid to distal sigmoid colon is seen (axial CT images 64 through 74, CT series number 2). Vascular/Lymphatic: Aortic atherosclerosis. No enlarged abdominal or pelvic lymph nodes. Reproductive: The prostate gland is poorly visualized secondary to overlying streak artifact. Other: A stable posterolateral right abdominal wall hernia is seen. No abdominopelvic ascites. Musculoskeletal: A chronic eleventh right rib fracture is seen. A left hip replacement is seen with associated streak artifact and subsequently limited evaluation of the adjacent osseous and soft tissue structures. Multilevel degenerative changes seen throughout the lumbar spine. IMPRESSION: 1. Mild to moderate severity colitis involving the mid to distal sigmoid colon. Follow-up to resolution is recommended to exclude the presence of an underlying neoplasm. 2. Moderate amount of calyceal air within the right kidney and proximal right ureter which the secondary to recent instrumentation. The presence of an underlying infectious process cannot be excluded. Correlation with urinalysis is recommended. 3. Bilateral pleural effusions, right greater than left. 4. Atrophic right kidney with extensive right-sided nephrolithiasis. 5. Left hip replacement. 6. Aortic atherosclerosis. Aortic Atherosclerosis (ICD10-I70.0). Electronically Signed   By: Aram Candela M.D.   On: 11/07/2020 17:25   DG Chest 1 View  Result Date: 11/07/2020 CLINICAL DATA:  Altered mental status. EXAM: CHEST  1 VIEW COMPARISON:  September 20, 2020 FINDINGS: A single lead ventricular pacer is in place. There is no evidence of acute infiltrate. A small, stable right pleural effusion is noted. No pneumothorax is  seen. The heart size and mediastinal contours are within normal limits. Degenerative changes are present throughout the thoracic spine. IMPRESSION: 1. Small, stable right pleural effusion. Electronically Signed   By: Aram Candela M.D.   On: 11/07/2020 15:34   CT Head Wo Contrast  Result Date: 11/07/2020 CLINICAL DATA:  Weakness and lethargy. EXAM: CT HEAD WITHOUT CONTRAST TECHNIQUE: Contiguous axial images were obtained from the base of the skull through the vertex without intravenous contrast. COMPARISON:  September 20, 2020 FINDINGS: Brain: There is mild cerebral atrophy with widening of the extra-axial spaces and ventricular dilatation. There are areas of decreased attenuation within the white matter tracts of the supratentorial brain, consistent with microvascular disease changes. Vascular: No hyperdense vessel or unexpected calcification. Skull: Normal. Negative for fracture or focal lesion. Sinuses/Orbits: No acute finding. Other: None. IMPRESSION: 1. Generalized cerebral atrophy. 2. No acute intracranial abnormality. Electronically Signed   By: Aram Candela M.D.   On: 11/07/2020 15:59    Catarina Hartshorn, DO  [facility]  If 7PM-7AM, please contact night-coverage [email or web address] Password [email or web address] 11/13/2020, 3:43 PM   LOS: 6 days

## 2020-11-13 NOTE — Progress Notes (Signed)
Patient had amber/red tinged urine upon admission to 300.  Foley catheter in tact.  Notified md of color of urine.  Received order for ua.  Will continue to monitor patient.

## 2020-11-13 NOTE — Progress Notes (Signed)
  Speech Language Pathology Treatment: Dysphagia  Patient Details Name: Ryan Macdonald MRN: [id number] DOB: 20-Jun-1934 Today's Date: 11/13/2020 Time: 4235-3614 SLP Time Calculation (min) (ACUTE ONLY): 22 min  Assessment / Plan / Recommendation Clinical Impression  Pt seen in room for follow up of clinical swallow evaluation completed yesterday. Pt's wife was present for today's visit and she shares that Pt's upper dentures are at the [facility], but that his po intake has been poor for the past couple of months (she said no one fed him at the [facility] and he would not initiate on his own). Pt agreeable to sips of peach Breeze with me today and declined all puree trials. Pt interest in po is limited. The SLP at the [facility] stated that he was given soft textures, but he only moved them around in his mouth and did not swallow so he was changed to puree. His wife plans to obtain his dentures. Continue puree with thin for now. SLP will follow.    HPI HPI: 86 y.o. male  with past medical history of CHF with EF of 20 to 25%, A. fib, and stage III chronic kidney disease,  admitted on 11/07/2020 with increasing lethargy and decreased oral intake.  Per his spouse he was sent here in order to be transferred to [facility] for a feeding tube.  However work-up revealed anemia with a hemoglobin of 6.8, acute kidney injury, UTI.  GI consulted and an EGD was conducted showing a cirrhosis of esophagitis and gastritis.  Being treated with IV fluid resuscitation and antibiotics.  Showing great improvement in his mental status and oral intake. BSE requested.      SLP Plan  Continue with current plan of care       Recommendations  Diet recommendations: Dysphagia 1 (puree);Thin liquid Liquids provided via: Cup;Straw Medication Administration: Crushed with puree Supervision: Staff to assist with self feeding;Full supervision/cueing for compensatory strategies Compensations: Slow rate;Small  sips/bites;Follow solids with liquid Postural Changes and/or Swallow Maneuvers: Seated upright 90 degrees;Upright 30-60 min after meal                Oral Care Recommendations: Oral care BID;Staff/trained caregiver to provide oral care Follow up Recommendations: 24 hour supervision/assistance SLP Visit Diagnosis: Dysphagia, unspecified (R13.10) Plan: Continue with current plan of care       Thank you,  Havery Moros, CCC-SLP [phone]                 Cicely Ortner 11/13/2020, 3:43 PM

## 2020-11-14 ENCOUNTER — Encounter (HOSPITAL_COMMUNITY): Payer: Self-pay | Admitting: Internal Medicine

## 2020-11-14 DIAGNOSIS — N200 Calculus of kidney: Secondary | ICD-10-CM

## 2020-11-14 DIAGNOSIS — R4182 Altered mental status, unspecified: Secondary | ICD-10-CM

## 2020-11-14 DIAGNOSIS — A419 Sepsis, unspecified organism: Secondary | ICD-10-CM

## 2020-11-14 DIAGNOSIS — R310 Gross hematuria: Secondary | ICD-10-CM

## 2020-11-14 DIAGNOSIS — N10 Acute tubulo-interstitial nephritis: Secondary | ICD-10-CM

## 2020-11-14 DIAGNOSIS — R6520 Severe sepsis without septic shock: Secondary | ICD-10-CM

## 2020-11-14 LAB — BASIC METABOLIC PANEL
Anion gap: 6 (ref 5–15)
BUN: 29 mg/dL — ABNORMAL HIGH (ref 8–23)
CO2: 16 mmol/L — ABNORMAL LOW (ref 22–32)
Calcium: 8.1 mg/dL — ABNORMAL LOW (ref 8.9–10.3)
Chloride: 114 mmol/L — ABNORMAL HIGH (ref 98–111)
Creatinine, Ser: 1.72 mg/dL — ABNORMAL HIGH (ref 0.61–1.24)
GFR, Estimated: 39 mL/min — ABNORMAL LOW (ref >60)
Glucose, Bld: 92 mg/dL (ref 70–99)
Potassium: 4.2 mmol/L (ref 3.5–5.1)
Sodium: 136 mmol/L (ref 135–145)

## 2020-11-14 LAB — CBC
HCT: 27.7 % — ABNORMAL LOW (ref 39.0–52.0)
Hemoglobin: 8.5 g/dL — ABNORMAL LOW (ref 13.0–17.0)
MCH: 32.4 pg (ref 26.0–34.0)
MCHC: 30.7 g/dL (ref 30.0–36.0)
MCV: 105.7 fL — ABNORMAL HIGH (ref 80.0–100.0)
Platelets: 148 10*3/uL — ABNORMAL LOW (ref 150–400)
RBC: 2.62 MIL/uL — ABNORMAL LOW (ref 4.22–5.81)
RDW: 21.2 % — ABNORMAL HIGH (ref 11.5–15.5)
WBC: 8.3 10*3/uL (ref 4.0–10.5)
nRBC: 0 % (ref 0.0–0.2)

## 2020-11-14 LAB — URINE CULTURE: Culture: <10000 — AB

## 2020-11-14 MED ORDER — CHLORHEXIDINE GLUCONATE CLOTH 2 % EX PADS
6.0000 | MEDICATED_PAD | Freq: Every day | CUTANEOUS | Status: DC
  Administered 2020-11-15: 10:00:00 6 via TOPICAL

## 2020-11-14 NOTE — Progress Notes (Signed)
PROGRESS NOTE  Ryan Macdonald QBH:[id number] DOB: [birth year] DOA: 11/07/2020 PCP: Sherol Dade, DO  Brief History:  86 y.o.male,malewith medical history significant ofHFrEF (EF 20 to 25%, AICD in place), paroxysmal A. fib on amiodarone and Eliquis, hypertension, chronic kidney disease 3B, hyperlipidemia on a statin, gout, hypothyroidism, and recent sepsis related to a UTIpatient with multiple recent hospitalizations; He wasaltered, could notprovide any history, it was obtained from ED staff and wife by phone. -Patient was sent by [facility] for increased weakness, lethargy, and decreased oral intake, in ED patient was noted to be unresponsive at all, medicated provide any complaints, patient was noted to to have melena while in ED, Hemoccult came back positive, as well wife mentioned patient with poor oral intake recently, and there was discussion with facility staff about arranging for peg tube during his poor oral intake. -in EDevaluation was significant for melena, Hemoccult positive stool, with hemoglobin of 6.8, as well of sodium of 157, worsening creatinine and BUN, creatinine 3.76, BUN of 92, BNP elevated at 306, mildly elevated AST at 74, CT abdomen and pelvis was significant for colitis, as well there is some evidence of right pleural effusion, and UA was strongly positive, as well patient was noted to have soft blood pressure, [facility] consulted to admit.  Assessment/Plan: Acute blood loss anemia -Secondary to GI bleeding and hematuria -Patient noted to have melena, FOBT positive -Hemoglobin 6.8 on admission -He has received a total of 3 units of PRBCs thus far with follow-up hemoglobinhas been stable -Continue to follow hemoglobin and transfuse as needed -12/13/21EGDLA grade Cerosive esophagitis without bleedingsevere gastritis without bleeding, normal examined duodenum;  -PPI infusion completed yesterday -continue on PPI twice daily  as well as Carafate  Acute kidney injury on chronic kidney disease stage IIIb -Likely related to volume depletion andhemodynamic changes -No hydronephrosis reported on imaging -Continue IV fluids, monitor urine output -baseline creatinine 1.4-1.7  Hypotension -Suspect this is related to hypovolemia secondary to severe dehydration as well as GI bleeding -Continue to hold antihypertensives -Continue volume resuscitation -He also received albumin infusions since he is severely hypoalbuminemic and would likely third space IV fluids  Lactic acidosis -present on admission -lactic acid peaked 3.9 -Related to dehydration and hypovolemia -Continue IV fluid resuscitation  Paroxysmal atrial fibrillation -Restart amiodarone -Restart Eliquis per GI  Hypernatremia -Related to severe dehydration and free water deficit -Continue hypotonic fluids>>improved  Pyelonephritis/Hematuria -Urinalysis indicates possible infection -Based on sepsis criteria on admission, sepsis has been ruled out -He is on intravenous antibiotics -Urine culture shows multiple species -11/12/20 eve--developed hematuria again--suspect this is related to his staghorn calculus -12/16--repeat UA and urine culture -stop apixaban temporarily -am CBC -discussed with urology, Dr. Elvera Maria consult-->continue nonoperative management  colitis -Noted on CT abdomen -Continued on ceftriaxone and Flagyl -GI following -no further hematochezia, melena  Acute metabolic encephalopathy -Suspect this is related to severe dehydration, hypernatremia -Overall mental status appears to be slowly improving. He does wake up to voice and does engage in some conversation  Chronic systolic congestive heart failure/nonischemic cardiomyopathy -Ejection fraction of 20 to 25%, AICD in place -Holding diuretics in the setting of hypotension/renal failure -Continue to monitor volume status  Severe protein calorie  malnutrition -P.o. intake has been poor -Nutrition consult -It is reported that patient was taking nectar thick liquids at the nursing facility -He will be started on a pured diet and nectar thick liquids until he can be seen by speech therapy -  request repeat speech eval--hopefully to liberalize diet  Hypothyroidism -Continue on Synthroid  Hypokalemia -Replace -check mag--1.8  Goals of care -Discussed advanced directives with patient's wife Cristen Murcia again on 12/17--now interested in going home with hospice -She has discussed CODE STATUS with patient's children and all are in agreement for DNR status -Patient seen by palliative care to address further goals of care -Decision made against feeding tube -We will need to monitor p.o. intake, if he does not have significant intake,then hemay be more appropriate for hospice       Status is: Inpatient  Remains inpatient appropriate because:IV treatments appropriate due to intensity of illness or inability to take PO   Dispo: The patient is from: Home              Anticipated d/c is to: Home              Anticipated d/c date is: 1 day              Patient currently is not medically stable to d/c.        Family Communication:  Spouse updated 12/18  Consultants:  GI, palliative  Code Status: DNR  DVT Prophylaxis:  apixaban on hold   Procedures: As Listed in Progress Note Above  Antibiotics: Ceftriaxone 12/14>> Metronidazole 12/11>> Cefepime 12/10>>12/14  RN Pressure Injury Documentation: Pressure Injury 11/08/20 Buttocks Left Stage 2 -  Partial thickness loss of dermis presenting as a shallow open injury with a red, pink wound bed without slough. (Active)  11/08/20 1400  Location: Buttocks  Location Orientation: Left  Staging: Stage 2 -  Partial thickness loss of dermis presenting as a shallow open injury with a red, pink wound bed without slough.  Wound Description (Comments):   Present on  Admission: Yes     Pressure Injury 11/08/20 Sacrum Left Stage 2 -  Partial thickness loss of dermis presenting as a shallow open injury with a red, pink wound bed without slough. (Active)  11/08/20 1400  Location: Sacrum  Location Orientation: Left  Staging: Stage 2 -  Partial thickness loss of dermis presenting as a shallow open injury with a red, pink wound bed without slough.  Wound Description (Comments):   Present on Admission: Yes     Pressure Injury 11/11/20 Groin Right Stage 2 -  Partial thickness loss of dermis presenting as a shallow open injury with a red, pink wound bed without slough. (Active)  11/11/20 0900  Location: Groin  Location Orientation: Right  Staging: Stage 2 -  Partial thickness loss of dermis presenting as a shallow open injury with a red, pink wound bed without slough.  Wound Description (Comments):   Present on Admission:         Subjective: Patient denies fevers, chills, headache, chest pain, dyspnea, nausea, vomiting, diarrhea, abdominal pain, dysuria, hematuria, hematochezia, and melena. He states he wants to go home  Objective: Vitals:   11/13/20 1428 11/13/20 2025 11/14/20 0342 11/14/20 1500  BP: 102/61 110/70 (!) 94/59 (!) 155/86  Pulse: 75 99 91 77  Resp: Temp: 98.4 F (36.9 C) 97.8 F (36.6 C) 98.3 F (36.8 C) 98.4 F (36.9 C)  TempSrc:    Oral  SpO2: 100% 100% 100% 100%  Weight:      Height:        Intake/Output Summary (Last 24 hours) at 11/14/2020 1701 Last data filed at 11/14/2020 1200 Gross per 24 hour  Intake 1000 ml  Output 150  ml  Net 850 ml   Weight change:  Exam:   General:  Pt is alert, follows commands appropriately, not in acute distress  HEENT: No icterus, No thrush, No neck mass, [place]/AT  Cardiovascular: RRR, S1/S2, no rubs, no gallops  Respiratory: bibasilar rales. No wheeze  Abdomen: Soft/+BS, non tender, non distended, no guarding  Extremities: No edema, No lymphangitis, No petechiae, No  rashes, no synovitis   Data Reviewed: I have personally reviewed following labs and imaging studies Basic Metabolic Panel: Recent Labs  Lab 11/10/20 0841 11/11/20 0404 11/12/20 0507 11/13/20 0537 11/14/20 0357  NA 155* 151* 145 140 136  K 2.8* 3.5 3.7 4.1 4.2  CL 125* 123* 119* 116* 114*  CO2 21* 21* 20* 19* 16*  GLUCOSE 117* 128* 111* 90 92  BUN 69* 57* 40* 32* 29*  CREATININE 2.53* 2.14* 1.71* 1.56* 1.72*  CALCIUM 7.8* 7.9* 7.6* 7.9* 8.1*  MG  --  2.6*  --  1.8  --    Liver Function Tests: Recent Labs  Lab 11/09/20 0421 11/10/20 0841 11/11/20 0404 11/12/20 0507 11/13/20 0537  AST 53* 56* 59* 51* 48*  ALT 22 24 26 23 23   ALKPHOS 60 61 61 59 61  BILITOT 1.8* 2.2* 2.0*  2.1* 1.4* 1.4*  PROT 6.5 5.9* 6.1* 5.8* 6.0*  ALBUMIN 2.9* 2.6* 2.5* 2.2* 2.2*   No results for input(s): LIPASE, AMYLASE in the last 168 hours. No results for input(s): AMMONIA in the last 168 hours. Coagulation Profile: Recent Labs  Lab 11/08/20 0950  INR 1.9*   CBC: Recent Labs  Lab 11/10/20 0841 11/11/20 0404 11/12/20 0507 11/13/20 0537 11/14/20 0357  WBC 7.0 6.7 7.4 7.2 8.3  HGB 8.9* 9.8* 9.1* 9.4* 8.5*  HCT 28.2* 31.5* 28.5* 30.0* 27.7*  MCV 100.7* 102.9* 102.2* 102.4* 105.7*  PLT 132* 121* 121* 135* 148*   Cardiac Enzymes: No results for input(s): CKTOTAL, CKMB, CKMBINDEX, TROPONINI in the last 168 hours. BNP: Invalid input(s): POCBNP CBG: Recent Labs  Lab 11/09/20 0735 11/10/20 1535  GLUCAP 113* 99   HbA1C: No results for input(s): HGBA1C in the last 72 hours. Urine analysis:    Component Value Date/Time   COLORURINE RED (A) 11/13/2020 1736   APPEARANCEUR CLOUDY (A) 11/13/2020 1736   LABSPEC  11/13/2020 1736    TEST NOT REPORTED DUE TO COLOR INTERFERENCE OF URINE PIGMENT   PHURINE  11/13/2020 1736    TEST NOT REPORTED DUE TO COLOR INTERFERENCE OF URINE PIGMENT   GLUCOSEU (A) 11/13/2020 1736    TEST NOT REPORTED DUE TO COLOR INTERFERENCE OF URINE PIGMENT    HGBUR (A) 11/13/2020 1736    TEST NOT REPORTED DUE TO COLOR INTERFERENCE OF URINE PIGMENT   BILIRUBINUR (A) 11/13/2020 1736    TEST NOT REPORTED DUE TO COLOR INTERFERENCE OF URINE PIGMENT   KETONESUR (A) 11/13/2020 1736    TEST NOT REPORTED DUE TO COLOR INTERFERENCE OF URINE PIGMENT   PROTEINUR (A) 11/13/2020 1736    TEST NOT REPORTED DUE TO COLOR INTERFERENCE OF URINE PIGMENT   UROBILINOGEN 0.2 05/26/2015 1521   NITRITE (A) 11/13/2020 1736    TEST NOT REPORTED DUE TO COLOR INTERFERENCE OF URINE PIGMENT   LEUKOCYTESUR (A) 11/13/2020 1736    TEST NOT REPORTED DUE TO COLOR INTERFERENCE OF URINE PIGMENT   Sepsis Labs: @LABRCNTIP (procalcitonin:4,lacticidven:4) ) Recent Results (from the past 240 hour(s))  Resp Panel by RT-PCR (Flu A&B, Covid) Nasopharyngeal Swab     Status: None   Collection Time: 11/07/20  2:44 PM   Specimen: Nasopharyngeal Swab; Nasopharyngeal(NP) swabs in vial transport medium  Result Value Ref Range Status   SARS Coronavirus 2 by RT PCR NEGATIVE NEGATIVE Final    Comment: (NOTE) SARS-CoV-2 target nucleic acids are NOT DETECTED.  The SARS-CoV-2 RNA is generally detectable in upper respiratory specimens during the acute phase of infection. The lowest concentration of SARS-CoV-2 viral copies this assay can detect is 138 copies/mL. A negative result does not preclude SARS-Cov-2 infection and should not be used as the sole basis for treatment or other patient management decisions. A negative result may occur with  improper specimen collection/handling, submission of specimen other than nasopharyngeal swab, presence of viral mutation(s) within the areas targeted by this assay, and inadequate number of viral copies(<138 copies/mL). A negative result must be combined with clinical observations, patient history, and epidemiological information. The expected result is Negative.  Fact Sheet for Patients:  BloggerCourse.com  Fact Sheet for  Healthcare Providers:  SeriousBroker.it  This test is no t yet approved or cleared by the [place] FDA and  has been authorized for detection and/or diagnosis of SARS-CoV-2 by FDA under an Emergency Use Authorization (EUA). This EUA will remain  in effect (meaning this test can be used) for the duration of the COVID-19 declaration under Section 564(b)(1) of the Act, 21 U.S.C.section 360bbb-3(b)(1), unless the authorization is terminated  or revoked sooner.       Influenza A by PCR NEGATIVE NEGATIVE Final   Influenza B by PCR NEGATIVE NEGATIVE Final    Comment: (NOTE) The Xpert Xpress SARS-CoV-2/FLU/RSV plus assay is intended as an aid in the diagnosis of influenza from Nasopharyngeal swab specimens and should not be used as a sole basis for treatment. Nasal washings and aspirates are unacceptable for Xpert Xpress SARS-CoV-2/FLU/RSV testing.  Fact Sheet for Patients: BloggerCourse.com  Fact Sheet for Healthcare Providers: SeriousBroker.it  This test is not yet approved or cleared by the [place] FDA and has been authorized for detection and/or diagnosis of SARS-CoV-2 by FDA under an Emergency Use Authorization (EUA). This EUA will remain in effect (meaning this test can be used) for the duration of the COVID-19 declaration under Section 564(b)(1) of the Act, 21 U.S.C. section 360bbb-3(b)(1), unless the authorization is terminated or revoked.  Performed at [facility], [address] [place], [place] [address]   Culture, blood (Routine X 2) w Reflex to ID Panel     Status: None   Collection Time: 11/07/20  3:28 PM   Specimen: BLOOD RIGHT FOREARM  Result Value Ref Range Status   Specimen Description   Final    BLOOD RIGHT FOREARM BOTTLES DRAWN AEROBIC AND ANAEROBIC   Special Requests Blood Culture adequate volume  Final   Culture   Final    NO GROWTH 6 DAYS Performed at [facility], [address], [place], [place] [address]    Report Status 11/13/2020 FINAL  Final  Culture, blood (Routine X 2) w Reflex to ID Panel     Status: None   Collection Time: 11/07/20  3:59 PM   Specimen: Left Antecubital; Blood  Result Value Ref Range Status   Specimen Description LEFT ANTECUBITAL  Final   Special Requests   Final    BOTTLES DRAWN AEROBIC AND ANAEROBIC Blood Culture adequate volume   Culture   Final    NO GROWTH 6 DAYS Performed at [facility], [address], [place], [place] [address]    Report Status 11/13/2020 FINAL  Final  Urine  Culture     Status: Abnormal   Collection Time: 11/07/20  5:00 PM   Specimen: Urine, Clean Catch  Result Value Ref Range Status   Specimen Description   Final    URINE, CLEAN CATCH Performed at [facility], [address], [place], [place] [address]    Special Requests   Final    NONE Performed at [facility], [address] [place], [place] [address]    Culture MULTIPLE SPECIES PRESENT, SUGGEST RECOLLECTION (A)  Final   Report Status 11/09/2020 FINAL  Final  Culture, Urine     Status: Abnormal   Collection Time: 11/08/20  3:51 PM   Specimen: Urine, Clean Catch  Result Value Ref Range Status   Specimen Description   Final    URINE, CLEAN CATCH Performed at [facility], [address] [place], [place] [address]    Special Requests   Final    NONE Performed at [facility], [address], [place], [place] [address]    Culture MULTIPLE SPECIES PRESENT, SUGGEST RECOLLECTION (A)  Final   Report Status 11/10/2020 FINAL  Final  MRSA PCR Screening     Status: Abnormal   Collection Time: 11/08/20  4:00 PM   Specimen: Nasopharyngeal  Result Value Ref Range Status   MRSA by PCR POSITIVE (A) NEGATIVE Final    Comment:        The GeneXpert MRSA Assay (FDA approved for NASAL specimens only), is one component of a comprehensive MRSA colonization surveillance program. It is not intended to diagnose MRSA infection nor to guide  or monitor treatment for MRSA infections. RESULT CALLED TO, READ BACK BY AND VERIFIED WITH: K LOOMIS AT 1836 ON 11/08/2020 BY MOSLEY,J Performed at [facility], [address] [place], [place] [address]      Scheduled Meds: . sodium chloride   Intravenous Once  . sodium chloride   Intravenous Once  . amiodarone  100 mg Oral Daily  . Chlorhexidine Gluconate Cloth  6 each Topical Daily  . dronabinol  5 mg Oral BID AC  . levothyroxine  125 mcg Oral QAC breakfast  . metroNIDAZOLE  500 mg Oral Q8H  . pantoprazole  40 mg Oral BID AC  . sertraline  25 mg Oral Daily  . sucralfate  1 g Oral TID WC & HS   Continuous Infusions: . cefTRIAXone (ROCEPHIN)  IV 2 g (11/14/20 1148)    Procedures/Studies: CT ABDOMEN PELVIS WO CONTRAST  Result Date: 11/07/2020 CLINICAL DATA:  Melena with weakness and lethargy. EXAM: CT ABDOMEN AND PELVIS WITHOUT CONTRAST TECHNIQUE: Multidetector CT imaging of the abdomen and pelvis was performed following the standard protocol without IV contrast. COMPARISON:  September 26, 2020 FINDINGS: Lower chest: Mild atelectasis and/or infiltrate is seen within the right lung base. There is a moderate sized right pleural effusion. A small left pleural effusion is also noted. Hepatobiliary: No focal liver abnormality is seen. No gallstones, gallbladder wall thickening, or biliary dilatation. Pancreas: Unremarkable. No pancreatic ductal dilatation or surrounding inflammatory changes. Spleen: Normal in size without focal abnormality. Adrenals/Urinary Tract: Adrenal glands are unremarkable. The left kidney is normal in size. Moderate severity diffuse cortical thinning with renal atrophy is seen on the right. A stable 1.6 cm diameter cyst is noted within the posterior aspect of the mid left kidney. A 2.7 mm staghorn calculus is seen on the right with additional innumerable subcentimeter non-obstructing renal stones present throughout the right kidney. A moderate amount of air is seen  within the calices  of the right kidney and proximal right ureter. This represents a new finding when compared to the prior exam. No perinephric or periureteral inflammatory fat stranding is seen. A mild-to-moderate amount of air is seen within the lumen of a urinary bladder diverticulum. A small amount of air is seen within this region on the prior study. Stomach/Bowel: Stomach is within normal limits. The appendix is not clearly identified. No evidence of bowel dilatation. Mild to moderate severity thickening of the mid to distal sigmoid colon is seen (axial CT images 64 through 74, CT series number 2). Vascular/Lymphatic: Aortic atherosclerosis. No enlarged abdominal or pelvic lymph nodes. Reproductive: The prostate gland is poorly visualized secondary to overlying streak artifact. Other: A stable posterolateral right abdominal wall hernia is seen. No abdominopelvic ascites. Musculoskeletal: A chronic eleventh right rib fracture is seen. A left hip replacement is seen with associated streak artifact and subsequently limited evaluation of the adjacent osseous and soft tissue structures. Multilevel degenerative changes seen throughout the lumbar spine. IMPRESSION: 1. Mild to moderate severity colitis involving the mid to distal sigmoid colon. Follow-up to resolution is recommended to exclude the presence of an underlying neoplasm. 2. Moderate amount of calyceal air within the right kidney and proximal right ureter which the secondary to recent instrumentation. The presence of an underlying infectious process cannot be excluded. Correlation with urinalysis is recommended. 3. Bilateral pleural effusions, right greater than left. 4. Atrophic right kidney with extensive right-sided nephrolithiasis. 5. Left hip replacement. 6. Aortic atherosclerosis. Aortic Atherosclerosis (ICD10-I70.0). Electronically Signed   By: Aram Candela M.D.   On: 11/07/2020 17:25   DG Chest 1 View  Result Date: 11/07/2020 CLINICAL  DATA:  Altered mental status. EXAM: CHEST  1 VIEW COMPARISON:  September 20, 2020 FINDINGS: A single lead ventricular pacer is in place. There is no evidence of acute infiltrate. A small, stable right pleural effusion is noted. No pneumothorax is seen. The heart size and mediastinal contours are within normal limits. Degenerative changes are present throughout the thoracic spine. IMPRESSION: 1. Small, stable right pleural effusion. Electronically Signed   By: Aram Candela M.D.   On: 11/07/2020 15:34   CT Head Wo Contrast  Result Date: 11/07/2020 CLINICAL DATA:  Weakness and lethargy. EXAM: CT HEAD WITHOUT CONTRAST TECHNIQUE: Contiguous axial images were obtained from the base of the skull through the vertex without intravenous contrast. COMPARISON:  September 20, 2020 FINDINGS: Brain: There is mild cerebral atrophy with widening of the extra-axial spaces and ventricular dilatation. There are areas of decreased attenuation within the white matter tracts of the supratentorial brain, consistent with microvascular disease changes. Vascular: No hyperdense vessel or unexpected calcification. Skull: Normal. Negative for fracture or focal lesion. Sinuses/Orbits: No acute finding. Other: None. IMPRESSION: 1. Generalized cerebral atrophy. 2. No acute intracranial abnormality. Electronically Signed   By: Aram Candela M.D.   On: 11/07/2020 15:59    Catarina Hartshorn, DO  [facility]  If 7PM-7AM, please contact night-coverage [email or web address] Password [email or web address] 11/14/2020, 5:01 PM   LOS: 7 days

## 2020-11-14 NOTE — Consult Note (Signed)
Urology Consult  Referring physician: Dr. Arbutus Leas Reason for referral: Staghorn calculus, gross hematuria  Chief Complaint: Gross hematuria  History of Present Illness: Ryan Macdonald is a 86yo with a hx of CHPF and CKD who was admitted with altered mental statis. He was found to have a GI bleed, and UTI. He is currently on rocephin. Per the patient he has been having intermittent gross painless hematuria for the past 2 weeks.  On 12/10 he underwent CT of his abdomen which showed a right staghorn calculus with gas in is collecting system. He has remained afebrile and WBC is normal. He denies any flank pain. He currently has an indwelling foley with light brown urine. He denied any significant LUTS prior to admission. Multiple urine cultures have grown multiple species. NO other associated symptoms. No exacerbating/alleviaiting events.   Past Medical History:  Diagnosis Date  . Acute bronchitis 05/10/2017  . Acute respiratory failure (HCC)   . Acute respiratory failure with hypoxia (HCC) 09/27/2017  . AKI (acute kidney injury) (HCC) 02/2019  . AKI (acute kidney injury) (HCC) 03/14/2019  . Atrial fibrillation (HCC)   . CAP (community acquired pneumonia) 02/03/2017  . Cardiomyopathy (HCC)   . Cat scratch fever    removed mass on right side neck  . Cataracts, bilateral   . CHF (congestive heart failure) (HCC)   . CHF exacerbation (HCC) 09/27/2017  . Chronic kidney disease   . Corns and callosities   . Diabetes mellitus without complication (HCC)    TYPE 2  . Dyspnea   . Erythema intertrigo   . Flat foot   . Gout   . High cholesterol   . Hx of urethral stricture   . Hypertension   . Kidney stones   . Morbid obesity (HCC)   . Nonischemic cardiomyopathy (HCC)    a. ? 2009 Cath in [place] - nl cors per pt;  b. 09/2013 Echo: EF 25-30%, sev diff HK.  . Obesity   . Osteoarthritis   . PAF (paroxysmal atrial fibrillation) (HCC)    a. post-op hip in 2014 - prev on xarelto.  . Renal insufficiency     Past Surgical History:  Procedure Laterality Date  . CARDIAC CATHETERIZATION  1980's  . CARDIOVERSION N/A 01/15/2020   Procedure: CARDIOVERSION;  Surgeon: Laurey Morale, MD;  Location: [facility] ENDOSCOPY;  Service: Cardiovascular;  Laterality: N/A;  . CIRCUMCISION  1940's  . CORONARY STENT INTERVENTION N/A 08/18/2018   Procedure: CORONARY STENT INTERVENTION;  Surgeon: Corky Crafts, MD;  Location: [facility] INVASIVE CV LAB;  Service: Cardiovascular;  Laterality: N/A;  . CYSTOSCOPY WITH URETHRAL DILATATION  10/23/2013  . CYSTOSCOPY WITH URETHRAL DILATATION N/A 10/23/2013   Procedure: CYSTOSCOPY WITH URETHRAL DILATATION;  Surgeon: Kathi Ludwig, MD;  Location: [facility] OR;  Service: Urology;  Laterality: N/A;  . ESOPHAGOGASTRODUODENOSCOPY (EGD) WITH PROPOFOL N/A 11/10/2020   Procedure: ESOPHAGOGASTRODUODENOSCOPY (EGD) WITH PROPOFOL;  Surgeon: Lanelle Bal, DO;  Location: [facility] ENDO SUITE;  Service: Endoscopy;  Laterality: N/A;  . INCISION AND DRAINAGE OF WOUND Right 1980's   "cat scratch" (10/23/2013)  . INGUINAL HERNIA REPAIR Right 1950's  . INTRAVASCULAR ULTRASOUND/IVUS N/A 08/18/2018   Procedure: Intravascular Ultrasound/IVUS;  Surgeon: Corky Crafts, MD;  Location: [facility] INVASIVE CV LAB;  Service: Cardiovascular;  Laterality: N/A;  . KIDNEY STONE SURGERY Right 1970's; 1983   "twice"  . LEFT HEART CATH AND CORONARY ANGIOGRAPHY N/A 08/18/2018   Procedure: LEFT HEART CATH AND CORONARY ANGIOGRAPHY;  Surgeon: Corky Crafts, MD;  Location: [facility] INVASIVE CV LAB;  Service: Cardiovascular;  Laterality: N/A;  . RIGHT HEART CATH N/A 02/19/2020   Procedure: RIGHT HEART CATH;  Surgeon: Laurey Morale, MD;  Location: [facility] INVASIVE CV LAB;  Service: Cardiovascular;  Laterality: N/A;  . TEE WITHOUT CARDIOVERSION N/A 01/15/2020   Procedure: TRANSESOPHAGEAL ECHOCARDIOGRAM (TEE);  Surgeon: Laurey Morale, MD;  Location: [facility] ENDOSCOPY;  Service: Cardiovascular;  Laterality: N/A;  . TONSILLECTOMY AND  ADENOIDECTOMY  1940's  . TOTAL HIP ARTHROPLASTY Left 10/23/2013   Procedure: TOTAL HIP ARTHROPLASTY;  Surgeon: Valeria Batman, MD;  Location: [facility] OR;  Service: Orthopedics;  Laterality: Left;    Medications: I have reviewed the patient's current medications. Allergies:  Allergies  Allergen Reactions  . Lisinopril Swelling    Facial swelling  . Xarelto [Rivaroxaban] Other (See Comments)    "Blood came out of my penis"    Family History  Problem Relation Age of Onset  . Heart disease Mother   . Heart Problems Maternal Grandmother   . Other Other        negative for premature CAD   Social History:  reports that he has quit smoking. His smoking use included cigarettes and cigars. He has a 15.00 pack-year smoking history. He has quit using smokeless tobacco. He reports that he does not drink alcohol and does not use drugs.  Review of Systems  Constitutional: Positive for fatigue.  Genitourinary: Positive for hematuria.  All other systems reviewed and are negative.   Physical Exam:  Vital signs in last 24 hours: Temp:  [97.8 F (36.6 C)-98.3 F (36.8 C)] 98.3 F (36.8 C) (12/17 0342) Pulse Rate:  [91-99] 91 (12/17 0342) Resp:  [18-19] 19 (12/17 0342) BP: (94-110)/(59-70) 94/59 (12/17 0342) SpO2:  [100 %] 100 % (12/17 0342) Physical Exam Vitals reviewed.  Constitutional:      Appearance: Normal appearance.  HENT:     Head: Normocephalic and atraumatic.     Nose: Nose normal. No congestion.  Eyes:     Extraocular Movements: Extraocular movements intact.     Pupils: Pupils are equal, round, and reactive to light.  Cardiovascular:     Rate and Rhythm: Normal rate.  Pulmonary:     Effort: Pulmonary effort is normal. No respiratory distress.  Abdominal:     General: Abdomen is flat. There is no distension.  Genitourinary:    Penis: Normal.   Musculoskeletal:        General: No swelling. Normal range of motion.     Cervical back: Normal range of motion. No rigidity.   Skin:    General: Skin is warm.     Coloration: Skin is not jaundiced.  Neurological:     General: No focal deficit present.     Mental Status: He is alert. Mental status is at baseline.  Psychiatric:        Mood and Affect: Mood normal.        Behavior: Behavior normal.     Laboratory Data:  Results for orders placed or performed during the hospital encounter of 11/07/20 (from the past 72 hour(s))  CBC     Status: Abnormal   Collection Time: 11/12/20  5:07 AM  Result Value Ref Range   WBC 7.4 4.0 - 10.5 K/uL   RBC 2.79 (L) 4.22 - 5.81 MIL/uL   Hemoglobin 9.1 (L) 13.0 - 17.0 g/dL   HCT 02.5 (L) 85.2 - 77.8 %   MCV 102.2 (H) 80.0 - 100.0 fL   MCH 32.6  26.0 - 34.0 pg   MCHC 31.9 30.0 - 36.0 g/dL   RDW 40.9 (H) 81.1 - 91.4 %   Platelets 121 (L) 150 - 400 K/uL   nRBC 0.0 0.0 - 0.2 %    Comment: Performed at [facility], [address] [place], [place] [address]  Comprehensive metabolic panel     Status: Abnormal   Collection Time: 11/12/20  5:07 AM  Result Value Ref Range   Sodium 145 135 - 145 mmol/L   Potassium 3.7 3.5 - 5.1 mmol/L   Chloride 119 (H) 98 - 111 mmol/L   CO2 20 (L) 22 - 32 mmol/L   Glucose, Bld 111 (H) 70 - 99 mg/dL    Comment: Glucose reference range applies only to samples taken after fasting for at least 8 hours.   BUN 40 (H) 8 - 23 mg/dL   Creatinine, Ser 6.21 (H) 0.61 - 1.24 mg/dL   Calcium 7.6 (L) 8.9 - 10.3 mg/dL   Total Protein 5.8 (L) 6.5 - 8.1 g/dL   Albumin 2.2 (L) 3.5 - 5.0 g/dL   AST 51 (H) 15 - 41 U/L   ALT 23 0 - 44 U/L   Alkaline Phosphatase 59 38 - 126 U/L   Total Bilirubin 1.4 (H) 0.3 - 1.2 mg/dL   GFR, Estimated 39 (L) >60 mL/min    Comment: (NOTE) Calculated using the CKD-EPI Creatinine Equation (2021)    Anion gap 6 5 - 15    Comment: Performed at [facility], [address], [place], [place] [address]  Urinalysis, Routine w reflex microscopic Urine, Catheterized     Status: Abnormal   Collection Time: 11/13/20  5:29 AM  Result  Value Ref Range   Color, Urine AMBER (A) YELLOW   APPearance CLOUDY (A) CLEAR   Specific Gravity, Urine 1.016 1.005 - 1.030   pH 6.0 5.0 - 8.0   Glucose, UA 50 (A) NEGATIVE mg/dL   Hgb urine dipstick LARGE (A) NEGATIVE   Bilirubin Urine NEGATIVE NEGATIVE   Ketones, ur NEGATIVE NEGATIVE mg/dL   Protein, ur 784 (A) NEGATIVE mg/dL   Nitrite NEGATIVE NEGATIVE   Leukocytes,Ua SMALL (A) NEGATIVE   RBC / HPF >50 (H) 0 - 5 RBC/hpf   WBC, UA >50 (H) 0 - 5 WBC/hpf   Bacteria, UA NONE SEEN NONE SEEN   Squamous Epithelial / LPF 6-10 0 - 5   WBC Clumps PRESENT    Budding Yeast PRESENT    Ca Oxalate Crys, UA PRESENT     Comment: Performed at [facility], [address], [place], [place] [address]  Comprehensive metabolic panel     Status: Abnormal   Collection Time: 11/13/20  5:37 AM  Result Value Ref Range   Sodium 140 135 - 145 mmol/L   Potassium 4.1 3.5 - 5.1 mmol/L   Chloride 116 (H) 98 - 111 mmol/L   CO2 19 (L) 22 - 32 mmol/L   Glucose, Bld 90 70 - 99 mg/dL    Comment: Glucose reference range applies only to samples taken after fasting for at least 8 hours.   BUN 32 (H) 8 - 23 mg/dL   Creatinine, Ser 5.28 (H) 0.61 - 1.24 mg/dL   Calcium 7.9 (L) 8.9 - 10.3 mg/dL   Total Protein 6.0 (L) 6.5 - 8.1 g/dL   Albumin 2.2 (L) 3.5 - 5.0 g/dL   AST 48 (H) 15 - 41 U/L   ALT 23 0 - 44 U/L   Alkaline Phosphatase 61 38 -  126 U/L   Total Bilirubin 1.4 (H) 0.3 - 1.2 mg/dL   GFR, Estimated 44 (L) >60 mL/min    Comment: (NOTE) Calculated using the CKD-EPI Creatinine Equation (2021)    Anion gap 5 5 - 15    Comment: Performed at [facility], [address] [place], [place] [address]  CBC     Status: Abnormal   Collection Time: 11/13/20  5:37 AM  Result Value Ref Range   WBC 7.2 4.0 - 10.5 K/uL   RBC 2.93 (L) 4.22 - 5.81 MIL/uL   Hemoglobin 9.4 (L) 13.0 - 17.0 g/dL   HCT 33.0 (L) 07.6 - 22.6 %   MCV 102.4 (H) 80.0 - 100.0 fL   MCH 32.1 26.0 - 34.0 pg   MCHC 31.3 30.0 - 36.0 g/dL   RDW 33.3  (H) 54.5 - 15.5 %   Platelets 135 (L) 150 - 400 K/uL   nRBC 0.0 0.0 - 0.2 %    Comment: Performed at [facility], [address] [place], [place] [address]  Magnesium     Status: None   Collection Time: 11/13/20  5:37 AM  Result Value Ref Range   Magnesium 1.8 1.7 - 2.4 mg/dL    Comment: Performed at [facility], [address], [place], [place] [address]  Urinalysis, Complete w Microscopic Urine, Catheterized     Status: Abnormal   Collection Time: 11/13/20  5:36 PM  Result Value Ref Range   Color, Urine RED (A) YELLOW    Comment: BIOCHEMICALS MAY BE AFFECTED BY COLOR   APPearance CLOUDY (A) CLEAR   Specific Gravity, Urine  1.005 - 1.030    TEST NOT REPORTED DUE TO COLOR INTERFERENCE OF URINE PIGMENT   pH  5.0 - 8.0    TEST NOT REPORTED DUE TO COLOR INTERFERENCE OF URINE PIGMENT   Glucose, UA (A) NEGATIVE mg/dL    TEST NOT REPORTED DUE TO COLOR INTERFERENCE OF URINE PIGMENT   Hgb urine dipstick (A) NEGATIVE    TEST NOT REPORTED DUE TO COLOR INTERFERENCE OF URINE PIGMENT   Bilirubin Urine (A) NEGATIVE    TEST NOT REPORTED DUE TO COLOR INTERFERENCE OF URINE PIGMENT   Ketones, ur (A) NEGATIVE mg/dL    TEST NOT REPORTED DUE TO COLOR INTERFERENCE OF URINE PIGMENT   Protein, ur (A) NEGATIVE mg/dL    TEST NOT REPORTED DUE TO COLOR INTERFERENCE OF URINE PIGMENT   Nitrite (A) NEGATIVE    TEST NOT REPORTED DUE TO COLOR INTERFERENCE OF URINE PIGMENT   Leukocytes,Ua (A) NEGATIVE    TEST NOT REPORTED DUE TO COLOR INTERFERENCE OF URINE PIGMENT   RBC / HPF >50 (H) 0 - 5 RBC/hpf   WBC, UA >50 (H) 0 - 5 WBC/hpf   Bacteria, UA FEW (A) NONE SEEN   Squamous Epithelial / LPF 0-5 0 - 5    Comment: Performed at [facility], [address], [place], [place] [address]  CBC     Status: Abnormal   Collection Time: 11/14/20  3:57 AM  Result Value Ref Range   WBC 8.3 4.0 - 10.5 K/uL   RBC 2.62 (L) 4.22 - 5.81 MIL/uL   Hemoglobin 8.5 (L) 13.0 - 17.0 g/dL   HCT 81.1 (L) 57.2 - 62.0 %   MCV 105.7 (H)  80.0 - 100.0 fL   MCH 32.4 26.0 - 34.0 pg   MCHC 30.7 30.0 - 36.0 g/dL   RDW 35.5 (H) 97.4 - 16.3 %   Platelets 148 (L) 150 -  400 K/uL   nRBC 0.0 0.0 - 0.2 %    Comment: Performed at [facility], [address] [place], [place] [address]  Basic metabolic panel     Status: Abnormal   Collection Time: 11/14/20  3:57 AM  Result Value Ref Range   Sodium 136 135 - 145 mmol/L   Potassium 4.2 3.5 - 5.1 mmol/L   Chloride 114 (H) 98 - 111 mmol/L   CO2 16 (L) 22 - 32 mmol/L   Glucose, Bld 92 70 - 99 mg/dL    Comment: Glucose reference range applies only to samples taken after fasting for at least 8 hours.   BUN 29 (H) 8 - 23 mg/dL   Creatinine, Ser 4.69 (H) 0.61 - 1.24 mg/dL   Calcium 8.1 (L) 8.9 - 10.3 mg/dL   GFR, Estimated 39 (L) >60 mL/min    Comment: (NOTE) Calculated using the CKD-EPI Creatinine Equation (2021)    Anion gap 6 5 - 15    Comment: Performed at [facility], [address] [place], [place] [address]   Recent Results (from the past 240 hour(s))  Resp Panel by RT-PCR (Flu A&B, Covid) Nasopharyngeal Swab     Status: None   Collection Time: 11/07/20  2:44 PM   Specimen: Nasopharyngeal Swab; Nasopharyngeal(NP) swabs in vial transport medium  Result Value Ref Range Status   SARS Coronavirus 2 by RT PCR NEGATIVE NEGATIVE Final    Comment: (NOTE) SARS-CoV-2 target nucleic acids are NOT DETECTED.  The SARS-CoV-2 RNA is generally detectable in upper respiratory specimens during the acute phase of infection. The lowest concentration of SARS-CoV-2 viral copies this assay can detect is 138 copies/mL. A negative result does not preclude SARS-Cov-2 infection and should not be used as the sole basis for treatment or other patient management decisions. A negative result may occur with  improper specimen collection/handling, submission of specimen other than nasopharyngeal swab, presence of viral mutation(s) within the areas targeted by this assay, and inadequate number of  viral copies(<138 copies/mL). A negative result must be combined with clinical observations, patient history, and epidemiological information. The expected result is Negative.  Fact Sheet for Patients:  BloggerCourse.com  Fact Sheet for Healthcare Providers:  SeriousBroker.it  This test is no t yet approved or cleared by the [place] FDA and  has been authorized for detection and/or diagnosis of SARS-CoV-2 by FDA under an Emergency Use Authorization (EUA). This EUA will remain  in effect (meaning this test can be used) for the duration of the COVID-19 declaration under Section 564(b)(1) of the Act, 21 U.S.C.section 360bbb-3(b)(1), unless the authorization is terminated  or revoked sooner.       Influenza A by PCR NEGATIVE NEGATIVE Final   Influenza B by PCR NEGATIVE NEGATIVE Final    Comment: (NOTE) The Xpert Xpress SARS-CoV-2/FLU/RSV plus assay is intended as an aid in the diagnosis of influenza from Nasopharyngeal swab specimens and should not be used as a sole basis for treatment. Nasal washings and aspirates are unacceptable for Xpert Xpress SARS-CoV-2/FLU/RSV testing.  Fact Sheet for Patients: BloggerCourse.com  Fact Sheet for Healthcare Providers: SeriousBroker.it  This test is not yet approved or cleared by the [place] FDA and has been authorized for detection and/or diagnosis of SARS-CoV-2 by FDA under an Emergency Use Authorization (EUA). This EUA will remain in effect (meaning this test can be used) for the duration of the COVID-19 declaration under Section 564(b)(1) of the Act, 21 U.S.C. section 360bbb-3(b)(1), unless the authorization is terminated  or revoked.  Performed at [facility], [address], [place], [place] [address]   Culture, blood (Routine X 2) w Reflex to ID Panel     Status: None   Collection Time: 11/07/20  3:28 PM   Specimen: BLOOD  RIGHT FOREARM  Result Value Ref Range Status   Specimen Description   Final    BLOOD RIGHT FOREARM BOTTLES DRAWN AEROBIC AND ANAEROBIC   Special Requests Blood Culture adequate volume  Final   Culture   Final    NO GROWTH 6 DAYS Performed at [facility], [address] [place], [place] [address]    Report Status 11/13/2020 FINAL  Final  Culture, blood (Routine X 2) w Reflex to ID Panel     Status: None   Collection Time: 11/07/20  3:59 PM   Specimen: Left Antecubital; Blood  Result Value Ref Range Status   Specimen Description LEFT ANTECUBITAL  Final   Special Requests   Final    BOTTLES DRAWN AEROBIC AND ANAEROBIC Blood Culture adequate volume   Culture   Final    NO GROWTH 6 DAYS Performed at [facility], [address], [place], [place] [address]    Report Status 11/13/2020 FINAL  Final  Urine Culture     Status: Abnormal   Collection Time: 11/07/20  5:00 PM   Specimen: Urine, Clean Catch  Result Value Ref Range Status   Specimen Description   Final    URINE, CLEAN CATCH Performed at [facility], [address], [place], [place] [address]    Special Requests   Final    NONE Performed at [facility], [address], [place], [place] [address]    Culture MULTIPLE SPECIES PRESENT, SUGGEST RECOLLECTION (A)  Final   Report Status 11/09/2020 FINAL  Final  Culture, Urine     Status: Abnormal   Collection Time: 11/08/20  3:51 PM   Specimen: Urine, Clean Catch  Result Value Ref Range Status   Specimen Description   Final    URINE, CLEAN CATCH Performed at [facility], [address] [place], [place] [address]    Special Requests   Final    NONE Performed at [facility], [address], [place], [place] [address]    Culture MULTIPLE SPECIES PRESENT, SUGGEST RECOLLECTION (A)  Final   Report Status 11/10/2020 FINAL  Final  MRSA PCR Screening     Status: Abnormal   Collection Time: 11/08/20  4:00 PM   Specimen: Nasopharyngeal  Result Value Ref Range Status   MRSA by PCR  POSITIVE (A) NEGATIVE Final    Comment:        The GeneXpert MRSA Assay (FDA approved for NASAL specimens only), is one component of a comprehensive MRSA colonization surveillance program. It is not intended to diagnose MRSA infection nor to guide or monitor treatment for MRSA infections. RESULT CALLED TO, READ BACK BY AND VERIFIED WITH: K LOOMIS AT 1836 ON 11/08/2020 BY MOSLEY,J Performed at [facility], [address], [place], [place] [address]    Creatinine: Recent Labs    11/08/20 0950 11/09/20 0421 11/10/20 0841 11/11/20 0404 11/12/20 0507 11/13/20 0537 11/14/20 0357  CREATININE 3.24* 3.10* 2.53* 2.14* 1.71* 1.56* 1.72*   Baseline Creatinine: 3  Impression/Assessment:  86yo with gross hematuria, pyelonephritis, right staghorn calculus  Plan:  1. Gross hematuria: The patients gross hematuria is likely a combination of infection and his large staghorn calculus. . His urine is draining well and the hematuria is minimal currently. He does  not require any further intervention at this time. The patient was urinating well prior to admission and can have a voiding trial prior to hospital discharge.  2. Right staghorn calculus with pyelonephritis: The patient is currently improving with IV antibiotics and does not require ureteral stent versus nephrostomy tube placement at this time. Please continue broad spectrum antibiotics for 2 weeks. If the patient develops fevers he will require drainage of his right collecting system with either a ureteral stent or nephrostomy tube. He is not a candidate for removal of staghorn calculus due to his multiple medical comorbidites  Ryan Macdonald 11/14/2020, 4:09 PM

## 2020-11-14 NOTE — Progress Notes (Signed)
Subjective: Minimally conversational. States he's eating more, had a loose bowel movement ("large") sometime since yesterday. No blood. Nursing denies any report of diarrhea. States he is not eating well (minimal intake at breakfast) but will offer him chocolate ensure which he apparently enjoys. Patient denies abdominal pain, N/V. No other GI complaints.  Objective: Vital signs in last 24 hours: Temp:  [97.8 F (36.6 C)-98.4 F (36.9 C)] 98.3 F (36.8 C) (12/17 0342) Pulse Rate:  [75-99] 91 (12/17 0342) Resp:  [16-19] 19 (12/17 0342) BP: (94-110)/(59-70) 94/59 (12/17 0342) SpO2:  [100 %] 100 % (12/17 0342) Last BM Date: 11/12/20 General:   Alert, pleasant. Minimally conversational Head:  Normocephalic and atraumatic. Eyes:  No icterus, sclera clear. Conjuctiva pink.  Mouth: Missing many teeth, no dentures in place. Heart:  S1, S2 present, no murmurs noted.  Lungs: Clear to auscultation bilaterally, without wheezing, rales, or rhonchi.  Abdomen:  Bowel sounds present, soft, non-tender, non-distended. No HSM or hernias noted. No rebound or guarding. No masses appreciated  Msk:  Symmetrical without gross deformities. Extremities:  Without clubbing or edema. Psych:  Alert and cooperative. Normal mood and affect.  Intake/Output from previous day: 12/16 0701 - 12/17 0700 In: 1100 [P.O.:50; I.V.:950; IV Piggyback:100] Out: 350 [Urine:350] Intake/Output this shift: No intake/output data recorded.  Lab Results: Recent Labs    11/12/20 0507 11/13/20 0537 11/14/20 0357  WBC 7.4 7.2 8.3  HGB 9.1* 9.4* 8.5*  HCT 28.5* 30.0* 27.7*  PLT 121* 135* 148*   BMET Recent Labs    11/12/20 0507 11/13/20 0537 11/14/20 0357  NA 145 140 136  K 3.7 4.1 4.2  CL 119* 116* 114*  CO2 20* 19* 16*  GLUCOSE 111* 90 92  BUN 40* 32* 29*  CREATININE 1.71* 1.56* 1.72*  CALCIUM 7.6* 7.9* 8.1*   LFT Recent Labs    11/12/20 0507 11/13/20 0537  PROT 5.8* 6.0*  ALBUMIN 2.2* 2.2*  AST 51*  48*  ALT 23 23  ALKPHOS 59 61  BILITOT 1.4* 1.4*   PT/INR No results for input(s): LABPROT, INR in the last 72 hours. Hepatitis Panel No results for input(s): HEPBSAG, HCVAB, HEPAIGM, HEPBIGM in the last 72 hours.   Studies/Results: No results found.  Assessment: 86 y/o with multiple comorbidities including A. fib on Eliquis, CKD, chronic heart failure with AICD and EF of 20 to 25% (September 2021) admitted with sepsis in the setting of UTI, found to have melena on presentation and acute blood loss anemia.  Acute blood loss anemia: Hemoglobin a few months ago in the 8/9 range, presented with hemoglobin of 6.8 this admission in the setting of Eliquis. 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 Severe gastritis without bleeding, normal examined duodenum. Exam ended abruptly due to patient developing bradycardia with arrhythmia (per endoscopist Dr. Marletta Lor). No specimens collected. Patient has completed PPI infusion. Currently on IV Protonix 40 mg twice daily and Carafate 4 times daily. Has received a total of 3 units of packed red blood cells this admission. Yesterday his hemoglobin was stable at 9.4, but dropped a gram to 8.5 this morning, although gross blood was noted in his foley bag yesterday and some blood this morning. Nephrology states he has a large right kdiney stone (about 1.5 inches) with kidney infection. No GI bleed since yesterday per staff. Eliquis resumed evening of 11/11/20. H.pylori  serologies positive and will need to be treated at time of discharge.   Poor oral intake: Appreciate SLP input/assistance. Seen by speech therapy 12/15. Patient reports poor appetite. Likely multifactorial. Given appearance of esophagus, would like do best on soft diet. Hopefully with management of his esophagitis and H.pylori gastritis will  will see some improvement. Has dentures at rehab and home, but hasn't been using them and recommended they be retrieved and used to help intake. Likes Chocolate Ensure (notified nursing).  Colitis on CT: Involving mid to distal sigmoid colon with association of significant stool burden, felt to be nonspecific finding. No diarrhea on admission. He has had a couple of loose stools which may be secondary to antibiotic use. Continue to monitor. If significant diarrhea, consider stool studies. Staff denies any loose stool.  Elevated LFTs: Continue to improve. Elevated more significantly back in [place] of this year. However mildly elevated AST noted in April 2021 along with mildly elevated AST and total bilirubin in September 2021. Yesterday total bilirubin 2.0, indirect bilirubin 1.5. Prior evaluation for acute hepatitis panel negative. Ferritin within normal limits. No cirrhosis on prior imaging (ultrasound and CT). CT this admission with normal-appearing pancreas, liver, biliary tree, gallbladder. No abdominal pain or vomiting. Continue to monitor. Hasn't been checked today.  Thrombocytopenia:Initially normal platelet count of 266 on admission. Down to 121 yesterday but increased over the past 48 hours and 148 today. Continue to monitor.   Plan: 1. Aggressive management of GERD with bid PPI and Carafate (as previously recommended) 2. Offer patient what he likes to promote adequate nutrition 3. Attempt use of dentures again 4. Hold off on further endoscopic evaluation 5. Complete meal assistance 6. Treat H. Pylori at discharge: pantoprazole 40mg  BID, amoxicillin 1000mg  BID, clarithromycin 500mg  BID all for 14 days. HOLD crestor while on treatment. 7. Monitor for loose stools, consider CDiff/GI Path if develops 8. Monitor for any obvious GI bleed 9. Check CMP in the morning for LFT trend    LOS: 7 days    11/14/2020, 8:33 AM

## 2020-11-14 NOTE — TOC Progression Note (Signed)
Transition of Care [facility]) - Progression Note    Patient Details  Name: Ryan Macdonald MRN: [id number] Date of Birth: Oct 30, 1934  Transition of Care [facility]) CM/SW Contact  Elliot Gault, LCSW Phone Number: 11/14/2020, 10:37 AM  Clinical Narrative:     TOC following. Pt does not have any new bed offers for SNF rehab at dc. Spoke with pt's wife, [name], to update. [name] states that she is in agreement with return to [facility] [employer] if no other placement is found. She did state that she would let us know if she decides to take him home instead.   MD anticipating earliest dc of Monday next week. TOC will follow.  Expected Discharge Plan: Skilled Nursing Facility Barriers to Discharge: Continued Medical Work up  Expected Discharge Plan and Services Expected Discharge Plan: Skilled Nursing Facility In-house Referral: Clinical Social Work     Living arrangements for the past 2 months: Skilled Nursing Facility                                       Social Determinants of Health (SDOH) Interventions    Readmission Risk Interventions Readmission Risk Prevention Plan 11/10/2020 09/25/2020  Transportation Screening Complete Complete  HRI or Home Care Consult - Complete  Social Work Consult for Recovery Care Planning/Counseling - Complete  Palliative Care Screening - Not Applicable  Medication Review Oceanographer) Complete Complete  HRI or Home Care Consult Complete -  SW Recovery Care/Counseling Consult Complete -  Palliative Care Screening Complete -  Skilled Nursing Facility Complete -  Some recent data might be hidden

## 2020-11-15 DIAGNOSIS — K2970 Gastritis, unspecified, without bleeding: Secondary | ICD-10-CM

## 2020-11-15 DIAGNOSIS — B9681 Helicobacter pylori [H. pylori] as the cause of diseases classified elsewhere: Secondary | ICD-10-CM

## 2020-11-15 LAB — CBC
HCT: 28.6 % — ABNORMAL LOW (ref 39.0–52.0)
Hemoglobin: 9.2 g/dL — ABNORMAL LOW (ref 13.0–17.0)
MCH: 32.1 pg (ref 26.0–34.0)
MCHC: 32.2 g/dL (ref 30.0–36.0)
MCV: 99.7 fL (ref 80.0–100.0)
Platelets: 177 10*3/uL (ref 150–400)
RBC: 2.87 MIL/uL — ABNORMAL LOW (ref 4.22–5.81)
RDW: 20.5 % — ABNORMAL HIGH (ref 11.5–15.5)
WBC: 8.8 10*3/uL (ref 4.0–10.5)
nRBC: 0 % (ref 0.0–0.2)

## 2020-11-15 LAB — COMPREHENSIVE METABOLIC PANEL
ALT: 20 U/L (ref 0–44)
AST: 43 U/L — ABNORMAL HIGH (ref 15–41)
Albumin: 2.1 g/dL — ABNORMAL LOW (ref 3.5–5.0)
Alkaline Phosphatase: 66 U/L (ref 38–126)
Anion gap: 6 (ref 5–15)
BUN: 26 mg/dL — ABNORMAL HIGH (ref 8–23)
CO2: 17 mmol/L — ABNORMAL LOW (ref 22–32)
Calcium: 8.4 mg/dL — ABNORMAL LOW (ref 8.9–10.3)
Chloride: 114 mmol/L — ABNORMAL HIGH (ref 98–111)
Creatinine, Ser: 1.58 mg/dL — ABNORMAL HIGH (ref 0.61–1.24)
GFR, Estimated: 43 mL/min — ABNORMAL LOW (ref >60)
Glucose, Bld: 71 mg/dL (ref 70–99)
Potassium: 4.5 mmol/L (ref 3.5–5.1)
Sodium: 137 mmol/L (ref 135–145)
Total Bilirubin: 1.5 mg/dL — ABNORMAL HIGH (ref 0.3–1.2)
Total Protein: 6 g/dL — ABNORMAL LOW (ref 6.5–8.1)

## 2020-11-15 LAB — MAGNESIUM: Magnesium: 1.6 mg/dL — ABNORMAL LOW (ref 1.7–2.4)

## 2020-11-15 MED ORDER — PANTOPRAZOLE SODIUM 40 MG PO TBEC
40.0000 mg | DELAYED_RELEASE_TABLET | Freq: Two times a day (BID) | ORAL | 1 refills | Status: AC
Start: 2020-11-15 — End: ?

## 2020-11-15 MED ORDER — CEFDINIR 300 MG PO CAPS: 300.0000 mg | ORAL_CAPSULE | Freq: Two times a day (BID) | ORAL | 0 refills | Status: AC

## 2020-11-15 MED ORDER — CLARITHROMYCIN 500 MG PO TABS: 500.0000 mg | ORAL_TABLET | Freq: Two times a day (BID) | ORAL | 0 refills | Status: AC

## 2020-11-15 MED ORDER — ASPIRIN EC 81 MG PO TBEC: 81.0000 mg | DELAYED_RELEASE_TABLET | Freq: Every day | ORAL | 11 refills | Status: AC

## 2020-11-15 MED ORDER — AMOXICILLIN 500 MG PO TABS: 1000.0000 mg | ORAL_TABLET | Freq: Two times a day (BID) | ORAL | 0 refills | Status: AC

## 2020-11-15 MED ORDER — METRONIDAZOLE 500 MG PO TABS
500.0000 mg | ORAL_TABLET | Freq: Three times a day (TID) | ORAL | 0 refills | Status: AC
Start: 2020-11-15 — End: ?

## 2020-11-15 NOTE — TOC Transition Note (Signed)
Transition of Care [facility]) - CM/SW Discharge Note   Patient Details  Name: Ryan Macdonald MRN: [id number] Date of Birth: [birth year]  Transition of Care [facility]) CM/SW Contact:  Barry Brunner, LCSW Phone Number: 11/15/2020, 7:35 PM   Clinical Narrative:    CSW notified of patient's readiness for discharge and families request for home hospice. CSW contacted patient's spouse to identified preferred Hospice provider. Patient's wife agreeable to [facility] providers and requested hospital bed. CSW placed referral to [facility]. [facility] agreeable to take patient and provide hospital bed through [employer]. [facility] [place] also reported that patients Elquis will not be covered through hospice. CSW notified by patient's spouse that patient will not continue Eliquis upon discharge per MD. CSW notified [place].  CSW contacted EMS and completed med necessity form. TOC signing off.  Addendum  After patient had discharged with EMS, [facility] contacted CSW to inquire if patient's defibrillator had been deactivated. MD reported that it had not been. CSW consulted [facility] who reported that it could be deactivated by the company in the morning if they were not able to do it this evening. CSW notified [place]. At approximately 4:20 pm Corrie Dandy an reported that the patient's bed would be delivered within the hour according to [employer]. CSW called for EMS equating for amount of time it would take for EMS to arrive at the hospital and transport patient to home address in [place]. CSW verified with patients wife that bed had been delivered, right after EMS arrived with patient. Patient's wife reported that EMS was able to move patient to hospital bed. TOC signing off.   Final next level of care: Home w Hospice Care Barriers to Discharge: Barriers Resolved   Patient Goals and CMS Choice Patient states their goals for this hospitalization and ongoing recovery are:: Return home with hospice CMS  Medicare.gov Compare Post Acute Care list provided to:: Patient Choice offered to / list presented to : [facility] [place]  Discharge Placement                Patient to be transferred to facility by: [facility] Name of family member notified: Gwynneth Macleod Patient and family notified of of transfer: 11/15/20  Discharge Plan and Services In-house Referral: Clinical Social Work              DME Arranged: Hospital bed DME Agency: Other - Comment,[facility] ([facility]) Date DME Agency Contacted: 11/15/20 Time DME Agency Contacted: [id number] Representative spoke with at DME Agency: Corrie Dandy with [facility] hospice placed order with [employer]            Social Determinants of Health (SDOH) Interventions     Readmission Risk Interventions Readmission Risk Prevention Plan 11/10/2020 09/25/2020  Transportation Screening Complete Complete  HRI or Home Care Consult - Complete  Social Work Consult for Recovery Care Planning/Counseling - Complete  Palliative Care Screening - Not Applicable  Medication Review Oceanographer) Complete Complete  PCP or Specialist appointment within 3-5 days of discharge Complete -  HRI or Home Care Consult Complete -  SW Recovery Care/Counseling Consult Complete -  Palliative Care Screening Complete -  Skilled Nursing Facility Complete -  Some recent data might be hidden

## 2020-11-15 NOTE — Discharge Summary (Signed)
Physician Discharge Summary  Ryan Macdonald ZOX:[id number] DOB: November 25, 1934 DOA: 11/07/2020  PCP: Sherol Dade, DO  Admit date: 11/07/2020 Discharge date: 11/15/2020  Admitted From: SNF Disposition:  Home with Hospice    Home Health: Home with Hospice services Equipment/Devices: Hospital Bed  Discharge Condition: Stable CODE STATUS: DNR Diet recommendation: Regular   Brief/Interim Summary: 86 y.o.male,malewith medical history significant ofHFrEF (EF 20 to 25%, AICD in place), paroxysmal A. fib on amiodarone and Eliquis, hypertension, chronic kidney disease 3B, hyperlipidemia on a statin, gout, hypothyroidism, and recent sepsis related to a UTIpatient with multiple recent hospitalizations; He wasaltered, could notprovide any history, it was obtained from ED staff and wife by phone. -Patient was sent by [facility] for increased weakness, lethargy, and decreased oral intake, in ED patient was noted to be unresponsive at all, medicated provide any complaints, patient was noted to to have melena while in ED, Hemoccult came back positive, as well wife mentioned patient with poor oral intake recently, and there was discussion with facility staff about arranging for peg tube during his poor oral intake. -in EDevaluation was significant for melena, Hemoccult positive stool, with hemoglobin of 6.8, as well of sodium of 157, worsening creatinine and BUN, creatinine 3.76, BUN of 92, BNP elevated at 306, mildly elevated AST at 74, CT abdomen and pelvis was significant for colitis, as well there is some evidence of right pleural effusion, and UA was strongly positive, as well patient was noted to have soft blood pressure, [facility] hospitalist consulted to admit.  Patient was transfused 3 units PRBCs and Hgb remained stable there after.  GI was consulted and EGD on 12/13 showed grade C erosive esophagitis.  Because patient had significant bradycardia with cardiac dysrhythmia  prompting emergent completion of EGD and multiple comorbidities, GI advised that further endoscopic evaluation is no warranted.  He is not a good candidate for colonoscopy.  He developed hematuria due to his staghorn calculus on R-kidney.  Urology was consulted and did not recommend any operative intervention at this time as long as pt is stable and improving.  After multiple goals of care discussions with pt's wife, plan was to d/c home with hospice.  Due to GIB and recurrent hematuria, decision was made to discontinue apixaban and start ASA.  Discharge Diagnoses:   Acute blood loss anemia -Secondary to GI bleeding and hematuria -Patient noted to have melena, FOBT positive -Hemoglobin 6.8 on admission -He has received a total of 3 units of PRBCs thus far with follow-up hemoglobinhas been stable -Continue to follow hemoglobin and transfuse as needed -12/13/21EGDLA grade Cerosive esophagitis without bleedingsevere gastritis without bleeding, normal examined duodenum;  -PPI infusion completed 12/13 -continue on PPI twice daily as well as Carafate -H.Pylori positive-->start tx after done for pyelonephritis/colitis -instructed spouse to start amoxil, claritho (12/25) when done with cefdinir and metronidazole  Acute kidney injury on chronic kidney disease stage IIIb -Likely related to volume depletion andhemodynamic changes -No hydronephrosis reported on imaging -Continue IV fluids, monitor urine output -baseline creatinine 1.4-1.7 -serum creatinin 1.58 at time of d/c  Hypotension -Suspect this is related to hypovolemia secondary to severe dehydration as well as GI bleeding -Continue to hold antihypertensives -Continue volume resuscitation -He also received albumin infusions since he is severely hypoalbuminemic and would likely third space IV fluids -overall improved with SBP in 100s  Lactic acidosis -present on admission -lactic acid peaked 3.9 -Related to dehydration and  hypovolemia -Continue IV fluid resuscitation-->improved  Paroxysmal atrial fibrillation -Restart amiodarone-->now spouse states he  no longer takes-->d/c -Restart Eliquis per GI initially, but stopped again due to hematuria  Hypernatremia -Related to severe dehydration and free water deficit -Continue hypotonic fluids>>improved  Pyelonephritis/Hematuria -Urinalysis indicates possible infection -Based on sepsis criteria on admission, sepsis has been ruled out -He is on intravenous antibiotics -Urine culture shows multiple species -11/12/20 eve--developed hematuria again--suspect this is related to his staghorn calculus -12/16--repeat UA and urine culture--culture < 10K org -stop apixaban for now -discussed with urology, Dr. Elvera Maria consult-->continue nonoperative management -discussed with spouse about removing foley prior to d/c.  Since goal of care now is more on his comfort, she preferred to leave it in at the time of d/c  colitis -Noted on CT abdomen -Continuedon ceftriaxoneand Flagyl -GI following -no further hematochezia, melena  Acute metabolic encephalopathy -Suspect this is related to severe dehydration, hypernatremia -Overall mental status appears to be slowly improved. -back to baseline per spouse at time of d/c  Chronic systolic congestive heart failure/nonischemic cardiomyopathy -Ejection fraction of 20 to 25%, AICD in place -Holding diuretics in the setting of hypotension/renal failure -Continue to monitor volume status  Severe protein calorie malnutrition -P.o. intake has been poor -Nutrition consult -It is reported that patient was taking nectar thick liquids at the nursing facility -He will be started on a pured diet and nectar thick liquids until he can be seen by speech therapy -request repeat speech eval--hopefully to liberalize diet-->D1 diet with thin liquids  Hypothyroidism -Continue on  Synthroid  Hypokalemia -Replace -check mag--1.8  Goals of care -Discussed advanced directives with patient's wife [name] Singletonagain on 12/17--now interested in going home with hospice -She has discussed CODE STATUS with patient's children and all are in agreement for DNR status -Patient seen by palliative care to address further goals of care -Decision made against feeding tube -After further GOC discussions with spouse, she expressed she wanted to take him home with hospice care -after discussion with spouse, she wishes to keep foley catheter for now  Discharge Instructions   Allergies as of 11/15/2020      Reactions   Lisinopril Swelling   Facial swelling   Xarelto [rivaroxaban] Other (See Comments)   "Blood came out of my penis"      Medication List    STOP taking these medications   amiodarone 100 MG tablet Commonly known as: PACERONE   apixaban 2.5 MG Tabs tablet Commonly known as: ELIQUIS   benzonatate 100 MG capsule Commonly known as: TESSALON   cyanocobalamin 1000 MCG tablet   finasteride 5 MG tablet Commonly known as: PROSCAR   HYDROcodone-acetaminophen 5-325 MG tablet Commonly known as: NORCO/VICODIN   multivitamin with minerals Tabs tablet   rosuvastatin 10 MG tablet Commonly known as: CRESTOR   spironolactone 25 MG tablet Commonly known as: ALDACTONE     TAKE these medications   acetaminophen 325 MG tablet Commonly known as: TYLENOL Take 650 mg by mouth every 6 (six) hours as needed.   amoxicillin 500 MG tablet Commonly known as: AMOXIL Take 2 tablets (1,000 mg total) by mouth 2 (two) times daily. Start taking on: November 22, 2020   aspirin EC 81 MG tablet Take 1 tablet (81 mg total) by mouth daily. Swallow whole.   cefdinir 300 MG capsule Commonly known as: OMNICEF Take 1 capsule (300 mg total) by mouth 2 (two) times daily.   clarithromycin 500 MG tablet Commonly known as: BIAXIN Take 1 tablet (500 mg total) by mouth 2  (two) times daily. Start taking on: November 22, 2020  diclofenac Sodium 1 % Gel Commonly known as: VOLTAREN Apply 2 g topically 4 (four) times daily.   dronabinol 5 MG capsule Commonly known as: MARINOL Take 1 capsule (5 mg total) by mouth 2 (two) times daily before lunch and supper.   feeding supplement Liqd Take 237 mLs by mouth 2 (two) times daily between meals.   levothyroxine 125 MCG tablet Commonly known as: SYNTHROID Take 1 tablet (125 mcg total) by mouth daily before breakfast.   lidocaine 5 % Commonly known as: LIDODERM Place 1 patch onto the skin daily. Remove & Discard patch within 12 hours or as directed by MD   magnesium oxide 400 MG tablet Commonly known as: MAG-OX Take 400 mg by mouth daily.   metroNIDAZOLE 500 MG tablet Commonly known as: FLAGYL Take 1 tablet (500 mg total) by mouth every 8 (eight) hours.   ondansetron 4 MG tablet Commonly known as: ZOFRAN Take 1 tablet (4 mg total) by mouth every 6 (six) hours as needed for nausea.   pantoprazole 40 MG tablet Commonly known as: PROTONIX Take 1 tablet (40 mg total) by mouth 2 (two) times daily before a meal.   POLY-IRON 150 PO Take 1 capsule by mouth in the morning.   polyethylene glycol 17 g packet Commonly known as: MIRALAX / GLYCOLAX Take 17 g by mouth 2 (two) times daily.   saccharomyces boulardii 250 MG capsule Commonly known as: FLORASTOR Take 1 capsule (250 mg total) by mouth 2 (two) times daily.   sertraline 25 MG tablet Commonly known as: ZOLOFT Take 25 mg by mouth daily.   traZODone 50 MG tablet Commonly known as: DESYREL Take 0.5 tablets (25 mg total) by mouth at bedtime as needed for sleep.   Vitamin C 500 MG Caps Take 500 mg by mouth in the morning and at bedtime.   Zinc 50 MG Caps Take 50 mg by mouth daily.       Allergies  Allergen Reactions  . Lisinopril Swelling    Facial swelling  . Xarelto [Rivaroxaban] Other (See Comments)    "Blood came out of my penis"     Consultations:  Palliative  Urology  GI   Procedures/Studies: CT ABDOMEN PELVIS WO CONTRAST  Result Date: 11/07/2020 CLINICAL DATA:  Melena with weakness and lethargy. EXAM: CT ABDOMEN AND PELVIS WITHOUT CONTRAST TECHNIQUE: Multidetector CT imaging of the abdomen and pelvis was performed following the standard protocol without IV contrast. COMPARISON:  September 26, 2020 FINDINGS: Lower chest: Mild atelectasis and/or infiltrate is seen within the right lung base. There is a moderate sized right pleural effusion. A small left pleural effusion is also noted. Hepatobiliary: No focal liver abnormality is seen. No gallstones, gallbladder wall thickening, or biliary dilatation. Pancreas: Unremarkable. No pancreatic ductal dilatation or surrounding inflammatory changes. Spleen: Normal in size without focal abnormality. Adrenals/Urinary Tract: Adrenal glands are unremarkable. The left kidney is normal in size. Moderate severity diffuse cortical thinning with renal atrophy is seen on the right. A stable 1.6 cm diameter cyst is noted within the posterior aspect of the mid left kidney. A 2.7 mm staghorn calculus is seen on the right with additional innumerable subcentimeter non-obstructing renal stones present throughout the right kidney. A moderate amount of air is seen within the calices of the right kidney and proximal right ureter. This represents a new finding when compared to the prior exam. No perinephric or periureteral inflammatory fat stranding is seen. A mild-to-moderate amount of air is seen within the lumen of a urinary  bladder diverticulum. A small amount of air is seen within this region on the prior study. Stomach/Bowel: Stomach is within normal limits. The appendix is not clearly identified. No evidence of bowel dilatation. Mild to moderate severity thickening of the mid to distal sigmoid colon is seen (axial CT images 64 through 74, CT series number 2). Vascular/Lymphatic: Aortic  atherosclerosis. No enlarged abdominal or pelvic lymph nodes. Reproductive: The prostate gland is poorly visualized secondary to overlying streak artifact. Other: A stable posterolateral right abdominal wall hernia is seen. No abdominopelvic ascites. Musculoskeletal: A chronic eleventh right rib fracture is seen. A left hip replacement is seen with associated streak artifact and subsequently limited evaluation of the adjacent osseous and soft tissue structures. Multilevel degenerative changes seen throughout the lumbar spine. IMPRESSION: 1. Mild to moderate severity colitis involving the mid to distal sigmoid colon. Follow-up to resolution is recommended to exclude the presence of an underlying neoplasm. 2. Moderate amount of calyceal air within the right kidney and proximal right ureter which the secondary to recent instrumentation. The presence of an underlying infectious process cannot be excluded. Correlation with urinalysis is recommended. 3. Bilateral pleural effusions, right greater than left. 4. Atrophic right kidney with extensive right-sided nephrolithiasis. 5. Left hip replacement. 6. Aortic atherosclerosis. Aortic Atherosclerosis (ICD10-I70.0). Electronically Signed   By: Aram Candela M.D.   On: 11/07/2020 17:25   DG Chest 1 View  Result Date: 11/07/2020 CLINICAL DATA:  Altered mental status. EXAM: CHEST  1 VIEW COMPARISON:  September 20, 2020 FINDINGS: A single lead ventricular pacer is in place. There is no evidence of acute infiltrate. A small, stable right pleural effusion is noted. No pneumothorax is seen. The heart size and mediastinal contours are within normal limits. Degenerative changes are present throughout the thoracic spine. IMPRESSION: 1. Small, stable right pleural effusion. Electronically Signed   By: Aram Candela M.D.   On: 11/07/2020 15:34   CT Head Wo Contrast  Result Date: 11/07/2020 CLINICAL DATA:  Weakness and lethargy. EXAM: CT HEAD WITHOUT CONTRAST TECHNIQUE:  Contiguous axial images were obtained from the base of the skull through the vertex without intravenous contrast. COMPARISON:  September 20, 2020 FINDINGS: Brain: There is mild cerebral atrophy with widening of the extra-axial spaces and ventricular dilatation. There are areas of decreased attenuation within the white matter tracts of the supratentorial brain, consistent with microvascular disease changes. Vascular: No hyperdense vessel or unexpected calcification. Skull: Normal. Negative for fracture or focal lesion. Sinuses/Orbits: No acute finding. Other: None. IMPRESSION: 1. Generalized cerebral atrophy. 2. No acute intracranial abnormality. Electronically Signed   By: Aram Candela M.D.   On: 11/07/2020 15:59        Discharge Exam: Vitals:   11/14/20 2001 11/15/20 0417  BP: 95/60 101/65  Pulse: 98 92  Resp: 19 18  Temp: 98.2 F (36.8 C) 98.1 F (36.7 C)  SpO2: 100% 98%   Vitals:   11/14/20 0342 11/14/20 1500 11/14/20 2001 11/15/20 0417  BP: (!) 94/59 (!) 155/86 95/60 101/65  Pulse: 91 77 98 92  Resp: 19 16 19 18   Temp: 98.3 F (36.8 C) 98.4 F (36.9 C) 98.2 F (36.8 C) 98.1 F (36.7 C)  TempSrc:  Oral    SpO2: 100% 100% 100% 98%  Weight:      Height:        General: Pt is alert, awake, not in acute distress Cardiovascular: RRR, S1/S2 +, no rubs, no gallops Respiratory: bibasilar rales. No wheeze Abdominal: Soft, NT, ND, bowel  sounds + Extremities: no edema, no cyanosis   The results of significant diagnostics from this hospitalization (including imaging, microbiology, ancillary and laboratory) are listed below for reference.    Significant Diagnostic Studies: CT ABDOMEN PELVIS WO CONTRAST  Result Date: 11/07/2020 CLINICAL DATA:  Melena with weakness and lethargy. EXAM: CT ABDOMEN AND PELVIS WITHOUT CONTRAST TECHNIQUE: Multidetector CT imaging of the abdomen and pelvis was performed following the standard protocol without IV contrast. COMPARISON:  September 26, 2020  FINDINGS: Lower chest: Mild atelectasis and/or infiltrate is seen within the right lung base. There is a moderate sized right pleural effusion. A small left pleural effusion is also noted. Hepatobiliary: No focal liver abnormality is seen. No gallstones, gallbladder wall thickening, or biliary dilatation. Pancreas: Unremarkable. No pancreatic ductal dilatation or surrounding inflammatory changes. Spleen: Normal in size without focal abnormality. Adrenals/Urinary Tract: Adrenal glands are unremarkable. The left kidney is normal in size. Moderate severity diffuse cortical thinning with renal atrophy is seen on the right. A stable 1.6 cm diameter cyst is noted within the posterior aspect of the mid left kidney. A 2.7 mm staghorn calculus is seen on the right with additional innumerable subcentimeter non-obstructing renal stones present throughout the right kidney. A moderate amount of air is seen within the calices of the right kidney and proximal right ureter. This represents a new finding when compared to the prior exam. No perinephric or periureteral inflammatory fat stranding is seen. A mild-to-moderate amount of air is seen within the lumen of a urinary bladder diverticulum. A small amount of air is seen within this region on the prior study. Stomach/Bowel: Stomach is within normal limits. The appendix is not clearly identified. No evidence of bowel dilatation. Mild to moderate severity thickening of the mid to distal sigmoid colon is seen (axial CT images 64 through 74, CT series number 2). Vascular/Lymphatic: Aortic atherosclerosis. No enlarged abdominal or pelvic lymph nodes. Reproductive: The prostate gland is poorly visualized secondary to overlying streak artifact. Other: A stable posterolateral right abdominal wall hernia is seen. No abdominopelvic ascites. Musculoskeletal: A chronic eleventh right rib fracture is seen. A left hip replacement is seen with associated streak artifact and subsequently limited  evaluation of the adjacent osseous and soft tissue structures. Multilevel degenerative changes seen throughout the lumbar spine. IMPRESSION: 1. Mild to moderate severity colitis involving the mid to distal sigmoid colon. Follow-up to resolution is recommended to exclude the presence of an underlying neoplasm. 2. Moderate amount of calyceal air within the right kidney and proximal right ureter which the secondary to recent instrumentation. The presence of an underlying infectious process cannot be excluded. Correlation with urinalysis is recommended. 3. Bilateral pleural effusions, right greater than left. 4. Atrophic right kidney with extensive right-sided nephrolithiasis. 5. Left hip replacement. 6. Aortic atherosclerosis. Aortic Atherosclerosis (ICD10-I70.0). Electronically Signed   By: Aram Candela M.D.   On: 11/07/2020 17:25   DG Chest 1 View  Result Date: 11/07/2020 CLINICAL DATA:  Altered mental status. EXAM: CHEST  1 VIEW COMPARISON:  September 20, 2020 FINDINGS: A single lead ventricular pacer is in place. There is no evidence of acute infiltrate. A small, stable right pleural effusion is noted. No pneumothorax is seen. The heart size and mediastinal contours are within normal limits. Degenerative changes are present throughout the thoracic spine. IMPRESSION: 1. Small, stable right pleural effusion. Electronically Signed   By: Aram Candela M.D.   On: 11/07/2020 15:34   CT Head Wo Contrast  Result Date: 11/07/2020 CLINICAL DATA:  Weakness and lethargy. EXAM: CT HEAD WITHOUT CONTRAST TECHNIQUE: Contiguous axial images were obtained from the base of the skull through the vertex without intravenous contrast. COMPARISON:  September 20, 2020 FINDINGS: Brain: There is mild cerebral atrophy with widening of the extra-axial spaces and ventricular dilatation. There are areas of decreased attenuation within the white matter tracts of the supratentorial brain, consistent with microvascular disease  changes. Vascular: No hyperdense vessel or unexpected calcification. Skull: Normal. Negative for fracture or focal lesion. Sinuses/Orbits: No acute finding. Other: None. IMPRESSION: 1. Generalized cerebral atrophy. 2. No acute intracranial abnormality. Electronically Signed   By: Aram Candela M.D.   On: 11/07/2020 15:59     Microbiology: Recent Results (from the past 240 hour(s))  Resp Panel by RT-PCR (Flu A&B, Covid) Nasopharyngeal Swab     Status: None   Collection Time: 11/07/20  2:44 PM   Specimen: Nasopharyngeal Swab; Nasopharyngeal(NP) swabs in vial transport medium  Result Value Ref Range Status   SARS Coronavirus 2 by RT PCR NEGATIVE NEGATIVE Final    Comment: (NOTE) SARS-CoV-2 target nucleic acids are NOT DETECTED.  The SARS-CoV-2 RNA is generally detectable in upper respiratory specimens during the acute phase of infection. The lowest concentration of SARS-CoV-2 viral copies this assay can detect is 138 copies/mL. A negative result does not preclude SARS-Cov-2 infection and should not be used as the sole basis for treatment or other patient management decisions. A negative result may occur with  improper specimen collection/handling, submission of specimen other than nasopharyngeal swab, presence of viral mutation(s) within the areas targeted by this assay, and inadequate number of viral copies(<138 copies/mL). A negative result must be combined with clinical observations, patient history, and epidemiological information. The expected result is Negative.  Fact Sheet for Patients:  BloggerCourse.com  Fact Sheet for Healthcare Providers:  SeriousBroker.it  This test is no t yet approved or cleared by the [place] FDA and  has been authorized for detection and/or diagnosis of SARS-CoV-2 by FDA under an Emergency Use Authorization (EUA). This EUA will remain  in effect (meaning this test can be used) for the duration  of the COVID-19 declaration under Section 564(b)(1) of the Act, 21 U.S.C.section 360bbb-3(b)(1), unless the authorization is terminated  or revoked sooner.       Influenza A by PCR NEGATIVE NEGATIVE Final   Influenza B by PCR NEGATIVE NEGATIVE Final    Comment: (NOTE) The Xpert Xpress SARS-CoV-2/FLU/RSV plus assay is intended as an aid in the diagnosis of influenza from Nasopharyngeal swab specimens and should not be used as a sole basis for treatment. Nasal washings and aspirates are unacceptable for Xpert Xpress SARS-CoV-2/FLU/RSV testing.  Fact Sheet for Patients: BloggerCourse.com  Fact Sheet for Healthcare Providers: SeriousBroker.it  This test is not yet approved or cleared by the [place] FDA and has been authorized for detection and/or diagnosis of SARS-CoV-2 by FDA under an Emergency Use Authorization (EUA). This EUA will remain in effect (meaning this test can be used) for the duration of the COVID-19 declaration under Section 564(b)(1) of the Act, 21 U.S.C. section 360bbb-3(b)(1), unless the authorization is terminated or revoked.  Performed at [facility], [address], [place], [place] [address]   Culture, blood (Routine X 2) w Reflex to ID Panel     Status: None   Collection Time: 11/07/20  3:28 PM   Specimen: BLOOD RIGHT FOREARM  Result Value Ref Range Status   Specimen Description   Final    BLOOD RIGHT FOREARM BOTTLES  DRAWN AEROBIC AND ANAEROBIC   Special Requests Blood Culture adequate volume  Final   Culture   Final    NO GROWTH 6 DAYS Performed at [facility], [address], [place], [place] [address]    Report Status 11/13/2020 FINAL  Final  Culture, blood (Routine X 2) w Reflex to ID Panel     Status: None   Collection Time: 11/07/20  3:59 PM   Specimen: Left Antecubital; Blood  Result Value Ref Range Status   Specimen Description LEFT ANTECUBITAL  Final   Special Requests   Final     BOTTLES DRAWN AEROBIC AND ANAEROBIC Blood Culture adequate volume   Culture   Final    NO GROWTH 6 DAYS Performed at [facility], [address], [place], [place] [address]    Report Status 11/13/2020 FINAL  Final  Urine Culture     Status: Abnormal   Collection Time: 11/07/20  5:00 PM   Specimen: Urine, Clean Catch  Result Value Ref Range Status   Specimen Description   Final    URINE, CLEAN CATCH Performed at [facility], [address] [place], [place] [address]    Special Requests   Final    NONE Performed at [facility], [address] [place], [place] [address]    Culture MULTIPLE SPECIES PRESENT, SUGGEST RECOLLECTION (A)  Final   Report Status 11/09/2020 FINAL  Final  Culture, Urine     Status: Abnormal   Collection Time: 11/08/20  3:51 PM   Specimen: Urine, Clean Catch  Result Value Ref Range Status   Specimen Description   Final    URINE, CLEAN CATCH Performed at [facility], [address] [place], [place] [address]    Special Requests   Final    NONE Performed at [facility], [address] [place], [place] [address]    Culture MULTIPLE SPECIES PRESENT, SUGGEST RECOLLECTION (A)  Final   Report Status 11/10/2020 FINAL  Final  MRSA PCR Screening     Status: Abnormal   Collection Time: 11/08/20  4:00 PM   Specimen: Nasopharyngeal  Result Value Ref Range Status   MRSA by PCR POSITIVE (A) NEGATIVE Final    Comment:        The GeneXpert MRSA Assay (FDA approved for NASAL specimens only), is one component of a comprehensive MRSA colonization surveillance program. It is not intended to diagnose MRSA infection nor to guide or monitor treatment for MRSA infections. RESULT CALLED TO, READ BACK BY AND VERIFIED WITH: K LOOMIS AT 1836 ON 11/08/2020 BY MOSLEY,J Performed at [facility], [address] [place], [place] [address]   Culture, Urine     Status: Abnormal   Collection Time: 11/13/20  5:36 PM   Specimen: Urine, Catheterized  Result Value Ref Range  Status   Specimen Description   Final    URINE, CATHETERIZED Performed at [facility], [address], [place], [place] [address]    Special Requests   Final    NONE Performed at [facility], [address], [place], [place] [address]    Culture (A)  Final    <10,000 COLONIES/mL INSIGNIFICANT GROWTH Performed at [facility], [address], [place], [place] [address]    Report Status 11/14/2020 FINAL  Final     Labs: Basic Metabolic Panel: Recent Labs  Lab 11/11/20 0404 11/12/20 0507 11/13/20 0537 11/14/20 0357 11/15/20 0521  NA 151* 145 140 136 137  K 3.5 3.7 4.1 4.2 4.5  CL  123* 119* 116* 114* 114*  CO2 21* 20* 19* 16* 17*  GLUCOSE 128* 111* 90 92 71  BUN 57* 40* 32* 29* 26*  CREATININE 2.14* 1.71* 1.56* 1.72* 1.58*  CALCIUM 7.9* 7.6* 7.9* 8.1* 8.4*  MG 2.6*  --  1.8  --  1.6*   Liver Function Tests: Recent Labs  Lab 11/10/20 0841 11/11/20 0404 11/12/20 0507 11/13/20 0537 11/15/20 0521  AST 56* 59* 51* 48* 43*  ALT ALKPHOS 61 61 59 61 66  BILITOT 2.2* 2.0*  2.1* 1.4* 1.4* 1.5*  PROT 5.9* 6.1* 5.8* 6.0* 6.0*  ALBUMIN 2.6* 2.5* 2.2* 2.2* 2.1*   No results for input(s): LIPASE, AMYLASE in the last 168 hours. No results for input(s): AMMONIA in the last 168 hours. CBC: Recent Labs  Lab 11/11/20 0404 11/12/20 0507 11/13/20 0537 11/14/20 0357 11/15/20 0521  WBC 6.7 7.4 7.2 8.3 8.8  HGB 9.8* 9.1* 9.4* 8.5* 9.2*  HCT 31.5* 28.5* 30.0* 27.7* 28.6*  MCV 102.9* 102.2* 102.4* 105.7* 99.7  PLT 121* 121* 135* 148* 177   Cardiac Enzymes: No results for input(s): CKTOTAL, CKMB, CKMBINDEX, TROPONINI in the last 168 hours. BNP: Invalid input(s): POCBNP CBG: Recent Labs  Lab 11/09/20 0735 11/10/20 1535  GLUCAP 113* 99    Time coordinating discharge:  36 minutes  Signed:  Catarina Hartshorn, DO [facility] Pager: [phone] 11/15/2020, 1:01 PM

## 2020-11-15 NOTE — TOC Transition Note (Deleted)
Transition of Care [facility]) - CM/SW Discharge Note   Patient Details  Name: Ryan Macdonald MRN: [id number] Date of Birth: 12-17-1933  [facility] CM/SW Contact:  Barry Brunner, LCSW Phone Number: 11/15/2020, 12:56 PM   Clinical Narrative:    CSW notified of patient's readiness for discharge and families request for home hospice. CSW contacted patient's spouse to identified preferred Hospice provider. Patient's wife agreeable to [facility] providers and requested hospital bed. CSW placed referral to [facility]. [facility] agreeable to take patient and provide hospital bed through [facility]. [place] also reported that patients Elquis will not be covered through hospice. CSW notified by patient's spouse that patient will not continue Eliquis upon discharge per MD. CSW notified [place].  CSW contacted EMS and completed med necessity form. TOC signing off.    Final next level of care: Home w Hospice Care Barriers to Discharge: Barriers Resolved   Patient Goals and CMS Choice Patient states their goals for this hospitalization and ongoing recovery are:: Return home with hospice CMS Medicare.gov Compare Post Acute Care list provided to:: Patient Choice offered to / list presented to : [facility]  Discharge Placement                Patient to be transferred to facility by: [facility] Name of family member notified: Gwynneth Macleod Patient and family notified of of transfer: 11/15/20  Discharge Plan and Services In-house Referral: Clinical Social Work              DME Arranged: Hospital bed DME Agency: Other - Comment,[facility] (Engineer, manufacturing systems) Date DME Agency Contacted: 11/15/20 Time DME Agency Contacted: 1254 Representative spoke with at DME Agency: Corrie Dandy with [facility] hospice placed order with [employer]            Social Determinants of Health (SDOH) Interventions      Readmission Risk Interventions Readmission Risk Prevention Plan 11/10/2020 09/25/2020  Transportation Screening Complete Complete  HRI or Home Care Consult - Complete  Social Work Consult for Recovery Care Planning/Counseling - Complete  Palliative Care Screening - Not Applicable  Medication Review Oceanographer) Complete Complete  PCP or Specialist appointment within 3-5 days of discharge Complete -  HRI or Home Care Consult Complete -  SW Recovery Care/Counseling Consult Complete -  Palliative Care Screening Complete -  Skilled Nursing Facility Complete -  Some recent data might be hidden

## 2020-11-15 NOTE — Progress Notes (Signed)
Civil engineer, contracting [facility]) Hospital Liaison: RN note     Notified by Transition of Care Manger Pervis Hocking CSW of patient/family request for [facility] services at home after discharge. Chart and patient information reviewed by [facility] physician. Hospice eligibility pending currently.     Writer spoke with wife to initiate education related to hospice philosophy, services and team approach to care. Wife  verbalized understanding of information given. Per discussion, plan is for discharge to home by   [facility].   Please send signed and completed DNR form home with patient/family. Patient will need prescriptions for discharge comfort medications. Also please send 30 day supply of Eliquis.     DME needs have been discussed, patient currently has the following equipment in the home: none .  Patient/family requests the following DME for delivery to the home: hospital bed. [facility] equipment manager has been notified and will contact DME provider to arrange delivery to the home. Home address has been verified and is correct in the chart. Wife  is the family member to contact to arrange time of delivery.    [facility] aware of the above. Please notify [facility] when patient is ready to leave the unit at discharge. (Call [phone] or [phone] after 5pm.) [facility] information and contact numbers given to Wife.       Please call with any hospice related questions.      Thank you for this referral.     Elsie Saas, RN, [facility] (listed on [facility] under Hospice [facility])   [phone]

## 2020-11-15 NOTE — Progress Notes (Signed)
Nsg Discharge Note  Admit Date:  11/07/2020 Discharge date: 11/15/2020   Herbert Deaner to be D/C'd Home per MD order.  AVS completed.  Copy for chart, and copy for patient signed, and dated. Patient/caregiver able to verbalize understanding. Patient transported by EMS. Both IV's removed. Patient alert and stable upon discharge. discharge paper work placed in packet for wife.  Signed DNR form placed in discharge packet.   Discharge Medication: Allergies as of 11/15/2020      Reactions   Lisinopril Swelling   Facial swelling   Xarelto [rivaroxaban] Other (See Comments)   "Blood came out of my penis"      Medication List    STOP taking these medications   amiodarone 100 MG tablet Commonly known as: PACERONE   apixaban 2.5 MG Tabs tablet Commonly known as: ELIQUIS   benzonatate 100 MG capsule Commonly known as: TESSALON   cyanocobalamin 1000 MCG tablet   finasteride 5 MG tablet Commonly known as: PROSCAR   HYDROcodone-acetaminophen 5-325 MG tablet Commonly known as: NORCO/VICODIN   multivitamin with minerals Tabs tablet   rosuvastatin 10 MG tablet Commonly known as: CRESTOR   spironolactone 25 MG tablet Commonly known as: ALDACTONE     TAKE these medications   acetaminophen 325 MG tablet Commonly known as: TYLENOL Take 650 mg by mouth every 6 (six) hours as needed.   amoxicillin 500 MG tablet Commonly known as: AMOXIL Take 2 tablets (1,000 mg total) by mouth 2 (two) times daily. Start taking on: November 22, 2020   aspirin EC 81 MG tablet Take 1 tablet (81 mg total) by mouth daily. Swallow whole.   cefdinir 300 MG capsule Commonly known as: OMNICEF Take 1 capsule (300 mg total) by mouth 2 (two) times daily.   clarithromycin 500 MG tablet Commonly known as: BIAXIN Take 1 tablet (500 mg total) by mouth 2 (two) times daily. Start taking on: November 22, 2020   diclofenac Sodium 1 % Gel Commonly known as: VOLTAREN Apply 2 g topically 4 (four) times  daily.   dronabinol 5 MG capsule Commonly known as: MARINOL Take 1 capsule (5 mg total) by mouth 2 (two) times daily before lunch and supper.   feeding supplement Liqd Take 237 mLs by mouth 2 (two) times daily between meals.   levothyroxine 125 MCG tablet Commonly known as: SYNTHROID Take 1 tablet (125 mcg total) by mouth daily before breakfast.   lidocaine 5 % Commonly known as: LIDODERM Place 1 patch onto the skin daily. Remove & Discard patch within 12 hours or as directed by MD   magnesium oxide 400 MG tablet Commonly known as: MAG-OX Take 400 mg by mouth daily.   metroNIDAZOLE 500 MG tablet Commonly known as: FLAGYL Take 1 tablet (500 mg total) by mouth every 8 (eight) hours.   ondansetron 4 MG tablet Commonly known as: ZOFRAN Take 1 tablet (4 mg total) by mouth every 6 (six) hours as needed for nausea.   pantoprazole 40 MG tablet Commonly known as: PROTONIX Take 1 tablet (40 mg total) by mouth 2 (two) times daily before a meal.   POLY-IRON 150 PO Take 1 capsule by mouth in the morning.   polyethylene glycol 17 g packet Commonly known as: MIRALAX / GLYCOLAX Take 17 g by mouth 2 (two) times daily.   saccharomyces boulardii 250 MG capsule Commonly known as: FLORASTOR Take 1 capsule (250 mg total) by mouth 2 (two) times daily.   sertraline 25 MG tablet Commonly known as: ZOLOFT Take  25 mg by mouth daily.   traZODone 50 MG tablet Commonly known as: DESYREL Take 0.5 tablets (25 mg total) by mouth at bedtime as needed for sleep.   Vitamin C 500 MG Caps Take 500 mg by mouth in the morning and at bedtime.   Zinc 50 MG Caps Take 50 mg by mouth daily.       Discharge Assessment: Vitals:   11/15/20 0417 11/15/20 1428  BP: 101/65 (!) 100/54  Pulse: 92 83  Resp: 18 16  Temp: 98.1 F (36.7 C) 98.9 F (37.2 C)  SpO2: 98% 99%   Skin clean, dry and intact without evidence of skin break down, no evidence of skin tears noted. IV catheter discontinued intact.  Site without signs and symptoms of complications - no redness or edema noted at insertion site, patient denies c/o pain - only slight tenderness at site.  Dressing with slight pressure applied.  D/c Instructions-Education: Discharge instructions given to patient/family with verbalized understanding. D/c education completed with patient/family including follow up instructions, medication list, d/c activities limitations if indicated, with other d/c instructions as indicated by MD - patient able to verbalize understanding, all questions fully answered. Patient instructed to return to ED, call 911, or call MD for any changes in condition.  Patient escorted via WC, and D/C home via private auto.  Lonn Georgia, RN 11/15/2020 5:24 PM

## 2020-11-17 ENCOUNTER — Telehealth: Payer: Self-pay | Admitting: Emergency Medicine

## 2020-11-17 ENCOUNTER — Telehealth (HOSPITAL_COMMUNITY): Payer: Self-pay | Admitting: Cardiology

## 2020-11-17 NOTE — Telephone Encounter (Signed)
Littie Deeds RN from [facility] called with request to disable tachy therapies. Patient's condition has declined with end stage CHF, a GI Bleed and altered LOC. Confirmed with his wife Ryan Macdonald that she wishes ICD therapies to be disabled. Order received from Dr Graciela Husbands to disable tachy therapies . [employer] representative notified and will coordinate with Architect. Patient address confirmed as:  [address] [place] , [place]

## 2020-11-17 NOTE — Telephone Encounter (Signed)
Raynelle Fanning with [facility] called to receive instructions on deactivating patients device  Cindy with the device clinic reports she will assist with getting message to Dr Marikay Alar, will speak directly to [facility] to facilitate

## 2020-11-24 ENCOUNTER — Telehealth (HOSPITAL_COMMUNITY): Payer: Self-pay

## 2020-11-24 NOTE — Telephone Encounter (Signed)
I had a VM from pts wife, [name] from a few days ago stating that pt had passed away at home around 3am. (that would be either 12/23 or 12/24) I couldn't make out the date. He was at home on hospice care.  When I called her back she was busy at funeral home with the arrangements.   [name], EMT-Paramedic  11/24/20

## 2020-11-29 DEATH — deceased

## 2020-12-18 LAB — CUP PACEART REMOTE DEVICE CHECK
Battery Voltage: 3.09 V
Brady Statistic RV Percent Paced: 0 %
Date Time Interrogation Session: 20220120163838
HighPow Impedance: 67 Ohm
Implantable Lead Implant Date: 20161221
Implantable Lead Location: 753860
Implantable Lead Model: 365
Implantable Lead Serial Number: 10600411
Implantable Pulse Generator Implant Date: 20161221
Lead Channel Impedance Value: 433 Ohm
Lead Channel Sensing Intrinsic Amplitude: 1.1 mV
Lead Channel Sensing Intrinsic Amplitude: 8.9 mV
Lead Channel Setting Pacing Amplitude: 2.5 V
Lead Channel Setting Pacing Pulse Width: 0.4 ms
Pulse Gen Model: 399436
Pulse Gen Serial Number: 60886010
